# Patient Record
Sex: Male | Born: 1970
Health system: Southern US, Community
[De-identification: ages and names within clinical notes are randomized; demographics above are authoritative.]

## PROBLEM LIST (undated history)

## (undated) DIAGNOSIS — R45851 Suicidal ideations: Secondary | ICD-10-CM

## (undated) DIAGNOSIS — Z992 Dependence on renal dialysis: Secondary | ICD-10-CM

## (undated) DIAGNOSIS — I10 Essential (primary) hypertension: Secondary | ICD-10-CM

## (undated) DIAGNOSIS — R748 Abnormal levels of other serum enzymes: Secondary | ICD-10-CM

## (undated) DIAGNOSIS — E1165 Type 2 diabetes mellitus with hyperglycemia: Secondary | ICD-10-CM

## (undated) DIAGNOSIS — I447 Left bundle-branch block, unspecified: Secondary | ICD-10-CM

## (undated) DIAGNOSIS — J189 Pneumonia, unspecified organism: Secondary | ICD-10-CM

## (undated) DIAGNOSIS — E785 Hyperlipidemia, unspecified: Secondary | ICD-10-CM

## (undated) DIAGNOSIS — E559 Vitamin D deficiency, unspecified: Secondary | ICD-10-CM

## (undated) DIAGNOSIS — F419 Anxiety disorder, unspecified: Secondary | ICD-10-CM

## (undated) DIAGNOSIS — N289 Disorder of kidney and ureter, unspecified: Secondary | ICD-10-CM

## (undated) DIAGNOSIS — G4700 Insomnia, unspecified: Secondary | ICD-10-CM

## (undated) DIAGNOSIS — N186 End stage renal disease: Secondary | ICD-10-CM

## (undated) HISTORY — DX: Essential (primary) hypertension: I10

## (undated) HISTORY — PX: OTHER SURGICAL HISTORY: SHX169

## (undated) HISTORY — DX: Hyperlipidemia, unspecified: E78.5

## (undated) HISTORY — DX: Type 2 diabetes mellitus with hyperglycemia: E11.65

---
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
## 1998-11-16 ENCOUNTER — Emergency Department (HOSPITAL_COMMUNITY): Admission: EM | Admit: 1998-11-16 | Discharge: 1998-11-16 | Payer: Self-pay | Admitting: Emergency Medicine

## 1998-11-21 ENCOUNTER — Emergency Department (HOSPITAL_COMMUNITY): Admission: EM | Admit: 1998-11-21 | Discharge: 1998-11-21 | Payer: Self-pay | Admitting: Emergency Medicine

## 2002-08-05 ENCOUNTER — Emergency Department (HOSPITAL_COMMUNITY): Admission: EM | Admit: 2002-08-05 | Discharge: 2002-08-05 | Payer: Self-pay | Admitting: Emergency Medicine

## 2002-08-05 ENCOUNTER — Encounter: Payer: Self-pay | Admitting: Emergency Medicine

## 2003-09-19 ENCOUNTER — Emergency Department (HOSPITAL_COMMUNITY): Admission: EM | Admit: 2003-09-19 | Discharge: 2003-09-20 | Payer: Self-pay | Admitting: *Deleted

## 2003-11-06 ENCOUNTER — Emergency Department (HOSPITAL_COMMUNITY): Admission: EM | Admit: 2003-11-06 | Discharge: 2003-11-07 | Payer: Self-pay | Admitting: Emergency Medicine

## 2003-11-16 ENCOUNTER — Encounter: Admission: RE | Admit: 2003-11-16 | Discharge: 2003-11-16 | Payer: Self-pay | Admitting: Internal Medicine

## 2003-11-30 ENCOUNTER — Encounter: Admission: RE | Admit: 2003-11-30 | Discharge: 2003-11-30 | Payer: Self-pay | Admitting: Internal Medicine

## 2003-12-14 ENCOUNTER — Encounter: Admission: RE | Admit: 2003-12-14 | Discharge: 2003-12-14 | Payer: Self-pay | Admitting: Internal Medicine

## 2004-01-24 ENCOUNTER — Ambulatory Visit: Payer: Self-pay | Admitting: Internal Medicine

## 2004-02-14 ENCOUNTER — Ambulatory Visit: Payer: Self-pay | Admitting: Internal Medicine

## 2004-03-13 ENCOUNTER — Ambulatory Visit: Payer: Self-pay | Admitting: Internal Medicine

## 2004-03-29 ENCOUNTER — Ambulatory Visit: Payer: Self-pay | Admitting: Internal Medicine

## 2004-04-01 ENCOUNTER — Emergency Department (HOSPITAL_COMMUNITY): Admission: EM | Admit: 2004-04-01 | Discharge: 2004-04-01 | Payer: Self-pay | Admitting: Emergency Medicine

## 2004-04-03 ENCOUNTER — Emergency Department (HOSPITAL_COMMUNITY): Admission: EM | Admit: 2004-04-03 | Discharge: 2004-04-03 | Payer: Self-pay | Admitting: Family Medicine

## 2004-04-06 ENCOUNTER — Ambulatory Visit: Payer: Self-pay | Admitting: Internal Medicine

## 2004-04-20 ENCOUNTER — Ambulatory Visit: Payer: Self-pay | Admitting: Internal Medicine

## 2004-05-28 ENCOUNTER — Ambulatory Visit: Payer: Self-pay | Admitting: Internal Medicine

## 2004-06-04 ENCOUNTER — Ambulatory Visit: Payer: Self-pay | Admitting: Internal Medicine

## 2004-06-11 ENCOUNTER — Ambulatory Visit: Payer: Self-pay | Admitting: Internal Medicine

## 2004-07-24 ENCOUNTER — Ambulatory Visit: Payer: Self-pay | Admitting: Internal Medicine

## 2004-08-06 ENCOUNTER — Ambulatory Visit: Payer: Self-pay | Admitting: Internal Medicine

## 2004-08-23 ENCOUNTER — Ambulatory Visit: Payer: Self-pay | Admitting: Internal Medicine

## 2004-09-24 ENCOUNTER — Emergency Department (HOSPITAL_COMMUNITY): Admission: EM | Admit: 2004-09-24 | Discharge: 2004-09-24 | Payer: Self-pay | Admitting: Family Medicine

## 2004-10-24 ENCOUNTER — Ambulatory Visit: Payer: Self-pay | Admitting: Internal Medicine

## 2005-01-19 ENCOUNTER — Emergency Department (HOSPITAL_COMMUNITY): Admission: EM | Admit: 2005-01-19 | Discharge: 2005-01-19 | Payer: Self-pay | Admitting: Emergency Medicine

## 2005-01-31 ENCOUNTER — Ambulatory Visit: Payer: Self-pay | Admitting: Internal Medicine

## 2005-02-01 ENCOUNTER — Ambulatory Visit: Payer: Self-pay | Admitting: Internal Medicine

## 2005-06-04 ENCOUNTER — Emergency Department (HOSPITAL_COMMUNITY): Admission: EM | Admit: 2005-06-04 | Discharge: 2005-06-04 | Payer: Self-pay | Admitting: Emergency Medicine

## 2005-07-26 ENCOUNTER — Emergency Department (HOSPITAL_COMMUNITY): Admission: EM | Admit: 2005-07-26 | Discharge: 2005-07-26 | Payer: Self-pay | Admitting: Emergency Medicine

## 2006-07-17 ENCOUNTER — Encounter (INDEPENDENT_AMBULATORY_CARE_PROVIDER_SITE_OTHER): Payer: Self-pay | Admitting: Unknown Physician Specialty

## 2006-07-17 ENCOUNTER — Ambulatory Visit: Payer: Self-pay | Admitting: Hospitalist

## 2006-07-17 DIAGNOSIS — E1165 Type 2 diabetes mellitus with hyperglycemia: Secondary | ICD-10-CM

## 2006-07-17 DIAGNOSIS — N186 End stage renal disease: Secondary | ICD-10-CM | POA: Diagnosis present

## 2006-07-17 DIAGNOSIS — IMO0002 Reserved for concepts with insufficient information to code with codable children: Secondary | ICD-10-CM

## 2006-07-17 DIAGNOSIS — I10 Essential (primary) hypertension: Secondary | ICD-10-CM | POA: Diagnosis present

## 2006-07-17 DIAGNOSIS — Z992 Dependence on renal dialysis: Secondary | ICD-10-CM | POA: Diagnosis present

## 2006-07-17 DIAGNOSIS — E1142 Type 2 diabetes mellitus with diabetic polyneuropathy: Secondary | ICD-10-CM | POA: Insufficient documentation

## 2006-07-17 DIAGNOSIS — I152 Hypertension secondary to endocrine disorders: Secondary | ICD-10-CM | POA: Diagnosis present

## 2006-07-17 DIAGNOSIS — E119 Type 2 diabetes mellitus without complications: Secondary | ICD-10-CM

## 2006-07-17 HISTORY — DX: Type 2 diabetes mellitus with hyperglycemia: E11.65

## 2006-07-17 HISTORY — DX: Reserved for concepts with insufficient information to code with codable children: IMO0002

## 2006-07-17 HISTORY — DX: Essential (primary) hypertension: I10

## 2006-07-17 LAB — CONVERTED CEMR LAB
ALT: 18 units/L (ref 0–53)
AST: 18 units/L (ref 0–37)
Albumin: 4.3 g/dL (ref 3.5–5.2)
Alkaline Phosphatase: 127 units/L — ABNORMAL HIGH (ref 39–117)
BUN: 7 mg/dL (ref 6–23)
Blood Glucose, Fingerstick: 379
CO2: 26 meq/L (ref 19–32)
Calcium: 9.1 mg/dL (ref 8.4–10.5)
Chloride: 101 meq/L (ref 96–112)
Creatinine, Ser: 0.86 mg/dL (ref 0.40–1.50)
Glucose, Bld: 285 mg/dL — ABNORMAL HIGH (ref 70–99)
Hgb A1c MFr Bld: >14 %
Potassium: 4.1 meq/L (ref 3.5–5.3)
Sodium: 137 meq/L (ref 135–145)
Total Bilirubin: 0.3 mg/dL (ref 0.3–1.2)
Total Protein: 7.5 g/dL (ref 6.0–8.3)

## 2007-02-04 ENCOUNTER — Emergency Department (HOSPITAL_COMMUNITY): Admission: EM | Admit: 2007-02-04 | Discharge: 2007-02-04 | Payer: Self-pay | Admitting: Emergency Medicine

## 2007-05-25 ENCOUNTER — Emergency Department (HOSPITAL_COMMUNITY): Admission: EM | Admit: 2007-05-25 | Discharge: 2007-05-25 | Payer: Self-pay | Admitting: Emergency Medicine

## 2007-06-04 ENCOUNTER — Encounter (INDEPENDENT_AMBULATORY_CARE_PROVIDER_SITE_OTHER): Payer: Self-pay | Admitting: *Deleted

## 2007-06-04 ENCOUNTER — Ambulatory Visit: Payer: Self-pay | Admitting: Internal Medicine

## 2007-06-04 LAB — CONVERTED CEMR LAB
ALT: 18 units/L (ref 0–53)
AST: 19 units/L (ref 0–37)
Albumin: 4 g/dL (ref 3.5–5.2)
Alkaline Phosphatase: 107 units/L (ref 39–117)
BUN: 11 mg/dL (ref 6–23)
Blood Glucose, Fingerstick: 378
CO2: 23 meq/L (ref 19–32)
Calcium: 8.9 mg/dL (ref 8.4–10.5)
Chloride: 105 meq/L (ref 96–112)
Creatinine, Ser: 0.88 mg/dL (ref 0.40–1.50)
Creatinine, Urine: 55.9 mg/dL
Glucose, Bld: 285 mg/dL — ABNORMAL HIGH (ref 70–99)
Hgb A1c MFr Bld: 13.2 %
Microalb Creat Ratio: 3.6 mg/g (ref 0.0–30.0)
Microalb, Ur: 0.2 mg/dL (ref 0.00–1.89)
Potassium: 4.1 meq/L (ref 3.5–5.3)
Sodium: 139 meq/L (ref 135–145)
Total Bilirubin: 0.4 mg/dL (ref 0.3–1.2)
Total Protein: 7.2 g/dL (ref 6.0–8.3)

## 2007-07-22 ENCOUNTER — Emergency Department (HOSPITAL_COMMUNITY): Admission: EM | Admit: 2007-07-22 | Discharge: 2007-07-22 | Payer: Self-pay | Admitting: Emergency Medicine

## 2007-09-05 ENCOUNTER — Emergency Department (HOSPITAL_COMMUNITY): Admission: EM | Admit: 2007-09-05 | Discharge: 2007-09-05 | Payer: Self-pay | Admitting: Emergency Medicine

## 2008-04-21 ENCOUNTER — Ambulatory Visit: Payer: Self-pay | Admitting: Internal Medicine

## 2008-04-21 ENCOUNTER — Encounter (INDEPENDENT_AMBULATORY_CARE_PROVIDER_SITE_OTHER): Payer: Self-pay | Admitting: *Deleted

## 2008-04-21 LAB — CONVERTED CEMR LAB
ALT: 13 units/L (ref 0–53)
AST: 12 units/L (ref 0–37)
Albumin: 4.3 g/dL (ref 3.5–5.2)
Alkaline Phosphatase: 120 units/L — ABNORMAL HIGH (ref 39–117)
BUN: 11 mg/dL (ref 6–23)
Blood Glucose, Fingerstick: 338
CO2: 23 meq/L (ref 19–32)
Calcium: 9.5 mg/dL (ref 8.4–10.5)
Chloride: 101 meq/L (ref 96–112)
Cholesterol: 207 mg/dL — ABNORMAL HIGH (ref 0–200)
Creatinine, Ser: 0.9 mg/dL (ref 0.40–1.50)
Creatinine, Urine: 79.3 mg/dL
Glucose, Bld: 352 mg/dL — ABNORMAL HIGH (ref 70–99)
HDL: 33 mg/dL — ABNORMAL LOW (ref >39)
Hgb A1c MFr Bld: >14 %
LDL Cholesterol: 117 mg/dL — ABNORMAL HIGH (ref 0–99)
Microalb Creat Ratio: 6.6 mg/g (ref 0.0–30.0)
Microalb, Ur: 0.52 mg/dL (ref 0.00–1.89)
Potassium: 4.3 meq/L (ref 3.5–5.3)
Sodium: 139 meq/L (ref 135–145)
Total Bilirubin: 0.5 mg/dL (ref 0.3–1.2)
Total CHOL/HDL Ratio: 6.3
Total Protein: 8 g/dL (ref 6.0–8.3)
Triglycerides: 284 mg/dL — ABNORMAL HIGH (ref <150)
VLDL: 57 mg/dL — ABNORMAL HIGH (ref 0–40)

## 2008-06-14 ENCOUNTER — Emergency Department (HOSPITAL_COMMUNITY): Admission: EM | Admit: 2008-06-14 | Discharge: 2008-06-14 | Payer: Self-pay | Admitting: Emergency Medicine

## 2008-06-15 ENCOUNTER — Other Ambulatory Visit: Payer: Self-pay

## 2008-06-15 ENCOUNTER — Ambulatory Visit: Payer: Self-pay | Admitting: *Deleted

## 2008-06-15 ENCOUNTER — Other Ambulatory Visit: Payer: Self-pay | Admitting: Emergency Medicine

## 2008-06-15 ENCOUNTER — Observation Stay (HOSPITAL_COMMUNITY): Admission: AD | Admit: 2008-06-15 | Discharge: 2008-06-16 | Payer: Self-pay | Admitting: *Deleted

## 2008-09-26 ENCOUNTER — Encounter: Payer: Self-pay | Admitting: Internal Medicine

## 2008-11-24 ENCOUNTER — Encounter: Payer: Self-pay | Admitting: Internal Medicine

## 2008-11-24 ENCOUNTER — Ambulatory Visit: Payer: Self-pay | Admitting: Infectious Diseases

## 2008-11-24 DIAGNOSIS — E785 Hyperlipidemia, unspecified: Secondary | ICD-10-CM

## 2008-11-24 HISTORY — DX: Hyperlipidemia, unspecified: E78.5

## 2008-11-24 LAB — CONVERTED CEMR LAB
BUN: 7 mg/dL (ref 6–23)
CO2: 24 meq/L (ref 19–32)
Calcium: 9.3 mg/dL (ref 8.4–10.5)
Chloride: 90 meq/L — ABNORMAL LOW (ref 96–112)
Creatinine, Ser: 1.09 mg/dL (ref 0.40–1.50)
Glucose, Bld: 705 mg/dL (ref 70–99)
Hgb A1c MFr Bld: >14 %
Potassium: 3.9 meq/L (ref 3.5–5.3)
Sodium: 129 meq/L — ABNORMAL LOW (ref 135–145)

## 2008-12-16 ENCOUNTER — Emergency Department (HOSPITAL_COMMUNITY): Admission: EM | Admit: 2008-12-16 | Discharge: 2008-12-16 | Payer: Self-pay | Admitting: Emergency Medicine

## 2009-08-30 ENCOUNTER — Ambulatory Visit: Payer: Self-pay | Admitting: Internal Medicine

## 2010-01-11 ENCOUNTER — Emergency Department (HOSPITAL_COMMUNITY): Admission: EM | Admit: 2010-01-11 | Discharge: 2010-01-11 | Payer: Self-pay | Admitting: Family Medicine

## 2010-03-06 ENCOUNTER — Inpatient Hospital Stay (HOSPITAL_COMMUNITY)
Admission: EM | Admit: 2010-03-06 | Discharge: 2010-03-09 | Payer: Self-pay | Source: Home / Self Care | Admitting: Emergency Medicine

## 2010-06-19 NOTE — Assessment & Plan Note (Signed)
Summary: DIABETES/ SB.   Vital Signs:  Patient Profile:   40 Years Old Male Height:     67 inches (170.18 cm) Weight:      181.23 pounds (82.38 kg) BMI:     27.76 Temp:     98.1 degrees F (36.Douglas degrees C) oral Pulse rate:   80 / minute BP sitting:   119 / 74  (right arm)  Pt. in pain?   no  Vitals Entered By: Mollie Germany (June 04, 2007 10:15 AM)              Is Patient Diabetic? Yes  Nutritional Status BMI of 25 - 29 = overweight CBG Result 378  Does patient need assistance? Functional Status Self care Ambulation Normal     PCP:  Mohammed Kindle MD  Chief Complaint:  DM follow up and discuss meds.  History of Present Illness: [name] with DM presents for follow-up.  Last seen a year ago in our clinic.  He is unsure of when he was dx (says it was either 2003 or 2006).  Ran out of his oral meds more than 9-10 months ago.  Since that time, has been self-medicating with his wife's insulin using a sliding scale that he adapted from her physician's instructions.  He checks CBGs 3-4x/day using her meter and test strips.  Reports that his sugars are usually 300 to 400, but sometimes run in the high-100s or 200s.  Denies hypoglycemic episodes, but says that recently he was "really hungry," came home from school, and checked his sugar and found it to be 94.  No sweats/palpitations.  Denies CP, N/V/D, dyspnea, or abd pain. Feels well today.  Says he wants to get reconnected with medical care given the recent new year.  Denies HA, change in vision/blurriness, or numbness/tingling in his arms, legs, hands, or feet.  The pt does not believe he has ever had an eye exam.     Family History:    Mother: living, 81 - HTN, bladder cancer    Father: deceased, alcohol    Siblings: 3 brothers (1 with HTN), no sisters    Children: healthy  Social History:    Lives with wife in [place].  Is a cook.  Wife recently had foot amputation; is a type 1 DM.  Never smoked.  No alcohol.  No  drugs.   Risk Factors: Tobacco use:  never   Review of Systems  The patient denies anorexia, fever, weight loss, chest pain, syncope, dyspnea on exhertion, peripheral edema, prolonged cough, hemoptysis, abdominal pain, melena, hematochezia, severe indigestion/heartburn, hematuria, difficulty walking, and unusual weight change.     Physical Exam  General:     Alert, well-developed, well-nourished African-American man in no acute distress. Aqsa Sensabaugh:     Normocephalic and atraumatic. Eyes:     PERRLA.  EOMI.  Sclerae anicteric.  No conjunctival injection.  Fundoscopy limited 2/2 pupillary constriction. Mouth:     Good dentition.  Oropharynx clear, pink, and moist. Lungs:     Clear to auscultation bilaterally without wheezes, rales, or rhonchi. Heart:     Regular rate and rhythm without murmurs, rubs, or gallops. Abdomen:     Soft, non-tender, non-distended with normoactive bowel sounds.   Extremities:     No cyanosis or edema. Neurologic:     A&Ox3.  CN grossly intact.  Strength 5/5 in all extremities.  Sensation intact throughout upper and lower extremities to light touch.  No reported paresthesias/numbness/tingling. Skin:  Normal color and turgor. Psych:     Poor eye contact.  Somewhat withdrawn, but cooperative.  Limited insight into nature of his disease.    Impression & Recommendations:  Problem # 1:  DM (ICD-250.00) Very poor control with HgbA1c of 13.2%.  I had a lengthy discussion with the pt about his disease, importance of aggressively managing blood sugars, and obtaining his own insulin and testing supplies.  This raises some difficult medical/ethical issues, as he has access to very inexpensive supplies (his wife's, who has Medicaid) and I am unsure if he will be able to afford (or choose to pay for) the insulin, supplies, glucometer, and test strips that I wrote for.  I encouraged him to do so, however, since he needs his own supplies to personalize this disease and  help him take responsbility for his own care.  He has been using insulin for about 1 year now, and prefers it to oral meds, so we will try 70/30 (only because it is a cheaper alternative to Lantus).  I will see him back in 1-2 weeks to monitor his sugars and adjust his insulin as needed.  The "red flag" signs/sx of both hypoglycemia and DKA were discussed.  The following medications were removed from the medication list:    Metformin Hcl 500 Mg Tabs (Metformin hcl) .Marland Kitchen... Take 1 tablet by mouth two times a day    Glucotrol 10 Mg Tabs (Glipizide) .Marland Kitchen... Take 1 tablet by mouth two times a day    Lisinopril 20 Mg Tabs (Lisinopril) .Marland Kitchen... Take 1 tablet by mouth once a day  His updated medication list for this problem includes:    Novolin 70/30 70-30 % Susp (Insulin isophane & regular) ..... Inject 20 units in the morning before breakfast and 20 units in the evening before dinner.  Orders: T-Hgb A1C (in-house) [id number]) T- Capillary Blood Glucose [id number]) T-Urine Microalbumin w/creat. ratio [id number] / [id number]) T-Comprehensive Metabolic Panel (A999333) Ophthalmology Referral (Ophthalmology)  Labs Reviewed: HgBA1c: 13.2 (06/04/2007)   Creat: 0.86 (07/17/2006)      Problem # 2:  ESSENTIAL HYPERTENSION (ICD-401.9) The pt has documented proteinuria previously, but his BP today is at goal (<130/80).  Will hold on restarting lisinopril today, as pt "can only handle so many medicines" [at one time].  Will recheck BP at next visit and consider starting low-dose lisinopril if BP can tolerate.  The following medications were removed from the medication list:    Lisinopril 20 Mg Tabs (Lisinopril) .Marland Kitchen... Take 1 tablet by mouth once a day  Orders: T-Urine Microalbumin w/creat. ratio [id number] / [id number]) T-Comprehensive Metabolic Panel (A999333)   Complete Medication List: 1)  Novolin 70/30 70-30 % Susp (Insulin isophane & regular) .... Inject 20 units in the morning before breakfast and 20 units  in the evening before dinner.   Patient Instructions: 1)  Please make a follow-up appointment in 2-3 weeks. 2)  Please continue to keep track of your blood sugars as you have now been started on insulin. 3)  If you have problems with low blood sugar (sweating, excessive hunger, shaking), please call us. 4)  If you develop nausea, vomiting, or pain in your stomach, please call us (if during the day) or come to the Emergency Room (if after hours).    Prescriptions: NOVOLIN 70/30 70-30 %  SUSP (INSULIN ISOPHANE & REGULAR) Inject 20 units in the morning before breakfast and 20 units in the evening before dinner.  #2 vials x 11   Entered and Authorized by:  Dorina Hoyer MD   Signed by:   Dorina Hoyer MD on 06/04/2007   Method used:   Print then Give to Patient   RxID:   [id number]  ] Laboratory Results   Blood Tests   Date/Time Recieved: June 04, 2007 10:30 AM Date/Time Reported: ..................................................................Marland KitchenMaryan Rued  June 04, 2007 10:30 AM  HGBA1C: 13.2%   (Normal Range: Non-Diabetic - 3-6%   Control Diabetic - 6-8%) CBG Random: 378

## 2010-06-19 NOTE — Letter (Signed)
Summary: Diabetic Foot Exam  Diabetic Foot Exam   Imported By: Ollen Bowl 07/23/2006 10:27:06  _____________________________________________________________________  External Attachment:    Type:   Image     Comment:   External Document

## 2010-06-19 NOTE — Miscellaneous (Signed)
Summary: HIPAA Restrictions  HIPAA Restrictions   Imported By: Bonner Puna 11/24/2008 16:28:55  _____________________________________________________________________  External Attachment:    Type:   Image     Comment:   External Document

## 2010-06-19 NOTE — Assessment & Plan Note (Signed)
Summary: EYES / SUGAR IS HIGH/ SB.   Vital Signs:  Patient Profile:   40 Years Old Male Weight:      175.4 pounds (79.73 kg) Temp:     98.2 degrees F (36.78 degrees C) oral Pulse rate:   86 / minute BP sitting:   136 / 82  (right arm)  Pt. in pain?   no  Vitals Entered By: Hilda Blades Ditzler RN (July 17, 2006 3:05 PM)              Is Patient Diabetic? Yes  Nutritional Status Normal Nutritional Status Detail good CBG Result 379  Have you ever been in a relationship where you felt threatened, hurt or afraid?denies   Does patient need assistance? Functional Status Self care Ambulation Normal   PCP:  [id number]  Chief Complaint:  c/o of "eye h/a"off and on for past 3-4 months.  History of Present Illness: Pt is a 40yr old gentleman with h/o DM, HTN and non-compliance. Pt is here in the clinic after 39mths. He takes his meds sporadically. He is on Glucophage and Glucotrol for DM and Natasha in the clinic has gotten his meds, but he did not come to pick it up. He says he has ran out of Lisinopril 1 mth ago. He says 2 days ago he had problem with his eyes, like hazy, and felt tired. He does not check his sugar at home. He has h/o polyuria, polydypsia and nocturia. No CP, SOB, HA, focal weakness, dysuria.  Prior Medications: METFORMIN HCL 500 MG TABS (METFORMIN HCL) Take 1 tablet by mouth two times a day GLUCOTROL 10 MG TABS (GLIPIZIDE) Take 1 tablet by mouth two times a day LISINOPRIL 20 MG TABS (LISINOPRIL) Take 1 tablet by mouth once a day     Risk Factors:  Tobacco use:  never    Physical Exam  General:     alert, well-developed, well-nourished, and well-hydrated.   Head:     normocephalic.   Eyes:     No corneal or conjunctival inflammation noted. EOMI. Perrla. Funduscopic exam benign, without hemorrhages, exudates or papilledema. Vision grossly normal. Mouth:     fair dentition and halitosis.   Neck:     supple and no thyromegaly.   Lungs:     normal  respiratory effort, normal breath sounds, no crackles, and no wheezes.   Heart:     normal rate, regular rhythm, no murmur, and no gallop.   Abdomen:     soft, non-tender, and normal bowel sounds.   Extremities:     No clubbing, cyanosis, edema, or deformity noted with normal full range of motion of all joints.   +2 pulses, sensation intact. Neurologic:     Non-focal    Impression & Recommendations:  Problem # 1:  DM (ICD-250.00) CBG 379 and HbA1c >14, it was 10.7 in 2006. Pt has not been seen since then. He says he has abdominal pain when he takes his meds so would like new meds. I explained to him that if he takes them regularly, he might be able to wear of the side effects which are seen by Glucophage in early period. He understands and says will try to take them reg. I explained the consequences of uncontrolled DM. Orders: T- Capillary Blood Glucose [id number]) T-Hgb A1C (in-house) [id number]) T-Comprehensive Metabolic Panel (A999333)  His updated medication list for this problem includes:    Metformin Hcl 500 Mg Tabs (Metformin hcl) .Marland Kitchen... Take 1 tablet by mouth two times a  day    Glucotrol 10 Mg Tabs (Glipizide) .Marland Kitchen... Take 1 tablet by mouth two times a day    Lisinopril 20 Mg Tabs (Lisinopril) .Marland Kitchen... Take 1 tablet by mouth once a day  CMET normal.   Medications Added to Medication List This Visit: 1)  Metformin Hcl 500 Mg Tabs (Metformin hcl) .... Take 1 tablet by mouth two times a day 2)  Glucotrol 10 Mg Tabs (Glipizide) .... Take 1 tablet by mouth two times a day 3)  Lisinopril 20 Mg Tabs (Lisinopril) .... Take 1 tablet by mouth once a day   Patient Instructions: 1)  Limit intake of Sodium (Salt). 2)  Please schedule a follow-up appointment in 2 weeks. 3)  Diabetes can make you handicap, destroy your kidnesys, heart and brain. It can also affect your vision by giving blood in your eyes. So it is important you take your med. 4)  Keep your apt with Barnabas Harries.  Laboratory  Results   Blood Tests   Date/Time Recieved: July 17, 2006 3:27 PM  Date/Time Reported: July 17, 2006 3:27 PM ..................................................................Marland KitchenMelvia Heaps  July 17, 2006 3:27 PM   HGBA1C: >14.0%   (Normal Range: Non-Diabetic - 3-6%   Control Diabetic - 6-8%) CBG Random: 379

## 2010-06-19 NOTE — Assessment & Plan Note (Signed)
Summary: diabetes check/up/cfb   Primary Care Provider:  Geanie Kenning MD   History of Present Illness: pt came late by 2 hrs to clinic appointment and then disappeared from waiting area when asked to wait until I am done with other pts.   Allergies: No Known Drug Allergies   Complete Medication List: 1)  Novolin 70/30 70-30 % Susp (Insulin isophane & regular) .... Inject 20 units in the morning before breakfast and 20 units in the evening before dinner. 2)  Pravastatin Sodium 10 Mg Tabs (Pravastatin sodium) .... Take 1 tablet by mouth once a day

## 2010-06-19 NOTE — Assessment & Plan Note (Signed)
Summary: ACUTE-DIABETES CK NEEDING MEDS/CFB   Vital Signs:  Patient Profile:   40 Years Old Male Height:     67 inches (170.18 cm) Weight:      166.6 pounds (75.73 kg) BMI:     26.19 Temp:     97.9 degrees F (36.61 degrees C) oral Pulse rate:   73 / minute BP sitting:   110 / 78  (left arm) Cuff size:   large  Pt. in pain?   no  Vitals Entered By: Nadine Counts Deborra Medina) (April 21, 2008 9:51 AM)              Is Patient Diabetic? Yes Did you bring your meter with you today? No Nutritional Status BMI of 25 - 29 = overweight CBG Result 338  Have you ever been in a relationship where you felt threatened, hurt or afraid?No   Does patient need assistance? Functional Status Self care Ambulation Normal     PCP:  Mohammed Kindle MD  Chief Complaint:  f/u diabetes.  History of Present Illness: Pt is 40 yo with terribly uncontrolled diabetes, diagnosed  ~ 3 y ago, and severe medical non-compliance. Pt reports having run out of insulin about 2-3 months ago. When he did have his insulin, he attempted some sort of sliding scale dosing in which he took up to 50 units of his 70/30 at a time. However, it sounds like he usually only dosed himself once a day.  Pt reports taking BS 2x day fairly regularly using wife's testing equipment as she is also a diabetic.  He did not bring any of his BS recordings with him. He reports that while on the insulin, his pre-prandial BS would range from 200s to 400s. Not currently having any symptoms related to hyperglycemia and could not remember a hypoglycemic spell.     Current Allergies (reviewed today): No known allergies     Risk Factors: Tobacco use:  never    Physical Exam  General:     alert and well-developed.   Head:     normocephalic and atraumatic.   Lungs:     normal respiratory effort and no accessory muscle use.   Extremities:     no foot ulcers or skin breakdown on feet.  Neurologic:     alert & oriented X3.     Diabetes Management Exam:    Foot Exam (with socks and/or shoes not present):       Sensory-Monofilament:          Left foot: normal          Right foot: normal    Impression & Recommendations:  Problem # 1:  DM (ICD-250.00) Severely uncontrolled. D/w pt at length about potential complications including renal, CV, infections, neuropathic that would likely befall him if he does not make strides toward adequately controlling his diabetes. Also informed him that this disease would likely shorten his life span significantly. I believe that he understood what I was saying. D/w pt also need to just take 20 units in am and pm and record BS at least twice a day each and every day so that we would have an adequate baseline to make further adjustments to his regimen.   We scheduled him to f/u in two weeks after he has restarted insulin and has recorded his CBGs. We also set him up for an Opth appt for a diabetic eye exam and to meet with Barnabas Harries. We Also had Natasha visit pt to help  him obtain insulin. She reports that she has attempted to work with him on multiple occasions in the past, however, he has been non-compliant.   We also risk stratified him by obtaining an FLP and checked a CMET to evaluate for any renal or liver dysfunction. We will also check his Cr/ microalbumin ratio.   His updated medication list for this problem includes:    Novolin 70/30 70-30 % Susp (Insulin isophane & regular) ..... Inject 20 units in the morning before breakfast and 20 units in the evening before dinner.  Orders: T-Hgb A1C (in-house) [id number]) T- Capillary Blood Glucose [id number]) T-Lipid Profile [id number]) T-Comprehensive Metabolic Panel (A999333) T-Urine Microalbumin w/creat. ratio [id number] / [id number])  Labs Reviewed: HgBA1c: >14.0 (04/21/2008)   Creat: 0.88 (06/04/2007)   Microalbumin: 0.20 (06/04/2007)   Problem # 2:  ESSENTIAL HYPERTENSION (ICD-401.9) BP at goal on no medications. Will  continue to follow.   BP today: 110/78 Prior BP: 119/74 (06/04/2007)  Labs Reviewed: Creat: 0.88 (06/04/2007)   Complete Medication List: 1)  Novolin 70/30 70-30 % Susp (Insulin isophane & regular) .... Inject 20 units in the morning before breakfast and 20 units in the evening before dinner.  Other Orders: Diabetic Clinic Referral (Diabetic) Ophthalmology Referral (Ophthalmology)   Patient Instructions: 1)  Please schedule a follow-up appointment in 2 weeks.  2)  Please record your blood sugars twice a day for each day over the next two weeks. Please just take 20 units of your insulin in the morning and evening as directed. We need you to strictly follow this so that we can know how much we need to adjust your insulin. 3)  We will let you know of your blood work results by phone.   Prescriptions: NOVOLIN 70/30 70-30 %  SUSP (INSULIN ISOPHANE & REGULAR) Inject 20 units in the morning before breakfast and 20 units in the evening before dinner.  #2 vials x 11   Entered and Authorized by:   Darlyne Russian MD   Signed by:   Darlyne Russian MD on 04/21/2008   Method used:   Print then Give to Patient   [id number]  ]         Diabetic Foot Exam Foot Inspection Is there a history of a foot ulcer?              No Is there a foot ulcer now?              No Can the patient see the bottom of their feet?          Yes Is there swelling or an abnormal foot shape?          No Are the toenails long?                No Are the toenails thick?                No Are the toenails ingrown?              No Is there heavy callous build-up?              No Is there pain in the calf muscle (Intermittent claudication) when walking?    No Diabetic Foot Care Education Patient educated on appropriate care of diabetic feet.     10-g (5.07) Semmes-Weinstein Monofilament Test Performed by: Maxine Glenn          Right Foot          Left  Foot Site 1         normal         normal Site 4          normal         normal Site 5         normal         normal Site 6         normal         normal  Impression      normal         normal   Laboratory Results   Blood Tests   Date/Time Received: April 21, 2008 10:07 AM. Date/Time Reported: Maryan Rued  April 21, 2008 10:07 AM  HGBA1C: >14.0%   (Normal Range: Non-Diabetic - 3-6%   Control Diabetic - 6-8%) CBG Random:: 338mg /dL

## 2010-06-19 NOTE — Assessment & Plan Note (Signed)
Summary: ACUTE-DIABETES CHECK UP/CFB   Vital Signs:  Patient profile:   40 year old male Height:      67.5 inches (171.45 cm) Weight:      169.0 pounds (76.82 kg) BMI:     26.17 Temp:     97.1 degrees F (36.17 degrees C) Pulse rate:   91 / minute BP sitting:   135 / 84  (right arm)  Vitals Entered By: Nadine Counts Deborra Medina) (November 24, 2008 1:27 PM) CC: med refill, f/u DM Is Patient Diabetic? Yes  Pain Assessment Patient in pain? no      Nutritional Status BMI of 25 - 29 = overweight  Have you ever been in a relationship where you felt threatened, hurt or afraid?No   Does patient need assistance? Functional Status Self care Ambulation Normal   Primary Care Provider:  Geanie Kenning MD  CC:  med refill and f/u DM.  History of Present Illness: Pt is 40 yo with poorly controlled diabetes diagnosed  ~ 5 years ago, HTN, and medical non-compliance because he can not afford insulin. Pt reports that he has run out of insulin (70/30) about 1 month ago because you lost his job and could not afford insulin. When he had his insulin, he checked his CBG usually about 300. During the past one month, he has not checked CBG. Now he has polydipsia, polyuria, but no diarrhea, dysuria or blurry vision, HA, dizziness, fever, chest pain, sob. Denies use of any drugs.   Preventive Screening-Counseling & Management  Alcohol-Tobacco     Smoking Status: never  Problems Prior to Update: 1)  Essential Hypertension  (ICD-401.9) 2)  Dm  (ICD-250.00)  Current Problems (verified): 1)  Hyperlipidemia  (ICD-272.4) 2)  Essential Hypertension  (ICD-401.9) 3)  Dm  (ICD-250.00)  Medications Prior to Update: 1)  Novolin 70/30 70-30 %  Susp (Insulin Isophane & Regular) .... Inject 20 Units in The Morning Before Breakfast and 20 Units in The Evening Before Dinner.  Current Medications (verified): 1)  Novolin 70/30 70-30 %  Susp (Insulin Isophane & Regular) .... Inject 20 Units in The Morning Before  Breakfast and 20 Units in The Evening Before Dinner. 2)  Pravastatin Sodium 10 Mg Tabs (Pravastatin Sodium) .... Take 1 Tablet By Mouth Once A Day  Allergies (verified): No Known Drug Allergies  Past History:  Family History: Last updated: 06/04/2007 Mother: living, 46 - HTN, bladder cancer Father: deceased, alcohol Siblings: 3 brothers (1 with HTN), no sisters Children: healthy  Social History: Last updated: 11/24/2008 Lives with wife in [place].  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  Wife recently had foot amputation; is a type 1 DM.  Never smoked.  No alcohol.  No drugs.  Risk Factors: Smoking Status: never (11/24/2008)  Family History: Reviewed history from 06/04/2007 and no changes required. Mother: living, 47 - HTN, bladder cancer Father: deceased, alcohol Siblings: 3 brothers (1 with HTN), no sisters Children: healthy  Social History: Reviewed history from 06/04/2007 and no changes required. Lives with wife in [place].  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  Wife recently had foot amputation; is a type 1 DM.  Never smoked.  No alcohol.  No drugs.  Review of Systems       See HPI  Physical Exam  General:  alert, well-developed, and well-nourished.   Head:  normocephalic.   Eyes:  vision grossly intact, pupils equal, pupils round, and pupils reactive to light.   Ears:  no external deformities.  Nose:  no external erythema and no nasal discharge.   Mouth:  no gingival abnormalities and no dental plaque.   Neck:  supple.   Chest Wall:  no deformities.   Lungs:  normal respiratory effort, normal breath sounds, no crackles, and no wheezes.   Heart:  normal rate, regular rhythm, no murmur, and no JVD.   Abdomen:  soft, non-tender, and normal bowel sounds.   Msk:  normal ROM, no joint tenderness, no joint swelling, no joint warmth, and no redness over joints.   Extremities:  No clubbing, cyanosis, edema, or deformity noted with normal full range of motion of all joints.     Neurologic:  alert & oriented X3, cranial nerves II-XII intact, strength normal in all extremities, sensation intact to light touch, and gait normal.     Impression & Recommendations:  Problem # 1:  DM (ICD-250.00) Assessment Deteriorated He has poorly controlled DM due to run out of insulin for a month. Now current CBG >600 and A1C >14. Because he has no other precipitating factors other than not use of insulin, his BMET is unremarkable except glucose 705, no symptoms change during the past month, at this time we think this has been chronic over one month, we can treat him outpatient by giving him insulin and closely monitor CBG and adjust insulin dosing. So we have gave him novolog 10 units Subcutaneously injection before he left  and gave a vial of 1000 units Novolin 70/30 as well as syringe to keep him at least two weeks of supply. We have told him that he may increase insulin dose based on his CBG values and may come back to Clinic or ED if CBG continues high with use of insulin. He clearly understans these and would like to treat outpatient for now. We want him to return to [facility] early next week to check his CBG and BMET. Will also need for eye exam and diabetic referral for education.   Marland Kitchen His updated medication list for this problem includes:    Novolin 70/30 70-30 % Susp (Insulin isophane & regular) ..... Inject 20 units in the morning before breakfast and 20 units in the evening before dinner.  Orders: T- Capillary Blood Glucose [id number]) T-Hgb A1C (in-house) [phone]) Ophthalmology Referral (Ophthalmology) T-Basic Metabolic Panel (99991111) T-Lipid Profile [id number]) Insulin 5 units [id number]) Diabetic Clinic Referral (Diabetic)Future Orders: T-Basic Metabolic Panel (99991111) ... 11/29/2008  Problem # 2:  ESSENTIAL HYPERTENSION (ICD-401.9) Assessment: Deteriorated Current BP 135/84, slightly above the goal. His past BP is at the goal, so considering his high CBG nlow, will not  add any BP medications now, will check at next visit. May add ACEIs if still high.  Problem # 3:  HYPERLIPIDEMIA ([id number]) Assessment: Comment Only His LDL is above the goal , will add pravastatin. Check FLP in about 3 months.  His updated medication list for this problem includes:    Pravastatin Sodium 10 Mg Tabs (Pravastatin sodium) .Marland Kitchen... Take 1 tablet by mouth once a day  Last Lipid ProfileCholesterol: 207 (04/21/2008 8:33:00 PM)HDL:  33 (04/21/2008 8:33:00 PM)LDL:  117 (04/21/2008 8:33:00 PM)Triglycerides:  Last Liver profileSGOT:  12 (04/21/2008 8:33:00 PM)SPGT:  13 (04/21/2008 8:33:00 PM)T. Bili:  0.5 (04/21/2008 8:33:00 PM)Alk Phos:  120 (04/21/2008 8:33:00 PM)  Medications Added to Medication List This Visit: 1)  Pravastatin Sodium 10 Mg Tabs (Pravastatin sodium) .... Take 1 tablet by mouth once a day  Complete Medication List: 1)  Novolin 70/30 70-30 % Susp (Insulin isophane & regular) .Marland KitchenMarland KitchenMarland Kitchen  Inject 20 units in the morning before breakfast and 20 units in the evening before dinner. 2)  Pravastatin Sodium 10 Mg Tabs (Pravastatin sodium) .... Take 1 tablet by mouth once a day  Patient Instructions: 1)  Please schedule a follow-up appointment early next week. 2)  Please use insulin as instructed.  3)  Please call us if you run out of insulin and follow the appointments. 4)  Please come to the Clinic or ED if you still have very high blood sugar or have new symptoms such as chest pain, mental status change or fever. 5)  Please check BMET before next visit. Prescriptions: PRAVASTATIN SODIUM 10 MG TABS (PRAVASTATIN SODIUM) Take 1 tablet by mouth once a day  #30 x 6   Entered and Authorized by:   Geanie Kenning MD   Signed by:   Geanie Kenning MD on 11/25/2008   Method used:   Print then Give to Patient   RxID:   [id number]   Prevention & Chronic Care Immunizations   Influenza vaccine: Not documented   Influenza vaccine due: 01/18/2009    Tetanus booster: Not  documented   Td booster deferral: Deferred  (11/24/2008)   Tetanus booster due: 06/15/2018    Pneumococcal vaccine: Not documented  Other Screening    Smoking status: never  (11/24/2008)  Diabetes Mellitus   HgbA1C: >14.0  (11/24/2008)    Eye exam: Not documented   Diabetic eye exam action/deferral: Ophthalmology referral  (11/24/2008)    Foot exam: yes  (04/21/2008)   High risk foot: Not documented   Foot care education: Done  (04/21/2008)   Foot exam due: 04/21/2009    Urine microalbumin/creatinine ratio: 6.6  (04/21/2008)    Diabetes flowsheet reviewed?: Yes   Progress toward A1C goal: Unchanged  Hypertension   Last Blood Pressure: 135 / 84  (11/24/2008)   Serum creatinine: 0.90  (04/21/2008)   Serum potassium 4.3  (04/21/2008)    Hypertension flowsheet reviewed?: Yes   Progress toward BP goal: Deteriorated  Lipids   Total Cholesterol: 207  (04/21/2008)   Lipid panel action/deferral: Lipid Panel ordered   LDL: 117  (04/21/2008)   LDL Direct: Not documented   HDL: 33  (04/21/2008)   Triglycerides: 284  (04/21/2008)    SGOT (AST): 12  (04/21/2008)   SGPT (ALT): 13  (04/21/2008)   Alkaline phosphatase: 120  (04/21/2008)   Total bilirubin: 0.5  (04/21/2008)    Lipid flowsheet reviewed?: Yes   Progress toward LDL goal: Unchanged  Self-Management Support :   Personal Goals (by the next clinic visit) :     Personal A1C goal: 7  (11/24/2008)     Personal blood pressure goal: 130/80  (11/24/2008)     Personal LDL goal: 100  (11/24/2008)    Patient will work on the following items until the next clinic visit to reach self-care goals:     Medications and monitoring: take my medicines every day, check my blood sugar  (11/24/2008)     Eating: drink diet soda or water instead of juice or soda, eat foods that are low in salt  (11/24/2008)    Diabetes self-management support: Written self-care plan, Education handout  (11/24/2008)   Diabetes care plan printed    Diabetes education handout printed   Referred for diabetes self-mgmt training.    Hypertension self-management support: Written self-care plan  (11/24/2008)   Hypertension self-care plan printed.    Lipid self-management support: Written self-care plan  (11/24/2008)   Lipid self-care plan  printed.   Nursing Instructions: Refer for screening diabetic eye exam (see order)   Laboratory Results   Blood Tests   Date/Time Received: November 24, 2008 1:46 PM  Date/Time Reported: Melvia Heaps  November 24, 2008 1:46 PM   HGBA1C: >14.0%   (Normal Range: Non-Diabetic - 3-6%   Control Diabetic - 6-8%)  Comments: CBG Critical High Result Results given to Aquilla Hacker. CMA and to Dr. Ronny Flurry Stat BMP drawn Melvia Heaps  November 24, 2008 1:47 PM       Medication Administration  Injection # 1:    Medication: Insulin 5 units    Diagnosis: DM (ICD-250.00)    Route: SQ    Site: ABD    Exp Date: 06/2010    Lot #: [id number]    Mfr: NOVO NORDISK    Comments: PT GIVEN NOVOLIN REGULAR 10 UNITS RIGHT LOWER ABD    Patient tolerated injection without complications    Given by: Nadine Counts Deborra Medina) (November 24, 2008 2:30 PM)  Orders Added: 1)  T- Capillary Blood Glucose [[id number]] 2)  T-Hgb A1C (in-house) [id number] 3)  Ophthalmology Referral [Ophthalmology] 4)  T-Basic Metabolic Panel 0000000 5)  T-Lipid Profile [[id number]] 6)  Est. Patient Level IV [id number] 7)  Insulin 5 units [J1815] 8)  Diabetic Clinic Referral [Diabetic] 9)  T-Basic Metabolic Panel 0000000

## 2010-06-19 NOTE — Miscellaneous (Signed)
Summary: [facility]  [facility]   Imported By: Bonner Puna 09/26/2008 15:06:45  _____________________________________________________________________  External Attachment:    Type:   Image     Comment:   External Document

## 2010-06-19 NOTE — Letter (Signed)
Summary: Handout Printed  Printed Handout:  - *Patient Instructions 

## 2010-08-01 LAB — GLUCOSE, CAPILLARY
Glucose-Capillary: 129 mg/dL — ABNORMAL HIGH (ref 70–99)
Glucose-Capillary: 133 mg/dL — ABNORMAL HIGH (ref 70–99)
Glucose-Capillary: 141 mg/dL — ABNORMAL HIGH (ref 70–99)
Glucose-Capillary: 200 mg/dL — ABNORMAL HIGH (ref 70–99)
Glucose-Capillary: 227 mg/dL — ABNORMAL HIGH (ref 70–99)
Glucose-Capillary: 234 mg/dL — ABNORMAL HIGH (ref 70–99)
Glucose-Capillary: 255 mg/dL — ABNORMAL HIGH (ref 70–99)
Glucose-Capillary: 288 mg/dL — ABNORMAL HIGH (ref 70–99)
Glucose-Capillary: 290 mg/dL — ABNORMAL HIGH (ref 70–99)
Glucose-Capillary: 307 mg/dL — ABNORMAL HIGH (ref 70–99)
Glucose-Capillary: 308 mg/dL — ABNORMAL HIGH (ref 70–99)
Glucose-Capillary: 349 mg/dL — ABNORMAL HIGH (ref 70–99)
Glucose-Capillary: 360 mg/dL — ABNORMAL HIGH (ref 70–99)
Glucose-Capillary: 368 mg/dL — ABNORMAL HIGH (ref 70–99)
Glucose-Capillary: 378 mg/dL — ABNORMAL HIGH (ref 70–99)
Glucose-Capillary: 64 mg/dL — ABNORMAL LOW (ref 70–99)

## 2010-08-01 LAB — POCT I-STAT, CHEM 8
BUN: 7 mg/dL (ref 6–23)
Calcium, Ion: 1.15 mmol/L (ref 1.12–1.32)
Chloride: 103 mEq/L (ref 96–112)
Creatinine, Ser: 0.8 mg/dL (ref 0.4–1.5)
Glucose, Bld: 350 mg/dL — ABNORMAL HIGH (ref 70–99)
HCT: 47 % (ref 39.0–52.0)
Hemoglobin: 16 g/dL (ref 13.0–17.0)
Potassium: 4 mEq/L (ref 3.5–5.1)
Sodium: 139 mEq/L (ref 135–145)
TCO2: 25 mmol/L (ref 0–100)

## 2010-08-26 LAB — GLUCOSE, CAPILLARY: Glucose-Capillary: >600 mg/dL (ref 70–99)

## 2010-08-26 LAB — WOUND CULTURE

## 2010-09-03 LAB — COMPREHENSIVE METABOLIC PANEL
ALT: 14 U/L (ref 0–53)
ALT: 16 U/L (ref 0–53)
AST: 17 U/L (ref 0–37)
AST: 21 U/L (ref 0–37)
Albumin: 2.7 g/dL — ABNORMAL LOW (ref 3.5–5.2)
Albumin: 3.2 g/dL — ABNORMAL LOW (ref 3.5–5.2)
Alkaline Phosphatase: 85 U/L (ref 39–117)
Alkaline Phosphatase: 116 U/L (ref 39–117)
BUN: 3 mg/dL — ABNORMAL LOW (ref 6–23)
BUN: 6 mg/dL (ref 6–23)
CO2: 29 mEq/L (ref 19–32)
CO2: 28 mEq/L (ref 19–32)
Calcium: 8.2 mg/dL — ABNORMAL LOW (ref 8.4–10.5)
Calcium: 8.8 mg/dL (ref 8.4–10.5)
Chloride: 103 mEq/L (ref 96–112)
Chloride: 91 mEq/L — ABNORMAL LOW (ref 96–112)
Creatinine, Ser: 0.73 mg/dL (ref 0.4–1.5)
Creatinine, Ser: 0.93 mg/dL (ref 0.4–1.5)
GFR calc Af Amer: >60 mL/min (ref >60)
GFR calc Af Amer: >60 mL/min (ref >60)
GFR calc non Af Amer: >60 mL/min (ref >60)
GFR calc non Af Amer: >60 mL/min (ref >60)
Glucose, Bld: 224 mg/dL — ABNORMAL HIGH (ref 70–99)
Glucose, Bld: 540 mg/dL (ref 70–99)
Potassium: 3.4 mEq/L — ABNORMAL LOW (ref 3.5–5.1)
Potassium: 3.6 mEq/L (ref 3.5–5.1)
Sodium: 136 mEq/L (ref 135–145)
Sodium: 128 mEq/L — ABNORMAL LOW (ref 135–145)
Total Bilirubin: 0.8 mg/dL (ref 0.3–1.2)
Total Bilirubin: 0.7 mg/dL (ref 0.3–1.2)
Total Protein: 6.5 g/dL (ref 6.0–8.3)
Total Protein: 6.8 g/dL (ref 6.0–8.3)

## 2010-09-03 LAB — CARDIAC PANEL(CRET KIN+CKTOT+MB+TROPI)
CK, MB: 1.1 ng/mL (ref 0.3–4.0)
CK, MB: 1.2 ng/mL (ref 0.3–4.0)
Relative Index: 0.9 (ref 0.0–2.5)
Relative Index: 1 (ref 0.0–2.5)
Total CK: 108 U/L (ref 7–232)
Total CK: 139 U/L (ref 7–232)
Troponin I: <0.01 ng/mL (ref 0.00–0.06)
Troponin I: <0.01 ng/mL (ref 0.00–0.06)

## 2010-09-03 LAB — DIFFERENTIAL
Basophils Absolute: 0 10*3/uL (ref 0.0–0.1)
Basophils Absolute: 0 10*3/uL (ref 0.0–0.1)
Basophils Relative: 0 % (ref 0–1)
Basophils Relative: 0 % (ref 0–1)
Eosinophils Absolute: 0.1 10*3/uL (ref 0.0–0.7)
Eosinophils Absolute: 0 10*3/uL (ref 0.0–0.7)
Eosinophils Relative: 1 % (ref 0–5)
Eosinophils Relative: 0 % (ref 0–5)
Lymphocytes Relative: 27 % (ref 12–46)
Lymphocytes Relative: 11 % — ABNORMAL LOW (ref 12–46)
Lymphs Abs: 2.2 10*3/uL (ref 0.7–4.0)
Lymphs Abs: 1 10*3/uL (ref 0.7–4.0)
Monocytes Absolute: 1.2 10*3/uL — ABNORMAL HIGH (ref 0.1–1.0)
Monocytes Absolute: 0.5 10*3/uL (ref 0.1–1.0)
Monocytes Relative: 14 % — ABNORMAL HIGH (ref 3–12)
Monocytes Relative: 6 % (ref 3–12)
Neutro Abs: 4.8 10*3/uL (ref 1.7–7.7)
Neutro Abs: 7.1 10*3/uL (ref 1.7–7.7)
Neutrophils Relative %: 58 % (ref 43–77)
Neutrophils Relative %: 83 % — ABNORMAL HIGH (ref 43–77)

## 2010-09-03 LAB — URINE CULTURE
Colony Count: NO GROWTH
Culture: NO GROWTH

## 2010-09-03 LAB — CULTURE, BLOOD (ROUTINE X 2)
Culture: NO GROWTH
Culture: NO GROWTH

## 2010-09-03 LAB — CBC
HCT: 37.1 % — ABNORMAL LOW (ref 39.0–52.0)
HCT: 44.3 % (ref 39.0–52.0)
Hemoglobin: 12.2 g/dL — ABNORMAL LOW (ref 13.0–17.0)
Hemoglobin: 14.6 g/dL (ref 13.0–17.0)
MCHC: 32.9 g/dL (ref 30.0–36.0)
MCHC: 33 g/dL (ref 30.0–36.0)
MCV: 83.7 fL (ref 78.0–100.0)
MCV: 85.4 fL (ref 78.0–100.0)
Platelets: 266 10*3/uL (ref 150–400)
Platelets: 252 10*3/uL (ref 150–400)
RBC: 4.43 MIL/uL (ref 4.22–5.81)
RBC: 5.19 MIL/uL (ref 4.22–5.81)
RDW: 12.8 % (ref 11.5–15.5)
RDW: 12.8 % (ref 11.5–15.5)
WBC: 8.3 10*3/uL (ref 4.0–10.5)
WBC: 8.6 10*3/uL (ref 4.0–10.5)

## 2010-09-03 LAB — BASIC METABOLIC PANEL
BUN: 6 mg/dL (ref 6–23)
CO2: 24 mEq/L (ref 19–32)
Calcium: 8.1 mg/dL — ABNORMAL LOW (ref 8.4–10.5)
Chloride: 105 mEq/L (ref 96–112)
Creatinine, Ser: 0.85 mg/dL (ref 0.4–1.5)
GFR calc Af Amer: >60 mL/min (ref >60)
GFR calc non Af Amer: >60 mL/min (ref >60)
Glucose, Bld: 280 mg/dL — ABNORMAL HIGH (ref 70–99)
Potassium: 4 mEq/L (ref 3.5–5.1)
Sodium: 138 mEq/L (ref 135–145)

## 2010-09-03 LAB — URINALYSIS, ROUTINE W REFLEX MICROSCOPIC
Bilirubin Urine: NEGATIVE
Glucose, UA: >1000 mg/dL — AB
Hgb urine dipstick: NEGATIVE
Ketones, ur: NEGATIVE mg/dL
Leukocytes, UA: NEGATIVE
Nitrite: NEGATIVE
Protein, ur: NEGATIVE mg/dL
Specific Gravity, Urine: 1.031 — ABNORMAL HIGH (ref 1.005–1.030)
Urobilinogen, UA: 1 mg/dL (ref 0.0–1.0)
pH: 7 (ref 5.0–8.0)

## 2010-09-03 LAB — RAPID URINE DRUG SCREEN, HOSP PERFORMED
Amphetamines: NOT DETECTED
Barbiturates: NOT DETECTED
Benzodiazepines: NOT DETECTED
Cocaine: NOT DETECTED
Opiates: NOT DETECTED
Tetrahydrocannabinol: NOT DETECTED

## 2010-09-03 LAB — GLUCOSE, CAPILLARY
Glucose-Capillary: 214 mg/dL — ABNORMAL HIGH (ref 70–99)
Glucose-Capillary: 245 mg/dL — ABNORMAL HIGH (ref 70–99)
Glucose-Capillary: 264 mg/dL — ABNORMAL HIGH (ref 70–99)
Glucose-Capillary: 285 mg/dL — ABNORMAL HIGH (ref 70–99)
Glucose-Capillary: 385 mg/dL — ABNORMAL HIGH (ref 70–99)
Glucose-Capillary: 487 mg/dL — ABNORMAL HIGH (ref 70–99)
Glucose-Capillary: >600 mg/dL (ref 70–99)

## 2010-09-03 LAB — URINE MICROSCOPIC-ADD ON

## 2010-09-03 LAB — OSMOLALITY: Osmolality: 286 mOsm/kg (ref 275–300)

## 2010-10-02 NOTE — Discharge Summary (Signed)
[name], [name] NO.:  [id number]   MEDICAL RECORD NO.:  [id number]          PATIENT TYPE:  INP   LOCATION:  5506                         FACILITY:  [facility]   PHYSICIAN:  Lucy Chris, MD     DATE OF BIRTH:  29-Sep-1970   DATE OF ADMISSION:  06/15/2008  DATE OF DISCHARGE:  06/16/2008                               DISCHARGE SUMMARY   DISCHARGE DIAGNOSES:  1. Hyperglycemia secondary to suspected medication noncompliance,      resolved.  2. Folliculitis of right nostril, resolving.  3. Diabetes mellitus, insulin dependent with hemoglobin A1c of 14.0.  4. History of hypertension.   DISCHARGE MEDICATIONS:  1. Novolin 70/30 inject 20 units subcutaneously in the morning and 20      units at night.  2. Bactrim DS, 1 tablet p.o. b.i.d. as previously prescribed for a      total of 10 days.   DISPOSITION AND FOLLOWUP:  The patient was discharged home in stable  condition with his hyperglycemia resolved.  He is to follow up with Dr.  Eyvonne Mechanic at the [facility] on June 24, 2008, at 1:30  p.m.  At this time, the patient needs a referral to the outpatient  diabetic education coordinator,  Barnabas Harries.  In addition, his  adherence to his insulin regimen should be assessed and further  medication adjustments made based on his weekly blood glucose levels.  Furthermore, an examination of his right nostril should be performed to  assure resolution of his folliculitis/acne.   PROCEDURE PERFORMED:  A chest x-ray performed on June 15, 2008,  showed no active cardiopulmonary disease.   CONSULTATIONS:  None.   BRIEF ADMITTING HISTORY AND PHYSICAL:  [name] is a 40 year old  gentleman with a past medical history significant for hypertension,  diabetes, medical noncompliance who presents complaining of uncontrolled  blood sugars since 5-6 days prior to admission.  The patient states that  he is taking his insulin as prescribed; however, he is unsure of  whether  his insulin is clear or cloudy or exactly when it expires.  The patient  was also complaining of a small lesion inside of his right nostril that  started approximately 6 days prior to admission.  He visited the [facility] Emergency Department 2 days prior to admission for this, and at  that time, he was placed on Bactrim for an anticipated 10-day course.  No incision and drainage was performed at that time as it was felt that  the lesion was too small.  The patient states that his visit to the  emergency room on day of admission was prompted primarily because of the  lesion in his nose and his higher than usual blood sugars.  He denies  any fevers, chills, cough, dysuria, abdominal pain, nausea, vomiting, or  diarrhea.   On admission, the patient had a temperature of 98.5, blood pressure of  130/79, pulse of 92, respiratory rate of 20, oxygen saturation of 99% on  room air.  In general, he is in no acute distress.  Eye exam revealed  extraocular motions  to be intact and pupils equal, round, and reactive  to light.  ENT showed moist mucous membranes and no erythema or exudate  in the oral cavity or oropharynx.  The patient was noticed to have a 3-  mm raised pustule on the inferior nares of the right side with minimal  erythema or tenderness to palpation.  Neck exam was supple with no  thyromegaly.  Respiratory exam revealed normal breath sounds bilaterally  and clear to auscultation bilaterally.  Cardiovascular exam revealed  regular rate and rhythm with no murmurs, rubs, or gallops.  GI exam  revealed positive bowel sounds, soft, nontender, nondistended abdomen.  Extremity exam showed no clubbing, cyanosis, or edema.  Lymph exam  showed no lymphadenopathy.  Neuro exam showed, the patient to be alert  and oriented x3.  Cranial nerves II-XII were intact and otherwise  nonfocal exam.  Psych exam was appropriate.   Labs on admission included a white blood cell count of 8.6,  hemoglobin  of 14.6, platelets of 252, ANC of 7.1, MCV of 85.  Sodium 128, potassium  3.6, chloride 191, bicarb 28, BUN 6, creatinine 0.93, glucose 540.  Total bilirubin 0.7, alk phos 116, AST 21, ALT 16, total protein 6.8,  albumin 3.2.  Calcium is 8.8.  Urinalysis showed greater than 1000  glucose and was otherwise unremarkable.  Serum osmolality was 286.  Urine drug screen was negative.   HOSPITAL COURSE BY PROBLEM:  1. Diabetes mellitus/hyperglycemia:  On admission, the patient was      noticed to have a blood glucose level of greater than 550.  Because      he was an insulin-dependent diabetic, it was important to rule out      a hyperosmolar hyperglycemic state, as well as diabetic      ketoacidosis.  After this was done, the patient was admitted to a      regular floor bed for further control of his blood glucose levels.      The patient had no signs of systemic infection including a fever or      leukocytosis, and his chest x-ray and urinalysis were unremarkable.      However, a urine culture and blood cultures were obtained and these      remained negative by day of discharge.  The patient also had a      normal EKG and unremarkable cardiac enzymes during his      hospitalization, and he was without symptoms of chest pain or      shortness of breath.  His hypoglycemia was initially controlled      with 10 units of NPH along with a sensitive sliding scale.  This      did well to decrease his blood sugars from 540 on admission to 214      by discharge.  Overall, it was thought that his hyperglycemia was      secondary to insulin deficiency from nonadherence and the patient      was counseled on the importance of utilizing his medications as      prescribed.  The patient was seen by a diabetic education      coordinator while he was hospitalized, and he would benefit from      further counseling as an outpatient for this.  By the time of his      discharge, the patient was placed  back on his home insulin regimen      of 70/30 and instructed to follow  up at the [facility] for further monitoring of his diabetes mellitus.  2. Nasal folliculitis:  Overall, the patient had a 2-day course of      Bactrim prior to this admission as he had visited the emergency      department for the lesion in his right nostril.  He denies any      history of MRSA infections or recurrent abscesses.  It was thought      because of the relatively small size of his lesion that this could      have precipitated his hyperglycemic event noted above.  Review of      the ED records show that no attempt at an incision and drainage was      made due to the small size of the lesion.  The patient was      continued on the Bactrim while he was hospitalized and states that      overall the size of his lesion has slowly but progressively      decreased.  He denied any significant tenderness, purulent      discharge, or bleeding from the site.  Upon discharge, the patient      was instructed to complete his previously prescribed course of      Bactrim.   DISCHARGE LABORATORY DATA AND VITAL SIGNS:  On day of discharge, the  patient had a temperature of 98.6, pulse of 85, respiratory rate of 20,  blood pressure of 127/75, oxygen saturation of 96% on room air.  White  blood cell count was 8.3, hemoglobin 12.2, MCV 83.7, platelets 266.  Sodium 138, potassium 4.0, chloride 105, bicarb 24, glucose 214, BUN 6,  creatinine 0.85, calcium 8.1.      Stephanie Coup, MD  Electronically Signed      Lucy Chris, MD  Electronically Signed    RK/MEDQ  D:  06/16/2008  T:  06/17/2008  Job:  [id number]   cc:   Yolonda Kida, MD

## 2010-10-03 ENCOUNTER — Encounter: Payer: Self-pay | Admitting: Internal Medicine

## 2010-10-05 ENCOUNTER — Encounter: Payer: Self-pay | Admitting: Internal Medicine

## 2010-10-17 ENCOUNTER — Encounter: Payer: Self-pay | Admitting: Internal Medicine

## 2010-10-17 ENCOUNTER — Ambulatory Visit (INDEPENDENT_AMBULATORY_CARE_PROVIDER_SITE_OTHER): Payer: Self-pay | Admitting: Internal Medicine

## 2010-10-17 ENCOUNTER — Ambulatory Visit: Payer: Self-pay | Admitting: Dietician

## 2010-10-17 VITALS — BP 126/84 | HR 85 | Temp 97.4°F | Ht 67.0 in | Wt 150.9 lb

## 2010-10-17 DIAGNOSIS — E119 Type 2 diabetes mellitus without complications: Secondary | ICD-10-CM

## 2010-10-17 DIAGNOSIS — E785 Hyperlipidemia, unspecified: Secondary | ICD-10-CM

## 2010-10-17 DIAGNOSIS — I10 Essential (primary) hypertension: Secondary | ICD-10-CM

## 2010-10-17 LAB — BASIC METABOLIC PANEL
BUN: 7 mg/dL (ref 6–23)
CO2: 29 mEq/L (ref 19–32)
Calcium: 9.3 mg/dL (ref 8.4–10.5)
Chloride: 96 mEq/L (ref 96–112)
Creat: 0.99 mg/dL (ref 0.40–1.50)
Glucose, Bld: 422 mg/dL — ABNORMAL HIGH (ref 70–99)
Potassium: 4.2 mEq/L (ref 3.5–5.3)
Sodium: 133 mEq/L — ABNORMAL LOW (ref 135–145)

## 2010-10-17 LAB — POCT GLYCOSYLATED HEMOGLOBIN (HGB A1C): Hemoglobin A1C: >14

## 2010-10-17 LAB — GLUCOSE, CAPILLARY: Glucose-Capillary: 539 mg/dL — ABNORMAL HIGH (ref 70–99)

## 2010-10-17 MED ORDER — PRAVASTATIN SODIUM 40 MG PO TABS: 40.0000 mg | ORAL_TABLET | Freq: Every day | ORAL | Status: DC

## 2010-10-17 MED ORDER — INSULIN NPH ISOPHANE & REGULAR (70-30) 100 UNIT/ML ~~LOC~~ SUSP: SUBCUTANEOUS | Status: DC

## 2010-10-17 NOTE — Assessment & Plan Note (Signed)
Given the patient's lipid profile I will increase his pravastatin 40 mg daily and recheck fasting lipids in one month

## 2010-10-17 NOTE — Assessment & Plan Note (Signed)
Patient's blood pressure is better controlled by a reduced salt intake, his blood pressure today is within goal, we'll check a microalbumin creatinine ratio is elevated patient may benefit from an ACE inhibitor despite well-controlled blood pressure

## 2010-10-17 NOTE — Assessment & Plan Note (Addendum)
Patient reports he's been out of his medications for the past several weeks his A1c is more than 14 with average sugars of more than 400, will refill his 70/30 at 30 units twice a day, provide the patient with a meter and insulin supplies. I've instructed the patient to record his CBGs 3 times a day and come back in one week to make further adjustments to his insulin.

## 2010-10-17 NOTE — Progress Notes (Signed)
  Subjective:    Patient ID: [name], male    DOB: 1970/09/08, 40 y.o.   MRN: [id number]  HPI  Patient is a 40 year old male with a past medical history of insulin dependent diabetes mellitus, hyperlipidemia and hypertension presents to the outpatient clinic for a routine followup and request for medication refills. Patient reports noncompliance with his medications. Does not record his sugars, patient's A1c today is more than 14. I have discussed this in length with the patient and I believe that now he understands the seriousness of his noncompliance. Patient reports that he is asymptomatic, denies any complaints.  Review of Systems  [all other systems reviewed and are negative       Objective:   Physical Exam  [nursing notereviewed. Constitutional: He is oriented to person, place, and time. He appears well-developed and well-nourished.  HENT:  Head: Normocephalic and atraumatic.  Eyes: Pupils are equal, round, and reactive to light.  Neck: Normal range of motion. No JVD present. No thyromegaly present.  Cardiovascular: Normal rate, regular rhythm and normal heart sounds.   Pulmonary/Chest: Effort normal and breath sounds normal. He has no wheezes. He has no rales.  Abdominal: Soft. Bowel sounds are normal. There is no tenderness. There is no rebound.  Musculoskeletal: Normal range of motion. He exhibits no edema.  Neurological: He is alert and oriented to person, place, and time.  Skin: Skin is warm and dry.          Assessment & Plan:

## 2010-10-18 LAB — MICROALBUMIN / CREATININE URINE RATIO
Creatinine, Urine: 36.5 mg/dL
Microalb Creat Ratio: 13.7 mg/g (ref 0.0–30.0)
Microalb, Ur: 0.5 mg/dL (ref 0.00–1.89)

## 2010-10-24 ENCOUNTER — Encounter: Payer: Self-pay | Admitting: Internal Medicine

## 2010-11-01 ENCOUNTER — Ambulatory Visit: Payer: Self-pay | Admitting: Internal Medicine

## 2010-12-17 ENCOUNTER — Ambulatory Visit (INDEPENDENT_AMBULATORY_CARE_PROVIDER_SITE_OTHER): Payer: Self-pay | Admitting: Internal Medicine

## 2010-12-17 ENCOUNTER — Encounter: Payer: Self-pay | Admitting: Internal Medicine

## 2010-12-17 VITALS — BP 120/81 | HR 76 | Temp 97.2°F | Ht 67.0 in | Wt 152.8 lb

## 2010-12-17 DIAGNOSIS — I10 Essential (primary) hypertension: Secondary | ICD-10-CM

## 2010-12-17 DIAGNOSIS — E785 Hyperlipidemia, unspecified: Secondary | ICD-10-CM

## 2010-12-17 DIAGNOSIS — E119 Type 2 diabetes mellitus without complications: Secondary | ICD-10-CM

## 2010-12-17 LAB — LIPID PANEL
Cholesterol: 162 mg/dL (ref 0–200)
HDL: 37 mg/dL — ABNORMAL LOW (ref >39)
LDL Cholesterol: 98 mg/dL (ref 0–99)
Total CHOL/HDL Ratio: 4.4 Ratio
Triglycerides: 135 mg/dL (ref <150)
VLDL: 27 mg/dL (ref 0–40)

## 2010-12-17 LAB — GLUCOSE, CAPILLARY: Glucose-Capillary: 353 mg/dL — ABNORMAL HIGH (ref 70–99)

## 2010-12-17 MED ORDER — PRAVASTATIN SODIUM 40 MG PO TABS: 40.0000 mg | ORAL_TABLET | Freq: Every day | ORAL | Status: DC

## 2010-12-17 MED ORDER — INSULIN NPH ISOPHANE & REGULAR (70-30) 100 UNIT/ML ~~LOC~~ SUSP: SUBCUTANEOUS | Status: DC

## 2010-12-17 NOTE — Assessment & Plan Note (Addendum)
Patient has not been adherent to the treatment for long time. Today his blood glucose is 353. His last A1c was >14.0. Patient's main issue is non-adherence to his insulin due to financial difficult. I referred him to our social worker for the possibility of getting the orange card. I spent some time with him to emphasize the importance of controlling his blood sugar. I refilled his Novolin today and instruct him to use it regularly. Hopefully he can get his orange card soon. He needs to have PneumoVas and ophthalmology referral as well as lipid test. I hold all these test and treatment for now due to his financial situation. I will order these test and treatment as soon as he gets his orange card.

## 2010-12-17 NOTE — Patient Instructions (Addendum)
1. It is very important to use insulin regularly. I understand your financial insulation. We will try to help you get the [facility] card, so you can buy and use insulin.  2. Please take Pravachol as prescribbed.

## 2010-12-18 ENCOUNTER — Encounter: Payer: Self-pay | Admitting: Internal Medicine

## 2010-12-18 NOTE — Assessment & Plan Note (Signed)
Patient is not taking any medication. His blood pressure is 120/81 mmHg. Will follow up.

## 2010-12-18 NOTE — Assessment & Plan Note (Signed)
Patient reports that he did purchase his Pravastatin and is taking it regularly. I will check his lipid as soon as he gets his orange card.

## 2010-12-18 NOTE — Progress Notes (Signed)
  Subjective:    Patient ID: [name], male    DOB: [birth year], 39 y.o.   MRN: [id number]  HPI Patient is a 40 year old male with a PMH of diabetes mellitus, hyperlipidemia and hypertension presents for a follow-up visit. Patient was last seen on Oct 17, 2010 when his A1c was 14. Patient was prescribed with Novolin (70-30), but he has never purchased this medication due to financial difficulty. He reports that he occasionally uses his other family member's insulin to keep his blood sugar from going too high. He denies any symptoms such as thirsty, polyuria and polydipsia. He does not have fever, chills, vision change, chest pain, palpitation, abdominal pain and skin infection.    Review of Systems ROS: General: no fevers, chills, changes in weight, changes in appetite Skin: no rash HEENT: no blurry vision, hearing changes, sore throat Pulm: no dyspnea, coughing, wheezing CV: no chest pain, palpitations, shortness of breath Abd: no abdominal pain, nausea/vomiting, diarrhea/constipation GU: no dysuria, hematuria, polyuria Ext: no arthralgias, myalgias Neuro: no weakness, numbness, or tingling      Objective:   Physical Exam  General: alert, well-developed, and cooperative to examination.  Head: normocephalic and atraumatic.  Eyes: vision grossly intact, pupils equal, pupils round, pupils reactive to light, no injection and anicteric.  Mouth: pharynx pink and moist, no erythema, and no exudates.  Neck: supple, full ROM, no thyromegaly, no JVD, and no carotid bruits.  Lungs: normal respiratory effort, no accessory muscle use, normal breath sounds, no crackles, and no wheezes. Heart: normal rate, regular rhythm, no murmur, no gallop, and no rub.  Abdomen: soft, non-tender, normal bowel sounds, no distention, no guarding, no rebound tenderness, no hepatomegaly, and no splenomegaly.  Msk: no joint swelling, no joint warmth, and no redness over joints.  Pulses: 2+ DP/PT pulses  bilaterally Extremities: No cyanosis, clubbing, edema Neurologic: strength normal in all extremities, sensation intact to light touch, and gait normal.  Skin: turgor normal and no rashes.        Assessment & Plan:

## 2010-12-19 NOTE — Progress Notes (Signed)
I agree with Assessment and plan as per Dr. Donna Bernard.

## 2011-02-22 LAB — GLUCOSE, CAPILLARY: Glucose-Capillary: 338 mg/dL — ABNORMAL HIGH (ref 70–99)

## 2011-05-02 ENCOUNTER — Encounter: Payer: Self-pay | Admitting: Internal Medicine

## 2011-05-02 ENCOUNTER — Ambulatory Visit (INDEPENDENT_AMBULATORY_CARE_PROVIDER_SITE_OTHER): Payer: Self-pay | Admitting: Internal Medicine

## 2011-05-02 VITALS — BP 118/73 | HR 82 | Temp 98.7°F | Ht 67.0 in | Wt 146.4 lb

## 2011-05-02 DIAGNOSIS — E119 Type 2 diabetes mellitus without complications: Secondary | ICD-10-CM

## 2011-05-02 DIAGNOSIS — I10 Essential (primary) hypertension: Secondary | ICD-10-CM

## 2011-05-02 DIAGNOSIS — E785 Hyperlipidemia, unspecified: Secondary | ICD-10-CM

## 2011-05-02 LAB — BASIC METABOLIC PANEL
BUN: 5 mg/dL — ABNORMAL LOW (ref 6–23)
CO2: 26 mEq/L (ref 19–32)
Calcium: 9.5 mg/dL (ref 8.4–10.5)
Chloride: 98 mEq/L (ref 96–112)
Creat: 0.77 mg/dL (ref 0.50–1.35)
Glucose, Bld: 397 mg/dL — ABNORMAL HIGH (ref 70–99)
Potassium: 4 mEq/L (ref 3.5–5.3)
Sodium: 136 mEq/L (ref 135–145)

## 2011-05-02 LAB — GLUCOSE, CAPILLARY: Glucose-Capillary: 479 mg/dL — ABNORMAL HIGH (ref 70–99)

## 2011-05-02 LAB — POCT GLYCOSYLATED HEMOGLOBIN (HGB A1C): Hemoglobin A1C: >14

## 2011-05-02 MED ORDER — INSULIN NPH ISOPHANE & REGULAR (70-30) 100 UNIT/ML ~~LOC~~ SUSP: SUBCUTANEOUS | Status: DC

## 2011-05-02 MED ORDER — INSULIN NPH ISOPHANE & REGULAR (70-30) 100 UNIT/ML ~~LOC~~ SUSP: 15.0000 [IU] | Freq: Two times a day (BID) | SUBCUTANEOUS | Status: DC

## 2011-05-02 NOTE — Patient Instructions (Signed)
1. Take the Insulin 15 units twice daily with meals  - the Over the counter insulin you can get it from [facility] without a prescription.  It should be around 24-25 dollars per vial.  2.  You NEED to get your orange card taken care of.  See the information below so you can be prepared when you meet with the financial counselor.  Items required to complete an Eligibility Application   1. Picture ID (Can't be expired) 2. Current Bill to establish proof of residency 3. W-2 & Tax return (if self-employed include "Schedule C"), if not filing Form 4506 4. 4 current Pay stubs for this year 5. Printout of other income (Social security, unemployment, child support, workmen's comp) 6. Food stamp award letter, if receiving  7. Life Insurance (Need copy of the front page, showing name Ins Co. Name, and face amount). 8. Statement for pension, 401-K, IRS (needs to have current balance) 9. Tax Value for cars, houses, mobile homes, and land (Get from [facility] Tax Department) 10. Disability Paperwork (showing status of case) 11. College students: Print out of [place] received, tuition cost, books, etc. 12. If no Income: Games developer of support for free shelter, money, food, Social research officer, government.  Bring all that you can to your follow up appointment to start the process.   3.  Follow up with [place] in about 3-4 weeks.

## 2011-05-02 NOTE — Progress Notes (Signed)
Subjective:   Patient ID: [name] male   DOB: 08/04/1970 40 y.o.   MRN: [id number]  HPI: [name] is a 40 y.o. man who presents to clinic today complaining of increased blood sugars.  He states that he has been out of insulin for over a month.  He has a history of insulin dependent diabetes for many years.  He states that he has been unable to fill his insulin because he is unable to afford it consistently.  His family will help by buying a vial here and there but he has no insurance and has not completed his application for this orange card.  He states he does urinate often and is thirsty a lot but denies abdominal pain, nausea, vomiting, or blurry vision.   He was supposed to start pravastatin after his last visit but has not filled it.  He is currently taking no medication.    Past Medical History  Diagnosis Date  . Diabetes mellitus type II, uncontrolled 07/17/2006  . ESSENTIAL HYPERTENSION 07/17/2006  . HYPERLIPIDEMIA 11/24/2008   Current Outpatient Prescriptions  Medication Sig Dispense Refill  . insulin NPH-insulin regular (NOVOLIN 70/30) (70-30) 100 UNIT/ML injection Inject 30 Units into the skin 2 (two) times daily with a meal.        . pravastatin (PRAVACHOL) 40 MG tablet Take 40 mg by mouth daily.         Family History  Problem Relation Age of Onset  . Hypertension Mother 2  . Cancer Mother     bladder cancer  . Alcohol abuse Father   . Hypertension Brother    History   Social History  . Marital Status: Married    Spouse Name: N/A    Number of Children: N/A  . 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 Smoking status: Never Smoker   . Smokeless tobacco: None  . Alcohol Use: No  . Drug Use: No  . Sexually Active: None   Other Topics Concern  . 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    Review of Systems: Negative except as noted in the HPI.    Objective:  Physical Exam: Filed Vitals:   05/02/11 1508  BP: 118/73  Pulse: 82  Temp: 98.7 F (37.1 C)  TempSrc: Oral  Height: 5\' 7"  (1.702 m)  Weight: 146 lb 6.4 oz (66.407 kg)   Constitutional: Vital signs reviewed.  Patient is a thin, well-developed, and well-nourished man in no acute distress and cooperative with exam. Alert and oriented x3.  Head: Normocephalic and atraumatic Ear: TM normal bilaterally Mouth: no erythema or exudates, MMM Eyes: PERRL, EOMI, conjunctivae normal, No scleral icterus.  Neck: Supple, Trachea midline normal ROM, No JVD, mass, thyromegaly, or carotid bruit present.  Cardiovascular: RRR, S1 normal, S2 normal, no MRG, pulses symmetric and intact bilaterally Pulmonary/Chest: CTAB, no wheezes, rales, or rhonchi Abdominal: Soft. Non-tender, non-distended, bowel sounds are normal, no masses, organomegaly, or guarding present.  GU: no CVA tenderness Musculoskeletal: No joint deformities, erythema, or stiffness, ROM full and no nontender Hematology: no cervical, inginal, or axillary adenopathy.  Neurological: A&O x3, Strenght is normal and symmetric bilaterally, cranial nerve II-XII are grossly intact, no focal motor deficit, sensory intact to light touch bilaterally.  Skin: on the dorsal  forearms there are scattered macules with excoriation marks with a silvery sheen.  Warm, dry and intact. No cyanosis, or clubbing.  Psychiatric: Normal mood and affect. speech and behavior is normal. Judgment and thought content normal. Cognition and memory are normal.   Assessment & Plan:

## 2011-05-03 LAB — HEMOGLOBIN A1C
Hgb A1c MFr Bld: 13.3 % — ABNORMAL HIGH (ref <5.7)
Mean Plasma Glucose: 335 mg/dL — ABNORMAL HIGH (ref <117)

## 2011-05-15 ENCOUNTER — Emergency Department (HOSPITAL_COMMUNITY)
Admission: EM | Admit: 2011-05-15 | Discharge: 2011-05-16 | Disposition: A | Discharge status: Home / Self Care | Payer: Self-pay | Attending: Emergency Medicine | Admitting: Emergency Medicine

## 2011-05-15 ENCOUNTER — Encounter (HOSPITAL_COMMUNITY): Payer: Self-pay | Admitting: Emergency Medicine

## 2011-05-15 DIAGNOSIS — I10 Essential (primary) hypertension: Secondary | ICD-10-CM | POA: Insufficient documentation

## 2011-05-15 DIAGNOSIS — R739 Hyperglycemia, unspecified: Secondary | ICD-10-CM

## 2011-05-15 DIAGNOSIS — E1169 Type 2 diabetes mellitus with other specified complication: Secondary | ICD-10-CM | POA: Insufficient documentation

## 2011-05-15 DIAGNOSIS — Z9119 Patient's noncompliance with other medical treatment and regimen: Secondary | ICD-10-CM | POA: Insufficient documentation

## 2011-05-15 DIAGNOSIS — Z91199 Patient's noncompliance with other medical treatment and regimen due to unspecified reason: Secondary | ICD-10-CM | POA: Insufficient documentation

## 2011-05-15 DIAGNOSIS — Z9114 Patient's other noncompliance with medication regimen: Secondary | ICD-10-CM

## 2011-05-15 DIAGNOSIS — E7800 Pure hypercholesterolemia, unspecified: Secondary | ICD-10-CM | POA: Insufficient documentation

## 2011-05-15 DIAGNOSIS — Z794 Long term (current) use of insulin: Secondary | ICD-10-CM | POA: Insufficient documentation

## 2011-05-15 LAB — CBC
HCT: 42.9 % (ref 39.0–52.0)
Hemoglobin: 14.7 g/dL (ref 13.0–17.0)
MCH: 28.1 pg (ref 26.0–34.0)
MCHC: 34.3 g/dL (ref 30.0–36.0)
MCV: 81.9 fL (ref 78.0–100.0)
Platelets: 277 10*3/uL (ref 150–400)
RBC: 5.24 MIL/uL (ref 4.22–5.81)
RDW: 12.1 % (ref 11.5–15.5)
WBC: 7.3 10*3/uL (ref 4.0–10.5)

## 2011-05-15 LAB — BASIC METABOLIC PANEL
BUN: 10 mg/dL (ref 6–23)
CO2: 26 mEq/L (ref 19–32)
Calcium: 9.4 mg/dL (ref 8.4–10.5)
Chloride: 97 mEq/L (ref 96–112)
Creatinine, Ser: 0.67 mg/dL (ref 0.50–1.35)
GFR calc Af Amer: >90 mL/min (ref >90)
GFR calc non Af Amer: >90 mL/min (ref >90)
Glucose, Bld: 563 mg/dL (ref 70–99)
Potassium: 4 mEq/L (ref 3.5–5.1)
Sodium: 135 mEq/L (ref 135–145)

## 2011-05-15 LAB — URINALYSIS, ROUTINE W REFLEX MICROSCOPIC
Bilirubin Urine: NEGATIVE
Glucose, UA: >1000 mg/dL — AB
Hgb urine dipstick: NEGATIVE
Ketones, ur: NEGATIVE mg/dL
Leukocytes, UA: NEGATIVE
Nitrite: NEGATIVE
Protein, ur: NEGATIVE mg/dL
Specific Gravity, Urine: 1.039 — ABNORMAL HIGH (ref 1.005–1.030)
Urobilinogen, UA: 1 mg/dL (ref 0.0–1.0)
pH: 5 (ref 5.0–8.0)

## 2011-05-15 LAB — DIFFERENTIAL
Basophils Absolute: 0 10*3/uL (ref 0.0–0.1)
Basophils Relative: 0 % (ref 0–1)
Eosinophils Absolute: 0.1 10*3/uL (ref 0.0–0.7)
Eosinophils Relative: 1 % (ref 0–5)
Lymphocytes Relative: 29 % (ref 12–46)
Lymphs Abs: 2.1 10*3/uL (ref 0.7–4.0)
Monocytes Absolute: 0.5 10*3/uL (ref 0.1–1.0)
Monocytes Relative: 6 % (ref 3–12)
Neutro Abs: 4.6 10*3/uL (ref 1.7–7.7)
Neutrophils Relative %: 64 % (ref 43–77)

## 2011-05-15 LAB — URINE MICROSCOPIC-ADD ON

## 2011-05-15 LAB — GLUCOSE, CAPILLARY
Glucose-Capillary: 406 mg/dL — ABNORMAL HIGH (ref 70–99)
Glucose-Capillary: 557 mg/dL (ref 70–99)

## 2011-05-15 MED ORDER — INSULIN REGULAR HUMAN 100 UNIT/ML IJ SOLN
18.0000 [IU] | Freq: Once | INTRAMUSCULAR | Status: DC
Start: 2011-05-15 — End: 2011-05-15

## 2011-05-15 MED ORDER — SODIUM CHLORIDE 0.9 % IV BOLUS (SEPSIS)
1000.0000 mL | Freq: Once | INTRAVENOUS | Status: AC
  Administered 2011-05-15: 1000 mL via INTRAVENOUS

## 2011-05-15 MED ORDER — INSULIN ASPART 100 UNIT/ML ~~LOC~~ SOLN: 18.0000 [IU] | Freq: Once | SUBCUTANEOUS | Status: DC

## 2011-05-15 MED ORDER — INSULIN ASPART 100 UNIT/ML ~~LOC~~ SOLN
14.0000 [IU] | Freq: Once | SUBCUTANEOUS | Status: AC
  Administered 2011-05-15: 14 [IU] via SUBCUTANEOUS
  Filled 2011-05-15: qty 1

## 2011-05-15 NOTE — ED Notes (Signed)
Pt here for high blood sugar. Pt states been out of insulin syringe needles for approx 1 month. Pt states doesn't have the money to buy them. Pt ambulatory no distress noted.

## 2011-05-15 NOTE — ED Notes (Signed)
PT. REPORTS ELATED BLOOD SUGAR YESTERDAY 500+ , REPORTS FEELING THIRSTY .

## 2011-05-15 NOTE — ED Provider Notes (Signed)
History     CSN: [id number]  Arrival date & time 05/15/11  2046   First MD Initiated Contact with Patient 05/15/11 2332      No chief complaint on file.   (Consider location/radiation/quality/duration/timing/severity/associated sxs/prior treatment) Patient is a 40 y.o. male presenting with diabetes problem. The history is provided by the patient.  Diabetes He has type 2 diabetes mellitus. Pertinent negatives for diabetes include no fatigue.  States he has ran out of his insulin a month ago. Has not followed up with his doctor. Unable to fill insulin prescription due to finances. Denies any complaints otherwise. States is thirsty. Pt has not taken anything at home prior to arrival. States "i think my blood sugar is high because i am stressed."  Past Medical History  Diagnosis Date  . Diabetes mellitus   . Hypercholesteremia   . Hypertension     Past Surgical History  Procedure Date  . Arm surgery     Family History  Problem Relation Age of Onset  . Hypertension Mother 39  . Cancer Mother     bladder cancer  . Alcohol abuse Father   . Hypertension Brother     History  Substance Use Topics  . Smoking status: Never Smoker   . Smokeless tobacco: Not on file  . Alcohol Use: No      Review of Systems  Constitutional: Negative for fever, appetite change and fatigue.  HENT: Negative.   Eyes: Negative.   Respiratory: Negative.   Cardiovascular: Negative.   Gastrointestinal: Negative for nausea, vomiting, abdominal pain and diarrhea.  Genitourinary: Negative.   Musculoskeletal: Negative.   Skin: Negative.   Neurological: Negative.   Psychiatric/Behavioral: Negative.     Allergies  Review of patient's allergies indicates no known allergies.  Home Medications   Current Outpatient Rx  Name Route Sig Dispense Refill  . INSULIN ISOPHANE & REGULAR (70-30) 100 UNIT/ML [place] SUSP Subcutaneous Inject 30 Units into the skin 2 (two) times daily with a meal.      .  PRAVASTATIN SODIUM 40 MG PO TABS Oral Take 40 mg by mouth daily.        BP 119/68  Pulse 100  Temp(Src) 98.5 F (36.9 C) (Oral)  Resp 16  SpO2 98%  Physical Exam  Nursing note and vitals reviewed. Constitutional: He is oriented to person, place, and time. He appears well-developed and well-nourished.  HENT:  Head: Normocephalic.  Neck: Neck supple.  Cardiovascular: Normal rate, regular rhythm and normal heart sounds.   Pulmonary/Chest: Effort normal and breath sounds normal. No respiratory distress.  Abdominal: Soft. Bowel sounds are normal. There is no tenderness.  Musculoskeletal: Normal range of motion. He exhibits no edema.  Neurological: He is alert and oriented to person, place, and time.  Skin: Skin is warm and dry. No rash noted.  Psychiatric: He has a normal mood and affect.    ED Course  Procedures (including critical care time)  Results for orders placed during the hospital encounter of 05/15/11  CBC      Component Value Range   WBC 7.3  4.0 - 10.5 (K/uL)   RBC 5.24  4.22 - 5.81 (MIL/uL)   Hemoglobin 14.7  13.0 - 17.0 (g/dL)   HCT 42.9  39.0 - 52.0 (%)   MCV 81.9  78.0 - 100.0 (fL)   MCH 28.1  26.0 - 34.0 (pg)   MCHC 34.3  30.0 - 36.0 (g/dL)   RDW 12.1  11.5 - 15.5 (%)   Platelets  277  150 - 400 (K/uL)  DIFFERENTIAL      Component Value Range   Neutrophils Relative 64  43 - 77 (%)   Neutro Abs 4.6  1.7 - 7.7 (K/uL)   Lymphocytes Relative 29  12 - 46 (%)   Lymphs Abs 2.1  0.7 - 4.0 (K/uL)   Monocytes Relative 6  3 - 12 (%)   Monocytes Absolute 0.5  0.1 - 1.0 (K/uL)   Eosinophils Relative 1  0 - 5 (%)   Eosinophils Absolute 0.1  0.0 - 0.7 (K/uL)   Basophils Relative 0  0 - 1 (%)   Basophils Absolute 0.0  0.0 - 0.1 (K/uL)  BASIC METABOLIC PANEL      Component Value Range   Sodium 135  135 - 145 (mEq/L)   Potassium 4.0  3.5 - 5.1 (mEq/L)   Chloride 97  96 - 112 (mEq/L)   CO2 26  19 - 32 (mEq/L)   Glucose, Bld 563 (*) 70 - 99 (mg/dL)   BUN 10  6 - 23  (mg/dL)   Creatinine, Ser 0.67  0.50 - 1.35 (mg/dL)   Calcium 9.4  8.4 - 10.5 (mg/dL)   GFR calc non Af Amer >90  >90 (mL/min)   GFR calc Af Amer >90  >90 (mL/min)  URINALYSIS, ROUTINE W REFLEX MICROSCOPIC      Component Value Range   Color, Urine YELLOW  YELLOW    APPearance CLEAR  CLEAR    Specific Gravity, Urine 1.039 (*) 1.005 - 1.030    pH 5.0  5.0 - 8.0    Glucose, UA >1000 (*) NEGATIVE (mg/dL)   Hgb urine dipstick NEGATIVE  NEGATIVE    Bilirubin Urine NEGATIVE  NEGATIVE    Ketones, ur NEGATIVE  NEGATIVE (mg/dL)   Protein, ur NEGATIVE  NEGATIVE (mg/dL)   Urobilinogen, UA 1.0  0.0 - 1.0 (mg/dL)   Nitrite NEGATIVE  NEGATIVE    Leukocytes, UA NEGATIVE  NEGATIVE   GLUCOSE, CAPILLARY      Component Value Range   Glucose-Capillary 557 (*) 70 - 99 (mg/dL)   Comment 1 Notify RN     Comment 2 Documented in Chart    URINE MICROSCOPIC-ADD ON      Component Value Range   Squamous Epithelial / LPF RARE  RARE    Bacteria, UA RARE  RARE   GLUCOSE, CAPILLARY      Component Value Range   Glucose-Capillary 406 (*) 70 - 99 (mg/dL)  GLUCOSE, CAPILLARY      Component Value Range   Glucose-Capillary 316 (*) 70 - 99 (mg/dL)   No results found.  Pt does not appear to be in DKA. No anion gap. VS normal. Will rehydrate. Insulin administered SQ. Will recheck, CBG.  Spoke with Outpatient clinics. Will get pt appointment for tomorrow. Will contact him   2:00 AM CBG 211. VS normal.  Will d/c home.  [place], PA 05/16/11 0201

## 2011-05-16 LAB — GLUCOSE, CAPILLARY
Glucose-Capillary: 211 mg/dL — ABNORMAL HIGH (ref 70–99)
Glucose-Capillary: 316 mg/dL — ABNORMAL HIGH (ref 70–99)

## 2011-05-16 NOTE — ED Notes (Signed)
No distress noted.

## 2011-05-16 NOTE — ED Notes (Signed)
No distress noted. Pt resting quietly in bed.

## 2011-05-16 NOTE — ED Provider Notes (Signed)
Medical screening examination/treatment/procedure(s) were performed by non-physician practitioner and as supervising physician I was immediately available for consultation/collaboration.   Wynetta Fines, MD 05/16/11 [phone]

## 2011-05-17 ENCOUNTER — Telehealth: Payer: Self-pay | Admitting: *Deleted

## 2011-05-17 NOTE — Telephone Encounter (Signed)
[name]. Will see patient today. If needed we can make changes to meds today after her evaluation.

## 2011-05-17 NOTE — Telephone Encounter (Signed)
Message copied by Acquanetta Chain on Fri May 17, 2011 11:07 AM ------      Message from: Maxie Barb      Created: Fri May 17, 2011 10:39 AM      Regarding: FW: ER follow up                   ----- Message -----         From: Meriam Sprague         Sent: 05/17/2011  10:11 AM           To: Renaldo Harrison, RN      Subject: FW: ER follow up                                                     ----- Message -----         From: Nicoletta Dress, CMA         Sent: 05/16/2011  11:40 AM           To: [facility] Front Desk Pool      Subject: FW: ER follow up                                                     ----- Message -----         From: [name]         Sent: 05/16/2011  12:53 AM           To: [facility] Clinical Pool      Subject: ER follow up                                             Patient needs to be seen in the clinic today as ER follow up. He presented to the ER with blood sugars>500 in the setting of medication non compliance.

## 2011-05-17 NOTE — Telephone Encounter (Signed)
Message to schedule pt a follow up appointment for elevated glucose level in the ER.  Spoke with D. [place] Diabetes Educator.  Pt to come in today to meet with her.  Call to pt message left on voice mail to call the Clinics concerning an appointment today to discuss her glucose elevations.  Sander Nephew, RN 05/17/2011 11:06 AM

## 2011-05-25 ENCOUNTER — Encounter: Payer: Self-pay | Admitting: Internal Medicine

## 2011-05-25 NOTE — Assessment & Plan Note (Signed)
Lab Results  Component Value Date   NA 135 05/15/2011   K 4.0 05/15/2011   CL 97 05/15/2011   CO2 26 05/15/2011   BUN 10 05/15/2011   CREATININE 0.67 05/15/2011   CREATININE 0.77 05/02/2011    BP Readings from Last 3 Encounters:  05/16/11 100/70  05/02/11 118/73  12/17/10 120/81    Assessment: Hypertension control:  controlled  Progress toward goals:  at goal Barriers to meeting goals:  lack of insurance, financial need, nonadherence to medications and lack of understanding of disease management  Plan: Hypertension treatment:  We will continue to monitor since he is currently not on any medication.  His goal as a diabetic would be to keep his BP under <130/80.

## 2011-05-25 NOTE — Assessment & Plan Note (Signed)
Lab Results  Component Value Date   CHOL 162 12/17/2010   CHOL 207* 04/21/2008   Lab Results  Component Value Date   HDL 37* 12/17/2010   HDL 33* 04/21/2008   Lab Results  Component Value Date   LDLCALC 98 12/17/2010   LDLCALC 117* 04/21/2008   Lab Results  Component Value Date   TRIG 135 12/17/2010   TRIG 284* 04/21/2008   Lab Results  Component Value Date   CHOLHDL 4.4 12/17/2010   CHOLHDL 6.3 Ratio 04/21/2008   No results found for this basename: LDLDIRECT   His LDL was 98 at his last visit with his goal of an LDL <100.  We will continue to monitor and continue to encourage him to take his pravastatin.  He likely has not been eating well given his social situation.

## 2011-05-25 NOTE — Assessment & Plan Note (Signed)
Lab Results  Component Value Date   HGBA1C 13.3* 05/02/2011   HGBA1C >14.0 05/02/2011   HGBA1C >14.0 10/17/2010   Lab Results  Component Value Date   MICROALBUR 0.50 10/17/2010   LDLCALC 98 12/17/2010   CREATININE 0.67 05/15/2011   His A1C has been markedly poorly controlled for sometime secondary to non-compliance and inability to afford his medication secondary to his unemployed status and the fact he is essentially homeless, living on various family member's couches and floors.  Greater then 30 minutes were spent in counseling the patient on the importance of getting his orange card to help him pay for medication to care for his diabetes.  He was given 1 vial of 70/30 sample insulin and instructed to take 15 units BID to help him from going into DKA, knowing that this was likely very low on his insulin needs.  He was instructed to continue this and gather the paperwork to get his orange card.  We will also check a Bmet to ensure he is not currently in DKA given his CBG today was over 500.  Basic Metabolic Panel: Office Visit on 05/02/2011  Component Date Value Range Status  . Glucose-Capillary (mg/dL) 05/02/2011 479* 70-99 Final  . Comment 1  05/02/2011 Documented in Chart   Final  . Sodium (mEq/L) 05/02/2011 136  135-145 Final  . Potassium (mEq/L) 05/02/2011 4.0  3.5-5.3 Final  . Chloride (mEq/L) 05/02/2011 98  96-112 Final  . CO2 (mEq/L) 05/02/2011 26  19-32 Final  . Glucose, Bld (mg/dL) 05/02/2011 397* 70-99 Final  . BUN (mg/dL) 05/02/2011 5* 6-23 Final  . Creat (mg/dL) 05/02/2011 0.77  0.50-1.35 Final  . Calcium (mg/dL) 05/02/2011 9.5  8.4-10.5 Final   He is AG is 12 so he is currently not in DKA.  We will have him follow up in two weeks to ensure he has gotten his orange card and work to increase his insulin to help him better control his diabetes.

## 2011-06-11 ENCOUNTER — Encounter: Payer: Self-pay | Admitting: Internal Medicine

## 2011-07-10 ENCOUNTER — Encounter: Payer: Self-pay | Admitting: Internal Medicine

## 2011-10-21 ENCOUNTER — Inpatient Hospital Stay (HOSPITAL_COMMUNITY)
Admission: EM | Admit: 2011-10-21 | Discharge: 2011-10-25 | DRG: 193 | Disposition: A | Discharge status: Home / Self Care | Payer: Self-pay | Attending: Infectious Disease | Admitting: Infectious Disease

## 2011-10-21 ENCOUNTER — Encounter (HOSPITAL_COMMUNITY): Payer: Self-pay | Admitting: Emergency Medicine

## 2011-10-21 ENCOUNTER — Emergency Department (HOSPITAL_COMMUNITY): Payer: Self-pay

## 2011-10-21 DIAGNOSIS — E871 Hypo-osmolality and hyponatremia: Secondary | ICD-10-CM | POA: Diagnosis present

## 2011-10-21 DIAGNOSIS — N12 Tubulo-interstitial nephritis, not specified as acute or chronic: Secondary | ICD-10-CM | POA: Diagnosis present

## 2011-10-21 DIAGNOSIS — J189 Pneumonia, unspecified organism: Principal | ICD-10-CM | POA: Diagnosis present

## 2011-10-21 DIAGNOSIS — I10 Essential (primary) hypertension: Secondary | ICD-10-CM | POA: Diagnosis present

## 2011-10-21 DIAGNOSIS — E1142 Type 2 diabetes mellitus with diabetic polyneuropathy: Secondary | ICD-10-CM | POA: Diagnosis present

## 2011-10-21 DIAGNOSIS — E1010 Type 1 diabetes mellitus with ketoacidosis without coma: Secondary | ICD-10-CM | POA: Diagnosis present

## 2011-10-21 DIAGNOSIS — E785 Hyperlipidemia, unspecified: Secondary | ICD-10-CM | POA: Diagnosis present

## 2011-10-21 DIAGNOSIS — Z794 Long term (current) use of insulin: Secondary | ICD-10-CM

## 2011-10-21 DIAGNOSIS — Z9889 Other specified postprocedural states: Secondary | ICD-10-CM

## 2011-10-21 DIAGNOSIS — Z8249 Family history of ischemic heart disease and other diseases of the circulatory system: Secondary | ICD-10-CM

## 2011-10-21 HISTORY — DX: Pneumonia, unspecified organism: J18.9

## 2011-10-21 LAB — COMPREHENSIVE METABOLIC PANEL
ALT: 12 U/L (ref 0–53)
AST: 18 U/L (ref 0–37)
Albumin: 3.3 g/dL — ABNORMAL LOW (ref 3.5–5.2)
Alkaline Phosphatase: 107 U/L (ref 39–117)
BUN: 13 mg/dL (ref 6–23)
CO2: 21 mEq/L (ref 19–32)
Calcium: 9.1 mg/dL (ref 8.4–10.5)
Chloride: 87 mEq/L — ABNORMAL LOW (ref 96–112)
Creatinine, Ser: 0.75 mg/dL (ref 0.50–1.35)
GFR calc Af Amer: >90 mL/min (ref >90)
GFR calc non Af Amer: >90 mL/min (ref >90)
Glucose, Bld: 310 mg/dL — ABNORMAL HIGH (ref 70–99)
Potassium: 4.7 mEq/L (ref 3.5–5.1)
Sodium: 125 mEq/L — ABNORMAL LOW (ref 135–145)
Total Bilirubin: 0.3 mg/dL (ref 0.3–1.2)
Total Protein: 8.3 g/dL (ref 6.0–8.3)

## 2011-10-21 LAB — DIFFERENTIAL
Basophils Absolute: 0 10*3/uL (ref 0.0–0.1)
Basophils Relative: 0 % (ref 0–1)
Eosinophils Absolute: 0 10*3/uL (ref 0.0–0.7)
Eosinophils Relative: 0 % (ref 0–5)
Lymphocytes Relative: 6 % — ABNORMAL LOW (ref 12–46)
Lymphs Abs: 0.6 10*3/uL — ABNORMAL LOW (ref 0.7–4.0)
Monocytes Absolute: 1.5 10*3/uL — ABNORMAL HIGH (ref 0.1–1.0)
Monocytes Relative: 13 % — ABNORMAL HIGH (ref 3–12)
Neutro Abs: 9.3 10*3/uL — ABNORMAL HIGH (ref 1.7–7.7)
Neutrophils Relative %: 81 % — ABNORMAL HIGH (ref 43–77)

## 2011-10-21 LAB — BLOOD GAS, VENOUS
Acid-base deficit: 0.9 mmol/L (ref 0.0–2.0)
Bicarbonate: 23.9 mEq/L (ref 20.0–24.0)
FIO2: 0.21 %
O2 Saturation: 47.1 %
Patient temperature: 98.6
TCO2: 21.2 mmol/L (ref 0–100)
pCO2, Ven: 42.3 mmHg — ABNORMAL LOW (ref 45.0–50.0)
pH, Ven: 7.371 — ABNORMAL HIGH (ref 7.250–7.300)
pO2, Ven: 26.5 mmHg — CL (ref 30.0–45.0)

## 2011-10-21 LAB — URINALYSIS, ROUTINE W REFLEX MICROSCOPIC
Bilirubin Urine: NEGATIVE
Glucose, UA: >1000 mg/dL — AB
Hgb urine dipstick: NEGATIVE
Ketones, ur: >80 mg/dL — AB
Leukocytes, UA: NEGATIVE
Nitrite: NEGATIVE
Protein, ur: 100 mg/dL — AB
Specific Gravity, Urine: 1.043 — ABNORMAL HIGH (ref 1.005–1.030)
Urobilinogen, UA: 1 mg/dL (ref 0.0–1.0)
pH: 5.5 (ref 5.0–8.0)

## 2011-10-21 LAB — URINE MICROSCOPIC-ADD ON

## 2011-10-21 LAB — CBC
HCT: 44.4 % (ref 39.0–52.0)
Hemoglobin: 15.2 g/dL (ref 13.0–17.0)
MCH: 27.5 pg (ref 26.0–34.0)
MCHC: 34.2 g/dL (ref 30.0–36.0)
MCV: 80.4 fL (ref 78.0–100.0)
Platelets: 225 10*3/uL (ref 150–400)
RBC: 5.52 MIL/uL (ref 4.22–5.81)
RDW: 11.9 % (ref 11.5–15.5)
WBC: 11.5 10*3/uL — ABNORMAL HIGH (ref 4.0–10.5)

## 2011-10-21 LAB — GLUCOSE, CAPILLARY: Glucose-Capillary: 301 mg/dL — ABNORMAL HIGH (ref 70–99)

## 2011-10-21 LAB — LIPASE, BLOOD: Lipase: 11 U/L (ref 11–59)

## 2011-10-21 MED ORDER — SODIUM CHLORIDE 0.9 % IV SOLN
1000.0000 mL | INTRAVENOUS | Status: DC
  Administered 2011-10-21: 1000 mL via INTRAVENOUS
  Administered 2011-10-22: 500 mL via INTRAVENOUS
  Administered 2011-10-22 (×2): 1000 mL via INTRAVENOUS

## 2011-10-21 MED ORDER — DEXTROSE 5 % IV SOLN
1.0000 g | INTRAVENOUS | Status: DC
  Administered 2011-10-21: 1 g via INTRAVENOUS
  Filled 2011-10-21 (×2): qty 10

## 2011-10-21 MED ORDER — IOHEXOL 300 MG/ML  SOLN
80.0000 mL | Freq: Once | INTRAMUSCULAR | Status: AC | PRN
  Administered 2011-10-21: 80 mL via INTRAVENOUS

## 2011-10-21 MED ORDER — HYDROMORPHONE HCL PF 1 MG/ML IJ SOLN
1.0000 mg | Freq: Once | INTRAMUSCULAR | Status: AC
  Administered 2011-10-21: 1 mg via INTRAVENOUS
  Filled 2011-10-21: qty 1

## 2011-10-21 MED ORDER — SODIUM CHLORIDE 0.9 % IV SOLN
1000.0000 mL | Freq: Once | INTRAVENOUS | Status: DC
  Administered 2011-10-21: 1000 mL via INTRAVENOUS

## 2011-10-21 MED ORDER — ONDANSETRON HCL 4 MG/2ML IJ SOLN
4.0000 mg | Freq: Once | INTRAMUSCULAR | Status: AC
  Administered 2011-10-21: 4 mg via INTRAVENOUS
  Filled 2011-10-21: qty 2

## 2011-10-21 MED ORDER — ACETAMINOPHEN 500 MG PO TABS
1000.0000 mg | ORAL_TABLET | Freq: Once | ORAL | Status: AC
  Administered 2011-10-21: 1000 mg via ORAL
  Filled 2011-10-21: qty 2

## 2011-10-21 NOTE — ED Notes (Addendum)
Pt presenting to ed with c/o abdominal pain x 2 days pt states no nausea, vomiting or diarrhea. Pt states urinary frequency and he has not been taking his insulin. Pt also states frequent thirst.

## 2011-10-21 NOTE — ED Provider Notes (Signed)
History     CSN: [id number]  Arrival date & time 10/21/11  1721   First MD Initiated Contact with Patient 10/21/11 1844      Chief Complaint  Patient presents with  . Abdominal Pain  . Fever     Patient is a 41 y.o. male presenting with abdominal pain and fever. The history is provided by the patient.  Abdominal Pain The primary symptoms of the illness include abdominal pain and fever. The primary symptoms of the illness do not include nausea, vomiting or dysuria. The current episode started more than 2 days ago. The onset of the illness was gradual. The problem has been gradually worsening.  The abdominal pain radiates to the RLQ. The severity of the abdominal pain is 10/10. The abdominal pain is relieved by nothing.  Additional symptoms associated with the illness include anorexia. Symptoms associated with the illness do not include constipation. Significant associated medical issues include diabetes.  Fever Primary symptoms of the febrile illness include fever and abdominal pain. Primary symptoms do not include nausea, vomiting or dysuria.    Past Medical History  Diagnosis Date  . Diabetes mellitus type II, uncontrolled 07/17/2006  . ESSENTIAL HYPERTENSION 07/17/2006  . HYPERLIPIDEMIA 11/24/2008    Past Surgical History  Procedure Date  . Arm surgery   no GB or appy  Family History  Problem Relation Age of Onset  . Hypertension Mother 1  . Cancer Mother     bladder cancer  . Alcohol abuse Father   . Hypertension Brother     History  Substance Use Topics  . Smoking status: Never Smoker   . Smokeless tobacco: Not on file  . Alcohol Use: No      Review of Systems  Constitutional: Positive for fever.  Gastrointestinal: Positive for abdominal pain and anorexia. Negative for nausea, vomiting and constipation.  Genitourinary: Negative for dysuria.  All other systems reviewed and are negative.    Allergies  Review of patient's allergies indicates no known  allergies.  Home Medications   Current Outpatient Rx  Name Route Sig Dispense Refill  . INSULIN ISOPHANE & REGULAR (70-30) 100 UNIT/ML Gray SUSP Subcutaneous Inject 30 Units into the skin 2 (two) times daily with a meal.        BP 129/78  Pulse 111  Temp(Src) 103.2 F (39.6 C) (Oral)  Resp 20  SpO2 97%  Physical Exam  Nursing note and vitals reviewed. Constitutional: He appears well-developed and well-nourished.  HENT:  Head: Normocephalic and atraumatic.  Right Ear: External ear normal.  Left Ear: External ear normal.  Mouth/Throat: No oropharyngeal exudate (mm dry).  Eyes: Conjunctivae are normal. Right eye exhibits no discharge. Left eye exhibits no discharge. No scleral icterus.  Neck: Neck supple. No tracheal deviation present.  Cardiovascular: Normal rate, regular rhythm and intact distal pulses.   Pulmonary/Chest: Effort normal and breath sounds normal. No stridor. No respiratory distress. He has no wheezes. He has no rales.  Abdominal: Soft. Bowel sounds are normal. He exhibits no distension, no ascites and no mass. There is tenderness in the right lower quadrant and suprapubic area. There is no rebound and no guarding. No hernia.  Musculoskeletal: He exhibits no edema and no tenderness.  Neurological: He is alert. He has normal strength. No sensory deficit. Cranial nerve deficit:  no gross defecits noted. He exhibits normal muscle tone. He displays no seizure activity. Coordination normal.  Skin: Skin is warm. No rash noted. He is diaphoretic.  Psychiatric: He has  a normal mood and affect.    ED Course  Procedures (including critical care time)  Medications  0.9 %  sodium chloride infusion (0 mL Intravenous Stopped 10/21/11 2254)    Followed by  0.9 %  sodium chloride infusion (0 mL Intravenous Stopped 10/21/11 2254)    Followed by  0.9 %  sodium chloride infusion (1000 mL Intravenous New Bag/Given 10/21/11 2253)  cefTRIAXone (ROCEPHIN) 1 g in dextrose 5 % 50 mL IVPB (1 g  Intravenous Given 10/21/11 2325)  HYDROmorphone (DILAUDID) injection 1 mg (1 mg Intravenous Given 10/21/11 1951)  ondansetron (ZOFRAN) injection 4 mg (4 mg Intravenous Given 10/21/11 1951)  acetaminophen (TYLENOL) tablet 1,000 mg (1000 mg Oral Given 10/21/11 2104)  iohexol (OMNIPAQUE) 300 MG/ML solution 80 mL (80 mL Intravenous Contrast Given 10/21/11 2233)    Labs Reviewed  URINALYSIS, ROUTINE W REFLEX MICROSCOPIC - Abnormal; Notable for the following:    Specific Gravity, Urine 1.043 (*)    Glucose, UA >1000 (*)    Ketones, ur >80 (*)    Protein, ur 100 (*)    All other components within normal limits  GLUCOSE, CAPILLARY - Abnormal; Notable for the following:    Glucose-Capillary 301 (*)    All other components within normal limits  CBC - Abnormal; Notable for the following:    WBC 11.5 (*)    All other components within normal limits  DIFFERENTIAL - Abnormal; Notable for the following:    Neutrophils Relative 81 (*)    Neutro Abs 9.3 (*)    Lymphocytes Relative 6 (*)    Lymphs Abs 0.6 (*)    Monocytes Relative 13 (*)    Monocytes Absolute 1.5 (*)    All other components within normal limits  COMPREHENSIVE METABOLIC PANEL - Abnormal; Notable for the following:    Sodium 125 (*)    Chloride 87 (*)    Glucose, Bld 310 (*)    Albumin 3.3 (*)    All other components within normal limits  BLOOD GAS, VENOUS - Abnormal; Notable for the following:    pH, Ven 7.371 (*)    pCO2, Ven 42.3 (*)    pO2, Ven 26.5 (*)    All other components within normal limits  URINE MICROSCOPIC-ADD ON - Abnormal; Notable for the following:    Bacteria, UA MANY (*)    Casts GRANULAR CAST (*)    All other components within normal limits  LIPASE, BLOOD  URINE CULTURE   Ct Abdomen Pelvis W Contrast  10/21/2011  *RADIOLOGY REPORT*  Clinical Data: Abdominal pain and fever.  CT ABDOMEN AND PELVIS WITH CONTRAST  Technique:  Multidetector CT imaging of the abdomen and pelvis was performed following the standard  protocol during bolus administration of intravenous contrast.  Contrast: 44mL OMNIPAQUE IOHEXOL 300 MG/ML  SOLN  Comparison: No priors.  Findings:  Lung Bases: There is airspace consolidation with air bronchograms in the left lower lobe, concerning for pneumonia. There is a small hiatal hernia.  Abdomen/Pelvis:  Enhanced appearance of the liver, gallbladder, pancreas, spleen and bilateral adrenal glands is unremarkable. There is subtle areas of decreased enhancement in the posterior aspects of the kidneys bilaterally (image 31 of series 2 on the right and image 25 of series 2 on the left).  There is a trace volume of free fluid in the pelvis which is unusual in a male patient.  No larger volume of ascites.  No pneumoperitoneum.  No pathologic distension of bowel.  No definite pathologic lymphadenopathy identified  within the abdomen or pelvis.  Prostate urinary bladder are unremarkable in appearance.  Musculoskeletal: There are no aggressive appearing lytic or blastic lesions noted in the visualized portions of the skeleton.  IMPRESSION: 1.  The unusual areas of apparent the decreased perfusion in the posterior aspect of the kidneys bilaterally are nonspecific, but favored to represent sequelae of pyelonephritis. Another differential consideration would include small renal infarctions. Correlation with urinalysis is recommended. 2.  Small volume of free fluid in the anatomic pelvis may be reactive to #1 above.  Otherwise, no acute findings in the abdomen or pelvis are noted to account for this free fluid in the pelvis which is highly unusual in a male patient. 3.  Airspace consolidation with air bronchograms in the left lower lobe concerning for left lower lobe pneumonia. 4.  Small hiatal hernia.  Original Report Authenticated By: Etheleen Mayhew, M.D.     1. Pyelonephritis       MDM  Pt with bacturia and CT evidence suggesting pyelonephritis.  CT suggest possibility of pna.  Will add on zithromax to  cover for CAP.   Pt's fever and blood pressure and tachycardia has improved with IV fluids. Will admit to [facility] hospital.        Kathalene Frames, MD 10/21/11 [phone]

## 2011-10-21 NOTE — ED Notes (Signed)
Phone given to pt to talk to mom

## 2011-10-22 ENCOUNTER — Encounter (HOSPITAL_COMMUNITY): Payer: Self-pay | Admitting: Internal Medicine

## 2011-10-22 ENCOUNTER — Inpatient Hospital Stay (HOSPITAL_COMMUNITY): Payer: Self-pay

## 2011-10-22 DIAGNOSIS — J189 Pneumonia, unspecified organism: Secondary | ICD-10-CM

## 2011-10-22 DIAGNOSIS — N12 Tubulo-interstitial nephritis, not specified as acute or chronic: Secondary | ICD-10-CM | POA: Diagnosis present

## 2011-10-22 DIAGNOSIS — E871 Hypo-osmolality and hyponatremia: Secondary | ICD-10-CM | POA: Diagnosis present

## 2011-10-22 HISTORY — DX: Pneumonia, unspecified organism: J18.9

## 2011-10-22 LAB — BASIC METABOLIC PANEL
BUN: 11 mg/dL (ref 6–23)
BUN: 11 mg/dL (ref 6–23)
BUN: 11 mg/dL (ref 6–23)
BUN: 8 mg/dL (ref 6–23)
CO2: 22 mEq/L (ref 19–32)
CO2: 21 mEq/L (ref 19–32)
CO2: 23 mEq/L (ref 19–32)
CO2: 24 mEq/L (ref 19–32)
Calcium: 8.4 mg/dL (ref 8.4–10.5)
Calcium: 8.2 mg/dL — ABNORMAL LOW (ref 8.4–10.5)
Calcium: 7.9 mg/dL — ABNORMAL LOW (ref 8.4–10.5)
Calcium: 7.8 mg/dL — ABNORMAL LOW (ref 8.4–10.5)
Chloride: 91 mEq/L — ABNORMAL LOW (ref 96–112)
Chloride: 91 mEq/L — ABNORMAL LOW (ref 96–112)
Chloride: 90 mEq/L — ABNORMAL LOW (ref 96–112)
Chloride: 96 mEq/L (ref 96–112)
Creatinine, Ser: 0.73 mg/dL (ref 0.50–1.35)
Creatinine, Ser: 0.7 mg/dL (ref 0.50–1.35)
Creatinine, Ser: 0.65 mg/dL (ref 0.50–1.35)
Creatinine, Ser: 0.74 mg/dL (ref 0.50–1.35)
GFR calc Af Amer: >90 mL/min (ref >90)
GFR calc Af Amer: >90 mL/min (ref >90)
GFR calc Af Amer: >90 mL/min (ref >90)
GFR calc Af Amer: >90 mL/min (ref >90)
GFR calc non Af Amer: >90 mL/min (ref >90)
GFR calc non Af Amer: >90 mL/min (ref >90)
GFR calc non Af Amer: >90 mL/min (ref >90)
GFR calc non Af Amer: >90 mL/min (ref >90)
Glucose, Bld: 243 mg/dL — ABNORMAL HIGH (ref 70–99)
Glucose, Bld: 261 mg/dL — ABNORMAL HIGH (ref 70–99)
Glucose, Bld: 157 mg/dL — ABNORMAL HIGH (ref 70–99)
Glucose, Bld: 222 mg/dL — ABNORMAL HIGH (ref 70–99)
Potassium: 4.2 mEq/L (ref 3.5–5.1)
Potassium: 4.2 mEq/L (ref 3.5–5.1)
Potassium: 3.5 mEq/L (ref 3.5–5.1)
Potassium: 3.2 mEq/L — ABNORMAL LOW (ref 3.5–5.1)
Sodium: 127 mEq/L — ABNORMAL LOW (ref 135–145)
Sodium: 125 mEq/L — ABNORMAL LOW (ref 135–145)
Sodium: 126 mEq/L — ABNORMAL LOW (ref 135–145)
Sodium: 127 mEq/L — ABNORMAL LOW (ref 135–145)

## 2011-10-22 LAB — CBC
HCT: 40.4 % (ref 39.0–52.0)
Hemoglobin: 14.5 g/dL (ref 13.0–17.0)
MCH: 28.5 pg (ref 26.0–34.0)
MCHC: 35.9 g/dL (ref 30.0–36.0)
MCV: 79.4 fL (ref 78.0–100.0)
Platelets: 208 10*3/uL (ref 150–400)
RBC: 5.09 MIL/uL (ref 4.22–5.81)
RDW: 11.9 % (ref 11.5–15.5)
WBC: 12 10*3/uL — ABNORMAL HIGH (ref 4.0–10.5)

## 2011-10-22 LAB — GLUCOSE, CAPILLARY
Glucose-Capillary: 262 mg/dL — ABNORMAL HIGH (ref 70–99)
Glucose-Capillary: 257 mg/dL — ABNORMAL HIGH (ref 70–99)
Glucose-Capillary: 272 mg/dL — ABNORMAL HIGH (ref 70–99)
Glucose-Capillary: 215 mg/dL — ABNORMAL HIGH (ref 70–99)
Glucose-Capillary: 221 mg/dL — ABNORMAL HIGH (ref 70–99)
Glucose-Capillary: 157 mg/dL — ABNORMAL HIGH (ref 70–99)
Glucose-Capillary: 174 mg/dL — ABNORMAL HIGH (ref 70–99)
Glucose-Capillary: 65 mg/dL — ABNORMAL LOW (ref 70–99)
Glucose-Capillary: 216 mg/dL — ABNORMAL HIGH (ref 70–99)

## 2011-10-22 LAB — HEMOGLOBIN A1C
Hgb A1c MFr Bld: 13.6 % — ABNORMAL HIGH (ref <5.7)
Mean Plasma Glucose: 344 mg/dL — ABNORMAL HIGH (ref <117)

## 2011-10-22 LAB — HIV ANTIBODY (ROUTINE TESTING W REFLEX): HIV: NONREACTIVE

## 2011-10-22 LAB — SODIUM, URINE, RANDOM: Sodium, Ur: 28 mEq/L

## 2011-10-22 LAB — TSH: TSH: 0.788 u[IU]/mL (ref 0.350–4.500)

## 2011-10-22 LAB — MAGNESIUM: Magnesium: 1.8 mg/dL (ref 1.5–2.5)

## 2011-10-22 MED ORDER — IBUPROFEN 400 MG PO TABS
400.0000 mg | ORAL_TABLET | ORAL | Status: DC | PRN
  Administered 2011-10-22 – 2011-10-23 (×2): 400 mg via ORAL
  Filled 2011-10-22 (×2): qty 1

## 2011-10-22 MED ORDER — POTASSIUM CHLORIDE CRYS ER 20 MEQ PO TBCR
40.0000 meq | EXTENDED_RELEASE_TABLET | Freq: Once | ORAL | Status: AC
  Administered 2011-10-23: 40 meq via ORAL
  Filled 2011-10-22: qty 2

## 2011-10-22 MED ORDER — SODIUM CHLORIDE 0.9 % IJ SOLN
3.0000 mL | Freq: Two times a day (BID) | INTRAMUSCULAR | Status: DC
  Administered 2011-10-23 – 2011-10-25 (×3): 3 mL via INTRAVENOUS

## 2011-10-22 MED ORDER — DEXTROSE 5 % IV SOLN
500.0000 mg | INTRAVENOUS | Status: DC
  Administered 2011-10-23 – 2011-10-24 (×2): 500 mg via INTRAVENOUS
  Filled 2011-10-22 (×3): qty 500

## 2011-10-22 MED ORDER — ACETAMINOPHEN 325 MG PO TABS
650.0000 mg | ORAL_TABLET | Freq: Once | ORAL | Status: AC
  Administered 2011-10-22: 650 mg via ORAL
  Filled 2011-10-22: qty 2

## 2011-10-22 MED ORDER — DEXTROSE 5 % IV SOLN
1.0000 g | Freq: Three times a day (TID) | INTRAVENOUS | Status: DC
  Administered 2011-10-23: 1 g via INTRAVENOUS
  Filled 2011-10-22 (×3): qty 1

## 2011-10-22 MED ORDER — ONDANSETRON HCL 4 MG PO TABS: 4.0000 mg | ORAL_TABLET | Freq: Four times a day (QID) | ORAL | Status: DC | PRN

## 2011-10-22 MED ORDER — ONDANSETRON HCL 4 MG/2ML IJ SOLN: 4.0000 mg | Freq: Four times a day (QID) | INTRAMUSCULAR | Status: DC | PRN

## 2011-10-22 MED ORDER — DEXTROSE 5 % IV SOLN
1.0000 g | INTRAVENOUS | Status: AC
  Administered 2011-10-22: 1 g via INTRAVENOUS
  Filled 2011-10-22: qty 1

## 2011-10-22 MED ORDER — INSULIN ASPART 100 UNIT/ML ~~LOC~~ SOLN
0.0000 [IU] | Freq: Three times a day (TID) | SUBCUTANEOUS | Status: DC
  Administered 2011-10-22: 13:00:00 via SUBCUTANEOUS
  Administered 2011-10-22: 2 [IU] via SUBCUTANEOUS
  Administered 2011-10-22 – 2011-10-23 (×2): 3 [IU] via SUBCUTANEOUS
  Administered 2011-10-23: 5 [IU] via SUBCUTANEOUS
  Administered 2011-10-23: 3 [IU] via SUBCUTANEOUS

## 2011-10-22 MED ORDER — AZITHROMYCIN 500 MG PO TABS
500.0000 mg | ORAL_TABLET | Freq: Every day | ORAL | Status: DC
  Administered 2011-10-22: 500 mg via ORAL
  Filled 2011-10-22: qty 1

## 2011-10-22 MED ORDER — SODIUM CHLORIDE 0.9 % IV SOLN
1000.0000 mL | INTRAVENOUS | Status: DC
  Administered 2011-10-22 – 2011-10-24 (×3): 1000 mL via INTRAVENOUS

## 2011-10-22 MED ORDER — INSULIN ASPART 100 UNIT/ML ~~LOC~~ SOLN
4.0000 [IU] | Freq: Once | SUBCUTANEOUS | Status: AC
  Administered 2011-10-22: 4 [IU] via SUBCUTANEOUS

## 2011-10-22 MED ORDER — INSULIN ASPART PROT & ASPART (70-30 MIX) 100 UNIT/ML ~~LOC~~ SUSP
30.0000 [IU] | Freq: Two times a day (BID) | SUBCUTANEOUS | Status: DC
  Administered 2011-10-22 – 2011-10-25 (×7): 30 [IU] via SUBCUTANEOUS
  Filled 2011-10-22: qty 3
  Filled 2011-10-22: qty 10
  Filled 2011-10-22: qty 3

## 2011-10-22 MED ORDER — ACETAMINOPHEN 325 MG PO TABS
650.0000 mg | ORAL_TABLET | Freq: Four times a day (QID) | ORAL | Status: DC | PRN
  Administered 2011-10-22 – 2011-10-25 (×5): 650 mg via ORAL
  Filled 2011-10-22 (×5): qty 2

## 2011-10-22 MED ORDER — ACETAMINOPHEN 650 MG RE SUPP: 650.0000 mg | Freq: Four times a day (QID) | RECTAL | Status: DC | PRN

## 2011-10-22 MED ORDER — ENOXAPARIN SODIUM 40 MG/0.4ML ~~LOC~~ SOLN
40.0000 mg | SUBCUTANEOUS | Status: DC
  Administered 2011-10-22 – 2011-10-25 (×4): 40 mg via SUBCUTANEOUS
  Filled 2011-10-22 (×4): qty 0.4

## 2011-10-22 MED ORDER — VANCOMYCIN HCL IN DEXTROSE 1-5 GM/200ML-% IV SOLN
1000.0000 mg | INTRAVENOUS | Status: AC
  Administered 2011-10-22: 1000 mg via INTRAVENOUS
  Filled 2011-10-22: qty 200

## 2011-10-22 MED ORDER — VANCOMYCIN HCL IN DEXTROSE 1-5 GM/200ML-% IV SOLN
1000.0000 mg | Freq: Three times a day (TID) | INTRAVENOUS | Status: DC
  Administered 2011-10-23 – 2011-10-24 (×5): 1000 mg via INTRAVENOUS
  Filled 2011-10-22 (×10): qty 200

## 2011-10-22 MED ORDER — INSULIN ASPART 100 UNIT/ML ~~LOC~~ SOLN
5.0000 [IU] | Freq: Once | SUBCUTANEOUS | Status: AC
  Administered 2011-10-22: 5 [IU] via SUBCUTANEOUS

## 2011-10-22 MED ORDER — MORPHINE SULFATE 2 MG/ML IJ SOLN: 2.0000 mg | INTRAMUSCULAR | Status: DC | PRN

## 2011-10-22 NOTE — Progress Notes (Signed)
Subjective: The patient is lying in bed this morning and states he did not sleep well last night. He is denying any pain anywhere. He is not feeling nauseous and has not vomited overnight. No diarrhea. No chest pain, SOB. Having some cough, non-productive. Denies abdominal pain. Did eat some breakfast this morning. States he feels somewhat better than yesterday. States he was taking his insulin at home as prescribed.   Objective: Vital signs in last 24 hours: Filed Vitals:   10/22/11 0620 10/22/11 0622 10/22/11 0624 10/22/11 0626  BP: 105/59 120/77 123/82 120/79  Pulse: 92 109 116 113  Temp:      TempSrc:      Resp:      Height:      Weight:      SpO2:       Weight change:   Intake/Output Summary (Last 24 hours) at 10/22/11 1141 Last data filed at 10/22/11 0132  Gross per 24 hour  Intake   2500 ml  Output   1400 ml  Net   1100 ml   General: resting in bed, lying under the covers HEENT: PERRL, EOMI, no scleral icterus Cardiac: RRR, no rubs, murmurs or gallops Pulm: clear to auscultation bilaterally, moving normal volumes of air Abd: soft, nontender, nondistended, BS present Ext: warm and well perfused, no pedal edema Neuro: alert and oriented X3, cranial nerves II-XII grossly intact  Lab Results: Basic Metabolic Panel:  Lab [id number] 0912 10/22/11 0520  NA 125* 127*  K 4.2 4.2  CL 91* 91*  CO2 21 22  GLUCOSE 261* 243*  BUN 11 11  CREATININE 0.70 0.73  CALCIUM 8.2* 8.4  MG -- 1.8  PHOS -- --   Liver Function Tests:  Lab 10/21/11 1940  AST 18  ALT 12  ALKPHOS 107  BILITOT 0.3  PROT 8.3  ALBUMIN 3.3*    Lab 10/21/11 1940  LIPASE 11  AMYLASE --   CBC:  Lab 10/22/11 0520 10/21/11 1940  WBC 12.0* 11.5*  NEUTROABS -- 9.3*  HGB 14.5 15.2  HCT 40.4 44.4  MCV 79.4 80.4  PLT 208 225   CBG:  Lab 10/22/11 1132 10/22/11 0721 10/22/11 0616 10/22/11 0422 10/22/11 0232 10/22/11 0111  GLUCAP 157* 221* 215* 272* 257* 262*   Urinalysis:  Lab 10/21/11 1837    COLORURINE YELLOW  LABSPEC 1.043*  PHURINE 5.5  GLUCOSEU >1000*  HGBUR NEGATIVE  BILIRUBINUR NEGATIVE  KETONESUR >80*  PROTEINUR 100*  UROBILINOGEN 1.0  NITRITE NEGATIVE  LEUKOCYTESUR NEGATIVE   Studies/Results: X-ray Chest Pa And Lateral   10/22/2011  *RADIOLOGY REPORT*  Clinical Data: Fever and cough.  CHEST - 2 VIEW  Comparison: 06/15/2008.  Findings: The cardiac silhouette, mediastinal and hilar contours are within normal limits and stable.  There is ill-defined airspace opacity at the left lung base consistent with pneumonia.  The right lung is clear.  IMPRESSION: Left lower lobe infiltrate.  Original Report Authenticated By: P. Kalman Jewels, M.D.   Ct Abdomen Pelvis W Contrast  10/21/2011  *RADIOLOGY REPORT*  Clinical Data: Abdominal pain and fever.  CT ABDOMEN AND PELVIS WITH CONTRAST  Technique:  Multidetector CT imaging of the abdomen and pelvis was performed following the standard protocol during bolus administration of intravenous contrast.  Contrast: 21mL OMNIPAQUE IOHEXOL 300 MG/ML  SOLN  Comparison: No priors.  Findings:  Lung Bases: There is airspace consolidation with air bronchograms in the left lower lobe, concerning for pneumonia. There is a small hiatal hernia.  Abdomen/Pelvis:  Enhanced  appearance of the liver, gallbladder, pancreas, spleen and bilateral adrenal glands is unremarkable. There is subtle areas of decreased enhancement in the posterior aspects of the kidneys bilaterally (image 31 of series 2 on the right and image 25 of series 2 on the left).  There is a trace volume of free fluid in the pelvis which is unusual in a male patient.  No larger volume of ascites.  No pneumoperitoneum.  No pathologic distension of bowel.  No definite pathologic lymphadenopathy identified within the abdomen or pelvis.  Prostate urinary bladder are unremarkable in appearance.  Musculoskeletal: There are no aggressive appearing lytic or blastic lesions noted in the visualized portions of  the skeleton.  IMPRESSION: 1.  The unusual areas of apparent the decreased perfusion in the posterior aspect of the kidneys bilaterally are nonspecific, but favored to represent sequelae of pyelonephritis. Another differential consideration would include small renal infarctions. Correlation with urinalysis is recommended. 2.  Small volume of free fluid in the anatomic pelvis may be reactive to #1 above.  Otherwise, no acute findings in the abdomen or pelvis are noted to account for this free fluid in the pelvis which is highly unusual in a male patient. 3.  Airspace consolidation with air bronchograms in the left lower lobe concerning for left lower lobe pneumonia. 4.  Small hiatal hernia.  Original Report Authenticated By: Etheleen Mayhew, M.D.   Medications: I have reviewed the patient's current medications. Scheduled Meds:   . sodium chloride  1,000 mL Intravenous Once   Followed by  . sodium chloride  1,000 mL Intravenous Once  . acetaminophen  1,000 mg Oral Once  . cefTRIAXone (ROCEPHIN)  IV  1 g Intravenous Q24H  . enoxaparin  40 mg Subcutaneous Q24H  . HYDROmorphone  1 mg Intravenous Once  . insulin aspart  0-9 Units Subcutaneous TID WC  . insulin aspart  4 Units Subcutaneous Once  . insulin aspart  5 Units Subcutaneous Once  . insulin aspart protamine-insulin aspart  30 Units Subcutaneous BID WC  . ondansetron  4 mg Intravenous Once  . sodium chloride  3 mL Intravenous Q12H   Continuous Infusions:   . sodium chloride 1,000 mL (10/22/11 0640)   PRN Meds:.acetaminophen, acetaminophen, iohexol, morphine injection, ondansetron (ZOFRAN) IV, ondansetron Assessment/Plan:  Questionable Pyelonephritis - Pt admitted without abdominal pain and without urinary symptoms and urinalysis was fairly benign. Is receiving ceftriaxone to cover him. No fevers since admission but will continue to follow closely.   CAP (community acquired pneumonia) - On CXR showed some area and abdominal CT showed  pneumonia so are treating as community acquired with ceftriaxone and azithromycin. Afebrile <24 hours. Has had some cough at home as well.   Type I diabetes mellitus with ketoacidosis, uncontrolled - Is getting insulin 70/30 twice daily 30 units. Blood sugars are not quite controlled in the setting of acute infection which likely precipitated the episode of DKA. AG is 13 as of this morning and was not on insulin drip during hospital stay. Continuing to give fluids as he starts eating as he is likely still fluid depleted.   ESSENTIAL HYPERTENSION - Blood pressure controlled by diet at home and has not been high during this stay. Will treat if needed.     Hyponatremia - The patient has hyponatremia due to decreased fluid state and continues to get fluids. Will continue to follow and several urine studies ordered.   Disposition - The patient is still needing in-pt therapy for his infection and  diabetes.     LOS: 1 day   Vertell Novak 10/22/2011, 11:41 AM

## 2011-10-22 NOTE — Progress Notes (Addendum)
Internal Medicine Teaching Service Night Float Progress Note.  Called by patient's RN regarding patient's status.  Temp remains elevated at 102, even after tylenol.  Blood pressure 109/71 after fluid bolus, HR 90-100s, RR 20, 93% RA (from 97% earlier on during the day).  Subjective: Patient feels ill, but about the same as from admission last night. Abdominal pain improved, but feels persistently weak. He reports dizziness with standing as well as generalized HA without vision changes.  No SOB, but reports nonproductive cough.  Objective: Filed Vitals:   10/22/11 1947  BP: 93/56  Pulse: 90  Temp: 102.2 F (39 C)  Resp: 18  O2 Sat 93% RA  General: resting in bed Cardiac: RRR, no rubs, murmurs or gallops Pulm: clear to auscultation bilaterally, decreased air mvmt b/l Abd: soft, nontender, nondistended, BS normoactive Ext: warm and well perfused, no pedal edema Neuro: alert and oriented X3, cranial nerves II-XII grossly intact  Assessment/Plan: -Given persistent fever (since around 1pm) & hypotension, as well as relative desaturation on room air, patient will be transferred to SDU for close observation through the night.   -Increase IVF to 200cc/h, follow up on urine osm, and check BMET at 11pm given hyponatremia -Add ibuprofen to alternate with tylenol to manage fever (this will also help with HA) -Change antibiotics to Vanc for MRSA, Ceftaz for pseudomonal/gram neg coverage and continue Azithro for atypical coverage

## 2011-10-22 NOTE — H&P (Addendum)
Hospital Admission Note Date: 10/22/2011  Patient name: [name] Medical record number: [id number] Date of birth: 1970/09/29 Age: 41 y.o. Gender: male PCP: Ivor Costa, MD, MD  Medical Service: Internal Medicine Teaching Service  Attending physician: Dr. Drucilla Schmidt    1st Contact: Dr. Nicoletta Dress    Pager: [phone] 2nd Contact: Dr. Leonia Reeves   Pager: [phone] After 5 pm or weekends: 1st Contact:      Pager: [phone] 2nd Contact:      Pager: [phone]  Chief Complaint: Abdominal Pain  History of Present Illness: Patient is a 40 yo man with PMH significant for uncontrolled diabetes who presents with acute onset, non-radiating, [id number], periumbilical/suprapubic pain, described as dull/achy. Pain began 3d PTA, and he is unable to identify an inciting incident.  Accompanying symptoms include feeling sleepy all day and decreased appetite, though he has been able to drink fluids.  He denies N/V/diarrhea/constipation.  He reports chills and subjective fever. He denies dysuria/hematuria, but does report increased urinary frequency and urgency.  He has never felt similar symptoms in the past.  He reports pain improved with IV pain medications given in the ED, but has not tried any medications at home.  Pain is worse with standing, but not clearly related to food. Finally, he reports a nonproductive cough which began on day of admission.  He is sexually active with females, but denies recent sexual activity. Denies penile discharge.  He was urged to come to the hospital by his mother.   Regarding his DM, he does report compliance with Insulin, 30u BID.  Meds: Medications Prior to Admission  Medication Sig Dispense Refill  . insulin NPH-insulin regular (NOVOLIN 70/30) (70-30) 100 UNIT/ML injection Inject 30 Units into the skin 2 (two) times daily with a meal.          Allergies: Allergies as of 10/21/2011  . (No Known Allergies)   Past Medical History  Diagnosis Date  . Diabetes mellitus type II, uncontrolled  07/17/2006  . ESSENTIAL HYPERTENSION 07/17/2006  . HYPERLIPIDEMIA 11/24/2008   Past Surgical History  Procedure Date  . Arm surgery    Family History  Problem Relation Age of Onset  . Hypertension Mother 53  . Cancer Mother     bladder cancer  . Alcohol abuse Father   . Hypertension Brother    History   Social History  . Marital Status: Married    Spouse Name: N/A    Number of Children: N/A  . Years of Education: N/A   Occupational History  . 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 Smoking status: Never Smoker   . Smokeless tobacco: Not on file  . Alcohol Use: No  . Drug Use: No  . Sexually Active: Yes -- Male partner(s)     Not sexually active since April 2013   Other Topics Concern  . 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    Review of Systems: General: no changes in weight Skin: no rash HEENT: no blurry vision, hearing changes, sore throat Pulm: no dyspnea, wheezing CV: no chest pain, palpitations, shortness of breath Abd: as per HPI GU: no dysuria, hematuria, + polyuria Ext: no arthralgias, myalgias Neuro: no weakness, numbness, or tingling   Physical Exam: Blood pressure 115/75, pulse 91, temperature  99.9 F (37.7 C), temperature source Oral, resp. rate 18, height 5\' 7"  (1.702 m), weight 144 lb 10 oz (65.6 kg), SpO2 96.00%.  Tmax = 103.2 General: resting in bed, no acute distress, cooperative to exam HEENT: PERRL, EOMI, no scleral icterus Cardiac: RRR, no rubs, murmurs or gallops Pulm: clear to auscultation bilaterally, moving normal volumes of air Abd: soft, minimally to almost nontender of suprapubic/umbilical region, nondistended, BS normoactive, no CVA tenderness, no paraspinal tenderness Ext: warm and well perfused, no pedal edema Neuro: alert and oriented X3, cranial nerves II-XII  grossly intact   Lab results: Basic Metabolic Panel:  Basename 10/21/11 1940  NA 125*  K 4.7  CL 87*  CO2 21  GLUCOSE 310*  BUN 13  CREATININE 0.75  CALCIUM 9.1  MG --  PHOS --   Liver Function Tests:  Basename 10/21/11 1940  AST 18  ALT 12  ALKPHOS 107  BILITOT 0.3  PROT 8.3  ALBUMIN 3.3*    Basename 10/21/11 1940  LIPASE 11  AMYLASE --   CBC:  Basename 10/21/11 1940  WBC 11.5*  NEUTROABS 9.3*  HGB 15.2  HCT 44.4  MCV 80.4  PLT 225   CBG:  Basename 10/22/11 0111 10/21/11 1827  GLUCAP 262* 301*   Urinalysis:  Basename 10/21/11 1837  COLORURINE YELLOW  LABSPEC 1.043*  PHURINE 5.5  GLUCOSEU >1000*  HGBUR NEGATIVE  BILIRUBINUR NEGATIVE  KETONESUR >80*  PROTEINUR 100*  UROBILINOGEN 1.0  NITRITE NEGATIVE  LEUKOCYTESUR NEGATIVE   Urine Micro: Results for DARRELL, EMRICH (MRN [id number]) as of 10/22/2011 02:18  Ref. Range 10/21/2011 18:37  Bacteria, UA Latest Range: RARE  MANY (A)  Casts Latest Range: NEGATIVE  GRANULAR CAST (A)    Imaging results:  Ct Abdomen Pelvis W Contrast  10/21/2011  *RADIOLOGY REPORT*  Clinical Data: Abdominal pain and fever.  CT ABDOMEN AND PELVIS WITH CONTRAST  Technique:  Multidetector CT imaging of the abdomen and pelvis was performed following the standard protocol during bolus administration of intravenous contrast.  Contrast: 32mL OMNIPAQUE IOHEXOL 300 MG/ML  SOLN  Comparison: No priors.  Findings:  Lung Bases: There is airspace consolidation with air bronchograms in the left lower lobe, concerning for pneumonia. There is a small hiatal hernia.  Abdomen/Pelvis:  Enhanced appearance of the liver, gallbladder, pancreas, spleen and bilateral adrenal glands is unremarkable. There is subtle areas of decreased enhancement in the posterior aspects of the kidneys bilaterally (image 31 of series 2 on the right and image 25 of series 2 on the left).  There is a trace volume of free fluid in the pelvis which is unusual in a male  patient.  No larger volume of ascites.  No pneumoperitoneum.  No pathologic distension of bowel.  No definite pathologic lymphadenopathy identified within the abdomen or pelvis.  Prostate urinary bladder are unremarkable in appearance.  Musculoskeletal: There are no aggressive appearing lytic or blastic lesions noted in the visualized portions of the skeleton.  IMPRESSION: 1.  The unusual areas of apparent the decreased perfusion in the posterior aspect of the kidneys bilaterally are nonspecific, but favored to represent sequelae of pyelonephritis. Another differential consideration would include small renal infarctions. Correlation with urinalysis is recommended. 2.  Small volume of free fluid in the anatomic pelvis may be reactive to #1 above.  Otherwise, no acute findings in the abdomen or pelvis are noted to account for this free fluid in the pelvis which is highly unusual in a male patient. 3.  Airspace consolidation  with air bronchograms in the left lower lobe concerning for left lower lobe pneumonia. 4.  Small hiatal hernia.  Original Report Authenticated By: Etheleen Mayhew, M.D.    Assessment & Plan by Problem:  #DKA: At arrival to [facility], CBG=301, with AG= 17 and ketones in his urine. He has history of uncontrolled Type 1 DM.  Last HbA1c >14 on 05/02/11.  He reports that he has recently been employed, and has been compliant with insulin 70/30 30units BID.  Possible infectious inciting incidents listed below.  At admission to [facility], CBG already down to 257. -Admit to tele -CBG q4h, BMET with AML to monitor gap -Continue home dose 70/30 30u BID with sensi SSI -Recheck HbA1c -Carb mod diet   #Abdominal Pain : May be related to DKA. Symptoms of fever, polyuria & urgency, along with many bacteria on urine micro, leukocytosis and CT scan results suggest pyelonephritis.  Lipase was wnl, and he denies N/V and epigastric pain, thus pancreatitis is less likely.  Hgb urine dipstick was negative and thus  nephrolithiasis less likely.   -Blood culture -Follow urine culture -check HIV status -Continue Ceftriaxone -Pain mgmt with Tylenol -Nausea mgmt with zofran  #Hyponatremia: Sodium corrects to 128. Likely related to decreased PO intake/dehydration, given high specific gravity of urine.  Labs do not suggest liver or renal failure, and he has no history heart failure. He is not on any medications that would cause hyponatremia.  Less likely, lung pathology may be contributing to SIADH. -Urine osm & Na -Orthostatic Vital signs -NS @ 125cc/h -AM BMET -check TSH  #Community Acquired Pneumonia: Patient complains of nonproductive dry cough x1 day, and CT scan suggests LLL consolidation with air bronchograms concerning for PNA.  -Treat with Azithromax -PA and lateral CXR  #Hypertension: BP currently well controlled.  He is not on an antihypertensive regimen, and chart review suggests that blood pressure has been diet controlled (low salt). -Continue to monitor  #VTE ppx: Lovenox   SignedPryor Ochoa 10/22/2011, 3:06 AM   Internal Medicine Teaching Service Attending Note Date: 10/22/2011  Patient name: DAISUKE ELM  Medical record number: [id number]  Date of birth: 1971/03/14    This patient has been seen and discussed with the house staff. Please see their note for complete details. I concur with their findings with the following additions/corrections: 41 year old African American male with diabetes mellitus that is poorly controlled admitted with diabetic keto acidosis fevers and found to have left lower lobe consolidation consistent with bronchopneumonia also found to have hypoperfusion in his bilateral kidneys on CT scan imaging. Overnight after having been given Rocephin and azithromycin he has had fevers on the floor to 103. I would favor a bronchopneumonia being the cause of his fevers and possibly worsening his diabetic control. The findings on CT scan of the kidneys are of  uncertain etiology. We'll obtain blood cultures, check urine Legionella antigen HIV Eliza antibody and follow clinically. We may ultimately want to consider an MRI of the kidneys at some point in the future.  Rhina Brackett Dam 10/22/2011, 2:16 PM

## 2011-10-22 NOTE — Progress Notes (Signed)
Utilization review completed.  

## 2011-10-22 NOTE — Progress Notes (Signed)
ANTIBIOTIC CONSULT NOTE - INITIAL  Pharmacy Consult for Vanco/Fortaz Indication: pneumonia  No Known Allergies  Patient Measurements: Height: 5\' 7"  (170.2 cm) Weight: 144 lb 10 oz (65.6 kg) IBW/kg (Calculated) : 66.1  Adjusted Body Weight:    Vital Signs: Temp: 102.2 F (39 C) (06/04 1947) Temp src: Oral (06/04 1947) BP: 93/56 mmHg (06/04 1947) Pulse Rate: 90  (06/04 1947) Intake/Output from previous day: 06/03 0701 - 06/04 0700 In: 2500 [I.V.:2500] Out: 1400 [Urine:1400] Intake/Output from this shift:    Labs:  Basename 10/22/11 1334 10/22/11 0912 10/22/11 0520 10/21/11 1940  WBC -- -- 12.0* 11.5*  HGB -- -- 14.5 15.2  PLT -- -- 208 225  LABCREA -- -- -- --  CREATININE 0.65 0.70 0.73 --   Estimated Creatinine Clearance: 116.2 ml/min (by C-G formula based on Cr of 0.65). No results found for this basename: VANCOTROUGH:2,VANCOPEAK:2,VANCORANDOM:2,GENTTROUGH:2,GENTPEAK:2,GENTRANDOM:2,TOBRATROUGH:2,TOBRAPEAK:2,TOBRARND:2,AMIKACINPEAK:2,AMIKACINTROU:2,AMIKACIN:2, in the last 72 hours   Microbiology: No results found for this or any previous visit (from the past 720 hour(s)).  Medical History: Past Medical History  Diagnosis Date  . Diabetes mellitus type II, uncontrolled 07/17/2006  . ESSENTIAL HYPERTENSION 07/17/2006  . HYPERLIPIDEMIA 11/24/2008  . Pneumonia 10/22/2011    Medications:  Prescriptions prior to admission  Medication Sig Dispense Refill  . insulin NPH-insulin regular (NOVOLIN 70/30) (70-30) 100 UNIT/ML injection Inject 30 Units into the skin 2 (two) times daily with a meal.         Assessment: Fever, hypotension, tachycardia, desaturations on RA. Start abx empirically for PNA. Previously being treated with Rocephin for Pyelo.   Goal of Therapy:  Vancomycin trough level 15-20 mcg/ml  Plan:  Fortaz 1g IV q8hr. Vanco 1g IV q8hr. Trough after 3-5 doses at steady state.  Eilene Ghazi Stillinger 10/22/2011,8:42 PM

## 2011-10-22 NOTE — Progress Notes (Signed)
Patient transferred from 3701 at approximately 2300. VVS, no complaints of pain and assessment WNL. Will continue to monitor patient.  Meribeth Mattes RN 10/22/2011 2330

## 2011-10-22 NOTE — Progress Notes (Signed)
Patient's temperature 103 this afternoon.  Tylenol 650 mg given.  Dr. Doug Sou paged and notified.  Temperature down to 101.8.  Orders for blood cultures obtained.  Will continue to monitor.  [name]

## 2011-10-22 NOTE — Progress Notes (Signed)
Patient's temperature up to 104.4.  Dr. Reola Calkins paged and notified, orders received for 500 cc fluid bolus and repeat tylenol 650 mg x1.  Will continue to monitor.  [name]

## 2011-10-22 NOTE — Progress Notes (Signed)
Patient is a 41 yo man with PMH significant for uncontrolled diabetes who presents with acute onset, non-radiating, [id number], periumbilical/suprapubic pain, described as dull/achy.  Anion gap 17 on admit.  Anion gap now down to 13.  Went in to speak with patient about his poorly controlled sugars.  Patient told me he takes his insulin regularly but does not always check his CBGs b/c the strips are too expensive.  Patient did not appear interested in anything I had to say and kept his eyes closed during the majority of our conversation.  Results for [name], [name] (MRN [id number]) as of 10/22/2011 13:27  Ref. Range 07/17/2006 14:25 06/04/2007 10:04 04/21/2008 09:15 11/24/2008 13:20 10/17/2010 00:00 05/02/2011 15:12 05/02/2011 16:16  Hemoglobin A1C Latest Range: <5.7 % >14.0 13.2 >14.0 >14.0 >14.0 >14.0 13.3 (H)   Based on his A1c levels, patient has been chronically uncontrolled since 2008.  Needs close follow-up and encouragement to improve glucose control in order to prevent long-term complications.  Gave patient information on purchasing an inexpensive CBG meter and strips at [employer].  Meter is $16 and a box of 50 strips is $9.  Would benefit from follow up with Barry Brunner, CDE in the Internal Medicine Clinic.  Will follow. Wyn Quaker RN, MSN, CDE Diabetes Coordinator Inpatient Diabetes Program [phone]

## 2011-10-22 NOTE — ED Notes (Signed)
Unable to take report.  To return call.

## 2011-10-22 NOTE — Progress Notes (Signed)
Patient's vitals signs after fluid bolus and tylenol BP 109/71, heart rate 92, temp 102, respirations 20, 93 room air.  Vitals called to Dr. Victorio Palm.  When I spoke with patients mother earlier today, she did state patient has history of seizures with fever.  Will continue to monitor.  Sanda Linger

## 2011-10-23 ENCOUNTER — Inpatient Hospital Stay (HOSPITAL_COMMUNITY): Payer: Self-pay

## 2011-10-23 DIAGNOSIS — E1110 Type 2 diabetes mellitus with ketoacidosis without coma: Secondary | ICD-10-CM

## 2011-10-23 LAB — CBC
HCT: 37.3 % — ABNORMAL LOW (ref 39.0–52.0)
Hemoglobin: 12.8 g/dL — ABNORMAL LOW (ref 13.0–17.0)
MCH: 27.1 pg (ref 26.0–34.0)
MCHC: 34.3 g/dL (ref 30.0–36.0)
MCV: 79 fL (ref 78.0–100.0)
Platelets: 206 10*3/uL (ref 150–400)
RBC: 4.72 MIL/uL (ref 4.22–5.81)
RDW: 11.9 % (ref 11.5–15.5)
WBC: 7.9 10*3/uL (ref 4.0–10.5)

## 2011-10-23 LAB — GLUCOSE, CAPILLARY
Glucose-Capillary: 299 mg/dL — ABNORMAL HIGH (ref 70–99)
Glucose-Capillary: 231 mg/dL — ABNORMAL HIGH (ref 70–99)
Glucose-Capillary: 202 mg/dL — ABNORMAL HIGH (ref 70–99)
Glucose-Capillary: 184 mg/dL — ABNORMAL HIGH (ref 70–99)

## 2011-10-23 LAB — BASIC METABOLIC PANEL
BUN: 9 mg/dL (ref 6–23)
CO2: 21 mEq/L (ref 19–32)
Calcium: 7.9 mg/dL — ABNORMAL LOW (ref 8.4–10.5)
Chloride: 97 mEq/L (ref 96–112)
Creatinine, Ser: 0.66 mg/dL (ref 0.50–1.35)
GFR calc Af Amer: >90 mL/min (ref >90)
GFR calc non Af Amer: >90 mL/min (ref >90)
Glucose, Bld: 262 mg/dL — ABNORMAL HIGH (ref 70–99)
Potassium: 3.6 mEq/L (ref 3.5–5.1)
Sodium: 129 mEq/L — ABNORMAL LOW (ref 135–145)

## 2011-10-23 LAB — URINE CULTURE
Colony Count: NO GROWTH
Culture  Setup Time: 201306040420
Culture: NO GROWTH

## 2011-10-23 LAB — MRSA PCR SCREENING: MRSA by PCR: NEGATIVE

## 2011-10-23 LAB — OSMOLALITY, URINE: Osmolality, Ur: 392 mOsm/kg (ref 390–1090)

## 2011-10-23 LAB — STREP PNEUMONIAE URINARY ANTIGEN: Strep Pneumo Urinary Antigen: NEGATIVE

## 2011-10-23 MED ORDER — DEXTROSE 5 % IV SOLN
1.0000 g | Freq: Three times a day (TID) | INTRAVENOUS | Status: DC
  Administered 2011-10-23 – 2011-10-24 (×4): 1 g via INTRAVENOUS
  Filled 2011-10-23 (×10): qty 1

## 2011-10-23 MED ORDER — WHITE PETROLATUM GEL
Status: AC
  Administered 2011-10-23: 12:00:00
  Filled 2011-10-23: qty 5

## 2011-10-23 NOTE — Progress Notes (Signed)
Inpatient Diabetes Program Recommendations  AACE/ADA: New Consensus Statement on Inpatient Glycemic Control (2009)  Target Ranges:  Prepandial:   less than 140 mg/dL      Peak postprandial:   less than 180 mg/dL (1-2 hours)      Critically ill patients:  140 - 180 mg/dL   Reason for Visit: Hyperglycemia  Inpatient Diabetes Program Recommendations Insulin - Basal: Total of 60 units 70/30 insulin should be effective.  However, with the Pyelonephritis, the insulin needs are increased. Correction (SSI): Please increase to moderate correction and add HS scale  Note: Thank you, Rosita Kea, RN, CNS, Diabetes Coordinator [phone])

## 2011-10-23 NOTE — Progress Notes (Signed)
ANTIBIOTIC CONSULT NOTE - FOLLOW UP  Pharmacy Consult for cefepime Indication: CAP  No Known Allergies  Patient Measurements: Height: 5\' 7"  (170.2 cm) Weight: 149 lb 0.5 oz (67.6 kg) IBW/kg (Calculated) : 66.1   Vital Signs: Temp: 98.6 F (37 C) (06/05 1201) Temp src: Oral (06/05 0800) BP: 105/57 mmHg (06/05 0800) Pulse Rate: 93  (06/05 0800)  Labs:  Basename 10/23/11 0430 10/22/11 2244 10/22/11 1334 10/22/11 0520 10/21/11 1940  WBC 7.9 -- -- 12.0* 11.5*  HGB 12.8* -- -- 14.5 15.2  PLT 206 -- -- 208 225  LABCREA -- -- -- -- --  CREATININE 0.66 0.74 0.65 -- --   Estimated Creatinine Clearance: 117.1 ml/min (by C-G formula based on Cr of 0.66).   Assessment: 41 yo male with pneumonia to change from ceftaz to cefepime due to clinical worsening (fever with SIRS).  No renal impairment noted.  Cultures 6/4 blood: NGTD 6/3: urine: neg   Plan:  -Begin cefepime 1gm IV q8hr -Will follow renal function and cultures  Hildred Laser, Pharm D 10/23/2011 1:01 PM

## 2011-10-23 NOTE — Progress Notes (Signed)
Internal Medicine Teaching Service Attending Note Date: 10/23/2011  Patient name: [name]  Medical record number: [id number]  Date of birth: 1971/01/29    This patient has been seen and discussed with the house staff. Please see their note for complete details. I concur with their findings with the following additions/corrections:  41 year old man with diabetes mellitus, admitted with fevers and diabetic ketoacidosis found to have left lower lobe pneumonia. Since yesterday morning he had worsening fever with SIRS and was broadened to vancomycin and ceftaz edema and azithromycin. He was found to have some areas of hypoattenuation of his kidneys bilaterally and an abnormal fluid collection in his pelvis without an ice exclamation. We reviewed all of his films this morning with radiology and they find no evidence for an abscess where the fluid collection is located. Of note his urine culture was negative and his urinalysis was unremarkable making exclamation of pyelonephritis and unlikely one for his areas of hypoattenuation within the kidneys  #1 pneumonia with sirs: I am bothered by his apparent clinical worsening yesterday I agree with broadened antibiotics we will change the ceftaz edema to cefepime for more bactericidal killing of susceptible gram-positive organisms such as pneumococcus and methicillin sensitive staph aureus while continuing the vancomycin and azithromycin we will check a urine Legionella antigen urine for streptococcal antigen and repeat a chest x-ray. We have blood cultures that were done yesterday will repeat them again today. I wonder if he might have another process going on such as bacterial endocarditis given the persistent fevers and given the findings in his kidneys that could represent embolic phenomenon. I think we should consider getting a 2-D echocardiogram under these circumstances at minimum.  #2 diabetes mellitus with diabetic ketoacidosis: His gap is closed and  is being managed with insulin here.  #3 social: Patient has had trouble affording his medications are wonder if he could not be considered for Medicaid.  Rhina Brackett Dam 10/23/2011, 11:48 AM

## 2011-10-23 NOTE — Progress Notes (Signed)
  Echocardiogram 2D Echocardiogram has been performed.  [name] Deneen 10/23/2011, 3:45 PM

## 2011-10-23 NOTE — Progress Notes (Signed)
Subjective: The patient is sitting up eating breakfast this morning and states he did not sleep well last night. He is denying any pain anywhere. He is not feeling nauseous and has not vomited overnight. No diarrhea. No chest pain, SOB. Having some cough, non-productive. States this is fairly infrequent. Denies abdominal pain. States he feels about the same as yesterday. States he was taking his insulin at home as prescribed.   Objective: Vital signs in last 24 hours: Filed Vitals:   10/23/11 0352 10/23/11 0400 10/23/11 0500 10/23/11 0746  BP:  117/74 109/72   Pulse:  86 84   Temp: 100.4 F (38 C)   101.8 F (38.8 C)  TempSrc: Oral     Resp:  19 17   Height:      Weight:      SpO2:  98% 98%    Weight change: 4 lb 6.6 oz (2 kg)  Intake/Output Summary (Last 24 hours) at 10/23/11 0842 Last data filed at 10/23/11 0746  Gross per 24 hour  Intake   1400 ml  Output    425 ml  Net    975 ml   General: resting in bed, eating breakfast HEENT: PERRL, EOMI, no scleral icterus Cardiac: RRR, no rubs, murmurs or gallops Pulm: clear to auscultation bilaterally, moving normal volumes of air Abd: soft, nontender, nondistended, BS present Ext: warm and well perfused, no pedal edema Neuro: alert and oriented X3, cranial nerves II-XII grossly intact  Lab Results: Basic Metabolic Panel:  Lab [id number] 0430 10/22/11 2244 10/22/11 0520  NA 129* 127* --  K 3.6 3.2* --  CL 97 96 --  CO2 21 24 --  GLUCOSE 262* 222* --  BUN 9 8 --  CREATININE 0.66 0.74 --  CALCIUM 7.9* 7.8* --  MG -- -- 1.8  PHOS -- -- --   Liver Function Tests:  Lab 10/21/11 1940  AST 18  ALT 12  ALKPHOS 107  BILITOT 0.3  PROT 8.3  ALBUMIN 3.3*    Lab 10/21/11 1940  LIPASE 11  AMYLASE --   CBC:  Lab 10/23/11 0430 10/22/11 0520 10/21/11 1940  WBC 7.9 12.0* --  NEUTROABS -- -- 9.3*  HGB 12.8* 14.5 --  HCT 37.3* 40.4 --  MCV 79.0 79.4 --  PLT 206 208 --   CBG:  Lab 10/23/11 0757 10/22/11 2205 10/22/11  2025 10/22/11 1632 10/22/11 1132 10/22/11 0721  GLUCAP 299* 216* 65* 174* 157* 221*   Urinalysis:  Lab 10/21/11 1837  COLORURINE YELLOW  LABSPEC 1.043*  PHURINE 5.5  GLUCOSEU >1000*  HGBUR NEGATIVE  BILIRUBINUR NEGATIVE  KETONESUR >80*  PROTEINUR 100*  UROBILINOGEN 1.0  NITRITE NEGATIVE  LEUKOCYTESUR NEGATIVE   Studies/Results: X-ray Chest Pa And Lateral   10/22/2011  *RADIOLOGY REPORT*  Clinical Data: Fever and cough.  CHEST - 2 VIEW  Comparison: 06/15/2008.  Findings: The cardiac silhouette, mediastinal and hilar contours are within normal limits and stable.  There is ill-defined airspace opacity at the left lung base consistent with pneumonia.  The right lung is clear.  IMPRESSION: Left lower lobe infiltrate.  Original Report Authenticated By: P. Kalman Jewels, M.D.   Ct Abdomen Pelvis W Contrast  10/21/2011  *RADIOLOGY REPORT*  Clinical Data: Abdominal pain and fever.  CT ABDOMEN AND PELVIS WITH CONTRAST  Technique:  Multidetector CT imaging of the abdomen and pelvis was performed following the standard protocol during bolus administration of intravenous contrast.  Contrast: 37mL OMNIPAQUE IOHEXOL 300 MG/ML  SOLN  Comparison: No  priors.  Findings:  Lung Bases: There is airspace consolidation with air bronchograms in the left lower lobe, concerning for pneumonia. There is a small hiatal hernia.  Abdomen/Pelvis:  Enhanced appearance of the liver, gallbladder, pancreas, spleen and bilateral adrenal glands is unremarkable. There is subtle areas of decreased enhancement in the posterior aspects of the kidneys bilaterally (image 31 of series 2 on the right and image 25 of series 2 on the left).  There is a trace volume of free fluid in the pelvis which is unusual in a male patient.  No larger volume of ascites.  No pneumoperitoneum.  No pathologic distension of bowel.  No definite pathologic lymphadenopathy identified within the abdomen or pelvis.  Prostate urinary bladder are unremarkable in  appearance.  Musculoskeletal: There are no aggressive appearing lytic or blastic lesions noted in the visualized portions of the skeleton.  IMPRESSION: 1.  The unusual areas of apparent the decreased perfusion in the posterior aspect of the kidneys bilaterally are nonspecific, but favored to represent sequelae of pyelonephritis. Another differential consideration would include small renal infarctions. Correlation with urinalysis is recommended. 2.  Small volume of free fluid in the anatomic pelvis may be reactive to #1 above.  Otherwise, no acute findings in the abdomen or pelvis are noted to account for this free fluid in the pelvis which is highly unusual in a male patient. 3.  Airspace consolidation with air bronchograms in the left lower lobe concerning for left lower lobe pneumonia. 4.  Small hiatal hernia.  Original Report Authenticated By: Etheleen Mayhew, M.D.   Medications: I have reviewed the patient's current medications. Scheduled Meds:    . acetaminophen  650 mg Oral Once  . azithromycin  500 mg Intravenous Q24H  . cefTAZidime (FORTAZ)  IV  1 g Intravenous STAT  . cefTAZidime (FORTAZ)  IV  1 g Intravenous Q8H  . enoxaparin  40 mg Subcutaneous Q24H  . insulin aspart  0-9 Units Subcutaneous TID WC  . insulin aspart protamine-insulin aspart  30 Units Subcutaneous BID WC  . potassium chloride SA  40 mEq Oral Once  . sodium chloride  3 mL Intravenous Q12H  . vancomycin  1,000 mg Intravenous STAT  . vancomycin  1,000 mg Intravenous Q8H  . DISCONTD: azithromycin  500 mg Oral Daily  . DISCONTD: cefTRIAXone (ROCEPHIN)  IV  1 g Intravenous Q24H   Continuous Infusions:    . sodium chloride Stopped (10/22/11 [id number])  . DISCONTD: sodium chloride Stopped (10/22/11 2005)   PRN Meds:.acetaminophen, acetaminophen, ibuprofen, morphine injection, ondansetron (ZOFRAN) IV, ondansetron Assessment/Plan:  Questionable Pyelonephritis - Pt admitted without abdominal pain and without urinary symptoms  and urinalysis was fairly benign. No fevers since admission but will continue to follow closely. Strongly suspect not acute pyelonephritis. Urine culture came back with no growth.   CAP (community acquired pneumonia) - On CXR showed some area and abdominal CT showed pneumonia so are treating as community acquired with ceftazidime, vancomycin, and azithromycin. Fevers continue to spike yesterday and today. Blood cultures drawn yesterday. Moved to step down overnight given concern over fevers and vitals however is stable today and can be moved back to regular bed. Will change ceftaz to cefepime.  Type I diabetes mellitus with ketoacidosis, uncontrolled - Is getting insulin 70/30 twice daily 30 units. Blood sugars are not quite controlled in the setting of acute infection which likely precipitated the episode of DKA. AG is 13 as of this morning and was not on insulin drip during hospital  stay. Continuing to give fluids as he starts eating as he is likely still fluid depleted.   ESSENTIAL HYPERTENSION - Blood pressure controlled by diet at home and has not been high during this stay. Will treat if needed.     Hyponatremia - The patient has hyponatremia due to decreased fluid state and continues to get fluids. Is improving to almost normal.   Disposition - The patient is still having fevers and will be moved to regular floor today from step down. Will change antibiotic to cefepime.     LOS: 2 days   Vertell Novak 10/23/2011, 8:42 AM

## 2011-10-23 NOTE — Progress Notes (Signed)
Patient transferred to 3011. Report called to RN on 3000 and all questions answered. Patient transferred via wheelchair with NT. Family (mom, Linus Orn) notified of new room assignment.

## 2011-10-24 LAB — LEGIONELLA ANTIGEN, URINE: Legionella Antigen, Urine: NEGATIVE

## 2011-10-24 LAB — GLUCOSE, CAPILLARY
Glucose-Capillary: 271 mg/dL — ABNORMAL HIGH (ref 70–99)
Glucose-Capillary: 147 mg/dL — ABNORMAL HIGH (ref 70–99)
Glucose-Capillary: 184 mg/dL — ABNORMAL HIGH (ref 70–99)
Glucose-Capillary: 141 mg/dL — ABNORMAL HIGH (ref 70–99)

## 2011-10-24 MED ORDER — INSULIN ASPART 100 UNIT/ML ~~LOC~~ SOLN: 0.0000 [IU] | Freq: Every day | SUBCUTANEOUS | Status: DC

## 2011-10-24 MED ORDER — INSULIN ASPART 100 UNIT/ML ~~LOC~~ SOLN
0.0000 [IU] | Freq: Three times a day (TID) | SUBCUTANEOUS | Status: DC
  Administered 2011-10-24: 3 [IU] via SUBCUTANEOUS
  Administered 2011-10-24 – 2011-10-25 (×2): 2 [IU] via SUBCUTANEOUS

## 2011-10-24 NOTE — Progress Notes (Addendum)
Subjective: The patient is sitting up eating breakfast this morning and states he is feeling okay this morning. He is asking when he can go home. He is denying any pain anywhere. He is not feeling nauseous and has not vomited overnight. No diarrhea. No chest pain, SOB. Having some cough, non-productive. States this is fairly infrequent. Denies abdominal pain.   Objective: Vital signs in last 24 hours: Filed Vitals:   10/23/11 1201 10/23/11 1725 10/23/11 2200 10/24/11 0600  BP: 120/81 126/76 94/57 107/70  Pulse: 85 76 90 88  Temp: 98.6 F (37 C) 98.1 F (36.7 C) 99.3 F (37.4 C) 100.5 F (38.1 C)  TempSrc: Oral Oral Oral Oral  Resp: 23 20 18 18   Height:      Weight:      SpO2: 98% 98% 95% 94%   Weight change:   Intake/Output Summary (Last 24 hours) at 10/24/11 0953 Last data filed at 10/24/11 [id number]  Gross per 24 hour  Intake 5303.33 ml  Output   2300 ml  Net 3003.33 ml   General: resting in bed, eating breakfast HEENT: PERRL, EOMI, no scleral icterus Cardiac: RRR, no rubs, murmurs or gallops Pulm: clear to auscultation bilaterally, moving normal volumes of air Abd: soft, nontender, nondistended, BS present Ext: warm and well perfused, no pedal edema Neuro: alert and oriented X3, cranial nerves II-XII grossly intact  Lab Results: Basic Metabolic Panel:  Lab [id number] 0430 10/22/11 2244 10/22/11 0520  NA 129* 127* --  K 3.6 3.2* --  CL 97 96 --  CO2 21 24 --  GLUCOSE 262* 222* --  BUN 9 8 --  CREATININE 0.66 0.74 --  CALCIUM 7.9* 7.8* --  MG -- -- 1.8  PHOS -- -- --   Liver Function Tests:  Lab 10/21/11 1940  AST 18  ALT 12  ALKPHOS 107  BILITOT 0.3  PROT 8.3  ALBUMIN 3.3*    Lab 10/21/11 1940  LIPASE 11  AMYLASE --   CBC:  Lab 10/23/11 0430 10/22/11 0520 10/21/11 1940  WBC 7.9 12.0* --  NEUTROABS -- -- 9.3*  HGB 12.8* 14.5 --  HCT 37.3* 40.4 --  MCV 79.0 79.4 --  PLT 206 208 --   CBG:  Lab 10/24/11 0649 10/23/11 2134 10/23/11 1733 10/23/11  1158 10/23/11 0757 10/22/11 2205  GLUCAP 271* 184* 202* 231* 299* 216*   Studies/Results: Dg Chest 2 View  10/23/2011  *RADIOLOGY REPORT*  Clinical Data: Follow up left lower lobe pneumonia.  Persistent fever.  CHEST - 2 VIEW  Comparison: Two-view chest x-ray yesterday, 06/15/2008, 07/26/2005.  Findings: No significant change in the patchy airspace opacities in the left lower lobe since the examination yesterday.  No new pulmonary parenchymal abnormalities.  No pleural effusions. Cardiomediastinal silhouette unremarkable, unchanged.  Visualized bony thorax intact.  IMPRESSION: Stable left lower lobe pneumonia.  No new abnormalities.  Original Report Authenticated By: Deniece Portela, M.D.   Medications: I have reviewed the patient's current medications. Scheduled Meds:    . azithromycin  500 mg Intravenous Q24H  . ceFEPime (MAXIPIME) IV  1 g Intravenous Q8H  . enoxaparin  40 mg Subcutaneous Q24H  . insulin aspart  0-15 Units Subcutaneous TID WC  . insulin aspart  0-5 Units Subcutaneous QHS  . insulin aspart protamine-insulin aspart  30 Units Subcutaneous BID WC  . sodium chloride  3 mL Intravenous Q12H  . vancomycin  1,000 mg Intravenous Q8H  . white petrolatum      . DISCONTD: cefTAZidime (  FORTAZ)  IV  1 g Intravenous Q8H  . DISCONTD: insulin aspart  0-9 Units Subcutaneous TID WC   Continuous Infusions:    . DISCONTD: sodium chloride 1,000 mL (10/24/11 0520)   PRN Meds:.acetaminophen, acetaminophen, ibuprofen, morphine injection, ondansetron (ZOFRAN) IV, ondansetron Assessment/Plan:  CAP (community acquired pneumonia) - On CXR showed some area and abdominal CT showed pneumonia so are treating as community acquired with cefepime, vancomycin, and azithromycin. PT was afebrile most of the day yesterday and is having a small fever this morning. CBC trending down and will follow. Blood cultures drawn 6/4. Doing well today, will watch another day until afebrile. May try to transition to  PO antibiotics tomorrow. Repeat CXR yesterday was the same.   Type I diabetes mellitus with ketoacidosis, uncontrolled - Is getting insulin 70/30 twice daily 30 units. Blood sugars are not quite controlled in the setting of acute infection which likely precipitated the episode of DKA. AG closed, saline lock fluids this morning. Per hospital diabetic coordinator will increase sliding scale to better control sugars.   ESSENTIAL HYPERTENSION - Blood pressure controlled by diet at home and has not been high during this stay. Will treat if needed.     Hyponatremia - Resolved with fluid supplementation. Currently not hyponatremic.   Disposition - The patient had mild fever this morning but mostly afebrile. Continue IV antibiotics today and may transition to PO tomorrow.     LOS: 3 days   Vertell Novak 10/24/2011, 9:53 AM  Internal Medicine Teaching Service Attending Note Date: 10/24/2011  Patient name: ALESANDER YADAV  Medical record number: [id number]  Date of birth: [birth year]    This patient has been seen and discussed with the house staff. Please see their note for complete details. I concur with their findings with the following additions/corrections  :41 year old man with diabetes mellitus, admitted with fevers and diabetic ketoacidosis found to have left lower lobe pneumonia., initially treated with rocephin and azithromycin but then with high fevers to 104 and SIRS, broadened to vancomycin and ceftaz (then cefepime) and azithromycin. He has improved overnight with lower temperature today and downtrending white blood cell count. We also obtained an echocardiogram to the chest wall which failed to show any evidence of endocarditis. Her menopausal by his areas of hypoperfusion on CT scan in his kidney and would like to obtain an MRI of the abdomen to better delineate these possible renal infarcts. IF they are indeed renal infarcts this would certainly make me worry about a a cardiogenic  source and in particular infectious endocarditis (not seen on 2 d echo). We may in that case consider TEE. I would continue him on his current broad-spectrum antibiotics for the time being .   Rhina Brackett Dam 10/24/2011, 12:15 PM

## 2011-10-24 NOTE — Progress Notes (Signed)
Pt refused for labs to be drawn on him this morning. Stated "he had been stuck enough and did not want to be anymore" encouraged patient to have it done but he refused.

## 2011-10-25 ENCOUNTER — Telehealth: Payer: Self-pay | Admitting: Dietician

## 2011-10-25 LAB — GLUCOSE, CAPILLARY
Glucose-Capillary: 147 mg/dL — ABNORMAL HIGH (ref 70–99)
Glucose-Capillary: 115 mg/dL — ABNORMAL HIGH (ref 70–99)

## 2011-10-25 MED ORDER — INSULIN NPH ISOPHANE & REGULAR (70-30) 100 UNIT/ML ~~LOC~~ SUSP: 30.0000 [IU] | Freq: Two times a day (BID) | SUBCUTANEOUS | Status: DC

## 2011-10-25 MED ORDER — LEVOFLOXACIN 750 MG PO TABS: 750.0000 mg | ORAL_TABLET | Freq: Every day | ORAL | Status: AC

## 2011-10-25 MED ORDER — LEVOFLOXACIN 750 MG PO TABS
750.0000 mg | ORAL_TABLET | Freq: Every day | ORAL | Status: DC
  Administered 2011-10-25: 750 mg via ORAL
  Filled 2011-10-25: qty 1

## 2011-10-25 MED ORDER — LEVOFLOXACIN 750 MG PO TABS
750.0000 mg | ORAL_TABLET | Freq: Every day | ORAL | Status: DC
  Filled 2011-10-25: qty 1

## 2011-10-25 NOTE — Progress Notes (Signed)
Pt refused for labs to be drawn this morning, tried to convince him. But he still refuses.

## 2011-10-25 NOTE — Telephone Encounter (Signed)
Contacted patient while he was in room 3011 in hospital for diabetes and to assist with bridge to hospital follow up appointment. He wanted to defer seeing CDE until Monday, 10/28/11 at 1:30 Pm because his ride had to be somewhere.

## 2011-10-25 NOTE — Discharge Summary (Signed)
[facility] Discharge Note  Name: [name] MRN: [id number] DOB: 09/02/1970 41 y.o.  Date of Admission: 10/21/2011  6:03 PM Date of Discharge: 10/25/2011 Attending Physician: Truman Hayward, MD  Discharge Diagnosis: Active Problems:  Type I diabetes mellitus with ketoacidosis, uncontrolled  ESSENTIAL HYPERTENSION  CAP (community acquired pneumonia)  Hyponatremia   Discharge Medications: Medication List  As of 10/25/2011 12:18 PM   TAKE these medications         insulin NPH-insulin regular (70-30) 100 UNIT/ML injection   Commonly known as: NOVOLIN 70/30   Inject 30 Units into the skin 2 (two) times daily with a meal.      levofloxacin 750 MG tablet   Commonly known as: LEVAQUIN   Take 1 tablet (750 mg total) by mouth daily.            Disposition and follow-up:   [name] was discharged from [facility] in Good condition.  At the hospital follow up visit please address any fevers, and also consider ordering MRI to follow up on possible renal infarctions. Please see below for full details. Please check a BMP to ensure that his hyponatremia is resolved.   Follow-up Appointments: Follow-up Information    Follow up with Ivor Costa, MD on 10/29/2011. (At 3:00 PM at the clinic)    Contact information:   [address] [place] [phone]         Discharge Orders    Future Appointments: Provider: Department: Dept Phone: Center:   10/29/2011 3:15 PM Ivor Costa, MD [facility] [phone] [facility]     Future Orders Please Complete By Expires   Diet - low sodium heart healthy      Increase activity slowly      Call MD for:  temperature >100.4         Consultations:  none  Procedures Performed:  Dg Chest 2 View  10/23/2011  *RADIOLOGY REPORT*  Clinical Data: Follow up left lower lobe pneumonia.  Persistent fever.  CHEST - 2 VIEW  Comparison: Two-view chest x-ray  yesterday, 06/15/2008, 07/26/2005.  Findings: No significant change in the patchy airspace opacities in the left lower lobe since the examination yesterday.  No new pulmonary parenchymal abnormalities.  No pleural effusions. Cardiomediastinal silhouette unremarkable, unchanged.  Visualized bony thorax intact.  IMPRESSION: Stable left lower lobe pneumonia.  No new abnormalities.  Original Report Authenticated By: Deniece Portela, M.D.   X-ray Chest Pa And Lateral   10/22/2011  *RADIOLOGY REPORT*  Clinical Data: Fever and cough.  CHEST - 2 VIEW  Comparison: 06/15/2008.  Findings: The cardiac silhouette, mediastinal and hilar contours are within normal limits and stable.  There is ill-defined airspace opacity at the left lung base consistent with pneumonia.  The right lung is clear.  IMPRESSION: Left lower lobe infiltrate.  Original Report Authenticated By: P. Kalman Jewels, M.D.   Ct Abdomen Pelvis W Contrast  10/21/2011  *RADIOLOGY REPORT*  Clinical Data: Abdominal pain and fever.  CT ABDOMEN AND PELVIS WITH CONTRAST  Technique:  Multidetector CT imaging of the abdomen and pelvis was performed following the standard protocol during bolus administration of intravenous contrast.  Contrast: 64mL OMNIPAQUE IOHEXOL 300 MG/ML  SOLN  Comparison: No priors.  Findings:  Lung Bases: There is airspace consolidation with air bronchograms in the left lower lobe, concerning for pneumonia. There is a small hiatal hernia.  Abdomen/Pelvis:  Enhanced appearance of  the liver, gallbladder, pancreas, spleen and bilateral adrenal glands is unremarkable. There is subtle areas of decreased enhancement in the posterior aspects of the kidneys bilaterally (image 31 of series 2 on the right and image 25 of series 2 on the left).  There is a trace volume of free fluid in the pelvis which is unusual in a male patient.  No larger volume of ascites.  No pneumoperitoneum.  No pathologic distension of bowel.  No definite pathologic  lymphadenopathy identified within the abdomen or pelvis.  Prostate urinary bladder are unremarkable in appearance.  Musculoskeletal: There are no aggressive appearing lytic or blastic lesions noted in the visualized portions of the skeleton.  IMPRESSION: 1.  The unusual areas of apparent the decreased perfusion in the posterior aspect of the kidneys bilaterally are nonspecific, but favored to represent sequelae of pyelonephritis. Another differential consideration would include small renal infarctions. Correlation with urinalysis is recommended. 2.  Small volume of free fluid in the anatomic pelvis may be reactive to #1 above.  Otherwise, no acute findings in the abdomen or pelvis are noted to account for this free fluid in the pelvis which is highly unusual in a male patient. 3.  Airspace consolidation with air bronchograms in the left lower lobe concerning for left lower lobe pneumonia. 4.  Small hiatal hernia.  Original Report Authenticated By: Etheleen Mayhew, M.D.    2D Echo:  Left ventricle: The cavity size was normal. Wall thickness was normal. Systolic function was normal. The estimated ejection fraction was in the range of 60% to 65%. Transthoracic echocardiography. M-mode, complete 2D, spectral Doppler, and color Doppler. Height: Height: 170.2cm. Height: 67in. Weight: Weight: 67.6kg. Weight: 148.7lb. Body mass index: BMI: 23.3kg/m^2. Body surface area: BSA: 1.43m^2. Blood pressure: 120/81.   Left ventricle: The cavity size was normal. Wall thickness was normal. Systolic function was normal. The estimated ejection fraction was in the range of 60% to 65%.  Aortic valve: Structurally normal valve. Cusp separation was normal. Doppler: Transvalvular velocity was within the normal range. There was no stenosis. No regurgitation.  Aorta: Aortic root: The aortic root was normal in size. Ascending aorta: The ascending aorta was normal in size.  Mitral valve: Structurally normal valve. Leaflet  separation was normal. Doppler: Transvalvular velocity was within the normal range. There was no evidence for stenosis. No regurgitation.  Left atrium: The atrium was normal in size.  Right ventricle: The cavity size was normal. Wall thickness was normal. Systolic function was normal.  Pulmonic valve: Doppler: No significant regurgitation.  Tricuspid valve: Doppler: Trivial regurgitation.  Pulmonary artery: Systolic pressure was at the upper limits of normal.  Right atrium: The atrium was normal in size.  Pericardium: There was no pericardial effusion.  Systemic veins: Inferior vena cava: The vessel was normal in size; the respirophasic diameter changes were in the normal range (= 50%); findings are consistent with normal central venous pressure.  Admission HPI:  Patient is a 41 yo man with PMH significant for uncontrolled diabetes who presents with acute onset, non-radiating, [id number], periumbilical/suprapubic pain, described as dull/achy. Pain began 3d PTA, and he is unable to identify an inciting incident. Accompanying symptoms include feeling sleepy all day and decreased appetite, though he has been able to drink fluids. He denies N/V/diarrhea/constipation. He reports chills and subjective fever. He denies dysuria/hematuria, but does report increased urinary frequency and urgency. He has never felt similar symptoms in the past. He reports pain improved with IV pain medications given in the ED, but  has not tried any medications at home. Pain is worse with standing, but not clearly related to food. Finally, he reports a nonproductive cough which began on day of admission. He is sexually active with females, but denies recent sexual activity. Denies penile discharge. He was urged to come to the hospital by his mother.   Regarding his DM, he does report compliance with Insulin, 30u BID.  Hospital Course by problem list:  DKA - The patient was admitted with a BS in the 300s with AG and was  treated for DKA which resolved. He was given 70/30 insulin and was not put on insulin drip. He was given fluids and treated appropriately. Likely exacerbation from acute infection. Did use sliding scale insulin while in the hospital to add on to the 70/30 insulin. However, his sugars were under good control during this stay and the DKA did resolve with resolution of the AG. Diabetic educator was contacted at our clinic to help with follow up as out-patient. He was given some insulin on discharge as he often has difficulty affording his medication as an out-patient.    CAP (community acquired pneumonia) - Pt was admitted with fever to 103 and concern for pyelonephritis versus CAP later urine culture did not grow anything and U/A was unremarkable and good evidence on CXR and CT for pneumonia. Treatment for CAP was initiated and patient was given ceftriaxone and azithromycin which was broadened when he was still having fevers. These fevers resolved with broadened coverage. He was switched to PO medication with levofloxacin when his fevers were resolved. The patient did not have overt symptoms of pneumonia but did have occasional non-productive cough. There was still concern for endocarditis so TTE was done in the hospital which was not revealing. Blood cultures were not obtained until the patient had been on antibiotics for several days and so would recommend if patient has persistent fevers after this course repeat blood cultures or TEE may be indicated. He was feeling good and afebrile for 24 hours at the time of discharge. He did receive 5 days of antibiotics during this stay and was discharged home with 2 more days of levofloxacin which was provided for him by the hospital.    Type I diabetes mellitus with ketoacidosis, uncontrolled - Please see DKA above. HgA1C has been very high in the past and patient does state that he has been taking his medicine more regularly lately. Please follow up to see if he is  taking his meds.    ESSENTIAL HYPERTENSION - Not on any medications out-patient and blood pressures were not high during this stay.    Hyponatremia - Patient did have sodium of 125 on admission which was felt to be related to fluid loss from his DKA and was treated with IV fluids and did recover to 129. The patient then was refusing labs the last 2 days of admission so was not re-checked. If possible check at out-patient follow up visit. If recurrent may need further work up for his hyponatremia. Otherwise is resolved at time of discharge.     Discharge Vitals:  BP 105/63  Pulse 80  Temp(Src) 98.3 F (36.8 C) (Oral)  Resp 18  Ht 5\' 7"  (1.702 m)  Wt 149 lb 0.5 oz (67.6 kg)  BMI 23.34 kg/m2  SpO2 92%  Discharge Labs:  Results for orders placed during the hospital encounter of 10/21/11 (from the past 24 hour(s))  GLUCOSE, CAPILLARY     Status: Abnormal   Collection Time  10/24/11  4:33 PM      Component Value Range   Glucose-Capillary 184 (*) 70 - 99 (mg/dL)  GLUCOSE, CAPILLARY     Status: Abnormal   Collection Time   10/24/11  9:13 PM      Component Value Range   Glucose-Capillary 141 (*) 70 - 99 (mg/dL)   Comment 1 Documented in Chart     Comment 2 Notify RN    GLUCOSE, CAPILLARY     Status: Abnormal   Collection Time   10/25/11  6:51 AM      Component Value Range   Glucose-Capillary 147 (*) 70 - 99 (mg/dL)   Comment 1 Documented in Chart     Comment 2 Notify RN    GLUCOSE, CAPILLARY     Status: Abnormal   Collection Time   10/25/11 11:37 AM      Component Value Range   Glucose-Capillary 115 (*) 70 - 99 (mg/dL)    Signed: Vertell Novak 10/25/2011, 12:18 PM   Time Spent on Discharge: 20 minutes

## 2011-10-25 NOTE — Progress Notes (Signed)
Clinical Social Work Department BRIEF PSYCHOSOCIAL ASSESSMENT 10/25/2011  Patient:  [name], [name]     Account Number:  [id number]     Admit date:  10/21/2011  Clinical Social Worker:  Glenice Laine  Date/Time:  10/25/2011 11:00 AM  Referred by:  Physician  Date Referred:  10/25/2011 Referred for  Other - See comment   Other Referral:   DM management/resources   Interview type:  Patient Other interview type:    PSYCHOSOCIAL DATA Living Status:  FAMILY Admitted from facility:   Level of care:   Primary support name:   Primary support relationship to patient:  PARENT Degree of support available:   Adequate per pt report    CURRENT CONCERNS Current Concerns  Financial Resources   Other Concerns:    SOCIAL WORK ASSESSMENT / PLAN CSW met with pt at bedside to discuss DM management and financial resources.  Pt relayed that he lives with his mother who is able to assist with the cost of DM medications-  CSW and pt discussed speaking with DM educator in [place] as well as [facility] to assist with more long term needs.  CSW discussed medication assistance with CM who will provide what is available through [employer] funds.  No further needs at this time-  Signing off   Assessment/plan status:  No Further Intervention Required Other assessment/ plan:   Information/referral to community resources:   Archivist and DM educator    PATIENT'S/FAMILY'S RESPONSE TO PLAN OF CARE: Pt very reserved, quiet, agreeable to referrals and grateful for multi-disciplinary team- CSW signing off- Gerre Scull, [phone]

## 2011-10-25 NOTE — Discharge Instructions (Signed)
Please take your antibiotic tomorrow and Sunday. Continue to take your insulin 30 units twice a day as usual. If you have any more fevers or chills please call our office. Our number is [phone].

## 2011-10-25 NOTE — Progress Notes (Signed)
Upon d/c gave all diabetic education and gave 2 vials of Insulin 70/30 and antibiotics. Case management has set up follow up for financial aid for medical treatment. Missed to gave his prescription for insulin, so going to mail it. Contacted him at home and let them know. No other concerns.

## 2011-10-25 NOTE — Care Management Note (Signed)
    Page 1 of 1   10/25/2011     2:11:59 PM   CARE MANAGEMENT NOTE 10/25/2011  Patient:  [name], [name]   Account Number:  [id number]  Date Initiated:  10/22/2011  Documentation initiated by:  Marvetta Gibbons  Subjective/Objective Assessment:   Pt admitted with PNA, DKA,     Action/Plan:   PTA pt lived at home with family, was independent with ADLs   Anticipated DC Date:  10/25/2011   Anticipated DC Plan:  [place]  CM consult  Medication Assistance      Choice offered to / List presented to:             Status of service:  Completed, signed off Medicare Important Message given?   (If response is "NO", the following Medicare IM given date fields will be blank) Date Medicare IM given:   Date Additional Medicare IM given:    Discharge Disposition:  HOME/SELF CARE  Per UR Regulation:  Reviewed for med. necessity/level of care/duration of stay  If discussed at [place] of Stay Meetings, dates discussed:    Comments:  PCP- Niu  10/25/11 Obtained levaquin and Novolog insulin for patient thru indigent fund. Fuller Plan RN, BSN, CCM  10/22/11- 35- Marvetta Gibbons RN, BSN - [phone] UR completed, NCM to follow for potential d/c needs- per pharmacy pt is eligible for medication assistance through the indigent fund if needed at discharge.

## 2011-10-25 NOTE — Progress Notes (Addendum)
Subjective: The patient is sleeping when I enter. He states that he is feeling okay this morning. He is tired. No pain, and states his cough is fine. He refused labs yesterday and did not take his IV antibiotics last night as well. No fevers since around lunchtime yesterday and he would like to go home today.   Objective: Vital signs in last 24 hours: Filed Vitals:   10/24/11 1400 10/24/11 1545 10/24/11 2200 10/25/11 0600  BP: 105/62  114/74 105/63  Pulse: 98  78 80  Temp: 101.3 F (38.5 C) 99.1 F (37.3 C) 98.7 F (37.1 C) 98.3 F (36.8 C)  TempSrc: Oral  Oral Oral  Resp: 18  18 18   Height:      Weight:      SpO2: 94%  94% 92%   Weight change:   Intake/Output Summary (Last 24 hours) at 10/25/11 0737 Last data filed at 10/24/11 1314  Gross per 24 hour  Intake    480 ml  Output   1400 ml  Net   -920 ml   General: resting in bed, eating breakfast HEENT: PERRL, EOMI, no scleral icterus Cardiac: RRR, no rubs, murmurs or gallops Pulm: clear to auscultation bilaterally, moving normal volumes of air Abd: soft, nontender, nondistended, BS present Ext: warm and well perfused, no pedal edema Neuro: alert and oriented X3, cranial nerves II-XII grossly intact  Lab Results: Basic Metabolic Panel:  Lab [id number] 0430 10/22/11 2244 10/22/11 0520  NA 129* 127* --  K 3.6 3.2* --  CL 97 96 --  CO2 21 24 --  GLUCOSE 262* 222* --  BUN 9 8 --  CREATININE 0.66 0.74 --  CALCIUM 7.9* 7.8* --  MG -- -- 1.8  PHOS -- -- --   Liver Function Tests:  Lab 10/21/11 1940  AST 18  ALT 12  ALKPHOS 107  BILITOT 0.3  PROT 8.3  ALBUMIN 3.3*    Lab 10/21/11 1940  LIPASE 11  AMYLASE --   CBC:  Lab 10/23/11 0430 10/22/11 0520 10/21/11 1940  WBC 7.9 12.0* --  NEUTROABS -- -- 9.3*  HGB 12.8* 14.5 --  HCT 37.3* 40.4 --  MCV 79.0 79.4 --  PLT 206 208 --   CBG:  Lab 10/25/11 0651 10/24/11 2113 10/24/11 1633 10/24/11 1130 10/24/11 0649 10/23/11 2134  GLUCAP 147* 141* 184* 147* 271*  184*   Studies/Results: Dg Chest 2 View  10/23/2011  *RADIOLOGY REPORT*  Clinical Data: Follow up left lower lobe pneumonia.  Persistent fever.  CHEST - 2 VIEW  Comparison: Two-view chest x-ray yesterday, 06/15/2008, 07/26/2005.  Findings: No significant change in the patchy airspace opacities in the left lower lobe since the examination yesterday.  No new pulmonary parenchymal abnormalities.  No pleural effusions. Cardiomediastinal silhouette unremarkable, unchanged.  Visualized bony thorax intact.  IMPRESSION: Stable left lower lobe pneumonia.  No new abnormalities.  Original Report Authenticated By: Deniece Portela, M.D.   Medications: I have reviewed the patient's current medications. Scheduled Meds:    . azithromycin  500 mg Intravenous Q24H  . ceFEPime (MAXIPIME) IV  1 g Intravenous Q8H  . enoxaparin  40 mg Subcutaneous Q24H  . insulin aspart  0-15 Units Subcutaneous TID WC  . insulin aspart  0-5 Units Subcutaneous QHS  . insulin aspart protamine-insulin aspart  30 Units Subcutaneous BID WC  . sodium chloride  3 mL Intravenous Q12H  . vancomycin  1,000 mg Intravenous Q8H  . DISCONTD: insulin aspart  0-9 Units Subcutaneous TID  WC   Continuous Infusions:    . DISCONTD: sodium chloride 1,000 mL (10/24/11 0520)   PRN Meds:.acetaminophen, acetaminophen, ibuprofen, morphine injection, ondansetron (ZOFRAN) IV, ondansetron Assessment/Plan:  CAP (community acquired pneumonia) - Pt did refuse his antibiotics yesterday however received 3 days IV antibiotics and symptoms were alleviating yesterday. Will move to oral antibiotics today with levaquin. Suspect that this is CAP with some viral component as well. If he can tolerate antibiotics and remain afebrile until this afternoon he may be stable for discharge home with close follow up.   Type I diabetes mellitus with ketoacidosis, uncontrolled - Is getting insulin 70/30 twice daily 30 units. Blood sugars  Controlled now. AG closed, saline  lock fluids. Per hospital diabetic coordinator have adjusted sliding scale to better control sugars.   ESSENTIAL HYPERTENSION - Blood pressure controlled by diet at home and has not been high during this stay. Will treat if needed.     Hyponatremia - Resolved with fluid supplementation. Currently not hyponatremic.   Disposition - The patient had mild fever yesterday but mostly afebrile. PO antibiotics today and discharge later in the day if he does well.     LOS: 4 days   Vertell Novak 10/25/2011, 7:37 AM  Internal Medicine Teaching Service Attending Note Date: 10/25/2011  Patient name: [name]  Medical record number: [id number]  Date of birth: 04-21-1971    This patient has been seen and discussed with the house staff. Please see their note for complete details. I concur with their findings with the following additions/corrections:with diabetes mellitus, admitted with fevers and diabetic ketoacidosis found to have left lower lobe pneumonia., initially treated with rocephin and azithromycin but then with high fevers to 104 and SIRS, broadened to vancomycin and ceftaz (then cefepime) and azithromycin. He is now refusing all IV antibiotics but actually looks improved clinically. I am comfortable finishing off a 7 day course of antibiotics with 3 final days being with levaquin 750mg  daily. I would like him to followup with [facility] clinic in next few weeks to ensure he is doing well and without recurrence of fevers. If he DOES have recurrence of fevers would get blood cultures and pursue TEE. I still wonder about his possible renal infarcts being emboli from IE vs other cardiac source. We can have more sensitive MRI done as an outpatient.   Alcide Evener 10/25/2011, 12:13 PM

## 2011-10-25 NOTE — Progress Notes (Signed)
Patient refuses IV Antibiotics, states he thinks its the cause of his fevers. [name] may try to take his medication at a later time.No therapeutic measures worked to change his mind. Non-compliant. Patient resting.

## 2011-10-28 ENCOUNTER — Ambulatory Visit: Payer: 59 | Admitting: Dietician

## 2011-10-28 LAB — CULTURE, BLOOD (ROUTINE X 2)
Culture  Setup Time: 201306042307
Culture  Setup Time: 201306042307
Culture: NO GROWTH
Culture: NO GROWTH

## 2011-10-28 LAB — GLUCOSE, CAPILLARY: Glucose-Capillary: 151 mg/dL — ABNORMAL HIGH (ref 70–99)

## 2011-10-29 ENCOUNTER — Encounter: Payer: Self-pay | Admitting: Internal Medicine

## 2011-12-03 ENCOUNTER — Encounter: Payer: 59 | Admitting: Internal Medicine

## 2011-12-31 ENCOUNTER — Encounter: Payer: Self-pay | Admitting: Internal Medicine

## 2012-03-23 ENCOUNTER — Encounter: Payer: Self-pay | Admitting: Internal Medicine

## 2012-03-23 NOTE — Progress Notes (Signed)
Patient ID: [name], male   DOB: [birth year], 41 y.o.   MRN: [id number]  This patient is a CHRONIC NO-SHOW PATIENT that has a history of HYPERTENSION.  Please make sure to address hypertension during his next clinic appointment, and intervene as appropriate.    Within the AVS, please incorporate the following smartphrase: .htntips   Pertinent Data: BP Readings from Last 3 Encounters:  10/25/11 108/70  05/16/11 100/70  05/02/11 118/73    BMI: Estimated Body mass index is 23.34 kg/(m^2) as calculated from the following:   Height as of 10/21/11: 5\' 7" (1.702 m).   Weight as of 10/21/11: 149 lb 0.5 oz(67.6 kg).  Smoking History: History  Smoking status  . Never Smoker   Smokeless tobacco  . Never Used    Last Basic Metabolic Panel:    Component Value Date/Time   NA 129* 10/23/2011 0430   K 3.6 10/23/2011 0430   CL 97 10/23/2011 0430   CO2 21 10/23/2011 0430   BUN 9 10/23/2011 0430   CREATININE 0.66 10/23/2011 0430   CREATININE 0.77 05/02/2011 1616   GLUCOSE 262* 10/23/2011 0430   CALCIUM 7.9* 10/23/2011 0430    Allergies: No Known Allergies

## 2012-05-28 ENCOUNTER — Encounter: Payer: Self-pay | Admitting: Internal Medicine

## 2012-06-09 ENCOUNTER — Telehealth: Payer: Self-pay | Admitting: Internal Medicine

## 2012-06-09 NOTE — Telephone Encounter (Signed)
Patient was mailed a letter on 05-28-12 to schedule an appt for HM.  Letter was returned due incorrect mailing address.

## 2012-08-31 ENCOUNTER — Emergency Department (HOSPITAL_COMMUNITY): Payer: Self-pay

## 2012-08-31 ENCOUNTER — Encounter (HOSPITAL_COMMUNITY): Payer: Self-pay | Admitting: Emergency Medicine

## 2012-08-31 ENCOUNTER — Emergency Department (HOSPITAL_COMMUNITY)
Admission: EM | Admit: 2012-08-31 | Discharge: 2012-08-31 | Disposition: A | Discharge status: Home / Self Care | Payer: Self-pay | Attending: Emergency Medicine | Admitting: Emergency Medicine

## 2012-08-31 DIAGNOSIS — E119 Type 2 diabetes mellitus without complications: Secondary | ICD-10-CM | POA: Insufficient documentation

## 2012-08-31 DIAGNOSIS — Z862 Personal history of diseases of the blood and blood-forming organs and certain disorders involving the immune mechanism: Secondary | ICD-10-CM | POA: Insufficient documentation

## 2012-08-31 DIAGNOSIS — L02519 Cutaneous abscess of unspecified hand: Secondary | ICD-10-CM | POA: Insufficient documentation

## 2012-08-31 DIAGNOSIS — Z8701 Personal history of pneumonia (recurrent): Secondary | ICD-10-CM | POA: Insufficient documentation

## 2012-08-31 DIAGNOSIS — L0291 Cutaneous abscess, unspecified: Secondary | ICD-10-CM

## 2012-08-31 DIAGNOSIS — Z8639 Personal history of other endocrine, nutritional and metabolic disease: Secondary | ICD-10-CM | POA: Insufficient documentation

## 2012-08-31 DIAGNOSIS — Z794 Long term (current) use of insulin: Secondary | ICD-10-CM | POA: Insufficient documentation

## 2012-08-31 DIAGNOSIS — L03019 Cellulitis of unspecified finger: Secondary | ICD-10-CM | POA: Insufficient documentation

## 2012-08-31 DIAGNOSIS — I10 Essential (primary) hypertension: Secondary | ICD-10-CM | POA: Insufficient documentation

## 2012-08-31 MED ORDER — SULFAMETHOXAZOLE-TRIMETHOPRIM 800-160 MG PO TABS: 1.0000 | ORAL_TABLET | Freq: Two times a day (BID) | ORAL | Status: DC

## 2012-08-31 MED ORDER — CEPHALEXIN 500 MG PO CAPS: 500.0000 mg | ORAL_CAPSULE | Freq: Four times a day (QID) | ORAL | Status: DC

## 2012-08-31 NOTE — ED Notes (Signed)
Patient with right index finger swelling.  Patient states that the swelling started one week ago.  Patient has circular area on finger that showed up yesterday.

## 2012-08-31 NOTE — ED Provider Notes (Signed)
History    This chart was scribed for non-physician practitioner Cleatrice Burke, PA-C working with Blanchie Dessert, MD by Hampton Abbot, ED Scribe. This patient was seen in room TR09C/TR09C and the patient's care was started at 9:42 PM.   CSN: [id number]  Arrival date & time 08/31/12  1943   First MD Initiated Contact with Patient 08/31/12 2124      Chief Complaint  Patient presents with  . Hand Pain    The history is provided by the patient. No language interpreter was used.  [name] is a 42 y.o. male who presents to the Emergency Department complaining of throbbing right index finger pain and swelling constant over the past week and gradually worsening.  No associated traumas or injuries.  Pt reports associated tingling in the finger.  Pain worsened with ROM of finger.  Pt has used Aleve with no improvements.  Pt is left handed. Pt denies fever, nausea, emesis, abdominal pain.     Past Medical History  Diagnosis Date  . Diabetes mellitus type II, uncontrolled 07/17/2006  . ESSENTIAL HYPERTENSION 07/17/2006  . HYPERLIPIDEMIA 11/24/2008  . Pneumonia 10/22/2011    Past Surgical History  Procedure Laterality Date  . Arm surgery      Family History  Problem Relation Age of Onset  . Hypertension Mother 75  . Cancer Mother     bladder cancer  . Alcohol abuse Father   . Hypertension Brother     History  Substance Use Topics  . Smoking status: Never Smoker   . Smokeless tobacco: Never Used  . Alcohol Use: No      Review of Systems  Constitutional: Negative for fever.  Gastrointestinal: Negative for nausea, vomiting and abdominal pain.  Musculoskeletal:       Right index finger pain    Allergies  Review of patient's allergies indicates no known allergies.  Home Medications   Current Outpatient Rx  Name  Route  Sig  Dispense  Refill  . insulin NPH-insulin regular (NOVOLIN 70/30) (70-30) 100 UNIT/ML injection   Subcutaneous   Inject 30 Units into the skin 2  (two) times daily with a meal.   10 mL   0     BP 141/94  Pulse 99  Temp(Src) 97.7 F (36.5 C) (Oral)  Resp 20  SpO2 100%  Physical Exam  Nursing note and vitals reviewed. Constitutional: He is oriented to person, place, and time. He appears well-developed and well-nourished. No distress.  HENT:  Head: Normocephalic and atraumatic.  Right Ear: External ear normal.  Left Ear: External ear normal.  Nose: Nose normal.  Eyes: Conjunctivae are normal.  Neck: Normal range of motion. No tracheal deviation present.  Cardiovascular: Normal rate, regular rhythm and normal heart sounds.   Pulmonary/Chest: Effort normal and breath sounds normal. No stridor.  Abdominal: Soft. He exhibits no distension. There is no tenderness.  Musculoskeletal: Normal range of motion.  Swelling and decreased ROM at right index PIP with 1cm blister  Neurological: He is alert and oriented to person, place, and time.  Skin: Skin is warm and dry. He is not diaphoretic.  Psychiatric: He has a normal mood and affect. His behavior is normal.    ED Course  Procedures (including critical care time) DIAGNOSTIC STUDIES: Oxygen Saturation is 100% on room air, normal by my interpretation.    COORDINATION OF CARE: 9:45 PM- Discussed treatment plan with pt.  He understands and agrees.    Dg Hand Complete Right  08/31/2012  *  RADIOLOGY REPORT*  Clinical Data: Right hand pain, at the proximal interphalangeal joint of the index finger, with swelling  RIGHT HAND - COMPLETE 3+ VIEW  Comparison: None.  Findings: There is no evidence of fracture or dislocation.  There is no radiographic evidence for osteoarthritis.  The joint spaces are preserved; the soft tissues are unremarkable in appearance. The carpal rows are intact, and demonstrate normal alignment.  IMPRESSION: No evidence of fracture or dislocation.  The second proximal interphalangeal joint is grossly unremarkable in appearance.   Original Report Authenticated By:  Santa Lighter, M.D.    INCISION AND DRAINAGE Performed by: Cleatrice Burke Performed by Baxter Flattery, PA-S, supervised by Cleatrice Burke, PA-C Consent: Verbal consent obtained. Risks and benefits: risks, benefits and alternatives were discussed Type: abscess  Body area: right index finger  Anesthesia: local infiltration  Incision was made with a scalpel.  Local anesthetic: lidocaine 2%   Anesthetic total: 2 ml  Complexity: complex Blunt dissection to break up loculations  Drainage: purulent  Drainage amount: 1 mL   Patient tolerance: Patient tolerated the procedure well with no immediate complications.     1. Abscess   2. Diabetes       MDM  Diabetic patient presents with worsening finger pain. XR was negative for fracture or osteomyelitis. Finger was I&D'd without complication. Patient able to flex finger after procedure. Placed on Bactrim to cover for MRSA. Care of finger discussed. Strict return precautions given. Vital signs stable for discharge. Patient / Family / Caregiver informed of clinical course, understand medical decision-making process, and agree with plan.       I personally performed the services described in this documentation, which was scribed in my presence. The recorded information has been reviewed and is accurate.   Elwyn Lade, PA-C 09/01/12 1710

## 2012-09-01 NOTE — ED Provider Notes (Signed)
Medical screening examination/treatment/procedure(s) were conducted as a shared visit with non-physician practitioner(s) and myself.  I personally evaluated the patient during the encounter Pt with finger abscess without signs of tenosynovitis  Blanchie Dessert, MD 09/01/12 2213

## 2012-09-02 ENCOUNTER — Encounter (HOSPITAL_COMMUNITY): Payer: Self-pay | Admitting: *Deleted

## 2012-09-02 ENCOUNTER — Emergency Department (HOSPITAL_COMMUNITY)
Admission: EM | Admit: 2012-09-02 | Discharge: 2012-09-02 | Disposition: A | Discharge status: Home / Self Care | Payer: Self-pay | Attending: Emergency Medicine | Admitting: Emergency Medicine

## 2012-09-02 DIAGNOSIS — Z862 Personal history of diseases of the blood and blood-forming organs and certain disorders involving the immune mechanism: Secondary | ICD-10-CM | POA: Insufficient documentation

## 2012-09-02 DIAGNOSIS — Z8639 Personal history of other endocrine, nutritional and metabolic disease: Secondary | ICD-10-CM | POA: Insufficient documentation

## 2012-09-02 DIAGNOSIS — L03019 Cellulitis of unspecified finger: Secondary | ICD-10-CM | POA: Insufficient documentation

## 2012-09-02 DIAGNOSIS — E119 Type 2 diabetes mellitus without complications: Secondary | ICD-10-CM | POA: Insufficient documentation

## 2012-09-02 DIAGNOSIS — I10 Essential (primary) hypertension: Secondary | ICD-10-CM | POA: Insufficient documentation

## 2012-09-02 DIAGNOSIS — L02519 Cutaneous abscess of unspecified hand: Secondary | ICD-10-CM | POA: Insufficient documentation

## 2012-09-02 DIAGNOSIS — Z794 Long term (current) use of insulin: Secondary | ICD-10-CM | POA: Insufficient documentation

## 2012-09-02 DIAGNOSIS — Z8701 Personal history of pneumonia (recurrent): Secondary | ICD-10-CM | POA: Insufficient documentation

## 2012-09-02 DIAGNOSIS — L0291 Cutaneous abscess, unspecified: Secondary | ICD-10-CM

## 2012-09-02 MED ORDER — OXYCODONE-ACETAMINOPHEN 5-325 MG PO TABS
2.0000 | ORAL_TABLET | Freq: Once | ORAL | Status: AC
  Administered 2012-09-02: 2 via ORAL
  Filled 2012-09-02: qty 2

## 2012-09-02 MED ORDER — CEPHALEXIN 500 MG PO CAPS: 500.0000 mg | ORAL_CAPSULE | Freq: Four times a day (QID) | ORAL | Status: DC

## 2012-09-02 NOTE — ED Notes (Signed)
Pt. Wound soaked, dried, and wrapped. Pt. Verbalized understanding of wound care and follow-up care.

## 2012-09-02 NOTE — ED Provider Notes (Signed)
History    This chart was scribed for non-physician practitioner working with Alfonzo Feller, DO by Ludger Nutting, ED Scribe. This patient was seen in room TR04C/TR04C and the patient's care was started at 1659.   CSN: [id number]  Arrival date & time 09/02/12  1659   None     Chief Complaint  Patient presents with  . Wound Check     The history is provided by the patient. No language interpreter was used.    [name] is a 42 y.o. male who presents to the Emergency Department requesting wound check for an abscess to the R pointer finger today. Pt was seen 2 days ago in the ED for an XRAY and I&D. Pt is here today for a recheck and a work note. Pt denies soaking the area in warm water and only changed the bandage earlier today. He denies fever, chills, nausea, vomiting. Per pt, the wound is better than it was 2 days ago.  Nothing makes it better and nothing makes it worse. Patient states he is taking his antibiotic as directed along with his insulin. He states he's had no problems with his blood sugar in the past several days.  Pt has not followed up with hand surgery.  Past Medical History  Diagnosis Date  . Diabetes mellitus type II, uncontrolled 07/17/2006  . ESSENTIAL HYPERTENSION 07/17/2006  . HYPERLIPIDEMIA 11/24/2008  . Pneumonia 10/22/2011    Past Surgical History  Procedure Laterality Date  . Arm surgery      Family History  Problem Relation Age of Onset  . Hypertension Mother 27  . Cancer Mother     bladder cancer  . Alcohol abuse Father   . Hypertension Brother     History  Substance Use Topics  . Smoking status: Never Smoker   . Smokeless tobacco: Never Used  . Alcohol Use: No      Review of Systems  Constitutional: Negative for fever, diaphoresis, appetite change, fatigue and unexpected weight change.  HENT: Negative for mouth sores and neck stiffness.   Eyes: Negative for visual disturbance.  Respiratory: Negative for cough, chest tightness,  shortness of breath and wheezing.   Cardiovascular: Negative for chest pain.  Gastrointestinal: Negative for nausea, vomiting, abdominal pain, diarrhea and constipation.  Endocrine: Negative for polydipsia, polyphagia and polyuria.  Genitourinary: Negative for dysuria, urgency, frequency and hematuria.  Musculoskeletal: Negative for back pain.  Skin: Positive for wound. Negative for rash.  Allergic/Immunologic: Negative for immunocompromised state.  Neurological: Negative for syncope, light-headedness and headaches.  Hematological: Does not bruise/bleed easily.  Psychiatric/Behavioral: Negative for sleep disturbance. The patient is not nervous/anxious.     Allergies  Review of patient's allergies indicates no known allergies.  Home Medications   Current Outpatient Rx  Name  Route  Sig  Dispense  Refill  . insulin NPH-regular (NOVOLIN 70/30) (70-30) 100 UNIT/ML injection   Subcutaneous   Inject 30 Units into the skin 2 (two) times daily with a meal.         . cephALEXin (KEFLEX) 500 MG capsule   Oral   Take 1 capsule (500 mg total) by mouth 4 (four) times daily.   40 capsule   0     BP 127/79  Pulse 99  Temp(Src) 98 F (36.7 C) (Oral)  Resp 18  SpO2 98%  Physical Exam  Nursing note and vitals reviewed. Constitutional: He is oriented to person, place, and time. He appears well-developed and well-nourished. No distress.  HENT:  Head: Normocephalic and atraumatic.  Nose: Right sinus exhibits no maxillary sinus tenderness and no frontal sinus tenderness. Left sinus exhibits no maxillary sinus tenderness and no frontal sinus tenderness.  Mouth/Throat: Uvula is midline and mucous membranes are normal. No oral lesions. Normal dentition. No edematous, lacerations or dental caries. No posterior oropharyngeal edema, posterior oropharyngeal erythema or tonsillar abscesses.  Eyes: Conjunctivae are normal. Pupils are equal, round, and reactive to light. Right eye exhibits no  discharge. Left eye exhibits no discharge. No scleral icterus.  Neck: Normal range of motion. Neck supple.  Cardiovascular: Normal rate, regular rhythm, normal heart sounds and intact distal pulses.   Capillary refill < 3 sec  Pulmonary/Chest: Effort normal and breath sounds normal. No respiratory distress. He has no wheezes.  Musculoskeletal: He exhibits tenderness.  Lymphadenopathy:    He has no cervical adenopathy.  Neurological: He is alert and oriented to person, place, and time.  Skin: Skin is warm and dry. He is not diaphoretic. There is erythema.  Right first finger has open wound from I&D with a large pocket of pus. Minimal cellulitis. Swollen and erythematous but has ROM.   Psychiatric: He has a normal mood and affect.    ED Course  Procedures (including critical care time)  DIAGNOSTIC STUDIES: Oxygen Saturation is 98% on room air, normal by my interpretation.    COORDINATION OF CARE: 7:25 PM-Discussed treatment plan with pt at bedside and pt agreed to plan.    Labs Reviewed - No data to display Dg Hand Complete Right  08/31/2012  *RADIOLOGY REPORT*  Clinical Data: Right hand pain, at the proximal interphalangeal joint of the index finger, with swelling  RIGHT HAND - COMPLETE 3+ VIEW  Comparison: None.  Findings: There is no evidence of fracture or dislocation.  There is no radiographic evidence for osteoarthritis.  The joint spaces are preserved; the soft tissues are unremarkable in appearance. The carpal rows are intact, and demonstrate normal alignment.  IMPRESSION: No evidence of fracture or dislocation.  The second proximal interphalangeal joint is grossly unremarkable in appearance.   Original Report Authenticated By: Santa Lighter, M.D.      1. Abscess and cellulitis       MDM  Pt presents for wound check and work note.  Pt with abscess to the finger and large amount of puss expressed tonight. Pt afebrile, NAD, nontoxic, nonseptic appearing.  Pt states wound is  better, but has not been soaking the hand or washing it.    Finger soaked in warm soapy water here in the department; bandage changed.  Keflex added to pt regimen and pt is to be referred to a hand specialist for further evaluation.  Neurovascularly intact with intact sensation, capillary refill < 3 sec and ability to flex and extend the finger. Patient counseled on reasons to return immediately to the emergency department.  I recommended to the patient that if hand surgery is unable to see him back Friday he is to return to the emergency department on Friday for further wound check. Patient states understanding.   I personally performed the services described in this documentation, which was scribed in my presence. The recorded information has been reviewed and is accurate.   Jarrett Soho Sakara Lehtinen, PA-C 09/03/12 0015

## 2012-09-02 NOTE — ED Notes (Signed)
Pt tx here for abscess to R pointer finger.  Here today for re-check and work note.  Continued drainage and swelling noted.

## 2012-09-03 ENCOUNTER — Telehealth (HOSPITAL_COMMUNITY): Payer: Self-pay | Admitting: Emergency Medicine

## 2012-09-03 LAB — WOUND CULTURE

## 2012-09-03 NOTE — ED Notes (Signed)
Pt returned call.  Pt informed of Dx, need for addl tx and provided MRSA education.Marland Kitchen Rx called to [facility].

## 2012-09-03 NOTE — ED Provider Notes (Signed)
Medical screening examination/treatment/procedure(s) were performed by non-physician practitioner and as supervising physician I was immediately available for consultation/collaboration.   Alfonzo Feller, DO 09/03/12 1526

## 2012-09-03 NOTE — ED Notes (Signed)
Chart reviewed by Charlann Lange PA "Change to Septra DS, 1 po BID x 10 d # 20."  LVM requesting callback from pt.

## 2012-09-03 NOTE — ED Notes (Signed)
Results called from [facility].  Finger -> (+) moderate MRSA.  Rx given in ED for Keflex -> no sensitivity for this drug provived.  Chart to MD for review.

## 2012-09-07 ENCOUNTER — Emergency Department (HOSPITAL_COMMUNITY): Payer: Self-pay

## 2012-09-07 ENCOUNTER — Emergency Department (HOSPITAL_COMMUNITY)
Admission: EM | Admit: 2012-09-07 | Discharge: 2012-09-07 | Disposition: A | Discharge status: Home / Self Care | Payer: Self-pay | Attending: Emergency Medicine | Admitting: Emergency Medicine

## 2012-09-07 DIAGNOSIS — L02519 Cutaneous abscess of unspecified hand: Secondary | ICD-10-CM | POA: Insufficient documentation

## 2012-09-07 DIAGNOSIS — R509 Fever, unspecified: Secondary | ICD-10-CM | POA: Insufficient documentation

## 2012-09-07 DIAGNOSIS — L03019 Cellulitis of unspecified finger: Secondary | ICD-10-CM | POA: Insufficient documentation

## 2012-09-07 DIAGNOSIS — E1159 Type 2 diabetes mellitus with other circulatory complications: Secondary | ICD-10-CM | POA: Insufficient documentation

## 2012-09-07 DIAGNOSIS — Z9119 Patient's noncompliance with other medical treatment and regimen: Secondary | ICD-10-CM | POA: Insufficient documentation

## 2012-09-07 DIAGNOSIS — Z91199 Patient's noncompliance with other medical treatment and regimen due to unspecified reason: Secondary | ICD-10-CM | POA: Insufficient documentation

## 2012-09-07 DIAGNOSIS — M2550 Pain in unspecified joint: Secondary | ICD-10-CM | POA: Insufficient documentation

## 2012-09-07 DIAGNOSIS — J189 Pneumonia, unspecified organism: Secondary | ICD-10-CM | POA: Insufficient documentation

## 2012-09-07 DIAGNOSIS — E785 Hyperlipidemia, unspecified: Secondary | ICD-10-CM | POA: Insufficient documentation

## 2012-09-07 DIAGNOSIS — Z794 Long term (current) use of insulin: Secondary | ICD-10-CM | POA: Insufficient documentation

## 2012-09-07 DIAGNOSIS — L0291 Cutaneous abscess, unspecified: Secondary | ICD-10-CM

## 2012-09-07 DIAGNOSIS — I10 Essential (primary) hypertension: Secondary | ICD-10-CM | POA: Insufficient documentation

## 2012-09-07 LAB — GLUCOSE, CAPILLARY
Glucose-Capillary: 386 mg/dL — ABNORMAL HIGH (ref 70–99)
Glucose-Capillary: 253 mg/dL — ABNORMAL HIGH (ref 70–99)

## 2012-09-07 MED ORDER — IBUPROFEN 800 MG PO TABS
800.0000 mg | ORAL_TABLET | Freq: Once | ORAL | Status: AC
  Administered 2012-09-07: 800 mg via ORAL
  Filled 2012-09-07: qty 1

## 2012-09-07 MED ORDER — ACETAMINOPHEN 325 MG PO TABS
650.0000 mg | ORAL_TABLET | Freq: Once | ORAL | Status: AC
  Administered 2012-09-07: 650 mg via ORAL
  Filled 2012-09-07: qty 2

## 2012-09-07 MED ORDER — INSULIN ASPART PROT & ASPART (70-30 MIX) 100 UNIT/ML ~~LOC~~ SUSP: 30.0000 [IU] | Freq: Two times a day (BID) | SUBCUTANEOUS | Status: DC

## 2012-09-07 MED ORDER — INSULIN ASPART PROT & ASPART (70-30 MIX) 100 UNIT/ML ~~LOC~~ SUSP
30.0000 [IU] | Freq: Once | SUBCUTANEOUS | Status: AC
  Administered 2012-09-07: 30 [IU] via SUBCUTANEOUS
  Filled 2012-09-07: qty 10

## 2012-09-07 NOTE — ED Notes (Signed)
Per report from [facility] pt is complaining of R index finger pain in which he is currently taking treatment for MRSA.  He is reportedly noncompliant with his diabetic treatment.

## 2012-09-07 NOTE — ED Provider Notes (Signed)
History     CSN: [id number]  Arrival date & time 09/07/12  1120   First MD Initiated Contact with Patient 09/07/12 1127      Chief Complaint  Patient presents with  . Hand Pain    (Consider location/radiation/quality/duration/timing/severity/associated sxs/prior treatment) HPI Comments: Patient presented to the ED for right index finger pain.  Patient had an abscess drained approximately one week ago. Was started on Bactrim for coverage of MRSA.  Wound check on 4/16 with wound noted to be healing well without minimal localized infection.  Patient states he has been soaking his hand as directed and changing bandages once daily but he has not followed up with hand surgeon.  Notes increased pain and is now unable to fully flex his finger.  Denies any numbness or paresthesias to right fingers or hand. No recent fevers, sweats, or chills.  Patient is a known diabetic but is not compliant with his insulin.  States he has been trying to take the abx as directed but thinks he may have missed a few doses.  The history is provided by the patient.    Past Medical History  Diagnosis Date  . Diabetes mellitus type II, uncontrolled 07/17/2006  . ESSENTIAL HYPERTENSION 07/17/2006  . HYPERLIPIDEMIA 11/24/2008  . Pneumonia 10/22/2011    Past Surgical History  Procedure Laterality Date  . Arm surgery      Family History  Problem Relation Age of Onset  . Hypertension Mother 52  . Cancer Mother     bladder cancer  . Alcohol abuse Father   . Hypertension Brother     History  Substance Use Topics  . Smoking status: Never Smoker   . Smokeless tobacco: Never Used  . Alcohol Use: No      Review of Systems  Musculoskeletal: Positive for arthralgias.  Skin: Positive for wound.  All other systems reviewed and are negative.    Allergies  Review of patient's allergies indicates no known allergies.  Home Medications   Current Outpatient Rx  Name  Route  Sig  Dispense  Refill  .  cephALEXin (KEFLEX) 500 MG capsule   Oral   Take 1 capsule (500 mg total) by mouth 4 (four) times daily.   40 capsule   0   . insulin NPH-regular (NOVOLIN 70/30) (70-30) 100 UNIT/ML injection   Subcutaneous   Inject 30 Units into the skin 2 (two) times daily with a meal.           BP 113/71  Pulse 91  Temp(Src) 99.5 F (37.5 C) (Oral)  Resp 14  SpO2 98%  Physical Exam  Nursing note and vitals reviewed. Constitutional: He is oriented to person, place, and time. He appears well-developed and well-nourished.  HENT:  Head: Normocephalic and atraumatic.  Mouth/Throat: Oropharynx is clear and moist.  Eyes: Conjunctivae and EOM are normal. Pupils are equal, round, and reactive to light.  Neck: Normal range of motion.  Cardiovascular: Normal rate, regular rhythm and normal heart sounds.   Pulmonary/Chest: Effort normal and breath sounds normal.  Abdominal: Soft. Bowel sounds are normal.  Musculoskeletal:       Hands: Neurological: He is alert and oriented to person, place, and time.  Skin: Skin is warm and dry.  Psychiatric: He has a normal mood and affect.    ED Course  Procedures (including critical care time)  Labs Reviewed  GLUCOSE, CAPILLARY - Abnormal; Notable for the following:    Glucose-Capillary 386 (*)    All other components  within normal limits   Dg Finger Index Right  09/07/2012  *RADIOLOGY REPORT*  Clinical Data: Laceration and infection  RIGHT INDEX FINGER 2+V  Comparison: 08/31/2012  Findings: No evidence of fracture, dislocation or radiopaque foreign object.  No radiographic evidence of osteomyelitis or infected joint.  IMPRESSION: Negative radiographs   Original Report Authenticated By: Nelson Chimes, M.D.      1. Abscess       MDM   X-ray again without evidence of osteomyelitis. CBG 386- Pt noncompliant with his insulin and has not taken it today.  Wound care performed in the ED and dose of insulin given.  Pt febrile at 103- controlled with motrin  and tylenol.  Pt has no insulin and cannot afford it- attempted to contact social services several times but was unsuccessful.  FU with hand surgeon, Dr. Burney Gauze, as soon as possible.  Continue with home wound care and dressing changes.  Rx given for insulin and instructed he must be compliant in order to promote proper wound healing.  Pt acknowledged understanding and agrees with plan.  Return precautions advised.       Larene Pickett, PA-C 09/08/12 1649

## 2012-09-07 NOTE — ED Notes (Signed)
Request made to secretary to re-page social work.  No response to 1st call.

## 2012-09-07 NOTE — ED Notes (Signed)
Pt resting on bed with eyes closed.  Introduced self to friend/family at bedside.  Plan of care discussed.

## 2012-09-07 NOTE — ED Notes (Signed)
Dressing applied to R index finger.

## 2012-09-07 NOTE — ED Notes (Signed)
Attempted to discharge pt.  Pt stated that he could not be discharged until his brother was [place] so he could be told what was going on as well.  ED PA made aware

## 2012-09-08 ENCOUNTER — Ambulatory Visit (INDEPENDENT_AMBULATORY_CARE_PROVIDER_SITE_OTHER): Payer: Self-pay | Admitting: Internal Medicine

## 2012-09-08 ENCOUNTER — Telehealth: Payer: Self-pay | Admitting: *Deleted

## 2012-09-08 ENCOUNTER — Encounter: Payer: Self-pay | Admitting: Internal Medicine

## 2012-09-08 ENCOUNTER — Other Ambulatory Visit: Payer: Self-pay | Admitting: Internal Medicine

## 2012-09-08 ENCOUNTER — Inpatient Hospital Stay (HOSPITAL_COMMUNITY)
Admission: AD | Admit: 2012-09-08 | Discharge: 2012-09-11 | DRG: 607 | Disposition: A | Discharge status: Home / Self Care | Payer: MEDICAID | Source: Ambulatory Visit | Attending: Internal Medicine | Admitting: Internal Medicine

## 2012-09-08 VITALS — BP 93/56 | HR 94 | Temp 102.6°F | Ht 67.5 in | Wt 145.6 lb

## 2012-09-08 DIAGNOSIS — E1142 Type 2 diabetes mellitus with diabetic polyneuropathy: Secondary | ICD-10-CM | POA: Diagnosis present

## 2012-09-08 DIAGNOSIS — L02519 Cutaneous abscess of unspecified hand: Secondary | ICD-10-CM | POA: Diagnosis present

## 2012-09-08 DIAGNOSIS — D709 Neutropenia, unspecified: Secondary | ICD-10-CM | POA: Diagnosis present

## 2012-09-08 DIAGNOSIS — T370X5A Adverse effect of sulfonamides, initial encounter: Secondary | ICD-10-CM | POA: Diagnosis present

## 2012-09-08 DIAGNOSIS — L03019 Cellulitis of unspecified finger: Secondary | ICD-10-CM | POA: Diagnosis present

## 2012-09-08 DIAGNOSIS — R7401 Elevation of levels of liver transaminase levels: Secondary | ICD-10-CM

## 2012-09-08 DIAGNOSIS — R748 Abnormal levels of other serum enzymes: Secondary | ICD-10-CM | POA: Diagnosis present

## 2012-09-08 DIAGNOSIS — IMO0001 Reserved for inherently not codable concepts without codable children: Secondary | ICD-10-CM

## 2012-09-08 DIAGNOSIS — T361X5A Adverse effect of cephalosporins and other beta-lactam antibiotics, initial encounter: Secondary | ICD-10-CM | POA: Diagnosis present

## 2012-09-08 DIAGNOSIS — L270 Generalized skin eruption due to drugs and medicaments taken internally: Principal | ICD-10-CM | POA: Diagnosis present

## 2012-09-08 DIAGNOSIS — E1010 Type 1 diabetes mellitus with ketoacidosis without coma: Secondary | ICD-10-CM

## 2012-09-08 DIAGNOSIS — L519 Erythema multiforme, unspecified: Secondary | ICD-10-CM | POA: Diagnosis present

## 2012-09-08 DIAGNOSIS — R7989 Other specified abnormal findings of blood chemistry: Secondary | ICD-10-CM | POA: Diagnosis present

## 2012-09-08 DIAGNOSIS — R0902 Hypoxemia: Secondary | ICD-10-CM | POA: Diagnosis present

## 2012-09-08 DIAGNOSIS — L089 Local infection of the skin and subcutaneous tissue, unspecified: Secondary | ICD-10-CM

## 2012-09-08 DIAGNOSIS — A4902 Methicillin resistant Staphylococcus aureus infection, unspecified site: Secondary | ICD-10-CM | POA: Diagnosis present

## 2012-09-08 DIAGNOSIS — E871 Hypo-osmolality and hyponatremia: Secondary | ICD-10-CM

## 2012-09-08 DIAGNOSIS — T50905D Adverse effect of unspecified drugs, medicaments and biological substances, subsequent encounter: Secondary | ICD-10-CM

## 2012-09-08 DIAGNOSIS — T7840XA Allergy, unspecified, initial encounter: Secondary | ICD-10-CM | POA: Diagnosis present

## 2012-09-08 DIAGNOSIS — E785 Hyperlipidemia, unspecified: Secondary | ICD-10-CM | POA: Diagnosis present

## 2012-09-08 DIAGNOSIS — S27309A Unspecified injury of lung, unspecified, initial encounter: Secondary | ICD-10-CM

## 2012-09-08 DIAGNOSIS — I10 Essential (primary) hypertension: Secondary | ICD-10-CM | POA: Diagnosis present

## 2012-09-08 DIAGNOSIS — L492 Exfoliation due to erythematous condition involving 20-29 percent of body surface: Secondary | ICD-10-CM | POA: Diagnosis present

## 2012-09-08 DIAGNOSIS — Z9119 Patient's noncompliance with other medical treatment and regimen: Secondary | ICD-10-CM

## 2012-09-08 DIAGNOSIS — E1165 Type 2 diabetes mellitus with hyperglycemia: Secondary | ICD-10-CM | POA: Diagnosis present

## 2012-09-08 DIAGNOSIS — Z91199 Patient's noncompliance with other medical treatment and regimen due to unspecified reason: Secondary | ICD-10-CM

## 2012-09-08 LAB — CBC
HCT: 40.4 % (ref 39.0–52.0)
Hemoglobin: 14.5 g/dL (ref 13.0–17.0)
MCH: 27.6 pg (ref 26.0–34.0)
MCHC: 35.9 g/dL (ref 30.0–36.0)
MCV: 77 fL — ABNORMAL LOW (ref 78.0–100.0)
Platelets: 188 10*3/uL (ref 150–400)
RBC: 5.25 MIL/uL (ref 4.22–5.81)
RDW: 12 % (ref 11.5–15.5)
WBC: 3.3 10*3/uL — ABNORMAL LOW (ref 4.0–10.5)

## 2012-09-08 LAB — COMPREHENSIVE METABOLIC PANEL
ALT: 62 U/L — ABNORMAL HIGH (ref 0–53)
AST: 119 U/L — ABNORMAL HIGH (ref 0–37)
Albumin: 3 g/dL — ABNORMAL LOW (ref 3.5–5.2)
Alkaline Phosphatase: 91 U/L (ref 39–117)
BUN: 19 mg/dL (ref 6–23)
CO2: 24 mEq/L (ref 19–32)
Calcium: 8.1 mg/dL — ABNORMAL LOW (ref 8.4–10.5)
Chloride: 86 mEq/L — ABNORMAL LOW (ref 96–112)
Creat: 1.14 mg/dL (ref 0.50–1.35)
Glucose, Bld: 425 mg/dL — ABNORMAL HIGH (ref 70–99)
Potassium: 4.2 mEq/L (ref 3.5–5.3)
Sodium: 122 mEq/L — ABNORMAL LOW (ref 135–145)
Total Bilirubin: 0.4 mg/dL (ref 0.3–1.2)
Total Protein: 6.6 g/dL (ref 6.0–8.3)

## 2012-09-08 LAB — LACTIC ACID, PLASMA: Lactic Acid, Venous: 1.9 mmol/L (ref 0.5–2.2)

## 2012-09-08 LAB — SEDIMENTATION RATE: Sed Rate: 8 mm/hr (ref 0–16)

## 2012-09-08 LAB — GLUCOSE, CAPILLARY
Glucose-Capillary: 436 mg/dL — ABNORMAL HIGH (ref 70–99)
Glucose-Capillary: 404 mg/dL — ABNORMAL HIGH (ref 70–99)

## 2012-09-08 LAB — CBC WITH DIFFERENTIAL/PLATELET
Basophils Absolute: 0.1 10*3/uL (ref 0.0–0.1)
Basophils Relative: 2 % — ABNORMAL HIGH (ref 0–1)
Eosinophils Absolute: 0 10*3/uL (ref 0.0–0.7)
Eosinophils Relative: 1 % (ref 0–5)
HCT: 41.6 % (ref 39.0–52.0)
Hemoglobin: 15.4 g/dL (ref 13.0–17.0)
Lymphocytes Relative: 49 % — ABNORMAL HIGH (ref 12–46)
Lymphs Abs: 1.4 10*3/uL (ref 0.7–4.0)
MCH: 28 pg (ref 26.0–34.0)
MCHC: 37 g/dL — ABNORMAL HIGH (ref 30.0–36.0)
MCV: 75.6 fL — ABNORMAL LOW (ref 78.0–100.0)
Monocytes Absolute: 0.1 10*3/uL (ref 0.1–1.0)
Monocytes Relative: 4 % (ref 3–12)
Neutro Abs: 1.2 10*3/uL — ABNORMAL LOW (ref 1.7–7.7)
Neutrophils Relative %: 44 % (ref 43–77)
Platelets: 196 10*3/uL (ref 150–400)
RBC: 5.5 MIL/uL (ref 4.22–5.81)
RDW: 12.2 % (ref 11.5–15.5)
WBC: 2.8 10*3/uL — ABNORMAL LOW (ref 4.0–10.5)

## 2012-09-08 LAB — PROTIME-INR
INR: 1.05 (ref 0.00–1.49)
Prothrombin Time: 13.6 seconds (ref 11.6–15.2)

## 2012-09-08 LAB — POCT GLYCOSYLATED HEMOGLOBIN (HGB A1C): Hemoglobin A1C: >14

## 2012-09-08 LAB — APTT: aPTT: 36 seconds (ref 24–37)

## 2012-09-08 MED ORDER — CLINDAMYCIN PHOSPHATE 600 MG/50ML IV SOLN
600.0000 mg | Freq: Three times a day (TID) | INTRAVENOUS | Status: DC
  Administered 2012-09-08 – 2012-09-09 (×3): 600 mg via INTRAVENOUS
  Filled 2012-09-08 (×6): qty 50

## 2012-09-08 MED ORDER — ONDANSETRON HCL 4 MG PO TABS: 4.0000 mg | ORAL_TABLET | Freq: Four times a day (QID) | ORAL | Status: DC | PRN

## 2012-09-08 MED ORDER — INSULIN NPH (HUMAN) (ISOPHANE) 100 UNIT/ML ~~LOC~~ SUSP: 20.0000 [IU] | Freq: Two times a day (BID) | SUBCUTANEOUS | Status: DC

## 2012-09-08 MED ORDER — ONDANSETRON HCL 4 MG/2ML IJ SOLN: 4.0000 mg | Freq: Four times a day (QID) | INTRAMUSCULAR | Status: DC | PRN

## 2012-09-08 MED ORDER — ENOXAPARIN SODIUM 40 MG/0.4ML ~~LOC~~ SOLN
40.0000 mg | SUBCUTANEOUS | Status: DC
  Administered 2012-09-09 – 2012-09-10 (×2): 40 mg via SUBCUTANEOUS
  Filled 2012-09-08 (×4): qty 0.4

## 2012-09-08 MED ORDER — INSULIN NPH (HUMAN) (ISOPHANE) 100 UNIT/ML ~~LOC~~ SUSP
20.0000 [IU] | Freq: Every day | SUBCUTANEOUS | Status: DC
  Administered 2012-09-08 – 2012-09-09 (×2): 20 [IU] via SUBCUTANEOUS
  Filled 2012-09-08: qty 10

## 2012-09-08 MED ORDER — SODIUM CHLORIDE 0.9 % IV SOLN
INTRAVENOUS | Status: DC
  Administered 2012-09-08 – 2012-09-10 (×6): via INTRAVENOUS

## 2012-09-08 MED ORDER — IBUPROFEN 400 MG PO TABS
400.0000 mg | ORAL_TABLET | ORAL | Status: DC | PRN
  Administered 2012-09-09 – 2012-09-10 (×2): 400 mg via ORAL
  Filled 2012-09-08 (×2): qty 1

## 2012-09-08 MED ORDER — DIPHENHYDRAMINE HCL 25 MG PO CAPS
25.0000 mg | ORAL_CAPSULE | Freq: Two times a day (BID) | ORAL | Status: DC
  Administered 2012-09-09 (×2): 25 mg via ORAL
  Filled 2012-09-08 (×4): qty 1

## 2012-09-08 MED ORDER — INSULIN NPH (HUMAN) (ISOPHANE) 100 UNIT/ML ~~LOC~~ SUSP
20.0000 [IU] | Freq: Every day | SUBCUTANEOUS | Status: DC
  Administered 2012-09-09 – 2012-09-11 (×3): 20 [IU] via SUBCUTANEOUS
  Filled 2012-09-08 (×2): qty 10

## 2012-09-08 MED ORDER — INSULIN ASPART 100 UNIT/ML ~~LOC~~ SOLN
0.0000 [IU] | Freq: Three times a day (TID) | SUBCUTANEOUS | Status: DC
  Administered 2012-09-09: 3 [IU] via SUBCUTANEOUS

## 2012-09-08 MED ORDER — OXYCODONE HCL 5 MG PO TABS: 5.0000 mg | ORAL_TABLET | ORAL | Status: DC | PRN

## 2012-09-08 MED ORDER — DIPHENHYDRAMINE HCL 50 MG PO CAPS
50.0000 mg | ORAL_CAPSULE | Freq: Once | ORAL | Status: AC
  Administered 2012-09-08: 50 mg via ORAL
  Filled 2012-09-08: qty 1

## 2012-09-08 MED ORDER — SODIUM CHLORIDE 0.9 % IV BOLUS (SEPSIS)
1000.0000 mL | Freq: Once | INTRAVENOUS | Status: AC
  Administered 2012-09-08: 1000 mL via INTRAVENOUS

## 2012-09-08 MED ORDER — VANCOMYCIN HCL IN DEXTROSE 1-5 GM/200ML-% IV SOLN
1000.0000 mg | Freq: Two times a day (BID) | INTRAVENOUS | Status: DC
  Administered 2012-09-08 – 2012-09-09 (×3): 1000 mg via INTRAVENOUS
  Filled 2012-09-08 (×4): qty 200

## 2012-09-08 NOTE — Progress Notes (Signed)
ANTIBIOTIC CONSULT NOTE - INITIAL  Pharmacy Consult for Vancomycin Indication: Cellulitis of finger  No Known Allergies  Patient Measurements: Height: 5' 7.5" (171.5 cm) Weight: 145 lb 9.6 oz (66.044 kg) IBW/kg (Calculated) : 67.25   Vital Signs: Temp: 102.9 F (39.4 C) (04/22 1800) Temp src: Oral (04/22 1453) BP: 109/58 mmHg (04/22 1800) Pulse Rate: 85 (04/22 1800)    Recent Labs  09/08/12 1447  WBC 3.3*  HGB 14.5  PLT 188  CREATININE 1.14   Estimated Creatinine Clearance: 81.2 ml/min (by C-G formula based on Cr of 1.14).   Microbiology: Recent Results (from the past 720 hour(s))  WOUND CULTURE     Status: None   Collection Time    08/31/12 10:35 PM      Result Value Range Status   Specimen Description WOUND RIGHT FINGER   Final   Special Requests NONE   Final   Gram Stain     Final   Value: RARE WBC PRESENT,BOTH PMN AND MONONUCLEAR     RARE SQUAMOUS EPITHELIAL CELLS PRESENT     NO ORGANISMS SEEN   Culture     Final   Value: MODERATE METHICILLIN RESISTANT STAPHYLOCOCCUS AUREUS     Note: CRITICAL RESULT CALLED TO, READ BACK BY AND VERIFIED WITH: PATTY BY INGRAM A 11/17 10AM   Report Status 09/03/2012 FINAL   Final   Organism ID, Bacteria METHICILLIN RESISTANT STAPHYLOCOCCUS AUREUS   Final    Medical History: Past Medical History  Diagnosis Date  . Diabetes mellitus type II, uncontrolled 07/17/2006  . ESSENTIAL HYPERTENSION 07/17/2006  . HYPERLIPIDEMIA 11/24/2008  . Pneumonia 10/22/2011   Assessment: 42yo male being started on Vancomycin for cellulitis of finger wound. Recently started outpatient on cephalexin with addition of bactrim for MRSA coverage yesterday. Presented again to ED with worsening pain and redness.  WBC 3.3, Scr 1.14 (CrCl ~81 ml/min), Tmax 102.9'F. Wound culture from 08/31/12 (+)MRSA. Blood cultures sent. Biopsy specimen sent.  Antibiotics Vancomycin 4/22 >>  Cultures Blood (x2) 4/22 >> Sent Wound 4/14 >> MRSA   Goal of Therapy:   Vancomycin trough level 10-15 mcg/ml  Plan:  1) Vancomycin 1000mg  IV Q12 hours 2) F/u renal function, clinical status, culture data, LOT, trough at steady state as appropriate.   Woodroe Chen, PharmD, BCPS 09/08/2012   7:23 PM

## 2012-09-08 NOTE — Procedures (Signed)
Resident Cosign of Medical Student Note  I agree with the excellent note by Orpha Bur, MS4, as above.  I was present and supervised the entire procedure, which was a punch biopsy of the right back.  In addition the punch biopsy performed by Dr. Nila Nephew, I also performed a punch biopsy of the left back as below.  After informed written consent was obtained, using Betadine for cleansing and 1% Lidocaine without epinephrine for anesthetic, with sterile technique a 4 mm punch biopsy was used to obtain a biopsy specimen of the lesion. Hemostasis was obtained by pressure and wound was sutured with 4-0 ethylon suture (1 stitch). Clean dressing is applied, and wound care instructions provided. Be alert for any signs of cutaneous infection. The specimen is labeled and sent to pathology for evaluation. The procedure was well tolerated without complications.  Signed, Elnora Morrison, PGY2 09/08/2012,8:41 PM

## 2012-09-08 NOTE — Progress Notes (Signed)
Report called to Adrienne,RN on 5N. Room is not ready;needs a bed per RN.

## 2012-09-08 NOTE — Progress Notes (Signed)
  Subjective:    Patient ID: [name], male    DOB: 1970-10-03, 42 y.o.   MRN: [id number]  HPI Mr. Tippery presents to clinic for acute visit after evaluation in the ED x2 over the past week for finger wound.  Has hx significant for uncontrolled Diabetes Type 2, hypertension and chronic hyponatremia. Pt was recently started on Kelfex with the addition of Bactrim for MRSA coverage yesterday. Pt reports increased pain and redness now up to his right shoulder with decreased range of motion secondary to pain.  He was febrile in ED yesterday at 103.2 and continues to be febrile today at 102.6.  States that he took a dose of "antibiotic" last night but none today including no insulin today as well secondary to poor appetite.  Of note, he reports not checking his cbgs at home bc he does not have test strips. Denies illicit drug use.   Review of Systems  Constitutional: Positive for fever and fatigue. Negative for appetite change.  Gastrointestinal: Negative for nausea, vomiting and diarrhea.  Musculoskeletal:       Pain and sore of right index finger  Skin: Positive for color change and wound.  Neurological: Positive for weakness.       Objective:   Physical Exam  Constitutional: He is oriented to person, place, and time. He appears well-developed and well-nourished. He appears distressed.  Pt lying on exam table with covers over his head.  HENT:  Head: Normocephalic and atraumatic.  Mouth/Throat: Oropharynx is clear and moist.  Eyes: Conjunctivae and EOM are normal. Pupils are equal, round, and reactive to light.  Cardiovascular: Normal heart sounds.  Tachycardia present.   Abdominal: Soft. Normal appearance.  Musculoskeletal:       Right hand: He exhibits decreased range of motion and tenderness.       Hands: Tenderness up to right shoulder  Neurological: He is oriented to person, place, and time.  Initially sleeping but easily arousible  Skin: There is erythema.     Redness to  face and left arm          Assessment & Plan:  1. Hand infection: will likely need IV antibiotics for MRSA coverage, imaging +/- Hand Specialist consultation if no improvement -no leukocytosis  2. DM Type 2: uncontrolled, will need to resume Novolin 70/30 if pt tolerating oral intake, no anion gap -no signs of DKA -Diabetic educator  3. Hypertension: hypotensive on exam likely secondary to acute illness and decreased oral intake  4. Elevated liver enzymes: pt will require further assessment, no known liver disease, including hepatitis or cirrhosis  5. Hyponatremia: chronic, etiology unclear, corrects to 130 in setting of hyperglycemia  -will require further work-up during admission  5. Disposition: will admit for IV fluids and volume resuscitation

## 2012-09-08 NOTE — H&P (Signed)
Hospital Admission Note Date: 09/08/2012  Patient name: [name] Medical record number: [id number] Date of birth: 10-11-1970 Age: 42 y.o. Gender: male PCP: No PCP Per Patient  Medical Service: [facility]  Attending physician:  Dr. Murlean Caller    1st Contact: Dr. Sandy Salaam   Pager: [phone] 2nd Contact: Dr. Owens Shark   [id number] After 5 pm or weekends: 1st Contact: Intern on call   Pager: [phone] 2nd Contact:  Resident on call  Pager: [phone]  Chief Complaint: Fever, malaise R index finger pain  History of Present Illness: [name] is a 42 yo male w pmh of HTN, HLD, poorly controlled T2DM who presents as a direct admit from the clinic with complaints of worsening right finger pain and drainage from abscess. [name] first presented to the emergency room on April 14 with complaints of throbbing right index pain. He had no antecedent trauma or skin breakdown.  At that time an incision and drainage of the finger was performed without complication. X-ray was negative for any evidence of osteomyelitis. He was discharged with Bactrim. He followed up 2 days later for wound check. At that time, the finger was noted to have minimal cellulitis and cephalexin was added to his antibiotic regimen. He returned to the emergency room again on the April 21 (yesterday) reporting persistent finger pain and some missed antibiotic doses.  X-ray was repeated again and showed no evidence of osteomyelitis. He was noted to be febrile to 103. Reported that he could not afford insulin and noted to also be hyperglycemic with blood sugar 386. He was instructed to continue soaks at home as well as Bactrim and cephalexin. He was given a referral to a Copy. Wound cultures grew out MRSA sensitive to bactrim.  Today, the patient's mother called the internal medicine clinic because she was concerned about his health. She reports that he's been feeling sick at home and noncompliant with insulin or other medications. This  morning his temperature at home was 102.  He reports continued pain of his right index finger and has difficulty bending his PIP joint due to pain. He has full range of motion of his wrist and other finger joints without pain. He says he's also felt more tired at home. He had some nausea and one episode of nonbloody, nonbilious emesis last night.  He has not personally noted any rashes, pruritis or skin changes. He denies any abdominal pain, diarrhea, dizziness, headaches, visual changes, chest pain, shortness of breath, cough, dysuria, hematuria, blood in his stool, eye pain, genital pain.  Meds:      cephALEXin 500 MG capsule  Commonly known as:  KEFLEX  Take 1 capsule (500 mg total) by mouth 4 (four) times daily.     insulin aspart protamine- aspart (70-30) 100 UNIT/ML injection  Commonly known as:  NOVOLOG MIX 70/30  Inject 30 Units into the skin 2 (two) times daily with a meal.     sulfamethoxazole-trimethoprim 800-160 MG per tablet  Commonly known as:  BACTRIM DS  Take 1 tablet by mouth 2 (two) times daily. Take for 10 days. 09/03/12        Allergies: Allergies as of 09/08/2012  . (No Known Allergies)   Past Medical History  Diagnosis Date  . Diabetes mellitus type II, uncontrolled 07/17/2006  . ESSENTIAL HYPERTENSION 07/17/2006  . HYPERLIPIDEMIA 11/24/2008  . Pneumonia 10/22/2011   Past Surgical History  Procedure Laterality Date  . Arm surgery     Family History  Problem Relation Age  of Onset  . Hypertension Mother 102  . Cancer Mother     bladder cancer  . Alcohol abuse Father   . Hypertension Brother    History   Social History  . Marital Status: Married    Spouse Name: N/A    Number of Children: N/A  . Years of Education: N/A   Occupational History  . 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 Smoking status: Never Smoker   . Smokeless tobacco: Never Used  . Alcohol Use: No  . Drug Use: No  . Sexually  Active: Yes -- Male partner(s)     Comment: Not sexually active since April 2013   Other Topics Concern  . Not on file   Social History Narrative   Lives with wife in [place].   Wife recently had foot amputation; is a type 1 DM.   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    Review of Systems: 10 pt ROS performed, pertinent positives and negatives noted in HPI  Physical Exam: Blood pressure 109/58, pulse 85, temperature 102.9 F (39.4 C), resp. rate 18, height 5' 7.5" (1.715 m), weight 145 lb 9.6 oz (66.044 kg), SpO2 95.00%. General : Well-developed, well-nourished male lying on exam table with blanket over his head and body, reluctant participate in history or examination. HEENT: Dry, cracked lips. Conjunctiva normal, not injected. CV: Regular rate and rhythm, no murmurs rubs or gallops Pulmonary: No increased work of breathing, good air movement bilateral lung fields, no wheezes Abdomen: Soft, non-tender, nondistended with normoactive bowel sounds Extremities:  Incision site from prior I&D overlying palmar surface of R index finger in between PIP and DIP joints without frank pus or significant surrounding erythema. Can flex PIP joint 90 degrees with assistance, exam limited secondary to pain. No noted joint effusions or erythema of PIP and DIP joints. Full ROM of remainder of hand and fingers without streaking or cellulitic changes.  Feet are warm and well perfused without rash or edema 2+ radial, DP pulses Skin:Erythroderma of face, neck, shoulders, upper anterior trunk and 2/3 back with mild exfoliative scaling over back (20-30% total BSA). Scaling of lips. No involvement of mucous membranes of eyes or mouth. No involvement of arms or legs, palms or soles.  Neuro: full sensation of R index finger.   Lab results: Basic Metabolic Panel:  Recent Labs  09/08/12 1447  NA 122*  K 4.2  CL 86*  CO2 24  GLUCOSE 425*  BUN 19  CREATININE 1.14  CALCIUM 8.1*   Liver Function Tests:  Recent  Labs  09/08/12 1447  AST 119*  ALT 62*  ALKPHOS 91  BILITOT 0.4  PROT 6.6  ALBUMIN 3.0*   CBC:  Recent Labs  09/08/12 1447  WBC 3.3*  HGB 14.5  HCT 40.4  MCV 77.0*  PLT 188   CBG:  Recent Labs  09/07/12 1148 09/07/12 1432 09/08/12 1445  GLUCAP 386* 253* 436*   Hemoglobin A1C:  Recent Labs  09/08/12 1454  HGBA1C >14.0   Urine Drug Screen: Drugs of Abuse     Component Value Date/Time   LABOPIA NONE DETECTED 06/15/2008 2332   COCAINSCRNUR NONE DETECTED 06/15/2008 2332   LABBENZ NONE DETECTED 06/15/2008 2332   AMPHETMU NONE DETECTED 06/15/2008 2332   THCU NONE DETECTED 06/15/2008 2332   LABBARB  Value: NONE DETECTED        DRUG SCREEN  FOR MEDICAL PURPOSES ONLY.  IF CONFIRMATION IS NEEDED FOR ANY PURPOSE, NOTIFY LAB WITHIN 5 DAYS.        LOWEST DETECTABLE LIMITS FOR URINE DRUG SCREEN Drug Class       Cutoff (ng/mL) Amphetamine      1000 Barbiturate      200 Benzodiazepine   [id number] Tricyclics       [id number] Opiates          300 Cocaine          300 THC              50 06/15/2008 2332    Imaging results:  Dg Finger Index Right  09/07/2012  *RADIOLOGY REPORT*  Clinical Data: Laceration and infection  RIGHT INDEX FINGER 2+V  Comparison: 08/31/2012  Findings: No evidence of fracture, dislocation or radiopaque foreign object.  No radiographic evidence of osteomyelitis or infected joint.  IMPRESSION: Negative radiographs   Original Report Authenticated By: Nelson Chimes, M.D.     Assessment & Plan by Problem: 1) Fever, hypotension, skin rash, leukopenia, elevated LFTs Patient comes in with fever to 103, hypotension, erythroderma with some mild exfoliative skin changes, and elevation of his liver function tests. He has had malaise and myalgias. This is in the setting of recent treatment of right index finger abscess. He received both cephalexin and Bactrim about a week ago. This is all suspicious to me for a drug reaction. Although his fever and hypotension could also be explained for  bacteremia from his right finger abscess, the abscess does appear to be healing well and he does not have a leukocytosis. The clinical features of his presentation are consistent with drug rash and he has systemic symptoms. This could represent DRESS syndrome, and he has recent drug exposure with a common offender (bactrim), although ALT elevation is not as robust as expected. Differential of CBC is pending to look for eosinophilia. Early/prodromal SJS also a possibility, and distribution of rash (face sparing scalp and trunk), elevated LFTs and mild leukopenia may all be consistent with early manifestations of syndrome. In setting of MRSA wound infection and home gauze use, staph toxin-mediated injury might also be possible, although would expect him to be much more ill-appearing and hemodynamically unstable than he currently is. - Skin biopsy 30mm punch x2 performed, sent for H&E and direct immunofluorescence (see separate procedure note) - Vanc for wound infection, clinda for coverage of possible toxin-mediated injury - CBC w differential, blood cultures, UA, PT/INR, lactic acid - Supportive care w IVF bolus and maintenance - Benadryl - No steroids at this time as course appears mild and concern for right finger abscess is possible source of fever and hypotension  2) Poorly healing MRSA abscess of R index finger  Patient with pain and slow healing abscess of R finger, s/p incision and drainage 0000000 with uncertain compliance with outpatient abx therapy, noted to be febrile and with increasing malaise. Wound culture from ED showed MRSA. The wound is exquisitely tender to touch but intact neurovascularly and with diminishing erythema and no clear fluctuance. He did have full ROM with assistance.  Have a lower suspicion that worsening abscess or bacteremia related to abscess is responsible for his current clinical picture. He has no leukocytosis or tracking erythema from the wound. -Will cover with  vancomycin tonight as it is unclear whether or not he has had compliance with outpatient antibiotics. Clinda as above to cover toxin-mediated injury - Will not repeat imaging at this time as x-ray  done just yesterday shows no evidence of osteomyelitis. -Consider consult to orthopedic surgery in the morning - ESR  3) Hyponatremia This is a chronic problem for the patient. His sodium is 122 on admission, which corrects to 130 for his hyperglycemia. - Follow am Bmet - IV fluids with normal saline 100 cc per hour  4)   Type II diabetes mellitus, uncontrolled He has not been taking his NPH insulin as an outpatient. His last hbA1c was greater than 14, and his blood sugar here is 436. Anion gap is 12. -NPH 20 units twice a day and sliding scale insulin    Dispo: Disposition is deferred at this time, awaiting improvement of current medical problems. Anticipated discharge in approximately 3 day(s).   The patient does have a current PCP ([facility]), therefore will be requiring [facility] follow-up after discharge.   The patient does not have transportation limitations that hinder transportation to clinic appointments.  Signed: Tonia Brooms 09/08/2012, 7:29 PM

## 2012-09-08 NOTE — Telephone Encounter (Signed)
Mother called 4/21 stating pt had been working for [employer], became sick- MRSA and came home, left all his meds, diab. Supplies, meds. She stated he was very sick and noncompliant with care. She was advised to take him to ED and she did, she states today that his blood sugar was 487 then, he was sent home but continues to be noncompliant, temp this am 102 orally. He needs glucose monitor, supplies, all meds and eval, she feels he may be worse. appt is given for 1445 dr schooler

## 2012-09-08 NOTE — ED Provider Notes (Signed)
Medical screening examination/treatment/procedure(s) were performed by non-physician practitioner and as supervising physician I was immediately available for consultation/collaboration.    Mervin Kung, MD 09/08/12 [phone]

## 2012-09-08 NOTE — Procedures (Signed)
After informed written consent was obtained, using Betadine for cleansing and 1% Lidocaine without epinephrine for anesthetic, with sterile technique a 4 mm punch biopsy was used to obtain a biopsy specimen of the lesion on the right back wall in the mid-clavicular line 5cm inferior to the scapula. Hemostasis was obtained by pressure and wound was sutured using 4-0 ethylon with a single interrupted stitch. Clean dressing was applied afterwards. Be alert for any signs of cutaneous infection. The specimen is labeled and sent to pathology for evaluation. The procedure was well tolerated without complications.

## 2012-09-08 NOTE — Telephone Encounter (Signed)
Agree.  Thank you, Kyng Matlock

## 2012-09-09 DIAGNOSIS — L089 Local infection of the skin and subcutaneous tissue, unspecified: Secondary | ICD-10-CM

## 2012-09-09 DIAGNOSIS — L539 Erythematous condition, unspecified: Secondary | ICD-10-CM

## 2012-09-09 DIAGNOSIS — L02519 Cutaneous abscess of unspecified hand: Secondary | ICD-10-CM

## 2012-09-09 DIAGNOSIS — D72819 Decreased white blood cell count, unspecified: Secondary | ICD-10-CM

## 2012-09-09 DIAGNOSIS — L03019 Cellulitis of unspecified finger: Secondary | ICD-10-CM

## 2012-09-09 DIAGNOSIS — R509 Fever, unspecified: Secondary | ICD-10-CM

## 2012-09-09 LAB — BLOOD GAS, ARTERIAL
Acid-base deficit: 3.4 mmol/L — ABNORMAL HIGH (ref 0.0–2.0)
Bicarbonate: 20.8 mEq/L (ref 20.0–24.0)
Drawn by: 277331
FIO2: 0.21 %
O2 Saturation: 94.7 %
Patient temperature: 98.6
TCO2: 22 mmol/L (ref 0–100)
pCO2 arterial: 36 mmHg (ref 35.0–45.0)
pH, Arterial: 7.381 (ref 7.350–7.450)
pO2, Arterial: 75.3 mmHg — ABNORMAL LOW (ref 80.0–100.0)

## 2012-09-09 LAB — CBC WITH DIFFERENTIAL/PLATELET
Basophils Absolute: 0 10*3/uL (ref 0.0–0.1)
Basophils Relative: 1 % (ref 0–1)
Eosinophils Absolute: 0.1 10*3/uL (ref 0.0–0.7)
Eosinophils Relative: 3 % (ref 0–5)
HCT: 34.8 % — ABNORMAL LOW (ref 39.0–52.0)
Hemoglobin: 12.5 g/dL — ABNORMAL LOW (ref 13.0–17.0)
Lymphocytes Relative: 55 % — ABNORMAL HIGH (ref 12–46)
Lymphs Abs: 1.5 10*3/uL (ref 0.7–4.0)
MCH: 27.4 pg (ref 26.0–34.0)
MCHC: 35.9 g/dL (ref 30.0–36.0)
MCV: 76.3 fL — ABNORMAL LOW (ref 78.0–100.0)
Monocytes Absolute: 0.2 10*3/uL (ref 0.1–1.0)
Monocytes Relative: 7 % (ref 3–12)
Neutro Abs: 1 10*3/uL — ABNORMAL LOW (ref 1.7–7.7)
Neutrophils Relative %: 34 % — ABNORMAL LOW (ref 43–77)
Platelets: 178 10*3/uL (ref 150–400)
RBC: 4.56 MIL/uL (ref 4.22–5.81)
RDW: 12.3 % (ref 11.5–15.5)
WBC: 2.8 10*3/uL — ABNORMAL LOW (ref 4.0–10.5)

## 2012-09-09 LAB — URINALYSIS, ROUTINE W REFLEX MICROSCOPIC
Bilirubin Urine: NEGATIVE
Glucose, UA: >1000 mg/dL — AB
Hgb urine dipstick: NEGATIVE
Ketones, ur: 15 mg/dL — AB
Leukocytes, UA: NEGATIVE
Nitrite: NEGATIVE
Protein, ur: NEGATIVE mg/dL
Specific Gravity, Urine: 1.034 — ABNORMAL HIGH (ref 1.005–1.030)
Urobilinogen, UA: 1 mg/dL (ref 0.0–1.0)
pH: 6 (ref 5.0–8.0)

## 2012-09-09 LAB — CK: Total CK: 126 U/L (ref 7–232)

## 2012-09-09 LAB — COMPREHENSIVE METABOLIC PANEL
ALT: 72 U/L — ABNORMAL HIGH (ref 0–53)
AST: 126 U/L — ABNORMAL HIGH (ref 0–37)
Albumin: 2.4 g/dL — ABNORMAL LOW (ref 3.5–5.2)
Alkaline Phosphatase: 72 U/L (ref 39–117)
BUN: 13 mg/dL (ref 6–23)
CO2: 23 mEq/L (ref 19–32)
Calcium: 7.1 mg/dL — ABNORMAL LOW (ref 8.4–10.5)
Chloride: 99 mEq/L (ref 96–112)
Creatinine, Ser: 0.77 mg/dL (ref 0.50–1.35)
GFR calc Af Amer: >90 mL/min (ref >90)
GFR calc non Af Amer: >90 mL/min (ref >90)
Glucose, Bld: 197 mg/dL — ABNORMAL HIGH (ref 70–99)
Potassium: 4.1 mEq/L (ref 3.5–5.1)
Sodium: 131 mEq/L — ABNORMAL LOW (ref 135–145)
Total Bilirubin: 0.2 mg/dL — ABNORMAL LOW (ref 0.3–1.2)
Total Protein: 6 g/dL (ref 6.0–8.3)

## 2012-09-09 LAB — LACTIC ACID, PLASMA: Lactic Acid, Venous: 2.2 mmol/L (ref 0.5–2.2)

## 2012-09-09 LAB — GLUCOSE, CAPILLARY
Glucose-Capillary: 163 mg/dL — ABNORMAL HIGH (ref 70–99)
Glucose-Capillary: 189 mg/dL — ABNORMAL HIGH (ref 70–99)
Glucose-Capillary: 73 mg/dL (ref 70–99)
Glucose-Capillary: 154 mg/dL — ABNORMAL HIGH (ref 70–99)

## 2012-09-09 LAB — URINE MICROSCOPIC-ADD ON

## 2012-09-09 LAB — MRSA PCR SCREENING: MRSA by PCR: NEGATIVE

## 2012-09-09 LAB — GAMMA GT: GGT: 22 U/L (ref 7–51)

## 2012-09-09 MED ORDER — SODIUM CHLORIDE 0.9 % IV BOLUS (SEPSIS): 1000.0000 mL | Freq: Once | INTRAVENOUS | Status: AC

## 2012-09-09 MED ORDER — SODIUM CHLORIDE 0.9 % IV BOLUS (SEPSIS)
1000.0000 mL | Freq: Once | INTRAVENOUS | Status: AC
  Administered 2012-09-09: 1000 mL via INTRAVENOUS

## 2012-09-09 MED ORDER — WHITE PETROLATUM GEL
Status: AC
  Administered 2012-09-09: 17:00:00
  Filled 2012-09-09: qty 5

## 2012-09-09 MED ORDER — INSULIN ASPART 100 UNIT/ML ~~LOC~~ SOLN
8.0000 [IU] | Freq: Once | SUBCUTANEOUS | Status: AC
  Administered 2012-09-09: 8 [IU] via SUBCUTANEOUS

## 2012-09-09 MED ORDER — LINEZOLID 600 MG PO TABS
600.0000 mg | ORAL_TABLET | Freq: Two times a day (BID) | ORAL | Status: DC
  Administered 2012-09-09 – 2012-09-11 (×4): 600 mg via ORAL
  Filled 2012-09-09 (×8): qty 1

## 2012-09-09 NOTE — ED Provider Notes (Signed)
Medical screening examination/treatment/procedure(s) were performed by non-physician practitioner and as supervising physician I was immediately available for consultation/collaboration.   Mervin Kung, MD 09/09/12 [phone]

## 2012-09-09 NOTE — Progress Notes (Signed)
Subjective: He is alert and oriented but not very interactive with [place] and does not want to participate in PE or answer questions. After leaving room, called by RN saying pt refused all lab draws.  Soft BPs overnight 80-90/40-50s. Receiving 5th L of NS right now.  Objective: Vital signs in last 24 hours: Filed Vitals:   09/08/12 2112 09/09/12 0201 09/09/12 0522 09/09/12 1015  BP: 95/61 84/46 86/46  89/58  Pulse: 102 95 72 86  Temp: 102.4 F (39.1 C) 100.2 F (37.9 C) 98.2 F (36.8 C) 99.1 F (37.3 C)  TempSrc:    Oral  Resp: 16 16 16 18   Height:      Weight:      SpO2: 96% 94% 97% 97%   Weight change:   Intake/Output Summary (Last 24 hours) at 09/09/12 1149 Last data filed at 09/08/12 2200  Gross per 24 hour  Intake      0 ml  Output    400 ml  Net   -400 ml  Vitals reviewed General : Well-developed, well-nourished male lying in bed, reluctant participate in history or examination.  HEENT: Dry, cracked lips. Conjunctiva normal, not injected. No erythema/ulceration of oral mucosa.  CV: Regular rate and rhythm, no murmurs rubs or gallops  Pulmonary: No increased work of breathing, good air movement bilateral lung fields, no wheezes  Abdomen: Soft, non-tender, nondistended with normoactive bowel sounds  Extremities: Incision site from prior I&D overlying palmar surface of R index finger in between PIP and DIP joints without frank pus or significant surrounding erythema. Can flex PIP joint 90 degrees with assistance, exam limited secondary to pain. No noted joint effusions or erythema of PIP and DIP joints. Full ROM of remainder of hand and fingers without streaking or cellulitic changes.  Feet are warm and well perfused without rash or edema  2+ radial, DP pulses  Skin: Some interval improvement in erythroderma of face, neck, shoulders, upper anterior trunk and 2/3 back with mild exfoliative scaling over back (20-30% total BSA). Persistent scaling of lips. No involvement of mucous  membranes of eyes or mouth. No involvement of arms or legs, palms or soles. Skin biopsy sites on back show sutures in place with no evidence of infection or bleeding. Neuro: Alert and oriented to person, place, time, situation. Full sensation of R index finger.    Lab Results: Basic Metabolic Panel:  Recent Labs Lab 09/08/12 1447  NA 122*  K 4.2  CL 86*  CO2 24  GLUCOSE 425*  BUN 19  CREATININE 1.14  CALCIUM 8.1*   Liver Function Tests:  Recent Labs Lab 09/08/12 1447  AST 119*  ALT 62*  ALKPHOS 91  BILITOT 0.4  PROT 6.6  ALBUMIN 3.0*   CBC:  Recent Labs Lab 09/08/12 1447 09/08/12 2010  WBC 3.3* 2.8*  NEUTROABS  --  1.2*  HGB 14.5 15.4  HCT 40.4 41.6  MCV 77.0* 75.6*  PLT 188 196   CBG:  Recent Labs Lab 09/07/12 1148 09/07/12 1432 09/08/12 1445 09/08/12 2252 09/09/12 0644  GLUCAP 386* 253* 436* 404* 163*   Hemoglobin A1C:  Recent Labs Lab 09/08/12 1454  HGBA1C >14.0   Coagulation:  Recent Labs Lab 09/08/12 2010  LABPROT 13.6  INR 1.05   Urine Drug Screen: Drugs of Abuse     Component Value Date/Time   LABOPIA NONE DETECTED 06/15/2008 2332   COCAINSCRNUR NONE DETECTED 06/15/2008 2332   LABBENZ NONE DETECTED 06/15/2008 2332   AMPHETMU NONE DETECTED 06/15/2008 2332  THCU NONE DETECTED 06/15/2008 2332   LABBARB  Value: NONE DETECTED        DRUG SCREEN FOR MEDICAL PURPOSES ONLY.  IF CONFIRMATION IS NEEDED FOR ANY PURPOSE, NOTIFY LAB WITHIN 5 DAYS.        LOWEST DETECTABLE LIMITS FOR URINE DRUG SCREEN Drug Class       Cutoff (ng/mL) Amphetamine      1000 Barbiturate      200 Benzodiazepine   [id number] Tricyclics       [id number] Opiates          300 Cocaine          300 THC              50 06/15/2008 2332    Urinalysis:  Recent Labs Lab 09/09/12 0037  COLORURINE YELLOW  LABSPEC 1.034*  PHURINE 6.0  GLUCOSEU >1000*  HGBUR NEGATIVE  BILIRUBINUR NEGATIVE  KETONESUR 15*  PROTEINUR NEGATIVE  UROBILINOGEN 1.0  NITRITE NEGATIVE  LEUKOCYTESUR NEGATIVE    Micro Results: Recent Results (from the past 240 hour(s))  WOUND CULTURE     Status: None   Collection Time    08/31/12 10:35 PM      Result Value Range Status   Specimen Description WOUND RIGHT FINGER   Final   Special Requests NONE   Final   Gram Stain     Final   Value: RARE WBC PRESENT,BOTH PMN AND MONONUCLEAR     RARE SQUAMOUS EPITHELIAL CELLS PRESENT     NO ORGANISMS SEEN   Culture     Final   Value: MODERATE METHICILLIN RESISTANT STAPHYLOCOCCUS AUREUS     Note: CRITICAL RESULT CALLED TO, READ BACK BY AND VERIFIED WITH: PATTY BY INGRAM A 11/17 10AM   Report Status 09/03/2012 FINAL   Final   Organism ID, Bacteria METHICILLIN RESISTANT STAPHYLOCOCCUS AUREUS   Final   Studies/Results: Dg Finger Index Right  09/07/2012  *RADIOLOGY REPORT*  Clinical Data: Laceration and infection  RIGHT INDEX FINGER 2+V  Comparison: 08/31/2012  Findings: No evidence of fracture, dislocation or radiopaque foreign object.  No radiographic evidence of osteomyelitis or infected joint.  IMPRESSION: Negative radiographs   Original Report Authenticated By: Nelson Chimes, M.D.    Medications: I have reviewed the patient's current medications. Scheduled Meds: . clindamycin (CLEOCIN) IV  600 mg Intravenous Q8H  . diphenhydrAMINE  25 mg Oral BID  . enoxaparin (LOVENOX) injection  40 mg Subcutaneous Q24H  . insulin aspart  0-15 Units Subcutaneous TID WC  . insulin NPH  20 Units Subcutaneous QAC breakfast  . insulin NPH  20 Units Subcutaneous QHS  . vancomycin  1,000 mg Intravenous Q12H   Continuous Infusions: . sodium chloride 150 mL/hr at 09/09/12 0810   PRN Meds:.ibuprofen, ondansetron (ZOFRAN) IV, ondansetron, oxyCODONE Assessment/Plan:  1) Fever, hypotension, skin rash, leukopenia, elevated LFTs  Patient comes in with fever to 103, hypotension, erythroderma with some mild exfoliative skin changes, and elevation of his liver function tests. He has had malaise and myalgias. This is in the setting of  recent treatment of right index finger abscess. He received both cephalexin and Bactrim about a week ago.  This is suspicious for drug reaction vs toxin-mediated injury. Could be early SJS - consistent findings include erythrodermic rash of face/trunk sparing scalp, lymphopenia, flu-like prodrome, elevated livery enzymes. Could also be DRESS - but would expect ALT>>AST as well as eosinophilia which is not present. Staph toxin-mediated shock could also cause a similar rash .  Unfortunately, patient  refused all labs this am. Martin Majestic back to the room and spoke with him at length about the severity of his condition, the indication for labs and the concern that his BP and UOP are not responding as expected to fluid boluses. He agreed to allow [place] to collect lab work this afternoon.  He is not transferred to the [facility] unit and has peripheral access x2 (20G and 18G) May need critical care consult if BPs do not respond to additional fluid boluses  - Skin biopsy 57mm punch x2 performed yesterday, sent for H&E and direct immunofluorescence, read pending tomorrow (Dr. Andria Frames, [place] [facility] [phone] Case # [id number]). - Vanc for wound infection, clinda for possible toxin-mediated injury (day 2) - No lab results from this am due to pt refusal, will follow-up afternoon labs as patient permits (lactic acid, CK, ABG, Cmet, CBC) - Supportive care w IVF bolus and maintenance  - No steroids at this time as concern for right finger abscess is possible source of fever and hypotension   2) Poorly healing MRSA abscess of R index finger  Patient with pain and slow healing abscess of R finger, s/p incision and drainage 0000000 with uncertain compliance with outpatient abx therapy, noted to be febrile and with increasing malaise. Wound culture from ED showed MRSA. The wound is exquisitely tender to touch but intact neurovascularly and with diminishing erythema and no clear fluctuance. He did have full ROM with assistance.   Low suspicion for osteo, xray 4/21 with no osteo changes and ESR 8.  Patient was supposed to follow up with hand surgery referral as outpatient. Will speak to hand surgeon on call about hospital evaluation.  3) Hyponatremia  This is a chronic problem for the patient. His sodium was 122 on admission, which corrected to 130 for his hyperglycemia. Refusing subsequent lab draws.  - IV fluid boluses, maintenace with normal saline 150 cc per hour   4) Type II diabetes mellitus, uncontrolled  He has not been taking his NPH insulin as an outpatient. His last hbA1c was greater than 14, and his blood sugar here on admission was 436. Anion gap was 12. Glucose this am 163.  -NPH 20 units twice a day and sliding scale insulin   Dispo: Disposition is deferred at this time, awaiting improvement of current medical problems.  Anticipated discharge in approximately 3 day(s).   The patient does have a current PCP ([facility]), therefore will be requiring [facility] follow-up after discharge.   The patient does not have transportation limitations that hinder transportation to clinic appointments.  .Services Needed at time of discharge: Y = Yes, Blank = No PT:   OT:   RN:   Equipment:   Other:     LOS: 1 day   Tonia Brooms 09/09/2012, 11:49 AM

## 2012-09-09 NOTE — Progress Notes (Signed)
Respiratory therapy note- ABG ordered, MD called to verify and to update patient's response to test. Pt. Is non interactive with sheet covers over head. sp02-on room air 97%, other vital signs taken at this time and documented. Transfer to S/D.

## 2012-09-09 NOTE — Progress Notes (Signed)
Pt refusing to go to MRI stating "too tired, I am not going this late." Prompted by this nurse as well as called Dr. Aundra Dubin who also talked with pt. Pt still refuses.

## 2012-09-09 NOTE — Consult Note (Signed)
Reason for Consult:RIGHT INDEX FINGER INFECTION Referring Physician: DR. Altamese Dilling is an 42 y.o. male.  HPI: [name] is an 42 y.o. male with DM, HTN and prior repair of fracture in arm had Come to the  emergency room on April 14 with complaints of throbbing right index pain. At that time an incision and drainage of the finger was performed without complication. X-ray was negative for any evidence of osteomyelitis. He was discharged with Bactrim. He followed up 2 days later for wound check. At that time, the finger was noted to have minimal cellulitis and cephalexin was added to his antibiotic regimen. He returned to the emergency room again on the April 21 reporting persistent finger pain and some missed antibiotic doses. X-ray was repeated again and showed no evidence of osteomyelitis. He was noted to be febrile to 103. On exam he had erythroderma of face, neck shoulders, with some exfoliation on back. His admission labs were pertinent for leukopenia and tranaminitis. Punch biopsies were performed on rash and he was admitted to Medicine team with concern for mx possible causes of his symptoms including ADR to sulfa or beta lactam, Staphylococcal toxic shock, erythroderma from severe staphylococcal infection   Past Medical History  Diagnosis Date  . Diabetes mellitus type II, uncontrolled 07/17/2006  . ESSENTIAL HYPERTENSION 07/17/2006  . HYPERLIPIDEMIA 11/24/2008  . Pneumonia 10/22/2011    Past Surgical History  Procedure Laterality Date  . Arm surgery      Family History  Problem Relation Age of Onset  . Hypertension Mother 19  . Cancer Mother     bladder cancer  . Alcohol abuse Father   . Hypertension Brother     Social History:  reports that he has never smoked. He has never used smokeless tobacco. He reports that he does not drink alcohol or use illicit drugs.  Allergies:  Allergies  Allergen Reactions  . Bactrim (Sulfamethoxazole W-Trimethoprim)     Rash     Medications: I have reviewed the patient's current medications.  Results for orders placed during the hospital encounter of 09/08/12 (from the past 48 hour(s))  CBC WITH DIFFERENTIAL     Status: Abnormal   Collection Time    09/08/12  8:10 PM      Result Value Range   WBC 2.8 (*) 4.0 - 10.5 K/uL   RBC 5.50  4.22 - 5.81 MIL/uL   Hemoglobin 15.4  13.0 - 17.0 g/dL   HCT 41.6  39.0 - 52.0 %   MCV 75.6 (*) 78.0 - 100.0 fL   MCH 28.0  26.0 - 34.0 pg   MCHC 37.0 (*) 30.0 - 36.0 g/dL   RDW 12.2  11.5 - 15.5 %   Platelets 196  150 - 400 K/uL   Neutrophils Relative 44  43 - 77 %   Lymphocytes Relative 49 (*) 12 - 46 %   Monocytes Relative 4  3 - 12 %   Eosinophils Relative 1  0 - 5 %   Basophils Relative 2 (*) 0 - 1 %   Neutro Abs 1.2 (*) 1.7 - 7.7 K/uL   Lymphs Abs 1.4  0.7 - 4.0 K/uL   Monocytes Absolute 0.1  0.1 - 1.0 K/uL   Eosinophils Absolute 0.0  0.0 - 0.7 K/uL   Basophils Absolute 0.1  0.0 - 0.1 K/uL   WBC Morphology RARE ATYPICAL LYMPHOCYTES     PROTIME-INR     Status: None   Collection Time  09/08/12  8:10 PM      Result Value Range   Prothrombin Time 13.6  11.6 - 15.2 seconds   INR 1.05  0.00 - 1.49  APTT     Status: None   Collection Time    09/08/12  8:10 PM      Result Value Range   aPTT 36  24 - 37 seconds  SEDIMENTATION RATE     Status: None   Collection Time    09/08/12  8:10 PM      Result Value Range   Sed Rate 8  0 - 16 mm/hr  LACTIC ACID, PLASMA     Status: None   Collection Time    09/08/12 10:01 PM      Result Value Range   Lactic Acid, Venous 1.9  0.5 - 2.2 mmol/L  GLUCOSE, CAPILLARY     Status: Abnormal   Collection Time    09/08/12 10:52 PM      Result Value Range   Glucose-Capillary 404 (*) 70 - 99 mg/dL  URINALYSIS, ROUTINE W REFLEX MICROSCOPIC     Status: Abnormal   Collection Time    09/09/12 12:37 AM      Result Value Range   Color, Urine YELLOW  YELLOW   APPearance CLEAR  CLEAR   Specific Gravity, Urine 1.034 (*) 1.005 - 1.030    pH 6.0  5.0 - 8.0   Glucose, UA >1000 (*) NEGATIVE mg/dL   Hgb urine dipstick NEGATIVE  NEGATIVE   Bilirubin Urine NEGATIVE  NEGATIVE   Ketones, ur 15 (*) NEGATIVE mg/dL   Protein, ur NEGATIVE  NEGATIVE mg/dL   Urobilinogen, UA 1.0  0.0 - 1.0 mg/dL   Nitrite NEGATIVE  NEGATIVE   Leukocytes, UA NEGATIVE  NEGATIVE  URINE MICROSCOPIC-ADD ON     Status: None   Collection Time    09/09/12 12:37 AM      Result Value Range   Squamous Epithelial / LPF RARE  RARE   Bacteria, UA RARE  RARE  GLUCOSE, CAPILLARY     Status: Abnormal   Collection Time    09/09/12  6:44 AM      Result Value Range   Glucose-Capillary 163 (*) 70 - 99 mg/dL  MRSA PCR SCREENING     Status: None   Collection Time    09/09/12 11:59 AM      Result Value Range   MRSA by PCR NEGATIVE  NEGATIVE   Comment:            The GeneXpert MRSA Assay (FDA     approved for NASAL specimens     only), is one component of a     comprehensive MRSA colonization     surveillance program. It is not     intended to diagnose MRSA     infection nor to guide or     monitor treatment for     MRSA infections.  LACTIC ACID, PLASMA     Status: None   Collection Time    09/09/12 12:05 PM      Result Value Range   Lactic Acid, Venous 2.2  0.5 - 2.2 mmol/L  GLUCOSE, CAPILLARY     Status: Abnormal   Collection Time    09/09/12 12:17 PM      Result Value Range   Glucose-Capillary 189 (*) 70 - 99 mg/dL   Comment 1 Notify RN    BLOOD GAS, ARTERIAL     Status: Abnormal   Collection Time  09/09/12  1:37 PM      Result Value Range   FIO2 0.21     pH, Arterial 7.381  7.350 - 7.450   pCO2 arterial 36.0  35.0 - 45.0 mmHg   pO2, Arterial 75.3 (*) 80.0 - 100.0 mmHg   Bicarbonate 20.8  20.0 - 24.0 mEq/L   TCO2 22.0  0 - 100 mmol/L   Acid-base deficit 3.4 (*) 0.0 - 2.0 mmol/L   O2 Saturation 94.7     Patient temperature 98.6     Collection site RIGHT RADIAL     Drawn by [id number]     Sample type ARTERIAL     Allens test (pass/fail) PASS   PASS  COMPREHENSIVE METABOLIC PANEL     Status: Abnormal   Collection Time    09/09/12  1:47 PM      Result Value Range   Sodium 131 (*) 135 - 145 mEq/L   Potassium 4.1  3.5 - 5.1 mEq/L   Chloride 99  96 - 112 mEq/L   CO2 23  19 - 32 mEq/L   Glucose, Bld 197 (*) 70 - 99 mg/dL   BUN 13  6 - 23 mg/dL   Creatinine, Ser 0.77  0.50 - 1.35 mg/dL   Calcium 7.1 (*) 8.4 - 10.5 mg/dL   Total Protein 6.0  6.0 - 8.3 g/dL   Albumin 2.4 (*) 3.5 - 5.2 g/dL   AST 126 (*) 0 - 37 U/L   ALT 72 (*) 0 - 53 U/L   Alkaline Phosphatase 72  39 - 117 U/L   Total Bilirubin 0.2 (*) 0.3 - 1.2 mg/dL   GFR calc non Af Amer >90  >90 mL/min   GFR calc Af Amer >90  >90 mL/min   Comment:            The eGFR has been calculated     using the CKD EPI equation.     This calculation has not been     validated in all clinical     situations.     eGFR's persistently     <90 mL/min signify     possible Chronic Kidney Disease.  CBC WITH DIFFERENTIAL     Status: Abnormal   Collection Time    09/09/12  1:47 PM      Result Value Range   WBC 2.8 (*) 4.0 - 10.5 K/uL   RBC 4.56  4.22 - 5.81 MIL/uL   Hemoglobin 12.5 (*) 13.0 - 17.0 g/dL   Comment: REPEATED TO VERIFY     DELTA CHECK NOTED   HCT 34.8 (*) 39.0 - 52.0 %   MCV 76.3 (*) 78.0 - 100.0 fL   MCH 27.4  26.0 - 34.0 pg   MCHC 35.9  30.0 - 36.0 g/dL   RDW 12.3  11.5 - 15.5 %   Platelets 178  150 - 400 K/uL   Neutrophils Relative 34 (*) 43 - 77 %   Lymphocytes Relative 55 (*) 12 - 46 %   Monocytes Relative 7  3 - 12 %   Eosinophils Relative 3  0 - 5 %   Basophils Relative 1  0 - 1 %   Neutro Abs 1.0 (*) 1.7 - 7.7 K/uL   Lymphs Abs 1.5  0.7 - 4.0 K/uL   Monocytes Absolute 0.2  0.1 - 1.0 K/uL   Eosinophils Absolute 0.1  0.0 - 0.7 K/uL   Basophils Absolute 0.0  0.0 - 0.1 K/uL   WBC Morphology FEW  ATYPICAL LYMPHS NOTED    CK     Status: None   Collection Time    09/09/12  1:47 PM      Result Value Range   Total CK 126  7 - 232 U/L  GAMMA GT     Status:  None   Collection Time    09/09/12  1:47 PM      Result Value Range   GGT 22  7 - 51 U/L  GLUCOSE, CAPILLARY     Status: None   Collection Time    09/09/12  4:56 PM      Result Value Range   Glucose-Capillary 73  70 - 99 mg/dL    No results found.  SEE CHART Blood pressure 111/72, pulse 89, temperature 99.5 F (37.5 C), temperature source Oral, resp. rate 13, height 5' 7.5" (1.715 m), weight 66.044 kg (145 lb 9.6 oz), SpO2 92.00%. GENERAL AWAKE ALERT NONTOXIC RIGHT HAND SMALL VOLAR WOUND OVER MIDDLE PHALANX, NO EVIDENCE OF DEEP SPACE INFECTION OR TENOSYNOVITIS. GOOD DIGITAL MOTION AND MINIMAL PAIN OVER FLEXOR SHEATH. GOOD DIGITAL MOTION TO INDEX. NO WOUNDS TO LONG/RING/SMALL FINGER TIP PERFUSED WITH GOOD SKIN TURGOR NO ASCENDING ERYTHEMA LYMPHANGITIS  Assessment/Plan: RIGHT INDEX FINGER ABSCESS S/P INCISION AND DRAINAGE WITHOUT EVIDENCE OF DEEP SPACE INFECTION OR TENOSYNOVITIS  PT SEEN/EXAMINED WITH ID AT BEDSIDE I DO NOT RECOMMEND IMAGING OF HAND. HE DOES NOT APPEAR TO HAVE ABSCESS OR FLEXOR SHEATH INFECTION CLINICALLY AND DO NOT BELIEVE MRI WOULD CHANGE CURRENT MANAGEMENT OK WITH ORAL ANTIBIOTICS AND LOCAL WOUND CARE CAN F/U WITH ME IN OFFICE. CONTACT ME VIA PHONE IF QUESTIONS [id number],    Linna Hoff 09/09/2012, 6:56 PM

## 2012-09-09 NOTE — H&P (Addendum)
INTERNAL MEDICINE TEACHING SERVICE Attending Admission Note  Date: 09/09/2012  Patient name: [name]  Medical record number: [id number]  Date of birth: 24-Sep-1970    I have seen and evaluated [name] and discussed their care with the Residency Team.  6 yr. Old AAM w/ pmhx significant for uncontrolled type 2 DM, HTN, HL, presented due to right finger pain, fever.  He was recently seen on 4/14 with right index pain and was noted to need an I&D of the site.  The procedure was performed and he was discharged with Bactrim. He followed up 2 days later and the site was noted to have slightly worsened with some cellulitic changes, he was prescribed Keflex.  He returned to the ED on 4/21 for worsening finger pain and was noted to have a fever of 103.  He was noted to be hyperglycemic to ~400.  He was discharged and with directions to continue previous abx treatment, referred to ortho hand and continue soaks at home. He was found on culture to have MRSA.  His mother was concerned about him and called our IM OPC due to recurrent fever.   He was admitted as a result. He was noted to be hypotensive on admission, febrile, and have skin changes throughout his back, upper chest, and arms.  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   Other considerations included drug rash and TEN/SJS and DRESS syndrome. This morning he was noted to have marginal BP's near MAP 60 and was continuing to receive IVF boluses. He is on appropriate treatment with Vancomycin and clindamycin.  Physical Exam: Blood pressure 84/46, pulse 87, temperature 99 F (37.2 C), temperature source Oral, resp. rate 20, height 5' 7.5" (1.715 m), weight 145 lb 9.6 oz (66.044 kg), SpO2 97.00%.  General: Vital signs reviewed and noted. Well-developed, well-nourished, in no acute distress; alert, appropriate and cooperative throughout  examination.  Head: Normocephalic, atraumatic.  Eyes: PERRL, EOMI, No signs of anemia or jaundince.  Nose: Mucous membranes moist, not inflammed, nonerythematous.  Throat: Oropharynx nonerythematous, no exudate appreciated.   Neck: No deformities, masses, or tenderness noted.Supple, No carotid Bruits, no JVD.  Lungs:  Normal respiratory effort. Clear to auscultation BL without crackles or wheezes.  Heart: RRR. S1 and S2 normal without gallop, murmur, or rubs.  Abdomen:  BS normoactive. Soft, Nondistended, non-tender.  No masses or organomegaly.  Extremities: Right index finger with ~2 cm incision site without drainage, but limited active ROM PIP/DIP joints. No lymphangitic streaking. Pulses intact UE and LE.    Neurologic: CN II-XII intact.  Skin: Erythroderma of neck, face, anterior upper chest, upper back, exfoliative scaling over back. Mucous membranes without involvement. Punch biopsy sites in back without bleeding or drainage or tenderness.    Lab results: Results for orders placed during the hospital encounter of 09/08/12 (from the past 24 hour(s))  CBC WITH DIFFERENTIAL     Status: Abnormal   Collection Time    09/08/12  8:10 PM      Result Value Range   WBC 2.8 (*) 4.0 - 10.5 K/uL   RBC 5.50  4.22 - 5.81 MIL/uL   Hemoglobin 15.4  13.0 - 17.0 g/dL   HCT 41.6  39.0 - 52.0 %   MCV 75.6 (*) 78.0 - 100.0 fL   MCH 28.0  26.0 - 34.0 pg   MCHC 37.0 (*) 30.0 - 36.0  g/dL   RDW 12.2  11.5 - 15.5 %   Platelets 196  150 - 400 K/uL   Neutrophils Relative 44  43 - 77 %   Lymphocytes Relative 49 (*) 12 - 46 %   Monocytes Relative 4  3 - 12 %   Eosinophils Relative 1  0 - 5 %   Basophils Relative 2 (*) 0 - 1 %   Neutro Abs 1.2 (*) 1.7 - 7.7 K/uL   Lymphs Abs 1.4  0.7 - 4.0 K/uL   Monocytes Absolute 0.1  0.1 - 1.0 K/uL   Eosinophils Absolute 0.0  0.0 - 0.7 K/uL   Basophils Absolute 0.1  0.0 - 0.1 K/uL   WBC Morphology RARE ATYPICAL LYMPHOCYTES     PROTIME-INR     Status: None    Collection Time    09/08/12  8:10 PM      Result Value Range   Prothrombin Time 13.6  11.6 - 15.2 seconds   INR 1.05  0.00 - 1.49  APTT     Status: None   Collection Time    09/08/12  8:10 PM      Result Value Range   aPTT 36  24 - 37 seconds  SEDIMENTATION RATE     Status: None   Collection Time    09/08/12  8:10 PM      Result Value Range   Sed Rate 8  0 - 16 mm/hr  LACTIC ACID, PLASMA     Status: None   Collection Time    09/08/12 10:01 PM      Result Value Range   Lactic Acid, Venous 1.9  0.5 - 2.2 mmol/L  GLUCOSE, CAPILLARY     Status: Abnormal   Collection Time    09/08/12 10:52 PM      Result Value Range   Glucose-Capillary 404 (*) 70 - 99 mg/dL  URINALYSIS, ROUTINE W REFLEX MICROSCOPIC     Status: Abnormal   Collection Time    09/09/12 12:37 AM      Result Value Range   Color, Urine YELLOW  YELLOW   APPearance CLEAR  CLEAR   Specific Gravity, Urine 1.034 (*) 1.005 - 1.030   pH 6.0  5.0 - 8.0   Glucose, UA >1000 (*) NEGATIVE mg/dL   Hgb urine dipstick NEGATIVE  NEGATIVE   Bilirubin Urine NEGATIVE  NEGATIVE   Ketones, ur 15 (*) NEGATIVE mg/dL   Protein, ur NEGATIVE  NEGATIVE mg/dL   Urobilinogen, UA 1.0  0.0 - 1.0 mg/dL   Nitrite NEGATIVE  NEGATIVE   Leukocytes, UA NEGATIVE  NEGATIVE  URINE MICROSCOPIC-ADD ON     Status: None   Collection Time    09/09/12 12:37 AM      Result Value Range   Squamous Epithelial / LPF RARE  RARE   Bacteria, UA RARE  RARE  GLUCOSE, CAPILLARY     Status: Abnormal   Collection Time    09/09/12  6:44 AM      Result Value Range   Glucose-Capillary 163 (*) 70 - 99 mg/dL  GLUCOSE, CAPILLARY     Status: Abnormal   Collection Time    09/09/12 12:17 PM      Result Value Range   Glucose-Capillary 189 (*) 70 - 99 mg/dL   Comment 1 Notify RN      Imaging results:  No results found.   Assessment and Plan: I agree with the formulated Assessment and Plan with the following changes:41 yr. Old  AAM w/ pmhx significant for uncontrolled  type 2 DM, HTN, HL, presented due to right finger pain, fever, with findings concerning for MRSA TSS/DRESS/TEN/SJS. - Continue to fluid resuscitate him, watch his UOP. Obtain ABG and lactic acid. Continue Vancomycin and Clindamycin. I suspected toxin mediated injury, but this could be drug reaction as well. I would not add systemic steroids at this time. I would consult ortho/hand to evaluate his finger. Check CPK due to elevated AST. -Follow CMP, CBC w/ diff daily. Concern for additional organ involvement.   -Uncontrolled DM: insulin coverage. Watch for hypoglycemia in setting of his illness. -He has a very high SG of his urine and his urine is as concentrated as is physiologically possible. He is clearly volume depleted, I would be aggressive with fluid resuscitation. Watch his UOP carefully and his renal function over the next days. -Obtain GGT as his CPK is normal to see if the AST is coming from liver.   The treatment plan was discussed in detail with the patient.  Alternatives to treatment, side effects, risks and benefits, and complications were discussed with the patient. Informed consent was obtained. The patient agrees to proceed with the current treatment plan.  Dominic Pea, [place] 4/23/20141:48 PM

## 2012-09-09 NOTE — Consult Note (Signed)
[facility] for Infectious Disease    Date of Admission:  09/08/2012  Date of Consult:  09/09/2012  Reason for Consult:MRSA finger infection, fever, erythroderma Referring Physician: Dr. Murlean Caller   HPI: [name] is an 42 y.o. male with DM, HTN and prior repair of fracture in arm had  Come to the  emergency room on April 14 with complaints of throbbing right index pain.  At that time an incision and drainage of the finger was performed without complication. X-ray was negative for any evidence of osteomyelitis. He was discharged with Bactrim. He followed up 2 days later for wound check. At that time, the finger was noted to have minimal cellulitis and cephalexin was added to his antibiotic regimen. He returned to the emergency room again on the April 21 reporting persistent finger pain and some missed antibiotic doses. X-ray was repeated again and showed no evidence of osteomyelitis. He was noted to be febrile to 103. On exam he had erythroderma of face, neck shoulders, with some exfoliation on back. His admission labs were pertinent for leukopenia and tranaminitis. Punch biopsies were performed on rash and he was admitted to Medicine team with concern for mx possible causes of his symptoms including ADR to sulfa or beta lactam, Staphylococcal toxic shock, erythroderma from severe staphylococcal infection  He has also had borderline BP despite mx IVF boluses (pt is also normally HTNsive at baseline) he has edema of his eyelids and ulceration on left lateral aspect of tongue but no other mucosal abnormalities.  I spent greater than 60 minutes with the patient including greater than 50% of time in face to face counsel of the patient and in coordination of their care with primary team and from Ortho hand surgery.      Past Medical History  Diagnosis Date  . Diabetes mellitus type II, uncontrolled 07/17/2006  . ESSENTIAL HYPERTENSION 07/17/2006  . HYPERLIPIDEMIA 11/24/2008  . Pneumonia  10/22/2011    Past Surgical History  Procedure Laterality Date  . Arm surgery    ergies:   Allergies  Allergen Reactions  . Bactrim (Sulfamethoxazole W-Trimethoprim)     Rash     Medications: I have reviewed patients current medications as documented in Epic Anti-infectives   Start     Dose/Rate Route Frequency Ordered Stop   09/09/12 2200  linezolid (ZYVOX) tablet 600 mg     600 mg Oral Every 12 hours 09/09/12 1657     09/08/12 2200  clindamycin (CLEOCIN) IVPB 600 mg  Status:  Discontinued     600 mg 100 mL/hr over 30 Minutes Intravenous 3 times per day 09/08/12 2128 09/09/12 1657   09/08/12 2000  vancomycin (VANCOCIN) IVPB 1000 mg/200 mL premix     1,000 mg 200 mL/hr over 60 Minutes Intravenous Every 12 hours 09/08/12 1937        Social History:  reports that he has never smoked. He has never used smokeless tobacco. He reports that he does not drink alcohol or use illicit drugs.  Family History  Problem Relation Age of Onset  . Hypertension Mother 74  . Cancer Mother     bladder cancer  . Alcohol abuse Father   . Hypertension Brother     As in HPI and primary teams notes otherwise 12 point review of systems is negative  Blood pressure 102/61, pulse 87, temperature 99 F (37.2 C), temperature source Oral, resp. rate 18, height 5' 7.5" (1.715 m), weight 145 lb 9.6 oz (66.044 kg), SpO2  92.00%. General: sleepy buried in covers but arousable and awake, oriented x3, not in any acute distress. HEENT: anicteric sclera, eyelid edema EOMI, oropharynx clear and without exudate CVS regular rate, normal r,  no murmur rubs or gallops Chest: clear to auscultation bilaterally, no wheezing, rales or rhonchi Abdomen: soft nontender, nondistended, normal bowel sounds, Extremities: no  clubbing or edema noted bilaterally Skin: diffuse erythroderma prominent on face, chest, back with some exfoliation on back Finger with open wound without overt pus but tenderness about the site. Hand  itself and the arm are nontender Neuro: nonfocal, strength and sensation intact   Results for orders placed during the hospital encounter of 09/08/12 (from the past 48 hour(s))  CBC WITH DIFFERENTIAL     Status: Abnormal   Collection Time    09/08/12  8:10 PM      Result Value Range   WBC 2.8 (*) 4.0 - 10.5 K/uL   RBC 5.50  4.22 - 5.81 MIL/uL   Hemoglobin 15.4  13.0 - 17.0 g/dL   HCT 41.6  39.0 - 52.0 %   MCV 75.6 (*) 78.0 - 100.0 fL   MCH 28.0  26.0 - 34.0 pg   MCHC 37.0 (*) 30.0 - 36.0 g/dL   RDW 12.2  11.5 - 15.5 %   Platelets 196  150 - 400 K/uL   Neutrophils Relative 44  43 - 77 %   Lymphocytes Relative 49 (*) 12 - 46 %   Monocytes Relative 4  3 - 12 %   Eosinophils Relative 1  0 - 5 %   Basophils Relative 2 (*) 0 - 1 %   Neutro Abs 1.2 (*) 1.7 - 7.7 K/uL   Lymphs Abs 1.4  0.7 - 4.0 K/uL   Monocytes Absolute 0.1  0.1 - 1.0 K/uL   Eosinophils Absolute 0.0  0.0 - 0.7 K/uL   Basophils Absolute 0.1  0.0 - 0.1 K/uL   WBC Morphology RARE ATYPICAL LYMPHOCYTES     PROTIME-INR     Status: None   Collection Time    09/08/12  8:10 PM      Result Value Range   Prothrombin Time 13.6  11.6 - 15.2 seconds   INR 1.05  0.00 - 1.49  APTT     Status: None   Collection Time    09/08/12  8:10 PM      Result Value Range   aPTT 36  24 - 37 seconds  SEDIMENTATION RATE     Status: None   Collection Time    09/08/12  8:10 PM      Result Value Range   Sed Rate 8  0 - 16 mm/hr  LACTIC ACID, PLASMA     Status: None   Collection Time    09/08/12 10:01 PM      Result Value Range   Lactic Acid, Venous 1.9  0.5 - 2.2 mmol/L  GLUCOSE, CAPILLARY     Status: Abnormal   Collection Time    09/08/12 10:52 PM      Result Value Range   Glucose-Capillary 404 (*) 70 - 99 mg/dL  URINALYSIS, ROUTINE W REFLEX MICROSCOPIC     Status: Abnormal   Collection Time    09/09/12 12:37 AM      Result Value Range   Color, Urine YELLOW  YELLOW   APPearance CLEAR  CLEAR   Specific Gravity, Urine 1.034 (*)  1.005 - 1.030   pH 6.0  5.0 - 8.0   Glucose, UA >  1000 (*) NEGATIVE mg/dL   Hgb urine dipstick NEGATIVE  NEGATIVE   Bilirubin Urine NEGATIVE  NEGATIVE   Ketones, ur 15 (*) NEGATIVE mg/dL   Protein, ur NEGATIVE  NEGATIVE mg/dL   Urobilinogen, UA 1.0  0.0 - 1.0 mg/dL   Nitrite NEGATIVE  NEGATIVE   Leukocytes, UA NEGATIVE  NEGATIVE  URINE MICROSCOPIC-ADD ON     Status: None   Collection Time    09/09/12 12:37 AM      Result Value Range   Squamous Epithelial / LPF RARE  RARE   Bacteria, UA RARE  RARE  GLUCOSE, CAPILLARY     Status: Abnormal   Collection Time    09/09/12  6:44 AM      Result Value Range   Glucose-Capillary 163 (*) 70 - 99 mg/dL  MRSA PCR SCREENING     Status: None   Collection Time    09/09/12 11:59 AM      Result Value Range   MRSA by PCR NEGATIVE  NEGATIVE   Comment:            The GeneXpert MRSA Assay (FDA     approved for NASAL specimens     only), is one component of a     comprehensive MRSA colonization     surveillance program. It is not     intended to diagnose MRSA     infection nor to guide or     monitor treatment for     MRSA infections.  LACTIC ACID, PLASMA     Status: None   Collection Time    09/09/12 12:05 PM      Result Value Range   Lactic Acid, Venous 2.2  0.5 - 2.2 mmol/L  GLUCOSE, CAPILLARY     Status: Abnormal   Collection Time    09/09/12 12:17 PM      Result Value Range   Glucose-Capillary 189 (*) 70 - 99 mg/dL   Comment 1 Notify RN    BLOOD GAS, ARTERIAL     Status: Abnormal   Collection Time    09/09/12  1:37 PM      Result Value Range   FIO2 0.21     pH, Arterial 7.381  7.350 - 7.450   pCO2 arterial 36.0  35.0 - 45.0 mmHg   pO2, Arterial 75.3 (*) 80.0 - 100.0 mmHg   Bicarbonate 20.8  20.0 - 24.0 mEq/L   TCO2 22.0  0 - 100 mmol/L   Acid-base deficit 3.4 (*) 0.0 - 2.0 mmol/L   O2 Saturation 94.7     Patient temperature 98.6     Collection site RIGHT RADIAL     Drawn by [id number]     Sample type ARTERIAL     Allens test  (pass/fail) PASS  PASS  COMPREHENSIVE METABOLIC PANEL     Status: Abnormal   Collection Time    09/09/12  1:47 PM      Result Value Range   Sodium 131 (*) 135 - 145 mEq/L   Potassium 4.1  3.5 - 5.1 mEq/L   Chloride 99  96 - 112 mEq/L   CO2 23  19 - 32 mEq/L   Glucose, Bld 197 (*) 70 - 99 mg/dL   BUN 13  6 - 23 mg/dL   Creatinine, Ser 0.77  0.50 - 1.35 mg/dL   Calcium 7.1 (*) 8.4 - 10.5 mg/dL   Total Protein 6.0  6.0 - 8.3 g/dL   Albumin 2.4 (*) 3.5 - 5.2 g/dL  AST 126 (*) 0 - 37 U/L   ALT 72 (*) 0 - 53 U/L   Alkaline Phosphatase 72  39 - 117 U/L   Total Bilirubin 0.2 (*) 0.3 - 1.2 mg/dL   GFR calc non Af Amer >90  >90 mL/min   GFR calc Af Amer >90  >90 mL/min   Comment:            The eGFR has been calculated     using the CKD EPI equation.     This calculation has not been     validated in all clinical     situations.     eGFR's persistently     <90 mL/min signify     possible Chronic Kidney Disease.  CBC WITH DIFFERENTIAL     Status: Abnormal   Collection Time    09/09/12  1:47 PM      Result Value Range   WBC 2.8 (*) 4.0 - 10.5 K/uL   RBC 4.56  4.22 - 5.81 MIL/uL   Hemoglobin 12.5 (*) 13.0 - 17.0 g/dL   Comment: REPEATED TO VERIFY     DELTA CHECK NOTED   HCT 34.8 (*) 39.0 - 52.0 %   MCV 76.3 (*) 78.0 - 100.0 fL   MCH 27.4  26.0 - 34.0 pg   MCHC 35.9  30.0 - 36.0 g/dL   RDW 12.3  11.5 - 15.5 %   Platelets 178  150 - 400 K/uL   Neutrophils Relative 34 (*) 43 - 77 %   Lymphocytes Relative 55 (*) 12 - 46 %   Monocytes Relative 7  3 - 12 %   Eosinophils Relative 3  0 - 5 %   Basophils Relative 1  0 - 1 %   Neutro Abs 1.0 (*) 1.7 - 7.7 K/uL   Lymphs Abs 1.5  0.7 - 4.0 K/uL   Monocytes Absolute 0.2  0.1 - 1.0 K/uL   Eosinophils Absolute 0.1  0.0 - 0.7 K/uL   Basophils Absolute 0.0  0.0 - 0.1 K/uL   WBC Morphology FEW ATYPICAL LYMPHS NOTED    CK     Status: None   Collection Time    09/09/12  1:47 PM      Result Value Range   Total CK 126  7 - 232 U/L  GAMMA  GT     Status: None   Collection Time    09/09/12  1:47 PM      Result Value Range   GGT 22  7 - 51 U/L      Component Value Date/Time   SDES WOUND RIGHT FINGER 08/31/2012 2235   SPECREQUEST NONE 08/31/2012 2235   CULT  Value: MODERATE METHICILLIN RESISTANT STAPHYLOCOCCUS AUREUS Note: CRITICAL RESULT CALLED TO, READ BACK BY AND VERIFIED WITH: PATTY BY INGRAM A 11/17 10AM 08/31/2012 2235   REPTSTATUS 09/03/2012 FINAL 08/31/2012 2235   No results found.   Recent Results (from the past 720 hour(s))  WOUND CULTURE     Status: None   Collection Time    08/31/12 10:35 PM      Result Value Range Status   Specimen Description WOUND RIGHT FINGER   Final   Special Requests NONE   Final   Gram Stain     Final   Value: RARE WBC PRESENT,BOTH PMN AND MONONUCLEAR     RARE SQUAMOUS EPITHELIAL CELLS PRESENT     NO ORGANISMS SEEN   Culture     Final   Value: MODERATE  METHICILLIN RESISTANT STAPHYLOCOCCUS AUREUS     Note: CRITICAL RESULT CALLED TO, READ BACK BY AND VERIFIED WITH: PATTY BY INGRAM A 11/17 10AM   Report Status 09/03/2012 FINAL   Final   Organism ID, Bacteria METHICILLIN RESISTANT STAPHYLOCOCCUS AUREUS   Final  MRSA PCR SCREENING     Status: None   Collection Time    09/09/12 11:59 AM      Result Value Range Status   MRSA by PCR NEGATIVE  NEGATIVE Final   Comment:            The GeneXpert MRSA Assay (FDA     approved for NASAL specimens     only), is one component of a     comprehensive MRSA colonization     surveillance program. It is not     intended to diagnose MRSA     infection nor to guide or     monitor treatment for     MRSA infections.     Impression/Recommendation  42 yo with DM, HTN and MRSA hand infection given bactrim , then keflex (why keflex with mRSA?) now with persistent fevers, erythroderma, leukopenia, hypotension  #1 Fevers: concern must be obviously for deeper infection, in hand, or possibly even blood stream infection. Certainly he could also have a S.  Aureus  toxin mediated pathology going on. Finally his fevers could also be part of presentation of ADR  --I would change antibiotics to zyvox (will have same inhibition of ribosomal protein synthesis as clinda with less risk for CDI --IVF supportive therapy --I would consider imaging the hand with MRI, though Dr Caralyn Guile is not suspicious for deep infection in the hand based on the exam --fu blood cultures closely  #2 Erthroderma" agree with differential of ADR to sulfa, beta lactam vs toxin mediated pathology with bandaged finger, vs erythroderma from severe Staph aueus infection  #3 leukopenia: likely secondary to his systemic illness whether this be due to aDr or sever infection  #4 Screening: would check HIV    Thank you so much for this interesting consult  [facility] [place] [phone] (pager) [phone] (office) 09/09/2012, 5:51 PM  [place] 09/09/2012, 5:51 PM

## 2012-09-09 NOTE — Progress Notes (Signed)
UR COMPLETED  

## 2012-09-09 NOTE — Progress Notes (Signed)
Called Radiology about patient refusing MRI and put order in for tomorrow morning per patient request. Patient says he will go in the morning.

## 2012-09-09 NOTE — Progress Notes (Signed)
Report called to [facility] on 2600. Pt being transferred to 2608 at this time. Rapid response at bedside to help with transport.

## 2012-09-10 ENCOUNTER — Encounter (HOSPITAL_COMMUNITY): Payer: Self-pay | Admitting: *Deleted

## 2012-09-10 ENCOUNTER — Inpatient Hospital Stay (HOSPITAL_COMMUNITY): Payer: MEDICAID

## 2012-09-10 DIAGNOSIS — S27309A Unspecified injury of lung, unspecified, initial encounter: Secondary | ICD-10-CM

## 2012-09-10 DIAGNOSIS — Z5189 Encounter for other specified aftercare: Secondary | ICD-10-CM

## 2012-09-10 DIAGNOSIS — L492 Exfoliation due to erythematous condition involving 20-29 percent of body surface: Secondary | ICD-10-CM

## 2012-09-10 DIAGNOSIS — R7401 Elevation of levels of liver transaminase levels: Secondary | ICD-10-CM

## 2012-09-10 DIAGNOSIS — I517 Cardiomegaly: Secondary | ICD-10-CM

## 2012-09-10 DIAGNOSIS — E871 Hypo-osmolality and hyponatremia: Secondary | ICD-10-CM

## 2012-09-10 DIAGNOSIS — A4902 Methicillin resistant Staphylococcus aureus infection, unspecified site: Secondary | ICD-10-CM

## 2012-09-10 DIAGNOSIS — I959 Hypotension, unspecified: Secondary | ICD-10-CM

## 2012-09-10 LAB — COMPREHENSIVE METABOLIC PANEL
ALT: 54 U/L — ABNORMAL HIGH (ref 0–53)
AST: 72 U/L — ABNORMAL HIGH (ref 0–37)
Albumin: 2.2 g/dL — ABNORMAL LOW (ref 3.5–5.2)
Alkaline Phosphatase: 72 U/L (ref 39–117)
BUN: 5 mg/dL — ABNORMAL LOW (ref 6–23)
CO2: 21 mEq/L (ref 19–32)
Calcium: 6.7 mg/dL — ABNORMAL LOW (ref 8.4–10.5)
Chloride: 106 mEq/L (ref 96–112)
Creatinine, Ser: 0.71 mg/dL (ref 0.50–1.35)
GFR calc Af Amer: >90 mL/min (ref >90)
GFR calc non Af Amer: >90 mL/min (ref >90)
Glucose, Bld: 124 mg/dL — ABNORMAL HIGH (ref 70–99)
Potassium: 3.4 mEq/L — ABNORMAL LOW (ref 3.5–5.1)
Sodium: 136 mEq/L (ref 135–145)
Total Bilirubin: 0.2 mg/dL — ABNORMAL LOW (ref 0.3–1.2)
Total Protein: 5.3 g/dL — ABNORMAL LOW (ref 6.0–8.3)

## 2012-09-10 LAB — DIFFERENTIAL
Basophils Absolute: 0 10*3/uL (ref 0.0–0.1)
Basophils Relative: 1 % (ref 0–1)
Eosinophils Absolute: 0.1 10*3/uL (ref 0.0–0.7)
Eosinophils Relative: 2 % (ref 0–5)
Lymphocytes Relative: 46 % (ref 12–46)
Lymphs Abs: 1.9 10*3/uL (ref 0.7–4.0)
Monocytes Absolute: 0.4 10*3/uL (ref 0.1–1.0)
Monocytes Relative: 10 % (ref 3–12)
Neutro Abs: 1.7 10*3/uL (ref 1.7–7.7)
Neutrophils Relative %: 41 % — ABNORMAL LOW (ref 43–77)

## 2012-09-10 LAB — HIV ANTIBODY (ROUTINE TESTING W REFLEX): HIV: NONREACTIVE

## 2012-09-10 LAB — GLUCOSE, CAPILLARY
Glucose-Capillary: 78 mg/dL (ref 70–99)
Glucose-Capillary: 157 mg/dL — ABNORMAL HIGH (ref 70–99)
Glucose-Capillary: 147 mg/dL — ABNORMAL HIGH (ref 70–99)
Glucose-Capillary: 209 mg/dL — ABNORMAL HIGH (ref 70–99)

## 2012-09-10 LAB — CBC
HCT: 32.6 % — ABNORMAL LOW (ref 39.0–52.0)
Hemoglobin: 11.3 g/dL — ABNORMAL LOW (ref 13.0–17.0)
MCH: 26.8 pg (ref 26.0–34.0)
MCHC: 34.7 g/dL (ref 30.0–36.0)
MCV: 77.4 fL — ABNORMAL LOW (ref 78.0–100.0)
Platelets: 176 10*3/uL (ref 150–400)
RBC: 4.21 MIL/uL — ABNORMAL LOW (ref 4.22–5.81)
RDW: 12.2 % (ref 11.5–15.5)
WBC: 4.4 10*3/uL (ref 4.0–10.5)

## 2012-09-10 LAB — PATHOLOGIST SMEAR REVIEW

## 2012-09-10 LAB — PROCALCITONIN: Procalcitonin: <0.1 ng/mL

## 2012-09-10 MED ORDER — GADOBENATE DIMEGLUMINE 529 MG/ML IV SOLN
15.0000 mL | Freq: Once | INTRAVENOUS | Status: AC
  Administered 2012-09-10: 15 mL via INTRAVENOUS

## 2012-09-10 MED ORDER — SODIUM CHLORIDE 0.9 % IV BOLUS (SEPSIS)
1000.0000 mL | Freq: Once | INTRAVENOUS | Status: AC
  Administered 2012-09-10: 1000 mL via INTRAVENOUS

## 2012-09-10 MED ORDER — POTASSIUM CHLORIDE CRYS ER 20 MEQ PO TBCR
40.0000 meq | EXTENDED_RELEASE_TABLET | Freq: Once | ORAL | Status: AC
  Administered 2012-09-10: 40 meq via ORAL
  Filled 2012-09-10: qty 2

## 2012-09-10 MED ORDER — INSULIN ASPART 100 UNIT/ML ~~LOC~~ SOLN
0.0000 [IU] | Freq: Three times a day (TID) | SUBCUTANEOUS | Status: DC
  Administered 2012-09-10: 1 [IU] via SUBCUTANEOUS
  Administered 2012-09-10: 2 [IU] via SUBCUTANEOUS
  Administered 2012-09-11: 5 [IU] via SUBCUTANEOUS
  Administered 2012-09-11: 3 [IU] via SUBCUTANEOUS

## 2012-09-10 MED ORDER — DIPHENHYDRAMINE HCL 25 MG PO CAPS
25.0000 mg | ORAL_CAPSULE | Freq: Four times a day (QID) | ORAL | Status: DC
  Administered 2012-09-11: 25 mg via ORAL
  Filled 2012-09-10 (×4): qty 1

## 2012-09-10 MED ORDER — FAMOTIDINE 40 MG PO TABS
40.0000 mg | ORAL_TABLET | Freq: Two times a day (BID) | ORAL | Status: DC
  Administered 2012-09-10 – 2012-09-11 (×3): 40 mg via ORAL
  Filled 2012-09-10 (×5): qty 1

## 2012-09-10 MED ORDER — METHYLPREDNISOLONE SODIUM SUCC 40 MG IJ SOLR
40.0000 mg | Freq: Four times a day (QID) | INTRAMUSCULAR | Status: DC
  Administered 2012-09-10 (×2): 40 mg via INTRAVENOUS
  Filled 2012-09-10 (×4): qty 1

## 2012-09-10 MED ORDER — DIPHENHYDRAMINE HCL 50 MG/ML IJ SOLN
50.0000 mg | Freq: Four times a day (QID) | INTRAMUSCULAR | Status: DC
  Administered 2012-09-10 (×2): 50 mg via INTRAVENOUS
  Filled 2012-09-10 (×2): qty 1

## 2012-09-10 MED ORDER — PHENYLEPHRINE HCL 10 MG/ML IJ SOLN
30.0000 ug/min | INTRAVENOUS | Status: DC
  Filled 2012-09-10: qty 1

## 2012-09-10 MED ORDER — METHYLPREDNISOLONE SODIUM SUCC 40 MG IJ SOLR
40.0000 mg | Freq: Two times a day (BID) | INTRAMUSCULAR | Status: DC
  Filled 2012-09-10 (×4): qty 1

## 2012-09-10 NOTE — Progress Notes (Signed)
Called report to [facility], transferred pt with no complications.

## 2012-09-10 NOTE — Progress Notes (Signed)
PCCM  Pt looks good this PM.  Tfr to Floor and prob d/c AM 4/25  Mariel Sleet Beeper  [phone]  Cell  [phone]  If no response or cell goes to voicemail, call beeper [phone]

## 2012-09-10 NOTE — Clinical Social Work Psychosocial (Signed)
     Clinical Social Work Department BRIEF PSYCHOSOCIAL ASSESSMENT 09/10/2012  Patient:  [name], [name]     Account Number:  [id number]     Admit date:  09/08/2012  Clinical Social Worker:  Katrinka Blazing  Date/Time:  09/10/2012 03:46 PM  Referred by:  Physician  Date Referred:  09/10/2012 Referred for  Advanced Directives   Other Referral:   Interview type:  Patient Other interview type:    PSYCHOSOCIAL DATA Living Status:  FAMILY Admitted from facility:   Level of care:   Primary support name:  [name]: [phone] Primary support relationship to patient:  PARENT Degree of support available:   Unknown.    CURRENT CONCERNS Current Concerns  Other - See comment   Other Concerns:   Advance Directives.    SOCIAL WORK ASSESSMENT / PLAN Clinical Social Worker received referral from RN indicating concerns related to pt's support system.  Per RN, pt is still legally married but this relationship is questionable as they are separated and pt's mother reported to RN that wife has a restraining order against pt.  CSW reviewed chart and met with pt at bedside.  CSW introduced self, explained role, and provided support.  CSW reviewed HCPOA information and the importance of this document.  Pt stated understanding.  CSW offered to leave HCPOA paperwork with pt for him to review with his mother, who has been at bedside.  Pt stated understanding and thanked CSW for intervention.    CSW staffed case with RN.  CSW suggested a Psych Consult to assess for potential mental health issues and resources.   Assessment/plan status:  Information/Referral to [employer] Other assessment/ plan:   Information/referral to community resources:   Research officer, political party.    PATIENTS/FAMILYS RESPONSE TO PLAN OF CARE: Pt was engaged in conversation but demonstrated a flat affect.  Pt thanked CSW for intervention.

## 2012-09-10 NOTE — Progress Notes (Addendum)
PULMONARY  / CRITICAL CARE MEDICINE  Name: [name] MRN: [id number] DOB: July 16, 1970    ADMISSION DATE:  09/08/2012 CONSULTATION DATE:  09/10/12  REFERRING MD :  Dr. Murlean Caller PRIMARY SERVICE: Internal Medicine Teaching Service  CHIEF COMPLAINT:  Hypotension  BRIEF PATIENT DESCRIPTION: 42 yo man admitted for skin infection of his right index finger s/p I&D on 08/31/12. He was prescribed Bactrim and Keflex for possible cellulitis and Xray did not show osteomyelitis.  He became hypotensive with skin desquamation.     SIGNIFICANT EVENTS / STUDIES:  08/31/12>>> I&D of right index 09/08/12>>> punch skin bx of back   LINES / TUBES: PIV  CULTURES: Blood cultures 09/08/12>>> Wound culture 08/31/12>>> Punch skin bx of back 09/08/12>>>  ANTIBIOTICS: 09/08/12 Vanco>>>09/08/12 09/09/12 Zyvox>>>  SUBJECTIVE: patient does not want to be "bothered because it is 3AM".  VITAL SIGNS: Temp:  [99 F (37.2 C)-99.9 F (37.7 C)] 99.4 F (37.4 C) (04/24 0308) Pulse Rate:  [80-113] 80 (04/24 0800) Resp:  [4-24] 18 (04/24 0800) BP: (77-117)/(42-85) 88/54 mmHg (04/24 0800) SpO2:  [87 %-100 %] 88 % (04/24 0800) Weight:  [164 lb 3.9 oz (74.5 kg)] 164 lb 3.9 oz (74.5 kg) (04/24 0530) HEMODYNAMICS: CV stable AM 4/24   On RA sats 94%    INTAKE / OUTPUT: Intake/Output     04/23 0701 - 04/24 0700 04/24 0701 - 04/25 0700   I.V. (mL/kg) 4515 (60.6)    IV Piggyback 2150    Total Intake(mL/kg) 6665 (89.5)    Urine (mL/kg/hr) 1400 (0.8)    Total Output 1400     Net +5265          Stool Occurrence 2 x      PHYSICAL EXAMINATION: General:  Alert and co-operative with Dr. Joya Gaskins. Neuro:  Oriented to time and place HEENT:  No oral ulcers, blisters, rash or other lesions concerning for SJS Cardiovascular:  S1S2, no m/g/r Lungs:  +crackles bilaterally, no wheezes. Abdomen:  Soft non-tender, +BS Musculoskeletal:  intact Skin:  Diffused erythematous maculopapular/erythroderma with (non full thickness)  desquamation mostly on upper trunk and bilateral upper arms.   Right index finger with open wound wrap in bandage.  Skin bx on back without any purulent drainage with stitch in place. No significant ulceration or blisters concerning for SJS   LABS:  Recent Labs Lab 09/08/12 1447 09/08/12 2010 09/08/12 2201 09/09/12 1205 09/09/12 1337 09/09/12 1347  HGB 14.5 15.4  --   --   --  12.5*  WBC 3.3* 2.8*  --   --   --  2.8*  PLT 188 196  --   --   --  178  NA 122*  --   --   --   --  131*  K 4.2  --   --   --   --  4.1  CL 86*  --   --   --   --  99  CO2 24  --   --   --   --  23  GLUCOSE 425*  --   --   --   --  197*  BUN 19  --   --   --   --  13  CREATININE 1.14  --   --   --   --  0.77  CALCIUM 8.1*  --   --   --   --  7.1*  AST 119*  --   --   --   --  126*  ALT 62*  --   --   --   --  72*  ALKPHOS 91  --   --   --   --  72  BILITOT 0.4  --   --   --   --  0.2*  PROT 6.6  --   --   --   --  6.0  ALBUMIN 3.0*  --   --   --   --  2.4*  APTT  --  36  --   --   --   --   INR  --  1.05  --   --   --   --   LATICACIDVEN  --   --  1.9 2.2  --   --   PHART  --   --   --   --  7.381  --   PCO2ART  --   --   --   --  36.0  --   PO2ART  --   --   --   --  75.3*  --     Recent Labs Lab 09/08/12 2252 09/09/12 0644 09/09/12 1217 09/09/12 1656 09/09/12 2158  GLUCAP 404* 163* 189* 73 154*    CXR: 09/10/12>>>B/L diffuse infiltrates/edema.  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 Interstitial process- edema vs.infectious. Likely ALI. P:   -Continue O2 supplementation with [place].  -May need  intubation if he develops resp distress -May need diuretics if BP tolerates  CARDIOVASCULAR A: Hypotension probably due to possible bacteremia. MAP >60. P:  - No more fluid boluses. - Not on pressors. - Will need central access if clinical worsening. - 2D echo- cardiac enlargement per CXR and shock.  RENAL A:  Hyponatremia likely hypovolemia--improving 131<122. P:   -trend BMP- pending blood draw.  GASTROINTESTINAL A:  transaminitis ?due to shock, DRESS? P:   -trend LFTs - pending blood draw. -supportive treatments  HEMATOLOGIC A:  Neutropenia 2/2 infection vs linezolid vs SJS/TEN  P:  -trend CBC - HIV pending blood draw.  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  Wound ctx on 4/14 showed MRSA.  Punch Skin bx 09/08/12 is pending  P:   -Continue Zyvox (although need to monitor his CBC) -Avoid offending meds such as antibacterial sulfonamides, NSAIDS -supportive treatments -Solumedrol 50 q6 hr. - Cont anti-histamines- benadryl 50 mg q6 hr. -May need to transfer to [place] center if rapidly progress -HIV, PCT pending blood draw.  ENDOCRINE A:  Uncontrolled DM with HbA1c >14.   P:   -SSI  NEUROLOGIC A:  Flat affect.  Oriented. Mother reports hx of seizure disorder as a child which may have damage his brain.   P:   -will monitor  Pt s family updated at bedside   TODAY'S SUMMARY: Patient with skin rash with allergic reaction d/t sulfa. No evidence for [employer] syndrome.  The Pt  agrees to stay with minimal lab draws.   PATEL,RAVI, MD 9:10 AM  .    I have personally obtained a history,  examined the patient, evaluated laboratory and imaging results, formulated the assessment and plan and placed orders. CRITICAL CARE: The patient is critically ill with multiple organ systems failure and requires high complexity decision making for  assessment and support, frequent evaluation and titration of therapies, application of advanced monitoring technologies and extensive interpretation of multiple databases. Critical Care Time devoted to patient care services described in this note is 40 minutes.   Mariel Sleet Beeper  [phone]  Cell  [phone]  If no response or cell goes to voicemail, call beeper [phone]  Pulmonary and [place]  09/10/2012, 8:54 AM

## 2012-09-10 NOTE — Progress Notes (Deleted)
Pt transferred to [id number] with no complications.

## 2012-09-10 NOTE — Progress Notes (Signed)
BRONSYN HARROWER [id number] Code Status: full   Admission Data: 09/10/2012 8:32 PM Attending Provider:  Joya Gaskins PCP:No PCP Per Patient Consults/ Treatment Team: Treatment Team:  M[facility] Pccm, MD  [name] is a 42 y.o. male patient admitted from ED awake, alert - oriented  X 3 - no acute distress noted.  VSS - Blood pressure 109/73, pulse 84, temperature 98.7 F (37.1 C), temperature source Oral, resp. rate 20, height 5' 7.5" (1.715 m), weight 74.5 kg (164 lb 3.9 oz), SpO2 94.00%.  no c/o shortness of breath, no c/o chest pain. Cardiac tele # [phone], in place, cardiac monitor yields:normal sinus rhythm.  IV Fluids:  IV in place, occlusive dsg intact without redness, IV cath forearm right, condition patent and no redness none.  Allergies:   Allergies  Allergen Reactions  . Sulfa Antibiotics Rash    Severe rash. Do not give sulfa medications  . Bactrim (Sulfamethoxazole W-Trimethoprim)     Rash     Past Medical History  Diagnosis Date  . Diabetes mellitus type II, uncontrolled 07/17/2006  . ESSENTIAL HYPERTENSION 07/17/2006  . HYPERLIPIDEMIA 11/24/2008  . Pneumonia 10/22/2011   Medications Prior to Admission  Medication Sig Dispense Refill  . cephALEXin (KEFLEX) 500 MG capsule Take 1 capsule (500 mg total) by mouth 4 (four) times daily.  40 capsule  0  . insulin aspart protamine-insulin aspart (NOVOLOG MIX 70/30) (70-30) 100 UNIT/ML injection Inject 30 Units into the skin 2 (two) times daily with a meal.  10 mL  0  . sulfamethoxazole-trimethoprim (BACTRIM DS) 800-160 MG per tablet Take 1 tablet by mouth 2 (two) times daily. Take for 10 days. 09/03/12       History:  obtained from the patient. Tobacco/alcohol: denied none  Orientation to room, and floor completed with information packet given to patient/family.  Patient declined safety video at this time.  Admission INP armband ID verified with patient/family, and in place.   SR up x 2, fall assessment complete, with patient and family  able to verbalize understanding of risk associated with falls, and verbalized understanding to call nsg before up out of bed.  Call light within reach, patient able to voice, and demonstrate understanding.  Skin, clean-dry- intact without evidence of bruising, or skin tears.   No evidence of skin break down noted on exam.     Will cont to eval and treat per MD orders.  Gracy Bruins, RN 09/10/2012 8:32 PM

## 2012-09-10 NOTE — Progress Notes (Signed)
Called Dr. Aundra Dubin about pt's BP 77/42, orders being carried out.

## 2012-09-10 NOTE — Progress Notes (Signed)
Case discussed with Dr. Michail Sermon at the time of the visit. We reviewed the resident's history and exam and pertinent patient test results. I agree with the assessment, diagnosis and plan of care documented in the resident's note.  [name] is acutely ill and requires admission for intravenous antibiotics, fluid resuscitation, and further evaluation.

## 2012-09-10 NOTE — Progress Notes (Signed)
Clinical Social Worker received referral from RN indicating pt may require assistance with family dynamics-pt is still legally married but wife reportedly has a restraining order against pt.  Pt currently has capacity.  CSW attempted to meet with pt at bedside.  Pt appears to be sleeping and awoke briefly and politely asked this CSW to return after he has had a chance to rest.  CSW updated RN.  CSW to continue to follow and assist as needed.    Dala Dock, MSW, [place]

## 2012-09-10 NOTE — Progress Notes (Signed)
[facility] PCCM  [name] has been completely uncooperative with our exam this morning.  He will converse with [place] periodically in a normal, angry voice, and has followed some commands.  However he is generally uncooperative and not showing signs of delirium (no inattention, waxing and waning conciousness, etc).  I discussed situation with the patient's mother by phone and explained to her that [name] is critically ill and needs a central line.  Apparently the family feels that he has an undiagnosed mental illness and states that his behavior this evening is typical for him.  His mother is going to come into the hospital to try to convince him to consent to a central line.  Jillyn Hidden PCCM Pager: [phone] Cell: [phone] If no response, call [phone]

## 2012-09-10 NOTE — Progress Notes (Signed)
Responded to patient call light and was informed by patient that he wanted to sign the papers to leave the hospital.  I attempted to explore his feelings about why he wanted to leave and offer support regarding encouraging him to stay while supporting his ability to make his own decisions.  Pt was unable to verbalize why he wanted to go and continued to state he understood what was going on with his current health condition.  His mother and brother were at the bedside and reported they would not sign the papers for him to go home without treatment. I informed the Pt that I would contact the Doctor for him.  Made contact with Dr. Algis Liming and reported the Pt wishes to leave AMA and requested she speak with the Pt.   Richardean Canal, RN

## 2012-09-10 NOTE — H&P (Signed)
PULMONARY  / CRITICAL CARE MEDICINE  Name: [name] MRN: [id number] DOB: 1971-03-21    ADMISSION DATE:  09/08/2012 CONSULTATION DATE:  09/10/12  REFERRING MD :  Dr. Murlean Caller PRIMARY SERVICE: Internal Medicine Teaching Service  CHIEF COMPLAINT:  Hypotension  BRIEF PATIENT DESCRIPTION: 42 yo man admitted for skin infection of his right index finger s/p I&D on 08/31/12. He was prescribed Bactrim and Keflex for possible cellulitis and Xray did not show osteomyelitis.  He became hypotensive with skin desquamation.     SIGNIFICANT EVENTS / STUDIES:  08/31/12>>> I&D of right index 09/08/12>>> punch skin bx of back   LINES / TUBES:   CULTURES: Blood cultures 09/08/12>>> Wound culture 08/31/12>>> Punch skin bx of back 09/08/12>>>  ANTIBIOTICS: 09/08/12 Vanco>>>09/08/12 09/09/12 Zyvox>>>  HISTORY OF PRESENT ILLNESS:  [name] is a 42 yo man with uncontrolled DM, HTN, admitted for skin infection of his right index finger s/p I&D on 08/31/12. Wound culture showed MRSA.  He was prescribed Bactrim and Keflex for possible cellulitis and Xray did not show osteomyelitis.  He became hypotensive with skin desquamation despite 9L of NS fluid resuscitation. PCCM was consulted for further management.      PAST MEDICAL HISTORY :  Past Medical History  Diagnosis Date  . Diabetes mellitus type II, uncontrolled 07/17/2006  . ESSENTIAL HYPERTENSION 07/17/2006  . HYPERLIPIDEMIA 11/24/2008  . Pneumonia 10/22/2011   Past Surgical History  Procedure Laterality Date  . Arm surgery     Prior to Admission medications   Medication Sig Start Date End Date Taking? Authorizing Provider  cephALEXin (KEFLEX) 500 MG capsule Take 1 capsule (500 mg total) by mouth 4 (four) times daily. 09/02/12  Yes Hannah Muthersbaugh, PA-C  insulin aspart protamine-insulin aspart (NOVOLOG MIX 70/30) (70-30) 100 UNIT/ML injection Inject 30 Units into the skin 2 (two) times daily with a meal. 09/07/12  Yes Larene Pickett, PA-C   sulfamethoxazole-trimethoprim (BACTRIM DS) 800-160 MG per tablet Take 1 tablet by mouth 2 (two) times daily. Take for 10 days. 09/03/12   Yes Historical Provider, MD   Allergies  Allergen Reactions  . Bactrim (Sulfamethoxazole W-Trimethoprim)     Rash    FAMILY HISTORY:  Family History  Problem Relation Age of Onset  . Hypertension Mother 79  . Cancer Mother     bladder cancer  . Alcohol abuse Father   . Hypertension Brother    SOCIAL HISTORY:  reports that he has never smoked. He has never used smokeless tobacco. He reports that he does not drink alcohol or use illicit drugs.  REVIEW OF SYSTEMS:  Refused to talk  SUBJECTIVE: patient does not want to be "bothered because it is 3AM".  VITAL SIGNS: Temp:  [98.2 F (36.8 C)-99.9 F (37.7 C)] 99.4 F (37.4 C) (04/24 0308) Pulse Rate:  [72-113] 91 (04/24 0350) Resp:  [11-22] 21 (04/24 0350) BP: (77-115)/(42-81) 99/57 mmHg (04/24 0350) SpO2:  [87 %-100 %] 96 % (04/24 0350) HEMODYNAMICS:   VENTILATOR SETTINGS:   INTAKE / OUTPUT: Intake/Output     04/23 0701 - 04/24 0700   I.V. (mL/kg) 4255 (64.4)   IV Piggyback 2150   Total Intake(mL/kg) 6405 (97)   Urine (mL/kg/hr) 1400 (0.9)   Total Output 1400   Net +5005       Stool Occurrence 2 x     PHYSICAL EXAMINATION: General:  Somnolent but able to follow commands. Patient refused to talk Neuro:  Oriented to time, does not cooperate with exam HEENT:  Would  not open eyes or mouth for examination.  Cardiovascular:  S1S2, no m/g/r Lungs:  +crackles bilaterally, no wheezes. Poor effort Abdomen:  Soft non-tender, +BS Musculoskeletal:  intact Skin:  Diffused erythematous maculopapular/erythroderma with desquamation mostly on upper trunk and bilateral upper arms.   Right index finger with open wound wrap in bandage.  Skin bx on back without any purulent drainage with stitch in place.    LABS:  Recent Labs Lab 09/08/12 1447 09/08/12 2010 09/08/12 2201 09/09/12 1205  09/09/12 1337 09/09/12 1347  HGB 14.5 15.4  --   --   --  12.5*  WBC 3.3* 2.8*  --   --   --  2.8*  PLT 188 196  --   --   --  178  NA 122*  --   --   --   --  131*  K 4.2  --   --   --   --  4.1  CL 86*  --   --   --   --  99  CO2 24  --   --   --   --  23  GLUCOSE 425*  --   --   --   --  197*  BUN 19  --   --   --   --  13  CREATININE 1.14  --   --   --   --  0.77  CALCIUM 8.1*  --   --   --   --  7.1*  AST 119*  --   --   --   --  126*  ALT 62*  --   --   --   --  72*  ALKPHOS 91  --   --   --   --  72  BILITOT 0.4  --   --   --   --  0.2*  PROT 6.6  --   --   --   --  6.0  ALBUMIN 3.0*  --   --   --   --  2.4*  APTT  --  36  --   --   --   --   INR  --  1.05  --   --   --   --   LATICACIDVEN  --   --  1.9 2.2  --   --   PHART  --   --   --   --  7.381  --   PCO2ART  --   --   --   --  36.0  --   PO2ART  --   --   --   --  75.3*  --     Recent Labs Lab 09/08/12 2252 09/09/12 0644 09/09/12 1217 09/09/12 1656 09/09/12 2158  GLUCAP 404* 163* 189* 73 154*    CXR: 09/10/12>>>pending  ASSESSMENT / PLAN:  PULMONARY A: Mild Hypoxemia likely due to volume overload from fluid resuscitation.  Currently satting 95% on 3L 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  Wound ctx on 4/14 showed MRSA.  Punch Skin bx 09/08/12 is pending P:   -Continue Zyvox (although need to monitor his CBC) -Avoid offending meds such as antibacterial sulfonamides, NSAIDS -supportive treatments -Systemic steroids/IVIG treatments can be considered if no improvement although data are conflicting -May need to transfer to [place] center if rapidly progress -HIV pending  ENDOCRINE A:  Uncontrolled DM with HbA1c >14.   P:   -SSI -NPH   NEUROLOGIC A:  Flat affect.  Oriented but refused to cooperate with exam. Mother reports hx of seizure disorder as a child which may have damage his brain.   P:   -will monitor  TODAY'S SUMMARY: Patient with skin rash, desquamation, and now hypotension.  Plan for central line once family consents.  Will need vasopressors.  Check chest Xray.  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  Blood cultures and skin bx pending. Discussed with patient's mother and she is on her way to give consent.   Attending:  I have seen and examined the patient with nurse practitioner/resident and agree with the note above.    I have personally obtained a history, examined the patient, evaluated laboratory and imaging results, formulated the assessment and plan and placed orders. CRITICAL CARE: The patient is critically ill with multiple organ systems failure and requires high complexity decision making for assessment and support, frequent evaluation and titration of therapies, application of advanced monitoring technologies and extensive interpretation of multiple databases. Critical Care Time devoted to patient care services described in this note is 60 minutes.   Lemmie Evens Pulmonary and [place] Pager: [phone]  09/10/2012, 4:28 AM

## 2012-09-10 NOTE — Progress Notes (Signed)
Pt has Med-surg bed transfer order.  Removed from ECG monitor and placed on standby.   Richardean Canal, RN

## 2012-09-10 NOTE — Progress Notes (Addendum)
  Echocardiogram 2D Echocardiogram has been performed.  [name] A 09/10/2012, 3:36 PM

## 2012-09-10 NOTE — Progress Notes (Addendum)
Called PCCM for consult per primary team. Pt had total 8L NS boluses plus 200 cc/hr. BP 77/42 (49) and 77/45 (51). Ordered another 1L bolus. Likely sepsis with wound culture +MRSA 4/14, pending blood cultures. Concern for septic shock since BP intermittently low and does not seem to be responding to [facility]  [place] MD [phone]

## 2012-09-10 NOTE — Progress Notes (Signed)
Encouraged Pt to wear oxygen as oxygen saturation in mid to low 80's with a great wave form.  Pt refused and stated he was going home. Pt did allow me to place him back on his oxygen.  He did however refuse to allow me to check his blood glucose and temperature.    Richardean Canal

## 2012-09-10 NOTE — Progress Notes (Signed)
Spoke at length with the patient, his mother and his brother about the need for lab work and the need for a CVC. Patient is continuing to refuse treatment at this time. MD's aware.

## 2012-09-10 NOTE — Progress Notes (Addendum)
[facility]  Day # 2 zyvox  Subjective: Co not being able to sleep   Antibiotics:  Anti-infectives   Start     Dose/Rate Route Frequency Ordered Stop   09/09/12 2200  linezolid (ZYVOX) tablet 600 mg     600 mg Oral Every 12 hours 09/09/12 1657     09/08/12 2200  clindamycin (CLEOCIN) IVPB 600 mg  Status:  Discontinued     600 mg 100 mL/hr over 30 Minutes Intravenous 3 times per day 09/08/12 2128 09/09/12 1657   09/08/12 2000  vancomycin (VANCOCIN) IVPB 1000 mg/200 mL premix  Status:  Discontinued     1,000 mg 200 mL/hr over 60 Minutes Intravenous Every 12 hours 09/08/12 1937 09/10/12 0446      Medications: Scheduled Meds: . diphenhydrAMINE  50 mg Intravenous Q6H  . enoxaparin (LOVENOX) injection  40 mg Subcutaneous Q24H  . famotidine  40 mg Oral BID  . insulin aspart  0-9 Units Subcutaneous TID WC  . insulin NPH  20 Units Subcutaneous QAC breakfast  . linezolid  600 mg Oral Q12H  . methylPREDNISolone (SOLU-MEDROL) injection  40 mg Intravenous Q6H   Continuous Infusions:  PRN Meds:.ondansetron (ZOFRAN) IV, ondansetron, oxyCODONE   Objective: Weight change: 18 lb 10.3 oz (8.456 kg)  Intake/Output Summary (Last 24 hours) at 09/10/12 0956 Last data filed at 09/10/12 0900  Gross per 24 hour  Intake   6705 ml  Output   1400 ml  Net   5305 ml   Blood pressure 88/54, pulse 80, temperature 99.4 F (37.4 C), temperature source Oral, resp. rate 18, height 5' 7.5" (1.715 m), weight 164 lb 3.9 oz (74.5 kg), SpO2 88.00%. Temp:  [99 F (37.2 C)-99.9 F (37.7 C)] 99.4 F (37.4 C) (04/24 0308) Pulse Rate:  [80-113] 80 (04/24 0800) Resp:  [4-24] 18 (04/24 0800) BP: (77-117)/(42-85) 88/54 mmHg (04/24 0800) SpO2:  [87 %-100 %] 88 % (04/24 0800) Weight:  [164 lb 3.9 oz (74.5 kg)] 164 lb 3.9 oz (74.5 kg) (04/24 0530)  Physical Exam: General: sleepy buried in covers but arousable and awake, oriented x3, not in any acute distress.  HEENT: anicteric  sclera, eyelid edema EOMI, oropharynx clear and without exudate  CVS regular rate, normal r, no murmur rubs or gallops  Chest: dim breat sounds at bases Abdomen: soft nontender, nondistended, normal bowel sounds,  Extremities: no clubbing or edema noted bilaterally  Skin: diffuse erythroderma prominent on face, chest, back with some exfoliation on back  And front of chest Finger with open wound without overt pus but tenderness about the site. Hand itself and the arm are nontender  Neuro: nonfocal, strength and sensation intact   Lab Results:  Recent Labs  09/08/12 2010 09/09/12 1347  WBC 2.8* 2.8*  HGB 15.4 12.5*  HCT 41.6 34.8*  PLT 196 178    BMET  Recent Labs  09/08/12 1447 09/09/12 1347  NA 122* 131*  K 4.2 4.1  CL 86* 99  CO2 24 23  GLUCOSE 425* 197*  BUN 19 13  CREATININE 1.14 0.77  CALCIUM 8.1* 7.1*    Micro Results: Recent Results (from the past 240 hour(s))  WOUND CULTURE     Status: None   Collection Time    08/31/12 10:35 PM      Result Value Range Status   Specimen Description WOUND RIGHT FINGER   Final   Special Requests NONE   Final   Gram Stain  Final   Value: RARE WBC PRESENT,BOTH PMN AND MONONUCLEAR     RARE SQUAMOUS EPITHELIAL CELLS PRESENT     NO ORGANISMS SEEN   Culture     Final   Value: MODERATE METHICILLIN RESISTANT STAPHYLOCOCCUS AUREUS     Note: CRITICAL RESULT CALLED TO, READ BACK BY AND VERIFIED WITH: PATTY BY INGRAM A 11/17 10AM   Report Status 09/03/2012 FINAL   Final   Organism ID, Bacteria METHICILLIN RESISTANT STAPHYLOCOCCUS AUREUS   Final  CULTURE, BLOOD (ROUTINE X 2)     Status: None   Collection Time    09/08/12  7:50 PM      Result Value Range Status   Specimen Description BLOOD ARM RIGHT   Final   Special Requests BOTTLES DRAWN AEROBIC AND ANAEROBIC 10CC   Final   Culture  Setup Time 09/09/2012 03:10   Final   Culture     Final   Value:        BLOOD CULTURE RECEIVED NO GROWTH TO DATE CULTURE WILL BE HELD FOR 5 DAYS  BEFORE ISSUING A FINAL NEGATIVE REPORT   Report Status PENDING   Incomplete  CULTURE, BLOOD (ROUTINE X 2)     Status: None   Collection Time    09/08/12  8:10 PM      Result Value Range Status   Specimen Description BLOOD ARM LEFT   Final   Special Requests BOTTLES DRAWN AEROBIC AND ANAEROBIC 10CC   Final   Culture  Setup Time 09/09/2012 03:09   Final   Culture     Final   Value:        BLOOD CULTURE RECEIVED NO GROWTH TO DATE CULTURE WILL BE HELD FOR 5 DAYS BEFORE ISSUING A FINAL NEGATIVE REPORT   Report Status PENDING   Incomplete  MRSA PCR SCREENING     Status: None   Collection Time    09/09/12 11:59 AM      Result Value Range Status   MRSA by PCR NEGATIVE  NEGATIVE Final   Comment:            The GeneXpert MRSA Assay (FDA     approved for NASAL specimens     only), is one component of a     comprehensive MRSA colonization     surveillance program. It is not     intended to diagnose MRSA     infection nor to guide or     monitor treatment for     MRSA infections.    Studies/Results: Dg Chest Port 1 View  09/10/2012  *RADIOLOGY REPORT*  Clinical Data: Shortness of breath, hypotension  PORTABLE CHEST - 1 VIEW  Comparison: 10/23/2011  Findings: Cardiomegaly.  Central vascular congestion.  Perihilar interstitial and ground-glass airspace opacities, right greater than left.  Small left effusion not excluded.  No pneumothorax.  No acute osseous finding.  IMPRESSION: Perihilar interstitial and ground-glass airspace opacities, may reflect pulmonary edema or an atypical infection.  Also consider opportunistic infection such as PCP pneumonia if the patient is immunocompromised.   Original Report Authenticated By: Carlos Levering, M.D.       Assessment/Plan: [name] is a 42 y.o. male with  DM, HTN and MRSA hand infection given bactrim , then keflex (why keflex with mRSA?) now with persistent fevers, erythroderma, leukopenia, hypotension ,now diffuse infiltrates  #1 Fevers:  concern must be obviously for deeper infection, in hand, or possibly even blood stream infection. Certainly he could also have a S.  Aureus toxin mediated pathology going on. Finally his fevers could also be part of presentation of ADR (latter would be most unifying diagnosis    --I would continue zyvox (will have same inhibition of ribosomal protein synthesis as clinda with less risk for CDI  UNLESS he develops TTpenia or he has worsening leukopenia (note leukopenia preceded zyvox) if this were to occur could change back to vanco and clnda  -- supportive therapy  --I would consider imaging the hand with MRI, though Dr Caralyn Guile is not suspicious for deep infection in the hand based on the exam  --fu blood cultures closely   #2 Erthroderma" agree with differential of ADR to sulfa, beta lactam vs toxin mediated pathology with bandaged finger, vs erythroderma from severe Staph aueus infection   #3 leukopenia: likely secondary to his systemic illness whether this be due to aDr or sever infection   #4 Screening: would check HIV   #5 Infiltrates on CXR: I agree that ALI from ADR seems reasonable unifying dx      LOS: 2 days   Alcide Evener 09/10/2012, 9:56 AM

## 2012-09-11 ENCOUNTER — Inpatient Hospital Stay (HOSPITAL_COMMUNITY): Payer: MEDICAID

## 2012-09-11 DIAGNOSIS — T7840XA Allergy, unspecified, initial encounter: Secondary | ICD-10-CM | POA: Diagnosis present

## 2012-09-11 DIAGNOSIS — L270 Generalized skin eruption due to drugs and medicaments taken internally: Principal | ICD-10-CM

## 2012-09-11 LAB — GLUCOSE, CAPILLARY
Glucose-Capillary: 208 mg/dL — ABNORMAL HIGH (ref 70–99)
Glucose-Capillary: 286 mg/dL — ABNORMAL HIGH (ref 70–99)

## 2012-09-11 MED ORDER — DOXYCYCLINE HYCLATE 100 MG PO CAPS: 100.0000 mg | ORAL_CAPSULE | Freq: Two times a day (BID) | ORAL | Status: DC

## 2012-09-11 MED ORDER — INSULIN ASPART PROT & ASPART (70-30 MIX) 100 UNIT/ML ~~LOC~~ SUSP: 30.0000 [IU] | Freq: Two times a day (BID) | SUBCUTANEOUS | Status: DC

## 2012-09-11 NOTE — Progress Notes (Signed)
[facility]  Day # 3 zyvox  Subjective: No new complaints  Antibiotics:  Anti-infectives   Start     Dose/Rate Route Frequency Ordered Stop   09/09/12 2200  linezolid (ZYVOX) tablet 600 mg     600 mg Oral Every 12 hours 09/09/12 1657     09/08/12 2200  clindamycin (CLEOCIN) IVPB 600 mg  Status:  Discontinued     600 mg 100 mL/hr over 30 Minutes Intravenous 3 times per day 09/08/12 2128 09/09/12 1657   09/08/12 2000  vancomycin (VANCOCIN) IVPB 1000 mg/200 mL premix  Status:  Discontinued     1,000 mg 200 mL/hr over 60 Minutes Intravenous Every 12 hours 09/08/12 1937 09/10/12 0446      Medications: Scheduled Meds: . diphenhydrAMINE  25 mg Oral Q6H  . enoxaparin (LOVENOX) injection  40 mg Subcutaneous Q24H  . famotidine  40 mg Oral BID  . insulin aspart  0-9 Units Subcutaneous TID WC  . insulin NPH  20 Units Subcutaneous QAC breakfast  . linezolid  600 mg Oral Q12H  . methylPREDNISolone (SOLU-MEDROL) injection  40 mg Intravenous Q12H   Continuous Infusions:  PRN Meds:.ondansetron (ZOFRAN) IV, ondansetron, oxyCODONE   Objective: Weight change: -1 lb 15.7 oz (-0.9 kg)  Intake/Output Summary (Last 24 hours) at 09/11/12 1123 Last data filed at 09/10/12 1500  Gross per 24 hour  Intake    520 ml  Output    600 ml  Net    -80 ml   Blood pressure 137/86, pulse 89, temperature 99.2 F (37.3 C), temperature source Oral, resp. rate 20, height 5\' 7"  (1.702 m), weight 162 lb 4.1 oz (73.6 kg), SpO2 90.00%. Temp:  [98.7 F (37.1 C)-99.2 F (37.3 C)] 99.2 F (37.3 C) (04/24 2049) Pulse Rate:  [79-106] 89 (04/24 2049) Resp:  [10-27] 20 (04/24 2049) BP: (91-137)/(53-86) 137/86 mmHg (04/24 2049) SpO2:  [89 %-95 %] 90 % (04/24 2049) Weight:  [162 lb 4.1 oz (73.6 kg)] 162 lb 4.1 oz (73.6 kg) (04/24 2049)  Physical Exam: General: sleepy  But arousable Rash improving, stitches still in place  Lab Results:  Recent Labs  09/09/12 1347 09/10/12 0936   WBC 2.8* 4.4  HGB 12.5* 11.3*  HCT 34.8* 32.6*  PLT 178 176    BMET  Recent Labs  09/09/12 1347 09/10/12 0936  NA 131* 136  K 4.1 3.4*  CL 99 106  CO2 23 21  GLUCOSE 197* 124*  BUN 13 5*  CREATININE 0.77 0.71  CALCIUM 7.1* 6.7*    Micro Results: Recent Results (from the past 240 hour(s))  CULTURE, BLOOD (ROUTINE X 2)     Status: None   Collection Time    09/08/12  7:50 PM      Result Value Range Status   Specimen Description BLOOD ARM RIGHT   Final   Special Requests BOTTLES DRAWN AEROBIC AND ANAEROBIC 10CC   Final   Culture  Setup Time 09/09/2012 03:10   Final   Culture     Final   Value:        BLOOD CULTURE RECEIVED NO GROWTH TO DATE CULTURE WILL BE HELD FOR 5 DAYS BEFORE ISSUING A FINAL NEGATIVE REPORT   Report Status PENDING   Incomplete  CULTURE, BLOOD (ROUTINE X 2)     Status: None   Collection Time    09/08/12  8:10 PM      Result Value Range Status   Specimen Description BLOOD ARM LEFT  Final   Special Requests BOTTLES DRAWN AEROBIC AND ANAEROBIC 10CC   Final   Culture  Setup Time 09/09/2012 03:09   Final   Culture     Final   Value:        BLOOD CULTURE RECEIVED NO GROWTH TO DATE CULTURE WILL BE HELD FOR 5 DAYS BEFORE ISSUING A FINAL NEGATIVE REPORT   Report Status PENDING   Incomplete  MRSA PCR SCREENING     Status: None   Collection Time    09/09/12 11:59 AM      Result Value Range Status   MRSA by PCR NEGATIVE  NEGATIVE Final   Comment:            The GeneXpert MRSA Assay (FDA     approved for NASAL specimens     only), is one component of a     comprehensive MRSA colonization     surveillance program. It is not     intended to diagnose MRSA     infection nor to guide or     monitor treatment for     MRSA infections.    Studies/Results: Mr Hand Right W Wo Contrast  09/11/2012  *RADIOLOGY REPORT*  Clinical Data: Right finger abscess status post I&D.  Fevers and hypertension.  Evaluate for abscess.  MRI OF THE RIGHT HAND WITHOUT AND WITH  CONTRAST  Technique:  Multiplanar, multisequence MR imaging was performed both before and after administration of intravenous contrast.  Contrast: 68mL MULTIHANCE GADOBENATE DIMEGLUMINE 529 MG/ML IV SOLN  Comparison: Radiographs 09/07/2012.  Findings: There is inflammation/edema involving the subcutaneous tissues surrounding the index finger.  There is a small focal wound on the palmar aspect of the finger at the level of the mid middle phalanx.  No discrete abscess is identified.  There is also mild cellulitis involving both the dorsal and ventral aspects of the hand with some fluid and edema surrounding the flexor tendons but no worrisome enhancement to suggest septic tenosynovitis.  No findings for septic arthritis or osteomyelitis.  IMPRESSION:  1.  There is cellulitis but no focal soft tissue abscess. 2.  No findings for myofasciitis, septic arthritis or osteomyelitis.   Original Report Authenticated By: Marijo Sanes, M.D.    Dg Chest Port 1 View  09/11/2012  *RADIOLOGY REPORT*  Clinical Data: Bilateral infiltrates.  PORTABLE CHEST - 1 VIEW  Comparison: September 10, 2012.  Findings: Stable mild cardiomegaly.  Central pulmonary vascular congestion is unchanged with probable bilateral perihilar and basilar edema.  Small right pleural effusion is noted which is new since prior exam.  Bony thorax is intact.  IMPRESSION: Central pulmonary vascular congestion is noted with bilateral perihilar and basilar edema with a new mild right pleural effusion.   Original Report Authenticated By: Marijo Conception.,  M.D.    Dg Chest Port 1 View  09/10/2012  *RADIOLOGY REPORT*  Clinical Data: Shortness of breath, hypotension  PORTABLE CHEST - 1 VIEW  Comparison: 10/23/2011  Findings: Cardiomegaly.  Central vascular congestion.  Perihilar interstitial and ground-glass airspace opacities, right greater than left.  Small left effusion not excluded.  No pneumothorax.  No acute osseous finding.  IMPRESSION: Perihilar interstitial and  ground-glass airspace opacities, may reflect pulmonary edema or an atypical infection.  Also consider opportunistic infection such as PCP pneumonia if the patient is immunocompromised.   Original Report Authenticated By: Carlos Levering, M.D.       Assessment/Plan: [name] is a 42 y.o. male with  DM, HTN  and MRSA hand infection given bactrim , then keflex (why keflex with mRSA?) now with persistent fevers, erythroderma, leukopenia, hypotension ,now diffuse infiltrates. His bx came back positive for an interface dermatitis c/w allergic rxn. Fungal hyphae were also seen of? sig  #1 Allergic rxn, likely to sulfa but would also code keflex since both were given --supportive care  #2 MRSA skin infection: dc on doxycycline  #4 Screening: HIV neg       LOS: 3 days   Alcide Evener 09/11/2012, 11:23 AM

## 2012-09-11 NOTE — Care Management Note (Signed)
    Page 1 of 1   09/11/2012     3:07:49 PM   CARE MANAGEMENT NOTE 09/11/2012  Patient:  [name], [name]   Account Number:  [id number]  Date Initiated:  09/10/2012  Documentation initiated by:  Luz Lex  Subjective/Objective Assessment:   Sepsis - tx from SDU.     Action/Plan:   Anticipated DC Date:  09/11/2012   Anticipated DC Plan:  [place]  CM consult  [facility]      Choice offered to / List presented to:             Status of service:  Completed, signed off Medicare Important Message given?   (If response is "NO", the following Medicare IM given date fields will be blank) Date Medicare IM given:   Date Additional Medicare IM given:    Discharge Disposition:  HOME/SELF CARE  Per UR Regulation:  Reviewed for med. necessity/level of care/duration of stay  If discussed at [place] of Stay Meetings, dates discussed:    Comments:  Contact:  Ribeiro,Tracey Mother [phone]  09/11/12 14:41 Tomi Bamberger RN, BSN [phone] pt for dc today, patient has transportation but needs ast with meds, ast with [facility]. Pt will follow up with Internal med clinic, does not need idigent clinic follow up.

## 2012-09-11 NOTE — Progress Notes (Signed)
PCCM  Pt stable for d/c to home from PCCM perspective I have asked [facility] to reassume care today as they are pts PCP and plan d/c today and out pt f/u.  Saralyn Pilar WrightMD

## 2012-09-11 NOTE — Progress Notes (Signed)
ICU TRANSFER SUMMARY Patient was transferred from stepdown to the ICU in the early hours of 09/10/12 as he had unresponsive hypotension despite 9L of NS resuscitation and worsening exfoliation of skin rash. Was also noted to have fluid overload on CXR. Refused central line or pressure support.  He was managed conservatively with continuation of linezolid. Fluids held due to volume overload. He was given solumedrol and antihistamines.  Skin biopsy results from admission returned showing interface dermatitis consistent with allergic drug reaction. No evidence of TSS.  MRI R hand showed changes consistent with cellulitis, no evidence of soft tissue abscess, tenosynovitis, or osteomyelitis.  Echo showed normal LV size and systolic function, EF 0000000 and normal diastolic function. He stabilized without any further extension of rash or hypotension. Leukopenia resolved and HIV nonreactive. By time of transfer to floor he refused all vitals monitoring and several nursing interventions. Says he is leaving hospital today.   Subjective: Gives one word grunts for answers. Denies pain. Denies SOB, pain in mouth, eyes, genitals. Denies dizziness. Says he is leaving today.  Objective: Vital signs in last 24 hours: Filed Vitals:   09/10/12 1600 09/10/12 1615 09/10/12 1630 09/10/12 2049  BP: 115/72 114/76 109/73 137/86  Pulse: 81 87 84 89  Temp:    99.2 F (37.3 C)  TempSrc:    Oral  Resp: 26 27 20 20   Height:    5\' 7"  (1.702 m)  Weight:    162 lb 4.1 oz (73.6 kg)  SpO2: 93% 94% 94% 90%   Weight change: -1 lb 15.7 oz (-0.9 kg)  Intake/Output Summary (Last 24 hours) at 09/11/12 1158 Last data filed at 09/10/12 1500  Gross per 24 hour  Intake    520 ml  Output    600 ml  Net    -80 ml  Vitals reviewed General : Well-developed, well-nourished male lying in bed, reluctant participate in history or examination.  HEENT: Dry, cracked lips. Conjunctiva normal, not injected. No erythema/ulceration of oral  mucosa.  CV: Regular rate and rhythm, no murmurs rubs or gallops  Pulmonary: No increased work of breathing, good air movement bilateral lung fields, no wheezes  Abdomen: Soft, non-tender, nondistended with normoactive bowel sounds  Extremities: Incision site from prior I&D overlying palmar surface of R index finger in between PIP and DIP joints without frank pus or significant surrounding erythema.  Feet are warm and well perfused without rash or edema  2+ radial, DP pulses  Skin: Persistent erythroderma and superficial exfoliative scaling of face, neck, shoulders, upper anterior trunk and 2/3 back (20-30% total BSA). Persistent scaling of lips. No involvement of mucous membranes of eyes or mouth. Skin biopsy sites on back show sutures in place with no evidence of infection or bleeding. Neuro: Minimally interactive with [place]. Alert and oriented to person, place, time, situation.    Lab Results: Basic Metabolic Panel:  Recent Labs Lab 09/09/12 1347 09/10/12 0936  NA 131* 136  K 4.1 3.4*  CL 99 106  CO2 23 21  GLUCOSE 197* 124*  BUN 13 5*  CREATININE 0.77 0.71  CALCIUM 7.1* 6.7*   Liver Function Tests:  Recent Labs Lab 09/09/12 1347 09/10/12 0936  AST 126* 72*  ALT 72* 54*  ALKPHOS 72 72  BILITOT 0.2* 0.2*  PROT 6.0 5.3*  ALBUMIN 2.4* 2.2*   CBC:  Recent Labs Lab 09/09/12 1347 09/10/12 0936  WBC 2.8* 4.4  NEUTROABS 1.0* 1.7  HGB 12.5* 11.3*  HCT 34.8* 32.6*  MCV 76.3*  77.4*  PLT 178 176   CBG:  Recent Labs Lab 09/09/12 2158 09/10/12 0845 09/10/12 1214 09/10/12 1614 09/10/12 2141 09/11/12 0751  GLUCAP 154* 78 157* 147* 209* 208*   Hemoglobin A1C:  Recent Labs Lab 09/08/12 1454  HGBA1C >14.0   Coagulation:  Recent Labs Lab 09/08/12 2010  LABPROT 13.6  INR 1.05   Urine Drug Screen: Drugs of Abuse     Component Value Date/Time   LABOPIA NONE DETECTED 06/15/2008 2332   COCAINSCRNUR NONE DETECTED 06/15/2008 2332   LABBENZ NONE DETECTED  06/15/2008 2332   AMPHETMU NONE DETECTED 06/15/2008 2332   THCU NONE DETECTED 06/15/2008 2332   LABBARB  Value: NONE DETECTED        DRUG SCREEN FOR MEDICAL PURPOSES ONLY.  IF CONFIRMATION IS NEEDED FOR ANY PURPOSE, NOTIFY LAB WITHIN 5 DAYS.        LOWEST DETECTABLE LIMITS FOR URINE DRUG SCREEN Drug Class       Cutoff (ng/mL) Amphetamine      1000 Barbiturate      200 Benzodiazepine   [id number] Tricyclics       [id number] Opiates          300 Cocaine          300 THC              50 06/15/2008 2332    Urinalysis:  Recent Labs Lab 09/09/12 0037  COLORURINE YELLOW  LABSPEC 1.034*  PHURINE 6.0  GLUCOSEU >1000*  HGBUR NEGATIVE  BILIRUBINUR NEGATIVE  KETONESUR 15*  PROTEINUR NEGATIVE  UROBILINOGEN 1.0  NITRITE NEGATIVE  LEUKOCYTESUR NEGATIVE   Micro Results: Recent Results (from the past 240 hour(s))  CULTURE, BLOOD (ROUTINE X 2)     Status: None   Collection Time    09/08/12  7:50 PM      Result Value Range Status   Specimen Description BLOOD ARM RIGHT   Final   Special Requests BOTTLES DRAWN AEROBIC AND ANAEROBIC 10CC   Final   Culture  Setup Time 09/09/2012 03:10   Final   Culture     Final   Value:        BLOOD CULTURE RECEIVED NO GROWTH TO DATE CULTURE WILL BE HELD FOR 5 DAYS BEFORE ISSUING A FINAL NEGATIVE REPORT   Report Status PENDING   Incomplete  CULTURE, BLOOD (ROUTINE X 2)     Status: None   Collection Time    09/08/12  8:10 PM      Result Value Range Status   Specimen Description BLOOD ARM LEFT   Final   Special Requests BOTTLES DRAWN AEROBIC AND ANAEROBIC 10CC   Final   Culture  Setup Time 09/09/2012 03:09   Final   Culture     Final   Value:        BLOOD CULTURE RECEIVED NO GROWTH TO DATE CULTURE WILL BE HELD FOR 5 DAYS BEFORE ISSUING A FINAL NEGATIVE REPORT   Report Status PENDING   Incomplete  MRSA PCR SCREENING     Status: None   Collection Time    09/09/12 11:59 AM      Result Value Range Status   MRSA by PCR NEGATIVE  NEGATIVE Final   Comment:            The GeneXpert  MRSA Assay (FDA     approved for NASAL specimens     only), is one component of a     comprehensive MRSA colonization     surveillance program.  It is not     intended to diagnose MRSA     infection nor to guide or     monitor treatment for     MRSA infections.   Studies/Results: Mr Hand Right W Wo Contrast  09/11/2012  *RADIOLOGY REPORT*  Clinical Data: Right finger abscess status post I&D.  Fevers and hypertension.  Evaluate for abscess.  MRI OF THE RIGHT HAND WITHOUT AND WITH CONTRAST  Technique:  Multiplanar, multisequence MR imaging was performed both before and after administration of intravenous contrast.  Contrast: 12mL MULTIHANCE GADOBENATE DIMEGLUMINE 529 MG/ML IV SOLN  Comparison: Radiographs 09/07/2012.  Findings: There is inflammation/edema involving the subcutaneous tissues surrounding the index finger.  There is a small focal wound on the palmar aspect of the finger at the level of the mid middle phalanx.  No discrete abscess is identified.  There is also mild cellulitis involving both the dorsal and ventral aspects of the hand with some fluid and edema surrounding the flexor tendons but no worrisome enhancement to suggest septic tenosynovitis.  No findings for septic arthritis or osteomyelitis.  IMPRESSION:  1.  There is cellulitis but no focal soft tissue abscess. 2.  No findings for myofasciitis, septic arthritis or osteomyelitis.   Original Report Authenticated By: Marijo Sanes, M.D.    Dg Chest Port 1 View  09/11/2012  *RADIOLOGY REPORT*  Clinical Data: Bilateral infiltrates.  PORTABLE CHEST - 1 VIEW  Comparison: September 10, 2012.  Findings: Stable mild cardiomegaly.  Central pulmonary vascular congestion is unchanged with probable bilateral perihilar and basilar edema.  Small right pleural effusion is noted which is new since prior exam.  Bony thorax is intact.  IMPRESSION: Central pulmonary vascular congestion is noted with bilateral perihilar and basilar edema with a new mild right  pleural effusion.   Original Report Authenticated By: Marijo Conception.,  M.D.    Dg Chest Port 1 View  09/10/2012  *RADIOLOGY REPORT*  Clinical Data: Shortness of breath, hypotension  PORTABLE CHEST - 1 VIEW  Comparison: 10/23/2011  Findings: Cardiomegaly.  Central vascular congestion.  Perihilar interstitial and ground-glass airspace opacities, right greater than left.  Small left effusion not excluded.  No pneumothorax.  No acute osseous finding.  IMPRESSION: Perihilar interstitial and ground-glass airspace opacities, may reflect pulmonary edema or an atypical infection.  Also consider opportunistic infection such as PCP pneumonia if the patient is immunocompromised.   Original Report Authenticated By: Carlos Levering, M.D.    Medications: I have reviewed the patient's current medications. Scheduled Meds: . diphenhydrAMINE  25 mg Oral Q6H  . enoxaparin (LOVENOX) injection  40 mg Subcutaneous Q24H  . famotidine  40 mg Oral BID  . insulin aspart  0-9 Units Subcutaneous TID WC  . insulin NPH  20 Units Subcutaneous QAC breakfast  . linezolid  600 mg Oral Q12H  . 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 Punch biopsy results show interface dermatitis c/w allergic drug reaction. Patient's hypotension resolved, now with some exfoliation over site of erythroderma but no extension of rash.  Fevers, leukopenia resolved. Transaminases downtrending.  Likely due to bactrim but cannot count out keflex as started at the same time.   2) Poorly healing MRSA abscess of R index finger  MRI R hand shows cellulitis but no tenosynovitis, abscess or osteomyelitis. Prior culture showed MRSA sensitive to doxycycline. Discussed w Dr.  [employer]. Will d/c on BID doxycycline.    3) Type II diabetes mellitus, uncontrolled  He  has not been taking his NPH insulin as an outpatient. His last hbA1c was greater than 14, and his blood sugar here on admission was 436.  Glucose this am  208.  -NPH 20 units twice a day and sliding scale insulin   Dispo: today  The patient does have a current PCP ([facility]), therefore will be requiring [facility] follow-up after discharge.   The patient does not have transportation limitations that hinder transportation to clinic appointments.  .Services Needed at time of discharge: Y = Yes, Blank = No PT:   OT:   RN:   Equipment:   Other:     LOS: 3 days   Tonia Brooms 09/11/2012, 11:58 AM

## 2012-09-11 NOTE — Progress Notes (Signed)
INTERNAL MEDICINE TEACHING SERVICE Attending Note  Date: 09/11/2012  Patient name: [name]  Medical record number: [id number]  Date of birth: Aug 12, 1970    This patient has been seen and discussed with the house staff. Please see their note for complete details. I concur with their findings with the following additions/corrections: Refusing vital signs at times. He was refusing medications in my presence this morning.  He was reluctant to speak with me stating he was too tired and covering himself with his sheet. He was awake, alert, oriented to person, place, time, situation. He is noted to have exfoliation on his upper chest.  Dermatopathology has returned, likely drug reaction, would code bactrim and keflex in his allergies as both were given near the same time. Right index finger with cellulitis, slowly healing, with MRSA infection. MRI without evidence of deeper infection.  Agree with continuing course of doxycycline per ID. I don't think there is a need to continue steroids as an outpatient.   He needs to adhere to insulin therapy, he understands. Hemodynamically stable, reviewed prior vital signs. I do not hear any crackles on lung exam, but he did have some venous congestion/pulmonary edema due to needed aggressive fluid resuscitation. With his normal EF and normal renal function, this should finish resolving on its own without need for diuretics. He is medically stable for discharge.  The treatment plan was discussed in detail with the patient.  Alternatives to treatment, side effects, risks and benefits, and complications were discussed with the patient. Informed consent was obtained. The patient agrees to proceed with the current treatment plan.  Dominic Pea, DO  09/11/2012, 12:43 PM

## 2012-09-11 NOTE — Discharge Summary (Signed)
Internal [facility] Discharge Note  Name: [name] MRN: [id number] DOB: 1971-02-11 42 y.o.  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 One po bid x 7 days     insulin aspart protamine- aspart (70-30) 100 UNIT/ML injection  Commonly known as:  NOVOLOG MIX 70/30  Inject 30 Units into the skin 2 (two) times daily with a meal.        Disposition and follow-up:   [name] was discharged from [facility] in [place] condition.  At the hospital follow up visit please address  1) Follow up of severe allergic drug reaction (2/2 bactrim, possibly keflex) Patient with exfoliative erythroderma of nearly 30% of body. Biopsy showed interface dermatitis consistent with allergic drug reaction. Observed and no significant full thickness desquamation or mucosal involvement to suggest SJS.  Please follow up on patient's symptoms, follow up CMet to assure continued downtrend LFTs, CBC to look for continued resolution of leukopenia.  2) R index finger cellulitis Had abscess that is s/p I&D 08/31/12. Repeat MRI on admission showed cellulitis. Discharged on doxycycline to  complete 7 more days abx. Please assess compliance. 3) Suture removal Please remove sutures from skin biopsy sites on R and L upper back. 4) Diabetes Poorly controlled w A1c >14, noncompliance. Please assess hyperglycemia. 5) Some concern by family that patient with "mental issues" No suicidal ideation reported during admission. Consider referral to [facility] for [facility].    Follow-up Appointments: Follow-up Information   Follow up with Santa Lighter, MD On 09/18/2012. (1:15pm)    Contact information:   [address] [place] [address] [phone]      Discharge Orders   Future Appointments Provider Department Dept Phone   09/18/2012 1:15 PM Neta Ehlers, MD [place] [phone]   Future Orders Complete By Expires     Call MD for:  difficulty breathing, headache or visual disturbances  As directed     Call MD for:  persistant nausea and vomiting  As directed     Call MD for:  severe uncontrolled pain  As directed     Call MD for:  temperature >100.4  As directed        Consultations:   PCCM, Dr. Joya Gaskins ID, Dr. Tommy Medal Ortho, Dr. Caralyn Guile  Procedures Performed:  Mr Hand Right W Wo Contrast  09/11/2012  *RADIOLOGY REPORT*  Clinical Data: Right finger abscess status post I&D.  Fevers and hypertension.  Evaluate for abscess.  MRI OF THE RIGHT HAND WITHOUT AND WITH CONTRAST  Technique:  Multiplanar, multisequence MR imaging was performed both before  and after administration of intravenous contrast.  Contrast: 33mL MULTIHANCE GADOBENATE DIMEGLUMINE 529 MG/ML IV SOLN  Comparison: Radiographs 09/07/2012.  Findings: There is inflammation/edema involving the subcutaneous tissues surrounding the index finger.  There is a small focal wound on the palmar aspect of the finger at the level of the mid middle phalanx.  No discrete abscess is identified.  There is also mild cellulitis involving both the dorsal and ventral aspects of the hand with some  fluid and edema surrounding the flexor tendons but no worrisome enhancement to suggest septic tenosynovitis.  No findings for septic arthritis or osteomyelitis.  IMPRESSION:  1.  There is cellulitis but no focal soft tissue abscess. 2.  No findings for myofasciitis, septic arthritis or osteomyelitis.   Original Report Authenticated By: Marijo Sanes, M.D.    Dg Chest Port 1 View  09/11/2012  *RADIOLOGY REPORT*  Clinical Data: Bilateral infiltrates.  PORTABLE CHEST - 1 VIEW  Comparison: September 10, 2012.  Findings: Stable mild cardiomegaly.  Central pulmonary vascular congestion is unchanged with probable bilateral perihilar and basilar edema.  Small right pleural effusion is noted which is new since prior exam.  Bony thorax is intact.  IMPRESSION: Central pulmonary vascular congestion is noted with bilateral perihilar and basilar edema with a new mild right pleural effusion.   Original Report Authenticated By: Marijo Conception.,  M.D.    Dg Chest Port 1 View  09/10/2012  *RADIOLOGY REPORT*  Clinical Data: Shortness of breath, hypotension  PORTABLE CHEST - 1 VIEW  Comparison: 10/23/2011  Findings: Cardiomegaly.  Central vascular congestion.  Perihilar interstitial and ground-glass airspace opacities, right greater than left.  Small left effusion not excluded.  No pneumothorax.  No acute osseous finding.  IMPRESSION: Perihilar interstitial and ground-glass airspace opacities, may reflect pulmonary edema or an atypical infection.  Also consider opportunistic infection such as PCP pneumonia if the patient is immunocompromised.   Original Report Authenticated By: Carlos Levering, M.D.    2D Echo:  Study Conclusions - Left ventricle: The cavity size was normal. Wall thickness was normal. Systolic function was normal. The estimated ejection fraction was in the range of 55% to 60%. Wall motion was normal; there were no regional wall motion abnormalities. Left ventricular diastolic function parameters were normal. -  Aortic valve: There was no stenosis. - Mitral valve: Trivial regurgitation. - Left atrium: The atrium was mildly dilated. - Right ventricle: The cavity size was normal. Systolic function was normal. - Tricuspid valve: Peak RV-RA gradient:44mm Hg (S). - Pulmonary arteries: PA peak pressure: 59mm Hg (S). Impressions: - Normal LV size and systolic function, EF 0000000. Normal diastolic function. Normal RV size and systolic function. Mild left atrial enlargement. No significant valvular abnormalities.  Skin biopsy, 43mm punch x2 1. Skin , right upper back - INTERFACE DERMATITIS, SEE DESCRIPTION - SUPERFICIAL FUNGAL INFECTION 2. Direct Immunofluorescence, left upper back NEGATIVE FOR IMMUNOREACTANTS  Microscopic Description 1. There is a sparse superficial perivascular lymphocytic infiltrate associated with interface vacuolar alteration and scattered necrotic keratinocytes at the dermoepidermal junction and within the spinous zone of the epidermis. There are also fungal hyphae within the cornified layer highlighted by a PAS stain. Overall, these are the findings of an interface dermatitis. The differential diagnosis in this setting includes a drug reaction and an early stage of erythema multiforme (or Kathreen Cosier syndrome). Based on the clinical presentation, a drug reaction is favored. Clinical correlation is recommended. The biopsy also shows a superficial fungal infection (tinea) which maybe incidental. --  2. Direct immunofluorescence studies have been performed using antisera against IgG, IgM, IgA, fibrinogen and C3. These studies are essentially negative with no or only nonspecific staining. The controls stained appropriately.  Admission HPI:  [name] is a 42 yo male w pmh of HTN, HLD, poorly controlled T2DM who presents as a direct admit from the clinic with complaints of worsening right finger pain and drainage from abscess.  [name] first presented to the emergency room on April 14  with complaints of throbbing right index pain. He had no antecedent trauma or skin breakdown. At that time an incision and drainage of the finger was performed without complication. X-ray was negative for any evidence of osteomyelitis. He was discharged with Bactrim. He followed up 2 days later for wound check. At that time, the finger was noted to have minimal cellulitis and cephalexin was added to his antibiotic regimen. He returned to the emergency room again on the April 21 (yesterday) reporting persistent finger pain and some missed antibiotic doses. X-ray was repeated again and showed no evidence of osteomyelitis. He was noted to be febrile to 103. Reported that he could not afford insulin and noted to also be hyperglycemic with blood sugar 386. He was instructed to continue soaks at home as well as Bactrim and cephalexin. He was given a referral to a Copy. Wound cultures grew out MRSA sensitive to bactrim.  Today, the patient's mother called the internal medicine clinic because she was concerned about his health. She reports that he's been feeling sick at home and noncompliant with insulin or other medications. This morning his temperature at home was 102.  He reports continued pain of his right index finger and has difficulty bending his PIP joint due to pain. He has full range of motion of his wrist and other finger joints without pain. He says he's also felt more tired at home. He had some nausea and one episode of nonbloody, nonbilious emesis last night.  He has not personally noted any rashes, pruritis or skin changes.  He denies any abdominal pain, diarrhea, dizziness, headaches, visual changes, chest pain, shortness of breath, cough, dysuria, hematuria, blood in his stool, eye pain, genital pain.  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 Laboratory testing revealed leukopenia and mild transaminitis. This was in the setting of recent treatment of right index finger abscess with both cephalexin and Bactrim about a week ago.  Initially, differential diagnosis included allergic drug reaction, early SJS, DRESS, or staph toxic shock syndrome. Lactic acid was normal on admission. Skin biopsies x2 taken from R and L back. He was started on vancomycin for concern for worsening R finger infection as well as clindamycin for possibly staph toxin-mediated process. He was aggressively hydrated with 9L NS within first 24 hours of hospitalization but became persistently hypotensive and was transferred from stepdown to the ICU in the early hours of 09/10/12 due to unresponsive hypotension and  worsening exfoliation of skin rash. Was also noted to have fluid overload on CXR. Upon arrival in the ICU, he refused central line or pressure support. He was managed conservatively. Abx changed to linezolid. Fluids held due to volume overload. He was given solumedrol and antihistamines.  Skin biopsy results from admission returned showing interface dermatitis consistent with allergic drug reaction. No evidence of TSS. MRI R hand showed changes consistent  with cellulitis, no evidence of soft tissue abscess, tenosynovitis, or osteomyelitis. Echo showed normal LV size and systolic function, EF 0000000 and normal diastolic function.  He stabilized without any further extension of rash or hypotension. Leukopenia resolved and HIV nonreactive. Transaminitis downtrended. By time of transfer to floor, he refused all vitals monitoring and several nursing interventions.  Bactrim/sulfa drugs and cephalexin will be added to drug allergies. Suspect bactrim>>cephalexin as culprit, however cannot say with certainty that cephalexin did not contribute to reaction.   2) Poorly healing MRSA abscess of R index finger  Patient with pain and slow  healing abscess of R finger, s/p incision and drainage 0000000 with uncertain compliance with outpatient abx therapy of bactrim and cephalexin. Wound culture from ED showed MRSA. The wound was exquisitely tender to touch but intact neurovascularly and with diminishing erythema and no clear fluctuance. He did have full ROM with assistance.  Wound was evaluated by orthopedist and ID. No acute need for surgical intervention. He did not have elevated ESR or a leukocytosis. MRI R hand showed changes consistent with cellulitis, no evidence of soft tissue abscess, tenosynovitis, or osteomyelitis. Vancomycin was started on admission but discontinued when patient transferred to ICU. He will be discharged to complete 7 days of BID doxycycline therapy.  3) Hyponatremia  This is a chronic problem for the patient. His sodium was 122 on admission, which corrected to 130 for his hyperglycemia. Likely also a component of hypovolemic hyponatremia. Sodium recovered to within normal limits after correction of hyperglycemia and fluid resuscitation.   4) Type II diabetes mellitus, uncontrolled  He has not been taking his NPH insulin as an outpatient. His last hbA1c was greater than 14, and his blood sugar here was 436 on admission with normal AG. He was continued on NPH 20 units twice a day and sliding scale insulin while inpatient. He will be discharged on home insulin dose.  Discharge Vitals:  BP 137/86  Pulse 89  Temp(Src) 99.2 F (37.3 C) (Oral)  Resp 20  Ht 5\' 7"  (1.702 m)  Wt 162 lb 4.1 oz (73.6 kg)  BMI 25.41 kg/m2  SpO2 90%  Discharge Labs:  Results for orders placed during the hospital encounter of 09/08/12 (from the past 24 hour(s))  GLUCOSE, CAPILLARY     Status: Abnormal   Collection Time    09/10/12  4:14 PM      Result Value Range   Glucose-Capillary 147 (*) 70 - 99 mg/dL  GLUCOSE, CAPILLARY     Status: Abnormal   Collection Time    09/10/12  9:41 PM      Result Value Range   Glucose-Capillary  209 (*) 70 - 99 mg/dL  GLUCOSE, CAPILLARY     Status: Abnormal   Collection Time    09/11/12  7:51 AM      Result Value Range   Glucose-Capillary 208 (*) 70 - 99 mg/dL  GLUCOSE, CAPILLARY     Status: Abnormal   Collection Time    09/11/12 12:19 PM      Result Value Range   Glucose-Capillary 286 (*) 70 - 99 mg/dL    Signed: Tonia Brooms 09/11/2012, 2:08 PM   Time Spent on Discharge: 35 min Services Ordered on Discharge: none Equipment Ordered on Discharge: none

## 2012-09-11 NOTE — Progress Notes (Signed)
[name] discharged Home per MD order.  Discharge instructions reviewed and discussed with the patient, all questions and concerns answered. Copy of instructions and scripts given to patient.    Medication List    STOP taking these medications       cephALEXin 500 MG capsule  Commonly known as:  KEFLEX     sulfamethoxazole-trimethoprim 800-160 MG per tablet  Commonly known as:  BACTRIM DS      TAKE these medications       doxycycline 100 MG capsule  Commonly known as:  VIBRAMYCIN  Take 1 capsule (100 mg total) by mouth 2 (two) times daily. One po bid x 7 days     insulin aspart protamine- aspart (70-30) 100 UNIT/ML injection  Commonly known as:  NOVOLOG MIX 70/30  Inject 0.3 mLs (30 Units total) into the skin 2 (two) times daily with a meal.        Patients skin is very dry scaly with 2 small skin biopsy site to upper back with sutures intact, no evidence of skin break down,  IV site discontinued and catheter remains intact. Site without signs and symptoms of complications. Dressing and pressure applied.  Patient escorted to car by NT in a wheelchair,  no distress noted upon discharge.  Wynetta Emery, [place] Em[place] C 09/11/2012 2:57 PM

## 2012-09-11 NOTE — Progress Notes (Signed)
Pt refused vitals and labs this morning. I encouraged over and over again, but he still refused.

## 2012-09-11 NOTE — Discharge Summary (Signed)
INTERNAL MEDICINE TEACHING SERVICE Attending Note  Date: 09/11/2012  Patient name: [name]  Medical record number: [id number]  Date of birth: 25-Apr-1971    This patient has been seen and discussed with the house staff. Please see their note for complete details. I concur with their findings and plan.  The treatment plan was discussed in detail with the patient.  Alternatives to treatment, side effects, risks and benefits, and complications were discussed with the patient. Informed consent was obtained. The patient agrees to proceed with the current treatment plan.  Dominic Pea, DO  09/11/2012, 2:44 PM

## 2012-09-15 LAB — CULTURE, BLOOD (ROUTINE X 2)
Culture: NO GROWTH
Culture: NO GROWTH

## 2012-09-18 ENCOUNTER — Ambulatory Visit (INDEPENDENT_AMBULATORY_CARE_PROVIDER_SITE_OTHER): Payer: Self-pay | Admitting: Internal Medicine

## 2012-09-18 ENCOUNTER — Encounter: Payer: Self-pay | Admitting: Internal Medicine

## 2012-09-18 VITALS — BP 139/89 | HR 98 | Temp 96.7°F | Ht 67.0 in | Wt 147.0 lb

## 2012-09-18 DIAGNOSIS — L492 Exfoliation due to erythematous condition involving 20-29 percent of body surface: Secondary | ICD-10-CM

## 2012-09-18 DIAGNOSIS — IMO0001 Reserved for inherently not codable concepts without codable children: Secondary | ICD-10-CM

## 2012-09-18 DIAGNOSIS — L02519 Cutaneous abscess of unspecified hand: Secondary | ICD-10-CM

## 2012-09-18 LAB — COMPLETE METABOLIC PANEL WITH GFR
ALT: 51 U/L (ref 0–53)
AST: 19 U/L (ref 0–37)
Albumin: 3.8 g/dL (ref 3.5–5.2)
Alkaline Phosphatase: 124 U/L — ABNORMAL HIGH (ref 39–117)
BUN: 9 mg/dL (ref 6–23)
CO2: 26 mEq/L (ref 19–32)
Calcium: 9.7 mg/dL (ref 8.4–10.5)
Chloride: 98 mEq/L (ref 96–112)
Creat: 0.87 mg/dL (ref 0.50–1.35)
GFR, Est African American: >89 mL/min
GFR, Est Non African American: >89 mL/min
Glucose, Bld: 477 mg/dL — ABNORMAL HIGH (ref 70–99)
Potassium: 4.6 mEq/L (ref 3.5–5.3)
Sodium: 136 mEq/L (ref 135–145)
Total Bilirubin: 0.5 mg/dL (ref 0.3–1.2)
Total Protein: 7.3 g/dL (ref 6.0–8.3)

## 2012-09-18 LAB — CBC
HCT: 40.7 % (ref 39.0–52.0)
Hemoglobin: 13.4 g/dL (ref 13.0–17.0)
MCH: 27.6 pg (ref 26.0–34.0)
MCHC: 32.9 g/dL (ref 30.0–36.0)
MCV: 83.7 fL (ref 78.0–100.0)
Platelets: 563 10*3/uL — ABNORMAL HIGH (ref 150–400)
RBC: 4.86 MIL/uL (ref 4.22–5.81)
RDW: 13.5 % (ref 11.5–15.5)
WBC: 4.5 10*3/uL (ref 4.0–10.5)

## 2012-09-18 LAB — GLUCOSE, CAPILLARY: Glucose-Capillary: 393 mg/dL — ABNORMAL HIGH (ref 70–99)

## 2012-09-18 NOTE — Assessment & Plan Note (Addendum)
Patient had an abscess on his right index finger and is now s/p I and D. Recent MRI showed residual cellulitis without any deeper infection into bones or joint space.. Patient did miss his appointment with the hand surgeon, however, I do not feel it is necessary to go back to him as he has full range of motion of the finger and there is no evidence of abscess or bone invasion on MRI.  -Continue remaining 2 days of Keflex therapy -uncover wound and let it dry, keep clean and dry -patient knows to call us if he has worsening pain, joint stiffness, redness or drainage.  Plan was discussed and reviewed with Dr. Murlean Caller

## 2012-09-18 NOTE — Progress Notes (Signed)
Internal Medicine Clinic Visit    HPI:  [name] is a 42 y.o. year old male with a history of hypertension, hyperlipidemia, type 2 diabetes who presents for hospital followup of exfoliative dermatitis from severe allergic drug reaction and right hand abscess  Patient was discharged on doxycycline and has been taking it every day. He brings the bottle with him today and there are 5 pills left.  DM: taking 70/30 30 u every morning and night with meals, missed doses approximately 2x per week. Did not take his insulin this morning and just had lunch before his visit today, which may explain why his blood sugar is in the 300s. Patient states that he checks sugars 3x per day, however, his brother who comes in the room later states that he does not have a glucometer at home.  Missed appt with hand surgeon bc he didn't have a ride.   Denies fever, chills, joint pain, denies symptoms of hypoglycemia.     Past Medical History  Diagnosis Date  . Diabetes mellitus type II, uncontrolled 07/17/2006  . ESSENTIAL HYPERTENSION 07/17/2006  . HYPERLIPIDEMIA 11/24/2008  . Pneumonia 10/22/2011    Past Surgical History  Procedure Laterality Date  . Arm surgery       ROS:  A complete review of systems was otherwise negative, except as noted in the HPI.  Allergies: Sulfa antibiotics; Bactrim; and Cephalexin  Medications: Current Outpatient Prescriptions  Medication Sig Dispense Refill  . doxycycline (VIBRAMYCIN) 100 MG capsule Take 1 capsule (100 mg total) by mouth 2 (two) times daily. One po bid x 7 days  14 capsule  0  . insulin aspart protamine- aspart (NOVOLOG MIX 70/30) (70-30) 100 UNIT/ML injection Inject 0.3 mLs (30 Units total) into the skin 2 (two) times daily with a meal.  10 mL  11   No current facility-administered medications for this visit.    History   Social History  . Marital Status: Married    Spouse Name: N/A    Number of Children: N/A  . Years of Education: N/A    Occupational History  . 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 Smoking status: Never Smoker   . Smokeless tobacco: Never Used  . Alcohol Use: No  . Drug Use: No  . Sexually Active: Yes -- Male partner(s)     Comment: Not sexually active since April 2013   Other Topics Concern  . Not on file   Social History Narrative   Lives with wife in [place].   Wife recently had foot amputation; is a type 1 DM.   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    family history includes Alcohol abuse in his father; Cancer in his mother; Hypertension in his brother; and Hypertension (age of onset: 40) in his mother.  Physical Exam Blood pressure 139/89, pulse 98, temperature 96.7 F (35.9 C), temperature source Oral, height 5\' 7"  (1.702 m), weight 147 lb (66.679 kg), SpO2 100.00%. General:  No acute distress, alert and oriented x 3, well-appearing AAM HEENT:  PERRL, EOMI, no lymphadenopathy, moist mucous membranes Cardiovascular:  Regular rate and rhythm, no murmurs, rubs or gallops Respiratory:  Clear to auscultation bilaterally, no wheezes, rales, or rhonchi Abdomen:  Soft, nondistended, nontender, normoactive bowel sounds Extremities:  Warm and well-perfused, no clubbing, cyanosis, or edema. Right index finger wrapped in bandage, removal reveals soft light colored slightly  macerated skin without any purulent treatment, erythema, tenderness. Finger has improved mobility from hospitalization, no pain with ROM. Skin: Dry, scaling skin covering bilateral upper extremities, chest, back. No active lesions or drainage. No erythema. Neuro: Not anxious appearing, no depressed mood, normal affect  Labs: Lab Results  Component Value Date   CREATININE 0.71 09/10/2012   BUN 5* 09/10/2012   NA 136 09/10/2012   K 3.4* 09/10/2012   CL 106 09/10/2012   CO2 21 09/10/2012   Lab Results  Component Value Date   WBC 4.4 09/10/2012   HGB 11.3* 09/10/2012   HCT 32.6*  09/10/2012   MCV 77.4* 09/10/2012   PLT 176 09/10/2012      Assessment and Plan:  Plan was formulated and discussed with Dr. Murlean Caller, attending physician, who also examined and interviewed the patient.   FOLLOWUP: [name] will follow back up in our clinic in approximately one month. [name] knows to call our clinic in the meantime with any questions or new issues.

## 2012-09-18 NOTE — Assessment & Plan Note (Addendum)
Lab Results  Component Value Date   HGBA1C >14.0 09/08/2012   HGBA1C 13.6* 10/22/2011   HGBA1C 13.3* 05/02/2011     Assessment: Diabetes control: poor control (HgbA1C >9%) Progress toward A1C goal:  unable to assess Comments: none  Plan: Medications:  continue current medications Home glucose monitoring: Frequency: 2 times a day Timing: before breakfast;before meals Instruction/counseling given: reminded to bring blood glucose meter & log to each visit, reminded to bring medications to each visit, discussed diet and other instruction/counseling: Patient given instructions on use of needles and syringes by diabetes educator Educational resources provided: brochure;other (see comments) Self management tools provided:   Other plans:    -Patient given information on an inexpensive [employer] glucometer and given a box of needles and syringes in the office -Continue checking blood sugar at least twice per day and when you have symptoms of hyper or hypo-glycemia -Follow up in clinic in one month

## 2012-09-18 NOTE — Patient Instructions (Signed)
General Instructions:  Thank you for coming in today!!!! Please take your insulin as instructed without missing any doses. Please go to [employer] ASAP to get a glucometer.  Return to clinic in one month, please make an appointment at the front desk before leaving today.   Treatment Goals:  Goals (1 Years of Data) as of 09/18/12         As of Today 09/11/12 09/10/12 09/10/12 09/10/12     Blood Pressure    . Blood Pressure < 140/90  139/89 120/78 137/86 109/73 114/76     Result Component    . HEMOGLOBIN A1C < 7.0          . LDL CALC < 100            Progress Toward Treatment Goals:  Treatment Goal 09/18/2012  Hemoglobin A1C unable to assess    Self Care Goals & Plans:  Self Care Goal 09/18/2012  Manage my medications take my medicines as prescribed; bring my medications to every visit; refill my medications on time  Monitor my health keep track of my blood glucose; bring my glucose meter and log to each visit  Eat healthy foods drink diet soda or water instead of juice or soda; eat more vegetables; eat foods that are low in salt  Be physically active find an activity I enjoy    Home Blood Glucose Monitoring 09/18/2012  Check my blood sugar 2 times a day  When to check my blood sugar before breakfast; before meals

## 2012-09-18 NOTE — Assessment & Plan Note (Addendum)
Patient presents as a hospital followup from exfoliative dermatitis thought to be a drug reaction rash, likely from Bactrim. Patient has some residual scaling of the skin on his chest, arms, back, but overall he is doing much better. He is afebrile, blood pressure is 139/89, and he feels well.  -Sutures were removed today without difficulty. No complications of bleeding or retained foreign body -Continue to avoid sulfa antibiotics -Will check CMP and CBC today

## 2012-09-24 NOTE — Progress Notes (Signed)
Case discussed with Dr. Sissy Hoff immediately after the resident saw the patient. We reviewed the resident's history and exam and pertinent patient test results. I agree with the assessment, diagnosis and plan of care documented in the resident's note.

## 2012-10-19 ENCOUNTER — Ambulatory Visit: Payer: Self-pay

## 2012-10-19 ENCOUNTER — Ambulatory Visit: Payer: Self-pay | Admitting: Internal Medicine

## 2012-10-26 ENCOUNTER — Encounter: Payer: Self-pay | Admitting: Internal Medicine

## 2012-10-26 ENCOUNTER — Ambulatory Visit (INDEPENDENT_AMBULATORY_CARE_PROVIDER_SITE_OTHER): Payer: Self-pay | Admitting: Internal Medicine

## 2012-10-26 VITALS — BP 129/86 | HR 108 | Temp 98.1°F | Ht 67.4 in | Wt 146.0 lb

## 2012-10-26 DIAGNOSIS — IMO0001 Reserved for inherently not codable concepts without codable children: Secondary | ICD-10-CM

## 2012-10-26 DIAGNOSIS — N529 Male erectile dysfunction, unspecified: Secondary | ICD-10-CM | POA: Insufficient documentation

## 2012-10-26 LAB — GLUCOSE, CAPILLARY: Glucose-Capillary: 425 mg/dL — ABNORMAL HIGH (ref 70–99)

## 2012-10-26 MED ORDER — METFORMIN HCL 500 MG PO TABS: 500.0000 mg | ORAL_TABLET | Freq: Two times a day (BID) | ORAL | Status: DC

## 2012-10-26 MED ORDER — SILDENAFIL CITRATE 50 MG PO TABS: 50.0000 mg | ORAL_TABLET | ORAL | Status: DC | PRN

## 2012-10-26 NOTE — Assessment & Plan Note (Addendum)
  Lab Results  Component Value Date   HGBA1C >14.0 09/08/2012   HGBA1C 13.6* 10/22/2011   HGBA1C 13.3* 05/02/2011   Lab Results  Component Value Date   MICROALBUR 0.50 10/17/2010   LDLCALC 98 12/17/2010   CREATININE 0.87 09/18/2012   Office Visit on 10/26/2012  Component Date Value Range Status  . Glucose-Capillary 10/26/2012 425* 70 - 99 mg/dL Final   The patient has not been checking his blood sugar since he did not have the lancet device. He believes that he will be able to begin checking his blood sugar within the next few days. Will check these numbers at his next visit so that his insulin regimen can be adjusted as necessary. He did not bring in his financial information today to be eligible for an orange card. He again promises to have this information for his next visit. We do not believe that the patient is in DKA, as patient is asymptomatic and routinely is found to have CBG of >300 during office visits. - Start metformin 500mg  BID - Continue novolog 70:30 15 units BID - Encouraged patient to record blood sugars at least twice a day. - In the future and pending finances and orange card, will need to follow up with CMP, urine labs, and eye exam.

## 2012-10-26 NOTE — Patient Instructions (Signed)
[name], your diabetes if very poorly controlled. Please follow the instructions below so that we can work together to control it. Please begin checking your blood sugars at least twice a day, once before breakfast and once 2 hours after dinner. We are starting you on a new medication for your diabetes called metformin. Take this medication twice a day. We discussed about taking your insulin regularly.  You will have been given a prescription for Viagra. If you have an erection lasting longer than 4 hours, seek medical attention immediately. If you have chest pain, dizziness, or feel light headed after taking the medication, please go to the nearest emergency department.   Type 2 Diabetes Mellitus, Adult Type 2 diabetes mellitus, often simply referred to as type 2 diabetes, is a long-lasting (chronic) disease. In type 2 diabetes, the pancreas does not make enough insulin (a hormone), the cells are less responsive to the insulin that is made (insulin resistance), or both. Normally, insulin moves sugars from food into the tissue cells. The tissue cells use the sugars for energy. The lack of insulin or the lack of normal response to insulin causes excess sugars to build up in the blood instead of going into the tissue cells. As a result, high blood sugar (hyperglycemia) develops. The effect of high sugar (glucose) levels can cause many complications. Type 2 diabetes was also previously called adult-onset diabetes but it can occur at any age.  TREATMENT   You may need to take insulin or diabetes medicine daily to keep blood glucose levels in the desired range.  You will need to match insulin dosing with exercise and healthy food choices. The treatment goal is to maintain the before meal blood sugar (preprandial glucose) level at 70 130 mg/dL. HOME CARE INSTRUCTIONS   Have your hemoglobin A1c level checked twice a year.  Perform daily blood glucose monitoring as directed by your caregiver.  Take  your diabetes medicine or insulin as directed by your caregiver to maintain your blood glucose levels in the desired range.  Never run out of diabetes medicine or insulin. It is needed every day.  Eat healthy foods. Alternate 3 meals with 3 snacks.  Carry a medical alert card or wear your medical alert jewelry.  Carry a 15 gram carbohydrate snack with you at all times to treat low blood glucose (hypoglycemia). Some examples of 15 gram carbohydrate snacks include:  Glucose tablets, 3 or 4   Glucose gel, 15 gram tube  Raisins, 2 tablespoons (24 grams)  Jelly beans, 6  Animal crackers, 8  Regular pop, 4 ounces (120 mL)  Gummy treats, 9  Recognize hypoglycemia. Hypoglycemia occurs with blood glucose levels of 70 mg/dL and below. The risk for hypoglycemia increases when fasting or skipping meals, during or after intense exercise, and during sleep. Hypoglycemia symptoms can include:  Tremors or shakes.  Decreased ability to concentrate.  Sweating.  Increased heart rate.  Headache.  Dry mouth.  Hunger.  Irritability.  Anxiety.  Restless sleep.  Altered speech or coordination.  Confusion.  Treat hypoglycemia promptly. If you are alert and able to safely swallow, follow the 15:15 rule:  Take 15 20 grams of rapid-acting glucose or carbohydrate. Rapid-acting options include glucose gel, glucose tablets, or 4 ounces (120 mL) of fruit juice, regular soda, or low fat milk.  Check your blood glucose level 15 minutes after taking the glucose.  Take 15 20 grams more of glucose if the repeat blood glucose level is still 70 mg/dL or  below.  Eat a meal or snack within 1 hour once blood glucose levels return to normal.  Engage in at least 150 minutes of moderate-intensity physical activity a week, spread over at least 3 days of the week or as directed by your caregiver. In addition, you should engage in resistance exercise at least 2 times a week or as directed by your  caregiver.  Follow up with your caregiver regularly.  Schedule an eye exam soon after the diagnosis of type 2 diabetes and then annually.  Perform daily skin and foot care. Examine your skin and feet daily for cuts, bruises, redness, nail problems, bleeding, blisters, or sores. A foot exam by a caregiver should be done annually.  Brush your teeth and gums at least twice a day and floss at least once a day. Follow up with your dentist regularly.  Share your diabetes management plan with your workplace or school.  Stay up-to-date with immunizations. SEEK MEDICAL CARE IF:   You are unable to eat food or drink fluids for more than 6 hours.  You have nausea and vomiting for more than 6 hours.  There is a change in mental status.  You develop an additional serious illness.  You have diarrhea for more than 6 hours.  You have been sick or have had a fever for a couple of days and are not getting better. SEEK IMMEDIATE MEDICAL CARE IF:  You have difficulty breathing. MAKE SURE YOU:  Understand these instructions.  Will watch your condition.  Will get help right away if you are not doing well or get worse. Document Released: 05/06/2005 Document Revised: 01/29/2012 Document Reviewed: 12/03/2011 [facility] Patient Information 2014 [name].

## 2012-10-26 NOTE — Assessment & Plan Note (Signed)
Patient complains of some degree of erectile dysfunction since being diagnosed with diabetes in 2008. He relates that he still has the desire for intimacy, but can only maintain an erection about 50% of what it used to be. - Begin sildenafil as needed. Patient was appropriately counseled on the risks and benefits.
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
## 2012-10-26 NOTE — Progress Notes (Signed)
This is a Careers information officer Note.  The care of the patient was discussed with Dr. Ellwood Dense and the assessment and plan was formulated with their assistance.  Please see their note for official documentation of the patient encounter.   Subjective:   Patient ID: [name] male   DOB: 1970-12-01 42 y.o.   MRN: [id number]  HPI: [name] is a 42 y.o. male with a history of poorly controled diabetes and right hand abscess presenting for follow up of his medical conditions. With respect to his right index finger abscess, the patient reports complete resolution of symptoms. He denies pain, erythema, or edema. He reports full use of this digit. He denies chills, fevers, or chest pain.  With respect to his diabetes, the patient has not begun checking his blood sugars. He reports that he has obtain a glucometer, test strips, and lances, but he did not have the machine to use the lances. He now has this and believes that he can begin checking his blood sugars in the next few days. He denies symptoms of hyperglycemia or hypoglycemia. Patient endorses a diet including cereal or pancakes in the morning, fast food at lunch, and chicken or hamburgers at dinner. His goal before his next visit will be to limit his consumption of fried foods at lunch.   With respect to his history of hyperlipidemia, the patient reports being told that he could discontinue his pravastatin since his cholesterol levels "looked good" in 2012.   He forgot to bring his financial information today, but knows that he needs to do this to obtain an orange card.    Past Medical History  Diagnosis Date  . Diabetes mellitus type II, uncontrolled 07/17/2006  . ESSENTIAL HYPERTENSION 07/17/2006  . HYPERLIPIDEMIA 11/24/2008  . Pneumonia 10/22/2011   Current Outpatient Prescriptions  Medication Sig Dispense Refill  . insulin aspart protamine- aspart (NOVOLOG MIX 70/30) (70-30) 100 UNIT/ML injection Inject 0.3 mLs (30 Units total)  into the skin 2 (two) times daily with a meal.  10 mL  11  . doxycycline (VIBRAMYCIN) 100 MG capsule Take 1 capsule (100 mg total) by mouth 2 (two) times daily. One po bid x 7 days  14 capsule  0  . metFORMIN (GLUCOPHAGE) 500 MG tablet Take 1 tablet (500 mg total) by mouth 2 (two) times daily with a meal.  60 tablet  2  . sildenafil (VIAGRA) 50 MG tablet Take 1 tablet (50 mg total) by mouth as needed for erectile dysfunction.  5 tablet  2   No current facility-administered medications for this visit.   Family History  Problem Relation Age of Onset  . Hypertension Mother 38  . Cancer Mother     bladder cancer  . Alcohol abuse Father   . Hypertension Brother    History   Social History  . Marital Status: Married    Spouse Name: N/A    Number of Children: N/A  . Years of Education: N/A   Occupational History  . 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 [employer]     Began Jan 2014   Social History Main Topics  . Smoking status: Never Smoker   . Smokeless tobacco: Never Used  . Alcohol Use: Yes     Comment: 1 beer a month  . Drug Use: No  . Sexually Active: Yes -- Male partner(s)     Comment: Not sexually  active since April 2013   Other Topics Concern  . None   Social History Narrative   Lives with his mother and brother.   Wife has had a foot amputation; is a type 1 DM.   Review of Systems: A comprehensive 12 point review of systems was performed and is negative except as stated above. Objective:  Physical Exam: Filed Vitals:   10/26/12 0857  BP: 129/86  Pulse: 108  Temp: 98.1 F (36.7 C)  TempSrc: Oral  Height: 5' 7.4" (1.712 m)  Weight: 146 lb (66.225 kg)  SpO2: 99%   General appearance: alert, cooperative and no distress Head: Normocephalic, without obvious abnormality, atraumatic Eyes: PERRL, EOMI. no scleral/conjunctival injection. Neck: no adenopathy, supple, symmetrical, trachea midline and thyroid not enlarged,  symmetric, no tenderness/mass/nodules Lungs: clear to auscultation bilaterally and normal work of breathing Heart: regular rate and rhythm, S1, S2 normal, no murmur, click, rub or gallop Abdomen: soft, non-tender; bowel sounds normal; no masses,  no organomegaly Extremities: extremities normal, atraumatic, no cyanosis or edema and sensation present in feet bilaterally. Pulses: 2+ and symmetric Skin: Skin color, texture, turgor normal. No rashes or lesions Assessment & Plan:  [name] is a 42 y.o. male with a history of poorly controlled diabetes presenting for follow up.  Type II or unspecified type diabetes mellitus without mention of complication, uncontrolled  Lab Results  Component Value Date   HGBA1C >14.0 09/08/2012   HGBA1C 13.6* 10/22/2011   HGBA1C 13.3* 05/02/2011   Lab Results  Component Value Date   MICROALBUR 0.50 10/17/2010   [place] 98 12/17/2010   CREATININE 0.87 09/18/2012   Office Visit on 10/26/2012  Component Date Value Range Status  . Glucose-Capillary 10/26/2012 425* 70 - 99 mg/dL Final   The patient has not been checking his blood sugar since he did not have the lancet device. He believes that he will be able to begin checking his blood sugar within the next few days. Will check these numbers at his next visit so that his insulin regimen can be adjusted as necessary. He did not bring in his financial information today to be eligible for an orange card. He again promises to have this information for his next visit. We do not believe that the patient is in DKA, as patient is asymptomatic and routinely is found to have CBG of >300 during office visits. - Start metformin 500mg  BID - Continue novolog 70:30 15 units BID - Encouraged patient to record blood sugars at least twice a day. - In the future and pending finances and orange card, will need to follow up with CMP, urine labs, and eye exam.  Erectile dysfunction of organic origin Patient complains of some  degree of erectile dysfunction since being diagnosed with diabetes in 2008. He relates that he still has the desire for intimacy, but can only maintain an erection about 50% of what it used to be. - Begin sildenafil as needed. Patient was appropriately counseled on the risks and benefits.  Hyperlipidemia - Pending finances and orange card, would check a lipid panel at next visit.  FOLLOW UP: Return visit in 2 weeks.

## 2012-10-29 ENCOUNTER — Ambulatory Visit: Payer: Self-pay | Admitting: Internal Medicine

## 2012-11-06 ENCOUNTER — Emergency Department (HOSPITAL_COMMUNITY)
Admission: EM | Admit: 2012-11-06 | Discharge: 2012-11-06 | Disposition: A | Discharge status: Home / Self Care | Payer: Self-pay | Attending: Emergency Medicine | Admitting: Emergency Medicine

## 2012-11-06 ENCOUNTER — Encounter (HOSPITAL_COMMUNITY): Payer: Self-pay | Admitting: *Deleted

## 2012-11-06 DIAGNOSIS — E1169 Type 2 diabetes mellitus with other specified complication: Secondary | ICD-10-CM | POA: Insufficient documentation

## 2012-11-06 DIAGNOSIS — Z8701 Personal history of pneumonia (recurrent): Secondary | ICD-10-CM | POA: Insufficient documentation

## 2012-11-06 DIAGNOSIS — R739 Hyperglycemia, unspecified: Secondary | ICD-10-CM

## 2012-11-06 DIAGNOSIS — E785 Hyperlipidemia, unspecified: Secondary | ICD-10-CM | POA: Insufficient documentation

## 2012-11-06 DIAGNOSIS — I10 Essential (primary) hypertension: Secondary | ICD-10-CM | POA: Insufficient documentation

## 2012-11-06 DIAGNOSIS — M549 Dorsalgia, unspecified: Secondary | ICD-10-CM

## 2012-11-06 LAB — URINALYSIS, ROUTINE W REFLEX MICROSCOPIC
Bilirubin Urine: NEGATIVE
Glucose, UA: >1000 mg/dL — AB
Hgb urine dipstick: NEGATIVE
Ketones, ur: NEGATIVE mg/dL
Leukocytes, UA: NEGATIVE
Nitrite: NEGATIVE
Protein, ur: NEGATIVE mg/dL
Specific Gravity, Urine: 1.043 — ABNORMAL HIGH (ref 1.005–1.030)
Urobilinogen, UA: 0.2 mg/dL (ref 0.0–1.0)
pH: 6 (ref 5.0–8.0)

## 2012-11-06 LAB — URINE MICROSCOPIC-ADD ON

## 2012-11-06 LAB — GLUCOSE, CAPILLARY: Glucose-Capillary: 418 mg/dL — ABNORMAL HIGH (ref 70–99)
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
## 2012-11-06 NOTE — ED Provider Notes (Signed)
Medical screening examination/treatment/procedure(s) were performed by non-physician practitioner and as supervising physician I was immediately available for consultation/collaboration.  Varney Biles, MD 11/06/12 [phone]

## 2012-11-26 ENCOUNTER — Other Ambulatory Visit: Payer: Self-pay

## 2012-12-01 ENCOUNTER — Encounter: Payer: Self-pay | Admitting: Internal Medicine

## 2012-12-23 ENCOUNTER — Other Ambulatory Visit: Payer: Self-pay

## 2013-01-11 NOTE — Progress Notes (Signed)
Patient ID: [name], male   DOB: Feb 13, 1971, 42 y.o.   MRN: [id number] Opened in error

## 2013-03-10 ENCOUNTER — Other Ambulatory Visit: Payer: Self-pay | Admitting: Internal Medicine

## 2013-03-23 ENCOUNTER — Encounter: Payer: Self-pay | Admitting: Internal Medicine

## 2013-03-25 ENCOUNTER — Other Ambulatory Visit: Payer: Self-pay

## 2013-03-30 ENCOUNTER — Encounter: Payer: Self-pay | Admitting: Internal Medicine

## 2013-05-06 NOTE — Progress Notes (Signed)
This encounter was created in error - please disregard.

## 2013-05-22 ENCOUNTER — Encounter (HOSPITAL_COMMUNITY): Payer: Self-pay | Admitting: Emergency Medicine

## 2013-05-22 DIAGNOSIS — R5383 Other fatigue: Secondary | ICD-10-CM

## 2013-05-22 DIAGNOSIS — E119 Type 2 diabetes mellitus without complications: Secondary | ICD-10-CM | POA: Insufficient documentation

## 2013-05-22 DIAGNOSIS — I10 Essential (primary) hypertension: Secondary | ICD-10-CM | POA: Insufficient documentation

## 2013-05-22 DIAGNOSIS — Z8701 Personal history of pneumonia (recurrent): Secondary | ICD-10-CM | POA: Insufficient documentation

## 2013-05-22 DIAGNOSIS — Z794 Long term (current) use of insulin: Secondary | ICD-10-CM | POA: Insufficient documentation

## 2013-05-22 DIAGNOSIS — R5381 Other malaise: Secondary | ICD-10-CM | POA: Insufficient documentation

## 2013-05-22 LAB — GLUCOSE, CAPILLARY: Glucose-Capillary: 413 mg/dL — ABNORMAL HIGH (ref 70–99)

## 2013-05-22 NOTE — ED Notes (Signed)
CBG is 413. Notified Nurse Ed.

## 2013-05-22 NOTE — ED Notes (Signed)
He believes his Blood sugar is high. Feels like it is in 400's.  Ran out of strips and Humalog.

## 2013-05-23 ENCOUNTER — Emergency Department (HOSPITAL_COMMUNITY)
Admission: EM | Admit: 2013-05-23 | Discharge: 2013-05-23 | Disposition: A | Discharge status: Home / Self Care | Payer: Self-pay | Attending: Emergency Medicine | Admitting: Emergency Medicine

## 2013-05-23 ENCOUNTER — Telehealth: Payer: Self-pay | Admitting: Internal Medicine

## 2013-05-23 DIAGNOSIS — R739 Hyperglycemia, unspecified: Secondary | ICD-10-CM

## 2013-05-23 LAB — URINALYSIS, ROUTINE W REFLEX MICROSCOPIC
Bilirubin Urine: NEGATIVE
Glucose, UA: >1000 mg/dL — AB
Hgb urine dipstick: NEGATIVE
Ketones, ur: NEGATIVE mg/dL
Leukocytes, UA: NEGATIVE
Nitrite: NEGATIVE
Protein, ur: NEGATIVE mg/dL
Specific Gravity, Urine: >1.046 — ABNORMAL HIGH (ref 1.005–1.030)
Urobilinogen, UA: 0.2 mg/dL (ref 0.0–1.0)
pH: 5 (ref 5.0–8.0)

## 2013-05-23 LAB — COMPREHENSIVE METABOLIC PANEL
ALT: 12 U/L (ref 0–53)
AST: 17 U/L (ref 0–37)
Albumin: 3.6 g/dL (ref 3.5–5.2)
Alkaline Phosphatase: 144 U/L — ABNORMAL HIGH (ref 39–117)
BUN: 8 mg/dL (ref 6–23)
CO2: 27 mEq/L (ref 19–32)
Calcium: 8.9 mg/dL (ref 8.4–10.5)
Chloride: 100 mEq/L (ref 96–112)
Creatinine, Ser: 0.67 mg/dL (ref 0.50–1.35)
GFR calc Af Amer: >90 mL/min (ref >90)
GFR calc non Af Amer: >90 mL/min (ref >90)
Glucose, Bld: 375 mg/dL — ABNORMAL HIGH (ref 70–99)
Potassium: 4.5 mEq/L (ref 3.7–5.3)
Sodium: 137 mEq/L (ref 137–147)
Total Bilirubin: 0.2 mg/dL — ABNORMAL LOW (ref 0.3–1.2)
Total Protein: 7.4 g/dL (ref 6.0–8.3)

## 2013-05-23 LAB — CBC WITH DIFFERENTIAL/PLATELET
Basophils Absolute: 0 10*3/uL (ref 0.0–0.1)
Basophils Relative: 1 % (ref 0–1)
Eosinophils Absolute: 0.1 10*3/uL (ref 0.0–0.7)
Eosinophils Relative: 3 % (ref 0–5)
HCT: 40.7 % (ref 39.0–52.0)
Hemoglobin: 14.2 g/dL (ref 13.0–17.0)
Lymphocytes Relative: 55 % — ABNORMAL HIGH (ref 12–46)
Lymphs Abs: 2.3 10*3/uL (ref 0.7–4.0)
MCH: 29 pg (ref 26.0–34.0)
MCHC: 34.9 g/dL (ref 30.0–36.0)
MCV: 83.2 fL (ref 78.0–100.0)
Monocytes Absolute: 0.4 10*3/uL (ref 0.1–1.0)
Monocytes Relative: 10 % (ref 3–12)
Neutro Abs: 1.4 10*3/uL — ABNORMAL LOW (ref 1.7–7.7)
Neutrophils Relative %: 32 % — ABNORMAL LOW (ref 43–77)
Platelets: 223 10*3/uL (ref 150–400)
RBC: 4.89 MIL/uL (ref 4.22–5.81)
RDW: 12.7 % (ref 11.5–15.5)
WBC: 4.3 10*3/uL (ref 4.0–10.5)

## 2013-05-23 LAB — GLUCOSE, CAPILLARY: Glucose-Capillary: 189 mg/dL — ABNORMAL HIGH (ref 70–99)

## 2013-05-23 LAB — URINE MICROSCOPIC-ADD ON

## 2013-05-23 MED ORDER — INSULIN ASPART 100 UNIT/ML ~~LOC~~ SOLN
10.0000 [IU] | Freq: Once | SUBCUTANEOUS | Status: AC
  Administered 2013-05-23: 10 [IU] via SUBCUTANEOUS
  Filled 2013-05-23: qty 1

## 2013-05-23 MED ORDER — METFORMIN HCL 500 MG PO TABS: 500.0000 mg | ORAL_TABLET | Freq: Two times a day (BID) | ORAL | Status: DC

## 2013-05-23 MED ORDER — SODIUM CHLORIDE 0.9 % IV BOLUS (SEPSIS)
2000.0000 mL | Freq: Once | INTRAVENOUS | Status: AC
  Administered 2013-05-23: 2000 mL via INTRAVENOUS

## 2013-05-23 MED ORDER — INSULIN ASPART PROT & ASPART (70-30 MIX) 100 UNIT/ML ~~LOC~~ SUSP: 15.0000 [IU] | Freq: Two times a day (BID) | SUBCUTANEOUS | Status: DC

## 2013-05-23 NOTE — ED Provider Notes (Addendum)
CSN: [id number]     Arrival date & time 05/22/13  2218 History   First MD Initiated Contact with Patient 05/23/13 0309     Chief Complaint  Patient presents with  . Hyperglycemia   (Consider location/radiation/quality/duration/timing/severity/associated sxs/prior Treatment) The history is provided by the patient.  [name] is a 43 y.o. male hx of DM, HL, HTN here with hyperglycemia. He ran out of his Humalog and testing strips for the last week. He states that his sugar has been running high in the 400s. Feels weak but denies any nausea vomiting or fevers. Denies any chest pain or shortness of breath. He has type 2 diabetes. Has been urinating more often.    Past Medical History  Diagnosis Date  . Diabetes mellitus type II, uncontrolled 07/17/2006  . ESSENTIAL HYPERTENSION 07/17/2006  . HYPERLIPIDEMIA 11/24/2008  . Pneumonia 10/22/2011   Past Surgical History  Procedure Laterality Date  . Arm surgery     Family History  Problem Relation Age of Onset  . Hypertension Mother 67  . Cancer Mother     bladder cancer  . Alcohol abuse Father   . Hypertension Brother    History  Substance Use Topics  . Smoking status: Never Smoker   . Smokeless tobacco: Never Used  . Alcohol Use: Yes     Comment: 1 beer a month    Review of Systems  Neurological: Positive for weakness.  All other systems reviewed and are negative.    Allergies  Sulfa antibiotics; Bactrim; and Cephalexin  Home Medications   Current Outpatient Rx  Name  Route  Sig  Dispense  Refill  . insulin aspart (NOVOLOG) 100 UNIT/ML injection   Subcutaneous   Inject 30 Units into the skin 3 (three) times daily before meals.          BP 109/72  Pulse 76  Temp(Src) 98.1 F (36.7 C) (Oral)  Resp 18  Ht 5\' 7"  (1.702 m)  Wt 149 lb 11.2 oz (67.903 kg)  BMI 23.44 kg/m2  SpO2 99% Physical Exam  Nursing note and vitals reviewed. Constitutional: He is oriented to person, place, and time. He appears  well-developed and well-nourished.  HENT:  Head: Normocephalic.  MM slightly dry   Eyes: Conjunctivae are normal. Pupils are equal, round, and reactive to light.  Neck: Normal range of motion. Neck supple.  Cardiovascular: Normal rate, regular rhythm and normal heart sounds.   Pulmonary/Chest: Breath sounds normal. No respiratory distress. He has no wheezes. He has no rales.  Abdominal: Soft. Bowel sounds are normal. He exhibits no distension. There is no tenderness.  Musculoskeletal: Normal range of motion.  Neurological: He is alert and oriented to person, place, and time. No cranial nerve deficit. Coordination normal.  Skin: Skin is warm and dry.  Psychiatric: He has a normal mood and affect. His behavior is normal. Judgment and thought content normal.    ED Course  Procedures (including critical care time) Labs Review Labs Reviewed  GLUCOSE, CAPILLARY - Abnormal; Notable for the following:    Glucose-Capillary 413 (*)    All other components within normal limits  CBC WITH DIFFERENTIAL - Abnormal; Notable for the following:    Neutrophils Relative % 32 (*)    Neutro Abs 1.4 (*)    Lymphocytes Relative 55 (*)    All other components within normal limits  COMPREHENSIVE METABOLIC PANEL - Abnormal; Notable for the following:    Glucose, Bld 375 (*)    Alkaline Phosphatase 144 (*)  Total Bilirubin 0.2 (*)    All other components within normal limits  URINALYSIS, ROUTINE W REFLEX MICROSCOPIC - Abnormal; Notable for the following:    Specific Gravity, Urine >1.046 (*)    Glucose, UA >1000 (*)    All other components within normal limits  GLUCOSE, CAPILLARY - Abnormal; Notable for the following:    Glucose-Capillary 189 (*)    All other components within normal limits  URINE MICROSCOPIC-ADD ON   Imaging Review No results found.  EKG Interpretation   None       MDM  No diagnosis found. [name] is a 43 y.o. male here with hyperglycemia from medication  uncompliance. No signs of DKA. After 2L NS and 10 U SQ insulin, CBG was 189. Stable for d/c. He has a doctor's appointment in 2 days and can get his refill. Stable for discharge.    7:12 AM I called internal medicine teaching, who will contact him and get him an appointment this week.    Wandra Arthurs, MD 05/23/13 [id number]  Wandra Arthurs, MD 05/23/13 [id number]  Wandra Arthurs, MD 05/23/13 [phone]

## 2013-05-23 NOTE — ED Notes (Signed)
CBG-189. Notified RN

## 2013-05-23 NOTE — ED Notes (Signed)
Pt discharged home with al belongings, pt alert and ambulatory upon discharge, 2 new RX prescribed, pt verbalizes understanding of discharge instructions, drove self home, NAD

## 2013-05-23 NOTE — Telephone Encounter (Signed)
I received a call from Dr. Darl Householder from the ED in regard to [name] being discharged from there this morning for hyperglycemia after running out of his insulin. It appears he has not been seen in [facility] for some time, last visit noted to be in June 2014 and he recommends sooner follow up which I agree with it. I have sent a message to the front desk and also PCP along with diabetes educator to touch base with patient in regards to scheduling an appointment (next one is far, February with PCP), medications, and blood sugars.

## 2013-05-23 NOTE — Discharge Instructions (Signed)
You need to see your doctor and get your diabetes medicine.   Stay hydrated.   Return to ER if you have vomiting, dehydration, abdominal pain.

## 2013-05-23 NOTE — ED Notes (Signed)
2nd Liter of fluid started, will check CBG at completion of this Liter.

## 2013-05-23 NOTE — ED Notes (Signed)
Pt reports urine frequency, denies any other symptoms.

## 2013-05-24 ENCOUNTER — Telehealth: Payer: Self-pay | Admitting: Dietician

## 2013-05-24 NOTE — Telephone Encounter (Signed)
Please address this Dr. Blaine Hamper

## 2013-05-24 NOTE — Telephone Encounter (Signed)
Asked to call to follow up after ED visit. Left message encouraging patient to bring in paperwork for orange card (insulin would be 6$ instead of 25$ and meter free with strips for 6$) offerring Financial counselor appointment, reminded him of appointment next week.

## 2013-05-31 ENCOUNTER — Encounter: Payer: Self-pay | Admitting: Internal Medicine

## 2013-05-31 ENCOUNTER — Ambulatory Visit (INDEPENDENT_AMBULATORY_CARE_PROVIDER_SITE_OTHER): Payer: Self-pay | Admitting: Internal Medicine

## 2013-05-31 VITALS — BP 119/79 | HR 93 | Temp 99.0°F | Ht 64.0 in | Wt 147.5 lb

## 2013-05-31 DIAGNOSIS — E1165 Type 2 diabetes mellitus with hyperglycemia: Principal | ICD-10-CM

## 2013-05-31 DIAGNOSIS — IMO0001 Reserved for inherently not codable concepts without codable children: Secondary | ICD-10-CM

## 2013-05-31 DIAGNOSIS — Z23 Encounter for immunization: Secondary | ICD-10-CM

## 2013-05-31 LAB — LIPID PANEL
Cholesterol: 155 mg/dL (ref 0–200)
HDL: 31 mg/dL — ABNORMAL LOW (ref >39)
LDL Cholesterol: 77 mg/dL (ref 0–99)
Total CHOL/HDL Ratio: 5 Ratio
Triglycerides: 235 mg/dL — ABNORMAL HIGH (ref <150)
VLDL: 47 mg/dL — ABNORMAL HIGH (ref 0–40)

## 2013-05-31 LAB — POCT GLYCOSYLATED HEMOGLOBIN (HGB A1C): Hemoglobin A1C: >14

## 2013-05-31 LAB — GLUCOSE, CAPILLARY: Glucose-Capillary: 524 mg/dL — ABNORMAL HIGH (ref 70–99)

## 2013-05-31 MED ORDER — INSULIN NPH ISOPHANE & REGULAR (70-30) 100 UNIT/ML ~~LOC~~ SUSP: 30.0000 [IU] | Freq: Two times a day (BID) | SUBCUTANEOUS | Status: DC

## 2013-05-31 MED ORDER — METFORMIN HCL 500 MG PO TABS: 500.0000 mg | ORAL_TABLET | Freq: Two times a day (BID) | ORAL | Status: DC

## 2013-05-31 MED ORDER — INSULIN NPH ISOPHANE & REGULAR (70-30) 100 UNIT/ML ~~LOC~~ SUSP: 15.0000 [IU] | Freq: Two times a day (BID) | SUBCUTANEOUS | Status: DC

## 2013-05-31 NOTE — Patient Instructions (Addendum)
-  I have sent insulin to [facility] (I sent Reli-On, the [facility] brand, this should be cheaper).  Please start taking 30 units twice daily.  I also provided you with a sample pen today.  Metformin may cause diarrhea, so start by taking 500mg  once daily for a week, then increase to twice daily.    -Please make appt with Butch Penny  -You may check your blood sugar 2-3 times per day. -For your diabetes, we are checking your cholesterol today. Also, we are checking your urine to monitor your kidney function.  Please be sure to bring all of your medications with you to every visit.  Should you have any new or worsening symptoms, please be sure to call the clinic at [phone].

## 2013-05-31 NOTE — Progress Notes (Signed)
Subjective:   Patient ID: [name] male   DOB: 1971-03-05 43 y.o.   MRN: [id number]  CC: HFU  HPI: Mr.Allenmichael R Wittig is a 43 y.o. man with h/o DM, HTN, HLD.  He presents for HFU after ED visit for hyperglycemia due to running out of insulin (CBGs had been running in the 400s).  He went to the ED because he knew his sugars were elevated due to dry mouth and polyuria.  He has no other complaints. He reports usually he takes 70/30 30u BID, but has been out of the medication secondary to finances (travels with the circus, scheduled to start again soon).  He never experiences hypoglycemia when taking this insulin, and CBGs usually run in 100s, though have been running in the 400s recently.     Review of Systems: Constitutional: Denies fever, chills, diaphoresis, appetite change HEENT: Denies photophobia, eye pain, redness, hearing loss, ear pain, congestion, sore throat, rhinorrhea, sneezing, mouth sores, trouble swallowing, neck pain, neck stiffness and tinnitus.  Respiratory: Denies SOB, DOE, cough, chest tightness, and wheezing.  Cardiovascular: Denies chest pain, palpitations and leg swelling.  Gastrointestinal: Denies nausea, vomiting, abdominal pain, diarrhea, constipation,blood in stool and abdominal distention.  Genitourinary: Denies dysuria, urgency, frequency, hematuria, flank pain and difficulty urinating.  Musculoskeletal: Denies myalgias, back pain, joint swelling, arthralgias and gait problem.  Skin: Denies pallor, rash and wound.  Neurological: Denies dizziness, seizures, syncope, weakness, lightheadedness, numbness and headaches.  Hematological: no obvious s/s of bleeding    Past Medical History  Diagnosis Date  . Diabetes mellitus type II, uncontrolled 07/17/2006  . ESSENTIAL HYPERTENSION 07/17/2006  . HYPERLIPIDEMIA 11/24/2008  . Pneumonia 10/22/2011   Current Outpatient Prescriptions  Medication Sig Dispense Refill  . insulin aspart (NOVOLOG) 100 UNIT/ML injection  Inject 30 Units into the skin 3 (three) times daily before meals.      . insulin aspart protamine- aspart (NOVOLOG MIX 70/30) (70-30) 100 UNIT/ML injection Inject 0.15 mLs (15 Units total) into the skin 2 (two) times daily with a meal.  10 mL  0  . metFORMIN (GLUCOPHAGE) 500 MG tablet Take 1 tablet (500 mg total) by mouth 2 (two) times daily with a meal.  30 tablet  0   No current facility-administered medications for this visit.   Family History  Problem Relation Age of Onset  . Hypertension Mother 84  . Cancer Mother     bladder cancer  . Alcohol abuse Father   . Hypertension Brother    History   Social History  . Marital Status: Married    Spouse Name: N/A    Number of Children: N/A  . Years of Education: N/A   Occupational History  . 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 [employer]     Began Jan 2014   Social History Main Topics  . Smoking status: Never Smoker   . Smokeless tobacco: Never Used  . Alcohol Use: Yes     Comment: 1 beer a month  . Drug Use: No  . Sexual Activity: Yes    Partners: Female     Comment: Not sexually active since April 2013   Other Topics Concern  . Not on file   Social History Narrative   Lives with his mother and brother.   Wife has had a foot amputation; is a type 1 DM.    Objective:  Physical Exam: Filed Vitals:  05/31/13 1046  BP: 119/79  Pulse: 93  Temp: 99 F (37.2 C)  TempSrc: Oral  Height: 5\' 4"  (1.626 m)  Weight: 147 lb 8 oz (66.906 kg)  SpO2: 99%   General: pleasant, appears as stated age HEENT: PERRL, EOMI, no scleral icterus Cardiac: RRR, no rubs, murmurs or gallops Pulm: clear to auscultation bilaterally, moving normal volumes of air Abd: soft, nontender, nondistended, BS normoactive  Ext: warm and well perfused, no pedal edema Neuro: alert and oriented X3, cranial nerves II-XII grossly intact  Assessment & Plan:  Case and care discussed with Dr. Marinda Elk.  Please see problem  oriented charting for further details. Patient to return in ~1 month for routine follow up with PCP.

## 2013-05-31 NOTE — Assessment & Plan Note (Addendum)
Lab Results  Component Value Date   HGBA1C >14.0 09/08/2012   HGBA1C 13.6* 10/22/2011   HGBA1C 13.3* 05/02/2011     Assessment: Diabetes control: poor control (HgbA1C >9%) Progress toward A1C goal:  unable to assess Comments: A1c today pending  Plan: Medications:  sample of 70/30 pen given to patient, cont 15u BID (sent script of Relion to [place], hopefully more affordable - called patient and told him 15u dosing, as when he left clinic, I suggested 30u - he understands), start metformin 500mg  bid and plan to titrate up to 1000mg  bid Home glucose monitoring: Frequency: 2 times a day Timing: before breakfast;at bedtime Instruction/counseling given: reminded to bring blood glucose meter & log to each visit and reminded to bring medications to each visit Other plans: today also checking lipid profile & urine microalb/cr; also flu shot today; plan for eye exam (camera) at next visit and foot exam

## 2013-06-01 LAB — MICROALBUMIN / CREATININE URINE RATIO
Creatinine, Urine: 24.1 mg/dL
Microalb Creat Ratio: 20.7 mg/g (ref 0.0–30.0)
Microalb, Ur: 0.5 mg/dL (ref 0.00–1.89)

## 2013-06-01 NOTE — Progress Notes (Signed)
Case discussed with Dr. Sharda soon after the resident saw the patient.  We reviewed the resident's history and exam and pertinent patient test results.  I agree with the assessment, diagnosis, and plan of care documented in the resident's note. 

## 2013-06-07 IMAGING — CR DG HAND COMPLETE 3+V*R*
3 series · 3 of 3 positions shown · non-contrast
Comparison: None.

CLINICAL DATA: Right hand pain, at the proximal interphalangeal
joint of the index finger, with swelling

RIGHT HAND - COMPLETE 3+ VIEW

[x hand pa right]
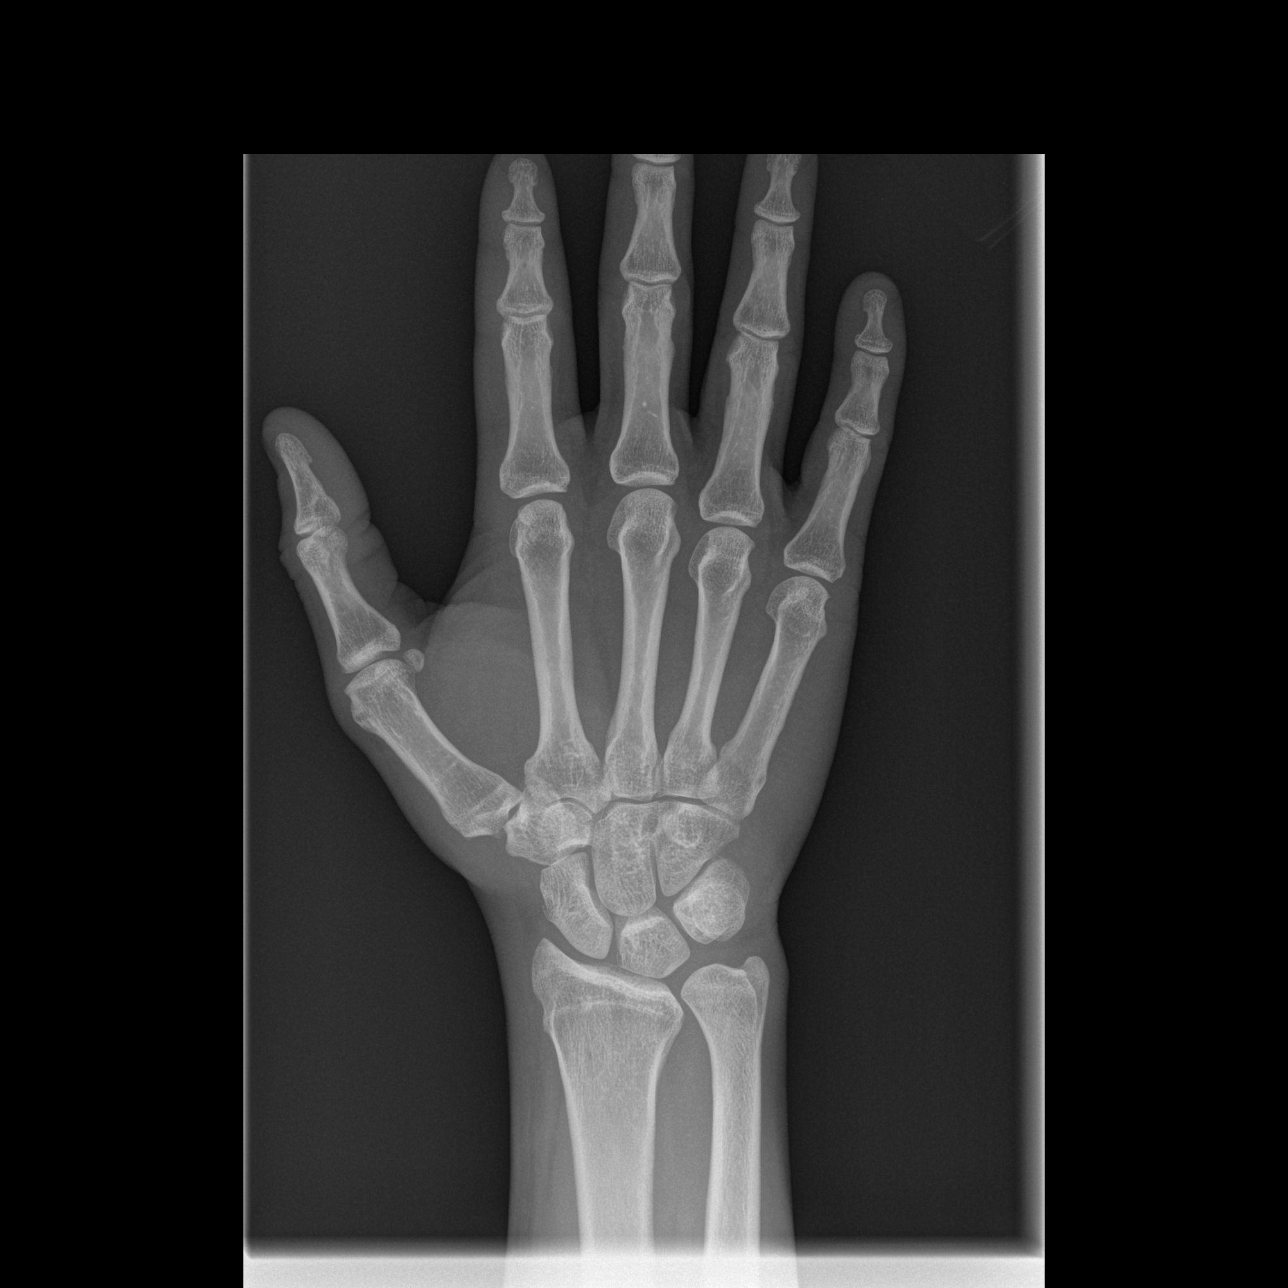

[x hand oblique right]
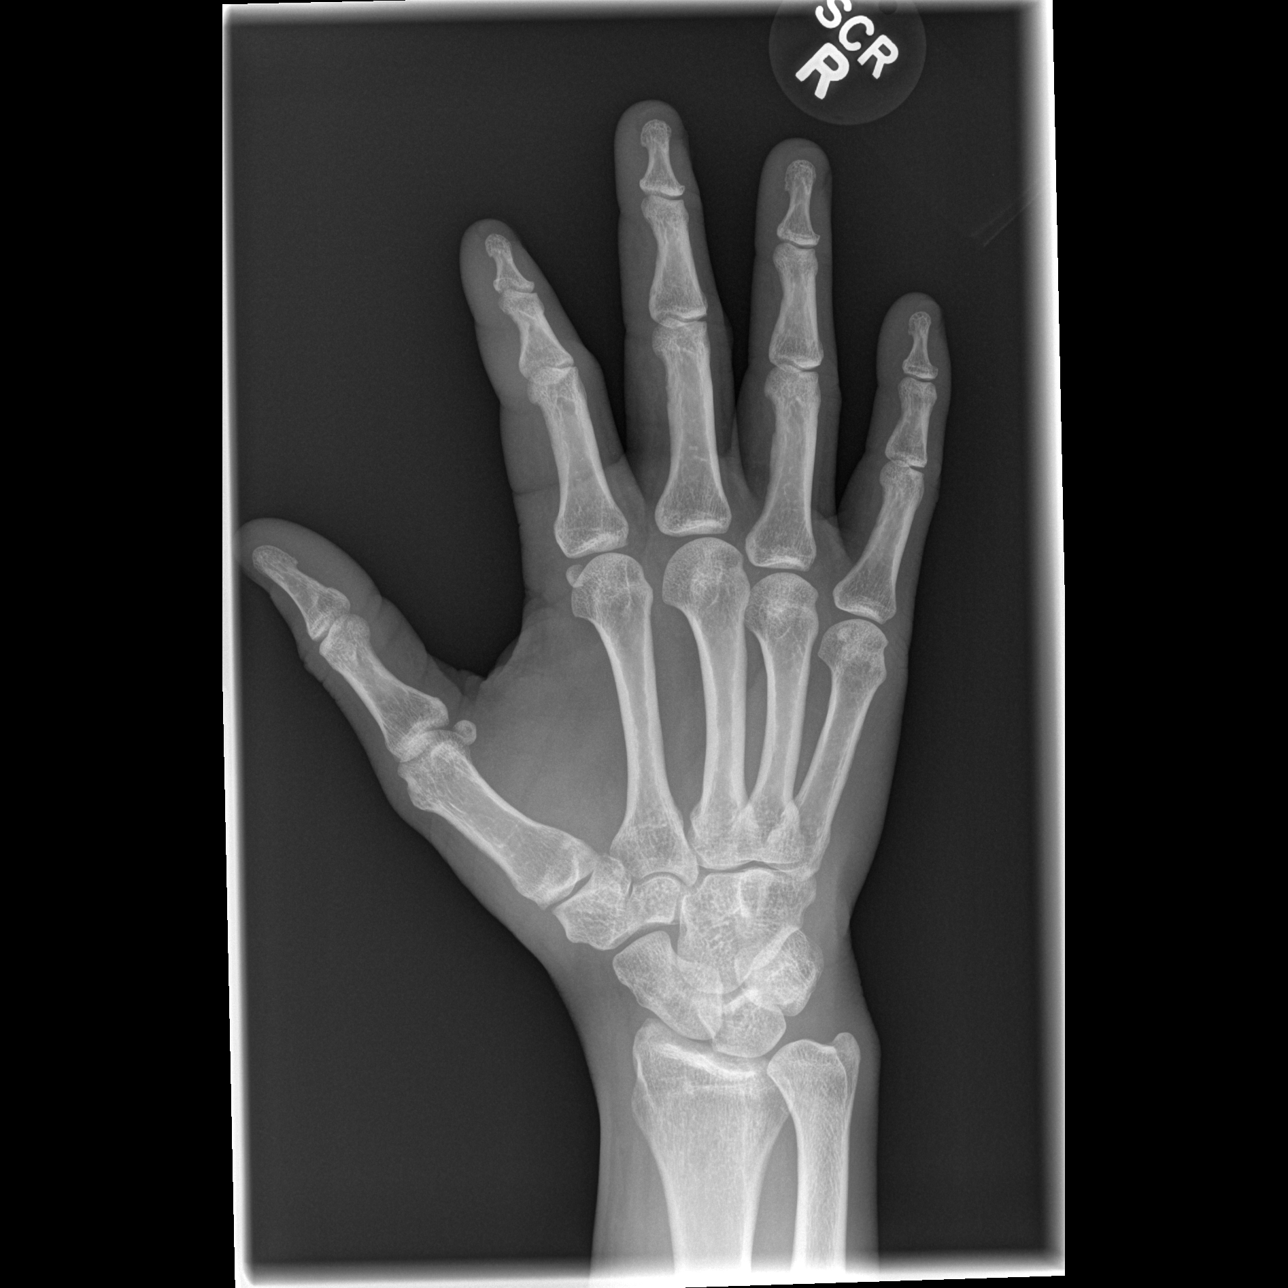

[x hand lat right]
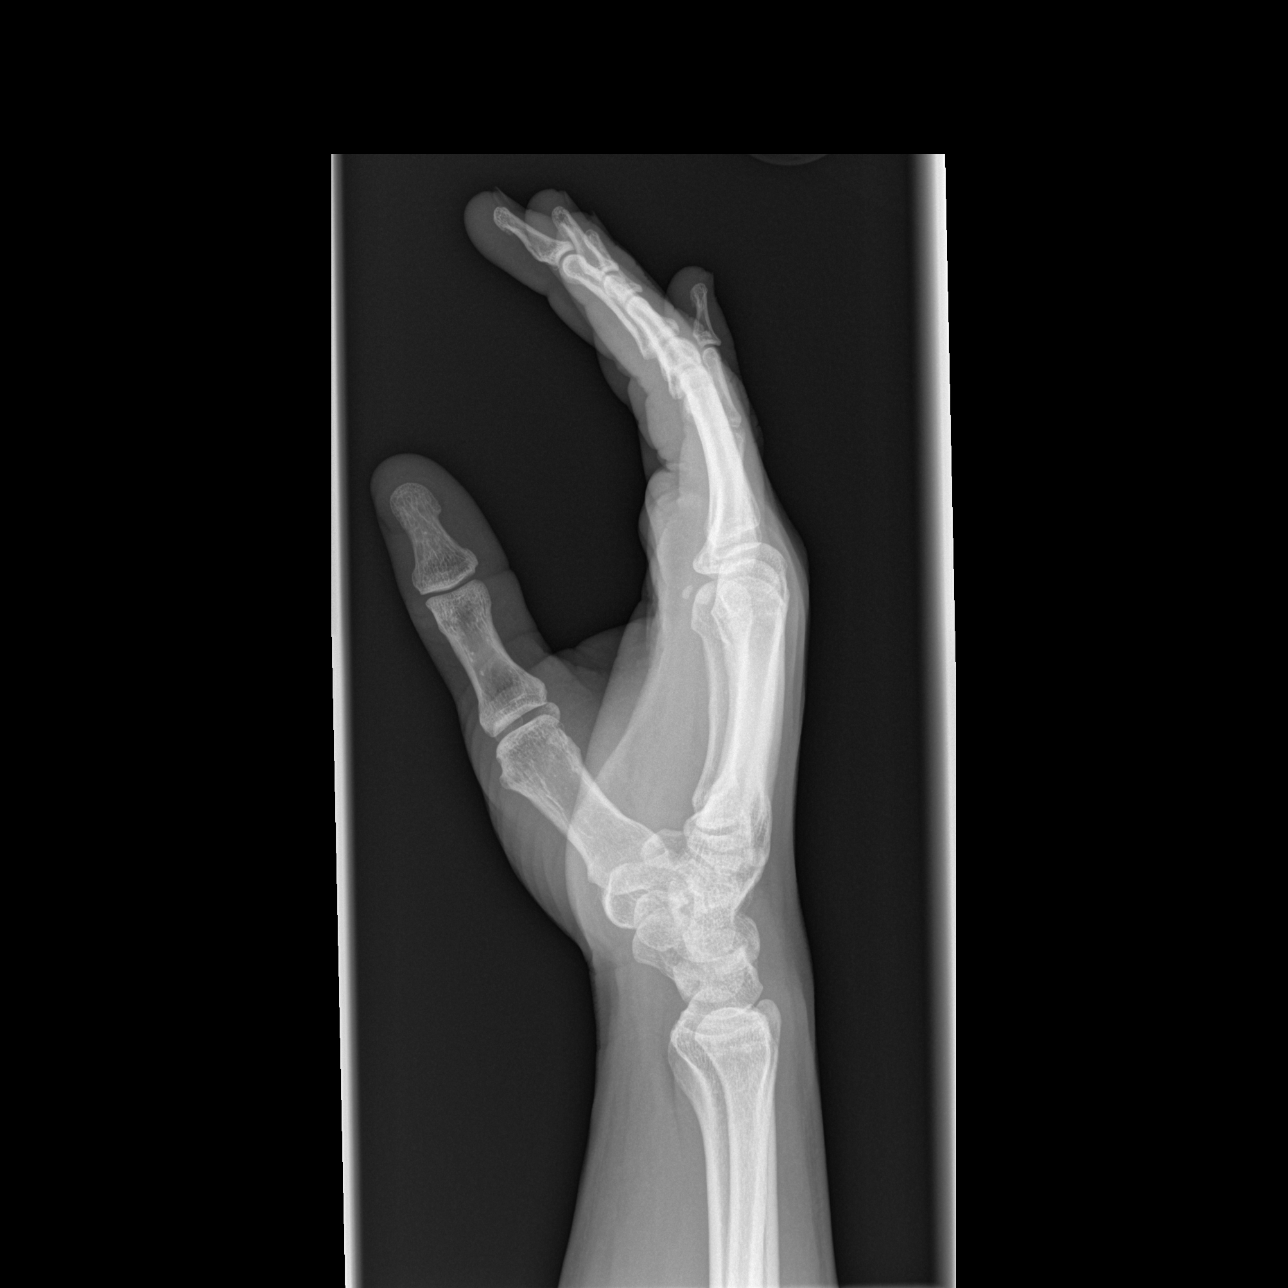

[3 of 3 positions shown; findings below may reference images not displayed]

FINDINGS: There is no evidence of fracture or dislocation.  There
is no radiographic evidence for osteoarthritis.  The joint spaces
are preserved; the soft tissues are unremarkable in appearance.
The carpal rows are intact, and demonstrate normal alignment.
IMPRESSION: No evidence of fracture or dislocation.  The second proximal
interphalangeal joint is grossly unremarkable in appearance.

## 2013-06-22 ENCOUNTER — Encounter: Payer: Self-pay | Admitting: Internal Medicine

## 2013-06-22 ENCOUNTER — Ambulatory Visit: Payer: Self-pay

## 2013-06-22 ENCOUNTER — Ambulatory Visit: Payer: Self-pay | Admitting: Dietician

## 2013-06-22 NOTE — Addendum Note (Signed)
Addended by: Hulan Fray on: 06/22/2013 06:31 PM   Modules accepted: Orders

## 2013-12-23 ENCOUNTER — Ambulatory Visit: Payer: Self-pay | Admitting: Internal Medicine

## 2014-01-06 ENCOUNTER — Emergency Department (HOSPITAL_COMMUNITY)
Admission: EM | Admit: 2014-01-06 | Discharge: 2014-01-07 | Disposition: A | Discharge status: Home / Self Care | Payer: Self-pay | Attending: Emergency Medicine | Admitting: Emergency Medicine

## 2014-01-06 ENCOUNTER — Encounter (HOSPITAL_COMMUNITY): Payer: Self-pay | Admitting: Emergency Medicine

## 2014-01-06 DIAGNOSIS — R739 Hyperglycemia, unspecified: Secondary | ICD-10-CM

## 2014-01-06 DIAGNOSIS — I10 Essential (primary) hypertension: Secondary | ICD-10-CM | POA: Insufficient documentation

## 2014-01-06 DIAGNOSIS — E119 Type 2 diabetes mellitus without complications: Secondary | ICD-10-CM | POA: Insufficient documentation

## 2014-01-06 DIAGNOSIS — Z8701 Personal history of pneumonia (recurrent): Secondary | ICD-10-CM | POA: Insufficient documentation

## 2014-01-06 DIAGNOSIS — R350 Frequency of micturition: Secondary | ICD-10-CM | POA: Insufficient documentation

## 2014-01-06 DIAGNOSIS — Z794 Long term (current) use of insulin: Secondary | ICD-10-CM | POA: Insufficient documentation

## 2014-01-06 LAB — CBG MONITORING, ED
Glucose-Capillary: 468 mg/dL — ABNORMAL HIGH (ref 70–99)
Glucose-Capillary: 340 mg/dL — ABNORMAL HIGH (ref 70–99)
Glucose-Capillary: 380 mg/dL — ABNORMAL HIGH (ref 70–99)

## 2014-01-06 LAB — COMPREHENSIVE METABOLIC PANEL
ALT: 15 U/L (ref 0–53)
AST: 20 U/L (ref 0–37)
Albumin: 3.7 g/dL (ref 3.5–5.2)
Alkaline Phosphatase: 138 U/L — ABNORMAL HIGH (ref 39–117)
Anion gap: 14 (ref 5–15)
BUN: 9 mg/dL (ref 6–23)
CO2: 26 mEq/L (ref 19–32)
Calcium: 9.4 mg/dL (ref 8.4–10.5)
Chloride: 96 mEq/L (ref 96–112)
Creatinine, Ser: 0.73 mg/dL (ref 0.50–1.35)
GFR calc Af Amer: >90 mL/min (ref >90)
GFR calc non Af Amer: >90 mL/min (ref >90)
Glucose, Bld: 493 mg/dL — ABNORMAL HIGH (ref 70–99)
Potassium: 4.7 mEq/L (ref 3.7–5.3)
Sodium: 136 mEq/L — ABNORMAL LOW (ref 137–147)
Total Bilirubin: 0.3 mg/dL (ref 0.3–1.2)
Total Protein: 7.3 g/dL (ref 6.0–8.3)

## 2014-01-06 LAB — CBC
HCT: 39.5 % (ref 39.0–52.0)
Hemoglobin: 13.1 g/dL (ref 13.0–17.0)
MCH: 26.7 pg (ref 26.0–34.0)
MCHC: 33.2 g/dL (ref 30.0–36.0)
MCV: 80.4 fL (ref 78.0–100.0)
Platelets: 248 10*3/uL (ref 150–400)
RBC: 4.91 MIL/uL (ref 4.22–5.81)
RDW: 12.3 % (ref 11.5–15.5)
WBC: 4.7 10*3/uL (ref 4.0–10.5)

## 2014-01-06 LAB — URINALYSIS, ROUTINE W REFLEX MICROSCOPIC
Bilirubin Urine: NEGATIVE
Glucose, UA: >1000 mg/dL — AB
Hgb urine dipstick: NEGATIVE
Ketones, ur: NEGATIVE mg/dL
Leukocytes, UA: NEGATIVE
Nitrite: NEGATIVE
Protein, ur: NEGATIVE mg/dL
Specific Gravity, Urine: 1.04 — ABNORMAL HIGH (ref 1.005–1.030)
Urobilinogen, UA: 1 mg/dL (ref 0.0–1.0)
pH: 5.5 (ref 5.0–8.0)

## 2014-01-06 LAB — URINE MICROSCOPIC-ADD ON

## 2014-01-06 MED ORDER — SODIUM CHLORIDE 0.9 % IV BOLUS (SEPSIS)
500.0000 mL | Freq: Once | INTRAVENOUS | Status: AC
  Administered 2014-01-06: 500 mL via INTRAVENOUS

## 2014-01-06 MED ORDER — SODIUM CHLORIDE 0.9 % IV BOLUS (SEPSIS)
1000.0000 mL | Freq: Once | INTRAVENOUS | Status: AC
  Administered 2014-01-06: 1000 mL via INTRAVENOUS

## 2014-01-06 MED ORDER — INSULIN ASPART PROT & ASPART (70-30 MIX) 100 UNIT/ML ~~LOC~~ SUSP
30.0000 [IU] | Freq: Once | SUBCUTANEOUS | Status: AC
  Administered 2014-01-06: 30 [IU] via SUBCUTANEOUS
  Filled 2014-01-06: qty 10

## 2014-01-06 MED ORDER — INSULIN ASPART PROT & ASPART (70-30 MIX) 100 UNIT/ML ~~LOC~~ SUSP: 30.0000 [IU] | Freq: Two times a day (BID) | SUBCUTANEOUS | Status: DC

## 2014-01-06 NOTE — Discharge Instructions (Signed)
How to Avoid Diabetes Problems You can do a lot to prevent or slow down diabetes problems. Following your diabetes plan and taking care of yourself can reduce your risk of serious or life-threatening complications. Below, you will find certain things you can do to prevent diabetes problems. MANAGE YOUR DIABETES Follow your health care provider's, nurse educator's, and dietitian's instructions for managing your diabetes. They will teach you the basics of diabetes care. They can help answer questions you may have. Learn about diabetes and make healthy choices regarding eating and physical activity. Monitor your blood glucose level regularly. Your health care provider will help you decide how often to check your blood glucose level depending on your treatment goals and how well you are meeting them.  DO NOT USE NICOTINE Nicotine and diabetes are a dangerous combination. Nicotine raises your risk for diabetes problems. If you quit using nicotine, you will lower your risk for heart attack, stroke, nerve disease, and kidney disease. Your cholesterol and your blood pressure levels may improve. Your blood circulation will also improve. Do not use any tobacco products, including cigarettes, chewing tobacco, or electronic cigarettes. If you need help quitting, ask your health care provider. KEEP YOUR BLOOD PRESSURE UNDER CONTROL Keeping your blood pressure under control will help prevent damage to your eyes, kidneys, heart, and blood vessels. Blood pressure consists of two numbers. The top number should be below 120, and the bottom number should be below 80 (120/80). Keep your blood pressure as close to these numbers as you can. If you already have kidney disease, you may want even lower blood pressure to protect your kidneys. Talk to your health care provider to make sure that your blood pressure goal is right for your needs. Meal planning, medicines, and exercise can help you reach your blood pressure target. Have  your blood pressure checked at every visit with your health care provider. KEEP YOUR CHOLESTEROL UNDER CONTROL Normal cholesterol levels will help prevent heart disease and stroke. These are the biggest health problems for people with diabetes. Keeping cholesterol levels under control can also help with blood flow. Have your cholesterol level checked at least once a year. Your health care provider may prescribe a medicine known as a statin. Statins lower your cholesterol. If you are not taking a statin, ask your health care provider if you should be. Meal planning, exercise, and medicines can help you reach your cholesterol targets.  SCHEDULE AND KEEP YOUR ANNUAL PHYSICAL EXAMS AND EYE EXAMS Your health care provider will tell you how often he or she wants to see you depending on your plan of treatment. It is important that you keep these appointments so that possible problems can be identified early and complications can be avoided or treated.  Every visit with your health care provider should include your weight, blood pressure, and an evaluation of your blood glucose control.  Your hemoglobin A1c should be checked:  At least twice a year if you are at your goal.  Every 3 months if there are changes in treatment.  If you are not meeting your goals.  Your blood lipids should be checked yearly. You should also be checked yearly to see if you have protein in your urine (microalbumin).  Schedule a dilated eye exam within 5 years of your diagnosis if you have type 1 diabetes, and then yearly. Schedule a dilated eye exam at diagnosis if you have type 2 diabetes, and then yearly. All exams thereafter can be extended to every 2  to 3 years if one or more exams have been normal. KEEP YOUR VACCINES CURRENT The flu vaccine is recommended yearly. The formula for the vaccine changes every year and needs to be updated for the best protection against current viruses. It is recommended that people with diabetes  who are over 41 years old get the pneumonia vaccine. In some cases, two separate shots may be given. Ask your health care provider if your pneumonia vaccination is up-to-date. However, there are some instances where another vaccine is recommended. Check with your health care provider. TAKE CARE OF YOUR FEET  Diabetes may cause you to have a poor blood supply (circulation) to your legs and feet. Because of this, the skin may be thinner, break easier, and heal more slowly. You also may have nerve damage in your legs and feet, causing decreased feeling. You may not notice minor injuries to your feet that could lead to serious problems or infections. Taking care of your feet is very important. Visual foot exams are performed at every routine medical visit. The exams check for cuts, injuries, or other problems with the feet. A comprehensive foot exam should be done yearly. This includes visual inspection as well as assessing foot pulses and testing for loss of sensation. You should also do the following:  Inspect your feet daily for cuts, calluses, blisters, ingrown toenails, and signs of infection, such as redness, swelling, or pus.  Wash and dry your feet thoroughly, especially between the toes.  Avoid soaking your feet regularly in hot water baths.  Moisturize dry skin with lotion, avoiding areas between your toes.  Cut toenails straight across and file the edges.  Avoid shoes that do not fit well or have areas that irritate your skin.  Avoid going barefooted or wearing only socks. Your feet need protection. TAKE CARE OF YOUR TEETH People with poorly controlled diabetes are more likely to have gum (periodontal) disease. These infections make diabetes harder to control. Periodontal diseases, if left untreated, can lead to tooth loss. Brush your teeth twice a day, floss, and see your dentist for checkups and cleaning every 6 months, or 2 times a year. ASK YOUR HEALTH CARE PROVIDER ABOUT TAKING  ASPIRIN Taking aspirin daily is recommended to help prevent cardiovascular disease in people with and without diabetes. Ask your health care provider if this would benefit you and what dose he or she would recommend. DRINK RESPONSIBLY Moderate amounts of alcohol (less than 1 drink per day for adult women and less than 2 drinks per day for adult men) have a minimal effect on blood glucose if ingested with food. It is important to eat food with alcohol to avoid hypoglycemia. People should avoid alcohol if they have a history of alcohol abuse or dependence, if they are pregnant, and if they have liver disease, pancreatitis, advanced neuropathy, or severe hypertriglyceridemia. LESSEN STRESS Living with diabetes can be stressful. When you are under stress, your blood glucose may be affected in two ways:  Stress hormones may cause your blood glucose to rise.  You may be distracted from taking good care of yourself. It is a good idea to be aware of your stress level and make changes that are necessary to help you better manage challenging situations. Support groups, planned relaxation, a hobby you enjoy, meditation, healthy relationships, and exercise all work to lower your stress level. If your efforts do not seem to be helping, get help from your health care provider or a trained mental health professional. Document  Released: 01/22/2011 Document Revised: 09/20/2013 Document Reviewed: 06/30/2013 [facility] Patient Information 2015 [place], [place]. This information is not intended to replace advice given to you by your health care provider. Make sure you discuss any questions you have with your health care provider.

## 2014-01-06 NOTE — ED Notes (Signed)
Patient assisted to restroom without any difficulty or incident.

## 2014-01-06 NOTE — ED Notes (Signed)
Pt continues to be monitored by blood pressure and pulse ox.

## 2014-01-06 NOTE — ED Notes (Signed)
PA made aware of patients CBG increase. Patient denies any food or drink intake.

## 2014-01-06 NOTE — ED Notes (Signed)
Upon entering patient room I introduced myself and checked patient armband. Patient assigned to room does not match the patient in the bed.

## 2014-01-06 NOTE — ED Notes (Signed)
Per PA holding administration of Novolog at this time. Waiting to see response to IV fluids.

## 2014-01-06 NOTE — ED Provider Notes (Signed)
Patient states he ran out of insulin a few days ago. He presently feels much improved since treatment in the emergency department presently asymptomatic patient alert no distress Glasgow Coma Score 15.  Orlie Dakin, MD 01/06/14 2351

## 2014-01-06 NOTE — ED Notes (Signed)
Family at bedside. 

## 2014-01-06 NOTE — ED Notes (Signed)
Pts CBG 468 reported to nurse.

## 2014-01-06 NOTE — ED Provider Notes (Signed)
CSN: [id number]     Arrival date & time 01/06/14  1855 History   First MD Initiated Contact with Patient 01/06/14 2005     Chief Complaint  Patient presents with  . Hyperglycemia     (Consider location/radiation/quality/duration/timing/severity/associated sxs/prior Treatment) HPI   Patient to the ER with hyperglycemia. He is a known diabetic who takes Novolog 30 units TID but ran out of his insulin 2 days ago and has not had any medications. He has been urinating more frequently but otherwise  Does not report any other symptoms. He reports that he did not get back to get a refill before his current medications ran out and has been unable to get in to see his provider. Denies any other complaints.   Past Medical History  Diagnosis Date  . Diabetes mellitus type II, uncontrolled 07/17/2006  . ESSENTIAL HYPERTENSION 07/17/2006  . HYPERLIPIDEMIA 11/24/2008  . Pneumonia 10/22/2011   Past Surgical History  Procedure Laterality Date  . Arm surgery     Family History  Problem Relation Age of Onset  . Hypertension Mother 86  . Cancer Mother     bladder cancer  . Alcohol abuse Father   . Hypertension Brother    History  Substance Use Topics  . Smoking status: Never Smoker   . Smokeless tobacco: Never Used  . Alcohol Use: Yes     Comment: 1 beer a month    Review of Systems   Review of Systems  Gen: no weight loss, fevers, chills, night sweats  Eyes: no occular draining, occular pain,  No visual changes  Nose: no epistaxis or rhinorrhea  Mouth: no dental pain, no sore throat  Neck: no neck pain  Lungs: No hemoptysis. No wheezing or coughing CV:  No palpitations, dependent edema or orthopnea. No chest pain Abd: no diarrhea. No nausea or vomiting, No abdominal pain  GU: no dysuria or gross hematuria + urinary frequency MSK:  No muscle weakness, No muscular pain Neuro: no headache, no focal neurologic deficits  Skin: no rash , no wounds Psyche: no complaints of depression or  anxiety     Allergies  Sulfa antibiotics; Bactrim; and Cephalexin  Home Medications   Prior to Admission medications   Medication Sig Start Date End Date Taking? Authorizing Provider  insulin aspart protamine- aspart (NOVOLOG MIX 70/30) (70-30) 100 UNIT/ML injection Inject 30 Units into the skin 2 (two) times daily with a meal.   Yes Historical Provider, MD   BP 123/82  Pulse 89  Temp(Src) 98.1 F (36.7 C)  Resp 18  SpO2 100% Physical Exam  Nursing note and vitals reviewed. Constitutional: He appears well-developed and well-nourished. No distress.  HENT:  Head: Normocephalic and atraumatic.  Eyes: Pupils are equal, round, and reactive to light.  Neck: Normal range of motion. Neck supple.  Cardiovascular: Normal rate and regular rhythm.   Pulmonary/Chest: Effort normal. No respiratory distress. He has no wheezes.  Abdominal: Soft. Bowel sounds are normal. He exhibits no distension. There is no tenderness.  Neurological: He is alert.  Skin: Skin is warm and dry.    ED Course  Procedures (including critical care time) Labs Review Labs Reviewed  COMPREHENSIVE METABOLIC PANEL - Abnormal; Notable for the following:    Sodium 136 (*)    Glucose, Bld 493 (*)    Alkaline Phosphatase 138 (*)    All other components within normal limits  URINALYSIS, ROUTINE W REFLEX MICROSCOPIC - Abnormal; Notable for the following:    Specific Gravity,  Urine 1.040 (*)    Glucose, UA >1000 (*)    All other components within normal limits  CBG MONITORING, ED - Abnormal; Notable for the following:    Glucose-Capillary 468 (*)    All other components within normal limits  CBG MONITORING, ED - Abnormal; Notable for the following:    Glucose-Capillary 340 (*)    All other components within normal limits  CBC  URINE MICROSCOPIC-ADD ON  CBG MONITORING, ED    Imaging Review No results found.   EKG Interpretation None      MDM   Final diagnoses:  Type 2 diabetes mellitus without  complication  Hyperglycemia    Patient looks well. Does not have a metabolic acidosis and no anion gap. His glucose has responded well to 2 Liters of fluids and his glucose elevated from 340 to 380. Friend in the room has food and Mr. Spanbauer denies eating any of it. Will give him his normal home dose of Novolog and recheck in glucose in 1 hour. At 12:44 am, his glucose is 169. Case Management to follow-up tomorrow to help pt get medications if he had difficulty obtaining them.  Will refill his insulin medications and I have insisted that he follow-up with his PCP tomorrow or early next week.  43 y.o.Camarion R Merriman's evaluation in the Emergency Department is complete. It has been determined that no acute conditions requiring further emergency intervention are present at this time. The patient/guardian have been advised of the diagnosis and plan. We have discussed signs and symptoms that warrant return to the ED, such as changes or worsening in symptoms.  Vital signs are stable at discharge. Filed Vitals:   01/06/14 2100  BP: 123/82  Pulse: 89  Temp:   Resp: 18    Patient/guardian has voiced understanding and agreed to follow-up with the PCP or specialist.     Linus Mako, PA-C 01/07/14 0045

## 2014-01-06 NOTE — ED Notes (Signed)
Pt reports being out of insulin x 2 days. Reports BG today was 400 and reports urinary frequency. Pt denies any pain. AO x4.

## 2014-01-07 LAB — CBG MONITORING, ED
Glucose-Capillary: 169 mg/dL — ABNORMAL HIGH (ref 70–99)
Glucose-Capillary: 318 mg/dL — ABNORMAL HIGH (ref 70–99)

## 2014-01-07 NOTE — Progress Notes (Signed)
This CM received a message for follow up with patient regarding his DM medications by ED PA Delos Haring. This CM attempted 2 times to contact patient by phone via contact number listed for patient, [phone]. Will continue to attempt to contact the patient. Edwyna Shell, RN, BSN, Case Manager 01/07/2014 12:21 PM

## 2014-01-07 NOTE — ED Provider Notes (Signed)
Medical screening examination/treatment/procedure(s) were conducted as a shared visit with non-physician practitioner(s) and myself.  I personally evaluated the patient during the encounter.   EKG Interpretation None       Orlie Dakin, MD 01/07/14 [phone]

## 2014-01-28 ENCOUNTER — Encounter (HOSPITAL_COMMUNITY): Payer: Self-pay | Admitting: Emergency Medicine

## 2014-01-28 ENCOUNTER — Emergency Department (HOSPITAL_COMMUNITY)
Admission: EM | Admit: 2014-01-28 | Discharge: 2014-01-29 | Disposition: A | Discharge status: Home / Self Care | Payer: Self-pay | Attending: Emergency Medicine | Admitting: Emergency Medicine

## 2014-01-28 DIAGNOSIS — I10 Essential (primary) hypertension: Secondary | ICD-10-CM | POA: Insufficient documentation

## 2014-01-28 DIAGNOSIS — H109 Unspecified conjunctivitis: Secondary | ICD-10-CM | POA: Insufficient documentation

## 2014-01-28 DIAGNOSIS — Z8701 Personal history of pneumonia (recurrent): Secondary | ICD-10-CM | POA: Insufficient documentation

## 2014-01-28 DIAGNOSIS — E119 Type 2 diabetes mellitus without complications: Secondary | ICD-10-CM | POA: Insufficient documentation

## 2014-01-28 DIAGNOSIS — Z794 Long term (current) use of insulin: Secondary | ICD-10-CM | POA: Insufficient documentation

## 2014-01-28 DIAGNOSIS — R739 Hyperglycemia, unspecified: Secondary | ICD-10-CM

## 2014-01-28 LAB — CBC
HCT: 43.3 % (ref 39.0–52.0)
Hemoglobin: 15.2 g/dL (ref 13.0–17.0)
MCH: 28.8 pg (ref 26.0–34.0)
MCHC: 35.1 g/dL (ref 30.0–36.0)
MCV: 82 fL (ref 78.0–100.0)
Platelets: 295 10*3/uL (ref 150–400)
RBC: 5.28 MIL/uL (ref 4.22–5.81)
RDW: 12.6 % (ref 11.5–15.5)
WBC: 6.2 10*3/uL (ref 4.0–10.5)

## 2014-01-28 LAB — URINALYSIS, ROUTINE W REFLEX MICROSCOPIC
Bilirubin Urine: NEGATIVE
Glucose, UA: >1000 mg/dL — AB
Hgb urine dipstick: NEGATIVE
Ketones, ur: NEGATIVE mg/dL
Leukocytes, UA: NEGATIVE
Nitrite: NEGATIVE
Protein, ur: NEGATIVE mg/dL
Specific Gravity, Urine: >1.046 — ABNORMAL HIGH (ref 1.005–1.030)
Urobilinogen, UA: 1 mg/dL (ref 0.0–1.0)
pH: 6 (ref 5.0–8.0)

## 2014-01-28 LAB — COMPREHENSIVE METABOLIC PANEL
ALT: 13 U/L (ref 0–53)
AST: 22 U/L (ref 0–37)
Albumin: 4.2 g/dL (ref 3.5–5.2)
Alkaline Phosphatase: 180 U/L — ABNORMAL HIGH (ref 39–117)
Anion gap: 14 (ref 5–15)
BUN: 9 mg/dL (ref 6–23)
CO2: 26 mEq/L (ref 19–32)
Calcium: 9.2 mg/dL (ref 8.4–10.5)
Chloride: 95 mEq/L — ABNORMAL LOW (ref 96–112)
Creatinine, Ser: 0.73 mg/dL (ref 0.50–1.35)
GFR calc Af Amer: >90 mL/min (ref >90)
GFR calc non Af Amer: >90 mL/min (ref >90)
Glucose, Bld: 323 mg/dL — ABNORMAL HIGH (ref 70–99)
Potassium: 4.5 mEq/L (ref 3.7–5.3)
Sodium: 135 mEq/L — ABNORMAL LOW (ref 137–147)
Total Bilirubin: 0.3 mg/dL (ref 0.3–1.2)
Total Protein: 8.5 g/dL — ABNORMAL HIGH (ref 6.0–8.3)

## 2014-01-28 LAB — URINE MICROSCOPIC-ADD ON: Urine-Other: NONE SEEN

## 2014-01-28 LAB — CBG MONITORING, ED
Glucose-Capillary: 217 mg/dL — ABNORMAL HIGH (ref 70–99)
Glucose-Capillary: 340 mg/dL — ABNORMAL HIGH (ref 70–99)

## 2014-01-28 MED ORDER — SODIUM CHLORIDE 0.9 % IV BOLUS (SEPSIS)
1000.0000 mL | Freq: Once | INTRAVENOUS | Status: AC
  Administered 2014-01-28: 1000 mL via INTRAVENOUS

## 2014-01-28 MED ORDER — ERYTHROMYCIN 5 MG/GM OP OINT
TOPICAL_OINTMENT | Freq: Once | OPHTHALMIC | Status: AC
  Administered 2014-01-28: 1 via OPHTHALMIC
  Filled 2014-01-28: qty 3.5

## 2014-01-28 MED ORDER — ERYTHROMYCIN 5 MG/GM OP OINT: TOPICAL_OINTMENT | Freq: Once | OPHTHALMIC | Status: DC

## 2014-01-28 MED ORDER — INSULIN NPH ISOPHANE & REGULAR (70-30) 100 UNIT/ML ~~LOC~~ SUSP: 30.0000 [IU] | Freq: Two times a day (BID) | SUBCUTANEOUS | Status: DC

## 2014-01-28 NOTE — ED Provider Notes (Signed)
CSN: [id number]     Arrival date & time 01/28/14  1858 History   First MD Initiated Contact with Patient 01/28/14 2140     Chief Complaint  Patient presents with  . Hyperglycemia     (Consider location/radiation/quality/duration/timing/severity/associated sxs/prior Treatment) Patient is a 43 y.o. male presenting with hyperglycemia. The history is provided by the patient.  Hyperglycemia Blood sugar level PTA:  >300 Severity:  Moderate Onset quality:  Gradual Duration:  2 weeks Timing:  Constant Progression:  Unchanged Chronicity:  New Diabetes status:  Controlled with insulin Current diabetic therapy:  70/30, humulin Time since last antidiabetic medication:  2 weeks Context: noncompliance   Relieved by:  Nothing Ineffective treatments:  None tried Associated symptoms: fatigue   Associated symptoms: no abdominal pain, no dehydration, no fever, no shortness of breath, no vomiting and no weakness     Past Medical History  Diagnosis Date  . Diabetes mellitus type II, uncontrolled 07/17/2006  . ESSENTIAL HYPERTENSION 07/17/2006  . HYPERLIPIDEMIA 11/24/2008  . Pneumonia 10/22/2011   Past Surgical History  Procedure Laterality Date  . Arm surgery     Family History  Problem Relation Age of Onset  . Hypertension Mother 21  . Cancer Mother     bladder cancer  . Alcohol abuse Father   . Hypertension Brother    History  Substance Use Topics  . Smoking status: Never Smoker   . Smokeless tobacco: Never Used  . Alcohol Use: Yes     Comment: 1 beer a month    Review of Systems  Constitutional: Positive for fatigue. Negative for fever and chills.  Respiratory: Negative for shortness of breath.   Gastrointestinal: Negative for vomiting and abdominal pain.  All other systems reviewed and are negative.     Allergies  Sulfa antibiotics; Bactrim; and Cephalexin  Home Medications   Prior to Admission medications   Medication Sig Start Date End Date Taking? Authorizing  Provider  cetirizine (ZYRTEC) 10 MG tablet Take 10 mg by mouth daily as needed for allergies.   Yes Historical Provider, MD  insulin NPH-regular Human (NOVOLIN 70/30) (70-30) 100 UNIT/ML injection Inject 30 Units into the skin 2 (two) times daily with a meal.   Yes Historical Provider, MD   BP 146/95  Pulse 111  Temp(Src) 99.6 F (37.6 C) (Oral)  Resp 18  Ht 5\' 7"  (1.702 m)  Wt 140 lb (63.504 kg)  BMI 21.92 kg/m2  SpO2 98% Physical Exam  Nursing note and vitals reviewed. Constitutional: He is oriented to person, place, and time. He appears well-developed and well-nourished. No distress.  HENT:  Head: Normocephalic and atraumatic.  Mouth/Throat: Oropharynx is clear and moist. No oropharyngeal exudate.  Eyes: EOM are normal. Pupils are equal, round, and reactive to light. Right eye exhibits no discharge.  R eye with redness, diffuse scleral redness  Neck: Normal range of motion. Neck supple.  Cardiovascular: Normal rate and regular rhythm.  Exam reveals no friction rub.   No murmur heard. Pulmonary/Chest: Effort normal and breath sounds normal. No respiratory distress. He has no wheezes. He has no rales.  Abdominal: Soft. He exhibits no distension. There is no tenderness. There is no rebound.  Musculoskeletal: Normal range of motion. He exhibits no edema.  Neurological: He is alert and oriented to person, place, and time.  Skin: No rash noted. He is not diaphoretic.    ED Course  Procedures (including critical care time) Labs Review Labs Reviewed  COMPREHENSIVE METABOLIC PANEL - Abnormal; Notable for  the following:    Sodium 135 (*)    Chloride 95 (*)    Glucose, Bld 323 (*)    Total Protein 8.5 (*)    Alkaline Phosphatase 180 (*)    All other components within normal limits  URINALYSIS, ROUTINE W REFLEX MICROSCOPIC - Abnormal; Notable for the following:    Specific Gravity, Urine >1.046 (*)    Glucose, UA >1000 (*)    All other components within normal limits  CBG  MONITORING, ED - Abnormal; Notable for the following:    Glucose-Capillary 340 (*)    All other components within normal limits  CBC  URINE MICROSCOPIC-ADD ON  CBG MONITORING, ED    Imaging Review No results found.   EKG Interpretation None      MDM   Final diagnoses:  Hyperglycemia  Conjunctivitis of right eye    54M here with hyperglycemia, general malaise. Out of his 70/30 insulin for a few weeks. Also concerned about his R eye - became red earlier today. All of his malaise started earlier today. No fevers, no N/V/D. No abdominal pain. R eye with conjunctivitis here, abdomen benign. Will check labs, give fluids, apply erythromycin on his eye. Patient's labs with no signs of DKA, normal bicarb, anion gap of 14. Urine concentrated, will hydrate and give Rx for insulin. Repeat sugar 214. Stable for discharge, wanting to eat.   Evelina Bucy, MD 01/29/14 0001

## 2014-01-28 NOTE — ED Notes (Signed)
Pt presents with R eye drainage and hyperglycemia, pt states he has been out of insulin x 2 weeks, he has a prescription but is unable to get it filled. A & O, NAD.

## 2014-01-28 NOTE — Discharge Instructions (Signed)
Conjunctivitis Conjunctivitis is commonly called "pink eye." Conjunctivitis can be caused by bacterial or viral infection, allergies, or injuries. There is usually redness of the lining of the eye, itching, discomfort, and sometimes discharge. There may be deposits of matter along the eyelids. A viral infection usually causes a watery discharge, while a bacterial infection causes a yellowish, thick discharge. Pink eye is very contagious and spreads by direct contact. You may be given antibiotic eyedrops as part of your treatment. Before using your eye medicine, remove all drainage from the eye by washing gently with warm water and cotton balls. Continue to use the medication until you have awakened 2 mornings in a row without discharge from the eye. Do not rub your eye. This increases the irritation and helps spread infection. Use separate towels from other household members. Wash your hands with soap and water before and after touching your eyes. Use cold compresses to reduce pain and sunglasses to relieve irritation from light. Do not wear contact lenses or wear eye makeup until the infection is gone. SEEK MEDICAL CARE IF:   Your symptoms are not better after 3 days of treatment.  You have increased pain or trouble seeing.  The outer eyelids become very red or swollen. Document Released: 06/13/2004 Document Revised: 07/29/2011 Document Reviewed: 05/06/2005 [facility] Patient Information 2015 [place], [place]. This information is not intended to replace advice given to you by your health care provider. Make sure you discuss any questions you have with your health care provider.  Hyperglycemia Hyperglycemia occurs when the glucose (sugar) in your blood is too high. Hyperglycemia can happen for many reasons, but it most often happens to people who do not know they have diabetes or are not managing their diabetes properly.  CAUSES  Whether you have diabetes or not, there are other causes of hyperglycemia.  Hyperglycemia can occur when you have diabetes, but it can also occur in other situations that you might not be as aware of, such as: Diabetes  If you have diabetes and are having problems controlling your blood glucose, hyperglycemia could occur because of some of the following reasons:  Not following your meal plan.  Not taking your diabetes medications or not taking it properly.  Exercising less or doing less activity than you normally do.  Being sick. Pre-diabetes  This cannot be ignored. Before people develop Type 2 diabetes, they almost always have "pre-diabetes." This is when your blood glucose levels are higher than normal, but not yet high enough to be diagnosed as diabetes. Research has shown that some long-term damage to the body, especially the heart and circulatory system, may already be occurring during pre-diabetes. If you take action to manage your blood glucose when you have pre-diabetes, you may delay or prevent Type 2 diabetes from developing. Stress  If you have diabetes, you may be "diet" controlled or on oral medications or insulin to control your diabetes. However, you may find that your blood glucose is higher than usual in the hospital whether you have diabetes or not. This is often referred to as "stress hyperglycemia." Stress can elevate your blood glucose. This happens because of hormones put out by the body during times of stress. If stress has been the cause of your high blood glucose, it can be followed regularly by your caregiver. That way he/she can make sure your hyperglycemia does not continue to get worse or progress to diabetes. Steroids  Steroids are medications that act on the infection fighting system (immune system) to block  inflammation or infection. One side effect can be a rise in blood glucose. Most people can produce enough extra insulin to allow for this rise, but for those who cannot, steroids make blood glucose levels go even higher. It is not  unusual for steroid treatments to "uncover" diabetes that is developing. It is not always possible to determine if the hyperglycemia will go away after the steroids are stopped. A special blood test called an A1c is sometimes done to determine if your blood glucose was elevated before the steroids were started. SYMPTOMS  Thirsty.  Frequent urination.  Dry mouth.  Blurred vision.  Tired or fatigue.  Weakness.  Sleepy.  Tingling in feet or leg. DIAGNOSIS  Diagnosis is made by monitoring blood glucose in one or all of the following ways:  A1c test. This is a chemical found in your blood.  Fingerstick blood glucose monitoring.  Laboratory results. TREATMENT  First, knowing the cause of the hyperglycemia is important before the hyperglycemia can be treated. Treatment may include, but is not be limited to:  Education.  Change or adjustment in medications.  Change or adjustment in meal plan.  Treatment for an illness, infection, etc.  More frequent blood glucose monitoring.  Change in exercise plan.  Decreasing or stopping steroids.  Lifestyle changes. HOME CARE INSTRUCTIONS   Test your blood glucose as directed.  Exercise regularly. Your caregiver will give you instructions about exercise. Pre-diabetes or diabetes which comes on with stress is helped by exercising.  Eat wholesome, balanced meals. Eat often and at regular, fixed times. Your caregiver or nutritionist will give you a meal plan to guide your sugar intake.  Being at an ideal weight is important. If needed, losing as little as 10 to 15 pounds may help improve blood glucose levels. SEEK MEDICAL CARE IF:   You have questions about medicine, activity, or diet.  You continue to have symptoms (problems such as increased thirst, urination, or weight gain). SEEK IMMEDIATE MEDICAL CARE IF:   You are vomiting or have diarrhea.  Your breath smells fruity.  You are breathing faster or slower.  You are very  sleepy or incoherent.  You have numbness, tingling, or pain in your feet or hands.  You have chest pain.  Your symptoms get worse even though you have been following your caregiver's orders.  If you have any other questions or concerns. Document Released: 10/30/2000 Document Revised: 07/29/2011 Document Reviewed: 09/02/2011 [facility] Patient Information 2015 [place], [place]. This information is not intended to replace advice given to you by your health care provider. Make sure you discuss any questions you have with your health care provider.

## 2014-01-28 NOTE — ED Notes (Signed)
Pt c/o hyperglycemia and R eye drainage. When asked what his sugars have been at home when he checks them pt sts "I have been out of my meds for two weeks, so. . . ". Pt cannot tell RN when he checked his sugar last. Pt denies pain. Pt sts his eyes have been blood whot x 1 week and that his R eye started draining today. A&Ox4.

## 2014-02-15 ENCOUNTER — Encounter (HOSPITAL_COMMUNITY): Payer: Self-pay | Admitting: Emergency Medicine

## 2014-02-15 ENCOUNTER — Emergency Department (HOSPITAL_COMMUNITY)
Admission: EM | Admit: 2014-02-15 | Discharge: 2014-02-16 | Disposition: A | Discharge status: Home / Self Care | Payer: Self-pay | Attending: Emergency Medicine | Admitting: Emergency Medicine

## 2014-02-15 DIAGNOSIS — I10 Essential (primary) hypertension: Secondary | ICD-10-CM | POA: Insufficient documentation

## 2014-02-15 DIAGNOSIS — R739 Hyperglycemia, unspecified: Secondary | ICD-10-CM

## 2014-02-15 DIAGNOSIS — Z794 Long term (current) use of insulin: Secondary | ICD-10-CM | POA: Insufficient documentation

## 2014-02-15 DIAGNOSIS — Z8701 Personal history of pneumonia (recurrent): Secondary | ICD-10-CM | POA: Insufficient documentation

## 2014-02-15 DIAGNOSIS — E1165 Type 2 diabetes mellitus with hyperglycemia: Principal | ICD-10-CM

## 2014-02-15 DIAGNOSIS — Z760 Encounter for issue of repeat prescription: Secondary | ICD-10-CM | POA: Insufficient documentation

## 2014-02-15 DIAGNOSIS — IMO0001 Reserved for inherently not codable concepts without codable children: Secondary | ICD-10-CM | POA: Insufficient documentation

## 2014-02-15 LAB — COMPREHENSIVE METABOLIC PANEL
ALT: 14 U/L (ref 0–53)
AST: 19 U/L (ref 0–37)
Albumin: 3.6 g/dL (ref 3.5–5.2)
Alkaline Phosphatase: 131 U/L — ABNORMAL HIGH (ref 39–117)
Anion gap: 14 (ref 5–15)
BUN: 13 mg/dL (ref 6–23)
CO2: 24 mEq/L (ref 19–32)
Calcium: 9.2 mg/dL (ref 8.4–10.5)
Chloride: 97 mEq/L (ref 96–112)
Creatinine, Ser: 0.86 mg/dL (ref 0.50–1.35)
GFR calc Af Amer: >90 mL/min (ref >90)
GFR calc non Af Amer: >90 mL/min (ref >90)
Glucose, Bld: 523 mg/dL — ABNORMAL HIGH (ref 70–99)
Potassium: 4.4 mEq/L (ref 3.7–5.3)
Sodium: 135 mEq/L — ABNORMAL LOW (ref 137–147)
Total Bilirubin: 0.3 mg/dL (ref 0.3–1.2)
Total Protein: 7.6 g/dL (ref 6.0–8.3)

## 2014-02-15 LAB — CBC WITH DIFFERENTIAL/PLATELET
Basophils Absolute: 0 10*3/uL (ref 0.0–0.1)
Basophils Relative: 1 % (ref 0–1)
Eosinophils Absolute: 0.1 10*3/uL (ref 0.0–0.7)
Eosinophils Relative: 1 % (ref 0–5)
HCT: 39.4 % (ref 39.0–52.0)
Hemoglobin: 13.4 g/dL (ref 13.0–17.0)
Lymphocytes Relative: 50 % — ABNORMAL HIGH (ref 12–46)
Lymphs Abs: 2.2 10*3/uL (ref 0.7–4.0)
MCH: 27.4 pg (ref 26.0–34.0)
MCHC: 34 g/dL (ref 30.0–36.0)
MCV: 80.6 fL (ref 78.0–100.0)
Monocytes Absolute: 0.5 10*3/uL (ref 0.1–1.0)
Monocytes Relative: 11 % (ref 3–12)
Neutro Abs: 1.6 10*3/uL — ABNORMAL LOW (ref 1.7–7.7)
Neutrophils Relative %: 37 % — ABNORMAL LOW (ref 43–77)
Platelets: 260 10*3/uL (ref 150–400)
RBC: 4.89 MIL/uL (ref 4.22–5.81)
RDW: 12.6 % (ref 11.5–15.5)
WBC: 4.3 10*3/uL (ref 4.0–10.5)

## 2014-02-15 MED ORDER — SODIUM CHLORIDE 0.9 % IV BOLUS (SEPSIS)
2000.0000 mL | Freq: Once | INTRAVENOUS | Status: AC
  Administered 2014-02-15: 2000 mL via INTRAVENOUS

## 2014-02-15 NOTE — ED Provider Notes (Signed)
CSN: [id number]     Arrival date & time 02/15/14  2251 History   First MD Initiated Contact with Patient 02/15/14 2301     Chief Complaint  Patient presents with  . Hyperglycemia  . Urinary Frequency     (Consider location/radiation/quality/duration/timing/severity/associated sxs/prior Treatment) HPI Patient presents with hyperglycemia. States he took his sugar and it was in the 300s. Running at 300's and 400s for the past 2 days. Patient states he's been out of his insulin and syringes for the past 2 days. He denies any pain. He's had no fever or chills. He admits to general malaise. He's had increased urinary frequency without dysuria. He denies any hematuria. Past Medical History  Diagnosis Date  . Diabetes mellitus type II, uncontrolled 07/17/2006  . ESSENTIAL HYPERTENSION 07/17/2006  . HYPERLIPIDEMIA 11/24/2008  . Pneumonia 10/22/2011   Past Surgical History  Procedure Laterality Date  . Arm surgery     Family History  Problem Relation Age of Onset  . Hypertension Mother 96  . Cancer Mother     bladder cancer  . Alcohol abuse Father   . Hypertension Brother    History  Substance Use Topics  . Smoking status: Never Smoker   . Smokeless tobacco: Never Used  . Alcohol Use: Yes     Comment: 1 beer a month    Review of Systems  Constitutional: Positive for fatigue. Negative for fever and chills.  Respiratory: Negative for shortness of breath.   Cardiovascular: Negative for chest pain.  Gastrointestinal: Negative for nausea, vomiting, abdominal pain and diarrhea.  Genitourinary: Positive for frequency. Negative for dysuria, hematuria and flank pain.  Musculoskeletal: Negative for back pain, myalgias, neck pain and neck stiffness.  Skin: Negative for rash and wound.  Neurological: Negative for dizziness, weakness, light-headedness, numbness and headaches.  All other systems reviewed and are negative.     Allergies  Sulfa antibiotics; Bactrim; and Cephalexin  Home  Medications   Prior to Admission medications   Medication Sig Start Date End Date Taking? Authorizing Provider  insulin NPH-regular Human (NOVOLIN 70/30) (70-30) 100 UNIT/ML injection Inject 30 Units into the skin 2 (two) times daily with a meal. 01/28/14  Yes Evelina Bucy, MD   BP 131/80  Pulse 111  Temp(Src) 98.7 F (37.1 C) (Oral)  Resp 22  SpO2 99% Physical Exam  Nursing note and vitals reviewed. Constitutional: He is oriented to person, place, and time. He appears well-developed and well-nourished. No distress.  HENT:  Head: Normocephalic and atraumatic.  Mouth/Throat: Oropharynx is clear and moist. No oropharyngeal exudate.  Eyes: EOM are normal. Pupils are equal, round, and reactive to light.  Neck: Normal range of motion. Neck supple.  Cardiovascular: Normal rate and regular rhythm.   Pulmonary/Chest: Effort normal and breath sounds normal. No respiratory distress. He has no wheezes. He has no rales. He exhibits no tenderness.  Abdominal: Soft. Bowel sounds are normal. He exhibits no distension and no mass. There is no tenderness. There is no rebound and no guarding.  Musculoskeletal: Normal range of motion. He exhibits no edema and no tenderness.  Neurological: He is alert and oriented to person, place, and time.  Moves all extremities without deficit. Sensation is grossly intact.  Skin: Skin is warm and dry. No rash noted. No erythema.  Psychiatric: He has a normal mood and affect. His behavior is normal.    ED Course  Procedures (including critical care time) Labs Review Labs Reviewed  CBC WITH DIFFERENTIAL  COMPREHENSIVE METABOLIC PANEL  URINALYSIS, ROUTINE W REFLEX MICROSCOPIC  CBG MONITORING, ED    Imaging Review No results found.   EKG Interpretation None      MDM   Final diagnoses:  None    Hypoglycemia improved. Vital signs stabilized. Prescriptions for refill of insulin, glucose strips and syringes. Patient's advised to followup with his primary  Dr.    Julianne Rice, MD 02/16/14 [id number]

## 2014-02-15 NOTE — ED Notes (Signed)
Pt unable to give sample at this time, states " I dont need to go at this time" pt aware of urine sample needed.

## 2014-02-15 NOTE — ED Notes (Signed)
Pt. ran out of his insulin 2 days ago , reports urinary frequency .

## 2014-02-16 LAB — URINALYSIS, ROUTINE W REFLEX MICROSCOPIC
Bilirubin Urine: NEGATIVE
Glucose, UA: >1000 mg/dL — AB
Hgb urine dipstick: NEGATIVE
Ketones, ur: NEGATIVE mg/dL
Leukocytes, UA: NEGATIVE
Nitrite: NEGATIVE
Protein, ur: NEGATIVE mg/dL
Specific Gravity, Urine: 1.04 — ABNORMAL HIGH (ref 1.005–1.030)
Urobilinogen, UA: 0.2 mg/dL (ref 0.0–1.0)
pH: 6 (ref 5.0–8.0)

## 2014-02-16 LAB — URINE MICROSCOPIC-ADD ON

## 2014-02-16 LAB — CBG MONITORING, ED
Glucose-Capillary: 478 mg/dL — ABNORMAL HIGH (ref 70–99)
Glucose-Capillary: 98 mg/dL (ref 70–99)
Glucose-Capillary: 222 mg/dL — ABNORMAL HIGH (ref 70–99)

## 2014-02-16 MED ORDER — INSULIN STARTER KIT- SYRINGES (ENGLISH): 1.0000 | Freq: Once | Status: DC

## 2014-02-16 MED ORDER — INSULIN ASPART 100 UNIT/ML ~~LOC~~ SOLN
10.0000 [IU] | Freq: Once | SUBCUTANEOUS | Status: AC
  Administered 2014-02-16: 10 [IU] via INTRAVENOUS
  Filled 2014-02-16: qty 1

## 2014-02-16 MED ORDER — INSULIN NPH ISOPHANE & REGULAR (70-30) 100 UNIT/ML ~~LOC~~ SUSP: 30.0000 [IU] | Freq: Two times a day (BID) | SUBCUTANEOUS | Status: DC

## 2014-02-16 MED ORDER — GLUCOSE BLOOD VI STRP: ORAL_STRIP | Status: DC

## 2014-02-16 NOTE — Discharge Instructions (Signed)

## 2014-03-26 ENCOUNTER — Emergency Department (HOSPITAL_COMMUNITY)
Admission: EM | Admit: 2014-03-26 | Discharge: 2014-03-26 | Disposition: A | Discharge status: Home / Self Care | Payer: Self-pay | Attending: Emergency Medicine | Admitting: Emergency Medicine

## 2014-03-26 ENCOUNTER — Emergency Department (HOSPITAL_COMMUNITY): Payer: Self-pay

## 2014-03-26 ENCOUNTER — Encounter (HOSPITAL_COMMUNITY): Payer: Self-pay | Admitting: Emergency Medicine

## 2014-03-26 DIAGNOSIS — Z8701 Personal history of pneumonia (recurrent): Secondary | ICD-10-CM | POA: Insufficient documentation

## 2014-03-26 DIAGNOSIS — Y9301 Activity, walking, marching and hiking: Secondary | ICD-10-CM | POA: Insufficient documentation

## 2014-03-26 DIAGNOSIS — S59902A Unspecified injury of left elbow, initial encounter: Secondary | ICD-10-CM | POA: Insufficient documentation

## 2014-03-26 DIAGNOSIS — Z8639 Personal history of other endocrine, nutritional and metabolic disease: Secondary | ICD-10-CM | POA: Insufficient documentation

## 2014-03-26 DIAGNOSIS — Y92410 Unspecified street and highway as the place of occurrence of the external cause: Secondary | ICD-10-CM | POA: Insufficient documentation

## 2014-03-26 DIAGNOSIS — Z794 Long term (current) use of insulin: Secondary | ICD-10-CM | POA: Insufficient documentation

## 2014-03-26 DIAGNOSIS — I10 Essential (primary) hypertension: Secondary | ICD-10-CM | POA: Insufficient documentation

## 2014-03-26 DIAGNOSIS — R52 Pain, unspecified: Secondary | ICD-10-CM

## 2014-03-26 DIAGNOSIS — E1165 Type 2 diabetes mellitus with hyperglycemia: Secondary | ICD-10-CM | POA: Insufficient documentation

## 2014-03-26 DIAGNOSIS — M79602 Pain in left arm: Secondary | ICD-10-CM

## 2014-03-26 MED ORDER — IBUPROFEN 800 MG PO TABS
800.0000 mg | ORAL_TABLET | Freq: Once | ORAL | Status: AC
  Administered 2014-03-26: 800 mg via ORAL
  Filled 2014-03-26: qty 1

## 2014-03-26 MED ORDER — NAPROXEN 500 MG PO TABS: 500.0000 mg | ORAL_TABLET | Freq: Two times a day (BID) | ORAL | Status: DC

## 2014-03-26 NOTE — Discharge Instructions (Signed)
Please follow the directions provided.  Be sure to follow up with the ortho referral provided if your symptoms persist.  You may take naproxen twice a day for pain.  Wear your sling for comfort. Don't hesitate to return for new, worsening or concerning symptoms.    SEEK IMMEDIATE MEDICAL CARE IF:  You have an increase in bruising, swelling or pain in the area of your injury or surgery.  You notice a blue color of or coldness in your fingers.  Pain relief is not obtained with medications or any of your problems are getting worse.
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
## 2014-03-26 NOTE — ED Notes (Signed)
Patient transported to X-ray 

## 2014-04-19 ENCOUNTER — Ambulatory Visit (INDEPENDENT_AMBULATORY_CARE_PROVIDER_SITE_OTHER): Payer: Self-pay | Admitting: Internal Medicine

## 2014-04-19 ENCOUNTER — Encounter: Payer: Self-pay | Admitting: Internal Medicine

## 2014-04-19 VITALS — BP 123/85 | HR 88 | Temp 97.9°F | Ht 67.0 in | Wt 152.7 lb

## 2014-04-19 DIAGNOSIS — IMO0002 Reserved for concepts with insufficient information to code with codable children: Secondary | ICD-10-CM

## 2014-04-19 DIAGNOSIS — N529 Male erectile dysfunction, unspecified: Secondary | ICD-10-CM

## 2014-04-19 DIAGNOSIS — E1165 Type 2 diabetes mellitus with hyperglycemia: Secondary | ICD-10-CM

## 2014-04-19 DIAGNOSIS — Z794 Long term (current) use of insulin: Secondary | ICD-10-CM

## 2014-04-19 DIAGNOSIS — I10 Essential (primary) hypertension: Secondary | ICD-10-CM

## 2014-04-19 LAB — POCT GLYCOSYLATED HEMOGLOBIN (HGB A1C): Hemoglobin A1C: 12.8

## 2014-04-19 LAB — GLUCOSE, CAPILLARY: Glucose-Capillary: 435 mg/dL — ABNORMAL HIGH (ref 70–99)

## 2014-04-19 MED ORDER — GLUCOSE BLOOD VI STRP: ORAL_STRIP | Status: DC

## 2014-04-19 MED ORDER — INSULIN NPH ISOPHANE & REGULAR (70-30) 100 UNIT/ML ~~LOC~~ SUSP: 30.0000 [IU] | Freq: Two times a day (BID) | SUBCUTANEOUS | Status: DC

## 2014-04-19 MED ORDER — INSULIN STARTER KIT- SYRINGES (ENGLISH): 1.0000 | Freq: Once | Status: DC

## 2014-04-19 MED ORDER — TADALAFIL 5 MG PO TABS: 5.0000 mg | ORAL_TABLET | Freq: Every day | ORAL | Status: DC | PRN

## 2014-04-19 NOTE — Progress Notes (Signed)
Subjective:     Patient ID: [name], male   DOB: 1970/06/12, 43 y.o.   MRN: [id number]  HPI  Mr. Gantner is a 43 yo male with PMHx of Type II DM, HTN, and HLD who presents to the clinic for follow up. Please see problem oriented charting for more information.   Review of Systems  General: Denies fever, chills, fatigue, change in appetite and diaphoresis.  Respiratory: Denies SOB, cough, DOE, chest tightness, and wheezing.   Cardiovascular: Denies chest pain and palpitations.  Gastrointestinal: Denies nausea, vomiting, abdominal pain, diarrhea, constipation, blood in stool and abdominal distention.  Genitourinary: Admits to erectile dysfunction. Denies dysuria, urgency, frequency, hematuria, suprapubic pain and flank pain. Endocrine: Denies hot or cold intolerance, polyuria, and polydipsia. Musculoskeletal: Denies myalgias, back pain, joint swelling, arthralgias and gait problem.  Skin: Denies pallor, rash and wounds.  Neurological: Denies dizziness, headaches, weakness, lightheadedness, numbness,seizures, and syncope, Psychiatric/Behavioral: Admits to increased stress. Denies mood changes, confusion, nervousness, sleep disturbance and agitation.     Objective:   Physical Exam  Filed Vitals:   04/19/14 0953 04/19/14 1101  BP: 151/100 123/85  Pulse: 89 88  Temp: 97.9 F (36.6 C)   TempSrc: Oral   Height: 5\' 7"  (1.702 m)   Weight: 152 lb 11.2 oz (69.264 kg)   SpO2: 100%    General: Vital signs reviewed.  Patient is well-developed and well-nourished, in no acute distress and cooperative with exam.  Cardiovascular: RRR, S1 normal, S2 normal, no murmurs, gallops, or rubs. Pulmonary/Chest: Clear to auscultation bilaterally, no wheezes, rales, or rhonchi. Abdominal: Soft, non-tender, non-distended, BS +, no masses, organomegaly, or guarding present.  Musculoskeletal: No joint deformities, erythema, or stiffness, ROM full and nontender. Extremities: No lower extremity edema  bilaterally,  pulses symmetric and intact bilaterally. No cyanosis or clubbing. Skin: Warm, dry and intact. No rashes or erythema. Psychiatric: Normal mood and affect. speech and behavior is normal. Cognition and memory are normal.     Assessment:         Plan:     Please see problem based assessment and plan.

## 2014-04-19 NOTE — Assessment & Plan Note (Signed)
Assessment: Patient asks my option on using human growth hormone for his erectile dysfunction. This was previously diagnosed in 2014 and he had admitted to problems since his diagnosis of diabetes in 2008.   Plan: I counseled the patient on the risks of using human growth hormone. In fact, the best thing to do would be to better control his diabetes. Patient has used sildenafil in the past which has helped.  -Tadalafil 5 mg prn

## 2014-04-19 NOTE — Assessment & Plan Note (Signed)
BP Readings from Last 3 Encounters:  04/19/14 123/85  03/26/14 118/62  02/16/14 115/67    Lab Results  Component Value Date   NA 135* 02/15/2014   K 4.4 02/15/2014   CREATININE 0.86 02/15/2014    Assessment: Blood pressure control:  At goal Progress toward BP goal:   At goal Comments: 151/100 initially then 123/85 on recheck  Plan: Medications:  continue current medications, not currently on medications Educational resources provided:   Self management tools provided:   Other plans: None

## 2014-04-19 NOTE — Patient Instructions (Signed)
General Instructions:   Please try to bring all your medicines next time. This will help [place] keep you safe from mistakes.  Blood Glucose Monitoring Monitoring your blood glucose (also know as blood sugar) helps you to manage your diabetes. It also helps you and your health care provider monitor your diabetes and determine how well your treatment plan is working. WHY SHOULD YOU MONITOR YOUR BLOOD GLUCOSE?  It can help you understand how food, exercise, and medicine affect your blood glucose.  It allows you to know what your blood glucose is at any given moment. You can quickly tell if you are having low blood glucose (hypoglycemia) or high blood glucose (hyperglycemia).  It can help you and your health care provider know how to adjust your medicines.  It can help you understand how to manage an illness or adjust medicine for exercise. WHEN SHOULD YOU TEST? Your health care provider will help you decide how often you should check your blood glucose. This may depend on the type of diabetes you have, your diabetes control, or the types of medicines you are taking. Be sure to write down all of your blood glucose readings so that this information can be reviewed with your health care provider. See below for examples of testing times that your health care provider may suggest. Type 1 Diabetes  Test 4 times a day if you are in good control, using an insulin pump, or perform multiple daily injections.  If your diabetes is not well controlled or if you are sick, you may need to monitor more often.  It is a good idea to also monitor:  Before and after exercise.  Between meals and 2 hours after a meal.  Occasionally between 2:00 a.m. and 3:00 a.m. Type 2 Diabetes  It can vary with each person, but generally, if you are on insulin, test 4 times a day.  If you take medicines by mouth (orally), test 2 times a day.  If you are on a controlled diet, test once a day.  If your diabetes is not well  controlled or if you are sick, you may need to monitor more often. HOW TO MONITOR YOUR BLOOD GLUCOSE Supplies Needed  Blood glucose meter.  Test strips for your meter. Each meter has its own strips. You must use the strips that go with your own meter.  A pricking needle (lancet).  A device that holds the lancet (lancing device).  A journal or log book to write down your results. Procedure  Wash your hands with soap and water. Alcohol is not preferred.  Prick the side of your finger (not the tip) with the lancet.  Gently milk the finger until a small drop of blood appears.  Follow the instructions that come with your meter for inserting the test strip, applying blood to the strip, and using your blood glucose meter. Other Areas to Get Blood for Testing Some meters allow you to use other areas of your body (other than your finger) to test your blood. These areas are called alternative sites. The most common alternative sites are:  The forearm.  The thigh.  The back area of the lower leg.  The palm of the hand. The blood flow in these areas is slower. Therefore, the blood glucose values you get may be delayed, and the numbers are different from what you would get from your fingers. Do not use alternative sites if you think you are having hypoglycemia. Your reading will not be accurate. Always  use a finger if you are having hypoglycemia. Also, if you cannot feel your lows (hypoglycemia unawareness), always use your fingers for your blood glucose checks. ADDITIONAL TIPS FOR GLUCOSE MONITORING  Do not reuse lancets.  Always carry your supplies with you.  All blood glucose meters have a 24-hour "hotline" number to call if you have questions or need help.  Adjust (calibrate) your blood glucose meter with a control solution after finishing a few boxes of strips. BLOOD GLUCOSE RECORD KEEPING It is a good idea to keep a daily record or log of your blood glucose readings. Most glucose  meters, if not all, keep your glucose records stored in the meter. Some meters come with the ability to download your records to your home computer. Keeping a record of your blood glucose readings is especially helpful if you are wanting to look for patterns. Make notes to go along with the blood glucose readings because you might forget what happened at that exact time. Keeping good records helps you and your health care provider to work together to achieve good diabetes management.  Document Released: 05/09/2003 Document Revised: 09/20/2013 Document Reviewed: 09/28/2012 [facility] Patient Information 2015 [place], [place]. This information is not intended to replace advice given to you by your health care provider. Make sure you discuss any questions you have with your health care provider.

## 2014-04-19 NOTE — Assessment & Plan Note (Addendum)
Lab Results  Component Value Date   HGBA1C 12.8 04/19/2014   HGBA1C >14.0 05/31/2013   HGBA1C >14.0 09/08/2012     Assessment: Diabetes control: Uncontrolled Progress toward A1C goal:   Improved Comments: Patient has not taken his Novolog 70/30 in 1.5 weeks due to recent move and not having a refrigerator. CBG today 435.  Plan: Medications:  continue current medications Home glucose monitoring: Frequency:  twice a day Timing:   before breakfast, before dinner Instruction/counseling given: reminded to bring blood glucose meter & log to each visit, reminded to bring medications to each visit and discussed diet Educational resources provided: brochure, handout Self management tools provided:   Other plans: Patient requests Novolog 70/30 pen and possibility of using an insulin pump in the future. For now, we will prescribe 70/30 30 units BID. I am not aware of Novolog 70/30 pens, only vials. I gave him refills on test strips as well.

## 2014-04-21 NOTE — Progress Notes (Signed)
Internal Medicine Clinic Attending  Date of Visit: 04/19/2014  I saw and evaluated the patient.  I personally confirmed the key portions of the history and exam documented by Dr. Marvel Plan and I reviewed pertinent patient test results.  The assessment, diagnosis, and plan were formulated together and I agree with the documentation in the resident's note.

## 2014-05-23 ENCOUNTER — Ambulatory Visit: Payer: Self-pay | Admitting: Internal Medicine

## 2014-05-25 ENCOUNTER — Encounter: Payer: Self-pay | Admitting: Internal Medicine

## 2014-05-25 ENCOUNTER — Ambulatory Visit (INDEPENDENT_AMBULATORY_CARE_PROVIDER_SITE_OTHER): Payer: Self-pay | Admitting: Internal Medicine

## 2014-05-25 VITALS — BP 144/83 | HR 75 | Temp 97.8°F | Ht 67.0 in | Wt 153.1 lb

## 2014-05-25 DIAGNOSIS — E1165 Type 2 diabetes mellitus with hyperglycemia: Secondary | ICD-10-CM

## 2014-05-25 DIAGNOSIS — IMO0002 Reserved for concepts with insufficient information to code with codable children: Secondary | ICD-10-CM

## 2014-05-25 DIAGNOSIS — I10 Essential (primary) hypertension: Secondary | ICD-10-CM

## 2014-05-25 DIAGNOSIS — Z794 Long term (current) use of insulin: Secondary | ICD-10-CM

## 2014-05-25 DIAGNOSIS — Z9114 Patient's other noncompliance with medication regimen: Secondary | ICD-10-CM

## 2014-05-25 LAB — GLUCOSE, CAPILLARY: Glucose-Capillary: 477 mg/dL — ABNORMAL HIGH (ref 70–99)

## 2014-05-25 MED ORDER — INSULIN NPH ISOPHANE & REGULAR (70-30) 100 UNIT/ML ~~LOC~~ SUSP: 15.0000 [IU] | Freq: Two times a day (BID) | SUBCUTANEOUS | Status: DC

## 2014-05-25 MED ORDER — METFORMIN HCL ER 500 MG PO TB24: 1000.0000 mg | ORAL_TABLET | Freq: Every day | ORAL | Status: DC

## 2014-05-25 NOTE — Assessment & Plan Note (Signed)
BP Readings from Last 3 Encounters:  05/25/14 144/83  04/19/14 123/85  03/26/14 118/62    Lab Results  Component Value Date   NA 135* 02/15/2014   K 4.4 02/15/2014   CREATININE 0.86 02/15/2014    Assessment: Blood pressure control:  mildly elevated Progress toward BP goal:   unchanged Comments:  Plan: Medications: no meds Educational resources provided:   Self management tools provided:   Other plans: will need to continue to monitor closely and likely initate BP medication soon.  [name] has a large component of non compliance and I want to focus today on getting his wildly uncontrolled DM better controlled.
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
## 2014-05-25 NOTE — Patient Instructions (Signed)
General Instructions: Please go to [facility] and pick up your insulin and metformin.  I want you to start taking metformin 500mg  once a day for 1 week, you can then go up to 2 times a day or take 2 pills in the morning.  If sugars remain very high and you have no lows you can increase your insulin to 20units twice a day, but if you have lows call us and drop it down again.    Please bring your medicines with you each time you come to clinic.  Medicines may include prescription medications, over-the-counter medications, herbal remedies, eye drops, vitamins, or other pills.   Progress Toward Treatment Goals:  Treatment Goal 05/31/2013  Hemoglobin A1C unable to assess  Blood pressure at goal    Self Care Goals & Plans:  Self Care Goal 05/25/2014  Manage my medications take my medicines as prescribed; bring my medications to every visit; refill my medications on time  Monitor my health -  Eat healthy foods drink diet soda or water instead of juice or soda; eat more vegetables; eat foods that are low in salt; eat baked foods instead of fried foods; eat fruit for snacks and desserts  Be physically active -    Home Blood Glucose Monitoring 05/31/2013  Check my blood sugar 2 times a day  When to check my blood sugar before breakfast; at bedtime     Care Management & Community Referrals:  Referral 05/31/2013  Referrals made for care management support diabetes educator

## 2014-05-25 NOTE — Progress Notes (Signed)
[facility] Subjective:   Patient ID: [name] male   DOB: 09-24-1970 44 y.o.   MRN: [id number]  HPI: [name] is a 44 y.o. male with a PMH below who presents for follow up please see problem based charting in A&P for further details of HPI. He does report that he got a wound on his right index finger 2 weeks ago.  He did not seek medical care but kept it clean and bandaged.  He reports that it healed well and he has no current complaints.    Past Medical History  Diagnosis Date  . Diabetes mellitus type II, uncontrolled 07/17/2006  . ESSENTIAL HYPERTENSION 07/17/2006  . HYPERLIPIDEMIA 11/24/2008  . Pneumonia 10/22/2011   Current Outpatient Prescriptions  Medication Sig Dispense Refill  . glucose blood test strip Use as instructed 100 each 12  . insulin NPH-regular Human (NOVOLIN 70/30) (70-30) 100 UNIT/ML injection Inject 30 Units into the skin 2 (two) times daily with a meal. 10 mL 0  . insulin starter kit- syringes MISC 1 kit by Other route once. 1 kit 0  . naproxen (NAPROSYN) 500 MG tablet Take 1 tablet (500 mg total) by mouth 2 (two) times daily with a meal. 30 tablet 0  . tadalafil (CIALIS) 5 MG tablet Take 1 tablet (5 mg total) by mouth daily as needed for erectile dysfunction. 10 tablet 0   No current facility-administered medications for this visit.   Family History  Problem Relation Age of Onset  . Hypertension Mother 19  . Cancer Mother     bladder cancer  . Alcohol abuse Father   . Hypertension Brother    History   Social History  . Marital Status: Married    Spouse Name: N/A    Number of Children: N/A  . Years of Education: N/A   Occupational History  . 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 [employer]     Began Jan 2014   Social History Main Topics  . Smoking status: Never Smoker   . Smokeless tobacco: Never Used  . Alcohol Use: No  . Drug Use: No  . Sexual Activity:    Partners:  Female     Comment: Not sexually active since April 2013   Other Topics Concern  . None   Social History Narrative   Lives with his mother and brother.   Wife has had a foot amputation; is a type 1 DM.   Review of Systems: Review of Systems  Constitutional: Negative for fever, chills and malaise/fatigue.  Eyes: Negative for blurred vision.  Respiratory: Negative for cough and shortness of breath.   Cardiovascular: Negative for chest pain.  Gastrointestinal: Negative for heartburn and abdominal pain.  Genitourinary: Positive for frequency.  Neurological: Negative for headaches.  Endo/Heme/Allergies: Positive for polydipsia.  Psychiatric/Behavioral: Negative for depression.     Objective:  Physical Exam: Filed Vitals:   05/25/14 0913  BP: 144/83  Pulse: 75  Temp: 97.8 F (36.6 C)  TempSrc: Oral  Height: 5' 7"  (1.702 m)  Weight: 153 lb 1.6 oz (69.446 kg)  SpO2: 100%  Physical Exam  Constitutional: He appears well-developed and well-nourished.  Cardiovascular: Normal rate, regular rhythm and normal heart sounds.   Pulmonary/Chest: Effort normal and breath sounds normal.  Abdominal: Soft.  Skin:  Well healed laceration of right index finger. No warmth erythema or other signs of infection.  Nursing  note and vitals reviewed.   Assessment & Plan:  Case discussed with Dr. Lynnae January  Essential hypertension BP Readings from Last 3 Encounters:  05/25/14 144/83  04/19/14 123/85  03/26/14 118/62    Lab Results  Component Value Date   NA 135* 02/15/2014   K 4.4 02/15/2014   CREATININE 0.86 02/15/2014    Assessment: Blood pressure control:  mildly elevated Progress toward BP goal:   unchanged Comments:  Plan: Medications: no meds Educational resources provided:   Self management tools provided:   Other plans: will need to continue to monitor closely and likely initate BP medication soon.  He has a large component of non compliance and I want to focus today on  getting his wildly uncontrolled DM better controlled.   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  Interestingly he tells me today that in the past when he takes 30 units of insulin he occasionally has low blood sugars.  He does not remember being prescribed metformin in the past. He does report polyuria and polydipsia.  He denies visual changes.  Assessment: Diabetes control:  poor Progress toward A1C goal:   unchanged Comments: has not picked up or taken insulin in last month.  Plan: Medications:  Will start Metformin 569m ER, instructed to start with 1 pill daily and then go to 2 pills., Instructed to take 70/30 insulin 15u BID Home glucose monitoring: Frequency:   Timing:   Instruction/counseling given: reminded to get eye exam, reminded to bring blood glucose meter & log to each visit, discussed foot care and discussed diet Educational resources provided: brochure Self management tools provided:   Other plans: decreased insulin given his previous low blood sugars although I did give him the option of self increaseing up to 20u BID if his sugars remain high without lows.  Advised to call if he has lows.  He notes he will be on the road a bit for the next few weeks.  I advised him to go today to pick up his insulin which he reports he will. I sent him to our financial counselor to apply for the orange card so we can get a retinal scan.       Medications Ordered Meds ordered this encounter  Medications  . metFORMIN (GLUCOPHAGE XR) 500 MG 24 hr tablet    Sig: Take 2 tablets (1,000 mg total) by mouth daily with breakfast.    Dispense:  60 tablet    Refill:  11  . insulin NPH-regular Human (NOVOLIN 70/30) (70-30) 100 UNIT/ML injection    Sig: Inject 15 Units into the skin 2  (two) times daily with a meal.    Dispense:  10 mL    Refill:  0   Other Orders Orders Placed This Encounter  Procedures  . Glucose, capillary

## 2014-05-27 NOTE — Progress Notes (Signed)
Internal Medicine Clinic Attending  Case discussed with Dr. Hoffman soon after the resident saw the patient.  We reviewed the resident's history and exam and pertinent patient test results.  I agree with the assessment, diagnosis, and plan of care documented in the resident's note. 

## 2014-05-30 ENCOUNTER — Ambulatory Visit: Payer: Self-pay

## 2014-06-03 ENCOUNTER — Ambulatory Visit: Payer: Self-pay

## 2014-06-03 ENCOUNTER — Other Ambulatory Visit: Payer: Self-pay | Admitting: *Deleted

## 2014-06-03 DIAGNOSIS — E1165 Type 2 diabetes mellitus with hyperglycemia: Secondary | ICD-10-CM

## 2014-06-03 DIAGNOSIS — IMO0002 Reserved for concepts with insufficient information to code with codable children: Secondary | ICD-10-CM

## 2014-06-03 MED ORDER — GLUCOSE BLOOD VI STRP
ORAL_STRIP | Status: DC
Start: 2014-06-03 — End: 2015-07-10

## 2014-06-03 MED ORDER — INSULIN STARTER KIT- SYRINGES (ENGLISH): 1.0000 | Freq: Once | Status: DC

## 2014-06-03 NOTE — Telephone Encounter (Signed)
Cindee Salt Refilled insulin 05/25/13

## 2014-06-03 NOTE — Telephone Encounter (Signed)
Will not let me E Rx the insulin starter kit. Pls phone in. Thanks

## 2014-06-03 NOTE — Addendum Note (Signed)
Addended by: Larey Dresser A on: 06/03/2014 04:49 PM   Modules accepted: Orders

## 2014-06-23 ENCOUNTER — Other Ambulatory Visit: Payer: Self-pay | Admitting: *Deleted

## 2014-06-23 DIAGNOSIS — IMO0002 Reserved for concepts with insufficient information to code with codable children: Secondary | ICD-10-CM

## 2014-06-23 DIAGNOSIS — E1165 Type 2 diabetes mellitus with hyperglycemia: Secondary | ICD-10-CM

## 2014-06-23 MED ORDER — INSULIN NPH ISOPHANE & REGULAR (70-30) 100 UNIT/ML ~~LOC~~ SUSP: 15.0000 [IU] | Freq: Two times a day (BID) | SUBCUTANEOUS | Status: DC

## 2014-08-11 ENCOUNTER — Telehealth: Payer: Self-pay | Admitting: Internal Medicine

## 2014-08-11 NOTE — Telephone Encounter (Signed)
Call to patient to confirm appointment for 08/16/14 at 2:15 lmtcb

## 2014-08-16 ENCOUNTER — Encounter: Payer: Self-pay | Admitting: Internal Medicine

## 2014-09-06 ENCOUNTER — Ambulatory Visit (INDEPENDENT_AMBULATORY_CARE_PROVIDER_SITE_OTHER): Payer: Self-pay | Admitting: Internal Medicine

## 2014-09-06 ENCOUNTER — Encounter: Payer: Self-pay | Admitting: Internal Medicine

## 2014-09-06 VITALS — BP 117/77 | HR 99 | Wt 144.6 lb

## 2014-09-06 DIAGNOSIS — E785 Hyperlipidemia, unspecified: Secondary | ICD-10-CM

## 2014-09-06 DIAGNOSIS — I10 Essential (primary) hypertension: Secondary | ICD-10-CM

## 2014-09-06 DIAGNOSIS — E1165 Type 2 diabetes mellitus with hyperglycemia: Secondary | ICD-10-CM

## 2014-09-06 DIAGNOSIS — IMO0002 Reserved for concepts with insufficient information to code with codable children: Secondary | ICD-10-CM

## 2014-09-06 LAB — BASIC METABOLIC PANEL WITH GFR
BUN: 16 mg/dL (ref 6–23)
CO2: 25 mEq/L (ref 19–32)
Calcium: 9.6 mg/dL (ref 8.4–10.5)
Chloride: 98 mEq/L (ref 96–112)
Creat: 0.93 mg/dL (ref 0.50–1.35)
GFR, Est African American: >89 mL/min
GFR, Est Non African American: >89 mL/min
Glucose, Bld: 393 mg/dL — ABNORMAL HIGH (ref 70–99)
Potassium: 4.5 mEq/L (ref 3.5–5.3)
Sodium: 135 mEq/L (ref 135–145)

## 2014-09-06 LAB — LIPID PANEL
Cholesterol: 159 mg/dL (ref 0–200)
HDL: 37 mg/dL — ABNORMAL LOW (ref >=40)
LDL Cholesterol: 84 mg/dL (ref 0–99)
Total CHOL/HDL Ratio: 4.3 Ratio
Triglycerides: 191 mg/dL — ABNORMAL HIGH (ref <150)
VLDL: 38 mg/dL (ref 0–40)

## 2014-09-06 LAB — GLUCOSE, CAPILLARY: Glucose-Capillary: 373 mg/dL — ABNORMAL HIGH (ref 70–99)

## 2014-09-06 LAB — POCT GLYCOSYLATED HEMOGLOBIN (HGB A1C): Hemoglobin A1C: >14

## 2014-09-06 MED ORDER — INSULIN NPH ISOPHANE & REGULAR (70-30) 100 UNIT/ML ~~LOC~~ SUSP: 30.0000 [IU] | Freq: Two times a day (BID) | SUBCUTANEOUS | Status: DC

## 2014-09-06 MED ORDER — INSULIN NPH ISOPHANE & REGULAR (70-30) 100 UNIT/ML ~~LOC~~ SUSP: SUBCUTANEOUS | Status: DC

## 2014-09-06 MED ORDER — INSULIN PEN NEEDLE 31G X 5 MM MISC: 1.0000 | Freq: Two times a day (BID) | Status: DC

## 2014-09-06 NOTE — Patient Instructions (Signed)
It was a pleasure seeing you today, Mr. Angelini.  - Start taking Metformin 500 mg daily for 1 week. Then increase to 2 pills daily. - Continue Novolog 70/30 30 units twice a day with meals - Will set up appointment with Butch Penny to go over nutrition - Follow up appointment in next 2-3 weeks   General Instructions:   Please bring your medicines with you each time you come to clinic.  Medicines may include prescription medications, over-the-counter medications, herbal remedies, eye drops, vitamins, or other pills.   Progress Toward Treatment Goals:  Treatment Goal 05/31/2013  Hemoglobin A1C unable to assess  Blood pressure at goal    Self Care Goals & Plans:  Self Care Goal 09/06/2014  Manage my medications take my medicines as prescribed; bring my medications to every visit; refill my medications on time  Monitor my health keep track of my blood glucose; bring my glucose meter and log to each visit; check my feet daily  Eat healthy foods eat baked foods instead of fried foods; drink diet soda or water instead of juice or soda; eat foods that are low in salt  Be physically active find an activity I enjoy; take a walk every day    Home Blood Glucose Monitoring 05/31/2013  Check my blood sugar 2 times a day  When to check my blood sugar before breakfast; at bedtime     Care Management & Community Referrals:  Referral 05/31/2013  Referrals made for care management support diabetes educator

## 2014-09-06 NOTE — Progress Notes (Signed)
   Subjective:    Patient ID: [name], male    DOB: [birth year], 44 y.o.   MRN: [id number]  HPI [name] is a 44yo man with PMHx of HTN, uncontrolled Type 2 DM, and HLD who presents today for the following:  Type 2 DM: Last HbA1c 12.8 on 04/19/14. Patient was seen 3 months ago for his uncontrolled blood sugars. He was started on Metformin 500 mg daily and recommended to increase to Metformin 1000 mg daily. His 70/30 was also decreased to 15 units BID due to reported hypoglycemia when taking 30 units. However, patient reports he only took Metformin for a few days and stopped. He works for the [employer] and states it is difficult for him to take both insulin and Metformin. He notes he is still taking Novolin 70/30 30 units BID. He states he checks his blood sugars twice daily, but ran out of test strips 2 weeks ago. He reports his blood sugars have been running in the 200's in the AM and 130-140s in the PM. He reports he knows when he gets low blood sugar and has associated sweatiness, weakness, and hunger. He reports only 1 episode of hypoglycemia in the last month. He reports polyuria and polydipsia, but denies blurred vision, abdominal pain, nausea/vomiting.   HTN: BP 117/77 today. He is currently not on any antihypertensives.   HLD: Last lipid profile on 05/31/13 shows Chol 155, Trigly 235, HDL 31, and LDL 77.  He is currently not on any statin therapy.    Review of Systems General: Denies fever, chills, night sweats, changes in weight, changes in appetite HEENT: Denies headaches, ear pain, changes in vision, rhinorrhea, sore throat CV: Denies CP, palpitations, SOB, orthopnea Pulm: Denies SOB, cough, wheezing GI: Denies abdominal pain, nausea, vomiting, diarrhea, constipation, melena, hematochezia GU: Denies dysuria, hematuria, frequency Msk: Denies muscle cramps, joint pains Neuro: Denies weakness, numbness, tingling Skin: Denies rashes, bruising    Objective:   Physical Exam General:  sitting up in chair, NAD HEENT: [place]/AT, EOMI, mucus membranes moist CV: RRR, no m/g/r Pulm: CTA bilaterally, breaths non-labored Abd: BS+, soft, non-tender, non-distended Ext: warm, no edema, moves all Neuro: alert and oriented x 3, no focal deficits      Assessment & Plan:  Please refer to A&P documentation.

## 2014-09-07 LAB — CBC WITH DIFFERENTIAL/PLATELET
Basophils Absolute: 0 10*3/uL (ref 0.0–0.1)
Basophils Relative: 0 % (ref 0–1)
Eosinophils Absolute: 0.1 10*3/uL (ref 0.0–0.7)
Eosinophils Relative: 2 % (ref 0–5)
HCT: 44.5 % (ref 39.0–52.0)
Hemoglobin: 14.4 g/dL (ref 13.0–17.0)
Lymphocytes Relative: 43 % (ref 12–46)
Lymphs Abs: 2.3 10*3/uL (ref 0.7–4.0)
MCH: 27.4 pg (ref 26.0–34.0)
MCHC: 32.4 g/dL (ref 30.0–36.0)
MCV: 84.6 fL (ref 78.0–100.0)
MPV: 9.1 fL (ref 8.6–12.4)
Monocytes Absolute: 0.4 10*3/uL (ref 0.1–1.0)
Monocytes Relative: 8 % (ref 3–12)
Neutro Abs: 2.5 10*3/uL (ref 1.7–7.7)
Neutrophils Relative %: 47 % (ref 43–77)
Platelets: 280 10*3/uL (ref 150–400)
RBC: 5.26 MIL/uL (ref 4.22–5.81)
RDW: 13.7 % (ref 11.5–15.5)
WBC: 5.3 10*3/uL (ref 4.0–10.5)

## 2014-09-07 LAB — MICROALBUMIN / CREATININE URINE RATIO
Creatinine, Urine: 58.9 mg/dL
Microalb Creat Ratio: 11.9 mg/g (ref 0.0–30.0)
Microalb, Ur: 0.7 mg/dL (ref <2.0)

## 2014-09-07 NOTE — Assessment & Plan Note (Signed)
Lab Results  Component Value Date   HGBA1C >14.0 09/06/2014   HGBA1C 12.8 04/19/2014   HGBA1C >14.0 05/31/2013     Assessment: Diabetes control: poor control (HgbA1C >9%) Progress toward A1C goal:  deteriorated Comments: Patient's blood sugar control has continued to worsen. He has difficulty affording medications due to lack of health insurance. Because of this he will run out of his medications and/or not test his blood sugar for several weeks. He has met with Edwena Blow (SW) to get the orange card. However, today he states he lost the application form and needs a new one.   Plan: Medications:  See below Home glucose monitoring: Frequency: 2 times a day Timing: before breakfast, before dinner Instruction/counseling given: reminded to bring blood glucose meter & log to each visit, reminded to bring medications to each visit and discussed diet Educational resources provided: brochure Other plans:  - Restart Metformin 500 mg daily. Patient instructed to start at 500 mg for 1 week and then move up to 1000 mg daily. - Continue 70/30 30 units BID. Patient given 70/30 pen sample (see below).  - Instructed to test blood sugars 3 times daily for next 1 week - f/u appointment in 1 week to assess blood sugars and adjust insulin regimen as needed - Check urine microalbumin today - Referral to Butch Penny for diabetes education and nutrition - Referral for retinal eye scan with Butch Penny   Medication Samples have been provided to the patient.  Drug name: Novolin 70/30 flexpen    Qty: 1 LOT: [id number] Exp.Date: 12/2014  The patient has been instructed regarding the correct time, dose, and frequency of taking this medication, including desired effects and most common side effects.   Albin Felling 8:46 PM 09/07/2014

## 2014-09-07 NOTE — Assessment & Plan Note (Signed)
Patient's 10 yr ASCVD risk is 5.6%. He should be on at least a moderate intensity statin. - Check lipid profile today - Discuss starting statin at next visit

## 2014-09-07 NOTE — Assessment & Plan Note (Signed)
BP Readings from Last 3 Encounters:  09/06/14 117/77  05/25/14 144/83  04/19/14 123/85    Lab Results  Component Value Date   NA 135 09/06/2014   K 4.5 09/06/2014   CREATININE 0.93 09/06/2014    Assessment: Blood pressure control: controlled Progress toward BP goal:  at goal Comments: BP controlled.  Plan: Medications:  No current medications Educational resources provided: brochure Self management tools provided: home blood pressure logbook Other plans:  - BP well controlled today. Will consider starting ACE if urine microalbumin elevated - Continue to monitor

## 2014-09-08 NOTE — Progress Notes (Signed)
INTERNAL MEDICINE TEACHING ATTENDING ADDENDUM - Toriann Spadoni, MD: I reviewed and discussed at the time of visit with the resident Dr. Rivet, the patient's medical history, physical examination, diagnosis and results of pertinent tests and treatment and I agree with the patient's care as documented.  

## 2014-10-03 ENCOUNTER — Ambulatory Visit: Payer: Self-pay | Admitting: Dietician

## 2014-10-13 NOTE — Addendum Note (Signed)
Addended by: Hulan Fray on: 10/13/2014 09:39 PM   Modules accepted: Orders

## 2015-01-24 ENCOUNTER — Encounter: Payer: Self-pay | Admitting: Internal Medicine

## 2015-01-24 NOTE — Addendum Note (Signed)
Addended by: Marcelino Duster on: 01/24/2015 01:41 PM   Modules accepted: Orders

## 2015-03-15 ENCOUNTER — Telehealth: Payer: Self-pay | Admitting: Dietician

## 2015-03-15 NOTE — Telephone Encounter (Signed)
Call to patient about scheduling an appointment with his doctor.

## 2015-03-22 ENCOUNTER — Encounter: Payer: Self-pay | Admitting: Internal Medicine

## 2015-07-08 ENCOUNTER — Inpatient Hospital Stay (HOSPITAL_COMMUNITY)
Admission: EM | Admit: 2015-07-08 | Discharge: 2015-07-10 | DRG: 637 | Disposition: A | Discharge status: Home / Self Care | Payer: No Typology Code available for payment source | Attending: Student in an Organized Health Care Education/Training Program | Admitting: Student in an Organized Health Care Education/Training Program

## 2015-07-08 ENCOUNTER — Emergency Department (HOSPITAL_COMMUNITY): Payer: No Typology Code available for payment source

## 2015-07-08 ENCOUNTER — Encounter (HOSPITAL_COMMUNITY): Payer: Self-pay | Admitting: Emergency Medicine

## 2015-07-08 DIAGNOSIS — E785 Hyperlipidemia, unspecified: Secondary | ICD-10-CM | POA: Diagnosis present

## 2015-07-08 DIAGNOSIS — I10 Essential (primary) hypertension: Secondary | ICD-10-CM | POA: Diagnosis present

## 2015-07-08 DIAGNOSIS — R739 Hyperglycemia, unspecified: Secondary | ICD-10-CM

## 2015-07-08 DIAGNOSIS — A419 Sepsis, unspecified organism: Secondary | ICD-10-CM | POA: Diagnosis present

## 2015-07-08 DIAGNOSIS — E1165 Type 2 diabetes mellitus with hyperglycemia: Principal | ICD-10-CM | POA: Diagnosis present

## 2015-07-08 DIAGNOSIS — Z794 Long term (current) use of insulin: Secondary | ICD-10-CM

## 2015-07-08 DIAGNOSIS — Z811 Family history of alcohol abuse and dependence: Secondary | ICD-10-CM

## 2015-07-08 DIAGNOSIS — Z8249 Family history of ischemic heart disease and other diseases of the circulatory system: Secondary | ICD-10-CM

## 2015-07-08 DIAGNOSIS — R509 Fever, unspecified: Secondary | ICD-10-CM | POA: Diagnosis present

## 2015-07-08 DIAGNOSIS — J101 Influenza due to other identified influenza virus with other respiratory manifestations: Secondary | ICD-10-CM

## 2015-07-08 DIAGNOSIS — E860 Dehydration: Secondary | ICD-10-CM

## 2015-07-08 DIAGNOSIS — Z9114 Patient's other noncompliance with medication regimen: Secondary | ICD-10-CM

## 2015-07-08 DIAGNOSIS — Z8052 Family history of malignant neoplasm of bladder: Secondary | ICD-10-CM

## 2015-07-08 DIAGNOSIS — IMO0002 Reserved for concepts with insufficient information to code with codable children: Secondary | ICD-10-CM

## 2015-07-08 DIAGNOSIS — E1142 Type 2 diabetes mellitus with diabetic polyneuropathy: Secondary | ICD-10-CM | POA: Diagnosis present

## 2015-07-08 DIAGNOSIS — E11649 Type 2 diabetes mellitus with hypoglycemia without coma: Secondary | ICD-10-CM | POA: Diagnosis present

## 2015-07-08 LAB — CBC WITH DIFFERENTIAL/PLATELET
Basophils Absolute: 0 10*3/uL (ref 0.0–0.1)
Basophils Relative: 0 %
Eosinophils Absolute: 0 10*3/uL (ref 0.0–0.7)
Eosinophils Relative: 0 %
HCT: 40.6 % (ref 39.0–52.0)
Hemoglobin: 14 g/dL (ref 13.0–17.0)
Lymphocytes Relative: 12 %
Lymphs Abs: 0.9 10*3/uL (ref 0.7–4.0)
MCH: 28.1 pg (ref 26.0–34.0)
MCHC: 34.5 g/dL (ref 30.0–36.0)
MCV: 81.4 fL (ref 78.0–100.0)
Monocytes Absolute: 1 10*3/uL (ref 0.1–1.0)
Monocytes Relative: 13 %
Neutro Abs: 5.5 10*3/uL (ref 1.7–7.7)
Neutrophils Relative %: 75 %
Platelets: 269 10*3/uL (ref 150–400)
RBC: 4.99 MIL/uL (ref 4.22–5.81)
RDW: 12.2 % (ref 11.5–15.5)
WBC: 7.4 10*3/uL (ref 4.0–10.5)

## 2015-07-08 LAB — COMPREHENSIVE METABOLIC PANEL
ALT: 18 U/L (ref 17–63)
AST: 24 U/L (ref 15–41)
Albumin: 3.7 g/dL (ref 3.5–5.0)
Alkaline Phosphatase: 85 U/L (ref 38–126)
Anion gap: 13 (ref 5–15)
BUN: 11 mg/dL (ref 6–20)
CO2: 23 mmol/L (ref 22–32)
Calcium: 8.5 mg/dL — ABNORMAL LOW (ref 8.9–10.3)
Chloride: 93 mmol/L — ABNORMAL LOW (ref 101–111)
Creatinine, Ser: 0.9 mg/dL (ref 0.61–1.24)
GFR calc Af Amer: >60 mL/min (ref >60)
GFR calc non Af Amer: >60 mL/min (ref >60)
Glucose, Bld: 434 mg/dL — ABNORMAL HIGH (ref 65–99)
Potassium: 3.6 mmol/L (ref 3.5–5.1)
Sodium: 129 mmol/L — ABNORMAL LOW (ref 135–145)
Total Bilirubin: 0.8 mg/dL (ref 0.3–1.2)
Total Protein: 7.7 g/dL (ref 6.5–8.1)

## 2015-07-08 LAB — CBG MONITORING, ED: Glucose-Capillary: 241 mg/dL — ABNORMAL HIGH (ref 65–99)

## 2015-07-08 LAB — URINE MICROSCOPIC-ADD ON: Squamous Epithelial / LPF: NONE SEEN

## 2015-07-08 LAB — INFLUENZA PANEL BY PCR (TYPE A & B)
H1N1 flu by pcr: DETECTED — AB
Influenza A By PCR: POSITIVE — AB
Influenza B By PCR: NEGATIVE

## 2015-07-08 LAB — URINALYSIS, ROUTINE W REFLEX MICROSCOPIC
Bilirubin Urine: NEGATIVE
Glucose, UA: >1000 mg/dL — AB
Ketones, ur: NEGATIVE mg/dL
Leukocytes, UA: NEGATIVE
Nitrite: NEGATIVE
Protein, ur: NEGATIVE mg/dL
Specific Gravity, Urine: 1.042 — ABNORMAL HIGH (ref 1.005–1.030)
pH: 5.5 (ref 5.0–8.0)

## 2015-07-08 LAB — I-STAT CG4 LACTIC ACID, ED
Lactic Acid, Venous: 3.61 mmol/L (ref 0.5–2.0)
Lactic Acid, Venous: 1.22 mmol/L (ref 0.5–2.0)

## 2015-07-08 MED ORDER — PIPERACILLIN-TAZOBACTAM 3.375 G IVPB 30 MIN
3.3750 g | Freq: Once | INTRAVENOUS | Status: AC
  Administered 2015-07-08: 3.375 g via INTRAVENOUS
  Filled 2015-07-08: qty 50

## 2015-07-08 MED ORDER — OSELTAMIVIR PHOSPHATE 75 MG PO CAPS
75.0000 mg | ORAL_CAPSULE | Freq: Two times a day (BID) | ORAL | Status: DC
  Administered 2015-07-08 – 2015-07-10 (×3): 75 mg via ORAL
  Filled 2015-07-08 (×7): qty 1

## 2015-07-08 MED ORDER — VANCOMYCIN HCL IN DEXTROSE 750-5 MG/150ML-% IV SOLN
750.0000 mg | Freq: Three times a day (TID) | INTRAVENOUS | Status: DC
  Administered 2015-07-08 – 2015-07-09 (×2): 750 mg via INTRAVENOUS
  Filled 2015-07-08 (×4): qty 150

## 2015-07-08 MED ORDER — VANCOMYCIN HCL IN DEXTROSE 1-5 GM/200ML-% IV SOLN
1000.0000 mg | Freq: Once | INTRAVENOUS | Status: AC
  Administered 2015-07-08: 1000 mg via INTRAVENOUS
  Filled 2015-07-08: qty 200

## 2015-07-08 MED ORDER — SODIUM CHLORIDE 0.9 % IV BOLUS (SEPSIS)
1000.0000 mL | INTRAVENOUS | Status: AC
  Administered 2015-07-08 (×2): 1000 mL via INTRAVENOUS

## 2015-07-08 MED ORDER — ACETAMINOPHEN 325 MG PO TABS: 650.0000 mg | ORAL_TABLET | Freq: Once | ORAL | Status: DC

## 2015-07-08 MED ORDER — OSELTAMIVIR PHOSPHATE 75 MG PO CAPS
75.0000 mg | ORAL_CAPSULE | Freq: Once | ORAL | Status: AC
  Administered 2015-07-08: 75 mg via ORAL
  Filled 2015-07-08: qty 1

## 2015-07-08 MED ORDER — ACETAMINOPHEN 500 MG PO TABS
1000.0000 mg | ORAL_TABLET | Freq: Once | ORAL | Status: AC
  Administered 2015-07-08: 1000 mg via ORAL
  Filled 2015-07-08: qty 2

## 2015-07-08 MED ORDER — ACETAMINOPHEN 325 MG PO TABS
650.0000 mg | ORAL_TABLET | Freq: Once | ORAL | Status: AC
  Administered 2015-07-08: 650 mg via ORAL
  Filled 2015-07-08: qty 2

## 2015-07-08 MED ORDER — SODIUM CHLORIDE 0.9 % IV BOLUS (SEPSIS)
500.0000 mL | INTRAVENOUS | Status: AC
  Administered 2015-07-08: 500 mL via INTRAVENOUS

## 2015-07-08 MED ORDER — PIPERACILLIN-TAZOBACTAM 3.375 G IVPB
3.3750 g | Freq: Three times a day (TID) | INTRAVENOUS | Status: DC
  Administered 2015-07-08 – 2015-07-09 (×2): 3.375 g via INTRAVENOUS
  Filled 2015-07-08 (×4): qty 50

## 2015-07-08 NOTE — ED Provider Notes (Signed)
CSN: [id number]     Arrival date & time 07/08/15  1010 History   First MD Initiated Contact with Patient 07/08/15 1039     Chief Complaint  Patient presents with  . Fever  . Hyperglycemia     (Consider location/radiation/quality/duration/timing/severity/associated sxs/prior Treatment) HPI   [name] is a 45 y.o. male who presents for evaluation of fever, cough, vomiting, for 2 days. Emesis contains food particles. It is yellow in color. Cough is nonproductive. He did not have a flu immunization this year. He also complains of earache, sore throat, rhinorrhea and generalized muscle achiness. He has been prescribed insulin, but has not taken it, for at least 2 months. He works for the [employer], in [employer]. There are no other known modifying factors.   Past Medical History  Diagnosis Date  . Diabetes mellitus type II, uncontrolled ([place]) 07/17/2006  . ESSENTIAL HYPERTENSION 07/17/2006  . HYPERLIPIDEMIA 11/24/2008  . Pneumonia 10/22/2011   Past Surgical History  Procedure Laterality Date  . Arm surgery     Family History  Problem Relation Age of Onset  . Hypertension Mother 75  . Cancer Mother     bladder cancer  . Alcohol abuse Father   . Hypertension Brother    Social History  Substance Use Topics  . Smoking status: Never Smoker   . Smokeless tobacco: Never Used  . Alcohol Use: No    Review of Systems  All other systems reviewed and are negative.     Allergies  Sulfa antibiotics; Bactrim; and Cephalexin  Home Medications   Prior to Admission medications   Medication Sig Start Date End Date Taking? Authorizing Provider  acetaminophen (TYLENOL) 325 MG tablet Take 650 mg by mouth every 6 (six) hours as needed for fever or headache.   Yes Historical Provider, MD  glucose blood test strip Use to check blood sugar 3 to 4 times daily. 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 Insulin dependent Patient not taking: Reported on 09/06/2014 06/03/14   Bartholomew Crews, MD  insulin  NPH-regular Human (NOVOLIN 70/30) (70-30) 100 UNIT/ML injection Inject 30 Units into the skin 2 (two) times daily with a meal. Patient not taking: Reported on 07/08/2015 09/06/14   Sindy Guadeloupe Rivet, MD  Insulin Pen Needle (EASY COMFORT PEN NEEDLES) 31G X 5 MM MISC 1 Dose by Does not apply route 2 (two) times daily before a meal. Use 1 pen needle for each injection daily. Patient not taking: Reported on 07/08/2015 09/06/14   Juliet Rude, MD  insulin starter kit- syringes MISC 1 kit by Other route once. Use to inject insulin 2 times daily. 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 Insulin dependent Patient not taking: Reported on 07/08/2015 06/03/14   Bartholomew Crews, MD  naproxen (NAPROSYN) 500 MG tablet Take 1 tablet (500 mg total) by mouth 2 (two) times daily with a meal. Patient not taking: Reported on 07/08/2015 03/26/14   Britt Bottom, NP  tadalafil (CIALIS) 5 MG tablet Take 1 tablet (5 mg total) by mouth daily as needed for erectile dysfunction. Patient not taking: Reported on 07/08/2015 04/19/14   Alexa Sherral Hammers, MD   BP 122/75 mmHg  Pulse 90  Temp(Src) 101.6 F (38.7 C) (Oral)  Resp 18  Ht 5' 6"  (1.676 m)  Wt 144 lb (65.318 kg)  BMI 23.25 kg/m2  SpO2 98% Physical Exam  Constitutional: He is oriented to person, place, and time. He appears well-developed and well-nourished.  He is febrile  HENT:  Head: Normocephalic and atraumatic.  Right  Ear: External ear normal.  Left Ear: External ear normal.  Eyes: Conjunctivae and EOM are normal. Pupils are equal, round, and reactive to light.  Neck: Normal range of motion and phonation normal. Neck supple.  Cardiovascular: Normal rate, regular rhythm and normal heart sounds.   Pulmonary/Chest: Effort normal and breath sounds normal. He exhibits no bony tenderness.  Capillary refill less than 2 seconds  Abdominal: Soft. There is no tenderness.  Hyperactive bowel sounds. No abdominal tenderness.  Musculoskeletal: Normal range of motion.  Neurological: He is  alert and oriented to person, place, and time. No cranial nerve deficit or sensory deficit. He exhibits normal muscle tone. Coordination normal.  Skin: Skin is warm, dry and intact. No rash noted.  Psychiatric: He has a normal mood and affect. His behavior is normal. Judgment and thought content normal.  Nursing note and vitals reviewed.   ED Course  Procedures (including critical care time) Medications  piperacillin-tazobactam (ZOSYN) IVPB 3.375 g (not administered)  vancomycin (VANCOCIN) IVPB 750 mg/150 ml premix (not administered)  oseltamivir (TAMIFLU) capsule 75 mg (75 mg Oral Given 07/08/15 1145)  acetaminophen (TYLENOL) tablet 1,000 mg (1,000 mg Oral Given 07/08/15 1145)  sodium chloride 0.9 % bolus 1,000 mL (0 mLs Intravenous Stopped 07/08/15 1535)    Followed by  sodium chloride 0.9 % bolus 500 mL (0 mLs Intravenous Stopped 07/08/15 1535)  piperacillin-tazobactam (ZOSYN) IVPB 3.375 g (0 g Intravenous Stopped 07/08/15 1324)  vancomycin (VANCOCIN) IVPB 1000 mg/200 mL premix (0 mg Intravenous Stopped 07/08/15 1333)    Patient Vitals for the past 24 hrs:  BP Temp Temp src Pulse Resp SpO2 Height Weight  07/08/15 1443 - 101.6 F (38.7 C) Oral - - - - -  07/08/15 1430 122/75 mmHg - - 90 - 98 % - -  07/08/15 1415 - - - 92 - 95 % - -  07/08/15 1400 114/72 mmHg - - 88 - 96 % - -  07/08/15 1358 126/80 mmHg - - 100 18 97 % - -  07/08/15 1345 - - - 91 - 97 % - -  07/08/15 1330 126/80 mmHg - - 95 - 96 % - -  07/08/15 1315 - - - 91 - 94 % - -  07/08/15 1300 115/75 mmHg - - 95 - 93 % - -  07/08/15 1235 - - - - - - 5' 6"  (1.676 m) 144 lb (65.318 kg)  07/08/15 1027 157/91 mmHg 102.9 F (39.4 C) Oral 104 22 99 % - -    12:24 PM Reevaluation with update and discussion. After initial assessment and treatment, an updated evaluation reveals no change in clinical status. Patient updated on findings. We'll initiate empiric treatment for undifferentiated sepsis, with IV fluid, 30 cc/kg, and IV  antibiotics. [name] L   3:56 PM-Consult complete with on-call resident. Patient case explained and discussed. She agrees to admit patient for further evaluation and treatment. Call ended at 16:20  CRITICAL CARE Performed by: Richarda Blade Total critical care time: 40 minutes Critical care time was exclusive of separately billable procedures and treating other patients. Critical care was necessary to treat or prevent imminent or life-threatening deterioration. Critical care was time spent personally by me on the following activities: development of treatment plan with patient and/or surrogate as well as nursing, discussions with consultants, evaluation of patient's response to treatment, examination of patient, obtaining history from patient or surrogate, ordering and performing treatments and interventions, ordering and review of laboratory studies, ordering and review of radiographic studies,  pulse oximetry and re-evaluation of patient's condition.   Labs Review Labs Reviewed  COMPREHENSIVE METABOLIC PANEL - Abnormal; Notable for the following:    Sodium 129 (*)    Chloride 93 (*)    Glucose, Bld 434 (*)    Calcium 8.5 (*)    All other components within normal limits  URINALYSIS, ROUTINE W REFLEX MICROSCOPIC (NOT AT [facility]) - Abnormal; Notable for the following:    Specific Gravity, Urine 1.042 (*)    Glucose, UA >1000 (*)    Hgb urine dipstick SMALL (*)    All other components within normal limits  URINE MICROSCOPIC-ADD ON - Abnormal; Notable for the following:    Bacteria, UA RARE (*)    All other components within normal limits  I-STAT CG4 LACTIC ACID, ED - Abnormal; Notable for the following:    Lactic Acid, Venous 3.61 (*)    All other components within normal limits  CBG MONITORING, ED - Abnormal; Notable for the following:    Glucose-Capillary 241 (*)    All other components within normal limits  CULTURE, BLOOD (ROUTINE X 2)  CULTURE, BLOOD (ROUTINE X 2)  URINE  CULTURE  CBC WITH DIFFERENTIAL/PLATELET  INFLUENZA PANEL BY PCR (TYPE A & B, H1N1)  I-STAT CG4 LACTIC ACID, ED    Imaging Review Dg Chest 2 View  07/08/2015  CLINICAL DATA:  Cough and high fever, diabetes EXAM: CHEST  2 VIEW COMPARISON:  09/11/2012 FINDINGS: The heart size and mediastinal contours are within normal limits. Both lungs are clear. The visualized skeletal structures are unremarkable. IMPRESSION: No active cardiopulmonary disease. Electronically Signed   By: Jerilynn Mages.  Shick M.D.   On: 07/08/2015 11:12   I have personally reviewed and evaluated these images and lab results as part of my medical decision-making.   EKG Interpretation None      MDM   Final diagnoses:  Febrile illness, acute  Dehydration  Hyperglycemia  Noncompliance with medications    Febrile illness, likely viral./Seasonal influenza. Chest x-ray does not indicate acute pneumonia. Urinalysis, with elevated specific gravity, but no markers for infection. Lactic acid elevated, but cleared with IV fluid treatment. Glucose elevated, with anion gap, 13. CBG improved after IV fluid treatment. Patient with moderate dehydration evidenced by specific gravity 0.04. 2. Noncompliance with medication treatment, no insulin for at least 2 months. He states that he has no means to get his insulin.  Nursing Notes Reviewed/ Care Coordinated, and agree without changes. Applicable Imaging Reviewed.  Interpretation of Laboratory Data incorporated into ED treatment  Plan:- Admit, transferred to [facility] hospital  Daleen Bo, MD 07/08/15 1625

## 2015-07-08 NOTE — Progress Notes (Signed)
Pharmacy Antibiotic Note  [name] is a 45 y.o. male admitted on 07/08/2015 with sepsis.  Pharmacy has been consulted for Vancomycin & Zosyn dosing.  Tamiflu also given. Renal function is at patient's baseline (est CrCl>80).  Plan: Vancomycin 750 IV every 8 hours.  Goal trough 15-20 mcg/mL. Zosyn 3.375g IV q8h (4 hour infusion).  Tamiflu per MD Check Vancomycin trough at steady state Monitor renal function and cx data    Height: 5\' 6"  (167.6 cm) Weight: 144 lb (65.318 kg) IBW/kg (Calculated) : 63.8  Temp (24hrs), Avg:102.9 F (39.4 C), Min:102.9 F (39.4 C), Max:102.9 F (39.4 C)   Recent Labs Lab 07/08/15 1055 07/08/15 1154  WBC 7.4  --   CREATININE 0.90  --   LATICACIDVEN  --  3.61*    Estimated Creatinine Clearance: 96.5 mL/min (by C-G formula based on Cr of 0.9).    Allergies  Allergen Reactions  . Sulfa Antibiotics Rash    Severe rash. Do not give sulfa medications  . Bactrim [Sulfamethoxazole-Trimethoprim]     Rash  . Cephalexin     Patient with severe drug reaction including exfoliating skin rash and hypotension after being prescribed both cephalexin and sulfamethoxazole-trimethoprim simultaneously. Favor SMX as much more likely culprit as patient had tolerated other cephalosporins in the past, but cannot say with complete certainty that this was not related to cephalexin.     Antimicrobials this admission: Zosyn 2/18 >>  Vancomycin 2/18 >>   Dose adjustments this admission:  Microbiology results: 2/18 BCx: sent 2/18 UCx:    Thank you for allowing pharmacy to be a part of this patient's care.  Biagio Borg 07/08/2015 12:52 PM

## 2015-07-08 NOTE — ED Notes (Signed)
Per Dr Eulis Foster, pt may eat a carb mod meal. Secretary ordered for pt

## 2015-07-08 NOTE — ED Notes (Signed)
Lactic Acid = 3.61, MD Eulis Foster notified

## 2015-07-08 NOTE — ED Notes (Addendum)
Pt from home via EMS, c/o fever and cough x2 days. Pt also adds that his CBG has been high. Pt is A&O, ambulating around room at this time. Pt adds that he is Type II with insulin, but sts that he is non-compliant with meds x2 months. Pt tool Tylenol at 7am

## 2015-07-08 NOTE — ED Notes (Signed)
Bed: [id number] Expected date: 07/08/15 Expected time: 10:04 AM Means of arrival: Ambulance Comments: Fever, CBG 407

## 2015-07-09 DIAGNOSIS — E785 Hyperlipidemia, unspecified: Secondary | ICD-10-CM

## 2015-07-09 DIAGNOSIS — I10 Essential (primary) hypertension: Secondary | ICD-10-CM

## 2015-07-09 DIAGNOSIS — Z9114 Patient's other noncompliance with medication regimen: Secondary | ICD-10-CM

## 2015-07-09 LAB — CBC
HCT: 37.2 % — ABNORMAL LOW (ref 39.0–52.0)
Hemoglobin: 12.4 g/dL — ABNORMAL LOW (ref 13.0–17.0)
MCH: 27.1 pg (ref 26.0–34.0)
MCHC: 33.3 g/dL (ref 30.0–36.0)
MCV: 81.2 fL (ref 78.0–100.0)
Platelets: 222 10*3/uL (ref 150–400)
RBC: 4.58 MIL/uL (ref 4.22–5.81)
RDW: 12.2 % (ref 11.5–15.5)
WBC: 4.9 10*3/uL (ref 4.0–10.5)

## 2015-07-09 LAB — BASIC METABOLIC PANEL
Anion gap: 12 (ref 5–15)
BUN: 10 mg/dL (ref 6–20)
CO2: 24 mmol/L (ref 22–32)
Calcium: 8.2 mg/dL — ABNORMAL LOW (ref 8.9–10.3)
Chloride: 96 mmol/L — ABNORMAL LOW (ref 101–111)
Creatinine, Ser: 0.84 mg/dL (ref 0.61–1.24)
GFR calc Af Amer: >60 mL/min (ref >60)
GFR calc non Af Amer: >60 mL/min (ref >60)
Glucose, Bld: 315 mg/dL — ABNORMAL HIGH (ref 65–99)
Potassium: 3.7 mmol/L (ref 3.5–5.1)
Sodium: 132 mmol/L — ABNORMAL LOW (ref 135–145)

## 2015-07-09 LAB — URINE CULTURE: Culture: NO GROWTH

## 2015-07-09 LAB — GLUCOSE, CAPILLARY
Glucose-Capillary: 268 mg/dL — ABNORMAL HIGH (ref 65–99)
Glucose-Capillary: 260 mg/dL — ABNORMAL HIGH (ref 65–99)

## 2015-07-09 LAB — CBG MONITORING, ED: Glucose-Capillary: 252 mg/dL — ABNORMAL HIGH (ref 65–99)

## 2015-07-09 MED ORDER — SODIUM CHLORIDE 0.9 % IV SOLN
INTRAVENOUS | Status: AC
  Administered 2015-07-09: 06:00:00 via INTRAVENOUS

## 2015-07-09 MED ORDER — INSULIN ASPART 100 UNIT/ML ~~LOC~~ SOLN
2.0000 [IU] | Freq: Once | SUBCUTANEOUS | Status: AC
  Administered 2015-07-09: 2 [IU] via SUBCUTANEOUS
  Filled 2015-07-09: qty 1

## 2015-07-09 MED ORDER — INSULIN ASPART 100 UNIT/ML ~~LOC~~ SOLN
0.0000 [IU] | Freq: Three times a day (TID) | SUBCUTANEOUS | Status: DC
  Administered 2015-07-09 – 2015-07-10 (×3): 5 [IU] via SUBCUTANEOUS

## 2015-07-09 MED ORDER — INSULIN ASPART 100 UNIT/ML ~~LOC~~ SOLN
0.0000 [IU] | Freq: Three times a day (TID) | SUBCUTANEOUS | Status: DC
  Administered 2015-07-09: 8 [IU] via SUBCUTANEOUS

## 2015-07-09 MED ORDER — ENOXAPARIN SODIUM 40 MG/0.4ML ~~LOC~~ SOLN
40.0000 mg | SUBCUTANEOUS | Status: DC
  Administered 2015-07-09: 40 mg via SUBCUTANEOUS
  Filled 2015-07-09 (×2): qty 0.4

## 2015-07-09 MED ORDER — INSULIN ASPART PROT & ASPART (70-30 MIX) 100 UNIT/ML ~~LOC~~ SUSP
15.0000 [IU] | Freq: Two times a day (BID) | SUBCUTANEOUS | Status: DC
  Administered 2015-07-09: 15 [IU] via SUBCUTANEOUS
  Filled 2015-07-09: qty 10

## 2015-07-09 NOTE — ED Notes (Signed)
Spoke to constance at [facility] they are not able to currently take report or pt due to a influx of admits. They will need some time to call us back. Needs approx 30 mins between pts.

## 2015-07-09 NOTE — ED Notes (Signed)
Called 5n to give report nurse not able to take report , they will call back.

## 2015-07-09 NOTE — ED Notes (Signed)
Spoke to internal med informed of pts CBG level and not order for insulin.

## 2015-07-09 NOTE — H&P (Signed)
Date: 07/09/2015               Patient Name:  [name] MRN: [id number]  DOB: 1970-10-02 Age / Sex: 45 y.o., male   PCP: Juliet Rude, MD         Medical Service: Internal Medicine Teaching Service         Attending Physician: Dr. Oval Linsey, MD    First Contact: Dr. Maryellen Pile Pager: [phone]  Second Contact: Dr. Charlott Rakes Pager: [phone]       After Hours (After 5p/  First Contact Pager: [phone]  weekends / holidays): Second Contact Pager: [phone]   Chief Complaint: Fever, Nausea/Vomiting  History of Present Illness: [name] is a 45 year old male with a past medical history of poorly controlled DM II, HTN, HLD presenting with fever and vomiting for 2 days. He reports that he did not feel well for 2 days prior to coming to the ED with nausea, vomiting, fever and cough. He reports checking his temperature at home and it was 102 F. Vomited twice and reports it was red in color but no gross blood. Non-productive cough during this time. Denies any chills, diarrhea, sore throat, or body aches. Did note to ED physician he was having body aches.  He does report a headache. Did not receive the flu vaccine this year. Reports sick contacts at home with similar symptoms. Has had poor PO intake for the past 2 days due to the N/V.   Meds: Current Facility-Administered Medications  Medication Dose Route Frequency Provider Last Rate Last Dose  . 0.9 %  sodium chloride infusion   Intravenous STAT Daleen Bo, MD 125 mL/hr at 07/09/15 0541    . insulin aspart (novoLOG) injection 0-15 Units  0-15 Units Subcutaneous TID WC Rushil Sherrye Payor, MD      . oseltamivir (TAMIFLU) capsule 75 mg  75 mg Oral BID Oval Linsey, MD   75 mg at 07/08/15 1948    Allergies: Allergies as of 07/08/2015 - Review Complete 07/08/2015  Allergen Reaction Noted  . Sulfa antibiotics Rash 09/10/2012  . Bactrim [sulfamethoxazole-trimethoprim]  09/08/2012  . Cephalexin  09/11/2012   Past Medical History    Diagnosis Date  . Diabetes mellitus type II, uncontrolled ([place]) 07/17/2006  . ESSENTIAL HYPERTENSION 07/17/2006  . HYPERLIPIDEMIA 11/24/2008  . Pneumonia 10/22/2011   Past Surgical History  Procedure Laterality Date  . Arm surgery     Family History  Problem Relation Age of Onset  . Hypertension Mother 51  . Cancer Mother     bladder cancer  . Alcohol abuse Father   . Hypertension Brother    Social History   Social History  . Marital Status: Married    Spouse Name: N/A  . Number of Children: N/A  . Years of Education: N/A   Occupational History  . 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 [employer]     Began Jan 2014   Social History Main Topics  . Smoking status: Never Smoker   . Smokeless tobacco: Never Used  . Alcohol Use: No  . Drug Use: No  . Sexual Activity:    Partners: Female     Comment: Not sexually active since April 2013   Other Topics Concern  . Not on file   Social History Narrative   Lives with his mother and brother.   Wife has had a foot  amputation; is a type 1 DM.    Review of Systems: Negative except noted in HPI  Physical Exam: Blood pressure 111/64, pulse 87, temperature 99.1 F (37.3 C), temperature source Oral, resp. rate 18, height 5\' 6"  (1.676 m), weight 144 lb (65.318 kg), SpO2 97 %. General: alert, well-developed, and cooperative to examination.  Head: normocephalic and atraumatic.  Eyes: vision grossly intact, pupils equal, pupils round, pupils reactive to light, no injection and anicteric.  Mouth: pharynx pink and moist, no erythema, and no exudates Neck: supple, full ROM, no thyromegaly, no JVD, and no carotid bruits.  Lungs: normal respiratory effort, no accessory muscle use, normal breath sounds, no crackles, and no wheezes. Heart: normal rate, regular rhythm, no murmur, no gallop, and no rub.  Abdomen: soft, non-tender, normal bowel sounds, no distention, no guarding Msk: no joint swelling, no  joint warmth, and no redness over joints.  Pulses: 2+ DP/PT pulses bilaterally Extremities: No cyanosis, clubbing, edema Neurologic: alert & oriented X3, no focal defecits Skin: turgor normal and no rashes.  Psych: normal mood and affect  Lab results: Basic Metabolic Panel:  Recent Labs  07/08/15 1055  NA 129*  K 3.6  CL 93*  CO2 23  GLUCOSE 434*  BUN 11  CREATININE 0.90  CALCIUM 8.5*   Liver Function Tests:  Recent Labs  07/08/15 1055  AST 24  ALT 18  ALKPHOS 85  BILITOT 0.8  PROT 7.7  ALBUMIN 3.7   CBC:  Recent Labs  07/08/15 1055  WBC 7.4  NEUTROABS 5.5  HGB 14.0  HCT 40.6  MCV 81.4  PLT 269   CBG:  Recent Labs  07/08/15 1514 07/09/15 0731  GLUCAP 241* 252*   Urine Drug Screen: Drugs of Abuse     Component Value Date/Time   LABOPIA NONE DETECTED 06/15/2008 2332   COCAINSCRNUR NONE DETECTED 06/15/2008 2332   LABBENZ NONE DETECTED 06/15/2008 2332   AMPHETMU NONE DETECTED 06/15/2008 2332   THCU NONE DETECTED 06/15/2008 2332   LABBARB  06/15/2008 2332    NONE DETECTED        DRUG SCREEN FOR MEDICAL PURPOSES ONLY.  IF CONFIRMATION IS NEEDED FOR ANY PURPOSE, NOTIFY LAB WITHIN 5 DAYS.        LOWEST DETECTABLE LIMITS FOR URINE DRUG SCREEN Drug Class       Cutoff (ng/mL) Amphetamine      1000 Barbiturate      200 Benzodiazepine   [id number] Tricyclics       [id number] Opiates          300 Cocaine          300 THC              50    Alcohol Level: No results for input(s): ETH in the last 72 hours. Urinalysis:  Recent Labs  07/08/15 1230  COLORURINE YELLOW  LABSPEC 1.042*  PHURINE 5.5  GLUCOSEU >1000*  HGBUR SMALL*  BILIRUBINUR NEGATIVE  KETONESUR NEGATIVE  PROTEINUR NEGATIVE  NITRITE NEGATIVE  LEUKOCYTESUR NEGATIVE   Imaging results:  Dg Chest 2 View  07/08/2015  CLINICAL DATA:  Cough and high fever, diabetes EXAM: CHEST  2 VIEW COMPARISON:  09/11/2012 FINDINGS: The heart size and mediastinal contours are within normal limits. Both lungs  are clear. The visualized skeletal structures are unremarkable. IMPRESSION: No active cardiopulmonary disease. Electronically Signed   By: Jerilynn Mages.  Shick M.D.   On: 07/08/2015 11:12  Assessment & Plan by Problem: Principal Problem:   Influenza A  Influenza A: Patient with 2-3 day history of fevers with nausea and vomiting. Febrile in the ED to 102.9 with tachycardia to 104. CXR was negative for any acute abnormality. UA negative for infection but elevated specific gravity suggestive of dehydration. Lactate was initially elevated but improved with IVF. Initially treated as sepsis with Vanc/Zosysn started and patient receiving 2 doses at [facility]. Influenza panel positive for Influenza A. Oseltamivir initiated. Patient denies any further N/V, last fever was at 0028 this morning at 100.6. Patient with good appetite and PO intake.  -D/C Vanc/Zosyn -Continue Oseltamivir for 5 day course -Tylenol prn fever -Repeat BMET  Insulin Dependent DM II: Patient with hx of DM on novolog 70/30 30 units bid and metformin 1000 mg bid. Patient reports being out of medications for the past 2 months. A1c on 08/2014 was >14. 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 Does report some episodes of hypoglycemia (CBG 60s) overnight when he did take his insulin. Will need good follow up in clinic after discharge.  -Check A1c -SSI-moderate -Novolog 70/30 15 units bid  HTN: BP stable in the ED. Not on any antihypertensives at home. Urine microalbumin wnl at office visit 08/2014. Will continue to monitor.  HLD: Patient not on any medications. Would benefit from moderate intensity statin based on his previous labs. Will discuss with patient prior to discharge.   DVT PPx: Lovenox  Dispo: Disposition is deferred at this time, awaiting improvement of current medical problems. Anticipated discharge in approximately 0-1 day(s).   The patient does have a current PCP (Carly Montey Hora, MD) and does need  an [facility] hospital follow-up appointment after discharge.  The patient does not have transportation limitations that hinder transportation to clinic appointments.  Signed: Maryellen Pile, MD 07/09/2015, 10:16 AM

## 2015-07-10 ENCOUNTER — Telehealth: Payer: Self-pay | Admitting: Internal Medicine

## 2015-07-10 DIAGNOSIS — Z609 Problem related to social environment, unspecified: Secondary | ICD-10-CM

## 2015-07-10 DIAGNOSIS — E1165 Type 2 diabetes mellitus with hyperglycemia: Principal | ICD-10-CM

## 2015-07-10 DIAGNOSIS — Z794 Long term (current) use of insulin: Secondary | ICD-10-CM

## 2015-07-10 DIAGNOSIS — J101 Influenza due to other identified influenza virus with other respiratory manifestations: Secondary | ICD-10-CM

## 2015-07-10 LAB — HEMOGLOBIN A1C
Hgb A1c MFr Bld: 15 % — ABNORMAL HIGH (ref 4.8–5.6)
Mean Plasma Glucose: 384 mg/dL

## 2015-07-10 LAB — GLUCOSE, CAPILLARY
Glucose-Capillary: 270 mg/dL — ABNORMAL HIGH (ref 65–99)
Glucose-Capillary: 279 mg/dL — ABNORMAL HIGH (ref 65–99)

## 2015-07-10 MED ORDER — INSULIN NPH ISOPHANE & REGULAR (70-30) 100 UNIT/ML ~~LOC~~ SUSP: 20.0000 [IU] | Freq: Two times a day (BID) | SUBCUTANEOUS | Status: DC

## 2015-07-10 MED ORDER — GLUCOSE BLOOD VI STRP: ORAL_STRIP | Status: DC

## 2015-07-10 MED ORDER — PRAVASTATIN SODIUM 40 MG PO TABS: 40.0000 mg | ORAL_TABLET | Freq: Every day | ORAL | Status: DC

## 2015-07-10 MED ORDER — INSULIN ASPART PROT & ASPART (70-30 MIX) 100 UNIT/ML ~~LOC~~ SUSP
20.0000 [IU] | Freq: Two times a day (BID) | SUBCUTANEOUS | Status: DC
  Administered 2015-07-10: 20 [IU] via SUBCUTANEOUS

## 2015-07-10 MED ORDER — INSULIN PEN NEEDLE 31G X 5 MM MISC: 1.0000 | Freq: Two times a day (BID) | Status: DC

## 2015-07-10 NOTE — Telephone Encounter (Signed)
Pt needs TOC pt scheduled for 07/17/15 at 1:45 with dr Naaman Plummer.

## 2015-07-10 NOTE — Progress Notes (Signed)
   Subjective: [name] reports feeling improved today. Still reports non-productive cough and runny nose. Denies any fevers, chills, body aches, nausea or vomiting. Reports good appetite and good PO intake. Nursing staff notes that he has been refusing vitals, cbgs and meds since last night.   Objective: Vital signs in last 24 hours: Filed Vitals:   07/09/15 0630 07/09/15 0700 07/09/15 0717 07/09/15 1015  BP: 113/68 109/67 109/67 111/64  Pulse: 84 83 90 87  Temp:   99.8 F (37.7 C) 99.1 F (37.3 C)  TempSrc:   Oral Oral  Resp:   16 18  Height:      Weight:      SpO2: 96% 97% 99% 97%   Weight change:  No intake or output data in the 24 hours ending 07/10/15 0953   GENERAL- alert, co-operative, appears as stated age, not in any distress. HEENT- Atraumatic, normocephalic, PERRL, EOMI, oral mucosa appears moist CARDIAC- RRR, no murmurs, rubs or gallops. RESP- Moving equal volumes of air, and clear to auscultation bilaterally, no wheezes or crackles. ABDOMEN- Soft, nontender, bowel sounds present. EXTREMITIES- pulse 2+, symmetric, no pedal edema. SKIN- Warm, dry, No rash or lesion.  Medications: I have reviewed the patient's current medications. Scheduled Meds: . enoxaparin (LOVENOX) injection  40 mg Subcutaneous Q24H  . insulin aspart  0-9 Units Subcutaneous TID WC  . insulin aspart protamine- aspart  15 Units Subcutaneous BID WC  . oseltamivir  75 mg Oral BID   Continuous Infusions:  PRN Meds:. Assessment/Plan:  Influenza A: Patient tolerating PO intake with good appetite this morning. No fevers >24 hours but patient has been refusing vitals since last night.  -Finish Oseltamivir today -Tylenol prn fever  Insulin Dependent DM II: CBG 260 this morning. Has been 240-260s during hospitalization. -A1c pending -SSI-moderate -Increase to Novolog 70/30 20 units bid  HTN: BP stable. Not on any antihypertensives at home. Urine microalbumin wnl at office visit 08/2014. Will  continue to monitor.  HLD: Patient not on any medications. Would benefit from moderate intensity statin based on his previous labs. ASCVD risk 5.1%. Will discuss with patient prior to discharge. Will start pravastain 40 mg daily.  Dispo: Discharge today  The patient does have a current PCP (Douglas Montey Hora, MD) and does need an [name] hospital follow-up appointment after discharge.  The patient does not have transportation limitations that hinder transportation to clinic appointments.  .Services Needed at time of discharge: Y = Yes, Blank = No PT:   OT:   RN:   Equipment:   Other:     LOS: 2 days   Maryellen Pile, MD IMTS PGY-1 [phone] 07/10/2015, 9:53 AM

## 2015-07-10 NOTE — Discharge Summary (Signed)
Name: [name] MRN: [id number] DOB: [birth year] 45 y.o. PCP: Juliet Rude, MD  Date of Admission: 07/08/2015 10:10 AM Date of Discharge: 07/10/2015 Attending Physician: Axel Filler, MD  Discharge Diagnosis: Principal Problem:   Influenza A Active Problems:   Diabetes mellitus type 2, uncontrolled ([place])   Essential hypertension  Discharge Medications:   Medication List    STOP taking these medications        acetaminophen 325 MG tablet  Commonly known as:  TYLENOL     insulin starter kit- syringes Misc     naproxen 500 MG tablet  Commonly known as:  NAPROSYN     tadalafil 5 MG tablet  Commonly known as:  CIALIS      TAKE these medications        glucose blood test strip  Use to check blood sugar 3 to 4 times daily. 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 Insulin dependent     insulin NPH-regular Human (70-30) 100 UNIT/ML injection  Commonly known as:  NOVOLIN 70/30  Inject 20 Units into the skin 2 (two) times daily with a meal.     Insulin Pen Needle 31G X 5 MM Misc  Commonly known as:  EASY COMFORT PEN NEEDLES  1 Dose by Does not apply route 2 (two) times daily before a meal. Use 1 pen needle for each injection daily.     pravastatin 40 MG tablet  Commonly known as:  PRAVACHOL  Take 1 tablet (40 mg total) by mouth daily.        Disposition and follow-up:   DouglasRamel R Anderson was discharged from [facility] in Good condition.  At the hospital follow up visit please address:  1. Influenza A: Symptomatic improvement, able to tolerate PO 2. DM II: A1c 15. Has not been taking medications at home. Please see if he has filled his Rx for Novolog 70/30 20 units bid and assess for any hypoglycemia (reported hypoglycemia when on 30 units bid in the past). Last urine microalbuminuria in 08/2014 wnl would consider rechecking. May need to get [facility] Card to help with med costs.  3. HLD: ASCVD 5.1%. Discharged with Rx for pravastatin 40 mg daily. CMP on 2/18  with normal LFTs.  2.  Labs / imaging needed at time of follow-up: None 3.  Pending labs/ test needing follow-up: None  Follow-up Appointments: Follow-up Information    Follow up with Juluis Mire, MD On 07/17/2015.   Specialty:  Internal Medicine   Why:  @ 1:45 PM   Contact information:   [address] [place] [phone]       Discharge Instructions: Discharge Instructions    Diet - low sodium heart healthy    Complete by:  As directed      Increase activity slowly    Complete by:  As directed            Consultations:    Procedures Performed:  Dg Chest 2 View  07/08/2015  CLINICAL DATA:  Cough and high fever, diabetes EXAM: CHEST  2 VIEW COMPARISON:  09/11/2012 FINDINGS: The heart size and mediastinal contours are within normal limits. Both lungs are clear. The visualized skeletal structures are unremarkable. IMPRESSION: No active cardiopulmonary disease. Electronically Signed   By: Jerilynn Mages.  Shick M.D.   On: 07/08/2015 11:12   Admission HPI: [name] is a 45 year old male with a past medical history of poorly controlled DM II, HTN, HLD presenting with fever and vomiting  for 2 days. He reports that he did not feel well for 2 days prior to coming to the ED with nausea, vomiting, fever and cough. He reports checking his temperature at home and it was 102 F. Vomited twice and reports it was red in color but no gross blood. Non-productive cough during this time. Denies any chills, diarrhea, sore throat, or body aches. Did note to ED physician he was having body aches. He does report a headache. Did not receive the flu vaccine this year. Reports sick contacts at home with similar symptoms. Has had poor PO intake for the past 2 days due to the N/V.   Hospital Course by problem list: Principal Problem:   Influenza A Active Problems:   Diabetes mellitus type 2, uncontrolled ([place])   Essential hypertension   Influenza A: [name] was initially treated as possible sepsis  in the ED as he was febrile to 102.9 and tachycardic with a lactic acidosis and was given Vanc and Zosyn. CXR, UA and CBC were unremarkable. BMET only notable for hyperglycemia and mild hyponatremia that was normal when corrected for hyperglycemia. PCR was positive for Influenza A and he was started on Oseltamivir. His symptoms improved and lactic acidosis resolved after receiving IVF. He was able to tolerate PO intake without difficulty and remained afebrile for >24 hours. Discharged home with follow up in clinic.   Insulin Dependent DM II: Patient with hx of DM prescribed novolog 70/30 30 units bid and metformin 1000 mg bid. Patient reports being out of medications for the past 2 months. A1c on 08/2014 was >14. 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 Does report some episodes of hypoglycemia (CBG 60s) overnight when he did take his insulin. A1c was 15 here. CBGs were ~250 during the hospitalization while receiving Novolog 15 units bid. Discharged with new Rx for Novolog 70/30 20 units bid with lancets and testing strips. Discussed the importance of controlling his DM and following up in clinic. He has had difficulty getting his medications in the past and was given a [place] letter. Discussed bringing paperwork to clinic visit to see about getting an [employer].   HTN: BP stable during hospitalization. Not on any antihypertensives at home. Urine microalbumin wnl at office visit 08/2014. Follow up in clinic.   HLD: Patient not on any home medications. Would benefit from moderate intensity statin based on his previous labs with ASCVD risk 5.1%. Discharged with Rx for pravastatin 40 mg daily.   Discharge Vitals:   BP 111/64 mmHg  Pulse 87  Temp(Src) 99.1 F (37.3 C) (Oral)  Resp 18  Ht 5' 6"  (1.676 m)  Wt 144 lb (65.318 kg)  BMI 23.25 kg/m2  SpO2 97%  Discharge Labs:  Results for orders placed or performed during the hospital encounter of 07/08/15 (from the  past 24 hour(s))  Glucose, capillary     Status: Abnormal   Collection Time: 07/10/15 12:14 PM  Result Value Ref Range   Glucose-Capillary 270 (H) 65 - 99 mg/dL   Comment 1 Notify RN   Glucose, capillary     Status: Abnormal   Collection Time: 07/10/15  5:15 PM  Result Value Ref Range   Glucose-Capillary 279 (H) 65 - 99 mg/dL    Signed: Maryellen Pile, MD 07/11/2015, 11:16 AM

## 2015-07-10 NOTE — Progress Notes (Signed)
Pt refused to have MRSA PCR collected at this time. 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

## 2015-07-10 NOTE — Telephone Encounter (Signed)
Still in house, will defer until tomorrow

## 2015-07-10 NOTE — Progress Notes (Signed)
Pt ready for d/c home per MD. Discharge teaching and prescriptions reviewed with pt, all questions answered. Peripheral IV removed, belongings gathered and will be sent with pt. Waiting for transportation via family member. Will continue to monitor until that time.   Raquel James  07/10/2015

## 2015-07-10 NOTE — Discharge Instructions (Signed)
Your symptoms are due to the Flu. It is important that you take in plenty of fluids over the next several days until you start feeling better. It will take several more days, up to a week, before you start to feel completely better.   It is important that we get your Diabetes under control. It is likely that the Flu hit you so hard because your diabetes is not well controlled. We will be sending in new prescriptions for your medications to your pharmacy. Please pick these up after discharge.  We have scheduled you a follow up appointment in our clinic downstairs for next Monday the 27th at 1:45 PM with Dr. Naaman Plummer. If you cannot make this appointment please call the clinic and reschedule. You can also bring your paper work to get the process started on obtaining an [employer] to help with the costs of your medications. If you are unsure what paper work to bring please call the clinic and they can give you a list of everything you need to bring.  Influenza, Adult Influenza ("the flu") is a viral infection of the respiratory tract. It occurs more often in winter months because people spend more time in close contact with one another. Influenza can make you feel very sick. Influenza easily spreads from person to person (contagious). CAUSES  Influenza is caused by a virus that infects the respiratory tract. You can catch the virus by breathing in droplets from an infected person's cough or sneeze. You can also catch the virus by touching something that was recently contaminated with the virus and then touching your mouth, nose, or eyes. RISKS AND COMPLICATIONS You may be at risk for a more severe case of influenza if you smoke cigarettes, have diabetes, have chronic heart disease (such as heart failure) or lung disease (such as asthma), or if you have a weakened immune system. Elderly people and pregnant women are also at risk for more serious infections. The most common problem of influenza is a lung  infection (pneumonia). Sometimes, this problem can require emergency medical care and may be life threatening. SIGNS AND SYMPTOMS  Symptoms typically last 4 to 10 days and may include:  Fever.  Chills.  Headache, body aches, and muscle aches.  Sore throat.  Chest discomfort and cough.  Poor appetite.  Weakness or feeling tired.  Dizziness.  Nausea or vomiting. DIAGNOSIS  Diagnosis of influenza is often made based on your history and a physical exam. A nose or throat swab test can be done to confirm the diagnosis. TREATMENT  In mild cases, influenza goes away on its own. Treatment is directed at relieving symptoms. For more severe cases, your health care provider may prescribe antiviral medicines to shorten the sickness. Antibiotic medicines are not effective because the infection is caused by a virus, not by bacteria. HOME CARE INSTRUCTIONS  Take medicines only as directed by your health care provider.  Use a cool mist humidifier to make breathing easier.  Get plenty of rest until your temperature returns to normal. This usually takes 3 to 4 days.  Drink enough fluid to keep your urine clear or pale yellow.  Cover yourmouth and nosewhen coughing or sneezing,and wash your handswellto prevent thevirusfrom spreading.  Stay homefromwork orschool untilthe fever is gonefor at least 49full day. PREVENTION  An annual influenza vaccination (flu shot) is the best way to avoid getting influenza. An annual flu shot is now routinely recommended for all adults in the [place] IF:  You experiencechest pain, yourcough worsens,or you producemore mucus.  Youhave nausea,vomiting, ordiarrhea.  Your fever returns or gets worse. SEEK IMMEDIATE MEDICAL CARE IF:  You havetrouble breathing, you become short of breath,or your skin ornails becomebluish.  You have severe painor stiffnessin the neck.  You develop a sudden headache, or pain in the face or  ear.  You have nausea or vomiting that you cannot control. MAKE SURE YOU:   Understand these instructions.  Will watch your condition.  Will get help right away if you are not doing well or get worse.   This information is not intended to replace advice given to you by your health care provider. Make sure you discuss any questions you have with your health care provider.   Document Released: 05/03/2000 Document Revised: 05/27/2014 Document Reviewed: 08/05/2011 Elsevier Interactive Patient Education [employer].

## 2015-07-10 NOTE — Care Management Note (Signed)
Case Management Note  Patient Details  Name: [name] MRN: [id number] Date of Birth: Oct 02, 1970  Subjective/Objective:           Admitted with influenza         Action/Plan: Spoke with patient about [place] program. Gave patient [place] letter and pharmacy list. Patient stated that he will have assistance at home after discharge.    Expected Discharge Date:                  Expected Discharge Plan:  Home/Self Care  In-House Referral:  NA  Discharge planning Services  [place] Program, CM Consult  Post Acute Care Choice:  NA Choice offered to:     DME Arranged:    DME Agency:     HH Arranged:    HH Agency:     Status of Service:  Completed, signed off  Medicare Important Message Given:    Date Medicare IM Given:    Medicare IM give by:    Date Additional Medicare IM Given:    Additional Medicare Important Message give by:     If discussed at [place] of Stay Meetings, dates discussed:    Additional Comments:  Nila Nephew, RN 07/10/2015, 2:51 PM

## 2015-07-11 NOTE — Telephone Encounter (Signed)
Lm for rtc 

## 2015-07-12 NOTE — Telephone Encounter (Signed)
LVM for return call.  Reminded of appointment 2/27 1:45

## 2015-07-13 LAB — CULTURE, BLOOD (ROUTINE X 2)
Culture: NO GROWTH
Culture: NO GROWTH

## 2015-07-13 NOTE — Telephone Encounter (Signed)
Lm for rtc 

## 2015-07-14 ENCOUNTER — Telehealth: Payer: Self-pay | Admitting: Internal Medicine

## 2015-07-14 NOTE — Telephone Encounter (Signed)
APPT. REMINDER CALL, LMTCB IF HE NEEDS TO CANCEL °

## 2015-07-17 ENCOUNTER — Encounter: Payer: Self-pay | Admitting: Internal Medicine

## 2015-07-17 ENCOUNTER — Ambulatory Visit: Payer: Self-pay | Admitting: Internal Medicine

## 2015-08-23 ENCOUNTER — Encounter: Payer: Self-pay | Admitting: Pharmacist

## 2015-08-23 NOTE — Progress Notes (Signed)
Patient ID: [name], male   DOB: Oct 20, 1970, 45 y.o.   MRN: [id number]  Medication Samples have been provided to the patient.  Drug: Novolog 70/30 Strength: 100 units/mL Qty: 1 LOT: [id number] Exp.Date: 9/17  The patient has been instructed regarding the correct time, dose, and frequency of taking this medication, including desired effects and most common side effects.   Nili Honda J 3:04 PM 08/23/2015

## 2015-08-28 ENCOUNTER — Telehealth: Payer: Self-pay | Admitting: Internal Medicine

## 2015-08-28 NOTE — Telephone Encounter (Signed)
APPT. REMINDER CALL, LMTCB °

## 2015-08-29 ENCOUNTER — Encounter: Payer: Self-pay | Admitting: Internal Medicine

## 2015-08-29 ENCOUNTER — Ambulatory Visit: Payer: Self-pay | Admitting: Internal Medicine

## 2015-10-24 ENCOUNTER — Encounter (HOSPITAL_COMMUNITY): Payer: Self-pay

## 2015-10-24 ENCOUNTER — Emergency Department (HOSPITAL_COMMUNITY): Payer: No Typology Code available for payment source

## 2015-10-24 ENCOUNTER — Emergency Department (HOSPITAL_COMMUNITY)
Admission: EM | Admit: 2015-10-24 | Discharge: 2015-10-24 | Disposition: A | Discharge status: Home / Self Care | Payer: No Typology Code available for payment source | Attending: Emergency Medicine | Admitting: Emergency Medicine

## 2015-10-24 DIAGNOSIS — Z9889 Other specified postprocedural states: Secondary | ICD-10-CM | POA: Insufficient documentation

## 2015-10-24 DIAGNOSIS — Z794 Long term (current) use of insulin: Secondary | ICD-10-CM | POA: Insufficient documentation

## 2015-10-24 DIAGNOSIS — E119 Type 2 diabetes mellitus without complications: Secondary | ICD-10-CM | POA: Insufficient documentation

## 2015-10-24 DIAGNOSIS — I10 Essential (primary) hypertension: Secondary | ICD-10-CM | POA: Insufficient documentation

## 2015-10-24 DIAGNOSIS — E785 Hyperlipidemia, unspecified: Secondary | ICD-10-CM | POA: Insufficient documentation

## 2015-10-24 DIAGNOSIS — Z8701 Personal history of pneumonia (recurrent): Secondary | ICD-10-CM | POA: Insufficient documentation

## 2015-10-24 DIAGNOSIS — Z79899 Other long term (current) drug therapy: Secondary | ICD-10-CM | POA: Insufficient documentation

## 2015-10-24 DIAGNOSIS — M778 Other enthesopathies, not elsewhere classified: Secondary | ICD-10-CM | POA: Insufficient documentation

## 2015-10-24 MED ORDER — NAPROXEN 500 MG PO TABS: 500.0000 mg | ORAL_TABLET | Freq: Two times a day (BID) | ORAL | Status: DC

## 2015-10-24 NOTE — ED Notes (Signed)
Declined W/C at D/C and was escorted to lobby by RN. 

## 2015-10-24 NOTE — ED Provider Notes (Signed)
CSN: [id number]     Arrival date & time 10/24/15  1419 History   By signing my name below, I, Evelene Croon, attest that this documentation has been prepared under the direction and in the presence of non-physician practitioner, Jeannett Senior, PA-C. Electronically Signed: Evelene Croon, Scribe. 10/24/2015. 4:00 PM.  Chief Complaint  Patient presents with  . Wrist Pain   The history is provided by the patient. No language interpreter was used.     HPI Comments:  [name] is a 45 y.o. male who presents to the Emergency Department complaining of intermittent, moderate, pain to the right wrist region x a few weeks. He denies recent injury but notes since returning to work where he uses his hands in a repetitive motion. Pt reports a h/o  surgery to the right elbow requiring hardware 6 years ago, no pain to the elbow. Pt has no other complaints or symptoms at this time. No alleviating factors noted. No medications taken prior to coming in.    Past Medical History  Diagnosis Date  . Diabetes mellitus type II, uncontrolled ([place]) 07/17/2006  . ESSENTIAL HYPERTENSION 07/17/2006  . HYPERLIPIDEMIA 11/24/2008  . Pneumonia 10/22/2011   Past Surgical History  Procedure Laterality Date  . Arm surgery     Family History  Problem Relation Age of Onset  . Hypertension Mother 32  . Cancer Mother     bladder cancer  . Alcohol abuse Father   . Hypertension Brother    Social History  Substance Use Topics  . Smoking status: Never Smoker   . Smokeless tobacco: Never Used  . Alcohol Use: No    Review of Systems  Constitutional: Negative for fever and chills.  Musculoskeletal: Positive for myalgias and arthralgias.  Neurological: Negative for weakness and numbness.   Allergies  Sulfa antibiotics; Bactrim; and Cephalexin  Home Medications   Prior to Admission medications   Medication Sig Start Date End Date Taking? Authorizing Provider  glucose blood test strip Use to check blood sugar 3  to 4 times daily. 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 Insulin dependent 07/10/15   Maryellen Pile, MD  insulin NPH-regular Human (NOVOLIN 70/30) (70-30) 100 UNIT/ML injection Inject 20 Units into the skin 2 (two) times daily with a meal. 07/10/15   Maryellen Pile, MD  Insulin Pen Needle (EASY COMFORT PEN NEEDLES) 31G X 5 MM MISC 1 Dose by Does not apply route 2 (two) times daily before a meal. Use 1 pen needle for each injection daily. 07/10/15   Maryellen Pile, MD  pravastatin (PRAVACHOL) 40 MG tablet Take 1 tablet (40 mg total) by mouth daily. 07/10/15   Maryellen Pile, MD   BP 107/84 mmHg  Pulse 100  Temp(Src) 98.5 F (36.9 C) (Oral)  Resp 16  Ht 5\' 7"  (1.702 m)  Wt 143 lb (64.864 kg)  BMI 22.39 kg/m2  SpO2 99% Physical Exam  Constitutional: He is oriented to person, place, and time. He appears well-developed and well-nourished. No distress.  HENT:  Head: Normocephalic and atraumatic.  Eyes: Conjunctivae are normal.  Cardiovascular: Normal rate.   Pulmonary/Chest: Effort normal.  Abdominal: He exhibits no distension.  Musculoskeletal:  Mild swelling noted to the radial aspect of the right wrist. Tender to palpation over medial wrist. Pain with range of motion of the wrist in both directions and with range of motion of the thumb. Positive Finkelstein's test. Distal radial pulse intact. Grip strength is 5 out of 5. Normal right elbow, unable to extend completely, from prior surgery.  Neurological: He is alert and oriented to person, place, and time.  Skin: Skin is warm and dry.  Psychiatric: He has a normal mood and affect.  Nursing note and vitals reviewed.   ED Course  Procedures  DIAGNOSTIC STUDIES:  Oxygen Saturation is 99% on RA, normal by my interpretation.    COORDINATION OF CARE:  3:50 PM Will order XR of the right wrist.  Discussed treatment plan with pt at bedside and pt agreed to plan.  Labs Review Labs Reviewed - No data to display  Imaging Review Dg Wrist Complete  Right  10/24/2015  CLINICAL DATA:  Right lateral wrist pain. EXAM: RIGHT WRIST - COMPLETE 3+ VIEW COMPARISON:  None. FINDINGS: There is no evidence of fracture or dislocation. There is no evidence of arthropathy or other focal bone abnormality. Soft tissues are unremarkable. IMPRESSION: Negative. Electronically Signed   By: Aletta Edouard M.D.   On: 10/24/2015 16:42   I have personally reviewed and evaluated these images and lab results as part of my medical decision-making.   EKG Interpretation None      MDM   Final diagnoses:  Wrist tendonitis   Patient emergency department with pain to the right wrist after starting a new job and doing repetitive motion. Pain is mainly over radial aspect of the wrist. X-rays negative. Exam was consistent with tendinitis. We'll placed in a Velcro splint. Ice and elevation at home. NSAIDs. Follow-up with hand specialist if not improving. Also instructed to not lift or do anything strenuous with the right hand until improved.  Filed Vitals:   10/24/15 1443 10/24/15 1707  BP: 107/84 110/80  Pulse: 100 68  Temp: 98.5 F (36.9 C)   TempSrc: Oral   Resp: 16 18  Height: 5\' 7"  (1.702 m)   Weight: 64.864 kg   SpO2: 99% 100%    I personally performed the services described in this documentation, which was scribed in my presence. The recorded information has been reviewed and is accurate.   Jeannett Senior, PA-C 10/24/15 [place], MD 10/25/15 [phone]

## 2015-10-24 NOTE — ED Notes (Signed)
PT. HAD SURGERY ON HIS RT. FOREARM AND SINCE HE STARTED WORKING HE HAS BEEN HAVING PAIN TO HIS RT. WRIST.  +ROM AND +SENSATION, +RADIAL PULSE, NO SWELLING NOTED

## 2015-10-24 NOTE — Discharge Instructions (Signed)
Naprosyn for pain and inflammation. Keep splint on at all times. Follow up with orthopedics specialist as referred. Ice and elevate at home.   [name] Disease [name] disease is inflammation of the tendon on the thumb side of the wrist. Tendons are cords of tissue that connect bones to muscles. The tendons in your hand pass through a tunnel, or sheath. A slippery layer of tissue (synovium) lets the tendons move smoothly in the sheath. With de Quervain disease, the sheath swells or thickens, causing friction and pain. The condition is also called de Quervain tendinosis and de Quervain syndrome. It occurs most often in women who are 43 years old. CAUSES  The exact cause of de Quervain disease is not known. It may result from:   Overusing your hands, especially with repetitive motions that involve twisting your hand or using a forceful grip.  Pregnancy.  Rheumatoid disease. RISK FACTORS You may have a greater risk for de Quervain disease if you:  Are a middle-aged woman.  Are pregnant.  Have rheumatoid arthritis.  Have diabetes.  Use your hands far more than normal, especially with a tight grip or excessive twisting. SIGNS AND SYMPTOMS Pain on the thumb side of your wrist is the main symptom of de Quervain disease. Other signs and symptoms include:  Pain that gets worse when you grasp something or turn your wrist.  Pain that extends up the forearm.  Cysts in the area of the pain.  Swelling of your wrist and hand.  A sensation of snapping in the wrist.  Trouble moving the thumb and wrist. DIAGNOSIS  Your health care provider may diagnose de Quervain disease based on your signs and symptoms. A physical exam will also be done. A simple test Wynn Maudlin test) that involves pulling your thumb and wrist to see if this causes pain can help determine whether you have the condition. Sometimes you may need to have an X-ray.  TREATMENT  Avoiding any activity that causes pain  and swelling is the best treatment. Other options include:  Wearing a splint.  Taking medicine. Anti-inflammatory medicines and corticosteroid injections may reduce inflammation and relieve pain.  Having surgery if other treatments do not work. HOME CARE INSTRUCTIONS   Using ice can be helpful after doing activities that involve the sore wrist. To apply ice to the injured area:  Put ice in a plastic bag.  Place a towel between your skin and the bag.  Leave the ice on for 20 minutes, 2-3 times a day.  Take medicines only as directed by your health care provider.  Wear your splint as directed. This will allow your hand to rest and heal. SEEK MEDICAL CARE IF:   Your pain medicine does not help.   Your pain gets worse.  You develop new symptoms. MAKE SURE YOU:   Understand these instructions.  Will watch your condition.  Will get help right away if you are not doing well or get worse.   This information is not intended to replace advice given to you by your health care provider. Make sure you discuss any questions you have with your health care provider.   Document Released: 01/29/2001 Document Revised: 05/27/2014 Document Reviewed: 09/08/2013 Elsevier Interactive Patient Education [employer].

## 2016-01-02 ENCOUNTER — Encounter (HOSPITAL_COMMUNITY): Payer: Self-pay | Admitting: Emergency Medicine

## 2016-01-02 ENCOUNTER — Emergency Department (HOSPITAL_COMMUNITY): Payer: Self-pay

## 2016-01-02 ENCOUNTER — Emergency Department (HOSPITAL_BASED_OUTPATIENT_CLINIC_OR_DEPARTMENT_OTHER): Payer: Self-pay

## 2016-01-02 ENCOUNTER — Emergency Department (HOSPITAL_COMMUNITY)
Admission: EM | Admit: 2016-01-02 | Discharge: 2016-01-02 | Disposition: A | Discharge status: Home / Self Care | Payer: Self-pay | Attending: Emergency Medicine | Admitting: Emergency Medicine

## 2016-01-02 DIAGNOSIS — I447 Left bundle-branch block, unspecified: Secondary | ICD-10-CM | POA: Insufficient documentation

## 2016-01-02 DIAGNOSIS — I10 Essential (primary) hypertension: Secondary | ICD-10-CM | POA: Insufficient documentation

## 2016-01-02 DIAGNOSIS — M25512 Pain in left shoulder: Secondary | ICD-10-CM

## 2016-01-02 DIAGNOSIS — Z794 Long term (current) use of insulin: Secondary | ICD-10-CM | POA: Insufficient documentation

## 2016-01-02 DIAGNOSIS — E119 Type 2 diabetes mellitus without complications: Secondary | ICD-10-CM | POA: Insufficient documentation

## 2016-01-02 DIAGNOSIS — R079 Chest pain, unspecified: Secondary | ICD-10-CM

## 2016-01-02 LAB — I-STAT TROPONIN, ED
Troponin i, poc: 0.01 ng/mL (ref 0.00–0.08)
Troponin i, poc: 0 ng/mL (ref 0.00–0.08)

## 2016-01-02 LAB — ECHOCARDIOGRAM COMPLETE
Height: 67 in
Weight: 2304 oz

## 2016-01-02 LAB — CBC
HCT: 38.7 % — ABNORMAL LOW (ref 39.0–52.0)
Hemoglobin: 12.9 g/dL — ABNORMAL LOW (ref 13.0–17.0)
MCH: 27.7 pg (ref 26.0–34.0)
MCHC: 33.3 g/dL (ref 30.0–36.0)
MCV: 83.2 fL (ref 78.0–100.0)
Platelets: 278 10*3/uL (ref 150–400)
RBC: 4.65 MIL/uL (ref 4.22–5.81)
RDW: 12.6 % (ref 11.5–15.5)
WBC: 5.3 10*3/uL (ref 4.0–10.5)

## 2016-01-02 LAB — BASIC METABOLIC PANEL
Anion gap: 6 (ref 5–15)
BUN: 15 mg/dL (ref 6–20)
CO2: 26 mmol/L (ref 22–32)
Calcium: 9.1 mg/dL (ref 8.9–10.3)
Chloride: 106 mmol/L (ref 101–111)
Creatinine, Ser: 0.77 mg/dL (ref 0.61–1.24)
GFR calc Af Amer: >60 mL/min (ref >60)
GFR calc non Af Amer: >60 mL/min (ref >60)
Glucose, Bld: 267 mg/dL — ABNORMAL HIGH (ref 65–99)
Potassium: 4 mmol/L (ref 3.5–5.1)
Sodium: 138 mmol/L (ref 135–145)

## 2016-01-02 MED ORDER — ACETAMINOPHEN 500 MG PO TABS: 500.0000 mg | ORAL_TABLET | Freq: Four times a day (QID) | ORAL | 0 refills | Status: DC | PRN

## 2016-01-02 MED ORDER — ASPIRIN 81 MG PO CHEW
324.0000 mg | CHEWABLE_TABLET | Freq: Once | ORAL | Status: AC
  Administered 2016-01-02: 324 mg via ORAL
  Filled 2016-01-02: qty 4

## 2016-01-02 MED ORDER — LISINOPRIL 10 MG PO TABS: 10.0000 mg | ORAL_TABLET | Freq: Every day | ORAL | 0 refills | Status: DC

## 2016-01-02 NOTE — Consult Note (Signed)
Date: 01/02/2016               Patient Name:  [name] MRN: [id number]  DOB: 1970-08-02 Age / Sex: 45 y.o., male   PCP: Juliet Rude, MD         Requesting Physician: Dr. Elnora Morrison, MD    Consulting Reason:  Abnormal ECG     Chief Complaint: Left shoulder pain  History of Present Illness: [name] is a 45 y.o man with PMH of DM, HTN and HLP presented to ED with c/o worsening left shoulder pain, started on Thursday while watching tv, constant with variable intensities, never completely resolved, radiating to left neck, 9/10 in intensity, aggravated with shoulder movements and sleeping on left side.It became worse today and he came to ED for evaluation. He denies any chest pain,palpitation, diaphoresis, SOB , exertional dyspnea, orthopnea or PND.No h/o N/V/D. No recent change in his bowel or urinary habits. He works in a [employer] but denies any  activity causing his current symptoms. He was found to have PMH of HTN, and HPL but not taking any meds. For that. He states taking some BP pills long time ago, don't remember the name and then taken off by his PCP.Records indicate on Pravastatin but he is not aware of that. He was admitted in IMTS in 02/17. Due to H Influenza  Infection and was told about his uncontrolled DM and told to [facility] at [facility], but no FU.  Meds: No current facility-administered medications for this encounter.    Current Outpatient Prescriptions  Medication Sig Dispense Refill  . glucose blood test strip Use to check blood sugar 3 to 4 times daily. 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 Insulin dependent 100 each 12  . insulin NPH-regular Human (NOVOLIN 70/30) (70-30) 100 UNIT/ML injection Inject 20 Units into the skin 2 (two) times daily with a meal. 30 mL 0  . Insulin Pen Needle (EASY COMFORT PEN NEEDLES) 31G X 5 MM MISC 1 Dose by Does not apply route 2 (two) times daily before a meal. Use 1 pen needle for each injection daily. 100 each 3  . naproxen (NAPROSYN) 500 MG tablet Take  1 tablet (500 mg total) by mouth 2 (two) times daily. 30 tablet 0  . pravastatin (PRAVACHOL) 40 MG tablet Take 1 tablet (40 mg total) by mouth daily. 30 tablet 2    Allergies: Allergies as of 01/02/2016 - Review Complete 01/02/2016  Allergen Reaction Noted  . Sulfa antibiotics Rash 09/10/2012  . Bactrim [sulfamethoxazole-trimethoprim]  09/08/2012  . Cephalexin  09/11/2012   Past Medical History:  Diagnosis Date  . Diabetes mellitus type II, uncontrolled ([place]) 07/17/2006  . ESSENTIAL HYPERTENSION 07/17/2006  . HYPERLIPIDEMIA 11/24/2008  . Pneumonia 10/22/2011   Past Surgical History:  Procedure Laterality Date  . ARM SURGERY     Family History  Problem Relation Age of Onset  . Hypertension Mother 60  . Cancer Mother     bladder cancer  . Alcohol abuse Father   . Hypertension Brother    Social History   Social History  . Marital status: Married    Spouse name: N/A  . Number of children: N/A  . Years of education: N/A   Occupational History  . 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 [employer]     Began Jan 2014   Social History Main Topics  . Smoking status: Never Smoker  .  Smokeless tobacco: Never Used  . Alcohol use No  . Drug use: No  . Sexual activity: Yes    Partners: Female     Comment: Not sexually active since April 2013   Other Topics Concern  . Not on file   Social History Narrative   Lives with his mother and brother.   Wife has had a foot amputation; is a type 1 DM.    Review of Systems: Pertinent items are noted in HPI.  Physical Exam: Blood pressure 135/92, pulse 79, temperature 98.1 F (36.7 C), temperature source Oral, resp. rate 16, height 5\' 7"  (1.702 m), weight 144 lb (65.3 kg), SpO2 99 %. BP 135/92 (BP Location: Right Arm)   Pulse 79   Temp 98.1 F (36.7 C) (Oral)   Resp 16   Ht 5\' 7"  (1.702 m)   Wt 144 lb (65.3 kg)   SpO2 99%   BMI 22.55 kg/m   General Appearance:    Alert, cooperative, no  distress, appears stated age  Head:    Normocephalic, without obvious abnormality, atraumatic  Eyes:    PERRL, conjunctiva/corneas clear, EOM's intact, fundi    benign, both eyes          Nose:   Nares normal, septum midline, mucosa normal, no drainage    or sinus tenderness  Throat:   Lips, mucosa, and tongue normal; teeth and gums normal  Neck:   Supple, symmetrical, trachea midline, no adenopathy;       thyroid:  No enlargement/tenderness/nodules; no carotid   bruit or JVD  Musculoskeletal::    ROM normal at left shoulder, no  Tenderness, erythema or edema.  Lungs:     Clear to auscultation bilaterally, respirations unlabored  Chest wall:    No tenderness or deformity  Heart:    Regular rate and rhythm, S1 and S2 normal, no murmur, rub   or gallop  Abdomen:     Soft, non-tender, bowel sounds active all four quadrants,    no masses, no organomegaly        Extremities:   Extremities normal, atraumatic, no cyanosis or edema  Pulses:   2+ and symmetric all extremities  Skin:   Skin color, texture, turgor normal, no rashes or lesions     Neurologic:   CNII-XII intact. Normal strength, sensation and reflexes      throughout     Lab results: BMP Latest Ref Rng & Units 01/02/2016 07/09/2015 07/08/2015  Glucose 65 - 99 mg/dL 267(H) 315(H) 434(H)  BUN 6 - 20 mg/dL 15 10 11   Creatinine 0.61 - 1.24 mg/dL 0.77 0.84 0.90  Sodium 135 - 145 mmol/L 138 132(L) 129(L)  Potassium 3.5 - 5.1 mmol/L 4.0 3.7 3.6  Chloride 101 - 111 mmol/L 106 96(L) 93(L)  CO2 22 - 32 mmol/L 26 24 23   Calcium 8.9 - 10.3 mg/dL 9.1 8.2(L) 8.5(L)   CBC Latest Ref Rng & Units 01/02/2016 07/09/2015 07/08/2015  WBC 4.0 - 10.5 K/uL 5.3 4.9 7.4  Hemoglobin 13.0 - 17.0 g/dL 12.9(L) 12.4(L) 14.0  Hematocrit 39.0 - 52.0 % 38.7(L) 37.2(L) 40.6  Platelets 150 - 400 K/uL 278 222 269   Trop. 0.01  Imaging results: Dg Chest 2 View  Result Date: 01/02/2016 CLINICAL DATA:  Left shoulder pain EXAM: CHEST  2 VIEW COMPARISON:   07/08/2015 FINDINGS: The heart size and mediastinal contours are within normal limits. Both lungs are clear. The visualized skeletal structures are unremarkable. IMPRESSION: No active cardiopulmonary disease. Electronically Signed  By: Franchot Gallo M.D.   On: 01/02/2016 13:40    Other results: EKG: Shows left bundle branch block with S T abnormalities.  Assessment, Plan, & Recommendations by Problem:  [name] is a 45 y.o man with PMH of DM, HTN and HLP presented to ED with c/o worsening left shoulder pain, started on Thursday while watching tv, constant with variable intensities, never completely resolved, radiating to left neck with out any associated symptoms.Cardiology was consulted for abnormal ECG.  Abnormal ECG:He does not have any symptoms concerning for ACS. ECG shows left bundle  branch block with S T abnormalities. Different from his previous ECG done in 2013.Look like he is having some cardiomyopathy ?He has cardiac risk factors of DM, HTN and HLD. -Repeat Trop. In 3 hrs. -ECHO  If negative can be discharged.  HTN: Mildly elevated BP during his current visit to ED.  He should be on lisinopril 10 mg daily considering him being diabetic.  DM: Seems uncontrolled. His last HgbA1c done in 02/17 was 15. Current blood glucose is 267. He is currently on Novolog 70-30, 20 units BID. Looks like he does not have in site of his medical problems., he need education and close follow up with his PCP.  HPL: His lipid panel done in 04/16. Shows  Lipid Panel     Component Value Date/Time   CHOL 159 09/06/2014 1657   TRIG 191 (H) 09/06/2014 1657   HDL 37 (L) 09/06/2014 1657   CHOLHDL 4.3 09/06/2014 1657   VLDL 38 09/06/2014 1657   LDLCALC 84 09/06/2014 1657  He was put on Pravastatin but never took it. -Should repeat lipid panel as out patient and treat accordingly.  Dispo: Disposition is deferred at this time, awaiting improvement of current medical problems.  The patient does have a  current PCP (Carly Montey Hora, MD) and does need an [facility] hospital follow-up appointment after discharge.  The patient does not have transportation limitations that hinder transportation to clinic appointments.  Signed: Lorella Nimrod, MD 01/02/2016, 2:57 PM   Patient seen, examined. Available data reviewed. Agree with findings, assessment, and plan as outlined by Dr Reesa Chew. Exam reveals an alert, oriented male in no distress. Lungs are clear to auscultation bilaterally. JVP is normal. Carotid upstrokes are normal with no bruits. Heart is regular rate and rhythm with no murmurs or gallops. Abdomen is soft and nontender. Extremities show no edema. Peripheral pulses are 2+ and equal bilaterally. There is point tenderness over the left posterior shoulder.  The patient presents with shoulder pain. He is found to have a new left bundle branch block on EKG with evidence of ventricular hypertrophy. An echocardiogram is performed to evaluate for cardiomyopathy. The patient's LV function is normal. There is no significant valvular disease. Diastolic parameters are normal. Serial troponins are normal. His symptoms are highly atypical for acute coronary syndrome. No further cardiac evaluation is indicated at this time.  Sherren Mocha, M.D. 01/02/2016 4:29 PM

## 2016-01-02 NOTE — Progress Notes (Signed)
  Echocardiogram 2D Echocardiogram has been performed.  Donata Clay 01/02/2016, 3:57 PM

## 2016-01-02 NOTE — ED Notes (Signed)
Xray notified to take patient to B15 upon return to the department.

## 2016-01-02 NOTE — ED Triage Notes (Signed)
Left shoulder pain, no n/v/sob deneies cp or injury

## 2016-01-02 NOTE — ED Provider Notes (Signed)
[facility] DEPT Provider Note   CSN: [id number] Arrival date & time: 01/02/16  1247  History   Chief Complaint Chief Complaint  Patient presents with  . Shoulder Pain  . Chest Pain    HPI [name] is a 45 y.o. male with history of diabetes, hypertension who presents via private vehicle for evaluation treatment of left shoulder pain. Spinal for roughly 1 week, described as achy in nature. No nausea, vomiting diaphoresis. No chest pain or shortness of breath. No aggravating or alleviating factors. Patient took no medication at home for the pain.  The history is provided by the patient. No language interpreter was used.    Past Medical History:  Diagnosis Date  . Diabetes mellitus type II, uncontrolled ([place]) 07/17/2006  . ESSENTIAL HYPERTENSION 07/17/2006  . HYPERLIPIDEMIA 11/24/2008  . Pneumonia 10/22/2011    Patient Active Problem List   Diagnosis Date Noted  . New left bundle branch block (LBBB)   . Influenza A 07/08/2015  . 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 Hyperlipidemia LDL goal <70 11/24/2008  . Diabetes mellitus type 2, uncontrolled ([place]) 07/17/2006  . Essential hypertension 07/17/2006    Past Surgical History:  Procedure Laterality Date  . ARM SURGERY         Home Medications    Prior to Admission medications   Medication Sig Start Date End Date Taking? Authorizing Provider  glucose blood test strip Use to check blood sugar 3 to 4 times daily. 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 Insulin dependent 07/10/15  Yes Maryellen Pile, MD  insulin NPH-regular Human (NOVOLIN 70/30) (70-30) 100 UNIT/ML injection Inject 20 Units into the skin 2 (two) times daily with a meal. 07/10/15  Yes Maryellen Pile, MD  Insulin Pen Needle (EASY COMFORT PEN NEEDLES) 31G X 5 MM MISC 1 Dose by Does not apply route 2 (two) times daily before a meal. Use 1 pen needle for each injection daily. 07/10/15  Yes Maryellen Pile, MD  naproxen (NAPROSYN) 500 MG tablet Take 1 tablet (500 mg  total) by mouth 2 (two) times daily. 10/24/15  Yes Tatyana Kirichenko, PA-C  pravastatin (PRAVACHOL) 40 MG tablet Take 1 tablet (40 mg total) by mouth daily. 07/10/15  Yes Maryellen Pile, MD  acetaminophen (TYLENOL) 500 MG tablet Take 1 tablet (500 mg total) by mouth every 6 (six) hours as needed. 01/02/16   Vira Blanco, MD  lisinopril (PRINIVIL,ZESTRIL) 10 MG tablet Take 1 tablet (10 mg total) by mouth daily. 01/02/16 01/16/16  Vira Blanco, MD    Family History Family History  Problem Relation Age of Onset  . Hypertension Mother 38  . Cancer Mother     bladder cancer  . Alcohol abuse Father   . Hypertension Brother     Social History Social History  Substance Use Topics  . Smoking status: Never Smoker  . Smokeless tobacco: Never Used  . Alcohol use No     Allergies   Sulfa antibiotics; Bactrim [sulfamethoxazole-trimethoprim]; and Cephalexin   Review of Systems Review of Systems  Constitutional: Negative for chills and fever.  HENT: Negative for ear pain and sore throat.   Eyes: Negative for pain and visual disturbance.  Respiratory: Negative for cough and shortness of breath.   Cardiovascular: Negative for chest pain and palpitations.  Gastrointestinal: Negative for abdominal pain and vomiting.  Genitourinary: Negative for dysuria and hematuria.  Musculoskeletal: Negative for arthralgias and back pain.  Skin: Negative for color change and rash.  Neurological: Negative for seizures and syncope.  All  other systems reviewed and are negative.    Physical Exam Updated Vital Signs BP 124/81   Pulse 72   Temp 98.1 F (36.7 C) (Oral)   Resp 11   Ht 5\' 7"  (1.702 m)   Wt 65.3 kg   SpO2 98%   BMI 22.55 kg/m   Physical Exam  Constitutional: He appears well-developed and well-nourished.  HENT:  Head: Normocephalic and atraumatic.  Eyes: Conjunctivae are normal.  Neck: Neck supple.  Cardiovascular: Normal rate and regular rhythm.   No murmur heard. Pulmonary/Chest:  Effort normal and breath sounds normal. No respiratory distress.  Abdominal: Soft. There is no tenderness.  Musculoskeletal: He exhibits no edema.  Neurological: He is alert.  Skin: Skin is warm and dry.  Psychiatric: He has a normal mood and affect.  Nursing note and vitals reviewed.    ED Treatments / Results  Labs (all labs ordered are listed, but only abnormal results are displayed) Labs Reviewed  BASIC METABOLIC PANEL - Abnormal; Notable for the following:       Result Value   Glucose, Bld 267 (*)    All other components within normal limits  CBC - Abnormal; Notable for the following:    Hemoglobin 12.9 (*)    HCT 38.7 (*)    All other components within normal limits  I-STAT TROPOININ, ED  I-STAT TROPOININ, ED    EKG  EKG Interpretation  Date/Time:  Tuesday January 02 2016 12:59:00 EDT Ventricular Rate:  94 PR Interval:  140 QRS Duration: 124 QT Interval:  402 QTC Calculation: 502 R Axis:   114 Text Interpretation:  Normal sinus rhythm Right axis deviation Non-specific intra-ventricular conduction delay ST-t wave abnormality Abnormal ekg Confirmed by Carmin Muskrat  MD [phone]) on 01/02/2016 1:06:50 PM Also confirmed by Carmin Muskrat  MD ([id number]), editor [place], Joelene Millin [id number])  on 01/02/2016 1:39:13 PM       Radiology Dg Chest 2 View  Result Date: 01/02/2016 CLINICAL DATA:  Left shoulder pain EXAM: CHEST  2 VIEW COMPARISON:  07/08/2015 FINDINGS: The heart size and mediastinal contours are within normal limits. Both lungs are clear. The visualized skeletal structures are unremarkable. IMPRESSION: No active cardiopulmonary disease. Electronically Signed   By: Franchot Gallo M.D.   On: 01/02/2016 13:40   Dg Shoulder Left  Result Date: 01/02/2016 CLINICAL DATA:  Pain.  No history of trauma EXAM: LEFT SHOULDER - 2+ VIEW COMPARISON:  None. FINDINGS: Frontal, Y scapular, and axillary images were obtained. No fracture or dislocation. Joint spaces appear normal. No erosive  change. IMPRESSION: No fracture or dislocation.  No apparent arthropathy. Electronically Signed   By: Lowella Grip III M.D.   On: 01/02/2016 14:57    Procedures Procedures (including critical care time)  Medications Ordered in ED Medications  aspirin chewable tablet 324 mg (324 mg Oral Given 01/02/16 1350)     Initial Impression / Assessment and Plan / ED Course  I have reviewed the triage vital signs and the nursing notes.  Pertinent labs & imaging results that were available during my care of the patient were reviewed by me and considered in my medical decision making (see chart for details).  Clinical Course    Patient has a history of hypertension, diabetes presents for evaluation of left shoulder pain 1 week.  EKG performed in triage area shows new left bundle branch block as compared to EKG roughly 7 years ago. Physician triage consult to cardiology for evaluation recommendations.  Upon arrival into the room patient  is alert, oriented. Vital signs unremarkable. Patient has full range motion of the shoulder without any deformities or pain on movement or palpation. Patient has normal heart-lung exam.  Concern for cardiomyopathy due to elevated voltage on EKG, new left bundle branch block.  Chest x-ray performed with no acute cardio pulmonary abnormalities, troponin normal. EMP with elevated blood sugar 267, CBC unremarkable.   Cardiologist saw patient, requested echocardiogram, delta troponin. Plan for discharge home following those laboratory and imaging studies.  Patient given aspirin upon arrival due to potential ACS.  If echo, delta troponin okay, can treat as muscle skeletal pain with heating pads, ibuprofen/tylenol.   4:30 PM: Echo study resulted, no significant change from prior. Troponin 2 negative  We'll start lisinopril 10 mg daily per cardiology recommendations. To follow-up with his primary care physician for long-term management of diabetes,  hyperlipidemia.  Patient ambulatory in no acute distress at time of discharge. Encouraged use ibuprofen or Tylenol at home for shoulder pain and avoid any heavy lifting until seen by his primary care physician.  Discussed w/ Dr. Reather Converse.    Final Clinical Impressions(s) / ED Diagnoses   Final diagnoses:  Left shoulder pain  Shoulder pain, acute, left  LBBB (left bundle branch block)    New Prescriptions New Prescriptions   ACETAMINOPHEN (TYLENOL) 500 MG TABLET    Take 1 tablet (500 mg total) by mouth every 6 (six) hours as needed.   LISINOPRIL (PRINIVIL,ZESTRIL) 10 MG TABLET    Take 1 tablet (10 mg total) by mouth daily.     Vira Blanco, MD 01/02/16 1641    Elnora Morrison, MD 01/03/16 [phone]

## 2016-01-02 NOTE — Discharge Instructions (Signed)
Joint Pain  Joint pain can be caused by many things. The joint can be bruised, infected, weak from aging, or sore from exercise. The pain will probably go away if you follow your doctor's instructions for home care. If your joint pain continues, more tests may be needed to help find the cause of your condition.  HOME CARE  Watch your condition for any changes. Follow these instructions as told to lessen the pain that you are feeling:  · Take medicines only as told by your doctor.  · Rest the sore joint for as long as told by your doctor. If your doctor tells you to, raise (elevate) the painful joint above the level of your heart while you are sitting or lying down.  · Do not do things that cause pain or make the pain worse.  · If told, put ice on the painful area:    Put ice in a plastic bag.    Place a towel between your skin and the bag.    Leave the ice on for 20 minutes, 2-3 times per day.  · Wear an elastic bandage, splint, or sling as told by your doctor. Loosen the bandage or splint if your fingers or toes lose feeling (become numb) and tingle, or if they turn cold and blue.  · Begin exercising or stretching the joint as told by your doctor. Ask your doctor what types of exercise are safe for you.  · Keep all follow-up visits as told by your doctor. This is important.  GET HELP IF:  · Your pain gets worse and medicine does not help it.  · Your joint pain does not get better in 3 days.  · You have more bruising or swelling.  · You have a fever.  · You lose 10 pounds (4.5 kg) or more without trying.  GET HELP RIGHT AWAY IF:  · You are not able to move the joint.  · Your fingers or toes become numb or they turn cold and blue.     This information is not intended to replace advice given to you by your health care provider. Make sure you discuss any questions you have with your health care provider.     Document Released: 04/24/2009 Document Revised: 05/27/2014 Document Reviewed: 02/15/2014  Elsevier Interactive  Patient Education ©2016 [employer]

## 2016-01-03 ENCOUNTER — Emergency Department (HOSPITAL_COMMUNITY)
Admission: EM | Admit: 2016-01-03 | Discharge: 2016-01-03 | Disposition: A | Discharge status: Home / Self Care | Payer: Self-pay | Attending: Dermatology | Admitting: Dermatology

## 2016-01-03 ENCOUNTER — Encounter (HOSPITAL_COMMUNITY): Payer: Self-pay

## 2016-01-03 ENCOUNTER — Emergency Department (HOSPITAL_COMMUNITY)
Admission: EM | Admit: 2016-01-03 | Discharge: 2016-01-03 | Disposition: A | Discharge status: Home / Self Care | Payer: Self-pay | Attending: Emergency Medicine | Admitting: Emergency Medicine

## 2016-01-03 DIAGNOSIS — M25512 Pain in left shoulder: Secondary | ICD-10-CM | POA: Insufficient documentation

## 2016-01-03 DIAGNOSIS — M5412 Radiculopathy, cervical region: Secondary | ICD-10-CM | POA: Insufficient documentation

## 2016-01-03 DIAGNOSIS — Z79899 Other long term (current) drug therapy: Secondary | ICD-10-CM | POA: Insufficient documentation

## 2016-01-03 DIAGNOSIS — I10 Essential (primary) hypertension: Secondary | ICD-10-CM | POA: Insufficient documentation

## 2016-01-03 DIAGNOSIS — E119 Type 2 diabetes mellitus without complications: Secondary | ICD-10-CM | POA: Insufficient documentation

## 2016-01-03 DIAGNOSIS — Z5321 Procedure and treatment not carried out due to patient leaving prior to being seen by health care provider: Secondary | ICD-10-CM | POA: Insufficient documentation

## 2016-01-03 DIAGNOSIS — Z794 Long term (current) use of insulin: Secondary | ICD-10-CM | POA: Insufficient documentation

## 2016-01-03 DIAGNOSIS — Z791 Long term (current) use of non-steroidal anti-inflammatories (NSAID): Secondary | ICD-10-CM | POA: Insufficient documentation

## 2016-01-03 MED ORDER — TRAMADOL HCL 50 MG PO TABS: 100.0000 mg | ORAL_TABLET | Freq: Four times a day (QID) | ORAL | 0 refills | Status: DC | PRN

## 2016-01-03 MED ORDER — CYCLOBENZAPRINE HCL 10 MG PO TABS: 10.0000 mg | ORAL_TABLET | Freq: Two times a day (BID) | ORAL | 0 refills | Status: DC | PRN

## 2016-01-03 NOTE — ED Triage Notes (Signed)
0710 Pt called from front lobby . Unable to locate PT.

## 2016-01-03 NOTE — ED Notes (Signed)
Unable to locate PT.

## 2016-01-03 NOTE — ED Triage Notes (Addendum)
Pt c/o intermittent L shoulder pain x over 1 week.  Pain score 5/10.  Pt reports "I was just laying in bed" when pain began.  Pt was seen at [facility] yesterday and full cardiac workup was negative.  Pt LWBS from [facility] earlier today.  Sts prescribed medications are not helping.  Sts "it's getting worser."  Sts difficulty "raising myself up in the bed."

## 2016-01-03 NOTE — ED Notes (Signed)
Patient states he has been having left shoulder pain for the past week. Advised that the only relief he had was when he took a hot shower. Patient advised he was seen at [facility] yesterday and was given a prescription for Tylenol but he brought over the counter instead. Patient advised the pain comes and goes but is worse with movement.

## 2016-01-03 NOTE — ED Provider Notes (Signed)
[place] DEPT Provider Note   CSN: [id number] Arrival date & time: 01/03/16  1314     History   Chief Complaint Chief Complaint  Patient presents with  . Shoulder Pain    HPI [name] is a 45 y.o. male.  HPI Patient has had left shoulder pain for approximately a week. He reports that it comes up across the top of his shoulder and he indicates a line across his trapezius. Sometimes it radiates down into the arm with a sharp pain. He also notes that he has intermittently had a feeling of numbness and tingling. Pain is worse if he is lying on that arm on his side. He notes pain is almost somewhat better if he holds his arm up over his head. He denies any recent new activities. He denies associated chest pain, shortness of breath, cough, fever or chills. He denies lower extremity calf pain or swelling. Patient is diabetic. He reports he is compliant with his medications. No history of PE or lower extremity DVT.  Patient has extensive emergency department evaluation yesterday for cardiac etiology. This includes echocardiogram and cardiology consultation. Patient was started on lisinopril and advised to use ibuprofen or Tylenol for shoulder pain. Past Medical History:  Diagnosis Date  . Diabetes mellitus type II, uncontrolled ([place]) 07/17/2006  . ESSENTIAL HYPERTENSION 07/17/2006  . HYPERLIPIDEMIA 11/24/2008  . Pneumonia 10/22/2011    Patient Active Problem List   Diagnosis Date Noted  . New left bundle branch block (LBBB)   . Influenza A 07/08/2015  . 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 Hyperlipidemia LDL goal <70 11/24/2008  . Diabetes mellitus type 2, uncontrolled ([place]) 07/17/2006  . Essential hypertension 07/17/2006    Past Surgical History:  Procedure Laterality Date  . ARM SURGERY         Home Medications    Prior to Admission medications   Medication Sig Start Date End Date Taking? Authorizing Provider  acetaminophen (TYLENOL) 500 MG tablet  Take 1 tablet (500 mg total) by mouth every 6 (six) hours as needed. 01/02/16   Vira Blanco, MD  cyclobenzaprine (FLEXERIL) 10 MG tablet Take 1 tablet (10 mg total) by mouth 2 (two) times daily as needed for muscle spasms. 01/03/16   Charlesetta Shanks, MD  glucose blood test strip Use to check blood sugar 3 to 4 times daily. 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 Insulin dependent 07/10/15   Maryellen Pile, MD  insulin NPH-regular Human (NOVOLIN 70/30) (70-30) 100 UNIT/ML injection Inject 20 Units into the skin 2 (two) times daily with a meal. 07/10/15   Maryellen Pile, MD  Insulin Pen Needle (EASY COMFORT PEN NEEDLES) 31G X 5 MM MISC 1 Dose by Does not apply route 2 (two) times daily before a meal. Use 1 pen needle for each injection daily. 07/10/15   Maryellen Pile, MD  lisinopril (PRINIVIL,ZESTRIL) 10 MG tablet Take 1 tablet (10 mg total) by mouth daily. 01/02/16 01/16/16  Vira Blanco, MD  naproxen (NAPROSYN) 500 MG tablet Take 1 tablet (500 mg total) by mouth 2 (two) times daily. 10/24/15   Tatyana Kirichenko, PA-C  pravastatin (PRAVACHOL) 40 MG tablet Take 1 tablet (40 mg total) by mouth daily. 07/10/15   Maryellen Pile, MD  traMADol (ULTRAM) 50 MG tablet Take 2 tablets (100 mg total) by mouth every 6 (six) hours as needed. 01/03/16   Charlesetta Shanks, MD    Family History Family History  Problem Relation Age of Onset  . Hypertension Mother 110  . Cancer Mother  bladder cancer  . Alcohol abuse Father   . Hypertension Brother     Social History Social History  Substance Use Topics  . Smoking status: Never Smoker  . Smokeless tobacco: Never Used  . Alcohol use No     Allergies   Sulfa antibiotics; Bactrim [sulfamethoxazole-trimethoprim]; and Cephalexin   Review of Systems Review of Systems 10 Systems reviewed and are negative for acute change except as noted in the HPI.   Physical Exam Updated Vital Signs BP 152/100 (BP Location: Right Arm)   Pulse 94   Temp 98.1 F (36.7 C) (Oral)   Resp 16   Ht  5\' 7"  (1.702 m)   Wt 143 lb (64.9 kg)   SpO2 100%   BMI 22.40 kg/m   Physical Exam  Constitutional: He is oriented to person, place, and time. He appears well-developed and well-nourished. No distress.  HENT:  Head: Normocephalic and atraumatic.  Eyes: Conjunctivae and EOM are normal.  Neck: Neck supple.  Cardiovascular: Normal rate, regular rhythm, normal heart sounds and intact distal pulses.   Pulmonary/Chest: Effort normal and breath sounds normal.  Abdominal: Soft. He exhibits no distension. There is no tenderness.  Musculoskeletal: He exhibits tenderness. He exhibits no edema or deformity.  Patient has reproducible tenderness along the trapezius on the left as well as point tenderness at the top of the scapular margin. He does not have pain over the bony prominences of the shoulder joint. No palpable abnormality. Patient has discomfort with using the arm to support his weight in lying supine or pushing himself up from the stretcher. He can perform full range of motion without significant pain within the joint. He does perceive improvement in discomfort with the arm fully raised above his head. Sensation intact to light touch. Grip strength 5\5. No calf tenderness or peripheral lower extremity edema.  Neurological: He is alert and oriented to person, place, and time. He exhibits normal muscle tone.  Skin: Skin is warm and dry.  Psychiatric: He has a normal mood and affect.     ED Treatments / Results  Labs (all labs ordered are listed, but only abnormal results are displayed) Labs Reviewed - No data to display  EKG  EKG Interpretation None       Radiology Dg Chest 2 View  Result Date: 01/02/2016 CLINICAL DATA:  Left shoulder pain EXAM: CHEST  2 VIEW COMPARISON:  07/08/2015 FINDINGS: The heart size and mediastinal contours are within normal limits. Both lungs are clear. The visualized skeletal structures are unremarkable. IMPRESSION: No active cardiopulmonary disease.  Electronically Signed   By: Franchot Gallo M.D.   On: 01/02/2016 13:40   Dg Shoulder Left  Result Date: 01/02/2016 CLINICAL DATA:  Pain.  No history of trauma EXAM: LEFT SHOULDER - 2+ VIEW COMPARISON:  None. FINDINGS: Frontal, Y scapular, and axillary images were obtained. No fracture or dislocation. Joint spaces appear normal. No erosive change. IMPRESSION: No fracture or dislocation.  No apparent arthropathy. Electronically Signed   By: Lowella Grip III M.D.   On: 01/02/2016 14:57    Procedures Procedures (including critical care time)  Medications Ordered in ED Medications - No data to display   Initial Impression / Assessment and Plan / ED Course  I have reviewed the triage vital signs and the nursing notes.  Pertinent labs & imaging results that were available during my care of the patient were reviewed by me and considered in my medical decision making (see chart for details).  Clinical Course  Final Clinical Impressions(s) / ED Diagnoses   Final diagnoses:  Cervical radiculopathy   Patient has extensive diagnostic evaluation yesterday regarding new LBBB, evaluation was negative for cardiac etiology. He is also seen in consultation by cardiologist yesterday. He presents today again with the same pain in his left shoulder. History and examination are suggestive of musculoskeletal pain with cervical radiculopathy. He describes intermittent episodes of tingling and numbness with pain up in the trapezius and subscapularis. Patient will be advised to use tramadol and Flexeril for symptoms. He had already been instructed and necessity of follow-up with primary care physician for other chronic medical conditions. This is been reinforced. New Prescriptions New Prescriptions   CYCLOBENZAPRINE (FLEXERIL) 10 MG TABLET    Take 1 tablet (10 mg total) by mouth 2 (two) times daily as needed for muscle spasms.   TRAMADOL (ULTRAM) 50 MG TABLET    Take 2 tablets (100 mg total) by mouth  every 6 (six) hours as needed.     Charlesetta Shanks, MD 01/03/16 1438

## 2016-01-03 NOTE — ED Triage Notes (Signed)
Unable to locate PT in front lobby or Sub waiting.

## 2016-01-03 NOTE — ED Triage Notes (Signed)
Pt in triage not wanting to speak or say what CC was; Pt eventually starts to speak to RN and states he is having left  Shoulder pain 10/10; Pt states seen yesterday for same but no relieve with Tylenol giving; Pt a&o 4 on arrival.

## 2016-01-03 NOTE — ED Triage Notes (Signed)
PT called from front lobby and Sub waiting with no answer.

## 2016-01-11 ENCOUNTER — Encounter (HOSPITAL_COMMUNITY): Payer: Self-pay | Admitting: *Deleted

## 2016-01-11 ENCOUNTER — Emergency Department (HOSPITAL_COMMUNITY)
Admission: EM | Admit: 2016-01-11 | Discharge: 2016-01-12 | Disposition: A | Discharge status: Home / Self Care | Payer: Self-pay | Attending: Emergency Medicine | Admitting: Emergency Medicine

## 2016-01-11 ENCOUNTER — Encounter: Payer: Self-pay | Admitting: Internal Medicine

## 2016-01-11 ENCOUNTER — Ambulatory Visit: Payer: Self-pay

## 2016-01-11 DIAGNOSIS — E119 Type 2 diabetes mellitus without complications: Secondary | ICD-10-CM | POA: Insufficient documentation

## 2016-01-11 DIAGNOSIS — Z794 Long term (current) use of insulin: Secondary | ICD-10-CM | POA: Insufficient documentation

## 2016-01-11 DIAGNOSIS — Z79899 Other long term (current) drug therapy: Secondary | ICD-10-CM | POA: Insufficient documentation

## 2016-01-11 DIAGNOSIS — I10 Essential (primary) hypertension: Secondary | ICD-10-CM | POA: Insufficient documentation

## 2016-01-11 DIAGNOSIS — M25512 Pain in left shoulder: Secondary | ICD-10-CM | POA: Insufficient documentation

## 2016-01-11 LAB — CBG MONITORING, ED
Glucose-Capillary: 298 mg/dL — ABNORMAL HIGH (ref 65–99)
Glucose-Capillary: 347 mg/dL — ABNORMAL HIGH (ref 65–99)

## 2016-01-11 MED ORDER — KETOROLAC TROMETHAMINE 60 MG/2ML IM SOLN
60.0000 mg | Freq: Once | INTRAMUSCULAR | Status: AC
  Administered 2016-01-12: 60 mg via INTRAMUSCULAR
  Filled 2016-01-11: qty 2

## 2016-01-11 MED ORDER — IBUPROFEN 600 MG PO TABS: 600.0000 mg | ORAL_TABLET | Freq: Three times a day (TID) | ORAL | 0 refills | Status: DC | PRN

## 2016-01-11 MED ORDER — METHOCARBAMOL 500 MG PO TABS
500.0000 mg | ORAL_TABLET | Freq: Once | ORAL | Status: AC
  Administered 2016-01-12: 500 mg via ORAL
  Filled 2016-01-11: qty 1

## 2016-01-11 MED ORDER — METHOCARBAMOL 500 MG PO TABS: 500.0000 mg | ORAL_TABLET | Freq: Three times a day (TID) | ORAL | 0 refills | Status: DC | PRN

## 2016-01-11 MED ORDER — HYDROCODONE-ACETAMINOPHEN 5-325 MG PO TABS
1.0000 | ORAL_TABLET | Freq: Once | ORAL | Status: AC
  Administered 2016-01-12: 1 via ORAL
  Filled 2016-01-11: qty 1

## 2016-01-11 NOTE — Discharge Instructions (Signed)
Please follow up with your doctor and use the sling for comfort

## 2016-01-11 NOTE — ED Triage Notes (Signed)
Per EMS: pt coming from home with c/o left shoulder pain for a week and a half, denies any trauma or injury, pain started when he woke up one morning. Pt seen for the same in the past. Pt A&Ox4, respirations equal and unlabored, skin warm and dry

## 2016-01-11 NOTE — ED Provider Notes (Signed)
[facility] DEPT Provider Note   CSN: [id number] Arrival date & time: 01/11/16  2218  By signing my name below, I, Dolores Hoose, attest that this documentation has been prepared under the direction and in the presence of Jola Schmidt, MD . Electronically Signed: Dolores Hoose, Scribe. 01/11/2016. 11:10 PM.   History   Chief Complaint Chief Complaint  Patient presents with  . Shoulder Pain  . Hyperglycemia   The history is provided by the patient. No language interpreter was used.     HPI Comments:  [name] is a 45 y.o. male with PMHx of DM who presents to the Emergency Department complaining of constant unchanged left shoulder pain onset 10 days ago. Pt descries the pain as radiating up into the left side of his neck. Pt states his pain is worsened by laying on his back.  He reports he was seen last week in the ER, where he was given painkillers and muscle relaxants with minimal relief. He was supposed to follow up with his PCP but did not make it, due to pain. He denies any recent heavy lifting, history of blood clots, and reports his diabetes is under control.   Past Medical History:  Diagnosis Date  . Diabetes mellitus type II, uncontrolled ([place]) 07/17/2006  . ESSENTIAL HYPERTENSION 07/17/2006  . HYPERLIPIDEMIA 11/24/2008  . Pneumonia 10/22/2011    Patient Active Problem List   Diagnosis Date Noted  . New left bundle branch block (LBBB)   . Influenza A 07/08/2015  . 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 Hyperlipidemia LDL goal <70 11/24/2008  . Diabetes mellitus type 2, uncontrolled ([place]) 07/17/2006  . Essential hypertension 07/17/2006    Past Surgical History:  Procedure Laterality Date  . ARM SURGERY         Home Medications    Prior to Admission medications   Medication Sig Start Date End Date Taking? Authorizing Provider  acetaminophen (TYLENOL) 500 MG tablet Take 1 tablet (500 mg total) by mouth every 6 (six) hours as needed. 01/02/16   Yes Vira Blanco, MD  cyclobenzaprine (FLEXERIL) 10 MG tablet Take 1 tablet (10 mg total) by mouth 2 (two) times daily as needed for muscle spasms. 01/03/16  Yes Charlesetta Shanks, MD  insulin NPH-regular Human (NOVOLIN 70/30) (70-30) 100 UNIT/ML injection Inject 20 Units into the skin 2 (two) times daily with a meal. 07/10/15  Yes Maryellen Pile, MD  lisinopril (PRINIVIL,ZESTRIL) 10 MG tablet Take 1 tablet (10 mg total) by mouth daily. 01/02/16 01/16/16 Yes Vira Blanco, MD  naproxen (NAPROSYN) 500 MG tablet Take 1 tablet (500 mg total) by mouth 2 (two) times daily. 10/24/15  Yes Tatyana Kirichenko, PA-C  pravastatin (PRAVACHOL) 40 MG tablet Take 1 tablet (40 mg total) by mouth daily. 07/10/15  Yes Maryellen Pile, MD  traMADol (ULTRAM) 50 MG tablet Take 2 tablets (100 mg total) by mouth every 6 (six) hours as needed. 01/03/16  Yes Charlesetta Shanks, MD  glucose blood test strip Use to check blood sugar 3 to 4 times daily. 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 Insulin dependent 07/10/15   Maryellen Pile, MD  ibuprofen (ADVIL,MOTRIN) 600 MG tablet Take 1 tablet (600 mg total) by mouth every 8 (eight) hours as needed. 01/11/16   Jola Schmidt, MD  Insulin Pen Needle (EASY COMFORT PEN NEEDLES) 31G X 5 MM MISC 1 Dose by Does not apply route 2 (two) times daily before a meal. Use 1 pen needle for each injection daily. 07/10/15   Maryellen Pile, MD  methocarbamol (ROBAXIN) 500 MG tablet Take 1 tablet (500 mg total) by mouth every 8 (eight) hours as needed for muscle spasms. 01/11/16   Jola Schmidt, MD    Family History Family History  Problem Relation Age of Onset  . Hypertension Mother 62  . Cancer Mother     bladder cancer  . Alcohol abuse Father   . Hypertension Brother     Social History Social History  Substance Use Topics  . Smoking status: Never Smoker  . Smokeless tobacco: Never Used  . Alcohol use No     Allergies   Sulfa antibiotics; Bactrim [sulfamethoxazole-trimethoprim]; and Cephalexin   Review of  Systems Review of Systems  10 Systems reviewed and are negative for acute change except as noted in the HPI.  Physical Exam Updated Vital Signs BP 122/87   Pulse 104   Temp 97.7 F (36.5 C) (Oral)   Resp 18   Ht 5\' 7"  (1.702 m)   Wt 143 lb (64.9 kg)   SpO2 99%   BMI 22.40 kg/m   Physical Exam  Constitutional: He is oriented to person, place, and time. He appears well-developed and well-nourished.  HENT:  Head: Normocephalic.  Eyes: EOM are normal.  Neck: Normal range of motion.  Pulmonary/Chest: Effort normal.  Abdominal: He exhibits no distension.  Musculoskeletal: Normal range of motion.  Able to fully range the left shoulder with passive range of motion.  Active range of motion of left shoulder causes discomfort.  Normal left radial pulse.  Normal grip strength left hand.  No erythema warmth or significant swelling of the left shoulder.  No obvious deformity.  Left clavicle normal.  Neurological: He is alert and oriented to person, place, and time.  Psychiatric: He has a normal mood and affect.  Nursing note and vitals reviewed.  ED Treatments / Results  DIAGNOSTIC STUDIES:  Oxygen Saturation is 99% on RA, normal by my interpretation.    COORDINATION OF CARE:  11:35 PM Discussed treatment plan with pt at bedside which included antiinflammatories and muscle relaxants and pt agreed to plan.  Labs (all labs ordered are listed, but only abnormal results are displayed) Labs Reviewed  CBG MONITORING, ED - Abnormal; Notable for the following:       Result Value   Glucose-Capillary 298 (*)    All other components within normal limits  CBG MONITORING, ED - Abnormal; Notable for the following:    Glucose-Capillary 347 (*)    All other components within normal limits    EKG  EKG Interpretation None       Radiology No results found.  Procedures Procedures (including critical care time)  Medications Ordered in ED Medications  ketorolac (TORADOL) injection 60  mg (60 mg Intramuscular Given 01/12/16 0005)  HYDROcodone-acetaminophen (NORCO/VICODIN) 5-325 MG per tablet 1 tablet (1 tablet Oral Given 01/12/16 0005)  methocarbamol (ROBAXIN) tablet 500 mg (500 mg Oral Given 01/12/16 0005)     Initial Impression / Assessment and Plan / ED Course  I have reviewed the triage vital signs and the nursing notes.  Pertinent labs & imaging results that were available during my care of the patient were reviewed by me and considered in my medical decision making (see chart for details).  Clinical Course    Patient is overall well-appearing.  This seems to be musculoskeletal left shoulder pain.  X-rays previously performed demonstrate no abnormality.  Sling for comfort.  Primary care follow-up.  Home with ibuprofen and muscle relaxants.  Normal radial pulse.  Normal grip strength in his left hand.  No swelling of his left upper extremity as compared to his right.  Doubt DVT.  Patient with known diabetes.  I've asked that he better manage his blood sugars.  Final Clinical Impressions(s) / ED Diagnoses   Final diagnoses:  Left shoulder pain    New Prescriptions New Prescriptions   IBUPROFEN (ADVIL,MOTRIN) 600 MG TABLET    Take 1 tablet (600 mg total) by mouth every 8 (eight) hours as needed.   METHOCARBAMOL (ROBAXIN) 500 MG TABLET    Take 1 tablet (500 mg total) by mouth every 8 (eight) hours as needed for muscle spasms.   I personally performed the services described in this documentation, which was scribed in my presence. The recorded information has been reviewed and is accurate.        Jola Schmidt, MD 01/12/16 [phone]

## 2016-01-16 ENCOUNTER — Ambulatory Visit (INDEPENDENT_AMBULATORY_CARE_PROVIDER_SITE_OTHER): Payer: Self-pay | Admitting: Internal Medicine

## 2016-01-16 DIAGNOSIS — M25512 Pain in left shoulder: Secondary | ICD-10-CM

## 2016-01-16 DIAGNOSIS — N529 Male erectile dysfunction, unspecified: Secondary | ICD-10-CM

## 2016-01-16 DIAGNOSIS — N528 Other male erectile dysfunction: Secondary | ICD-10-CM

## 2016-01-16 MED ORDER — NAPROXEN 500 MG PO TABS
500.0000 mg | ORAL_TABLET | Freq: Two times a day (BID) | ORAL | 0 refills | Status: DC
Start: 2016-01-16 — End: 2016-01-26

## 2016-01-16 MED ORDER — SILDENAFIL CITRATE 20 MG PO TABS: 40.0000 mg | ORAL_TABLET | ORAL | 0 refills | Status: DC | PRN

## 2016-01-16 NOTE — Assessment & Plan Note (Signed)
Patient is here for 2 week duration of left shoulder pain. He had gone to the ER and xrays were done which were negative for any etiology. He describes it as throbbing pain, from shoulder to elbow. Laying down makes it worse. Nothing seems to make it better. He has tried flexeril and robaxin without relief. He has not tried scheduled NSAIDs.  On exam, there is tenderness to palpation over anterior shoulder. Full ROM. No weakness. Normal muscle tone and 5/5 strength in upper extremities. Normal active and passive ROM  A: most likely subacromial bursitis  Plan -given naproxen 500 mg BID with meals for 2-3 weeks -Explained that he can return to clinic if pain does not improve and we can try steroid injection. I explained that the pain may take few weeks to go down. -heating pads and ice

## 2016-01-16 NOTE — Progress Notes (Signed)
   CC: left shoulder pain and ED HPI: [name] is a 45 y.o. man with PMH noted below here for left shoulder pain and ED  Please see Problem List/A&P for the status of the patient's chronic medical problems   Past Medical History:  Diagnosis Date  . Diabetes mellitus type II, uncontrolled ([place]) 07/17/2006  . ESSENTIAL HYPERTENSION 07/17/2006  . HYPERLIPIDEMIA 11/24/2008  . Pneumonia 10/22/2011    Review of Systems: Denies fevers, fatigue Denies SOB with exertion, CP, cough Denies n/v/abd pain Denies dysuria, hematuria, urinary hesitancy or frequency Has continuing left shoulder pain. Denies weakness or numbness   Physical Exam: Vitals:   01/16/16 1433  BP: (!) 128/92  Pulse: (!) 108  Temp: 97.8 F (36.6 C)  TempSrc: Oral  SpO2: 100%  Weight: 138 lb 12.8 oz (63 kg)  Height: 5\' 7"  (1.702 m)    General: A&O, in NAD CV: RRR, normal s1, s2, no m/r/g Resp: equal and symmetric breath sounds, no wheezing or crackles  Abdomen: soft, nontender, nondistended, +BS Shoulder: tenderness to palpation over anterior shoulder. Full ROM. No weakness. 5/5 strength in upper extremities. Normal muscle tone.  GU: prostate normal, no enlargement.   Assessment & Plan:   See encounters tab for problem based medical decision making. Patient discussed with Dr. Lynnae January

## 2016-01-16 NOTE — Assessment & Plan Note (Signed)
Patient says that he is not able to have an erection for few months. He does endorse nocturnal erection.  He denies symptoms of BPH. He has had longstanding diabetes for many years. On exam, Normal GU exam and normal prostate  Assessment: erectile dysfunction, most likely vascular   Plan -prescribed revatio PRN -explained to stop taking if he feels dizzy or lightheaded.

## 2016-01-16 NOTE — Patient Instructions (Signed)
Thank you for your visit today  Please take the naproxen twice a day for 2-3 weeks. Come back if your pain persists after that or if symptoms worsen.  Please take the sildenafil (revatio) as needed

## 2016-01-17 NOTE — Progress Notes (Signed)
Internal Medicine Clinic Attending  Case discussed with Dr. Saraiya at the time of the visit.  We reviewed the resident's history and exam and pertinent patient test results.  I agree with the assessment, diagnosis, and plan of care documented in the resident's note.  

## 2016-01-26 ENCOUNTER — Emergency Department (HOSPITAL_COMMUNITY)
Admission: EM | Admit: 2016-01-26 | Discharge: 2016-01-26 | Disposition: A | Discharge status: Home / Self Care | Payer: Self-pay | Attending: Emergency Medicine | Admitting: Emergency Medicine

## 2016-01-26 ENCOUNTER — Encounter (HOSPITAL_COMMUNITY): Payer: Self-pay

## 2016-01-26 DIAGNOSIS — Z794 Long term (current) use of insulin: Secondary | ICD-10-CM | POA: Insufficient documentation

## 2016-01-26 DIAGNOSIS — M25512 Pain in left shoulder: Secondary | ICD-10-CM | POA: Insufficient documentation

## 2016-01-26 DIAGNOSIS — G8929 Other chronic pain: Secondary | ICD-10-CM | POA: Insufficient documentation

## 2016-01-26 DIAGNOSIS — Z79899 Other long term (current) drug therapy: Secondary | ICD-10-CM | POA: Insufficient documentation

## 2016-01-26 DIAGNOSIS — E119 Type 2 diabetes mellitus without complications: Secondary | ICD-10-CM | POA: Insufficient documentation

## 2016-01-26 DIAGNOSIS — I10 Essential (primary) hypertension: Secondary | ICD-10-CM | POA: Insufficient documentation

## 2016-01-26 MED ORDER — IBUPROFEN 800 MG PO TABS: 800.0000 mg | ORAL_TABLET | Freq: Three times a day (TID) | ORAL | 0 refills | Status: DC | PRN

## 2016-01-26 MED ORDER — IBUPROFEN 800 MG PO TABS
800.0000 mg | ORAL_TABLET | Freq: Once | ORAL | Status: AC
  Administered 2016-01-26: 800 mg via ORAL
  Filled 2016-01-26: qty 1

## 2016-01-26 NOTE — ED Provider Notes (Signed)
TIME SEEN: 2:30 AM  CHIEF COMPLAINT: Left shoulder pain  HPI: Patient is a 45 year old male with history of hypertension, diabetes and hyperlipidemia who presents emergency department left shoulder pain that he has had for several weeks. This is his fifth visit to the emergency department and our system for the same. Denies any known injury. Has been given tramadol, anti-inflammatories, muscle relaxers. Has not followed up with anyone as an outpatient. No numbness or focal weakness. No chest pain or shortness of breath. Pain worse with movement and palpation.  ROS: See HPI Constitutional: no fever  Eyes: no drainage  ENT: no runny nose   Cardiovascular:  no chest pain  Resp: no SOB  GI: no vomiting GU: no dysuria Integumentary: no rash  Allergy: no hives  Musculoskeletal: no leg swelling  Neurological: no slurred speech ROS otherwise negative  PAST MEDICAL HISTORY/PAST SURGICAL HISTORY:  Past Medical History:  Diagnosis Date  . Diabetes mellitus type II, uncontrolled ([place]) 07/17/2006  . ESSENTIAL HYPERTENSION 07/17/2006  . HYPERLIPIDEMIA 11/24/2008  . Pneumonia 10/22/2011    MEDICATIONS:  Prior to Admission medications   Medication Sig Start Date End Date Taking? Authorizing Provider  acetaminophen (TYLENOL) 500 MG tablet Take 1 tablet (500 mg total) by mouth every 6 (six) hours as needed. 01/02/16   Vira Blanco, MD  glucose blood test strip Use to check blood sugar 3 to 4 times daily. 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 Insulin dependent 07/10/15   Maryellen Pile, MD  ibuprofen (ADVIL,MOTRIN) 800 MG tablet Take 1 tablet (800 mg total) by mouth every 8 (eight) hours as needed for mild pain. 01/26/16   Denecia Brunette N Dhwani Venkatesh, DO  insulin NPH-regular Human (NOVOLIN 70/30) (70-30) 100 UNIT/ML injection Inject 20 Units into the skin 2 (two) times daily with a meal. 07/10/15   Maryellen Pile, MD  Insulin Pen Needle (EASY COMFORT PEN NEEDLES) 31G X 5 MM MISC 1 Dose by Does not apply route 2 (two) times daily before a  meal. Use 1 pen needle for each injection daily. 07/10/15   Maryellen Pile, MD  lisinopril (PRINIVIL,ZESTRIL) 10 MG tablet Take 1 tablet (10 mg total) by mouth daily. 01/02/16 01/16/16  Vira Blanco, MD  pravastatin (PRAVACHOL) 40 MG tablet Take 1 tablet (40 mg total) by mouth daily. 07/10/15   Maryellen Pile, MD  sildenafil (REVATIO) 20 MG tablet Take 2 tablets (40 mg total) by mouth as needed. Before sexual activity 01/16/16   Burgess Estelle, MD    ALLERGIES:  Allergies  Allergen Reactions  . Sulfa Antibiotics Rash    Severe rash. Do not give sulfa medications  . Bactrim [Sulfamethoxazole-Trimethoprim]     Rash  . Cephalexin     Patient with severe drug reaction including exfoliating skin rash and hypotension after being prescribed both cephalexin and sulfamethoxazole-trimethoprim simultaneously. Favor SMX as much more likely culprit as patient had tolerated other cephalosporins in the past, but cannot say with complete certainty that this was not related to cephalexin.     SOCIAL HISTORY:  Social History  Substance Use Topics  . Smoking status: Never Smoker  . Smokeless tobacco: Never Used  . Alcohol use No    FAMILY HISTORY: Family History  Problem Relation Age of Onset  . Hypertension Mother 76  . Cancer Mother     bladder cancer  . Alcohol abuse Father   . Hypertension Brother     EXAM: BP 151/97 (BP Location: Right Arm)   Pulse 103   Temp 97.8 F (36.6 C) (Oral)  Resp 18   Ht 5\' 7"  (1.702 m)   Wt 140 lb (63.5 kg)   SpO2 97%   BMI 21.93 kg/m  CONSTITUTIONAL: Alert and oriented and responds appropriately to questions. Well-appearing; well-nourished HEAD: Normocephalic EYES: Conjunctivae clear, PERRL ENT: normal nose; no rhinorrhea; moist mucous membranes NECK: Supple, no meningismus, no LAD, No midline spinal tenderness or step-off or deformity  CARD: RRR; S1 and S2 appreciated; no murmurs, no clicks, no rubs, no gallops RESP: Normal chest excursion without  splinting or tachypnea; breath sounds clear and equal bilaterally; no wheezes, no rhonchi, no rales, no hypoxia or respiratory distress, speaking full sentences ABD/GI: Normal bowel sounds; non-distended; soft, non-tender, no rebound, no guarding, no peritoneal signs BACK:  The back appears normal and is non-tender to palpation, there is no CVA tenderness EXT: Tender to palpation over the left lateral shoulder at the insertion of the deltoid. There is no ecchymosis, swelling, joint effusion. No loss of fullness of this joint. He has full range of motion but has difficulty lifting his arm above his head which causes him the most pain. Compartments are soft. 2+ radial pulses bilaterally. No erythema or warmth of the joint. Normal ROM in all joints; the right external is are non-tender to palpation; no edema; normal capillary refill; no cyanosis, no calf tenderness or swelling    SKIN: Normal color for age and race; warm; no rash NEURO: Moves all extremities equally, sensation to light touch intact diffusely, cranial nerves II through XII intact PSYCH: The patient's mood and manner are appropriate. Grooming and personal hygiene are appropriate.  MEDICAL DECISION MAKING: Patient here with chronic left shoulder pain. He has now been in the emergency department 5 times within our system for the same. Discussed with patient that I can prescribe anti-inflammatories for pain and have recommended rest, elevation and ice. At this point I feel he needs to see a specialist. Have provided him with multiple different orthopedic doctors to follow-up with. He states he does have a PCP. I do not think he needs narcotics are further muscle relaxers at this time. No sign of septic arthritis, gout, shoulder dislocation or fracture on exam. Recently had an x-ray in August 15 which was unremarkable. I do not think this needs to be repeated given no injury. He is neurovascularly intact distally. I feel he is safe to be discharged  home. He is comfortable with this plan.   At this time, I do not feel there is any life-threatening condition present. I have reviewed and discussed all results (EKG, imaging, lab, urine as appropriate), exam findings with patient/family. I have reviewed nursing notes and appropriate previous records.  I feel the patient is safe to be discharged home without further emergent workup and can continue workup as an outpatient as needed. Discussed usual and customary return precautions. Patient/family verbalize understanding and are comfortable with this plan.  Outpatient follow-up has been provided. All questions have been answered.        [place], DO 01/26/16 [phone]

## 2016-01-26 NOTE — Discharge Instructions (Signed)
You need to schedule an appointment to see an orthopedic physician for your shoulder pain. It is not appropriate to continue to come to the emergency department for pain control for this given this has been going on for 5 weeks. Please schedule appointment to follow-up as an outpatient.

## 2016-01-26 NOTE — ED Triage Notes (Signed)
Left shoulder pain x 2 weeks, denies injury or trauma, pain worse with movement.

## 2016-03-26 ENCOUNTER — Encounter: Payer: Self-pay | Admitting: Internal Medicine

## 2016-06-18 ENCOUNTER — Telehealth: Payer: Self-pay | Admitting: *Deleted

## 2016-06-18 ENCOUNTER — Other Ambulatory Visit (INDEPENDENT_AMBULATORY_CARE_PROVIDER_SITE_OTHER): Payer: Self-pay

## 2016-06-18 DIAGNOSIS — E1065 Type 1 diabetes mellitus with hyperglycemia: Secondary | ICD-10-CM

## 2016-06-18 DIAGNOSIS — IMO0002 Reserved for concepts with insufficient information to code with codable children: Secondary | ICD-10-CM

## 2016-06-18 DIAGNOSIS — E1365 Other specified diabetes mellitus with hyperglycemia: Secondary | ICD-10-CM

## 2016-06-18 LAB — GLUCOSE, CAPILLARY: Glucose-Capillary: 451 mg/dL — ABNORMAL HIGH (ref 65–99)

## 2016-06-18 LAB — POCT GLYCOSYLATED HEMOGLOBIN (HGB A1C): Hemoglobin A1C: >14

## 2016-06-18 NOTE — Telephone Encounter (Signed)
Review of the chart raises the concern he may have adult onset autoimmune diabetes given his body habitus, previously reported insulin sensitivity, and relatively young age.  That being said, he still must have some intrinsic islet cell function as he is clinically not in DKA after 6 months of no insulin therapy.  Finances seem to be a real issue so I will limit evaluation at this time given the potential financial hardships it could induce.  I will not give him insulin at this time as he needs to show up for his appointment as scheduled on 2/13 and I am concerned his incentive to do so will diminish if he has some insulin in his possession.  The last time he had his diabetes addressed in the clinic was April 2016 so long term management of this poorly controlled chronic medical condition is required.  When he was seen in the [facility] in August 2017 the diabetes was not addressed apparently because it was not his presenting complaint.  We confirmed his contact information and will obtain a Hgb A1C so that it is ready for his February 13th appointment.  I will also obtain a urine for microalbumin since it is overdue and this information will also be available at the follow-up appointment.  He had a BMP in the fall that was relatively unremarkable and I will not saddle him with the cost of a repeat as it will not impact upon his management acutely.  I also do not believe he will benefit from a repeat lipid panel, but requires high intensity statin therapy empirically given his long standing poorly controlled diabetes.  The statin can be discussed at the follow-up visit.

## 2016-06-18 NOTE — Progress Notes (Addendum)
Glucose 451 Hgb A1C > 14.0  Results not unexpected given no insulin therapy for > 6 months per patient report.  Urine microalbumin pending  Management of his diabetes at his primary care provider visit on February 13.

## 2016-06-18 NOTE — Telephone Encounter (Signed)
WALK IN Pt walks in today and wants insulin filled today, he states he will come to his appt 2/13 BUT he has not taken any insulin in more than 6 months, he has missed appts and states he didn't have the money to buy insulin and thought I might give him some. There are no appts left in [facility] today but could we at least get some labs? Please advise

## 2016-06-19 LAB — MICROALBUMIN / CREATININE URINE RATIO
Creatinine, Urine: 25.7 mg/dL
Microalb/Creat Ratio: 54.9 mg/g creat — ABNORMAL HIGH (ref 0.0–30.0)
Microalbumin, Urine: 14.1 ug/mL

## 2016-06-19 NOTE — Progress Notes (Signed)
Patient ID: [name], male   DOB: 1970/07/23, 46 y.o.   MRN: [id number]  Urine creatinine 25.7 Urine albumin 14.1 Microalbumin/creatinine ratio 54.9  The ratio is elevated and slightly more than twice the last value 1 year ago.  It appears the plan was to start an ACEI if his ratio was elevated, thus this can also be started during his appointment on February 13.

## 2016-07-01 NOTE — Progress Notes (Signed)
   CC: Hypertension  HPI:  [name] is a 46 y.o. man with PMHx as noted below who presents today for follow up of his hypertension.  HTN: BP 134/93 today. He has not been taking Lisinopril 10 mg daily due to running out of the medication.   Type 2 DM: Last A1c 15 in Feb 2017 and then recently rechecked on 06/18/16 and was >14. He has not been seen in clinic for his diabetes since April 2016. He reports he ran out of his Novolin 70/30 about 4 weeks ago. Blood sugars have been in the 300s per his report. He did not bring his glucometer today. Reports he was taking 30 units BID with meals. He reports polydipsia and polyuria. Denies blurry vision, nausea, vomiting, and abdominal pain. He denies any hypoglycemia.   Feet/Hand Tingling: He notes his hands and feet have been tingling for the last 2 months, which is new for him. He denies any associated weakness. He reports the tingling comes and goes. He denies any interference with his work. He states it is more of a nuisance.   Past Medical History:  Diagnosis Date  . Diabetes mellitus type II, uncontrolled ([place]) 07/17/2006  . ESSENTIAL HYPERTENSION 07/17/2006  . HYPERLIPIDEMIA 11/24/2008  . Pneumonia 10/22/2011    Review of Systems:   General: Denies fever, chills, night sweats, changes in weight, changes in appetite HEENT: Denies headaches, ear pain, rhinorrhea, sore throat CV: Denies CP, palpitations, SOB, orthopnea Pulm: Denies SOB, cough, wheezing GI: Denies diarrhea, constipation, melena, hematochezia GU: Denies dysuria, hematuria Msk: Denies muscle cramps, joint pains Neuro: Denies weakness Skin: Denies rashes, bruising Psych: Denies depression, anxiety, hallucinations  Physical Exam:  Vitals:   07/02/16 1336  BP: (!) 134/93  Pulse: 99  Temp: 98.4 F (36.9 C)  TempSrc: Oral  SpO2: 98%  Weight: 142 lb 9.6 oz (64.7 kg)   General: middle aged man sitting up, NAD HEENT: [place]/AT, EOMI, sclera anicteric, mucus membranes  moist CV: RRR, no m/g/r Pulm: CTA bilaterally, breaths non-labored  Ext: warm, no peripheral edema, distal pulses 2+, normal tone and bulk Neuro: alert and oriented x 3 Skin: no rashes. No sores present on feet.  Assessment & Plan:   See Encounters Tab for problem based charting.  Patient discussed with Dr. Angelia Mould

## 2016-07-02 ENCOUNTER — Encounter: Payer: Self-pay | Admitting: Internal Medicine

## 2016-07-02 ENCOUNTER — Ambulatory Visit (INDEPENDENT_AMBULATORY_CARE_PROVIDER_SITE_OTHER): Payer: Self-pay | Admitting: Internal Medicine

## 2016-07-02 DIAGNOSIS — E1165 Type 2 diabetes mellitus with hyperglycemia: Secondary | ICD-10-CM

## 2016-07-02 DIAGNOSIS — I10 Essential (primary) hypertension: Secondary | ICD-10-CM

## 2016-07-02 DIAGNOSIS — Z8679 Personal history of other diseases of the circulatory system: Secondary | ICD-10-CM

## 2016-07-02 DIAGNOSIS — Z23 Encounter for immunization: Secondary | ICD-10-CM

## 2016-07-02 DIAGNOSIS — R202 Paresthesia of skin: Secondary | ICD-10-CM

## 2016-07-02 DIAGNOSIS — Z9114 Patient's other noncompliance with medication regimen: Secondary | ICD-10-CM

## 2016-07-02 DIAGNOSIS — Z0000 Encounter for general adult medical examination without abnormal findings: Secondary | ICD-10-CM | POA: Insufficient documentation

## 2016-07-02 DIAGNOSIS — E1129 Type 2 diabetes mellitus with other diabetic kidney complication: Secondary | ICD-10-CM

## 2016-07-02 DIAGNOSIS — E1142 Type 2 diabetes mellitus with diabetic polyneuropathy: Secondary | ICD-10-CM | POA: Insufficient documentation

## 2016-07-02 DIAGNOSIS — R808 Other proteinuria: Secondary | ICD-10-CM

## 2016-07-02 DIAGNOSIS — Z794 Long term (current) use of insulin: Secondary | ICD-10-CM

## 2016-07-02 LAB — GLUCOSE, CAPILLARY: Glucose-Capillary: 418 mg/dL — ABNORMAL HIGH (ref 65–99)

## 2016-07-02 MED ORDER — INSULIN NPH ISOPHANE & REGULAR (70-30) 100 UNIT/ML ~~LOC~~ SUSP: 30.0000 [IU] | Freq: Two times a day (BID) | SUBCUTANEOUS | 5 refills | Status: DC

## 2016-07-02 MED ORDER — LISINOPRIL 5 MG PO TABS
5.0000 mg | ORAL_TABLET | Freq: Every day | ORAL | 5 refills | Status: DC
Start: 2016-07-02 — End: 2017-02-08

## 2016-07-02 MED ORDER — LISINOPRIL 5 MG PO TABS: 5.0000 mg | ORAL_TABLET | Freq: Every day | ORAL | 5 refills | Status: DC

## 2016-07-02 MED ORDER — PRAVASTATIN SODIUM 40 MG PO TABS: 40.0000 mg | ORAL_TABLET | Freq: Every day | ORAL | 5 refills | Status: DC

## 2016-07-02 MED ORDER — GABAPENTIN 100 MG PO CAPS: 100.0000 mg | ORAL_CAPSULE | Freq: Three times a day (TID) | ORAL | 1 refills | Status: DC

## 2016-07-02 NOTE — Assessment & Plan Note (Signed)
Requested flu shot today and this was administered.

## 2016-07-02 NOTE — Assessment & Plan Note (Signed)
His diabetes has been difficult to control as he has not had good follow up due to his job and not taking medications as directed. His job requires him to travel for several months at a time (works for [employer]) and he has difficulty keeping doctor's appointments. We discussed that despite these difficulties that he needs to follow up when he is able to. He has agreed to follow up at the end of March when he is back in town. Will continue Novolin 70/30 30 units BID with meals for now and reassess at next visit based on blood sugars. He reports he has testing supplies at home. I advised to test 3-4 times daily as he can afford. He is trying to get the orange card and I advised him to bring that necessary paperwork to his next visit so we can refer him to Ophthalmology. He declined referral today as he is going out of town tomorrow until end of March. He believes he has never had an eye exam. Advised him to start Lisinopril 5 mg daily for his proteinuria. Will obtain a bmet today. His Pravastatin was also refilled.

## 2016-07-02 NOTE — Assessment & Plan Note (Signed)
Describes a 2 month hx of tingling in his hands and feet that is most consistent with peripheral neuropathy from his extremely uncontrolled diabetes. Will have him start Gabapentin 100 mg TID and titrate up as needed. Will check bmet today as may need to be renally dosed in the future if his blood sugar control does not improve.

## 2016-07-02 NOTE — Patient Instructions (Addendum)
General Instructions: - Pick up prescriptions for Novolog 70/30, pravastatin, and lisinopril - Start Novolog 70/30 30 units twice daily with meals - Check blood sugars 3-4 times daily - Please return to clinic as soon as possible so we can see how your blood sugars are doing- make an appointment for end of March - Collect paperwork and bring to next visit so you can get orange card  Please bring your medicines with you each time you come to clinic.  Medicines may include prescription medications, over-the-counter medications, herbal remedies, eye drops, vitamins, or other pills.   Progress Toward Treatment Goals:  Treatment Goal 09/06/2014  Hemoglobin A1C deteriorated  Blood pressure at goal    Self Care Goals & Plans:  Self Care Goal 09/06/2014  Manage my medications take my medicines as prescribed; bring my medications to every visit; refill my medications on time  Monitor my health keep track of my blood glucose; bring my glucose meter and log to each visit; check my feet daily  Eat healthy foods eat baked foods instead of fried foods; drink diet soda or water instead of juice or soda; eat foods that are low in salt  Be physically active find an activity I enjoy; take a walk every day    Home Blood Glucose Monitoring 09/06/2014  Check my blood sugar 2 times a day  When to check my blood sugar before breakfast; before dinner     Care Management & Community Referrals:  Referral 09/06/2014  Referrals made for care management support diabetes educator; nutritionist

## 2016-07-02 NOTE — Assessment & Plan Note (Addendum)
BP controlled while not on medication for likely over a year. He does not have hypertension. Advised him to start Lisinopril 5 mg daily for his proteinuria related to his diabetes. Obtain bmet today.

## 2016-07-03 LAB — BMP8+ANION GAP
Anion Gap: 20 mmol/L — ABNORMAL HIGH (ref 10.0–18.0)
BUN/Creatinine Ratio: 13 (ref 9–20)
BUN: 11 mg/dL (ref 6–24)
CO2: 24 mmol/L (ref 18–29)
Calcium: 9.6 mg/dL (ref 8.7–10.2)
Chloride: 96 mmol/L (ref 96–106)
Creatinine, Ser: 0.85 mg/dL (ref 0.76–1.27)
GFR calc Af Amer: 123 mL/min/{1.73_m2} (ref >59)
GFR calc non Af Amer: 107 mL/min/{1.73_m2} (ref >59)
Glucose: 353 mg/dL — ABNORMAL HIGH (ref 65–99)
Potassium: 4.5 mmol/L (ref 3.5–5.2)
Sodium: 140 mmol/L (ref 134–144)

## 2016-07-04 NOTE — Progress Notes (Signed)
Internal Medicine Clinic Attending  Case discussed with Dr. Rivet soon after the resident saw the patient.  We reviewed the resident's history and exam and pertinent patient test results.  I agree with the assessment, diagnosis, and plan of care documented in the resident's note.  

## 2016-07-09 ENCOUNTER — Ambulatory Visit: Payer: Self-pay

## 2016-07-10 ENCOUNTER — Emergency Department (HOSPITAL_COMMUNITY)
Admission: EM | Admit: 2016-07-10 | Discharge: 2016-07-11 | Disposition: A | Discharge status: Home / Self Care | Payer: Self-pay | Attending: Emergency Medicine | Admitting: Emergency Medicine

## 2016-07-10 ENCOUNTER — Encounter (HOSPITAL_COMMUNITY): Payer: Self-pay

## 2016-07-10 DIAGNOSIS — E1165 Type 2 diabetes mellitus with hyperglycemia: Secondary | ICD-10-CM | POA: Insufficient documentation

## 2016-07-10 DIAGNOSIS — R739 Hyperglycemia, unspecified: Secondary | ICD-10-CM

## 2016-07-10 DIAGNOSIS — I10 Essential (primary) hypertension: Secondary | ICD-10-CM | POA: Insufficient documentation

## 2016-07-10 DIAGNOSIS — Z794 Long term (current) use of insulin: Secondary | ICD-10-CM | POA: Insufficient documentation

## 2016-07-10 LAB — CBG MONITORING, ED: Glucose-Capillary: 516 mg/dL (ref 65–99)

## 2016-07-10 NOTE — ED Triage Notes (Addendum)
Pt endorse "I think my blood sugar is up because I've been without my insulin for a month due to financial reasons." Pt states that he last checked his Blood sugar last week and it was over 400. Pt endorses frequent thirst and urination. VSS. Pt denies n/v. CBG 516

## 2016-07-11 ENCOUNTER — Telehealth: Payer: Self-pay | Admitting: Internal Medicine

## 2016-07-11 ENCOUNTER — Telehealth: Payer: Self-pay | Admitting: Dietician

## 2016-07-11 LAB — CBG MONITORING, ED
Glucose-Capillary: 381 mg/dL — ABNORMAL HIGH (ref 65–99)
Glucose-Capillary: 122 mg/dL — ABNORMAL HIGH (ref 65–99)

## 2016-07-11 LAB — I-STAT VENOUS BLOOD GAS, ED
Acid-Base Excess: 3 mmol/L — ABNORMAL HIGH (ref 0.0–2.0)
Bicarbonate: 28.6 mmol/L — ABNORMAL HIGH (ref 20.0–28.0)
O2 Saturation: 66 %
TCO2: 30 mmol/L (ref 0–100)
pCO2, Ven: 48 mmHg (ref 44.0–60.0)
pH, Ven: 7.383 (ref 7.250–7.430)
pO2, Ven: 35 mmHg (ref 32.0–45.0)

## 2016-07-11 LAB — URINALYSIS, ROUTINE W REFLEX MICROSCOPIC
Bacteria, UA: NONE SEEN
Bilirubin Urine: NEGATIVE
Glucose, UA: >=500 mg/dL — AB
Ketones, ur: NEGATIVE mg/dL
Leukocytes, UA: NEGATIVE
Nitrite: NEGATIVE
Protein, ur: NEGATIVE mg/dL
Specific Gravity, Urine: 1.029 (ref 1.005–1.030)
Squamous Epithelial / LPF: NONE SEEN
pH: 6 (ref 5.0–8.0)

## 2016-07-11 LAB — BASIC METABOLIC PANEL
Anion gap: 10 (ref 5–15)
BUN: 13 mg/dL (ref 6–20)
CO2: 27 mmol/L (ref 22–32)
Calcium: 9.3 mg/dL (ref 8.9–10.3)
Chloride: 98 mmol/L — ABNORMAL LOW (ref 101–111)
Creatinine, Ser: 0.97 mg/dL (ref 0.61–1.24)
GFR calc Af Amer: >60 mL/min (ref >60)
GFR calc non Af Amer: >60 mL/min (ref >60)
Glucose, Bld: 577 mg/dL (ref 65–99)
Potassium: 4.1 mmol/L (ref 3.5–5.1)
Sodium: 135 mmol/L (ref 135–145)

## 2016-07-11 LAB — CBC
HCT: 38.2 % — ABNORMAL LOW (ref 39.0–52.0)
Hemoglobin: 12.5 g/dL — ABNORMAL LOW (ref 13.0–17.0)
MCH: 26.8 pg (ref 26.0–34.0)
MCHC: 32.7 g/dL (ref 30.0–36.0)
MCV: 81.8 fL (ref 78.0–100.0)
Platelets: 361 10*3/uL (ref 150–400)
RBC: 4.67 MIL/uL (ref 4.22–5.81)
RDW: 12.6 % (ref 11.5–15.5)
WBC: 5.4 10*3/uL (ref 4.0–10.5)

## 2016-07-11 MED ORDER — INSULIN ASPART 100 UNIT/ML ~~LOC~~ SOLN
8.0000 [IU] | Freq: Once | SUBCUTANEOUS | Status: AC
  Administered 2016-07-11: 8 [IU] via INTRAVENOUS

## 2016-07-11 MED ORDER — SODIUM CHLORIDE 0.9 % IV BOLUS (SEPSIS)
1000.0000 mL | Freq: Once | INTRAVENOUS | Status: AC
  Administered 2016-07-11: 1000 mL via INTRAVENOUS

## 2016-07-11 MED ORDER — SODIUM CHLORIDE 0.9 % IV BOLUS (SEPSIS)
1000.0000 mL | Freq: Once | INTRAVENOUS | Status: AC
Start: 2016-07-11 — End: 2016-07-11
  Administered 2016-07-11: 1000 mL via INTRAVENOUS

## 2016-07-11 NOTE — ED Notes (Signed)
CRITICAL VALUE ALERT  Critical value received:  Glucose 577 MD notified (1st page):  Bernarda Caffey, [place]

## 2016-07-11 NOTE — Telephone Encounter (Signed)
Tried calling patient today, had to leave a message

## 2016-07-11 NOTE — Discharge Instructions (Signed)
Please take your home medications.  Your sugar will not get any better if you dont take them. Follow-up with your doctor if you have trouble buying them to see if they can be changed to cheaper options. Return here for new concerns.

## 2016-07-11 NOTE — Telephone Encounter (Signed)
Sending to dr Maudie Mercury also

## 2016-07-11 NOTE — Telephone Encounter (Signed)
Called [facility] to verify cost of [name] medications Noted that he told the Emergency room doctor they are $700.00.  Was concerned that a name brand insulin was prescribed. They said they are dispensing ReliOn generic brand insulin. Here are the current costs of his medications.   Pravastatin- $15.17 for 30 day supply Gabapentin- $ 32.46 for 30 day lisionopril-4$ 3 vials of ReliOn insulin- 50 day supply, $74.64  He may be abel to utilize [facility] pharmacy to lower his medication costs. Discussed his care with pharmacy

## 2016-07-11 NOTE — Telephone Encounter (Signed)
I tried calling his contact person, but her mailbox was full so I was unable to leave a message.

## 2016-07-11 NOTE — Telephone Encounter (Signed)
Left message for patient to call clinic about insulin. Apparently was seen in ED yesterday for hyperglycemia as he could not afford his insulin. Need to figure out what the issue is at the pharmacy as he has not had difficulty getting his Novolin 70/30 before. We can give him some insulin samples in the meantime.

## 2016-07-11 NOTE — ED Provider Notes (Signed)
[facility] DEPT Provider Note   CSN: [id number] Arrival date & time: 07/10/16  2343     History   Chief Complaint Chief Complaint  Patient presents with  . Hyperglycemia    HPI [name] is a 46 y.o. male.  The history is provided by the patient and medical records.  Hyperglycemia  Associated symptoms: increased thirst and polyuria     46 year old male with history of diabetes, hypertension, hyperlipidemia, presenting to the ED with hyperglycemia. Patient reports he has not had his insulin in about 3-1/2 weeks. He states his medications are ready for him at the pharmacy, however he is unable to afford them.  He states his insulin alone was over $500.  He states he has been having frequent thirst and urination at home.  No fever, chills, or recent illness.  No nausea/vomting.  States he has required hospitalization for his diabetes in the past.  Past Medical History:  Diagnosis Date  . Diabetes mellitus type II, uncontrolled ([place]) 07/17/2006  . ESSENTIAL HYPERTENSION 07/17/2006  . HYPERLIPIDEMIA 11/24/2008  . Pneumonia 10/22/2011    Patient Active Problem List   Diagnosis Date Noted  . Diabetic peripheral neuropathy ([place]) 07/02/2016  . Healthcare maintenance 07/02/2016  . New left bundle branch block (LBBB)   . 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 Hyperlipidemia LDL goal <70 11/24/2008  . Diabetes mellitus type 2, uncontrolled ([place]) 07/17/2006  . Essential hypertension 07/17/2006    Past Surgical History:  Procedure Laterality Date  . ARM SURGERY         Home Medications    Prior to Admission medications   Medication Sig Start Date End Date Taking? Authorizing Provider  insulin NPH-regular Human (NOVOLIN 70/30) (70-30) 100 UNIT/ML injection Inject 30 Units into the skin 2 (two) times daily with a meal. 07/02/16  Yes Carly J Rivet, MD  gabapentin (NEURONTIN) 100 MG capsule Take 1 capsule (100 mg total) by mouth 3 (three) times daily. 07/02/16    Carly J Rivet, MD  glucose blood test strip Use to check blood sugar 3 to 4 times daily. 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 Insulin dependent 07/10/15   Maryellen Pile, MD  Insulin Pen Needle (EASY COMFORT PEN NEEDLES) 31G X 5 MM MISC 1 Dose by Does not apply route 2 (two) times daily before a meal. Use 1 pen needle for each injection daily. 07/10/15   Maryellen Pile, MD  lisinopril (PRINIVIL,ZESTRIL) 5 MG tablet Take 1 tablet (5 mg total) by mouth daily. 07/02/16   Juliet Rude, MD  pravastatin (PRAVACHOL) 40 MG tablet Take 1 tablet (40 mg total) by mouth daily. 07/02/16   Juliet Rude, MD    Family History Family History  Problem Relation Age of Onset  . Hypertension Mother 48  . Cancer Mother     bladder cancer  . Alcohol abuse Father   . Hypertension Brother     Social History Social History  Substance Use Topics  . Smoking status: Never Smoker  . Smokeless tobacco: Never Used  . Alcohol use No     Allergies   Sulfa antibiotics; Bactrim [sulfamethoxazole-trimethoprim]; and Cephalexin   Review of Systems Review of Systems  Endocrine: Positive for polydipsia and polyuria.  All other systems reviewed and are negative.    Physical Exam Updated Vital Signs BP 136/89   Pulse 94   Temp 98 F (36.7 C) (Oral)   Resp 18   Ht 5\' 7"  (1.702 m)   Wt 64.4 kg  SpO2 100%   BMI 22.24 kg/m   Physical Exam  Constitutional: He is oriented to person, place, and time. He appears well-developed and well-nourished.  HENT:  Head: Normocephalic and atraumatic.  Mouth/Throat: Oropharynx is clear and moist.  Mildly dry mucous membranes  Eyes: Conjunctivae and EOM are normal. Pupils are equal, round, and reactive to light.  Neck: Normal range of motion.  Cardiovascular: Normal rate, regular rhythm and normal heart sounds.   Pulmonary/Chest: Effort normal and breath sounds normal. No respiratory distress. He has no wheezes.  Abdominal: Soft. Bowel sounds are normal. There is no tenderness. There is no  rebound.  Musculoskeletal: Normal range of motion.  Neurological: He is alert and oriented to person, place, and time.  Skin: Skin is warm and dry.  Psychiatric: He has a normal mood and affect.  Nursing note and vitals reviewed.    ED Treatments / Results  Labs (all labs ordered are listed, but only abnormal results are displayed) Labs Reviewed  BASIC METABOLIC PANEL - Abnormal; Notable for the following:       Result Value   Chloride 98 (*)    Glucose, Bld 577 (*)    All other components within normal limits  CBC - Abnormal; Notable for the following:    Hemoglobin 12.5 (*)    HCT 38.2 (*)    All other components within normal limits  URINALYSIS, ROUTINE W REFLEX MICROSCOPIC - Abnormal; Notable for the following:    Color, Urine COLORLESS (*)    Glucose, UA >=500 (*)    Hgb urine dipstick SMALL (*)    All other components within normal limits  CBG MONITORING, ED - Abnormal; Notable for the following:    Glucose-Capillary 516 (*)    All other components within normal limits  I-STAT VENOUS BLOOD GAS, ED - Abnormal; Notable for the following:    Bicarbonate 28.6 (*)    Acid-Base Excess 3.0 (*)    All other components within normal limits  CBG MONITORING, ED    EKG  EKG Interpretation None       Radiology No results found.  Procedures Procedures (including critical care time)  Medications Ordered in ED Medications  sodium chloride 0.9 % bolus 1,000 mL (1,000 mLs Intravenous New Bag/Given 07/11/16 0032)     Initial Impression / Assessment and Plan / ED Course  I have reviewed the triage vital signs and the nursing notes.  Pertinent labs & imaging results that were available during my care of the patient were reviewed by me and considered in my medical decision making (see chart for details).  46 year old male here with hyperglycemia. CBG on arrival 516.  States he is not had his insulin in over 3 weeks due to financial reasons. Doesn't endorse frequent  urination and excessive thirst. He is afebrile and nontoxic. His vitals are stable. Screening lab work with evidence of hyperglycemia without DKA.  Normal anion gap of 10, normal bicarb, urine without ketones.  Patient was fluid resuscitated here and given dose of insulin with improvement of his glucose to 122. States he is feeling better. Unfortunately, at this hour case manager is not available to help with medication needs. I've encouraged him to follow-up with his primary care doctor as his medications may need to be changed more affordable option for him. States he will call in the morning when clinic opens.  Discussed plan with patient, he acknowledged understanding and agreed with plan of care.  Return precautions given for new or worsening  symptoms.  Final Clinical Impressions(s) / ED Diagnoses   Final diagnoses:  Hyperglycemia    New Prescriptions Discharge Medication List as of 07/11/2016  4:19 AM       Larene Pickett, PA-C 07/11/16 [place], DO 07/11/16 9611

## 2016-07-12 NOTE — Telephone Encounter (Signed)
Tried calling patient again today, unable to reach

## 2016-07-24 ENCOUNTER — Encounter (HOSPITAL_COMMUNITY): Payer: Self-pay | Admitting: *Deleted

## 2016-07-24 ENCOUNTER — Emergency Department (HOSPITAL_COMMUNITY)
Admission: EM | Admit: 2016-07-24 | Discharge: 2016-07-24 | Disposition: A | Discharge status: Home / Self Care | Payer: Self-pay | Attending: Emergency Medicine | Admitting: Emergency Medicine

## 2016-07-24 DIAGNOSIS — H6122 Impacted cerumen, left ear: Secondary | ICD-10-CM | POA: Insufficient documentation

## 2016-07-24 DIAGNOSIS — E1140 Type 2 diabetes mellitus with diabetic neuropathy, unspecified: Secondary | ICD-10-CM | POA: Insufficient documentation

## 2016-07-24 DIAGNOSIS — I10 Essential (primary) hypertension: Secondary | ICD-10-CM | POA: Insufficient documentation

## 2016-07-24 DIAGNOSIS — Z79899 Other long term (current) drug therapy: Secondary | ICD-10-CM | POA: Insufficient documentation

## 2016-07-24 DIAGNOSIS — Z794 Long term (current) use of insulin: Secondary | ICD-10-CM | POA: Insufficient documentation

## 2016-07-24 NOTE — ED Provider Notes (Signed)
[facility] DEPT Provider Note   CSN: [id number] Arrival date & time: 07/24/16  1414  By signing my name below, I, Higinio Plan, attest that this documentation has been prepared under the direction and in the presence of Dre Gamino, [place] . Electronically Signed: Higinio Plan, Scribe. 07/24/2016. 3:18 PM.  History   Chief Complaint Chief Complaint  Patient presents with  . Ear Fullness   The history is provided by the patient. No language interpreter was used.   HPI Comments: [name] is a 46 y.o. male with PMHx of DM2 and HTN, who presents to the Emergency Department complaining of gradually worsening, constant, sensation of left ear fullness that began 5 days ago. Pt reports he has not taken any medication to relieve his symptoms. He denies any left ear pain, hearing loss, ringing in his ears, congestion, cough, fever, chills, nausea, vomiting, and diarrhea.   Past Medical History:  Diagnosis Date  . Diabetes mellitus type II, uncontrolled ([place]) 07/17/2006  . ESSENTIAL HYPERTENSION 07/17/2006  . HYPERLIPIDEMIA 11/24/2008  . Pneumonia 10/22/2011   Patient Active Problem List   Diagnosis Date Noted  . Diabetic peripheral neuropathy ([place]) 07/02/2016  . Healthcare maintenance 07/02/2016  . New left bundle branch block (LBBB)   . 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 Hyperlipidemia LDL goal <70 11/24/2008  . Diabetes mellitus type 2, uncontrolled ([place]) 07/17/2006  . Essential hypertension 07/17/2006   Past Surgical History:  Procedure Laterality Date  . ARM SURGERY      Home Medications    Prior to Admission medications   Medication Sig Start Date End Date Taking? Authorizing Provider  gabapentin (NEURONTIN) 100 MG capsule Take 1 capsule (100 mg total) by mouth 3 (three) times daily. 07/02/16   Carly J Rivet, MD  glucose blood test strip Use to check blood sugar 3 to 4 times daily. 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 Insulin dependent 07/10/15   Maryellen Pile, MD  insulin  NPH-regular Human (NOVOLIN 70/30) (70-30) 100 UNIT/ML injection Inject 30 Units into the skin 2 (two) times daily with a meal. 07/02/16   Carly J Rivet, MD  Insulin Pen Needle (EASY COMFORT PEN NEEDLES) 31G X 5 MM MISC 1 Dose by Does not apply route 2 (two) times daily before a meal. Use 1 pen needle for each injection daily. 07/10/15   Maryellen Pile, MD  lisinopril (PRINIVIL,ZESTRIL) 5 MG tablet Take 1 tablet (5 mg total) by mouth daily. 07/02/16   Juliet Rude, MD  pravastatin (PRAVACHOL) 40 MG tablet Take 1 tablet (40 mg total) by mouth daily. 07/02/16   Juliet Rude, MD    Family History Family History  Problem Relation Age of Onset  . Hypertension Mother 15  . Cancer Mother     bladder cancer  . Alcohol abuse Father   . Hypertension Brother    Social History Social History  Substance Use Topics  . Smoking status: Never Smoker  . Smokeless tobacco: Never Used  . Alcohol use No   Allergies   Sulfa antibiotics; Bactrim [sulfamethoxazole-trimethoprim]; and Cephalexin  Review of Systems Review of Systems  Constitutional: Negative for chills and fever.  HENT: Negative for congestion, ear pain and hearing loss.        +sensation of left ear fullness  Respiratory: Negative for cough.     Physical Exam Updated Vital Signs BP 113/77 (BP Location: Left Arm)   Pulse 100   Temp 98.1 F (36.7 C) (Oral)   Resp 17   Ht  5\' 7"  (1.702 m)   Wt 140 lb (63.5 kg)   SpO2 100%   BMI 21.93 kg/m   Physical Exam  Constitutional: He is oriented to person, place, and time. He appears well-developed and well-nourished.  Well appearing  HENT:  Head: Normocephalic and atraumatic.  Nose: Nose normal.  Mouth/Throat: Oropharynx is clear and moist.  No tenderness with manipulation of the ears. No tenderness of the mastoid process, no redness or swelling. Right ear, EAC, and TM intact without any abnormality. Left ear with obvious complete compaction and obstruction.  There was significant ear  wax of a length of 2 cm extracted.  Left ear, EAC, and TM visualized after cerumen extraction and appeared normal with no erythema, swelling, or TM bulging or perforation.  No infectious processes visualized. Mastoid process without erythema, swelling, tenderness bilaterally. No tenderness to manipulation of ears.  Eyes: EOM are normal.  Neck: Normal range of motion.  Pulmonary/Chest: Effort normal. No respiratory distress.  Abdominal: He exhibits no distension.  Musculoskeletal: Normal range of motion.  Neurological: He is alert and oriented to person, place, and time.  Psychiatric: He has a normal mood and affect.  Nursing note and vitals reviewed.  ED Treatments / Results  DIAGNOSTIC STUDIES:  Oxygen Saturation is 100% on RA, normal by my interpretation.    COORDINATION OF CARE:  3:18 PM Discussed treatment plan with pt at bedside and pt agreed to plan.  Labs (all labs ordered are listed, but only abnormal results are displayed) Labs Reviewed - No data to display  EKG  EKG Interpretation None       Radiology No results found.  Procedures Procedures (including critical care time)  Medications Ordered in ED Medications - No data to display  Initial Impression / Assessment and Plan / ED Course  I have reviewed the triage vital signs and the nursing notes.  Pertinent labs & imaging results that were available during my care of the patient were reviewed by me and considered in my medical decision making (see chart for details).    Patient here with cerumen impaction. Left ear with cerumen of 2 cm length extracted with curette. Left ear and pain membranes visualized after and otherwise normal.  Right ear is otherwise normal patient felt instant relief after extraction. Patient recommended to follow up with primary care provider as needed. Reasons to immediately return to ED discussed.  I personally performed the services described in this documentation, which was scribed  in my presence. The recorded information has been reviewed and is accurate.  Final Clinical Impressions(s) / ED Diagnoses   Final diagnoses:  Impacted cerumen of left ear    New Prescriptions New Prescriptions   No medications on file     [place], [place] 07/24/16 1538    Leo Grosser, MD 07/24/16 [phone]

## 2016-07-24 NOTE — Discharge Instructions (Signed)
Please follow up with her primary care provider in one week as needed. Remember to not use cotton swabs in the ear.  SEEK MEDICAL CARE IF: You have ear pain. Your condition does not improve with treatment. You have hearing loss. You have blood, pus, or other fluid coming from your ear.

## 2016-07-24 NOTE — ED Triage Notes (Signed)
Pt reports left ear being clogged x 3 days.

## 2016-08-19 ENCOUNTER — Telehealth: Payer: Self-pay | Admitting: Internal Medicine

## 2016-08-19 NOTE — Telephone Encounter (Signed)
APT. REMINDER CALL, LMTCB °

## 2016-08-20 ENCOUNTER — Encounter (INDEPENDENT_AMBULATORY_CARE_PROVIDER_SITE_OTHER): Payer: Self-pay

## 2016-08-20 ENCOUNTER — Encounter: Payer: Self-pay | Admitting: Internal Medicine

## 2016-08-20 ENCOUNTER — Ambulatory Visit (INDEPENDENT_AMBULATORY_CARE_PROVIDER_SITE_OTHER): Payer: Self-pay | Admitting: Internal Medicine

## 2016-08-20 VITALS — BP 128/77 | HR 94 | Temp 98.4°F | Ht 67.0 in | Wt 145.2 lb

## 2016-08-20 DIAGNOSIS — Z7984 Long term (current) use of oral hypoglycemic drugs: Secondary | ICD-10-CM

## 2016-08-20 DIAGNOSIS — IMO0002 Reserved for concepts with insufficient information to code with codable children: Secondary | ICD-10-CM

## 2016-08-20 DIAGNOSIS — E1142 Type 2 diabetes mellitus with diabetic polyneuropathy: Secondary | ICD-10-CM

## 2016-08-20 DIAGNOSIS — E1165 Type 2 diabetes mellitus with hyperglycemia: Secondary | ICD-10-CM

## 2016-08-20 DIAGNOSIS — E785 Hyperlipidemia, unspecified: Secondary | ICD-10-CM

## 2016-08-20 DIAGNOSIS — Z598 Other problems related to housing and economic circumstances: Secondary | ICD-10-CM

## 2016-08-20 LAB — GLUCOSE, CAPILLARY: Glucose-Capillary: 198 mg/dL — ABNORMAL HIGH (ref 65–99)

## 2016-08-20 MED ORDER — "INSULIN SYRINGE-NEEDLE U-100 31G X 15/64"" 0.5 ML MISC": 12 refills | Status: DC

## 2016-08-20 NOTE — Progress Notes (Signed)
Case discussed with Dr. Arcelia Jew soon after the resident saw the patient. We reviewed the resident's history and exam and pertinent patient test results. I agree with the assessment, diagnosis, and plan of care documented in the resident's note.

## 2016-08-20 NOTE — Progress Notes (Signed)
   CC: Diabetes follow up  HPI:  [name] is a 46 y.o. man with PMHx as noted below who presents today for follow up of his diabetes.  Uncontrolled Type 2 DM: He has a longstanding hx of uncontrolled diabetes in large part due to his lack of health insurance and job that requires him to travel frequently. His last A1c was >14. He is taking Novolin 70/30 30 units twice daily with meals. He did not bring his glucometer today. He reports not being to test his blood sugar as his lancet device is broken. Denies any hypoglycemic symptoms. He has been taking Lisinopril for his proteinuria. He denies any sores on his feet, blurry vision, or polydipsia/polyuria.   Peripheral Neuropathy: Secondary to diabetes. Reports his tingling in his hands and feet has improved with Gabapentin 100 mg TID.   Hyperlipidemia: Reports taking Pravastatin 40 mg daily. Denies any muscle cramps.   Past Medical History:  Diagnosis Date  . Diabetes mellitus type II, uncontrolled ([place]) 07/17/2006  . ESSENTIAL HYPERTENSION 07/17/2006  . HYPERLIPIDEMIA 11/24/2008  . Pneumonia 10/22/2011    Review of Systems:   Review of Systems  Constitutional: Negative for chills, fever, malaise/fatigue and weight loss.  HENT: Negative for congestion, ear pain, sinus pain and sore throat.   Eyes: Negative for blurred vision and double vision.  Respiratory: Negative for cough, sputum production and shortness of breath.   Cardiovascular: Negative for chest pain, palpitations, orthopnea and leg swelling.  Gastrointestinal: Negative for abdominal pain, blood in stool, constipation, diarrhea, melena, nausea and vomiting.  Genitourinary: Negative for dysuria, frequency, hematuria and urgency.  Musculoskeletal: Negative for joint pain and myalgias.  Neurological: Negative for dizziness, speech change, focal weakness, weakness and headaches.  Psychiatric/Behavioral: Negative for depression. The patient is not nervous/anxious.      Physical Exam:  Vitals:   08/20/16 1403  BP: 128/77  Pulse: 94  Temp: 98.4 F (36.9 C)  TempSrc: Oral  SpO2: 97%  Weight: 145 lb 3.2 oz (65.9 kg)  Height: 5\' 7"  (1.702 m)   General: Well-nourished man sitting up, NAD HEENT: [place]/AT, EOMI, sclera anicteric, mucus membranes moist  CV: RRR, no m/g/r Pulm: CTA bilaterally, breaths non-labored Ext: warm, no peripheral edema, distal pulses 2+ Neuro: alert and oriented x 3 Skin: No sores present on feet.  Assessment & Plan:   See Encounters Tab for problem based charting.  Patient discussed with Dr. Eppie Gibson

## 2016-08-20 NOTE — Assessment & Plan Note (Addendum)
Unclear how his diabetes control is doing as he did not bring his glucometer. He has a longstanding hx of uncontrolled diabetes with last A1c >14. Gave him a new lancing device today so that he is able to check his blood sugars 3-4 times daily. We discussed the importance of checking his blood sugar. Will continue him on Novolin 70/30 30 units BID and follow up his blood sugar control at his next visit. Advised to continue Lisinopril for his proteinuria. Prescribed him syringes today as the vials of 70/30 were cheaper than the pens. Recommended that he bring in his orange card paperwork so that he can be referred to Ophthalmology. His peripheral neuropathy seems to be well controlled with Gabapentin 100 mg TID so will continue this. Will have him follow up in 2 months to reassess his diabetes control. Foot exam performed today.

## 2016-08-20 NOTE — Assessment & Plan Note (Signed)
Continue Pravastatin 40 mg daily. He should be on a moderate intensity statin with his diabetes but he is unable to afford statins that are not on the $4 list.

## 2016-08-20 NOTE — Patient Instructions (Signed)
General Instructions: - Refill sent for insulin syringes - Please check blood glucose 3-4 times daily - bring glucometer to next visit - Follow up in June  - Work on either orange card paperwork or health insurance through your new job so you can get an eye exam  Please bring your medicines with you each time you come to clinic.  Medicines may include prescription medications, over-the-counter medications, herbal remedies, eye drops, vitamins, or other pills.   Progress Toward Treatment Goals:  Treatment Goal 09/06/2014  Hemoglobin A1C deteriorated  Blood pressure at goal    Self Care Goals & Plans:  Self Care Goal 08/20/2016  Manage my medications take my medicines as prescribed; bring my medications to every visit; refill my medications on time  Monitor my health keep track of my blood pressure; bring my glucose meter and log to each visit; keep track of my blood glucose; check my feet daily  Eat healthy foods eat more vegetables; eat foods that are low in salt; eat baked foods instead of fried foods  Be physically active find an activity I enjoy    Home Blood Glucose Monitoring 09/06/2014  Check my blood sugar 2 times a day  When to check my blood sugar before breakfast; before dinner     Care Management & Community Referrals:  Referral 09/06/2014  Referrals made for care management support diabetes educator; nutritionist

## 2016-10-29 ENCOUNTER — Encounter: Payer: Self-pay | Admitting: *Deleted

## 2016-10-29 ENCOUNTER — Encounter: Payer: Self-pay | Admitting: Internal Medicine

## 2016-11-11 ENCOUNTER — Encounter: Payer: Self-pay | Admitting: Internal Medicine

## 2016-11-29 ENCOUNTER — Encounter: Payer: Self-pay | Admitting: Internal Medicine

## 2016-11-29 NOTE — Assessment & Plan Note (Deleted)
His DM2 has been difficult to control with last A1c >14. Most recently, his insulin regimen was unable to adjusted due to a malfunctioning lancet device and no glucometer information. Since that time,

## 2016-11-29 NOTE — Progress Notes (Deleted)
   CC: DM management, routine follow up  HPI:  Mr.Levester R Bertram is a 46 y.o. M with past medical history as detailed below who presents to the clinic today for routine follow up of his chronic medical conditions.   Diabetes: Glycemic control for Mr. Schweppe has been difficult, namely due to financial issues. His current regimen is Novolin 70/30 30 U BID. At his last visit, he was given a new lancet device in order to better check his blood sugar and to allow for any necessary insulin dose adjustments. Today, *** .   He is currently on Lisinopril, ***.    Past Medical History:  Diagnosis Date  . Diabetes mellitus type II, uncontrolled ([place]) 07/17/2006  . ESSENTIAL HYPERTENSION 07/17/2006  . HYPERLIPIDEMIA 11/24/2008  . Pneumonia 10/22/2011   Review of Systems:  ROS   Physical Exam:  There were no vitals filed for this visit. Physical Exam  Assessment & Plan:   See Encounters Tab for problem based charting.  Patient {GC/GE:[id number]::"discussed with","seen with"} Dr. {NAMES:[id number]::"Butcher","Granfortuna","E. Hoffman","Klima","Mullen","Narendra","Raines","Vincent"}

## 2016-12-30 ENCOUNTER — Encounter: Payer: Self-pay | Admitting: Internal Medicine

## 2017-02-08 ENCOUNTER — Encounter (HOSPITAL_COMMUNITY): Payer: Self-pay | Admitting: Emergency Medicine

## 2017-02-08 ENCOUNTER — Ambulatory Visit (HOSPITAL_COMMUNITY)
Admission: EM | Admit: 2017-02-08 | Discharge: 2017-02-08 | Disposition: A | Discharge status: Home / Self Care | Payer: Self-pay | Attending: Urgent Care | Admitting: Urgent Care

## 2017-02-08 DIAGNOSIS — S90121A Contusion of right lesser toe(s) without damage to nail, initial encounter: Secondary | ICD-10-CM

## 2017-02-08 DIAGNOSIS — E1165 Type 2 diabetes mellitus with hyperglycemia: Secondary | ICD-10-CM

## 2017-02-08 DIAGNOSIS — S99921A Unspecified injury of right foot, initial encounter: Secondary | ICD-10-CM

## 2017-02-08 DIAGNOSIS — IMO0001 Reserved for inherently not codable concepts without codable children: Secondary | ICD-10-CM

## 2017-02-08 DIAGNOSIS — Z794 Long term (current) use of insulin: Secondary | ICD-10-CM

## 2017-02-08 NOTE — ED Provider Notes (Signed)
    MRN: [id number] DOB: 02-24-1971  Subjective:   [name] is a 46 y.o. male presenting for chief complaint of Wound Check  Reports 5 week history of injury to his right 2nd toe. He felt pain that day but has had steady improvement of his toe since then. Today, he would like to make sure he does not have gangrene. Denies fever, drainage of pus or bleeding, redness, swelling, red streaks. Has uncontrolled diabetes, fasting blood sugar checks are greater than 200. He checks his feet daily.  No current facility-administered medications for this encounter.   Current Outpatient Prescriptions:  .  glucose blood test strip, Use to check blood sugar 3 to 4 times daily. 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 Insulin dependent, Disp: 100 each, Rfl: 12 .  insulin NPH-regular Human (NOVOLIN 70/30) (70-30) 100 UNIT/ML injection, Inject 30 Units into the skin 2 (two) times daily with a meal., Disp: 30 mL, Rfl: 5 .  Insulin Syringe-Needle U-100 31G X 15/64" 0.5 ML MISC, The patient is insulin requiring, ICD 10 code E11.9. The patient tests 4 times per day., Disp: 100 each, Rfl: 12   [name] is allergic to sulfa antibiotics; bactrim [sulfamethoxazole-trimethoprim]; and cephalexin.  [name]  has a past medical history of Diabetes mellitus type II, uncontrolled ([place]) (07/17/2006); ESSENTIAL HYPERTENSION (07/17/2006); HYPERLIPIDEMIA (11/24/2008); and Pneumonia (10/22/2011). Also  has a past surgical history that includes ARM SURGERY.  Objective:   Vitals: BP (!) 140/93 (BP Location: Right Arm)   Pulse 97   Temp 98 F (36.7 C) (Oral)   Resp 18   SpO2 99%   Physical Exam  Constitutional: He is oriented to person, place, and time. He appears well-developed and well-nourished.  Cardiovascular: Normal rate.   Pulmonary/Chest: Effort normal.  Musculoskeletal:       Right foot: There is normal capillary refill.       Feet:  Dorsalis Pedis is 2+ for right foot.  Neurological: He is alert and oriented to person, place,  and time.   Assessment and Plan :   Hematoma of toe of right foot, initial encounter  Toe injury, right, initial encounter  Uncontrolled type 2 diabetes mellitus without complication, with long-term current use of insulin ([place])  Patient has a resolving hematoma. Anticipatory guidance provided. Return-to-clinic precautions discussed, patient verbalized understanding.   Jaynee Eagles, PA-C [facility]  02/08/2017  3:50 PM    Jaynee Eagles, PA-C 02/08/17 1610

## 2017-02-08 NOTE — ED Triage Notes (Signed)
The patient presented to the [facility] with a complaint of a wound to the second toe on his right foot x 5 weeks.

## 2017-02-08 NOTE — Discharge Instructions (Signed)
Please check your feet daily. If you develop pain, redness, swelling, drainage of pus, red streaks along your foot, come back for a recheck as these are signs of infection.   We have several providers at [facility] that can start to help you with your chronic medical conditions. My name is Jaynee Eagles, [place], if you would like to work with me but I encourage you to pick one person to follow up with your primary care. The phone number is [phone].

## 2017-02-16 ENCOUNTER — Encounter (HOSPITAL_COMMUNITY): Payer: Self-pay | Admitting: Emergency Medicine

## 2017-02-16 DIAGNOSIS — Z794 Long term (current) use of insulin: Secondary | ICD-10-CM | POA: Insufficient documentation

## 2017-02-16 DIAGNOSIS — R350 Frequency of micturition: Secondary | ICD-10-CM | POA: Insufficient documentation

## 2017-02-16 DIAGNOSIS — M62838 Other muscle spasm: Secondary | ICD-10-CM | POA: Insufficient documentation

## 2017-02-16 DIAGNOSIS — I10 Essential (primary) hypertension: Secondary | ICD-10-CM | POA: Insufficient documentation

## 2017-02-16 DIAGNOSIS — E119 Type 2 diabetes mellitus without complications: Secondary | ICD-10-CM | POA: Insufficient documentation

## 2017-02-16 DIAGNOSIS — M79605 Pain in left leg: Secondary | ICD-10-CM | POA: Insufficient documentation

## 2017-02-16 NOTE — ED Triage Notes (Signed)
Pt BIB EMS from home. EMS states pt called due to bilateral inner thigh cramping that started around 10pm.  Pt states hes only drank 1 glass of water since yesterday. EMS VS: 140/90, 96 HR, 16 RR, 99% room air, 100 cbg.

## 2017-02-17 ENCOUNTER — Emergency Department (HOSPITAL_COMMUNITY)
Admission: EM | Admit: 2017-02-17 | Discharge: 2017-02-17 | Disposition: A | Discharge status: Home / Self Care | Payer: Self-pay | Attending: Emergency Medicine | Admitting: Emergency Medicine

## 2017-02-17 DIAGNOSIS — M62838 Other muscle spasm: Secondary | ICD-10-CM

## 2017-02-17 LAB — BASIC METABOLIC PANEL
Anion gap: 8 (ref 5–15)
BUN: 27 mg/dL — ABNORMAL HIGH (ref 6–20)
CO2: 25 mmol/L (ref 22–32)
Calcium: 8.7 mg/dL — ABNORMAL LOW (ref 8.9–10.3)
Chloride: 104 mmol/L (ref 101–111)
Creatinine, Ser: 1.03 mg/dL (ref 0.61–1.24)
GFR calc Af Amer: >60 mL/min (ref >60)
GFR calc non Af Amer: >60 mL/min (ref >60)
Glucose, Bld: 173 mg/dL — ABNORMAL HIGH (ref 65–99)
Potassium: 4 mmol/L (ref 3.5–5.1)
Sodium: 137 mmol/L (ref 135–145)

## 2017-02-17 LAB — URINALYSIS, ROUTINE W REFLEX MICROSCOPIC
Bilirubin Urine: NEGATIVE
Glucose, UA: >=500 mg/dL — AB
Hgb urine dipstick: NEGATIVE
Ketones, ur: NEGATIVE mg/dL
Nitrite: NEGATIVE
Protein, ur: 30 mg/dL — AB
Specific Gravity, Urine: 1.027 (ref 1.005–1.030)
Squamous Epithelial / LPF: NONE SEEN
pH: 6 (ref 5.0–8.0)

## 2017-02-17 LAB — CBC WITH DIFFERENTIAL/PLATELET
Basophils Absolute: 0 10*3/uL (ref 0.0–0.1)
Basophils Relative: 0 %
Eosinophils Absolute: 0.2 10*3/uL (ref 0.0–0.7)
Eosinophils Relative: 2 %
HCT: 36 % — ABNORMAL LOW (ref 39.0–52.0)
Hemoglobin: 12.1 g/dL — ABNORMAL LOW (ref 13.0–17.0)
Lymphocytes Relative: 27 %
Lymphs Abs: 2.1 10*3/uL (ref 0.7–4.0)
MCH: 27.8 pg (ref 26.0–34.0)
MCHC: 33.6 g/dL (ref 30.0–36.0)
MCV: 82.8 fL (ref 78.0–100.0)
Monocytes Absolute: 0.5 10*3/uL (ref 0.1–1.0)
Monocytes Relative: 6 %
Neutro Abs: 5 10*3/uL (ref 1.7–7.7)
Neutrophils Relative %: 65 %
Platelets: 251 10*3/uL (ref 150–400)
RBC: 4.35 MIL/uL (ref 4.22–5.81)
RDW: 12.8 % (ref 11.5–15.5)
WBC: 7.8 10*3/uL (ref 4.0–10.5)

## 2017-02-17 LAB — MAGNESIUM: Magnesium: 1.7 mg/dL (ref 1.7–2.4)

## 2017-02-17 MED ORDER — CYCLOBENZAPRINE HCL 10 MG PO TABS: 10.0000 mg | ORAL_TABLET | Freq: Two times a day (BID) | ORAL | 0 refills | Status: DC | PRN

## 2017-02-17 NOTE — ED Notes (Signed)
Called x 2. No answer 

## 2017-02-17 NOTE — ED Notes (Signed)
Called x 1 from lobby. No answer.

## 2017-02-17 NOTE — ED Provider Notes (Signed)
[place] DEPT Provider Note   CSN: [id number] Arrival date & time: 02/16/17  2241     History   Chief Complaint Chief Complaint  Patient presents with  . Leg Pain    HPI [name] is a 46 y.o. male.  HPI Patient presents with bilateral medial thigh muscle spasms waking him from sleep at 10:30pm. Symptoms have since resolved. He states he has a history of diabetes and has been urinating frequently. States he's only had one glass of water over the past 2 days. His blood sugar has been running in the 200s. States he's been compliant with his medication. Past Medical History:  Diagnosis Date  . Diabetes mellitus type II, uncontrolled ([place]) 07/17/2006  . ESSENTIAL HYPERTENSION 07/17/2006  . HYPERLIPIDEMIA 11/24/2008  . Pneumonia 10/22/2011    Patient Active Problem List   Diagnosis Date Noted  . Healthcare maintenance 07/02/2016  . New left bundle branch block (LBBB)   . 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 Hyperlipidemia LDL goal <70 11/24/2008  . Uncontrolled type 2 diabetes mellitus with peripheral neuropathy ([place]) 07/17/2006    Past Surgical History:  Procedure Laterality Date  . ARM SURGERY         Home Medications    Prior to Admission medications   Medication Sig Start Date End Date Taking? Authorizing Provider  insulin NPH-regular Human (NOVOLIN 70/30) (70-30) 100 UNIT/ML injection Inject 30 Units into the skin 2 (two) times daily with a meal. 07/02/16  Yes Rivet, Sindy Guadeloupe, MD  cyclobenzaprine (FLEXERIL) 10 MG tablet Take 1 tablet (10 mg total) by mouth 2 (two) times daily as needed for muscle spasms. 02/17/17   Julianne Rice, MD  glucose blood test strip Use to check blood sugar 3 to 4 times daily. 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 Insulin dependent 07/10/15   Maryellen Pile, MD  Insulin Syringe-Needle U-100 31G X 15/64" 0.5 ML MISC The patient is insulin requiring, ICD 10 code E11.9. The patient tests 4 times per day. 08/20/16   Rivet, Sindy Guadeloupe, MD     Family History Family History  Problem Relation Age of Onset  . Hypertension Mother 46  . Cancer Mother        bladder cancer  . Alcohol abuse Father   . Hypertension Brother     Social History Social History  Substance Use Topics  . Smoking status: Never Smoker  . Smokeless tobacco: Never Used  . Alcohol use No     Allergies   Sulfa antibiotics; Bactrim [sulfamethoxazole-trimethoprim]; and Cephalexin   Review of Systems Review of Systems  Constitutional: Negative for chills and fever.  Respiratory: Negative for shortness of breath.   Cardiovascular: Negative for chest pain.  Gastrointestinal: Negative for abdominal pain, diarrhea, nausea and vomiting.  Genitourinary: Positive for frequency. Negative for dysuria, flank pain and hematuria.  Musculoskeletal: Positive for myalgias. Negative for arthralgias and back pain.  Neurological: Negative for syncope, weakness, numbness and headaches.  All other systems reviewed and are negative.    Physical Exam Updated Vital Signs BP 111/70   Pulse 87   Temp 98.8 F (37.1 C) (Oral)   Resp 18   SpO2 99%   Physical Exam  Constitutional: He is oriented to person, place, and time. He appears well-developed and well-nourished.  HENT:  Head: Normocephalic and atraumatic.  Mouth/Throat: Oropharynx is clear and moist.  Eyes: Pupils are equal, round, and reactive to light. EOM are normal.  Neck: Normal range of motion. Neck supple.  Cardiovascular: Normal rate  and regular rhythm.   Pulmonary/Chest: Effort normal and breath sounds normal.  Abdominal: Soft. Bowel sounds are normal. There is no tenderness. There is no rebound and no guarding.  Musculoskeletal: Normal range of motion. He exhibits no edema or tenderness.  No lower extremity tenderness to palpation, asymmetry or swelling. Distal pulses are 2+. No midline thoracic or lumbar tenderness.  Neurological: He is alert and oriented to person, place, and time.  Skin: Skin  is warm and dry. No rash noted. No erythema.  Psychiatric: He has a normal mood and affect. His behavior is normal.  Nursing note and vitals reviewed.    ED Treatments / Results  Labs (all labs ordered are listed, but only abnormal results are displayed) Labs Reviewed  CBC WITH DIFFERENTIAL/PLATELET - Abnormal; Notable for the following:       Result Value   Hemoglobin 12.1 (*)    HCT 36.0 (*)    All other components within normal limits  BASIC METABOLIC PANEL - Abnormal; Notable for the following:    Glucose, Bld 173 (*)    BUN 27 (*)    Calcium 8.7 (*)    All other components within normal limits  URINALYSIS, ROUTINE W REFLEX MICROSCOPIC - Abnormal; Notable for the following:    Glucose, UA >=500 (*)    Protein, ur 30 (*)    Leukocytes, UA TRACE (*)    Bacteria, UA RARE (*)    All other components within normal limits  MAGNESIUM  GC/CHLAMYDIA PROBE AMP ([place]) NOT AT [facility]    EKG  EKG Interpretation None       Radiology No results found.  Procedures Procedures (including critical care time)  Medications Ordered in ED Medications - No data to display   Initial Impression / Assessment and Plan / ED Course  I have reviewed the triage vital signs and the nursing notes.  Pertinent labs & imaging results that were available during my care of the patient were reviewed by me and considered in my medical decision making (see chart for details).     Patient currently is asymptomatic. Advised to drink plenty of water. Return precautions given.  Final Clinical Impressions(s) / ED Diagnoses   Final diagnoses:  Muscle spasms of both lower extremities    New Prescriptions New Prescriptions   CYCLOBENZAPRINE (FLEXERIL) 10 MG TABLET    Take 1 tablet (10 mg total) by mouth 2 (two) times daily as needed for muscle spasms.     Julianne Rice, MD 02/17/17 [phone]

## 2017-02-17 NOTE — ED Notes (Signed)
Pt ambulated to restroom for urine sample.

## 2017-02-17 NOTE — Discharge Instructions (Signed)
Make sure you're drinking plenty of water. Follow-up with health and wellness clinic to manage your diabetes.

## 2017-02-17 NOTE — ED Notes (Signed)
Bed: WTR8 Expected date:  Expected time:  Means of arrival:  Comments: 

## 2017-02-17 NOTE — ED Notes (Signed)
Bed: WTR7 Expected date:  Expected time:  Means of arrival:  Comments: 

## 2017-02-18 LAB — GC/CHLAMYDIA PROBE AMP (~~LOC~~) NOT AT ARMC
Chlamydia: NEGATIVE
Neisseria Gonorrhea: NEGATIVE

## 2017-03-10 ENCOUNTER — Ambulatory Visit: Payer: Self-pay | Admitting: Family Medicine

## 2017-05-05 ENCOUNTER — Ambulatory Visit: Payer: Self-pay | Admitting: Family Medicine

## 2017-05-25 ENCOUNTER — Other Ambulatory Visit: Payer: Self-pay

## 2017-05-25 ENCOUNTER — Encounter (HOSPITAL_COMMUNITY): Payer: Self-pay | Admitting: *Deleted

## 2017-05-25 ENCOUNTER — Emergency Department (HOSPITAL_COMMUNITY)
Admission: EM | Admit: 2017-05-25 | Discharge: 2017-05-25 | Disposition: A | Discharge status: Home / Self Care | Payer: Self-pay | Attending: Emergency Medicine | Admitting: Emergency Medicine

## 2017-05-25 DIAGNOSIS — I10 Essential (primary) hypertension: Secondary | ICD-10-CM | POA: Insufficient documentation

## 2017-05-25 DIAGNOSIS — Z882 Allergy status to sulfonamides status: Secondary | ICD-10-CM | POA: Insufficient documentation

## 2017-05-25 DIAGNOSIS — E1140 Type 2 diabetes mellitus with diabetic neuropathy, unspecified: Secondary | ICD-10-CM | POA: Insufficient documentation

## 2017-05-25 DIAGNOSIS — R739 Hyperglycemia, unspecified: Secondary | ICD-10-CM

## 2017-05-25 DIAGNOSIS — Z794 Long term (current) use of insulin: Secondary | ICD-10-CM | POA: Insufficient documentation

## 2017-05-25 DIAGNOSIS — E1165 Type 2 diabetes mellitus with hyperglycemia: Secondary | ICD-10-CM | POA: Insufficient documentation

## 2017-05-25 DIAGNOSIS — R350 Frequency of micturition: Secondary | ICD-10-CM | POA: Insufficient documentation

## 2017-05-25 DIAGNOSIS — Z79899 Other long term (current) drug therapy: Secondary | ICD-10-CM | POA: Insufficient documentation

## 2017-05-25 LAB — URINALYSIS, ROUTINE W REFLEX MICROSCOPIC
Bilirubin Urine: NEGATIVE
Glucose, UA: >=500 mg/dL — AB
Ketones, ur: NEGATIVE mg/dL
Leukocytes, UA: NEGATIVE
Nitrite: NEGATIVE
Protein, ur: 100 mg/dL — AB
Specific Gravity, Urine: 1.028 (ref 1.005–1.030)
Squamous Epithelial / LPF: NONE SEEN
pH: 5 (ref 5.0–8.0)

## 2017-05-25 LAB — CBC
HCT: 42.5 % (ref 39.0–52.0)
Hemoglobin: 14 g/dL (ref 13.0–17.0)
MCH: 27.7 pg (ref 26.0–34.0)
MCHC: 32.9 g/dL (ref 30.0–36.0)
MCV: 84 fL (ref 78.0–100.0)
Platelets: 317 10*3/uL (ref 150–400)
RBC: 5.06 MIL/uL (ref 4.22–5.81)
RDW: 12.4 % (ref 11.5–15.5)
WBC: 4.7 10*3/uL (ref 4.0–10.5)

## 2017-05-25 LAB — BASIC METABOLIC PANEL
Anion gap: 10 (ref 5–15)
BUN: 12 mg/dL (ref 6–20)
CO2: 27 mmol/L (ref 22–32)
Calcium: 9.1 mg/dL (ref 8.9–10.3)
Chloride: 100 mmol/L — ABNORMAL LOW (ref 101–111)
Creatinine, Ser: 0.95 mg/dL (ref 0.61–1.24)
GFR calc Af Amer: >60 mL/min (ref >60)
GFR calc non Af Amer: >60 mL/min (ref >60)
Glucose, Bld: 234 mg/dL — ABNORMAL HIGH (ref 65–99)
Potassium: 3.8 mmol/L (ref 3.5–5.1)
Sodium: 137 mmol/L (ref 135–145)

## 2017-05-25 LAB — CBG MONITORING, ED
Glucose-Capillary: 273 mg/dL — ABNORMAL HIGH (ref 65–99)
Glucose-Capillary: 219 mg/dL — ABNORMAL HIGH (ref 65–99)

## 2017-05-25 MED ORDER — INSULIN NPH ISOPHANE & REGULAR (70-30) 100 UNIT/ML ~~LOC~~ SUSP: 30.0000 [IU] | Freq: Two times a day (BID) | SUBCUTANEOUS | 1 refills | Status: DC

## 2017-05-25 NOTE — Discharge Instructions (Signed)
Please take your insulin, as prescribed.  Follow-up with the primary care office, as planned.  Return to the ED as needed.

## 2017-05-25 NOTE — ED Triage Notes (Signed)
Pt states he is an insulin dependant diabetic who ran out of insulin 1 month ago, but is switching MD's so did not get a refill on his insulin.  He thought his sugars might be high because he was urinating frequently.

## 2017-05-25 NOTE — ED Provider Notes (Signed)
[place] EMERGENCY DEPARTMENT Provider Note   CSN: [id number] Arrival date & time: 05/25/17  1552     History   Chief Complaint Chief Complaint  Patient presents with  . Hyperglycemia  . Urinary Frequency    HPI [name] is a 47 y.o. male.  HPI   [name] is a 47 y.o. male, with a history of DM and HTN, presenting to the ED with hyperglycemia and polyuria for the last month.  He has been staying well-hydrated by consistently drinking water. States his vial of insulin broke and he has not been to his PCP because he switched PCPs recently. States he has an appointment with [facility] on January 22. Denies vomiting, recent illness, fever/chills, abdominal pain, dizziness, or any other complaints.    Past Medical History:  Diagnosis Date  . Diabetes mellitus type II, uncontrolled ([place]) 07/17/2006  . ESSENTIAL HYPERTENSION 07/17/2006  . HYPERLIPIDEMIA 11/24/2008  . Pneumonia 10/22/2011    Patient Active Problem List   Diagnosis Date Noted  . Healthcare maintenance 07/02/2016  . New left bundle branch block (LBBB)   . 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 Hyperlipidemia LDL goal <70 11/24/2008  . Uncontrolled type 2 diabetes mellitus with peripheral neuropathy ([place]) 07/17/2006    Past Surgical History:  Procedure Laterality Date  . ARM SURGERY         Home Medications    Prior to Admission medications   Medication Sig Start Date End Date Taking? Authorizing Provider  cyclobenzaprine (FLEXERIL) 10 MG tablet Take 1 tablet (10 mg total) by mouth 2 (two) times daily as needed for muscle spasms. 02/17/17   Julianne Rice, MD  glucose blood test strip Use to check blood sugar 3 to 4 times daily. 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 Insulin dependent 07/10/15   Maryellen Pile, MD  insulin NPH-regular Human (NOVOLIN 70/30) (70-30) 100 UNIT/ML injection Inject 30 Units into the skin 2 (two) times daily with a meal.  07/02/16   Rivet, Sindy Guadeloupe, MD  insulin NPH-regular Human (NOVOLIN 70/30) (70-30) 100 UNIT/ML injection Inject 30 Units into the skin 2 (two) times daily with a meal. 05/25/17   Torria Fromer C, PA-C  Insulin Syringe-Needle U-100 31G X 15/64" 0.5 ML MISC The patient is insulin requiring, ICD 10 code E11.9. The patient tests 4 times per day. 08/20/16   Rivet, Sindy Guadeloupe, MD    Family History Family History  Problem Relation Age of Onset  . Hypertension Mother 38  . Cancer Mother        bladder cancer  . Alcohol abuse Father   . Hypertension Brother     Social History Social History   Tobacco Use  . Smoking status: Never Smoker  . Smokeless tobacco: Never Used  Substance Use Topics  . Alcohol use: No    Alcohol/week: 0.0 oz  . Drug use: No     Allergies   Sulfa antibiotics; Bactrim [sulfamethoxazole-trimethoprim]; and Cephalexin   Review of Systems Review of Systems  Constitutional: Negative for chills, diaphoresis and fever.  Respiratory: Negative for shortness of breath.   Cardiovascular: Negative for chest pain.  Gastrointestinal: Negative for abdominal pain, diarrhea, nausea and vomiting.  Endocrine: Positive for polyuria.       Hyperglycemia  Neurological: Negative for dizziness, seizures, syncope, weakness, light-headedness and headaches.  All other systems reviewed and are negative.    Physical Exam Updated Vital Signs BP (!) 156/96   Pulse 100  Temp 98.5 F (36.9 C) (Oral)   Resp 16   Ht 5\' 7"  (1.702 m)   Wt 65.8 kg (145 lb)   SpO2 99%   BMI 22.71 kg/m   Physical Exam  Constitutional: He appears well-developed and well-nourished. No distress.  HENT:  Head: Normocephalic and atraumatic.  Eyes: Conjunctivae are normal.  Neck: Neck supple.  Cardiovascular: Normal rate, regular rhythm, normal heart sounds and intact distal pulses.  Pulmonary/Chest: Effort normal and breath sounds normal. No respiratory distress.  Abdominal: Soft. There is no tenderness. There  is no guarding.  Musculoskeletal: He exhibits no edema.  Lymphadenopathy:    He has no cervical adenopathy.  Neurological: He is alert.  Skin: Skin is warm and dry. He is not diaphoretic.  Psychiatric: He has a normal mood and affect. His behavior is normal.  Nursing note and vitals reviewed.    ED Treatments / Results  Labs (all labs ordered are listed, but only abnormal results are displayed) Labs Reviewed  BASIC METABOLIC PANEL - Abnormal; Notable for the following components:      Result Value   Chloride 100 (*)    Glucose, Bld 234 (*)    All other components within normal limits  URINALYSIS, ROUTINE W REFLEX MICROSCOPIC - Abnormal; Notable for the following components:   Glucose, UA >=500 (*)    Hgb urine dipstick MODERATE (*)    Protein, ur 100 (*)    Bacteria, UA RARE (*)    All other components within normal limits  CBG MONITORING, ED - Abnormal; Notable for the following components:   Glucose-Capillary 273 (*)    All other components within normal limits  CBG MONITORING, ED - Abnormal; Notable for the following components:   Glucose-Capillary 219 (*)    All other components within normal limits  CBC    EKG  EKG Interpretation None       Radiology No results found.  Procedures Procedures (including critical care time)  Medications Ordered in ED Medications - No data to display   Initial Impression / Assessment and Plan / ED Course  I have reviewed the triage vital signs and the nursing notes.  Pertinent labs & imaging results that were available during my care of the patient were reviewed by me and considered in my medical decision making (see chart for details).     Patient presents with hyperglycemia.  No increased anion gap, no evidence of DKA.  Nontoxic-appearing.  Tolerating PO. I will give patient refill of his insulin. He has PCP follow up already scheduled.     Final Clinical Impressions(s) / ED Diagnoses   Final diagnoses:    Hyperglycemia    ED Discharge Orders        Ordered    insulin NPH-regular Human (NOVOLIN 70/30) (70-30) 100 UNIT/ML injection  2 times daily with meals     05/25/17 2147       Lorayne Bender, PA-C 05/26/17 0155    Tanna Furry, MD 06/03/17 [phone]

## 2017-06-10 ENCOUNTER — Encounter: Payer: Self-pay | Admitting: Nurse Practitioner

## 2017-06-10 ENCOUNTER — Ambulatory Visit: Payer: Self-pay | Attending: Nurse Practitioner | Admitting: Nurse Practitioner

## 2017-06-10 VITALS — BP 138/87 | HR 99 | Temp 98.6°F | Ht 67.0 in | Wt 150.0 lb

## 2017-06-10 DIAGNOSIS — E1165 Type 2 diabetes mellitus with hyperglycemia: Secondary | ICD-10-CM

## 2017-06-10 DIAGNOSIS — E785 Hyperlipidemia, unspecified: Secondary | ICD-10-CM | POA: Insufficient documentation

## 2017-06-10 DIAGNOSIS — Z79899 Other long term (current) drug therapy: Secondary | ICD-10-CM | POA: Insufficient documentation

## 2017-06-10 DIAGNOSIS — I10 Essential (primary) hypertension: Secondary | ICD-10-CM | POA: Insufficient documentation

## 2017-06-10 DIAGNOSIS — E1140 Type 2 diabetes mellitus with diabetic neuropathy, unspecified: Secondary | ICD-10-CM | POA: Insufficient documentation

## 2017-06-10 DIAGNOSIS — Z881 Allergy status to other antibiotic agents status: Secondary | ICD-10-CM | POA: Insufficient documentation

## 2017-06-10 DIAGNOSIS — IMO0002 Reserved for concepts with insufficient information to code with codable children: Secondary | ICD-10-CM

## 2017-06-10 DIAGNOSIS — E1142 Type 2 diabetes mellitus with diabetic polyneuropathy: Secondary | ICD-10-CM

## 2017-06-10 DIAGNOSIS — Z794 Long term (current) use of insulin: Secondary | ICD-10-CM | POA: Insufficient documentation

## 2017-06-10 DIAGNOSIS — Z882 Allergy status to sulfonamides status: Secondary | ICD-10-CM | POA: Insufficient documentation

## 2017-06-10 LAB — POCT URINALYSIS DIPSTICK
Bilirubin, UA: NEGATIVE
Glucose, UA: 500
Ketones, UA: NEGATIVE
Leukocytes, UA: NEGATIVE
Nitrite, UA: NEGATIVE
Protein, UA: 30
Spec Grav, UA: <=1.005 — AB (ref 1.010–1.025)
Urobilinogen, UA: 0.2 E.U./dL
pH, UA: 6 (ref 5.0–8.0)

## 2017-06-10 LAB — GLUCOSE, POCT (MANUAL RESULT ENTRY)
POC Glucose: 473 mg/dl — AB (ref 70–99)
POC Glucose: 351 mg/dl — AB (ref 70–99)

## 2017-06-10 LAB — POCT GLYCOSYLATED HEMOGLOBIN (HGB A1C): Hemoglobin A1C: 13.4

## 2017-06-10 MED ORDER — INSULIN PEN NEEDLE 31G X 5 MM MISC: 1 refills | Status: DC

## 2017-06-10 MED ORDER — LISINOPRIL 10 MG PO TABS: 10.0000 mg | ORAL_TABLET | Freq: Every day | ORAL | 3 refills | Status: DC

## 2017-06-10 MED ORDER — INSULIN LISPRO 100 UNIT/ML ~~LOC~~ SOLN: 10.0000 [IU] | Freq: Three times a day (TID) | SUBCUTANEOUS | 11 refills | Status: DC

## 2017-06-10 MED ORDER — INSULIN GLARGINE 100 UNIT/ML SOLOSTAR PEN: 20.0000 [IU] | PEN_INJECTOR | Freq: Every day | SUBCUTANEOUS | 5 refills | Status: DC

## 2017-06-10 MED ORDER — "INSULIN SYRINGE-NEEDLE U-100 31G X 15/64"" 0.5 ML MISC": 12 refills | Status: DC

## 2017-06-10 MED ORDER — INSULIN ASPART 100 UNIT/ML ~~LOC~~ SOLN
20.0000 [IU] | Freq: Once | SUBCUTANEOUS | Status: AC
  Administered 2017-06-10: 20 [IU] via SUBCUTANEOUS

## 2017-06-10 MED ORDER — TRUEPLUS LANCETS 28G MISC: 3 refills | Status: DC

## 2017-06-10 MED ORDER — GLUCOSE BLOOD VI STRP: ORAL_STRIP | 12 refills | Status: DC

## 2017-06-10 MED ORDER — ATORVASTATIN CALCIUM 20 MG PO TABS: 20.0000 mg | ORAL_TABLET | Freq: Every day | ORAL | 3 refills | Status: DC

## 2017-06-10 MED ORDER — GABAPENTIN 300 MG PO CAPS: 300.0000 mg | ORAL_CAPSULE | Freq: Three times a day (TID) | ORAL | 3 refills | Status: DC

## 2017-06-10 MED ORDER — TRUE METRIX METER W/DEVICE KIT: PACK | 0 refills | Status: DC

## 2017-06-10 MED FILL — !TRUE METRIX BLOOD GLUCOSE: 1 days supply | Qty: 1 | Fill #0

## 2017-06-10 MED FILL — GABAPENTIN 300 MG CAPSULE: 300 | 30 days supply | Qty: 90 | Fill #0

## 2017-06-10 MED FILL — ?HUMALOG 100 UNITS/ML VIAL: 100 | 28 days supply | Qty: 10 | Fill #0

## 2017-06-10 MED FILL — !LANTUS SOLOSTAR 100UNITS/M: 100 | 30 days supply | Qty: 6 | Fill #0

## 2017-06-10 MED FILL — TRUEplus LANCETS 28G MISC: 30 days supply | Qty: 100 | Fill #0

## 2017-06-10 MED FILL — TRUE METRIX TEST STRIP: 100 days supply | Qty: 100 | Fill #0

## 2017-06-10 MED FILL — LISINOPRIL 10 MG TABS: 10 | 30 days supply | Qty: 30 | Fill #0

## 2017-06-10 MED FILL — ?ATORVASTATIN 20MG TABLET: 20 | 30 days supply | Qty: 30 | Fill #0

## 2017-06-10 NOTE — Patient Instructions (Addendum)
Coping With Diabetes Diabetes (type 1 diabetes mellitus or type 2 diabetes mellitus) is a condition in which the body does not have enough of a hormone called insulin, or the body does not respond properly to insulin. Normally, insulin allows sugars (glucose) to enter cells in the body. The cells use glucose for energy. With diabetes, extra glucose builds up in the blood instead of going into cells, which results in high blood glucose (hyperglycemia). How to manage lifestyle changes Managing diabetes includes medical treatments as well as lifestyle changes. If diabetes is not managed well, serious physical and emotional complications can occur. Taking good care of yourself means that you are responsible for:  Monitoring glucose regularly.  Eating a healthy diet.  Exercising regularly.  Meeting with health care providers.  Taking medicines as directed.  Some people may feel a lot of stress about managing their diabetes. This is known as emotional distress, and it is very common. Living with diabetes can place you at risk for emotional distress, depression, or anxiety. These disorders can be confusing and can make diabetes management more difficult. How to recognize stress Emotional distress Symptoms of emotional distress include:  Anger about having a diagnosis of diabetes.  Fear or frustration about your diagnosis and the changes you need to make to manage the condition.  Being overly worried about the care that you need or the cost of the care you need.  Feeling like you caused your condition by doing something wrong.  Fear of unpredictable situations, like low or high blood glucose.  Feeling judged by your health care providers.  Feeling very alone with the disease.  Getting too tired or "burned out" with the demands of daily care.  Depression Having diabetes means that you are at a higher risk for depression. Having depression also means that you are at a higher risk for  diabetes. Your health care provider may test (screen) you for symptoms of depression. It is important to recognize depression symptoms and to start treatment for it soon after it is diagnosed. The following are some symptoms of depression:  Loss of interest in things that you used to enjoy.  Trouble sleeping, or often waking up early and not being able to get back to sleep.  A change in appetite.  Feeling tired most of the day.  Feeling nervous and anxious.  Feeling guilty and worrying that you are a burden to others.  Feeling depressed more often than you do not feel that way.  Thoughts of hurting yourself or feeling that you want to die.  If you have any of these symptoms for 2 weeks or longer, reach out to a health care provider. Where to find support  Ask your health care provider to recommend a therapist who understands both depression and diabetes.  Search for information and support from the American Diabetes Association: www.diabetes.org  Find a certified diabetes educator and make an appointment through [facility] Diabetes Educators: www.diabeteseducator.org Follow these instructions at home: Managing emotional distress The following are some ways to manage emotional distress:  Talk with your health care provider or certified diabetes educator. Consider working with a counselor or therapist.  Learn as much as you can about diabetes and its treatment. Meet with a certified diabetes educator or take a class to learn how to manage your condition.  Keep a journal of your thoughts and concerns.  Accept that some things are out of your control.  Talk with other people who have diabetes. It  can help to talk with others about the emotional distress that you feel.  Find ways to manage stress that work for you. These may include art or music therapy, exercise, meditation, and hobbies.  Seek support from spiritual leaders, family, and friends.  General  instructions  Follow your diabetes management plan.  Keep all follow-up visits as told by your health care provider. This is important. Get help right away if:  You have thoughts about hurting yourself or others. If you ever feel like you may hurt yourself or others, or have thoughts about taking your own life, get help right away. You can go to your nearest emergency department or call:  Your local emergency services (911 in the [place]  A suicide crisis helpline, such as the [facility] at 2503124528. This is open 24 hours a day.  Summary  Diabetes (type 1 diabetes mellitus or type 2 diabetes mellitus) is a condition in which the body does not have enough of a hormone called insulin, or the body does not respond properly to insulin.  Living with diabetes puts you at risk for medical issues, and it also puts you at risk for emotional issues such as emotional distress, depression, and anxiety.  Recognizing the symptoms of emotional distress and depression may help you avoid problems with your diabetes control. It is important to start treatment for emotional distress and depression soon after they are diagnosed.  Having diabetes means that you are at a higher risk for depression. Ask your health care provider to recommend a therapist who understands both depression and diabetes.  If you experience symptoms of emotional distress or depression, it is important to discuss this with your health care provider, certified diabetes educator, or therapist. This information is not intended to replace advice given to you by your health care provider. Make sure you discuss any questions you have with your health care provider. Document Released: 09/19/2016 Document Revised: 09/19/2016 Document Reviewed: 09/19/2016 Elsevier Interactive Patient Education  2018 [employer].  Diabetes Mellitus and Nutrition When you have diabetes (diabetes mellitus), it is very important  to have healthy eating habits because your blood sugar (glucose) levels are greatly affected by what you eat and drink. Eating healthy foods in the appropriate amounts, at about the same times every day, can help you:  Control your blood glucose.  Lower your risk of heart disease.  Improve your blood pressure.  Reach or maintain a healthy weight.  Every person with diabetes is different, and each person has different needs for a meal plan. Your health care provider may recommend that you work with a diet and nutrition specialist (dietitian) to make a meal plan that is best for you. Your meal plan may vary depending on factors such as:  The calories you need.  The medicines you take.  Your weight.  Your blood glucose, blood pressure, and cholesterol levels.  Your activity level.  Other health conditions you have, such as heart or kidney disease.  How do carbohydrates affect me? Carbohydrates affect your blood glucose level more than any other type of food. Eating carbohydrates naturally increases the amount of glucose in your blood. Carbohydrate counting is a method for keeping track of how many carbohydrates you eat. Counting carbohydrates is important to keep your blood glucose at a healthy level, especially if you use insulin or take certain oral diabetes medicines. It is important to know how many carbohydrates you can safely have in each meal. This is different for  every person. Your dietitian can help you calculate how many carbohydrates you should have at each meal and for snack. Foods that contain carbohydrates include:  Bread, cereal, rice, pasta, and crackers.  Potatoes and corn.  Peas, beans, and lentils.  Milk and yogurt.  Fruit and juice.  Desserts, such as cakes, cookies, ice cream, and candy.  How does alcohol affect me? Alcohol can cause a sudden decrease in blood glucose (hypoglycemia), especially if you use insulin or take certain oral diabetes medicines.  Hypoglycemia can be a life-threatening condition. Symptoms of hypoglycemia (sleepiness, dizziness, and confusion) are similar to symptoms of having too much alcohol. If your health care provider says that alcohol is safe for you, follow these guidelines:  Limit alcohol intake to no more than 1 drink per day for nonpregnant women and 2 drinks per day for men. One drink equals 12 oz of beer, 5 oz of wine, or 1 oz of hard liquor.  Do not drink on an empty stomach.  Keep yourself hydrated with water, diet soda, or unsweetened iced tea.  Keep in mind that regular soda, juice, and other mixers may contain a lot of sugar and must be counted as carbohydrates.  What are tips for following this plan? Reading food labels  Start by checking the serving size on the label. The amount of calories, carbohydrates, fats, and other nutrients listed on the label are based on one serving of the food. Many foods contain more than one serving per package.  Check the total grams (g) of carbohydrates in one serving. You can calculate the number of servings of carbohydrates in one serving by dividing the total carbohydrates by 15. For example, if a food has 30 g of total carbohydrates, it would be equal to 2 servings of carbohydrates.  Check the number of grams (g) of saturated and trans fats in one serving. Choose foods that have low or no amount of these fats.  Check the number of milligrams (mg) of sodium in one serving. Most people should limit total sodium intake to less than 2,300 mg per day.  Always check the nutrition information of foods labeled as "low-fat" or "nonfat". These foods may be higher in added sugar or refined carbohydrates and should be avoided.  Talk to your dietitian to identify your daily goals for nutrients listed on the label. Shopping  Avoid buying canned, premade, or processed foods. These foods tend to be high in fat, sodium, and added sugar.  Shop around the outside edge of the  grocery store. This includes fresh fruits and vegetables, bulk grains, fresh meats, and fresh dairy. Cooking  Use low-heat cooking methods, such as baking, instead of high-heat cooking methods like deep frying.  Cook using healthy oils, such as olive, canola, or sunflower oil.  Avoid cooking with butter, cream, or high-fat meats. Meal planning  Eat meals and snacks regularly, preferably at the same times every day. Avoid going long periods of time without eating.  Eat foods high in fiber, such as fresh fruits, vegetables, beans, and whole grains. Talk to your dietitian about how many servings of carbohydrates you can eat at each meal.  Eat 4-6 ounces of lean protein each day, such as lean meat, chicken, fish, eggs, or tofu. 1 ounce is equal to 1 ounce of meat, chicken, or fish, 1 egg, or 1/4 cup of tofu.  Eat some foods each day that contain healthy fats, such as avocado, nuts, seeds, and fish. Lifestyle   Check your blood  glucose regularly.  Exercise at least 30 minutes 5 or more days each week, or as told by your health care provider.  Take medicines as told by your health care provider.  Do not use any products that contain nicotine or tobacco, such as cigarettes and e-cigarettes. If you need help quitting, ask your health care provider.  Work with a Social worker or diabetes educator to identify strategies to manage stress and any emotional and social challenges. What are some questions to ask my health care provider?  Do I need to meet with a diabetes educator?  Do I need to meet with a dietitian?  What number can I call if I have questions?  When are the best times to check my blood glucose? Where to find more information:  American Diabetes Association: diabetes.org/food-and-fitness/food  Academy of Nutrition and Dietetics: PokerClues.dk  [employer] of Diabetes and Digestive and Kidney Diseases (NIH):  ContactWire.be Summary  A healthy meal plan will help you control your blood glucose and maintain a healthy lifestyle.  Working with a diet and nutrition specialist (dietitian) can help you make a meal plan that is best for you.  Keep in mind that carbohydrates and alcohol have immediate effects on your blood glucose levels. It is important to count carbohydrates and to use alcohol carefully. This information is not intended to replace advice given to you by your health care provider. Make sure you discuss any questions you have with your health care provider. Document Released: 01/31/2005 Document Revised: 06/10/2016 Document Reviewed: 06/10/2016 Elsevier Interactive Patient Education  2018 [employer].  Diabetic Neuropathy Diabetic neuropathy is a nerve disease or nerve damage that is caused by diabetes mellitus. About half of all people with diabetes mellitus have some form of nerve damage. Nerve damage is more common in those who have had diabetes mellitus for many years and who generally have not had good control of their blood sugar (glucose) level. Diabetic neuropathy is a common complication of diabetes mellitus. There are three common types of diabetic neuropathy and a fourth type that is less common and less understood:  Peripheral neuropathy-This is the most common type of diabetic neuropathy. It causes damage to the nerves of the feet and legs first and then eventually the hands and arms. The damage affects the ability to sense touch.  Autonomic neuropathy-This type causes damage to the autonomic nervous system, which controls the following functions: ? Heartbeat. ? Body temperature. ? Blood pressure. ? Urination. ? Digestion. ? Sweating. ? Sexual function.  Focal neuropathy-Focal neuropathy can be painful and unpredictable and occurs most often in older adults with diabetes mellitus. It involves a specific  nerve or one area and often comes on suddenly. It usually does not cause long-term problems.  Radiculoplexus neuropathy- Sometimes called lumbosacral radiculoplexus neuropathy, radiculoplexus neuropathy affects the nerves of the thighs, hips, buttocks, or legs. It is more common in people with type 2 diabetes mellitus and in older men. It is characterized by debilitating pain, weakness, and atrophy, usually in the thigh muscles.  What are the causes? The cause of peripheral, autonomic, and focal neuropathies is diabetes mellitus that is uncontrolled and high glucose levels. The cause of radiculoplexus neuropathy is unknown. However, it is thought to be caused by inflammation related to uncontrolled glucose levels. What are the signs or symptoms? Peripheral Neuropathy Peripheral neuropathy develops slowly over time. When the nerves of the feet and legs no longer work there may be:  Burning, stabbing, or aching pain in the legs  or feet.  Inability to feel pressure or pain in your feet. This can lead to: ? Thick calluses over pressure areas. ? Pressure sores. ? Ulcers.  Foot deformities.  Reduced ability to feel temperature changes.  Muscle weakness.  Autonomic Neuropathy The symptoms of autonomic neuropathy vary depending on which nerves are affected. Symptoms may include:  Problems with digestion, such as: ? Feeling sick to your stomach (nausea). ? Vomiting. ? Bloating. ? Constipation. ? Diarrhea. ? Abdominal pain.  Difficulty with urination. This occurs if you lose your ability to sense when your bladder is full. Problems include: ? Urine leakage (incontinence). ? Inability to empty your bladder completely (retention).  Rapid or irregular heartbeat (palpitations).  Blood pressure drops when you stand up (orthostatic hypotension). When you stand up you may feel: ? Dizzy. ? Weak. ? Faint.  In men, inability to attain and maintain an erection.  In women, vaginal dryness  and problems with decreased sexual desire and arousal.  Problems with body temperature regulation.  Increased or decreased sweating.  Focal Neuropathy  Abnormal eye movements or abnormal alignment of both eyes.  Weakness in the wrist.  Foot drop. This results in an inability to lift the foot properly and abnormal walking or foot movement.  Paralysis on one side of your face (Bell palsy).  Chest or abdominal pain. Radiculoplexus Neuropathy  Sudden, severe pain in your hip, thigh, or buttocks.  Weakness and wasting of thigh muscles.  Difficulty rising from a seated position.  Abdominal swelling.  Unexplained weight loss (usually more than 10 lb [4.5 kg]). How is this diagnosed? Peripheral Neuropathy Your senses may be tested. Sensory function testing can be done with:  A light touch using a monofilament.  A vibration with tuning fork.  A sharp sensation with a pin prick.  Other tests that can help diagnose neuropathy are:  Nerve conduction velocity. This test checks the transmission of an electrical current through a nerve.  Electromyography. This shows how muscles respond to electrical signals transmitted by nearby nerves.  Quantitative sensory testing. This is used to assess how your nerves respond to vibrations and changes in temperature.  Autonomic Neuropathy Diagnosis is often based on reported symptoms. Tell your health care provider if you experience:  Dizziness.  Constipation.  Diarrhea.  Inappropriate urination or inability to urinate.  Inability to get or maintain an erection.  Tests that may be done include:  Electrocardiography or Holter monitor. These are tests that can help show problems with the heart rate or heart rhythm.  An X-ray exam may be done.  Focal Neuropathy Diagnosis is made based on your symptoms and what your health care provider finds during your exam. Other tests may be done. They may include:  Nerve conduction  velocities. This checks the transmission of electrical current through a nerve.  Electromyography. This shows how muscles respond to electrical signals transmitted by nearby nerves.  Quantitative sensory testing. This test is used to assess how your nerves respond to vibration and changes in temperature.  Radiculoplexus Neuropathy  Often the first thing is to eliminate any other issue or problems that might be the cause, as there is no standard test for diagnosis.  X-ray exam of your spine and lumbar region.  Spinal tap to rule out cancer.  MRI to rule out other lesions. How is this treated? Once nerve damage occurs, it cannot be reversed. The goal of treatment is to keep the disease or nerve damage from getting worse and affecting more nerve  fibers. Controlling your blood glucose level is the key. Most people with radiculoplexus neuropathy see at least a partial improvement over time. You will need to keep your blood glucose and HbA1c levels in the target range determined by your health care provider. Things that help control blood glucose levels include:  Blood glucose monitoring.  Meal planning.  Physical activity.  Diabetes medicine.  Over time, maintaining lower blood glucose levels helps lessen symptoms. Sometimes, prescription pain medicine is needed. Follow these instructions at home:  Do not smoke.  Keep your blood glucose level in the range that you and your health care provider have determined acceptable for you.  Keep your blood pressure level in the range that you and your health care provider have determined acceptable for you.  Eat a well-balanced diet.  Be physically active every day. Include strength training and balance exercises.  Protect your feet. ? Check your feet every day for sores, cuts, blisters, or signs of infection. ? Wear padded socks and supportive shoes. Use orthotic inserts, if necessary. ? Regularly check the insides of your shoes for worn  spots. Make sure there are no rocks or other items inside your shoes before you put them on. Contact a health care provider if:  You have burning, stabbing, or aching pain in the legs or feet.  You are unable to feel pressure or pain in your feet.  You develop problems with digestion such as: ? Nausea. ? Vomiting. ? Bloating. ? Constipation. ? Diarrhea. ? Abdominal pain.  You have difficulty with urination, such as: ? Incontinence. ? Retention.  You have palpitations.  You develop orthostatic hypotension. When you stand up you may feel: ? Dizzy. ? Weak. ? Faint.  You cannot attain and maintain an erection (in men).  You have vaginal dryness and problems with decreased sexual desire and arousal (in women).  You have severe pain in your thighs, legs, or buttocks.  You have unexplained weight loss. This information is not intended to replace advice given to you by your health care provider. Make sure you discuss any questions you have with your health care provider. Document Released: 07/15/2001 Document Revised: 10/12/2015 Document Reviewed: 10/15/2012 Elsevier Interactive Patient Education  2017 [employer].

## 2017-06-10 NOTE — Progress Notes (Signed)
Assessment & Plan:  [name] was seen today for new patient (initial visit).  Diagnoses and all orders for this visit:  Uncontrolled type 2 diabetes mellitus with peripheral neuropathy (HCC) -     Glucose (CBG) -     HgB A1c -     insulin aspart (novoLOG) injection 20 Units -     Urinalysis Dipstick -     Microalbumin / creatinine urine ratio -     Lipid panel -     CBC -     Basic metabolic panel -     Insulin Glargine (LANTUS SOLOSTAR) 100 UNIT/ML Solostar Pen; Inject 20 Units into the skin daily at 10 pm. -     insulin lispro (HUMALOG) 100 UNIT/ML injection; Inject 0.1 mLs (10 Units total) into the skin 3 (three) times daily before meals. -     Insulin Pen Needle (B-D UF III MINI PEN NEEDLES) 31G X 5 MM MISC; Use as instructed -     Insulin Syringe-Needle U-100 31G X 15/64" 0.5 ML MISC; The patient is insulin requiring, ICD 10 code E11.9. The patient tests 4 times per day. -     Blood Glucose Monitoring Suppl (TRUE METRIX METER) w/Device KIT; Use as instructed -     TRUEPLUS LANCETS 28G MISC; Use as instructed -     glucose blood test strip; Use to check fasting blood sugar once daily. 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 Insulin dependent -     gabapentin (NEURONTIN) 300 MG capsule; Take 1 capsule (300 mg total) by mouth 3 (three) times daily. -     Glucose (CBG)  Diabetes is poorly controlled. Advised patient to keep a fasting blood sugar log fast, 2 hours post lunch and bedtime which will be reviewed at the next office visit.  Essential hypertension -     lisinopril (PRINIVIL,ZESTRIL) 10 MG tablet; Take 1 tablet (10 mg total) by mouth daily.  Continue all antihypertensives as prescribed.  Remember to bring in your blood pressure log with you for your follow up appointment.  DASH/Mediterranean Diets are healthier choices for HTN.   Hyperlipidemia LDL goal <70 -     atorvastatin (LIPITOR) 20 MG tablet; Take 1 tablet (20 mg total) by mouth daily.  Work on a low fat, heart healthy diet and  participate in regular aerobic exercise program to control as well by working out at least 150 minutes per week. No fried foods. No junk foods, sodas, sugary drinks, unhealthy snacking, or smoking.    Patient has been counseled on age-appropriate routine health concerns for screening and prevention. These are reviewed and up-to-date. Referrals have been placed accordingly. Immunizations are up-to-date or declined.    Subjective:   Chief Complaint  Patient presents with  . New Patient (Initial Visit)    Patient is here to establish care for his diabetes. Patient stated both of his hand have nerve pain and most of the time the pain comes at night. Patient said it has been going on for a while.   HPI [name] 47 y.o. male presents to office today to establish care. He has a history of  Hypertension, Hyperlipidemia and Diabetes Mellitus. He ran out of his medications in November.    Diabetes Mellitus He was diagnosed with DM in 2006. He has diabetic neuropathy. He is not checking his blood sugars. Does not know where his meter or supplies are located. He was previously receiving his primary care through [facility] however he reports "I got kicked  out from there". He has multiple no shows there. Reports problems with transportation and obtaining bus passes. He is not diet, exercise or medication adherent.  He has had diabetes nutrition in the past. He knows what to eat and what not to eat "but I just get tempted".His A1c is poorly controlled. He is only taking Novolog 70/30 30 units BID. Will start him on Lantus and switch Novolog to Humalog.  Lab Results  Component Value Date   HGBA1C 13.4 06/10/2017   Essential Hypertension Blood pressure is well controlled today. He endorses medication compliance. Denies chest pain, shortness of breath, palpitations, lightheadedness, dizziness, headaches or BLE edema.  BP Readings from Last 3 Encounters:  06/10/17 138/87  05/25/17 (!) 163/99  02/17/17  118/81   Hyperlipidemia Lipids have been elevated in the past. He has not been prescribed a statin. Will start lipitor today.  Lab Results  Component Value Date   CHOL 159 06/10/2017   CHOL 159 09/06/2014   CHOL 155 05/31/2013   Lab Results  Component Value Date   HDL 36 (L) 06/10/2017   HDL 37 (L) 09/06/2014   HDL 31 (L) 05/31/2013   Lab Results  Component Value Date   LDLCALC 88 06/10/2017   LDLCALC 84 09/06/2014   LDLCALC 77 05/31/2013   Lab Results  Component Value Date   TRIG 177 (H) 06/10/2017   TRIG 191 (H) 09/06/2014   TRIG 235 (H) 05/31/2013   Lab Results  Component Value Date   CHOLHDL 4.4 06/10/2017   CHOLHDL 4.3 09/06/2014   CHOLHDL 5.0 05/31/2013    Review of Systems  Constitutional: Negative for fever, malaise/fatigue and weight loss.  HENT: Negative.  Negative for nosebleeds.   Eyes: Negative.  Negative for blurred vision, double vision and photophobia.  Respiratory: Negative.  Negative for cough and shortness of breath.   Cardiovascular: Negative.  Negative for chest pain, palpitations and leg swelling.  Gastrointestinal: Negative.  Negative for abdominal pain, constipation, diarrhea, heartburn, nausea and vomiting.  Musculoskeletal: Negative.  Negative for myalgias.  Neurological: Positive for tingling and sensory change. Negative for dizziness, focal weakness, seizures and headaches.  Endo/Heme/Allergies: Negative for environmental allergies.  Psychiatric/Behavioral: Negative.  Negative for suicidal ideas.    Past Medical History:  Diagnosis Date  . Diabetes mellitus type II, uncontrolled ([place]) 07/17/2006  . ESSENTIAL HYPERTENSION 07/17/2006  . HYPERLIPIDEMIA 11/24/2008  . Pneumonia 10/22/2011    Past Surgical History:  Procedure Laterality Date  . ARM SURGERY      Family History  Problem Relation Age of Onset  . Hypertension Mother 54  . Cancer Mother        bladder cancer  . Alcohol abuse Father   . Hypertension Brother     Social  History Reviewed with no changes to be made today.   Outpatient Medications Prior to Visit  Medication Sig Dispense Refill  . cyclobenzaprine (FLEXERIL) 10 MG tablet Take 1 tablet (10 mg total) by mouth 2 (two) times daily as needed for muscle spasms. (Patient not taking: Reported on 06/10/2017) 20 tablet 0  . glucose blood test strip Use to check blood sugar 3 to 4 times daily. 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 Insulin dependent (Patient not taking: Reported on 06/10/2017) 100 each 12  . insulin NPH-regular Human (NOVOLIN 70/30) (70-30) 100 UNIT/ML injection Inject 30 Units into the skin 2 (two) times daily with a meal. (Patient not taking: Reported on 06/10/2017) 30 mL 5  . insulin NPH-regular Human (NOVOLIN 70/30) (70-30) 100 UNIT/ML injection  Inject 30 Units into the skin 2 (two) times daily with a meal. (Patient not taking: Reported on 06/10/2017) 10 mL 1  . Insulin Syringe-Needle U-100 31G X 15/64" 0.5 ML MISC The patient is insulin requiring, ICD 10 code E11.9. The patient tests 4 times per day. (Patient not taking: Reported on 06/10/2017) 100 each 12   No facility-administered medications prior to visit.     Allergies  Allergen Reactions  . Sulfa Antibiotics Rash    Severe rash. Do not give sulfa medications  . Bactrim [Sulfamethoxazole-Trimethoprim]     Rash  . Cephalexin     Patient with severe drug reaction including exfoliating skin rash and hypotension after being prescribed both cephalexin and sulfamethoxazole-trimethoprim simultaneously. Favor SMX as much more likely culprit as patient had tolerated other cephalosporins in the past, but cannot say with complete certainty that this was not related to cephalexin.        Objective:    BP 138/87 (BP Location: Left Arm, Patient Position: Sitting, Cuff Size: Normal)   Pulse 99   Temp 98.6 F (37 C) (Oral)   Ht 5' 7"  (1.702 m)   Wt 150 lb (68 kg)   SpO2 98%   BMI 23.49 kg/m  Wt Readings from Last 3 Encounters:  06/10/17 150 lb (68 kg)    05/25/17 145 lb (65.8 kg)  08/20/16 145 lb 3.2 oz (65.9 kg)    Physical Exam  Constitutional: He is oriented to person, place, and time. He appears well-developed and well-nourished. He is cooperative.  HENT:  Head: Normocephalic and atraumatic.  Eyes: EOM are normal.  Neck: Normal range of motion.  Cardiovascular: Normal rate, regular rhythm, normal heart sounds and intact distal pulses. Exam reveals no gallop and no friction rub.  No murmur heard. Pulmonary/Chest: Effort normal and breath sounds normal. No tachypnea. No respiratory distress. He has no decreased breath sounds. He has no wheezes. He has no rhonchi. He has no rales. He exhibits no tenderness.  Abdominal: Soft. Bowel sounds are normal.  Musculoskeletal: Normal range of motion. He exhibits no edema.  Neurological: He is alert and oriented to person, place, and time. Coordination normal.  Skin: Skin is warm and dry.  Psychiatric: He has a normal mood and affect. His speech is normal and behavior is normal. Judgment and thought content normal.  Nursing note and vitals reviewed.     Patient has been counseled extensively about nutrition and exercise as well as the importance of adherence with medications and regular follow-up. The patient was given clear instructions to go to ER or return to medical center if symptoms don't improve, worsen or new problems develop. The patient verbalized understanding.   Follow-up: Return in about 3 months (around 09/08/2017) for HTN/HPL/DM.   Gildardo Pounds, FNP-BC [facility] and [place], [place]   06/11/2017, 10:17 PM

## 2017-06-11 ENCOUNTER — Telehealth: Payer: Self-pay

## 2017-06-11 LAB — LIPID PANEL
Chol/HDL Ratio: 4.4 ratio (ref 0.0–5.0)
Cholesterol, Total: 159 mg/dL (ref 100–199)
HDL: 36 mg/dL — ABNORMAL LOW (ref >39)
LDL Calculated: 88 mg/dL (ref 0–99)
Triglycerides: 177 mg/dL — ABNORMAL HIGH (ref 0–149)
VLDL Cholesterol Cal: 35 mg/dL (ref 5–40)

## 2017-06-11 LAB — BASIC METABOLIC PANEL
BUN/Creatinine Ratio: 9 (ref 9–20)
BUN: 8 mg/dL (ref 6–24)
CO2: 27 mmol/L (ref 20–29)
Calcium: 9.3 mg/dL (ref 8.7–10.2)
Chloride: 98 mmol/L (ref 96–106)
Creatinine, Ser: 0.93 mg/dL (ref 0.76–1.27)
GFR calc Af Amer: 115 mL/min/{1.73_m2} (ref >59)
GFR calc non Af Amer: 99 mL/min/{1.73_m2} (ref >59)
Glucose: 298 mg/dL — ABNORMAL HIGH (ref 65–99)
Potassium: 4.3 mmol/L (ref 3.5–5.2)
Sodium: 139 mmol/L (ref 134–144)

## 2017-06-11 LAB — MICROALBUMIN / CREATININE URINE RATIO
Creatinine, Urine: 28.1 mg/dL
Microalb/Creat Ratio: 634.2 mg/g creat — ABNORMAL HIGH (ref 0.0–30.0)
Microalbumin, Urine: 178.2 ug/mL

## 2017-06-11 LAB — CBC
Hematocrit: 41.3 % (ref 37.5–51.0)
Hemoglobin: 14 g/dL (ref 13.0–17.7)
MCH: 28.5 pg (ref 26.6–33.0)
MCHC: 33.9 g/dL (ref 31.5–35.7)
MCV: 84 fL (ref 79–97)
Platelets: 292 10*3/uL (ref 150–379)
RBC: 4.92 x10E6/uL (ref 4.14–5.80)
RDW: 13.6 % (ref 12.3–15.4)
WBC: 7.3 10*3/uL (ref 3.4–10.8)

## 2017-06-11 NOTE — Telephone Encounter (Signed)
-----   Message from Gildardo Pounds, NP sent at 06/10/2017 11:24 PM EST ----- Urinalysis is abnormal. Will repeat at your next office visit.

## 2017-06-13 NOTE — Telephone Encounter (Signed)
-----   Message from Gildardo Pounds, NP sent at 06/11/2017 10:22 PM EST ----- Your labs show that diabetes is greatly affecting your kidneys. Please make sure you are taking your lisinopril every day. Cholesterol levels are elevated. Please make sure you are taking your lipitor. No fried foods, fatty foods, take out foods or snacks like potato chips, cookies, candys, sweets.

## 2017-06-13 NOTE — Telephone Encounter (Signed)
-----   Message from Gildardo Pounds, NP sent at 06/10/2017 11:24 PM EST ----- Urinalysis is abnormal. Will repeat at your next office visit.

## 2017-06-13 NOTE — Telephone Encounter (Signed)
CMA called patient regarding lab result and PCP advising.   Patient understood.

## 2017-06-18 ENCOUNTER — Ambulatory Visit: Payer: Self-pay

## 2017-07-28 ENCOUNTER — Telehealth: Payer: Self-pay | Admitting: Nurse Practitioner

## 2017-07-28 NOTE — Telephone Encounter (Signed)
I called extension and lvm. I have only seen [name] once so I am not sure what type of medical necessity papers he will need filed or if I will be able to sign them. If office calls back I will need to know what medical necessity papers is patient attempting to file

## 2017-07-28 NOTE — Telephone Encounter (Signed)
[place] from [employer] called to request an update on the certificate of medical necessity she faxed over. Please follow up [phone]

## 2017-07-29 NOTE — Telephone Encounter (Signed)
There is a faxed in PCP box for diabetic supplies for PCP to fill out.  PCP will be back on Wednesday, please inform patient that.

## 2017-07-30 ENCOUNTER — Ambulatory Visit: Payer: Self-pay

## 2017-08-01 NOTE — Telephone Encounter (Signed)
[place] called to see if pcp had any further questions regarding paperwork, and or which direction she needs to direct the patient.

## 2017-08-03 NOTE — Telephone Encounter (Signed)
Papers have been signed and will be faxed

## 2017-08-04 ENCOUNTER — Telehealth: Payer: Self-pay | Admitting: Nurse Practitioner

## 2017-08-04 NOTE — Telephone Encounter (Signed)
CMA called patient to inform Fax has been sent.  Patient understood.

## 2017-08-04 NOTE — Telephone Encounter (Signed)
Pt called to speak with the nurse or the PCP about his diabetes equipment, please follow up

## 2017-09-08 ENCOUNTER — Ambulatory Visit: Payer: Self-pay | Attending: Nurse Practitioner | Admitting: Nurse Practitioner

## 2017-09-08 ENCOUNTER — Encounter: Payer: Self-pay | Admitting: Nurse Practitioner

## 2017-09-08 VITALS — BP 120/84 | HR 96 | Temp 98.4°F | Ht 67.0 in | Wt 152.8 lb

## 2017-09-08 DIAGNOSIS — Z79899 Other long term (current) drug therapy: Secondary | ICD-10-CM | POA: Insufficient documentation

## 2017-09-08 DIAGNOSIS — I10 Essential (primary) hypertension: Secondary | ICD-10-CM | POA: Insufficient documentation

## 2017-09-08 DIAGNOSIS — Z881 Allergy status to other antibiotic agents status: Secondary | ICD-10-CM | POA: Insufficient documentation

## 2017-09-08 DIAGNOSIS — E1142 Type 2 diabetes mellitus with diabetic polyneuropathy: Secondary | ICD-10-CM | POA: Insufficient documentation

## 2017-09-08 DIAGNOSIS — Z794 Long term (current) use of insulin: Secondary | ICD-10-CM | POA: Insufficient documentation

## 2017-09-08 DIAGNOSIS — IMO0002 Reserved for concepts with insufficient information to code with codable children: Secondary | ICD-10-CM

## 2017-09-08 DIAGNOSIS — Z760 Encounter for issue of repeat prescription: Secondary | ICD-10-CM | POA: Insufficient documentation

## 2017-09-08 DIAGNOSIS — Z882 Allergy status to sulfonamides status: Secondary | ICD-10-CM | POA: Insufficient documentation

## 2017-09-08 DIAGNOSIS — E785 Hyperlipidemia, unspecified: Secondary | ICD-10-CM | POA: Insufficient documentation

## 2017-09-08 DIAGNOSIS — E1165 Type 2 diabetes mellitus with hyperglycemia: Secondary | ICD-10-CM

## 2017-09-08 LAB — POCT URINALYSIS DIPSTICK
Bilirubin, UA: NEGATIVE
Ketones, UA: NEGATIVE
Leukocytes, UA: NEGATIVE
Nitrite, UA: NEGATIVE
Protein, UA: >=300
Spec Grav, UA: 1.025 (ref 1.010–1.025)
Urobilinogen, UA: 0.2 E.U./dL
pH, UA: 5 (ref 5.0–8.0)

## 2017-09-08 LAB — GLUCOSE, POCT (MANUAL RESULT ENTRY)
POC Glucose: 378 mg/dl — AB (ref 70–99)
POC Glucose: 214 mg/dl — AB (ref 70–99)

## 2017-09-08 LAB — POCT GLYCOSYLATED HEMOGLOBIN (HGB A1C): Hemoglobin A1C: 13.5

## 2017-09-08 MED ORDER — INSULIN PEN NEEDLE 31G X 5 MM MISC: 1 refills | Status: DC

## 2017-09-08 MED ORDER — TRUEPLUS LANCETS 28G MISC: 3 refills | Status: DC

## 2017-09-08 MED ORDER — INSULIN DETEMIR 100 UNIT/ML FLEXPEN: 60.0000 [IU] | PEN_INJECTOR | Freq: Every day | SUBCUTANEOUS | 11 refills | Status: DC

## 2017-09-08 MED ORDER — INSULIN ASPART 100 UNIT/ML ~~LOC~~ SOLN
20.0000 [IU] | Freq: Once | SUBCUTANEOUS | Status: AC
  Administered 2017-09-08: 20 [IU] via SUBCUTANEOUS

## 2017-09-08 MED ORDER — INSULIN LISPRO 100 UNIT/ML (KWIKPEN)
10.0000 [IU] | PEN_INJECTOR | Freq: Three times a day (TID) | SUBCUTANEOUS | 11 refills | Status: DC
Start: 2017-09-08 — End: 2018-01-23

## 2017-09-08 MED FILL — !HUMALOG 100 UNITS/ML KWIKP: 100 | 30 days supply | Qty: 9 | Fill #0

## 2017-09-08 NOTE — Patient Instructions (Signed)
Diabetes blood sugar goals  Fasting in AM before breakfast which means at least 8 hrs of no eating or drinking) except water or unsweetened coffee or tea): 90-110 2 hrs after meals: < 160,   Hypoglycemia or low blood sugar: < 70 (You should not have hypoglycemia.)  Aim for 30 minutes of exercise most days. Rethink what you drink. Water is great! Aim for 2-3 Carb Choices per meal (30-45 grams) +/- 1 either way  Aim for 0-15 Carbs per snack if hungry  Include protein in moderation with your meals and snacks  Consider reading food labels for Total Carbohydrate and Fat Grams of foods  Consider checking BG at alternate times per day  Continue taking medication as directed Be mindful about how much sugar you are adding to beverages and other foods. Fruit Punch - find one with no sugar  Measure and decrease portions of carbohydrate foods  Make your plate and don't go back for seconds

## 2017-09-08 NOTE — Progress Notes (Signed)
Assessment & Plan:  [name] was seen today for follow-up and medication refill.  Diagnoses and all orders for this visit:  Uncontrolled type 2 diabetes mellitus with peripheral neuropathy (HCC) -     Glucose (CBG) -     HgB A1c -     Urinalysis Dipstick -     insulin aspart (novoLOG) injection 20 Units -     insulin lispro (HUMALOG KWIKPEN) 100 UNIT/ML KiwkPen; Inject 0.1 mLs (10 Units total) into the skin 3 (three) times daily. -     Insulin Detemir (LEVEMIR) 100 UNIT/ML Pen; Inject 60 Units into the skin daily at 10 pm. -     Insulin Pen Needle (B-D UF III MINI PEN NEEDLES) 31G X 5 MM MISC; Use as instructed -     TRUEPLUS LANCETS 28G MISC; Use as instructed -     Glucose (CBG) Continue blood sugar control as discussed in office today, low carbohydrate diet, and regular physical exercise as tolerated, 150 minutes per week (30 min each day, 5 days per week, or 50 min 3 days per week). Keep blood sugar logs with fasting goal of 80-130 mg/dl, post prandial less than 180.  For Hypoglycemia: BS <60 and Hyperglycemia BS >400; contact the clinic ASAP. Annual eye exams and foot exams are recommended.  Hyperlipidemia LDL goal <70 INSTRUCTIONS: Work on a low fat, heart healthy diet and participate in regular aerobic exercise program by working out at least 150 minutes per week. No fried foods. No junk foods, sodas, sugary drinks, unhealthy snacking, alcohol or smoking.     Essential hypertension Continue all antihypertensives as prescribed.  Remember to bring in your blood pressure log with you for your follow up appointment.  DASH/Mediterranean Diets are healthier choices for HTN.      Patient has been counseled on age-appropriate routine health concerns for screening and prevention. These are reviewed and up-to-date. Referrals have been placed accordingly. Immunizations are up-to-date or declined.    Subjective:   Chief Complaint  Patient presents with  . Follow-up    Pt. is here  for follow-up for hypertension, cholesterol, and diabetes.   . Medication Refill    Pt. is not taking his Lantus for almost 3 months due to making him use the bathroom. Need refills for Humalog and syringes.    HPI [name] 47 y.o. male presents to office today for follow up of chronic medical conditions.  Diabetes Mellitus Type 2 Chronic. Poorly controlled. A1c is increasing. He is noncompliant with taking most of his diabetic medications. Glucose today is >350. He endorses eating  ginger snaps this morning.  He stopped taking Lantus a few months ago due to diarrhea however he did not call the office to let me know.  He has not been taking his humalog as prescribed. Only taking it twice a day instead of TID. He is checking his blood sugars 3-4 times per day and using 2 different meters: a [employer] meter as a trial and his true metrix meter which was prescribed through this office. He states he looked on facebook and saw the [employer] and was approved for trial use. He states if he decides to keep the [employer]tronics meter it will cost him over $600. He states his insurance from his job will pay for it however according to our records he is uninsured. I instructed him that unless he changes his dietary habits there is no amount of insulin that will prevent diabetic complications. He denies any  hypo or hyperglycemic symptoms.  Lab Results  Component Value Date   HGBA1C 13.5 09/08/2017     Essential Hypertension Chronic. Stable. Well controlled. Endorses medication compliance. Taking lisinopril daily as prescribed. Denies chest pain, shortness of breath, palpitations, lightheadedness, dizziness, headaches or BLE edema.  BP Readings from Last 3 Encounters:  09/08/17 120/84  06/10/17 138/87  05/25/17 (!) 163/99   Hyperlipidemia Chronic. LDL not at goal. Patient presents for follow up to hyperlipidemia.  He is medication compliant. Atorvastatin 44m daily.  He is not diet  compliant and denies exertional chest pressure/discomfort, lower extremity edema and skin xanthelasma or statin intolerance including myalgias.  Lab Results  Component Value Date   CHOL 159 06/10/2017   Lab Results  Component Value Date   HDL 36 (L) 06/10/2017   Lab Results  Component Value Date   LDLCALC 88 06/10/2017   Lab Results  Component Value Date   TRIG 177 (H) 06/10/2017   Lab Results  Component Value Date   CHOLHDL 4.4 06/10/2017    Review of Systems  Constitutional: Negative for fever, malaise/fatigue and weight loss.  HENT: Negative.  Negative for nosebleeds.   Eyes: Negative.  Negative for blurred vision, double vision and photophobia.  Respiratory: Negative.  Negative for cough and shortness of breath.   Cardiovascular: Negative.  Negative for chest pain, palpitations and leg swelling.  Gastrointestinal: Negative.  Negative for heartburn, nausea and vomiting.  Musculoskeletal: Negative.  Negative for myalgias.  Neurological: Negative.  Negative for dizziness, focal weakness, seizures and headaches.  Psychiatric/Behavioral: Negative.  Negative for suicidal ideas.    Past Medical History:  Diagnosis Date  . Diabetes mellitus type II, uncontrolled ([place] 07/17/2006  . ESSENTIAL HYPERTENSION 07/17/2006  . HYPERLIPIDEMIA 11/24/2008  . Pneumonia 10/22/2011    Past Surgical History:  Procedure Laterality Date  . ARM SURGERY      Family History  Problem Relation Age of Onset  . Hypertension Mother 563 . Cancer Mother        bladder cancer  . Alcohol abuse Father   . Hypertension Brother     Social History Reviewed with no changes to be made today.   Outpatient Medications Prior to Visit  Medication Sig Dispense Refill  . atorvastatin (LIPITOR) 20 MG tablet Take 1 tablet (20 mg total) by mouth daily. 90 tablet 3  . Blood Glucose Monitoring Suppl (TRUE METRIX METER) w/Device KIT Use as instructed 1 kit 0  . cyclobenzaprine (FLEXERIL) 10 MG tablet Take 1  tablet (10 mg total) by mouth 2 (two) times daily as needed for muscle spasms. 20 tablet 0  . gabapentin (NEURONTIN) 300 MG capsule Take 1 capsule (300 mg total) by mouth 3 (three) times daily. 90 capsule 3  . glucose blood test strip Use to check fasting blood sugar once daily. 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 Insulin dependent 100 each 12  . lisinopril (PRINIVIL,ZESTRIL) 10 MG tablet Take 1 tablet (10 mg total) by mouth daily. 90 tablet 3  . insulin lispro (HUMALOG) 100 UNIT/ML injection Inject 0.1 mLs (10 Units total) into the skin 3 (three) times daily before meals. 10 mL 11  . Insulin Pen Needle (B-D UF III MINI PEN NEEDLES) 31G X 5 MM MISC Use as instructed 90 each 1  . Insulin Syringe-Needle U-100 31G X 15/64" 0.5 ML MISC The patient is insulin requiring, ICD 10 code E11.9. The patient tests 4 times per day. 100 each 12  . TRUEPLUS LANCETS 28G MISC Use as instructed 100 each  3  . Insulin Glargine (LANTUS SOLOSTAR) 100 UNIT/ML Solostar Pen Inject 20 Units into the skin daily at 10 pm. (Patient not taking: Reported on 09/08/2017) 5 pen 5   No facility-administered medications prior to visit.     Allergies  Allergen Reactions  . Sulfa Antibiotics Rash    Severe rash. Do not give sulfa medications  . Bactrim [Sulfamethoxazole-Trimethoprim]     Rash  . Cephalexin     Patient with severe drug reaction including exfoliating skin rash and hypotension after being prescribed both cephalexin and sulfamethoxazole-trimethoprim simultaneously. Favor SMX as much more likely culprit as patient had tolerated other cephalosporins in the past, but cannot say with complete certainty that this was not related to cephalexin.        Objective:    BP 120/84 (BP Location: Left Arm, Patient Position: Sitting, Cuff Size: Normal)   Pulse 96   Temp 98.4 F (36.9 C) (Oral)   Ht _0  (1.702 m)   Wt 152 lb 12.8 oz (69.3 kg)   SpO2 93%   BMI 23.93 kg/m  Wt Readings from Last 3 Encounters:  09/08/17 152 lb 12.8 oz (69.3  kg)  06/10/17 150 lb (68 kg)  05/25/17 145 lb (65.8 kg)    Physical Exam  Constitutional: He is oriented to person, place, and time. He appears well-developed and well-nourished. He is cooperative.  HENT:  Head: Normocephalic and atraumatic.  Eyes: EOM are normal.  Neck: Normal range of motion.  Cardiovascular: Normal rate, regular rhythm and normal heart sounds. Exam reveals no gallop and no friction rub.  No murmur heard. Pulmonary/Chest: Effort normal and breath sounds normal. No tachypnea. No respiratory distress. He has no decreased breath sounds. He has no wheezes. He has no rhonchi. He has no rales. He exhibits no tenderness.  Abdominal: Soft. Bowel sounds are normal.  Musculoskeletal: Normal range of motion. He exhibits no edema.  Neurological: He is alert and oriented to person, place, and time. Coordination normal.  Skin: Skin is warm and dry.  Psychiatric: He has a normal mood and affect. His behavior is normal. Judgment and thought content normal.  Nursing note and vitals reviewed.      Patient has been counseled extensively about nutrition and exercise as well as the importance of adherence with medications and regular follow-up. The patient was given clear instructions to go to ER or return to medical center if symptoms don't improve, worsen or new problems develop. The patient verbalized understanding.   Follow-up: Return in about 1 month (around 10/06/2017) for DM.   Gildardo Pounds, FNP-BC [facility] and [place], [place]   09/08/2017, 6:42 PM

## 2017-10-22 ENCOUNTER — Ambulatory Visit: Payer: Self-pay | Admitting: Nurse Practitioner

## 2017-11-29 ENCOUNTER — Emergency Department (HOSPITAL_COMMUNITY)
Admission: EM | Admit: 2017-11-29 | Discharge: 2017-11-30 | Disposition: A | Discharge status: Home / Self Care | Payer: Self-pay | Attending: Emergency Medicine | Admitting: Emergency Medicine

## 2017-11-29 ENCOUNTER — Other Ambulatory Visit: Payer: Self-pay

## 2017-11-29 ENCOUNTER — Encounter (HOSPITAL_COMMUNITY): Payer: Self-pay | Admitting: Emergency Medicine

## 2017-11-29 DIAGNOSIS — I10 Essential (primary) hypertension: Secondary | ICD-10-CM | POA: Insufficient documentation

## 2017-11-29 DIAGNOSIS — Z79899 Other long term (current) drug therapy: Secondary | ICD-10-CM | POA: Insufficient documentation

## 2017-11-29 DIAGNOSIS — R739 Hyperglycemia, unspecified: Secondary | ICD-10-CM

## 2017-11-29 DIAGNOSIS — E1165 Type 2 diabetes mellitus with hyperglycemia: Secondary | ICD-10-CM | POA: Insufficient documentation

## 2017-11-29 LAB — CBG MONITORING, ED: Glucose-Capillary: 457 mg/dL — ABNORMAL HIGH (ref 70–99)

## 2017-11-29 NOTE — ED Triage Notes (Signed)
Pt states having freq urination and freq thirst onset x2 days  Worse today  CBG 457 in triage Hx diabetes

## 2017-11-30 LAB — CBC
HCT: 36.7 % — ABNORMAL LOW (ref 39.0–52.0)
Hemoglobin: 12.4 g/dL — ABNORMAL LOW (ref 13.0–17.0)
MCH: 28.1 pg (ref 26.0–34.0)
MCHC: 33.8 g/dL (ref 30.0–36.0)
MCV: 83.2 fL (ref 78.0–100.0)
Platelets: 257 10*3/uL (ref 150–400)
RBC: 4.41 MIL/uL (ref 4.22–5.81)
RDW: 12.3 % (ref 11.5–15.5)
WBC: 5.7 10*3/uL (ref 4.0–10.5)

## 2017-11-30 LAB — I-STAT CHEM 8, ED
BUN: 18 mg/dL (ref 6–20)
Calcium, Ion: 1.12 mmol/L — ABNORMAL LOW (ref 1.15–1.40)
Chloride: 96 mmol/L — ABNORMAL LOW (ref 98–111)
Creatinine, Ser: 1 mg/dL (ref 0.61–1.24)
Glucose, Bld: 534 mg/dL (ref 70–99)
HCT: 37 % — ABNORMAL LOW (ref 39.0–52.0)
Hemoglobin: 12.6 g/dL — ABNORMAL LOW (ref 13.0–17.0)
Potassium: 4 mmol/L (ref 3.5–5.1)
Sodium: 136 mmol/L (ref 135–145)
TCO2: 27 mmol/L (ref 22–32)

## 2017-11-30 LAB — CBG MONITORING, ED: Glucose-Capillary: 375 mg/dL — ABNORMAL HIGH (ref 70–99)

## 2017-11-30 MED ORDER — SODIUM CHLORIDE 0.9 % IV BOLUS
1000.0000 mL | Freq: Once | INTRAVENOUS | Status: AC
  Administered 2017-11-30: 1000 mL via INTRAVENOUS

## 2017-11-30 MED ORDER — INSULIN ASPART 100 UNIT/ML ~~LOC~~ SOLN
10.0000 [IU] | Freq: Once | SUBCUTANEOUS | Status: AC
  Administered 2017-11-30: 10 [IU] via INTRAVENOUS
  Filled 2017-11-30: qty 1

## 2017-11-30 NOTE — Discharge Instructions (Signed)
Take your insulin as directed.  Do not make any changes to your medications unless directed by your doctor.

## 2017-11-30 NOTE — ED Provider Notes (Signed)
[facility] DEPT Provider Note   CSN: [id number] Arrival date & time: 11/29/17  2214     History   Chief Complaint Chief Complaint  Patient presents with  . Hyperglycemia    HPI [name] is a 47 y.o. male.  Patient presents to the emergency department with a chief complaint of hyperglycemia.  He states that he stopped taking his insulin because of a commercial that he saw on television.  He denies any recent illnesses.  He reports associated polydipsia and polyuria.  Denies any other associated symptoms.  The history is provided by the patient. No language interpreter was used.    Past Medical History:  Diagnosis Date  . Diabetes mellitus type II, uncontrolled ([place]) 07/17/2006  . ESSENTIAL HYPERTENSION 07/17/2006  . HYPERLIPIDEMIA 11/24/2008  . Pneumonia 10/22/2011    Patient Active Problem List   Diagnosis Date Noted  . Healthcare maintenance 07/02/2016  . New left bundle branch block (LBBB)   . 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 Hyperlipidemia LDL goal <70 11/24/2008  . Uncontrolled type 2 diabetes mellitus with peripheral neuropathy ([place]) 07/17/2006    Past Surgical History:  Procedure Laterality Date  . ARM SURGERY          Home Medications    Prior to Admission medications   Medication Sig Start Date End Date Taking? Authorizing Provider  atorvastatin (LIPITOR) 20 MG tablet Take 1 tablet (20 mg total) by mouth daily. 06/10/17   Gildardo Pounds, NP  Blood Glucose Monitoring Suppl (TRUE METRIX METER) w/Device KIT Use as instructed 06/10/17   Gildardo Pounds, NP  cyclobenzaprine (FLEXERIL) 10 MG tablet Take 1 tablet (10 mg total) by mouth 2 (two) times daily as needed for muscle spasms. 02/17/17   Julianne Rice, MD  gabapentin (NEURONTIN) 300 MG capsule Take 1 capsule (300 mg total) by mouth 3 (three) times daily. 06/10/17   Gildardo Pounds, NP  glucose blood test strip Use to check fasting blood sugar once  daily. 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 Insulin dependent 06/10/17   Gildardo Pounds, NP  Insulin Detemir (LEVEMIR) 100 UNIT/ML Pen Inject 60 Units into the skin daily at 10 pm. 09/08/17 10/08/17  Gildardo Pounds, NP  insulin lispro (HUMALOG KWIKPEN) 100 UNIT/ML KiwkPen Inject 0.1 mLs (10 Units total) into the skin 3 (three) times daily. 09/08/17 10/08/17  Gildardo Pounds, NP  Insulin Pen Needle (B-D UF III MINI PEN NEEDLES) 31G X 5 MM MISC Use as instructed 09/08/17   Gildardo Pounds, NP  lisinopril (PRINIVIL,ZESTRIL) 10 MG tablet Take 1 tablet (10 mg total) by mouth daily. 06/10/17   Gildardo Pounds, NP  TRUEPLUS LANCETS 28G MISC Use as instructed 09/08/17   Gildardo Pounds, NP    Family History Family History  Problem Relation Age of Onset  . Hypertension Mother 64  . Cancer Mother        bladder cancer  . Alcohol abuse Father   . Hypertension Brother     Social History Social History   Tobacco Use  . Smoking status: Never Smoker  . Smokeless tobacco: Never Used  Substance Use Topics  . Alcohol use: No    Alcohol/week: 0.0 oz  . Drug use: No     Allergies   Sulfa antibiotics; Bactrim [sulfamethoxazole-trimethoprim]; and Cephalexin   Review of Systems Review of Systems  All other systems reviewed and are negative.    Physical Exam Updated Vital Signs BP (!) 132/92 (BP  Location: Left Arm)   Pulse (!) 103   Temp 98.4 F (36.9 C) (Oral)   Resp 18   SpO2 100%   Physical Exam  Constitutional: He is oriented to person, place, and time. He appears well-developed and well-nourished.  HENT:  Head: Normocephalic and atraumatic.  Eyes: Pupils are equal, round, and reactive to light. Conjunctivae and EOM are normal. Right eye exhibits no discharge. Left eye exhibits no discharge. No scleral icterus.  Neck: Normal range of motion. Neck supple. No JVD present.  Cardiovascular: Normal rate, regular rhythm and normal heart sounds. Exam reveals no gallop and no friction rub.  No murmur  heard. Pulmonary/Chest: Effort normal and breath sounds normal. No respiratory distress. He has no wheezes. He has no rales. He exhibits no tenderness.  Abdominal: Soft. He exhibits no distension and no mass. There is no tenderness. There is no rebound and no guarding.  Musculoskeletal: Normal range of motion. He exhibits no edema or tenderness.  Neurological: He is alert and oriented to person, place, and time.  Skin: Skin is warm and dry.  Psychiatric: He has a normal mood and affect. His behavior is normal. Judgment and thought content normal.  Nursing note and vitals reviewed.    ED Treatments / Results  Labs (all labs ordered are listed, but only abnormal results are displayed) Labs Reviewed  CBG MONITORING, ED - Abnormal; Notable for the following components:      Result Value   Glucose-Capillary 457 (*)    All other components within normal limits  CBC  I-STAT CHEM 8, ED    EKG None  Radiology No results found.  Procedures Procedures (including critical care time)  Medications Ordered in ED Medications  sodium chloride 0.9 % bolus 1,000 mL (has no administration in time range)     Initial Impression / Assessment and Plan / ED Course  I have reviewed the triage vital signs and the nursing notes.  Pertinent labs & imaging results that were available during my care of the patient were reviewed by me and considered in my medical decision making (see chart for details).    Patient with hyperglycemia.  He has been noncompliant in taking his insulin.  Will check labs, give fluids, and reassess.  Glucose is trending down with insulin and fluids.  No evidence of DKA.  Patient is in no acute distress.  We will discharged home.  Urged compliance with his medications.  Final Clinical Impressions(s) / ED Diagnoses   Final diagnoses:  Hyperglycemia    ED Discharge Orders    None       Montine Circle, PA-C 11/30/17 0209    Molpus, Jenny Reichmann, MD 11/30/17 [phone]

## 2018-01-23 ENCOUNTER — Ambulatory Visit (INDEPENDENT_AMBULATORY_CARE_PROVIDER_SITE_OTHER): Payer: Self-pay | Admitting: Family Medicine

## 2018-01-23 ENCOUNTER — Encounter: Payer: Self-pay | Admitting: Family Medicine

## 2018-01-23 VITALS — BP 142/88 | HR 84 | Temp 97.9°F | Ht 67.0 in | Wt 146.0 lb

## 2018-01-23 DIAGNOSIS — E1165 Type 2 diabetes mellitus with hyperglycemia: Secondary | ICD-10-CM

## 2018-01-23 DIAGNOSIS — E1142 Type 2 diabetes mellitus with diabetic polyneuropathy: Secondary | ICD-10-CM

## 2018-01-23 DIAGNOSIS — Z09 Encounter for follow-up examination after completed treatment for conditions other than malignant neoplasm: Secondary | ICD-10-CM

## 2018-01-23 DIAGNOSIS — N529 Male erectile dysfunction, unspecified: Secondary | ICD-10-CM

## 2018-01-23 DIAGNOSIS — R8290 Unspecified abnormal findings in urine: Secondary | ICD-10-CM

## 2018-01-23 DIAGNOSIS — Z23 Encounter for immunization: Secondary | ICD-10-CM

## 2018-01-23 DIAGNOSIS — R7309 Other abnormal glucose: Secondary | ICD-10-CM

## 2018-01-23 DIAGNOSIS — I10 Essential (primary) hypertension: Secondary | ICD-10-CM

## 2018-01-23 DIAGNOSIS — IMO0002 Reserved for concepts with insufficient information to code with codable children: Secondary | ICD-10-CM

## 2018-01-23 LAB — POCT URINALYSIS DIP (MANUAL ENTRY)
Bilirubin, UA: NEGATIVE
Glucose, UA: 500 mg/dL — AB
Ketones, POC UA: NEGATIVE mg/dL
Leukocytes, UA: NEGATIVE
Nitrite, UA: NEGATIVE
Protein Ur, POC: 100 mg/dL — AB
Spec Grav, UA: <=1.005 — AB (ref 1.010–1.025)
Urobilinogen, UA: 0.2 E.U./dL
pH, UA: 5 (ref 5.0–8.0)

## 2018-01-23 LAB — GLUCOSE, POCT (MANUAL RESULT ENTRY): POC Glucose: 348 mg/dl — AB (ref 70–99)

## 2018-01-23 LAB — POCT GLYCOSYLATED HEMOGLOBIN (HGB A1C): Hemoglobin A1C: 14.7 % — AB (ref 4.0–5.6)

## 2018-01-23 MED ORDER — INSULIN LISPRO 100 UNIT/ML ~~LOC~~ SOLN
10.0000 [IU] | Freq: Once | SUBCUTANEOUS | Status: AC
  Administered 2018-01-23: 10 [IU] via SUBCUTANEOUS

## 2018-01-23 MED ORDER — INSULIN LISPRO 100 UNIT/ML (KWIKPEN): 10.0000 [IU] | PEN_INJECTOR | Freq: Three times a day (TID) | SUBCUTANEOUS | 11 refills | Status: DC

## 2018-01-23 MED ORDER — INSULIN DETEMIR 100 UNIT/ML FLEXPEN: 60.0000 [IU] | PEN_INJECTOR | Freq: Every day | SUBCUTANEOUS | 11 refills | Status: DC

## 2018-01-23 MED ORDER — SILDENAFIL CITRATE 25 MG PO TABS: 25.0000 mg | ORAL_TABLET | Freq: Every day | ORAL | 2 refills | Status: DC | PRN

## 2018-01-23 MED FILL — !LEVEMIR FLEXTOUCH 100 UNIT: 100 | 25 days supply | Qty: 15 | Fill #0

## 2018-01-23 MED FILL — !HUMALOG 100 UNITS/ML KWIKP: 100 | 50 days supply | Qty: 15 | Fill #0

## 2018-01-23 MED FILL — !VIAGRA 25 MG TABLET: 25 | 30 days supply | Qty: 10 | Fill #0

## 2018-01-23 NOTE — Patient Instructions (Addendum)
Insulin Lispro injection What is this medicine? INSULIN LISPRO (IN su lin LYE sproe) is a human-made form of insulin. This drug lowers the amount of sugar in your blood. This medicine is a rapid-acting insulin that starts working faster than regular insulin. It will not work as long as regular insulin. This medicine may be used for other purposes; ask your health care provider or pharmacist if you have questions. COMMON BRAND NAME(S): Admelog, Admelog SoloStar, Humalog, Humalog Junior KwikPen, Humalog KwikPen What should I tell my health care provider before I take this medicine? They need to know if you have any of these conditions: -episodes of low blood sugar -kidney disease -liver disease -an unusual or allergic reaction to insulin, metacresol, other medicines, foods, dyes, or preservatives -pregnant or trying to get pregnant -breast-feeding How should I use this medicine? This medicine is for injection under the skin or infusion into a vein. This medicine may be given by health care professional in a hospital or clinic setting. It is important to follow the directions given to you by your health care professional or doctor. You should inject this medicine within 15 minutes before or after your meal. Have food ready before injection. Do not delay eating. You will be taught how to use this medicine and how to adjust doses for activities and illness. Do not use more insulin than prescribed. Do not use more or less often than prescribed. Always check the appearance of your insulin before using it. This medicine should be clear and colorless like water. Do not use it if it is cloudy, thickened, colored, or has solid particles in it. It is important that you put your used needles and syringes in a special sharps container. Do not put them in a trash can. If you do not have a sharps container, call your pharmacist or healthcare provider to get one. Talk to your pediatrician regarding the use of this  medicine in children. Special care may be needed. Overdosage: If you think you have taken too much of this medicine contact a poison control center or emergency room at once. NOTE: This medicine is only for you. Do not share this medicine with others. What if I miss a dose? It is important not to miss a dose. Your health care professional or doctor should discuss a plan for missed doses with you. If you do miss a dose, follow their plan. Do not take double doses. What may interact with this medicine? -other medicines for diabetes Many medications may cause changes in blood sugar, these include: -alcohol containing beverages -antiviral medicines for HIV or AIDS -aspirin and aspirin-like drugs -certain medicines for blood pressure, heart disease, irregular heart beat -chromium -diuretics -male hormones, such as estrogens or progestins, birth control pills -fenofibrate -gemfibrozil -isoniazid -lanreotide -male hormones or anabolic steroids -MAOIs like Carbex, Eldepryl, Marplan, Nardil, and Parnate -medicines for weight loss -medicines for allergies, asthma, cold, or cough -medicines for depression, anxiety, or psychotic disturbances -niacin -nicotine -NSAIDs, medicines for pain and inflammation, like ibuprofen or naproxen -octreotide -pasireotide -pentamidine -phenytoin -probenecid -quinolone antibiotics such as ciprofloxacin, levofloxacin, ofloxacin -some herbal dietary supplements -steroid medicines such as prednisone or cortisone -sulfamethoxazole; trimethoprim -thyroid hormones Some medications can hide the warning symptoms of low blood sugar (hypoglycemia). You may need to monitor your blood sugar more closely if you are taking one of these medications. These include: -beta-blockers, often used for high blood pressure or heart problems (examples include atenolol, metoprolol, propranolol) -clonidine -guanethidine -reserpine This list may not  describe all possible  interactions. Give your health care provider a list of all the medicines, herbs, non-prescription drugs, or dietary supplements you use. Also tell them if you smoke, drink alcohol, or use illegal drugs. Some items may interact with your medicine. What should I watch for while using this medicine? Visit your health care professional or doctor for regular checks on your progress. A test called the HbA1C (A1C) will be monitored. This is a simple blood test. It measures your blood sugar control over the last 2 to 3 months. You will receive this test every 3 to 6 months. Learn how to check your blood sugar. Learn the symptoms of low and high blood sugar and how to manage them. Always carry a quick-source of sugar with you in case you have symptoms of low blood sugar. Examples include hard sugar candy or glucose tablets. Make sure others know that you can choke if you eat or drink when you develop serious symptoms of low blood sugar, such as seizures or unconsciousness. They must get medical help at once. Tell your doctor or health care professional if you have high blood sugar. You might need to change the dose of your medicine. If you are sick or exercising more than usual, you might need to change the dose of your medicine. Do not skip meals. Ask your doctor or health care professional if you should avoid alcohol. Many nonprescription cough and cold products contain sugar or alcohol. These can affect blood sugar. Make sure that you have the right kind of syringe for the type of insulin you use. Try not to change the brand and type of insulin or syringe unless your health care professional or doctor tells you to. Switching insulin brand or type can cause dangerously high or low blood sugar. Always keep an extra supply of insulin, syringes, and needles on hand. Use a syringe one time only. Throw away syringe and needle in a closed container to prevent accidental needle sticks. Insulin pens and cartridges should  never be shared. Even if the needle is changed, sharing may result in passing of viruses like hepatitis or HIV. Wear a medical ID bracelet or chain, and carry a card that describes your disease and details of your medicine and dosage times. What side effects may I notice from receiving this medicine? Side effects that you should report to your doctor or health care professional as soon as possible: -allergic reactions like skin rash, itching or hives, swelling of the face, lips, or tongue -breathing problems -signs and symptoms of high blood sugar such as dizziness, dry mouth, dry skin, fruity breath, nausea, stomach pain, increased hunger or thirst, increased urination -signs and symptoms of low blood sugar such as feeling anxious, confusion, dizziness, increased hunger, unusually weak or tired, sweating, shakiness, cold, irritable, headache, blurred vision, fast heartbeat, loss of consciousness Side effects that usually do not require medical attention (report to your doctor or health care professional if they continue or are bothersome): -increase or decrease in fatty tissue under the skin due to overuse of a particular injection site -itching, burning, swelling, or rash at site where injected This list may not describe all possible side effects. Call your doctor for medical advice about side effects. You may report side effects to FDA at 1-800-FDA-1088. Where should I keep my medicine? Keep out of the reach of children. Store unopened insulin vials in a refrigerator between 2 and 8 degrees C (36 and 46 degrees F). Do not freeze or  use if the insulin has been frozen. Opened vials (vials currently in use) may be stored in the refrigerator or at room temperature, at approximately 30 degrees C (86 degrees F) or cooler. Keeping your insulin at room temperature decreases the amount of pain during injection. Once opened, your insulin can be used for 28 days. After 28 days, the vial of insulin should be  thrown away. Store unopened cartridges or disposable pens in a refrigerator between 2 and 8 degrees C (36 and 46 degrees F.) Do not freeze or use if the insulin has been frozen. Once opened, the disposable pens and cartridges that are inserted into pens should be kept at room temperature, approximately 30 degrees C (80 degrees F) or cooler. Do not store in the refrigerator. Once opened, the insulin can be used for 28 days. After 28 days, the cartridge or disposable pen should be thrown away. Protect from light and excessive heat. Throw away any unused medicine after the expiration date or after the specified time for room temperature storage has passed. NOTE: This sheet is a summary. It may not cover all possible information. If you have questions about this medicine, talk to your doctor, pharmacist, or health care provider.  2018 [employer] (2015-11-28 14:24:09)       Insulin Detemir injection What is this medicine? INSULIN DETEMIR (IN su lin DE te mir) is a human-made form of insulin. This drug lowers the amount of sugar in your blood. It is a long-acting insulin that is usually given once or twice a day. This medicine may be used for other purposes; ask your health care provider or pharmacist if you have questions. COMMON BRAND NAME(S): Levemir, Levemir FlexTouch What should I tell my health care provider before I take this medicine? They need to know if you have any of these conditions: -episodes of low blood sugar -kidney disease -liver disease -an unusual or allergic reaction to insulin, metacresol, other medicines, foods, dyes, or preservatives -pregnant or trying to get pregnant -breast-feeding How should I use this medicine? This medicine is for injection under the skin. Take this medicine at the same time(s) each day. Use exactly as directed. This insulin should never be mixed in the same syringe with other insulins before injection. Do not vigorously shake insulin  before use. You will be taught how to adjust doses for activities and illness. Do not use more insulin than prescribed. Do not use more or less often than prescribed. Always check the appearance of your insulin before using it. This medicine should be clear and colorless like water. Do not use if it is cloudy, thickened, colored, or has solid particles in it. It is important that you put your used needles and syringes in a special sharps container. Do not put them in a trash can. If you do not have a sharps container, call your pharmacist or healthcare provider to get one. Talk to your pediatrician regarding the use of this medicine in children. While this drug may be prescribed for children as young as 2 years for selected conditions, precautions do apply. Overdosage: If you think you have taken too much of this medicine contact a poison control center or emergency room at once. NOTE: This medicine is only for you. Do not share this medicine with others. What if I miss a dose? It is important not to miss a dose. Your health care professional or doctor should discuss a plan for missed doses with you. If you do miss a dose,  follow their plan. Do not take double doses. What may interact with this medicine? -other medicines for diabetes Many medications may cause changes in blood sugar, these include: -alcohol containing beverages -antiviral medicines for HIV or AIDS -aspirin and aspirin-like drugs -certain medicines for blood pressure, heart disease, irregular heart beat -chromium -diuretics -male hormones, such as estrogens or progestins, birth control pills -fenofibrate -gemfibrozil -isoniazid -lanreotide -male hormones or anabolic steroids -MAOIs like Carbex, Eldepryl, Marplan, Nardil, and Parnate -medicines for weight loss -medicines for allergies, asthma, cold, or cough -medicines for depression, anxiety, or psychotic disturbances -niacin -nicotine -NSAIDs, medicines for pain and  inflammation, like ibuprofen or naproxen -octreotide -pasireotide -pentamidine -phenytoin -probenecid -quinolone antibiotics such as ciprofloxacin, levofloxacin, ofloxacin -some herbal dietary supplements -steroid medicines such as prednisone or cortisone -sulfamethoxazole; trimethoprim -thyroid hormones Some medications can hide the warning symptoms of low blood sugar (hypoglycemia). You may need to monitor your blood sugar more closely if you are taking one of these medications. These include: -beta-blockers, often used for high blood pressure or heart problems (examples include atenolol, metoprolol, propranolol) -clonidine -guanethidine -reserpine This list may not describe all possible interactions. Give your health care provider a list of all the medicines, herbs, non-prescription drugs, or dietary supplements you use. Also tell them if you smoke, drink alcohol, or use illegal drugs. Some items may interact with your medicine. What should I watch for while using this medicine? Visit your health care professional or doctor for regular checks on your progress. A test called the HbA1C (A1C) will be monitored. This is a simple blood test. It measures your blood sugar control over the last 2 to 3 months. You will receive this test every 3 to 6 months. Learn how to check your blood sugar. Learn the symptoms of low and high blood sugar and how to manage them. Always carry a quick-source of sugar with you in case you have symptoms of low blood sugar. Examples include hard sugar candy or glucose tablets. Make sure others know that you can choke if you eat or drink when you develop serious symptoms of low blood sugar, such as seizures or unconsciousness. They must get medical help at once. Tell your doctor or health care professional if you have high blood sugar. You might need to change the dose of your medicine. If you are sick or exercising more than usual, you might need to change the dose of  your medicine. Do not skip meals. Ask your doctor or health care professional if you should avoid alcohol. Many nonprescription cough and cold products contain sugar or alcohol. These can affect blood sugar. Make sure that you have the right kind of syringe for the type of insulin you use. Try not to change the brand and type of insulin or syringe unless your health care professional or doctor tells you to. Switching insulin brand or type can cause dangerously high or low blood sugar. Always keep an extra supply of insulin, syringes, and needles on hand. Use a syringe one time only. Throw away syringe and needle in a closed container to prevent accidental needle sticks. Insulin pens and cartridges should never be shared. Even if the needle is changed, sharing may result in passing of viruses like hepatitis or HIV. Wear a medical ID bracelet or chain, and carry a card that describes your disease and details of your medicine and dosage times. What side effects may I notice from receiving this medicine? Side effects that you should report to your doctor or  health care professional as soon as possible: -allergic reactions like skin rash, itching or hives, swelling of the face, lips, or tongue -breathing problems -signs and symptoms of high blood sugar such as dizziness, dry mouth, dry skin, fruity breath, nausea, stomach pain, increased hunger or thirst, increased urination -signs and symptoms of low blood sugar such as feeling anxious, confusion, dizziness, increased hunger, unusually weak or tired, sweating, shakiness, cold, irritable, headache, blurred vision, fast heartbeat, loss of consciousness Side effects that usually do not require medical attention (report to your doctor or health care professional if they continue or are bothersome): -increase or decrease in fatty tissue under the skin due to overuse of a particular injection site -itching, burning, swelling, or rash at site where injected This  list may not describe all possible side effects. Call your doctor for medical advice about side effects. You may report side effects to FDA at 1-800-FDA-1088. Where should I keep my medicine? Keep out of the reach of children. Store [employer] in a refrigerator between 2 and 8 degrees C (36 and 46 degrees F.) Do not freeze or use if the insulin has been frozen. Once opened, the pens should be kept at room temperature, below 30 degrees C (86 degrees F). Do not store in the refrigerator once opened. Once opened, the insulin can be used for 42 days. After 42 days, the Flextouch Pen should be thrown away. Store unopened insulin vials in a refrigerator between 2 and 8 degrees C (36 and 46 degrees F). Do not freeze or use if the insulin has been frozen. Opened vials (vials currently in use) should be stored in a refrigerator, never a freezer. If refrigeration is not possible, the opened vial can be stored unrefrigerated at room temperature, below 30 degrees C (86 degrees F) for up to 42 days. After 42 days, the vial of insulin should be thrown away. Keeping your insulin at room temperature decreases the amount of pain during injection. Protect from light and excessive heat. Throw away any unused medicine after the expiration date or after the specified time for room temperature storage has passed. NOTE: This sheet is a summary. It may not cover all possible information. If you have questions about this medicine, talk to your doctor, pharmacist, or health care provider.  2018 [employer] Standard (2015-11-28 14:20:00)       Sildenafil tablets (Viagra) What is this medicine? SILDENAFIL (sil DEN a fil) is used to treat erection problems in men. This medicine may be used for other purposes; ask your health care provider or pharmacist if you have questions. COMMON BRAND NAME(S): Viagra What should I tell my health care provider before I take this medicine? They need to know if you have any of these  conditions: -bleeding disorders -eye or vision problems, including a rare inherited eye disease called retinitis pigmentosa -anatomical deformation of the penis, Peyronie's disease, or history of priapism (painful and prolonged erection) -heart disease, angina, a history of heart attack, irregular heart beats, or other heart problems -high or low blood pressure -history of blood diseases, like sickle cell anemia or leukemia -history of stomach bleeding -kidney disease -liver disease -stroke -an unusual or allergic reaction to sildenafil, other medicines, foods, dyes, or preservatives -pregnant or trying to get pregnant -breast-feeding How should I use this medicine? Take this medicine by mouth with a glass of water. Follow the directions on the prescription label. The dose is usually taken 1 hour before sexual activity. You should not take the  dose more than once per day. Do not take your medicine more often than directed. Talk to your pediatrician regarding the use of this medicine in children. This medicine is not used in children for this condition. Overdosage: If you think you have taken too much of this medicine contact a poison control center or emergency room at once. NOTE: This medicine is only for you. Do not share this medicine with others. What if I miss a dose? This does not apply. Do not take double or extra doses. What may interact with this medicine? Do not take this medicine with any of the following medications: -cisapride -nitrates like amyl nitrite, isosorbide dinitrate, isosorbide mononitrate, nitroglycerin -riociguat This medicine may also interact with the following medications: -antiviral medicines for HIV or AIDS -bosentan -certain medicines for benign prostatic hyperplasia (BPH) -certain medicines for blood pressure -certain medicines for fungal infections like ketoconazole and itraconazole -cimetidine -erythromycin -rifampin This list may not describe all  possible interactions. Give your health care provider a list of all the medicines, herbs, non-prescription drugs, or dietary supplements you use. Also tell them if you smoke, drink alcohol, or use illegal drugs. Some items may interact with your medicine. What should I watch for while using this medicine? If you notice any changes in your vision while taking this drug, call your doctor or health care professional as soon as possible. Stop using this medicine and call your health care provider right away if you have a loss of sight in one or both eyes. Contact your doctor or health care professional right away if you have an erection that lasts longer than 4 hours or if it becomes painful. This may be a sign of a serious problem and must be treated right away to prevent permanent damage. If you experience symptoms of nausea, dizziness, chest pain or arm pain upon initiation of sexual activity after taking this medicine, you should refrain from further activity and call your doctor or health care professional as soon as possible. Do not drink alcohol to excess (examples, 5 glasses of wine or 5 shots of whiskey) when taking this medicine. When taken in excess, alcohol can increase your chances of getting a headache or getting dizzy, increasing your heart rate or lowering your blood pressure. Using this medicine does not protect you or your partner against HIV infection (the virus that causes AIDS) or other sexually transmitted diseases. What side effects may I notice from receiving this medicine? Side effects that you should report to your doctor or health care professional as soon as possible: -allergic reactions like skin rash, itching or hives, swelling of the face, lips, or tongue -breathing problems -changes in hearing -changes in vision -chest pain -fast, irregular heartbeat -prolonged or painful erection -seizures Side effects that usually do not require medical attention (report to your doctor  or health care professional if they continue or are bothersome): -back pain -dizziness -flushing -headache -indigestion -muscle aches -nausea -stuffy or runny nose This list may not describe all possible side effects. Call your doctor for medical advice about side effects. You may report side effects to FDA at 1-800-FDA-1088. Where should I keep my medicine? Keep out of reach of children. Store at room temperature between 15 and 30 degrees C (59 and 86 degrees F). Throw away any unused medicine after the expiration date. NOTE: This sheet is a summary. It may not cover all possible information. If you have questions about this medicine, talk to your doctor, pharmacist, or health care  provider.  2018 [employer] Standard (2015-04-19 12:00:25)

## 2018-01-23 NOTE — Progress Notes (Signed)
New Patient---Establish Care  Subjective:    Patient ID: [name], male    DOB: 1970-05-30, 47 y.o.   MRN: [id number]   Chief Complaint  Patient presents with  . Establish Care   HPI  [name] is a 47 year old male with a past medical history of Pneumonia, Hyperlipidemia, Hypertension, and Diabetes. He is here today to Establish Care.   Current Status: He is doing well, but has not been taking medications as prescribed for many months. He has erectile dysfunction because of medications. He has c/o frequent urination. He denies increased thirst, hunger, fatigue, blurred vision, excessive hunger, excessive thirst, weight gain, weight loss, and poor wound healing. He denies visual changes, shortness of breath, chest pain, heart palpitations, and falls. He has occasionally headaches and dizziness with position changes. Denies severe headaches, confusion, seizures, double vision, and blurred vision, nausea and vomiting.  He denies fevers, chills, fatigue, recent infections, weight loss, and night sweats. No reports of GI problems such as diarrhea, and constipation. He has no reports of blood in stools, dysuria and hematuria. No depression or anxiety, and denies suicidal ideations, homicidal ideations, or auditory hallucinations. He denies pain today.   Past Medical History:  Diagnosis Date  . Diabetes mellitus type II, uncontrolled ([place]) 07/17/2006  . ESSENTIAL HYPERTENSION 07/17/2006  . HYPERLIPIDEMIA 11/24/2008  . Pneumonia 10/22/2011    Family History  Problem Relation Age of Onset  . Hypertension Mother 67  . Cancer Mother        bladder cancer  . Alcohol abuse Father   . Hypertension Brother     Social History   Socioeconomic History  . Marital status: Married    Spouse name: Not on file  . Number of children: Not on file  . Years of education: Not on file  . Highest education level: Not on file  Occupational History  . 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  Occupation: Biochemist, clinical    Comment: Updated June 2013  . Occupation: Engineer, civil (consulting)    Comment: Began Jan 2014  Social Needs  . Financial resource strain: Not on file  . Food insecurity:    Worry: Not on file    Inability: Not on file  . Transportation needs:    Medical: Not on file    Non-medical: Not on file  Tobacco Use  . Smoking status: Never Smoker  . Smokeless tobacco: Never Used  Substance and Sexual Activity  . Alcohol use: No    Alcohol/week: 0.0 standard drinks  . Drug use: No  . Sexual activity: Yes    Partners: Female  Lifestyle  . Physical activity:    Days per week: Not on file    Minutes per session: Not on file  . Stress: Not on file  Relationships  . Social connections:    Talks on phone: Not on file    Gets together: Not on file    Attends religious service: Not on file    Active member of club or organization: Not on file    Attends meetings of clubs or organizations: Not on file    Relationship status: Not on file  . Intimate partner violence:    Fear of current or ex partner: Not on file    Emotionally abused: Not on file    Physically abused: Not on file    Forced sexual activity: Not on file  Other Topics Concern  . Not on file  Social History  Narrative   Lives with his mother and brother.   Wife has had a foot amputation; is a type 1 DM.    Past Surgical History:  Procedure Laterality Date  . ARM SURGERY     Immunization History  Administered Date(s) Administered  . Influenza,inj,Quad PF,6+ Mos 05/31/2013, 07/02/2016, 01/23/2018  . Pneumococcal Polysaccharide-23 06/16/2008    Current Meds  Medication Sig  . atorvastatin (LIPITOR) 20 MG tablet Take 1 tablet (20 mg total) by mouth daily.  . Blood Glucose Monitoring Suppl (TRUE METRIX METER) w/Device KIT Use as instructed  . cyclobenzaprine (FLEXERIL) 10 MG tablet Take 1 tablet (10 mg total) by mouth 2 (two) times daily as needed for muscle spasms.  Marland Kitchen gabapentin (NEURONTIN) 300 MG  capsule Take 1 capsule (300 mg total) by mouth 3 (three) times daily.  Marland Kitchen glucose blood test strip Use to check fasting blood sugar once daily. 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 Insulin dependent  . Insulin Pen Needle (B-D UF III MINI PEN NEEDLES) 31G X 5 MM MISC Use as instructed  . lisinopril (PRINIVIL,ZESTRIL) 10 MG tablet Take 1 tablet (10 mg total) by mouth daily.  . TRUEPLUS LANCETS 28G MISC Use as instructed    Allergies  Allergen Reactions  . Sulfa Antibiotics Rash    Severe rash. Do not give sulfa medications  . Bactrim [Sulfamethoxazole-Trimethoprim]     Rash  . Cephalexin     Patient with severe drug reaction including exfoliating skin rash and hypotension after being prescribed both cephalexin and sulfamethoxazole-trimethoprim simultaneously. Favor SMX as much more likely culprit as patient had tolerated other cephalosporins in the past, but cannot say with complete certainty that this was not related to cephalexin.     BP (!) 142/88 (BP Location: Left Arm, Patient Position: Sitting, Cuff Size: Small)   Pulse 84   Temp 97.9 F (36.6 C) (Oral)   Ht 5' 7"  (1.702 m)   Wt 146 lb (66.2 kg)   SpO2 96%   BMI 22.87 kg/m     Review of Systems  Constitutional: Negative.   HENT: Negative.   Eyes: Negative.   Respiratory: Negative.   Cardiovascular: Negative.   Gastrointestinal: Negative.   Endocrine: Negative.   Genitourinary: Negative.   Musculoskeletal: Negative.   Skin: Negative.   Allergic/Immunologic: Negative.   Neurological: Negative.   Hematological: Negative.   Psychiatric/Behavioral: Negative.     Objective:   Physical Exam  Constitutional: He is oriented to person, place, and time. He appears well-developed and well-nourished.  HENT:  Head: Normocephalic and atraumatic.  Right Ear: External ear normal.  Left Ear: External ear normal.  Nose: Nose normal.  Mouth/Throat: Oropharynx is clear and moist.  Eyes: Pupils are equal, round, and reactive to light. Conjunctivae  and EOM are normal.  Neck: Normal range of motion. Neck supple.  Cardiovascular: Normal rate, regular rhythm, normal heart sounds and intact distal pulses.  Pulmonary/Chest: Effort normal and breath sounds normal.  Abdominal: Soft. Bowel sounds are normal.  Musculoskeletal: Normal range of motion.  Neurological: He is alert and oriented to person, place, and time.  Skin: Capillary refill takes less than 2 seconds.  Psychiatric: He has a normal mood and affect. His behavior is normal. Judgment and thought content normal.  Nursing note and vitals reviewed.  Assessment & Plan:   1. Uncontrolled type 2 diabetes mellitus with peripheral neuropathy ([place])  He will restart taking Gabapentin today.  - POCT glycosylated hemoglobin (Hb A1C) - POCT urinalysis dipstick - POCT glucose (manual entry) -  Urine Culture - Insulin Detemir (LEVEMIR) 100 UNIT/ML Pen; Inject 60 Units into the skin daily at 10 pm.  Dispense: 15 mL; Refill: 11 - insulin lispro (HUMALOG KWIKPEN) 100 UNIT/ML KiwkPen; Inject 0.1 mLs (10 Units total) into the skin 3 (three) times daily.  Dispense: 15 mL; Refill: 11  2. Need for immunization against influenza - Flu Vaccine QUAD 36+ mos IM  3. Elevated glucose Blood glucose is elevated at 348 today. Hgb A1c is elevated at 14.7 today. He will began taking Lantus 60 units QHS and Novolog 10 units TID, as prescribed. He will continue to decrease foods/beverages high in sugars and carbs and follow Heart Healthy or DASH diet. Increase physical activity to at least 30 minutes cardio exercise daily.   - insulin lispro (HUMALOG) injection 10 Units - Urine Culture  4. Abnormal urinalysis - Urine Culture  5. Erectile dysfunction, unspecified erectile dysfunction type We will initiate Sidenafil today. Precautions of adverse side affects reviewed with patient. Verbalized understanding.  - sildenafil (VIAGRA) 25 MG tablet; Take 1 tablet (25 mg total) by mouth daily as needed for erectile  dysfunction.  Dispense: 10 tablet; Refill: 2  6. Essential hypertension Blood pressure id 142/88 today. Continue Lisinopril as prescribed. He will continue to decrease high sodium intake, excessive alcohol intake, increase potassium intake, smoking cessation, and increase physical activity of at least 30 minutes of cardio activity daily. He will continue to follow Heart Healthy or DASH diet.  7. Follow up He will follow up in 2 weeks.   Meds ordered this encounter  Medications  . insulin lispro (HUMALOG) injection 10 Units  . Insulin Detemir (LEVEMIR) 100 UNIT/ML Pen    Sig: Inject 60 Units into the skin daily at 10 pm.    Dispense:  15 mL    Refill:  11  . insulin lispro (HUMALOG KWIKPEN) 100 UNIT/ML KiwkPen    Sig: Inject 0.1 mLs (10 Units total) into the skin 3 (three) times daily.    Dispense:  15 mL    Refill:  11  . DISCONTD: sildenafil (VIAGRA) 25 MG tablet    Sig: Take 1 tablet (25 mg total) by mouth daily as needed for erectile dysfunction.    Dispense:  10 tablet    Refill:  2  . sildenafil (VIAGRA) 25 MG tablet    Sig: Take 1 tablet (25 mg total) by mouth daily as needed for erectile dysfunction.    Dispense:  10 tablet    Refill:  [place],  MSN, FNP-C [facility] [address] [place], [place] [address] [phone]

## 2018-01-24 MED ORDER — SILDENAFIL CITRATE 25 MG PO TABS: 25.0000 mg | ORAL_TABLET | Freq: Every day | ORAL | 2 refills | Status: DC | PRN

## 2018-01-24 MED ORDER — GABAPENTIN 300 MG PO CAPS: 300.0000 mg | ORAL_CAPSULE | Freq: Three times a day (TID) | ORAL | 3 refills | Status: DC

## 2018-01-24 NOTE — Addendum Note (Signed)
Addended by: [name] on: 01/24/2018 08:17 PM   Modules accepted: Level of Service

## 2018-01-25 LAB — URINE CULTURE

## 2018-02-06 ENCOUNTER — Ambulatory Visit: Payer: Self-pay | Admitting: Family Medicine

## 2018-02-27 ENCOUNTER — Ambulatory Visit: Payer: Self-pay | Admitting: Family Medicine

## 2018-03-12 ENCOUNTER — Encounter (HOSPITAL_COMMUNITY): Payer: Self-pay

## 2018-03-12 ENCOUNTER — Other Ambulatory Visit: Payer: Self-pay

## 2018-03-12 ENCOUNTER — Emergency Department (HOSPITAL_COMMUNITY)
Admission: EM | Admit: 2018-03-12 | Discharge: 2018-03-13 | Disposition: A | Discharge status: Home / Self Care | Payer: Self-pay | Attending: Emergency Medicine | Admitting: Emergency Medicine

## 2018-03-12 DIAGNOSIS — I10 Essential (primary) hypertension: Secondary | ICD-10-CM | POA: Insufficient documentation

## 2018-03-12 DIAGNOSIS — Z79899 Other long term (current) drug therapy: Secondary | ICD-10-CM | POA: Insufficient documentation

## 2018-03-12 DIAGNOSIS — Z794 Long term (current) use of insulin: Secondary | ICD-10-CM | POA: Insufficient documentation

## 2018-03-12 DIAGNOSIS — R739 Hyperglycemia, unspecified: Secondary | ICD-10-CM

## 2018-03-12 DIAGNOSIS — E1165 Type 2 diabetes mellitus with hyperglycemia: Secondary | ICD-10-CM | POA: Insufficient documentation

## 2018-03-12 LAB — CBC
HCT: 37.1 % — ABNORMAL LOW (ref 39.0–52.0)
Hemoglobin: 11.5 g/dL — ABNORMAL LOW (ref 13.0–17.0)
MCH: 26.7 pg (ref 26.0–34.0)
MCHC: 31 g/dL (ref 30.0–36.0)
MCV: 86.1 fL (ref 80.0–100.0)
Platelets: 289 10*3/uL (ref 150–400)
RBC: 4.31 MIL/uL (ref 4.22–5.81)
RDW: 12.3 % (ref 11.5–15.5)
WBC: 6.2 10*3/uL (ref 4.0–10.5)
nRBC: 0 % (ref 0.0–0.2)

## 2018-03-12 LAB — URINALYSIS, ROUTINE W REFLEX MICROSCOPIC
Bacteria, UA: NONE SEEN
Bilirubin Urine: NEGATIVE
Glucose, UA: >=500 mg/dL — AB
Hgb urine dipstick: NEGATIVE
Ketones, ur: NEGATIVE mg/dL
Leukocytes, UA: NEGATIVE
Nitrite: NEGATIVE
Protein, ur: 100 mg/dL — AB
Specific Gravity, Urine: 1.031 — ABNORMAL HIGH (ref 1.005–1.030)
pH: 5 (ref 5.0–8.0)

## 2018-03-12 LAB — CBG MONITORING, ED: Glucose-Capillary: 305 mg/dL — ABNORMAL HIGH (ref 70–99)

## 2018-03-12 LAB — BASIC METABOLIC PANEL
Anion gap: 10 (ref 5–15)
BUN: 17 mg/dL (ref 6–20)
CO2: 26 mmol/L (ref 22–32)
Calcium: 8.6 mg/dL — ABNORMAL LOW (ref 8.9–10.3)
Chloride: 100 mmol/L (ref 98–111)
Creatinine, Ser: 1.11 mg/dL (ref 0.61–1.24)
GFR calc Af Amer: >60 mL/min (ref >60)
GFR calc non Af Amer: >60 mL/min (ref >60)
Glucose, Bld: 324 mg/dL — ABNORMAL HIGH (ref 70–99)
Potassium: 3.8 mmol/L (ref 3.5–5.1)
Sodium: 136 mmol/L (ref 135–145)

## 2018-03-12 NOTE — ED Triage Notes (Signed)
Pt comes in for urinary frequency, no pain with urination.  Hx of diabetes takes insulin for such and has not taken any today. A&Ox4 ambulatory to triage.

## 2018-03-13 LAB — CBG MONITORING, ED
Glucose-Capillary: 246 mg/dL — ABNORMAL HIGH (ref 70–99)
Glucose-Capillary: 290 mg/dL — ABNORMAL HIGH (ref 70–99)

## 2018-03-13 NOTE — ED Provider Notes (Signed)
[facility] EMERGENCY DEPARTMENT Provider Note   CSN: [id number] Arrival date & time: 03/12/18  2152     History   Chief Complaint Chief Complaint  Patient presents with  . Hyperglycemia    HPI Josip R Felty is a 47 y.o. male.  Patient with history of diabetes and hypertension presenting with elevated blood sugar.  States he has been urinating frequently at home and he checked his sugar and was over 302 became concerned.  He states he has not had his insulin for 3 days as it was locked up with a family member but he is going to get it tomorrow.  He endorses increased frequency but no pain with urination, no hematuria no dysuria.  No abdominal pain, fever, chills, nausea or vomiting.  The history is provided by the patient.  Hyperglycemia  Associated symptoms: fatigue   Associated symptoms: no abdominal pain, no chest pain, no dizziness, no fever, no nausea, no shortness of breath, no vomiting and no weakness     Past Medical History:  Diagnosis Date  . Diabetes mellitus type II, uncontrolled ([facility]) 07/17/2006  . ESSENTIAL HYPERTENSION 07/17/2006  . HYPERLIPIDEMIA 11/24/2008  . Pneumonia 10/22/2011    Patient Active Problem List   Diagnosis Date Noted  . Healthcare maintenance 07/02/2016  . New left bundle branch block (LBBB)   . 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 Hyperlipidemia LDL goal <70 11/24/2008  . Uncontrolled type 2 diabetes mellitus with peripheral neuropathy ([facility]) 07/17/2006    Past Surgical History:  Procedure Laterality Date  . ARM SURGERY          Home Medications    Prior to Admission medications   Medication Sig Start Date End Date Taking? Authorizing Provider  atorvastatin (LIPITOR) 20 MG tablet Take 1 tablet (20 mg total) by mouth daily. 06/10/17   Fleming, Zelda W, NP  Blood Glucose Monitoring Suppl (TRUE METRIX METER) w/Device KIT Use as instructed 06/10/17   Fleming, Zelda W, NP  cyclobenzaprine (FLEXERIL) 10  MG tablet Take 1 tablet (10 mg total) by mouth 2 (two) times daily as needed for muscle spasms. 02/17/17   Yelverton, David, MD  gabapentin (NEURONTIN) 300 MG capsule Take 1 capsule (300 mg total) by mouth 3 (three) times daily. 01/24/18   Stroud, Natalie M, FNP  glucose blood test strip Use to check fasting blood sugar once daily. 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 Insulin dependent 06/10/17   Fleming, Zelda W, NP  Insulin Detemir (LEVEMIR) 100 UNIT/ML Pen Inject 60 Units into the skin daily at 10 pm. 01/23/18 02/22/18  Stroud, Natalie M, FNP  insulin lispro (HUMALOG KWIKPEN) 100 UNIT/ML KiwkPen Inject 0.1 mLs (10 Units total) into the skin 3 (three) times daily. 01/23/18 02/22/18  Stroud, Natalie M, FNP  Insulin Pen Needle (B-D UF III MINI PEN NEEDLES) 31G X 5 MM MISC Use as instructed 09/08/17   Fleming, Zelda W, NP  lisinopril (PRINIVIL,ZESTRIL) 10 MG tablet Take 1 tablet (10 mg total) by mouth daily. 06/10/17   Fleming, Zelda W, NP  sildenafil (VIAGRA) 25 MG tablet Take 1 tablet (25 mg total) by mouth daily as needed for erectile dysfunction. 01/24/18   Stroud, Natalie M, FNP  TRUEPLUS LANCETS 28G MISC Use as instructed 09/08/17   Fleming, Zelda W, NP    Family History Family History  Problem Relation Age of Onset  . Hypertension Mother 55  . Cancer Mother        bladder cancer  . Alcohol abuse Father   .   Hypertension Brother     Social History Social History   Tobacco Use  . Smoking status: Never Smoker  . Smokeless tobacco: Never Used  Substance Use Topics  . Alcohol use: No    Alcohol/week: 0.0 standard drinks  . Drug use: No     Allergies   Sulfa antibiotics; Bactrim [sulfamethoxazole-trimethoprim]; and Cephalexin   Review of Systems Review of Systems  Constitutional: Positive for fatigue. Negative for activity change, appetite change and fever.  HENT: Negative for congestion, rhinorrhea and sinus pain.   Eyes: Negative for visual disturbance.  Respiratory: Negative for cough, chest tightness and  shortness of breath.   Cardiovascular: Negative for chest pain.  Gastrointestinal: Negative for abdominal pain, nausea and vomiting.  Genitourinary: Positive for frequency and urgency. Negative for flank pain.  Musculoskeletal: Negative for arthralgias and myalgias.  Neurological: Negative for dizziness, weakness and headaches.   all other systems are negative except as noted in the HPI and PMH.     Physical Exam Updated Vital Signs BP 140/86   Pulse 83   Temp 98.2 F (36.8 C) (Oral)   Resp 17   SpO2 99%   Physical Exam  Constitutional: He is oriented to person, place, and time. He appears well-developed and well-nourished. No distress.  HENT:  Head: Normocephalic and atraumatic.  Mouth/Throat: Oropharynx is clear and moist. No oropharyngeal exudate.  Eyes: Pupils are equal, round, and reactive to light. Conjunctivae and EOM are normal.  Neck: Normal range of motion. Neck supple.  No meningismus.  Cardiovascular: Normal rate, regular rhythm, normal heart sounds and intact distal pulses.  No murmur heard. Pulmonary/Chest: Effort normal and breath sounds normal. No respiratory distress. He exhibits no tenderness.  Abdominal: Soft. There is no tenderness. There is no rebound and no guarding.  Musculoskeletal: Normal range of motion. He exhibits no edema or tenderness.  Neurological: He is alert and oriented to person, place, and time. No cranial nerve deficit. He exhibits normal muscle tone. Coordination normal.  No ataxia on finger to nose bilaterally. No pronator drift. 5/5 strength throughout. CN 2-12 intact.Equal grip strength. Sensation intact.   Skin: Skin is warm. Capillary refill takes less than 2 seconds.  Psychiatric: He has a normal mood and affect. His behavior is normal.  Nursing note and vitals reviewed.    ED Treatments / Results  Labs (all labs ordered are listed, but only abnormal results are displayed) Labs Reviewed  BASIC METABOLIC PANEL - Abnormal; Notable  for the following components:      Result Value   Glucose, Bld 324 (*)    Calcium 8.6 (*)    All other components within normal limits  CBC - Abnormal; Notable for the following components:   Hemoglobin 11.5 (*)    HCT 37.1 (*)    All other components within normal limits  URINALYSIS, ROUTINE W REFLEX MICROSCOPIC - Abnormal; Notable for the following components:   Specific Gravity, Urine 1.031 (*)    Glucose, UA >=500 (*)    Protein, ur 100 (*)    All other components within normal limits  CBG MONITORING, ED - Abnormal; Notable for the following components:   Glucose-Capillary 305 (*)    All other components within normal limits  CBG MONITORING, ED - Abnormal; Notable for the following components:   Glucose-Capillary 246 (*)    All other components within normal limits  CBG MONITORING, ED    EKG None  Radiology No results found.  Procedures Procedures (including critical care time)  Medications Ordered in ED Medications - No data to display   Initial Impression / Assessment and Plan / ED Course  I have reviewed the triage vital signs and the nursing notes.  Pertinent labs & imaging results that were available during my care of the patient were reviewed by me and considered in my medical decision making (see chart for details).    Hyperglycemia  Likely due to noncompliance. Vital stable.  Patient tolerating p.o.  His labs show no evidence of DKA.  Anion gap negative.  No evidence of DKA. Oral hydration given.  Patient states he will get his insulin tomorrow and does not need prescriptions. Follow up with PCP. Reinforced compliance.  Return precautions discussed.    Final Clinical Impressions(s) / ED Diagnoses   Final diagnoses:  Hyperglycemia    ED Discharge Orders    None       Promyse Ardito, Annie Main, MD 03/13/18 [phone]

## 2018-03-13 NOTE — Discharge Instructions (Addendum)
Take your insulin as prescribed and follow-up with your doctor.  Return to the ED if you develop new or worsening symptoms.

## 2018-03-13 NOTE — ED Notes (Signed)
Pt CBG was 290, notified Ben(RN)

## 2018-03-13 NOTE — ED Notes (Signed)
Patient verbalizes understanding of discharge instructions. Opportunity for questioning and answers were provided. Armband removed by staff, pt discharged from ED ambulatory.   

## 2018-04-15 ENCOUNTER — Ambulatory Visit: Payer: Self-pay | Admitting: Family Medicine

## 2018-04-28 ENCOUNTER — Ambulatory Visit: Payer: Self-pay | Admitting: Family Medicine

## 2018-05-25 ENCOUNTER — Emergency Department (HOSPITAL_COMMUNITY)
Admission: EM | Admit: 2018-05-25 | Discharge: 2018-05-26 | Disposition: A | Discharge status: Home / Self Care | Payer: Self-pay | Attending: Emergency Medicine | Admitting: Emergency Medicine

## 2018-05-25 ENCOUNTER — Encounter (HOSPITAL_COMMUNITY): Payer: Self-pay

## 2018-05-25 ENCOUNTER — Other Ambulatory Visit: Payer: Self-pay

## 2018-05-25 DIAGNOSIS — R739 Hyperglycemia, unspecified: Secondary | ICD-10-CM

## 2018-05-25 DIAGNOSIS — Z79899 Other long term (current) drug therapy: Secondary | ICD-10-CM | POA: Insufficient documentation

## 2018-05-25 DIAGNOSIS — E11649 Type 2 diabetes mellitus with hypoglycemia without coma: Secondary | ICD-10-CM | POA: Insufficient documentation

## 2018-05-25 DIAGNOSIS — Z794 Long term (current) use of insulin: Secondary | ICD-10-CM | POA: Insufficient documentation

## 2018-05-25 NOTE — ED Notes (Signed)
CBG 497

## 2018-05-25 NOTE — ED Triage Notes (Signed)
Pt arrives POV for eval of polyuria, polydipsia and dry mouth x 3 days. States he "wanted to get checked out" because he hasnt been feeling well and knows his sugars have been running high. CBG 497 in triage. Denies any other acute complaints, denies N/V/D, abd pain.

## 2018-05-26 LAB — CBC
HCT: 40.3 % (ref 39.0–52.0)
Hemoglobin: 13.1 g/dL (ref 13.0–17.0)
MCH: 27.6 pg (ref 26.0–34.0)
MCHC: 32.5 g/dL (ref 30.0–36.0)
MCV: 84.8 fL (ref 80.0–100.0)
Platelets: 300 10*3/uL (ref 150–400)
RBC: 4.75 MIL/uL (ref 4.22–5.81)
RDW: 12.3 % (ref 11.5–15.5)
WBC: 6.4 10*3/uL (ref 4.0–10.5)
nRBC: 0 % (ref 0.0–0.2)

## 2018-05-26 LAB — BASIC METABOLIC PANEL
Anion gap: 12 (ref 5–15)
BUN: 14 mg/dL (ref 6–20)
CO2: 27 mmol/L (ref 22–32)
Calcium: 8.6 mg/dL — ABNORMAL LOW (ref 8.9–10.3)
Chloride: 95 mmol/L — ABNORMAL LOW (ref 98–111)
Creatinine, Ser: 1.25 mg/dL — ABNORMAL HIGH (ref 0.61–1.24)
GFR calc Af Amer: >60 mL/min (ref >60)
GFR calc non Af Amer: >60 mL/min (ref >60)
Glucose, Bld: 574 mg/dL (ref 70–99)
Potassium: 4 mmol/L (ref 3.5–5.1)
Sodium: 134 mmol/L — ABNORMAL LOW (ref 135–145)

## 2018-05-26 LAB — URINALYSIS, ROUTINE W REFLEX MICROSCOPIC
Bacteria, UA: NONE SEEN
Bilirubin Urine: NEGATIVE
Glucose, UA: >=500 mg/dL — AB
Ketones, ur: NEGATIVE mg/dL
Leukocytes, UA: NEGATIVE
Nitrite: NEGATIVE
Protein, ur: 100 mg/dL — AB
Specific Gravity, Urine: 1.029 (ref 1.005–1.030)
pH: 6 (ref 5.0–8.0)

## 2018-05-26 MED ORDER — INSULIN ASPART 100 UNIT/ML ~~LOC~~ SOLN
10.0000 [IU] | Freq: Once | SUBCUTANEOUS | Status: AC
  Administered 2018-05-26: 10 [IU] via SUBCUTANEOUS

## 2018-05-26 NOTE — ED Provider Notes (Signed)
[facility] EMERGENCY DEPARTMENT Provider Note   CSN: [id number] Arrival date & time: 05/25/18  2310     History   Chief Complaint Chief Complaint  Patient presents with  . Hyperglycemia    HPI [name] is a 48 y.o. male.  Patient presents to the emergency department with a chief complaint of hyperglycemia.  History obtained with visitor present after verifying patient approval.  Patient states that he has had polydipsia and blood urea along with dry mouth for the past 3 days.  He states that his blood sugars been running high.  He did not take his insulin yesterday for an unknown reason.  He denies any other associated symptoms.  It is that he needs to her and be treated so that he can go to work.  The history is provided by the patient. No language interpreter was used.    Past Medical History:  Diagnosis Date  . Diabetes mellitus type II, uncontrolled ([place]) 07/17/2006  . ESSENTIAL HYPERTENSION 07/17/2006  . HYPERLIPIDEMIA 11/24/2008  . Pneumonia 10/22/2011    Patient Active Problem List   Diagnosis Date Noted  . Healthcare maintenance 07/02/2016  . New left bundle branch block (LBBB)   . 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 Hyperlipidemia LDL goal <70 11/24/2008  . Uncontrolled type 2 diabetes mellitus with peripheral neuropathy ([place]) 07/17/2006    Past Surgical History:  Procedure Laterality Date  . ARM SURGERY          Home Medications    Prior to Admission medications   Medication Sig Start Date End Date Taking? Authorizing Provider  atorvastatin (LIPITOR) 20 MG tablet Take 1 tablet (20 mg total) by mouth daily. 06/10/17   Gildardo Pounds, NP  Blood Glucose Monitoring Suppl (TRUE METRIX METER) w/Device KIT Use as instructed 06/10/17   Gildardo Pounds, NP  cyclobenzaprine (FLEXERIL) 10 MG tablet Take 1 tablet (10 mg total) by mouth 2 (two) times daily as needed for muscle spasms. 02/17/17   Julianne Rice, MD  gabapentin  (NEURONTIN) 300 MG capsule Take 1 capsule (300 mg total) by mouth 3 (three) times daily. 01/24/18   [name], FNP  glucose blood test strip Use to check fasting blood sugar once daily. 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 Insulin dependent 06/10/17   Gildardo Pounds, NP  Insulin Detemir (LEVEMIR) 100 UNIT/ML Pen Inject 60 Units into the skin daily at 10 pm. 01/23/18 02/22/18  [name], FNP  insulin lispro (HUMALOG KWIKPEN) 100 UNIT/ML KiwkPen Inject 0.1 mLs (10 Units total) into the skin 3 (three) times daily. 01/23/18 02/22/18  [name], FNP  Insulin Pen Needle (B-D UF III MINI PEN NEEDLES) 31G X 5 MM MISC Use as instructed 09/08/17   Gildardo Pounds, NP  lisinopril (PRINIVIL,ZESTRIL) 10 MG tablet Take 1 tablet (10 mg total) by mouth daily. 06/10/17   Gildardo Pounds, NP  sildenafil (VIAGRA) 25 MG tablet Take 1 tablet (25 mg total) by mouth daily as needed for erectile dysfunction. 01/24/18   [name], FNP  TRUEPLUS LANCETS 28G MISC Use as instructed 09/08/17   Gildardo Pounds, NP    Family History Family History  Problem Relation Age of Onset  . Hypertension Mother 24  . Cancer Mother        bladder cancer  . Alcohol abuse Father   . Hypertension Brother     Social History Social History   Tobacco Use  . Smoking status: Never  Smoker  . Smokeless tobacco: Never Used  Substance Use Topics  . Alcohol use: No    Alcohol/week: 0.0 standard drinks  . Drug use: No     Allergies   Sulfa antibiotics; Bactrim [sulfamethoxazole-trimethoprim]; and Cephalexin   Review of Systems Review of Systems  All other systems reviewed and are negative.    Physical Exam Updated Vital Signs BP (!) 170/102 (BP Location: Right Arm)   Pulse 98   Temp 98.5 F (36.9 C) (Oral)   Resp 14   Ht 5' 7"  (1.702 m)   Wt 66.7 kg   SpO2 98%   BMI 23.02 kg/m   Physical Exam Vitals signs and nursing note reviewed.  Constitutional:      Appearance: He is well-developed.  HENT:     Head:  Normocephalic and atraumatic.  Eyes:     General: No scleral icterus.       Right eye: No discharge.        Left eye: No discharge.     Conjunctiva/sclera: Conjunctivae normal.     Pupils: Pupils are equal, round, and reactive to light.  Neck:     Musculoskeletal: Normal range of motion and neck supple.     Vascular: No JVD.  Cardiovascular:     Rate and Rhythm: Normal rate and regular rhythm.     Heart sounds: Normal heart sounds. No murmur. No friction rub. No gallop.   Pulmonary:     Effort: Pulmonary effort is normal. No respiratory distress.     Breath sounds: Normal breath sounds. No wheezing or rales.  Chest:     Chest wall: No tenderness.  Abdominal:     General: There is no distension.     Palpations: Abdomen is soft. There is no mass.     Tenderness: There is no abdominal tenderness. There is no guarding or rebound.  Musculoskeletal: Normal range of motion.        General: No tenderness.  Skin:    General: Skin is warm and dry.  Neurological:     Mental Status: He is alert and oriented to person, place, and time.  Psychiatric:        Behavior: Behavior normal.        Thought Content: Thought content normal.        Judgment: Judgment normal.      ED Treatments / Results  Labs (all labs ordered are listed, but only abnormal results are displayed) Labs Reviewed  BASIC METABOLIC PANEL - Abnormal; Notable for the following components:      Result Value   Sodium 134 (*)    Chloride 95 (*)    Glucose, Bld 574 (*)    Creatinine, Ser 1.25 (*)    Calcium 8.6 (*)    All other components within normal limits  URINALYSIS, ROUTINE W REFLEX MICROSCOPIC - Abnormal; Notable for the following components:   Color, Urine STRAW (*)    Glucose, UA >=500 (*)    Hgb urine dipstick MODERATE (*)    Protein, ur 100 (*)    All other components within normal limits  CBC  CBG MONITORING, ED    EKG None  Radiology No results found.  Procedures Procedures (including  critical care time)  Medications Ordered in ED Medications  insulin aspart (novoLOG) injection 10 Units (has no administration in time range)     Initial Impression / Assessment and Plan / ED Course  I have reviewed the triage vital signs and the nursing notes.  Pertinent labs & imaging results that were available during my care of the patient were reviewed by me and considered in my medical decision making (see chart for details).     Patient with history of diabetes.  He is insulin controlled.  He missed a dose of his insulin yesterday.  He states that he does still have insulin at home, but wanted to come to the emergency department for evaluation.  His electrolytes are reassuring, he does not have an elevated anion gap.  No evidence of DKA.  He is in no acute distress.  Patient states that he needs to go to work.  I offered him IV versus subcutaneous insulin and monitoring versus discharge.  Patient states that he would like to just get the subcutaneous insulin and be discharged.  Have advised him to continue his regular home regimen and sliding scale insulin.  He states that he will do so.  Final Clinical Impressions(s) / ED Diagnoses   Final diagnoses:  Hyperglycemia    ED Discharge Orders    None       Montine Circle, PA-C 05/26/18 [place], Delice Bison, DO 05/26/18 [phone]

## 2018-05-27 LAB — CBG MONITORING, ED: Glucose-Capillary: 497 mg/dL — ABNORMAL HIGH (ref 70–99)

## 2018-05-29 ENCOUNTER — Ambulatory Visit: Payer: Self-pay | Admitting: Family Medicine

## 2018-06-12 ENCOUNTER — Telehealth: Payer: Self-pay

## 2018-06-12 NOTE — Telephone Encounter (Signed)
Left a vm for patient to callback 

## 2018-06-12 NOTE — Telephone Encounter (Signed)
-----   Message from [name], [place] sent at 06/11/2018 10:49 PM EST ----- Regarding: "Follow Up" Please schedule patient for Hospital follow up appointment asap. Thank you.

## 2018-06-15 NOTE — Telephone Encounter (Signed)
Tried to contact patient no answer 

## 2018-06-15 NOTE — Telephone Encounter (Signed)
-----   Message from [name], [place] sent at 06/11/2018 10:49 PM EST ----- Regarding: "Follow Up" Please schedule patient for Hospital follow up appointment asap. Thank you.

## 2018-06-16 NOTE — Telephone Encounter (Signed)
-----   Message from [name], [place] sent at 06/11/2018 10:49 PM EST ----- Regarding: "Follow Up" Please schedule patient for Hospital follow up appointment asap. Thank you.

## 2018-06-16 NOTE — Telephone Encounter (Signed)
Left a vm for patient to callback 

## 2018-06-16 NOTE — Telephone Encounter (Signed)
-----   Message from [name], [place] sent at 06/11/2018 10:49 PM EST ----- Regarding: "Follow Up" Please schedule patient for Hospital follow up appointment asap. Thank you.

## 2018-06-18 NOTE — Telephone Encounter (Signed)
Tried to contact patient no answer and left a vm for patient to callback

## 2018-06-18 NOTE — Telephone Encounter (Signed)
-----   Message from [name], [place] sent at 06/11/2018 10:49 PM EST ----- Regarding: "Follow Up" Please schedule patient for Hospital follow up appointment asap. Thank you.

## 2018-06-23 NOTE — Telephone Encounter (Signed)
Per patient brother he is currently locked up at that's why he can't make appointment right now.

## 2018-06-23 NOTE — Telephone Encounter (Signed)
-----   Message from [name], [place] sent at 06/11/2018 10:49 PM EST ----- Regarding: "Follow Up" Please schedule patient for Hospital follow up appointment asap. Thank you.

## 2018-06-24 ENCOUNTER — Ambulatory Visit (HOSPITAL_COMMUNITY)
Admission: EM | Admit: 2018-06-24 | Discharge: 2018-06-24 | Disposition: A | Discharge status: Home / Self Care | Payer: Self-pay | Attending: Family Medicine | Admitting: Family Medicine

## 2018-06-24 ENCOUNTER — Encounter (HOSPITAL_COMMUNITY): Payer: Self-pay | Admitting: Emergency Medicine

## 2018-06-24 DIAGNOSIS — I10 Essential (primary) hypertension: Secondary | ICD-10-CM

## 2018-06-24 DIAGNOSIS — Z794 Long term (current) use of insulin: Secondary | ICD-10-CM

## 2018-06-24 DIAGNOSIS — S90424A Blister (nonthermal), right lesser toe(s), initial encounter: Secondary | ICD-10-CM

## 2018-06-24 DIAGNOSIS — E11620 Type 2 diabetes mellitus with diabetic dermatitis: Secondary | ICD-10-CM | POA: Diagnosis present

## 2018-06-24 NOTE — ED Triage Notes (Signed)
Pt presents to [facility] for assessment of right shin pain, second toe pain of the left foot.  Pt is a diabetic.  Also would like to know why the metal in his right arm from a repair hurts when it gets cold.

## 2018-06-24 NOTE — ED Provider Notes (Signed)
[name]    CSN: [id number] Arrival date & time: 06/24/18  1646     History   Chief Complaint Chief Complaint  Patient presents with  . Wound Check    HPI [name] is a 48 y.o. male.   Patient has several concerns first he has a blister on second toe of left foot.  He had this previously and it healed up.  He put boots back on that do not seem to fit and blister has recurred.  He is diabetic on insulin but not well controlled. 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 Diabetes mellitus type II, uncontrolled ([place]) 07/17/2006  . ESSENTIAL HYPERTENSION 07/17/2006  . HYPERLIPIDEMIA 11/24/2008  . Pneumonia 10/22/2011    Patient Active Problem List   Diagnosis Date Noted  . Healthcare maintenance 07/02/2016  . New left bundle branch block (LBBB)   . 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 Hyperlipidemia LDL goal <70 11/24/2008  . Uncontrolled type 2 diabetes mellitus with peripheral neuropathy ([place]) 07/17/2006    Past Surgical History:  Procedure Laterality Date  . ARM SURGERY         Home Medications    Prior to Admission medications   Medication Sig Start Date End Date Taking? Authorizing Provider  atorvastatin (LIPITOR) 20 MG tablet Take 1 tablet (20 mg total) by mouth daily. 06/10/17   Gildardo Pounds, NP  Blood Glucose Monitoring Suppl (TRUE METRIX METER) w/Device KIT Use as instructed 06/10/17   Gildardo Pounds, NP  cyclobenzaprine (FLEXERIL) 10 MG tablet Take 1 tablet (10 mg total) by mouth 2 (two) times daily as needed for muscle spasms. 02/17/17   Julianne Rice, MD  gabapentin (NEURONTIN) 300 MG capsule Take 1 capsule (300 mg total) by mouth 3 (three) times daily. 01/24/18   [name], FNP  glucose blood test strip Use to check fasting blood sugar once daily. 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 Insulin dependent 06/10/17   Gildardo Pounds, NP  Insulin Detemir (LEVEMIR) 100 UNIT/ML Pen Inject 60 Units into the skin daily at 10 pm. 01/23/18 02/22/18  [name], FNP  insulin lispro (HUMALOG KWIKPEN) 100 UNIT/ML KiwkPen Inject 0.1 mLs (10 Units total) into the skin 3 (three) times daily. 01/23/18 02/22/18  [name], FNP  Insulin Pen Needle (B-D UF III MINI PEN NEEDLES) 31G X 5 MM MISC Use as instructed 09/08/17   Gildardo Pounds, NP  lisinopril (PRINIVIL,ZESTRIL) 10 MG tablet Take 1 tablet (10 mg total) by mouth daily. 06/10/17   Gildardo Pounds, NP  sildenafil (VIAGRA) 25 MG tablet Take 1 tablet (25 mg total) by mouth daily as needed for erectile dysfunction. 01/24/18   [name], FNP  TRUEPLUS LANCETS 28G MISC Use as instructed 09/08/17   Gildardo Pounds, NP    Family History Family History  Problem Relation Age of Onset  . Hypertension Mother 64  . Cancer Mother        bladder cancer  . Alcohol abuse Father   . Hypertension Brother     Social History Social History   Tobacco Use  . Smoking status: Never Smoker  . Smokeless tobacco: Never Used  Substance Use Topics  . Alcohol use: No    Alcohol/week: 0.0 standard drinks  . Drug use: No     Allergies   Sulfa antibiotics; Bactrim [  sulfamethoxazole-trimethoprim]; and Cephalexin   Review of Systems Review of Systems  Musculoskeletal: Positive for arthralgias.  Skin: Positive for rash.  All other systems reviewed and are negative.    Physical Exam Triage Vital Signs ED Triage Vitals [06/24/18 1809]  Enc Vitals Group     BP (!) 190/113     Pulse Rate (!) 107     Resp 16     Temp 97.9 F (36.6 C)     Temp Source Temporal     SpO2 100 %     Weight      Height      Head Circumference      Peak Flow      Pain Score 7     Pain Loc      Pain Edu?      Excl. in [place]?    No data found.  Updated Vital Signs BP (!) 190/113 (BP Location: Right Arm)   Pulse (!) 107   Temp 97.9 F (36.6 C) (Temporal)   Resp 16   SpO2 100%    Visual Acuity Right Eye Distance:   Left Eye Distance:   Bilateral Distance:    Right Eye Near:   Left Eye Near:    Bilateral Near:     Physical Exam Constitutional:      Appearance: Normal appearance. He is normal weight.  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  Have recommended that he not wear those boots which have caused this twice now.  Keep blister clean when the skin sloughs he can apply some Bactroban but bears careful watching given his diabetes  Neurological:     Mental Status: He is alert.      UC Treatments / Results  Labs (all labs ordered are listed, but only abnormal results are displayed) Labs Reviewed - No data to display  EKG None  Radiology No results found.  Procedures Procedures (including critical care time)  Medications Ordered in UC Medications - No data to display  Initial Impression / Assessment and Plan / UC Course  I have reviewed the triage vital signs and the nursing notes.  Pertinent labs & imaging results that were available during my care of the patient were reviewed by me and considered in my medical decision making (see chart for details).     Blister left second toe Necrobiosis diabeticorum Final Clinical Impressions(s) / UC Diagnoses   Final diagnoses:  None   Discharge Instructions   None    ED Prescriptions    None     Controlled Substance Prescriptions [place] Controlled Substance Registry consulted? No   Wardell Honour, MD 06/24/18 [place]

## 2018-06-24 NOTE — Discharge Instructions (Addendum)
Try to avoid on his carbohydrates in your diet Pick up mupirocin/Bactroban for blister on toe

## 2018-06-30 ENCOUNTER — Ambulatory Visit: Payer: Self-pay | Admitting: Family Medicine

## 2018-07-01 ENCOUNTER — Ambulatory Visit (INDEPENDENT_AMBULATORY_CARE_PROVIDER_SITE_OTHER): Payer: Self-pay | Admitting: Family Medicine

## 2018-07-01 ENCOUNTER — Encounter: Payer: Self-pay | Admitting: Family Medicine

## 2018-07-01 VITALS — BP 150/92 | HR 100 | Temp 98.4°F | Ht 67.0 in | Wt 156.0 lb

## 2018-07-01 DIAGNOSIS — N529 Male erectile dysfunction, unspecified: Secondary | ICD-10-CM

## 2018-07-01 DIAGNOSIS — I10 Essential (primary) hypertension: Secondary | ICD-10-CM

## 2018-07-01 DIAGNOSIS — E1142 Type 2 diabetes mellitus with diabetic polyneuropathy: Secondary | ICD-10-CM

## 2018-07-01 DIAGNOSIS — E785 Hyperlipidemia, unspecified: Secondary | ICD-10-CM

## 2018-07-01 DIAGNOSIS — IMO0002 Reserved for concepts with insufficient information to code with codable children: Secondary | ICD-10-CM

## 2018-07-01 DIAGNOSIS — E1165 Type 2 diabetes mellitus with hyperglycemia: Secondary | ICD-10-CM

## 2018-07-01 DIAGNOSIS — R8290 Unspecified abnormal findings in urine: Secondary | ICD-10-CM

## 2018-07-01 DIAGNOSIS — Z09 Encounter for follow-up examination after completed treatment for conditions other than malignant neoplasm: Secondary | ICD-10-CM

## 2018-07-01 LAB — POCT URINALYSIS DIP (MANUAL ENTRY)
Bilirubin, UA: NEGATIVE
Glucose, UA: 500 mg/dL — AB
Leukocytes, UA: NEGATIVE
Nitrite, UA: NEGATIVE
Protein Ur, POC: >=300 mg/dL — AB
Spec Grav, UA: >=1.03 — AB (ref 1.010–1.025)
Urobilinogen, UA: 0.2 E.U./dL
pH, UA: 5.5 (ref 5.0–8.0)

## 2018-07-01 LAB — POCT GLYCOSYLATED HEMOGLOBIN (HGB A1C): Hemoglobin A1C: 12.6 % — AB (ref 4.0–5.6)

## 2018-07-01 LAB — GLUCOSE, POCT (MANUAL RESULT ENTRY): POC Glucose: 215 mg/dl — AB (ref 70–99)

## 2018-07-01 MED ORDER — LISINOPRIL 10 MG PO TABS: 10.0000 mg | ORAL_TABLET | Freq: Every day | ORAL | 3 refills | Status: DC

## 2018-07-01 MED ORDER — SILDENAFIL CITRATE 25 MG PO TABS: 25.0000 mg | ORAL_TABLET | Freq: Every day | ORAL | 3 refills | Status: DC | PRN

## 2018-07-01 MED ORDER — ATORVASTATIN CALCIUM 20 MG PO TABS: 20.0000 mg | ORAL_TABLET | Freq: Every day | ORAL | 3 refills | Status: DC

## 2018-07-01 MED ORDER — INSULIN DETEMIR 100 UNIT/ML ~~LOC~~ SOLN: 30.0000 [IU] | Freq: Every day | SUBCUTANEOUS | 11 refills | Status: DC

## 2018-07-01 MED ORDER — GABAPENTIN 300 MG PO CAPS: 300.0000 mg | ORAL_CAPSULE | Freq: Three times a day (TID) | ORAL | 3 refills | Status: DC

## 2018-07-01 NOTE — Progress Notes (Signed)
Patient [facility]  Established Patient Office Visit  Subjective:  Patient ID: [name], male    DOB: 1970-12-22  Age: 48 y.o. MRN: [id number]  CC:  Chief Complaint  Patient presents with  . Hospitalization Follow-up    HPI [name] is a 49 year old male who presents for follow up.  Past Medical History:  Diagnosis Date  . Diabetes mellitus type II, uncontrolled ([place]) 07/17/2006  . ESSENTIAL HYPERTENSION 07/17/2006  . HYPERLIPIDEMIA 11/24/2008  . Pneumonia 10/22/2011     Current Status: Since his last office visit, he is doing well with no complaints.    He denies visual changes, chest pain, cough, shortness of breath, heart palpitations, and falls. He has occasional headaches and dizziness with position changes. Denies severe headaches, confusion, seizures, double vision, and blurred vision, nausea and vomiting. He denies fatigue, frequent urination, blurred vision, excessive hunger, excessive thirst, weight gain, weight loss, and poor wound healing. His anxiety is mild today. He denies suicidal ideations, homicidal ideations, or auditory hallucinations. He is accompanied today by his brother.   He denies fevers, chills, recent infections, weight loss, and night sweats. No reports of GI problems such as diarrhea, and constipation. He has no reports of blood in stools, dysuria and hematuria. He denies pain today.   Past Surgical History:  Procedure Laterality Date  . ARM SURGERY      Family History  Problem Relation Age of Onset  . Hypertension Mother 86  . Cancer Mother        bladder cancer  . Alcohol abuse Father   . Hypertension Brother     Social History   Socioeconomic History  . Marital status: Married    Spouse name: Not on file  . Number of children: Not on file  . Years of education: Not on file  . Highest education level: Not on file  Occupational History  . Occupation: Training and development officer    Comment: lost job    . Occupation: Biochemist, clinical    Comment: Updated June 2013  . Occupation: Engineer, civil (consulting)    Comment: Began Jan 2014  Social Needs  . Financial resource strain: Not on file  . Food insecurity:    Worry: Not on file    Inability: Not on file  . Transportation needs:    Medical: Not on file    Non-medical: Not on file  Tobacco Use  . Smoking status: Never Smoker  . Smokeless tobacco: Never Used  Substance and Sexual Activity  . Alcohol use: No    Alcohol/week: 0.0 standard drinks  . Drug use: No  . Sexual activity: Yes    Partners: Female  Lifestyle  . Physical activity:    Days per week: Not on file    Minutes per session: Not on file  . Stress: Not on file  Relationships  . Social connections:    Talks on phone: Not on file    Gets together: Not on file    Attends religious service: Not on file    Active member of club or organization: Not on file    Attends meetings of clubs or organizations: Not on file    Relationship status: Not on file  . Intimate partner violence:    Fear of current or ex partner: Not on file    Emotionally abused: Not on file    Physically abused: Not on file    Forced sexual activity: Not on file  Other Topics Concern  . Not on file  Social History Narrative   Lives with his mother and brother.   Wife has had a foot amputation; is a type 1 DM.    Outpatient Medications Prior to Visit  Medication Sig Dispense Refill  . insulin lispro (HUMALOG KWIKPEN) 100 UNIT/ML KiwkPen Inject 0.1 mLs (10 Units total) into the skin 3 (three) times daily. 15 mL 11  . Insulin Detemir (LEVEMIR) 100 UNIT/ML Pen Inject 60 Units into the skin daily at 10 pm. 15 mL 11  . Blood Glucose Monitoring Suppl (TRUE METRIX METER) w/Device KIT Use as instructed (Patient not taking: Reported on 07/01/2018) 1 kit 0  . cyclobenzaprine (FLEXERIL) 10 MG tablet Take 1 tablet (10 mg total) by mouth 2 (two) times daily as needed for muscle spasms. (Patient not taking: Reported on  07/01/2018) 20 tablet 0  . glucose blood test strip Use to check fasting blood sugar once daily. 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 Insulin dependent (Patient not taking: Reported on 07/01/2018) 100 each 12  . Insulin Pen Needle (B-D UF III MINI PEN NEEDLES) 31G X 5 MM MISC Use as instructed (Patient not taking: Reported on 07/01/2018) 90 each 1  . TRUEPLUS LANCETS 28G MISC Use as instructed (Patient not taking: Reported on 07/01/2018) 100 each 3  . atorvastatin (LIPITOR) 20 MG tablet Take 1 tablet (20 mg total) by mouth daily. (Patient not taking: Reported on 07/01/2018) 90 tablet 3  . gabapentin (NEURONTIN) 300 MG capsule Take 1 capsule (300 mg total) by mouth 3 (three) times daily. (Patient not taking: Reported on 07/01/2018) 90 capsule 3  . lisinopril (PRINIVIL,ZESTRIL) 10 MG tablet Take 1 tablet (10 mg total) by mouth daily. (Patient not taking: Reported on 07/01/2018) 90 tablet 3  . sildenafil (VIAGRA) 25 MG tablet Take 1 tablet (25 mg total) by mouth daily as needed for erectile dysfunction. (Patient not taking: Reported on 07/01/2018) 10 tablet 2   No facility-administered medications prior to visit.     Allergies  Allergen Reactions  . Sulfa Antibiotics Rash    Severe rash. Do not give sulfa medications  . Bactrim [Sulfamethoxazole-Trimethoprim]     Rash  . Cephalexin     Patient with severe drug reaction including exfoliating skin rash and hypotension after being prescribed both cephalexin and sulfamethoxazole-trimethoprim simultaneously. Favor SMX as much more likely culprit as patient had tolerated other cephalosporins in the past, but cannot say with complete certainty that this was not related to cephalexin.     ROS Review of Systems  Constitutional: Negative.   HENT: Negative.   Eyes: Negative.   Respiratory: Negative.   Cardiovascular: Negative.   Gastrointestinal: Negative.   Endocrine: Negative.   Genitourinary: Negative.   Musculoskeletal: Negative.   Skin: Negative.     Allergic/Immunologic: Negative.   Neurological: Positive for dizziness and headaches.  Hematological: Negative.   Psychiatric/Behavioral: Negative.     Objective:    Physical Exam  Constitutional: He is oriented to person, place, and time. He appears well-developed and well-nourished.  HENT:  Head: Normocephalic and atraumatic.  Eyes: Conjunctivae are normal.  Neck: Normal range of motion. Neck supple.  Cardiovascular: Normal rate, regular rhythm and normal heart sounds.  Pulmonary/Chest: Effort normal and breath sounds normal.  Abdominal: Soft. Bowel sounds are normal.  Musculoskeletal: Normal range of motion.  Neurological: He is alert and oriented to person, place, and time.  Skin: Skin is warm and dry.  Psychiatric: He has a normal mood and affect. His behavior  is normal. Judgment and thought content normal.  Nursing note and vitals reviewed.   BP (!) 150/92   Pulse 100   Temp 98.4 F (36.9 C) (Oral)   Ht 5' 7"  (1.702 m)   Wt 156 lb (70.8 kg)   SpO2 92%   BMI 24.43 kg/m  Wt Readings from Last 3 Encounters:  07/01/18 156 lb (70.8 kg)  05/25/18 147 lb (66.7 kg)  01/23/18 146 lb (66.2 kg)     Health Maintenance Due  Topic Date Due  . OPHTHALMOLOGY EXAM  03/19/1983  . TETANUS/TDAP  03/18/1992  . FOOT EXAM  08/20/2017  . LIPID PANEL  06/10/2018    There are no preventive care reminders to display for this patient.  Lab Results  Component Value Date   TSH 0.788 10/22/2011   Lab Results  Component Value Date   WBC 6.4 05/25/2018   HGB 13.1 05/25/2018   HCT 40.3 05/25/2018   MCV 84.8 05/25/2018   PLT 300 05/25/2018   Lab Results  Component Value Date   NA 134 (L) 05/25/2018   K 4.0 05/25/2018   CO2 27 05/25/2018   GLUCOSE 574 (HH) 05/25/2018   BUN 14 05/25/2018   CREATININE 1.25 (H) 05/25/2018   BILITOT 0.8 07/08/2015   ALKPHOS 85 07/08/2015   AST 24 07/08/2015   ALT 18 07/08/2015   PROT 7.7 07/08/2015   ALBUMIN 3.7 07/08/2015   CALCIUM 8.6  (L) 05/25/2018   ANIONGAP 12 05/25/2018   Lab Results  Component Value Date   CHOL 159 06/10/2017   Lab Results  Component Value Date   HDL 36 (L) 06/10/2017   Lab Results  Component Value Date   LDLCALC 88 06/10/2017   Lab Results  Component Value Date   TRIG 177 (H) 06/10/2017   Lab Results  Component Value Date   CHOLHDL 4.4 06/10/2017   Lab Results  Component Value Date   HGBA1C 12.6 (A) 07/01/2018   Assessment & Plan:   1. Uncontrolled type 2 diabetes mellitus with peripheral neuropathy (HCC) Improved. Hgb A1c is decreased at 12.6 today, from 14.7 on 01/23/2018. Continue Antidiabetic medications as prescribed.  He will continue to decrease foods/beverages high in sugars and carbs and follow Heart Healthy or DASH diet. Increase physical activity to at least 30 minutes cardio exercise daily.  - POCT urinalysis dipstick - POCT glucose (manual entry) - POCT glycosylated hemoglobin (Hb A1C) - insulin detemir (LEVEMIR) 100 UNIT/ML injection; Inject 0.3 mLs (30 Units total) into the skin daily.  Dispense: 10 mL; Refill: 11 - gabapentin (NEURONTIN) 300 MG capsule; Take 1 capsule (300 mg total) by mouth 3 (three) times daily.  Dispense: 90 capsule; Refill: 3   2. Hyperlipidemia LDL goal <70 Continue diet low in fat as directed.  - atorvastatin (LIPITOR) 20 MG tablet; Take 1 tablet (20 mg total) by mouth daily.  Dispense: 90 tablet; Refill: 3  3. Essential hypertension Blood pressure is stable at 150/92 today. He will continue antihypertensive medications as prescribed. He will continue to decrease high sodium intake, excessive alcohol intake, increase potassium intake, smoking cessation, and increase physical activity of at least 30 minutes of cardio activity daily. He will continue to follow Heart Healthy or DASH diet. - lisinopril (PRINIVIL,ZESTRIL) 10 MG tablet; Take 1 tablet (10 mg total) by mouth daily.  Dispense: 90 tablet; Refill: 3  4. Erectile dysfunction, unspecified  erectile dysfunction type - sildenafil (VIAGRA) 25 MG tablet; Take 1 tablet (25 mg total) by mouth daily  as needed for erectile dysfunction.  Dispense: 10 tablet; Refill: 3  5. Abnormal urinalysis Results are pending..  - Urine Culture  6. Follow up He will follow up in 1 mont.   Meds ordered this encounter  Medications  . insulin detemir (LEVEMIR) 100 UNIT/ML injection    Sig: Inject 0.3 mLs (30 Units total) into the skin daily.    Dispense:  10 mL    Refill:  11  . atorvastatin (LIPITOR) 20 MG tablet    Sig: Take 1 tablet (20 mg total) by mouth daily.    Dispense:  90 tablet    Refill:  3  . gabapentin (NEURONTIN) 300 MG capsule    Sig: Take 1 capsule (300 mg total) by mouth 3 (three) times daily.    Dispense:  90 capsule    Refill:  3  . lisinopril (PRINIVIL,ZESTRIL) 10 MG tablet    Sig: Take 1 tablet (10 mg total) by mouth daily.    Dispense:  90 tablet    Refill:  3  . sildenafil (VIAGRA) 25 MG tablet    Sig: Take 1 tablet (25 mg total) by mouth daily as needed for erectile dysfunction.    Dispense:  10 tablet    Refill:  3   Orders Placed This Encounter  Procedures  . Urine Culture  . POCT urinalysis dipstick  . POCT glucose (manual entry)  . POCT glycosylated hemoglobin (Hb A1C)   Referral Orders  No referral(s) requested today    Kathe Becton,  MSN, FNP-C [facility] [place] [facility],  [phone]   Problem List Items Addressed This Visit      Endocrine   Uncontrolled type 2 diabetes mellitus with peripheral neuropathy ([place]) - Primary   Relevant Medications   insulin detemir (LEVEMIR) 100 UNIT/ML injection   atorvastatin (LIPITOR) 20 MG tablet   gabapentin (NEURONTIN) 300 MG capsule   lisinopril (PRINIVIL,ZESTRIL) 10 MG tablet   Other Relevant Orders   POCT urinalysis dipstick (Completed)   POCT glucose (manual entry) (Completed)   POCT glycosylated hemoglobin (Hb A1C) (Completed)       Other   Hyperlipidemia LDL goal <70   Relevant Medications   atorvastatin (LIPITOR) 20 MG tablet   lisinopril (PRINIVIL,ZESTRIL) 10 MG tablet   sildenafil (VIAGRA) 25 MG tablet    Other Visit Diagnoses    Essential hypertension       Relevant Medications   atorvastatin (LIPITOR) 20 MG tablet   lisinopril (PRINIVIL,ZESTRIL) 10 MG tablet   sildenafil (VIAGRA) 25 MG tablet   Erectile dysfunction, unspecified erectile dysfunction type       Relevant Medications   sildenafil (VIAGRA) 25 MG tablet   Abnormal urinalysis       Relevant Orders   Urine Culture (Completed)   Follow up          Meds ordered this encounter  Medications  . insulin detemir (LEVEMIR) 100 UNIT/ML injection    Sig: Inject 0.3 mLs (30 Units total) into the skin daily.    Dispense:  10 mL    Refill:  11  . atorvastatin (LIPITOR) 20 MG tablet    Sig: Take 1 tablet (20 mg total) by mouth daily.    Dispense:  90 tablet    Refill:  3  . gabapentin (NEURONTIN) 300 MG capsule    Sig: Take 1 capsule (300 mg total) by mouth 3 (three) times daily.    Dispense:  90 capsule  Refill:  3  . lisinopril (PRINIVIL,ZESTRIL) 10 MG tablet    Sig: Take 1 tablet (10 mg total) by mouth daily.    Dispense:  90 tablet    Refill:  3  . sildenafil (VIAGRA) 25 MG tablet    Sig: Take 1 tablet (25 mg total) by mouth daily as needed for erectile dysfunction.    Dispense:  10 tablet    Refill:  3    Follow-up: Return in about 1 month (around 07/30/2018).    [name], FNP

## 2018-07-03 LAB — URINE CULTURE

## 2018-07-14 ENCOUNTER — Telehealth (INDEPENDENT_AMBULATORY_CARE_PROVIDER_SITE_OTHER): Payer: Self-pay | Admitting: Orthopaedic Surgery

## 2018-07-14 MED FILL — !LEVEMIR 100 UNITS/ML VIAL: 100/ML | 33 days supply | Qty: 10 | Fill #0

## 2018-07-14 MED FILL — !VIAGRA 25 MG TABLET: 25 | 30 days supply | Qty: 10 | Fill #0

## 2018-07-14 NOTE — Telephone Encounter (Signed)
Returned call to patient left message to call back to schedule an appointment

## 2018-07-29 ENCOUNTER — Encounter: Payer: Self-pay | Admitting: Family Medicine

## 2018-07-29 ENCOUNTER — Other Ambulatory Visit: Payer: Self-pay

## 2018-07-29 ENCOUNTER — Ambulatory Visit (INDEPENDENT_AMBULATORY_CARE_PROVIDER_SITE_OTHER): Payer: Self-pay | Admitting: Family Medicine

## 2018-07-29 VITALS — BP 140/92 | HR 84 | Temp 98.2°F | Ht 67.0 in | Wt 155.0 lb

## 2018-07-29 DIAGNOSIS — R7309 Other abnormal glucose: Secondary | ICD-10-CM | POA: Insufficient documentation

## 2018-07-29 DIAGNOSIS — I10 Essential (primary) hypertension: Secondary | ICD-10-CM

## 2018-07-29 DIAGNOSIS — R21 Rash and other nonspecific skin eruption: Secondary | ICD-10-CM | POA: Insufficient documentation

## 2018-07-29 DIAGNOSIS — R8290 Unspecified abnormal findings in urine: Secondary | ICD-10-CM

## 2018-07-29 DIAGNOSIS — M62838 Other muscle spasm: Secondary | ICD-10-CM | POA: Insufficient documentation

## 2018-07-29 DIAGNOSIS — E1165 Type 2 diabetes mellitus with hyperglycemia: Secondary | ICD-10-CM

## 2018-07-29 DIAGNOSIS — Z09 Encounter for follow-up examination after completed treatment for conditions other than malignant neoplasm: Secondary | ICD-10-CM

## 2018-07-29 DIAGNOSIS — E1142 Type 2 diabetes mellitus with diabetic polyneuropathy: Secondary | ICD-10-CM

## 2018-07-29 DIAGNOSIS — IMO0002 Reserved for concepts with insufficient information to code with codable children: Secondary | ICD-10-CM

## 2018-07-29 DIAGNOSIS — N529 Male erectile dysfunction, unspecified: Secondary | ICD-10-CM

## 2018-07-29 DIAGNOSIS — E785 Hyperlipidemia, unspecified: Secondary | ICD-10-CM

## 2018-07-29 LAB — POCT URINALYSIS DIP (MANUAL ENTRY)
Bilirubin, UA: NEGATIVE
Glucose, UA: >=1000 mg/dL — AB
Ketones, POC UA: NEGATIVE mg/dL
Leukocytes, UA: NEGATIVE
Nitrite, UA: NEGATIVE
Protein Ur, POC: >=300 mg/dL — AB
Spec Grav, UA: 1.02 (ref 1.010–1.025)
Urobilinogen, UA: 1 E.U./dL
pH, UA: 6 (ref 5.0–8.0)

## 2018-07-29 LAB — POCT GLYCOSYLATED HEMOGLOBIN (HGB A1C): Hemoglobin A1C: 12.7 % — AB (ref 4.0–5.6)

## 2018-07-29 LAB — GLUCOSE, POCT (MANUAL RESULT ENTRY)
POC Glucose: 341 mg/dl — AB (ref 70–99)
POC Glucose: 437 mg/dl — AB (ref 70–99)

## 2018-07-29 MED ORDER — GABAPENTIN 300 MG PO CAPS: 300.0000 mg | ORAL_CAPSULE | Freq: Three times a day (TID) | ORAL | 3 refills | Status: DC

## 2018-07-29 MED ORDER — ATORVASTATIN CALCIUM 20 MG PO TABS: 20.0000 mg | ORAL_TABLET | Freq: Every day | ORAL | 3 refills | Status: DC

## 2018-07-29 MED ORDER — LISINOPRIL 10 MG PO TABS: 10.0000 mg | ORAL_TABLET | Freq: Every day | ORAL | 3 refills | Status: DC

## 2018-07-29 MED ORDER — INSULIN SYRINGES (DISPOSABLE) U-100 0.3 ML MISC: 100.0000 | Freq: Three times a day (TID) | 6 refills | Status: DC | PRN

## 2018-07-29 MED ORDER — SILDENAFIL CITRATE 25 MG PO TABS: 25.0000 mg | ORAL_TABLET | Freq: Every day | ORAL | 3 refills | Status: DC | PRN

## 2018-07-29 MED ORDER — TRUE METRIX METER W/DEVICE KIT: PACK | 0 refills | Status: DC

## 2018-07-29 MED ORDER — TRIAMCINOLONE ACETONIDE 0.1 % EX CREA: 1.0000 "application " | TOPICAL_CREAM | Freq: Two times a day (BID) | CUTANEOUS | 3 refills | Status: DC

## 2018-07-29 MED ORDER — INSULIN LISPRO 100 UNIT/ML ~~LOC~~ SOLN: SUBCUTANEOUS | 3 refills | Status: DC

## 2018-07-29 MED ORDER — INSULIN LISPRO 100 UNIT/ML ~~LOC~~ SOLN
20.0000 [IU] | Freq: Once | SUBCUTANEOUS | Status: AC
  Administered 2018-07-29: 20 [IU] via SUBCUTANEOUS

## 2018-07-29 MED ORDER — INSULIN DETEMIR 100 UNIT/ML ~~LOC~~ SOLN: 30.0000 [IU] | Freq: Every day | SUBCUTANEOUS | 11 refills | Status: DC

## 2018-07-29 MED ORDER — CYCLOBENZAPRINE HCL 10 MG PO TABS: 10.0000 mg | ORAL_TABLET | Freq: Two times a day (BID) | ORAL | 3 refills | Status: DC | PRN

## 2018-07-29 MED FILL — TRUEPLUS PEN NDL 31GX3/16": 31G X 5 MM | 25 days supply | Qty: 100 | Fill #0

## 2018-07-29 MED FILL — TRUEPLUS PEN NDL 31GX3/16: 31G X 5 MM | 25 days supply | Qty: 100 | Fill #0

## 2018-07-29 NOTE — Patient Instructions (Signed)
Triamcinolone skin cream, ointment, lotion, or aerosol What is this medicine? TRIAMCINOLONE (trye am SIN oh lone) is a corticosteroid. It is used on the skin to reduce swelling, redness, itching, and allergic reactions. This medicine may be used for other purposes; ask your health care provider or pharmacist if you have questions. COMMON BRAND NAME(S): Aristocort, Aristocort A, Aristocort HP, Cinalog, Cinolar, DERMASORB TA Complete, Flutex, Kenalog, Pediaderm TA, SP Rx 228, Triacet, Trianex, Triderm What should I tell my health care provider before I take this medicine? They need to know if you have any of these conditions: -diabetes -infection, like tuberculosis, herpes, or fungal infection -large areas of burned or damaged skin -skin wasting or thinning -an unusual or allergic reaction to triamcinolone, corticosteroids, other medicines, foods, dyes, or preservatives -pregnant or trying to get pregnant -breast-feeding How should I use this medicine? This medicine is for external use only. Do not take by mouth. Follow the directions on the prescription label. Wash your hands before and after use. Apply a thin film of medicine to the affected area. Do not cover with a bandage or dressing unless your doctor or health care professional tells you to. Do not use on healthy skin or over large areas of skin. Do not get this medicine in your eyes. If you do, rinse out with plenty of cool tap water. It is important not to use more medicine than prescribed. Do not use your medicine more often than directed. Talk to your pediatrician regarding the use of this medicine in children. Special care may be needed. Elderly patients are more likely to have damaged skin through aging, and this may increase side effects. This medicine should only be used for brief periods and infrequently in older patients. Overdosage: If you think you have taken too much of this medicine contact a poison control center or emergency  room at once. NOTE: This medicine is only for you. Do not share this medicine with others. What if I miss a dose? If you miss a dose, use it as soon as you can. If it is almost time for your next dose, use only that dose. Do not use double or extra doses. What may interact with this medicine? Interactions are not expected. This list may not describe all possible interactions. Give your health care provider a list of all the medicines, herbs, non-prescription drugs, or dietary supplements you use. Also tell them if you smoke, drink alcohol, or use illegal drugs. Some items may interact with your medicine. What should I watch for while using this medicine? Tell your doctor or health care professional if your symptoms do not start to get better within one week. Do not use for more than 14 days. Do not use on healthy skin or over large areas of skin. Tell your doctor or health care professional if you are exposed to anyone with measles or chickenpox, or if you develop sores or blisters that do not heal properly. Do not use an airtight bandage to cover the affected area unless your doctor or health care professional tells you to. If you are to cover the area, follow the instructions carefully. Covering the area where the medicine is applied can increase the amount that passes through the skin and increases the risk of side effects. If treating the diaper area of a child, avoid covering the treated area with tight-fitting diapers or plastic pants. This may increase the amount of medicine that passes through the skin and increase the risk of serious side   effects. What side effects may I notice from receiving this medicine? Side effects that you should report to your doctor or health care professional as soon as possible: -burning or itching of the skin -dark red spots on the skin -infection -painful, red, pus filled blisters in hair follicles -thinning of the skin, sunburn more likely especially on the  face Side effects that usually do not require medical attention (report to your doctor or health care professional if they continue or are bothersome): -dry skin, irritation -unusual increased growth of hair on the face or body This list may not describe all possible side effects. Call your doctor for medical advice about side effects. You may report side effects to FDA at 1-800-FDA-1088. Where should I keep my medicine? Keep out of the reach of children. Store at room temperature between 15 and 30 degrees C (59 and 86 degrees F). Do not freeze. Throw away any unused medicine after the expiration date. NOTE: This sheet is a summary. It may not cover all possible information. If you have questions about this medicine, talk to your doctor, pharmacist, or health care provider.  2019 Elsevier/Gold Standard (2013-08-26 15:59:51) Psoriasis  Psoriasis is a long-term (chronic) skin condition. It causes raised, red patches (plaques) on your skin that look silvery. The red patches may show up anywhere on your body. They can be any size or shape. Psoriasis can come and go. It can range from mild to very bad. It cannot be passed from one person to another (not contagious). There is no cure for this condition, but it can be helped with treatment. Follow these instructions at home: Skin Care  Apply moisturizers to your skin as needed. Only use those that your doctor has said are okay.  Apply cool compresses to the affected areas.  Do not scratch your skin. Lifestyle   Do not use tobacco products. This includes cigarettes, chewing tobacco, and e-cigarettes. If you need help quitting, ask your doctor.  Drink little or no alcohol.  Try to lower your stress. Meditation or yoga may help.  Get sun as told by your doctor. Do not get sunburned.  Think about joining a psoriasis support group. Medicines  Take or use over-the-counter and prescription medicines only as told by your doctor.  If you were  prescribed an antibiotic, take or use it as told by your doctor. Do not stop taking the antibiotic even if your condition starts to get better. General instructions  Keep a journal. Use this to help track what triggers an outbreak. Try to avoid any triggers.  See a counselor or social worker if you feel very sad, upset, or hopeless about your condition and these feelings affect your work or relationships.  Keep all follow-up visits as told by your doctor. This is important. Contact a doctor if:  Your pain gets worse.  You have more redness or warmth in the affected areas.  You have new pain or stiffness in your joints.  Your pain or stiffness in your joints gets worse.  Your nails start to break easily.  Your nails pull away from the nail bed easily.  You have a fever.  You feel very sad (depressed). This information is not intended to replace advice given to you by your health care provider. Make sure you discuss any questions you have with your health care provider. Document Released: 06/13/2004 Document Revised: 10/12/2015 Document Reviewed: 09/21/2014 Elsevier Interactive Patient Education  2019 [employer].

## 2018-07-29 NOTE — Progress Notes (Signed)
[facility]  Established Patient Office Visit  Subjective:  Patient ID: [name], male    DOB: February 16, 1971  Age: 48 y.o. MRN: [id number]  CC:  Chief Complaint  Patient presents with  . Follow-up    chronic condition    HPI [name] is a 48 year male presents for Hospital Follow Up today.   Past Medical History:  Diagnosis Date  . Diabetes mellitus type II, uncontrolled ([place]) 07/17/2006  . ESSENTIAL HYPERTENSION 07/17/2006  . HYPERLIPIDEMIA 11/24/2008  . Pneumonia 10/22/2011   Current Status: Since his last office visit, he has not been using Insulin as prescribed. He states that he has not had money to purchase medications. States that he is doing well with no complaints.   He denies visual changes, chest pain, cough, shortness of breath, heart palpitations, and falls. He has occasional headaches and dizziness with position changes. Denies severe headaches, confusion, seizures, double vision, and blurred vision, nausea and vomiting. He does not normally check his range of preprandial blood glucose levels. He denies fatigue, frequent urination, blurred vision, excessive hunger, excessive thirst, weight gain, weight loss, and poor wound healing.   He denies fevers, chills, fatigue, recent infections, weight loss, and night sweats. No reports of GI problems such as nausea, vomiting, diarrhea, and constipation. He has no reports of blood in stools, dysuria and hematuria. No depression or anxiety reported. He denies pain today.   Past Surgical History:  Procedure Laterality Date  . ARM SURGERY      Family History  Problem Relation Age of Onset  . Hypertension Mother 65  . Cancer Mother        bladder cancer  . Alcohol abuse Father   . Hypertension Brother     Social History   Socioeconomic History  . Marital status: Married    Spouse name: Not on file  . Number of children: Not on file  . Years of education: Not  on file  . Highest education level: Not on file  Occupational History  . 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 Occupation: Biochemist, clinical    Comment: Updated June 2013  . Occupation: Engineer, civil (consulting)    Comment: Began Jan 2014  Social Needs  . Financial resource strain: Not on file  . Food insecurity:    Worry: Not on file    Inability: Not on file  . Transportation needs:    Medical: Not on file    Non-medical: Not on file  Tobacco Use  . Smoking status: Never Smoker  . Smokeless tobacco: Never Used  Substance and Sexual Activity  . Alcohol use: No    Alcohol/week: 0.0 standard drinks  . Drug use: No  . Sexual activity: Yes    Partners: Female  Lifestyle  . Physical activity:    Days per week: Not on file    Minutes per session: Not on file  . Stress: Not on file  Relationships  . Social connections:    Talks on phone: Not on file    Gets together: Not on file    Attends religious service: Not on file    Active member of club or organization: Not on file    Attends meetings of clubs or organizations: Not on file    Relationship status: Not on file  . Intimate partner violence:    Fear of current or ex partner: Not on file    Emotionally  abused: Not on file    Physically abused: Not on file    Forced sexual activity: Not on file  Other Topics Concern  . Not on file  Social History Narrative   Lives with his mother and brother.   Wife has had a foot amputation; is a type 1 DM.    Outpatient Medications Prior to Visit  Medication Sig Dispense Refill  . glucose blood test strip Use to check fasting blood sugar once daily. 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 Insulin dependent (Patient not taking: Reported on 07/01/2018) 100 each 12  . Insulin Pen Needle (B-D UF III MINI PEN NEEDLES) 31G X 5 MM MISC Use as instructed (Patient not taking: Reported on 07/01/2018) 90 each 1  . TRUEPLUS LANCETS 28G MISC Use as instructed (Patient not taking: Reported on 07/01/2018) 100 each 3  .  atorvastatin (LIPITOR) 20 MG tablet Take 1 tablet (20 mg total) by mouth daily. (Patient not taking: Reported on 07/29/2018) 90 tablet 3  . Blood Glucose Monitoring Suppl (TRUE METRIX METER) w/Device KIT Use as instructed (Patient not taking: Reported on 07/01/2018) 1 kit 0  . cyclobenzaprine (FLEXERIL) 10 MG tablet Take 1 tablet (10 mg total) by mouth 2 (two) times daily as needed for muscle spasms. (Patient not taking: Reported on 07/01/2018) 20 tablet 0  . gabapentin (NEURONTIN) 300 MG capsule Take 1 capsule (300 mg total) by mouth 3 (three) times daily. (Patient not taking: Reported on 07/29/2018) 90 capsule 3  . insulin detemir (LEVEMIR) 100 UNIT/ML injection Inject 0.3 mLs (30 Units total) into the skin daily. (Patient not taking: Reported on 07/29/2018) 10 mL 11  . insulin lispro (HUMALOG KWIKPEN) 100 UNIT/ML KiwkPen Inject 0.1 mLs (10 Units total) into the skin 3 (three) times daily. 15 mL 11  . lisinopril (PRINIVIL,ZESTRIL) 10 MG tablet Take 1 tablet (10 mg total) by mouth daily. (Patient not taking: Reported on 07/29/2018) 90 tablet 3  . sildenafil (VIAGRA) 25 MG tablet Take 1 tablet (25 mg total) by mouth daily as needed for erectile dysfunction. (Patient not taking: Reported on 07/29/2018) 10 tablet 3   No facility-administered medications prior to visit.     Allergies  Allergen Reactions  . Sulfa Antibiotics Rash    Severe rash. Do not give sulfa medications  . Bactrim [Sulfamethoxazole-Trimethoprim]     Rash  . Cephalexin     Patient with severe drug reaction including exfoliating skin rash and hypotension after being prescribed both cephalexin and sulfamethoxazole-trimethoprim simultaneously. Favor SMX as much more likely culprit as patient had tolerated other cephalosporins in the past, but cannot say with complete certainty that this was not related to cephalexin.     ROS Review of Systems  Constitutional: Negative.   HENT: Negative.   Eyes: Negative.   Respiratory: Negative.    Cardiovascular: Negative.   Gastrointestinal: Negative.   Endocrine: Negative.   Genitourinary: Negative.   Musculoskeletal: Negative.   Skin: Negative.   Allergic/Immunologic: Negative.   Neurological: Positive for dizziness and headaches.  Hematological: Negative.   Psychiatric/Behavioral: Negative.    Objective:    Physical Exam  Constitutional: He is oriented to person, place, and time. He appears well-developed and well-nourished.  HENT:  Head: Normocephalic and atraumatic.  Eyes: Conjunctivae are normal.  Neck: Normal range of motion. Neck supple.  Cardiovascular: Normal rate, regular rhythm, normal heart sounds and intact distal pulses.  Pulmonary/Chest: Effort normal and breath sounds normal.  Abdominal: Soft. Bowel sounds are normal.  Musculoskeletal: Normal range of motion.  Neurological: He is alert and oriented to person, place, and time. He has normal reflexes.  Skin: Skin is warm and dry. Rash (Psoriasis- Neck and Extremities) noted.  Psychiatric: He has a normal mood and affect. His behavior is normal. Judgment and thought content normal.  Nursing note and vitals reviewed.   BP (!) 140/92 (BP Location: Left Arm, Patient Position: Sitting, Cuff Size: Small)   Pulse 84   Temp 98.2 F (36.8 C) (Oral)   Ht 5' 7"  (1.702 m)   Wt 155 lb (70.3 kg)   SpO2 100%   BMI 24.28 kg/m  Wt Readings from Last 3 Encounters:  07/29/18 155 lb (70.3 kg)  07/01/18 156 lb (70.8 kg)  05/25/18 147 lb (66.7 kg)     Health Maintenance Due  Topic Date Due  . OPHTHALMOLOGY EXAM  03/19/1983  . TETANUS/TDAP  03/18/1992  . FOOT EXAM  08/20/2017  . LIPID PANEL  06/10/2018    There are no preventive care reminders to display for this patient.  Lab Results  Component Value Date   TSH 0.788 10/22/2011   Lab Results  Component Value Date   WBC 6.4 05/25/2018   HGB 13.1 05/25/2018   HCT 40.3 05/25/2018   MCV 84.8 05/25/2018   PLT 300 05/25/2018   Lab Results  Component  Value Date   NA 134 (L) 05/25/2018   K 4.0 05/25/2018   CO2 27 05/25/2018   GLUCOSE 574 (HH) 05/25/2018   BUN 14 05/25/2018   CREATININE 1.25 (H) 05/25/2018   BILITOT 0.8 07/08/2015   ALKPHOS 85 07/08/2015   AST 24 07/08/2015   ALT 18 07/08/2015   PROT 7.7 07/08/2015   ALBUMIN 3.7 07/08/2015   CALCIUM 8.6 (L) 05/25/2018   ANIONGAP 12 05/25/2018   Lab Results  Component Value Date   CHOL 159 06/10/2017   Lab Results  Component Value Date   HDL 36 (L) 06/10/2017   Lab Results  Component Value Date   LDLCALC 88 06/10/2017   Lab Results  Component Value Date   TRIG 177 (H) 06/10/2017   Lab Results  Component Value Date   CHOLHDL 4.4 06/10/2017   Lab Results  Component Value Date   HGBA1C 12.7 (A) 07/29/2018   Assessment & Plan:   1. Uncontrolled type 2 diabetes mellitus with peripheral neuropathy ([facility]) - POCT glucose (manual entry) - POCT urinalysis dipstick - POCT glycosylated hemoglobin (Hb A1C) - gabapentin (NEURONTIN) 300 MG capsule; Take 1 capsule (300 mg total) by mouth 3 (three) times daily.  Dispense: 90 capsule; Refill: 3 - Blood Glucose Monitoring Suppl (TRUE METRIX METER) w/Device KIT; Use as instructed  Dispense: 1 kit; Refill: 0 - insulin detemir (LEVEMIR) 100 UNIT/ML injection; Inject 0.3 mLs (30 Units total) into the skin daily.  Dispense: 10 mL; Refill: 11 - insulin lispro (HUMALOG) 100 UNIT/ML injection; Inject 10 units under skin 3 times a day with meal, for blood glucose levels > 125.  Dispense: 10 mL; Refill: 3 - Insulin Syringes, Disposable, U-100 0.3 ML MISC; 100 Syringes by Does not apply route 3 (three) times daily with meals as needed.  Dispense: 100 each; Refill: 6  2. Elevated glucose Patient given 20 units of Humalog in office today. Refills for Insulin to pharmacy today. He is in the process of applying for [facility] Card for assistance with medications. He will continue to decrease foods/beverages high in sugars and carbs and follow Heart  Healthy or DASH diet. Increase physical activity to at least 30  minutes cardio exercise daily.  - insulin lispro (HUMALOG) injection 20 Units - POCT glucose (manual entry)  3. Essential hypertension She will continue to decrease high sodium intake, excessive alcohol intake, increase potassium intake, smoking cessation, and increase physical activity of at least 30 minutes of cardio activity daily. She will continue to follow Heart Healthy or DASH diet. - lisinopril (PRINIVIL,ZESTRIL) 10 MG tablet; Take 1 tablet (10 mg total) by mouth daily.  Dispense: 90 tablet; Refill: 3  4. Hyperlipidemia LDL goal <70 - atorvastatin (LIPITOR) 20 MG tablet; Take 1 tablet (20 mg total) by mouth daily.  Dispense: 90 tablet; Refill: 3  5. Erectile dysfunction, unspecified erectile dysfunction type - sildenafil (VIAGRA) 25 MG tablet; Take 1 tablet (25 mg total) by mouth daily as needed for erectile dysfunction.  Dispense: 10 tablet; Refill: 3  6. Muscle spasms of both lower extremities - cyclobenzaprine (FLEXERIL) 10 MG tablet; Take 1 tablet (10 mg total) by mouth 2 (two) times daily as needed for muscle spasms.  Dispense: 30 tablet; Refill: 3  7. Skin rash We will initiate Triamcinolone cream today.  - triamcinolone cream (KENALOG) 0.1 %; Apply 1 application topically 2 (two) times daily.  Dispense: 30 g; Refill: 3  8. Abnormal urinalysis Results are pending.  - Urine Culture  9. Follow up He will follow up in 1 month.   Meds ordered this encounter  Medications  . insulin lispro (HUMALOG) injection 20 Units  . atorvastatin (LIPITOR) 20 MG tablet    Sig: Take 1 tablet (20 mg total) by mouth daily.    Dispense:  90 tablet    Refill:  3  . cyclobenzaprine (FLEXERIL) 10 MG tablet    Sig: Take 1 tablet (10 mg total) by mouth 2 (two) times daily as needed for muscle spasms.    Dispense:  30 tablet    Refill:  3  . gabapentin (NEURONTIN) 300 MG capsule    Sig: Take 1 capsule (300 mg total) by mouth 3  (three) times daily.    Dispense:  90 capsule    Refill:  3  . Blood Glucose Monitoring Suppl (TRUE METRIX METER) w/Device KIT    Sig: Use as instructed    Dispense:  1 kit    Refill:  0  . insulin detemir (LEVEMIR) 100 UNIT/ML injection    Sig: Inject 0.3 mLs (30 Units total) into the skin daily.    Dispense:  10 mL    Refill:  11  . lisinopril (PRINIVIL,ZESTRIL) 10 MG tablet    Sig: Take 1 tablet (10 mg total) by mouth daily.    Dispense:  90 tablet    Refill:  3  . sildenafil (VIAGRA) 25 MG tablet    Sig: Take 1 tablet (25 mg total) by mouth daily as needed for erectile dysfunction.    Dispense:  10 tablet    Refill:  3  . insulin lispro (HUMALOG) 100 UNIT/ML injection    Sig: Inject 10 units under skin 3 times a day with meal, for blood glucose levels > 125.    Dispense:  10 mL    Refill:  3  . Insulin Syringes, Disposable, U-100 0.3 ML MISC    Sig: 100 Syringes by Does not apply route 3 (three) times daily with meals as needed.    Dispense:  100 each    Refill:  6  . triamcinolone cream (KENALOG) 0.1 %    Sig: Apply 1 application topically 2 (two) times daily.  Dispense:  30 g    Refill:  3    Orders Placed This Encounter  Procedures  . Urine Culture  . POCT glucose (manual entry)  . POCT urinalysis dipstick  . POCT glycosylated hemoglobin (Hb A1C)  . POCT glucose (manual entry)    Referral Orders  No referral(s) requested today    Kathe Becton,  MSN, FNP-C Patient [address] [place], [place] [address] [phone]   Problem List Items Addressed This Visit      Endocrine   Uncontrolled type 2 diabetes mellitus with peripheral neuropathy ([place]) - Primary   Relevant Medications   insulin lispro (HUMALOG) injection 20 Units (Completed)   atorvastatin (LIPITOR) 20 MG tablet   cyclobenzaprine (FLEXERIL) 10 MG tablet   gabapentin (NEURONTIN) 300 MG capsule   Blood Glucose Monitoring Suppl (TRUE METRIX METER)  w/Device KIT   insulin detemir (LEVEMIR) 100 UNIT/ML injection   lisinopril (PRINIVIL,ZESTRIL) 10 MG tablet   insulin lispro (HUMALOG) 100 UNIT/ML injection   Insulin Syringes, Disposable, U-100 0.3 ML MISC   Other Relevant Orders   POCT glucose (manual entry) (Completed)   POCT urinalysis dipstick (Completed)   POCT glycosylated hemoglobin (Hb A1C) (Completed)     Other   Hyperlipidemia LDL goal <70   Relevant Medications   atorvastatin (LIPITOR) 20 MG tablet   lisinopril (PRINIVIL,ZESTRIL) 10 MG tablet   sildenafil (VIAGRA) 25 MG tablet    Other Visit Diagnoses    Elevated glucose       Relevant Medications   insulin lispro (HUMALOG) injection 20 Units (Completed)   Other Relevant Orders   POCT glucose (manual entry) (Completed)   Essential hypertension       Relevant Medications   atorvastatin (LIPITOR) 20 MG tablet   lisinopril (PRINIVIL,ZESTRIL) 10 MG tablet   sildenafil (VIAGRA) 25 MG tablet   Erectile dysfunction, unspecified erectile dysfunction type       Relevant Medications   sildenafil (VIAGRA) 25 MG tablet   Muscle spasms of both lower extremities       Relevant Medications   cyclobenzaprine (FLEXERIL) 10 MG tablet   Skin rash       Relevant Medications   triamcinolone cream (KENALOG) 0.1 %   Abnormal urinalysis       Relevant Orders   Urine Culture   Follow up          Meds ordered this encounter  Medications  . insulin lispro (HUMALOG) injection 20 Units  . atorvastatin (LIPITOR) 20 MG tablet    Sig: Take 1 tablet (20 mg total) by mouth daily.    Dispense:  90 tablet    Refill:  3  . cyclobenzaprine (FLEXERIL) 10 MG tablet    Sig: Take 1 tablet (10 mg total) by mouth 2 (two) times daily as needed for muscle spasms.    Dispense:  30 tablet    Refill:  3  . gabapentin (NEURONTIN) 300 MG capsule    Sig: Take 1 capsule (300 mg total) by mouth 3 (three) times daily.    Dispense:  90 capsule    Refill:  3  . Blood Glucose Monitoring Suppl (TRUE  METRIX METER) w/Device KIT    Sig: Use as instructed    Dispense:  1 kit    Refill:  0  . insulin detemir (LEVEMIR) 100 UNIT/ML injection    Sig: Inject 0.3 mLs (30 Units total) into the skin daily.    Dispense:  10 mL  Refill:  11  . lisinopril (PRINIVIL,ZESTRIL) 10 MG tablet    Sig: Take 1 tablet (10 mg total) by mouth daily.    Dispense:  90 tablet    Refill:  3  . sildenafil (VIAGRA) 25 MG tablet    Sig: Take 1 tablet (25 mg total) by mouth daily as needed for erectile dysfunction.    Dispense:  10 tablet    Refill:  3  . insulin lispro (HUMALOG) 100 UNIT/ML injection    Sig: Inject 10 units under skin 3 times a day with meal, for blood glucose levels > 125.    Dispense:  10 mL    Refill:  3  . Insulin Syringes, Disposable, U-100 0.3 ML MISC    Sig: 100 Syringes by Does not apply route 3 (three) times daily with meals as needed.    Dispense:  100 each    Refill:  6  . triamcinolone cream (KENALOG) 0.1 %    Sig: Apply 1 application topically 2 (two) times daily.    Dispense:  30 g    Refill:  3    Follow-up: Return in about 1 month (around 08/29/2018).    [name], FNP

## 2018-07-30 MED FILL — ATORVASTATIN 20 MG TABLET: 20 | 30 days supply | Qty: 30 | Fill #0

## 2018-07-30 MED FILL — TRUEPLUS SYR 0.3ML 31GX5/16: 31G X 5/16" | 30 days supply | Qty: 100 | Fill #0

## 2018-07-30 MED FILL — ?HUMALOG 100 UNITS/ML VIAL: 100 | 33 days supply | Qty: 10 | Fill #0

## 2018-07-30 MED FILL — TRIAMCINOLONE ACETONIDE 0.1: 0.1 | 15 days supply | Qty: 30 | Fill #0

## 2018-07-30 MED FILL — LISINOPRIL 10 MG TABS: 10 | 30 days supply | Qty: 30 | Fill #0

## 2018-07-30 MED FILL — CYCLOBENZAPRINE 10 MG TAB: 10 | 15 days supply | Qty: 30 | Fill #0

## 2018-07-30 MED FILL — GABAPENTIN 300 MG CAPSULE: 300 | 30 days supply | Qty: 90 | Fill #0

## 2018-07-31 LAB — URINE CULTURE

## 2018-07-31 MED FILL — !TRUE METRIX BLOOD GLUCOSE: 365 days supply | Qty: 1 | Fill #0

## 2018-08-10 ENCOUNTER — Telehealth (INDEPENDENT_AMBULATORY_CARE_PROVIDER_SITE_OTHER): Payer: Self-pay

## 2018-08-10 NOTE — Telephone Encounter (Signed)
Called patient and asked the screening questions.  Do you have now or have you had in the past 7 days a fever and/or chills? NO  Do you have now or have you had in the past 7 days a cough? NO  Do you have now or have you had in the last 7 days nausea, vomiting or abdominal pain? NO  Have you been exposed to anyone who has tested positive for COVID-19? NO  Have you or anyone who lives with you traveled within the last month? NO

## 2018-08-11 ENCOUNTER — Ambulatory Visit (INDEPENDENT_AMBULATORY_CARE_PROVIDER_SITE_OTHER): Payer: Self-pay | Admitting: Orthopaedic Surgery

## 2018-08-14 MED FILL — !VIAGRA 25 MG TABLET: 25 | 30 days supply | Qty: 10 | Fill #1

## 2018-08-24 ENCOUNTER — Telehealth: Payer: Self-pay

## 2018-08-24 ENCOUNTER — Ambulatory Visit: Payer: Self-pay | Admitting: Family Medicine

## 2018-08-25 MED ORDER — ACCU-CHEK SOFT TOUCH LANCETS MISC: 12 refills | Status: DC

## 2018-08-25 NOTE — Telephone Encounter (Signed)
Lancets sent to pharmacy  °

## 2018-10-02 ENCOUNTER — Ambulatory Visit: Payer: Self-pay | Admitting: Family Medicine

## 2018-10-13 ENCOUNTER — Telehealth: Payer: Self-pay

## 2018-10-13 NOTE — Telephone Encounter (Signed)
Left a vm for patient to callback to do the appointment screening

## 2018-10-14 ENCOUNTER — Encounter: Payer: Self-pay | Admitting: Family Medicine

## 2018-10-14 ENCOUNTER — Other Ambulatory Visit: Payer: Self-pay

## 2018-10-14 ENCOUNTER — Ambulatory Visit (INDEPENDENT_AMBULATORY_CARE_PROVIDER_SITE_OTHER): Payer: Self-pay | Admitting: Family Medicine

## 2018-10-14 VITALS — BP 142/86 | HR 80 | Temp 98.2°F | Ht 67.0 in | Wt 154.0 lb

## 2018-10-14 DIAGNOSIS — Z09 Encounter for follow-up examination after completed treatment for conditions other than malignant neoplasm: Secondary | ICD-10-CM

## 2018-10-14 DIAGNOSIS — E1142 Type 2 diabetes mellitus with diabetic polyneuropathy: Secondary | ICD-10-CM

## 2018-10-14 DIAGNOSIS — R7309 Other abnormal glucose: Secondary | ICD-10-CM

## 2018-10-14 DIAGNOSIS — R8290 Unspecified abnormal findings in urine: Secondary | ICD-10-CM

## 2018-10-14 DIAGNOSIS — I10 Essential (primary) hypertension: Secondary | ICD-10-CM

## 2018-10-14 DIAGNOSIS — IMO0002 Reserved for concepts with insufficient information to code with codable children: Secondary | ICD-10-CM

## 2018-10-14 DIAGNOSIS — E1165 Type 2 diabetes mellitus with hyperglycemia: Secondary | ICD-10-CM

## 2018-10-14 LAB — POCT URINALYSIS DIP (MANUAL ENTRY)
Bilirubin, UA: NEGATIVE
Glucose, UA: >=1000 mg/dL — AB
Ketones, POC UA: NEGATIVE mg/dL
Leukocytes, UA: NEGATIVE
Nitrite, UA: NEGATIVE
Protein Ur, POC: >=300 mg/dL — AB
Spec Grav, UA: 1.025 (ref 1.010–1.025)
Urobilinogen, UA: 0.2 E.U./dL
pH, UA: 5.5 (ref 5.0–8.0)

## 2018-10-14 LAB — POCT GLYCOSYLATED HEMOGLOBIN (HGB A1C): Hemoglobin A1C: 13.5 % — AB (ref 4.0–5.6)

## 2018-10-14 LAB — GLUCOSE, POCT (MANUAL RESULT ENTRY): POC Glucose: 404 mg/dl — AB (ref 70–99)

## 2018-10-14 MED ORDER — INSULIN LISPRO 100 UNIT/ML ~~LOC~~ SOLN
20.0000 [IU] | Freq: Once | SUBCUTANEOUS | Status: AC
  Administered 2018-10-14: 20 [IU] via SUBCUTANEOUS

## 2018-10-14 MED ORDER — ACCU-CHEK SOFT TOUCH LANCETS MISC: 12 refills | Status: DC

## 2018-10-14 MED ORDER — TRUEPLUS LANCETS 28G MISC: 3 refills | Status: DC

## 2018-10-14 MED ORDER — INSULIN LISPRO 100 UNIT/ML ~~LOC~~ SOLN: SUBCUTANEOUS | 3 refills | Status: DC

## 2018-10-14 MED FILL — !HUMALOG 100 UNITS/ML VIAL: 100 | 25 days supply | Qty: 10 | Fill #0

## 2018-10-14 MED FILL — TRUEplus LANCETS 28G MISC: 25 days supply | Qty: 100 | Fill #0

## 2018-10-14 NOTE — Progress Notes (Signed)
Patient [name]   Established Patient Office Visit  Subjective:  Patient ID: [name], male    DOB: 06-Dec-1970  Age: 48 y.o. MRN: [id number]  CC:  Chief Complaint  Patient presents with  . Follow-up    diabetes    HPI [name] is a 39 male who presents for follow up.   Past Medical History:  Diagnosis Date  . Diabetes mellitus type II, uncontrolled ([place]) 07/17/2006  . ESSENTIAL HYPERTENSION 07/17/2006  . HYPERLIPIDEMIA 11/24/2008  . Pneumonia 10/22/2011   Current Status: Since his last office visit, he is doing well with no complaints. He does admit to blurred vision today. He denies fatigue, frequent urination, blurred vision, excessive hunger, excessive thirst, weight gain, weight loss, and poor wound healing. He continues to check her feet regularly. He admits that he has not been taking his blood glucose levels lately. He denies chest pain, cough, shortness of breath, heart palpitations, and falls. He has occasional headaches and dizziness with position changes. Denies severe headaches, confusion, seizures, double vision, nausea and vomiting.  He denies fevers, chills, fatigue, recent infections, weight loss, and night sweats. No reports of GI problems such as nausea, vomiting, diarrhea, and constipation. He has no reports of blood in stools, dysuria and hematuria. No depression or anxiety reported. He denies pain today.   Past Surgical History:  Procedure Laterality Date  . ARM SURGERY      Family History  Problem Relation Age of Onset  . Hypertension Mother 22  . Cancer Mother        bladder cancer  . Alcohol abuse Father   . Hypertension Brother     Social History   Socioeconomic History  . Marital status: Married    Spouse name: Not on file  . Number of children: Not on file  . Years of education: Not on file  . Highest education level: Not on file  Occupational History  . Occupation: Training and development officer   Comment: lost job  . Occupation: Biochemist, clinical    Comment: Updated June 2013  . Occupation: Engineer, civil (consulting)    Comment: Began Jan 2014  Social Needs  . Financial resource strain: Not on file  . Food insecurity:    Worry: Not on file    Inability: Not on file  . Transportation needs:    Medical: Not on file    Non-medical: Not on file  Tobacco Use  . Smoking status: Never Smoker  . Smokeless tobacco: Never Used  Substance and Sexual Activity  . Alcohol use: No    Alcohol/week: 0.0 standard drinks  . Drug use: No  . Sexual activity: Yes    Partners: Female  Lifestyle  . Physical activity:    Days per week: Not on file    Minutes per session: Not on file  . Stress: Not on file  Relationships  . Social connections:    Talks on phone: Not on file    Gets together: Not on file    Attends religious service: Not on file    Active member of club or organization: Not on file    Attends meetings of clubs or organizations: Not on file    Relationship status: Not on file  . Intimate partner violence:    Fear of current or ex partner: Not on file    Emotionally abused: Not on file    Physically abused: Not on file    Forced sexual activity:  Not on file  Other Topics Concern  . Not on file  Social History Narrative   Lives with his mother and brother.   Wife has had a foot amputation; is a type 1 DM.    Outpatient Medications Prior to Visit  Medication Sig Dispense Refill  . atorvastatin (LIPITOR) 20 MG tablet Take 1 tablet (20 mg total) by mouth daily. (Patient not taking: Reported on 10/14/2018) 90 tablet 3  . Blood Glucose Monitoring Suppl (TRUE METRIX METER) w/Device KIT Use as instructed (Patient not taking: Reported on 10/14/2018) 1 kit 0  . cyclobenzaprine (FLEXERIL) 10 MG tablet Take 1 tablet (10 mg total) by mouth 2 (two) times daily as needed for muscle spasms. (Patient not taking: Reported on 10/14/2018) 30 tablet 3  . gabapentin (NEURONTIN) 300 MG capsule Take 1 capsule  (300 mg total) by mouth 3 (three) times daily. (Patient not taking: Reported on 10/14/2018) 90 capsule 3  . glucose blood test strip Use to check fasting blood sugar once daily. 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 Insulin dependent (Patient not taking: Reported on 07/01/2018) 100 each 12  . insulin detemir (LEVEMIR) 100 UNIT/ML injection Inject 0.3 mLs (30 Units total) into the skin daily. (Patient not taking: Reported on 10/14/2018) 10 mL 11  . Insulin Pen Needle (B-D UF III MINI PEN NEEDLES) 31G X 5 MM MISC Use as instructed (Patient not taking: Reported on 07/01/2018) 90 each 1  . Insulin Syringes, Disposable, U-100 0.3 ML MISC 100 Syringes by Does not apply route 3 (three) times daily with meals as needed. (Patient not taking: Reported on 10/14/2018) 100 each 6  . lisinopril (PRINIVIL,ZESTRIL) 10 MG tablet Take 1 tablet (10 mg total) by mouth daily. (Patient not taking: Reported on 10/14/2018) 90 tablet 3  . sildenafil (VIAGRA) 25 MG tablet Take 1 tablet (25 mg total) by mouth daily as needed for erectile dysfunction. (Patient not taking: Reported on 10/14/2018) 10 tablet 3  . triamcinolone cream (KENALOG) 0.1 % Apply 1 application topically 2 (two) times daily. (Patient not taking: Reported on 10/14/2018) 30 g 3  . insulin lispro (HUMALOG) 100 UNIT/ML injection Inject 10 units under skin 3 times a day with meal, for blood glucose levels > 125. (Patient not taking: Reported on 10/14/2018) 10 mL 3  . Lancets (ACCU-CHEK SOFT TOUCH) lancets Use as instructed (Patient not taking: Reported on 10/14/2018) 100 each 12  . TRUEPLUS LANCETS 28G MISC Use as instructed (Patient not taking: Reported on 07/01/2018) 100 each 3   No facility-administered medications prior to visit.     Allergies  Allergen Reactions  . Sulfa Antibiotics Rash    Severe rash. Do not give sulfa medications  . Bactrim [Sulfamethoxazole-Trimethoprim]     Rash  . Cephalexin     Patient with severe drug reaction including exfoliating skin rash and hypotension  after being prescribed both cephalexin and sulfamethoxazole-trimethoprim simultaneously. Favor SMX as much more likely culprit as patient had tolerated other cephalosporins in the past, but cannot say with complete certainty that this was not related to cephalexin.     ROS Review of Systems  Constitutional: Negative.   HENT: Negative.   Eyes: Positive for visual disturbance (blurry vision).  Respiratory: Negative.   Cardiovascular: Negative.   Gastrointestinal: Negative.   Endocrine: Negative.   Genitourinary: Negative.   Musculoskeletal: Negative.   Skin: Negative.   Allergic/Immunologic: Negative.   Neurological: Positive for dizziness and headaches.  Hematological: Negative.   Psychiatric/Behavioral: Negative.       Objective:  Physical Exam  Constitutional: He is oriented to person, place, and time. He appears well-developed and well-nourished.  HENT:  Head: Normocephalic and atraumatic.  Eyes: Conjunctivae are normal.  Neck: Normal range of motion. Neck supple.  Cardiovascular: Normal rate, regular rhythm, normal heart sounds and intact distal pulses.  Pulmonary/Chest: Effort normal and breath sounds normal.  Abdominal: Soft. Bowel sounds are normal.  Musculoskeletal: Normal range of motion.  Neurological: He is alert and oriented to person, place, and time. He has normal reflexes.  Skin: Skin is warm and dry.  Psychiatric: He has a normal mood and affect. His behavior is normal. Judgment and thought content normal.  Nursing note and vitals reviewed.   BP (!) 142/86   Pulse 80   Temp 98.2 F (36.8 C) (Oral)   Ht 5' 7"  (1.702 m)   Wt 154 lb (69.9 kg)   SpO2 99%   BMI 24.12 kg/m  Wt Readings from Last 3 Encounters:  10/14/18 154 lb (69.9 kg)  07/29/18 155 lb (70.3 kg)  07/01/18 156 lb (70.8 kg)     Health Maintenance Due  Topic Date Due  . OPHTHALMOLOGY EXAM  03/19/1983  . FOOT EXAM  08/20/2017  . LIPID PANEL  06/10/2018    There are no preventive  care reminders to display for this patient.  Lab Results  Component Value Date   TSH 0.788 10/22/2011   Lab Results  Component Value Date   WBC 6.4 05/25/2018   HGB 13.1 05/25/2018   HCT 40.3 05/25/2018   MCV 84.8 05/25/2018   PLT 300 05/25/2018   Lab Results  Component Value Date   NA 134 (L) 05/25/2018   K 4.0 05/25/2018   CO2 27 05/25/2018   GLUCOSE 574 (HH) 05/25/2018   BUN 14 05/25/2018   CREATININE 1.25 (H) 05/25/2018   BILITOT 0.8 07/08/2015   ALKPHOS 85 07/08/2015   AST 24 07/08/2015   ALT 18 07/08/2015   PROT 7.7 07/08/2015   ALBUMIN 3.7 07/08/2015   CALCIUM 8.6 (L) 05/25/2018   ANIONGAP 12 05/25/2018   Lab Results  Component Value Date   CHOL 159 06/10/2017   Lab Results  Component Value Date   HDL 36 (L) 06/10/2017   Lab Results  Component Value Date   LDLCALC 88 06/10/2017   Lab Results  Component Value Date   TRIG 177 (H) 06/10/2017   Lab Results  Component Value Date   CHOLHDL 4.4 06/10/2017   Lab Results  Component Value Date   HGBA1C 13.5 (A) 10/14/2018   Assessment & Plan:   1. Uncontrolled type 2 diabetes mellitus with peripheral neuropathy (H[facility]) Worsened. Hgb A1c is increased to 13.5 today, from 12.6 on 07/29/2018. He will began to take all anti-diabetic medication and insulin as prescribed. We will change Humalog dose to BID. He will continue to decrease foods/beverages high in sugars and carbs and follow Heart Healthy or DASH diet. Increase physical activity to at least 30 minutes cardio exercise daily.  - POCT glycosylated hemoglobin (Hb A1C) - POCT urinalysis dipstick - POCT glucose (manual entry) - insulin lispro (HUMALOG) 100 UNIT/ML injection; Inject 20 units under skin, 2 times a day.  Dispense: 10 mL; Refill: 3 - Lancets (ACCU-CHEK SOFT TOUCH) lancets; Use as instructed  Dispense: 100 each; Refill: 12 - TRUEplus Lancets 28G MISC; Use as instructed  Dispense: 100 each; Refill: 3  2. Elevated glucose Blood glucose level is  increased today. 20 units of insulin given to patient in office. He  will continue to decrease foods/beverages high in sugars and carbs and follow Heart Healthy or DASH diet. Increase physical activity to at least 30 minutes cardio exercise daily.  - insulin lispro (HUMALOG) injection 20 Units - insulin lispro (HUMALOG) 100 UNIT/ML injection; Inject 20 units under skin, 2 times a day.  Dispense: 10 mL; Refill: 3 - Lancets (ACCU-CHEK SOFT TOUCH) lancets; Use as instructed  Dispense: 100 each; Refill: 12 - TRUEplus Lancets 28G MISC; Use as instructed  Dispense: 100 each; Refill: 3  3. Abnormal urinalysis Results are pending.  - Urine Culture  4. Essential hypertension The current medical regimen is effective; blood pressure is stable at 142/86 today; continue present plan and medications as prescribed. He will continue to decrease high sodium intake, excessive alcohol intake, increase potassium intake, smoking cessation, and increase physical activity of at least 30 minutes of cardio activity daily. He will continue to follow Heart Healthy or DASH diet.  5. Follow up He will follow up in 1 month.   Meds ordered this encounter  Medications  . insulin lispro (HUMALOG) injection 20 Units  . insulin lispro (HUMALOG) 100 UNIT/ML injection    Sig: Inject 20 units under skin, 2 times a day.    Dispense:  10 mL    Refill:  3  . Lancets (ACCU-CHEK SOFT TOUCH) lancets    Sig: Use as instructed    Dispense:  100 each    Refill:  12  . TRUEplus Lancets 28G MISC    Sig: Use as instructed    Dispense:  100 each    Refill:  3     Orders Placed This Encounter  Procedures  . Urine Culture  . POCT glycosylated hemoglobin (Hb A1C)  . POCT urinalysis dipstick  . POCT glucose (manual entry)    Referral Orders  No referral(s) requested today    Kathe Becton,  MSN, FNP-BC Patient [place], [place] [phone]  Problem List  Items Addressed This Visit      Endocrine   Uncontrolled type 2 diabetes mellitus with peripheral neuropathy ([place]) - Primary   Relevant Medications   insulin lispro (HUMALOG) injection 20 Units (Completed)   insulin lispro (HUMALOG) 100 UNIT/ML injection   Lancets (ACCU-CHEK SOFT TOUCH) lancets   TRUEplus Lancets 28G MISC   Other Relevant Orders   POCT glycosylated hemoglobin (Hb A1C) (Completed)   POCT urinalysis dipstick (Completed)   POCT glucose (manual entry) (Completed)     Other   Elevated glucose   Relevant Medications   insulin lispro (HUMALOG) injection 20 Units (Completed)   insulin lispro (HUMALOG) 100 UNIT/ML injection   Lancets (ACCU-CHEK SOFT TOUCH) lancets   TRUEplus Lancets 28G MISC    Other Visit Diagnoses    Abnormal urinalysis       Relevant Orders   Urine Culture   Essential hypertension       Follow up          Meds ordered this encounter  Medications  . insulin lispro (HUMALOG) injection 20 Units  . insulin lispro (HUMALOG) 100 UNIT/ML injection    Sig: Inject 20 units under skin, 2 times a day.    Dispense:  10 mL    Refill:  3  . Lancets (ACCU-CHEK SOFT TOUCH) lancets    Sig: Use as instructed    Dispense:  100 each    Refill:  12  . TRUEplus Lancets 28G MISC    Sig:  Use as instructed    Dispense:  100 each    Refill:  3    Follow-up: Return in about 1 month (around 11/14/2018).    [name], FNP

## 2018-10-16 LAB — URINE CULTURE: Organism ID, Bacteria: NO GROWTH

## 2018-10-19 MED FILL — GABAPENTIN 300 MG CAPSULE: 300 | 30 days supply | Qty: 90 | Fill #1

## 2018-10-19 MED FILL — ?TRIAMCINOLONE 0.1% CRM: 0.1 | 15 days supply | Qty: 30 | Fill #1

## 2018-10-19 MED FILL — ?ATORVASTATIN 20 MG TABLET: 20 | 30 days supply | Qty: 30 | Fill #1

## 2018-10-19 MED FILL — !VIAGRA 25 MG TABLET: 25 | 30 days supply | Qty: 10 | Fill #2

## 2018-10-19 MED FILL — CYCLOBENZAPRINE 10 MG TAB: 10 | 15 days supply | Qty: 30 | Fill #1

## 2018-10-19 MED FILL — LISINOPRIL 10 MG TABS: 10 | 30 days supply | Qty: 30 | Fill #1

## 2018-10-19 MED FILL — TRUEPLUS SYR 0.3ML 31GX5/16: 31G X 5/16" | 30 days supply | Qty: 100 | Fill #1

## 2018-11-16 ENCOUNTER — Ambulatory Visit: Payer: Self-pay | Admitting: Family Medicine

## 2019-01-19 DIAGNOSIS — E559 Vitamin D deficiency, unspecified: Secondary | ICD-10-CM

## 2019-01-19 DIAGNOSIS — R748 Abnormal levels of other serum enzymes: Secondary | ICD-10-CM

## 2019-01-19 HISTORY — DX: Abnormal levels of other serum enzymes: R74.8

## 2019-01-19 HISTORY — DX: Vitamin D deficiency, unspecified: E55.9

## 2019-01-22 ENCOUNTER — Ambulatory Visit (INDEPENDENT_AMBULATORY_CARE_PROVIDER_SITE_OTHER): Payer: Self-pay | Admitting: Family Medicine

## 2019-01-22 ENCOUNTER — Other Ambulatory Visit: Payer: Self-pay

## 2019-01-22 VITALS — BP 148/86 | HR 101 | Temp 98.9°F | Ht 67.0 in | Wt 147.0 lb

## 2019-01-22 DIAGNOSIS — Z1211 Encounter for screening for malignant neoplasm of colon: Secondary | ICD-10-CM

## 2019-01-22 DIAGNOSIS — IMO0002 Reserved for concepts with insufficient information to code with codable children: Secondary | ICD-10-CM

## 2019-01-22 DIAGNOSIS — E1142 Type 2 diabetes mellitus with diabetic polyneuropathy: Secondary | ICD-10-CM

## 2019-01-22 DIAGNOSIS — I10 Essential (primary) hypertension: Secondary | ICD-10-CM

## 2019-01-22 DIAGNOSIS — Z23 Encounter for immunization: Secondary | ICD-10-CM

## 2019-01-22 DIAGNOSIS — N529 Male erectile dysfunction, unspecified: Secondary | ICD-10-CM

## 2019-01-22 DIAGNOSIS — E1165 Type 2 diabetes mellitus with hyperglycemia: Secondary | ICD-10-CM

## 2019-01-22 DIAGNOSIS — Z09 Encounter for follow-up examination after completed treatment for conditions other than malignant neoplasm: Secondary | ICD-10-CM

## 2019-01-22 LAB — POCT GLUCOSE (DEVICE FOR HOME USE)
Glucose Fasting, POC: 221 mg/dL — AB (ref 70–99)
POC Glucose: 221 mg/dl — AB (ref 70–99)

## 2019-01-22 LAB — POCT GLYCOSYLATED HEMOGLOBIN (HGB A1C)

## 2019-01-22 MED ORDER — METFORMIN HCL 1000 MG PO TABS: 1000.0000 mg | ORAL_TABLET | Freq: Two times a day (BID) | ORAL | 3 refills | Status: DC

## 2019-01-22 MED ORDER — SILDENAFIL CITRATE 25 MG PO TABS: 25.0000 mg | ORAL_TABLET | ORAL | 3 refills | Status: DC | PRN

## 2019-01-22 MED FILL — metFORMIN HCL 1000 MG TABS: 1000 | 30 days supply | Qty: 60 | Fill #0

## 2019-01-22 MED FILL — SILDENAFIL 25 MG TABLET: 25 | 30 days supply | Qty: 10 | Fill #0

## 2019-01-22 NOTE — Progress Notes (Signed)
Patient [facility]  Established Patient Office Visit  Subjective:  Patient ID: [name], male    DOB: Oct 03, 1970  Age: 48 y.o. MRN: [id number]  CC:  Chief Complaint  Patient presents with  . Follow-up    rt foot red spot on toe,notice it about week ago no pain ,no injury     HPI [name] is a 48 year old male who presents for Follow Up today.   Past Medical History:  Diagnosis Date  . Diabetes mellitus type II, uncontrolled ([place]) 07/17/2006  . ESSENTIAL HYPERTENSION 07/17/2006  . HYPERLIPIDEMIA 11/24/2008  . Pneumonia 10/22/2011    Current Status: Since his last office visit, he is doing well with no complaints. He denies visual changes, chest pain, cough, shortness of breath, heart palpitations, and falls. He has occasional headaches and dizziness with position changes. Denies severe headaches, confusion, seizures, double vision, and blurred vision, nausea and vomiting. His most recent normal range of preprandial blood glucose levels have been between 250-350. He has seen low range of 112 and high of 325 since his last office visit. He denies fatigue, frequent urination, blurred vision, excessive hunger, excessive thirst, weight gain, weight loss, and poor wound healing. He continues to check his feet regularly. He reports a small sore on the side of his right great toe, which he just discovered a few days ago. He states that he does not have any pain or discomfort in his toe, only mild numbness and tingling in all toes. His anxiety is mild today. He denies suicidal ideations, homicidal ideations, or auditory hallucinations.   He denies fevers, chills, fatigue, recent infections, weight loss, and night sweats.  No reports of GI problems such as diarrhea, and constipation. He has no reports of blood in stools, dysuria and hematuria. He denies pain today.   Past Surgical History:  Procedure Laterality Date  . ARM SURGERY       Family History  Problem Relation Age of Onset  . Hypertension Mother 98  . Cancer Mother        bladder cancer  . Alcohol abuse Father   . Hypertension Brother     Social History   Socioeconomic History  . Marital status: Married    Spouse name: Not on file  . Number of children: Not on file  . Years of education: Not on file  . Highest education level: Not on file  Occupational History  . 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 Occupation: Biochemist, clinical    Comment: Updated June 2013  . Occupation: Engineer, civil (consulting)    Comment: Began Jan 2014  Social Needs  . Financial resource strain: Not on file  . Food insecurity    Worry: Not on file    Inability: Not on file  . Transportation needs    Medical: Not on file    Non-medical: Not on file  Tobacco Use  . Smoking status: Never Smoker  . Smokeless tobacco: Never Used  Substance and Sexual Activity  . Alcohol use: No    Alcohol/week: 0.0 standard drinks  . Drug use: No  . Sexual activity: Yes    Partners: Female  Lifestyle  . Physical activity    Days per week: Not on file    Minutes per session: Not on file  . Stress: Not on file  Relationships  . Social connections    Talks on phone: Not on file  Gets together: Not on file    Attends religious service: Not on file    Active member of club or organization: Not on file    Attends meetings of clubs or organizations: Not on file    Relationship status: Not on file  . Intimate partner violence    Fear of current or ex partner: Not on file    Emotionally abused: Not on file    Physically abused: Not on file    Forced sexual activity: Not on file  Other Topics Concern  . Not on file  Social History Narrative   Lives with his mother and brother.   Wife has had a foot amputation; is a type 1 DM.    Outpatient Medications Prior to Visit  Medication Sig Dispense Refill  . atorvastatin (LIPITOR) 20 MG tablet Take 1 tablet (20 mg total) by mouth daily. 90  tablet 3  . Blood Glucose Monitoring Suppl (TRUE METRIX METER) w/Device KIT Use as instructed 1 kit 0  . cyclobenzaprine (FLEXERIL) 10 MG tablet Take 1 tablet (10 mg total) by mouth 2 (two) times daily as needed for muscle spasms. 30 tablet 3  . gabapentin (NEURONTIN) 300 MG capsule Take 1 capsule (300 mg total) by mouth 3 (three) times daily. 90 capsule 3  . glucose blood test strip Use to check fasting blood sugar once daily. 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 Insulin dependent 100 each 12  . insulin detemir (LEVEMIR) 100 UNIT/ML injection Inject 0.3 mLs (30 Units total) into the skin daily. 10 mL 11  . insulin lispro (HUMALOG) 100 UNIT/ML injection Inject 20 units under skin, 2 times a day. 10 mL 3  . Insulin Pen Needle (B-D UF III MINI PEN NEEDLES) 31G X 5 MM MISC Use as instructed 90 each 1  . Insulin Syringes, Disposable, U-100 0.3 ML MISC 100 Syringes by Does not apply route 3 (three) times daily with meals as needed. 100 each 6  . Lancets (ACCU-CHEK SOFT TOUCH) lancets Use as instructed 100 each 12  . lisinopril (PRINIVIL,ZESTRIL) 10 MG tablet Take 1 tablet (10 mg total) by mouth daily. 90 tablet 3  . triamcinolone cream (KENALOG) 0.1 % Apply 1 application topically 2 (two) times daily. 30 g 3  . TRUEplus Lancets 28G MISC Use as instructed 100 each 3  . sildenafil (VIAGRA) 25 MG tablet Take 1 tablet (25 mg total) by mouth daily as needed for erectile dysfunction. 10 tablet 3   No facility-administered medications prior to visit.     Allergies  Allergen Reactions  . Sulfa Antibiotics Rash    Severe rash. Do not give sulfa medications  . Bactrim [Sulfamethoxazole-Trimethoprim]     Rash  . Cephalexin     Patient with severe drug reaction including exfoliating skin rash and hypotension after being prescribed both cephalexin and sulfamethoxazole-trimethoprim simultaneously. Favor SMX as much more likely culprit as patient had tolerated other cephalosporins in the past, but cannot say with complete  certainty that this was not related to cephalexin.     ROS Review of Systems  Constitutional: Positive for fatigue.  HENT: Negative.   Eyes: Negative.   Respiratory: Negative.   Cardiovascular: Negative.   Gastrointestinal: Negative.   Endocrine: Negative.   Genitourinary: Negative.   Musculoskeletal: Negative.   Skin: Negative.   Neurological: Positive for dizziness (occasional) and headaches (occasional).  Hematological: Negative.   Psychiatric/Behavioral: Negative.     Objective:    Physical Exam  Constitutional: He is oriented to person, place, and time.  He appears well-developed and well-nourished.  HENT:  Head: Normocephalic and atraumatic.  Eyes: Conjunctivae are normal.  Neck: Normal range of motion. Neck supple.  Cardiovascular: Normal rate, regular rhythm, normal heart sounds and intact distal pulses.  Pulmonary/Chest: Effort normal and breath sounds normal.  Abdominal: Soft. Bowel sounds are normal.  Musculoskeletal: Normal range of motion.  Neurological: He is alert and oriented to person, place, and time. He has normal reflexes.  Skin: Skin is warm and dry.     Psychiatric: He has a normal mood and affect. His behavior is normal. Judgment and thought content normal.  Nursing note and vitals reviewed.   BP (!) 148/86 (BP Location: Right Arm) Comment: manually  Pulse (!) 101   Temp 98.9 F (37.2 C) (Oral)   Ht _0  (1.702 m)   Wt 147 lb (66.7 kg)   BMI 23.02 kg/m  Wt Readings from Last 3 Encounters:  01/22/19 147 lb (66.7 kg)  10/14/18 154 lb (69.9 kg)  07/29/18 155 lb (70.3 kg)     Health Maintenance Due  Topic Date Due  . OPHTHALMOLOGY EXAM  03/19/1983  . FOOT EXAM  08/20/2017  . LIPID PANEL  06/10/2018  . INFLUENZA VACCINE  12/19/2018  . HEMOGLOBIN A1C  01/14/2019    There are no preventive care reminders to display for this patient.  Lab Results  Component Value Date   TSH 0.788 10/22/2011   Lab Results  Component Value Date    WBC 6.4 05/25/2018   HGB 13.1 05/25/2018   HCT 40.3 05/25/2018   MCV 84.8 05/25/2018   PLT 300 05/25/2018   Lab Results  Component Value Date   NA 134 (L) 05/25/2018   K 4.0 05/25/2018   CO2 27 05/25/2018   GLUCOSE 574 (HH) 05/25/2018   BUN 14 05/25/2018   CREATININE 1.25 (H) 05/25/2018   BILITOT 0.8 07/08/2015   ALKPHOS 85 07/08/2015   AST 24 07/08/2015   ALT 18 07/08/2015   PROT 7.7 07/08/2015   ALBUMIN 3.7 07/08/2015   CALCIUM 8.6 (L) 05/25/2018   ANIONGAP 12 05/25/2018   Lab Results  Component Value Date   CHOL 159 06/10/2017   Lab Results  Component Value Date   HDL 36 (L) 06/10/2017   Lab Results  Component Value Date   LDLCALC 88 06/10/2017   Lab Results  Component Value Date   TRIG 177 (H) 06/10/2017   Lab Results  Component Value Date   CHOLHDL 4.4 06/10/2017   Lab Results  Component Value Date   HGBA1C 13.5 (A) 10/14/2018      Assessment & Plan:   1. Uncontrolled type 2 diabetes mellitus with peripheral neuropathy (HCC) Blood glucose level is elevated at 214 today. Unable to read Hgb A1c from machine today. We will re-evaluate Hgb A1c and CBG in one week. He will continue to decrease foods/beverages high in sugars and carbs and follow Heart Healthy or DASH diet. Increase physical activity to at least 30 minutes cardio exercise daily.  - POCT Urinalysis Dipstick - HgB A1c - POCT Glucose (Device for Home Use) - metFORMIN (GLUCOPHAGE) 1000 MG tablet; Take 1 tablet (1,000 mg total) by mouth 2 (two) times daily with a meal.  Dispense: 180 tablet; Refill: 3  2. Essential hypertension The current medical regimen is effective; blood pressure is stable at 148/86 today; continue present plan and medications as prescribed. He will continue to decrease high sodium intake, excessive alcohol intake, increase potassium intake, smoking cessation, and increase physical  activity of at least 30 minutes of cardio activity daily. He will continue to follow Heart  Healthy or DASH diet. - CBC with Differential - Comprehensive metabolic panel - TSH - PSA - Vitamin B12 - Vitamin D, 25-hydroxy  3. Colon cancer screening - Ambulatory referral to Gastroenterology  4. Erectile dysfunction, unspecified erectile dysfunction type - sildenafil (VIAGRA) 25 MG tablet; Take 1 tablet (25 mg total) by mouth as needed for erectile dysfunction. for erectile dysfunction.  Dispense: 30 tablet; Refill: 3  5. Need for immunization against influenza - Flu Vaccine QUAD 6+ mos PF IM (Fluarix Quad PF)  6. Follow up He will follow up in 1 week for labs only, for Hgb A1c and CBG.  He will follow up in 1 month for office visit.   Meds ordered this encounter  Medications  . metFORMIN (GLUCOPHAGE) 1000 MG tablet    Sig: Take 1 tablet (1,000 mg total) by mouth 2 (two) times daily with a meal.    Dispense:  180 tablet    Refill:  3  . sildenafil (VIAGRA) 25 MG tablet    Sig: Take 1 tablet (25 mg total) by mouth as needed for erectile dysfunction. for erectile dysfunction.    Dispense:  30 tablet    Refill:  3    Orders Placed This Encounter  Procedures  . Flu Vaccine QUAD 6+ mos PF IM (Fluarix Quad PF)  . CBC with Differential  . Comprehensive metabolic panel  . TSH  . PSA  . Vitamin B12  . Vitamin D, 25-hydroxy  . Ambulatory referral to Gastroenterology  . POCT Urinalysis Dipstick  . HgB A1c  . POCT Glucose (Device for Home Use)     Referral Orders     Ambulatory referral to Gastroenterology   Kathe Becton,  MSN, FNP-BC [facility] [address] [place], [place] [address] [phone] fax     Problem List Items Addressed This Visit      Endocrine   Uncontrolled type 2 diabetes mellitus with peripheral neuropathy ([place]) - Primary   Relevant Medications   metFORMIN (GLUCOPHAGE) 1000 MG tablet   Other Relevant Orders   POCT Urinalysis Dipstick   HgB A1c  (Completed)   POCT Glucose (Device for Home Use) (Completed)    Other Visit Diagnoses    Essential hypertension       Relevant Medications   sildenafil (VIAGRA) 25 MG tablet   Other Relevant Orders   CBC with Differential   Comprehensive metabolic panel   TSH   PSA   Vitamin B12   Vitamin D, 25-hydroxy   Colon cancer screening       Relevant Orders   Ambulatory referral to Gastroenterology   Erectile dysfunction, unspecified erectile dysfunction type       Relevant Medications   sildenafil (VIAGRA) 25 MG tablet   Need for immunization against influenza       Relevant Orders   Flu Vaccine QUAD 6+ mos PF IM (Fluarix Quad PF) (Completed)   Follow up          Meds ordered this encounter  Medications  . metFORMIN (GLUCOPHAGE) 1000 MG tablet    Sig: Take 1 tablet (1,000 mg total) by mouth 2 (two) times daily with a meal.    Dispense:  180 tablet    Refill:  3  . sildenafil (VIAGRA) 25 MG tablet    Sig: Take 1 tablet (25 mg total) by  mouth as needed for erectile dysfunction. for erectile dysfunction.    Dispense:  30 tablet    Refill:  3    Follow-up: No follow-ups on file.    [name], FNP

## 2019-01-23 LAB — COMPREHENSIVE METABOLIC PANEL
ALT: 10 IU/L (ref 0–44)
AST: 15 IU/L (ref 0–40)
Albumin/Globulin Ratio: 1.1 — ABNORMAL LOW (ref 1.2–2.2)
Albumin: 3.7 g/dL — ABNORMAL LOW (ref 4.0–5.0)
Alkaline Phosphatase: 144 IU/L — ABNORMAL HIGH (ref 39–117)
BUN/Creatinine Ratio: 12 (ref 9–20)
BUN: 16 mg/dL (ref 6–24)
Bilirubin Total: <0.2 mg/dL (ref 0.0–1.2)
CO2: 25 mmol/L (ref 20–29)
Calcium: 9.8 mg/dL (ref 8.7–10.2)
Chloride: 100 mmol/L (ref 96–106)
Creatinine, Ser: 1.31 mg/dL — ABNORMAL HIGH (ref 0.76–1.27)
GFR calc Af Amer: 75 mL/min/{1.73_m2} (ref >59)
GFR calc non Af Amer: 65 mL/min/{1.73_m2} (ref >59)
Globulin, Total: 3.3 g/dL (ref 1.5–4.5)
Glucose: 85 mg/dL (ref 65–99)
Potassium: 3.7 mmol/L (ref 3.5–5.2)
Sodium: 140 mmol/L (ref 134–144)
Total Protein: 7 g/dL (ref 6.0–8.5)

## 2019-01-23 LAB — CBC WITH DIFFERENTIAL/PLATELET
Basophils Absolute: 0 10*3/uL (ref 0.0–0.2)
Basos: 1 %
EOS (ABSOLUTE): 0.2 10*3/uL (ref 0.0–0.4)
Eos: 3 %
Hematocrit: 39.1 % (ref 37.5–51.0)
Hemoglobin: 12.9 g/dL — ABNORMAL LOW (ref 13.0–17.7)
Immature Grans (Abs): 0 10*3/uL (ref 0.0–0.1)
Immature Granulocytes: 0 %
Lymphocytes Absolute: 2 10*3/uL (ref 0.7–3.1)
Lymphs: 40 %
MCH: 26.9 pg (ref 26.6–33.0)
MCHC: 33 g/dL (ref 31.5–35.7)
MCV: 82 fL (ref 79–97)
Monocytes Absolute: 0.5 10*3/uL (ref 0.1–0.9)
Monocytes: 10 %
Neutrophils Absolute: 2.2 10*3/uL (ref 1.4–7.0)
Neutrophils: 46 %
Platelets: 325 10*3/uL (ref 150–450)
RBC: 4.8 x10E6/uL (ref 4.14–5.80)
RDW: 13.3 % (ref 11.6–15.4)
WBC: 4.9 10*3/uL (ref 3.4–10.8)

## 2019-01-23 LAB — PSA: Prostate Specific Ag, Serum: 1.4 ng/mL (ref 0.0–4.0)

## 2019-01-23 LAB — TSH: TSH: 2.47 u[IU]/mL (ref 0.450–4.500)

## 2019-01-23 LAB — VITAMIN D 25 HYDROXY (VIT D DEFICIENCY, FRACTURES): Vit D, 25-Hydroxy: 13.2 ng/mL — ABNORMAL LOW (ref 30.0–100.0)

## 2019-01-23 LAB — VITAMIN B12: Vitamin B-12: 783 pg/mL (ref 232–1245)

## 2019-01-24 ENCOUNTER — Encounter: Payer: Self-pay | Admitting: Family Medicine

## 2019-01-24 ENCOUNTER — Other Ambulatory Visit: Payer: Self-pay | Admitting: Family Medicine

## 2019-01-24 DIAGNOSIS — E1142 Type 2 diabetes mellitus with diabetic polyneuropathy: Secondary | ICD-10-CM

## 2019-01-24 DIAGNOSIS — IMO0002 Reserved for concepts with insufficient information to code with codable children: Secondary | ICD-10-CM

## 2019-01-24 DIAGNOSIS — E559 Vitamin D deficiency, unspecified: Secondary | ICD-10-CM

## 2019-01-24 MED ORDER — VITAMIN D (ERGOCALCIFEROL) 1.25 MG (50000 UNIT) PO CAPS: 50000.0000 [IU] | ORAL_CAPSULE | ORAL | 6 refills | Status: DC

## 2019-01-26 ENCOUNTER — Telehealth: Payer: Self-pay

## 2019-01-26 MED ORDER — TRUE METRIX METER W/DEVICE KIT: PACK | 0 refills | Status: DC

## 2019-01-26 NOTE — Telephone Encounter (Signed)
Spoke with pt and inform of his lab results, and he said that his not having pain in his stomach anymore.Pt is following up with an appt on Friday.

## 2019-01-28 ENCOUNTER — Other Ambulatory Visit: Payer: Self-pay

## 2019-01-28 DIAGNOSIS — Z20822 Contact with and (suspected) exposure to covid-19: Secondary | ICD-10-CM

## 2019-01-29 ENCOUNTER — Other Ambulatory Visit: Payer: Self-pay | Admitting: Family Medicine

## 2019-01-29 ENCOUNTER — Other Ambulatory Visit: Payer: Self-pay

## 2019-01-29 ENCOUNTER — Encounter: Payer: Self-pay | Admitting: Family Medicine

## 2019-01-29 ENCOUNTER — Other Ambulatory Visit (INDEPENDENT_AMBULATORY_CARE_PROVIDER_SITE_OTHER): Payer: Self-pay | Admitting: Family Medicine

## 2019-01-29 VITALS — BP 140/90 | HR 70

## 2019-01-29 DIAGNOSIS — Z09 Encounter for follow-up examination after completed treatment for conditions other than malignant neoplasm: Secondary | ICD-10-CM

## 2019-01-29 DIAGNOSIS — IMO0002 Reserved for concepts with insufficient information to code with codable children: Secondary | ICD-10-CM

## 2019-01-29 DIAGNOSIS — Z91148 Patient's other noncompliance with medication regimen for other reason: Secondary | ICD-10-CM

## 2019-01-29 DIAGNOSIS — E1142 Type 2 diabetes mellitus with diabetic polyneuropathy: Secondary | ICD-10-CM

## 2019-01-29 DIAGNOSIS — E1165 Type 2 diabetes mellitus with hyperglycemia: Secondary | ICD-10-CM

## 2019-01-29 DIAGNOSIS — Z9114 Patient's other noncompliance with medication regimen: Secondary | ICD-10-CM

## 2019-01-29 DIAGNOSIS — I10 Essential (primary) hypertension: Secondary | ICD-10-CM

## 2019-01-29 DIAGNOSIS — R7309 Other abnormal glucose: Secondary | ICD-10-CM

## 2019-01-29 LAB — POCT URINALYSIS DIPSTICK
Bilirubin, UA: NEGATIVE
Glucose, UA: POSITIVE — AB
Ketones, UA: NEGATIVE
Leukocytes, UA: NEGATIVE
Nitrite, UA: NEGATIVE
Protein, UA: POSITIVE — AB
Spec Grav, UA: 1.02 (ref 1.010–1.025)
Urobilinogen, UA: 0.2 E.U./dL
pH, UA: 6.5 (ref 5.0–8.0)

## 2019-01-29 LAB — POCT GLYCOSYLATED HEMOGLOBIN (HGB A1C)

## 2019-01-29 LAB — GLUCOSE, POCT (MANUAL RESULT ENTRY): POC Glucose: 412 mg/dl — AB (ref 70–99)

## 2019-01-29 LAB — NOVEL CORONAVIRUS, NAA: SARS-CoV-2, NAA: NOT DETECTED

## 2019-01-29 LAB — POCT CBG (FASTING - GLUCOSE)-MANUAL ENTRY: Glucose Fasting, POC: 419 mg/dL — AB (ref 70–99)

## 2019-01-29 MED ORDER — INSULIN LISPRO 100 UNIT/ML ~~LOC~~ SOLN
10.0000 [IU] | Freq: Once | SUBCUTANEOUS | Status: AC
  Administered 2019-01-29: 10 [IU] via SUBCUTANEOUS

## 2019-01-29 MED ORDER — TRUE METRIX METER W/DEVICE KIT: PACK | 0 refills | Status: DC

## 2019-01-29 NOTE — Addendum Note (Signed)
Addended by: [name] on: 01/29/2019 12:37 PM   Modules accepted: Orders

## 2019-01-29 NOTE — Addendum Note (Signed)
Addended by: Tyrone Apple on: 01/29/2019 10:16 AM   Modules accepted: Orders

## 2019-01-29 NOTE — Addendum Note (Signed)
Addended by: Adelina Mings on: 01/29/2019 09:47 AM   Modules accepted: Orders

## 2019-01-29 NOTE — Progress Notes (Addendum)
Patient [name]   Blood Glucose Recheck  Subjective:  Patient ID: [name], male    DOB: [birth year]  Age: 48 y.o. MRN: [id number]  CC: No chief complaint on file.   HPI [name] is a 48 year old male who presents for follow up for Blood Glucose Recheck today.   Past Medical History:  Diagnosis Date  . Diabetes mellitus type II, uncontrolled ([place]) 07/17/2006  . Elevated alkaline phosphatase level 01/2019  . ESSENTIAL HYPERTENSION 07/17/2006  . HYPERLIPIDEMIA 11/24/2008  . Pneumonia 10/22/2011  . Vitamin D deficiency 01/2019   Current Status: Since his last office visit, he states that she has not taking diabetic medications as of yet this morning. He states that he has not been able to afford Metformin. He has not been monitoring his blood glucose levels within the last week. He denies fatigue, frequent urination, blurred vision, excessive hunger, excessive thirst, weight gain, weight loss, and poor wound healing. He continues to check his feet regularly. He denies visual changes, chest pain, cough, shortness of breath, heart palpitations, and falls. He has occasional headaches and dizziness with position changes. Denies severe headaches, confusion, seizures, double vision, and blurred vision, nausea and vomiting.  He denies fevers, chills, fatigue, recent infections, weight loss, and night sweats. No reports of GI problems such as diarrhea, and constipation. He has no reports of blood in stools, dysuria and hematuria. No depression or anxiety reported. He denies pain today.   Past Surgical History:  Procedure Laterality Date  . ARM SURGERY      Family History  Problem Relation Age of Onset  . Hypertension Mother 81  . Cancer Mother        bladder cancer  . Alcohol abuse Father   . Hypertension Brother     Social History   Socioeconomic History  . Marital status: Married    Spouse name: Not on file  . Number of  children: Not on file  . Years of education: Not on file  . Highest education level: Not on file  Occupational History  . 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 Occupation: Biochemist, clinical    Comment: Updated June 2013  . Occupation: Engineer, civil (consulting)    Comment: Began Jan 2014  Social Needs  . Financial resource strain: Not on file  . Food insecurity    Worry: Not on file    Inability: Not on file  . Transportation needs    Medical: Not on file    Non-medical: Not on file  Tobacco Use  . Smoking status: Never Smoker  . Smokeless tobacco: Never Used  Substance and Sexual Activity  . Alcohol use: No    Alcohol/week: 0.0 standard drinks  . Drug use: No  . Sexual activity: Yes    Partners: Female  Lifestyle  . Physical activity    Days per week: Not on file    Minutes per session: Not on file  . Stress: Not on file  Relationships  . Social Herbalist on phone: Not on file    Gets together: Not on file    Attends religious service: Not on file    Active member of club or organization: Not on file    Attends meetings of clubs or organizations: Not on file    Relationship status: Not on file  . Intimate partner violence    Fear of current or ex partner: Not  on file    Emotionally abused: Not on file    Physically abused: Not on file    Forced sexual activity: Not on file  Other Topics Concern  . Not on file  Social History Narrative   Lives with his mother and brother.   Wife has had a foot amputation; is a type 1 DM.    Outpatient Medications Prior to Visit  Medication Sig Dispense Refill  . atorvastatin (LIPITOR) 20 MG tablet Take 1 tablet (20 mg total) by mouth daily. 90 tablet 3  . cyclobenzaprine (FLEXERIL) 10 MG tablet Take 1 tablet (10 mg total) by mouth 2 (two) times daily as needed for muscle spasms. 30 tablet 3  . gabapentin (NEURONTIN) 300 MG capsule Take 1 capsule (300 mg total) by mouth 3 (three) times daily. 90 capsule 3  . glucose blood test  strip Use to check fasting blood sugar once daily. 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 Insulin dependent 100 each 12  . insulin detemir (LEVEMIR) 100 UNIT/ML injection Inject 0.3 mLs (30 Units total) into the skin daily. 10 mL 11  . insulin lispro (HUMALOG) 100 UNIT/ML injection Inject 20 units under skin, 2 times a day. 10 mL 3  . Insulin Pen Needle (B-D UF III MINI PEN NEEDLES) 31G X 5 MM MISC Use as instructed 90 each 1  . Insulin Syringes, Disposable, U-100 0.3 ML MISC 100 Syringes by Does not apply route 3 (three) times daily with meals as needed. 100 each 6  . Lancets (ACCU-CHEK SOFT TOUCH) lancets Use as instructed 100 each 12  . lisinopril (PRINIVIL,ZESTRIL) 10 MG tablet Take 1 tablet (10 mg total) by mouth daily. 90 tablet 3  . metFORMIN (GLUCOPHAGE) 1000 MG tablet Take 1 tablet (1,000 mg total) by mouth 2 (two) times daily with a meal. 180 tablet 3  . sildenafil (VIAGRA) 25 MG tablet Take 1 tablet (25 mg total) by mouth as needed for erectile dysfunction. for erectile dysfunction. 30 tablet 3  . triamcinolone cream (KENALOG) 0.1 % Apply 1 application topically 2 (two) times daily. 30 g 3  . TRUEplus Lancets 28G MISC Use as instructed 100 each 3  . Vitamin D, Ergocalciferol, (DRISDOL) 1.25 MG (50000 UT) CAPS capsule Take 1 capsule (50,000 Units total) by mouth every 7 (seven) days. 5 capsule 6  . Blood Glucose Monitoring Suppl (TRUE METRIX METER) w/Device KIT Use as instructed 1 kit 0   No facility-administered medications prior to visit.     Allergies  Allergen Reactions  . Sulfa Antibiotics Rash    Severe rash. Do not give sulfa medications  . Bactrim [Sulfamethoxazole-Trimethoprim]     Rash  . Cephalexin     Patient with severe drug reaction including exfoliating skin rash and hypotension after being prescribed both cephalexin and sulfamethoxazole-trimethoprim simultaneously. Favor SMX as much more likely culprit as patient had tolerated other cephalosporins in the past, but cannot say with  complete certainty that this was not related to cephalexin.     ROS Review of Systems  HENT: Negative.   Respiratory: Negative.   Cardiovascular: Negative.   Musculoskeletal: Negative.   Neurological: Positive for dizziness (occasional) and headaches (occasional).      Objective:    Physical Exam  Constitutional: He is oriented to person, place, and time. He appears well-developed and well-nourished.  Eyes: Conjunctivae are normal.  Neck: Normal range of motion. Neck supple.  Cardiovascular: Normal rate, regular rhythm, normal heart sounds and intact distal pulses.  Pulmonary/Chest: Effort normal and breath sounds  normal.  Abdominal: Soft. Bowel sounds are normal.  Musculoskeletal: Normal range of motion.  Neurological: He is alert and oriented to person, place, and time. He has normal reflexes.  Skin: Skin is warm and dry.  Psychiatric: He has a normal mood and affect. His behavior is normal. Judgment and thought content normal.    BP 140/90 (BP Location: Left Arm, Patient Position: Sitting, Cuff Size: Normal)   Pulse 70  Wt Readings from Last 3 Encounters:  01/22/19 147 lb (66.7 kg)  10/14/18 154 lb (69.9 kg)  07/29/18 155 lb (70.3 kg)     Health Maintenance Due  Topic Date Due  . OPHTHALMOLOGY EXAM  03/19/1983  . FOOT EXAM  08/20/2017  . LIPID PANEL  06/10/2018  . HEMOGLOBIN A1C  01/14/2019    There are no preventive Anderson reminders to display for this patient.  Lab Results  Component Value Date   TSH 2.470 01/22/2019   Lab Results  Component Value Date   WBC 4.9 01/22/2019   HGB 12.9 (L) 01/22/2019   HCT 39.1 01/22/2019   MCV 82 01/22/2019   PLT 325 01/22/2019   Lab Results  Component Value Date   NA 140 01/22/2019   K 3.7 01/22/2019   CO2 25 01/22/2019   GLUCOSE 85 01/22/2019   BUN 16 01/22/2019   CREATININE 1.31 (H) 01/22/2019   BILITOT <0.2 01/22/2019   ALKPHOS 144 (H) 01/22/2019   AST 15 01/22/2019   ALT 10 01/22/2019   PROT 7.0 01/22/2019    ALBUMIN 3.7 (L) 01/22/2019   CALCIUM 9.8 01/22/2019   ANIONGAP 12 05/25/2018   Lab Results  Component Value Date   CHOL 159 06/10/2017   Lab Results  Component Value Date   HDL 36 (L) 06/10/2017   Lab Results  Component Value Date   LDLCALC 88 06/10/2017   Lab Results  Component Value Date   TRIG 177 (H) 06/10/2017   Lab Results  Component Value Date   CHOLHDL 4.4 06/10/2017   Lab Results  Component Value Date   HGBA1C 13.5 (A) 10/14/2018   Assessment & Plan:   1. Uncontrolled type 2 diabetes mellitus with peripheral neuropathy (HCC) - Glucose (CBG) - HgB A1c - insulin lispro (HUMALOG) injection 10 Units - Urinalysis Dipstick - Hemoglobin A1c - Glucose (CBG), Fasting  2. Elevated glucose Hgb A1c at 14.6 today. He will continue to decrease foods/beverages high in sugars and carbs and follow Heart Healthy or DASH diet. Increase physical activity to at least 30 minutes cardio exercise daily.  - Hemoglobin A1c  3. Hemoglobin A1C greater than 9%, indicating poor diabetic control - Hemoglobin A1c  4. Essential hypertension The current medical regimen is effective; blood pressure is stable at 140/90 today; continue present plan and medications as prescribed. She will continue to decrease high sodium intake, excessive alcohol intake, increase potassium intake, smoking cessation, and increase physical activity of at least 30 minutes of cardio activity daily. She will continue to follow Heart Healthy or DASH diet.  5. Non compliance w medication regimen Lengthy discussion with patient concerning importance of daily compliance of all medications. Patient verbalized understanding.   6. Follow up He will follow up in 1 month.   Meds ordered this encounter  Medications  . insulin lispro (HUMALOG) injection 10 Units    Orders Placed This Encounter  Procedures  . Hemoglobin A1c  . Glucose (CBG)  . HgB A1c  . Urinalysis Dipstick  . Glucose (CBG), Fasting  Referral Orders  No referral(s) requested today   Kathe Becton,  MSN, FNP-BC [facility] [place], [place] [address] [phone] fax  Problem List Items Addressed This Visit      Endocrine   Uncontrolled type 2 diabetes mellitus with peripheral neuropathy ([place]) - Primary   Relevant Orders   Glucose (CBG) (Completed)   HgB A1c (Completed)   Urinalysis Dipstick (Completed)   Hemoglobin A1c   Glucose (CBG), Fasting (Completed)     Other   Elevated glucose   Relevant Orders   Hemoglobin A1c    Other Visit Diagnoses    Hemoglobin A1C greater than 9%, indicating poor diabetic control       Relevant Medications   insulin lispro (HUMALOG) injection 10 Units (Completed)   Other Relevant Orders   Hemoglobin A1c   Essential hypertension       Non compliance w medication regimen       Follow up          Meds ordered this encounter  Medications  . insulin lispro (HUMALOG) injection 10 Units    Follow-up: No follow-ups on file.    [name], FNP

## 2019-01-29 NOTE — Addendum Note (Signed)
Addended by: Adelina Mings on: 01/29/2019 09:50 AM   Modules accepted: Orders

## 2019-02-01 LAB — HEMOGLOBIN A1C
Est. average glucose Bld gHb Est-mCnc: 372 mg/dL
Hgb A1c MFr Bld: 14.6 % — ABNORMAL HIGH (ref 4.8–5.6)

## 2019-02-08 MED FILL — TRUEplus LANCETS 28G MISC: 25 days supply | Qty: 100 | Fill #1

## 2019-02-08 MED FILL — metFORMIN HCL 1000 MG TABS: 1000 | 30 days supply | Qty: 60 | Fill #0

## 2019-02-08 MED FILL — SILDENAFIL 25 MG TABLET: 25 | 30 days supply | Qty: 10 | Fill #0

## 2019-02-08 MED FILL — ?HUMALOG 100 UNITS/ML VIAL: 100 | 33 days supply | Qty: 10 | Fill #1

## 2019-02-08 MED FILL — !LEVEMIR 100 UNITS/ML VIAL: 100/ML | 33 days supply | Qty: 10 | Fill #0

## 2019-02-15 ENCOUNTER — Other Ambulatory Visit: Payer: Self-pay

## 2019-02-15 DIAGNOSIS — E559 Vitamin D deficiency, unspecified: Secondary | ICD-10-CM

## 2019-02-15 MED ORDER — VITAMIN D (ERGOCALCIFEROL) 1.25 MG (50000 UNIT) PO CAPS: 50000.0000 [IU] | ORAL_CAPSULE | ORAL | 6 refills | Status: DC

## 2019-02-15 MED FILL — VIT D2 1.25 MG (50,000 UNIT: 1.25 MG | 84 days supply | Qty: 12 | Fill #0

## 2019-02-15 MED FILL — !TRUE METRIX BLOOD GLUCOSE: 30 days supply | Qty: 1 | Fill #0

## 2019-02-18 DIAGNOSIS — R45851 Suicidal ideations: Secondary | ICD-10-CM

## 2019-02-18 DIAGNOSIS — G4700 Insomnia, unspecified: Secondary | ICD-10-CM

## 2019-02-18 DIAGNOSIS — F419 Anxiety disorder, unspecified: Secondary | ICD-10-CM

## 2019-02-18 HISTORY — DX: Suicidal ideations: R45.851

## 2019-02-18 HISTORY — DX: Insomnia, unspecified: G47.00

## 2019-02-18 HISTORY — DX: Anxiety disorder, unspecified: F41.9

## 2019-02-19 ENCOUNTER — Emergency Department (HOSPITAL_COMMUNITY)
Admission: EM | Admit: 2019-02-19 | Discharge: 2019-02-19 | Disposition: A | Discharge status: Home / Self Care | Payer: Self-pay | Attending: Emergency Medicine | Admitting: Emergency Medicine

## 2019-02-19 ENCOUNTER — Other Ambulatory Visit: Payer: Self-pay

## 2019-02-19 ENCOUNTER — Encounter (HOSPITAL_COMMUNITY): Payer: Self-pay

## 2019-02-19 DIAGNOSIS — E119 Type 2 diabetes mellitus without complications: Secondary | ICD-10-CM | POA: Insufficient documentation

## 2019-02-19 DIAGNOSIS — R45851 Suicidal ideations: Secondary | ICD-10-CM | POA: Insufficient documentation

## 2019-02-19 DIAGNOSIS — Z794 Long term (current) use of insulin: Secondary | ICD-10-CM | POA: Insufficient documentation

## 2019-02-19 DIAGNOSIS — F329 Major depressive disorder, single episode, unspecified: Secondary | ICD-10-CM | POA: Insufficient documentation

## 2019-02-19 DIAGNOSIS — I10 Essential (primary) hypertension: Secondary | ICD-10-CM | POA: Insufficient documentation

## 2019-02-19 DIAGNOSIS — Z79899 Other long term (current) drug therapy: Secondary | ICD-10-CM | POA: Insufficient documentation

## 2019-02-19 LAB — COMPREHENSIVE METABOLIC PANEL
ALT: 13 U/L (ref 0–44)
AST: 21 U/L (ref 15–41)
Albumin: 3.4 g/dL — ABNORMAL LOW (ref 3.5–5.0)
Alkaline Phosphatase: 105 U/L (ref 38–126)
Anion gap: 10 (ref 5–15)
BUN: 17 mg/dL (ref 6–20)
CO2: 25 mmol/L (ref 22–32)
Calcium: 9 mg/dL (ref 8.9–10.3)
Chloride: 103 mmol/L (ref 98–111)
Creatinine, Ser: 1.38 mg/dL — ABNORMAL HIGH (ref 0.61–1.24)
GFR calc Af Amer: >60 mL/min (ref >60)
GFR calc non Af Amer: >60 mL/min (ref >60)
Glucose, Bld: 296 mg/dL — ABNORMAL HIGH (ref 70–99)
Potassium: 3.4 mmol/L — ABNORMAL LOW (ref 3.5–5.1)
Sodium: 138 mmol/L (ref 135–145)
Total Bilirubin: 0.4 mg/dL (ref 0.3–1.2)
Total Protein: 7.4 g/dL (ref 6.5–8.1)

## 2019-02-19 LAB — CBC
HCT: 39 % (ref 39.0–52.0)
Hemoglobin: 12.4 g/dL — ABNORMAL LOW (ref 13.0–17.0)
MCH: 27 pg (ref 26.0–34.0)
MCHC: 31.8 g/dL (ref 30.0–36.0)
MCV: 85 fL (ref 80.0–100.0)
Platelets: 373 10*3/uL (ref 150–400)
RBC: 4.59 MIL/uL (ref 4.22–5.81)
RDW: 12.9 % (ref 11.5–15.5)
WBC: 6 10*3/uL (ref 4.0–10.5)
nRBC: 0 % (ref 0.0–0.2)

## 2019-02-19 LAB — SALICYLATE LEVEL: Salicylate Lvl: <7 mg/dL (ref 2.8–30.0)

## 2019-02-19 LAB — RAPID URINE DRUG SCREEN, HOSP PERFORMED
Amphetamines: NOT DETECTED
Barbiturates: NOT DETECTED
Benzodiazepines: NOT DETECTED
Cocaine: NOT DETECTED
Opiates: NOT DETECTED
Tetrahydrocannabinol: NOT DETECTED

## 2019-02-19 LAB — ETHANOL: Alcohol, Ethyl (B): <10 mg/dL (ref <10)

## 2019-02-19 LAB — ACETAMINOPHEN LEVEL: Acetaminophen (Tylenol), Serum: <10 ug/mL — ABNORMAL LOW (ref 10–30)

## 2019-02-19 NOTE — ED Provider Notes (Signed)
[facility] DEPT Provider Note   CSN: [id number] Arrival date & time: 02/19/19  1713     History   Chief Complaint Chief Complaint  Patient presents with  . Suicidal  . Psychiatric Evaluation    HPI [name] is a 48 y.o. male.     48 year old male with prior medical history as detailed below presents for evaluation of suicidality.  Patient reports that he has been having issues with his girlfriend and his girlfriend's family.  He feels depressed.  He is started to contemplate suicide.  He does not have a specific plan.  He is not taking any specific actions to actually harm himself.  He denies other acute complaint.  The history is provided by the patient and medical records.  Mental Health Problem Presenting symptoms: depression and suicidal thoughts   Degree of incapacity (severity):  Moderate Onset quality:  Gradual Timing:  Constant Progression:  Worsening Treatment compliance:  Untreated Relieved by:  Nothing Worsened by:  Nothing   Past Medical History:  Diagnosis Date  . Diabetes mellitus type II, uncontrolled ([place]) 07/17/2006  . Elevated alkaline phosphatase level 01/2019  . ESSENTIAL HYPERTENSION 07/17/2006  . HYPERLIPIDEMIA 11/24/2008  . Pneumonia 10/22/2011  . Vitamin D deficiency 01/2019    Patient Active Problem List   Diagnosis Date Noted  . Elevated glucose 07/29/2018  . Muscle spasms of both lower extremities 07/29/2018  . Skin rash 07/29/2018  . 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 Healthcare maintenance 07/02/2016  . New left bundle branch block (LBBB)   . 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 Hyperlipidemia LDL goal <70 11/24/2008  . Uncontrolled type 2 diabetes mellitus with peripheral neuropathy ([place]) 07/17/2006    Past Surgical History:  Procedure Laterality Date  . ARM SURGERY          Home Medications    Prior to Admission medications   Medication Sig Start Date End  Date Taking? Authorizing Provider  atorvastatin (LIPITOR) 20 MG tablet Take 1 tablet (20 mg total) by mouth daily. 07/29/18   [name], FNP  Blood Glucose Monitoring Suppl (TRUE METRIX METER) w/Device KIT Use as instructed 01/29/19   [name], FNP  cyclobenzaprine (FLEXERIL) 10 MG tablet Take 1 tablet (10 mg total) by mouth 2 (two) times daily as needed for muscle spasms. 07/29/18   [name], FNP  gabapentin (NEURONTIN) 300 MG capsule Take 1 capsule (300 mg total) by mouth 3 (three) times daily. 07/29/18   [name], FNP  glucose blood test strip Use to check fasting blood sugar once daily. 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 Insulin dependent 06/10/17   Gildardo Pounds, NP  insulin detemir (LEVEMIR) 100 UNIT/ML injection Inject 0.3 mLs (30 Units total) into the skin daily. 07/29/18   [name], FNP  insulin lispro (HUMALOG) 100 UNIT/ML injection Inject 20 units under skin, 2 times a day. 10/14/18 10/14/19  [name], FNP  Insulin Pen Needle (B-D UF III MINI PEN NEEDLES) 31G X 5 MM MISC Use as instructed 09/08/17   Gildardo Pounds, NP  Insulin Syringes, Disposable, U-100 0.3 ML MISC 100 Syringes by Does not apply route 3 (three) times daily with meals as needed. 07/29/18   [name], FNP  Lancets (ACCU-CHEK SOFT TOUCH) lancets Use as instructed 10/14/18   [name], FNP  lisinopril (PRINIVIL,ZESTRIL) 10 MG tablet Take 1 tablet (10 mg total) by mouth daily. 07/29/18   [name],  FNP  metFORMIN (GLUCOPHAGE) 1000 MG tablet Take 1 tablet (1,000 mg total) by mouth 2 (two) times daily with a meal. 01/22/19   [name], FNP  sildenafil (VIAGRA) 25 MG tablet Take 1 tablet (25 mg total) by mouth as needed for erectile dysfunction. for erectile dysfunction. 01/22/19   [name], FNP  triamcinolone cream (KENALOG) 0.1 % Apply 1 application topically 2 (two) times daily. 07/29/18   [name], FNP  TRUEplus Lancets 28G MISC Use as instructed 10/14/18    [name], FNP  Vitamin D, Ergocalciferol, (DRISDOL) 1.25 MG (50000 UT) CAPS capsule Take 1 capsule (50,000 Units total) by mouth every 7 (seven) days. 02/15/19   [name], FNP    Family History Family History  Problem Relation Age of Onset  . Hypertension Mother 33  . Cancer Mother        bladder cancer  . Alcohol abuse Father   . Hypertension Brother     Social History Social History   Tobacco Use  . Smoking status: Never Smoker  . Smokeless tobacco: Never Used  Substance Use Topics  . Alcohol use: No    Alcohol/week: 0.0 standard drinks  . Drug use: No     Allergies   Sulfa antibiotics, Bactrim [sulfamethoxazole-trimethoprim], Cephalexin, and Other   Review of Systems Review of Systems  Psychiatric/Behavioral: Positive for suicidal ideas.  All other systems reviewed and are negative.    Physical Exam Updated Vital Signs Ht 5' 7"  (1.702 m)   Wt 65.3 kg   BMI 22.55 kg/m   Physical Exam Vitals signs and nursing note reviewed.  Constitutional:      General: He is not in acute distress.    Appearance: Normal appearance. He is well-developed.  HENT:     Head: Normocephalic and atraumatic.  Eyes:     Conjunctiva/sclera: Conjunctivae normal.     Pupils: Pupils are equal, round, and reactive to light.  Neck:     Musculoskeletal: Normal range of motion and neck supple.  Cardiovascular:     Rate and Rhythm: Normal rate and regular rhythm.     Heart sounds: Normal heart sounds.  Pulmonary:     Effort: Pulmonary effort is normal. No respiratory distress.     Breath sounds: Normal breath sounds.  Abdominal:     General: There is no distension.     Palpations: Abdomen is soft.     Tenderness: There is no abdominal tenderness.  Musculoskeletal: Normal range of motion.        General: No deformity.  Skin:    General: Skin is warm and dry.  Neurological:     General: No focal deficit present.     Mental Status: He is alert and oriented to  person, place, and time.  Psychiatric:     Comments: Flat affect   depressed mood  Endorses SI without specific plan        ED Treatments / Results  Labs (all labs ordered are listed, but only abnormal results are displayed) Labs Reviewed  CBC - Abnormal; Notable for the following components:      Result Value   Hemoglobin 12.4 (*)    All other components within normal limits  SARS CORONAVIRUS 2 (HOSPITAL ORDER, [facility])  COMPREHENSIVE METABOLIC PANEL  ETHANOL  SALICYLATE LEVEL  ACETAMINOPHEN LEVEL  RAPID URINE DRUG SCREEN, HOSP PERFORMED    EKG None  Radiology No results found.  Procedures Procedures (including critical  care time)  Medications Ordered in ED Medications - No data to display   Initial Impression / Assessment and Plan / ED Course  I have reviewed the triage vital signs and the nursing notes.  Pertinent labs & imaging results that were available during my care of the patient were reviewed by me and considered in my medical decision making (see chart for details).        MDM  Screen complete  KENY DONALD was evaluated in Emergency Department on 02/19/2019 for the symptoms described in the history of present illness. He was evaluated in the context of the global COVID-19 pandemic, which necessitated consideration that the patient might be at risk for infection with the SARS-CoV-2 virus that causes COVID-19. Institutional protocols and algorithms that pertain to the evaluation of patients at risk for COVID-19 are in a state of rapid change based on information released by regulatory bodies including the CDC and federal and state organizations. These policies and algorithms were followed during the patient's care in the ED.  Patient presented for evaluation of depression and reported suicidal ideation.  He is without evidence of acute medical pathology on exam or work-up.  Patient has been seen by TTS/behavioral  health.  He is contracting for safety.  He is appropriate for discharge.  Patient does understand the need for close follow-up.  Strict return precautions given and understood.  Final Clinical Impressions(s) / ED Diagnoses   Final diagnoses:  Suicidal ideation    ED Discharge Orders    None       Valarie Merino, MD 02/19/19 2024

## 2019-02-19 NOTE — BH Assessment (Addendum)
Tele Assessment Note   Patient Name: [name] MRN: [id number] Referring Physician: Valarie Merino, MD Location of Patient: [facility] Location of Provider: [place] is an 48 y.o. male who presents to the ED voluntarily due to [facility] with no plan. Pt states he and his girlfriend have been arguing due to conflict with his girlfriend and his girlfriend's mother. Pt states he has been living with his girlfriend and their pastor. Pt states he is angry because he feels his girlfriend listens to her mother and others instead of respecting their relationship. Pt is hyper focused on his girlfriend throughout the assessment and states he wants the pastor to "go to hell" because he feels the pastor lies. Pt states his girlfriend "listens to her stupid mother instead of thinking for herself." Pt states he came to the ED because he wants help with his girlfriend and her mother. Pt denies a plan. Pt states he is able to keep himself safe if he is d/c. Pt denies any prior psych hx. Pt reports he attends relationship counseling with his girlfriend every week. Pt reports he recently lost his job because it was raining and he did not want to catch the bus in the rain. Pt denies SA and denies HI and AVH.   Per Lindon Romp, NP pt does not meet criteria for inpt tx. TTS to fax OPT resources to [phone] as requested. EDP Messick, Wallis Bamberg, MD and triage nurse Maylon Cos, RN have been advised.  Diagnosis: Unspecified depressive d/o  Past Medical History:  Past Medical History:  Diagnosis Date  . Diabetes mellitus type II, uncontrolled ([place]) 07/17/2006  . Elevated alkaline phosphatase level 01/2019  . ESSENTIAL HYPERTENSION 07/17/2006  . HYPERLIPIDEMIA 11/24/2008  . Pneumonia 10/22/2011  . Vitamin D deficiency 01/2019    Past Surgical History:  Procedure Laterality Date  . ARM SURGERY      Family History:  Family History  Problem Relation Age of Onset  . Hypertension Mother  29  . Cancer Mother        bladder cancer  . Alcohol abuse Father   . Hypertension Brother     Social History:  reports that he has never smoked. He has never used smokeless tobacco. He reports that he does not drink alcohol or use drugs.  Additional Social History:  Alcohol / Drug Use Pain Medications: See MAR Prescriptions: See MAR Over the Counter: See MAR History of alcohol / drug use?: No history of alcohol / drug abuse  CIWA: CIWA-Ar BP: (!) 152/93 Pulse Rate: 80 COWS:    Allergies:  Allergies  Allergen Reactions  . Sulfa Antibiotics Rash    Severe rash. Do not give sulfa medications  . Bactrim [Sulfamethoxazole-Trimethoprim]     Rash  . Cephalexin     Patient with severe drug reaction including exfoliating skin rash and hypotension after being prescribed both cephalexin and sulfamethoxazole-trimethoprim simultaneously. Favor SMX as much more likely culprit as patient had tolerated other cephalosporins in the past, but cannot say with complete certainty that this was not related to cephalexin.   . Other     Patient states he is allergic to the TB test    Home Medications: (Not in a hospital admission)   OB/GYN Status:  No LMP for male patient.  General Assessment Data Location of Assessment: [facility] ED TTS Assessment: In system Is this a Tele or Face-to-Face Assessment?: Tele Assessment Is this an Initial Assessment or a Re-assessment for  this encounter?: Initial Assessment Patient Accompanied by:: N/A Language Other than English: No Living Arrangements: Homeless/Shelter What gender do you identify as?: Male Marital status: Long term relationship Pregnancy Status: No Living Arrangements: Alone Can pt return to current living arrangement?: Yes Admission Status: Voluntary Is patient capable of signing voluntary admission?: Yes Referral Source: Self/Family/Friend Insurance type: none     Crisis Care Plan Living Arrangements: Alone Name of Psychiatrist:  none Name of Therapist: none  Education Status Is patient currently in school?: No Is the patient employed, unemployed or receiving disability?: Unemployed  Risk to self with the past 6 months Suicidal Ideation: Yes-Currently Present Has patient been a risk to self within the past 6 months prior to admission? : No Suicidal Intent: No Has patient had any suicidal intent within the past 6 months prior to admission? : No Is patient at risk for suicide?: Yes Suicidal Plan?: No Has patient had any suicidal plan within the past 6 months prior to admission? : No Access to Means: No What has been your use of drugs/alcohol within the last 12 months?: denies use Previous Attempts/Gestures: No Triggers for Past Attempts: None known Intentional Self Injurious Behavior: None Family Suicide History: No Recent stressful life event(s): Job Loss, Financial Problems, Loss (Comment), Conflict (Comment), Other (Comment)(broke up with girlfriend) Persecutory voices/beliefs?: No Depression: Yes Depression Symptoms: Despondent, Feeling worthless/self pity, Feeling angry/irritable Substance abuse history and/or treatment for substance abuse?: No Suicide prevention information given to non-admitted patients: Not applicable  Risk to Others within the past 6 months Homicidal Ideation: No Does patient have any lifetime risk of violence toward others beyond the six months prior to admission? : No Thoughts of Harm to Others: No Current Homicidal Intent: No Current Homicidal Plan: No Access to Homicidal Means: No History of harm to others?: No Assessment of Violence: None Noted Does patient have access to weapons?: No Criminal Charges Pending?: No Does patient have a court date: No Is patient on probation?: No  Psychosis Hallucinations: None noted Delusions: None noted  Mental Status Report Appearance/Hygiene: In scrubs, Unremarkable Eye Contact: Good Motor Activity: Freedom of movement Speech:  Logical/coherent Level of Consciousness: Alert Mood: Depressed Affect: Flat Anxiety Level: None Thought Processes: Relevant, Coherent Judgement: Partial Orientation: Person, Place, Time, Situation, Appropriate for developmental age Obsessive Compulsive Thoughts/Behaviors: None  Cognitive Functioning Concentration: Normal Memory: Remote Intact, Recent Intact Is patient IDD: No Insight: Fair Impulse Control: Good Appetite: Fair Have you had any weight changes? : No Change Sleep: Decreased Total Hours of Sleep: 5 Vegetative Symptoms: None  ADLScreening [facility] Assessment Services) Patient's cognitive ability adequate to safely complete daily activities?: Yes Patient able to express need for assistance with ADLs?: Yes Independently performs ADLs?: Yes (appropriate for developmental age)  Prior Inpatient Therapy Prior Inpatient Therapy: No  Prior Outpatient Therapy Prior Outpatient Therapy: No Does patient have an ACCT team?: No Does patient have Intensive In-House Services?  : No Does patient have [facility] services? : No Does patient have P4CC services?: No  ADL Screening (condition at time of admission) Patient's cognitive ability adequate to safely complete daily activities?: Yes Is the patient deaf or have difficulty hearing?: No Does the patient have difficulty seeing, even when wearing glasses/contacts?: No Does the patient have difficulty concentrating, remembering, or making decisions?: No Patient able to express need for assistance with ADLs?: Yes Does the patient have difficulty dressing or bathing?: No Independently performs ADLs?: Yes (appropriate for developmental age) Does the patient have difficulty walking or climbing stairs?: No  Weakness of Legs: None Weakness of Arms/Hands: None  Home Assistive Devices/Equipment Home Assistive Devices/Equipment: None    Abuse/Neglect Assessment (Assessment to be complete while patient is alone) Abuse/Neglect Assessment  Can Be Completed: Yes Physical Abuse: Denies Verbal Abuse: Denies Sexual Abuse: Denies Exploitation of patient/patient's resources: Denies Self-Neglect: Denies     Regulatory affairs officer (For Healthcare) Does Patient Have a Medical Advance Directive?: No Would patient like information on creating a medical advance directive?: No - Patient declined          Disposition: Per Lindon Romp, NP pt does not meet criteria for inpt tx. TTS to fax OPT resources. EDP Messick, Wallis Bamberg, MD and triage nurse Maylon Cos, RN have been advised.  Disposition Initial Assessment Completed for this Encounter: Yes Disposition of Patient: Discharge Patient refused recommended treatment: No Mode of transportation if patient is discharged/movement?: Walking  This service was provided via telemedicine using a 2-way, interactive audio and video technology.  Names of all persons participating in this telemedicine service and their role in this encounter. Name: [name] Role: Patient  Name: Lind Covert Role: TTS          Lyanne Co 02/19/2019 8:27 PM

## 2019-02-19 NOTE — ED Triage Notes (Signed)
Patient called 911 and stated that he was suicidal. Patient states that his girlfriend broke up with him . Patient was living with girlfriend and her family and they did not want the patient living there any more.  Patient denies having a plan. Patient denies visual or auditory hallucinations. Patient denies any drug or alcohol use.

## 2019-02-19 NOTE — Discharge Instructions (Addendum)
Please return for any problem.  Follow-up with your regular care provider as instructed. °

## 2019-02-19 NOTE — Progress Notes (Signed)
Per Lindon Romp, NP pt does not meet criteria for inpt tx. TTS to fax OPT resources to [phone] as requested. EDP Messick, Wallis Bamberg, MD and triage nurse Maylon Cos, RN have been advised.  Lind Covert, MSW, LCSW Therapeutic Triage Specialist  [phone]

## 2019-02-22 ENCOUNTER — Encounter: Payer: Self-pay | Admitting: Family Medicine

## 2019-02-22 ENCOUNTER — Other Ambulatory Visit: Payer: Self-pay

## 2019-02-22 ENCOUNTER — Ambulatory Visit (INDEPENDENT_AMBULATORY_CARE_PROVIDER_SITE_OTHER): Payer: Self-pay | Admitting: Family Medicine

## 2019-02-22 VITALS — BP 127/66 | HR 98 | Temp 99.9°F | Ht 67.0 in | Wt 147.4 lb

## 2019-02-22 DIAGNOSIS — E1165 Type 2 diabetes mellitus with hyperglycemia: Secondary | ICD-10-CM

## 2019-02-22 DIAGNOSIS — E1142 Type 2 diabetes mellitus with diabetic polyneuropathy: Secondary | ICD-10-CM

## 2019-02-22 DIAGNOSIS — R7309 Other abnormal glucose: Secondary | ICD-10-CM

## 2019-02-22 DIAGNOSIS — I10 Essential (primary) hypertension: Secondary | ICD-10-CM

## 2019-02-22 DIAGNOSIS — R0602 Shortness of breath: Secondary | ICD-10-CM

## 2019-02-22 DIAGNOSIS — IMO0002 Reserved for concepts with insufficient information to code with codable children: Secondary | ICD-10-CM

## 2019-02-22 DIAGNOSIS — I447 Left bundle-branch block, unspecified: Secondary | ICD-10-CM

## 2019-02-22 DIAGNOSIS — Z09 Encounter for follow-up examination after completed treatment for conditions other than malignant neoplasm: Secondary | ICD-10-CM

## 2019-02-22 DIAGNOSIS — F419 Anxiety disorder, unspecified: Secondary | ICD-10-CM

## 2019-02-22 DIAGNOSIS — R002 Palpitations: Secondary | ICD-10-CM

## 2019-02-22 DIAGNOSIS — R45851 Suicidal ideations: Secondary | ICD-10-CM

## 2019-02-22 HISTORY — DX: Left bundle-branch block, unspecified: I44.7

## 2019-02-22 LAB — POCT URINALYSIS DIPSTICK
Glucose, UA: POSITIVE — AB
Ketones, UA: NEGATIVE
Leukocytes, UA: NEGATIVE
Nitrite, UA: NEGATIVE
Protein, UA: POSITIVE — AB
Spec Grav, UA: >=1.03 — AB (ref 1.010–1.025)
Urobilinogen, UA: 1 E.U./dL
pH, UA: 5 (ref 5.0–8.0)

## 2019-02-22 LAB — GLUCOSE, POCT (MANUAL RESULT ENTRY): POC Glucose: 390 mg/dl — AB (ref 70–99)

## 2019-02-22 MED ORDER — ALPRAZOLAM 0.5 MG PO TABS: 0.5000 mg | ORAL_TABLET | Freq: Every evening | ORAL | 0 refills | Status: DC | PRN

## 2019-02-22 NOTE — Patient Instructions (Signed)
Left Bundle Branch Block  Left bundle branch block (LBBB) is a problem with the way that electrical impulses pass through the heart (electrical conduction abnormality). The heart depends on an electrical pulse to beat normally. The electrical signal for a heartbeat starts in the upper chambers of the heart (atria) and then travels to the two lower chambers (left and rightventricles). An LBBB is a partial or complete block of the pathway that carries the signal to the left ventricle. If you have LBBB, the left side of your heart does not beat normally. LBBB may be a warning sign of heart disease. What are the causes? This condition may be caused by:  Heart disease.  Disease of the arteries in the heart (arteriosclerosis).  Stiffening or weakening of heart muscle (cardiomyopathy).  Infection of heart muscle (myocarditis).  High blood pressure (hypertension). In some cases, the cause may not be known. What increases the risk? The following factors may make you more likely to develop this condition:  Being male.  Being 46 years of age or older.  Having heart disease.  Having had a heart attack or heart surgery. What are the signs or symptoms? This condition may not cause any symptoms. If you do have symptoms, they may include:  Feeling dizzy or light-headed.  Fainting. How is this diagnosed? This condition may be diagnosed based on an electrocardiogram (ECG). It is often diagnosed when an ECG is done as part of a routine physical or to help find the cause of fainting spells. You may also have imaging tests to find out more about your condition. These may include:  Chest X-rays.  Echo test (echocardiogram). How is this treated? If you do not have symptoms or any other type of heart disease, you may not need treatment for this condition. However, you may need to see your health care provider more often because LBBB can be a warning sign of future heart problems. You may get  treatment for other heart problems or high blood pressure. If LBBB causes symptoms or other heart problems, you may need to have an electrical device (pacemaker) implanted under the skin of your chest. A pacemaker sends electrical signals to your heart to keep it beating normally. Follow these instructions at home:  Follow instructions from your health care provider about eating or drinking restrictions.  Take over-the-counter and prescription medicines only as told by your health care provider.  Return to your normal activities as told by your health care provider. Ask your health care provider what activities are safe for you.  Follow a heart-healthy diet and maintain a healthy weight. Work with a diet and nutrition specialist (dietitian) to create an eating plan that is best for you.  Get regular exercise as told by your health care provider.  Do not use any products that contain nicotine or tobacco, such as cigarettes and e-cigarettes. If you need help quitting, ask your health care provider.  Keep all follow-up visits as told by your health care provider. This is important. Contact a health care provider if:  You are light-headed or you faint. Get help right away if:  You have chest pain.  You have difficulty breathing. These symptoms may represent a serious problem that is an emergency. Do not wait to see if the symptoms will go away. Get medical help right away. Call your local emergency services (911 in the [place] Do not drive yourself to the hospital. Summary  For the heart to beat normally, an electrical signal must  travel to the lower left chamber of the heart (left ventricle). Left bundle branch block (LBBB) is a partial or complete block of the pathway that carries that signal.  This condition may not cause any symptoms. In some cases, a person may feel dizzy or light-headed or may faint.  Treatment may not be needed for LBBB if you do not have symptoms or any other type  of heart disease.  You may need to see your health care provider more often because LBBB can be a warning sign of future heart problems. This information is not intended to replace advice given to you by your health care provider. Make sure you discuss any questions you have with your health care provider. Document Released: 06/25/2016 Document Revised: 04/18/2017 Document Reviewed: 06/25/2016 Elsevier Patient Education  2020 [employer].

## 2019-02-22 NOTE — Progress Notes (Signed)
Patient [facility] Follow Up  Subjective:  Patient ID: [name], male    DOB: [birth year]  Age: 48 y.o. MRN: [id number]  CC:  Chief Complaint  Patient presents with  . Follow-up    HTN, DM  . ER Follow UP    Depression- pt reported still depressed    HPI [name] is a 48 year old male who presents for Hospital Follow Up today.   Past Medical History:  Diagnosis Date  . Anxiety 02/2019  . Diabetes mellitus type II, uncontrolled ([place]) 07/17/2006  . Elevated alkaline phosphatase level 01/2019  . ESSENTIAL HYPERTENSION 07/17/2006  . HYPERLIPIDEMIA 11/24/2008  . Insomnia 02/2019  . Left bundle branch block 02/22/2019  . Pneumonia 10/22/2011  . Suicidal ideations 02/2019  . Vitamin D deficiency 01/2019   Current Status: Since his last office visit, he has had an ED visit on 02/19/2019 for Suicidal Ideation. He is doing well with no complaints. His anxiety is moderate today. He continues to have family issues. He denies suicidal ideations, homicidal ideations, or auditory hallucinations. He is not eating, sleeping, or socializing as usual. He denies fatigue, frequent urination, blurred vision, excessive hunger, excessive thirst, weight gain, weight loss, and poor wound healing. He continues to check his feet regularly. He denies visual changes, chest pain, cough, shortness of breath, heart palpitations, and falls. He has occasional headaches and dizziness with position changes. Denies severe headaches, confusion, seizures, double vision, and blurred vision, nausea and vomiting. He denies fevers, chills, recent infections, weight loss, and night sweats. No reports of GI problems such as nausea, vomiting, diarrhea, and constipation. He has no reports of blood in stools, dysuria and hematuria.He denies pain today.   Past Surgical History:  Procedure Laterality Date  . ARM SURGERY      Family History  Problem Relation Age of  Onset  . Hypertension Mother 34  . Cancer Mother        bladder cancer  . Alcohol abuse Father   . Hypertension Brother     Social History   Socioeconomic History  . Marital status: Married    Spouse name: Not on file  . Number of children: Not on file  . Years of education: Not on file  . Highest education level: Not on file  Occupational History  . 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 Occupation: Biochemist, clinical    Comment: Updated June 2013  . Occupation: Engineer, civil (consulting)    Comment: Began Jan 2014  Social Needs  . Financial resource strain: Not on file  . Food insecurity    Worry: Not on file    Inability: Not on file  . Transportation needs    Medical: Not on file    Non-medical: Not on file  Tobacco Use  . Smoking status: Never Smoker  . Smokeless tobacco: Never Used  Substance and Sexual Activity  . Alcohol use: No    Alcohol/week: 0.0 standard drinks  . Drug use: No  . Sexual activity: Yes    Partners: Female  Lifestyle  . Physical activity    Days per week: Not on file    Minutes per session: Not on file  . Stress: Not on file  Relationships  . Social Herbalist on phone: Not on file    Gets together: Not on file    Attends religious service: Not on file    Active  member of club or organization: Not on file    Attends meetings of clubs or organizations: Not on file    Relationship status: Not on file  . Intimate partner violence    Fear of current or ex partner: Not on file    Emotionally abused: Not on file    Physically abused: Not on file    Forced sexual activity: Not on file  Other Topics Concern  . Not on file  Social History Narrative   Lives with his mother and brother.   Wife has had a foot amputation; is a type 1 DM.    Outpatient Medications Prior to Visit  Medication Sig Dispense Refill  . atorvastatin (LIPITOR) 20 MG tablet Take 1 tablet (20 mg total) by mouth daily. 90 tablet 3  . Blood Glucose Monitoring Suppl  (TRUE METRIX METER) w/Device KIT Use as instructed 1 kit 0  . cyclobenzaprine (FLEXERIL) 10 MG tablet Take 1 tablet (10 mg total) by mouth 2 (two) times daily as needed for muscle spasms. 30 tablet 3  . gabapentin (NEURONTIN) 300 MG capsule Take 1 capsule (300 mg total) by mouth 3 (three) times daily. 90 capsule 3  . glucose blood test strip Use to check fasting blood sugar once daily. 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 Insulin dependent 100 each 12  . insulin detemir (LEVEMIR) 100 UNIT/ML injection Inject 0.3 mLs (30 Units total) into the skin daily. 10 mL 11  . insulin lispro (HUMALOG) 100 UNIT/ML injection Inject 20 units under skin, 2 times a day. 10 mL 3  . Insulin Pen Needle (B-D UF III MINI PEN NEEDLES) 31G X 5 MM MISC Use as instructed 90 each 1  . Insulin Syringes, Disposable, U-100 0.3 ML MISC 100 Syringes by Does not apply route 3 (three) times daily with meals as needed. 100 each 6  . Lancets (ACCU-CHEK SOFT TOUCH) lancets Use as instructed 100 each 12  . lisinopril (PRINIVIL,ZESTRIL) 10 MG tablet Take 1 tablet (10 mg total) by mouth daily. 90 tablet 3  . metFORMIN (GLUCOPHAGE) 1000 MG tablet Take 1 tablet (1,000 mg total) by mouth 2 (two) times daily with a meal. 180 tablet 3  . sildenafil (VIAGRA) 25 MG tablet Take 1 tablet (25 mg total) by mouth as needed for erectile dysfunction. for erectile dysfunction. 30 tablet 3  . triamcinolone cream (KENALOG) 0.1 % Apply 1 application topically 2 (two) times daily. 30 g 3  . TRUEplus Lancets 28G MISC Use as instructed 100 each 3  . Vitamin D, Ergocalciferol, (DRISDOL) 1.25 MG (50000 UT) CAPS capsule Take 1 capsule (50,000 Units total) by mouth every 7 (seven) days. 5 capsule 6   No facility-administered medications prior to visit.     Allergies  Allergen Reactions  . Sulfa Antibiotics Rash    Severe rash. Do not give sulfa medications  . Bactrim [Sulfamethoxazole-Trimethoprim]     Rash  . Cephalexin     Patient with severe drug reaction including  exfoliating skin rash and hypotension after being prescribed both cephalexin and sulfamethoxazole-trimethoprim simultaneously. Favor SMX as much more likely culprit as patient had tolerated other cephalosporins in the past, but cannot say with complete certainty that this was not related to cephalexin.   . Other     Patient states he is allergic to the TB test   ROS Review of Systems  Constitutional: Positive for appetite change (decreased).  HENT: Negative.   Eyes: Negative.   Respiratory: Negative.   Cardiovascular: Negative.   Gastrointestinal: Negative.  Endocrine: Negative.   Genitourinary: Negative.   Skin: Negative.   Allergic/Immunologic: Negative.   Neurological: Positive for dizziness (occasional) and headaches (occasional ).  Hematological: Negative.   Psychiatric/Behavioral: Negative.    Objective:    Physical Exam  Constitutional: He is oriented to person, place, and time. He appears well-developed and well-nourished.  HENT:  Head: Normocephalic and atraumatic.  Eyes: Conjunctivae are normal.  Neck: Normal range of motion. Neck supple.  Cardiovascular: Normal rate, regular rhythm, normal heart sounds and intact distal pulses.  Pulmonary/Chest: Effort normal and breath sounds normal.  Abdominal: Soft. Bowel sounds are normal.  Musculoskeletal: Normal range of motion.  Neurological: He is alert and oriented to person, place, and time. He has normal reflexes.  Skin: Skin is warm and dry.  Psychiatric: He has a normal mood and affect. His behavior is normal. Judgment and thought content normal.  Nursing note and vitals reviewed.   BP 127/66 (BP Location: Left Arm, Patient Position: Sitting, Cuff Size: Normal)   Pulse 98   Temp 99.9 F (37.7 C) (Oral)   Ht 5' 7"  (1.702 m)   Wt 147 lb 6.4 oz (66.9 kg)   SpO2 99%   BMI 23.09 kg/m  Wt Readings from Last 3 Encounters:  02/22/19 147 lb 6.4 oz (66.9 kg)  02/19/19 144 lb (65.3 kg)  01/22/19 147 lb (66.7 kg)    Health Maintenance Due  Topic Date Due  . OPHTHALMOLOGY EXAM  03/19/1983  . FOOT EXAM  08/20/2017  . LIPID PANEL  06/10/2018    There are no preventive care reminders to display for this patient.  Lab Results  Component Value Date   TSH 2.470 01/22/2019   Lab Results  Component Value Date   WBC 6.0 02/19/2019   HGB 12.4 (L) 02/19/2019   HCT 39.0 02/19/2019   MCV 85.0 02/19/2019   PLT 373 02/19/2019   Lab Results  Component Value Date   NA 138 02/19/2019   K 3.4 (L) 02/19/2019   CO2 25 02/19/2019   GLUCOSE 296 (H) 02/19/2019   BUN 17 02/19/2019   CREATININE 1.38 (H) 02/19/2019   BILITOT 0.4 02/19/2019   ALKPHOS 105 02/19/2019   AST 21 02/19/2019   ALT 13 02/19/2019   PROT 7.4 02/19/2019   ALBUMIN 3.4 (L) 02/19/2019   CALCIUM 9.0 02/19/2019   ANIONGAP 10 02/19/2019   Lab Results  Component Value Date   CHOL 159 06/10/2017   Lab Results  Component Value Date   HDL 36 (L) 06/10/2017   Lab Results  Component Value Date   LDLCALC 88 06/10/2017   Lab Results  Component Value Date   TRIG 177 (H) 06/10/2017   Lab Results  Component Value Date   CHOLHDL 4.4 06/10/2017   Lab Results  Component Value Date   HGBA1C 14.6 (H) 01/29/2019   Assessment & Plan:   Patient currently not insured. He is to contact office if he is unable to schedule with Cardiology because of financial restraints.   1. Hospital discharge follow-up  2. Suicidal ideation - Ambulatory referral to Psychiatry  3. Uncontrolled type 2 diabetes mellitus with peripheral neuropathy (HCC) - POCT glucose (manual entry) - POCT urinalysis dipstick  4. Elevated glucose  5. Essential hypertension The current medical regimen is effective; blood pressure is stable at 127/66 today; continue present plan and medications as prescribed. He will continue to take medications as prescribed, to decrease high sodium intake, excessive alcohol intake, increase potassium intake, smoking cessation, and  increase physical activity of at least 30 minutes of cardio activity daily. He will continue to follow Heart Healthy or DASH diet.  6. Heart palpitations EKG revealed Left Bundle Branch Block.  - EKG 12-Lead - Ambulatory referral to Cardiology  7. Anxiety We will initiate Alprazolam today as a one time fill, because of increased anxiety. - ALPRAZolam (XANAX) 0.5 MG tablet; Take 1 tablet (0.5 mg total) by mouth at bedtime as needed for anxiety.  Dispense: 30 tablet; Refill: 0 - Ambulatory referral to Psychiatry  8. Shortness of breath Stable. No signs or symptoms of respiratory distress noted or reported.  - Ambulatory referral to Cardiology  9. Follow up He will follow up in 3 months.   Meds ordered this encounter  Medications  . ALPRAZolam (XANAX) 0.5 MG tablet    Sig: Take 1 tablet (0.5 mg total) by mouth at bedtime as needed for anxiety.    Dispense:  30 tablet    Refill:  0    Orders Placed This Encounter  Procedures  . Ambulatory referral to Psychiatry  . Ambulatory referral to Cardiology  . POCT glucose (manual entry)  . POCT urinalysis dipstick  . EKG 12-Lead     Referral Orders     Ambulatory referral to Psychiatry     Ambulatory referral to Cardiology   Kathe Becton,  MSN, FNP-BC [place] [facility] [address] [place],  [address] [phone]- fax   Problem List Items Addressed This Visit      Endocrine   Uncontrolled type 2 diabetes mellitus with peripheral neuropathy ([place])   Relevant Medications   ALPRAZolam (XANAX) 0.5 MG tablet   Other Relevant Orders   POCT glucose (manual entry) (Completed)     Other   Elevated glucose    Other Visit Diagnoses    Hospital discharge follow-up    -  Primary   Suicidal ideations       Relevant Medications   ALPRAZolam (XANAX) 0.5 MG tablet   Other Relevant Orders   Ambulatory referral to Psychiatry   Essential  hypertension       Heart palpitations       Relevant Orders   EKG 12-Lead   Ambulatory referral to Cardiology   Anxiety       Relevant Medications   ALPRAZolam (XANAX) 0.5 MG tablet   Other Relevant Orders   Ambulatory referral to Psychiatry   Shortness of breath       Relevant Orders   Ambulatory referral to Cardiology   Follow up          Meds ordered this encounter  Medications  . ALPRAZolam (XANAX) 0.5 MG tablet    Sig: Take 1 tablet (0.5 mg total) by mouth at bedtime as needed for anxiety.    Dispense:  30 tablet    Refill:  0    Follow-up: Return in about 3 months (around 05/25/2019).    [name], FNP

## 2019-02-23 ENCOUNTER — Telehealth: Payer: Self-pay | Admitting: *Deleted

## 2019-02-23 NOTE — Telephone Encounter (Signed)
Mr.Underwood's is not excepting calls at this time.

## 2019-03-04 ENCOUNTER — Encounter: Payer: Self-pay | Admitting: Cardiovascular Disease

## 2019-03-04 ENCOUNTER — Ambulatory Visit (INDEPENDENT_AMBULATORY_CARE_PROVIDER_SITE_OTHER): Payer: Self-pay | Admitting: Cardiovascular Disease

## 2019-03-04 ENCOUNTER — Other Ambulatory Visit: Payer: Self-pay

## 2019-03-04 DIAGNOSIS — R0609 Other forms of dyspnea: Secondary | ICD-10-CM | POA: Insufficient documentation

## 2019-03-04 DIAGNOSIS — I10 Essential (primary) hypertension: Secondary | ICD-10-CM

## 2019-03-04 DIAGNOSIS — E785 Hyperlipidemia, unspecified: Secondary | ICD-10-CM

## 2019-03-04 DIAGNOSIS — I447 Left bundle-branch block, unspecified: Secondary | ICD-10-CM

## 2019-03-04 DIAGNOSIS — R0600 Dyspnea, unspecified: Secondary | ICD-10-CM

## 2019-03-04 NOTE — Progress Notes (Signed)
03/04/2019 [name]   04-15-1971  [id number]  Primary Physician [name], FNP Primary Cardiologist: Lorretta Harp MD [name], Douglas  Anderson:  [name] is a 48 y.o. thin appearing married African-American male father of 2 living children (1 deceased) with no grandchildren who is currently out of work but previously worked in the [employer].  He was referred by Douglas Becton, FNP for evaluation of dyspnea on exertion.  He has a history of treated hypertension, hyperlipidemia and diabetes.  There is no family history for heart disease.  Is never had a heart tach or stroke.  Does have chronic left bundle branch block.  He gets dyspneic especially while having sexual intercourse.   Current Meds  Medication Sig  . ALPRAZolam (XANAX) 0.5 MG tablet Take 1 tablet (0.5 mg total) by mouth at bedtime as needed for anxiety.  Marland Kitchen atorvastatin (LIPITOR) 20 MG tablet Take 1 tablet (20 mg total) by mouth daily.  . Blood Glucose Monitoring Suppl (TRUE METRIX METER) w/Device KIT Use as instructed  . cyclobenzaprine (FLEXERIL) 10 MG tablet Take 1 tablet (10 mg total) by mouth 2 (two) times daily as needed for muscle spasms.  Marland Kitchen gabapentin (NEURONTIN) 300 MG capsule Take 1 capsule (300 mg total) by mouth 3 (three) times daily.  Marland Kitchen glucose blood test strip Use to check fasting blood sugar once daily. 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 Insulin dependent  . insulin detemir (LEVEMIR) 100 UNIT/ML injection Inject 0.3 mLs (30 Units total) into the skin daily.  . insulin lispro (HUMALOG) 100 UNIT/ML injection Inject 20 units under skin, 2 times a day.  . Insulin Pen Needle (B-D UF III MINI PEN NEEDLES) 31G X 5 MM MISC Use as instructed  . Insulin Syringes, Disposable, U-100 0.3 ML MISC 100 Syringes by Does not apply route 3 (three) times daily with meals as needed.  . Lancets (ACCU-CHEK SOFT TOUCH) lancets Use as instructed  . lisinopril (PRINIVIL,ZESTRIL) 10 MG tablet Take 1 tablet (10 mg  total) by mouth daily.  . metFORMIN (GLUCOPHAGE) 1000 MG tablet Take 1 tablet (1,000 mg total) by mouth 2 (two) times daily with a meal.  . sildenafil (VIAGRA) 25 MG tablet Take 1 tablet (25 mg total) by mouth as needed for erectile dysfunction. for erectile dysfunction.  . triamcinolone cream (KENALOG) 0.1 % Apply 1 application topically 2 (two) times daily.  . TRUEplus Lancets 28G MISC Use as instructed  . Vitamin D, Ergocalciferol, (DRISDOL) 1.25 MG (50000 UT) CAPS capsule Take 1 capsule (50,000 Units total) by mouth every 7 (seven) days.     Allergies  Allergen Reactions  . Sulfa Antibiotics Rash    Severe rash. Do not give sulfa medications  . Bactrim [Sulfamethoxazole-Trimethoprim]     Rash  . Cephalexin     Patient with severe drug reaction including exfoliating skin rash and hypotension after being prescribed both cephalexin and sulfamethoxazole-trimethoprim simultaneously. Favor SMX as much more likely culprit as patient had tolerated other cephalosporins in the past, but cannot say with complete certainty that this was not related to cephalexin.   . Other     Patient states he is allergic to the TB test    Social History   Socioeconomic History  . Marital status: Married    Spouse name: Not on file  . Number of children: Not on file  . Years of education: Not on file  . Highest education level: Not on file  Occupational History  .  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 Occupation: Biochemist, clinical    Comment: Updated June 2013  . Occupation: Engineer, civil (consulting)    Comment: Began Jan 2014  Social Needs  . Financial resource strain: Not on file  . Food insecurity    Worry: Not on file    Inability: Not on file  . Transportation needs    Medical: Not on file    Non-medical: Not on file  Tobacco Use  . Smoking status: Never Smoker  . Smokeless tobacco: Never Used  Substance and Sexual Activity  . Alcohol use: No    Alcohol/week: 0.0 standard drinks  . Drug use: No   . Sexual activity: Yes    Partners: Female  Lifestyle  . Physical activity    Days per week: Not on file    Minutes per session: Not on file  . Stress: Not on file  Relationships  . Social Herbalist on phone: Not on file    Gets together: Not on file    Attends religious service: Not on file    Active member of club or organization: Not on file    Attends meetings of clubs or organizations: Not on file    Relationship status: Not on file  . Intimate partner violence    Fear of current or ex partner: Not on file    Emotionally abused: Not on file    Physically abused: Not on file    Forced sexual activity: Not on file  Other Topics Concern  . Not on file  Social History Narrative   Lives with his mother and brother.   Wife has had a foot amputation; is a type 1 DM.     Review of Systems: General: negative for chills, fever, night sweats or weight changes.  Cardiovascular: negative for chest pain, dyspnea on exertion, edema, orthopnea, palpitations, paroxysmal nocturnal dyspnea or shortness of breath Dermatological: negative for rash Respiratory: negative for cough or wheezing Urologic: negative for hematuria Abdominal: negative for nausea, vomiting, diarrhea, bright red blood per rectum, melena, or hematemesis Neurologic: negative for visual changes, syncope, or dizziness All other systems reviewed and are otherwise negative except as noted above.    Blood pressure 126/84, pulse 85, temperature (!) 97.2 F (36.2 C), temperature source Temporal, height _0  (1.702 m), weight 144 lb (65.3 kg), SpO2 98 %.  General appearance: alert and no distress Neck: no adenopathy, no carotid bruit, no JVD, supple, symmetrical, trachea midline and thyroid not enlarged, symmetric, no tenderness/mass/nodules Lungs: clear to auscultation bilaterally Heart: regular rate and rhythm, S1, S2 normal, no murmur, click, rub or gallop Extremities: extremities normal, atraumatic, no  cyanosis or edema Pulses: 2+ and symmetric Skin: Skin color, texture, turgor normal. No rashes or lesions Neurologic: Alert and oriented X 3, normal strength and tone. Normal symmetric reflexes. Normal coordination and gait  EKG sinus rhythm 85 with left bundle branch block.  I personally reviewed this EKG.  ASSESSMENT AND PLAN:   Hyperlipidemia LDL goal <70 History of hyperlipidemia on statin therapy with lipid profile performed 06/10/2017 revealing total cholesterol 59, LDL of 88 and HDL 36.  Essential hypertension History of essential hypertension blood pressure measured today 126/84.  He is on lisinopril.  New onset left bundle branch block (LBBB) Chronic  Dyspnea on exertion History of dyspnea on exertion without a history of tobacco abuse.  We will check a 2D echocardiogram to further evaluate.      Roderic Palau  Adora Fridge MD FACP,FACC,FAHA, FSCAI 03/04/2019 3:00 PM

## 2019-03-04 NOTE — Patient Instructions (Signed)
Medication Instructions:  Your physician recommends that you continue on your current medications as directed. Please refer to the Current Medication list given to you today.  If you need a refill on your cardiac medications before your next appointment, please call your pharmacy.   Lab work: none If you have labs (blood work) drawn today and your tests are completely normal, you will receive your results only by: Marland Kitchen MyChart Message (if you have MyChart) OR . A paper copy in the mail If you have any lab test that is abnormal or we need to change your treatment, we will call you to review the results.  Testing/Procedures: Your physician has requested that you have an echocardiogram. Echocardiography is a painless test that uses sound waves to create images of your heart. It provides your doctor with information about the size and shape of your heart and how well your heart's chambers and valves are working. This procedure takes approximately one hour. There are no restrictions for this procedure. LOCATION: [facility]: [place], [place], [place] [address]   Follow-Up: At [facility], you and your health needs are our priority.  As part of our continuing mission to provide you with exceptional heart care, we have created designated Provider Care Teams.  These Care Teams include your primary Cardiologist (physician) and Advanced Practice Providers (APPs -  Physician Assistants and Nurse Practitioners) who all work together to provide you with the care you need, when you need it. . You will need a follow up appointment as needed. You may see Dr. Gwenlyn Found or one of the following Advanced Practice Providers on your designated Care Team:   . Kerin Ransom, PA-C . Daleen Snook Kroeger, PA-C . Sande Rives, PA-C ____________________ . Almyra Deforest, PA-C . Fabian Sharp, PA-C . Jory Sims, DNP . Rosaria Ferries, PA-C

## 2019-03-04 NOTE — Assessment & Plan Note (Signed)
History of hyperlipidemia on statin therapy with lipid profile performed 06/10/2017 revealing total cholesterol 59, LDL of 88 and HDL 36.

## 2019-03-04 NOTE — Assessment & Plan Note (Signed)
History of essential hypertension blood pressure measured today 126/84.  He is on lisinopril.

## 2019-03-04 NOTE — Assessment & Plan Note (Signed)
History of dyspnea on exertion without a history of tobacco abuse.  We will check a 2D echocardiogram to further evaluate.

## 2019-03-04 NOTE — Assessment & Plan Note (Signed)
Chronic. 

## 2019-03-05 ENCOUNTER — Telehealth: Payer: Self-pay | Admitting: Cardiovascular Disease

## 2019-03-05 NOTE — Telephone Encounter (Signed)
Left message for patient to call and schedule Echo ordered by Dr. Gwenlyn Found

## 2019-03-08 ENCOUNTER — Encounter (HOSPITAL_COMMUNITY): Payer: Self-pay | Admitting: Cardiovascular Disease

## 2019-03-19 ENCOUNTER — Other Ambulatory Visit: Payer: Self-pay

## 2019-03-19 ENCOUNTER — Ambulatory Visit (HOSPITAL_COMMUNITY): Payer: Self-pay | Attending: Cardiology

## 2019-03-19 DIAGNOSIS — R0600 Dyspnea, unspecified: Secondary | ICD-10-CM | POA: Insufficient documentation

## 2019-03-19 DIAGNOSIS — R0609 Other forms of dyspnea: Secondary | ICD-10-CM

## 2019-03-30 ENCOUNTER — Other Ambulatory Visit: Payer: Self-pay

## 2019-03-30 ENCOUNTER — Ambulatory Visit (INDEPENDENT_AMBULATORY_CARE_PROVIDER_SITE_OTHER): Payer: Self-pay | Admitting: Cardiovascular Disease

## 2019-03-30 ENCOUNTER — Encounter: Payer: Self-pay | Admitting: Cardiovascular Disease

## 2019-03-30 VITALS — BP 144/92 | HR 95 | Temp 97.9°F | Ht 67.0 in | Wt 150.0 lb

## 2019-03-30 DIAGNOSIS — I519 Heart disease, unspecified: Secondary | ICD-10-CM

## 2019-03-30 DIAGNOSIS — R0602 Shortness of breath: Secondary | ICD-10-CM

## 2019-03-30 MED ORDER — METOPROLOL TARTRATE 100 MG PO TABS: ORAL_TABLET | ORAL | 0 refills | Status: DC

## 2019-03-30 MED FILL — METOPROLOL TARTRATE 100 MG: 100 | 1 days supply | Qty: 1 | Fill #0

## 2019-03-30 NOTE — Progress Notes (Signed)
[name] returns today for follow-up of his 2D echocardiogram.  His EF is in the 40 to 45% with septal dyssynergy probably related to left bundle branch block.  The etiology of his LV dysfunction is still not clear.  He does have risk factors for ischemic heart disease.  I am going to get a coronary CTA to further evaluate and we will see him back after that.  If his coronary CTA is normal will refer to the heart failure clinic for pharmacologic optimization.Lorretta Harp, M.D., [place], [facility], [place] [address] [place], [place]  [address]  [phone] 03/30/2019 2:02 PM

## 2019-03-30 NOTE — Patient Instructions (Addendum)
Medication Instructions:  Your physician recommends that you continue on your current medications as directed. Please refer to the Current Medication list given to you today.  If you need a refill on your cardiac medications before your next appointment, please call your pharmacy.   Lab work: BMET within one week of CT If you have labs (blood work) drawn today and your tests are completely normal, you will receive your results only by: [place] (if you have MyChart) OR A paper copy in the mail If you have any lab test that is abnormal or we need to change your treatment, we will call you to review the results.  Testing/Procedures: Non-Cardiac CT scanning, (CAT scanning), is a noninvasive, special x-ray that produces cross-sectional images of the body using x-rays and a computer. CT scans help physicians diagnose and treat medical conditions. For some CT exams, a contrast material is used to enhance visibility in the area of the body being studied. CT scans provide greater clarity and reveal more details than regular x-ray exams.   Follow-Up: At [facility], you and your health needs are our priority.  As part of our continuing mission to provide you with exceptional heart care, we have created designated Provider Care Teams.  These Care Teams include your primary Cardiologist (physician) and Advanced Practice Providers (APPs -  Physician Assistants and Nurse Practitioners) who all work together to provide you with the care you need, when you need it. You may see Dr Gwenlyn Found or one of the following Advanced Practice Providers on your designated Care Team:    Kerin Ransom, PA-C  [place], [place]  Coletta Memos, [place]  Your physician wants you to follow-up after Coronary CT  Any Other Special Instructions Will Be Listed Below (If Applicable).  Your cardiac CT will be scheduled at one of the below locations:   [facility] [address] [place], [place]  [id number] [phone]  If scheduled at [facility], please arrive at the [facility] main entrance of [facility] 30-45 minutes prior to test start time. Proceed to the [facility] Radiology Department (first floor) to check-in and test prep.  Please follow these instructions carefully (unless otherwise directed):  Hold all erectile dysfunction medications at least 3 days (72 hrs) prior to test.  On the Night Before the Test: . Be sure to Drink plenty of water. . Do not consume any caffeinated/decaffeinated beverages or chocolate 12 hours prior to your test. . Do not take any antihistamines 12 hours prior to your test  On the Day of the Test: . Drink plenty of water. Do not drink any water within one hour of the test. . Do not eat any food 4 hours prior to the test. . You may take your regular medications prior to the test.  . Take 100mg  metoprolol (Lopressor) two hours prior to test. . HOLD Furosemide/Hydrochlorothiazide morning of the test.  After the Test: . Drink plenty of water. . After receiving IV contrast, you may experience a mild flushed feeling. This is normal. . On occasion, you may experience a mild rash up to 24 hours after the test. This is not dangerous. If this occurs, you can take Benadryl 25 mg and increase your fluid intake. . If you experience trouble breathing, this can be serious. If it is severe call 911 IMMEDIATELY. If it is mild, please call our office. . If you take any of these medications: Glipizide/Metformin, Avandament, Glucavance, please do not take 48  hours after completing test unless otherwise instructed.   Once we have confirmed authorization from your insurance company, we will call you to set up a date and time for your test.   For non-scheduling related questions, please contact the cardiac imaging nurse navigator should you have any questions/concerns: Marchia Bond, RN Navigator Cardiac Imaging [facility] [phone] Office

## 2019-04-07 ENCOUNTER — Other Ambulatory Visit: Payer: Self-pay

## 2019-04-07 ENCOUNTER — Emergency Department (HOSPITAL_COMMUNITY): Payer: Self-pay

## 2019-04-07 ENCOUNTER — Observation Stay (HOSPITAL_COMMUNITY)
Admission: EM | Admit: 2019-04-07 | Discharge: 2019-04-08 | Disposition: A | Discharge status: Home / Self Care | Payer: Self-pay | Attending: Internal Medicine | Admitting: Internal Medicine

## 2019-04-07 ENCOUNTER — Encounter (HOSPITAL_COMMUNITY): Payer: Self-pay | Admitting: *Deleted

## 2019-04-07 DIAGNOSIS — IMO0002 Reserved for concepts with insufficient information to code with codable children: Secondary | ICD-10-CM | POA: Diagnosis present

## 2019-04-07 DIAGNOSIS — I160 Hypertensive urgency: Principal | ICD-10-CM | POA: Diagnosis present

## 2019-04-07 DIAGNOSIS — I447 Left bundle-branch block, unspecified: Secondary | ICD-10-CM | POA: Insufficient documentation

## 2019-04-07 DIAGNOSIS — R079 Chest pain, unspecified: Secondary | ICD-10-CM

## 2019-04-07 DIAGNOSIS — I200 Unstable angina: Secondary | ICD-10-CM

## 2019-04-07 DIAGNOSIS — Z881 Allergy status to other antibiotic agents status: Secondary | ICD-10-CM | POA: Insufficient documentation

## 2019-04-07 DIAGNOSIS — E785 Hyperlipidemia, unspecified: Secondary | ICD-10-CM | POA: Insufficient documentation

## 2019-04-07 DIAGNOSIS — Z794 Long term (current) use of insulin: Secondary | ICD-10-CM | POA: Insufficient documentation

## 2019-04-07 DIAGNOSIS — F419 Anxiety disorder, unspecified: Secondary | ICD-10-CM | POA: Insufficient documentation

## 2019-04-07 DIAGNOSIS — E1142 Type 2 diabetes mellitus with diabetic polyneuropathy: Secondary | ICD-10-CM | POA: Insufficient documentation

## 2019-04-07 DIAGNOSIS — E1165 Type 2 diabetes mellitus with hyperglycemia: Secondary | ICD-10-CM | POA: Insufficient documentation

## 2019-04-07 DIAGNOSIS — Z79899 Other long term (current) drug therapy: Secondary | ICD-10-CM | POA: Insufficient documentation

## 2019-04-07 DIAGNOSIS — R778 Other specified abnormalities of plasma proteins: Secondary | ICD-10-CM | POA: Insufficient documentation

## 2019-04-07 DIAGNOSIS — Z882 Allergy status to sulfonamides status: Secondary | ICD-10-CM | POA: Insufficient documentation

## 2019-04-07 DIAGNOSIS — Z20828 Contact with and (suspected) exposure to other viral communicable diseases: Secondary | ICD-10-CM | POA: Insufficient documentation

## 2019-04-07 DIAGNOSIS — I10 Essential (primary) hypertension: Secondary | ICD-10-CM | POA: Diagnosis present

## 2019-04-07 LAB — BASIC METABOLIC PANEL
Anion gap: 11 (ref 5–15)
BUN: 26 mg/dL — ABNORMAL HIGH (ref 6–20)
CO2: 28 mmol/L (ref 22–32)
Calcium: 9.3 mg/dL (ref 8.9–10.3)
Chloride: 99 mmol/L (ref 98–111)
Creatinine, Ser: 1.45 mg/dL — ABNORMAL HIGH (ref 0.61–1.24)
GFR calc Af Amer: >60 mL/min (ref >60)
GFR calc non Af Amer: 57 mL/min — ABNORMAL LOW (ref >60)
Glucose, Bld: 363 mg/dL — ABNORMAL HIGH (ref 70–99)
Potassium: 3.8 mmol/L (ref 3.5–5.1)
Sodium: 138 mmol/L (ref 135–145)

## 2019-04-07 LAB — TROPONIN I (HIGH SENSITIVITY)
Troponin I (High Sensitivity): 36 ng/L — ABNORMAL HIGH (ref <18)
Troponin I (High Sensitivity): 58 ng/L — ABNORMAL HIGH (ref <18)

## 2019-04-07 LAB — CBC
HCT: 37.7 % — ABNORMAL LOW (ref 39.0–52.0)
Hemoglobin: 12.4 g/dL — ABNORMAL LOW (ref 13.0–17.0)
MCH: 27.7 pg (ref 26.0–34.0)
MCHC: 32.9 g/dL (ref 30.0–36.0)
MCV: 84.3 fL (ref 80.0–100.0)
Platelets: 321 10*3/uL (ref 150–400)
RBC: 4.47 MIL/uL (ref 4.22–5.81)
RDW: 12.5 % (ref 11.5–15.5)
WBC: 7.7 10*3/uL (ref 4.0–10.5)
nRBC: 0 % (ref 0.0–0.2)

## 2019-04-07 MED ORDER — CLONIDINE HCL 0.1 MG PO TABS
0.1000 mg | ORAL_TABLET | Freq: Once | ORAL | Status: AC
  Administered 2019-04-08: 0.1 mg via ORAL
  Filled 2019-04-07: qty 1

## 2019-04-07 MED ORDER — HYDRALAZINE HCL 20 MG/ML IJ SOLN
10.0000 mg | INTRAMUSCULAR | Status: DC | PRN
  Administered 2019-04-08: 10 mg via INTRAVENOUS
  Filled 2019-04-07: qty 1

## 2019-04-07 MED ORDER — OXYCODONE-ACETAMINOPHEN 5-325 MG PO TABS
2.0000 | ORAL_TABLET | Freq: Once | ORAL | Status: AC
  Administered 2019-04-07: 2 via ORAL
  Filled 2019-04-07: qty 2

## 2019-04-07 MED ORDER — ASPIRIN 81 MG PO CHEW
324.0000 mg | CHEWABLE_TABLET | Freq: Once | ORAL | Status: AC
  Administered 2019-04-07: 324 mg via ORAL
  Filled 2019-04-07: qty 4

## 2019-04-07 MED ORDER — SODIUM CHLORIDE 0.9% FLUSH: 3.0000 mL | Freq: Once | INTRAVENOUS | Status: DC

## 2019-04-07 NOTE — ED Triage Notes (Signed)
The pt arrived by gems from home  He was in a  Fight with his girlfriend and became upset and started having chest pain  He reports that he has a bad heart but unknown type

## 2019-04-07 NOTE — ED Provider Notes (Addendum)
[facility] EMERGENCY DEPARTMENT Provider Note   CSN: [id number] Arrival date & time: 04/07/19  1957     History   Chief Complaint Chief Complaint  Patient presents with  . Chest Pain    HPI [name] is a 48 y.o. male.  He has a history of diabetes hypertension hyperlipidemia.  He said he was in an argument with his girlfriend around 7 PM and it caused him to be very upset and his blood pressure went up and he started having chest pain.  He is complaining of 8 out of 10 chest pain.  It was not associated with shortness of breath sweatiness nausea vomiting.  He says he has a bad heart and he supposed to be getting CAT scan of his heart.  Denies prior heart attack.     The history is provided by the patient.  Chest Pain Pain location:  Substernal area Pain quality: aching   Pain radiates to:  Does not radiate Pain severity:  Severe Onset quality:  Sudden Duration:  2 hours Timing:  Constant Progression:  Improving Chronicity:  Recurrent Context: stress   Relieved by:  None tried Ineffective treatments:  None tried Associated symptoms: no abdominal pain, no back pain, no cough, no diaphoresis, no fever, no headache, no nausea, no shortness of breath and no vomiting   Risk factors: high cholesterol, hypertension and male sex     Past Medical History:  Diagnosis Date  . Anxiety 02/2019  . Diabetes mellitus type II, uncontrolled ([place]) 07/17/2006  . Elevated alkaline phosphatase level 01/2019  . ESSENTIAL HYPERTENSION 07/17/2006  . HYPERLIPIDEMIA 11/24/2008  . Insomnia 02/2019  . Left bundle branch block 02/22/2019  . Pneumonia 10/22/2011  . Suicidal ideations 02/2019  . Vitamin D deficiency 01/2019    Patient Active Problem List   Diagnosis Date Noted  . Dyspnea on exertion 03/04/2019  . Elevated glucose 07/29/2018  . Muscle spasms of both lower extremities 07/29/2018  . Skin rash 07/29/2018  . 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  Healthcare maintenance 07/02/2016  . New onset left bundle branch block (LBBB)   . 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 Hyperlipidemia LDL goal <70 11/24/2008  . Uncontrolled type 2 diabetes mellitus with peripheral neuropathy ([place]) 07/17/2006  . Essential hypertension 07/17/2006    Past Surgical History:  Procedure Laterality Date  . ARM SURGERY          Home Medications    Prior to Admission medications   Medication Sig Start Date End Date Taking? Authorizing Provider  ALPRAZolam Duanne Moron) 0.5 MG tablet Take 1 tablet (0.5 mg total) by mouth at bedtime as needed for anxiety. 02/22/19   [name], FNP  atorvastatin (LIPITOR) 20 MG tablet Take 1 tablet (20 mg total) by mouth daily. 07/29/18   [name], FNP  Blood Glucose Monitoring Suppl (TRUE METRIX METER) w/Device KIT Use as instructed 01/29/19   [name], FNP  cyclobenzaprine (FLEXERIL) 10 MG tablet Take 1 tablet (10 mg total) by mouth 2 (two) times daily as needed for muscle spasms. 07/29/18   [name], FNP  gabapentin (NEURONTIN) 300 MG capsule Take 1 capsule (300 mg total) by mouth 3 (three) times daily. 07/29/18   [name], FNP  glucose blood test strip Use to check fasting blood sugar once daily. 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 Insulin dependent 06/10/17   Gildardo Pounds, NP  insulin detemir (LEVEMIR) 100 UNIT/ML injection Inject 0.3 mLs (30  Units total) into the skin daily. 07/29/18   [name], FNP  insulin lispro (HUMALOG) 100 UNIT/ML injection Inject 20 units under skin, 2 times a day. 10/14/18 10/14/19  [name], FNP  Insulin Pen Needle (B-D UF III MINI PEN NEEDLES) 31G X 5 MM MISC Use as instructed 09/08/17   Gildardo Pounds, NP  Insulin Syringes, Disposable, U-100 0.3 ML MISC 100 Syringes by Does not apply route 3 (three) times daily with meals as needed. 07/29/18   [name], FNP  Lancets (ACCU-CHEK SOFT TOUCH) lancets Use as instructed 10/14/18   [name], FNP  lisinopril (PRINIVIL,ZESTRIL) 10 MG tablet Take 1 tablet (10 mg total) by mouth daily. 07/29/18   [name], FNP  metFORMIN (GLUCOPHAGE) 1000 MG tablet Take 1 tablet (1,000 mg total) by mouth 2 (two) times daily with a meal. 01/22/19   [name], FNP  metoprolol tartrate (LOPRESSOR) 100 MG tablet Take 2 hours prior to procedure 03/30/19   Lorretta Harp, MD  sildenafil (VIAGRA) 25 MG tablet Take 1 tablet (25 mg total) by mouth as needed for erectile dysfunction. for erectile dysfunction. 01/22/19   [name], FNP  triamcinolone cream (KENALOG) 0.1 % Apply 1 application topically 2 (two) times daily. 07/29/18   [name], FNP  TRUEplus Lancets 28G MISC Use as instructed 10/14/18   [name], FNP  Vitamin D, Ergocalciferol, (DRISDOL) 1.25 MG (50000 UT) CAPS capsule Take 1 capsule (50,000 Units total) by mouth every 7 (seven) days. 02/15/19   [name], FNP    Family History Family History  Problem Relation Age of Onset  . Hypertension Mother 84  . Cancer Mother        bladder cancer  . Alcohol abuse Father   . Hypertension Brother     Social History Social History   Tobacco Use  . Smoking status: Never Smoker  . Smokeless tobacco: Never Used  Substance Use Topics  . Alcohol use: No    Alcohol/week: 0.0 standard drinks  . Drug use: No     Allergies   Sulfa antibiotics, Bactrim [sulfamethoxazole-trimethoprim], Cephalexin, and Other   Review of Systems Review of Systems  Constitutional: Negative for diaphoresis and fever.  HENT: Negative for sore throat.   Eyes: Negative for visual disturbance.  Respiratory: Negative for cough and shortness of breath.   Cardiovascular: Positive for chest pain.  Gastrointestinal: Negative for abdominal pain, nausea and vomiting.  Genitourinary: Negative for dysuria.  Musculoskeletal: Negative for back pain.  Skin: Negative for rash.  Neurological: Negative for headaches.     Physical  Exam Updated Vital Signs BP (!) 184/106   Pulse (!) 107   Temp 99.3 F (37.4 C)   Ht 5' 7"  (1.702 m)   Wt 63 kg   SpO2 99%   BMI 21.75 kg/m   Physical Exam Vitals signs and nursing note reviewed.  Constitutional:      Appearance: He is well-developed.  HENT:     Head: Normocephalic and atraumatic.  Eyes:     Conjunctiva/sclera: Conjunctivae normal.  Neck:     Musculoskeletal: Neck supple.  Cardiovascular:     Rate and Rhythm: Normal rate and regular rhythm.     Heart sounds: Normal heart sounds. No murmur.  Pulmonary:     Effort: Pulmonary effort is normal. No respiratory distress.     Breath sounds: Normal breath sounds.  Abdominal:     Palpations: Abdomen is soft.  Tenderness: There is no abdominal tenderness.  Musculoskeletal: Normal range of motion.     Right lower leg: He exhibits no tenderness. No edema.     Left lower leg: He exhibits no tenderness. No edema.  Skin:    General: Skin is warm and dry.     Capillary Refill: Capillary refill takes less than 2 seconds.  Neurological:     General: No focal deficit present.     Mental Status: He is alert.      ED Treatments / Results  Labs (all labs ordered are listed, but only abnormal results are displayed) Labs Reviewed  BASIC METABOLIC PANEL - Abnormal; Notable for the following components:      Result Value   Glucose, Bld 363 (*)    BUN 26 (*)    Creatinine, Ser 1.45 (*)    GFR calc non Af Amer 57 (*)    All other components within normal limits  CBC - Abnormal; Notable for the following components:   Hemoglobin 12.4 (*)    HCT 37.7 (*)    All other components within normal limits  HEMOGLOBIN A1C - Abnormal; Notable for the following components:   Hgb A1c MFr Bld 13.3 (*)    All other components within normal limits  CBG MONITORING, ED - Abnormal; Notable for the following components:   Glucose-Capillary 274 (*)    All other components within normal limits  CBG MONITORING, ED - Abnormal;  Notable for the following components:   Glucose-Capillary 252 (*)    All other components within normal limits  CBG MONITORING, ED - Abnormal; Notable for the following components:   Glucose-Capillary 275 (*)    All other components within normal limits  TROPONIN I (HIGH SENSITIVITY) - Abnormal; Notable for the following components:   Troponin I (High Sensitivity) 36 (*)    All other components within normal limits  TROPONIN I (HIGH SENSITIVITY) - Abnormal; Notable for the following components:   Troponin I (High Sensitivity) 58 (*)    All other components within normal limits  TROPONIN I (HIGH SENSITIVITY) - Abnormal; Notable for the following components:   Troponin I (High Sensitivity) 62 (*)    All other components within normal limits  SARS CORONAVIRUS 2 (TAT 6-24 HRS)  RAPID URINE DRUG SCREEN, HOSP PERFORMED  HIV ANTIBODY (ROUTINE TESTING W REFLEX)  TROPONIN I (HIGH SENSITIVITY)    EKG EKG Interpretation  Date/Time:  Wednesday April 07 2019 20:06:08 EST Ventricular Rate:  103 PR Interval:  134 QRS Duration: 134 QT Interval:  396 QTC Calculation: 518 R Axis:   64 Text Interpretation: Sinus tachycardia Right atrial enlargement Left bundle branch block Abnormal ECG similar prior 10/20 Confirmed by Aletta Edouard [phone]) on 04/07/2019 9:24:11 PM   Radiology Dg Chest 2 View  Result Date: 04/07/2019 CLINICAL DATA:  Chest pain EXAM: CHEST - 2 VIEW COMPARISON:  01/02/2016 FINDINGS: Heart is borderline in size. Lungs clear. No effusions or edema. No acute bony abnormality or pneumothorax. IMPRESSION: No active cardiopulmonary disease. Electronically Signed   By: Rolm Baptise M.D.   On: 04/07/2019 20:55    Procedures .Critical Care Performed by: Hayden Rasmussen, MD Authorized by: Hayden Rasmussen, MD   Critical care provider statement:    Critical care time (minutes):  45   Critical care time was exclusive of:  Separately billable procedures and treating other  patients   Critical care was necessary to treat or prevent imminent or life-threatening deterioration of the following conditions:  Cardiac failure   Critical care was time spent personally by me on the following activities:  Discussions with consultants, evaluation of patient's response to treatment, examination of patient, ordering and performing treatments and interventions, ordering and review of laboratory studies, ordering and review of radiographic studies, pulse oximetry, re-evaluation of patient's condition, obtaining history from patient or surrogate, review of old charts and development of treatment plan with patient or surrogate   I assumed direction of critical care for this patient from another provider in my specialty: no     (including critical care time)  Medications Ordered in ED Medications  oxyCODONE-acetaminophen (PERCOCET/ROXICET) 5-325 MG per tablet 2 tablet (2 tablets Oral Given 04/07/19 2151)  aspirin chewable tablet 324 mg (324 mg Oral Given 04/07/19 2150)  cloNIDine (CATAPRES) tablet 0.1 mg (0.1 mg Oral Given 04/08/19 0024)  sodium chloride 0.9 % bolus 500 mL (0 mLs Intravenous Stopped 04/08/19 0642)  nitroGLYCERIN (NITROSTAT) SL tablet 0.8 mg (0.8 mg Sublingual Given 04/08/19 0920)  iohexol (OMNIPAQUE) 350 MG/ML injection 80 mL (80 mLs Intravenous Contrast Given 04/08/19 0938)     Initial Impression / Assessment and Plan / ED Course  I have reviewed the triage vital signs and the nursing notes.  Pertinent labs & imaging results that were available during my care of the patient were reviewed by me and considered in my medical decision making (see chart for details).  Clinical Course as of Apr 07 941  Wed Apr 07, 2019  2222 Differential diagnosis includes ACS, arrhythmia, pneumonia, pneumothorax, stress reaction, hypertensive urgency, hypertensive emergency.   [MB]  2251 Reevaluated patient.  He said he is not sure if he is having any pain anymore, he seems to be  more interested in trying to figure out why his phone is not working and also using the TV remote.   [MB]  2319 Reviewed the case with cardiology on-call Dr. Aundra Dubin.  He said the patient should likely be admitted to the hospitalist service and get his coronary CT tomorrow.  He said if his troponin rises greatly to call him back.   [MB]  2321 Discussed with [facility] hospitalist who accepts the patient for admission.  Patient agreeable per plan to admit for further work-up.  Coronavirus test ordered.   [MB]    Clinical Course User Index [MB] Hayden Rasmussen, MD        Final Clinical Impressions(s) / ED Diagnoses   Final diagnoses:  Unstable angina [facility])  Essential hypertension  Chest pain    ED Discharge Orders    None       Hayden Rasmussen, MD 04/08/19 7619    Hayden Rasmussen, MD 04/16/19 1924

## 2019-04-08 ENCOUNTER — Other Ambulatory Visit: Payer: Self-pay

## 2019-04-08 ENCOUNTER — Observation Stay (HOSPITAL_COMMUNITY): Payer: Self-pay

## 2019-04-08 ENCOUNTER — Encounter (HOSPITAL_COMMUNITY): Payer: Self-pay | Admitting: Internal Medicine

## 2019-04-08 DIAGNOSIS — R079 Chest pain, unspecified: Secondary | ICD-10-CM

## 2019-04-08 DIAGNOSIS — E1165 Type 2 diabetes mellitus with hyperglycemia: Secondary | ICD-10-CM

## 2019-04-08 DIAGNOSIS — I10 Essential (primary) hypertension: Secondary | ICD-10-CM

## 2019-04-08 DIAGNOSIS — E1142 Type 2 diabetes mellitus with diabetic polyneuropathy: Secondary | ICD-10-CM

## 2019-04-08 DIAGNOSIS — E785 Hyperlipidemia, unspecified: Secondary | ICD-10-CM

## 2019-04-08 DIAGNOSIS — I160 Hypertensive urgency: Principal | ICD-10-CM

## 2019-04-08 LAB — CBG MONITORING, ED
Glucose-Capillary: 274 mg/dL — ABNORMAL HIGH (ref 70–99)
Glucose-Capillary: 252 mg/dL — ABNORMAL HIGH (ref 70–99)
Glucose-Capillary: 275 mg/dL — ABNORMAL HIGH (ref 70–99)

## 2019-04-08 LAB — RAPID URINE DRUG SCREEN, HOSP PERFORMED
Amphetamines: NOT DETECTED
Barbiturates: NOT DETECTED
Benzodiazepines: NOT DETECTED
Cocaine: NOT DETECTED
Opiates: NOT DETECTED
Tetrahydrocannabinol: NOT DETECTED

## 2019-04-08 LAB — HEMOGLOBIN A1C
Hgb A1c MFr Bld: 13.3 % — ABNORMAL HIGH (ref 4.8–5.6)
Mean Plasma Glucose: 335.01 mg/dL

## 2019-04-08 LAB — GLUCOSE, CAPILLARY
Glucose-Capillary: 76 mg/dL (ref 70–99)
Glucose-Capillary: 77 mg/dL (ref 70–99)

## 2019-04-08 LAB — TROPONIN I (HIGH SENSITIVITY)
Troponin I (High Sensitivity): 62 ng/L — ABNORMAL HIGH (ref <18)
Troponin I (High Sensitivity): 55 ng/L — ABNORMAL HIGH (ref <18)

## 2019-04-08 LAB — HIV ANTIBODY (ROUTINE TESTING W REFLEX): HIV Screen 4th Generation wRfx: NONREACTIVE

## 2019-04-08 LAB — SARS CORONAVIRUS 2 (TAT 6-24 HRS): SARS Coronavirus 2: NEGATIVE

## 2019-04-08 MED ORDER — NITROGLYCERIN 0.4 MG SL SUBL
0.8000 mg | SUBLINGUAL_TABLET | Freq: Once | SUBLINGUAL | Status: AC
  Administered 2019-04-08: 09:00:00 0.8 mg via SUBLINGUAL

## 2019-04-08 MED ORDER — INSULIN ASPART 100 UNIT/ML ~~LOC~~ SOLN: 0.0000 [IU] | Freq: Three times a day (TID) | SUBCUTANEOUS | Status: DC

## 2019-04-08 MED ORDER — ATORVASTATIN CALCIUM 10 MG PO TABS: 20.0000 mg | ORAL_TABLET | ORAL | Status: DC | PRN

## 2019-04-08 MED ORDER — ACETAMINOPHEN 325 MG PO TABS: 650.0000 mg | ORAL_TABLET | ORAL | Status: DC | PRN

## 2019-04-08 MED ORDER — INSULIN ASPART 100 UNIT/ML ~~LOC~~ SOLN
0.0000 [IU] | SUBCUTANEOUS | Status: DC
  Administered 2019-04-08: 8 [IU] via SUBCUTANEOUS

## 2019-04-08 MED ORDER — SODIUM CHLORIDE 0.9 % IV BOLUS
500.0000 mL | Freq: Once | INTRAVENOUS | Status: AC
  Administered 2019-04-08: 500 mL via INTRAVENOUS

## 2019-04-08 MED ORDER — ENOXAPARIN SODIUM 40 MG/0.4ML ~~LOC~~ SOLN: 40.0000 mg | SUBCUTANEOUS | Status: DC

## 2019-04-08 MED ORDER — ONDANSETRON HCL 4 MG/2ML IJ SOLN: 4.0000 mg | Freq: Four times a day (QID) | INTRAMUSCULAR | Status: DC | PRN

## 2019-04-08 MED ORDER — CYCLOBENZAPRINE HCL 10 MG PO TABS: 10.0000 mg | ORAL_TABLET | Freq: Two times a day (BID) | ORAL | Status: DC | PRN

## 2019-04-08 MED ORDER — GABAPENTIN 300 MG PO CAPS: 300.0000 mg | ORAL_CAPSULE | Freq: Three times a day (TID) | ORAL | Status: DC

## 2019-04-08 MED ORDER — INSULIN DETEMIR 100 UNIT/ML ~~LOC~~ SOLN
30.0000 [IU] | Freq: Every day | SUBCUTANEOUS | Status: DC
  Administered 2019-04-08: 30 [IU] via SUBCUTANEOUS
  Filled 2019-04-08: qty 0.3

## 2019-04-08 MED ORDER — METOPROLOL TARTRATE 5 MG/5ML IV SOLN
INTRAVENOUS | Status: AC
  Administered 2019-04-08: 5 mg via INTRAVENOUS
  Filled 2019-04-08: qty 10

## 2019-04-08 MED ORDER — LISINOPRIL 10 MG PO TABS
10.0000 mg | ORAL_TABLET | Freq: Every day | ORAL | Status: DC
  Administered 2019-04-08: 10 mg via ORAL
  Filled 2019-04-08: qty 1

## 2019-04-08 MED ORDER — INSULIN ASPART 100 UNIT/ML ~~LOC~~ SOLN
10.0000 [IU] | Freq: Three times a day (TID) | SUBCUTANEOUS | Status: DC
  Administered 2019-04-08 (×2): 10 [IU] via SUBCUTANEOUS

## 2019-04-08 MED ORDER — IOHEXOL 350 MG/ML SOLN
80.0000 mL | Freq: Once | INTRAVENOUS | Status: AC | PRN
  Administered 2019-04-08: 80 mL via INTRAVENOUS

## 2019-04-08 MED ORDER — NITROGLYCERIN 0.4 MG SL SUBL
SUBLINGUAL_TABLET | SUBLINGUAL | Status: AC
  Administered 2019-04-08: 0.8 mg via SUBLINGUAL
  Filled 2019-04-08: qty 2

## 2019-04-08 MED ORDER — INSULIN ASPART 100 UNIT/ML ~~LOC~~ SOLN: 5.0000 [IU] | Freq: Three times a day (TID) | SUBCUTANEOUS | Status: DC

## 2019-04-08 MED ORDER — METOPROLOL TARTRATE 5 MG/5ML IV SOLN
5.0000 mg | INTRAVENOUS | Status: DC | PRN
  Administered 2019-04-08: 09:00:00 5 mg via INTRAVENOUS
  Administered 2019-04-08: 10 mg via INTRAVENOUS
  Administered 2019-04-08: 09:00:00 5 mg via INTRAVENOUS

## 2019-04-08 MED ORDER — ALPRAZOLAM 0.5 MG PO TABS: 0.5000 mg | ORAL_TABLET | Freq: Every evening | ORAL | Status: DC | PRN

## 2019-04-08 MED ORDER — INSULIN DETEMIR 100 UNIT/ML ~~LOC~~ SOLN: 20.0000 [IU] | Freq: Every day | SUBCUTANEOUS | Status: DC
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
## 2019-04-08 NOTE — ED Notes (Signed)
Pt CBG 275

## 2019-04-08 NOTE — ED Notes (Signed)
On the phone 

## 2019-04-08 NOTE — Discharge Instructions (Signed)
Chest Wall Pain Chest wall pain is pain in or around the bones and muscles of your chest. Chest wall pain may be caused by:  An injury.  Coughing a lot.  Using your chest and arm muscles too much. Sometimes, the cause may not be known. This pain may take a few weeks or longer to get better. Follow these instructions at home: Managing pain, stiffness, and swelling If told, put ice on the painful area:  Put ice in a plastic bag.  Place a towel between your skin and the bag.  Leave the ice on for 20 minutes, 2-3 times a day.  Activity  Rest as told by your doctor.  Avoid doing things that cause pain. This includes lifting heavy items.  Ask your doctor what activities are safe for you. General instructions   Take over-the-counter and prescription medicines only as told by your doctor.  Do not use any products that contain nicotine or tobacco, such as cigarettes, e-cigarettes, and chewing tobacco. If you need help quitting, ask your doctor.  Keep all follow-up visits as told by your doctor. This is important. Contact a doctor if:  You have a fever.  Your chest pain gets worse.  You have new symptoms. Get help right away if:  You feel sick to your stomach (nauseous) or you throw up (vomit).  You feel sweaty or light-headed.  You have a cough with mucus from your lungs (sputum) or you cough up blood.  You are short of breath. These symptoms may be an emergency. Do not wait to see if the symptoms will go away. Get medical help right away. Call your local emergency services (911 in the [place] Do not drive yourself to the hospital. Summary  Chest wall pain is pain in or around the bones and muscles of your chest.  It may be treated with ice, rest, and medicines. Your condition may also get better if you avoid doing things that cause pain.  Contact a doctor if you have a fever, chest pain that gets worse, or new symptoms.  Get help right away if you feel light-headed  or you get short of breath. These symptoms may be an emergency. This information is not intended to replace advice given to you by your health care provider. Make sure you discuss any questions you have with your health care provider. Document Released: 10/23/2007 Document Revised: 11/06/2017 Document Reviewed: 11/06/2017 Elsevier Patient Education  2020 [employer].

## 2019-04-08 NOTE — ED Notes (Signed)
No pain

## 2019-04-08 NOTE — ED Notes (Signed)
THE PT IS NON-COMPLIANT WITH HIS MEDS HE HAS NOT HAD ANY MEDS FOR AT LEAST 2 MONTHS

## 2019-04-08 NOTE — Progress Notes (Addendum)
Inpatient Diabetes Program Recommendations  AACE/ADA: New Consensus Statement on Inpatient Glycemic Control (2015)  Target Ranges:  Prepandial:   less than 140 mg/dL      Peak postprandial:   less than 180 mg/dL (1-2 hours)      Critically ill patients:  140 - 180 mg/dL   Lab Results  Component Value Date   GLUCAP 76 04/08/2019   HGBA1C 13.3 (H) 04/08/2019    Review of Glycemic Control Results for [name], [name] (MRN [id number]) as of 04/08/2019 11:25  Ref. Range 04/08/2019 06:40 04/08/2019 07:40 04/08/2019 10:34  Glucose-Capillary Latest Ref Range: 70 - 99 mg/dL 252 (H) 275 (H) 76   Diabetes history: Type 2 DM Outpatient Diabetes medications: Humalog 20 units BID, Levemir 30 units QD, Metformin 1000 mg BID Current orders for Inpatient glycemic control: Levemir 30 units QD, Novolog 10 units TID, Novolog 0-15 units Q4H  Inpatient Diabetes Program Recommendations:    Anticipate with current orders patient may experience a hypoglycemic event. Patient already received Novolog 18 units and Levemir 30 units.  Current CBG 76 mg/dL.   Consider decreasing Levemir 20 units QD, Novolog 6 units TID, Novolog 0-15 units TID.  @1420 -Attempted to see patient. Patient not responding to voice; sleeping. Will attempt again.   Thanks, Bronson Curb, MSN, RNC-OB Diabetes Coordinator [phone] (8a-5p)

## 2019-04-08 NOTE — ED Notes (Signed)
Admitting paged to RN per her request 

## 2019-04-08 NOTE — ED Notes (Signed)
Pt asleep  I woke him up  Iv nss  Started 250 bolus

## 2019-04-08 NOTE — ED Notes (Signed)
pts bp up  He is staying on his phoned arguing with people.

## 2019-04-08 NOTE — ED Notes (Signed)
Admitting doctor advised of hypotension order for 583ml of nss bolus

## 2019-04-08 NOTE — ED Notes (Signed)
Pt sleeping unless disturbed

## 2019-04-08 NOTE — ED Notes (Signed)
Admitting doctor at  The bedside 

## 2019-04-08 NOTE — ED Notes (Signed)
Tele   Breakfast ordered  

## 2019-04-08 NOTE — ED Notes (Signed)
THE PT HAS FINALLY STOPPED ARGUING ON THE PHONE  HE HAS BEEN ASLEEP FOR OVER ONE HOUR  HIS BP IS LOW NOW

## 2019-04-09 ENCOUNTER — Telehealth: Payer: Self-pay | Admitting: Cardiovascular Disease

## 2019-04-09 NOTE — Telephone Encounter (Signed)
Left message for patientto call and schedule follow up appointment with Dr. Gwenlyn Found to discuss Cardiac CT results

## 2019-04-13 ENCOUNTER — Ambulatory Visit: Payer: Self-pay | Admitting: Cardiovascular Disease

## 2019-04-14 ENCOUNTER — Ambulatory Visit: Payer: Self-pay | Admitting: Cardiovascular Disease

## 2019-04-20 ENCOUNTER — Ambulatory Visit: Payer: Self-pay | Admitting: Cardiovascular Disease

## 2019-04-27 ENCOUNTER — Other Ambulatory Visit: Payer: Self-pay

## 2019-04-27 ENCOUNTER — Ambulatory Visit (INDEPENDENT_AMBULATORY_CARE_PROVIDER_SITE_OTHER): Payer: Self-pay | Admitting: Cardiovascular Disease

## 2019-04-27 ENCOUNTER — Encounter: Payer: Self-pay | Admitting: Cardiovascular Disease

## 2019-04-27 DIAGNOSIS — I519 Heart disease, unspecified: Secondary | ICD-10-CM

## 2019-04-27 DIAGNOSIS — I10 Essential (primary) hypertension: Secondary | ICD-10-CM

## 2019-04-27 MED ORDER — LISINOPRIL 20 MG PO TABS: 20.0000 mg | ORAL_TABLET | Freq: Every day | ORAL | 3 refills | Status: DC

## 2019-04-27 MED ORDER — CARVEDILOL 6.25 MG PO TABS: 6.2500 mg | ORAL_TABLET | Freq: Two times a day (BID) | ORAL | 3 refills | Status: DC

## 2019-04-27 MED FILL — LISINOPRIL 20 MG TABLET: 20 | 30 days supply | Qty: 30 | Fill #0

## 2019-04-27 MED FILL — ?CARVEDILOL 6.25 MG TABLET: 6.25 | 30 days supply | Qty: 60 | Fill #0

## 2019-04-27 NOTE — Assessment & Plan Note (Signed)
Recent 2D echo performed 03/19/2019 revealed an EF of 40 to 45%.  There is no evidence of left ventricular hypertrophy.  Echo performed 01/02/2016 was essentially normal.  Recent coronary calcium score performed 04/07/2019 was 0 with no evidence of CAD suggesting that this this was a nonischemic cardiomyopathy potentially hypertensive heart disease related to medication noncompliance.  I am going to begin him on low-dose carvedilol, adjust his ACE inhibitor and we will recheck a 2D echo in 3 to 6 months after his blood pressures under better control.

## 2019-04-27 NOTE — Assessment & Plan Note (Addendum)
History of essential hypertension with blood pressure measured today at 182/110.  This came down to 168/100 after several minutes.  He is on low-dose lisinopril but has not taken his blood pressure medicines.  He also admits to dietary indiscretion with regards to salt.  He was recently hospitalized 04/07/2019 for 1 day for hypertensive urgency and chest pain.  I am going to increase his lisinopril from 10 to 20 mg a day and add low-dose large carvedilol because of his moderate LV dysfunction most likely hypertensive heart disease.

## 2019-04-27 NOTE — Progress Notes (Signed)
04/27/2019 WO[facility]   Nov 16, 1970  [id number]  Primary Physician [name], FNP Primary Cardiologist: Lorretta Harp MD Lupe Carney, [place]  HPI:  [name] is a 48 y.o.  thin appearing married African-American male father of 2 living children (1 deceased) with no grandchildren who is currently currently works as a Scientist, water quality at [employer]. He was referred by Kathe Becton, FNP for evaluation of dyspnea on exertion.  He has a history of treated hypertension, hyperlipidemia and diabetes.  There is no family history for heart disease.  Is never had a heart attack or stroke.    He does have chronic left bundle branch block.  He gets dyspneic especially while having sexual intercourse.  He was recently admitted to the hospital for hypertensive urgency 04/07/2019 after a disagreement with his fiancs Jocelyn.  His enzymes were fairly low.  He did have a coronary calcium score which was 0 suggesting his chest pain was noncardiac.  He admits to diet dietary discretion with regards to salt and medication noncompliance.  A 2D echo performed 03/19/2019 revealed an EF of 40 to 45%, significantly reduced compared to the previous echo performed 01/02/2016 which showed a normal EF.   Current Meds  Medication Sig  . ALPRAZolam (XANAX) 0.5 MG tablet Take 1 tablet (0.5 mg total) by mouth at bedtime as needed for anxiety.  Marland Kitchen atorvastatin (LIPITOR) 20 MG tablet Take 1 tablet (20 mg total) by mouth daily. (Patient taking differently: Take 20 mg by mouth as needed (cholesterol). )  . Blood Glucose Monitoring Suppl (TRUE METRIX METER) w/Device KIT Use as instructed  . cyclobenzaprine (FLEXERIL) 10 MG tablet Take 1 tablet (10 mg total) by mouth 2 (two) times daily as needed for muscle spasms.  Marland Kitchen gabapentin (NEURONTIN) 300 MG capsule Take 1 capsule (300 mg total) by mouth 3 (three) times daily.  Marland Kitchen glucose blood test strip Use to check fasting blood sugar once daily. 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 Insulin  dependent  . insulin detemir (LEVEMIR) 100 UNIT/ML injection Inject 0.3 mLs (30 Units total) into the skin daily.  . insulin lispro (HUMALOG) 100 UNIT/ML injection Inject 20 units under skin, 2 times a day.  . Insulin Pen Needle (B-D UF III MINI PEN NEEDLES) 31G X 5 MM MISC Use as instructed  . Insulin Syringes, Disposable, U-100 0.3 ML MISC 100 Syringes by Does not apply route 3 (three) times daily with meals as needed.  . Lancets (ACCU-CHEK SOFT TOUCH) lancets Use as instructed  . lisinopril (ZESTRIL) 20 MG tablet Take 1 tablet (20 mg total) by mouth daily.  . metFORMIN (GLUCOPHAGE) 1000 MG tablet Take 1 tablet (1,000 mg total) by mouth 2 (two) times daily with a meal.  . metoprolol tartrate (LOPRESSOR) 100 MG tablet Take 2 hours prior to procedure  . sildenafil (VIAGRA) 25 MG tablet Take 1 tablet (25 mg total) by mouth as needed for erectile dysfunction. for erectile dysfunction.  . triamcinolone cream (KENALOG) 0.1 % Apply 1 application topically 2 (two) times daily.  . TRUEplus Lancets 28G MISC Use as instructed  . Vitamin D, Ergocalciferol, (DRISDOL) 1.25 MG (50000 UT) CAPS capsule Take 1 capsule (50,000 Units total) by mouth every 7 (seven) days. (Patient taking differently: Take 50,000 Units by mouth every 7 (seven) days. Wednesdays)  . [DISCONTINUED] lisinopril (PRINIVIL,ZESTRIL) 10 MG tablet Take 1 tablet (10 mg total) by mouth daily.     Allergies  Allergen Reactions  . Sulfa Antibiotics Rash  Severe rash. Do not give sulfa medications  . Bactrim [Sulfamethoxazole-Trimethoprim]     Rash  . Cephalexin     Patient with severe drug reaction including exfoliating skin rash and hypotension after being prescribed both cephalexin and sulfamethoxazole-trimethoprim simultaneously. Favor SMX as much more likely culprit as patient had tolerated other cephalosporins in the past, but cannot say with complete certainty that this was not related to cephalexin.   . Other     Patient states he  is allergic to the TB test    Social History   Socioeconomic History  . Marital status: Legally Separated    Spouse name: Not on file  . Number of children: Not on file  . Years of education: Not on file  . Highest education level: Not on file  Occupational History  . 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 Occupation: Biochemist, clinical    Comment: Updated June 2013  . Occupation: Engineer, civil (consulting)    Comment: Began Jan 2014  Social Needs  . Financial resource strain: Not on file  . Food insecurity    Worry: Not on file    Inability: Not on file  . Transportation needs    Medical: Not on file    Non-medical: Not on file  Tobacco Use  . Smoking status: Never Smoker  . Smokeless tobacco: Never Used  Substance and Sexual Activity  . Alcohol use: No    Alcohol/week: 0.0 standard drinks  . Drug use: No  . Sexual activity: Yes    Partners: Female  Lifestyle  . Physical activity    Days per week: Not on file    Minutes per session: Not on file  . Stress: Not on file  Relationships  . Social Herbalist on phone: Not on file    Gets together: Not on file    Attends religious service: Not on file    Active member of club or organization: Not on file    Attends meetings of clubs or organizations: Not on file    Relationship status: Not on file  . Intimate partner violence    Fear of current or ex partner: Not on file    Emotionally abused: Not on file    Physically abused: Not on file    Forced sexual activity: Not on file  Other Topics Concern  . Not on file  Social History Narrative   Lives with his mother and brother.   Wife has had a foot amputation; is a type 1 DM.     Review of Systems: General: negative for chills, fever, night sweats or weight changes.  Cardiovascular: negative for chest pain, dyspnea on exertion, edema, orthopnea, palpitations, paroxysmal nocturnal dyspnea or shortness of breath Dermatological: negative for rash Respiratory:  negative for cough or wheezing Urologic: negative for hematuria Abdominal: negative for nausea, vomiting, diarrhea, bright red blood per rectum, melena, or hematemesis Neurologic: negative for visual changes, syncope, or dizziness All other systems reviewed and are otherwise negative except as noted above.    Blood pressure (!) 168/100, pulse 94, temperature (!) 97.2 F (36.2 C), height 5' 7"  (1.702 m), weight 145 lb (65.8 kg).  General appearance: alert and no distress Neck: no adenopathy, no carotid bruit, no JVD, supple, symmetrical, trachea midline and thyroid not enlarged, symmetric, no tenderness/mass/nodules Lungs: clear to auscultation bilaterally Heart: regular rate and rhythm, S1, S2 normal, no murmur, click, rub or gallop Extremities: extremities normal, atraumatic, no cyanosis or edema  Pulses: 2+ and symmetric Skin: Skin color, texture, turgor normal. No rashes or lesions Neurologic: Alert and oriented X 3, normal strength and tone. Normal symmetric reflexes. Normal coordination and gait  EKG not performed today  ASSESSMENT AND PLAN:   Hyperlipidemia LDL goal <70 History of hyperlipidemia on statin therapy with lipid profile performed 06/10/2017 revealing total cholesterol of 59, LDL 88 and HDL 36.  Essential hypertension History of essential hypertension with blood pressure measured today at 182/110.  This came down to 168/100 after several minutes.  He is on low-dose lisinopril but has not taken his blood pressure medicines.  He also admits to dietary indiscretion with regards to salt.  He was recently hospitalized 04/07/2019 for 1 day for hypertensive urgency and chest pain.  I am going to increase his lisinopril from 10 to 20 mg a day and add low-dose large carvedilol because of his moderate LV dysfunction most likely hypertensive heart disease.  Left ventricular dysfunction Recent 2D echo performed 03/19/2019 revealed an EF of 40 to 45%.  There is no evidence of left  ventricular hypertrophy.  Echo performed 01/02/2016 was essentially normal.  Recent coronary calcium score performed 04/07/2019 was 0 with no evidence of CAD suggesting that this this was a nonischemic cardiomyopathy potentially hypertensive heart disease related to medication noncompliance.  I am going to begin him on low-dose carvedilol, adjust his ACE inhibitor and we will recheck a 2D echo in 3 to 6 months after his blood pressures under better control.      Lorretta Harp MD FACP,FACC,FAHA, [facility] 04/27/2019 1:47 PM

## 2019-04-27 NOTE — Assessment & Plan Note (Signed)
History of hyperlipidemia on statin therapy with lipid profile performed 06/10/2017 revealing total cholesterol of 59, LDL 88 and HDL 36.

## 2019-04-27 NOTE — Patient Instructions (Signed)
Medication Instructions:  Increased Lisinopril to 20mg  Daily. Start taking 6.25mg  Carvedilol Daily.  If you need a refill on your cardiac medications before your next appointment, please call your pharmacy.   Lab work: NONE  Testing/Procedures: NONE  Follow-Up: At [facility], you and your health needs are our priority.  As part of our continuing mission to provide you with exceptional heart care, we have created designated Provider Care Teams.  These Care Teams include your primary Cardiologist (physician) and Advanced Practice Providers (APPs -  Physician Assistants and Nurse Practitioners) who all work together to provide you with the care you need, when you need it. You may see Dr Gwenlyn Found or one of the following Advanced Practice Providers on your designated Care Team:    Kerin Ransom, PA-C  [place], [place]  Coletta Memos, [place]  Your physician wants you to follow-up in: 3 months with a Physicians Assistant  Your physician wants you to follow-up in: 6 months with Dr. Gwenlyn Found   Any Other Special Instructions Will Be Listed Below (If Applicable). Keep a Blood Pressure Log for 30 days and make an appointment with Erasmo Downer for 1 month to discuss.

## 2019-04-28 ENCOUNTER — Ambulatory Visit: Payer: Self-pay | Admitting: Cardiovascular Disease

## 2019-05-20 MED FILL — !VIAGRA 25 MG TABLET: 25 | 30 days supply | Qty: 10 | Fill #3

## 2019-05-20 MED FILL — ?ERGOCALCIFEROL 50000 UNITC: 1.25 MG | 84 days supply | Qty: 12 | Fill #1

## 2019-05-25 ENCOUNTER — Ambulatory Visit: Payer: Self-pay | Admitting: Family Medicine

## 2019-05-28 ENCOUNTER — Ambulatory Visit (HOSPITAL_COMMUNITY)
Admission: EM | Admit: 2019-05-28 | Discharge: 2019-05-28 | Disposition: A | Discharge status: Home / Self Care | Payer: HRSA Program | Attending: Family Medicine | Admitting: Family Medicine

## 2019-05-28 ENCOUNTER — Encounter (HOSPITAL_COMMUNITY): Payer: Self-pay

## 2019-05-28 DIAGNOSIS — Z20822 Contact with and (suspected) exposure to covid-19: Secondary | ICD-10-CM | POA: Diagnosis present

## 2019-05-28 DIAGNOSIS — U071 COVID-19: Secondary | ICD-10-CM | POA: Diagnosis not present

## 2019-05-28 DIAGNOSIS — H6121 Impacted cerumen, right ear: Secondary | ICD-10-CM | POA: Insufficient documentation

## 2019-05-28 NOTE — ED Triage Notes (Signed)
Pt reports having right ear pain x 4 days; fever last night. Pt wants a COVID test.

## 2019-05-28 NOTE — ED Provider Notes (Signed)
[facility]    CSN: [id number] Arrival date & time: 05/28/19  1018      History   Chief Complaint Chief Complaint  Patient presents with  . Otalgia    HPI [name] is a 48 y.o. male.   [name] presents with complaints of ear pain. Right ear is painful, worse with eating/chewing. Clogged sensation. No decrease in hearing. No pain if not eating/drinking. Started a few days ago. Comes/goes. No known fevers. No cough, no runny nose, no sore throat. Hasn't taken any medications for symptoms. No known ill contacts. Has had similar in the past and required cerumen removal. Requesting covid-19 screening. Denies any known exposure.    ROS per HPI, negative if not otherwise mentioned.      Past Medical History:  Diagnosis Date  . Anxiety 02/2019  . Diabetes mellitus type II, uncontrolled ([place]) 07/17/2006  . Elevated alkaline phosphatase level 01/2019  . ESSENTIAL HYPERTENSION 07/17/2006  . HYPERLIPIDEMIA 11/24/2008  . Insomnia 02/2019  . Left bundle branch block 02/22/2019  . Pneumonia 10/22/2011  . Suicidal ideations 02/2019  . Vitamin D deficiency 01/2019    Patient Active Problem List   Diagnosis Date Noted  . Left ventricular dysfunction 04/27/2019  . Hypertensive urgency 04/07/2019  . Chest pain 04/07/2019  . Dyspnea on exertion 03/04/2019  . Elevated glucose 07/29/2018  . Muscle spasms of both lower extremities 07/29/2018  . Skin rash 07/29/2018  . 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 Healthcare maintenance 07/02/2016  . New onset left bundle branch block (LBBB)   . 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 Hyperlipidemia LDL goal <70 11/24/2008  . Uncontrolled type 2 diabetes mellitus with peripheral neuropathy ([place]) 07/17/2006  . Essential hypertension 07/17/2006    Past Surgical History:  Procedure Laterality Date  . ARM SURGERY         Home Medications    Prior to Admission medications   Medication Sig Start  Date End Date Taking? Authorizing Provider  ALPRAZolam [name]) 0.5 MG tablet Take 1 tablet (0.5 mg total) by mouth at bedtime as needed for anxiety. 02/22/19   [name], FNP  atorvastatin (LIPITOR) 20 MG tablet Take 1 tablet (20 mg total) by mouth daily. Patient taking differently: Take 20 mg by mouth as needed (cholesterol).  07/29/18   [name], FNP  Blood Glucose Monitoring Suppl (TRUE METRIX METER) w/Device KIT Use as instructed 01/29/19   [name], FNP  carvedilol (COREG) 6.25 MG tablet Take 1 tablet (6.25 mg total) by mouth 2 (two) times daily. 04/27/19   Lorretta Harp, MD  cyclobenzaprine (FLEXERIL) 10 MG tablet Take 1 tablet (10 mg total) by mouth 2 (two) times daily as needed for muscle spasms. 07/29/18   [name], FNP  gabapentin (NEURONTIN) 300 MG capsule Take 1 capsule (300 mg total) by mouth 3 (three) times daily. 07/29/18   [name], FNP  glucose blood test strip Use to check fasting blood sugar once daily. 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 Insulin dependent 06/10/17   Gildardo Pounds, NP  insulin detemir (LEVEMIR) 100 UNIT/ML injection Inject 0.3 mLs (30 Units total) into the skin daily. 07/29/18   [name], FNP  insulin lispro (HUMALOG) 100 UNIT/ML injection Inject 20 units under skin, 2 times a day. 10/14/18 10/14/19  [name], FNP  Insulin Pen Needle (B-D UF III MINI PEN NEEDLES) 31G X 5 MM MISC Use as instructed 09/08/17   Raul Del,  Vernia Buff, NP  Insulin Syringes, Disposable, U-100 0.3 ML MISC 100 Syringes by Does not apply route 3 (three) times daily with meals as needed. 07/29/18   [name], FNP  Lancets (ACCU-CHEK SOFT TOUCH) lancets Use as instructed 10/14/18   [name], FNP  lisinopril (ZESTRIL) 20 MG tablet Take 1 tablet (20 mg total) by mouth daily. 04/27/19   Lorretta Harp, MD  metFORMIN (GLUCOPHAGE) 1000 MG tablet Take 1 tablet (1,000 mg total) by mouth 2 (two) times daily with a meal. 01/22/19   [name], FNP   metoprolol tartrate (LOPRESSOR) 100 MG tablet Take 2 hours prior to procedure 03/30/19   Lorretta Harp, MD  sildenafil (VIAGRA) 25 MG tablet Take 1 tablet (25 mg total) by mouth as needed for erectile dysfunction. for erectile dysfunction. 01/22/19   [name], FNP  triamcinolone cream (KENALOG) 0.1 % Apply 1 application topically 2 (two) times daily. 07/29/18   [name], FNP  TRUEplus Lancets 28G MISC Use as instructed 10/14/18   [name], FNP  Vitamin D, Ergocalciferol, (DRISDOL) 1.25 MG (50000 UT) CAPS capsule Take 1 capsule (50,000 Units total) by mouth every 7 (seven) days. Patient taking differently: Take 50,000 Units by mouth every 7 (seven) days. Wednesdays 02/15/19   [name], FNP    Family History Family History  Problem Relation Age of Onset  . Hypertension Mother 78  . Cancer Mother        bladder cancer  . Alcohol abuse Father   . Hypertension Brother     Social History Social History   Tobacco Use  . Smoking status: Never Smoker  . Smokeless tobacco: Never Used  Substance Use Topics  . Alcohol use: No    Alcohol/week: 0.0 standard drinks  . Drug use: No     Allergies   Sulfa antibiotics, Bactrim [sulfamethoxazole-trimethoprim], Cephalexin, and Other   Review of Systems Review of Systems   Physical Exam Triage Vital Signs ED Triage Vitals  Enc Vitals Group     BP 05/28/19 1046 (!) 165/102     Pulse Rate 05/28/19 1046 98     Resp 05/28/19 1046 16     Temp 05/28/19 1046 100 F (37.8 C)     Temp Source 05/28/19 1046 Oral     SpO2 05/28/19 1046 100 %     Weight --      Height --      Head Circumference --      Peak Flow --      Pain Score 05/28/19 1044 2     Pain Loc --      Pain Edu? --      Excl. in [place]? --    No data found.  Updated Vital Signs BP (!) 165/102 (BP Location: Left Arm)   Pulse 98   Temp 100 F (37.8 C) (Oral)   Resp 16   SpO2 100%    Physical Exam Constitutional:      Appearance: He is  well-developed.  HENT:     Right Ear: There is impacted cerumen.     Left Ear: Tympanic membrane and ear canal normal.     Ears:     Comments: Right ear canal irrigated by nursing staff with removal of cerumen and then normal exam- canal and TM WNL; patient feels improved Cardiovascular:     Rate and Rhythm: Normal rate.  Pulmonary:     Effort: Pulmonary effort is normal.  Skin:  General: Skin is warm and dry.  Neurological:     Mental Status: He is alert and oriented to person, place, and time.      UC Treatments / Results  Labs (all labs ordered are listed, but only abnormal results are displayed) Labs Reviewed  NOVEL CORONAVIRUS, NAA (HOSP ORDER, SEND-OUT TO REF LAB; TAT 18-24 HRS)    EKG   Radiology No results found.  Procedures Procedures (including critical care time)  Medications Ordered in UC Medications - No data to display  Initial Impression / Assessment and Plan / UC Course  I have reviewed the triage vital signs and the nursing notes.  Pertinent labs & imaging results that were available during my care of the patient were reviewed by me and considered in my medical decision making (see chart for details).     Cerumen impaction with successful irrigation and removal per nursing staff. covid screening collected and pending, patient denies any symptoms. Return precautions provided. Patient verbalized understanding and agreeable to plan.  Final Clinical Impressions(s) / UC Diagnoses   Final diagnoses:  Impacted cerumen of right ear  Encounter for screening laboratory testing for COVID-19 virus     Discharge Instructions     Self isolate until covid results are back and negative.  Will notify you by phone of any positive findings. Your negative results will be sent through your MyChart.        ED Prescriptions    None     PDMP not reviewed this encounter.   Zigmund Gottron, NP 05/28/19 [phone]

## 2019-05-28 NOTE — ED Notes (Signed)
I called the pt and he is not in the waiting room.

## 2019-05-28 NOTE — Discharge Instructions (Addendum)
Self isolate until covid results are back and negative.  °Will notify you by phone of any positive findings. Your negative results will be sent through your MyChart.     ° °

## 2019-05-29 ENCOUNTER — Ambulatory Visit: Payer: Self-pay

## 2019-05-29 LAB — NOVEL CORONAVIRUS, NAA (HOSP ORDER, SEND-OUT TO REF LAB; TAT 18-24 HRS): SARS-CoV-2, NAA: DETECTED — AB

## 2019-05-29 NOTE — Telephone Encounter (Signed)
Pt given Covid-19 positive results. Discussed mild, moderate and severe symptoms. Advised pt to call 911 for any respiratory issues and/dehydration. Discussed non test criteria for ending self isolation. Pt advised of way to manage symptoms at home and review isolation precautions especially the importance of washing hands frequently and wearing a mask when around others. Pt verbalized understanding. Will report to HD.  Reason for Disposition . Health Information question, no triage required and triager able to answer question  Answer Assessment - Initial Assessment Questions 1. REASON FOR CALL or QUESTION: "What is your reason for calling today?" or "How can I best help you?" or "What question do you have that I can help answer?"     Results  Protocols used: INFORMATION ONLY CALL - NO TRIAGE-A-AH

## 2019-05-31 ENCOUNTER — Telehealth: Payer: Self-pay

## 2019-05-31 ENCOUNTER — Encounter (HOSPITAL_COMMUNITY): Payer: Self-pay

## 2019-05-31 NOTE — Telephone Encounter (Signed)
Pt's mother Olivia Mackie called [phone] was saying patient was just diagnosed with covid and is asking if there are any medications he should be taking. She was upset that he didn't receive anything from Urgent care when diagnosed. Can you please advise.

## 2019-06-01 NOTE — Telephone Encounter (Signed)
Patient's mother stated yesterday that his fever was approximately 99.8 and that he had a cough. She was just upset that he wasn't given any meds, and she was requesting a call back from you to advise if he should be given anything. I explained to her yesterday that COVID is viral and most medications are just to help alleviate symptoms. She was still requesting a call back from you. Please advise.

## 2019-06-02 ENCOUNTER — Telehealth: Payer: Self-pay | Admitting: Family Medicine

## 2019-06-02 ENCOUNTER — Other Ambulatory Visit: Payer: Self-pay | Admitting: Family Medicine

## 2019-06-02 DIAGNOSIS — R05 Cough: Secondary | ICD-10-CM

## 2019-06-02 DIAGNOSIS — R0989 Other specified symptoms and signs involving the circulatory and respiratory systems: Secondary | ICD-10-CM

## 2019-06-02 DIAGNOSIS — R059 Cough, unspecified: Secondary | ICD-10-CM

## 2019-06-02 MED ORDER — AMOXICILLIN-POT CLAVULANATE 875-125 MG PO TABS: 1.0000 | ORAL_TABLET | Freq: Two times a day (BID) | ORAL | 0 refills | Status: AC

## 2019-06-02 MED ORDER — BENZONATATE 100 MG PO CAPS: 100.0000 mg | ORAL_CAPSULE | Freq: Two times a day (BID) | ORAL | 0 refills | Status: DC | PRN

## 2019-06-02 MED FILL — AMOX-CLAV 875-125 MG TABLET: 875-125 | 10 days supply | Qty: 20 | Fill #0

## 2019-06-02 MED FILL — BENZONATATE 100 MG CAPS: 100 | 10 days supply | Qty: 20 | Fill #0

## 2019-06-02 NOTE — Telephone Encounter (Signed)
NP talked to mother

## 2019-06-03 ENCOUNTER — Ambulatory Visit: Payer: Self-pay

## 2019-06-05 ENCOUNTER — Emergency Department (HOSPITAL_COMMUNITY)
Admission: EM | Admit: 2019-06-05 | Discharge: 2019-06-05 | Discharge status: Against Medical Advice | Payer: Self-pay | Attending: Emergency Medicine | Admitting: Emergency Medicine

## 2019-06-05 ENCOUNTER — Emergency Department (HOSPITAL_COMMUNITY): Payer: Self-pay

## 2019-06-05 ENCOUNTER — Encounter (HOSPITAL_COMMUNITY): Payer: Self-pay | Admitting: *Deleted

## 2019-06-05 ENCOUNTER — Other Ambulatory Visit: Payer: Self-pay

## 2019-06-05 DIAGNOSIS — I160 Hypertensive urgency: Secondary | ICD-10-CM | POA: Insufficient documentation

## 2019-06-05 DIAGNOSIS — N179 Acute kidney failure, unspecified: Secondary | ICD-10-CM | POA: Insufficient documentation

## 2019-06-05 DIAGNOSIS — Z5320 Procedure and treatment not carried out because of patient's decision for unspecified reasons: Secondary | ICD-10-CM | POA: Insufficient documentation

## 2019-06-05 DIAGNOSIS — Z79899 Other long term (current) drug therapy: Secondary | ICD-10-CM | POA: Insufficient documentation

## 2019-06-05 DIAGNOSIS — E1140 Type 2 diabetes mellitus with diabetic neuropathy, unspecified: Secondary | ICD-10-CM | POA: Insufficient documentation

## 2019-06-05 DIAGNOSIS — Z7984 Long term (current) use of oral hypoglycemic drugs: Secondary | ICD-10-CM | POA: Insufficient documentation

## 2019-06-05 DIAGNOSIS — U071 COVID-19: Secondary | ICD-10-CM | POA: Insufficient documentation

## 2019-06-05 LAB — CBC WITH DIFFERENTIAL/PLATELET
Abs Immature Granulocytes: 0.04 10*3/uL (ref 0.00–0.07)
Basophils Absolute: 0 10*3/uL (ref 0.0–0.1)
Basophils Relative: 0 %
Eosinophils Absolute: 0.1 10*3/uL (ref 0.0–0.5)
Eosinophils Relative: 1 %
HCT: 32.1 % — ABNORMAL LOW (ref 39.0–52.0)
Hemoglobin: 10.6 g/dL — ABNORMAL LOW (ref 13.0–17.0)
Immature Granulocytes: 1 %
Lymphocytes Relative: 24 %
Lymphs Abs: 1.6 10*3/uL (ref 0.7–4.0)
MCH: 27.4 pg (ref 26.0–34.0)
MCHC: 33 g/dL (ref 30.0–36.0)
MCV: 82.9 fL (ref 80.0–100.0)
Monocytes Absolute: 0.7 10*3/uL (ref 0.1–1.0)
Monocytes Relative: 11 %
Neutro Abs: 4.3 10*3/uL (ref 1.7–7.7)
Neutrophils Relative %: 63 %
Platelets: 412 10*3/uL — ABNORMAL HIGH (ref 150–400)
RBC: 3.87 MIL/uL — ABNORMAL LOW (ref 4.22–5.81)
RDW: 12 % (ref 11.5–15.5)
WBC: 6.8 10*3/uL (ref 4.0–10.5)
nRBC: 0 % (ref 0.0–0.2)

## 2019-06-05 LAB — COMPREHENSIVE METABOLIC PANEL
ALT: 25 U/L (ref 0–44)
AST: 50 U/L — ABNORMAL HIGH (ref 15–41)
Albumin: 2.1 g/dL — ABNORMAL LOW (ref 3.5–5.0)
Alkaline Phosphatase: 105 U/L (ref 38–126)
Anion gap: 11 (ref 5–15)
BUN: 29 mg/dL — ABNORMAL HIGH (ref 6–20)
CO2: 28 mmol/L (ref 22–32)
Calcium: 8.1 mg/dL — ABNORMAL LOW (ref 8.9–10.3)
Chloride: 91 mmol/L — ABNORMAL LOW (ref 98–111)
Creatinine, Ser: 2.05 mg/dL — ABNORMAL HIGH (ref 0.61–1.24)
GFR calc Af Amer: 44 mL/min — ABNORMAL LOW (ref >60)
GFR calc non Af Amer: 38 mL/min — ABNORMAL LOW (ref >60)
Glucose, Bld: 484 mg/dL — ABNORMAL HIGH (ref 70–99)
Potassium: 5.6 mmol/L — ABNORMAL HIGH (ref 3.5–5.1)
Sodium: 130 mmol/L — ABNORMAL LOW (ref 135–145)
Total Bilirubin: 1 mg/dL (ref 0.3–1.2)

## 2019-06-05 LAB — CBG MONITORING, ED: Glucose-Capillary: 477 mg/dL — ABNORMAL HIGH (ref 70–99)

## 2019-06-05 LAB — TROPONIN I (HIGH SENSITIVITY): Troponin I (High Sensitivity): 46 ng/L — ABNORMAL HIGH (ref <18)

## 2019-06-05 MED ORDER — ACETAMINOPHEN 325 MG PO TABS
650.0000 mg | ORAL_TABLET | Freq: Once | ORAL | Status: AC
  Administered 2019-06-05: 650 mg via ORAL
  Filled 2019-06-05: qty 2

## 2019-06-05 MED ORDER — CARVEDILOL 12.5 MG PO TABS
6.2500 mg | ORAL_TABLET | Freq: Once | ORAL | Status: AC
  Administered 2019-06-05: 6.25 mg via ORAL
  Filled 2019-06-05: qty 1

## 2019-06-05 MED ORDER — BENZONATATE 100 MG PO CAPS: 100.0000 mg | ORAL_CAPSULE | Freq: Three times a day (TID) | ORAL | 0 refills | Status: DC

## 2019-06-05 NOTE — Discharge Instructions (Addendum)
As discussed, your blood pressure and glucose were very elevated today. Stop taking Lisinopril for the next few days until your follow-up with your PCP given your renal function was slightly decreased today. Continue drinking a lot of fluids. Take your insulin right when you get home. You may continue to take your Coreg as prescribed. I am also sending you home with cough medication as needed for your cough. Follow-up with your PCP within the next week to recheck blood pressure and your glucose levels. Return to the ER for new or worsening symptoms.

## 2019-06-05 NOTE — ED Triage Notes (Signed)
The pt was here one week ago and tested positive for the covid  hes here today because he is a diabetic and he continues to cough and he wants to be tested for covid again  His gorlfriend is with him wants to ge t test

## 2019-06-05 NOTE — ED Notes (Signed)
Pt maintained O2 sats between 96-97% while ambulating around the room .

## 2019-06-05 NOTE — ED Provider Notes (Signed)
[facility] EMERGENCY DEPARTMENT Provider Note   CSN: [id number] Arrival date & time: 06/05/19  1533     History Chief Complaint  Patient presents with  . Cough  . pos for covid asnts to be rechecked    [name] is a 50 y.o. male with a past medical history significant for anxiety, uncontrolled diabetes, hypertension, hyperlipidemia, and vitamin D deficiency who presents to the ED due to persistent cough x1 week.  Chart reviewed. Patient was seen on 05/28/2019 at urgent care and tested positive for COVID-19.  Patient admits to a persistent cough that has not gone away.  He first became symptomatic last Monday.  Patient denies chest pain and shortness of breath.  He admits to a subjective fever last week which he notes was 100 and 66 F, but he states his temperature "never got over 100F". Patient has tried Tylenol for his symptoms with moderate relif, but nothing else. Patient notes he has not taken his insulin or hypertension medication for the past few days. He states that his blood pressure has been "normal" recently, so he did not think he needed to take his blood pressure medication when his pressures were normal. Patient denies headache, changes to vision, abdominal pain, nausea, vomiting, and diarrhea. Patient denies history of blood clots, hemoptysis, recent surgeries, and recent immobilizations.     Past Medical History:  Diagnosis Date  . Anxiety 02/2019  . Diabetes mellitus type II, uncontrolled ([place]) 07/17/2006  . Elevated alkaline phosphatase level 01/2019  . ESSENTIAL HYPERTENSION 07/17/2006  . HYPERLIPIDEMIA 11/24/2008  . Insomnia 02/2019  . Left bundle branch block 02/22/2019  . Pneumonia 10/22/2011  . Suicidal ideations 02/2019  . Vitamin D deficiency 01/2019    Patient Active Problem List   Diagnosis Date Noted  . Left ventricular dysfunction 04/27/2019  . Hypertensive urgency 04/07/2019  . Chest pain 04/07/2019  . Dyspnea on exertion  03/04/2019  . Elevated glucose 07/29/2018  . Muscle spasms of both lower extremities 07/29/2018  . Skin rash 07/29/2018  . 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 Healthcare maintenance 07/02/2016  . New onset left bundle branch block (LBBB)   . 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 Hyperlipidemia LDL goal <70 11/24/2008  . Uncontrolled type 2 diabetes mellitus with peripheral neuropathy ([place]) 07/17/2006  . Essential hypertension 07/17/2006    Past Surgical History:  Procedure Laterality Date  . ARM SURGERY         Family History  Problem Relation Age of Onset  . Hypertension Mother 65  . Cancer Mother        bladder cancer  . Alcohol abuse Father   . Hypertension Brother     Social History   Tobacco Use  . Smoking status: Never Smoker  . Smokeless tobacco: Never Used  Substance Use Topics  . Alcohol use: No    Alcohol/week: 0.0 standard drinks  . Drug use: No    Home Medications Prior to Admission medications   Medication Sig Start Date End Date Taking? Authorizing Provider  ALPRAZolam Duanne Moron) 0.5 MG tablet Take 1 tablet (0.5 mg total) by mouth at bedtime as needed for anxiety. 02/22/19  Yes [name], FNP  amoxicillin-clavulanate (AUGMENTIN) 875-125 MG tablet Take 1 tablet by mouth 2 (two) times daily for 10 days. 06/02/19 06/12/19 Yes [name], FNP  atorvastatin (LIPITOR) 20 MG tablet Take 1 tablet (20 mg total) by mouth daily. Patient taking differently: Take 20 mg by mouth as  needed (cholesterol).  07/29/18  Yes [name], FNP  benzonatate (TESSALON) 100 MG capsule Take 1 capsule (100 mg total) by mouth 2 (two) times daily as needed for cough. 06/02/19  Yes [name], FNP  Blood Glucose Monitoring Suppl (TRUE METRIX METER) w/Device KIT Use as instructed 01/29/19  Yes [name], FNP  carvedilol (COREG) 6.25 MG tablet Take 1 tablet (6.25 mg total) by mouth 2 (two) times daily. 04/27/19  Yes Lorretta Harp, MD   gabapentin (NEURONTIN) 300 MG capsule Take 1 capsule (300 mg total) by mouth 3 (three) times daily. 07/29/18  Yes [name], FNP  glucose blood test strip Use to check fasting blood sugar once daily. 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 Insulin dependent 06/10/17  Yes Gildardo Pounds, NP  insulin detemir (LEVEMIR) 100 UNIT/ML injection Inject 0.3 mLs (30 Units total) into the skin daily. 07/29/18  Yes [name], FNP  insulin lispro (HUMALOG) 100 UNIT/ML injection Inject 20 units under skin, 2 times a day. Patient taking differently: Inject 20 Units into the skin 2 (two) times daily.  10/14/18 10/14/19 Yes [name], FNP  Insulin Pen Needle (B-D UF III MINI PEN NEEDLES) 31G X 5 MM MISC Use as instructed 09/08/17  Yes Gildardo Pounds, NP  Insulin Syringes, Disposable, U-100 0.3 ML MISC 100 Syringes by Does not apply route 3 (three) times daily with meals as needed. 07/29/18  Yes [name], FNP  Lancets (ACCU-CHEK SOFT TOUCH) lancets Use as instructed 10/14/18  Yes [name], FNP  lisinopril (ZESTRIL) 20 MG tablet Take 1 tablet (20 mg total) by mouth daily. 04/27/19  Yes Lorretta Harp, MD  metFORMIN (GLUCOPHAGE) 1000 MG tablet Take 1 tablet (1,000 mg total) by mouth 2 (two) times daily with a meal. 01/22/19  Yes [name], FNP  sildenafil (VIAGRA) 25 MG tablet Take 1 tablet (25 mg total) by mouth as needed for erectile dysfunction. for erectile dysfunction. 01/22/19  Yes [name], FNP  triamcinolone cream (KENALOG) 0.1 % Apply 1 application topically 2 (two) times daily. Patient taking differently: Apply 1 application topically 2 (two) times daily as needed (itching).  07/29/18  Yes [name], FNP  TRUEplus Lancets 28G MISC Use as instructed 10/14/18  Yes [name], FNP  Vitamin D, Ergocalciferol, (DRISDOL) 1.25 MG (50000 UT) CAPS capsule Take 1 capsule (50,000 Units total) by mouth every 7 (seven) days. Patient taking differently: Take 50,000 Units by mouth  every 7 (seven) days. Wednesdays 02/15/19  Yes [name], FNP  benzonatate (TESSALON) 100 MG capsule Take 1 capsule (100 mg total) by mouth every 8 (eight) hours. 06/05/19   Suzy Bouchard, PA-C  cyclobenzaprine (FLEXERIL) 10 MG tablet Take 1 tablet (10 mg total) by mouth 2 (two) times daily as needed for muscle spasms. Patient not taking: Reported on 06/05/2019 07/29/18   [name], FNP  metoprolol tartrate (LOPRESSOR) 100 MG tablet Take 2 hours prior to procedure Patient not taking: Reported on 06/05/2019 03/30/19   Lorretta Harp, MD    Allergies    Sulfa antibiotics, Bactrim [sulfamethoxazole-trimethoprim], Cephalexin, and Other  Review of Systems   Review of Systems  Constitutional: Positive for fever (subjective). Negative for chills.  HENT: Negative for congestion and sore throat.   Respiratory: Positive for cough. Negative for shortness of breath.   Cardiovascular: Negative for chest pain and leg swelling.  Gastrointestinal: Negative for abdominal pain, diarrhea, nausea and vomiting.  Genitourinary: Negative for dysuria.  All other systems reviewed and are negative.   Physical Exam Updated Vital Signs BP (!) 196/106   Pulse 100   Temp 99.7 F (37.6 C) (Oral)   Resp 20   Ht 5' 7"  (1.702 m)   Wt 65.8 kg   SpO2 96%   BMI 22.72 kg/m   Physical Exam Vitals and nursing note reviewed.  Constitutional:      General: He is not in acute distress.    Appearance: He is not ill-appearing.  HENT:     Head: Normocephalic.  Eyes:     Pupils: Pupils are equal, round, and reactive to light.  Neck:     Comments: No meningismus Cardiovascular:     Rate and Rhythm: Regular rhythm. Tachycardia present.     Pulses: Normal pulses.     Heart sounds: Normal heart sounds. No murmur. No friction rub. No gallop.   Pulmonary:     Effort: Pulmonary effort is normal.     Breath sounds: Normal breath sounds.     Comments: Respirations equal and unlabored, patient able  to speak in full sentences, lungs clear to auscultation bilaterally Abdominal:     General: Abdomen is flat. Bowel sounds are normal. There is no distension.     Palpations: Abdomen is soft.     Tenderness: There is no abdominal tenderness. There is no guarding or rebound.     Comments: Abdomen soft, nondistended, nontender to palpation in all quadrants without guarding or peritoneal signs. No rebound.   Musculoskeletal:     Cervical back: Neck supple.     Right lower leg: No edema.     Left lower leg: No edema.     Comments: Able to move all 4 extremities without difficulty. No lower extremity edema. Negative homans sign bilaterally  Skin:    General: Skin is warm.  Neurological:     General: No focal deficit present.     Mental Status: He is alert.     Comments: Speech is clear, able to follow commands CN III-XII intact Normal strength in upper and lower extremities bilaterally including dorsiflexion and plantar flexion, strong and equal grip strength Sensation grossly intact throughout Moves extremities without ataxia, coordination intact No pronator drift Ambulates without difficulty   Psychiatric:        Mood and Affect: Mood normal.     ED Results / Procedures / Treatments   Labs (all labs ordered are listed, but only abnormal results are displayed) Labs Reviewed  CBC WITH DIFFERENTIAL/PLATELET - Abnormal; Notable for the following components:      Result Value   RBC 3.87 (*)    Hemoglobin 10.6 (*)    HCT 32.1 (*)    Platelets 412 (*)    All other components within normal limits  COMPREHENSIVE METABOLIC PANEL - Abnormal; Notable for the following components:   Sodium 130 (*)    Potassium 5.6 (*)    Chloride 91 (*)    Glucose, Bld 484 (*)    BUN 29 (*)    Creatinine, Ser 2.05 (*)    Calcium 8.1 (*)    Albumin 2.1 (*)    AST 50 (*)    GFR calc non Af Amer 38 (*)    GFR calc Af Amer 44 (*)    All other components within normal limits  CBG MONITORING, ED -  Abnormal; Notable for the following components:   Glucose-Capillary 477 (*)    All other components within normal  limits  TROPONIN I (HIGH SENSITIVITY) - Abnormal; Notable for the following components:   Troponin I (High Sensitivity) 46 (*)    All other components within normal limits  URINALYSIS, ROUTINE W REFLEX MICROSCOPIC    EKG EKG Interpretation  Date/Time:  Saturday June 05 2019 17:48:23 EST Ventricular Rate:  98 PR Interval:    QRS Duration: 149 QT Interval:  415 QTC Calculation: 530 R Axis:   120 Text Interpretation: Sinus rhythm Left bundle branch block similar compared to prior Confirmed by Quintella Reichert [phone]) on 06/05/2019 5:55:16 PM   Radiology DG Chest Portable 1 View  Result Date: 06/05/2019 CLINICAL DATA:  49 year old male with cough.  Positive COVID-19. EXAM: PORTABLE CHEST 1 VIEW COMPARISON:  Chest radiograph dated 04/07/2019. FINDINGS: There is no focal consolidation, pleural effusion, pneumothorax. Stable mild cardiomegaly. No acute osseous pathology. IMPRESSION: 1. No acute cardiopulmonary process. 2. Mild cardiomegaly. Electronically Signed   By: Anner Crete M.D.   On: 06/05/2019 17:42    Procedures Procedures (including critical care time)  Medications Ordered in ED Medications  acetaminophen (TYLENOL) tablet 650 mg (650 mg Oral Given 06/05/19 1826)  carvedilol (COREG) tablet 6.25 mg (6.25 mg Oral Given 06/05/19 1929)    ED Course  I have reviewed the triage vital signs and the nursing notes.  Pertinent labs & imaging results that were available during my care of the patient were reviewed by me and considered in my medical decision making (see chart for details).    MDM Rules/Calculators/A&P                     49 year old male presents to the ED due to worsening cough x1 week.  Chart reviewed.  Patient was seen on 05/28/2019 and tested positive for COVID-19.  Patient denies chest pain and shortness of breath. Patient has not been complaint  with his diabetic and hypertension medication. Patient borderline febrile at 99.7, tachycardic at 104, and not hypoxic. Patient's blood pressure very elevated at 197/110 likely due noncompliance. Patient is currently asymptomatic. Physical exam reassuring. Lungs clear to auscultation bilaterally. Abdomen soft, non-distended, and non-tender. No lower extremity edema. Negative homans sign bilaterally. Normal neurological exam. Will obtain routine labs to rule out end organ damage. POC CBG 477, will check labs to rule out DKA. Patient ambulated in ED and maintained O2 sats between 96-97%. Suspect cough related to COVID infection.   CBC significant for hemoglobin at 10.6 which is slightly lower than baseline.  Patient denies hematemesis, hematochezia, and melena.  CBC also significant for mildly elevated platelets at 412, but otherwise unremarkable with no leukocytosis.  CMP significant for worsening renal function with creatinine at 2.05 and BUN at 29.  Hyperglycemia at 484 no anion gap, doubt DKA.  Hyponatremia at 130.  Hyperkalemia at 5.6.  Initial troponin 46, but troponin is chronically elevated and this appears lower than normal. Chest x-ray personally reviewed which is negative for signs of pneumonia, but significant for mild cardiomegaly likely due to chronic uncontrolled hypertension.  EKG reviewed which demonstrates sinus rhythm with a left bundle branch block, but no significant changes from prior EKG.  Discussed case with Dr. Ralene Bathe who evaluated patient at bedside and agrees with assessment and plan.  Will give patient's home meds of Coreg 6.25 mg for elevated blood pressure, but will hold lisinopril due to worsening renal function.  Informed by Dr. Ralene Bathe and RN that patient would like to leave AMA. I have discussed my concerns as his provider and  the possibility that this may worsen. I have specifically discussed that without further evaluation I cannot guarantee there is not a life threatening event  occuring.  Pt is A&Ox4, his own POA and states understanding of my concerns and the possible consequences.  I have made pt aware that this is an [facility] discharge, but he may return at any time for further evaluation and treatment. Patient has been advised to hold Lisinopril given his AKI, but to continue taking his other BP medications. Patient advised to take his insulin right when he gets home and to follow-up with PCP within the next week for further evaluation. Strict ED precautions discussed with patient. Patient states understanding and agrees to plan. Patient discharged home in no acute distress and stable vitals  Final Clinical Impression(s) / ED Diagnoses Final diagnoses:  Hypertensive urgency  Acute renal injury ([place])  COVID-19 virus infection    Rx / DC Orders ED Discharge Orders         Ordered    benzonatate (TESSALON) 100 MG capsule  Every 8 hours     06/05/19 1900           Karie Kirks 06/05/19 2218    Quintella Reichert, MD 06/07/19 [phone]

## 2019-06-07 MED FILL — BENZONATATE 100 MG CAPS: 100 | 7 days supply | Qty: 21 | Fill #0

## 2019-06-15 ENCOUNTER — Ambulatory Visit: Payer: Self-pay | Admitting: Family Medicine

## 2019-06-15 ENCOUNTER — Telehealth: Payer: Self-pay | Admitting: Family Medicine

## 2019-06-15 ENCOUNTER — Other Ambulatory Visit: Payer: Self-pay

## 2019-06-15 NOTE — Telephone Encounter (Signed)
Attempted to contact patient today for Telephone Virtual Visit. patient recently diagnosed with Coronavirus on 06/05/2019. Antibiotic and cough medications called in at that time. Mother states that patient currently resides in hotel and is doing well. Message left with patient's mother to contact office for follow up.     Kathe Becton,  MSN, FNP-BC [facility] [address] [place], [place] [address] [phone] fax

## 2019-06-18 ENCOUNTER — Ambulatory Visit (HOSPITAL_COMMUNITY)
Admission: EM | Admit: 2019-06-18 | Discharge: 2019-06-18 | Disposition: A | Discharge status: Home / Self Care | Payer: Self-pay | Attending: Urgent Care | Admitting: Urgent Care

## 2019-06-18 ENCOUNTER — Encounter (HOSPITAL_COMMUNITY): Payer: Self-pay | Admitting: Emergency Medicine

## 2019-06-18 ENCOUNTER — Other Ambulatory Visit: Payer: Self-pay

## 2019-06-18 DIAGNOSIS — M542 Cervicalgia: Secondary | ICD-10-CM

## 2019-06-18 DIAGNOSIS — R000 Tachycardia, unspecified: Secondary | ICD-10-CM

## 2019-06-18 DIAGNOSIS — E1165 Type 2 diabetes mellitus with hyperglycemia: Secondary | ICD-10-CM

## 2019-06-18 DIAGNOSIS — M62838 Other muscle spasm: Secondary | ICD-10-CM

## 2019-06-18 MED ORDER — CYCLOBENZAPRINE HCL 5 MG PO TABS: 5.0000 mg | ORAL_TABLET | Freq: Two times a day (BID) | ORAL | 0 refills | Status: DC | PRN

## 2019-06-18 MED ORDER — NAPROXEN 500 MG PO TABS: 500.0000 mg | ORAL_TABLET | Freq: Two times a day (BID) | ORAL | 0 refills | Status: DC

## 2019-06-18 NOTE — ED Provider Notes (Signed)
[name]   MRN: [id number] DOB: [birth year]  Subjective:   [name] is a 49 y.o. male presenting for 2 day hx of acute onset persistent intermittent sharp right sided neck pain, tightness. No inciting factors noted. Patient denies falls, mva, weakness, tingling. Has used APAP and Advil once. Has uncontrolled diabetes, non-compliant diet, decreasing GFR. Has appt with his PCP on 06/29/2019.   No current facility-administered medications for this encounter.  Current Outpatient Medications:  .  carvedilol (COREG) 6.25 MG tablet, Take 1 tablet (6.25 mg total) by mouth 2 (two) times daily., Disp: 180 tablet, Rfl: 3 .  insulin detemir (LEVEMIR) 100 UNIT/ML injection, Inject 0.3 mLs (30 Units total) into the skin daily., Disp: 10 mL, Rfl: 11 .  Insulin Pen Needle (B-D UF III MINI PEN NEEDLES) 31G X 5 MM MISC, Use as instructed, Disp: 90 each, Rfl: 1 .  Insulin Syringes, Disposable, U-100 0.3 ML MISC, 100 Syringes by Does not apply route 3 (three) times daily with meals as needed., Disp: 100 each, Rfl: 6 .  ALPRAZolam (XANAX) 0.5 MG tablet, Take 1 tablet (0.5 mg total) by mouth at bedtime as needed for anxiety., Disp: 30 tablet, Rfl: 0 .  atorvastatin (LIPITOR) 20 MG tablet, Take 1 tablet (20 mg total) by mouth daily. (Patient taking differently: Take 20 mg by mouth as needed (cholesterol). ), Disp: 90 tablet, Rfl: 3 .  benzonatate (TESSALON) 100 MG capsule, Take 1 capsule (100 mg total) by mouth 2 (two) times daily as needed for cough., Disp: 20 capsule, Rfl: 0 .  benzonatate (TESSALON) 100 MG capsule, Take 1 capsule (100 mg total) by mouth every 8 (eight) hours., Disp: 21 capsule, Rfl: 0 .  Blood Glucose Monitoring Suppl (TRUE METRIX METER) w/Device KIT, Use as instructed, Disp: 1 kit, Rfl: 0 .  cyclobenzaprine (FLEXERIL) 10 MG tablet, Take 1 tablet (10 mg total) by mouth 2 (two) times daily as needed for muscle spasms. (Patient not taking: Reported on 06/05/2019), Disp: 30 tablet,  Rfl: 3 .  gabapentin (NEURONTIN) 300 MG capsule, Take 1 capsule (300 mg total) by mouth 3 (three) times daily., Disp: 90 capsule, Rfl: 3 .  glucose blood test strip, Use to check fasting blood sugar once daily. 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 Insulin dependent, Disp: 100 each, Rfl: 12 .  insulin lispro (HUMALOG) 100 UNIT/ML injection, Inject 20 units under skin, 2 times a day. (Patient taking differently: Inject 20 Units into the skin 2 (two) times daily. ), Disp: 10 mL, Rfl: 3 .  Lancets (ACCU-CHEK SOFT TOUCH) lancets, Use as instructed, Disp: 100 each, Rfl: 12 .  lisinopril (ZESTRIL) 20 MG tablet, Take 1 tablet (20 mg total) by mouth daily., Disp: 90 tablet, Rfl: 3 .  metFORMIN (GLUCOPHAGE) 1000 MG tablet, Take 1 tablet (1,000 mg total) by mouth 2 (two) times daily with a meal., Disp: 180 tablet, Rfl: 3 .  metoprolol tartrate (LOPRESSOR) 100 MG tablet, Take 2 hours prior to procedure (Patient not taking: Reported on 06/05/2019), Disp: 1 tablet, Rfl: 0 .  sildenafil (VIAGRA) 25 MG tablet, Take 1 tablet (25 mg total) by mouth as needed for erectile dysfunction. for erectile dysfunction., Disp: 30 tablet, Rfl: 3 .  triamcinolone cream (KENALOG) 0.1 %, Apply 1 application topically 2 (two) times daily. (Patient taking differently: Apply 1 application topically 2 (two) times daily as needed (itching). ), Disp: 30 g, Rfl: 3 .  TRUEplus Lancets 28G MISC, Use as instructed, Disp: 100 each, Rfl: 3 .  Vitamin D,  Ergocalciferol, (DRISDOL) 1.25 MG (50000 UT) CAPS capsule, Take 1 capsule (50,000 Units total) by mouth every 7 (seven) days. (Patient taking differently: Take 50,000 Units by mouth every 7 (seven) days. Wednesdays), Disp: 5 capsule, Rfl: 6   Allergies  Allergen Reactions  . Sulfa Antibiotics Rash    Severe rash. Do not give sulfa medications  . Bactrim [Sulfamethoxazole-Trimethoprim]     Rash  . Cephalexin     Patient with severe drug reaction including exfoliating skin rash and hypotension after being  prescribed both cephalexin and sulfamethoxazole-trimethoprim simultaneously. Favor SMX as much more likely culprit as patient had tolerated other cephalosporins in the past, but cannot say with complete certainty that this was not related to cephalexin.   . Other     Patient states he is allergic to the TB test    Past Medical History:  Diagnosis Date  . Anxiety 02/2019  . Diabetes mellitus type II, uncontrolled ([place]) 07/17/2006  . Elevated alkaline phosphatase level 01/2019  . ESSENTIAL HYPERTENSION 07/17/2006  . HYPERLIPIDEMIA 11/24/2008  . Insomnia 02/2019  . Left bundle branch block 02/22/2019  . Pneumonia 10/22/2011  . Suicidal ideations 02/2019  . Vitamin D deficiency 01/2019     Past Surgical History:  Procedure Laterality Date  . ARM SURGERY      Family History  Problem Relation Age of Onset  . Hypertension Mother 65  . Cancer Mother        bladder cancer  . Alcohol abuse Father   . Hypertension Brother     Social History   Tobacco Use  . Smoking status: Never Smoker  . Smokeless tobacco: Never Used  Substance Use Topics  . Alcohol use: No    Alcohol/week: 0.0 standard drinks  . Drug use: No    ROS   Objective:   Vitals: BP 126/85 (BP Location: Left Arm)   Pulse (!) 108   Temp 98.1 F (36.7 C) (Oral)   Resp 18   SpO2 98%   Physical Exam Constitutional:      General: He is not in acute distress.    Appearance: Normal appearance. He is well-developed. He is not ill-appearing, toxic-appearing or diaphoretic.  HENT:     Head: Normocephalic and atraumatic.     Right Ear: External ear normal.     Left Ear: External ear normal.     Nose: Nose normal.     Mouth/Throat:     Mouth: Mucous membranes are moist.     Pharynx: Oropharynx is clear.  Eyes:     General: No scleral icterus.    Extraocular Movements: Extraocular movements intact.     Pupils: Pupils are equal, round, and reactive to light.  Cardiovascular:     Rate and Rhythm: Regular rhythm.  Tachycardia present.     Heart sounds: Normal heart sounds. No murmur. No friction rub. No gallop.      Comments: Pulse was 103 bpm.  Pulmonary:     Effort: Pulmonary effort is normal. No respiratory distress.     Breath sounds: Normal breath sounds. No stridor. No wheezing, rhonchi or rales.  Musculoskeletal:     Cervical back: Spasms (right paraspinal muscles) and tenderness (over right mid neck with associated spasms) present. No swelling, edema, deformity, erythema, signs of trauma, lacerations, rigidity, torticollis, bony tenderness or crepitus. Pain with movement present. Normal range of motion.  Skin:    General: Skin is warm and dry.  Neurological:     Mental Status: He is alert and  oriented to person, place, and time.     Deep Tendon Reflexes: Reflexes normal.  Psychiatric:        Mood and Affect: Mood normal.        Behavior: Behavior normal.        Thought Content: Thought content normal.      Assessment and Plan :   1. Neck pain   2. Muscle spasms of neck   3. Uncontrolled type 2 diabetes mellitus with hyperglycemia ([place])   4. Tachycardia     Called pharmacy to cancel naproxen. Patient admits that he does not hydrate with water. He drinks juice almost exclusively. He is non-compliant with his chronic conditions. Emphasized need for lifestyle changes. Advised that he is not to use NSAIDs. Will have him schedule APAP, Flexeril, hydrate much better. Strict ER precautions. Will hold off on x-ray given lack of trauma and bony tenderness. Otherwise, f/u with PCP. Counseled patient on potential for adverse effects with medications prescribed today, patient verbalized understanding.    Jaynee Eagles, [place] 06/18/19 [phone]

## 2019-06-18 NOTE — Telephone Encounter (Signed)
-----   Message -----  From: Graceann Congress, RN  Sent: 06/18/2019  9:29 AM EST  To: [facility] Admin Pool  Subject: FW: Appointment Request                 ----- Message -----  From: [name]  Sent: 06/18/2019  8:53 AM EST  To: Mychart Admin  Subject: Appointment Request                 Appointment Request From: [name]    With Provider: [name], [place]]    Preferred Date Range: Any date 06/18/2019 or later    Preferred Times: Friday Afternoon    Reason for visit: Office Visit    Comments:  Having pain in my neck    I called [name] back:  Patient C/o a constant level 10 pain on the right-side of the neck. With movement  the pain moves to his forehead. (No headache). The pain started 2 days ago.  No relief with tylenol or hot compresses. The pain keeping him from getting a goodnight's sleep.

## 2019-06-18 NOTE — Discharge Instructions (Addendum)
You may take 500mg -650mg  Tylenol every 6 hours for neck pain.    For your diabetes, please make sure you are avoiding starchy, carbohydrate foods like pasta, breads, pastry, rice, potatoes, desserts. These foods can elevated your blood sugar. Also, limit your alcohol drinking to 1 per day, avoid sodas, sweet teas. For elevated blood pressure, make sure you are monitoring salt in your diet.  Do not eat restaurant foods and limit processed foods at home.  Processed foods include things like frozen meals preseason meats and dinners.  Make sure your pain attention to sodium labels on foods you by at the grocery store.  For seasoning you can use a brand called Mrs. Dash which includes a lot of salt free seasonings.  Salads - kale, spinach, cabbage, spring mix; use seeds like pumpkin seeds or sunflower seeds, almonds; you can also use 1-2 hard boiled eggs in your salads Fruits - avocadoes, berries (blueberries, raspberries, blackberries), apples, oranges Vegetables - aspargus, cauliflower, broccoli, green beans, brussel spouts, bell peppers; stay away from starchy vegetables like potatoes, carrots, peas  Regarding meat it is better to eat lean meats and limit your red meat consumption including pork.  Wild caught fish, chicken breast are good options.  Do not eat any foods on this list that you are allergic to.

## 2019-06-18 NOTE — ED Triage Notes (Addendum)
Pt reports right posterior upper neck pain x3 days.  No injury reported.  Pt tested positive for Covid on May 28, 2019.  Pt had another test done June 10, 2019 and it came back negative.

## 2019-06-29 ENCOUNTER — Ambulatory Visit: Payer: Self-pay | Admitting: Family Medicine

## 2019-07-06 MED FILL — ?HUMALOG 100 UNITS/ML VIAL: 100 | 33 days supply | Qty: 10 | Fill #2

## 2019-07-06 MED FILL — !LEVEMIR 100 UNITS/ML VIAL: 100/ML | 33 days supply | Qty: 10 | Fill #1

## 2019-07-09 ENCOUNTER — Encounter: Payer: Self-pay | Admitting: Family Medicine

## 2019-07-09 ENCOUNTER — Ambulatory Visit (INDEPENDENT_AMBULATORY_CARE_PROVIDER_SITE_OTHER): Payer: Self-pay | Admitting: Family Medicine

## 2019-07-09 ENCOUNTER — Other Ambulatory Visit: Payer: Self-pay

## 2019-07-09 VITALS — BP 140/90 | Temp 98.6°F | Ht 67.0 in | Wt 144.4 lb

## 2019-07-09 DIAGNOSIS — F419 Anxiety disorder, unspecified: Secondary | ICD-10-CM

## 2019-07-09 DIAGNOSIS — Z9119 Patient's noncompliance with other medical treatment and regimen: Secondary | ICD-10-CM

## 2019-07-09 DIAGNOSIS — Z09 Encounter for follow-up examination after completed treatment for conditions other than malignant neoplasm: Secondary | ICD-10-CM

## 2019-07-09 DIAGNOSIS — I10 Essential (primary) hypertension: Secondary | ICD-10-CM

## 2019-07-09 DIAGNOSIS — R8290 Unspecified abnormal findings in urine: Secondary | ICD-10-CM

## 2019-07-09 DIAGNOSIS — R7309 Other abnormal glucose: Secondary | ICD-10-CM

## 2019-07-09 DIAGNOSIS — IMO0002 Reserved for concepts with insufficient information to code with codable children: Secondary | ICD-10-CM

## 2019-07-09 DIAGNOSIS — E1142 Type 2 diabetes mellitus with diabetic polyneuropathy: Secondary | ICD-10-CM

## 2019-07-09 DIAGNOSIS — Z0000 Encounter for general adult medical examination without abnormal findings: Secondary | ICD-10-CM

## 2019-07-09 DIAGNOSIS — R7989 Other specified abnormal findings of blood chemistry: Secondary | ICD-10-CM

## 2019-07-09 DIAGNOSIS — Z91199 Patient's noncompliance with other medical treatment and regimen due to unspecified reason: Secondary | ICD-10-CM

## 2019-07-09 DIAGNOSIS — Z8616 Personal history of COVID-19: Secondary | ICD-10-CM

## 2019-07-09 DIAGNOSIS — E1165 Type 2 diabetes mellitus with hyperglycemia: Secondary | ICD-10-CM

## 2019-07-09 LAB — POCT URINALYSIS DIPSTICK
Bilirubin, UA: NEGATIVE
Glucose, UA: POSITIVE — AB
Ketones, UA: NEGATIVE
Leukocytes, UA: NEGATIVE
Nitrite, UA: NEGATIVE
Protein, UA: POSITIVE — AB
Spec Grav, UA: 1.015 (ref 1.010–1.025)
Urobilinogen, UA: 0.2 E.U./dL
pH, UA: 6 (ref 5.0–8.0)

## 2019-07-09 LAB — GLUCOSE, POCT (MANUAL RESULT ENTRY): POC Glucose: 521 mg/dl — AB (ref 70–99)

## 2019-07-09 MED ORDER — CLONIDINE HCL 0.1 MG PO TABS
0.1000 mg | ORAL_TABLET | Freq: Once | ORAL | Status: AC
  Administered 2019-07-09: 15:00:00 0.1 mg via ORAL

## 2019-07-09 NOTE — Progress Notes (Signed)
Patient [facility]    Established Patient Office Visit  Subjective:  Patient ID: [name], male    DOB: May 26, 1970  Age: 49 y.o. MRN: [id number]  CC:  Chief Complaint  Patient presents with  . Follow-up    DM    HPI [name] is a 49 year old male who presents for Follow Up today.   Past Medical History:  Diagnosis Date  . Anxiety 02/2019  . Diabetes mellitus type II, uncontrolled ([place]) 07/17/2006  . Elevated alkaline phosphatase level 01/2019  . ESSENTIAL HYPERTENSION 07/17/2006  . HYPERLIPIDEMIA 11/24/2008  . Insomnia 02/2019  . Left bundle branch block 02/22/2019  . Pneumonia 10/22/2011  . Suicidal ideations 02/2019  . Vitamin D deficiency 01/2019   Current Status: Since he last office visit, he is doing well with no complaints. He states that he has not been taking any of his medications X a few months now, r/t multiple factors. He was recently diagnosed positive for Coronavirus Infection. He denies fevers, chills, fatigue, recent infections, weight loss, and night sweats. He has not had any headaches, visual changes, dizziness, and falls. No chest pain, heart palpitations, cough and shortness of breath reported. No reports of GI problems such as nausea, vomiting, diarrhea, and constipation. He has no reports of blood in stools, dysuria and hematuria. His anxiety is moderated today, r/t health status, personal and family stressors. He denies suicidal ideations, homicidal ideations, or auditory hallucinations. He denies pain today.   Past Surgical History:  Procedure Laterality Date  . ARM SURGERY      Family History  Problem Relation Age of Onset  . Hypertension Mother 28  . Cancer Mother        bladder cancer  . Alcohol abuse Father   . Hypertension Brother     Social History   Socioeconomic History  . Marital status: Legally Separated    Spouse name: Not on file  . Number of children: Not on file  . Years  of education: Not on file  . Highest education level: Not on file  Occupational History  . 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 Occupation: Biochemist, clinical    Comment: Updated June 2013  . Occupation: Engineer, civil (consulting)    Comment: Began Jan 2014  Tobacco Use  . Smoking status: Never Smoker  . Smokeless tobacco: Never Used  Substance and Sexual Activity  . Alcohol use: No    Alcohol/week: 0.0 standard drinks  . Drug use: No  . Sexual activity: Yes    Partners: Female  Other Topics Concern  . Not on file  Social History Narrative   Lives with his mother and brother.   Wife has had a foot amputation; is a type 1 DM.   Social Determinants of Health   Financial Resource Strain:   . Difficulty of Paying Living Expenses: Not on file  Food Insecurity:   . Worried About Charity fundraiser in the Last Year: Not on file  . Ran Out of Food in the Last Year: Not on file  Transportation Needs:   . Lack of Transportation (Medical): Not on file  . Lack of Transportation (Non-Medical): Not on file  Physical Activity:   . Days of Exercise per Week: Not on file  . Minutes of Exercise per Session: Not on file  Stress:   . Feeling of Stress : Not on file  Social Connections:   . Frequency  of Communication with Friends and Family: Not on file  . Frequency of Social Gatherings with Friends and Family: Not on file  . Attends Religious Services: Not on file  . Active Member of Clubs or Organizations: Not on file  . Attends Archivist Meetings: Not on file  . Marital Status: Not on file  Intimate Partner Violence:   . Fear of Current or Ex-Partner: Not on file  . Emotionally Abused: Not on file  . Physically Abused: Not on file  . Sexually Abused: Not on file    Outpatient Medications Prior to Visit  Medication Sig Dispense Refill  . atorvastatin (LIPITOR) 20 MG tablet Take 1 tablet (20 mg total) by mouth daily. (Patient not taking: Reported on 07/09/2019) 90 tablet 3  .  Blood Glucose Monitoring Suppl (TRUE METRIX METER) w/Device KIT Use as instructed (Patient not taking: Reported on 07/09/2019) 1 kit 0  . carvedilol (COREG) 6.25 MG tablet Take 1 tablet (6.25 mg total) by mouth 2 (two) times daily. (Patient not taking: Reported on 07/09/2019) 180 tablet 3  . cyclobenzaprine (FLEXERIL) 5 MG tablet Take 1 tablet (5 mg total) by mouth 2 (two) times daily as needed for muscle spasms. (Patient not taking: Reported on 07/09/2019) 60 tablet 0  . gabapentin (NEURONTIN) 300 MG capsule Take 1 capsule (300 mg total) by mouth 3 (three) times daily. (Patient not taking: Reported on 07/09/2019) 90 capsule 3  . glucose blood test strip Use to check fasting blood sugar once daily. 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 Insulin dependent (Patient not taking: Reported on 07/09/2019) 100 each 12  . insulin detemir (LEVEMIR) 100 UNIT/ML injection Inject 0.3 mLs (30 Units total) into the skin daily. (Patient not taking: Reported on 07/09/2019) 10 mL 11  . insulin lispro (HUMALOG) 100 UNIT/ML injection Inject 20 units under skin, 2 times a day. (Patient not taking: Reported on 07/09/2019) 10 mL 3  . Insulin Pen Needle (B-D UF III MINI PEN NEEDLES) 31G X 5 MM MISC Use as instructed (Patient not taking: Reported on 07/09/2019) 90 each 1  . Insulin Syringes, Disposable, U-100 0.3 ML MISC 100 Syringes by Does not apply route 3 (three) times daily with meals as needed. (Patient not taking: Reported on 07/09/2019) 100 each 6  . Lancets (ACCU-CHEK SOFT TOUCH) lancets Use as instructed (Patient not taking: Reported on 07/09/2019) 100 each 12  . lisinopril (ZESTRIL) 20 MG tablet Take 1 tablet (20 mg total) by mouth daily. (Patient not taking: Reported on 07/09/2019) 90 tablet 3  . metFORMIN (GLUCOPHAGE) 1000 MG tablet Take 1 tablet (1,000 mg total) by mouth 2 (two) times daily with a meal. (Patient not taking: Reported on 07/09/2019) 180 tablet 3  . metoprolol tartrate (LOPRESSOR) 100 MG tablet Take 2 hours prior to procedure  (Patient not taking: Reported on 06/05/2019) 1 tablet 0  . sildenafil (VIAGRA) 25 MG tablet Take 1 tablet (25 mg total) by mouth as needed for erectile dysfunction. for erectile dysfunction. (Patient not taking: Reported on 07/09/2019) 30 tablet 3  . triamcinolone cream (KENALOG) 0.1 % Apply 1 application topically 2 (two) times daily. (Patient not taking: Reported on 07/09/2019) 30 g 3  . TRUEplus Lancets 28G MISC Use as instructed (Patient not taking: Reported on 07/09/2019) 100 each 3  . Vitamin D, Ergocalciferol, (DRISDOL) 1.25 MG (50000 UT) CAPS capsule Take 1 capsule (50,000 Units total) by mouth every 7 (seven) days. (Patient not taking: Reported on 07/09/2019) 5 capsule 6  . ALPRAZolam (XANAX) 0.5 MG tablet  Take 1 tablet (0.5 mg total) by mouth at bedtime as needed for anxiety. 30 tablet 0  . benzonatate (TESSALON) 100 MG capsule Take 1 capsule (100 mg total) by mouth 2 (two) times daily as needed for cough. 20 capsule 0  . benzonatate (TESSALON) 100 MG capsule Take 1 capsule (100 mg total) by mouth every 8 (eight) hours. 21 capsule 0   No facility-administered medications prior to visit.    Allergies  Allergen Reactions  . Sulfa Antibiotics Rash    Severe rash. Do not give sulfa medications  . Bactrim [Sulfamethoxazole-Trimethoprim]     Rash  . Cephalexin     Patient with severe drug reaction including exfoliating skin rash and hypotension after being prescribed both cephalexin and sulfamethoxazole-trimethoprim simultaneously. Favor SMX as much more likely culprit as patient had tolerated other cephalosporins in the past, but cannot say with complete certainty that this was not related to cephalexin.   . Other     Patient states he is allergic to the TB test    ROS Review of Systems  Constitutional: Negative.   HENT: Negative.   Eyes: Negative.   Respiratory: Negative.   Cardiovascular: Negative.   Gastrointestinal: Negative.   Endocrine: Negative.   Genitourinary: Negative.    Musculoskeletal: Negative.   Skin: Negative.   Allergic/Immunologic: Negative.   Neurological: Positive for dizziness (occasional) and headaches (occasional ).  Hematological: Negative.   Psychiatric/Behavioral: Negative.     Objective:    Physical Exam  Constitutional: He is oriented to person, place, and time. He appears well-developed and well-nourished.  HENT:  Head: Normocephalic and atraumatic.  Eyes: Conjunctivae are normal.  Cardiovascular: Normal rate, regular rhythm, normal heart sounds and intact distal pulses.  Pulmonary/Chest: Effort normal and breath sounds normal.  Abdominal: Soft. Bowel sounds are normal.  Musculoskeletal:        General: Normal range of motion.     Cervical back: Normal range of motion and neck supple.  Neurological: He is alert and oriented to person, place, and time. He has normal reflexes.  Skin: Skin is warm and dry.  Psychiatric: He has a normal mood and affect. His behavior is normal. Judgment and thought content normal.  Nursing note and vitals reviewed.   BP 140/90   Temp 98.6 F (37 C) (Oral)   Ht 5' 7"  (1.702 m)   Wt 144 lb 6.4 oz (65.5 kg)   SpO2 100%   BMI 22.62 kg/m  Wt Readings from Last 3 Encounters:  07/09/19 144 lb 6.4 oz (65.5 kg)  06/05/19 145 lb 1 oz (65.8 kg)  04/27/19 145 lb (65.8 kg)     Health Maintenance Due  Topic Date Due  . OPHTHALMOLOGY EXAM  03/19/1983  . FOOT EXAM  08/20/2017  . LIPID PANEL  06/10/2018  . HEMOGLOBIN A1C  07/09/2019    There are no preventive care reminders to display for this patient.  Lab Results  Component Value Date   TSH 2.470 01/22/2019   Lab Results  Component Value Date   WBC 6.8 06/05/2019   HGB 10.6 (L) 06/05/2019   HCT 32.1 (L) 06/05/2019   MCV 82.9 06/05/2019   PLT 412 (H) 06/05/2019   Lab Results  Component Value Date   NA 130 (L) 06/05/2019   K 5.6 (H) 06/05/2019   CO2 28 06/05/2019   GLUCOSE 484 (H) 06/05/2019   BUN 29 (H) 06/05/2019   CREATININE 2.05  (H) 06/05/2019   BILITOT 1.0 06/05/2019   ALKPHOS 105  06/05/2019   AST 50 (H) 06/05/2019   ALT 25 06/05/2019   PROT RESULTS UNAVAILABLE DUE TO INTERFERING SUBSTANCE 06/05/2019   ALBUMIN 2.1 (L) 06/05/2019   CALCIUM 8.1 (L) 06/05/2019   ANIONGAP 11 06/05/2019   Lab Results  Component Value Date   CHOL 159 06/10/2017   Lab Results  Component Value Date   HDL 36 (L) 06/10/2017   Lab Results  Component Value Date   LDLCALC 88 06/10/2017   Lab Results  Component Value Date   TRIG 177 (H) 06/10/2017   Lab Results  Component Value Date   CHOLHDL 4.4 06/10/2017   Lab Results  Component Value Date   HGBA1C 13.3 (H) 04/08/2019   Assessment & Plan:   1. Hospital discharge follow-up  2. History of 2019 novel coronavirus disease (COVID-19) Diagnosed on 05/28/2019. Stable today. No signs or symptoms of recurrence noted or reported  3. Uncontrolled type 2 diabetes mellitus with peripheral neuropathy ([place])  4. Hemoglobin A1C greater than 9%, indicating poor diabetic control Unable to obtain A1c reading today.   5. Elevated glucose  6. History of noncompliance with medical treatment He will re-start taking all medications as prescribed. Medication refills are at pharmacy.   7. Essential hypertension Stabilized today. He will report to ED if he eperiences severe headaches, confusion, seizures, double vision, and blurred vision, nausea and vomiting.  8. Elevated serum creatinine - Ambulatory referral to Nephrology  9. Anxiety  10. Abnormal urinalysis Results are pending.  - Urine Culture  11. Healthcare maintenance - POCT urinalysis dipstick - POCT glucose (manual entry) - cloNIDine (CATAPRES) tablet 0.1 mg  12. Follow up He will follow up in 1 month. Patient will return to office asap to re-evaluate blood glucose level.   Meds ordered this encounter  Medications  . cloNIDine (CATAPRES) tablet 0.1 mg    Orders Placed This Encounter  Procedures  . Urine Culture   . Ambulatory referral to Nephrology  . POCT urinalysis dipstick  . POCT glucose (manual entry)     Referral Orders     Ambulatory referral to Nephrology  Kathe Becton,  MSN, FNP-BC [facility] [address] [place], [place] [address] [phone] fax  Problem List Items Addressed This Visit      Cardiovascular and Mediastinum   Essential hypertension     Endocrine   Uncontrolled type 2 diabetes mellitus with peripheral neuropathy ([place])     Other   Elevated glucose   Healthcare maintenance   Relevant Orders   POCT urinalysis dipstick (Completed)   POCT glucose (manual entry) (Completed)    Other Visit Diagnoses    Hospital discharge follow-up    -  Primary   History of 2019 novel coronavirus disease (COVID-19)       Hemoglobin A1C greater than 9%, indicating poor diabetic control       History of noncompliance with medical treatment       Elevated serum creatinine       Relevant Orders   Ambulatory referral to Nephrology   Anxiety       Abnormal urinalysis       Relevant Orders   Urine Culture   Follow up          Meds ordered this encounter  Medications  . cloNIDine (CATAPRES) tablet 0.1 mg    Follow-up: Return in about 1 month (around 08/06/2019).    [name], FNP

## 2019-07-13 ENCOUNTER — Telehealth: Payer: Self-pay | Admitting: Family Medicine

## 2019-07-13 NOTE — Telephone Encounter (Signed)
Multiple attempts to contact patient concerning need to follow up for Office Visit to assess blood glucose levels. Messages left, and also left messages with patient's mother.

## 2019-07-14 ENCOUNTER — Telehealth: Payer: Self-pay | Admitting: Family Medicine

## 2019-07-14 ENCOUNTER — Telehealth: Payer: Self-pay

## 2019-07-14 NOTE — Telephone Encounter (Signed)
Staff has made multiple attempts to contact patient for re-assessment of blood glucose level.   RE: "Lab Only Appointment" Received: Today Message Contents  Ophelia Shoulder, NT  [name], FNP  I have called several times too contact this pt and he is not answering the phone yesterday and today       Previous Messages   ----- Message -----  From: [name], FNP  Sent: 07/14/2019 10:52 AM EST  To: Ophelia Shoulder, NT, Jorja Loa, RMA  Subject: FW: "Lab Only Appointment"            Patient needs to come to office 'asap' for a Lab Only appointment to reassess blood glucose level as mentioned below. He will need a CBG and A1c at that time. Hopefully, patient has re-started taking Diabetic medications. Dee, please schedule patient for Lab Only for Friday, 07/16/2019, anytime before 11am, because he may have to receive IVFs. I will also see him in the office at his scheduled appointment on 08/06/2019. I have attempted to contact him several time within the past few days, left messages, and spoke with his mother. He is not answering his phone, so please try his mother,Tracie. Please and thank you both for your help with this!   ----- Message -----  From: Jorja Loa, RMA  Sent: 07/14/2019  8:59 AM EST  To: [name], FNP  Subject: RE: "Lab Only Appointment"            I spoke with Mrs. Wymer she was advised of Jacques's need for follow up. She even spoke with him in the background regarding follow up. ASAP. Office hours given.  ----- Message -----  From: [name], FNP  Sent: 07/13/2019  8:11 AM EST  To: Ophelia Shoulder, NT, Jorja Loa, RMA, *  Subject: "Lab Only Appointment"              Several messages left for patient to come into office for Lab Only appointment to re-assess blood glucose level. If patient calls office, please schedule him for Lab Only for today (or asap). Robbin please get POCT Hgb A1c and CBG at  that time. Please and thank you!

## 2019-07-14 NOTE — Telephone Encounter (Signed)
done

## 2019-07-15 ENCOUNTER — Telehealth: Payer: Self-pay | Admitting: Family Medicine

## 2019-07-15 NOTE — Telephone Encounter (Signed)
Pt was called and reminded of there appointment A MESSAGE WAS LEFT

## 2019-07-16 ENCOUNTER — Telehealth: Payer: Self-pay | Admitting: Family Medicine

## 2019-07-19 NOTE — Telephone Encounter (Signed)
error 

## 2019-07-27 ENCOUNTER — Ambulatory Visit (INDEPENDENT_AMBULATORY_CARE_PROVIDER_SITE_OTHER): Payer: Self-pay | Admitting: Cardiovascular Disease

## 2019-07-27 ENCOUNTER — Encounter: Payer: Self-pay | Admitting: Cardiovascular Disease

## 2019-07-27 ENCOUNTER — Other Ambulatory Visit: Payer: Self-pay

## 2019-07-27 DIAGNOSIS — I519 Heart disease, unspecified: Secondary | ICD-10-CM

## 2019-07-27 DIAGNOSIS — E785 Hyperlipidemia, unspecified: Secondary | ICD-10-CM

## 2019-07-27 LAB — LIPID PANEL
Chol/HDL Ratio: 4 ratio (ref 0.0–5.0)
Cholesterol, Total: 176 mg/dL (ref 100–199)
HDL: 44 mg/dL (ref >39)
LDL Chol Calc (NIH): 114 mg/dL — ABNORMAL HIGH (ref 0–99)
Triglycerides: 100 mg/dL (ref 0–149)
VLDL Cholesterol Cal: 18 mg/dL (ref 5–40)

## 2019-07-27 LAB — HEPATIC FUNCTION PANEL
ALT: 7 IU/L (ref 0–44)
AST: 19 IU/L (ref 0–40)
Albumin: 3.2 g/dL — ABNORMAL LOW (ref 4.0–5.0)
Alkaline Phosphatase: 115 IU/L (ref 39–117)
Bilirubin Total: <0.2 mg/dL (ref 0.0–1.2)
Bilirubin, Direct: 0.09 mg/dL (ref 0.00–0.40)
Total Protein: 6.1 g/dL (ref 6.0–8.5)

## 2019-07-27 MED ORDER — AMLODIPINE BESYLATE 5 MG PO TABS: 5.0000 mg | ORAL_TABLET | Freq: Every day | ORAL | 3 refills | Status: DC

## 2019-07-27 MED FILL — AMLODIPINE BESYLATE 5 MG TA: 5 | 30 days supply | Qty: 30 | Fill #0

## 2019-07-27 NOTE — Progress Notes (Signed)
07/27/2019 [name]   [birth year]  [id number]  Primary Physician [name], FNP Primary Cardiologist: Lorretta Harp MD Lupe Carney, [place]  HPI:  [name] is a 49 y.o.   thin appearing married African-American male father of 2 living children (1 deceased) with no grandchildren who is currently currently works as a Scientist, water quality at [employer].He was referred by Kathe Becton, FNP for evaluation of dyspnea on exertion. I last saw him in the office 04/27/2019.  He has a history of treated hypertension, hyperlipidemia and diabetes. There is no family history for heart disease. Is never had a heart attack or stroke.   He does have chronic left bundle branch block. He gets dyspneic especially while having sexual intercourse.  He was admitted to the hospital for hypertensive urgency 04/07/2019 after a disagreement with his fiancs Jocelyn.  His enzymes were fairly low.  He did have a coronary calcium score which was 0 suggesting his chest pain was noncardiac.  He admits to diet dietary discretion with regards to salt and medication noncompliance.  A 2D echo performed 03/19/2019 revealed an EF of 40 to 45%, significantly reduced compared to the previous echo performed 01/02/2016 which showed a normal EF.  Since I saw him in the office 3 months ago he did develop COVID-19 in mid January and was quarantined for 2 weeks.  He feels clinically improved from this.  He denies chest pain or shortness of breath.  He has been taking his antihypertensive medications and has avoided salt.   Current Meds  Medication Sig  . glucose blood test strip Use to check fasting blood sugar once daily. 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 Insulin dependent  . insulin detemir (LEVEMIR) 100 UNIT/ML injection Inject 0.3 mLs (30 Units total) into the skin daily.  . insulin lispro (HUMALOG) 100 UNIT/ML injection Inject 20 units under skin, 2 times a day.  . Insulin Pen Needle (B-D UF III MINI PEN NEEDLES) 31G X 5 MM  MISC Use as instructed  . Insulin Syringes, Disposable, U-100 0.3 ML MISC 100 Syringes by Does not apply route 3 (three) times daily with meals as needed.  . Lancets (ACCU-CHEK SOFT TOUCH) lancets Use as instructed  . lisinopril (ZESTRIL) 20 MG tablet Take 1 tablet (20 mg total) by mouth daily.  . metFORMIN (GLUCOPHAGE) 1000 MG tablet Take 1 tablet (1,000 mg total) by mouth 2 (two) times daily with a meal.  . metoprolol tartrate (LOPRESSOR) 100 MG tablet Take 2 hours prior to procedure  . sildenafil (VIAGRA) 25 MG tablet Take 1 tablet (25 mg total) by mouth as needed for erectile dysfunction. for erectile dysfunction.  . triamcinolone cream (KENALOG) 0.1 % Apply 1 application topically 2 (two) times daily.  . TRUEplus Lancets 28G MISC Use as instructed  . Vitamin D, Ergocalciferol, (DRISDOL) 1.25 MG (50000 UT) CAPS capsule Take 1 capsule (50,000 Units total) by mouth every 7 (seven) days.     Allergies  Allergen Reactions  . Sulfa Antibiotics Rash    Severe rash. Do not give sulfa medications  . Bactrim [Sulfamethoxazole-Trimethoprim]     Rash  . Cephalexin     Patient with severe drug reaction including exfoliating skin rash and hypotension after being prescribed both cephalexin and sulfamethoxazole-trimethoprim simultaneously. Favor SMX as much more likely culprit as patient had tolerated other cephalosporins in the past, but cannot say with complete certainty that this was not related to cephalexin.   . Other     Patient  states he is allergic to the TB test    Social History   Socioeconomic History  . Marital status: Legally Separated    Spouse name: Not on file  . Number of children: Not on file  . Years of education: Not on file  . Highest education level: Not on file  Occupational History  . 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 Occupation: Biochemist, clinical    Comment: Updated June 2013  . Occupation: Engineer, civil (consulting)    Comment: Began Jan 2014  Tobacco Use  . Smoking  status: Never Smoker  . Smokeless tobacco: Never Used  Substance and Sexual Activity  . Alcohol use: No    Alcohol/week: 0.0 standard drinks  . Drug use: No  . Sexual activity: Yes    Partners: Female  Other Topics Concern  . Not on file  Social History Narrative   Lives with his mother and brother.   Wife has had a foot amputation; is a type 1 DM.   Social Determinants of Health   Financial Resource Strain:   . Difficulty of Paying Living Expenses: Not on file  Food Insecurity:   . Worried About Charity fundraiser in the Last Year: Not on file  . Ran Out of Food in the Last Year: Not on file  Transportation Needs:   . Lack of Transportation (Medical): Not on file  . Lack of Transportation (Non-Medical): Not on file  Physical Activity:   . Days of Exercise per Week: Not on file  . Minutes of Exercise per Session: Not on file  Stress:   . Feeling of Stress : Not on file  Social Connections:   . Frequency of Communication with Friends and Family: Not on file  . Frequency of Social Gatherings with Friends and Family: Not on file  . Attends Religious Services: Not on file  . Active Member of Clubs or Organizations: Not on file  . Attends Archivist Meetings: Not on file  . Marital Status: Not on file  Intimate Partner Violence:   . Fear of Current or Ex-Partner: Not on file  . Emotionally Abused: Not on file  . Physically Abused: Not on file  . Sexually Abused: Not on file     Review of Systems: General: negative for chills, fever, night sweats or weight changes.  Cardiovascular: negative for chest pain, dyspnea on exertion, edema, orthopnea, palpitations, paroxysmal nocturnal dyspnea or shortness of breath Dermatological: negative for rash Respiratory: negative for cough or wheezing Urologic: negative for hematuria Abdominal: negative for nausea, vomiting, diarrhea, bright red blood per rectum, melena, or hematemesis Neurologic: negative for visual changes,  syncope, or dizziness All other systems reviewed and are otherwise negative except as noted above.    Blood pressure (!) 148/90, pulse 95, temperature 97.7 F (36.5 C), resp. rate (!) 21, height 5\' 7"  (1.702 m), weight 147 lb 3.2 oz (66.8 kg), SpO2 97 %.  General appearance: alert and no distress Neck: no adenopathy, no carotid bruit, no JVD, supple, symmetrical, trachea midline and thyroid not enlarged, symmetric, no tenderness/mass/nodules Lungs: clear to auscultation bilaterally Heart: regular rate and rhythm, S1, S2 normal, no murmur, click, rub or gallop Extremities: extremities normal, atraumatic, no cyanosis or edema Pulses: 2+ and symmetric Skin: Skin color, texture, turgor normal. No rashes or lesions Neurologic: Alert and oriented X 3, normal strength and tone. Normal symmetric reflexes. Normal coordination and gait  EKG Not performed today  ASSESSMENT AND PLAN:  Hyperlipidemia LDL goal <70 History of hyperlipidemia on statin therapy with lipid profile performed 06/10/2017 revealing a total cholesterol 159, LDL of 88 HDL of 36.  Left ventricular dysfunction History of left ventricular dysfunction with an EF by echo 03/19/2019 of 40 to 45%.  I suspect that this is related to hypertensive heart disease.  He is aware of salt restriction.  He denies symptoms of heart failure.      Lorretta Harp MD FACP,FACC,FAHA, [facility] 07/27/2019 10:04 AM

## 2019-07-27 NOTE — Assessment & Plan Note (Signed)
History of hyperlipidemia on statin therapy with lipid profile performed 06/10/2017 revealing a total cholesterol 159, LDL of 88 HDL of 36.

## 2019-07-27 NOTE — Assessment & Plan Note (Signed)
History of left ventricular dysfunction with an EF by echo 03/19/2019 of 40 to 45%.  I suspect that this is related to hypertensive heart disease.  He is aware of salt restriction.  He denies symptoms of heart failure.

## 2019-07-27 NOTE — Patient Instructions (Signed)
Medication Instructions:  Start taking 5mg  Amlodipine Daily.  If you need a refill on your cardiac medications before your next appointment, please call your pharmacy.   Lab work: Lipids and Hepatic Function  Testing/Procedures: NONE  Follow-Up: At [facility], you and your health needs are our priority.  As part of our continuing mission to provide you with exceptional heart care, we have created designated Provider Care Teams.  These Care Teams include your primary Cardiologist (physician) and Advanced Practice Providers (APPs -  Physician Assistants and Nurse Practitioners) who all work together to provide you with the care you need, when you need it. You may see Dr. Gwenlyn Found or one of the following Advanced Practice Providers on your designated Care Team:    Kerin Ransom, PA-C  [place], [place]  Coletta Memos, [place]  Your physician wants you to follow-up in: 4-6 weeks with Pharmacy to discuss blood pressures.  Your physician wants you to follow-up in: 6 months with Dr. Gwenlyn Found. You will receive a reminder letter in the mail two months in advance. If you don't receive a letter, please call our office to schedule the follow-up appointment.

## 2019-08-06 ENCOUNTER — Encounter: Payer: Self-pay | Admitting: Family Medicine

## 2019-08-06 ENCOUNTER — Ambulatory Visit: Payer: Self-pay | Admitting: Family Medicine

## 2019-08-06 ENCOUNTER — Ambulatory Visit (INDEPENDENT_AMBULATORY_CARE_PROVIDER_SITE_OTHER): Payer: Self-pay | Admitting: Family Medicine

## 2019-08-06 ENCOUNTER — Other Ambulatory Visit: Payer: Self-pay

## 2019-08-06 VITALS — BP 154/90 | HR 97 | Temp 98.6°F | Ht 67.0 in | Wt 146.8 lb

## 2019-08-06 DIAGNOSIS — Z9119 Patient's noncompliance with other medical treatment and regimen: Secondary | ICD-10-CM

## 2019-08-06 DIAGNOSIS — R7309 Other abnormal glucose: Secondary | ICD-10-CM

## 2019-08-06 DIAGNOSIS — E1165 Type 2 diabetes mellitus with hyperglycemia: Secondary | ICD-10-CM

## 2019-08-06 DIAGNOSIS — IMO0002 Reserved for concepts with insufficient information to code with codable children: Secondary | ICD-10-CM

## 2019-08-06 DIAGNOSIS — E785 Hyperlipidemia, unspecified: Secondary | ICD-10-CM

## 2019-08-06 DIAGNOSIS — Z91199 Patient's noncompliance with other medical treatment and regimen due to unspecified reason: Secondary | ICD-10-CM | POA: Insufficient documentation

## 2019-08-06 DIAGNOSIS — I10 Essential (primary) hypertension: Secondary | ICD-10-CM

## 2019-08-06 DIAGNOSIS — Z09 Encounter for follow-up examination after completed treatment for conditions other than malignant neoplasm: Secondary | ICD-10-CM

## 2019-08-06 DIAGNOSIS — E1142 Type 2 diabetes mellitus with diabetic polyneuropathy: Secondary | ICD-10-CM

## 2019-08-06 DIAGNOSIS — F419 Anxiety disorder, unspecified: Secondary | ICD-10-CM

## 2019-08-06 LAB — POCT URINALYSIS DIPSTICK
Bilirubin, UA: NEGATIVE
Glucose, UA: POSITIVE — AB
Leukocytes, UA: NEGATIVE
Nitrite, UA: NEGATIVE
Protein, UA: POSITIVE — AB
Spec Grav, UA: 1.025 (ref 1.010–1.025)
Urobilinogen, UA: 1 E.U./dL
pH, UA: 7 (ref 5.0–8.0)

## 2019-08-06 LAB — GLUCOSE, POCT (MANUAL RESULT ENTRY): POC Glucose: 107 mg/dl — AB (ref 70–99)

## 2019-08-06 LAB — POCT GLYCOSYLATED HEMOGLOBIN (HGB A1C): Hemoglobin A1C: 14.2 % — AB (ref 4.0–5.6)

## 2019-08-06 MED ORDER — METFORMIN HCL 1000 MG PO TABS: 1000.0000 mg | ORAL_TABLET | Freq: Two times a day (BID) | ORAL | 3 refills | Status: DC

## 2019-08-06 MED ORDER — INSULIN LISPRO 100 UNIT/ML ~~LOC~~ SOLN: SUBCUTANEOUS | 3 refills | Status: DC

## 2019-08-06 MED ORDER — INSULIN DETEMIR 100 UNIT/ML ~~LOC~~ SOLN: 30.0000 [IU] | Freq: Every day | SUBCUTANEOUS | 11 refills | Status: DC

## 2019-08-06 MED ORDER — ATORVASTATIN CALCIUM 20 MG PO TABS: 20.0000 mg | ORAL_TABLET | Freq: Every day | ORAL | 3 refills | Status: DC

## 2019-08-06 MED FILL — !LEVEMIR 100 UNITS/ML VIAL: 100/ML | 28 days supply | Qty: 10 | Fill #0

## 2019-08-06 MED FILL — ?HUMALOG 100 UNITS/ML VIAL: 100 | 25 days supply | Qty: 10 | Fill #0

## 2019-08-06 MED FILL — ?CARVEDILOL 6.25 MG TABLET: 6.25 | 30 days supply | Qty: 60 | Fill #1

## 2019-08-06 MED FILL — ?ATORVASTATIN 20 MG TABLET: 20 | 30 days supply | Qty: 30 | Fill #0

## 2019-08-06 MED FILL — metFORMIN HCL 1000 MG TABS: 1000 | 30 days supply | Qty: 60 | Fill #1

## 2019-08-06 NOTE — Progress Notes (Signed)
Patient [facility]    Established Patient Office Visit  Subjective:  Patient ID: [name], male    DOB: 11/09/1970  Age: 49 y.o. MRN: [id number]  CC:  Chief Complaint  Patient presents with  . Follow-up    DM    HPI [name] is a 49 year old male who presents for Follow up today.   Past Medical History:  Diagnosis Date  . Anxiety 02/2019  . Diabetes mellitus type II, uncontrolled ([place]) 07/17/2006  . Elevated alkaline phosphatase level 01/2019  . ESSENTIAL HYPERTENSION 07/17/2006  . HYPERLIPIDEMIA 11/24/2008  . Insomnia 02/2019  . Left bundle branch block 02/22/2019  . Pneumonia 10/22/2011  . Suicidal ideations 02/2019  . Vitamin D deficiency 01/2019   Current Status: Since his last office visit, he is doing well with no complaints.  He is accompanied by his girlfriend today. He denies visual changes, chest pain, cough, shortness of breath, heart palpitations, and falls. He has occasional headaches and dizziness with position changes. Denies severe headaches, confusion, seizures, double vision, and blurred vision, nausea and vomiting. He reports visual problems, excessive thirst, and frequent urination. He denies fatigue, weight gain, weight loss, and poor wound healing. He continues to check his feet regularly. He denies fevers, chills, fatigue, recent infections, weight loss, and night sweats. No reports of GI problems such as diarrhea, and constipation. He has no reports of blood in stools, dysuria and hematuria. No depression or anxiety reported today. He denies suicidal ideations, homicidal ideations, or auditory hallucinations. He denies pain today.   Past Surgical History:  Procedure Laterality Date  . ARM SURGERY      Family History  Problem Relation Age of Onset  . Hypertension Mother 73  . Cancer Mother        bladder cancer  . Alcohol abuse Father   . Hypertension Brother     Social History    Socioeconomic History  . Marital status: Legally Separated    Spouse name: Not on file  . Number of children: Not on file  . Years of education: Not on file  . Highest education level: Not on file  Occupational History  . 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 Occupation: Biochemist, clinical    Comment: Updated June 2013  . Occupation: Engineer, civil (consulting)    Comment: Began Jan 2014  Tobacco Use  . Smoking status: Never Smoker  . Smokeless tobacco: Never Used  Substance and Sexual Activity  . Alcohol use: No    Alcohol/week: 0.0 standard drinks  . Drug use: No  . Sexual activity: Yes    Partners: Female  Other Topics Concern  . Not on file  Social History Narrative   Lives with his mother and brother.   Wife has had a foot amputation; is a type 1 DM.   Social Determinants of Health   Financial Resource Strain:   . Difficulty of Paying Living Expenses:   Food Insecurity:   . Worried About Charity fundraiser in the Last Year:   . Arboriculturist in the Last Year:   Transportation Needs:   . Film/video editor (Medical):   Marland Kitchen Lack of Transportation (Non-Medical):   Physical Activity:   . Days of Exercise per Week:   . Minutes of Exercise per Session:   Stress:   . Feeling of Stress :   Social Connections:   . Frequency of Communication with  Friends and Family:   . Frequency of Social Gatherings with Friends and Family:   . Attends Religious Services:   . Active Member of Clubs or Organizations:   . Attends Archivist Meetings:   Marland Kitchen Marital Status:   Intimate Partner Violence:   . Fear of Current or Ex-Partner:   . Emotionally Abused:   Marland Kitchen Physically Abused:   . Sexually Abused:     Outpatient Medications Prior to Visit  Medication Sig Dispense Refill  . amLODipine (NORVASC) 5 MG tablet Take 1 tablet (5 mg total) by mouth daily. (Patient not taking: Reported on 08/06/2019) 180 tablet 3  . atorvastatin (LIPITOR) 20 MG tablet Take 1 tablet (20 mg total) by  mouth daily. (Patient not taking: Reported on 07/27/2019) 90 tablet 3  . Blood Glucose Monitoring Suppl (TRUE METRIX METER) w/Device KIT Use as instructed (Patient not taking: Reported on 07/27/2019) 1 kit 0  . carvedilol (COREG) 6.25 MG tablet Take 1 tablet (6.25 mg total) by mouth 2 (two) times daily. (Patient not taking: Reported on 07/27/2019) 180 tablet 3  . cyclobenzaprine (FLEXERIL) 5 MG tablet Take 1 tablet (5 mg total) by mouth 2 (two) times daily as needed for muscle spasms. (Patient not taking: Reported on 07/27/2019) 60 tablet 0  . gabapentin (NEURONTIN) 300 MG capsule Take 1 capsule (300 mg total) by mouth 3 (three) times daily. (Patient not taking: Reported on 07/27/2019) 90 capsule 3  . glucose blood test strip Use to check fasting blood sugar once daily. 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 Insulin dependent (Patient not taking: Reported on 08/06/2019) 100 each 12  . insulin detemir (LEVEMIR) 100 UNIT/ML injection Inject 0.3 mLs (30 Units total) into the skin daily. (Patient not taking: Reported on 08/06/2019) 10 mL 11  . insulin lispro (HUMALOG) 100 UNIT/ML injection Inject 20 units under skin, 2 times a day. (Patient not taking: Reported on 08/06/2019) 10 mL 3  . Insulin Pen Needle (B-D UF III MINI PEN NEEDLES) 31G X 5 MM MISC Use as instructed (Patient not taking: Reported on 08/06/2019) 90 each 1  . Insulin Syringes, Disposable, U-100 0.3 ML MISC 100 Syringes by Does not apply route 3 (three) times daily with meals as needed. (Patient not taking: Reported on 08/06/2019) 100 each 6  . Lancets (ACCU-CHEK SOFT TOUCH) lancets Use as instructed (Patient not taking: Reported on 08/06/2019) 100 each 12  . metFORMIN (GLUCOPHAGE) 1000 MG tablet Take 1 tablet (1,000 mg total) by mouth 2 (two) times daily with a meal. (Patient not taking: Reported on 08/06/2019) 180 tablet 3  . metoprolol tartrate (LOPRESSOR) 100 MG tablet Take 2 hours prior to procedure (Patient not taking: Reported on 08/06/2019) 1 tablet 0  . sildenafil  (VIAGRA) 25 MG tablet Take 1 tablet (25 mg total) by mouth as needed for erectile dysfunction. for erectile dysfunction. (Patient not taking: Reported on 08/06/2019) 30 tablet 3  . triamcinolone cream (KENALOG) 0.1 % Apply 1 application topically 2 (two) times daily. (Patient not taking: Reported on 08/06/2019) 30 g 3  . TRUEplus Lancets 28G MISC Use as instructed (Patient not taking: Reported on 08/06/2019) 100 each 3  . Vitamin D, Ergocalciferol, (DRISDOL) 1.25 MG (50000 UT) CAPS capsule Take 1 capsule (50,000 Units total) by mouth every 7 (seven) days. (Patient not taking: Reported on 08/06/2019) 5 capsule 6  . lisinopril (ZESTRIL) 20 MG tablet Take 1 tablet (20 mg total) by mouth daily. (Patient not taking: Reported on 08/06/2019) 90 tablet 3   No facility-administered medications  prior to visit.    Allergies  Allergen Reactions  . Sulfa Antibiotics Rash    Severe rash. Do not give sulfa medications  . Bactrim [Sulfamethoxazole-Trimethoprim]     Rash  . Cephalexin     Patient with severe drug reaction including exfoliating skin rash and hypotension after being prescribed both cephalexin and sulfamethoxazole-trimethoprim simultaneously. Favor SMX as much more likely culprit as patient had tolerated other cephalosporins in the past, but cannot say with complete certainty that this was not related to cephalexin.   . Other     Patient states he is allergic to the TB test    ROS Review of Systems  Constitutional: Negative.   HENT: Negative.   Eyes: Positive for visual disturbance.  Respiratory: Negative.   Cardiovascular: Negative.   Gastrointestinal: Negative.   Endocrine: Positive for polydipsia, polyphagia and polyuria.  Genitourinary: Negative.   Musculoskeletal: Negative.   Skin: Negative.   Allergic/Immunologic: Negative.   Neurological: Positive for dizziness (occasional ) and headaches (occasional ).  Psychiatric/Behavioral: Negative.       Objective:    Physical Exam    Constitutional: He is oriented to person, place, and time. He appears well-developed and well-nourished.  HENT:  Head: Normocephalic and atraumatic.  Eyes: Conjunctivae are normal.  Cardiovascular: Normal rate, regular rhythm, normal heart sounds and intact distal pulses.  Pulmonary/Chest: Effort normal and breath sounds normal.  Abdominal: Soft. Bowel sounds are normal.  Musculoskeletal:        General: Normal range of motion.     Cervical back: Normal range of motion and neck supple.  Neurological: He is alert and oriented to person, place, and time. He has normal reflexes.  Skin: Skin is warm and dry.  Psychiatric: He has a normal mood and affect. His behavior is normal. Judgment and thought content normal.  Nursing note and vitals reviewed.   BP (!) 154/90   Pulse 97   Temp 98.6 F (37 C)   Ht 5' 7"  (1.702 m)   Wt 146 lb 12.8 oz (66.6 kg)   SpO2 100%   BMI 22.99 kg/m  Wt Readings from Last 3 Encounters:  08/06/19 146 lb 12.8 oz (66.6 kg)  07/27/19 147 lb 3.2 oz (66.8 kg)  07/09/19 144 lb 6.4 oz (65.5 kg)     Health Maintenance Due  Topic Date Due  . OPHTHALMOLOGY EXAM  Never done  . FOOT EXAM  08/20/2017    There are no preventive care reminders to display for this patient.  Lab Results  Component Value Date   TSH 2.470 01/22/2019   Lab Results  Component Value Date   WBC 6.8 06/05/2019   HGB 10.6 (L) 06/05/2019   HCT 32.1 (L) 06/05/2019   MCV 82.9 06/05/2019   PLT 412 (H) 06/05/2019   Lab Results  Component Value Date   NA 130 (L) 06/05/2019   K 5.6 (H) 06/05/2019   CO2 28 06/05/2019   GLUCOSE 484 (H) 06/05/2019   BUN 29 (H) 06/05/2019   CREATININE 2.05 (H) 06/05/2019   BILITOT <0.2 07/27/2019   ALKPHOS 115 07/27/2019   AST 19 07/27/2019   ALT 7 07/27/2019   PROT 6.1 07/27/2019   ALBUMIN 3.2 (L) 07/27/2019   CALCIUM 8.1 (L) 06/05/2019   ANIONGAP 11 06/05/2019   Lab Results  Component Value Date   CHOL 176 07/27/2019   Lab Results   Component Value Date   HDL 44 07/27/2019   Lab Results  Component Value Date  LDLCALC 114 (H) 07/27/2019   Lab Results  Component Value Date   TRIG 100 07/27/2019   Lab Results  Component Value Date   CHOLHDL 4.0 07/27/2019   Lab Results  Component Value Date   HGBA1C 14.2 (A) 08/06/2019      Assessment & Plan:   1. Uncontrolled type 2 diabetes mellitus with peripheral neuropathy ([place]) He will begin taking all medication as prescribed, to decrease foods/beverages high in sugars and carbs and follow Heart Healthy or DASH diet. Increase physical activity to at least 30 minutes cardio exercise daily.  - POCT glycosylated hemoglobin (Hb A1C) - POCT urinalysis dipstick - POCT glucose (manual entry)  2. Hemoglobin A1C greater than 9%, indicating poor diabetic control Worsened. Hgb A1c is increased at 14.2 today, from 13.3 on 04/08/2019. He will restart taking his medications as prescribed today.   3. Elevated glucose  4. Essential hypertension Blood pressure is stable today. He will continue to take medications as prescribed, to decrease high sodium intake, excessive alcohol intake, increase potassium intake, smoking cessation, and increase physical activity of at least 30 minutes of cardio activity daily. He will continue to follow Heart Healthy or DASH diet.  5. Anxiety Stable.   6. Non compliance on medication We will once again send refills to [facility] for pick up today. Patient states that he is on his way there now to pick up his medications today.   7. Follow up He will follow up in 3 months.   No orders of the defined types were placed in this encounter.  Orders Placed This Encounter  Procedures  . POCT glycosylated hemoglobin (Hb A1C)  . POCT urinalysis dipstick  . POCT glucose (manual entry)    Referral Orders  No referral(s) requested today    Kathe Becton,  MSN, FNP-BC [address] [place], [place] [address] [phone]- fax   Problem List Items Addressed This Visit      Cardiovascular and Mediastinum   Essential hypertension     Endocrine   Uncontrolled type 2 diabetes mellitus with peripheral neuropathy ([place]) - Primary   Relevant Orders   POCT glycosylated hemoglobin (Hb A1C) (Completed)   POCT urinalysis dipstick (Completed)   POCT glucose (manual entry) (Completed)     Other   Elevated glucose    Other Visit Diagnoses    Hemoglobin A1C greater than 9%, indicating poor diabetic control       Anxiety       Follow up          No orders of the defined types were placed in this encounter.   Follow-up: Return in about 3 months (around 11/06/2019).    [name], FNP

## 2019-08-09 ENCOUNTER — Telehealth: Payer: Self-pay | Admitting: Family Medicine

## 2019-08-10 NOTE — Telephone Encounter (Signed)
Done

## 2019-08-11 ENCOUNTER — Other Ambulatory Visit: Payer: Self-pay | Admitting: Gastroenterology

## 2019-08-16 NOTE — Telephone Encounter (Signed)
No call to patient

## 2019-08-16 NOTE — Telephone Encounter (Signed)
No call to patient.

## 2019-08-30 ENCOUNTER — Other Ambulatory Visit: Payer: Self-pay

## 2019-08-30 ENCOUNTER — Ambulatory Visit (HOSPITAL_COMMUNITY)
Admission: EM | Admit: 2019-08-30 | Discharge: 2019-08-30 | Disposition: A | Discharge status: Home / Self Care | Payer: Self-pay | Attending: Family Medicine | Admitting: Family Medicine

## 2019-08-30 ENCOUNTER — Encounter (HOSPITAL_COMMUNITY): Payer: Self-pay

## 2019-08-30 DIAGNOSIS — I10 Essential (primary) hypertension: Secondary | ICD-10-CM

## 2019-08-30 DIAGNOSIS — M62830 Muscle spasm of back: Secondary | ICD-10-CM

## 2019-08-30 MED ORDER — CYCLOBENZAPRINE HCL 10 MG PO TABS: ORAL_TABLET | ORAL | 0 refills | Status: DC

## 2019-08-30 MED ORDER — DICLOFENAC SODIUM 75 MG PO TBEC: 75.0000 mg | DELAYED_RELEASE_TABLET | Freq: Two times a day (BID) | ORAL | 0 refills | Status: DC

## 2019-08-30 MED FILL — !LEVEMIR 100 UNITS/ML VIAL: 100/ML | 28 days supply | Qty: 10 | Fill #0

## 2019-08-30 MED FILL — CYCLOBENZAPRINE 10 MG TAB: 10 | 7 days supply | Qty: 21 | Fill #0

## 2019-08-30 MED FILL — ?ATORVASTATIN 20 MG TABLET: 20 | 30 days supply | Qty: 30 | Fill #0

## 2019-08-30 MED FILL — ?HUMALOG 100 UNITS/ML VIAL: 100 | 25 days supply | Qty: 10 | Fill #0

## 2019-08-30 MED FILL — DICLOFENAC SOD EC 75 MG TAB: 75 | 7 days supply | Qty: 14 | Fill #0

## 2019-08-30 NOTE — ED Triage Notes (Signed)
Pt reports having middle back pain x 4-5 days, worse when he walks.

## 2019-08-30 NOTE — ED Provider Notes (Signed)
[place]   [id number] 08/30/19 Arrival Time: 5462  ASSESSMENT & PLAN:  1. Muscle spasm of back   2. Elevated blood pressure reading in office with diagnosis of hypertension     Able to ambulate here and hemodynamically stable. No indication for imaging of back at this time given no trauma and normal neurological exam. Discussed.  Begin: Meds ordered this encounter  Medications  . diclofenac (VOLTAREN) 75 MG EC tablet    Sig: Take 1 tablet (75 mg total) by mouth 2 (two) times daily.    Dispense:  14 tablet    Refill:  0  . cyclobenzaprine (FLEXERIL) 10 MG tablet    Sig: Take 1 tablet by mouth 3 times daily as needed for muscle spasm. Warning: May cause drowsiness.    Dispense:  21 tablet    Refill:  0    Medication sedation precautions given. Encourage ROM/movement as tolerated. Work note provided.  Recommend: Follow-up Information    [facility].   Why: If worsening or failing to improve as anticipated. Contact information: [address] [facility] [phone]           Discharge Instructions     Your blood pressure was noted to be elevated during your visit today. If you are currently taking medication for high blood pressure, please ensure you are taking this as directed. If you do not have a history of high blood pressure and your blood pressure remains persistently elevated, you may need to begin taking a medication at some point. You may return here within the next few days to recheck if unable to see your primary care provider or if do not have a one.  BP (!) 174/93 (BP Location: Right Arm)   Pulse 98   Temp 98.3 F (36.8 C) (Oral)   Resp 17   SpO2 99%        Reviewed expectations re: course of current medical issues. Questions answered. Outlined signs and symptoms indicating need for more acute intervention. Patient verbalized understanding. After Visit Summary  given.   SUBJECTIVE: History from: patient.  [name] is a 49 y.o. male who presents with complaint of fairly persistent right sided mid/lower back discomfort. Onset gradual. First noted 4-5 d ago. Injury/trama: no. History of back problems requiring medical care: none reported. Pain described as aching and with random sharp pain; without radiation. Aggravating factors: certain movements and prolonged walking/standing. Alleviating factors: have not been identified. Progressive LE weakness or saddle anesthesia: none. Extremity sensation changes or weakness: none. Ambulatory without difficulty. Normal bowel/bladder habits: yes; without urinary retention. Normal PO intake without n/v. No associated abdominal pain/n/v. Self treatment: has Tylenol without much relief.  Reports no chronic steroid use, fevers, IV drug use, or recent back surgeries or procedures.  Increased blood pressure noted today. Reports that he is treated for HTN.  He reports not taking medications regularly as instructed, no chest pain on exertion, no dyspnea on exertion, no swelling of ankles, no orthostatic dizziness or lightheadedness, no orthopnea or paroxysmal nocturnal dyspnea and no palpitations.   OBJECTIVE:  Vitals:   08/30/19 1253  BP: (!) 174/93  Pulse: 98  Resp: 17  Temp: 98.3 F (36.8 C)  TempSrc: Oral  SpO2: 99%    General appearance: alert; no distress HEENT: [place]; AT Neck: supple with FROM; without midline tenderness CV: RRR Lungs: unlabored respirations; symmetrical air entry Abdomen: soft, non-tender; non-distended Back: moderate and poorly localized  tenderness to palpation over R mid/lower paraspinal musculature; FROM at waist; bruising: none; without midline tenderness Extremities: without edema; symmetrical without gross deformities; normal ROM of bilateral LE Skin: warm and dry Neurologic: normal gait; normal Psychological: alert and cooperative; normal mood and affect    Allergies   Allergen Reactions  . Sulfa Antibiotics Rash    Severe rash. Do not give sulfa medications  . Bactrim [Sulfamethoxazole-Trimethoprim]     Rash  . Cephalexin     Patient with severe drug reaction including exfoliating skin rash and hypotension after being prescribed both cephalexin and sulfamethoxazole-trimethoprim simultaneously. Favor SMX as much more likely culprit as patient had tolerated other cephalosporins in the past, but cannot say with complete certainty that this was not related to cephalexin.   . Other     Patient states he is allergic to the TB test    Past Medical History:  Diagnosis Date  . Anxiety 02/2019  . Diabetes mellitus type II, uncontrolled ([place]) 07/17/2006  . Elevated alkaline phosphatase level 01/2019  . ESSENTIAL HYPERTENSION 07/17/2006  . HYPERLIPIDEMIA 11/24/2008  . Insomnia 02/2019  . Left bundle branch block 02/22/2019  . Pneumonia 10/22/2011  . Suicidal ideations 02/2019  . Vitamin D deficiency 01/2019   Social History   Socioeconomic History  . Marital status: Legally Separated    Spouse name: Not on file  . Number of children: Not on file  . Years of education: Not on file  . Highest education level: Not on file  Occupational History  . 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 Occupation: Biochemist, clinical    Comment: Updated June 2013  . Occupation: Engineer, civil (consulting)    Comment: Began Jan 2014  Tobacco Use  . Smoking status: Never Smoker  . Smokeless tobacco: Never Used  Substance and Sexual Activity  . Alcohol use: No    Alcohol/week: 0.0 standard drinks  . Drug use: No  . Sexual activity: Yes    Partners: Female  Other Topics Concern  . Not on file  Social History Narrative   Lives with his mother and brother.   Wife has had a foot amputation; is a type 1 DM.   Social Determinants of Health   Financial Resource Strain:   . Difficulty of Paying Living Expenses:   Food Insecurity:   . Worried About Charity fundraiser in the Last  Year:   . Arboriculturist in the Last Year:   Transportation Needs:   . Film/video editor (Medical):   Marland Kitchen Lack of Transportation (Non-Medical):   Physical Activity:   . Days of Exercise per Week:   . Minutes of Exercise per Session:   Stress:   . Feeling of Stress :   Social Connections:   . Frequency of Communication with Friends and Family:   . Frequency of Social Gatherings with Friends and Family:   . Attends Religious Services:   . Active Member of Clubs or Organizations:   . Attends Archivist Meetings:   Marland Kitchen Marital Status:   Intimate Partner Violence:   . Fear of Current or Ex-Partner:   . Emotionally Abused:   Marland Kitchen Physically Abused:   . Sexually Abused:    Family History  Problem Relation Age of Onset  . Hypertension Mother 35  . Cancer Mother        bladder cancer  . Alcohol abuse Father   . Hypertension Brother    Past Surgical History:  Procedure  Laterality Date  . ARM SURGERY       Vanessa Kick, MD 08/30/19 1341

## 2019-08-30 NOTE — Discharge Instructions (Signed)
Your blood pressure was noted to be elevated during your visit today. If you are currently taking medication for high blood pressure, please ensure you are taking this as directed. If you do not have a history of high blood pressure and your blood pressure remains persistently elevated, you may need to begin taking a medication at some point. You may return here within the next few days to recheck if unable to see your primary care provider or if do not have a one.  BP (!) 174/93 (BP Location: Right Arm)   Pulse 98   Temp 98.3 F (36.8 C) (Oral)   Resp 17   SpO2 99%

## 2019-09-07 ENCOUNTER — Telehealth: Payer: Self-pay

## 2019-09-07 NOTE — Telephone Encounter (Signed)
Mrs. Turay wanted know if her son's should take the COVID vaccine.

## 2019-09-15 ENCOUNTER — Other Ambulatory Visit: Payer: Self-pay | Admitting: Nephrology

## 2019-09-15 DIAGNOSIS — N182 Chronic kidney disease, stage 2 (mild): Secondary | ICD-10-CM

## 2019-09-17 ENCOUNTER — Other Ambulatory Visit: Payer: Self-pay

## 2019-09-23 ENCOUNTER — Other Ambulatory Visit: Payer: Self-pay | Admitting: Gastroenterology

## 2019-09-24 ENCOUNTER — Other Ambulatory Visit (HOSPITAL_COMMUNITY)
Admission: RE | Admit: 2019-09-24 | Discharge: 2019-09-24 | Disposition: A | Discharge status: Home / Self Care | Payer: HRSA Program | Source: Ambulatory Visit | Attending: Gastroenterology | Admitting: Gastroenterology

## 2019-09-24 ENCOUNTER — Ambulatory Visit
Admission: RE | Admit: 2019-09-24 | Discharge: 2019-09-24 | Disposition: A | Discharge status: Home / Self Care | Payer: Self-pay | Source: Ambulatory Visit | Attending: Nephrology | Admitting: Nephrology

## 2019-09-24 DIAGNOSIS — Z01812 Encounter for preprocedural laboratory examination: Secondary | ICD-10-CM | POA: Diagnosis present

## 2019-09-24 DIAGNOSIS — Z20822 Contact with and (suspected) exposure to covid-19: Secondary | ICD-10-CM | POA: Insufficient documentation

## 2019-09-24 DIAGNOSIS — N182 Chronic kidney disease, stage 2 (mild): Secondary | ICD-10-CM

## 2019-09-25 LAB — SARS CORONAVIRUS 2 (TAT 6-24 HRS): SARS Coronavirus 2: NEGATIVE

## 2019-09-27 ENCOUNTER — Ambulatory Visit (HOSPITAL_COMMUNITY): Payer: Self-pay | Admitting: Anesthesiology

## 2019-09-27 NOTE — Anesthesia Preprocedure Evaluation (Deleted)
Anesthesia Evaluation  Patient identified by MRN, date of birth, ID band Patient awake    Reviewed: Allergy & Precautions, NPO status , Patient's Chart, lab work & pertinent test results  Airway Mallampati: II  TM Distance: >3 FB Neck ROM: Full    Dental no notable dental hx. (+) Teeth Intact, Dental Advisory Given   Pulmonary neg pulmonary ROS,    Pulmonary exam normal breath sounds clear to auscultation       Cardiovascular hypertension, Pt. on medications Normal cardiovascular exam Rhythm:Regular Rate:Normal     Neuro/Psych Anxiety negative neurological ROS     GI/Hepatic Neg liver ROS, hiatal hernia,   Endo/Other  diabetes, Type 2, Insulin Dependent, Oral Hypoglycemic Agents  Renal/GU negative Renal ROS     Musculoskeletal negative musculoskeletal ROS (+)   Abdominal   Peds  Hematology negative hematology ROS (+)   Anesthesia Other Findings   Reproductive/Obstetrics                           Anesthesia Physical Anesthesia Plan  ASA: II  Anesthesia Plan: MAC   Post-op Pain Management:    Induction:   PONV Risk Score and Plan: Treatment may vary due to age or medical condition  Airway Management Planned: Nasal Cannula and Natural Airway  Additional Equipment: None  Intra-op Plan:   Post-operative Plan:   Informed Consent: I have reviewed the patients History and Physical, chart, labs and discussed the procedure including the risks, benefits and alternatives for the proposed anesthesia with the patient or authorized representative who has indicated his/her understanding and acceptance.     Dental advisory given  Plan Discussed with: CRNA  Anesthesia Plan Comments: (Screening colonoscopy under MAC  Case cancelled in Pre op BY Dr Michail Sermon Pt w BS 360 and inadequate prep)      Anesthesia Quick Evaluation

## 2019-09-28 ENCOUNTER — Encounter (HOSPITAL_COMMUNITY)
Admission: RE | Disposition: A | Discharge status: Home / Self Care | Payer: Self-pay | Source: Home / Self Care | Attending: Gastroenterology

## 2019-09-28 ENCOUNTER — Other Ambulatory Visit: Payer: Self-pay

## 2019-09-28 ENCOUNTER — Encounter (HOSPITAL_COMMUNITY): Payer: Self-pay | Admitting: Gastroenterology

## 2019-09-28 ENCOUNTER — Ambulatory Visit (HOSPITAL_COMMUNITY)
Admission: RE | Admit: 2019-09-28 | Discharge: 2019-09-28 | Disposition: A | Discharge status: Home / Self Care | Payer: Self-pay | Attending: Gastroenterology | Admitting: Gastroenterology

## 2019-09-28 DIAGNOSIS — Z539 Procedure and treatment not carried out, unspecified reason: Secondary | ICD-10-CM | POA: Insufficient documentation

## 2019-09-28 LAB — GLUCOSE, CAPILLARY: Glucose-Capillary: 360 mg/dL — ABNORMAL HIGH (ref 70–99)

## 2019-09-28 SURGERY — CANCELLED PROCEDURE
Anesthesia: Monitor Anesthesia Care

## 2019-09-28 MED ORDER — SODIUM CHLORIDE 0.9 % IV SOLN: INTRAVENOUS | Status: DC

## 2019-09-28 SURGICAL SUPPLY — 22 items

## 2019-09-28 NOTE — Progress Notes (Signed)
Pt told admitting nurse that he only drank 1/4 of prep. Pt bp 200/113, then 168/109  Pt blood sugar 360. Dr. Valma Cava made aware of blood pressure and blood sugar and Dr. Michail Sermon made aware of pt only driniking 1/4 of prep. Dr. Michail Sermon and Dr. Valma Cava cancelling procedure due to poor prep and blood pressure and blood sugar. Advised patient to see pt about blood pressure and blood sugar.

## 2019-10-01 ENCOUNTER — Ambulatory Visit: Payer: Self-pay | Admitting: Family Medicine

## 2019-10-01 ENCOUNTER — Other Ambulatory Visit: Payer: Self-pay

## 2019-10-06 ENCOUNTER — Ambulatory Visit: Admission: RE | Admit: 2019-10-06 | Payer: Self-pay | Source: Ambulatory Visit

## 2019-10-06 ENCOUNTER — Encounter: Payer: Self-pay | Admitting: Nurse Practitioner

## 2019-10-06 ENCOUNTER — Ambulatory Visit (INDEPENDENT_AMBULATORY_CARE_PROVIDER_SITE_OTHER): Payer: Self-pay | Admitting: Nurse Practitioner

## 2019-10-06 ENCOUNTER — Other Ambulatory Visit: Payer: Self-pay

## 2019-10-06 VITALS — BP 173/90 | HR 98 | Ht 67.0 in | Wt 154.0 lb

## 2019-10-06 DIAGNOSIS — E1165 Type 2 diabetes mellitus with hyperglycemia: Secondary | ICD-10-CM

## 2019-10-06 DIAGNOSIS — R600 Localized edema: Secondary | ICD-10-CM

## 2019-10-06 DIAGNOSIS — IMO0002 Reserved for concepts with insufficient information to code with codable children: Secondary | ICD-10-CM

## 2019-10-06 DIAGNOSIS — H539 Unspecified visual disturbance: Secondary | ICD-10-CM

## 2019-10-06 DIAGNOSIS — E1142 Type 2 diabetes mellitus with diabetic polyneuropathy: Secondary | ICD-10-CM

## 2019-10-06 LAB — POCT URINALYSIS DIPSTICK
Bilirubin, UA: NEGATIVE
Glucose, UA: POSITIVE — AB
Ketones, UA: NEGATIVE
Leukocytes, UA: NEGATIVE
Nitrite, UA: NEGATIVE
Protein, UA: POSITIVE — AB
Spec Grav, UA: 1.025 (ref 1.010–1.025)
Urobilinogen, UA: 0.2 E.U./dL
pH, UA: 6.5 (ref 5.0–8.0)

## 2019-10-06 LAB — POCT GLUCOSE (DEVICE FOR HOME USE)
Glucose Fasting, POC: 360 mg/dL — AB (ref 70–99)
POC Glucose: 360 mg/dl — AB (ref 70–99)

## 2019-10-06 MED ORDER — FUROSEMIDE 20 MG PO TABS: 20.0000 mg | ORAL_TABLET | Freq: Every day | ORAL | 0 refills | Status: DC

## 2019-10-06 MED FILL — ?FUROSEMIDE 20MG TABLET: 20 | 5 days supply | Qty: 5 | Fill #0
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
## 2019-10-06 NOTE — Patient Instructions (Signed)
Edema  Edema is when you have too much fluid in your body or under your skin. Edema may make your legs, feet, and ankles swell up. Swelling is also common in looser tissues, like around your eyes. This is a common condition. It gets more common as you get older. There are many possible causes of edema. Eating too much salt (sodium) and being on your feet or sitting for a long time can cause edema in your legs, feet, and ankles. Hot weather may make edema worse. Edema is usually painless. Your skin may look swollen or shiny. Follow these instructions at home:  Keep the swollen body part raised (elevated) above the level of your heart when you are sitting or lying down.  Do not sit still or stand for a long time.  Do not wear tight clothes. Do not wear garters on your upper legs.  Exercise your legs. This can help the swelling go down.  Wear elastic bandages or support stockings as told by your doctor.  Eat a low-salt (low-sodium) diet to reduce fluid as told by your doctor.  Depending on the cause of your swelling, you may need to limit how much fluid you drink (fluid restriction).  Take over-the-counter and prescription medicines only as told by your doctor. Contact a doctor if:  Treatment is not working.  You have heart, liver, or kidney disease and have symptoms of edema.  You have sudden and unexplained weight gain. Get help right away if:  You have shortness of breath or chest pain.  You cannot breathe when you lie down.  You have pain, redness, or warmth in the swollen areas.  You have heart, liver, or kidney disease and get edema all of a sudden.  You have a fever and your symptoms get worse all of a sudden. Summary  Edema is when you have too much fluid in your body or under your skin.  Edema may make your legs, feet, and ankles swell up. Swelling is also common in looser tissues, like around your eyes.  Raise (elevate) the swollen body part above the level of your  heart when you are sitting or lying down.  Follow your doctor's instructions about diet and how much fluid you can drink (fluid restriction). This information is not intended to replace advice given to you by your health care provider. Make sure you discuss any questions you have with your health care provider. Document Revised: 05/09/2017 Document Reviewed: 05/24/2016 Elsevier Patient Education  2020 [employer]vier Inc.  

## 2019-10-06 NOTE — Addendum Note (Signed)
Addended by: Tyrone Apple on: 10/06/2019 04:15 PM   Modules accepted: Orders

## 2019-10-07 ENCOUNTER — Other Ambulatory Visit: Payer: Self-pay | Admitting: Nurse Practitioner

## 2019-10-07 ENCOUNTER — Ambulatory Visit (HOSPITAL_COMMUNITY)
Admission: RE | Admit: 2019-10-07 | Discharge: 2019-10-07 | Disposition: A | Discharge status: Home / Self Care | Payer: Self-pay | Source: Ambulatory Visit | Attending: Nurse Practitioner | Admitting: Nurse Practitioner

## 2019-10-07 ENCOUNTER — Telehealth: Payer: Self-pay | Admitting: Clinical

## 2019-10-07 ENCOUNTER — Encounter: Payer: Self-pay | Admitting: Nurse Practitioner

## 2019-10-07 DIAGNOSIS — E1142 Type 2 diabetes mellitus with diabetic polyneuropathy: Secondary | ICD-10-CM

## 2019-10-07 DIAGNOSIS — R7989 Other specified abnormal findings of blood chemistry: Secondary | ICD-10-CM

## 2019-10-07 DIAGNOSIS — R600 Localized edema: Secondary | ICD-10-CM | POA: Insufficient documentation

## 2019-10-07 DIAGNOSIS — IMO0002 Reserved for concepts with insufficient information to code with codable children: Secondary | ICD-10-CM

## 2019-10-07 LAB — COMP. METABOLIC PANEL (12)
AST: 21 IU/L (ref 0–40)
Albumin/Globulin Ratio: 1.3 (ref 1.2–2.2)
Albumin: 3.2 g/dL — ABNORMAL LOW (ref 4.0–5.0)
Alkaline Phosphatase: 151 IU/L — ABNORMAL HIGH (ref 48–121)
BUN/Creatinine Ratio: 13 (ref 9–20)
BUN: 21 mg/dL (ref 6–24)
Bilirubin Total: <0.2 mg/dL (ref 0.0–1.2)
Calcium: 8.5 mg/dL — ABNORMAL LOW (ref 8.7–10.2)
Chloride: 104 mmol/L (ref 96–106)
Creatinine, Ser: 1.67 mg/dL — ABNORMAL HIGH (ref 0.76–1.27)
GFR calc Af Amer: 56 mL/min/{1.73_m2} — ABNORMAL LOW (ref >59)
GFR calc non Af Amer: 48 mL/min/{1.73_m2} — ABNORMAL LOW (ref >59)
Globulin, Total: 2.5 g/dL (ref 1.5–4.5)
Glucose: 199 mg/dL — ABNORMAL HIGH (ref 65–99)
Potassium: 3.7 mmol/L (ref 3.5–5.2)
Sodium: 141 mmol/L (ref 134–144)
Total Protein: 5.7 g/dL — ABNORMAL LOW (ref 6.0–8.5)

## 2019-10-07 LAB — BRAIN NATRIURETIC PEPTIDE: BNP: 388.2 pg/mL — ABNORMAL HIGH (ref 0.0–100.0)

## 2019-10-07 NOTE — Progress Notes (Signed)
Lower venous duplex       has been completed. Preliminary results can be found under CV proc through chart review. Javayah Magaw, BS, RDMS, RVT   

## 2019-10-07 NOTE — Telephone Encounter (Signed)
[facility]ated [facility] Case Management Referral Note  10/07/2019 Name: [name] MRN: [id number] DOB: [birth year] [name] is a 49 y.o. year old male who sees [name], FNP for primary care. LCSW was consulted to assess patient's access to community resources and assist the patient with Financial Difficulties related to low income and lack of insurance.  Assessment: Patient experiencing Financial constraints related to low income and lack of insurance. Patient also in need of an eye exam. CSW consulted by PCP after patient's recent visit. Called patient today to follow up. Patient not currently insured though plans to apply for Medicaid and is picking up an application today; has been denied for Medicaid in the past. Advised that if patient is denied for Medicaid again he may apply for the [facility]. Also has pending application for social security disability. Patient has some income from employment. Assessed for eligibility for [facility] for eye exam and patient does meet the eligibility requirements. Patient to provide proof of income documents to accompany application. CSW to submit application on behalf of patient after all documents collected and will support with follow up.   Review of patient status, including review of consultants reports, relevant laboratory and other test results, and collaboration with appropriate care team members and the patient's provider was performed as part of comprehensive patient evaluation and provision of services.    SDOH (Social Determinants of Health) assessments performed: No  Outpatient Encounter Medications as of 10/07/2019  Medication Sig  . amLODipine (NORVASC) 5 MG tablet Take 1 tablet (5 mg total) by mouth daily.  Marland Kitchen atorvastatin (LIPITOR) 20 MG tablet Take 1 tablet (20 mg total) by mouth daily.  . furosemide (LASIX) 20 MG tablet Take 1 tablet (20 mg total) by mouth daily for 5 days.  Marland Kitchen glucose blood test strip Use to  check fasting blood sugar once daily. 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 Insulin dependent  . insulin detemir (LEVEMIR) 100 UNIT/ML injection Inject 0.3 mLs (30 Units total) into the skin daily.  . insulin lispro (HUMALOG) 100 UNIT/ML injection Inject 20 units under skin, 2 times a day. (Patient taking differently: Inject 20 Units into the skin 2 (two) times daily before a meal. Inject 20 units under skin, 2 times a day.)  . Insulin Syringes, Disposable, U-100 0.3 ML MISC 100 Syringes by Does not apply route 3 (three) times daily with meals as needed.  . metFORMIN (GLUCOPHAGE) 1000 MG tablet Take 1 tablet (1,000 mg total) by mouth 2 (two) times daily with a meal. (Patient not taking: Reported on 09/20/2019)  . sildenafil (VIAGRA) 25 MG tablet Take 1 tablet (25 mg total) by mouth as needed for erectile dysfunction. for erectile dysfunction.  . [DISCONTINUED] carvedilol (COREG) 6.25 MG tablet Take 1 tablet (6.25 mg total) by mouth 2 (two) times daily. (Patient not taking: Reported on 07/27/2019)  . [DISCONTINUED] gabapentin (NEURONTIN) 300 MG capsule Take 1 capsule (300 mg total) by mouth 3 (three) times daily. (Patient not taking: Reported on 07/27/2019)   No facility-administered encounter medications on file as of 10/07/2019.    Goals Addressed   None      Follow up Plan: 1. Patient to provide supporting documents for [facility] application 2. CSW to submit application and support with follow up  Estanislado Emms, [facility] [phone]

## 2019-10-07 NOTE — Progress Notes (Unsigned)
re

## 2019-10-08 ENCOUNTER — Telehealth: Payer: Self-pay

## 2019-10-08 NOTE — Telephone Encounter (Signed)
-----   Message from Vevelyn Francois, NP sent at 10/07/2019  1:18 PM EDT ----- Can you make him aware that the study was negative for a DVT. See if he has started the medication and if the swelling has improved. Thanks

## 2019-10-08 NOTE — Telephone Encounter (Signed)
Shirlean Mylar called an spoke with patient's mother .

## 2019-10-08 NOTE — Telephone Encounter (Signed)
Received a message via secure chat to have patient be seen after seeing Internal Med appointment needed for next week to evaluate swelling, and BP issues. I attempted to contact patient for 7 day hold spot next Friday- was unable to leave message as voicemail was not set up-  Will route to scheduling to continue to get patient an appointment with Dr.Berry for next week.  Thanks!

## 2019-10-08 NOTE — Telephone Encounter (Signed)
Per Mrs. Ramstad (Mother) patient started the medication last night. She will have him give [place] a call back with an update on the swelling.   (Advised "NOT" to put a heating pad on the area).

## 2019-10-13 ENCOUNTER — Ambulatory Visit (INDEPENDENT_AMBULATORY_CARE_PROVIDER_SITE_OTHER): Payer: Self-pay | Admitting: Family Medicine

## 2019-10-13 ENCOUNTER — Encounter: Payer: Self-pay | Admitting: Family Medicine

## 2019-10-13 ENCOUNTER — Other Ambulatory Visit: Payer: Self-pay

## 2019-10-13 VITALS — BP 129/83 | HR 103 | Temp 98.8°F | Ht 67.0 in | Wt 143.6 lb

## 2019-10-13 DIAGNOSIS — Z09 Encounter for follow-up examination after completed treatment for conditions other than malignant neoplasm: Secondary | ICD-10-CM

## 2019-10-13 DIAGNOSIS — R739 Hyperglycemia, unspecified: Secondary | ICD-10-CM

## 2019-10-13 DIAGNOSIS — E119 Type 2 diabetes mellitus without complications: Secondary | ICD-10-CM

## 2019-10-13 DIAGNOSIS — E1165 Type 2 diabetes mellitus with hyperglycemia: Secondary | ICD-10-CM

## 2019-10-13 DIAGNOSIS — N529 Male erectile dysfunction, unspecified: Secondary | ICD-10-CM

## 2019-10-13 DIAGNOSIS — R7309 Other abnormal glucose: Secondary | ICD-10-CM

## 2019-10-13 DIAGNOSIS — IMO0002 Reserved for concepts with insufficient information to code with codable children: Secondary | ICD-10-CM

## 2019-10-13 DIAGNOSIS — E1142 Type 2 diabetes mellitus with diabetic polyneuropathy: Secondary | ICD-10-CM

## 2019-10-13 DIAGNOSIS — I10 Essential (primary) hypertension: Secondary | ICD-10-CM

## 2019-10-13 LAB — GLUCOSE, POCT (MANUAL RESULT ENTRY): POC Glucose: 477 mg/dl — AB (ref 70–99)

## 2019-10-13 MED ORDER — METFORMIN HCL 1000 MG PO TABS: 1000.0000 mg | ORAL_TABLET | Freq: Two times a day (BID) | ORAL | 3 refills | Status: DC

## 2019-10-13 MED ORDER — SILDENAFIL CITRATE 25 MG PO TABS: 25.0000 mg | ORAL_TABLET | ORAL | 3 refills | Status: DC | PRN

## 2019-10-13 MED ORDER — INSULIN LISPRO 100 UNIT/ML ~~LOC~~ SOLN
20.0000 [IU] | Freq: Once | SUBCUTANEOUS | Status: AC
Start: 2019-10-13 — End: 2019-10-13
  Administered 2019-10-13: 20 [IU] via SUBCUTANEOUS

## 2019-10-13 MED FILL — metFORMIN HCL 1000 MG TABS: 1000 | 30 days supply | Qty: 60 | Fill #0

## 2019-10-13 MED FILL — !VIAGRA 25 MG TABLET: 25 | 30 days supply | Qty: 10 | Fill #0

## 2019-10-13 NOTE — Progress Notes (Signed)
Patient [facility]   Established Patient Office Visit  Subjective:  Patient ID: [name], male    DOB: 11/19/1970  Age: 49 y.o. MRN: [id number]  CC:  Chief Complaint  Patient presents with  . Follow-up    DM, feet swelling    HPI [name] is a year old male who presents for Follow Up today.   Past Medical History:  Diagnosis Date  . Anxiety 02/2019  . Diabetes mellitus type II, uncontrolled ([place]) 07/17/2006  . Elevated alkaline phosphatase level 01/2019  . ESSENTIAL HYPERTENSION 07/17/2006  . HYPERLIPIDEMIA 11/24/2008  . Insomnia 02/2019  . Left bundle branch block 02/22/2019  . Pneumonia 10/22/2011  . Suicidal ideations 02/2019  . Vitamin D deficiency 01/2019   Patient Active Problem List   Diagnosis Date Noted  . Elevated brain natriuretic peptide (BNP) level 10/07/2019  . Non compliance with medical treatment 08/06/2019  . Left ventricular dysfunction 04/27/2019  . Hypertensive urgency 04/07/2019  . Chest pain 04/07/2019  . Dyspnea on exertion 03/04/2019  . Elevated glucose 07/29/2018  . Muscle spasms of both lower extremities 07/29/2018  . Skin rash 07/29/2018  . 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 Healthcare maintenance 07/02/2016  . New onset left bundle branch block (LBBB)   . 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 Hyperlipidemia LDL goal <70 11/24/2008  . Uncontrolled type 2 diabetes mellitus with peripheral neuropathy ([place]) 07/17/2006  . Essential hypertension 07/17/2006   Current Status: Since his last office visit, he is doing well with no complaints. He is currently not taking his medications as prescribed. He denies visual changes, chest pain, cough, shortness of breath, heart palpitations, and falls. He has occasional headaches and dizziness with position changes. Denies severe headaches, confusion, seizures, and double vision. Denies nausea and vomiting. He has not been checking  his blood glucose levels regularly. He has c/o blurry vision today. He denies fatigue, frequent urination, excessive hunger, excessive thirst, weight gain, weight loss, and poor wound healing. He continues to check his feet regularly. He denies fevers, chills, fatigue, recent infections, weight loss, and night sweats. Denies GI problems such as nausea, vomiting, diarrhea, and constipation. He has no reports of blood in stools, dysuria and hematuria. No depression or anxiety reported today. He denies suicidal ideations, homicidal ideations, or auditory hallucinations. He denies pain today.   Past Surgical History:  Procedure Laterality Date  . ARM SURGERY      Family History  Problem Relation Age of Onset  . Hypertension Mother 48  . Cancer Mother        bladder cancer  . Alcohol abuse Father   . Hypertension Brother     Social History   Socioeconomic History  . Marital status: Legally Separated    Spouse name: Not on file  . Number of children: Not on file  . Years of education: Not on file  . Highest education level: Not on file  Occupational History  . 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 Occupation: Biochemist, clinical    Comment: Updated June 2013  . Occupation: Engineer, civil (consulting)    Comment: Began Jan 2014  Tobacco Use  . Smoking status: Never Smoker  . Smokeless tobacco: Never Used  Substance and Sexual Activity  . Alcohol use: No    Alcohol/week: 0.0 standard drinks  . Drug use: No  . Sexual activity: Yes    Partners: Female  Other Topics Concern  .  Not on file  Social History Narrative   Lives with his mother and brother.   Wife has had a foot amputation; is a type 1 DM.   Social Determinants of Health   Financial Resource Strain:   . Difficulty of Paying Living Expenses:   Food Insecurity:   . Worried About Charity fundraiser in the Last Year:   . Arboriculturist in the Last Year:   Transportation Needs:   . Film/video editor (Medical):   Marland Kitchen Lack of  Transportation (Non-Medical):   Physical Activity:   . Days of Exercise per Week:   . Minutes of Exercise per Session:   Stress:   . Feeling of Stress :   Social Connections:   . Frequency of Communication with Friends and Family:   . Frequency of Social Gatherings with Friends and Family:   . Attends Religious Services:   . Active Member of Clubs or Organizations:   . Attends Archivist Meetings:   Marland Kitchen Marital Status:   Intimate Partner Violence:   . Fear of Current or Ex-Partner:   . Emotionally Abused:   Marland Kitchen Physically Abused:   . Sexually Abused:     Outpatient Medications Prior to Visit  Medication Sig Dispense Refill  . insulin detemir (LEVEMIR) 100 UNIT/ML injection Inject 0.3 mLs (30 Units total) into the skin daily. 10 mL 11  . Insulin Syringes, Disposable, U-100 0.3 ML MISC 100 Syringes by Does not apply route 3 (three) times daily with meals as needed. 100 each 6  . amLODipine (NORVASC) 5 MG tablet Take 1 tablet (5 mg total) by mouth daily. (Patient not taking: Reported on 10/13/2019) 180 tablet 3  . atorvastatin (LIPITOR) 20 MG tablet Take 1 tablet (20 mg total) by mouth daily. (Patient not taking: Reported on 10/13/2019) 90 tablet 3  . furosemide (LASIX) 20 MG tablet Take 1 tablet (20 mg total) by mouth daily for 5 days. 5 tablet 0  . glucose blood test strip Use to check fasting blood sugar once daily. 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 Insulin dependent (Patient not taking: Reported on 10/13/2019) 100 each 12  . insulin lispro (HUMALOG) 100 UNIT/ML injection Inject 20 units under skin, 2 times a day. (Patient not taking: Reported on 10/13/2019) 10 mL 3  . metFORMIN (GLUCOPHAGE) 1000 MG tablet Take 1 tablet (1,000 mg total) by mouth 2 (two) times daily with a meal. (Patient not taking: Reported on 09/20/2019) 180 tablet 3  . sildenafil (VIAGRA) 25 MG tablet Take 1 tablet (25 mg total) by mouth as needed for erectile dysfunction. for erectile dysfunction. (Patient not taking: Reported on  10/13/2019) 30 tablet 3   No facility-administered medications prior to visit.    Allergies  Allergen Reactions  . Sulfa Antibiotics Rash    Severe rash. Do not give sulfa medications  . Bactrim [Sulfamethoxazole-Trimethoprim]     Rash  . Cephalexin     Patient with severe drug reaction including exfoliating skin rash and hypotension after being prescribed both cephalexin and sulfamethoxazole-trimethoprim simultaneously. Favor SMX as much more likely culprit as patient had tolerated other cephalosporins in the past, but cannot say with complete certainty that this was not related to cephalexin.   . Other     Patient states he is allergic to the TB test    ROS Review of Systems  Constitutional: Negative.   HENT: Negative.   Eyes: Positive for visual disturbance (blurry).  Respiratory: Negative.   Cardiovascular: Negative.   Gastrointestinal:  Negative.   Endocrine: Negative.   Genitourinary: Negative.   Musculoskeletal: Negative.   Skin: Negative.   Allergic/Immunologic: Negative.   Neurological: Positive for dizziness (occasional ) and headaches (occasional ).  Hematological: Negative.   Psychiatric/Behavioral: Negative.    Objective:    Physical Exam  Constitutional: He is oriented to person, place, and time. He appears well-developed and well-nourished.  HENT:  Head: Normocephalic and atraumatic.  Eyes: Conjunctivae are normal.  Cardiovascular: Normal rate, regular rhythm, normal heart sounds and intact distal pulses.  Pulmonary/Chest: Effort normal and breath sounds normal.  Abdominal: Soft. Bowel sounds are normal.  Musculoskeletal:        General: Normal range of motion.     Cervical back: Normal range of motion and neck supple.  Neurological: He is alert and oriented to person, place, and time. He has normal reflexes.  Skin: Skin is warm and dry.  Psychiatric: He has a normal mood and affect. His behavior is normal. Judgment and thought content normal.  Nursing  note and vitals reviewed.   BP 129/83   Pulse (!) 103   Temp 98.8 F (37.1 C)   Ht 5\' 7"  (1.702 m)   Wt 143 lb 9.6 oz (65.1 kg)   SpO2 100%   BMI 22.49 kg/m  Wt Readings from Last 3 Encounters:  10/13/19 143 lb 9.6 oz (65.1 kg)  10/06/19 154 lb (69.9 kg)  09/28/19 144 lb (65.3 kg)     Health Maintenance Due  Topic Date Due  . OPHTHALMOLOGY EXAM  Never done  . COVID-19 Vaccine (1) Never done  . TETANUS/TDAP  Never done  . FOOT EXAM  08/20/2017  . URINE MICROALBUMIN  06/10/2018    There are no preventive care reminders to display for this patient.  Lab Results  Component Value Date   TSH 2.470 01/22/2019   Lab Results  Component Value Date   WBC 6.8 06/05/2019   HGB 10.6 (L) 06/05/2019   HCT 32.1 (L) 06/05/2019   MCV 82.9 06/05/2019   PLT 412 (H) 06/05/2019   Lab Results  Component Value Date   NA 141 10/06/2019   K 3.7 10/06/2019   CO2 28 06/05/2019   GLUCOSE 199 (H) 10/06/2019   BUN 21 10/06/2019   CREATININE 1.67 (H) 10/06/2019   BILITOT <0.2 10/06/2019   ALKPHOS 151 (H) 10/06/2019   AST 21 10/06/2019   ALT 7 07/27/2019   PROT 5.7 (L) 10/06/2019   ALBUMIN 3.2 (L) 10/06/2019   CALCIUM 8.5 (L) 10/06/2019   ANIONGAP 11 06/05/2019   Lab Results  Component Value Date   CHOL 176 07/27/2019   Lab Results  Component Value Date   HDL 44 07/27/2019   Lab Results  Component Value Date   LDLCALC 114 (H) 07/27/2019   Lab Results  Component Value Date   TRIG 100 07/27/2019   Lab Results  Component Value Date   CHOLHDL 4.0 07/27/2019   Lab Results  Component Value Date   HGBA1C 14.2 (A) 08/06/2019      Assessment & Plan:   1. Uncontrolled type 2 diabetes mellitus with peripheral neuropathy [facility]) Patient will begin to take his medications as prescribed. He will continue to decrease foods/beverages high in sugars and carbs and follow Heart Healthy or DASH diet. Increase physical activity to at least 30 minutes cardio exercise daily.  - POCT  glucose (manual entry) - POCT urinalysis dipstick - metFORMIN (GLUCOPHAGE) 1000 MG tablet; Take 1 tablet (1,000 mg total) by mouth 2 (  two) times daily with a meal.  Dispense: 180 tablet; Refill: 3  2. Hemoglobin A1C greater than 9%, indicating poor diabetic control Worsened. Most recent Hgb A1c increased at 14.2. Monitor.   3. Hyperglycemia - insulin lispro (HUMALOG) injection 20 Units  4. Elevated glucose  5. Essential hypertension The current medical regimen is effective; blood glucose is stable at 129/83 today; continue present plan and medications as prescribed. He will continue to take medications as prescribed, to decrease high sodium intake, excessive alcohol intake, increase potassium intake, smoking cessation, and increase physical activity of at least 30 minutes of cardio activity daily. He will continue to follow Heart Healthy or DASH diet.  6. Erectile dysfunction, unspecified erectile dysfunction type - sildenafil (VIAGRA) 25 MG tablet; Take 1 tablet (25 mg total) by mouth as needed for erectile dysfunction. for erectile dysfunction.  Dispense: 30 tablet; Refill: 3  7. Encounter for diabetic foot exam ([place]) Negative. Foot exam tolerated well. No areas of decreased sensitivity noted upon foot exam. Patient counseled on proper foot hygiene. He is encouraged to exam feet often (daily), using mirror if necessary; keep feet clean and dry (especially between toes), keep feet moistened, wear cotton socks, and avoid wearing open-toed shoes, high-heel shoes, and sandals. Patient verbalized understanding.   8. Follow up He will follow up in 3 months.   Meds ordered this encounter  Medications  . sildenafil (VIAGRA) 25 MG tablet    Sig: Take 1 tablet (25 mg total) by mouth as needed for erectile dysfunction. for erectile dysfunction.    Dispense:  30 tablet    Refill:  3  . metFORMIN (GLUCOPHAGE) 1000 MG tablet    Sig: Take 1 tablet (1,000 mg total) by mouth 2 (two) times daily with  a meal.    Dispense:  180 tablet    Refill:  3  . insulin lispro (HUMALOG) injection 20 Units    Orders Placed This Encounter  Procedures  . POCT glucose (manual entry)  . POCT urinalysis dipstick    Referral Orders  No referral(s) requested today    Kathe Becton,  MSN, FNP-BC [facility] [place] [address] [place], [place] [address] [phone] fax  Problem List Items Addressed This Visit      Cardiovascular and Mediastinum   Essential hypertension   Relevant Medications   sildenafil (VIAGRA) 25 MG tablet     Endocrine   Uncontrolled type 2 diabetes mellitus with peripheral neuropathy ([place]) - Primary   Relevant Medications   metFORMIN (GLUCOPHAGE) 1000 MG tablet   Other Relevant Orders   POCT glucose (manual entry) (Completed)   POCT urinalysis dipstick     Other   Elevated glucose    Other Visit Diagnoses    Hemoglobin A1C greater than 9%, indicating poor diabetic control       Relevant Medications   metFORMIN (GLUCOPHAGE) 1000 MG tablet   insulin lispro (HUMALOG) injection 20 Units (Completed)   Hyperglycemia       Relevant Medications   insulin lispro (HUMALOG) injection 20 Units (Completed)   Erectile dysfunction, unspecified erectile dysfunction type       Relevant Medications   sildenafil (VIAGRA) 25 MG tablet   Encounter for diabetic foot exam ([place])       Relevant Medications   metFORMIN (GLUCOPHAGE) 1000 MG tablet   insulin lispro (HUMALOG) injection 20 Units (Completed)   Follow up  Meds ordered this encounter  Medications  . sildenafil (VIAGRA) 25 MG tablet    Sig: Take 1 tablet (25 mg total) by mouth as needed for erectile dysfunction. for erectile dysfunction.    Dispense:  30 tablet    Refill:  3  . metFORMIN (GLUCOPHAGE) 1000 MG tablet    Sig: Take 1 tablet (1,000 mg total) by mouth 2 (two) times daily with a meal.    Dispense:  180 tablet     Refill:  3  . insulin lispro (HUMALOG) injection 20 Units    Follow-up: Return in about 1 week (around 10/20/2019).    [name], FNP

## 2019-10-19 DIAGNOSIS — K130 Diseases of lips: Secondary | ICD-10-CM

## 2019-10-19 HISTORY — DX: Diseases of lips: K13.0

## 2019-10-20 ENCOUNTER — Other Ambulatory Visit: Payer: Self-pay

## 2019-10-20 ENCOUNTER — Ambulatory Visit: Payer: Self-pay

## 2019-10-20 DIAGNOSIS — I10 Essential (primary) hypertension: Secondary | ICD-10-CM

## 2019-10-20 DIAGNOSIS — R7309 Other abnormal glucose: Secondary | ICD-10-CM

## 2019-10-20 NOTE — Progress Notes (Signed)
BP 156/93,  157/90. CBG 166. Okay per Lanelle Bal NP. Patient to follow up in 2 weeks.

## 2019-10-22 ENCOUNTER — Ambulatory Visit: Payer: Self-pay | Admitting: Family Medicine

## 2019-11-05 ENCOUNTER — Encounter (HOSPITAL_COMMUNITY): Payer: Self-pay | Admitting: Emergency Medicine

## 2019-11-05 ENCOUNTER — Ambulatory Visit (HOSPITAL_COMMUNITY)
Admission: EM | Admit: 2019-11-05 | Discharge: 2019-11-05 | Disposition: A | Discharge status: Home / Self Care | Payer: Self-pay | Attending: Internal Medicine | Admitting: Internal Medicine

## 2019-11-05 ENCOUNTER — Other Ambulatory Visit: Payer: Self-pay

## 2019-11-05 ENCOUNTER — Encounter: Payer: Self-pay | Admitting: Family Medicine

## 2019-11-05 ENCOUNTER — Ambulatory Visit (INDEPENDENT_AMBULATORY_CARE_PROVIDER_SITE_OTHER): Payer: Self-pay | Admitting: Family Medicine

## 2019-11-05 VITALS — BP 163/93 | HR 107 | Temp 98.0°F | Wt 142.0 lb

## 2019-11-05 DIAGNOSIS — Z09 Encounter for follow-up examination after completed treatment for conditions other than malignant neoplasm: Secondary | ICD-10-CM

## 2019-11-05 DIAGNOSIS — I10 Essential (primary) hypertension: Secondary | ICD-10-CM

## 2019-11-05 DIAGNOSIS — R739 Hyperglycemia, unspecified: Secondary | ICD-10-CM

## 2019-11-05 DIAGNOSIS — Z91199 Patient's noncompliance with other medical treatment and regimen due to unspecified reason: Secondary | ICD-10-CM

## 2019-11-05 DIAGNOSIS — K130 Diseases of lips: Secondary | ICD-10-CM

## 2019-11-05 DIAGNOSIS — S86899A Other injury of other muscle(s) and tendon(s) at lower leg level, unspecified leg, initial encounter: Secondary | ICD-10-CM

## 2019-11-05 DIAGNOSIS — F419 Anxiety disorder, unspecified: Secondary | ICD-10-CM

## 2019-11-05 DIAGNOSIS — R600 Localized edema: Secondary | ICD-10-CM

## 2019-11-05 DIAGNOSIS — I160 Hypertensive urgency: Secondary | ICD-10-CM

## 2019-11-05 DIAGNOSIS — E1142 Type 2 diabetes mellitus with diabetic polyneuropathy: Secondary | ICD-10-CM

## 2019-11-05 DIAGNOSIS — IMO0002 Reserved for concepts with insufficient information to code with codable children: Secondary | ICD-10-CM

## 2019-11-05 DIAGNOSIS — Z9119 Patient's noncompliance with other medical treatment and regimen: Secondary | ICD-10-CM

## 2019-11-05 DIAGNOSIS — R7309 Other abnormal glucose: Secondary | ICD-10-CM

## 2019-11-05 DIAGNOSIS — E1165 Type 2 diabetes mellitus with hyperglycemia: Secondary | ICD-10-CM

## 2019-11-05 LAB — POCT GLYCOSYLATED HEMOGLOBIN (HGB A1C)
HbA1c POC (<> result, manual entry): 13.4 % (ref 4.0–5.6)
HbA1c, POC (controlled diabetic range): 13.4 % — AB (ref 0.0–7.0)
HbA1c, POC (prediabetic range): 13.4 % — AB (ref 5.7–6.4)
Hemoglobin A1C: 13.4 % — AB (ref 4.0–5.6)

## 2019-11-05 LAB — GLUCOSE, POCT (MANUAL RESULT ENTRY): POC Glucose: 509 mg/dl — AB (ref 70–99)

## 2019-11-05 MED ORDER — BUSPIRONE HCL 10 MG PO TABS: 10.0000 mg | ORAL_TABLET | Freq: Two times a day (BID) | ORAL | 3 refills | Status: DC

## 2019-11-05 MED ORDER — SITAGLIPTIN PHOSPHATE 25 MG PO TABS: 25.0000 mg | ORAL_TABLET | Freq: Every day | ORAL | 6 refills | Status: DC

## 2019-11-05 MED ORDER — FUROSEMIDE 20 MG PO TABS: 20.0000 mg | ORAL_TABLET | Freq: Every day | ORAL | 0 refills | Status: DC

## 2019-11-05 MED ORDER — KETOROLAC TROMETHAMINE 60 MG/2ML IM SOLN
INTRAMUSCULAR | Status: AC
  Filled 2019-11-05: qty 2

## 2019-11-05 MED ORDER — KETOROLAC TROMETHAMINE 60 MG/2ML IM SOLN
60.0000 mg | Freq: Once | INTRAMUSCULAR | Status: AC
  Administered 2019-11-05: 60 mg via INTRAMUSCULAR

## 2019-11-05 MED ORDER — AMLODIPINE BESYLATE 5 MG PO TABS: 5.0000 mg | ORAL_TABLET | Freq: Every day | ORAL | 3 refills | Status: DC

## 2019-11-05 MED FILL — AMLODIPINE BESYLATE 5 MG TA: 5 | 30 days supply | Qty: 30 | Fill #0

## 2019-11-05 MED FILL — busPIRone HCL 10 MG TABS: 10 | 30 days supply | Qty: 60 | Fill #0

## 2019-11-05 MED FILL — FUROSEMIDE 20 MG TABS: 20 | 5 days supply | Qty: 5 | Fill #0

## 2019-11-05 MED FILL — JANUVIA 25 MG TABLET: 25 | 30 days supply | Qty: 30 | Fill #0

## 2019-11-05 NOTE — ED Triage Notes (Signed)
Pt here for bilateral leg pain after walking home today from work; pt denies obvious injury

## 2019-11-05 NOTE — ED Provider Notes (Signed)
[place]    CSN: [id number] Arrival date & time: 11/05/19  1956    History   Chief Complaint Chief Complaint  Patient presents with  . Leg Pain    HPI [name] is a 49 y.o. male with past medical history of DM2, hypertension, anxiety presents to urgent care with complaints of bilateral leg pain.  Patient states he has had to walk home 45 minutes from work on Sunday and again today and began experiencing pain anterior to both shins.  Patient denies any trauma to the area, no difficulty with ambulation, no numbness or tingling.  Patient has not tried anything for discomfort   Past Medical History:  Diagnosis Date  . Anxiety 02/2019  . Anxiety 10/2019  . Diabetes mellitus type II, uncontrolled ([place]) 07/17/2006  . Elevated alkaline phosphatase level 01/2019  . ESSENTIAL HYPERTENSION 07/17/2006  . HYPERLIPIDEMIA 11/24/2008  . Insomnia 02/2019  . Left bundle branch block 02/22/2019  . Lesion of lip 10/2019  . Pneumonia 10/22/2011  . Suicidal ideations 02/2019  . Vitamin D deficiency 01/2019    Patient Active Problem List   Diagnosis Date Noted  . Elevated brain natriuretic peptide (BNP) level 10/07/2019  . Non compliance with medical treatment 08/06/2019  . Left ventricular dysfunction 04/27/2019  . Hypertensive urgency 04/07/2019  . Chest pain 04/07/2019  . Dyspnea on exertion 03/04/2019  . Elevated glucose 07/29/2018  . Muscle spasms of both lower extremities 07/29/2018  . Skin rash 07/29/2018  . 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 Healthcare maintenance 07/02/2016  . New onset left bundle branch block (LBBB)   . 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 Hyperlipidemia LDL goal <70 11/24/2008  . Uncontrolled type 2 diabetes mellitus with peripheral neuropathy ([place]) 07/17/2006  . Essential hypertension 07/17/2006    Past Surgical History:  Procedure Laterality Date  . ARM SURGERY         Home Medications    Prior to  Admission medications   Medication Sig Start Date End Date Taking? Authorizing Provider  amLODipine (NORVASC) 5 MG tablet Take 1 tablet (5 mg total) by mouth daily. 11/05/19 10/25/21  [name], FNP  atorvastatin (LIPITOR) 20 MG tablet Take 1 tablet (20 mg total) by mouth daily. 08/06/19   [name], FNP  busPIRone (BUSPAR) 10 MG tablet Take 1 tablet (10 mg total) by mouth 2 (two) times daily. 11/05/19   [name], FNP  furosemide (LASIX) 20 MG tablet Take 1 tablet (20 mg total) by mouth daily for 5 days. 11/05/19 11/10/19  [name], FNP  glucose blood test strip Use to check fasting blood sugar once daily. 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 Insulin dependent 06/10/17   Gildardo Pounds, NP  insulin detemir (LEVEMIR) 100 UNIT/ML injection Inject 0.3 mLs (30 Units total) into the skin daily. 08/06/19   [name], FNP  Insulin Syringes, Disposable, U-100 0.3 ML MISC 100 Syringes by Does not apply route 3 (three) times daily with meals as needed. 07/29/18   [name], FNP  metFORMIN (GLUCOPHAGE) 1000 MG tablet Take 1 tablet (1,000 mg total) by mouth 2 (two) times daily with a meal. Patient not taking: Reported on 11/05/2019 10/13/19   [name], FNP  sildenafil (VIAGRA) 25 MG tablet Take 1 tablet (25 mg total) by mouth as needed for erectile dysfunction. for erectile dysfunction. 10/13/19   [name], FNP  sitaGLIPtin (JANUVIA) 25 MG tablet Take 1 tablet (25 mg total)  by mouth daily. 11/05/19   [name], FNP  carvedilol (COREG) 6.25 MG tablet Take 1 tablet (6.25 mg total) by mouth 2 (two) times daily. Patient not taking: Reported on 07/27/2019 04/27/19 08/30/19  Lorretta Harp, MD  gabapentin (NEURONTIN) 300 MG capsule Take 1 capsule (300 mg total) by mouth 3 (three) times daily. Patient not taking: Reported on 07/27/2019 07/29/18 08/30/19  [name], FNP    Family History Family History  Problem Relation Age of Onset  . Hypertension Mother 5  .  Cancer Mother        bladder cancer  . Alcohol abuse Father   . Hypertension Brother     Social History Social History   Tobacco Use  . Smoking status: Never Smoker  . Smokeless tobacco: Never Used  Vaping Use  . Vaping Use: Never used  Substance Use Topics  . Alcohol use: No    Alcohol/week: 0.0 standard drinks  . Drug use: No     Allergies   Sulfa antibiotics, Bactrim [sulfamethoxazole-trimethoprim], Cephalexin, and Other   Review of Systems As stated in hpi, otherwise negative   Physical Exam Triage Vital Signs ED Triage Vitals  Enc Vitals Group     BP 11/05/19 2014 135/86     Pulse Rate 11/05/19 2014 (!) 106     Resp 11/05/19 2014 18     Temp 11/05/19 2014 100 F (37.8 C)     Temp Source 11/05/19 2014 Oral     SpO2 11/05/19 2014 96 %     Weight --      Height --      Head Circumference --      Peak Flow --      Pain Score 11/05/19 2015 6     Pain Loc --      Pain Edu? --      Excl. in [place]? --    No data found.  Updated Vital Signs BP 135/86 (BP Location: Right Arm)   Pulse (!) 106   Temp 100 F (37.8 C) (Oral)   Resp 18   SpO2 96%     Physical Exam Constitutional:      Appearance: Normal appearance.  Musculoskeletal:        General: Tenderness present. No swelling, deformity or signs of injury. Normal range of motion.     Right lower leg: No edema.     Left lower leg: No edema.     Comments: Mild TTP upon palpation of anterior tibial area. No swelling, bruising or erythema. 2+DP pulses  Skin:    General: Skin is warm and dry.  Neurological:     Mental Status: He is alert.      UC Treatments / Results  Labs (all labs ordered are listed, but only abnormal results are displayed) Labs Reviewed - No data to display  EKG   Radiology No results found.  Procedures Procedures (including critical care time)  Medications Ordered in UC Medications  ketorolac (TORADOL) injection 60 mg (60 mg Intramuscular Given 11/05/19 2031)     Initial Impression / Assessment and Plan / UC Course  I have reviewed the triage vital signs and the nursing notes.  Pertinent labs & imaging results that were available during my care of the patient were reviewed by me and considered in my medical decision making (see chart for details).  Shin splints -d/t recent prolonged walking -IM toradol in office, but avoid NSAIDS long term given pmh -tylenol prn, ice  Final Clinical Impressions(s) / UC Diagnoses   Final diagnoses:  Anterior shin splints     Discharge Instructions     Take Tylenol 650 mg every 6 hours as needed for pain.  Ice to shins as needed for pain.  Wear supportive shoes when walking.    ED Prescriptions    None     PDMP not reviewed this encounter.   Rudolpho Sevin, NP 11/06/19 2113

## 2019-11-05 NOTE — Discharge Instructions (Addendum)
Take Tylenol 650 mg every 6 hours as needed for pain.  Ice to shins as needed for pain.  Wear supportive shoes when walking.

## 2019-11-05 NOTE — Progress Notes (Signed)
[name]   Established Patient Office Visit  Subjective:  Patient ID: [name], male    DOB: 1970/05/26  Age: 49 y.o. MRN: [id number]  CC:  Chief Complaint  Patient presents with  . Follow-up    bumps on bottom lip and chin; stopped taking Metformin due to recall   . Depression    Since last week, no thoughts of self harm    HPI [name] is a 49 year old male who presents for Follow Up today.    Patient Active Problem List   Diagnosis Date Noted  . Elevated brain natriuretic peptide (BNP) level 10/07/2019  . Non compliance with medical treatment 08/06/2019  . Left ventricular dysfunction 04/27/2019  . Hypertensive urgency 04/07/2019  . Chest pain 04/07/2019  . Dyspnea on exertion 03/04/2019  . Elevated glucose 07/29/2018  . Muscle spasms of both lower extremities 07/29/2018  . Skin rash 07/29/2018  . 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 Healthcare maintenance 07/02/2016  . New onset left bundle branch block (LBBB)   . 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 Hyperlipidemia LDL goal <70 11/24/2008  . Uncontrolled type 2 diabetes mellitus with peripheral neuropathy ([place]) 07/17/2006  . Essential hypertension 07/17/2006    Past Medical History:  Diagnosis Date  . Anxiety 02/2019  . Anxiety 10/2019  . Diabetes mellitus type II, uncontrolled ([place]) 07/17/2006  . Elevated alkaline phosphatase level 01/2019  . ESSENTIAL HYPERTENSION 07/17/2006  . HYPERLIPIDEMIA 11/24/2008  . Insomnia 02/2019  . Left bundle branch block 02/22/2019  . Lesion of lip 10/2019  . Pneumonia 10/22/2011  . Suicidal ideations 02/2019  . Vitamin D deficiency 01/2019   Current Status: Since his last office visit, he is doing well with no complaints. His blood glucose levels have been elevated lately, r/t his not taking anti-diabetes or hypertensive medications as prescribed;he is not eating regularly. He has seen low  range of 79 and high of 420 since his last office visit. He denies fatigue, frequent urination, blurred vision, excessive hunger, excessive thirst, weight gain, weight loss, and poor wound healing. He continues to check his feet regularly. His anxiety is moderate today, r/t his girlfriend. He denies suicidal ideations, homicidal ideations, or auditory hallucinations.He reports lip lesions, with pain and discomfort. He denies fevers, chills, recent infections, weight loss, and night sweats. He has not had any headaches, visual changes, dizziness, and falls. No chest pain, heart palpitations, cough and shortness of breath reported. Denies GI problems such as nausea, vomiting, diarrhea, and constipation. He has no reports of blood in stools, dysuria and hematuria. He is taking all medications as prescribed. He denies pain today.   Past Surgical History:  Procedure Laterality Date  . ARM SURGERY      Family History  Problem Relation Age of Onset  . Hypertension Mother 79  . Cancer Mother        bladder cancer  . Alcohol abuse Father   . Hypertension Brother     Social History   Socioeconomic History  . Marital status: Legally Separated    Spouse name: Not on file  . Number of children: Not on file  . Years of education: Not on file  . Highest education level: Not on file  Occupational History  . 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 Occupation: Biochemist, clinical    Comment: Updated June 2013  . Occupation: Engineer, civil (consulting)    Comment: Began  Jan 2014  Tobacco Use  . Smoking status: Never Smoker  . Smokeless tobacco: Never Used  Vaping Use  . Vaping Use: Never used  Substance and Sexual Activity  . Alcohol use: No    Alcohol/week: 0.0 standard drinks  . Drug use: No  . Sexual activity: Yes    Partners: Female  Other Topics Concern  . Not on file  Social History Narrative   Lives with his mother and brother.   Wife has had a foot amputation; is a type 1 DM.   Social  Determinants of Health   Financial Resource Strain:   . Difficulty of Paying Living Expenses:   Food Insecurity:   . Worried About Charity fundraiser in the Last Year:   . Arboriculturist in the Last Year:   Transportation Needs:   . Film/video editor (Medical):   Marland Kitchen Lack of Transportation (Non-Medical):   Physical Activity:   . Days of Exercise per Week:   . Minutes of Exercise per Session:   Stress:   . Feeling of Stress :   Social Connections:   . Frequency of Communication with Friends and Family:   . Frequency of Social Gatherings with Friends and Family:   . Attends Religious Services:   . Active Member of Clubs or Organizations:   . Attends Archivist Meetings:   Marland Kitchen Marital Status:   Intimate Partner Violence:   . Fear of Current or Ex-Partner:   . Emotionally Abused:   Marland Kitchen Physically Abused:   . Sexually Abused:     Outpatient Medications Prior to Visit  Medication Sig Dispense Refill  . atorvastatin (LIPITOR) 20 MG tablet Take 1 tablet (20 mg total) by mouth daily. 90 tablet 3  . glucose blood test strip Use to check fasting blood sugar once daily. 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 Insulin dependent 100 each 12  . sildenafil (VIAGRA) 25 MG tablet Take 1 tablet (25 mg total) by mouth as needed for erectile dysfunction. for erectile dysfunction. 30 tablet 3  . insulin detemir (LEVEMIR) 100 UNIT/ML injection Inject 0.3 mLs (30 Units total) into the skin daily. 10 mL 11  . Insulin Syringes, Disposable, U-100 0.3 ML MISC 100 Syringes by Does not apply route 3 (three) times daily with meals as needed. 100 each 6  . metFORMIN (GLUCOPHAGE) 1000 MG tablet Take 1 tablet (1,000 mg total) by mouth 2 (two) times daily with a meal. (Patient not taking: Reported on 11/05/2019) 180 tablet 3  . amLODipine (NORVASC) 5 MG tablet Take 1 tablet (5 mg total) by mouth daily. (Patient not taking: Reported on 10/13/2019) 180 tablet 3  . furosemide (LASIX) 20 MG tablet Take 1 tablet (20 mg total) by  mouth daily for 5 days. 5 tablet 0   No facility-administered medications prior to visit.    Allergies  Allergen Reactions  . Sulfa Antibiotics Rash    Severe rash. Do not give sulfa medications  . Bactrim [Sulfamethoxazole-Trimethoprim]     Rash  . Cephalexin     Patient with severe drug reaction including exfoliating skin rash and hypotension after being prescribed both cephalexin and sulfamethoxazole-trimethoprim simultaneously. Favor SMX as much more likely culprit as patient had tolerated other cephalosporins in the past, but cannot say with complete certainty that this was not related to cephalexin.   . Other     Patient states he is allergic to the TB test    ROS Review of Systems  Constitutional: Negative.  HENT: Negative.   Eyes: Positive for visual disturbance (blurry vision).  Respiratory: Negative.   Cardiovascular: Negative.   Gastrointestinal: Negative.   Endocrine: Negative.   Genitourinary: Negative.   Musculoskeletal: Negative.   Skin: Negative.   Allergic/Immunologic: Negative.   Neurological: Positive for dizziness (occasional ) and headaches (occasional ).  Hematological: Negative.   Psychiatric/Behavioral: Positive for agitation. The patient is nervous/anxious.    Objective:    Physical Exam Vitals and nursing note reviewed.  Constitutional:      Appearance: Normal appearance.  HENT:     Head: Normocephalic and atraumatic.      Nose: Nose normal.     Mouth/Throat:     Mouth: Mucous membranes are moist.  Cardiovascular:     Rate and Rhythm: Normal rate and regular rhythm.     Pulses: Normal pulses.     Heart sounds: Normal heart sounds.  Pulmonary:     Effort: Pulmonary effort is normal.     Breath sounds: Normal breath sounds.  Abdominal:     General: Abdomen is flat. Bowel sounds are normal.  Musculoskeletal:        General: Normal range of motion.     Cervical back: Normal range of motion and neck supple.  Skin:    General: Skin is  warm and dry.  Neurological:     General: No focal deficit present.     Mental Status: He is alert and oriented to person, place, and time.  Psychiatric:        Mood and Affect: Mood normal.        Thought Content: Thought content normal.        Judgment: Judgment normal.    BP (!) 163/93 (BP Location: Right Arm, Patient Position: Sitting, Cuff Size: Small)   Pulse (!) 107   Temp 98 F (36.7 C)   Wt 142 lb 0.2 oz (64.4 kg)   SpO2 99%   BMI 22.24 kg/m  Wt Readings from Last 3 Encounters:  11/05/19 142 lb 0.2 oz (64.4 kg)  10/13/19 143 lb 9.6 oz (65.1 kg)  10/06/19 154 lb (69.9 kg)     Health Maintenance Due  Topic Date Due  . OPHTHALMOLOGY EXAM  Never done  . COVID-19 Vaccine (1) Never done  . TETANUS/TDAP  Never done  . FOOT EXAM  08/20/2017  . URINE MICROALBUMIN  06/10/2018    There are no preventive care reminders to display for this patient.  Lab Results  Component Value Date   TSH 2.470 01/22/2019   Lab Results  Component Value Date   WBC 6.8 06/05/2019   HGB 10.6 (L) 06/05/2019   HCT 32.1 (L) 06/05/2019   MCV 82.9 06/05/2019   PLT 412 (H) 06/05/2019   Lab Results  Component Value Date   NA 141 10/06/2019   K 3.7 10/06/2019   CO2 28 06/05/2019   GLUCOSE 199 (H) 10/06/2019   BUN 21 10/06/2019   CREATININE 1.67 (H) 10/06/2019   BILITOT <0.2 10/06/2019   ALKPHOS 151 (H) 10/06/2019   AST 21 10/06/2019   ALT 7 07/27/2019   PROT 5.7 (L) 10/06/2019   ALBUMIN 3.2 (L) 10/06/2019   CALCIUM 8.5 (L) 10/06/2019   ANIONGAP 11 06/05/2019   Lab Results  Component Value Date   CHOL 176 07/27/2019   Lab Results  Component Value Date   HDL 44 07/27/2019   Lab Results  Component Value Date   LDLCALC 114 (H) 07/27/2019   Lab Results  Component Value  Date   TRIG 100 07/27/2019   Lab Results  Component Value Date   CHOLHDL 4.0 07/27/2019   Lab Results  Component Value Date   HGBA1C 13.4 (A) 11/05/2019   HGBA1C 13.4 11/05/2019   HGBA1C 13.4 (A)  11/05/2019   HGBA1C 13.4 (A) 11/05/2019    Assessment & Plan:   1. Uncontrolled type 2 diabetes mellitus with peripheral neuropathy ([place]) He will continue medication as prescribed, to decrease foods/beverages high in sugars and carbs and follow Heart Healthy or DASH diet. Increase physical activity to at least 30 minutes cardio exercise daily.  - sitaGLIPtin (JANUVIA) 25 MG tablet; Take 1 tablet (25 mg total) by mouth daily.  Dispense: 30 tablet; Refill: 6 - Glucose (CBG) - HgB A1c  2. Hemoglobin A1C greater than 9%, indicating poor diabetic control Hgb A1c mildly decreased at 13.4 today, from 14.2 on 08/06/2019. - sitaGLIPtin (JANUVIA) 25 MG tablet; Take 1 tablet (25 mg total) by mouth daily.  Dispense: 30 tablet; Refill: 6 - Glucose (CBG) - HgB A1c  3. Hyperglycemia - sitaGLIPtin (JANUVIA) 25 MG tablet; Take 1 tablet (25 mg total) by mouth daily.  Dispense: 30 tablet; Refill: 6  4. Hypertensive urgency Blood pressures are elevated today. He will re-begin taking hypertensive mediations as prescribed. He denies severe headaches, confusion, seizures, double vision, and blurred vision, nausea and vomiting. He will report to ED if he experiences these symptoms. Patient verbalized understanding.    5. Essential hypertension  6. Anxiety - busPIRone (BUSPAR) 10 MG tablet; Take 1 tablet (10 mg total) by mouth 2 (two) times daily.  Dispense: 60 tablet; Refill: 3  7. Edema of both lower extremities - furosemide (LASIX) 20 MG tablet; Take 1 tablet (20 mg total) by mouth daily for 5 days.  Dispense: 5 tablet; Refill: 0  8. Lesion of lip - STD Screen (8)  9. Non compliance with medical treatment  10. Follow up He will follow up in 3 months.   Meds ordered this encounter  Medications  . sitaGLIPtin (JANUVIA) 25 MG tablet    Sig: Take 1 tablet (25 mg total) by mouth daily.    Dispense:  30 tablet    Refill:  6  . busPIRone (BUSPAR) 10 MG tablet    Sig: Take 1 tablet (10 mg total) by  mouth 2 (two) times daily.    Dispense:  60 tablet    Refill:  3  . furosemide (LASIX) 20 MG tablet    Sig: Take 1 tablet (20 mg total) by mouth daily for 5 days.    Dispense:  5 tablet    Refill:  0  . amLODipine (NORVASC) 5 MG tablet    Sig: Take 1 tablet (5 mg total) by mouth daily.    Dispense:  180 tablet    Refill:  3   Orders Placed This Encounter  Procedures  . STD Screen (8)  . Glucose (CBG)  . HgB A1c    Referral Orders  No referral(s) requested today    Kathe Becton,  MSN, FNP-BC [facility] [address] [place], [place] [address] [phone] fax   Problem List Items Addressed This Visit      Cardiovascular and Mediastinum   Essential hypertension   Relevant Medications   furosemide (LASIX) 20 MG tablet   amLODipine (NORVASC) 5 MG tablet     Endocrine   Uncontrolled type 2 diabetes mellitus with peripheral neuropathy ([place]) -  Primary   Relevant Medications   sitaGLIPtin (JANUVIA) 25 MG tablet   busPIRone (BUSPAR) 10 MG tablet   Other Relevant Orders   Glucose (CBG) (Completed)   HgB A1c (Completed)     Other   Non compliance with medical treatment    Other Visit Diagnoses    Hemoglobin A1C greater than 9%, indicating poor diabetic control       Relevant Medications   sitaGLIPtin (JANUVIA) 25 MG tablet   Other Relevant Orders   Glucose (CBG) (Completed)   HgB A1c (Completed)   Hyperglycemia       Relevant Medications   sitaGLIPtin (JANUVIA) 25 MG tablet   Anxiety       Relevant Medications   busPIRone (BUSPAR) 10 MG tablet   Edema of both lower extremities       R >L    Relevant Medications   furosemide (LASIX) 20 MG tablet   Lesion of lip       Relevant Orders   STD Screen (8) (Completed)   Follow up          Meds ordered this encounter  Medications  . sitaGLIPtin (JANUVIA) 25 MG tablet    Sig: Take 1 tablet (25 mg total) by  mouth daily.    Dispense:  30 tablet    Refill:  6  . busPIRone (BUSPAR) 10 MG tablet    Sig: Take 1 tablet (10 mg total) by mouth 2 (two) times daily.    Dispense:  60 tablet    Refill:  3  . furosemide (LASIX) 20 MG tablet    Sig: Take 1 tablet (20 mg total) by mouth daily for 5 days.    Dispense:  5 tablet    Refill:  0  . amLODipine (NORVASC) 5 MG tablet    Sig: Take 1 tablet (5 mg total) by mouth daily.    Dispense:  180 tablet    Refill:  3    Follow-up: No follow-ups on file.    [name], FNP

## 2019-11-05 NOTE — Patient Instructions (Signed)
Buspirone tablets What is this medicine? BUSPIRONE (byoo SPYE rone) is used to treat anxiety disorders. This medicine may be used for other purposes; ask your health care provider or pharmacist if you have questions. COMMON BRAND NAME(S): BuSpar What should I tell my health care provider before I take this medicine? They need to know if you have any of these conditions:  kidney or liver disease  an unusual or allergic reaction to buspirone, other medicines, foods, dyes, or preservatives  pregnant or trying to get pregnant  breast-feeding How should I use this medicine? Take this medicine by mouth with a glass of water. Follow the directions on the prescription label. You may take this medicine with or without food. To ensure that this medicine always works the same way for you, you should take it either always with or always without food. Take your doses at regular intervals. Do not take your medicine more often than directed. Do not stop taking except on the advice of your doctor or health care professional. Talk to your pediatrician regarding the use of this medicine in children. Special care may be needed. Overdosage: If you think you have taken too much of this medicine contact a poison control center or emergency room at once. NOTE: This medicine is only for you. Do not share this medicine with others. What if I miss a dose? If you miss a dose, take it as soon as you can. If it is almost time for your next dose, take only that dose. Do not take double or extra doses. What may interact with this medicine? Do not take this medicine with any of the following medications:  linezolid  MAOIs like Carbex, Eldepryl, Marplan, Nardil, and Parnate  methylene blue  procarbazine This medicine may also interact with the following medications:  diazepam  digoxin  diltiazem  erythromycin  grapefruit juice  haloperidol  medicines for mental depression or mood problems  medicines  for seizures like carbamazepine, phenobarbital and phenytoin  nefazodone  other medications for anxiety  rifampin  ritonavir  some antifungal medicines like itraconazole, ketoconazole, and voriconazole  verapamil  warfarin This list may not describe all possible interactions. Give your health care provider a list of all the medicines, herbs, non-prescription drugs, or dietary supplements you use. Also tell them if you smoke, drink alcohol, or use illegal drugs. Some items may interact with your medicine. What should I watch for while using this medicine? Visit your doctor or health care professional for regular checks on your progress. It may take 1 to 2 weeks before your anxiety gets better. You may get drowsy or dizzy. Do not drive, use machinery, or do anything that needs mental alertness until you know how this drug affects you. Do not stand or sit up quickly, especially if you are an older patient. This reduces the risk of dizzy or fainting spells. Alcohol can make you more drowsy and dizzy. Avoid alcoholic drinks. What side effects may I notice from receiving this medicine? Side effects that you should report to your doctor or health care professional as soon as possible:  blurred vision or other vision changes  chest pain  confusion  difficulty breathing  feelings of hostility or anger  muscle aches and pains  numbness or tingling in hands or feet  ringing in the ears  skin rash and itching  vomiting  weakness Side effects that usually do not require medical attention (report to your doctor or health care professional if they continue or   are bothersome):  disturbed dreams, nightmares  headache  nausea  restlessness or nervousness  sore throat and nasal congestion  stomach upset This list may not describe all possible side effects. Call your doctor for medical advice about side effects. You may report side effects to FDA at 1-800-FDA-1088. Where should I  keep my medicine? Keep out of the reach of children. Store at room temperature below 30 degrees C (86 degrees F). Protect from light. Keep container tightly closed. Throw away any unused medicine after the expiration date. NOTE: This sheet is a summary. It may not cover all possible information. If you have questions about this medicine, talk to your doctor, pharmacist, or health care provider.  2020 Elsevier/Gold Standard (2009-12-14 18:06:11) Sitagliptin oral tablet What is this medicine? SITAGLIPTIN (sit a GLIP tin) helps to treat type 2 diabetes. It helps to control blood sugar. Treatment is combined with diet and exercise. This medicine may be used for other purposes; ask your health care provider or pharmacist if you have questions. COMMON BRAND NAME(S): Januvia What should I tell my health care provider before I take this medicine? They need to know if you have any of these conditions:  diabetic ketoacidosis  kidney disease  pancreatitis  previous swelling of the tongue, face, or lips with difficulty breathing, difficulty swallowing, hoarseness, or tightening of the throat  type 1 diabetes  an unusual or allergic reaction to sitagliptin, other medicines, foods, dyes, or preservatives  pregnant or trying to get pregnant  breast-feeding How should I use this medicine? Take this medicine by mouth with a glass of water. Follow the directions on the prescription label. You can take it with or without food. Do not cut, crush or chew this medicine. Take your dose at the same time each day. Do not take more often than directed. Do not stop taking except on your doctor's advice. A special MedGuide will be given to you by the pharmacist with each prescription and refill. Be sure to read this information carefully each time. Talk to your pediatrician regarding the use of this medicine in children. Special care may be needed. Overdosage: If you think you have taken too much of this  medicine contact a poison control center or emergency room at once. NOTE: This medicine is only for you. Do not share this medicine with others. What if I miss a dose? If you miss a dose, take it as soon as you can. If it is almost time for your next dose, take only that dose. Do not take double or extra doses. What may interact with this medicine? Do not take this medicine with any of the following medications:  gatifloxacin This medicine may also interact with the following medications:  alcohol  digoxin  insulin  sulfonylureas like glimepiride, glipizide, glyburide This list may not describe all possible interactions. Give your health care provider a list of all the medicines, herbs, non-prescription drugs, or dietary supplements you use. Also tell them if you smoke, drink alcohol, or use illegal drugs. Some items may interact with your medicine. What should I watch for while using this medicine? Visit your doctor or health care professional for regular checks on your progress. A test called the HbA1C (A1C) will be monitored. This is a simple blood test. It measures your blood sugar control over the last 2 to 3 months. You will receive this test every 3 to 6 months. Learn how to check your blood sugar. Learn the symptoms of low and high  blood sugar and how to manage them. Always carry a quick-source of sugar with you in case you have symptoms of low blood sugar. Examples include hard sugar candy or glucose tablets. Make sure others know that you can choke if you eat or drink when you develop serious symptoms of low blood sugar, such as seizures or unconsciousness. They must get medical help at once. Tell your doctor or health care professional if you have high blood sugar. You might need to change the dose of your medicine. If you are sick or exercising more than usual, you might need to change the dose of your medicine. Do not skip meals. Ask your doctor or health care professional if you  should avoid alcohol. Many nonprescription cough and cold products contain sugar or alcohol. These can affect blood sugar. Wear a medical ID bracelet or chain, and carry a card that describes your disease and details of your medicine and dosage times. What side effects may I notice from receiving this medicine? Side effects that you should report to your doctor or health care professional as soon as possible:  allergic reactions like skin rash, itching or hives, swelling of the face, lips, or tongue  breathing problems  general ill feeling or flu-like symptoms  joint pain  loss of appetite  redness, blistering, peeling or loosening of the skin, including inside the mouth  signs and symptoms of heart failure like breathing problems, fast, irregular heartbeat, sudden weight gain; swelling of the ankles, feet, hands; unusually weak or tired  signs and symptoms of low blood sugar such as feeling anxious, confusion, dizziness, increased hunger, unusually weak or tired, sweating, shakiness, cold, irritable, headache, blurred vision, fast heartbeat, loss of consciousness  signs and symptoms of muscle injury like dark urine; trouble passing urine or change in the amount of urine; unusually weak or tired; muscle pain; back pain  unusual stomach upset or pain  vomiting Side effects that usually do not require medical attention (report to your doctor or health care professional if they continue or are bothersome):  diarrhea  headache  sore throat  stomach upset  stuffy or runny nose This list may not describe all possible side effects. Call your doctor for medical advice about side effects. You may report side effects to FDA at 1-800-FDA-1088. Where should I keep my medicine? Keep out of the reach of children. Store at room temperature between 15 and 30 degrees C (59 and 86 degrees F). Throw away any unused medicine after the expiration date. NOTE: This sheet is a summary. It may not  cover all possible information. If you have questions about this medicine, talk to your doctor, pharmacist, or health care provider.  2020 Elsevier/Gold Standard (2017-12-09 16:38:43)

## 2019-11-06 LAB — STD SCREEN (8)
HIV Screen 4th Generation wRfx: NONREACTIVE
HSV 1 Glycoprotein G Ab, IgG: 42.8 index — ABNORMAL HIGH (ref 0.00–0.90)
HSV 2 IgG, Type Spec: <0.91 index (ref 0.00–0.90)
Hep A IgM: NEGATIVE
Hep B C IgM: NEGATIVE
Hep C Virus Ab: <0.1 s/co ratio (ref 0.0–0.9)
Hepatitis B Surface Ag: NEGATIVE
RPR Ser Ql: NONREACTIVE

## 2019-11-09 ENCOUNTER — Ambulatory Visit: Payer: Self-pay | Admitting: Family Medicine

## 2019-12-31 ENCOUNTER — Other Ambulatory Visit: Payer: Self-pay | Admitting: Family Medicine

## 2019-12-31 DIAGNOSIS — R600 Localized edema: Secondary | ICD-10-CM

## 2019-12-31 MED FILL — !LEVEMIR 100 UNITS/ML VIAL: 100/ML | 28 days supply | Qty: 10 | Fill #1

## 2020-01-03 MED FILL — LEVEMIR 100 UNITS/ML VIAL: 100 | 33 days supply | Qty: 10 | Fill #1

## 2020-01-05 MED FILL — HumaLOG 100 UNIT/ML SOLN: 100 | 25 days supply | Qty: 10 | Fill #1

## 2020-01-10 ENCOUNTER — Ambulatory Visit (INDEPENDENT_AMBULATORY_CARE_PROVIDER_SITE_OTHER): Payer: Self-pay | Admitting: Family Medicine

## 2020-01-10 ENCOUNTER — Encounter: Payer: Self-pay | Admitting: Family Medicine

## 2020-01-10 VITALS — BP 178/108 | HR 94 | Temp 98.7°F | Resp 18 | Ht 67.0 in | Wt 146.6 lb

## 2020-01-10 DIAGNOSIS — R7309 Other abnormal glucose: Secondary | ICD-10-CM

## 2020-01-10 DIAGNOSIS — F419 Anxiety disorder, unspecified: Secondary | ICD-10-CM

## 2020-01-10 DIAGNOSIS — Z91199 Patient's noncompliance with other medical treatment and regimen due to unspecified reason: Secondary | ICD-10-CM

## 2020-01-10 DIAGNOSIS — E1165 Type 2 diabetes mellitus with hyperglycemia: Secondary | ICD-10-CM

## 2020-01-10 DIAGNOSIS — Z9119 Patient's noncompliance with other medical treatment and regimen: Secondary | ICD-10-CM

## 2020-01-10 DIAGNOSIS — E1142 Type 2 diabetes mellitus with diabetic polyneuropathy: Secondary | ICD-10-CM

## 2020-01-10 DIAGNOSIS — R739 Hyperglycemia, unspecified: Secondary | ICD-10-CM

## 2020-01-10 DIAGNOSIS — Z09 Encounter for follow-up examination after completed treatment for conditions other than malignant neoplasm: Secondary | ICD-10-CM

## 2020-01-10 DIAGNOSIS — I160 Hypertensive urgency: Secondary | ICD-10-CM

## 2020-01-10 DIAGNOSIS — I10 Essential (primary) hypertension: Secondary | ICD-10-CM

## 2020-01-10 DIAGNOSIS — R600 Localized edema: Secondary | ICD-10-CM

## 2020-01-10 DIAGNOSIS — IMO0002 Reserved for concepts with insufficient information to code with codable children: Secondary | ICD-10-CM

## 2020-01-10 MED ORDER — FUROSEMIDE 20 MG PO TABS: 20.0000 mg | ORAL_TABLET | Freq: Every day | ORAL | 3 refills | Status: DC

## 2020-01-10 MED ORDER — CLONIDINE HCL 0.1 MG PO TABS
0.1000 mg | ORAL_TABLET | Freq: Once | ORAL | Status: AC
  Administered 2020-01-10: 0.1 mg via ORAL

## 2020-01-10 NOTE — Progress Notes (Signed)
Patient [facility]   Established Patient Office Visit  Subjective:  Patient ID: [name], male    DOB: 06/18/1970  Age: 49 y.o. MRN: [id number]  CC:  Chief Complaint  Patient presents with  . Foot Swelling    X2-3 wks    HPI [name] is a 49 year old male who presents for Follow Up today.    Patient Active Problem List   Diagnosis Date Noted  . Elevated brain natriuretic peptide (BNP) level 10/07/2019  . Non compliance with medical treatment 08/06/2019  . Left ventricular dysfunction 04/27/2019  . Hypertensive urgency 04/07/2019  . Chest pain 04/07/2019  . Dyspnea on exertion 03/04/2019  . Elevated glucose 07/29/2018  . Muscle spasms of both lower extremities 07/29/2018  . Skin rash 07/29/2018  . 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 Healthcare maintenance 07/02/2016  . New onset left bundle branch block (LBBB)   . 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 Hyperlipidemia LDL goal <70 11/24/2008  . Uncontrolled type 2 diabetes mellitus with peripheral neuropathy ([place]) 07/17/2006  . Essential hypertension 07/17/2006    Past Medical History:  Diagnosis Date  . Anxiety 02/2019  . Anxiety 10/2019  . Diabetes mellitus type II, uncontrolled ([place]) 07/17/2006  . Elevated alkaline phosphatase level 01/2019  . ESSENTIAL HYPERTENSION 07/17/2006  . HYPERLIPIDEMIA 11/24/2008  . Insomnia 02/2019  . Left bundle branch block 02/22/2019  . Lesion of lip 10/2019  . Pneumonia 10/22/2011  . Suicidal ideations 02/2019  . Vitamin D deficiency 01/2019    Past Surgical History:  Procedure Laterality Date  . ARM SURGERY     Current Status: Since his last office visit, he has complaints of bilateral leg edema. Patient remains non-compliant with his medications. He has not been monitoring his blood glucose levels regularly. He denies fatigue, frequent urination, blurred vision, excessive hunger, excessive thirst,  weight gain, weight loss, and poor wound healing. He continues to check his feet regularly. He denies visual changes, chest pain, cough, shortness of breath, heart palpitations, and falls. He has occasional headaches and dizziness with position changes. Denies severe headaches, confusion, seizures, double vision, and blurred vision, nausea and vomiting. He denies fevers, chills, recent infections, weight loss, and night sweats. Denies GI problems such as diarrhea, and constipation. He has no reports of blood in stools, dysuria and hematuria. No depression or anxiety reported. He is taking all medications as prescribed. He denies pain today.   Family History  Problem Relation Age of Onset  . Hypertension Mother 44  . Cancer Mother        bladder cancer  . Alcohol abuse Father   . Hypertension Brother     Social History   Socioeconomic History  . Marital status: Legally Separated    Spouse name: Not on file  . Number of children: Not on file  . Years of education: Not on file  . Highest education level: Not on file  Occupational History  . 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 Occupation: Biochemist, clinical    Comment: Updated June 2013  . Occupation: Engineer, civil (consulting)    Comment: Began Jan 2014  Tobacco Use  . Smoking status: Never Smoker  . Smokeless tobacco: Never Used  Vaping Use  . Vaping Use: Never used  Substance and Sexual Activity  . Alcohol use: No    Alcohol/week: 0.0 standard drinks  . Drug use: No  . Sexual activity:  Yes    Partners: Female  Other Topics Concern  . Not on file  Social History Narrative   Lives with his mother and brother.   Wife has had a foot amputation; is a type 1 DM.   Social Determinants of Health   Financial Resource Strain:   . Difficulty of Paying Living Expenses: Not on file  Food Insecurity:   . Worried About Charity fundraiser in the Last Year: Not on file  . Ran Out of Food in the Last Year: Not on file  Transportation Needs:     . Lack of Transportation (Medical): Not on file  . Lack of Transportation (Non-Medical): Not on file  Physical Activity:   . Days of Exercise per Week: Not on file  . Minutes of Exercise per Session: Not on file  Stress:   . Feeling of Stress : Not on file  Social Connections:   . Frequency of Communication with Friends and Family: Not on file  . Frequency of Social Gatherings with Friends and Family: Not on file  . Attends Religious Services: Not on file  . Active Member of Clubs or Organizations: Not on file  . Attends Archivist Meetings: Not on file  . Marital Status: Not on file  Intimate Partner Violence:   . Fear of Current or Ex-Partner: Not on file  . Emotionally Abused: Not on file  . Physically Abused: Not on file  . Sexually Abused: Not on file    Outpatient Medications Prior to Visit  Medication Sig Dispense Refill  . amLODipine (NORVASC) 5 MG tablet Take 1 tablet (5 mg total) by mouth daily. 180 tablet 3  . atorvastatin (LIPITOR) 20 MG tablet Take 1 tablet (20 mg total) by mouth daily. 90 tablet 3  . busPIRone (BUSPAR) 10 MG tablet Take 1 tablet (10 mg total) by mouth 2 (two) times daily. 60 tablet 3  . glucose blood test strip Use to check fasting blood sugar once daily. 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 Insulin dependent 100 each 12  . insulin detemir (LEVEMIR) 100 UNIT/ML injection Inject 0.3 mLs (30 Units total) into the skin daily. 10 mL 11  . Insulin Syringes, Disposable, U-100 0.3 ML MISC 100 Syringes by Does not apply route 3 (three) times daily with meals as needed. 100 each 6  . metFORMIN (GLUCOPHAGE) 1000 MG tablet Take 1 tablet (1,000 mg total) by mouth 2 (two) times daily with a meal. (Patient not taking: Reported on 11/05/2019) 180 tablet 3  . sildenafil (VIAGRA) 25 MG tablet Take 1 tablet (25 mg total) by mouth as needed for erectile dysfunction. for erectile dysfunction. 30 tablet 3  . sitaGLIPtin (JANUVIA) 25 MG tablet Take 1 tablet (25 mg total) by mouth  daily. 30 tablet 6  . furosemide (LASIX) 20 MG tablet Take 1 tablet (20 mg total) by mouth daily for 5 days. 5 tablet 0   No facility-administered medications prior to visit.    Allergies  Allergen Reactions  . Sulfa Antibiotics Rash    Severe rash. Do not give sulfa medications  . Bactrim [Sulfamethoxazole-Trimethoprim]     Rash  . Cephalexin     Patient with severe drug reaction including exfoliating skin rash and hypotension after being prescribed both cephalexin and sulfamethoxazole-trimethoprim simultaneously. Favor SMX as much more likely culprit as patient had tolerated other cephalosporins in the past, but cannot say with complete certainty that this was not related to cephalexin.   . Other     Patient  states he is allergic to the TB test    ROS Review of Systems  Constitutional: Negative.   HENT: Negative.   Eyes: Negative.   Respiratory: Negative.   Cardiovascular: Positive for leg swelling (bilateral ).  Gastrointestinal: Negative.   Endocrine: Negative.   Genitourinary: Negative.   Musculoskeletal: Negative.   Skin: Negative.   Allergic/Immunologic: Negative.   Neurological: Positive for dizziness (occasional ) and headaches (occasional ).  Hematological: Negative.   Psychiatric/Behavioral: The patient is hyperactive.       Objective:    Physical Exam Vitals and nursing note reviewed.  Constitutional:      Appearance: Normal appearance.  HENT:     Head: Normocephalic and atraumatic.     Nose: Nose normal.     Mouth/Throat:     Mouth: Mucous membranes are moist.     Pharynx: Oropharynx is clear.  Cardiovascular:     Rate and Rhythm: Normal rate and regular rhythm.     Pulses: Normal pulses.     Heart sounds: Normal heart sounds.  Pulmonary:     Effort: Pulmonary effort is normal.     Breath sounds: Normal breath sounds.  Abdominal:     General: Bowel sounds are normal.     Palpations: Abdomen is soft.  Musculoskeletal:        General: Swelling  (bilateral lower extremity edema) present. Normal range of motion.     Cervical back: Normal range of motion and neck supple.  Skin:    General: Skin is warm and dry.  Neurological:     General: No focal deficit present.     Mental Status: He is alert and oriented to person, place, and time.  Psychiatric:        Mood and Affect: Mood normal.        Behavior: Behavior normal.        Thought Content: Thought content normal.    BP (!) 178/108   Pulse 94   Temp 98.7 F (37.1 C)   Resp 18   Ht 5\' 7"  (1.702 m)   Wt 146 lb 9.6 oz (66.5 kg)   SpO2 100%   BMI 22.96 kg/m  Wt Readings from Last 3 Encounters:  01/10/20 146 lb 9.6 oz (66.5 kg)  11/05/19 142 lb 0.2 oz (64.4 kg)  10/13/19 143 lb 9.6 oz (65.1 kg)     Health Maintenance Due  Topic Date Due  . OPHTHALMOLOGY EXAM  Never done  . COVID-19 Vaccine (1) Never done  . TETANUS/TDAP  Never done  . FOOT EXAM  08/20/2017  . INFLUENZA VACCINE  12/19/2019    There are no preventive care reminders to display for this patient.  Lab Results  Component Value Date   TSH 2.470 01/22/2019   Lab Results  Component Value Date   WBC 6.8 06/05/2019   HGB 10.6 (L) 06/05/2019   HCT 32.1 (L) 06/05/2019   MCV 82.9 06/05/2019   PLT 412 (H) 06/05/2019   Lab Results  Component Value Date   NA 141 10/06/2019   K 3.7 10/06/2019   CO2 28 06/05/2019   GLUCOSE 199 (H) 10/06/2019   BUN 21 10/06/2019   CREATININE 1.67 (H) 10/06/2019   BILITOT <0.2 10/06/2019   ALKPHOS 151 (H) 10/06/2019   AST 21 10/06/2019   ALT 7 07/27/2019   PROT 5.7 (L) 10/06/2019   ALBUMIN 3.2 (L) 10/06/2019   CALCIUM 8.5 (L) 10/06/2019   ANIONGAP 11 06/05/2019   Lab Results  Component Value  Date   CHOL 176 07/27/2019   Lab Results  Component Value Date   HDL 44 07/27/2019   Lab Results  Component Value Date   LDLCALC 114 (H) 07/27/2019   Lab Results  Component Value Date   TRIG 100 07/27/2019   Lab Results  Component Value Date   CHOLHDL 4.0  07/27/2019   Lab Results  Component Value Date   HGBA1C 13.4 (A) 11/05/2019   HGBA1C 13.4 11/05/2019   HGBA1C 13.4 (A) 11/05/2019   HGBA1C 13.4 (A) 11/05/2019      Assessment & Plan:   1. Non compliance with medical treatment Refills on all medications sent to pharmacy today. He states that he will begin taking medication as prescribed.   2. Hypertensive urgency Blood pressures are elevated today. Clonidine 0.2 mg given to patient in office and blood pressures remain elevated. We referred him to ED via ambulance at this time. Patient refused and signed AMA form at discharge. He denies severe headaches, confusion, seizures, double vision, and blurred vision, nausea and vomiting. He will report to ED if he experiences these symptoms. Patient verbalized understanding.   - cloNIDine (CATAPRES) tablet 0.1 mg - cloNIDine (CATAPRES) tablet 0.1 mg  3. Essential hypertension He will continue to take medications as prescribed, to decrease high sodium intake, excessive alcohol intake, increase potassium intake, smoking cessation, and increase physical activity of at least 30 minutes of cardio activity daily. He will continue to follow Heart Healthy or DASH diet.  4. Uncontrolled type 2 diabetes mellitus with peripheral neuropathy ([place]) He will continue medication as prescribed, to decrease foods/beverages high in sugars and carbs and follow Heart Healthy or DASH diet. Increase physical activity to at least 30 minutes cardio exercise daily.   5. Hemoglobin A1C greater than 9%, indicating poor diabetic control Most recent Hgb A1c at 13.4. Monitor.   6. Hyperglycemia  7. Edema of both lower extremities - furosemide (LASIX) 20 MG tablet; Take 1 tablet (20 mg total) by mouth daily.  Dispense: 60 tablet; Refill: 3  8. Anxiety  9. Follow up He will follow up in 1 week.   Meds ordered this encounter  Medications  . furosemide (LASIX) 20 MG tablet    Sig: Take 1 tablet (20 mg total) by mouth  daily.    Dispense:  60 tablet    Refill:  3  . cloNIDine (CATAPRES) tablet 0.1 mg  . cloNIDine (CATAPRES) tablet 0.1 mg    No orders of the defined types were placed in this encounter.   Referral Orders  No referral(s) requested today    Kathe Becton,  MSN, FNP-BC [facility] [address] [place], [place] [address] [phone]- fax   Problem List Items Addressed This Visit      Cardiovascular and Mediastinum   Essential hypertension   Relevant Medications   furosemide (LASIX) 20 MG tablet   Hypertensive urgency   Relevant Medications   furosemide (LASIX) 20 MG tablet     Endocrine   Uncontrolled type 2 diabetes mellitus with peripheral neuropathy ([facility])     Other   Non compliance with medical treatment - Primary    Other Visit Diagnoses    Hemoglobin A1C greater than 9%, indicating poor diabetic control       Hyperglycemia       Edema of both lower extremities       R >L    Relevant Medications  furosemide (LASIX) 20 MG tablet   Anxiety       Follow up          Meds ordered this encounter  Medications  . furosemide (LASIX) 20 MG tablet    Sig: Take 1 tablet (20 mg total) by mouth daily.    Dispense:  60 tablet    Refill:  3  . cloNIDine (CATAPRES) tablet 0.1 mg  . cloNIDine (CATAPRES) tablet 0.1 mg    Follow-up: Return in about 1 week (around 01/17/2020).    [name], FNP

## 2020-01-11 MED FILL — FUROSEMIDE 20 MG TABS: 20 | 30 days supply | Qty: 30 | Fill #0

## 2020-01-11 MED FILL — HumaLOG 100 UNIT/ML SOLN: 100 | 25 days supply | Qty: 10 | Fill #1

## 2020-01-17 ENCOUNTER — Ambulatory Visit (INDEPENDENT_AMBULATORY_CARE_PROVIDER_SITE_OTHER): Payer: HRSA Program | Admitting: Family Medicine

## 2020-01-17 ENCOUNTER — Other Ambulatory Visit: Payer: Self-pay

## 2020-01-17 ENCOUNTER — Encounter: Payer: Self-pay | Admitting: Family Medicine

## 2020-01-17 VITALS — BP 165/94 | HR 90 | Temp 97.7°F | Ht 67.0 in | Wt 140.0 lb

## 2020-01-17 DIAGNOSIS — Z91199 Patient's noncompliance with other medical treatment and regimen due to unspecified reason: Secondary | ICD-10-CM

## 2020-01-17 DIAGNOSIS — E1165 Type 2 diabetes mellitus with hyperglycemia: Secondary | ICD-10-CM

## 2020-01-17 DIAGNOSIS — R7309 Other abnormal glucose: Secondary | ICD-10-CM

## 2020-01-17 DIAGNOSIS — E1142 Type 2 diabetes mellitus with diabetic polyneuropathy: Secondary | ICD-10-CM

## 2020-01-17 DIAGNOSIS — R739 Hyperglycemia, unspecified: Secondary | ICD-10-CM

## 2020-01-17 DIAGNOSIS — I10 Essential (primary) hypertension: Secondary | ICD-10-CM

## 2020-01-17 DIAGNOSIS — Z9119 Patient's noncompliance with other medical treatment and regimen: Secondary | ICD-10-CM

## 2020-01-17 DIAGNOSIS — Z09 Encounter for follow-up examination after completed treatment for conditions other than malignant neoplasm: Secondary | ICD-10-CM

## 2020-01-17 DIAGNOSIS — IMO0002 Reserved for concepts with insufficient information to code with codable children: Secondary | ICD-10-CM

## 2020-01-17 DIAGNOSIS — F419 Anxiety disorder, unspecified: Secondary | ICD-10-CM

## 2020-01-17 LAB — POCT GLYCOSYLATED HEMOGLOBIN (HGB A1C)
HbA1c POC (<> result, manual entry): 12.5 % (ref 4.0–5.6)
HbA1c, POC (controlled diabetic range): 12.4 % — AB (ref 0.0–7.0)
HbA1c, POC (prediabetic range): 12.5 % — AB (ref 5.7–6.4)
Hemoglobin A1C: 12.5 % — AB (ref 4.0–5.6)

## 2020-01-17 MED FILL — ?CARVEDILOL 6.25 MG TABLET: 6.25 | 30 days supply | Qty: 60 | Fill #0

## 2020-01-17 MED FILL — DILTIAZEM 24HR ER 120 MG CA: 120 | 30 days supply | Qty: 30 | Fill #0

## 2020-01-17 NOTE — Progress Notes (Signed)
Patient [facility]   Established Patient Office Visit  Subjective:  Patient ID: [name], male    DOB: 23-Mar-1971  Age: 49 y.o. MRN: [id number]  CC:  Chief Complaint  Patient presents with  . Follow-up    HPI [name] is a 49 year old male who presents for Follow Up today.   Past Medical History:  Diagnosis Date  . Anxiety 02/2019  . Anxiety 10/2019  . Diabetes mellitus type II, uncontrolled ([place]) 07/17/2006  . Elevated alkaline phosphatase level 01/2019  . ESSENTIAL HYPERTENSION 07/17/2006  . HYPERLIPIDEMIA 11/24/2008  . Insomnia 02/2019  . Left bundle branch block 02/22/2019  . Lesion of lip 10/2019  . Pneumonia 10/22/2011  . Suicidal ideations 02/2019  . Vitamin D deficiency 01/2019    Past Surgical History:  Procedure Laterality Date  . ARM SURGERY     Current Status: Since his last office visit, he is doing well with no complaints. He denies fatigue, frequent urination, blurred vision, excessive hunger, excessive thirst, weight gain, weight loss, and poor wound healing. He continues to check his feet regularly. He denies visual changes, chest pain, cough, shortness of breath, heart palpitations, and falls. He has occasional headaches and dizziness with position changes. Denies severe headaches, confusion, seizures, double vision, and blurred vision, nausea and vomiting. He denies fevers, chills, recent infections, weight loss, and night sweats. He denies GI problems such as diarrhea, and constipation. He has no reports of blood in stools, dysuria and hematuria. His anxiety is moderate today r/t personal and family stressors. H denies suicidal ideations, homicidal ideations, or auditory hallucinations. He is beginning to take medications as prescribed.  He denies pain today.   Family History  Problem Relation Age of Onset  . Hypertension Mother 47  . Cancer Mother        bladder cancer  . Alcohol abuse Father   .  Hypertension Brother     Social History   Socioeconomic History  . Marital status: Legally Separated    Spouse name: Not on file  . Number of children: Not on file  . Years of education: Not on file  . Highest education level: Not on file  Occupational History  . 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 Occupation: Biochemist, clinical    Comment: Updated June 2013  . Occupation: Engineer, civil (consulting)    Comment: Began Jan 2014  Tobacco Use  . Smoking status: Never Smoker  . Smokeless tobacco: Never Used  Vaping Use  . Vaping Use: Never used  Substance and Sexual Activity  . Alcohol use: No    Alcohol/week: 0.0 standard drinks  . Drug use: No  . Sexual activity: Yes    Partners: Female  Other Topics Concern  . Not on file  Social History Narrative   Lives with his mother and brother.   Wife has had a foot amputation; is a type 1 DM.   Social Determinants of Health   Financial Resource Strain:   . Difficulty of Paying Living Expenses: Not on file  Food Insecurity:   . Worried About Charity fundraiser in the Last Year: Not on file  . Ran Out of Food in the Last Year: Not on file  Transportation Needs:   . Lack of Transportation (Medical): Not on file  . Lack of Transportation (Non-Medical): Not on file  Physical Activity:   . Days of Exercise per Week: Not on  file  . Minutes of Exercise per Session: Not on file  Stress:   . Feeling of Stress : Not on file  Social Connections:   . Frequency of Communication with Friends and Family: Not on file  . Frequency of Social Gatherings with Friends and Family: Not on file  . Attends Religious Services: Not on file  . Active Member of Clubs or Organizations: Not on file  . Attends Archivist Meetings: Not on file  . Marital Status: Not on file  Intimate Partner Violence:   . Fear of Current or Ex-Partner: Not on file  . Emotionally Abused: Not on file  . Physically Abused: Not on file  . Sexually Abused: Not on file     Outpatient Medications Prior to Visit  Medication Sig Dispense Refill  . amLODipine (NORVASC) 5 MG tablet Take 1 tablet (5 mg total) by mouth daily. 180 tablet 3  . atorvastatin (LIPITOR) 20 MG tablet Take 1 tablet (20 mg total) by mouth daily. 90 tablet 3  . busPIRone (BUSPAR) 10 MG tablet Take 1 tablet (10 mg total) by mouth 2 (two) times daily. 60 tablet 3  . furosemide (LASIX) 20 MG tablet Take 1 tablet (20 mg total) by mouth daily. 60 tablet 3  . glucose blood test strip Use to check fasting blood sugar once daily. 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 Insulin dependent 100 each 12  . insulin detemir (LEVEMIR) 100 UNIT/ML injection Inject 0.3 mLs (30 Units total) into the skin daily. 10 mL 11  . Insulin Syringes, Disposable, U-100 0.3 ML MISC 100 Syringes by Does not apply route 3 (three) times daily with meals as needed. 100 each 6  . metFORMIN (GLUCOPHAGE) 1000 MG tablet Take 1 tablet (1,000 mg total) by mouth 2 (two) times daily with a meal. 180 tablet 3  . sildenafil (VIAGRA) 25 MG tablet Take 1 tablet (25 mg total) by mouth as needed for erectile dysfunction. for erectile dysfunction. 30 tablet 3  . sitaGLIPtin (JANUVIA) 25 MG tablet Take 1 tablet (25 mg total) by mouth daily. 30 tablet 6   No facility-administered medications prior to visit.    Allergies  Allergen Reactions  . Sulfa Antibiotics Rash    Severe rash. Do not give sulfa medications  . Bactrim [Sulfamethoxazole-Trimethoprim]     Rash  . Cephalexin     Patient with severe drug reaction including exfoliating skin rash and hypotension after being prescribed both cephalexin and sulfamethoxazole-trimethoprim simultaneously. Favor SMX as much more likely culprit as patient had tolerated other cephalosporins in the past, but cannot say with complete certainty that this was not related to cephalexin.   . Other     Patient states he is allergic to the TB test    ROS Review of Systems  Constitutional: Negative.   HENT: Negative.    Eyes: Positive for visual disturbance (blurred vision).  Respiratory: Negative.   Cardiovascular: Negative.   Gastrointestinal: Negative.   Endocrine: Negative.   Genitourinary: Negative.   Musculoskeletal: Positive for arthralgias (generalized joint pain).  Skin: Negative.   Allergic/Immunologic: Negative.   Neurological: Positive for dizziness (occasional ) and headaches (occasional ).  Hematological: Negative.   Psychiatric/Behavioral: Positive for agitation. The patient is nervous/anxious.       Objective:    Physical Exam Vitals and nursing note reviewed.  Constitutional:      Appearance: Normal appearance.  HENT:     Head: Normocephalic and atraumatic.     Nose: Nose normal.  Mouth/Throat:     Mouth: Mucous membranes are moist.     Pharynx: Oropharynx is clear.  Cardiovascular:     Rate and Rhythm: Normal rate and regular rhythm.     Pulses: Normal pulses.     Heart sounds: Normal heart sounds.  Pulmonary:     Effort: Pulmonary effort is normal.     Breath sounds: Normal breath sounds.  Abdominal:     General: Bowel sounds are normal.     Palpations: Abdomen is soft.  Musculoskeletal:        General: Normal range of motion.     Cervical back: Normal range of motion and neck supple.  Skin:    General: Skin is warm and dry.  Neurological:     General: No focal deficit present.     Mental Status: He is alert and oriented to person, place, and time.  Psychiatric:        Mood and Affect: Mood normal.        Behavior: Behavior normal.        Thought Content: Thought content normal.        Judgment: Judgment normal.     BP (!) 165/94   Pulse 90   Temp 97.7 F (36.5 C) (Temporal)   Ht 5\' 7"  (1.702 m)   Wt 140 lb (63.5 kg)   SpO2 100%   BMI 21.93 kg/m  Wt Readings from Last 3 Encounters:  01/17/20 140 lb (63.5 kg)  01/10/20 146 lb 9.6 oz (66.5 kg)  11/05/19 142 lb 0.2 oz (64.4 kg)     Health Maintenance Due  Topic Date Due  . OPHTHALMOLOGY EXAM   Never done  . COVID-19 Vaccine (1) Never done  . TETANUS/TDAP  Never done  . FOOT EXAM  08/20/2017  . URINE MICROALBUMIN  06/10/2018  . INFLUENZA VACCINE  12/19/2019    There are no preventive care reminders to display for this patient.  Lab Results  Component Value Date   TSH 2.470 01/22/2019   Lab Results  Component Value Date   WBC 6.8 06/05/2019   HGB 10.6 (L) 06/05/2019   HCT 32.1 (L) 06/05/2019   MCV 82.9 06/05/2019   PLT 412 (H) 06/05/2019   Lab Results  Component Value Date   NA 141 10/06/2019   K 3.7 10/06/2019   CO2 28 06/05/2019   GLUCOSE 199 (H) 10/06/2019   BUN 21 10/06/2019   CREATININE 1.67 (H) 10/06/2019   BILITOT <0.2 10/06/2019   ALKPHOS 151 (H) 10/06/2019   AST 21 10/06/2019   ALT 7 07/27/2019   PROT 5.7 (L) 10/06/2019   ALBUMIN 3.2 (L) 10/06/2019   CALCIUM 8.5 (L) 10/06/2019   ANIONGAP 11 06/05/2019   Lab Results  Component Value Date   CHOL 176 07/27/2019   Lab Results  Component Value Date   HDL 44 07/27/2019   Lab Results  Component Value Date   LDLCALC 114 (H) 07/27/2019   Lab Results  Component Value Date   TRIG 100 07/27/2019   Lab Results  Component Value Date   CHOLHDL 4.0 07/27/2019   Lab Results  Component Value Date   HGBA1C 12.5 (A) 01/17/2020   HGBA1C 12.5 01/17/2020   HGBA1C 12.5 (A) 01/17/2020   HGBA1C 12.4 (A) 01/17/2020      Assessment & Plan:   1. Uncontrolled type 2 diabetes mellitus with peripheral neuropathy ([place]) He will continue medication as prescribed, to decrease foods/beverages high in sugars and carbs and follow Heart Healthy  or DASH diet. Increase physical activity to at least 30 minutes cardio exercise daily.   2. Hemoglobin A1C greater than 9%, indicating poor diabetic control Most recent Hgb A1c at 13.4 at 13.4. He states that he will re-start taking his medications as prescribed. Monitor.  - POCT HgB A1C  3. Hyperglycemia  4. Essential hypertension He will continue to take medications  as prescribed, to decrease high sodium intake, excessive alcohol intake, increase potassium intake, smoking cessation, and increase physical activity of at least 30 minutes of cardio activity daily. He will continue to follow Heart Healthy or DASH diet.  5. Non compliance with medical treatment  6. Anxiety Stable.   7. Follow up He will keep follow up appointment.   No orders of the defined types were placed in this encounter.   Orders Placed This Encounter  Procedures  . POCT HgB A1C    Referral Orders  No referral(s) requested today    Kathe Becton,  MSN, FNP-BC [place], [place] [address] [phone] fax  Problem List Items Addressed This Visit      Cardiovascular and Mediastinum   Essential hypertension     Endocrine   Uncontrolled type 2 diabetes mellitus with peripheral neuropathy ([place]) - Primary     Other   Non compliance with medical treatment    Other Visit Diagnoses    Hemoglobin A1C greater than 9%, indicating poor diabetic control       Relevant Orders   POCT HgB A1C (Completed)   Hyperglycemia       Anxiety       Follow up          No orders of the defined types were placed in this encounter.   Follow-up: No follow-ups on file.    [name], FNP

## 2020-01-18 MED FILL — VIT D2 1.25 MG (50,000 UNIT: 1.25 MG | 56 days supply | Qty: 8 | Fill #0

## 2020-01-20 ENCOUNTER — Encounter: Payer: Self-pay | Admitting: Family Medicine

## 2020-01-30 ENCOUNTER — Other Ambulatory Visit: Payer: Self-pay

## 2020-01-30 ENCOUNTER — Encounter (HOSPITAL_COMMUNITY): Payer: Self-pay

## 2020-01-30 ENCOUNTER — Ambulatory Visit (HOSPITAL_COMMUNITY)
Admission: EM | Admit: 2020-01-30 | Discharge: 2020-01-30 | Disposition: A | Discharge status: Home / Self Care | Payer: Self-pay

## 2020-01-30 DIAGNOSIS — R609 Edema, unspecified: Secondary | ICD-10-CM

## 2020-01-30 NOTE — ED Triage Notes (Signed)
Pt presents with complaints of bilateral feet swelling x 1 week. Denies pain or injury. Reports he is a diabetic.

## 2020-01-30 NOTE — Discharge Instructions (Signed)
Begin wearing compression stockings daily until evening time, and prop your legs up at rest. Make sure to stay well hydrated and eat a low sodium diet. Follow up on Friday with your Primary Care Provider for re-check.

## 2020-01-30 NOTE — ED Provider Notes (Signed)
Nell[facility]    CSN: [id number] Arrival date & time: 01/30/20  1605      History   Chief Complaint Chief Complaint  Patient presents with  . Foot Swelling    HPI [name] is a 49 y.o. male.   Presenting today with about a week of recurring b/l foot and ankle edema. Had this happen several months ago as well, relieved by a diuretic but Nephrology took him off due to his CKD. Had negative DVT US b/l at that time. Denies any pain, discoloration, numbness, tingling, or other abnormal sxs at this time. Not trying anything OTC for these. Of note, long hx of poorly controlled DM and HTN. Has f/u with his PCP set for Friday.      Past Medical History:  Diagnosis Date  . Anxiety 02/2019  . Anxiety 10/2019  . Diabetes mellitus type II, uncontrolled ([place]) 07/17/2006  . Elevated alkaline phosphatase level 01/2019  . ESSENTIAL HYPERTENSION 07/17/2006  . HYPERLIPIDEMIA 11/24/2008  . Insomnia 02/2019  . Left bundle branch block 02/22/2019  . Lesion of lip 10/2019  . Pneumonia 10/22/2011  . Suicidal ideations 02/2019  . Vitamin D deficiency 01/2019    Patient Active Problem List   Diagnosis Date Noted  . Elevated brain natriuretic peptide (BNP) level 10/07/2019  . Non compliance with medical treatment 08/06/2019  . Left ventricular dysfunction 04/27/2019  . Hypertensive urgency 04/07/2019  . Chest pain 04/07/2019  . Dyspnea on exertion 03/04/2019  . Elevated glucose 07/29/2018  . Muscle spasms of both lower extremities 07/29/2018  . Skin rash 07/29/2018  . 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 Healthcare maintenance 07/02/2016  . New onset left bundle branch block (LBBB)   . 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 Hyperlipidemia LDL goal <70 11/24/2008  . Uncontrolled type 2 diabetes mellitus with peripheral neuropathy ([place]) 07/17/2006  . Essential hypertension 07/17/2006    Past Surgical History:  Procedure Laterality Date  . ARM SURGERY          Home Medications    Prior to Admission medications   Medication Sig Start Date End Date Taking? Authorizing Provider  amLODipine (NORVASC) 5 MG tablet Take 1 tablet (5 mg total) by mouth daily. 11/05/19 10/25/21  [name], FNP  atorvastatin (LIPITOR) 20 MG tablet Take 1 tablet (20 mg total) by mouth daily. 08/06/19   [name], FNP  busPIRone (BUSPAR) 10 MG tablet Take 1 tablet (10 mg total) by mouth 2 (two) times daily. 11/05/19   [name], FNP  furosemide (LASIX) 20 MG tablet Take 1 tablet (20 mg total) by mouth daily. 01/10/20 09/06/20  [name], FNP  glucose blood test strip Use to check fasting blood sugar once daily. 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 Insulin dependent 06/10/17   Gildardo Pounds, NP  insulin detemir (LEVEMIR) 100 UNIT/ML injection Inject 0.3 mLs (30 Units total) into the skin daily. 08/06/19   [name], FNP  Insulin Syringes, Disposable, U-100 0.3 ML MISC 100 Syringes by Does not apply route 3 (three) times daily with meals as needed. 07/29/18   [name], FNP  metFORMIN (GLUCOPHAGE) 1000 MG tablet Take 1 tablet (1,000 mg total) by mouth 2 (two) times daily with a meal. 10/13/19   [name], FNP  sildenafil (VIAGRA) 25 MG tablet Take 1 tablet (25 mg total) by mouth as needed for erectile dysfunction. for erectile dysfunction. 10/13/19   [name], FNP  sitaGLIPtin [place] Gentile)  25 MG tablet Take 1 tablet (25 mg total) by mouth daily. 11/05/19   [name], FNP  carvedilol (COREG) 6.25 MG tablet Take 1 tablet (6.25 mg total) by mouth 2 (two) times daily. Patient not taking: Reported on 07/27/2019 04/27/19 08/30/19  Lorretta Harp, MD  gabapentin (NEURONTIN) 300 MG capsule Take 1 capsule (300 mg total) by mouth 3 (three) times daily. Patient not taking: Reported on 07/27/2019 07/29/18 08/30/19  [name], FNP    Family History Family History  Problem Relation Age of Onset  . Hypertension Mother 2  . Cancer Mother         bladder cancer  . Alcohol abuse Father   . Hypertension Brother     Social History Social History   Tobacco Use  . Smoking status: Never Smoker  . Smokeless tobacco: Never Used  Vaping Use  . Vaping Use: Never used  Substance Use Topics  . Alcohol use: No    Alcohol/week: 0.0 standard drinks  . Drug use: No     Allergies   Sulfa antibiotics, Bactrim [sulfamethoxazole-trimethoprim], Cephalexin, and Other   Review of Systems Review of Systems PER HPI   Physical Exam Triage Vital Signs ED Triage Vitals  Enc Vitals Group     BP 01/30/20 1650 (!) 164/86     Pulse Rate 01/30/20 1650 87     Resp 01/30/20 1650 19     Temp 01/30/20 1650 98.9 F (37.2 C)     Temp src --      SpO2 01/30/20 1650 100 %     Weight --      Height --      Head Circumference --      Peak Flow --      Pain Score 01/30/20 1649 0     Pain Loc --      Pain Edu? --      Excl. in [place]? --    No data found.  Updated Vital Signs BP (!) 164/86   Pulse 87   Temp 98.9 F (37.2 C)   Resp 19   SpO2 100%   Visual Acuity Right Eye Distance:   Left Eye Distance:   Bilateral Distance:    Right Eye Near:   Left Eye Near:    Bilateral Near:     Physical Exam Vitals and nursing note reviewed.  Constitutional:      Appearance: Normal appearance.  HENT:     Head: Atraumatic.  Eyes:     Extraocular Movements: Extraocular movements intact.     Conjunctiva/sclera: Conjunctivae normal.  Cardiovascular:     Rate and Rhythm: Normal rate and regular rhythm.     Pulses: Normal pulses.  Pulmonary:     Effort: Pulmonary effort is normal.     Breath sounds: Normal breath sounds.  Abdominal:     General: Bowel sounds are normal. There is no distension.     Palpations: Abdomen is soft.     Tenderness: There is no abdominal tenderness. There is no guarding.  Musculoskeletal:        General: Swelling (1+ pitting edema b/l feet and ankles symmetrically) present. No tenderness. Normal range of  motion.     Cervical back: Normal range of motion and neck supple.     Comments: Neg homans sign and squeeze test b/l LEs  Skin:    General: Skin is warm and dry.  Neurological:     General: No focal deficit present.  Mental Status: He is oriented to person, place, and time.  Psychiatric:        Mood and Affect: Mood normal.        Thought Content: Thought content normal.        Judgment: Judgment normal.      UC Treatments / Results  Labs (all labs ordered are listed, but only abnormal results are displayed) Labs Reviewed - No data to display  EKG   Radiology No results found.  Procedures Procedures (including critical care time)  Medications Ordered in UC Medications - No data to display  Initial Impression / Assessment and Plan / UC Course  I have reviewed the triage vital signs and the nursing notes.  Pertinent labs & imaging results that were available during my care of the patient were reviewed by me and considered in my medical decision making (see chart for details).     Leg edema likely multifactorial, from long standing poorly controlled chronic conditions, poor hydration due to hyperglycemia. Discussed compression stockings daily, lots of exercise, leg elevation at rest, and importance of chronic condition control. No evidence of DVT today and he does not want to go to hospital for DVT u/s as previous one was WNL. F/u as scheduled with PCP next week for recheck. Discussed may want to switch off amlodipine if swelling remains.   Final Clinical Impressions(s) / UC Diagnoses   Final diagnoses:  Peripheral edema     Discharge Instructions     Begin wearing compression stockings daily until evening time, and prop your legs up at rest. Make sure to stay well hydrated and eat a low sodium diet. Follow up on Friday with your Primary Care Provider for re-check.      ED Prescriptions    None     PDMP not reviewed this encounter.   Volney American, [place] 01/30/20 1806

## 2020-02-04 ENCOUNTER — Ambulatory Visit (INDEPENDENT_AMBULATORY_CARE_PROVIDER_SITE_OTHER): Payer: Self-pay | Admitting: Family Medicine

## 2020-02-04 ENCOUNTER — Encounter: Payer: Self-pay | Admitting: Family Medicine

## 2020-02-04 ENCOUNTER — Other Ambulatory Visit: Payer: Self-pay

## 2020-02-04 VITALS — BP 158/92 | HR 90 | Temp 97.3°F | Resp 17 | Ht 67.0 in | Wt 146.6 lb

## 2020-02-04 DIAGNOSIS — F419 Anxiety disorder, unspecified: Secondary | ICD-10-CM

## 2020-02-04 DIAGNOSIS — Z9119 Patient's noncompliance with other medical treatment and regimen: Secondary | ICD-10-CM

## 2020-02-04 DIAGNOSIS — R8290 Unspecified abnormal findings in urine: Secondary | ICD-10-CM

## 2020-02-04 DIAGNOSIS — E1165 Type 2 diabetes mellitus with hyperglycemia: Secondary | ICD-10-CM

## 2020-02-04 DIAGNOSIS — Z91199 Patient's noncompliance with other medical treatment and regimen due to unspecified reason: Secondary | ICD-10-CM

## 2020-02-04 DIAGNOSIS — I10 Essential (primary) hypertension: Secondary | ICD-10-CM

## 2020-02-04 DIAGNOSIS — IMO0002 Reserved for concepts with insufficient information to code with codable children: Secondary | ICD-10-CM

## 2020-02-04 DIAGNOSIS — Z09 Encounter for follow-up examination after completed treatment for conditions other than malignant neoplasm: Secondary | ICD-10-CM

## 2020-02-04 DIAGNOSIS — R600 Localized edema: Secondary | ICD-10-CM

## 2020-02-04 DIAGNOSIS — R7309 Other abnormal glucose: Secondary | ICD-10-CM

## 2020-02-04 DIAGNOSIS — E1142 Type 2 diabetes mellitus with diabetic polyneuropathy: Secondary | ICD-10-CM

## 2020-02-04 DIAGNOSIS — R739 Hyperglycemia, unspecified: Secondary | ICD-10-CM

## 2020-02-04 LAB — POCT URINALYSIS DIPSTICK
Bilirubin, UA: NEGATIVE
Glucose, UA: POSITIVE — AB
Ketones, UA: NEGATIVE
Leukocytes, UA: NEGATIVE
Nitrite, UA: NEGATIVE
Protein, UA: POSITIVE — AB
Spec Grav, UA: 1.025 (ref 1.010–1.025)
Urobilinogen, UA: 0.2 E.U./dL
pH, UA: 7 (ref 5.0–8.0)

## 2020-02-04 LAB — GLUCOSE, POCT (MANUAL RESULT ENTRY): POC Glucose: 386 mg/dl — AB (ref 70–99)

## 2020-02-04 NOTE — Progress Notes (Signed)
Patient [name]   Established Patient Office Visit  Subjective:  Patient ID: SCOTT VANDERVEER, male    DOB: 10/14/1970  Age: 49 y.o. MRN: [id number]  CC:  Chief Complaint  Patient presents with  . Follow-up    Pt states he would like to discuss his feet. both his feet are swollen.    HPI [name] is a 49 year old who presents for Follow Up today.   Patient Active Problem List   Diagnosis Date Noted  . Elevated brain natriuretic peptide (BNP) level 10/07/2019  . Non compliance with medical treatment 08/06/2019  . Left ventricular dysfunction 04/27/2019  . Hypertensive urgency 04/07/2019  . Chest pain 04/07/2019  . Dyspnea on exertion 03/04/2019  . Elevated glucose 07/29/2018  . Muscle spasms of both lower extremities 07/29/2018  . Skin rash 07/29/2018  . 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 Healthcare maintenance 07/02/2016  . New onset left bundle branch block (LBBB)   . 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 Hyperlipidemia LDL goal <70 11/24/2008  . Uncontrolled type 2 diabetes mellitus with peripheral neuropathy ([place]) 07/17/2006  . Essential hypertension 07/17/2006   Current Status: Since his last office visit, he is doing well with no complaints. His blodd glucose levels are elevated today. He has not been monitoring his blood glucose levels lately which he states that he needs a new blood glucose monitor. He denies fatigue, frequent urination, blurred vision, excessive hunger, excessive thirst, weight gain, weight loss, and poor wound healing. He continues to check his feet regularly. He denies visual changes, chest pain, cough, shortness of breath, heart palpitations, and falls. He has occasional headaches and dizziness with position changes. Denies severe headaches, confusion, seizures, double vision, and blurred vision, nausea and vomiting. He denies fevers, chills, recent infections, weight loss,  and night sweats. Denies GI problems such as diarrhea, and constipation. He has no reports of blood in stools, dysuria and hematuria. No depression or anxiety reported today. He states that he is taking all medications as prescribed. He denies pain today.   Past Medical History:  Diagnosis Date  . Anxiety 02/2019  . Anxiety 10/2019  . Diabetes mellitus type II, uncontrolled ([place]) 07/17/2006  . Elevated alkaline phosphatase level 01/2019  . ESSENTIAL HYPERTENSION 07/17/2006  . HYPERLIPIDEMIA 11/24/2008  . Insomnia 02/2019  . Left bundle branch block 02/22/2019  . Lesion of lip 10/2019  . Pneumonia 10/22/2011  . Suicidal ideations 02/2019  . Vitamin D deficiency 01/2019    Past Surgical History:  Procedure Laterality Date  . ARM SURGERY      Family History  Problem Relation Age of Onset  . Hypertension Mother 79  . Cancer Mother        bladder cancer  . Alcohol abuse Father   . Hypertension Brother     Social History   Socioeconomic History  . Marital status: Legally Separated    Spouse name: Not on file  . Number of children: Not on file  . Years of education: Not on file  . Highest education level: Not on file  Occupational History  . 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 Occupation: Biochemist, clinical    Comment: Updated June 2013  . Occupation: Engineer, civil (consulting)    Comment: Began Jan 2014  Tobacco Use  . Smoking status: Never Smoker  . Smokeless tobacco: Never Used  Vaping Use  . Vaping Use: Never used  Substance  and Sexual Activity  . Alcohol use: No    Alcohol/week: 0.0 standard drinks  . Drug use: No  . Sexual activity: Yes    Partners: Female  Other Topics Concern  . Not on file  Social History Narrative   Lives with his mother and brother.   Wife has had a foot amputation; is a type 1 DM.   Social Determinants of Health   Financial Resource Strain:   . Difficulty of Paying Living Expenses: Not on file  Food Insecurity:   . Worried About Ship broker in the Last Year: Not on file  . Ran Out of Food in the Last Year: Not on file  Transportation Needs:   . Lack of Transportation (Medical): Not on file  . Lack of Transportation (Non-Medical): Not on file  Physical Activity:   . Days of Exercise per Week: Not on file  . Minutes of Exercise per Session: Not on file  Stress:   . Feeling of Stress : Not on file  Social Connections:   . Frequency of Communication with Friends and Family: Not on file  . Frequency of Social Gatherings with Friends and Family: Not on file  . Attends Religious Services: Not on file  . Active Member of Clubs or Organizations: Not on file  . Attends Archivist Meetings: Not on file  . Marital Status: Not on file  Intimate Partner Violence:   . Fear of Current or Ex-Partner: Not on file  . Emotionally Abused: Not on file  . Physically Abused: Not on file  . Sexually Abused: Not on file    Outpatient Medications Prior to Visit  Medication Sig Dispense Refill  . amLODipine (NORVASC) 5 MG tablet Take 1 tablet (5 mg total) by mouth daily. 180 tablet 3  . atorvastatin (LIPITOR) 20 MG tablet Take 1 tablet (20 mg total) by mouth daily. 90 tablet 3  . busPIRone (BUSPAR) 10 MG tablet Take 1 tablet (10 mg total) by mouth 2 (two) times daily. 60 tablet 3  . furosemide (LASIX) 20 MG tablet Take 1 tablet (20 mg total) by mouth daily. 60 tablet 3  . glucose blood test strip Use to check fasting blood sugar once daily. 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 Insulin dependent 100 each 12  . insulin detemir (LEVEMIR) 100 UNIT/ML injection Inject 0.3 mLs (30 Units total) into the skin daily. 10 mL 11  . Insulin Syringes, Disposable, U-100 0.3 ML MISC 100 Syringes by Does not apply route 3 (three) times daily with meals as needed. 100 each 6  . metFORMIN (GLUCOPHAGE) 1000 MG tablet Take 1 tablet (1,000 mg total) by mouth 2 (two) times daily with a meal. 180 tablet 3  . sildenafil (VIAGRA) 25 MG tablet Take 1 tablet (25 mg total) by  mouth as needed for erectile dysfunction. for erectile dysfunction. 30 tablet 3  . sitaGLIPtin (JANUVIA) 25 MG tablet Take 1 tablet (25 mg total) by mouth daily. 30 tablet 6   No facility-administered medications prior to visit.    Allergies  Allergen Reactions  . Sulfa Antibiotics Rash    Severe rash. Do not give sulfa medications  . Bactrim [Sulfamethoxazole-Trimethoprim]     Rash  . Cephalexin     Patient with severe drug reaction including exfoliating skin rash and hypotension after being prescribed both cephalexin and sulfamethoxazole-trimethoprim simultaneously. Favor SMX as much more likely culprit as patient had tolerated other cephalosporins in the past, but cannot say with complete certainty that this  was not related to cephalexin.   . Other     Patient states he is allergic to the TB test    ROS Review of Systems  Constitutional: Negative.   HENT: Negative.   Eyes: Negative.   Respiratory: Negative.   Cardiovascular: Negative.   Gastrointestinal: Positive for abdominal distention.  Endocrine: Negative.   Genitourinary: Negative.   Musculoskeletal: Positive for arthralgias (generalized joint pain).  Allergic/Immunologic: Negative.   Neurological: Positive for dizziness (occasional ) and headaches (occasional ).  Hematological: Negative.   Psychiatric/Behavioral: Negative.       Objective:    Physical Exam Vitals and nursing note reviewed.  Constitutional:      Appearance: Normal appearance.  HENT:     Head: Normocephalic and atraumatic.     Nose: Nose normal.     Mouth/Throat:     Mouth: Mucous membranes are moist.     Pharynx: Oropharynx is clear.  Cardiovascular:     Rate and Rhythm: Normal rate and regular rhythm.     Pulses: Normal pulses.     Heart sounds: Normal heart sounds.  Pulmonary:     Effort: Pulmonary effort is normal.     Breath sounds: Normal breath sounds.  Abdominal:     General: Bowel sounds are normal.     Palpations: Abdomen is  soft.  Musculoskeletal:        General: Normal range of motion.     Cervical back: Normal range of motion and neck supple.  Skin:    General: Skin is warm and dry.  Neurological:     General: No focal deficit present.     Mental Status: He is alert.  Psychiatric:        Mood and Affect: Mood normal.        Judgment: Judgment normal.     Comments: Anxious today.      BP (!) 158/92 (BP Location: Left Arm, Patient Position: Sitting, Cuff Size: Normal)   Pulse 90   Temp (!) 97.3 F (36.3 C)   Resp 17   Ht 5\' 7"  (1.702 m)   Wt 146 lb 9.6 oz (66.5 kg)   SpO2 100%   BMI 22.96 kg/m  Wt Readings from Last 3 Encounters:  02/04/20 146 lb 9.6 oz (66.5 kg)  01/17/20 140 lb (63.5 kg)  01/10/20 146 lb 9.6 oz (66.5 kg)     Health Maintenance Due  Topic Date Due  . OPHTHALMOLOGY EXAM  Never done  . COVID-19 Vaccine (1) Never done  . TETANUS/TDAP  Never done  . FOOT EXAM  08/20/2017  . URINE MICROALBUMIN  06/10/2018  . INFLUENZA VACCINE  12/19/2019    There are no preventive care reminders to display for this patient.  Lab Results  Component Value Date   TSH 2.470 01/22/2019   Lab Results  Component Value Date   WBC 6.8 06/05/2019   HGB 10.6 (L) 06/05/2019   HCT 32.1 (L) 06/05/2019   MCV 82.9 06/05/2019   PLT 412 (H) 06/05/2019   Lab Results  Component Value Date   NA 141 10/06/2019   K 3.7 10/06/2019   CO2 28 06/05/2019   GLUCOSE 199 (H) 10/06/2019   BUN 21 10/06/2019   CREATININE 1.67 (H) 10/06/2019   BILITOT <0.2 10/06/2019   ALKPHOS 151 (H) 10/06/2019   AST 21 10/06/2019   ALT 7 07/27/2019   PROT 5.7 (L) 10/06/2019   ALBUMIN 3.2 (L) 10/06/2019   CALCIUM 8.5 (L) 10/06/2019   ANIONGAP 11  06/05/2019   Lab Results  Component Value Date   CHOL 176 07/27/2019   Lab Results  Component Value Date   HDL 44 07/27/2019   Lab Results  Component Value Date   LDLCALC 114 (H) 07/27/2019   Lab Results  Component Value Date   TRIG 100 07/27/2019   Lab  Results  Component Value Date   CHOLHDL 4.0 07/27/2019   Lab Results  Component Value Date   HGBA1C 12.5 (A) 01/17/2020   HGBA1C 12.5 01/17/2020   HGBA1C 12.5 (A) 01/17/2020   HGBA1C 12.4 (A) 01/17/2020      Assessment & Plan:   1. Non compliance with medical treatment  2. Uncontrolled type 2 diabetes mellitus with peripheral neuropathy ([facility]) - POC Glucose (CBG) - Urinalysis Dipstick  3. Hemoglobin A1C greater than 9%, indicating poor diabetic control Most recent Hgb A1c at 12.5. He will take all medications as prescribed. Monitor.   4. Hyperglycemia  5. Essential hypertension He will continue to take medications as prescribed, to decrease high sodium intake, excessive alcohol intake, increase potassium intake, smoking cessation, and increase physical activity of at least 30 minutes of cardio activity daily. He will continue to follow Heart Healthy or DASH diet.  6. Edema of both lower extremities 1+ edema. Monitor.   7. Anxiety Moderate today.  8. Abnormal urine finding - Urine Culture  9. Follow up He will follow up as needed.   No orders of the defined types were placed in this encounter.   Orders Placed This Encounter  Procedures  . Urine Culture  . POC Glucose (CBG)  . Urinalysis Dipstick    Referral Orders  No referral(s) requested today    Kathe Becton,  MSN, FNP-BC [place] [address] [place], [place] [address] [phone] fax  Problem List Items Addressed This Visit      Cardiovascular and Mediastinum   Essential hypertension     Endocrine   Uncontrolled type 2 diabetes mellitus with peripheral neuropathy ([place]) - Primary   Relevant Orders   POC Glucose (CBG)     Other   Non compliance with medical treatment    Other Visit Diagnoses    Hemoglobin A1C greater than 9%, indicating poor diabetic control       Hyperglycemia         Edema of both lower extremities       Anxiety       Follow up          No orders of the defined types were placed in this encounter.   Follow-up: No follow-ups on file.    [name], FNP

## 2020-02-06 LAB — URINE CULTURE: Organism ID, Bacteria: NO GROWTH

## 2020-02-09 ENCOUNTER — Encounter: Payer: Self-pay | Admitting: Family Medicine

## 2020-02-15 ENCOUNTER — Ambulatory Visit: Payer: Self-pay | Admitting: Cardiovascular Disease

## 2020-02-21 ENCOUNTER — Ambulatory Visit: Payer: Self-pay

## 2020-02-21 ENCOUNTER — Other Ambulatory Visit: Payer: Self-pay

## 2020-02-22 ENCOUNTER — Ambulatory Visit: Payer: Self-pay | Admitting: Critical Care Medicine

## 2020-02-22 ENCOUNTER — Other Ambulatory Visit: Payer: Self-pay | Admitting: Critical Care Medicine

## 2020-02-22 VITALS — BP 184/106 | HR 93 | Temp 97.0°F | Resp 18 | Ht 67.0 in | Wt 153.0 lb

## 2020-02-22 DIAGNOSIS — I10 Essential (primary) hypertension: Secondary | ICD-10-CM

## 2020-02-22 DIAGNOSIS — R739 Hyperglycemia, unspecified: Secondary | ICD-10-CM

## 2020-02-22 DIAGNOSIS — Z23 Encounter for immunization: Secondary | ICD-10-CM

## 2020-02-22 DIAGNOSIS — N1831 Chronic kidney disease, stage 3a: Secondary | ICD-10-CM

## 2020-02-22 DIAGNOSIS — E1142 Type 2 diabetes mellitus with diabetic polyneuropathy: Secondary | ICD-10-CM

## 2020-02-22 DIAGNOSIS — IMO0002 Reserved for concepts with insufficient information to code with codable children: Secondary | ICD-10-CM

## 2020-02-22 DIAGNOSIS — E1165 Type 2 diabetes mellitus with hyperglycemia: Secondary | ICD-10-CM

## 2020-02-22 DIAGNOSIS — N183 Chronic kidney disease, stage 3 unspecified: Secondary | ICD-10-CM | POA: Insufficient documentation

## 2020-02-22 DIAGNOSIS — R600 Localized edema: Secondary | ICD-10-CM

## 2020-02-22 DIAGNOSIS — I519 Heart disease, unspecified: Secondary | ICD-10-CM

## 2020-02-22 DIAGNOSIS — N184 Chronic kidney disease, stage 4 (severe): Secondary | ICD-10-CM | POA: Insufficient documentation

## 2020-02-22 DIAGNOSIS — E785 Hyperlipidemia, unspecified: Secondary | ICD-10-CM

## 2020-02-22 DIAGNOSIS — R7309 Other abnormal glucose: Secondary | ICD-10-CM

## 2020-02-22 MED ORDER — LEVEMIR FLEXTOUCH 100 UNIT/ML ~~LOC~~ SOPN: 35.0000 [IU] | PEN_INJECTOR | Freq: Every day | SUBCUTANEOUS | 11 refills | Status: DC

## 2020-02-22 MED ORDER — FUROSEMIDE 20 MG PO TABS: 20.0000 mg | ORAL_TABLET | Freq: Every day | ORAL | 3 refills | Status: DC

## 2020-02-22 MED ORDER — AMLODIPINE BESYLATE 10 MG PO TABS: 10.0000 mg | ORAL_TABLET | Freq: Every day | ORAL | 3 refills | Status: DC

## 2020-02-22 MED ORDER — CARVEDILOL 12.5 MG PO TABS: 12.5000 mg | ORAL_TABLET | Freq: Two times a day (BID) | ORAL | 3 refills | Status: DC

## 2020-02-22 MED ORDER — TRUE METRIX METER W/DEVICE KIT: PACK | 0 refills | Status: DC

## 2020-02-22 MED ORDER — DILTIAZEM HCL ER COATED BEADS 120 MG PO CP24: 120.0000 mg | ORAL_CAPSULE | Freq: Every day | ORAL | 1 refills | Status: DC

## 2020-02-22 MED ORDER — SITAGLIPTIN PHOSPHATE 25 MG PO TABS: 25.0000 mg | ORAL_TABLET | Freq: Every day | ORAL | 6 refills | Status: DC

## 2020-02-22 MED ORDER — ATORVASTATIN CALCIUM 40 MG PO TABS: 20.0000 mg | ORAL_TABLET | Freq: Every day | ORAL | 1 refills | Status: DC

## 2020-02-22 MED ORDER — TRUEPLUS LANCETS 28G MISC: 1 refills | Status: DC

## 2020-02-22 MED ORDER — "PEN NEEDLES 1/2"" 29G X 12MM MISC": 1 refills | Status: DC

## 2020-02-22 MED ORDER — GLUCOSE BLOOD VI STRP: ORAL_STRIP | 12 refills | Status: DC

## 2020-02-22 MED FILL — TRUE METRIX TEST STRIP: 100 days supply | Qty: 100 | Fill #0

## 2020-02-22 MED FILL — FUROSEMIDE 20 MG TABS: 20 | 30 days supply | Qty: 30 | Fill #0

## 2020-02-22 MED FILL — TRUEplus LANCETS 28G MISC: 50 days supply | Qty: 100 | Fill #0

## 2020-02-22 NOTE — Assessment & Plan Note (Signed)
Uncontrolled hypertension and uncontrolled diabetes with normal foot exam  Patient needs a urine microalbumin and we will plan to increase Levemir to 35 units daily and obtain for the patient a FlexPen we will also discontinue Humalog and begin NovoLog 10 units twice daily with the largest meals of the day and discontinue further Metformin  I have also refilled the [place] to establish a visit with our clinical pharmacist and also refer the patient to medical dietary nutrition for diabetes

## 2020-02-22 NOTE — Progress Notes (Signed)
Patient has eaten today and taken medication today. CBG: 374 Patient has eaten hash brown, toast, grits and eggs, Patient took BP medications around 8:15am

## 2020-02-22 NOTE — Assessment & Plan Note (Signed)
History of left ventricular dysfunction and left bundle branch block  We will establish a follow-up appointment with cardiology

## 2020-02-22 NOTE — Assessment & Plan Note (Signed)
Stage IIIa chronic kidney disease followed by nephrology  We will obtain urine microalbumin and repeat metabolic profile this visit

## 2020-02-22 NOTE — Assessment & Plan Note (Signed)
Hyperlipidemia associated with diabetes  Increase atorvastatin to 40 mg daily

## 2020-02-22 NOTE — Patient Instructions (Addendum)
Start insulin levemir flex pen   35units daily  Start novolog flex pens  10 units twice a day with the two largest meals of the day  New meter will be provided.  Check you glucose twice a day  Referral to nutritionist, pharmacy , and financial counselor will be made  Refills and dose change on all medications made  Return to your primary care MD in one month   Diabetes Mellitus and Nutrition, Adult When you have diabetes (diabetes mellitus), it is very important to have healthy eating habits because your blood sugar (glucose) levels are greatly affected by what you eat and drink. Eating healthy foods in the appropriate amounts, at about the same times every day, can help you:  Control your blood glucose.  Lower your risk of heart disease.  Improve your blood pressure.  Reach or maintain a healthy weight. Every person with diabetes is different, and each person has different needs for a meal plan. Your health care provider may recommend that you work with a diet and nutrition specialist (dietitian) to make a meal plan that is best for you. Your meal plan may vary depending on factors such as:  The calories you need.  The medicines you take.  Your weight.  Your blood glucose, blood pressure, and cholesterol levels.  Your activity level.  Other health conditions you have, such as heart or kidney disease. How do carbohydrates affect me? Carbohydrates, also called carbs, affect your blood glucose level more than any other type of food. Eating carbs naturally raises the amount of glucose in your blood. Carb counting is a method for keeping track of how many carbs you eat. Counting carbs is important to keep your blood glucose at a healthy level, especially if you use insulin or take certain oral diabetes medicines. It is important to know how many carbs you can safely have in each meal. This is different for every person. Your dietitian can help you calculate how many carbs you should  have at each meal and for each snack. Foods that contain carbs include:  Bread, cereal, rice, pasta, and crackers.  Potatoes and corn.  Peas, beans, and lentils.  Milk and yogurt.  Fruit and juice.  Desserts, such as cakes, cookies, ice cream, and candy. How does alcohol affect me? Alcohol can cause a sudden decrease in blood glucose (hypoglycemia), especially if you use insulin or take certain oral diabetes medicines. Hypoglycemia can be a life-threatening condition. Symptoms of hypoglycemia (sleepiness, dizziness, and confusion) are similar to symptoms of having too much alcohol. If your health care provider says that alcohol is safe for you, follow these guidelines:  Limit alcohol intake to no more than 1 drink per day for nonpregnant women and 2 drinks per day for men. One drink equals 12 oz of beer, 5 oz of wine, or 1 oz of hard liquor.  Do not drink on an empty stomach.  Keep yourself hydrated with water, diet soda, or unsweetened iced tea.  Keep in mind that regular soda, juice, and other mixers may contain a lot of sugar and must be counted as carbs. What are tips for following this plan?  Reading food labels  Start by checking the serving size on the "Nutrition Facts" label of packaged foods and drinks. The amount of calories, carbs, fats, and other nutrients listed on the label is based on one serving of the item. Many items contain more than one serving per package.  Check the total grams (g) of  carbs in one serving. You can calculate the number of servings of carbs in one serving by dividing the total carbs by 15. For example, if a food has 30 g of total carbs, it would be equal to 2 servings of carbs.  Check the number of grams (g) of saturated and trans fats in one serving. Choose foods that have low or no amount of these fats.  Check the number of milligrams (mg) of salt (sodium) in one serving. Most people should limit total sodium intake to less than 2,300 mg per  day.  Always check the nutrition information of foods labeled as "low-fat" or "nonfat". These foods may be higher in added sugar or refined carbs and should be avoided.  Talk to your dietitian to identify your daily goals for nutrients listed on the label. Shopping  Avoid buying canned, premade, or processed foods. These foods tend to be high in fat, sodium, and added sugar.  Shop around the outside edge of the grocery store. This includes fresh fruits and vegetables, bulk grains, fresh meats, and fresh dairy. Cooking  Use low-heat cooking methods, such as baking, instead of high-heat cooking methods like deep frying.  Cook using healthy oils, such as olive, canola, or sunflower oil.  Avoid cooking with butter, cream, or high-fat meats. Meal planning  Eat meals and snacks regularly, preferably at the same times every day. Avoid going long periods of time without eating.  Eat foods high in fiber, such as fresh fruits, vegetables, beans, and whole grains. Talk to your dietitian about how many servings of carbs you can eat at each meal.  Eat 4-6 ounces (oz) of lean protein each day, such as lean meat, chicken, fish, eggs, or tofu. One oz of lean protein is equal to: ? 1 oz of meat, chicken, or fish. ? 1 egg. ?  cup of tofu.  Eat some foods each day that contain healthy fats, such as avocado, nuts, seeds, and fish. Lifestyle  Check your blood glucose regularly.  Exercise regularly as told by your health care provider. This may include: ? 150 minutes of moderate-intensity or vigorous-intensity exercise each week. This could be brisk walking, biking, or water aerobics. ? Stretching and doing strength exercises, such as yoga or weightlifting, at least 2 times a week.  Take medicines as told by your health care provider.  Do not use any products that contain nicotine or tobacco, such as cigarettes and e-cigarettes. If you need help quitting, ask your health care provider.  Work with  a Social worker or diabetes educator to identify strategies to manage stress and any emotional and social challenges. Questions to ask a health care provider  Do I need to meet with a diabetes educator?  Do I need to meet with a dietitian?  What number can I call if I have questions?  When are the best times to check my blood glucose? Where to find more information:  American Diabetes Association: diabetes.org  Academy of Nutrition and Dietetics: www.eatright.[employer] of Diabetes and Digestive and Kidney Diseases (NIH): [employer] Summary  A healthy meal plan will help you control your blood glucose and maintain a healthy lifestyle.  Working with a diet and nutrition specialist (dietitian) can help you make a meal plan that is best for you.  Keep in mind that carbohydrates (carbs) and alcohol have immediate effects on your blood glucose levels. It is important to count carbs and to use alcohol carefully. This information is not intended to replace  advice given to you by your health care provider. Make sure you discuss any questions you have with your health care provider. Document Revised: 04/18/2017 Document Reviewed: 06/10/2016 Elsevier Patient Education  2020 [employer].

## 2020-02-22 NOTE — Assessment & Plan Note (Signed)
Primary hypertension poorly controlled with subsequent left ventricular dysfunction  Plan will be to increase Coreg to 12-1/2 mg twice daily increase amlodipine to 10 mg daily continue furosemide daily continue diltiazem 120 mg daily

## 2020-02-22 NOTE — Progress Notes (Signed)
Subjective:    Patient ID: [name], male    DOB: 12-Feb-1971, 49 y.o.   MRN: [id number]  48 y.o.M here at mobile medicine unit to assess HTN and type 2 diabetes  The patient follows with the patient care center with Ms. Clovis Riley last seen in August.  He has had difficulty with medication adherence and dietary adherence.  He also states he has been inconsistent with the use of his insulin products.  Patient has history of left ventricular dysfunction and missed his most recent cardiology appointment.  Patient also needs a colonoscopy but this had to be canceled in May because of his blood pressure levels.  Note on arrival blood pressure was 186/102 and blood sugars 375.  Note last A1c was 12.9 as measured in May of this year and prior to that there was a 13 level measured.  The patient's diet has significant carbohydrate components and increase salt level components.  He apparently also sees nephrology for chronic kidney disease.  Patient does have polyphagia polydipsia polyuria.  He denies any numbness or tingling in the feet denies cardiac symptoms of chest pain.  See review of systems below     Past Medical History:  Diagnosis Date  . Anxiety 02/2019  . Anxiety 10/2019  . Diabetes mellitus type II, uncontrolled ([place]) 07/17/2006  . Elevated alkaline phosphatase level 01/2019  . ESSENTIAL HYPERTENSION 07/17/2006  . HYPERLIPIDEMIA 11/24/2008  . Insomnia 02/2019  . Left bundle branch block 02/22/2019  . Lesion of lip 10/2019  . Pneumonia 10/22/2011  . Suicidal ideations 02/2019  . Vitamin D deficiency 01/2019     Family History  Problem Relation Age of Onset  . Hypertension Mother 45  . Cancer Mother        bladder cancer  . Alcohol abuse Father   . Hypertension Brother      Social History   Socioeconomic History  . Marital status: Legally Separated    Spouse name: Not on file  . Number of children: Not on file  . Years of education: Not on file  . Highest education level:  Not on file  Occupational History  . 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 Occupation: Biochemist, clinical    Comment: Updated June 2013  . Occupation: Engineer, civil (consulting)    Comment: Began Jan 2014  Tobacco Use  . Smoking status: Never Smoker  . Smokeless tobacco: Never Used  Vaping Use  . Vaping Use: Never used  Substance and Sexual Activity  . Alcohol use: No    Alcohol/week: 0.0 standard drinks  . Drug use: No  . Sexual activity: Yes    Partners: Female  Other Topics Concern  . Not on file  Social History Narrative   Lives with his mother and brother.   Wife has had a foot amputation; is a type 1 DM.   Social Determinants of Health   Financial Resource Strain:   . Difficulty of Paying Living Expenses: Not on file  Food Insecurity:   . Worried About Charity fundraiser in the Last Year: Not on file  . Ran Out of Food in the Last Year: Not on file  Transportation Needs:   . Lack of Transportation (Medical): Not on file  . Lack of Transportation (Non-Medical): Not on file  Physical Activity:   . Days of Exercise per Week: Not on file  . Minutes of Exercise per Session: Not on file  Stress:   . Feeling of  Stress : Not on file  Social Connections:   . Frequency of Communication with Friends and Family: Not on file  . Frequency of Social Gatherings with Friends and Family: Not on file  . Attends Religious Services: Not on file  . Active Member of Clubs or Organizations: Not on file  . Attends Archivist Meetings: Not on file  . Marital Status: Not on file  Intimate Partner Violence:   . Fear of Current or Ex-Partner: Not on file  . Emotionally Abused: Not on file  . Physically Abused: Not on file  . Sexually Abused: Not on file     Allergies  Allergen Reactions  . Sulfa Antibiotics Rash    Severe rash. Do not give sulfa medications  . Bactrim [Sulfamethoxazole-Trimethoprim]     Rash  . Cephalexin     Patient with severe drug reaction including  exfoliating skin rash and hypotension after being prescribed both cephalexin and sulfamethoxazole-trimethoprim simultaneously. Favor SMX as much more likely culprit as patient had tolerated other cephalosporins in the past, but cannot say with complete certainty that this was not related to cephalexin.   . Other     Patient states he is allergic to the TB test     Outpatient Medications Prior to Visit  Medication Sig Dispense Refill  . Vitamin D, Ergocalciferol, (DRISDOL) 1.25 MG (50000 UNIT) CAPS capsule Take 50,000 Units by mouth once a week.    Marland Kitchen atorvastatin (LIPITOR) 20 MG tablet Take 1 tablet (20 mg total) by mouth daily. 90 tablet 3  . diltiazem (CARDIZEM CD) 120 MG 24 hr capsule Take 120 mg by mouth daily.    . furosemide (LASIX) 20 MG tablet Take 1 tablet (20 mg total) by mouth daily. 60 tablet 3  . glucose blood test strip Use to check fasting blood sugar once daily. 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 Insulin dependent 100 each 12  . HUMALOG 100 UNIT/ML injection SMARTSIG:20 Unit(s) SUB-Q Twice Daily    . Insulin Syringes, Disposable, U-100 0.3 ML MISC 100 Syringes by Does not apply route 3 (three) times daily with meals as needed. 100 each 6  . sitaGLIPtin (JANUVIA) 25 MG tablet Take 1 tablet (25 mg total) by mouth daily. 30 tablet 6  . sildenafil (VIAGRA) 25 MG tablet Take 1 tablet (25 mg total) by mouth as needed for erectile dysfunction. for erectile dysfunction. (Patient not taking: Reported on 02/04/2020) 30 tablet 3  . amLODipine (NORVASC) 5 MG tablet Take 1 tablet (5 mg total) by mouth daily. (Patient not taking: Reported on 02/22/2020) 180 tablet 3  . busPIRone (BUSPAR) 10 MG tablet Take 1 tablet (10 mg total) by mouth 2 (two) times daily. (Patient not taking: Reported on 02/22/2020) 60 tablet 3  . carvedilol (COREG) 6.25 MG tablet Take 6.25 mg by mouth 2 (two) times daily. (Patient not taking: Reported on 02/22/2020)    . insulin detemir (LEVEMIR) 100 UNIT/ML injection Inject 0.3 mLs (30 Units  total) into the skin daily. (Patient not taking: Reported on 02/22/2020) 10 mL 11  . metFORMIN (GLUCOPHAGE) 1000 MG tablet Take 1 tablet (1,000 mg total) by mouth 2 (two) times daily with a meal. (Patient not taking: Reported on 02/22/2020) 180 tablet 3   No facility-administered medications prior to visit.     Review of Systems  Constitutional: Positive for diaphoresis and fatigue.  Respiratory: Positive for chest tightness and shortness of breath. Negative for cough and wheezing.   Cardiovascular: Positive for chest pain and leg swelling. Negative  for palpitations.  Gastrointestinal: Positive for diarrhea. Negative for abdominal pain, nausea and vomiting.  Endocrine: Positive for polydipsia, polyphagia and polyuria.  Genitourinary: Negative for difficulty urinating and dysuria.  Musculoskeletal: Negative.   Skin: Negative for rash.  Neurological: Positive for dizziness, light-headedness and headaches. Negative for seizures and syncope.  Psychiatric/Behavioral: The patient is nervous/anxious.        Objective:   Physical Exam Vitals:   02/22/20 0940  BP: (!) 184/106  Pulse: 93  Resp: 18  Temp: (!) 97 F (36.1 C)  TempSrc: Oral  SpO2: 100%  Weight: 153 lb (69.4 kg)  Height: 5' 7"  (1.702 m)    Gen: Pleasant, well-nourished, in no distress,  normal affect  ENT: No lesions,  mouth clear,  oropharynx clear, no postnasal drip  Neck: No JVD, no TMG, no carotid bruits  Lungs: No use of accessory muscles, no dullness to percussion, clear without rales or rhonchi  Cardiovascular: RRR, heart sounds normal, no murmur or gallops, 2+ peripheral edema  Abdomen: soft and NT, no HSM,  BS normal  Musculoskeletal: No deformities, no cyanosis or clubbing  Neuro: alert, non focal  Skin: Warm, no lesions or rashes  Foot exam was normal Lab Results  Component Value Date   HGBA1C 12.5 (A) 01/17/2020   HGBA1C 12.5 01/17/2020   HGBA1C 12.5 (A) 01/17/2020   HGBA1C 12.4 (A) 01/17/2020   A1C 13.5 in June 2021  CBC Latest Ref Rng & Units 06/05/2019 04/07/2019 02/19/2019  WBC 4.0 - 10.5 K/uL 6.8 7.7 6.0  Hemoglobin 13.0 - 17.0 g/dL 10.6(L) 12.4(L) 12.4(L)  Hematocrit 39 - 52 % 32.1(L) 37.7(L) 39.0  Platelets 150 - 400 K/uL 412(H) 321 373   BMP Latest Ref Rng & Units 10/06/2019 06/05/2019 04/07/2019  Glucose 65 - 99 mg/dL 199(H) 484(H) 363(H)  BUN 6 - 24 mg/dL 21 29(H) 26(H)  Creatinine 0.76 - 1.27 mg/dL 1.67(H) 2.05(H) 1.45(H)  BUN/Creat Ratio 9 - 20 13 - -  Sodium 134 - 144 mmol/L 141 130(L) 138  Potassium 3.5 - 5.2 mmol/L 3.7 5.6(H) 3.8  Chloride 96 - 106 mmol/L 104 91(L) 99  CO2 22 - 32 mmol/L - 28 28  Calcium 8.7 - 10.2 mg/dL 8.5(L) 8.1(L) 9.3   Lipid Panel     Component Value Date/Time   CHOL 176 07/27/2019 1022   TRIG 100 07/27/2019 1022   HDL 44 07/27/2019 1022   CHOLHDL 4.0 07/27/2019 1022   CHOLHDL 4.3 09/06/2014 1657   VLDL 38 09/06/2014 1657   LDLCALC 114 (H) 07/27/2019 1022   LABVLDL 18 07/27/2019 1022   Hepatic Function Latest Ref Rng & Units 10/06/2019 07/27/2019 06/05/2019  Total Protein 6.0 - 8.5 g/dL 5.7(L) 6.1 RESULTS UNAVAILABLE DUE TO INTERFERING SUBSTANCE  Albumin 4.0 - 5.0 g/dL 3.2(L) 3.2(L) 2.1(L)  AST 0 - 40 IU/L 21 19 50(H)  ALT 0 - 44 IU/L - 7 25  Alk Phosphatase 48 - 121 IU/L 151(H) 115 105  Total Bilirubin 0.0 - 1.2 mg/dL <0.2 <0.2 1.0  Bilirubin, Direct 0.00 - 0.40 mg/dL - 0.09 -          Assessment & Plan:  I personally reviewed all images and lab data in the [facility] system as well as any outside material available during this office visit and agree with the  radiology impressions.   Essential hypertension Primary hypertension poorly controlled with subsequent left ventricular dysfunction  Plan will be to increase Coreg to 12-1/2 mg twice daily increase amlodipine to 10 mg daily  continue furosemide daily continue diltiazem 120 mg daily    Left ventricular dysfunction History of left ventricular dysfunction and left bundle  branch block  We will establish a follow-up appointment with cardiology  Uncontrolled type 2 diabetes mellitus with peripheral neuropathy ([place]) Uncontrolled hypertension and uncontrolled diabetes with normal foot exam  Patient needs a urine microalbumin and we will plan to increase Levemir to 35 units daily and obtain for the patient a FlexPen we will also discontinue Humalog and begin NovoLog 10 units twice daily with the largest meals of the day and discontinue further Metformin  I have also refilled the [place] to establish a visit with our clinical pharmacist and also refer the patient to medical dietary nutrition for diabetes  Hyperlipidemia LDL goal <70 Hyperlipidemia associated with diabetes  Increase atorvastatin to 40 mg daily  CKD (chronic kidney disease) stage 3, GFR 30-59 ml/min (HCC) Stage IIIa chronic kidney disease followed by nephrology  We will obtain urine microalbumin and repeat metabolic profile this visit   Cayton was seen today for blood pressure check.  Diagnoses and all orders for this visit:  Left ventricular dysfunction -     Ambulatory referral to Cardiology  Uncontrolled type 2 diabetes mellitus with peripheral neuropathy ([place]) -     glucose blood test strip; Use to check fasting blood sugar once daily. 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 Insulin dependent -     sitaGLIPtin (JANUVIA) 25 MG tablet; Take 1 tablet (25 mg total) by mouth daily. -     Amb ref to Medical Nutrition Therapy-MNT -     Comprehensive metabolic panel -     CBC with Differential/Platelet; Future -     Microalbumin / creatinine urine ratio -     CBC with Differential/Platelet  Hyperlipidemia LDL goal <70 -     atorvastatin (LIPITOR) 40 MG tablet; Take 0.5 tablets (20 mg total) by mouth daily. -     Comprehensive metabolic panel  Edema of both lower extremities Comments: R >L  Orders: -     furosemide (LASIX) 20 MG tablet; Take 1 tablet (20 mg total) by mouth daily.  Hemoglobin  A1C greater than 9%, indicating poor diabetic control -     sitaGLIPtin (JANUVIA) 25 MG tablet; Take 1 tablet (25 mg total) by mouth daily. -     Amb ref to Medical Nutrition Therapy-MNT  Hyperglycemia -     sitaGLIPtin (JANUVIA) 25 MG tablet; Take 1 tablet (25 mg total) by mouth daily.  Need for immunization against influenza -     Flu Vaccine QUAD 36+ mos IM  Essential hypertension  Stage 3a chronic kidney disease ([place])  Other orders -     diltiazem (CARDIZEM CD) 120 MG 24 hr capsule; Take 1 capsule (120 mg total) by mouth daily. -     amLODipine (NORVASC) 10 MG tablet; Take 1 tablet (10 mg total) by mouth daily. -     carvedilol (COREG) 12.5 MG tablet; Take 1 tablet (12.5 mg total) by mouth 2 (two) times daily. -     insulin detemir (LEVEMIR FLEXTOUCH) 100 UNIT/ML FlexPen; Inject 35 Units into the skin daily. -     Insulin Pen Needle (PEN NEEDLES 29GX1/2") 29G X 12MM MISC; Use with insulin pens -     Blood Glucose Monitoring Suppl (TRUE METRIX METER) w/Device KIT; Use to measure blood sugar twice a day -     TRUEplus Lancets 28G MISC; Use to measure blood sugar twice a day -  Tdap vaccine greater than or equal to 7yo IM  Note a tetanus vaccine and flu vaccine was given at this visit  Patient given application for financial assistance and will have a financial counselor appointment made patient was inquiring about an insulin pump I told him this is not available until he gets financial assistance and we get him to endocrinology  Note the patient previously received the [place] vaccine earlier this year will need a booster at some point this fall

## 2020-02-23 ENCOUNTER — Other Ambulatory Visit: Payer: Self-pay | Admitting: Critical Care Medicine

## 2020-02-23 DIAGNOSIS — E785 Hyperlipidemia, unspecified: Secondary | ICD-10-CM

## 2020-02-23 LAB — CBC WITH DIFFERENTIAL/PLATELET
Basophils Absolute: 0 10*3/uL (ref 0.0–0.2)
Basos: 1 %
EOS (ABSOLUTE): 0.2 10*3/uL (ref 0.0–0.4)
Eos: 3 %
Hematocrit: 36.3 % — ABNORMAL LOW (ref 37.5–51.0)
Hemoglobin: 11.7 g/dL — ABNORMAL LOW (ref 13.0–17.7)
Immature Grans (Abs): 0 10*3/uL (ref 0.0–0.1)
Immature Granulocytes: 0 %
Lymphocytes Absolute: 2 10*3/uL (ref 0.7–3.1)
Lymphs: 27 %
MCH: 27.1 pg (ref 26.6–33.0)
MCHC: 32.2 g/dL (ref 31.5–35.7)
MCV: 84 fL (ref 79–97)
Monocytes Absolute: 0.4 10*3/uL (ref 0.1–0.9)
Monocytes: 6 %
Neutrophils Absolute: 4.7 10*3/uL (ref 1.4–7.0)
Neutrophils: 63 %
Platelets: 421 10*3/uL (ref 150–450)
RBC: 4.32 x10E6/uL (ref 4.14–5.80)
RDW: 12.5 % (ref 11.6–15.4)
WBC: 7.4 10*3/uL (ref 3.4–10.8)

## 2020-02-23 LAB — COMPREHENSIVE METABOLIC PANEL
ALT: 13 IU/L (ref 0–44)
AST: 20 IU/L (ref 0–40)
Albumin/Globulin Ratio: 0.9 — ABNORMAL LOW (ref 1.2–2.2)
Albumin: 2.7 g/dL — ABNORMAL LOW (ref 4.0–5.0)
Alkaline Phosphatase: 183 IU/L — ABNORMAL HIGH (ref 44–121)
BUN/Creatinine Ratio: 15 (ref 9–20)
BUN: 30 mg/dL — ABNORMAL HIGH (ref 6–24)
Bilirubin Total: <0.2 mg/dL (ref 0.0–1.2)
CO2: 24 mmol/L (ref 20–29)
Calcium: 8 mg/dL — ABNORMAL LOW (ref 8.7–10.2)
Chloride: 98 mmol/L (ref 96–106)
Creatinine, Ser: 1.94 mg/dL — ABNORMAL HIGH (ref 0.76–1.27)
GFR calc Af Amer: 47 mL/min/{1.73_m2} — ABNORMAL LOW (ref >59)
GFR calc non Af Amer: 40 mL/min/{1.73_m2} — ABNORMAL LOW (ref >59)
Globulin, Total: 3 g/dL (ref 1.5–4.5)
Glucose: 398 mg/dL — ABNORMAL HIGH (ref 65–99)
Potassium: 4.3 mmol/L (ref 3.5–5.2)
Sodium: 134 mmol/L (ref 134–144)
Total Protein: 5.7 g/dL — ABNORMAL LOW (ref 6.0–8.5)

## 2020-02-23 LAB — MICROALBUMIN / CREATININE URINE RATIO
Creatinine, Urine: 58.2 mg/dL
Microalb/Creat Ratio: 5728 mg/g creat — ABNORMAL HIGH (ref 0–29)
Microalbumin, Urine: 3333.7 ug/mL

## 2020-02-23 LAB — SPECIMEN STATUS REPORT

## 2020-02-23 MED ORDER — ATORVASTATIN CALCIUM 40 MG PO TABS: 40.0000 mg | ORAL_TABLET | Freq: Every day | ORAL | 1 refills | Status: DC

## 2020-02-23 MED ORDER — NOVOLOG FLEXPEN 100 UNIT/ML ~~LOC~~ SOPN: 10.0000 [IU] | PEN_INJECTOR | Freq: Two times a day (BID) | SUBCUTANEOUS | 2 refills | Status: DC

## 2020-02-23 MED FILL — ?DILTIAZEM 24HR CD 120MG CA: 120 | 30 days supply | Qty: 30 | Fill #0

## 2020-02-23 MED FILL — TRUE METRIX GO GLUCOSE METE: W/DEVICE | 30 days supply | Qty: 1 | Fill #0

## 2020-02-23 MED FILL — !NOVOLOG FLEXPEN SYRINGE 1: 100/ML | 30 days supply | Qty: 9 | Fill #0

## 2020-02-23 MED FILL — CARVEDILOL 12.5 MG TABLET: 12.5 | 30 days supply | Qty: 60 | Fill #0

## 2020-02-23 MED FILL — ?AMLODIPINE BESYLATE 5MG TA: 5 | 30 days supply | Qty: 60 | Fill #0

## 2020-02-23 MED FILL — ?ATORVASTATIN 40MG TABLET: 40 | 30 days supply | Qty: 30 | Fill #0

## 2020-02-23 MED FILL — !LEVEMIR FLEXTOUCH 100 UNIT: 100 | 25 days supply | Qty: 9 | Fill #0

## 2020-02-23 NOTE — Progress Notes (Signed)
meds changed  

## 2020-02-28 ENCOUNTER — Ambulatory Visit: Payer: Self-pay | Admitting: Pharmacist

## 2020-03-02 ENCOUNTER — Ambulatory Visit: Payer: Self-pay

## 2020-03-04 ENCOUNTER — Emergency Department (HOSPITAL_COMMUNITY)
Admission: EM | Admit: 2020-03-04 | Discharge: 2020-03-04 | Disposition: A | Discharge status: Home / Self Care | Payer: Self-pay | Attending: Emergency Medicine | Admitting: Emergency Medicine

## 2020-03-04 ENCOUNTER — Other Ambulatory Visit: Payer: Self-pay

## 2020-03-04 ENCOUNTER — Encounter (HOSPITAL_COMMUNITY): Payer: Self-pay | Admitting: Emergency Medicine

## 2020-03-04 DIAGNOSIS — Z5321 Procedure and treatment not carried out due to patient leaving prior to being seen by health care provider: Secondary | ICD-10-CM | POA: Insufficient documentation

## 2020-03-04 DIAGNOSIS — M25561 Pain in right knee: Secondary | ICD-10-CM | POA: Insufficient documentation

## 2020-03-04 DIAGNOSIS — M25562 Pain in left knee: Secondary | ICD-10-CM | POA: Insufficient documentation

## 2020-03-04 NOTE — ED Triage Notes (Signed)
Patient reports bilateral knee pain onset last week , denies injury , ambulatory .

## 2020-03-04 NOTE — ED Notes (Signed)
Patient refuses vitals

## 2020-03-07 ENCOUNTER — Ambulatory Visit (INDEPENDENT_AMBULATORY_CARE_PROVIDER_SITE_OTHER): Payer: Self-pay | Admitting: Cardiovascular Disease

## 2020-03-07 ENCOUNTER — Encounter: Payer: Self-pay | Admitting: Cardiovascular Disease

## 2020-03-07 ENCOUNTER — Other Ambulatory Visit: Payer: Self-pay

## 2020-03-07 ENCOUNTER — Other Ambulatory Visit: Payer: Self-pay | Admitting: Cardiovascular Disease

## 2020-03-07 VITALS — BP 176/90 | HR 94 | Ht 67.0 in | Wt 160.0 lb

## 2020-03-07 DIAGNOSIS — Z79899 Other long term (current) drug therapy: Secondary | ICD-10-CM

## 2020-03-07 DIAGNOSIS — I519 Heart disease, unspecified: Secondary | ICD-10-CM

## 2020-03-07 DIAGNOSIS — I10 Essential (primary) hypertension: Secondary | ICD-10-CM

## 2020-03-07 DIAGNOSIS — R600 Localized edema: Secondary | ICD-10-CM

## 2020-03-07 DIAGNOSIS — E785 Hyperlipidemia, unspecified: Secondary | ICD-10-CM

## 2020-03-07 MED ORDER — CARVEDILOL 25 MG PO TABS: 25.0000 mg | ORAL_TABLET | Freq: Two times a day (BID) | ORAL | 3 refills | Status: DC

## 2020-03-07 MED ORDER — HYDRALAZINE HCL 25 MG PO TABS: 25.0000 mg | ORAL_TABLET | Freq: Three times a day (TID) | ORAL | 3 refills | Status: DC

## 2020-03-07 MED ORDER — FUROSEMIDE 40 MG PO TABS: 40.0000 mg | ORAL_TABLET | Freq: Every day | ORAL | 3 refills | Status: DC

## 2020-03-07 NOTE — Patient Instructions (Addendum)
Medication Instructions:   INCREASE CARVEDILOL 25 MG 2 TIMES A DAY   INCREASE LASIX TO 40 MG DAILY  START HYDRALAZINE 25 MG 3 TIMES A DAY  STOP DILTIAZEM  *If you need a refill on your cardiac medications before your next appointment, please call your pharmacy*  Lab Work: Your physician recommends that you return for lab work in: [place]  BMET  If you have labs (blood work) drawn today and your tests are completely normal, you will receive your results only by: Marland Kitchen MyChart Message (if you have MyChart) OR . A paper copy in the mail If you have any lab test that is abnormal or we need to change your treatment, we will call you to review the results.  Testing/Procedures: Your physician has requested that you have an echocardiogram. Echocardiography is a painless test that uses sound waves to create images of your heart. It provides your doctor with information about the size and shape of your heart and how well your heart's chambers and valves are working. This procedure takes approximately one hour. There are no restrictions for this procedure.   Please schedule for 1 month    Follow-Up: At [facility], you and your health needs are our priority.  As part of our continuing mission to provide you with exceptional heart care, we have created designated Provider Care Teams.  These Care Teams include your primary Cardiologist (physician) and Advanced Practice Providers (APPs -  Physician Assistants and Nurse Practitioners) who all work together to provide you with the care you need, when you need it.  Your next appointment:   1 month  3 month(s)  The format for your next appointment:   In Person  In Person  Provider:   Vena Austria  You will see one of the following Advanced Practice Providers on your designated Care Team:    Kerin Ransom, PA-C  [place], [place]  Coletta Memos, FNP  Then, Quay Burow, MD will plan to see you again in 6 month(s).  Other  Instructions  You have been referred to Heart Failure Clinic   MONITOR BLOOD PRESSURE FOR 30 DAYS

## 2020-03-07 NOTE — Assessment & Plan Note (Signed)
History of moderate LV dysfunction by 2D echo performed 03/19/2019 with an EF of 40 to 45% probably related to hypertensive heart disease.

## 2020-03-07 NOTE — Assessment & Plan Note (Signed)
2+ bilateral lower extreme edema on furosemide 20 mg a day. Serum creatinine is close to 2. I am going to increase his furosemide from 20 to 40 mg a day.

## 2020-03-07 NOTE — Progress Notes (Signed)
03/07/2020 NIKKI RUSNAK   05-30-1970  [id number]  Primary Physician [name], FNP Primary Cardiologist: Lorretta Harp MD Lupe Carney, [place]  HPI:  [name] is a 49 y.o.  thin appearing married African-American male father of 2 living children (1 deceased) with no grandchildren who is currentlycurrently works as a Scientist, water quality at [employer].He was referred by Kathe Becton, FNP for evaluation of dyspnea on exertion. I last saw him in the office 07/27/2019.  He has a history of treated hypertension, hyperlipidemia and diabetes. There is no family history for heart disease. Is never had a heartattackor stroke. He doeshave chronic left bundle branch block. He gets dyspneic especially while having sexual intercourse.  He was admitted to the hospital for hypertensive urgency 04/07/2019 after a disagreement with his fiancs Jocelyn. His enzymes were fairly low. He did have a coronary calcium score which was 0 suggesting his chest pain was noncardiac. He admits to diet dietary discretion with regards to salt and medication noncompliance. A 2D echo performed 03/19/2019 revealed an EF of 40 to 45%, significantly reduced compared to the previous echo performed 01/02/2016 which showed a normal EF.  He did develop COVID-19 in mid January and was quarantined for 2 weeks.  He recovered from this.  He denies chest pain or shortness of breath.  He has been taking his antihypertensive medications and has avoided salt.  Since I saw him in the office 6 months ago he has noticed lower extremity edema. His blood pressure has been poorly controlled. He denies chest pain or shortness of breath.    Current Meds  Medication Sig  . amLODipine (NORVASC) 10 MG tablet Take 1 tablet (10 mg total) by mouth daily.  Marland Kitchen atorvastatin (LIPITOR) 40 MG tablet Take 1 tablet (40 mg total) by mouth daily.  . Blood Glucose Monitoring Suppl (TRUE METRIX METER) w/Device KIT Use to measure blood  sugar twice a day  . carvedilol (COREG) 12.5 MG tablet Take 1 tablet (12.5 mg total) by mouth 2 (two) times daily.  . furosemide (LASIX) 20 MG tablet Take 1 tablet (20 mg total) by mouth daily.  Marland Kitchen glucose blood test strip Use to check fasting blood sugar once daily. 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 Insulin dependent  . insulin aspart (NOVOLOG FLEXPEN) 100 UNIT/ML FlexPen Inject 10 Units into the skin 2 (two) times daily with a meal.  . insulin detemir (LEVEMIR FLEXTOUCH) 100 UNIT/ML FlexPen Inject 35 Units into the skin daily.  . Insulin Pen Needle (PEN NEEDLES 29GX1/2") 29G X 12MM MISC Use with insulin pens  . sildenafil (VIAGRA) 25 MG tablet Take 1 tablet (25 mg total) by mouth as needed for erectile dysfunction. for erectile dysfunction.  . sitaGLIPtin (JANUVIA) 25 MG tablet Take 1 tablet (25 mg total) by mouth daily.  . TRUEplus Lancets 28G MISC Use to measure blood sugar twice a day  . Vitamin D, Ergocalciferol, (DRISDOL) 1.25 MG (50000 UNIT) CAPS capsule Take 50,000 Units by mouth once a week.  . [DISCONTINUED] diltiazem (CARDIZEM CD) 120 MG 24 hr capsule Take 1 capsule (120 mg total) by mouth daily.     Allergies  Allergen Reactions  . Sulfa Antibiotics Rash    Severe rash. Do not give sulfa medications  . Bactrim [Sulfamethoxazole-Trimethoprim]     Rash  . Cephalexin     Patient with severe drug reaction including exfoliating skin rash and hypotension after being prescribed both cephalexin and sulfamethoxazole-trimethoprim simultaneously. Favor SMX as much more likely  culprit as patient had tolerated other cephalosporins in the past, but cannot say with complete certainty that this was not related to cephalexin.   . Other     Patient states he is allergic to the TB test    Social History   Socioeconomic History  . Marital status: Legally Separated    Spouse name: Not on file  . Number of children: Not on file  . Years of education: Not on file  . Highest education level: Not on file    Occupational History  . 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 Occupation: Biochemist, clinical    Comment: Updated June 2013  . Occupation: Engineer, civil (consulting)    Comment: Began Jan 2014  Tobacco Use  . Smoking status: Never Smoker  . Smokeless tobacco: Never Used  Vaping Use  . Vaping Use: Never used  Substance and Sexual Activity  . Alcohol use: No    Alcohol/week: 0.0 standard drinks  . Drug use: No  . Sexual activity: Yes    Partners: Female  Other Topics Concern  . Not on file  Social History Narrative   Lives with his mother and brother.   Wife has had a foot amputation; is a type 1 DM.   Social Determinants of Health   Financial Resource Strain:   . Difficulty of Paying Living Expenses: Not on file  Food Insecurity:   . Worried About Charity fundraiser in the Last Year: Not on file  . Ran Out of Food in the Last Year: Not on file  Transportation Needs:   . Lack of Transportation (Medical): Not on file  . Lack of Transportation (Non-Medical): Not on file  Physical Activity:   . Days of Exercise per Week: Not on file  . Minutes of Exercise per Session: Not on file  Stress:   . Feeling of Stress : Not on file  Social Connections:   . Frequency of Communication with Friends and Family: Not on file  . Frequency of Social Gatherings with Friends and Family: Not on file  . Attends Religious Services: Not on file  . Active Member of Clubs or Organizations: Not on file  . Attends Archivist Meetings: Not on file  . Marital Status: Not on file  Intimate Partner Violence:   . Fear of Current or Ex-Partner: Not on file  . Emotionally Abused: Not on file  . Physically Abused: Not on file  . Sexually Abused: Not on file     Review of Systems: General: negative for chills, fever, night sweats or weight changes.  Cardiovascular: negative for chest pain, dyspnea on exertion, edema, orthopnea, palpitations, paroxysmal nocturnal dyspnea or shortness of  breath Dermatological: negative for rash Respiratory: negative for cough or wheezing Urologic: negative for hematuria Abdominal: negative for nausea, vomiting, diarrhea, bright red blood per rectum, melena, or hematemesis Neurologic: negative for visual changes, syncope, or dizziness All other systems reviewed and are otherwise negative except as noted above.    Blood pressure (!) 176/90, pulse 94, height _0  (1.702 m), weight 160 lb (72.6 kg), SpO2 94 %.  General appearance: alert and no distress Neck: no adenopathy, no carotid bruit, no JVD, supple, symmetrical, trachea midline and thyroid not enlarged, symmetric, no tenderness/mass/nodules Lungs: clear to auscultation bilaterally Heart: regular rate and rhythm, S1, S2 normal, no murmur, click, rub or gallop Extremities: 2+ lower extremity edema Pulses: 2+ and symmetric Skin: Skin color, texture, turgor normal. No rashes or lesions  Neurologic: Alert and oriented X 3, normal strength and tone. Normal symmetric reflexes. Normal coordination and gait  EKG sinus rhythm at 94 with left bundle branch block. I personally reviewed this EKG.  ASSESSMENT AND PLAN:   Hyperlipidemia LDL goal <70 History of hyperlipidemia on statin therapy with lipid profile performed 07/27/2019 revealing total cholesterol 176, LDL 114 HDL 44.  Essential hypertension History of essential hypertension a blood pressure measured today at 176/90. He had a similar blood pressure reading yesterday with a diastolic of 032. He is on amlodipine, carvedilol and diltiazem. He is not on an ACE/ARB because of moderate renal insufficiency. Okay to discontinue his diltiazem, uptitrate his carvedilol and add hydralazine. We will have him keep a 30-day blood pressure log and see a Pharm.D. after that for further review and titration.  Left ventricular dysfunction History of moderate LV dysfunction by 2D echo performed 03/19/2019 with an EF of 40 to 45% probably related to  hypertensive heart disease.  Lower extremity edema 2+ bilateral lower extreme edema on furosemide 20 mg a day. Serum creatinine is close to 2. I am going to increase his furosemide from 20 to 40 mg a day.      Lorretta Harp MD FACP,FACC,FAHA, [facility] 03/07/2020 4:19 PM

## 2020-03-07 NOTE — Assessment & Plan Note (Signed)
History of hyperlipidemia on statin therapy with lipid profile performed 07/27/2019 revealing total cholesterol 176, LDL 114 HDL 44.

## 2020-03-07 NOTE — Assessment & Plan Note (Signed)
History of essential hypertension a blood pressure measured today at 176/90. He had a similar blood pressure reading yesterday with a diastolic of 500. He is on amlodipine, carvedilol and diltiazem. He is not on an ACE/ARB because of moderate renal insufficiency. Okay to discontinue his diltiazem, uptitrate his carvedilol and add hydralazine. We will have him keep a 30-day blood pressure log and see a Pharm.D. after that for further review and titration.

## 2020-03-07 NOTE — Addendum Note (Signed)
Addended by: Jacqulynn Cadet on: 03/07/2020 05:14 PM   Modules accepted: Orders

## 2020-03-08 ENCOUNTER — Ambulatory Visit (INDEPENDENT_AMBULATORY_CARE_PROVIDER_SITE_OTHER): Payer: Self-pay | Admitting: Family Medicine

## 2020-03-08 ENCOUNTER — Encounter: Payer: Self-pay | Admitting: Family Medicine

## 2020-03-08 VITALS — BP 170/89 | HR 98 | Temp 98.1°F | Ht 67.0 in | Wt 157.0 lb

## 2020-03-08 DIAGNOSIS — R600 Localized edema: Secondary | ICD-10-CM

## 2020-03-08 DIAGNOSIS — E1165 Type 2 diabetes mellitus with hyperglycemia: Secondary | ICD-10-CM

## 2020-03-08 DIAGNOSIS — IMO0002 Reserved for concepts with insufficient information to code with codable children: Secondary | ICD-10-CM

## 2020-03-08 DIAGNOSIS — I10 Essential (primary) hypertension: Secondary | ICD-10-CM

## 2020-03-08 DIAGNOSIS — Z09 Encounter for follow-up examination after completed treatment for conditions other than malignant neoplasm: Secondary | ICD-10-CM

## 2020-03-08 DIAGNOSIS — Z9119 Patient's noncompliance with other medical treatment and regimen: Secondary | ICD-10-CM

## 2020-03-08 DIAGNOSIS — Z91199 Patient's noncompliance with other medical treatment and regimen due to unspecified reason: Secondary | ICD-10-CM

## 2020-03-08 DIAGNOSIS — E1142 Type 2 diabetes mellitus with diabetic polyneuropathy: Secondary | ICD-10-CM

## 2020-03-08 DIAGNOSIS — R7309 Other abnormal glucose: Secondary | ICD-10-CM

## 2020-03-08 DIAGNOSIS — F419 Anxiety disorder, unspecified: Secondary | ICD-10-CM

## 2020-03-08 MED FILL — ?FUROSEMIDE 4OMG TABLETS: 40 | 30 days supply | Qty: 30 | Fill #0

## 2020-03-08 MED FILL — hydrALAZINE HCL 25 MG TABS: 25 | 30 days supply | Qty: 90 | Fill #0

## 2020-03-08 MED FILL — CARVEDILOL 25 MG TABLET: 25 | 30 days supply | Qty: 60 | Fill #0

## 2020-03-08 NOTE — Progress Notes (Signed)
[facility]   Established Patient Office Visit  Subjective:  Patient ID: [name], male    DOB: October 31, 1970  Age: 49 y.o. MRN: [id number]  CC:  Chief Complaint  Patient presents with  . Follow-up    Pt states his feet and legs are still swollen. X2-3 wks    HPI [name] is a 49 year old male who presents for Follow Up today.   Patient Active Problem List   Diagnosis Date Noted  . Lower extremity edema 03/07/2020  . CKD (chronic kidney disease) stage 3, GFR 30-59 ml/min (HCC) 02/22/2020  . Left ventricular dysfunction 04/27/2019  . Hemoglobin A1C greater than 9%, indicating poor diabetic control 07/29/2018  . New onset left bundle branch block (LBBB)   . 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 Hyperlipidemia LDL goal <70 11/24/2008  . Uncontrolled type 2 diabetes mellitus with peripheral neuropathy ([place]) 07/17/2006  . Essential hypertension 07/17/2006   Current Status: Since is last office visit, he is doing well with no complaints. He recently followed up with Cardiology, which discontinues Diltazem and increased Carvidelol to 25 mg. He denies visual changes, chest pain, cough, shortness of breath, heart palpitations, and falls. He has occasional headaches and dizziness with position changes. Denies severe headaches, confusion, seizures, double vision, and blurred vision, nausea and vomiting. He will continue medication as prescribed, to decrease foods/beverages high in sugars and carbs and follow Heart Healthy or DASH diet. Increase physical activity to at least 30 minutes cardio exercise daily. He denies fevers, chills, fatigue, recent infections, weight loss, and night sweats. Denies GI problems such as diarrhea, and constipation. He has no reports of blood in stools, dysuria and hematuria. No depression or anxiety reported today. He is taking all medications as prescribed. He denies pain today.    Past  Medical History:  Diagnosis Date  . Anxiety 02/2019  . Anxiety 10/2019  . Diabetes mellitus type II, uncontrolled ([place]) 07/17/2006  . Elevated alkaline phosphatase level 01/2019  . ESSENTIAL HYPERTENSION 07/17/2006  . HYPERLIPIDEMIA 11/24/2008  . Insomnia 02/2019  . Left bundle branch block 02/22/2019  . Lesion of lip 10/2019  . Pneumonia 10/22/2011  . Suicidal ideations 02/2019  . Vitamin D deficiency 01/2019    Past Surgical History:  Procedure Laterality Date  . ARM SURGERY      Family History  Problem Relation Age of Onset  . Hypertension Mother 85  . Cancer Mother        bladder cancer  . Alcohol abuse Father   . Hypertension Brother     Social History   Socioeconomic History  . Marital status: Legally Separated    Spouse name: Not on file  . Number of children: Not on file  . Years of education: Not on file  . Highest education level: Not on file  Occupational History  . 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 Occupation: Biochemist, clinical    Comment: Updated June 2013  . Occupation: Engineer, civil (consulting)    Comment: Began Jan 2014  Tobacco Use  . Smoking status: Never Smoker  . Smokeless tobacco: Never Used  Vaping Use  . Vaping Use: Never used  Substance and Sexual Activity  . Alcohol use: No    Alcohol/week: 0.0 standard drinks  . Drug use: No  . Sexual activity: Yes    Partners: Female  Other Topics Concern  . Not on file  Social  History Narrative   Lives with his mother and brother.   Wife has had a foot amputation; is a type 1 DM.   Social Determinants of Health   Financial Resource Strain:   . Difficulty of Paying Living Expenses: Not on file  Food Insecurity:   . Worried About Charity fundraiser in the Last Year: Not on file  . Ran Out of Food in the Last Year: Not on file  Transportation Needs:   . Lack of Transportation (Medical): Not on file  . Lack of Transportation (Non-Medical): Not on file  Physical Activity:   . Days of Exercise per  Week: Not on file  . Minutes of Exercise per Session: Not on file  Stress:   . Feeling of Stress : Not on file  Social Connections:   . Frequency of Communication with Friends and Family: Not on file  . Frequency of Social Gatherings with Friends and Family: Not on file  . Attends Religious Services: Not on file  . Active Member of Clubs or Organizations: Not on file  . Attends Archivist Meetings: Not on file  . Marital Status: Not on file  Intimate Partner Violence:   . Fear of Current or Ex-Partner: Not on file  . Emotionally Abused: Not on file  . Physically Abused: Not on file  . Sexually Abused: Not on file    Outpatient Medications Prior to Visit  Medication Sig Dispense Refill  . amLODipine (NORVASC) 10 MG tablet Take 1 tablet (10 mg total) by mouth daily. 90 tablet 3  . atorvastatin (LIPITOR) 40 MG tablet Take 1 tablet (40 mg total) by mouth daily. 90 tablet 1  . Blood Glucose Monitoring Suppl (TRUE METRIX METER) w/Device KIT Use to measure blood sugar twice a day 1 kit 0  . carvedilol (COREG) 25 MG tablet Take 1 tablet (25 mg total) by mouth 2 (two) times daily. 180 tablet 3  . furosemide (LASIX) 40 MG tablet Take 1 tablet (40 mg total) by mouth daily. 90 tablet 3  . glucose blood test strip Use to check fasting blood sugar once daily. 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 Insulin dependent 100 each 12  . hydrALAZINE (APRESOLINE) 25 MG tablet Take 1 tablet (25 mg total) by mouth 3 (three) times daily. 270 tablet 3  . insulin aspart (NOVOLOG FLEXPEN) 100 UNIT/ML FlexPen Inject 10 Units into the skin 2 (two) times daily with a meal. 15 mL 2  . insulin detemir (LEVEMIR FLEXTOUCH) 100 UNIT/ML FlexPen Inject 35 Units into the skin daily. 15 mL 11  . Insulin Pen Needle (PEN NEEDLES 29GX1/2") 29G X 12MM MISC Use with insulin pens 100 each 1  . sildenafil (VIAGRA) 25 MG tablet Take 1 tablet (25 mg total) by mouth as needed for erectile dysfunction. for erectile dysfunction. 30 tablet 3  .  sitaGLIPtin (JANUVIA) 25 MG tablet Take 1 tablet (25 mg total) by mouth daily. 30 tablet 6  . TRUEplus Lancets 28G MISC Use to measure blood sugar twice a day 100 each 1  . Vitamin D, Ergocalciferol, (DRISDOL) 1.25 MG (50000 UNIT) CAPS capsule Take 50,000 Units by mouth once a week.     No facility-administered medications prior to visit.    Allergies  Allergen Reactions  . Sulfa Antibiotics Rash    Severe rash. Do not give sulfa medications  . Bactrim [Sulfamethoxazole-Trimethoprim]     Rash  . Cephalexin     Patient with severe drug reaction including exfoliating skin rash and hypotension  after being prescribed both cephalexin and sulfamethoxazole-trimethoprim simultaneously. Favor SMX as much more likely culprit as patient had tolerated other cephalosporins in the past, but cannot say with complete certainty that this was not related to cephalexin.   . Other     Patient states he is allergic to the TB test    ROS Review of Systems  Constitutional: Negative.   HENT: Negative.   Eyes: Negative.   Respiratory: Negative.   Cardiovascular: Negative.   Gastrointestinal: Negative.   Endocrine: Negative.   Genitourinary: Negative.   Musculoskeletal: Positive for arthralgias (generalized joint pain).  Skin: Negative.   Allergic/Immunologic: Negative.   Neurological: Positive for dizziness (occasional ) and headaches (occasional ).  Psychiatric/Behavioral: Negative.    Objective:    Physical Exam Vitals and nursing note reviewed.  Constitutional:      Appearance: Normal appearance.  HENT:     Head: Normocephalic and atraumatic.     Nose: Nose normal.     Mouth/Throat:     Mouth: Mucous membranes are moist.     Pharynx: Oropharynx is clear.  Cardiovascular:     Rate and Rhythm: Normal rate and regular rhythm.     Pulses: Normal pulses.     Heart sounds: Normal heart sounds.  Pulmonary:     Effort: Pulmonary effort is normal.     Breath sounds: Normal breath sounds.   Abdominal:     General: Bowel sounds are normal.     Palpations: Abdomen is soft.  Musculoskeletal:     Cervical back: Normal range of motion and neck supple.  Neurological:     Mental Status: He is alert.    BP (!) 170/89 (BP Location: Left Arm, Patient Position: Sitting, Cuff Size: Normal)   Pulse 98   Temp 98.1 F (36.7 C)   Ht 5' 7"  (1.702 m)   Wt 157 lb (71.2 kg)   SpO2 99%   BMI 24.59 kg/m  Wt Readings from Last 3 Encounters:  03/08/20 157 lb (71.2 kg)  03/07/20 160 lb (72.6 kg)  03/04/20 165 lb 5.5 oz (75 kg)     Health Maintenance Due  Topic Date Due  . OPHTHALMOLOGY EXAM  Never done    There are no preventive care reminders to display for this patient.  Lab Results  Component Value Date   TSH 2.470 01/22/2019   Lab Results  Component Value Date   WBC 7.4 02/22/2020   HGB 11.7 (L) 02/22/2020   HCT 36.3 (L) 02/22/2020   MCV 84 02/22/2020   PLT 421 02/22/2020   Lab Results  Component Value Date   NA 134 02/22/2020   K 4.3 02/22/2020   CO2 24 02/22/2020   GLUCOSE 398 (H) 02/22/2020   BUN 30 (H) 02/22/2020   CREATININE 1.94 (H) 02/22/2020   BILITOT <0.2 02/22/2020   ALKPHOS 183 (H) 02/22/2020   AST 20 02/22/2020   ALT 13 02/22/2020   PROT 5.7 (L) 02/22/2020   ALBUMIN 2.7 (L) 02/22/2020   CALCIUM 8.0 (L) 02/22/2020   ANIONGAP 11 06/05/2019   Lab Results  Component Value Date   CHOL 176 07/27/2019   Lab Results  Component Value Date   HDL 44 07/27/2019   Lab Results  Component Value Date   LDLCALC 114 (H) 07/27/2019   Lab Results  Component Value Date   TRIG 100 07/27/2019   Lab Results  Component Value Date   CHOLHDL 4.0 07/27/2019   Lab Results  Component Value Date   HGBA1C  12.5 (A) 01/17/2020   HGBA1C 12.5 01/17/2020   HGBA1C 12.5 (A) 01/17/2020   HGBA1C 12.4 (A) 01/17/2020      Assessment & Plan:   1. Noncompliance with medical treatment  2. Edema of both lower extremities Stable today.   3. Uncontrolled type 2  diabetes mellitus with peripheral neuropathy ([place]) He will continue medication as prescribed, to decrease foods/beverages high in sugars and carbs and follow Heart Healthy or DASH diet. Increase physical activity to at least 30 minutes cardio exercise daily.   4. Hemoglobin A1C greater than 9%, indicating poor diabetic control Most recent Hgb A1c remains increased at 12.5. Monitor.   5. Elevated blood pressure reading with diagnosis of hypertension  6. Essential hypertension  7. Anxiety  8. Follow up He will follow up in 3 months.   No orders of the defined types were placed in this encounter.   No orders of the defined types were placed in this encounter.   Referral Orders  No referral(s) requested today    Kathe Becton,  MSN, FNP-BC [facility] [address] [place], [place] [address] [phone]- fax   Problem List Items Addressed This Visit      Cardiovascular and Mediastinum   Essential hypertension     Endocrine   Hemoglobin A1C greater than 9%, indicating poor diabetic control   Uncontrolled type 2 diabetes mellitus with peripheral neuropathy ([place])    Other Visit Diagnoses    Edema of both lower extremities    -  Primary   R >L    Elevated blood pressure reading with diagnosis of hypertension       Anxiety       Follow up          No orders of the defined types were placed in this encounter.   Follow-up: Return in about 1 month (around 04/08/2020).    [name], FNP

## 2020-03-14 ENCOUNTER — Encounter: Payer: Self-pay | Admitting: Family Medicine

## 2020-03-21 MED FILL — ?FUROSEMIDE 4OMG TABLETS: 40 | 30 days supply | Qty: 30 | Fill #0

## 2020-03-21 MED FILL — ?CARVEDILOL 25 MG TABLET: 25 | 30 days supply | Qty: 60 | Fill #0

## 2020-03-21 MED FILL — hydrALAZINE HCL 25 MG TABS: 25 | 30 days supply | Qty: 90 | Fill #0

## 2020-03-30 ENCOUNTER — Other Ambulatory Visit (HOSPITAL_COMMUNITY): Payer: Self-pay

## 2020-04-07 ENCOUNTER — Ambulatory Visit: Payer: Self-pay | Admitting: Family Medicine

## 2020-04-10 ENCOUNTER — Ambulatory Visit: Payer: Self-pay

## 2020-04-10 NOTE — Progress Notes (Deleted)
Patient ID: [name]                 DOB: 01/16/1971                      MRN: [id number]     HPI: [name] is a 49 y.o. male referred by Dr. Gwenlyn Found to HTN clinic.   Lisinopril stopped??  Current HTN meds:  Amlodipine 66m daily Carvedilol 23mtwice daily Furosemide 4015maily  Hydralazine 93m38mD  Previously tried:   BP goal: <130/80 (140/90)  Family History:   Social History:   Diet:   Exercise:   Home BP readings:   Wt Readings from Last 3 Encounters:  03/08/20 157 lb (71.2 kg)  03/07/20 160 lb (72.6 kg)  03/04/20 165 lb 5.5 oz (75 kg)   BP Readings from Last 3 Encounters:  03/08/20 (!) 170/89  03/07/20 (!) 176/90  03/04/20 (!) 170/89   Pulse Readings from Last 3 Encounters:  03/08/20 98  03/07/20 94  03/04/20 89    Renal function: CrCl cannot be calculated (Patient's most recent lab result is older than the maximum 21 days allowed.).  Past Medical History:  Diagnosis Date  . Anxiety 02/2019  . Anxiety 10/2019  . Diabetes mellitus type II, uncontrolled (HCC)[place]28/2008  . Elevated alkaline phosphatase level 01/2019  . ESSENTIAL HYPERTENSION 07/17/2006  . HYPERLIPIDEMIA 11/24/2008  . Insomnia 02/2019  . Left bundle branch block 02/22/2019  . Lesion of lip 10/2019  . Pneumonia 10/22/2011  . Suicidal ideations 02/2019  . Vitamin D deficiency 01/2019    Current Outpatient Medications on File Prior to Visit  Medication Sig Dispense Refill  . amLODipine (NORVASC) 10 MG tablet Take 1 tablet (10 mg total) by mouth daily. 90 tablet 3  . atorvastatin (LIPITOR) 40 MG tablet Take 1 tablet (40 mg total) by mouth daily. 90 tablet 1  . Blood Glucose Monitoring Suppl (TRUE METRIX METER) w/Device KIT Use to measure blood sugar twice a day 1 kit 0  . carvedilol (COREG) 25 MG tablet Take 1 tablet (25 mg total) by mouth 2 (two) times daily. 180 tablet 3  . furosemide (LASIX) 40 MG tablet Take 1 tablet (40 mg total) by mouth daily. 90 tablet 3  . glucose  blood test strip Use to check fasting blood sugar once daily. 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 Insulin dependent 100 each 12  . hydrALAZINE (APRESOLINE) 25 MG tablet Take 1 tablet (25 mg total) by mouth 3 (three) times daily. 270 tablet 3  . insulin aspart (NOVOLOG FLEXPEN) 100 UNIT/ML FlexPen Inject 10 Units into the skin 2 (two) times daily with a meal. 15 mL 2  . insulin detemir (LEVEMIR FLEXTOUCH) 100 UNIT/ML FlexPen Inject 35 Units into the skin daily. 15 mL 11  . Insulin Pen Needle (PEN NEEDLES 29GX1/2") 29G X 12MM MISC Use with insulin pens 100 each 1  . sildenafil (VIAGRA) 25 MG tablet Take 1 tablet (25 mg total) by mouth as needed for erectile dysfunction. for erectile dysfunction. 30 tablet 3  . sitaGLIPtin (JANUVIA) 25 MG tablet Take 1 tablet (25 mg total) by mouth daily. 30 tablet 6  . TRUEplus Lancets 28G MISC Use to measure blood sugar twice a day 100 each 1  . Vitamin D, Ergocalciferol, (DRISDOL) 1.25 MG (50000 UNIT) CAPS capsule Take 50,000 Units by mouth once a week.    . [DISCONTINUED] gabapentin (NEURONTIN) 300 MG capsule Take 1 capsule (300 mg total) by mouth  3 (three) times daily. (Patient not taking: Reported on 07/27/2019) 90 capsule 3   No current facility-administered medications on file prior to visit.    Allergies  Allergen Reactions  . Sulfa Antibiotics Rash    Severe rash. Do not give sulfa medications  . Bactrim [Sulfamethoxazole-Trimethoprim]     Rash  . Cephalexin     Patient with severe drug reaction including exfoliating skin rash and hypotension after being prescribed both cephalexin and sulfamethoxazole-trimethoprim simultaneously. Favor SMX as much more likely culprit as patient had tolerated other cephalosporins in the past, but cannot say with complete certainty that this was not related to cephalexin.   . Other     Patient states he is allergic to the TB test    There were no vitals taken for this visit.  No problem-specific Assessment & Plan notes found for this  encounter.   Jameah Rouser Rodriguez-Guzman PharmD, BCPS, [place] [id number] 04/10/2020 9:44 AM

## 2020-04-17 ENCOUNTER — Encounter: Payer: Self-pay | Attending: Critical Care Medicine | Admitting: Registered"

## 2020-04-26 ENCOUNTER — Ambulatory Visit: Payer: Self-pay | Admitting: Family Medicine

## 2020-05-01 ENCOUNTER — Encounter (HOSPITAL_COMMUNITY): Payer: Self-pay | Admitting: Cardiovascular Disease

## 2020-05-01 ENCOUNTER — Other Ambulatory Visit (HOSPITAL_COMMUNITY): Payer: Self-pay

## 2020-05-08 ENCOUNTER — Ambulatory Visit: Payer: Self-pay

## 2020-05-09 ENCOUNTER — Ambulatory Visit (INDEPENDENT_AMBULATORY_CARE_PROVIDER_SITE_OTHER): Payer: Self-pay | Admitting: Family Medicine

## 2020-05-09 ENCOUNTER — Other Ambulatory Visit: Payer: Self-pay

## 2020-05-09 ENCOUNTER — Encounter: Payer: Self-pay | Admitting: Family Medicine

## 2020-05-09 VITALS — BP 175/94 | HR 90 | Temp 97.2°F | Ht 67.0 in | Wt 151.0 lb

## 2020-05-09 DIAGNOSIS — Z09 Encounter for follow-up examination after completed treatment for conditions other than malignant neoplasm: Secondary | ICD-10-CM

## 2020-05-09 DIAGNOSIS — R7309 Other abnormal glucose: Secondary | ICD-10-CM

## 2020-05-09 DIAGNOSIS — I160 Hypertensive urgency: Secondary | ICD-10-CM

## 2020-05-09 DIAGNOSIS — IMO0002 Reserved for concepts with insufficient information to code with codable children: Secondary | ICD-10-CM

## 2020-05-09 DIAGNOSIS — E1165 Type 2 diabetes mellitus with hyperglycemia: Secondary | ICD-10-CM

## 2020-05-09 DIAGNOSIS — Z91199 Patient's noncompliance with other medical treatment and regimen due to unspecified reason: Secondary | ICD-10-CM

## 2020-05-09 DIAGNOSIS — Z9119 Patient's noncompliance with other medical treatment and regimen: Secondary | ICD-10-CM

## 2020-05-09 DIAGNOSIS — R739 Hyperglycemia, unspecified: Secondary | ICD-10-CM

## 2020-05-09 DIAGNOSIS — I10 Essential (primary) hypertension: Secondary | ICD-10-CM

## 2020-05-09 DIAGNOSIS — E1142 Type 2 diabetes mellitus with diabetic polyneuropathy: Secondary | ICD-10-CM

## 2020-05-09 NOTE — Progress Notes (Signed)
Patient [facility] ' Established Patient Office Visit  Subjective:  Patient ID: [name], male    DOB: Oct 16, 1970  Age: 49 y.o. MRN: [id number]  CC:  Chief Complaint  Patient presents with  . Follow-up    HPI [name] is a 49 year old male who presents for Follow Up today.    Patient Active Problem List   Diagnosis Date Noted  . Lower extremity edema 03/07/2020  . CKD (chronic kidney disease) stage 3, GFR 30-59 ml/min (HCC) 02/22/2020  . Left ventricular dysfunction 04/27/2019  . Hemoglobin A1C greater than 9%, indicating poor diabetic control 07/29/2018  . New onset left bundle branch block (LBBB)   . 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 Hyperlipidemia LDL goal <70 11/24/2008  . Uncontrolled type 2 diabetes mellitus with peripheral neuropathy ([place]) 07/17/2006  . Essential hypertension 07/17/2006    Current Status: Since his last office visit, he is doing well with no complaints. His blood pressure readings are elevated today. He is currently not taking his prescribed medications as prescribed. He states that his medication was discarded by another friend. He has expressed this same event with non-compliance of medications several times in the past. He denies visual changes, chest pain, cough, shortness of breath, heart palpitations, and falls. He has occasional headaches and dizziness with position changes. Denies severe headaches, confusion, seizures, double vision, and blurred vision, nausea and vomiting. His anxiety is moderate today, r/t personal and family issues. He denies suicidal ideations, homicidal ideations, or auditory hallucinations. He denies fevers, chills, fatigue, recent infections, weight loss, and night sweats. Denies GI problems such as diarrhea, and constipation. He has no reports of blood in stools, dysuria and hematuria. He is taking all medications as prescribed. He denies pain today.    Past Medical History:  Diagnosis Date  . Anxiety 02/2019  . Anxiety 10/2019  . Diabetes mellitus type II, uncontrolled ([place]) 07/17/2006  . Elevated alkaline phosphatase level 01/2019  . ESSENTIAL HYPERTENSION 07/17/2006  . HYPERLIPIDEMIA 11/24/2008  . Insomnia 02/2019  . Left bundle branch block 02/22/2019  . Lesion of lip 10/2019  . Pneumonia 10/22/2011  . Suicidal ideations 02/2019  . Vitamin D deficiency 01/2019    Past Surgical History:  Procedure Laterality Date  . ARM SURGERY      Family History  Problem Relation Age of Onset  . Hypertension Mother 84  . Cancer Mother        bladder cancer  . Alcohol abuse Father   . Hypertension Brother     Social History   Socioeconomic History  . Marital status: Legally Separated    Spouse name: Not on file  . Number of children: Not on file  . Years of education: Not on file  . Highest education level: Not on file  Occupational History  . 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 Occupation: Biochemist, clinical    Comment: Updated June 2013  . Occupation: Engineer, civil (consulting)    Comment: Began Jan 2014  Tobacco Use  . Smoking status: Never Smoker  . Smokeless tobacco: Never Used  Vaping Use  . Vaping Use: Never used  Substance and Sexual Activity  . Alcohol use: No    Alcohol/week: 0.0 standard drinks  . Drug use: No  . Sexual activity: Yes    Partners: Female  Other Topics Concern  . Not on file  Social History Narrative   Lives with  his mother and brother.   Wife has had a foot amputation; is a type 1 DM.   Social Determinants of Health   Financial Resource Strain: Not on file  Food Insecurity: Not on file  Transportation Needs: Not on file  Physical Activity: Not on file  Stress: Not on file  Social Connections: Not on file  Intimate Partner Violence: Not on file    Outpatient Medications Prior to Visit  Medication Sig Dispense Refill  . amLODipine (NORVASC) 10 MG tablet Take 1 tablet (10 mg total) by mouth  daily. 90 tablet 3  . atorvastatin (LIPITOR) 40 MG tablet Take 1 tablet (40 mg total) by mouth daily. 90 tablet 1  . Blood Glucose Monitoring Suppl (TRUE METRIX METER) w/Device KIT Use to measure blood sugar twice a day 1 kit 0  . carvedilol (COREG) 25 MG tablet Take 1 tablet (25 mg total) by mouth 2 (two) times daily. 180 tablet 3  . furosemide (LASIX) 40 MG tablet Take 1 tablet (40 mg total) by mouth daily. 90 tablet 3  . glucose blood test strip Use to check fasting blood sugar once daily. 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 Insulin dependent 100 each 12  . hydrALAZINE (APRESOLINE) 25 MG tablet Take 1 tablet (25 mg total) by mouth 3 (three) times daily. 270 tablet 3  . insulin aspart (NOVOLOG FLEXPEN) 100 UNIT/ML FlexPen Inject 10 Units into the skin 2 (two) times daily with a meal. 15 mL 2  . insulin detemir (LEVEMIR FLEXTOUCH) 100 UNIT/ML FlexPen Inject 35 Units into the skin daily. 15 mL 11  . Insulin Pen Needle (PEN NEEDLES 29GX1/2") 29G X 12MM MISC Use with insulin pens 100 each 1  . sildenafil (VIAGRA) 25 MG tablet Take 1 tablet (25 mg total) by mouth as needed for erectile dysfunction. for erectile dysfunction. 30 tablet 3  . sitaGLIPtin (JANUVIA) 25 MG tablet Take 1 tablet (25 mg total) by mouth daily. 30 tablet 6  . TRUEplus Lancets 28G MISC Use to measure blood sugar twice a day 100 each 1  . Vitamin D, Ergocalciferol, (DRISDOL) 1.25 MG (50000 UNIT) CAPS capsule Take 50,000 Units by mouth once a week.     No facility-administered medications prior to visit.    Allergies  Allergen Reactions  . Sulfa Antibiotics Rash    Severe rash. Do not give sulfa medications  . Bactrim [Sulfamethoxazole-Trimethoprim]     Rash  . Cephalexin     Patient with severe drug reaction including exfoliating skin rash and hypotension after being prescribed both cephalexin and sulfamethoxazole-trimethoprim simultaneously. Favor SMX as much more likely culprit as patient had tolerated other cephalosporins in the past, but  cannot say with complete certainty that this was not related to cephalexin.   . Other     Patient states he is allergic to the TB test    ROS Review of Systems  Constitutional: Negative.   HENT: Negative.   Eyes: Negative.   Respiratory: Negative.   Cardiovascular: Negative.   Gastrointestinal: Negative.   Endocrine: Negative.   Genitourinary: Negative.   Musculoskeletal: Positive for arthralgias (generalized joint pain).  Skin: Negative.   Allergic/Immunologic: Negative.   Neurological: Positive for dizziness (occasional ) and headaches (occasional ).  Hematological: Negative.   Psychiatric/Behavioral: Positive for agitation. The patient is nervous/anxious and is hyperactive.       Objective:    Physical Exam Vitals and nursing note reviewed.  Constitutional:      Appearance: Normal appearance.  HENT:  Head: Normocephalic and atraumatic.     Nose: Nose normal.  Cardiovascular:     Rate and Rhythm: Normal rate and regular rhythm.     Pulses: Normal pulses.     Heart sounds: Normal heart sounds.  Pulmonary:     Effort: Pulmonary effort is normal.     Breath sounds: Normal breath sounds.  Abdominal:     General: Bowel sounds are normal.     Palpations: Abdomen is soft.  Musculoskeletal:        General: Normal range of motion.     Cervical back: Normal range of motion and neck supple.  Skin:    General: Skin is warm and dry.  Neurological:     General: No focal deficit present.     Mental Status: He is alert and oriented to person, place, and time.  Psychiatric:        Mood and Affect: Mood normal.        Behavior: Behavior normal.        Thought Content: Thought content normal.        Judgment: Judgment normal.     BP (!) 175/94   Pulse 90   Temp (!) 97.2 F (36.2 C) (Temporal)   Ht 5' 7"  (1.702 m)   Wt 151 lb (68.5 kg)   SpO2 99%   BMI 23.65 kg/m  Wt Readings from Last 3 Encounters:  05/09/20 151 lb (68.5 kg)  03/08/20 157 lb (71.2 kg)   03/07/20 160 lb (72.6 kg)     Health Maintenance Due  Topic Date Due  . OPHTHALMOLOGY EXAM  Never done  . HEMOGLOBIN A1C  04/18/2020    There are no preventive care reminders to display for this patient.  Lab Results  Component Value Date   TSH 2.470 01/22/2019   Lab Results  Component Value Date   WBC 7.4 02/22/2020   HGB 11.7 (L) 02/22/2020   HCT 36.3 (L) 02/22/2020   MCV 84 02/22/2020   PLT 421 02/22/2020   Lab Results  Component Value Date   NA 134 02/22/2020   K 4.3 02/22/2020   CO2 24 02/22/2020   GLUCOSE 398 (H) 02/22/2020   BUN 30 (H) 02/22/2020   CREATININE 1.94 (H) 02/22/2020   BILITOT <0.2 02/22/2020   ALKPHOS 183 (H) 02/22/2020   AST 20 02/22/2020   ALT 13 02/22/2020   PROT 5.7 (L) 02/22/2020   ALBUMIN 2.7 (L) 02/22/2020   CALCIUM 8.0 (L) 02/22/2020   ANIONGAP 11 06/05/2019   Lab Results  Component Value Date   CHOL 176 07/27/2019   Lab Results  Component Value Date   HDL 44 07/27/2019   Lab Results  Component Value Date   LDLCALC 114 (H) 07/27/2019   Lab Results  Component Value Date   TRIG 100 07/27/2019   Lab Results  Component Value Date   CHOLHDL 4.0 07/27/2019   Lab Results  Component Value Date   HGBA1C 12.5 (A) 01/17/2020   HGBA1C 12.5 01/17/2020   HGBA1C 12.5 (A) 01/17/2020   HGBA1C 12.4 (A) 01/17/2020      Assessment & Plan:   1. Hypertensive urgency Blood pressures are elevated today. Clonidine 0.3 mg given to patient in office and blood pressures remain elevated. We referred him to ED via ambulance at this time. Patient refused and signed AMA form at discharge. He denies severe headaches, confusion, seizures, double vision, and blurred vision, nausea and vomiting. He will report to ED if he experiences these symptoms.  Patient verbalized understanding.   - Urinalysis - CBC with Differential - Comprehensive metabolic panel; Future - Hemoglobin A1c - Comprehensive metabolic panel  2. Essential hypertension He will  continue the current medical regimen; he will continue to take medications as prescribed, to decrease high sodium intake, excessive alcohol intake, increase potassium intake, smoking cessation, and increase physical activity of at least 30 minutes of cardio activity daily. He will continue to follow Heart Healthy or DASH diet.  3. Uncontrolled type 2 diabetes mellitus with peripheral neuropathy ([place])  He will continue medication as prescribed, to decrease foods/beverages high in sugars and carbs and follow Heart Healthy or DASH diet. Increase physical activity to at least 30 minutes cardio exercise daily. - Urinalysis - CBC with Differential - Comprehensive metabolic panel; Future - Hemoglobin A1c - Comprehensive metabolic panel  4. Hemoglobin A1C greater than 9%, indicating poor diabetic control Hgb A1c is elevated today at 13.5 today. Monitor.  5. Hyperglycemia  6. Non compliance with medical treatment  7. Follow up He will in 1 month.   No orders of the defined types were placed in this encounter.   Orders Placed This Encounter  Procedures  . Urinalysis  . CBC with Differential  . Comprehensive metabolic panel  . Hemoglobin A1c    Referral Orders  No referral(s) requested today    Kathe Becton, MSN, ANE, FNP-BC [facility] [address] [place], [place] [address] [phone]- fax  Problem List Items Addressed This Visit      Cardiovascular and Mediastinum   Essential hypertension     Endocrine   Hemoglobin A1C greater than 9%, indicating poor diabetic control   Uncontrolled type 2 diabetes mellitus with peripheral neuropathy (HCC)   Relevant Orders   Urinalysis   CBC with Differential   Comprehensive metabolic panel   Hemoglobin A1c    Other Visit Diagnoses    Hypertensive urgency    -  Primary   Relevant Orders   Urinalysis   CBC with Differential    Comprehensive metabolic panel   Hemoglobin A1c   Hyperglycemia       Non compliance with medical treatment       Follow up          No orders of the defined types were placed in this encounter.   Follow-up: No follow-ups on file.    [name], FNP

## 2020-05-10 LAB — COMPREHENSIVE METABOLIC PANEL
ALT: 9 IU/L (ref 0–44)
AST: 19 IU/L (ref 0–40)
Albumin/Globulin Ratio: 1 — ABNORMAL LOW (ref 1.2–2.2)
Albumin: 2.9 g/dL — ABNORMAL LOW (ref 4.0–5.0)
Alkaline Phosphatase: 129 IU/L — ABNORMAL HIGH (ref 44–121)
BUN/Creatinine Ratio: 11 (ref 9–20)
BUN: 26 mg/dL — ABNORMAL HIGH (ref 6–24)
Bilirubin Total: <0.2 mg/dL (ref 0.0–1.2)
CO2: 26 mmol/L (ref 20–29)
Calcium: 8.2 mg/dL — ABNORMAL LOW (ref 8.7–10.2)
Chloride: 101 mmol/L (ref 96–106)
Creatinine, Ser: 2.33 mg/dL — ABNORMAL HIGH (ref 0.76–1.27)
GFR calc Af Amer: 37 mL/min/{1.73_m2} — ABNORMAL LOW (ref >59)
GFR calc non Af Amer: 32 mL/min/{1.73_m2} — ABNORMAL LOW (ref >59)
Globulin, Total: 2.9 g/dL (ref 1.5–4.5)
Glucose: 270 mg/dL — ABNORMAL HIGH (ref 65–99)
Potassium: 4.2 mmol/L (ref 3.5–5.2)
Sodium: 138 mmol/L (ref 134–144)
Total Protein: 5.8 g/dL — ABNORMAL LOW (ref 6.0–8.5)

## 2020-05-10 LAB — URINALYSIS
Bilirubin, UA: NEGATIVE
Ketones, UA: NEGATIVE
Leukocytes,UA: NEGATIVE
Nitrite, UA: NEGATIVE
Specific Gravity, UA: >=1.03 — AB (ref 1.005–1.030)
Urobilinogen, Ur: 0.2 mg/dL (ref 0.2–1.0)
pH, UA: 6.5 (ref 5.0–7.5)

## 2020-05-10 LAB — CBC WITH DIFFERENTIAL/PLATELET
Basophils Absolute: 0 10*3/uL (ref 0.0–0.2)
Basos: 1 %
EOS (ABSOLUTE): 0.1 10*3/uL (ref 0.0–0.4)
Eos: 2 %
Hematocrit: 41.5 % (ref 37.5–51.0)
Hemoglobin: 13.2 g/dL (ref 13.0–17.7)
Immature Grans (Abs): 0 10*3/uL (ref 0.0–0.1)
Immature Granulocytes: 0 %
Lymphocytes Absolute: 1.6 10*3/uL (ref 0.7–3.1)
Lymphs: 31 %
MCH: 26.7 pg (ref 26.6–33.0)
MCHC: 31.8 g/dL (ref 31.5–35.7)
MCV: 84 fL (ref 79–97)
Monocytes Absolute: 0.4 10*3/uL (ref 0.1–0.9)
Monocytes: 8 %
Neutrophils Absolute: 3 10*3/uL (ref 1.4–7.0)
Neutrophils: 58 %
Platelets: 418 10*3/uL (ref 150–450)
RBC: 4.94 x10E6/uL (ref 4.14–5.80)
RDW: 13.1 % (ref 11.6–15.4)
WBC: 5.3 10*3/uL (ref 3.4–10.8)

## 2020-05-10 LAB — HEMOGLOBIN A1C
Est. average glucose Bld gHb Est-mCnc: 338 mg/dL
Hgb A1c MFr Bld: 13.4 % — ABNORMAL HIGH (ref 4.8–5.6)

## 2020-05-11 ENCOUNTER — Telehealth (HOSPITAL_COMMUNITY): Payer: Self-pay | Admitting: Cardiovascular Disease

## 2020-05-11 NOTE — Telephone Encounter (Signed)
Just an FYI. We have made several attempts to contact this patient including sending a letter to schedule or reschedule their echocardiogram. We will be removing the patient from the echo WQ.Marland Kitchen  05/01/20 NO SHOWED ECHO- MAILED LETTER [facility]    Thank you

## 2020-05-16 ENCOUNTER — Encounter: Payer: Self-pay | Admitting: Family Medicine

## 2020-05-24 ENCOUNTER — Ambulatory Visit: Payer: Self-pay | Admitting: Family Medicine

## 2020-06-02 ENCOUNTER — Other Ambulatory Visit: Payer: Self-pay

## 2020-06-02 ENCOUNTER — Ambulatory Visit (HOSPITAL_COMMUNITY): Payer: Self-pay | Attending: Cardiovascular Disease

## 2020-06-02 DIAGNOSIS — I519 Heart disease, unspecified: Secondary | ICD-10-CM

## 2020-06-02 LAB — ECHOCARDIOGRAM COMPLETE
Area-P 1/2: 3.37 cm2
S' Lateral: 4.3 cm

## 2020-06-07 ENCOUNTER — Ambulatory Visit: Payer: Self-pay | Admitting: Cardiology

## 2020-06-13 ENCOUNTER — Ambulatory Visit: Payer: Self-pay | Admitting: Family Medicine

## 2020-06-23 ENCOUNTER — Encounter (HOSPITAL_COMMUNITY): Payer: Self-pay

## 2020-06-23 ENCOUNTER — Other Ambulatory Visit: Payer: Self-pay

## 2020-06-23 ENCOUNTER — Ambulatory Visit (HOSPITAL_COMMUNITY)
Admission: EM | Admit: 2020-06-23 | Discharge: 2020-06-23 | Disposition: A | Discharge status: Home / Self Care | Payer: HRSA Program | Attending: Medical Oncology | Admitting: Medical Oncology

## 2020-06-23 DIAGNOSIS — T461X6A Underdosing of calcium-channel blockers, initial encounter: Secondary | ICD-10-CM | POA: Insufficient documentation

## 2020-06-23 DIAGNOSIS — Z7984 Long term (current) use of oral hypoglycemic drugs: Secondary | ICD-10-CM | POA: Insufficient documentation

## 2020-06-23 DIAGNOSIS — Z882 Allergy status to sulfonamides status: Secondary | ICD-10-CM | POA: Diagnosis not present

## 2020-06-23 DIAGNOSIS — Z881 Allergy status to other antibiotic agents status: Secondary | ICD-10-CM | POA: Insufficient documentation

## 2020-06-23 DIAGNOSIS — Z794 Long term (current) use of insulin: Secondary | ICD-10-CM | POA: Insufficient documentation

## 2020-06-23 DIAGNOSIS — R059 Cough, unspecified: Secondary | ICD-10-CM | POA: Diagnosis not present

## 2020-06-23 DIAGNOSIS — E1122 Type 2 diabetes mellitus with diabetic chronic kidney disease: Secondary | ICD-10-CM | POA: Diagnosis not present

## 2020-06-23 DIAGNOSIS — Z79899 Other long term (current) drug therapy: Secondary | ICD-10-CM | POA: Insufficient documentation

## 2020-06-23 DIAGNOSIS — Z20822 Contact with and (suspected) exposure to covid-19: Secondary | ICD-10-CM | POA: Insufficient documentation

## 2020-06-23 DIAGNOSIS — T501X6A Underdosing of loop [high-ceiling] diuretics, initial encounter: Secondary | ICD-10-CM | POA: Insufficient documentation

## 2020-06-23 DIAGNOSIS — Z8249 Family history of ischemic heart disease and other diseases of the circulatory system: Secondary | ICD-10-CM | POA: Diagnosis not present

## 2020-06-23 DIAGNOSIS — T447X6A Underdosing of beta-adrenoreceptor antagonists, initial encounter: Secondary | ICD-10-CM | POA: Insufficient documentation

## 2020-06-23 DIAGNOSIS — R609 Edema, unspecified: Secondary | ICD-10-CM | POA: Insufficient documentation

## 2020-06-23 DIAGNOSIS — Z8616 Personal history of COVID-19: Secondary | ICD-10-CM | POA: Diagnosis not present

## 2020-06-23 DIAGNOSIS — R0602 Shortness of breath: Secondary | ICD-10-CM | POA: Diagnosis not present

## 2020-06-23 DIAGNOSIS — Z91128 Patient's intentional underdosing of medication regimen for other reason: Secondary | ICD-10-CM | POA: Diagnosis not present

## 2020-06-23 DIAGNOSIS — N183 Chronic kidney disease, stage 3 unspecified: Secondary | ICD-10-CM | POA: Insufficient documentation

## 2020-06-23 DIAGNOSIS — I129 Hypertensive chronic kidney disease with stage 1 through stage 4 chronic kidney disease, or unspecified chronic kidney disease: Secondary | ICD-10-CM | POA: Insufficient documentation

## 2020-06-23 NOTE — ED Provider Notes (Signed)
[facility]    CSN: [id number] Arrival date & time: 06/23/20  1813      History   Chief Complaint Chief Complaint  Patient presents with  . Leg Problem    HPI [name] is a 50 y.o. male.   HPI   Leg Pain: Patient reports that he has had bilateral leg swelling and feet swelling for about 1 week.  He also has had mild shortness of breath but he has had this off and on for the past year.  He reports that the shortness of breath occurs when having sexual intercourse.  He is supposed to be on blood pressure medication as shown below though he states that he stopped this on Wednesday as his blood pressure had been running in the normal range on medication and he did not think he needed it any longer.  Swelling occurs when he does not take his BP medications. He reports tolerating his BP medications well. He reports that he does see a cardiologist and has a follow-up next week.  He also asked for COVID-19 test.  He denies any exposures but states that he had COVID-19 last year and wants to make sure that he does not have COVID-19 currently.  Initially he stated that he was asymptomatic however when I discussed the reason for avoidance of screening in asymptomatic patients he states that he has had a cough.  Cough is mild and dry.  He is not taking anything for cough.  Past Medical History:  Diagnosis Date  . Anxiety 02/2019  . Anxiety 10/2019  . Diabetes mellitus type II, uncontrolled ([place]) 07/17/2006  . Elevated alkaline phosphatase level 01/2019  . ESSENTIAL HYPERTENSION 07/17/2006  . HYPERLIPIDEMIA 11/24/2008  . Insomnia 02/2019  . Left bundle branch block 02/22/2019  . Lesion of lip 10/2019  . Pneumonia 10/22/2011  . Suicidal ideations 02/2019  . Vitamin D deficiency 01/2019    Patient Active Problem List   Diagnosis Date Noted  . Lower extremity edema 03/07/2020  . CKD (chronic kidney disease) stage 3, GFR 30-59 ml/min (HCC) 02/22/2020  . Left ventricular  dysfunction 04/27/2019  . Hemoglobin A1C greater than 9%, indicating poor diabetic control 07/29/2018  . New onset left bundle branch block (LBBB)   . 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 Hyperlipidemia LDL goal <70 11/24/2008  . Uncontrolled type 2 diabetes mellitus with peripheral neuropathy ([place]) 07/17/2006  . Essential hypertension 07/17/2006    Past Surgical History:  Procedure Laterality Date  . ARM SURGERY         Home Medications    Prior to Admission medications   Medication Sig Start Date End Date Taking? Authorizing Provider  amLODipine (NORVASC) 10 MG tablet Take 1 tablet (10 mg total) by mouth daily. 02/22/20 02/11/22  [name], MD  atorvastatin (LIPITOR) 40 MG tablet Take 1 tablet (40 mg total) by mouth daily. 02/23/20   [name], MD  Blood Glucose Monitoring Suppl (TRUE METRIX METER) w/Device KIT Use to measure blood sugar twice a day 02/22/20   [name], MD  carvedilol (COREG) 25 MG tablet Take 1 tablet (25 mg total) by mouth 2 (two) times daily. 03/07/20   Lorretta Harp, MD  furosemide (LASIX) 40 MG tablet Take 1 tablet (40 mg total) by mouth daily. 03/07/20   Lorretta Harp, MD  glucose blood test strip Use to check fasting blood sugar once daily. 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 Insulin dependent 02/22/20   Joya Gaskins,  Burnett Harry, MD  hydrALAZINE (APRESOLINE) 25 MG tablet Take 1 tablet (25 mg total) by mouth 3 (three) times daily. 03/07/20   Lorretta Harp, MD  insulin aspart (NOVOLOG FLEXPEN) 100 UNIT/ML FlexPen Inject 10 Units into the skin 2 (two) times daily with a meal. 02/23/20   [name], MD  insulin detemir (LEVEMIR FLEXTOUCH) 100 UNIT/ML FlexPen Inject 35 Units into the skin daily. 02/22/20   [name], MD  Insulin Pen Needle (PEN NEEDLES 29GX1/2") 29G X 12MM MISC Use with insulin pens 02/22/20   [name], MD  sildenafil (VIAGRA) 25 MG tablet Take 1 tablet (25 mg total) by mouth as needed for erectile  dysfunction. for erectile dysfunction. 10/13/19   [name], FNP  sitaGLIPtin (JANUVIA) 25 MG tablet Take 1 tablet (25 mg total) by mouth daily. 02/22/20   [name], MD  TRUEplus Lancets 28G MISC Use to measure blood sugar twice a day 02/22/20   [name], MD  Vitamin D, Ergocalciferol, (DRISDOL) 1.25 MG (50000 UNIT) CAPS capsule Take 50,000 Units by mouth once a week. 01/26/20   [provider]  gabapentin (NEURONTIN) 300 MG capsule Take 1 capsule (300 mg total) by mouth 3 (three) times daily. Patient not taking: Reported on 07/27/2019 07/29/18 08/30/19  [name], FNP    Family History Family History  Problem Relation Age of Onset  . Hypertension Mother 68  . Cancer Mother        bladder cancer  . Alcohol abuse Father   . Hypertension Brother     Social History Social History   Tobacco Use  . Smoking status: Never Smoker  . Smokeless tobacco: Never Used  Vaping Use  . Vaping Use: Never used  Substance Use Topics  . Alcohol use: No    Alcohol/week: 0.0 standard drinks  . Drug use: No     Allergies   Sulfa antibiotics, Bactrim [sulfamethoxazole-trimethoprim], Cephalexin, and Other   Review of Systems Review of Systems  As stated above in HPI Physical Exam Triage Vital Signs ED Triage Vitals  Enc Vitals Group     BP 06/23/20 1854 (!) 191/101     Pulse Rate 06/23/20 1854 99     Resp 06/23/20 1854 19     Temp 06/23/20 1854 99.3 F (37.4 C)     Temp Source 06/23/20 1854 Oral     SpO2 06/23/20 1854 97 %     Weight --      Height --      Head Circumference --      Peak Flow --      Pain Score 06/23/20 1852 0     Pain Loc --      Pain Edu? --      Excl. in [place]? --    No data found.  Updated Vital Signs BP (!) 191/101 (BP Location: Right Arm) Comment: States he took his medications for hypertension 2 days ago for last time.  Pulse 99   Temp 99.3 F (37.4 C) (Oral)   Resp 19   SpO2 97%   Physical Exam Vitals and nursing  note reviewed.  Constitutional:      General: He is not in acute distress.    Appearance: Normal appearance. He is not ill-appearing, toxic-appearing or diaphoretic.  HENT:     Head: Normocephalic.  Eyes:     Comments: No evidence of pallor or jaundice  Neck:     Vascular: No carotid bruit.  Cardiovascular:     Rate and Rhythm: Normal rate and regular rhythm.     Pulses: Normal pulses.     Heart sounds: Normal heart sounds. No murmur heard. No friction rub. No gallop.   Pulmonary:     Effort: Pulmonary effort is normal. No respiratory distress.     Breath sounds: Normal breath sounds. No stridor. No wheezing, rhonchi or rales.  Chest:     Chest wall: No tenderness.  Abdominal:     General: There is no distension.     Palpations: Abdomen is soft.  Musculoskeletal:     Cervical back: Normal range of motion.     Right lower leg: 2+ Edema present.     Left lower leg: 2+ Pitting Edema present.  Skin:    Coloration: Skin is not jaundiced or pale.  Neurological:     Mental Status: He is alert.      UC Treatments / Results  Labs (all labs ordered are listed, but only abnormal results are displayed) Labs Reviewed - No data to display  EKG   Radiology No results found.  Procedures Procedures (including critical care time)  Medications Ordered in UC Medications - No data to display  Initial Impression / Assessment and Plan / UC Course  I have reviewed the triage vital signs and the nursing notes.  Pertinent labs & imaging results that were available during my care of the patient were reviewed by me and considered in my medical decision making (see chart for details).     New.  Patient appears stable and does not appear to have acute CHF but rather has not been taking his medications as directed.  I discussed with patient that I would like for him to take his Lasix and the carvedilol tonight and start his amlodipine in the morning to avoid correcting his blood pressure  too quickly.  He will then continue on this pattern until he sees his cardiologist.  His symptoms should improve and if not or should they worsen he will need to go to the emergency room.  See above regarding Covid testing.  As he does have a low-grade fever and cough I will check for COVID-19 today.  Discussed Covid precautions and symptom monitoring.   Final Clinical Impressions(s) / UC Diagnoses   Final diagnoses:  None   Discharge Instructions   None    ED Prescriptions    None     PDMP not reviewed this encounter.   Hughie Closs, [place] 06/23/20 1923

## 2020-06-23 NOTE — ED Notes (Addendum)
BP reported to [place], [place].

## 2020-06-23 NOTE — ED Triage Notes (Signed)
Pt presents with bilateral leg swelling and feet swelling x 1 week; SOB x 1 year. SOB worst when having sex. States he took water pills in the past and improved the swelling.    Reports seeing a Cardiologist for a 'heart blockage", has a follow up next week. Denies chest pain, dizziness  Pt requested COVID test.

## 2020-06-24 LAB — SARS CORONAVIRUS 2 (TAT 6-24 HRS): SARS Coronavirus 2: NEGATIVE

## 2020-06-26 ENCOUNTER — Emergency Department (HOSPITAL_COMMUNITY)
Admission: EM | Admit: 2020-06-26 | Discharge: 2020-06-26 | Disposition: A | Discharge status: Home / Self Care | Payer: Self-pay | Attending: Emergency Medicine | Admitting: Emergency Medicine

## 2020-06-26 ENCOUNTER — Encounter (HOSPITAL_COMMUNITY): Payer: Self-pay | Admitting: Emergency Medicine

## 2020-06-26 DIAGNOSIS — R600 Localized edema: Secondary | ICD-10-CM | POA: Insufficient documentation

## 2020-06-26 DIAGNOSIS — Z5321 Procedure and treatment not carried out due to patient leaving prior to being seen by health care provider: Secondary | ICD-10-CM | POA: Insufficient documentation

## 2020-06-26 LAB — CBC WITH DIFFERENTIAL/PLATELET
Abs Immature Granulocytes: 0.01 10*3/uL (ref 0.00–0.07)
Basophils Absolute: 0 10*3/uL (ref 0.0–0.1)
Basophils Relative: 1 %
Eosinophils Absolute: 0.2 10*3/uL (ref 0.0–0.5)
Eosinophils Relative: 3 %
HCT: 33.8 % — ABNORMAL LOW (ref 39.0–52.0)
Hemoglobin: 10.6 g/dL — ABNORMAL LOW (ref 13.0–17.0)
Immature Granulocytes: 0 %
Lymphocytes Relative: 25 %
Lymphs Abs: 1.4 10*3/uL (ref 0.7–4.0)
MCH: 26.6 pg (ref 26.0–34.0)
MCHC: 31.4 g/dL (ref 30.0–36.0)
MCV: 84.7 fL (ref 80.0–100.0)
Monocytes Absolute: 0.5 10*3/uL (ref 0.1–1.0)
Monocytes Relative: 9 %
Neutro Abs: 3.6 10*3/uL (ref 1.7–7.7)
Neutrophils Relative %: 62 %
Platelets: 330 10*3/uL (ref 150–400)
RBC: 3.99 MIL/uL — ABNORMAL LOW (ref 4.22–5.81)
RDW: 13.1 % (ref 11.5–15.5)
WBC: 5.8 10*3/uL (ref 4.0–10.5)
nRBC: 0 % (ref 0.0–0.2)

## 2020-06-26 LAB — URINALYSIS, ROUTINE W REFLEX MICROSCOPIC
Bilirubin Urine: NEGATIVE
Glucose, UA: >=500 mg/dL — AB
Hgb urine dipstick: NEGATIVE
Ketones, ur: NEGATIVE mg/dL
Leukocytes,Ua: NEGATIVE
Nitrite: NEGATIVE
Protein, ur: >=300 mg/dL — AB
Specific Gravity, Urine: 1.014 (ref 1.005–1.030)
pH: 6 (ref 5.0–8.0)

## 2020-06-26 LAB — COMPREHENSIVE METABOLIC PANEL
ALT: 14 U/L (ref 0–44)
AST: 23 U/L (ref 15–41)
Albumin: 1.8 g/dL — ABNORMAL LOW (ref 3.5–5.0)
Alkaline Phosphatase: 99 U/L (ref 38–126)
Anion gap: 8 (ref 5–15)
BUN: 37 mg/dL — ABNORMAL HIGH (ref 6–20)
CO2: 23 mmol/L (ref 22–32)
Calcium: 7.9 mg/dL — ABNORMAL LOW (ref 8.9–10.3)
Chloride: 105 mmol/L (ref 98–111)
Creatinine, Ser: 3.32 mg/dL — ABNORMAL HIGH (ref 0.61–1.24)
GFR, Estimated: 22 mL/min — ABNORMAL LOW (ref >60)
Glucose, Bld: 320 mg/dL — ABNORMAL HIGH (ref 70–99)
Potassium: 3.8 mmol/L (ref 3.5–5.1)
Sodium: 136 mmol/L (ref 135–145)
Total Bilirubin: 0.5 mg/dL (ref 0.3–1.2)
Total Protein: 5.4 g/dL — ABNORMAL LOW (ref 6.5–8.1)

## 2020-06-26 NOTE — ED Triage Notes (Signed)
Pt arrives via ems with c/o edema to bilateral legs that has progressed to his groin and testicles. Pt is non compliant with meds. A/ox4. EMS VS 150/98, cbg 403.

## 2020-06-26 NOTE — ED Notes (Signed)
Pt called for VS re-check no answer X3

## 2020-06-27 NOTE — Progress Notes (Addendum)
Cardiology Clinic Note   Patient Name: [name] Date of Encounter: 06/29/2020  Primary Care Provider:  [name], FNP Primary Cardiologist:  Quay Burow, MD  Patient Profile    [name]. [age] year old male presents the clinic today for follow-up evaluation of his essential hypertension  Past Medical History    Past Medical History:  Diagnosis Date  . Anxiety 02/2019  . Anxiety 10/2019  . Diabetes mellitus type II, uncontrolled ([place]) 07/17/2006  . Elevated alkaline phosphatase level 01/2019  . ESSENTIAL HYPERTENSION 07/17/2006  . HYPERLIPIDEMIA 11/24/2008  . Insomnia 02/2019  . Left bundle branch block 02/22/2019  . Lesion of lip 10/2019  . Pneumonia 10/22/2011  . Suicidal ideations 02/2019  . Vitamin D deficiency 01/2019   Past Surgical History:  Procedure Laterality Date  . ARM SURGERY      Allergies  Allergies  Allergen Reactions  . Sulfa Antibiotics Rash    Severe rash. Do not give sulfa medications  . Bactrim [Sulfamethoxazole-Trimethoprim]     Rash  . Cephalexin     Patient with severe drug reaction including exfoliating skin rash and hypotension after being prescribed both cephalexin and sulfamethoxazole-trimethoprim simultaneously. Favor SMX as much more likely culprit as patient had tolerated other cephalosporins in the past, but cannot say with complete certainty that this was not related to cephalexin.   . Other     Patient states he is allergic to the TB test    History of Present Illness    [name] has a PMH of essential hypertension, left bundle branch block, left ventricular dysfunction type 2 diabetes CKD stage III, hyperlipidemia, and lower extremity edema.  He was initially referred by his PCP for an evaluation of his dyspnea on exertion.  He reported no family history of heart disease.  He denied having previous heart attack or CVA.  He was noted to have a chronic left bundle branch block.  He reported dyspnea especially  while having sexual intercourse.  He was admitted to the hospital for hypertensive urgency 04/07/2019 after having a disagreement with his fiance.  His cardiac enzymes were low.  He had a coronary calcium score completed which showed 0 suggesting that his pain was not related to cardiac issues.  He admitted to dietary indiscretion with regards to salt and also reported medication noncompliance.  His echocardiogram 10/20 showed an EF of 40-45% which was reduced from normal EF on his previous echocardiogram 01/02/2016.  He contracted COVID-19 mid-January quarantined at home for 2 weeks.  He recovered well from his infection.  He denied chest pain or shortness of breath.  He reported taking his antihypertension medication as prescribed and avoiding salt.  He was last seen by Dr. Gwenlyn Found on 03/07/2020.  During that time he noted increased lower extremity swelling.  His blood pressure was poorly controlled (179/90).  He denied chest pain or shortness of breath.  His EKG showed sinus rhythm 94 bpm with left bundle branch block  He presented to the emergency department 06/26/2020 with complaints of progressive lower extremity edema extending to his groin and testicles.  He reported noncompliance with his medications.  His blood pressure was 150/98 with a blood glucose of 403.  He presents the clinic today for follow-up evaluation states he has not taken his furosemide since the summer.  He reports that after his swelling went away he stopped taking the medication.  He is also not taking his blood pressure medication in 2 days.  [facility]  He reports that he waited in the emergency department for quite some time and after not being seen he left without treatment.  He reports that his breathing is only labored with increased physical activity.  He reports that his swelling extends up to his hip.  We reviewed his lab work showing that his creatinine is elevated to  3.32.  We reviewed that his uncontrolled blood pressure and diabetes have impacted his kidney health.  He expressed understanding. He reports he has a nephrology follow up visit on Monday.  We reviewed the importance of low-sodium diet.  I will give him salty 6 sheet, order furosemide, refill his blood pressure medication, order potassium, have him weigh himself daily, give him a work note, and have him return to the clinic in 1 week.  We will order a BMP in 1 week.  Today he denies chest pain, increased shortness of breath, lower extremity edema, fatigue, palpitations, melena, hematuria, hemoptysis, diaphoresis, weakness, presyncope, syncope, orthopnea, and PND.   Home Medications    Prior to Admission medications   Medication Sig Start Date End Date Taking? Authorizing Provider  amLODipine (NORVASC) 10 MG tablet Take 1 tablet (10 mg total) by mouth daily. 02/22/20 02/11/22  [name], MD  atorvastatin (LIPITOR) 40 MG tablet Take 1 tablet (40 mg total) by mouth daily. 02/23/20   [name], MD  Blood Glucose Monitoring Suppl (TRUE METRIX METER) w/Device KIT Use to measure blood sugar twice a day 02/22/20   [name], MD  carvedilol (COREG) 25 MG tablet Take 1 tablet (25 mg total) by mouth 2 (two) times daily. 03/07/20   Lorretta Harp, MD  furosemide (LASIX) 40 MG tablet Take 1 tablet (40 mg total) by mouth daily. 03/07/20   Lorretta Harp, MD  glucose blood test strip Use to check fasting blood sugar once daily. 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 Insulin dependent 02/22/20   [name], MD  hydrALAZINE (APRESOLINE) 25 MG tablet Take 1 tablet (25 mg total) by mouth 3 (three) times daily. 03/07/20   Lorretta Harp, MD  insulin aspart (NOVOLOG FLEXPEN) 100 UNIT/ML FlexPen Inject 10 Units into the skin 2 (two) times daily with a meal. 02/23/20   [name], MD  insulin detemir (LEVEMIR FLEXTOUCH) 100 UNIT/ML FlexPen Inject 35 Units into the skin daily. 02/22/20   [name], MD  Insulin Pen Needle (PEN NEEDLES 29GX1/2") 29G X 12MM MISC Use with insulin pens 02/22/20   [name], MD  sildenafil (VIAGRA) 25 MG tablet Take 1 tablet (25 mg total) by mouth as needed for erectile dysfunction. for erectile dysfunction. 10/13/19   [name], FNP  sitaGLIPtin (JANUVIA) 25 MG tablet Take 1 tablet (25 mg total) by mouth daily. 02/22/20   [name], MD  TRUEplus Lancets 28G MISC Use to measure blood sugar twice a day 02/22/20   [name], MD  Vitamin D, Ergocalciferol, (DRISDOL) 1.25 MG (50000 UNIT) CAPS capsule Take 50,000 Units by mouth once a week. 01/26/20   [provider]  gabapentin (NEURONTIN) 300 MG capsule Take 1 capsule (300 mg total) by mouth 3 (three) times daily. Patient not taking: Reported on 07/27/2019 07/29/18 08/30/19  [name], FNP    Family History    Family History  Problem Relation Age of Onset  . Hypertension Mother 22  . Cancer Mother        bladder  cancer  . Alcohol abuse Father   . Hypertension Brother    He indicated that his mother is alive. He indicated that his father is deceased. He indicated that the status of his brother is unknown. He indicated that his daughter is alive. He indicated that his son is alive.  Social History    Social History   Socioeconomic History  . Marital status: Legally Separated    Spouse name: Not on file  . Number of children: Not on file  . Years of education: Not on file  . Highest education level: Not on file  Occupational History  . 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 Occupation: Biochemist, clinical    Comment: Updated June 2013  . Occupation: Engineer, civil (consulting)    Comment: Began Jan 2014  Tobacco Use  . Smoking status: Never Smoker  . Smokeless tobacco: Never Used  Vaping Use  . Vaping Use: Never used  Substance and Sexual Activity  . Alcohol use: No    Alcohol/week: 0.0 standard drinks  . Drug use: No  . Sexual activity: Yes    Partners: Female   Other Topics Concern  . Not on file  Social History Narrative   Lives with his mother and brother.   Wife has had a foot amputation; is a type 1 DM.   Social Determinants of Health   Financial Resource Strain: Not on file  Food Insecurity: Not on file  Transportation Needs: Not on file  Physical Activity: Not on file  Stress: Not on file  Social Connections: Not on file  Intimate Partner Violence: Not on file     Review of Systems    General:  No chills, fever, night sweats or weight changes.  Cardiovascular:  No chest pain, dyspnea on exertion, edema, orthopnea, palpitations, paroxysmal nocturnal dyspnea. Dermatological: No rash, lesions/masses Respiratory: No cough, dyspnea Urologic: No hematuria, dysuria Abdominal:   No nausea, vomiting, diarrhea, bright red blood per rectum, melena, or hematemesis Neurologic:  No visual changes, wkns, changes in mental status. All other systems reviewed and are otherwise negative except as noted above.  Physical Exam    VS:  BP (!) 142/92   Pulse 87   Ht 5' 7"  (1.702 m)   Wt 168 lb 3.2 oz (76.3 kg)   SpO2 93%   BMI 26.34 kg/m  , BMI Body mass index is 26.34 kg/m. GEN: Well nourished, well developed, in no acute distress. HEENT: normal. Neck: Supple, no JVD, carotid bruits, or masses. Cardiac: RRR, no murmurs, rubs, or gallops. No clubbing, cyanosis, edema.  Radials/DP/PT 2+ and equal bilaterally.  Respiratory:  Respirations regular and unlabored, clear to auscultation bilaterally. GI: Soft, nontender, nondistended, BS + x 4. MS: no deformity or atrophy. Skin: warm and dry, no rash. Neuro:  Strength and sensation are intact. Psych: Normal affect.  Accessory Clinical Findings    Recent Labs: 10/06/2019: BNP 388.2 06/26/2020: ALT 14; BUN 37; Creatinine, Ser 3.32; Hemoglobin 10.6; Platelets 330; Potassium 3.8; Sodium 136   Recent Lipid Panel    Component Value Date/Time   CHOL 176 07/27/2019 1022   TRIG 100 07/27/2019 1022    HDL 44 07/27/2019 1022   CHOLHDL 4.0 07/27/2019 1022   CHOLHDL 4.3 09/06/2014 1657   VLDL 38 09/06/2014 1657   LDLCALC 114 (H) 07/27/2019 1022    ECG personally reviewed by me today-none today.- No acute changes  Echocardiogram 06/02/2020 IMPRESSIONS    1. Left ventricular ejection fraction, by estimation, is  40 to 45%. The  left ventricle has mildly decreased function. The left ventricle  demonstrates global hypokinesis. Left ventricular diastolic parameters are  consistent with Grade I diastolic  dysfunction (impaired relaxation). Elevated left ventricular end-diastolic  pressure.  2. Right ventricular systolic function is normal. The right ventricular  size is normal. There is normal pulmonary artery systolic pressure.  3. Left atrial size was moderately dilated.  4. The mitral valve is normal in structure. Mild mitral valve  regurgitation. No evidence of mitral stenosis.  5. The aortic valve is tricuspid. Aortic valve regurgitation is not  visualized. No aortic stenosis is present.  6. The inferior vena cava is normal in size with greater than 50%  respiratory variability, suggesting right atrial pressure of 3 mmHg.  Assessment & Plan   1.  Acute on chronic systolic and diastolic CHF-+2 pitting bilateral lower extremity edema and no increased dyspnea on exertion.  Echocardiogram 06/02/2020 showed EF 40-45%, G1 DD, mild mitral valve regurgitation. Order furosemide to 40 mg daily x4 days Start potassium 20 mEq x 4 days then stop. Heart healthy low-sodium diet-salty 6 given Increase physical activity as tolerated Daily weights, contact clinic with a weight increase of 3 pounds overnight or 5 pounds in Lower extremity support stockings-[employer] support stocking sheet given Elevate lower extremities when not active Fluid restriction no more than 60-80 ounces Order BMP in 1 week  Essential hypertension-BP today 142/92.  Poorly controlled at home.  160s-170s over  90s Continue furosemide, amlodipine, carvedilol, diltiazem, hydralazine Heart healthy low-sodium diet-salty 6 given Increase physical activity as tolerated Maintain blood pressure log Avoid secondary causes of hypertension-reviewed in details given  Hyperlipidemia-07/27/2019: Cholesterol, Total 176; HDL 44; LDL Chol Calc (NIH) 114; Triglycerides 100 Continue atorvastatin Heart healthy low-sodium high-fiber diet Increase physical activity as tolerated  AKI/CKD -creatinine 3.32 on 06/26/2020.  Baseline between 1.94 and 1.67.  Patient reports he has a nephrology appointment Monday. Keep nephrology appointment.  Disposition: Follow-up with me or Dr. Gwenlyn Found in 1 week.  Jossie Ng. Marquerite Forsman NP-C    06/29/2020, 4:32 PM [facility] [place] [address] Office [phone] Fax [phone]  Notice: This dictation was prepared with Dragon dictation along with smaller phrase technology. Any transcriptional errors that result from this process are unintentional and may not be corrected upon review.  I spent 20 minutes examining this patient, reviewing medications, and using patient centered shared decision making involving her cardiac care.  Prior to her visit I spent greater than 20 minutes reviewing her past medical history,  medications, and prior cardiac tests.

## 2020-06-29 ENCOUNTER — Ambulatory Visit (INDEPENDENT_AMBULATORY_CARE_PROVIDER_SITE_OTHER): Payer: Self-pay | Admitting: General Practice

## 2020-06-29 ENCOUNTER — Encounter: Payer: Self-pay | Admitting: General Practice

## 2020-06-29 ENCOUNTER — Other Ambulatory Visit: Payer: Self-pay | Admitting: General Practice

## 2020-06-29 ENCOUNTER — Other Ambulatory Visit: Payer: Self-pay

## 2020-06-29 VITALS — BP 142/92 | HR 87 | Ht 67.0 in | Wt 168.2 lb

## 2020-06-29 DIAGNOSIS — N179 Acute kidney failure, unspecified: Secondary | ICD-10-CM

## 2020-06-29 DIAGNOSIS — I10 Essential (primary) hypertension: Secondary | ICD-10-CM

## 2020-06-29 DIAGNOSIS — I5043 Acute on chronic combined systolic (congestive) and diastolic (congestive) heart failure: Secondary | ICD-10-CM

## 2020-06-29 DIAGNOSIS — E785 Hyperlipidemia, unspecified: Secondary | ICD-10-CM

## 2020-06-29 DIAGNOSIS — R600 Localized edema: Secondary | ICD-10-CM

## 2020-06-29 DIAGNOSIS — N184 Chronic kidney disease, stage 4 (severe): Secondary | ICD-10-CM

## 2020-06-29 MED ORDER — FUROSEMIDE 40 MG PO TABS: 40.0000 mg | ORAL_TABLET | Freq: Every day | ORAL | 0 refills | Status: DC

## 2020-06-29 MED ORDER — POTASSIUM CHLORIDE CRYS ER 20 MEQ PO TBCR: 20.0000 meq | EXTENDED_RELEASE_TABLET | Freq: Every day | ORAL | 0 refills | Status: DC

## 2020-06-29 MED FILL — FUROSEMIDE 40 MG TAB: 40 | 4 days supply | Qty: 4 | Fill #0

## 2020-06-29 MED FILL — POTASSIUM CL ER 20 MEQ TAB: 20 | 4 days supply | Qty: 4 | Fill #0

## 2020-06-29 NOTE — Patient Instructions (Signed)
Medication Instructions:  Take furosemide 40 for 4 days then stop  Take potassium for 4 days then stop *If you need a refill on your cardiac medications before your next appointment, please call your pharmacy*  Lab Work: BMET IN 1 WEEK (2-24/2022) If you have labs (blood work) drawn today and your tests are completely normal, you will receive your results only by:  [place] (if you have MyChart) OR A paper copy in the mail.  If you have any lab test that is abnormal or we need to change your treatment, we will call you to review the results. You may go to any [facility] that is convenient for you however, we do have a lab in our office that is able to assist you. You DO NOT need an appointment for our lab. The lab is open 8:00am and closes at 4:00pm. Lunch 12:45 - 1:45pm.  Special Instructions FLUID RESTRICTION <64 OUNCES DAILY OF ALL FLUIDS COFFEE, TEA WATER, ETC...  PLEASE PURCHASE AND WEAR COMPRESSION STOCKINGS DAILY AND TAKE OFF AT BEDTIME. Compression stockings are elastic socks that squeeze the legs. They help to increase blood flow to the legs and to decrease swelling in the legs from fluid retention, and reduce the chance of developing blood clots in the lower legs. Please put on in the AM when dressing and off at night when dressing for bed.  LET THEM KNOW THAT YOU NEED KNEE HIGH'S WITH COMPRESSION OF 15-20 mmhg. [employer]  [place] ([place] [phone]); [place], [place] [address]; [phone]  EMAIL   [email or web address].  PLEASE MAKE SURE TO ELEVATE YOUR FEET & LEGS WHILE SITTING, THIS WILL HELP WITH THE SWELLING ALSO.   MAKE SURE TO TAKE YOUR BLOOD PRESSURE MEDICATIONS AND DIURETICS  TAKE AND LOG YOUR WEIGHT DAILY AND CALL IF >3 lbs/DAY OR 5lbs/WEEK  MAKE SURE TO CALL NEPHROLOGY(KIDNEY DOCTOR) AND MAKE AN APPOINTMENT  PLEASE READ AND FOLLOW SALTY 6-ATTACHED-1,800 mg daily  Follow-Up: Your next appointment:  1 week(s) In Person with Quay Burow, MD  OR IF UNAVAILABLE [place], FNP-C   At [facility], you and your health needs are our priority.  As part of our continuing mission to provide you with exceptional heart care, we have created designated Provider Care Teams.  These Care Teams include your primary Cardiologist (physician) and Advanced Practice Providers (APPs -  Physician Assistants and Nurse Practitioners) who all work together to provide you with the care you need, when you need it.

## 2020-06-30 MED FILL — $novoLOG FLEXPEN SYRINGE: 100 | 30 days supply | Qty: 9 | Fill #1

## 2020-06-30 MED FILL — $LEVEMIR FLEXTOUCH 100 UNIT: 100 | 25 days supply | Qty: 9 | Fill #1

## 2020-07-03 ENCOUNTER — Other Ambulatory Visit: Payer: Self-pay | Admitting: Nephrology

## 2020-07-03 MED FILL — ?FUROSEMIDE 40 MG TABLET: 40 | 30 days supply | Qty: 40 | Fill #0

## 2020-07-06 NOTE — Progress Notes (Deleted)
Cardiology Clinic Note   Patient Name: [name] Date of Encounter: 07/06/2020  Primary Care Provider:  [name], FNP Primary Cardiologist:  Quay Burow, MD  Patient Profile    [name]. [age] year old male presents the clinic today for follow-up evaluation of his lower extremity edema.  Past Medical History    Past Medical History:  Diagnosis Date  . Anxiety 02/2019  . Anxiety 10/2019  . Diabetes mellitus type II, uncontrolled ([place]) 07/17/2006  . Elevated alkaline phosphatase level 01/2019  . ESSENTIAL HYPERTENSION 07/17/2006  . HYPERLIPIDEMIA 11/24/2008  . Insomnia 02/2019  . Left bundle branch block 02/22/2019  . Lesion of lip 10/2019  . Pneumonia 10/22/2011  . Suicidal ideations 02/2019  . Vitamin D deficiency 01/2019   Past Surgical History:  Procedure Laterality Date  . ARM SURGERY      Allergies  Allergies  Allergen Reactions  . Sulfa Antibiotics Rash    Severe rash. Do not give sulfa medications  . Bactrim [Sulfamethoxazole-Trimethoprim]     Rash  . Cephalexin     Patient with severe drug reaction including exfoliating skin rash and hypotension after being prescribed both cephalexin and sulfamethoxazole-trimethoprim simultaneously. Favor SMX as much more likely culprit as patient had tolerated other cephalosporins in the past, but cannot say with complete certainty that this was not related to cephalexin.   . Other     Patient states he is allergic to the TB test    History of Present Illness    [name] has a PMH of essential hypertension, left bundle branch block, left ventricular dysfunction type 2 diabetes CKD stage III, hyperlipidemia, and lower extremity edema.  He was initially referred by his PCP for an evaluation of his dyspnea on exertion.  He reported no family history of heart disease.  He denied having previous heart attack or CVA.  He was noted to have a chronic left bundle branch block.  He reported dyspnea especially  while having sexual intercourse.  He was admitted to the hospital for hypertensive urgency 04/07/2019 after having a disagreement with his fiance.  His cardiac enzymes were low.  He had a coronary calcium score completed which showed 0 suggesting that his pain was not related to cardiac issues.  He admitted to dietary indiscretion with regards to salt and also reported medication noncompliance.  His echocardiogram 10/20 showed an EF of 40-45% which was reduced from normal EF on his previous echocardiogram 01/02/2016.  He contracted COVID-19 mid-January quarantined at home for 2 weeks.  He recovered well from his infection.  He denied chest pain or shortness of breath.  He reported taking his antihypertension medication as prescribed and avoiding salt.  He was last seen by Dr. Gwenlyn Found on 03/07/2020.  During that time he noted increased lower extremity swelling.  His blood pressure was poorly controlled (179/90).  He denied chest pain or shortness of breath.  His EKG showed sinus rhythm 94 bpm with left bundle branch block  He presented to the emergency department 06/26/2020 with complaints of progressive lower extremity edema extending to his groin and testicles.  He reported noncompliance with his medications.  His blood pressure was 150/98 with a blood glucose of 403.  He presented to the clinic today for follow-up evaluation stated he had not taken his furosemide since the summer.  He reported that after his swelling went away he stopped taking the medication.  He was also not taking his blood pressure medication.  He had his  medication filled at [facility].  He reported that he waited in the emergency department for quite some time and after not being seen he left without treatment.  He reported that his breathing was only labored with increased physical activity.  He reported that his swelling extended up to his hip.  We reviewed his lab work showing that his creatinine was elevated  to 3.32.  We reviewed that his uncontrolled blood pressure and diabetes have impacted his kidney health.  He expressed understanding. He reported he had a nephrology follow up visit on Monday.  We reviewed the importance of low-sodium diet.  I  gave him salty 6 sheet, ordered furosemide, refilled his blood pressure medication, ordered potassium, had him weigh himself daily, gave him a work note, and planned  return to the clinic in 1 week.  We will order a BMP in 1 week.  He presents the clinic today for follow-up evaluation states***  Today he denies chest pain, increased shortness of breath, increased lower extremity edema, fatigue, palpitations, melena, hematuria, hemoptysis, diaphoresis, weakness, presyncope, syncope, orthopnea, and PND.  Home Medications    Prior to Admission medications   Medication Sig Start Date End Date Taking? Authorizing Provider  amLODipine (NORVASC) 10 MG tablet Take 1 tablet (10 mg total) by mouth daily. 02/22/20 02/11/22  [name], MD  atorvastatin (LIPITOR) 40 MG tablet Take 1 tablet (40 mg total) by mouth daily. 02/23/20   [name], MD  Blood Glucose Monitoring Suppl (TRUE METRIX METER) w/Device KIT Use to measure blood sugar twice a day 02/22/20   [name], MD  carvedilol (COREG) 25 MG tablet Take 1 tablet (25 mg total) by mouth 2 (two) times daily. 03/07/20   Lorretta Harp, MD  furosemide (LASIX) 40 MG tablet Take 1 tablet (40 mg total) by mouth daily. Take for 4 days then stop 06/29/20   Deberah Pelton, NP  glucose blood test strip Use to check fasting blood sugar once daily. 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 Insulin dependent 02/22/20   [name], MD  hydrALAZINE (APRESOLINE) 25 MG tablet Take 1 tablet (25 mg total) by mouth 3 (three) times daily. 03/07/20   Lorretta Harp, MD  insulin aspart (NOVOLOG FLEXPEN) 100 UNIT/ML FlexPen Inject 10 Units into the skin 2 (two) times daily with a meal. 02/23/20   [name], MD  insulin  detemir (LEVEMIR FLEXTOUCH) 100 UNIT/ML FlexPen Inject 35 Units into the skin daily. 02/22/20   [name], MD  Insulin Pen Needle (PEN NEEDLES 29GX1/2") 29G X 12MM MISC Use with insulin pens 02/22/20   [name], MD  potassium chloride SA (KLOR-CON M20) 20 MEQ tablet Take 1 tablet (20 mEq total) by mouth daily. Take x 4 days then stop 06/29/20 09/27/20  Deberah Pelton, NP  sildenafil (VIAGRA) 25 MG tablet Take 1 tablet (25 mg total) by mouth as needed for erectile dysfunction. for erectile dysfunction. 10/13/19   [name], FNP  sitaGLIPtin (JANUVIA) 25 MG tablet Take 1 tablet (25 mg total) by mouth daily. 02/22/20   [name], MD  TRUEplus Lancets 28G MISC Use to measure blood sugar twice a day 02/22/20   [name], MD  Vitamin D, Ergocalciferol, (DRISDOL) 1.25 MG (50000 UNIT) CAPS capsule Take 50,000 Units by mouth once a week. 01/26/20   [provider]  gabapentin (NEURONTIN) 300 MG capsule Take 1 capsule (300 mg total) by mouth 3 (three) times daily.  Patient not taking: Reported on 07/27/2019 07/29/18 08/30/19  [name], FNP    Family History    Family History  Problem Relation Age of Onset  . Hypertension Mother 46  . Cancer Mother        bladder cancer  . Alcohol abuse Father   . Hypertension Brother    He indicated that his mother is alive. He indicated that his father is deceased. He indicated that the status of his brother is unknown. He indicated that his daughter is alive. He indicated that his son is alive.  Social History    Social History   Socioeconomic History  . Marital status: Legally Separated    Spouse name: Not on file  . Number of children: Not on file  . Years of education: Not on file  . Highest education level: Not on file  Occupational History  . 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 Occupation: Biochemist, clinical    Comment: Updated June 2013  . Occupation: Engineer, civil (consulting)    Comment: Began Jan 2014   Tobacco Use  . Smoking status: Never Smoker  . Smokeless tobacco: Never Used  Vaping Use  . Vaping Use: Never used  Substance and Sexual Activity  . Alcohol use: No    Alcohol/week: 0.0 standard drinks  . Drug use: No  . Sexual activity: Yes    Partners: Female  Other Topics Concern  . Not on file  Social History Narrative   Lives with his mother and brother.   Wife has had a foot amputation; is a type 1 DM.   Social Determinants of Health   Financial Resource Strain: Not on file  Food Insecurity: Not on file  Transportation Needs: Not on file  Physical Activity: Not on file  Stress: Not on file  Social Connections: Not on file  Intimate Partner Violence: Not on file     Review of Systems    General:  No chills, fever, night sweats or weight changes.  Cardiovascular:  No chest pain, dyspnea on exertion, edema, orthopnea, palpitations, paroxysmal nocturnal dyspnea. Dermatological: No rash, lesions/masses Respiratory: No cough, dyspnea Urologic: No hematuria, dysuria Abdominal:   No nausea, vomiting, diarrhea, bright red blood per rectum, melena, or hematemesis Neurologic:  No visual changes, wkns, changes in mental status. All other systems reviewed and are otherwise negative except as noted above.  Physical Exam    VS:  There were no vitals taken for this visit. , BMI There is no height or weight on file to calculate BMI. GEN: Well nourished, well developed, in no acute distress. HEENT: normal. Neck: Supple, no JVD, carotid bruits, or masses. Cardiac: RRR, no murmurs, rubs, or gallops. No clubbing, cyanosis, edema.  Radials/DP/PT 2+ and equal bilaterally.  Respiratory:  Respirations regular and unlabored, clear to auscultation bilaterally. GI: Soft, nontender, nondistended, BS + x 4. MS: no deformity or atrophy. Skin: warm and dry, no rash. Neuro:  Strength and sensation are intact. Psych: Normal affect.  Accessory Clinical Findings    Recent Labs: 10/06/2019:  BNP 388.2 06/26/2020: ALT 14; BUN 37; Creatinine, Ser 3.32; Hemoglobin 10.6; Platelets 330; Potassium 3.8; Sodium 136   Recent Lipid Panel    Component Value Date/Time   CHOL 176 07/27/2019 1022   TRIG 100 07/27/2019 1022   HDL 44 07/27/2019 1022   CHOLHDL 4.0 07/27/2019 1022   CHOLHDL 4.3 09/06/2014 1657   VLDL 38 09/06/2014 1657   LDLCALC 114 (H) 07/27/2019 1022  ECG personally reviewed by me today- *** - No acute changes  Echocardiogram 06/02/2020 IMPRESSIONS    1. Left ventricular ejection fraction, by estimation, is 40 to 45%. The  left ventricle has mildly decreased function. The left ventricle  demonstrates global hypokinesis. Left ventricular diastolic parameters are  consistent with Grade I diastolic  dysfunction (impaired relaxation). Elevated left ventricular end-diastolic  pressure.  2. Right ventricular systolic function is normal. The right ventricular  size is normal. There is normal pulmonary artery systolic pressure.  3. Left atrial size was moderately dilated.  4. The mitral valve is normal in structure. Mild mitral valve  regurgitation. No evidence of mitral stenosis.  5. The aortic valve is tricuspid. Aortic valve regurgitation is not  visualized. No aortic stenosis is present.  6. The inferior vena cava is normal in size with greater than 50%  respiratory variability, suggesting right atrial pressure of 3 mmHg.  Assessment & Plan   1.  Acute on chronic systolic and diastolic CHF-***+2 pitting bilateral lower extremity edema and no increased dyspnea on exertion.  Echocardiogram 06/02/2020 showed EF 40-45%, G1 DD, mild mitral valve regurgitation. Continue furosemide to 20 mg daily-we will defer continued furosemide dosing to nephrology ***Start potassium 20 mEq x 4 days then stop. Heart healthy low-sodium diet-salty 6 given Increase physical activity as tolerated Daily weights, contact clinic with a weight increase of 3 pounds overnight or 5 pounds  in Lower extremity support stockings-[place] support stocking sheet given Elevate lower extremities when not active Fluid restriction no more than 60-80 ounces Order BMP in 1 week  Essential hypertension-BP today ***142/92.  Poorly controlled at home.  160s-170s over 90s Continue furosemide, amlodipine, carvedilol, diltiazem, hydralazine Heart healthy low-sodium diet-salty 6 given Increase physical activity as tolerated Maintain blood pressure log Avoid secondary causes of hypertension-reviewed in details given  Hyperlipidemia-07/27/2019: Cholesterol, Total 176; HDL 44; LDL Chol Calc (NIH) 114; Triglycerides 100 Continue atorvastatin Heart healthy low-sodium high-fiber diet Increase physical activity as tolerated  AKI/CKD -creatinine 3.32 on 06/26/2020.  Baseline between 1.94 and 1.67.  Patient reports he had  nephrology appointment *** Recommend nephrology manage diuresis dosing.  Disposition: Follow-up with me or Dr. Gwenlyn Found or me in 3 months   Jossie Ng. Salim Forero NP-C    07/06/2020, 7:05 AM [address] Office [phone] Fax [phone]  Notice: This dictation was prepared with Dragon dictation along with smaller phrase technology. Any transcriptional errors that result from this process are unintentional and may not be corrected upon review.  I spent***minutes examining this patient, reviewing medications, and using patient centered shared decision making involving her cardiac care.  Prior to her visit I spent greater than 20 minutes reviewing her past medical history,  medications, and prior cardiac tests.

## 2020-07-07 ENCOUNTER — Ambulatory Visit: Payer: Self-pay | Admitting: General Practice

## 2020-07-07 ENCOUNTER — Other Ambulatory Visit: Payer: Self-pay | Admitting: Nephrology

## 2020-07-25 ENCOUNTER — Encounter (HOSPITAL_COMMUNITY): Payer: Self-pay | Admitting: Cardiology

## 2020-08-01 MED FILL — ?FUROSEMIDE 40 MG TABLET: 40 | 30 days supply | Qty: 40 | Fill #0

## 2020-08-02 ENCOUNTER — Other Ambulatory Visit: Payer: Self-pay

## 2020-08-02 ENCOUNTER — Emergency Department (HOSPITAL_COMMUNITY)
Admission: EM | Admit: 2020-08-02 | Discharge: 2020-08-02 | Disposition: A | Discharge status: Home / Self Care | Payer: Self-pay | Attending: Emergency Medicine | Admitting: Emergency Medicine

## 2020-08-02 DIAGNOSIS — I10 Essential (primary) hypertension: Secondary | ICD-10-CM | POA: Insufficient documentation

## 2020-08-02 DIAGNOSIS — Z5321 Procedure and treatment not carried out due to patient leaving prior to being seen by health care provider: Secondary | ICD-10-CM | POA: Insufficient documentation

## 2020-08-02 NOTE — ED Triage Notes (Signed)
Pt has been out of blood pressure medicine x 1 month, until picked up his rx again yesterday. Concerned bc blood pressure high yesterday and again today.

## 2020-08-02 NOTE — ED Notes (Signed)
Called x3 with no response

## 2020-08-02 NOTE — ED Notes (Signed)
Called for room x2 with no response and not visible in lobby

## 2020-08-04 ENCOUNTER — Encounter (HOSPITAL_COMMUNITY): Payer: Self-pay | Admitting: Emergency Medicine

## 2020-08-04 ENCOUNTER — Emergency Department (HOSPITAL_COMMUNITY)
Admission: EM | Admit: 2020-08-04 | Discharge: 2020-08-05 | Disposition: A | Discharge status: Home / Self Care | Payer: Self-pay | Attending: Emergency Medicine | Admitting: Emergency Medicine

## 2020-08-04 ENCOUNTER — Other Ambulatory Visit: Payer: Self-pay

## 2020-08-04 DIAGNOSIS — Z79899 Other long term (current) drug therapy: Secondary | ICD-10-CM | POA: Insufficient documentation

## 2020-08-04 DIAGNOSIS — E0865 Diabetes mellitus due to underlying condition with hyperglycemia: Secondary | ICD-10-CM

## 2020-08-04 DIAGNOSIS — R519 Headache, unspecified: Secondary | ICD-10-CM

## 2020-08-04 DIAGNOSIS — N184 Chronic kidney disease, stage 4 (severe): Secondary | ICD-10-CM

## 2020-08-04 DIAGNOSIS — M7989 Other specified soft tissue disorders: Secondary | ICD-10-CM

## 2020-08-04 DIAGNOSIS — E1122 Type 2 diabetes mellitus with diabetic chronic kidney disease: Secondary | ICD-10-CM | POA: Insufficient documentation

## 2020-08-04 DIAGNOSIS — I10 Essential (primary) hypertension: Secondary | ICD-10-CM

## 2020-08-04 DIAGNOSIS — Z794 Long term (current) use of insulin: Secondary | ICD-10-CM | POA: Insufficient documentation

## 2020-08-04 DIAGNOSIS — N183 Chronic kidney disease, stage 3 unspecified: Secondary | ICD-10-CM | POA: Insufficient documentation

## 2020-08-04 DIAGNOSIS — I129 Hypertensive chronic kidney disease with stage 1 through stage 4 chronic kidney disease, or unspecified chronic kidney disease: Secondary | ICD-10-CM | POA: Insufficient documentation

## 2020-08-04 LAB — BASIC METABOLIC PANEL
Anion gap: 6 (ref 5–15)
BUN: 42 mg/dL — ABNORMAL HIGH (ref 6–20)
CO2: 24 mmol/L (ref 22–32)
Calcium: 7.7 mg/dL — ABNORMAL LOW (ref 8.9–10.3)
Chloride: 103 mmol/L (ref 98–111)
Creatinine, Ser: 3.49 mg/dL — ABNORMAL HIGH (ref 0.61–1.24)
GFR, Estimated: 21 mL/min — ABNORMAL LOW (ref >60)
Glucose, Bld: 341 mg/dL — ABNORMAL HIGH (ref 70–99)
Potassium: 4 mmol/L (ref 3.5–5.1)
Sodium: 133 mmol/L — ABNORMAL LOW (ref 135–145)

## 2020-08-04 LAB — CBC
HCT: 35.3 % — ABNORMAL LOW (ref 39.0–52.0)
Hemoglobin: 11.4 g/dL — ABNORMAL LOW (ref 13.0–17.0)
MCH: 27.5 pg (ref 26.0–34.0)
MCHC: 32.3 g/dL (ref 30.0–36.0)
MCV: 85.3 fL (ref 80.0–100.0)
Platelets: 370 10*3/uL (ref 150–400)
RBC: 4.14 MIL/uL — ABNORMAL LOW (ref 4.22–5.81)
RDW: 13.2 % (ref 11.5–15.5)
WBC: 7 10*3/uL (ref 4.0–10.5)
nRBC: 0 % (ref 0.0–0.2)

## 2020-08-04 LAB — CBG MONITORING, ED: Glucose-Capillary: 344 mg/dL — ABNORMAL HIGH (ref 70–99)

## 2020-08-04 NOTE — ED Triage Notes (Signed)
Pt. Stated, My BP is high and probably my sugar too.  I take BP medicine , my last dose was last night Novolog 10u.

## 2020-08-05 LAB — CBG MONITORING, ED: Glucose-Capillary: 262 mg/dL — ABNORMAL HIGH (ref 70–99)

## 2020-08-05 MED ORDER — AMLODIPINE BESYLATE 5 MG PO TABS
10.0000 mg | ORAL_TABLET | Freq: Once | ORAL | Status: AC
  Administered 2020-08-05: 10 mg via ORAL
  Filled 2020-08-05: qty 2

## 2020-08-05 MED ORDER — CALCIUM CARBONATE ANTACID 500 MG PO CHEW
400.0000 mg | CHEWABLE_TABLET | Freq: Once | ORAL | Status: AC
  Administered 2020-08-05: 400 mg via ORAL
  Filled 2020-08-05: qty 2

## 2020-08-05 MED ORDER — ACETAMINOPHEN 500 MG PO TABS
1000.0000 mg | ORAL_TABLET | Freq: Once | ORAL | Status: AC
  Administered 2020-08-05: 1000 mg via ORAL
  Filled 2020-08-05: qty 2

## 2020-08-05 MED ORDER — INSULIN ASPART 100 UNIT/ML ~~LOC~~ SOLN
6.0000 [IU] | Freq: Once | SUBCUTANEOUS | Status: AC
  Administered 2020-08-05: 6 [IU] via SUBCUTANEOUS

## 2020-08-05 NOTE — ED Provider Notes (Addendum)
[facility] EMERGENCY DEPARTMENT Provider Note   CSN: [id number] Arrival date & time: 08/04/20  1911     History Chief Complaint  Patient presents with  . Headache  . Hypertension  . Hyperglycemia    [name] is a 50 y.o. male.  Patient with hx htn, dm, presents indicating his blood pressure and blood sugar have been high. Symptoms gradual onset in past few months, moderate-severe, denies acute/abrupt worsening. Pt states he does have adequate medication at home and indicates is compliant w meds.  Indicates normal appetite, not following any specific meal plan. No recent weight change. No polyuria or polydipsia. States has been in ED for many hours, so no bp meds yet today. Denies fever/chills. No vomiting. No gu c/o. Indicates mild, intermittent frontal headache. No acute, abrupt, severe or constant head pain. No neck pain or stiffness. No eye pain or change in vision. No numbness/weakness, or change in normal functional ability. No sinus drainage or pain. No uri symptoms.   The history is provided by the patient.  Headache Associated symptoms: no abdominal pain, no back pain, no cough, no diarrhea, no eye pain, no fever, no neck pain, no numbness, no sore throat, no vomiting and no weakness   Hypertension Associated symptoms include headaches. Pertinent negatives include no chest pain, no abdominal pain and no shortness of breath.  Hyperglycemia Associated symptoms: no abdominal pain, no chest pain, no confusion, no dysuria, no fever, no increased thirst, no polyuria, no shortness of breath, no vomiting and no weakness        Past Medical History:  Diagnosis Date  . Anxiety 02/2019  . Anxiety 10/2019  . Diabetes mellitus type II, uncontrolled ([place]) 07/17/2006  . Elevated alkaline phosphatase level 01/2019  . ESSENTIAL HYPERTENSION 07/17/2006  . HYPERLIPIDEMIA 11/24/2008  . Insomnia 02/2019  . Left bundle branch block 02/22/2019  . Lesion of lip 10/2019   . Pneumonia 10/22/2011  . Suicidal ideations 02/2019  . Vitamin D deficiency 01/2019    Patient Active Problem List   Diagnosis Date Noted  . Lower extremity edema 03/07/2020  . CKD (chronic kidney disease) stage 3, GFR 30-59 ml/min ([facility]) 02/22/2020  . Left ventricular dysfunction 04/27/2019  . Hemoglobin A1C greater than 9%, indicating poor diabetic control 07/29/2018  . New onset left bundle branch block (LBBB)   . 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 Hyperlipidemia LDL goal <70 11/24/2008  . Uncontrolled type 2 diabetes mellitus with peripheral neuropathy ([place]) 07/17/2006  . Essential hypertension 07/17/2006    Past Surgical History:  Procedure Laterality Date  . ARM SURGERY         Family History  Problem Relation Age of Onset  . Hypertension Mother 47  . Cancer Mother        bladder cancer  . Alcohol abuse Father   . Hypertension Brother     Social History   Tobacco Use  . Smoking status: Never Smoker  . Smokeless tobacco: Never Used  Vaping Use  . Vaping Use: Never used  Substance Use Topics  . Alcohol use: No    Alcohol/week: 0.0 standard drinks  . Drug use: No    Home Medications Prior to Admission medications   Medication Sig Start Date End Date Taking? Authorizing Provider  amLODipine (NORVASC) 10 MG tablet Take 1 tablet (10 mg total) by mouth daily. 02/22/20 02/11/22  [name], MD  atorvastatin (LIPITOR) 40 MG tablet Take 1 tablet (40 mg total) by  mouth daily. 02/23/20   [name], MD  Blood Glucose Monitoring Suppl (TRUE METRIX METER) w/Device KIT Use to measure blood sugar twice a day 02/22/20   [name], MD  carvedilol (COREG) 25 MG tablet Take 1 tablet (25 mg total) by mouth 2 (two) times daily. 03/07/20   Lorretta Harp, MD  furosemide (LASIX) 40 MG tablet Take 1 tablet (40 mg total) by mouth daily. Take for 4 days then stop 06/29/20   Deberah Pelton, NP  glucose blood test strip Use to check fasting  blood sugar once daily. 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 Insulin dependent 02/22/20   [name], MD  hydrALAZINE (APRESOLINE) 25 MG tablet Take 1 tablet (25 mg total) by mouth 3 (three) times daily. 03/07/20   Lorretta Harp, MD  insulin aspart (NOVOLOG FLEXPEN) 100 UNIT/ML FlexPen Inject 10 Units into the skin 2 (two) times daily with a meal. 02/23/20   [name], MD  insulin detemir (LEVEMIR FLEXTOUCH) 100 UNIT/ML FlexPen Inject 35 Units into the skin daily. 02/22/20   [name], MD  Insulin Pen Needle (PEN NEEDLES 29GX1/2") 29G X 12MM MISC Use with insulin pens 02/22/20   [name], MD  potassium chloride SA (KLOR-CON M20) 20 MEQ tablet Take 1 tablet (20 mEq total) by mouth daily. Take x 4 days then stop 06/29/20 09/27/20  Deberah Pelton, NP  sildenafil (VIAGRA) 25 MG tablet Take 1 tablet (25 mg total) by mouth as needed for erectile dysfunction. for erectile dysfunction. 10/13/19   [name], FNP  sitaGLIPtin (JANUVIA) 25 MG tablet Take 1 tablet (25 mg total) by mouth daily. 02/22/20   [name], MD  TRUEplus Lancets 28G MISC Use to measure blood sugar twice a day 02/22/20   [name], MD  Vitamin D, Ergocalciferol, (DRISDOL) 1.25 MG (50000 UNIT) CAPS capsule Take 50,000 Units by mouth once a week. 01/26/20   [provider]  gabapentin (NEURONTIN) 300 MG capsule Take 1 capsule (300 mg total) by mouth 3 (three) times daily. Patient not taking: Reported on 07/27/2019 07/29/18 08/30/19  [name], FNP    Allergies    Sulfa antibiotics, Bactrim [sulfamethoxazole-trimethoprim], Cephalexin, and Other  Review of Systems   Review of Systems  Constitutional: Negative for fever.  HENT: Negative for sinus pain and sore throat.   Eyes: Negative for pain, redness and visual disturbance.  Respiratory: Negative for cough and shortness of breath.   Cardiovascular: Negative for chest pain.       Denies new/worsening leg pain/swelling. + notes chronic  foot/ankle swelling bil.   Gastrointestinal: Negative for abdominal pain, diarrhea and vomiting.  Endocrine: Negative for polydipsia and polyuria.  Genitourinary: Negative for dysuria and flank pain.       Urinary normal amount. Denies gu c/o.   Musculoskeletal: Negative for back pain and neck pain.  Skin: Negative for rash.  Neurological: Positive for headaches. Negative for syncope, weakness and numbness.  Hematological: Does not bruise/bleed easily.  Psychiatric/Behavioral: Negative for confusion.    Physical Exam Updated Vital Signs BP (!) 154/91 (BP Location: Left Arm)   Pulse 87   Temp 98.7 F (37.1 C) (Oral)   Resp 16   SpO2 99%   Physical Exam Vitals and nursing note reviewed.  Constitutional:      Appearance: Normal appearance. He is well-developed.  HENT:     Head: Atraumatic.     Comments: No sinus or temporal tenderness.  Nose: Nose normal.     Mouth/Throat:     Mouth: Mucous membranes are moist.     Pharynx: Oropharynx is clear.  Eyes:     General: No scleral icterus.    Conjunctiva/sclera: Conjunctivae normal.     Pupils: Pupils are equal, round, and reactive to light.  Neck:     Trachea: No tracheal deviation.     Comments: No stiffness or rigidity. Cardiovascular:     Rate and Rhythm: Normal rate and regular rhythm.     Pulses: Normal pulses.     Heart sounds: Normal heart sounds. No murmur heard. No friction rub. No gallop.   Pulmonary:     Effort: Pulmonary effort is normal. No accessory muscle usage or respiratory distress.     Breath sounds: Normal breath sounds.  Abdominal:     General: Bowel sounds are normal. There is no distension.     Palpations: Abdomen is soft.     Tenderness: There is no abdominal tenderness.  Genitourinary:    Comments: No cva tenderness. Musculoskeletal:        General: No tenderness.     Cervical back: Normal range of motion and neck supple. No rigidity.     Comments: Mild symmetric foot/ankle edema.   Skin:     General: Skin is warm and dry.     Findings: No rash.  Neurological:     Mental Status: He is alert.     Comments: Alert, speech clear, no dysarthria or aphasia. Motor/sensation grossly intact bil. Steady gait.   Psychiatric:        Mood and Affect: Mood normal.     ED Results / Procedures / Treatments   Labs (all labs ordered are listed, but only abnormal results are displayed) Results for orders placed or performed during the hospital encounter of 65/99/35  Basic metabolic panel  Result Value Ref Range   Sodium 133 (L) 135 - 145 mmol/L   Potassium 4.0 3.5 - 5.1 mmol/L   Chloride 103 98 - 111 mmol/L   CO2 24 22 - 32 mmol/L   Glucose, Bld 341 (H) 70 - 99 mg/dL   BUN 42 (H) 6 - 20 mg/dL   Creatinine, Ser 3.49 (H) 0.61 - 1.24 mg/dL   Calcium 7.7 (L) 8.9 - 10.3 mg/dL   GFR, Estimated 21 (L) >60 mL/min   Anion gap 6 5 - 15  CBC  Result Value Ref Range   WBC 7.0 4.0 - 10.5 K/uL   RBC 4.14 (L) 4.22 - 5.81 MIL/uL   Hemoglobin 11.4 (L) 13.0 - 17.0 g/dL   HCT 35.3 (L) 39.0 - 52.0 %   MCV 85.3 80.0 - 100.0 fL   MCH 27.5 26.0 - 34.0 pg   MCHC 32.3 30.0 - 36.0 g/dL   RDW 13.2 11.5 - 15.5 %   Platelets 370 150 - 400 K/uL   nRBC 0.0 0.0 - 0.2 %  CBG monitoring, ED  Result Value Ref Range   Glucose-Capillary 344 (H) 70 - 99 mg/dL  CBG monitoring, ED  Result Value Ref Range   Glucose-Capillary 262 (H) 70 - 99 mg/dL   EKG None  Radiology No results found.  Procedures Procedures   Medications Ordered in ED Medications  amLODipine (NORVASC) tablet 10 mg (has no administration in time range)  acetaminophen (TYLENOL) tablet 1,000 mg (has no administration in time range)    ED Course  I have reviewed the triage vital signs and the nursing notes.  Pertinent labs &  imaging results that were available during my care of the patient were reviewed by me and considered in my medical decision making (see chart for details).    MDM Rules/Calculators/A&P                           Labs sent.   Reviewed nursing notes and prior charts for additional history. Appears hx chronically poorly controlled htn and dm. States has seen his doctors and renal for same.  Labs reviewed/interpreted by me - glucose high 341. Hco3 is normal. WBC normal. CKD w current result similar to most recent prior. Ca low, correct ca (using prior alb) ok.  Novolog sq.  Blood sugar and blood pressure improved from initial. bp 154/91. glucsoe 262.  Acetaminophen po. Po fluids/food.   Pt has not had any bp meds today - will give dose of his amlodipine po.   Recheck pt, resting comfortably, no apparent distress or discomfort.   Pt currently appears stable for d/c.   Rec close pcp f/u.   Return precautions provided.        Final Clinical Impression(s) / ED Diagnoses Final diagnoses:  None    Rx / DC Orders ED Discharge Orders    None           Lajean Saver, MD 08/05/20 [phone]

## 2020-08-05 NOTE — Discharge Instructions (Addendum)
It was our pleasure to provide your ER care today - we hope that you feel better.  It is very important that you work closely with your doctor to achieve better control of your blood pressure and blood sugars - uncontrolled diabetes and hypertension leads to kidney failure/need for dialysis, heart disease and strokes.  Continue your meds, keep log of your blood sugars and blood pressure, follow heart healthy diabetes eating plan, and follow up with primary care doctor this coming week - call office Monday AM for appointment.   Your kidney tests are elevated - make sure to avoid any anti-inflammatory type pain meds such as ibuprofen/motrin or naprosyn/aleve. Follow up with kidney doctor in the next few weeks.   Return to ER if worse, new symptoms, new or severe pain, severe headache, chest pain, trouble breathing, or other concern.

## 2020-08-07 ENCOUNTER — Encounter (HOSPITAL_COMMUNITY): Payer: Self-pay

## 2020-08-07 ENCOUNTER — Emergency Department (HOSPITAL_COMMUNITY)
Admission: EM | Admit: 2020-08-07 | Discharge: 2020-08-08 | Disposition: A | Discharge status: Home / Self Care | Payer: Self-pay | Attending: Emergency Medicine | Admitting: Emergency Medicine

## 2020-08-07 ENCOUNTER — Other Ambulatory Visit: Payer: Self-pay

## 2020-08-07 DIAGNOSIS — I10 Essential (primary) hypertension: Secondary | ICD-10-CM | POA: Insufficient documentation

## 2020-08-07 DIAGNOSIS — R739 Hyperglycemia, unspecified: Secondary | ICD-10-CM | POA: Insufficient documentation

## 2020-08-07 DIAGNOSIS — Z5321 Procedure and treatment not carried out due to patient leaving prior to being seen by health care provider: Secondary | ICD-10-CM | POA: Insufficient documentation

## 2020-08-07 LAB — URINALYSIS, ROUTINE W REFLEX MICROSCOPIC
Bacteria, UA: NONE SEEN
Bilirubin Urine: NEGATIVE
Glucose, UA: >=500 mg/dL — AB
Ketones, ur: NEGATIVE mg/dL
Leukocytes,Ua: NEGATIVE
Nitrite: NEGATIVE
Protein, ur: >=300 mg/dL — AB
Specific Gravity, Urine: 1.015 (ref 1.005–1.030)
pH: 5 (ref 5.0–8.0)

## 2020-08-07 LAB — CBC
HCT: 32.5 % — ABNORMAL LOW (ref 39.0–52.0)
Hemoglobin: 10.8 g/dL — ABNORMAL LOW (ref 13.0–17.0)
MCH: 27.7 pg (ref 26.0–34.0)
MCHC: 33.2 g/dL (ref 30.0–36.0)
MCV: 83.3 fL (ref 80.0–100.0)
Platelets: 344 10*3/uL (ref 150–400)
RBC: 3.9 MIL/uL — ABNORMAL LOW (ref 4.22–5.81)
RDW: 13.2 % (ref 11.5–15.5)
WBC: 6.4 10*3/uL (ref 4.0–10.5)
nRBC: 0 % (ref 0.0–0.2)

## 2020-08-07 LAB — BASIC METABOLIC PANEL
Anion gap: 6 (ref 5–15)
BUN: 43 mg/dL — ABNORMAL HIGH (ref 6–20)
CO2: 24 mmol/L (ref 22–32)
Calcium: 7.5 mg/dL — ABNORMAL LOW (ref 8.9–10.3)
Chloride: 104 mmol/L (ref 98–111)
Creatinine, Ser: 3.31 mg/dL — ABNORMAL HIGH (ref 0.61–1.24)
GFR, Estimated: 22 mL/min — ABNORMAL LOW (ref >60)
Glucose, Bld: 243 mg/dL — ABNORMAL HIGH (ref 70–99)
Potassium: 3.7 mmol/L (ref 3.5–5.1)
Sodium: 134 mmol/L — ABNORMAL LOW (ref 135–145)

## 2020-08-07 LAB — CBG MONITORING, ED: Glucose-Capillary: 230 mg/dL — ABNORMAL HIGH (ref 70–99)

## 2020-08-07 NOTE — ED Triage Notes (Signed)
Pt reports his BP and Blood sugar has been high and states he take his BP meds and insulin and states for some reason it keeps staying "high". Pt reports he took his blood pressure medication today but not his insulin

## 2020-08-08 ENCOUNTER — Encounter (HOSPITAL_COMMUNITY): Payer: Self-pay

## 2020-08-08 ENCOUNTER — Emergency Department (HOSPITAL_COMMUNITY)
Admission: EM | Admit: 2020-08-08 | Discharge: 2020-08-09 | Disposition: A | Discharge status: Home / Self Care | Payer: Self-pay | Attending: Emergency Medicine | Admitting: Emergency Medicine

## 2020-08-08 DIAGNOSIS — Z79899 Other long term (current) drug therapy: Secondary | ICD-10-CM | POA: Insufficient documentation

## 2020-08-08 DIAGNOSIS — Z794 Long term (current) use of insulin: Secondary | ICD-10-CM | POA: Insufficient documentation

## 2020-08-08 DIAGNOSIS — M7989 Other specified soft tissue disorders: Secondary | ICD-10-CM

## 2020-08-08 DIAGNOSIS — I10 Essential (primary) hypertension: Secondary | ICD-10-CM

## 2020-08-08 DIAGNOSIS — I509 Heart failure, unspecified: Secondary | ICD-10-CM | POA: Insufficient documentation

## 2020-08-08 DIAGNOSIS — I130 Hypertensive heart and chronic kidney disease with heart failure and stage 1 through stage 4 chronic kidney disease, or unspecified chronic kidney disease: Secondary | ICD-10-CM | POA: Insufficient documentation

## 2020-08-08 DIAGNOSIS — E1122 Type 2 diabetes mellitus with diabetic chronic kidney disease: Secondary | ICD-10-CM | POA: Insufficient documentation

## 2020-08-08 DIAGNOSIS — R600 Localized edema: Secondary | ICD-10-CM | POA: Insufficient documentation

## 2020-08-08 DIAGNOSIS — N183 Chronic kidney disease, stage 3 unspecified: Secondary | ICD-10-CM | POA: Insufficient documentation

## 2020-08-08 NOTE — ED Notes (Signed)
Pt called 3x no answer  

## 2020-08-08 NOTE — ED Triage Notes (Signed)
Pt reports high blood pressure swelling and states he did not take his meds due to it being at his mothers house

## 2020-08-09 ENCOUNTER — Other Ambulatory Visit: Payer: Self-pay

## 2020-08-09 ENCOUNTER — Encounter (HOSPITAL_COMMUNITY): Payer: Self-pay | Admitting: *Deleted

## 2020-08-09 ENCOUNTER — Emergency Department (HOSPITAL_COMMUNITY): Payer: Self-pay

## 2020-08-09 ENCOUNTER — Emergency Department (HOSPITAL_COMMUNITY)
Admission: EM | Admit: 2020-08-09 | Discharge: 2020-08-10 | Disposition: A | Discharge status: Home / Self Care | Payer: Self-pay | Attending: Emergency Medicine | Admitting: Emergency Medicine

## 2020-08-09 DIAGNOSIS — I129 Hypertensive chronic kidney disease with stage 1 through stage 4 chronic kidney disease, or unspecified chronic kidney disease: Secondary | ICD-10-CM | POA: Insufficient documentation

## 2020-08-09 DIAGNOSIS — E1140 Type 2 diabetes mellitus with diabetic neuropathy, unspecified: Secondary | ICD-10-CM | POA: Insufficient documentation

## 2020-08-09 DIAGNOSIS — Z7984 Long term (current) use of oral hypoglycemic drugs: Secondary | ICD-10-CM | POA: Insufficient documentation

## 2020-08-09 DIAGNOSIS — Z794 Long term (current) use of insulin: Secondary | ICD-10-CM | POA: Insufficient documentation

## 2020-08-09 DIAGNOSIS — N183 Chronic kidney disease, stage 3 unspecified: Secondary | ICD-10-CM | POA: Insufficient documentation

## 2020-08-09 DIAGNOSIS — M7989 Other specified soft tissue disorders: Secondary | ICD-10-CM | POA: Insufficient documentation

## 2020-08-09 DIAGNOSIS — Z79899 Other long term (current) drug therapy: Secondary | ICD-10-CM | POA: Insufficient documentation

## 2020-08-09 DIAGNOSIS — E1122 Type 2 diabetes mellitus with diabetic chronic kidney disease: Secondary | ICD-10-CM | POA: Insufficient documentation

## 2020-08-09 DIAGNOSIS — R0602 Shortness of breath: Secondary | ICD-10-CM | POA: Insufficient documentation

## 2020-08-09 DIAGNOSIS — R600 Localized edema: Secondary | ICD-10-CM

## 2020-08-09 LAB — CBC WITH DIFFERENTIAL/PLATELET
Abs Immature Granulocytes: 0.02 10*3/uL (ref 0.00–0.07)
Basophils Absolute: 0 10*3/uL (ref 0.0–0.1)
Basophils Relative: 0 %
Eosinophils Absolute: 0.2 10*3/uL (ref 0.0–0.5)
Eosinophils Relative: 4 %
HCT: 31.3 % — ABNORMAL LOW (ref 39.0–52.0)
Hemoglobin: 10.3 g/dL — ABNORMAL LOW (ref 13.0–17.0)
Immature Granulocytes: 0 %
Lymphocytes Relative: 28 %
Lymphs Abs: 1.6 10*3/uL (ref 0.7–4.0)
MCH: 27.3 pg (ref 26.0–34.0)
MCHC: 32.9 g/dL (ref 30.0–36.0)
MCV: 83 fL (ref 80.0–100.0)
Monocytes Absolute: 0.6 10*3/uL (ref 0.1–1.0)
Monocytes Relative: 10 %
Neutro Abs: 3.3 10*3/uL (ref 1.7–7.7)
Neutrophils Relative %: 58 %
Platelets: 336 10*3/uL (ref 150–400)
RBC: 3.77 MIL/uL — ABNORMAL LOW (ref 4.22–5.81)
RDW: 13.1 % (ref 11.5–15.5)
WBC: 5.8 10*3/uL (ref 4.0–10.5)
nRBC: 0 % (ref 0.0–0.2)

## 2020-08-09 LAB — BASIC METABOLIC PANEL
Anion gap: 6 (ref 5–15)
BUN: 44 mg/dL — ABNORMAL HIGH (ref 6–20)
CO2: 23 mmol/L (ref 22–32)
Calcium: 7.3 mg/dL — ABNORMAL LOW (ref 8.9–10.3)
Chloride: 107 mmol/L (ref 98–111)
Creatinine, Ser: 3.32 mg/dL — ABNORMAL HIGH (ref 0.61–1.24)
GFR, Estimated: 22 mL/min — ABNORMAL LOW (ref >60)
Glucose, Bld: 271 mg/dL — ABNORMAL HIGH (ref 70–99)
Potassium: 3.3 mmol/L — ABNORMAL LOW (ref 3.5–5.1)
Sodium: 136 mmol/L (ref 135–145)

## 2020-08-09 LAB — BRAIN NATRIURETIC PEPTIDE: B Natriuretic Peptide: 323 pg/mL — ABNORMAL HIGH (ref 0.0–100.0)

## 2020-08-09 MED ORDER — FUROSEMIDE 20 MG PO TABS
60.0000 mg | ORAL_TABLET | Freq: Once | ORAL | Status: AC
  Administered 2020-08-09: 60 mg via ORAL
  Filled 2020-08-09: qty 3

## 2020-08-09 MED ORDER — AMLODIPINE BESYLATE 5 MG PO TABS
10.0000 mg | ORAL_TABLET | Freq: Once | ORAL | Status: AC
  Administered 2020-08-09: 10 mg via ORAL
  Filled 2020-08-09: qty 2

## 2020-08-09 MED ORDER — CARVEDILOL 3.125 MG PO TABS
25.0000 mg | ORAL_TABLET | Freq: Once | ORAL | Status: AC
  Administered 2020-08-09: 25 mg via ORAL
  Filled 2020-08-09: qty 8

## 2020-08-09 MED ORDER — HYDRALAZINE HCL 25 MG PO TABS
25.0000 mg | ORAL_TABLET | Freq: Once | ORAL | Status: AC
  Administered 2020-08-09: 25 mg via ORAL
  Filled 2020-08-09: qty 1

## 2020-08-09 MED ORDER — POTASSIUM CHLORIDE CRYS ER 20 MEQ PO TBCR
40.0000 meq | EXTENDED_RELEASE_TABLET | Freq: Once | ORAL | Status: AC
  Administered 2020-08-09: 40 meq via ORAL
  Filled 2020-08-09: qty 2

## 2020-08-09 NOTE — ED Provider Notes (Signed)
[facility] EMERGENCY DEPARTMENT Provider Note   CSN: [id number] Arrival date & time: 08/08/20  2247     History Chief Complaint  Patient presents with  . Leg Swelling  . Hypertension    [name] is a 50 y.o. male with PMHx HTN, HLD, Diabetes, CHF with EF 40-45% who presents to the ED Today with continued complaint of high blood pressure and bilateral feet swelling. Pt was seen in the ED on 03/18 for same complaints. Labwork including CBC and BMP unremarkable at that time. Pt was provided with dose of his antihypertensive medication with good improvement and he was discharged home with PCP follow up. Pt returned to the ED on 03/21 for same however LWBS. Pt states that since then his blood pressure continues to be elevated despite his reported compliance however pt cannot tell me what blood pressure medication he is on. Per chart review pt is on Amlodipine 10 mg, Carvedilol 25 mg BID, and Hydralazine 25 mg TID. He is also prescribed Lasix which he reports compliance on. Blood pressure on arrival to the ED today 184/107. Pt reports that his blood pressure has been persistently as high as that at home. He denies any headache currently, chest pain, shortness of breath, or any other associated symptoms. Pt states he has an appointment with his PCP on Friday (2 days).   The history is provided by the patient and medical records.       Past Medical History:  Diagnosis Date  . Anxiety 02/2019  . Anxiety 10/2019  . Diabetes mellitus type II, uncontrolled ([place]) 07/17/2006  . Elevated alkaline phosphatase level 01/2019  . ESSENTIAL HYPERTENSION 07/17/2006  . HYPERLIPIDEMIA 11/24/2008  . Insomnia 02/2019  . Left bundle branch block 02/22/2019  . Lesion of lip 10/2019  . Pneumonia 10/22/2011  . Suicidal ideations 02/2019  . Vitamin D deficiency 01/2019    Patient Active Problem List   Diagnosis Date Noted  . Lower extremity edema 03/07/2020  . CKD (chronic kidney  disease) stage 3, GFR 30-59 ml/min (HCC) 02/22/2020  . Left ventricular dysfunction 04/27/2019  . Hemoglobin A1C greater than 9%, indicating poor diabetic control 07/29/2018  . New onset left bundle branch block (LBBB)   . 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 Hyperlipidemia LDL goal <70 11/24/2008  . Uncontrolled type 2 diabetes mellitus with peripheral neuropathy ([place]) 07/17/2006  . Essential hypertension 07/17/2006    Past Surgical History:  Procedure Laterality Date  . ARM SURGERY         Family History  Problem Relation Age of Onset  . Hypertension Mother 53  . Cancer Mother        bladder cancer  . Alcohol abuse Father   . Hypertension Brother     Social History   Tobacco Use  . Smoking status: Never Smoker  . Smokeless tobacco: Never Used  Vaping Use  . Vaping Use: Never used  Substance Use Topics  . Alcohol use: No    Alcohol/week: 0.0 standard drinks  . Drug use: No    Home Medications Prior to Admission medications   Medication Sig Start Date End Date Taking? Authorizing Provider  amLODipine (NORVASC) 10 MG tablet Take 1 tablet (10 mg total) by mouth daily. 02/22/20 02/11/22  [name], MD  atorvastatin (LIPITOR) 40 MG tablet Take 1 tablet (40 mg total) by mouth daily. 02/23/20   [name], MD  Blood Glucose Monitoring Suppl (TRUE METRIX METER) w/Device KIT Use  to measure blood sugar twice a day 02/22/20   [name], MD  carvedilol (COREG) 25 MG tablet Take 1 tablet (25 mg total) by mouth 2 (two) times daily. 03/07/20   Lorretta Harp, MD  furosemide (LASIX) 40 MG tablet Take 1 tablet (40 mg total) by mouth daily. Take for 4 days then stop 06/29/20   Deberah Pelton, NP  glucose blood test strip Use to check fasting blood sugar once daily. 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 Insulin dependent 02/22/20   [name], MD  hydrALAZINE (APRESOLINE) 25 MG tablet Take 1 tablet (25 mg total) by mouth 3 (three) times daily. 03/07/20   Lorretta Harp, MD  insulin aspart (NOVOLOG FLEXPEN) 100 UNIT/ML FlexPen Inject 10 Units into the skin 2 (two) times daily with a meal. 02/23/20   [name], MD  insulin detemir (LEVEMIR FLEXTOUCH) 100 UNIT/ML FlexPen Inject 35 Units into the skin daily. 02/22/20   [name], MD  Insulin Pen Needle (PEN NEEDLES 29GX1/2") 29G X 12MM MISC Use with insulin pens 02/22/20   [name], MD  potassium chloride SA (KLOR-CON M20) 20 MEQ tablet Take 1 tablet (20 mEq total) by mouth daily. Take x 4 days then stop 06/29/20 09/27/20  Deberah Pelton, NP  sildenafil (VIAGRA) 25 MG tablet Take 1 tablet (25 mg total) by mouth as needed for erectile dysfunction. for erectile dysfunction. 10/13/19   [name], FNP  sitaGLIPtin (JANUVIA) 25 MG tablet Take 1 tablet (25 mg total) by mouth daily. 02/22/20   [name], MD  TRUEplus Lancets 28G MISC Use to measure blood sugar twice a day 02/22/20   [name], MD  Vitamin D, Ergocalciferol, (DRISDOL) 1.25 MG (50000 UNIT) CAPS capsule Take 50,000 Units by mouth once a week. 01/26/20   [provider]  gabapentin (NEURONTIN) 300 MG capsule Take 1 capsule (300 mg total) by mouth 3 (three) times daily. Patient not taking: Reported on 07/27/2019 07/29/18 08/30/19  [name], FNP    Allergies    Sulfa antibiotics, Bactrim [sulfamethoxazole-trimethoprim], Cephalexin, and Other  Review of Systems   Review of Systems  Respiratory: Negative for shortness of breath.   Cardiovascular: Positive for leg swelling. Negative for chest pain.  Neurological: Negative for headaches.  All other systems reviewed and are negative.   Physical Exam Updated Vital Signs BP (!) 154/85   Pulse 68   Temp 98.2 F (36.8 C)   Resp 17   SpO2 98%   Physical Exam Vitals and nursing note reviewed.  Constitutional:      Appearance: He is not ill-appearing.     Comments: Sleeping comfortably with sheet covering entire body including face. Easily  arousable.   HENT:     Head: Normocephalic and atraumatic.  Eyes:     Conjunctiva/sclera: Conjunctivae normal.  Cardiovascular:     Rate and Rhythm: Normal rate and regular rhythm.     Pulses: Normal pulses.  Pulmonary:     Effort: Pulmonary effort is normal.     Breath sounds: Normal breath sounds. No wheezing, rhonchi or rales.  Abdominal:     Palpations: Abdomen is soft.     Tenderness: There is no abdominal tenderness.  Musculoskeletal:     Cervical back: Neck supple.     Right lower leg: Edema present.     Left lower leg: Edema present.     Comments: Bilateral nonpitting edema of the feet and ankles. 2+ DP Pulses.  Skin:    General: Skin is warm and dry.  Neurological:     Mental Status: He is alert.     ED Results / Procedures / Treatments   Labs (all labs ordered are listed, but only abnormal results are displayed) Labs Reviewed  BASIC METABOLIC PANEL - Abnormal; Notable for the following components:      Result Value   Potassium 3.3 (*)    Glucose, Bld 271 (*)    BUN 44 (*)    Creatinine, Ser 3.32 (*)    Calcium 7.3 (*)    GFR, Estimated 22 (*)    All other components within normal limits  CBC WITH DIFFERENTIAL/PLATELET - Abnormal; Notable for the following components:   RBC 3.77 (*)    Hemoglobin 10.3 (*)    HCT 31.3 (*)    All other components within normal limits  BRAIN NATRIURETIC PEPTIDE - Abnormal; Notable for the following components:   B Natriuretic Peptide 323.0 (*)    All other components within normal limits    EKG EKG Interpretation  Date/Time:  Wednesday August 09 2020 07:23:12 EDT Ventricular Rate:  87 PR Interval:  158 QRS Duration: 144 QT Interval:  432 QTC Calculation: 519 R Axis:   128 Text Interpretation: Normal sinus rhythm Right axis deviation Left bundle branch block No significant change since last tracing Confirmed by Blanchie Dessert [phone]) on 08/09/2020 8:30:16 AM   Radiology DG Chest 2 View  Result Date:  08/09/2020 CLINICAL DATA:  History of CHF.  Worsening leg swelling. EXAM: CHEST - 2 VIEW COMPARISON:  06/05/2019. FINDINGS: Mediastinum and hilar structures normal. Slight progression of cardiomegaly. Mild pulmonary venous congestion. Mild basilar Kerley B-lines noted consistent with interstitial edema. No pleural effusion or pneumothorax. No acute bony abnormality. IMPRESSION: Slight progression of cardiomegaly. Mild pulmonary venous congestion. Mild basilar Kerley B-lines consistent with mild basilar interstitial edema. Findings consistent with mild CHF. Electronically Signed   By: Marcello Moores  Register   On: 08/09/2020 07:42    Procedures Procedures   Medications Ordered in ED Medications  furosemide (LASIX) tablet 60 mg (60 mg Oral Given 08/09/20 0919)  potassium chloride SA (KLOR-CON) CR tablet 40 mEq (40 mEq Oral Given 08/09/20 0920)  amLODipine (NORVASC) tablet 10 mg (10 mg Oral Given 08/09/20 0940)  carvedilol (COREG) tablet 25 mg (25 mg Oral Given 08/09/20 0940)  hydrALAZINE (APRESOLINE) tablet 25 mg (25 mg Oral Given 08/09/20 0940)    ED Course  I have reviewed the triage vital signs and the nursing notes.  Pertinent labs & imaging results that were available during my care of the patient were reviewed by me and considered in my medical decision making (see chart for details).  Clinical Course as of 08/09/20 1020  Wed Aug 09, 2020  0928 BP(!): 173/94 [MV]    Clinical Course User Index [MV] Eustaquio Maize, PA-C   MDM Rules/Calculators/A&P                          50 year old male presents to the ED today with complaint of recurrent high blood pressure and continued leg swelling.  Seen in the ED on 3/18 for same with a reassuring work-up.  He was provided with a dose of his blood pressure medication with improvement and discharged home.  He returned to the ED 2 days ago for same however left prior to being seen and is here today for same.  It does appear he has a history of recurrent  noncompliance however patient states he is taking all of his medications as prescribed.  He has a follow-up appointment with his PCP in 2 days time.  His only current complaint is bilateral foot swelling.  He denies any chest pain or shortness of breath.  It does appear he has a history of CHF and is prescribed Lasix which he states he has been taking.  He denies any increase in weight or abdominal distention.  On arrival to the ED blood pressure is elevated at 184/107.  Recheck while he is in the waiting room at 136/86 and then again 154/85.  Remainder of vitals are stable.  My exam patient is resting comfortably with blanket covering his entire body and face however easily arousable.  He states he is here today for his elevated blood pressure.  It does appear he had lab work done 2 days ago which was unremarkable.  Stable kidney function. Will plan to repeat labs today however in the setting of his bilateral lower extremity edema will also obtain a BNP and CXR to rule out CHF exacerbation although I suspect leg swelling is related to noncompliance unfortunately.   CBC without leukocytosis. Hgb stable at 10.3 BMP with creatinine 3.32 which is pt's baseline. Glucose elevated 271; bicarb normal at 23; no gap.  Potassium low at 3.3; will replete.   CXR with findings of mild CHF BNP elevated at 323 however do not have baseline to compare to. Given CXR findings will provide lasix at this time. Given pt has no complaints of SOB do not feel he needs IV diuresis today; will provide 60 mg lasix orally (regular dose of 40 mg at home however suspect pt has not been taking lasix due to his leg swelling today).   Repeat blood pressure elevated at 173/94. Provided regular home doses of BP meds with decrease to 156/89.   On repeat evaluation pt is sitting up in bed eating a sandwich. He reports he has urinated twice since being given lasix. HE reports he has his regular 40 mg Lasix at home. Will discharge at this time  with instructions to continue taking his medications as prescribed and to see his PCP as scheduled on Friday. He is in agreement with plan and stable for discharge home.   This note was prepared using Dragon voice recognition software and may include unintentional dictation errors due to the inherent limitations of voice recognition software.  Final Clinical Impression(s) / ED Diagnoses Final diagnoses:  Primary hypertension  Leg swelling  Congestive heart failure, unspecified HF chronicity, unspecified heart failure type ([place])    Rx / DC Orders ED Discharge Orders    None       Discharge Instructions     Please continue taking your blood pressure medications and fluid pill (lasix) as prescribed to help stabilize your blood pressure and to help with your leg swelling.   Follow up with your PCP as scheduled for Friday as they may need to adjust some medications  Return to the ED for any worsening symptoms       Eustaquio Maize, PA-C 08/09/20 1021    Blanchie Dessert, MD 08/09/20 [phone]

## 2020-08-09 NOTE — Discharge Instructions (Addendum)
Please continue taking your blood pressure medications and fluid pill (lasix) as prescribed to help stabilize your blood pressure and to help with your leg swelling.   Follow up with your PCP as scheduled for Friday as they may need to adjust some medications  Return to the ED for any worsening symptoms

## 2020-08-09 NOTE — ED Triage Notes (Signed)
Pt c/o bilateral leg swelling, has been seen several times for same

## 2020-08-10 ENCOUNTER — Emergency Department (HOSPITAL_COMMUNITY): Payer: Self-pay

## 2020-08-10 LAB — CBC WITH DIFFERENTIAL/PLATELET
Abs Immature Granulocytes: 0.01 10*3/uL (ref 0.00–0.07)
Basophils Absolute: 0 10*3/uL (ref 0.0–0.1)
Basophils Relative: 0 %
Eosinophils Absolute: 0.2 10*3/uL (ref 0.0–0.5)
Eosinophils Relative: 4 %
HCT: 32.4 % — ABNORMAL LOW (ref 39.0–52.0)
Hemoglobin: 10.5 g/dL — ABNORMAL LOW (ref 13.0–17.0)
Immature Granulocytes: 0 %
Lymphocytes Relative: 28 %
Lymphs Abs: 1.6 10*3/uL (ref 0.7–4.0)
MCH: 27.1 pg (ref 26.0–34.0)
MCHC: 32.4 g/dL (ref 30.0–36.0)
MCV: 83.7 fL (ref 80.0–100.0)
Monocytes Absolute: 0.5 10*3/uL (ref 0.1–1.0)
Monocytes Relative: 9 %
Neutro Abs: 3.5 10*3/uL (ref 1.7–7.7)
Neutrophils Relative %: 59 %
Platelets: 326 10*3/uL (ref 150–400)
RBC: 3.87 MIL/uL — ABNORMAL LOW (ref 4.22–5.81)
RDW: 13.2 % (ref 11.5–15.5)
WBC: 5.9 10*3/uL (ref 4.0–10.5)
nRBC: 0 % (ref 0.0–0.2)

## 2020-08-10 LAB — BASIC METABOLIC PANEL
Anion gap: 6 (ref 5–15)
BUN: 43 mg/dL — ABNORMAL HIGH (ref 6–20)
CO2: 23 mmol/L (ref 22–32)
Calcium: 7.5 mg/dL — ABNORMAL LOW (ref 8.9–10.3)
Chloride: 106 mmol/L (ref 98–111)
Creatinine, Ser: 3.27 mg/dL — ABNORMAL HIGH (ref 0.61–1.24)
GFR, Estimated: 23 mL/min — ABNORMAL LOW (ref >60)
Glucose, Bld: 419 mg/dL — ABNORMAL HIGH (ref 70–99)
Potassium: 3.5 mmol/L (ref 3.5–5.1)
Sodium: 135 mmol/L (ref 135–145)

## 2020-08-10 LAB — BRAIN NATRIURETIC PEPTIDE: B Natriuretic Peptide: 248.7 pg/mL — ABNORMAL HIGH (ref 0.0–100.0)

## 2020-08-10 LAB — TROPONIN I (HIGH SENSITIVITY): Troponin I (High Sensitivity): 49 ng/L — ABNORMAL HIGH (ref <18)

## 2020-08-10 MED ORDER — FUROSEMIDE 40 MG PO TABS: 40.0000 mg | ORAL_TABLET | Freq: Every day | ORAL | 0 refills | Status: DC

## 2020-08-10 MED ORDER — FUROSEMIDE 20 MG PO TABS
40.0000 mg | ORAL_TABLET | Freq: Once | ORAL | Status: AC
  Administered 2020-08-10: 40 mg via ORAL
  Filled 2020-08-10: qty 2

## 2020-08-10 MED ORDER — FUROSEMIDE 10 MG/ML IJ SOLN: 40.0000 mg | INTRAMUSCULAR | Status: DC

## 2020-08-10 NOTE — ED Provider Notes (Signed)
[facility] DEPARTMENT Provider Note   CSN: [id number] Arrival date & time: 08/09/20  2344     History Chief Complaint  Patient presents with  . Leg Swelling    [name] is a 50 y.o. male.  Patient presents to the emergency department with a chief complaint of shortness of breath and bilateral lower extremity swelling.  He states that this is not a new problem, but it is worse than normal.  Denies fevers or chills.  Denies chest pain.  States that he has been taking Lasix, but it has not been helping.  He has been seen several times for the same.  The history is provided by the patient. No language interpreter was used.       Past Medical History:  Diagnosis Date  . Anxiety 02/2019  . Anxiety 10/2019  . Diabetes mellitus type II, uncontrolled ([place]) 07/17/2006  . Elevated alkaline phosphatase level 01/2019  . ESSENTIAL HYPERTENSION 07/17/2006  . HYPERLIPIDEMIA 11/24/2008  . Insomnia 02/2019  . Left bundle branch block 02/22/2019  . Lesion of lip 10/2019  . Pneumonia 10/22/2011  . Suicidal ideations 02/2019  . Vitamin D deficiency 01/2019    Patient Active Problem List   Diagnosis Date Noted  . Lower extremity edema 03/07/2020  . CKD (chronic kidney disease) stage 3, GFR 30-59 ml/min ([facility]CC) 02/22/2020  . Left ventricular dysfunction 04/27/2019  . Hemoglobin A1C greater than 9%, indicating poor diabetic control 07/29/2018  . New onset left bundle branch block (LBBB)   . 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 Hyperlipidemia LDL goal <70 11/24/2008  . Uncontrolled type 2 diabetes mellitus with peripheral neuropathy ([place]) 07/17/2006  . Essential hypertension 07/17/2006    Past Surgical History:  Procedure Laterality Date  . ARM SURGERY         Family History  Problem Relation Age of Onset  . Hypertension Mother 26  . Cancer Mother        bladder cancer  . Alcohol abuse Father   . Hypertension Brother     Social  History   Tobacco Use  . Smoking status: Never Smoker  . Smokeless tobacco: Never Used  Vaping Use  . Vaping Use: Never used  Substance Use Topics  . Alcohol use: No    Alcohol/week: 0.0 standard drinks  . Drug use: No    Home Medications Prior to Admission medications   Medication Sig Start Date End Date Taking? Authorizing Provider  amLODipine (NORVASC) 10 MG tablet Take 1 tablet (10 mg total) by mouth daily. 02/22/20 02/11/22  [name], MD  atorvastatin (LIPITOR) 40 MG tablet Take 1 tablet (40 mg total) by mouth daily. 02/23/20   [name], MD  Blood Glucose Monitoring Suppl (TRUE METRIX METER) w/Device KIT Use to measure blood sugar twice a day 02/22/20   [name], MD  carvedilol (COREG) 25 MG tablet Take 1 tablet (25 mg total) by mouth 2 (two) times daily. 03/07/20   Lorretta Harp, MD  furosemide (LASIX) 40 MG tablet Take 1 tablet (40 mg total) by mouth daily. Take for 4 days then stop 06/29/20   Deberah Pelton, NP  glucose blood test strip Use to check fasting blood sugar once daily. 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 Insulin dependent 02/22/20   [name], MD  hydrALAZINE (APRESOLINE) 25 MG tablet Take 1 tablet (25 mg total) by mouth 3 (three) times daily. 03/07/20   Lorretta Harp, MD  insulin  aspart (NOVOLOG FLEXPEN) 100 UNIT/ML FlexPen Inject 10 Units into the skin 2 (two) times daily with a meal. 02/23/20   [name], MD  insulin detemir (LEVEMIR FLEXTOUCH) 100 UNIT/ML FlexPen Inject 35 Units into the skin daily. 02/22/20   [name], MD  Insulin Pen Needle (PEN NEEDLES 29GX1/2") 29G X 12MM MISC Use with insulin pens 02/22/20   [name], MD  potassium chloride SA (KLOR-CON M20) 20 MEQ tablet Take 1 tablet (20 mEq total) by mouth daily. Take x 4 days then stop 06/29/20 09/27/20  Deberah Pelton, NP  sildenafil (VIAGRA) 25 MG tablet Take 1 tablet (25 mg total) by mouth as needed for erectile dysfunction. for erectile dysfunction. 10/13/19    [name], FNP  sitaGLIPtin (JANUVIA) 25 MG tablet Take 1 tablet (25 mg total) by mouth daily. 02/22/20   [name], MD  TRUEplus Lancets 28G MISC Use to measure blood sugar twice a day 02/22/20   [name], MD  Vitamin D, Ergocalciferol, (DRISDOL) 1.25 MG (50000 UNIT) CAPS capsule Take 50,000 Units by mouth once a week. 01/26/20   [provider]  gabapentin (NEURONTIN) 300 MG capsule Take 1 capsule (300 mg total) by mouth 3 (three) times daily. Patient not taking: Reported on 07/27/2019 07/29/18 08/30/19  [name], FNP    Allergies    Sulfa antibiotics, Bactrim [sulfamethoxazole-trimethoprim], Cephalexin, and Other  Review of Systems   Review of Systems  All other systems reviewed and are negative.   Physical Exam Updated Vital Signs BP (!) 181/98 (BP Location: Left Arm)   Pulse 91   Temp 98.4 F (36.9 C) (Oral)   Resp 18   SpO2 100%   Physical Exam Vitals and nursing note reviewed.  Constitutional:      Appearance: He is well-developed.  HENT:     Head: Normocephalic and atraumatic.  Eyes:     Conjunctiva/sclera: Conjunctivae normal.  Cardiovascular:     Rate and Rhythm: Normal rate and regular rhythm.     Heart sounds: No murmur heard.   Pulmonary:     Effort: Pulmonary effort is normal. No respiratory distress.     Breath sounds: Normal breath sounds.  Abdominal:     Palpations: Abdomen is soft.     Tenderness: There is no abdominal tenderness.  Musculoskeletal:     Cervical back: Neck supple.     Right lower leg: Edema present.     Left lower leg: Edema present.     Comments: 2+ pitting edema bilaterally  Skin:    General: Skin is warm and dry.  Neurological:     Mental Status: He is alert and oriented to person, place, and time.  Psychiatric:        Thought Content: Thought content normal.     ED Results / Procedures / Treatments   Labs (all labs ordered are listed, but only abnormal results are displayed) Labs  Reviewed  CBC WITH DIFFERENTIAL/PLATELET - Abnormal; Notable for the following components:      Result Value   RBC 3.87 (*)    Hemoglobin 10.5 (*)    HCT 32.4 (*)    All other components within normal limits  BASIC METABOLIC PANEL - Abnormal; Notable for the following components:   Glucose, Bld 419 (*)    BUN 43 (*)    Creatinine, Ser 3.27 (*)    Calcium 7.5 (*)    GFR, Estimated 23 (*)    All other components  within normal limits  BRAIN NATRIURETIC PEPTIDE - Abnormal; Notable for the following components:   B Natriuretic Peptide 248.7 (*)    All other components within normal limits  TROPONIN I (HIGH SENSITIVITY) - Abnormal; Notable for the following components:   Troponin I (High Sensitivity) 49 (*)    All other components within normal limits    EKG None  Radiology DG Chest 2 View  Result Date: 08/09/2020 CLINICAL DATA:  History of CHF.  Worsening leg swelling. EXAM: CHEST - 2 VIEW COMPARISON:  06/05/2019. FINDINGS: Mediastinum and hilar structures normal. Slight progression of cardiomegaly. Mild pulmonary venous congestion. Mild basilar Kerley B-lines noted consistent with interstitial edema. No pleural effusion or pneumothorax. No acute bony abnormality. IMPRESSION: Slight progression of cardiomegaly. Mild pulmonary venous congestion. Mild basilar Kerley B-lines consistent with mild basilar interstitial edema. Findings consistent with mild CHF. Electronically Signed   By: Marcello Moores  Register   On: 08/09/2020 07:42    Procedures Procedures   Medications Ordered in ED Medications - No data to display  ED Course  I have reviewed the triage vital signs and the nursing notes.  Pertinent labs & imaging results that were available during my care of the patient were reviewed by me and considered in my medical decision making (see chart for details).    MDM Rules/Calculators/A&P                          Patient presents to the ED with chief complaint of leg swelling.  He has  been seen recently for the same.  He does have a history of CHF, and reports that he has some mild shortness of breath.  He is not hypoxic.  His BNP is better than it has been in the past.  Troponin is about baseline for him.  I doubt ACS.  Chest x-ray shows mild pulmonary vascular congestion.  Patient offered IV Lasix, but declines, requests oral.  Will give 40 mg oral Lasix and will discharge with prescription for 5 days with Lasix.  Recommend that he follow-up with his PCP and with cardiology. Final Clinical Impression(s) / ED Diagnoses Final diagnoses:  Leg swelling    Rx / DC Orders ED Discharge Orders         Ordered    furosemide (LASIX) 40 MG tablet  Daily        08/10/20 0559           Montine Circle, PA-C 08/10/20 4166    Veryl Speak, MD 08/10/20 [phone]

## 2020-08-11 ENCOUNTER — Other Ambulatory Visit: Payer: Self-pay | Admitting: Family Medicine

## 2020-08-11 ENCOUNTER — Other Ambulatory Visit: Payer: Self-pay

## 2020-08-11 ENCOUNTER — Encounter: Payer: Self-pay | Admitting: Family Medicine

## 2020-08-11 ENCOUNTER — Ambulatory Visit (INDEPENDENT_AMBULATORY_CARE_PROVIDER_SITE_OTHER): Payer: HRSA Program | Admitting: Family Medicine

## 2020-08-11 VITALS — BP 168/95 | HR 100 | Temp 97.9°F | Ht 67.0 in | Wt 159.0 lb

## 2020-08-11 DIAGNOSIS — E1142 Type 2 diabetes mellitus with diabetic polyneuropathy: Secondary | ICD-10-CM

## 2020-08-11 DIAGNOSIS — Z91199 Patient's noncompliance with other medical treatment and regimen due to unspecified reason: Secondary | ICD-10-CM

## 2020-08-11 DIAGNOSIS — Z09 Encounter for follow-up examination after completed treatment for conditions other than malignant neoplasm: Secondary | ICD-10-CM

## 2020-08-11 DIAGNOSIS — R739 Hyperglycemia, unspecified: Secondary | ICD-10-CM

## 2020-08-11 DIAGNOSIS — F419 Anxiety disorder, unspecified: Secondary | ICD-10-CM

## 2020-08-11 DIAGNOSIS — Z9119 Patient's noncompliance with other medical treatment and regimen: Secondary | ICD-10-CM

## 2020-08-11 DIAGNOSIS — IMO0002 Reserved for concepts with insufficient information to code with codable children: Secondary | ICD-10-CM

## 2020-08-11 DIAGNOSIS — E1165 Type 2 diabetes mellitus with hyperglycemia: Secondary | ICD-10-CM

## 2020-08-11 DIAGNOSIS — I10 Essential (primary) hypertension: Secondary | ICD-10-CM

## 2020-08-11 DIAGNOSIS — I160 Hypertensive urgency: Secondary | ICD-10-CM

## 2020-08-11 DIAGNOSIS — R7309 Other abnormal glucose: Secondary | ICD-10-CM

## 2020-08-11 LAB — POCT GLYCOSYLATED HEMOGLOBIN (HGB A1C)
HbA1c POC (<> result, manual entry): 10.9 % (ref 4.0–5.6)
HbA1c, POC (controlled diabetic range): 10.9 % — AB (ref 0.0–7.0)
HbA1c, POC (prediabetic range): 10.9 % — AB (ref 5.7–6.4)
Hemoglobin A1C: 10.9 % — AB (ref 4.0–5.6)

## 2020-08-11 MED ORDER — FREESTYLE LIBRE 14 DAY READER DEVI: 1.0000 | Freq: Three times a day (TID) | 11 refills | Status: DC

## 2020-08-11 MED ORDER — CLONIDINE HCL 0.2 MG PO TABS: 0.2000 mg | ORAL_TABLET | Freq: Once | ORAL | Status: DC

## 2020-08-11 MED ORDER — FREESTYLE LIBRE 14 DAY SENSOR MISC: 1.0000 | Freq: Three times a day (TID) | 11 refills | Status: DC

## 2020-08-11 NOTE — Progress Notes (Signed)
Patient [name]    Established Patient Office Visit  Subjective:  Patient ID: [name], male    DOB: 02/23/1971  Age: 50 y.o. MRN: [id number]  CC:  Chief Complaint  Patient presents with  . Follow-up    Follow up diabetes     HPI [name] is a 50 year old male who presents for Follow Up today.   Patient Active Problem List   Diagnosis Date Noted  . Lower extremity edema 03/07/2020  . CKD (chronic kidney disease) stage 3, GFR 30-59 ml/min (HCC) 02/22/2020  . Left ventricular dysfunction 04/27/2019  . Hemoglobin A1C greater than 9%, indicating poor diabetic control 07/29/2018  . New onset left bundle branch block (LBBB)   . 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 Hyperlipidemia LDL goal <70 11/24/2008  . Uncontrolled type 2 diabetes mellitus with peripheral neuropathy ([place]) 07/17/2006  . Essential hypertension 07/17/2006   Current Status: Since his last office visit, he is doing well with Anderson complaints. He states that he has not been compliant with taking his medications as prescribed. He denies visual changes, chest pain, cough, shortness of breath, heart palpitations, and falls. He has occasional headaches and dizziness with position changes. Denies severe headaches, confusion, seizures, double vision, and blurred vision, nausea and vomiting. He has not been monitoring his blood glucose levels lately. He denies fatigue, frequent urination, blurred vision, excessive hunger, excessive thirst, weight gain, weight loss, and poor wound healing. He continues to check his feet regularly. He denies fevers, chills, recent infections, weight loss, and night sweats. Denies GI problems such as diarrhea, and constipation. He has Anderson reports of blood in stools, dysuria and hematuria. Anderson depression or anxiety reported today. He is taking all medications as prescribed. He denies pain today.   Past Medical History:  Diagnosis  Date  . Anxiety 02/2019  . Anxiety 10/2019  . Diabetes mellitus type II, uncontrolled ([place]) 07/17/2006  . Elevated alkaline phosphatase level 01/2019  . ESSENTIAL HYPERTENSION 07/17/2006  . HYPERLIPIDEMIA 11/24/2008  . Insomnia 02/2019  . Left bundle branch block 02/22/2019  . Lesion of lip 10/2019  . Pneumonia 10/22/2011  . Suicidal ideations 02/2019  . Vitamin D deficiency 01/2019    Past Surgical History:  Procedure Laterality Date  . ARM SURGERY      Family History  Problem Relation Age of Onset  . Hypertension Mother 71  . Cancer Mother        bladder cancer  . Alcohol abuse Father   . Hypertension Brother     Social History   Socioeconomic History  . Marital status: Legally Separated    Spouse name: Not on file  . Number of children: Not on file  . Years of education: Not on file  . Highest education level: Not on file  Occupational History  . 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 Occupation: Biochemist, clinical    Comment: Updated June 2013  . Occupation: Engineer, civil (consulting)    Comment: Began Jan 2014  Tobacco Use  . Smoking status: Never Smoker  . Smokeless tobacco: Never Used  Vaping Use  . Vaping Use: Never used  Substance and Sexual Activity  . Alcohol use: Anderson    Alcohol/week: 0.0 standard drinks  . Drug use: Anderson  . Sexual activity: Yes    Partners: Female  Other Topics Concern  . Not on file  Social History Narrative   Lives with  his mother and brother.   Wife has had a foot amputation; is a type 1 DM.   Social Determinants of Health   Financial Resource Strain: Not on file  Food Insecurity: Not on file  Transportation Needs: Not on file  Physical Activity: Not on file  Stress: Not on file  Social Connections: Not on file  Intimate Partner Violence: Not on file    Outpatient Medications Prior to Visit  Medication Sig Dispense Refill  . amLODipine (NORVASC) 10 MG tablet Take 1 tablet (10 mg total) by mouth daily. 90 tablet 3  . carvedilol  (COREG) 25 MG tablet Take 1 tablet (25 mg total) by mouth 2 (two) times daily. 180 tablet 3  . furosemide (LASIX) 40 MG tablet Take 1 tablet (40 mg total) by mouth daily. Take for 4 days then stop 5 tablet 0  . atorvastatin (LIPITOR) 40 MG tablet Take 1 tablet (40 mg total) by mouth daily. (Patient not taking: Reported on 08/11/2020) 90 tablet 1  . Blood Glucose Monitoring Suppl (TRUE METRIX METER) w/Device KIT Use to measure blood sugar twice a day (Patient not taking: Reported on 08/11/2020) 1 kit 0  . glucose blood test strip Use to check fasting blood sugar once daily. 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 Insulin dependent (Patient not taking: Reported on 08/11/2020) 100 each 12  . hydrALAZINE (APRESOLINE) 25 MG tablet Take 1 tablet (25 mg total) by mouth 3 (three) times daily. (Patient not taking: Reported on 08/11/2020) 270 tablet 3  . insulin aspart (NOVOLOG FLEXPEN) 100 UNIT/ML FlexPen Inject 10 Units into the skin 2 (two) times daily with a meal. (Patient not taking: Reported on 08/11/2020) 15 mL 2  . insulin detemir (LEVEMIR FLEXTOUCH) 100 UNIT/ML FlexPen Inject 35 Units into the skin daily. (Patient not taking: Reported on 08/11/2020) 15 mL 11  . Insulin Pen Needle (PEN NEEDLES 29GX1/2") 29G X 12MM MISC Use with insulin pens (Patient not taking: Reported on 08/11/2020) 100 each 1  . potassium chloride SA (KLOR-CON M20) 20 MEQ tablet Take 1 tablet (20 mEq total) by mouth daily. Take x 4 days then stop (Patient not taking: Reported on 08/11/2020) 4 tablet 0  . sildenafil (VIAGRA) 25 MG tablet Take 1 tablet (25 mg total) by mouth as needed for erectile dysfunction. for erectile dysfunction. (Patient not taking: Reported on 08/11/2020) 30 tablet 3  . sitaGLIPtin (JANUVIA) 25 MG tablet Take 1 tablet (25 mg total) by mouth daily. (Patient not taking: Reported on 08/11/2020) 30 tablet 6  . TRUEplus Lancets 28G MISC Use to measure blood sugar twice a day (Patient not taking: Reported on 08/11/2020) 100 each 1  . Vitamin D,  Ergocalciferol, (DRISDOL) 1.25 MG (50000 UNIT) CAPS capsule Take 50,000 Units by mouth once a week. (Patient not taking: Reported on 08/11/2020)     Anderson facility-administered medications prior to visit.    Allergies  Allergen Reactions  . Sulfa Antibiotics Rash    Severe rash. Do not give sulfa medications  . Bactrim [Sulfamethoxazole-Trimethoprim]     Rash  . Cephalexin     Patient with severe drug reaction including exfoliating skin rash and hypotension after being prescribed both cephalexin and sulfamethoxazole-trimethoprim simultaneously. Favor SMX as much more likely culprit as patient had tolerated other cephalosporins in the past, but cannot say with complete certainty that this was not related to cephalexin.   . Other     Patient states he is allergic to the TB test    ROS Review of Systems  Constitutional:  Negative.   HENT: Negative.   Eyes: Negative.   Respiratory: Negative.   Cardiovascular: Negative.   Gastrointestinal: Negative.   Endocrine: Negative.   Genitourinary: Negative.   Musculoskeletal: Positive for arthralgias (generalized joint pain).  Skin: Negative.   Allergic/Immunologic: Negative.   Neurological: Negative.   Hematological: Negative.   Psychiatric/Behavioral: Negative.       Objective:    Physical Exam Vitals and nursing note reviewed.  Constitutional:      Appearance: Normal appearance.  HENT:     Head: Normocephalic.     Nose: Nose normal.     Mouth/Throat:     Mouth: Mucous membranes are moist.     Pharynx: Oropharynx is clear.  Cardiovascular:     Rate and Rhythm: Normal rate and regular rhythm.     Pulses: Normal pulses.     Heart sounds: Normal heart sounds.  Pulmonary:     Effort: Pulmonary effort is normal.     Breath sounds: Normal breath sounds.  Abdominal:     General: Bowel sounds are normal.     Palpations: Abdomen is soft.  Musculoskeletal:        General: Normal range of motion.     Cervical back: Normal range of  motion and neck supple.  Skin:    General: Skin is warm and dry.  Neurological:     General: Anderson focal deficit present.     Mental Status: He is alert and oriented to person, place, and time.  Psychiatric:        Mood and Affect: Mood normal.        Behavior: Behavior normal.        Thought Content: Thought content normal.        Judgment: Judgment normal.     BP (!) 168/95   Pulse 100   Temp 97.9 F (36.6 C) (Temporal)   Ht 5' 7"  (1.702 m)   Wt 159 lb (72.1 kg)   SpO2 98%   BMI 24.90 kg/m  Wt Readings from Last 3 Encounters:  08/11/20 159 lb (72.1 kg)  06/29/20 168 lb 3.2 oz (76.3 kg)  05/09/20 151 lb (68.5 kg)     Health Maintenance Due  Topic Date Due  . OPHTHALMOLOGY EXAM  Never done  . COLONOSCOPY (Pts 45-53yr Insurance coverage will need to be confirmed)  Never done  . COVID-19 Vaccine (3 - Booster for Pfizer series) 03/16/2020  . LIPID PANEL  07/26/2020    There are Anderson preventive Anderson reminders to display for this patient.  Lab Results  Component Value Date   TSH 2.470 01/22/2019   Lab Results  Component Value Date   WBC 5.9 08/10/2020   HGB 10.5 (L) 08/10/2020   HCT 32.4 (L) 08/10/2020   MCV 83.7 08/10/2020   PLT 326 08/10/2020   Lab Results  Component Value Date   NA 135 08/10/2020   K 3.5 08/10/2020   CO2 23 08/10/2020   GLUCOSE 419 (H) 08/10/2020   BUN 43 (H) 08/10/2020   CREATININE 3.27 (H) 08/10/2020   BILITOT 0.5 06/26/2020   ALKPHOS 99 06/26/2020   AST 23 06/26/2020   ALT 14 06/26/2020   PROT 5.4 (L) 06/26/2020   ALBUMIN 1.8 (L) 06/26/2020   CALCIUM 7.5 (L) 08/10/2020   ANIONGAP 6 08/10/2020   Lab Results  Component Value Date   CHOL 176 07/27/2019   Lab Results  Component Value Date   HDL 44 07/27/2019   Lab Results  Component Value Date  LDLCALC 114 (H) 07/27/2019   Lab Results  Component Value Date   TRIG 100 07/27/2019   Lab Results  Component Value Date   CHOLHDL 4.0 07/27/2019   Lab Results  Component  Value Date   HGBA1C 10.9 (A) 08/11/2020   HGBA1C 10.9 08/11/2020   HGBA1C 10.9 (A) 08/11/2020   HGBA1C 10.9 (A) 08/11/2020      Assessment & Plan:   1. Non compliance with medical treatment Patient states that he will begin taking all medications as prescribed.   2. Hypertensive urgency Blood pressures are elevated today. Clonidine 0.2 mg given to patient in office and blood pressures remain elevated. We referred him to ED via ambulance at this time. Patient refused and signed AMA form at discharge. He denies severe headaches, confusion, seizures, double vision, and blurred vision, nausea and vomiting. He will report to ED if he experiences these symptoms. Patient verbalized understanding.   - cloNIDine (CATAPRES) tablet 0.2 mg  3. Essential hypertension He will continue to take medications as prescribed, to decrease high sodium intake, excessive alcohol intake, increase potassium intake, smoking cessation, and increase physical activity of at least 30 minutes of cardio activity daily. He will continue to follow Heart Healthy or DASH diet. - cloNIDine (CATAPRES) tablet 0.2 mg  4. Uncontrolled type 2 diabetes mellitus with peripheral neuropathy (HCC) - POCT glycosylated hemoglobin (Hb A1C)  5. Hemoglobin A1C greater than 9%, indicating poor diabetic control Hgb A1c at 10.9 today, from 13.4 on 05/09/2020. Monitor.   6. Hyperglycemia  7. Anxiety  8. Follow up He will follow up in 3 months.   Meds ordered this encounter  Medications  . DISCONTD: Continuous Blood Gluc Receiver (FREESTYLE LIBRE 14 DAY READER) DEVI    Sig: 1 each by Does not apply route 4 (four) times daily -  before meals and at bedtime.    Dispense:  1 each    Refill:  11  . DISCONTD: Continuous Blood Gluc Sensor (FREESTYLE LIBRE 14 DAY SENSOR) MISC    Sig: 1 each by Does not apply route 4 (four) times daily -  before meals and at bedtime.    Dispense:  1 each    Refill:  11  . Continuous Blood Gluc Receiver  (FREESTYLE LIBRE 14 DAY READER) DEVI    Sig: 1 each by Does not apply route 4 (four) times daily -  before meals and at bedtime.    Dispense:  1 each    Refill:  11  . Continuous Blood Gluc Sensor (FREESTYLE LIBRE 14 DAY SENSOR) MISC    Sig: 1 each by Does not apply route 4 (four) times daily -  before meals and at bedtime.    Dispense:  1 each    Refill:  11  . cloNIDine (CATAPRES) tablet 0.2 mg    Orders Placed This Encounter  Procedures  . POCT glycosylated hemoglobin (Hb A1C)    Referral Orders  Anderson referral(s) requested today    Kathe Becton, MSN, ANE, FNP-BC [place], [place] [address] [phone]- fax   Problem List Items Addressed This Visit      Cardiovascular and Mediastinum   Essential hypertension   Relevant Medications   cloNIDine (CATAPRES) tablet 0.2 mg (Start on 08/11/2020  2:45 PM)     Endocrine   Hemoglobin A1C greater than 9%, indicating poor diabetic control   Uncontrolled type 2 diabetes mellitus with peripheral  neuropathy ([place])   Relevant Orders   POCT glycosylated hemoglobin (Hb A1C) (Completed)    Other Visit Diagnoses    Non compliance with medical treatment    -  Primary   Hypertensive urgency       Relevant Medications   cloNIDine (CATAPRES) tablet 0.2 mg (Start on 08/11/2020  2:45 PM)   Hyperglycemia       Anxiety       Follow up          Meds ordered this encounter  Medications  . DISCONTD: Continuous Blood Gluc Receiver (FREESTYLE LIBRE 14 DAY READER) DEVI    Sig: 1 each by Does not apply route 4 (four) times daily -  before meals and at bedtime.    Dispense:  1 each    Refill:  11  . DISCONTD: Continuous Blood Gluc Sensor (FREESTYLE LIBRE 14 DAY SENSOR) MISC    Sig: 1 each by Does not apply route 4 (four) times daily -  before meals and at bedtime.    Dispense:  1 each    Refill:  11  . Continuous  Blood Gluc Receiver (FREESTYLE LIBRE 14 DAY READER) DEVI    Sig: 1 each by Does not apply route 4 (four) times daily -  before meals and at bedtime.    Dispense:  1 each    Refill:  11  . Continuous Blood Gluc Sensor (FREESTYLE LIBRE 14 DAY SENSOR) MISC    Sig: 1 each by Does not apply route 4 (four) times daily -  before meals and at bedtime.    Dispense:  1 each    Refill:  11  . cloNIDine (CATAPRES) tablet 0.2 mg    Follow-up: Anderson follow-ups on file.    [name], FNP

## 2020-08-12 ENCOUNTER — Emergency Department (HOSPITAL_COMMUNITY)
Admission: EM | Admit: 2020-08-12 | Discharge: 2020-08-12 | Disposition: A | Discharge status: Home / Self Care | Payer: Self-pay | Attending: Emergency Medicine | Admitting: Emergency Medicine

## 2020-08-12 DIAGNOSIS — I10 Essential (primary) hypertension: Secondary | ICD-10-CM | POA: Insufficient documentation

## 2020-08-12 DIAGNOSIS — Z5321 Procedure and treatment not carried out due to patient leaving prior to being seen by health care provider: Secondary | ICD-10-CM | POA: Insufficient documentation

## 2020-08-12 NOTE — ED Notes (Signed)
This Probation officer saw the patient walking out. When stopped to ask if he was leaving patient states "yeah they know im gone." patient had not yet been triaged.

## 2020-08-12 NOTE — ED Notes (Signed)
Pt seen leaving ED without being triaged.

## 2020-08-15 ENCOUNTER — Encounter: Payer: Self-pay | Admitting: Family Medicine

## 2020-08-20 ENCOUNTER — Emergency Department (HOSPITAL_COMMUNITY)
Admission: EM | Admit: 2020-08-20 | Discharge: 2020-08-21 | Disposition: A | Discharge status: Home / Self Care | Payer: Self-pay | Attending: Emergency Medicine | Admitting: Emergency Medicine

## 2020-08-20 DIAGNOSIS — E1122 Type 2 diabetes mellitus with diabetic chronic kidney disease: Secondary | ICD-10-CM | POA: Insufficient documentation

## 2020-08-20 DIAGNOSIS — I130 Hypertensive heart and chronic kidney disease with heart failure and stage 1 through stage 4 chronic kidney disease, or unspecified chronic kidney disease: Secondary | ICD-10-CM | POA: Insufficient documentation

## 2020-08-20 DIAGNOSIS — N183 Chronic kidney disease, stage 3 unspecified: Secondary | ICD-10-CM | POA: Insufficient documentation

## 2020-08-20 DIAGNOSIS — I509 Heart failure, unspecified: Secondary | ICD-10-CM | POA: Insufficient documentation

## 2020-08-20 DIAGNOSIS — Z79899 Other long term (current) drug therapy: Secondary | ICD-10-CM | POA: Insufficient documentation

## 2020-08-20 DIAGNOSIS — Z794 Long term (current) use of insulin: Secondary | ICD-10-CM | POA: Insufficient documentation

## 2020-08-21 ENCOUNTER — Emergency Department (HOSPITAL_COMMUNITY): Payer: Self-pay

## 2020-08-21 ENCOUNTER — Other Ambulatory Visit: Payer: Self-pay

## 2020-08-21 LAB — BASIC METABOLIC PANEL
Anion gap: 8 (ref 5–15)
BUN: 39 mg/dL — ABNORMAL HIGH (ref 6–20)
CO2: 24 mmol/L (ref 22–32)
Calcium: 7.6 mg/dL — ABNORMAL LOW (ref 8.9–10.3)
Chloride: 103 mmol/L (ref 98–111)
Creatinine, Ser: 3.6 mg/dL — ABNORMAL HIGH (ref 0.61–1.24)
GFR, Estimated: 20 mL/min — ABNORMAL LOW (ref >60)
Glucose, Bld: 404 mg/dL — ABNORMAL HIGH (ref 70–99)
Potassium: 3.6 mmol/L (ref 3.5–5.1)
Sodium: 135 mmol/L (ref 135–145)

## 2020-08-21 LAB — CBC WITH DIFFERENTIAL/PLATELET
Abs Immature Granulocytes: 0.02 10*3/uL (ref 0.00–0.07)
Basophils Absolute: 0 10*3/uL (ref 0.0–0.1)
Basophils Relative: 0 %
Eosinophils Absolute: 0.2 10*3/uL (ref 0.0–0.5)
Eosinophils Relative: 3 %
HCT: 33.5 % — ABNORMAL LOW (ref 39.0–52.0)
Hemoglobin: 10.8 g/dL — ABNORMAL LOW (ref 13.0–17.0)
Immature Granulocytes: 0 %
Lymphocytes Relative: 24 %
Lymphs Abs: 1.6 10*3/uL (ref 0.7–4.0)
MCH: 27.3 pg (ref 26.0–34.0)
MCHC: 32.2 g/dL (ref 30.0–36.0)
MCV: 84.6 fL (ref 80.0–100.0)
Monocytes Absolute: 0.5 10*3/uL (ref 0.1–1.0)
Monocytes Relative: 8 %
Neutro Abs: 4.2 10*3/uL (ref 1.7–7.7)
Neutrophils Relative %: 65 %
Platelets: 332 10*3/uL (ref 150–400)
RBC: 3.96 MIL/uL — ABNORMAL LOW (ref 4.22–5.81)
RDW: 13 % (ref 11.5–15.5)
WBC: 6.5 10*3/uL (ref 4.0–10.5)
nRBC: 0 % (ref 0.0–0.2)

## 2020-08-21 LAB — TROPONIN I (HIGH SENSITIVITY)
Troponin I (High Sensitivity): 86 ng/L — ABNORMAL HIGH (ref <18)
Troponin I (High Sensitivity): 78 ng/L — ABNORMAL HIGH (ref <18)

## 2020-08-21 LAB — BRAIN NATRIURETIC PEPTIDE: B Natriuretic Peptide: 453.5 pg/mL — ABNORMAL HIGH (ref 0.0–100.0)

## 2020-08-21 MED ORDER — FUROSEMIDE 10 MG/ML IJ SOLN
80.0000 mg | Freq: Once | INTRAMUSCULAR | Status: AC
  Administered 2020-08-21: 80 mg via INTRAVENOUS
  Filled 2020-08-21: qty 8

## 2020-08-21 NOTE — ED Notes (Addendum)
Unit secretary ordered compression stockings

## 2020-08-21 NOTE — Discharge Instructions (Addendum)
Continue to take your medications as prescribed.  Make sure you are taking your blood pressure medicines as well as your fluid pill.  Follow-up with your cardiologist as soon as possible.

## 2020-08-21 NOTE — ED Notes (Signed)
Patient provided with compression stockings.

## 2020-08-21 NOTE — ED Notes (Signed)
This nurse was told from tech that Pt refused hIs EKG and was advised again to get all labs and care done, but pt refused he said I don't want it.

## 2020-08-21 NOTE — ED Triage Notes (Signed)
Pt said bilateral leg swelling has been going on for some time now and is on a fluid pill and not working per patient.

## 2020-08-21 NOTE — ED Notes (Signed)
Patient called to triage for EKG. Patient stated "I don't want it". Staff reiterated that an EKG was ordered and that the patient is encouraged to have it completed. Patient continued to decline EKG. Triage RN made aware.

## 2020-08-21 NOTE — ED Notes (Signed)
Patient removed all monitoring equipment. 

## 2020-08-21 NOTE — ED Provider Notes (Signed)
[place] EMERGENCY DEPARTMENT Provider Note   CSN: [id number] Arrival date & time: 08/20/20  2259     History Chief Complaint  Patient presents with  . Leg Swelling    [name] is a 50 y.o. male.  Patient presents to the emergency department for evaluation of lower extremity edema.  Patient has been seen several times in this ER with similar complaints.  He reports that he is taking his Lasix as prescribed.  He is not experiencing chest pain.  He denies shortness of breath.        Past Medical History:  Diagnosis Date  . Anxiety 02/2019  . Anxiety 10/2019  . Diabetes mellitus type II, uncontrolled ([place]) 07/17/2006  . Elevated alkaline phosphatase level 01/2019  . ESSENTIAL HYPERTENSION 07/17/2006  . HYPERLIPIDEMIA 11/24/2008  . Insomnia 02/2019  . Left bundle branch block 02/22/2019  . Lesion of lip 10/2019  . Pneumonia 10/22/2011  . Suicidal ideations 02/2019  . Vitamin D deficiency 01/2019    Patient Active Problem List   Diagnosis Date Noted  . Lower extremity edema 03/07/2020  . CKD (chronic kidney disease) stage 3, GFR 30-59 ml/min (HCC) 02/22/2020  . Left ventricular dysfunction 04/27/2019  . Hemoglobin A1C greater than 9%, indicating poor diabetic control 07/29/2018  . New onset left bundle branch block (LBBB)   . 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 Hyperlipidemia LDL goal <70 11/24/2008  . Uncontrolled type 2 diabetes mellitus with peripheral neuropathy ([place]) 07/17/2006  . Essential hypertension 07/17/2006    Past Surgical History:  Procedure Laterality Date  . ARM SURGERY         Family History  Problem Relation Age of Onset  . Hypertension Mother 43  . Cancer Mother        bladder cancer  . Alcohol abuse Father   . Hypertension Brother     Social History   Tobacco Use  . Smoking status: Never Smoker  . Smokeless tobacco: Never Used  Vaping Use  . Vaping Use: Never used  Substance Use Topics  .  Alcohol use: No    Alcohol/week: 0.0 standard drinks  . Drug use: No    Home Medications Prior to Admission medications   Medication Sig Start Date End Date Taking? Authorizing Provider  amLODipine (NORVASC) 10 MG tablet Take 1 tablet (10 mg total) by mouth daily. 02/22/20 02/11/22  [name], MD  atorvastatin (LIPITOR) 40 MG tablet TAKE 1 TABLET (40 MG TOTAL) BY MOUTH DAILY. Patient not taking: Reported on 08/11/2020 02/23/20 02/22/21  [name], MD  Blood Glucose Monitoring Suppl (TRUE METRIX GO GLUCOSE METER) w/Device KIT USE TO MEASURE BLOOD SUGAR TWICE A DAY 02/22/20 02/21/21  [name], MD  Blood Glucose Monitoring Suppl (TRUE METRIX METER) w/Device KIT Use to measure blood sugar twice a day Patient not taking: Reported on 08/11/2020 02/22/20   [name], MD  carvedilol (COREG) 25 MG tablet TAKE 1 TABLET (25 MG TOTAL) BY MOUTH 2 (TWO) TIMES DAILY. 03/07/20 03/07/21  Lorretta Harp, MD  Continuous Blood Gluc Receiver (FREESTYLE LIBRE 14 DAY READER) DEVI 1 EACH BY DOES NOT APPLY ROUTE 4 (FOUR) TIMES DAILY - BEFORE MEALS AND AT BEDTIME. 08/11/20 08/11/21  [name], FNP  Continuous Blood Gluc Sensor (FREESTYLE LIBRE 14 DAY SENSOR) MISC 1 each by Does not apply route 4 (four) times daily -  before meals and at bedtime. 08/11/20   [name], FNP  furosemide (LASIX) 40 MG tablet Take 1 tablet (40 mg total) by mouth daily. Take for 4 days then stop 08/10/20   Montine Circle, PA-C  furosemide (LASIX) 40 MG tablet TAKE 1 TABLET BY MOUTH DAILY AND AS DIRECTED 07/03/20 07/03/21  Claudia Desanctis, MD  glucose blood test strip Use to check fasting blood sugar once daily. 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 Insulin dependent Patient not taking: Reported on 08/11/2020 02/22/20   [name], MD  hydrALAZINE (APRESOLINE) 25 MG tablet TAKE 1 TABLET (25 MG TOTAL) BY MOUTH 3 (THREE) TIMES DAILY. Patient not taking: Reported on 08/11/2020 03/07/20 03/07/21  Lorretta Harp, MD   insulin aspart (NOVOLOG FLEXPEN) 100 UNIT/ML FlexPen Inject 10 Units into the skin 2 (two) times daily with a meal. Patient not taking: Reported on 08/11/2020 02/23/20   [name], MD  insulin detemir (LEVEMIR FLEXTOUCH) 100 UNIT/ML FlexPen Inject 35 Units into the skin daily. Patient not taking: Reported on 08/11/2020 02/22/20   [name], MD  Insulin Pen Needle (PEN NEEDLES 29GX1/2") 29G X 12MM MISC Use with insulin pens Patient not taking: Reported on 08/11/2020 02/22/20   [name], MD  potassium chloride SA (KLOR-CON) 20 MEQ tablet TAKE 1 TABLET (20 MEQ TOTAL) BY MOUTH DAILY. TAKE FOR 4 DAYS THEN STOP Patient not taking: Reported on 08/11/2020 06/29/20 06/29/21  Deberah Pelton, NP  sildenafil (VIAGRA) 25 MG tablet Take 1 tablet (25 mg total) by mouth as needed for erectile dysfunction. for erectile dysfunction. Patient not taking: Reported on 08/11/2020 10/13/19   [name], FNP  sitaGLIPtin (JANUVIA) 25 MG tablet Take 1 tablet (25 mg total) by mouth daily. Patient not taking: Reported on 08/11/2020 02/22/20   [name], MD  TRUEplus Lancets 28G MISC USE TO MEASURE BLOOD SUGAR TWICE A DAY Patient not taking: Reported on 08/11/2020 02/22/20 02/21/21  [name], MD  Vitamin D, Ergocalciferol, (DRISDOL) 1.25 MG (50000 UNIT) CAPS capsule Take 50,000 Units by mouth once a week. Patient not taking: Reported on 08/11/2020 01/26/20   [provider]  Vitamin D, Ergocalciferol, (DRISDOL) 1.25 MG (50000 UNIT) CAPS capsule TAKE 1 CAPSULE BY MOUTH ONCE A WEEK 07/07/20 07/07/21  Claudia Desanctis, MD  gabapentin (NEURONTIN) 300 MG capsule Take 1 capsule (300 mg total) by mouth 3 (three) times daily. Patient not taking: Reported on 07/27/2019 07/29/18 08/30/19  [name], FNP    Allergies    Sulfa antibiotics, Bactrim [sulfamethoxazole-trimethoprim], Cephalexin, and Other  Review of Systems   Review of Systems  Cardiovascular: Positive for leg swelling.  All  other systems reviewed and are negative.   Physical Exam Updated Vital Signs BP (!) 164/150   Pulse 89   Temp 98.6 F (37 C) (Oral)   Resp 17   SpO2 95%   Physical Exam Vitals and nursing note reviewed.  Constitutional:      General: He is not in acute distress.    Appearance: Normal appearance. He is well-developed.  HENT:     Head: Normocephalic and atraumatic.     Right Ear: Hearing normal.     Left Ear: Hearing normal.     Nose: Nose normal.  Eyes:     Conjunctiva/sclera: Conjunctivae normal.     Pupils: Pupils are equal, round, and reactive to light.  Cardiovascular:     Rate and Rhythm: Regular rhythm.     Heart sounds: S1 normal and S2 normal. No murmur heard. No friction rub. No gallop.  Pulmonary:     Effort: Pulmonary effort is normal. No respiratory distress.     Breath sounds: Normal breath sounds.  Chest:     Chest wall: No tenderness.  Abdominal:     General: Bowel sounds are normal.     Palpations: Abdomen is soft.     Tenderness: There is no abdominal tenderness. There is no guarding or rebound. Negative signs include Murphy's sign and McBurney's sign.     Hernia: No hernia is present.  Musculoskeletal:        General: Normal range of motion.     Cervical back: Normal range of motion and neck supple.     Right lower leg: Edema present.     Left lower leg: Edema present.  Skin:    General: Skin is warm and dry.     Findings: No rash.  Neurological:     Mental Status: He is alert and oriented to person, place, and time.     GCS: GCS eye subscore is 4. GCS verbal subscore is 5. GCS motor subscore is 6.     Cranial Nerves: No cranial nerve deficit.     Sensory: No sensory deficit.     Coordination: Coordination normal.  Psychiatric:        Speech: Speech normal.        Behavior: Behavior normal.        Thought Content: Thought content normal.     ED Results / Procedures / Treatments   Labs (all labs ordered are listed, but only abnormal  results are displayed) Labs Reviewed  CBC WITH DIFFERENTIAL/PLATELET - Abnormal; Notable for the following components:      Result Value   RBC 3.96 (*)    Hemoglobin 10.8 (*)    HCT 33.5 (*)    All other components within normal limits  BASIC METABOLIC PANEL - Abnormal; Notable for the following components:   Glucose, Bld 404 (*)    BUN 39 (*)    Creatinine, Ser 3.60 (*)    Calcium 7.6 (*)    GFR, Estimated 20 (*)    All other components within normal limits  BRAIN NATRIURETIC PEPTIDE - Abnormal; Notable for the following components:   B Natriuretic Peptide 453.5 (*)    All other components within normal limits  TROPONIN I (HIGH SENSITIVITY) - Abnormal; Notable for the following components:   Troponin I (High Sensitivity) 86 (*)    All other components within normal limits  TROPONIN I (HIGH SENSITIVITY) - Abnormal; Notable for the following components:   Troponin I (High Sensitivity) 78 (*)    All other components within normal limits    EKG None  Radiology DG Chest 2 View  Result Date: 08/21/2020 CLINICAL DATA:  Leg swelling and fluid overload. History of diabetes, hypertension. EXAM: CHEST - 2 VIEW COMPARISON:  08/10/2020 FINDINGS: Cardiac enlargement. No vascular congestion, edema, or consolidation. No pleural effusions. No pneumothorax. Mediastinal contours appear intact. IMPRESSION: Cardiac enlargement. Vascular congestive changes have improved since prior study. Electronically Signed   By: Lucienne Capers M.D.   On: 08/21/2020 00:50    Procedures Procedures   Medications Ordered in ED Medications  furosemide (LASIX) injection 80 mg (80 mg Intravenous Given 08/21/20 0403)    ED Course  I have reviewed the triage vital signs and the nursing notes.  Pertinent labs & imaging results that were available during my care of the patient were reviewed by me and considered in my medical decision making (see chart for  details).    MDM Rules/Calculators/A&P                           Patient presents with persistent leg swelling.  He is not short of breath or experiencing chest pain.  Troponin is elevated.  He has chronically elevated troponins.  Troponin today slightly more elevated than in the past but his second troponin is actually somewhat lower.  This is felt to be secondary to his chronic hypertension and congestive heart failure.  Chest x-ray does not show any obvious pulmonary edema.  Patient does not wish to be admitted to the hospital.  Initially he did not want IV Lasix but as he is taking oral already, I was able to convince him to get a dose of IV Lasix to see if that would help pull some of the fluid out of him.  He had compression stockings placed.  Final Clinical Impression(s) / ED Diagnoses Final diagnoses:  Acute on chronic congestive heart failure, unspecified heart failure type [facility])    Rx / DC Orders ED Discharge Orders    None       Orpah Greek, MD 08/21/20 [id number]

## 2020-08-23 ENCOUNTER — Emergency Department (HOSPITAL_COMMUNITY): Payer: Self-pay

## 2020-08-23 ENCOUNTER — Other Ambulatory Visit: Payer: Self-pay

## 2020-08-23 ENCOUNTER — Emergency Department (HOSPITAL_COMMUNITY)
Admission: EM | Admit: 2020-08-23 | Discharge: 2020-08-23 | Disposition: A | Discharge status: Home / Self Care | Payer: Self-pay | Attending: Emergency Medicine | Admitting: Emergency Medicine

## 2020-08-23 ENCOUNTER — Encounter (HOSPITAL_COMMUNITY): Payer: Self-pay

## 2020-08-23 DIAGNOSIS — E1140 Type 2 diabetes mellitus with diabetic neuropathy, unspecified: Secondary | ICD-10-CM | POA: Insufficient documentation

## 2020-08-23 DIAGNOSIS — N179 Acute kidney failure, unspecified: Secondary | ICD-10-CM | POA: Insufficient documentation

## 2020-08-23 DIAGNOSIS — Z79899 Other long term (current) drug therapy: Secondary | ICD-10-CM | POA: Insufficient documentation

## 2020-08-23 DIAGNOSIS — Z794 Long term (current) use of insulin: Secondary | ICD-10-CM | POA: Insufficient documentation

## 2020-08-23 DIAGNOSIS — R609 Edema, unspecified: Secondary | ICD-10-CM | POA: Insufficient documentation

## 2020-08-23 DIAGNOSIS — R600 Localized edema: Secondary | ICD-10-CM

## 2020-08-23 DIAGNOSIS — N183 Chronic kidney disease, stage 3 unspecified: Secondary | ICD-10-CM | POA: Insufficient documentation

## 2020-08-23 DIAGNOSIS — N189 Chronic kidney disease, unspecified: Secondary | ICD-10-CM

## 2020-08-23 DIAGNOSIS — I129 Hypertensive chronic kidney disease with stage 1 through stage 4 chronic kidney disease, or unspecified chronic kidney disease: Secondary | ICD-10-CM | POA: Insufficient documentation

## 2020-08-23 DIAGNOSIS — E1122 Type 2 diabetes mellitus with diabetic chronic kidney disease: Secondary | ICD-10-CM | POA: Insufficient documentation

## 2020-08-23 LAB — CBC WITH DIFFERENTIAL/PLATELET
Abs Immature Granulocytes: 0.01 10*3/uL (ref 0.00–0.07)
Basophils Absolute: 0 10*3/uL (ref 0.0–0.1)
Basophils Relative: 1 %
Eosinophils Absolute: 0.2 10*3/uL (ref 0.0–0.5)
Eosinophils Relative: 3 %
HCT: 33.5 % — ABNORMAL LOW (ref 39.0–52.0)
Hemoglobin: 10.8 g/dL — ABNORMAL LOW (ref 13.0–17.0)
Immature Granulocytes: 0 %
Lymphocytes Relative: 28 %
Lymphs Abs: 1.8 10*3/uL (ref 0.7–4.0)
MCH: 27.1 pg (ref 26.0–34.0)
MCHC: 32.2 g/dL (ref 30.0–36.0)
MCV: 84 fL (ref 80.0–100.0)
Monocytes Absolute: 0.6 10*3/uL (ref 0.1–1.0)
Monocytes Relative: 9 %
Neutro Abs: 3.9 10*3/uL (ref 1.7–7.7)
Neutrophils Relative %: 59 %
Platelets: 334 10*3/uL (ref 150–400)
RBC: 3.99 MIL/uL — ABNORMAL LOW (ref 4.22–5.81)
RDW: 13 % (ref 11.5–15.5)
WBC: 6.6 10*3/uL (ref 4.0–10.5)
nRBC: 0 % (ref 0.0–0.2)

## 2020-08-23 LAB — BASIC METABOLIC PANEL
Anion gap: 6 (ref 5–15)
BUN: 52 mg/dL — ABNORMAL HIGH (ref 6–20)
CO2: 24 mmol/L (ref 22–32)
Calcium: 7.6 mg/dL — ABNORMAL LOW (ref 8.9–10.3)
Chloride: 107 mmol/L (ref 98–111)
Creatinine, Ser: 3.91 mg/dL — ABNORMAL HIGH (ref 0.61–1.24)
GFR, Estimated: 18 mL/min — ABNORMAL LOW (ref >60)
Glucose, Bld: 316 mg/dL — ABNORMAL HIGH (ref 70–99)
Potassium: 3.3 mmol/L — ABNORMAL LOW (ref 3.5–5.1)
Sodium: 137 mmol/L (ref 135–145)

## 2020-08-23 LAB — BRAIN NATRIURETIC PEPTIDE: B Natriuretic Peptide: 192.4 pg/mL — ABNORMAL HIGH (ref 0.0–100.0)

## 2020-08-23 LAB — TROPONIN I (HIGH SENSITIVITY)
Troponin I (High Sensitivity): 59 ng/L — ABNORMAL HIGH (ref <18)
Troponin I (High Sensitivity): 63 ng/L — ABNORMAL HIGH (ref <18)

## 2020-08-23 MED ORDER — FUROSEMIDE 10 MG/ML IJ SOLN
40.0000 mg | Freq: Once | INTRAMUSCULAR | Status: AC
  Administered 2020-08-23: 40 mg via INTRAVENOUS
  Filled 2020-08-23: qty 4

## 2020-08-23 NOTE — ED Notes (Signed)
Pt eating peanut butter crackers and drinking a mountain dew

## 2020-08-23 NOTE — Discharge Instructions (Addendum)
You were seen today for ongoing leg swelling.  It is important that you take your blood pressure medication and your diuretic.  Follow-up closely with your nephrologist and your cardiologist for recheck of your kidney function.  If you develop worsening shortness of breath, chest pain, any new or worsening symptoms you should be reevaluated.

## 2020-08-23 NOTE — ED Triage Notes (Addendum)
Pt reports HTN for over a month. Pt unsure of what blood pressure is. Pt sts bilateral feet swelling. Took fluid pill but is not helping. Sts he is tired of feeling dizzy.

## 2020-08-23 NOTE — ED Provider Notes (Signed)
[place] [facility] DEPT Provider Note   CSN: [id number] Arrival date & time: 08/23/20  0019     History Chief Complaint  Patient presents with  . Foot Swelling    [name] is a 50 y.o. male.  HPI     This 50 year old male with history of diabetes, chronic hypertension, hyperlipidemia who presents with lower extremity swelling.  Has been seen and evaluated on 4/4 for the same.  At that time recommended that he increase his Lasix.  He states he has been taking 80 mg of Lasix daily.  He is unsure whether he has had increased urine output.  He reports persistent bilateral foot swelling.  He also reports occasional shortness of breath although he is not having any shortness of breath right now.  Denies any chest pain.  He states he was told to be reevaluated if his symptoms did not improve.  He has not had any fever cough.  No abdominal pain.  Past Medical History:  Diagnosis Date  . Anxiety 02/2019  . Anxiety 10/2019  . Diabetes mellitus type II, uncontrolled ([facility]) 07/17/2006  . Elevated alkaline phosphatase level 01/2019  . ESSENTIAL HYPERTENSION 07/17/2006  . HYPERLIPIDEMIA 11/24/2008  . Insomnia 02/2019  . Left bundle branch block 02/22/2019  . Lesion of lip 10/2019  . Pneumonia 10/22/2011  . Suicidal ideations 02/2019  . Vitamin D deficiency 01/2019    Patient Active Problem List   Diagnosis Date Noted  . Lower extremity edema 03/07/2020  . CKD (chronic kidney disease) stage 3, GFR 30-59 ml/min ([facility]) 02/22/2020  . Left ventricular dysfunction 04/27/2019  . Hemoglobin A1C greater than 9%, indicating poor diabetic control 07/29/2018  . New onset left bundle branch block (LBBB)   . 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 Hyperlipidemia LDL goal <70 11/24/2008  . Uncontrolled type 2 diabetes mellitus with peripheral neuropathy ([place]) 07/17/2006  . Essential hypertension 07/17/2006    Past Surgical History:  Procedure Laterality Date   . ARM SURGERY         Family History  Problem Relation Age of Onset  . Hypertension Mother 65  . Cancer Mother        bladder cancer  . Alcohol abuse Father   . Hypertension Brother     Social History   Tobacco Use  . Smoking status: Never Smoker  . Smokeless tobacco: Never Used  Vaping Use  . Vaping Use: Never used  Substance Use Topics  . Alcohol use: No    Alcohol/week: 0.0 standard drinks  . Drug use: No    Home Medications Prior to Admission medications   Medication Sig Start Date End Date Taking? Authorizing Provider  amLODipine (NORVASC) 10 MG tablet Take 1 tablet (10 mg total) by mouth daily. 02/22/20 02/11/22  [name], MD  atorvastatin (LIPITOR) 40 MG tablet TAKE 1 TABLET (40 MG TOTAL) BY MOUTH DAILY. Patient not taking: Reported on 08/11/2020 02/23/20 02/22/21  [name], MD  Blood Glucose Monitoring Suppl (TRUE METRIX GO GLUCOSE METER) w/Device KIT USE TO MEASURE BLOOD SUGAR TWICE A DAY 02/22/20 02/21/21  [name], MD  Blood Glucose Monitoring Suppl (TRUE METRIX METER) w/Device KIT Use to measure blood sugar twice a day Patient not taking: Reported on 08/11/2020 02/22/20   [name], MD  carvedilol (COREG) 25 MG tablet TAKE 1 TABLET (25 MG TOTAL) BY MOUTH 2 (TWO) TIMES DAILY. 03/07/20 03/07/21  Lorretta Harp, MD  Continuous Blood Gluc Receiver (  FREESTYLE LIBRE 14 DAY READER) DEVI 1 EACH BY DOES NOT APPLY ROUTE 4 (FOUR) TIMES DAILY - BEFORE MEALS AND AT BEDTIME. 08/11/20 08/11/21  [name], FNP  Continuous Blood Gluc Sensor (FREESTYLE LIBRE 14 DAY SENSOR) MISC 1 each by Does not apply route 4 (four) times daily -  before meals and at bedtime. 08/11/20   [name], FNP  furosemide (LASIX) 40 MG tablet Take 1 tablet (40 mg total) by mouth daily. Take for 4 days then stop 08/10/20   Montine Circle, PA-C  furosemide (LASIX) 40 MG tablet TAKE 1 TABLET BY MOUTH DAILY AND AS DIRECTED 07/03/20 07/03/21  Claudia Desanctis, MD   glucose blood test strip Use to check fasting blood sugar once daily. 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 Insulin dependent Patient not taking: Reported on 08/11/2020 02/22/20   [name], MD  hydrALAZINE (APRESOLINE) 25 MG tablet TAKE 1 TABLET (25 MG TOTAL) BY MOUTH 3 (THREE) TIMES DAILY. Patient not taking: Reported on 08/11/2020 03/07/20 03/07/21  Lorretta Harp, MD  insulin aspart (NOVOLOG FLEXPEN) 100 UNIT/ML FlexPen Inject 10 Units into the skin 2 (two) times daily with a meal. Patient not taking: Reported on 08/11/2020 02/23/20   [name], MD  insulin detemir (LEVEMIR FLEXTOUCH) 100 UNIT/ML FlexPen Inject 35 Units into the skin daily. Patient not taking: Reported on 08/11/2020 02/22/20   [name], MD  Insulin Pen Needle (PEN NEEDLES 29GX1/2") 29G X 12MM MISC Use with insulin pens Patient not taking: Reported on 08/11/2020 02/22/20   [name], MD  potassium chloride SA (KLOR-CON) 20 MEQ tablet TAKE 1 TABLET (20 MEQ TOTAL) BY MOUTH DAILY. TAKE FOR 4 DAYS THEN STOP Patient not taking: Reported on 08/11/2020 06/29/20 06/29/21  Deberah Pelton, NP  sildenafil (VIAGRA) 25 MG tablet Take 1 tablet (25 mg total) by mouth as needed for erectile dysfunction. for erectile dysfunction. Patient not taking: Reported on 08/11/2020 10/13/19   [name], FNP  sitaGLIPtin (JANUVIA) 25 MG tablet Take 1 tablet (25 mg total) by mouth daily. Patient not taking: Reported on 08/11/2020 02/22/20   [name], MD  TRUEplus Lancets 28G MISC USE TO MEASURE BLOOD SUGAR TWICE A DAY Patient not taking: Reported on 08/11/2020 02/22/20 02/21/21  [name], MD  Vitamin D, Ergocalciferol, (DRISDOL) 1.25 MG (50000 UNIT) CAPS capsule Take 50,000 Units by mouth once a week. Patient not taking: Reported on 08/11/2020 01/26/20   [provider]  Vitamin D, Ergocalciferol, (DRISDOL) 1.25 MG (50000 UNIT) CAPS capsule TAKE 1 CAPSULE BY MOUTH ONCE A WEEK 07/07/20 07/07/21  Claudia Desanctis, MD   gabapentin (NEURONTIN) 300 MG capsule Take 1 capsule (300 mg total) by mouth 3 (three) times daily. Patient not taking: Reported on 07/27/2019 07/29/18 08/30/19  [name], FNP    Allergies    Sulfa antibiotics, Bactrim [sulfamethoxazole-trimethoprim], Cephalexin, and Other  Review of Systems   Review of Systems  Constitutional: Negative for fever.  Respiratory: Positive for shortness of breath.   Cardiovascular: Positive for leg swelling. Negative for chest pain.  Gastrointestinal: Negative for abdominal pain, nausea and vomiting.  Genitourinary: Negative for dysuria.  All other systems reviewed and are negative.   Physical Exam Updated Vital Signs BP (!) 169/96   Pulse 92   Temp 98.3 F (36.8 C)   Resp 16   Ht 1.702 m (5' 7" )   Wt 66.7 kg   SpO2 98%   BMI 23.02 kg/m   Physical Exam  Vitals and nursing note reviewed.  Constitutional:      Appearance: He is well-developed. He is not ill-appearing.  HENT:     Head: Normocephalic and atraumatic.     Nose: Nose normal.     Mouth/Throat:     Mouth: Mucous membranes are moist.  Eyes:     Pupils: Pupils are equal, round, and reactive to light.  Cardiovascular:     Rate and Rhythm: Normal rate and regular rhythm.     Heart sounds: Normal heart sounds. No murmur heard.   Pulmonary:     Effort: Pulmonary effort is normal. No respiratory distress.     Breath sounds: Normal breath sounds. No wheezing.  Abdominal:     General: Bowel sounds are normal.     Palpations: Abdomen is soft.     Tenderness: There is no abdominal tenderness. There is no rebound.  Musculoskeletal:     Cervical back: Neck supple.     Comments: 2+ pitting bilateral lower extremity edema to the ankles  Lymphadenopathy:     Cervical: No cervical adenopathy.  Skin:    General: Skin is warm and dry.  Neurological:     Mental Status: He is alert and oriented to person, place, and time.  Psychiatric:        Mood and Affect: Mood normal.      ED Results / Procedures / Treatments   Labs (all labs ordered are listed, but only abnormal results are displayed) Labs Reviewed  CBC WITH DIFFERENTIAL/PLATELET - Abnormal; Notable for the following components:      Result Value   RBC 3.99 (*)    Hemoglobin 10.8 (*)    HCT 33.5 (*)    All other components within normal limits  BASIC METABOLIC PANEL - Abnormal; Notable for the following components:   Potassium 3.3 (*)    Glucose, Bld 316 (*)    BUN 52 (*)    Creatinine, Ser 3.91 (*)    Calcium 7.6 (*)    GFR, Estimated 18 (*)    All other components within normal limits  BRAIN NATRIURETIC PEPTIDE - Abnormal; Notable for the following components:   B Natriuretic Peptide 192.4 (*)    All other components within normal limits  TROPONIN I (HIGH SENSITIVITY) - Abnormal; Notable for the following components:   Troponin I (High Sensitivity) 59 (*)    All other components within normal limits  TROPONIN I (HIGH SENSITIVITY)    EKG None  Radiology DG Chest 2 View  Result Date: 08/23/2020 CLINICAL DATA:  Shortness of breath and dizziness with extremities swelling. EXAM: CHEST - 2 VIEW COMPARISON:  08/21/2020 FINDINGS: Mild cardiac enlargement. No vascular congestion, edema, or consolidation. No pleural effusions. No pneumothorax. Mediastinal contours appear intact. IMPRESSION: Cardiac enlargement.  No evidence of active pulmonary disease. Electronically Signed   By: Lucienne Capers M.D.   On: 08/23/2020 02:13    Procedures .Critical Care Performed by: Merryl Hacker, MD Authorized by: Merryl Hacker, MD   Critical care provider statement:    Critical care time (minutes):  31   Critical care was necessary to treat or prevent imminent or life-threatening deterioration of the following conditions:  Cardiac failure   Critical care was time spent personally by me on the following activities:  Discussions with consultants, evaluation of patient's response to treatment,  examination of patient, ordering and performing treatments and interventions, ordering and review of laboratory studies, ordering and review of radiographic studies, pulse oximetry, re-evaluation of patient's  condition, obtaining history from patient or surrogate and review of old charts     Medications Ordered in ED Medications  furosemide (LASIX) injection 40 mg (40 mg Intravenous Given 08/23/20 0322)    ED Course  I have reviewed the triage vital signs and the nursing notes.  Pertinent labs & imaging results that were available during my care of the patient were reviewed by me and considered in my medical decision making (see chart for details).  Clinical Course as of 08/23/20 0405  Wed Aug 23, 2020  0309 I spoke to the patient regarding his lab work-up and imaging.  Essentially, the majority of his work-up is stable.  He has no pleural effusions.  He is not hypoxic.  BNP has actually improved to 192.  He does have a steadily increasing creatinine.  Baseline 3.2-3.4.  It is 3.9 today.  This may be related to increased diuretic use.  He also remains fairly uncontrolled with his high blood pressure.  We discussed possibilities for ongoing treatment.  He does see cardiology, nephrology, and primary physician.  He would like to go home if at all possible.  Do not feel this is unreasonable as he is not unstable.  I do feel he needs close monitoring given his slightly increased creatinine and increased dose of Lasix.  He was given 1 additional dose of IV Lasix in the emergency room.  Recommended close follow-up with cardiology and nephrology for ongoing recommendations for diuresis and monitoring of kidney function.  He was encouraged to continue to take his blood pressure medications as prescribed. [CH]    Clinical Course User Index [CH] Anaijah Augsburger, Barbette Hair, MD   MDM Rules/Calculators/A&P                          Patient presents with ongoing lower extremity edema.  He is overall nontoxic and  vital signs are reassuring.  He satting 98% on room air.  No shortness of breath or chest pain at this time.  He has a history of heart failure.  He also has a longstanding history of high blood pressure.  He reports compliance with medication including increased diuretic over the last several days.  He does have pitting edema on exam but is otherwise nontoxic.  Labs obtained.  Chest x-ray is stable with stable cardiomegaly.  Additionally, BNP is actually improved.  Patient does have a slightly uptrending creatinine.  This may be related to increased diuretic use but also may be contributing to decreased urine output.  While patient is nontoxic and does not appear to be acutely decompensated, he may be a difficult patient to continue to diurese given his kidney function.  We discussed admission versus discharge with close outpatient follow-up.  Patient would like to go home.  I discussed with him that it is imperative that he be followed closely for his kidney function.  He was given 1 additional dose of IV Lasix here and I recommend he maintain his dosage at home.  Recommend very close follow-up with nephrology and cardiology.  Patient stated understanding.  After history, exam, and medical workup I feel the patient has been appropriately medically screened and is safe for discharge home. Pertinent diagnoses were discussed with the patient. Patient was given return precautions.  Final Clinical Impression(s) / ED Diagnoses Final diagnoses:  Leg edema  Acute renal failure superimposed on chronic kidney disease, unspecified CKD stage, unspecified acute renal failure type ([place])    Rx /  DC Orders ED Discharge Orders    None       Arrin Ishler, Barbette Hair, MD 08/23/20 [phone]

## 2020-09-05 ENCOUNTER — Encounter (HOSPITAL_COMMUNITY): Payer: Self-pay | Admitting: Cardiology

## 2020-09-05 ENCOUNTER — Encounter (HOSPITAL_COMMUNITY): Payer: Self-pay

## 2020-09-13 ENCOUNTER — Encounter (HOSPITAL_COMMUNITY): Payer: Self-pay | Admitting: Emergency Medicine

## 2020-09-13 ENCOUNTER — Other Ambulatory Visit: Payer: Self-pay

## 2020-09-13 ENCOUNTER — Emergency Department (HOSPITAL_COMMUNITY)
Admission: EM | Admit: 2020-09-13 | Discharge: 2020-09-14 | Disposition: A | Discharge status: Home / Self Care | Payer: Self-pay | Attending: Emergency Medicine | Admitting: Emergency Medicine

## 2020-09-13 DIAGNOSIS — E1142 Type 2 diabetes mellitus with diabetic polyneuropathy: Secondary | ICD-10-CM | POA: Insufficient documentation

## 2020-09-13 DIAGNOSIS — E1165 Type 2 diabetes mellitus with hyperglycemia: Secondary | ICD-10-CM | POA: Insufficient documentation

## 2020-09-13 DIAGNOSIS — N183 Chronic kidney disease, stage 3 unspecified: Secondary | ICD-10-CM | POA: Insufficient documentation

## 2020-09-13 DIAGNOSIS — I129 Hypertensive chronic kidney disease with stage 1 through stage 4 chronic kidney disease, or unspecified chronic kidney disease: Secondary | ICD-10-CM | POA: Insufficient documentation

## 2020-09-13 DIAGNOSIS — R739 Hyperglycemia, unspecified: Secondary | ICD-10-CM

## 2020-09-13 DIAGNOSIS — E1122 Type 2 diabetes mellitus with diabetic chronic kidney disease: Secondary | ICD-10-CM | POA: Insufficient documentation

## 2020-09-13 DIAGNOSIS — N179 Acute kidney failure, unspecified: Secondary | ICD-10-CM | POA: Insufficient documentation

## 2020-09-13 DIAGNOSIS — Z79899 Other long term (current) drug therapy: Secondary | ICD-10-CM | POA: Insufficient documentation

## 2020-09-13 LAB — CBC WITH DIFFERENTIAL/PLATELET
Abs Immature Granulocytes: 0.02 10*3/uL (ref 0.00–0.07)
Basophils Absolute: 0 10*3/uL (ref 0.0–0.1)
Basophils Relative: 1 %
Eosinophils Absolute: 0.2 10*3/uL (ref 0.0–0.5)
Eosinophils Relative: 2 %
HCT: 35.1 % — ABNORMAL LOW (ref 39.0–52.0)
Hemoglobin: 10.9 g/dL — ABNORMAL LOW (ref 13.0–17.0)
Immature Granulocytes: 0 %
Lymphocytes Relative: 25 %
Lymphs Abs: 1.7 10*3/uL (ref 0.7–4.0)
MCH: 26.8 pg (ref 26.0–34.0)
MCHC: 31.1 g/dL (ref 30.0–36.0)
MCV: 86.5 fL (ref 80.0–100.0)
Monocytes Absolute: 0.7 10*3/uL (ref 0.1–1.0)
Monocytes Relative: 11 %
Neutro Abs: 4 10*3/uL (ref 1.7–7.7)
Neutrophils Relative %: 61 %
Platelets: 350 10*3/uL (ref 150–400)
RBC: 4.06 MIL/uL — ABNORMAL LOW (ref 4.22–5.81)
RDW: 13.2 % (ref 11.5–15.5)
WBC: 6.5 10*3/uL (ref 4.0–10.5)
nRBC: 0 % (ref 0.0–0.2)

## 2020-09-13 LAB — CBG MONITORING, ED: Glucose-Capillary: 333 mg/dL — ABNORMAL HIGH (ref 70–99)

## 2020-09-13 NOTE — ED Provider Notes (Addendum)
Emergency Medicine Provider Triage Evaluation Note  [name] 50 y.o. M was evaluated in triage.  Pt complains of hyperglycemia, increased urinary frequency.    Review of Systems  Positive: Urinary frequency, nausea  Negative: abd pain, vomiting  Physical Exam  BP 134/82   Pulse 70   Temp 98.2 F (36.8 C) (Oral)   Resp 18   Ht '5\' 4"'$  (1.626 m)   Wt 65.8 kg   SpO2 100%   BMI 24.89 kg/m  Gen:   Awake, no distress   HEENT:  Atraumatic  Resp:  Normal effort  Cardiac:  Normal rate  Abd:   Nondistended, nontender  MSK:   Moves extremities without difficulty  Neuro:  Speech clear   Medical Decision Making  Medically screening exam initiated at 3:55 AM.  Appropriate orders placed.  AXEL LUIKART  was informed that the remainder of the evaluation will be completed by another provider, this initial triage assessment does not replace that evaluation, and the importance of remaining in the ED until their evaluation is complete.   Clinical Impression  hyperglycemia   Portions of this note were generated with Dragon dictation software. Dictation errors may occur despite best attempts at proofreading.     Volanda Napoleon, PA-C 09/13/20 2304    Volanda Napoleon, PA-C 09/14/20 0022    Orpah Greek, MD 09/14/20 [phone]

## 2020-09-13 NOTE — ED Triage Notes (Signed)
Pt reports he has not checked his "sugar in a fed days."  He believes his sugar is high because "I'm peeing a lot."  No other complaints.

## 2020-09-14 ENCOUNTER — Emergency Department (HOSPITAL_COMMUNITY)
Admission: EM | Admit: 2020-09-14 | Discharge: 2020-09-15 | Disposition: A | Discharge status: Home / Self Care | Payer: Self-pay | Attending: Emergency Medicine | Admitting: Emergency Medicine

## 2020-09-14 DIAGNOSIS — E1122 Type 2 diabetes mellitus with diabetic chronic kidney disease: Secondary | ICD-10-CM | POA: Insufficient documentation

## 2020-09-14 DIAGNOSIS — I509 Heart failure, unspecified: Secondary | ICD-10-CM | POA: Insufficient documentation

## 2020-09-14 DIAGNOSIS — R609 Edema, unspecified: Secondary | ICD-10-CM | POA: Insufficient documentation

## 2020-09-14 DIAGNOSIS — N189 Chronic kidney disease, unspecified: Secondary | ICD-10-CM | POA: Insufficient documentation

## 2020-09-14 DIAGNOSIS — E1165 Type 2 diabetes mellitus with hyperglycemia: Secondary | ICD-10-CM | POA: Insufficient documentation

## 2020-09-14 DIAGNOSIS — Z794 Long term (current) use of insulin: Secondary | ICD-10-CM | POA: Insufficient documentation

## 2020-09-14 DIAGNOSIS — I129 Hypertensive chronic kidney disease with stage 1 through stage 4 chronic kidney disease, or unspecified chronic kidney disease: Secondary | ICD-10-CM | POA: Insufficient documentation

## 2020-09-14 DIAGNOSIS — Z79899 Other long term (current) drug therapy: Secondary | ICD-10-CM | POA: Insufficient documentation

## 2020-09-14 LAB — I-STAT VENOUS BLOOD GAS, ED
Acid-Base Excess: 0 mmol/L (ref 0.0–2.0)
Bicarbonate: 27 mmol/L (ref 20.0–28.0)
Calcium, Ion: 1 mmol/L — ABNORMAL LOW (ref 1.15–1.40)
HCT: 33 % — ABNORMAL LOW (ref 39.0–52.0)
Hemoglobin: 11.2 g/dL — ABNORMAL LOW (ref 13.0–17.0)
O2 Saturation: 75 %
Potassium: 3.7 mmol/L (ref 3.5–5.1)
Sodium: 137 mmol/L (ref 135–145)
TCO2: 29 mmol/L (ref 22–32)
pCO2, Ven: 54.1 mmHg (ref 44.0–60.0)
pH, Ven: 7.306 (ref 7.250–7.430)
pO2, Ven: 45 mmHg (ref 32.0–45.0)

## 2020-09-14 LAB — URINALYSIS, ROUTINE W REFLEX MICROSCOPIC
Bilirubin Urine: NEGATIVE
Glucose, UA: >=500 mg/dL — AB
Ketones, ur: NEGATIVE mg/dL
Leukocytes,Ua: NEGATIVE
Nitrite: NEGATIVE
Protein, ur: >=300 mg/dL — AB
Specific Gravity, Urine: 1.014 (ref 1.005–1.030)
pH: 6 (ref 5.0–8.0)

## 2020-09-14 LAB — COMPREHENSIVE METABOLIC PANEL
ALT: 15 U/L (ref 0–44)
AST: 24 U/L (ref 15–41)
Albumin: 1.9 g/dL — ABNORMAL LOW (ref 3.5–5.0)
Alkaline Phosphatase: 106 U/L (ref 38–126)
Anion gap: 6 (ref 5–15)
BUN: 35 mg/dL — ABNORMAL HIGH (ref 6–20)
CO2: 27 mmol/L (ref 22–32)
Calcium: 7.4 mg/dL — ABNORMAL LOW (ref 8.9–10.3)
Chloride: 102 mmol/L (ref 98–111)
Creatinine, Ser: 4.35 mg/dL — ABNORMAL HIGH (ref 0.61–1.24)
GFR, Estimated: 16 mL/min — ABNORMAL LOW (ref >60)
Glucose, Bld: 334 mg/dL — ABNORMAL HIGH (ref 70–99)
Potassium: 3.6 mmol/L (ref 3.5–5.1)
Sodium: 135 mmol/L (ref 135–145)
Total Bilirubin: 0.4 mg/dL (ref 0.3–1.2)
Total Protein: 5.5 g/dL — ABNORMAL LOW (ref 6.5–8.1)

## 2020-09-14 MED ORDER — INSULIN ASPART 100 UNIT/ML ~~LOC~~ SOLN
4.0000 [IU] | Freq: Once | SUBCUTANEOUS | Status: AC
  Administered 2020-09-14: 4 [IU] via INTRAVENOUS

## 2020-09-14 MED ORDER — SODIUM CHLORIDE 0.9 % IV BOLUS: 500.0000 mL | Freq: Once | INTRAVENOUS | Status: DC

## 2020-09-14 NOTE — ED Provider Notes (Signed)
[facility] EMERGENCY DEPARTMENT Provider Note   CSN: [id number] Arrival date & time: 09/13/20  2250     History Chief Complaint  Patient presents with  . Hyperglycemia    [name] is a 50 y.o. male past medical history of diabetes, hypertension, hyperlipidemia who presents for evaluation of hyperglycemia.  Patient with history of type 2 diabetes.  He reports he is on insulin.  He states that he was at work and noted that he was peeing frequently and so he thought his sugar might be high.  He states that normally when his sugar is high, he urinates more frequently. This is what prompted him to come to the emergency department.  He did not check his sugar.  He reports that he has not taken his insulin in the last 2 to 3 days.  He states he has it but he does not has taken it.  He has had some nausea but denies any vomiting, abdominal pain.  He denies any dysuria, fevers, chest pain, difficulty breathing.  He states when he checks his sugars at home, they are routinely between 200-250.  The history is provided by the patient.       Past Medical History:  Diagnosis Date  . Anxiety 02/2019  . Anxiety 10/2019  . Diabetes mellitus type II, uncontrolled ([place]) 07/17/2006  . Elevated alkaline phosphatase level 01/2019  . ESSENTIAL HYPERTENSION 07/17/2006  . HYPERLIPIDEMIA 11/24/2008  . Insomnia 02/2019  . Left bundle branch block 02/22/2019  . Lesion of lip 10/2019  . Pneumonia 10/22/2011  . Suicidal ideations 02/2019  . Vitamin D deficiency 01/2019    Patient Active Problem List   Diagnosis Date Noted  . Lower extremity edema 03/07/2020  . CKD (chronic kidney disease) stage 3, GFR 30-59 ml/min (HCC) 02/22/2020  . Left ventricular dysfunction 04/27/2019  . Hemoglobin A1C greater than 9%, indicating poor diabetic control 07/29/2018  . New onset left bundle branch block (LBBB)   . 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 Hyperlipidemia LDL goal <70  11/24/2008  . Uncontrolled type 2 diabetes mellitus with peripheral neuropathy ([place]) 07/17/2006  . Essential hypertension 07/17/2006    Past Surgical History:  Procedure Laterality Date  . ARM SURGERY         Family History  Problem Relation Age of Onset  . Hypertension Mother 59  . Cancer Mother        bladder cancer  . Alcohol abuse Father   . Hypertension Brother     Social History   Tobacco Use  . Smoking status: Never Smoker  . Smokeless tobacco: Never Used  Vaping Use  . Vaping Use: Never used  Substance Use Topics  . Alcohol use: No    Alcohol/week: 0.0 standard drinks  . Drug use: No    Home Medications Prior to Admission medications   Medication Sig Start Date End Date Taking? Authorizing Provider  amLODipine (NORVASC) 10 MG tablet Take 1 tablet (10 mg total) by mouth daily. 02/22/20 02/11/22  [name], MD  atorvastatin (LIPITOR) 40 MG tablet TAKE 1 TABLET (40 MG TOTAL) BY MOUTH DAILY. Patient not taking: Reported on 08/11/2020 02/23/20 02/22/21  [name], MD  Blood Glucose Monitoring Suppl (TRUE METRIX GO GLUCOSE METER) w/Device KIT USE TO MEASURE BLOOD SUGAR TWICE A DAY 02/22/20 02/21/21  [name], MD  Blood Glucose Monitoring Suppl (TRUE METRIX METER) w/Device KIT Use to measure blood sugar twice a day Patient not taking:  Reported on 08/11/2020 02/22/20   [name], MD  carvedilol (COREG) 25 MG tablet TAKE 1 TABLET (25 MG TOTAL) BY MOUTH 2 (TWO) TIMES DAILY. 03/07/20 03/07/21  Lorretta Harp, MD  Continuous Blood Gluc Receiver (FREESTYLE LIBRE 14 DAY READER) DEVI 1 EACH BY DOES NOT APPLY ROUTE 4 (FOUR) TIMES DAILY - BEFORE MEALS AND AT BEDTIME. 08/11/20 08/11/21  [name], FNP  Continuous Blood Gluc Sensor (FREESTYLE LIBRE 14 DAY SENSOR) MISC 1 each by Does not apply route 4 (four) times daily -  before meals and at bedtime. 08/11/20   [name], FNP  furosemide (LASIX) 40 MG tablet Take 1 tablet (40 mg total) by  mouth daily. Take for 4 days then stop 08/10/20   Montine Circle, PA-C  furosemide (LASIX) 40 MG tablet TAKE 1 TABLET BY MOUTH DAILY AND AS DIRECTED 07/03/20 07/03/21  Claudia Desanctis, MD  glucose blood test strip Use to check fasting blood sugar once daily. 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 Insulin dependent Patient not taking: Reported on 08/11/2020 02/22/20   [name], MD  hydrALAZINE (APRESOLINE) 25 MG tablet TAKE 1 TABLET (25 MG TOTAL) BY MOUTH 3 (THREE) TIMES DAILY. Patient not taking: Reported on 08/11/2020 03/07/20 03/07/21  Lorretta Harp, MD  insulin aspart (NOVOLOG FLEXPEN) 100 UNIT/ML FlexPen Inject 10 Units into the skin 2 (two) times daily with a meal. Patient not taking: Reported on 08/11/2020 02/23/20   [name], MD  insulin detemir (LEVEMIR FLEXTOUCH) 100 UNIT/ML FlexPen Inject 35 Units into the skin daily. Patient not taking: Reported on 08/11/2020 02/22/20   [name], MD  Insulin Pen Needle (PEN NEEDLES 29GX1/2") 29G X 12MM MISC Use with insulin pens Patient not taking: Reported on 08/11/2020 02/22/20   [name], MD  potassium chloride SA (KLOR-CON) 20 MEQ tablet TAKE 1 TABLET (20 MEQ TOTAL) BY MOUTH DAILY. TAKE FOR 4 DAYS THEN STOP Patient not taking: Reported on 08/11/2020 06/29/20 06/29/21  Deberah Pelton, NP  sildenafil (VIAGRA) 25 MG tablet Take 1 tablet (25 mg total) by mouth as needed for erectile dysfunction. for erectile dysfunction. Patient not taking: Reported on 08/11/2020 10/13/19   [name], FNP  sitaGLIPtin (JANUVIA) 25 MG tablet Take 1 tablet (25 mg total) by mouth daily. Patient not taking: Reported on 08/11/2020 02/22/20   [name], MD  TRUEplus Lancets 28G MISC USE TO MEASURE BLOOD SUGAR TWICE A DAY Patient not taking: Reported on 08/11/2020 02/22/20 02/21/21  [name], MD  Vitamin D, Ergocalciferol, (DRISDOL) 1.25 MG (50000 UNIT) CAPS capsule Take 50,000 Units by mouth once a week. Patient not taking: Reported on 08/11/2020  01/26/20   [provider]  Vitamin D, Ergocalciferol, (DRISDOL) 1.25 MG (50000 UNIT) CAPS capsule TAKE 1 CAPSULE BY MOUTH ONCE A WEEK 07/07/20 07/07/21  Claudia Desanctis, MD  gabapentin (NEURONTIN) 300 MG capsule Take 1 capsule (300 mg total) by mouth 3 (three) times daily. Patient not taking: Reported on 07/27/2019 07/29/18 08/30/19  [name], FNP    Allergies    Sulfa antibiotics, Bactrim [sulfamethoxazole-trimethoprim], Cephalexin, and Other  Review of Systems   Review of Systems  Constitutional: Negative for fever.  Respiratory: Negative for shortness of breath.   Cardiovascular: Negative for chest pain.  Gastrointestinal: Negative for abdominal pain, nausea and vomiting.  Genitourinary: Positive for frequency. Negative for dysuria and hematuria.  Neurological: Negative for headaches.  All other systems reviewed and are negative.   Physical Exam  Updated Vital Signs BP (!) 142/87   Pulse 80   Temp 98.4 F (36.9 C) (Oral)   Resp 18   Ht _0  (1.702 m)   Wt 68.9 kg   SpO2 99%   BMI 23.81 kg/m   Physical Exam Vitals and nursing note reviewed.  Constitutional:      Appearance: Normal appearance. He is well-developed.     Comments: Well appearing  HENT:     Head: Normocephalic and atraumatic.  Eyes:     General: Lids are normal.     Conjunctiva/sclera: Conjunctivae normal.     Pupils: Pupils are equal, round, and reactive to light.  Cardiovascular:     Rate and Rhythm: Normal rate and regular rhythm.     Pulses: Normal pulses.     Heart sounds: Normal heart sounds. No murmur heard. No friction rub. No gallop.   Pulmonary:     Effort: Pulmonary effort is normal.     Breath sounds: Normal breath sounds.     Comments: Lungs clear to auscultation bilaterally.  Symmetric chest rise.  No wheezing, rales, rhonchi. Abdominal:     Palpations: Abdomen is soft. Abdomen is not rigid.     Tenderness: There is no abdominal tenderness. There is no guarding.      Comments: Abdomen is soft, non-distended, non-tender. No rigidity, No guarding. No peritoneal signs.  Musculoskeletal:        General: Normal range of motion.     Cervical back: Full passive range of motion without pain.  Skin:    General: Skin is warm and dry.     Capillary Refill: Capillary refill takes less than 2 seconds.  Neurological:     Mental Status: He is alert and oriented to person, place, and time.  Psychiatric:        Speech: Speech normal.     ED Results / Procedures / Treatments   Labs (all labs ordered are listed, but only abnormal results are displayed) Labs Reviewed  URINALYSIS, ROUTINE W REFLEX MICROSCOPIC - Abnormal; Notable for the following components:      Result Value   APPearance HAZY (*)    Glucose, UA >=500 (*)    Hgb urine dipstick SMALL (*)    Protein, ur >=300 (*)    Bacteria, UA RARE (*)    All other components within normal limits  COMPREHENSIVE METABOLIC PANEL - Abnormal; Notable for the following components:   Glucose, Bld 334 (*)    BUN 35 (*)    Creatinine, Ser 4.35 (*)    Calcium 7.4 (*)    Total Protein 5.5 (*)    Albumin 1.9 (*)    GFR, Estimated 16 (*)    All other components within normal limits  CBC WITH DIFFERENTIAL/PLATELET - Abnormal; Notable for the following components:   RBC 4.06 (*)    Hemoglobin 10.9 (*)    HCT 35.1 (*)    All other components within normal limits  CBG MONITORING, ED - Abnormal; Notable for the following components:   Glucose-Capillary 333 (*)    All other components within normal limits  I-STAT VENOUS BLOOD GAS, ED - Abnormal; Notable for the following components:   Calcium, Ion 1.00 (*)    HCT 33.0 (*)    Hemoglobin 11.2 (*)    All other components within normal limits    EKG None  Radiology No results found.  Procedures Procedures   Medications Ordered in ED Medications  sodium chloride 0.9 % bolus 500 mL (  has no administration in time range)  insulin aspart (novoLOG) injection 4 Units  (4 Units Intravenous Given 09/14/20 0322)    ED Course  I have reviewed the triage vital signs and the nursing notes.  Pertinent labs & imaging results that were available during my care of the patient were reviewed by me and considered in my medical decision making (see chart for details).    MDM Rules/Calculators/A&P                          50 year old male past medical history of diabetes who presents for evaluation of concerns of hyperglycemia.  Reports he was at work and states he had some urinary frequency and so he was concerned that his blood sugar might be high.  He does report that he is on insulin but states he has not taken it over the last several days.  Has had some nausea but no vomiting, abdominal pain, dysuria, fevers.  On initial arrival, he is afebrile, nontoxic-appearing.  Vital signs are stable.  Abdomen exam is benign.  Concern for hyperglycemia versus DKA.  Low suspicion for UTI but is also consideration.  Plan for labs.  UA shows no ketones.  No evidence of infectious etiology.  CMP shows hyperglycemia of 334.  His bicarb is 27.  He does not have an anion gap.  His BUN is 35, creatinine of 4.35.  He has chronic kidney disease.  At baseline, his creatinine is routinely in the 3 range.  He does not have any abdominal pain that would make me concern for obstructive cause.  He is tolerating p.o. and has not had any vomiting here.  VBG shows pH of 7.306.  At this time, his work-up is not concerning for DKA.  Patient given insulin here in the ED.  Reevaluation.  Patient is sleeping on bed with no signs of distress.  I discussed with him that his kidney function is slightly worse than his baseline today.  We discussed IV fluid bolus here in the ED versus having him do p.o. fluids at home.  Patient states he would rather go home.  I instructed him that he will need to have this repeated by his primary care doctor in a week or so to make sure this is resolved.  Encourage p.o. fluids.   Also encouraged him to take his insulin as directed. At this time, patient exhibits no emergent life-threatening condition that require further evaluation in ED. Patient had ample opportunity for questions and discussion. All patient's questions were answered with full understanding. Strict return precautions discussed. Patient expresses understanding and agreement to plan.   Portions of this note were generated with Lobbyist. Dictation errors may occur despite best attempts at proofreading.  Final Clinical Impression(s) / ED Diagnoses Final diagnoses:  Hyperglycemia  AKI (acute kidney injury) [facility])    Rx / DC Orders ED Discharge Orders    None       Desma Mcgregor 09/14/20 0604    Orpah Greek, MD 09/14/20 [phone]

## 2020-09-14 NOTE — ED Triage Notes (Signed)
Pt reports bilat leg swelling starting today, Pt states he has been taking his meds

## 2020-09-14 NOTE — Discharge Instructions (Addendum)
As we discussed, your sugar was high.  Is very important that you take your insulin as directed.  Additionally, your kidney function today was slightly worse than it is normally.  As we discussed, it is important to stay hydrated at home.  You need to have your primary care doctor recheck this in a week to ensure that it is not getting any worse.  Return the emergency department for any abdominal pain, vomiting, pain with urination, fevers or any other worsening concerning symptoms.

## 2020-09-14 NOTE — ED Notes (Signed)
Placed urinal at pts bedside. Pt states understanding that we need urine.

## 2020-09-15 LAB — BASIC METABOLIC PANEL
Anion gap: 5 (ref 5–15)
BUN: 34 mg/dL — ABNORMAL HIGH (ref 6–20)
CO2: 26 mmol/L (ref 22–32)
Calcium: 7 mg/dL — ABNORMAL LOW (ref 8.9–10.3)
Chloride: 104 mmol/L (ref 98–111)
Creatinine, Ser: 4.16 mg/dL — ABNORMAL HIGH (ref 0.61–1.24)
GFR, Estimated: 17 mL/min — ABNORMAL LOW (ref >60)
Glucose, Bld: 372 mg/dL — ABNORMAL HIGH (ref 70–99)
Potassium: 3.7 mmol/L (ref 3.5–5.1)
Sodium: 135 mmol/L (ref 135–145)

## 2020-09-15 NOTE — ED Provider Notes (Signed)
[facility] EMERGENCY DEPARTMENT Provider Note  CSN: [id number] Arrival date & time: 09/14/20 2308    History Chief Complaint  Patient presents with  . Leg Swelling    HPI  [name] is a 50 y.o. male with history of HTN, CKD and CHF presents for evaluation of LE edema. He has frequent ED visits for similar including two previous ED visits earlier this month and also a visit last night for hyperglycemia. By patient report his leg swelling 'comes and goes' and was not there yesterday but appeared during the day today. He had labs done last night which showed worsening kidney function but the patient declined any IVF or further management and was ultimately discharged. He reports compliance with all of his medications including his Lasix. Denies any CP, SOB, or fever.    Past Medical History:  Diagnosis Date  . Anxiety 02/2019  . Anxiety 10/2019  . Diabetes mellitus type II, uncontrolled ([place]) 07/17/2006  . Elevated alkaline phosphatase level 01/2019  . ESSENTIAL HYPERTENSION 07/17/2006  . HYPERLIPIDEMIA 11/24/2008  . Insomnia 02/2019  . Left bundle branch block 02/22/2019  . Lesion of lip 10/2019  . Pneumonia 10/22/2011  . Suicidal ideations 02/2019  . Vitamin D deficiency 01/2019    Past Surgical History:  Procedure Laterality Date  . ARM SURGERY      Family History  Problem Relation Age of Onset  . Hypertension Mother 91  . Cancer Mother        bladder cancer  . Alcohol abuse Father   . Hypertension Brother     Social History   Tobacco Use  . Smoking status: Never Smoker  . Smokeless tobacco: Never Used  Vaping Use  . Vaping Use: Never used  Substance Use Topics  . Alcohol use: No    Alcohol/week: 0.0 standard drinks  . Drug use: No     Home Medications Prior to Admission medications   Medication Sig Start Date End Date Taking? Authorizing Provider  amLODipine (NORVASC) 10 MG tablet Take 1 tablet (10 mg total) by mouth daily. 02/22/20 02/11/22   [name], MD  atorvastatin (LIPITOR) 40 MG tablet TAKE 1 TABLET (40 MG TOTAL) BY MOUTH DAILY. Patient not taking: Reported on 08/11/2020 02/23/20 02/22/21  [name], MD  Blood Glucose Monitoring Suppl (TRUE METRIX GO GLUCOSE METER) w/Device KIT USE TO MEASURE BLOOD SUGAR TWICE A DAY 02/22/20 02/21/21  [name], MD  Blood Glucose Monitoring Suppl (TRUE METRIX METER) w/Device KIT Use to measure blood sugar twice a day Patient not taking: Reported on 08/11/2020 02/22/20   [name], MD  carvedilol (COREG) 25 MG tablet TAKE 1 TABLET (25 MG TOTAL) BY MOUTH 2 (TWO) TIMES DAILY. 03/07/20 03/07/21  Lorretta Harp, MD  Continuous Blood Gluc Receiver (FREESTYLE LIBRE 14 DAY READER) DEVI 1 EACH BY DOES NOT APPLY ROUTE 4 (FOUR) TIMES DAILY - BEFORE MEALS AND AT BEDTIME. 08/11/20 08/11/21  [name], FNP  Continuous Blood Gluc Sensor (FREESTYLE LIBRE 14 DAY SENSOR) MISC 1 each by Does not apply route 4 (four) times daily -  before meals and at bedtime. 08/11/20   [name], FNP  furosemide (LASIX) 40 MG tablet Take 1 tablet (40 mg total) by mouth daily. Take for 4 days then stop 08/10/20   Montine Circle, PA-C  furosemide (LASIX) 40 MG tablet TAKE 1 TABLET BY MOUTH DAILY AND AS DIRECTED 07/03/20 07/03/21  Claudia Desanctis, MD  glucose blood test  strip Use to check fasting blood sugar once daily. 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 Insulin dependent Patient not taking: Reported on 08/11/2020 02/22/20   [name], MD  hydrALAZINE (APRESOLINE) 25 MG tablet TAKE 1 TABLET (25 MG TOTAL) BY MOUTH 3 (THREE) TIMES DAILY. Patient not taking: Reported on 08/11/2020 03/07/20 03/07/21  Lorretta Harp, MD  insulin aspart (NOVOLOG FLEXPEN) 100 UNIT/ML FlexPen Inject 10 Units into the skin 2 (two) times daily with a meal. Patient not taking: Reported on 08/11/2020 02/23/20   [name], MD  insulin detemir (LEVEMIR FLEXTOUCH) 100 UNIT/ML FlexPen Inject 35 Units into the skin daily. Patient  not taking: Reported on 08/11/2020 02/22/20   [name], MD  Insulin Pen Needle (PEN NEEDLES 29GX1/2") 29G X 12MM MISC Use with insulin pens Patient not taking: Reported on 08/11/2020 02/22/20   [name], MD  potassium chloride SA (KLOR-CON) 20 MEQ tablet TAKE 1 TABLET (20 MEQ TOTAL) BY MOUTH DAILY. TAKE FOR 4 DAYS THEN STOP Patient not taking: Reported on 08/11/2020 06/29/20 06/29/21  Deberah Pelton, NP  sildenafil (VIAGRA) 25 MG tablet Take 1 tablet (25 mg total) by mouth as needed for erectile dysfunction. for erectile dysfunction. Patient not taking: Reported on 08/11/2020 10/13/19   [name], FNP  sitaGLIPtin (JANUVIA) 25 MG tablet Take 1 tablet (25 mg total) by mouth daily. Patient not taking: Reported on 08/11/2020 02/22/20   [name], MD  TRUEplus Lancets 28G MISC USE TO MEASURE BLOOD SUGAR TWICE A DAY Patient not taking: Reported on 08/11/2020 02/22/20 02/21/21  [name], MD  Vitamin D, Ergocalciferol, (DRISDOL) 1.25 MG (50000 UNIT) CAPS capsule Take 50,000 Units by mouth once a week. Patient not taking: Reported on 08/11/2020 01/26/20   [provider]  Vitamin D, Ergocalciferol, (DRISDOL) 1.25 MG (50000 UNIT) CAPS capsule TAKE 1 CAPSULE BY MOUTH ONCE A WEEK 07/07/20 07/07/21  Claudia Desanctis, MD  gabapentin (NEURONTIN) 300 MG capsule Take 1 capsule (300 mg total) by mouth 3 (three) times daily. Patient not taking: Reported on 07/27/2019 07/29/18 08/30/19  [name], FNP     Allergies    Sulfa antibiotics, Bactrim [sulfamethoxazole-trimethoprim], Cephalexin, and Other   Review of Systems   Review of Systems A comprehensive review of systems was completed and negative except as noted in HPI.    Physical Exam BP (!) 156/86 (BP Location: Right Arm)   Pulse 86   Temp 98.7 F (37.1 C) (Oral)   Resp 16   SpO2 100%   Physical Exam Vitals and nursing note reviewed.  Constitutional:      Appearance: Normal appearance.  HENT:     Head:  Normocephalic and atraumatic.     Nose: Nose normal.     Mouth/Throat:     Mouth: Mucous membranes are moist.  Eyes:     Extraocular Movements: Extraocular movements intact.     Conjunctiva/sclera: Conjunctivae normal.  Cardiovascular:     Rate and Rhythm: Normal rate.  Pulmonary:     Effort: Pulmonary effort is normal.     Breath sounds: Normal breath sounds.  Abdominal:     General: Abdomen is flat.     Palpations: Abdomen is soft.     Tenderness: There is no abdominal tenderness.  Musculoskeletal:        General: No swelling. Normal range of motion.     Cervical back: Neck supple.     Right lower leg: Edema (2+) present.     Left  lower leg: Edema (2+) present.  Skin:    General: Skin is warm and dry.  Neurological:     General: No focal deficit present.     Mental Status: He is alert.  Psychiatric:        Mood and Affect: Mood normal.      ED Results / Procedures / Treatments   Labs (all labs ordered are listed, but only abnormal results are displayed) Labs Reviewed  BASIC METABOLIC PANEL - Abnormal; Notable for the following components:      Result Value   Glucose, Bld 372 (*)    BUN 34 (*)    Creatinine, Ser 4.16 (*)    Calcium 7.0 (*)    GFR, Estimated 17 (*)    All other components within normal limits    EKG None  Radiology No results found.  Procedures Procedures  Medications Ordered in the ED Medications - No data to display   MDM Rules/Calculators/A&P MDM Patient with LE edema, chronic appearing, doubt this is really 12 hours of onset. Will recheck BMP to compare to results from last night given his worsening kidney function. He denies any other acute complaints now.   ED Course  I have reviewed the triage vital signs and the nursing notes.  Pertinent labs & imaging results that were available during my care of the patient were reviewed by me and considered in my medical decision making (see chart for details).  Clinical Course as of  09/15/20 0155  Fri Sep 15, 2020  0150 BMP shows Cr improved from yesterday. Recommend he follow up with his PCP and/or Cardiology for medication adjustments if needed for his chronic LE edema.  [CS]    Clinical Course User Index [CS] Truddie Hidden, MD    Final Clinical Impression(s) / ED Diagnoses Final diagnoses:  Peripheral edema    Rx / DC Orders ED Discharge Orders    None       Truddie Hidden, MD 09/15/20 [phone]

## 2020-09-17 ENCOUNTER — Other Ambulatory Visit: Payer: Self-pay

## 2020-09-17 ENCOUNTER — Encounter (HOSPITAL_COMMUNITY): Payer: Self-pay | Admitting: Emergency Medicine

## 2020-09-17 ENCOUNTER — Emergency Department (HOSPITAL_COMMUNITY)
Admission: EM | Admit: 2020-09-17 | Discharge: 2020-09-17 | Disposition: A | Discharge status: Home / Self Care | Payer: Self-pay | Attending: Emergency Medicine | Admitting: Emergency Medicine

## 2020-09-17 DIAGNOSIS — Z79899 Other long term (current) drug therapy: Secondary | ICD-10-CM | POA: Insufficient documentation

## 2020-09-17 DIAGNOSIS — R600 Localized edema: Secondary | ICD-10-CM

## 2020-09-17 DIAGNOSIS — R739 Hyperglycemia, unspecified: Secondary | ICD-10-CM

## 2020-09-17 DIAGNOSIS — I5030 Unspecified diastolic (congestive) heart failure: Secondary | ICD-10-CM | POA: Insufficient documentation

## 2020-09-17 DIAGNOSIS — R2243 Localized swelling, mass and lump, lower limb, bilateral: Secondary | ICD-10-CM | POA: Insufficient documentation

## 2020-09-17 DIAGNOSIS — N183 Chronic kidney disease, stage 3 unspecified: Secondary | ICD-10-CM | POA: Insufficient documentation

## 2020-09-17 DIAGNOSIS — I130 Hypertensive heart and chronic kidney disease with heart failure and stage 1 through stage 4 chronic kidney disease, or unspecified chronic kidney disease: Secondary | ICD-10-CM | POA: Insufficient documentation

## 2020-09-17 DIAGNOSIS — Z794 Long term (current) use of insulin: Secondary | ICD-10-CM | POA: Insufficient documentation

## 2020-09-17 DIAGNOSIS — E1165 Type 2 diabetes mellitus with hyperglycemia: Secondary | ICD-10-CM | POA: Insufficient documentation

## 2020-09-17 LAB — CBC WITH DIFFERENTIAL/PLATELET
Abs Immature Granulocytes: 0.02 10*3/uL (ref 0.00–0.07)
Basophils Absolute: 0 10*3/uL (ref 0.0–0.1)
Basophils Relative: 0 %
Eosinophils Absolute: 0.3 10*3/uL (ref 0.0–0.5)
Eosinophils Relative: 4 %
HCT: 32.4 % — ABNORMAL LOW (ref 39.0–52.0)
Hemoglobin: 10.3 g/dL — ABNORMAL LOW (ref 13.0–17.0)
Immature Granulocytes: 0 %
Lymphocytes Relative: 18 %
Lymphs Abs: 1.3 10*3/uL (ref 0.7–4.0)
MCH: 27.2 pg (ref 26.0–34.0)
MCHC: 31.8 g/dL (ref 30.0–36.0)
MCV: 85.5 fL (ref 80.0–100.0)
Monocytes Absolute: 0.6 10*3/uL (ref 0.1–1.0)
Monocytes Relative: 8 %
Neutro Abs: 5 10*3/uL (ref 1.7–7.7)
Neutrophils Relative %: 70 %
Platelets: 341 10*3/uL (ref 150–400)
RBC: 3.79 MIL/uL — ABNORMAL LOW (ref 4.22–5.81)
RDW: 13.2 % (ref 11.5–15.5)
WBC: 7.2 10*3/uL (ref 4.0–10.5)
nRBC: 0 % (ref 0.0–0.2)

## 2020-09-17 LAB — BASIC METABOLIC PANEL
Anion gap: 6 (ref 5–15)
BUN: 47 mg/dL — ABNORMAL HIGH (ref 6–20)
CO2: 23 mmol/L (ref 22–32)
Calcium: 7 mg/dL — ABNORMAL LOW (ref 8.9–10.3)
Chloride: 108 mmol/L (ref 98–111)
Creatinine, Ser: 3.98 mg/dL — ABNORMAL HIGH (ref 0.61–1.24)
GFR, Estimated: 18 mL/min — ABNORMAL LOW (ref >60)
Glucose, Bld: 412 mg/dL — ABNORMAL HIGH (ref 70–99)
Potassium: 4.6 mmol/L (ref 3.5–5.1)
Sodium: 137 mmol/L (ref 135–145)

## 2020-09-17 MED ORDER — INSULIN ASPART 100 UNIT/ML IJ SOLN
8.0000 [IU] | Freq: Once | INTRAMUSCULAR | Status: AC
  Administered 2020-09-17: 8 [IU] via SUBCUTANEOUS
  Filled 2020-09-17: qty 0.08

## 2020-09-17 NOTE — ED Notes (Signed)
Patient assisted by security to lobby. Patient stating he does not want his discharge papers

## 2020-09-17 NOTE — ED Notes (Signed)
Patient still lying in bed, eyes closed and refused to speak with nurse although making purposeful movements. Security called to assist patient with discharge.

## 2020-09-17 NOTE — ED Notes (Signed)
Upon attempting to discharge patient, patient refusing to look at or speak to this nurse. Went over paperwork with patient and he verbalized understanding, but was upset that we "havent done anything" about his legs. Discussed with patient importance of taking his medications in order to decrease his leg swelling. Patient states, "I took them for a month and they didn't do anything so I stopped." Patient educated on importance of taking maintenance medications, to which patient refused to respond to this nurse, laying in bed with his eyes closed. Told patient that this writer would give him a few minutes and then would walk him out.

## 2020-09-17 NOTE — ED Triage Notes (Signed)
Patient arrives via EMS with complaints of leg edema. Patient seen and evaluated for same 4/27 and 4/28. Patient states has not had his medications in six days

## 2020-09-17 NOTE — ED Provider Notes (Signed)
[facility] DEPT Provider Note  CSN: [id number] Arrival date & time: 09/17/20 0041  Chief Complaint(s) Leg Swelling  HPI [name] is a 50 y.o. male with a past medical history listed below including hypertension, CKD, CHF with a last EF both 40 to 45%'s currently on diuretics presents to the emergency department for evaluation of bilateral lower extremity edema.  Patient has been seen frequently in the ED for similar presentations.  Last seen several days ago for the same.  Noted improved renal function.  Patient reports that the edema got worse yesterday or today.  Denies any other symptoms including shortness of breath and chest pain.  He denies any falls or trauma.  Denies any other physical complaints.  Patient reports that he is compliant with his medications.  HPI  Past Medical History Past Medical History:  Diagnosis Date  . Anxiety 02/2019  . Anxiety 10/2019  . Diabetes mellitus type II, uncontrolled ([place]) 07/17/2006  . Elevated alkaline phosphatase level 01/2019  . ESSENTIAL HYPERTENSION 07/17/2006  . HYPERLIPIDEMIA 11/24/2008  . Insomnia 02/2019  . Left bundle branch block 02/22/2019  . Lesion of lip 10/2019  . Pneumonia 10/22/2011  . Suicidal ideations 02/2019  . Vitamin D deficiency 01/2019   Patient Active Problem List   Diagnosis Date Noted  . Lower extremity edema 03/07/2020  . CKD (chronic kidney disease) stage 3, GFR 30-59 ml/min ([facility]) 02/22/2020  . Left ventricular dysfunction 04/27/2019  . Hemoglobin A1C greater than 9%, indicating poor diabetic control 07/29/2018  . New onset left bundle branch block (LBBB)   . 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 Hyperlipidemia LDL goal <70 11/24/2008  . Uncontrolled type 2 diabetes mellitus with peripheral neuropathy ([place]) 07/17/2006  . Essential hypertension 07/17/2006   Home Medication(s) Prior to Admission medications   Medication Sig Start Date End Date Taking?  Authorizing Provider  amLODipine (NORVASC) 10 MG tablet Take 1 tablet (10 mg total) by mouth daily. 02/22/20 02/11/22  [name], MD  atorvastatin (LIPITOR) 40 MG tablet TAKE 1 TABLET (40 MG TOTAL) BY MOUTH DAILY. Patient not taking: Reported on 08/11/2020 02/23/20 02/22/21  [name], MD  Blood Glucose Monitoring Suppl (TRUE METRIX GO GLUCOSE METER) w/Device KIT USE TO MEASURE BLOOD SUGAR TWICE A DAY 02/22/20 02/21/21  [name], MD  Blood Glucose Monitoring Suppl (TRUE METRIX METER) w/Device KIT Use to measure blood sugar twice a day Patient not taking: Reported on 08/11/2020 02/22/20   [name], MD  carvedilol (COREG) 25 MG tablet TAKE 1 TABLET (25 MG TOTAL) BY MOUTH 2 (TWO) TIMES DAILY. 03/07/20 03/07/21  Lorretta Harp, MD  Continuous Blood Gluc Receiver (FREESTYLE LIBRE 14 DAY READER) DEVI 1 EACH BY DOES NOT APPLY ROUTE 4 (FOUR) TIMES DAILY - BEFORE MEALS AND AT BEDTIME. 08/11/20 08/11/21  [name], FNP  Continuous Blood Gluc Sensor (FREESTYLE LIBRE 14 DAY SENSOR) MISC 1 each by Does not apply route 4 (four) times daily -  before meals and at bedtime. 08/11/20   [name], FNP  furosemide (LASIX) 40 MG tablet Take 1 tablet (40 mg total) by mouth daily. Take for 4 days then stop 08/10/20   Montine Circle, PA-C  furosemide (LASIX) 40 MG tablet TAKE 1 TABLET BY MOUTH DAILY AND AS DIRECTED 07/03/20 07/03/21  Claudia Desanctis, MD  glucose blood test strip Use to check fasting blood sugar once daily. 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 Insulin dependent Patient not taking: Reported on  08/11/2020 02/22/20   [name], MD  hydrALAZINE (APRESOLINE) 25 MG tablet TAKE 1 TABLET (25 MG TOTAL) BY MOUTH 3 (THREE) TIMES DAILY. Patient not taking: Reported on 08/11/2020 03/07/20 03/07/21  Lorretta Harp, MD  insulin aspart (NOVOLOG FLEXPEN) 100 UNIT/ML FlexPen Inject 10 Units into the skin 2 (two) times daily with a meal. Patient not taking: Reported on 08/11/2020 02/23/20   [name], MD  insulin detemir (LEVEMIR FLEXTOUCH) 100 UNIT/ML FlexPen Inject 35 Units into the skin daily. Patient not taking: Reported on 08/11/2020 02/22/20   [name], MD  Insulin Pen Needle (PEN NEEDLES 29GX1/2") 29G X 12MM MISC Use with insulin pens Patient not taking: Reported on 08/11/2020 02/22/20   [name], MD  potassium chloride SA (KLOR-CON) 20 MEQ tablet TAKE 1 TABLET (20 MEQ TOTAL) BY MOUTH DAILY. TAKE FOR 4 DAYS THEN STOP Patient not taking: Reported on 08/11/2020 06/29/20 06/29/21  Deberah Pelton, NP  sildenafil (VIAGRA) 25 MG tablet Take 1 tablet (25 mg total) by mouth as needed for erectile dysfunction. for erectile dysfunction. Patient not taking: Reported on 08/11/2020 10/13/19   [name], FNP  sitaGLIPtin (JANUVIA) 25 MG tablet Take 1 tablet (25 mg total) by mouth daily. Patient not taking: Reported on 08/11/2020 02/22/20   [name], MD  TRUEplus Lancets 28G MISC USE TO MEASURE BLOOD SUGAR TWICE A DAY Patient not taking: Reported on 08/11/2020 02/22/20 02/21/21  [name], MD  Vitamin D, Ergocalciferol, (DRISDOL) 1.25 MG (50000 UNIT) CAPS capsule Take 50,000 Units by mouth once a week. Patient not taking: Reported on 08/11/2020 01/26/20   [provider]  Vitamin D, Ergocalciferol, (DRISDOL) 1.25 MG (50000 UNIT) CAPS capsule TAKE 1 CAPSULE BY MOUTH ONCE A WEEK 07/07/20 07/07/21  Claudia Desanctis, MD  gabapentin (NEURONTIN) 300 MG capsule Take 1 capsule (300 mg total) by mouth 3 (three) times daily. Patient not taking: Reported on 07/27/2019 07/29/18 08/30/19  [name], FNP                                                                                                                                    Past Surgical History Past Surgical History:  Procedure Laterality Date  . ARM SURGERY     Family History Family History  Problem Relation Age of Onset  . Hypertension Mother 66  . Cancer Mother        bladder cancer  . Alcohol  abuse Father   . Hypertension Brother     Social History Social History   Tobacco Use  . Smoking status: Never Smoker  . Smokeless tobacco: Never Used  Vaping Use  . Vaping Use: Never used  Substance Use Topics  . Alcohol use: No    Alcohol/week: 0.0 standard drinks  . Drug use: No   Allergies Sulfa antibiotics, Bactrim [sulfamethoxazole-trimethoprim], Cephalexin, and Other  Review of Systems Review  of Systems All other systems are reviewed and are negative for acute change except as noted in the HPI  Physical Exam Vital Signs  I have reviewed the triage vital signs BP 119/71   Pulse 92   Temp 99.1 F (37.3 C)   Resp 15   SpO2 97%   Physical Exam Vitals reviewed.  Constitutional:      General: He is not in acute distress.    Appearance: He is well-developed. He is not diaphoretic.  HENT:     Head: Normocephalic and atraumatic.     Jaw: No trismus.     Right Ear: External ear normal.     Left Ear: External ear normal.     Nose: Nose normal.  Eyes:     General: No scleral icterus.    Conjunctiva/sclera: Conjunctivae normal.  Neck:     Trachea: Phonation normal.  Cardiovascular:     Rate and Rhythm: Normal rate and regular rhythm.  Pulmonary:     Effort: Pulmonary effort is normal. No respiratory distress.     Breath sounds: No stridor.  Abdominal:     General: There is no distension.  Musculoskeletal:        General: Normal range of motion.     Cervical back: Normal range of motion.     Right lower leg: 2+ Pitting Edema present.     Left lower leg: 2+ Pitting Edema present.  Neurological:     Mental Status: He is alert and oriented to person, place, and time.  Psychiatric:        Behavior: Behavior normal.     ED Results and Treatments Labs (all labs ordered are listed, but only abnormal results are displayed) Labs Reviewed  CBC WITH DIFFERENTIAL/PLATELET - Abnormal; Notable for the following components:      Result Value   RBC 3.79 (*)     Hemoglobin 10.3 (*)    HCT 32.4 (*)    All other components within normal limits  BASIC METABOLIC PANEL - Abnormal; Notable for the following components:   Glucose, Bld 412 (*)    BUN 47 (*)    Creatinine, Ser 3.98 (*)    Calcium 7.0 (*)    GFR, Estimated 18 (*)    All other components within normal limits                                                                                                                         EKG  EKG Interpretation  Date/Time:    Ventricular Rate:    PR Interval:    QRS Duration:   QT Interval:    QTC Calculation:   R Axis:     Text Interpretation:        Radiology No results found.  Pertinent labs & imaging results that were available during my care of the patient were reviewed by me and considered in my medical decision making (see chart for details).  Medications Ordered in ED Medications  insulin aspart (novoLOG) injection 8 Units (8 Units Subcutaneous Given 09/17/20 0246)                                                                                                                                    Procedures Procedures  (including critical care time)  Medical Decision Making / ED Course I have reviewed the nursing notes for this encounter and the patient's prior records (if available in EHR or on provided paperwork).   [name] was evaluated in Emergency Department on 09/17/2020 for the symptoms described in the history of present illness. He was evaluated in the context of the global COVID-19 pandemic, which necessitated consideration that the patient might be at risk for infection with the SARS-CoV-2 virus that causes COVID-19. Institutional protocols and algorithms that pertain to the evaluation of patients at risk for COVID-19 are in a state of rapid change based on information released by regulatory bodies including the CDC and federal and state organizations. These policies and algorithms were followed during the patient's  care in the ED.  Bilateral lower extremity edema.  2+.  When compared to previous exams, appears to be the same. We will obtain screening labs to check renal function.  Renal function improving. Labs notable for hyperglycemia without evidence of DKA.  Given a single dose of subcu insulin. Recommend he follow-up with his primary care provider or cardiologist for continued management of his peripheral edema.      Final Clinical Impression(s) / ED Diagnoses Final diagnoses:  Bilateral lower extremity edema  Hyperglycemia    The patient appears reasonably screened and/or stabilized for discharge and I doubt any other medical condition or other [facility] requiring further screening, evaluation, or treatment in the ED at this time prior to discharge. Safe for discharge with strict return precautions.  Disposition: Discharge  Condition: Good  I have discussed the results, Dx and Tx plan with the patient/family who expressed understanding and agree(s) with the plan. Discharge instructions discussed at length. The patient/family was given strict return precautions who verbalized understanding of the instructions. No further questions at time of discharge.    ED Discharge Orders    None       Follow Up: [name], [place] [address] [phone]  Call  to schedule an appointment for close follow up  Lorretta Harp, [address] [place]  [phone]  Call  if you have not been called about your appointment     This chart was dictated using voice recognition software.  Despite best efforts to proofread,  errors can occur which can change the documentation meaning.   Fatima Blank, MD 09/17/20 [phone]

## 2020-09-18 ENCOUNTER — Other Ambulatory Visit: Payer: Self-pay

## 2020-09-29 ENCOUNTER — Other Ambulatory Visit: Payer: Self-pay

## 2020-09-29 ENCOUNTER — Emergency Department (HOSPITAL_COMMUNITY)
Admission: EM | Admit: 2020-09-29 | Discharge: 2020-09-29 | Disposition: A | Discharge status: Home / Self Care | Payer: Self-pay | Attending: Student | Admitting: Student

## 2020-09-29 DIAGNOSIS — I10 Essential (primary) hypertension: Secondary | ICD-10-CM | POA: Insufficient documentation

## 2020-09-29 DIAGNOSIS — R3589 Other polyuria: Secondary | ICD-10-CM | POA: Insufficient documentation

## 2020-09-29 DIAGNOSIS — Z5321 Procedure and treatment not carried out due to patient leaving prior to being seen by health care provider: Secondary | ICD-10-CM | POA: Insufficient documentation

## 2020-09-29 LAB — CBG MONITORING, ED: Glucose-Capillary: 230 mg/dL — ABNORMAL HIGH (ref 70–99)

## 2020-09-29 NOTE — ED Notes (Signed)
Called patient to be bought back to a hall way bed patient didn't answer

## 2020-09-29 NOTE — ED Provider Notes (Signed)
  Emergency Medicine Provider in Triage Note   MSE was initiated and I personally evaluated the patient  1:16 AM on Sep 29, 2020 as provider in triage.   Chief Complaint: diabetes & blood pressure check  HPI  Patient is a 50 y.o. who presents to the ED with concern for his diabetes & blood pressure. States that he is worried about each of these things. Has had some increased thirst/uriantion at times, otherwise asymptomatic.    Review of Systems  Positive: Polyuria, Polydipsia Negative: Chest pain, dyspnea, headache, numbness, or weakness  Physical Exam  BP (!) 170/107 (BP Location: Left Arm)   Pulse 83   Temp 98.9 F (37.2 C) (Oral)   Resp 16   SpO2 99%    Gen:   Awake, no distress   HEENT:  Atraumatic  Resp:  Normal effort  Cardiac:  Normal rate  Abd:   Nondistended, nontender  MSK:   Moves extremities without difficulty  Neuro:  Speech clear, 5/5 strength w/ grip strength & plantar/dorsiflexion bilaterally. Sensation grossly intact x 4.   Medical Decision Making   Initiation of care has begun. The patient has been counseled on the process, plan, and necessity for staying for the completion/evaluation, informed that the remainder of the evaluation will be completed by another provider, this initial triage assessment does not replace that evaluation, and the importance of remaining in the ED until their evaluation is complete.  Clinical Impression  Hypertension         Amaryllis Dyke, PA-C 09/29/20 0117    Fatima Blank, MD 10/02/20 2109

## 2020-09-29 NOTE — ED Triage Notes (Signed)
Pt states he feels like his blood pressure is up and when he checked his blood sugar at home it was in 300s. Pt denies any pain, shortness of breath or nausea.

## 2020-09-29 NOTE — ED Notes (Signed)
Pt did not respond for updated vitals

## 2020-09-30 ENCOUNTER — Encounter (HOSPITAL_COMMUNITY): Payer: Self-pay | Admitting: Emergency Medicine

## 2020-09-30 ENCOUNTER — Emergency Department (HOSPITAL_COMMUNITY)
Admission: EM | Admit: 2020-09-30 | Discharge: 2020-10-01 | Disposition: A | Discharge status: Home / Self Care | Payer: Self-pay | Attending: Emergency Medicine | Admitting: Emergency Medicine

## 2020-09-30 ENCOUNTER — Other Ambulatory Visit: Payer: Self-pay

## 2020-09-30 DIAGNOSIS — R0609 Other forms of dyspnea: Secondary | ICD-10-CM | POA: Insufficient documentation

## 2020-09-30 DIAGNOSIS — I129 Hypertensive chronic kidney disease with stage 1 through stage 4 chronic kidney disease, or unspecified chronic kidney disease: Secondary | ICD-10-CM | POA: Insufficient documentation

## 2020-09-30 DIAGNOSIS — E1122 Type 2 diabetes mellitus with diabetic chronic kidney disease: Secondary | ICD-10-CM | POA: Insufficient documentation

## 2020-09-30 DIAGNOSIS — R609 Edema, unspecified: Secondary | ICD-10-CM | POA: Insufficient documentation

## 2020-09-30 DIAGNOSIS — R0600 Dyspnea, unspecified: Secondary | ICD-10-CM

## 2020-09-30 DIAGNOSIS — Z794 Long term (current) use of insulin: Secondary | ICD-10-CM | POA: Insufficient documentation

## 2020-09-30 DIAGNOSIS — Z79899 Other long term (current) drug therapy: Secondary | ICD-10-CM | POA: Insufficient documentation

## 2020-09-30 DIAGNOSIS — N183 Chronic kidney disease, stage 3 unspecified: Secondary | ICD-10-CM | POA: Insufficient documentation

## 2020-09-30 NOTE — ED Triage Notes (Signed)
Pt reports bilateral feet and lower leg swelling worsening over the last few days. Reports that they feel tight. States that he stands and walks a lot. No injury. States that he has been on a water pill in the past.

## 2020-10-01 ENCOUNTER — Emergency Department (HOSPITAL_COMMUNITY): Payer: Self-pay

## 2020-10-01 LAB — BASIC METABOLIC PANEL
Anion gap: 5 (ref 5–15)
BUN: 43 mg/dL — ABNORMAL HIGH (ref 6–20)
CO2: 27 mmol/L (ref 22–32)
Calcium: 7.4 mg/dL — ABNORMAL LOW (ref 8.9–10.3)
Chloride: 103 mmol/L (ref 98–111)
Creatinine, Ser: 3.81 mg/dL — ABNORMAL HIGH (ref 0.61–1.24)
GFR, Estimated: 19 mL/min — ABNORMAL LOW (ref >60)
Glucose, Bld: 195 mg/dL — ABNORMAL HIGH (ref 70–99)
Potassium: 3.9 mmol/L (ref 3.5–5.1)
Sodium: 135 mmol/L (ref 135–145)

## 2020-10-01 LAB — CBC WITH DIFFERENTIAL/PLATELET
Abs Immature Granulocytes: 0.02 10*3/uL (ref 0.00–0.07)
Basophils Absolute: 0 10*3/uL (ref 0.0–0.1)
Basophils Relative: 1 %
Eosinophils Absolute: 0.3 10*3/uL (ref 0.0–0.5)
Eosinophils Relative: 4 %
HCT: 31.9 % — ABNORMAL LOW (ref 39.0–52.0)
Hemoglobin: 10.1 g/dL — ABNORMAL LOW (ref 13.0–17.0)
Immature Granulocytes: 0 %
Lymphocytes Relative: 27 %
Lymphs Abs: 2 10*3/uL (ref 0.7–4.0)
MCH: 27.4 pg (ref 26.0–34.0)
MCHC: 31.7 g/dL (ref 30.0–36.0)
MCV: 86.4 fL (ref 80.0–100.0)
Monocytes Absolute: 0.8 10*3/uL (ref 0.1–1.0)
Monocytes Relative: 10 %
Neutro Abs: 4.4 10*3/uL (ref 1.7–7.7)
Neutrophils Relative %: 58 %
Platelets: 349 10*3/uL (ref 150–400)
RBC: 3.69 MIL/uL — ABNORMAL LOW (ref 4.22–5.81)
RDW: 12.9 % (ref 11.5–15.5)
WBC: 7.5 10*3/uL (ref 4.0–10.5)
nRBC: 0 % (ref 0.0–0.2)

## 2020-10-01 LAB — I-STAT CHEM 8, ED
BUN: 39 mg/dL — ABNORMAL HIGH (ref 6–20)
Calcium, Ion: 0.99 mmol/L — ABNORMAL LOW (ref 1.15–1.40)
Chloride: 100 mmol/L (ref 98–111)
Creatinine, Ser: 3.8 mg/dL — ABNORMAL HIGH (ref 0.61–1.24)
Glucose, Bld: 182 mg/dL — ABNORMAL HIGH (ref 70–99)
HCT: 29 % — ABNORMAL LOW (ref 39.0–52.0)
Hemoglobin: 9.9 g/dL — ABNORMAL LOW (ref 13.0–17.0)
Potassium: 4 mmol/L (ref 3.5–5.1)
Sodium: 136 mmol/L (ref 135–145)
TCO2: 26 mmol/L (ref 22–32)

## 2020-10-01 MED ORDER — FUROSEMIDE 10 MG/ML IJ SOLN
40.0000 mg | Freq: Once | INTRAMUSCULAR | Status: AC
  Administered 2020-10-01: 40 mg via INTRAVENOUS
  Filled 2020-10-01: qty 4

## 2020-10-01 NOTE — ED Notes (Signed)
Pt ambulated in room. 02 maintained 98/99% on room air

## 2020-10-01 NOTE — ED Provider Notes (Signed)
[place] DEPT Provider Note  CSN: [id number] Arrival date & time: 09/30/20 2256  Chief Complaint(s) Foot Swelling  HPI [name] is a 50 y.o. male with a past medical history listed below including hypertension, CKD, CHF with a last EF of 40 to 45% currently on diuretics who presents to the ED for bilateral lower extremity edema.  Patient seen previously for the same.  I last saw him 14 days ago for the same.  He reports that the swelling has been getting worse over the past 2 days.  Reports he is compliant with his medication.  Also endorsing dyspnea on exertion.  No chest pain.  No coughing or congestion.  Denies any other physical complaints.   HPI  Past Medical History Past Medical History:  Diagnosis Date  . Anxiety 02/2019  . Anxiety 10/2019  . Diabetes mellitus type II, uncontrolled ([place]) 07/17/2006  . Elevated alkaline phosphatase level 01/2019  . ESSENTIAL HYPERTENSION 07/17/2006  . HYPERLIPIDEMIA 11/24/2008  . Insomnia 02/2019  . Left bundle branch block 02/22/2019  . Lesion of lip 10/2019  . Pneumonia 10/22/2011  . Suicidal ideations 02/2019  . Vitamin D deficiency 01/2019   Patient Active Problem List   Diagnosis Date Noted  . Lower extremity edema 03/07/2020  . CKD (chronic kidney disease) stage 3, GFR 30-59 ml/min ([facility]) 02/22/2020  . Left ventricular dysfunction 04/27/2019  . Hemoglobin A1C greater than 9%, indicating poor diabetic control 07/29/2018  . New onset left bundle branch block (LBBB)   . 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 Hyperlipidemia LDL goal <70 11/24/2008  . Uncontrolled type 2 diabetes mellitus with peripheral neuropathy ([place]) 07/17/2006  . Essential hypertension 07/17/2006   Home Medication(s) Prior to Admission medications   Medication Sig Start Date End Date Taking? Authorizing Provider  amLODipine (NORVASC) 10 MG tablet Take 1 tablet (10 mg total) by mouth daily. 02/22/20 02/11/22   [name], MD  atorvastatin (LIPITOR) 40 MG tablet TAKE 1 TABLET (40 MG TOTAL) BY MOUTH DAILY. Patient not taking: Reported on 08/11/2020 02/23/20 02/22/21  [name], MD  Blood Glucose Monitoring Suppl (TRUE METRIX GO GLUCOSE METER) w/Device KIT USE TO MEASURE BLOOD SUGAR TWICE A DAY 02/22/20 02/21/21  [name], MD  Blood Glucose Monitoring Suppl (TRUE METRIX METER) w/Device KIT Use to measure blood sugar twice a day Patient not taking: Reported on 08/11/2020 02/22/20   [name], MD  carvedilol (COREG) 25 MG tablet TAKE 1 TABLET (25 MG TOTAL) BY MOUTH 2 (TWO) TIMES DAILY. 03/07/20 03/07/21  Lorretta Harp, MD  Continuous Blood Gluc Receiver (FREESTYLE LIBRE 14 DAY READER) DEVI 1 EACH BY DOES NOT APPLY ROUTE 4 (FOUR) TIMES DAILY - BEFORE MEALS AND AT BEDTIME. 08/11/20 08/11/21  [name], FNP  Continuous Blood Gluc Sensor (FREESTYLE LIBRE 14 DAY SENSOR) MISC 1 each by Does not apply route 4 (four) times daily -  before meals and at bedtime. 08/11/20   [name], FNP  furosemide (LASIX) 40 MG tablet Take 1 tablet (40 mg total) by mouth daily. Take for 4 days then stop 08/10/20   Montine Circle, PA-C  furosemide (LASIX) 40 MG tablet TAKE 1 TABLET BY MOUTH DAILY AND AS DIRECTED 07/03/20 07/03/21  Claudia Desanctis, MD  glucose blood test strip Use to check fasting blood sugar once daily. 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 Insulin dependent Patient not taking: Reported on 08/11/2020 02/22/20   [name], MD  hydrALAZINE (APRESOLINE)  25 MG tablet TAKE 1 TABLET (25 MG TOTAL) BY MOUTH 3 (THREE) TIMES DAILY. Patient not taking: Reported on 08/11/2020 03/07/20 03/07/21  Lorretta Harp, MD  insulin aspart (NOVOLOG FLEXPEN) 100 UNIT/ML FlexPen Inject 10 Units into the skin 2 (two) times daily with a meal. Patient not taking: Reported on 08/11/2020 02/23/20   [name], MD  insulin detemir (LEVEMIR FLEXTOUCH) 100 UNIT/ML FlexPen Inject 35 Units into the skin daily. Patient  not taking: Reported on 08/11/2020 02/22/20   [name], MD  Insulin Pen Needle (PEN NEEDLES 29GX1/2") 29G X 12MM MISC Use with insulin pens Patient not taking: Reported on 08/11/2020 02/22/20   [name], MD  potassium chloride SA (KLOR-CON) 20 MEQ tablet TAKE 1 TABLET (20 MEQ TOTAL) BY MOUTH DAILY. TAKE FOR 4 DAYS THEN STOP Patient not taking: Reported on 08/11/2020 06/29/20 06/29/21  Deberah Pelton, NP  sildenafil (VIAGRA) 25 MG tablet Take 1 tablet (25 mg total) by mouth as needed for erectile dysfunction. for erectile dysfunction. Patient not taking: Reported on 08/11/2020 10/13/19   [name], FNP  sitaGLIPtin (JANUVIA) 25 MG tablet Take 1 tablet (25 mg total) by mouth daily. Patient not taking: Reported on 08/11/2020 02/22/20   [name], MD  TRUEplus Lancets 28G MISC USE TO MEASURE BLOOD SUGAR TWICE A DAY Patient not taking: Reported on 08/11/2020 02/22/20 02/21/21  [name], MD  Vitamin D, Ergocalciferol, (DRISDOL) 1.25 MG (50000 UNIT) CAPS capsule Take 50,000 Units by mouth once a week. Patient not taking: Reported on 08/11/2020 01/26/20   [provider]  Vitamin D, Ergocalciferol, (DRISDOL) 1.25 MG (50000 UNIT) CAPS capsule TAKE 1 CAPSULE BY MOUTH ONCE A WEEK 07/07/20 07/07/21  Claudia Desanctis, MD  gabapentin (NEURONTIN) 300 MG capsule Take 1 capsule (300 mg total) by mouth 3 (three) times daily. Patient not taking: Reported on 07/27/2019 07/29/18 08/30/19  [name], FNP                                                                                                                                    Past Surgical History Past Surgical History:  Procedure Laterality Date  . ARM SURGERY     Family History Family History  Problem Relation Age of Onset  . Hypertension Mother 1  . Cancer Mother        bladder cancer  . Alcohol abuse Father   . Hypertension Brother     Social History Social History   Tobacco Use  . Smoking status: Never  Smoker  . Smokeless tobacco: Never Used  Vaping Use  . Vaping Use: Never used  Substance Use Topics  . Alcohol use: No    Alcohol/week: 0.0 standard drinks  . Drug use: No   Allergies Sulfa antibiotics, Bactrim [sulfamethoxazole-trimethoprim], Cephalexin, and Other  Review of Systems Review of Systems All other systems are reviewed and are negative for  acute change except as noted in the HPI  Physical Exam Vital Signs  I have reviewed the triage vital signs BP (!) 165/94   Pulse 98   Temp 98.4 F (36.9 C) (Oral)   Resp 18   Ht 5' 7"  (1.702 m)   Wt 68 kg   SpO2 100%   BMI 23.49 kg/m   Physical Exam Vitals reviewed.  Constitutional:      General: He is not in acute distress.    Appearance: He is well-developed. He is not diaphoretic.  HENT:     Head: Normocephalic and atraumatic.     Nose: Nose normal.  Eyes:     General: No scleral icterus.       Right eye: No discharge.        Left eye: No discharge.     Conjunctiva/sclera: Conjunctivae normal.     Pupils: Pupils are equal, round, and reactive to light.  Cardiovascular:     Rate and Rhythm: Normal rate and regular rhythm.     Heart sounds: No murmur heard. No friction rub. No gallop.   Pulmonary:     Effort: Pulmonary effort is normal. No respiratory distress.     Breath sounds: Normal breath sounds. No stridor. No rales.  Abdominal:     General: There is no distension.     Palpations: Abdomen is soft.     Tenderness: There is no abdominal tenderness.  Musculoskeletal:        General: No tenderness.     Cervical back: Normal range of motion and neck supple.     Right lower leg: 2+ Pitting Edema present.     Left lower leg: 2+ Pitting Edema present.     Comments: Similar to my last encounter with him  Skin:    General: Skin is warm and dry.     Findings: No erythema or rash.  Neurological:     Mental Status: He is alert and oriented to person, place, and time.     ED Results and  Treatments Labs (all labs ordered are listed, but only abnormal results are displayed) Labs Reviewed  CBC WITH DIFFERENTIAL/PLATELET - Abnormal; Notable for the following components:      Result Value   RBC 3.69 (*)    Hemoglobin 10.1 (*)    HCT 31.9 (*)    All other components within normal limits  BASIC METABOLIC PANEL - Abnormal; Notable for the following components:   Glucose, Bld 195 (*)    BUN 43 (*)    Creatinine, Ser 3.81 (*)    Calcium 7.4 (*)    GFR, Estimated 19 (*)    All other components within normal limits  I-STAT CHEM 8, ED - Abnormal; Notable for the following components:   BUN 39 (*)    Creatinine, Ser 3.80 (*)    Glucose, Bld 182 (*)    Calcium, Ion 0.99 (*)    Hemoglobin 9.9 (*)    HCT 29.0 (*)    All other components within normal limits  EKG  EKG Interpretation  Date/Time:  Sunday Oct 01 2020 00:21:33 EDT Ventricular Rate:  94 PR Interval:  145 QRS Duration: 144 QT Interval:  433 QTC Calculation: 542 R Axis:   134 Text Interpretation: Sinus rhythm Left bundle branch block No acute changes Confirmed by Addison Lank [id number]) on 10/01/2020 12:33:35 AM      Radiology DG Chest Port 1 View  Result Date: 10/01/2020 CLINICAL DATA:  Dyspnea on exertion. EXAM: PORTABLE CHEST 1 VIEW COMPARISON:  08/23/2020 FINDINGS: Similar cardiomegaly. Vascular congestion without pulmonary edema. No focal airspace disease. No pleural fluid. No pneumothorax. No acute osseous abnormalities are seen. IMPRESSION: Similar cardiomegaly with vascular congestion. Electronically Signed   By: Keith Rake M.D.   On: 10/01/2020 00:26    Pertinent labs & imaging results that were available during my care of the patient were reviewed by me and considered in my medical decision making (see chart for details).  Medications Ordered in ED Medications  furosemide  (LASIX) injection 40 mg (40 mg Intravenous Given 10/01/20 0147)                                                                                                                                    Procedures .1-3 Lead EKG Interpretation Performed by: Fatima Blank, MD Authorized by: Fatima Blank, MD     Interpretation: normal     ECG rate:  72   ECG rate assessment: normal     Rhythm: sinus rhythm     Ectopy: none     Conduction: normal      (including critical care time)  Medical Decision Making / ED Course I have reviewed the nursing notes for this encounter and the patient's prior records (if available in EHR or on provided paperwork).   [name] was evaluated in Emergency Department on 10/01/2020 for the symptoms described in the history of present illness. He was evaluated in the context of the global COVID-19 pandemic, which necessitated consideration that the patient might be at risk for infection with the SARS-CoV-2 virus that causes COVID-19. Institutional protocols and algorithms that pertain to the evaluation of patients at risk for COVID-19 are in a state of rapid change based on information released by regulatory bodies including the CDC and federal and state organizations. These policies and algorithms were followed during the patient's care in the ED.  Bilateral lower extremity edema appear to be similar to my last encounter. Patient is complaining of dyspnea on exertion. EKG without acute ischemic changes or evidence of pericarditis.. Chest x-ray without pulmonary edema or evidence suggestive of pneumonia, pneumothorax, pneumomediastinum.  No abnormal contour of the mediastinum to suggest dissection. No evidence of acute injuries. Renal function appears to be improving from my last encounter with him as well. Patient provided with IV dose of Lasix. Ambulated well on room air without increased work of breathing or desaturations. Recommend close  follow-up with  PCP/cardiology.        Final Clinical Impression(s) / ED Diagnoses Final diagnoses:  DOE (dyspnea on exertion)  Peripheral edema   The patient appears reasonably screened and/or stabilized for discharge and I doubt any other medical condition or other [facility] requiring further screening, evaluation, or treatment in the ED at this time prior to discharge. Safe for discharge with strict return precautions.  Disposition: Discharge  Condition: Good  I have discussed the results, Dx and Tx plan with the patient/family who expressed understanding and agree(s) with the plan. Discharge instructions discussed at length. The patient/family was given strict return precautions who verbalized understanding of the instructions. No further questions at time of discharge.    ED Discharge Orders    None        Follow Up: [name], [place]  [phone]  Call  to schedule an appointment for close follow up      This chart was dictated using voice recognition software.  Despite best efforts to proofread,  errors can occur which can change the documentation meaning.   Fatima Blank, MD 10/01/20 [phone]

## 2020-10-02 ENCOUNTER — Other Ambulatory Visit: Payer: Self-pay

## 2020-10-02 MED FILL — Furosemide Tab 40 MG: ORAL | 30 days supply | Qty: 40 | Fill #0 | Status: AC

## 2020-10-09 ENCOUNTER — Other Ambulatory Visit: Payer: Self-pay

## 2020-11-15 ENCOUNTER — Ambulatory Visit: Payer: Self-pay | Admitting: Nurse Practitioner

## 2020-11-16 ENCOUNTER — Telehealth: Payer: Self-pay

## 2020-11-17 ENCOUNTER — Ambulatory Visit: Payer: Self-pay

## 2020-11-17 NOTE — Telephone Encounter (Signed)
Reason for Disposition  Sounds like a life-threatening emergency to the triager  Answer Assessment - Initial Assessment Questions 1. ONSET: "When did the swelling start?" (e.g., minutes, hours, days)     3 months ago 2. LOCATION: "What part of the leg is swollen?"  "Are both legs swollen or just one leg?"     Both legs 3. SEVERITY: "How bad is the swelling?" (e.g., localized; mild, moderate, severe)  - Localized - small area of swelling localized to one leg  - MILD pedal edema - swelling limited to foot and ankle, pitting edema < 1/4 inch (6 mm) deep, rest and elevation eliminate most or all swelling  - MODERATE edema - swelling of lower leg to knee, pitting edema > 1/4 inch (6 mm) deep, rest and elevation only partially reduce swelling  - SEVERE edema - swelling extends above knee, facial or hand swelling present      Knees 4. REDNESS: "Does the swelling look red or infected?"     No 5. PAIN: "Is the swelling painful to touch?" If Yes, ask: "How painful is it?"   (Scale 1-10; mild, moderate or severe)     With walking 6. FEVER: "Do you have a fever?" If Yes, ask: "What is it, how was it measured, and when did it start?"      No 7. CAUSE: "What do you think is causing the leg swelling?"     My heart 8. MEDICAL HISTORY: "Do you have a history of heart failure, kidney disease, liver failure, or cancer?"     HTN 9. RECURRENT SYMPTOM: "Have you had leg swelling before?" If Yes, ask: "When was the last time?" "What happened that time?"     Yes 10. OTHER SYMPTOMS: "Do you have any other symptoms?" (e.g., chest pain, difficulty breathing)       Shortness of breath 11. PREGNANCY: "Is there any chance you are pregnant?" "When was your last menstrual period?"       No  Protocols used: Leg Swelling and Edema-A-AH

## 2020-11-17 NOTE — Telephone Encounter (Signed)
Pt. And mother report pt. Has swelling to both legs up to his knees, shortness of breath, BP 207/132, blurred vision. Instructed to call 911. Verbalizes understanding. Requesting to establish care with Dr. Joya Gaskins. Mom, Julez Zong, states "Dr. Joya Gaskins sees my whole family and he will take him as a pt." Please advise.

## 2020-11-27 ENCOUNTER — Inpatient Hospital Stay (HOSPITAL_COMMUNITY): Payer: Self-pay

## 2020-11-27 ENCOUNTER — Other Ambulatory Visit: Payer: Self-pay

## 2020-11-27 ENCOUNTER — Inpatient Hospital Stay (HOSPITAL_COMMUNITY)
Admission: EM | Admit: 2020-11-27 | Discharge: 2020-11-29 | DRG: 291 | Disposition: A | Discharge status: Home / Self Care | Payer: Self-pay | Attending: Internal Medicine | Admitting: Internal Medicine

## 2020-11-27 ENCOUNTER — Emergency Department (HOSPITAL_COMMUNITY): Payer: Self-pay

## 2020-11-27 DIAGNOSIS — B353 Tinea pedis: Secondary | ICD-10-CM | POA: Diagnosis present

## 2020-11-27 DIAGNOSIS — E1122 Type 2 diabetes mellitus with diabetic chronic kidney disease: Secondary | ICD-10-CM | POA: Diagnosis present

## 2020-11-27 DIAGNOSIS — T465X6A Underdosing of other antihypertensive drugs, initial encounter: Secondary | ICD-10-CM | POA: Diagnosis present

## 2020-11-27 DIAGNOSIS — IMO0002 Reserved for concepts with insufficient information to code with codable children: Secondary | ICD-10-CM

## 2020-11-27 DIAGNOSIS — Z882 Allergy status to sulfonamides status: Secondary | ICD-10-CM

## 2020-11-27 DIAGNOSIS — Z20822 Contact with and (suspected) exposure to covid-19: Secondary | ICD-10-CM | POA: Diagnosis present

## 2020-11-27 DIAGNOSIS — N179 Acute kidney failure, unspecified: Secondary | ICD-10-CM

## 2020-11-27 DIAGNOSIS — Z560 Unemployment, unspecified: Secondary | ICD-10-CM

## 2020-11-27 DIAGNOSIS — I169 Hypertensive crisis, unspecified: Secondary | ICD-10-CM | POA: Diagnosis present

## 2020-11-27 DIAGNOSIS — E785 Hyperlipidemia, unspecified: Secondary | ICD-10-CM

## 2020-11-27 DIAGNOSIS — R7989 Other specified abnormal findings of blood chemistry: Secondary | ICD-10-CM

## 2020-11-27 DIAGNOSIS — E1165 Type 2 diabetes mellitus with hyperglycemia: Secondary | ICD-10-CM | POA: Diagnosis present

## 2020-11-27 DIAGNOSIS — R778 Other specified abnormalities of plasma proteins: Secondary | ICD-10-CM

## 2020-11-27 DIAGNOSIS — I130 Hypertensive heart and chronic kidney disease with heart failure and stage 1 through stage 4 chronic kidney disease, or unspecified chronic kidney disease: Principal | ICD-10-CM | POA: Diagnosis present

## 2020-11-27 DIAGNOSIS — Z811 Family history of alcohol abuse and dependence: Secondary | ICD-10-CM

## 2020-11-27 DIAGNOSIS — I5023 Acute on chronic systolic (congestive) heart failure: Secondary | ICD-10-CM

## 2020-11-27 DIAGNOSIS — Z9114 Patient's other noncompliance with medication regimen: Secondary | ICD-10-CM

## 2020-11-27 DIAGNOSIS — Z8052 Family history of malignant neoplasm of bladder: Secondary | ICD-10-CM

## 2020-11-27 DIAGNOSIS — N184 Chronic kidney disease, stage 4 (severe): Secondary | ICD-10-CM | POA: Diagnosis present

## 2020-11-27 DIAGNOSIS — I10 Essential (primary) hypertension: Secondary | ICD-10-CM

## 2020-11-27 DIAGNOSIS — E1142 Type 2 diabetes mellitus with diabetic polyneuropathy: Secondary | ICD-10-CM | POA: Diagnosis present

## 2020-11-27 DIAGNOSIS — J449 Chronic obstructive pulmonary disease, unspecified: Secondary | ICD-10-CM | POA: Diagnosis present

## 2020-11-27 DIAGNOSIS — I160 Hypertensive urgency: Secondary | ICD-10-CM | POA: Diagnosis present

## 2020-11-27 DIAGNOSIS — I5043 Acute on chronic combined systolic (congestive) and diastolic (congestive) heart failure: Secondary | ICD-10-CM

## 2020-11-27 DIAGNOSIS — I5041 Acute combined systolic (congestive) and diastolic (congestive) heart failure: Secondary | ICD-10-CM | POA: Diagnosis present

## 2020-11-27 DIAGNOSIS — Y92009 Unspecified place in unspecified non-institutional (private) residence as the place of occurrence of the external cause: Secondary | ICD-10-CM

## 2020-11-27 DIAGNOSIS — I313 Pericardial effusion (noninflammatory): Secondary | ICD-10-CM | POA: Diagnosis present

## 2020-11-27 DIAGNOSIS — Z79899 Other long term (current) drug therapy: Secondary | ICD-10-CM

## 2020-11-27 DIAGNOSIS — Z8249 Family history of ischemic heart disease and other diseases of the circulatory system: Secondary | ICD-10-CM

## 2020-11-27 DIAGNOSIS — Z881 Allergy status to other antibiotic agents status: Secondary | ICD-10-CM

## 2020-11-27 LAB — RAPID URINE DRUG SCREEN, HOSP PERFORMED
Amphetamines: NOT DETECTED
Barbiturates: NOT DETECTED
Benzodiazepines: NOT DETECTED
Cocaine: NOT DETECTED
Opiates: NOT DETECTED
Tetrahydrocannabinol: NOT DETECTED

## 2020-11-27 LAB — CBG MONITORING, ED
Glucose-Capillary: 153 mg/dL — ABNORMAL HIGH (ref 70–99)
Glucose-Capillary: 181 mg/dL — ABNORMAL HIGH (ref 70–99)
Glucose-Capillary: 129 mg/dL — ABNORMAL HIGH (ref 70–99)

## 2020-11-27 LAB — CBC
HCT: 34.9 % — ABNORMAL LOW (ref 39.0–52.0)
Hemoglobin: 10.8 g/dL — ABNORMAL LOW (ref 13.0–17.0)
MCH: 26.8 pg (ref 26.0–34.0)
MCHC: 30.9 g/dL (ref 30.0–36.0)
MCV: 86.6 fL (ref 80.0–100.0)
Platelets: 360 10*3/uL (ref 150–400)
RBC: 4.03 MIL/uL — ABNORMAL LOW (ref 4.22–5.81)
RDW: 13.2 % (ref 11.5–15.5)
WBC: 6.2 10*3/uL (ref 4.0–10.5)
nRBC: 0 % (ref 0.0–0.2)

## 2020-11-27 LAB — BASIC METABOLIC PANEL
Anion gap: 6 (ref 5–15)
BUN: 45 mg/dL — ABNORMAL HIGH (ref 6–20)
CO2: 24 mmol/L (ref 22–32)
Calcium: 7.3 mg/dL — ABNORMAL LOW (ref 8.9–10.3)
Chloride: 105 mmol/L (ref 98–111)
Creatinine, Ser: 4.29 mg/dL — ABNORMAL HIGH (ref 0.61–1.24)
GFR, Estimated: 16 mL/min — ABNORMAL LOW (ref >60)
Glucose, Bld: 212 mg/dL — ABNORMAL HIGH (ref 70–99)
Potassium: 4 mmol/L (ref 3.5–5.1)
Sodium: 135 mmol/L (ref 135–145)

## 2020-11-27 LAB — HIV ANTIBODY (ROUTINE TESTING W REFLEX): HIV Screen 4th Generation wRfx: NONREACTIVE

## 2020-11-27 LAB — BRAIN NATRIURETIC PEPTIDE: B Natriuretic Peptide: 1009.9 pg/mL — ABNORMAL HIGH (ref 0.0–100.0)

## 2020-11-27 LAB — TROPONIN I (HIGH SENSITIVITY)
Troponin I (High Sensitivity): 77 ng/L — ABNORMAL HIGH (ref <18)
Troponin I (High Sensitivity): 80 ng/L — ABNORMAL HIGH (ref <18)

## 2020-11-27 LAB — URINALYSIS, ROUTINE W REFLEX MICROSCOPIC
Bacteria, UA: NONE SEEN
Bilirubin Urine: NEGATIVE
Glucose, UA: >=500 mg/dL — AB
Hgb urine dipstick: NEGATIVE
Ketones, ur: NEGATIVE mg/dL
Leukocytes,Ua: NEGATIVE
Nitrite: NEGATIVE
Protein, ur: 100 mg/dL — AB
Specific Gravity, Urine: 1.011 (ref 1.005–1.030)
pH: 6 (ref 5.0–8.0)

## 2020-11-27 LAB — ECHOCARDIOGRAM COMPLETE
Area-P 1/2: 3.21 cm2
Height: 67 in
S' Lateral: 4.2 cm
Weight: 2320 oz

## 2020-11-27 LAB — HEMOGLOBIN A1C
Hgb A1c MFr Bld: 9.7 % — ABNORMAL HIGH (ref 4.8–5.6)
Mean Plasma Glucose: 231.69 mg/dL

## 2020-11-27 LAB — SARS CORONAVIRUS 2 (TAT 6-24 HRS): SARS Coronavirus 2: NEGATIVE

## 2020-11-27 MED ORDER — ATORVASTATIN CALCIUM 40 MG PO TABS
40.0000 mg | ORAL_TABLET | Freq: Every day | ORAL | Status: DC
  Administered 2020-11-27 – 2020-11-29 (×3): 40 mg via ORAL
  Filled 2020-11-27 (×3): qty 1

## 2020-11-27 MED ORDER — INSULIN DETEMIR 100 UNIT/ML ~~LOC~~ SOLN
35.0000 [IU] | Freq: Every day | SUBCUTANEOUS | Status: DC
  Administered 2020-11-28 – 2020-11-29 (×2): 35 [IU] via SUBCUTANEOUS
  Filled 2020-11-27 (×2): qty 0.35

## 2020-11-27 MED ORDER — ACETAMINOPHEN 500 MG PO TABS
1000.0000 mg | ORAL_TABLET | Freq: Once | ORAL | Status: AC
  Administered 2020-11-27: 1000 mg via ORAL
  Filled 2020-11-27: qty 2

## 2020-11-27 MED ORDER — ACETAMINOPHEN 650 MG RE SUPP: 650.0000 mg | Freq: Four times a day (QID) | RECTAL | Status: DC | PRN

## 2020-11-27 MED ORDER — ALUM & MAG HYDROXIDE-SIMETH 200-200-20 MG/5ML PO SUSP
30.0000 mL | Freq: Once | ORAL | Status: AC
  Administered 2020-11-27: 30 mL via ORAL
  Filled 2020-11-27: qty 30

## 2020-11-27 MED ORDER — NYSTATIN 100000 UNIT/GM EX POWD
Freq: Two times a day (BID) | CUTANEOUS | Status: DC
  Filled 2020-11-27 (×2): qty 15

## 2020-11-27 MED ORDER — HYDRALAZINE HCL 25 MG PO TABS
25.0000 mg | ORAL_TABLET | Freq: Three times a day (TID) | ORAL | Status: DC
  Administered 2020-11-27 – 2020-11-29 (×6): 25 mg via ORAL
  Filled 2020-11-27 (×6): qty 1

## 2020-11-27 MED ORDER — NITROGLYCERIN 2 % TD OINT
1.0000 [in_us] | TOPICAL_OINTMENT | Freq: Once | TRANSDERMAL | Status: AC
  Administered 2020-11-27: 1 [in_us] via TOPICAL
  Filled 2020-11-27: qty 1

## 2020-11-27 MED ORDER — CARVEDILOL 12.5 MG PO TABS
25.0000 mg | ORAL_TABLET | Freq: Once | ORAL | Status: AC
  Administered 2020-11-27: 25 mg via ORAL
  Filled 2020-11-27: qty 2

## 2020-11-27 MED ORDER — FAMOTIDINE 20 MG PO TABS
20.0000 mg | ORAL_TABLET | Freq: Once | ORAL | Status: AC
  Administered 2020-11-27: 20 mg via ORAL
  Filled 2020-11-27: qty 1

## 2020-11-27 MED ORDER — HYDRALAZINE HCL 25 MG PO TABS
25.0000 mg | ORAL_TABLET | Freq: Once | ORAL | Status: AC
  Administered 2020-11-27: 25 mg via ORAL
  Filled 2020-11-27: qty 1

## 2020-11-27 MED ORDER — ACETAMINOPHEN 325 MG PO TABS: 650.0000 mg | ORAL_TABLET | Freq: Four times a day (QID) | ORAL | Status: DC | PRN

## 2020-11-27 MED ORDER — POLYETHYLENE GLYCOL 3350 17 G PO PACK: 17.0000 g | PACK | Freq: Every day | ORAL | Status: DC | PRN

## 2020-11-27 MED ORDER — DOCUSATE SODIUM 283 MG RE ENEM: 1.0000 | ENEMA | RECTAL | Status: DC | PRN

## 2020-11-27 MED ORDER — NEPRO/CARBSTEADY PO LIQD: 237.0000 mL | Freq: Three times a day (TID) | ORAL | Status: DC | PRN

## 2020-11-27 MED ORDER — ZOLPIDEM TARTRATE 5 MG PO TABS: 5.0000 mg | ORAL_TABLET | Freq: Every evening | ORAL | Status: DC | PRN

## 2020-11-27 MED ORDER — ONDANSETRON HCL 4 MG PO TABS: 4.0000 mg | ORAL_TABLET | Freq: Four times a day (QID) | ORAL | Status: DC | PRN

## 2020-11-27 MED ORDER — BISACODYL 5 MG PO TBEC: 5.0000 mg | DELAYED_RELEASE_TABLET | Freq: Every day | ORAL | Status: DC | PRN

## 2020-11-27 MED ORDER — FUROSEMIDE 10 MG/ML IJ SOLN
40.0000 mg | Freq: Two times a day (BID) | INTRAMUSCULAR | Status: DC
  Administered 2020-11-27 – 2020-11-29 (×5): 40 mg via INTRAVENOUS
  Filled 2020-11-27 (×5): qty 4

## 2020-11-27 MED ORDER — HEPARIN SODIUM (PORCINE) 5000 UNIT/ML IJ SOLN
5000.0000 [IU] | Freq: Three times a day (TID) | INTRAMUSCULAR | Status: DC
  Administered 2020-11-27 – 2020-11-29 (×7): 5000 [IU] via SUBCUTANEOUS
  Filled 2020-11-27 (×7): qty 1

## 2020-11-27 MED ORDER — AMLODIPINE BESYLATE 5 MG PO TABS
10.0000 mg | ORAL_TABLET | Freq: Once | ORAL | Status: AC
  Administered 2020-11-27: 10 mg via ORAL
  Filled 2020-11-27: qty 2

## 2020-11-27 MED ORDER — SODIUM CHLORIDE 0.9% FLUSH
3.0000 mL | Freq: Two times a day (BID) | INTRAVENOUS | Status: DC
  Administered 2020-11-28 – 2020-11-29 (×3): 3 mL via INTRAVENOUS

## 2020-11-27 MED ORDER — HYDROXYZINE HCL 25 MG PO TABS: 25.0000 mg | ORAL_TABLET | Freq: Three times a day (TID) | ORAL | Status: DC | PRN

## 2020-11-27 MED ORDER — CARVEDILOL 25 MG PO TABS
25.0000 mg | ORAL_TABLET | Freq: Two times a day (BID) | ORAL | Status: DC
  Administered 2020-11-27 – 2020-11-29 (×4): 25 mg via ORAL
  Filled 2020-11-27: qty 1
  Filled 2020-11-27: qty 2
  Filled 2020-11-27: qty 1
  Filled 2020-11-27: qty 2

## 2020-11-27 MED ORDER — ONDANSETRON HCL 4 MG/2ML IJ SOLN: 4.0000 mg | Freq: Four times a day (QID) | INTRAMUSCULAR | Status: DC | PRN

## 2020-11-27 MED ORDER — DOCUSATE SODIUM 100 MG PO CAPS
100.0000 mg | ORAL_CAPSULE | Freq: Two times a day (BID) | ORAL | Status: DC
  Administered 2020-11-27 – 2020-11-29 (×5): 100 mg via ORAL
  Filled 2020-11-27 (×5): qty 1

## 2020-11-27 MED ORDER — INSULIN ASPART 100 UNIT/ML IJ SOLN: 0.0000 [IU] | Freq: Every day | INTRAMUSCULAR | Status: DC

## 2020-11-27 MED ORDER — TOLNAFTATE 1 % EX POWD
Freq: Two times a day (BID) | CUTANEOUS | Status: DC
  Filled 2020-11-27: qty 45

## 2020-11-27 MED ORDER — HYDRALAZINE HCL 20 MG/ML IJ SOLN: 5.0000 mg | INTRAMUSCULAR | Status: DC | PRN

## 2020-11-27 MED ORDER — CALCIUM CARBONATE ANTACID 1250 MG/5ML PO SUSP: 500.0000 mg | Freq: Four times a day (QID) | ORAL | Status: DC | PRN

## 2020-11-27 MED ORDER — AMLODIPINE BESYLATE 10 MG PO TABS
10.0000 mg | ORAL_TABLET | Freq: Every day | ORAL | Status: DC
  Administered 2020-11-28 – 2020-11-29 (×2): 10 mg via ORAL
  Filled 2020-11-27: qty 1
  Filled 2020-11-27: qty 2

## 2020-11-27 MED ORDER — INSULIN ASPART 100 UNIT/ML IJ SOLN
0.0000 [IU] | Freq: Three times a day (TID) | INTRAMUSCULAR | Status: DC
  Administered 2020-11-27: 2 [IU] via SUBCUTANEOUS
  Administered 2020-11-27 – 2020-11-28 (×2): 3 [IU] via SUBCUTANEOUS
  Administered 2020-11-28 – 2020-11-29 (×3): 2 [IU] via SUBCUTANEOUS

## 2020-11-27 MED ORDER — FUROSEMIDE 10 MG/ML IJ SOLN
40.0000 mg | Freq: Once | INTRAMUSCULAR | Status: AC
  Administered 2020-11-27: 40 mg via INTRAVENOUS
  Filled 2020-11-27: qty 4

## 2020-11-27 MED ORDER — SORBITOL 70 % SOLN
30.0000 mL | Status: DC | PRN
  Filled 2020-11-27: qty 30

## 2020-11-27 MED ORDER — CAMPHOR-MENTHOL 0.5-0.5 % EX LOTN: 1.0000 "application " | TOPICAL_LOTION | Freq: Three times a day (TID) | CUTANEOUS | Status: DC | PRN

## 2020-11-27 NOTE — Progress Notes (Signed)
  Echocardiogram 2D Echocardiogram has been performed.  [name] 11/27/2020, 2:35 PM

## 2020-11-27 NOTE — ED Provider Notes (Signed)
Emergency Medicine Provider Triage Evaluation Note  [name] , a 50 y.o. male  was evaluated in triage.  Pt complains of chest pain and SOB.  States it has been going on for "a while," but worsened today.  He denies cough or fever.  EMS reports that he has CHF and COPD, but is non-complaint with meds.  States he used an inhaler PTA.Marland Kitchen  Review of Systems  Positive: SOB, CP Negative: Cough, fever  Physical Exam  BP (!) 192/121 (BP Location: Right Arm)   Pulse 91   Temp 98.6 F (37 C) (Oral)   Resp 18   SpO2 100%  Gen:   Awake, no distress   Resp:  Normal effort  MSK:   Moves extremities without difficulty  Other:    Medical Decision Making  Medically screening exam initiated at 5:59 AM.  Appropriate orders placed.  DONTERIUS RIDOLFI was informed that the remainder of the evaluation will be completed by another provider, this initial triage assessment does not replace that evaluation, and the importance of remaining in the ED until their evaluation is complete.     Montine Circle, PA-C 11/27/20 0600    Mesner, Corene Cornea, MD 11/27/20 [phone]

## 2020-11-27 NOTE — ED Provider Notes (Signed)
[name] EMERGENCY DEPARTMENT Provider Note   CSN: [id number] Arrival date & time: 11/27/20  0555     History Chief Complaint  Patient presents with   Shortness of Breath    [name] is a 50 y.o. male.  Patient with hx htn, c/o mid chest pain in past couple days. Occurs at rest, constant, dull, non radiating. No change whether upright or supine. No change with activity or exertion. Pain is not pleuritic.  Mild sob, sob w exertion/activity. Denies hx cad or fam hx premature cad. Denies chest wall injury or strain. No heartburn. No cough or uri symptoms. No fever or chills. Denies leg pain or swelling. Has not yet taken his bp meds today. No headache. No change in speech or vision. No numbness or weakness.   The history is provided by the patient.  Shortness of Breath Associated symptoms: chest pain   Associated symptoms: no abdominal pain, no cough, no fever, no headaches, no neck pain, no rash, no sore throat and no vomiting       Past Medical History:  Diagnosis Date   Anxiety 02/2019   Anxiety 10/2019   Diabetes mellitus type II, uncontrolled ([place]) 07/17/2006   Elevated alkaline phosphatase level 01/2019   ESSENTIAL HYPERTENSION 07/17/2006   HYPERLIPIDEMIA 11/24/2008   Insomnia 02/2019   Left bundle branch block 02/22/2019   Lesion of lip 10/2019   Pneumonia 10/22/2011   Suicidal ideations 02/2019   Vitamin D deficiency 01/2019    Patient Active Problem List   Diagnosis Date Noted   Lower extremity edema 03/07/2020   CKD (chronic kidney disease) stage 3, GFR 30-59 ml/min (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   Hyperlipidemia LDL goal <70 11/24/2008   Uncontrolled type 2 diabetes mellitus with peripheral neuropathy ([place]) 07/17/2006   Essential  hypertension 07/17/2006    Past Surgical History:  Procedure Laterality Date   ARM SURGERY         Family History  Problem Relation Age of Onset   Hypertension Mother 97   Cancer Mother        bladder cancer   Alcohol abuse Father    Hypertension Brother     Social History   Tobacco Use   Smoking status: Never   Smokeless tobacco: Never  Vaping Use   Vaping Use: Never used  Substance Use Topics   Alcohol use: No    Alcohol/week: 0.0 standard drinks   Drug use: No    Home Medications Prior to Admission medications   Medication Sig Start Date End Date Taking? Authorizing Provider  amLODipine (NORVASC) 10 MG tablet Take 1 tablet (10 mg total) by mouth daily. 02/22/20 02/11/22  [name], MD  atorvastatin (LIPITOR) 40 MG tablet TAKE 1 TABLET (40 MG TOTAL) BY MOUTH DAILY. Patient not taking: Reported on 08/11/2020 02/23/20 02/22/21  [name], MD  Blood Glucose Monitoring Suppl (TRUE METRIX GO GLUCOSE METER) w/Device KIT USE TO MEASURE BLOOD SUGAR TWICE A DAY 02/22/20 02/21/21  [name], MD  Blood Glucose Monitoring Suppl (TRUE METRIX METER) w/Device KIT Use to measure blood sugar twice a day Patient not taking: Reported on 08/11/2020 02/22/20   [name], MD  carvedilol (COREG) 25 MG tablet TAKE 1 TABLET (25 MG TOTAL) BY MOUTH 2 (TWO)  TIMES DAILY. 03/07/20 03/07/21  Lorretta Harp, MD  Continuous Blood Gluc Receiver (FREESTYLE LIBRE 14 DAY READER) DEVI 1 EACH BY DOES NOT APPLY ROUTE 4 (FOUR) TIMES DAILY - BEFORE MEALS AND AT BEDTIME. 08/11/20 08/11/21  [name], FNP  Continuous Blood Gluc Sensor (FREESTYLE LIBRE 14 DAY SENSOR) MISC 1 each by Does not apply route 4 (four) times daily -  before meals and at bedtime. 08/11/20   [name], FNP  furosemide (LASIX) 40 MG tablet Take 1 tablet (40 mg total) by mouth daily. Take for 4 days then stop 08/10/20   Montine Circle, PA-C  furosemide (LASIX) 40 MG tablet TAKE 1 TABLET BY MOUTH DAILY  AND AS DIRECTED 07/03/20 07/03/21  Claudia Desanctis, MD  glucose blood test strip Use to check fasting blood sugar once daily. 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 Insulin dependent Patient not taking: Reported on 08/11/2020 02/22/20   [name], MD  hydrALAZINE (APRESOLINE) 25 MG tablet TAKE 1 TABLET (25 MG TOTAL) BY MOUTH 3 (THREE) TIMES DAILY. Patient not taking: Reported on 08/11/2020 03/07/20 03/07/21  Lorretta Harp, MD  insulin aspart (NOVOLOG FLEXPEN) 100 UNIT/ML FlexPen Inject 10 Units into the skin 2 (two) times daily with a meal. Patient not taking: Reported on 08/11/2020 02/23/20   [name], MD  insulin detemir (LEVEMIR FLEXTOUCH) 100 UNIT/ML FlexPen Inject 35 Units into the skin daily. Patient not taking: Reported on 08/11/2020 02/22/20   [name], MD  Insulin Pen Needle (PEN NEEDLES 29GX1/2") 29G X 12MM MISC Use with insulin pens Patient not taking: Reported on 08/11/2020 02/22/20   [name], MD  potassium chloride SA (KLOR-CON) 20 MEQ tablet TAKE 1 TABLET (20 MEQ TOTAL) BY MOUTH DAILY. TAKE FOR 4 DAYS THEN STOP Patient not taking: Reported on 08/11/2020 06/29/20 06/29/21  Deberah Pelton, NP  sildenafil (VIAGRA) 25 MG tablet Take 1 tablet (25 mg total) by mouth as needed for erectile dysfunction. for erectile dysfunction. Patient not taking: Reported on 08/11/2020 10/13/19   [name], FNP  sitaGLIPtin (JANUVIA) 25 MG tablet Take 1 tablet (25 mg total) by mouth daily. Patient not taking: Reported on 08/11/2020 02/22/20   [name], MD  TRUEplus Lancets 28G MISC USE TO MEASURE BLOOD SUGAR TWICE A DAY Patient not taking: Reported on 08/11/2020 02/22/20 02/21/21  [name], MD  Vitamin D, Ergocalciferol, (DRISDOL) 1.25 MG (50000 UNIT) CAPS capsule Take 50,000 Units by mouth once a week. Patient not taking: Reported on 08/11/2020 01/26/20   [provider]  Vitamin D, Ergocalciferol, (DRISDOL) 1.25 MG (50000 UNIT) CAPS capsule TAKE 1 CAPSULE BY MOUTH ONCE A  WEEK 07/07/20 07/07/21  Claudia Desanctis, MD  gabapentin (NEURONTIN) 300 MG capsule Take 1 capsule (300 mg total) by mouth 3 (three) times daily. Patient not taking: Reported on 07/27/2019 07/29/18 08/30/19  [name], FNP    Allergies    Sulfa antibiotics, Bactrim [sulfamethoxazole-trimethoprim], Cephalexin, and Other  Review of Systems   Review of Systems  Constitutional:  Negative for fever.  HENT:  Negative for sore throat.   Eyes:  Negative for redness.  Respiratory:  Positive for shortness of breath. Negative for cough.   Cardiovascular:  Positive for chest pain. Negative for palpitations and leg swelling.  Gastrointestinal:  Negative for abdominal pain, nausea and vomiting.  Genitourinary:  Negative for flank pain.  Musculoskeletal:  Negative for back pain and neck pain.  Skin:  Negative for rash.  Neurological:  Negative for headaches.  Hematological:  Does not bruise/bleed easily.  Psychiatric/Behavioral:  Negative for confusion.    Physical Exam Updated Vital Signs BP (!) 191/118   Pulse 89   Temp 98.4 F (36.9 C) (Oral)   Resp 17   Ht 1.702 m (_0 )   Wt 65.8 kg   SpO2 97%   BMI 22.71 kg/m   Physical Exam Vitals and nursing note reviewed.  Constitutional:      Appearance: Normal appearance. He is well-developed.  HENT:     Head: Atraumatic.     Nose: Nose normal.     Mouth/Throat:     Mouth: Mucous membranes are moist.     Pharynx: Oropharynx is clear.  Eyes:     General: No scleral icterus.    Conjunctiva/sclera: Conjunctivae normal.     Pupils: Pupils are equal, round, and reactive to light.  Neck:     Trachea: No tracheal deviation.  Cardiovascular:     Rate and Rhythm: Normal rate and regular rhythm.     Pulses: Normal pulses.     Heart sounds: Normal heart sounds. No murmur heard.   No friction rub. No gallop.  Pulmonary:     Effort: Pulmonary effort is normal. No accessory muscle usage or respiratory distress.     Breath sounds: Normal  breath sounds.  Chest:     Chest wall: No tenderness.  Abdominal:     General: Bowel sounds are normal. There is no distension.     Palpations: Abdomen is soft.     Tenderness: There is no abdominal tenderness. There is no guarding.     Comments: No bruits.   Genitourinary:    Comments: No cva tenderness. Musculoskeletal:        General: No swelling or tenderness.     Cervical back: Normal range of motion and neck supple. No rigidity.     Right lower leg: No edema.     Left lower leg: No edema.  Skin:    General: Skin is warm and dry.     Findings: No rash.  Neurological:     Mental Status: He is alert.     Comments: Alert, speech clear. Motor/sens grossly intact bil.   Psychiatric:        Mood and Affect: Mood normal.    ED Results / Procedures / Treatments   Labs (all labs ordered are listed, but only abnormal results are displayed) Results for orders placed or performed during the hospital encounter of 95/18/84  Basic metabolic panel  Result Value Ref Range   Sodium 135 135 - 145 mmol/L   Potassium 4.0 3.5 - 5.1 mmol/L   Chloride 105 98 - 111 mmol/L   CO2 24 22 - 32 mmol/L   Glucose, Bld 212 (H) 70 - 99 mg/dL   BUN 45 (H) 6 - 20 mg/dL   Creatinine, Ser 4.29 (H) 0.61 - 1.24 mg/dL   Calcium 7.3 (L) 8.9 - 10.3 mg/dL   GFR, Estimated 16 (L) >60 mL/min   Anion gap 6 5 - 15  CBC  Result Value Ref Range   WBC 6.2 4.0 - 10.5 K/uL   RBC 4.03 (L) 4.22 - 5.81 MIL/uL   Hemoglobin 10.8 (L) 13.0 - 17.0 g/dL   HCT 34.9 (L) 39.0 - 52.0 %   MCV 86.6 80.0 - 100.0 fL   MCH 26.8 26.0 - 34.0 pg   MCHC 30.9 30.0 - 36.0 g/dL   RDW 13.2 11.5 - 15.5 %  Platelets 360 150 - 400 K/uL   nRBC 0.0 0.0 - 0.2 %  Brain natriuretic peptide  Result Value Ref Range   B Natriuretic Peptide 1,009.9 (H) 0.0 - 100.0 pg/mL  Troponin I (High Sensitivity)  Result Value Ref Range   Troponin I (High Sensitivity) 77 (H) <18 ng/L   DG Chest 2 View  Result Date: 11/27/2020 CLINICAL DATA:  Left lower  chest pain for a few days EXAM: CHEST - 2 VIEW COMPARISON:  10/01/2020 FINDINGS: Chronic cardiomegaly. Stable mediastinal contours. Mild interstitial coarsening with a few Kerley lines. No effusion or pneumothorax. No acute osseous finding. IMPRESSION: Chronic cardiomegaly with borderline interstitial edema. Electronically Signed   By: Monte Fantasia M.D.   On: 11/27/2020 06:20    EKG EKG Interpretation  Date/Time:  Monday November 27 2020 05:46:23 EDT Ventricular Rate:  90 PR Interval:  140 QRS Duration: 140 QT Interval:  430 QTC Calculation: 526 R Axis:   116 Text Interpretation: Normal sinus rhythm Right axis deviation Left bundle branch block Confirmed by Lajean Saver [id number]) on 11/27/2020 9:42:55 AM  Radiology DG Chest 2 View  Result Date: 11/27/2020 CLINICAL DATA:  Left lower chest pain for a few days EXAM: CHEST - 2 VIEW COMPARISON:  10/01/2020 FINDINGS: Chronic cardiomegaly. Stable mediastinal contours. Mild interstitial coarsening with a few Kerley lines. No effusion or pneumothorax. No acute osseous finding. IMPRESSION: Chronic cardiomegaly with borderline interstitial edema. Electronically Signed   By: Monte Fantasia M.D.   On: 11/27/2020 06:20    Procedures Procedures   Medications Ordered in ED Medications  carvedilol (COREG) tablet 25 mg (has no administration in time range)  amLODipine (NORVASC) tablet 10 mg (has no administration in time range)  hydrALAZINE (APRESOLINE) tablet 25 mg (has no administration in time range)  alum & mag hydroxide-simeth (MAALOX/MYLANTA) 200-200-20 MG/5ML suspension 30 mL (has no administration in time range)  famotidine (PEPCID) tablet 20 mg (has no administration in time range)  acetaminophen (TYLENOL) tablet 1,000 mg (has no administration in time range)  furosemide (LASIX) injection 40 mg (has no administration in time range)    ED Course  I have reviewed the triage vital signs and the nursing notes.  Pertinent labs & imaging results  that were available during my care of the patient were reviewed by me and considered in my medical decision making (see chart for details).    MDM Rules/Calculators/A&P                         Iv ns. Continuous pulse ox and cardiac monitoring. Stat labs. CXR. ECG.  Reviewed nursing notes and prior charts for additional history. Prior cardiac ct with ca score 0.   Labs reviewed/interpreted by me - initial trop mildly elevated. Hgb is c/w prior. Creatinine mildly higher than prior.  CXR reviewed/interpreted by me - ?mild vascular congestion. Lasix iv.   Pt has not taken his normal bp meds today -  meds given.   Given severe, uncontrolled htn, likely contributing to CHF, AKI, will admit to medicine.   Medicine consulted for admission.    Final Clinical Impression(s) / ED Diagnoses Final diagnoses:  None    Rx / DC Orders ED Discharge Orders     None        Lajean Saver, MD 11/27/20 [phone]

## 2020-11-27 NOTE — ED Triage Notes (Signed)
Pt BIB [facility] from home c/o chest pain for past 3-4 days, worsening today. Initially rating 9/10. HX HTN, CHF, and DM non-complaint with medications. Given 324 ASA and x3 NTG. Rates pain 1/10 in triage. No acute respiratory distress.

## 2020-11-27 NOTE — ED Notes (Signed)
He has been sleeping most of the day and want to be left alone as much as possible otherwise pleasant.
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
## 2020-11-27 NOTE — ED Notes (Signed)
ED Provider at bedside. 

## 2020-11-27 NOTE — ED Notes (Signed)
Received verbal report from [place] at this time

## 2020-11-28 ENCOUNTER — Encounter (HOSPITAL_COMMUNITY): Payer: Self-pay | Admitting: Internal Medicine

## 2020-11-28 LAB — CBC
HCT: 31.7 % — ABNORMAL LOW (ref 39.0–52.0)
Hemoglobin: 10.1 g/dL — ABNORMAL LOW (ref 13.0–17.0)
MCH: 26.9 pg (ref 26.0–34.0)
MCHC: 31.9 g/dL (ref 30.0–36.0)
MCV: 84.3 fL (ref 80.0–100.0)
Platelets: 295 10*3/uL (ref 150–400)
RBC: 3.76 MIL/uL — ABNORMAL LOW (ref 4.22–5.81)
RDW: 13.2 % (ref 11.5–15.5)
WBC: 4.9 10*3/uL (ref 4.0–10.5)
nRBC: 0 % (ref 0.0–0.2)

## 2020-11-28 LAB — BASIC METABOLIC PANEL
Anion gap: 5 (ref 5–15)
BUN: 46 mg/dL — ABNORMAL HIGH (ref 6–20)
CO2: 23 mmol/L (ref 22–32)
Calcium: 7.1 mg/dL — ABNORMAL LOW (ref 8.9–10.3)
Chloride: 108 mmol/L (ref 98–111)
Creatinine, Ser: 4.51 mg/dL — ABNORMAL HIGH (ref 0.61–1.24)
GFR, Estimated: 15 mL/min — ABNORMAL LOW (ref >60)
Glucose, Bld: 170 mg/dL — ABNORMAL HIGH (ref 70–99)
Potassium: 3.6 mmol/L (ref 3.5–5.1)
Sodium: 136 mmol/L (ref 135–145)

## 2020-11-28 LAB — GLUCOSE, CAPILLARY
Glucose-Capillary: 137 mg/dL — ABNORMAL HIGH (ref 70–99)
Glucose-Capillary: 142 mg/dL — ABNORMAL HIGH (ref 70–99)

## 2020-11-28 LAB — CBG MONITORING, ED
Glucose-Capillary: 143 mg/dL — ABNORMAL HIGH (ref 70–99)
Glucose-Capillary: 160 mg/dL — ABNORMAL HIGH (ref 70–99)

## 2020-11-28 MED ORDER — CYCLOBENZAPRINE HCL 5 MG PO TABS
2.5000 mg | ORAL_TABLET | Freq: Once | ORAL | Status: AC
  Administered 2020-11-28: 2.5 mg via ORAL
  Filled 2020-11-28: qty 0.5

## 2020-11-28 NOTE — Progress Notes (Signed)
Heart Failure Navigator Progress Note  Assessed for Heart & Vascular TOC clinic readiness.  Unfortunately at this time the patient does not meet criteria due to advanced CKD.   Navigator available for reassessment of patient.   Samrat Hayward Boothby, PharmD, BCPS Heart Failure Stewardship Pharmacist Phone [phone]   

## 2020-11-28 NOTE — Progress Notes (Signed)
OT Cancellation Note  Patient Details Name: JRUE ORMES MRN: [id number] DOB: 1971-02-26   Cancelled Treatment:    Reason Eval/Treat Not Completed: OT screened, no needs identified, will sign off (Per discussion with PT, pt was independent with mobility and performing own ADL at this time. No skilled OT services required. [place] ambulation of pt OOB and in hallway while in hospital.)  Jefferey Pica, OTR/L [place] Pager: [phone] Office: [phone]'   Augustin Schooling 11/28/2020, 4:32 PM
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
## 2020-11-28 NOTE — Evaluation (Signed)
Physical Therapy Evaluation and Discharge Patient Details Name: [name] MRN: [id number] DOB: 1970/09/14 Today's Date: 11/28/2020   History of Present Illness  50 y.o. male presented 11/27/20 with chest pain and SOB. + hypertensive crisis, progressive renal failure; uncontrolled DM  PMH significant of HTN; DM; HLD; chronic systolic CHF; and medical noncompliance .  Clinical Impression   Patient evaluated by Physical Therapy with no further acute PT needs identified.Patient is independent with mobility. PT is signing off. Thank you for this referral.     Follow Up Recommendations No PT follow up    Equipment Recommendations  None recommended by PT    Recommendations for Other Services       Precautions / Restrictions Precautions Precautions: None      Mobility  Bed Mobility Overal bed mobility: Independent                  Transfers Overall transfer level: Independent                  Ambulation/Gait Ambulation/Gait assistance: Independent Gait Distance (Feet): 100 Feet Assistive device: None Gait Pattern/deviations: WFL(Within Functional Limits)   Gait velocity interpretation: >2.62 ft/sec, indicative of community ambulatory General Gait Details: mild drift to rt (?because PT on his left vs feeling like his blood sugar is low)  Stairs            Wheelchair Mobility    Modified Rankin (Stroke Patients Only)       Balance Overall balance assessment: Independent                                           Pertinent Vitals/Pain Pain Assessment: No/denies pain    Home Living Family/patient expects to be discharged to:: Private residence Living Arrangements: Parent;Other relatives (mom and brothers)   Type of Home: Mobile home Home Access: Stairs to enter Entrance Stairs-Rails: Right Entrance Stairs-Number of Steps: 4 Home Layout: One level Home Equipment: None      Prior Function Level of Independence:  Independent               Hand Dominance   Dominant Hand: Left    Extremity/Trunk Assessment   Upper Extremity Assessment Upper Extremity Assessment: Overall WFL for tasks assessed    Lower Extremity Assessment Lower Extremity Assessment: Overall WFL for tasks assessed    Cervical / Trunk Assessment Cervical / Trunk Assessment: Normal  Communication   Communication: No difficulties  Cognition Arousal/Alertness: Awake/alert Behavior During Therapy: WFL for tasks assessed/performed Overall Cognitive Status: Within Functional Limits for tasks assessed                                        General Comments General comments (skin integrity, edema, etc.): notified RN that pt felt his blood sugar was low; RN requested PT give pt graham crackers and OJ; pt up in recliner eating/drinking on departure    Exercises     Assessment/Plan    PT Assessment Patent does not need any further PT services  PT Problem List         PT Treatment Interventions      PT Goals (Current goals can be found in the Care Plan section)  Acute Rehab PT Goals Patient Stated Goal: NA PT Goal Formulation: All  assessment and education complete, DC therapy    Frequency     Barriers to discharge        Co-evaluation               AM-PAC PT "6 Clicks" Mobility  Outcome Measure Help needed turning from your back to your side while in a flat bed without using bedrails?: None Help needed moving from lying on your back to sitting on the side of a flat bed without using bedrails?: None Help needed moving to and from a bed to a chair (including a wheelchair)?: None Help needed standing up from a chair using your arms (e.g., wheelchair or bedside chair)?: None Help needed to walk in hospital room?: None Help needed climbing 3-5 steps with a railing? : None 6 Click Score: 24    End of Session   Activity Tolerance: Patient tolerated treatment well Patient left: in  chair;with call bell/phone within reach Nurse Communication: Mobility status PT Visit Diagnosis: Difficulty in walking, not elsewhere classified (R26.2)    Time: 1435-1455 PT Time Calculation (min) (ACUTE ONLY): 20 min   Charges:   PT Evaluation $PT Eval Low Complexity: 1 Low           Arby Barrette, PT Pager [phone]   Rexanne Mano 11/28/2020, 3:07 PM
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
## 2020-11-29 ENCOUNTER — Encounter (HOSPITAL_COMMUNITY): Payer: Self-pay | Admitting: Cardiology

## 2020-11-29 ENCOUNTER — Other Ambulatory Visit (HOSPITAL_COMMUNITY): Payer: Self-pay

## 2020-11-29 DIAGNOSIS — I169 Hypertensive crisis, unspecified: Secondary | ICD-10-CM

## 2020-11-29 DIAGNOSIS — I10 Essential (primary) hypertension: Secondary | ICD-10-CM

## 2020-11-29 DIAGNOSIS — N179 Acute kidney failure, unspecified: Secondary | ICD-10-CM

## 2020-11-29 DIAGNOSIS — I5041 Acute combined systolic (congestive) and diastolic (congestive) heart failure: Secondary | ICD-10-CM | POA: Diagnosis present

## 2020-11-29 DIAGNOSIS — I5043 Acute on chronic combined systolic (congestive) and diastolic (congestive) heart failure: Secondary | ICD-10-CM

## 2020-11-29 LAB — BASIC METABOLIC PANEL
Anion gap: 5 (ref 5–15)
BUN: 51 mg/dL — ABNORMAL HIGH (ref 6–20)
CO2: 25 mmol/L (ref 22–32)
Calcium: 7.1 mg/dL — ABNORMAL LOW (ref 8.9–10.3)
Chloride: 107 mmol/L (ref 98–111)
Creatinine, Ser: 4.92 mg/dL — ABNORMAL HIGH (ref 0.61–1.24)
GFR, Estimated: 14 mL/min — ABNORMAL LOW (ref >60)
Glucose, Bld: 132 mg/dL — ABNORMAL HIGH (ref 70–99)
Potassium: 3.6 mmol/L (ref 3.5–5.1)
Sodium: 137 mmol/L (ref 135–145)

## 2020-11-29 LAB — MAGNESIUM: Magnesium: 1.8 mg/dL (ref 1.7–2.4)

## 2020-11-29 LAB — GLUCOSE, CAPILLARY
Glucose-Capillary: 59 mg/dL — ABNORMAL LOW (ref 70–99)
Glucose-Capillary: 88 mg/dL (ref 70–99)
Glucose-Capillary: 131 mg/dL — ABNORMAL HIGH (ref 70–99)
Glucose-Capillary: 84 mg/dL (ref 70–99)

## 2020-11-29 MED ORDER — INSULIN PEN NEEDLE 32G X 4 MM MISC
1 refills | Status: DC
  Filled 2020-11-29: qty 100, 30d supply, fill #0

## 2020-11-29 MED ORDER — NOVOLOG FLEXPEN 100 UNIT/ML ~~LOC~~ SOPN
10.0000 [IU] | PEN_INJECTOR | Freq: Two times a day (BID) | SUBCUTANEOUS | 2 refills | Status: DC
  Filled 2020-11-29: qty 6, 30d supply, fill #0

## 2020-11-29 MED ORDER — LEVEMIR FLEXTOUCH 100 UNIT/ML ~~LOC~~ SOPN
35.0000 [IU] | PEN_INJECTOR | Freq: Every day | SUBCUTANEOUS | 11 refills | Status: DC
  Filled 2020-11-29: qty 12, 30d supply, fill #0

## 2020-11-29 MED ORDER — GLUCOSE BLOOD VI STRP
ORAL_STRIP | 12 refills | Status: DC
  Filled 2020-11-29: qty 100, 30d supply, fill #0

## 2020-11-29 MED ORDER — ACCU-CHEK GUIDE W/DEVICE KIT
PACK | 0 refills | Status: DC
  Filled 2020-11-29: qty 1, 1d supply, fill #0

## 2020-11-29 MED ORDER — ATORVASTATIN CALCIUM 40 MG PO TABS
40.0000 mg | ORAL_TABLET | Freq: Every day | ORAL | 3 refills | Status: DC
  Filled 2020-11-29: qty 30, 30d supply, fill #0

## 2020-11-29 MED ORDER — TRUEPLUS LANCETS 28G MISC
1 refills | Status: AC
  Filled 2020-11-29: qty 100, 30d supply, fill #0

## 2020-11-29 MED ORDER — FUROSEMIDE 40 MG PO TABS
40.0000 mg | ORAL_TABLET | Freq: Every day | ORAL | 11 refills | Status: DC
  Filled 2020-11-29: qty 30, 30d supply, fill #0

## 2020-11-29 MED ORDER — CARVEDILOL 25 MG PO TABS
25.0000 mg | ORAL_TABLET | Freq: Two times a day (BID) | ORAL | 3 refills | Status: DC
  Filled 2020-11-29: qty 60, 30d supply, fill #0

## 2020-11-29 MED ORDER — HYDRALAZINE HCL 25 MG PO TABS
25.0000 mg | ORAL_TABLET | Freq: Three times a day (TID) | ORAL | 3 refills | Status: DC
  Filled 2020-11-29: qty 90, 30d supply, fill #0

## 2020-11-29 NOTE — Progress Notes (Signed)
Pt ordered to be d/c by provider, pt has been educated and d/c instructions has been discussed. Pt acknowledged understanding

## 2020-11-29 NOTE — TOC Initial Note (Signed)
Transition of Care [facility]) - Initial/Assessment Note    Patient Details  Name: [name] MRN: [id number] Date of Birth: [birth year]  Transition of [facility]) CM/SW Contact:    Joanne Chars, LCSW Phone Number: 11/29/2020, 10:00 AM  Clinical Narrative:   CSW met with pt regarding consult for insulin supplies and also readmission risk.  Pt PCP listed as [facility], however pt reports he has been seen more recently at [facility] and [facility].  Pt reports he usually takes the bus to medical appts.  Pt does need help with insulin supplies and is asking if a blood sugar scanner and an insulin pump would be an option.  Pt does confirm he is uninsured.  Permission given to speak with his mother, Olivia Mackie.  Pt has been vaccinated for covid, but not boosted.                  Expected Discharge Plan: Home/Self Care Barriers to Discharge: Continued Medical Work up, Inadequate or no insurance   Patient Goals and CMS Choice Patient states their goals for this hospitalization and ongoing recovery are:: resolve vision issues CMS Medicare.gov Compare Post Acute Care list provided to::  (NA)    Expected Discharge Plan and Services Expected Discharge Plan: Home/Self Care In-house Referral: Clinical Social Work   Post Acute Care Choice: Durable Medical Equipment Living arrangements for the past 2 months: Single Family Home                           HH Arranged: NA          Prior Living Arrangements/Services Living arrangements for the past 2 months: Single Family Home Lives with:: Parents, Siblings Patient language and need for interpreter reviewed:: Yes Do you feel safe going back to the place where you live?: Yes      Need for Family Participation in Patient Care: No (Comment) Care giver support system in place?: Yes (comment) Current home services: Other (comment) (na) Criminal Activity/Legal Involvement Pertinent to Current Situation/Hospitalization: No - Comment as  needed  Activities of Daily Living Home Assistive Devices/Equipment: None ADL Screening (condition at time of admission) Patient's cognitive ability adequate to safely complete daily activities?: Yes Is the patient deaf or have difficulty hearing?: No Does the patient have difficulty seeing, even when wearing glasses/contacts?: No Does the patient have difficulty concentrating, remembering, or making decisions?: No Patient able to express need for assistance with ADLs?: Yes Does the patient have difficulty dressing or bathing?: No Independently performs ADLs?: Yes (appropriate for developmental age) Does the patient have difficulty walking or climbing stairs?: No Weakness of Legs: None Weakness of Arms/Hands: None  Permission Sought/Granted Permission sought to share information with : Family Supports Permission granted to share information with : Yes, Verbal Permission Granted  Share Information with NAME: mother, Olivia Mackie           Emotional Assessment Appearance:: Appears stated age Attitude/Demeanor/Rapport: Engaged Affect (typically observed): Appropriate, Pleasant Orientation: : Oriented to Self, Oriented to Place, Oriented to Situation (not charted) Alcohol / Substance Use: Not Applicable Psych Involvement: No (comment)  Admission diagnosis:  Hypertensive crisis [I16.9] Elevated troponin [R77.8] Uncontrolled hypertension [I10] AKI (acute kidney injury) ([place]) [N17.9] Acute on chronic combined systolic and diastolic CHF (congestive heart failure) ([place]) [I50.43] Patient Active Problem List   Diagnosis Date Noted   Hypertensive crisis 11/27/2020   Lower extremity edema 03/07/2020   Stage 4 chronic kidney disease ([place]) 02/22/2020  Left ventricular dysfunction 04/27/2019   Hemoglobin A1C greater than 9%, indicating poor diabetic control 07/29/2018   New onset left bundle branch block (LBBB)    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   Hyperlipidemia LDL goal <70  11/24/2008   Uncontrolled type 2 diabetes mellitus with peripheral neuropathy ([place]) 07/17/2006   Essential hypertension 07/17/2006   PCP:  [name], FNP (Inactive) Pharmacy:   [facility] [address] [place] [address] Phone: [phone] Fax: [phone]  [place]), [place] - [address] [place] [address] Phone: [phone] Fax: [phone]     Social Determinants of Health (SDOH) Interventions    Readmission Risk Interventions No flowsheet data found.

## 2020-11-29 NOTE — Discharge Summary (Signed)
Discharge Summary  [name] DTO:[id number] DOB: 1970/06/21  PCP: [name], FNP (Inactive)  Admit date: 11/27/2020 Discharge date: 11/29/2020  Time spent: 25 minutes  Recommendations for Outpatient Follow-up:  New medications: Patient has been off of many of his medications.  He has been given new prescriptions which will go to transitions of care pharmacy so that he will have his physical medicines upon discharge.  These include his Coreg, Lasix, Lipitor and hydralazine. Patient will follow up with nephrology in the next few weeks to establish with them.  Discharge Diagnoses:  Active Hospital Problems   Diagnosis Date Noted   Hypertensive crisis 11/27/2020   Stage 4 chronic kidney disease ([place]) 02/22/2020   Hyperlipidemia LDL goal <70 11/24/2008   Uncontrolled type 2 diabetes mellitus with peripheral neuropathy ([employer]) 07/17/2006    Resolved Hospital Problems  No resolved problems to display.    Discharge Condition: Improved, being discharged home  Diet recommendation: Low-sodium, carb modified  Vitals:   11/29/20 0403 11/29/20 0811  BP: (!) 156/85 121/63  Pulse: 80 77  Resp: 20 14  Temp: 98.2 F (36.8 C) 97.6 F (36.4 C)  SpO2: 100% 100%    History of present illness:  50 year old male with past medical history of hypertension, hyperlipidemia, diabetes mellitus with neuropathy and stage IV chronic kidney disease noncompliant with his medication who presented to the emergency room on 7/11 with complaints of shortness of breath.  He stated that he had not been taking his blood pressure medications because they made him go to the bathroom frequently.  Also reports not taking his diabetic medications due to them being stolen.  The emergency room, found to have a blood pressure of 191/121 with creatinine of 4.29 and BNP of 1010.  Chest x-ray with cardiomegaly and interstitial edema.  Patient started on IV Lasix and blood pressure medications.  Hospital Course:   Principal Problem: Acute systolic/diastolic heart failure: Patient given IV Lasix.  By 7/13 had diuresed over a liter of fluid.  Breathing comfortably on room air at 100%.  Was not hypoxic.  Advised restarting his Coreg, Lasix, should do well with this.  Evaluated by heart failure TOC clinic, but with his stage IV chronic kidney disease not felt to be candidate.  He will follow-up with nephrology Hypertensive crisis Active Problems:   Uncontrolled type 2 diabetes mellitus with peripheral neuropathy [facility]): Reordered home medications.  A1c at 9.7.   Hyperlipidemia LDL goal <70   Stage 4 chronic kidney disease ([place]): By day of discharge, creatinine of 4.9 with GFR 14.  Patient will follow up with nephrology as outpatient.   Procedures: Echocardiogram notes grade 1 diastolic dysfunction and ejection fraction of 30%.  Consultations: None  Discharge Exam: BP 121/63 (BP Location: Left Arm)   Pulse 77   Temp 97.6 F (36.4 C) (Oral)   Resp 14   Ht _0  (1.702 m)   Wt 69.1 kg   SpO2 100%   BMI 23.86 kg/m   General: Alert and oriented x3, no acute distress Cardiovascular: Regular rate and rhythm, S1-S2, 2 out of 6 systolic ejection murmur Respiratory: Clear to auscultation bilaterally  Discharge Instructions You were cared for by a hospitalist during your hospital stay. If you have any questions about your discharge medications or the care you received while you were in the hospital after you are discharged, you can call the unit and asked to speak with the hospitalist on call if the hospitalist that took care of you is  not available. Once you are discharged, your primary care physician will handle any further medical issues. Please note that NO REFILLS for any discharge medications will be authorized once you are discharged, as it is imperative that you return to your primary care physician (or establish a relationship with a primary care physician if you do not have one) for your aftercare  needs so that they can reassess your need for medications and monitor your lab values.  Discharge Instructions     Diet - low sodium heart healthy   Complete by: As directed    Increase activity slowly   Complete by: As directed       Allergies as of 11/29/2020       Reactions   Sulfa Antibiotics Rash   Severe rash. Do not give sulfa medications   Bactrim [sulfamethoxazole-trimethoprim]    Rash   Cephalexin    Patient with severe drug reaction including exfoliating skin rash and hypotension after being prescribed both cephalexin and sulfamethoxazole-trimethoprim simultaneously. Favor SMX as much more likely culprit as patient had tolerated other cephalosporins in the past, but cannot say with complete certainty that this was not related to cephalexin.    Other    Patient states he is allergic to the TB test        Medication List     STOP taking these medications    FreeStyle Libre 14 Day Reader [facility] 14 Day Sensor Misc       TAKE these medications    amLODipine 10 MG tablet Commonly known as: NORVASC Take 1 tablet (10 mg total) by mouth daily.   atorvastatin 40 MG tablet Commonly known as: LIPITOR Take 1 tablet (40 mg total) by mouth daily. What changed: how much to take   carvedilol 25 MG tablet Commonly known as: COREG Take 1 tablet (25 mg total) by mouth 2 (two) times daily. What changed: how much to take   furosemide 40 MG tablet Commonly known as: LASIX Take 1 tablet (40 mg total) by mouth daily. What changed:  how much to take how to take this when to take this   glucose blood test strip Use to check fasting blood sugar once daily. 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 Insulin dependent   hydrALAZINE 25 MG tablet Commonly known as: APRESOLINE Take 1 tablet (25 mg total) by mouth 3 (three) times daily. What changed: how much to take   Levemir FlexTouch 100 UNIT/ML FlexPen Generic drug: insulin detemir Inject 35 Units into the skin daily.    NovoLOG FlexPen 100 UNIT/ML FlexPen Generic drug: insulin aspart Inject 10 Units into the skin 2 (two) times daily with a meal.   PEN NEEDLES 29GX1/2" 29G X 12MM Misc Use with insulin pens   True Metrix Meter w/Device Kit Use to measure blood sugar twice a day What changed: Another medication with the same name was removed. Continue taking this medication, and follow the directions you see here.   TRUEplus Lancets 28G Misc USE TO MEASURE BLOOD SUGAR TWICE A DAY       Allergies  Allergen Reactions   Sulfa Antibiotics Rash    Severe rash. Do not give sulfa medications   Bactrim [Sulfamethoxazole-Trimethoprim]     Rash   Cephalexin     Patient with severe drug reaction including exfoliating skin rash and hypotension after being prescribed both cephalexin and sulfamethoxazole-trimethoprim simultaneously. Favor SMX as much more likely culprit as patient had tolerated other cephalosporins in the past, but cannot say  with complete certainty that this was not related to cephalexin.    Other     Patient states he is allergic to the TB test    Follow-up Information     Rosita Fire, MD. Schedule an appointment as soon as possible for a visit in 1 week(s).   Specialties: Nephrology, Internal Medicine Why: Establish with kidney doctor Contact information: [place] [address] [phone]                  The results of significant diagnostics from this hospitalization (including imaging, microbiology, ancillary and laboratory) are listed below for reference.    Significant Diagnostic Studies: DG Chest 2 View  Result Date: 11/27/2020 CLINICAL DATA:  Left lower chest pain for a few days EXAM: CHEST - 2 VIEW COMPARISON:  10/01/2020 FINDINGS: Chronic cardiomegaly. Stable mediastinal contours. Mild interstitial coarsening with a few Kerley lines. No effusion or pneumothorax. No acute osseous finding. IMPRESSION: Chronic cardiomegaly with borderline  interstitial edema. Electronically Signed   By: Monte Fantasia M.D.   On: 11/27/2020 06:20   ECHOCARDIOGRAM COMPLETE  Result Date: 11/27/2020    ECHOCARDIOGRAM REPORT   Patient Name:   [name] Date of Exam: 11/27/2020 Medical Rec #:  [id number]       Height:       67.0 in Accession #:    [id number]      Weight:       145.0 lb Date of Birth:  07/18/1970      BSA:          1.764 m Patient Age:    26 years        BP:           144/87 mmHg Patient Gender: M               HR:           73 bpm. Exam Location:  Inpatient Procedure: 2D Echo, Cardiac Doppler and Color Doppler Indications:    I50.23 Acute on chronic systolic (congestive) heart failure  History:        Patient has prior history of Echocardiogram examinations, most                 recent 06/02/2020. Arrythmias:LBBB; Risk Factors:Hypertension,                 Diabetes and Dyslipidemia.  Sonographer:    Jonelle Sidle Dance Referring Phys: [place]  1. Left ventricular ejection fraction, by estimation, is 30 to 35%. The left ventricle has moderately decreased function. The left ventricle demonstrates global hypokinesis. There is severe concentric left ventricular hypertrophy. Left ventricular diastolic parameters are consistent with Grade I diastolic dysfunction (impaired relaxation).  2. Right ventricular systolic function is normal. The right ventricular size is normal. Mildly increased right ventricular wall thickness. There is normal pulmonary artery systolic pressure.  3. Left atrial size was moderately dilated.  4. A small pericardial effusion is present. The pericardial effusion is circumferential.  5. The mitral valve is grossly normal. Mild mitral valve regurgitation.  6. The aortic valve is tricuspid. There is mild calcification of the aortic valve. There is mild thickening of the aortic valve. Aortic valve regurgitation is not visualized. Mild aortic valve sclerosis is present, with no evidence of aortic valve stenosis.  7.  The inferior vena cava is normal in size with greater than 50% respiratory variability, suggesting right atrial pressure of 3 mmHg.  8. Severe, concerntric  LVH most likely secondary to severe HTN, however, amyloidosis is also on the differential. Could consider CMR as out-patient to assess further pending clinical suspicion. Comparison(s): Compared to prior TTE on 06/02/20, the LVEF is now moderately reduced 30-35%. FINDINGS  Left Ventricle: Left ventricular ejection fraction, by estimation, is 30 to 35%. The left ventricle has moderately decreased function. The left ventricle demonstrates global hypokinesis. The left ventricular internal cavity size was normal in size. There is severe concentric left ventricular hypertrophy. Left ventricular diastolic parameters are consistent with Grade I diastolic dysfunction (impaired relaxation). Right Ventricle: The right ventricular size is normal. Mildly increased right ventricular wall thickness. Right ventricular systolic function is normal. There is normal pulmonary artery systolic pressure. The tricuspid regurgitant velocity is 2.70 m/s, and with an assumed right atrial pressure of 3 mmHg, the estimated right ventricular systolic pressure is 22.4 mmHg. Left Atrium: Left atrial size was moderately dilated. Right Atrium: Right atrial size was normal in size. Pericardium: A small pericardial effusion is present. The pericardial effusion is circumferential. Mitral Valve: The mitral valve is grossly normal. There is mild thickening of the mitral valve leaflet(s). There is mild calcification of the mitral valve leaflet(s). Mild mitral annular calcification. Mild mitral valve regurgitation. Tricuspid Valve: The tricuspid valve is normal in structure. Tricuspid valve regurgitation is trivial. Aortic Valve: The aortic valve is tricuspid. There is mild calcification of the aortic valve. There is mild thickening of the aortic valve. Aortic valve regurgitation is not visualized.  Mild aortic valve sclerosis is present, with no evidence of aortic valve stenosis. Pulmonic Valve: The pulmonic valve was normal in structure. Pulmonic valve regurgitation is trivial. Aorta: The aortic root and ascending aorta are structurally normal, with no evidence of dilitation. Venous: The inferior vena cava is normal in size with greater than 50% respiratory variability, suggesting right atrial pressure of 3 mmHg. IAS/Shunts: No atrial level shunt detected by color flow Doppler.  LEFT VENTRICLE PLAX 2D LVIDd:         5.30 cm  Diastology LVIDs:         4.20 cm  LV e' medial:    4.46 cm/s LV PW:         1.60 cm  LV E/e' medial:  13.7 LV IVS:        1.10 cm  LV e' lateral:   4.03 cm/s LVOT diam:     1.80 cm  LV E/e' lateral: 15.2 LV SV:         38 LV SV Index:   21 LVOT Area:     2.54 cm  RIGHT VENTRICLE             IVC RV Basal diam:  2.90 cm     IVC diam: 1.30 cm RV S prime:     11.50 cm/s TAPSE (M-mode): 2.2 cm LEFT ATRIUM             Index       RIGHT ATRIUM           Index LA diam:        3.90 cm 2.21 cm/m  RA Area:     14.30 cm LA Vol (A2C):   63.1 ml 35.77 ml/m RA Volume:   32.90 ml  18.65 ml/m LA Vol (A4C):   79.2 ml 44.90 ml/m LA Biplane Vol: 74.4 ml 42.18 ml/m  AORTIC VALVE LVOT Vmax:   73.10 cm/s LVOT Vmean:  49.950 cm/s LVOT VTI:    0.149 m  AORTA Ao  Root diam: 3.20 cm Ao Asc diam:  3.20 cm MITRAL VALVE               TRICUSPID VALVE MV Area (PHT): 3.21 cm    TR Peak grad:   29.2 mmHg MV Decel Time: 236 msec    TR Vmax:        270.00 cm/s MV E velocity: 61.30 cm/s MV A velocity: 75.80 cm/s  SHUNTS MV E/A ratio:  0.81        Systemic VTI:  0.15 m                            Systemic Diam: 1.80 cm Gwyndolyn Kaufman MD Electronically signed by Gwyndolyn Kaufman MD Signature Date/Time: 11/27/2020/4:44:55 PM    Final     Microbiology: Recent Results (from the past 240 hour(s))  SARS CORONAVIRUS 2 (TAT 6-24 HRS) Nasopharyngeal Nasopharyngeal Swab     Status: None   Collection Time: 11/27/20 10:28  AM   Specimen: Nasopharyngeal Swab  Result Value Ref Range Status   SARS Coronavirus 2 NEGATIVE NEGATIVE Final    Comment: (NOTE) SARS-CoV-2 target nucleic acids are NOT DETECTED.  The SARS-CoV-2 RNA is generally detectable in upper and lower respiratory specimens during the acute phase of infection. Negative results do not preclude SARS-CoV-2 infection, do not rule out co-infections with other pathogens, and should not be used as the sole basis for treatment or other patient management decisions. Negative results must be combined with clinical observations, patient history, and epidemiological information. The expected result is Negative.  Fact Sheet for Patients: SugarRoll.be  Fact Sheet for Healthcare Providers: https://www.woods-mathews.com/  This test is not yet approved or cleared by the [place] FDA and  has been authorized for detection and/or diagnosis of SARS-CoV-2 by FDA under an Emergency Use Authorization (EUA). This EUA will remain  in effect (meaning this test can be used) for the duration of the COVID-19 declaration under Se ction 564(b)(1) of the Act, 21 U.S.C. section 360bbb-3(b)(1), unless the authorization is terminated or revoked sooner.  Performed at [facility], [place] [address], [place], [place] [address]      Labs: Basic Metabolic Panel: Recent Labs  Lab 11/27/20 0609 11/28/20 0615 11/29/20 0146  NA 135 136 137  K 4.0 3.6 3.6  CL 105 108 107  CO2 _0 GLUCOSE 212* 170* 132*  BUN 45* 46* 51*  CREATININE 4.29* 4.51* 4.92*  CALCIUM 7.3* 7.1* 7.1*  MG  --   --  1.8   Liver Function Tests: No results for input(s): AST, ALT, ALKPHOS, BILITOT, PROT, ALBUMIN in the last 168 hours. No results for input(s): LIPASE, AMYLASE in the last 168 hours. No results for input(s): AMMONIA in the last 168 hours. CBC: Recent Labs  Lab 11/27/20 0609 11/28/20 0615  WBC 6.2 4.9  HGB 10.8* 10.1*  HCT  34.9* 31.7*  MCV 86.6 84.3  PLT 360 295   Cardiac Enzymes: No results for input(s): CKTOTAL, CKMB, CKMBINDEX, TROPONINI in the last 168 hours. BNP: BNP (last 3 results) Recent Labs    08/21/20 0102 08/23/20 0152 11/27/20 0609  BNP 453.5* 192.4* 1,009.9*    ProBNP (last 3 results) No results for input(s): PROBNP in the last 8760 hours.  CBG: Recent Labs  Lab 11/28/20 1614 11/28/20 2005 11/29/20 0421 11/29/20 0810 11/29/20 1154  GLUCAP 137* 142* 59* 88 131*       Signed:  Diamon Reddinger K  [place] Pink, MD [facility] 11/29/2020, 12:00 PM

## 2020-11-29 NOTE — Progress Notes (Signed)
Pt received medication from pharmacy, at this time pt is waiting for his mother to pick him up

## 2020-12-08 NOTE — Telephone Encounter (Signed)
Na

## 2020-12-21 ENCOUNTER — Inpatient Hospital Stay (INDEPENDENT_AMBULATORY_CARE_PROVIDER_SITE_OTHER): Payer: Self-pay | Admitting: Primary Care

## 2021-01-15 ENCOUNTER — Ambulatory Visit: Payer: Self-pay | Admitting: Nurse Practitioner

## 2021-01-18 ENCOUNTER — Ambulatory Visit: Payer: Self-pay | Admitting: Nurse Practitioner

## 2021-02-14 ENCOUNTER — Other Ambulatory Visit: Payer: Self-pay

## 2021-02-17 ENCOUNTER — Other Ambulatory Visit: Payer: Self-pay

## 2021-02-17 ENCOUNTER — Emergency Department (HOSPITAL_COMMUNITY): Payer: Self-pay

## 2021-02-17 ENCOUNTER — Inpatient Hospital Stay (HOSPITAL_COMMUNITY)
Admission: EM | Admit: 2021-02-17 | Discharge: 2021-02-26 | DRG: 264 | Disposition: A | Discharge status: Home / Self Care | Payer: Self-pay | Attending: Internal Medicine | Admitting: Internal Medicine

## 2021-02-17 DIAGNOSIS — I10 Essential (primary) hypertension: Secondary | ICD-10-CM

## 2021-02-17 DIAGNOSIS — D631 Anemia in chronic kidney disease: Secondary | ICD-10-CM | POA: Diagnosis present

## 2021-02-17 DIAGNOSIS — R7989 Other specified abnormal findings of blood chemistry: Secondary | ICD-10-CM

## 2021-02-17 DIAGNOSIS — E1122 Type 2 diabetes mellitus with diabetic chronic kidney disease: Secondary | ICD-10-CM | POA: Diagnosis present

## 2021-02-17 DIAGNOSIS — Z882 Allergy status to sulfonamides status: Secondary | ICD-10-CM

## 2021-02-17 DIAGNOSIS — E119 Type 2 diabetes mellitus without complications: Secondary | ICD-10-CM

## 2021-02-17 DIAGNOSIS — Z79899 Other long term (current) drug therapy: Secondary | ICD-10-CM

## 2021-02-17 DIAGNOSIS — E1151 Type 2 diabetes mellitus with diabetic peripheral angiopathy without gangrene: Secondary | ICD-10-CM | POA: Diagnosis present

## 2021-02-17 DIAGNOSIS — Z992 Dependence on renal dialysis: Secondary | ICD-10-CM

## 2021-02-17 DIAGNOSIS — H547 Unspecified visual loss: Secondary | ICD-10-CM | POA: Diagnosis present

## 2021-02-17 DIAGNOSIS — N184 Chronic kidney disease, stage 4 (severe): Secondary | ICD-10-CM | POA: Diagnosis present

## 2021-02-17 DIAGNOSIS — R34 Anuria and oliguria: Secondary | ICD-10-CM | POA: Diagnosis not present

## 2021-02-17 DIAGNOSIS — I5041 Acute combined systolic (congestive) and diastolic (congestive) heart failure: Secondary | ICD-10-CM | POA: Diagnosis present

## 2021-02-17 DIAGNOSIS — Z20822 Contact with and (suspected) exposure to covid-19: Secondary | ICD-10-CM | POA: Diagnosis present

## 2021-02-17 DIAGNOSIS — J9601 Acute respiratory failure with hypoxia: Secondary | ICD-10-CM | POA: Diagnosis present

## 2021-02-17 DIAGNOSIS — I5043 Acute on chronic combined systolic (congestive) and diastolic (congestive) heart failure: Principal | ICD-10-CM

## 2021-02-17 DIAGNOSIS — E785 Hyperlipidemia, unspecified: Secondary | ICD-10-CM

## 2021-02-17 DIAGNOSIS — E872 Acidosis, unspecified: Secondary | ICD-10-CM | POA: Diagnosis present

## 2021-02-17 DIAGNOSIS — Z8249 Family history of ischemic heart disease and other diseases of the circulatory system: Secondary | ICD-10-CM

## 2021-02-17 DIAGNOSIS — Z794 Long term (current) use of insulin: Secondary | ICD-10-CM

## 2021-02-17 DIAGNOSIS — Z95828 Presence of other vascular implants and grafts: Secondary | ICD-10-CM

## 2021-02-17 DIAGNOSIS — Z91199 Patient's noncompliance with other medical treatment and regimen due to unspecified reason: Secondary | ICD-10-CM

## 2021-02-17 DIAGNOSIS — N186 End stage renal disease: Secondary | ICD-10-CM | POA: Diagnosis present

## 2021-02-17 DIAGNOSIS — Z888 Allergy status to other drugs, medicaments and biological substances status: Secondary | ICD-10-CM

## 2021-02-17 DIAGNOSIS — I248 Other forms of acute ischemic heart disease: Secondary | ICD-10-CM | POA: Diagnosis present

## 2021-02-17 DIAGNOSIS — Z419 Encounter for procedure for purposes other than remedying health state, unspecified: Secondary | ICD-10-CM

## 2021-02-17 DIAGNOSIS — Z91148 Patient's other noncompliance with medication regimen for other reason: Secondary | ICD-10-CM

## 2021-02-17 DIAGNOSIS — I169 Hypertensive crisis, unspecified: Secondary | ICD-10-CM | POA: Diagnosis present

## 2021-02-17 DIAGNOSIS — Z811 Family history of alcohol abuse and dependence: Secondary | ICD-10-CM

## 2021-02-17 DIAGNOSIS — Z9114 Patient's other noncompliance with medication regimen: Secondary | ICD-10-CM

## 2021-02-17 DIAGNOSIS — Z8052 Family history of malignant neoplasm of bladder: Secondary | ICD-10-CM

## 2021-02-17 DIAGNOSIS — N2581 Secondary hyperparathyroidism of renal origin: Secondary | ICD-10-CM | POA: Diagnosis present

## 2021-02-17 DIAGNOSIS — Z881 Allergy status to other antibiotic agents status: Secondary | ICD-10-CM

## 2021-02-17 DIAGNOSIS — E1165 Type 2 diabetes mellitus with hyperglycemia: Secondary | ICD-10-CM | POA: Diagnosis present

## 2021-02-17 DIAGNOSIS — N185 Chronic kidney disease, stage 5: Secondary | ICD-10-CM

## 2021-02-17 DIAGNOSIS — D72829 Elevated white blood cell count, unspecified: Secondary | ICD-10-CM | POA: Diagnosis present

## 2021-02-17 DIAGNOSIS — R072 Precordial pain: Secondary | ICD-10-CM

## 2021-02-17 DIAGNOSIS — I160 Hypertensive urgency: Secondary | ICD-10-CM | POA: Diagnosis present

## 2021-02-17 DIAGNOSIS — R778 Other specified abnormalities of plasma proteins: Secondary | ICD-10-CM

## 2021-02-17 DIAGNOSIS — I447 Left bundle-branch block, unspecified: Secondary | ICD-10-CM | POA: Diagnosis present

## 2021-02-17 DIAGNOSIS — I132 Hypertensive heart and chronic kidney disease with heart failure and with stage 5 chronic kidney disease, or end stage renal disease: Principal | ICD-10-CM | POA: Diagnosis present

## 2021-02-17 LAB — COMPREHENSIVE METABOLIC PANEL
ALT: 13 U/L (ref 0–44)
AST: 20 U/L (ref 15–41)
Albumin: 1.9 g/dL — ABNORMAL LOW (ref 3.5–5.0)
Alkaline Phosphatase: 105 U/L (ref 38–126)
Anion gap: 9 (ref 5–15)
BUN: 49 mg/dL — ABNORMAL HIGH (ref 6–20)
CO2: 20 mmol/L — ABNORMAL LOW (ref 22–32)
Calcium: 7 mg/dL — ABNORMAL LOW (ref 8.9–10.3)
Chloride: 108 mmol/L (ref 98–111)
Creatinine, Ser: 5.22 mg/dL — ABNORMAL HIGH (ref 0.61–1.24)
GFR, Estimated: 13 mL/min — ABNORMAL LOW (ref >60)
Glucose, Bld: 187 mg/dL — ABNORMAL HIGH (ref 70–99)
Potassium: 4.4 mmol/L (ref 3.5–5.1)
Sodium: 137 mmol/L (ref 135–145)
Total Bilirubin: 0.4 mg/dL (ref 0.3–1.2)
Total Protein: 5.5 g/dL — ABNORMAL LOW (ref 6.5–8.1)

## 2021-02-17 LAB — RESP PANEL BY RT-PCR (FLU A&B, COVID) ARPGX2
Influenza A by PCR: NEGATIVE
Influenza B by PCR: NEGATIVE
SARS Coronavirus 2 by RT PCR: NEGATIVE

## 2021-02-17 LAB — TROPONIN I (HIGH SENSITIVITY)
Troponin I (High Sensitivity): 99 ng/L — ABNORMAL HIGH (ref <18)
Troponin I (High Sensitivity): 105 ng/L (ref <18)
Troponin I (High Sensitivity): 122 ng/L (ref <18)

## 2021-02-17 LAB — IRON AND TIBC
Iron: 23 ug/dL — ABNORMAL LOW (ref 45–182)
Saturation Ratios: 8 % — ABNORMAL LOW (ref 17.9–39.5)
TIBC: 277 ug/dL (ref 250–450)
UIBC: 254 ug/dL

## 2021-02-17 LAB — CBC
HCT: 31.3 % — ABNORMAL LOW (ref 39.0–52.0)
Hemoglobin: 9.8 g/dL — ABNORMAL LOW (ref 13.0–17.0)
MCH: 27.2 pg (ref 26.0–34.0)
MCHC: 31.3 g/dL (ref 30.0–36.0)
MCV: 86.9 fL (ref 80.0–100.0)
Platelets: 350 10*3/uL (ref 150–400)
RBC: 3.6 MIL/uL — ABNORMAL LOW (ref 4.22–5.81)
RDW: 13.8 % (ref 11.5–15.5)
WBC: 7.7 10*3/uL (ref 4.0–10.5)
nRBC: 0 % (ref 0.0–0.2)

## 2021-02-17 LAB — FERRITIN: Ferritin: 68 ng/mL (ref 24–336)

## 2021-02-17 LAB — PHOSPHORUS: Phosphorus: 4.9 mg/dL — ABNORMAL HIGH (ref 2.5–4.6)

## 2021-02-17 LAB — MAGNESIUM: Magnesium: 1.5 mg/dL — ABNORMAL LOW (ref 1.7–2.4)

## 2021-02-17 LAB — BRAIN NATRIURETIC PEPTIDE: B Natriuretic Peptide: 1329.8 pg/mL — ABNORMAL HIGH (ref 0.0–100.0)

## 2021-02-17 LAB — MRSA NEXT GEN BY PCR, NASAL: MRSA by PCR Next Gen: NOT DETECTED

## 2021-02-17 MED ORDER — AMLODIPINE BESYLATE 10 MG PO TABS
10.0000 mg | ORAL_TABLET | Freq: Every day | ORAL | Status: DC
  Administered 2021-02-17 – 2021-02-26 (×9): 10 mg via ORAL
  Filled 2021-02-17 (×3): qty 1
  Filled 2021-02-17: qty 2
  Filled 2021-02-17 (×6): qty 1

## 2021-02-17 MED ORDER — HYDRALAZINE HCL 20 MG/ML IJ SOLN
10.0000 mg | Freq: Once | INTRAMUSCULAR | Status: AC
  Administered 2021-02-17: 10 mg via INTRAVENOUS

## 2021-02-17 MED ORDER — ACETAMINOPHEN 325 MG PO TABS
650.0000 mg | ORAL_TABLET | Freq: Four times a day (QID) | ORAL | Status: DC | PRN
  Administered 2021-02-20 – 2021-02-23 (×5): 650 mg via ORAL
  Filled 2021-02-17 (×5): qty 2

## 2021-02-17 MED ORDER — NITROGLYCERIN 0.4 MG SL SUBL
SUBLINGUAL_TABLET | SUBLINGUAL | Status: AC
  Administered 2021-02-17: 0.4 mg
  Filled 2021-02-17: qty 1

## 2021-02-17 MED ORDER — HEPARIN SODIUM (PORCINE) 5000 UNIT/ML IJ SOLN
5000.0000 [IU] | Freq: Three times a day (TID) | INTRAMUSCULAR | Status: DC
  Administered 2021-02-17 – 2021-02-26 (×21): 5000 [IU] via SUBCUTANEOUS
  Filled 2021-02-17 (×20): qty 1

## 2021-02-17 MED ORDER — SODIUM CHLORIDE 0.9 % IV SOLN: INTRAVENOUS | Status: DC

## 2021-02-17 MED ORDER — FUROSEMIDE 10 MG/ML IJ SOLN
60.0000 mg | Freq: Once | INTRAMUSCULAR | Status: AC
  Administered 2021-02-17: 60 mg via INTRAVENOUS
  Filled 2021-02-17: qty 6

## 2021-02-17 MED ORDER — NITROGLYCERIN 0.4 MG SL SUBL
SUBLINGUAL_TABLET | SUBLINGUAL | Status: AC
  Filled 2021-02-17: qty 1

## 2021-02-17 MED ORDER — SODIUM CHLORIDE 0.9% FLUSH
3.0000 mL | Freq: Two times a day (BID) | INTRAVENOUS | Status: DC
  Administered 2021-02-17 – 2021-02-26 (×14): 3 mL via INTRAVENOUS

## 2021-02-17 MED ORDER — NITROGLYCERIN 0.4 MG/HR TD PT24
0.4000 mg | MEDICATED_PATCH | Freq: Once | TRANSDERMAL | Status: AC
  Administered 2021-02-17: 0.4 mg via TRANSDERMAL
  Filled 2021-02-17: qty 1

## 2021-02-17 MED ORDER — INSULIN DETEMIR 100 UNIT/ML ~~LOC~~ SOLN
10.0000 [IU] | Freq: Every day | SUBCUTANEOUS | Status: DC
  Administered 2021-02-18: 10 [IU] via SUBCUTANEOUS
  Filled 2021-02-17 (×3): qty 0.1

## 2021-02-17 MED ORDER — LABETALOL HCL 5 MG/ML IV SOLN
20.0000 mg | Freq: Once | INTRAVENOUS | Status: AC
  Administered 2021-02-17: 20 mg via INTRAVENOUS
  Filled 2021-02-17: qty 4

## 2021-02-17 MED ORDER — SODIUM CHLORIDE 0.9 % IV SOLN: 250.0000 mL | INTRAVENOUS | Status: DC | PRN

## 2021-02-17 MED ORDER — ACETAMINOPHEN 650 MG RE SUPP: 650.0000 mg | Freq: Four times a day (QID) | RECTAL | Status: DC | PRN

## 2021-02-17 MED ORDER — NITROGLYCERIN 0.4 MG/HR TD PT24
0.4000 mg | MEDICATED_PATCH | Freq: Every day | TRANSDERMAL | Status: DC
  Administered 2021-02-18 – 2021-02-26 (×8): 0.4 mg via TRANSDERMAL
  Filled 2021-02-17 (×10): qty 1

## 2021-02-17 MED ORDER — ONDANSETRON HCL 4 MG/2ML IJ SOLN
4.0000 mg | Freq: Once | INTRAMUSCULAR | Status: AC
  Administered 2021-02-17: 4 mg via INTRAVENOUS
  Filled 2021-02-17: qty 2

## 2021-02-17 MED ORDER — PROCHLORPERAZINE EDISYLATE 10 MG/2ML IJ SOLN
5.0000 mg | Freq: Once | INTRAMUSCULAR | Status: AC
  Administered 2021-02-17: 5 mg via INTRAVENOUS
  Filled 2021-02-17: qty 1

## 2021-02-17 MED ORDER — SODIUM CHLORIDE 0.9% FLUSH
3.0000 mL | INTRAVENOUS | Status: DC | PRN
  Administered 2021-02-20: 3 mL via INTRAVENOUS

## 2021-02-17 MED ORDER — HYDRALAZINE HCL 20 MG/ML IJ SOLN
5.0000 mg | Freq: Four times a day (QID) | INTRAMUSCULAR | Status: DC | PRN
  Administered 2021-02-17: 5 mg via INTRAVENOUS
  Filled 2021-02-17 (×2): qty 1

## 2021-02-17 MED ORDER — INSULIN ASPART 100 UNIT/ML IJ SOLN
0.0000 [IU] | Freq: Three times a day (TID) | INTRAMUSCULAR | Status: DC
  Administered 2021-02-18: 2 [IU] via SUBCUTANEOUS
  Administered 2021-02-18 – 2021-02-21 (×3): 1 [IU] via SUBCUTANEOUS
  Administered 2021-02-22: 2 [IU] via SUBCUTANEOUS
  Administered 2021-02-23 – 2021-02-24 (×4): 1 [IU] via SUBCUTANEOUS
  Administered 2021-02-25: 3 [IU] via SUBCUTANEOUS

## 2021-02-17 MED ORDER — HYDRALAZINE HCL 25 MG PO TABS
100.0000 mg | ORAL_TABLET | Freq: Once | ORAL | Status: AC
  Administered 2021-02-17: 100 mg via ORAL
  Filled 2021-02-17: qty 4

## 2021-02-17 NOTE — ED Provider Notes (Signed)
[facility] Provider Note   CSN: [id number] Arrival date & time: 02/17/21  1445     History Chief Complaint  Patient presents with   Chest Pain    [name] is a 50 y.o. male.  Patient w hx htn, DM, and CKD, presents indicating out of his meds for some time, and states left chest pain for past  month. Symptoms occur at rest, constant, dull, moderate, non radiating, not pleuritic. +increased sob, orthopnea. No pnd. No nv or diaphoresis. States bilateral legs chronically swollen/edematous - denies acute change. No exertional cp or discomfort. No abd pain or nvd. No dysuria or gu c/o. No hx cad or fam hx premature cad. No recent surgical, trauma, travel or immobility. No hx dvt or pe.   The history is provided by the patient and medical records.  Chest Pain Associated symptoms: shortness of breath   Associated symptoms: no abdominal pain, no back pain, no cough, no fever, no headache and no vomiting       Past Medical History:  Diagnosis Date   Anxiety 02/2019   Anxiety 10/2019   Diabetes mellitus type II, uncontrolled ([place]) 07/17/2006   Elevated alkaline phosphatase level 01/2019   ESSENTIAL HYPERTENSION 07/17/2006   HYPERLIPIDEMIA 11/24/2008   Insomnia 02/2019   Left bundle branch block 02/22/2019   Lesion of lip 10/2019   Pneumonia 10/22/2011   Suicidal ideations 02/2019   Vitamin D deficiency 01/2019    Patient Active Problem List   Diagnosis Date Noted   Acute combined systolic and diastolic congestive heart failure ([place]) 11/29/2020   Hypertensive crisis 11/27/2020   Lower extremity edema 03/07/2020   Stage 4 chronic kidney disease ([place]) 02/22/2020   Left ventricular dysfunction 04/27/2019   Hemoglobin A1C greater than 9%, indicating poor diabetic control 07/29/2018   New onset left bundle branch block (LBBB)    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   Hyperlipidemia LDL goal <70 11/24/2008   Uncontrolled type 2  diabetes mellitus with peripheral neuropathy 07/17/2006   Essential hypertension 07/17/2006    Past Surgical History:  Procedure Laterality Date   ARM SURGERY         Family History  Problem Relation Age of Onset   Hypertension Mother 86   Cancer Mother        bladder cancer   Alcohol abuse Father    Hypertension Brother     Social History   Tobacco Use   Smoking status: Never   Smokeless tobacco: Never  Vaping Use   Vaping Use: Never used  Substance Use Topics   Alcohol use: No    Alcohol/week: 0.0 standard drinks   Drug use: No    Home Medications Prior to Admission medications   Medication Sig Start Date End Date Taking? Authorizing Provider  amLODipine (NORVASC) 10 MG tablet Take 1 tablet (10 mg total) by mouth daily. 02/22/20 02/11/22  [name], MD  atorvastatin (LIPITOR) 40 MG tablet Take 1 tablet (40 mg total) by mouth daily. 11/29/20   [name], MD  Blood Glucose Monitoring Suppl (ACCU-CHEK GUIDE) w/Device KIT Use as directed 11/29/20   [name], MD  Blood Glucose Monitoring Suppl (TRUE METRIX METER) w/Device KIT Use to measure blood sugar twice a day 02/22/20   [name], MD  carvedilol (COREG) 25 MG tablet Take 1 tablet (25 mg total) by mouth 2 (two) times daily. 11/29/20   [name], MD  furosemide (LASIX) 40  MG tablet Take 1 tablet (40 mg total) by mouth daily. 11/29/20   [name], MD  glucose blood test strip Use to check fasting blood sugar once daily. 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 Insulin dependent 11/29/20   [name], MD  hydrALAZINE (APRESOLINE) 25 MG tablet Take 1 tablet (25 mg total) by mouth 3 (three) times daily. 11/29/20   [name], MD  insulin aspart (NOVOLOG FLEXPEN) 100 UNIT/ML FlexPen Inject 10 Units into the skin 2 (two) times daily with a meal. 11/29/20   [name], MD  insulin detemir (LEVEMIR FLEXTOUCH) 100 UNIT/ML FlexPen Inject 35 Units into the skin daily. 11/29/20   [name], MD  Insulin Pen Needle 32G X 4 MM MISC Use with insulin pens 11/29/20   [name], MD  TRUEplus Lancets 28G MISC USE TO MEASURE BLOOD SUGAR TWICE A DAY 11/29/20 11/29/21  [name], MD  gabapentin (NEURONTIN) 300 MG capsule Take 1 capsule (300 mg total) by mouth 3 (three) times daily. Patient not taking: Reported on 07/27/2019 07/29/18 08/30/19  [name], FNP  sildenafil (VIAGRA) 25 MG tablet Take 1 tablet (25 mg total) by mouth as needed for erectile dysfunction. for erectile dysfunction. Patient not taking: Reported on 08/11/2020 10/13/19 11/27/20  [name], FNP  sitaGLIPtin (JANUVIA) 25 MG tablet Take 1 tablet (25 mg total) by mouth daily. Patient not taking: Reported on 08/11/2020 02/22/20 11/27/20  [name], MD    Allergies    Sulfa antibiotics, Bactrim [sulfamethoxazole-trimethoprim], Cephalexin, and Other  Review of Systems   Review of Systems  Constitutional:  Negative for chills and fever.  HENT:  Negative for sore throat.   Eyes:  Negative for redness and visual disturbance.  Respiratory:  Positive for shortness of breath. Negative for cough.   Cardiovascular:  Positive for chest pain and leg swelling.  Gastrointestinal:  Negative for abdominal pain, diarrhea and vomiting.  Genitourinary:  Negative for decreased urine volume, dysuria and flank pain.  Musculoskeletal:  Negative for back pain and neck pain.  Skin:  Negative for rash.  Neurological:  Negative for headaches.  Hematological:  Does not bruise/bleed easily.  Psychiatric/Behavioral:  Negative for confusion.    Physical Exam Updated Vital Signs BP (!) 202/115   Pulse 100   Resp (!) 23   Ht 1.702 m (5' 7" )   Wt 63.5 kg   SpO2 95%   BMI 21.93 kg/m   Physical Exam Vitals and nursing note reviewed.  Constitutional:      Appearance: Normal appearance. He is well-developed.  HENT:     Head: Atraumatic.     Nose: Nose normal.     Mouth/Throat:     Mouth: Mucous  membranes are moist.     Pharynx: Oropharynx is clear.  Eyes:     General: No scleral icterus.    Conjunctiva/sclera: Conjunctivae normal.     Pupils: Pupils are equal, round, and reactive to light.  Neck:     Trachea: No tracheal deviation.  Cardiovascular:     Rate and Rhythm: Normal rate and regular rhythm.     Pulses: Normal pulses.     Heart sounds: Normal heart sounds. No murmur heard.   No friction rub. No gallop.  Pulmonary:     Effort: Pulmonary effort is normal. No accessory muscle usage or respiratory distress.     Breath sounds: Rales present.     Comments: Rales bases.  Abdominal:     General:  Bowel sounds are normal. There is no distension.     Palpations: Abdomen is soft.     Tenderness: There is no abdominal tenderness. There is no guarding.  Genitourinary:    Comments: No cva tenderness. Musculoskeletal:        General: Swelling present.     Cervical back: Normal range of motion and neck supple. No rigidity.     Right lower leg: Edema present.     Left lower leg: Edema present.     Comments: Symmetric bil leg edema to thighs.   Skin:    General: Skin is warm and dry.     Findings: No rash.  Neurological:     Mental Status: He is alert.     Comments: Alert, speech clear. Motor/sens grossly intact. Steady gait.   Psychiatric:        Mood and Affect: Mood normal.    ED Results / Procedures / Treatments   Labs (all labs ordered are listed, but only abnormal results are displayed) Results for orders placed or performed during the hospital encounter of 02/17/21  CBC  Result Value Ref Range   WBC 7.7 4.0 - 10.5 K/uL   RBC 3.60 (L) 4.22 - 5.81 MIL/uL   Hemoglobin 9.8 (L) 13.0 - 17.0 g/dL   HCT 31.3 (L) 39.0 - 52.0 %   MCV 86.9 80.0 - 100.0 fL   MCH 27.2 26.0 - 34.0 pg   MCHC 31.3 30.0 - 36.0 g/dL   RDW 13.8 11.5 - 15.5 %   Platelets 350 150 - 400 K/uL   nRBC 0.0 0.0 - 0.2 %  Comprehensive metabolic panel  Result Value Ref Range   Sodium 137 135 - 145  mmol/L   Potassium 4.4 3.5 - 5.1 mmol/L   Chloride 108 98 - 111 mmol/L   CO2 20 (L) 22 - 32 mmol/L   Glucose, Bld 187 (H) 70 - 99 mg/dL   BUN 49 (H) 6 - 20 mg/dL   Creatinine, Ser 5.22 (H) 0.61 - 1.24 mg/dL   Calcium 7.0 (L) 8.9 - 10.3 mg/dL   Total Protein 5.5 (L) 6.5 - 8.1 g/dL   Albumin 1.9 (L) 3.5 - 5.0 g/dL   AST 20 15 - 41 U/L   ALT 13 0 - 44 U/L   Alkaline Phosphatase 105 38 - 126 U/L   Total Bilirubin 0.4 0.3 - 1.2 mg/dL   GFR, Estimated 13 (L) >60 mL/min   Anion gap 9 5 - 15  Brain natriuretic peptide  Result Value Ref Range   B Natriuretic Peptide 1,329.8 (H) 0.0 - 100.0 pg/mL  Troponin I (High Sensitivity)  Result Value Ref Range   Troponin I (High Sensitivity) 99 (H) <18 ng/L     EKG EKG Interpretation  Date/Time:  Saturday February 17 2021 14:53:45 EDT Ventricular Rate:  95 PR Interval:  142 QRS Duration: 142 QT Interval:  428 QTC Calculation: 539 R Axis:   -23 Text Interpretation: Sinus rhythm Left bundle branch block No significant change since last tracing Confirmed by Gareth Morgan [phone]) on 02/17/2021 2:56:44 PM  Radiology DG Chest Port 1 View  Result Date: 02/17/2021 CLINICAL DATA:  Chest pain and shortness of breath. EXAM: PORTABLE CHEST 1 VIEW COMPARISON:  11/27/2020 FINDINGS: Moderate cardiomegaly remains stable. Diffuse pulmonary vascular congestion is again demonstrated. No evidence of frank pulmonary edema or focal consolidation. No evidence of pleural effusion. IMPRESSION: Stable cardiomegaly and chronic diffuse pulmonary vascular congestion. Electronically Signed   By: Marlaine Hind  M.D.   On: 02/17/2021 16:09    Procedures Procedures   Medications Ordered in ED Medications  0.9 %  sodium chloride infusion (has no administration in time range)    ED Course  I have reviewed the triage vital signs and the nursing notes.  Pertinent labs & imaging results that were available during my care of the patient were reviewed by me and considered  in my medical decision making (see chart for details).    MDM Rules/Calculators/A&P                          Iv ns. Continuous pulse ox and cardiac monitoring. Stat labs. Pcxr. Ecg.   Reviewed nursing notes and prior charts for additional history.   Recheck bp - high. Hydralazine iv. Labetalol iv.   Labs reviewed/interpreted by me - worsening CKD. Glucose mildly high. Trop elevated. Bnp very high.   Lasix iv.   CXR reviewed/interpreted by me - vascular congestion, chf.   Given uncontrolled/severe htn, worsening CKD, CHF, chest pain, elevated trop - will plan for admission.   Medicine service consulted for admission.   CRITICAL CARE RE: chest pain w elev trop, uncontrolled htn, chf, elevated bnp, worsening ckd.  Performed by: Mirna Mires Total critical care time: 45 minutes Critical care time was exclusive of separately billable procedures and treating other patients. Critical care was necessary to treat or prevent imminent or life-threatening deterioration. Critical care was time spent personally by me on the following activities: development of treatment plan with patient and/or surrogate as well as nursing, discussions with consultants, evaluation of patient's response to treatment, examination of patient, obtaining history from patient or surrogate, ordering and performing treatments and interventions, ordering and review of laboratory studies, ordering and review of radiographic studies, pulse oximetry and re-evaluation of patient's condition.    Final Clinical Impression(s) / ED Diagnoses Final diagnoses:  None    Rx / DC Orders ED Discharge Orders     None        Lajean Saver, MD 02/17/21 [phone]

## 2021-02-17 NOTE — ED Triage Notes (Signed)
Pt BIB [facility] from home c/o intermittent CP on L side of his  chest that does not radiate anywhere, per pt ,this has been going on for over 1 month. HX CHF, HTN and DM.  Per pt, the last time he took his BP meds was 3 weeks ago. Pt checks his sugar, but does not take insulin daily d/t concerns abt sugar dropping too low.   [facility] EMS:  -BP 228/120 -Cbg 241  -O2 97%  -RR 16  -HR 90s

## 2021-02-17 NOTE — H&P (Addendum)
History and Physical    [name] DOB: August 08, 1970 DOA: 02/17/2021  PCP: Vevelyn Francois, NP Consultants:  cardiology: Dr. Gwenlyn Found nephology: Narda Amber kidney: Dr. Royce Macadamia  Patient coming from:  Home - lives with brother and his kids.   Chief Complaint: chest pain   HPI: [name] is a 50 y.o. male with medical history significant of HTN, DM, HLD, chronic systolic CHF, medical noncompliance presenting to ED with chest pain that started a couple of weeks ago and has been intermittent in nature. He will be laying down when he has the pain. Pain located over left lateral chest wall, under left breast. Pain described as sharp in nature and rated 9/10. No radiation. No associated diaphoresis. He is unsure how long episodes of pain last for as he has it at night and falls asleep.  He doesn't have the pain with exertion, but will have shortness of breath. His shortness of breath has also been progressively getting worse over the last 1-2 moths. He has shortness of breath with resting and with exertion. He also has complaints of orthopnea and uses 2 pillows to sleep at night. He also has worsening bilateral lower edema. He also has a cough that started 3-4 months ago. He had an episode of vomiting x 1 today in ED. He denies any acute vision changes or headaches. He is urinating normally.   No fever/chills, dizziness, lightheadedness, abdominal pain, diarrhea, dysuria, rashes. Has blurry vision/decreased vision in his left eye that has been going on "for months."   Patient is poor historian   ED Course: vitals: afebrile, bp: 202/115, HR: 101, RR: 18, oxygen: 100%RA,  Pertinent labs: Hemoglobin 9.8, CO2 20, glucose 187, BUN 49, creatinine 5.22, albumin 1.9, BNP 1329, troponin 99-->105 Chest x-ray shows stable cardiomegaly and chronic diffuse pulmonary vascular congestion.  No evidence of frank pulmonary edema. In ED given 60 mg IV Lasix, 100 mg hydralazine tablet, and 20 mg labetalol  injection.  TRH was called and asked to admit.  Review of Systems: As per HPI; otherwise review of systems reviewed and negative.   Ambulatory Status:  Ambulates without assistance    Past Medical History:  Diagnosis Date   Anxiety 02/2019   Anxiety 10/2019   Diabetes mellitus type II, uncontrolled ([place]) 07/17/2006   Elevated alkaline phosphatase level 01/2019   ESSENTIAL HYPERTENSION 07/17/2006   HYPERLIPIDEMIA 11/24/2008   Insomnia 02/2019   Left bundle branch block 02/22/2019   Lesion of lip 10/2019   Pneumonia 10/22/2011   Suicidal ideations 02/2019   Vitamin D deficiency 01/2019    Past Surgical History:  Procedure Laterality Date   ARM SURGERY      Social History   Socioeconomic History   Marital status: Legally Separated    Spouse name: Not on file   Number of children: Not on file   Years of education: Not on file   Highest education level: Not on file  Occupational History   Occupation: cook    Comment: lost job   Occupation: Biochemist, clinical    Comment: Updated June 2013   Occupation: [employer]: Began Jan 2014  Tobacco Use   Smoking status: Never   Smokeless tobacco: Never  Vaping Use   Vaping Use: Never used  Substance and Sexual Activity   Alcohol use: No    Alcohol/week: 0.0 standard drinks   Drug use: No   Sexual activity: Yes    Partners: Female  Other Topics Concern  Not on file  Social History Narrative   Lives with his mother and brother.   Wife has had a foot amputation; is a type 1 DM.   Social Determinants of Health   Financial Resource Strain: Not on file  Food Insecurity: Not on file  Transportation Needs: Not on file  Physical Activity: Not on file  Stress: Not on file  Social Connections: Not on file  Intimate Partner Violence: Not on file    Allergies  Allergen Reactions   Sulfa Antibiotics Rash    Severe rash. Do not give sulfa medications   Bactrim [Sulfamethoxazole-Trimethoprim]     Rash   Cephalexin      Patient with severe drug reaction including exfoliating skin rash and hypotension after being prescribed both cephalexin and sulfamethoxazole-trimethoprim simultaneously. Favor SMX as much more likely culprit as patient had tolerated other cephalosporins in the past, but cannot say with complete certainty that this was not related to cephalexin.    Other     Patient states he is allergic to the TB test    Family History  Problem Relation Age of Onset   Hypertension Mother 62   Cancer Mother        bladder cancer   Alcohol abuse Father    Hypertension Brother     Prior to Admission medications   Medication Sig Start Date End Date Taking? Authorizing Provider  amLODipine (NORVASC) 10 MG tablet Take 1 tablet (10 mg total) by mouth daily. 02/22/20 02/11/22  [name], MD  atorvastatin (LIPITOR) 40 MG tablet Take 1 tablet (40 mg total) by mouth daily. 11/29/20   [name], MD  Blood Glucose Monitoring Suppl (ACCU-CHEK GUIDE) w/Device KIT Use as directed 11/29/20   [name], MD  Blood Glucose Monitoring Suppl (TRUE METRIX METER) w/Device KIT Use to measure blood sugar twice a day 02/22/20   [name], MD  carvedilol (COREG) 25 MG tablet Take 1 tablet (25 mg total) by mouth 2 (two) times daily. 11/29/20   [name], MD  furosemide (LASIX) 40 MG tablet Take 1 tablet (40 mg total) by mouth daily. 11/29/20   [name], MD  glucose blood test strip Use to check fasting blood sugar once daily. 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 Insulin dependent 11/29/20   [name], MD  hydrALAZINE (APRESOLINE) 25 MG tablet Take 1 tablet (25 mg total) by mouth 3 (three) times daily. 11/29/20   [name], MD  insulin aspart (NOVOLOG FLEXPEN) 100 UNIT/ML FlexPen Inject 10 Units into the skin 2 (two) times daily with a meal. 11/29/20   [name], MD  insulin detemir (LEVEMIR FLEXTOUCH) 100 UNIT/ML FlexPen Inject 35 Units into the skin daily. 11/29/20   [name], MD  Insulin Pen Needle 32G X 4 MM MISC Use with insulin pens 11/29/20   [name], MD  TRUEplus Lancets 28G MISC USE TO MEASURE BLOOD SUGAR TWICE A DAY 11/29/20 11/29/21  [name], MD  gabapentin (NEURONTIN) 300 MG capsule Take 1 capsule (300 mg total) by mouth 3 (three) times daily. Patient not taking: Reported on 07/27/2019 07/29/18 08/30/19  [name], FNP  sildenafil (VIAGRA) 25 MG tablet Take 1 tablet (25 mg total) by mouth as needed for erectile dysfunction. for erectile dysfunction. Patient not taking: Reported on 08/11/2020 10/13/19 11/27/20  [name], FNP  sitaGLIPtin (JANUVIA) 25 MG tablet Take 1 tablet (25 mg total) by mouth daily. Patient not taking:  Reported on 08/11/2020 02/22/20 11/27/20  [name], MD    Physical Exam: Vitals:   02/17/21 1455 02/17/21 1456 02/17/21 1540 02/17/21 1645  BP:    (!) 180/105  Pulse:  95 100 90  Resp:  18 (!) 23 (!) 25  SpO2:  100% 95% 96%  Weight: 63.5 kg     Height: _0  (1.702 m)        General:  Appears calm and comfortable and is in NAD Eyes:  PERRL, EOMI, normal lids, iris ENT:  grossly normal hearing, lips & tongue, mmm; missing dentition  Neck:  no LAD, masses or thyromegaly; no carotid bruits. +JVD Cardiovascular:  RRR, no m/r/g. +bilateral LE pitting edema to below knee. 2+  Respiratory:   bibasilar crackles. No wheezing. Normal respiratory effort. Abdomen:  soft, NT, ND, NABS Back:   normal alignment, no CVAT Skin:  no rash or induration seen on limited exam Musculoskeletal:  grossly normal tone BUE/BLE, good ROM, no bony abnormality Lower extremity:  Limited foot exam with no ulcerations.  2+ distal pulses. Psychiatric:  grossly normal mood and affect, speech fluent and appropriate, AOx3 Neurologic:  CN 2-12 grossly intact, moves all extremities in coordinated fashion, sensation intact    Radiological Exams on Admission: Independently reviewed - see discussion in A/P where  applicable  DG Chest Port 1 View  Result Date: 02/17/2021 CLINICAL DATA:  Chest pain and shortness of breath. EXAM: PORTABLE CHEST 1 VIEW COMPARISON:  11/27/2020 FINDINGS: Moderate cardiomegaly remains stable. Diffuse pulmonary vascular congestion is again demonstrated. No evidence of frank pulmonary edema or focal consolidation. No evidence of pleural effusion. IMPRESSION: Stable cardiomegaly and chronic diffuse pulmonary vascular congestion. Electronically Signed   By: Marlaine Hind M.D.   On: 02/17/2021 16:09    EKG: Independently reviewed.  NSR with rate 94 LBBB more depressed t wave in V5 since last ekg.   Labs on Admission: I have personally reviewed the available labs and imaging studies at the time of the admission.  Pertinent labs:  Hemoglobin 9.8,  CO2 20,  glucose 187,  BUN 49,  creatinine 5.22, (over past 5 months: 3.98-->4.29-->4.51-->4.92-->5.22) albumin 1.9,  BNP 1329,  troponin 99-->105   Assessment/Plan   Hypertensive crisis with CHF -Patient presenting with chest pain and SOB concerning for hypertensive crisis -He did have evidence of CHF with vascular congestion on CXR and elevated BNP, worsening bilateral leg edema.  X-ray does not show worsening vascular congestion or edema though. -Patient has long-standing uncontrolled HTN due to medication non-compliance; this is unlikely to improve unless the patient becomes compliant with his home medications. -The patient received lasix 2m IV, 1068mhydralazine and 2067mabetalol in ED with subsequent decline in BP without apparent difficulty. Added on home norvasc and coreg tonight.  -Will admit to [facility] for ongoing close monitoring -Will cycle troponin but low suspicion for ACS currently. If troponin trends upward significantly, would consult cards.  -last echo: 11/2020: EF of 30 to 35% with moderately decreased left ventricular function.  Left ventricular demonstrates global hypokinesis.  Grade 1 diastolic  dysfunction.  Small pericardial effusion present.  will hold off on repeat echo at this point unless clinically indicated or elevated troponin level. -Continue/resume home meds - Norvasc, Coreg, Hydralazine.  Start home Coreg off at 12.5 as he complains of dizziness at times and titrate up to 25 twice daily if blood pressure is consistently elevated. -prn iv parameters for elevated HTN  - will see his output response to  IV Lasix 60 mg x 1 and adjust daily Lasix as needed while watching renal function with diuresis.  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 Is followed by [facility] but appears he has not been in quite some time.  -strict intake/output -renal labs  -follow closely with diuresis  -outpatient vs. Inpatient nephrology consult per day team and clinical status.  -We frankly discussed the high likelihood of need for HD, particularly if he does not become more compliant   Uncontrolled DM -A1c in 11/2020: 9.7 -tells me he takes his insulin every day but told pharmacy tech that he has not used since August. -start him on basal at 10units (.107m/kg of his TDD) and SSI for meals with worsening renal disease. Adjust as needed.    HLD -resume statin -Lipids last checked (in Epic) in 07/2019 and were 176/44/114/100 -repeat fasting lipid panel in am   Anemia Likely ACD secondary to CKD At his baseline, stable (9.9-10.8) Iron studies pending   Left vision changes Has been losing vision/blurry vision in left eye over months->6. Concern for diabetic  retinopathy or HTN damage. Unsure of his family hx of glaucoma Needs to see opthalmology at discharge.   Body mass index is 21.93 kg/m.    Level of care: Progressive DVT prophylaxis:  heparin  Code Status:  Full - confirmed with patient Family Communication: None present; I attempted to call both his mother, TBenedicto Capozziand significant other, Josylin Richardson at time of admission, but neither answered.  Disposition Plan:  The patient is from: home  Anticipated d/c is to: home  Requires inpatient hospitalization and is at significant risk of worsening, requires constant monitoring, assessment and MDM with specialists.  Patient is currently: acutely ill Consults called: none  Admission status:  observation   Dragon dictation used in completing this note.    AOrma FlamingMD [facility]   How to contact the TSt [facility] LLCAttending or Consulting provider [place] covering provider during after hours [place] for this patient?  Check the care team in [facility]and look for a) attending/consulting [facility] provider listed and b) the TWhite [facility] LLCteam listed Log into [email or web address] and use [place]'s universal password to access. If you do not have the password, please contact the hospital operator. Locate the TNix Health Care Systemprovider you are looking for under [facility] and page to a number that you can be directly reached. If you still have difficulty reaching the provider, please page the [facility](Director on Call) for the Hospitalists listed on amion for assistance.   02/17/2021, 6:51 PM

## 2021-02-18 DIAGNOSIS — I5041 Acute combined systolic (congestive) and diastolic (congestive) heart failure: Secondary | ICD-10-CM

## 2021-02-18 DIAGNOSIS — I160 Hypertensive urgency: Secondary | ICD-10-CM | POA: Diagnosis present

## 2021-02-18 LAB — BLOOD CULTURE ID PANEL (REFLEXED) - BCID2

## 2021-02-18 LAB — TROPONIN I (HIGH SENSITIVITY): Troponin I (High Sensitivity): 109 ng/L (ref <18)

## 2021-02-18 LAB — BASIC METABOLIC PANEL
Anion gap: 12 (ref 5–15)
BUN: 51 mg/dL — ABNORMAL HIGH (ref 6–20)
CO2: 18 mmol/L — ABNORMAL LOW (ref 22–32)
Calcium: 6.9 mg/dL — ABNORMAL LOW (ref 8.9–10.3)
Chloride: 109 mmol/L (ref 98–111)
Creatinine, Ser: 5.27 mg/dL — ABNORMAL HIGH (ref 0.61–1.24)
GFR, Estimated: 13 mL/min — ABNORMAL LOW (ref >60)
Glucose, Bld: 192 mg/dL — ABNORMAL HIGH (ref 70–99)
Potassium: 3.6 mmol/L (ref 3.5–5.1)
Sodium: 139 mmol/L (ref 135–145)

## 2021-02-18 LAB — HEMOGLOBIN A1C
Hgb A1c MFr Bld: 8.8 % — ABNORMAL HIGH (ref 4.8–5.6)
Mean Plasma Glucose: 205.86 mg/dL

## 2021-02-18 LAB — CBC
HCT: 32.6 % — ABNORMAL LOW (ref 39.0–52.0)
Hemoglobin: 10.4 g/dL — ABNORMAL LOW (ref 13.0–17.0)
MCH: 27.2 pg (ref 26.0–34.0)
MCHC: 31.9 g/dL (ref 30.0–36.0)
MCV: 85.1 fL (ref 80.0–100.0)
Platelets: 347 10*3/uL (ref 150–400)
RBC: 3.83 MIL/uL — ABNORMAL LOW (ref 4.22–5.81)
RDW: 13.8 % (ref 11.5–15.5)
WBC: 17.1 10*3/uL — ABNORMAL HIGH (ref 4.0–10.5)
nRBC: 0 % (ref 0.0–0.2)

## 2021-02-18 LAB — GLUCOSE, CAPILLARY
Glucose-Capillary: 108 mg/dL — ABNORMAL HIGH (ref 70–99)
Glucose-Capillary: 132 mg/dL — ABNORMAL HIGH (ref 70–99)
Glucose-Capillary: 136 mg/dL — ABNORMAL HIGH (ref 70–99)
Glucose-Capillary: 162 mg/dL — ABNORMAL HIGH (ref 70–99)
Glucose-Capillary: 183 mg/dL — ABNORMAL HIGH (ref 70–99)

## 2021-02-18 LAB — LIPID PANEL
Cholesterol: 160 mg/dL (ref 0–200)
HDL: 44 mg/dL
LDL Cholesterol: 98 mg/dL (ref 0–99)
Total CHOL/HDL Ratio: 3.6 ratio
Triglycerides: 88 mg/dL
VLDL: 18 mg/dL (ref 0–40)

## 2021-02-18 MED ORDER — POTASSIUM CHLORIDE CRYS ER 20 MEQ PO TBCR
20.0000 meq | EXTENDED_RELEASE_TABLET | Freq: Once | ORAL | Status: AC
  Administered 2021-02-18: 20 meq via ORAL
  Filled 2021-02-18: qty 1

## 2021-02-18 MED ORDER — SODIUM CHLORIDE 0.9 % IV SOLN
250.0000 mg | Freq: Every day | INTRAVENOUS | Status: AC
  Administered 2021-02-19 – 2021-02-21 (×2): 250 mg via INTRAVENOUS
  Filled 2021-02-18 (×4): qty 20

## 2021-02-18 MED ORDER — INSULIN DETEMIR 100 UNIT/ML ~~LOC~~ SOLN
20.0000 [IU] | Freq: Every day | SUBCUTANEOUS | Status: DC
  Administered 2021-02-18 – 2021-02-26 (×8): 20 [IU] via SUBCUTANEOUS
  Filled 2021-02-18 (×9): qty 0.2

## 2021-02-18 MED ORDER — FUROSEMIDE 10 MG/ML IJ SOLN
120.0000 mg | Freq: Two times a day (BID) | INTRAVENOUS | Status: AC
  Administered 2021-02-18 – 2021-02-19 (×2): 120 mg via INTRAVENOUS
  Filled 2021-02-18 (×2): qty 10

## 2021-02-18 MED ORDER — FUROSEMIDE 10 MG/ML IJ SOLN
120.0000 mg | Freq: Two times a day (BID) | INTRAVENOUS | Status: DC
  Filled 2021-02-18 (×2): qty 12

## 2021-02-18 MED ORDER — ASPIRIN EC 81 MG PO TBEC
81.0000 mg | DELAYED_RELEASE_TABLET | Freq: Every day | ORAL | Status: DC
  Administered 2021-02-18 – 2021-02-26 (×9): 81 mg via ORAL
  Filled 2021-02-18 (×10): qty 1

## 2021-02-18 NOTE — Progress Notes (Addendum)
PROGRESS NOTE    [name]  [id number] DOB: [birth year] DOA: 02/17/2021 PCP: Vevelyn Francois, NP   Brief Narrative:  HPI: [name] is a 50 y.o. male with medical history significant of HTN, DM, HLD, chronic systolic CHF, medical noncompliance presenting to ED with chest pain that started a couple of weeks ago and has been intermittent in nature. He will be laying down when he has the pain. Pain located over left lateral chest wall, under left breast. Pain described as sharp in nature and rated 9/10. No radiation. No associated diaphoresis. He is unsure how long episodes of pain last for as he has it at night and falls asleep.  He doesn't have the pain with exertion, but will have shortness of breath. His shortness of breath has also been progressively getting worse over the last 1-2 moths. He has shortness of breath with resting and with exertion. He also has complaints of orthopnea and uses 2 pillows to sleep at night. He also has worsening bilateral lower edema. He also has a cough that started 3-4 months ago. He had an episode of vomiting x 1 today in ED. He denies any acute vision changes or headaches. He is urinating normally.    No fever/chills, dizziness, lightheadedness, abdominal pain, diarrhea, dysuria, rashes. Has blurry vision/decreased vision in his left eye that has been going on "for months."    Patient is poor historian    ED Course: vitals: afebrile, bp: 202/115, HR: 101, RR: 18, oxygen: 100%RA,  Pertinent labs: Hemoglobin 9.8, CO2 20, glucose 187, BUN 49, creatinine 5.22, albumin 1.9, BNP 1329, troponin 99-->105 Chest x-ray shows stable cardiomegaly and chronic diffuse pulmonary vascular congestion.  No evidence of frank pulmonary edema. In ED given 60 mg IV Lasix, 100 mg hydralazine tablet, and 20 mg labetalol injection.  TRH was called and asked to admit.  Assessment & Plan:   Active Problems:   Hyperlipidemia LDL goal <70   Stage 4 chronic kidney disease  ([place])   Hypertensive crisis   Acute combined systolic and diastolic congestive heart failure (HCC)   Type 2 diabetes mellitus (HCC)  Chest pain: Currently chest pain-free.  No acute ST-T wave changes on EKG.  Troponin elevated but flat and in fact not too far away from his baseline creatinine since the 10 to be elevated since last 2 to 3 years likely due to demand ischemia in combination of advanced CKD.  Dyspnea/chronic vascular congestion/acute on chronic combined diastolic and systolic congestive heart failure/progressive CKD stage V/non-anion gap metabolic acidosis: Also came in with shortness of breath and orthopnea.  Echo done in July this year shows 30% ejection fraction with grade 1 diastolic dysfunction and left ventricular global hypokinesis.  No need to repeat echo now.  Currently sleeping comfortably using 1 pillow and denies any shortness of breath.  Was given 1 dose of IV Lasix yesterday with poor output.  Chest x-ray shows chronic cardiomegaly with vascular congestion.  Very faint crackles at the bases on exam.  Not requiring any oxygen.  I have consulted nephrology to help with this.  Fever/leukocytosis: He developed fever of 100.4 and also developed leukocytosis of 17,000.  Did not have any of those yesterday.  Blood culture drawn early this morning.  We will follow those and watch off of antibiotics.  No evidence of infection at this point in time.  Hypertensive urgency: Presented with blood pressure of 202/115.  Currently 121/54.  It appears that patient is supposed to be  taking amlodipine, Coreg, hydralazine as well as Lasix at home but it is not known which of them, if any he was taking.  Currently he is only on amlodipine and blood pressure very well controlled so we will continue this and monitor closely.  Poorly controlled type 2 diabetes mellitus with hyperglycemia: Hemoglobin A1c 9.73 months ago.  Will recheck again.  Supposed to be taking Lantus 35 units at home.  He was  started on 10 units here.  Still hyperglycemic.  Increase to 20 units and continue SSI.  Hyperlipidemia: Continue statin.  Anemia of chronic disease: Hemoglobin is stable.  Chronic visual deficit: Its been going on since 6 months according to H&P.  Patient did not mention anything to me about his vision.  Likely diabetic retinopathy in the face of being noncompliant.  Will need ophthalmology as outpatient.  Long history of noncompliance: Per nephrologist, patient has missed his several appointments with nephrology.  Patient does not seem to be interested in listening to any counseling at this point in time.  During my whole encounter, patient preferred sleeping and kept his eyes closed and in fact did not answer any of my questions until asked twice.  DVT prophylaxis: heparin injection 5,000 Units Start: 02/17/21 2200   Code Status: Full Code  Family Communication:  None present at bedside.  Plan of care discussed with patient in length and he verbalized understanding and agreed with it.  Status is: Observation  The patient will require care spanning > 2 midnights and should be moved to inpatient because: Inpatient level of care appropriate due to severity of illness  Dispo: The patient is from: Home              Anticipated d/c is to: Home              Patient currently is not medically stable to d/c.   Difficult to place patient No        Estimated body mass index is 21.93 kg/m as calculated from the following:   Height as of this encounter: '5\' 7"'$  (1.702 m).   Weight as of this encounter: 63.5 kg.     Nutritional Assessment: Body mass index is 21.93 kg/m.Marland Kitchen Seen by dietician.  I agree with the assessment and plan as outlined below: Nutrition Status:        .  Skin Assessment: I have examined the patient's skin and I agree with the wound assessment as performed by the wound care RN as outlined below:    Consultants:  Nephrology  Procedures:   None  Antimicrobials:  Anti-infectives (From admission, onward)    None          Subjective: Seen and examined.  Sleepy.  Denies any complaint.  No shortness of breath or chest pain.  Objective: Vitals:   02/18/21 0200 02/18/21 0400 02/18/21 0700 02/18/21 0936  BP: (!) 142/91 (!) 117/57 126/73 (!) 121/54  Pulse: (!) 107 (!) 101 97   Resp: 20  18   Temp:  (!) 100.4 F (38 C)    TempSrc:  Oral    SpO2: 95% (!) 88% 93%   Weight:      Height:        Intake/Output Summary (Last 24 hours) at 02/18/2021 1122 Last data filed at 02/18/2021 0300 Gross per 24 hour  Intake 0 ml  Output 225 ml  Net -225 ml   Filed Weights   02/17/21 1455  Weight: 63.5 kg    Examination:  General exam: Appears calm and comfortable  Respiratory system: Faint crackles at bases bilaterally. Respiratory effort normal. Cardiovascular system: S1 & S2 heard, RRR. No JVD, murmurs, rubs, gallops or clicks. No pedal edema. Gastrointestinal system: Abdomen is nondistended, soft and nontender. No organomegaly or masses felt. Normal bowel sounds heard. Central nervous system: Alert and oriented. No focal neurological deficits. Extremities: Symmetric 5 x 5 power. Skin: No rashes, lesions or ulcers Psychiatry: Judgement and insight appear poor   Data Reviewed: I have personally reviewed following labs and imaging studies  CBC: Recent Labs  Lab 02/17/21 1527 02/18/21 0024  WBC 7.7 17.1*  HGB 9.8* 10.4*  HCT 31.3* 32.6*  MCV 86.9 85.1  PLT 350 [id number]   Basic Metabolic Panel: Recent Labs  Lab 02/17/21 1527 02/17/21 2221 02/18/21 0024  NA 137  --  139  K 4.4  --  3.6  CL 108  --  109  CO2 20*  --  18*  GLUCOSE 187*  --  192*  BUN 49*  --  51*  CREATININE 5.22*  --  5.27*  CALCIUM 7.0*  --  6.9*  MG  --  1.5*  --   PHOS  --  4.9*  --    GFR: Estimated Creatinine Clearance: 15.6 mL/min (A) (by C-G formula based on SCr of 5.27 mg/dL (H)). Liver Function Tests: Recent Labs  Lab  02/17/21 1527  AST 20  ALT 13  ALKPHOS 105  BILITOT 0.4  PROT 5.5*  ALBUMIN 1.9*   No results for input(s): LIPASE, AMYLASE in the last 168 hours. No results for input(s): AMMONIA in the last 168 hours. Coagulation Profile: No results for input(s): INR, PROTIME in the last 168 hours. Cardiac Enzymes: No results for input(s): CKTOTAL, CKMB, CKMBINDEX, TROPONINI in the last 168 hours. BNP (last 3 results) No results for input(s): PROBNP in the last 8760 hours. HbA1C: No results for input(s): HGBA1C in the last 72 hours. CBG: Recent Labs  Lab 02/18/21 0032 02/18/21 0625  GLUCAP 183* 162*   Lipid Profile: Recent Labs    02/18/21 0024  CHOL 160  HDL 44  LDLCALC 98  TRIG 88  CHOLHDL 3.6   Thyroid Function Tests: No results for input(s): TSH, T4TOTAL, FREET4, T3FREE, THYROIDAB in the last 72 hours. Anemia Panel: Recent Labs    02/17/21 2221  FERRITIN 68  TIBC 277  IRON 23*   Sepsis Labs: No results for input(s): PROCALCITON, LATICACIDVEN in the last 168 hours.  Recent Results (from the past 240 hour(s))  Resp Panel by RT-PCR (Flu A&B, Covid) Nasopharyngeal Swab     Status: None   Collection Time: 02/17/21  4:45 PM   Specimen: Nasopharyngeal Swab; Nasopharyngeal(NP) swabs in vial transport medium  Result Value Ref Range Status   SARS Coronavirus 2 by RT PCR NEGATIVE NEGATIVE Final    Comment: (NOTE) SARS-CoV-2 target nucleic acids are NOT DETECTED.  The SARS-CoV-2 RNA is generally detectable in upper respiratory specimens during the acute phase of infection. The lowest concentration of SARS-CoV-2 viral copies this assay can detect is 138 copies/mL. A negative result does not preclude SARS-Cov-2 infection and should not be used as the sole basis for treatment or other patient management decisions. A negative result may occur with  improper specimen collection/handling, submission of specimen other than nasopharyngeal swab, presence of viral mutation(s) within  the areas targeted by this assay, and inadequate number of viral copies(<138 copies/mL). A negative result must be combined with clinical observations, patient history, and  epidemiological information. The expected result is Negative.  Fact Sheet for Patients:  EntrepreneurPulse.com.au  Fact Sheet for Healthcare Providers:  IncredibleEmployment.be  This test is no t yet approved or cleared by the [place] FDA and  has been authorized for detection and/or diagnosis of SARS-CoV-2 by FDA under an Emergency Use Authorization (EUA). This EUA will remain  in effect (meaning this test can be used) for the duration of the COVID-19 declaration under Section 564(b)(1) of the Act, 21 U.S.C.section 360bbb-3(b)(1), unless the authorization is terminated  or revoked sooner.       Influenza A by PCR NEGATIVE NEGATIVE Final   Influenza B by PCR NEGATIVE NEGATIVE Final    Comment: (NOTE) The Xpert Xpress SARS-CoV-2/FLU/RSV plus assay is intended as an aid in the diagnosis of influenza from Nasopharyngeal swab specimens and should not be used as a sole basis for treatment. Nasal washings and aspirates are unacceptable for Xpert Xpress SARS-CoV-2/FLU/RSV testing.  Fact Sheet for Patients: EntrepreneurPulse.com.au  Fact Sheet for Healthcare Providers: IncredibleEmployment.be  This test is not yet approved or cleared by the [place] FDA and has been authorized for detection and/or diagnosis of SARS-CoV-2 by FDA under an Emergency Use Authorization (EUA). This EUA will remain in effect (meaning this test can be used) for the duration of the COVID-19 declaration under Section 564(b)(1) of the Act, 21 U.S.C. section 360bbb-3(b)(1), unless the authorization is terminated or revoked.  Performed at [facility], [place] [address] [place], [place] [address]   MRSA Next Gen by PCR, Nasal     Status: None    Collection Time: 02/17/21 10:09 PM   Specimen: Nasal Mucosa; Nasal Swab  Result Value Ref Range Status   MRSA by PCR Next Gen NOT DETECTED NOT DETECTED Final    Comment: (NOTE) The GeneXpert MRSA Assay (FDA approved for NASAL specimens only), is one component of a comprehensive MRSA colonization surveillance program. It is not intended to diagnose MRSA infection nor to guide or monitor treatment for MRSA infections. Test performance is not FDA approved in patients less than 68 years old. Performed at [facility], [place] [address] [place], [place] [address]       Radiology Studies: DG Chest Port 1 View  Result Date: 02/17/2021 CLINICAL DATA:  Chest pain and shortness of breath. EXAM: PORTABLE CHEST 1 VIEW COMPARISON:  11/27/2020 FINDINGS: Moderate cardiomegaly remains stable. Diffuse pulmonary vascular congestion is again demonstrated. No evidence of frank pulmonary edema or focal consolidation. No evidence of pleural effusion. IMPRESSION: Stable cardiomegaly and chronic diffuse pulmonary vascular congestion. Electronically Signed   By: Marlaine Hind M.D.   On: 02/17/2021 16:09    Scheduled Meds:  amLODipine  10 mg Oral Daily   aspirin EC  81 mg Oral Daily   heparin  5,000 Units Subcutaneous Q8H   insulin aspart  0-9 Units Subcutaneous TID WC   insulin detemir  20 Units Subcutaneous Daily   nitroGLYCERIN  0.4 mg Transdermal Daily   nitroGLYCERIN  0.4 mg Transdermal Once   sodium chloride flush  3 mL Intravenous Q12H   Continuous Infusions:  sodium chloride 20 mL/hr at 02/17/21 1737   sodium chloride     furosemide       LOS: 0 days   Time spent: 37 minutes   [name], MD [facility]  02/18/2021, 11:22 AM  Please page via [place] and do not message via secure chat for anything urgent. Secure chat can be used for anything non  urgent.  How to contact the [facility] Attending or Consulting provider [place] or covering provider during after hours [place], for this  patient?  Check the care team in [facility] and look for a) attending/consulting TRH provider listed and b) the [facility] team listed. Page or secure chat [phone]. Log into [email or web address] and use [place]'s universal password to access. If you do not have the password, please contact the hospital operator. Locate the [facility] provider you are looking for under [facility] and page to a number that you can be directly reached. If you still have difficulty reaching the provider, please page the [facility] (Director on Call) for the Hospitalists listed on amion for assistance.

## 2021-02-18 NOTE — Consult Note (Signed)
Nephrology Consult   Requesting provider: [name] Service requesting consult: Hospitalist Reason for consult: CKD 5   Assessment/Recommendations: [name] is a/an 50 y.o. 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  The patient has no acute indication for dialysis.  Does not appear uremic.  Volume status overall appears reassuring to me but he does have some symptoms of shortness of breath and likely mild volume overload.  We started discussions regarding dialysis.  He states that he knew he may require dialysis at some point.  He will think about it tonight but may be reasonable to start dialysis here in the hospital given his likely trajectory but once again does not have an acute indication. -Continue to monitor daily Cr, Dose meds for GFR -Monitor Daily I/Os, Daily weight  -Maintain MAP>65 for optimal renal perfusion.  -Avoid nephrotoxic medications including NSAIDs and Vanc/Zosyn combo -Not concerning for obstruction at this time -Currently no indication for HD; continue discussion tomorrow regarding whether he would start here in the hospital  HFrEF: History of heart failure with EF of 30 to 35% in July 2022.  May benefit from cardiology evaluation to help optimize medications although options likely limited due to his severe CKD. -Hypertension management as below -Likely only mild volume overload; Lasix 120 mg x 2 today and reevaluate tomorrow.  Hypertension: Currently on amlodipine and BP improved. Could consider BB which would be more efficacious in HFrEF  Fever: had a fever to 100.4 this morning.  Leukocytosis to 17 yesterday.  Work up  and treatment per primary. Will get UA.  Anemia due to CKD: Hemoglobin 10.4.  Iron sat 8 and ferritin 68. IV iron '250mg'$  daily x4 doses. Continue to monitor  Uncontrolled Diabetes Mellitus Type 2 with Hyperglycemia: Management per primary  Metabolic acidosis: Bicarb 18.  Mild and likely related to CKD.  Management per primary   Recommendations conveyed to primary service.    [facility] 02/18/2021 12:24 PM   _____________________________________________________________________________________ CC: CKD 5  History of Present Illness: [name] is a/an 50 y.o. male with a past medical history of HTN, DM2, HLD, HFrEF (EF 30 to 35% in July 2022), CKD who presents with shortness of breath.   The patient presented yesterday with worsening chest pain and high blood pressures at home.  He also noticed persistent but slightly worsening dyspnea on exertion.  He described the chest pain is constant and dull and nonradiating.  He has not been taking his medications.  He had an episode of emesis last night but denies any other significant nausea or vomiting.  No issues urinating.  Denies any hematuria.  No NSAID use.  Chest x-ray showed some vascular congestion.  He received IV Lasix 60 mg yesterday with minimal response.  States he feels slightly better today.  The patient is seen Dr. Royce Macadamia in the past.  He saw her in February 2022.  Because of his noncompliance it was not felt he would benefit from kidney biopsy.  He has not followed up regularly.  Labs at [facility] have demonstrated progressive decline in his kidney function.  Creatinine is now 5.3.  Medications:  Current Facility-Administered Medications  Medication Dose Route Frequency Provider Last Rate Last Admin   0.9 %  sodium chloride infusion   Intravenous Continuous Lajean Saver, MD 20 mL/hr at 02/17/21 1737 New Bag at 02/17/21 1737   0.9 %  sodium chloride infusion  250 mL Intravenous PRN Orma Flaming, MD       acetaminophen (TYLENOL) tablet 650 mg  650 mg Oral Q6H PRN Orma Flaming, MD       Or   acetaminophen (TYLENOL) suppository 650 mg  650 mg Rectal Q6H PRN Orma Flaming, MD       amLODipine (NORVASC) tablet 10 mg  10 mg Oral Daily Orma Flaming, MD   10 mg at 02/18/21 [id number]   aspirin EC tablet 81 mg  81 mg Oral Daily Rise Patience, MD   81 mg at 02/18/21 [id number]   furosemide (LASIX) 120 mg in dextrose 5 % 50 mL IVPB  120 mg Intravenous BID Reesa Chew, MD       heparin injection 5,000 Units  5,000 Units Subcutaneous Q8H Orma Flaming, MD   5,000 Units at 02/18/21 [id number]   hydrALAZINE (APRESOLINE) injection 5 mg  5 mg Intravenous Q6H PRN Orma Flaming, MD   5 mg at 02/17/21 2226   insulin aspart (novoLOG) injection 0-9 Units  0-9 Units Subcutaneous TID WC Orma Flaming, MD   2 Units at 02/18/21 [id number]   insulin detemir (LEVEMIR) injection 20 Units  20 Units Subcutaneous Daily [name], MD       nitroGLYCERIN (NITRODUR - Dosed in mg/24 hr) patch 0.4 mg  0.4 mg Transdermal Daily Rise Patience, MD       nitroGLYCERIN (NITRODUR - Dosed in mg/24 hr) patch 0.4 mg  0.4 mg Transdermal Once Orma Flaming, MD   0.4 mg at 02/17/21 2349   potassium chloride SA (KLOR-CON) CR tablet 20 mEq  20 mEq Oral Once Reesa Chew, MD       sodium chloride flush (NS) 0.9 % injection 3 mL  3 mL Intravenous Q12H Orma Flaming, MD   3 mL at 02/17/21 2301   sodium chloride flush (NS) 0.9 % injection 3 mL  3 mL Intravenous PRN Orma Flaming, MD         ALLERGIES Sulfa antibiotics, Bactrim [sulfamethoxazole-trimethoprim], Cephalexin, and Other  MEDICAL HISTORY Past Medical History:  Diagnosis Date   Anxiety 02/2019   Anxiety 10/2019   Diabetes mellitus type II, uncontrolled ([place]) 07/17/2006   Elevated alkaline phosphatase level 01/2019   ESSENTIAL HYPERTENSION 07/17/2006   HYPERLIPIDEMIA 11/24/2008   Insomnia 02/2019   Left bundle branch block 02/22/2019   Lesion of lip  10/2019   Pneumonia 10/22/2011   Suicidal ideations 02/2019   Vitamin D deficiency 01/2019     SOCIAL HISTORY Social History   Socioeconomic History   Marital status: Legally Separated    Spouse name: Not on file   Number of children: Not on file   Years of education: Not on file   Highest education level: Not on file  Occupational History   Occupation: cook    Comment: lost job   Occupation: Biochemist, clinical    Comment: Updated June 2013   Occupation: [place]: Began Jan 2014  Tobacco Use   Smoking status: Never   Smokeless tobacco: Never  Vaping Use   Vaping Use: Never used  Substance and Sexual Activity   Alcohol use: No    Alcohol/week: 0.0 standard drinks   Drug use: No  Sexual activity: Yes    Partners: Female  Other Topics Concern   Not on file  Social History Narrative   Lives with his mother and brother.   Wife has had a foot amputation; is a type 1 DM.   Social Determinants of Health   Financial Resource Strain: Not on file  Food Insecurity: Not on file  Transportation Needs: Not on file  Physical Activity: Not on file  Stress: Not on file  Social Connections: Not on file  Intimate Partner Violence: Not on file     FAMILY HISTORY Family History  Problem Relation Age of Onset   Hypertension Mother 64   Cancer Mother        bladder cancer   Alcohol abuse Father    Hypertension Brother       Review of Systems: 12 systems reviewed Otherwise as per HPI, all other systems reviewed and negative  Physical Exam: Vitals:   02/18/21 0936 02/18/21 1200  BP: (!) 121/54 (!) 142/75  Pulse:    Resp:    Temp:    SpO2:     No intake/output data recorded.  Intake/Output Summary (Last 24 hours) at 02/18/2021 1224 Last data filed at 02/18/2021 0300 Gross per 24 hour  Intake 0 ml  Output 225 ml  Net -225 ml   General: well-appearing, no acute distress HEENT: anicteric sclera, oropharynx clear without lesions CV: regular rate,  normal rhythm, no murmurs, no gallops, no rubs, no peripheral edema Lungs: clear to auscultation bilaterally, normal work of breathing Abd: soft, non-tender, non-distended Skin: no visible lesions or rashes Psych: alert, engaged, appropriate mood and affect Musculoskeletal: no obvious deformities Neuro: normal speech, no gross focal deficits   Test Results Reviewed Lab Results  Component Value Date   NA 139 02/18/2021   K 3.6 02/18/2021   CL 109 02/18/2021   CO2 18 (L) 02/18/2021   BUN 51 (H) 02/18/2021   CREATININE 5.27 (H) 02/18/2021   CALCIUM 6.9 (L) 02/18/2021   ALBUMIN 1.9 (L) 02/17/2021   PHOS 4.9 (H) 02/17/2021     I have reviewed all relevant outside healthcare records related to the patient's current hospitalization

## 2021-02-18 NOTE — Progress Notes (Addendum)
PHARMACY - PHYSICIAN COMMUNICATION CRITICAL VALUE ALERT - BLOOD CULTURE IDENTIFICATION (BCID)  [name] is an 50 y.o. male who presented to [facility] on [facility]/17/2021 with a chief complaint of chest pain and dyspnea.  Assessment:  1 of 4 blood cultures, aerobic bottle, growing staph species, not staph aureus and no resistance detected. Patient is not on antibiotics but had a fever of 100.4 on 10/1, now afebrile, and WBC 17. MD agrees with holding antibiotics for now.   Name of physician (or Provider) Contacted: [name]  Current antibiotics: none  Changes to prescribed antibiotics recommended: none given possible contaminant. Continue to monitor cultures off antibiotics.  Patient is on recommended antibiotics - No changes needed    Benetta Spar, PharmD, BCPS, [facility] Clinical Pharmacist  Please check AMION for all [place] phone numbers After 10:00 PM, call [place] [phone]

## 2021-02-19 ENCOUNTER — Encounter (HOSPITAL_COMMUNITY): Payer: Self-pay

## 2021-02-19 DIAGNOSIS — I509 Heart failure, unspecified: Secondary | ICD-10-CM

## 2021-02-19 DIAGNOSIS — Z79899 Other long term (current) drug therapy: Secondary | ICD-10-CM

## 2021-02-19 DIAGNOSIS — E1122 Type 2 diabetes mellitus with diabetic chronic kidney disease: Secondary | ICD-10-CM

## 2021-02-19 DIAGNOSIS — N185 Chronic kidney disease, stage 5: Secondary | ICD-10-CM

## 2021-02-19 DIAGNOSIS — I132 Hypertensive heart and chronic kidney disease with heart failure and with stage 5 chronic kidney disease, or end stage renal disease: Secondary | ICD-10-CM

## 2021-02-19 DIAGNOSIS — Z794 Long term (current) use of insulin: Secondary | ICD-10-CM

## 2021-02-19 DIAGNOSIS — E785 Hyperlipidemia, unspecified: Secondary | ICD-10-CM

## 2021-02-19 LAB — CULTURE, BLOOD (ROUTINE X 2): Special Requests: ADEQUATE

## 2021-02-19 LAB — URINALYSIS, ROUTINE W REFLEX MICROSCOPIC
Bilirubin Urine: NEGATIVE
Glucose, UA: >=500 mg/dL — AB
Ketones, ur: NEGATIVE mg/dL
Leukocytes,Ua: NEGATIVE
Nitrite: NEGATIVE
Protein, ur: >=300 mg/dL — AB
Specific Gravity, Urine: 1.013 (ref 1.005–1.030)
pH: 5 (ref 5.0–8.0)

## 2021-02-19 LAB — CBC WITH DIFFERENTIAL/PLATELET
Abs Immature Granulocytes: 0.03 10*3/uL (ref 0.00–0.07)
Basophils Absolute: 0 10*3/uL (ref 0.0–0.1)
Basophils Relative: 0 %
Eosinophils Absolute: 0.3 10*3/uL (ref 0.0–0.5)
Eosinophils Relative: 3 %
HCT: 27 % — ABNORMAL LOW (ref 39.0–52.0)
Hemoglobin: 8.2 g/dL — ABNORMAL LOW (ref 13.0–17.0)
Immature Granulocytes: 0 %
Lymphocytes Relative: 14 %
Lymphs Abs: 1.5 10*3/uL (ref 0.7–4.0)
MCH: 26.7 pg (ref 26.0–34.0)
MCHC: 30.4 g/dL (ref 30.0–36.0)
MCV: 87.9 fL (ref 80.0–100.0)
Monocytes Absolute: 0.9 10*3/uL (ref 0.1–1.0)
Monocytes Relative: 8 %
Neutro Abs: 8.3 10*3/uL — ABNORMAL HIGH (ref 1.7–7.7)
Neutrophils Relative %: 75 %
Platelets: 276 10*3/uL (ref 150–400)
RBC: 3.07 MIL/uL — ABNORMAL LOW (ref 4.22–5.81)
RDW: 14 % (ref 11.5–15.5)
WBC: 11 10*3/uL — ABNORMAL HIGH (ref 4.0–10.5)
nRBC: 0 % (ref 0.0–0.2)

## 2021-02-19 LAB — BASIC METABOLIC PANEL
Anion gap: 9 (ref 5–15)
BUN: 59 mg/dL — ABNORMAL HIGH (ref 6–20)
CO2: 17 mmol/L — ABNORMAL LOW (ref 22–32)
Calcium: 6.2 mg/dL — CL (ref 8.9–10.3)
Chloride: 107 mmol/L (ref 98–111)
Creatinine, Ser: 6.38 mg/dL — ABNORMAL HIGH (ref 0.61–1.24)
GFR, Estimated: 10 mL/min — ABNORMAL LOW (ref >60)
Glucose, Bld: 102 mg/dL — ABNORMAL HIGH (ref 70–99)
Potassium: 4.1 mmol/L (ref 3.5–5.1)
Sodium: 133 mmol/L — ABNORMAL LOW (ref 135–145)

## 2021-02-19 LAB — GLUCOSE, CAPILLARY
Glucose-Capillary: 104 mg/dL — ABNORMAL HIGH (ref 70–99)
Glucose-Capillary: 112 mg/dL — ABNORMAL HIGH (ref 70–99)
Glucose-Capillary: 98 mg/dL (ref 70–99)
Glucose-Capillary: 136 mg/dL — ABNORMAL HIGH (ref 70–99)

## 2021-02-19 MED ORDER — CARVEDILOL 12.5 MG PO TABS
12.5000 mg | ORAL_TABLET | Freq: Two times a day (BID) | ORAL | Status: DC
  Administered 2021-02-19 – 2021-02-26 (×13): 12.5 mg via ORAL
  Filled 2021-02-19 (×13): qty 1

## 2021-02-19 MED ORDER — ATORVASTATIN CALCIUM 40 MG PO TABS
40.0000 mg | ORAL_TABLET | Freq: Every day | ORAL | Status: DC
  Administered 2021-02-19 – 2021-02-26 (×8): 40 mg via ORAL
  Filled 2021-02-19 (×8): qty 1

## 2021-02-19 MED ORDER — DARBEPOETIN ALFA 60 MCG/0.3ML IJ SOSY
60.0000 ug | PREFILLED_SYRINGE | INTRAMUSCULAR | Status: DC
  Filled 2021-02-19: qty 0.3

## 2021-02-19 MED ORDER — CHLORHEXIDINE GLUCONATE CLOTH 2 % EX PADS
6.0000 | MEDICATED_PAD | Freq: Every day | CUTANEOUS | Status: DC
  Administered 2021-02-19 – 2021-02-26 (×8): 6 via TOPICAL

## 2021-02-19 MED ORDER — PENTAFLUOROPROP-TETRAFLUOROETH EX AERO
1.0000 "application " | INHALATION_SPRAY | CUTANEOUS | Status: DC | PRN
  Filled 2021-02-19: qty 116

## 2021-02-19 MED ORDER — SODIUM CHLORIDE 0.9 % IV SOLN: 100.0000 mL | INTRAVENOUS | Status: DC | PRN

## 2021-02-19 MED ORDER — LIDOCAINE-PRILOCAINE 2.5-2.5 % EX CREA
1.0000 "application " | TOPICAL_CREAM | CUTANEOUS | Status: DC | PRN
  Filled 2021-02-19: qty 5

## 2021-02-19 MED ORDER — HYDRALAZINE HCL 25 MG PO TABS
25.0000 mg | ORAL_TABLET | Freq: Three times a day (TID) | ORAL | Status: DC
  Administered 2021-02-19 – 2021-02-26 (×20): 25 mg via ORAL
  Filled 2021-02-19 (×20): qty 1

## 2021-02-19 MED ORDER — CALCIUM CARBONATE ANTACID 500 MG PO CHEW
1.0000 | CHEWABLE_TABLET | Freq: Three times a day (TID) | ORAL | Status: AC
  Administered 2021-02-19 (×3): 200 mg via ORAL
  Filled 2021-02-19 (×3): qty 1

## 2021-02-19 MED ORDER — LIDOCAINE HCL (PF) 1 % IJ SOLN: 5.0000 mL | INTRAMUSCULAR | Status: DC | PRN

## 2021-02-19 MED ORDER — ALTEPLASE 2 MG IJ SOLR
2.0000 mg | Freq: Once | INTRAMUSCULAR | Status: DC | PRN
  Filled 2021-02-19: qty 2

## 2021-02-19 MED ORDER — KIDNEY FAILURE BOOK: Freq: Once | Status: AC

## 2021-02-19 MED ORDER — HEPARIN SODIUM (PORCINE) 1000 UNIT/ML DIALYSIS
1000.0000 [IU] | INTRAMUSCULAR | Status: DC | PRN
  Filled 2021-02-19 (×2): qty 1

## 2021-02-19 NOTE — Progress Notes (Signed)
Round on patient today in correlation of plan to start HD. Patient provided with handouts and booklet. Patient meal tray came into room and patient with plans to eat. Advised patient to begin reading. Plan to round on patient in the am.   Dorthey Sawyer, RN  Dialysis Nurse Coordinator [phone]

## 2021-02-19 NOTE — Progress Notes (Signed)
Requested to see pt for out-pt HD arrangements. Met with pt at bedside. Pt reports that he lives with mother and brother. Pt is unemployed and uninsured. Pt reports that he has a Chief Executive Officer assisting with SS disability and has been denied for medicaid in the past. Discussed my role. Pt prefers to remain with [facility] providers and would like a kidney center close to home. Due to pt's preference, will proceed with referral to [facility] admissions. Pt states that his brother may be able to assist with transportation to/from HD but will possibly need transportation resources at d/c. Will alert TOC staff. Will follow and assist with HD needs.  Melven Sartorius Renal Navigator [phone]

## 2021-02-19 NOTE — Progress Notes (Signed)
Heart Failure Navigator Progress Note  Assessed for Heart & Vascular TOC clinic readiness.  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   Navigator available for reassessment of patient.   Pricilla Holm, MSN, RN Heart Failure Nurse Navigator [phone]

## 2021-02-19 NOTE — H&P (View-Only) (Signed)
Hospital Consult    Reason for Consult:  TDC and permanent access Requesting Physician:  Dr. Kruska MRN #:  [id number]  History of Present Illness: This is a 50 y.o. male with past medical history significant for hypertension, hyperlipidemia, insulin-dependent diabetes mellitus, CHF.  He presented to the emergency department with chest pain and shortness of breath.  He was admitted for cardiac rule out and renal failure.  He is being seen in consultation for evaluation for tunneled dialysis catheter placement as well as permanent dialysis access.  He is left arm dominant and would prefer access placement in his right arm.  Prior surgery on right arm includes metal plate for fractured elbow per patient.  He is not on blood thinners.  He does not have a pacemaker.  Vein mapping is pending.  Past Medical History:  Diagnosis Date   Anxiety 02/2019   Anxiety 10/2019   Diabetes mellitus type II, uncontrolled (HCC) 07/17/2006   Elevated alkaline phosphatase level 01/2019   ESSENTIAL HYPERTENSION 07/17/2006   HYPERLIPIDEMIA 11/24/2008   Insomnia 02/2019   Left bundle branch block 02/22/2019   Lesion of lip 10/2019   Pneumonia 10/22/2011   Suicidal ideations 02/2019   Vitamin D deficiency 01/2019    Past Surgical History:  Procedure Laterality Date   ARM SURGERY      Allergies  Allergen Reactions   Sulfa Antibiotics Rash    Severe rash. Do not give sulfa medications   Bactrim [Sulfamethoxazole-Trimethoprim]     Rash   Cephalexin     Patient with severe drug reaction including exfoliating skin rash and hypotension after being prescribed both cephalexin and sulfamethoxazole-trimethoprim simultaneously. Favor SMX as much more likely culprit as patient had tolerated other cephalosporins in the past, but cannot say with complete certainty that this was not related to cephalexin.    Other     Patient states he is allergic to the TB test    Prior to Admission medications   Medication Sig  Start Date End Date Taking? Authorizing Provider  amLODipine (NORVASC) 10 MG tablet Take 1 tablet (10 mg total) by mouth daily. 02/22/20 02/11/22 Yes Wright, Patrick E, MD  atorvastatin (LIPITOR) 40 MG tablet Take 1 tablet (40 mg total) by mouth daily. 11/29/20  Yes Krishnan, Sendil K, MD  carvedilol (COREG) 25 MG tablet Take 1 tablet (25 mg total) by mouth 2 (two) times daily. 11/29/20  Yes Krishnan, Sendil K, MD  furosemide (LASIX) 40 MG tablet Take 1 tablet (40 mg total) by mouth daily. 11/29/20  Yes Krishnan, Sendil K, MD  hydrALAZINE (APRESOLINE) 25 MG tablet Take 1 tablet (25 mg total) by mouth 3 (three) times daily. 11/29/20  Yes Krishnan, Sendil K, MD  Blood Glucose Monitoring Suppl (ACCU-CHEK GUIDE) w/Device KIT Use as directed 11/29/20   Krishnan, Sendil K, MD  Blood Glucose Monitoring Suppl (TRUE METRIX METER) w/Device KIT Use to measure blood sugar twice a day 02/22/20   Wright, Patrick E, MD  glucose blood test strip Use to check fasting blood sugar once daily. 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 Insulin dependent 11/29/20   Krishnan, Sendil K, MD  insulin aspart (NOVOLOG FLEXPEN) 100 UNIT/ML FlexPen Inject 10 Units into the skin 2 (two) times daily with a meal. Patient not taking: Reported on 02/17/2021 11/29/20   Krishnan, Sendil K, MD  insulin detemir (LEVEMIR FLEXTOUCH) 100 UNIT/ML FlexPen Inject 35 Units into the skin daily. Patient not taking: Reported on 02/17/2021 11/29/20   Krishnan, Sendil K, MD  Insulin Pen Needle 32G   X 4 MM MISC Use with insulin pens 11/29/20   Krishnan, Sendil K, MD  TRUEplus Lancets 28G MISC USE TO MEASURE BLOOD SUGAR TWICE A DAY 11/29/20 11/29/21  Krishnan, Sendil K, MD  gabapentin (NEURONTIN) 300 MG capsule Take 1 capsule (300 mg total) by mouth 3 (three) times daily. Patient not taking: Reported on 07/27/2019 07/29/18 08/30/19  Stroud, Natalie M, FNP  sildenafil (VIAGRA) 25 MG tablet Take 1 tablet (25 mg total) by mouth as needed for erectile dysfunction. for erectile dysfunction. Patient  not taking: Reported on 08/11/2020 10/13/19 11/27/20  Stroud, Natalie M, FNP  sitaGLIPtin (JANUVIA) 25 MG tablet Take 1 tablet (25 mg total) by mouth daily. Patient not taking: Reported on 08/11/2020 02/22/20 11/27/20  Wright, Patrick E, MD    Social History   Socioeconomic History   Marital status: Legally Separated    Spouse name: Not on file   Number of children: Not on file   Years of education: Not on file   Highest education level: Not on file  Occupational History   Occupation: cook    Comment: lost job   Occupation: Warehouse worker    Comment: Updated June 2013   Occupation: [employer]    Comment: Began Jan 2014  Tobacco Use   Smoking status: Never   Smokeless tobacco: Never  Vaping Use   Vaping Use: Never used  Substance and Sexual Activity   Alcohol use: No    Alcohol/week: 0.0 standard drinks   Drug use: No   Sexual activity: Yes    Partners: Female  Other Topics Concern   Not on file  Social History Narrative   Lives with his mother and brother.   Wife has had a foot amputation; is a type 1 DM.   Social Determinants of Health   Financial Resource Strain: Not on file  Food Insecurity: Not on file  Transportation Needs: Not on file  Physical Activity: Not on file  Stress: Not on file  Social Connections: Not on file  Intimate Partner Violence: Not on file     Family History  Problem Relation Age of Onset   Hypertension Mother 55   Cancer Mother        bladder cancer   Alcohol abuse Father    Hypertension Brother     ROS: Otherwise negative unless mentioned in HPI  Physical Examination  Vitals:   02/19/21 0332 02/19/21 0741  BP: (!) 141/81 (!) 158/88  Pulse: 83   Resp: 18 16  Temp: 98.3 F (36.8 C) 98.1 F (36.7 C)  SpO2: 98%    Body mass index is 26.86 kg/m.  General:  WDWN in NAD Gait: Not observed HENT: WNL, normocephalic Pulmonary: normal non-labored breathing, without Rales, rhonchi,  wheezing Cardiac: regular Abdomen:  soft,  NT/ND, no masses Skin: without rashes Vascular Exam/Pulses: symmetrical radial pulses Extremities: without ischemic changes, without Gangrene , without cellulitis; without open wounds; BLE pitting edema Musculoskeletal: no muscle wasting or atrophy  Neurologic: A&O X 3;  No focal weakness or paresthesias are detected; speech is fluent/normal Psychiatric:  The pt has Normal affect. Lymph:  Unremarkable  CBC    Component Value Date/Time   WBC 11.0 (H) 02/19/2021 0018   RBC 3.07 (L) 02/19/2021 0018   HGB 8.2 (L) 02/19/2021 0018   HGB 13.2 05/09/2020 1455   HCT 27.0 (L) 02/19/2021 0018   HCT 41.5 05/09/2020 1455   PLT 276 02/19/2021 0018   PLT 418 05/09/2020 1455   MCV 87.9 02/19/2021   0018   MCV 84 05/09/2020 1455   MCH 26.7 02/19/2021 0018   MCHC 30.4 02/19/2021 0018   RDW 14.0 02/19/2021 0018   RDW 13.1 05/09/2020 1455   LYMPHSABS 1.5 02/19/2021 0018   LYMPHSABS 1.6 05/09/2020 1455   MONOABS 0.9 02/19/2021 0018   EOSABS 0.3 02/19/2021 0018   EOSABS 0.1 05/09/2020 1455   BASOSABS 0.0 02/19/2021 0018   BASOSABS 0.0 05/09/2020 1455    BMET    Component Value Date/Time   NA 133 (L) 02/19/2021 0018   NA 138 05/09/2020 1455   K 4.1 02/19/2021 0018   CL 107 02/19/2021 0018   CO2 17 (L) 02/19/2021 0018   GLUCOSE 102 (H) 02/19/2021 0018   BUN 59 (H) 02/19/2021 0018   BUN 26 (H) 05/09/2020 1455   CREATININE 6.38 (H) 02/19/2021 0018   CREATININE 0.93 09/06/2014 1657   CALCIUM 6.2 (LL) 02/19/2021 0018   GFRNONAA 10 (L) 02/19/2021 0018   GFRNONAA >89 09/06/2014 1657   GFRAA 37 (L) 05/09/2020 1455   GFRAA >89 09/06/2014 1657    COAGS: Lab Results  Component Value Date   INR 1.05 09/08/2012     Non-Invasive Vascular Imaging:   Vein mapping pending   ASSESSMENT/PLAN: This is a 50 y.o. male with CKD 5 progressing to end-stage renal disease  -Patient is left arm dominant and would prefer access placement in his right arm; restrict right upper extremity -Vein mapping  is pending -Patient will be scheduled for TDC placement and right arm AV fistula creation versus graft placement during his inpatient stay -On-call vascular surgeon Dr. Anne Boltz will evaluate the patient later today and provide further treatment details including timing of surgery   Matthew Eveland PA-C [facility] [phone]  I have independently interviewed and examined patient and agree with PA assessment and plan above.  Plan will be for right arm AV fistula versus graft and tunneled dialysis catheter placement tomorrow in the OR.  He will be n.p.o. past midnight.  Vein mapping pending we will determine possibility of fistula.  Lindsie Simar C. Mervin Ramires, MD [facility] Office: [phone] Pager: [phone]  

## 2021-02-19 NOTE — Plan of Care (Signed)
  Problem: Activity: Goal: Capacity to carry out activities will improve Outcome: Not Progressing   Problem: Clinical Measurements: Goal: Ability to maintain clinical measurements within normal limits will improve Outcome: Not Progressing Goal: Diagnostic test results will improve Outcome: Not Progressing

## 2021-02-19 NOTE — Progress Notes (Signed)
Mobility Specialist Progress Note:   02/19/21 1510  Mobility  Activity Refused mobility   Pt refused mobility d/t being "too tired". Encouraged to ambulate today after procedure.   [name] Mobility Specialist  Phone [phone]

## 2021-02-19 NOTE — Progress Notes (Signed)
PROGRESS NOTE    [name]  [id number] DOB: 03-22-1971 DOA: 02/17/2021 PCP: Vevelyn Francois, NP   Brief Narrative:  HPI: [name] is a 50 y.o. male with medical history significant of HTN, DM, HLD, chronic systolic CHF, medical noncompliance presenting to ED with chest pain that started a couple of weeks ago and has been intermittent in nature. He will be laying down when he has the pain. Pain located over left lateral chest wall, under left breast. Pain described as sharp in nature and rated 9/10. No radiation. No associated diaphoresis. He is unsure how long episodes of pain last for as he has it at night and falls asleep.  He doesn't have the pain with exertion, but will have shortness of breath. His shortness of breath has also been progressively getting worse over the last 1-2 moths. He has shortness of breath with resting and with exertion. He also has complaints of orthopnea and uses 2 pillows to sleep at night. He also has worsening bilateral lower edema. He also has a cough that started 3-4 months ago. He had an episode of vomiting x 1 today in ED. He denies any acute vision changes or headaches. He is urinating normally.    No fever/chills, dizziness, lightheadedness, abdominal pain, diarrhea, dysuria, rashes. Has blurry vision/decreased vision in his left eye that has been going on "for months."    Patient is poor historian    ED Course: vitals: afebrile, bp: 202/115, HR: 101, RR: 18, oxygen: 100%RA,  Pertinent labs: Hemoglobin 9.8, CO2 20, glucose 187, BUN 49, creatinine 5.22, albumin 1.9, BNP 1329, troponin 99-->105 Chest x-ray shows stable cardiomegaly and chronic diffuse pulmonary vascular congestion.  No evidence of frank pulmonary edema. In ED given 60 mg IV Lasix, 100 mg hydralazine tablet, and 20 mg labetalol injection.  TRH was called and asked to admit.  Assessment & Plan:   Active Problems:   Hyperlipidemia LDL goal <70   Stage 4 chronic kidney disease  ([place])   Hypertensive crisis   Acute combined systolic and diastolic congestive heart failure (HCC)   Type 2 diabetes mellitus ([place])   Hypertensive urgency  Chest pain: Currently chest pain-free.  No acute ST-T wave changes on EKG.  Troponin elevated but flat and in fact not too far away from his baseline creatinine since it has been elevated since last 2 to 3 years likely due to demand ischemia in combination of advanced CKD.  Dyspnea/chronic vascular congestion/acute on chronic combined diastolic and systolic congestive heart failure/progressive CKD stage V/non-anion gap metabolic acidosis: Echo done in July this year shows 30% ejection fraction with grade 1 diastolic dysfunction and left ventricular global hypokinesis.  No need to repeat echo.  Received 120 mg Lasix IV twice daily yesterday with only 400 cc urine output today.  Still has some crackles at the bases.  Having mild uremic symptoms.  Nephrology on board.  They have started him on Lasix and milligrams IV daily and they are planning to initiate dialysis here.  Patient agreeable for that.  Fever/leukocytosis: He developed fever of 100.4 and also developed leukocytosis of 17,000 on 02/18/2021.  Only one of the 4 blood culture sets is growing staph aureus, no MRSA.  Patient has remained afebrile since then.  Leukocytosis also improving.  UA negative and chest x-ray negative for infection.  No other indication of infection anywhere else.  Continue to monitor off antibiotics for now.  Hypertensive urgency: Presented with blood pressure of 202/115.  Currently  he is only on amlodipine and blood pressure is much better.  We will continue this and hopefully his dialysis will help as well.  Poorly controlled type 2 diabetes mellitus with hyperglycemia: Hemoglobin A1c 8.8.  He is supposed to be taking Lantus 35 units at home.  Currently on 20 units of Lantus and SSI and blood sugar controlled.  Continue this.   Hyperlipidemia: Continue  statin.  Anemia of chronic disease: Hemoglobin is stable.  Chronic visual deficit: Its been going on since 6 months according to H&P.  Patient did not mention anything to me about his vision.  Likely diabetic retinopathy in the face of being noncompliant.  Will need ophthalmology as outpatient.  Long history of noncompliance: Per nephrologist, patient has missed his several appointments with nephrology.  Counseling provided.  DVT prophylaxis: heparin injection 5,000 Units Start: 02/17/21 2200   Code Status: Full Code  Family Communication:  None present at bedside.  Plan of care discussed with patient in length and he verbalized understanding and agreed with it.  Status is: Observation  The patient will require care spanning > 2 midnights and should be moved to inpatient because: Inpatient level of care appropriate due to severity of illness  Dispo: The patient is from: Home              Anticipated d/c is to: Home              Patient currently is not medically stable to d/c.   Difficult to place patient No        Estimated body mass index is 26.86 kg/m as calculated from the following:   Height as of this encounter: '5\' 7"'$  (1.702 m).   Weight as of this encounter: 77.8 kg.     Nutritional Assessment: Body mass index is 26.86 kg/m.Marland Kitchen Seen by dietician.  I agree with the assessment and plan as outlined below: Nutrition Status:        .  Skin Assessment: I have examined the patient's skin and I agree with the wound assessment as performed by the wound care RN as outlined below:    Consultants:  Nephrology  Procedures:  None  Antimicrobials:  Anti-infectives (From admission, onward)    None          Subjective: Patient seen and examined.  He denies any chest pain or shortness of breath.  After long discussion, he is now agreeable for dialysis.  Objective: Vitals:   02/18/21 1917 02/19/21 0009 02/19/21 0332 02/19/21 0741  BP: (!) 143/83 117/63 (!)  141/81 (!) 158/88  Pulse: 90 85 83   Resp: '17 15 18 16  '$ Temp: 98.6 F (37 C) 98.7 F (37.1 C) 98.3 F (36.8 C) 98.1 F (36.7 C)  TempSrc: Oral Oral Oral   SpO2: 97% 96% 98%   Weight:   77.8 kg   Height:        Intake/Output Summary (Last 24 hours) at 02/19/2021 1043 Last data filed at 02/19/2021 0300 Gross per 24 hour  Intake 547.23 ml  Output 400 ml  Net 147.23 ml    Filed Weights   02/17/21 1455 02/19/21 0332  Weight: 63.5 kg 77.8 kg    Examination:  General exam: Appears calm and comfortable  Respiratory system: Faint bibasilar crackles, respiratory effort normal. Cardiovascular system: S1 & S2 heard, RRR. No JVD, murmurs, rubs, gallops or clicks.  +2 pitting edema bilateral lower extremity Gastrointestinal system: Abdomen is nondistended, soft and nontender. No organomegaly or masses felt.  Normal bowel sounds heard. Central nervous system: Alert and oriented. No focal neurological deficits. Extremities: Symmetric 5 x 5 power. Skin: No rashes, lesions or ulcers.  Psychiatry: Judgement and insight appear poor, mood and affect flat.   Data Reviewed: I have personally reviewed following labs and imaging studies  CBC: Recent Labs  Lab 02/17/21 1527 02/18/21 0024 02/19/21 0018  WBC 7.7 17.1* 11.0*  NEUTROABS  --   --  8.3*  HGB 9.8* 10.4* 8.2*  HCT 31.3* 32.6* 27.0*  MCV 86.9 85.1 87.9  PLT 350 347 [id number]    Basic Metabolic Panel: Recent Labs  Lab 02/17/21 1527 02/17/21 2221 02/18/21 0024 02/19/21 0018  NA 137  --  139 133*  K 4.4  --  3.6 4.1  CL 108  --  109 107  CO2 20*  --  18* 17*  GLUCOSE 187*  --  192* 102*  BUN 49*  --  51* 59*  CREATININE 5.22*  --  5.27* 6.38*  CALCIUM 7.0*  --  6.9* 6.2*  MG  --  1.5*  --   --   PHOS  --  4.9*  --   --     GFR: Estimated Creatinine Clearance: 13.4 mL/min (A) (by C-G formula based on SCr of 6.38 mg/dL (H)). Liver Function Tests: Recent Labs  Lab 02/17/21 1527  AST 20  ALT 13  ALKPHOS 105  BILITOT  0.4  PROT 5.5*  ALBUMIN 1.9*    No results for input(s): LIPASE, AMYLASE in the last 168 hours. No results for input(s): AMMONIA in the last 168 hours. Coagulation Profile: No results for input(s): INR, PROTIME in the last 168 hours. Cardiac Enzymes: No results for input(s): CKTOTAL, CKMB, CKMBINDEX, TROPONINI in the last 168 hours. BNP (last 3 results) No results for input(s): PROBNP in the last 8760 hours. HbA1C: Recent Labs    02/18/21 1043  HGBA1C 8.8*   CBG: Recent Labs  Lab 02/18/21 0625 02/18/21 1143 02/18/21 1629 02/18/21 2106 02/19/21 0608  GLUCAP 162* 108* 132* 136* 104*    Lipid Profile: Recent Labs    02/18/21 0024  CHOL 160  HDL 44  LDLCALC 98  TRIG 88  CHOLHDL 3.6    Thyroid Function Tests: No results for input(s): TSH, T4TOTAL, FREET4, T3FREE, THYROIDAB in the last 72 hours. Anemia Panel: Recent Labs    02/17/21 2221  FERRITIN 68  TIBC 277  IRON 23*    Sepsis Labs: No results for input(s): PROCALCITON, LATICACIDVEN in the last 168 hours.  Recent Results (from the past 240 hour(s))  Resp Panel by RT-PCR (Flu A&B, Covid) Nasopharyngeal Swab     Status: None   Collection Time: 02/17/21  4:45 PM   Specimen: Nasopharyngeal Swab; Nasopharyngeal(NP) swabs in vial transport medium  Result Value Ref Range Status   SARS Coronavirus 2 by RT PCR NEGATIVE NEGATIVE Final    Comment: (NOTE) SARS-CoV-2 target nucleic acids are NOT DETECTED.  The SARS-CoV-2 RNA is generally detectable in upper respiratory specimens during the acute phase of infection. The lowest concentration of SARS-CoV-2 viral copies this assay can detect is 138 copies/mL. A negative result does not preclude SARS-Cov-2 infection and should not be used as the sole basis for treatment or other patient management decisions. A negative result may occur with  improper specimen collection/handling, submission of specimen other than nasopharyngeal swab, presence of viral mutation(s)  within the areas targeted by this assay, and inadequate number of viral copies(<138 copies/mL). A negative result must be  combined with clinical observations, patient history, and epidemiological information. The expected result is Negative.  Fact Sheet for Patients:  EntrepreneurPulse.com.au  Fact Sheet for Healthcare Providers:  IncredibleEmployment.be  This test is no t yet approved or cleared by the [place] FDA and  has been authorized for detection and/or diagnosis of SARS-CoV-2 by FDA under an Emergency Use Authorization (EUA). This EUA will remain  in effect (meaning this test can be used) for the duration of the COVID-19 declaration under Section 564(b)(1) of the Act, 21 U.S.C.section 360bbb-3(b)(1), unless the authorization is terminated  or revoked sooner.       Influenza A by PCR NEGATIVE NEGATIVE Final   Influenza B by PCR NEGATIVE NEGATIVE Final    Comment: (NOTE) The Xpert Xpress SARS-CoV-2/FLU/RSV plus assay is intended as an aid in the diagnosis of influenza from Nasopharyngeal swab specimens and should not be used as a sole basis for treatment. Nasal washings and aspirates are unacceptable for Xpert Xpress SARS-CoV-2/FLU/RSV testing.  Fact Sheet for Patients: EntrepreneurPulse.com.au  Fact Sheet for Healthcare Providers: IncredibleEmployment.be  This test is not yet approved or cleared by the [place] FDA and has been authorized for detection and/or diagnosis of SARS-CoV-2 by FDA under an Emergency Use Authorization (EUA). This EUA will remain in effect (meaning this test can be used) for the duration of the COVID-19 declaration under Section 564(b)(1) of the Act, 21 U.S.C. section 360bbb-3(b)(1), unless the authorization is terminated or revoked.  Performed at [facility], [place] [address], [place], [place] [address]   MRSA Next Gen by PCR, Nasal     Status: None    Collection Time: 02/17/21 10:09 PM   Specimen: Nasal Mucosa; Nasal Swab  Result Value Ref Range Status   MRSA by PCR Next Gen NOT DETECTED NOT DETECTED Final    Comment: (NOTE) The GeneXpert MRSA Assay (FDA approved for NASAL specimens only), is one component of a comprehensive MRSA colonization surveillance program. It is not intended to diagnose MRSA infection nor to guide or monitor treatment for MRSA infections. Test performance is not FDA approved in patients less than 88 years old. Performed at [facility], [place] [address] [place], [place] [address]   Culture, blood (routine x 2)     Status: None (Preliminary result)   Collection Time: 02/18/21 12:24 AM   Specimen: BLOOD  Result Value Ref Range Status   Specimen Description BLOOD RIGHT ANTECUBITAL  Final   Special Requests   Final    BOTTLES DRAWN AEROBIC AND ANAEROBIC Blood Culture adequate volume   Culture   Final    NO GROWTH 1 DAY Performed at [facility], [place] [address] [place], [place] [address]    Report Status PENDING  Incomplete  Culture, blood (routine x 2)     Status: None (Preliminary result)   Collection Time: 02/18/21 12:24 AM   Specimen: BLOOD RIGHT HAND  Result Value Ref Range Status   Specimen Description BLOOD RIGHT HAND  Final   Special Requests   Final    BOTTLES DRAWN AEROBIC AND ANAEROBIC Blood Culture adequate volume   Culture  Setup Time   Final    GRAM POSITIVE COCCI AEROBIC BOTTLE ONLY CRITICAL RESULT CALLED TO, READ BACK BY AND VERIFIED WITH: L,CHEN PHARMD '@1840'$  02/18/21 EB Performed at [facility], [place] [address] [place],  [address]    Culture [facility] POSITIVE COCCI  Final   Report Status PENDING  Incomplete  Blood Culture  ID Panel (Reflexed)     Status: Abnormal   Collection Time: 02/18/21 12:24 AM  Result Value Ref Range Status   Enterococcus faecalis NOT DETECTED NOT DETECTED Final   Enterococcus Faecium NOT DETECTED NOT DETECTED Final   Listeria  monocytogenes NOT DETECTED NOT DETECTED Final   Staphylococcus species DETECTED (A) NOT DETECTED Final    Comment: CRITICAL RESULT CALLED TO, READ BACK BY AND VERIFIED WITH: L,CHEN PHARMD '@1840'$  02/18/21 EB    Staphylococcus aureus (BCID) NOT DETECTED NOT DETECTED Final   Staphylococcus epidermidis NOT DETECTED NOT DETECTED Final   Staphylococcus lugdunensis NOT DETECTED NOT DETECTED Final   Streptococcus species NOT DETECTED NOT DETECTED Final   Streptococcus agalactiae NOT DETECTED NOT DETECTED Final   Streptococcus pneumoniae NOT DETECTED NOT DETECTED Final   Streptococcus pyogenes NOT DETECTED NOT DETECTED Final   A.calcoaceticus-baumannii NOT DETECTED NOT DETECTED Final   Bacteroides fragilis NOT DETECTED NOT DETECTED Final   Enterobacterales NOT DETECTED NOT DETECTED Final   Enterobacter cloacae complex NOT DETECTED NOT DETECTED Final   Escherichia coli NOT DETECTED NOT DETECTED Final   Klebsiella aerogenes NOT DETECTED NOT DETECTED Final   Klebsiella oxytoca NOT DETECTED NOT DETECTED Final   Klebsiella pneumoniae NOT DETECTED NOT DETECTED Final   Proteus species NOT DETECTED NOT DETECTED Final   Salmonella species NOT DETECTED NOT DETECTED Final   Serratia marcescens NOT DETECTED NOT DETECTED Final   Haemophilus influenzae NOT DETECTED NOT DETECTED Final   Neisseria meningitidis NOT DETECTED NOT DETECTED Final   Pseudomonas aeruginosa NOT DETECTED NOT DETECTED Final   Stenotrophomonas maltophilia NOT DETECTED NOT DETECTED Final   Candida albicans NOT DETECTED NOT DETECTED Final   Candida auris NOT DETECTED NOT DETECTED Final   Candida glabrata NOT DETECTED NOT DETECTED Final   Candida krusei NOT DETECTED NOT DETECTED Final   Candida parapsilosis NOT DETECTED NOT DETECTED Final   Candida tropicalis NOT DETECTED NOT DETECTED Final   Cryptococcus neoformans/gattii NOT DETECTED NOT DETECTED Final    Comment: Performed at [facility], [address] [place], [place]  [address]       Radiology Studies: DG Chest Port 1 View  Result Date: 02/17/2021 CLINICAL DATA:  Chest pain and shortness of breath. EXAM: PORTABLE CHEST 1 VIEW COMPARISON:  11/27/2020 FINDINGS: Moderate cardiomegaly remains stable. Diffuse pulmonary vascular congestion is again demonstrated. No evidence of frank pulmonary edema or focal consolidation. No evidence of pleural effusion. IMPRESSION: Stable cardiomegaly and chronic diffuse pulmonary vascular congestion. Electronically Signed   By: Marlaine Hind M.D.   On: 02/17/2021 16:09    Scheduled Meds:  amLODipine  10 mg Oral Daily   aspirin EC  81 mg Oral Daily   atorvastatin  40 mg Oral Daily   calcium carbonate  1 tablet Oral TID WC   carvedilol  12.5 mg Oral BID WC   Chlorhexidine Gluconate Cloth  6 each Topical Q0600   [START ON 02/20/2021] darbepoetin (ARANESP) injection - DIALYSIS  60 mcg Intravenous Q Tue-HD   heparin  5,000 Units Subcutaneous Q8H   hydrALAZINE  25 mg Oral TID   insulin aspart  0-9 Units Subcutaneous TID WC   insulin detemir  20 Units Subcutaneous Daily   nitroGLYCERIN  0.4 mg Transdermal Daily   sodium chloride flush  3 mL Intravenous Q12H   Continuous Infusions:  sodium chloride 20 mL/hr at 02/17/21 1737   sodium chloride     ferric gluconate (FERRLECIT) IVPB 135 mL/hr at 02/18/21 2100  LOS: 1 day   Time spent: 30 minutes   [name], MD [facility]  02/19/2021, 10:43 AM  Please page via [place] and do not message via secure chat for anything urgent. Secure chat can be used for anything non urgent.  How to contact the [facility] Attending or Consulting provider [place] or covering provider during after hours [place], for this patient?  Check the care team in [facility] and look for a) attending/consulting TR[facility] provider listed and b) the [facility] team listed. Page or secure chat [phone]. Log into [email or web address] and use [place]'s universal password to access. If you do not have the password, please contact the  hospital operator. Locate the [facility] provider you are looking for under [facility] and page to a number that you can be directly reached. If you still have difficulty reaching the provider, please page the [facility] (Director on Call) for the Hospitalists listed on amion for assistance.

## 2021-02-19 NOTE — Consult Note (Addendum)
Hospital Consult    Reason for Consult:  [facility] and permanent access Requesting Physician:  Dr. Johnney Ou MRN #:  [id number]  History of Present Illness: This is a 50 y.o. male with past medical history significant for hypertension, hyperlipidemia, insulin-dependent diabetes mellitus, CHF.  He presented to the emergency department with chest pain and shortness of breath.  He was admitted for cardiac rule out and renal failure.  He is being seen in consultation for evaluation for tunneled dialysis catheter placement as well as permanent dialysis access.  He is left arm dominant and would prefer access placement in his right arm.  Prior surgery on right arm includes metal plate for fractured elbow per patient.  He is not on blood thinners.  He does not have a pacemaker.  Vein mapping is pending.  Past Medical History:  Diagnosis Date   Anxiety 02/2019   Anxiety 10/2019   Diabetes mellitus type II, uncontrolled ([place]) 07/17/2006   Elevated alkaline phosphatase level 01/2019   ESSENTIAL HYPERTENSION 07/17/2006   HYPERLIPIDEMIA 11/24/2008   Insomnia 02/2019   Left bundle branch block 02/22/2019   Lesion of lip 10/2019   Pneumonia 10/22/2011   Suicidal ideations 02/2019   Vitamin D deficiency 01/2019    Past Surgical History:  Procedure Laterality Date   ARM SURGERY      Allergies  Allergen Reactions   Sulfa Antibiotics Rash    Severe rash. Do not give sulfa medications   Bactrim [Sulfamethoxazole-Trimethoprim]     Rash   Cephalexin     Patient with severe drug reaction including exfoliating skin rash and hypotension after being prescribed both cephalexin and sulfamethoxazole-trimethoprim simultaneously. Favor SMX as much more likely culprit as patient had tolerated other cephalosporins in the past, but cannot say with complete certainty that this was not related to cephalexin.    Other     Patient states he is allergic to the TB test    Prior to Admission medications   Medication Sig  Start Date End Date Taking? Authorizing Provider  amLODipine (NORVASC) 10 MG tablet Take 1 tablet (10 mg total) by mouth daily. 02/22/20 02/11/22 Yes [name], MD  atorvastatin (LIPITOR) 40 MG tablet Take 1 tablet (40 mg total) by mouth daily. 11/29/20  Yes [name], MD  carvedilol (COREG) 25 MG tablet Take 1 tablet (25 mg total) by mouth 2 (two) times daily. 11/29/20  Yes [name], MD  furosemide (LASIX) 40 MG tablet Take 1 tablet (40 mg total) by mouth daily. 11/29/20  Yes [name], MD  hydrALAZINE (APRESOLINE) 25 MG tablet Take 1 tablet (25 mg total) by mouth 3 (three) times daily. 11/29/20  Yes [name], MD  Blood Glucose Monitoring Suppl (ACCU-CHEK GUIDE) w/Device KIT Use as directed 11/29/20   [name], MD  Blood Glucose Monitoring Suppl (TRUE METRIX METER) w/Device KIT Use to measure blood sugar twice a day 02/22/20   [name], MD  glucose blood test strip Use to check fasting blood sugar once daily. 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 Insulin dependent 11/29/20   [name], MD  insulin aspart (NOVOLOG FLEXPEN) 100 UNIT/ML FlexPen Inject 10 Units into the skin 2 (two) times daily with a meal. Patient not taking: Reported on 02/17/2021 11/29/20   [name], MD  insulin detemir (LEVEMIR FLEXTOUCH) 100 UNIT/ML FlexPen Inject 35 Units into the skin daily. Patient not taking: Reported on 02/17/2021 11/29/20   [name], MD  Insulin Pen Needle 32G  X 4 MM MISC Use with insulin pens 11/29/20   [name], MD  TRUEplus Lancets 28G MISC USE TO MEASURE BLOOD SUGAR TWICE A DAY 11/29/20 11/29/21  [name], MD  gabapentin (NEURONTIN) 300 MG capsule Take 1 capsule (300 mg total) by mouth 3 (three) times daily. Patient not taking: Reported on 07/27/2019 07/29/18 08/30/19  [name], FNP  sildenafil (VIAGRA) 25 MG tablet Take 1 tablet (25 mg total) by mouth as needed for erectile dysfunction. for erectile dysfunction. Patient  not taking: Reported on 08/11/2020 10/13/19 11/27/20  [name], FNP  sitaGLIPtin (JANUVIA) 25 MG tablet Take 1 tablet (25 mg total) by mouth daily. Patient not taking: Reported on 08/11/2020 02/22/20 11/27/20  [name], MD    Social History   Socioeconomic History   Marital status: Legally Separated    Spouse name: Not on file   Number of children: Not on file   Years of education: Not on file   Highest education level: Not on file  Occupational History   Occupation: cook    Comment: lost job   Occupation: Biochemist, clinical    Comment: Updated June 2013   Occupation: [place]: Began Jan 2014  Tobacco Use   Smoking status: Never   Smokeless tobacco: Never  Vaping Use   Vaping Use: Never used  Substance and Sexual Activity   Alcohol use: No    Alcohol/week: 0.0 standard drinks   Drug use: No   Sexual activity: Yes    Partners: Female  Other Topics Concern   Not on file  Social History Narrative   Lives with his mother and brother.   Wife has had a foot amputation; is a type 1 DM.   Social Determinants of Health   Financial Resource Strain: Not on file  Food Insecurity: Not on file  Transportation Needs: Not on file  Physical Activity: Not on file  Stress: Not on file  Social Connections: Not on file  Intimate Partner Violence: Not on file     Family History  Problem Relation Age of Onset   Hypertension Mother 20   Cancer Mother        bladder cancer   Alcohol abuse Father    Hypertension Brother     ROS: Otherwise negative unless mentioned in HPI  Physical Examination  Vitals:   02/19/21 0332 02/19/21 0741  BP: (!) 141/81 (!) 158/88  Pulse: 83   Resp: 18 16  Temp: 98.3 F (36.8 C) 98.1 F (36.7 C)  SpO2: 98%    Body mass index is 26.86 kg/m.  General:  WDWN in NAD Gait: Not observed HENT: WNL, normocephalic Pulmonary: normal non-labored breathing, without Rales, rhonchi,  wheezing Cardiac: regular Abdomen:  soft,  NT/ND, no masses Skin: without rashes Vascular Exam/Pulses: symmetrical radial pulses Extremities: without ischemic changes, without Gangrene , without cellulitis; without open wounds; BLE pitting edema Musculoskeletal: no muscle wasting or atrophy  Neurologic: A&O X 3;  No focal weakness or paresthesias are detected; speech is fluent/normal Psychiatric:  The pt has Normal affect. Lymph:  Unremarkable  CBC    Component Value Date/Time   WBC 11.0 (H) 02/19/2021 0018   RBC 3.07 (L) 02/19/2021 0018   HGB 8.2 (L) 02/19/2021 0018   HGB 13.2 05/09/2020 1455   HCT 27.0 (L) 02/19/2021 0018   HCT 41.5 05/09/2020 1455   PLT 276 02/19/2021 0018   PLT 418 05/09/2020 1455   MCV 87.9 02/19/2021  0018   MCV 84 05/09/2020 1455   MCH 26.7 02/19/2021 0018   MCHC 30.4 02/19/2021 0018   RDW 14.0 02/19/2021 0018   RDW 13.1 05/09/2020 1455   LYMPHSABS 1.5 02/19/2021 0018   LYMPHSABS 1.6 05/09/2020 1455   MONOABS 0.9 02/19/2021 0018   EOSABS 0.3 02/19/2021 0018   EOSABS 0.1 05/09/2020 1455   BASOSABS 0.0 02/19/2021 0018   BASOSABS 0.0 05/09/2020 1455    BMET    Component Value Date/Time   NA 133 (L) 02/19/2021 0018   NA 138 05/09/2020 1455   K 4.1 02/19/2021 0018   CL 107 02/19/2021 0018   CO2 17 (L) 02/19/2021 0018   GLUCOSE 102 (H) 02/19/2021 0018   BUN 59 (H) 02/19/2021 0018   BUN 26 (H) 05/09/2020 1455   CREATININE 6.38 (H) 02/19/2021 0018   CREATININE 0.93 09/06/2014 1657   CALCIUM 6.2 (LL) 02/19/2021 0018   GFRNONAA 10 (L) 02/19/2021 0018   GFRNONAA >89 09/06/2014 1657   GFRAA 37 (L) 05/09/2020 1455   GFRAA >89 09/06/2014 1657    COAGS: Lab Results  Component Value Date   INR 1.05 09/08/2012     Non-Invasive Vascular Imaging:   Vein mapping pending   ASSESSMENT/PLAN: This is a 50 y.o. 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 Donzetta Matters will evaluate the patient later today and provide further treatment details including timing of surgery   Dagoberto Ligas PA-C [facility] [phone]  I have independently interviewed and examined patient and agree with PA assessment and plan above.  Plan will be for right arm AV fistula versus graft and tunneled dialysis catheter placement tomorrow in the OR.  He will be n.p.o. past midnight.  Vein mapping pending we will determine possibility of fistula.  Traeh Milroy C. Donzetta Matters, MD [facility] [place] Office: [phone] Pager: [phone]

## 2021-02-19 NOTE — TOC Initial Note (Addendum)
Transition of Care [facility]) - Initial/Assessment Note    Patient Details  Name: NEILSON MELDE MRN: [id number] Date of Birth: 31-May-1970  Transition of Care [facility]) CM/SW Contact:    Zenon Mayo, RN Phone Number: 02/19/2021, 3:55 PM  Clinical Narrative:                 NCM spoke with patient,  he lives with his brother, he has PCP, Dionisio David at the [facility] [place] [facility] , he states he has a follow up apt coming up on 10/17.   He will be a new HD patient, being clipped. He states he will have transportation at dc. He may need ast with medications at dc,but if being clipped may be getting emergency Medicare. TOC will continue to follow for dc needs. If brother unable to transport to HD he may need resources.  CSW will follow up with patient about transportation resources.  Expected Discharge Plan: Home/Self Care Barriers to Discharge: Continued Medical Work up   Patient Goals and CMS Choice Patient states their goals for this hospitalization and ongoing recovery are:: return home   Choice offered to / list presented to : NA  Expected Discharge Plan and Services Expected Discharge Plan: Home/Self Care   Discharge Planning Services: CM Consult Post Acute Care Choice: NA Living arrangements for the past 2 months: Single Family Home                   DME Agency: NA       HH Arranged: NA          Prior Living Arrangements/Services Living arrangements for the past 2 months: Single Family Home Lives with:: Adult Children Patient language and need for interpreter reviewed:: Yes Do you feel safe going back to the place where you live?: Yes      Need for Family Participation in Patient Care: Yes (Comment) Care giver support system in place?: Yes (comment)   Criminal Activity/Legal Involvement Pertinent to Current Situation/Hospitalization: No - Comment as needed  Activities of Daily Living      Permission Sought/Granted                   Emotional Assessment   Attitude/Demeanor/Rapport: Engaged Affect (typically observed): Appropriate Orientation: : Oriented to Place, Oriented to  Time, Oriented to Situation, Oriented to Self Alcohol / Substance Use: Not Applicable Psych Involvement: No (comment)  Admission diagnosis:  Hypertensive crisis [I16.9] Precordial chest pain [R07.2] CKD (chronic kidney disease) stage 5, GFR less than 15 ml/min (HCC) [N18.5] Elevated troponin [R77.8] Uncontrolled hypertension [I10] Non compliance w medication regimen [Z91.14] Acute on chronic combined systolic and diastolic CHF (congestive heart failure) ([place]) [I50.43] Hypertensive urgency [I16.0] Patient Active Problem List   Diagnosis Date Noted   Hypertensive urgency 02/18/2021   Type 2 diabetes mellitus ([place]) 02/17/2021   Acute combined systolic and diastolic congestive heart failure ([place]) 11/29/2020   Hypertensive crisis 11/27/2020   Lower extremity edema 03/07/2020   Stage 4 chronic kidney disease ([place]) 02/22/2020   Left ventricular dysfunction 04/27/2019   Hemoglobin A1C greater than 9%, indicating poor diabetic control 07/29/2018   New onset left bundle branch block (LBBB)    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 [place] [address] Phone: [phone] Fax: [phone]  [place]), [place] - [address] [place] [address] Phone: [phone] Fax: [place] [address] [place] [address] Phone: [phone] Fax: [phone]     Social Determinants of Health (SDOH) Interventions    Readmission Risk  Interventions Readmission Risk Prevention Plan 02/19/2021  Transportation Screening Complete  Medication Review ([place]) Complete  PCP or Specialist appointment within 3-5 days of discharge Complete  HRI or [place] Complete  SW Recovery Care/Counseling Consult Complete  [place] Not Applicable  Some recent data might be hidden

## 2021-02-19 NOTE — Progress Notes (Addendum)
[facility] Progress Note   Subjective:   Overall feeing ok.  UOP 433m yesterday.  Reporting hiccups and some mental fogginess.   Agreeable to HD.    Objective Vitals:   02/18/21 1917 02/19/21 0009 02/19/21 0332 02/19/21 0741  BP: (!) 143/83 117/63 (!) 141/81 (!) 158/88  Pulse: 90 85 83   Resp: '17 15 18 16  '$ Temp: 98.6 F (37 C) 98.7 F (37.1 C) 98.3 F (36.8 C) 98.1 F (36.7 C)  TempSrc: Oral Oral Oral   SpO2: 97% 96% 98%   Weight:   77.8 kg   Height:       Physical Exam General: nontoxic lying in bed Heart: RRR Lungs: clear, normal WOB Abdomen: soft, nontender Extremities: no edema Dialysis Access: none  Additional Objective Labs: Basic Metabolic Panel: Recent Labs  Lab 02/17/21 1527 02/17/21 2221 02/18/21 0024 02/19/21 0018  NA 137  --  139 133*  K 4.4  --  3.6 4.1  CL 108  --  109 107  CO2 20*  --  18* 17*  GLUCOSE 187*  --  192* 102*  BUN 49*  --  51* 59*  CREATININE 5.22*  --  5.27* 6.38*  CALCIUM 7.0*  --  6.9* 6.2*  PHOS  --  4.9*  --   --    Liver Function Tests: Recent Labs  Lab 02/17/21 1527  AST 20  ALT 13  ALKPHOS 105  BILITOT 0.4  PROT 5.5*  ALBUMIN 1.9*   No results for input(s): LIPASE, AMYLASE in the last 168 hours. CBC: Recent Labs  Lab 02/17/21 1527 02/18/21 0024 02/19/21 0018  WBC 7.7 17.1* 11.0*  NEUTROABS  --   --  8.3*  HGB 9.8* 10.4* 8.2*  HCT 31.3* 32.6* 27.0*  MCV 86.9 85.1 87.9  PLT 350 347 276   Blood Culture    Component Value Date/Time   SDES BLOOD RIGHT ANTECUBITAL 02/18/2021 0024   SDES BLOOD RIGHT HAND 02/18/2021 0024   SPECREQUEST  02/18/2021 0024    BOTTLES DRAWN AEROBIC AND ANAEROBIC Blood Culture adequate volume   SPECREQUEST  02/18/2021 0024    BOTTLES DRAWN AEROBIC AND ANAEROBIC Blood Culture adequate volume   CULT  02/18/2021 0024    NO GROWTH 1 DAY Performed at [id number] [place], [place] [address]   CULT GRAM [id number]/06/2020 0024    REPTSTATUS PENDING 02/18/2021 0024   REPTSTATUS PENDING 02/18/2021 0024    Cardiac Enzymes: No results for input(s): CKTOTAL, CKMB, CKMBINDEX, TROPONINI in the last 168 hours. CBG: Recent Labs  Lab 02/18/21 0625 02/18/21 1143 02/18/21 1629 02/18/21 2106 02/19/21 0608  GLUCAP 162* 108* 132* 136* 104*   Iron Studies:  Recent Labs    02/17/21 2221  IRON 23*  TIBC 277  FERRITIN 68   '@lablastinr3'$ @ Studies/Results: DG Chest Port 1 View  Result Date: 02/17/2021 CLINICAL DATA:  Chest pain and shortness of breath. EXAM: PORTABLE CHEST 1 VIEW COMPARISON:  11/27/2020 FINDINGS: Moderate cardiomegaly remains stable. Diffuse pulmonary vascular congestion is again demonstrated. No evidence of frank pulmonary edema or focal consolidation. No evidence of pleural effusion. IMPRESSION: Stable cardiomegaly and chronic diffuse pulmonary vascular congestion. Electronically Signed   By: JMarlaine HindM.D.   On: 02/17/2021 16:09   Medications:  sodium chloride 20 mL/hr at 02/17/21 1737   sodium chloride     ferric gluconate (FERRLECIT) IVPB 135 mL/hr at 02/18/21 2100   furosemide 120 mg (02/19/21 0938)  amLODipine  10 mg Oral Daily   aspirin EC  81 mg Oral Daily   atorvastatin  40 mg Oral Daily   calcium carbonate  1 tablet Oral TID WC   carvedilol  12.5 mg Oral BID WC   heparin  5,000 Units Subcutaneous Q8H   hydrALAZINE  25 mg Oral TID   insulin aspart  0-9 Units Subcutaneous TID WC   insulin detemir  20 Units Subcutaneous Daily   nitroGLYCERIN  0.4 mg Transdermal Daily   sodium chloride flush  3 mL Intravenous Q12H     Assessment/Recommendations: [name] is a/an 50 y.o. male with a past medical history HTN, DM2, HLD, HFrEF (EF 30 to 35% in July 2022), CKD who present w/ chest pain and progressive CKD   CKD V now with uremic symptoms > progressed to ESRD:   Having low grade uremic symptoms now and on history of poor adherence/follow up he's agreeable to starting dialysis while in  hospital.   -Vein map and consult VVS for perm access and catheter - will start HD thereafter -CLIP to outpt unit -Continue to monitor daily Cr, Dose meds for ESRD -Monitor Daily I/Os, Daily weight  -Maintain MAP>65 for optimal renal perfusion.  -Avoid nephrotoxic medications including NSAIDs and Vanc/Zosyn combo -Not concerning for obstruction at this time    HFrEF: History of heart failure with EF of 30 to 35% in July 2022.  May benefit from cardiology evaluation to help optimize medications although options likely limited due to his severe CKD. -Hypertension management as below -Likely only mild volume overload; Lasix 120 mg x 2 - had a dose this AM at 9:30, will order torsemide 100 daily   Hypertension: Currently on amlodipine and coreg.     Fever: had a fever to 100.4 sat AM but none since; WBC was 17 now 11. No antibiotics.  1 of 2 blood cultures with staph on probe - full culture pending; ? Contaminant as he's defervesced and leukocytosis resolved without intervention.    UA not c/w infection.   Anemia due to CKD: Hemoglobin 10.4 > 8.2.  No gross bleeding noted.  Iron sat 8 and ferritin 68. IV iron '250mg'$  daily x4 doses. Start ESA with HD. Continue to monitor.    Uncontrolled Diabetes Mellitus Type 2 with Hyperglycemia: Management per primary   Metabolic acidosis: Bicarb 18.  Mild and likely related to CKD.  Will correct with HD.   [name] MD 02/19/2021, 9:51 AM  [facility] Pager: [phone]

## 2021-02-20 ENCOUNTER — Encounter (HOSPITAL_COMMUNITY)
Admission: EM | Disposition: A | Discharge status: Home / Self Care | Payer: Self-pay | Source: Home / Self Care | Attending: Family Medicine

## 2021-02-20 ENCOUNTER — Inpatient Hospital Stay (HOSPITAL_COMMUNITY): Payer: Self-pay

## 2021-02-20 ENCOUNTER — Inpatient Hospital Stay (HOSPITAL_COMMUNITY): Payer: Self-pay | Admitting: Anesthesiology

## 2021-02-20 ENCOUNTER — Encounter (HOSPITAL_COMMUNITY): Payer: Self-pay | Admitting: Family Medicine

## 2021-02-20 DIAGNOSIS — N185 Chronic kidney disease, stage 5: Secondary | ICD-10-CM

## 2021-02-20 HISTORY — PX: INSERTION OF DIALYSIS CATHETER: SHX1324

## 2021-02-20 HISTORY — PX: ULTRASOUND GUIDANCE FOR VASCULAR ACCESS: SHX6516

## 2021-02-20 HISTORY — PX: AV FISTULA PLACEMENT: SHX1204

## 2021-02-20 LAB — GLUCOSE, CAPILLARY
Glucose-Capillary: 76 mg/dL (ref 70–99)
Glucose-Capillary: 118 mg/dL — ABNORMAL HIGH (ref 70–99)
Glucose-Capillary: 82 mg/dL (ref 70–99)
Glucose-Capillary: 114 mg/dL — ABNORMAL HIGH (ref 70–99)
Glucose-Capillary: 94 mg/dL (ref 70–99)
Glucose-Capillary: 154 mg/dL — ABNORMAL HIGH (ref 70–99)

## 2021-02-20 LAB — BASIC METABOLIC PANEL
Anion gap: 10 (ref 5–15)
BUN: 60 mg/dL — ABNORMAL HIGH (ref 6–20)
CO2: 21 mmol/L — ABNORMAL LOW (ref 22–32)
Calcium: 7.1 mg/dL — ABNORMAL LOW (ref 8.9–10.3)
Chloride: 105 mmol/L (ref 98–111)
Creatinine, Ser: 6.92 mg/dL — ABNORMAL HIGH (ref 0.61–1.24)
GFR, Estimated: 9 mL/min — ABNORMAL LOW (ref >60)
Glucose, Bld: 91 mg/dL (ref 70–99)
Potassium: 3.5 mmol/L (ref 3.5–5.1)
Sodium: 136 mmol/L (ref 135–145)

## 2021-02-20 SURGERY — ARTERIOVENOUS (AV) FISTULA CREATION
Anesthesia: General | Site: Chest | Laterality: Right

## 2021-02-20 MED ORDER — FENTANYL CITRATE (PF) 250 MCG/5ML IJ SOLN
INTRAMUSCULAR | Status: AC
  Filled 2021-02-20: qty 5

## 2021-02-20 MED ORDER — CHLORHEXIDINE GLUCONATE 0.12 % MT SOLN: 15.0000 mL | Freq: Once | OROMUCOSAL | Status: AC

## 2021-02-20 MED ORDER — 0.9 % SODIUM CHLORIDE (POUR BTL) OPTIME
TOPICAL | Status: DC | PRN
  Administered 2021-02-20: 1000 mL

## 2021-02-20 MED ORDER — ONDANSETRON HCL 4 MG/2ML IJ SOLN: 4.0000 mg | Freq: Once | INTRAMUSCULAR | Status: DC | PRN

## 2021-02-20 MED ORDER — MIDAZOLAM HCL 5 MG/5ML IJ SOLN
INTRAMUSCULAR | Status: DC | PRN
  Administered 2021-02-20: 2 mg via INTRAVENOUS

## 2021-02-20 MED ORDER — LIDOCAINE HCL (PF) 1 % IJ SOLN
INTRAMUSCULAR | Status: AC
  Filled 2021-02-20: qty 30

## 2021-02-20 MED ORDER — ONDANSETRON HCL 4 MG/2ML IJ SOLN
INTRAMUSCULAR | Status: DC | PRN
  Administered 2021-02-20: 4 mg via INTRAVENOUS

## 2021-02-20 MED ORDER — DEXAMETHASONE SODIUM PHOSPHATE 10 MG/ML IJ SOLN
INTRAMUSCULAR | Status: AC
  Filled 2021-02-20: qty 1

## 2021-02-20 MED ORDER — FENTANYL CITRATE (PF) 100 MCG/2ML IJ SOLN
INTRAMUSCULAR | Status: AC
  Filled 2021-02-20: qty 2

## 2021-02-20 MED ORDER — ORAL CARE MOUTH RINSE: 15.0000 mL | Freq: Once | OROMUCOSAL | Status: AC

## 2021-02-20 MED ORDER — PROTAMINE SULFATE 10 MG/ML IV SOLN
INTRAVENOUS | Status: DC | PRN
  Administered 2021-02-20: 40 mg via INTRAVENOUS

## 2021-02-20 MED ORDER — DEXTROSE 50 % IV SOLN
INTRAVENOUS | Status: AC
  Filled 2021-02-20: qty 50

## 2021-02-20 MED ORDER — PAPAVERINE HCL 30 MG/ML IJ SOLN
INTRAMUSCULAR | Status: DC | PRN
  Administered 2021-02-20: 60 mg via INTRAVENOUS

## 2021-02-20 MED ORDER — OXYCODONE HCL 5 MG PO TABS: 5.0000 mg | ORAL_TABLET | Freq: Once | ORAL | Status: DC | PRN

## 2021-02-20 MED ORDER — PROTAMINE SULFATE 10 MG/ML IV SOLN
INTRAVENOUS | Status: AC
  Filled 2021-02-20: qty 5

## 2021-02-20 MED ORDER — VANCOMYCIN HCL 1000 MG IV SOLR
INTRAVENOUS | Status: DC | PRN
  Administered 2021-02-20: 1000 mg via INTRAVENOUS

## 2021-02-20 MED ORDER — PAPAVERINE HCL 30 MG/ML IJ SOLN
INTRAMUSCULAR | Status: AC
  Filled 2021-02-20: qty 2

## 2021-02-20 MED ORDER — FENTANYL CITRATE (PF) 250 MCG/5ML IJ SOLN
INTRAMUSCULAR | Status: DC | PRN
  Administered 2021-02-20: 50 ug via INTRAVENOUS

## 2021-02-20 MED ORDER — HEPARIN 6000 UNIT IRRIGATION SOLUTION
Status: AC
  Filled 2021-02-20: qty 500

## 2021-02-20 MED ORDER — LIDOCAINE 2% (20 MG/ML) 5 ML SYRINGE
INTRAMUSCULAR | Status: DC | PRN
  Administered 2021-02-20: 40 mg via INTRAVENOUS

## 2021-02-20 MED ORDER — DEXTROSE 50 % IV SOLN
25.0000 mL | Freq: Once | INTRAVENOUS | Status: AC
  Administered 2021-02-20: 25 mL via INTRAVENOUS

## 2021-02-20 MED ORDER — OXYCODONE HCL 5 MG/5ML PO SOLN
5.0000 mg | Freq: Once | ORAL | Status: DC | PRN
Start: 2021-02-20 — End: 2021-02-20

## 2021-02-20 MED ORDER — HEPARIN SODIUM (PORCINE) 1000 UNIT/ML IJ SOLN
INTRAMUSCULAR | Status: DC | PRN
  Administered 2021-02-20: 4000 [IU]

## 2021-02-20 MED ORDER — SODIUM CHLORIDE 0.9 % IV SOLN: INTRAVENOUS | Status: DC

## 2021-02-20 MED ORDER — CALCITRIOL 0.25 MCG PO CAPS
0.2500 ug | ORAL_CAPSULE | Freq: Every day | ORAL | Status: DC
  Administered 2021-02-20 – 2021-02-26 (×8): 0.25 ug via ORAL
  Filled 2021-02-20 (×8): qty 1

## 2021-02-20 MED ORDER — HEPARIN SODIUM (PORCINE) 1000 UNIT/ML IJ SOLN
INTRAMUSCULAR | Status: AC
  Filled 2021-02-20: qty 1

## 2021-02-20 MED ORDER — ONDANSETRON HCL 4 MG/2ML IJ SOLN
INTRAMUSCULAR | Status: AC
  Filled 2021-02-20: qty 2

## 2021-02-20 MED ORDER — HEPARIN 6000 UNIT IRRIGATION SOLUTION
Status: DC | PRN
  Administered 2021-02-20: 1

## 2021-02-20 MED ORDER — LIDOCAINE-EPINEPHRINE (PF) 1 %-1:200000 IJ SOLN
INTRAMUSCULAR | Status: AC
  Filled 2021-02-20: qty 30

## 2021-02-20 MED ORDER — VANCOMYCIN HCL IN DEXTROSE 1-5 GM/200ML-% IV SOLN
1000.0000 mg | INTRAVENOUS | Status: DC
  Filled 2021-02-20: qty 200

## 2021-02-20 MED ORDER — MIDAZOLAM HCL 2 MG/2ML IJ SOLN
INTRAMUSCULAR | Status: AC
  Filled 2021-02-20: qty 2

## 2021-02-20 MED ORDER — PHENYLEPHRINE 40 MCG/ML (10ML) SYRINGE FOR IV PUSH (FOR BLOOD PRESSURE SUPPORT)
PREFILLED_SYRINGE | INTRAVENOUS | Status: DC | PRN
  Administered 2021-02-20: 40 ug via INTRAVENOUS
  Administered 2021-02-20: 80 ug via INTRAVENOUS
  Administered 2021-02-20 (×3): 40 ug via INTRAVENOUS

## 2021-02-20 MED ORDER — LIDOCAINE 2% (20 MG/ML) 5 ML SYRINGE
INTRAMUSCULAR | Status: AC
  Filled 2021-02-20: qty 5

## 2021-02-20 MED ORDER — HEPARIN SODIUM (PORCINE) 1000 UNIT/ML IJ SOLN
INTRAMUSCULAR | Status: DC | PRN
  Administered 2021-02-20: 7000 [IU] via INTRAVENOUS

## 2021-02-20 MED ORDER — FENTANYL CITRATE (PF) 100 MCG/2ML IJ SOLN
25.0000 ug | INTRAMUSCULAR | Status: DC | PRN
  Administered 2021-02-20: 25 ug via INTRAVENOUS

## 2021-02-20 MED ORDER — LIDOCAINE-EPINEPHRINE (PF) 1 %-1:200000 IJ SOLN
INTRAMUSCULAR | Status: DC | PRN
  Administered 2021-02-20: 30 mL
  Administered 2021-02-20: 10 mL

## 2021-02-20 MED ORDER — TORSEMIDE 100 MG PO TABS
100.0000 mg | ORAL_TABLET | Freq: Every day | ORAL | Status: DC
  Administered 2021-02-20 – 2021-02-22 (×3): 100 mg via ORAL
  Filled 2021-02-20: qty 5
  Filled 2021-02-20: qty 1
  Filled 2021-02-20 (×2): qty 5

## 2021-02-20 MED ORDER — PHENYLEPHRINE HCL-NACL 20-0.9 MG/250ML-% IV SOLN
INTRAVENOUS | Status: DC | PRN
  Administered 2021-02-20: 100 ug/min via INTRAVENOUS

## 2021-02-20 MED ORDER — PROPOFOL 10 MG/ML IV BOLUS
INTRAVENOUS | Status: AC
  Filled 2021-02-20: qty 20

## 2021-02-20 MED ORDER — PROPOFOL 10 MG/ML IV BOLUS
INTRAVENOUS | Status: DC | PRN
  Administered 2021-02-20: 120 mg via INTRAVENOUS

## 2021-02-20 MED ORDER — CHLORHEXIDINE GLUCONATE 0.12 % MT SOLN
OROMUCOSAL | Status: AC
  Administered 2021-02-20: 15 mL via OROMUCOSAL
  Filled 2021-02-20: qty 15

## 2021-02-20 SURGICAL SUPPLY — 60 items
ADH SKN CLS APL DERMABOND .7 (GAUZE/BANDAGES/DRESSINGS) ×6
ARMBAND PINK RESTRICT EXTREMIT (MISCELLANEOUS) ×4 IMPLANT
BAG COUNTER SPONGE SURGICOUNT (BAG) ×4 IMPLANT
BAG DECANTER FOR FLEXI CONT (MISCELLANEOUS) ×4 IMPLANT
BAG SPNG CNTER NS LX DISP (BAG) ×3
BIOPATCH RED 1 DISK 7.0 (GAUZE/BANDAGES/DRESSINGS) ×4 IMPLANT
CANISTER SUCT 3000ML PPV (MISCELLANEOUS) ×4 IMPLANT
CATH PALINDROME-P 19CM W/VT (CATHETERS) IMPLANT
CATH PALINDROME-P 23CM W/VT (CATHETERS) ×1 IMPLANT
CATH PALINDROME-P 28CM W/VT (CATHETERS) IMPLANT
CLIP LIGATING EXTRA MED SLVR (CLIP) ×3 IMPLANT
CLIP LIGATING EXTRA SM BLUE (MISCELLANEOUS) ×3 IMPLANT
CLIP VESOCCLUDE MED 6/CT (CLIP) ×4 IMPLANT
CLIP VESOCCLUDE SM WIDE 6/CT (CLIP) ×4 IMPLANT
COVER PROBE W GEL 5X96 (DRAPES) ×5 IMPLANT
COVER SURGICAL LIGHT HANDLE (MISCELLANEOUS) ×4 IMPLANT
DERMABOND ADVANCED (GAUZE/BANDAGES/DRESSINGS) ×2
DERMABOND ADVANCED .7 DNX12 (GAUZE/BANDAGES/DRESSINGS) ×3 IMPLANT
DRAPE C-ARM 42X72 X-RAY (DRAPES) ×5 IMPLANT
DRAPE CHEST BREAST 15X10 FENES (DRAPES) ×5 IMPLANT
DRAPE ORTHO SPLIT 77X108 STRL (DRAPES) ×4
DRAPE SURG ORHT 6 SPLT 77X108 (DRAPES) IMPLANT
ELECT REM PT RETURN 9FT ADLT (ELECTROSURGICAL) ×4
ELECTRODE REM PT RTRN 9FT ADLT (ELECTROSURGICAL) ×3 IMPLANT
GAUZE 4X4 16PLY ~~LOC~~+RFID DBL (SPONGE) ×5 IMPLANT
GAUZE SPONGE 4X4 12PLY STRL (GAUZE/BANDAGES/DRESSINGS) ×1 IMPLANT
GLOVE SURG ENC MOIS LTX SZ7.5 (GLOVE) ×4 IMPLANT
GOWN STRL REUS W/ TWL LRG LVL3 (GOWN DISPOSABLE) ×6 IMPLANT
GOWN STRL REUS W/ TWL XL LVL3 (GOWN DISPOSABLE) ×3 IMPLANT
GOWN STRL REUS W/TWL LRG LVL3 (GOWN DISPOSABLE) ×24
GOWN STRL REUS W/TWL XL LVL3 (GOWN DISPOSABLE)
GRAFT GORETEX STRT 4-7X45 (Vascular Products) ×1 IMPLANT
INSERT FOGARTY SM (MISCELLANEOUS) IMPLANT
KIT BASIN OR (CUSTOM PROCEDURE TRAY) ×4 IMPLANT
KIT MICROPUNCTURE NIT STIFF (SHEATH) ×1 IMPLANT
KIT PALINDROME-P 55CM (CATHETERS) IMPLANT
KIT TURNOVER KIT B (KITS) ×4 IMPLANT
NDL 18GX1X1/2 (RX/OR ONLY) (NEEDLE) ×3 IMPLANT
NDL HYPO 25GX1X1/2 BEV (NEEDLE) ×3 IMPLANT
NEEDLE 18GX1X1/2 (RX/OR ONLY) (NEEDLE) ×4 IMPLANT
NEEDLE HYPO 25GX1X1/2 BEV (NEEDLE) ×4 IMPLANT
NS IRRIG 1000ML POUR BTL (IV SOLUTION) ×4 IMPLANT
PACK CV ACCESS (CUSTOM PROCEDURE TRAY) ×4 IMPLANT
PACK SURGICAL SETUP 50X90 (CUSTOM PROCEDURE TRAY) ×4 IMPLANT
PAD ARMBOARD 7.5X6 YLW CONV (MISCELLANEOUS) ×8 IMPLANT
SOAP 2 % CHG 4 OZ (WOUND CARE) ×4 IMPLANT
SPONGE T-LAP 18X18 ~~LOC~~+RFID (SPONGE) ×1 IMPLANT
SUT ETHILON 3 0 PS 1 (SUTURE) ×4 IMPLANT
SUT MNCRL AB 4-0 PS2 18 (SUTURE) ×5 IMPLANT
SUT PROLENE 6 0 BV (SUTURE) ×6 IMPLANT
SUT VIC AB 3-0 SH 27 (SUTURE) ×8
SUT VIC AB 3-0 SH 27X BRD (SUTURE) ×3 IMPLANT
SYR 10ML LL (SYRINGE) ×4 IMPLANT
SYR 20ML LL LF (SYRINGE) ×9 IMPLANT
SYR 5ML LL (SYRINGE) ×4 IMPLANT
SYR CONTROL 10ML LL (SYRINGE) ×4 IMPLANT
TOWEL GREEN STERILE (TOWEL DISPOSABLE) ×4 IMPLANT
TOWEL GREEN STERILE FF (TOWEL DISPOSABLE) ×8 IMPLANT
UNDERPAD 30X36 HEAVY ABSORB (UNDERPADS AND DIAPERS) ×4 IMPLANT
WATER STERILE IRR 1000ML POUR (IV SOLUTION) ×4 IMPLANT

## 2021-02-20 NOTE — Anesthesia Preprocedure Evaluation (Addendum)
Anesthesia Evaluation  Patient identified by MRN, date of birth, ID band Patient awake    Reviewed: Allergy & Precautions, NPO status , Patient's Chart, lab work & pertinent test results, reviewed documented beta blocker date and time   History of Anesthesia Complications Negative for: history of anesthetic complications  Airway Mallampati: II  TM Distance: >3 FB Neck ROM: Full    Dental  (+) Dental Advisory Given, Loose,    Pulmonary   CXR - Stable cardiomegaly and chronic diffuse pulmonary vascular congestion.    Pulmonary exam normal        Cardiovascular hypertension, Pt. on medications and Pt. on home beta blockers +CHF  Normal cardiovascular exam   '22 TTE - EF 30 to 35%. Global hypokinesis. Severe concentric LVH. Grade I diastolic dysfunction (impaired relaxation). Mildly increased right ventricular wall thickness. Left atrial size was moderately dilated. A small pericardial effusion is present. The pericardial effusion is  circumferential. Mild mitral valve regurgitation. Mild aortic valve sclerosis is present, with no evidence of aortic valve stenosis.     Neuro/Psych PSYCHIATRIC DISORDERS Anxiety  Hx SI negative neurological ROS     GI/Hepatic negative GI ROS, Neg liver ROS,   Endo/Other  diabetes, Poorly Controlled, Type 2, Insulin Dependent Ca 7.1   Renal/GU ESRFRenal disease     Musculoskeletal negative musculoskeletal ROS (+)   Abdominal   Peds  Hematology  (+) anemia ,   Anesthesia Other Findings   Reproductive/Obstetrics                            Anesthesia Physical Anesthesia Plan  ASA: 4  Anesthesia Plan: General   Post-op Pain Management:    Induction: Intravenous  PONV Risk Score and Plan: 2 and Treatment may vary due to age or medical condition, Ondansetron, Midazolam and Dexamethasone  Airway Management Planned: LMA  Additional Equipment:  None  Intra-op Plan:   Post-operative Plan: Extubation in OR  Informed Consent: I have reviewed the patients History and Physical, chart, labs and discussed the procedure including the risks, benefits and alternatives for the proposed anesthesia with the patient or authorized representative who has indicated his/her understanding and acceptance.     Dental advisory given  Plan Discussed with: CRNA and Anesthesiologist  Anesthesia Plan Comments:        Anesthesia Quick Evaluation

## 2021-02-20 NOTE — Anesthesia Postprocedure Evaluation (Signed)
Anesthesia Post Note  Patient: [name] Lai  Procedure(s) Performed: RIGHT ARTERIOVENOUS GRAFT CREATION (Right) INSERTION OF DIALYSIS CATHETER USING PALINDROME CATHETER (Left: Chest) ULTRASOUND GUIDANCE FOR VASCULAR ACCESS     Patient location during evaluation: PACU Anesthesia Type: General Level of consciousness: awake and alert Pain management: pain level controlled Vital Signs Assessment: post-procedure vital signs reviewed and stable Respiratory status: spontaneous breathing, nonlabored ventilation, respiratory function stable and patient connected to nasal cannula oxygen Cardiovascular status: blood pressure returned to baseline and stable Postop Assessment: no apparent nausea or vomiting Anesthetic complications: no   No notable events documented.  Last Vitals:  Vitals:   02/20/21 1627 02/20/21 1642  BP: 139/80 139/73  Pulse: 84 83  Resp: (!) 24 (!) 22  Temp:    SpO2: 96% 98%    Last Pain:  Vitals:   02/20/21 1557  TempSrc:   PainSc: [place]

## 2021-02-20 NOTE — Discharge Instructions (Signed)
   [facility]  Discharge Instructions  AV Fistula or Graft Surgery for Dialysis Access  Please refer to the following instructions for your post-procedure care. Your surgeon or physician assistant will discuss any changes with you.  Activity  You may drive the day following your surgery, if you are comfortable and no longer taking prescription pain medication. Resume full activity as the soreness in your incision resolves.  Bathing/Showering  You may shower after you go home. Keep your incision dry for 48 hours. Do not soak in a bathtub, hot tub, or swim until the incision heals completely. You may not shower if you have a hemodialysis catheter.  Incision Care  Clean your incision with mild soap and water after 48 hours. Pat the area dry with a clean towel. You do not need a bandage unless otherwise instructed. Do not apply any ointments or creams to your incision. You may have skin glue on your incision. Do not peel it off. It will come off on its own in about one week. Your arm may swell a bit after surgery. To reduce swelling use pillows to elevate your arm so it is above your heart. Your doctor will tell you if you need to lightly wrap your arm with an ACE bandage.  Diet  Resume your normal diet. There are not special food restrictions following this procedure. In order to heal from your surgery, it is CRITICAL to get adequate nutrition. Your body requires vitamins, minerals, and protein. Vegetables are the best source of vitamins and minerals. Vegetables also provide the perfect balance of protein. Processed food has little nutritional value, so try to avoid this.  Medications  Resume taking all of your medications. If your incision is causing pain, you may take over-the counter pain relievers such as acetaminophen (Tylenol). If you were prescribed a stronger pain medication, please be aware these medications can cause nausea and constipation. Prevent  nausea by taking the medication with a snack or meal. Avoid constipation by drinking plenty of fluids and eating foods with high amount of fiber, such as fruits, vegetables, and grains.  Do not take Tylenol if you are taking prescription pain medications.  Follow up Your surgeon may want to see you in the office following your access surgery. If so, this will be arranged at the time of your surgery.  Please call us immediately for any of the following conditions:  Increased pain, redness, drainage (pus) from your incision site Fever of 101 degrees or higher Severe or worsening pain at your incision site Hand pain or numbness.  Reduce your risk of vascular disease:  Stop smoking. If you would like help, call [facility] at 1-800-QUIT-NOW 801-477-8063) or [place] at [place] your cholesterol Maintain a desired weight Control your diabetes Keep your blood pressure down  Dialysis  It will take several weeks to several months for your new dialysis access to be ready for use. Your surgeon will determine when it is okay to use it. Your nephrologist will continue to direct your dialysis. You can continue to use your Permcath until your new access is ready for use.   02/20/2021 FABIAN ROS [id number] 1971-01-31  Surgeon(s): Angelia Mould, MD  Procedure(s): RIGHT ARTERIOVENOUS GRAFT CREATION INSERTION OF DIALYSIS CATHETER USING PALINDROME CATHETER ULTRASOUND GUIDANCE FOR VASCULAR ACCESS  x Do not stick graft for 4 weeks    If you have any questions, please call the office at [phone].

## 2021-02-20 NOTE — Progress Notes (Signed)
PROGRESS NOTE    [name]  [id number] DOB: [birth year] DOA: 02/17/2021 PCP: Vevelyn Francois, NP   Brief Narrative:  HPI: [name] is a 50 y.o. male with medical history significant of HTN, DM, HLD, chronic systolic CHF, medical noncompliance presenting to ED with chest pain that started a couple of weeks ago and has been intermittent in nature. He will be laying down when he has the pain. Pain located over left lateral chest wall, under left breast. Pain described as sharp in nature and rated 9/10. No radiation. No associated diaphoresis. He is unsure how long episodes of pain last for as he has it at night and falls asleep.  He doesn't have the pain with exertion, but will have shortness of breath. His shortness of breath has also been progressively getting worse over the last 1-2 moths. He has shortness of breath with resting and with exertion. He also has complaints of orthopnea and uses 2 pillows to sleep at night. He also has worsening bilateral lower edema. He also has a cough that started 3-4 months ago. He had an episode of vomiting x 1 today in ED. He denies any acute vision changes or headaches. He is urinating normally.    No fever/chills, dizziness, lightheadedness, abdominal pain, diarrhea, dysuria, rashes. Has blurry vision/decreased vision in his left eye that has been going on "for months."    Patient is poor historian    ED Course: vitals: afebrile, bp: 202/115, HR: 101, RR: 18, oxygen: 100%RA,  Pertinent labs: Hemoglobin 9.8, CO2 20, glucose 187, BUN 49, creatinine 5.22, albumin 1.9, BNP 1329, troponin 99-->105 Chest x-ray shows stable cardiomegaly and chronic diffuse pulmonary vascular congestion.  No evidence of frank pulmonary edema. In ED given 60 mg IV Lasix, 100 mg hydralazine tablet, and 20 mg labetalol injection.  TRH was called and asked to admit.  Assessment & Plan:   Active Problems:   Hyperlipidemia LDL goal <70   Stage 4 chronic kidney disease  (Anderson)   Hypertensive crisis   Acute combined systolic and diastolic congestive heart failure (HCC)   Type 2 diabetes mellitus ([place])   Hypertensive urgency  Chest pain: Currently chest pain-free.  No acute ST-T wave changes on EKG.  Troponin elevated but flat and in fact not too far away from his baseline creatinine since it has been elevated since last 2 to 3 years likely due to demand ischemia in combination of advanced CKD.  Dyspnea/chronic vascular congestion/acute on chronic combined diastolic and systolic congestive heart failure/progressive CKD stage V/non-anion gap metabolic acidosis: Echo done in July this year shows 30% ejection fraction with grade 1 diastolic dysfunction and left ventricular global hypokinesis.  No need to repeat echo.  Remains on high dose of IV Lasix with poor urine output.  Plan for vascular access today followed by dialysis by nephrology.  Fever/leukocytosis: He developed fever of 100.4 and also developed leukocytosis of 17,000 on 02/18/2021.  Only one of the 4 blood culture sets is growing staph aureus/hominis, no MRSA.  Patient has remained afebrile since then.  Leukocytosis also improving.  UA negative and chest x-ray negative for infection.  No other indication of infection anywhere else.  Likely contaminant.  Continue to monitor off antibiotics for now.  Hypertensive urgency: Presented with blood pressure of 202/115.  Currently he is only on amlodipine and blood pressure is much better.  We will continue this and hopefully his dialysis will help as well.  Poorly controlled type 2 diabetes mellitus  with hyperglycemia: Hemoglobin A1c 8.8.  He is supposed to be taking Lantus 35 units at home.  Currently on 20 units of Lantus and SSI and blood sugar controlled.  Continue this.   Hyperlipidemia: Continue statin.  Anemia of chronic disease: Hemoglobin is stable.  Chronic visual deficit: Its been going on since 6 months according to H&P.  Patient did not mention  anything to me about his vision.  Likely diabetic retinopathy in the face of being noncompliant.  Will need ophthalmology as outpatient.  Long history of noncompliance: Per nephrologist, patient has missed his several appointments with nephrology.  Counseling provided.  DVT prophylaxis: heparin injection 5,000 Units Start: 02/17/21 2200   Code Status: Full Code  Family Communication:  None present at bedside.  Plan of care discussed with patient in length and he verbalized understanding and agreed with it.  Status is: Observation  The patient will require care spanning > 2 midnights and should be moved to inpatient because: Inpatient level of care appropriate due to severity of illness  Dispo: The patient is from: Home              Anticipated d/c is to: Home              Patient currently is not medically stable to d/c.   Difficult to place patient No        Estimated body mass index is 26.62 kg/m as calculated from the following:   Height as of this encounter: '5\' 7"'$  (1.702 m).   Weight as of this encounter: 77.1 kg.     Nutritional Assessment: Body mass index is 26.62 kg/m.Marland Kitchen Seen by dietician.  I agree with the assessment and plan as outlined below: Nutrition Status:        .  Skin Assessment: I have examined the patient's skin and I agree with the wound assessment as performed by the wound care RN as outlined below:    Consultants:  Nephrology  Procedures:  None  Antimicrobials:  Anti-infectives (From admission, onward)    None          Subjective: Patient seen and examined.  He has no complaints.    Objective: Vitals:   02/19/21 2224 02/20/21 0006 02/20/21 0328 02/20/21 0734  BP: 140/78 (!) 151/79 133/63 (!) 151/82  Pulse:      Resp:   12 14  Temp:  98.5 F (36.9 C) 98.1 F (36.7 C) 98 F (36.7 C)  TempSrc:  Oral Oral Oral  SpO2:    98%  Weight:   77.1 kg   Height:        Intake/Output Summary (Last 24 hours) at 02/20/2021 1103 Last  data filed at 02/19/2021 2225 Gross per 24 hour  Intake 3 ml  Output 200 ml  Net -197 ml    Filed Weights   02/17/21 1455 02/19/21 0332 02/20/21 0328  Weight: 63.5 kg 77.8 kg 77.1 kg    Examination:  General exam: Appears calm and comfortable  Respiratory system: Clear to auscultation. Respiratory effort normal. Cardiovascular system: S1 & S2 heard, RRR. No JVD, murmurs, rubs, gallops or clicks. No pedal edema. Gastrointestinal system: Abdomen is nondistended, soft and nontender. No organomegaly or masses felt. Normal bowel sounds heard. Central nervous system: Alert and oriented. No focal neurological deficits. Extremities: Symmetric 5 x 5 power. Skin: No rashes, lesions or ulcers.  Psychiatry: Judgement and insight appear poor   Data Reviewed: I have personally reviewed following labs and imaging studies  CBC:  Recent Labs  Lab 02/17/21 1527 02/18/21 0024 02/19/21 0018  WBC 7.7 17.1* 11.0*  NEUTROABS  --   --  8.3*  HGB 9.8* 10.4* 8.2*  HCT 31.3* 32.6* 27.0*  MCV 86.9 85.1 87.9  PLT 350 347 [id number]    Basic Metabolic Panel: Recent Labs  Lab 02/17/21 1527 02/17/21 2221 02/18/21 0024 02/19/21 0018 02/20/21 0946  NA 137  --  139 133* 136  K 4.4  --  3.6 4.1 3.5  CL 108  --  109 107 105  CO2 20*  --  18* 17* 21*  GLUCOSE 187*  --  192* 102* 91  BUN 49*  --  51* 59* 60*  CREATININE 5.22*  --  5.27* 6.38* 6.92*  CALCIUM 7.0*  --  6.9* 6.2* 7.1*  MG  --  1.5*  --   --   --   PHOS  --  4.9*  --   --   --     GFR: Estimated Creatinine Clearance: 12.3 mL/min (A) (by C-G formula based on SCr of 6.92 mg/dL (H)). Liver Function Tests: Recent Labs  Lab 02/17/21 1527  AST 20  ALT 13  ALKPHOS 105  BILITOT 0.4  PROT 5.5*  ALBUMIN 1.9*    No results for input(s): LIPASE, AMYLASE in the last 168 hours. No results for input(s): AMMONIA in the last 168 hours. Coagulation Profile: No results for input(s): INR, PROTIME in the last 168 hours. Cardiac Enzymes: No  results for input(s): CKTOTAL, CKMB, CKMBINDEX, TROPONINI in the last 168 hours. BNP (last 3 results) No results for input(s): PROBNP in the last 8760 hours. HbA1C: Recent Labs    02/18/21 1043  HGBA1C 8.8*    CBG: Recent Labs  Lab 02/19/21 1137 02/19/21 1558 02/19/21 2103 02/20/21 0609 02/20/21 0731  GLUCAP 112* 98 136* 76 118*    Lipid Profile: Recent Labs    02/18/21 0024  CHOL 160  HDL 44  LDLCALC 98  TRIG 88  CHOLHDL 3.6    Thyroid Function Tests: No results for input(s): TSH, T4TOTAL, FREET4, T3FREE, THYROIDAB in the last 72 hours. Anemia Panel: Recent Labs    02/17/21 2221  FERRITIN 68  TIBC 277  IRON 23*    Sepsis Labs: No results for input(s): PROCALCITON, LATICACIDVEN in the last 168 hours.  Recent Results (from the past 240 hour(s))  Resp Panel by RT-PCR (Flu A&B, Covid) Nasopharyngeal Swab     Status: None   Collection Time: 02/17/21  4:45 PM   Specimen: Nasopharyngeal Swab; Nasopharyngeal(NP) swabs in vial transport medium  Result Value Ref Range Status   SARS Coronavirus 2 by RT PCR NEGATIVE NEGATIVE Final    Comment: (NOTE) SARS-CoV-2 target nucleic acids are NOT DETECTED.  The SARS-CoV-2 RNA is generally detectable in upper respiratory specimens during the acute phase of infection. The lowest concentration of SARS-CoV-2 viral copies this assay can detect is 138 copies/mL. A negative result does not preclude SARS-Cov-2 infection and should not be used as the sole basis for treatment or other patient management decisions. A negative result may occur with  improper specimen collection/handling, submission of specimen other than nasopharyngeal swab, presence of viral mutation(s) within the areas targeted by this assay, and inadequate number of viral copies(<138 copies/mL). A negative result must be combined with clinical observations, patient history, and epidemiological information. The expected result is Negative.  Fact Sheet for  Patients:  EntrepreneurPulse.com.au  Fact Sheet for Healthcare Providers:  IncredibleEmployment.be  This test is no t  yet approved or cleared by the [place] and  has been authorized for detection and/or diagnosis of SARS-CoV-2 by FDA under an Emergency Use Authorization (EUA). This EUA will remain  in effect (meaning this test can be used) for the duration of the COVID-19 declaration under Section 564(b)(1) of the Act, 21 U.S.C.section 360bbb-3(b)(1), unless the authorization is terminated  or revoked sooner.       Influenza A by PCR NEGATIVE NEGATIVE Final   Influenza B by PCR NEGATIVE NEGATIVE Final    Comment: (NOTE) The Xpert Xpress SARS-CoV-2/FLU/RSV plus assay is intended as an aid in the diagnosis of influenza from Nasopharyngeal swab specimens and should not be used as a sole basis for treatment. Nasal washings and aspirates are unacceptable for Xpert Xpress SARS-CoV-2/FLU/RSV testing.  Fact Sheet for Patients: EntrepreneurPulse.com.au  Fact Sheet for Healthcare Providers: IncredibleEmployment.be  This test is not yet approved or cleared by the [place] FDA and has been authorized for detection and/or diagnosis of SARS-CoV-2 by FDA under an Emergency Use Authorization (EUA). This EUA will remain in effect (meaning this test can be used) for the duration of the COVID-19 declaration under Section 564(b)(1) of the Act, 21 U.S.C. section 360bbb-3(b)(1), unless the authorization is terminated or revoked.  Performed at [facility], [place] [address], [place], [place] [address]   MRSA Next Gen by PCR, Nasal     Status: None   Collection Time: 02/17/21 10:09 PM   Specimen: Nasal Mucosa; Nasal Swab  Result Value Ref Range Status   MRSA by PCR Next Gen NOT DETECTED NOT DETECTED Final    Comment: (NOTE) The GeneXpert MRSA Assay (FDA approved for NASAL specimens only), is one  component of a comprehensive MRSA colonization surveillance program. It is not intended to diagnose MRSA infection nor to guide or monitor treatment for MRSA infections. Test performance is not FDA approved in patients less than 29 years old. Performed at [facility], [place] [address] [place], [place] [address]   Culture, blood (routine x 2)     Status: None (Preliminary result)   Collection Time: 02/18/21 12:24 AM   Specimen: BLOOD  Result Value Ref Range Status   Specimen Description BLOOD RIGHT ANTECUBITAL  Final   Special Requests   Final    BOTTLES DRAWN AEROBIC AND ANAEROBIC Blood Culture adequate volume   Culture   Final    NO GROWTH 2 DAYS Performed at [facility], [place] [address] [place], [place] [address]    Report Status PENDING  Incomplete  Culture, blood (routine x 2)     Status: Abnormal   Collection Time: 02/18/21 12:24 AM   Specimen: BLOOD RIGHT HAND  Result Value Ref Range Status   Specimen Description BLOOD RIGHT HAND  Final   Special Requests   Final    BOTTLES DRAWN AEROBIC AND ANAEROBIC Blood Culture adequate volume   Culture  Setup Time   Final    GRAM POSITIVE COCCI AEROBIC BOTTLE ONLY CRITICAL RESULT CALLED TO, READ BACK BY AND VERIFIED WITH: L,CHEN PHARMD '@1840'$  02/18/21 EB    Culture (A)  Final    STAPHYLOCOCCUS HOMINIS THE SIGNIFICANCE OF ISOLATING THIS ORGANISM FROM A SINGLE SET OF BLOOD CULTURES WHEN MULTIPLE SETS ARE DRAWN IS UNCERTAIN. PLEASE NOTIFY THE MICROBIOLOGY DEPARTMENT WITHIN ONE WEEK IF SPECIATION AND SENSITIVITIES ARE REQUIRED. Performed at [facility], [place] [address] [place], [place] [address]    Report Status 02/19/2021 FINAL  Final  Blood Culture ID Panel (Reflexed)     Status: Abnormal   Collection Time: 02/18/21 12:24 AM  Result Value Ref Range Status   Enterococcus faecalis NOT DETECTED NOT DETECTED Final   Enterococcus Faecium NOT DETECTED NOT DETECTED Final   Listeria monocytogenes NOT DETECTED NOT  DETECTED Final   Staphylococcus species DETECTED (A) NOT DETECTED Final    Comment: CRITICAL RESULT CALLED TO, READ BACK BY AND VERIFIED WITH: L,CHEN PHARMD '@1840'$  02/18/21 EB    Staphylococcus aureus (BCID) NOT DETECTED NOT DETECTED Final   Staphylococcus epidermidis NOT DETECTED NOT DETECTED Final   Staphylococcus lugdunensis NOT DETECTED NOT DETECTED Final   Streptococcus species NOT DETECTED NOT DETECTED Final   Streptococcus agalactiae NOT DETECTED NOT DETECTED Final   Streptococcus pneumoniae NOT DETECTED NOT DETECTED Final   Streptococcus pyogenes NOT DETECTED NOT DETECTED Final   A.calcoaceticus-baumannii NOT DETECTED NOT DETECTED Final   Bacteroides fragilis NOT DETECTED NOT DETECTED Final   Enterobacterales NOT DETECTED NOT DETECTED Final   Enterobacter cloacae complex NOT DETECTED NOT DETECTED Final   Escherichia coli NOT DETECTED NOT DETECTED Final   Klebsiella aerogenes NOT DETECTED NOT DETECTED Final   Klebsiella oxytoca NOT DETECTED NOT DETECTED Final   Klebsiella pneumoniae NOT DETECTED NOT DETECTED Final   Proteus species NOT DETECTED NOT DETECTED Final   Salmonella species NOT DETECTED NOT DETECTED Final   Serratia marcescens NOT DETECTED NOT DETECTED Final   Haemophilus influenzae NOT DETECTED NOT DETECTED Final   Neisseria meningitidis NOT DETECTED NOT DETECTED Final   Pseudomonas aeruginosa NOT DETECTED NOT DETECTED Final   Stenotrophomonas maltophilia NOT DETECTED NOT DETECTED Final   Candida albicans NOT DETECTED NOT DETECTED Final   Candida auris NOT DETECTED NOT DETECTED Final   Candida glabrata NOT DETECTED NOT DETECTED Final   Candida krusei NOT DETECTED NOT DETECTED Final   Candida parapsilosis NOT DETECTED NOT DETECTED Final   Candida tropicalis NOT DETECTED NOT DETECTED Final   Cryptococcus neoformans/gattii NOT DETECTED NOT DETECTED Final    Comment: Performed at [facility], [address] [place], [place] [address]       Radiology  Studies: No results found.  Scheduled Meds:  amLODipine  10 mg Oral Daily   aspirin EC  81 mg Oral Daily   atorvastatin  40 mg Oral Daily   calcitRIOL  0.25 mcg Oral Daily   carvedilol  12.5 mg Oral BID WC   Chlorhexidine Gluconate Cloth  6 each Topical Q0600   darbepoetin (ARANESP) injection - DIALYSIS  60 mcg Intravenous Q Tue-HD   heparin  5,000 Units Subcutaneous Q8H   hydrALAZINE  25 mg Oral TID   insulin aspart  0-9 Units Subcutaneous TID WC   insulin detemir  20 Units Subcutaneous Daily   nitroGLYCERIN  0.4 mg Transdermal Daily   sodium chloride flush  3 mL Intravenous Q12H   torsemide  100 mg Oral Daily   Continuous Infusions:  sodium chloride 20 mL/hr at 02/17/21 1737   sodium chloride     sodium chloride     sodium chloride     ferric gluconate (FERRLECIT) IVPB 250 mg (02/19/21 1144)     LOS: 2 days   Time spent: 29 minutes   [name], MD [facility]  02/20/2021, 11:03 AM  Please page via [facility] and do not message via secure chat for anything urgent. Secure chat can be used for anything non urgent.  How to contact the [facility] Attending or Consulting provider [place]  or covering provider during after hours [place], for this patient?  Check the care team in [facility] and look for a) attending/consulting [facility] provider listed and b) the [facility] team listed. Page or secure chat [phone]. Log into [email or web address] and use [place]'s universal password to access. If you do not have the password, please contact the hospital operator. Locate the [facility] provider you are looking for under [facility] and page to a number that you can be directly reached. If you still have difficulty reaching the provider, please page the [facility] (Director on Call) for the Hospitalists listed on amion for assistance.

## 2021-02-20 NOTE — Anesthesia Procedure Notes (Addendum)
Procedure Name: LMA Insertion Date/Time: 02/20/2021 1:14 PM Performed by: Michele Rockers, CRNA Pre-anesthesia Checklist: Patient identified, Emergency Drugs available, Suction available, Timeout performed and Patient being monitored Patient Re-evaluated:Patient Re-evaluated prior to induction Oxygen Delivery Method: Circle system utilized Preoxygenation: Pre-oxygenation with 100% oxygen Induction Type: IV induction Ventilation: Mask ventilation without difficulty LMA Size: 5.0 Number of attempts: 1 Tube secured with: Tape Dental Injury: Teeth and Oropharynx as per pre-operative assessment

## 2021-02-20 NOTE — Progress Notes (Signed)
VASCULAR SURGERY:  Called to see patient because of some paresthesias in the right hand.  On exam the right hand has reasonable temperature with a fairly brisk radial signal with the Doppler.  The current radial signal has improved compared to the signal in the operating room.  He has reasonably good strength in the right hand.  He clearly has some mild steal symptoms.  I did use a 4-7 mm tapered graft and left most of the 4 mm end of the graft.  He has a very small brachial artery.  I explained that the only option if his symptoms worsen would be to remove the graft so I think for now we should just follow this.  Gae Gallop, MD 5:18 PM

## 2021-02-20 NOTE — Transfer of Care (Signed)
Immediate Anesthesia Transfer of Care Note  Patient: [name]  Procedure(s) Performed: RIGHT ARTERIOVENOUS GRAFT CREATION (Right) INSERTION OF DIALYSIS CATHETER USING PALINDROME CATHETER (Left: Chest) ULTRASOUND GUIDANCE FOR VASCULAR ACCESS  Patient Location: PACU  Anesthesia Type:General  Level of Consciousness: awake, drowsy and patient cooperative  Airway & Oxygen Therapy: Patient Spontanous Breathing and Patient connected to face mask oxygen  Post-op Assessment: Report given to RN, Post -op Vital signs reviewed and stable and Patient moving all extremities X 4  Post vital signs: Reviewed and stable  Last Vitals:  Vitals Value Taken Time  BP 158/89 02/20/21 1557  Temp 98.4   Pulse 87 02/20/21 1559  Resp 14 02/20/21 1559  SpO2 100 % 02/20/21 1559  Vitals shown include unvalidated device data.  Last Pain:  Vitals:   02/20/21 1149  TempSrc:   PainSc: 0-No pain         Complications: No notable events documented.

## 2021-02-20 NOTE — Progress Notes (Signed)
Upper extremity vein mapping completed. Refer to "CV Proc" under chart review to view preliminary results.  02/20/2021 12:37 PM Kelby Aline., MHA, RVT, RDCS, RDMS

## 2021-02-20 NOTE — Op Note (Signed)
NAME: [name]    MRN: [id number] DOB: [birth year]    DATE OF OPERATION: 02/20/2021  PREOP DIAGNOSIS:    Chronic kidney disease  POSTOP DIAGNOSIS:    Same  PROCEDURE:    Ultrasound-guided placement of left IJ 23 cm tunneled dialysis catheter Placement of new right forearm AV graft (4-7 mm PTFE graft  SURGEON: Judeth Cornfield. Scot Dock, MD  ASSIST: Liana Crocker, PA  ANESTHESIA: General  EBL: Minimal  INDICATIONS:    [name] is a 50 y.o. male who presents for new access  FINDINGS:   His veins were not adequate for a fistula.  I placed a forearm loop graft.  The arterial aspect of the graft was tunneled along the ulnar aspect of the forearm.  TECHNIQUE:   The patient was taken to the operating room and received a general anesthetic.  Both IJ's appeared patent by ultrasound.  The neck and upper chest were prepped and draped in usual sterile fashion.  Given that we were placing a fistula or graft in the right arm I elected to place his catheter on the left.  Under ultrasound guidance, after the skin was anesthetized, I cannulated the left IJ with a micropuncture needle and a micropuncture sheath was introduced over a wire.  I then advanced the J-wire into the right atrium.  The exit site for the catheter was selected.  The catheter was brought through the tunnel.  The tract over the wire was dilated and then the dilator and peel-away sheath were advanced over the wire and the wire and dilator removed.  The cath was passed through the peel-away sheath and positioned in the at the cavoatrial junction.  Both ports withdrew easily.  Catheter was flushed with heparinized saline.  Position of the catheter was confirmed under fluoroscopy and there was no kink.  The catheter was secured at its exit site with a 3-0 nylon suture.  The IJ cannulation site was closed with a 4-0 Monocryl suture.  Sterile dressing was applied.  Attention was first turned to the right arm.  A  transverse incision was made just above the antecubital level after the arm had been prepped and draped in usual sterile fashion.  The only potential option for a fistula was the upper arm cephalic vein however upon exploration this was quite small.  I therefore dissected the brachial artery and brachial vein free beneath the fascia.  They were both reasonable in size although the artery was somewhat small about 3 mm.  A 4-7 mm PTFE graft was tunneled in a loop fashion in the right forearm with the arterial aspect of the graft along the ulnar aspect of the forearm.  The patient was heparinized.  I did confirm with the Doppler that this was in fact the brachial artery.  The brachial artery was clamped proximally distally and a longitudinal arteriotomy was made.  Only a short segment of the 4 mm of the gap graft was excised, the graft slightly spatulated and sewn end-to-side to the brachial artery using continuous 6-0 Prolene suture.  The graft was imported the appropriate length for anastomosis to the brachial vein.  The vein was ligated distally and spatulated proximally.  The graft cut the appropriate length spatulated and sewn into into the vein using 2 continuous 6-0 Prolene sutures.  At the completion there was a thrill in the fistula and a radial and ulnar signal with the Doppler.  This augmented with compression of the graft.  The heparin  was partially reversed with protamine.  The counterincision was closed with a deep layer of 3-0 Vicryl and the skin closed with 4-0 Monocryl.  The antecubital incision was closed with a deep layer of 3-0 Vicryl and the skin closed with 4-0 Monocryl.  Dermabond was applied.  The patient tolerated the procedure well was transferred to the recovery room in stable condition.  All needle and sponge counts were correct.  Given the complexity of the case a first assistant was necessary in order to expedient the procedure and safely perform the technical aspects of the  operation.  [name], MD, FACS [facility] DICTATION:   02/20/2021

## 2021-02-20 NOTE — Progress Notes (Signed)
[facility] Progress Note   Subjective:   Overall feeing ok.  UOP 221m yesterday.  [facility]    Objective Vitals:   02/19/21 1947 02/19/21 2224 02/20/21 0006 02/20/21 0328  BP: 140/78 140/78 (!) 151/79 133/63  Pulse: 82     Resp: 16   12  Temp: 98.4 F (36.9 C)  98.5 F (36.9 C) 98.1 F (36.7 C)  TempSrc: Oral  Oral Oral  SpO2: (!) 84%     Weight:    77.1 kg  Height:       Physical Exam General: nontoxic lying in bed Heart: RRR Lungs: clear, normal WOB Abdomen: soft, nontender Extremities: no edema Dialysis Access: none  Additional Objective Labs: Basic Metabolic Panel: Recent Labs  Lab 02/17/21 1527 02/17/21 2221 02/18/21 0024 02/19/21 0018  NA 137  --  139 133*  K 4.4  --  3.6 4.1  CL 108  --  109 107  CO2 20*  --  18* 17*  GLUCOSE 187*  --  192* 102*  BUN 49*  --  51* 59*  CREATININE 5.22*  --  5.27* 6.38*  CALCIUM 7.0*  --  6.9* 6.2*  PHOS  --  4.9*  --   --     Liver Function Tests: Recent Labs  Lab 02/17/21 1527  AST 20  ALT 13  ALKPHOS 105  BILITOT 0.4  PROT 5.5*  ALBUMIN 1.9*    No results for input(s): LIPASE, AMYLASE in the last 168 hours. CBC: Recent Labs  Lab 02/17/21 1527 02/18/21 0024 02/19/21 0018  WBC 7.7 17.1* 11.0*  NEUTROABS  --   --  8.3*  HGB 9.8* 10.4* 8.2*  HCT 31.3* 32.6* 27.0*  MCV 86.9 85.1 87.9  PLT 350 347 276    Blood Culture    Component Value Date/Time   SDES BLOOD RIGHT ANTECUBITAL 02/18/2021 0024   SDES BLOOD RIGHT HAND 02/18/2021 0024   SPECREQUEST  02/18/2021 0024    BOTTLES DRAWN AEROBIC AND ANAEROBIC Blood Culture adequate volume   SPECREQUEST  02/18/2021 0024    BOTTLES DRAWN AEROBIC AND ANAEROBIC Blood Culture adequate volume   CULT  02/18/2021 0024    NO GROWTH 1 DAY Performed at [facility], [place] [id number]   CULT (A) 02/18/2021 0024    STAPHYLOCOCCUS HOMINIS THE SIGNIFICANCE OF ISOLATING  THIS ORGANISM FROM A SINGLE SET OF BLOOD CULTURES WHEN MULTIPLE SETS ARE DRAWN IS UNCERTAIN. PLEASE NOTIFY THE MICROBIOLOGY DEPARTMENT WITHIN ONE WEEK IF SPECIATION AND SENSITIVITIES ARE REQUIRED. Performed at [facility] [id number] [place] [address]   REPTSTATUS PENDING 02/18/2021 0024   REPTSTATUS 02/19/2021 FINAL 02/18/2021 0024    Cardiac Enzymes: No results for input(s): CKTOTAL, CKMB, CKMBINDEX, TROPONINI in the last 168 hours. CBG: Recent Labs  Lab 02/18/21 2106 02/19/21 0608 02/19/21 1137 02/19/21 1558 02/19/21 2103  GLUCAP 136* 104* 112* 98 136*    Iron Studies:  Recent Labs    02/17/21 2221  IRON 23*  TIBC 277  FERRITIN 68    '@lablastinr3'$ @ Studies/Results: No results found. Medications:  sodium chloride 20 mL/hr at 02/17/21 1737   sodium chloride     sodium chloride     sodium chloride     ferric gluconate (FERRLECIT) IVPB 250 mg (02/19/21 1144)    amLODipine  10 mg Oral Daily   aspirin EC  81 mg Oral Daily  atorvastatin  40 mg Oral Daily   carvedilol  12.5 mg Oral BID WC   Chlorhexidine Gluconate Cloth  6 each Topical Q0600   darbepoetin (ARANESP) injection - DIALYSIS  60 mcg Intravenous Q Tue-HD   heparin  5,000 Units Subcutaneous Q8H   hydrALAZINE  25 mg Oral TID   insulin aspart  0-9 Units Subcutaneous TID WC   insulin detemir  20 Units Subcutaneous Daily   nitroGLYCERIN  0.4 mg Transdermal Daily   sodium chloride flush  3 mL Intravenous Q12H     Assessment/Recommendations: [name] is a/an 50 y.o. male with a past medical history HTN, DM2, HLD, HFrEF (EF 30 to 35% in July 2022), CKD who present w/ chest pain and progressive CKD   CKD V now with uremic symptoms > progressed to ESRD:   Having low grade uremic symptoms now and on history of poor adherence/follow up he's agreeable to starting dialysis while in hospital.   -Vein mapped and consulted [facility]VS for perm access and catheter - will start HD thereafter today -CLIP  to outpt unit, navigator aware -Continue to monitor daily Cr, Dose meds for ESRD -Monitor Daily I/Os, Daily weight  -Maintain MAP>65 for optimal renal perfusion.  -Avoid nephrotoxic medications including NSAIDs and Vanc/Zosyn combo  Secondary Hyperparathyroidism:  check PTH, phos ok, start calcitriol for hypoca, use 2.5ca dialysate for now   HFrEF: History of heart failure with EF of 30 to 35% in July 2022.  -Hypertension management as below -torsemide 100 daily and further vol mgmt with dialysis   Hypertension: Currently on amlodipine and coreg with reasonable BPs.     Fever: had a fever to 100.4 sat AM but none since; WBC was 17 now 11. No antibiotics.  1 of 2 blood cultures with staph hominis ? Contaminant as he's defervesced and leukocytosis resolved without intervention.    UA not c/w infection.   Anemia due to CKD: Hemoglobin 10.4 > 8.2.  No gross bleeding noted.  Iron sat 8 and ferritin 68. IV iron '250mg'$  daily x4 doses. ESA with HD. Continue to monitor.    Uncontrolled Diabetes Mellitus Type 2 with Hyperglycemia: Management per primary   Metabolic acidosis: Bicarb 18.  Mild and likely related to CKD.  Will correct with HD.   [name] MD 02/20/2021, 5:58 AM  [facility] Pager: [phone]

## 2021-02-20 NOTE — Plan of Care (Signed)
  Problem: Cardiac: Goal: Ability to achieve and maintain adequate cardiopulmonary perfusion will improve Outcome: Progressing   Problem: Education: Goal: Knowledge of General Education information will improve Description: Including pain rating scale, medication(s)/side effects and non-pharmacologic comfort measures Outcome: Progressing   Problem: Health Behavior/Discharge Planning: Goal: Ability to manage health-related needs will improve Outcome: Progressing   Problem: Clinical Measurements: Goal: Ability to maintain clinical measurements within normal limits will improve Outcome: Progressing

## 2021-02-20 NOTE — Interval H&P Note (Signed)
History and Physical Interval Note:  02/20/2021 12:50 PM  [name]  has presented today for surgery, with the diagnosis of ESRD.  The various methods of treatment have been discussed with the patient and family. After consideration of risks, benefits and other options for treatment, the patient has consented to  Procedure(s): RIGHT ARTERIOVENOUS (AV) FISTULA VERSUS GRAFT CREATION (Right) INSERTION OF DIALYSIS CATHETER (N/A) as a surgical intervention.  The patient's history has been reviewed, patient examined, no change in status, stable for surgery.  I have reviewed the patient's chart and labs.  Questions were answered to the patient's satisfaction.     [name]

## 2021-02-21 ENCOUNTER — Encounter (HOSPITAL_COMMUNITY): Payer: Self-pay | Admitting: Vascular Surgery

## 2021-02-21 DIAGNOSIS — T82898A Other specified complication of vascular prosthetic devices, implants and grafts, initial encounter: Secondary | ICD-10-CM

## 2021-02-21 LAB — CBC WITH DIFFERENTIAL/PLATELET
Abs Immature Granulocytes: 0.02 10*3/uL (ref 0.00–0.07)
Basophils Absolute: 0 10*3/uL (ref 0.0–0.1)
Basophils Relative: 0 %
Eosinophils Absolute: 0.2 10*3/uL (ref 0.0–0.5)
Eosinophils Relative: 2 %
HCT: 26.1 % — ABNORMAL LOW (ref 39.0–52.0)
Hemoglobin: 8.2 g/dL — ABNORMAL LOW (ref 13.0–17.0)
Immature Granulocytes: 0 %
Lymphocytes Relative: 11 %
Lymphs Abs: 0.9 10*3/uL (ref 0.7–4.0)
MCH: 26.6 pg (ref 26.0–34.0)
MCHC: 31.4 g/dL (ref 30.0–36.0)
MCV: 84.7 fL (ref 80.0–100.0)
Monocytes Absolute: 1 10*3/uL (ref 0.1–1.0)
Monocytes Relative: 12 %
Neutro Abs: 6.4 10*3/uL (ref 1.7–7.7)
Neutrophils Relative %: 75 %
Platelets: 328 10*3/uL (ref 150–400)
RBC: 3.08 MIL/uL — ABNORMAL LOW (ref 4.22–5.81)
RDW: 13.7 % (ref 11.5–15.5)
WBC: 8.5 10*3/uL (ref 4.0–10.5)
nRBC: 0 % (ref 0.0–0.2)

## 2021-02-21 LAB — HEPATITIS B SURFACE ANTIBODY,QUALITATIVE: Hep B S Ab: NONREACTIVE

## 2021-02-21 LAB — HEPATITIS B CORE ANTIBODY, TOTAL: Hep B Core Total Ab: NONREACTIVE

## 2021-02-21 LAB — GLUCOSE, CAPILLARY
Glucose-Capillary: 133 mg/dL — ABNORMAL HIGH (ref 70–99)
Glucose-Capillary: 116 mg/dL — ABNORMAL HIGH (ref 70–99)
Glucose-Capillary: 140 mg/dL — ABNORMAL HIGH (ref 70–99)
Glucose-Capillary: 139 mg/dL — ABNORMAL HIGH (ref 70–99)

## 2021-02-21 LAB — HEPATITIS B SURFACE ANTIGEN: Hepatitis B Surface Ag: NONREACTIVE

## 2021-02-21 NOTE — Progress Notes (Signed)
PROGRESS NOTE    [name]  [id number] DOB: 10-22-1970 DOA: 02/17/2021 PCP: Vevelyn Francois, NP   Brief Narrative:  [name] is a 50 y.o. male with medical history significant of HTN, DM, HLD, chronic systolic CHF, medical noncompliance presented to ED with chest pain that started a couple of weeks ago and has been intermittent in nature. Pain located over left lateral chest wall, under left breast, sharp 9 out of 10 with no radiation and no associated diaphoresis or exacerbating or relieving factors.  Had associated shortness of breath intermittently.  Also endorses orthopnea and uses 2 pillows to sleep.  Has lower extremity edema bilateral which started 3 to 4 months ago.  No fever, chills or any other complaint.  Patient is a poor historian.  Upon arrival to ED, he was afebrile with blood pressure of 202/115.  Otherwise saturating 100% on room air.  Troponin slightly elevated but flat. Chest x-ray shows stable cardiomegaly and chronic diffuse pulmonary vascular congestion.  No evidence of frank pulmonary edema. In ED given 60 mg IV Lasix, 100 mg hydralazine tablet, and 20 mg labetalol injection.  TRH was called and asked to admit.  Due to advanced and progressive CKD, nephrology was consulted and they recommended initiating dialysis while inpatient.  Vascular surgery did TDC on 02/20/2021.  Patient started on dialysis on 02/21/2021.  Assessment & Plan:   Active Problems:   Hyperlipidemia LDL goal <70   Stage 4 chronic kidney disease ([place])   Hypertensive crisis   Acute combined systolic and diastolic congestive heart failure (HCC)   Type 2 diabetes mellitus ([place])   Hypertensive urgency  Chest pain: Currently chest pain-free.  No acute ST-T wave changes on EKG.  Troponin elevated but flat and in fact not too far away from his baseline creatinine since it has been elevated since last 2 to 3 years likely due to demand ischemia in combination of advanced CKD.  Dyspnea/chronic  vascular congestion/acute on chronic combined diastolic and systolic congestive heart failure/progressive CKD stage V/non-anion gap metabolic acidosis: Echo done in July this year shows 30% ejection fraction with grade 1 diastolic dysfunction and left ventricular global hypokinesis.  No need to repeat echo.  S/p TDC and AVG on 02/20/2021 by Dr. Doren Custard.#1 HD session today.  Remains on 100 mg of p.o. torsemide and still anuric.    Fever/leukocytosis: He developed fever of 100.4 and also developed leukocytosis of 17,000 on 02/18/2021.  Only one of the 4 blood culture sets is growing staph aureus/hominis, no MRSA.  Patient has remained afebrile since then.  Leukocytosis also improving.  UA negative and chest x-ray negative for infection.  No other indication of infection anywhere else.  Likely contaminant.  Continue to monitor off antibiotics for now.  Hypertensive urgency: Presented with blood pressure of 202/115.  Currently he is only on amlodipine and blood pressure is much better.  Although slightly elevated now but hopefully after dialysis it will be better.  Poorly controlled type 2 diabetes mellitus with hyperglycemia: Hemoglobin A1c 8.8.  He is supposed to be taking Lantus 35 units at home.  Currently on 20 units of Lantus and SSI and blood sugar controlled.  Continue this.   Hyperlipidemia: Continue statin.  Anemia of chronic disease: Hemoglobin is stable.  Chronic visual deficit: Its been going on since 6 months according to H&P.  Patient did not mention anything to me about his vision.  Likely diabetic retinopathy in the face of being noncompliant.  Will need  ophthalmology as outpatient.  Long history of noncompliance: Per nephrologist, patient has missed his several appointments with nephrology.  Counseling provided.  DVT prophylaxis: heparin injection 5,000 Units Start: 02/17/21 2200   Code Status: Full Code  Family Communication:  None present at bedside.  Plan of care discussed with  patient in length and he verbalized understanding and agreed with it.  Status is: Observation  The patient will require care spanning > 2 midnights and should be moved to inpatient because: Inpatient level of care appropriate due to severity of illness  Dispo: The patient is from: Home              Anticipated d/c is to: Home              Patient currently is not medically stable to d/c.   Difficult to place patient No        Estimated body mass index is 27.04 kg/m as calculated from the following:   Height as of this encounter: '5\' 7"'$  (1.702 m).   Weight as of this encounter: 78.3 kg.     Nutritional Assessment: Body mass index is 27.04 kg/m.Mar[place] Kitchen Seen by dietician.  I agree with the assessment and plan as outlined below: Nutrition Status:        .  Skin Assessment: I have examined the patient's skin and I agree with the wound assessment as performed by the wound care RN as outlined below:    Consultants:  Nephrology  Procedures:  None  Antimicrobials:  Anti-infectives (From admission, onward)    Start     Dose/Rate Route Frequency Ordered Stop   02/20/21 1345  vancomycin (VANCOCIN) IVPB 1000 mg/200 mL premix  Status:  Discontinued        1,000 mg 200 mL/hr over 60 Minutes Intravenous To Surgery 02/20/21 1335 02/20/21 1742          Subjective: Seen and examined.  He has no complaints.  Objective: Vitals:   02/21/21 0903 02/21/21 0914 02/21/21 0930 02/21/21 1000  BP: 140/80 139/75 (!) 145/75 (!) 145/80  Pulse: 89 85 83 86  Resp: (!) 21     Temp: 98.2 F (36.8 C)     TempSrc: Oral     SpO2:      Weight: 78.3 kg     Height:        Intake/Output Summary (Last 24 hours) at 02/21/2021 1038 Last data filed at 02/20/2021 2109 Gross per 24 hour  Intake 773 ml  Output 30 ml  Net 743 ml    Filed Weights   02/20/21 0328 02/21/21 0332 02/21/21 0903  Weight: 77.1 kg 80.3 kg 78.3 kg    Examination:  General exam: Appears calm and comfortable   Respiratory system: Clear to auscultation. Respiratory effort normal. Cardiovascular system: S1 & S2 heard, RRR. No JVD, murmurs, rubs, gallops or clicks. No pedal edema. Gastrointestinal system: Abdomen is nondistended, soft and nontender. No organomegaly or masses felt. Normal bowel sounds heard. Central nervous system: Alert and oriented. No focal neurological deficits. Extremities: Symmetric 5 x 5 power. Skin: No rashes, lesions or ulcers.  Psychiatry: Judgement and insight appear poor   Data Reviewed: I have personally reviewed following labs and imaging studies  CBC: Recent Labs  Lab 02/17/21 1527 02/18/21 0024 02/19/21 0018 02/21/21 0114  WBC 7.7 17.1* 11.0* 8.5  NEUTROABS  --   --  8.3* 6.4  HGB 9.8* 10.4* 8.2* 8.2*  HCT 31.3* 32.6* 27.0* 26.1*  MCV 86.9 85.1 87.9 84.7  PLT 350 347 276 [id number]    Basic Metabolic Panel: Recent Labs  Lab 02/17/21 1527 02/17/21 2221 02/18/21 0024 02/19/21 0018 02/20/21 0946  NA 137  --  139 133* 136  K 4.4  --  3.6 4.1 3.5  CL 108  --  109 107 105  CO2 20*  --  18* 17* 21*  GLUCOSE 187*  --  192* 102* 91  BUN 49*  --  51* 59* 60*  CREATININE 5.22*  --  5.27* 6.38* 6.92*  CALCIUM 7.0*  --  6.9* 6.2* 7.1*  MG  --  1.5*  --   --   --   PHOS  --  4.9*  --   --   --     GFR: Estimated Creatinine Clearance: 12.3 mL/min (A) (by C-G formula based on SCr of 6.92 mg/dL (H)). Liver Function Tests: Recent Labs  Lab 02/17/21 1527  AST 20  ALT 13  ALKPHOS 105  BILITOT 0.4  PROT 5.5*  ALBUMIN 1.9*    No results for input(s): LIPASE, AMYLASE in the last 168 hours. No results for input(s): AMMONIA in the last 168 hours. Coagulation Profile: No results for input(s): INR, PROTIME in the last 168 hours. Cardiac Enzymes: No results for input(s): CKTOTAL, CKMB, CKMBINDEX, TROPONINI in the last 168 hours. BNP (last 3 results) No results for input(s): PROBNP in the last 8760 hours. HbA1C: Recent Labs    02/18/21 1043  HGBA1C 8.8*     CBG: Recent Labs  Lab 02/20/21 1137 02/20/21 1559 02/20/21 1828 02/20/21 2109 02/21/21 0617  GLUCAP 82 114* 94 154* 133*    Lipid Profile: No results for input(s): CHOL, HDL, LDLCALC, TRIG, CHOLHDL, LDLDIRECT in the last 72 hours.  Thyroid Function Tests: No results for input(s): TSH, T4TOTAL, FREET4, T3FREE, THYROIDAB in the last 72 hours. Anemia Panel: No results for input(s): VITAMINB12, FOLATE, FERRITIN, TIBC, IRON, RETICCTPCT in the last 72 hours.  Sepsis Labs: No results for input(s): PROCALCITON, LATICACIDVEN in the last 168 hours.  Recent Results (from the past 240 hour(s))  Resp Panel by RT-PCR (Flu A&B, Covid) Nasopharyngeal Swab     Status: None   Collection Time: 02/17/21  4:45 PM   Specimen: Nasopharyngeal Swab; Nasopharyngeal(NP) swabs in vial transport medium  Result Value Ref Range Status   SARS Coronavirus 2 by RT PCR NEGATIVE NEGATIVE Final    Comment: (NOTE) SARS-CoV-2 target nucleic acids are NOT DETECTED.  The SARS-CoV-2 RNA is generally detectable in upper respiratory specimens during the acute phase of infection. The lowest concentration of SARS-CoV-2 viral copies this assay can detect is 138 copies/mL. A negative result does not preclude SARS-Cov-2 infection and should not be used as the sole basis for treatment or other patient management decisions. A negative result may occur with  improper specimen collection/handling, submission of specimen other than nasopharyngeal swab, presence of viral mutation(s) within the areas targeted by this assay, and inadequate number of viral copies(<138 copies/mL). A negative result must be combined with clinical observations, patient history, and epidemiological information. The expected result is Negative.  Fact Sheet for Patients:  EntrepreneurPulse.com.au  Fact Sheet for Healthcare Providers:  IncredibleEmployment.be  This test is no t yet approved or cleared by  the [place] FDA and  has been authorized for detection and/or diagnosis of SARS-CoV-2 by FDA under an Emergency Use Authorization (EUA). This EUA will remain  in effect (meaning this test can be used) for the duration of the COVID-19 declaration under Section 564(b)(1)  of the Act, 21 U.S.C.section 360bbb-3(b)(1), unless the authorization is terminated  or revoked sooner.       Influenza A by PCR NEGATIVE NEGATIVE Final   Influenza B by PCR NEGATIVE NEGATIVE Final    Comment: (NOTE) The Xpert Xpress SARS-CoV-2/FLU/RSV plus assay is intended as an aid in the diagnosis of influenza from Nasopharyngeal swab specimens and should not be used as a sole basis for treatment. Nasal washings and aspirates are unacceptable for Xpert Xpress SARS-CoV-2/FLU/RSV testing.  Fact Sheet for Patients: EntrepreneurPulse.com.au  Fact Sheet for Healthcare Providers: IncredibleEmployment.be  This test is not yet approved or cleared by the [place] FDA and has been authorized for detection and/or diagnosis of SARS-CoV-2 by FDA under an Emergency Use Authorization (EUA). This EUA will remain in effect (meaning this test can be used) for the duration of the COVID-19 declaration under Section 564(b)(1) of the Act, 21 U.S.C. section 360bbb-3(b)(1), unless the authorization is terminated or revoked.  Performed at [facility], [place] [address], [place], [place] [address]   MRSA Next Gen by PCR, Nasal     Status: None   Collection Time: 02/17/21 10:09 PM   Specimen: Nasal Mucosa; Nasal Swab  Result Value Ref Range Status   MRSA by PCR Next Gen NOT DETECTED NOT DETECTED Final    Comment: (NOTE) The GeneXpert MRSA Assay (FDA approved for NASAL specimens only), is one component of a comprehensive MRSA colonization surveillance program. It is not intended to diagnose MRSA infection nor to guide or monitor treatment for MRSA infections. Test performance is  not FDA approved in patients less than 6 years old. Performed at [facility], [place] [address] [place], [place] [address]   Culture, blood (routine x 2)     Status: None (Preliminary result)   Collection Time: 02/18/21 12:24 AM   Specimen: BLOOD  Result Value Ref Range Status   Specimen Description BLOOD RIGHT ANTECUBITAL  Final   Special Requests   Final    BOTTLES DRAWN AEROBIC AND ANAEROBIC Blood Culture adequate volume   Culture   Final    NO GROWTH 3 DAYS Performed at [facility], [place] [address] [place], [place] [address]    Report Status PENDING  Incomplete  Culture, blood (routine x 2)     Status: Abnormal   Collection Time: 02/18/21 12:24 AM   Specimen: BLOOD RIGHT HAND  Result Value Ref Range Status   Specimen Description BLOOD RIGHT HAND  Final   Special Requests   Final    BOTTLES DRAWN AEROBIC AND ANAEROBIC Blood Culture adequate volume   Culture  Setup Time   Final    GRAM POSITIVE COCCI AEROBIC BOTTLE ONLY CRITICAL RESULT CALLED TO, READ BACK BY AND VERIFIED WITH: L,CHEN PHARMD '@1840'$  02/18/21 EB    Culture (A)  Final    STAPHYLOCOCCUS HOMINIS THE SIGNIFICANCE OF ISOLATING THIS ORGANISM FROM A SINGLE SET OF BLOOD CULTURES WHEN MULTIPLE SETS ARE DRAWN IS UNCERTAIN. PLEASE NOTIFY THE MICROBIOLOGY DEPARTMENT WITHIN ONE WEEK IF SPECIATION AND SENSITIVITIES ARE REQUIRED. Performed at [facility], [place] [address], [place], [place] [address]    Report Status 02/19/2021 FINAL  Final  Blood Culture ID Panel (Reflexed)     Status: Abnormal   Collection Time: 02/18/21 12:24 AM  Result Value Ref Range Status   Enterococcus faecalis NOT DETECTED NOT DETECTED Final   Enterococcus Faecium NOT DETECTED NOT DETECTED Final   Listeria monocytogenes NOT DETECTED NOT DETECTED Final  Staphylococcus species DETECTED (A) NOT DETECTED Final    Comment: CRITICAL RESULT CALLED TO, READ BACK BY AND VERIFIED WITH: L,CHEN PHARMD '@1840'$  02/18/21 EB    Staphylococcus  aureus (BCID) NOT DETECTED NOT DETECTED Final   Staphylococcus epidermidis NOT DETECTED NOT DETECTED Final   Staphylococcus lugdunensis NOT DETECTED NOT DETECTED Final   Streptococcus species NOT DETECTED NOT DETECTED Final   Streptococcus agalactiae NOT DETECTED NOT DETECTED Final   Streptococcus pneumoniae NOT DETECTED NOT DETECTED Final   Streptococcus pyogenes NOT DETECTED NOT DETECTED Final   A.calcoaceticus-baumannii NOT DETECTED NOT DETECTED Final   Bacteroides fragilis NOT DETECTED NOT DETECTED Final   Enterobacterales NOT DETECTED NOT DETECTED Final   Enterobacter cloacae complex NOT DETECTED NOT DETECTED Final   Escherichia coli NOT DETECTED NOT DETECTED Final   Klebsiella aerogenes NOT DETECTED NOT DETECTED Final   Klebsiella oxytoca NOT DETECTED NOT DETECTED Final   Klebsiella pneumoniae NOT DETECTED NOT DETECTED Final   Proteus species NOT DETECTED NOT DETECTED Final   Salmonella species NOT DETECTED NOT DETECTED Final   Serratia marcescens NOT DETECTED NOT DETECTED Final   Haemophilus influenzae NOT DETECTED NOT DETECTED Final   Neisseria meningitidis NOT DETECTED NOT DETECTED Final   Pseudomonas aeruginosa NOT DETECTED NOT DETECTED Final   Stenotrophomonas maltophilia NOT DETECTED NOT DETECTED Final   Candida albicans NOT DETECTED NOT DETECTED Final   Candida auris NOT DETECTED NOT DETECTED Final   Candida glabrata NOT DETECTED NOT DETECTED Final   Candida krusei NOT DETECTED NOT DETECTED Final   Candida parapsilosis NOT DETECTED NOT DETECTED Final   Candida tropicalis NOT DETECTED NOT DETECTED Final   Cryptococcus neoformans/gattii NOT DETECTED NOT DETECTED Final    Comment: Performed at [facility], [address] [place], [place] [address]       Radiology Studies: DG Chest Port 1 View  Result Date: 02/20/2021 CLINICAL DATA:  Status post dialysis catheter placement EXAM: PORTABLE CHEST 1 VIEW COMPARISON:  02/17/2021 FINDINGS: Cardiac shadow is enlarged in  size but stable. New left jugular dialysis catheter is noted in satisfactory position. No pneumothorax is noted. Lungs are well aerated bilaterally. Some improvement in the degree of vascular congestion is noted when compared with the prior exam. IMPRESSION: New dialysis catheter placement without evidence of pneumothorax. Improvement in vascular congestion when compare with the prior exam. Electronically Signed   By: Inez Catalina M.D.   On: 02/20/2021 19:33   DG Fluoro Guide CV Line-No Report  Result Date: 02/20/2021 Fluoroscopy was utilized by the requesting physician.  No radiographic interpretation.   VAS [place] UPPER EXT VEIN MAPPING (PRE-OP AVF)  Result Date: 02/20/2021 UPPER EXTREMITY VEIN MAPPING Patient Name:  [name]  Date of Exam:   02/20/2021 Medical Rec #: [id number]        Accession #:    [id number] Date of Birth: 06-03-1970       Patient Gender: M Patient Age:   89 years Exam Location:  [facility] Procedure:      VAS [place] UPPER EXT VEIN MAPPING (PRE-OP AVF) Referring Phys: [name] --------------------------------------------------------------------------------  Indications: Pre-access. Comparison Study: No prior study Performing Technologist: Maudry Mayhew MHA, RDMS, RVT, RDCS  Examination Guidelines: A complete evaluation includes B-mode imaging, spectral Doppler, color Doppler, and power Doppler as needed of all accessible portions of each vessel. Bilateral testing is considered an integral part of a complete examination. Limited examinations for reoccurring indications may be performed as noted. +-----------------+-------------+----------+---------+ Right Cephalic   Diameter (  cm)Depth (cm)Findings  +-----------------+-------------+----------+---------+ Shoulder             0.34                         +-----------------+-------------+----------+---------+ Prox upper arm       0.29                          +-----------------+-------------+----------+---------+ Mid upper arm        0.22                         +-----------------+-------------+----------+---------+ Dist upper arm       0.20                         +-----------------+-------------+----------+---------+ Antecubital fossa    0.23               branching +-----------------+-------------+----------+---------+ Prox forearm         0.23                         +-----------------+-------------+----------+---------+ Mid forearm          0.18                         +-----------------+-------------+----------+---------+ Dist forearm         0.19                         +-----------------+-------------+----------+---------+ Wrist                0.18                         +-----------------+-------------+----------+---------+ +-----------------+-------------+----------+--------------+ Right Basilic    Diameter (cm)Depth (cm)   Findings    +-----------------+-------------+----------+--------------+ Prox upper arm       0.28                              +-----------------+-------------+----------+--------------+ Mid upper arm        0.22                              +-----------------+-------------+----------+--------------+ Dist upper arm       0.25                              +-----------------+-------------+----------+--------------+ Antecubital fossa    0.18                 branching    +-----------------+-------------+----------+--------------+ Prox forearm         0.10                              +-----------------+-------------+----------+--------------+ Mid forearm          0.10                              +-----------------+-------------+----------+--------------+ Distal forearm       0.12                 branching    +-----------------+-------------+----------+--------------+  Wrist                                   not visualized  +-----------------+-------------+----------+--------------+ +-----------------+-------------+----------+---------------------+ Left Cephalic    Diameter (cm)Depth (cm)      Findings        +-----------------+-------------+----------+---------------------+ Shoulder             0.33                                     +-----------------+-------------+----------+---------------------+ Prox upper arm       0.32                                     +-----------------+-------------+----------+---------------------+ Mid upper arm        0.34                                     +-----------------+-------------+----------+---------------------+ Dist upper arm       0.26                                     +-----------------+-------------+----------+---------------------+ Antecubital fossa    0.21                                     +-----------------+-------------+----------+---------------------+ Prox forearm         0.42                                     +-----------------+-------------+----------+---------------------+ Mid forearm          0.37                    thrombosed       +-----------------+-------------+----------+---------------------+ Dist forearm                            IV and not visualized +-----------------+-------------+----------+---------------------+ Left basilic vein not evaluated *See table(s) above for measurements and observations.  Diagnosing physician: [name] MD Electronically signed by [name] MD on 02/20/2021 at 5:34:50 PM.    Final     Scheduled Meds:  amLODipine  10 mg Oral Daily   aspirin EC  81 mg Oral Daily   atorvastatin  40 mg Oral Daily   calcitRIOL  0.25 mcg Oral Daily   carvedilol  12.5 mg Oral BID WC   Chlorhexidine Gluconate Cloth  6 each Topical Q0600   darbepoetin (ARANESP) injection - DIALYSIS  60 mcg Intravenous Q Tue-HD   heparin  5,000 Units Subcutaneous Q8H   hydrALAZINE  25 mg Oral TID    insulin aspart  0-9 Units Subcutaneous TID WC   insulin detemir  20 Units Subcutaneous Daily   nitroGLYCERIN  0.4 mg Transdermal Daily   sodium chloride flush  3 mL Intravenous Q12H   torsemide  100 mg Oral Daily   Continuous Infusions:  sodium chloride 10 mL/hr at 02/20/21 1159   sodium chloride  sodium chloride     sodium chloride     ferric gluconate (FERRLECIT) IVPB 250 mg (02/19/21 1144)     LOS: 3 days   Time spent: 28 minutes   [name], MD [facility]  02/21/2021, 10:38 AM  Please page via [facility] [place] and do not message via secure chat for anything urgent. Secure chat can be used for anything non urgent.  How to contact the [facility] Attending or Consulting provider [place] or covering provider during after hours [place], for this patient?  Check the care team in [facility] and look for a) attending/consulting [facility] provider listed and b) the [facility] team listed. Page or secure chat [phone]. Log into [email or web address] and use [place]'s universal password to access. If you do not have the password, please contact the hospital operator. Locate the [facility] provider you are looking for under [facility] and page to a number that you can be directly reached. If you still have difficulty reaching the provider, please page the [facility] (Director on Call) for the Hospitalists listed on amion for assistance.

## 2021-02-21 NOTE — Progress Notes (Signed)
   VASCULAR SURGERY ASSESSMENT & PLAN:   POD 1 TDC / RIGHT FOREARM AV GRAFT: The patient continues to have some mild steal symptoms in the right hand.  However he has a biphasic radial signal with the Doppler and a strong palmar arch signal with a monophasic ulnar signal.  I used a 4 7 tapered graft so the only option if his symptoms progress would be removal of his graft.  I think we can continue to ride this out.  He also has some pain in the form related to tunneling of the graft.  If he is discharged I can follow this as an outpatient.   SUBJECTIVE:   Still has some mild paresthesias in the right hand with tenderness on the forearm where the graft was tunneled.  PHYSICAL EXAM:   Vitals:   02/20/21 1936 02/20/21 2107 02/20/21 2316 02/21/21 0332  BP: (!) 147/81 (!) 147/81 (!) 120/54 (!) 141/81  Pulse: 85  90 89  Resp: (!) '24  15 20  '$ Temp: 97.8 F (36.6 C)  98.6 F (37 C) 98.9 F (37.2 C)  TempSrc: Oral  Oral Oral  SpO2: 98%  91% 99%  Weight:    80.3 kg  Height:       Biphasic radial signal with the Doppler.  He has a strong palmar arch signal with the Doppler and a monophasic ulnar signal.  He has good strength in the right hand. His incisions look fine. His graft has a good thrill.  LABS:   Lab Results  Component Value Date   WBC 8.5 02/21/2021   HGB 8.2 (L) 02/21/2021   HCT 26.1 (L) 02/21/2021   MCV 84.7 02/21/2021   PLT 328 02/21/2021   Lab Results  Component Value Date   CREATININE 6.92 (H) 02/20/2021   Lab Results  Component Value Date   INR 1.05 09/08/2012   CBG (last 3)  Recent Labs    02/20/21 1828 02/20/21 2109 02/21/21 0617  GLUCAP 94 154* 133*    PROBLEM LIST:    Active Problems:   Hyperlipidemia LDL goal <70   Stage 4 chronic kidney disease ([place])   Hypertensive crisis   Acute combined systolic and diastolic congestive heart failure (HCC)   Type 2 diabetes mellitus (HCC)   Hypertensive urgency   CURRENT MEDS:    amLODipine  10 mg Oral  Daily   aspirin EC  81 mg Oral Daily   atorvastatin  40 mg Oral Daily   calcitRIOL  0.25 mcg Oral Daily   carvedilol  12.5 mg Oral BID WC   Chlorhexidine Gluconate Cloth  6 each Topical Q0600   darbepoetin (ARANESP) injection - DIALYSIS  60 mcg Intravenous Q Tue-HD   heparin  5,000 Units Subcutaneous Q8H   hydrALAZINE  25 mg Oral TID   insulin aspart  0-9 Units Subcutaneous TID WC   insulin detemir  20 Units Subcutaneous Daily   nitroGLYCERIN  0.4 mg Transdermal Daily   sodium chloride flush  3 mL Intravenous Q12H   torsemide  100 mg Oral Daily    [name] Office: [phone] 02/21/2021

## 2021-02-21 NOTE — TOC Progression Note (Addendum)
Transition of Care [facility]) - Progression Note    Patient Details  Name: [name] MRN: [id number] Date of Birth: January 27, 1971  [facility]) CM/SW Contact  Zenon Mayo, RN Phone Number: 02/21/2021, 5:04 PM  Clinical Narrative:    Patient started dialysis today, renal navigator working on clipping process.  He has no insurance, follow up apt made at patient care center on AVS.  TOC team will continue to follow for dc needs.    Expected Discharge Plan: Home/Self Care Barriers to Discharge: Continued Medical Work up  Expected Discharge Plan and Services Expected Discharge Plan: Home/Self Care   Discharge Planning Services: CM Consult Post Acute Care Choice: NA Living arrangements for the past 2 months: Single Family Home                   DME Agency: NA       HH Arranged: NA           Social Determinants of Health (SDOH) Interventions    Readmission Risk Interventions Readmission Risk Prevention Plan 02/19/2021  Transportation Screening Complete  Medication Review Press photographer) Complete  PCP or Specialist appointment within 3-5 days of discharge Complete  HRI or Echo Complete  SW Recovery Care/Counseling Consult Complete  [place] Not Applicable  Some recent data might be hidden

## 2021-02-21 NOTE — Plan of Care (Signed)
  Problem: Education: Goal: Ability to verbalize understanding of medication therapies will improve Outcome: Progressing   Problem: Activity: Goal: Capacity to carry out activities will improve Outcome: Progressing   Problem: Cardiac: Goal: Ability to achieve and maintain adequate cardiopulmonary perfusion will improve Outcome: Progressing   Problem: Education: Goal: Knowledge of General Education information will improve Description: Including pain rating scale, medication(s)/side effects and non-pharmacologic comfort measures Outcome: Progressing   Problem: Clinical Measurements: Goal: Ability to maintain clinical measurements within normal limits will improve Outcome: Progressing Goal: Will remain free from infection Outcome: Progressing Goal: Respiratory complications will improve Outcome: Progressing Goal: Cardiovascular complication will be avoided Outcome: Progressing

## 2021-02-21 NOTE — Progress Notes (Signed)
[facility] Progress Note   Subjective:   Overall feeing ok.  No UOP yesterday.  S/p AVGand TDC yest with Vascular surgery.   ON 1st HD this AM.   Objective Vitals:   02/21/21 0903 02/21/21 0914 02/21/21 0930 02/21/21 1000  BP: 140/80 139/75 (!) 145/75 (!) 145/80  Pulse: 89 85 83 86  Resp: (!) 21     Temp: 98.2 F (36.8 C)     TempSrc: Oral     SpO2:      Weight: 78.3 kg     Height:       Physical Exam General: nontoxic lying in bed Heart: RRR Lungs: clear, normal WOB Abdomen: soft, nontender Extremities: no edema Dialysis Access: none  Additional Objective Labs: Basic Metabolic Panel: Recent Labs  Lab 02/17/21 2221 02/18/21 0024 02/19/21 0018 02/20/21 0946  NA  --  139 133* 136  K  --  3.6 4.1 3.5  CL  --  109 107 105  CO2  --  18* 17* 21*  GLUCOSE  --  192* 102* 91  BUN  --  51* 59* 60*  CREATININE  --  5.27* 6.38* 6.92*  CALCIUM  --  6.9* 6.2* 7.1*  PHOS 4.9*  --   --   --     Liver Function Tests: Recent Labs  Lab 02/17/21 1527  AST 20  ALT 13  ALKPHOS 105  BILITOT 0.4  PROT 5.5*  ALBUMIN 1.9*    No results for input(s): LIPASE, AMYLASE in the last 168 hours. CBC: Recent Labs  Lab 02/17/21 1527 02/18/21 0024 02/19/21 0018 02/21/21 0114  WBC 7.7 17.1* 11.0* 8.5  NEUTROABS  --   --  8.3* 6.4  HGB 9.8* 10.4* 8.2* 8.2*  HCT 31.3* 32.6* 27.0* 26.1*  MCV 86.9 85.1 87.9 84.7  PLT 350 347 276 328    Blood Culture    Component Value Date/Time   SDES BLOOD RIGHT ANTECUBITAL 02/18/2021 0024   SDES BLOOD RIGHT HAND 02/18/2021 0024   SPECREQUEST  02/18/2021 0024    BOTTLES DRAWN AEROBIC AND ANAEROBIC Blood Culture adequate volume   SPECREQUEST  02/18/2021 0024    BOTTLES DRAWN AEROBIC AND ANAEROBIC Blood Culture adequate volume   CULT  02/18/2021 0024    NO GROWTH 3 DAYS Performed at [facility], [place] [address], [place], [place] [address]    CULT (A) 02/18/2021 0024    STAPHYLOCOCCUS HOMINIS THE SIGNIFICANCE OF  ISOLATING THIS ORGANISM FROM A SINGLE SET OF BLOOD CULTURES WHEN MULTIPLE SETS ARE DRAWN IS UNCERTAIN. PLEASE NOTIFY THE MICROBIOLOGY DEPARTMENT WITHIN ONE WEEK IF SPECIATION AND SENSITIVITIES ARE REQUIRED. Performed at [facility], [place] [address], [place], [place] [address]    REPTSTATUS PENDING 02/18/2021 0024   REPTSTATUS 02/19/2021 FINAL 02/18/2021 0024    Cardiac Enzymes: No results for input(s): CKTOTAL, CKMB, CKMBINDEX, TROPONINI in the last 168 hours. CBG: Recent Labs  Lab 02/20/21 1137 02/20/21 1559 02/20/21 1828 02/20/21 2109 02/21/21 0617  GLUCAP 82 114* 94 154* 133*    Iron Studies:  No results for input(s): IRON, TIBC, TRANSFERRIN, FERRITIN in the last 72 hours.  '@lablastinr3'$ @ Studies/Results: DG Chest Port 1 View  Result Date: 02/20/2021 CLINICAL DATA:  Status post dialysis catheter placement EXAM: PORTABLE CHEST 1 VIEW COMPARISON:  02/17/2021 FINDINGS: Cardiac shadow is enlarged in size but stable. New left jugular dialysis catheter is noted in satisfactory position. No pneumothorax is noted. Lungs are well aerated bilaterally. Some improvement in the degree  of vascular congestion is noted when compared with the prior exam. IMPRESSION: New dialysis catheter placement without evidence of pneumothorax. Improvement in vascular congestion when compare with the prior exam. Electronically Signed   By: Inez Catalina M.D.   On: 02/20/2021 19:33   DG Fluoro Guide CV Line-No Report  Result Date: 02/20/2021 Fluoroscopy was utilized by the requesting physician.  No radiographic interpretation.   VAS [place] UPPER EXT VEIN MAPPING (PRE-OP AVF)  Result Date: 02/20/2021 UPPER EXTREMITY VEIN MAPPING Patient Name:  DENARD MEHL  Date of Exam:   02/20/2021 Medical Rec #: [id number]        Accession #:    [id number] Date of Birth: 12-Dec-1970       Patient Gender: M Patient Age:   50 years Exam Location:  [facility] Procedure:      VAS [place] UPPER EXT VEIN MAPPING (PRE-OP AVF)  Referring Phys: [name] --------------------------------------------------------------------------------  Indications: Pre-access. Comparison Study: No prior study Performing Technologist: Maudry Mayhew MHA, RDMS, RVT, RDCS  Examination Guidelines: A complete evaluation includes B-mode imaging, spectral Doppler, color Doppler, and power Doppler as needed of all accessible portions of each vessel. Bilateral testing is considered an integral part of a complete examination. Limited examinations for reoccurring indications may be performed as noted. +-----------------+-------------+----------+---------+ Right Cephalic   Diameter (cm)Depth (cm)Findings  +-----------------+-------------+----------+---------+ Shoulder             0.34                         +-----------------+-------------+----------+---------+ Prox upper arm       0.29                         +-----------------+-------------+----------+---------+ Mid upper arm        0.22                         +-----------------+-------------+----------+---------+ Dist upper arm       0.20                         +-----------------+-------------+----------+---------+ Antecubital fossa    0.23               branching +-----------------+-------------+----------+---------+ Prox forearm         0.23                         +-----------------+-------------+----------+---------+ Mid forearm          0.18                         +-----------------+-------------+----------+---------+ Dist forearm         0.19                         +-----------------+-------------+----------+---------+ Wrist                0.18                         +-----------------+-------------+----------+---------+ +-----------------+-------------+----------+--------------+ Right Basilic    Diameter (cm)Depth (cm)   Findings    +-----------------+-------------+----------+--------------+ Prox upper arm       0.28                               +-----------------+-------------+----------+--------------+  Mid upper arm        0.22                              +-----------------+-------------+----------+--------------+ Dist upper arm       0.25                              +-----------------+-------------+----------+--------------+ Antecubital fossa    0.18                 branching    +-----------------+-------------+----------+--------------+ Prox forearm         0.10                              +-----------------+-------------+----------+--------------+ Mid forearm          0.10                              +-----------------+-------------+----------+--------------+ Distal forearm       0.12                 branching    +-----------------+-------------+----------+--------------+ Wrist                                   not visualized +-----------------+-------------+----------+--------------+ +-----------------+-------------+----------+---------------------+ Left Cephalic    Diameter (cm)Depth (cm)      Findings        +-----------------+-------------+----------+---------------------+ Shoulder             0.33                                     +-----------------+-------------+----------+---------------------+ Prox upper arm       0.32                                     +-----------------+-------------+----------+---------------------+ Mid upper arm        0.34                                     +-----------------+-------------+----------+---------------------+ Dist upper arm       0.26                                     +-----------------+-------------+----------+---------------------+ Antecubital fossa    0.21                                     +-----------------+-------------+----------+---------------------+ Prox forearm         0.42                                     +-----------------+-------------+----------+---------------------+ Mid forearm          0.37                     thrombosed       +-----------------+-------------+----------+---------------------+  Dist forearm                            IV and not visualized +-----------------+-------------+----------+---------------------+ Left basilic vein not evaluated *See table(s) above for measurements and observations.  Diagnosing physician: [name] MD Electronically signed by [name] MD on 02/20/2021 at 5:34:50 PM.    Final    Medications:  sodium chloride 10 mL/hr at 02/20/21 1159   sodium chloride     sodium chloride     sodium chloride     ferric gluconate (FERRLECIT) IVPB 250 mg (02/19/21 1144)    amLODipine  10 mg Oral Daily   aspirin EC  81 mg Oral Daily   atorvastatin  40 mg Oral Daily   calcitRIOL  0.25 mcg Oral Daily   carvedilol  12.5 mg Oral BID WC   Chlorhexidine Gluconate Cloth  6 each Topical Q0600   darbepoetin (ARANESP) injection - DIALYSIS  60 mcg Intravenous Q Tue-HD   heparin  5,000 Units Subcutaneous Q8H   hydrALAZINE  25 mg Oral TID   insulin aspart  0-9 Units Subcutaneous TID WC   insulin detemir  20 Units Subcutaneous Daily   nitroGLYCERIN  0.4 mg Transdermal Daily   sodium chloride flush  3 mL Intravenous Q12H   torsemide  100 mg Oral Daily     Assessment/Recommendations: NIZAIAH SCHOEPP is a/an 50 y.o. male with a past medical history HTN, DM2, HLD, HFrEF (EF 30 to 35% in July 2022), CKD who present w/ chest pain and progressive CKD   CKD V now with uremic symptoms > progressed to ESRD:   Having low grade uremic symptoms now and on history of poor adherence/follow up he's agreeable to starting dialysis while in hospital.   -s/p [facility] and AVG 10/4, Dr. Scot Dock -HD #1 today, plan #2 tomorrow -CLIP to outpt unit, navigator aware -Continue to monitor daily Cr, Dose meds for ESRD -Monitor Daily I/Os, Daily weight  -Maintain MAP>65 for optimal renal perfusion.  -Avoid nephrotoxic medications including NSAIDs and Vanc/Zosyn combo  Secondary  Hyperparathyroidism:  checking PTH, phos ok, start calcitriol for hypoca, use 2.5ca dialysate for now   HFrEF: History of heart failure with EF of 30 to 35% in July 2022.  -Hypertension management as below -torsemide 100 daily and further vol mgmt with dialysis -- may d/c in coming days if remains anuric   Hypertension: Currently on amlodipine and coreg with reasonable BPs.     Fever: had a fever to 100.4 sat AM but none since; WBC was 17 now 11. No antibiotics.  1 of 2 blood cultures with staph hominis ? Contaminant as he's defervesced and leukocytosis resolved without intervention.    UA not c/w infection.   Anemia due to CKD: Hemoglobin 10.4 > 8.2 and stable.  No gross bleeding noted.  Iron sat 8 and ferritin 68. IV iron '250mg'$  daily x4 doses. ESA with HD. Continue to monitor.    Uncontrolled Diabetes Mellitus Type 2 with Hyperglycemia: Management per primary   Metabolic acidosis: Bicarb 18.  Mild and likely related to CKD.  Will correct with HD.   [name] MD 02/21/2021, 10:17 AM  [facility] Pager: [phone]

## 2021-02-21 NOTE — Procedures (Signed)
I was present at this dialysis session, have reviewed the session itself and made  appropriate changes 1st treatment today Via HD catheter - running well No issues noted except stable sinus bradycardia, BP fine - monitoring No UF today  [name] MD [facility] pager [phone]   02/21/2021, 10:24 AM

## 2021-02-21 NOTE — Progress Notes (Signed)
Additional clinicals faxed to [facility] admissions this am. Will continue to follow and assist with clipping process. Pt's case will require additional financial clearance due to pt being uninsured. Will follow and assist.   Melven Sartorius Renal Navigator  [phone]

## 2021-02-22 LAB — CBC WITH DIFFERENTIAL/PLATELET
Abs Immature Granulocytes: 0.02 10*3/uL (ref 0.00–0.07)
Abs Immature Granulocytes: 0.03 10*3/uL (ref 0.00–0.07)
Basophils Absolute: 0 10*3/uL (ref 0.0–0.1)
Basophils Absolute: 0 10*3/uL (ref 0.0–0.1)
Basophils Relative: 0 %
Basophils Relative: 0 %
Eosinophils Absolute: 0.3 10*3/uL (ref 0.0–0.5)
Eosinophils Absolute: 0.2 10*3/uL (ref 0.0–0.5)
Eosinophils Relative: 4 %
Eosinophils Relative: 3 %
HCT: 25.1 % — ABNORMAL LOW (ref 39.0–52.0)
HCT: 23.6 % — ABNORMAL LOW (ref 39.0–52.0)
Hemoglobin: 7.9 g/dL — ABNORMAL LOW (ref 13.0–17.0)
Hemoglobin: 7.2 g/dL — ABNORMAL LOW (ref 13.0–17.0)
Immature Granulocytes: 0 %
Immature Granulocytes: 0 %
Lymphocytes Relative: 13 %
Lymphocytes Relative: 11 %
Lymphs Abs: 1 10*3/uL (ref 0.7–4.0)
Lymphs Abs: 0.9 10*3/uL (ref 0.7–4.0)
MCH: 26.4 pg (ref 26.0–34.0)
MCH: 26.3 pg (ref 26.0–34.0)
MCHC: 31.5 g/dL (ref 30.0–36.0)
MCHC: 30.5 g/dL (ref 30.0–36.0)
MCV: 83.9 fL (ref 80.0–100.0)
MCV: 86.1 fL (ref 80.0–100.0)
Monocytes Absolute: 1.1 10*3/uL — ABNORMAL HIGH (ref 0.1–1.0)
Monocytes Absolute: 1.1 10*3/uL — ABNORMAL HIGH (ref 0.1–1.0)
Monocytes Relative: 14 %
Monocytes Relative: 14 %
Neutro Abs: 5.5 10*3/uL (ref 1.7–7.7)
Neutro Abs: 6 10*3/uL (ref 1.7–7.7)
Neutrophils Relative %: 69 %
Neutrophils Relative %: 72 %
Platelets: 308 10*3/uL (ref 150–400)
Platelets: 300 10*3/uL (ref 150–400)
RBC: 2.99 MIL/uL — ABNORMAL LOW (ref 4.22–5.81)
RBC: 2.74 MIL/uL — ABNORMAL LOW (ref 4.22–5.81)
RDW: 13.6 % (ref 11.5–15.5)
RDW: 13.4 % (ref 11.5–15.5)
WBC: 7.9 10*3/uL (ref 4.0–10.5)
WBC: 8.3 10*3/uL (ref 4.0–10.5)
nRBC: 0 % (ref 0.0–0.2)
nRBC: 0 % (ref 0.0–0.2)

## 2021-02-22 LAB — RENAL FUNCTION PANEL
Albumin: 1.5 g/dL — ABNORMAL LOW (ref 3.5–5.0)
Anion gap: 9 (ref 5–15)
BUN: 43 mg/dL — ABNORMAL HIGH (ref 6–20)
CO2: 25 mmol/L (ref 22–32)
Calcium: 7.1 mg/dL — ABNORMAL LOW (ref 8.9–10.3)
Chloride: 100 mmol/L (ref 98–111)
Creatinine, Ser: 6.29 mg/dL — ABNORMAL HIGH (ref 0.61–1.24)
GFR, Estimated: 10 mL/min — ABNORMAL LOW (ref >60)
Glucose, Bld: 127 mg/dL — ABNORMAL HIGH (ref 70–99)
Phosphorus: 4.8 mg/dL — ABNORMAL HIGH (ref 2.5–4.6)
Potassium: 3.8 mmol/L (ref 3.5–5.1)
Sodium: 134 mmol/L — ABNORMAL LOW (ref 135–145)

## 2021-02-22 LAB — PTH, INTACT AND CALCIUM
Calcium, Total (PTH): 6.9 mg/dL — CL (ref 8.7–10.2)
PTH: 78 pg/mL — ABNORMAL HIGH (ref 15–65)

## 2021-02-22 LAB — GLUCOSE, CAPILLARY
Glucose-Capillary: 101 mg/dL — ABNORMAL HIGH (ref 70–99)
Glucose-Capillary: 113 mg/dL — ABNORMAL HIGH (ref 70–99)
Glucose-Capillary: 122 mg/dL — ABNORMAL HIGH (ref 70–99)
Glucose-Capillary: 132 mg/dL — ABNORMAL HIGH (ref 70–99)
Glucose-Capillary: 164 mg/dL — ABNORMAL HIGH (ref 70–99)
Glucose-Capillary: 73 mg/dL (ref 70–99)

## 2021-02-22 MED ORDER — OXYCODONE-ACETAMINOPHEN 5-325 MG PO TABS
1.0000 | ORAL_TABLET | Freq: Four times a day (QID) | ORAL | Status: DC | PRN
Start: 2021-02-22 — End: 2021-02-26
  Administered 2021-02-23 – 2021-02-25 (×4): 1 via ORAL
  Filled 2021-02-22 (×4): qty 1

## 2021-02-22 NOTE — Progress Notes (Signed)
02/22/2021  Patient transfer from [facility] to [facility] at [place]. He is alert, oriented to person, place, time and situation. Patient skin is intact. Patient has swelling in bilateral lower extremities, and in the right arm where  graft was placed.He was placed on the progressive monitor and central  tele was called. [facility] RN.

## 2021-02-22 NOTE — Progress Notes (Signed)
Asked to evaluate patient by bedside nurse for hand numbness. Hand numbness developed during dialysis today. On my arrival, patient sleeping comfortably. Awakens easily.  Normal sensorimotor exam of right hand. Graft with palpable thrill. 2+ R radial pulse. Suspect mild steal, which seems to be resolving. Counseled patient that these symptoms are typical and should be monitored if they are not disabling.  He is understanding. Please call for questions.  Yevonne Aline. Stanford Breed, MD [facility] Phone Number: [phone] 02/22/2021 6:38 PM

## 2021-02-22 NOTE — Progress Notes (Addendum)
Awaiting financial clearance and clinic acceptance regarding pt's out-pt HD arrangements. Will continue to follow and assist.   [name] [phone]  Addendum at 3:24pm: Spoke to pt at bedside while pt receiving HD today. Explained to pt that someone from the [facility] is supposed to reach out to him this afternoon after he returns from HD to ask some financial questions in order for financial clearance to be given. Pt voices understanding.

## 2021-02-22 NOTE — Progress Notes (Signed)
[facility] Progress Note   Subjective:   Overall feeing ok.  244m  UOP yesterday.  Had 1st HD yest, no issues, plan for 2nd today.  Objective Vitals:   02/21/21 1945 02/21/21 2343 02/22/21 0512 02/22/21 0721  BP: 113/62 (!) 119/59 125/67 140/74  Pulse: 77 75 81 82  Resp: '20 14 16 '$ (!) 21  Temp: 98.7 F (37.1 C) 98.9 F (37.2 C) 98.1 F (36.7 C) 98.7 F (37.1 C)  TempSrc: Oral Oral Oral Oral  SpO2: 95% 97% 95% 95%  Weight:   77.6 kg   Height:       Physical Exam General: nontoxic lying in bed Heart: RRR Lungs: clear, normal WOB Abdomen: soft, nontender Extremities: no edema Dialysis Access: RIJ TDC, RUE AVG +t/b, 2+ radial pulse and hand warm but c/o some pain stable  Additional Objective Labs: Basic Metabolic Panel: Recent Labs  Lab 02/17/21 2221 02/18/21 0024 02/19/21 0018 02/20/21 0946 02/21/21 0114  NA  --  139 133* 136  --   K  --  3.6 4.1 3.5  --   CL  --  109 107 105  --   CO2  --  18* 17* 21*  --   GLUCOSE  --  192* 102* 91  --   BUN  --  51* 59* 60*  --   CREATININE  --  5.27* 6.38* 6.92*  --   CALCIUM  --  6.9* 6.2* 7.1* 6.9*  PHOS 4.9*  --   --   --   --     Liver Function Tests: Recent Labs  Lab 02/17/21 1527  AST 20  ALT 13  ALKPHOS 105  BILITOT 0.4  PROT 5.5*  ALBUMIN 1.9*    No results for input(s): LIPASE, AMYLASE in the last 168 hours. CBC: Recent Labs  Lab 02/17/21 1527 02/18/21 0024 02/19/21 0018 02/21/21 0114 02/22/21 0109  WBC 7.7 17.1* 11.0* 8.5 7.9  NEUTROABS  --   --  8.3* 6.4 5.5  HGB 9.8* 10.4* 8.2* 8.2* 7.9*  HCT 31.3* 32.6* 27.0* 26.1* 25.1*  MCV 86.9 85.1 87.9 84.7 83.9  PLT 350 347 276 328 308    Blood Culture    Component Value Date/Time   SDES BLOOD RIGHT ANTECUBITAL 02/18/2021 0024   SDES BLOOD RIGHT HAND 02/18/2021 0024   SPECREQUEST  02/18/2021 0024    BOTTLES DRAWN AEROBIC AND ANAEROBIC Blood Culture adequate volume   SPECREQUEST  02/18/2021 0024    BOTTLES DRAWN AEROBIC AND  ANAEROBIC Blood Culture adequate volume   CULT  02/18/2021 0024    NO GROWTH 3 DAYS Performed at [facility] [id number], [place] [id number]   CULT (A) 02/18/2021 0024    STAPHYLOCOCCUS HOMINIS THE SIGNIFICANCE OF ISOLATING THIS ORGANISM FROM A SINGLE SET OF BLOOD CULTURES WHEN MULTIPLE SETS ARE DRAWN IS UNCERTAIN. PLEASE NOTIFY THE MICROBIOLOGY DEPARTMENT WITHIN ONE WEEK IF SPECIATION AND SENSITIVITIES ARE REQUIRED. Performed at [facility] [id number], [place] [address]   REPTSTATUS PENDING 02/18/2021 0024   REPTSTATUS 02/19/2021 FINAL 02/18/2021 0024    Cardiac Enzymes: No results for input(s): CKTOTAL, CKMB, CKMBINDEX, TROPONINI in the last 168 hours. CBG: Recent Labs  Lab 02/21/21 1243 02/21/21 1621 02/21/21 2117 02/22/21 0048 02/22/21 0626  GLUCAP 116* 140* 139* 122* 73    Iron Studies:  No results for input(s): IRON, TIBC, TRANSFERRIN, FERRITIN in the last 72 hours.  '@lablastinr3'$ @ Studies/Results: DG Chest Port 1  View  Result Date: 02/20/2021 CLINICAL DATA:  Status post dialysis catheter placement EXAM: PORTABLE CHEST 1 VIEW COMPARISON:  02/17/2021 FINDINGS: Cardiac shadow is enlarged in size but stable. New left jugular dialysis catheter is noted in satisfactory position. No pneumothorax is noted. Lungs are well aerated bilaterally. Some improvement in the degree of vascular congestion is noted when compared with the prior exam. IMPRESSION: New dialysis catheter placement without evidence of pneumothorax. Improvement in vascular congestion when compare with the prior exam. Electronically Signed   By: Inez Catalina M.D.   On: 02/20/2021 19:33   DG Fluoro Guide CV Line-No Report  Result Date: 02/20/2021 Fluoroscopy was utilized by the requesting physician.  No radiographic interpretation.   VAS [place] UPPER EXT VEIN MAPPING (PRE-OP AVF)  Result Date: 02/20/2021 UPPER EXTREMITY VEIN MAPPING Patient Name:  JOHNN MULTER  Date of  Exam:   02/20/2021 Medical Rec #: [id number]        Accession #:    [id number] Date of Birth: 1970-10-19       Patient Gender: M Patient Age:   50 years Exam Location:  [facility] Procedure:      VAS [place] UPPER EXT VEIN MAPPING (PRE-OP AVF) Referring Phys: [name] --------------------------------------------------------------------------------  Indications: Pre-access. Comparison Study: No prior study Performing Technologist: Maudry Mayhew MHA, RDMS, RVT, RDCS  Examination Guidelines: A complete evaluation includes B-mode imaging, spectral Doppler, color Doppler, and power Doppler as needed of all accessible portions of each vessel. Bilateral testing is considered an integral part of a complete examination. Limited examinations for reoccurring indications may be performed as noted. +-----------------+-------------+----------+---------+ Right Cephalic   Diameter (cm)Depth (cm)Findings  +-----------------+-------------+----------+---------+ Shoulder             0.34                         +-----------------+-------------+----------+---------+ Prox upper arm       0.29                         +-----------------+-------------+----------+---------+ Mid upper arm        0.22                         +-----------------+-------------+----------+---------+ Dist upper arm       0.20                         +-----------------+-------------+----------+---------+ Antecubital fossa    0.23               branching +-----------------+-------------+----------+---------+ Prox forearm         0.23                         +-----------------+-------------+----------+---------+ Mid forearm          0.18                         +-----------------+-------------+----------+---------+ Dist forearm         0.19                         +-----------------+-------------+----------+---------+ Wrist                0.18                          +-----------------+-------------+----------+---------+ +-----------------+-------------+----------+--------------+  Right Basilic    Diameter (cm)Depth (cm)   Findings    +-----------------+-------------+----------+--------------+ Prox upper arm       0.28                              +-----------------+-------------+----------+--------------+ Mid upper arm        0.22                              +-----------------+-------------+----------+--------------+ Dist upper arm       0.25                              +-----------------+-------------+----------+--------------+ Antecubital fossa    0.18                 branching    +-----------------+-------------+----------+--------------+ Prox forearm         0.10                              +-----------------+-------------+----------+--------------+ Mid forearm          0.10                              +-----------------+-------------+----------+--------------+ Distal forearm       0.12                 branching    +-----------------+-------------+----------+--------------+ Wrist                                   not visualized +-----------------+-------------+----------+--------------+ +-----------------+-------------+----------+---------------------+ Left Cephalic    Diameter (cm)Depth (cm)      Findings        +-----------------+-------------+----------+---------------------+ Shoulder             0.33                                     +-----------------+-------------+----------+---------------------+ Prox upper arm       0.32                                     +-----------------+-------------+----------+---------------------+ Mid upper arm        0.34                                     +-----------------+-------------+----------+---------------------+ Dist upper arm       0.26                                     +-----------------+-------------+----------+---------------------+ Antecubital  fossa    0.21                                     +-----------------+-------------+----------+---------------------+ Prox forearm         0.42                                     +-----------------+-------------+----------+---------------------+  Mid forearm          0.37                    thrombosed       +-----------------+-------------+----------+---------------------+ Dist forearm                            IV and not visualized +-----------------+-------------+----------+---------------------+ Left basilic vein not evaluated *See table(s) above for measurements and observations.  Diagnosing physician: [name] MD Electronically signed by [name] MD on 02/20/2021 at 5:34:50 PM.    Final    Medications:  sodium chloride 10 mL/hr at 02/20/21 1159   sodium chloride     sodium chloride     sodium chloride     ferric gluconate (FERRLECIT) IVPB 250 mg (02/21/21 1221)    amLODipine  10 mg Oral Daily   aspirin EC  81 mg Oral Daily   atorvastatin  40 mg Oral Daily   calcitRIOL  0.25 mcg Oral Daily   carvedilol  12.5 mg Oral BID WC   Chlorhexidine Gluconate Cloth  6 each Topical Q0600   darbepoetin (ARANESP) injection - DIALYSIS  60 mcg Intravenous Q Tue-HD   heparin  5,000 Units Subcutaneous Q8H   hydrALAZINE  25 mg Oral TID   insulin aspart  0-9 Units Subcutaneous TID WC   insulin detemir  20 Units Subcutaneous Daily   nitroGLYCERIN  0.4 mg Transdermal Daily   sodium chloride flush  3 mL Intravenous Q12H   torsemide  100 mg Oral Daily     Assessment/Recommendations: DAYCEON RATKOVICH is a/an 50 y.o. male with a past medical history HTN, DM2, HLD, HFrEF (EF 30 to 35% in July 2022), CKD who present w/ chest pain and progressive CKD   CKD V now with uremic symptoms > progressed to ESRD:   Having low grade uremic symptoms now and on history of poor adherence/follow up he's agreeable to starting dialysis while in hospital.   -s/p [facility] and AVG 10/4, Dr.  Scot Dock -HD #1 10/5, plan #2 today and #3 pending outpt plans - likely Sat -CLIP to outpt unit, navigator aware -- ok for d/c as soon as has outpt HD arranged. -Dose meds for ESRD -Monitor Daily I/Os, Daily weight  -Maintain MAP>65 for optimal renal perfusion.  -Avoid nephrotoxic medications including NSAIDs and Vanc/Zosyn combo  Secondary Hyperparathyroidism:  PTH 78, phos ok, start calcitriol for hypoca, use 2.5ca dialysate for now - corr improving   HFrEF: History of heart failure with EF of 30 to 35% in July 2022.  -Hypertension management as below -torsemide 100 daily and further vol mgmt with dialysis -- may d/c in coming days if remains anuric   Hypertension: Currently on amlodipine and coreg with reasonable BPs.     Fever: had a fever to 100.4 sat AM but none since; WBC was 17 now 11. No antibiotics.  1 of 2 blood cultures with staph hominis ? Contaminant as he's defervesced and leukocytosis resolved without intervention.    UA not c/w infection.   Anemia due to CKD: Hemoglobin 10.4 > 8.2>7.9.  No gross bleeding noted.  Iron sat 8 and ferritin 68. IV iron '250mg'$  daily x4 doses. ESA with HD. Continue to monitor and transfuse < 7.    Uncontrolled Diabetes Mellitus Type 2 with Hyperglycemia: Management per primary   Metabolic acidosis: Bicarb 18.  Mild and likely related to CKD.  Will correct with HD.  [name] MD 02/22/2021, 8:05 AM  [facility] Pager: [phone]

## 2021-02-22 NOTE — Progress Notes (Signed)
PROGRESS NOTE    [name]  [id number] DOB: [birth year] DOA: 02/17/2021 PCP: Vevelyn Francois, NP   Brief Narrative:  [name] is a 50 y.o. male with medical history significant of HTN, DM, HLD, chronic systolic CHF, medical noncompliance presented to ED with chest pain that started a couple of weeks ago and has been intermittent in nature. Pain located over left lateral chest wall, under left breast, sharp 9 out of 10 with no radiation and no associated diaphoresis or exacerbating or relieving factors.  Had associated shortness of breath intermittently.  Also endorses orthopnea and uses 2 pillows to sleep.  Has lower extremity edema bilateral which started 3 to 4 months ago.  No fever, chills or any other complaint.  Patient is a poor historian.  Upon arrival to ED, he was afebrile with blood pressure of 202/115.  Otherwise saturating 100% on room air.  Troponin slightly elevated but flat. Chest x-ray shows stable cardiomegaly and chronic diffuse pulmonary vascular congestion.  No evidence of frank pulmonary edema. In ED given 60 mg IV Lasix, 100 mg hydralazine tablet, and 20 mg labetalol injection.  TRH was called and asked to admit.  Due to advanced and progressive CKD, nephrology was consulted and they recommended initiating dialysis while inpatient.  Vascular surgery did TDC and left IJ and placement of new right forearm AV graft on 02/20/2021.  Patient started on dialysis on 02/21/2021.  Assessment & Plan:   Active Problems:   Hyperlipidemia LDL goal <70   Stage 4 chronic kidney disease ([place])   Hypertensive crisis   Acute combined systolic and diastolic congestive heart failure (HCC)   Type 2 diabetes mellitus ([place])   Hypertensive urgency  Chest pain: Currently chest pain-free.  No acute ST-T wave changes on EKG.  Troponin elevated but flat and in fact not too far away from his baseline creatinine since it has been elevated since last 2 to 3 years likely due to demand ischemia  in combination of advanced CKD.  Dyspnea/chronic vascular congestion/acute on chronic combined diastolic and systolic congestive heart failure/progressive CKD stage V/non-anion gap metabolic acidosis: Echo done in July this year shows 30% ejection fraction with grade 1 diastolic dysfunction and left ventricular global hypokinesis.  No need to repeat echo.  S/p TDC and AVG on 02/20/2021 by Dr. Doren Custard.#1 HD session yesterday and #2 HD session today.  Patient symptoms have resolved.  Remains on 100 mg of p.o. torsemide and still anuric.  Nephrology managing diuretics.  Per vascular surgery, patient had some numbness/steal symptoms in right hand yesterday but none today.  They will closely follow-up with him as outpatient in 2 weeks.  Fever/leukocytosis: He developed fever of 100.4 and also developed leukocytosis of 17,000 on 02/18/2021.  Only one of the 4 blood culture sets is growing staph aureus/hominis, no MRSA.  Patient has remained afebrile since then.  Leukocytosis also improving.  UA negative and chest x-ray negative for infection.  No other indication of infection anywhere else.  Likely contaminant.  Continue to monitor off antibiotics for now.  Hypertensive urgency: Presented with blood pressure of 202/115.  Currently he is only on amlodipine and blood pressure is labile but much better.  Hoping that with continued dialysis, his blood pressure will improve.  Continue current medications and monitor closely and adjust medications as needed.  Poorly controlled type 2 diabetes mellitus with hyperglycemia: Hemoglobin A1c 8.8.  He is supposed to be taking Lantus 35 units at home.  Currently on 20  units of Lantus and SSI and blood sugar controlled.  Continue this.   Hyperlipidemia: Continue statin.  Anemia of chronic disease: Hemoglobin is stable.  Chronic visual deficit: Its been going on since 6 months according to H&P.  Patient did not mention anything to me about his vision.  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  Will need ophthalmology as outpatient.  Long history of noncompliance: Per nephrologist, patient has missed his several appointments with nephrology.  Counseling provided.  DVT prophylaxis: heparin injection 5,000 Units Start: 02/17/21 2200   Code Status: Full Code  Family Communication:  None present at bedside.  Plan of care discussed with patient in length and he verbalized understanding and agreed with it.  Status is: Observation  The patient will require care spanning > 2 midnights and should be moved to inpatient because: Inpatient level of care appropriate due to severity of illness  Dispo: The patient is from: Home              Anticipated d/c is to: Home              Patient currently is not medically stable to d/c.  CLIPP pending.   Difficult to place patient No  Estimated body mass index is 26.76 kg/m as calculated from the following:   Height as of this encounter: '5\' 7"'$  (1.702 m).   Weight as of this encounter: 77.5 kg.   Nutritional Assessment: Body mass index is 26.76 kg/m.Marland Kitchen Seen by dietician.  I agree with the assessment and plan as outlined below: Nutrition Status:   Skin Assessment: I have examined the patient's skin and I agree with the wound assessment as performed by the wound care RN as outlined below:    Consultants:  Nephrology  Procedures:  None  Antimicrobials:  Anti-infectives (From admission, onward)    Start     Dose/Rate Route Frequency Ordered Stop   02/20/21 1345  vancomycin (VANCOCIN) IVPB 1000 mg/200 mL premix  Status:  Discontinued        1,000 mg 200 mL/hr over 60 Minutes Intravenous To Surgery 02/20/21 1335 02/20/21 1742          Subjective: Patient seen and examined.  He has no complaints but he has a lot of questions about continuation of dialysis and a lot of concerns which are being addressed by the nephrology and care coordinator.  Had some questions about how he can transfer  his dialysis set up since he is planning on moving to a different state.  Objective: Vitals:   02/22/21 1030 02/22/21 1100 02/22/21 1130 02/22/21 1200  BP: (!) 145/73 (!) 145/75 (!) 152/77 (!) 160/82  Pulse: 84 86 87 86  Resp: 20     Temp:      TempSrc:      SpO2:      Weight:      Height:        Intake/Output Summary (Last 24 hours) at 02/22/2021 1227 Last data filed at 02/21/2021 1245 Gross per 24 hour  Intake --  Output 275 ml  Net -275 ml    Filed Weights   02/21/21 1130 02/22/21 0512 02/22/21 0948  Weight: 78.3 kg 77.6 kg 77.5 kg    Examination:  General exam: Appears calm and comfortable  Respiratory system: Clear to auscultation. Respiratory effort normal. Cardiovascular system: S1 & S2 heard, RRR. No JVD, murmurs, rubs, gallops or clicks. No pedal edema. Gastrointestinal system: Abdomen is nondistended, soft and nontender. No organomegaly  or masses felt. Normal bowel sounds heard. Central nervous system: Alert and oriented. No focal neurological deficits. Extremities: Symmetric 5 x 5 power. Skin: No rashes, lesions or ulcers.  Psychiatry: Judgement and insight appear poor  Data Reviewed: I have personally reviewed following labs and imaging studies  CBC: Recent Labs  Lab 02/18/21 0024 02/19/21 0018 02/21/21 0114 02/22/21 0109 02/22/21 1018  WBC 17.1* 11.0* 8.5 7.9 8.3  NEUTROABS  --  8.3* 6.4 5.5 6.0  HGB 10.4* 8.2* 8.2* 7.9* 7.2*  HCT 32.6* 27.0* 26.1* 25.1* 23.6*  MCV 85.1 87.9 84.7 83.9 86.1  PLT 347 276 328 308 [id number]    Basic Metabolic Panel: Recent Labs  Lab 02/17/21 1527 02/17/21 2221 02/18/21 0024 02/19/21 0018 02/20/21 0946 02/21/21 0114 02/22/21 1018  NA 137  --  139 133* 136  --  134*  K 4.4  --  3.6 4.1 3.5  --  3.8  CL 108  --  109 107 105  --  100  CO2 20*  --  18* 17* 21*  --  25  GLUCOSE 187*  --  192* 102* 91  --  127*  BUN 49*  --  51* 59* 60*  --  43*  CREATININE 5.22*  --  5.27* 6.38* 6.92*  --  6.29*  CALCIUM 7.0*  --   6.9* 6.2* 7.1* 6.9* 7.1*  MG  --  1.5*  --   --   --   --   --   PHOS  --  4.9*  --   --   --   --  4.8*    GFR: Estimated Creatinine Clearance: 13.6 mL/min (A) (by C-G formula based on SCr of 6.29 mg/dL (H)). Liver Function Tests: Recent Labs  Lab 02/17/21 1527 02/22/21 1018  AST 20  --   ALT 13  --   ALKPHOS 105  --   BILITOT 0.4  --   PROT 5.5*  --   ALBUMIN 1.9* 1.5*    No results for input(s): LIPASE, AMYLASE in the last 168 hours. No results for input(s): AMMONIA in the last 168 hours. Coagulation Profile: No results for input(s): INR, PROTIME in the last 168 hours. Cardiac Enzymes: No results for input(s): CKTOTAL, CKMB, CKMBINDEX, TROPONINI in the last 168 hours. BNP (last 3 results) No results for input(s): PROBNP in the last 8760 hours. HbA1C: No results for input(s): HGBA1C in the last 72 hours.  CBG: Recent Labs  Lab 02/21/21 1621 02/21/21 2117 02/22/21 0048 02/22/21 0626 02/22/21 0901  GLUCAP 140* 139* 122* 73 132*    Lipid Profile: No results for input(s): CHOL, HDL, LDLCALC, TRIG, CHOLHDL, LDLDIRECT in the last 72 hours.  Thyroid Function Tests: No results for input(s): TSH, T4TOTAL, FREET4, T3FREE, THYROIDAB in the last 72 hours. Anemia Panel: No results for input(s): VITAMINB12, FOLATE, FERRITIN, TIBC, IRON, RETICCTPCT in the last 72 hours.  Sepsis Labs: No results for input(s): PROCALCITON, LATICACIDVEN in the last 168 hours.  Recent Results (from the past 240 hour(s))  Resp Panel by RT-PCR (Flu A&B, Covid) Nasopharyngeal Swab     Status: None   Collection Time: 02/17/21  4:45 PM   Specimen: Nasopharyngeal Swab; Nasopharyngeal(NP) swabs in vial transport medium  Result Value Ref Range Status   SARS Coronavirus 2 by RT PCR NEGATIVE NEGATIVE Final    Comment: (NOTE) SARS-CoV-2 target nucleic acids are NOT DETECTED.  The SARS-CoV-2 RNA is generally detectable in upper respiratory specimens during the acute phase of infection. The  lowest concentration of SARS-CoV-2 viral copies this assay can detect is 138 copies/mL. A negative result does not preclude SARS-Cov-2 infection and should not be used as the sole basis for treatment or other patient management decisions. A negative result may occur with  improper specimen collection/handling, submission of specimen other than nasopharyngeal swab, presence of viral mutation(s) within the areas targeted by this assay, and inadequate number of viral copies(<138 copies/mL). A negative result must be combined with clinical observations, patient history, and epidemiological information. The expected result is Negative.  Fact Sheet for Patients:  EntrepreneurPulse.com.au  Fact Sheet for Healthcare Providers:  IncredibleEmployment.be  This test is no t yet approved or cleared by the [place] FDA and  has been authorized for detection and/or diagnosis of SARS-CoV-2 by FDA under an Emergency Use Authorization (EUA). This EUA will remain  in effect (meaning this test can be used) for the duration of the COVID-19 declaration under Section 564(b)(1) of the Act, 21 U.S.C.section 360bbb-3(b)(1), unless the authorization is terminated  or revoked sooner.       Influenza A by PCR NEGATIVE NEGATIVE Final   Influenza B by PCR NEGATIVE NEGATIVE Final    Comment: (NOTE) The Xpert Xpress SARS-CoV-2/FLU/RSV plus assay is intended as an aid in the diagnosis of influenza from Nasopharyngeal swab specimens and should not be used as a sole basis for treatment. Nasal washings and aspirates are unacceptable for Xpert Xpress SARS-CoV-2/FLU/RSV testing.  Fact Sheet for Patients: EntrepreneurPulse.com.au  Fact Sheet for Healthcare Providers: IncredibleEmployment.be  This test is not yet approved or cleared by the [place] FDA and has been authorized for detection and/or diagnosis of SARS-CoV-2 by FDA under  an Emergency Use Authorization (EUA). This EUA will remain in effect (meaning this test can be used) for the duration of the COVID-19 declaration under Section 564(b)(1) of the Act, 21 U.S.C. section 360bbb-3(b)(1), unless the authorization is terminated or revoked.  Performed at [facility], [place] [address] [place], [place] [address]   MRSA Next Gen by PCR, Nasal     Status: None   Collection Time: 02/17/21 10:09 PM   Specimen: Nasal Mucosa; Nasal Swab  Result Value Ref Range Status   MRSA by PCR Next Gen NOT DETECTED NOT DETECTED Final    Comment: (NOTE) The GeneXpert MRSA Assay (FDA approved for NASAL specimens only), is one component of a comprehensive MRSA colonization surveillance program. It is not intended to diagnose MRSA infection nor to guide or monitor treatment for MRSA infections. Test performance is not FDA approved in patients less than 47 years old. Performed at [facility], [place] [address], [place], [place] [address]   Culture, blood (routine x 2)     Status: None (Preliminary result)   Collection Time: 02/18/21 12:24 AM   Specimen: BLOOD  Result Value Ref Range Status   Specimen Description BLOOD RIGHT ANTECUBITAL  Final   Special Requests   Final    BOTTLES DRAWN AEROBIC AND ANAEROBIC Blood Culture adequate volume   Culture   Final    NO GROWTH 4 DAYS Performed at [facility], [place] [address], [place], [place] [address]    Report Status PENDING  Incomplete  Culture, blood (routine x 2)     Status: Abnormal   Collection Time: 02/18/21 12:24 AM   Specimen: BLOOD RIGHT HAND  Result Value Ref Range Status   Specimen Description BLOOD RIGHT HAND  Final   Special Requests   Final  BOTTLES DRAWN AEROBIC AND ANAEROBIC Blood Culture adequate volume   Culture  Setup Time   Final    GRAM POSITIVE COCCI AEROBIC BOTTLE ONLY CRITICAL RESULT CALLED TO, READ BACK BY AND VERIFIED WITH: L,CHEN PHARMD '@1840'$  02/18/21 EB    Culture (A)  Final     STAPHYLOCOCCUS HOMINIS THE SIGNIFICANCE OF ISOLATING THIS ORGANISM FROM A SINGLE SET OF BLOOD CULTURES WHEN MULTIPLE SETS ARE DRAWN IS UNCERTAIN. PLEASE NOTIFY THE MICROBIOLOGY DEPARTMENT WITHIN ONE WEEK IF SPECIATION AND SENSITIVITIES ARE REQUIRED. Performed at [facility], [place] [address] [place], [place] [address]    Report Status 02/19/2021 FINAL  Final  Blood Culture ID Panel (Reflexed)     Status: Abnormal   Collection Time: 02/18/21 12:24 AM  Result Value Ref Range Status   Enterococcus faecalis NOT DETECTED NOT DETECTED Final   Enterococcus Faecium NOT DETECTED NOT DETECTED Final   Listeria monocytogenes NOT DETECTED NOT DETECTED Final   Staphylococcus species DETECTED (A) NOT DETECTED Final    Comment: CRITICAL RESULT CALLED TO, READ BACK BY AND VERIFIED WITH: L,CHEN PHARMD '@1840'$  02/18/21 EB    Staphylococcus aureus (BCID) NOT DETECTED NOT DETECTED Final   Staphylococcus epidermidis NOT DETECTED NOT DETECTED Final   Staphylococcus lugdunensis NOT DETECTED NOT DETECTED Final   Streptococcus species NOT DETECTED NOT DETECTED Final   Streptococcus agalactiae NOT DETECTED NOT DETECTED Final   Streptococcus pneumoniae NOT DETECTED NOT DETECTED Final   Streptococcus pyogenes NOT DETECTED NOT DETECTED Final   A.calcoaceticus-baumannii NOT DETECTED NOT DETECTED Final   Bacteroides fragilis NOT DETECTED NOT DETECTED Final   Enterobacterales NOT DETECTED NOT DETECTED Final   Enterobacter cloacae complex NOT DETECTED NOT DETECTED Final   Escherichia coli NOT DETECTED NOT DETECTED Final   Klebsiella aerogenes NOT DETECTED NOT DETECTED Final   Klebsiella oxytoca NOT DETECTED NOT DETECTED Final   Klebsiella pneumoniae NOT DETECTED NOT DETECTED Final   Proteus species NOT DETECTED NOT DETECTED Final   Salmonella species NOT DETECTED NOT DETECTED Final   Serratia marcescens NOT DETECTED NOT DETECTED Final   Haemophilus influenzae NOT DETECTED NOT DETECTED Final   Neisseria  meningitidis NOT DETECTED NOT DETECTED Final   Pseudomonas aeruginosa NOT DETECTED NOT DETECTED Final   Stenotrophomonas maltophilia NOT DETECTED NOT DETECTED Final   Candida albicans NOT DETECTED NOT DETECTED Final   Candida auris NOT DETECTED NOT DETECTED Final   Candida glabrata NOT DETECTED NOT DETECTED Final   Candida krusei NOT DETECTED NOT DETECTED Final   Candida parapsilosis NOT DETECTED NOT DETECTED Final   Candida tropicalis NOT DETECTED NOT DETECTED Final   Cryptococcus neoformans/gattii NOT DETECTED NOT DETECTED Final    Comment: Performed at [facility], [place] [address] [place], [place] [address]       Radiology Studies: DG Chest Port 1 View  Result Date: 02/20/2021 CLINICAL DATA:  Status post dialysis catheter placement EXAM: PORTABLE CHEST 1 VIEW COMPARISON:  02/17/2021 FINDINGS: Cardiac shadow is enlarged in size but stable. New left jugular dialysis catheter is noted in satisfactory position. No pneumothorax is noted. Lungs are well aerated bilaterally. Some improvement in the degree of vascular congestion is noted when compared with the prior exam. IMPRESSION: New dialysis catheter placement without evidence of pneumothorax. Improvement in vascular congestion when compare with the prior exam. Electronically Signed   By: Inez Catalina M.D.   On: 02/20/2021 19:33   DG Fluoro Guide CV Line-No Report  Result Date: 02/20/2021 Fluoroscopy was utilized by the requesting physician.  No radiographic interpretation.   VAS [place] UPPER EXT VEIN MAPPING (PRE-OP AVF)  Result Date: 02/20/2021 UPPER EXTREMITY VEIN MAPPING Patient Name:  JAKARION HANISH  Date of Exam:   02/20/2021 Medical Rec #: [id number]        Accession #:    [id number] Date of Birth: 01-Nov-1970       Patient Gender: M Patient Age:   88 years Exam Location:  [facility] Procedure:      VAS [place] UPPER EXT VEIN MAPPING (PRE-OP AVF) Referring Phys: [name]  --------------------------------------------------------------------------------  Indications: Pre-access. Comparison Study: No prior study Performing Technologist: Maudry Mayhew MHA, RDMS, RVT, RDCS  Examination Guidelines: A complete evaluation includes B-mode imaging, spectral Doppler, color Doppler, and power Doppler as needed of all accessible portions of each vessel. Bilateral testing is considered an integral part of a complete examination. Limited examinations for reoccurring indications may be performed as noted. +-----------------+-------------+----------+---------+ Right Cephalic   Diameter (cm)Depth (cm)Findings  +-----------------+-------------+----------+---------+ Shoulder             0.34                         +-----------------+-------------+----------+---------+ Prox upper arm       0.29                         +-----------------+-------------+----------+---------+ Mid upper arm        0.22                         +-----------------+-------------+----------+---------+ Dist upper arm       0.20                         +-----------------+-------------+----------+---------+ Antecubital fossa    0.23               branching +-----------------+-------------+----------+---------+ Prox forearm         0.23                         +-----------------+-------------+----------+---------+ Mid forearm          0.18                         +-----------------+-------------+----------+---------+ Dist forearm         0.19                         +-----------------+-------------+----------+---------+ Wrist                0.18                         +-----------------+-------------+----------+---------+ +-----------------+-------------+----------+--------------+ Right Basilic    Diameter (cm)Depth (cm)   Findings    +-----------------+-------------+----------+--------------+ Prox upper arm       0.28                               +-----------------+-------------+----------+--------------+ Mid upper arm        0.22                              +-----------------+-------------+----------+--------------+ Dist upper arm       0.25                              +-----------------+-------------+----------+--------------+  Antecubital fossa    0.18                 branching    +-----------------+-------------+----------+--------------+ Prox forearm         0.10                              +-----------------+-------------+----------+--------------+ Mid forearm          0.10                              +-----------------+-------------+----------+--------------+ Distal forearm       0.12                 branching    +-----------------+-------------+----------+--------------+ Wrist                                   not visualized +-----------------+-------------+----------+--------------+ +-----------------+-------------+----------+---------------------+ Left Cephalic    Diameter (cm)Depth (cm)      Findings        +-----------------+-------------+----------+---------------------+ Shoulder             0.33                                     +-----------------+-------------+----------+---------------------+ Prox upper arm       0.32                                     +-----------------+-------------+----------+---------------------+ Mid upper arm        0.34                                     +-----------------+-------------+----------+---------------------+ Dist upper arm       0.26                                     +-----------------+-------------+----------+---------------------+ Antecubital fossa    0.21                                     +-----------------+-------------+----------+---------------------+ Prox forearm         0.42                                     +-----------------+-------------+----------+---------------------+ Mid forearm          0.37                     thrombosed       +-----------------+-------------+----------+---------------------+ Dist forearm                            IV and not visualized +-----------------+-------------+----------+---------------------+ Left basilic vein not evaluated *See table(s) above for measurements and observations.  Diagnosing physician: [name] MD Electronically signed by [name] MD on 02/20/2021 at 5:34:50 PM.    Final     Scheduled Meds:  amLODipine  10 mg Oral Daily   aspirin EC  81 mg Oral Daily   atorvastatin  40 mg Oral Daily   calcitRIOL  0.25 mcg Oral Daily   carvedilol  12.5 mg Oral BID WC   Chlorhexidine Gluconate Cloth  6 each Topical Q0600   darbepoetin (ARANESP) injection - DIALYSIS  60 mcg Intravenous Q Tue-HD   heparin  5,000 Units Subcutaneous Q8H   hydrALAZINE  25 mg Oral TID   insulin aspart  0-9 Units Subcutaneous TID WC   insulin detemir  20 Units Subcutaneous Daily   nitroGLYCERIN  0.4 mg Transdermal Daily   sodium chloride flush  3 mL Intravenous Q12H   torsemide  100 mg Oral Daily   Continuous Infusions:  sodium chloride 10 mL/hr at 02/20/21 1159   sodium chloride     sodium chloride     sodium chloride       LOS: 4 days   Time spent: 27 minutes   [name], MD [facility]  02/22/2021, 12:27 PM  Please page via [facility] and do not message via secure chat for anything urgent. Secure chat can be used for anything non urgent.  How to contact the [facility] Attending or Consulting provider [place] or covering provider during after hours [place], for this patient?  Check the care team in [facility] and look for a) attending/consulting [facility] provider listed and b) the [facility] team listed. Page or secure chat [phone]. Log into [email or web address] and use [place]'s universal password to access. If you do not have the password, please contact the hospital operator. Locate the [facility] provider you are looking for under [facility] and page to a number that you can  be directly reached. If you still have difficulty reaching the provider, please page the [facility] (Director on Call) for the Hospitalists listed on amion for assistance.

## 2021-02-22 NOTE — Progress Notes (Addendum)
  Postoperative hemodialysis access  VASCULAR SURGERY ASSESSMENT & PLAN:   POD 2 TDC /RIGHT FOREARM AV GRAFT: His symptoms in the right hand have improved.  He has a palpable radial pulse in the right hand is warm.  I think his steal symptoms are resolving.  I have arranged a follow-up visit in 2 to 3 weeks to be sure that this is not an issue.  Vascular surgery will be available as needed.  Gae Gallop, MD 10:19 AM    Date of Surgery:  02/20/2021 Surgeon: Scot Dock  Subjective:  says he still has some numbness in his fingers and it is about the same.  Denies pain in his right hand.  PHYSICAL EXAMINATION:  Vitals:   02/22/21 0512 02/22/21 0721  BP: 125/67 140/74  Pulse: 81   Resp: 16 (!) 25  Temp: 98.1 F (36.7 C) 98.7 F (37.1 C)  SpO2: 95%     Incision is clean and dry  Sensation in digits is intact;  There is not  Thrill  The graft  is palpable  The radial pulse is palpable   ASSESSMENT/PLAN:  [name] is a 50 y.o. year old male who is s/p right FA loop graft.  -graft is patent -pt does have evidence of mild steal sx with mild numbness in his fingers on the right.  Will continue with the graft but if this worsens or becomes unbearable, then consideration of graft removal would need to be considered -AVG will be able to be used 03/23/2021  Leontine Locket, PA-C [facility] [phone]

## 2021-02-22 NOTE — Progress Notes (Signed)
Round on patient today in HD. Patient reports his treatment is going well, but patient with concerns that he has known people who were on dialysis that died. This RN stressed the importance of adhering to his dialysis schedule, medications, and following closely with his nephrologist. This RN also stressed the importance of him having continued conversations with his nephrologist related to his available modalities of doing dialysis. Patient also with questions regarding his fluid intake and management. Educated the patient on his current fluid restriction, the use of ice and the different increments in which fluids are measured. Also educated patient on available resources he will have on hand in the clinic setting to help him navigate his diet and social work. Patient with no further questions at this time. Will continue to follow as appropriate.  Dorthey Sawyer, RN  Dialysis Nurse Coordinator [phone]

## 2021-02-22 NOTE — Progress Notes (Signed)
Pt c/o of ringing in Left ear.  VSS.  CBG 116.  Will continue to monitor.

## 2021-02-23 DIAGNOSIS — N185 Chronic kidney disease, stage 5: Secondary | ICD-10-CM

## 2021-02-23 LAB — CBC
HCT: 25.6 % — ABNORMAL LOW (ref 39.0–52.0)
Hemoglobin: 7.9 g/dL — ABNORMAL LOW (ref 13.0–17.0)
MCH: 26.8 pg (ref 26.0–34.0)
MCHC: 30.9 g/dL (ref 30.0–36.0)
MCV: 86.8 fL (ref 80.0–100.0)
Platelets: 328 10*3/uL (ref 150–400)
RBC: 2.95 MIL/uL — ABNORMAL LOW (ref 4.22–5.81)
RDW: 13.8 % (ref 11.5–15.5)
WBC: 9.1 10*3/uL (ref 4.0–10.5)
nRBC: 0 % (ref 0.0–0.2)

## 2021-02-23 LAB — TYPE AND SCREEN
ABO/RH(D): O POS
Antibody Screen: NEGATIVE

## 2021-02-23 LAB — CULTURE, BLOOD (ROUTINE X 2)
Culture: NO GROWTH
Special Requests: ADEQUATE

## 2021-02-23 LAB — GLUCOSE, CAPILLARY
Glucose-Capillary: 98 mg/dL (ref 70–99)
Glucose-Capillary: 138 mg/dL — ABNORMAL HIGH (ref 70–99)
Glucose-Capillary: 127 mg/dL — ABNORMAL HIGH (ref 70–99)
Glucose-Capillary: 152 mg/dL — ABNORMAL HIGH (ref 70–99)

## 2021-02-23 LAB — IRON AND TIBC
Iron: 28 ug/dL — ABNORMAL LOW (ref 45–182)
Saturation Ratios: 12 % — ABNORMAL LOW (ref 17.9–39.5)
TIBC: 227 ug/dL — ABNORMAL LOW (ref 250–450)
UIBC: 199 ug/dL

## 2021-02-23 LAB — RETICULOCYTES
Immature Retic Fract: 21.5 % — ABNORMAL HIGH (ref 2.3–15.9)
RBC.: 2.94 MIL/uL — ABNORMAL LOW (ref 4.22–5.81)
Retic Count, Absolute: 65.9 10*3/uL (ref 19.0–186.0)
Retic Ct Pct: 2.2 % (ref 0.4–3.1)

## 2021-02-23 LAB — HEPATITIS B SURFACE ANTIBODY, QUANTITATIVE: Hep B S AB Quant (Post): <3.1 m[IU]/mL — ABNORMAL LOW (ref >9.9)

## 2021-02-23 LAB — FOLATE: Folate: 10.9 ng/mL (ref >5.9)

## 2021-02-23 LAB — VITAMIN B12: Vitamin B-12: 481 pg/mL (ref 180–914)

## 2021-02-23 LAB — HEPATITIS B DNA, ULTRAQUANTITATIVE, PCR
HBV DNA SERPL PCR-ACNC: NOT DETECTED IU/mL
HBV DNA SERPL PCR-LOG IU: UNDETERMINED log10 IU/mL

## 2021-02-23 LAB — FERRITIN: Ferritin: 526 ng/mL — ABNORMAL HIGH (ref 24–336)

## 2021-02-23 LAB — ABO/RH: ABO/RH(D): O POS

## 2021-02-23 MED ORDER — FUROSEMIDE 10 MG/ML IJ SOLN
160.0000 mg | Freq: Four times a day (QID) | INTRAVENOUS | Status: AC
  Administered 2021-02-23 (×2): 160 mg via INTRAVENOUS
  Filled 2021-02-23: qty 10
  Filled 2021-02-23: qty 16

## 2021-02-23 MED ORDER — DARBEPOETIN ALFA 60 MCG/0.3ML IJ SOSY
60.0000 ug | PREFILLED_SYRINGE | INTRAMUSCULAR | Status: DC
  Administered 2021-02-24: 60 ug via INTRAVENOUS
  Filled 2021-02-23: qty 0.3

## 2021-02-23 NOTE — Progress Notes (Addendum)
Met with pt at bedside this am. Pt was able to speak with the financial representative with [facility] via phone this am. Pt answered questions for assessment. Will await clinic and chair time assignment. Information should be available today.   Melven Sartorius Renal Navigator [phone]  Addendum at 3:27 pm: Pt has been cleared financially. Contacted [place] regarding schedule and awaiting a response.    Addendum at 4:23 pm: Pt has been accepted at [facility]. Pt has been assigned a TTS schedule. Pt will need to arrive at 11:30 with a 11:50 chair time. Pt will need to be at the clinic on Monday, October 10 at 3:00 to complete paperwork. Pt can start at clinic on 10/11. Met with pt at bedside to discuss this information in detail and also provided pt with an information sheet with information documented (clinic name, address, phone number, days/time, etc). Sheet placed in pt's belongings bag per pt's request. Pt agreeable to plan and no further questions at this time. Update provided to CSW and nephrologist. Will follow and assist.

## 2021-02-23 NOTE — Progress Notes (Signed)
PROGRESS NOTE    [name]  [id number] DOB: [birth year] DOA: 02/17/2021 PCP: Vevelyn Francois, NP   Brief Narrative:   [name] is a 50 y.o. male with medical history significant of HTN, DM, HLD, chronic systolic CHF, medical noncompliance presented to ED with chest pain that started a couple of weeks ago and has been intermittent in nature. Pain located over left lateral chest wall, under left breast, sharp 9 out of 10 with no radiation and no associated diaphoresis or exacerbating or relieving factors.  Had associated shortness of breath intermittently.  Also endorses orthopnea and uses 2 pillows to sleep.  Has lower extremity edema bilateral which started 3 to 4 months ago.  No fever, chills or any other complaint.  Patient is a poor historian.  Upon arrival to ED, he was afebrile with blood pressure of 202/115.  Otherwise saturating 100% on room air.  Troponin slightly elevated but flat. Chest x-ray shows stable cardiomegaly and chronic diffuse pulmonary vascular congestion.  No evidence of frank pulmonary edema. In ED given 60 mg IV Lasix, 100 mg hydralazine tablet, and 20 mg labetalol injection.  TRH was called and asked to admit.  Due to advanced and progressive CKD, nephrology was consulted and they recommended initiating dialysis while inpatient.  Vascular surgery did TDC and left IJ and placement of new right forearm AV graft on 02/20/2021.  Patient started on dialysis on 02/21/2021.  Assessment & Plan:   Active Problems:   Hyperlipidemia LDL goal <70   Stage 4 chronic kidney disease ([place])   Hypertensive crisis   Acute combined systolic and diastolic congestive heart failure (HCC)   Type 2 diabetes mellitus ([place])   Hypertensive urgency   CKD stage V, with uremic symptoms, -Progressed to ESRD -Management per renal, started on hemodialysis, awaiting clip - S/p TDC and AVG on 02/20/2021 by Dr. Scot Dock  Chest pain:  - Currently chest pain-free.  No acute ST-T wave  changes on EKG.  Troponin elevated but flat and in fact not too far away from his baseline creatinine since it has been elevated since last 2 to 3 years likely due to demand ischemia in combination of advanced CKD.  Dyspnea/acute on chronic combined diastolic and systolic congestive heart failure - Echo done in July this year shows 30% ejection fraction with grade 1 diastolic dysfunction and left ventricular global hypokinesis.  No need to repeat echo.  - Volume management with HD.  Fever/leukocytosis:  -He developed fever of 100.4 and also developed leukocytosis of 17,000 on 02/18/2021.  Only one of the 4 blood culture sets is growing staph aureus/hominis, no MRSA.  Patient has remained afebrile since then.  Leukocytosis also improving.  UA negative and chest x-ray negative for infection.  No other indication of infection anywhere else.  Likely contaminant.  Continue to monitor off antibiotics for now.  Hypertensive urgency: Presented with blood pressure of 202/115.  Currently he is only on amlodipine and blood pressure is labile but much better.  Hoping that with continued dialysis, his blood pressure will improve.  Continue current medications and monitor closely and adjust medications as needed.  Poorly controlled type 2 diabetes mellitus with hyperglycemia: Hemoglobin A1c 8.8.  He is supposed to be taking Lantus 35 units at home.  Currently on 20 units of Lantus and SSI and blood sugar controlled.  Continue this.   Hyperlipidemia: Continue statin.   Anemia of chronic disease: -will Check anemia panel, denies any GI bleed, will send type  and screen as well.  Chronic visual deficit: Its been going on since 6 months according to H&P.  Patient did not mention anything to me about his vision.  Likely diabetic retinopathy in the face of being noncompliant.  Will need ophthalmology as outpatient.  Long history of noncompliance: Per nephrologist, patient has missed his several appointments with  nephrology.  Counseling provided.  DVT prophylaxis: heparin injection 5,000 Units Start: 02/17/21 2200   Code Status: Full Code  Family Communication:  None present at bedside.  Plan of care discussed with patient in length and he verbalized understanding and agreed with it.  Status is: Observation  The patient will require care spanning > 2 midnights and should be moved to inpatient because: Inpatient level of care appropriate due to severity of illness  Dispo: The patient is from: Home              Anticipated d/c is to: Home              Patient currently is not medically stable to d/c.  CLIPP pending.   Difficult to place patient No  Estimated body mass index is 26.76 kg/m as calculated from the following:   Height as of this encounter: '5\' 7"'$  (1.702 m).   Weight as of this encounter: 77.5 kg.   Nutritional Assessment: Body mass index is 26.76 kg/m.Marland Kitchen Seen by dietician.  I agree with the assessment and plan as outlined below: Nutrition Status:   Skin Assessment: I have examined the patient's skin and I agree with the wound assessment as performed by the wound care RN as outlined below:    Consultants:  Nephrology  Procedures:  None  Antimicrobials:  Anti-infectives (From admission, onward)    Start     Dose/Rate Route Frequency Ordered Stop   02/20/21 1345  vancomycin (VANCOCIN) IVPB 1000 mg/200 mL premix  Status:  Discontinued        1,000 mg 200 mL/hr over 60 Minutes Intravenous To Surgery 02/20/21 1335 02/20/21 1742          Subjective:  Patient denies any complaints today, reports normal color/light brown yesterday, he denies any melena or bright red blood per rectum.  Objective: Vitals:   02/23/21 0543 02/23/21 0544 02/23/21 0700 02/23/21 0900  BP:  133/69 (!) 157/74 (!) 144/72  Pulse:  81  86  Resp:  12  17  Temp: 99.1 F (37.3 C)     TempSrc: Oral     SpO2:  92%    Weight:      Height:        Intake/Output Summary (Last 24 hours) at  02/23/2021 1129 Last data filed at 02/22/2021 1913 Gross per 24 hour  Intake --  Output 800 ml  Net -800 ml   Filed Weights   02/21/21 1130 02/22/21 0512 02/22/21 0948  Weight: 78.3 kg 77.6 kg 77.5 kg    Examination:  awake Alert, Oriented X 3, No new F.N deficits, Normal affect Symmetrical Chest wall movement, Good air movement bilaterally, CTAB RRR,No Gallops,Rubs or new Murmurs, No Parasternal Heave +ve B.Sounds, Abd Soft, No tenderness, No rebound - guarding or rigidity. No Cyanosis, Clubbing or edema, No new Rash or bruise    Data Reviewed: I have personally reviewed following labs and imaging studies  CBC: Recent Labs  Lab 02/18/21 0024 02/19/21 0018 02/21/21 0114 02/22/21 0109 02/22/21 1018  WBC 17.1* 11.0* 8.5 7.9 8.3  NEUTROABS  --  8.3* 6.4 5.5 6.0  HGB 10.4* 8.2*  8.2* 7.9* 7.2*  HCT 32.6* 27.0* 26.1* 25.1* 23.6*  MCV 85.1 87.9 84.7 83.9 86.1  PLT 347 276 328 308 [id number]   Basic Metabolic Panel: Recent Labs  Lab 02/17/21 1527 02/17/21 2221 02/18/21 0024 02/19/21 0018 02/20/21 0946 02/21/21 0114 02/22/21 1018  NA 137  --  139 133* 136  --  134*  K 4.4  --  3.6 4.1 3.5  --  3.8  CL 108  --  109 107 105  --  100  CO2 20*  --  18* 17* 21*  --  25  GLUCOSE 187*  --  192* 102* 91  --  127*  BUN 49*  --  51* 59* 60*  --  43*  CREATININE 5.22*  --  5.27* 6.38* 6.92*  --  6.29*  CALCIUM 7.0*  --  6.9* 6.2* 7.1* 6.9* 7.1*  MG  --  1.5*  --   --   --   --   --   PHOS  --  4.9*  --   --   --   --  4.8*   GFR: Estimated Creatinine Clearance: 13.6 mL/min (A) (by C-G formula based on SCr of 6.29 mg/dL (H)). Liver Function Tests: Recent Labs  Lab 02/17/21 1527 02/22/21 1018  AST 20  --   ALT 13  --   ALKPHOS 105  --   BILITOT 0.4  --   PROT 5.5*  --   ALBUMIN 1.9* 1.5*   No results for input(s): LIPASE, AMYLASE in the last 168 hours. No results for input(s): AMMONIA in the last 168 hours. Coagulation Profile: No results for input(s): INR, PROTIME in the  last 168 hours. Cardiac Enzymes: No results for input(s): CKTOTAL, CKMB, CKMBINDEX, TROPONINI in the last 168 hours. BNP (last 3 results) No results for input(s): PROBNP in the last 8760 hours. HbA1C: No results for input(s): HGBA1C in the last 72 hours.  CBG: Recent Labs  Lab 02/22/21 0901 02/22/21 1322 02/22/21 1623 02/22/21 2111 02/23/21 0833  GLUCAP 132* 101* 164* 113* 98   Lipid Profile: No results for input(s): CHOL, HDL, LDLCALC, TRIG, CHOLHDL, LDLDIRECT in the last 72 hours.  Thyroid Function Tests: No results for input(s): TSH, T4TOTAL, FREET4, T3FREE, THYROIDAB in the last 72 hours. Anemia Panel: No results for input(s): VITAMINB12, FOLATE, FERRITIN, TIBC, IRON, RETICCTPCT in the last 72 hours.  Sepsis Labs: No results for input(s): PROCALCITON, LATICACIDVEN in the last 168 hours.  Recent Results (from the past 240 hour(s))  Resp Panel by RT-PCR (Flu A&B, Covid) Nasopharyngeal Swab     Status: None   Collection Time: 02/17/21  4:45 PM   Specimen: Nasopharyngeal Swab; Nasopharyngeal(NP) swabs in vial transport medium  Result Value Ref Range Status   SARS Coronavirus 2 by RT PCR NEGATIVE NEGATIVE Final    Comment: (NOTE) SARS-CoV-2 target nucleic acids are NOT DETECTED.  The SARS-CoV-2 RNA is generally detectable in upper respiratory specimens during the acute phase of infection. The lowest concentration of SARS-CoV-2 viral copies this assay can detect is 138 copies/mL. A negative result does not preclude SARS-Cov-2 infection and should not be used as the sole basis for treatment or other patient management decisions. A negative result may occur with  improper specimen collection/handling, submission of specimen other than nasopharyngeal swab, presence of viral mutation(s) within the areas targeted by this assay, and inadequate number of viral copies(<138 copies/mL). A negative result must be combined with clinical observations, patient history, and  epidemiological information. The expected  result is Negative.  Fact Sheet for Patients:  EntrepreneurPulse.com.au  Fact Sheet for Healthcare Providers:  IncredibleEmployment.be  This test is no t yet approved or cleared by the [place] FDA and  has been authorized for detection and/or diagnosis of SARS-CoV-2 by FDA under an Emergency Use Authorization (EUA). This EUA will remain  in effect (meaning this test can be used) for the duration of the COVID-19 declaration under Section 564(b)(1) of the Act, 21 U.S.C.section 360bbb-3(b)(1), unless the authorization is terminated  or revoked sooner.       Influenza A by PCR NEGATIVE NEGATIVE Final   Influenza B by PCR NEGATIVE NEGATIVE Final    Comment: (NOTE) The Xpert Xpress SARS-CoV-2/FLU/RSV plus assay is intended as an aid in the diagnosis of influenza from Nasopharyngeal swab specimens and should not be used as a sole basis for treatment. Nasal washings and aspirates are unacceptable for Xpert Xpress SARS-CoV-2/FLU/RSV testing.  Fact Sheet for Patients: EntrepreneurPulse.com.au  Fact Sheet for Healthcare Providers: IncredibleEmployment.be  This test is not yet approved or cleared by the [place] FDA and has been authorized for detection and/or diagnosis of SARS-CoV-2 by FDA under an Emergency Use Authorization (EUA). This EUA will remain in effect (meaning this test can be used) for the duration of the COVID-19 declaration under Section 564(b)(1) of the Act, 21 U.S.C. section 360bbb-3(b)(1), unless the authorization is terminated or revoked.  Performed at [facility], [place] [address], [place], [place] [address]   MRSA Next Gen by PCR, Nasal     Status: None   Collection Time: 02/17/21 10:09 PM   Specimen: Nasal Mucosa; Nasal Swab  Result Value Ref Range Status   MRSA by PCR Next Gen NOT DETECTED NOT DETECTED Final    Comment:  (NOTE) The GeneXpert MRSA Assay (FDA approved for NASAL specimens only), is one component of a comprehensive MRSA colonization surveillance program. It is not intended to diagnose MRSA infection nor to guide or monitor treatment for MRSA infections. Test performance is not FDA approved in patients less than 66 years old. Performed at [facility], [place] [address], [place], [place] [address]   Culture, blood (routine x 2)     Status: None (Preliminary result)   Collection Time: 02/18/21 12:24 AM   Specimen: BLOOD  Result Value Ref Range Status   Specimen Description BLOOD RIGHT ANTECUBITAL  Final   Special Requests   Final    BOTTLES DRAWN AEROBIC AND ANAEROBIC Blood Culture adequate volume   Culture   Final    NO GROWTH 4 DAYS Performed at [facility], [place] [address] [place], [place] [address]    Report Status PENDING  Incomplete  Culture, blood (routine x 2)     Status: Abnormal   Collection Time: 02/18/21 12:24 AM   Specimen: BLOOD RIGHT HAND  Result Value Ref Range Status   Specimen Description BLOOD RIGHT HAND  Final   Special Requests   Final    BOTTLES DRAWN AEROBIC AND ANAEROBIC Blood Culture adequate volume   Culture  Setup Time   Final    GRAM POSITIVE COCCI AEROBIC BOTTLE ONLY CRITICAL RESULT CALLED TO, READ BACK BY AND VERIFIED WITH: L,CHEN PHARMD '@1840'$  02/18/21 EB    Culture (A)  Final    STAPHYLOCOCCUS HOMINIS THE SIGNIFICANCE OF ISOLATING THIS ORGANISM FROM A SINGLE SET OF BLOOD CULTURES WHEN MULTIPLE SETS ARE DRAWN IS UNCERTAIN. PLEASE NOTIFY THE MICROBIOLOGY DEPARTMENT WITHIN ONE WEEK IF SPECIATION AND SENSITIVITIES ARE  REQUIRED. Performed at [facility], [place] [address], [place], [place] [address]    Report Status 02/19/2021 FINAL  Final  Blood Culture ID Panel (Reflexed)     Status: Abnormal   Collection Time: 02/18/21 12:24 AM  Result Value Ref Range Status   Enterococcus faecalis NOT DETECTED NOT DETECTED Final   Enterococcus Faecium  NOT DETECTED NOT DETECTED Final   Listeria monocytogenes NOT DETECTED NOT DETECTED Final   Staphylococcus species DETECTED (A) NOT DETECTED Final    Comment: CRITICAL RESULT CALLED TO, READ BACK BY AND VERIFIED WITH: L,CHEN PHARMD '@1840'$  02/18/21 EB    Staphylococcus aureus (BCID) NOT DETECTED NOT DETECTED Final   Staphylococcus epidermidis NOT DETECTED NOT DETECTED Final   Staphylococcus lugdunensis NOT DETECTED NOT DETECTED Final   Streptococcus species NOT DETECTED NOT DETECTED Final   Streptococcus agalactiae NOT DETECTED NOT DETECTED Final   Streptococcus pneumoniae NOT DETECTED NOT DETECTED Final   Streptococcus pyogenes NOT DETECTED NOT DETECTED Final   A.calcoaceticus-baumannii NOT DETECTED NOT DETECTED Final   Bacteroides fragilis NOT DETECTED NOT DETECTED Final   Enterobacterales NOT DETECTED NOT DETECTED Final   Enterobacter cloacae complex NOT DETECTED NOT DETECTED Final   Escherichia coli NOT DETECTED NOT DETECTED Final   Klebsiella aerogenes NOT DETECTED NOT DETECTED Final   Klebsiella oxytoca NOT DETECTED NOT DETECTED Final   Klebsiella pneumoniae NOT DETECTED NOT DETECTED Final   Proteus species NOT DETECTED NOT DETECTED Final   Salmonella species NOT DETECTED NOT DETECTED Final   Serratia marcescens NOT DETECTED NOT DETECTED Final   Haemophilus influenzae NOT DETECTED NOT DETECTED Final   Neisseria meningitidis NOT DETECTED NOT DETECTED Final   Pseudomonas aeruginosa NOT DETECTED NOT DETECTED Final   Stenotrophomonas maltophilia NOT DETECTED NOT DETECTED Final   Candida albicans NOT DETECTED NOT DETECTED Final   Candida auris NOT DETECTED NOT DETECTED Final   Candida glabrata NOT DETECTED NOT DETECTED Final   Candida krusei NOT DETECTED NOT DETECTED Final   Candida parapsilosis NOT DETECTED NOT DETECTED Final   Candida tropicalis NOT DETECTED NOT DETECTED Final   Cryptococcus neoformans/gattii NOT DETECTED NOT DETECTED Final    Comment: Performed at [facility], [place] [address], [place], [place] [address]       Radiology Studies: No results found.  Scheduled Meds:  amLODipine  10 mg Oral Daily   aspirin EC  81 mg Oral Daily   atorvastatin  40 mg Oral Daily   calcitRIOL  0.25 mcg Oral Daily   carvedilol  12.5 mg Oral BID WC   Chlorhexidine Gluconate Cloth  6 each Topical Q0600   darbepoetin (ARANESP) injection - DIALYSIS  60 mcg Intravenous Q Tue-HD   heparin  5,000 Units Subcutaneous Q8H   hydrALAZINE  25 mg Oral TID   insulin aspart  0-9 Units Subcutaneous TID WC   insulin detemir  20 Units Subcutaneous Daily   nitroGLYCERIN  0.4 mg Transdermal Daily   sodium chloride flush  3 mL Intravenous Q12H   Continuous Infusions:  sodium chloride 10 mL/hr at 02/20/21 1159   sodium chloride     sodium chloride     sodium chloride     furosemide 160 mg (02/23/21 1054)     LOS: 5 days      Phillips Climes, MD [facility]  02/23/2021, 11:29 AM  Please page via [facility] and do not message via secure chat for anything urgent. Secure chat can be used for anything non urgent.  How to  contact the [facility] Attending or Consulting provider [place] or covering provider during after hours [place], for this patient?  Check the care team in [facility] and look for a) attending/consulting TR[facility] provider listed and b) the [facility] team listed. Page or secure chat [phone]. Log into [email or web address] and use [place]'s universal password to access. If you do not have the password, please contact the hospital operator. Locate the [facility] provider you are looking for under [facility] and page to a number that you can be directly reached. If you still have difficulty reaching the provider, please page the [facility] (Director on Call) for the Hospitalists listed on amion for assistance.

## 2021-02-23 NOTE — Progress Notes (Addendum)
[facility] Progress Note   Subjective:   Overall feeing ok.  361m  UOP yesterday.  Had 2nd yesterday.  Mild symptoms of steal R hand eval by [facility] yesterday - no intervention needed Awaiting disposition.  Objective Vitals:   02/23/21 0542 02/23/21 0543 02/23/21 0544 02/23/21 0700  BP:   133/69 (!) 157/74  Pulse: 81  81   Resp: 14  12   Temp:  99.1 F (37.3 C)    TempSrc:  Oral    SpO2: 92%  92%   Weight:      Height:       Physical Exam General: nontoxic lying in bed Heart: RRR Lungs: clear, normal WOB Abdomen: soft, nontender Extremities: 1-2+ LE edema now, more Dialysis Access: RIJ TDC, RUE AVG +t/b, 2+ radial pulse and hand warm but c/o some pain stable  Additional Objective Labs: Basic Metabolic Panel: Recent Labs  Lab 02/17/21 2221 02/18/21 0024 02/19/21 0018 02/20/21 0946 02/21/21 0114 02/22/21 1018  NA  --    < > 133* 136  --  134*  K  --    < > 4.1 3.5  --  3.8  CL  --    < > 107 105  --  100  CO2  --    < > 17* 21*  --  25  GLUCOSE  --    < > 102* 91  --  127*  BUN  --    < > 59* 60*  --  43*  CREATININE  --    < > 6.38* 6.92*  --  6.29*  CALCIUM  --    < > 6.2* 7.1* 6.9* 7.1*  PHOS 4.9*  --   --   --   --  4.8*   < > = values in this interval not displayed.    Liver Function Tests: Recent Labs  Lab 02/17/21 1527 02/22/21 1018  AST 20  --   ALT 13  --   ALKPHOS 105  --   BILITOT 0.4  --   PROT 5.5*  --   ALBUMIN 1.9* 1.5*    No results for input(s): LIPASE, AMYLASE in the last 168 hours. CBC: Recent Labs  Lab 02/18/21 0024 02/18/21 0024 02/19/21 0018 02/21/21 0114 02/22/21 0109 02/22/21 1018  WBC 17.1*  --  11.0* 8.5 7.9 8.3  NEUTROABS  --    < > 8.3* 6.4 5.5 6.0  HGB 10.4*  --  8.2* 8.2* 7.9* 7.2*  HCT 32.6*  --  27.0* 26.1* 25.1* 23.6*  MCV 85.1  --  87.9 84.7 83.9 86.1  PLT 347  --  276 328 308 300   < > = values in this interval not displayed.    Blood Culture    Component Value Date/Time   SDES BLOOD  RIGHT ANTECUBITAL 02/18/2021 0024   SDES BLOOD RIGHT HAND 02/18/2021 0024   SPECREQUEST  02/18/2021 0024    BOTTLES DRAWN AEROBIC AND ANAEROBIC Blood Culture adequate volume   SPECREQUEST  02/18/2021 0024    BOTTLES DRAWN AEROBIC AND ANAEROBIC Blood Culture adequate volume   CULT  02/18/2021 0024    NO GROWTH 4 DAYS Performed at [facility] [id number] [place] [id number]   CULT (A) 02/18/2021 0024    STAPHYLOCOCCUS HOMINIS THE SIGNIFICANCE OF ISOLATING THIS ORGANISM FROM A SINGLE SET OF BLOOD CULTURES WHEN MULTIPLE SETS ARE DRAWN IS UNCERTAIN. PLEASE NOTIFY THE MICROBIOLOGY DEPARTMENT WITHIN ONE WEEK IF SPECIATION AND  SENSITIVITIES ARE REQUIRED. Performed at [facility], [place] [address], [place], [place] [address]    REPTSTATUS PENDING 02/18/2021 0024   REPTSTATUS 02/19/2021 FINAL 02/18/2021 0024    Cardiac Enzymes: No results for input(s): CKTOTAL, CKMB, CKMBINDEX, TROPONINI in the last 168 hours. CBG: Recent Labs  Lab 02/22/21 0901 02/22/21 1322 02/22/21 1623 02/22/21 2111 02/23/21 0833  GLUCAP 132* 101* 164* 113* 98    Iron Studies:  No results for input(s): IRON, TIBC, TRANSFERRIN, FERRITIN in the last 72 hours.  '@lablastinr3'$ @ Studies/Results: No results found. Medications:  sodium chloride 10 mL/hr at 02/20/21 1159   sodium chloride     sodium chloride     sodium chloride      amLODipine  10 mg Oral Daily   aspirin EC  81 mg Oral Daily   atorvastatin  40 mg Oral Daily   calcitRIOL  0.25 mcg Oral Daily   carvedilol  12.5 mg Oral BID WC   Chlorhexidine Gluconate Cloth  6 each Topical Q0600   darbepoetin (ARANESP) injection - DIALYSIS  60 mcg Intravenous Q Tue-HD   heparin  5,000 Units Subcutaneous Q8H   hydrALAZINE  25 mg Oral TID   insulin aspart  0-9 Units Subcutaneous TID WC   insulin detemir  20 Units Subcutaneous Daily   nitroGLYCERIN  0.4 mg Transdermal Daily   sodium chloride flush  3 mL Intravenous Q12H   torsemide  100 mg  Oral Daily     Assessment/Recommendations: [name] is a/an 50 y.o. male with a past medical history HTN, DM2, HLD, HFrEF (EF 30 to 35% in July 2022), CKD who present w/ chest pain and progressive CKD   CKD V now with uremic symptoms > progressed to ESRD:   Having low grade uremic symptoms now and on history of poor adherence/follow up he's agreeable to starting dialysis while in hospital.   -s/p [facility] and AVG 10/4, Dr. Scot Dock -HD #1 10/5, plan #2 10/6  and #3 tomorrow if remains inpatient -CLIP to outpt unit, navigator aware -- ok for d/c as soon as has outpt HD arranged. -Dose meds for ESRD -Monitor Daily I/Os, Daily weight  -Maintain MAP>65 for optimal renal perfusion.  -Avoid nephrotoxic medications including NSAIDs and Vanc/Zosyn combo  Secondary Hyperparathyroidism:  PTH 78, phos ok, start calcitriol for hypoca, use 2.5ca dialysate for now - corr improving   HFrEF: History of heart failure with EF of 30 to 35% in July 2022.  -Hypertension management as below -d/c torsemide as not much UOP -- has new edema; will give 2 doses IV lasix today   Hypertension: Currently on amlodipine and coreg with reasonable BPs.    Anemia due to CKD: Hemoglobin 10.4 > 8.2>7.9>7.2.  No gross bleeding noted.  Iron sat 8 and ferritin 68. IV iron '250mg'$  daily x4 doses. ESA with HD. Continue to monitor and transfuse < 7.    Uncontrolled Diabetes Mellitus Type 2 with Hyperglycemia: Management per primary   Metabolic acidosis: now resolved with HD   [name] MD 02/23/2021, 8:40 AM  [facility] Pager: [phone]

## 2021-02-23 NOTE — TOC Progression Note (Signed)
Transition of Care [facility]) - Progression Note    Patient Details  Name: [name] MRN: [id number] Date of Birth: December 12, 1970  [facility] CM/SW Contact  Joanne Chars, LCSW Phone Number: 02/23/2021, 1:10 PM  Clinical Narrative:  CSW spoke with pt regarding transportation for HD.  Pt reports his brother is able to take him and this is his plan.  He was given application for transportation if he needs to change this in the future.       Expected Discharge Plan: Home/Self Care Barriers to Discharge: Continued Medical Work up  Expected Discharge Plan and Services Expected Discharge Plan: Home/Self Care   Discharge Planning Services: CM Consult Post Acute Care Choice: NA Living arrangements for the past 2 months: Single Family Home                   DME Agency: NA       HH Arranged: NA           Social Determinants of Health (SDOH) Interventions    Readmission Risk Interventions Readmission Risk Prevention Plan 02/19/2021  Transportation Screening Complete  Medication Review [employer] photographer) Complete  PCP or Specialist appointment within 3-5 days of discharge Complete  HRI or [place] Complete  SW Recovery Care/Counseling Consult Complete  [place] Not Applicable  Some recent data might be hidden

## 2021-02-23 NOTE — Progress Notes (Signed)
Pt c/o numbness and pain in his right hand, which is not new. He was seen by vascular surgery for this reason.  Extremity warm and dry to touch, radial and ulnar pulses +2, color appropriate, pt able to move extremity, and sensation intact.  Pain med given and pt advised to keep extremity elevated to reduce swelling.  On reassessment pt asleep.

## 2021-02-24 ENCOUNTER — Other Ambulatory Visit (HOSPITAL_COMMUNITY): Payer: Self-pay

## 2021-02-24 DIAGNOSIS — Z794 Long term (current) use of insulin: Secondary | ICD-10-CM

## 2021-02-24 DIAGNOSIS — E1129 Type 2 diabetes mellitus with other diabetic kidney complication: Secondary | ICD-10-CM

## 2021-02-24 DIAGNOSIS — I5043 Acute on chronic combined systolic (congestive) and diastolic (congestive) heart failure: Secondary | ICD-10-CM

## 2021-02-24 DIAGNOSIS — I160 Hypertensive urgency: Secondary | ICD-10-CM

## 2021-02-24 LAB — RENAL FUNCTION PANEL
Albumin: 1.5 g/dL — ABNORMAL LOW (ref 3.5–5.0)
Anion gap: 7 (ref 5–15)
BUN: 36 mg/dL — ABNORMAL HIGH (ref 6–20)
CO2: 27 mmol/L (ref 22–32)
Calcium: 7.7 mg/dL — ABNORMAL LOW (ref 8.9–10.3)
Chloride: 101 mmol/L (ref 98–111)
Creatinine, Ser: 6.54 mg/dL — ABNORMAL HIGH (ref 0.61–1.24)
GFR, Estimated: 10 mL/min — ABNORMAL LOW (ref >60)
Glucose, Bld: 141 mg/dL — ABNORMAL HIGH (ref 70–99)
Phosphorus: 5 mg/dL — ABNORMAL HIGH (ref 2.5–4.6)
Potassium: 3.9 mmol/L (ref 3.5–5.1)
Sodium: 135 mmol/L (ref 135–145)

## 2021-02-24 LAB — GLUCOSE, CAPILLARY
Glucose-Capillary: 134 mg/dL — ABNORMAL HIGH (ref 70–99)
Glucose-Capillary: 103 mg/dL — ABNORMAL HIGH (ref 70–99)
Glucose-Capillary: 142 mg/dL — ABNORMAL HIGH (ref 70–99)
Glucose-Capillary: 152 mg/dL — ABNORMAL HIGH (ref 70–99)
Glucose-Capillary: 230 mg/dL — ABNORMAL HIGH (ref 70–99)

## 2021-02-24 LAB — CBC
HCT: 23.2 % — ABNORMAL LOW (ref 39.0–52.0)
Hemoglobin: 7.2 g/dL — ABNORMAL LOW (ref 13.0–17.0)
MCH: 27 pg (ref 26.0–34.0)
MCHC: 31 g/dL (ref 30.0–36.0)
MCV: 86.9 fL (ref 80.0–100.0)
Platelets: 319 10*3/uL (ref 150–400)
RBC: 2.67 MIL/uL — ABNORMAL LOW (ref 4.22–5.81)
RDW: 13.6 % (ref 11.5–15.5)
WBC: 12 10*3/uL — ABNORMAL HIGH (ref 4.0–10.5)
nRBC: 0 % (ref 0.0–0.2)

## 2021-02-24 MED ORDER — HEPARIN SODIUM (PORCINE) 1000 UNIT/ML IJ SOLN
INTRAMUSCULAR | Status: AC
  Administered 2021-02-24: 3800 [IU]
  Filled 2021-02-24: qty 4

## 2021-02-24 MED ORDER — HEPARIN SODIUM (PORCINE) 1000 UNIT/ML IJ SOLN: 3800.0000 [IU] | Freq: Once | INTRAMUSCULAR | Status: AC

## 2021-02-24 MED ORDER — NOVOLOG FLEXPEN 100 UNIT/ML ~~LOC~~ SOPN
5.0000 [IU] | PEN_INJECTOR | Freq: Two times a day (BID) | SUBCUTANEOUS | 2 refills | Status: DC
Start: 2021-02-24 — End: 2021-02-26
  Filled 2021-02-24: qty 3, 30d supply, fill #0

## 2021-02-24 MED ORDER — HYDRALAZINE HCL 25 MG PO TABS
25.0000 mg | ORAL_TABLET | Freq: Three times a day (TID) | ORAL | 0 refills | Status: DC
  Filled 2021-02-24: qty 90, 30d supply, fill #0

## 2021-02-24 MED ORDER — INSULIN GLARGINE 100 UNIT/ML SOLOSTAR PEN
25.0000 [IU] | PEN_INJECTOR | Freq: Every day | SUBCUTANEOUS | 0 refills | Status: DC
  Filled 2021-02-24 (×2): qty 9, 34d supply, fill #0
  Filled 2021-02-26: qty 6, 24d supply, fill #0

## 2021-02-24 MED ORDER — CARVEDILOL 12.5 MG PO TABS
12.5000 mg | ORAL_TABLET | Freq: Two times a day (BID) | ORAL | 0 refills | Status: DC
  Filled 2021-02-24: qty 60, 30d supply, fill #0

## 2021-02-24 MED ORDER — AMLODIPINE BESYLATE 10 MG PO TABS
10.0000 mg | ORAL_TABLET | Freq: Every day | ORAL | 0 refills | Status: DC
  Filled 2021-02-24: qty 30, 30d supply, fill #0

## 2021-02-24 MED ORDER — CALCITRIOL 0.25 MCG PO CAPS
0.2500 ug | ORAL_CAPSULE | Freq: Every day | ORAL | 0 refills | Status: DC
  Filled 2021-02-24: qty 30, 30d supply, fill #0

## 2021-02-24 MED ORDER — ATORVASTATIN CALCIUM 40 MG PO TABS
40.0000 mg | ORAL_TABLET | Freq: Every day | ORAL | 0 refills | Status: DC
  Filled 2021-02-24: qty 30, 30d supply, fill #0

## 2021-02-24 NOTE — Progress Notes (Signed)
Returned from dialysis

## 2021-02-24 NOTE — Progress Notes (Deleted)
The patient oxygen saturation is 83- 88% on room air- at rest. He requires 2L of O2 to maintain saturation above 90% at rest. Currently, 100% on 2L [place].

## 2021-02-24 NOTE — Discharge Summary (Signed)
Physician Discharge Summary  [name] QMG:[id number] DOB: 1970/06/10 DOA: 02/17/2021  PCP: Vevelyn Francois, NP  Admit date: 02/17/2021 Discharge date: 02/24/2021  Admitted From: Home, Disposition:  Home   Recommendations for Outpatient Follow-up:  Follow up with PCP in 1-2 weeks Please obtain BMP/CBC in one week To continue her hemodialysis on TTS schedule. Patient will need referral to follow-up with ophthalmology as an outpatient given he is diabetic Adjust blood pressure medications as needed Home Health:NO Equipment/Devices:oxygen 2L  Discharge Condition:Stable CODE STATUS:FULL Diet recommendation: Renal / Carb Modified   Brief/Interim Summary:  [name] is a 50 y.o. male with medical history significant of HTN, DM, HLD, chronic systolic CHF, medical noncompliance presented to ED with chest pain that started a couple of weeks ago and has been intermittent in nature. Pain located over left lateral chest wall, under left breast, sharp 9 out of 10 with no radiation and no associated diaphoresis or exacerbating or relieving factors.  Had associated shortness of breath intermittently.  Also endorses orthopnea and uses 2 pillows to sleep.  Has lower extremity edema bilateral which started 3 to 4 months ago.  No fever, chills or any other complaint.  Patient is a poor historian.  Upon arrival to ED, he was afebrile with blood pressure of 202/115.  Otherwise saturating 100% on room air.  Troponin slightly elevated but flat. Chest x-ray shows stable cardiomegaly and chronic diffuse pulmonary vascular congestion.  No evidence of frank pulmonary edema. In ED given 60 mg IV Lasix, 100 mg hydralazine tablet, and 20 mg labetalol injection.  TRH was called and asked to admit.  Due to advanced and progressive CKD, nephrology was consulted and they recommended initiating dialysis while inpatient.  Vascular surgery did TDC and left IJ and placement of new right forearm AV graft on 02/20/2021.   Patient started on dialysis on 02/21/2021.    CKD stage V, with uremic symptoms, now ESRD -Progressed to ESRD -Management per renal, started on hemodialysis, per renal "ok for d/c with next HD at [facility] but needs to go Monday 10/10 3pm for paperwork" - S/p [facility] and AVG on 02/20/2021 by Dr. Scot Dock   Chest pain:  - Currently chest pain-free.  No acute ST-T wave changes on EKG.  Troponin elevated but flat and in fact not too far away from his baseline creatinine since it has been elevated since last 2 to 3 years likely due to demand ischemia in combination of advanced CKD.   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  No need to repeat echo.  - Volume management with HD. -He is hypoxic on room air, requiring 2 L nasal cannula on discharge.   Fever/leukocytosis:  -He developed fever of 100.4 and also developed leukocytosis of 17,000 on 02/18/2021.  Only one of the 4 blood culture sets is growing staph aureus/hominis, no MRSA.  Patient has remained afebrile since then.  Leukocytosis also improving.  UA negative and chest x-ray negative for infection.  No other indication of infection anywhere else.  Likely contaminant.  Continue to monitor off antibiotics for now.   Hypertensive urgency: Presented with blood pressure of 202/115.  History of noncompliance, he was resumed on his home medications, and now he is on dialysis hopefully blood pressure will continue to improve .  Poorly controlled type 2 diabetes mellitus  with hyperglycemia: Hemoglobin A1c 8.8.  He is supposed to be taking Lantus 35 units at home.  Currently on 20 units of Lantus and SSI and blood sugar controlled.  Apparently patient out of insulin has not been taking any for some time now, he will be discharged on lower dose Lantus at  25 units.   Hyperlipidemia: Continue statin.     Anemia of chronic disease: -Received IV iron during hospital stay.   Chronic visual deficit: Its been going on since 6 months according to H&P.  Patient did not mention anything to me about his vision.  Likely diabetic retinopathy in the face of being noncompliant.  Will need ophthalmology as outpatient.   Long history of noncompliance: Per nephrologist, patient has missed his several appointments with nephrology.  Counseling provided.    Discharge Diagnoses:  Active Problems:   Hyperlipidemia LDL goal <70   Stage 4 chronic kidney disease ([place])   Hypertensive crisis   Acute combined systolic and diastolic congestive heart failure (HCC)   Type 2 diabetes mellitus ([place])   Hypertensive urgency    Discharge Instructions  Discharge Instructions     Discharge instructions   Complete by: As directed    Follow with Primary MD Vevelyn Francois, NP in 7 days   Get CBC, CMP, 2 view Chest X ray checked  by Primary MD next visit.    Activity: As tolerated with Full fall precautions use walker/cane & assistance as needed   Disposition Home    Diet: Renal/carb modified , with feeding assistance and aspiration precautions.  For Heart failure patients - Check your Weight same time everyday, if you gain over 2 pounds, or you develop in leg swelling, experience more shortness of breath or chest pain, call your Primary MD immediately. Follow Cardiac Low Salt Diet and 1.5 lit/day fluid restriction.   On your next visit with your primary care physician please Get Medicines reviewed and adjusted.   Please request your Prim.MD to go over all Hospital Tests and Procedure/Radiological results at the follow up, please get all Hospital records sent to your Prim MD by signing hospital release before you go home.   If you experience worsening of your admission symptoms, develop shortness of breath, life threatening emergency, suicidal or homicidal  thoughts you must seek medical attention immediately by calling 911 or calling your MD immediately  if symptoms less severe.  You Must read complete instructions/literature along with all the possible adverse reactions/side effects for all the Medicines you take and that have been prescribed to you. Take any new Medicines after you have completely understood and accpet all the possible adverse reactions/side effects.   Do not drive, operating heavy machinery, perform activities at heights, swimming or participation in water activities or provide baby sitting services if your were admitted for syncope or siezures until you have seen by Primary MD or a Neurologist and advised to do so again.  Do not drive when taking Pain medications.    Do not take more than prescribed Pain, Sleep and Anxiety Medications  Special Instructions: If you have smoked or chewed Tobacco  in the last 2 yrs please stop smoking, stop any regular Alcohol  and or any Recreational drug use.  Wear Seat belts while driving.   Please note  You were cared for by a hospitalist during your hospital stay. If you have any questions about your discharge medications or the care you received while you were in the hospital after you are discharged,  you can call the unit and asked to speak with the hospitalist on call if the hospitalist that took care of you is not available. Once you are discharged, your primary care physician will handle any further medical issues. Please note that NO REFILLS for any discharge medications will be authorized once you are discharged, as it is imperative that you return to your primary care physician (or establish a relationship with a primary care physician if you do not have one) for your aftercare needs so that they can reassess your need for medications and monitor your lab values.   Increase activity slowly   Complete by: As directed    No wound care   Complete by: As directed       Allergies as  of 02/24/2021       Reactions   Sulfa Antibiotics Rash   Severe rash. Do not give sulfa medications   Bactrim [sulfamethoxazole-trimethoprim]    Rash   Cephalexin    Patient with severe drug reaction including exfoliating skin rash and hypotension after being prescribed both cephalexin and sulfamethoxazole-trimethoprim simultaneously. Favor SMX as much more likely culprit as patient had tolerated other cephalosporins in the past, but cannot say with complete certainty that this was not related to cephalexin.    Other    Patient states he is allergic to the TB test        Medication List     STOP taking these medications    furosemide 40 MG tablet Commonly known as: LASIX       TAKE these medications    Accu-Chek Guide test strip Generic drug: glucose blood Use to check fasting blood sugar once daily. 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 Insulin dependent   Accu-Chek Softclix Lancets lancets USE TO MEASURE BLOOD SUGAR TWICE A DAY   amLODipine 10 MG tablet Commonly known as: NORVASC Take 1 tablet (10 mg total) by mouth daily.   atorvastatin 40 MG tablet Commonly known as: LIPITOR Take 1 tablet (40 mg total) by mouth daily.   BD Pen Needle Nano U/F 32G X 4 MM Misc Generic drug: Insulin Pen Needle Use with insulin pens   calcitRIOL 0.25 MCG capsule Commonly known as: ROCALTROL Take 1 capsule (0.25 mcg total) by mouth daily. Start taking on: February 25, 2021   carvedilol 12.5 MG tablet Commonly known as: COREG Take 1 tablet (12.5 mg total) by mouth 2 (two) times daily with a meal. What changed:  medication strength how much to take when to take this   hydrALAZINE 25 MG tablet Commonly known as: APRESOLINE Take 1 tablet (25 mg total) by mouth 3 (three) times daily.   Levemir FlexTouch 100 UNIT/ML FlexPen Generic drug: insulin detemir Inject 25 Units into the skin daily. What changed: how much to take   NovoLOG FlexPen 100 UNIT/ML FlexPen Generic drug: insulin aspart Inject  5 Units into the skin 2 (two) times daily with a meal. What changed: how much to take   True Metrix Meter w/Device Kit Use to measure blood sugar twice a day   Accu-Chek Guide w/Device Kit Use as directed               Durable Medical Equipment  (From admission, onward)           Start     Ordered   02/24/21 1345  For home use only DME oxygen  Once       Question Answer Comment  Length of Need 6 Months   Mode or (  Route) Nasal cannula   Liters per Minute 2   Frequency Continuous (stationary and portable oxygen unit needed)   Oxygen conserving device Yes   Oxygen delivery system Gas      02/24/21 1345            Follow-up Information     [facility] Follow up on 03/05/2021.   Specialty: Internal Medicine Why: has an apt already scheduled Contact information: [place] [id number] [place] [phone]        [facility] up in 2 week(s).   Specialty: Vascular Surgery Why: Office will call you to arrange your appt (sent) Contact information: [place] [address] [phone]               Allergies  Allergen Reactions   Sulfa Antibiotics Rash    Severe rash. Do not give sulfa medications   Bactrim [Sulfamethoxazole-Trimethoprim]     Rash   Cephalexin     Patient with severe drug reaction including exfoliating skin rash and hypotension after being prescribed both cephalexin and sulfamethoxazole-trimethoprim simultaneously. Favor SMX as much more likely culprit as patient had tolerated other cephalosporins in the past, but cannot say with complete certainty that this was not related to cephalexin.    Other     Patient states he is allergic to the TB test    Consultations: Renal Vascular surgery   Procedures/Studies: DG Chest Port 1 View  Result Date: 02/20/2021 CLINICAL DATA:  Status post dialysis catheter  placement EXAM: PORTABLE CHEST 1 VIEW COMPARISON:  02/17/2021 FINDINGS: Cardiac shadow is enlarged in size but stable. New left jugular dialysis catheter is noted in satisfactory position. No pneumothorax is noted. Lungs are well aerated bilaterally. Some improvement in the degree of vascular congestion is noted when compared with the prior exam. IMPRESSION: New dialysis catheter placement without evidence of pneumothorax. Improvement in vascular congestion when compare with the prior exam. Electronically Signed   By: Inez Catalina M.D.   On: 02/20/2021 19:33   DG Chest Port 1 View  Result Date: 02/17/2021 CLINICAL DATA:  Chest pain and shortness of breath. EXAM: PORTABLE CHEST 1 VIEW COMPARISON:  11/27/2020 FINDINGS: Moderate cardiomegaly remains stable. Diffuse pulmonary vascular congestion is again demonstrated. No evidence of frank pulmonary edema or focal consolidation. No evidence of pleural effusion. IMPRESSION: Stable cardiomegaly and chronic diffuse pulmonary vascular congestion. Electronically Signed   By: Marlaine Hind M.D.   On: 02/17/2021 16:09   DG Fluoro Guide CV Line-No Report  Result Date: 02/20/2021 Fluoroscopy was utilized by the requesting physician.  No radiographic interpretation.   VAS [place] UPPER EXT VEIN MAPPING (PRE-OP AVF)  Result Date: 02/20/2021 UPPER EXTREMITY VEIN MAPPING Patient Name:  [name]  Date of Exam:   02/20/2021 Medical Rec #: [id number]        Accession #:    [id number] Date of Birth: Aug 08, 1970       Patient Gender: M Patient Age:   16 years Exam Location:  [facility] Procedure:      VAS [place] UPPER EXT VEIN MAPPING (PRE-OP AVF) Referring Phys: [name] --------------------------------------------------------------------------------  Indications: Pre-access. Comparison Study: No prior study Performing Technologist: Maudry Mayhew MHA, RDMS, RVT, RDCS  Examination Guidelines: A complete evaluation includes B-mode imaging, spectral Doppler, color  Doppler, and power Doppler as needed of all accessible portions of each vessel. Bilateral testing is considered an integral part of a  complete examination. Limited examinations for reoccurring indications may be performed as noted. +-----------------+-------------+----------+---------+ Right Cephalic   Diameter (cm)Depth (cm)Findings  +-----------------+-------------+----------+---------+ Shoulder             0.34                         +-----------------+-------------+----------+---------+ Prox upper arm       0.29                         +-----------------+-------------+----------+---------+ Mid upper arm        0.22                         +-----------------+-------------+----------+---------+ Dist upper arm       0.20                         +-----------------+-------------+----------+---------+ Antecubital fossa    0.23               branching +-----------------+-------------+----------+---------+ Prox forearm         0.23                         +-----------------+-------------+----------+---------+ Mid forearm          0.18                         +-----------------+-------------+----------+---------+ Dist forearm         0.19                         +-----------------+-------------+----------+---------+ Wrist                0.18                         +-----------------+-------------+----------+---------+ +-----------------+-------------+----------+--------------+ Right Basilic    Diameter (cm)Depth (cm)   Findings    +-----------------+-------------+----------+--------------+ Prox upper arm       0.28                              +-----------------+-------------+----------+--------------+ Mid upper arm        0.22                              +-----------------+-------------+----------+--------------+ Dist upper arm       0.25                              +-----------------+-------------+----------+--------------+ Antecubital fossa     0.18                 branching    +-----------------+-------------+----------+--------------+ Prox forearm         0.10                              +-----------------+-------------+----------+--------------+ Mid forearm          0.10                              +-----------------+-------------+----------+--------------+ Distal forearm       0.12  branching    +-----------------+-------------+----------+--------------+ Wrist                                   not visualized +-----------------+-------------+----------+--------------+ +-----------------+-------------+----------+---------------------+ Left Cephalic    Diameter (cm)Depth (cm)      Findings        +-----------------+-------------+----------+---------------------+ Shoulder             0.33                                     +-----------------+-------------+----------+---------------------+ Prox upper arm       0.32                                     +-----------------+-------------+----------+---------------------+ Mid upper arm        0.34                                     +-----------------+-------------+----------+---------------------+ Dist upper arm       0.26                                     +-----------------+-------------+----------+---------------------+ Antecubital fossa    0.21                                     +-----------------+-------------+----------+---------------------+ Prox forearm         0.42                                     +-----------------+-------------+----------+---------------------+ Mid forearm          0.37                    thrombosed       +-----------------+-------------+----------+---------------------+ Dist forearm                            IV and not visualized +-----------------+-------------+----------+---------------------+ Left basilic vein not evaluated *See table(s) above for measurements and observations.  Diagnosing  physician: [name] MD Electronically signed by [name] MD on 02/20/2021 at 5:34:50 PM.    Final    (Echo, Carotid, EGD, Colonoscopy, ERCP)    Subjective:  Denies any complaints today, he did tolerate his hemodialysis with no significant events.  Discharge Exam: Vitals:   02/24/21 1244 02/24/21 1305  BP: (!) 172/70 (!) 163/67  Pulse:  83  Resp:  (!) 25  Temp:  98 F (36.7 C)  SpO2:  100%   Vitals:   02/24/21 1200 02/24/21 1208 02/24/21 1244 02/24/21 1305  BP: (!) 172/70 138/77 (!) 172/70 (!) 163/67  Pulse: 83 82  83  Resp: 14   (!) 25  Temp:  98.4 F (36.9 C)  98 F (36.7 C)  TempSrc:  Oral  Oral  SpO2: 100% 100%  100%  Weight:  78 kg    Height:        General: Pt is alert, awake, not  in acute distress Cardiovascular: RRR, S1/S2 +, no rubs, no gallops Respiratory: CTA bilaterally, no wheezing, no rhonchi Abdominal: Soft, NT, ND, bowel sounds + Extremities: no edema, no cyanosis    The results of significant diagnostics from this hospitalization (including imaging, microbiology, ancillary and laboratory) are listed below for reference.     Microbiology: Recent Results (from the past 240 hour(s))  Resp Panel by RT-PCR (Flu A&B, Covid) Nasopharyngeal Swab     Status: None   Collection Time: 02/17/21  4:45 PM   Specimen: Nasopharyngeal Swab; Nasopharyngeal(NP) swabs in vial transport medium  Result Value Ref Range Status   SARS Coronavirus 2 by RT PCR NEGATIVE NEGATIVE Final    Comment: (NOTE) SARS-CoV-2 target nucleic acids are NOT DETECTED.  The SARS-CoV-2 RNA is generally detectable in upper respiratory specimens during the acute phase of infection. The lowest concentration of SARS-CoV-2 viral copies this assay can detect is 138 copies/mL. A negative result does not preclude SARS-Cov-2 infection and should not be used as the sole basis for treatment or other patient management decisions. A negative result may occur with  improper  specimen collection/handling, submission of specimen other than nasopharyngeal swab, presence of viral mutation(s) within the areas targeted by this assay, and inadequate number of viral copies(<138 copies/mL). A negative result must be combined with clinical observations, patient history, and epidemiological information. The expected result is Negative.  Fact Sheet for Patients:  EntrepreneurPulse.com.au  Fact Sheet for Healthcare Providers:  IncredibleEmployment.be  This test is no t yet approved or cleared by the [place] FDA and  has been authorized for detection and/or diagnosis of SARS-CoV-2 by FDA under an Emergency Use Authorization (EUA). This EUA will remain  in effect (meaning this test can be used) for the duration of the COVID-19 declaration under Section 564(b)(1) of the Act, 21 U.S.C.section 360bbb-3(b)(1), unless the authorization is terminated  or revoked sooner.       Influenza A by PCR NEGATIVE NEGATIVE Final   Influenza B by PCR NEGATIVE NEGATIVE Final    Comment: (NOTE) The Xpert Xpress SARS-CoV-2/FLU/RSV plus assay is intended as an aid in the diagnosis of influenza from Nasopharyngeal swab specimens and should not be used as a sole basis for treatment. Nasal washings and aspirates are unacceptable for Xpert Xpress SARS-CoV-2/FLU/RSV testing.  Fact Sheet for Patients: EntrepreneurPulse.com.au  Fact Sheet for Healthcare Providers: IncredibleEmployment.be  This test is not yet approved or cleared by the [place] FDA and has been authorized for detection and/or diagnosis of SARS-CoV-2 by FDA under an Emergency Use Authorization (EUA). This EUA will remain in effect (meaning this test can be used) for the duration of the COVID-19 declaration under Section 564(b)(1) of the Act, 21 U.S.C. section 360bbb-3(b)(1), unless the authorization is terminated or revoked.  Performed at  [facility], [place] [address], [place], [place] [address]   MRSA Next Gen by PCR, Nasal     Status: None   Collection Time: 02/17/21 10:09 PM   Specimen: Nasal Mucosa; Nasal Swab  Result Value Ref Range Status   MRSA by PCR Next Gen NOT DETECTED NOT DETECTED Final    Comment: (NOTE) The GeneXpert MRSA Assay (FDA approved for NASAL specimens only), is one component of a comprehensive MRSA colonization surveillance program. It is not intended to diagnose MRSA infection nor to guide or monitor treatment for MRSA infections. Test performance is not FDA approved in patients less than 17 years old. Performed at [facility], [place]  [address] [place], [place] [address]   Culture, blood (routine x 2)     Status: None   Collection Time: 02/18/21 12:24 AM   Specimen: BLOOD  Result Value Ref Range Status   Specimen Description BLOOD RIGHT ANTECUBITAL  Final   Special Requests   Final    BOTTLES DRAWN AEROBIC AND ANAEROBIC Blood Culture adequate volume   Culture   Final    NO GROWTH 5 DAYS Performed at [facility], [address], [place], [place] [address]    Report Status 02/23/2021 FINAL  Final  Culture, blood (routine x 2)     Status: Abnormal   Collection Time: 02/18/21 12:24 AM   Specimen: BLOOD RIGHT HAND  Result Value Ref Range Status   Specimen Description BLOOD RIGHT HAND  Final   Special Requests   Final    BOTTLES DRAWN AEROBIC AND ANAEROBIC Blood Culture adequate volume   Culture  Setup Time   Final    GRAM POSITIVE COCCI AEROBIC BOTTLE ONLY CRITICAL RESULT CALLED TO, READ BACK BY AND VERIFIED WITH: L,CHEN PHARMD @1840  02/18/21 EB    Culture (A)  Final    STAPHYLOCOCCUS HOMINIS THE SIGNIFICANCE OF ISOLATING THIS ORGANISM FROM A SINGLE SET OF BLOOD CULTURES WHEN MULTIPLE SETS ARE DRAWN IS UNCERTAIN. PLEASE NOTIFY THE MICROBIOLOGY DEPARTMENT WITHIN ONE WEEK IF SPECIATION AND SENSITIVITIES ARE REQUIRED. Performed at [facility], [place] [address]  [place], [place] [address]    Report Status 02/19/2021 FINAL  Final  Blood Culture ID Panel (Reflexed)     Status: Abnormal   Collection Time: 02/18/21 12:24 AM  Result Value Ref Range Status   Enterococcus faecalis NOT DETECTED NOT DETECTED Final   Enterococcus Faecium NOT DETECTED NOT DETECTED Final   Listeria monocytogenes NOT DETECTED NOT DETECTED Final   Staphylococcus species DETECTED (A) NOT DETECTED Final    Comment: CRITICAL RESULT CALLED TO, READ BACK BY AND VERIFIED WITH: L,CHEN PHARMD @1840  02/18/21 EB    Staphylococcus aureus (BCID) NOT DETECTED NOT DETECTED Final   Staphylococcus epidermidis NOT DETECTED NOT DETECTED Final   Staphylococcus lugdunensis NOT DETECTED NOT DETECTED Final   Streptococcus species NOT DETECTED NOT DETECTED Final   Streptococcus agalactiae NOT DETECTED NOT DETECTED Final   Streptococcus pneumoniae NOT DETECTED NOT DETECTED Final   Streptococcus pyogenes NOT DETECTED NOT DETECTED Final   A.calcoaceticus-baumannii NOT DETECTED NOT DETECTED Final   Bacteroides fragilis NOT DETECTED NOT DETECTED Final   Enterobacterales NOT DETECTED NOT DETECTED Final   Enterobacter cloacae complex NOT DETECTED NOT DETECTED Final   Escherichia coli NOT DETECTED NOT DETECTED Final   Klebsiella aerogenes NOT DETECTED NOT DETECTED Final   Klebsiella oxytoca NOT DETECTED NOT DETECTED Final   Klebsiella pneumoniae NOT DETECTED NOT DETECTED Final   Proteus species NOT DETECTED NOT DETECTED Final   Salmonella species NOT DETECTED NOT DETECTED Final   Serratia marcescens NOT DETECTED NOT DETECTED Final   Haemophilus influenzae NOT DETECTED NOT DETECTED Final   Neisseria meningitidis NOT DETECTED NOT DETECTED Final   Pseudomonas aeruginosa NOT DETECTED NOT DETECTED Final   Stenotrophomonas maltophilia NOT DETECTED NOT DETECTED Final   Candida albicans NOT DETECTED NOT DETECTED Final   Candida auris NOT DETECTED NOT DETECTED Final   Candida glabrata NOT DETECTED NOT DETECTED  Final   Candida krusei NOT DETECTED NOT DETECTED Final   Candida parapsilosis NOT DETECTED NOT DETECTED Final   Candida tropicalis NOT DETECTED NOT DETECTED Final   Cryptococcus neoformans/gattii NOT DETECTED NOT DETECTED  Final    Comment: Performed at [facility], [place] [address] [place], [place] [address]     Labs: BNP (last 3 results) Recent Labs    08/23/20 0152 11/27/20 0609 02/17/21 1527  BNP 192.4* 1,009.9* 1,941.7*   Basic Metabolic Panel: Recent Labs  Lab 02/17/21 2221 02/18/21 0024 02/19/21 0018 02/20/21 0946 02/21/21 0114 02/22/21 1018 02/24/21 0825  NA  --  139 133* 136  --  134* 135  K  --  3.6 4.1 3.5  --  3.8 3.9  CL  --  109 107 105  --  100 101  CO2  --  18* 17* 21*  --  25 27  GLUCOSE  --  192* 102* 91  --  127* 141*  BUN  --  51* 59* 60*  --  43* 36*  CREATININE  --  5.27* 6.38* 6.92*  --  6.29* 6.54*  CALCIUM  --  6.9* 6.2* 7.1* 6.9* 7.1* 7.7*  MG 1.5*  --   --   --   --   --   --   PHOS 4.9*  --   --   --   --  4.8* 5.0*   Liver Function Tests: Recent Labs  Lab 02/17/21 1527 02/22/21 1018 02/24/21 0825  AST 20  --   --   ALT 13  --   --   ALKPHOS 105  --   --   BILITOT 0.4  --   --   PROT 5.5*  --   --   ALBUMIN 1.9* 1.5* 1.5*   No results for input(s): LIPASE, AMYLASE in the last 168 hours. No results for input(s): AMMONIA in the last 168 hours. CBC: Recent Labs  Lab 02/19/21 0018 02/21/21 0114 02/22/21 0109 02/22/21 1018 02/23/21 1135 02/24/21 0825  WBC 11.0* 8.5 7.9 8.3 9.1 12.0*  NEUTROABS 8.3* 6.4 5.5 6.0  --   --   HGB 8.2* 8.2* 7.9* 7.2* 7.9* 7.2*  HCT 27.0* 26.1* 25.1* 23.6* 25.6* 23.2*  MCV 87.9 84.7 83.9 86.1 86.8 86.9  PLT 276 328 308 300 328 319   Cardiac Enzymes: No results for input(s): CKTOTAL, CKMB, CKMBINDEX, TROPONINI in the last 168 hours. BNP: Invalid input(s): POCBNP CBG: Recent Labs  Lab 02/23/21 1224 02/23/21 1657 02/23/21 2125 02/24/21 0757 02/24/21 1304  GLUCAP 138* 127* 152* 134*  103*   D-Dimer No results for input(s): DDIMER in the last 72 hours. Hgb A1c No results for input(s): HGBA1C in the last 72 hours. Lipid Profile No results for input(s): CHOL, HDL, LDLCALC, TRIG, CHOLHDL, LDLDIRECT in the last 72 hours. Thyroid function studies No results for input(s): TSH, T4TOTAL, T3FREE, THYROIDAB in the last 72 hours.  Invalid input(s): FREET3 Anemia work up Recent Labs    02/23/21 1135  VITAMINB12 481  FOLATE 10.9  FERRITIN 526*  TIBC 227*  IRON 28*  RETICCTPCT 2.2   Urinalysis    Component Value Date/Time   COLORURINE YELLOW 02/19/2021 0045   APPEARANCEUR HAZY (A) 02/19/2021 0045   APPEARANCEUR Clear 05/09/2020 1509   LABSPEC 1.013 02/19/2021 0045   PHURINE 5.0 02/19/2021 0045   GLUCOSEU >=500 (A) 02/19/2021 0045   HGBUR MODERATE (A) 02/19/2021 0045   BILIRUBINUR NEGATIVE 02/19/2021 0045   BILIRUBINUR Negative 05/09/2020 1509   KETONESUR NEGATIVE 02/19/2021 0045   PROTEINUR >=300 (A) 02/19/2021 0045   UROBILINOGEN 0.2 02/04/2020 1612   UROBILINOGEN 0.2 02/16/2014 0045   NITRITE NEGATIVE 02/19/2021 0045   LEUKOCYTESUR NEGATIVE 02/19/2021 0045  Sepsis Labs Invalid input(s): PROCALCITONIN,  WBC,  LACTICIDVEN Microbiology Recent Results (from the past 240 hour(s))  Resp Panel by RT-PCR (Flu A&B, Covid) Nasopharyngeal Swab     Status: None   Collection Time: 02/17/21  4:45 PM   Specimen: Nasopharyngeal Swab; Nasopharyngeal(NP) swabs in vial transport medium  Result Value Ref Range Status   SARS Coronavirus 2 by RT PCR NEGATIVE NEGATIVE Final    Comment: (NOTE) SARS-CoV-2 target nucleic acids are NOT DETECTED.  The SARS-CoV-2 RNA is generally detectable in upper respiratory specimens during the acute phase of infection. The lowest concentration of SARS-CoV-2 viral copies this assay can detect is 138 copies/mL. A negative result does not preclude SARS-Cov-2 infection and should not be used as the sole basis for treatment or other patient  management decisions. A negative result may occur with  improper specimen collection/handling, submission of specimen other than nasopharyngeal swab, presence of viral mutation(s) within the areas targeted by this assay, and inadequate number of viral copies(<138 copies/mL). A negative result must be combined with clinical observations, patient history, and epidemiological information. The expected result is Negative.  Fact Sheet for Patients:  EntrepreneurPulse.com.au  Fact Sheet for Healthcare Providers:  IncredibleEmployment.be  This test is no t yet approved or cleared by the [place] FDA and  has been authorized for detection and/or diagnosis of SARS-CoV-2 by FDA under an Emergency Use Authorization (EUA). This EUA will remain  in effect (meaning this test can be used) for the duration of the COVID-19 declaration under Section 564(b)(1) of the Act, 21 U.S.C.section 360bbb-3(b)(1), unless the authorization is terminated  or revoked sooner.       Influenza A by PCR NEGATIVE NEGATIVE Final   Influenza B by PCR NEGATIVE NEGATIVE Final    Comment: (NOTE) The Xpert Xpress SARS-CoV-2/FLU/RSV plus assay is intended as an aid in the diagnosis of influenza from Nasopharyngeal swab specimens and should not be used as a sole basis for treatment. Nasal washings and aspirates are unacceptable for Xpert Xpress SARS-CoV-2/FLU/RSV testing.  Fact Sheet for Patients: EntrepreneurPulse.com.au  Fact Sheet for Healthcare Providers: IncredibleEmployment.be  This test is not yet approved or cleared by the [place] FDA and has been authorized for detection and/or diagnosis of SARS-CoV-2 by FDA under an Emergency Use Authorization (EUA). This EUA will remain in effect (meaning this test can be used) for the duration of the COVID-19 declaration under Section 564(b)(1) of the Act, 21 U.S.C. section 360bbb-3(b)(1),  unless the authorization is terminated or revoked.  Performed at [facility], [place] [address] [place], [place] [address]   MRSA Next Gen by PCR, Nasal     Status: None   Collection Time: 02/17/21 10:09 PM   Specimen: Nasal Mucosa; Nasal Swab  Result Value Ref Range Status   MRSA by PCR Next Gen NOT DETECTED NOT DETECTED Final    Comment: (NOTE) The GeneXpert MRSA Assay (FDA approved for NASAL specimens only), is one component of a comprehensive MRSA colonization surveillance program. It is not intended to diagnose MRSA infection nor to guide or monitor treatment for MRSA infections. Test performance is not FDA approved in patients less than 47 years old. Performed at [facility], [place] [address] [place], [place] [address]   Culture, blood (routine x 2)     Status: None   Collection Time: 02/18/21 12:24 AM   Specimen: BLOOD  Result Value Ref Range Status   Specimen Description BLOOD RIGHT ANTECUBITAL  Final   Special Requests  Final    BOTTLES DRAWN AEROBIC AND ANAEROBIC Blood Culture adequate volume   Culture   Final    NO GROWTH 5 DAYS Performed at [facility], [place] [address], [place], [place] [address]    Report Status 02/23/2021 FINAL  Final  Culture, blood (routine x 2)     Status: Abnormal   Collection Time: 02/18/21 12:24 AM   Specimen: BLOOD RIGHT HAND  Result Value Ref Range Status   Specimen Description BLOOD RIGHT HAND  Final   Special Requests   Final    BOTTLES DRAWN AEROBIC AND ANAEROBIC Blood Culture adequate volume   Culture  Setup Time   Final    GRAM POSITIVE COCCI AEROBIC BOTTLE ONLY CRITICAL RESULT CALLED TO, READ BACK BY AND VERIFIED WITH: L,CHEN PHARMD @1840  02/18/21 EB    Culture (A)  Final    STAPHYLOCOCCUS HOMINIS THE SIGNIFICANCE OF ISOLATING THIS ORGANISM FROM A SINGLE SET OF BLOOD CULTURES WHEN MULTIPLE SETS ARE DRAWN IS UNCERTAIN. PLEASE NOTIFY THE MICROBIOLOGY DEPARTMENT WITHIN ONE WEEK IF SPECIATION AND SENSITIVITIES  ARE REQUIRED. Performed at [facility], [place] [address], [place], [place] [address]    Report Status 02/19/2021 FINAL  Final  Blood Culture ID Panel (Reflexed)     Status: Abnormal   Collection Time: 02/18/21 12:24 AM  Result Value Ref Range Status   Enterococcus faecalis NOT DETECTED NOT DETECTED Final   Enterococcus Faecium NOT DETECTED NOT DETECTED Final   Listeria monocytogenes NOT DETECTED NOT DETECTED Final   Staphylococcus species DETECTED (A) NOT DETECTED Final    Comment: CRITICAL RESULT CALLED TO, READ BACK BY AND VERIFIED WITH: L,CHEN PHARMD @1840  02/18/21 EB    Staphylococcus aureus (BCID) NOT DETECTED NOT DETECTED Final   Staphylococcus epidermidis NOT DETECTED NOT DETECTED Final   Staphylococcus lugdunensis NOT DETECTED NOT DETECTED Final   Streptococcus species NOT DETECTED NOT DETECTED Final   Streptococcus agalactiae NOT DETECTED NOT DETECTED Final   Streptococcus pneumoniae NOT DETECTED NOT DETECTED Final   Streptococcus pyogenes NOT DETECTED NOT DETECTED Final   A.calcoaceticus-baumannii NOT DETECTED NOT DETECTED Final   Bacteroides fragilis NOT DETECTED NOT DETECTED Final   Enterobacterales NOT DETECTED NOT DETECTED Final   Enterobacter cloacae complex NOT DETECTED NOT DETECTED Final   Escherichia coli NOT DETECTED NOT DETECTED Final   Klebsiella aerogenes NOT DETECTED NOT DETECTED Final   Klebsiella oxytoca NOT DETECTED NOT DETECTED Final   Klebsiella pneumoniae NOT DETECTED NOT DETECTED Final   Proteus species NOT DETECTED NOT DETECTED Final   Salmonella species NOT DETECTED NOT DETECTED Final   Serratia marcescens NOT DETECTED NOT DETECTED Final   Haemophilus influenzae NOT DETECTED NOT DETECTED Final   Neisseria meningitidis NOT DETECTED NOT DETECTED Final   Pseudomonas aeruginosa NOT DETECTED NOT DETECTED Final   Stenotrophomonas maltophilia NOT DETECTED NOT DETECTED Final   Candida albicans NOT DETECTED NOT DETECTED Final   Candida auris NOT  DETECTED NOT DETECTED Final   Candida glabrata NOT DETECTED NOT DETECTED Final   Candida krusei NOT DETECTED NOT DETECTED Final   Candida parapsilosis NOT DETECTED NOT DETECTED Final   Candida tropicalis NOT DETECTED NOT DETECTED Final   Cryptococcus neoformans/gattii NOT DETECTED NOT DETECTED Final    Comment: Performed at [facility], [place] [address] [place], [place] [address]     Time coordinating discharge: Over 30 minutes  SIGNED:   Phillips Climes, MD  [facility] 02/24/2021, 2:51 PM Pager   If 7PM-7AM, please contact night-coverage  [email or web address] Password [email or web address]

## 2021-02-24 NOTE — Progress Notes (Addendum)
[facility] Progress Note   Subjective:   Overall feeing ok.  3104m  UOP yesterday.  On 3rd HD today then d/c with next HD Tues outpt.   Objective Vitals:   02/24/21 1000 02/24/21 1030 02/24/21 1100 02/24/21 1130  BP: 122/65 128/69 (!) 148/74 138/73  Pulse:      Resp:      Temp:      TempSrc:      SpO2:      Weight:      Height:       Physical Exam General: nontoxic lying in bed on HD Heart: RRR Lungs: clear, normal WOB Abdomen: soft, nontender Extremities: 1+ LE edema Dialysis Access: RIJ TDC, RUE AVG +t/b, 2+ radial pulse and hand warm but c/o some pain stable - RUE has 1+ pitting edema now that is new  Additional Objective Labs: Basic Metabolic Panel: Recent Labs  Lab 02/17/21 2221 02/18/21 0024 02/20/21 0946 02/21/21 0114 02/22/21 1018 02/24/21 0825  NA  --    < > 136  --  134* 135  K  --    < > 3.5  --  3.8 3.9  CL  --    < > 105  --  100 101  CO2  --    < > 21*  --  25 27  GLUCOSE  --    < > 91  --  127* 141*  BUN  --    < > 60*  --  43* 36*  CREATININE  --    < > 6.92*  --  6.29* 6.54*  CALCIUM  --    < > 7.1* 6.9* 7.1* 7.7*  PHOS 4.9*  --   --   --  4.8* 5.0*   < > = values in this interval not displayed.    Liver Function Tests: Recent Labs  Lab 02/17/21 1527 02/22/21 1018 02/24/21 0825  AST 20  --   --   ALT 13  --   --   ALKPHOS 105  --   --   BILITOT 0.4  --   --   PROT 5.5*  --   --   ALBUMIN 1.9* 1.5* 1.5*    No results for input(s): LIPASE, AMYLASE in the last 168 hours. CBC: Recent Labs  Lab 02/21/21 0114 02/22/21 0109 02/22/21 1018 02/23/21 1135 02/24/21 0825  WBC 8.5 7.9 8.3 9.1 12.0*  NEUTROABS 6.4 5.5 6.0  --   --   HGB 8.2* 7.9* 7.2* 7.9* 7.2*  HCT 26.1* 25.1* 23.6* 25.6* 23.2*  MCV 84.7 83.9 86.1 86.8 86.9  PLT 328 308 300 328 319    Blood Culture    Component Value Date/Time   SDES BLOOD RIGHT ANTECUBITAL 02/18/2021 0024   SDES BLOOD RIGHT HAND 02/18/2021 0024   SPECREQUEST  02/18/2021 0024     BOTTLES DRAWN AEROBIC AND ANAEROBIC Blood Culture adequate volume   SPECREQUEST  02/18/2021 0024    BOTTLES DRAWN AEROBIC AND ANAEROBIC Blood Culture adequate volume   CULT  02/18/2021 0024    NO GROWTH 5 DAYS Performed at [facility] [address], [place] [address]   CULT (A) 02/18/2021 0024    STAPHYLOCOCCUS HOMINIS THE SIGNIFICANCE OF ISOLATING THIS ORGANISM FROM A SINGLE SET OF BLOOD CULTURES WHEN MULTIPLE SETS ARE DRAWN IS UNCERTAIN. PLEASE NOTIFY THE MICROBIOLOGY DEPARTMENT WITHIN ONE WEEK IF SPECIATION AND SENSITIVITIES ARE REQUIRED. Performed at [facility] [address], [place] 02/23/2021 FINAL 02/18/2021 0024   REPTSTATUS 02/19/2021 FINAL 02/18/2021 0024    Cardiac Enzymes: No results for input(s): CKTOTAL, CKMB, CKMBINDEX, TROPONINI in the last 168 hours. CBG: Recent Labs  Lab 02/23/21 0833 02/23/21 1224 02/23/21 1657 02/23/21 2125 02/24/21 0757  GLUCAP 98 138* 127* 152* 134*    Iron Studies:  Recent Labs    02/23/21 1135  IRON 28*  TIBC 227*  FERRITIN 526*    '@lablastinr3'$ @ Studies/Results: No results found. Medications:  sodium chloride 10 mL/hr at 02/20/21 1159   sodium chloride     sodium chloride     sodium chloride      amLODipine  10 mg Oral Daily   aspirin EC  81 mg Oral Daily   atorvastatin  40 mg Oral Daily   calcitRIOL  0.25 mcg Oral Daily   carvedilol  12.5 mg Oral BID WC   Chlorhexidine Gluconate Cloth  6 each Topical Q0600   darbepoetin (ARANESP) injection - DIALYSIS  60 mcg Intravenous Q Sat-HD   heparin  5,000 Units Subcutaneous Q8H   heparin sodium (porcine)       heparin sodium (porcine)  3,800 Units Intracatheter Once   hydrALAZINE  25 mg Oral TID   insulin aspart  0-9 Units Subcutaneous TID WC   insulin detemir  20 Units Subcutaneous Daily   nitroGLYCERIN  0.4 mg Transdermal Daily   sodium chloride flush  3 mL Intravenous Q12H     Assessment/Recommendations:  [name] is a/an 50 y.o. male with a past medical history HTN, DM2, HLD, HFrEF (EF 30 to 35% in July 2022), CKD who present w/ chest pain and progressive CKD   CKD V now with uremic symptoms > progressed to ESRD:   Having low grade uremic symptoms now and on history of poor adherence/follow up he's agreeable to starting dialysis while in hospital.   -s/p [facility] and AVG 10/4, Dr. Scot Dock --> has some mild stable steal symptoms with great radial pulse; now with some mild arm edema which can be monitored outpt -HD #1 10/5, plan #2 10/6  and #3 today -CLIP to outpt unit, navigator aware -- ok for d/c with next HD at [facility] but needs to go Monday 10/10 3pm for paperwork -Dose meds for ESRD -Monitor Daily I/Os, Daily weight  -Maintain MAP>65 for optimal renal perfusion.  -Avoid nephrotoxic medications including NSAIDs and Vanc/Zosyn combo  Secondary Hyperparathyroidism:  PTH 78, phos ok, calcitriol for hypoca, use 2.5ca dialysate for now - corr improving   HFrEF: History of heart failure with EF of 30 to 35% in July 2022.  -Hypertension management as below -Volume mgmt with HD - doesn't make enough urine to justify daily diuretic.   Hypertension: Currently on amlodipine and coreg with reasonable BPs.    Anemia due to CKD: Hemoglobin 10.4 > 8.2>7.9>7.2.  No gross bleeding noted.  Iron sat 8 and ferritin 68. IV iron '250mg'$  daily x4 doses. ESA with HD. Continue to monitor and transfuse < 7.    Uncontrolled Diabetes Mellitus Type 2 with Hyperglycemia: Management per primary   Metabolic acidosis: now resolved with HD  D/c today.   [name] MD 02/24/2021, 11:53 AM  [facility] Pager: [phone]

## 2021-02-24 NOTE — TOC Transition Note (Addendum)
Transition of Care [facility]) - CM/SW Discharge Note   Patient Details  Name: [name] MRN: [id number] Date of Birth: [birth year]  [facility]) CM/SW Contact:  Douglas Collet, RN Phone Number: 02/24/2021, 1:54 PM   Clinical Narrative:    13:00 Patient provided with [place] letter (reinstated, he normally goes to [facility] pharmacy and it is closed on the weekends). Patient qualifies for home oxygen, order placed, and referral made to [facility] for assessment of charity home oxygen. Patient states that he will have transportation home.  [place] letter faxed to [place], spoke w pharmacist there.   16:00  Spoke w patient's mother and brother with permission of patient. [name] at [phone]. 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 Reviewed times of HD beginning at 11:50 and it usually lasting 4 hours, would allow [name] to be home in the early evening to sleep until he had to go work, but then both [name] and his mother offered additional scheduling conflicts for not being able to assist with transportation. Transportation for HD needs established. Consulted w CSW who confirms that this will need to be arranged M-F when offices open. DC cancelled. MD aware.     Final next level of care: Home/Self Care Barriers to Discharge: No Barriers Identified   Patient Goals and CMS Choice Patient states their goals for this hospitalization and ongoing recovery are:: return home   Choice offered to / list presented to : NA  Discharge Placement                       Discharge Plan and Services   Discharge Planning Services: CM Consult Post Acute Care Choice: NA          DME Arranged: Oxygen DME Agency: [facility] Date DME Agency Contacted:  02/24/21 Time DME Agency Contacted: [id number] Representative spoke with at DME Agency: [place]ann[place]: NA          Social Determinants of Health ([place]) Interventions     Readmission Risk Interventions Readmission Risk Prevention Plan 02/19/2021  Transportation Screening Complete  Medication Review [employer]) Complete  PCP or Specialist appointment within 3-5 days of discharge Complete  HRI or [place] Complete  SW Recovery Care/Counseling Consult Complete  [place] Not Applicable  Some recent data might be hidden

## 2021-02-24 NOTE — Progress Notes (Signed)
SATURATION QUALIFICATIONS: (This note is used to comply with regulatory documentation for home oxygen)  Patient Saturations on Room Air at Rest = 83%  Patient Saturations on Room Air while Ambulating = 76%  Patient Saturations on 2 Liters of oxygen while Ambulating = 98%

## 2021-02-25 LAB — GLUCOSE, CAPILLARY
Glucose-Capillary: 201 mg/dL — ABNORMAL HIGH (ref 70–99)
Glucose-Capillary: 106 mg/dL — ABNORMAL HIGH (ref 70–99)
Glucose-Capillary: 95 mg/dL (ref 70–99)
Glucose-Capillary: 118 mg/dL — ABNORMAL HIGH (ref 70–99)

## 2021-02-25 NOTE — Progress Notes (Signed)
PROGRESS NOTE    [name]  [id number] DOB: Jan 09, 1971 DOA: 02/17/2021 PCP: Vevelyn Francois, NP   Brief Narrative:   [name] is a 50 y.o. male with medical history significant of HTN, DM, HLD, chronic systolic CHF, medical noncompliance presented to ED with chest pain that started a couple of weeks ago and has been intermittent in nature. Pain located over left lateral chest wall, under left breast, sharp 9 out of 10 with no radiation and no associated diaphoresis or exacerbating or relieving factors.  Had associated shortness of breath intermittently.  Also endorses orthopnea and uses 2 pillows to sleep.  Has lower extremity edema bilateral which started 3 to 4 months ago.  No fever, chills or any other complaint.  Patient is a poor historian.  Upon arrival to ED, he was afebrile with blood pressure of 202/115.  Otherwise saturating 100% on room air.  Troponin slightly elevated but flat. Chest x-ray shows stable cardiomegaly and chronic diffuse pulmonary vascular congestion.  No evidence of frank pulmonary edema. In ED given 60 mg IV Lasix, 100 mg hydralazine tablet, and 20 mg labetalol injection.  TRH was called and asked to admit.  Due to advanced and progressive CKD, nephrology was consulted and they recommended initiating dialysis while inpatient.  Vascular surgery did TDC and left IJ and placement of new right forearm AV graft on 02/20/2021.  Patient started on dialysis on 02/21/2021.  Assessment & Plan:   Active Problems:   Hyperlipidemia LDL goal <70   Stage 4 chronic kidney disease ([place])   Hypertensive crisis   Acute combined systolic and diastolic congestive heart failure (HCC)   Type 2 diabetes mellitus ([place])   Hypertensive urgency   CKD stage V, with uremic symptoms, -Progressed to ESRD -Management per renal, started on hemodialysis, social worker trying to arrange transportation to his HD center, next session is scheduled for Tuesday. - S/p TDC and AVG on  02/20/2021 by Dr. Scot Dock  Chest pain:  - Currently chest pain-free.  No acute ST-T wave changes on EKG.  Troponin elevated but flat and in fact not too far away from his baseline creatinine since it has been elevated since last 2 to 3 years likely due to demand ischemia in combination of advanced CKD.  Dyspnea/acute on chronic combined diastolic and systolic congestive heart failure - Echo done in July this year shows 30% ejection fraction with grade 1 diastolic dysfunction and left ventricular global hypokinesis.  No need to repeat echo.  - Volume management with HD. - on 2 L [place]  Fever/leukocytosis:  -He developed fever of 100.4 and also developed leukocytosis of 17,000 on 02/18/2021.  Only one of the 4 blood culture sets is growing staph aureus/hominis, no MRSA.  Patient has remained afebrile since then.  Leukocytosis also improving.  UA negative and chest x-ray negative for infection.  No other indication of infection anywhere else.  Likely contaminant.  Continue to monitor off antibiotics for now.  Hypertensive urgency: Presented with blood pressure of 202/115.  History of noncompliance, he was resumed on his home medications, and now he is on dialysis hopefully blood pressure will continue to improve  Poorly controlled type 2 diabetes mellitus with hyperglycemia: Hemoglobin A1c 8.8.  He is supposed to be taking Lantus 35 units at home.  Currently on 20 units of Lantus and SSI and blood sugar controlled.  Continue this.   Hyperlipidemia: Continue statin.   Anemia of chronic disease: -No evidence of GI bleed.  Chronic visual deficit: Its been going on since 6 months according to H&P.  Patient did not mention anything to me about his vision.  Likely diabetic retinopathy in the face of being noncompliant.  Will need ophthalmology as outpatient.  Long history of noncompliance: Per nephrologist, patient has missed his several appointments with nephrology.  Counseling provided.  DVT  prophylaxis: heparin injection 5,000 Units Start: 02/17/21 2200   Code Status: Full Code  Family Communication:  None present at bedside.  Plan of care discussed with patient in length and he verbalized understanding and agreed with it.  Status is: inpatient  The patient will require care spanning > 2 midnights and should be moved to inpatient because: Inpatient level of care appropriate due to severity of illness  Dispo: The patient is from: Home              Anticipated d/c is to: Home              Patient currently is not medically stable to d/c.  CLIPP pending.   Difficult to place patient No  Estimated body mass index is 26.93 kg/m as calculated from the following:   Height as of this encounter: '5\' 7"'$  (1.702 m).   Weight as of this encounter: 78 kg.   Nutritional Assessment: Body mass index is 26.93 kg/m.Marland Kitchen Seen by dietician.  I agree with the assessment and plan as outlined below: Nutrition Status:   Skin Assessment: I have examined the patient's skin and I agree with the wound assessment as performed by the wound care RN as outlined below:    Consultants:  Nephrology  Procedures:  None  Antimicrobials:  Anti-infectives (From admission, onward)    Start     Dose/Rate Route Frequency Ordered Stop   02/20/21 1345  vancomycin (VANCOCIN) IVPB 1000 mg/200 mL premix  Status:  Discontinued        1,000 mg 200 mL/hr over 60 Minutes Intravenous To Surgery 02/20/21 1335 02/20/21 1742          Subjective:  Patient denies any complaints today. Objective: Vitals:   02/25/21 0600 02/25/21 0832 02/25/21 0900 02/25/21 1200  BP: (!) 158/71 (!) 143/66 (!) 111/53 138/63  Pulse: 73 77 71 73  Resp:  13  15  Temp:  98.7 F (37.1 C)  98.6 F (37 C)  TempSrc:  Oral  Oral  SpO2: 100% 95%  99%  Weight:      Height:       No intake or output data in the 24 hours ending 02/25/21 1313  Filed Weights   02/22/21 0948 02/24/21 0833 02/24/21 1208  Weight: 77.5 kg 80 kg 78 kg     Examination:  Awake Alert, Oriented X 3, No new F.N deficits, Normal affect Symmetrical Chest wall movement, Good air movement bilaterally, CTAB RRR,No Gallops,Rubs or new Murmurs, No Parasternal Heave +ve B.Sounds, Abd Soft, No tenderness, No rebound - guarding or rigidity. No Cyanosis, Clubbing or edema, No new Rash or bruise  , right upper extremity swelling   Data Reviewed: I have personally reviewed following labs and imaging studies  CBC: Recent Labs  Lab 02/19/21 0018 02/21/21 0114 02/22/21 0109 02/22/21 1018 02/23/21 1135 02/24/21 0825  WBC 11.0* 8.5 7.9 8.3 9.1 12.0*  NEUTROABS 8.3* 6.4 5.5 6.0  --   --   HGB 8.2* 8.2* 7.9* 7.2* 7.9* 7.2*  HCT 27.0* 26.1* 25.1* 23.6* 25.6* 23.2*  MCV 87.9 84.7 83.9 86.1 86.8 86.9  PLT 276 328 308  300 328 [id number]   Basic Metabolic Panel: Recent Labs  Lab 02/19/21 0018 02/20/21 0946 02/21/21 0114 02/22/21 1018 02/24/21 0825  NA 133* 136  --  134* 135  K 4.1 3.5  --  3.8 3.9  CL 107 105  --  100 101  CO2 17* 21*  --  25 27  GLUCOSE 102* 91  --  127* 141*  BUN 59* 60*  --  43* 36*  CREATININE 6.38* 6.92*  --  6.29* 6.54*  CALCIUM 6.2* 7.1* 6.9* 7.1* 7.7*  PHOS  --   --   --  4.8* 5.0*   GFR: Estimated Creatinine Clearance: 13.1 mL/min (A) (by C-G formula based on SCr of 6.54 mg/dL (H)). Liver Function Tests: Recent Labs  Lab 02/22/21 1018 02/24/21 0825  ALBUMIN 1.5* 1.5*   No results for input(s): LIPASE, AMYLASE in the last 168 hours. No results for input(s): AMMONIA in the last 168 hours. Coagulation Profile: No results for input(s): INR, PROTIME in the last 168 hours. Cardiac Enzymes: No results for input(s): CKTOTAL, CKMB, CKMBINDEX, TROPONINI in the last 168 hours. BNP (last 3 results) No results for input(s): PROBNP in the last 8760 hours. HbA1C: No results for input(s): HGBA1C in the last 72 hours.  CBG: Recent Labs  Lab 02/24/21 1559 02/24/21 1827 02/24/21 2115 02/25/21 0830 02/25/21 1259  GLUCAP  142* 152* 230* 201* 106*   Lipid Profile: No results for input(s): CHOL, HDL, LDLCALC, TRIG, CHOLHDL, LDLDIRECT in the last 72 hours.  Thyroid Function Tests: No results for input(s): TSH, T4TOTAL, FREET4, T3FREE, THYROIDAB in the last 72 hours. Anemia Panel: Recent Labs    02/23/21 1135  VITAMINB12 481  FOLATE 10.9  FERRITIN 526*  TIBC 227*  IRON 28*  RETICCTPCT 2.2    Sepsis Labs: No results for input(s): PROCALCITON, LATICACIDVEN in the last 168 hours.  Recent Results (from the past 240 hour(s))  Resp Panel by RT-PCR (Flu A&B, Covid) Nasopharyngeal Swab     Status: None   Collection Time: 02/17/21  4:45 PM   Specimen: Nasopharyngeal Swab; Nasopharyngeal(NP) swabs in vial transport medium  Result Value Ref Range Status   SARS Coronavirus 2 by RT PCR NEGATIVE NEGATIVE Final    Comment: (NOTE) SARS-CoV-2 target nucleic acids are NOT DETECTED.  The SARS-CoV-2 RNA is generally detectable in upper respiratory specimens during the acute phase of infection. The lowest concentration of SARS-CoV-2 viral copies this assay can detect is 138 copies/mL. A negative result does not preclude SARS-Cov-2 infection and should not be used as the sole basis for treatment or other patient management decisions. A negative result may occur with  improper specimen collection/handling, submission of specimen other than nasopharyngeal swab, presence of viral mutation(s) within the areas targeted by this assay, and inadequate number of viral copies(<138 copies/mL). A negative result must be combined with clinical observations, patient history, and epidemiological information. The expected result is Negative.  Fact Sheet for Patients:  EntrepreneurPulse.com.au  Fact Sheet for Healthcare Providers:  IncredibleEmployment.be  This test is no t yet approved or cleared by the [place] FDA and  has been authorized for detection and/or diagnosis of SARS-CoV-2  by FDA under an Emergency Use Authorization (EUA). This EUA will remain  in effect (meaning this test can be used) for the duration of the COVID-19 declaration under Section 564(b)(1) of the Act, 21 U.S.C.section 360bbb-3(b)(1), unless the authorization is terminated  or revoked sooner.       Influenza A by PCR NEGATIVE NEGATIVE  Final   Influenza B by PCR NEGATIVE NEGATIVE Final    Comment: (NOTE) The Xpert Xpress SARS-CoV-2/FLU/RSV plus assay is intended as an aid in the diagnosis of influenza from Nasopharyngeal swab specimens and should not be used as a sole basis for treatment. Nasal washings and aspirates are unacceptable for Xpert Xpress SARS-CoV-2/FLU/RSV testing.  Fact Sheet for Patients: EntrepreneurPulse.com.au  Fact Sheet for Healthcare Providers: IncredibleEmployment.be  This test is not yet approved or cleared by the [place] FDA and has been authorized for detection and/or diagnosis of SARS-CoV-2 by FDA under an Emergency Use Authorization (EUA). This EUA will remain in effect (meaning this test can be used) for the duration of the COVID-19 declaration under Section 564(b)(1) of the Act, 21 U.S.C. section 360bbb-3(b)(1), unless the authorization is terminated or revoked.  Performed at [facility], [place] [address] [place], [place] [address]   MRSA Next Gen by PCR, Nasal     Status: None   Collection Time: 02/17/21 10:09 PM   Specimen: Nasal Mucosa; Nasal Swab  Result Value Ref Range Status   MRSA by PCR Next Gen NOT DETECTED NOT DETECTED Final    Comment: (NOTE) The GeneXpert MRSA Assay (FDA approved for NASAL specimens only), is one component of a comprehensive MRSA colonization surveillance program. It is not intended to diagnose MRSA infection nor to guide or monitor treatment for MRSA infections. Test performance is not FDA approved in patients less than 56 years old. Performed at [facility],  [place] [address] [place], [place] [address]   Culture, blood (routine x 2)     Status: None   Collection Time: 02/18/21 12:24 AM   Specimen: BLOOD  Result Value Ref Range Status   Specimen Description BLOOD RIGHT ANTECUBITAL  Final   Special Requests   Final    BOTTLES DRAWN AEROBIC AND ANAEROBIC Blood Culture adequate volume   Culture   Final    NO GROWTH 5 DAYS Performed at [facility], [place] [address], [place], [place] [address]    Report Status 02/23/2021 FINAL  Final  Culture, blood (routine x 2)     Status: Abnormal   Collection Time: 02/18/21 12:24 AM   Specimen: BLOOD RIGHT HAND  Result Value Ref Range Status   Specimen Description BLOOD RIGHT HAND  Final   Special Requests   Final    BOTTLES DRAWN AEROBIC AND ANAEROBIC Blood Culture adequate volume   Culture  Setup Time   Final    GRAM POSITIVE COCCI AEROBIC BOTTLE ONLY CRITICAL RESULT CALLED TO, READ BACK BY AND VERIFIED WITH: L,CHEN PHARMD '@1840'$  02/18/21 EB    Culture (A)  Final    STAPHYLOCOCCUS HOMINIS THE SIGNIFICANCE OF ISOLATING THIS ORGANISM FROM A SINGLE SET OF BLOOD CULTURES WHEN MULTIPLE SETS ARE DRAWN IS UNCERTAIN. PLEASE NOTIFY THE MICROBIOLOGY DEPARTMENT WITHIN ONE WEEK IF SPECIATION AND SENSITIVITIES ARE REQUIRED. Performed at [facility], [place] [address] [place], [place] [address]    Report Status 02/19/2021 FINAL  Final  Blood Culture ID Panel (Reflexed)     Status: Abnormal   Collection Time: 02/18/21 12:24 AM  Result Value Ref Range Status   Enterococcus faecalis NOT DETECTED NOT DETECTED Final   Enterococcus Faecium NOT DETECTED NOT DETECTED Final   Listeria monocytogenes NOT DETECTED NOT DETECTED Final   Staphylococcus species DETECTED (A) NOT DETECTED Final    Comment: CRITICAL RESULT CALLED TO, READ BACK BY AND VERIFIED WITH: L,CHEN PHARMD '@1840'$  02/18/21 EB  Staphylococcus aureus (BCID) NOT DETECTED NOT DETECTED Final   Staphylococcus epidermidis NOT DETECTED NOT DETECTED Final    Staphylococcus lugdunensis NOT DETECTED NOT DETECTED Final   Streptococcus species NOT DETECTED NOT DETECTED Final   Streptococcus agalactiae NOT DETECTED NOT DETECTED Final   Streptococcus pneumoniae NOT DETECTED NOT DETECTED Final   Streptococcus pyogenes NOT DETECTED NOT DETECTED Final   A.calcoaceticus-baumannii NOT DETECTED NOT DETECTED Final   Bacteroides fragilis NOT DETECTED NOT DETECTED Final   Enterobacterales NOT DETECTED NOT DETECTED Final   Enterobacter cloacae complex NOT DETECTED NOT DETECTED Final   Escherichia coli NOT DETECTED NOT DETECTED Final   Klebsiella aerogenes NOT DETECTED NOT DETECTED Final   Klebsiella oxytoca NOT DETECTED NOT DETECTED Final   Klebsiella pneumoniae NOT DETECTED NOT DETECTED Final   Proteus species NOT DETECTED NOT DETECTED Final   Salmonella species NOT DETECTED NOT DETECTED Final   Serratia marcescens NOT DETECTED NOT DETECTED Final   Haemophilus influenzae NOT DETECTED NOT DETECTED Final   Neisseria meningitidis NOT DETECTED NOT DETECTED Final   Pseudomonas aeruginosa NOT DETECTED NOT DETECTED Final   Stenotrophomonas maltophilia NOT DETECTED NOT DETECTED Final   Candida albicans NOT DETECTED NOT DETECTED Final   Candida auris NOT DETECTED NOT DETECTED Final   Candida glabrata NOT DETECTED NOT DETECTED Final   Candida krusei NOT DETECTED NOT DETECTED Final   Candida parapsilosis NOT DETECTED NOT DETECTED Final   Candida tropicalis NOT DETECTED NOT DETECTED Final   Cryptococcus neoformans/gattii NOT DETECTED NOT DETECTED Final    Comment: Performed at [facility], [address], [place], [place] [address]       Radiology Studies: No results found.  Scheduled Meds:  amLODipine  10 mg Oral Daily   aspirin EC  81 mg Oral Daily   atorvastatin  40 mg Oral Daily   calcitRIOL  0.25 mcg Oral Daily   carvedilol  12.5 mg Oral BID WC   Chlorhexidine Gluconate Cloth  6 each Topical Q0600   darbepoetin (ARANESP) injection - DIALYSIS   60 mcg Intravenous Q Sat-HD   heparin  5,000 Units Subcutaneous Q8H   hydrALAZINE  25 mg Oral TID   insulin aspart  0-9 Units Subcutaneous TID WC   insulin detemir  20 Units Subcutaneous Daily   nitroGLYCERIN  0.4 mg Transdermal Daily   sodium chloride flush  3 mL Intravenous Q12H   Continuous Infusions:  sodium chloride 10 mL/hr at 02/20/21 1159   sodium chloride     sodium chloride     sodium chloride       LOS: 7 days      Phillips Climes, MD [facility]  02/25/2021, 1:13 PM  Please page via [place] [place] and do not message via secure chat for anything urgent. Secure chat can be used for anything non urgent.  How to contact the [facility] Attending or Consulting provider [place] or covering provider during after hours [place], for this patient?  Check the care team in [facility] and look for a) attending/consulting TR[facility] provider listed and b) the [facility] team listed. Page or secure chat [phone]. Log into [email or web address] and use [place]'s universal password to access. If you do not have the password, please contact the hospital operator. Locate the [facility] provider you are looking for under [facility] and page to a number that you can be directly reached. If you still have difficulty reaching the provider, please page the [facility] (Director on Call) for the Hospitalists listed on amion  for assistance.

## 2021-02-25 NOTE — Progress Notes (Signed)
[facility] Progress Note   Subjective:   Overall feeing ok.  3rd HD yesterday with UF 2.5L tolerated well.  D/c fell through due to transportation issues to/from HD - SW working on it.  Objective Vitals:   02/24/21 2000 02/25/21 0000 02/25/21 0400 02/25/21 0600  BP: (!) 153/60 (!) 137/55 (!) 159/70 (!) 158/71  Pulse:    73  Resp: 19 16 (!) 22   Temp: 98.1 F (36.7 C) 98.4 F (36.9 C) 98.3 F (36.8 C)   TempSrc: Oral Oral Oral   SpO2: 100% 97% 100% 100%  Weight:      Height:       Physical Exam General: nontoxic lying in bed on HD Heart: RRR Lungs: clear, normal WOB Abdomen: soft, nontender Extremities: trace LE edema Dialysis Access: RIJ TDC, RUE AVG +t/b, 2+ radial pulse and hand warm but c/o some pain stable - RUE has 1+ pitting edema now that is stable c/w yesterday  Additional Objective Labs: Basic Metabolic Panel: Recent Labs  Lab 02/20/21 0946 02/21/21 0114 02/22/21 1018 02/24/21 0825  NA 136  --  134* 135  K 3.5  --  3.8 3.9  CL 105  --  100 101  CO2 21*  --  25 27  GLUCOSE 91  --  127* 141*  BUN 60*  --  43* 36*  CREATININE 6.92*  --  6.29* 6.54*  CALCIUM 7.1* 6.9* 7.1* 7.7*  PHOS  --   --  4.8* 5.0*    Liver Function Tests: Recent Labs  Lab 02/22/21 1018 02/24/21 0825  ALBUMIN 1.5* 1.5*    No results for input(s): LIPASE, AMYLASE in the last 168 hours. CBC: Recent Labs  Lab 02/21/21 0114 02/22/21 0109 02/22/21 1018 02/23/21 1135 02/24/21 0825  WBC 8.5 7.9 8.3 9.1 12.0*  NEUTROABS 6.4 5.5 6.0  --   --   HGB 8.2* 7.9* 7.2* 7.9* 7.2*  HCT 26.1* 25.1* 23.6* 25.6* 23.2*  MCV 84.7 83.9 86.1 86.8 86.9  PLT 328 308 300 328 319    Blood Culture    Component Value Date/Time   SDES BLOOD RIGHT ANTECUBITAL 02/18/2021 0024   SDES BLOOD RIGHT HAND 02/18/2021 0024   SPECREQUEST  02/18/2021 0024    BOTTLES DRAWN AEROBIC AND ANAEROBIC Blood Culture adequate volume   SPECREQUEST  02/18/2021 0024    BOTTLES DRAWN AEROBIC AND  ANAEROBIC Blood Culture adequate volume   CULT  02/18/2021 0024    NO GROWTH 5 DAYS Performed at [facility], [place] [address] [place], [place] [address]    CULT (A) 02/18/2021 0024    STAPHYLOCOCCUS HOMINIS THE SIGNIFICANCE OF ISOLATING THIS ORGANISM FROM A SINGLE SET OF BLOOD CULTURES WHEN MULTIPLE SETS ARE DRAWN IS UNCERTAIN. PLEASE NOTIFY THE MICROBIOLOGY DEPARTMENT WITHIN ONE WEEK IF SPECIATION AND SENSITIVITIES ARE REQUIRED. Performed at [facility], [place] [address], [place], [place] [address]    REPTSTATUS 02/23/2021 FINAL 02/18/2021 0024   REPTSTATUS 02/19/2021 FINAL 02/18/2021 0024    Cardiac Enzymes: No results for input(s): CKTOTAL, CKMB, CKMBINDEX, TROPONINI in the last 168 hours. CBG: Recent Labs  Lab 02/24/21 0757 02/24/21 1304 02/24/21 1559 02/24/21 1827 02/24/21 2115  GLUCAP 134* 103* 142* 152* 230*    Iron Studies:  Recent Labs    02/23/21 1135  IRON 28*  TIBC 227*  FERRITIN 526*    '@lablastinr3'$ @ Studies/Results: No results found. Medications:  sodium chloride 10 mL/hr at 02/20/21 1159   sodium chloride  sodium chloride     sodium chloride      amLODipine  10 mg Oral Daily   aspirin EC  81 mg Oral Daily   atorvastatin  40 mg Oral Daily   calcitRIOL  0.25 mcg Oral Daily   carvedilol  12.5 mg Oral BID WC   Chlorhexidine Gluconate Cloth  6 each Topical Q0600   darbepoetin (ARANESP) injection - DIALYSIS  60 mcg Intravenous Q Sat-HD   heparin  5,000 Units Subcutaneous Q8H   hydrALAZINE  25 mg Oral TID   insulin aspart  0-9 Units Subcutaneous TID WC   insulin detemir  20 Units Subcutaneous Daily   nitroGLYCERIN  0.4 mg Transdermal Daily   sodium chloride flush  3 mL Intravenous Q12H     Assessment/Recommendations: [name] is a/an 50 y.o. male with a past medical history HTN, DM2, HLD, HFrEF (EF 30 to 35% in July 2022), CKD who present w/ chest pain and progressive CKD   CKD V now with uremic symptoms > progressed to ESRD:    Having low grade uremic symptoms now and on history of poor adherence/follow up he's agreeable to starting dialysis while in hospital.   -s/p [facility] and AVG 10/4, Dr. Scot Dock --> has some mild stable steal symptoms with great radial pulse; now with some mild arm edema which can be monitored outpt -HD #1 10/5, plan #2 10/6  and #3 10/8; next Tues planned outpt -CLIP to outpt unit, navigator aware -- ok for d/c with next HD at [facility] but needs to go Monday 10/10 3pm for paperwork; SW arranging transportation -Dose meds for ESRD -Monitor Daily I/Os, Daily weight  -Maintain MAP>65 for optimal renal perfusion.  -Avoid nephrotoxic medications including NSAIDs and Vanc/Zosyn combo  Secondary Hyperparathyroidism:  PTH 78, phos ok, calcitriol for hypoca, use 2.5ca dialysate for now - corr improving   HFrEF: History of heart failure with EF of 30 to 35% in July 2022.  -Hypertension management as below -Volume mgmt with HD - doesn't make enough urine to justify daily diuretic.   Hypertension: Currently on amlodipine and coreg with reasonable BPs.    Anemia due to CKD: Hemoglobin 10.4 > 8.2>7.9>7.2.  No gross bleeding noted.  Iron sat 8 and ferritin 68. IV iron '250mg'$  daily x4 doses. ESA with HD. Continue to monitor and transfuse < 7.    Uncontrolled Diabetes Mellitus Type 2 with Hyperglycemia: Management per primary   Metabolic acidosis: now resolved with HD  D/c today if HD transport arranged.   [name] MD 02/25/2021, 6:48 AM  [facility] Pager: [phone]

## 2021-02-26 ENCOUNTER — Other Ambulatory Visit (HOSPITAL_COMMUNITY): Payer: Self-pay

## 2021-02-26 LAB — BASIC METABOLIC PANEL
Anion gap: 9 (ref 5–15)
BUN: 28 mg/dL — ABNORMAL HIGH (ref 6–20)
CO2: 27 mmol/L (ref 22–32)
Calcium: 7.8 mg/dL — ABNORMAL LOW (ref 8.9–10.3)
Chloride: 101 mmol/L (ref 98–111)
Creatinine, Ser: 6.17 mg/dL — ABNORMAL HIGH (ref 0.61–1.24)
GFR, Estimated: 11 mL/min — ABNORMAL LOW (ref >60)
Glucose, Bld: 110 mg/dL — ABNORMAL HIGH (ref 70–99)
Potassium: 3.9 mmol/L (ref 3.5–5.1)
Sodium: 137 mmol/L (ref 135–145)

## 2021-02-26 LAB — CBC
HCT: 23.8 % — ABNORMAL LOW (ref 39.0–52.0)
Hemoglobin: 7.4 g/dL — ABNORMAL LOW (ref 13.0–17.0)
MCH: 26.8 pg (ref 26.0–34.0)
MCHC: 31.1 g/dL (ref 30.0–36.0)
MCV: 86.2 fL (ref 80.0–100.0)
Platelets: 369 10*3/uL (ref 150–400)
RBC: 2.76 MIL/uL — ABNORMAL LOW (ref 4.22–5.81)
RDW: 13.6 % (ref 11.5–15.5)
WBC: 10.5 10*3/uL (ref 4.0–10.5)
nRBC: 0 % (ref 0.0–0.2)

## 2021-02-26 LAB — GLUCOSE, CAPILLARY
Glucose-Capillary: 97 mg/dL (ref 70–99)
Glucose-Capillary: 116 mg/dL — ABNORMAL HIGH (ref 70–99)

## 2021-02-26 MED ORDER — ATORVASTATIN CALCIUM 40 MG PO TABS
40.0000 mg | ORAL_TABLET | Freq: Every day | ORAL | 0 refills | Status: DC
  Filled 2021-02-26: qty 30, 30d supply, fill #0

## 2021-02-26 MED ORDER — CALCITRIOL 0.25 MCG PO CAPS
0.2500 ug | ORAL_CAPSULE | Freq: Every day | ORAL | 0 refills | Status: DC
  Filled 2021-02-26: qty 30, 30d supply, fill #0

## 2021-02-26 MED ORDER — AMLODIPINE BESYLATE 10 MG PO TABS
10.0000 mg | ORAL_TABLET | Freq: Every day | ORAL | 0 refills | Status: DC
  Filled 2021-02-26: qty 30, 30d supply, fill #0

## 2021-02-26 MED ORDER — HYDRALAZINE HCL 25 MG PO TABS
25.0000 mg | ORAL_TABLET | Freq: Three times a day (TID) | ORAL | 0 refills | Status: DC
  Filled 2021-02-26: qty 90, 30d supply, fill #0

## 2021-02-26 MED ORDER — INSULIN GLARGINE 100 UNIT/ML SOLOSTAR PEN
25.0000 [IU] | PEN_INJECTOR | Freq: Every day | SUBCUTANEOUS | 0 refills | Status: DC
  Filled 2021-02-26: qty 9, 30d supply, fill #0

## 2021-02-26 MED ORDER — CARVEDILOL 12.5 MG PO TABS
12.5000 mg | ORAL_TABLET | Freq: Two times a day (BID) | ORAL | 0 refills | Status: DC
  Filled 2021-02-26: qty 60, 30d supply, fill #0

## 2021-02-26 MED ORDER — NOVOLOG FLEXPEN 100 UNIT/ML ~~LOC~~ SOPN
5.0000 [IU] | PEN_INJECTOR | Freq: Two times a day (BID) | SUBCUTANEOUS | 2 refills | Status: DC
  Filled 2021-02-26: qty 3, 30d supply, fill #0
  Filled 2021-08-02: qty 9, 84d supply, fill #1

## 2021-02-26 MED ORDER — INSULIN PEN NEEDLE 32G X 4 MM MISC
1 refills | Status: DC
  Filled 2021-02-26: qty 100, 30d supply, fill #0
  Filled 2021-08-02: qty 100, 30d supply, fill #1

## 2021-02-26 NOTE — Progress Notes (Addendum)
Met with pt at bedside this am. Pt states that brother is unable to assist with transport to/from HD at d/c. Pt advised that he needs to arrive at clinic tomorrow am at 10:45 to complete paperwork prior to treatment. Clinic aware that we are planning for pt to start in the am. Pt states he has a friend that can transport him to/from HD tomorrow and Saturday but does not have transport for Thursday. CSW aware of transportation need and provided update regarding the above. Will follow and assist.   Melven Sartorius Renal Navigator [phone]  Addendum at 4:42 pm: CSW arranged transport with [facility] and pt d/c this afternoon. Spoke to [employer], at clinic to confirm pt's d/c and pt to start tomorrow. Contacted PA for orders.

## 2021-02-26 NOTE — TOC Progression Note (Addendum)
Transition of Care [facility]) - Progression Note    Patient Details  Name: [name] MRN: [id number] Date of Birth: Jul 30, 1970  Transition of Care [facility]) CM/SW Contact  Joanne Chars, LCSW Phone Number: 02/26/2021, 3:04 PM  Clinical Narrative:   CSW contacted [facility] and determined that pt is in their service area.  Application filled out, MD portion filled out.  1100:  emailed to [employer] at [employer], asked her to call back due to not sure on several of the questions.    1300: Received email from [facility] that application not complete, filled out as best as could, resent.  Voicemail from [place] that she will be in a meeting until 230pm.    1500: attempt to call [place] Sous[place], also emailed  1530: Terence Lux, process completed, she has application and set pt up in system, they can transport tomorrow.  1545: CSW and pt went over the arrangements, called reservation line and set up rides for TTS this week.  Wrote down pick up times for pt and provided all the [facility] application paperwork.  Pt verbalized understanding.     Expected Discharge Plan: Home/Self Care Barriers to Discharge: No Barriers Identified  Expected Discharge Plan and Services Expected Discharge Plan: Home/Self Care   Discharge Planning Services: CM Consult Post Acute Care Choice: NA Living arrangements for the past 2 months: Single Family Home Expected Discharge Date: 02/26/21               DME Arranged: Oxygen DME Agency: [employer] Date DME Agency Contacted: 02/24/21 Time DME Agency Contacted: [id number] Representative spoke with at DME Agency: [place]: NA           Social Determinants of Health ([place]) Interventions    Readmission Risk Interventions Readmission Risk Prevention Plan 02/19/2021  Transportation Screening Complete  Medication Review Press photographer) Complete  PCP or Specialist appointment within 3-5 days of discharge Complete  HRI or [place] Complete  SW Recovery Care/Counseling Consult Complete  [place] Not Applicable  Some recent data might be hidden

## 2021-02-26 NOTE — TOC Transition Note (Addendum)
Transition of Care [facility]) - CM/SW Discharge Note   Patient Details  Name: MANUAL BROAS MRN: [id number] Date of Birth: [birth year]  Transition of Care [facility]) CM/SW Contact:  Joanne Chars, LCSW Phone Number: 02/26/2021, 3:55 PM   Clinical Narrative: Pt discharging home to self care.  Home O2 provided by [employer].  CSW confirmed with [employer] that they will deliver [place] set up today.  HD in place as is transportation with [facility].  Pt has ride home today from family member.  No other needs identified.      10/11: Email from Saprese Jones/Financial Counseling.  Pt was referred to med assist on 10/3 for disability review.  No one has yet been assigned.     Final next level of care: Home/Self Care Barriers to Discharge: Barriers Resolved   Patient Goals and CMS Choice Patient states their goals for this hospitalization and ongoing recovery are:: return home   Choice offered to / list presented to : NA  Discharge Placement                       Discharge Plan and Services   Discharge Planning Services: CM Consult Post Acute Care Choice: NA          DME Arranged: Oxygen DME Agency: [facility] Date DME Agency Contacted: 02/24/21 Time DME Agency Contacted: [id number] Representative spoke with at DME Agency: [place]: NA          Social Determinants of Health ([place]) Interventions     Readmission Risk Interventions Readmission Risk Prevention Plan 02/19/2021  Transportation Screening Complete  Medication Review Press photographer) Complete  PCP or Specialist appointment within 3-5 days of discharge Complete  HRI or [place] Complete  SW Recovery Care/Counseling Consult Complete  [place] Not Applicable  Some recent data might be hidden

## 2021-02-26 NOTE — Progress Notes (Signed)
Patient ID: [name], male   DOB: 02/08/1971, 50 y.o.   MRN: [id number] [facility] Progress Note   Assessment/ Plan:   1.  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  He has been placed to [facility] hemodialysis unit on a TTS schedule and social work assisting with transportation set up.  He does not have any acute indications for hemodialysis at this time and will be scheduled for dialysis again tomorrow if still here otherwise is aware to go to the kidney center today or earlier tomorrow to sign paperwork prior to his first treatment there.  He is status post right IJ TDC with right upper arm AV graft placement with some associated arm swelling and mild steal symptoms. 2. Anemia: Without overt blood loss, continue ESA with hemodialysis along with IV iron. 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 4. Nutrition: On renal diet with fluid restriction and renal multivitamin.  Encourage lean protein intake. 5. Hypertension: Blood pressure intermittently elevated, monitor with ultrafiltration at hemodialysis and ongoing antihypertensive therapy.  Subjective:   Without any acute events overnight, denies any chest pain or shortness of breath.   Objective:   BP (!) 165/70 (BP Location: Left Arm)   Pulse 76   Temp 98 F (36.7 C) (Oral)   Resp 16   Ht '5\' 7"'$  (1.702 m)   Wt 78 kg   SpO2 100%   BMI 26.93 kg/m   Physical Exam: Gen: Appears uncomfortable with minimal emotions resting in bed CVS: Pulse regular rhythm, normal rate, S1 and S2 normal Resp: Clear to auscultation bilaterally, no rales/rhonchi, right IJ TDC in place Abd: Soft, flat, nontender, bowel sounds normal Ext: Trace to 1+ lower extremity edema.  Right upper arm swelling noted with ipsilateral right upper arm graft with audible bruit  Labs: BMET Recent Labs  Lab 02/20/21 0946  02/21/21 0114 02/22/21 1018 02/24/21 0825 02/26/21 0149  NA 136  --  134* 135 137  K 3.5  --  3.8 3.9 3.9  CL 105  --  100 101 101  CO2 21*  --  '25 27 27  '$ GLUCOSE 91  --  127* 141* 110*  BUN 60*  --  43* 36* 28*  CREATININE 6.92*  --  6.29* 6.54* 6.17*  CALCIUM 7.1* 6.9* 7.1* 7.7* 7.8*  PHOS  --   --  4.8* 5.0*  --    CBC Recent Labs  Lab 02/21/21 0114 02/22/21 0109 02/22/21 1018 02/23/21 1135 02/24/21 0825 02/26/21 0149  WBC 8.5 7.9 8.3 9.1 12.0* 10.5  NEUTROABS 6.4 5.5 6.0  --   --   --   HGB 8.2* 7.9* 7.2* 7.9* 7.2* 7.4*  HCT 26.1* 25.1* 23.6* 25.6* 23.2* 23.8*  MCV 84.7 83.9 86.1 86.8 86.9 86.2  PLT 328 308 300 328 319 369     Medications:     amLODipine  10 mg Oral Daily   aspirin EC  81 mg Oral Daily   atorvastatin  40 mg Oral Daily   calcitRIOL  0.25 mcg Oral Daily   carvedilol  12.5 mg Oral BID WC   Chlorhexidine Gluconate Cloth  6 each Topical Q0600   darbepoetin (ARANESP) injection - DIALYSIS  60 mcg Intravenous Q Sat-HD   heparin  5,000 Units Subcutaneous Q8H   hydrALAZINE  25 mg Oral TID   insulin aspart  0-9 Units Subcutaneous TID  WC   insulin detemir  20 Units Subcutaneous Daily   nitroGLYCERIN  0.4 mg Transdermal Daily   sodium chloride flush  3 mL Intravenous Q12H   Elmarie Shiley, MD 02/26/2021, 8:02 AM

## 2021-02-26 NOTE — Discharge Summary (Signed)
Physician Discharge Summary  [name] DJM:[id number] DOB: Feb 07, 1971 DOA: 02/17/2021  PCP: Vevelyn Francois, NP  Admit date: 02/17/2021 Discharge date: 02/26/2021  Admitted From: Home, Disposition:  Home   Recommendations for Outpatient Follow-up:  Follow up with PCP in 1-2 weeks Please obtain BMP/CBC in one week To continue her hemodialysis on TTS schedule. Patient will need referral to follow-up with ophthalmology as an outpatient given he is diabetic Adjust blood pressure medications as needed Home Health:NO Equipment/Devices:oxygen 2L  Discharge Condition:Stable CODE STATUS:FULL Diet recommendation: Renal / Carb Modified   Brief/Interim Summary:  [name] is a 50 y.o. male with medical history significant of HTN, DM, HLD, chronic systolic CHF, medical noncompliance presented to ED with chest pain that started a couple of weeks ago and has been intermittent in nature. Pain located over left lateral chest wall, under left breast, sharp 9 out of 10 with no radiation and no associated diaphoresis or exacerbating or relieving factors.  Had associated shortness of breath intermittently.  Also endorses orthopnea and uses 2 pillows to sleep.  Has lower extremity edema bilateral which started 3 to 4 months ago.  No fever, chills or any other complaint.  Patient is a poor historian.  Upon arrival to ED, he was afebrile with blood pressure of 202/115.  Otherwise saturating 100% on room air.  Troponin slightly elevated but flat. Chest x-ray shows stable cardiomegaly and chronic diffuse pulmonary vascular congestion.  No evidence of frank pulmonary edema. In ED given 60 mg IV Lasix, 100 mg hydralazine tablet, and 20 mg labetalol injection.  TRH was called and asked to admit.  Due to advanced and progressive CKD, nephrology was consulted and they recommended initiating dialysis while inpatient.  Vascular surgery did TDC and left IJ and placement of new right forearm AV graft on 02/20/2021.   Patient started on dialysis on 02/21/2021.    CKD stage V, with uremic symptoms, now ESRD -Progressed to ESRD -Management per renal, started on hemodialysis, per renal "ok for d/c with next HD at [facility] but needs to go Monday 10/10 3pm for paperwork" - S/p [facility] and AVG on 02/20/2021 by Dr. Scot Dock   Chest pain:  - Currently chest pain-free.  No acute ST-T wave changes on EKG.  Troponin elevated but flat and in fact not too far away from his baseline creatinine since it has been elevated since last 2 to 3 years likely due to demand ischemia in combination of advanced CKD.   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  No need to repeat echo.  - Volume management with HD. -He is hypoxic on room air, requiring 2 L nasal cannula on discharge.   Fever/leukocytosis:  -He developed fever of 100.4 and also developed leukocytosis of 17,000 on 02/18/2021.  Only one of the 4 blood culture sets is growing staph aureus/hominis, no MRSA.  Patient has remained afebrile since then.  Leukocytosis also improving.  UA negative and chest x-ray negative for infection.  No other indication of infection anywhere else.  Likely contaminant.  Continue to monitor off antibiotics for now.   Hypertensive urgency: Presented with blood pressure of 202/115.  History of noncompliance, he was resumed on his home medications, and now he is on dialysis hopefully blood pressure will continue to improve .  Poorly controlled type 2 diabetes mellitus  with hyperglycemia: Hemoglobin A1c 8.8.  He is supposed to be taking Lantus 35 units at home.  Currently on 20 units of Lantus and SSI and blood sugar controlled.  Apparently patient out of insulin has not been taking any for some time now, he will be discharged on lower dose Lantus at  25 units.   Hyperlipidemia: Continue statin.     Anemia of chronic disease: -Received IV iron during hospital stay.   Chronic visual deficit: Its been going on since 6 months according to H&P.  Patient did not mention anything to me about his vision.  Likely diabetic retinopathy in the face of being noncompliant.  Will need ophthalmology as outpatient.   Long history of noncompliance: Per nephrologist, patient has missed his several appointments with nephrology.  Counseling provided.    Discharge Diagnoses:  Active Problems:   Hyperlipidemia LDL goal <70   Stage 4 chronic kidney disease ([place])   Hypertensive crisis   Acute combined systolic and diastolic congestive heart failure (HCC)   Type 2 diabetes mellitus ([place])   Hypertensive urgency    Discharge Instructions  Discharge Instructions     Discharge instructions   Complete by: As directed    Follow with Primary MD Vevelyn Francois, NP in 7 days   Get CBC, CMP, 2 view Chest X ray checked  by Primary MD next visit.    Activity: As tolerated with Full fall precautions use walker/cane & assistance as needed   Disposition Home    Diet: Renal/carb modified , with feeding assistance and aspiration precautions.  For Heart failure patients - Check your Weight same time everyday, if you gain over 2 pounds, or you develop in leg swelling, experience more shortness of breath or chest pain, call your Primary MD immediately. Follow Cardiac Low Salt Diet and 1.5 lit/day fluid restriction.   On your next visit with your primary care physician please Get Medicines reviewed and adjusted.   Please request your Prim.MD to go over all Hospital Tests and Procedure/Radiological results at the follow up, please get all Hospital records sent to your Prim MD by signing hospital release before you go home.   If you experience worsening of your admission symptoms, develop shortness of breath, life threatening emergency, suicidal or homicidal  thoughts you must seek medical attention immediately by calling 911 or calling your MD immediately  if symptoms less severe.  You Must read complete instructions/literature along with all the possible adverse reactions/side effects for all the Medicines you take and that have been prescribed to you. Take any new Medicines after you have completely understood and accpet all the possible adverse reactions/side effects.   Do not drive, operating heavy machinery, perform activities at heights, swimming or participation in water activities or provide baby sitting services if your were admitted for syncope or siezures until you have seen by Primary MD or a Neurologist and advised to do so again.  Do not drive when taking Pain medications.    Do not take more than prescribed Pain, Sleep and Anxiety Medications  Special Instructions: If you have smoked or chewed Tobacco  in the last 2 yrs please stop smoking, stop any regular Alcohol  and or any Recreational drug use.  Wear Seat belts while driving.   Please note  You were cared for by a hospitalist during your hospital stay. If you have any questions about your discharge medications or the care you received while you were in the hospital after you are discharged,  you can call the unit and asked to speak with the hospitalist on call if the hospitalist that took care of you is not available. Once you are discharged, your primary care physician will handle any further medical issues. Please note that NO REFILLS for any discharge medications will be authorized once you are discharged, as it is imperative that you return to your primary care physician (or establish a relationship with a primary care physician if you do not have one) for your aftercare needs so that they can reassess your need for medications and monitor your lab values.   Increase activity slowly   Complete by: As directed    No wound care   Complete by: As directed       Allergies as  of 02/26/2021       Reactions   Sulfa Antibiotics Rash   Severe rash. Do not give sulfa medications   Bactrim [sulfamethoxazole-trimethoprim]    Rash   Cephalexin    Patient with severe drug reaction including exfoliating skin rash and hypotension after being prescribed both cephalexin and sulfamethoxazole-trimethoprim simultaneously. Favor SMX as much more likely culprit as patient had tolerated other cephalosporins in the past, but cannot say with complete certainty that this was not related to cephalexin.    Other    Patient states he is allergic to the TB test        Medication List     STOP taking these medications    furosemide 40 MG tablet Commonly known as: LASIX   Levemir FlexTouch 100 UNIT/ML FlexPen Generic drug: insulin detemir Replaced by: insulin glargine 100 UNIT/ML Solostar Pen       TAKE these medications    Accu-Chek Guide test strip Generic drug: glucose blood Use to check fasting blood sugar once daily. 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 Insulin dependent   Accu-Chek Softclix Lancets lancets USE TO MEASURE BLOOD SUGAR TWICE A DAY   amLODipine 10 MG tablet Commonly known as: NORVASC Take 1 tablet (10 mg total) by mouth daily.   atorvastatin 40 MG tablet Commonly known as: LIPITOR Take 1 tablet (40 mg total) by mouth daily.   calcitRIOL 0.25 MCG capsule Commonly known as: ROCALTROL Take 1 capsule (0.25 mcg total) by mouth daily.   carvedilol 12.5 MG tablet Commonly known as: COREG Take 1 tablet (12.5 mg total) by mouth 2 (two) times daily with a meal. What changed:  medication strength how much to take when to take this   hydrALAZINE 25 MG tablet Commonly known as: APRESOLINE Take 1 tablet (25 mg total) by mouth 3 (three) times daily.   insulin glargine 100 UNIT/ML Solostar Pen Commonly known as: LANTUS Inject 25 Units into the skin daily. May substitute with any other long-acting formulary. Replaces: Levemir FlexTouch 100 UNIT/ML FlexPen   Insulin  Pen Needle 32G X 4 MM Misc Use with insulin pens   NovoLOG FlexPen 100 UNIT/ML FlexPen Generic drug: insulin aspart Inject 5 Units into the skin 2 (two) times daily with a meal. What changed: how much to take   True Metrix Meter w/Device Kit Use to measure blood sugar twice a day   Accu-Chek Guide w/Device Kit Use as directed               Durable Medical Equipment  (From admission, onward)           Start     Ordered   02/24/21 1345  For home use only DME oxygen  Once  Question Answer Comment  Length of Need 6 Months   Mode or (Route) Nasal cannula   Liters per Minute 2   Frequency Continuous (stationary and portable oxygen unit needed)   Oxygen conserving device Yes   Oxygen delivery system Gas      02/24/21 1345            Follow-up Information     [facility] Follow up on 03/05/2021.   Specialty: Internal Medicine Why: has an apt already scheduled Contact information: [id number] [place] [phone]        [facility] Follow up in 2 week(s).   Specialty: Vascular Surgery Why: Office will call you to arrange your appt (sent) Contact information: [place] [address] [phone]               Allergies  Allergen Reactions   Sulfa Antibiotics Rash    Severe rash. Do not give sulfa medications   Bactrim [Sulfamethoxazole-Trimethoprim]     Rash   Cephalexin     Patient with severe drug reaction including exfoliating skin rash and hypotension after being prescribed both cephalexin and sulfamethoxazole-trimethoprim simultaneously. Favor SMX as much more likely culprit as patient had tolerated other cephalosporins in the past, but cannot say with complete certainty that this was not related to cephalexin.    Other     Patient states he is allergic to the TB test    Consultations: Renal Vascular  surgery   Procedures/Studies: DG Chest Port 1 View  Result Date: 02/20/2021 CLINICAL DATA:  Status post dialysis catheter placement EXAM: PORTABLE CHEST 1 VIEW COMPARISON:  02/17/2021 FINDINGS: Cardiac shadow is enlarged in size but stable. New left jugular dialysis catheter is noted in satisfactory position. No pneumothorax is noted. Lungs are well aerated bilaterally. Some improvement in the degree of vascular congestion is noted when compared with the prior exam. IMPRESSION: New dialysis catheter placement without evidence of pneumothorax. Improvement in vascular congestion when compare with the prior exam. Electronically Signed   By: Inez Catalina M.D.   On: 02/20/2021 19:33   DG Chest Port 1 View  Result Date: 02/17/2021 CLINICAL DATA:  Chest pain and shortness of breath. EXAM: PORTABLE CHEST 1 VIEW COMPARISON:  11/27/2020 FINDINGS: Moderate cardiomegaly remains stable. Diffuse pulmonary vascular congestion is again demonstrated. No evidence of frank pulmonary edema or focal consolidation. No evidence of pleural effusion. IMPRESSION: Stable cardiomegaly and chronic diffuse pulmonary vascular congestion. Electronically Signed   By: Marlaine Hind M.D.   On: 02/17/2021 16:09   DG Fluoro Guide CV Line-No Report  Result Date: 02/20/2021 Fluoroscopy was utilized by the requesting physician.  No radiographic interpretation.   VAS [place] UPPER EXT VEIN MAPPING (PRE-OP AVF)  Result Date: 02/20/2021 UPPER EXTREMITY VEIN MAPPING Patient Name:  [name]  Date of Exam:   02/20/2021 Medical Rec #: [id number]        Accession #:    [id number] Date of Birth: 08-20-1970       Patient Gender: M Patient Age:   68 years Exam Location:  [facility] Procedure:      VAS [place] UPPER EXT VEIN MAPPING (PRE-OP AVF) Referring Phys: [name] --------------------------------------------------------------------------------  Indications: Pre-access. Comparison Study: No prior study Performing Technologist: Maudry Mayhew MHA, RDMS, RVT, RDCS  Examination Guidelines: A complete evaluation includes B-mode imaging, spectral Doppler, color Doppler, and power Doppler as needed of all accessible  portions of each vessel. Bilateral testing is considered an integral part of a complete examination. Limited examinations for reoccurring indications may be performed as noted. +-----------------+-------------+----------+---------+ Right Cephalic   Diameter (cm)Depth (cm)Findings  +-----------------+-------------+----------+---------+ Shoulder             0.34                         +-----------------+-------------+----------+---------+ Prox upper arm       0.29                         +-----------------+-------------+----------+---------+ Mid upper arm        0.22                         +-----------------+-------------+----------+---------+ Dist upper arm       0.20                         +-----------------+-------------+----------+---------+ Antecubital fossa    0.23               branching +-----------------+-------------+----------+---------+ Prox forearm         0.23                         +-----------------+-------------+----------+---------+ Mid forearm          0.18                         +-----------------+-------------+----------+---------+ Dist forearm         0.19                         +-----------------+-------------+----------+---------+ Wrist                0.18                         +-----------------+-------------+----------+---------+ +-----------------+-------------+----------+--------------+ Right Basilic    Diameter (cm)Depth (cm)   Findings    +-----------------+-------------+----------+--------------+ Prox upper arm       0.28                              +-----------------+-------------+----------+--------------+ Mid upper arm        0.22                              +-----------------+-------------+----------+--------------+ Dist upper arm        0.25                              +-----------------+-------------+----------+--------------+ Antecubital fossa    0.18                 branching    +-----------------+-------------+----------+--------------+ Prox forearm         0.10                              +-----------------+-------------+----------+--------------+ Mid forearm          0.10                              +-----------------+-------------+----------+--------------+  Distal forearm       0.12                 branching    +-----------------+-------------+----------+--------------+ Wrist                                   not visualized +-----------------+-------------+----------+--------------+ +-----------------+-------------+----------+---------------------+ Left Cephalic    Diameter (cm)Depth (cm)      Findings        +-----------------+-------------+----------+---------------------+ Shoulder             0.33                                     +-----------------+-------------+----------+---------------------+ Prox upper arm       0.32                                     +-----------------+-------------+----------+---------------------+ Mid upper arm        0.34                                     +-----------------+-------------+----------+---------------------+ Dist upper arm       0.26                                     +-----------------+-------------+----------+---------------------+ Antecubital fossa    0.21                                     +-----------------+-------------+----------+---------------------+ Prox forearm         0.42                                     +-----------------+-------------+----------+---------------------+ Mid forearm          0.37                    thrombosed       +-----------------+-------------+----------+---------------------+ Dist forearm                            IV and not visualized  +-----------------+-------------+----------+---------------------+ Left basilic vein not evaluated *See table(s) above for measurements and observations.  Diagnosing physician: [name] MD Electronically signed by [name] MD on 02/20/2021 at 5:34:50 PM.    Final      Subjective:  Patient denies any complaints, discharge has been held on Saturday given transportation issues to HD as an outpatient.  Discharge Exam: Vitals:   02/26/21 0600 02/26/21 0757  BP: (!) 141/69 (!) 165/70  Pulse: 74 76  Resp: 16   Temp:  98 F (36.7 C)  SpO2: 100% 100%   Vitals:   02/26/21 0000 02/26/21 0400 02/26/21 0600 02/26/21 0757  BP: (!) 121/47 (!) 152/66 (!) 141/69 (!) 165/70  Pulse: 76 76 74 76  Resp: 18 19 16    Temp: 99.2 F (37.3 C) 98.9 F (37.2 C)  98 F (36.7 C)  TempSrc: Oral Oral  Oral  SpO2:  100% 100% 100%  Weight:      Height:        General: Pt is alert, awake, not in acute distress Cardiovascular: RRR, S1/S2 +, no rubs, no gallops Respiratory: CTA bilaterally, no wheezing, no rhonchi Abdominal: Soft, NT, ND, bowel sounds + Extremities: Right upper extremity edema.    The results of significant diagnostics from this hospitalization (including imaging, microbiology, ancillary and laboratory) are listed below for reference.     Microbiology: Recent Results (from the past 240 hour(s))  Resp Panel by RT-PCR (Flu A&B, Covid) Nasopharyngeal Swab     Status: None   Collection Time: 02/17/21  4:45 PM   Specimen: Nasopharyngeal Swab; Nasopharyngeal(NP) swabs in vial transport medium  Result Value Ref Range Status   SARS Coronavirus 2 by RT PCR NEGATIVE NEGATIVE Final    Comment: (NOTE) SARS-CoV-2 target nucleic acids are NOT DETECTED.  The SARS-CoV-2 RNA is generally detectable in upper respiratory specimens during the acute phase of infection. The lowest concentration of SARS-CoV-2 viral copies this assay can detect is 138 copies/mL. A negative result does  not preclude SARS-Cov-2 infection and should not be used as the sole basis for treatment or other patient management decisions. A negative result may occur with  improper specimen collection/handling, submission of specimen other than nasopharyngeal swab, presence of viral mutation(s) within the areas targeted by this assay, and inadequate number of viral copies(<138 copies/mL). A negative result must be combined with clinical observations, patient history, and epidemiological information. The expected result is Negative.  Fact Sheet for Patients:  EntrepreneurPulse.com.au  Fact Sheet for Healthcare Providers:  IncredibleEmployment.be  This test is no t yet approved or cleared by the [place] FDA and  has been authorized for detection and/or diagnosis of SARS-CoV-2 by FDA under an Emergency Use Authorization (EUA). This EUA will remain  in effect (meaning this test can be used) for the duration of the COVID-19 declaration under Section 564(b)(1) of the Act, 21 U.S.C.section 360bbb-3(b)(1), unless the authorization is terminated  or revoked sooner.       Influenza A by PCR NEGATIVE NEGATIVE Final   Influenza B by PCR NEGATIVE NEGATIVE Final    Comment: (NOTE) The Xpert Xpress SARS-CoV-2/FLU/RSV plus assay is intended as an aid in the diagnosis of influenza from Nasopharyngeal swab specimens and should not be used as a sole basis for treatment. Nasal washings and aspirates are unacceptable for Xpert Xpress SARS-CoV-2/FLU/RSV testing.  Fact Sheet for Patients: EntrepreneurPulse.com.au  Fact Sheet for Healthcare Providers: IncredibleEmployment.be  This test is not yet approved or cleared by the [place] FDA and has been authorized for detection and/or diagnosis of SARS-CoV-2 by FDA under an Emergency Use Authorization (EUA). This EUA will remain in effect (meaning this test can be used) for the  duration of the COVID-19 declaration under Section 564(b)(1) of the Act, 21 U.S.C. section 360bbb-3(b)(1), unless the authorization is terminated or revoked.  Performed at [facility], [place] [address] [place], [place] [address]   MRSA Next Gen by PCR, Nasal     Status: None   Collection Time: 02/17/21 10:09 PM   Specimen: Nasal Mucosa; Nasal Swab  Result Value Ref Range Status   MRSA by PCR Next Gen NOT DETECTED NOT DETECTED Final    Comment: (NOTE) The GeneXpert MRSA Assay (FDA approved for NASAL specimens only), is one component of a comprehensive MRSA colonization surveillance program. It is not intended to diagnose MRSA infection nor to guide or monitor treatment for  MRSA infections. Test performance is not FDA approved in patients less than 69 years old. Performed at [facility], [place] [address], [place], [place] [address]   Culture, blood (routine x 2)     Status: None   Collection Time: 02/18/21 12:24 AM   Specimen: BLOOD  Result Value Ref Range Status   Specimen Description BLOOD RIGHT ANTECUBITAL  Final   Special Requests   Final    BOTTLES DRAWN AEROBIC AND ANAEROBIC Blood Culture adequate volume   Culture   Final    NO GROWTH 5 DAYS Performed at [facility], [place] [address], [place], [place] [address]    Report Status 02/23/2021 FINAL  Final  Culture, blood (routine x 2)     Status: Abnormal   Collection Time: 02/18/21 12:24 AM   Specimen: BLOOD RIGHT HAND  Result Value Ref Range Status   Specimen Description BLOOD RIGHT HAND  Final   Special Requests   Final    BOTTLES DRAWN AEROBIC AND ANAEROBIC Blood Culture adequate volume   Culture  Setup Time   Final    GRAM POSITIVE COCCI AEROBIC BOTTLE ONLY CRITICAL RESULT CALLED TO, READ BACK BY AND VERIFIED WITH: L,CHEN PHARMD @1840  02/18/21 EB    Culture (A)  Final    STAPHYLOCOCCUS HOMINIS THE SIGNIFICANCE OF ISOLATING THIS ORGANISM FROM A SINGLE SET OF BLOOD CULTURES WHEN MULTIPLE SETS ARE  DRAWN IS UNCERTAIN. PLEASE NOTIFY THE MICROBIOLOGY DEPARTMENT WITHIN ONE WEEK IF SPECIATION AND SENSITIVITIES ARE REQUIRED. Performed at [facility], [place] [address], [place], [place] [address]    Report Status 02/19/2021 FINAL  Final  Blood Culture ID Panel (Reflexed)     Status: Abnormal   Collection Time: 02/18/21 12:24 AM  Result Value Ref Range Status   Enterococcus faecalis NOT DETECTED NOT DETECTED Final   Enterococcus Faecium NOT DETECTED NOT DETECTED Final   Listeria monocytogenes NOT DETECTED NOT DETECTED Final   Staphylococcus species DETECTED (A) NOT DETECTED Final    Comment: CRITICAL RESULT CALLED TO, READ BACK BY AND VERIFIED WITH: L,CHEN PHARMD @1840  02/18/21 EB    Staphylococcus aureus (BCID) NOT DETECTED NOT DETECTED Final   Staphylococcus epidermidis NOT DETECTED NOT DETECTED Final   Staphylococcus lugdunensis NOT DETECTED NOT DETECTED Final   Streptococcus species NOT DETECTED NOT DETECTED Final   Streptococcus agalactiae NOT DETECTED NOT DETECTED Final   Streptococcus pneumoniae NOT DETECTED NOT DETECTED Final   Streptococcus pyogenes NOT DETECTED NOT DETECTED Final   A.calcoaceticus-baumannii NOT DETECTED NOT DETECTED Final   Bacteroides fragilis NOT DETECTED NOT DETECTED Final   Enterobacterales NOT DETECTED NOT DETECTED Final   Enterobacter cloacae complex NOT DETECTED NOT DETECTED Final   Escherichia coli NOT DETECTED NOT DETECTED Final   Klebsiella aerogenes NOT DETECTED NOT DETECTED Final   Klebsiella oxytoca NOT DETECTED NOT DETECTED Final   Klebsiella pneumoniae NOT DETECTED NOT DETECTED Final   Proteus species NOT DETECTED NOT DETECTED Final   Salmonella species NOT DETECTED NOT DETECTED Final   Serratia marcescens NOT DETECTED NOT DETECTED Final   Haemophilus influenzae NOT DETECTED NOT DETECTED Final   Neisseria meningitidis NOT DETECTED NOT DETECTED Final   Pseudomonas aeruginosa NOT DETECTED NOT DETECTED Final   Stenotrophomonas maltophilia  NOT DETECTED NOT DETECTED Final   Candida albicans NOT DETECTED NOT DETECTED Final   Candida auris NOT DETECTED NOT DETECTED Final   Candida glabrata NOT DETECTED NOT DETECTED Final   Candida krusei NOT DETECTED NOT DETECTED Final  Candida parapsilosis NOT DETECTED NOT DETECTED Final   Candida tropicalis NOT DETECTED NOT DETECTED Final   Cryptococcus neoformans/gattii NOT DETECTED NOT DETECTED Final    Comment: Performed at [facility], [address] [place], [place] [address]     Labs: BNP (last 3 results) Recent Labs    08/23/20 0152 11/27/20 0609 02/17/21 1527  BNP 192.4* 1,009.9* 7,169.6*   Basic Metabolic Panel: Recent Labs  Lab 02/20/21 0946 02/21/21 0114 02/22/21 1018 02/24/21 0825 02/26/21 0149  NA 136  --  134* 135 137  K 3.5  --  3.8 3.9 3.9  CL 105  --  100 101 101  CO2 21*  --  25 27 27   GLUCOSE 91  --  127* 141* 110*  BUN 60*  --  43* 36* 28*  CREATININE 6.92*  --  6.29* 6.54* 6.17*  CALCIUM 7.1* 6.9* 7.1* 7.7* 7.8*  PHOS  --   --  4.8* 5.0*  --    Liver Function Tests: Recent Labs  Lab 02/22/21 1018 02/24/21 0825  ALBUMIN 1.5* 1.5*   No results for input(s): LIPASE, AMYLASE in the last 168 hours. No results for input(s): AMMONIA in the last 168 hours. CBC: Recent Labs  Lab 02/21/21 0114 02/22/21 0109 02/22/21 1018 02/23/21 1135 02/24/21 0825 02/26/21 0149  WBC 8.5 7.9 8.3 9.1 12.0* 10.5  NEUTROABS 6.4 5.5 6.0  --   --   --   HGB 8.2* 7.9* 7.2* 7.9* 7.2* 7.4*  HCT 26.1* 25.1* 23.6* 25.6* 23.2* 23.8*  MCV 84.7 83.9 86.1 86.8 86.9 86.2  PLT 328 308 300 328 319 369   Cardiac Enzymes: No results for input(s): CKTOTAL, CKMB, CKMBINDEX, TROPONINI in the last 168 hours. BNP: Invalid input(s): POCBNP CBG: Recent Labs  Lab 02/25/21 0830 02/25/21 1259 02/25/21 1649 02/25/21 2043 02/26/21 0821  GLUCAP 201* 106* 95 118* 97   D-Dimer No results for input(s): DDIMER in the last 72 hours. Hgb A1c No results for input(s): HGBA1C  in the last 72 hours. Lipid Profile No results for input(s): CHOL, HDL, LDLCALC, TRIG, CHOLHDL, LDLDIRECT in the last 72 hours. Thyroid function studies No results for input(s): TSH, T4TOTAL, T3FREE, THYROIDAB in the last 72 hours.  Invalid input(s): FREET3 Anemia work up Recent Labs    02/23/21 1135  VITAMINB12 481  FOLATE 10.9  FERRITIN 526*  TIBC 227*  IRON 28*  RETICCTPCT 2.2   Urinalysis    Component Value Date/Time   COLORURINE YELLOW 02/19/2021 0045   APPEARANCEUR HAZY (A) 02/19/2021 0045   APPEARANCEUR Clear 05/09/2020 1509   LABSPEC 1.013 02/19/2021 0045   PHURINE 5.0 02/19/2021 0045   GLUCOSEU >=500 (A) 02/19/2021 0045   HGBUR MODERATE (A) 02/19/2021 0045   BILIRUBINUR NEGATIVE 02/19/2021 0045   BILIRUBINUR Negative 05/09/2020 1509   KETONESUR NEGATIVE 02/19/2021 0045   PROTEINUR >=300 (A) 02/19/2021 0045   UROBILINOGEN 0.2 02/04/2020 1612   UROBILINOGEN 0.2 02/16/2014 0045   NITRITE NEGATIVE 02/19/2021 0045   LEUKOCYTESUR NEGATIVE 02/19/2021 0045   Sepsis Labs Invalid input(s): PROCALCITONIN,  WBC,  LACTICIDVEN Microbiology Recent Results (from the past 240 hour(s))  Resp Panel by RT-PCR (Flu A&B, Covid) Nasopharyngeal Swab     Status: None   Collection Time: 02/17/21  4:45 PM   Specimen: Nasopharyngeal Swab; Nasopharyngeal(NP) swabs in vial transport medium  Result Value Ref Range Status   SARS Coronavirus 2 by RT PCR NEGATIVE NEGATIVE Final    Comment: (NOTE) SARS-CoV-2 target nucleic acids are NOT DETECTED.  The SARS-CoV-2 RNA is generally detectable in upper respiratory specimens during the acute phase of infection. The lowest concentration of SARS-CoV-2 viral copies this assay can detect is 138 copies/mL. A negative result does not preclude SARS-Cov-2 infection and should not be used as the sole basis for treatment or other patient management decisions. A negative result may occur with  improper specimen collection/handling, submission of  specimen other than nasopharyngeal swab, presence of viral mutation(s) within the areas targeted by this assay, and inadequate number of viral copies(<138 copies/mL). A negative result must be combined with clinical observations, patient history, and epidemiological information. The expected result is Negative.  Fact Sheet for Patients:  EntrepreneurPulse.com.au  Fact Sheet for Healthcare Providers:  IncredibleEmployment.be  This test is no t yet approved or cleared by the [place] FDA and  has been authorized for detection and/or diagnosis of SARS-CoV-2 by FDA under an Emergency Use Authorization (EUA). This EUA will remain  in effect (meaning this test can be used) for the duration of the COVID-19 declaration under Section 564(b)(1) of the Act, 21 U.S.C.section 360bbb-3(b)(1), unless the authorization is terminated  or revoked sooner.       Influenza A by PCR NEGATIVE NEGATIVE Final   Influenza B by PCR NEGATIVE NEGATIVE Final    Comment: (NOTE) The Xpert Xpress SARS-CoV-2/FLU/RSV plus assay is intended as an aid in the diagnosis of influenza from Nasopharyngeal swab specimens and should not be used as a sole basis for treatment. Nasal washings and aspirates are unacceptable for Xpert Xpress SARS-CoV-2/FLU/RSV testing.  Fact Sheet for Patients: EntrepreneurPulse.com.au  Fact Sheet for Healthcare Providers: IncredibleEmployment.be  This test is not yet approved or cleared by the [place] FDA and has been authorized for detection and/or diagnosis of SARS-CoV-2 by FDA under an Emergency Use Authorization (EUA). This EUA will remain in effect (meaning this test can be used) for the duration of the COVID-19 declaration under Section 564(b)(1) of the Act, 21 U.S.C. section 360bbb-3(b)(1), unless the authorization is terminated or revoked.  Performed at [facility], [place] [address]  [place], [place] [address]   MRSA Next Gen by PCR, Nasal     Status: None   Collection Time: 02/17/21 10:09 PM   Specimen: Nasal Mucosa; Nasal Swab  Result Value Ref Range Status   MRSA by PCR Next Gen NOT DETECTED NOT DETECTED Final    Comment: (NOTE) The GeneXpert MRSA Assay (FDA approved for NASAL specimens only), is one component of a comprehensive MRSA colonization surveillance program. It is not intended to diagnose MRSA infection nor to guide or monitor treatment for MRSA infections. Test performance is not FDA approved in patients less than 68 years old. Performed at [facility], [place] [address], [place], [place] [address]   Culture, blood (routine x 2)     Status: None   Collection Time: 02/18/21 12:24 AM   Specimen: BLOOD  Result Value Ref Range Status   Specimen Description BLOOD RIGHT ANTECUBITAL  Final   Special Requests   Final    BOTTLES DRAWN AEROBIC AND ANAEROBIC Blood Culture adequate volume   Culture   Final    NO GROWTH 5 DAYS Performed at [facility], [place] [address], [place], [place] [address]    Report Status 02/23/2021 FINAL  Final  Culture, blood (routine x 2)     Status: Abnormal   Collection Time: 02/18/21 12:24 AM   Specimen: BLOOD RIGHT HAND  Result Value Ref Range Status  Specimen Description BLOOD RIGHT HAND  Final   Special Requests   Final    BOTTLES DRAWN AEROBIC AND ANAEROBIC Blood Culture adequate volume   Culture  Setup Time   Final    GRAM POSITIVE COCCI AEROBIC BOTTLE ONLY CRITICAL RESULT CALLED TO, READ BACK BY AND VERIFIED WITH: L,CHEN PHARMD @1840  02/18/21 EB    Culture (A)  Final    STAPHYLOCOCCUS HOMINIS THE SIGNIFICANCE OF ISOLATING THIS ORGANISM FROM A SINGLE SET OF BLOOD CULTURES WHEN MULTIPLE SETS ARE DRAWN IS UNCERTAIN. PLEASE NOTIFY THE MICROBIOLOGY DEPARTMENT WITHIN ONE WEEK IF SPECIATION AND SENSITIVITIES ARE REQUIRED. Performed at [facility], [place] [address], [place], [place] [address]    Report Status  02/19/2021 FINAL  Final  Blood Culture ID Panel (Reflexed)     Status: Abnormal   Collection Time: 02/18/21 12:24 AM  Result Value Ref Range Status   Enterococcus faecalis NOT DETECTED NOT DETECTED Final   Enterococcus Faecium NOT DETECTED NOT DETECTED Final   Listeria monocytogenes NOT DETECTED NOT DETECTED Final   Staphylococcus species DETECTED (A) NOT DETECTED Final    Comment: CRITICAL RESULT CALLED TO, READ BACK BY AND VERIFIED WITH: L,CHEN PHARMD @1840  02/18/21 EB    Staphylococcus aureus (BCID) NOT DETECTED NOT DETECTED Final   Staphylococcus epidermidis NOT DETECTED NOT DETECTED Final   Staphylococcus lugdunensis NOT DETECTED NOT DETECTED Final   Streptococcus species NOT DETECTED NOT DETECTED Final   Streptococcus agalactiae NOT DETECTED NOT DETECTED Final   Streptococcus pneumoniae NOT DETECTED NOT DETECTED Final   Streptococcus pyogenes NOT DETECTED NOT DETECTED Final   A.calcoaceticus-baumannii NOT DETECTED NOT DETECTED Final   Bacteroides fragilis NOT DETECTED NOT DETECTED Final   Enterobacterales NOT DETECTED NOT DETECTED Final   Enterobacter cloacae complex NOT DETECTED NOT DETECTED Final   Escherichia coli NOT DETECTED NOT DETECTED Final   Klebsiella aerogenes NOT DETECTED NOT DETECTED Final   Klebsiella oxytoca NOT DETECTED NOT DETECTED Final   Klebsiella pneumoniae NOT DETECTED NOT DETECTED Final   Proteus species NOT DETECTED NOT DETECTED Final   Salmonella species NOT DETECTED NOT DETECTED Final   Serratia marcescens NOT DETECTED NOT DETECTED Final   Haemophilus influenzae NOT DETECTED NOT DETECTED Final   Neisseria meningitidis NOT DETECTED NOT DETECTED Final   Pseudomonas aeruginosa NOT DETECTED NOT DETECTED Final   Stenotrophomonas maltophilia NOT DETECTED NOT DETECTED Final   Candida albicans NOT DETECTED NOT DETECTED Final   Candida auris NOT DETECTED NOT DETECTED Final   Candida glabrata NOT DETECTED NOT DETECTED Final   Candida krusei NOT DETECTED NOT  DETECTED Final   Candida parapsilosis NOT DETECTED NOT DETECTED Final   Candida tropicalis NOT DETECTED NOT DETECTED Final   Cryptococcus neoformans/gattii NOT DETECTED NOT DETECTED Final    Comment: Performed at [facility], [place], [place], [place] [address]     SIGNED:   Phillips Climes, MD  [facility] 02/26/2021, 10:56 AM Pager   If 7PM-7AM, please contact night-coverage [email or web address] Password [email or web address]

## 2021-02-27 ENCOUNTER — Telehealth: Payer: Self-pay | Admitting: Nephrology

## 2021-02-27 DIAGNOSIS — N186 End stage renal disease: Secondary | ICD-10-CM | POA: Insufficient documentation

## 2021-02-27 DIAGNOSIS — N189 Chronic kidney disease, unspecified: Secondary | ICD-10-CM | POA: Insufficient documentation

## 2021-02-27 DIAGNOSIS — D688 Other specified coagulation defects: Secondary | ICD-10-CM | POA: Insufficient documentation

## 2021-02-27 DIAGNOSIS — T7840XA Allergy, unspecified, initial encounter: Secondary | ICD-10-CM | POA: Insufficient documentation

## 2021-02-27 DIAGNOSIS — Z4931 Encounter for adequacy testing for hemodialysis: Secondary | ICD-10-CM | POA: Insufficient documentation

## 2021-02-27 DIAGNOSIS — N2581 Secondary hyperparathyroidism of renal origin: Secondary | ICD-10-CM | POA: Insufficient documentation

## 2021-02-27 DIAGNOSIS — E1129 Type 2 diabetes mellitus with other diabetic kidney complication: Secondary | ICD-10-CM | POA: Insufficient documentation

## 2021-02-27 DIAGNOSIS — R509 Fever, unspecified: Secondary | ICD-10-CM | POA: Insufficient documentation

## 2021-02-27 DIAGNOSIS — Z91158 Patient's noncompliance with renal dialysis for other reason: Secondary | ICD-10-CM | POA: Insufficient documentation

## 2021-02-27 DIAGNOSIS — L299 Pruritus, unspecified: Secondary | ICD-10-CM | POA: Insufficient documentation

## 2021-02-27 DIAGNOSIS — R0602 Shortness of breath: Secondary | ICD-10-CM | POA: Insufficient documentation

## 2021-02-27 DIAGNOSIS — D631 Anemia in chronic kidney disease: Secondary | ICD-10-CM | POA: Insufficient documentation

## 2021-02-27 DIAGNOSIS — R197 Diarrhea, unspecified: Secondary | ICD-10-CM | POA: Insufficient documentation

## 2021-02-27 DIAGNOSIS — T782XXA Anaphylactic shock, unspecified, initial encounter: Secondary | ICD-10-CM | POA: Insufficient documentation

## 2021-02-27 NOTE — Telephone Encounter (Signed)
Transition of care contact from inpatient facility  Date of Discharge: 02/26/21 Date of Contact: 02/27/21 Method of contact: Phone  Attempted to contact patient to discuss transition of care from inpatient admission. Patient did not answer the phone. Unable to leave voicemail.

## 2021-03-05 ENCOUNTER — Ambulatory Visit: Payer: Self-pay | Admitting: Nurse Practitioner

## 2021-03-06 DIAGNOSIS — E441 Mild protein-calorie malnutrition: Secondary | ICD-10-CM | POA: Insufficient documentation

## 2021-03-12 ENCOUNTER — Other Ambulatory Visit: Payer: Self-pay

## 2021-03-13 ENCOUNTER — Other Ambulatory Visit: Payer: Self-pay

## 2021-03-13 ENCOUNTER — Emergency Department (HOSPITAL_COMMUNITY): Payer: Self-pay

## 2021-03-13 ENCOUNTER — Encounter (HOSPITAL_COMMUNITY): Payer: Self-pay

## 2021-03-13 ENCOUNTER — Emergency Department (HOSPITAL_COMMUNITY)
Admission: EM | Admit: 2021-03-13 | Discharge: 2021-03-13 | Disposition: A | Discharge status: Home / Self Care | Payer: Self-pay | Attending: Emergency Medicine | Admitting: Emergency Medicine

## 2021-03-13 DIAGNOSIS — M7989 Other specified soft tissue disorders: Secondary | ICD-10-CM | POA: Insufficient documentation

## 2021-03-13 DIAGNOSIS — I130 Hypertensive heart and chronic kidney disease with heart failure and stage 1 through stage 4 chronic kidney disease, or unspecified chronic kidney disease: Secondary | ICD-10-CM | POA: Insufficient documentation

## 2021-03-13 DIAGNOSIS — R0789 Other chest pain: Secondary | ICD-10-CM | POA: Insufficient documentation

## 2021-03-13 DIAGNOSIS — R0601 Orthopnea: Secondary | ICD-10-CM | POA: Insufficient documentation

## 2021-03-13 DIAGNOSIS — N184 Chronic kidney disease, stage 4 (severe): Secondary | ICD-10-CM | POA: Insufficient documentation

## 2021-03-13 DIAGNOSIS — R0609 Other forms of dyspnea: Secondary | ICD-10-CM | POA: Insufficient documentation

## 2021-03-13 DIAGNOSIS — Z794 Long term (current) use of insulin: Secondary | ICD-10-CM | POA: Insufficient documentation

## 2021-03-13 DIAGNOSIS — R0602 Shortness of breath: Secondary | ICD-10-CM | POA: Insufficient documentation

## 2021-03-13 DIAGNOSIS — Z7984 Long term (current) use of oral hypoglycemic drugs: Secondary | ICD-10-CM | POA: Insufficient documentation

## 2021-03-13 DIAGNOSIS — I5041 Acute combined systolic (congestive) and diastolic (congestive) heart failure: Secondary | ICD-10-CM | POA: Insufficient documentation

## 2021-03-13 DIAGNOSIS — Z79899 Other long term (current) drug therapy: Secondary | ICD-10-CM | POA: Insufficient documentation

## 2021-03-13 DIAGNOSIS — R079 Chest pain, unspecified: Secondary | ICD-10-CM

## 2021-03-13 DIAGNOSIS — E1140 Type 2 diabetes mellitus with diabetic neuropathy, unspecified: Secondary | ICD-10-CM | POA: Insufficient documentation

## 2021-03-13 DIAGNOSIS — Z20822 Contact with and (suspected) exposure to covid-19: Secondary | ICD-10-CM | POA: Insufficient documentation

## 2021-03-13 DIAGNOSIS — Z992 Dependence on renal dialysis: Secondary | ICD-10-CM | POA: Insufficient documentation

## 2021-03-13 DIAGNOSIS — Z9289 Personal history of other medical treatment: Secondary | ICD-10-CM

## 2021-03-13 LAB — COMPREHENSIVE METABOLIC PANEL
ALT: 30 U/L (ref 0–44)
AST: 30 U/L (ref 15–41)
Albumin: 2.2 g/dL — ABNORMAL LOW (ref 3.5–5.0)
Alkaline Phosphatase: 129 U/L — ABNORMAL HIGH (ref 38–126)
Anion gap: 10 (ref 5–15)
BUN: 40 mg/dL — ABNORMAL HIGH (ref 6–20)
CO2: 27 mmol/L (ref 22–32)
Calcium: 8 mg/dL — ABNORMAL LOW (ref 8.9–10.3)
Chloride: 100 mmol/L (ref 98–111)
Creatinine, Ser: 6.39 mg/dL — ABNORMAL HIGH (ref 0.61–1.24)
GFR, Estimated: 10 mL/min — ABNORMAL LOW (ref >60)
Glucose, Bld: 120 mg/dL — ABNORMAL HIGH (ref 70–99)
Potassium: 3.9 mmol/L (ref 3.5–5.1)
Sodium: 137 mmol/L (ref 135–145)
Total Bilirubin: 0.7 mg/dL (ref 0.3–1.2)
Total Protein: 6.2 g/dL — ABNORMAL LOW (ref 6.5–8.1)

## 2021-03-13 LAB — CBC WITH DIFFERENTIAL/PLATELET
Abs Immature Granulocytes: 0.03 10*3/uL (ref 0.00–0.07)
Basophils Absolute: 0 10*3/uL (ref 0.0–0.1)
Basophils Relative: 1 %
Eosinophils Absolute: 0.2 10*3/uL (ref 0.0–0.5)
Eosinophils Relative: 3 %
HCT: 26.8 % — ABNORMAL LOW (ref 39.0–52.0)
Hemoglobin: 8.3 g/dL — ABNORMAL LOW (ref 13.0–17.0)
Immature Granulocytes: 0 %
Lymphocytes Relative: 18 %
Lymphs Abs: 1.3 10*3/uL (ref 0.7–4.0)
MCH: 27.9 pg (ref 26.0–34.0)
MCHC: 31 g/dL (ref 30.0–36.0)
MCV: 89.9 fL (ref 80.0–100.0)
Monocytes Absolute: 0.9 10*3/uL (ref 0.1–1.0)
Monocytes Relative: 13 %
Neutro Abs: 4.6 10*3/uL (ref 1.7–7.7)
Neutrophils Relative %: 65 %
Platelets: 367 10*3/uL (ref 150–400)
RBC: 2.98 MIL/uL — ABNORMAL LOW (ref 4.22–5.81)
RDW: 14.7 % (ref 11.5–15.5)
WBC: 7.1 10*3/uL (ref 4.0–10.5)
nRBC: 0 % (ref 0.0–0.2)

## 2021-03-13 LAB — RESP PANEL BY RT-PCR (FLU A&B, COVID) ARPGX2
Influenza A by PCR: NEGATIVE
Influenza B by PCR: NEGATIVE
SARS Coronavirus 2 by RT PCR: NEGATIVE

## 2021-03-13 LAB — BRAIN NATRIURETIC PEPTIDE: B Natriuretic Peptide: 1513.1 pg/mL — ABNORMAL HIGH (ref 0.0–100.0)

## 2021-03-13 LAB — TROPONIN I (HIGH SENSITIVITY)
Troponin I (High Sensitivity): 79 ng/L — ABNORMAL HIGH (ref <18)
Troponin I (High Sensitivity): 76 ng/L — ABNORMAL HIGH (ref <18)

## 2021-03-13 NOTE — Discharge Instructions (Addendum)
You were seen in the emergency department today for shortness of breath and chest pain.  We did not find an acute cause for your pain today. I anticipate your symptoms are likely related to requiring dialysis treatment. We were able to get you a dialysis appointment scheduled for tomorrow at 11:45am, you will need to arrive at 11:20am.   Continue to monitor how you're doing and return to the ER for new or worsening symptoms such as worsening pain, lightheadedness.   It has been a pleasure seeing and caring for you today and I hope you start feeling better soon!

## 2021-03-13 NOTE — ED Provider Notes (Signed)
[facility] EMERGENCY DEPARTMENT Provider Note   CSN: [id number] Arrival date & time: 03/13/21  1147     History Chief Complaint  Patient presents with   Surgical Site / Chest Pain    [name] is a 50 y.o. male with history of stage 4 CKD, CHF with EF 30-35%, LBBB, and T2DM and who presents with chest pain x3 weeks.  Central chest pain, described as pressure, no aggravating factors.  Patient had a dialysis catheter placed in the left chest 3 weeks ago.  Normally goes to dialysis Tuesday, Thursday, and Saturday.  Patient was due for dialysis today, but had increased shortness of breath and decided to present to the hospital.  Reports orthopnea and dyspnea on exertion. Was recently placed on home oxygen at 2 L.  Patient took 324 mg of aspirin per 911 operator, and states this improved his chest pain.  States he has had bilateral leg swelling since hospitalization, but this has been improving with dialysis. No other missed dialysis appointments until today.   Per EMS upon arrival patient did not have oxygen on, and he was saturating at 70% on room air.  Was also noted to be hypertensive at 200/100, did not have a chance to take his blood pressure medication this morning.  HPI     Past Medical History:  Diagnosis Date   Anxiety 02/2019   Anxiety 10/2019   Diabetes mellitus type II, uncontrolled 07/17/2006   Elevated alkaline phosphatase level 01/2019   ESSENTIAL HYPERTENSION 07/17/2006   HYPERLIPIDEMIA 11/24/2008   Insomnia 02/2019   Left bundle branch block 02/22/2019   Lesion of lip 10/2019   Pneumonia 10/22/2011   Suicidal ideations 02/2019   Vitamin D deficiency 01/2019    Patient Active Problem List   Diagnosis Date Noted   Hypertensive urgency 02/18/2021   Type 2 diabetes mellitus ([place]) 02/17/2021   Acute combined systolic and diastolic congestive heart failure ([place]) 11/29/2020   Hypertensive crisis 11/27/2020   Lower extremity edema 03/07/2020    Stage 4 chronic kidney disease ([place]) 02/22/2020   Left ventricular dysfunction 04/27/2019   Hemoglobin A1C greater than 9%, indicating poor diabetic control 07/29/2018   New onset left bundle branch block (LBBB)    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   Hyperlipidemia LDL goal <70 11/24/2008   Uncontrolled type 2 diabetes mellitus with peripheral neuropathy 07/17/2006   Essential hypertension 07/17/2006    Past Surgical History:  Procedure Laterality Date   ARM SURGERY     AV FISTULA PLACEMENT Right 02/20/2021   Procedure: RIGHT ARTERIOVENOUS GRAFT CREATION;  Surgeon: Angelia Mould, MD;  Location: [facility];  Service: Vascular;  Laterality: Right;   INSERTION OF DIALYSIS CATHETER Left 02/20/2021   Procedure: INSERTION OF DIALYSIS CATHETER USING PALINDROME CATHETER;  Surgeon: Angelia Mould, MD;  Location: [facility] OR;  Service: Vascular;  Laterality: Left;   ULTRASOUND GUIDANCE FOR VASCULAR ACCESS  02/20/2021   Procedure: ULTRASOUND GUIDANCE FOR VASCULAR ACCESS;  Surgeon: Angelia Mould, MD;  Location: [facility] OR;  Service: Vascular;;       Family History  Problem Relation Age of Onset   Hypertension Mother 21   Cancer Mother        bladder cancer   Alcohol abuse Father    Hypertension Brother     Social History   Tobacco Use   Smoking status: Never   Smokeless tobacco: Never  Vaping Use   Vaping Use: Never used  Substance Use Topics  Alcohol use: No    Alcohol/week: 0.0 standard drinks   Drug use: No    Home Medications Prior to Admission medications   Medication Sig Start Date End Date Taking? Authorizing Provider  amLODipine (NORVASC) 10 MG tablet Take 1 tablet (10 mg total) by mouth daily. 02/26/21 03/28/21  [name], [name], MD  atorvastatin (LIPITOR) 40 MG tablet Take 1 tablet (40 mg total) by mouth daily. 02/26/21   [name], [name], MD  Blood Glucose Monitoring Suppl (ACCU-CHEK GUIDE) w/Device KIT Use as directed 11/29/20   [name], MD  Blood Glucose Monitoring Suppl (TRUE METRIX METER) w/Device KIT Use to measure blood sugar twice a day 02/22/20   [name], MD  calcitRIOL (ROCALTROL) 0.25 MCG capsule Take 1 capsule (0.25 mcg total) by mouth daily. 02/26/21   [name], [name], MD  carvedilol (COREG) 12.5 MG tablet Take 1 tablet (12.5 mg total) by mouth 2 (two) times daily with a meal. 02/26/21   [name], [name], MD  glucose blood test strip Use to check fasting blood sugar once daily. 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 Insulin dependent 11/29/20   [name], MD  hydrALAZINE (APRESOLINE) 25 MG tablet Take 1 tablet (25 mg total) by mouth 3 (three) times daily. 02/26/21   [name], [name], MD  insulin aspart (NOVOLOG FLEXPEN) 100 UNIT/ML FlexPen Inject 5 Units into the skin 2 (two) times daily with a meal. 02/26/21   [name], [name], MD  insulin glargine (LANTUS) 100 UNIT/ML Solostar Pen Inject 25 Units into the skin daily. May substitute with any other long-acting formulary. 02/26/21   [name], [name], MD  Insulin Pen Needle 32G X 4 MM MISC Use with insulin pens 02/26/21   [name], [name], MD  TRUEplus Lancets 28G MISC USE TO MEASURE BLOOD SUGAR TWICE A DAY 11/29/20 11/29/21  [name], MD  gabapentin (NEURONTIN) 300 MG capsule Take 1 capsule (300 mg total) by mouth 3 (three) times daily. Patient not taking: Reported on 07/27/2019 07/29/18 08/30/19  [name], FNP  insulin detemir (LEVEMIR FLEXTOUCH) 100 UNIT/ML FlexPen Inject 35 Units into the skin daily. Patient not taking: Reported on 02/17/2021 11/29/20 02/24/21  [name], MD  sildenafil (VIAGRA) 25 MG tablet Take 1 tablet (25 mg total) by mouth as needed for erectile dysfunction. for erectile dysfunction. Patient not taking: Reported on 08/11/2020 10/13/19 11/27/20  [name], FNP  sitaGLIPtin (JANUVIA) 25 MG tablet Take 1 tablet (25 mg total) by mouth daily. Patient not taking: Reported on 08/11/2020 02/22/20 11/27/20   [name], MD    Allergies    Sulfa antibiotics, Bactrim [sulfamethoxazole-trimethoprim], Cephalexin, and Other  Review of Systems   Review of Systems  Constitutional:  Negative for chills and fever.  HENT:  Negative for congestion.   Respiratory:  Positive for cough and shortness of breath.        Nonproductive  Cardiovascular:  Positive for chest pain and leg swelling. Negative for palpitations.  Gastrointestinal:  Negative for abdominal pain, constipation, diarrhea, nausea and vomiting.  Genitourinary:  Negative for decreased urine volume, difficulty urinating and dysuria.  Skin:  Negative for color change.  All other systems reviewed and are negative.  Physical Exam Updated Vital Signs BP (!) 165/83   Pulse 86   Temp 98.7 F (37.1 C) (Oral)   Resp 16   Ht 5' 7"  (1.702 m)   Wt 77.1 kg   SpO2 94%   BMI 26.63 kg/m  Physical Exam Vitals and nursing note reviewed.  Constitutional:      Appearance: Normal appearance.  HENT:     Head: Normocephalic and atraumatic.  Eyes:     Conjunctiva/sclera: Conjunctivae normal.  Cardiovascular:     Rate and Rhythm: Normal rate and regular rhythm.     Arteriovenous access: Right arteriovenous access is present.    Comments: Pulses equal and normal in BLE Pulmonary:     Effort: Pulmonary effort is normal. No respiratory distress.     Breath sounds: Normal breath sounds.  Chest:     Comments: Dialysis cath in left chest, no tenderness, redness or swelling Abdominal:     General: There is no distension.     Palpations: Abdomen is soft.     Tenderness: There is no abdominal tenderness.  Musculoskeletal:     Right lower leg: 1+ Pitting Edema present.     Left lower leg: 1+ Pitting Edema present.     Comments: Diffuse right forearm swelling distal to surgical wound. Partially healed surgical scar in right AC from right AV fistula creation. No overlying erythema, tenderness or drainage. No skin changes to lower extremities.    Skin:    General: Skin is warm and dry.  Neurological:     General: No focal deficit present.     Mental Status: He is alert.    ED Results / Procedures / Treatments   Labs (all labs ordered are listed, but only abnormal results are displayed) Labs Reviewed  CBC WITH DIFFERENTIAL/PLATELET - Abnormal; Notable for the following components:      Result Value   RBC 2.98 (*)    Hemoglobin 8.3 (*)    HCT 26.8 (*)    All other components within normal limits  COMPREHENSIVE METABOLIC PANEL - Abnormal; Notable for the following components:   Glucose, Bld 120 (*)    BUN 40 (*)    Creatinine, Ser 6.39 (*)    Calcium 8.0 (*)    Total Protein 6.2 (*)    Albumin 2.2 (*)    Alkaline Phosphatase 129 (*)    GFR, Estimated 10 (*)    All other components within normal limits  BRAIN NATRIURETIC PEPTIDE - Abnormal; Notable for the following components:   B Natriuretic Peptide 1,513.1 (*)    All other components within normal limits  TROPONIN I (HIGH SENSITIVITY) - Abnormal; Notable for the following components:   Troponin I (High Sensitivity) 79 (*)    All other components within normal limits  TROPONIN I (HIGH SENSITIVITY) - Abnormal; Notable for the following components:   Troponin I (High Sensitivity) 76 (*)    All other components within normal limits  RESP PANEL BY RT-PCR (FLU A&B, COVID) [id number]    EKG EKG Interpretation  Date/Time:  Tuesday March 13 2021 11:54:03 EDT Ventricular Rate:  83 PR Interval:  145 QRS Duration: 155 QT Interval:  422 QTC Calculation: 496 R Axis:   -33 Text Interpretation: Sinus rhythm Left bundle branch block Confirmed by Lennice Sites ([id number]) on 03/13/2021 12:11:38 PM  Radiology DG Chest 2 View  Result Date: 03/13/2021 CLINICAL DATA:  Shortness of breath. Chest pain, history of CHF, on dialysis. EXAM: CHEST - 2 VIEW COMPARISON:  Prior chest radiographs 02/20/2021 and earlier. FINDINGS: Left-sided dialysis catheter with tip projecting at the level  of the upper right atrium. Moderate cardiomegaly, unchanged. Pulmonary vascular congestion without overt pulmonary edema. No appreciable airspace consolidation. No evidence of pleural effusion or pneumothorax. No acute bony abnormality identified.  IMPRESSION: Moderate cardiomegaly, unchanged. Central pulmonary vascular congestion without overt pulmonary edema. No appreciable airspace consolidation. Electronically Signed   By: Kellie Simmering D.O.   On: 03/13/2021 13:23    Procedures Procedures   Medications Ordered in ED Medications - No data to display  ED Course  I have reviewed the triage vital signs and the nursing notes.  Pertinent labs & imaging results that were available during my care of the patient were reviewed by me and considered in my medical decision making (see chart for details).    MDM Rules/Calculators/A&P                           Patient is 50 y/o male with history of stage 4 CKD, CHF with EF 30-35%, LBBB and T2DM and who presents with chest pain and shortness of breath x 3 weeks. Had dialysis cath placed in left chest 3 weeks ago. Complaining of centralized chest pain, orthopnea and dyspnea on exertion. Chronically on 2L oxygen at baseline.   On exam patient is afebrile, not tachycardic, not hypoxic, and in no acute distress. Lung sounds are clear to auscultation in all fields.  He is saturating at 100% on 2 L nasal cannula.  Abdomen soft, nontender and nondistended.  Bilateral 1+ pitting edema to the level of the mid shins, patient states they appear improved. BLE neurovascularly intact as above. Well's criteria negative for DVT.  Initial troponin of 79, delta troponin 76.  Creatinine 6.39, stable compared to prior.  CMP shows normal sodium and potassium.  CXR with central pulmonary vascular congestion without pulmonary edema or effusion. EKG sinus rhythm and LBBB.    Patient is persistently hypertensive, states he did not take his hypertension medication this morning. BNP  appears to be around baseline, doubt symptoms due to acute heart failure exacerbation.  On reevaluation patient states that his chest pain pain has improved.  No acute etiology found for patient's chest pain.  Due to normal electrolytes and no EKG changes, patient is not requiring emergent dialysis today. Consulted with renal coordinator who was able to set up patient with dialysis appointment tomorrow. Patient is hemodynamically stable and not requiring admission or inpatient treatment for his symptoms at this time. Plan to discharge to home with strict return precautions and instructions for dialysis appointment tomorrow. Patient agreeable to plan.   I discussed this case with my attending physician Dr. Ronnald Nian who cosigned this note including patient's presenting symptoms, physical exam, and planned diagnostics and interventions. Attending physician stated agreement with plan or made changes to plan which were implemented.    Final Clinical Impression(s) / ED Diagnoses Final diagnoses:  Chest pain, unspecified type  History of renal dialysis    Rx / DC Orders ED Discharge Orders     None        Saheed Carrington T, PA-C 03/13/21 [place], [place], DO 03/13/21 2345

## 2021-03-13 NOTE — ED Notes (Signed)
Patient transported to X-ray 

## 2021-03-13 NOTE — ED Notes (Signed)
Pt provided with graham crackers, peanut butter, and ice water

## 2021-03-13 NOTE — Progress Notes (Signed)
Contacted by provider, [place], in the ED with request that pt receive HD treatment tomorrow due to missed treatment today. Contacted [facility] and spoke to [place], Agricultural consultant. Pt needs to arrive tomorrow at 11:20 for 11:45 chair time. Update provided to [place] with the above info. Pt will need to contact transportation today for transport for tomorrow's appt.  Melven Sartorius Renal Navigator [phone]

## 2021-03-13 NOTE — ED Triage Notes (Signed)
Pt arrived to ED via EMS from home w/ c/o surgical site/CP x 3 weeks. Pt had dialysis cath placed 3 weeks ago and has been on dialysis for 3 weeks, T, TH, Sat. Pt was due for dialysis today, but didn't go. Pt having increased shob w/ exertion. Pt on 2L O2 at baseline and was recently placed on home O2. EMS reports upon their arrival pt did not have the O2 on d/t he said it made the pain worse. RA sat 70%. EMS placed pt on 4L via [place]. EMS reports pt O2 sat recovered quickly. 911 operator told pt to take 324mg  of ASA which he took. EMS noted pt EKG to have L BBB and peaked T waves. Pt hypertensive at 200/100 and did not have a chance to take his BP meds this morning.

## 2021-03-22 ENCOUNTER — Encounter: Payer: Self-pay | Admitting: Vascular Surgery

## 2021-03-22 ENCOUNTER — Other Ambulatory Visit (HOSPITAL_BASED_OUTPATIENT_CLINIC_OR_DEPARTMENT_OTHER): Payer: Self-pay

## 2021-03-22 MED ORDER — AMLODIPINE BESYLATE 10 MG PO TABS
10.0000 mg | ORAL_TABLET | Freq: Every day | ORAL | 3 refills | Status: DC
  Filled 2021-04-30: qty 30, 30d supply, fill #0

## 2021-03-22 MED ORDER — CARVEDILOL 12.5 MG PO TABS
ORAL_TABLET | ORAL | 0 refills | Status: DC
  Filled 2021-03-22 – 2021-04-30 (×2): qty 60, 30d supply, fill #0

## 2021-03-22 MED ORDER — HYDRALAZINE HCL 25 MG PO TABS
25.0000 mg | ORAL_TABLET | Freq: Three times a day (TID) | ORAL | 3 refills | Status: DC
  Filled 2021-04-30: qty 90, 30d supply, fill #0

## 2021-03-27 ENCOUNTER — Other Ambulatory Visit: Payer: Self-pay

## 2021-03-28 ENCOUNTER — Other Ambulatory Visit: Payer: Self-pay

## 2021-03-28 ENCOUNTER — Encounter: Payer: Self-pay | Admitting: Physician Assistant

## 2021-03-28 ENCOUNTER — Ambulatory Visit (INDEPENDENT_AMBULATORY_CARE_PROVIDER_SITE_OTHER): Payer: Self-pay | Admitting: Physician Assistant

## 2021-03-28 VITALS — BP 181/98 | HR 87 | Temp 98.4°F | Resp 20 | Ht 67.0 in | Wt 160.1 lb

## 2021-03-28 DIAGNOSIS — N186 End stage renal disease: Secondary | ICD-10-CM

## 2021-03-28 DIAGNOSIS — Z992 Dependence on renal dialysis: Secondary | ICD-10-CM

## 2021-03-28 NOTE — Progress Notes (Signed)
POST OPERATIVE OFFICE NOTE    CC:  F/u for surgery  HPI:  This is a 50 y.o. male who is s/p AV graft placement right forearm on 02/20/21 by Dr. Scot Dock.  He developed mild symptoms of steal post op of numbness in the fingers.  He denied pain or loss of motor.   Pt returns today for follow up.  Pt states he still has numbness and tingling in the right hand non specific nerve distribution.  He has an old injury to the right hand, but did think he had previous deficits.  He denise fever and chills  Allergies  Allergen Reactions   Sulfa Antibiotics Rash    Severe rash. Do not give sulfa medications   Bactrim [Sulfamethoxazole-Trimethoprim]     Rash   Cephalexin     Patient with severe drug reaction including exfoliating skin rash and hypotension after being prescribed both cephalexin and sulfamethoxazole-trimethoprim simultaneously. Favor SMX as much more likely culprit as patient had tolerated other cephalosporins in the past, but cannot say with complete certainty that this was not related to cephalexin.    Other     Patient states he is allergic to the TB test    Current Outpatient Medications  Medication Sig Dispense Refill   amLODipine (NORVASC) 10 MG tablet Take 1 tablet by mouth once a day 30 tablet 3   atorvastatin (LIPITOR) 40 MG tablet Take 1 tablet (40 mg total) by mouth daily. 30 tablet 0   Blood Glucose Monitoring Suppl (ACCU-CHEK GUIDE) w/Device KIT Use as directed 1 kit 0   Blood Glucose Monitoring Suppl (TRUE METRIX METER) w/Device KIT Use to measure blood sugar twice a day 1 kit 0   calcitRIOL (ROCALTROL) 0.25 MCG capsule Take 1 capsule (0.25 mcg total) by mouth daily. 30 capsule 0   carvedilol (COREG) 12.5 MG tablet Take 1 tablet (12.3m total) by mouth two times a day with a meal. 60 tablet 0   glucose blood test strip Use to check fasting blood sugar once daily. 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 Insulin dependent 100 each 12   hydrALAZINE (APRESOLINE) 25 MG tablet Take 1 tablet by  mouth three times a day 270 tablet 3   insulin aspart (NOVOLOG FLEXPEN) 100 UNIT/ML FlexPen Inject 5 Units into the skin 2 (two) times daily with a meal. 15 mL 2   insulin glargine (LANTUS) 100 UNIT/ML Solostar Pen Inject 25 Units into the skin daily. May substitute with any other long-acting formulary. 15 mL 0   Insulin Pen Needle 32G X 4 MM MISC Use with insulin pens 100 each 1   TRUEplus Lancets 28G MISC USE TO MEASURE BLOOD SUGAR TWICE A DAY 100 each 1   No current facility-administered medications for this visit.     ROS:  See HPI  Physical Exam:    Incision:  well healed Extremities:  right UE palpable radial pulse, palpable thrill in the graft. Grip 4+/5 on the right.  Cap refill brisk.  No ischemic changes to the hand.  Minimal local edema surrounding the graft.  Compartments soft     Assessment/Plan:  This is a 50y.o. male who is s/p:AV graft placement right forearm on 02/20/21 by Dr. DScot Dock  He developed mild symptoms of steal post op of numbness in the fingers.  He denied pain or loss of motor.    - He is not specific about the areas of numbness in the right UE.  I gave him a stress ball to use to increase  inflow demand to the right hand.  He will start graft use for HD and if he does not develop worsening symptoms he will be scheduled for [facility] removal in the future.  F/U as needed.   Roxy Horseman PA-C [facility] [phone]   Clinic MD:  Donzetta Matters

## 2021-04-08 ENCOUNTER — Other Ambulatory Visit: Payer: Self-pay

## 2021-04-08 ENCOUNTER — Encounter (HOSPITAL_COMMUNITY): Payer: Self-pay

## 2021-04-08 ENCOUNTER — Emergency Department (HOSPITAL_COMMUNITY)
Admission: EM | Admit: 2021-04-08 | Discharge: 2021-04-08 | Disposition: A | Discharge status: Home / Self Care | Payer: Medicaid Other | Attending: Emergency Medicine | Admitting: Emergency Medicine

## 2021-04-08 ENCOUNTER — Emergency Department (HOSPITAL_COMMUNITY): Payer: Medicaid Other

## 2021-04-08 DIAGNOSIS — Z9115 Patient's noncompliance with renal dialysis: Secondary | ICD-10-CM | POA: Insufficient documentation

## 2021-04-08 DIAGNOSIS — R079 Chest pain, unspecified: Secondary | ICD-10-CM | POA: Diagnosis present

## 2021-04-08 DIAGNOSIS — R778 Other specified abnormalities of plasma proteins: Secondary | ICD-10-CM | POA: Diagnosis not present

## 2021-04-08 DIAGNOSIS — I132 Hypertensive heart and chronic kidney disease with heart failure and with stage 5 chronic kidney disease, or end stage renal disease: Secondary | ICD-10-CM | POA: Diagnosis not present

## 2021-04-08 DIAGNOSIS — Z7984 Long term (current) use of oral hypoglycemic drugs: Secondary | ICD-10-CM | POA: Diagnosis not present

## 2021-04-08 DIAGNOSIS — I2510 Atherosclerotic heart disease of native coronary artery without angina pectoris: Secondary | ICD-10-CM | POA: Diagnosis not present

## 2021-04-08 DIAGNOSIS — I5041 Acute combined systolic (congestive) and diastolic (congestive) heart failure: Secondary | ICD-10-CM | POA: Insufficient documentation

## 2021-04-08 DIAGNOSIS — Z794 Long term (current) use of insulin: Secondary | ICD-10-CM | POA: Insufficient documentation

## 2021-04-08 DIAGNOSIS — E1142 Type 2 diabetes mellitus with diabetic polyneuropathy: Secondary | ICD-10-CM | POA: Diagnosis not present

## 2021-04-08 DIAGNOSIS — E1122 Type 2 diabetes mellitus with diabetic chronic kidney disease: Secondary | ICD-10-CM | POA: Diagnosis not present

## 2021-04-08 DIAGNOSIS — N186 End stage renal disease: Secondary | ICD-10-CM | POA: Diagnosis not present

## 2021-04-08 DIAGNOSIS — Z79899 Other long term (current) drug therapy: Secondary | ICD-10-CM | POA: Insufficient documentation

## 2021-04-08 LAB — CBC
HCT: 36 % — ABNORMAL LOW (ref 39.0–52.0)
Hemoglobin: 10.6 g/dL — ABNORMAL LOW (ref 13.0–17.0)
MCH: 26.5 pg (ref 26.0–34.0)
MCHC: 29.4 g/dL — ABNORMAL LOW (ref 30.0–36.0)
MCV: 90 fL (ref 80.0–100.0)
Platelets: 367 10*3/uL (ref 150–400)
RBC: 4 MIL/uL — ABNORMAL LOW (ref 4.22–5.81)
RDW: 14.7 % (ref 11.5–15.5)
WBC: 5.2 10*3/uL (ref 4.0–10.5)
nRBC: 0 % (ref 0.0–0.2)

## 2021-04-08 LAB — COMPREHENSIVE METABOLIC PANEL
ALT: 23 U/L (ref 0–44)
AST: 27 U/L (ref 15–41)
Albumin: 2.2 g/dL — ABNORMAL LOW (ref 3.5–5.0)
Alkaline Phosphatase: 148 U/L — ABNORMAL HIGH (ref 38–126)
Anion gap: 7 (ref 5–15)
BUN: 52 mg/dL — ABNORMAL HIGH (ref 6–20)
CO2: 24 mmol/L (ref 22–32)
Calcium: 6.9 mg/dL — ABNORMAL LOW (ref 8.9–10.3)
Chloride: 109 mmol/L (ref 98–111)
Creatinine, Ser: 7.6 mg/dL — ABNORMAL HIGH (ref 0.61–1.24)
GFR, Estimated: 8 mL/min — ABNORMAL LOW (ref >60)
Glucose, Bld: 140 mg/dL — ABNORMAL HIGH (ref 70–99)
Potassium: 4.8 mmol/L (ref 3.5–5.1)
Sodium: 140 mmol/L (ref 135–145)
Total Bilirubin: 0.4 mg/dL (ref 0.3–1.2)
Total Protein: 6.4 g/dL — ABNORMAL LOW (ref 6.5–8.1)

## 2021-04-08 LAB — TROPONIN I (HIGH SENSITIVITY)
Troponin I (High Sensitivity): 70 ng/L — ABNORMAL HIGH (ref <18)
Troponin I (High Sensitivity): 76 ng/L — ABNORMAL HIGH (ref <18)

## 2021-04-08 MED ORDER — CLONIDINE HCL 0.1 MG PO TABS
0.1000 mg | ORAL_TABLET | Freq: Once | ORAL | Status: AC
  Administered 2021-04-08: 0.1 mg via ORAL
  Filled 2021-04-08: qty 1

## 2021-04-08 NOTE — ED Provider Notes (Addendum)
[facility] EMERGENCY DEPARTMENT Provider Note   CSN: [id number] Arrival date & time: 04/08/21  1438     History Chief Complaint  Patient presents with  . Chest Pain    [name] is a 50 y.o. male.  HPI 50 year old male end-stage renal disease on dialysis, history of type 2 diabetes, coronary artery disease, CHF, presents today complaining of sharp left-sided chest pain.  He has not been to dialysis for the past week.  He states that he is not feeling dyspneic.  He began having chest pain about an hour and a half prior to arrival.  It was sharp in nature.  He took aspirin.  It is since resolved.  It was moderate in nature.  He denies cough, fever, nausea, vomiting, diarrhea.  Expresses that he wishes that he could have peritoneal dialysis instead of dialysis.    Past Medical History:  Diagnosis Date  . Anxiety 02/2019  . Anxiety 10/2019  . Diabetes mellitus type II, uncontrolled 07/17/2006  . Elevated alkaline phosphatase level 01/2019  . ESSENTIAL HYPERTENSION 07/17/2006  . HYPERLIPIDEMIA 11/24/2008  . Insomnia 02/2019  . Left bundle branch block 02/22/2019  . Lesion of lip 10/2019  . Pneumonia 10/22/2011  . Suicidal ideations 02/2019  . Vitamin D deficiency 01/2019    Patient Active Problem List   Diagnosis Date Noted  . Mild protein-calorie malnutrition ([place]) 03/06/2021  . Aftercare including intermittent dialysis ([place]) 02/27/2021  . Allergy, unspecified, initial encounter 02/27/2021  . Anaphylactic shock, unspecified, initial encounter 02/27/2021  . Anemia in chronic kidney disease 02/27/2021  . Diarrhea, unspecified 02/27/2021  . End stage renal disease ([place]) 02/27/2021  . 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 Pruritus, unspecified 02/27/2021  . Secondary hyperparathyroidism of renal origin ([place]) 02/27/2021  . Shortness of breath 02/27/2021  . Fever, unspecified 02/27/2021  . Type 2 diabetes mellitus with other diabetic  kidney complication ([place]) 78/29/5621  . Hypertensive urgency 02/18/2021  . Type 2 diabetes mellitus ([place]) 02/17/2021  . Acute combined systolic and diastolic congestive heart failure ([place]) 11/29/2020  . Hypertensive crisis 11/27/2020  . Lower extremity edema 03/07/2020  . Stage 4 chronic kidney disease ([place]) 02/22/2020  . Left ventricular dysfunction 04/27/2019  . Hemoglobin A1C greater than 9%, indicating poor diabetic control 07/29/2018  . New onset left bundle branch block (LBBB)   . 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 Hyperlipidemia LDL goal <70 11/24/2008  . Uncontrolled type 2 diabetes mellitus with peripheral neuropathy 07/17/2006  . Essential hypertension 07/17/2006    Past Surgical History:  Procedure Laterality Date  . ARM SURGERY    . AV FISTULA PLACEMENT Right 02/20/2021   Procedure: RIGHT ARTERIOVENOUS GRAFT CREATION;  Surgeon: Angelia Mould, MD;  Location: [facility];  Service: Vascular;  Laterality: Right;  . INSERTION OF DIALYSIS CATHETER Left 02/20/2021   Procedure: INSERTION OF DIALYSIS CATHETER USING PALINDROME CATHETER;  Surgeon: Angelia Mould, MD;  Location: [facility];  Service: Vascular;  Laterality: Left;  . ULTRASOUND GUIDANCE FOR VASCULAR ACCESS  02/20/2021   Procedure: ULTRASOUND GUIDANCE FOR VASCULAR ACCESS;  Surgeon: Angelia Mould, MD;  Location: [facility];  Service: Vascular;;       Family History  Problem Relation Age of Onset  . Hypertension Mother 57  . Cancer Mother        bladder cancer  . Alcohol abuse Father   . Hypertension Brother     Social History   Tobacco Use  .  Smoking status: Never  . Smokeless tobacco: Never  Vaping Use  . Vaping Use: Never used  Substance Use Topics  . Alcohol use: No    Alcohol/week: 0.0 standard drinks  . Drug use: No    Home Medications Prior to Admission medications   Medication Sig Start Date End Date Taking? Authorizing Provider  amLODipine (NORVASC) 10 MG tablet Take  1 tablet by mouth once a day 03/22/21  Yes   atorvastatin (LIPITOR) 40 MG tablet Take 1 tablet (40 mg total) by mouth daily. 02/26/21  Yes [name], [name], MD  calcitRIOL (ROCALTROL) 0.25 MCG capsule Take 1 capsule (0.25 mcg total) by mouth daily. 02/26/21  Yes [name], [name], MD  carvedilol (COREG) 12.5 MG tablet Take 1 tablet (12.61m total) by mouth two times a day with a meal. 03/22/21  Yes   Cholecalciferol (VITAMIN D HIGH POTENCY PO) Take by mouth. 03/08/21 03/05/22 Yes [provider]  hydrALAZINE (APRESOLINE) 25 MG tablet Take 1 tablet by mouth three times a day 03/22/21  Yes   insulin aspart (NOVOLOG FLEXPEN) 100 UNIT/ML FlexPen Inject 5 Units into the skin 2 (two) times daily with a meal. 02/26/21  Yes [name], DSilver Huguenin MD  insulin glargine (LANTUS) 100 UNIT/ML Solostar Pen Inject 25 Units into the skin daily. May substitute with any other long-acting formulary. 02/26/21  Yes [name], DSilver Huguenin MD  Blood Glucose Monitoring Suppl (ACCU-CHEK GUIDE) w/Device KIT Use as directed 11/29/20   KAnnita Brod MD  Blood Glucose Monitoring Suppl (TRUE METRIX METER) w/Device KIT Use to measure blood sugar twice a day 02/22/20   WElsie Stain MD  glucose blood test strip Use to check fasting blood sugar once daily. 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 Insulin dependent 11/29/20   KAnnita Brod MD  heparin 1000 unit/mL SOLN injection Heparin Sodium (Porcine) 1,000 Units/mL Catheter Lock Arterial 02/27/21 02/26/22  [provider]  Insulin Pen Needle 32G X 4 MM MISC Use with insulin pens 02/26/21   [name], DSilver Huguenin MD  Methoxy PEG-Epoetin Beta (MIRCERA IJ) Mircera 03/08/21 03/07/22  [provider]  TRUEplus Lancets 28G MISC USE TO MEASURE BLOOD SUGAR TWICE A DAY 11/29/20 11/29/21  KAnnita Brod MD  gabapentin (NEURONTIN) 300 MG capsule Take 1 capsule (300 mg total) by mouth 3 (three) times daily. Patient not taking: Reported on 07/27/2019 07/29/18 08/30/19  SAzzie Glatter FNP  insulin detemir (LEVEMIR FLEXTOUCH) 100 UNIT/ML FlexPen Inject 35 Units into the skin daily. Patient not taking: Reported on 02/17/2021 11/29/20 02/24/21  KAnnita Brod MD  sildenafil (VIAGRA) 25 MG tablet Take 1 tablet (25 mg total) by mouth as needed for erectile dysfunction. for erectile dysfunction. Patient not taking: Reported on 08/11/2020 10/13/19 11/27/20  SAzzie Glatter FNP  sitaGLIPtin (JANUVIA) 25 MG tablet Take 1 tablet (25 mg total) by mouth daily. Patient not taking: Reported on 08/11/2020 02/22/20 11/27/20  WElsie Stain MD    Allergies    Sulfa antibiotics, Bactrim [sulfamethoxazole-trimethoprim], Cephalexin, and Other  Review of Systems   Review of Systems  All other systems reviewed and are negative.  Physical Exam Updated Vital Signs BP (!) 144/89   Pulse 87   Temp 98.4 F (36.9 C) (Oral)   Resp 14   Ht 1.702 m (5' 7" )   Wt 67.1 kg   SpO2 (!) 86%   BMI 23.18 kg/m   Physical Exam Vitals reviewed.  Constitutional:      General: He is not in acute distress.  Appearance: He is well-developed.  HENT:     Head: Normocephalic.  Eyes:     Pupils: Pupils are equal, round, and reactive to light.  Cardiovascular:     Rate and Rhythm: Normal rate and regular rhythm.     Heart sounds: Normal heart sounds.  Pulmonary:     Effort: Pulmonary effort is normal.     Breath sounds: Normal breath sounds.  Chest:     Comments: Left-sided chest dialysis catheter Abdominal:     General: Bowel sounds are normal.     Palpations: Abdomen is soft.  Musculoskeletal:     Cervical back: Normal range of motion.     Right lower leg: No tenderness. Edema present.     Left lower leg: No tenderness. Edema present.  Skin:    General: Skin is warm and dry.     Capillary Refill: Capillary refill takes less than 2 seconds.     Comments: Right forearm with dialysis graft placement with well-healing wounds.  No thrill noted at this time  Neurological:      General: No focal deficit present.     Mental Status: He is alert.  Psychiatric:        Mood and Affect: Mood normal.    ED Results / Procedures / Treatments   Labs (all labs ordered are listed, but only abnormal results are displayed) Labs Reviewed  CBC - Abnormal; Notable for the following components:      Result Value   RBC 4.00 (*)    Hemoglobin 10.6 (*)    HCT 36.0 (*)    MCHC 29.4 (*)    All other components within normal limits  COMPREHENSIVE METABOLIC PANEL - Abnormal; Notable for the following components:   Glucose, Bld 140 (*)    BUN 52 (*)    Creatinine, Ser 7.60 (*)    Calcium 6.9 (*)    Total Protein 6.4 (*)    Albumin 2.2 (*)    Alkaline Phosphatase 148 (*)    GFR, Estimated 8 (*)    All other components within normal limits  TROPONIN I (HIGH SENSITIVITY) - Abnormal; Notable for the following components:   Troponin I (High Sensitivity) 70 (*)    All other components within normal limits  TROPONIN I (HIGH SENSITIVITY) - Abnormal; Notable for the following components:   Troponin I (High Sensitivity) 76 (*)    All other components within normal limits  RESP PANEL BY RT-PCR (FLU A&B, COVID) [id number]    EKG EKG Interpretation  Date/Time:  Sunday April 08 2021 14:47:33 EST Ventricular Rate:  92 PR Interval:  140 QRS Duration: 147 QT Interval:  442 QTC Calculation: 547 R Axis:   -26 Text Interpretation: Sinus rhythm Left bundle branch block No significant change since last tracing Confirmed by Pattricia Boss [phone]) on 04/08/2021 3:06:42 PM  Radiology DG Chest Port 1 View  Result Date: 04/08/2021 CLINICAL DATA:  Chest pain EXAM: PORTABLE CHEST 1 VIEW COMPARISON:  03/13/2021 FINDINGS: Cardiomegaly. Left chest large bore multi lumen vascular catheter. Mild, diffuse bilateral interstitial pulmonary opacity. IMPRESSION: Cardiomegaly with mild, diffuse bilateral interstitial pulmonary opacity, likely edema. No focal airspace opacity. Electronically Signed   By:  Delanna Ahmadi M.D.   On: 04/08/2021 16:11    Procedures Procedures   Medications Ordered in ED Medications  cloNIDine (CATAPRES) tablet 0.1 mg (0.1 mg Oral Given 04/08/21 1732)    ED Course  I have reviewed the triage vital signs and the nursing notes.  Pertinent labs &  imaging results that were available during my care of the patient were reviewed by me and considered in my medical decision making (see chart for details).  Clinical Course as of 04/08/21 1859  Nancy Fetter Apr 08, 2021  1859 With satellite areas present.  I went in and checked on patient sats are up to 98%.  I suspect his waistline. [DR]    Clinical Course User Index [DR] Pattricia Boss, MD   MDM Rules/Calculators/A&P                          Presents today with missed dialysis for a week and episodic chest pain that lasted approximately an hour today.  EKG is unchanged.  Patient has chronically elevated troponins and these are today at 70 and 76 heart assistant with prior elevated troponins.  He has been pain-free here.  Patient advised to follow-up with cardiology and return if he has return of chest pain. Discussed care with Dr. Jonnie Finner  Patient appears stable here.  He is not appear particularly volume overloaded and electrolytes are normal.  Advises that patient should call dialysis center in the morning.  Dr. Jonnie Finner to also contact his PA to help to coordinate this Final Clinical Impression(s) / ED Diagnoses Final diagnoses:  Chest pain, unspecified type  Elevated troponin level  ESRD (end stage renal disease) ([place])  Noncompliance with renal dialysis [facility])    Rx / DC Orders ED Discharge Orders     None        Pattricia Boss, MD 04/08/21 Paulette Blanch    Pattricia Boss, MD 04/08/21 1900

## 2021-04-08 NOTE — Discharge Instructions (Addendum)
Call your dialysis center tomorrow to see if they can get you in for dialysis tomorrow. Return to the emergency department if you are having sudden worsening shortness of breath or return of your chest pain Your EKG does not look like a heart attack today.  You have a left bundle branch which makes it somewhat difficult to interpret.  However, your heart enzymes have been chronically elevated and do not look any more elevated today than in the past.

## 2021-04-08 NOTE — ED Triage Notes (Signed)
Pt arrived via [facility] from home forr c/o of left sided chest pain w/out radiation. PTA pt given 324mg  ASA and IV access established to left hand. Pt presents A&Ox4 and states CP is a 1/10 at this time. Pt does not endorse any SOB. Pt has hx of dialysis (T/Th/Sat) and missed dialysis last week. Pt also has hx of MI back in July of 2022. Vitals per medic: BP: 210/100, HR: 90, SpO2: 90%, and CBG:180

## 2021-04-11 ENCOUNTER — Inpatient Hospital Stay (HOSPITAL_COMMUNITY)
Admission: EM | Admit: 2021-04-11 | Discharge: 2021-04-16 | DRG: 682 | Disposition: A | Discharge status: Home / Self Care | Payer: Medicaid Other | Attending: Family Medicine | Admitting: Family Medicine

## 2021-04-11 ENCOUNTER — Emergency Department (HOSPITAL_COMMUNITY): Payer: Medicaid Other

## 2021-04-11 ENCOUNTER — Encounter (HOSPITAL_COMMUNITY): Payer: Self-pay

## 2021-04-11 DIAGNOSIS — R079 Chest pain, unspecified: Secondary | ICD-10-CM

## 2021-04-11 DIAGNOSIS — E119 Type 2 diabetes mellitus without complications: Secondary | ICD-10-CM

## 2021-04-11 DIAGNOSIS — E8770 Fluid overload, unspecified: Secondary | ICD-10-CM | POA: Diagnosis present

## 2021-04-11 DIAGNOSIS — I447 Left bundle-branch block, unspecified: Secondary | ICD-10-CM | POA: Diagnosis present

## 2021-04-11 DIAGNOSIS — M898X9 Other specified disorders of bone, unspecified site: Secondary | ICD-10-CM | POA: Diagnosis present

## 2021-04-11 DIAGNOSIS — I10 Essential (primary) hypertension: Secondary | ICD-10-CM | POA: Diagnosis present

## 2021-04-11 DIAGNOSIS — E559 Vitamin D deficiency, unspecified: Secondary | ICD-10-CM | POA: Diagnosis present

## 2021-04-11 DIAGNOSIS — I517 Cardiomegaly: Secondary | ICD-10-CM | POA: Diagnosis present

## 2021-04-11 DIAGNOSIS — J9602 Acute respiratory failure with hypercapnia: Secondary | ICD-10-CM

## 2021-04-11 DIAGNOSIS — R0789 Other chest pain: Secondary | ICD-10-CM | POA: Diagnosis present

## 2021-04-11 DIAGNOSIS — E785 Hyperlipidemia, unspecified: Secondary | ICD-10-CM | POA: Diagnosis present

## 2021-04-11 DIAGNOSIS — Z992 Dependence on renal dialysis: Secondary | ICD-10-CM

## 2021-04-11 DIAGNOSIS — Z8249 Family history of ischemic heart disease and other diseases of the circulatory system: Secondary | ICD-10-CM

## 2021-04-11 DIAGNOSIS — Z79899 Other long term (current) drug therapy: Secondary | ICD-10-CM

## 2021-04-11 DIAGNOSIS — R110 Nausea: Secondary | ICD-10-CM | POA: Diagnosis not present

## 2021-04-11 DIAGNOSIS — F32A Depression, unspecified: Secondary | ICD-10-CM | POA: Diagnosis present

## 2021-04-11 DIAGNOSIS — I252 Old myocardial infarction: Secondary | ICD-10-CM

## 2021-04-11 DIAGNOSIS — I120 Hypertensive chronic kidney disease with stage 5 chronic kidney disease or end stage renal disease: Principal | ICD-10-CM | POA: Diagnosis present

## 2021-04-11 DIAGNOSIS — Z794 Long term (current) use of insulin: Secondary | ICD-10-CM

## 2021-04-11 DIAGNOSIS — Z9115 Patient's noncompliance with renal dialysis: Secondary | ICD-10-CM

## 2021-04-11 DIAGNOSIS — F419 Anxiety disorder, unspecified: Secondary | ICD-10-CM | POA: Diagnosis present

## 2021-04-11 DIAGNOSIS — Z888 Allergy status to other drugs, medicaments and biological substances status: Secondary | ICD-10-CM

## 2021-04-11 DIAGNOSIS — Z9114 Patient's other noncompliance with medication regimen: Secondary | ICD-10-CM

## 2021-04-11 DIAGNOSIS — G4700 Insomnia, unspecified: Secondary | ICD-10-CM | POA: Diagnosis present

## 2021-04-11 DIAGNOSIS — Z882 Allergy status to sulfonamides status: Secondary | ICD-10-CM

## 2021-04-11 DIAGNOSIS — N186 End stage renal disease: Secondary | ICD-10-CM

## 2021-04-11 DIAGNOSIS — I160 Hypertensive urgency: Secondary | ICD-10-CM | POA: Diagnosis present

## 2021-04-11 DIAGNOSIS — Z881 Allergy status to other antibiotic agents status: Secondary | ICD-10-CM

## 2021-04-11 DIAGNOSIS — Z20822 Contact with and (suspected) exposure to covid-19: Secondary | ICD-10-CM | POA: Diagnosis present

## 2021-04-11 DIAGNOSIS — D649 Anemia, unspecified: Secondary | ICD-10-CM | POA: Diagnosis present

## 2021-04-11 DIAGNOSIS — N2581 Secondary hyperparathyroidism of renal origin: Secondary | ICD-10-CM | POA: Diagnosis present

## 2021-04-11 DIAGNOSIS — Z9151 Personal history of suicidal behavior: Secondary | ICD-10-CM

## 2021-04-11 DIAGNOSIS — R600 Localized edema: Secondary | ICD-10-CM | POA: Diagnosis present

## 2021-04-11 DIAGNOSIS — E1122 Type 2 diabetes mellitus with diabetic chronic kidney disease: Secondary | ICD-10-CM | POA: Diagnosis present

## 2021-04-11 DIAGNOSIS — R197 Diarrhea, unspecified: Secondary | ICD-10-CM | POA: Diagnosis not present

## 2021-04-11 DIAGNOSIS — Z91199 Patient's noncompliance with other medical treatment and regimen due to unspecified reason: Secondary | ICD-10-CM

## 2021-04-11 LAB — COMPREHENSIVE METABOLIC PANEL
ALT: 29 U/L (ref 0–44)
AST: 37 U/L (ref 15–41)
Albumin: 2.2 g/dL — ABNORMAL LOW (ref 3.5–5.0)
Alkaline Phosphatase: 150 U/L — ABNORMAL HIGH (ref 38–126)
Anion gap: 10 (ref 5–15)
BUN: 59 mg/dL — ABNORMAL HIGH (ref 6–20)
CO2: 21 mmol/L — ABNORMAL LOW (ref 22–32)
Calcium: 6.6 mg/dL — ABNORMAL LOW (ref 8.9–10.3)
Chloride: 106 mmol/L (ref 98–111)
Creatinine, Ser: 6.89 mg/dL — ABNORMAL HIGH (ref 0.61–1.24)
GFR, Estimated: 9 mL/min — ABNORMAL LOW (ref >60)
Glucose, Bld: 176 mg/dL — ABNORMAL HIGH (ref 70–99)
Potassium: 4 mmol/L (ref 3.5–5.1)
Sodium: 137 mmol/L (ref 135–145)
Total Bilirubin: 0.5 mg/dL (ref 0.3–1.2)
Total Protein: 6 g/dL — ABNORMAL LOW (ref 6.5–8.1)

## 2021-04-11 LAB — CBC
HCT: 35.5 % — ABNORMAL LOW (ref 39.0–52.0)
Hemoglobin: 10.5 g/dL — ABNORMAL LOW (ref 13.0–17.0)
MCH: 26.5 pg (ref 26.0–34.0)
MCHC: 29.6 g/dL — ABNORMAL LOW (ref 30.0–36.0)
MCV: 89.6 fL (ref 80.0–100.0)
Platelets: 362 10*3/uL (ref 150–400)
RBC: 3.96 MIL/uL — ABNORMAL LOW (ref 4.22–5.81)
RDW: 14.6 % (ref 11.5–15.5)
WBC: 6.9 10*3/uL (ref 4.0–10.5)
nRBC: 0 % (ref 0.0–0.2)

## 2021-04-11 LAB — TROPONIN I (HIGH SENSITIVITY)
Troponin I (High Sensitivity): 92 ng/L — ABNORMAL HIGH (ref <18)
Troponin I (High Sensitivity): 97 ng/L — ABNORMAL HIGH (ref <18)

## 2021-04-11 LAB — LIPASE, BLOOD: Lipase: 29 U/L (ref 11–51)

## 2021-04-11 MED ORDER — HYDRALAZINE HCL 25 MG PO TABS
25.0000 mg | ORAL_TABLET | Freq: Once | ORAL | Status: AC
  Administered 2021-04-12: 25 mg via ORAL
  Filled 2021-04-11: qty 1

## 2021-04-11 MED ORDER — ALUM & MAG HYDROXIDE-SIMETH 200-200-20 MG/5ML PO SUSP
30.0000 mL | Freq: Once | ORAL | Status: AC
  Administered 2021-04-12: 30 mL via ORAL
  Filled 2021-04-11: qty 30

## 2021-04-11 MED ORDER — CARVEDILOL 12.5 MG PO TABS
12.5000 mg | ORAL_TABLET | Freq: Once | ORAL | Status: AC
  Administered 2021-04-12: 12.5 mg via ORAL
  Filled 2021-04-11: qty 1

## 2021-04-11 MED ORDER — LIDOCAINE VISCOUS HCL 2 % MT SOLN
15.0000 mL | Freq: Once | OROMUCOSAL | Status: AC
  Administered 2021-04-12: 15 mL via ORAL
  Filled 2021-04-11: qty 15

## 2021-04-11 MED ORDER — AMLODIPINE BESYLATE 5 MG PO TABS
5.0000 mg | ORAL_TABLET | Freq: Once | ORAL | Status: AC
  Administered 2021-04-12: 5 mg via ORAL
  Filled 2021-04-11: qty 1

## 2021-04-11 NOTE — ED Provider Notes (Signed)
[facility] EMERGENCY DEPARTMENT Provider Note   CSN: [id number] Arrival date & time: 04/11/21  2014     History Chief Complaint  Patient presents with   Chest Pain    [name] is a 50 y.o. male.  Patient with a history of ESRD on dialysis for the past 2 months, hypertension, hyperlipidemia, noncompliance here with central chest pain.  States pain has been ongoing for the past 2 days in the center of his chest and is constant.  [name] was seen in the ED on November 20 for similar symptoms.  States the pain never really resolved.  Pain is in the center of his chest and does not radiate.  Some associated shortness of breath.  No cough or fever.  No nausea or vomiting or abdominal pain.  States [name] has not been to dialysis for 2 weeks due to transportation issues.  [name] is supposed to go on Tuesdays, Thursdays and Saturdays.  [name] is also not had his medications for 2 weeks due to a problem with his pillbox.  [name] states that [name] returned because the chest pain is getting worse and [name] feels more short of breath.  No vomiting.  No diaphoresis. [name] has an AV fistula to his left forearm that is maturing but [name] currently dialyzes through his left chest catheter States [name] has had a heart attack before but denies having any stents in his heart  The history is provided by the patient and the EMS personnel.  Chest Pain Associated symptoms: shortness of breath   Associated symptoms: no abdominal pain, no back pain, no dizziness, no fatigue, no fever, no headache, no nausea, no vomiting and no weakness       Past Medical History:  Diagnosis Date   Anxiety 02/2019   Anxiety 10/2019   Diabetes mellitus type II, uncontrolled 07/17/2006   Elevated alkaline phosphatase level 01/2019   ESSENTIAL HYPERTENSION 07/17/2006   HYPERLIPIDEMIA 11/24/2008   Insomnia 02/2019   Left bundle branch block 02/22/2019   Lesion of lip 10/2019   Pneumonia 10/22/2011   Suicidal ideations 02/2019   Vitamin  D deficiency 01/2019    Patient Active Problem List   Diagnosis Date Noted   Mild protein-calorie malnutrition ([place]) 03/06/2021   Aftercare including intermittent dialysis ([place]) 02/27/2021   Allergy, unspecified, initial encounter 02/27/2021   Anaphylactic shock, unspecified, initial encounter 02/27/2021   Anemia in chronic kidney disease 02/27/2021   Diarrhea, unspecified 02/27/2021   End stage renal disease ([place]) 02/27/2021   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   Pruritus, unspecified 02/27/2021   Secondary hyperparathyroidism of renal origin ([place]) 02/27/2021   Shortness of breath 02/27/2021   Fever, unspecified 02/27/2021   Type 2 diabetes mellitus with other diabetic kidney complication ([place]) 73/41/9379   Hypertensive urgency 02/18/2021   Type 2 diabetes mellitus ([place]) 02/17/2021   Acute combined systolic and diastolic congestive heart failure ([place]) 11/29/2020   Hypertensive crisis 11/27/2020   Lower extremity edema 03/07/2020   Stage 4 chronic kidney disease ([place]) 02/22/2020   Left ventricular dysfunction 04/27/2019   Hemoglobin A1C greater than 9%, indicating poor diabetic control 07/29/2018   New onset left bundle branch block (LBBB)    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   Hyperlipidemia LDL goal <70 11/24/2008   Uncontrolled type 2 diabetes mellitus with peripheral neuropathy 07/17/2006   Essential hypertension 07/17/2006    Past Surgical History:  Procedure Laterality Date   ARM SURGERY     AV  FISTULA PLACEMENT Right 02/20/2021   Procedure: RIGHT ARTERIOVENOUS GRAFT CREATION;  Surgeon: Angelia Mould, MD;  Location: [facility];  Service: Vascular;  Laterality: Right;   INSERTION OF DIALYSIS CATHETER Left 02/20/2021   Procedure: INSERTION OF DIALYSIS CATHETER USING PALINDROME CATHETER;  Surgeon: Angelia Mould, MD;  Location: [facility] OR;  Service: Vascular;  Laterality: Left;   ULTRASOUND GUIDANCE FOR VASCULAR ACCESS  02/20/2021    Procedure: ULTRASOUND GUIDANCE FOR VASCULAR ACCESS;  Surgeon: Angelia Mould, MD;  Location: [facility] OR;  Service: Vascular;;       Family History  Problem Relation Age of Onset   Hypertension Mother 3   Cancer Mother        bladder cancer   Alcohol abuse Father    Hypertension Brother     Social History   Tobacco Use   Smoking status: Never   Smokeless tobacco: Never  Vaping Use   Vaping Use: Never used  Substance Use Topics   Alcohol use: No    Alcohol/week: 0.0 standard drinks   Drug use: No    Home Medications Prior to Admission medications   Medication Sig Start Date End Date Taking? Authorizing Provider  amLODipine (NORVASC) 10 MG tablet Take 1 tablet by mouth once a day 03/22/21     atorvastatin (LIPITOR) 40 MG tablet Take 1 tablet (40 mg total) by mouth daily. 02/26/21   [name], [name], MD  Blood Glucose Monitoring Suppl (ACCU-CHEK GUIDE) w/Device KIT Use as directed 11/29/20   [name], MD  Blood Glucose Monitoring Suppl (TRUE METRIX METER) w/Device KIT Use to measure blood sugar twice a day 02/22/20   [name], MD  calcitRIOL (ROCALTROL) 0.25 MCG capsule Take 1 capsule (0.25 mcg total) by mouth daily. 02/26/21   [name], [name], MD  carvedilol (COREG) 12.5 MG tablet Take 1 tablet (12.39m total) by mouth two times a day with a meal. 03/22/21     Cholecalciferol (VITAMIN D HIGH POTENCY PO) Take by mouth. 03/08/21 03/05/22  [provider]  glucose blood test strip Use to check fasting blood sugar once daily. 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 Insulin dependent 11/29/20   KAnnita Brod MD  heparin 1000 unit/mL SOLN injection Heparin Sodium (Porcine) 1,000 Units/mL Catheter Lock Arterial 02/27/21 02/26/22  [provider]  hydrALAZINE (APRESOLINE) 25 MG tablet Take 1 tablet by mouth three times a day 03/22/21     insulin aspart (NOVOLOG FLEXPEN) 100 UNIT/ML FlexPen Inject 5 Units into the skin 2 (two) times daily with a meal. 02/26/21    [name], DSilver Huguenin MD  insulin glargine (LANTUS) 100 UNIT/ML Solostar Pen Inject 25 Units into the skin daily. May substitute with any other long-acting formulary. 02/26/21   [name], DSilver Huguenin MD  Insulin Pen Needle 32G X 4 MM MISC Use with insulin pens 02/26/21   [name], DSilver Huguenin MD  Methoxy PEG-Epoetin Beta (MIRCERA IJ) Mircera 03/08/21 03/07/22  [provider]  TRUEplus Lancets 28G MISC USE TO MEASURE BLOOD SUGAR TWICE A DAY 11/29/20 11/29/21  KAnnita Brod MD  gabapentin (NEURONTIN) 300 MG capsule Take 1 capsule (300 mg total) by mouth 3 (three) times daily. Patient not taking: Reported on 07/27/2019 07/29/18 08/30/19  SAzzie Glatter FNP  insulin detemir (LEVEMIR FLEXTOUCH) 100 UNIT/ML FlexPen Inject 35 Units into the skin daily. Patient not taking: Reported on 02/17/2021 11/29/20 02/24/21  KAnnita Brod MD  sildenafil (VIAGRA) 25 MG tablet Take 1 tablet (25 mg total) by mouth as needed  for erectile dysfunction. for erectile dysfunction. Patient not taking: Reported on 08/11/2020 10/13/19 11/27/20  [name], FNP  sitaGLIPtin (JANUVIA) 25 MG tablet Take 1 tablet (25 mg total) by mouth daily. Patient not taking: Reported on 08/11/2020 02/22/20 11/27/20  [name], MD    Allergies    Sulfa antibiotics, Bactrim [sulfamethoxazole-trimethoprim], Cephalexin, and Other  Review of Systems   Review of Systems  Constitutional:  Negative for activity change, appetite change, fatigue and fever.  HENT:  Negative for congestion and sore throat.   Respiratory:  Positive for chest tightness and shortness of breath.   Cardiovascular:  Positive for chest pain.  Gastrointestinal:  Negative for abdominal pain, nausea and vomiting.  Genitourinary:  Negative for dysuria and hematuria.  Musculoskeletal:  Negative for arthralgias, back pain and myalgias.  Neurological:  Negative for dizziness, weakness and headaches.   all other systems are negative except as noted in the  HPI and PMH.   Physical Exam Updated Vital Signs BP (!) 181/101 (BP Location: Left Arm)   Pulse 92   Temp 98 F (36.7 C) (Oral)   Resp 16   SpO2 95%   Physical Exam Vitals and nursing note reviewed.  Constitutional:      General: [name] is not in acute distress.    Appearance: [name] is well-developed.  HENT:     Head: Normocephalic and atraumatic.     Mouth/Throat:     Pharynx: No oropharyngeal exudate.  Eyes:     Conjunctiva/sclera: Conjunctivae normal.     Pupils: Pupils are equal, round, and reactive to light.  Neck:     Comments: No meningismus. Cardiovascular:     Rate and Rhythm: Normal rate and regular rhythm.     Heart sounds: Normal heart sounds. No murmur heard. Pulmonary:     Effort: Pulmonary effort is normal. No respiratory distress.     Breath sounds: Normal breath sounds.     Comments: Dialysis catheter left chest Chest:     Chest wall: No tenderness.  Abdominal:     Palpations: Abdomen is soft.     Tenderness: There is no abdominal tenderness. There is no guarding or rebound.  Musculoskeletal:        General: Tenderness present. Normal range of motion.     Cervical back: Normal range of motion and neck supple.     Comments: Left forearm edema over site of recently constructed AV fistula.  No overlying warmth or erythema.  Thrill intact  Skin:    General: Skin is warm.  Neurological:     Mental Status: [name] is alert and oriented to person, place, and time.     Cranial Nerves: No cranial nerve deficit.     Motor: No abnormal muscle tone.     Coordination: Coordination normal.     Comments:  5/5 strength throughout. CN 2-12 intact.Equal grip strength.   Psychiatric:        Behavior: Behavior normal.    ED Results / Procedures / Treatments   Labs (all labs ordered are listed, but only abnormal results are displayed) Labs Reviewed  CBC - Abnormal; Notable for the following components:      Result Value   RBC 3.96 (*)    Hemoglobin 10.5 (*)    HCT 35.5  (*)    MCHC 29.6 (*)    All other components within normal limits  COMPREHENSIVE METABOLIC PANEL - Abnormal; Notable for the following components:   CO2 21 (*)    Glucose,  Bld 176 (*)    BUN 59 (*)    Creatinine, Ser 6.89 (*)    Calcium 6.6 (*)    Total Protein 6.0 (*)    Albumin 2.2 (*)    Alkaline Phosphatase 150 (*)    GFR, Estimated 9 (*)    All other components within normal limits  TROPONIN I (HIGH SENSITIVITY) - Abnormal; Notable for the following components:   Troponin I (High Sensitivity) 92 (*)    All other components within normal limits  TROPONIN I (HIGH SENSITIVITY) - Abnormal; Notable for the following components:   Troponin I (High Sensitivity) 97 (*)    All other components within normal limits  LIPASE, BLOOD    EKG EKG Interpretation  Date/Time:  Wednesday April 11 2021 20:27:27 EST Ventricular Rate:  97 PR Interval:  136 QRS Duration: 138 QT Interval:  416 QTC Calculation: 528 R Axis:   26 Text Interpretation: Normal sinus rhythm Left ventricular hypertrophy with QRS widening and repolarization abnormality ( Cornell product , Romhilt-Estes ) Abnormal ECG No significant change was found Confirmed by Ezequiel Essex [phone]) on 04/11/2021 11:41:20 PM  Radiology DG Chest 1 View  Result Date: 04/11/2021 CLINICAL DATA:  Chest pain.  Missed dialysis. EXAM: CHEST  1 VIEW COMPARISON:  04/08/2021 FINDINGS: Left IJ central venous catheter is stable in positioning. Stable cardiomegaly. Similar mild diffuse interstitial prominence. No new airspace consolidation. No pleural effusion or pneumothorax. IMPRESSION: Cardiomegaly with persistent mild diffuse interstitial prominence, likely mild edema. Electronically Signed   By: Davina Poke D.O.   On: 04/11/2021 21:18    Procedures Procedures   Medications Ordered in ED Medications  alum & mag hydroxide-simeth (MAALOX/MYLANTA) 200-200-20 MG/5ML suspension 30 mL (has no administration in time range)    And   lidocaine (XYLOCAINE) 2 % viscous mouth solution 15 mL (has no administration in time range)  carvedilol (COREG) tablet 12.5 mg (has no administration in time range)  amLODipine (NORVASC) tablet 5 mg (has no administration in time range)  hydrALAZINE (APRESOLINE) tablet 25 mg (has no administration in time range)    ED Course  I have reviewed the triage vital signs and the nursing notes.  Pertinent labs & imaging results that were available during my care of the patient were reviewed by me and considered in my medical decision making (see chart for details).    MDM Rules/Calculators/A&P                          Chest pain and shortness of breath with history of ESRD and noncompliance.  EKG shows left bundle branch block similar to previous.  [name] is hypertensive and has not had his medications for 2 weeks.  Potassium is normal.  Troponin mildly elevated in the 90s. X-ray shows edema.  No hypoxia or increased work of breathing  Patient given his home blood pressure medications with improvement in his blood pressure.  His O2 saturation has dropped to the mid 80s at rest.  [name] is refusing to attempt to ambulate due to shortness of breath.  Plan to admit for dialysis in the morning.  No indication for emergent dialysis right now  Blood pressure has improved with home medications to 037 systolic.   Chest pain has improved.  Troponins remain flat with low suspicion for ACS, pulmonary embolism aortic dissection.  Blood pressure improving with home medications.  Patient hypoxic at rest and refusing to ambulate.  Given his hypoxia [name] will need admission  for dialysis later this morning.  Potassium is normal.  Admission discussed with Dr. Velia Meyer.  Message sent to nephrologist Dr. Royce Macadamia  Final Clinical Impression(s) / ED Diagnoses Final diagnoses:  Acute respiratory failure with hypercapnia ([place])  ESRD (end stage renal disease) ([place])  Chest pain, unspecified type    Rx / DC Orders ED  Discharge Orders     None        Wrigley Winborne, Annie Main, MD 04/12/21 [phone]

## 2021-04-11 NOTE — ED Triage Notes (Signed)
Pt comes via [facility] for CP while eating [employer] , missed dialysis for the past 2 weeks.

## 2021-04-11 NOTE — ED Provider Notes (Signed)
Emergency Medicine Provider Triage Evaluation Note  [name] , a 50 y.o. male  was evaluated in triage.  Pt complains of chest pain that started while eating [employer] pta. Also c/o abd pain whenever he eats. Has missed dialysis x2 weeks.  Review of Systems  Positive: Cp, abd pain, sob Negative: vomiting  Physical Exam  BP (!) 200/105   Pulse 99   Temp 98 F (36.7 C) (Oral)   Resp 18   SpO2 93%  Gen:   Awake, no distress   Resp:  Normal effort  MSK:   Moves extremities without difficulty  Other:  Coarse lung sounds throughout, heart with rrr, no murmurs, ble edema, diffuse abd ttp, no peritoneal signs  Medical Decision Making  Medically screening exam initiated at 8:20 PM.  Appropriate orders placed.  LEMOYNE Anderson was informed that the remainder of the evaluation will be completed by another provider, this initial triage assessment does not replace that evaluation, and the importance of remaining in the ED until their evaluation is complete.     [name] 04/11/21 2024    Douglas Lemming, MD 04/11/21 2136

## 2021-04-12 ENCOUNTER — Encounter (HOSPITAL_COMMUNITY): Payer: Self-pay | Admitting: Internal Medicine

## 2021-04-12 DIAGNOSIS — E8770 Fluid overload, unspecified: Secondary | ICD-10-CM | POA: Diagnosis present

## 2021-04-12 DIAGNOSIS — E119 Type 2 diabetes mellitus without complications: Secondary | ICD-10-CM

## 2021-04-12 DIAGNOSIS — E785 Hyperlipidemia, unspecified: Secondary | ICD-10-CM

## 2021-04-12 DIAGNOSIS — N186 End stage renal disease: Secondary | ICD-10-CM | POA: Diagnosis present

## 2021-04-12 DIAGNOSIS — Z794 Long term (current) use of insulin: Secondary | ICD-10-CM

## 2021-04-12 DIAGNOSIS — I10 Essential (primary) hypertension: Secondary | ICD-10-CM

## 2021-04-12 DIAGNOSIS — R0789 Other chest pain: Secondary | ICD-10-CM | POA: Diagnosis present

## 2021-04-12 LAB — COMPREHENSIVE METABOLIC PANEL
ALT: 26 U/L (ref 0–44)
AST: 29 U/L (ref 15–41)
Albumin: 1.9 g/dL — ABNORMAL LOW (ref 3.5–5.0)
Alkaline Phosphatase: 133 U/L — ABNORMAL HIGH (ref 38–126)
Anion gap: 7 (ref 5–15)
BUN: 60 mg/dL — ABNORMAL HIGH (ref 6–20)
CO2: 20 mmol/L — ABNORMAL LOW (ref 22–32)
Calcium: 6.4 mg/dL — CL (ref 8.9–10.3)
Chloride: 110 mmol/L (ref 98–111)
Creatinine, Ser: 6.79 mg/dL — ABNORMAL HIGH (ref 0.61–1.24)
GFR, Estimated: 9 mL/min — ABNORMAL LOW (ref >60)
Glucose, Bld: 122 mg/dL — ABNORMAL HIGH (ref 70–99)
Potassium: 4.3 mmol/L (ref 3.5–5.1)
Sodium: 137 mmol/L (ref 135–145)
Total Bilirubin: 0.3 mg/dL (ref 0.3–1.2)
Total Protein: 5.3 g/dL — ABNORMAL LOW (ref 6.5–8.1)

## 2021-04-12 LAB — PHOSPHORUS: Phosphorus: 5.8 mg/dL — ABNORMAL HIGH (ref 2.5–4.6)

## 2021-04-12 LAB — CBC
HCT: 30.3 % — ABNORMAL LOW (ref 39.0–52.0)
Hemoglobin: 9.2 g/dL — ABNORMAL LOW (ref 13.0–17.0)
MCH: 26.7 pg (ref 26.0–34.0)
MCHC: 30.4 g/dL (ref 30.0–36.0)
MCV: 88.1 fL (ref 80.0–100.0)
Platelets: 290 10*3/uL (ref 150–400)
RBC: 3.44 MIL/uL — ABNORMAL LOW (ref 4.22–5.81)
RDW: 14.6 % (ref 11.5–15.5)
WBC: 6.4 10*3/uL (ref 4.0–10.5)
nRBC: 0 % (ref 0.0–0.2)

## 2021-04-12 LAB — TROPONIN I (HIGH SENSITIVITY): Troponin I (High Sensitivity): 85 ng/L — ABNORMAL HIGH (ref <18)

## 2021-04-12 LAB — GLUCOSE, CAPILLARY
Glucose-Capillary: 116 mg/dL — ABNORMAL HIGH (ref 70–99)
Glucose-Capillary: 158 mg/dL — ABNORMAL HIGH (ref 70–99)
Glucose-Capillary: 123 mg/dL — ABNORMAL HIGH (ref 70–99)
Glucose-Capillary: 122 mg/dL — ABNORMAL HIGH (ref 70–99)

## 2021-04-12 LAB — RESP PANEL BY RT-PCR (FLU A&B, COVID) ARPGX2
Influenza A by PCR: NEGATIVE
Influenza B by PCR: NEGATIVE
SARS Coronavirus 2 by RT PCR: NEGATIVE

## 2021-04-12 LAB — MAGNESIUM: Magnesium: 1.7 mg/dL (ref 1.7–2.4)

## 2021-04-12 MED ORDER — GUAIFENESIN ER 600 MG PO TB12
600.0000 mg | ORAL_TABLET | Freq: Two times a day (BID) | ORAL | Status: DC
  Administered 2021-04-12 – 2021-04-15 (×5): 600 mg via ORAL
  Filled 2021-04-12 (×8): qty 1

## 2021-04-12 MED ORDER — HYDRALAZINE HCL 25 MG PO TABS
25.0000 mg | ORAL_TABLET | Freq: Three times a day (TID) | ORAL | Status: DC
  Administered 2021-04-12 – 2021-04-14 (×7): 25 mg via ORAL
  Filled 2021-04-12 (×7): qty 1

## 2021-04-12 MED ORDER — CALCIUM ACETATE (PHOS BINDER) 667 MG PO CAPS
667.0000 mg | ORAL_CAPSULE | Freq: Three times a day (TID) | ORAL | Status: DC
  Administered 2021-04-12 – 2021-04-16 (×8): 667 mg via ORAL
  Filled 2021-04-12 (×10): qty 1

## 2021-04-12 MED ORDER — INSULIN ASPART 100 UNIT/ML IJ SOLN
0.0000 [IU] | Freq: Three times a day (TID) | INTRAMUSCULAR | Status: DC
Start: 2021-04-12 — End: 2021-04-17

## 2021-04-12 MED ORDER — ATORVASTATIN CALCIUM 40 MG PO TABS
40.0000 mg | ORAL_TABLET | Freq: Every day | ORAL | Status: DC
  Administered 2021-04-12 – 2021-04-15 (×4): 40 mg via ORAL
  Filled 2021-04-12 (×5): qty 1

## 2021-04-12 MED ORDER — DARBEPOETIN ALFA 40 MCG/0.4ML IJ SOSY
40.0000 ug | PREFILLED_SYRINGE | INTRAMUSCULAR | Status: DC
  Administered 2021-04-13: 40 ug via INTRAVENOUS
  Filled 2021-04-12 (×2): qty 0.4

## 2021-04-12 MED ORDER — ACETAMINOPHEN 325 MG PO TABS
650.0000 mg | ORAL_TABLET | Freq: Four times a day (QID) | ORAL | Status: DC | PRN
  Administered 2021-04-13: 650 mg via ORAL
  Filled 2021-04-12: qty 2

## 2021-04-12 MED ORDER — CARVEDILOL 3.125 MG PO TABS
3.1250 mg | ORAL_TABLET | Freq: Two times a day (BID) | ORAL | Status: DC
  Administered 2021-04-12 – 2021-04-15 (×6): 3.125 mg via ORAL
  Filled 2021-04-12 (×9): qty 1

## 2021-04-12 MED ORDER — FUROSEMIDE 40 MG PO TABS
40.0000 mg | ORAL_TABLET | Freq: Once | ORAL | Status: AC
  Administered 2021-04-12: 40 mg via ORAL
  Filled 2021-04-12: qty 1

## 2021-04-12 MED ORDER — AMLODIPINE BESYLATE 10 MG PO TABS
10.0000 mg | ORAL_TABLET | Freq: Every day | ORAL | Status: DC
  Administered 2021-04-12 – 2021-04-15 (×4): 10 mg via ORAL
  Filled 2021-04-12 (×5): qty 1

## 2021-04-12 MED ORDER — CALCITRIOL 0.25 MCG PO CAPS
0.2500 ug | ORAL_CAPSULE | Freq: Every day | ORAL | Status: DC
  Administered 2021-04-12 – 2021-04-15 (×4): 0.25 ug via ORAL
  Filled 2021-04-12 (×6): qty 1

## 2021-04-12 MED ORDER — CHLORHEXIDINE GLUCONATE CLOTH 2 % EX PADS
6.0000 | MEDICATED_PAD | Freq: Every day | CUTANEOUS | Status: DC
  Administered 2021-04-12 – 2021-04-14 (×3): 6 via TOPICAL

## 2021-04-12 MED ORDER — INSULIN GLARGINE-YFGN 100 UNIT/ML ~~LOC~~ SOLN
8.0000 [IU] | Freq: Every day | SUBCUTANEOUS | Status: DC
  Administered 2021-04-13 – 2021-04-16 (×3): 8 [IU] via SUBCUTANEOUS
  Filled 2021-04-12 (×7): qty 0.08

## 2021-04-12 MED ORDER — ACETAMINOPHEN 650 MG RE SUPP: 650.0000 mg | Freq: Four times a day (QID) | RECTAL | Status: DC | PRN

## 2021-04-12 NOTE — H&P (Signed)
History and Physical    PLEASE NOTE THAT DRAGON DICTATION SOFTWARE WAS USED IN THE CONSTRUCTION OF THIS NOTE.   [name] BWL:[id number] DOB: [birth year] DOA: 04/11/2021  PCP: Vevelyn Francois, NP  Patient coming from: home   I have personally briefly reviewed patient's old medical records in [place]  Chief Complaint: sob  HPI: [name] is a 50 y.o. male with medical history significant for disease on hemodialysis on Tuesday, Thursday, Saturday schedule, type 2 diabetes mellitus, hypertension, hyperlipidemia, chronically elevated troponin, who is admitted to [facility] on 04/11/2021 with acute volume overload after presenting from home to [facility] ED complaining of shortness of breath.   Patient reports 3 to 4 days of progressive shortness of breath associated with mild orthopnea, worsening edema in the bilateral lower extremities.  He notes that this is in the context of missing each of his hemodialysis sessions over the course of the last 2 weeks, noting that he was unable to procure transportation to the dialysis center over that timeframe.  Reports intermittent nonexertional chest pain over that time, which she describes as sharp substernal nonradiating pain that is nonpositional and nonreproducible with palpation over the anterior aspect of the chest wall.  Has not taken any nitroglycerin.  He conveys that he is typically experiences similar chest discomfort when his blood pressure is running high, and acknowledges some suboptimal compliance with his home antihypertensive regimen.  Denies any associated subjective fever, chills, rigors, or generalized myalgias.  Not associate with any calf tenderness, new lower extremity erythema, hemoptysis.  No recent trauma.  No cough.  Per chart review, patient's troponin since July 2022 has been elevated in the range of 70-1 22.      ED Course:  Vital signs in the ED were notable for the following: Afebrile; heart rate  73-92; initial blood pressure noted to be 200/105, which improved to 139/73 following resumption of home antihypertensive medications, as further detailed below; respiratory rate 17-20, oxygen saturation 9500% on room air.  Labs were notable for the following: CMP notable for the following: Potassium 4.0, bicarbonate 21, anion gap 10, glucose 176.  High-sensitivity troponin initially noted to be 92, with repeat value trending up to 97.  COVID-19/influenza PCR checked in the ED today were found to be negative.  Imaging and additional notable ED work-up: EKG, in comparison to most recent prior from 04/08/2021 shows sinus rhythm, LVH, QRS widening, a rate 97, no evidence of interval T wave or ST changes.  Chest x-ray showed cardiomegaly with mild diffuse interstitial prominence suggestive of edema in the absence of overt infiltrate, and no evidence of pneumothorax.  EDP consulted nephrology for hemodialysis in the morning.  While in the ED, the following were administered: Resumption of the following home antihypertensive medications: Norvasc, Coreg, and hydralazine.     Review of Systems: As per HPI otherwise 10 point review of systems negative.   Past Medical History:  Diagnosis Date   Anxiety 02/2019   Anxiety 10/2019   Diabetes mellitus type II, uncontrolled 07/17/2006   Elevated alkaline phosphatase level 01/2019   ESSENTIAL HYPERTENSION 07/17/2006   HYPERLIPIDEMIA 11/24/2008   Insomnia 02/2019   Left bundle branch block 02/22/2019   Lesion of lip 10/2019   Pneumonia 10/22/2011   Suicidal ideations 02/2019   Vitamin D deficiency 01/2019    Past Surgical History:  Procedure Laterality Date   ARM SURGERY     AV FISTULA PLACEMENT Right 02/20/2021   Procedure: RIGHT ARTERIOVENOUS  GRAFT CREATION;  Surgeon: Angelia Mould, MD;  Location: [place];  Service: Vascular;  Laterality: Right;   INSERTION OF DIALYSIS CATHETER Left 02/20/2021   Procedure: INSERTION OF DIALYSIS CATHETER USING  PALINDROME CATHETER;  Surgeon: Angelia Mould, MD;  Location: [place];  Service: Vascular;  Laterality: Left;   ULTRASOUND GUIDANCE FOR VASCULAR ACCESS  02/20/2021   Procedure: ULTRASOUND GUIDANCE FOR VASCULAR ACCESS;  Surgeon: Angelia Mould, MD;  Location: [place];  Service: Vascular;;    Social History:  reports that he has never smoked. He has never used smokeless tobacco. He reports that he does not drink alcohol and does not use drugs.   Allergies  Allergen Reactions   Sulfa Antibiotics Rash    Severe rash. Do not give sulfa medications   Bactrim [Sulfamethoxazole-Trimethoprim]     Rash   Cephalexin     Patient with severe drug reaction including exfoliating skin rash and hypotension after being prescribed both cephalexin and sulfamethoxazole-trimethoprim simultaneously. Favor SMX as much more likely culprit as patient had tolerated other cephalosporins in the past, but cannot say with complete certainty that this was not related to cephalexin.    Other     Patient states he is allergic to the TB test    Family History  Problem Relation Age of Onset   Hypertension Mother 48   Cancer Mother        bladder cancer   Alcohol abuse Father    Hypertension Brother     Family history reviewed and not pertinent    Prior to Admission medications   Medication Sig Start Date End Date Taking? Authorizing Provider  amLODipine (NORVASC) 10 MG tablet Take 1 tablet by mouth once a day 03/22/21     atorvastatin (LIPITOR) 40 MG tablet Take 1 tablet (40 mg total) by mouth daily. 02/26/21   [name], [name], MD  Blood Glucose Monitoring Suppl (ACCU-CHEK GUIDE) w/Device KIT Use as directed 11/29/20   [name], MD  Blood Glucose Monitoring Suppl (TRUE METRIX METER) w/Device KIT Use to measure blood sugar twice a day 02/22/20   [name], MD  calcitRIOL (ROCALTROL) 0.25 MCG capsule Take 1 capsule (0.25 mcg total) by mouth daily. 02/26/21   [name], [name], MD   carvedilol (COREG) 12.5 MG tablet Take 1 tablet (12.85m total) by mouth two times a day with a meal. 03/22/21     Cholecalciferol (VITAMIN D HIGH POTENCY PO) Take by mouth. 03/08/21 03/05/22  [provider]  glucose blood test strip Use to check fasting blood sugar once daily. 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 Insulin dependent 11/29/20   KAnnita Brod MD  heparin 1000 unit/mL SOLN injection Heparin Sodium (Porcine) 1,000 Units/mL Catheter Lock Arterial 02/27/21 02/26/22  [provider]  hydrALAZINE (APRESOLINE) 25 MG tablet Take 1 tablet by mouth three times a day 03/22/21     insulin aspart (NOVOLOG FLEXPEN) 100 UNIT/ML FlexPen Inject 5 Units into the skin 2 (two) times daily with a meal. 02/26/21   [name], DSilver Huguenin MD  insulin glargine (LANTUS) 100 UNIT/ML Solostar Pen Inject 25 Units into the skin daily. May substitute with any other long-acting formulary. 02/26/21   [name], DSilver Huguenin MD  Insulin Pen Needle 32G X 4 MM MISC Use with insulin pens 02/26/21   [name], DSilver Huguenin MD  Methoxy PEG-Epoetin Beta (MIRCERA IJ) Mircera 03/08/21 03/07/22  [provider]  TRUEplus Lancets 28G MISC USE TO MEASURE BLOOD SUGAR TWICE A DAY 11/29/20 11/29/21  [name], MD  gabapentin (NEURONTIN) 300 MG capsule Take 1 capsule (300 mg total) by mouth 3 (three) times daily. Patient not taking: Reported on 07/27/2019 07/29/18 08/30/19  [name], FNP  insulin detemir (LEVEMIR FLEXTOUCH) 100 UNIT/ML FlexPen Inject 35 Units into the skin daily. Patient not taking: Reported on 02/17/2021 11/29/20 02/24/21  [name], MD  sildenafil (VIAGRA) 25 MG tablet Take 1 tablet (25 mg total) by mouth as needed for erectile dysfunction. for erectile dysfunction. Patient not taking: Reported on 08/11/2020 10/13/19 11/27/20  [name], FNP  sitaGLIPtin (JANUVIA) 25 MG tablet Take 1 tablet (25 mg total) by mouth daily. Patient not taking: Reported on 08/11/2020 02/22/20 11/27/20  [name], MD     Objective    Physical Exam: Vitals:   04/12/21 0105 04/12/21 0130 04/12/21 0145 04/12/21 0200  BP:  (!) 156/86 139/73 115/67  Pulse:  91 81 77  Resp:  17 17 11   Temp:      TempSrc:      SpO2:  90% 92% 97%  Weight: 63.5 kg     Height: 5' 7"  (1.702 m)       General: appears to be stated age; alert, oriented Skin: warm, dry, no rash Head:  AT/[place] Mouth:  Oral mucosa membranes appear moist, normal dentition Neck: supple; trachea midline Heart:  RRR; did not appreciate any M/R/G Lungs: CTAB, did not appreciate any wheezes, rales, or rhonchi Abdomen: + BS; soft, ND, NT Vascular: 2+ pedal pulses b/l; 2+ radial pulses b/l Extremities: Trace edema bilateral lower extremities, no muscle wasting Neuro: strength and sensation intact in upper and lower extremities b/l   Labs on Admission: I have personally reviewed following labs and imaging studies  CBC: Recent Labs  Lab 04/08/21 1526 04/11/21 2038  WBC 5.2 6.9  HGB 10.6* 10.5*  HCT 36.0* 35.5*  MCV 90.0 89.6  PLT 367 947   Basic Metabolic Panel: Recent Labs  Lab 04/08/21 1526 04/11/21 2038  NA 140 137  K 4.8 4.0  CL 109 106  CO2 24 21*  GLUCOSE 140* 176*  BUN 52* 59*  CREATININE 7.60* 6.89*  CALCIUM 6.9* 6.6*   GFR: Estimated Creatinine Clearance: 11.8 mL/min (A) (by C-G formula based on SCr of 6.89 mg/dL (H)). Liver Function Tests: Recent Labs  Lab 04/08/21 1526 04/11/21 2038  AST 27 37  ALT 23 29  ALKPHOS 148* 150*  BILITOT 0.4 0.5  PROT 6.4* 6.0*  ALBUMIN 2.2* 2.2*   Recent Labs  Lab 04/11/21 2038  LIPASE 29   No results for input(s): AMMONIA in the last 168 hours. Coagulation Profile: No results for input(s): INR, PROTIME in the last 168 hours. Cardiac Enzymes: No results for input(s): CKTOTAL, CKMB, CKMBINDEX, TROPONINI in the last 168 hours. BNP (last 3 results) No results for input(s): PROBNP in the last 8760 hours. HbA1C: No results for input(s): HGBA1C in the last  72 hours. CBG: No results for input(s): GLUCAP in the last 168 hours. Lipid Profile: No results for input(s): CHOL, HDL, LDLCALC, TRIG, CHOLHDL, LDLDIRECT in the last 72 hours. Thyroid Function Tests: No results for input(s): TSH, T4TOTAL, FREET4, T3FREE, THYROIDAB in the last 72 hours. Anemia Panel: No results for input(s): VITAMINB12, FOLATE, FERRITIN, TIBC, IRON, RETICCTPCT in the last 72 hours. Urine analysis:    Component Value Date/Time   COLORURINE YELLOW 02/19/2021 0045   APPEARANCEUR HAZY (A) 02/19/2021 0045   APPEARANCEUR Clear 05/09/2020 1509   LABSPEC 1.013 02/19/2021  0045   PHURINE 5.0 02/19/2021 0045   GLUCOSEU >=500 (A) 02/19/2021 0045   HGBUR MODERATE (A) 02/19/2021 0045   BILIRUBINUR NEGATIVE 02/19/2021 0045   BILIRUBINUR Negative 05/09/2020 1509   KETONESUR NEGATIVE 02/19/2021 0045   PROTEINUR >=300 (A) 02/19/2021 0045   UROBILINOGEN 0.2 02/04/2020 1612   UROBILINOGEN 0.2 02/16/2014 0045   NITRITE NEGATIVE 02/19/2021 0045   LEUKOCYTESUR NEGATIVE 02/19/2021 0045    Radiological Exams on Admission: DG Chest 1 View  Result Date: 04/11/2021 CLINICAL DATA:  Chest pain.  Missed dialysis. EXAM: CHEST  1 VIEW COMPARISON:  04/08/2021 FINDINGS: Left IJ central venous catheter is stable in positioning. Stable cardiomegaly. Similar mild diffuse interstitial prominence. No new airspace consolidation. No pleural effusion or pneumothorax. IMPRESSION: Cardiomegaly with persistent mild diffuse interstitial prominence, likely mild edema. Electronically Signed   By: Davina Poke D.O.   On: 04/11/2021 21:18     EKG: Independently reviewed, with result as described above.    Assessment/Plan   Principal Problem:   Volume overload Active Problems:   Hyperlipidemia LDL goal <70   Essential hypertension   End stage renal disease (HCC)   Atypical chest pain   DM2 (diabetes mellitus, type 2) (HCC)     #) Acute volume overload: 34 days of progressive shortness of breath  associated with orthopnea, worsening peripheral edema, and chest x-ray showing evidence of edema, all the context of the patient's knowledge been of suboptimal compliance hemodialysis in the setting of end-stage renal disease for which she is supposed to be on HD Tuesday, Thursday, Saturday schedule, with patient noting that he has not attended any hemodialysis session over the course of the last 2 weeks.  ACS as source for acute blood overload appears less likely relative to the above.  We will in the ED would appear to benefit from hemodialysis with standpoint of acute volume overload, there do not appear to be additional indications for urgent overnight hemodialysis at this time.  EDP is consulted on-call nephrology for hemodialysis in the morning.  No evidence of acute respiratory distress at this time.  Plan: Monitor strict I's and O's and daily weights.  Nephrology consulted for HD in the morning, as above.  Repeat CMP and add on serum magnesium level.  Check serum phosphorus level.  Repeat CBC in the morning.  Counseled the patient on the importance of improved compliance with attending scheduled HD sessions.  Continue home calcitriol.     #) Atypical chest pain: Intermittent nonexertional chest pain, does improve with improving blood pressure, EKG showed no evidence of acute ischemic changes relative to most recent prior, and with presenting troponin within range of chronic elevation over the last 4 months, yet with chest x-ray showing evidence of edema, but otherwise no evidence of acute cardiopulmonary process, including no evidence of pneumothorax.  Overall, presentation appears atypical for ACS.  Will repeat troponin in the morning, and correlate with ensuing hemodialysis, with diminished renal clearance and missing several hemodialysis sessions over the last few weeks likely contributing further to elevated presenting troponin level.  Confirms no current chest pain following improvement in blood  pressure in the ED, as above.  Plan: Monitor on telemetry.  Repeat troponin in the morning.  Hemodialysis in the morning.  Further evaluation management of hypertension, as detailed below.       #) Hypertension: Suspect secondary contributions of incisional disease as well as noncompliance with home regimen that consists of Norvasc, Coreg and hydralazine, as evidenced by significant provement in blood  pressure following resumption of these medications.  Plan: Counseled patient on importance of improved compliance with assuming hypertensive regimen.)  Blood pressure via routine vital signs.      #) Type 2 diabetes mellitus: On Lantus as well as scheduled NovoLog 5 units subcu twice daily with meals.  Present blood sugar 176.  Will resume portion of home basal insulin, score 5 below.  Plan: In terms of basal insulin, will start with Lantus 8 units subcu daily.  Hold scheduled home NovoLog.  Rather, Accu-Cheks before every meal and at bedtime with low-dose sliding scale insulin.       #) Hyperlipidemia: On high intensity atorvastatin as an outpatient.  Plan: Continue home statin.     DVT prophylaxis: SCD's   Code Status: Full code Family Communication: none Disposition Plan: Per Rounding Team Consults called: nephrology consulted by edp , as above. ;  Admission status: observation; pcu    PLEASE NOTE THAT DRAGON DICTATION SOFTWARE WAS USED IN THE CONSTRUCTION OF THIS NOTE.   Hico DO [facility]  From [place]   04/12/2021, 2:44 AM

## 2021-04-12 NOTE — ED Notes (Signed)
Patient refused to ambulate. RN notified

## 2021-04-12 NOTE — Consult Note (Addendum)
[facility] Renal Consultation Note    Indication for Consultation:  Management of ESRD/hemodialysis, anemia, hypertension/volume, and secondary hyperparathyroidism.  HPI: [name] is a 50 y.o. male with PMH including ESRD on dialysis, type 2 diabetes mellitus, hypertension, hyperlipidemia, who presented to the ED with shortness of breath after missing dialysis.  Patient reports he has not been to dialysis in about 2 weeks.  He reports 3 to 4 days of progressive shortness of breath and orthopnea, and reports his feet feel tight.  He reported chest pain was nonexertional and was sharp in nature.  Also reported he had not been compliant with his home antihypertensive medications.  On arrival, BP was elevated to 181/100 but has improved to 147/85.  His oxygen saturation has been 95 to 100% on room air.  Chest x-ray shows mild edema.  Labs notable for K4.3, BUN 60, creatinine 6.79, calcium 6.4, albumin 1.9, Phos 5.8.  Nephrology has been consulted for management of ESRD.  Patient is examined in his room this morning.  He is eating breakfast.  Appears comfortable on room air.  He denies any chest pain at present.  Reports mild shortness of breath and orthopnea.  Denies chest pain, palpitations, dizziness, abdominal pain, nausea, vomiting, diarrhea, involuntary muscle movements, fever, chills.  Of note, the social worker at his dialysis unit did provide him with an unlimited bus pass because he reported he was taking the bus to dialysis, but today he tells me bus loss often gets lost on the way to his house (?).  He is evasive during our conversation-  tells me that he wants to do PD but I told him that some trust needed to be built for [place] to think that he can do the dialysis on his own.  Also encouraged compliance in order to be considered a transplant candidate  Past Medical History:  Diagnosis Date   Anxiety 02/2019   Anxiety 10/2019   Diabetes mellitus type II, uncontrolled 07/17/2006    Elevated alkaline phosphatase level 01/2019   ESSENTIAL HYPERTENSION 07/17/2006   HYPERLIPIDEMIA 11/24/2008   Insomnia 02/2019   Left bundle branch block 02/22/2019   Lesion of lip 10/2019   Pneumonia 10/22/2011   Suicidal ideations 02/2019   Vitamin D deficiency 01/2019   Past Surgical History:  Procedure Laterality Date   ARM SURGERY     AV FISTULA PLACEMENT Right 02/20/2021   Procedure: RIGHT ARTERIOVENOUS GRAFT CREATION;  Surgeon: Angelia Mould, MD;  Location: [facility];  Service: Vascular;  Laterality: Right;   INSERTION OF DIALYSIS CATHETER Left 02/20/2021   Procedure: INSERTION OF DIALYSIS CATHETER USING PALINDROME CATHETER;  Surgeon: Angelia Mould, MD;  Location: [facility];  Service: Vascular;  Laterality: Left;   ULTRASOUND GUIDANCE FOR VASCULAR ACCESS  02/20/2021   Procedure: ULTRASOUND GUIDANCE FOR VASCULAR ACCESS;  Surgeon: Angelia Mould, MD;  Location: [facility] OR;  Service: Vascular;;   Family History  Problem Relation Age of Onset   Hypertension Mother 33   Cancer Mother        bladder cancer   Alcohol abuse Father    Hypertension Brother    Social History:  reports that he has never smoked. He has never used smokeless tobacco. He reports that he does not drink alcohol and does not use drugs.  ROS: As per HPI otherwise negative.  Physical Exam: Vitals:   04/12/21 0600 04/12/21 0637 04/12/21 0637 04/12/21 0733  BP: (!) 149/86  (!) 147/83 (!) 147/85  Pulse: 74  77 73  Resp: _0 Temp: 98.6 F (37 C)  98.7 F (37.1 C) 98.9 F (37.2 C)  TempSrc: Oral  Oral Oral  SpO2: 100%  94% 95%  Weight:  79.3 kg    Height:  _1  (1.702 m)       General: Well developed, well nourished, in no acute distress. Eating breakfast Head: Normocephalic, atraumatic, sclera non-icteric, mucus membranes are moist. Neck: Supple without lymphadenopathy/masses. JVD not elevated. Lungs: + crackles bilateral lower lobes. No wheezing. Respirations unlabored on room air,  able to speak in full sentences/eat without difficulty Heart: RRR with normal S1, S2. No murmurs, rubs, or gallops appreciated. Abdomen: Soft, non-tender, non-distended with normoactive bowel sounds. No rebound/guarding. No obvious abdominal masses. Musculoskeletal:  Strength and tone appear normal for age. Lower extremities: 1+ edema bilateral lower extremities Neuro: Alert and oriented X 3. Moves all extremities spontaneously. Psych:  Responds to questions appropriately with a normal affect. Dialysis Access: L IJ TDC with intact bandage, RUE AVF + bruit  Allergies  Allergen Reactions   Sulfa Antibiotics Rash    Severe rash. Do not give sulfa medications   Bactrim [Sulfamethoxazole-Trimethoprim]     Rash   Cephalexin     Patient with severe drug reaction including exfoliating skin rash and hypotension after being prescribed both cephalexin and sulfamethoxazole-trimethoprim simultaneously. Favor SMX as much more likely culprit as patient had tolerated other cephalosporins in the past, but cannot say with complete certainty that this was not related to cephalexin.    Other     Patient states he is allergic to the TB test   Prior to Admission medications   Medication Sig Start Date End Date Taking? Authorizing Provider  amLODipine (NORVASC) 10 MG tablet Take 1 tablet by mouth once a day 03/22/21  Yes   atorvastatin (LIPITOR) 40 MG tablet Take 1 tablet (40 mg total) by mouth daily. 02/26/21  Yes [name], [name], MD  Blood Glucose Monitoring Suppl (ACCU-CHEK GUIDE) w/Device KIT Use as directed 11/29/20  Yes [name], MD  Blood Glucose Monitoring Suppl (TRUE METRIX METER) w/Device KIT Use to measure blood sugar twice a day 02/22/20  Yes [name], MD  calcitRIOL (ROCALTROL) 0.25 MCG capsule Take 1 capsule (0.25 mcg total) by mouth daily. 02/26/21  Yes [name], [name], MD  carvedilol (COREG) 12.5 MG tablet Take 1 tablet (12.64m total) by mouth two times a day with a meal.  03/22/21  Yes   hydrALAZINE (APRESOLINE) 25 MG tablet Take 1 tablet by mouth three times a day 03/22/21  Yes   insulin aspart (NOVOLOG FLEXPEN) 100 UNIT/ML FlexPen Inject 5 Units into the skin 2 (two) times daily with a meal. 02/26/21  Yes [name], DSilver Huguenin MD  insulin glargine (LANTUS) 100 UNIT/ML Solostar Pen Inject 25 Units into the skin daily. May substitute with any other long-acting formulary. 02/26/21  Yes [name], DSilver Huguenin MD  Insulin Pen Needle 32G X 4 MM MISC Use with insulin pens 02/26/21  Yes [name], DSilver Huguenin MD  TRUEplus Lancets 28G MISC USE TO MEASURE BLOOD SUGAR TWICE A DAY 11/29/20 11/29/21 Yes KAnnita Brod MD  glucose blood test strip Use to check fasting blood sugar once daily. 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 Insulin dependent 11/29/20   KAnnita Brod MD  heparin 1000 unit/mL SOLN injection Heparin Sodium (Porcine) 1,000 Units/mL Catheter Lock Arterial 02/27/21 02/26/22  [provider]  Methoxy PEG-Epoetin Beta (MIRCERA IJ) Mircera 03/08/21 03/07/22  [provider]  gabapentin (NEURONTIN) 300 MG capsule Take 1 capsule (300 mg total) by mouth 3 (three) times daily. Patient not taking: Reported on 07/27/2019 07/29/18 08/30/19  [name], FNP  insulin detemir (LEVEMIR FLEXTOUCH) 100 UNIT/ML FlexPen Inject 35 Units into the skin daily. Patient not taking: Reported on 02/17/2021 11/29/20 02/24/21  [name], MD  sildenafil (VIAGRA) 25 MG tablet Take 1 tablet (25 mg total) by mouth as needed for erectile dysfunction. for erectile dysfunction. Patient not taking: Reported on 08/11/2020 10/13/19 11/27/20  [name], FNP  sitaGLIPtin (JANUVIA) 25 MG tablet Take 1 tablet (25 mg total) by mouth daily. Patient not taking: Reported on 08/11/2020 02/22/20 11/27/20  [name], MD   Current Facility-Administered Medications  Medication Dose Route Frequency Provider Last Rate Last Admin   acetaminophen (TYLENOL) tablet 650 mg  650 mg Oral Q6H PRN  Howerter, Justin B, DO       Or   acetaminophen (TYLENOL) suppository 650 mg  650 mg Rectal Q6H PRN Howerter, Justin B, DO       amLODipine (NORVASC) tablet 10 mg  10 mg Oral Daily Howerter, Justin B, DO       atorvastatin (LIPITOR) tablet 40 mg  40 mg Oral Daily Howerter, Justin B, DO       calcitRIOL (ROCALTROL) capsule 0.25 mcg  0.25 mcg Oral Daily Howerter, Justin B, DO       carvedilol (COREG) tablet 3.125 mg  3.125 mg Oral BID WC Howerter, Justin B, DO       hydrALAZINE (APRESOLINE) tablet 25 mg  25 mg Oral TID Howerter, Justin B, DO       insulin aspart (novoLOG) injection 0-6 Units  0-6 Units Subcutaneous TID WC Howerter, Justin B, DO       insulin glargine-yfgn (SEMGLEE) injection 8 Units  8 Units Subcutaneous Daily Rhetta Mura, DO       Labs: Basic Metabolic Panel: Recent Labs  Lab 04/08/21 1526 04/11/21 2038 04/12/21 0317  NA 140 137 137  K 4.8 4.0 4.3  CL 109 106 110  CO2 24 21* 20*  GLUCOSE 140* 176* 122*  BUN 52* 59* 60*  CREATININE 7.60* 6.89* 6.79*  CALCIUM 6.9* 6.6* 6.4*  PHOS  --   --  5.8*   Liver Function Tests: Recent Labs  Lab 04/08/21 1526 04/11/21 2038 04/12/21 0317  AST 27 37 29  ALT _0 ALKPHOS 148* 150* 133*  BILITOT 0.4 0.5 0.3  PROT 6.4* 6.0* 5.3*  ALBUMIN 2.2* 2.2* 1.9*   Recent Labs  Lab 04/11/21 2038  LIPASE 29   No results for input(s): AMMONIA in the last 168 hours. CBC: Recent Labs  Lab 04/08/21 1526 04/11/21 2038 04/12/21 0317  WBC 5.2 6.9 6.4  HGB 10.6* 10.5* 9.2*  HCT 36.0* 35.5* 30.3*  MCV 90.0 89.6 88.1  PLT 367 362 290   Cardiac Enzymes: No results for input(s): CKTOTAL, CKMB, CKMBINDEX, TROPONINI in the last 168 hours. CBG: Recent Labs  Lab 04/12/21 0653  GLUCAP 116*   Iron Studies: No results for input(s): IRON, TIBC, TRANSFERRIN, FERRITIN in the last 72 hours. Studies/Results: DG Chest 1 View  Result Date: 04/11/2021 CLINICAL DATA:  Chest pain.  Missed dialysis. EXAM: CHEST  1 VIEW  COMPARISON:  04/08/2021 FINDINGS: Left IJ central venous catheter is stable in positioning. Stable cardiomegaly. Similar mild diffuse interstitial prominence. No new airspace consolidation. No pleural effusion or pneumothorax. IMPRESSION: Cardiomegaly with persistent mild diffuse interstitial  prominence, likely mild edema. Electronically Signed   By: Davina Poke D.O.   On: 04/11/2021 21:18    Dialysis Orders: Center: [place]  on TTS . 180NRe, BFR 400, DFR 500, EDW 73kg, 2K, 2.5Ca, TDC, Heparin 2000 unit bolus-  has AVF placed 02/20/21 Mircera 30mg IV q 2 weeks- last dose 03/22/21 Calcitriol 0.25 mcg PO q HD   Assessment/Plan:  ESRD:  Normal schedule TTS, has not been to HD since 11/8. Fortunately, K+ is controlled, pt does not have any uremic symptoms, and he is oxygenating well on room air. HD unit is running emergent cases only today due to the holiday and patient does not appear to have emergent need for RRT at this time. Will plan for HD tomorrow morning. Pt is agreeable to this plan.  [facility] since [facility]-  discussion regarding compliance as noted above  Hypertension/volume: Mildly volume overloaded due to missed HD. Will plan for HD tomorrow AM with UFG 3.5L. Continue amlodipine, carvedilol and hydralazine. He takes lasix 444mon non-HD days and does produce urine, will give dose of lasix here.   Anemia: Hgb 9.2. Overdue for ESA due to missed HD, will give aranesp with dialysis tomorrow.   Metabolic bone disease: Corrected calcium is 8.1. Will resume calcitriol with HD. Phos 5.8, does not appear to be on a binder, will start calcium acetate 1 per meal.   Nutrition:  Albumin is low, will start PO protein supplement.  Non-compliance with HD: Pt reports issues with transportation - says the bus "gets lost" on the way to his house. He does have an unlimited bus pass. Would benefit from SW consult after the holiday.   SaAnice PaganiniPA-C 04/12/2021,  8:32 AM  [facility] Pager: [phone] seen and examined, agree with above note with above modifications. Has only been on HD for about 6 weeks and is not understanding how important it is-  I tried to reason with him-  he would benefit from some additional education-  I wonder if could be discharged to TCU so he can explore his interest of PD-  plan for HD tomorrow-  [facility]-  re initiate HD related meds  KeCorliss ParishMD 04/12/2021

## 2021-04-12 NOTE — Progress Notes (Signed)
PROGRESS NOTE  [name]  DOB: Oct 12, 1970  PCP: Vevelyn Francois, NP UUV:[id number]  DOA: 04/11/2021  LOS: 0 days  Hospital Day: 2  Chief Complaint  Patient presents with   Chest Pain    Brief narrative: [name] is a 50 y.o. male with PMH significant for ESRD HD TTS, DM2, HTN, HLD, chronically elevated troponin, history of suicidal ideation Patient was brought to the ED by EMS on 11/23 for chest pain while eating at [facility].  He apparently missed his dialysis for last 2 weeks. Patient reports 3 to 4 days of progressive shortness of breath worsening bilateral pedal edema.  Reports that he was unable to get into dialysis for last 2 weeks because of transportation issues. Admits to noncompliance with his meds at home.  In the ED, blood pressure was elevated to 200/105 which improved significantly after resumption of his oral meds.  O2 sat maintained on room air. Labs with potassium 4, bicarbonate 21 EKG in sinus rhythm, LVH, wide QRS, no evidence of interval T wave or ST changes Chest x-ray showed cardiomegaly with mild diffuse interstitial prominence suggestive of edema in the absence of infiltrate or pneumothorax Admitted to hospitalist service Nephrology consulted for resumption of hemodialysis  Subjective: Patient was seen and examined this morning.  Middle-aged African-American male.  Not in distress.  Walking from bathroom to the bed.  Not on supplemental oxygen. Hemodynamically stable with blood pressure in 140s overnight Labs this morning with potassium at 4.3, hemoglobin 9.2  Assessment/Plan: Volume overload status ESRD HD TTS Missed dialysis for 2 weeks -Presented with shortness of breath, volume overload status, missed dialysis for 2 weeks because of transportation issues -Nephrology consulted. -Breathing on room air at this time.  Atypical chest pain Chronically elevated troponin -Chest pain probably related to volume overload status -Troponin  chronically elevated.  EKG without significant interval changes Recent Labs    04/11/21 2038 04/11/21 2245 04/12/21 0746  TROPONINIHS 92* 97* 85*   Hypertensive urgency -Blood pressure elevated to over 200 at presentation.  Noncompliance to meds -Home meds have been resumed namely Norvasc, Coreg and hydralazine -Blood pressure improving.  -Counseled to improve compliance.   Type 2 diabetes mellitus -A1c 8.8 on 02/18/2021 -Home meds include Lantus and Premeal insulin.  Unclear compliance. -Currently on Lantus 18 units daily with sliding scale insulin. Recent Labs  Lab 04/12/21 0653  GLUCAP 116*   Hyperlipidemia -Lipitor  History of suicidal ideation -Per chart, he had suicidal ideation in the past.  Unable to confirm from the patient.  Denies any suicidal ideation at this time  Mobility: Independent Living condition: Lives at home Goals of care:   Code Status: Full Code  Nutritional status: Body mass index is 27.39 kg/m.     Diet:  Diet Order             Diet renal with fluid restriction Fluid restriction: 1200 mL Fluid; Room service appropriate? Yes; Fluid consistency: Thin  Diet effective now                  DVT prophylaxis:  SCDs Start: 04/12/21 0244   Antimicrobials: None Fluid: None Consultants: Nephrology Family Communication: None at bedside  Status is: Observation  Remains inpatient appropriate because: Pending resumption of dialysis  Dispo: The patient is from: Home              Anticipated d/c is to: Home after cleared by nephrology  Patient currently is not medically stable to d/c.   Difficult to place patient No     Infusions:    Scheduled Meds:  amLODipine  10 mg Oral Daily   atorvastatin  40 mg Oral Daily   calcitRIOL  0.25 mcg Oral Daily   calcium acetate  667 mg Oral TID WC   carvedilol  3.125 mg Oral BID WC   Chlorhexidine Gluconate Cloth  6 each Topical Q0600   [START ON 04/13/2021] darbepoetin (ARANESP)  injection - DIALYSIS  40 mcg Intravenous Q Fri-HD   hydrALAZINE  25 mg Oral TID   insulin aspart  0-6 Units Subcutaneous TID WC   insulin glargine-yfgn  8 Units Subcutaneous Daily    PRN meds: acetaminophen **OR** acetaminophen   Antimicrobials: Anti-infectives (From admission, onward)    None       Objective: Vitals:   04/12/21 0637 04/12/21 0733  BP: (!) 147/83 (!) 147/85  Pulse: 77 73  Resp: 18 17  Temp: 98.7 F (37.1 C) 98.9 F (37.2 C)  SpO2: 94% 95%   No intake or output data in the 24 hours ending 04/12/21 1031 Filed Weights   04/12/21 0105 04/12/21 0637  Weight: 63.5 kg 79.3 kg   Weight change:  Body mass index is 27.39 kg/m.   Physical Exam: General exam: Pleasant, middle-aged African-American male.  Not in physical distress Skin: No rashes, lesions or ulcers. HEENT: Atraumatic, normocephalic, no obvious bleeding Lungs: Clear to auscultation bilaterally CVS: Regular rate and rhythm, no murmur GI/Abd soft, nontender, nondistended, bowel sound present CNS: Alert, awake, oriented x3 Psychiatry: Mood appropriate Extremities: Trace bilateral pedal edema  Data Review: I have personally reviewed the laboratory data and studies available.  F/u labs ordered Unresulted Labs (From admission, onward)     Start     Ordered   04/13/21 0500  CBC with Differential/Platelet  Daily,   R      04/12/21 1031   04/13/21 3846  Basic metabolic panel  Daily,   R      04/12/21 1031   Signed and Held  Renal function panel  Once,   R        Signed and Held   Visual merchandiser and Held  CBC  Once,   R        Signed and Held   Signed and Held  Hepatitis B surface antigen  (New Admission Hemo Labs (Hepatitis B))  Once,   R        Signed and Held   Signed and Held  Hepatitis B surface antibody  (New Admission Hemo Labs (Hepatitis B))  Once,   R        Signed and Held   Signed and Held  Hepatitis B surface antibody,quantitative  (New Admission Hemo Labs (Hepatitis B))  Once,   R         Signed and Held            Signed, Terrilee Croak, MD [facility] 04/12/2021

## 2021-04-12 NOTE — ED Notes (Addendum)
PT refusing ambulation at this time. Will attempt again shortly.

## 2021-04-12 NOTE — Plan of Care (Signed)

## 2021-04-12 NOTE — ED Notes (Addendum)
PT placed on 2LPM [place]. Currently sating at 100%

## 2021-04-13 DIAGNOSIS — Z79899 Other long term (current) drug therapy: Secondary | ICD-10-CM | POA: Diagnosis not present

## 2021-04-13 DIAGNOSIS — Z20822 Contact with and (suspected) exposure to covid-19: Secondary | ICD-10-CM | POA: Diagnosis present

## 2021-04-13 DIAGNOSIS — F419 Anxiety disorder, unspecified: Secondary | ICD-10-CM | POA: Diagnosis present

## 2021-04-13 DIAGNOSIS — D649 Anemia, unspecified: Secondary | ICD-10-CM | POA: Diagnosis present

## 2021-04-13 DIAGNOSIS — E559 Vitamin D deficiency, unspecified: Secondary | ICD-10-CM | POA: Diagnosis present

## 2021-04-13 DIAGNOSIS — I447 Left bundle-branch block, unspecified: Secondary | ICD-10-CM | POA: Diagnosis present

## 2021-04-13 DIAGNOSIS — Z992 Dependence on renal dialysis: Secondary | ICD-10-CM | POA: Diagnosis not present

## 2021-04-13 DIAGNOSIS — F32A Depression, unspecified: Secondary | ICD-10-CM | POA: Diagnosis present

## 2021-04-13 DIAGNOSIS — I120 Hypertensive chronic kidney disease with stage 5 chronic kidney disease or end stage renal disease: Secondary | ICD-10-CM | POA: Diagnosis present

## 2021-04-13 DIAGNOSIS — Z9115 Patient's noncompliance with renal dialysis: Secondary | ICD-10-CM | POA: Diagnosis not present

## 2021-04-13 DIAGNOSIS — I517 Cardiomegaly: Secondary | ICD-10-CM | POA: Diagnosis present

## 2021-04-13 DIAGNOSIS — G4700 Insomnia, unspecified: Secondary | ICD-10-CM | POA: Diagnosis present

## 2021-04-13 DIAGNOSIS — I160 Hypertensive urgency: Secondary | ICD-10-CM | POA: Diagnosis present

## 2021-04-13 DIAGNOSIS — R600 Localized edema: Secondary | ICD-10-CM | POA: Diagnosis present

## 2021-04-13 DIAGNOSIS — E785 Hyperlipidemia, unspecified: Secondary | ICD-10-CM | POA: Diagnosis present

## 2021-04-13 DIAGNOSIS — N2581 Secondary hyperparathyroidism of renal origin: Secondary | ICD-10-CM | POA: Diagnosis present

## 2021-04-13 DIAGNOSIS — Z91199 Patient's noncompliance with other medical treatment and regimen due to unspecified reason: Secondary | ICD-10-CM | POA: Diagnosis not present

## 2021-04-13 DIAGNOSIS — E8770 Fluid overload, unspecified: Secondary | ICD-10-CM | POA: Diagnosis present

## 2021-04-13 DIAGNOSIS — Z8249 Family history of ischemic heart disease and other diseases of the circulatory system: Secondary | ICD-10-CM | POA: Diagnosis not present

## 2021-04-13 DIAGNOSIS — E1122 Type 2 diabetes mellitus with diabetic chronic kidney disease: Secondary | ICD-10-CM | POA: Diagnosis present

## 2021-04-13 DIAGNOSIS — I252 Old myocardial infarction: Secondary | ICD-10-CM | POA: Diagnosis not present

## 2021-04-13 DIAGNOSIS — J9602 Acute respiratory failure with hypercapnia: Secondary | ICD-10-CM | POA: Diagnosis present

## 2021-04-13 DIAGNOSIS — N186 End stage renal disease: Secondary | ICD-10-CM | POA: Diagnosis present

## 2021-04-13 DIAGNOSIS — M898X9 Other specified disorders of bone, unspecified site: Secondary | ICD-10-CM | POA: Diagnosis present

## 2021-04-13 LAB — CBC WITH DIFFERENTIAL/PLATELET
Abs Immature Granulocytes: 0.02 10*3/uL (ref 0.00–0.07)
Basophils Absolute: 0 10*3/uL (ref 0.0–0.1)
Basophils Relative: 0 %
Eosinophils Absolute: 0.2 10*3/uL (ref 0.0–0.5)
Eosinophils Relative: 3 %
HCT: 30.1 % — ABNORMAL LOW (ref 39.0–52.0)
Hemoglobin: 9.4 g/dL — ABNORMAL LOW (ref 13.0–17.0)
Immature Granulocytes: 0 %
Lymphocytes Relative: 11 %
Lymphs Abs: 0.7 10*3/uL (ref 0.7–4.0)
MCH: 27.3 pg (ref 26.0–34.0)
MCHC: 31.2 g/dL (ref 30.0–36.0)
MCV: 87.5 fL (ref 80.0–100.0)
Monocytes Absolute: 0.6 10*3/uL (ref 0.1–1.0)
Monocytes Relative: 9 %
Neutro Abs: 4.7 10*3/uL (ref 1.7–7.7)
Neutrophils Relative %: 77 %
Platelets: 302 10*3/uL (ref 150–400)
RBC: 3.44 MIL/uL — ABNORMAL LOW (ref 4.22–5.81)
RDW: 14.5 % (ref 11.5–15.5)
WBC: 6.1 10*3/uL (ref 4.0–10.5)
nRBC: 0 % (ref 0.0–0.2)

## 2021-04-13 LAB — BASIC METABOLIC PANEL
Anion gap: 10 (ref 5–15)
BUN: 61 mg/dL — ABNORMAL HIGH (ref 6–20)
CO2: 18 mmol/L — ABNORMAL LOW (ref 22–32)
Calcium: 6.5 mg/dL — ABNORMAL LOW (ref 8.9–10.3)
Chloride: 107 mmol/L (ref 98–111)
Creatinine, Ser: 7.55 mg/dL — ABNORMAL HIGH (ref 0.61–1.24)
GFR, Estimated: 8 mL/min — ABNORMAL LOW (ref >60)
Glucose, Bld: 131 mg/dL — ABNORMAL HIGH (ref 70–99)
Potassium: 4.5 mmol/L (ref 3.5–5.1)
Sodium: 135 mmol/L (ref 135–145)

## 2021-04-13 LAB — HEPATITIS B SURFACE ANTIBODY,QUALITATIVE: Hep B S Ab: NONREACTIVE

## 2021-04-13 LAB — GLUCOSE, CAPILLARY
Glucose-Capillary: 126 mg/dL — ABNORMAL HIGH (ref 70–99)
Glucose-Capillary: 127 mg/dL — ABNORMAL HIGH (ref 70–99)
Glucose-Capillary: 102 mg/dL — ABNORMAL HIGH (ref 70–99)
Glucose-Capillary: 142 mg/dL — ABNORMAL HIGH (ref 70–99)

## 2021-04-13 LAB — HEPATITIS B SURFACE ANTIGEN: Hepatitis B Surface Ag: NONREACTIVE

## 2021-04-13 MED ORDER — ONDANSETRON HCL 4 MG/2ML IJ SOLN: 4.0000 mg | Freq: Four times a day (QID) | INTRAMUSCULAR | Status: DC | PRN

## 2021-04-13 MED ORDER — FUROSEMIDE 40 MG PO TABS
40.0000 mg | ORAL_TABLET | Freq: Every day | ORAL | Status: DC
  Administered 2021-04-13 – 2021-04-15 (×3): 40 mg via ORAL
  Filled 2021-04-13 (×4): qty 1

## 2021-04-13 MED ORDER — HEPARIN SODIUM (PORCINE) 1000 UNIT/ML DIALYSIS
2000.0000 [IU] | Freq: Once | INTRAMUSCULAR | Status: AC
  Administered 2021-04-13: 2000 [IU] via INTRAVENOUS_CENTRAL
  Filled 2021-04-13: qty 2

## 2021-04-13 NOTE — Progress Notes (Signed)
Patient noted to have removed telemetry box. Patient instructed on importance of keeping tele box on. Telemetry reapplied to patient.

## 2021-04-13 NOTE — TOC Initial Note (Addendum)
Transition of Care [facility]) - Initial/Assessment Note    Patient Details  Name: [name] MRN: [id number] Date of Birth: June 05, 1970  Transition of Care [facility]) CM/SW Contact:    Sherrilyn Rist [facility] Supervisor Phone Number: [phone] 04/13/2021, 9:53 AM  Clinical Narrative:                 Talked to patient at the bedside. Patient lives at home with his mother and brother; PCP is Dr Edison Pace; goes to the [facility] and [facility] for medical care. He also stated that he attempted to apply for Medicaid but was told that he must wait for his Disability. Financial Counselor to see the patient / assist with Medicaid. He [facility] his [facility] from [facility] also. Talked to patient about transportation to Dialysis and possibly needing assistance. Patient verified that the Dialysis Center will give him bus passes and he still have the bus pass at home. No DME at this time. TOC will continue to follow for progression of care.  10:07 am- Financial Counselor screened patient 04/11/21- patient has applied for Medicaid, SSI/SSD. Mindi Slicker RN,MHA,BSN  Expected Discharge Plan: Home/Self Care Barriers to Discharge: No Barriers Identified   Patient Goals and CMS Choice Patient states their goals for this hospitalization and ongoing recovery are:: go home   Choice offered to / list presented to : NA  Expected Discharge Plan and Services Expected Discharge Plan: Home/Self Care In-house Referral: Financial Counselor Discharge Planning Services: CM Consult Post Acute Care Choice: NA Living arrangements for the past 2 months: Single Family Home                   DME Agency: NA       HH Arranged: NA          Prior Living Arrangements/Services Living arrangements for the past 2 months: Single Family Home Lives with:: Relatives Patient language and need for interpreter reviewed:: No Do you feel safe going back to the place where you live?: Yes      Need for Family  Participation in Patient Care: No (Comment) Care giver support system in place?: Yes (comment)   Criminal Activity/Legal Involvement Pertinent to Current Situation/Hospitalization: No - Comment as needed  Activities of Daily Living      Permission Sought/Granted Permission sought to share information with : Case Manager                Emotional Assessment Appearance:: Appears stated age Attitude/Demeanor/Rapport: Guarded Affect (typically observed): Calm Orientation: : Oriented to Self, Oriented to Place, Oriented to  Time, Oriented to Situation Alcohol / Substance Use: Not Applicable Psych Involvement: No (comment)  Admission diagnosis:  ESRD (end stage renal disease) ([place]) [N18.6] Volume overload [E87.70] Acute respiratory failure with hypercapnia (HCC) [J96.02] Chest pain [R07.9] Chest pain, unspecified type [R07.9] Patient Active Problem List   Diagnosis Date Noted   Volume overload 04/12/2021   Atypical chest pain 04/12/2021   Mild protein-calorie malnutrition ([place]) 03/06/2021   Aftercare including intermittent dialysis ([place]) 02/27/2021   Allergy, unspecified, initial encounter 02/27/2021   Anaphylactic shock, unspecified, initial encounter 02/27/2021   Anemia in chronic kidney disease 02/27/2021   Diarrhea, unspecified 02/27/2021   End stage renal disease ([place]) 02/27/2021   Other specified coagulation defects ([place]) 02/27/2021   Pruritus, unspecified 02/27/2021   Secondary hyperparathyroidism of renal origin ([place]) 02/27/2021   Shortness of breath 02/27/2021   Fever, unspecified 02/27/2021   Type 2 diabetes mellitus with other diabetic kidney  complication ([place]) 79/15/0569   Hypertensive urgency 02/18/2021   Type 2 diabetes mellitus ([place]) 02/17/2021   Acute combined systolic and diastolic congestive heart failure ([place]) 11/29/2020   Hypertensive crisis 11/27/2020   Lower extremity edema 03/07/2020   Stage 4 chronic kidney disease ([place]) 02/22/2020   Left  ventricular dysfunction 04/27/2019   Hemoglobin A1C greater than 9%, indicating poor diabetic control 07/29/2018   New onset left bundle branch block (LBBB)    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 [place] [address] Phone: [phone] Fax: [phone]  [place]), [place] - [address] [place] [address] Phone: [phone] Fax: [place] [address] [place] [address] Phone: [phone] Fax: [place] [address] Phone: [phone] Fax: [phone]     Social Determinants of Health (SDOH) Interventions    Readmission Risk Interventions Readmission Risk Prevention Plan 02/19/2021  Transportation Screening Complete  Medication Review ([place]) Complete  PCP or Specialist appointment within 3-5 days of discharge Complete  HRI or [place] Complete  SW Recovery Care/Counseling Consult Complete  [place] Not Applicable  Some recent data might be hidden

## 2021-04-13 NOTE — Progress Notes (Signed)
Patient continues to be noncompliant removing telemetry leads despite education and instructions.

## 2021-04-13 NOTE — Progress Notes (Signed)
Agreeable to keep teli box on

## 2021-04-13 NOTE — Procedures (Signed)
Patient was seen on dialysis and the procedure was supervised.  BFR 400  Via TDC BP is  140/71.   Patient appears to be tolerating treatment well  Louis Meckel 04/13/2021

## 2021-04-13 NOTE — Progress Notes (Signed)
Subjective:  sleeping in this AM- no more chest pain -  BP is good Objective Vital signs in last 24 hours: Vitals:   04/12/21 1601 04/12/21 2100 04/13/21 0500 04/13/21 0811  BP: 128/77 134/64 139/88 (!) 148/78  Pulse: 76 80 87 88  Resp: 18  18   Temp:  98.5 F (36.9 C)  99.9 F (37.7 C)  TempSrc: Oral Oral  Oral  SpO2: 94% 95% 96% (!) 85%  Weight:   79 kg   Height:       Weight change: 15.5 kg  Intake/Output Summary (Last 24 hours) at 04/13/2021 1042 Last data filed at 04/13/2021 0930 Gross per 24 hour  Intake 600 ml  Output --  Net 600 ml    Dialysis Orders: Center: [place]  on TTS . 180NRe, BFR 400, DFR 500, EDW 73kg, 2K, 2.5Ca, TDC, Heparin 2000 unit bolus-  has AVF placed 02/20/21 Mircera 51mcg IV q 2 weeks- last dose 03/22/21 Calcitriol 0.25 mcg PO q HD   Assessment/Plan:  ESRD:  Normal schedule TTS, has not been to HD since 11/8. Fortunately, K+ is controlled, pt does not have any uremic symptoms, and he is oxygenating well on room air. HD unit was running emergent cases only today due to the holiday  planning for HD later today.   Not sure that his [facility] will work since hasnt been accessed since 11/8-  discussion regarding compliance has been held-  he says he wants to do PD and im sure would want transplant-  needs to be more compliant in order for those things to come to fruition  Hypertension/volume: Mildly volume overloaded due to missed HD. Will plan for HD today with UFG 3.5L. Continue amlodipine, carvedilol and hydralazine on home low doses. He takes lasix 40mg  on non-HD days and does produce urine, will give lasix as well.  If he went to HD reg we likely could wean BP meds to a more simple reg  Anemia: Hgb 9.2. Overdue for ESA due to missed HD, will give aranesp with dialysis today   Metabolic bone disease: Corrected calcium is 8.1. Will resume calcitriol with HD. Phos 5.8, does not appear to be on a binder, will start calcium acetate 1 per meal.   Nutrition:  Albumin is  low, will start PO protein supplement.  Non-compliance with HD: Pt reports issues with transportation -  Would benefit from SW consult after the holiday. He told them that he does have an unlimited bus pass -  goes to [place]: Basic Metabolic Panel: Recent Labs  Lab 04/11/21 2038 04/12/21 0317 04/13/21 0312  NA 137 137 135  K 4.0 4.3 4.5  CL 106 110 107  CO2 21* 20* 18*  GLUCOSE 176* 122* 131*  BUN 59* 60* 61*  CREATININE 6.89* 6.79* 7.55*  CALCIUM 6.6* 6.4* 6.5*  PHOS  --  5.8*  --    Liver Function Tests: Recent Labs  Lab 04/08/21 1526 04/11/21 2038 04/12/21 0317  AST 27 37 29  ALT 23 29 26   ALKPHOS 148* 150* 133*  BILITOT 0.4 0.5 0.3  PROT 6.4* 6.0* 5.3*  ALBUMIN 2.2* 2.2* 1.9*   Recent Labs  Lab 04/11/21 2038  LIPASE 29   No results for input(s): AMMONIA in the last 168 hours. CBC: Recent Labs  Lab 04/08/21 1526 04/11/21 2038 04/12/21 0317 04/13/21 0312  WBC 5.2 6.9 6.4 6.1  NEUTROABS  --   --   --  4.7  HGB 10.6* 10.5* 9.2* 9.4*  HCT 36.0* 35.5* 30.3* 30.1*  MCV 90.0 89.6 88.1 87.5  PLT 367 362 290 302   Cardiac Enzymes: No results for input(s): CKTOTAL, CKMB, CKMBINDEX, TROPONINI in the last 168 hours. CBG: Recent Labs  Lab 04/12/21 0653 04/12/21 1200 04/12/21 1659 04/12/21 2143 04/13/21 0552  GLUCAP 116* 158* 123* 122* 126*    Iron Studies: No results for input(s): IRON, TIBC, TRANSFERRIN, FERRITIN in the last 72 hours. Studies/Results: DG Chest 1 View  Result Date: 04/11/2021 CLINICAL DATA:  Chest pain.  Missed dialysis. EXAM: CHEST  1 VIEW COMPARISON:  04/08/2021 FINDINGS: Left IJ central venous catheter is stable in positioning. Stable cardiomegaly. Similar mild diffuse interstitial prominence. No new airspace consolidation. No pleural effusion or pneumothorax. IMPRESSION: Cardiomegaly with persistent mild diffuse interstitial prominence, likely mild edema. Electronically Signed   By: Davina Poke  D.O.   On: 04/11/2021 21:18   Medications: Infusions:   Scheduled Medications:  amLODipine  10 mg Oral Daily   atorvastatin  40 mg Oral Daily   calcitRIOL  0.25 mcg Oral Daily   calcium acetate  667 mg Oral TID WC   carvedilol  3.125 mg Oral BID WC   Chlorhexidine Gluconate Cloth  6 each Topical Q0600   darbepoetin (ARANESP) injection - DIALYSIS  40 mcg Intravenous Q Fri-HD   guaiFENesin  600 mg Oral BID   heparin  2,000 Units Dialysis Once in dialysis   hydrALAZINE  25 mg Oral TID   insulin aspart  0-6 Units Subcutaneous TID WC   insulin glargine-yfgn  8 Units Subcutaneous Daily    have reviewed scheduled and prn medications.  Physical Exam: General:  resting-  did not open eyes-  denies complaint Heart: RRR Lungs: mostly clear Abdomen: soft,non tender Extremities: min edema Dialysis Access: had TDC and right upper arm AVF-  maturing     04/13/2021,10:42 AM  LOS: 0 days

## 2021-04-13 NOTE — Progress Notes (Signed)
PROGRESS NOTE  [name]  DOB: 12-12-1970  PCP: Vevelyn Francois, NP JJK:[id number]  DOA: 04/11/2021  LOS: 0 days  Hospital Day: 3  Chief Complaint  Patient presents with   Chest Pain    Brief narrative: [name] is a 50 y.o. male with PMH significant for ESRD HD TTS, DM2, HTN, HLD, chronically elevated troponin, history of suicidal ideation Patient was brought to the ED by EMS on 11/23 for chest pain while eating at [facility].  He apparently missed his dialysis for last 2 weeks. Patient reports 3 to 4 days of progressive shortness of breath worsening bilateral pedal edema.  Reports that he was unable to get into dialysis for last 2 weeks because of transportation issues. Admits to noncompliance with his meds at home.  In the ED, blood pressure was elevated to 200/105 which improved significantly after resumption of his oral meds.  O2 sat maintained on room air. Labs with potassium 4, bicarbonate 21 EKG in sinus rhythm, LVH, wide QRS, no evidence of interval T wave or ST changes Chest x-ray showed cardiomegaly with mild diffuse interstitial prominence suggestive of edema in the absence of infiltrate or pneumothorax Admitted to hospitalist service Nephrology consulted for resumption of hemodialysis  Subjective: Patient was seen and examined this morning.   Lying on bed.  Not in distress.  Complains of some nausea and 2 episodes of loose bowel movement this morning.   Tentatively plan for dialysis today.  Assessment/Plan: Volume overload status ESRD HD TTS Missed dialysis for 2 weeks -Presented with shortness of breath, volume overload status, missed dialysis for 2 weeks because of transportation issues -Nephrology following.  Tentative plan of dialysis today. -He has mild bilateral pedal edema and is breathing on room air at this time.  Atypical chest pain Chronically elevated troponin -Chest pain probably related to volume overload status -Troponin chronically  elevated.  EKG without significant interval changes Recent Labs    04/11/21 2038 04/11/21 2245 04/12/21 0746  TROPONINIHS 92* 97* 85*   Hypertensive urgency -Blood pressure elevated to over 200 at presentation.  Noncompliance to meds -Home meds have been resumed namely Norvasc, Coreg and hydralazine -Blood pressure improving.  -Counseled to improve compliance.   Type 2 diabetes mellitus -A1c 8.8 on 02/18/2021 -Home meds include Lantus and Premeal insulin.  Unclear compliance. -Currently on Lantus 18 units daily with sliding scale insulin. Recent Labs  Lab 04/12/21 1200 04/12/21 1659 04/12/21 2143 04/13/21 0552 04/13/21 1135  GLUCAP 158* 123* 122* 126* 127*   Hyperlipidemia -Lipitor  History of suicidal ideation -Per chart, he had suicidal ideation in the past.  Unable to confirm from the patient.  Denies any suicidal ideation at this time  Nausea/diarrhea -2 episodes of loose most of this morning.  Continue to monitor for now.  Start on Zofran IV as needed for nausea.  Mobility: Independent Living condition: Lives at home Goals of care:   Code Status: Full Code  Nutritional status: Body mass index is 27.28 kg/m.     Diet:  Diet Order             Diet renal with fluid restriction Fluid restriction: 1200 mL Fluid; Room service appropriate? Yes; Fluid consistency: Thin  Diet effective now                  DVT prophylaxis:  SCDs Start: 04/12/21 0244   Antimicrobials: None Fluid: None Consultants: Nephrology Family Communication: None at bedside  Status is: Observation  Remains inpatient  appropriate because: Pending resumption of dialysis  Dispo: The patient is from: Home              Anticipated d/c is to: Home after cleared by nephrology              Patient currently is not medically stable to d/c.   Difficult to place patient No     Infusions:    Scheduled Meds:  amLODipine  10 mg Oral Daily   atorvastatin  40 mg Oral Daily   calcitRIOL   0.25 mcg Oral Daily   calcium acetate  667 mg Oral TID WC   carvedilol  3.125 mg Oral BID WC   Chlorhexidine Gluconate Cloth  6 each Topical Q0600   darbepoetin (ARANESP) injection - DIALYSIS  40 mcg Intravenous Q Fri-HD   furosemide  40 mg Oral Daily   guaiFENesin  600 mg Oral BID   heparin  2,000 Units Dialysis Once in dialysis   hydrALAZINE  25 mg Oral TID   insulin aspart  0-6 Units Subcutaneous TID WC   insulin glargine-yfgn  8 Units Subcutaneous Daily    PRN meds: acetaminophen **OR** acetaminophen, ondansetron (ZOFRAN) IV   Antimicrobials: Anti-infectives (From admission, onward)    None       Objective: Vitals:   04/13/21 0811 04/13/21 1017  BP: (!) 148/78   Pulse: 88   Resp:    Temp: 99.9 F (37.7 C)   SpO2: (!) 85% 94%    Intake/Output Summary (Last 24 hours) at 04/13/2021 1142 Last data filed at 04/13/2021 0930 Gross per 24 hour  Intake 600 ml  Output --  Net 600 ml   Filed Weights   04/12/21 0105 04/12/21 0637 04/13/21 0500  Weight: 63.5 kg 79.3 kg 79 kg   Weight change: 15.5 kg Body mass index is 27.28 kg/m.   Physical Exam: General exam: Pleasant, middle-aged African-American male.  Not in physical distress Skin: No rashes, lesions or ulcers. HEENT: Atraumatic, normocephalic, no obvious bleeding Lungs: Clear to auscultation bilaterally CVS: Regular rate and rhythm, no murmur GI/Abd soft, nontender, nondistended, bowel sound present CNS: Alert, awake, oriented x3 Psychiatry: Mood appropriate Extremities: Trace to 1+ bilateral pedal edema  Data Review: I have personally reviewed the laboratory data and studies available.  F/u labs ordered Unresulted Labs (From admission, onward)     Start     Ordered   04/14/21 0000  Renal function panel  Once,   R        04/13/21 0618   04/14/21 0000  CBC  Once,   R        04/13/21 0618   04/13/21 0622  Hepatitis B surface antibody,quantitative  (New Admission Hemo Labs (Hepatitis B))  ONCE - STAT,    STAT        04/13/21 0621   04/13/21 0500  CBC with Differential/Platelet  Daily,   R      04/12/21 1031   04/13/21 3748  Basic metabolic panel  Daily,   R      04/12/21 1031            Signed, Terrilee Croak, MD [facility] 04/13/2021

## 2021-04-13 NOTE — Progress Notes (Signed)
Requesting something for diarrhea and nausea, page MD and requested med's. Awaiting on call back and new orders.

## 2021-04-14 LAB — CBC WITH DIFFERENTIAL/PLATELET
Abs Immature Granulocytes: 0.02 10*3/uL (ref 0.00–0.07)
Basophils Absolute: 0 10*3/uL (ref 0.0–0.1)
Basophils Relative: 0 %
Eosinophils Absolute: 0.1 10*3/uL (ref 0.0–0.5)
Eosinophils Relative: 1 %
HCT: 31.1 % — ABNORMAL LOW (ref 39.0–52.0)
Hemoglobin: 9.6 g/dL — ABNORMAL LOW (ref 13.0–17.0)
Immature Granulocytes: 0 %
Lymphocytes Relative: 16 %
Lymphs Abs: 0.9 10*3/uL (ref 0.7–4.0)
MCH: 26.7 pg (ref 26.0–34.0)
MCHC: 30.9 g/dL (ref 30.0–36.0)
MCV: 86.6 fL (ref 80.0–100.0)
Monocytes Absolute: 0.9 10*3/uL (ref 0.1–1.0)
Monocytes Relative: 15 %
Neutro Abs: 3.9 10*3/uL (ref 1.7–7.7)
Neutrophils Relative %: 68 %
Platelets: 259 10*3/uL (ref 150–400)
RBC: 3.59 MIL/uL — ABNORMAL LOW (ref 4.22–5.81)
RDW: 14.4 % (ref 11.5–15.5)
WBC: 5.8 10*3/uL (ref 4.0–10.5)
nRBC: 0 % (ref 0.0–0.2)

## 2021-04-14 LAB — GLUCOSE, CAPILLARY
Glucose-Capillary: 103 mg/dL — ABNORMAL HIGH (ref 70–99)
Glucose-Capillary: 107 mg/dL — ABNORMAL HIGH (ref 70–99)
Glucose-Capillary: 158 mg/dL — ABNORMAL HIGH (ref 70–99)
Glucose-Capillary: 90 mg/dL (ref 70–99)

## 2021-04-14 LAB — HEPATITIS B SURFACE ANTIBODY, QUANTITATIVE: Hep B S AB Quant (Post): <3.1 m[IU]/mL — ABNORMAL LOW (ref >9.9)

## 2021-04-14 MED ORDER — CHLORHEXIDINE GLUCONATE CLOTH 2 % EX PADS
6.0000 | MEDICATED_PAD | Freq: Every day | CUTANEOUS | Status: DC
  Administered 2021-04-15: 08:00:00 6 via TOPICAL

## 2021-04-14 MED ORDER — HYDRALAZINE HCL 25 MG PO TABS
25.0000 mg | ORAL_TABLET | Freq: Two times a day (BID) | ORAL | Status: DC
  Administered 2021-04-15: 08:00:00 25 mg via ORAL
  Filled 2021-04-14 (×4): qty 1

## 2021-04-14 MED ORDER — SERTRALINE HCL 50 MG PO TABS
50.0000 mg | ORAL_TABLET | Freq: Every day | ORAL | Status: DC
  Administered 2021-04-14 – 2021-04-15 (×2): 50 mg via ORAL
  Filled 2021-04-14 (×3): qty 1

## 2021-04-14 NOTE — Progress Notes (Signed)
Subjective:  HD yesterday-  I am just happy that the [facility] worked-  removed 3500 tolerated well -  tried to get him to open up more about why he is missing-  he admits that he might be depressed and would be interested in a antidepressant-  I wonder if we could try to re educate him and maybe send him to TCU since tells [place] that he may be interested in PD in the long term  Objective Vital signs in last 24 hours: Vitals:   04/13/21 1710 04/13/21 2042 04/14/21 0006 04/14/21 0414  BP: (!) 189/96 (!) 145/78 133/70 133/75  Pulse: 100 93 83 79  Resp: 20 (!) 22 20 18   Temp:  (!) 102.4 F (39.1 C) 99.3 F (37.4 C) 98.9 F (37.2 C)  TempSrc:  Oral Oral Oral  SpO2: 95% 91% 91% (!) 89%  Weight:      Height:       Weight change: 0.9 kg  Intake/Output Summary (Last 24 hours) at 04/14/2021 1041 Last data filed at 04/13/2021 1630 Gross per 24 hour  Intake 240 ml  Output 3500 ml  Net -3260 ml    Dialysis Orders: Center: [place]  on TTS . Did not go for over 2 weeks  180NRe, BFR 400, DFR 500, EDW 73kg, 2K, 2.5Ca, TDC, Heparin 2000 unit bolus-  has AVF placed 02/20/21 Mircera 57mcg IV q 2 weeks- last dose 03/22/21 Calcitriol 0.25 mcg PO q HD   Assessment/Plan:  ESRD:  Normal schedule TTS, has not been to HD since 11/8. Finally completed HD on 11/25.     discussion regarding compliance has been held-  he says he wants to do PD and im sure would want transplant-  needs to be more compliant in order for those things to come to fruition.  Will plan for HD again on Monday if still here.  Will also notify Kami so that he can get more education-  consider the TCU.  I also started him on zoloft  Hypertension/volume: Mildly volume overloaded due to missed HD.  HD 11/25 with UFG 3.5L. Continue amlodipine, carvedilol and hydralazine on home low doses. He takes lasix 40mg  on non-HD days and does produce urine, will give lasix as well.  If he went to HD reg we likely could wean BP meds to a more simple regimen-  BP good  this am-   will try to wean hydralazine   Anemia: Hgb 9.2. Overdue for ESA due to missed HD, given aranesp with dialysis yesterday  Metabolic bone disease: Corrected calcium is 8.1. Will resume calcitriol with HD. Phos 5.8, does not appear to be on a binder, started calcium acetate 1 per meal.   Nutrition:  Albumin is low, will start PO protein supplement.  Non-compliance with HD: Pt reports issues with transportation -  Would benefit from SW consult after the holiday. He told them that he does have an unlimited bus pass -  goes to [facility] so should be able to get there -  got the call that he would need to go to hospital if wants to return-  maybe could do [place]: Basic Metabolic Panel: Recent Labs  Lab 04/11/21 2038 04/12/21 0317 04/13/21 0312  NA 137 137 135  K 4.0 4.3 4.5  CL 106 110 107  CO2 21* 20* 18*  GLUCOSE 176* 122* 131*  BUN 59* 60* 61*  CREATININE 6.89* 6.79* 7.55*  CALCIUM 6.6* 6.4* 6.5*  PHOS  --  5.8*  --    Liver Function Tests: Recent Labs  Lab 04/08/21 1526 04/11/21 2038 04/12/21 0317  AST 27 37 29  ALT 23 29 26   ALKPHOS 148* 150* 133*  BILITOT 0.4 0.5 0.3  PROT 6.4* 6.0* 5.3*  ALBUMIN 2.2* 2.2* 1.9*   Recent Labs  Lab 04/11/21 2038  LIPASE 29   No results for input(s): AMMONIA in the last 168 hours. CBC: Recent Labs  Lab 04/08/21 1526 04/11/21 2038 04/12/21 0317 04/13/21 0312 04/14/21 0919  WBC 5.2 6.9 6.4 6.1 5.8  NEUTROABS  --   --   --  4.7 3.9  HGB 10.6* 10.5* 9.2* 9.4* 9.6*  HCT 36.0* 35.5* 30.3* 30.1* 31.1*  MCV 90.0 89.6 88.1 87.5 86.6  PLT 367 362 290 302 259   Cardiac Enzymes: No results for input(s): CKTOTAL, CKMB, CKMBINDEX, TROPONINI in the last 168 hours. CBG: Recent Labs  Lab 04/13/21 0552 04/13/21 1135 04/13/21 1709 04/13/21 2102 04/14/21 0612  GLUCAP 126* 127* 102* 142* 103*    Iron Studies: No results for input(s): IRON, TIBC, TRANSFERRIN, FERRITIN in the last 72  hours. Studies/Results: No results found. Medications: Infusions:   Scheduled Medications:  amLODipine  10 mg Oral Daily   atorvastatin  40 mg Oral Daily   calcitRIOL  0.25 mcg Oral Daily   calcium acetate  667 mg Oral TID WC   carvedilol  3.125 mg Oral BID WC   Chlorhexidine Gluconate Cloth  6 each Topical Q0600   darbepoetin (ARANESP) injection - DIALYSIS  40 mcg Intravenous Q Fri-HD   furosemide  40 mg Oral Daily   guaiFENesin  600 mg Oral BID   hydrALAZINE  25 mg Oral TID   insulin aspart  0-6 Units Subcutaneous TID WC   insulin glargine-yfgn  8 Units Subcutaneous Daily    have reviewed scheduled and prn medications.  Physical Exam: General:  more open today-  feels better after HD  Heart: RRR Lungs: mostly clear Abdomen: soft,non tender Extremities: min edema Dialysis Access: had TDC and right upper arm AVF-  maturing     04/14/2021,10:41 AM  LOS: 1 day

## 2021-04-14 NOTE — Progress Notes (Signed)
Patient refusing daily weight.

## 2021-04-14 NOTE — Progress Notes (Signed)
Pt refusing tele monitor to be placed back on.

## 2021-04-14 NOTE — Progress Notes (Signed)
Patient refusing to wear telemetry again. Patient also refusing lab work this AM.

## 2021-04-14 NOTE — Progress Notes (Signed)
PROGRESS NOTE  [name]  DOB: 08-21-1970  PCP: Vevelyn Francois, NP XNT:[id number]  DOA: 04/11/2021  LOS: 1 day  Hospital Day: 4  Chief Complaint  Patient presents with   Chest Pain    Brief narrative: [name] is a 50 y.o. male with PMH significant for ESRD HD TTS, DM2, HTN, HLD, chronically elevated troponin, history of suicidal ideation Patient was brought to the ED by EMS on 11/23 for chest pain while eating at [facility]  He apparently missed his dialysis for last 2 weeks. Patient reports 3 to 4 days of progressive shortness of breath worsening bilateral pedal edema.  Reports that he was unable to get into dialysis for last 2 weeks because of transportation issues. Admits to noncompliance with his meds at home.  In the ED, blood pressure was elevated to 200/105 which improved significantly after resumption of his oral meds.  O2 sat maintained on room air. Labs with potassium 4, bicarbonate 21 EKG in sinus rhythm, LVH, wide QRS, no evidence of interval T wave or ST changes Chest x-ray showed cardiomegaly with mild diffuse interstitial prominence suggestive of edema in the absence of infiltrate or pneumothorax Admitted to hospitalist service Nephrology consulted for resumption of hemodialysis  Subjective:  Patient seen and examined.  Patient known to me from previous hospitalizations.  Sleepy, does not want to wake up, does not want to make any eye contact and barely responding to any questions.  Denies any complaints.  Assessment/Plan: Volume overload status ESRD HD TTS Missed dialysis for 2 weeks -Presented with shortness of breath, volume overload status, missed dialysis for 2 weeks because of transportation issues but he has not issues with the noncompliance chronically. -Nephrology following.  Received dialysis yesterday.  Next dialysis Monday.    Atypical chest pain Chronically elevated troponin -Chest pain probably related to volume overload  status -Troponin chronically elevated.  EKG without significant interval changes Recent Labs    04/11/21 2038 04/11/21 2245 04/12/21 0746  TROPONINIHS 92* 97* 85*    Hypertensive urgency -Blood pressure elevated to over 200 at presentation.  Noncompliance to meds -Home meds have been resumed namely Norvasc, Coreg and hydralazine.  Blood pressure controlled.  Compliance encouraged.  Type 2 diabetes mellitus -A1c 8.8 on 02/18/2021 -Home meds include Lantus and Premeal insulin.  Unclear compliance. -Currently on Lantus 18 units daily with sliding scale insulin.  Blood sugar controlled. Recent Labs  Lab 04/13/21 1135 04/13/21 1709 04/13/21 2102 04/14/21 0612 04/14/21 1132  GLUCAP 127* 102* 142* 103* 107*    Hyperlipidemia -Lipitor  History of suicidal ideation/depression -Per chart, he had suicidal ideation in the past.  Does not have any comments like that.  Per nephrology note, he did mention that he was depressed.  He has been started on Zoloft by nephrology.  Nausea/diarrhea Resolved.  High-grade fever: Per chart, he had fever of 102.4 at around 9 PM on 04/13/2021.  Unsure whether any provider was notified by nursing staff.  No blood cultures drawn.  Patient has no specific complaint to direct to any organ system for infection.  Due to HD catheter, remains at high risk for line infection and bacteremia.  I have ordered blood cultures.  We will monitor closely.  If has recurrent fever, will consider starting on antibiotics empirically.  Mobility: Independent Living condition: Lives at home Goals of care:   Code Status: Full Code  Nutritional status: Body mass index is 25.83 kg/m.     Diet:  Diet Order  Diet renal with fluid restriction Fluid restriction: 1200 mL Fluid; Room service appropriate? Yes; Fluid consistency: Thin  Diet effective now                  DVT prophylaxis:  SCDs Start: 04/12/21 0244   Antimicrobials: None Fluid:  None Consultants: Nephrology Family Communication: None at bedside  Status is: Inpatient  Remains inpatient appropriate because: High-grade fever investigation and management.  Infusions:    Scheduled Meds:  amLODipine  10 mg Oral Daily   atorvastatin  40 mg Oral Daily   calcitRIOL  0.25 mcg Oral Daily   calcium acetate  667 mg Oral TID WC   carvedilol  3.125 mg Oral BID WC   Chlorhexidine Gluconate Cloth  6 each Topical Q0600   darbepoetin (ARANESP) injection - DIALYSIS  40 mcg Intravenous Q Fri-HD   furosemide  40 mg Oral Daily   guaiFENesin  600 mg Oral BID   hydrALAZINE  25 mg Oral BID   insulin aspart  0-6 Units Subcutaneous TID WC   insulin glargine-yfgn  8 Units Subcutaneous Daily   sertraline  50 mg Oral Daily    PRN meds: acetaminophen **OR** acetaminophen, ondansetron (ZOFRAN) IV   Antimicrobials: Anti-infectives (From admission, onward)    None       Objective: Vitals:   04/14/21 1135 04/14/21 1200  BP: 115/63   Pulse: 74   Resp:    Temp: 99.1 F (37.3 C) 98.9 F (37.2 C)  SpO2: (!) 80% 93%    Intake/Output Summary (Last 24 hours) at 04/14/2021 1401 Last data filed at 04/14/2021 1259 Gross per 24 hour  Intake 240 ml  Output 3500 ml  Net -3260 ml    Filed Weights   04/13/21 0500 04/13/21 1311 04/13/21 1630  Weight: 79 kg 79.9 kg 74.8 kg   Weight change: 0.9 kg Body mass index is 25.83 kg/m.   Physical Exam:  General exam: Appears calm and comfortable  Respiratory system: Clear to auscultation. Respiratory effort normal. Cardiovascular system: S1 & S2 heard, RRR. No JVD, murmurs, rubs, gallops or clicks. No pedal edema. Gastrointestinal system: Abdomen is nondistended, soft and nontender. No organomegaly or masses felt. Normal bowel sounds heard. Central nervous system: Alert and oriented. No focal neurological deficits. Extremities: Symmetric 5 x 5 power. Skin: No rashes, lesions or ulcers.  Psychiatry: Judgement and insight appear  poor   Data Review: I have personally reviewed the laboratory data and studies available.  F/u labs ordered Unresulted Labs (From admission, onward)     Start     Ordered   04/14/21 0811  Culture, blood (routine x 2)  BLOOD CULTURE X 2,   R (with TIMED occurrences)      04/14/21 0810   04/13/21 0500  CBC with Differential/Platelet  Daily,   R      04/12/21 1031   04/13/21 0354  Basic metabolic panel  Daily,   R      04/12/21 1031            Signed, [name], MD [facility] 04/14/2021

## 2021-04-15 LAB — GLUCOSE, CAPILLARY
Glucose-Capillary: 86 mg/dL (ref 70–99)
Glucose-Capillary: 97 mg/dL (ref 70–99)
Glucose-Capillary: 119 mg/dL — ABNORMAL HIGH (ref 70–99)
Glucose-Capillary: 161 mg/dL — ABNORMAL HIGH (ref 70–99)

## 2021-04-15 NOTE — Progress Notes (Signed)
Patient refused PM meds.

## 2021-04-15 NOTE — Progress Notes (Signed)
Subjective:  refusing some meds and labs-  sleeping this AM-  did respond to my questions   Objective Vital signs in last 24 hours: Vitals:   04/14/21 1200 04/14/21 1928 04/15/21 0618 04/15/21 0801  BP:  116/60 (!) 154/81 139/75  Pulse:  75 75 78  Resp:  18  17  Temp: 98.9 F (37.2 C) 98 F (36.7 C) 98.7 F (37.1 C) 98.6 F (37 C)  TempSrc:  Oral Oral Oral  SpO2: 93% 92% 96% (!) 79%  Weight:      Height:       Weight change:   Intake/Output Summary (Last 24 hours) at 04/15/2021 1884 Last data filed at 04/14/2021 1259 Gross per 24 hour  Intake 240 ml  Output --  Net 240 ml    Dialysis Orders: Center: [place]  on TTS . Did not go for over 2 weeks  180NRe, BFR 400, DFR 500, EDW 73kg, 2K, 2.5Ca, TDC, Heparin 2000 unit bolus-  has AVF placed 02/20/21 Mircera 25mcg IV q 2 weeks- last dose 03/22/21 Calcitriol 0.25 mcg PO q HD   Assessment/Plan:  ESRD:  Normal schedule TTS, has not been to HD since 11/8. completed HD on 11/25.     discussion regarding compliance has been held-  he says he wants to do PD and im sure would want transplant-  needs to be more compliant in order for those things to come to fruition.  Will plan for HD again on Monday.  Will also notify Kami and Linus Orn so that he can get more education-  consider the TCU.  I also started him on zoloft as he admitted that this has been a lot for him   Hypertension/volume: Mildly volume overloaded due to missed HD.  HD 11/25 with UFG 3.5L. Continue amlodipine, carvedilol and hydralazine on home low doses. He takes lasix 40mg  on non-HD days and does produce urine, will give lasix as well.  If he went to HD reg we likely could wean BP meds to a more simple regimen-  BP good this am-   will try to wean hydralazine -  decreased it to BID  Anemia: Hgb 9.2. Overdue for ESA due to missed HD, given aranesp with dialysis 16/60  Metabolic bone disease: Corrected calcium is 8.1. resumed calcitriol with HD. Phos 5.8, does not appear to be on a  binder, started calcium acetate 1 per meal.   Nutrition:  Albumin is low, will start PO protein supplement.  Non-compliance with HD: Pt reports issues with transportation -  Would benefit from SW consult after the holiday. He told them that he does have an unlimited bus pass -  goes to [facility] so should be able to get there -  got the call from the OP unit that he would need to go to hospital if wants to return-  maybe could do TCU ?  Will get our education team involved tomorrow so that he can be more successful with OP HD    [place]: Basic Metabolic Panel: Recent Labs  Lab 04/11/21 2038 04/12/21 0317 04/13/21 0312  NA 137 137 135  K 4.0 4.3 4.5  CL 106 110 107  CO2 21* 20* 18*  GLUCOSE 176* 122* 131*  BUN 59* 60* 61*  CREATININE 6.89* 6.79* 7.55*  CALCIUM 6.6* 6.4* 6.5*  PHOS  --  5.8*  --    Liver Function Tests: Recent Labs  Lab 04/08/21 1526 04/11/21 2038 04/12/21 0317  AST  27 37 29  ALT 23 29 26   ALKPHOS 148* 150* 133*  BILITOT 0.4 0.5 0.3  PROT 6.4* 6.0* 5.3*  ALBUMIN 2.2* 2.2* 1.9*   Recent Labs  Lab 04/11/21 2038  LIPASE 29   No results for input(s): AMMONIA in the last 168 hours. CBC: Recent Labs  Lab 04/08/21 1526 04/11/21 2038 04/12/21 0317 04/13/21 0312 04/14/21 0919  WBC 5.2 6.9 6.4 6.1 5.8  NEUTROABS  --   --   --  4.7 3.9  HGB 10.6* 10.5* 9.2* 9.4* 9.6*  HCT 36.0* 35.5* 30.3* 30.1* 31.1*  MCV 90.0 89.6 88.1 87.5 86.6  PLT 367 362 290 302 259   Cardiac Enzymes: No results for input(s): CKTOTAL, CKMB, CKMBINDEX, TROPONINI in the last 168 hours. CBG: Recent Labs  Lab 04/14/21 0612 04/14/21 1132 04/14/21 1602 04/14/21 2113 04/15/21 0617  GLUCAP 103* 107* 158* 90 86    Iron Studies: No results for input(s): IRON, TIBC, TRANSFERRIN, FERRITIN in the last 72 hours. Studies/Results: No results found. Medications: Infusions:   Scheduled Medications:  amLODipine  10 mg Oral Daily   atorvastatin  40 mg Oral Daily    calcitRIOL  0.25 mcg Oral Daily   calcium acetate  667 mg Oral TID WC   carvedilol  3.125 mg Oral BID WC   Chlorhexidine Gluconate Cloth  6 each Topical Q0600   darbepoetin (ARANESP) injection - DIALYSIS  40 mcg Intravenous Q Fri-HD   furosemide  40 mg Oral Daily   guaiFENesin  600 mg Oral BID   hydrALAZINE  25 mg Oral BID   insulin aspart  0-6 Units Subcutaneous TID WC   insulin glargine-yfgn  8 Units Subcutaneous Daily   sertraline  50 mg Oral Daily    have reviewed scheduled and prn medications.  Physical Exam: General:  not very interactive today again  Heart: RRR Lungs: mostly clear Abdomen: soft,non tender Extremities: min edema Dialysis Access: had TDC and right upper arm AVF-  maturing     04/15/2021,9:22 AM  LOS: 2 days

## 2021-04-15 NOTE — Progress Notes (Signed)
Patient refused AM labs and HS meds. Will try to redraw labs at 7am.

## 2021-04-15 NOTE — Progress Notes (Addendum)
PROGRESS NOTE  [name]  DOB: August 31, 1970  PCP: Vevelyn Francois, NP WFU:[id number]  DOA: 04/11/2021  LOS: 2 days  Hospital Day: 5  Chief Complaint  Patient presents with   Chest Pain    Brief narrative: [name] is a 50 y.o. male with PMH significant for ESRD HD TTS, DM2, HTN, HLD, chronically elevated troponin, history of suicidal ideation Patient was brought to the ED by EMS on 11/23 for chest pain while eating at [facility].  He apparently missed his dialysis for last 2 weeks. Patient reports 3 to 4 days of progressive shortness of breath worsening bilateral pedal edema.  Reports that he was unable to get into dialysis for last 2 weeks because of transportation issues. Admits to noncompliance with his meds at home.  In the ED, blood pressure was elevated to 200/105 which improved significantly after resumption of his oral meds.  O2 sat maintained on room air. Labs with potassium 4, bicarbonate 21 EKG in sinus rhythm, LVH, wide QRS, no evidence of interval T wave or ST changes Chest x-ray showed cardiomegaly with mild diffuse interstitial prominence suggestive of edema in the absence of infiltrate or pneumothorax Admitted to hospitalist service Nephrology consulted for resumption of hemodialysis  Subjective:  Seen and examined.  Sleeping and not interested in having any conversation as usual, ignoring and not making any eye contact.  Looks comfortable and denied any complaints.  Refused labs this morning.  Which is also not unusual for him. Assessment/Plan: Volume overload status ESRD HD TTS Missed dialysis for 2 weeks -Presented with shortness of breath, volume overload status, missed dialysis for 2 weeks because of transportation issues but he has not issues with the noncompliance chronically. -Nephrology following.  Received dialysis yesterday.  Next dialysis Monday.    Atypical chest pain Chronically elevated troponin -Chest pain probably related to volume  overload status -Troponin chronically elevated.  EKG without significant interval changes No results for input(s): CKTOTAL, CKMB, TROPONINIHS, RELINDX in the last 72 hours.  Hypertensive urgency -Blood pressure elevated to over 200 at presentation.  Noncompliance to meds -Home meds have been resumed namely Norvasc, Coreg and hydralazine.  Blood pressure controlled.  Compliance encouraged.  Type 2 diabetes mellitus -A1c 8.8 on 02/18/2021 -Home meds include Lantus and Premeal insulin.  Unclear compliance. -Currently on Lantus 18 units daily with sliding scale insulin.  Blood sugar controlled. Recent Labs  Lab 04/14/21 1132 04/14/21 1602 04/14/21 2113 04/15/21 0617 04/15/21 1220  GLUCAP 107* 158* 90 86 97    Hyperlipidemia -Lipitor  History of suicidal ideation/depression -Per chart, he had suicidal ideation in the past.  Does not have any comments like that.  Per nephrology note, he did mention that he was depressed.  He has been started on Zoloft by nephrology.  Nausea/diarrhea Resolved.  High-grade fever: Per chart, he had fever of 102.4 at around 9 PM on 04/13/2021.  Blood culture drawn on 04/14/2021 are negative thus far.  He is afebrile since then.  We will keep overnight for observation and monitoring of blood cultures for 48 hours which will be tomorrow, if negative, we plan to discharge him after dialysis and education around dialysis tomorrow .  Mobility: Independent Living condition: Lives at home Goals of care:   Code Status: Full Code  Nutritional status: Body mass index is 25.83 kg/m.     Diet:  Diet Order             Diet renal with fluid restriction Fluid restriction:  1200 mL Fluid; Room service appropriate? Yes; Fluid consistency: Thin  Diet effective now                  DVT prophylaxis:  SCDs Start: 04/12/21 0244   Antimicrobials: None Fluid: None Consultants: Nephrology Family Communication: None at bedside  Status is: Inpatient  Remains  inpatient appropriate because: High-grade fever investigation and management.  Infusions:    Scheduled Meds:  amLODipine  10 mg Oral Daily   atorvastatin  40 mg Oral Daily   calcitRIOL  0.25 mcg Oral Daily   calcium acetate  667 mg Oral TID WC   carvedilol  3.125 mg Oral BID WC   Chlorhexidine Gluconate Cloth  6 each Topical Q0600   darbepoetin (ARANESP) injection - DIALYSIS  40 mcg Intravenous Q Fri-HD   furosemide  40 mg Oral Daily   guaiFENesin  600 mg Oral BID   hydrALAZINE  25 mg Oral BID   insulin aspart  0-6 Units Subcutaneous TID WC   insulin glargine-yfgn  8 Units Subcutaneous Daily   sertraline  50 mg Oral Daily    PRN meds: acetaminophen **OR** acetaminophen, ondansetron (ZOFRAN) IV   Antimicrobials: Anti-infectives (From admission, onward)    None       Objective: Vitals:   04/15/21 0801 04/15/21 0815  BP: 139/75   Pulse: 78   Resp: 17   Temp: 98.6 F (37 C)   SpO2: (!) 79% 95%    Intake/Output Summary (Last 24 hours) at 04/15/2021 1228 Last data filed at 04/14/2021 1259 Gross per 24 hour  Intake 240 ml  Output --  Net 240 ml    Filed Weights   04/13/21 0500 04/13/21 1311 04/13/21 1630  Weight: 79 kg 79.9 kg 74.8 kg   Weight change:  Body mass index is 25.83 kg/m.   Physical Exam:  General exam: Appears calm and comfortable  Respiratory system: Clear to auscultation. Respiratory effort normal. Cardiovascular system: S1 & S2 heard, RRR. No JVD, murmurs, rubs, gallops or clicks. No pedal edema. Gastrointestinal system: Abdomen is nondistended, soft and nontender. No organomegaly or masses felt. Normal bowel sounds heard. Central nervous system: Alert and oriented. No focal neurological deficits. Extremities: Symmetric 5 x 5 power. Skin: No rashes, lesions or ulcers.  Psychiatry: Judgement and insight appear poor   Data Review: I have personally reviewed the laboratory data and studies available.  F/u labs ordered Unresulted Labs  (From admission, onward)     Start     Ordered   04/13/21 0500  CBC with Differential/Platelet  Daily,   R      04/12/21 1031   Signed and Held  Renal function panel  Once,   R        Signed and Held   Signed and Held  CBC  Once,   R        Signed and Held            Signed, [name], MD [facility] 04/15/2021

## 2021-04-16 ENCOUNTER — Other Ambulatory Visit (HOSPITAL_COMMUNITY): Payer: Self-pay

## 2021-04-16 LAB — RENAL FUNCTION PANEL
Albumin: 1.9 g/dL — ABNORMAL LOW (ref 3.5–5.0)
Anion gap: 9 (ref 5–15)
BUN: 51 mg/dL — ABNORMAL HIGH (ref 6–20)
CO2: 21 mmol/L — ABNORMAL LOW (ref 22–32)
Calcium: 7.3 mg/dL — ABNORMAL LOW (ref 8.9–10.3)
Chloride: 102 mmol/L (ref 98–111)
Creatinine, Ser: 7.51 mg/dL — ABNORMAL HIGH (ref 0.61–1.24)
GFR, Estimated: 8 mL/min — ABNORMAL LOW (ref >60)
Glucose, Bld: 135 mg/dL — ABNORMAL HIGH (ref 70–99)
Phosphorus: 6.1 mg/dL — ABNORMAL HIGH (ref 2.5–4.6)
Potassium: 3.7 mmol/L (ref 3.5–5.1)
Sodium: 132 mmol/L — ABNORMAL LOW (ref 135–145)

## 2021-04-16 LAB — CBC
HCT: 31 % — ABNORMAL LOW (ref 39.0–52.0)
Hemoglobin: 9.8 g/dL — ABNORMAL LOW (ref 13.0–17.0)
MCH: 27.1 pg (ref 26.0–34.0)
MCHC: 31.6 g/dL (ref 30.0–36.0)
MCV: 85.6 fL (ref 80.0–100.0)
Platelets: 280 10*3/uL (ref 150–400)
RBC: 3.62 MIL/uL — ABNORMAL LOW (ref 4.22–5.81)
RDW: 14 % (ref 11.5–15.5)
WBC: 5.5 10*3/uL (ref 4.0–10.5)
nRBC: 0 % (ref 0.0–0.2)

## 2021-04-16 LAB — GLUCOSE, CAPILLARY
Glucose-Capillary: 139 mg/dL — ABNORMAL HIGH (ref 70–99)
Glucose-Capillary: 128 mg/dL — ABNORMAL HIGH (ref 70–99)

## 2021-04-16 MED ORDER — FUROSEMIDE 40 MG PO TABS
40.0000 mg | ORAL_TABLET | Freq: Every day | ORAL | 0 refills | Status: DC
Start: 2021-04-17 — End: 2021-06-11
  Filled 2021-04-16: qty 30, 30d supply, fill #0

## 2021-04-16 MED ORDER — HEPARIN SODIUM (PORCINE) 1000 UNIT/ML DIALYSIS
20.0000 [IU]/kg | INTRAMUSCULAR | Status: DC | PRN
  Administered 2021-04-16: 1500 [IU] via INTRAVENOUS_CENTRAL
  Filled 2021-04-16: qty 2

## 2021-04-16 MED ORDER — SERTRALINE HCL 50 MG PO TABS
50.0000 mg | ORAL_TABLET | Freq: Every day | ORAL | 0 refills | Status: DC
  Filled 2021-04-16: qty 30, 30d supply, fill #0

## 2021-04-16 MED ORDER — ALTEPLASE 2 MG IJ SOLR
3.8000 mg | Freq: Once | INTRAMUSCULAR | Status: AC
  Administered 2021-04-16: 3.8 mg
  Filled 2021-04-16: qty 4

## 2021-04-16 NOTE — TOC Progression Note (Addendum)
Transition of Care [facility]) - Progression Note    Patient Details  Name: NEHEMIAH MCFARREN MRN: [id number] Date of Birth: 06-Mar-1971  Transition of [facility]) CM/SW Contact  Zenon Mayo, RN Phone Number: 04/16/2021, 10:29 AM  Clinical Narrative:    NCM spoke with patient at bedside, he states he will have to call and see if he has transportation to get home.  He states he will need asst with medications if he has to get any.  TOC has filled meds for patient they will bill him for 8.00.     Expected Discharge Plan: Home/Self Care Barriers to Discharge: No Barriers Identified  Expected Discharge Plan and Services Expected Discharge Plan: Home/Self Care In-house Referral: Financial Counselor Discharge Planning Services: CM Consult Post Acute Care Choice: NA Living arrangements for the past 2 months: Single Family Home Expected Discharge Date: 04/16/21                 DME Agency: NA       HH Arranged: NA           Social Determinants of Health (SDOH) Interventions    Readmission Risk Interventions Readmission Risk Prevention Plan 02/19/2021  Transportation Screening Complete  Medication Review Press photographer) Complete  PCP or Specialist appointment within 3-5 days of discharge Complete  HRI or [place] Complete  SW Recovery Care/Counseling Consult Complete  [place] Not Applicable  Some recent data might be hidden

## 2021-04-16 NOTE — TOC Transition Note (Signed)
Transition of Care [facility]) - CM/SW Discharge Note   Patient Details  Name: [name] MRN: [id number] Date of Birth: 11-May-1971  Transition of Care [facility]) CM/SW Contact:  Zenon Mayo, RN Phone Number: 04/16/2021, 3:04 PM   Clinical Narrative:    NCM spoke with patient at bedside, he states he will have to call and see if he has transportation to get home.  He states he will need asst with medications if he has to get any.  TOC has filled meds for patient they will bill him for 8.00.       Final next level of care: Home/Self Care Barriers to Discharge: No Barriers Identified   Patient Goals and CMS Choice Patient states their goals for this hospitalization and ongoing recovery are:: go home   Choice offered to / list presented to : NA  Discharge Placement                       Discharge Plan and Services In-house Referral: Financial Counselor Discharge Planning Services: CM Consult Post Acute Care Choice: NA            DME Agency: NA       HH Arranged: NA          Social Determinants of Health (SDOH) Interventions     Readmission Risk Interventions Readmission Risk Prevention Plan 02/19/2021  Transportation Screening Complete  Medication Review Press photographer) Complete  PCP or Specialist appointment within 3-5 days of discharge Complete  HRI or [place] Complete  SW Recovery Care/Counseling Consult Complete  [place] Not Applicable  Some recent data might be hidden

## 2021-04-16 NOTE — Progress Notes (Signed)
   04/16/21 1735  Vitals  Temp 98.2 F (36.8 C)  Temp Source Oral  BP (!) 174/91  BP Location Left Wrist  BP Method Automatic  Patient Position (if appropriate) Lying  Pulse Rate 77  Pulse Rate Source Monitor  Resp 20  Oxygen Therapy  SpO2 100 %  O2 Device Room Air  Pain Assessment  Pain Scale 0-10  Pain Score 0  Dialysis Weight  Weight 73 kg  Type of Weight Post-Dialysis  Post-Hemodialysis Assessment  Rinseback Volume (mL) 250 mL  KECN 244 V  Dialyzer Clearance Lightly streaked  Duration of HD Treatment -hour(s) 3.5 hour(s)  Hemodialysis Intake (mL) 500 mL  UF Total -Machine (mL) 3500 mL  Net UF (mL) 3000 mL  Tolerated HD Treatment Yes  Post-Hemodialysis Comments tx complete-pt stable  Hemodialysis Catheter Left Subclavian Double lumen Permanent (Tunneled)  Placement Date/Time: 02/20/21 1341   Placed prior to admission: No  Time Out: Correct patient  Maximum sterile barrier precautions: Hand hygiene;Cap;Mask;Sterile gown;Sterile gloves  Site Prep: (c)   Local Anesthetic: None  Ultrasound Used?: Yes  Veri...  Site Condition No complications  Blue Lumen Status Flushed;Dead end cap in place;Heparin locked  Red Lumen Status Flushed;Dead end cap in place;Heparin locked  Catheter fill solution  (Cathflo)  Catheter fill volume (Arterial) 1.9 cc  Catheter fill volume (Venous) 1.9  Dressing Type Transparent  Dressing Status Clean;Dry;Intact  Antimicrobial disc in place? Yes  Interventions New dressing  Drainage Description None  Dressing Change Due 04/23/21  Post treatment catheter status Capped and Clamped  HD tx complete-pt stable. Difficulty with aspirating from arterial line. Blood lines reversed, Lindsey-PA notified and cath locked with cathflo. No issues throughout the tx.

## 2021-04-16 NOTE — Progress Notes (Signed)
Pt refusing all meds except long acting insulin, pt also refusing to go to dialysis. Md notified

## 2021-04-16 NOTE — Progress Notes (Signed)
Subjective:  refusing some meds and labs-  sleeping this AM-  did respond to my questions   Objective Vital signs in last 24 hours: Vitals:   04/16/21 1430 04/16/21 1500 04/16/21 1530 04/16/21 1600  BP: (!) 152/82 (!) 160/88 (!) 159/82 (!) 178/93  Pulse: 76 75 74 79  Resp: 19 18 15 17   Temp:      TempSrc:      SpO2:      Weight:      Height:       Weight change:   Intake/Output Summary (Last 24 hours) at 04/16/2021 1611 Last data filed at 04/16/2021 1356 Gross per 24 hour  Intake 180 ml  Output --  Net 180 ml     Dialysis Orders: Center: [place]  on TTS . Did not go for over 2 weeks  180NRe, BFR 400, DFR 500, EDW 73kg, 2K, 2.5Ca, TDC, Heparin 2000 unit bolus-  has AVF placed 02/20/21 Mircera 6mcg IV q 2 weeks- last dose 03/22/21 Calcitriol 0.25 mcg PO q HD   Assessment/Plan:  ESRD:  Normal schedule TTS, has not been to HD since 11/8. completed HD on 11/25 here.     discussion regarding compliance has been held-  he says he wants to do PD and im sure would want transplant-  needs to be more compliant in order for those things to come to fruition.  Plan HD again today off schedule.  Will also notify Kami and Linus Orn so that he can get more education-  consider the TCU.  I also started him on zoloft as he admitted that this has been a lot for him   Hypertension/volume: Mildly volume overloaded due to missed HD.  HD 11/25 with UFG 3.5L. Continue amlodipine, carvedilol and hydralazine on home low doses. He takes lasix 40mg  on non-HD days and does produce urine, will give lasix as well.  If he went to HD reg we likely could wean BP meds to a more simple regimen-  BP good this am-   will try to wean hydralazine -  decreased it to BID  Anemia: Hgb 9.2. Overdue for ESA due to missed HD, given aranesp with dialysis 66/29  Metabolic bone disease: Corrected calcium is 8.1. resumed calcitriol with HD. Phos 5.8, does not appear to be on a binder, started calcium acetate 1 per meal.   Nutrition:   Albumin is low, will start PO protein supplement.  Non-compliance with HD: Pt reports issues with transportation -  Would benefit from SW consult after the holiday. He told them that he does have an unlimited bus pass -  goes to [facility] so should be able to get there -  got the call from the OP unit that he would need to go to hospital if wants to return-  maybe could do TCU ?  Will get our education team involved tomorrow so that he can be more successful with OP HD  Dispo - per pmd is likely for dc after HD today   Kelly Splinter, MD 04/16/2021, 4:15 PM       Labs: Basic Metabolic Panel: Recent Labs  Lab 04/12/21 0317 04/13/21 0312 04/16/21 0644  NA 137 135 132*  K 4.3 4.5 3.7  CL 110 107 102  CO2 20* 18* 21*  GLUCOSE 122* 131* 135*  BUN 60* 61* 51*  CREATININE 6.79* 7.55* 7.51*  CALCIUM 6.4* 6.5* 7.3*  PHOS 5.8*  --  6.1*    Liver Function Tests: Recent Labs  Lab 04/11/21  2038 04/12/21 0317 04/16/21 0644  AST 37 29  --   ALT 29 26  --   ALKPHOS 150* 133*  --   BILITOT 0.5 0.3  --   PROT 6.0* 5.3*  --   ALBUMIN 2.2* 1.9* 1.9*    Recent Labs  Lab 04/11/21 2038  LIPASE 29    No results for input(s): AMMONIA in the last 168 hours. CBC: Recent Labs  Lab 04/11/21 2038 04/12/21 0317 04/13/21 0312 04/14/21 0919 04/16/21 0644  WBC 6.9 6.4 6.1 5.8 5.5  NEUTROABS  --   --  4.7 3.9  --   HGB 10.5* 9.2* 9.4* 9.6* 9.8*  HCT 35.5* 30.3* 30.1* 31.1* 31.0*  MCV 89.6 88.1 87.5 86.6 85.6  PLT 362 290 302 259 280    Cardiac Enzymes: No results for input(s): CKTOTAL, CKMB, CKMBINDEX, TROPONINI in the last 168 hours. CBG: Recent Labs  Lab 04/15/21 1220 04/15/21 1619 04/15/21 2125 04/16/21 0619 04/16/21 1117  GLUCAP 97 119* 161* 139* 128*     Iron Studies: No results for input(s): IRON, TIBC, TRANSFERRIN, FERRITIN in the last 72 hours. Studies/Results: No results found. Medications: Infusions:   Scheduled Medications:  alteplase  3.8 mg Intracatheter  Once   amLODipine  10 mg Oral Daily   atorvastatin  40 mg Oral Daily   calcitRIOL  0.25 mcg Oral Daily   calcium acetate  667 mg Oral TID WC   carvedilol  3.125 mg Oral BID WC   Chlorhexidine Gluconate Cloth  6 each Topical Q0600   darbepoetin (ARANESP) injection - DIALYSIS  40 mcg Intravenous Q Fri-HD   furosemide  40 mg Oral Daily   guaiFENesin  600 mg Oral BID   hydrALAZINE  25 mg Oral BID   insulin aspart  0-6 Units Subcutaneous TID WC   insulin glargine-yfgn  8 Units Subcutaneous Daily   sertraline  50 mg Oral Daily    have reviewed scheduled and prn medications.  Physical Exam: General:  not very interactive today again  Heart: RRR Lungs: mostly clear Abdomen: soft,non tender Extremities: min edema Dialysis Access: had TDC and right upper arm AVF-  maturing     04/16/2021,4:11 PM  LOS: 3 days

## 2021-04-16 NOTE — Discharge Summary (Signed)
Physician Discharge Summary  [name] BJY:[id number] DOB: 21-Apr-1971 DOA: 04/11/2021  PCP: Vevelyn Francois, NP  Admit date: 04/11/2021 Discharge date: 04/16/2021 30 Day Unplanned Readmission Risk Score    Flowsheet Row ED to Hosp-Admission (Current) from 04/11/2021 in [facility]  30 Day Unplanned Readmission Risk Score (%) 32.39 Filed at 04/16/2021 0801       This score is the patient's risk of an unplanned readmission within 30 days of being discharged (0 -100%). The score is based on dignosis, age, lab data, medications, orders, and past utilization.   Low:  0-14.9   Medium: 15-21.9   High: 22-29.9   Extreme: 30 and above          Admitted From: Home Disposition: Home  Recommendations for Outpatient Follow-up:  Follow up with PCP in 1-2 weeks Please obtain BMP/CBC in one week Follow-up with outpatient dialysis. Please follow up with your PCP on the following pending results: Unresulted Labs (From admission, onward)     Start     Ordered   04/13/21 0500  CBC with Differential/Platelet  Daily,   R      04/12/21 [place]: None Equipment/Devices: None  Discharge Condition: Stable CODE STATUS: Full code Diet recommendation: Low-sodium  Subjective: Seen and examined.  He has no complaints.  Brief/Interim Summary: [name] is a 50 y.o. male with PMH significant for ESRD HD TTS, DM2, HTN, HLD, chronically elevated troponin, history of suicidal ideation Patient was brought to the ED by EMS on 11/23 for chest pain while eating at [facility].  He apparently missed his dialysis for last 2 weeks. He also reported 3 to 4 days of progressive shortness of breath worsening bilateral pedal edema.  Reports that he was unable to get into dialysis for last 2 weeks because of transportation issues. Admits to noncompliance with his meds at home.   In the ED, blood pressure was elevated to 200/105 which improved significantly  after resumption of his oral meds.  O2 sat maintained on room air. Chest x-ray showed cardiomegaly with mild diffuse interstitial prominence suggestive of edema in the absence of infiltrate or pneumothorax Admitted to hospitalist service.  Nephrology consulted.  Detailed hospitalization as below.  Volume overload status ESRD HD TTS Missed dialysis for 2 weeks Nephrology consulted.  He received his dialysis.  Despite of extensive counseling provided by different physicians on board, nurses, nephrologist, patient continue to refuse his dialysis intermittently, he would often, with excuses that he is tired or he is not a mood to have dialysis and would ask to defer it until tomorrow.  He has been refusing to take some of the medications as well as allowing the phlebotomist to draw the labs intermittently as well.  This morning 2, he was a scheduled for dialysis but he refused to go there.  Dr. Jonnie Finner of nephrology talked to him and trying to convince him but he remained rigid and refused dialysis altogether and basically asked nephrologist to do it tomorrow because he is not in a mood today.  I then personally spoke to the patient in length, after spending 15 minutes of counseling with him where patient was mostly seemed to be not interested in having any conversation, as usual, he remained in the bed under his blankets with eyes closed and not making any eye contact and as usual in noting the questions asked and not answering  them until the same question was repeated 2-3 times.  Patient was informed that since there is no further medical necessity to keep him in the hospital and the fact that he has remained fever free for 48 hours with negative blood cultures, he will be discharged home today and it would be in his best interest to have dialysis before discharge.  Only after that, he agreed to have dialysis but demanded to start dialysis after lunch.  I notified nephrology and they have scheduled his dialysis  this afternoon.  I then informed patient's primary RN about the plan to do his dialysis in the afternoon and then discharge him home.  Despite of his agreement, based on his noncompliance history, I am still concerned that patient may end up refusing dialysis 1 more time.  He was informed that he remains sole responsible for all the complications and consequences of his noncompliance and refusing dialysis.   Atypical chest pain Chronically elevated troponin -Chest pain probably related to volume overload status -Troponin chronically elevated.  EKG without significant interval changes  Hypertensive urgency -Blood pressure elevated to over 200 at presentation.  Noncompliance to meds.  Blood pressure improved with resumption of home medications.   Type 2 diabetes mellitus Resume home medications. Hyperlipidemia -Lipitor   History of suicidal ideation/depression -Per chart, he had suicidal ideation in the past.  Does not have any comments like that during this hospitalization.  Per nephrology note, he did mention that he was depressed.  He has been started on Zoloft by nephrology and I am discharging him on Zoloft as well.   Nausea/diarrhea Resolved.   High-grade fever: Per chart, he had fever of 102.4 at around 9 PM on 04/13/2021.  Blood culture drawn on 04/14/2021 are negative thus far.  He is afebrile since then.  Unable to verify if that her recording was an error.  Nonetheless, he is stable and he is being discharged today.  Discharge Diagnoses:  Principal Problem:   Volume overload Active Problems:   Hyperlipidemia LDL goal <70   Essential hypertension   End stage renal disease (HCC)   Atypical chest pain   DM2 (diabetes mellitus, type 2) ([place])    Discharge Instructions   Allergies as of 04/16/2021       Reactions   Sulfa Antibiotics Rash   Severe rash. Do not give sulfa medications   Bactrim [sulfamethoxazole-trimethoprim]    Rash   Cephalexin    Patient with severe  drug reaction including exfoliating skin rash and hypotension after being prescribed both cephalexin and sulfamethoxazole-trimethoprim simultaneously. Favor SMX as much more likely culprit as patient had tolerated other cephalosporins in the past, but cannot say with complete certainty that this was not related to cephalexin.    Other    Patient states he is allergic to the TB test        Medication List     TAKE these medications    Accu-Chek Guide test strip Generic drug: glucose blood Use to check fasting blood sugar once daily. 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 Insulin dependent   Accu-Chek Softclix Lancets lancets USE TO MEASURE BLOOD SUGAR TWICE A DAY   amLODipine 10 MG tablet Commonly known as: Norvasc Take 1 tablet by mouth once a day   atorvastatin 40 MG tablet Commonly known as: LIPITOR Take 1 tablet (40 mg total) by mouth daily.   calcitRIOL 0.25 MCG capsule Commonly known as: ROCALTROL Take 1 capsule (0.25 mcg total) by mouth daily.   carvedilol 12.5 MG  tablet Commonly known as: COREG Take 1 tablet (12.54m total) by mouth two times a day with a meal.   furosemide 40 MG tablet Commonly known as: LASIX Take 1 tablet (40 mg total) by mouth daily. Start taking on: April 17, 2021   heparin 1000 unit/mL Soln injection Heparin Sodium (Porcine) 1,000 Units/mL Catheter Lock Arterial   hydrALAZINE 25 MG tablet Commonly known as: APRESOLINE Take 1 tablet by mouth three times a day   Lantus SoloStar 100 UNIT/ML Solostar Pen Generic drug: insulin glargine Inject 25 Units into the skin daily. May substitute with any other long-acting formulary.   MIRCERA IJ Mircera   NovoLOG FlexPen 100 UNIT/ML FlexPen Generic drug: insulin aspart Inject 5 Units into the skin 2 (two) times daily with a meal.   Pentips 32G X 4 MM Misc Generic drug: Insulin Pen Needle Use with insulin pens   sertraline 50 MG tablet Commonly known as: ZOLOFT Take 1 tablet (50 mg total) by mouth  daily. Start taking on: April 17, 2021   True Metrix Meter w/Device Kit Use to measure blood sugar twice a day   Accu-Chek Guide w/Device Kit Use as directed        Follow-up Information     KVevelyn Francois NP. Go on 04/20/2021.   Specialty: Adult Health Nurse Practitioner Why: @10 :2[place] Quantinformation: [address]ARenee HarderGreensboro [place] [phone]         BLorretta Harp MD .   Specialties: Cardiology, Radiology Contact information: [id number] NP Follow up in 1 week(s).   Specialty: Adult Health Nurse Practitioner Contact information: [address]enee HarderGreensboro [place] [phone]         BLorretta Harp MD .   Specialties: Cardiology, Radiology Contact information: [phone]               Allergies  Allergen Reactions   Sulfa Antibiotics Rash    Severe rash. Do not give sulfa medications   Bactrim [Sulfamethoxazole-Trimethoprim]     Rash   Cephalexin     Patient with severe drug reaction including exfoliating skin rash and hypotension after being prescribed both cephalexin and sulfamethoxazole-trimethoprim simultaneously. Favor SMX as much more likely culprit as patient had tolerated other cephalosporins in the past, but cannot say with complete certainty that this was not related to cephalexin.    Other     Patient states he is allergic to the TB test    Consultations: Nephrology   Procedures/Studies: DG Chest 1 View  Result Date: 04/11/2021 CLINICAL DATA:  Chest pain.  Missed dialysis. EXAM: CHEST  1 VIEW COMPARISON:  04/08/2021 FINDINGS: Left IJ central venous catheter is stable in positioning. Stable cardiomegaly. Similar mild diffuse interstitial prominence. No new airspace consolidation. No pleural effusion or pneumothorax. IMPRESSION: Cardiomegaly with persistent mild  diffuse interstitial prominence, likely mild edema. Electronically Signed   By: NDavina PokeD.O.   On: 04/11/2021 21:18   DG Chest Port 1 View  Result Date: 04/08/2021 CLINICAL DATA:  Chest pain EXAM: PORTABLE CHEST 1 VIEW COMPARISON:  03/13/2021 FINDINGS: Cardiomegaly. Left chest large bore multi lumen vascular catheter. Mild, diffuse bilateral interstitial pulmonary opacity. IMPRESSION: Cardiomegaly with mild, diffuse bilateral interstitial pulmonary opacity, likely edema. No focal airspace opacity. Electronically Signed   By: ADelanna AhmadiM.D.   On: 04/08/2021  16:11     Discharge Exam: Vitals:   04/16/21 0727 04/16/21 1119  BP: 125/71 125/79  Pulse: 72 72  Resp: 17 18  Temp: 98.9 F (37.2 C) 98.1 F (36.7 C)  SpO2: (!) 89% (!) 84%   Vitals:   04/16/21 0300 04/16/21 0342 04/16/21 0727 04/16/21 1119  BP:  (!) 119/53 125/71 125/79  Pulse:  77 72 72  Resp:  17 17 18   Temp:  98.1 F (36.7 C) 98.9 F (37.2 C) 98.1 F (36.7 C)  TempSrc:  Oral Oral   SpO2:  90% (!) 89% (!) 84%  Weight: 79.3 kg     Height:        General: Pt is sleepy in the bed under his blankets. Cardiovascular: RRR, S1/S2 +, no rubs, no gallops Respiratory: CTA bilaterally, no wheezing, no rhonchi Abdominal: Soft, NT, ND, bowel sounds + Extremities: +1 pitting edema bilateral lower extremity, no cyanosis.    The results of significant diagnostics from this hospitalization (including imaging, microbiology, ancillary and laboratory) are listed below for reference.     Microbiology: Recent Results (from the past 240 hour(s))  Resp Panel by RT-PCR (Flu A&B, Covid) Nasopharyngeal Swab     Status: None   Collection Time: 04/12/21  2:26 AM   Specimen: Nasopharyngeal Swab; Nasopharyngeal(NP) swabs in vial transport medium  Result Value Ref Range Status   SARS Coronavirus 2 by RT PCR NEGATIVE NEGATIVE Final    Comment: (NOTE) SARS-CoV-2 target nucleic acids are NOT DETECTED.  The SARS-CoV-2 RNA is  generally detectable in upper respiratory specimens during the acute phase of infection. The lowest concentration of SARS-CoV-2 viral copies this assay can detect is 138 copies/mL. A negative result does not preclude SARS-Cov-2 infection and should not be used as the sole basis for treatment or other patient management decisions. A negative result may occur with  improper specimen collection/handling, submission of specimen other than nasopharyngeal swab, presence of viral mutation(s) within the areas targeted by this assay, and inadequate number of viral copies(<138 copies/mL). A negative result must be combined with clinical observations, patient history, and epidemiological information. The expected result is Negative.  Fact Sheet for Patients:  EntrepreneurPulse.com.au  Fact Sheet for Healthcare Providers:  IncredibleEmployment.be  This test is no t yet approved or cleared by the [place] FDA and  has been authorized for detection and/or diagnosis of SARS-CoV-2 by FDA under an Emergency Use Authorization (EUA). This EUA will remain  in effect (meaning this test can be used) for the duration of the COVID-19 declaration under Section 564(b)(1) of the Act, 21 U.S.C.section 360bbb-3(b)(1), unless the authorization is terminated  or revoked sooner.       Influenza A by PCR NEGATIVE NEGATIVE Final   Influenza B by PCR NEGATIVE NEGATIVE Final    Comment: (NOTE) The Xpert Xpress SARS-CoV-2/FLU/RSV plus assay is intended as an aid in the diagnosis of influenza from Nasopharyngeal swab specimens and should not be used as a sole basis for treatment. Nasal washings and aspirates are unacceptable for Xpert Xpress SARS-CoV-2/FLU/RSV testing.  Fact Sheet for Patients: EntrepreneurPulse.com.au  Fact Sheet for Healthcare Providers: IncredibleEmployment.be  This test is not yet approved or cleared by the [place] FDA and has been authorized for detection and/or diagnosis of SARS-CoV-2 by FDA under an Emergency Use Authorization (EUA). This EUA will remain in effect (meaning this test can be used) for the duration of the COVID-19 declaration under Section 564(b)(1) of the Act, 21 U.S.C. section 360bbb-3(b)(1), unless  the authorization is terminated or revoked.  Performed at [facility], [place] [address] [place], [place] [address]   Culture, blood (routine x 2)     Status: None (Preliminary result)   Collection Time: 04/14/21  9:18 AM   Specimen: BLOOD LEFT HAND  Result Value Ref Range Status   Specimen Description BLOOD LEFT HAND  Final   Special Requests   Final    BOTTLES DRAWN AEROBIC AND ANAEROBIC Blood Culture results may not be optimal due to an inadequate volume of blood received in culture bottles   Culture   Final    NO GROWTH 2 DAYS Performed at [facility], [place] [address], [place], [place] [address]    Report Status PENDING  Incomplete  Culture, blood (routine x 2)     Status: None (Preliminary result)   Collection Time: 04/14/21  9:20 AM   Specimen: BLOOD  Result Value Ref Range Status   Specimen Description BLOOD THUMB  Final   Special Requests   Final    BOTTLES DRAWN AEROBIC AND ANAEROBIC Blood Culture results may not be optimal due to an inadequate volume of blood received in culture bottles   Culture   Final    NO GROWTH 2 DAYS Performed at [facility], [place] [address] [place], [place] [address]    Report Status PENDING  Incomplete     Labs: BNP (last 3 results) Recent Labs    11/27/20 0609 02/17/21 1527 03/13/21 1248  BNP 1,009.9* 1,329.8* 0,240.9*   Basic Metabolic Panel: Recent Labs  Lab 04/11/21 2038 04/12/21 0317 04/13/21 0312 04/16/21 0644  NA 137 137 135 132*  K 4.0 4.3 4.5 3.7  CL 106 110 107 102  CO2 21* 20* 18* 21*  GLUCOSE 176* 122* 131* 135*  BUN 59* 60* 61* 51*  CREATININE 6.89* 6.79* 7.55* 7.51*  CALCIUM 6.6*  6.4* 6.5* 7.3*  MG  --  1.7  --   --   PHOS  --  5.8*  --  6.1*   Liver Function Tests: Recent Labs  Lab 04/11/21 2038 04/12/21 0317 04/16/21 0644  AST 37 29  --   ALT 29 26  --   ALKPHOS 150* 133*  --   BILITOT 0.5 0.3  --   PROT 6.0* 5.3*  --   ALBUMIN 2.2* 1.9* 1.9*   Recent Labs  Lab 04/11/21 2038  LIPASE 29   No results for input(s): AMMONIA in the last 168 hours. CBC: Recent Labs  Lab 04/11/21 2038 04/12/21 0317 04/13/21 0312 04/14/21 0919 04/16/21 0644  WBC 6.9 6.4 6.1 5.8 5.5  NEUTROABS  --   --  4.7 3.9  --   HGB 10.5* 9.2* 9.4* 9.6* 9.8*  HCT 35.5* 30.3* 30.1* 31.1* 31.0*  MCV 89.6 88.1 87.5 86.6 85.6  PLT 362 290 302 259 280   Cardiac Enzymes: No results for input(s): CKTOTAL, CKMB, CKMBINDEX, TROPONINI in the last 168 hours. BNP: Invalid input(s): POCBNP CBG: Recent Labs  Lab 04/15/21 1220 04/15/21 1619 04/15/21 2125 04/16/21 0619 04/16/21 1117  GLUCAP 97 119* 161* 139* 128*   D-Dimer No results for input(s): DDIMER in the last 72 hours. Hgb A1c No results for input(s): HGBA1C in the last 72 hours. Lipid Profile No results for input(s): CHOL, HDL, LDLCALC, TRIG, CHOLHDL, LDLDIRECT in the last 72 hours. Thyroid function studies No results for input(s): TSH, T4TOTAL, T3FREE, THYROIDAB in the last 72 hours.  Invalid input(s): FREET3 Anemia work up  No results for input(s): VITAMINB12, FOLATE, FERRITIN, TIBC, IRON, RETICCTPCT in the last 72 hours. Urinalysis    Component Value Date/Time   COLORURINE YELLOW 02/19/2021 0045   APPEARANCEUR HAZY (A) 02/19/2021 0045   APPEARANCEUR Clear 05/09/2020 1509   LABSPEC 1.013 02/19/2021 0045   PHURINE 5.0 02/19/2021 0045   GLUCOSEU >=500 (A) 02/19/2021 0045   HGBUR MODERATE (A) 02/19/2021 0045   BILIRUBINUR NEGATIVE 02/19/2021 0045   BILIRUBINUR Negative 05/09/2020 1509   KETONESUR NEGATIVE 02/19/2021 0045   PROTEINUR >=300 (A) 02/19/2021 0045   UROBILINOGEN 0.2 02/04/2020 1612   UROBILINOGEN  0.2 02/16/2014 0045   NITRITE NEGATIVE 02/19/2021 0045   LEUKOCYTESUR NEGATIVE 02/19/2021 0045   Sepsis Labs Invalid input(s): PROCALCITONIN,  WBC,  LACTICIDVEN Microbiology Recent Results (from the past 240 hour(s))  Resp Panel by RT-PCR (Flu A&B, Covid) Nasopharyngeal Swab     Status: None   Collection Time: 04/12/21  2:26 AM   Specimen: Nasopharyngeal Swab; Nasopharyngeal(NP) swabs in vial transport medium  Result Value Ref Range Status   SARS Coronavirus 2 by RT PCR NEGATIVE NEGATIVE Final    Comment: (NOTE) SARS-CoV-2 target nucleic acids are NOT DETECTED.  The SARS-CoV-2 RNA is generally detectable in upper respiratory specimens during the acute phase of infection. The lowest concentration of SARS-CoV-2 viral copies this assay can detect is 138 copies/mL. A negative result does not preclude SARS-Cov-2 infection and should not be used as the sole basis for treatment or other patient management decisions. A negative result may occur with  improper specimen collection/handling, submission of specimen other than nasopharyngeal swab, presence of viral mutation(s) within the areas targeted by this assay, and inadequate number of viral copies(<138 copies/mL). A negative result must be combined with clinical observations, patient history, and epidemiological information. The expected result is Negative.  Fact Sheet for Patients:  EntrepreneurPulse.com.au  Fact Sheet for Healthcare Providers:  IncredibleEmployment.be  This test is no t yet approved or cleared by the [place] FDA and  has been authorized for detection and/or diagnosis of SARS-CoV-2 by FDA under an Emergency Use Authorization (EUA). This EUA will remain  in effect (meaning this test can be used) for the duration of the COVID-19 declaration under Section 564(b)(1) of the Act, 21 U.S.C.section 360bbb-3(b)(1), unless the authorization is terminated  or revoked sooner.        Influenza A by PCR NEGATIVE NEGATIVE Final   Influenza B by PCR NEGATIVE NEGATIVE Final    Comment: (NOTE) The Xpert Xpress SARS-CoV-2/FLU/RSV plus assay is intended as an aid in the diagnosis of influenza from Nasopharyngeal swab specimens and should not be used as a sole basis for treatment. Nasal washings and aspirates are unacceptable for Xpert Xpress SARS-CoV-2/FLU/RSV testing.  Fact Sheet for Patients: EntrepreneurPulse.com.au  Fact Sheet for Healthcare Providers: IncredibleEmployment.be  This test is not yet approved or cleared by the [place] FDA and has been authorized for detection and/or diagnosis of SARS-CoV-2 by FDA under an Emergency Use Authorization (EUA). This EUA will remain in effect (meaning this test can be used) for the duration of the COVID-19 declaration under Section 564(b)(1) of the Act, 21 U.S.C. section 360bbb-3(b)(1), unless the authorization is terminated or revoked.  Performed at [facility], [place] [address], [place],  [address]   Culture, blood (routine x 2)     Status: None (Preliminary result)   Collection Time: 04/14/21  9:18 AM   Specimen: BLOOD LEFT HAND  Result Value Ref Range Status   Specimen Description  BLOOD LEFT HAND  Final   Special Requests   Final    BOTTLES DRAWN AEROBIC AND ANAEROBIC Blood Culture results may not be optimal due to an inadequate volume of blood received in culture bottles   Culture   Final    NO GROWTH 2 DAYS Performed at [facility], [place] [address] [place], [place] [address]    Report Status PENDING  Incomplete  Culture, blood (routine x 2)     Status: None (Preliminary result)   Collection Time: 04/14/21  9:20 AM   Specimen: BLOOD  Result Value Ref Range Status   Specimen Description BLOOD THUMB  Final   Special Requests   Final    BOTTLES DRAWN AEROBIC AND ANAEROBIC Blood Culture results may not be optimal due to an inadequate volume of blood  received in culture bottles   Culture   Final    NO GROWTH 2 DAYS Performed at [facility], [place] [address] [place] [address], [place],  [address]    Report Status PENDING  Incomplete     Time coordinating discharge: Over 30 minutes  SIGNED:   [name], MD  [facility] 04/16/2021, 11:34 AM  If 7PM-7AM, please contact night-coverage [email or web address]

## 2021-04-16 NOTE — Progress Notes (Signed)
Requested by Dr Moshe Cipro to see pt. Pt is for d/c to home today. Met with pt at bedside regarding pt missing out-pt HD appts. Pt voices concerns regarding transportation with [facility]. Pt states that some drivers pick pt up at his trailer and other drivers go to the trailer park office to attempt to pick pt up/locate pt. Pt says that if he is not available for pickup in 5 mins then transportation leaves and he does not have transport to HD appt. Pt states that he has passes at home for transportation as long as he is picked up. Navigator offered to contact [facility] to discuss the above issue and to make pt's reservations for transportation this week. Pt agreeable for navigator to contact [facility] to assist with the above matter. Spoke to [place] at [facility] [employer] [phone]) regarding pt's issues with transportation. Pt's address was corrected to the address that pt provided to navigator. Also scheduled pt's transportation for this week. Pt will need to be ready by 10:00. Transport to arrive between 10:00-10:30. Transport will return to out-pt HD clinic between 4:15-4:45 to transport pt back home. Roshell made a note on pt's account for driver to not stop at the office but to proceed to pt's trailer to pick pt up. Pt advised of the above info. Pt was also provided the phone number for dispatch [phone]) should pt experience any further issues. Written information provided to pt with above instructions. Also placed on pt's AVS. Pt aware that he can contact [facility] to request a standing order for transportation but pt/family needs to call to request/confirm this with [facility]. Discussed with pt the importance of compliance with HD treatments especially if pt is interested in a transplant or PD treatments in the home. MD inquired if pt would be appropriate for TCU. Explained TCU to pt and that program requires pts to receive treatment 4x's a week. Pt states he will need to think about this option and  reminded pt that he will need to show compliance as well. Contacted [facility] [facility] and spoke to [facility] regarding pt's d/c today and pt to resume care tomorrow. Also advised clinic of pt's possible interest in PD in the future should pt be able to prove compliance. Pt's clinic would need to pursue referral to TCU should that be an appropriate option in the future.   Melven Sartorius Renal Navigator [phone]

## 2021-04-17 ENCOUNTER — Telehealth: Payer: Self-pay | Admitting: Nephrology

## 2021-04-17 NOTE — Telephone Encounter (Signed)
Transition of Care Contact from [place]  Date of Discharge: 04/16/21 Date of Contact: 04/17/21 Method of contact: phone - attempted  Attempted to contact patient to discuss transition of care from inpatient admission.  Patient did not answer the phone.  Will attempt to call again and if unable to reach will follow up at dialysis.  Jen Mow, PA-C [facility] Pager: [phone]

## 2021-04-19 LAB — CULTURE, BLOOD (ROUTINE X 2)
Culture: NO GROWTH
Culture: NO GROWTH

## 2021-04-20 ENCOUNTER — Other Ambulatory Visit: Payer: Self-pay

## 2021-04-20 ENCOUNTER — Ambulatory Visit: Payer: Self-pay | Admitting: Nurse Practitioner

## 2021-04-20 ENCOUNTER — Encounter (HOSPITAL_COMMUNITY): Payer: Self-pay | Admitting: Emergency Medicine

## 2021-04-20 ENCOUNTER — Emergency Department (HOSPITAL_COMMUNITY)
Admission: EM | Admit: 2021-04-20 | Discharge: 2021-04-20 | Disposition: A | Discharge status: Home / Self Care | Payer: Medicaid Other | Attending: Emergency Medicine | Admitting: Emergency Medicine

## 2021-04-20 VITALS — BP 200/100 | HR 90 | Temp 99.3°F | Resp 18

## 2021-04-20 DIAGNOSIS — D631 Anemia in chronic kidney disease: Secondary | ICD-10-CM | POA: Insufficient documentation

## 2021-04-20 DIAGNOSIS — I132 Hypertensive heart and chronic kidney disease with heart failure and with stage 5 chronic kidney disease, or end stage renal disease: Secondary | ICD-10-CM | POA: Diagnosis not present

## 2021-04-20 DIAGNOSIS — Z794 Long term (current) use of insulin: Secondary | ICD-10-CM | POA: Insufficient documentation

## 2021-04-20 DIAGNOSIS — Z992 Dependence on renal dialysis: Secondary | ICD-10-CM | POA: Diagnosis not present

## 2021-04-20 DIAGNOSIS — Z79899 Other long term (current) drug therapy: Secondary | ICD-10-CM | POA: Insufficient documentation

## 2021-04-20 DIAGNOSIS — R600 Localized edema: Secondary | ICD-10-CM | POA: Diagnosis not present

## 2021-04-20 DIAGNOSIS — N186 End stage renal disease: Secondary | ICD-10-CM | POA: Insufficient documentation

## 2021-04-20 DIAGNOSIS — I5041 Acute combined systolic (congestive) and diastolic (congestive) heart failure: Secondary | ICD-10-CM | POA: Diagnosis not present

## 2021-04-20 DIAGNOSIS — R1084 Generalized abdominal pain: Secondary | ICD-10-CM | POA: Diagnosis present

## 2021-04-20 DIAGNOSIS — Z9115 Patient's noncompliance with renal dialysis: Secondary | ICD-10-CM

## 2021-04-20 DIAGNOSIS — E1122 Type 2 diabetes mellitus with diabetic chronic kidney disease: Secondary | ICD-10-CM | POA: Insufficient documentation

## 2021-04-20 DIAGNOSIS — Z515 Encounter for palliative care: Secondary | ICD-10-CM

## 2021-04-20 LAB — COMPREHENSIVE METABOLIC PANEL
ALT: 22 U/L (ref 0–44)
AST: 27 U/L (ref 15–41)
Albumin: 2.3 g/dL — ABNORMAL LOW (ref 3.5–5.0)
Alkaline Phosphatase: 118 U/L (ref 38–126)
Anion gap: 10 (ref 5–15)
BUN: 36 mg/dL — ABNORMAL HIGH (ref 6–20)
CO2: 23 mmol/L (ref 22–32)
Calcium: 7.2 mg/dL — ABNORMAL LOW (ref 8.9–10.3)
Chloride: 105 mmol/L (ref 98–111)
Creatinine, Ser: 6.54 mg/dL — ABNORMAL HIGH (ref 0.61–1.24)
GFR, Estimated: 10 mL/min — ABNORMAL LOW (ref >60)
Glucose, Bld: 98 mg/dL (ref 70–99)
Potassium: 3.7 mmol/L (ref 3.5–5.1)
Sodium: 138 mmol/L (ref 135–145)
Total Bilirubin: 0.6 mg/dL (ref 0.3–1.2)
Total Protein: 6.4 g/dL — ABNORMAL LOW (ref 6.5–8.1)

## 2021-04-20 LAB — I-STAT CHEM 8, ED
BUN: 35 mg/dL — ABNORMAL HIGH (ref 6–20)
Calcium, Ion: 0.88 mmol/L — CL (ref 1.15–1.40)
Chloride: 106 mmol/L (ref 98–111)
Creatinine, Ser: 6.9 mg/dL — ABNORMAL HIGH (ref 0.61–1.24)
Glucose, Bld: 93 mg/dL (ref 70–99)
HCT: 38 % — ABNORMAL LOW (ref 39.0–52.0)
Hemoglobin: 12.9 g/dL — ABNORMAL LOW (ref 13.0–17.0)
Potassium: 3.7 mmol/L (ref 3.5–5.1)
Sodium: 140 mmol/L (ref 135–145)
TCO2: 22 mmol/L (ref 22–32)

## 2021-04-20 LAB — CBC WITH DIFFERENTIAL/PLATELET
Abs Immature Granulocytes: 0.03 10*3/uL (ref 0.00–0.07)
Basophils Absolute: 0 10*3/uL (ref 0.0–0.1)
Basophils Relative: 0 %
Eosinophils Absolute: 0.2 10*3/uL (ref 0.0–0.5)
Eosinophils Relative: 2 %
HCT: 37.9 % — ABNORMAL LOW (ref 39.0–52.0)
Hemoglobin: 11.4 g/dL — ABNORMAL LOW (ref 13.0–17.0)
Immature Granulocytes: 0 %
Lymphocytes Relative: 14 %
Lymphs Abs: 1.3 10*3/uL (ref 0.7–4.0)
MCH: 26.5 pg (ref 26.0–34.0)
MCHC: 30.1 g/dL (ref 30.0–36.0)
MCV: 87.9 fL (ref 80.0–100.0)
Monocytes Absolute: 0.7 10*3/uL (ref 0.1–1.0)
Monocytes Relative: 8 %
Neutro Abs: 7 10*3/uL (ref 1.7–7.7)
Neutrophils Relative %: 76 %
Platelets: 454 10*3/uL — ABNORMAL HIGH (ref 150–400)
RBC: 4.31 MIL/uL (ref 4.22–5.81)
RDW: 14.1 % (ref 11.5–15.5)
WBC: 9.3 10*3/uL (ref 4.0–10.5)
nRBC: 0 % (ref 0.0–0.2)

## 2021-04-20 LAB — LIPASE, BLOOD: Lipase: 25 U/L (ref 11–51)

## 2021-04-20 MED ORDER — HYDRALAZINE HCL 25 MG PO TABS
25.0000 mg | ORAL_TABLET | Freq: Once | ORAL | Status: AC
  Administered 2021-04-20: 25 mg via ORAL
  Filled 2021-04-20 (×2): qty 1

## 2021-04-20 MED ORDER — CARVEDILOL 12.5 MG PO TABS
12.5000 mg | ORAL_TABLET | Freq: Once | ORAL | Status: AC
  Administered 2021-04-20: 12.5 mg via ORAL
  Filled 2021-04-20: qty 1

## 2021-04-20 NOTE — ED Triage Notes (Signed)
Patient BIB [facility] after patient called 911 due to missing 2x dialysis appointments. Patient has been referred to palliative care regarding his frequent missed dialysis appointments but patient is unsure how he wants to proceed whether he wants to continue dialysis or not.  200/110 SpO2 91% on room air
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
## 2021-04-20 NOTE — ED Notes (Signed)
Pt does not want to be placed on monitor.

## 2021-04-20 NOTE — Discharge Instructions (Signed)
Please follow up tomorrow (Saturday) morning for dialysis as scheduled.  Return to the ED with any worsening abdominal pain, fevers, difficulty breathing.

## 2021-04-20 NOTE — Progress Notes (Signed)
[place] [facility] Hospital Liaison note:  This patient is currently enrolled in [facility] outpatient-based Palliative Care. Will continue to follow for disposition.  Please call with any outpatient palliative questions or concerns.  Thank you, Dee Curry, LPN [facility] Hospital Liaison [phone] 

## 2021-04-20 NOTE — ED Provider Notes (Signed)
[facility] EMERGENCY DEPARTMENT Provider Note   CSN: [id number] Arrival date & time: 04/20/21  1410     History Chief Complaint  Patient presents with   Missed Dialysis    [name] is a 50 y.o. male.  The history is provided by the patient and medical records.  Abdominal Pain Pain location:  Generalized Pain quality: bloating   Pain radiates to:  Does not radiate Pain severity:  Mild Onset quality:  Gradual Duration:  1 week Progression:  Unchanged Context comment:  Missed dialysis x 2, last session Monday Relieved by:  Nothing Worsened by:  Nothing Associated symptoms: cough and diarrhea   Associated symptoms: no chest pain, no chills, no dysuria, no fever, no hematuria, no shortness of breath, no sore throat and no vomiting   Risk factors: obesity       Past Medical History:  Diagnosis Date   Anxiety 02/2019   Anxiety 10/2019   Diabetes mellitus type II, uncontrolled 07/17/2006   Elevated alkaline phosphatase level 01/2019   ESSENTIAL HYPERTENSION 07/17/2006   HYPERLIPIDEMIA 11/24/2008   Insomnia 02/2019   Left bundle branch block 02/22/2019   Lesion of lip 10/2019   Pneumonia 10/22/2011   Suicidal ideations 02/2019   Vitamin D deficiency 01/2019    Patient Active Problem List   Diagnosis Date Noted   Volume overload 04/12/2021   Atypical chest pain 04/12/2021   Mild protein-calorie malnutrition ([place]) 03/06/2021   Aftercare including intermittent dialysis ([place]) 02/27/2021   Allergy, unspecified, initial encounter 02/27/2021   Anaphylactic shock, unspecified, initial encounter 02/27/2021   Anemia in chronic kidney disease 02/27/2021   Diarrhea, unspecified 02/27/2021   End stage renal disease ([place]) 02/27/2021   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   Pruritus, unspecified 02/27/2021   Secondary hyperparathyroidism of renal origin ([place]) 02/27/2021   Shortness of breath 02/27/2021   Fever, unspecified 02/27/2021    Type 2 diabetes mellitus with other diabetic kidney complication ([place]) 52/77/8242   Hypertensive urgency 02/18/2021   Type 2 diabetes mellitus ([place]) 02/17/2021   Acute combined systolic and diastolic congestive heart failure ([place]) 11/29/2020   Hypertensive crisis 11/27/2020   Lower extremity edema 03/07/2020   Stage 4 chronic kidney disease ([place]) 02/22/2020   Left ventricular dysfunction 04/27/2019   Hemoglobin A1C greater than 9%, indicating poor diabetic control 07/29/2018   New onset left bundle branch block (LBBB)    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   Hyperlipidemia LDL goal <70 11/24/2008   Uncontrolled type 2 diabetes mellitus with peripheral neuropathy 07/17/2006   Essential hypertension 07/17/2006   DM2 (diabetes mellitus, type 2) ([place]) 07/17/2006    Past Surgical History:  Procedure Laterality Date   ARM SURGERY     AV FISTULA PLACEMENT Right 02/20/2021   Procedure: RIGHT ARTERIOVENOUS GRAFT CREATION;  Surgeon: Angelia Mould, MD;  Location: [facility];  Service: Vascular;  Laterality: Right;   INSERTION OF DIALYSIS CATHETER Left 02/20/2021   Procedure: INSERTION OF DIALYSIS CATHETER USING PALINDROME CATHETER;  Surgeon: Angelia Mould, MD;  Location[facility] OR;  Service: Vascular;  Laterality: Left;   ULTRASOUND GUIDANCE FOR VASCULAR ACCESS  02/20/2021   Procedure: ULTRASOUND GUIDANCE FOR VASCULAR ACCESS;  Surgeon: Angelia Mould, MD;  Location: [facility] OR;  Service: Vascular;;       Family History  Problem Relation Age of Onset   Hypertension Mother 5   Cancer Mother        bladder cancer   Alcohol abuse Father  Hypertension Brother     Social History   Tobacco Use   Smoking status: Never   Smokeless tobacco: Never  Vaping Use   Vaping Use: Never used  Substance Use Topics   Alcohol use: No    Alcohol/week: 0.0 standard drinks   Drug use: No    Home Medications Prior to Admission medications   Medication Sig Start Date End  Date Taking? Authorizing Provider  amLODipine (NORVASC) 10 MG tablet Take 1 tablet by mouth once a day 03/22/21     atorvastatin (LIPITOR) 40 MG tablet Take 1 tablet (40 mg total) by mouth daily. 02/26/21   [name], [name], MD  Blood Glucose Monitoring Suppl (ACCU-CHEK GUIDE) w/Device KIT Use as directed 11/29/20   [name], MD  Blood Glucose Monitoring Suppl (TRUE METRIX METER) w/Device KIT Use to measure blood sugar twice a day 02/22/20   [name], MD  calcitRIOL (ROCALTROL) 0.25 MCG capsule Take 1 capsule (0.25 mcg total) by mouth daily. 02/26/21   [name], [name], MD  carvedilol (COREG) 12.5 MG tablet Take 1 tablet (12.17m total) by mouth two times a day with a meal. 03/22/21     furosemide (LASIX) 40 MG tablet Take 1 tablet (40 mg total) by mouth daily. 04/17/21 05/17/21  PDarliss Cheney MD  glucose blood test strip Use to check fasting blood sugar once daily. 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 Insulin dependent 11/29/20   KAnnita Brod MD  heparin 1000 unit/mL SOLN injection Heparin Sodium (Porcine) 1,000 Units/mL Catheter Lock Arterial 02/27/21 02/26/22  [provider]  hydrALAZINE (APRESOLINE) 25 MG tablet Take 1 tablet by mouth three times a day 03/22/21     insulin aspart (NOVOLOG FLEXPEN) 100 UNIT/ML FlexPen Inject 5 Units into the skin 2 (two) times daily with a meal. 02/26/21   [name], DSilver Huguenin MD  insulin glargine (LANTUS) 100 UNIT/ML Solostar Pen Inject 25 Units into the skin daily. May substitute with any other long-acting formulary. 02/26/21   [name], DSilver Huguenin MD  Insulin Pen Needle 32G X 4 MM MISC Use with insulin pens 02/26/21   [name], DSilver Huguenin MD  Methoxy PEG-Epoetin Beta (MIRCERA IJ) Mircera 03/08/21 03/07/22  [provider]  sertraline (ZOLOFT) 50 MG tablet Take 1 tablet (50 mg total) by mouth daily. 04/17/21 05/17/21  PDarliss Cheney MD  TRUEplus Lancets 28G MISC USE TO MEASURE BLOOD SUGAR TWICE A DAY 11/29/20 11/29/21  KAnnita Brod  MD  gabapentin (NEURONTIN) 300 MG capsule Take 1 capsule (300 mg total) by mouth 3 (three) times daily. Patient not taking: Reported on 07/27/2019 07/29/18 08/30/19  SAzzie Glatter FNP  insulin detemir (LEVEMIR FLEXTOUCH) 100 UNIT/ML FlexPen Inject 35 Units into the skin daily. Patient not taking: Reported on 02/17/2021 11/29/20 02/24/21  KAnnita Brod MD  sildenafil (VIAGRA) 25 MG tablet Take 1 tablet (25 mg total) by mouth as needed for erectile dysfunction. for erectile dysfunction. Patient not taking: Reported on 08/11/2020 10/13/19 11/27/20  SAzzie Glatter FNP  sitaGLIPtin (JANUVIA) 25 MG tablet Take 1 tablet (25 mg total) by mouth daily. Patient not taking: Reported on 08/11/2020 02/22/20 11/27/20  WElsie Stain MD    Allergies    Sulfa antibiotics, Bactrim [sulfamethoxazole-trimethoprim], Cephalexin, and Other  Review of Systems   Review of Systems  Constitutional:  Negative for chills and fever.  HENT:  Negative for ear pain and sore throat.   Eyes:  Negative for pain and visual disturbance.  Respiratory:  Positive for cough. Negative  for shortness of breath.   Cardiovascular:  Negative for chest pain and palpitations.  Gastrointestinal:  Positive for abdominal pain and diarrhea. Negative for vomiting.  Genitourinary:  Negative for dysuria and hematuria.  Musculoskeletal:  Negative for arthralgias and back pain.  Skin:  Negative for color change and rash.  Neurological:  Negative for seizures and syncope.  All other systems reviewed and are negative.  Physical Exam Updated Vital Signs BP (!) 189/108   Pulse 94   Temp 99.3 F (37.4 C) (Oral)   Resp 18   SpO2 100%   Physical Exam Vitals and nursing note reviewed.  Constitutional:      General: He is not in acute distress.    Appearance: Normal appearance. He is well-developed. He is not ill-appearing.  HENT:     Head: Normocephalic and atraumatic.     Right Ear: External ear normal.     Left Ear: External ear  normal.     Nose: Nose normal. No congestion.     Mouth/Throat:     Mouth: Mucous membranes are moist.     Pharynx: Oropharynx is clear. No posterior oropharyngeal erythema.  Eyes:     Extraocular Movements: Extraocular movements intact.     Conjunctiva/sclera: Conjunctivae normal.     Pupils: Pupils are equal, round, and reactive to light.  Cardiovascular:     Rate and Rhythm: Normal rate and regular rhythm.     Pulses: Normal pulses.     Heart sounds: No murmur heard. Pulmonary:     Effort: Pulmonary effort is normal. No respiratory distress.     Breath sounds: Normal breath sounds. No wheezing, rhonchi or rales.     Comments: No tachypnea noted.  Breathing comfortable on room air. Abdominal:     General: Abdomen is flat. Bowel sounds are normal. There is distension.     Palpations: Abdomen is soft.     Tenderness: There is abdominal tenderness (Mild generalized). There is no guarding or rebound.  Musculoskeletal:        General: No swelling, tenderness or deformity. Normal range of motion.     Cervical back: Normal range of motion and neck supple. No rigidity.     Right lower leg: Edema (1+ pitting) present.     Left lower leg: Edema (1+ pitting) present.  Skin:    General: Skin is warm and dry.     Capillary Refill: Capillary refill takes less than 2 seconds.     Findings: No rash.  Neurological:     General: No focal deficit present.     Mental Status: He is alert and oriented to person, place, and time.     Sensory: No sensory deficit.     Motor: No weakness.     Coordination: Coordination normal.     Gait: Gait normal.  Psychiatric:        Mood and Affect: Mood normal.    ED Results / Procedures / Treatments   Labs (all labs ordered are listed, but only abnormal results are displayed) Labs Reviewed  COMPREHENSIVE METABOLIC PANEL - Abnormal; Notable for the following components:      Result Value   BUN 36 (*)    Creatinine, Ser 6.54 (*)    Calcium 7.2 (*)     Total Protein 6.4 (*)    Albumin 2.3 (*)    GFR, Estimated 10 (*)    All other components within normal limits  CBC WITH DIFFERENTIAL/PLATELET - Abnormal; Notable for the following components:  Hemoglobin 11.4 (*)    HCT 37.9 (*)    Platelets 454 (*)    All other components within normal limits  I-STAT CHEM 8, ED - Abnormal; Notable for the following components:   BUN 35 (*)    Creatinine, Ser 6.90 (*)    Calcium, Ion 0.88 (*)    Hemoglobin 12.9 (*)    HCT 38.0 (*)    All other components within normal limits  LIPASE, BLOOD    EKG None  Radiology No results found.  Procedures Procedures   Medications Ordered in ED Medications  hydrALAZINE (APRESOLINE) tablet 25 mg (25 mg Oral Given 04/20/21 1802)  carvedilol (COREG) tablet 12.5 mg (12.5 mg Oral Given 04/20/21 1757)    ED Course  I have reviewed the triage vital signs and the nursing notes.  Pertinent labs & imaging results that were available during my care of the patient were reviewed by me and considered in my medical decision making (see chart for details).    MDM Rules/Calculators/A&P                          This is a 50 year old gentleman with a history of ESRD who is scheduled for hemodialysis every Tuesday, Thursday, Saturday.  He has a history of frequent hemodialysis noncompliance.  He returns to the ED today having missed his last 2 sessions of dialysis. Last session was Monday. He complains of some mild abdominal pain, however this is diffuse and nonfocal.  He has no rebound or guarding.  He does admit to recent cough and diarrhea.  I suspect that the abdominal pain may be related to viral illness or distention from edema in the setting of missed dialysis.  He declines viral testing at this time.  He is in no respiratory distress.  He is not requiring oxygen.  His potassium is normal.  His bicarb is normal.  He is tolerating p.o.  No indications for emergent dialysis at this time.  He is scheduled for a dialysis  session tomorrow morning and is appropriate for discharge home for routine dialysis.  Strict return to ED precautions provided.  Final Clinical Impression(s) / ED Diagnoses Final diagnoses:  Generalized abdominal pain  Noncompliance with renal dialysis [facility])    Rx / DC Orders ED Discharge Orders     None        Idamae Lusher, MD 04/20/21 Wagoner, Ankit, MD 04/20/21 2125

## 2021-04-23 ENCOUNTER — Telehealth: Payer: Self-pay

## 2021-04-23 DIAGNOSIS — Z515 Encounter for palliative care: Secondary | ICD-10-CM

## 2021-04-23 NOTE — Telephone Encounter (Signed)
Phone call placed to SW at dialysis center to f/u on message received over the weekend about patient not having orders for dialysis. Per Jaclyn Shaggy, patient still has a place at dialysis on his original schedule. Jaclyn Shaggy also shared that the dialysis center provided a bus pass for patient to have transportation back and forth to his dialysis. Will f/u with patient/family.

## 2021-04-23 NOTE — Telephone Encounter (Signed)
Attempted to contact patient's mom. VM left

## 2021-04-23 NOTE — Telephone Encounter (Signed)
(  2:50 pm)  SW completed a follow-up call to patient's mother-[name]. SW provided support to her regarding care of patient. [name] explained that she has some health issues, but wants to make sure patient is taken care of. [name] explained that patient is not on her lease, but she plans to move the end of December in which patient maybe able to move with her. She advised that patient has a attorney who is helping him get his social security disability. Patient has also applied for section 8 housing. Patient is scheduled to return to dialysis tomorrow on Tuesday. SW scheduled a follow-up visit with patient to include the nurse practitioner 12/14 @ 9 am. SW to provide any additional support and resources to patient/family.

## 2021-04-24 ENCOUNTER — Other Ambulatory Visit: Payer: Self-pay

## 2021-04-25 NOTE — Progress Notes (Signed)
Anderson NAME: [name] DOB: [birth year] MRN: [id number]  PRIMARY CARE PROVIDER: Vevelyn Francois, NP  RESPONSIBLE PARTY:  Acct ID - Guarantor Home Phone Work Phone Relationship Acct Type  [id number] Anderson, [phone]  Self P/F     [address], [place], [place] [address]     VITALS: Today's Vitals   04/20/21 1400  SpO2: (!) 88%       PLAN OF CARE and INTERVENTION:  ADVANCE CARE PLANNING/GOALS OF CARE: Comfort, safety, discussion including whether Anderson wants to continue with dialysis Anderson/CAREGIVER EDUCATION: Education regarding Palliative care services, safety, consequences of skipping dialysis. DISEASE STATUS:  RN Palliative care visit completed today. Arrived at Anderson's mother's home and was informed that Anderson was "hiding in Douglas bathroom". Anderson stayed in bathroom for 45 minutes of Douglas visit. Spoke with Anderson's mother and brother who shared that Anderson has been just laying on Douglas couch, with blankets over his head. He has not been answering when they have attempted to speak with him. He went into Douglas bathroom when he was told of visit. His brother was able to open Douglas door and Anderson was on Douglas floor. He denied falling and stated he sat down because he was sick. He reports that he had vomited earlier. Family members assisted Anderson off Douglas ground and assisted him to walk to Douglas sofa. EMS was called as he continues to refuse dialysis and VS abnormal. Anderson's mother is not comfortable with him remaining in Douglas home and not being compliant to his health care regime. Spoke with Anderson at length. He shared that he wants to have PD set up so that he can go back on Douglas road with [employer] and Douglas circus. Reinforced with Anderson Douglas need to comply with dialysis schedule and Douglas consequences he suffers when continuing to refuse to go. 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 EMS  transported Anderson to [place] ED for further evaluation.   HISTORY OF PRESENT ILLNESS: This is a 50 -year-old male with PMH that including but not limited to ESRD with dialysis scheduled for T/TH/Sat., HTN, and DM. Palliative care has been asked to follow for additional support, goals of care and complex decision making.  CODE STATUS: Full Code, Full Scope of treatment. PPS: 40%    PHYSICAL EXAM: deferred due to Anderson being transported to ED         Maxwell Caul RN, BSN       Cornelius Moras, RN

## 2021-04-30 ENCOUNTER — Other Ambulatory Visit: Payer: Self-pay

## 2021-05-02 ENCOUNTER — Other Ambulatory Visit: Payer: Self-pay

## 2021-05-02 ENCOUNTER — Other Ambulatory Visit: Payer: Self-pay | Admitting: Hospice

## 2021-05-02 NOTE — Progress Notes (Signed)
Joint visit with [place] did not hold because patient would not come to the door or answer his phone. His mother was also called with no success. SW left voicemails for both patient and his mum with call back number.

## 2021-05-11 ENCOUNTER — Other Ambulatory Visit: Payer: Self-pay

## 2021-05-11 ENCOUNTER — Encounter (HOSPITAL_COMMUNITY): Payer: Self-pay

## 2021-05-11 ENCOUNTER — Inpatient Hospital Stay (HOSPITAL_COMMUNITY)
Admission: EM | Admit: 2021-05-11 | Discharge: 2021-05-13 | DRG: 291 | Disposition: A | Discharge status: Home / Self Care | Payer: Medicaid Other | Attending: Family Medicine | Admitting: Family Medicine

## 2021-05-11 ENCOUNTER — Emergency Department (HOSPITAL_COMMUNITY): Payer: Medicaid Other

## 2021-05-11 DIAGNOSIS — Z992 Dependence on renal dialysis: Secondary | ICD-10-CM | POA: Diagnosis not present

## 2021-05-11 DIAGNOSIS — Z20822 Contact with and (suspected) exposure to covid-19: Secondary | ICD-10-CM | POA: Diagnosis present

## 2021-05-11 DIAGNOSIS — Z882 Allergy status to sulfonamides status: Secondary | ICD-10-CM

## 2021-05-11 DIAGNOSIS — E785 Hyperlipidemia, unspecified: Secondary | ICD-10-CM | POA: Diagnosis present

## 2021-05-11 DIAGNOSIS — N186 End stage renal disease: Secondary | ICD-10-CM | POA: Diagnosis present

## 2021-05-11 DIAGNOSIS — I10 Essential (primary) hypertension: Secondary | ICD-10-CM | POA: Diagnosis not present

## 2021-05-11 DIAGNOSIS — I509 Heart failure, unspecified: Secondary | ICD-10-CM | POA: Insufficient documentation

## 2021-05-11 DIAGNOSIS — J9601 Acute respiratory failure with hypoxia: Secondary | ICD-10-CM | POA: Diagnosis present

## 2021-05-11 DIAGNOSIS — N2581 Secondary hyperparathyroidism of renal origin: Secondary | ICD-10-CM | POA: Diagnosis present

## 2021-05-11 DIAGNOSIS — Z888 Allergy status to other drugs, medicaments and biological substances status: Secondary | ICD-10-CM | POA: Diagnosis not present

## 2021-05-11 DIAGNOSIS — E559 Vitamin D deficiency, unspecified: Secondary | ICD-10-CM | POA: Diagnosis present

## 2021-05-11 DIAGNOSIS — G4700 Insomnia, unspecified: Secondary | ICD-10-CM | POA: Diagnosis present

## 2021-05-11 DIAGNOSIS — E1122 Type 2 diabetes mellitus with diabetic chronic kidney disease: Secondary | ICD-10-CM | POA: Diagnosis present

## 2021-05-11 DIAGNOSIS — Z9115 Patient's noncompliance with renal dialysis: Secondary | ICD-10-CM | POA: Diagnosis not present

## 2021-05-11 DIAGNOSIS — I132 Hypertensive heart and chronic kidney disease with heart failure and with stage 5 chronic kidney disease, or end stage renal disease: Principal | ICD-10-CM | POA: Diagnosis present

## 2021-05-11 DIAGNOSIS — E11649 Type 2 diabetes mellitus with hypoglycemia without coma: Secondary | ICD-10-CM | POA: Diagnosis present

## 2021-05-11 DIAGNOSIS — F32A Depression, unspecified: Secondary | ICD-10-CM | POA: Diagnosis present

## 2021-05-11 DIAGNOSIS — I5023 Acute on chronic systolic (congestive) heart failure: Secondary | ICD-10-CM | POA: Diagnosis present

## 2021-05-11 DIAGNOSIS — Z8249 Family history of ischemic heart disease and other diseases of the circulatory system: Secondary | ICD-10-CM | POA: Diagnosis not present

## 2021-05-11 DIAGNOSIS — Z9114 Patient's other noncompliance with medication regimen: Secondary | ICD-10-CM

## 2021-05-11 DIAGNOSIS — Z794 Long term (current) use of insulin: Secondary | ICD-10-CM

## 2021-05-11 DIAGNOSIS — E8770 Fluid overload, unspecified: Secondary | ICD-10-CM | POA: Diagnosis not present

## 2021-05-11 DIAGNOSIS — I5043 Acute on chronic combined systolic (congestive) and diastolic (congestive) heart failure: Secondary | ICD-10-CM | POA: Diagnosis not present

## 2021-05-11 DIAGNOSIS — Z79899 Other long term (current) drug therapy: Secondary | ICD-10-CM | POA: Diagnosis not present

## 2021-05-11 DIAGNOSIS — R197 Diarrhea, unspecified: Secondary | ICD-10-CM | POA: Diagnosis present

## 2021-05-11 DIAGNOSIS — I160 Hypertensive urgency: Secondary | ICD-10-CM | POA: Diagnosis present

## 2021-05-11 DIAGNOSIS — J9811 Atelectasis: Secondary | ICD-10-CM | POA: Diagnosis present

## 2021-05-11 DIAGNOSIS — E872 Acidosis, unspecified: Secondary | ICD-10-CM | POA: Diagnosis present

## 2021-05-11 DIAGNOSIS — E1165 Type 2 diabetes mellitus with hyperglycemia: Secondary | ICD-10-CM | POA: Diagnosis present

## 2021-05-11 DIAGNOSIS — F419 Anxiety disorder, unspecified: Secondary | ICD-10-CM | POA: Diagnosis present

## 2021-05-11 LAB — COMPREHENSIVE METABOLIC PANEL
ALT: 17 U/L (ref 0–44)
AST: 20 U/L (ref 15–41)
Albumin: 2.4 g/dL — ABNORMAL LOW (ref 3.5–5.0)
Alkaline Phosphatase: 79 U/L (ref 38–126)
Anion gap: 11 (ref 5–15)
BUN: 73 mg/dL — ABNORMAL HIGH (ref 6–20)
CO2: 13 mmol/L — ABNORMAL LOW (ref 22–32)
Calcium: 7.4 mg/dL — ABNORMAL LOW (ref 8.9–10.3)
Chloride: 115 mmol/L — ABNORMAL HIGH (ref 98–111)
Creatinine, Ser: 7.27 mg/dL — ABNORMAL HIGH (ref 0.61–1.24)
GFR, Estimated: 9 mL/min — ABNORMAL LOW (ref >60)
Glucose, Bld: 109 mg/dL — ABNORMAL HIGH (ref 70–99)
Potassium: 4.5 mmol/L (ref 3.5–5.1)
Sodium: 139 mmol/L (ref 135–145)
Total Bilirubin: 0.5 mg/dL (ref 0.3–1.2)
Total Protein: 6.1 g/dL — ABNORMAL LOW (ref 6.5–8.1)

## 2021-05-11 LAB — RESP PANEL BY RT-PCR (FLU A&B, COVID) ARPGX2
Influenza A by PCR: NEGATIVE
Influenza B by PCR: NEGATIVE
SARS Coronavirus 2 by RT PCR: NEGATIVE

## 2021-05-11 LAB — CBC WITH DIFFERENTIAL/PLATELET
Abs Immature Granulocytes: 0.03 10*3/uL (ref 0.00–0.07)
Basophils Absolute: 0 10*3/uL (ref 0.0–0.1)
Basophils Relative: 0 %
Eosinophils Absolute: 0.3 10*3/uL (ref 0.0–0.5)
Eosinophils Relative: 3 %
HCT: 38.2 % — ABNORMAL LOW (ref 39.0–52.0)
Hemoglobin: 11.6 g/dL — ABNORMAL LOW (ref 13.0–17.0)
Immature Granulocytes: 0 %
Lymphocytes Relative: 20 %
Lymphs Abs: 1.7 10*3/uL (ref 0.7–4.0)
MCH: 26.5 pg (ref 26.0–34.0)
MCHC: 30.4 g/dL (ref 30.0–36.0)
MCV: 87.4 fL (ref 80.0–100.0)
Monocytes Absolute: 0.9 10*3/uL (ref 0.1–1.0)
Monocytes Relative: 10 %
Neutro Abs: 5.7 10*3/uL (ref 1.7–7.7)
Neutrophils Relative %: 67 %
Platelets: 350 10*3/uL (ref 150–400)
RBC: 4.37 MIL/uL (ref 4.22–5.81)
RDW: 14.9 % (ref 11.5–15.5)
WBC: 8.6 10*3/uL (ref 4.0–10.5)
nRBC: 0 % (ref 0.0–0.2)

## 2021-05-11 LAB — CBG MONITORING, ED: Glucose-Capillary: 214 mg/dL — ABNORMAL HIGH (ref 70–99)

## 2021-05-11 LAB — TROPONIN I (HIGH SENSITIVITY)
Troponin I (High Sensitivity): 89 ng/L — ABNORMAL HIGH (ref <18)
Troponin I (High Sensitivity): 102 ng/L (ref <18)

## 2021-05-11 LAB — MAGNESIUM: Magnesium: 1.8 mg/dL (ref 1.7–2.4)

## 2021-05-11 LAB — BRAIN NATRIURETIC PEPTIDE: B Natriuretic Peptide: 2248.7 pg/mL — ABNORMAL HIGH (ref 0.0–100.0)

## 2021-05-11 MED ORDER — SODIUM BICARBONATE 8.4 % IV SOLN
50.0000 meq | Freq: Once | INTRAVENOUS | Status: AC
Start: 2021-05-11 — End: 2021-05-11
  Administered 2021-05-11: 23:00:00 50 meq via INTRAVENOUS
  Filled 2021-05-11: qty 50

## 2021-05-11 MED ORDER — SERTRALINE HCL 50 MG PO TABS
50.0000 mg | ORAL_TABLET | Freq: Every day | ORAL | Status: DC
  Administered 2021-05-11 – 2021-05-13 (×3): 50 mg via ORAL
  Filled 2021-05-11 (×3): qty 1

## 2021-05-11 MED ORDER — ACETAMINOPHEN 650 MG RE SUPP: 650.0000 mg | Freq: Four times a day (QID) | RECTAL | Status: DC | PRN

## 2021-05-11 MED ORDER — HYDRALAZINE HCL 20 MG/ML IJ SOLN: 5.0000 mg | INTRAMUSCULAR | Status: DC | PRN

## 2021-05-11 MED ORDER — CALCITRIOL 0.25 MCG PO CAPS
0.2500 ug | ORAL_CAPSULE | Freq: Every day | ORAL | Status: DC
  Administered 2021-05-11 – 2021-05-13 (×3): 0.25 ug via ORAL
  Filled 2021-05-11 (×4): qty 1

## 2021-05-11 MED ORDER — STERILE WATER FOR INJECTION IV SOLN
INTRAVENOUS | Status: DC
  Filled 2021-05-11: qty 1000

## 2021-05-11 MED ORDER — FUROSEMIDE 10 MG/ML IJ SOLN
120.0000 mg | Freq: Once | INTRAVENOUS | Status: AC
  Administered 2021-05-11: 22:00:00 120 mg via INTRAVENOUS
  Filled 2021-05-11: qty 10

## 2021-05-11 MED ORDER — INSULIN ASPART 100 UNIT/ML IJ SOLN
0.0000 [IU] | Freq: Every day | INTRAMUSCULAR | Status: DC
  Administered 2021-05-11: 23:00:00 2 [IU] via SUBCUTANEOUS

## 2021-05-11 MED ORDER — CARVEDILOL 12.5 MG PO TABS
12.5000 mg | ORAL_TABLET | Freq: Two times a day (BID) | ORAL | Status: DC
  Administered 2021-05-12 – 2021-05-13 (×2): 12.5 mg via ORAL
  Filled 2021-05-11 (×2): qty 1

## 2021-05-11 MED ORDER — HEPARIN SODIUM (PORCINE) 5000 UNIT/ML IJ SOLN
5000.0000 [IU] | Freq: Three times a day (TID) | INTRAMUSCULAR | Status: DC
  Administered 2021-05-11 – 2021-05-13 (×4): 5000 [IU] via SUBCUTANEOUS
  Filled 2021-05-11 (×4): qty 1

## 2021-05-11 MED ORDER — ATORVASTATIN CALCIUM 40 MG PO TABS
40.0000 mg | ORAL_TABLET | Freq: Every day | ORAL | Status: DC
  Administered 2021-05-11 – 2021-05-13 (×3): 40 mg via ORAL
  Filled 2021-05-11 (×3): qty 1

## 2021-05-11 MED ORDER — AMLODIPINE BESYLATE 5 MG PO TABS
10.0000 mg | ORAL_TABLET | Freq: Once | ORAL | Status: AC
  Administered 2021-05-11: 19:00:00 10 mg via ORAL
  Filled 2021-05-11: qty 2

## 2021-05-11 MED ORDER — ACETAMINOPHEN 325 MG PO TABS
650.0000 mg | ORAL_TABLET | Freq: Four times a day (QID) | ORAL | Status: DC | PRN
  Administered 2021-05-13: 05:00:00 650 mg via ORAL
  Filled 2021-05-11: qty 2

## 2021-05-11 MED ORDER — AMLODIPINE BESYLATE 10 MG PO TABS
10.0000 mg | ORAL_TABLET | Freq: Every day | ORAL | Status: DC
  Administered 2021-05-13: 09:00:00 10 mg via ORAL
  Filled 2021-05-11: qty 1

## 2021-05-11 MED ORDER — INSULIN ASPART 100 UNIT/ML IJ SOLN: 0.0000 [IU] | Freq: Three times a day (TID) | INTRAMUSCULAR | Status: DC

## 2021-05-11 MED ORDER — LABETALOL HCL 5 MG/ML IV SOLN
20.0000 mg | Freq: Once | INTRAVENOUS | Status: AC
  Administered 2021-05-11: 21:00:00 20 mg via INTRAVENOUS
  Filled 2021-05-11: qty 4

## 2021-05-11 MED ORDER — INSULIN GLARGINE 100 UNIT/ML SOLOSTAR PEN: 25.0000 [IU] | PEN_INJECTOR | Freq: Every day | SUBCUTANEOUS | Status: DC

## 2021-05-11 MED ORDER — INSULIN GLARGINE-YFGN 100 UNIT/ML ~~LOC~~ SOLN
25.0000 [IU] | Freq: Every day | SUBCUTANEOUS | Status: DC
  Administered 2021-05-11: 23:00:00 25 [IU] via SUBCUTANEOUS
  Filled 2021-05-11 (×2): qty 0.25

## 2021-05-11 MED ORDER — HYDRALAZINE HCL 25 MG PO TABS
25.0000 mg | ORAL_TABLET | Freq: Three times a day (TID) | ORAL | Status: DC
  Administered 2021-05-11 – 2021-05-13 (×4): 25 mg via ORAL
  Filled 2021-05-11 (×4): qty 1

## 2021-05-11 NOTE — H&P (Signed)
History and Physical    [name] ZOX:[id number] DOB: 09-Dec-1970 DOA: 05/11/2021  PCP: Vevelyn Francois, NP Patient coming from: Home  Chief Complaint: Missed dialysis, high blood pressure, chest pain  HPI: [name] is a 50 y.o. male with medical history significant of ESRD on HD TTS, chronic combined CHF (EF 30 to 35% and G1DD on echo done in July 2022), hypertension, hyperlipidemia, insulin-dependent type 2 diabetes, depression, anxiety, history of medical noncompliance presented to the ED for evaluation of high blood pressure in the setting of not taking his home medications and not going for dialysis for the past 3 weeks.  Also complained of intermittent chest pain for the last week.  In the ED, blood pressure elevated on arrival with systolic in the 811B and diastolic above 147.  Oxygen saturation in the upper 80s on room air, placed on 2 L supplemental oxygen.  Labs showing no leukocytosis.  Hemoglobin 11.6, stable.  Bicarb 13.  BNP 2248.  COVID and influenza PCR negative.  High-sensitivity troponin 89, no significant change from baseline.  EKG without acute ischemic changes.  Chest x-ray showing moderate cardiomegaly with mild interstitial edema and mild bibasilar atelectasis. Nephrology consulted and felt that he did not need urgent dialysis overnight.  Recommended giving IV Lasix 120 mg (patient told ED PA he makes urine) and plan for routine dialysis tomorrow.  Patient was given IV Lasix 120 mg, amlodipine 10 mg, and IV labetalol 20 mg.  History limited as patient did not seem interested in talking to me.  States he has not gone for dialysis since he left the hospital 3 weeks ago.  States he does not go for dialysis as it makes him have diarrhea afterwards and also because he did not have transportation.  Reports chronic shortness of breath, no change.  Reports having chest pain "for a while."  He describes it as pain all across his chest.  Reports cough.  Denies fevers.  States he  had nausea and vomiting last week but is no longer vomiting.  He is not having any diarrhea at present.  He is not taking his home medications regularly and has missed doses for several days.  Review of Systems:  All systems reviewed and apart from history of presenting illness, are negative.  Past Medical History:  Diagnosis Date   Anxiety 02/2019   Anxiety 10/2019   Diabetes mellitus type II, uncontrolled 07/17/2006   Elevated alkaline phosphatase level 01/2019   ESSENTIAL HYPERTENSION 07/17/2006   HYPERLIPIDEMIA 11/24/2008   Insomnia 02/2019   Left bundle branch block 02/22/2019   Lesion of lip 10/2019   Pneumonia 10/22/2011   Suicidal ideations 02/2019   Vitamin D deficiency 01/2019    Past Surgical History:  Procedure Laterality Date   ARM SURGERY     AV FISTULA PLACEMENT Right 02/20/2021   Procedure: RIGHT ARTERIOVENOUS GRAFT CREATION;  Surgeon: Angelia Mould, MD;  Location: [facility];  Service: Vascular;  Laterality: Right;   INSERTION OF DIALYSIS CATHETER Left 02/20/2021   Procedure: INSERTION OF DIALYSIS CATHETER USING PALINDROME CATHETER;  Surgeon: Angelia Mould, MD;  Location: [facility];  Service: Vascular;  Laterality: Left;   ULTRASOUND GUIDANCE FOR VASCULAR ACCESS  02/20/2021   Procedure: ULTRASOUND GUIDANCE FOR VASCULAR ACCESS;  Surgeon: Angelia Mould, MD;  Location: [facility];  Service: Vascular;;     reports that he has never smoked. He has never used smokeless tobacco. He reports that he does not drink alcohol and does  not use drugs.  Allergies  Allergen Reactions   Sulfa Antibiotics Rash    Severe rash. Do not give sulfa medications   Bactrim [Sulfamethoxazole-Trimethoprim]     Rash   Cephalexin     Patient with severe drug reaction including exfoliating skin rash and hypotension after being prescribed both cephalexin and sulfamethoxazole-trimethoprim simultaneously. Favor SMX as much more likely culprit as patient had tolerated other cephalosporins  in the past, but cannot say with complete certainty that this was not related to cephalexin.    Other     Patient states he is allergic to the TB test    Family History  Problem Relation Age of Onset   Hypertension Mother 75   Cancer Mother        bladder cancer   Alcohol abuse Father    Hypertension Brother     Prior to Admission medications   Medication Sig Start Date End Date Taking? Authorizing Provider  amLODipine (NORVASC) 10 MG tablet Take 1 tablet by mouth once a day 03/22/21  Yes   atorvastatin (LIPITOR) 40 MG tablet Take 1 tablet (40 mg total) by mouth daily. 02/26/21  Yes [name], [name], MD  calcitRIOL (ROCALTROL) 0.25 MCG capsule Take 1 capsule (0.25 mcg total) by mouth daily. 02/26/21  Yes [name], [name], MD  carvedilol (COREG) 12.5 MG tablet Take 1 tablet (12.50m total) by mouth two times a day with a meal. 03/22/21  Yes   furosemide (LASIX) 40 MG tablet Take 1 tablet (40 mg total) by mouth daily. 04/17/21 05/17/21 Yes PDarliss Cheney MD  hydrALAZINE (APRESOLINE) 25 MG tablet Take 1 tablet by mouth three times a day 03/22/21  Yes   insulin aspart (NOVOLOG FLEXPEN) 100 UNIT/ML FlexPen Inject 5 Units into the skin 2 (two) times daily with a meal. 02/26/21  Yes [name], DSilver Huguenin MD  insulin glargine (LANTUS) 100 UNIT/ML Solostar Pen Inject 25 Units into the skin daily. May substitute with any other long-acting formulary. 02/26/21  Yes [name], DSilver Huguenin MD  sertraline (ZOLOFT) 50 MG tablet Take 1 tablet (50 mg total) by mouth daily. 04/17/21 05/17/21 Yes Pahwani, REinar Grad MD  Blood Glucose Monitoring Suppl (ACCU-CHEK GUIDE) w/Device KIT Use as directed 11/29/20   KAnnita Brod MD  Blood Glucose Monitoring Suppl (TRUE METRIX METER) w/Device KIT Use to measure blood sugar twice a day 02/22/20   WElsie Stain MD  glucose blood test strip Use to check fasting blood sugar once daily. 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 Insulin dependent 11/29/20   KAnnita Brod MD  Insulin Pen  Needle 32G X 4 MM MISC Use with insulin pens 02/26/21   [name], DSilver Huguenin MD  Methoxy PEG-Epoetin Beta (MIRCERA IJ) Mircera 03/08/21 03/07/22  [provider]  TRUEplus Lancets 28G MISC USE TO MEASURE BLOOD SUGAR TWICE A DAY 11/29/20 11/29/21  KAnnita Brod MD  gabapentin (NEURONTIN) 300 MG capsule Take 1 capsule (300 mg total) by mouth 3 (three) times daily. Patient not taking: Reported on 07/27/2019 07/29/18 08/30/19  SAzzie Glatter FNP  insulin detemir (LEVEMIR FLEXTOUCH) 100 UNIT/ML FlexPen Inject 35 Units into the skin daily. Patient not taking: Reported on 02/17/2021 11/29/20 02/24/21  KAnnita Brod MD  sildenafil (VIAGRA) 25 MG tablet Take 1 tablet (25 mg total) by mouth as needed for erectile dysfunction. for erectile dysfunction. Patient not taking: Reported on 08/11/2020 10/13/19 11/27/20  SAzzie Glatter FNP  sitaGLIPtin (JANUVIA) 25 MG tablet Take 1 tablet (25 mg total) by mouth daily. Patient  not taking: Reported on 08/11/2020 02/22/20 11/27/20  [name], MD    Physical Exam: Vitals:   05/11/21 1908 05/11/21 1930 05/11/21 2026 05/11/21 2045  BP: (!) 188/100 (!) 190/93 (!) 174/81 (!) 178/88  Pulse:  92 92 92  Resp:  (!) 21 17 (!) 26  Temp:      TempSrc:      SpO2:  94% 90% 95%  Weight:      Height:        Physical Exam Constitutional:      General: He is not in acute distress. HENT:     Head: Normocephalic and atraumatic.  Eyes:     Comments: Unable to examine as patient had his head down and was avoiding eye contact  Cardiovascular:     Rate and Rhythm: Normal rate and regular rhythm.     Pulses: Normal pulses.  Pulmonary:     Effort: Pulmonary effort is normal. No respiratory distress.     Breath sounds: Rales present. No wheezing.  Abdominal:     General: Bowel sounds are normal. There is no distension.     Palpations: Abdomen is soft.     Tenderness: There is no abdominal tenderness.  Musculoskeletal:        General: No swelling or  tenderness.     Cervical back: Normal range of motion and neck supple.  Skin:    General: Skin is warm and dry.  Neurological:     General: No focal deficit present.     Mental Status: He is alert and oriented to person, place, and time.     Labs on Admission: I have personally reviewed following labs and imaging studies  CBC: Recent Labs  Lab 05/11/21 1856  WBC 8.6  NEUTROABS 5.7  HGB 11.6*  HCT 38.2*  MCV 87.4  PLT 001   Basic Metabolic Panel: Recent Labs  Lab 05/11/21 1856  NA 139  K 4.5  CL 115*  CO2 13*  GLUCOSE 109*  BUN 73*  CREATININE 7.27*  CALCIUM 7.4*  MG 1.8   GFR: Estimated Creatinine Clearance: 11.6 mL/min (A) (by C-G formula based on SCr of 7.27 mg/dL (H)). Liver Function Tests: Recent Labs  Lab 05/11/21 1856  AST 20  ALT 17  ALKPHOS 79  BILITOT 0.5  PROT 6.1*  ALBUMIN 2.4*   No results for input(s): LIPASE, AMYLASE in the last 168 hours. No results for input(s): AMMONIA in the last 168 hours. Coagulation Profile: No results for input(s): INR, PROTIME in the last 168 hours. Cardiac Enzymes: No results for input(s): CKTOTAL, CKMB, CKMBINDEX, TROPONINI in the last 168 hours. BNP (last 3 results) No results for input(s): PROBNP in the last 8760 hours. HbA1C: No results for input(s): HGBA1C in the last 72 hours. CBG: No results for input(s): GLUCAP in the last 168 hours. Lipid Profile: No results for input(s): CHOL, HDL, LDLCALC, TRIG, CHOLHDL, LDLDIRECT in the last 72 hours. Thyroid Function Tests: No results for input(s): TSH, T4TOTAL, FREET4, T3FREE, THYROIDAB in the last 72 hours. Anemia Panel: No results for input(s): VITAMINB12, FOLATE, FERRITIN, TIBC, IRON, RETICCTPCT in the last 72 hours. Urine analysis:    Component Value Date/Time   COLORURINE YELLOW 02/19/2021 0045   APPEARANCEUR HAZY (A) 02/19/2021 0045   APPEARANCEUR Clear 05/09/2020 1509   LABSPEC 1.013 02/19/2021 0045   PHURINE 5.0 02/19/2021 0045   GLUCOSEU >=500  (A) 02/19/2021 0045   HGBUR MODERATE (A) 02/19/2021 0045   BILIRUBINUR NEGATIVE 02/19/2021 0045  BILIRUBINUR Negative 05/09/2020 1509   KETONESUR NEGATIVE 02/19/2021 0045   PROTEINUR >=300 (A) 02/19/2021 0045   UROBILINOGEN 0.2 02/04/2020 1612   UROBILINOGEN 0.2 02/16/2014 0045   NITRITE NEGATIVE 02/19/2021 0045   LEUKOCYTESUR NEGATIVE 02/19/2021 0045    Radiological Exams on Admission: DG Chest 2 View  Result Date: 05/11/2021 CLINICAL DATA:  Shortness of breath. Hypertension. Lack of dialysis for 2-3 weeks. EXAM: CHEST - 2 VIEW COMPARISON:  04/11/2021 FINDINGS: Dialysis catheter from a left IJ approach tip at low SVC. Midline trachea. Moderate cardiomegaly. No pleural effusion or pneumothorax. Mild interstitial edema is similar. Mild bibasilar atelectasis. IMPRESSION: Cardiomegaly with mild congestive heart failure and bibasilar atelectasis. Electronically Signed   By: Abigail Miyamoto M.D.   On: 05/11/2021 19:50    EKG: Independently reviewed.  Sinus rhythm, LBBB, QTC 522.  No significant change compared to prior tracings.  Assessment/Plan Principal Problem:   Volume overload Active Problems:   Hyperlipidemia LDL goal <70   Essential hypertension   Hypertensive urgency   CHF exacerbation (HCC)   Acute hypoxic respiratory failure secondary to volume overload in the setting of missed hemodialysis/ medical noncompliance and acute on chronic combined CHF Missed dialysis x3 weeks.  Echo done in July 2022 showing EF 30 to 35% and grade 1 diastolic dysfunction.  BNP significantly elevated at 2248. Chest x-ray showing mild interstitial edema.  Hypoxic to the upper 80s on room air, currently satting well on 2 L supplemental oxygen. -Patient still makes urine and was given IV Lasix 120 mg per nephrology recommendation.  Nephrology planning on routine dialysis in the morning.  Metabolic acidosis secondary to missed hemodialysis -Start bicarb supplementation  ESRD on HD TTS Patient here with  volume overload and acidosis in the setting of missing dialysis for the past 3 weeks. -Nephrology consulted and does not feel he needs urgent dialysis overnight.  Planning on routine dialysis in the morning.  Lasix and bicarb supplementation given.  Chest pain Appears atypical, ongoing "for a while" per patient.  High-sensitivity troponin slightly elevated but at baseline.  EKG without acute ischemic changes. -Cardiac monitoring, check second set of troponin  Hypertensive urgency Due to volume overload in the setting of medication and HD noncompliance.  Patient was given antihypertensives in the ED, blood pressure now slightly improved. -Resume home amlodipine, Coreg, and hydralazine.  IV hydralazine prn SBP >180.   Hyperlipidemia -Continue Lipitor  Poorly controlled insulin-dependent type 2 diabetes A1c 8.8 on 02/18/2021, suspect due to medication noncompliance. -Continue Lantus 25 units daily.  Very sensitive sliding scale insulin ACHS.  Depression, anxiety -Continue Zoloft  QT prolongation Seen on prior tracings as well and no significant change.  Potassium and magnesium levels normal. -Cardiac monitoring, monitor electrolytes  DVT prophylaxis: Subcutaneous heparin Code Status: Patient wishes to be full code. Family Communication: Diagnostic findings and treatment plan discussed with the patient.  No family at bedside. Disposition Plan: Status is: Inpatient  Remains inpatient appropriate because: Needs serial hemodialysis for volume overload in the setting of missing dialysis for the past 3 weeks.  Level of care: Level of care: Telemetry Medical  The medical decision making on this patient was of high complexity and the patient is at high risk for clinical deterioration, therefore this is a level 3 visit.  Shela Leff MD [facility]  If 7PM-7AM, please contact night-coverage [email or web address]  05/11/2021, 9:14 PM

## 2021-05-11 NOTE — ED Notes (Signed)
Pt placed on 2 L [place] due to O2 maintaining at 88%.

## 2021-05-11 NOTE — ED Notes (Signed)
Marlowe Sax, MD paged about increasing troponin.

## 2021-05-11 NOTE — Progress Notes (Signed)
Informed of patient in ER. Discussed with ER provider. I believe he has not gone to dialysis since discharge in Nov, has a known pattern of medical noncompliance. Labs acceptable with the exception of acidosis. CHF on CXR and hypertensive (has not taken meds per charting), can give lasix 120mg  IV provided he is making urine. Plan for routine dialysis tomorrow, no urgent indication at the moment. Full consult tomorrow. Dialyzes at [facility], on TTS schedule.  Gean Quint, MD [facility]

## 2021-05-11 NOTE — ED Triage Notes (Signed)
BIB [facility] from home. Pt has had hypertension past few days. Hasnt been taking medication because "it increases his BP". Pt has not had dialysis for x2-3 weeks due to "not feeling well somedays." Pt had Chest pain yesterday but none today.   208/116 94 heart rate 96% room air

## 2021-05-11 NOTE — ED Provider Notes (Signed)
[facility] EMERGENCY DEPARTMENT Provider Note   CSN: [id number] Arrival date & time: 05/11/21  1811     History Chief Complaint  Patient presents with   Hypertension    [name] is a 50 y.o. male with history of chronic kidney disease on dialysis Tuesday Thursday Saturday, hypertension, diabetes who presents the emergency department with elevated blood pressures and intermittent chest pain that have been ongoing for the last week.  Patient states that he has missed approximately 3 weeks of dialysis as he was feeling "unwell."  Patient also states that he has not taken his antihypertensives over the last couple days.  He endorses associated shortness of breath, abdominal tightness after eating, bilateral lower extremity edema, and chills.  Also states he had some nausea and vomiting last week.  He denies any diarrhea, sore throat, congestion.  Shortness of breath is worse with exertion.   Hypertension      Past Medical History:  Diagnosis Date   Anxiety 02/2019   Anxiety 10/2019   Diabetes mellitus type II, uncontrolled 07/17/2006   Elevated alkaline phosphatase level 01/2019   ESSENTIAL HYPERTENSION 07/17/2006   HYPERLIPIDEMIA 11/24/2008   Insomnia 02/2019   Left bundle branch block 02/22/2019   Lesion of lip 10/2019   Pneumonia 10/22/2011   Suicidal ideations 02/2019   Vitamin D deficiency 01/2019    Patient Active Problem List   Diagnosis Date Noted   Volume overload 04/12/2021   Atypical chest pain 04/12/2021   Mild protein-calorie malnutrition ([place]) 03/06/2021   Aftercare including intermittent dialysis ([place]) 02/27/2021   Allergy, unspecified, initial encounter 02/27/2021   Anaphylactic shock, unspecified, initial encounter 02/27/2021   Anemia in chronic kidney disease 02/27/2021   Diarrhea, unspecified 02/27/2021   End stage renal disease ([place]) 02/27/2021   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   Pruritus, unspecified  02/27/2021   Secondary hyperparathyroidism of renal origin ([place]) 02/27/2021   Shortness of breath 02/27/2021   Fever, unspecified 02/27/2021   Type 2 diabetes mellitus with other diabetic kidney complication ([place]) 00/17/4944   Hypertensive urgency 02/18/2021   Type 2 diabetes mellitus ([place]) 02/17/2021   Acute combined systolic and diastolic congestive heart failure ([place]) 11/29/2020   Hypertensive crisis 11/27/2020   Lower extremity edema 03/07/2020   Stage 4 chronic kidney disease ([place]) 02/22/2020   Left ventricular dysfunction 04/27/2019   Hemoglobin A1C greater than 9%, indicating poor diabetic control 07/29/2018   New onset left bundle branch block (LBBB)    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   Hyperlipidemia LDL goal <70 11/24/2008   Uncontrolled type 2 diabetes mellitus with peripheral neuropathy 07/17/2006   Essential hypertension 07/17/2006   DM2 (diabetes mellitus, type 2) ([place]) 07/17/2006    Past Surgical History:  Procedure Laterality Date   ARM SURGERY     AV FISTULA PLACEMENT Right 02/20/2021   Procedure: RIGHT ARTERIOVENOUS GRAFT CREATION;  Surgeon: Angelia Mould, MD;  Location: [facility];  Service: Vascular;  Laterality: Right;   INSERTION OF DIALYSIS CATHETER Left 02/20/2021   Procedure: INSERTION OF DIALYSIS CATHETER USING PALINDROME CATHETER;  Surgeon: Angelia Mould, MD;  Location: [facility] OR;  Service: Vascular;  Laterality: Left;   ULTRASOUND GUIDANCE FOR VASCULAR ACCESS  02/20/2021   Procedure: ULTRASOUND GUIDANCE FOR VASCULAR ACCESS;  Surgeon: Angelia Mould, MD;  Location: [facility] OR;  Service: Vascular;;       Family History  Problem Relation Age of Onset   Hypertension Mother 51   Cancer Mother  bladder cancer   Alcohol abuse Father    Hypertension Brother     Social History   Tobacco Use   Smoking status: Never   Smokeless tobacco: Never  Vaping Use   Vaping Use: Never used  Substance Use Topics   Alcohol use:  No    Alcohol/week: 0.0 standard drinks   Drug use: No    Home Medications Prior to Admission medications   Medication Sig Start Date End Date Taking? Authorizing Provider  amLODipine (NORVASC) 10 MG tablet Take 1 tablet by mouth once a day 03/22/21  Yes   atorvastatin (LIPITOR) 40 MG tablet Take 1 tablet (40 mg total) by mouth daily. 02/26/21  Yes [name], [name], MD  calcitRIOL (ROCALTROL) 0.25 MCG capsule Take 1 capsule (0.25 mcg total) by mouth daily. 02/26/21  Yes [name], [name], MD  carvedilol (COREG) 12.5 MG tablet Take 1 tablet (12.84m total) by mouth two times a day with a meal. 03/22/21  Yes   furosemide (LASIX) 40 MG tablet Take 1 tablet (40 mg total) by mouth daily. 04/17/21 05/17/21 Yes PDarliss Cheney MD  hydrALAZINE (APRESOLINE) 25 MG tablet Take 1 tablet by mouth three times a day 03/22/21  Yes   insulin aspart (NOVOLOG FLEXPEN) 100 UNIT/ML FlexPen Inject 5 Units into the skin 2 (two) times daily with a meal. 02/26/21  Yes [name], DSilver Huguenin MD  insulin glargine (LANTUS) 100 UNIT/ML Solostar Pen Inject 25 Units into the skin daily. May substitute with any other long-acting formulary. 02/26/21  Yes [name], DSilver Huguenin MD  sertraline (ZOLOFT) 50 MG tablet Take 1 tablet (50 mg total) by mouth daily. 04/17/21 05/17/21 Yes Pahwani, REinar Grad MD  Blood Glucose Monitoring Suppl (ACCU-CHEK GUIDE) w/Device KIT Use as directed 11/29/20   KAnnita Brod MD  Blood Glucose Monitoring Suppl (TRUE METRIX METER) w/Device KIT Use to measure blood sugar twice a day 02/22/20   WElsie Stain MD  glucose blood test strip Use to check fasting blood sugar once daily. 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 Insulin dependent 11/29/20   KAnnita Brod MD  Insulin Pen Needle 32G X 4 MM MISC Use with insulin pens 02/26/21   [name], DSilver Huguenin MD  Methoxy PEG-Epoetin Beta (MIRCERA IJ) Mircera 03/08/21 03/07/22  [provider]  TRUEplus Lancets 28G MISC USE TO MEASURE BLOOD SUGAR TWICE A DAY 11/29/20  11/29/21  KAnnita Brod MD  gabapentin (NEURONTIN) 300 MG capsule Take 1 capsule (300 mg total) by mouth 3 (three) times daily. Patient not taking: Reported on 07/27/2019 07/29/18 08/30/19  SAzzie Glatter FNP  insulin detemir (LEVEMIR FLEXTOUCH) 100 UNIT/ML FlexPen Inject 35 Units into the skin daily. Patient not taking: Reported on 02/17/2021 11/29/20 02/24/21  KAnnita Brod MD  sildenafil (VIAGRA) 25 MG tablet Take 1 tablet (25 mg total) by mouth as needed for erectile dysfunction. for erectile dysfunction. Patient not taking: Reported on 08/11/2020 10/13/19 11/27/20  SAzzie Glatter FNP  sitaGLIPtin (JANUVIA) 25 MG tablet Take 1 tablet (25 mg total) by mouth daily. Patient not taking: Reported on 08/11/2020 02/22/20 11/27/20  WElsie Stain MD    Allergies    Sulfa antibiotics, Bactrim [sulfamethoxazole-trimethoprim], Cephalexin, and Other  Review of Systems   Review of Systems  All other systems reviewed and are negative.  Physical Exam Updated Vital Signs BP (!) 190/93    Pulse 92    Temp 98.5 F (36.9 C) (Oral)    Resp (!) 21    Ht _0  (1.702 m)  Wt 68 kg    SpO2 94%    BMI 23.49 kg/m   Physical Exam Vitals and nursing note reviewed.  Constitutional:      General: He is not in acute distress.    Appearance: Normal appearance.  HENT:     Head: Normocephalic and atraumatic.  Eyes:     General:        Right eye: No discharge.        Left eye: No discharge.  Cardiovascular:     Comments: Regular rate and rhythm.  S1/S2 are distinct without any evidence of murmur, rubs, or gallops.  Radial pulses are 2+ bilaterally.  Dorsalis pedis pulses are 2+ bilaterally.   Pulmonary:     Comments: Normal effort.  No respiratory distress.  There is mild expiratory wheeze heard diffusely. Abdominal:     General: Abdomen is flat. Bowel sounds are normal. There is no distension.     Tenderness: There is no abdominal tenderness. There is no guarding or rebound.  Musculoskeletal:         General: Normal range of motion.     Cervical back: Neck supple.     Right lower leg: 3+ Edema present.     Left lower leg: 3+ Edema present.  Skin:    General: Skin is warm and dry.     Findings: No rash.  Neurological:     General: No focal deficit present.     Mental Status: He is alert.  Psychiatric:        Mood and Affect: Mood normal.        Behavior: Behavior normal.    ED Results / Procedures / Treatments   Labs (all labs ordered are listed, but only abnormal results are displayed) Labs Reviewed  BRAIN NATRIURETIC PEPTIDE - Abnormal; Notable for the following components:      Result Value   B Natriuretic Peptide 2,248.7 (*)    All other components within normal limits  COMPREHENSIVE METABOLIC PANEL - Abnormal; Notable for the following components:   Chloride 115 (*)    CO2 13 (*)    Glucose, Bld 109 (*)    BUN 73 (*)    Creatinine, Ser 7.27 (*)    Calcium 7.4 (*)    Total Protein 6.1 (*)    Albumin 2.4 (*)    GFR, Estimated 9 (*)    All other components within normal limits  CBC WITH DIFFERENTIAL/PLATELET - Abnormal; Notable for the following components:   Hemoglobin 11.6 (*)    HCT 38.2 (*)    All other components within normal limits  TROPONIN I (HIGH SENSITIVITY) - Abnormal; Notable for the following components:   Troponin I (High Sensitivity) 89 (*)    All other components within normal limits  RESP PANEL BY RT-PCR (FLU A&B, COVID) ARPGX2  MAGNESIUM    EKG EKG Interpretation  Date/Time:  Friday May 11 2021 18:12:22 EST Ventricular Rate:  94 PR Interval:  150 QRS Duration: 152 QT Interval:  417 QTC Calculation: 522 R Axis:   -22 Text Interpretation: Sinus rhythm Left bundle branch block Confirmed by Thamas Jaegers ([id number]) on 05/11/2021 7:42:27 PM  Radiology DG Chest 2 View  Result Date: 05/11/2021 CLINICAL DATA:  Shortness of breath. Hypertension. Lack of dialysis for 2-3 weeks. EXAM: CHEST - 2 VIEW COMPARISON:  04/11/2021 FINDINGS:  Dialysis catheter from a left IJ approach tip at low SVC. Midline trachea. Moderate cardiomegaly. No pleural effusion or pneumothorax. Mild interstitial edema is similar. Mild bibasilar  atelectasis. IMPRESSION: Cardiomegaly with mild congestive heart failure and bibasilar atelectasis. Electronically Signed   By: Abigail Miyamoto M.D.   On: 05/11/2021 19:50    Procedures Procedures   Medications Ordered in ED Medications  labetalol (NORMODYNE) injection 20 mg (has no administration in time range)  furosemide (LASIX) 120 mg in dextrose 5 % 50 mL IVPB (has no administration in time range)  amLODipine (NORVASC) tablet 10 mg (10 mg Oral Given 05/11/21 1908)    ED Course  I have reviewed the triage vital signs and the nursing notes.  Pertinent labs & imaging results that were available during my care of the patient were reviewed by me and considered in my medical decision making (see chart for details).  Clinical Course as of 05/11/21 2038  Fri May 11, 2021  1944 I spoke with nephrology who states that he is unable to dialyze him tonight.  He recommends placing the patient and obvious and he will dialyze him in the morning. [CF]  2035 Spoke with Dr. Wandra Feinstein, the hospitalist, who will come evaluate the patient at bedside and agrees admit for observation and dialysis in the morning. [CF]    Clinical Course User Index [CF] Cherrie Gauze   MDM Rules/Calculators/A&P                          CHICO CAWOOD is a 50 y.o. male who presents the emergency department for further evaluation of elevated blood pressures.  Patient is hypertensive but has otherwise normal vital signs.  He is answering all my questions appropriately and in no acute distress at this time.  We will give him 10 mg of amlodipine and see how he responds.  I will order labs and chest x-ray to evaluate for CHF, ACS, infectious etiologies, electrolyte derangements in the setting of chronic kidney failure.  CBC was without any  evidence of leukocytosis.  Initial troponin was 89 which seems to be chronic for the patient CMP had some electrolyte abnormalities and elevated creatinine in the setting of end-stage renal disease.  Potassium was normal.  Respiratory panel was negative.  BNP was 2200.  He is significantly volume overloaded. Per nephrology request I will start 120 of Lasix IV.  Given that he has missed 3 weeks of dialysis per nephrology recommendation I will admit the patient for observation and dialysis in the morning.   Final Clinical Impression(s) / ED Diagnoses Final diagnoses:  End stage renal disease [facility])    Rx / [facility] Orders ED Discharge Orders     None        Cherrie Gauze 05/11/21 2040    Luna Fuse, MD 05/11/21 2050

## 2021-05-12 LAB — RENAL FUNCTION PANEL
Albumin: 2 g/dL — ABNORMAL LOW (ref 3.5–5.0)
Albumin: 2.3 g/dL — ABNORMAL LOW (ref 3.5–5.0)
Anion gap: 8 (ref 5–15)
Anion gap: 10 (ref 5–15)
BUN: 73 mg/dL — ABNORMAL HIGH (ref 6–20)
BUN: 73 mg/dL — ABNORMAL HIGH (ref 6–20)
CO2: 17 mmol/L — ABNORMAL LOW (ref 22–32)
CO2: 16 mmol/L — ABNORMAL LOW (ref 22–32)
Calcium: 7 mg/dL — ABNORMAL LOW (ref 8.9–10.3)
Calcium: 7.3 mg/dL — ABNORMAL LOW (ref 8.9–10.3)
Chloride: 113 mmol/L — ABNORMAL HIGH (ref 98–111)
Chloride: 113 mmol/L — ABNORMAL HIGH (ref 98–111)
Creatinine, Ser: 7.22 mg/dL — ABNORMAL HIGH (ref 0.61–1.24)
Creatinine, Ser: 7.2 mg/dL — ABNORMAL HIGH (ref 0.61–1.24)
GFR, Estimated: 9 mL/min — ABNORMAL LOW (ref >60)
GFR, Estimated: 9 mL/min — ABNORMAL LOW (ref >60)
Glucose, Bld: 92 mg/dL (ref 70–99)
Glucose, Bld: 100 mg/dL — ABNORMAL HIGH (ref 70–99)
Phosphorus: 6.6 mg/dL — ABNORMAL HIGH (ref 2.5–4.6)
Phosphorus: 6.6 mg/dL — ABNORMAL HIGH (ref 2.5–4.6)
Potassium: 4.1 mmol/L (ref 3.5–5.1)
Potassium: 4.2 mmol/L (ref 3.5–5.1)
Sodium: 138 mmol/L (ref 135–145)
Sodium: 139 mmol/L (ref 135–145)

## 2021-05-12 LAB — MRSA NEXT GEN BY PCR, NASAL: MRSA by PCR Next Gen: NOT DETECTED

## 2021-05-12 LAB — GLUCOSE, CAPILLARY
Glucose-Capillary: 87 mg/dL (ref 70–99)
Glucose-Capillary: 104 mg/dL — ABNORMAL HIGH (ref 70–99)
Glucose-Capillary: 128 mg/dL — ABNORMAL HIGH (ref 70–99)

## 2021-05-12 LAB — HEPATITIS B SURFACE ANTIBODY,QUALITATIVE: Hep B S Ab: NONREACTIVE

## 2021-05-12 LAB — CBC
HCT: 33.1 % — ABNORMAL LOW (ref 39.0–52.0)
Hemoglobin: 10.5 g/dL — ABNORMAL LOW (ref 13.0–17.0)
MCH: 26.8 pg (ref 26.0–34.0)
MCHC: 31.7 g/dL (ref 30.0–36.0)
MCV: 84.4 fL (ref 80.0–100.0)
Platelets: 332 10*3/uL (ref 150–400)
RBC: 3.92 MIL/uL — ABNORMAL LOW (ref 4.22–5.81)
RDW: 14.9 % (ref 11.5–15.5)
WBC: 6.6 10*3/uL (ref 4.0–10.5)
nRBC: 0 % (ref 0.0–0.2)

## 2021-05-12 LAB — TROPONIN I (HIGH SENSITIVITY)
Troponin I (High Sensitivity): 103 ng/L (ref <18)
Troponin I (High Sensitivity): 88 ng/L — ABNORMAL HIGH (ref <18)

## 2021-05-12 LAB — HEPATITIS B SURFACE ANTIGEN: Hepatitis B Surface Ag: NONREACTIVE

## 2021-05-12 LAB — CBG MONITORING, ED: Glucose-Capillary: 67 mg/dL — ABNORMAL LOW (ref 70–99)

## 2021-05-12 MED ORDER — ONDANSETRON HCL 4 MG/2ML IJ SOLN: 4.0000 mg | Freq: Four times a day (QID) | INTRAMUSCULAR | Status: DC | PRN

## 2021-05-12 MED ORDER — ALTEPLASE 2 MG IJ SOLR: 2.0000 mg | Freq: Once | INTRAMUSCULAR | Status: DC | PRN

## 2021-05-12 MED ORDER — HEPARIN SODIUM (PORCINE) 1000 UNIT/ML DIALYSIS: 1000.0000 [IU] | INTRAMUSCULAR | Status: DC | PRN

## 2021-05-12 MED ORDER — SODIUM BICARBONATE 650 MG PO TABS
650.0000 mg | ORAL_TABLET | Freq: Three times a day (TID) | ORAL | Status: DC
  Administered 2021-05-12 – 2021-05-13 (×4): 650 mg via ORAL
  Filled 2021-05-12 (×4): qty 1

## 2021-05-12 MED ORDER — LIDOCAINE-PRILOCAINE 2.5-2.5 % EX CREA: 1.0000 "application " | TOPICAL_CREAM | CUTANEOUS | Status: DC | PRN

## 2021-05-12 MED ORDER — INSULIN GLARGINE-YFGN 100 UNIT/ML ~~LOC~~ SOLN
20.0000 [IU] | Freq: Every day | SUBCUTANEOUS | Status: DC
Start: 2021-05-12 — End: 2021-05-13
  Administered 2021-05-12: 22:00:00 20 [IU] via SUBCUTANEOUS
  Filled 2021-05-12 (×2): qty 0.2

## 2021-05-12 MED ORDER — NITROGLYCERIN 0.4 MG SL SUBL: 0.4000 mg | SUBLINGUAL_TABLET | SUBLINGUAL | Status: DC | PRN

## 2021-05-12 MED ORDER — SODIUM CHLORIDE 0.9 % IV SOLN: 100.0000 mL | INTRAVENOUS | Status: DC | PRN

## 2021-05-12 MED ORDER — LIDOCAINE HCL (PF) 1 % IJ SOLN: 5.0000 mL | INTRAMUSCULAR | Status: DC | PRN

## 2021-05-12 MED ORDER — CHLORHEXIDINE GLUCONATE CLOTH 2 % EX PADS
6.0000 | MEDICATED_PAD | Freq: Every day | CUTANEOUS | Status: DC
  Administered 2021-05-12: 10:00:00 6 via TOPICAL

## 2021-05-12 MED ORDER — PENTAFLUOROPROP-TETRAFLUOROETH EX AERO: 1.0000 "application " | INHALATION_SPRAY | CUTANEOUS | Status: DC | PRN

## 2021-05-12 NOTE — ED Notes (Signed)
Patients mother Lopaka Karge would like an update [phone]

## 2021-05-12 NOTE — Progress Notes (Signed)
PROGRESS NOTE    [name]  DDU:[id number] DOB: 1970/12/29 DOA: 05/11/2021 PCP: Vevelyn Francois, NP   Brief Narrative:  HPI: [name] is a 50 y.o. male with medical history significant of ESRD on HD TTS, chronic combined CHF (EF 30 to 35% and G1DD on echo done in July 2022), hypertension, hyperlipidemia, insulin-dependent type 2 diabetes, depression, anxiety, history of medical noncompliance presented to the ED for evaluation of high blood pressure in the setting of not taking his home medications and not going for dialysis for the past 3 weeks.  Also complained of intermittent chest pain for the last week.  In the ED, blood pressure elevated on arrival with systolic in the 237S and diastolic above 283.  Oxygen saturation in the upper 80s on room air, placed on 2 L supplemental oxygen.  Labs showing no leukocytosis.  Hemoglobin 11.6, stable.  Bicarb 13.  BNP 2248.  COVID and influenza PCR negative.  High-sensitivity troponin 89, no significant change from baseline.  EKG without acute ischemic changes.  Chest x-ray showing moderate cardiomegaly with mild interstitial edema and mild bibasilar atelectasis. Nephrology consulted and felt that he did not need urgent dialysis overnight.  Recommended giving IV Lasix 120 mg (patient told ED PA he makes urine) and plan for routine dialysis tomorrow.  Patient was given IV Lasix 120 mg, amlodipine 10 mg, and IV labetalol 20 mg.   History limited as patient did not seem interested in talking to me.  States he has not gone for dialysis since he left the hospital 3 weeks ago.  States he does not go for dialysis as it makes him have diarrhea afterwards and also because he did not have transportation.  Reports chronic shortness of breath, no change.  Reports having chest pain "for a while."  He describes it as pain all across his chest.  Reports cough.  Denies fevers.  States he had nausea and vomiting last week but is no longer vomiting.  He is not having  any diarrhea at present.  He is not taking his home medications regularly and has missed doses for several days.  Assessment & Plan:   Principal Problem:   Volume overload Active Problems:   Hyperlipidemia LDL goal <70   Essential hypertension   Hypertensive urgency   CHF exacerbation (HCC)  Acute hypoxic respiratory failure secondary to volume overload in the setting of missed hemodialysis/ medical noncompliance and acute on chronic combined CHF/ESRD on HD TTS Missed dialysis x3 weeks.  Echo done in July 2022 showing EF 30 to 35% and grade 1 diastolic dysfunction.  BNP significantly elevated at 2248. Chest x-ray showing mild interstitial edema.  Hypoxic to the upper 80s on room air, initially required 2 L oxygen, given Lasix in the ED.  This morning, feels good.  Still has crackles but does not complain of any shortness of breath and he is not hypoxic.  He is going to get his dialysis today and hopefully that will help.   Metabolic acidosis secondary to missed hemodialysis: Nephrology on board, per nephrology.   Chest pain: Likely atypical.  No more chest pain this morning.  No acute changes on the EKG.  Troponin slightly elevated but at baseline and he is ESRD patient as well.   Hypertensive urgency Due to volume overload in the setting of medication and HD noncompliance.  Patient was given antihypertensives in the ED, blood pressure now slightly improved. Continue amlodipine, Coreg, and hydralazine.  IV hydralazine prn SBP >180.  Hyperlipidemia -Continue Lipitor   Poorly controlled insulin-dependent type 2 diabetes A1c 8.8 on 02/18/2021, suspect due to medication noncompliance.  For some reason, his home medications include Lantus 25 units but he claims that he takes Lantus 10 units twice daily.  He was slightly hypoglycemic this morning.  We will reduce Lantus to 20 units and continue SSI.   Depression, anxiety -Continue Zoloft   QT prolongation Seen on prior tracings as well  and no significant change.  Potassium and magnesium levels normal. -Cardiac monitoring, monitor electrolytes  Chronic noncompliance: I am not sure if there is any fix for this gentleman.  He has had multiple admissions, multiple counseling's done but he is not even interested in changing his lifestyle and following recommendations.    DVT prophylaxis: heparin injection 5,000 Units Start: 05/11/21 2200   Code Status: Full Code  Family Communication:  None present at bedside.  Plan of care discussed with patient in length and he verbalized understanding and agreed with it.  Status is: Inpatient  Remains inpatient appropriate because: Needs hemodialysis.  Estimated body mass index is 27.28 kg/m as calculated from the following:   Height as of this encounter: 5\' 7"  (1.702 m).   Weight as of this encounter: 79 kg. Nutritional Assessment: Body mass index is 27.28 kg/m.Marland Kitchen Seen by dietician.  I agree with the assessment and plan as outlined below: Nutrition Status:   Skin Assessment: I have examined the patient's skin and I agree with the wound assessment as performed by the wound care RN as outlined below:    Consultants:  Nephrology  Procedures:  None  Antimicrobials:  Anti-infectives (From admission, onward)    None          Subjective: Seen and examined, sitting at the edge of the bed.  Feeling better.  For the first time, he is interested in talking a little bit.  No complaints.  Denies shortness of breath.  Objective: Vitals:   05/12/21 0529 05/12/21 0530 05/12/21 0834 05/12/21 1056  BP:  (!) 155/83 (!) 150/80 (!) 148/91  Pulse:   77 78  Resp: (!) 24   20  Temp: 98.2 F (36.8 C)     TempSrc: Oral  Oral Oral  SpO2: 92%   97%  Weight: 79 kg     Height: 5\' 7"  (1.702 m)       Intake/Output Summary (Last 24 hours) at 05/12/2021 1159 Last data filed at 05/12/2021 1000 Gross per 24 hour  Intake 600.04 ml  Output --  Net 600.04 ml   Filed Weights   05/11/21  1814 05/12/21 0529  Weight: 68 kg 79 kg    Examination:  General exam: Appears calm and comfortable  Respiratory system: Faint crackles at the bases bilaterally. Respiratory effort normal. Cardiovascular system: S1 & S2 heard, RRR. No JVD, murmurs, rubs, gallops or clicks.  +2 pitting edema bilateral lower extremity Gastrointestinal system: Abdomen is nondistended, soft and nontender. No organomegaly or masses felt. Normal bowel sounds heard. Central nervous system: Alert and oriented. No focal neurological deficits. Extremities: Symmetric 5 x 5 power. Skin: No rashes, lesions or ulcers Psychiatry: Judgement and insight appear normal. Mood & affect appropriate.    Data Reviewed: I have personally reviewed following labs and imaging studies  CBC: Recent Labs  Lab 05/11/21 1856 05/12/21 0923  WBC 8.6 6.6  NEUTROABS 5.7  --   HGB 11.6* 10.5*  HCT 38.2* 33.1*  MCV 87.4 84.4  PLT 350 160   Basic Metabolic Panel:  Recent Labs  Lab 05/11/21 1856 05/12/21 0220 05/12/21 0923  NA 139 138 139  K 4.5 4.1 4.2  CL 115* 113* 113*  CO2 13* 17* 16*  GLUCOSE 109* 92 100*  BUN 73* 73* 73*  CREATININE 7.27* 7.22* 7.20*  CALCIUM 7.4* 7.0* 7.3*  MG 1.8  --   --   PHOS  --  6.6* 6.6*   GFR: Estimated Creatinine Clearance: 11.7 mL/min (A) (by C-G formula based on SCr of 7.2 mg/dL (H)). Liver Function Tests: Recent Labs  Lab 05/11/21 1856 05/12/21 0220 05/12/21 0923  AST 20  --   --   ALT 17  --   --   ALKPHOS 79  --   --   BILITOT 0.5  --   --   PROT 6.1*  --   --   ALBUMIN 2.4* 2.0* 2.3*   No results for input(s): LIPASE, AMYLASE in the last 168 hours. No results for input(s): AMMONIA in the last 168 hours. Coagulation Profile: No results for input(s): INR, PROTIME in the last 168 hours. Cardiac Enzymes: No results for input(s): CKTOTAL, CKMB, CKMBINDEX, TROPONINI in the last 168 hours. BNP (last 3 results) No results for input(s): PROBNP in the last 8760  hours. HbA1C: No results for input(s): HGBA1C in the last 72 hours. CBG: Recent Labs  Lab 05/11/21 2255 05/12/21 0305 05/12/21 0547 05/12/21 1156  GLUCAP 214* 67* 87 104*   Lipid Profile: No results for input(s): CHOL, HDL, LDLCALC, TRIG, CHOLHDL, LDLDIRECT in the last 72 hours. Thyroid Function Tests: No results for input(s): TSH, T4TOTAL, FREET4, T3FREE, THYROIDAB in the last 72 hours. Anemia Panel: No results for input(s): VITAMINB12, FOLATE, FERRITIN, TIBC, IRON, RETICCTPCT in the last 72 hours. Sepsis Labs: No results for input(s): PROCALCITON, LATICACIDVEN in the last 168 hours.  Recent Results (from the past 240 hour(s))  Resp Panel by RT-PCR (Flu A&B, Covid) Nasopharyngeal Swab     Status: None   Collection Time: 05/11/21  6:45 PM   Specimen: Nasopharyngeal Swab; Nasopharyngeal(NP) swabs in vial transport medium  Result Value Ref Range Status   SARS Coronavirus 2 by RT PCR NEGATIVE NEGATIVE Final    Comment: (NOTE) SARS-CoV-2 target nucleic acids are NOT DETECTED.  The SARS-CoV-2 RNA is generally detectable in upper respiratory specimens during the acute phase of infection. The lowest concentration of SARS-CoV-2 viral copies this assay can detect is 138 copies/mL. A negative result does not preclude SARS-Cov-2 infection and should not be used as the sole basis for treatment or other patient management decisions. A negative result may occur with  improper specimen collection/handling, submission of specimen other than nasopharyngeal swab, presence of viral mutation(s) within the areas targeted by this assay, and inadequate number of viral copies(<138 copies/mL). A negative result must be combined with clinical observations, patient history, and epidemiological information. The expected result is Negative.  Fact Sheet for Patients:  EntrepreneurPulse.com.au  Fact Sheet for Healthcare Providers:  IncredibleEmployment.be  This  test is no t yet approved or cleared by the [place] FDA and  has been authorized for detection and/or diagnosis of SARS-CoV-2 by FDA under an Emergency Use Authorization (EUA). This EUA will remain  in effect (meaning this test can be used) for the duration of the COVID-19 declaration under Section 564(b)(1) of the Act, 21 U.S.C.section 360bbb-3(b)(1), unless the authorization is terminated  or revoked sooner.       Influenza A by PCR NEGATIVE NEGATIVE Final   Influenza B by PCR NEGATIVE NEGATIVE  Final    Comment: (NOTE) The Xpert Xpress SARS-CoV-2/FLU/RSV plus assay is intended as an aid in the diagnosis of influenza from Nasopharyngeal swab specimens and should not be used as a sole basis for treatment. Nasal washings and aspirates are unacceptable for Xpert Xpress SARS-CoV-2/FLU/RSV testing.  Fact Sheet for Patients: EntrepreneurPulse.com.au  Fact Sheet for Healthcare Providers: IncredibleEmployment.be  This test is not yet approved or cleared by the [place] FDA and has been authorized for detection and/or diagnosis of SARS-CoV-2 by FDA under an Emergency Use Authorization (EUA). This EUA will remain in effect (meaning this test can be used) for the duration of the COVID-19 declaration under Section 564(b)(1) of the Act, 21 U.S.C. section 360bbb-3(b)(1), unless the authorization is terminated or revoked.  Performed at [facility], [place] [address] [place], [place] [address]       Radiology Studies: DG Chest 2 View  Result Date: 05/11/2021 CLINICAL DATA:  Shortness of breath. Hypertension. Lack of dialysis for 2-3 weeks. EXAM: CHEST - 2 VIEW COMPARISON:  04/11/2021 FINDINGS: Dialysis catheter from a left IJ approach tip at low SVC. Midline trachea. Moderate cardiomegaly. No pleural effusion or pneumothorax. Mild interstitial edema is similar. Mild bibasilar atelectasis. IMPRESSION: Cardiomegaly with mild congestive  heart failure and bibasilar atelectasis. Electronically Signed   By: Abigail Miyamoto M.D.   On: 05/11/2021 19:50    Scheduled Meds:  amLODipine  10 mg Oral Daily   atorvastatin  40 mg Oral Daily   calcitRIOL  0.25 mcg Oral Daily   carvedilol  12.5 mg Oral BID WC   Chlorhexidine Gluconate Cloth  6 each Topical Q0600   heparin  5,000 Units Subcutaneous Q8H   hydrALAZINE  25 mg Oral TID   insulin aspart  0-5 Units Subcutaneous QHS   insulin aspart  0-6 Units Subcutaneous TID WC   insulin glargine-yfgn  20 Units Subcutaneous Daily   sertraline  50 mg Oral Daily   sodium bicarbonate  650 mg Oral TID   Continuous Infusions:  sodium chloride     sodium chloride       LOS: 1 day   Time spent: 32 minutes   [name], MD [facility]  05/12/2021, 11:59 AM  Please page via Shea Evans and do not message via secure chat for anything urgent. Secure chat can be used for anything non urgent.  How to contact the [facility] Attending or Consulting provider [place] or covering provider during after hours [place], for this patient?  Check the care team in [facility] and look for a) attending/consulting [facility] provider listed and b) the [facility] team listed. Page or secure chat [phone]. Log into [email or web address] and use [place]'s universal password to access. If you do not have the password, please contact the hospital operator. Locate the [facility] provider you are looking for under [facility] and page to a number that you can be directly reached. If you still have difficulty reaching the provider, please page the [facility] (Director on Call) for the Hospitalists listed on amion for assistance.

## 2021-05-12 NOTE — ED Notes (Signed)
Per Marlowe Sax, MD to contact Alcario Drought, DO (floor coverage) to ask if he can place orders for elevating troponin's.   Called [phone] to ask them to initiate purple man. They informed me that since there was an critical troponin and if the pt is having cardiac issues they needed to be progressive care. Alcario Drought, DO and ED charge made aware.

## 2021-05-12 NOTE — Consult Note (Signed)
ESRD Consult Note  Assessment/Recommendations:  # ESRD: -noncompliant with HD (ongoing issue), last HD prior to admit was on 03/27/2021 -outpatient orders: [facility], TTS, 4 hours, F1 180, 400/500, EDW 74 kg, 2K/2.5 Cal, TDC.  Heparin 2000 units bolus, Mircera 50 mcg every 2 weeks (last dose 03/27/2021), calcitriol 0.25 MCG 3 times weekly -HD today  # AHRF secondary to pulm edema -UF as tolerated, given lasix as he still makes urine  # Metabolic acidosis -secondary to HD noncompliance, resume HD here, monitor  # Chest pain -per primary  # Diarrhea -per primary, warrants work up since this has been a limiting factor for him to go to dialysis  # Volume/ hypertension: EDW 74kg. Attempt to achieve EDW as tolerated  # Anemia of Chronic Kidney Disease: Hemoglobin 11.6. hold on esa's for now, monitor, will check an iron panel.   # Secondary Hyperparathyroidism/Hyperphosphatemia: resume calcitriol, renal diet, will check a PTH   # Vascular access: has a TDC and a AVG that's ready to be used however he has not been going to dialysis. Will cannulate here.  # Uncontrolled DM2 -per primary  # Additional recommendations: - Dose all meds for creatinine clearance < 10 ml/min  - Unless absolutely necessary, no MRIs with gadolinium.  - Implement save arm precautions.  Prefer needle sticks in the dorsum of the hands or wrists.  No blood pressure measurements in arm. - If blood transfusion is requested during hemodialysis sessions, please alert [place] prior to the session.  - If a hemodialysis catheter line culture is requested, please alert [place] as only hemodialysis nurses are able to collect those specimens.   Gean Quint, MD [facility]   History of Present Illness: [name] is a/an 50 y.o. male with a past medical history of ESRD (noncompliant with HD), combined CHF, IDDM2, HTN, depression/anxiety, h/o medical noncompliance who presents with chest pain, high BP. Was recently  admitted here in Nov w/ similar issues, has not gone to his outpatient dialysis since then (looks like the last time he had dialysis was a month ago). Patient reports that he has not been going to dialysis because he has not been feeling well. He reports that he has been having diarrhea but has not talked to anyone about this. Not very communicative today. No other complaints.   Medications:  Current Facility-Administered Medications  Medication Dose Route Frequency Provider Last Rate Last Admin   acetaminophen (TYLENOL) tablet 650 mg  650 mg Oral Q6H PRN Shela Leff, MD       Or   acetaminophen (TYLENOL) suppository 650 mg  650 mg Rectal Q6H PRN Shela Leff, MD       amLODipine (NORVASC) tablet 10 mg  10 mg Oral Daily Shela Leff, MD       atorvastatin (LIPITOR) tablet 40 mg  40 mg Oral Daily Shela Leff, MD   40 mg at 05/11/21 2243   calcitRIOL (ROCALTROL) capsule 0.25 mcg  0.25 mcg Oral Daily Shela Leff, MD   0.25 mcg at 05/11/21 2243   carvedilol (COREG) tablet 12.5 mg  12.5 mg Oral BID WC Shela Leff, MD       heparin injection 5,000 Units  5,000 Units Subcutaneous Quay Burow, MD   5,000 Units at 05/12/21 0543   hydrALAZINE (APRESOLINE) injection 5 mg  5 mg Intravenous Q4H PRN Shela Leff, MD       hydrALAZINE (APRESOLINE) tablet 25 mg  25 mg Oral TID Shela Leff, MD   25 mg  at 05/11/21 2243   insulin aspart (novoLOG) injection 0-5 Units  0-5 Units Subcutaneous QHS Shela Leff, MD   2 Units at 05/11/21 2301   insulin aspart (novoLOG) injection 0-6 Units  0-6 Units Subcutaneous TID WC Shela Leff, MD       insulin glargine-yfgn (SEMGLEE) injection 25 Units  25 Units Subcutaneous Daily Shela Leff, MD   25 Units at 05/11/21 2244   nitroGLYCERIN (NITROSTAT) SL tablet 0.4 mg  0.4 mg Sublingual Q5 min PRN Etta Quill, DO       ondansetron [facility]) injection 4 mg  4 mg Intravenous Q6H PRN Etta Quill, DO       sertraline (ZOLOFT) tablet 50 mg  50 mg Oral Daily Shela Leff, MD   50 mg at 05/11/21 2248   sodium bicarbonate tablet 650 mg  650 mg Oral TID Shela Leff, MD         ALLERGIES Sulfa antibiotics, Bactrim [sulfamethoxazole-trimethoprim], Cephalexin, and Other  MEDICAL HISTORY Past Medical History:  Diagnosis Date   Anxiety 02/2019   Anxiety 10/2019   Diabetes mellitus type II, uncontrolled 07/17/2006   Elevated alkaline phosphatase level 01/2019   ESSENTIAL HYPERTENSION 07/17/2006   HYPERLIPIDEMIA 11/24/2008   Insomnia 02/2019   Left bundle branch block 02/22/2019   Lesion of lip 10/2019   Pneumonia 10/22/2011   Suicidal ideations 02/2019   Vitamin D deficiency 01/2019     SOCIAL HISTORY Social History   Socioeconomic History   Marital status: Legally Separated    Spouse name: Not on file   Number of children: Not on file   Years of education: Not on file   Highest education level: Not on file  Occupational History   Occupation: cook    Comment: lost job   Occupation: Biochemist, clinical    Comment: Updated June 2013   Occupation: [place]: Began Jan 2014  Tobacco Use   Smoking status: Never   Smokeless tobacco: Never  Vaping Use   Vaping Use: Never used  Substance and Sexual Activity   Alcohol use: No    Alcohol/week: 0.0 standard drinks   Drug use: No   Sexual activity: Yes    Partners: Female  Other Topics Concern   Not on file  Social History Narrative   Lives with his mother and brother.   Wife has had a foot amputation; is a type 1 DM.   Social Determinants of Health   Financial Resource Strain: Not on file  Food Insecurity: Not on file  Transportation Needs: Not on file  Physical Activity: Not on file  Stress: Not on file  Social Connections: Not on file  Intimate Partner Violence: Not on file     FAMILY HISTORY Family History  Problem Relation Age of Onset   Hypertension Mother 48   Cancer Mother         bladder cancer   Alcohol abuse Father    Hypertension Brother      Review of Systems: 12 systems were reviewed and negative except per HPI  Physical Exam: Vitals:   05/12/21 0529 05/12/21 0530  BP:  (!) 155/83  Pulse:    Resp: (!) 24   Temp: 98.2 F (36.8 C)   SpO2: 92%    Total I/O In: 240 [I.V.:237; Other:3] Out: -   Intake/Output Summary (Last 24 hours) at 05/12/2021 7253 Last data filed at 05/12/2021 0600 Gross per 24 hour  Intake 240.04 ml  Output --  Net  240.04 ml   General: no acute distress CV: normal rate, no murmurs, no edema Lungs: bibasilar rales Abd: soft, non-tender, non-distended Skin: no visible lesions or rashes Edema: 2+ pitting edema b/l LEs Neuro: flat affect, normal speech, no gross focal deficits  Dialysis access: LIJ TDC, RUE AVG (+b/t)  Test Results Reviewed Lab Results  Component Value Date   NA 138 05/12/2021   K 4.1 05/12/2021   CL 113 (H) 05/12/2021   CO2 17 (L) 05/12/2021   BUN 73 (H) 05/12/2021   CREATININE 7.22 (H) 05/12/2021   CALCIUM 7.0 (L) 05/12/2021   ALBUMIN 2.0 (L) 05/12/2021   PHOS 6.6 (H) 05/12/2021    I have reviewed relevant outside healthcare records

## 2021-05-12 NOTE — ED Notes (Signed)
Pt ambulatory to and from restroom.

## 2021-05-13 DIAGNOSIS — I10 Essential (primary) hypertension: Secondary | ICD-10-CM

## 2021-05-13 DIAGNOSIS — I5043 Acute on chronic combined systolic (congestive) and diastolic (congestive) heart failure: Secondary | ICD-10-CM

## 2021-05-13 DIAGNOSIS — E785 Hyperlipidemia, unspecified: Secondary | ICD-10-CM

## 2021-05-13 LAB — HEMOGLOBIN A1C
Hgb A1c MFr Bld: 7.1 % — ABNORMAL HIGH (ref 4.8–5.6)
Mean Plasma Glucose: 157.07 mg/dL

## 2021-05-13 LAB — IRON AND TIBC
Iron: 34 ug/dL — ABNORMAL LOW (ref 45–182)
Saturation Ratios: 11 % — ABNORMAL LOW (ref 17.9–39.5)
TIBC: 297 ug/dL (ref 250–450)
UIBC: 263 ug/dL

## 2021-05-13 LAB — GLUCOSE, CAPILLARY
Glucose-Capillary: 101 mg/dL — ABNORMAL HIGH (ref 70–99)
Glucose-Capillary: 52 mg/dL — ABNORMAL LOW (ref 70–99)
Glucose-Capillary: 64 mg/dL — ABNORMAL LOW (ref 70–99)
Glucose-Capillary: 98 mg/dL (ref 70–99)
Glucose-Capillary: 82 mg/dL (ref 70–99)
Glucose-Capillary: 82 mg/dL (ref 70–99)

## 2021-05-13 LAB — FERRITIN: Ferritin: 196 ng/mL (ref 24–336)

## 2021-05-13 MED ORDER — INSULIN GLARGINE 100 UNIT/ML SOLOSTAR PEN
10.0000 [IU] | PEN_INJECTOR | Freq: Every day | SUBCUTANEOUS | 0 refills | Status: DC
  Filled 2021-05-13 – 2021-08-02 (×2): qty 3, 30d supply, fill #0

## 2021-05-13 MED ORDER — CALCIUM ACETATE (PHOS BINDER) 667 MG PO CAPS
667.0000 mg | ORAL_CAPSULE | Freq: Three times a day (TID) | ORAL | Status: DC
  Administered 2021-05-13: 13:00:00 667 mg via ORAL
  Filled 2021-05-13: qty 1

## 2021-05-13 MED ORDER — INSULIN GLARGINE-YFGN 100 UNIT/ML ~~LOC~~ SOLN
10.0000 [IU] | Freq: Every day | SUBCUTANEOUS | Status: DC
  Filled 2021-05-13: qty 0.1

## 2021-05-13 NOTE — Progress Notes (Signed)
Ready to d/c. Still waiting for a ride

## 2021-05-13 NOTE — Progress Notes (Signed)
Per pt ride will be able to come and pick him up after lunch.

## 2021-05-13 NOTE — Discharge Summary (Signed)
Physician Discharge Summary  [name] RJJ:[id number] DOB: 20-Sep-1970 DOA: 05/11/2021  PCP: Vevelyn Francois, NP  Admit date: 05/11/2021 Discharge date: 05/13/2021 30 Day Unplanned Readmission Risk Score    Flowsheet Row ED to Hosp-Admission (Current) from 05/11/2021 in [facility]  30 Day Unplanned Readmission Risk Score (%) 39.11 Filed at 05/13/2021 0801       This score is the patient's risk of an unplanned readmission within 30 days of being discharged (0 -100%). The score is based on dignosis, age, lab data, medications, orders, and past utilization.   Low:  0-14.9   Medium: 15-21.9   High: 22-29.9   Extreme: 30 and above          Admitted From: Home Disposition: Home  Recommendations for Outpatient Follow-up:  Follow up with PCP in 1-2 weeks Please obtain BMP/CBC in one week Please follow up with your PCP on the following pending results: Unresulted Labs (From admission, onward)     Start     Ordered   05/13/21 0819  Hemoglobin A1c  Once,   R       Question:  Specimen collection method  Answer:  Lab=Lab collect   05/13/21 0818   05/13/21 0752  Iron and TIBC  Once,   R       Question:  Specimen collection method  Answer:  Lab=Lab collect   05/13/21 0751   05/13/21 0752  Ferritin  Once,   R       Question:  Specimen collection method  Answer:  Lab=Lab collect   05/13/21 0751   05/12/21 0912  PTH, intact and calcium  Once,   R        05/12/21 0911   05/12/21 0716  Hepatitis B surface antibody,quantitative  (New Admission Hemo Labs (Hepatitis B))  Once,   R        05/12/21 0715              Home Health: None Equipment/Devices: None  Discharge Condition: Stable CODE STATUS: Full code Diet recommendation: Low-sodium  Subjective: Seen and examined.  No complaints.  Saturating well over 90% on room air.  Following HPI is copied from my colleague admitting hospitalist Dr. Elon Jester H&P. HPI: [name] is a 50 y.o. male with  medical history significant of ESRD on HD TTS, chronic combined CHF (EF 30 to 35% and G1DD on echo done in July 2022), hypertension, hyperlipidemia, insulin-dependent type 2 diabetes, depression, anxiety, history of medical noncompliance presented to the ED for evaluation of high blood pressure in the setting of not taking his home medications and not going for dialysis for the past 3 weeks.  Also complained of intermittent chest pain for the last week.  In the ED, blood pressure elevated on arrival with systolic in the 016W and diastolic above 109.  Oxygen saturation in the upper 80s on room air, placed on 2 L supplemental oxygen.  Labs showing no leukocytosis.  Hemoglobin 11.6, stable.  Bicarb 13.  BNP 2248.  COVID and influenza PCR negative.  High-sensitivity troponin 89, no significant change from baseline.  EKG without acute ischemic changes.  Chest x-ray showing moderate cardiomegaly with mild interstitial edema and mild bibasilar atelectasis. Nephrology consulted and felt that he did not need urgent dialysis overnight.  Recommended giving IV Lasix 120 mg (patient told ED PA he makes urine) and plan for routine dialysis tomorrow.  Patient was given IV Lasix 120 mg, amlodipine 10 mg, and IV  labetalol 20 mg.   History limited as patient did not seem interested in talking to me.  States he has not gone for dialysis since he left the hospital 3 weeks ago.  States he does not go for dialysis as it makes him have diarrhea afterwards and also because he did not have transportation.  Reports chronic shortness of breath, no change.  Reports having chest pain "for a while."  He describes it as pain all across his chest.  Reports cough.  Denies fevers.  States he had nausea and vomiting last week but is no longer vomiting.  He is not having any diarrhea at present.  He is not taking his home medications regularly and has missed doses for several days.   Brief/Interim Summary: Patient was admitted yet again for the  similar problem, noncompliance, missing hemodialysis, not taking his medications, admitted with acute on chronic combined systolic congestive heart failure and acute hypoxic respiratory failure as well as hypertensive urgency.  Nephrology was consulted.  He was also given Lasix, he had diuresis.  Soon he was weaned down from 2 L oxygen to room air.  Underwent hemodialysis.  Improved further.  We resumed his home medications for the blood pressure and his blood pressure also improved.  He has chronic but atypical chest pain.  Troponin slightly elevated but that is likely secondary to being ESRD patient.  I have seen this gentleman many times in the hospital and I have spent great length of time every time I see him about the importance of compliance but unfortunately, it goes nowhere, patient continues to remain noncompliant and I fear that he will continue his practices and will soon end up coming back to the hospital.  However at this point in time he is stable so he is being discharged home.  Discharge Diagnoses:  Principal Problem:   Volume overload Active Problems:   Hyperlipidemia LDL goal <70   Essential hypertension   Acute on chronic combined systolic and diastolic CHF (congestive heart failure) (HCC)   Hypertensive urgency    Discharge Instructions   Allergies as of 05/13/2021       Reactions   Sulfa Antibiotics Rash   Severe rash. Do not give sulfa medications   Bactrim [sulfamethoxazole-trimethoprim]    Rash   Cephalexin    Patient with severe drug reaction including exfoliating skin rash and hypotension after being prescribed both cephalexin and sulfamethoxazole-trimethoprim simultaneously. Favor SMX as much more likely culprit as patient had tolerated other cephalosporins in the past, but cannot say with complete certainty that this was not related to cephalexin.    Other    Patient states he is allergic to the TB test        Medication List     TAKE these medications     Accu-Chek Guide test strip Generic drug: glucose blood Use to check fasting blood sugar once daily. 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 Insulin dependent   Accu-Chek Softclix Lancets lancets USE TO MEASURE BLOOD SUGAR TWICE A DAY   amLODipine 10 MG tablet Commonly known as: Norvasc Take 1 tablet by mouth once a day   atorvastatin 40 MG tablet Commonly known as: LIPITOR Take 1 tablet (40 mg total) by mouth daily.   calcitRIOL 0.25 MCG capsule Commonly known as: ROCALTROL Take 1 capsule (0.25 mcg total) by mouth daily.   carvedilol 12.5 MG tablet Commonly known as: COREG Take 1 tablet (12.96m total) by mouth two times a day with a meal.   furosemide 40  MG tablet Commonly known as: LASIX Take 1 tablet (40 mg total) by mouth daily.   hydrALAZINE 25 MG tablet Commonly known as: APRESOLINE Take 1 tablet by mouth three times a day   insulin glargine 100 UNIT/ML Solostar Pen Commonly known as: LANTUS Inject 10 Units into the skin daily. May substitute with any other long-acting formulary. What changed: how much to take   MIRCERA IJ Mircera   NovoLOG FlexPen 100 UNIT/ML FlexPen Generic drug: insulin aspart Inject 5 Units into the skin 2 (two) times daily with a meal.   Pentips 32G X 4 MM Misc Generic drug: Insulin Pen Needle Use with insulin pens   sertraline 50 MG tablet Commonly known as: ZOLOFT Take 1 tablet (50 mg total) by mouth daily.   True Metrix Meter w/Device Kit Use to measure blood sugar twice a day   Accu-Chek Guide w/Device Kit Use as directed        Follow-up Information     Vevelyn Francois, NP Follow up in 1 week(s).   Specialty: Adult Health Nurse Practitioner Contact information: [address] [place] [address] [phone]         Lorretta Harp, MD .   Specialties: Cardiology, Radiology Contact information: [place] [address] [place] [address] [phone]                Allergies  Allergen Reactions    Sulfa Antibiotics Rash    Severe rash. Do not give sulfa medications   Bactrim [Sulfamethoxazole-Trimethoprim]     Rash   Cephalexin     Patient with severe drug reaction including exfoliating skin rash and hypotension after being prescribed both cephalexin and sulfamethoxazole-trimethoprim simultaneously. Favor SMX as much more likely culprit as patient had tolerated other cephalosporins in the past, but cannot say with complete certainty that this was not related to cephalexin.    Other     Patient states he is allergic to the TB test    Consultations: Nephrology   Procedures/Studies: DG Chest 2 View  Result Date: 05/11/2021 CLINICAL DATA:  Shortness of breath. Hypertension. Lack of dialysis for 2-3 weeks. EXAM: CHEST - 2 VIEW COMPARISON:  04/11/2021 FINDINGS: Dialysis catheter from a left IJ approach tip at low SVC. Midline trachea. Moderate cardiomegaly. No pleural effusion or pneumothorax. Mild interstitial edema is similar. Mild bibasilar atelectasis. IMPRESSION: Cardiomegaly with mild congestive heart failure and bibasilar atelectasis. Electronically Signed   By: Abigail Miyamoto M.D.   On: 05/11/2021 19:50     Discharge Exam: Vitals:   05/13/21 0434 05/13/21 0729  BP: (!) 145/83 140/78  Pulse:  72  Resp: 17   Temp: 98.9 F (37.2 C) 98.8 F (37.1 C)  SpO2: 92% 91%   Vitals:   05/13/21 0036 05/13/21 0434 05/13/21 0440 05/13/21 0729  BP: 132/75 (!) 145/83  140/78  Pulse: 76   72  Resp: 18 17    Temp: 98 F (36.7 C) 98.9 F (37.2 C)  98.8 F (37.1 C)  TempSrc: Axillary Oral  Oral  SpO2: 92% 92%  91%  Weight:   74.5 kg   Height:        General: Pt is alert, awake, not in acute distress Cardiovascular: RRR, S1/S2 +, no rubs, no gallops Respiratory: CTA bilaterally, no wheezing, no rhonchi Abdominal: Soft, NT, ND, bowel sounds + Extremities: +2 pitting edema bilateral lower extremity, no cyanosis    The results of significant diagnostics from this hospitalization  (including imaging, microbiology,  ancillary and laboratory) are listed below for reference.     Microbiology: Recent Results (from the past 240 hour(s))  Resp Panel by RT-PCR (Flu A&B, Covid) Nasopharyngeal Swab     Status: None   Collection Time: 05/11/21  6:45 PM   Specimen: Nasopharyngeal Swab; Nasopharyngeal(NP) swabs in vial transport medium  Result Value Ref Range Status   SARS Coronavirus 2 by RT PCR NEGATIVE NEGATIVE Final    Comment: (NOTE) SARS-CoV-2 target nucleic acids are NOT DETECTED.  The SARS-CoV-2 RNA is generally detectable in upper respiratory specimens during the acute phase of infection. The lowest concentration of SARS-CoV-2 viral copies this assay can detect is 138 copies/mL. A negative result does not preclude SARS-Cov-2 infection and should not be used as the sole basis for treatment or other patient management decisions. A negative result may occur with  improper specimen collection/handling, submission of specimen other than nasopharyngeal swab, presence of viral mutation(s) within the areas targeted by this assay, and inadequate number of viral copies(<138 copies/mL). A negative result must be combined with clinical observations, patient history, and epidemiological information. The expected result is Negative.  Fact Sheet for Patients:  EntrepreneurPulse.com.au  Fact Sheet for Healthcare Providers:  IncredibleEmployment.be  This test is no t yet approved or cleared by the [place] FDA and  has been authorized for detection and/or diagnosis of SARS-CoV-2 by FDA under an Emergency Use Authorization (EUA). This EUA will remain  in effect (meaning this test can be used) for the duration of the COVID-19 declaration under Section 564(b)(1) of the Act, 21 U.S.C.section 360bbb-3(b)(1), unless the authorization is terminated  or revoked sooner.       Influenza A by PCR NEGATIVE NEGATIVE Final   Influenza B by PCR  NEGATIVE NEGATIVE Final    Comment: (NOTE) The Xpert Xpress SARS-CoV-2/FLU/RSV plus assay is intended as an aid in the diagnosis of influenza from Nasopharyngeal swab specimens and should not be used as a sole basis for treatment. Nasal washings and aspirates are unacceptable for Xpert Xpress SARS-CoV-2/FLU/RSV testing.  Fact Sheet for Patients: EntrepreneurPulse.com.au  Fact Sheet for Healthcare Providers: IncredibleEmployment.be  This test is not yet approved or cleared by the [place] FDA and has been authorized for detection and/or diagnosis of SARS-CoV-2 by FDA under an Emergency Use Authorization (EUA). This EUA will remain in effect (meaning this test can be used) for the duration of the COVID-19 declaration under Section 564(b)(1) of the Act, 21 U.S.C. section 360bbb-3(b)(1), unless the authorization is terminated or revoked.  Performed at [facility], [place] [address] [place], [place] [address]   MRSA Next Gen by PCR, Nasal     Status: None   Collection Time: 05/12/21 12:12 PM   Specimen: Nasal Mucosa; Nasal Swab  Result Value Ref Range Status   MRSA by PCR Next Gen NOT DETECTED NOT DETECTED Final    Comment: (NOTE) The GeneXpert MRSA Assay (FDA approved for NASAL specimens only), is one component of a comprehensive MRSA colonization surveillance program. It is not intended to diagnose MRSA infection nor to guide or monitor treatment for MRSA infections. Test performance is not FDA approved in patients less than 88 years old. Performed at [facility], [place] [address], [place], [place] [address]      Labs: BNP (last 3 results) Recent Labs    02/17/21 1527 03/13/21 1248 05/11/21 1840  BNP 1,329.8* 1,513.1* 8,295.6*   Basic Metabolic Panel: Recent Labs  Lab 05/11/21 1856 05/12/21 0220 05/12/21 2130  NA 139 138 139  K 4.5 4.1 4.2  CL 115* 113* 113*  CO2 13* 17* 16*  GLUCOSE 109* 92 100*  BUN 73* 73*  73*  CREATININE 7.27* 7.22* 7.20*  CALCIUM 7.4* 7.0* 7.3*  MG 1.8  --   --   PHOS  --  6.6* 6.6*   Liver Function Tests: Recent Labs  Lab 05/11/21 1856 05/12/21 0220 05/12/21 0923  AST 20  --   --   ALT 17  --   --   ALKPHOS 79  --   --   BILITOT 0.5  --   --   PROT 6.1*  --   --   ALBUMIN 2.4* 2.0* 2.3*   No results for input(s): LIPASE, AMYLASE in the last 168 hours. No results for input(s): AMMONIA in the last 168 hours. CBC: Recent Labs  Lab 05/11/21 1856 05/12/21 0923  WBC 8.6 6.6  NEUTROABS 5.7  --   HGB 11.6* 10.5*  HCT 38.2* 33.1*  MCV 87.4 84.4  PLT 350 332   Cardiac Enzymes: No results for input(s): CKTOTAL, CKMB, CKMBINDEX, TROPONINI in the last 168 hours. BNP: Invalid input(s): POCBNP CBG: Recent Labs  Lab 05/12/21 1156 05/12/21 1658 05/13/21 0515 05/13/21 0528 05/13/21 0542  GLUCAP 104* 128* 52* 64* 98   D-Dimer No results for input(s): DDIMER in the last 72 hours. Hgb A1c No results for input(s): HGBA1C in the last 72 hours. Lipid Profile No results for input(s): CHOL, HDL, LDLCALC, TRIG, CHOLHDL, LDLDIRECT in the last 72 hours. Thyroid function studies No results for input(s): TSH, T4TOTAL, T3FREE, THYROIDAB in the last 72 hours.  Invalid input(s): FREET3 Anemia work up No results for input(s): VITAMINB12, FOLATE, FERRITIN, TIBC, IRON, RETICCTPCT in the last 72 hours. Urinalysis    Component Value Date/Time   COLORURINE YELLOW 02/19/2021 0045   APPEARANCEUR HAZY (A) 02/19/2021 0045   APPEARANCEUR Clear 05/09/2020 1509   LABSPEC 1.013 02/19/2021 0045   PHURINE 5.0 02/19/2021 0045   GLUCOSEU >=500 (A) 02/19/2021 0045   HGBUR MODERATE (A) 02/19/2021 0045   BILIRUBINUR NEGATIVE 02/19/2021 0045   BILIRUBINUR Negative 05/09/2020 1509   KETONESUR NEGATIVE 02/19/2021 0045   PROTEINUR >=300 (A) 02/19/2021 0045   UROBILINOGEN 0.2 02/04/2020 1612   UROBILINOGEN 0.2 02/16/2014 0045   NITRITE NEGATIVE 02/19/2021 0045   LEUKOCYTESUR  NEGATIVE 02/19/2021 0045   Sepsis Labs Invalid input(s): PROCALCITONIN,  WBC,  LACTICIDVEN Microbiology Recent Results (from the past 240 hour(s))  Resp Panel by RT-PCR (Flu A&B, Covid) Nasopharyngeal Swab     Status: None   Collection Time: 05/11/21  6:45 PM   Specimen: Nasopharyngeal Swab; Nasopharyngeal(NP) swabs in vial transport medium  Result Value Ref Range Status   SARS Coronavirus 2 by RT PCR NEGATIVE NEGATIVE Final    Comment: (NOTE) SARS-CoV-2 target nucleic acids are NOT DETECTED.  The SARS-CoV-2 RNA is generally detectable in upper respiratory specimens during the acute phase of infection. The lowest concentration of SARS-CoV-2 viral copies this assay can detect is 138 copies/mL. A negative result does not preclude SARS-Cov-2 infection and should not be used as the sole basis for treatment or other patient management decisions. A negative result may occur with  improper specimen collection/handling, submission of specimen other than nasopharyngeal swab, presence of viral mutation(s) within the areas targeted by this assay, and inadequate number of viral copies(<138 copies/mL). A negative result must be combined with clinical observations, patient history, and epidemiological information. The expected result is Negative.  Fact Sheet for  Patients:  EntrepreneurPulse.com.au  Fact Sheet for Healthcare Providers:  IncredibleEmployment.be  This test is no t yet approved or cleared by the [place] FDA and  has been authorized for detection and/or diagnosis of SARS-CoV-2 by FDA under an Emergency Use Authorization (EUA). This EUA will remain  in effect (meaning this test can be used) for the duration of the COVID-19 declaration under Section 564(b)(1) of the Act, 21 U.S.C.section 360bbb-3(b)(1), unless the authorization is terminated  or revoked sooner.       Influenza A by PCR NEGATIVE NEGATIVE Final   Influenza B by PCR  NEGATIVE NEGATIVE Final    Comment: (NOTE) The Xpert Xpress SARS-CoV-2/FLU/RSV plus assay is intended as an aid in the diagnosis of influenza from Nasopharyngeal swab specimens and should not be used as a sole basis for treatment. Nasal washings and aspirates are unacceptable for Xpert Xpress SARS-CoV-2/FLU/RSV testing.  Fact Sheet for Patients: EntrepreneurPulse.com.au  Fact Sheet for Healthcare Providers: IncredibleEmployment.be  This test is not yet approved or cleared by the [place] FDA and has been authorized for detection and/or diagnosis of SARS-CoV-2 by FDA under an Emergency Use Authorization (EUA). This EUA will remain in effect (meaning this test can be used) for the duration of the COVID-19 declaration under Section 564(b)(1) of the Act, 21 U.S.C. section 360bbb-3(b)(1), unless the authorization is terminated or revoked.  Performed at [facility], [place] [address] [place], [place] [address]   MRSA Next Gen by PCR, Nasal     Status: None   Collection Time: 05/12/21 12:12 PM   Specimen: Nasal Mucosa; Nasal Swab  Result Value Ref Range Status   MRSA by PCR Next Gen NOT DETECTED NOT DETECTED Final    Comment: (NOTE) The GeneXpert MRSA Assay (FDA approved for NASAL specimens only), is one component of a comprehensive MRSA colonization surveillance program. It is not intended to diagnose MRSA infection nor to guide or monitor treatment for MRSA infections. Test performance is not FDA approved in patients less than 18 years old. Performed at [facility], [place] [address], [place], [place] [address]      Time coordinating discharge: Over 30 minutes  SIGNED:   [name], MD  [facility] 05/13/2021, 9:19 AM  If 7PM-7AM, please contact night-coverage [email or web address]

## 2021-05-13 NOTE — Progress Notes (Signed)
[facility] Progress Note    Assessment/ Plan:   # ESRD: -noncompliant with HD (ongoing issue), last HD prior to admit was on 03/27/2021 -outpatient orders: [facility], TTS, 4 hours, F1 180, 400/500, EDW 74 kg, 2K/2.5 Cal, TDC.  Heparin 2000 units bolus, Mircera 50 mcg every 2 weeks (last dose 03/27/2021), calcitriol 0.25 MCG 3 times weekly -Next HD planned for Tues 12/27 if still here   # AHRF secondary to pulm edema -UF as tolerated, resp status better   # Metabolic acidosis -secondary to HD noncompliance, resume HD here, monitor   # Chest pain -per primary   # Diarrhea -per primary, warrants work up since this has been a limiting factor for him to go to dialysis   # Volume/ hypertension: EDW 74kg. Attempt to achieve EDW as tolerated   # Anemia of Chronic Kidney Disease: Hemoglobin 11.6. hold on esa's for now, monitor, will check an iron panel.    # Secondary Hyperparathyroidism/Hyperphosphatemia: resume calcitriol, renal diet, PTH 12/24 pending. Will start phoslo   # Vascular access: has a TDC and a AVG that's ready to be used however he has not been going to dialysis. AVG successfully cannulated on 12/24. If he's still here tomorrow, can set him up to have his TDC removed with IR (likely can't happen today)   # Uncontrolled DM2 -per primary   # Additional recommendations: - Dose all meds for creatinine clearance < 10 ml/min  - Unless absolutely necessary, no MRIs with gadolinium.  - Implement save arm precautions.  Prefer needle sticks in the dorsum of the hands or wrists.  No blood pressure measurements in arm. - If blood transfusion is requested during hemodialysis sessions, please alert [place] prior to the session.  - If a hemodialysis catheter line culture is requested, please alert [place] as only hemodialysis nurses are able to collect those specimens.    Gean Quint, MD [facility]  Subjective:   No acute events. Tolerated hd yesterday w/  successful cannulation of avg. Net uf 4L. He reports that he feels slightly better but otherwise not very communicative.   Objective:   BP 140/78    Pulse 72    Temp 98.8 F (37.1 C) (Oral)    Resp 17    Ht 5\' 7"  (1.702 m)    Wt 74.5 kg    SpO2 91%    BMI 25.72 kg/m   Intake/Output Summary (Last 24 hours) at 05/13/2021 0746 Last data filed at 05/12/2021 2300 Gross per 24 hour  Intake 600 ml  Output 4000 ml  Net -3400 ml   Weight change: 10.4 kg  Physical Exam: Gen:nad CVS:s1s2, rrr Resp:normal wob, cta bl YIF:OYDX, nt/nd AJO:INOMV edema b/l LE's Neuro: flat affect, no focal deficits, awake, alert Dialysis access: LIJ TDC, RUE AVG (+b/t)  Imaging: DG Chest 2 View  Result Date: 05/11/2021 CLINICAL DATA:  Shortness of breath. Hypertension. Lack of dialysis for 2-3 weeks. EXAM: CHEST - 2 VIEW COMPARISON:  04/11/2021 FINDINGS: Dialysis catheter from a left IJ approach tip at low SVC. Midline trachea. Moderate cardiomegaly. No pleural effusion or pneumothorax. Mild interstitial edema is similar. Mild bibasilar atelectasis. IMPRESSION: Cardiomegaly with mild congestive heart failure and bibasilar atelectasis. Electronically Signed   By: Abigail Miyamoto M.D.   On: 05/11/2021 19:50    Labs: BMET Recent Labs  Lab 05/11/21 1856 05/12/21 0220 05/12/21 0923  NA 139 138 139  K 4.5 4.1 4.2  CL 115* 113* 113*  CO2 13* 17* 16*  GLUCOSE 109* 92 100*  BUN 73* 73* 73*  CREATININE 7.27* 7.22* 7.20*  CALCIUM 7.4* 7.0* 7.3*  PHOS  --  6.6* 6.6*   CBC Recent Labs  Lab 05/11/21 1856 05/12/21 0923  WBC 8.6 6.6  NEUTROABS 5.7  --   HGB 11.6* 10.5*  HCT 38.2* 33.1*  MCV 87.4 84.4  PLT 350 332    Medications:     amLODipine  10 mg Oral Daily   atorvastatin  40 mg Oral Daily   calcitRIOL  0.25 mcg Oral Daily   carvedilol  12.5 mg Oral BID WC   Chlorhexidine Gluconate Cloth  6 each Topical Q0600   heparin  5,000 Units Subcutaneous Q8H   hydrALAZINE  25 mg Oral TID   insulin aspart   0-5 Units Subcutaneous QHS   insulin aspart  0-6 Units Subcutaneous TID WC   insulin glargine-yfgn  20 Units Subcutaneous Daily   sertraline  50 mg Oral Daily   sodium bicarbonate  650 mg Oral TID      Gean Quint, MD [facility] 05/13/2021, 7:46 AM

## 2021-05-13 NOTE — Progress Notes (Signed)
Patient discharged: Home with family  Via: Wheelchair   Discharge paperwork given: to patient and family  Reviewed with teach back  IV and telemetry disconnected  Belongings given to patient    

## 2021-05-14 LAB — HEPATITIS B SURFACE ANTIBODY, QUANTITATIVE: Hep B S AB Quant (Post): <3.1 m[IU]/mL — ABNORMAL LOW (ref >9.9)

## 2021-05-15 ENCOUNTER — Telehealth: Payer: Self-pay | Admitting: Nurse Practitioner

## 2021-05-15 ENCOUNTER — Other Ambulatory Visit: Payer: Self-pay

## 2021-05-15 NOTE — Telephone Encounter (Signed)
Transition of care contact from inpatient facility  Date of Discharge: 05/13/2021 Date of Contact:05/15/2021 Method of contact: Phone  Attempted to contact patient to discuss transition of care from inpatient admission. Patient did not answer the phone. Message was left on the patient's voicemail with call back number [phone].

## 2021-05-16 LAB — PTH, INTACT AND CALCIUM
Calcium, Total (PTH): 7.6 mg/dL — ABNORMAL LOW (ref 8.7–10.2)
PTH: 40 pg/mL (ref 15–65)

## 2021-05-18 ENCOUNTER — Other Ambulatory Visit: Payer: Self-pay

## 2021-05-25 ENCOUNTER — Ambulatory Visit: Payer: Self-pay | Admitting: Cardiovascular Disease

## 2021-06-08 ENCOUNTER — Telehealth: Payer: Self-pay

## 2021-06-08 NOTE — Telephone Encounter (Signed)
Attempted to contact patient to reschedule follow up appointment. No answer left a message to return call.

## 2021-06-10 ENCOUNTER — Encounter (HOSPITAL_COMMUNITY): Payer: Self-pay

## 2021-06-10 ENCOUNTER — Emergency Department (HOSPITAL_COMMUNITY)
Admission: EM | Admit: 2021-06-10 | Discharge: 2021-06-11 | Disposition: A | Discharge status: Home / Self Care | Payer: Medicaid Other | Attending: Emergency Medicine | Admitting: Emergency Medicine

## 2021-06-10 DIAGNOSIS — E1122 Type 2 diabetes mellitus with diabetic chronic kidney disease: Secondary | ICD-10-CM | POA: Diagnosis not present

## 2021-06-10 DIAGNOSIS — R609 Edema, unspecified: Secondary | ICD-10-CM

## 2021-06-10 DIAGNOSIS — N186 End stage renal disease: Secondary | ICD-10-CM | POA: Diagnosis not present

## 2021-06-10 DIAGNOSIS — I509 Heart failure, unspecified: Secondary | ICD-10-CM | POA: Insufficient documentation

## 2021-06-10 DIAGNOSIS — Z79899 Other long term (current) drug therapy: Secondary | ICD-10-CM | POA: Insufficient documentation

## 2021-06-10 DIAGNOSIS — Z992 Dependence on renal dialysis: Secondary | ICD-10-CM | POA: Diagnosis not present

## 2021-06-10 DIAGNOSIS — D631 Anemia in chronic kidney disease: Secondary | ICD-10-CM | POA: Diagnosis not present

## 2021-06-10 DIAGNOSIS — M7989 Other specified soft tissue disorders: Secondary | ICD-10-CM | POA: Diagnosis present

## 2021-06-10 DIAGNOSIS — I10 Essential (primary) hypertension: Secondary | ICD-10-CM

## 2021-06-10 DIAGNOSIS — Z794 Long term (current) use of insulin: Secondary | ICD-10-CM | POA: Diagnosis not present

## 2021-06-10 DIAGNOSIS — N189 Chronic kidney disease, unspecified: Secondary | ICD-10-CM

## 2021-06-10 DIAGNOSIS — R600 Localized edema: Secondary | ICD-10-CM | POA: Diagnosis not present

## 2021-06-10 DIAGNOSIS — H547 Unspecified visual loss: Secondary | ICD-10-CM

## 2021-06-10 DIAGNOSIS — I132 Hypertensive heart and chronic kidney disease with heart failure and with stage 5 chronic kidney disease, or end stage renal disease: Secondary | ICD-10-CM | POA: Insufficient documentation

## 2021-06-10 DIAGNOSIS — E785 Hyperlipidemia, unspecified: Secondary | ICD-10-CM

## 2021-06-10 LAB — CBC WITH DIFFERENTIAL/PLATELET
Abs Immature Granulocytes: 0.02 10*3/uL (ref 0.00–0.07)
Basophils Absolute: 0 10*3/uL (ref 0.0–0.1)
Basophils Relative: 0 %
Eosinophils Absolute: 0.2 10*3/uL (ref 0.0–0.5)
Eosinophils Relative: 3 %
HCT: 32.5 % — ABNORMAL LOW (ref 39.0–52.0)
Hemoglobin: 9.8 g/dL — ABNORMAL LOW (ref 13.0–17.0)
Immature Granulocytes: 0 %
Lymphocytes Relative: 17 %
Lymphs Abs: 1.2 10*3/uL (ref 0.7–4.0)
MCH: 26.6 pg (ref 26.0–34.0)
MCHC: 30.2 g/dL (ref 30.0–36.0)
MCV: 88.1 fL (ref 80.0–100.0)
Monocytes Absolute: 0.8 10*3/uL (ref 0.1–1.0)
Monocytes Relative: 11 %
Neutro Abs: 4.8 10*3/uL (ref 1.7–7.7)
Neutrophils Relative %: 69 %
Platelets: 337 10*3/uL (ref 150–400)
RBC: 3.69 MIL/uL — ABNORMAL LOW (ref 4.22–5.81)
RDW: 14.9 % (ref 11.5–15.5)
WBC: 6.9 10*3/uL (ref 4.0–10.5)
nRBC: 0 % (ref 0.0–0.2)

## 2021-06-10 NOTE — ED Triage Notes (Signed)
Pt arrives POV for eval of blt leg swelling and testicular swelling. Tues/Thurs/Sat dialysis, went yesterday.

## 2021-06-10 NOTE — ED Provider Triage Note (Signed)
Emergency Medicine Provider Triage Evaluation Note  SHADOE BETHEL , a 51 y.o. male  was evaluated in triage. Pt complains of bilateral leg swelling onset yesterday.  He has associated bilateral testicular swelling.  Has not tried medications for symptoms. He notes he does dialysis on Tuesday, Thursday, Saturday with his last session being yesterday.  He notes he was unable to complete the entire session yesterday because he had errands to run.  Denies fever, chills, nausea, vomiting.    Review of Systems  Positive: As per HPI below. Negative: Fever, chills  Physical Exam  BP (!) 197/103 (BP Location: Left Arm)    Pulse 94    Temp (!) 97.5 F (36.4 C)    Resp 20    Ht 5\' 7"  (1.702 m)    Wt 75 kg    SpO2 94%    BMI 25.90 kg/m  Gen:   Awake, no distress   Resp:  Normal effort  MSK:   Moves extremities without difficulty   Medical Decision Making  Medically screening exam initiated at 11:17 PM.  Appropriate orders placed.  KAMEN HANKEN was informed that the remainder of the evaluation will be completed by another provider, this initial triage assessment does not replace that evaluation, and the importance of remaining in the ED until their evaluation is complete.   Dylon Correa A, PA-C 06/10/21 [phone]

## 2021-06-11 ENCOUNTER — Other Ambulatory Visit: Payer: Self-pay

## 2021-06-11 LAB — BASIC METABOLIC PANEL
Anion gap: 11 (ref 5–15)
BUN: 45 mg/dL — ABNORMAL HIGH (ref 6–20)
CO2: 25 mmol/L (ref 22–32)
Calcium: 5.8 mg/dL — CL (ref 8.9–10.3)
Chloride: 103 mmol/L (ref 98–111)
Creatinine, Ser: 7.12 mg/dL — ABNORMAL HIGH (ref 0.61–1.24)
GFR, Estimated: 9 mL/min — ABNORMAL LOW (ref >60)
Glucose, Bld: 174 mg/dL — ABNORMAL HIGH (ref 70–99)
Potassium: 4 mmol/L (ref 3.5–5.1)
Sodium: 139 mmol/L (ref 135–145)

## 2021-06-11 MED ORDER — HYDRALAZINE HCL 25 MG PO TABS
25.0000 mg | ORAL_TABLET | Freq: Once | ORAL | Status: AC
  Administered 2021-06-11: 25 mg via ORAL
  Filled 2021-06-11: qty 1

## 2021-06-11 MED ORDER — AMLODIPINE BESYLATE 5 MG PO TABS
10.0000 mg | ORAL_TABLET | Freq: Once | ORAL | Status: AC
Start: 2021-06-11 — End: 2021-06-11
  Administered 2021-06-11: 10 mg via ORAL
  Filled 2021-06-11: qty 2

## 2021-06-11 MED ORDER — FUROSEMIDE 20 MG PO TABS
40.0000 mg | ORAL_TABLET | Freq: Once | ORAL | Status: AC
  Administered 2021-06-11: 40 mg via ORAL
  Filled 2021-06-11: qty 2

## 2021-06-11 MED ORDER — HYDRALAZINE HCL 25 MG PO TABS
25.0000 mg | ORAL_TABLET | Freq: Three times a day (TID) | ORAL | 0 refills | Status: DC
  Filled 2021-06-11 – 2021-08-02 (×2): qty 90, 30d supply, fill #0

## 2021-06-11 MED ORDER — CALCITRIOL 0.25 MCG PO CAPS
0.2500 ug | ORAL_CAPSULE | Freq: Once | ORAL | Status: AC
  Administered 2021-06-11: 0.25 ug via ORAL
  Filled 2021-06-11: qty 1

## 2021-06-11 MED ORDER — ATORVASTATIN CALCIUM 40 MG PO TABS
40.0000 mg | ORAL_TABLET | Freq: Once | ORAL | Status: AC
  Administered 2021-06-11: 40 mg via ORAL
  Filled 2021-06-11: qty 1

## 2021-06-11 MED ORDER — CARVEDILOL 12.5 MG PO TABS
12.5000 mg | ORAL_TABLET | Freq: Once | ORAL | Status: AC
  Administered 2021-06-11: 12.5 mg via ORAL

## 2021-06-11 MED ORDER — SERTRALINE HCL 50 MG PO TABS
50.0000 mg | ORAL_TABLET | Freq: Every day | ORAL | 0 refills | Status: DC
  Filled 2021-06-11 – 2021-08-02 (×2): qty 30, 30d supply, fill #0

## 2021-06-11 MED ORDER — FUROSEMIDE 40 MG PO TABS
40.0000 mg | ORAL_TABLET | Freq: Every day | ORAL | 0 refills | Status: DC
  Filled 2021-06-11: qty 30, 30d supply, fill #0

## 2021-06-11 MED ORDER — CALCIUM CARBONATE 1250 (500 CA) MG PO TABS
1.0000 | ORAL_TABLET | ORAL | Status: AC
  Administered 2021-06-11: 500 mg via ORAL
  Filled 2021-06-11: qty 1

## 2021-06-11 MED ORDER — AMLODIPINE BESYLATE 10 MG PO TABS
10.0000 mg | ORAL_TABLET | Freq: Every day | ORAL | 0 refills | Status: DC
  Filled 2021-06-11 – 2021-08-02 (×2): qty 30, 30d supply, fill #0

## 2021-06-11 MED ORDER — CARVEDILOL 12.5 MG PO TABS
ORAL_TABLET | ORAL | 0 refills | Status: DC
  Filled 2021-06-11 – 2021-08-02 (×2): qty 60, 30d supply, fill #0

## 2021-06-11 MED ORDER — CALCITRIOL 0.25 MCG PO CAPS
0.2500 ug | ORAL_CAPSULE | Freq: Every day | ORAL | 0 refills | Status: DC
  Filled 2021-06-11: qty 30, 30d supply, fill #0

## 2021-06-11 MED ORDER — CALCIUM GLUCONATE-NACL 1-0.675 GM/50ML-% IV SOLN
1.0000 g | Freq: Once | INTRAVENOUS | Status: DC
Start: 2021-06-11 — End: 2021-06-11

## 2021-06-11 MED ORDER — ATORVASTATIN CALCIUM 40 MG PO TABS
40.0000 mg | ORAL_TABLET | Freq: Every day | ORAL | 0 refills | Status: DC
  Filled 2021-06-11: qty 30, 30d supply, fill #0

## 2021-06-11 MED ORDER — CALCIUM GLUCONATE 500 MG PO TABS
1.0000 | ORAL_TABLET | Freq: Three times a day (TID) | ORAL | 0 refills | Status: DC
  Filled 2021-06-11: qty 15, 5d supply, fill #0

## 2021-06-11 NOTE — ED Provider Notes (Signed)
[facility] EMERGENCY DEPARTMENT Provider Note   CSN: [id number] Arrival date & time: 06/10/21  2240     History  Chief Complaint  Patient presents with   Leg Swelling    [name] is a 51 y.o. male.  The history is provided by the patient.  He has history of diabetes, hypertension, hyperlipidemia, end-stage renal disease on hemodialysis, secondary hyperparathyroidism, heart failure and comes in because of ongoing problems with leg swelling.  He states that his legs have been swollen ever since he went on dialysis in October.  He is now also noting some scrotal swelling.  Swelling has not changed significantly.  He also has noted gradually progressive visual loss over the last 2 months, he is scheduled to see an eye doctor in the next week.  He has been going to dialysis and states that he has been going for full sessions, but does not know why fluid has not been adequately drained off.  He does admit that he is drinking more fluids than he should.  He has been having problems with his balance and has been falling.  He does note some shortness of breath with exertion, not at rest.  He has been out of his medications for the last week.  He states that he had been at a shelter and his medications were left there.  He had been admitted to a hospital in [facility] and has not been on any medications except for his insulin since he left the hospital.   Home Medications Prior to Admission medications   Medication Sig Start Date End Date Taking? Authorizing Provider  calcium gluconate 500 MG tablet Take 1 tablet (500 mg total) by mouth 3 (three) times daily. 0/34/91  Yes Delora Fuel, MD  amLODipine (NORVASC) 10 MG tablet Take 1 tablet by mouth once a day 7/91/50   Delora Fuel, MD  atorvastatin (LIPITOR) 40 MG tablet Take 1 tablet (40 mg total) by mouth daily. 5/69/79   Delora Fuel, MD  Blood Glucose Monitoring Suppl (ACCU-CHEK GUIDE) w/Device KIT Use as directed 11/29/20    [name], MD  Blood Glucose Monitoring Suppl (TRUE METRIX METER) w/Device KIT Use to measure blood sugar twice a day 02/22/20   [name], MD  calcitRIOL (ROCALTROL) 0.25 MCG capsule Take 1 capsule (0.25 mcg total) by mouth daily. 4/80/16   Delora Fuel, MD  carvedilol (COREG) 12.5 MG tablet Take 1 tablet (12.49m total) by mouth two times a day with a meal. 15/53/74  GDelora Fuel MD  furosemide (LASIX) 40 MG tablet Take 1 tablet (40 mg total) by mouth daily. 18/27/0728/67/54 GDelora Fuel MD  glucose blood test strip Use to check fasting blood sugar once daily. 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 Insulin dependent 11/29/20   KAnnita Brod MD  hydrALAZINE (APRESOLINE) 25 MG tablet Take 1 tablet by mouth three times a day 14/92/01  GDelora Fuel MD  insulin aspart (NOVOLOG FLEXPEN) 100 UNIT/ML FlexPen Inject 5 Units into the skin 2 (two) times daily with a meal. 02/26/21   [name], DSilver Huguenin MD  insulin glargine (LANTUS) 100 UNIT/ML Solostar Pen Inject 10 Units into the skin daily. May substitute with any other long-acting formulary. 05/13/21   PDarliss Cheney MD  Insulin Pen Needle 32G X 4 MM MISC Use with insulin pens 02/26/21   [name], DSilver Huguenin MD  Methoxy PEG-Epoetin Beta (MIRCERA IJ) Mircera 03/08/21 03/07/22  [provider]  sertraline (ZOLOFT) 50 MG tablet  Take 1 tablet (50 mg total) by mouth daily. 6/37/85 8/85/02  Delora Fuel, MD  TRUEplus Lancets 28G MISC USE TO MEASURE BLOOD SUGAR TWICE A DAY 11/29/20 11/29/21  [name], MD  gabapentin (NEURONTIN) 300 MG capsule Take 1 capsule (300 mg total) by mouth 3 (three) times daily. Patient not taking: Reported on 07/27/2019 07/29/18 08/30/19  [name], FNP  insulin detemir (LEVEMIR FLEXTOUCH) 100 UNIT/ML FlexPen Inject 35 Units into the skin daily. Patient not taking: Reported on 02/17/2021 11/29/20 02/24/21  [name], MD  sildenafil (VIAGRA) 25 MG tablet Take 1 tablet (25 mg total) by mouth as needed for  erectile dysfunction. for erectile dysfunction. Patient not taking: Reported on 08/11/2020 10/13/19 11/27/20  [name], FNP  sitaGLIPtin (JANUVIA) 25 MG tablet Take 1 tablet (25 mg total) by mouth daily. Patient not taking: Reported on 08/11/2020 02/22/20 11/27/20  [name], MD      Allergies    Sulfa antibiotics, Bactrim [sulfamethoxazole-trimethoprim], Cephalexin, and Other    Review of Systems   Review of Systems  All other systems reviewed and are negative.  Physical Exam Updated Vital Signs BP (!) 194/104    Pulse 93    Temp 99.2 F (37.3 C) (Oral)    Resp 18    Ht 5' 7"  (1.702 m)    Wt 75 kg    SpO2 97%    BMI 25.90 kg/m  Physical Exam Vitals and nursing note reviewed.  51 year old male, resting comfortably and in no acute distress. Vital signs are significant for elevated blood pressure. Oxygen saturation is 97%, which is normal. Head is normocephalic and atraumatic. PERRLA, EOMI. Oropharynx is clear.  Unable to visualize fundi. Neck is nontender and supple without adenopathy or JVD. Back is nontender and there is no CVA tenderness.  There is 2+ presacral edema. Lungs are clear without rales, wheezes, or rhonchi. Chest is nontender. Heart has regular rate and rhythm without murmur. Abdomen is soft, flat, nontender without masses or hepatosplenomegaly and peristalsis is normoactive. Extremities have 3+ edema, full range of motion is present. Skin is warm and dry without rash. Neurologic: Mental status is normal, cranial nerves are intact, moves all extremities equally.  ED Results / Procedures / Treatments   Labs (all labs ordered are listed, but only abnormal results are displayed) Labs Reviewed  BASIC METABOLIC PANEL - Abnormal; Notable for the following components:      Result Value   Glucose, Bld 174 (*)    BUN 45 (*)    Creatinine, Ser 7.12 (*)    Calcium 5.8 (*)    GFR, Estimated 9 (*)    All other components within normal limits  CBC WITH  DIFFERENTIAL/PLATELET - Abnormal; Notable for the following components:   RBC 3.69 (*)    Hemoglobin 9.8 (*)    HCT 32.5 (*)    All other components within normal limits    EKG EKG Interpretation  Date/Time:  Sunday June 10 2021 23:40:57 EST Ventricular Rate:  90 PR Interval:  134 QRS Duration: 142 QT Interval:  450 QTC Calculation: 550 R Axis:   -52 Text Interpretation: Normal sinus rhythm Possible Left atrial enlargement Left axis deviation Left bundle branch block Abnormal ECG When compared with ECG of 11-May-2021 18:12, No significant change was found Confirmed by Delora Fuel ([id number]) on 06/11/2021 12:18:51 AM  Procedures Procedures    Medications Ordered in ED Medications  calcium carbonate (OS-CAL - dosed in mg of elemental  calcium) tablet 500 mg of elemental calcium (has no administration in time range)  amLODipine (NORVASC) tablet 10 mg (has no administration in time range)  atorvastatin (LIPITOR) tablet 40 mg (has no administration in time range)  calcitRIOL (ROCALTROL) capsule 0.25 mcg (has no administration in time range)  carvedilol (COREG) tablet 12.5 mg (has no administration in time range)  furosemide (LASIX) tablet 40 mg (has no administration in time range)  hydrALAZINE (APRESOLINE) tablet 25 mg (has no administration in time range)    ED Course/ Medical Decision Making/ A&P                           Medical Decision Making Risk OTC drugs. Prescription drug management.   Peripheral edema and patient with end-stage renal disease on dialysis.  He will need to have the fluid overload addressed through dialysis, no acute intervention needed.  He is maintaining adequate oxygen saturation.  Vision loss is concerning, probable diabetic retinopathy.  He already has an appointment with an eye specialist which she will need to keep.  No acute intervention needed.  Blood pressure is significantly elevated.  This is at least partly related to being off of his  medications for the past week.  Screening labs have been obtained and showed anemia which is slightly worse than it had been, but hemoglobin still in an acceptable range.  Calcium is very low at 5.8.  However, he has always run a low calcium, usually in the range of 7.  He had been on Rocaltrol, but has not been taking it because he has not been taking his medications for the past week.  On review of record at recent hospitalization in [facility], he has had calciums in the similar range.  He is not symptomatic with his hypocalcemia, negative [place] sign.  He is given a dose of oral calcium and is given a dose of oral Rocaltrol.  He is given initial doses of all of his blood pressure medications.  He is given new prescriptions for all of his medications which he states he will be able to get filled today.  In addition, because of his low calcium, he is given a dose of oral calcium to take for the next 5 days.        Final Clinical Impression(s) / ED Diagnoses Final diagnoses:  Peripheral edema  Hypocalcemia  End-stage renal disease on hemodialysis (HCC)  Elevated blood pressure reading with diagnosis of hypertension  Anemia associated with chronic renal failure  Vision loss    Rx / DC Orders ED Discharge Orders          Ordered    amLODipine (NORVASC) 10 MG tablet  Daily        06/11/21 0239    atorvastatin (LIPITOR) 40 MG tablet  Daily        06/11/21 0239    calcitRIOL (ROCALTROL) 0.25 MCG capsule  Daily        06/11/21 0239    carvedilol (COREG) 12.5 MG tablet        06/11/21 0239    furosemide (LASIX) 40 MG tablet  Daily        06/11/21 0239    hydrALAZINE (APRESOLINE) 25 MG tablet  3 times daily        06/11/21 0239    sertraline (ZOLOFT) 50 MG tablet  Daily        06/11/21 0239    calcium gluconate 500 MG tablet  3 times daily        06/11/21 8206              Delora Fuel, MD 01/56/15 [phone]

## 2021-06-11 NOTE — ED Provider Notes (Signed)
Given a critical patient has a calcium of 5.8, he is a dialysis patient presenting with leg pain, EKG shows nonspecific changes, will recommend patient brought back for further evaluation.   Marcello Fennel, PA-C 76/22/63 [id number]    Delora Fuel, MD 56/25/63 [phone]

## 2021-06-11 NOTE — Discharge Instructions (Addendum)
You will have to work with your dialysis doctor to make sure that enough fluid is being drawn off at each dialysis session.  Make sure to keep your appointment with the eye doctor.  Do not allow yourself to run out of any of your medications.  If you do run out of medications and cannot get refills through your primary care provider, do not hesitate to return to the emergency department.  Return if you are having any problems.

## 2021-06-12 ENCOUNTER — Other Ambulatory Visit: Payer: Self-pay

## 2021-06-12 ENCOUNTER — Encounter (HOSPITAL_COMMUNITY): Payer: Self-pay | Admitting: Emergency Medicine

## 2021-06-12 ENCOUNTER — Emergency Department (HOSPITAL_COMMUNITY)
Admission: EM | Admit: 2021-06-12 | Discharge: 2021-06-13 | Disposition: A | Discharge status: Home / Self Care | Payer: Medicaid Other | Attending: Emergency Medicine | Admitting: Emergency Medicine

## 2021-06-12 ENCOUNTER — Emergency Department (HOSPITAL_COMMUNITY): Payer: Medicaid Other

## 2021-06-12 DIAGNOSIS — Z20822 Contact with and (suspected) exposure to covid-19: Secondary | ICD-10-CM | POA: Diagnosis not present

## 2021-06-12 DIAGNOSIS — E1122 Type 2 diabetes mellitus with diabetic chronic kidney disease: Secondary | ICD-10-CM | POA: Diagnosis not present

## 2021-06-12 DIAGNOSIS — N189 Chronic kidney disease, unspecified: Secondary | ICD-10-CM

## 2021-06-12 DIAGNOSIS — N186 End stage renal disease: Secondary | ICD-10-CM

## 2021-06-12 DIAGNOSIS — Z992 Dependence on renal dialysis: Secondary | ICD-10-CM | POA: Diagnosis not present

## 2021-06-12 DIAGNOSIS — M7989 Other specified soft tissue disorders: Secondary | ICD-10-CM | POA: Diagnosis not present

## 2021-06-12 DIAGNOSIS — D631 Anemia in chronic kidney disease: Secondary | ICD-10-CM | POA: Insufficient documentation

## 2021-06-12 DIAGNOSIS — Z794 Long term (current) use of insulin: Secondary | ICD-10-CM | POA: Insufficient documentation

## 2021-06-12 DIAGNOSIS — I120 Hypertensive chronic kidney disease with stage 5 chronic kidney disease or end stage renal disease: Secondary | ICD-10-CM | POA: Diagnosis not present

## 2021-06-12 DIAGNOSIS — Z91148 Patient's other noncompliance with medication regimen for other reason: Secondary | ICD-10-CM

## 2021-06-12 DIAGNOSIS — Z79899 Other long term (current) drug therapy: Secondary | ICD-10-CM | POA: Diagnosis not present

## 2021-06-12 DIAGNOSIS — Z9114 Patient's other noncompliance with medication regimen: Secondary | ICD-10-CM | POA: Insufficient documentation

## 2021-06-12 DIAGNOSIS — I998 Other disorder of circulatory system: Secondary | ICD-10-CM

## 2021-06-12 DIAGNOSIS — R600 Localized edema: Secondary | ICD-10-CM

## 2021-06-12 DIAGNOSIS — R0602 Shortness of breath: Secondary | ICD-10-CM | POA: Diagnosis present

## 2021-06-12 DIAGNOSIS — R609 Edema, unspecified: Secondary | ICD-10-CM

## 2021-06-12 NOTE — ED Triage Notes (Signed)
Patient [place] with [place] missed his hemodialysis today reports SOB .

## 2021-06-12 NOTE — ED Provider Triage Note (Signed)
Emergency Medicine Provider Triage Evaluation Note  HOYTE ZIEBELL , a 51 y.o. male  was evaluated in triage.  Pt complains of shortness of breath denies chest pain. Is very unwilling to answer questions.  States that his last dialysis session was a partial dialysis session Saturday.  Unwilling to tell me whether he has missed prior dialysis sessions. Denies any leg swelling.  Denies any fevers.  Review of Systems  Positive: SOB Negative: Chest pain, fever  Physical Exam  BP (!) 182/96 (BP Location: Left Arm)    Pulse 93    Temp 99 F (37.2 C) (Oral)    Resp (!) 22    SpO2 98%  Gen:   Awake, no distress. Resp:  Normal effort. MSK:   Moves extremities without difficulty. Other:  Wrapped in blanket pulls blanket tightly around him when I attempt to listen to his lungs.  I do not hear any crackles to his blanket and shirt.  Medical Decision Making  Medically screening exam initiated at 11:38 PM.  Appropriate orders placed.  ANDRES BANTZ was informed that the remainder of the evaluation will be completed by another provider, this initial triage assessment does not replace that evaluation, and the importance of remaining in the ED until their evaluation is complete.  Labs x-ray EKG   Pati Gallo [place], [place] 06/12/21 2339

## 2021-06-13 ENCOUNTER — Emergency Department (HOSPITAL_COMMUNITY)
Admission: EM | Admit: 2021-06-13 | Discharge: 2021-06-14 | Disposition: A | Discharge status: Home / Self Care | Payer: Medicaid Other | Source: Home / Self Care

## 2021-06-13 ENCOUNTER — Encounter (HOSPITAL_COMMUNITY): Payer: Self-pay

## 2021-06-13 DIAGNOSIS — Z5321 Procedure and treatment not carried out due to patient leaving prior to being seen by health care provider: Secondary | ICD-10-CM | POA: Insufficient documentation

## 2021-06-13 DIAGNOSIS — M7989 Other specified soft tissue disorders: Secondary | ICD-10-CM | POA: Insufficient documentation

## 2021-06-13 LAB — BASIC METABOLIC PANEL
Anion gap: 11 (ref 5–15)
BUN: 52 mg/dL — ABNORMAL HIGH (ref 6–20)
CO2: 23 mmol/L (ref 22–32)
Calcium: 5.7 mg/dL — CL (ref 8.9–10.3)
Chloride: 103 mmol/L (ref 98–111)
Creatinine, Ser: 7.75 mg/dL — ABNORMAL HIGH (ref 0.61–1.24)
GFR, Estimated: 8 mL/min — ABNORMAL LOW (ref >60)
Glucose, Bld: 207 mg/dL — ABNORMAL HIGH (ref 70–99)
Potassium: 4.1 mmol/L (ref 3.5–5.1)
Sodium: 137 mmol/L (ref 135–145)

## 2021-06-13 LAB — CBC WITH DIFFERENTIAL/PLATELET
Abs Immature Granulocytes: 0.04 10*3/uL (ref 0.00–0.07)
Basophils Absolute: 0 10*3/uL (ref 0.0–0.1)
Basophils Relative: 1 %
Eosinophils Absolute: 0.2 10*3/uL (ref 0.0–0.5)
Eosinophils Relative: 3 %
HCT: 30.2 % — ABNORMAL LOW (ref 39.0–52.0)
Hemoglobin: 9.1 g/dL — ABNORMAL LOW (ref 13.0–17.0)
Immature Granulocytes: 1 %
Lymphocytes Relative: 17 %
Lymphs Abs: 1.4 10*3/uL (ref 0.7–4.0)
MCH: 26.5 pg (ref 26.0–34.0)
MCHC: 30.1 g/dL (ref 30.0–36.0)
MCV: 87.8 fL (ref 80.0–100.0)
Monocytes Absolute: 1 10*3/uL (ref 0.1–1.0)
Monocytes Relative: 12 %
Neutro Abs: 5.5 10*3/uL (ref 1.7–7.7)
Neutrophils Relative %: 66 %
Platelets: 355 10*3/uL (ref 150–400)
RBC: 3.44 MIL/uL — ABNORMAL LOW (ref 4.22–5.81)
RDW: 15.1 % (ref 11.5–15.5)
WBC: 8.2 10*3/uL (ref 4.0–10.5)
nRBC: 0 % (ref 0.0–0.2)

## 2021-06-13 LAB — RESP PANEL BY RT-PCR (FLU A&B, COVID) ARPGX2
Influenza A by PCR: NEGATIVE
Influenza B by PCR: NEGATIVE
SARS Coronavirus 2 by RT PCR: NEGATIVE

## 2021-06-13 MED ORDER — AMLODIPINE BESYLATE 5 MG PO TABS
10.0000 mg | ORAL_TABLET | Freq: Once | ORAL | Status: AC
  Administered 2021-06-13: 03:00:00 10 mg via ORAL
  Filled 2021-06-13: qty 2

## 2021-06-13 MED ORDER — HYDRALAZINE HCL 25 MG PO TABS
25.0000 mg | ORAL_TABLET | Freq: Once | ORAL | Status: AC
  Administered 2021-06-13: 03:00:00 25 mg via ORAL
  Filled 2021-06-13: qty 1

## 2021-06-13 MED ORDER — CARVEDILOL 12.5 MG PO TABS: 12.5000 mg | ORAL_TABLET | Freq: Two times a day (BID) | ORAL | Status: DC

## 2021-06-13 MED ORDER — CALCITRIOL 0.25 MCG PO CAPS
0.2500 ug | ORAL_CAPSULE | Freq: Once | ORAL | Status: AC
  Administered 2021-06-13: 03:00:00 0.25 ug via ORAL
  Filled 2021-06-13: qty 1

## 2021-06-13 NOTE — Discharge Instructions (Addendum)
Go to your dialysis center at noon today.  They will do your dialysis today, even though it is not your scheduled today.  He will not feel better unless you go to dialysis to get the fluid pulled off.  Please make sure to pick up the prescriptions which were given to you at your ED visit 2 days ago.  It is very important that you take your medications as prescribed.

## 2021-06-13 NOTE — ED Provider Notes (Signed)
[facility] EMERGENCY DEPARTMENT Provider Note   CSN: [id number] Arrival date & time: 06/12/21  2243     History  Chief Complaint  Patient presents with   Missed HD /SOB     [name] is a 51 y.o. male.  The history is provided by the patient.  He has history of hypertension, diabetes, hyperlipidemia, end-stage renal disease on hemodialysis, secondary hyperparathyroidism, heart failure and comes in because of ongoing problems with leg swelling.  He was supposed to be dialyzed today, but missed his dialysis.  He denies any chest pain, heaviness, tightness, pressure.  There is slight associated shortness of breath.  He denies cough and denies fever or chills.  Of note, he was seen in the emergency department 2 days ago and given new prescriptions for his medications, but he has not gotten them filled and therefore is not taking his medications.   Home Medications Prior to Admission medications   Medication Sig Start Date End Date Taking? Authorizing Provider  amLODipine (NORVASC) 10 MG tablet Take 1 tablet by mouth once a day 5/42/70   Delora Fuel, MD  atorvastatin (LIPITOR) 40 MG tablet Take 1 tablet (40 mg total) by mouth daily. 11/10/74   Delora Fuel, MD  Blood Glucose Monitoring Suppl (ACCU-CHEK GUIDE) w/Device KIT Use as directed 11/29/20   [name], MD  Blood Glucose Monitoring Suppl (TRUE METRIX METER) w/Device KIT Use to measure blood sugar twice a day 02/22/20   [name], MD  calcitRIOL (ROCALTROL) 0.25 MCG capsule Take 1 capsule (0.25 mcg total) by mouth daily. 2/83/15   Delora Fuel, MD  calcium gluconate 500 MG tablet Take 1 tablet (500 mg total) by mouth 3 (three) times daily. 1/76/16   Delora Fuel, MD  carvedilol (COREG) 12.5 MG tablet Take 1 tablet (12.43m total) by mouth two times a day with a meal. 10/73/71  GDelora Fuel MD  furosemide (LASIX) 40 MG tablet Take 1 tablet (40 mg total) by mouth daily. 10/62/6924/85/46 GDelora Fuel MD  glucose blood test strip Use to check fasting blood sugar once daily. 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 Insulin dependent 11/29/20   KAnnita Brod MD  hydrALAZINE (APRESOLINE) 25 MG tablet Take 1 tablet by mouth three times a day 12/70/35  GDelora Fuel MD  insulin aspart (NOVOLOG FLEXPEN) 100 UNIT/ML FlexPen Inject 5 Units into the skin 2 (two) times daily with a meal. 02/26/21   [name], DSilver Huguenin MD  insulin glargine (LANTUS) 100 UNIT/ML Solostar Pen Inject 10 Units into the skin daily. May substitute with any other long-acting formulary. 05/13/21   PDarliss Cheney MD  Insulin Pen Needle 32G X 4 MM MISC Use with insulin pens 02/26/21   [name], DSilver Huguenin MD  Methoxy PEG-Epoetin Beta (MIRCERA IJ) Mircera 03/08/21 03/07/22  [provider]  sertraline (ZOLOFT) 50 MG tablet Take 1 tablet (50 mg total) by mouth daily. 02/26/3820/82/99 GDelora Fuel MD  TRUEplus Lancets 28G MISC USE TO MEASURE BLOOD SUGAR TWICE A DAY 11/29/20 11/29/21  KAnnita Brod MD  gabapentin (NEURONTIN) 300 MG capsule Take 1 capsule (300 mg total) by mouth 3 (three) times daily. Patient not taking: Reported on 07/27/2019 07/29/18 08/30/19  SAzzie Glatter FNP  insulin detemir (LEVEMIR FLEXTOUCH) 100 UNIT/ML FlexPen Inject 35 Units into the skin daily. Patient not taking: Reported on 02/17/2021 11/29/20 02/24/21  KAnnita Brod MD  sildenafil (VIAGRA) 25 MG tablet Take 1 tablet (25 mg total)  by mouth as needed for erectile dysfunction. for erectile dysfunction. Patient not taking: Reported on 08/11/2020 10/13/19 11/27/20  [name], FNP  sitaGLIPtin (JANUVIA) 25 MG tablet Take 1 tablet (25 mg total) by mouth daily. Patient not taking: Reported on 08/11/2020 02/22/20 11/27/20  [name], MD      Allergies    Sulfa antibiotics, Bactrim [sulfamethoxazole-trimethoprim], Cephalexin, and Other    Review of Systems   Review of Systems  All other systems reviewed and are negative.  Physical  Exam Updated Vital Signs BP (!) 182/96 (BP Location: Left Arm)    Pulse 93    Temp 99 F (37.2 C) (Oral)    Resp (!) 22    SpO2 98%  Physical Exam Vitals and nursing note reviewed.  51 year old male, resting comfortably and in no acute distress. Vital signs are significant for elevated blood pressure and slightly elevated respiratory rate. Oxygen saturation is 98%, which is normal. Head is normocephalic and atraumatic. PERRLA, EOMI. Oropharynx is clear. Neck is nontender and supple without adenopathy or JVD. Back is nontender and there is no CVA tenderness. Lungs have few bibasilar rales.  There are no wheezes or rhonchi. Chest is nontender. Heart has regular rate and rhythm without murmur. Abdomen is soft, flat, nontender. Extremities have 3+ edema.  AV fistula is present in the right arm with thrill present. Skin is warm and dry without rash. Neurologic: Mental status is normal, cranial nerves are intact, moves all extremities equally.  ED Results / Procedures / Treatments   Labs (all labs ordered are listed, but only abnormal results are displayed) Labs Reviewed  CBC WITH DIFFERENTIAL/PLATELET - Abnormal; Notable for the following components:      Result Value   RBC 3.44 (*)    Hemoglobin 9.1 (*)    HCT 30.2 (*)    All other components within normal limits  RESP PANEL BY RT-PCR (FLU A&B, COVID) [id number]  BASIC METABOLIC PANEL    EKG None  Radiology DG Chest 2 View  Result Date: 06/13/2021 CLINICAL DATA:  Dyspnea, missed dialysis EXAM: CHEST - 2 VIEW COMPARISON:  05/27/2021 FINDINGS: The lungs are symmetrically well expanded. No pneumothorax or pleural effusion. Cardiomegaly is stable. Left internal jugular hemodialysis catheter tip noted within the right atrium. The central pulmonary vasculature is engorged, mild perihilar interstitial infiltrate is seen and there is pulmonary vascular redistribution of the lung apices in keeping with changes of mild volume overload or early  cardiogenic failure. No acute bone abnormality. IMPRESSION: Stable cardiomegaly.  Mild cardiogenic failure. Electronically Signed   By: Fidela Salisbury M.D.   On: 06/13/2021 00:14    Procedures Procedures    Medications Ordered in ED Medications - No data to display  ED Course/ Medical Decision Making/ A&P                           Medical Decision Making Risk Prescription drug management.   Peripheral edema secondary to end-stage renal disease and patient has been noncompliant with his dialysis.  Elevated blood pressure and patient has been noncompliant with his antihypertensives.  Labs show stable anemia, basic metabolic panel pending at this time.  I have discussed the case with Dr. Posey Pronto of nephrology service to see if patient can be set up for outpatient dialysis later today.  Dr. Posey Pronto states he will call back after 6 AM when dialysis center opens.  Old records are reviewed, showing ED visit 2  days ago with virtually identical presentation at which time new prescriptions were given which have not been filled.  He is given doses of all of his medications.  Dr. Posey Pronto has called back.  His dialysis center can take him at noon.  Patient is advised of this.  In the meantime, blood pressure has come down.  Patient is encouraged to get the prescriptions filled which were prescribed 2 days ago.  Importance of medication compliance was stressed.        Final Clinical Impression(s) / ED Diagnoses Final diagnoses:  Peripheral edema  End-stage renal disease on hemodialysis ([place])  Noncompliance with medication regimen  Hypocalcemia  Anemia associated with chronic renal failure  Poorly controlled blood pressure    Rx / DC Orders ED Discharge Orders     None         Delora Fuel, MD 09/81/19 [phone]

## 2021-06-13 NOTE — ED Provider Triage Note (Addendum)
Emergency Medicine Provider Triage Evaluation Note  [name] , a 51 y.o. male  was evaluated in triage.  Pt complains of missed dialysis.  No treatment since Saturday.  Usual schedule is Tues, [place], Sat.  States he feels a little SOB.  Review of Systems  Positive: Missed HD, SOB Negative: fever  Physical Exam  BP (!) 169/89 (BP Location: Left Arm)    Pulse 93    Temp 99.3 F (37.4 C) (Oral)    Resp 15    SpO2 93%   Gen:   Awake, no distress   Resp:  Normal effort  MSK:   Moves extremities without difficulty  Other:    Medical Decision Making  Medically screening exam initiated at 11:57 PM.  Appropriate orders placed.  [name] was informed that the remainder of the evaluation will be completed by another provider, this initial triage assessment does not replace that evaluation, and the importance of remaining in the ED until their evaluation is complete.  SOB after missing last 2 dialysis sessions.  O2 sta 93% in triage but no obvious distress noted.  Will check EKG, labs, CXR.  2:10 AM Refused CXR.   Larene Pickett, PA-C 06/14/21 0007    Larene Pickett, PA-C 06/14/21 0210

## 2021-06-13 NOTE — ED Notes (Signed)
Patient verbalizes understanding of discharge instructions. Opportunity for questioning and answers were provided. Armband removed by staff, pt discharged from ED via wheelchair to lobby to return with friend.

## 2021-06-13 NOTE — ED Triage Notes (Signed)
[place] [facility] [place] states he has not had dialysis since Sat and has lower leg swelling

## 2021-06-14 LAB — BASIC METABOLIC PANEL
Anion gap: 12 (ref 5–15)
BUN: 60 mg/dL — ABNORMAL HIGH (ref 6–20)
CO2: 22 mmol/L (ref 22–32)
Calcium: 5.6 mg/dL — CL (ref 8.9–10.3)
Chloride: 104 mmol/L (ref 98–111)
Creatinine, Ser: 8.25 mg/dL — ABNORMAL HIGH (ref 0.61–1.24)
GFR, Estimated: 7 mL/min — ABNORMAL LOW (ref >60)
Glucose, Bld: 237 mg/dL — ABNORMAL HIGH (ref 70–99)
Potassium: 4.1 mmol/L (ref 3.5–5.1)
Sodium: 138 mmol/L (ref 135–145)

## 2021-06-14 LAB — CBC WITH DIFFERENTIAL/PLATELET
Abs Immature Granulocytes: 0.03 10*3/uL (ref 0.00–0.07)
Basophils Absolute: 0 10*3/uL (ref 0.0–0.1)
Basophils Relative: 0 %
Eosinophils Absolute: 0.3 10*3/uL (ref 0.0–0.5)
Eosinophils Relative: 3 %
HCT: 29.2 % — ABNORMAL LOW (ref 39.0–52.0)
Hemoglobin: 8.7 g/dL — ABNORMAL LOW (ref 13.0–17.0)
Immature Granulocytes: 0 %
Lymphocytes Relative: 13 %
Lymphs Abs: 1.1 10*3/uL (ref 0.7–4.0)
MCH: 26.4 pg (ref 26.0–34.0)
MCHC: 29.8 g/dL — ABNORMAL LOW (ref 30.0–36.0)
MCV: 88.5 fL (ref 80.0–100.0)
Monocytes Absolute: 1.1 10*3/uL — ABNORMAL HIGH (ref 0.1–1.0)
Monocytes Relative: 13 %
Neutro Abs: 6 10*3/uL (ref 1.7–7.7)
Neutrophils Relative %: 71 %
Platelets: 327 10*3/uL (ref 150–400)
RBC: 3.3 MIL/uL — ABNORMAL LOW (ref 4.22–5.81)
RDW: 15.2 % (ref 11.5–15.5)
WBC: 8.6 10*3/uL (ref 4.0–10.5)
nRBC: 0 % (ref 0.0–0.2)

## 2021-06-14 NOTE — ED Notes (Addendum)
Pt woke up but refused vitals and was seen going outside but has not returned

## 2021-06-15 ENCOUNTER — Other Ambulatory Visit: Payer: Self-pay

## 2021-06-17 ENCOUNTER — Other Ambulatory Visit: Payer: Self-pay

## 2021-06-17 ENCOUNTER — Encounter (HOSPITAL_COMMUNITY): Payer: Self-pay

## 2021-06-17 ENCOUNTER — Emergency Department (HOSPITAL_COMMUNITY)
Admission: EM | Admit: 2021-06-17 | Discharge: 2021-06-18 | Disposition: A | Discharge status: Home / Self Care | Payer: Medicaid Other | Attending: Emergency Medicine | Admitting: Emergency Medicine

## 2021-06-17 DIAGNOSIS — Z992 Dependence on renal dialysis: Secondary | ICD-10-CM | POA: Diagnosis not present

## 2021-06-17 DIAGNOSIS — W06XXXA Fall from bed, initial encounter: Secondary | ICD-10-CM | POA: Insufficient documentation

## 2021-06-17 DIAGNOSIS — E1122 Type 2 diabetes mellitus with diabetic chronic kidney disease: Secondary | ICD-10-CM | POA: Diagnosis not present

## 2021-06-17 DIAGNOSIS — R079 Chest pain, unspecified: Secondary | ICD-10-CM

## 2021-06-17 DIAGNOSIS — Z794 Long term (current) use of insulin: Secondary | ICD-10-CM | POA: Insufficient documentation

## 2021-06-17 DIAGNOSIS — R0789 Other chest pain: Secondary | ICD-10-CM | POA: Insufficient documentation

## 2021-06-17 DIAGNOSIS — R600 Localized edema: Secondary | ICD-10-CM | POA: Insufficient documentation

## 2021-06-17 DIAGNOSIS — I132 Hypertensive heart and chronic kidney disease with heart failure and with stage 5 chronic kidney disease, or end stage renal disease: Secondary | ICD-10-CM | POA: Insufficient documentation

## 2021-06-17 DIAGNOSIS — N186 End stage renal disease: Secondary | ICD-10-CM | POA: Insufficient documentation

## 2021-06-17 DIAGNOSIS — Z7984 Long term (current) use of oral hypoglycemic drugs: Secondary | ICD-10-CM | POA: Insufficient documentation

## 2021-06-17 DIAGNOSIS — I509 Heart failure, unspecified: Secondary | ICD-10-CM | POA: Diagnosis not present

## 2021-06-17 DIAGNOSIS — Z79899 Other long term (current) drug therapy: Secondary | ICD-10-CM | POA: Diagnosis not present

## 2021-06-17 LAB — COMPREHENSIVE METABOLIC PANEL
ALT: 42 U/L (ref 0–44)
AST: 42 U/L — ABNORMAL HIGH (ref 15–41)
Albumin: 2.3 g/dL — ABNORMAL LOW (ref 3.5–5.0)
Alkaline Phosphatase: 176 U/L — ABNORMAL HIGH (ref 38–126)
Anion gap: 12 (ref 5–15)
BUN: 45 mg/dL — ABNORMAL HIGH (ref 6–20)
CO2: 25 mmol/L (ref 22–32)
Calcium: 6.1 mg/dL — CL (ref 8.9–10.3)
Chloride: 97 mmol/L — ABNORMAL LOW (ref 98–111)
Creatinine, Ser: 7.7 mg/dL — ABNORMAL HIGH (ref 0.61–1.24)
GFR, Estimated: 8 mL/min — ABNORMAL LOW (ref >60)
Glucose, Bld: 257 mg/dL — ABNORMAL HIGH (ref 70–99)
Potassium: 3.7 mmol/L (ref 3.5–5.1)
Sodium: 134 mmol/L — ABNORMAL LOW (ref 135–145)
Total Bilirubin: 0.5 mg/dL (ref 0.3–1.2)
Total Protein: 6.5 g/dL (ref 6.5–8.1)

## 2021-06-17 LAB — CBC
HCT: 31.5 % — ABNORMAL LOW (ref 39.0–52.0)
Hemoglobin: 9.7 g/dL — ABNORMAL LOW (ref 13.0–17.0)
MCH: 26.6 pg (ref 26.0–34.0)
MCHC: 30.8 g/dL (ref 30.0–36.0)
MCV: 86.5 fL (ref 80.0–100.0)
Platelets: 343 10*3/uL (ref 150–400)
RBC: 3.64 MIL/uL — ABNORMAL LOW (ref 4.22–5.81)
RDW: 15.3 % (ref 11.5–15.5)
WBC: 8.4 10*3/uL (ref 4.0–10.5)
nRBC: 0 % (ref 0.0–0.2)

## 2021-06-17 LAB — TROPONIN I (HIGH SENSITIVITY): Troponin I (High Sensitivity): 131 ng/L (ref <18)

## 2021-06-17 NOTE — ED Notes (Signed)
Lab called with critical values: Calcium 6.1, Troponin 131. Agricultural consultant and MD notified.

## 2021-06-17 NOTE — ED Notes (Signed)
Pt refusing Xrays.

## 2021-06-17 NOTE — ED Notes (Signed)
Patient transported to MRI 

## 2021-06-17 NOTE — ED Provider Notes (Signed)
[place] EMERGENCY DEPARTMENT Provider Note   CSN: [id number] Arrival date & time: 06/17/21  2059     History  Chief Complaint  Patient presents with   Chest Pain    [name] is a 51 y.o. male with history of end-stage renal disease on dialysis, HTN, diabetes mellitus type 2, chronic anemia, secondary hyperparathyroidism, left bundle branch block and history of CHF presenting today due to chest pain around the area of his dialysis port.  Patient states he fell out of bed to his left shoulder area and chest, 3 hours later began having tenderness around the area.  Denied bleeding or discharge from the site or any signs of infection including fever.  Patient missed his Tuesday and Saturday dialysis appointments over this past week, but was able to make his Thursday appointment.  States this is due to lack of transportation availability for him.  Also unable to financially afford his medications including his calcium gluconate and his insulin.  He has not take any since Tuesday.  Patient was given aspirin by the paramedics and said this helped relieve some of the pain around the site.  Pain described as 8 out of 10.  Denies chills, fever, dizziness, loss of consciousness.  Also endorses lower leg edema and blurry vision, but states this is at his baseline.  The history is provided by the patient and medical records.  Chest Pain Associated symptoms: no abdominal pain, no back pain, no cough, no fever, no palpitations, no shortness of breath and no vomiting      Home Medications Prior to Admission medications   Medication Sig Start Date End Date Taking? Authorizing Provider  amLODipine (NORVASC) 10 MG tablet Take 1 tablet by mouth once a day 0/92/33   Delora Fuel, MD  atorvastatin (LIPITOR) 40 MG tablet Take 1 tablet (40 mg total) by mouth daily. 0/07/62   Delora Fuel, MD  Blood Glucose Monitoring Suppl (ACCU-CHEK GUIDE) w/Device KIT Use as directed 11/29/20    [name], MD  Blood Glucose Monitoring Suppl (TRUE METRIX METER) w/Device KIT Use to measure blood sugar twice a day 02/22/20   [name], MD  calcitRIOL (ROCALTROL) 0.25 MCG capsule Take 1 capsule (0.25 mcg total) by mouth daily. 2/63/33   Delora Fuel, MD  calcium gluconate 500 MG tablet Take 1 tablet (500 mg total) by mouth 3 (three) times daily. 5/45/62   Delora Fuel, MD  carvedilol (COREG) 12.5 MG tablet Take 1 tablet (12.67m total) by mouth two times a day with a meal. 15/63/89  GDelora Fuel MD  furosemide (LASIX) 40 MG tablet Take 1 tablet (40 mg total) by mouth daily. 13/73/4228/76/81 GDelora Fuel MD  glucose blood test strip Use to check fasting blood sugar once daily. 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 Insulin dependent 11/29/20   KAnnita Brod MD  hydrALAZINE (APRESOLINE) 25 MG tablet Take 1 tablet by mouth three times a day 11/57/26  GDelora Fuel MD  insulin aspart (NOVOLOG FLEXPEN) 100 UNIT/ML FlexPen Inject 5 Units into the skin 2 (two) times daily with a meal. 02/26/21   [name], DSilver Huguenin MD  insulin glargine (LANTUS) 100 UNIT/ML Solostar Pen Inject 10 Units into the skin daily. May substitute with any other long-acting formulary. 05/13/21   PDarliss Cheney MD  Insulin Pen Needle 32G X 4 MM MISC Use with insulin pens 02/26/21   [name], DSilver Huguenin MD  Methoxy PEG-Epoetin Beta (MIRCERA IJ) Mircera 03/08/21 03/07/22  [provider]  sertraline (ZOLOFT) 50 MG tablet Take 1 tablet (50 mg total) by mouth daily. 9/60/45 08/27/79  Delora Fuel, MD  TRUEplus Lancets 28G MISC USE TO MEASURE BLOOD SUGAR TWICE A DAY 11/29/20 11/29/21  [name], MD  gabapentin (NEURONTIN) 300 MG capsule Take 1 capsule (300 mg total) by mouth 3 (three) times daily. Patient not taking: Reported on 07/27/2019 07/29/18 08/30/19  [name], FNP  insulin detemir (LEVEMIR FLEXTOUCH) 100 UNIT/ML FlexPen Inject 35 Units into the skin daily. Patient not taking: Reported on 02/17/2021 11/29/20  02/24/21  [name], MD  sildenafil (VIAGRA) 25 MG tablet Take 1 tablet (25 mg total) by mouth as needed for erectile dysfunction. for erectile dysfunction. Patient not taking: Reported on 08/11/2020 10/13/19 11/27/20  [name], FNP  sitaGLIPtin (JANUVIA) 25 MG tablet Take 1 tablet (25 mg total) by mouth daily. Patient not taking: Reported on 08/11/2020 02/22/20 11/27/20  [name], MD      Allergies    Sulfa antibiotics, Bactrim [sulfamethoxazole-trimethoprim], Cephalexin, and Other    Review of Systems   Review of Systems  Constitutional:  Negative for chills and fever.  HENT:  Negative for ear pain and sore throat.   Eyes:  Negative for pain and visual disturbance.  Respiratory:  Negative for cough and shortness of breath.   Cardiovascular:  Positive for chest pain and leg swelling. Negative for palpitations.       At the location of the dialysis port Leg swelling at baseline  Gastrointestinal:  Negative for abdominal pain and vomiting.  Genitourinary:  Negative for dysuria and hematuria.  Musculoskeletal:  Negative for arthralgias, back pain and neck stiffness.  Skin:  Negative for color change and rash.  Neurological:  Negative for seizures and syncope.       Blurry vision - at baseline  All other systems reviewed and are negative.  Physical Exam Updated Vital Signs BP (!) 192/90    Pulse 100    Temp 99.3 F (37.4 C) (Oral)    Resp 18    Ht _0  (1.702 m)    Wt 75 kg    SpO2 98%    BMI 25.90 kg/m  Physical Exam Vitals and nursing note reviewed.  Constitutional:      General: He is not in acute distress.    Appearance: He is well-developed.  HENT:     Head: Normocephalic and atraumatic.  Eyes:     General: Lids are normal. Vision grossly intact.     Extraocular Movements:     Right eye: Normal extraocular motion and no nystagmus.     Left eye: Normal extraocular motion and no nystagmus.     Conjunctiva/sclera: Conjunctivae normal.  Neck:      Meningeal: Brudzinski's sign absent.  Cardiovascular:     Rate and Rhythm: Normal rate and regular rhythm.     Pulses: Normal pulses.          Radial pulses are 2+ on the right side and 2+ on the left side.     Heart sounds: Normal heart sounds. No murmur heard. Pulmonary:     Effort: Pulmonary effort is normal. No respiratory distress.     Breath sounds: Normal breath sounds.  Chest:     Chest wall: Tenderness present.       Comments: Tenderness at location of port site Port site negative for erythema, purulence, or other signs of infection  Abdominal:     General: Bowel sounds are  normal.     Palpations: Abdomen is soft.     Tenderness: There is no abdominal tenderness.  Musculoskeletal:        General: No swelling.     Left shoulder: Normal.     Cervical back: Neck supple. No rigidity. Normal range of motion.     Right lower leg: 3+ Pitting Edema present.     Left lower leg: 3+ Pitting Edema present.     Comments: Left shoulder joint, clavicle, and humerus normal ROM and non-TTP without signs of trauma  Skin:    General: Skin is warm and dry.     Capillary Refill: Capillary refill takes less than 2 seconds.  Neurological:     Mental Status: He is alert and oriented to person, place, and time.     GCS: GCS eye subscore is 4. GCS verbal subscore is 5. GCS motor subscore is 6.  Psychiatric:        Mood and Affect: Mood normal.    ED Results / Procedures / Treatments   Labs (all labs ordered are listed, but only abnormal results are displayed) Labs Reviewed  CBC - Abnormal; Notable for the following components:      Result Value   RBC 3.64 (*)    Hemoglobin 9.7 (*)    HCT 31.5 (*)    All other components within normal limits  COMPREHENSIVE METABOLIC PANEL - Abnormal; Notable for the following components:   Sodium 134 (*)    Chloride 97 (*)    Glucose, Bld 257 (*)    BUN 45 (*)    Creatinine, Ser 7.70 (*)    Calcium 6.1 (*)    Albumin 2.3 (*)    AST 42 (*)     Alkaline Phosphatase 176 (*)    GFR, Estimated 8 (*)    All other components within normal limits  TROPONIN I (HIGH SENSITIVITY) - Abnormal; Notable for the following components:   Troponin I (High Sensitivity) 131 (*)    All other components within normal limits  TROPONIN I (HIGH SENSITIVITY) - Abnormal; Notable for the following components:   Troponin I (High Sensitivity) 127 (*)    All other components within normal limits    EKG EKG Interpretation  Date/Time:  Sunday June 17 2021 21:10:39 EST Ventricular Rate:  100 PR Interval:  130 QRS Duration: 142 QT Interval:  424 QTC Calculation: 546 R Axis:   -72 Text Interpretation: Normal sinus rhythm Possible Left atrial enlargement Left axis deviation Left bundle branch block Abnormal ECG When compared with ECG of 14-Jun-2021 00:07, No significant change since last tracing Confirmed by Quintella Reichert [phone]) on 06/17/2021 10:54:55 PM  Radiology DG CHEST PORT 1 VIEW  Result Date: 06/18/2021 CLINICAL DATA:  Chest pain.  Fell last night. EXAM: PORTABLE CHEST 1 VIEW COMPARISON:  PA Lat 06/12/2021. FINDINGS: Moderate to severe cardiomegaly. Central vascular engorgement is again seen with flow cephalization, mild generalized interstitial edema and minimal pleural effusions. There is increased opacity today in the right mid lung below the level of the horizontal fissure, most likely indicating an airspace infiltrate in the superior segment of the right lower lobe. This could be due to asymmetric edema or pneumonia. No other focal pulmonary infiltrate or interval changes are seen. Left IJ dialysis catheter tip remains in the upper right atrium. Stable mediastinal configuration and thoracic cage. IMPRESSION: There is a stable pattern of vascular engorgement and mild interstitial edema consistent with CHF or fluid overload, but there is also a  new patchy airspace infiltrate in the right mid lung probably in the superior segment lower lobe. This could  be due to superimposed pneumonia or asymmetric edema. No other new abnormality. Electronically Signed   By: Telford Nab M.D.   On: 06/18/2021 01:00    Procedures Procedures    Medications Ordered in ED Medications  calcium carbonate (OS-CAL - dosed in mg of elemental calcium) tablet 1,250 mg (1,250 mg Oral Given 06/18/21 0112)    ED Course/ Medical Decision Making/ A&P                           Medical Decision Making Amount and/or Complexity of Data Reviewed Labs: ordered. Radiology: ordered.  Risk OTC drugs.   51 year old male presents to the ED for concern of chest pain around his dialysis port site.  This involves an extensive number of treatment options, and is a complaint that carries with it a high risk of complications and morbidity.  The differential diagnosis includes port site malalignment, pulmonary contusion, pneumonia, acute coronary event.  Comorbidities that complicate the patient evaluation include end stage renal disease on dialysis, diabetes mellitus type 2, HTN, chronic anemia, secondary hyperparathyroidism, history of congestive heart failure, left bundle branch block  Additional history obtained from internal/external records available via epic  I ordered, and personally interpreted labs.  The pertinent results include: Troponin of 131 and then 127, which is trending downwards and likely due to troponin leak seeing the patient is on dialysis and lacks physical exam or history supporting acute cardiac condition.  Calcium is 6.1, patient is hypocalcemic and has not been taking his calcium p.o. outpatient as he should.  Calcium gluconate provided in the ED today to help correct this.  Hemoglobin of 9.7 and hematocrit of 31.5 consistent with chronic anemia levels from past labs.  I ordered imaging studies including chest x-ray.  I independently visualized and interpreted imaging which showed possible evidence of pulmonary contusion or pneumonia, but considering the  patient's physical exam and history this is unlikely.  Patient's lungs were very clear on physical exam and patient has had no symptoms of respiratory illness or fever or history suggesting otherwise.  Reevaluation of the patient after these medicines showed that the patient had mild improvement.  Patient's dressing was also missing from the port site on his chest.  Port site was cleaned and redressed.  ED Course Patient is continually noncompliant with his medications and attending dialysis that he states is due to financial reasons.  He has been seen several times in the ER for lower extremity edema, and states that the edema seen on physical exam is consistent and at his baseline.  No shortness of breath or recent illness.  Believe getting him in touch with the social worker in order to coordinate transportation to dialysis and financial support may help him with his compliance.  His calcium may be chronically low due to his noncompliance and end-stage renal disease, as his physical exam does not show altered mental status, abdominal pain, or other symptoms expected with acute hypocalcemia.  Social Determinants of Health include Medicaid and poor access to healthcare  Dispostion: I discussed the patient and their case with my attending, Dr. Ralene Bathe, who agreed with the proposed treatment course.  After consideration of the diagnostic results and the patients response to treatment, I feel that the patent would benefit from assistance of social worker on an outpatient basis to help coordinate transportation  from him to and from his dialysis appointments and to help him with medications.  Discussed course of treatment thoroughly with the patient and he demonstrated understanding.  Patient in agreement and has no further questions.        Final Clinical Impression(s) / ED Diagnoses Final diagnoses:  Other chest pain  End stage renal disease on dialysis [facility])    Rx / DC Orders ED Discharge Orders      None         Candace Cruise 99/14/44 [id number]    Quintella Reichert, MD 06/19/21 1549

## 2021-06-17 NOTE — ED Triage Notes (Signed)
Pt presents to the ED from home with [facility] with complaints of CP. Pt states he fell off the bed last night thinks he landed on his dialysis catheter in his left chest. Pt missed Dialysis on Tuesday and Saturday, went on Thursday. Pt states he didn't have a ride to get there.

## 2021-06-18 ENCOUNTER — Emergency Department (HOSPITAL_COMMUNITY): Payer: Medicaid Other

## 2021-06-18 LAB — TROPONIN I (HIGH SENSITIVITY): Troponin I (High Sensitivity): 127 ng/L (ref <18)

## 2021-06-18 MED ORDER — CALCIUM CARBONATE 1250 (500 CA) MG PO TABS
1250.0000 mg | ORAL_TABLET | Freq: Three times a day (TID) | ORAL | Status: DC
  Administered 2021-06-18: 1250 mg via ORAL
  Filled 2021-06-18: qty 1

## 2021-06-18 NOTE — Discharge Instructions (Addendum)
A social worker will be in touch with you in the next few days in order to help coordinate your access/transportation to and from dialysis and with your medications.  Return to the ED for new or worsening symptoms including but not limited to increased chest pain, infection of port site, shortness of breath, loss of consciousness, seizure, syncope.

## 2021-06-20 ENCOUNTER — Other Ambulatory Visit: Payer: Self-pay

## 2021-06-20 ENCOUNTER — Emergency Department (HOSPITAL_COMMUNITY): Payer: Medicaid Other

## 2021-06-20 ENCOUNTER — Emergency Department (HOSPITAL_COMMUNITY)
Admission: EM | Admit: 2021-06-20 | Discharge: 2021-06-20 | Disposition: A | Discharge status: Home / Self Care | Payer: Medicaid Other | Attending: Student | Admitting: Student

## 2021-06-20 DIAGNOSIS — R0602 Shortness of breath: Secondary | ICD-10-CM | POA: Insufficient documentation

## 2021-06-20 DIAGNOSIS — R072 Precordial pain: Secondary | ICD-10-CM | POA: Insufficient documentation

## 2021-06-20 DIAGNOSIS — Z5321 Procedure and treatment not carried out due to patient leaving prior to being seen by health care provider: Secondary | ICD-10-CM | POA: Insufficient documentation

## 2021-06-20 LAB — CBC
HCT: 33.3 % — ABNORMAL LOW (ref 39.0–52.0)
Hemoglobin: 9.9 g/dL — ABNORMAL LOW (ref 13.0–17.0)
MCH: 26.2 pg (ref 26.0–34.0)
MCHC: 29.7 g/dL — ABNORMAL LOW (ref 30.0–36.0)
MCV: 88.1 fL (ref 80.0–100.0)
Platelets: 389 10*3/uL (ref 150–400)
RBC: 3.78 MIL/uL — ABNORMAL LOW (ref 4.22–5.81)
RDW: 15.4 % (ref 11.5–15.5)
WBC: 9.5 10*3/uL (ref 4.0–10.5)
nRBC: 0.2 % (ref 0.0–0.2)

## 2021-06-20 LAB — BASIC METABOLIC PANEL
Anion gap: 18 — ABNORMAL HIGH (ref 5–15)
BUN: 39 mg/dL — ABNORMAL HIGH (ref 6–20)
CO2: 18 mmol/L — ABNORMAL LOW (ref 22–32)
Calcium: 7.5 mg/dL — ABNORMAL LOW (ref 8.9–10.3)
Chloride: 98 mmol/L (ref 98–111)
Creatinine, Ser: 6.14 mg/dL — ABNORMAL HIGH (ref 0.61–1.24)
GFR, Estimated: 11 mL/min — ABNORMAL LOW (ref >60)
Glucose, Bld: 142 mg/dL — ABNORMAL HIGH (ref 70–99)
Potassium: 4.7 mmol/L (ref 3.5–5.1)
Sodium: 134 mmol/L — ABNORMAL LOW (ref 135–145)

## 2021-06-20 LAB — TROPONIN I (HIGH SENSITIVITY): Troponin I (High Sensitivity): 251 ng/L (ref <18)

## 2021-06-20 NOTE — ED Notes (Signed)
Pt called for vitals with no response x1 

## 2021-06-20 NOTE — ED Triage Notes (Signed)
EMS stated, chest pain, pt would not talk about nothing else. Here 2 days ago. He can talk and walk. 324 ASA

## 2021-06-20 NOTE — ED Notes (Signed)
Unable to find pt in waiting room. 

## 2021-06-20 NOTE — ED Triage Notes (Signed)
[place]  251, it was the same 2 days ago

## 2021-06-20 NOTE — ED Provider Triage Note (Signed)
Emergency Medicine Provider Triage Evaluation Note  [name] , a 51 y.o. male  was evaluated in triage.  Pt complains of CP, midsternal with SHOB, onset yesterday when he finished dialysis, similar pain last week. Came today as the pain had not gone away.   Review of Systems  Positive: CP, SHOB Negative: Vomiting, fever  Physical Exam  There were no vitals taken for this visit. Gen:   Awake, no distress   Resp:  Normal effort  MSK:   Moves extremities without difficulty  Other:    Medical Decision Making  Medically screening exam initiated at 7:56 AM.  Appropriate orders placed.  [name] was informed that the remainder of the evaluation will be completed by another provider, this initial triage assessment does not replace that evaluation, and the importance of remaining in the ED until their evaluation is complete.     Douglas Learn, PA-C 06/20/21 [phone]

## 2021-06-20 NOTE — ED Notes (Signed)
Called pt for vitals x3 but was unable to locate pt.

## 2021-07-06 ENCOUNTER — Encounter (HOSPITAL_COMMUNITY): Payer: Self-pay

## 2021-07-06 ENCOUNTER — Emergency Department (HOSPITAL_COMMUNITY)
Admission: EM | Admit: 2021-07-06 | Discharge: 2021-07-06 | Disposition: A | Discharge status: Home / Self Care | Payer: Medicaid Other | Attending: Emergency Medicine | Admitting: Emergency Medicine

## 2021-07-06 ENCOUNTER — Emergency Department (HOSPITAL_COMMUNITY): Payer: Medicaid Other

## 2021-07-06 ENCOUNTER — Other Ambulatory Visit: Payer: Self-pay

## 2021-07-06 DIAGNOSIS — Z79899 Other long term (current) drug therapy: Secondary | ICD-10-CM | POA: Diagnosis not present

## 2021-07-06 DIAGNOSIS — E1122 Type 2 diabetes mellitus with diabetic chronic kidney disease: Secondary | ICD-10-CM | POA: Insufficient documentation

## 2021-07-06 DIAGNOSIS — I120 Hypertensive chronic kidney disease with stage 5 chronic kidney disease or end stage renal disease: Secondary | ICD-10-CM | POA: Insufficient documentation

## 2021-07-06 DIAGNOSIS — R2243 Localized swelling, mass and lump, lower limb, bilateral: Secondary | ICD-10-CM | POA: Diagnosis not present

## 2021-07-06 DIAGNOSIS — J810 Acute pulmonary edema: Secondary | ICD-10-CM | POA: Diagnosis not present

## 2021-07-06 DIAGNOSIS — R1030 Lower abdominal pain, unspecified: Secondary | ICD-10-CM | POA: Insufficient documentation

## 2021-07-06 DIAGNOSIS — R0602 Shortness of breath: Secondary | ICD-10-CM | POA: Diagnosis present

## 2021-07-06 DIAGNOSIS — N186 End stage renal disease: Secondary | ICD-10-CM | POA: Insufficient documentation

## 2021-07-06 DIAGNOSIS — Z794 Long term (current) use of insulin: Secondary | ICD-10-CM | POA: Insufficient documentation

## 2021-07-06 LAB — CBC WITH DIFFERENTIAL/PLATELET
Abs Immature Granulocytes: 0.02 10*3/uL (ref 0.00–0.07)
Basophils Absolute: 0 10*3/uL (ref 0.0–0.1)
Basophils Relative: 1 %
Eosinophils Absolute: 0.1 10*3/uL (ref 0.0–0.5)
Eosinophils Relative: 3 %
HCT: 34.6 % — ABNORMAL LOW (ref 39.0–52.0)
Hemoglobin: 10.2 g/dL — ABNORMAL LOW (ref 13.0–17.0)
Immature Granulocytes: 0 %
Lymphocytes Relative: 18 %
Lymphs Abs: 1 10*3/uL (ref 0.7–4.0)
MCH: 25.6 pg — ABNORMAL LOW (ref 26.0–34.0)
MCHC: 29.5 g/dL — ABNORMAL LOW (ref 30.0–36.0)
MCV: 86.9 fL (ref 80.0–100.0)
Monocytes Absolute: 0.5 10*3/uL (ref 0.1–1.0)
Monocytes Relative: 8 %
Neutro Abs: 3.8 10*3/uL (ref 1.7–7.7)
Neutrophils Relative %: 70 %
Platelets: 380 10*3/uL (ref 150–400)
RBC: 3.98 MIL/uL — ABNORMAL LOW (ref 4.22–5.81)
RDW: 16.3 % — ABNORMAL HIGH (ref 11.5–15.5)
WBC: 5.4 10*3/uL (ref 4.0–10.5)
nRBC: 0 % (ref 0.0–0.2)

## 2021-07-06 LAB — COMPREHENSIVE METABOLIC PANEL
ALT: 59 U/L — ABNORMAL HIGH (ref 0–44)
AST: 33 U/L (ref 15–41)
Albumin: 2.5 g/dL — ABNORMAL LOW (ref 3.5–5.0)
Alkaline Phosphatase: 279 U/L — ABNORMAL HIGH (ref 38–126)
Anion gap: 13 (ref 5–15)
BUN: 51 mg/dL — ABNORMAL HIGH (ref 6–20)
CO2: 25 mmol/L (ref 22–32)
Calcium: 6.8 mg/dL — ABNORMAL LOW (ref 8.9–10.3)
Chloride: 99 mmol/L (ref 98–111)
Creatinine, Ser: 6.84 mg/dL — ABNORMAL HIGH (ref 0.61–1.24)
GFR, Estimated: 9 mL/min — ABNORMAL LOW (ref >60)
Glucose, Bld: 215 mg/dL — ABNORMAL HIGH (ref 70–99)
Potassium: 4.2 mmol/L (ref 3.5–5.1)
Sodium: 137 mmol/L (ref 135–145)
Total Bilirubin: 0.6 mg/dL (ref 0.3–1.2)
Total Protein: 6.8 g/dL (ref 6.5–8.1)

## 2021-07-06 MED ORDER — AMLODIPINE BESYLATE 5 MG PO TABS
10.0000 mg | ORAL_TABLET | Freq: Once | ORAL | Status: AC
  Administered 2021-07-06: 10 mg via ORAL
  Filled 2021-07-06: qty 2

## 2021-07-06 MED ORDER — CARVEDILOL 12.5 MG PO TABS
12.5000 mg | ORAL_TABLET | Freq: Once | ORAL | Status: AC
  Administered 2021-07-06: 12.5 mg via ORAL
  Filled 2021-07-06: qty 1

## 2021-07-06 MED ORDER — HYDRALAZINE HCL 25 MG PO TABS
25.0000 mg | ORAL_TABLET | Freq: Once | ORAL | Status: AC
  Administered 2021-07-06: 25 mg via ORAL
  Filled 2021-07-06: qty 1

## 2021-07-06 NOTE — ED Triage Notes (Signed)
Pt presents to ED with EMS with c/o [facility]. Pt currently does hemodialysis (Tu/Thurs/Sat). Missed treatment on yesterday, started having SHOB last night. Pt initially 90% on RA, EMS placed pt on 4L and O2 was 100%. Pt has not had medication in last month. Hx of HTN.

## 2021-07-06 NOTE — Progress Notes (Signed)
Contacted by ED provider inquiring if pt can receive out-pt HD at clinic today. Contacted [facility] and spoke to Bruin, Agricultural consultant, who states clinic does not have any availability to complete HD for pt today. Pt can resume regular schedule tomorrow. Update provided to ED provider.   Melven Sartorius Renal Navigator [phone]

## 2021-07-06 NOTE — ED Notes (Signed)
Pt is on chronic O2 of 2L [place]. O2 decreases to 88% on RA. Pt is constantly reminded to keep O2 on. Continues to pull O2 off. Pt given further education.

## 2021-07-06 NOTE — ED Notes (Signed)
Pt refuses IV insertion at this time. States he would rather have a butterfly needle for labs.

## 2021-07-06 NOTE — ED Notes (Signed)
Pt ambulatory with steady gait to restroom for bowel movement.

## 2021-07-06 NOTE — ED Provider Notes (Signed)
[facility]ER[facility] DEPARTMENT Provider Note   CSN: [id number] Arrival date & time: 07/06/21  8889     History  Chief Complaint  Patient presents with   Shortness of Breath    TYLEE NEWBY is a 51 y.o. male.  Patient is a 51 year old male with a history of type 2 diabetes, hypertension, hyperlipidemia, end-stage renal disease on dialysis Tuesday Thursday Saturday who is presenting today with complaints of shortness of breath.  Patient reports that he wears 2 L of oxygen chronically but noticed last night he was starting to feel short of breath.  Shortness of breath is exacerbated by exertion or laying down when he is trying to sleep.  He also complains of some mild lower abdominal pain that started yesterday about the same time.  He denies any chest pain, fever, nausea vomiting or diarrhea.  He does still make urine but denies any dysuria.  He reports he has swelling in his legs all the time and it never really seems to go away.  He last had dialysis on Tuesday but missed his dialysis yesterday because he had stuff going on.  He does report having dialysis 3 times last week.  Patient also reports he has not had his medications in about amount.  History also obtained by EMS and they reported when they got to his house today he was not wearing oxygen and on room air O2 sat was 90%.  No other acute issues in route.  The history is provided by the patient.  Shortness of Breath     Home Medications Prior to Admission medications   Medication Sig Start Date End Date Taking? Authorizing Provider  insulin aspart (NOVOLOG FLEXPEN) 100 UNIT/ML FlexPen Inject 5 Units into the skin 2 (two) times daily with a meal. Patient taking differently: Inject 5 Units into the skin 2 (two) times daily as needed for high blood sugar (CBG >200). 02/26/21  Yes [name], [name], MD  insulin glargine (LANTUS) 100 UNIT/ML Solostar Pen Inject 10 Units into the skin daily. May substitute with  any other long-acting formulary. Patient taking differently: Inject 10 Units into the skin daily as needed (CBG >200). May substitute with any other long-acting formulary. 05/13/21  Yes [name], MD  amLODipine (NORVASC) 10 MG tablet Take 1 tablet by mouth once a day Patient not taking: Reported on 1/69/4503 8/88/28   Delora Fuel, MD  atorvastatin (LIPITOR) 40 MG tablet Take 1 tablet (40 mg total) by mouth daily. Patient not taking: Reported on 0/07/4915 02/02/04   Delora Fuel, MD  Blood Glucose Monitoring Suppl (ACCU-CHEK GUIDE) w/Device KIT Use as directed 11/29/20   [name], MD  Blood Glucose Monitoring Suppl (TRUE METRIX METER) w/Device KIT Use to measure blood sugar twice a day 02/22/20   [name], MD  calcitRIOL (ROCALTROL) 0.25 MCG capsule Take 1 capsule (0.25 mcg total) by mouth daily. Patient not taking: Reported on 6/97/9480 1/65/53   Delora Fuel, MD  calcium gluconate 500 MG tablet Take 1 tablet (500 mg total) by mouth 3 (three) times daily. Patient not taking: Reported on 7/48/2707 8/67/54   Delora Fuel, MD  carvedilol (COREG) 12.5 MG tablet Take 1 tablet (12.37m total) by mouth two times a day with a meal. Patient not taking: Reported on 24/92/010017/12/19  GDelora Fuel MD  furosemide (LASIX) 40 MG tablet Take 1 tablet (40 mg total) by mouth daily. Patient not taking: Reported on 07/06/2021 17/58/8322/54/98 GDelora Fuel MD  glucose blood test strip Use to check fasting blood sugar once daily. 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 Insulin dependent 11/29/20   [name], MD  hydrALAZINE (APRESOLINE) 25 MG tablet Take 1 tablet by mouth three times a day Patient not taking: Reported on 6/78/9381 0/17/51   Delora Fuel, MD  Insulin Pen Needle 32G X 4 MM MISC Use with insulin pens 02/26/21   [name], [name], MD  sertraline (ZOLOFT) 50 MG tablet Take 1 tablet (50 mg total) by mouth daily. Patient not taking: Reported on 07/06/2021 0/25/85 2/77/82  Delora Fuel, MD   TRUEplus Lancets 28G MISC USE TO MEASURE BLOOD SUGAR TWICE A DAY 11/29/20 11/29/21  [name], MD  gabapentin (NEURONTIN) 300 MG capsule Take 1 capsule (300 mg total) by mouth 3 (three) times daily. Patient not taking: Reported on 07/27/2019 07/29/18 08/30/19  [name], FNP  insulin detemir (LEVEMIR FLEXTOUCH) 100 UNIT/ML FlexPen Inject 35 Units into the skin daily. Patient not taking: Reported on 02/17/2021 11/29/20 02/24/21  [name], MD  sildenafil (VIAGRA) 25 MG tablet Take 1 tablet (25 mg total) by mouth as needed for erectile dysfunction. for erectile dysfunction. Patient not taking: Reported on 08/11/2020 10/13/19 11/27/20  [name], FNP  sitaGLIPtin (JANUVIA) 25 MG tablet Take 1 tablet (25 mg total) by mouth daily. Patient not taking: Reported on 08/11/2020 02/22/20 11/27/20  [name], MD      Allergies    Sulfa antibiotics, Cephalexin, Other, and Bactrim [sulfamethoxazole-trimethoprim]    Review of Systems   Review of Systems  Respiratory:  Positive for shortness of breath.    Physical Exam Updated Vital Signs BP (!) 175/100    Pulse 88    Temp 98.3 F (36.8 C) (Oral)    Resp 19    Ht _0  (1.702 m)    Wt 74.8 kg    SpO2 100%    BMI 25.84 kg/m  Physical Exam Vitals and nursing note reviewed.  Constitutional:      General: He is not in acute distress.    Appearance: He is well-developed.  HENT:     Head: Normocephalic and atraumatic.     Mouth/Throat:     Mouth: Mucous membranes are moist.  Eyes:     Conjunctiva/sclera: Conjunctivae normal.     Pupils: Pupils are equal, round, and reactive to light.  Cardiovascular:     Rate and Rhythm: Normal rate and regular rhythm.     Pulses: Normal pulses.     Heart sounds: No murmur heard. Pulmonary:     Effort: Pulmonary effort is normal. No respiratory distress.     Breath sounds: Normal breath sounds. No wheezing or rales.  Abdominal:     General: There is no distension.     Palpations:  Abdomen is soft.     Tenderness: There is no abdominal tenderness. There is no guarding or rebound.  Musculoskeletal:        General: No tenderness. Normal range of motion.     Cervical back: Normal range of motion and neck supple.     Right lower leg: Edema present.     Left lower leg: Edema present.     Comments: 2+ pitting edema up to the knee bilaterally  Skin:    General: Skin is warm and dry.     Findings: No erythema or rash.  Neurological:     Mental Status: He is alert and oriented to person, place, and time. Mental status is at  baseline.  Psychiatric:        Mood and Affect: Mood normal.        Behavior: Behavior normal.    ED Results / Procedures / Treatments   Labs (all labs ordered are listed, but only abnormal results are displayed) Labs Reviewed  CBC WITH DIFFERENTIAL/PLATELET - Abnormal; Notable for the following components:      Result Value   RBC 3.98 (*)    Hemoglobin 10.2 (*)    HCT 34.6 (*)    MCH 25.6 (*)    MCHC 29.5 (*)    RDW 16.3 (*)    All other components within normal limits  COMPREHENSIVE METABOLIC PANEL - Abnormal; Notable for the following components:   Glucose, Bld 215 (*)    BUN 51 (*)    Creatinine, Ser 6.84 (*)    Calcium 6.8 (*)    Albumin 2.5 (*)    ALT 59 (*)    Alkaline Phosphatase 279 (*)    GFR, Estimated 9 (*)    All other components within normal limits    EKG EKG Interpretation  Date/Time:  Friday July 06 2021 09:03:03 EST Ventricular Rate:  89 PR Interval:  143 QRS Duration: 153 QT Interval:  453 QTC Calculation: 552 R Axis:   -43 Text Interpretation: Sinus rhythm Probable left atrial enlargement Left bundle branch block No significant change since last tracing Confirmed by Blanchie Dessert ([id number]) on 07/06/2021 9:17:35 AM  Radiology DG Chest Port 1 View  Result Date: 07/06/2021 CLINICAL DATA:  Shortness of breath. EXAM: PORTABLE CHEST 1 VIEW COMPARISON:  06/20/2021. FINDINGS: Left IJ dialysis catheter tip is  at the SVC RA junction. Heart is enlarged, stable. Mild interstitial prominence and indistinctness bilaterally. No pleural fluid. IMPRESSION: Mild pulmonary edema. Electronically Signed   By: Lorin Picket M.D.   On: 07/06/2021 09:23    Procedures Procedures    Medications Ordered in ED Medications  amLODipine (NORVASC) tablet 10 mg (10 mg Oral Given 07/06/21 0931)  hydrALAZINE (APRESOLINE) tablet 25 mg (25 mg Oral Given 07/06/21 0931)    ED Course/ Medical Decision Making/ A&P                           Medical Decision Making Amount and/or Complexity of Data Reviewed Labs: ordered. Radiology: ordered.  Risk Prescription drug management.   Patient is a 51 year old male with history of end-stage renal disease on dialysis multiple other medical problems with chronic medication noncompliance and intermittent noncompliance with his dialysis who is presenting with complaints of shortness of breath since last night.  His description of the shortness of breath seems most likely related to fluid overload.  Patient does have distal edema which seems to be chronic based on his evaluation of external medical records, recurrent emergency room records and the patient.  He does not appear an extremis today he is satting at 100% on his home oxygen setting.  Patient is hypertensive here but reports he has not been taking his blood pressure medication and he missed dialysis yesterday.  Patient given a dose of his amlodipine and hydralazine.  We will check his labs to ensure no new anemia, hyperkalemia or need for emergent dialysis in the hospital.  Will discuss with nephrology to see if we can get patient in to have a course of dialysis today at his center if lab work is reassuring.  11:56 AM I independently interpreted patient's EKG which shows no acute findings and  his lab work which shows a stable hemoglobin of 10, labs consistent with end-stage renal disease but a normal potassium today of 4.2.  I  independently viewed and interpreted patient's chest x-ray and he has persistent cardiomegaly but no significant fluid.  Radiology reported mild pulmonary edema.  Spoke with Melven Sartorius the renal coordinator and unfortunately they cannot get him into his dialysis center today.  He does not meet criteria for inpatient dialysis.  Findings were discussed with the patient.  Discussed that he would have to go to dialysis tomorrow.  Patient is fine with this plan however he does not have his oxygen at home.  It is at his mother's house and he has not been able to get it where he is living now at a hotel.  Patient does have transportation with scat from the hotel and does have a ticket for the bus tomorrow but reports he has no further tickets for the bus until March.  We will consult transitions of care team for further assistance and hopefully getting patient his oxygen so he can be discharged today and ensure he has transfer patient to dialysis.  Patient's blood pressure is starting to come down after receiving his blood pressure medication. Pt did not want to wait for TOC and reports he is leaving.  Off O2 sats are 97% and he will go to dialysis tomorrow.        Final Clinical Impression(s) / ED Diagnoses Final diagnoses:  Acute pulmonary edema [facility])    Rx / DC Orders ED Discharge Orders     None         Blanchie Dessert, MD 07/09/21 1115

## 2021-07-06 NOTE — Discharge Instructions (Signed)
You need to go to dialysis tomorrow to get the fluid off.  Avoid drinking fluid today and keep it to a minimum.

## 2021-07-09 ENCOUNTER — Emergency Department (HOSPITAL_COMMUNITY)
Admission: EM | Admit: 2021-07-09 | Discharge: 2021-07-09 | Disposition: A | Discharge status: Home / Self Care | Payer: Medicaid Other | Attending: Emergency Medicine | Admitting: Emergency Medicine

## 2021-07-09 DIAGNOSIS — Z794 Long term (current) use of insulin: Secondary | ICD-10-CM | POA: Diagnosis not present

## 2021-07-09 DIAGNOSIS — Z992 Dependence on renal dialysis: Secondary | ICD-10-CM | POA: Diagnosis not present

## 2021-07-09 DIAGNOSIS — E1122 Type 2 diabetes mellitus with diabetic chronic kidney disease: Secondary | ICD-10-CM | POA: Diagnosis not present

## 2021-07-09 DIAGNOSIS — R1030 Lower abdominal pain, unspecified: Secondary | ICD-10-CM | POA: Diagnosis present

## 2021-07-09 DIAGNOSIS — I120 Hypertensive chronic kidney disease with stage 5 chronic kidney disease or end stage renal disease: Secondary | ICD-10-CM | POA: Insufficient documentation

## 2021-07-09 DIAGNOSIS — Z79899 Other long term (current) drug therapy: Secondary | ICD-10-CM | POA: Diagnosis not present

## 2021-07-09 DIAGNOSIS — N186 End stage renal disease: Secondary | ICD-10-CM | POA: Insufficient documentation

## 2021-07-09 NOTE — ED Notes (Signed)
Pt left before his access was given a new/clean dressing.

## 2021-07-09 NOTE — ED Triage Notes (Signed)
Pt BIB [facility] from home d/t abd pain that has been consistent since last night with diarrhea. Hx of CHF, HTN, DM & dialysis (T/Th/S). Reports not taking any meds for 2 weeks.

## 2021-07-09 NOTE — ED Provider Notes (Signed)
[facility] EMERGENCY DEPARTMENT Provider Note   CSN: [id number] Arrival date & time: 07/09/21  9798     History  Chief Complaint  Patient presents with   Abdominal Pain    [name] is a 51 y.o. male.  HPI  Patient with medical history including type 2 diabetes, hypertension, end-stage renal disease on dialysis Tuesday Thursday Saturday chronic respiratory failure on 2 L via nasal cannula presents with complaints of lower abdominal pain and diarrhea.  Patient states pain started suddenly last night, while he was watching TV, pain remained in his lower abdomen, did not radiate, he had no associated nausea vomiting, he states that he had some diarrhea  last night but has had none today.  States he has no pain at this time, he came in today because he thought his pain would get worse but it resolved and he would like to go home.  He has no other complaints.  He is not endorsing fevers chills chest pain shortness of breath general body aches.  He received his dialysis treatments on Saturday, he is going to go follow-up with his dialysis treatment on Tuesday.  He notes that he does not take any of his blood pressure medications today.   Home Medications Prior to Admission medications   Medication Sig Start Date End Date Taking? Authorizing Provider  amLODipine (NORVASC) 10 MG tablet Take 1 tablet by mouth once a day Patient not taking: Reported on 02/08/1940 7/40/81   Delora Fuel, MD  atorvastatin (LIPITOR) 40 MG tablet Take 1 tablet (40 mg total) by mouth daily. Patient not taking: Reported on 4/48/1856 08/01/95   Delora Fuel, MD  Blood Glucose Monitoring Suppl (ACCU-CHEK GUIDE) w/Device KIT Use as directed 11/29/20   [name], MD  Blood Glucose Monitoring Suppl (TRUE METRIX METER) w/Device KIT Use to measure blood sugar twice a day 02/22/20   [name], MD  calcitRIOL (ROCALTROL) 0.25 MCG capsule Take 1 capsule (0.25 mcg total) by mouth  daily. Patient not taking: Reported on 0/26/3785 8/85/02   Delora Fuel, MD  calcium gluconate 500 MG tablet Take 1 tablet (500 mg total) by mouth 3 (three) times daily. Patient not taking: Reported on 7/74/1287 8/67/67   Delora Fuel, MD  carvedilol (COREG) 12.5 MG tablet Take 1 tablet (12.22m total) by mouth two times a day with a meal. Patient not taking: Reported on 22/09/470916/28/36  GDelora Fuel MD  furosemide (LASIX) 40 MG tablet Take 1 tablet (40 mg total) by mouth daily. Patient not taking: Reported on 07/06/2021 16/29/4726/54/65 GDelora Fuel MD  glucose blood test strip Use to check fasting blood sugar once daily. 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 Insulin dependent 11/29/20   KAnnita Brod MD  hydrALAZINE (APRESOLINE) 25 MG tablet Take 1 tablet by mouth three times a day Patient not taking: Reported on 20/35/465618/12/75  GDelora Fuel MD  insulin aspart (NOVOLOG FLEXPEN) 100 UNIT/ML FlexPen Inject 5 Units into the skin 2 (two) times daily with a meal. Patient taking differently: Inject 5 Units into the skin 2 (two) times daily as needed for high blood sugar (CBG >200). 02/26/21   [name], DSilver Huguenin MD  insulin glargine (LANTUS) 100 UNIT/ML Solostar Pen Inject 10 Units into the skin daily. May substitute with any other long-acting formulary. Patient taking differently: Inject 10 Units into the skin daily as needed (CBG >200). May substitute with any other long-acting formulary. 05/13/21   PDarliss Cheney MD  Insulin Pen  Needle 32G X 4 MM MISC Use with insulin pens 02/26/21   [name], [name], MD  sertraline (ZOLOFT) 50 MG tablet Take 1 tablet (50 mg total) by mouth daily. Patient not taking: Reported on 07/06/2021 12/28/01 1/59/45  Delora Fuel, MD  TRUEplus Lancets 28G MISC USE TO MEASURE BLOOD SUGAR TWICE A DAY 11/29/20 11/29/21  [name], MD  gabapentin (NEURONTIN) 300 MG capsule Take 1 capsule (300 mg total) by mouth 3 (three) times daily. Patient not taking: Reported on 07/27/2019  07/29/18 08/30/19  [name], FNP  insulin detemir (LEVEMIR FLEXTOUCH) 100 UNIT/ML FlexPen Inject 35 Units into the skin daily. Patient not taking: Reported on 02/17/2021 11/29/20 02/24/21  [name], MD  sildenafil (VIAGRA) 25 MG tablet Take 1 tablet (25 mg total) by mouth as needed for erectile dysfunction. for erectile dysfunction. Patient not taking: Reported on 08/11/2020 10/13/19 11/27/20  [name], FNP  sitaGLIPtin (JANUVIA) 25 MG tablet Take 1 tablet (25 mg total) by mouth daily. Patient not taking: Reported on 08/11/2020 02/22/20 11/27/20  [name], MD      Allergies    Sulfa antibiotics, Cephalexin, Other, and Bactrim [sulfamethoxazole-trimethoprim]    Review of Systems   Review of Systems  Constitutional:  Negative for chills and fever.  Respiratory:  Negative for shortness of breath.   Cardiovascular:  Negative for chest pain.  Gastrointestinal:  Negative for abdominal pain.  Neurological:  Negative for headaches.   Physical Exam Updated Vital Signs BP (!) 189/101 (BP Location: Left Arm)    Pulse 85    Temp 97.9 F (36.6 C) (Oral)    Resp 16    SpO2 100%  Physical Exam Vitals and nursing note reviewed.  Constitutional:      General: He is not in acute distress.    Appearance: He is not ill-appearing.  HENT:     Head: Normocephalic and atraumatic.     Nose: No congestion.  Eyes:     Conjunctiva/sclera: Conjunctivae normal.  Cardiovascular:     Rate and Rhythm: Normal rate and regular rhythm.     Pulses: Normal pulses.     Heart sounds: No murmur heard.   No friction rub. No gallop.  Pulmonary:     Effort: No respiratory distress.     Breath sounds: No wheezing, rhonchi or rales.     Comments: Showed no signs respiratory distress, he is nontachypneic, nonhypoxic, able to speak in full sentences, he does baseline 2 L via nasal cannula.  Patient has a noted dialysis catheter in the left upper chest, no surrounding erythema or drainage or  discharge present. Abdominal:     Palpations: Abdomen is soft.     Tenderness: There is no abdominal tenderness. There is no right CVA tenderness or left CVA tenderness.  Musculoskeletal:     Right lower leg: No edema.     Left lower leg: No edema.  Skin:    General: Skin is warm and dry.     Comments: Has a fistula on the right AC, good palpable thrill no overlying skin changes.  Neurological:     Mental Status: He is alert.     Comments: No facial asymmetry, able to follow two-step commands, no unilateral weakness present.  Psychiatric:        Mood and Affect: Mood normal.    ED Results / Procedures / Treatments   Labs (all labs ordered are listed, but only abnormal results are displayed) Labs Reviewed - No  data to display  EKG None  Radiology No results found.  Procedures Procedures    Medications Ordered in ED Medications - No data to display  ED Course/ Medical Decision Making/ A&P                           Medical Decision Making  This patient presents to the ED for concern of stomach pain since resolved, this involves an extensive number of treatment options, and is a complaint that carries with it a high risk of complications and morbidity.  The differential diagnosis includes bowel obstruction, diverticulitis,    Additional history obtained:  Additional history obtained from electronic medical record    Co morbidities that complicate the patient evaluation  Diabetes, hypertension, end-stage renal disease  Social Determinants of Health:  N/A    Lab Tests:  I Ordered, and personally interpreted labs.  The pertinent results include: N/A   Imaging Studies ordered:  I ordered imaging studies including N/A    Reevaluation:  Initial complaint was abdominal pain but this has since resolved, patient does not want any lab work and would like to go home, I offered to work him up but he declined this,  would like to come back if symptoms represent  themselves. he does asked that we change the dressing on his dialysis catheter of his left chest that he did not like how EMS did it.  It is noted that patient has an elevated blood pressure, has not endorsing any headaches change in vision paresthesia weakness upper or lower extremities, he states he did not take his blood pressure medication today.  States he is going to take them when he gets home.   Rule out I have low suspicion for emergent hemodialysis as patient has no new oxygen requirements, lung sounds are clear bilaterally, no evidence of significant volume overload present my exam.  I have low suspicion for hypertensive emergency as he is not endorsing headaches, paresthesias, weakness in the upper or lower extremities, chest pain shortness of breath.  I have low suspicion for intra-abdominal abnormality as he has a nonsurgical abdomen, he has no stomach pain at this time, no nausea or vomiting, he is afebrile nontachycardic.    Dispostion and problem list  After consideration of the diagnostic results and the patients response to treatment, I feel that the patent would benefit from discharge.  Stomach pain since resolved-unclear etiology, recommend he continue with home medications, follow-up with PCP as needed.  Given strict return precautions.            Final Clinical Impression(s) / ED Diagnoses Final diagnoses:  Lower abdominal pain    Rx / DC Orders ED Discharge Orders     None         Marcello Fennel, PA-C 07/09/21 3570    Regan Lemming, MD 07/09/21 1452

## 2021-07-09 NOTE — Discharge Instructions (Signed)
Your blood pressure is elevated today please remember to take your blood pressure medications, please do not forget to go to your dialysis treatment on Tuesday.  Come back to the emergency department if you develop chest pain, shortness of breath, severe abdominal pain, uncontrolled nausea, vomiting, diarrhea.

## 2021-07-09 NOTE — ED Notes (Signed)
Patient refused sitting on bed and putting on gown. Pt encouraged to do so for assessment, but pt declined. MD and RN are aware.

## 2021-07-17 ENCOUNTER — Other Ambulatory Visit: Payer: Self-pay

## 2021-07-19 ENCOUNTER — Other Ambulatory Visit (HOSPITAL_COMMUNITY): Payer: Self-pay | Admitting: Nephrology

## 2021-07-19 DIAGNOSIS — N186 End stage renal disease: Secondary | ICD-10-CM

## 2021-07-27 ENCOUNTER — Other Ambulatory Visit: Payer: Self-pay

## 2021-07-27 ENCOUNTER — Ambulatory Visit (HOSPITAL_COMMUNITY)
Admission: RE | Admit: 2021-07-27 | Discharge: 2021-07-27 | Disposition: A | Discharge status: Home / Self Care | Payer: Medicaid Other | Source: Ambulatory Visit | Attending: Nephrology | Admitting: Nephrology

## 2021-07-27 DIAGNOSIS — N186 End stage renal disease: Secondary | ICD-10-CM | POA: Insufficient documentation

## 2021-07-27 DIAGNOSIS — Z4901 Encounter for fitting and adjustment of extracorporeal dialysis catheter: Secondary | ICD-10-CM | POA: Insufficient documentation

## 2021-07-27 HISTORY — PX: IR REMOVAL TUN CV CATH W/O FL: IMG2289

## 2021-07-27 MED ORDER — LIDOCAINE HCL 1 % IJ SOLN
INTRAMUSCULAR | Status: AC
  Filled 2021-07-27: qty 20

## 2021-07-27 NOTE — Procedures (Signed)
Interventional Radiology Procedure Note ? ?Risks and benefits of removal of hemodialysis catheter were discussed with the patient including, but not limited to bleeding, hematoma, infection, damage to adjacent structures. ? ?All of the patient's questions were answered, patient is agreeable to proceed. ?Consent signed and in chart. ? ? ?PROCEDURE SUMMARY: ? ?Successful removal of tunneled dialysis catheter. ? ?No complications.  ? ?EBL = trace ? ?Please see full dictation in imaging section of Epic for procedure details. ? Pasty Spillers PA ?07/27/2021 ?1:46 PM ? ?  ?

## 2021-08-02 ENCOUNTER — Other Ambulatory Visit: Payer: Self-pay

## 2021-08-03 ENCOUNTER — Other Ambulatory Visit: Payer: Self-pay

## 2021-09-20 ENCOUNTER — Emergency Department (HOSPITAL_COMMUNITY)
Admission: EM | Admit: 2021-09-20 | Discharge: 2021-09-21 | Disposition: A | Discharge status: Home / Self Care | Payer: Medicaid Other | Attending: Emergency Medicine | Admitting: Emergency Medicine

## 2021-09-20 ENCOUNTER — Emergency Department (HOSPITAL_COMMUNITY): Payer: Medicaid Other

## 2021-09-20 ENCOUNTER — Encounter (HOSPITAL_COMMUNITY): Payer: Self-pay | Admitting: Emergency Medicine

## 2021-09-20 DIAGNOSIS — R7989 Other specified abnormal findings of blood chemistry: Secondary | ICD-10-CM | POA: Insufficient documentation

## 2021-09-20 DIAGNOSIS — E1122 Type 2 diabetes mellitus with diabetic chronic kidney disease: Secondary | ICD-10-CM | POA: Diagnosis not present

## 2021-09-20 DIAGNOSIS — I120 Hypertensive chronic kidney disease with stage 5 chronic kidney disease or end stage renal disease: Secondary | ICD-10-CM | POA: Diagnosis not present

## 2021-09-20 DIAGNOSIS — R2243 Localized swelling, mass and lump, lower limb, bilateral: Secondary | ICD-10-CM | POA: Diagnosis present

## 2021-09-20 DIAGNOSIS — R778 Other specified abnormalities of plasma proteins: Secondary | ICD-10-CM | POA: Diagnosis not present

## 2021-09-20 DIAGNOSIS — D649 Anemia, unspecified: Secondary | ICD-10-CM

## 2021-09-20 DIAGNOSIS — R748 Abnormal levels of other serum enzymes: Secondary | ICD-10-CM | POA: Insufficient documentation

## 2021-09-20 DIAGNOSIS — E8770 Fluid overload, unspecified: Secondary | ICD-10-CM | POA: Diagnosis not present

## 2021-09-20 DIAGNOSIS — Z79899 Other long term (current) drug therapy: Secondary | ICD-10-CM | POA: Insufficient documentation

## 2021-09-20 DIAGNOSIS — Z794 Long term (current) use of insulin: Secondary | ICD-10-CM | POA: Diagnosis not present

## 2021-09-20 DIAGNOSIS — Z992 Dependence on renal dialysis: Secondary | ICD-10-CM | POA: Insufficient documentation

## 2021-09-20 DIAGNOSIS — I10 Essential (primary) hypertension: Secondary | ICD-10-CM

## 2021-09-20 DIAGNOSIS — R1909 Other intra-abdominal and pelvic swelling, mass and lump: Secondary | ICD-10-CM | POA: Diagnosis not present

## 2021-09-20 DIAGNOSIS — E8779 Other fluid overload: Secondary | ICD-10-CM

## 2021-09-20 DIAGNOSIS — E8721 Acute metabolic acidosis: Secondary | ICD-10-CM | POA: Insufficient documentation

## 2021-09-20 DIAGNOSIS — N186 End stage renal disease: Secondary | ICD-10-CM

## 2021-09-20 DIAGNOSIS — E872 Acidosis, unspecified: Secondary | ICD-10-CM

## 2021-09-20 LAB — CBC WITH DIFFERENTIAL/PLATELET
Abs Immature Granulocytes: 0.06 10*3/uL (ref 0.00–0.07)
Basophils Absolute: 0 10*3/uL (ref 0.0–0.1)
Basophils Relative: 1 %
Eosinophils Absolute: 0.4 10*3/uL (ref 0.0–0.5)
Eosinophils Relative: 5 %
HCT: 34.4 % — ABNORMAL LOW (ref 39.0–52.0)
Hemoglobin: 10.9 g/dL — ABNORMAL LOW (ref 13.0–17.0)
Immature Granulocytes: 1 %
Lymphocytes Relative: 14 %
Lymphs Abs: 1 10*3/uL (ref 0.7–4.0)
MCH: 27.6 pg (ref 26.0–34.0)
MCHC: 31.7 g/dL (ref 30.0–36.0)
MCV: 87.1 fL (ref 80.0–100.0)
Monocytes Absolute: 0.8 10*3/uL (ref 0.1–1.0)
Monocytes Relative: 10 %
Neutro Abs: 5.3 10*3/uL (ref 1.7–7.7)
Neutrophils Relative %: 69 %
Platelets: 367 10*3/uL (ref 150–400)
RBC: 3.95 MIL/uL — ABNORMAL LOW (ref 4.22–5.81)
RDW: 16.3 % — ABNORMAL HIGH (ref 11.5–15.5)
WBC: 7.6 10*3/uL (ref 4.0–10.5)
nRBC: 0 % (ref 0.0–0.2)

## 2021-09-20 LAB — COMPREHENSIVE METABOLIC PANEL
ALT: 32 U/L (ref 0–44)
AST: 24 U/L (ref 15–41)
Albumin: 2.2 g/dL — ABNORMAL LOW (ref 3.5–5.0)
Alkaline Phosphatase: 286 U/L — ABNORMAL HIGH (ref 38–126)
Anion gap: 11 (ref 5–15)
BUN: 88 mg/dL — ABNORMAL HIGH (ref 6–20)
CO2: 15 mmol/L — ABNORMAL LOW (ref 22–32)
Calcium: 7.1 mg/dL — ABNORMAL LOW (ref 8.9–10.3)
Chloride: 111 mmol/L (ref 98–111)
Creatinine, Ser: 12.08 mg/dL — ABNORMAL HIGH (ref 0.61–1.24)
GFR, Estimated: 5 mL/min — ABNORMAL LOW (ref >60)
Glucose, Bld: 172 mg/dL — ABNORMAL HIGH (ref 70–99)
Potassium: 4.2 mmol/L (ref 3.5–5.1)
Sodium: 137 mmol/L (ref 135–145)
Total Bilirubin: 0.4 mg/dL (ref 0.3–1.2)
Total Protein: 6.8 g/dL (ref 6.5–8.1)

## 2021-09-20 LAB — TROPONIN I (HIGH SENSITIVITY): Troponin I (High Sensitivity): 159 ng/L (ref <18)

## 2021-09-20 LAB — LIPASE, BLOOD: Lipase: 38 U/L (ref 11–51)

## 2021-09-20 NOTE — ED Provider Triage Note (Signed)
Emergency Medicine Provider Triage Evaluation Note ? ?[name] , a 51 y.o. male  was evaluated in triage.  Pt complains of nausea.  He has not been able to go to dialysis for the past 3 weeks due to transportation issues.  He is notably hypertensive.  No significant shortness of breath.  He has not been taking his insulin for the past 2 weeks. ? ?Review of Systems  ?Positive: Nausea ?Negative: Fever ? ?Physical Exam  ?There were no vitals taken for this visit. ?Gen:   Awake, no distress   ?Resp:  Normal effort  ?MSK:   Moves extremities without difficulty  ?Other:  Facial swelling ? ?Medical Decision Making  ?Medically screening exam initiated at 8:55 PM.  Appropriate orders placed.  CRAIG IONESCU was informed that the remainder of the evaluation will be completed by another provider, this initial triage assessment does not replace that evaluation, and the importance of remaining in the ED until their evaluation is complete. ? ?Work-up initiated ?  ?Margarita Mail, PA-C ?09/20/21 2056 ? ?

## 2021-09-20 NOTE — ED Triage Notes (Signed)
Pt reported to ED with c/o HTN d/t not having dialysis for two weeks. Pt states he was unable to go d/t transportation issues. Denies chest pain or shortness of breath at this time. No distress noted.  Endorses nausea and vomiting. ? ?EMS vitals ?SBP 214 ?HR 90 ?SPO2 92% RA ?CBG 194 ?

## 2021-09-21 LAB — RENAL FUNCTION PANEL
Albumin: 2 g/dL — ABNORMAL LOW (ref 3.5–5.0)
Anion gap: 11 (ref 5–15)
BUN: 90 mg/dL — ABNORMAL HIGH (ref 6–20)
CO2: 16 mmol/L — ABNORMAL LOW (ref 22–32)
Calcium: 6.9 mg/dL — ABNORMAL LOW (ref 8.9–10.3)
Chloride: 110 mmol/L (ref 98–111)
Creatinine, Ser: 12.06 mg/dL — ABNORMAL HIGH (ref 0.61–1.24)
GFR, Estimated: 5 mL/min — ABNORMAL LOW (ref >60)
Glucose, Bld: 133 mg/dL — ABNORMAL HIGH (ref 70–99)
Phosphorus: 7.3 mg/dL — ABNORMAL HIGH (ref 2.5–4.6)
Potassium: 4.3 mmol/L (ref 3.5–5.1)
Sodium: 137 mmol/L (ref 135–145)

## 2021-09-21 LAB — CBC
HCT: 30.5 % — ABNORMAL LOW (ref 39.0–52.0)
Hemoglobin: 9.9 g/dL — ABNORMAL LOW (ref 13.0–17.0)
MCH: 28 pg (ref 26.0–34.0)
MCHC: 32.5 g/dL (ref 30.0–36.0)
MCV: 86.2 fL (ref 80.0–100.0)
Platelets: 344 10*3/uL (ref 150–400)
RBC: 3.54 MIL/uL — ABNORMAL LOW (ref 4.22–5.81)
RDW: 16.2 % — ABNORMAL HIGH (ref 11.5–15.5)
WBC: 6.6 10*3/uL (ref 4.0–10.5)
nRBC: 0 % (ref 0.0–0.2)

## 2021-09-21 LAB — TROPONIN I (HIGH SENSITIVITY): Troponin I (High Sensitivity): 162 ng/L (ref <18)

## 2021-09-21 LAB — HEPATITIS B SURFACE ANTIGEN: Hepatitis B Surface Ag: NONREACTIVE

## 2021-09-21 LAB — HEPATITIS B SURFACE ANTIBODY,QUALITATIVE: Hep B S Ab: NONREACTIVE

## 2021-09-21 MED ORDER — ASPIRIN 81 MG PO CHEW
324.0000 mg | CHEWABLE_TABLET | Freq: Once | ORAL | Status: AC
  Administered 2021-09-21: 324 mg via ORAL
  Filled 2021-09-21: qty 4

## 2021-09-21 MED ORDER — HYDRALAZINE HCL 20 MG/ML IJ SOLN
INTRAMUSCULAR | Status: AC
  Administered 2021-09-21: 10 mg via INTRAVENOUS
  Filled 2021-09-21: qty 1

## 2021-09-21 MED ORDER — HYDRALAZINE HCL 20 MG/ML IJ SOLN: 10.0000 mg | Freq: Once | INTRAMUSCULAR | Status: AC

## 2021-09-21 MED ORDER — HEPARIN SODIUM (PORCINE) 1000 UNIT/ML DIALYSIS: 2000.0000 [IU] | Freq: Once | INTRAMUSCULAR | Status: DC

## 2021-09-21 MED ORDER — CHLORHEXIDINE GLUCONATE CLOTH 2 % EX PADS: 6.0000 | MEDICATED_PAD | Freq: Every day | CUTANEOUS | Status: DC

## 2021-09-21 MED ORDER — CALCIUM CARBONATE ANTACID 500 MG PO CHEW: 400.0000 mg | CHEWABLE_TABLET | Freq: Three times a day (TID) | ORAL | Status: DC

## 2021-09-21 NOTE — Discharge Instructions (Signed)
Go to your regular dialysis session tomorrow.  You should not miss any more dialysis sessions.  You should go to every dialysis session on Tuesday Thursday and Saturday.  If you are having increasing difficulty breathing, chest pain or other new concerning symptom, please return to ER for reassessment.  Please take the blood pressure medicine as previously prescribed.  Please follow-up with your primary care doctor to discuss your blood pressure regimen. ?

## 2021-09-21 NOTE — ED Notes (Signed)
[name] [phone] would like an update immediately ?

## 2021-09-21 NOTE — ED Notes (Signed)
Patient is not interested in answering questions. ?

## 2021-09-21 NOTE — ED Notes (Signed)
Still refuses IV attempt, states he wants more information. ?

## 2021-09-21 NOTE — ED Provider Notes (Signed)
Update note ? ?51 year old presenting to ER due to concern for multiple missed dialysis sessions.  Nephrology was consulted, Dr. Melvia Heaps evaluated patient.  Patient completed session of hemodialysis today.  No complications.  I evaluated patient this evening, he denies any ongoing medical complaints.  Vitals were reviewed, hypertensive but otherwise within normal limits.  I discussed the case with Dr. Hollie Salk.  She reviewed patient's chart, notes from Dr. Melvia Heaps. She recommends discharging patient this evening and having patient go to his regular dialysis tomorrow.  I discussed case with Mariann Laster our case manager will help with transportation.  I discussed the importance of going to the dialysis session tomorrow with the patient.  He demonstrated understanding.  Discharged. ?  ?Lucrezia Starch, MD ?09/21/21 1951 ? ?

## 2021-09-21 NOTE — ED Notes (Signed)
Consent for dialysis sign by two nurses d/t pt giving verbal consent stating he cannot sign the paper because he cannot see ?

## 2021-09-21 NOTE — ED Notes (Signed)
Patient refused vitalsigns and refused to get labs drawn .has been ask x6 ?

## 2021-09-21 NOTE — ED Provider Notes (Signed)
?[name] ?Provider Note ? ? ?CSN: [id number] ?Arrival date & time: 09/20/21  2056 ? ?  ? ?History ? ?Chief Complaint  ?Patient presents with  ? Hypertension  ? ? ?[name] is a 51 y.o. male. ? ?The history is provided by the patient.  ?Hypertension ?He has history of hypertension, diabetes, hyperlipidemia, end-stage renal disease on hemodialysis (dialysis Tuesday-Thursday-Saturday) and comes in because he has missed his dialysis for the last 2 weeks and he was told that he had to come to the emergency department if he missed more than 6 dialysis sessions.  He has noted abdominal swelling and leg swelling but denies chest pain or dyspnea.  He also has only been taking his insulin intermittently, only when he feels that his blood sugar is high.  He states that he missed dialysis because of transportation issues.  He had been taking a [facility] bus but was told that they discovered he is living outside [facility] and he would need to walk to the office and he cannot walk because of exertional dyspnea.  He states that he has had a change in his insurance status and now can get transportation. ?  ?Home Medications ?Prior to Admission medications   ?Medication Sig Start Date End Date Taking? Authorizing Provider  ?amLODipine (NORVASC) 10 MG tablet Take 1 tablet by mouth once a day ?Patient not taking: Reported on 3/41/9622 2/97/98   Delora Fuel, MD  ?atorvastatin (LIPITOR) 40 MG tablet Take 1 tablet (40 mg total) by mouth daily. ?Patient not taking: Reported on 02/08/1940 7/40/81   Delora Fuel, MD  ?Blood Glucose Monitoring Suppl (ACCU-CHEK GUIDE) w/Device KIT Use as directed 11/29/20   [name], MD  ?Blood Glucose Monitoring Suppl (TRUE METRIX METER) w/Device KIT Use to measure blood sugar twice a day 02/22/20   [name], MD  ?calcitRIOL (ROCALTROL) 0.25 MCG capsule Take 1 capsule (0.25 mcg total) by mouth daily. ?Patient not taking: Reported on  4/48/1856 08/01/95   Delora Fuel, MD  ?calcium gluconate 500 MG tablet Take 1 tablet (500 mg total) by mouth 3 (three) times daily. ?Patient not taking: Reported on 0/26/3785 8/85/02   Delora Fuel, MD  ?carvedilol (COREG) 12.5 MG tablet Take 1 tablet (12.25m total) by mouth two times a day with a meal. ?Patient not taking: Reported on 27/74/128718/67/67  GDelora Fuel MD  ?furosemide (LASIX) 40 MG tablet Take 1 tablet (40 mg total) by mouth daily. ?Patient not taking: Reported on 07/06/2021 04/28/4719/96/28 GDelora Fuel MD  ?glucose blood test strip Use to check fasting blood sugar once daily. 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 Insulin dependent 11/29/20   KAnnita Brod MD  ?hydrALAZINE (APRESOLINE) 25 MG tablet Take 1 tablet by mouth three times a day ?Patient not taking: Reported on 23/66/294716/54/65  GDelora Fuel MD  ?insulin aspart (NOVOLOG FLEXPEN) 100 UNIT/ML FlexPen Inject 5 Units into the skin 2 (two) times daily with a meal. ?Patient taking differently: Inject 5 Units into the skin 2 (two) times daily as needed for high blood sugar (CBG >200). 02/26/21   [name], DSilver Huguenin MD  ?insulin glargine (LANTUS) 100 UNIT/ML Solostar Pen Inject 10 Units into the skin daily. May substitute with any other long-acting formulary. ?Patient taking differently: Inject 10 Units into the skin daily as needed (CBG >200). May substitute with any other long-acting formulary. 05/13/21   PDarliss Cheney MD  ?Insulin Pen Needle 32G X 4 MM MISC Use with  insulin pens 02/26/21   [name], [name], MD  ?sertraline (ZOLOFT) 50 MG tablet Take 1 tablet (50 mg total) by mouth daily. ?Patient not taking: Reported on 07/06/2021 11/05/48 9/32/67  Delora Fuel, MD  ?TRUEplus Lancets 28G MISC USE TO MEASURE BLOOD SUGAR TWICE A DAY 11/29/20 11/29/21  [name], MD  ?gabapentin (NEURONTIN) 300 MG capsule Take 1 capsule (300 mg total) by mouth 3 (three) times daily. ?Patient not taking: Reported on 07/27/2019 07/29/18 08/30/19  [name], FNP   ?insulin detemir (LEVEMIR FLEXTOUCH) 100 UNIT/ML FlexPen Inject 35 Units into the skin daily. ?Patient not taking: Reported on 02/17/2021 11/29/20 02/24/21  [name], MD  ?sildenafil (VIAGRA) 25 MG tablet Take 1 tablet (25 mg total) by mouth as needed for erectile dysfunction. for erectile dysfunction. ?Patient not taking: Reported on 08/11/2020 10/13/19 11/27/20  [name], FNP  ?sitaGLIPtin (JANUVIA) 25 MG tablet Take 1 tablet (25 mg total) by mouth daily. ?Patient not taking: Reported on 08/11/2020 02/22/20 11/27/20  [name], MD  ?   ? ?Allergies    ?Sulfa antibiotics, Cephalexin, Other, and Bactrim [sulfamethoxazole-trimethoprim]   ? ?Review of Systems   ?Review of Systems  ?All other systems reviewed and are negative. ? ?Physical Exam ?Updated Vital Signs ?BP (!) 192/111 (BP Location: Left Arm)   Pulse 87   Temp 98.3 ?F (36.8 ?C) (Oral)   Resp 16   SpO2 96%  ?Physical Exam ?Vitals and nursing note reviewed.  ?51 year old male, resting comfortably and in no acute distress. Vital signs are significant for elevated blood pressure. Oxygen saturation is 96%, which is normal. ?Head is normocephalic and atraumatic. PERRLA, EOMI. Oropharynx is clear. ?Neck is nontender and supple without adenopathy.  JVD is ppresent. ?Back is nontender and there is no CVA tenderness.  There is 1-2+ presacral edema. ?Lungs are clear without rales, wheezes, or rhonchi. ?Chest is nontender. ?Heart has regular rate and rhythm without murmur. ?Abdomen is soft, slightly distended, nontender. ?Extremities have 3+ edema, full range of motion is present. ?Skin is warm and dry without rash. ?Neurologic: Mental status is normal, cranial nerves are intact, moves all extremities equally. ? ?ED Results / Procedures / Treatments   ?Labs ?(all labs ordered are listed, but only abnormal results are displayed) ?Labs Reviewed  ?CBC WITH DIFFERENTIAL/PLATELET - Abnormal; Notable for the following components:  ?    Result Value  ?  RBC 3.95 (*)   ? Hemoglobin 10.9 (*)   ? HCT 34.4 (*)   ? RDW 16.3 (*)   ? All other components within normal limits  ?COMPREHENSIVE METABOLIC PANEL - Abnormal; Notable for the following components:  ? CO2 15 (*)   ? Glucose, Bld 172 (*)   ? BUN 88 (*)   ? Creatinine, Ser 12.08 (*)   ? Calcium 7.1 (*)   ? Albumin 2.2 (*)   ? Alkaline Phosphatase 286 (*)   ? GFR, Estimated 5 (*)   ? All other components within normal limits  ?TROPONIN I (HIGH SENSITIVITY) - Abnormal; Notable for the following components:  ? Troponin I (High Sensitivity) 159 (*)   ? All other components within normal limits  ?TROPONIN I (HIGH SENSITIVITY) - Abnormal; Notable for the following components:  ? Troponin I (High Sensitivity) 162 (*)   ? All other components within normal limits  ?LIPASE, BLOOD  ?I-STAT CHEM 8, ED  ? ? ?EKG ?EKG Interpretation ? ?Date/Time:  Thursday Sep 20 2021 21:18:00 EDT ?Ventricular  Rate:  90 ?PR Interval:  138 ?QRS Duration: 146 ?QT Interval:  428 ?QTC Calculation: 523 ?R Axis:   245 ?Text Interpretation: Normal sinus rhythm Left bundle branch block Abnormal ECG When compared with ECG of 06-Jul-2021 09:03, No significant change was found Confirmed by Delora Fuel ([id number]) on 09/21/2021 3:16:17 AM ? ?Radiology ?DG Chest 2 View ? ?Result Date: 09/20/2021 ?CLINICAL DATA:  Shortness of breath EXAM: CHEST - 2 VIEW COMPARISON:  07/06/2021 FINDINGS: Cardiomegaly with vascular congestion and mild interstitial edema. No pleural effusion. Patchy basilar opacities could be due to atelectasis or mild pneumonia. No pneumothorax. IMPRESSION: 1. Cardiomegaly with vascular congestion and interstitial edema 2. Patchy basilar and left mid lung opacities could reflect atelectasis or mild pneumonia. Electronically Signed   By: Donavan Foil M.D.   On: 09/20/2021 21:57   ? ?Procedures ?Procedures  ? ? ?Medications Ordered in ED ?Medications  ?aspirin chewable tablet 324 mg (has no administration in time range)  ? ? ?ED Course/ Medical Decision  Making/ A&P ?  ?                        ?Medical Decision Making ?Risk ?OTC drugs. ? ? ?End-stage renal disease and peripheral edema secondary to missing dialysis sessions.  Elevated blood pressure, also l

## 2021-09-21 NOTE — Progress Notes (Signed)
Pt receives out-pt HD at [facility]) on TTS. Pt is to arrive at 12:15 for 12:35 chair time. Received information from [facility] clinic social worker regarding pt's current situation. Pt is currently staying with his mother. Pt recently approved for medicaid. Clinic social worker reports that pt has been approved for medicaid transportation and transportation should not be an issue at d/c. Transportation went to pick pt up on Tuesday and pt declined transport to HD clinic. Clinic aware pt currently at [facility]. Information from clinic social worker provided to nephrologist and renal NP. Will assist as needed.  ? ? ?Melven Sartorius ?Renal Navigator ?[phone] ?

## 2021-09-21 NOTE — Procedures (Signed)
Pt's BP upon arrival to hemodialysis was 176/107, Fistula easily cannulated with 15g needles. No bleeding noted from cannulation sites.  PT tolerated treatment with stable vital signs throughout.  Upon completion of treatment pt was hypertensive with BP of 206/114, [place] MD notified.  Order received for 10 mg Hydralazine and given.  Pt BP began to trend downward and pt was transferred to the ED.  BP following hydralazine dosage was 189/104.  No other medication was given.  Pt was in stable condition upon leaving the dialysis unit.  No blooding was noted following decannulation of fistula ?

## 2021-09-21 NOTE — Procedures (Signed)
? ?  I was present at this dialysis session, have reviewed the session itself and made  appropriate changes ?Kelly Splinter MD ?[employer] ?pager [phone]   ?09/21/2021, 1:58 PM ? ? ?

## 2021-09-21 NOTE — ED Notes (Signed)
Patient and visitor asking for food on arrival to room.  Would not allow IV attempt. ?

## 2021-09-21 NOTE — Consult Note (Signed)
Renal Service ?Consult Note ?[facility] ? ?Cloyd Ragas Denzler ?09/21/2021 ?[name], MD ?Requesting Physician: Dr. Roxanne Mins, D.  ? ?Reason for Consult: ESRD pt w/ missed HD due to transportation issues ?HPI: The patient is a 51 y.o. year-old w/ hx of DM2, anxiety, HTN, HL, LBBB, esrd on HD who presented to ED last night for missed HD x about 2 wks due to transportation issues. Denies SOB or CP, + N/V though. In ED EKG showed LBBB, CXR showed vasc congestion and early IS edema. K+ okay. Trop was 159 not much change from prior values. We are asked to see for dialysis.  ? ?Pt seen in ED. States he is living w/ his mother in [place], he had transportation but there were some issues w/ the transportation company and he had insurance issues as well. States that he just got approved by Medicaid and that his transportation to HD shouldn't be a problem going forward.   ? ?Pt started HD in Oct 2022. Takes tums as binder. +leg swelling and mild DOE. No cough or CP no fevers.  ? ?ROS - denies CP, no joint pain, no HA, no blurry vision, no rash, no diarrhea, no nausea/ vomiting ? ? ?Past Medical History  ?Past Medical History:  ?Diagnosis Date  ? Anxiety 02/2019  ? Anxiety 10/2019  ? Diabetes mellitus type II, uncontrolled 07/17/2006  ? Elevated alkaline phosphatase level 01/2019  ? ESSENTIAL HYPERTENSION 07/17/2006  ? HYPERLIPIDEMIA 11/24/2008  ? Insomnia 02/2019  ? Left bundle branch block 02/22/2019  ? Lesion of lip 10/2019  ? Pneumonia 10/22/2011  ? Suicidal ideations 02/2019  ? Vitamin D deficiency 01/2019  ? ?Past Surgical History  ?Past Surgical History:  ?Procedure Laterality Date  ? ARM SURGERY    ? AV FISTULA PLACEMENT Right 02/20/2021  ? Procedure: RIGHT ARTERIOVENOUS GRAFT CREATION;  Surgeon: Angelia Mould, MD;  Location: [place];  Service: Vascular;  Laterality: Right;  ? INSERTION OF DIALYSIS CATHETER Left 02/20/2021  ? Procedure: INSERTION OF DIALYSIS CATHETER USING PALINDROME CATHETER;  Surgeon:  Angelia Mould, MD;  Location: [facility] OR;  Service: Vascular;  Laterality: Left;  ? IR REMOVAL TUN CV CATH W/O FL  07/27/2021  ? ULTRASOUND GUIDANCE FOR VASCULAR ACCESS  02/20/2021  ? Procedure: ULTRASOUND GUIDANCE FOR VASCULAR ACCESS;  Surgeon: Angelia Mould, MD;  Location: [place];  Service: Vascular;;  ? ?Family History  ?Family History  ?Problem Relation Age of Onset  ? Hypertension Mother 74  ? Cancer Mother   ?     bladder cancer  ? Alcohol abuse Father   ? Hypertension Brother   ? ?Social History  reports that he has never smoked. He has never used smokeless tobacco. He reports that he does not drink alcohol and does not use drugs. ?Allergies  ?Allergies  ?Allergen Reactions  ? Sulfa Antibiotics Rash  ?  Severe rash. Do not give sulfa medications  ? Cephalexin Other (See Comments)  ?  Patient with severe drug reaction including exfoliating skin rash and hypotension after being prescribed both cephalexin and sulfamethoxazole-trimethoprim simultaneously. Favor SMX as much more likely culprit as patient had tolerated other cephalosporins in the past, but cannot say with complete certainty that this was not related to cephalexin.   ? Other Other (See Comments)  ?  Patient states he is allergic to the TB test - unknown reaction  ? Bactrim [Sulfamethoxazole-Trimethoprim] Rash  ? ?Home medications ?Prior to Admission medications   ?Medication Sig Start Date  End Date Taking? Authorizing Provider  ?amLODipine (NORVASC) 10 MG tablet Take 1 tablet by mouth once a day ?Patient not taking: Reported on 11/17/4101 0/13/14   Delora Fuel, MD  ?atorvastatin (LIPITOR) 40 MG tablet Take 1 tablet (40 mg total) by mouth daily. ?Patient not taking: Reported on 3/88/8757 9/72/82   Delora Fuel, MD  ?Blood Glucose Monitoring Suppl (ACCU-CHEK GUIDE) w/Device KIT Use as directed 11/29/20   [name], MD  ?Blood Glucose Monitoring Suppl (TRUE METRIX METER) w/Device KIT Use to measure blood sugar twice a day 02/22/20    [name], MD  ?calcitRIOL (ROCALTROL) 0.25 MCG capsule Take 1 capsule (0.25 mcg total) by mouth daily. ?Patient not taking: Reported on 0/60/1561 5/37/94   Delora Fuel, MD  ?calcium gluconate 500 MG tablet Take 1 tablet (500 mg total) by mouth 3 (three) times daily. ?Patient not taking: Reported on 08/13/6145 0/92/95   Delora Fuel, MD  ?carvedilol (COREG) 12.5 MG tablet Take 1 tablet (12.62m total) by mouth two times a day with a meal. ?Patient not taking: Reported on 27/47/340317/09/64  GDelora Fuel MD  ?furosemide (LASIX) 40 MG tablet Take 1 tablet (40 mg total) by mouth daily. ?Patient not taking: Reported on 07/06/2021 13/83/8128/40/37 GDelora Fuel MD  ?glucose blood test strip Use to check fasting blood sugar once daily. 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 Insulin dependent 11/29/20   KAnnita Brod MD  ?hydrALAZINE (APRESOLINE) 25 MG tablet Take 1 tablet by mouth three times a day ?Patient not taking: Reported on 25/43/606717/03/40  GDelora Fuel MD  ?insulin aspart (NOVOLOG FLEXPEN) 100 UNIT/ML FlexPen Inject 5 Units into the skin 2 (two) times daily with a meal. ?Patient taking differently: Inject 5 Units into the skin 2 (two) times daily as needed for high blood sugar (CBG >200). 02/26/21   [name], DSilver Huguenin MD  ?insulin glargine (LANTUS) 100 UNIT/ML Solostar Pen Inject 10 Units into the skin daily. May substitute with any other long-acting formulary. ?Patient taking differently: Inject 10 Units into the skin daily as needed (CBG >200). May substitute with any other long-acting formulary. 05/13/21   PDarliss Cheney MD  ?Insulin Pen Needle 32G X 4 MM MISC Use with insulin pens 02/26/21   [name], DSilver Huguenin MD  ?sertraline (ZOLOFT) 50 MG tablet Take 1 tablet (50 mg total) by mouth daily. ?Patient not taking: Reported on 07/06/2021 13/52/4841/85/90 GDelora Fuel MD  ?TRUEplus Lancets 28G MISC USE TO MEASURE BLOOD SUGAR TWICE A DAY 11/29/20 11/29/21  KAnnita Brod MD  ?gabapentin (NEURONTIN) 300 MG capsule  Take 1 capsule (300 mg total) by mouth 3 (three) times daily. ?Patient not taking: Reported on 07/27/2019 07/29/18 08/30/19  SAzzie Glatter FNP  ?insulin detemir (LEVEMIR FLEXTOUCH) 100 UNIT/ML FlexPen Inject 35 Units into the skin daily. ?Patient not taking: Reported on 02/17/2021 11/29/20 02/24/21  KAnnita Brod MD  ?sildenafil (VIAGRA) 25 MG tablet Take 1 tablet (25 mg total) by mouth as needed for erectile dysfunction. for erectile dysfunction. ?Patient not taking: Reported on 08/11/2020 10/13/19 11/27/20  SAzzie Glatter FNP  ?sitaGLIPtin (JANUVIA) 25 MG tablet Take 1 tablet (25 mg total) by mouth daily. ?Patient not taking: Reported on 08/11/2020 02/22/20 11/27/20  WElsie Stain MD  ? ? ? ?Vitals:  ? 09/21/21 0500 09/21/21 0515 09/21/21 0518 09/21/21 0600  ?BP: (!) 183/95   (!) 162/97  ?Pulse: 92 90  88  ?Resp: 16  17 14   ?Temp:   97.8 ?F (  36.6 ?C)   ?TempSrc:   Oral   ?SpO2: 96% 97%  98%  ? ?Exam ?Gen alert, no distress ?No rash, cyanosis or gangrene ?Sclera anicteric, throat clear  ?No jvd or bruits ?Chest clear bilat to bases, no rales/ wheezing ?RRR no RG ?Abd soft ntnd no mass or ascites +bs ?GU normal male ?MS no joint effusions or deformity ?Ext 1-2+ diffuse bilat LE edema ?Neuro is alert, Ox 3 , nf ?   RFA AVG+bruit ? ? ? ? ? Home meds include - norvasc, coreg 12.5 bid, lasix 40, hydralazine 25 tid, insulin lantus+ humalog, zoloft, lipitor, prns/ vits/ supps ? ? ? ? OP HD: [facility] TTS ? 4h  71kg  450/ 1.5   2K/3Ca bath   Hep 2000   RFA AVG ?- last HD 4/13 74kg post ?- last Hb 10.4 on 4/13 ?- mircera 75 ug q4, last 3/16, overdue ?- venofer 100 q hd - not sure if ever started ?- rocaltrol 0.5 po tiw ? ? ?Assessment/ Plan: ?Vol overload - from missed HD w/ bilat LE edema and IS edema on xray. On RA w/o acute distress. Plan as below.  ?ESRD - missed 2 wks HD, transportation and insurance issues. Has new [employer] now. Will consult our SW. Plan HD today and tomorrow.  ?HTN - cont BP meds ?Anemia  esrd - Hb 10.9, no esa needs  ?MBD ckd - CCa in range, will add on phos. Tums is binder will order. ?DM2 - on insulin, per pmd ?HL ?  ? ? ? ?Kelly Splinter  MD ?09/21/2021, 8:02 AM ?Recent Labs  ?Lab 09/20/21 ?210

## 2021-09-21 NOTE — Care Management (Signed)
Ed RNCM met with patient to discuss discharge and HD transportation issues tomorrow. Patient states he uses [facility] transportation T/T/Sat for his  chair time of 12:30p Contacted [facility] Medicaid  Transport [phone] and confirmed that patient has reservation for tomorrow at 10:45 pick for transport to [facility]. Patient updated. No further ED CM needs  ?

## 2021-09-21 NOTE — ED Notes (Signed)
Patient refusing vitals. States "they can get it later." Triage rn notified ?

## 2021-09-21 NOTE — ED Notes (Signed)
SORT nursing tech Amelia Jo notified this RN that pt is refusing repeat vital signs and refuses repeat bloodwork for Troponin.  ?

## 2021-09-22 LAB — HEPATITIS B SURFACE ANTIBODY, QUANTITATIVE: Hep B S AB Quant (Post): <3.1 m[IU]/mL — ABNORMAL LOW (ref >9.9)

## 2021-09-28 ENCOUNTER — Other Ambulatory Visit: Payer: Self-pay

## 2021-09-28 ENCOUNTER — Inpatient Hospital Stay (HOSPITAL_COMMUNITY)
Admission: EM | Admit: 2021-09-28 | Discharge: 2021-10-01 | DRG: 291 | Disposition: A | Discharge status: Home / Self Care | Payer: Medicaid Other | Attending: Internal Medicine | Admitting: Internal Medicine

## 2021-09-28 ENCOUNTER — Encounter (HOSPITAL_COMMUNITY): Payer: Self-pay

## 2021-09-28 ENCOUNTER — Emergency Department (HOSPITAL_COMMUNITY): Payer: Medicaid Other

## 2021-09-28 DIAGNOSIS — Z6828 Body mass index (BMI) 28.0-28.9, adult: Secondary | ICD-10-CM | POA: Diagnosis not present

## 2021-09-28 DIAGNOSIS — N2581 Secondary hyperparathyroidism of renal origin: Secondary | ICD-10-CM | POA: Diagnosis present

## 2021-09-28 DIAGNOSIS — E1022 Type 1 diabetes mellitus with diabetic chronic kidney disease: Secondary | ICD-10-CM | POA: Diagnosis present

## 2021-09-28 DIAGNOSIS — E785 Hyperlipidemia, unspecified: Secondary | ICD-10-CM | POA: Diagnosis present

## 2021-09-28 DIAGNOSIS — J9621 Acute and chronic respiratory failure with hypoxia: Secondary | ICD-10-CM | POA: Diagnosis present

## 2021-09-28 DIAGNOSIS — F419 Anxiety disorder, unspecified: Secondary | ICD-10-CM | POA: Diagnosis present

## 2021-09-28 DIAGNOSIS — Z882 Allergy status to sulfonamides status: Secondary | ICD-10-CM | POA: Diagnosis not present

## 2021-09-28 DIAGNOSIS — Z9981 Dependence on supplemental oxygen: Secondary | ICD-10-CM | POA: Diagnosis not present

## 2021-09-28 DIAGNOSIS — I161 Hypertensive emergency: Secondary | ICD-10-CM | POA: Diagnosis present

## 2021-09-28 DIAGNOSIS — Z91158 Patient's noncompliance with renal dialysis for other reason: Secondary | ICD-10-CM | POA: Diagnosis not present

## 2021-09-28 DIAGNOSIS — I132 Hypertensive heart and chronic kidney disease with heart failure and with stage 5 chronic kidney disease, or end stage renal disease: Secondary | ICD-10-CM | POA: Diagnosis present

## 2021-09-28 DIAGNOSIS — J690 Pneumonitis due to inhalation of food and vomit: Secondary | ICD-10-CM | POA: Diagnosis present

## 2021-09-28 DIAGNOSIS — Z881 Allergy status to other antibiotic agents status: Secondary | ICD-10-CM | POA: Diagnosis not present

## 2021-09-28 DIAGNOSIS — D631 Anemia in chronic kidney disease: Secondary | ICD-10-CM | POA: Diagnosis present

## 2021-09-28 DIAGNOSIS — J189 Pneumonia, unspecified organism: Secondary | ICD-10-CM

## 2021-09-28 DIAGNOSIS — Z992 Dependence on renal dialysis: Secondary | ICD-10-CM

## 2021-09-28 DIAGNOSIS — J9601 Acute respiratory failure with hypoxia: Secondary | ICD-10-CM | POA: Diagnosis present

## 2021-09-28 DIAGNOSIS — N186 End stage renal disease: Secondary | ICD-10-CM | POA: Diagnosis present

## 2021-09-28 DIAGNOSIS — Z79899 Other long term (current) drug therapy: Secondary | ICD-10-CM

## 2021-09-28 DIAGNOSIS — Z8249 Family history of ischemic heart disease and other diseases of the circulatory system: Secondary | ICD-10-CM

## 2021-09-28 DIAGNOSIS — I5043 Acute on chronic combined systolic (congestive) and diastolic (congestive) heart failure: Secondary | ICD-10-CM | POA: Diagnosis present

## 2021-09-28 DIAGNOSIS — J810 Acute pulmonary edema: Secondary | ICD-10-CM

## 2021-09-28 DIAGNOSIS — E8770 Fluid overload, unspecified: Secondary | ICD-10-CM | POA: Diagnosis present

## 2021-09-28 DIAGNOSIS — I10 Essential (primary) hypertension: Secondary | ICD-10-CM | POA: Diagnosis present

## 2021-09-28 DIAGNOSIS — E441 Mild protein-calorie malnutrition: Secondary | ICD-10-CM | POA: Diagnosis present

## 2021-09-28 DIAGNOSIS — Z20822 Contact with and (suspected) exposure to covid-19: Secondary | ICD-10-CM | POA: Diagnosis present

## 2021-09-28 DIAGNOSIS — E1122 Type 2 diabetes mellitus with diabetic chronic kidney disease: Secondary | ICD-10-CM

## 2021-09-28 DIAGNOSIS — N189 Chronic kidney disease, unspecified: Secondary | ICD-10-CM

## 2021-09-28 DIAGNOSIS — Z794 Long term (current) use of insulin: Secondary | ICD-10-CM

## 2021-09-28 DIAGNOSIS — I120 Hypertensive chronic kidney disease with stage 5 chronic kidney disease or end stage renal disease: Secondary | ICD-10-CM

## 2021-09-28 LAB — I-STAT VENOUS BLOOD GAS, ED
Acid-base deficit: 2 mmol/L (ref 0.0–2.0)
Bicarbonate: 22.5 mmol/L (ref 20.0–28.0)
Calcium, Ion: 0.81 mmol/L — CL (ref 1.15–1.40)
HCT: 35 % — ABNORMAL LOW (ref 39.0–52.0)
Hemoglobin: 11.9 g/dL — ABNORMAL LOW (ref 13.0–17.0)
O2 Saturation: 93 %
Potassium: 4.8 mmol/L (ref 3.5–5.1)
Sodium: 135 mmol/L (ref 135–145)
TCO2: 24 mmol/L (ref 22–32)
pCO2, Ven: 38.5 mmHg — ABNORMAL LOW (ref 44–60)
pH, Ven: 7.375 (ref 7.25–7.43)
pO2, Ven: 67 mmHg — ABNORMAL HIGH (ref 32–45)

## 2021-09-28 LAB — CBC WITH DIFFERENTIAL/PLATELET
Abs Immature Granulocytes: 0.07 10*3/uL (ref 0.00–0.07)
Basophils Absolute: 0 10*3/uL (ref 0.0–0.1)
Basophils Relative: 0 %
Eosinophils Absolute: 0.2 10*3/uL (ref 0.0–0.5)
Eosinophils Relative: 2 %
HCT: 33.7 % — ABNORMAL LOW (ref 39.0–52.0)
Hemoglobin: 11 g/dL — ABNORMAL LOW (ref 13.0–17.0)
Immature Granulocytes: 1 %
Lymphocytes Relative: 8 %
Lymphs Abs: 0.9 10*3/uL (ref 0.7–4.0)
MCH: 28.4 pg (ref 26.0–34.0)
MCHC: 32.6 g/dL (ref 30.0–36.0)
MCV: 86.9 fL (ref 80.0–100.0)
Monocytes Absolute: 0.7 10*3/uL (ref 0.1–1.0)
Monocytes Relative: 6 %
Neutro Abs: 9.2 10*3/uL — ABNORMAL HIGH (ref 1.7–7.7)
Neutrophils Relative %: 83 %
Platelets: 398 10*3/uL (ref 150–400)
RBC: 3.88 MIL/uL — ABNORMAL LOW (ref 4.22–5.81)
RDW: 15.9 % — ABNORMAL HIGH (ref 11.5–15.5)
WBC: 11.2 10*3/uL — ABNORMAL HIGH (ref 4.0–10.5)
nRBC: 0 % (ref 0.0–0.2)

## 2021-09-28 LAB — BRAIN NATRIURETIC PEPTIDE: B Natriuretic Peptide: 3254.3 pg/mL — ABNORMAL HIGH (ref 0.0–100.0)

## 2021-09-28 LAB — I-STAT CHEM 8, ED
BUN: 47 mg/dL — ABNORMAL HIGH (ref 6–20)
Calcium, Ion: 0.87 mmol/L — CL (ref 1.15–1.40)
Chloride: 103 mmol/L (ref 98–111)
Creatinine, Ser: 10.7 mg/dL — ABNORMAL HIGH (ref 0.61–1.24)
Glucose, Bld: 128 mg/dL — ABNORMAL HIGH (ref 70–99)
HCT: 35 % — ABNORMAL LOW (ref 39.0–52.0)
Hemoglobin: 11.9 g/dL — ABNORMAL LOW (ref 13.0–17.0)
Potassium: 3.9 mmol/L (ref 3.5–5.1)
Sodium: 137 mmol/L (ref 135–145)
TCO2: 23 mmol/L (ref 22–32)

## 2021-09-28 LAB — GLUCOSE, CAPILLARY
Glucose-Capillary: 88 mg/dL (ref 70–99)
Glucose-Capillary: 94 mg/dL (ref 70–99)

## 2021-09-28 LAB — BASIC METABOLIC PANEL
Anion gap: 12 (ref 5–15)
BUN: 55 mg/dL — ABNORMAL HIGH (ref 6–20)
CO2: 22 mmol/L (ref 22–32)
Calcium: 6.9 mg/dL — ABNORMAL LOW (ref 8.9–10.3)
Chloride: 103 mmol/L (ref 98–111)
Creatinine, Ser: 9.52 mg/dL — ABNORMAL HIGH (ref 0.61–1.24)
GFR, Estimated: 6 mL/min — ABNORMAL LOW (ref >60)
Glucose, Bld: 134 mg/dL — ABNORMAL HIGH (ref 70–99)
Potassium: 3.9 mmol/L (ref 3.5–5.1)
Sodium: 137 mmol/L (ref 135–145)

## 2021-09-28 LAB — LACTIC ACID, PLASMA: Lactic Acid, Venous: 1.5 mmol/L (ref 0.5–1.9)

## 2021-09-28 LAB — TROPONIN I (HIGH SENSITIVITY): Troponin I (High Sensitivity): 136 ng/L (ref <18)

## 2021-09-28 MED ORDER — ACETAMINOPHEN 650 MG RE SUPP: 650.0000 mg | Freq: Four times a day (QID) | RECTAL | Status: DC | PRN

## 2021-09-28 MED ORDER — CALCITRIOL 0.25 MCG PO CAPS
0.2500 ug | ORAL_CAPSULE | Freq: Every day | ORAL | Status: DC
  Administered 2021-09-29 – 2021-10-01 (×3): 0.25 ug via ORAL
  Filled 2021-09-28 (×3): qty 1

## 2021-09-28 MED ORDER — LORAZEPAM 2 MG/ML IJ SOLN
INTRAMUSCULAR | Status: AC
  Filled 2021-09-28: qty 1

## 2021-09-28 MED ORDER — PIPERACILLIN-TAZOBACTAM IN DEX 2-0.25 GM/50ML IV SOLN
2.2500 g | Freq: Three times a day (TID) | INTRAVENOUS | Status: DC
  Administered 2021-09-28 – 2021-09-30 (×7): 2.25 g via INTRAVENOUS
  Filled 2021-09-28 (×11): qty 50

## 2021-09-28 MED ORDER — OXYCODONE HCL 5 MG PO TABS: 5.0000 mg | ORAL_TABLET | ORAL | Status: DC | PRN

## 2021-09-28 MED ORDER — ONDANSETRON HCL 4 MG/2ML IJ SOLN
4.0000 mg | Freq: Once | INTRAMUSCULAR | Status: AC
  Administered 2021-09-28: 4 mg via INTRAVENOUS

## 2021-09-28 MED ORDER — FUROSEMIDE 10 MG/ML IJ SOLN
INTRAMUSCULAR | Status: AC
  Filled 2021-09-28: qty 2

## 2021-09-28 MED ORDER — CALCIUM GLUCONATE 500 MG PO TABS: 1.0000 | ORAL_TABLET | Freq: Three times a day (TID) | ORAL | Status: DC

## 2021-09-28 MED ORDER — LIDOCAINE HCL (PF) 1 % IJ SOLN: 5.0000 mL | INTRAMUSCULAR | Status: DC | PRN

## 2021-09-28 MED ORDER — FUROSEMIDE 10 MG/ML IJ SOLN: 60.0000 mg | Freq: Once | INTRAMUSCULAR | Status: AC

## 2021-09-28 MED ORDER — VANCOMYCIN HCL 1750 MG/350ML IV SOLN
1750.0000 mg | Freq: Once | INTRAVENOUS | Status: AC
  Administered 2021-09-28: 1750 mg via INTRAVENOUS
  Filled 2021-09-28: qty 350

## 2021-09-28 MED ORDER — ONDANSETRON HCL 4 MG/2ML IJ SOLN: 4.0000 mg | Freq: Four times a day (QID) | INTRAMUSCULAR | Status: DC | PRN

## 2021-09-28 MED ORDER — SODIUM CHLORIDE 0.9% FLUSH
3.0000 mL | Freq: Two times a day (BID) | INTRAVENOUS | Status: DC
  Administered 2021-09-28 – 2021-09-30 (×5): 3 mL via INTRAVENOUS

## 2021-09-28 MED ORDER — PENTAFLUOROPROP-TETRAFLUOROETH EX AERO: 1.0000 "application " | INHALATION_SPRAY | CUTANEOUS | Status: DC | PRN

## 2021-09-28 MED ORDER — FUROSEMIDE 10 MG/ML IJ SOLN
INTRAMUSCULAR | Status: AC
  Administered 2021-09-28: 60 mg via INTRAVENOUS
  Filled 2021-09-28: qty 4

## 2021-09-28 MED ORDER — AMLODIPINE BESYLATE 5 MG PO TABS
10.0000 mg | ORAL_TABLET | Freq: Every day | ORAL | Status: DC
Start: 2021-09-28 — End: 2021-09-29
  Administered 2021-09-28: 10 mg via ORAL
  Filled 2021-09-28: qty 2

## 2021-09-28 MED ORDER — SODIUM CHLORIDE 0.9 % IV SOLN: 100.0000 mL | INTRAVENOUS | Status: DC | PRN

## 2021-09-28 MED ORDER — ALTEPLASE 2 MG IJ SOLR: 2.0000 mg | Freq: Once | INTRAMUSCULAR | Status: DC | PRN

## 2021-09-28 MED ORDER — VANCOMYCIN HCL 750 MG/150ML IV SOLN
750.0000 mg | Freq: Once | INTRAVENOUS | Status: AC
  Administered 2021-09-28: 750 mg via INTRAVENOUS
  Filled 2021-09-28: qty 150

## 2021-09-28 MED ORDER — NITROGLYCERIN IN D5W 200-5 MCG/ML-% IV SOLN
INTRAVENOUS | Status: AC
  Administered 2021-09-28: 5 ug/min via INTRAVENOUS
  Filled 2021-09-28: qty 250

## 2021-09-28 MED ORDER — LORAZEPAM 2 MG/ML IJ SOLN
1.0000 mg | Freq: Once | INTRAMUSCULAR | Status: AC
Start: 2021-09-28 — End: 2021-09-28
  Administered 2021-09-28: 1 mg via INTRAVENOUS

## 2021-09-28 MED ORDER — CALCIUM CITRATE 950 (200 CA) MG PO TABS
200.0000 mg | ORAL_TABLET | Freq: Two times a day (BID) | ORAL | Status: DC
  Administered 2021-09-29 – 2021-09-30 (×5): 200 mg via ORAL
  Filled 2021-09-28 (×7): qty 1

## 2021-09-28 MED ORDER — SODIUM CHLORIDE 0.9% FLUSH: 3.0000 mL | INTRAVENOUS | Status: DC | PRN

## 2021-09-28 MED ORDER — ACETAMINOPHEN 325 MG PO TABS: 650.0000 mg | ORAL_TABLET | Freq: Four times a day (QID) | ORAL | Status: DC | PRN

## 2021-09-28 MED ORDER — HEPARIN SODIUM (PORCINE) 1000 UNIT/ML DIALYSIS
1000.0000 [IU] | INTRAMUSCULAR | Status: DC | PRN
  Filled 2021-09-28: qty 1

## 2021-09-28 MED ORDER — HYDRALAZINE HCL 25 MG PO TABS
25.0000 mg | ORAL_TABLET | Freq: Three times a day (TID) | ORAL | Status: DC
  Administered 2021-09-28: 25 mg via ORAL
  Filled 2021-09-28: qty 1

## 2021-09-28 MED ORDER — ONDANSETRON HCL 4 MG/2ML IJ SOLN
4.0000 mg | Freq: Once | INTRAMUSCULAR | Status: DC
  Filled 2021-09-28: qty 2

## 2021-09-28 MED ORDER — POLYETHYLENE GLYCOL 3350 17 G PO PACK: 17.0000 g | PACK | Freq: Every day | ORAL | Status: DC | PRN

## 2021-09-28 MED ORDER — VANCOMYCIN HCL 750 MG/150ML IV SOLN
750.0000 mg | INTRAVENOUS | Status: DC
  Administered 2021-09-29: 750 mg via INTRAVENOUS
  Filled 2021-09-28 (×2): qty 150

## 2021-09-28 MED ORDER — SODIUM CHLORIDE 0.9 % IV SOLN: 1.0000 g | Freq: Once | INTRAVENOUS | Status: DC

## 2021-09-28 MED ORDER — HYDRALAZINE HCL 20 MG/ML IJ SOLN
20.0000 mg | INTRAMUSCULAR | Status: DC | PRN
  Filled 2021-09-28: qty 1

## 2021-09-28 MED ORDER — ONDANSETRON HCL 4 MG PO TABS: 4.0000 mg | ORAL_TABLET | Freq: Four times a day (QID) | ORAL | Status: DC | PRN

## 2021-09-28 MED ORDER — LIDOCAINE-PRILOCAINE 2.5-2.5 % EX CREA: 1.0000 "application " | TOPICAL_CREAM | CUTANEOUS | Status: DC | PRN

## 2021-09-28 MED ORDER — NITROGLYCERIN IN D5W 200-5 MCG/ML-% IV SOLN: 0.0000 ug/min | INTRAVENOUS | Status: DC

## 2021-09-28 MED ORDER — CARVEDILOL 12.5 MG PO TABS
12.5000 mg | ORAL_TABLET | Freq: Two times a day (BID) | ORAL | Status: DC
  Administered 2021-09-28 – 2021-10-01 (×6): 12.5 mg via ORAL
  Filled 2021-09-28 (×6): qty 1

## 2021-09-28 MED ORDER — INSULIN ASPART 100 UNIT/ML IJ SOLN
0.0000 [IU] | Freq: Three times a day (TID) | INTRAMUSCULAR | Status: DC
  Administered 2021-09-30 (×2): 2 [IU] via SUBCUTANEOUS

## 2021-09-28 MED ORDER — SODIUM CHLORIDE 0.9 % IV SOLN: 250.0000 mL | INTRAVENOUS | Status: DC | PRN

## 2021-09-28 NOTE — ED Notes (Signed)
Pt tolerating being on 6L O2 via [place] well ?

## 2021-09-28 NOTE — ED Notes (Signed)
Pt uncooperative and not willing to have bipap placed on him at this time. RT and MD at bedside. Pt currently on NRB d/t desat to the 60's. ?

## 2021-09-28 NOTE — ED Notes (Signed)
Phlebotomy at bedside to attempt lab draw ?

## 2021-09-28 NOTE — ED Notes (Signed)
Pt resting comfortably on stretcher at this time. Pt tolerating Bipap well at this time.  ?

## 2021-09-28 NOTE — ED Triage Notes (Signed)
Pt arrived to ED via EMS from dialysis w/ c/o respiratory distress. Pt is a T, Th, Sa dialysis pt who has missed a weeks worth of dialysis d/t transportation issues per pt. Pt was getting dialysis tx today and was about 30 mins into it when he started becoming shob and per EMS coughing up pulmonary edema like sputum. EMS reports 78% RA sat. EMS placed pt on cpap which lasted about 5 mins d/t pt intolerance. EMS then placed pt on NRB at 15L and sats increased to 97%. Pt reports being shob x several days. R arm fistula clamped upon arrival to ED. EMS VS: initial BP 204/110, last BP 210/108 after 2 SL nitro, HR 58-72. Pt A&Ox4 ?

## 2021-09-28 NOTE — ED Notes (Signed)
IV team at bedside. Pt cooperative at this time for IV team RN to place IV ?

## 2021-09-28 NOTE — ED Notes (Signed)
RT at bedside w/ Bipap ?

## 2021-09-28 NOTE — ED Notes (Signed)
Admitting MD at bedside.

## 2021-09-28 NOTE — Progress Notes (Signed)
Pt alert and oriented x4. Pt resting in bed without distress. Pt oxygen saturation 97% on 3.5 L via nasal cannula. Pt oxygen saturation 85% on room air. Call bell within reach. Pt denies having any further questions or concerns at this time.  ?

## 2021-09-28 NOTE — H&P (Signed)
?History and Physical  ? ? ?[name]:[id number] DOB: 06-10-1970 DOA: 09/28/2021 ? ?PCP: Vevelyn Francois, NP  ?Patient coming from: Home ? ?I have personally briefly reviewed patient's old medical records in [place] ? ?Chief Complaint: Shortness of breath and respiratory distress ? ?HPI: [name] is a 51 y.o. male with medical history significant of hypertension, diabetes, and end-stage renal disease on dialysis on Tuesdays Thursdays and Saturdays who was sent here from the dialysis center with shortness of breath and respiratory distress.  He had missed his dialysis all last week and has not been dialyzed since May 5.  He was worked in today but was only on the machine for about 30 minutes when he developed respiratory distress.  He had difficulty with transportation issues last week.  He was trying to give me some history where he stated that the transportation did come but he was unable to finish his sentence due to shortness of breath.  He was on BiPAP when I saw him.  He has had consistently ongoing left-sided chest pain for a week as well as cough and increased shortness of breath.  He does make some urine and was given Lasix in the emergency department with little effect.  Patient placed on CPAP then nonrebreather however was unable to tolerate it and required BiPAP in the emergency department.  Most of the history is obtained from the chart and the ED physician. ? ?ED Course: Held nonrebreather on arrival switched to BiPAP.  Chest x-ray consistent with asymmetrical pulmonary edema with right sided opacities.  Massive cardiomegaly and patchy interstitial opacities reflecting interstitial edema.  Nephrology was consulted for emergent/urgent dialysis.  Potassium was noted to be acceptable at 3.9.  ? ?Review of patient's medication reveals that he has not been taking many of his supportive medications including his Calcitrol calcium gluconate, insulin several other of his medications.   He states he is not taking them due to his choice although some of his medications were too expensive for him to afford. ? ?Review of Systems: As per HPI otherwise all other systems reviewed and  negative.  ? ?Past Medical History:  ?Diagnosis Date  ? Anxiety 02/2019  ? Anxiety 10/2019  ? Diabetes mellitus type II, uncontrolled 07/17/2006  ? Elevated alkaline phosphatase level 01/2019  ? ESSENTIAL HYPERTENSION 07/17/2006  ? HYPERLIPIDEMIA 11/24/2008  ? Insomnia 02/2019  ? Left bundle branch block 02/22/2019  ? Lesion of lip 10/2019  ? Pneumonia 10/22/2011  ? Suicidal ideations 02/2019  ? Vitamin D deficiency 01/2019  ? ? ?Past Surgical History:  ?Procedure Laterality Date  ? ARM SURGERY    ? AV FISTULA PLACEMENT Right 02/20/2021  ? Procedure: RIGHT ARTERIOVENOUS GRAFT CREATION;  Surgeon: Angelia Mould, MD;  Location: [place];  Service: Vascular;  Laterality: Right;  ? INSERTION OF DIALYSIS CATHETER Left 02/20/2021  ? Procedure: INSERTION OF DIALYSIS CATHETER USING PALINDROME CATHETER;  Surgeon: Angelia Mould, MD;  Location: [facility] OR;  Service: Vascular;  Laterality: Left;  ? IR REMOVAL TUN CV CATH W/O FL  07/27/2021  ? ULTRASOUND GUIDANCE FOR VASCULAR ACCESS  02/20/2021  ? Procedure: ULTRASOUND GUIDANCE FOR VASCULAR ACCESS;  Surgeon: Angelia Mould, MD;  Location: [place];  Service: Vascular;;  ? ? ?Social History  ? ?Social History Narrative  ? Lives with his mother and brother.  ? Wife has had a foot amputation; is a type 1 DM.  ? ? ? reports that he has never smoked.  He has never used smokeless tobacco. He reports that he does not drink alcohol and does not use drugs. ? ?Allergies  ?Allergen Reactions  ? Sulfa Antibiotics Rash  ?  Severe rash.   ? Cephalexin Other (See Comments)  ?  Patient with severe drug reaction including exfoliating skin rash and hypotension after being prescribed both cephalexin and sulfamethoxazole-trimethoprim simultaneously. Favor SMX as much more likely culprit as patient had  tolerated other cephalosporins in the past, but cannot say with complete certainty that this was not related to cephalexin.   ? Other Other (See Comments)  ?  Patient states he is allergic to the TB test - unknown reaction  ? Bactrim [Sulfamethoxazole-Trimethoprim] Rash  ? ? ?Family History  ?Problem Relation Age of Onset  ? Hypertension Mother 59  ? Cancer Mother   ?     bladder cancer  ? Alcohol abuse Father   ? Hypertension Brother   ? ? ?Prior to Admission medications   ?Medication Sig Start Date End Date Taking? Authorizing Provider  ?amLODipine (NORVASC) 10 MG tablet Take 1 tablet by mouth once a day 09/22/67   Delora Fuel, MD  ?atorvastatin (LIPITOR) 40 MG tablet Take 1 tablet (40 mg total) by mouth daily. ?Patient not taking: Reported on 7/94/8016 5/53/74   Delora Fuel, MD  ?Blood Glucose Monitoring Suppl (ACCU-CHEK GUIDE) w/Device KIT Use as directed 11/29/20   [name], MD  ?Blood Glucose Monitoring Suppl (TRUE METRIX METER) w/Device KIT Use to measure blood sugar twice a day 02/22/20   [name], MD  ?calcitRIOL (ROCALTROL) 0.25 MCG capsule Take 1 capsule (0.25 mcg total) by mouth daily. ?Patient not taking: Reported on 01/13/785 7/54/49   Delora Fuel, MD  ?calcium gluconate 500 MG tablet Take 1 tablet (500 mg total) by mouth 3 (three) times daily. ?Patient not taking: Reported on 06/20/69 07/08/73   Delora Fuel, MD  ?carvedilol (COREG) 12.5 MG tablet Take 1 tablet (12.81m total) by mouth two times a day with a meal. 18/83/25  GDelora Fuel MD  ?glucose blood test strip Use to check fasting blood sugar once daily. 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 Insulin dependent 11/29/20   KAnnita Brod MD  ?hydrALAZINE (APRESOLINE) 25 MG tablet Take 1 tablet by mouth three times a day 14/98/26  GDelora Fuel MD  ?insulin aspart (NOVOLOG FLEXPEN) 100 UNIT/ML FlexPen Inject 5 Units into the skin 2 (two) times daily with a meal. ?Patient taking differently: Inject 5 Units into the skin 2 (two) times daily as  needed for high blood sugar (CBG >200). 02/26/21   [name], DSilver Huguenin MD  ?insulin glargine (LANTUS) 100 UNIT/ML Solostar Pen Inject 10 Units into the skin daily. May substitute with any other long-acting formulary. ?Patient taking differently: Inject 10 Units into the skin daily as needed (CBG >200). May substitute with any other long-acting formulary. 05/13/21   PDarliss Cheney MD  ?Insulin Pen Needle 32G X 4 MM MISC Use with insulin pens 02/26/21   [name], DSilver Huguenin MD  ?sertraline (ZOLOFT) 50 MG tablet Take 1 tablet (50 mg total) by mouth daily. ?Patient not taking: Reported on 07/06/2021 14/15/8340/94/07 GDelora Fuel MD  ?TRUEplus Lancets 28G MISC USE TO MEASURE BLOOD SUGAR TWICE A DAY 11/29/20 11/29/21  KAnnita Brod MD  ?gabapentin (NEURONTIN) 300 MG capsule Take 1 capsule (300 mg total) by mouth 3 (three) times daily. ?Patient not taking: Reported on 07/27/2019 07/29/18 08/30/19  SAzzie Glatter FNP  ?insulin detemir (LEVEMIR FLEXTOUCH)  100 UNIT/ML FlexPen Inject 35 Units into the skin daily. ?Patient not taking: Reported on 02/17/2021 11/29/20 02/24/21  [name], MD  ?sildenafil (VIAGRA) 25 MG tablet Take 1 tablet (25 mg total) by mouth as needed for erectile dysfunction. for erectile dysfunction. ?Patient not taking: Reported on 08/11/2020 10/13/19 11/27/20  [name], FNP  ?sitaGLIPtin (JANUVIA) 25 MG tablet Take 1 tablet (25 mg total) by mouth daily. ?Patient not taking: Reported on 08/11/2020 02/22/20 11/27/20  [name], MD  ? ? ?Physical Exam: ? ?Constitutional: Severe respiratory distress on BiPAP unable to really give any history ?Vitals:  ? 09/28/21 1139 09/28/21 1142 09/28/21 1145 09/28/21 1148  ?BP: (!) 189/101 (!) 195/101 (!) 189/103 (!) 176/112  ?Pulse: (!) 102 (!) 101 99 98  ?Resp: (!) 25 (!) 25 (!) 21 18  ?Temp:      ?TempSrc:      ?SpO2: 100% 100% 100% 99%  ?Weight:      ?Height:      ? ?Eyes: PERRL, lids and conjunctivae normal ?ENMT: Mucous membranes are moist.  Posterior pharynx clear of any exudate or lesions.Normal dentition.  ?Neck: normal, supple, no masses, no thyromegaly ?Respiratory: Coarse breath sounds bilaterally no wheezing, bilateral crackles.  Increased

## 2021-09-28 NOTE — ED Notes (Signed)
20g L FA US guided IV access placed by IV team RN at this time ?

## 2021-09-28 NOTE — Progress Notes (Signed)
Per MD don't get ABG due to pt not cooperating. ? ?

## 2021-09-28 NOTE — ED Notes (Signed)
Pt uncooperative at this time. Pt needs to have a BM but is refusing a bedpan. Pt adamant that he can get up and go to the bathroom. Pt not verbalizing understanding and not responding to safe limits at this time. Provider aware and came to bedside. Pt says if he can't use the bathroom he wants to leave. Pt remains uncooperative and not allowing RT to obtain ABG at this time ?

## 2021-09-28 NOTE — ED Notes (Signed)
IV team at bedside 

## 2021-09-28 NOTE — ED Notes (Signed)
Pt being trialed off bipap and on 6L [place] at this time ?

## 2021-09-28 NOTE — Consult Note (Addendum)
[facility] ?Renal Consultation Note  ?  ?Indication for Consultation:  Management of ESRD/hemodialysis, anemia, hypertension/volume, and secondary hyperparathyroidism. ?PCP: ? ?HPI: [name] is a 51 y.o. male with ESRD, HTN, T2DM who is being admitted with acute hypoxic respiratory failure. ? ?Brought to ED via EMS from his HD unit. Presented there with dyspnea for 2-3 days. Last HD was on 5/5 at [facility] during overnight admit. Prior to that, it had been 3 weeks without dialysis. He attributes transportation issues to missed HD. About 30 minutes into HD today, he developed sharp, 8/10 chest pain and was coughing up pink, frothy sputum. Decision was made to send him to the ED. Hypoxic with EMS, placed on NRB. ? ?In ED - was afebrile, BP 209/110. O2 sat was 98% on O2 6L/min. Labs with Na 135, K 4.8, CO2 22, BUN 47, Cr 10.7, Ca 6.9, Alb 2, BNP 3250, Trop 136 (150-160 range last week), WBC 11.2, Hgb 11.9, Plts 398. CXR with pulm edema, possible RML/RLL pneumonia. ? ?At time of my visit, he is on BiPAP and NTG drip. Looks like sats fell while on high O2. Says feeling comfortable at this time with improvement in CP. Denies N/V/D. ? ?Dialyzes on TTS schedule at [facility], last HD 5/5, then 08/30/21 prior to that. Has R AVF as his access which is useable.  ? ?Past Medical History:  ?Diagnosis Date  ? Anxiety 02/2019  ? Anxiety 10/2019  ? Diabetes mellitus type II, uncontrolled 07/17/2006  ? Elevated alkaline phosphatase level 01/2019  ? ESSENTIAL HYPERTENSION 07/17/2006  ? HYPERLIPIDEMIA 11/24/2008  ? Insomnia 02/2019  ? Left bundle branch block 02/22/2019  ? Lesion of lip 10/2019  ? Pneumonia 10/22/2011  ? Suicidal ideations 02/2019  ? Vitamin D deficiency 01/2019  ? ?Past Surgical History:  ?Procedure Laterality Date  ? ARM SURGERY    ? AV FISTULA PLACEMENT Right 02/20/2021  ? Procedure: RIGHT ARTERIOVENOUS GRAFT CREATION;  Surgeon: Angelia Mould, MD;  Location: [place];  Service: Vascular;   Laterality: Right;  ? INSERTION OF DIALYSIS CATHETER Left 02/20/2021  ? Procedure: INSERTION OF DIALYSIS CATHETER USING PALINDROME CATHETER;  Surgeon: Angelia Mould, MD;  Location: [facility] OR;  Service: Vascular;  Laterality: Left;  ? IR REMOVAL TUN CV CATH W/O FL  07/27/2021  ? ULTRASOUND GUIDANCE FOR VASCULAR ACCESS  02/20/2021  ? Procedure: ULTRASOUND GUIDANCE FOR VASCULAR ACCESS;  Surgeon: Angelia Mould, MD;  Location: [place];  Service: Vascular;;  ? ?Family History  ?Problem Relation Age of Onset  ? Hypertension Mother 60  ? Cancer Mother   ?     bladder cancer  ? Alcohol abuse Father   ? Hypertension Brother   ? ?Social History: ? reports that he has never smoked. He has never used smokeless tobacco. He reports that he does not drink alcohol and does not use drugs. ? ?ROS: As per HPI otherwise negative. ? ?Physical Exam: ?Vitals:  ? 09/28/21 1103 09/28/21 1104 09/28/21 1105 09/28/21 1105  ?BP: (!) 177/93     ?Pulse: (!) 102 (!) 101 (!) 101   ?Resp: (!) 24 (!) 24 (!) 28   ?Temp:      ?TempSrc:      ?SpO2: (!) 62% (!) 77% (!) 86% 96%  ?Weight:      ?Height:      ?   ?General: Well developed, BiPAP in place. ?Head: Normocephalic, atraumatic, sclera non-icteric, mucus membranes are moist. ?Neck: Supple without lymphadenopathy/masses.  ?Lungs:  Clear in upper lobes, reduced air movement in B bases. ?Heart: RRR with normal S1, S2. No murmurs, rubs, or gallops appreciated. ?Abdomen: Soft, non-tender, non-distended with normoactive bowel sounds. Musculoskeletal:  Strength and tone appear normal for age. ?Lower extremities: Dense 2+ pitting edema in BLE, 1+ dependent edema in B hips ?Neuro: Alert and oriented X 3. Moves all extremities spontaneously. ?Psych:  Responds to questions appropriately, limited with BiPAP ?Dialysis Access: R forearm AVF + bruit ? ?Allergies  ?Allergen Reactions  ? Sulfa Antibiotics Rash  ?  Severe rash.   ? Cephalexin Other (See Comments)  ?  Patient with severe drug reaction including  exfoliating skin rash and hypotension after being prescribed both cephalexin and sulfamethoxazole-trimethoprim simultaneously. Favor SMX as much more likely culprit as patient had tolerated other cephalosporins in the past, but cannot say with complete certainty that this was not related to cephalexin.   ? Other Other (See Comments)  ?  Patient states he is allergic to the TB test - unknown reaction  ? Bactrim [Sulfamethoxazole-Trimethoprim] Rash  ? ?Prior to Admission medications   ?Medication Sig Start Date End Date Taking? Authorizing Provider  ?amLODipine (NORVASC) 10 MG tablet Take 1 tablet by mouth once a day 3/84/66   Delora Fuel, MD  ?atorvastatin (LIPITOR) 40 MG tablet Take 1 tablet (40 mg total) by mouth daily. ?Patient not taking: Reported on 5/99/3570 1/77/93   Delora Fuel, MD  ?Blood Glucose Monitoring Suppl (ACCU-CHEK GUIDE) w/Device KIT Use as directed 11/29/20   [name], MD  ?Blood Glucose Monitoring Suppl (TRUE METRIX METER) w/Device KIT Use to measure blood sugar twice a day 02/22/20   [name], MD  ?calcitRIOL (ROCALTROL) 0.25 MCG capsule Take 1 capsule (0.25 mcg total) by mouth daily. ?Patient not taking: Reported on 01/21/91 08/16/05   Delora Fuel, MD  ?calcium gluconate 500 MG tablet Take 1 tablet (500 mg total) by mouth 3 (three) times daily. ?Patient not taking: Reported on 11/09/6331 5/45/62   Delora Fuel, MD  ?carvedilol (COREG) 12.5 MG tablet Take 1 tablet (12.46m total) by mouth two times a day with a meal. 15/63/89  GDelora Fuel MD  ?glucose blood test strip Use to check fasting blood sugar once daily. 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 Insulin dependent 11/29/20   KAnnita Brod MD  ?hydrALAZINE (APRESOLINE) 25 MG tablet Take 1 tablet by mouth three times a day 13/73/42  GDelora Fuel MD  ?insulin aspart (NOVOLOG FLEXPEN) 100 UNIT/ML FlexPen Inject 5 Units into the skin 2 (two) times daily with a meal. ?Patient taking differently: Inject 5 Units into the skin 2 (two) times  daily as needed for high blood sugar (CBG >200). 02/26/21   [name], DSilver Huguenin MD  ?insulin glargine (LANTUS) 100 UNIT/ML Solostar Pen Inject 10 Units into the skin daily. May substitute with any other long-acting formulary. ?Patient taking differently: Inject 10 Units into the skin daily as needed (CBG >200). May substitute with any other long-acting formulary. 05/13/21   PDarliss Cheney MD  ?Insulin Pen Needle 32G X 4 MM MISC Use with insulin pens 02/26/21   [name], DSilver Huguenin MD  ?sertraline (ZOLOFT) 50 MG tablet Take 1 tablet (50 mg total) by mouth daily. ?Patient not taking: Reported on 07/06/2021 18/76/8141/57/26 GDelora Fuel MD  ?TRUEplus Lancets 28G MISC USE TO MEASURE BLOOD SUGAR TWICE A DAY 11/29/20 11/29/21  KAnnita Brod MD  ?gabapentin (NEURONTIN) 300 MG capsule Take 1 capsule (300 mg total) by mouth 3 (three) times  daily. ?Patient not taking: Reported on 07/27/2019 07/29/18 08/30/19  [name], FNP  ?insulin detemir (LEVEMIR FLEXTOUCH) 100 UNIT/ML FlexPen Inject 35 Units into the skin daily. ?Patient not taking: Reported on 02/17/2021 11/29/20 02/24/21  [name], MD  ?sildenafil (VIAGRA) 25 MG tablet Take 1 tablet (25 mg total) by mouth as needed for erectile dysfunction. for erectile dysfunction. ?Patient not taking: Reported on 08/11/2020 10/13/19 11/27/20  [name], FNP  ?sitaGLIPtin (JANUVIA) 25 MG tablet Take 1 tablet (25 mg total) by mouth daily. ?Patient not taking: Reported on 08/11/2020 02/22/20 11/27/20  [name], MD  ? ?Current Facility-Administered Medications  ?Medication Dose Route Frequency Provider Last Rate Last Admin  ? LORazepam (ATIVAN) 2 MG/ML injection           ? nitroGLYCERIN 50 mg in dextrose 5 % 250 mL (0.2 mg/mL) infusion  0-200 mcg/min Intravenous Titrated Rancour, Stephen, MD 7.5 mL/hr at 09/28/21 1034 25 mcg/min at 09/28/21 1034  ? piperacillin-tazobactam (ZOSYN) IVPB 2.25 g  2.25 g Intravenous Q8H Jardin, Marlowe Kays, RPH      ? vancomycin  (VANCOREADY) IVPB 1750 mg/350 mL  1,750 mg Intravenous Once Kaleen Mask, RPH      ? ?Current Outpatient Medications  ?Medication Sig Dispense Refill  ? amLODipine (NORVASC) 10 MG tablet Take 1 tablet by mou

## 2021-09-28 NOTE — ED Notes (Signed)
Rancour MD removed clamps on fistula ?

## 2021-09-28 NOTE — ED Notes (Signed)
Phlebotomy unable to obtain labs. Notified Sheehan MD ?

## 2021-09-28 NOTE — ED Notes (Signed)
IV team unable to obtain blood. Notified EDP and got approval from EDP to start abx prior to cultures. ?

## 2021-09-28 NOTE — ED Notes (Signed)
Nephrologist at bedside

## 2021-09-28 NOTE — ED Notes (Signed)
Unable to obtain IV access. IV team consult placed. Phlebotomy at bedside for labs ?

## 2021-09-28 NOTE — ED Notes (Signed)
Pt now vomiting and still refusing to let staff help him. Notified provider ?

## 2021-09-28 NOTE — ED Notes (Signed)
IV team order placed for 2nd IV access for abx administration d/t current IV infusing nitro at this time. Also for blood draw w/ IV start d/t difficult stick. ?

## 2021-09-28 NOTE — ED Notes (Signed)
Pt placed on bedpan

## 2021-09-28 NOTE — ED Provider Notes (Signed)
?[facility] ?Provider Note ? ? ?CSN: [id number] ?Arrival date & time: 09/28/21  0857 ? ?  ? ?History ? ?Chief Complaint  ?Patient presents with  ? Respiratory Distress  ? ? ?[name] is a 51 y.o. male. ? ?Patient with HTN, DM, ESRD on dialysis Tues, Thurs, Sat, here from dialysis with SOB and respiratory distress. Only had 30 minutes of dialysis today. Was not dialyzed since 5/5. Missed yesterday due to transportation issues. Has had ongoing L sided chest pain x 1 week as well as cough. Increased SOB and L sided chest pain today. Does make some urine.  ?Placed on cpap for EMS, on NRB on arrival.  ? ?The history is provided by the patient.  ? ?  ? ?Home Medications ?Prior to Admission medications   ?Medication Sig Start Date End Date Taking? Authorizing Provider  ?amLODipine (NORVASC) 10 MG tablet Take 1 tablet by mouth once a day 08/27/79   Delora Fuel, MD  ?atorvastatin (LIPITOR) 40 MG tablet Take 1 tablet (40 mg total) by mouth daily. ?Patient not taking: Reported on 1/91/4782 9/56/21   Delora Fuel, MD  ?Blood Glucose Monitoring Suppl (ACCU-CHEK GUIDE) w/Device KIT Use as directed 11/29/20   [name], MD  ?Blood Glucose Monitoring Suppl (TRUE METRIX METER) w/Device KIT Use to measure blood sugar twice a day 02/22/20   [name], MD  ?calcitRIOL (ROCALTROL) 0.25 MCG capsule Take 1 capsule (0.25 mcg total) by mouth daily. ?Patient not taking: Reported on 07/25/6576 4/69/62   Delora Fuel, MD  ?calcium gluconate 500 MG tablet Take 1 tablet (500 mg total) by mouth 3 (three) times daily. ?Patient not taking: Reported on 9/52/8413 2/44/01   Delora Fuel, MD  ?carvedilol (COREG) 12.5 MG tablet Take 1 tablet (12.27m total) by mouth two times a day with a meal. 03/15/24  GDelora Fuel MD  ?glucose blood test strip Use to check fasting blood sugar once daily. 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 Insulin dependent 11/29/20   KAnnita Brod MD  ?hydrALAZINE (APRESOLINE) 25 MG  tablet Take 1 tablet by mouth three times a day 13/66/44  GDelora Fuel MD  ?insulin aspart (NOVOLOG FLEXPEN) 100 UNIT/ML FlexPen Inject 5 Units into the skin 2 (two) times daily with a meal. ?Patient taking differently: Inject 5 Units into the skin 2 (two) times daily as needed for high blood sugar (CBG >200). 02/26/21   [name], DSilver Huguenin MD  ?insulin glargine (LANTUS) 100 UNIT/ML Solostar Pen Inject 10 Units into the skin daily. May substitute with any other long-acting formulary. ?Patient taking differently: Inject 10 Units into the skin daily as needed (CBG >200). May substitute with any other long-acting formulary. 05/13/21   PDarliss Cheney MD  ?Insulin Pen Needle 32G X 4 MM MISC Use with insulin pens 02/26/21   [name], DSilver Huguenin MD  ?sertraline (ZOLOFT) 50 MG tablet Take 1 tablet (50 mg total) by mouth daily. ?Patient not taking: Reported on 07/06/2021 10/34/7442/59/56 GDelora Fuel MD  ?TRUEplus Lancets 28G MISC USE TO MEASURE BLOOD SUGAR TWICE A DAY 11/29/20 11/29/21  KAnnita Brod MD  ?gabapentin (NEURONTIN) 300 MG capsule Take 1 capsule (300 mg total) by mouth 3 (three) times daily. ?Patient not taking: Reported on 07/27/2019 07/29/18 08/30/19  SAzzie Glatter FNP  ?insulin detemir (LEVEMIR FLEXTOUCH) 100 UNIT/ML FlexPen Inject 35 Units into the skin daily. ?Patient not taking: Reported on 02/17/2021 11/29/20 02/24/21  KAnnita Brod MD  ?sildenafil (VIAGRA) 25 MG  tablet Take 1 tablet (25 mg total) by mouth as needed for erectile dysfunction. for erectile dysfunction. ?Patient not taking: Reported on 08/11/2020 10/13/19 11/27/20  [name], FNP  ?sitaGLIPtin (JANUVIA) 25 MG tablet Take 1 tablet (25 mg total) by mouth daily. ?Patient not taking: Reported on 08/11/2020 02/22/20 11/27/20  [name], MD  ?   ? ?Allergies    ?Sulfa antibiotics, Cephalexin, Other, and Bactrim [sulfamethoxazole-trimethoprim]   ? ?Review of Systems   ?Review of Systems  ?Constitutional:  Negative for activity  change and appetite change.  ?HENT:  Negative for congestion.   ?Respiratory:  Positive for cough, chest tightness and shortness of breath.   ?Cardiovascular:  Positive for chest pain.  ?Gastrointestinal:  Negative for abdominal pain, nausea and vomiting.  ?Genitourinary:  Negative for dysuria and hematuria.  ?Skin:  Negative for wound.  ?Neurological:  Negative for dizziness, weakness and headaches.  ? all other systems are negative except as noted in the HPI and PMH.  ? ?Physical Exam ?Updated Vital Signs ?BP (!) 209/110   Resp (!) 26   SpO2 97%  ?Physical Exam ?Vitals and nursing note reviewed.  ?Constitutional:   ?   General: He is in acute distress.  ?   Appearance: He is well-developed. He is ill-appearing.  ?   Comments: Resp distress. ?Speaking in short phrases  ?HENT:  ?   Head: Normocephalic and atraumatic.  ?   Mouth/Throat:  ?   Pharynx: No oropharyngeal exudate.  ?Eyes:  ?   Conjunctiva/sclera: Conjunctivae normal.  ?   Pupils: Pupils are equal, round, and reactive to light.  ?Neck:  ?   Comments: No meningismus. ?Cardiovascular:  ?   Rate and Rhythm: Normal rate and regular rhythm.  ?   Heart sounds: Normal heart sounds. No murmur heard. ?Pulmonary:  ?   Effort: Respiratory distress present.  ?   Breath sounds: Rales present.  ?Abdominal:  ?   Palpations: Abdomen is soft.  ?   Tenderness: There is no abdominal tenderness. There is no guarding or rebound.  ?Musculoskeletal:     ?   General: No tenderness. Normal range of motion.  ?   Cervical back: Normal range of motion and neck supple.  ?   Right lower leg: Edema present.  ?   Left lower leg: Edema present.  ?   Comments: RUE fistula with clamps ? ?+2 DP pulses bilaterally  ?Skin: ?   General: Skin is warm.  ?Neurological:  ?   Mental Status: He is alert and oriented to person, place, and time.  ?   Cranial Nerves: No cranial nerve deficit.  ?   Motor: No abnormal muscle tone.  ?   Coordination: Coordination normal.  ?   Comments:  5/5 strength  throughout. CN 2-12 intact.Equal grip strength.   ?Psychiatric:     ?   Behavior: Behavior normal.  ? ? ?ED Results / Procedures / Treatments   ?Labs ?(all labs ordered are listed, but only abnormal results are displayed) ?Labs Reviewed  ?CBC WITH DIFFERENTIAL/PLATELET - Abnormal; Notable for the following components:  ?    Result Value  ? WBC 11.2 (*)   ? RBC 3.88 (*)   ? Hemoglobin 11.0 (*)   ? HCT 33.7 (*)   ? RDW 15.9 (*)   ? Neutro Abs 9.2 (*)   ? All other components within normal limits  ?I-STAT CHEM 8, ED - Abnormal; Notable for the following components:  ? BUN  47 (*)   ? Creatinine, Ser 10.70 (*)   ? Glucose, Bld 128 (*)   ? Calcium, Ion 0.87 (*)   ? Hemoglobin 11.9 (*)   ? HCT 35.0 (*)   ? All other components within normal limits  ?I-STAT VENOUS BLOOD GAS, ED - Abnormal; Notable for the following components:  ? pCO2, Ven 38.5 (*)   ? pO2, Ven 67 (*)   ? Calcium, Ion 0.81 (*)   ? HCT 35.0 (*)   ? Hemoglobin 11.9 (*)   ? All other components within normal limits  ?RESP PANEL BY RT-PCR (FLU A&B, COVID) ARPGX2  ?BASIC METABOLIC PANEL  ?BRAIN NATRIURETIC PEPTIDE  ?I-STAT ARTERIAL BLOOD GAS, ED  ?TROPONIN I (HIGH SENSITIVITY)  ? ? ?EKG ?EKG Interpretation ? ?Date/Time:  Friday Sep 28 2021 09:03:43 EDT ?Ventricular Rate:  95 ?PR Interval:  144 ?QRS Duration: 161 ?QT Interval:  442 ?QTC Calculation: 556 ?R Axis:   -69 ?Text Interpretation: Sinus rhythm Probable left atrial enlargement Left bundle branch block No significant change was found Confirmed by Ezequiel Essex [phone]) on 09/28/2021 9:06:19 AM ? ?Radiology ?No results found. ? ?Procedures ?Marland KitchenCritical Care ?Performed by: Ezequiel Essex, MD ?Authorized by: Ezequiel Essex, MD  ? ?Critical care provider statement:  ?  Critical care time (minutes):  45 ?  Critical care time was exclusive of:  Separately billable procedures and treating other patients ?  Critical care was necessary to treat or prevent imminent or life-threatening deterioration of the  following conditions:  Respiratory failure and metabolic crisis ?  Critical care was time spent personally by me on the following activities:  Development of treatment plan with patient or surrogate, discussions with c

## 2021-09-28 NOTE — Progress Notes (Signed)
Pharmacy Antibiotic Note ? ?[name] is a 51 y.o. male  w/ ESRD on HD admitted on 09/28/2021 with pneumonia.  Pharmacy has been consulted for vancomycin and piperacillin-tazobactam dosing. ? ?WBC 11.2, afebrile, RR 26, HR 102 ?CXR: consolidative opacity in RML and RLL - findings may represent pneumonia vs. asymmetric edema ? ?Plan: ?Vancomycin 1750mg  IV x 1, followed by 750mg  IV qHD  ?Zosyn 2.25g IV q8h  ?Monitor CBC, temp, and clinical s/sx of infection.  ?F/u cultures, MRSA PCR, and de-escalate antibiotics as able.  ?Continue to follow HD plans and adjust schedule as needed.  ? ?Height: 5\' 7"  (170.2 cm) ?Weight: 80 kg (176 lb 5.9 oz) ?IBW/kg (Calculated) : 66.1 ? ?Temp (24hrs), Avg:98.8 ?F (37.1 ?C), Min:98.8 ?F (37.1 ?C), Max:98.8 ?F (37.1 ?C) ? ?Recent Labs  ?Lab 09/21/21 ?1252 09/21/21 ?1253 09/28/21 ?8416 09/28/21 ?0955  ?WBC 6.6  --  11.2*  --   ?CREATININE  --  12.06* 9.52* 10.70*  ?  ?Estimated Creatinine Clearance: 8.6 mL/min (A) (by C-G formula based on SCr of 10.7 mg/dL (H)).   ? ?Allergies  ?Allergen Reactions  ? Sulfa Antibiotics Rash  ?  Severe rash.   ? Cephalexin Other (See Comments)  ?  Patient with severe drug reaction including exfoliating skin rash and hypotension after being prescribed both cephalexin and sulfamethoxazole-trimethoprim simultaneously. Favor SMX as much more likely culprit as patient had tolerated other cephalosporins in the past, but cannot say with complete certainty that this was not related to cephalexin.   ? Other Other (See Comments)  ?  Patient states he is allergic to the TB test - unknown reaction  ? Bactrim [Sulfamethoxazole-Trimethoprim] Rash  ? ? ?Antimicrobials this admission: ?Zosyn 5/12 >>  ?Vanc 5/12 >>  ? ?Microbiology results: ?5/12 BCx: to be collected ?5/12 MRSA PCR: to be collected ? ?Thank you for allowing pharmacy to be a part of this patient?s care. ? ?Kaleen Mask ?09/28/2021 11:13 AM ? ?

## 2021-09-29 DIAGNOSIS — J9601 Acute respiratory failure with hypoxia: Secondary | ICD-10-CM | POA: Diagnosis not present

## 2021-09-29 DIAGNOSIS — J810 Acute pulmonary edema: Secondary | ICD-10-CM

## 2021-09-29 LAB — BASIC METABOLIC PANEL
Anion gap: 11 (ref 5–15)
BUN: 36 mg/dL — ABNORMAL HIGH (ref 6–20)
CO2: 23 mmol/L (ref 22–32)
Calcium: 7 mg/dL — ABNORMAL LOW (ref 8.9–10.3)
Chloride: 102 mmol/L (ref 98–111)
Creatinine, Ser: 7.05 mg/dL — ABNORMAL HIGH (ref 0.61–1.24)
GFR, Estimated: 9 mL/min — ABNORMAL LOW (ref >60)
Glucose, Bld: 185 mg/dL — ABNORMAL HIGH (ref 70–99)
Potassium: 3.6 mmol/L (ref 3.5–5.1)
Sodium: 136 mmol/L (ref 135–145)

## 2021-09-29 LAB — CBC
HCT: 32.2 % — ABNORMAL LOW (ref 39.0–52.0)
Hemoglobin: 10.3 g/dL — ABNORMAL LOW (ref 13.0–17.0)
MCH: 27.8 pg (ref 26.0–34.0)
MCHC: 32 g/dL (ref 30.0–36.0)
MCV: 87 fL (ref 80.0–100.0)
Platelets: 296 10*3/uL (ref 150–400)
RBC: 3.7 MIL/uL — ABNORMAL LOW (ref 4.22–5.81)
RDW: 15.7 % — ABNORMAL HIGH (ref 11.5–15.5)
WBC: 12.3 10*3/uL — ABNORMAL HIGH (ref 4.0–10.5)
nRBC: 0 % (ref 0.0–0.2)

## 2021-09-29 LAB — MRSA NEXT GEN BY PCR, NASAL: MRSA by PCR Next Gen: NOT DETECTED

## 2021-09-29 LAB — GLUCOSE, CAPILLARY
Glucose-Capillary: 180 mg/dL — ABNORMAL HIGH (ref 70–99)
Glucose-Capillary: 168 mg/dL — ABNORMAL HIGH (ref 70–99)
Glucose-Capillary: 141 mg/dL — ABNORMAL HIGH (ref 70–99)
Glucose-Capillary: 132 mg/dL — ABNORMAL HIGH (ref 70–99)
Glucose-Capillary: 204 mg/dL — ABNORMAL HIGH (ref 70–99)

## 2021-09-29 LAB — RESP PANEL BY RT-PCR (FLU A&B, COVID) ARPGX2
Influenza A by PCR: NEGATIVE
Influenza B by PCR: NEGATIVE
SARS Coronavirus 2 by RT PCR: NEGATIVE

## 2021-09-29 LAB — TROPONIN I (HIGH SENSITIVITY): Troponin I (High Sensitivity): 227 ng/L (ref <18)

## 2021-09-29 MED ORDER — HYDRALAZINE HCL 25 MG PO TABS
25.0000 mg | ORAL_TABLET | Freq: Two times a day (BID) | ORAL | Status: DC
  Administered 2021-09-29 – 2021-10-01 (×5): 25 mg via ORAL
  Filled 2021-09-29 (×5): qty 1

## 2021-09-29 MED ORDER — DARBEPOETIN ALFA 60 MCG/0.3ML IJ SOSY
60.0000 ug | PREFILLED_SYRINGE | INTRAMUSCULAR | Status: DC
  Administered 2021-09-29: 60 ug via INTRAVENOUS
  Filled 2021-09-29: qty 0.3

## 2021-09-29 MED ORDER — INSULIN GLARGINE-YFGN 100 UNIT/ML ~~LOC~~ SOLN
6.0000 [IU] | Freq: Every day | SUBCUTANEOUS | Status: DC
Start: 2021-09-29 — End: 2021-10-01
  Administered 2021-09-29 – 2021-09-30 (×2): 6 [IU] via SUBCUTANEOUS
  Filled 2021-09-29 (×3): qty 0.06

## 2021-09-29 NOTE — Progress Notes (Signed)
Per pt, pt does produce urine; has not urinated yet during shift.  ?

## 2021-09-29 NOTE — Progress Notes (Signed)
?PROGRESS NOTE ? ?Douglas DHALIWAL PJK:[id number] DOB: 05-Dec-1970 DOA: 09/28/2021 ?PCP: Vevelyn Francois, NP ? ? LOS: 1 day  ? ?Brief Narrative / Interim history: ?51 y.o. male with medical history significant of hypertension, diabetes, and end-stage renal disease on dialysis on Tuesdays Thursdays and Saturdays who was sent here from the dialysis center with shortness of breath and respiratory distress.  He had missed his dialysis all last week and has not been dialyzed since May 5.  He was worked in as an outpatient, but 30 minutes into his treatment developed respiratory distress and was brought to the ED.  Nephrology consulted and underwent emergent dialysis on admission ? ?Subjective / 24h Interval events: ?Doing well this morning, still feeling short of breath and dizzy with walking.  Still on oxygen ? ?Assesement and Plan: ?Principal Problem: ?  Acute respiratory failure with hypoxemia (HCC) ?Active Problems: ?  ESRD on dialysis [facility]) ?  Volume overload ?  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  Essential hypertension ?  Mild protein-calorie malnutrition ([place]) ?  CAP (community acquired pneumonia) ?  Acute on chronic combined systolic and diastolic CHF (congestive heart failure) ([place]) ?  Anemia due to chronic kidney disease ?  Acute pulmonary edema (HCC) ? ? ?Principal problem ?Acute hypoxic respiratory failure due to acute pulmonary edema in the setting of end-stage renal disease, acute on chronic combined CHF-admitted yesterday, nephrology consulted.  Initially required BiPAP.  Underwent HD, remains hypoxic today and symptomatic, will undergo repeat dialysis today.  Continue to monitor, wean off to room air as tolerated. ? ?Active problems ?Concern for community-acquired pneumonia-right middle and lower lobes showed some opacities, chest x-ray personally reviewed.  Query whether this is asymmetric edema, however given elevated WBC he was started on antibiotics. ? ?ESRD on HD-nephrology  consulted as above ? ?Acute on chronic combined CHF-most recent 2D echo in 2023 shows an EF of 30-35%.  There is global hypokinesis.  Grade 1 diastolic dysfunction.  RV was normal. ? ?DM1 -resume his long-acting insulin at a lower dose, continue sliding scale.  Monitor CBGs. ? ?CBG (last 3)  ?Recent Labs  ?  09/28/21 ?2151 09/29/21 ?0426 09/29/21 ?0601  ?GLUCAP 94 180* 168*  ? ?Anemia of ESRD -hemoglobin stable, no bleeding ? ?Mild protein calorie malnutrition-noted ? ?Scheduled Meds: ? calcitRIOL  0.25 mcg Oral Daily  ? calcium citrate  200 mg of elemental calcium Oral BID  ? carvedilol  12.5 mg Oral BID WC  ? darbepoetin (ARANESP) injection - DIALYSIS  60 mcg Intravenous Q Sat-HD  ? hydrALAZINE  25 mg Oral BID  ? insulin aspart  0-6 Units Subcutaneous TID WC  ? sodium chloride flush  3 mL Intravenous Q12H  ? sodium chloride flush  3 mL Intravenous Q12H  ? ?Continuous Infusions: ? sodium chloride    ? nitroGLYCERIN Stopped (09/28/21 1559)  ? piperacillin-tazobactam (ZOSYN)  IV 2.25 g (09/29/21 0354)  ? vancomycin    ? ?PRN Meds:.sodium chloride, acetaminophen **OR** acetaminophen, hydrALAZINE, ondansetron **OR** ondansetron (ZOFRAN) IV, oxyCODONE, polyethylene glycol, sodium chloride flush ? ?Diet Orders (From admission, onward)  ? ?  Start     Ordered  ? 09/28/21 1636  Diet renal/carb modified with fluid restriction Diet-HS Snack? Nothing; Fluid restriction: 1500 mL Fluid; Room service appropriate? Yes; Fluid consistency: Thin  Diet effective now       ?Question Answer Comment  ?Diet-HS Snack? Nothing   ?Fluid restriction: 1500 mL Fluid   ?Room service appropriate?  Yes   ?Fluid consistency: Thin   ?  ? 09/28/21 1639  ? ?  ?  ? ?  ? ? ?DVT prophylaxis: SCDs Start: 09/28/21 1633 ? ? ?Lab Results  ?Component Value Date  ? PLT 296 09/29/2021  ? ? ?  Code Status: Full Code ? ?Family Communication: no family at bedside  ? ?Status is: Inpatient ? ?Remains inpatient appropriate because: severity of illness ? ?Level of  care: Progressive ? ?Consultants:  ?Nephrology  ? ?Procedures:  ?none ? ?Microbiology  ?none ? ?Antimicrobials: ?Vanco/Zosyn 5/12 >> ? ? ?Objective: ?Vitals:  ? 09/28/21 2147 09/28/21 2352 09/29/21 0416 09/29/21 0831  ?BP:  121/75 (!) 104/57 100/62  ?Pulse:  84 79 70  ?Resp:  18 18 18   ?Temp:  98.8 ?F (37.1 ?C) 98.5 ?F (36.9 ?C)   ?TempSrc:  Oral Oral Oral  ?SpO2: 97% 98% 94% 95%  ?Weight:   76.1 kg   ?Height:      ? ? ?Intake/Output Summary (Last 24 hours) at 09/29/2021 1157 ?Last data filed at 09/29/2021 0831 ?Gross per 24 hour  ?Intake 1090.48 ml  ?Output 4600 ml  ?Net -3509.52 ml  ? ?Wt Readings from Last 3 Encounters:  ?09/29/21 76.1 kg  ?07/06/21 74.8 kg  ?06/17/21 75 kg  ? ? ?Examination: ? ?Constitutional: NAD ?Eyes: no scleral icterus ?ENMT: Mucous membranes are moist.  ?Neck: normal, supple ?Respiratory: clear to auscultation bilaterally, no wheezing, no crackles. Normal respiratory effort. No accessory muscle use.  ?Cardiovascular: Regular rate and rhythm, no murmurs / rubs / gallops. No LE edema.  ?Abdomen: non distended, no tenderness. Bowel sounds positive.  ?Musculoskeletal: no clubbing / cyanosis.  ?Skin: no rashes ?Neurologic: non focal  ? ? ?Data Reviewed: I have independently reviewed following labs and imaging studies  ? ?CBC ?Recent Labs  ?Lab 09/28/21 ?9470 09/28/21 ?9628 09/28/21 ?3662 09/29/21 ?0430  ?WBC 11.2*  --   --  12.3*  ?HGB 11.0* 11.9* 11.9* 10.3*  ?HCT 33.7* 35.0* 35.0* 32.2*  ?PLT 398  --   --  296  ?MCV 86.9  --   --  87.0  ?MCH 28.4  --   --  27.8  ?MCHC 32.6  --   --  32.0  ?RDW 15.9*  --   --  15.7*  ?LYMPHSABS 0.9  --   --   --   ?MONOABS 0.7  --   --   --   ?EOSABS 0.2  --   --   --   ?BASOSABS 0.0  --   --   --   ? ? ?Recent Labs  ?Lab 09/28/21 ?9476 09/28/21 ?5465 09/28/21 ?0354 09/28/21 ?2255 09/29/21 ?0430  ?NA 137 137 135  --  136  ?K 3.9 3.9 4.8  --  3.6  ?CL 103 103  --   --  102  ?CO2 22  --   --   --  23  ?GLUCOSE 134* 128*  --   --  185*  ?BUN 55* 47*  --   --  36*   ?CREATININE 9.52* 10.70*  --   --  7.05*  ?CALCIUM 6.9*  --   --   --  7.0*  ?LATICACIDVEN  --   --   --  1.5  --   ?BNP 3,254.3*  --   --   --   --   ? ? ?------------------------------------------------------------------------------------------------------------------ ?No results for input(s): CHOL, HDL, LDLCALC, TRIG, CHOLHDL, LDLDIRECT in the last 72 hours. ? ?Lab Results  ?  Component Value Date  ? HGBA1C 7.1 (H) 05/12/2021  ? ?------------------------------------------------------------------------------------------------------------------ ?No results for input(s): TSH, T4TOTAL, T3FREE, THYROIDAB in the last 72 hours. ? ?Invalid input(s): FREET3 ? ?Cardiac Enzymes ?No results for input(s): CKMB, TROPONINI, MYOGLOBIN in the last 168 hours. ? ?Invalid input(s): CK ?------------------------------------------------------------------------------------------------------------------ ?   ?Component Value Date/Time  ? BNP 3,254.3 (H) 09/28/2021 0943  ? ? ?CBG: ?Recent Labs  ?Lab 09/28/21 ?2052 09/28/21 ?2151 09/29/21 ?1610 09/29/21 ?0601  ?GLUCAP 88 94 180* 168*  ? ? ?Recent Results (from the past 240 hour(s))  ?Blood culture (routine x 2)     Status: None (Preliminary result)  ? Collection Time: 09/28/21 10:55 PM  ? Specimen: BLOOD  ?Result Value Ref Range Status  ? Specimen Description BLOOD LEFT ANTECUBITAL  Final  ? Special Requests   Final  ?  BOTTLES DRAWN AEROBIC AND ANAEROBIC Blood Culture adequate volume  ? Culture   Final  ?  NO GROWTH < 12 HOURS ?Performed at [facility], [place] [address] [place], [place] [address] ?  ? Report Status PENDING  Incomplete  ?Blood culture (routine x 2)     Status: None (Preliminary result)  ? Collection Time: 09/28/21 10:55 PM  ? Specimen: BLOOD LEFT HAND  ?Result Value Ref Range Status  ? Specimen Description BLOOD LEFT HAND  Final  ? Special Requests   Final  ?  BOTTLES DRAWN AEROBIC ONLY Blood Culture results may not be optimal due to an inadequate volume of blood  received in culture bottles  ? Culture   Final  ?  NO GROWTH < 12 HOURS ?Performed at [facility], [place] [address] [place], [place] [address] ?  ? Report Status PENDING  Incomplete  ?Resp Panel by RT-PCR (Flu A

## 2021-09-29 NOTE — Progress Notes (Signed)
Pt removed nasal cannula while sleeping. Pulse ox applied. Pt given education concerning oxygen saturation. Oxygen saturation ranging from 74-84% on room air. Pt covered face and verbally refused wearing oxygen. Call bell within reach. Pt denies any further questions or concerns.  ?

## 2021-09-29 NOTE — Progress Notes (Signed)
MRSA & COVID swabs sent to lab.  ?

## 2021-09-29 NOTE — Plan of Care (Signed)
  Problem: Education: Goal: Ability to verbalize understanding of medication therapies will improve Outcome: Progressing   Problem: Activity: Goal: Capacity to carry out activities will improve Outcome: Progressing   

## 2021-09-29 NOTE — Plan of Care (Signed)
°  Problem: Education: °Goal: Ability to demonstrate management of disease process will improve °Outcome: Progressing °  °Problem: Education: °Goal: Ability to verbalize understanding of medication therapies will improve °Outcome: Progressing °  °Problem: Education: °Goal: Individualized Educational Video(s) °Outcome: Progressing °  °Problem: Activity: °Goal: Capacity to carry out activities will improve °Outcome: Progressing °  °

## 2021-09-29 NOTE — Plan of Care (Signed)
?  Problem: Education: ?Goal: Ability to verbalize understanding of medication therapies will improve ?Outcome: Progressing ?  ?Problem: Activity: ?Goal: Capacity to carry out activities will improve ?Outcome: Progressing ?  ?Problem: Cardiac: ?Goal: Ability to achieve and maintain adequate cardiopulmonary perfusion will improve ?Outcome: Progressing ?  ?

## 2021-09-29 NOTE — Progress Notes (Signed)
?[facility] ?Progress Note  ? ?Subjective:  Seen in room - resting quietly, on nasal O2. Dyspnea and HTN improved. HD yesterday with net UF 4.6L. For HD later today to get back on usual schedule. Still has significant LE edema. ? ?Objective ?Vitals:  ? 09/28/21 2147 09/28/21 2352 09/29/21 0416 09/29/21 0831  ?BP:  121/75 (!) 104/57 100/62  ?Pulse:  84 79 70  ?Resp:  18 18 18   ?Temp:  98.8 ?F (37.1 ?C) 98.5 ?F (36.9 ?C)   ?TempSrc:  Oral Oral Oral  ?SpO2: 97% 98% 94% 95%  ?Weight:   76.1 kg   ?Height:      ? ?Physical Exam ?General: Well appearing, NAD. Nasal O2 in place ?Heart: RRR; no murmur ?Lungs: CTAB; no wheezing or rales ?Abdomen: soft ?Extremities: 2-3+ BLE edema to knees, hip edema has resolved ?Dialysis Access: R forearm AVF + bruit ? ?Additional Objective ?Labs: ?Basic Metabolic Panel: ?Recent Labs  ?Lab 09/28/21 ?0973 09/28/21 ?5329 09/28/21 ?9242 09/29/21 ?0430  ?NA 137 137 135 136  ?K 3.9 3.9 4.8 3.6  ?CL 103 103  --  102  ?CO2 22  --   --  23  ?GLUCOSE 134* 128*  --  185*  ?BUN 55* 47*  --  36*  ?CREATININE 9.52* 10.70*  --  7.05*  ?CALCIUM 6.9*  --   --  7.0*  ? ?CBC: ?Recent Labs  ?Lab 09/28/21 ?6834 09/28/21 ?1962 09/28/21 ?2297 09/29/21 ?0430  ?WBC 11.2*  --   --  12.3*  ?NEUTROABS 9.2*  --   --   --   ?HGB 11.0* 11.9* 11.9* 10.3*  ?HCT 33.7* 35.0* 35.0* 32.2*  ?MCV 86.9  --   --  87.0  ?PLT 398  --   --  296  ? ?Blood Culture ?   ?Component Value Date/Time  ? SDES BLOOD LEFT ANTECUBITAL 09/28/2021 2255  ? SDES BLOOD LEFT HAND 09/28/2021 2255  ? SPECREQUEST  09/28/2021 2255  ?  BOTTLES DRAWN AEROBIC AND ANAEROBIC Blood Culture adequate volume  ? SPECREQUEST  09/28/2021 2255  ?  BOTTLES DRAWN AEROBIC ONLY Blood Culture results may not be optimal due to an inadequate volume of blood received in culture bottles  ? CULT  09/28/2021 2255  ?  NO GROWTH < 12 HOURS ?Performed at [facility], [place] [address] [place], [place] [address] ?  ? CULT  09/28/2021 2255  ?  NO GROWTH < 12  HOURS ?Performed at [facility], [place] [address] [name], [place] [address] ?  ? REPTSTATUS PENDING 09/28/2021 2255  ? REPTSTATUS PENDING 09/28/2021 2255  ? ?Studies/Results: ?DG Chest Portable 1 View ? ?Result Date: 09/28/2021 ?CLINICAL DATA:  Shortness of breath right from dialysis. Respiratory distress EXAM: PORTABLE CHEST 1 VIEW COMPARISON:  Chest radiograph Sep 20, 2021 FINDINGS: Cardiomegaly. Consolidative opacity in the right middle and lower lobe. Patchy bilateral interstitial opacities. No visible pleural effusion or pneumothorax. Thoracic spondylosis with degenerative changes shoulders. IMPRESSION: 1. Consolidative opacity in the right middle and lower lobe. Findings may represent pneumonia versus asymmetric edema. 2. Cardiomegaly with patchy interstitial opacities likely reflects interstitial edema. Electronically Signed   By: Dahlia Bailiff M.D.   On: 09/28/2021 10:59  ? ?DG Abd Portable 2 Views ? ?Result Date: 09/28/2021 ?CLINICAL DATA:  Abdominal pain EXAM: PORTABLE ABDOMEN - 2 VIEW COMPARISON:  None Available. FINDINGS: The bowel gas pattern is normal. There is no evidence of free air. Centralized bowel loops may reflect ascites. IMPRESSION:  1. Nonobstructive bowel gas pattern. 2. Centralized bowel loops may reflect ascites. Electronically Signed   By: Dahlia Bailiff M.D.   On: 09/28/2021 10:59   ? ?Medications: ? sodium chloride    ? nitroGLYCERIN Stopped (09/28/21 1559)  ? piperacillin-tazobactam (ZOSYN)  IV 2.25 g (09/29/21 0354)  ? vancomycin    ? ? amLODipine  10 mg Oral Daily  ? calcitRIOL  0.25 mcg Oral Daily  ? calcium citrate  200 mg of elemental calcium Oral BID  ? carvedilol  12.5 mg Oral BID WC  ? hydrALAZINE  25 mg Oral TID  ? insulin aspart  0-6 Units Subcutaneous TID WC  ? sodium chloride flush  3 mL Intravenous Q12H  ? sodium chloride flush  3 mL Intravenous Q12H  ? ? ?Dialysis Orders: ?TTS at [facility] -> last HD at [facility] on 5/5, prior to that was 08/30/21 at his unit. ?4hr, 450/A1.5, EDW  71kg, 2K/3Ca, AVG, heparin 2000 unit bolus ?- Mircera 62mcg IV q 4 weeks - has been > 1 mo since given. ?- Calcitriol 0.21mcg PO q HD, likely > 61mo since given. ?  ?Assessment/Plan: ? Hypoxia/Resp distress/pulm edema: S/p HD yesterday with 4.6L off. Off BiPAP now, on nasal O2. Still with edema -> HD again today for add'l volume removal. ??Possible pneumonia: Started on Vanc/Zosyn. Per primary. ? ESRD:  Usual TTS schedule -> HD yesterday, again today to get back on schedule and for further volume removal. ? Hypertension/volume: BP sky high with pulm edema yesterday - BP low today. Will d/c amlodipine and change hydralazine to BID. Continue Coreg/hydralazine for now (given late last night). ? Anemia: Hgb 10.3, can give low dose ESA with HD today. ? Metabolic bone disease: CorrCa not too bad, will use high Ca bath and resume VDRA. ?T2DM ? ?Veneta Penton, PA-C ?09/29/2021, 9:37 AM  ?[facility] ? ? ? ?

## 2021-09-29 NOTE — Progress Notes (Signed)
Pt resting without distress. Placed pt on 5L via nasal cannula. Oxygen saturation 96%. Pulse ox placed on pt. Call bell within reach.  ?

## 2021-09-30 ENCOUNTER — Inpatient Hospital Stay (HOSPITAL_COMMUNITY): Payer: Medicaid Other

## 2021-09-30 DIAGNOSIS — J9601 Acute respiratory failure with hypoxia: Secondary | ICD-10-CM | POA: Diagnosis not present

## 2021-09-30 LAB — GLUCOSE, CAPILLARY
Glucose-Capillary: 206 mg/dL — ABNORMAL HIGH (ref 70–99)
Glucose-Capillary: 114 mg/dL — ABNORMAL HIGH (ref 70–99)
Glucose-Capillary: 214 mg/dL — ABNORMAL HIGH (ref 70–99)
Glucose-Capillary: 200 mg/dL — ABNORMAL HIGH (ref 70–99)

## 2021-09-30 MED ORDER — VANCOMYCIN HCL 750 MG/150ML IV SOLN
750.0000 mg | INTRAVENOUS | Status: DC
  Filled 2021-09-30: qty 150

## 2021-09-30 MED ORDER — HEPARIN SODIUM (PORCINE) 1000 UNIT/ML DIALYSIS: 2000.0000 [IU] | Freq: Once | INTRAMUSCULAR | Status: DC

## 2021-09-30 NOTE — Progress Notes (Signed)
Patient refused labs to be drawn.

## 2021-09-30 NOTE — Progress Notes (Signed)
?PROGRESS NOTE ? ?[name] FFM:[id number] DOB: [birth year] DOA: 09/28/2021 ?PCP: Vevelyn Francois, NP ? ? LOS: 2 days  ? ?Brief Narrative / Interim history: ?51 y.o. male with medical history significant of hypertension, diabetes, and end-stage renal disease on dialysis on Tuesdays Thursdays and Saturdays who was sent here from the dialysis center with shortness of breath and respiratory distress.  He had missed his dialysis all last week and has not been dialyzed since May 5.  He was worked in as an outpatient, but 30 minutes into his treatment developed respiratory distress and was brought to the ED.  Nephrology consulted and underwent emergent dialysis on admission ? ?Subjective / 24h Interval events: ?Remains hypoxic after his second HD last night.  Denies any shortness of breath this morning. ? ?Assesement and Plan: ?Principal Problem: ?  Acute respiratory failure with hypoxemia (HCC) ?Active Problems: ?  ESRD on dialysis [facility]) ?  Volume overload ?  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  Essential hypertension ?  Mild protein-calorie malnutrition ([place]) ?  CAP (community acquired pneumonia) ?  Acute on chronic combined systolic and diastolic CHF (congestive heart failure) ([place]) ?  Anemia due to chronic kidney disease ?  Acute pulmonary edema (HCC) ? ? ?Principal problem ?Acute hypoxic respiratory failure due to acute pulmonary edema in the setting of end-stage renal disease, acute on chronic combined CHF-nephrology consulted on admission.  Initially required BiPAP.  Underwent HD x2, however remains hypoxic.  I wonder how much he is pneumonia is playing a role.  Repeat dialysis tomorrow ? ?Active problems ?Concern for community-acquired pneumonia-right middle and lower lobes showed some opacities, chest x-ray personally reviewed.  Persistent on x-ray after dialysis, continue antibiotics ? ?ESRD on HD-nephrology consulted as above.  Repeat dialysis tomorrow ? ?Acute on chronic combined  CHF-most recent 2D echo in 2023 shows an EF of 30-35%.  There is global hypokinesis.  Grade 1 diastolic dysfunction.  RV was normal. ? ?DM1 -resume his long-acting insulin at a lower dose, continue sliding scale.  CBGs overall acceptable ? ?CBG (last 3)  ?Recent Labs  ?  09/29/21 ?2122 09/30/21 ?0609 09/30/21 ?1111  ?GLUCAP 204* 206* 114*  ? ? ?Anemia of ESRD -hemoglobin stable, no bleeding ? ?Mild protein calorie malnutrition-noted ? ?Scheduled Meds: ? calcitRIOL  0.25 mcg Oral Daily  ? calcium citrate  200 mg of elemental calcium Oral BID  ? carvedilol  12.5 mg Oral BID WC  ? darbepoetin (ARANESP) injection - DIALYSIS  60 mcg Intravenous Q Sat-HD  ? hydrALAZINE  25 mg Oral BID  ? insulin aspart  0-6 Units Subcutaneous TID WC  ? insulin glargine-yfgn  6 Units Subcutaneous Daily  ? sodium chloride flush  3 mL Intravenous Q12H  ? sodium chloride flush  3 mL Intravenous Q12H  ? ?Continuous Infusions: ? sodium chloride    ? nitroGLYCERIN Stopped (09/28/21 1559)  ? piperacillin-tazobactam (ZOSYN)  IV 2.25 g (09/30/21 0456)  ? vancomycin 750 mg (09/29/21 1714)  ? [START ON 10/01/2021] vancomycin    ? ?PRN Meds:.sodium chloride, acetaminophen **OR** acetaminophen, hydrALAZINE, ondansetron **OR** ondansetron (ZOFRAN) IV, oxyCODONE, polyethylene glycol, sodium chloride flush ? ?Diet Orders (From admission, onward)  ? ?  Start     Ordered  ? 09/28/21 1636  Diet renal/carb modified with fluid restriction Diet-HS Snack? Nothing; Fluid restriction: 1500 mL Fluid; Room service appropriate? Yes; Fluid consistency: Thin  Diet effective now       ?Question Answer Comment  ?  Diet-HS Snack? Nothing   ?Fluid restriction: 1500 mL Fluid   ?Room service appropriate? Yes   ?Fluid consistency: Thin   ?  ? 09/28/21 1639  ? ?  ?  ? ?  ? ? ?DVT prophylaxis: SCDs Start: 09/28/21 1633 ? ? ?Lab Results  ?Component Value Date  ? PLT 296 09/29/2021  ? ? ?  Code Status: Full Code ? ?Family Communication: no family at bedside  ? ?Status is:  Inpatient ? ?Remains inpatient appropriate because: severity of illness ? ?Level of care: Progressive ? ?Consultants:  ?Nephrology  ? ?Procedures:  ?none ? ?Microbiology  ?none ? ?Antimicrobials: ?Vanco/Zosyn 5/12 >> ? ? ?Objective: ?Vitals:  ? 09/29/21 2200 09/29/21 2206 09/30/21 0321 09/30/21 1113  ?BP:   135/83 (!) 149/81  ?Pulse:   72 76  ?Resp:   16 18  ?Temp:   98.2 ?F (36.8 ?C) 98.4 ?F (36.9 ?C)  ?TempSrc:   Oral Oral  ?SpO2: 96% 100% 100% 100%  ?Weight:   72.1 kg   ?Height:      ? ? ?Intake/Output Summary (Last 24 hours) at 09/30/2021 1145 ?Last data filed at 09/30/2021 0900 ?Gross per 24 hour  ?Intake 1167.43 ml  ?Output 3960 ml  ?Net -2792.57 ml  ? ? ?Wt Readings from Last 3 Encounters:  ?09/30/21 72.1 kg  ?07/06/21 74.8 kg  ?06/17/21 75 kg  ? ? ?Examination: ? ?Constitutional: NAD ?Eyes: lids and conjunctivae normal, no scleral icterus ?ENMT: mmm ?Neck: normal, supple ?Respiratory: clear to auscultation bilaterally, no wheezing, no crackles.  ?Cardiovascular: Regular rate and rhythm, no murmurs / rubs / gallops. No LE edema. ?Abdomen: soft, no distention, no tenderness. Bowel sounds positive.  ?Skin: no rashes ?Neurologic: no focal deficits, equal strength ? ? ?Data Reviewed: I have independently reviewed following labs and imaging studies  ? ?CBC ?Recent Labs  ?Lab 09/28/21 ?5329 09/28/21 ?9242 09/28/21 ?6834 09/29/21 ?0430  ?WBC 11.2*  --   --  12.3*  ?HGB 11.0* 11.9* 11.9* 10.3*  ?HCT 33.7* 35.0* 35.0* 32.2*  ?PLT 398  --   --  296  ?MCV 86.9  --   --  87.0  ?MCH 28.4  --   --  27.8  ?MCHC 32.6  --   --  32.0  ?RDW 15.9*  --   --  15.7*  ?LYMPHSABS 0.9  --   --   --   ?MONOABS 0.7  --   --   --   ?EOSABS 0.2  --   --   --   ?BASOSABS 0.0  --   --   --   ? ? ? ?Recent Labs  ?Lab 09/28/21 ?1962 09/28/21 ?2297 09/28/21 ?9892 09/28/21 ?2255 09/29/21 ?0430  ?NA 137 137 135  --  136  ?K 3.9 3.9 4.8  --  3.6  ?CL 103 103  --   --  102  ?CO2 22  --   --   --  23  ?GLUCOSE 134* 128*  --   --  185*  ?BUN 55* 47*   --   --  36*  ?CREATININE 9.52* 10.70*  --   --  7.05*  ?CALCIUM 6.9*  --   --   --  7.0*  ?LATICACIDVEN  --   --   --  1.5  --   ?BNP 3,254.3*  --   --   --   --   ? ? ? ?------------------------------------------------------------------------------------------------------------------ ?No results for input(s): CHOL, HDL, LDLCALC, TRIG, CHOLHDL, LDLDIRECT in the  last 72 hours. ? ?Lab Results  ?Component Value Date  ? HGBA1C 7.1 (H) 05/12/2021  ? ?------------------------------------------------------------------------------------------------------------------ ?No results for input(s): TSH, T4TOTAL, T3FREE, THYROIDAB in the last 72 hours. ? ?Invalid input(s): FREET3 ? ?Cardiac Enzymes ?No results for input(s): CKMB, TROPONINI, MYOGLOBIN in the last 168 hours. ? ?Invalid input(s): CK ?------------------------------------------------------------------------------------------------------------------ ?   ?Component Value Date/Time  ? BNP 3,254.3 (H) 09/28/2021 0943  ? ? ?CBG: ?Recent Labs  ?Lab 09/29/21 ?1212 09/29/21 ?1844 09/29/21 ?2122 09/30/21 ?1829 09/30/21 ?1111  ?GLUCAP 141* 132* 204* 206* 114*  ? ? ? ?Recent Results (from the past 240 hour(s))  ?Blood culture (routine x 2)     Status: None (Preliminary result)  ? Collection Time: 09/28/21 10:55 PM  ? Specimen: BLOOD  ?Result Value Ref Range Status  ? Specimen Description BLOOD LEFT ANTECUBITAL  Final  ? Special Requests   Final  ?  BOTTLES DRAWN AEROBIC AND ANAEROBIC Blood Culture adequate volume  ? Culture   Final  ?  NO GROWTH 2 DAYS ?Performed at [facility], [place] [address], [place], [place] [address] ?  ? Report Status PENDING  Incomplete  ?Blood culture (routine x 2)     Status: None (Preliminary result)  ? Collection Time: 09/28/21 10:55 PM  ? Specimen: BLOOD LEFT HAND  ?Result Value Ref Range Status  ? Specimen Description BLOOD LEFT HAND  Final  ? Special Requests   Final  ?  BOTTLES DRAWN AEROBIC ONLY Blood Culture results may not be optimal due  to an inadequate volume of blood received in culture bottles  ? Culture   Final  ?  NO GROWTH 2 DAYS ?Performed at [facility], [place] [address], [place], [place] [address] ?  ? Report Status PENDING  Incomplete

## 2021-09-30 NOTE — Progress Notes (Signed)
?[name] ?Progress Note  ? ?Subjective:  Seen in room - still requiring O2. S/p serial HD with 8.5L off over the past 2 days. Repeat CXR this AM with improving pulm edema and RML pneumonia. No CP or other new symptoms. ? ?Objective ?Vitals:  ? 09/29/21 2151 09/29/21 2200 09/29/21 2206 09/30/21 0321  ?BP:    135/83  ?Pulse:    72  ?Resp:    16  ?Temp:    98.2 ?F (36.8 ?C)  ?TempSrc:    Oral  ?SpO2: (!) 81% 96% 100% 100%  ?Weight:    72.1 kg  ?Height:      ? ?Physical Exam ?General: Well appearing, NAD. Nasal O2 in place ?Heart: RRR; no murmur ?Lungs: CTA throughout except ?rhonchi mid R lung ?Abdomen: soft ?Extremities: 2+ BLE edema, improving since admit ?Dialysis Access: R forearm AVF + bruit ?  ?Additional Objective ?Labs: ?Basic Metabolic Panel: ?Recent Labs  ?Lab 09/28/21 ?5329 09/28/21 ?9242 09/28/21 ?6834 09/29/21 ?0430  ?NA 137 137 135 136  ?K 3.9 3.9 4.8 3.6  ?CL 103 103  --  102  ?CO2 22  --   --  23  ?GLUCOSE 134* 128*  --  185*  ?BUN 55* 47*  --  36*  ?CREATININE 9.52* 10.70*  --  7.05*  ?CALCIUM 6.9*  --   --  7.0*  ? ?CBC: ?Recent Labs  ?Lab 09/28/21 ?1962 09/28/21 ?2297 09/28/21 ?9892 09/29/21 ?0430  ?WBC 11.2*  --   --  12.3*  ?NEUTROABS 9.2*  --   --   --   ?HGB 11.0* 11.9* 11.9* 10.3*  ?HCT 33.7* 35.0* 35.0* 32.2*  ?MCV 86.9  --   --  87.0  ?PLT 398  --   --  296  ? ?CBG: ?Recent Labs  ?Lab 09/29/21 ?0601 09/29/21 ?1212 09/29/21 ?1844 09/29/21 ?2122 09/30/21 ?1194  ?GLUCAP 168* 141* 132* 204* 206*  ? ?Studies/Results: ?DG CHEST PORT 1 VIEW ? ?Result Date: 09/30/2021 ?CLINICAL DATA:  Respiratory failure for hypoxemia. EXAM: PORTABLE CHEST 1 VIEW COMPARISON:  09/28/2021 FINDINGS: Stable cardiac enlargement. No signs of pleural effusion. Airspace consolidation within the right mid lung is decreased from previous exam. Left lung appears clear. Interstitial edema pattern is also improved in the interval. IMPRESSION: Interval improvement in right midlung airspace opacity and mild  interstitial edema. Electronically Signed   By: Kerby Moors M.D.   On: 09/30/2021 08:36  ? ?DG Chest Portable 1 View ? ?Result Date: 09/28/2021 ?CLINICAL DATA:  Shortness of breath right from dialysis. Respiratory distress EXAM: PORTABLE CHEST 1 VIEW COMPARISON:  Chest radiograph Sep 20, 2021 FINDINGS: Cardiomegaly. Consolidative opacity in the right middle and lower lobe. Patchy bilateral interstitial opacities. No visible pleural effusion or pneumothorax. Thoracic spondylosis with degenerative changes shoulders. IMPRESSION: 1. Consolidative opacity in the right middle and lower lobe. Findings may represent pneumonia versus asymmetric edema. 2. Cardiomegaly with patchy interstitial opacities likely reflects interstitial edema. Electronically Signed   By: Dahlia Bailiff M.D.   On: 09/28/2021 10:59  ? ?DG Abd Portable 2 Views ? ?Result Date: 09/28/2021 ?CLINICAL DATA:  Abdominal pain EXAM: PORTABLE ABDOMEN - 2 VIEW COMPARISON:  None Available. FINDINGS: The bowel gas pattern is normal. There is no evidence of free air. Centralized bowel loops may reflect ascites. IMPRESSION: 1. Nonobstructive bowel gas pattern. 2. Centralized bowel loops may reflect ascites. Electronically Signed   By: Dahlia Bailiff M.D.   On: 09/28/2021 10:59   ? ?Medications: ? sodium chloride    ?  nitroGLYCERIN Stopped (09/28/21 1559)  ? piperacillin-tazobactam (ZOSYN)  IV 2.25 g (09/30/21 0456)  ? vancomycin 750 mg (09/29/21 1714)  ? ? calcitRIOL  0.25 mcg Oral Daily  ? calcium citrate  200 mg of elemental calcium Oral BID  ? carvedilol  12.5 mg Oral BID WC  ? darbepoetin (ARANESP) injection - DIALYSIS  60 mcg Intravenous Q Sat-HD  ? hydrALAZINE  25 mg Oral BID  ? insulin aspart  0-6 Units Subcutaneous TID WC  ? insulin glargine-yfgn  6 Units Subcutaneous Daily  ? sodium chloride flush  3 mL Intravenous Q12H  ? sodium chloride flush  3 mL Intravenous Q12H  ? ? ?Dialysis Orders: ?TTS at [facility] -> last HD at [facility] on 5/5, prior to that was 08/30/21 at  his unit. ?4hr, 450/A1.5, EDW 71kg, 2K/3Ca, AVG, heparin 2000 unit bolus ?- Mircera 86mcg IV q 4 weeks - has been > 1 mo since given. ?- Calcitriol 0.25mcg PO q HD, likely > 19mo since given. ?  ?Assessment/Plan: ? Hypoxia/Resp distress/pulm edema: S/p HD 5/12, 5/13 with combined 8.5L off. Still requiring O2. Repeat CXR this AM looks much improved from admit. ? RML pneumonia: On Vanc/Zosyn. Per primary. ? ESRD: Usual TTS schedule  -> planning extra HD Monday, then back to usual TTS schedule, hopefully as outpatient. ? Hypertension/volume: BP controlled today.  Amlodipine was d/c'd and hydralazine reduced to BID on 5/13. Continue Coreg/hydralazine for now. ? Anemia: Hgb 10.3, given Aranesp 46mcg on 5/13. ? Metabolic bone disease: CorrCa not too bad, will use high Ca bath and continue VDRA. Will check Phos in AM. ?T2DM ? ?Veneta Penton, PA-C ?09/30/2021, 8:42 AM  ?[facility] ? ? ? ?

## 2021-09-30 NOTE — Plan of Care (Signed)
?  Problem: Education: ?Goal: Ability to demonstrate management of disease process will improve ?Outcome: Progressing ?  ?Problem: Activity: ?Goal: Capacity to carry out activities will improve ?Outcome: Progressing ?  ?Problem: Cardiac: ?Goal: Ability to achieve and maintain adequate cardiopulmonary perfusion will improve ?Outcome: Progressing ?  ?

## 2021-10-01 ENCOUNTER — Other Ambulatory Visit: Payer: Self-pay

## 2021-10-01 DIAGNOSIS — N186 End stage renal disease: Secondary | ICD-10-CM

## 2021-10-01 DIAGNOSIS — Z992 Dependence on renal dialysis: Secondary | ICD-10-CM

## 2021-10-01 DIAGNOSIS — I10 Essential (primary) hypertension: Secondary | ICD-10-CM

## 2021-10-01 DIAGNOSIS — J9601 Acute respiratory failure with hypoxia: Secondary | ICD-10-CM | POA: Diagnosis not present

## 2021-10-01 DIAGNOSIS — R7309 Other abnormal glucose: Secondary | ICD-10-CM

## 2021-10-01 LAB — GLUCOSE, CAPILLARY: Glucose-Capillary: 173 mg/dL — ABNORMAL HIGH (ref 70–99)

## 2021-10-01 MED ORDER — DOXYCYCLINE HYCLATE 100 MG PO TABS
100.0000 mg | ORAL_TABLET | Freq: Two times a day (BID) | ORAL | Status: DC
  Administered 2021-10-01: 100 mg via ORAL
  Filled 2021-10-01: qty 1

## 2021-10-01 MED ORDER — DOXYCYCLINE HYCLATE 100 MG PO TABS
100.0000 mg | ORAL_TABLET | Freq: Two times a day (BID) | ORAL | 0 refills | Status: AC
  Filled 2021-10-01: qty 8, 4d supply, fill #0

## 2021-10-01 NOTE — Progress Notes (Signed)
Patient is non compliant in wearing the nasal cannula. Education given. Needs further teachings.  ?

## 2021-10-01 NOTE — Patient Outreach (Signed)
[facility]) Care Management ? ?10/01/2021 ? ?NORVILLE DANI ?03-23-1971 ?[id number] ? ?Managed medicaid: [employer] ? ?Primary Care Provider:  Vevelyn Francois, NP ? ? ?High ED utilizer ? ?Referral requested:  Assign for post hospital follow up ? ?Natividad Brood, RN BSN CCM ?[facility] Liaison ? [phone] business mobile phone ?Toll free office [phone]  ?Fax number: [phone] ?[email or web address] ? ? ? ? ? ? ?

## 2021-10-01 NOTE — Progress Notes (Signed)
Pt for d/c this am. Spoke to pt's RN who reports that pt was d/c with plan for pt to resume at out-pt clinic tomorrow. Contacted [facility] to make clinic aware of pt's d/c today and that pt should resume care tomorrow.  ? ?Melven Sartorius ?Renal Navigator ?[phone] ?

## 2021-10-01 NOTE — TOC Transition Note (Signed)
Transition of Care (TOC) - CM/SW Discharge Note ? ? ?Patient Details  ?Name: [name] ?MRN: [id number] ?Date of Birth: [birth year] ? ?Transition of Care (TOC) CM/SW Contact:  ?Zenon Mayo, RN ?Phone Number: ?10/01/2021, 9:48 AM ? ? ?Clinical Narrative:    ?Patient for dc today, he has no needs  ? ? ?  ?  ? ? ?Patient Goals and CMS Choice ?  ?  ?  ? ?Discharge Placement ?  ?           ?  ?  ?  ?  ? ?Discharge Plan and Services ?  ?  ?           ?  ?  ?  ?  ?  ?  ?  ?  ?  ?  ? ?Social Determinants of Health (SDOH) Interventions ?  ? ? ?Readmission Risk Interventions ? ?  02/19/2021  ?  3:52 PM  ?Readmission Risk Prevention Plan  ?Transportation Screening Complete  ?Medication Review Press photographer) Complete  ?PCP or Specialist appointment within 3-5 days of discharge Complete  ?[place] or Home Care Consult Complete  ?SW Recovery Care/Counseling Consult Complete  ?Palliative Care Screening Not Applicable  ?[place] Not Applicable  ? ? ? ? ? ?

## 2021-10-01 NOTE — Discharge Instructions (Signed)
Follow with Douglas Francois, NP in 5-7 days ? ?Please get a complete blood count and chemistry panel checked by your Primary MD at your next visit, and again as instructed by your Primary MD. Please get your medications reviewed and adjusted by your Primary MD. ? ?Please request your Primary MD to go over all Hospital Tests and Procedure/Radiological results at the follow up, please get all Hospital records sent to your Prim MD by signing hospital release before you go home. ? ?In some cases, there will be blood work, cultures and biopsy results pending at the time of your discharge. Please request that your primary care M.D. goes through all the records of your hospital data and follows up on these results. ? ?If you had Pneumonia of Lung problems at the Hospital: ?Please get a 2 view Chest X ray done in 6-8 weeks after hospital discharge or sooner if instructed by your Primary MD. ? ?If you have Congestive Heart Failure: ?Please call your Cardiologist or Primary MD anytime you have any of the following symptoms:  ?1) 3 pound weight gain in 24 hours or 5 pounds in 1 week  ?2) shortness of breath, with or without a dry hacking cough  ?3) swelling in the hands, feet or stomach  ?4) if you have to sleep on extra pillows at night in order to breathe ? ?Follow cardiac low salt diet and 1.5 lit/day fluid restriction. ? ?If you have diabetes ?Accuchecks 4 times/day, Once in AM empty stomach and then before each meal. ?Log in all results and show them to your primary doctor at your next visit. ?If any glucose reading is under 80 or above 300 call your primary MD immediately. ? ?If you have Seizure/Convulsions/Epilepsy: ?Please do not drive, operate heavy machinery, participate in activities at heights or participate in high speed sports until you have seen by Primary MD or a Neurologist and advised to do so again. ?Per [facility] statutes, patients with seizures are not allowed to drive until they have been  seizure-free for six months.  ?Use caution when using heavy equipment or power tools. Avoid working on ladders or at heights. Take showers instead of baths. Ensure the water temperature is not too high on the home water heater. Do not go swimming alone. Do not lock yourself in a room alone (i.e. bathroom). When caring for infants or small children, sit down when holding, feeding, or changing them to minimize risk of injury to the child in the event you have a seizure. Maintain good sleep hygiene. Avoid alcohol.  ? ?If you had Gastrointestinal Bleeding: ?Please ask your Primary MD to check a complete blood count within one week of discharge or at your next visit. Your endoscopic/colonoscopic biopsies that are pending at the time of discharge, will also need to followed by your Primary MD. ? ?Get Medicines reviewed and adjusted. ?Please take all your medications with you for your next visit with your Primary MD ? ?Please request your Primary MD to go over all hospital tests and procedure/radiological results at the follow up, please ask your Primary MD to get all Hospital records sent to his/her office. ? ?If you experience worsening of your admission symptoms, develop shortness of breath, life threatening emergency, suicidal or homicidal thoughts you must seek medical attention immediately by calling 911 or calling your MD immediately  if symptoms less severe. ? ?You must read complete instructions/literature along with all the possible adverse reactions/side effects for all the Medicines you  take and that have been prescribed to you. Take any new Medicines after you have completely understood and accpet all the possible adverse reactions/side effects.  ? ?Do not drive or operate heavy machinery when taking Pain medications.  ? ?Do not take more than prescribed Pain, Sleep and Anxiety Medications ? ?Special Instructions: If you have smoked or chewed Tobacco  in the last 2 yrs please stop smoking, stop any regular  Alcohol  and or any Recreational drug use. ? ?Wear Seat belts while driving. ? ?Please note ?You were cared for by a hospitalist during your hospital stay. If you have any questions about your discharge medications or the care you received while you were in the hospital after you are discharged, you can call the unit and asked to speak with the hospitalist on call if the hospitalist that took care of you is not available. Once you are discharged, your primary care physician will handle any further medical issues. Please note that NO REFILLS for any discharge medications will be authorized once you are discharged, as it is imperative that you return to your primary care physician (or establish a relationship with a primary care physician if you do not have one) for your aftercare needs so that they can reassess your need for medications and monitor your lab values. ? ?You can reach the hospitalist office at phone [phone] or fax [phone] ?  ?If you do not have a primary care physician, you can call [phone] for a physician referral. ? ?Activity: As tolerated with Full fall precautions use walker/cane & assistance as needed ? ?  ?Diet: renal ? ?Disposition Home  ? ?

## 2021-10-01 NOTE — Progress Notes (Signed)
SATURATION QUALIFICATIONS: (This note is used to comply with regulatory documentation for home oxygen) ? ?Patient Saturations on Room Air at Rest = 92% ? ?Patient Saturations on Room Air while Ambulating = 88% ? ?Patient Saturations on 2 Liters of oxygen while Ambulating = 97% ? ?Please briefly explain why patient needs home oxygen: pt has 02 at home  ?

## 2021-10-01 NOTE — Progress Notes (Signed)
Patient refused labs to be drawn. Will follow up.  ?

## 2021-10-01 NOTE — Discharge Summary (Signed)
? ?Physician Discharge Summary  ?[name] XFG:[id number] DOB: August 09, 1970 DOA: 09/28/2021 ? ?PCP: Vevelyn Francois, NP ? ?Admit date: 09/28/2021 ?Discharge date: 10/01/2021 ? ?Admitted From: home ?Disposition:  home ? ?Recommendations for Outpatient Follow-up:  ?Follow up with PCP in 1-2 weeks ?HD tomorrow as per schedule ? ?Home Health: none ?Equipment/Devices: none ? ?Discharge Condition: stable ?CODE STATUS: Full code ?Diet Orders (From admission, onward)  ? ?  Start     Ordered  ? 09/28/21 1636  Diet renal/carb modified with fluid restriction Diet-HS Snack? Nothing; Fluid restriction: 1500 mL Fluid; Room service appropriate? Yes; Fluid consistency: Thin  Diet effective now       ?Question Answer Comment  ?Diet-HS Snack? Nothing   ?Fluid restriction: 1500 mL Fluid   ?Room service appropriate? Yes   ?Fluid consistency: Thin   ?  ? 09/28/21 1639  ? ?  ?  ? ?  ? ? ?HPI: Per admitting MD, ?[name] is a 51 y.o. male with medical history significant of hypertension, diabetes, and end-stage renal disease on dialysis on Tuesdays Thursdays and Saturdays who was sent here from the dialysis center with shortness of breath and respiratory distress.  He had missed his dialysis all last week and has not been dialyzed since May 5.  He was worked in today but was only on the machine for about 30 minutes when he developed respiratory distress.  He had difficulty with transportation issues last week.  He was trying to give me some history where he stated that the transportation did come but he was unable to finish his sentence due to shortness of breath.  He was on BiPAP when I saw him.  He has had consistently ongoing left-sided chest pain for a week as well as cough and increased shortness of breath.  He does make some urine and was given Lasix in the emergency department with little effect.  Patient placed on CPAP then nonrebreather however was unable to tolerate it and required BiPAP in the emergency department.   Most of the history is obtained from the chart and the ED physician. ? ?Hospital Course / Discharge diagnoses: ?Principal Problem: ?  Acute respiratory failure with hypoxemia (HCC) ?Active Problems: ?  ESRD on dialysis [facility]) ?  Volume overload ?  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  Essential hypertension ?  Mild protein-calorie malnutrition ([place]) ?  CAP (community acquired pneumonia) ?  Acute on chronic combined systolic and diastolic CHF (congestive heart failure) ([place]) ?  Anemia due to chronic kidney disease ?  Acute pulmonary edema (HCC) ? ? ?Principal problem ?Acute on chronic hypoxic respiratory failure due to acute pulmonary edema in the setting of end-stage renal disease, acute on chronic combined CHF-nephrology consulted on admission.  He has oxygen at home, chronically he is on 2 L.  Initially required BiPAP.  Nephrology consulted, underwent HD x2, breathing much improved, he is feeling back to baseline and will be discharged home in stable condition.  He has HD tomorrow as per his schedule. ? ?Active problems ?Concern for community-acquired pneumonia-right middle and lower lobes showed some opacities, chest x-ray personally reviewed.  There was persistent on chest x-ray after his dialysis, continue antibiotics.  He will be transitioned to doxycycline on discharge and will complete the treatment as an outpatient.  ?ESRD on HD-nephrology consulted as above.  Repeat dialysis tomorrow as an outpatient  ?Acute on chronic combined CHF-most recent 2D echo in 2023 shows an EF  of 30-35%.  There is global hypokinesis.  Grade 1 diastolic dysfunction.  RV was normal. ?DM1 -resume home regimen on discharge ?Anemia of ESRD -hemoglobin stable, no bleeding ?Mild protein calorie malnutrition-noted ? ?Sepsis ruled out ? ? ?Discharge Instructions ? ? ?Allergies as of 10/01/2021   ? ?   Reactions  ? Sulfa Antibiotics Rash  ? Severe rash.   ? Keflex [cephalexin] Other (See Comments)  ? Patient with severe  drug reaction including exfoliating skin rash and hypotension after being prescribed both cephalexin and sulfamethoxazole-trimethoprim simultaneously. Favor SMX as much more likely culprit as patient had tolerated other cephalosporins in the past, but cannot say with complete certainty that this was not related to cephalexin.   ? Other Other (See Comments)  ? Patient states he is allergic to the TB test - unknown reaction  ? Bactrim [sulfamethoxazole-trimethoprim] Rash  ? ?  ? ?  ?Medication List  ?  ? ?TAKE these medications   ? ?Accu-Chek Guide test strip ?Generic drug: glucose blood ?Use to check fasting blood sugar once daily. 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 Insulin dependent ?  ?Accu-Chek Softclix Lancets lancets ?USE TO MEASURE BLOOD SUGAR TWICE A DAY ?  ?amLODipine 10 MG tablet ?Commonly known as: Norvasc ?Take 1 tablet by mouth once a day ?  ?atorvastatin 40 MG tablet ?Commonly known as: LIPITOR ?Take 1 tablet (40 mg total) by mouth daily. ?  ?Basaglar KwikPen 100 UNIT/ML ?Inject 10 Units into the skin daily. May substitute with any other long-acting formulary. ?  ?calcitRIOL 0.25 MCG capsule ?Commonly known as: ROCALTROL ?Take 1 capsule (0.25 mcg total) by mouth daily. ?  ?calcium gluconate 500 MG tablet ?Take 1 tablet (500 mg total) by mouth 3 (three) times daily. ?What changed: when to take this ?  ?carvedilol 12.5 MG tablet ?Commonly known as: COREG ?Take 1 tablet (12.58m total) by mouth two times a day with a meal. ?  ?doxycycline 100 MG tablet ?Commonly known as: VIBRA-TABS ?Take 1 tablet (100 mg total) by mouth every 12 (twelve) hours for 4 days. ?  ?hydrALAZINE 25 MG tablet ?Commonly known as: APRESOLINE ?Take 1 tablet by mouth three times a day ?  ?NovoLOG FlexPen 100 UNIT/ML FlexPen ?Generic drug: insulin aspart ?Inject 5 Units into the skin 2 (two) times daily with a meal. ?  ?sertraline 50 MG tablet ?Commonly known as: ZOLOFT ?Take 1 tablet (50 mg total) by mouth daily. ?  ?True Metrix Meter w/Device Kit ?Use  to measure blood sugar twice a day ?  ?Accu-Chek Guide w/Device Kit ?Use as directed ?  ?TRUEplus 5-Bevel Pen Needles 32G X 4 MM Misc ?Generic drug: Insulin Pen Needle ?Use with insulin pens ?  ? ?  ? ? Follow-up Information   ? ? KVevelyn Francois NP. Go on 10/08/2021.   ?Specialty: Adult Health Nurse Practitioner ?Why: _0 :40am ?Contact information: ?[address] ?[place]?[phone]? ? ?  ?  ? ? BLorretta Harp MD .   ?Specialties: Cardiology, Radiology ?Contact information: ?[address] ?[place]?[phone]? ? ?  ?  ? ?  ?  ? ?  ? ? ?Consultations: ?Nephrology  ? ?Procedures/Studies: ? ?DG Chest 2 View ? ?Result Date: 09/20/2021 ?CLINICAL DATA:  Shortness of breath EXAM: CHEST - 2 VIEW COMPARISON:  07/06/2021 FINDINGS: Cardiomegaly with vascular congestion and mild interstitial edema. No pleural effusion. Patchy basilar opacities could be due to atelectasis or mild pneumonia. No pneumothorax. IMPRESSION: 1. Cardiomegaly  with vascular congestion and interstitial edema 2. Patchy basilar and left mid lung opacities could reflect atelectasis or mild pneumonia. Electronically Signed   By: Donavan Foil M.D.   On: 09/20/2021 21:57  ? ?DG CHEST PORT 1 VIEW ? ?Result Date: 09/30/2021 ?CLINICAL DATA:  Respiratory failure for hypoxemia. EXAM: PORTABLE CHEST 1 VIEW COMPARISON:  09/28/2021 FINDINGS: Stable cardiac enlargement. No signs of pleural effusion. Airspace consolidation within the right mid lung is decreased from previous exam. Left lung appears clear. Interstitial edema pattern is also improved in the interval. IMPRESSION: Interval improvement in right midlung airspace opacity and mild interstitial edema. Electronically Signed   By: Kerby Moors M.D.   On: 09/30/2021 08:36  ? ?DG Chest Portable 1 View ? ?Result Date: 09/28/2021 ?CLINICAL DATA:  Shortness of breath right from dialysis. Respiratory distress EXAM: PORTABLE CHEST 1 VIEW COMPARISON:  Chest radiograph  Sep 20, 2021 FINDINGS: Cardiomegaly. Consolidative opacity in the right middle and lower lobe. Patchy bilateral interstitial opacities. No visible pleural effusion or pneumothorax. Thoracic spondylosis w

## 2021-10-01 NOTE — Progress Notes (Signed)
Pt discharged with brother on the way to make to a early morning appt. In the ER with paper work waiting on paper. ?

## 2021-10-01 NOTE — Progress Notes (Addendum)
Patient refused IV antibiotics. He said he had multiple times of loose stool during the night. He is independent in going to the bathroom. Advised patient to call the RN once he had another episode of loose stool. Education given regarding the compliance with the antibiotics. Informed V. Rathore via secured chat. No order made. Patient also refused vital signs check. Will follow up.  ? ? ?Able to get the vital signs. Refused to obtain the weight.  ?

## 2021-10-02 ENCOUNTER — Telehealth: Payer: Self-pay | Admitting: Nephrology

## 2021-10-02 ENCOUNTER — Other Ambulatory Visit: Payer: Self-pay

## 2021-10-02 ENCOUNTER — Other Ambulatory Visit (HOSPITAL_BASED_OUTPATIENT_CLINIC_OR_DEPARTMENT_OTHER): Payer: Self-pay

## 2021-10-02 MED ORDER — HYDRALAZINE HCL 25 MG PO TABS
25.0000 mg | ORAL_TABLET | Freq: Three times a day (TID) | ORAL | 3 refills | Status: DC
  Filled 2021-10-02: qty 90, 30d supply, fill #0

## 2021-10-02 MED ORDER — AMLODIPINE BESYLATE 10 MG PO TABS
10.0000 mg | ORAL_TABLET | Freq: Every day | ORAL | 3 refills | Status: DC
  Filled 2021-10-02: qty 30, 30d supply, fill #0

## 2021-10-02 MED ORDER — CARVEDILOL 12.5 MG PO TABS
ORAL_TABLET | ORAL | 0 refills | Status: DC
  Filled 2021-10-02: qty 60, 30d supply, fill #0

## 2021-10-02 NOTE — Telephone Encounter (Signed)
Transition of Care Contact from Inpatient Facility ?  ?Date of Discharge: 10/01/21 ?Date of Contact: 10/02/21 ?Method of contact: phone ?Talked to patient ?  ?Patient contacted to discuss transition of care form recent hospitaliztion. Patient was admitted to [facility] from 5/12 to 10/01/21 with the discharge diagnosis of CAP, acute respiratory failure and pulmonary edema.    ? ?Medication changes were reviewed.  ?Patient will follow up with is outpatient dialysis center 10/02/21.  ?Other follow up needs include needs BP medication refilled - sent to pharm. ?  ?Jen Mow, PA-C ?[facility] ?Pager: [phone] ?  ? ?

## 2021-10-03 LAB — CULTURE, BLOOD (ROUTINE X 2)
Culture: NO GROWTH
Culture: NO GROWTH
Special Requests: ADEQUATE

## 2021-10-04 ENCOUNTER — Encounter (HOSPITAL_COMMUNITY): Payer: Self-pay | Admitting: Emergency Medicine

## 2021-10-04 ENCOUNTER — Other Ambulatory Visit: Payer: Self-pay

## 2021-10-04 ENCOUNTER — Emergency Department (HOSPITAL_COMMUNITY): Payer: Medicaid Other

## 2021-10-04 ENCOUNTER — Emergency Department (HOSPITAL_COMMUNITY)
Admission: EM | Admit: 2021-10-04 | Discharge: 2021-10-04 | Disposition: A | Discharge status: Home / Self Care | Payer: Medicaid Other | Attending: Emergency Medicine | Admitting: Emergency Medicine

## 2021-10-04 DIAGNOSIS — R7989 Other specified abnormal findings of blood chemistry: Secondary | ICD-10-CM | POA: Insufficient documentation

## 2021-10-04 DIAGNOSIS — I10 Essential (primary) hypertension: Secondary | ICD-10-CM | POA: Diagnosis not present

## 2021-10-04 DIAGNOSIS — N644 Mastodynia: Secondary | ICD-10-CM | POA: Diagnosis present

## 2021-10-04 DIAGNOSIS — E119 Type 2 diabetes mellitus without complications: Secondary | ICD-10-CM | POA: Diagnosis not present

## 2021-10-04 DIAGNOSIS — R944 Abnormal results of kidney function studies: Secondary | ICD-10-CM | POA: Diagnosis not present

## 2021-10-04 DIAGNOSIS — N62 Hypertrophy of breast: Secondary | ICD-10-CM | POA: Diagnosis not present

## 2021-10-04 LAB — CBC WITH DIFFERENTIAL/PLATELET
Abs Immature Granulocytes: 0.06 10*3/uL (ref 0.00–0.07)
Basophils Absolute: 0 10*3/uL (ref 0.0–0.1)
Basophils Relative: 0 %
Eosinophils Absolute: 0.3 10*3/uL (ref 0.0–0.5)
Eosinophils Relative: 3 %
HCT: 37.6 % — ABNORMAL LOW (ref 39.0–52.0)
Hemoglobin: 11.6 g/dL — ABNORMAL LOW (ref 13.0–17.0)
Immature Granulocytes: 1 %
Lymphocytes Relative: 18 %
Lymphs Abs: 1.6 10*3/uL (ref 0.7–4.0)
MCH: 28 pg (ref 26.0–34.0)
MCHC: 30.9 g/dL (ref 30.0–36.0)
MCV: 90.8 fL (ref 80.0–100.0)
Monocytes Absolute: 1.1 10*3/uL — ABNORMAL HIGH (ref 0.1–1.0)
Monocytes Relative: 13 %
Neutro Abs: 5.9 10*3/uL (ref 1.7–7.7)
Neutrophils Relative %: 65 %
Platelets: 440 10*3/uL — ABNORMAL HIGH (ref 150–400)
RBC: 4.14 MIL/uL — ABNORMAL LOW (ref 4.22–5.81)
RDW: 16.1 % — ABNORMAL HIGH (ref 11.5–15.5)
WBC: 9 10*3/uL (ref 4.0–10.5)
nRBC: 0.2 % (ref 0.0–0.2)

## 2021-10-04 LAB — COMPREHENSIVE METABOLIC PANEL
ALT: 35 U/L (ref 0–44)
AST: 38 U/L (ref 15–41)
Albumin: 2.3 g/dL — ABNORMAL LOW (ref 3.5–5.0)
Alkaline Phosphatase: 296 U/L — ABNORMAL HIGH (ref 38–126)
Anion gap: 12 (ref 5–15)
BUN: 45 mg/dL — ABNORMAL HIGH (ref 6–20)
CO2: 21 mmol/L — ABNORMAL LOW (ref 22–32)
Calcium: 7.1 mg/dL — ABNORMAL LOW (ref 8.9–10.3)
Chloride: 105 mmol/L (ref 98–111)
Creatinine, Ser: 9.1 mg/dL — ABNORMAL HIGH (ref 0.61–1.24)
GFR, Estimated: 7 mL/min — ABNORMAL LOW (ref >60)
Glucose, Bld: 138 mg/dL — ABNORMAL HIGH (ref 70–99)
Potassium: 4.5 mmol/L (ref 3.5–5.1)
Sodium: 138 mmol/L (ref 135–145)
Total Bilirubin: 0.6 mg/dL (ref 0.3–1.2)
Total Protein: 7 g/dL (ref 6.5–8.1)

## 2021-10-04 LAB — TROPONIN I (HIGH SENSITIVITY)
Troponin I (High Sensitivity): 309 ng/L (ref <18)
Troponin I (High Sensitivity): 315 ng/L (ref <18)

## 2021-10-04 LAB — HCG, SERUM, QUALITATIVE: Preg, Serum: NEGATIVE

## 2021-10-04 NOTE — ED Provider Triage Note (Signed)
Emergency Medicine Provider Triage Evaluation Note  [name] , a 51 y.o. male  was evaluated in triage.  Pt complains of gradual onset, constant, bilateral chest/breast pain x 1 week. Per chart review pt recently admitted from 05/12-05/15 for acute on chronic hypoxic respiratory failure s/2 acute pulmonary edema due to missed dialysis. He requires BiPAP however was weaned off during hospitalization. CXR was concerning for CAP. Pt discharged with doxycycline however states he has not filled the medication yet. He states he was having this same pain when he was admitted. He does states his breasts feel "lumpy."   Review of Systems  Positive: + chest/breast pain Negative: - SOB  Physical Exam  BP (!) 190/106 (BP Location: Left Arm)   Pulse 100   Temp 98.5 F (36.9 C) (Oral)   Resp 18   Ht 5\' 7"  (1.702 m)   Wt 82 kg   SpO2 100%   BMI 28.31 kg/m  Gen:   Awake, no distress   Resp:  Normal effort  MSK:   Moves extremities without difficulty  Other:    Medical Decision Making  Medically screening exam initiated at 12:39 AM.  Appropriate orders placed.  [name] was informed that the remainder of the evaluation will be completed by another provider, this initial triage assessment does not replace that evaluation, and the importance of remaining in the ED until their evaluation is complete.     Eustaquio Maize, PA-C 10/04/21 0041

## 2021-10-04 NOTE — Discharge Instructions (Signed)
You were evaluated in the Emergency Department and after careful evaluation, we did not find any emergent condition requiring admission or further testing in the hospital.  Your exam/testing today is overall reassuring.  Recommend follow-up with your regular doctor to discuss your symptoms.  We did some hormone testing here in the emergency department and the need to be followed up with by your doctor.  Please return to the Emergency Department if you experience any worsening of your condition.   Thank you for allowing [place] to be a part of your care.

## 2021-10-04 NOTE — ED Notes (Signed)
This RN attempted to dress pt in gown, hook pt to monitor, and draw labs. Pt refusing all care and stating he wants to leave. Provider notified.

## 2021-10-04 NOTE — ED Triage Notes (Signed)
Patient reports pain at bilateral breasts with knots onset last week , respirations unlabored , denies fever or chills.

## 2021-10-04 NOTE — ED Provider Notes (Signed)
[facility] Emergency Department Provider Note MRN:  [id number]  Arrival date & time: 10/04/21     Chief Complaint   Breast Pain "knots"   History of Present Illness   [name] is a 51 y.o. year-old male with a history of ESRD, diabetes presenting to the ED with chief complaint of nipple pain.  Pain to bilateral nipples with underlying firm nodules bilaterally.  Otherwise no significant symptoms, no chest pain or shortness of breath, no abdominal pain, last dialysis was Tuesday.  Review of Systems  A thorough review of systems was obtained and all systems are negative except as noted in the HPI and PMH.   Patient's Health History    Past Medical History:  Diagnosis Date   Anxiety 02/2019   Anxiety 10/2019   Diabetes mellitus type II, uncontrolled 07/17/2006   Elevated alkaline phosphatase level 01/2019   ESSENTIAL HYPERTENSION 07/17/2006   HYPERLIPIDEMIA 11/24/2008   Insomnia 02/2019   Left bundle branch block 02/22/2019   Lesion of lip 10/2019   Pneumonia 10/22/2011   Suicidal ideations 02/2019   Vitamin D deficiency 01/2019    Past Surgical History:  Procedure Laterality Date   ARM SURGERY     AV FISTULA PLACEMENT Right 02/20/2021   Procedure: RIGHT ARTERIOVENOUS GRAFT CREATION;  Surgeon: Angelia Mould, MD;  Location: [facility] OR;  Service: Vascular;  Laterality: Right;   INSERTION OF DIALYSIS CATHETER Left 02/20/2021   Procedure: INSERTION OF DIALYSIS CATHETER USING PALINDROME CATHETER;  Surgeon: Angelia Mould, MD;  Location: [facility] OR;  Service: Vascular;  Laterality: Left;   IR REMOVAL TUN CV CATH W/O FL  07/27/2021   ULTRASOUND GUIDANCE FOR VASCULAR ACCESS  02/20/2021   Procedure: ULTRASOUND GUIDANCE FOR VASCULAR ACCESS;  Surgeon: Angelia Mould, MD;  Location: [facility] OR;  Service: Vascular;;    Family History  Problem Relation Age of Onset   Hypertension Mother 44   Cancer Mother        bladder cancer   Alcohol abuse Father     Hypertension Brother     Social History   Socioeconomic History   Marital status: Legally Separated    Spouse name: Not on file   Number of children: Not on file   Years of education: Not on file   Highest education level: Not on file  Occupational History   Occupation: cook    Comment: lost job   Occupation: Biochemist, clinical    Comment: Updated June 2013   Occupation: [employer]: Began Jan 2014  Tobacco Use   Smoking status: Never   Smokeless tobacco: Never  Vaping Use   Vaping Use: Never used  Substance and Sexual Activity   Alcohol use: No    Alcohol/week: 0.0 standard drinks   Drug use: No   Sexual activity: Yes    Partners: Female  Other Topics Concern   Not on file  Social History Narrative   Lives with his mother and brother.   Wife has had a foot amputation; is a type 1 DM.   Social Determinants of Health   Financial Resource Strain: Not on file  Food Insecurity: Not on file  Transportation Needs: Not on file  Physical Activity: Not on file  Stress: Not on file  Social Connections: Not on file  Intimate Partner Violence: Not on file     Physical Exam   Vitals:   10/04/21 0300 10/04/21 0315  BP: (!) 172/100 (!) 177/104  Pulse:  95 96  Resp: 14 19  Temp:    SpO2: 94% 90%    CONSTITUTIONAL: Well-appearing, NAD NEURO/PSYCH:  Alert and oriented x 3, no focal deficits EYES:  eyes equal and reactive ENT/NECK:  no LAD, no JVD CARDIO: Regular rate, well-perfused, normal S1 and S2 PULM:  CTAB no wheezing or rhonchi GI/GU:  non-distended, non-tender MSK/SPINE:  No gross deformities, no edema SKIN: 2 or 3 cm firm circular nodules beneath bilateral nipples   *Additional and/or pertinent findings included in MDM below  Diagnostic and Interventional Summary    EKG Interpretation  Date/Time:  Thursday Oct 04 2021 00:36:54 EDT Ventricular Rate:  96 PR Interval:  140 QRS Duration: 142 QT Interval:  420 QTC Calculation: 530 R  Axis:   265 Text Interpretation: Normal sinus rhythm Possible Left atrial enlargement Right superior axis deviation Left bundle branch block Abnormal ECG When compared with ECG of 28-Sep-2021 09:03, PREVIOUS ECG IS PRESENT Confirmed by Gerlene Fee [phone]) on 10/04/2021 2:15:14 AM       Labs Reviewed  CBC WITH DIFFERENTIAL/PLATELET - Abnormal; Notable for the following components:      Result Value   RBC 4.14 (*)    Hemoglobin 11.6 (*)    HCT 37.6 (*)    RDW 16.1 (*)    Platelets 440 (*)    Monocytes Absolute 1.1 (*)    All other components within normal limits  COMPREHENSIVE METABOLIC PANEL - Abnormal; Notable for the following components:   CO2 21 (*)    Glucose, Bld 138 (*)    BUN 45 (*)    Creatinine, Ser 9.10 (*)    Calcium 7.1 (*)    Albumin 2.3 (*)    Alkaline Phosphatase 296 (*)    GFR, Estimated 7 (*)    All other components within normal limits  TROPONIN I (HIGH SENSITIVITY) - Abnormal; Notable for the following components:   Troponin I (High Sensitivity) 309 (*)    All other components within normal limits  TROPONIN I (HIGH SENSITIVITY) - Abnormal; Notable for the following components:   Troponin I (High Sensitivity) 315 (*)    All other components within normal limits  HCG, SERUM, QUALITATIVE  LUTEINIZING HORMONE  TESTOSTERONE,FREE AND TOTAL  ESTRADIOL    DG Chest 2 View  Final Result      Medications - No data to display   Procedures  /  Critical Care Procedures  ED Course and Medical Decision Making  Initial Impression and Ddx Differential diagnosis includes breast cancer, gynecomastia.  Labs in triage reveal a troponin of 300, patient is ESRD and has history of elevated troponins in the 200s.  Will repeat, obtaining CT chest.  Past medical/surgical history that increases complexity of ED encounter: ESRD  Interpretation of Diagnostics I personally reviewed the EKG and my interpretation is as follows: Sinus rhythm, no concerning ischemic changes     Labs otherwise reveal no significant blood count or electrolyte disturbance, elevated BUN and creatinine as expected in the setting of ESRD.  Hormonal testing with regard to gynecomastia initiated, still pending.  Patient Reassessment and Ultimate Disposition/Management Patient continues to feel well, again denies having any chest pain or shortness of breath, only concerned about the masses or nodules beneath his nipples.  He refused CT imaging of the chest.  Overall the concern for breast cancer is low and given the bilateral nature and location of the breast masses gynecomastia is favored.  Second troponin is flat upon repeat.  Admission offered but patient  wants to go home, appropriate for discharge.  Patient management required discussion with the following services or consulting groups:  None  Complexity of Problems Addressed Acute illness or injury that poses threat of life of bodily function  Additional Data Reviewed and Analyzed Further history obtained from: None  Additional Factors Impacting ED Encounter Risk Consideration of hospitalization  Barth Kirks. Sedonia Small, [place] [email or web address]  Final Clinical Impressions(s) / ED Diagnoses     ICD-10-CM   1. Gynecomastia  N62       ED Discharge Orders     None        Discharge Instructions Discussed with and Provided to Patient:     Discharge Instructions      You were evaluated in the Emergency Department and after careful evaluation, we did not find any emergent condition requiring admission or further testing in the hospital.  Your exam/testing today is overall reassuring.  Recommend follow-up with your regular doctor to discuss your symptoms.  We did some hormone testing here in the emergency department and the need to be followed up with by your doctor.  Please return to the Emergency Department if you experience any worsening of your condition.   Thank you  for allowing [place] to be a part of your care.       Maudie Flakes, MD 10/04/21 [phone]

## 2021-10-05 ENCOUNTER — Ambulatory Visit (HOSPITAL_COMMUNITY)
Admission: EM | Admit: 2021-10-05 | Discharge: 2021-10-05 | Disposition: A | Discharge status: Home / Self Care | Payer: Medicaid Other

## 2021-10-05 DIAGNOSIS — R0789 Other chest pain: Secondary | ICD-10-CM

## 2021-10-05 LAB — ESTRADIOL: Estradiol: 77.5 pg/mL — ABNORMAL HIGH (ref 7.6–42.6)

## 2021-10-05 LAB — TESTOSTERONE,FREE AND TOTAL
Testosterone, Free: 17.9 pg/mL (ref 6.8–21.5)
Testosterone: 276 ng/dL (ref 264–916)

## 2021-10-05 LAB — LUTEINIZING HORMONE: LH: 11.8 m[IU]/mL — ABNORMAL HIGH (ref 1.7–8.6)

## 2021-10-05 NOTE — ED Notes (Signed)
Provider recommended pt go to ED for further evaluation. Pt was seen in the ED and had concerning labs yesterday.

## 2021-10-05 NOTE — ED Provider Notes (Signed)
Please head back to the Emergency Department. You need to complete the workup offered 1 day ago in the ED, including CT imaging and labwork that we cannot perform here.   Pt is in agreement, states he will head there, declines transport via EMS   Hazel Sams, PA-C 10/05/21 1441

## 2021-10-05 NOTE — Discharge Instructions (Addendum)
Please head back to the Emergency Department. They were concerned about your labwork and chest pain, considering you were admitted last week for the same thing. They wanted to admit you yesterday. Please head straight there, stop and call 911 if symptoms worsen on the way.

## 2021-10-08 ENCOUNTER — Ambulatory Visit: Payer: Medicaid Other | Admitting: Nurse Practitioner

## 2021-10-08 ENCOUNTER — Other Ambulatory Visit: Payer: Self-pay

## 2021-10-09 ENCOUNTER — Emergency Department (HOSPITAL_COMMUNITY): Payer: Medicaid Other

## 2021-10-09 ENCOUNTER — Other Ambulatory Visit: Payer: Self-pay

## 2021-10-09 ENCOUNTER — Encounter (HOSPITAL_COMMUNITY): Payer: Self-pay | Admitting: Emergency Medicine

## 2021-10-09 ENCOUNTER — Emergency Department (HOSPITAL_COMMUNITY)
Admission: EM | Admit: 2021-10-09 | Discharge: 2021-10-10 | Disposition: A | Discharge status: Home / Self Care | Payer: Medicaid Other | Source: Home / Self Care | Attending: Student | Admitting: Student

## 2021-10-09 ENCOUNTER — Emergency Department (HOSPITAL_COMMUNITY)
Admission: EM | Admit: 2021-10-09 | Discharge: 2021-10-09 | Discharge status: Against Medical Advice | Payer: Medicaid Other | Attending: Student | Admitting: Student

## 2021-10-09 DIAGNOSIS — R2243 Localized swelling, mass and lump, lower limb, bilateral: Secondary | ICD-10-CM | POA: Insufficient documentation

## 2021-10-09 DIAGNOSIS — E1122 Type 2 diabetes mellitus with diabetic chronic kidney disease: Secondary | ICD-10-CM | POA: Insufficient documentation

## 2021-10-09 DIAGNOSIS — Z91158 Patient's noncompliance with renal dialysis for other reason: Secondary | ICD-10-CM | POA: Insufficient documentation

## 2021-10-09 DIAGNOSIS — J181 Lobar pneumonia, unspecified organism: Secondary | ICD-10-CM | POA: Insufficient documentation

## 2021-10-09 DIAGNOSIS — N644 Mastodynia: Secondary | ICD-10-CM | POA: Insufficient documentation

## 2021-10-09 DIAGNOSIS — M7989 Other specified soft tissue disorders: Secondary | ICD-10-CM | POA: Insufficient documentation

## 2021-10-09 DIAGNOSIS — Z794 Long term (current) use of insulin: Secondary | ICD-10-CM | POA: Insufficient documentation

## 2021-10-09 DIAGNOSIS — I120 Hypertensive chronic kidney disease with stage 5 chronic kidney disease or end stage renal disease: Secondary | ICD-10-CM | POA: Insufficient documentation

## 2021-10-09 DIAGNOSIS — R0600 Dyspnea, unspecified: Secondary | ICD-10-CM | POA: Diagnosis not present

## 2021-10-09 DIAGNOSIS — E1165 Type 2 diabetes mellitus with hyperglycemia: Secondary | ICD-10-CM | POA: Insufficient documentation

## 2021-10-09 DIAGNOSIS — R7989 Other specified abnormal findings of blood chemistry: Secondary | ICD-10-CM | POA: Insufficient documentation

## 2021-10-09 DIAGNOSIS — R778 Other specified abnormalities of plasma proteins: Secondary | ICD-10-CM | POA: Insufficient documentation

## 2021-10-09 DIAGNOSIS — N186 End stage renal disease: Secondary | ICD-10-CM | POA: Insufficient documentation

## 2021-10-09 DIAGNOSIS — R079 Chest pain, unspecified: Secondary | ICD-10-CM | POA: Insufficient documentation

## 2021-10-09 DIAGNOSIS — D649 Anemia, unspecified: Secondary | ICD-10-CM | POA: Insufficient documentation

## 2021-10-09 DIAGNOSIS — Z5321 Procedure and treatment not carried out due to patient leaving prior to being seen by health care provider: Secondary | ICD-10-CM | POA: Diagnosis not present

## 2021-10-09 DIAGNOSIS — Z79899 Other long term (current) drug therapy: Secondary | ICD-10-CM | POA: Insufficient documentation

## 2021-10-09 DIAGNOSIS — J189 Pneumonia, unspecified organism: Secondary | ICD-10-CM

## 2021-10-09 LAB — COMPREHENSIVE METABOLIC PANEL
ALT: 76 U/L — ABNORMAL HIGH (ref 0–44)
AST: 83 U/L — ABNORMAL HIGH (ref 15–41)
Albumin: 2.4 g/dL — ABNORMAL LOW (ref 3.5–5.0)
Alkaline Phosphatase: 355 U/L — ABNORMAL HIGH (ref 38–126)
Anion gap: 12 (ref 5–15)
BUN: 59 mg/dL — ABNORMAL HIGH (ref 6–20)
CO2: 22 mmol/L (ref 22–32)
Calcium: 6.4 mg/dL — CL (ref 8.9–10.3)
Chloride: 104 mmol/L (ref 98–111)
Creatinine, Ser: 9.18 mg/dL — ABNORMAL HIGH (ref 0.61–1.24)
GFR, Estimated: 6 mL/min — ABNORMAL LOW (ref >60)
Glucose, Bld: 160 mg/dL — ABNORMAL HIGH (ref 70–99)
Potassium: 4.5 mmol/L (ref 3.5–5.1)
Sodium: 138 mmol/L (ref 135–145)
Total Bilirubin: 0.8 mg/dL (ref 0.3–1.2)
Total Protein: 7 g/dL (ref 6.5–8.1)

## 2021-10-09 LAB — CBC WITH DIFFERENTIAL/PLATELET
Abs Immature Granulocytes: 0.04 10*3/uL (ref 0.00–0.07)
Abs Immature Granulocytes: 0.03 10*3/uL (ref 0.00–0.07)
Basophils Absolute: 0 10*3/uL (ref 0.0–0.1)
Basophils Absolute: 0 10*3/uL (ref 0.0–0.1)
Basophils Relative: 0 %
Basophils Relative: 1 %
Eosinophils Absolute: 0.3 10*3/uL (ref 0.0–0.5)
Eosinophils Absolute: 0.2 10*3/uL (ref 0.0–0.5)
Eosinophils Relative: 4 %
Eosinophils Relative: 4 %
HCT: 31.2 % — ABNORMAL LOW (ref 39.0–52.0)
HCT: 30.9 % — ABNORMAL LOW (ref 39.0–52.0)
Hemoglobin: 9.5 g/dL — ABNORMAL LOW (ref 13.0–17.0)
Hemoglobin: 9.6 g/dL — ABNORMAL LOW (ref 13.0–17.0)
Immature Granulocytes: 1 %
Immature Granulocytes: 1 %
Lymphocytes Relative: 15 %
Lymphocytes Relative: 17 %
Lymphs Abs: 1.1 10*3/uL (ref 0.7–4.0)
Lymphs Abs: 1.1 10*3/uL (ref 0.7–4.0)
MCH: 27.5 pg (ref 26.0–34.0)
MCH: 28.3 pg (ref 26.0–34.0)
MCHC: 30.4 g/dL (ref 30.0–36.0)
MCHC: 31.1 g/dL (ref 30.0–36.0)
MCV: 90.4 fL (ref 80.0–100.0)
MCV: 91.2 fL (ref 80.0–100.0)
Monocytes Absolute: 1 10*3/uL (ref 0.1–1.0)
Monocytes Absolute: 0.8 10*3/uL (ref 0.1–1.0)
Monocytes Relative: 13 %
Monocytes Relative: 13 %
Neutro Abs: 5.1 10*3/uL (ref 1.7–7.7)
Neutro Abs: 4.3 10*3/uL (ref 1.7–7.7)
Neutrophils Relative %: 67 %
Neutrophils Relative %: 64 %
Platelets: 360 10*3/uL (ref 150–400)
Platelets: 348 10*3/uL (ref 150–400)
RBC: 3.45 MIL/uL — ABNORMAL LOW (ref 4.22–5.81)
RBC: 3.39 MIL/uL — ABNORMAL LOW (ref 4.22–5.81)
RDW: 16.6 % — ABNORMAL HIGH (ref 11.5–15.5)
RDW: 16.9 % — ABNORMAL HIGH (ref 11.5–15.5)
WBC: 7.5 10*3/uL (ref 4.0–10.5)
WBC: 6.5 10*3/uL (ref 4.0–10.5)
nRBC: 0 % (ref 0.0–0.2)
nRBC: 0 % (ref 0.0–0.2)

## 2021-10-09 LAB — BASIC METABOLIC PANEL
Anion gap: 14 (ref 5–15)
BUN: 64 mg/dL — ABNORMAL HIGH (ref 6–20)
CO2: 20 mmol/L — ABNORMAL LOW (ref 22–32)
Calcium: 6.5 mg/dL — ABNORMAL LOW (ref 8.9–10.3)
Chloride: 103 mmol/L (ref 98–111)
Creatinine, Ser: 9.46 mg/dL — ABNORMAL HIGH (ref 0.61–1.24)
GFR, Estimated: 6 mL/min — ABNORMAL LOW (ref >60)
Glucose, Bld: 162 mg/dL — ABNORMAL HIGH (ref 70–99)
Potassium: 4.3 mmol/L (ref 3.5–5.1)
Sodium: 137 mmol/L (ref 135–145)

## 2021-10-09 LAB — MAGNESIUM: Magnesium: 2.3 mg/dL (ref 1.7–2.4)

## 2021-10-09 LAB — TROPONIN I (HIGH SENSITIVITY)
Troponin I (High Sensitivity): 193 ng/L (ref <18)
Troponin I (High Sensitivity): 162 ng/L (ref <18)

## 2021-10-09 MED ORDER — IOHEXOL 300 MG/ML  SOLN
75.0000 mL | Freq: Once | INTRAMUSCULAR | Status: AC | PRN
  Administered 2021-10-09: 75 mL via INTRAVENOUS

## 2021-10-09 NOTE — ED Triage Notes (Signed)
[place] reports bilateral lower legs swelling and knots at both breast .

## 2021-10-09 NOTE — ED Notes (Signed)
Patient refusing to go to room. States "I would rather stay in the lobby...give the room to someone else, I'm not going back there". Multiple members of staff attempted to persuade pt to get treatment in the back but pt continued to refuse. Security escorted the pt outside per charge orders of either staying for treatment or exiting the premises.
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
## 2021-10-09 NOTE — ED Notes (Signed)
Patient transported to CT 

## 2021-10-09 NOTE — ED Notes (Signed)
Patient reluctant to go to treatment room. Explained that if he didn't want to be seen now then he would have to leave.

## 2021-10-09 NOTE — ED Provider Triage Note (Signed)
Emergency Medicine Provider Triage Evaluation Note  ZAIDYN CLAIRE , a 51 y.o. male  was evaluated in triage.  Pt complains of bilateral lower extremity swelling, dyspnea, and chest pain/swelling.  Lower extremity swelling bilaterally for past 2-3 days, progressively worsening, associated dyspnea with activity.   Bilateral chest/breast pain/swelling to the areola region x 1-1.5 weeks.   Last dialysis Saturday, typically T/Th/Sat  Review of Systems  Positive: Lower extremity swelling, dyspnea, breast/chest pain/swelling Negative: Vomiting, cough, fever  Physical Exam  BP (!) 165/104 (BP Location: Left Arm)   Pulse 91   Temp 98.9 F (37.2 C)   Resp 18   SpO2 96%  Gen:   Awake, no distress   Resp:  Normal effort  MSK:   Moves extremities without difficulty, pitting edema to bilateral lower legs.   Medical Decision Making  Medically screening exam initiated at 1:46 AM.  Appropriate orders placed.  KHALED HERDA was informed that the remainder of the evaluation will be completed by another provider, this initial triage assessment does not replace that evaluation, and the importance of remaining in the ED until their evaluation is complete.  Leg swelling.    Amaryllis Dyke, [place] 10/09/21 0148

## 2021-10-09 NOTE — ED Provider Triage Note (Signed)
Emergency Medicine Provider Triage Evaluation Note  [name] , a 51 y.o. male  was evaluated in triage.  Pt complains of shortness of breath and pain in bilateral chest.  Symptoms have been gradually worsening over the past few days.  Did not go to dialysis today.  Did go to dialysis last Saturday.  Worsening lower extremity edema.  Review of Systems  Positive: Shortness of breath, chest pain, bilateral lower extremity edema Negative: Fever  Physical Exam  BP (!) 176/102 (BP Location: Left Arm)   Pulse 88   Temp 98.2 F (36.8 C)   Resp 16   SpO2 93%  Gen:   Awake, no distress   Resp:  Normal effort  MSK:   Moves extremities without difficulty  Other:  edema to bilateral lower extremities  Medical Decision Making  Medically screening exam initiated at 5:49 PM.  Appropriate orders placed.  FAISAL STRADLING was informed that the remainder of the evaluation will be completed by another provider, this initial triage assessment does not replace that evaluation, and the importance of remaining in the ED until their evaluation is complete.  Labs and EKG ordered   Delia Heady, PA-C 10/09/21 1750

## 2021-10-10 ENCOUNTER — Other Ambulatory Visit: Payer: Self-pay

## 2021-10-10 LAB — TROPONIN I (HIGH SENSITIVITY): Troponin I (High Sensitivity): 166 ng/L (ref <18)

## 2021-10-10 LAB — BRAIN NATRIURETIC PEPTIDE: B Natriuretic Peptide: 3018.4 pg/mL — ABNORMAL HIGH (ref 0.0–100.0)

## 2021-10-10 MED ORDER — DOXYCYCLINE HYCLATE 100 MG PO TABS
100.0000 mg | ORAL_TABLET | Freq: Two times a day (BID) | ORAL | 0 refills | Status: DC
  Filled 2021-10-10: qty 20, 10d supply, fill #0

## 2021-10-10 NOTE — ED Notes (Signed)
Pt able to ambulate to bathroom independently.

## 2021-10-10 NOTE — Discharge Instructions (Addendum)
Lab work and imaging were reassuring please follow-up with dialysis center tomorrow for a make-up appointment. Breast pain-please follow-up with the breast imaging center for further evaluation Pneumonia-start you on antibiotics please take as prescribed  Come back to the emergency department if you develop chest pain, shortness of breath, severe abdominal pain, uncontrolled nausea, vomiting, diarrhea.
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
## 2021-10-12 ENCOUNTER — Emergency Department (HOSPITAL_COMMUNITY): Payer: Medicaid Other

## 2021-10-12 ENCOUNTER — Emergency Department (HOSPITAL_COMMUNITY)
Admission: EM | Admit: 2021-10-12 | Discharge: 2021-10-12 | Disposition: A | Discharge status: Home / Self Care | Payer: Medicaid Other | Attending: Emergency Medicine | Admitting: Emergency Medicine

## 2021-10-12 ENCOUNTER — Encounter (HOSPITAL_COMMUNITY): Payer: Self-pay

## 2021-10-12 ENCOUNTER — Other Ambulatory Visit: Payer: Self-pay

## 2021-10-12 DIAGNOSIS — E1122 Type 2 diabetes mellitus with diabetic chronic kidney disease: Secondary | ICD-10-CM | POA: Insufficient documentation

## 2021-10-12 DIAGNOSIS — Z79899 Other long term (current) drug therapy: Secondary | ICD-10-CM | POA: Insufficient documentation

## 2021-10-12 DIAGNOSIS — Z794 Long term (current) use of insulin: Secondary | ICD-10-CM | POA: Diagnosis not present

## 2021-10-12 DIAGNOSIS — M79604 Pain in right leg: Secondary | ICD-10-CM

## 2021-10-12 DIAGNOSIS — N186 End stage renal disease: Secondary | ICD-10-CM | POA: Insufficient documentation

## 2021-10-12 DIAGNOSIS — R600 Localized edema: Secondary | ICD-10-CM | POA: Diagnosis not present

## 2021-10-12 DIAGNOSIS — I120 Hypertensive chronic kidney disease with stage 5 chronic kidney disease or end stage renal disease: Secondary | ICD-10-CM | POA: Diagnosis not present

## 2021-10-12 DIAGNOSIS — Z992 Dependence on renal dialysis: Secondary | ICD-10-CM | POA: Diagnosis not present

## 2021-10-12 DIAGNOSIS — R7401 Elevation of levels of liver transaminase levels: Secondary | ICD-10-CM | POA: Insufficient documentation

## 2021-10-12 LAB — COMPREHENSIVE METABOLIC PANEL
ALT: 67 U/L — ABNORMAL HIGH (ref 0–44)
AST: 43 U/L — ABNORMAL HIGH (ref 15–41)
Albumin: 2.4 g/dL — ABNORMAL LOW (ref 3.5–5.0)
Alkaline Phosphatase: 336 U/L — ABNORMAL HIGH (ref 38–126)
Anion gap: 12 (ref 5–15)
BUN: 33 mg/dL — ABNORMAL HIGH (ref 6–20)
CO2: 24 mmol/L (ref 22–32)
Calcium: 7.9 mg/dL — ABNORMAL LOW (ref 8.9–10.3)
Chloride: 100 mmol/L (ref 98–111)
Creatinine, Ser: 6.78 mg/dL — ABNORMAL HIGH (ref 0.61–1.24)
GFR, Estimated: 9 mL/min — ABNORMAL LOW (ref >60)
Glucose, Bld: 122 mg/dL — ABNORMAL HIGH (ref 70–99)
Potassium: 3.5 mmol/L (ref 3.5–5.1)
Sodium: 136 mmol/L (ref 135–145)
Total Bilirubin: 0.4 mg/dL (ref 0.3–1.2)
Total Protein: 6.8 g/dL (ref 6.5–8.1)

## 2021-10-12 LAB — CBC WITH DIFFERENTIAL/PLATELET
Abs Immature Granulocytes: 0.02 10*3/uL (ref 0.00–0.07)
Basophils Absolute: 0 10*3/uL (ref 0.0–0.1)
Basophils Relative: 1 %
Eosinophils Absolute: 0.2 10*3/uL (ref 0.0–0.5)
Eosinophils Relative: 3 %
HCT: 32.4 % — ABNORMAL LOW (ref 39.0–52.0)
Hemoglobin: 9.8 g/dL — ABNORMAL LOW (ref 13.0–17.0)
Immature Granulocytes: 0 %
Lymphocytes Relative: 14 %
Lymphs Abs: 0.9 10*3/uL (ref 0.7–4.0)
MCH: 27.6 pg (ref 26.0–34.0)
MCHC: 30.2 g/dL (ref 30.0–36.0)
MCV: 91.3 fL (ref 80.0–100.0)
Monocytes Absolute: 0.9 10*3/uL (ref 0.1–1.0)
Monocytes Relative: 15 %
Neutro Abs: 4.3 10*3/uL (ref 1.7–7.7)
Neutrophils Relative %: 67 %
Platelets: 386 10*3/uL (ref 150–400)
RBC: 3.55 MIL/uL — ABNORMAL LOW (ref 4.22–5.81)
RDW: 16.9 % — ABNORMAL HIGH (ref 11.5–15.5)
WBC: 6.4 10*3/uL (ref 4.0–10.5)
nRBC: 0 % (ref 0.0–0.2)

## 2021-10-12 LAB — MAGNESIUM: Magnesium: 2 mg/dL (ref 1.7–2.4)

## 2021-10-12 LAB — BRAIN NATRIURETIC PEPTIDE: B Natriuretic Peptide: >4500 pg/mL — ABNORMAL HIGH (ref 0.0–100.0)

## 2021-10-12 NOTE — ED Notes (Signed)
ED provider informed that pt has refused IV and portable x-ray

## 2021-10-12 NOTE — ED Provider Notes (Signed)
[facility] EMERGENCY DEPARTMENT Provider Note   CSN: [id number] Arrival date & time: 10/12/21  0229     History  Chief Complaint  Patient presents with   Leg Pain    [name] is a 51 y.o. male with end-stage renal disease on hemodialysis Tuesday/Thursday/Saturday who presents with concern for bilateral leg swelling.  Presents via EMS after being found sleeping in an elevator downtown. States he is at his baseline with breathing, leg swelling. Mostly wants a place to sleep.   He denies any new symptoms, last had dialysis on Thursday 10/11/21. I have personally reviewed his medical records, history of hyperlipidemia, HTN, CAP on doxycycline, type 2 DM.   HPI     Home Medications Prior to Admission medications   Medication Sig Start Date End Date Taking? Authorizing Provider  amLODipine (NORVASC) 10 MG tablet Take 1 tablet by mouth once a day 10/02/21     atorvastatin (LIPITOR) 40 MG tablet Take 1 tablet (40 mg total) by mouth daily. Patient not taking: Reported on 3/76/2831 10/04/59   Delora Fuel, MD  Blood Glucose Monitoring Suppl (ACCU-CHEK GUIDE) w/Device KIT Use as directed 11/29/20   [name], MD  Blood Glucose Monitoring Suppl (TRUE METRIX METER) w/Device KIT Use to measure blood sugar twice a day 02/22/20   [name], MD  calcitRIOL (ROCALTROL) 0.25 MCG capsule Take 1 capsule (0.25 mcg total) by mouth daily. Patient not taking: Reported on 10/24/3708 11/12/92   Delora Fuel, MD  calcium gluconate 500 MG tablet Take 1 tablet (500 mg total) by mouth 3 (three) times daily. Patient taking differently: Take 1 tablet by mouth daily. 8/54/62   Delora Fuel, MD  carvedilol (COREG) 12.5 MG tablet Take 1 tablet by mouth twice a day with a meal 10/02/21     doxycycline (VIBRA-TABS) 100 MG tablet Take 1 tablet (100 mg total) by mouth 2 (two) times daily. 10/10/21   Marcello Fennel, PA-C  glucose blood test strip Use to check fasting blood sugar  once daily. 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 Insulin dependent 11/29/20   [name], MD  hydrALAZINE (APRESOLINE) 25 MG tablet Take 1 tablet by mouth three times a day 10/02/21     insulin aspart (NOVOLOG FLEXPEN) 100 UNIT/ML FlexPen Inject 5 Units into the skin 2 (two) times daily with a meal. Patient not taking: Reported on 09/28/2021 02/26/21   [name], [name], MD  insulin glargine (LANTUS) 100 UNIT/ML Solostar Pen Inject 10 Units into the skin daily. May substitute with any other long-acting formulary. Patient not taking: Reported on 09/28/2021 05/13/21   [name], MD  Insulin Pen Needle 32G X 4 MM MISC Use with insulin pens 02/26/21   [name], [name], MD  sertraline (ZOLOFT) 50 MG tablet Take 1 tablet (50 mg total) by mouth daily. Patient not taking: Reported on 07/06/2021 11/19/48 0/93/81  Delora Fuel, MD  TRUEplus Lancets 28G MISC USE TO MEASURE BLOOD SUGAR TWICE A DAY 11/29/20 11/29/21  [name], MD  gabapentin (NEURONTIN) 300 MG capsule Take 1 capsule (300 mg total) by mouth 3 (three) times daily. Patient not taking: Reported on 07/27/2019 07/29/18 08/30/19  [name], FNP  insulin detemir (LEVEMIR FLEXTOUCH) 100 UNIT/ML FlexPen Inject 35 Units into the skin daily. Patient not taking: Reported on 02/17/2021 11/29/20 02/24/21  [name], MD  sildenafil (VIAGRA) 25 MG tablet Take 1 tablet (25 mg total) by mouth as needed for erectile dysfunction. for erectile dysfunction. Patient  not taking: Reported on 08/11/2020 10/13/19 11/27/20  [name], FNP  sitaGLIPtin (JANUVIA) 25 MG tablet Take 1 tablet (25 mg total) by mouth daily. Patient not taking: Reported on 08/11/2020 02/22/20 11/27/20  [name], MD      Allergies    Sulfa antibiotics, Keflex [cephalexin], Other, and Bactrim [sulfamethoxazole-trimethoprim]    Review of Systems   Review of Systems  Constitutional: Negative.   HENT: Negative.    Respiratory:  Positive for shortness of breath.    Cardiovascular:  Positive for leg swelling.  Gastrointestinal: Negative.   Genitourinary: Negative.   Musculoskeletal: Negative.   Neurological: Negative.   Hematological: Negative.   Psychiatric/Behavioral: Negative.     Physical Exam Updated Vital Signs BP (!) 189/112   Pulse 87   Temp 98.1 F (36.7 C) (Oral)   Resp 13   SpO2 100%  Physical Exam Vitals and nursing note reviewed.  Constitutional:      Appearance: He is not ill-appearing or toxic-appearing.  HENT:     Head: Normocephalic and atraumatic.     Nose: Nose normal.     Mouth/Throat:     Mouth: Mucous membranes are moist.     Pharynx: No oropharyngeal exudate or posterior oropharyngeal erythema.  Eyes:     General:        Right eye: No discharge.        Left eye: No discharge.     Conjunctiva/sclera: Conjunctivae normal.  Cardiovascular:     Rate and Rhythm: Normal rate and regular rhythm.     Pulses: Normal pulses.     Heart sounds: Normal heart sounds.     Comments: Edema to the knees bilaterally  Pulmonary:     Effort: Pulmonary effort is normal. No respiratory distress.     Breath sounds: Normal breath sounds. No wheezing or rales.  Abdominal:     General: Bowel sounds are normal. There is no distension.     Palpations: Abdomen is soft.     Tenderness: There is no abdominal tenderness. There is no right CVA tenderness, left CVA tenderness, guarding or rebound.  Musculoskeletal:        General: No deformity.     Cervical back: Neck supple.     Right lower leg: 3+ Pitting Edema present.     Left lower leg: 3+ Pitting Edema present.  Skin:    General: Skin is warm and dry.     Capillary Refill: Capillary refill takes less than 2 seconds.  Neurological:     General: No focal deficit present.     Mental Status: He is alert and oriented to person, place, and time. Mental status is at baseline.  Psychiatric:        Mood and Affect: Mood normal.    ED Results / Procedures / Treatments   Labs (all  labs ordered are listed, but only abnormal results are displayed) Labs Reviewed  CBC WITH DIFFERENTIAL/PLATELET - Abnormal; Notable for the following components:      Result Value   RBC 3.55 (*)    Hemoglobin 9.8 (*)    HCT 32.4 (*)    RDW 16.9 (*)    All other components within normal limits  COMPREHENSIVE METABOLIC PANEL - Abnormal; Notable for the following components:   Glucose, Bld 122 (*)    BUN 33 (*)    Creatinine, Ser 6.78 (*)    Calcium 7.9 (*)    Albumin 2.4 (*)    AST 43 (*)  ALT 67 (*)    Alkaline Phosphatase 336 (*)    GFR, Estimated 9 (*)    All other components within normal limits  BRAIN NATRIURETIC PEPTIDE - Abnormal; Notable for the following components:   B Natriuretic Peptide >4,500.0 (*)    All other components within normal limits  MAGNESIUM    EKG EKG Interpretation  Date/Time:  Friday Oct 12 2021 03:03:10 EDT Ventricular Rate:  86 PR Interval:  138 QRS Duration: 155 QT Interval:  442 QTC Calculation: 529 R Axis:   175 Text Interpretation: Sinus rhythm Probable left atrial enlargement Left bundle branch block Confirmed by Palumbo, April ([id number]) on 10/12/2021 3:13:24 AM  Radiology No results found.  Procedures Procedures    Medications Ordered in ED Medications - No data to display  ED Course/ Medical Decision Making/ A&P Clinical Course as of 10/12/21 0544  Fri Oct 12, 2021  0515 Patient adamantly refusing chest x-ray of any kind.  States that he does not want and does not need it.  Despite extensive discussion regarding recent findings with pulmonary vascular congestion and pneumonia a few days ago and now with increasing BNP, patient still adamantly refusing imaging.  O2 sat normal on baseline supplemental O2 with 2 L. [RS]  3832 Patient refusing any further testing.  Cardiopulmonary exam reassuring, vital signs are reassuring.  Patient is hemodynamically stable.  Will not allow any further intervention or evaluation at this time  therefore given his stable hemodynamics will discharge. [RS]    Clinical Course User Index [RS] Tal Neer, Gypsy Balsam, PA-C                           Medical Decision Making 51 year old male with ESRD on dialysis who presents with request for place to sleep.  Endorses shortness of breath and leg swelling but states that these are his baseline.  Hypertensive and tachycardic on intake.,  Tachycardic as well, however was not tachycardic throughout the remainder of her stay in the emergency department.  Cardiopulmonary and abdominal exams are benign.  Patient with 3+ pitting edema up to the knees bilaterally but states this is at his baseline.  Amount and/or Complexity of Data Reviewed Labs: ordered.    Details: CBC without leukocytosis and with baseline anemia with hemoglobin of 9.  CMP with baseline creatinine of 6 and no acute electrolyte derangement.  Transaminitis with AST/ALT 43/67, near patient's baseline.  Magnesium is normal, BNP elevated to greater than 4500.    Patient adamantly refusing chest x-ray throughout his stay in the emergency department despite multiple requests to obtain this image.  Saturating normally on his baseline 2 L supplemental oxygen by nasal cannula.  EKG is nonischemic does have left bundle branch block.  Overall patient is hemodynamically stable refusing any further intervention or work-up in the emergency department tonight.  Given reassuring hemodynamics and patient who is endorsing symptoms at his baseline, no further work-up is warranted in the ER at this time will discharge patient from the emergency department and recommend he continues to adhere to previously scheduled dialysis sessions and return to the ER with any new severe symptoms.    Ulysee voiced understanding of his medical evaluation and treatment plan. Each of his questions was answered to his expressed satisfaction.  Return precautions were given.  Patient is well-appearing, stable, and was  discharged in good condition.  This chart was dictated using voice recognition software, Dragon. Despite the best efforts of this provider to  proofread and correct errors, errors may still occur which can change documentation meaning.  Final Clinical Impression(s) / ED Diagnoses Final diagnoses:  Bilateral leg pain    Rx / DC Orders ED Discharge Orders     None         Aura Dials 10/12/21 0544    Palumbo, April, MD 10/12/21 [phone]

## 2021-10-12 NOTE — ED Notes (Addendum)
This Probation officer was asked by the pt to give him food and drink. I then requested to the pt to let me take his CBG in order to check it as there was no lab work at the time. Pt refused after I informed him it was needed for safety precautions in respect to his diabetes. The ED provider was notified and stated it was okay for the patient to eat and drink. I provided a sandwich, apple sauce, and apple juice.

## 2021-10-12 NOTE — ED Notes (Signed)
This RN enters to ask if she can collect lab work and educates the patient on the reasons it would benefit him to have an IV placed now. Despite best efforts to educate the patient the patient refuses and demands to speak with someone who can help him find a place to stay.

## 2021-10-12 NOTE — ED Notes (Signed)
E-signature pad unavailable at time of pt discharge. This RN discussed discharge materials with pt and answered all pt questions. Pt stated understanding of discharge material. ? ?

## 2021-10-12 NOTE — Discharge Instructions (Signed)
Your labs were reassuring today. Please continue to follow up with your dialysis as scheduled. Return to the ER with any new severe symptoms.

## 2021-10-12 NOTE — ED Notes (Signed)
Pt refusing x-ray to portable x-ray tech
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
## 2021-10-12 NOTE — ED Notes (Signed)
ED provider at pt bedside at this time

## 2021-10-13 ENCOUNTER — Encounter (HOSPITAL_COMMUNITY): Payer: Self-pay | Admitting: Emergency Medicine

## 2021-10-13 ENCOUNTER — Other Ambulatory Visit: Payer: Self-pay

## 2021-10-13 ENCOUNTER — Emergency Department (HOSPITAL_COMMUNITY): Payer: Medicaid Other

## 2021-10-13 ENCOUNTER — Observation Stay (HOSPITAL_COMMUNITY)
Admission: EM | Admit: 2021-10-13 | Discharge: 2021-10-13 | Disposition: A | Discharge status: Home / Self Care | Payer: Medicaid Other | Attending: Emergency Medicine | Admitting: Emergency Medicine

## 2021-10-13 DIAGNOSIS — I132 Hypertensive heart and chronic kidney disease with heart failure and with stage 5 chronic kidney disease, or end stage renal disease: Secondary | ICD-10-CM | POA: Diagnosis not present

## 2021-10-13 DIAGNOSIS — E119 Type 2 diabetes mellitus without complications: Secondary | ICD-10-CM | POA: Insufficient documentation

## 2021-10-13 DIAGNOSIS — N186 End stage renal disease: Secondary | ICD-10-CM | POA: Diagnosis not present

## 2021-10-13 DIAGNOSIS — Z992 Dependence on renal dialysis: Secondary | ICD-10-CM | POA: Insufficient documentation

## 2021-10-13 DIAGNOSIS — I509 Heart failure, unspecified: Secondary | ICD-10-CM

## 2021-10-13 DIAGNOSIS — Z5900 Homelessness unspecified: Secondary | ICD-10-CM

## 2021-10-13 DIAGNOSIS — I5043 Acute on chronic combined systolic (congestive) and diastolic (congestive) heart failure: Secondary | ICD-10-CM | POA: Diagnosis not present

## 2021-10-13 DIAGNOSIS — Z794 Long term (current) use of insulin: Secondary | ICD-10-CM | POA: Insufficient documentation

## 2021-10-13 DIAGNOSIS — J810 Acute pulmonary edema: Secondary | ICD-10-CM

## 2021-10-13 DIAGNOSIS — Z79899 Other long term (current) drug therapy: Secondary | ICD-10-CM | POA: Insufficient documentation

## 2021-10-13 DIAGNOSIS — Z9981 Dependence on supplemental oxygen: Secondary | ICD-10-CM

## 2021-10-13 DIAGNOSIS — E1122 Type 2 diabetes mellitus with diabetic chronic kidney disease: Secondary | ICD-10-CM

## 2021-10-13 DIAGNOSIS — Z7901 Long term (current) use of anticoagulants: Secondary | ICD-10-CM | POA: Diagnosis not present

## 2021-10-13 DIAGNOSIS — N62 Hypertrophy of breast: Secondary | ICD-10-CM | POA: Diagnosis not present

## 2021-10-13 DIAGNOSIS — R0789 Other chest pain: Secondary | ICD-10-CM | POA: Diagnosis present

## 2021-10-13 DIAGNOSIS — R2243 Localized swelling, mass and lump, lower limb, bilateral: Secondary | ICD-10-CM | POA: Insufficient documentation

## 2021-10-13 DIAGNOSIS — I120 Hypertensive chronic kidney disease with stage 5 chronic kidney disease or end stage renal disease: Secondary | ICD-10-CM

## 2021-10-13 DIAGNOSIS — J9611 Chronic respiratory failure with hypoxia: Secondary | ICD-10-CM | POA: Diagnosis not present

## 2021-10-13 DIAGNOSIS — I10 Essential (primary) hypertension: Secondary | ICD-10-CM

## 2021-10-13 DIAGNOSIS — R600 Localized edema: Secondary | ICD-10-CM | POA: Diagnosis present

## 2021-10-13 DIAGNOSIS — J9601 Acute respiratory failure with hypoxia: Secondary | ICD-10-CM

## 2021-10-13 HISTORY — DX: Homelessness unspecified: Z59.00

## 2021-10-13 LAB — CBC
HCT: 32.6 % — ABNORMAL LOW (ref 39.0–52.0)
Hemoglobin: 10 g/dL — ABNORMAL LOW (ref 13.0–17.0)
MCH: 27.8 pg (ref 26.0–34.0)
MCHC: 30.7 g/dL (ref 30.0–36.0)
MCV: 90.6 fL (ref 80.0–100.0)
Platelets: 413 10*3/uL — ABNORMAL HIGH (ref 150–400)
RBC: 3.6 MIL/uL — ABNORMAL LOW (ref 4.22–5.81)
RDW: 17.1 % — ABNORMAL HIGH (ref 11.5–15.5)
WBC: 6.2 10*3/uL (ref 4.0–10.5)
nRBC: 0.3 % — ABNORMAL HIGH (ref 0.0–0.2)

## 2021-10-13 LAB — TROPONIN I (HIGH SENSITIVITY)
Troponin I (High Sensitivity): 145 ng/L (ref <18)
Troponin I (High Sensitivity): 138 ng/L (ref <18)

## 2021-10-13 LAB — BASIC METABOLIC PANEL
Anion gap: 14 (ref 5–15)
BUN: 40 mg/dL — ABNORMAL HIGH (ref 6–20)
CO2: 25 mmol/L (ref 22–32)
Calcium: 7.8 mg/dL — ABNORMAL LOW (ref 8.9–10.3)
Chloride: 101 mmol/L (ref 98–111)
Creatinine, Ser: 7.6 mg/dL — ABNORMAL HIGH (ref 0.61–1.24)
GFR, Estimated: 8 mL/min — ABNORMAL LOW (ref >60)
Glucose, Bld: 179 mg/dL — ABNORMAL HIGH (ref 70–99)
Potassium: 4 mmol/L (ref 3.5–5.1)
Sodium: 140 mmol/L (ref 135–145)

## 2021-10-13 LAB — BRAIN NATRIURETIC PEPTIDE: B Natriuretic Peptide: >4500 pg/mL — ABNORMAL HIGH (ref 0.0–100.0)

## 2021-10-13 MED ORDER — INSULIN ASPART 100 UNIT/ML IJ SOLN: 0.0000 [IU] | Freq: Four times a day (QID) | INTRAMUSCULAR | Status: DC

## 2021-10-13 MED ORDER — CARVEDILOL 12.5 MG PO TABS
12.5000 mg | ORAL_TABLET | Freq: Two times a day (BID) | ORAL | Status: DC
  Administered 2021-10-13: 12.5 mg via ORAL
  Filled 2021-10-13 (×2): qty 1

## 2021-10-13 MED ORDER — MIDODRINE HCL 5 MG PO TABS: 5.0000 mg | ORAL_TABLET | Freq: Once | ORAL | Status: DC

## 2021-10-13 MED ORDER — FUROSEMIDE 10 MG/ML IJ SOLN
40.0000 mg | Freq: Once | INTRAMUSCULAR | Status: AC
  Administered 2021-10-13: 40 mg via INTRAVENOUS
  Filled 2021-10-13: qty 4

## 2021-10-13 MED ORDER — SODIUM CHLORIDE 0.9 % IV SOLN: 100.0000 mL | INTRAVENOUS | Status: DC | PRN

## 2021-10-13 MED ORDER — LIDOCAINE HCL (PF) 1 % IJ SOLN: 5.0000 mL | INTRAMUSCULAR | Status: DC | PRN

## 2021-10-13 MED ORDER — HYDRALAZINE HCL 20 MG/ML IJ SOLN
10.0000 mg | Freq: Once | INTRAMUSCULAR | Status: DC
Start: 2021-10-13 — End: 2021-10-14

## 2021-10-13 MED ORDER — HEPARIN SODIUM (PORCINE) 1000 UNIT/ML DIALYSIS: 2000.0000 [IU] | Freq: Once | INTRAMUSCULAR | Status: DC

## 2021-10-13 MED ORDER — CARVEDILOL 12.5 MG PO TABS
12.5000 mg | ORAL_TABLET | Freq: Two times a day (BID) | ORAL | 0 refills | Status: DC
  Filled 2021-10-13: qty 60, 30d supply, fill #0

## 2021-10-13 MED ORDER — CALCITRIOL 0.5 MCG PO CAPS: 0.7500 ug | ORAL_CAPSULE | ORAL | Status: DC

## 2021-10-13 MED ORDER — LIDOCAINE-PRILOCAINE 2.5-2.5 % EX CREA
1.0000 "application " | TOPICAL_CREAM | CUTANEOUS | Status: DC | PRN
  Filled 2021-10-13: qty 5

## 2021-10-13 MED ORDER — AMLODIPINE BESYLATE 10 MG PO TABS
10.0000 mg | ORAL_TABLET | Freq: Every day | ORAL | 0 refills | Status: DC
  Filled 2021-10-13: qty 30, 30d supply, fill #0

## 2021-10-13 MED ORDER — ACETAMINOPHEN 650 MG RE SUPP: 650.0000 mg | RECTAL | Status: DC | PRN

## 2021-10-13 MED ORDER — NITROGLYCERIN 2 % TD OINT
0.5000 [in_us] | TOPICAL_OINTMENT | Freq: Four times a day (QID) | TRANSDERMAL | Status: AC
  Filled 2021-10-13: qty 30

## 2021-10-13 MED ORDER — HYDRALAZINE HCL 25 MG PO TABS
25.0000 mg | ORAL_TABLET | Freq: Three times a day (TID) | ORAL | 0 refills | Status: DC
  Filled 2021-10-13: qty 90, 30d supply, fill #0

## 2021-10-13 MED ORDER — HEPARIN SODIUM (PORCINE) 5000 UNIT/ML IJ SOLN: 5000.0000 [IU] | Freq: Three times a day (TID) | INTRAMUSCULAR | Status: DC

## 2021-10-13 MED ORDER — PENTAFLUOROPROP-TETRAFLUOROETH EX AERO: 1.0000 "application " | INHALATION_SPRAY | CUTANEOUS | Status: DC | PRN

## 2021-10-13 MED ORDER — AMLODIPINE BESYLATE 5 MG PO TABS
10.0000 mg | ORAL_TABLET | Freq: Once | ORAL | Status: AC
  Administered 2021-10-13: 10 mg via ORAL
  Filled 2021-10-13: qty 2

## 2021-10-13 MED ORDER — MIDODRINE HCL 5 MG PO TABS
ORAL_TABLET | ORAL | Status: AC
  Filled 2021-10-13: qty 1

## 2021-10-13 NOTE — Progress Notes (Incomplete)
Pt being discharged at this time. Compliant with limited physical assessment. Vital signs stable. No complaints of pain. Given dinner and shower. Belongings, including cell phone with pt. Discharge paperwork reviewed and given to pt. No distress noted.

## 2021-10-13 NOTE — Discharge Summary (Signed)
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 *  Hospital Course: [name] is a 51 y.o. male with a history of ESRD on HD, diabetes, chronic respiratory failure, homelessness. Patient presented secondary to shortness of breath and chest pain in setting of hypertensive urgency. Blood pressure managed with home antihypertensives.  Assessment and Plan: * Acute on chronic combined systolic and diastolic CHF (congestive heart failure) (HCC) Associated acute pulmonary edema. Nephrology consulted for hemodialysis. Asymptomatic on day of discharge.  ESRD on dialysis [facility]) Nephrology consulted for hemodialysis. Patient underwent hemodialysis prior to discharge.  Acute pulmonary edema (HCC) Due to volume overload from CHF and end-stage renal disease. Managed with HD.  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  Chronic respiratory failure with hypoxia, on home O2 therapy (HCC) - 2 L/min Not requiring oxygen this admission.  Homelessness Chronic.  Social worker consulted for shelter information.  Lower extremity edema Chronic. Stable.  Essential hypertension Not adherent to regimen. Continue Coreg, hydralazine and amlodipine.    Consultants:  Nephrology Procedures performed: Hemodialysis  Disposition:  Previous living environment Diet recommendation: Renal diet  DISCHARGE MEDICATION: Allergies as of 10/13/2021       Reactions   Sulfa Antibiotics Rash   Severe rash.    Keflex [cephalexin] Other (See Comments)   Patient with severe drug reaction including exfoliating skin rash and hypotension after being prescribed both cephalexin and sulfamethoxazole-trimethoprim simultaneously. Favor SMX as much more likely culprit as patient had tolerated other cephalosporins in the past, but cannot say with complete certainty that this was not related to cephalexin.    Other Other (See Comments)   Patient states he is allergic to the TB test - unknown reaction   Bactrim [sulfamethoxazole-trimethoprim] Rash        Medication List     STOP taking these medications    doxycycline 100 MG tablet Commonly known as: VIBRA-TABS       TAKE these medications    Accu-Chek Guide test strip Generic drug: glucose blood Use to check fasting blood sugar once daily. 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 Insulin dependent   Accu-Chek Softclix Lancets lancets USE TO MEASURE BLOOD SUGAR TWICE A DAY   amLODipine 10 MG tablet Commonly known as: NORVASC Take 1 tablet (10 mg total) by mouth daily.   atorvastatin 40 MG tablet Commonly known as: LIPITOR Take 1 tablet (40 mg total) by mouth daily.   Basaglar KwikPen 100 UNIT/ML Inject 10 Units into the skin daily. May substitute with any other long-acting formulary.   calcitRIOL 0.25 MCG capsule Commonly known as: ROCALTROL Take 1 capsule (0.25 mcg total) by mouth daily.   calcium gluconate 500 MG tablet Take 1 tablet (500 mg total) by mouth 3 (three) times daily.   carvedilol 12.5  MG tablet Commonly known as: COREG Take 1 tablet (12.5 mg total) by mouth 2 (two) times daily with a meal. What changed:  how much to take how to take this when to take this   hydrALAZINE 25 MG tablet Commonly known as:  APRESOLINE Take 1 tablet (25 mg total) by mouth 3 (three) times daily.   NovoLOG FlexPen 100 UNIT/ML FlexPen Generic drug: insulin aspart Inject 5 Units into the skin 2 (two) times daily with a meal.   sertraline 50 MG tablet Commonly known as: ZOLOFT Take 1 tablet (50 mg total) by mouth daily.   TRUEplus 5-Bevel Pen Needles 32G X 4 MM Misc Generic drug: Insulin Pen Needle Use with insulin pens        Discharge Exam: BP (!) 165/79 (BP Location: Left Arm)   Pulse 80   Temp 97.9 F (36.6 C) (Oral)   Resp 16   Ht 5\' 7"  (1.702 m)   Wt 77.6 kg   SpO2 (!) 87% Comment: pt refused O2; notified MD.  BMI 26.79 kg/m   General exam: Appears calm and comfortable Respiratory system: Clear to auscultation. Respiratory effort normal. Cardiovascular system: S1 & S2 heard, RRR. Gastrointestinal system: Abdomen is nondistended, soft and nontender. Normal bowel sounds heard. Central nervous system: Alert and oriented. No focal neurological deficits. Musculoskeletal: No calf tenderness Skin: No cyanosis. No rashes Psychiatry: Judgement and insight appear normal. Mood & affect appropriate.   Condition at discharge: stable  The results of significant diagnostics from this hospitalization (including imaging, microbiology, ancillary and laboratory) are listed below for reference.   Imaging Studies: DG Chest 2 View  Result Date: 10/09/2021 CLINICAL DATA:  Shortness of breath, chest pain EXAM: CHEST - 2 VIEW COMPARISON:  Multiple priors, most recently 10/09/2021 at 0155 hours FINDINGS: Patchy right lower lobe/infrahilar opacities, unchanged from recent priors, improved from May 12. Mild linear opacity in the lingula, likely atelectasis. No pleural effusion or pneumothorax. Cardiomegaly.  No frank interstitial edema. IMPRESSION: Patchy right lower lobe/infrahilar opacities, unchanged from recent priors, improved from May 12. Electronically Signed   By: Julian Hy M.D.   On: 10/09/2021  18:30   DG Chest 2 View  Result Date: 10/09/2021 CLINICAL DATA:  Dyspnea. EXAM: CHEST - 2 VIEW COMPARISON:  10/04/2021. FINDINGS: The heart is markedly enlarged the mediastinal contour stable. The pulmonary vasculature is stable. Stable airspace opacities are noted in the infrahilar region on the right. No consolidation, effusion, or pneumothorax. No acute osseous abnormality. IMPRESSION: Airspace opacities in the infrahilar region on the right, unchanged from the prior exam. Electronically Signed   By: Brett Fairy M.D.   On: 10/09/2021 02:20   DG Chest 2 View  Result Date: 10/04/2021 CLINICAL DATA:  Chest pain, initial encounter EXAM: CHEST - 2 VIEW COMPARISON:  10/01/2018 FINDINGS: Cardiac shadow is enlarged but stable. Persistent but improving airspace opacity is noted in the right lung base. No new focal abnormality is noted. IMPRESSION: Improved aeration in the right base. Electronically Signed   By: Inez Catalina M.D.   On: 10/04/2021 01:04   DG Chest 2 View  Result Date: 09/20/2021 CLINICAL DATA:  Shortness of breath EXAM: CHEST - 2 VIEW COMPARISON:  07/06/2021 FINDINGS: Cardiomegaly with vascular congestion and mild interstitial edema. No pleural effusion. Patchy basilar opacities could be due to atelectasis or mild pneumonia. No pneumothorax. IMPRESSION: 1. Cardiomegaly with vascular congestion and interstitial edema 2. Patchy basilar and left mid lung opacities could reflect atelectasis or mild pneumonia. Electronically Signed  By: Donavan Foil M.D.   On: 09/20/2021 21:57   CT Chest W Contrast  Result Date: 10/10/2021 CLINICAL DATA:  Shortness of breath nodules beneath the nipple chest pain EXAM: CT CHEST WITH CONTRAST TECHNIQUE: Multidetector CT imaging of the chest was performed during intravenous contrast administration. RADIATION DOSE REDUCTION: This exam was performed according to the departmental dose-optimization program which includes automated exposure control, adjustment of the  mA and/or kV according to patient size and/or use of iterative reconstruction technique. CONTRAST:  8mL OMNIPAQUE IOHEXOL 300 MG/ML  SOLN COMPARISON:  Chest x-ray 10/09/2021, 09/20/2021, 10/04/2021 FINDINGS: Cardiovascular: Nonaneurysmal aorta. Cardiomegaly. Small pericardial effusion Mediastinum/Nodes: Midline trachea. No suspicious thyroid mass. Borderline prevascular node measuring 10 mm. Small right hilar nodes measuring 10 mm. Esophagus within normal limits Lungs/Pleura: No pneumothorax. Trace right-sided pleural effusion. Mild right middle lobe bronchiectasis with bronchial wall thickening and streaky consolidations. Similar finding at the lingula to a lesser extent. Upper Abdomen: No acute abnormality. Musculoskeletal: Generalized subcutaneous edema. No acute osseous abnormality. Mild bilateral gynecomastia IMPRESSION: 1. Cardiomegaly with small pericardial effusion and trace right pleural effusion 2. Mild right middle lobe bronchiectasis with mild bronchial wall thickening and streaky airspace disease at the right middle lobe, suspect for pneumonia residual. Short interval CT follow-up to resolution recommended 3. Borderline mediastinal and right hilar nodes likely reactive 4. Mild bilateral gynecomastia which may account for the nodules beneath the nipples described in the history Electronically Signed   By: Donavan Foil M.D.   On: 10/10/2021 00:05   DG Chest Portable 1 View  Result Date: 10/13/2021 CLINICAL DATA:  Shortness of breath, chest pain EXAM: PORTABLE CHEST 1 VIEW COMPARISON:  10/10/2018 FINDINGS: Cardiomegaly. Vascular congestion and diffuse bilateral airspace disease, likely edema. No effusions or acute bony abnormality. IMPRESSION: Cardiomegaly, moderate CHF. Electronically Signed   By: Rolm Baptise M.D.   On: 10/13/2021 03:26   DG CHEST PORT 1 VIEW  Result Date: 09/30/2021 CLINICAL DATA:  Respiratory failure for hypoxemia. EXAM: PORTABLE CHEST 1 VIEW COMPARISON:  09/28/2021 FINDINGS:  Stable cardiac enlargement. No signs of pleural effusion. Airspace consolidation within the right mid lung is decreased from previous exam. Left lung appears clear. Interstitial edema pattern is also improved in the interval. IMPRESSION: Interval improvement in right midlung airspace opacity and mild interstitial edema. Electronically Signed   By: Kerby Moors M.D.   On: 09/30/2021 08:36   DG Chest Portable 1 View  Result Date: 09/28/2021 CLINICAL DATA:  Shortness of breath right from dialysis. Respiratory distress EXAM: PORTABLE CHEST 1 VIEW COMPARISON:  Chest radiograph Sep 20, 2021 FINDINGS: Cardiomegaly. Consolidative opacity in the right middle and lower lobe. Patchy bilateral interstitial opacities. No visible pleural effusion or pneumothorax. Thoracic spondylosis with degenerative changes shoulders. IMPRESSION: 1. Consolidative opacity in the right middle and lower lobe. Findings may represent pneumonia versus asymmetric edema. 2. Cardiomegaly with patchy interstitial opacities likely reflects interstitial edema. Electronically Signed   By: Dahlia Bailiff M.D.   On: 09/28/2021 10:59   DG Abd Portable 2 Views  Result Date: 09/28/2021 CLINICAL DATA:  Abdominal pain EXAM: PORTABLE ABDOMEN - 2 VIEW COMPARISON:  None Available. FINDINGS: The bowel gas pattern is normal. There is no evidence of free air. Centralized bowel loops may reflect ascites. IMPRESSION: 1. Nonobstructive bowel gas pattern. 2. Centralized bowel loops may reflect ascites. Electronically Signed   By: Dahlia Bailiff M.D.   On: 09/28/2021 10:59    Microbiology: Results for orders placed or performed during the hospital encounter of  09/28/21  Blood culture (routine x 2)     Status: None   Collection Time: 09/28/21 10:55 PM   Specimen: BLOOD  Result Value Ref Range Status   Specimen Description BLOOD LEFT ANTECUBITAL  Final   Special Requests   Final    BOTTLES DRAWN AEROBIC AND ANAEROBIC Blood Culture adequate volume   Culture    Final    NO GROWTH 5 DAYS Performed at [facility], [address] [place], [place] [address]    Report Status 10/03/2021 FINAL  Final  Blood culture (routine x 2)     Status: None   Collection Time: 09/28/21 10:55 PM   Specimen: BLOOD LEFT HAND  Result Value Ref Range Status   Specimen Description BLOOD LEFT HAND  Final   Special Requests   Final    BOTTLES DRAWN AEROBIC ONLY Blood Culture results may not be optimal due to an inadequate volume of blood received in culture bottles   Culture   Final    NO GROWTH 5 DAYS Performed at [facility], [place] [address], [place], [place] [address]    Report Status 10/03/2021 FINAL  Final  Resp Panel by RT-PCR (Flu A&B, Covid) Nasopharyngeal Swab     Status: None   Collection Time: 09/28/21 11:20 PM   Specimen: Nasopharyngeal Swab; Nasopharyngeal(NP) swabs in vial transport medium  Result Value Ref Range Status   SARS Coronavirus 2 by RT PCR NEGATIVE NEGATIVE Final    Comment: (NOTE) SARS-CoV-2 target nucleic acids are NOT DETECTED.  The SARS-CoV-2 RNA is generally detectable in upper respiratory specimens during the acute phase of infection. The lowest concentration of SARS-CoV-2 viral copies this assay can detect is 138 copies/mL. A negative result does not preclude SARS-Cov-2 infection and should not be used as the sole basis for treatment or other patient management decisions. A negative result may occur with  improper specimen collection/handling, submission of specimen other than nasopharyngeal swab, presence of viral mutation(s) within the areas targeted by this assay, and inadequate number of viral copies(<138 copies/mL). A negative result must be combined with clinical observations, patient history, and epidemiological information. The expected result is Negative.  Fact Sheet for Patients:  EntrepreneurPulse.com.au  Fact Sheet for Healthcare Providers:   IncredibleEmployment.be  This test is no t yet approved or cleared by the [place] FDA and  has been authorized for detection and/or diagnosis of SARS-CoV-2 by FDA under an Emergency Use Authorization (EUA). This EUA will remain  in effect (meaning this test can be used) for the duration of the COVID-19 declaration under Section 564(b)(1) of the Act, 21 U.S.C.section 360bbb-3(b)(1), unless the authorization is terminated  or revoked sooner.       Influenza A by PCR NEGATIVE NEGATIVE Final   Influenza B by PCR NEGATIVE NEGATIVE Final    Comment: (NOTE) The Xpert Xpress SARS-CoV-2/FLU/RSV plus assay is intended as an aid in the diagnosis of influenza from Nasopharyngeal swab specimens and should not be used as a sole basis for treatment. Nasal washings and aspirates are unacceptable for Xpert Xpress SARS-CoV-2/FLU/RSV testing.  Fact Sheet for Patients: EntrepreneurPulse.com.au  Fact Sheet for Healthcare Providers: IncredibleEmployment.be  This test is not yet approved or cleared by the [place] FDA and has been authorized for detection and/or diagnosis of SARS-CoV-2 by FDA under an Emergency Use Authorization (EUA). This EUA will remain in effect (meaning this test can be used) for the duration of the COVID-19 declaration under Section 564(b)(1) of the Act,  21 U.S.C. section 360bbb-3(b)(1), unless the authorization is terminated or revoked.  Performed at [facility], [place] [address] [place], [place] [address]   MRSA Next Gen by PCR, Nasal     Status: None   Collection Time: 09/28/21 11:26 PM   Specimen: Nasal Mucosa; Nasal Swab  Result Value Ref Range Status   MRSA by PCR Next Gen NOT DETECTED NOT DETECTED Final    Comment: (NOTE) The GeneXpert MRSA Assay (FDA approved for NASAL specimens only), is one component of a comprehensive MRSA colonization surveillance program. It is not intended to diagnose  MRSA infection nor to guide or monitor treatment for MRSA infections. Test performance is not FDA approved in patients less than 41 years old. Performed at [facility], [place] [address] [place], [place] [address]     Labs: CBC: Recent Labs  Lab 10/09/21 0154 10/09/21 1800 10/12/21 0328 10/13/21 0027  WBC 7.5 6.5 6.4 6.2  NEUTROABS 5.1 4.3 4.3  --   HGB 9.5* 9.6* 9.8* 10.0*  HCT 31.2* 30.9* 32.4* 32.6*  MCV 90.4 91.2 91.3 90.6  PLT 360 348 386 620*   Basic Metabolic Panel: Recent Labs  Lab 10/09/21 0154 10/09/21 1800 10/09/21 1950 10/12/21 0328 10/13/21 0027  NA 138 137  --  136 140  K 4.5 4.3  --  3.5 4.0  CL 104 103  --  100 101  CO2 22 20*  --  24 25  GLUCOSE 160* 162*  --  122* 179*  BUN 59* 64*  --  33* 40*  CREATININE 9.18* 9.46*  --  6.78* 7.60*  CALCIUM 6.4* 6.5*  --  7.9* 7.8*  MG  --   --  2.3 2.0  --    Liver Function Tests: Recent Labs  Lab 10/09/21 0154 10/12/21 0328  AST 83* 43*  ALT 76* 67*  ALKPHOS 355* 336*  BILITOT 0.8 0.4  PROT 7.0 6.8  ALBUMIN 2.4* 2.4*    Signed: Cordelia Poche, MD [facility] 10/13/2021

## 2021-10-13 NOTE — H&P (Addendum)
History and Physical    [name]:[id number] DOB: [birth year] DOA: 10/13/2021  DOS: the patient was seen and examined on 10/13/2021  PCP: Vevelyn Francois, NP   Patient coming from: Home  I have personally briefly reviewed patient's old medical records in [place]  CC: chest pain, SOB HPI: 51 year old African-American male history of end-stage renal disease on dialysis Tuesday Thursday Saturday, type 2 diabetes, chronic hypoxic respiratory failure on 2 L of oxygen, hypertension, chronic homelessness presents to the ER with chest pain and shortness of breath.  He was evaluated on 10/12/2021.  He refused chest x-ray and other evaluation.  He was discharged from the ER.  He returned this morning for complaints of chest pain.  He is noted to be hypoxic yesterday.  Was on 2 L of oxygen.  ER note from yesterday reports that this is chronic for him that he requires home O2.  Patient refusing to speak to this Probation officer.  Labs showed a BNP greater than 4500  BUN of 40, creatinine 7.6  Chest x-ray shows diffuse pulmonary edema.  [facility] hospitalist contacted for admission.     ED Course: Hypoxic in the ER.  Placed on 2 L.  Also hypertensive.  Chest x-ray shows pulmonary edema.  Review of Systems:  Review of Systems  Unable to perform ROS: Other  pt refused to speak to this Probation officer  Past Medical History:  Diagnosis Date   Anxiety 02/2019   Anxiety 10/2019   Diabetes mellitus type II, uncontrolled 07/17/2006   Elevated alkaline phosphatase level 01/2019   ESSENTIAL HYPERTENSION 07/17/2006   HYPERLIPIDEMIA 11/24/2008   Insomnia 02/2019   Left bundle branch block 02/22/2019   Lesion of lip 10/2019   Pneumonia 10/22/2011   Suicidal ideations 02/2019   Vitamin D deficiency 01/2019    Past Surgical History:  Procedure Laterality Date   ARM SURGERY     AV FISTULA PLACEMENT Right 02/20/2021   Procedure: RIGHT ARTERIOVENOUS GRAFT CREATION;  Surgeon: Angelia Mould, MD;   Location: [facility];  Service: Vascular;  Laterality: Right;   INSERTION OF DIALYSIS CATHETER Left 02/20/2021   Procedure: INSERTION OF DIALYSIS CATHETER USING PALINDROME CATHETER;  Surgeon: Angelia Mould, MD;  Location: [facility];  Service: Vascular;  Laterality: Left;   IR REMOVAL TUN CV CATH W/O FL  07/27/2021   ULTRASOUND GUIDANCE FOR VASCULAR ACCESS  02/20/2021   Procedure: ULTRASOUND GUIDANCE FOR VASCULAR ACCESS;  Surgeon: Angelia Mould, MD;  Location: [facility];  Service: Vascular;;     reports that he has never smoked. He has never used smokeless tobacco. He reports that he does not drink alcohol and does not use drugs.  Allergies  Allergen Reactions   Sulfa Antibiotics Rash    Severe rash.    Keflex [Cephalexin] Other (See Comments)    Patient with severe drug reaction including exfoliating skin rash and hypotension after being prescribed both cephalexin and sulfamethoxazole-trimethoprim simultaneously. Favor SMX as much more likely culprit as patient had tolerated other cephalosporins in the past, but cannot say with complete certainty that this was not related to cephalexin.    Other Other (See Comments)    Patient states he is allergic to the TB test - unknown reaction   Bactrim [Sulfamethoxazole-Trimethoprim] Rash    Family History  Problem Relation Age of Onset   Hypertension Mother 70   Cancer Mother        bladder cancer   Alcohol abuse Father    Hypertension  Brother     Prior to Admission medications   Medication Sig Start Date End Date Taking? Authorizing Provider  amLODipine (NORVASC) 10 MG tablet Take 1 tablet by mouth once a day 10/02/21     atorvastatin (LIPITOR) 40 MG tablet Take 1 tablet (40 mg total) by mouth daily. Patient not taking: Reported on 1/61/0960 4/54/09   Delora Fuel, MD  Blood Glucose Monitoring Suppl (ACCU-CHEK GUIDE) w/Device KIT Use as directed 11/29/20   [name], MD  Blood Glucose Monitoring Suppl (TRUE METRIX METER) w/Device  KIT Use to measure blood sugar twice a day 02/22/20   [name], MD  calcitRIOL (ROCALTROL) 0.25 MCG capsule Take 1 capsule (0.25 mcg total) by mouth daily. Patient not taking: Reported on 12/28/9145 01/16/55   Delora Fuel, MD  calcium gluconate 500 MG tablet Take 1 tablet (500 mg total) by mouth 3 (three) times daily. Patient taking differently: Take 1 tablet by mouth daily. 07/02/06   Delora Fuel, MD  carvedilol (COREG) 12.5 MG tablet Take 1 tablet by mouth twice a day with a meal 10/02/21     doxycycline (VIBRA-TABS) 100 MG tablet Take 1 tablet (100 mg total) by mouth 2 (two) times daily. 10/10/21   Marcello Fennel, PA-C  glucose blood test strip Use to check fasting blood sugar once daily. 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 Insulin dependent 11/29/20   [name], MD  hydrALAZINE (APRESOLINE) 25 MG tablet Take 1 tablet by mouth three times a day 10/02/21     insulin aspart (NOVOLOG FLEXPEN) 100 UNIT/ML FlexPen Inject 5 Units into the skin 2 (two) times daily with a meal. Patient not taking: Reported on 09/28/2021 02/26/21   [name], [name], MD  insulin glargine (LANTUS) 100 UNIT/ML Solostar Pen Inject 10 Units into the skin daily. May substitute with any other long-acting formulary. Patient not taking: Reported on 09/28/2021 05/13/21   [name], MD  Insulin Pen Needle 32G X 4 MM MISC Use with insulin pens 02/26/21   [name], [name], MD  sertraline (ZOLOFT) 50 MG tablet Take 1 tablet (50 mg total) by mouth daily. Patient not taking: Reported on 07/06/2021 6/57/84 6/96/29  Delora Fuel, MD  TRUEplus Lancets 28G MISC USE TO MEASURE BLOOD SUGAR TWICE A DAY 11/29/20 11/29/21  [name], MD  gabapentin (NEURONTIN) 300 MG capsule Take 1 capsule (300 mg total) by mouth 3 (three) times daily. Patient not taking: Reported on 07/27/2019 07/29/18 08/30/19  [name], FNP  insulin detemir (LEVEMIR FLEXTOUCH) 100 UNIT/ML FlexPen Inject 35 Units into the skin daily. Patient not taking:  Reported on 02/17/2021 11/29/20 02/24/21  [name], MD  sildenafil (VIAGRA) 25 MG tablet Take 1 tablet (25 mg total) by mouth as needed for erectile dysfunction. for erectile dysfunction. Patient not taking: Reported on 08/11/2020 10/13/19 11/27/20  [name], FNP  sitaGLIPtin (JANUVIA) 25 MG tablet Take 1 tablet (25 mg total) by mouth daily. Patient not taking: Reported on 08/11/2020 02/22/20 11/27/20  [name], MD    Physical Exam: Vitals:   10/13/21 0030 10/13/21 0230 10/13/21 0345 10/13/21 0445  BP: (!) 202/108 (!) 199/118 (!) 184/110 (!) 173/93  Pulse: 90 89 83 88  Resp: 18 16 14 10   Temp: 98.5 F (36.9 C)     TempSrc: Oral     SpO2: 90% 100% 100% 100%    Physical Exam Vitals and nursing note reviewed.  Constitutional:      General: He is not in acute  distress.    Appearance: He is not toxic-appearing.     Comments: Appears disheveled  HENT:     Head: Normocephalic and atraumatic.     Nose: Nose normal.  Cardiovascular:     Rate and Rhythm: Normal rate and regular rhythm.     Comments: JVD to the angle of the jaw however patient laying 30 degrees on the gurney. Pulmonary:     Effort: No respiratory distress.     Breath sounds: Rales present.  Abdominal:     General: Bowel sounds are normal. There is no distension.     Tenderness: There is no abdominal tenderness. There is no guarding.  Musculoskeletal:     Right lower leg: Edema present.     Left lower leg: Edema present.     Comments: +2-3 pitting edema of the thighs, pretibial area, feet bilaterally.  Neurological:     Comments: Patient refusing to answer any questions.  Refusing to follow any commands.     Labs on Admission: I have personally reviewed following labs and imaging studies  CBC: Recent Labs  Lab 10/09/21 0154 10/09/21 1800 10/12/21 0328 10/13/21 0027  WBC 7.5 6.5 6.4 6.2  NEUTROABS 5.1 4.3 4.3  --   HGB 9.5* 9.6* 9.8* 10.0*  HCT 31.2* 30.9* 32.4* 32.6*  MCV 90.4 91.2  91.3 90.6  PLT 360 348 386 315*   Basic Metabolic Panel: Recent Labs  Lab 10/09/21 0154 10/09/21 1800 10/09/21 1950 10/12/21 0328 10/13/21 0027  NA 138 137  --  136 140  K 4.5 4.3  --  3.5 4.0  CL 104 103  --  100 101  CO2 22 20*  --  24 25  GLUCOSE 160* 162*  --  122* 179*  BUN 59* 64*  --  33* 40*  CREATININE 9.18* 9.46*  --  6.78* 7.60*  CALCIUM 6.4* 6.5*  --  7.9* 7.8*  MG  --   --  2.3 2.0  --    GFR: Estimated Creatinine Clearance: 12.2 mL/min (A) (by C-G formula based on SCr of 7.6 mg/dL (H)). Liver Function Tests: Recent Labs  Lab 10/09/21 0154 10/12/21 0328  AST 83* 43*  ALT 76* 67*  ALKPHOS 355* 336*  BILITOT 0.8 0.4  PROT 7.0 6.8  ALBUMIN 2.4* 2.4*   No results for input(s): LIPASE, AMYLASE in the last 168 hours. No results for input(s): AMMONIA in the last 168 hours. Coagulation Profile: No results for input(s): INR, PROTIME in the last 168 hours. Cardiac Enzymes: Recent Labs  Lab 10/09/21 0154 10/09/21 1800 10/09/21 1950 10/13/21 0027 10/13/21 0253  TROPONINIHS 193* 162* 166* 145* 138*   BNP (last 3 results) No results for input(s): PROBNP in the last 8760 hours. HbA1C: No results for input(s): HGBA1C in the last 72 hours. CBG: No results for input(s): GLUCAP in the last 168 hours. Lipid Profile: No results for input(s): CHOL, HDL, LDLCALC, TRIG, CHOLHDL, LDLDIRECT in the last 72 hours. Thyroid Function Tests: No results for input(s): TSH, T4TOTAL, FREET4, T3FREE, THYROIDAB in the last 72 hours. Anemia Panel: No results for input(s): VITAMINB12, FOLATE, FERRITIN, TIBC, IRON, RETICCTPCT in the last 72 hours. Urine analysis:    Component Value Date/Time   COLORURINE YELLOW 02/19/2021 0045   APPEARANCEUR HAZY (A) 02/19/2021 0045   APPEARANCEUR Clear 05/09/2020 1509   LABSPEC 1.013 02/19/2021 0045   PHURINE 5.0 02/19/2021 0045   GLUCOSEU >=500 (A) 02/19/2021 0045   HGBUR MODERATE (A) 02/19/2021 0045   BILIRUBINUR NEGATIVE 02/19/2021  0045    BILIRUBINUR Negative 05/09/2020 1509   KETONESUR NEGATIVE 02/19/2021 0045   PROTEINUR >=300 (A) 02/19/2021 0045   UROBILINOGEN 0.2 02/04/2020 1612   UROBILINOGEN 0.2 02/16/2014 0045   NITRITE NEGATIVE 02/19/2021 0045   LEUKOCYTESUR NEGATIVE 02/19/2021 0045    Radiological Exams on Admission: I have personally reviewed images DG Chest Portable 1 View  Result Date: 10/13/2021 CLINICAL DATA:  Shortness of breath, chest pain EXAM: PORTABLE CHEST 1 VIEW COMPARISON:  10/10/2018 FINDINGS: Cardiomegaly. Vascular congestion and diffuse bilateral airspace disease, likely edema. No effusions or acute bony abnormality. IMPRESSION: Cardiomegaly, moderate CHF. Electronically Signed   By: Rolm Baptise M.D.   On: 10/13/2021 03:26    EKG: My personal interpretation of EKG shows: NSR, LBBB    Assessment/Plan Principal Problem:   Acute on chronic combined systolic and diastolic CHF (congestive heart failure) (HCC) Active Problems:   ESRD on dialysis ([place])   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 ([place])   Acute pulmonary edema (HCC)   Essential hypertension   Lower extremity edema   Homelessness   Chronic respiratory failure with hypoxia, on home O2 therapy (HCC) - 2 L/min    Assessment and Plan: * Acute on chronic combined systolic and diastolic CHF (congestive heart failure) ([place]) Admit to observation medical bed.  Pulmonary edema likely due to need for dialysis.  Patient refused to give any history.  I can only assume that he is not following fluid restrictions.  Acute on chronic combined systolic and diastolic CHF (congestive heart failure) (HCC) is a Acute on Chronic illness/condition that poses a threat to life or bodily function.   ESRD on dialysis ([place]) Chronic.  On dialysis Tuesday Thursday Saturday.  EDP to notify nephrology for need for dialysis.  Acute pulmonary edema (HCC) Due to volume overload from CHF and end-stage renal disease.  Type 2 DM with hypertension and  ESRD on dialysis [facility]) Unclear if the patient is taking any insulin at all.  Add sliding scale insulin only.  Chronic respiratory failure with hypoxia, on home O2 therapy (HCC) - 2 L/min Last discharge summary notes the patient has chronic respiratory failure with need for oxygen.  Not sure how he has been getting oxygen if he is homeless.  Homelessness Chronic.  Social work consult for homeless shelter.  Lower extremity edema Patient has 3+ pitting edema.  From review of the ER notes, this is chronic for him.  Essential hypertension Patient hypertensive in the ER.  Unclear if he is taking any meds at all or if he has any medications.  Likely noncompliant.    DVT prophylaxis: SQ Heparin Code Status: Full Code by default Family Communication: no family at bedside Disposition Plan: homeless shelter  Consults called: EDP to consult nephrology  Admission status: Observation, Med-Surg   Kristopher Oppenheim, DO [facility] 10/13/2021, 5:45 AM

## 2021-10-13 NOTE — Assessment & Plan Note (Addendum)
Not adherent to regimen. Continue Coreg, hydralazine and amlodipine.

## 2021-10-13 NOTE — Progress Notes (Signed)
Patient off floor to dialysis

## 2021-10-13 NOTE — Progress Notes (Signed)
Spoke with patient regarding discharge. He states he has no where to go. I informed him that even though he has no where to go, he still has to be discharged per MD and social work. Patient denied offer for bus/cab voucher. I informed the patient that when he is finished with dialysis he can take a shower in his room and eat dinner but then he will be discharged.

## 2021-10-13 NOTE — ED Triage Notes (Addendum)
Pt reported to ED with c/o of left sided chest pain and some shortness of breath. Endorses that he received complete session of last scheduled dialysis treatment on Thursday.  Pt also states that he was seen in this ED yesterday and told the doctor that he wanted to see a social worker about homelessness but was told no Education officer, museum was available. Pt states he called this facility today and was told that there is always a Education officer, museum on duty and he needs to speak to one pertaining his homelessness.

## 2021-10-13 NOTE — Plan of Care (Signed)
Patient arrived on the floor at [phone]. Transferred safely on the bed. Alert orientedX4, no signs of acute distress. refused for vital signs to be checked and refused to answer admission interview questions at this time.

## 2021-10-13 NOTE — ED Notes (Addendum)
Pt refusing IV at this time, EDP notified; pt has removed leads, BP cuff and pulse ox, refuses to put them back on

## 2021-10-13 NOTE — ED Notes (Signed)
Patient oxygen saturation 83-87% RA. Patient placed on 2L. Improved to 90%. Patient placed on 3L. Saturation 100%.

## 2021-10-13 NOTE — ED Notes (Signed)
Charge RN notified of pt troponin 145.

## 2021-10-13 NOTE — Assessment & Plan Note (Addendum)
-  Continue insulin ?

## 2021-10-13 NOTE — Assessment & Plan Note (Addendum)
Due to volume overload from CHF and end-stage renal disease. Managed with HD.

## 2021-10-13 NOTE — ED Provider Notes (Signed)
[facility] EMERGENCY DEPARTMENT Provider Note   CSN: [id number] Arrival date & time: 10/13/21  0009     History  Chief Complaint  Patient presents with   Chest Pain    [name] is a 51 y.o. male.  HPI 51 year old male with extensive medical history and frequent ER visits (17 visits in the last 6 months) with a history of hyperlipidemia, hypertension, ESRD on HD TTHS, DM type II, prior hypertensive crises, homelessness, anxiety presents to the ER with complaints of chest pain and shortness of breath.  When asked to further clarify about the chest pain, the patient states he has breast pain rather than chest pain.  He states that he recently had a CT of the chest, which per chart review appears to have been done 4 days ago which did show bilateral gynecomastia.  He has not followed up with anyone for this.  He reports compliance with HD and was dialyzed on Thursday.  He states that he will sometimes use oxygen at night but has not had access to this in several weeks as he is homeless.  He is requesting social work consult.  He was seen here yesterday in the ER and had been told that there was no social work at night.  He states he called someone at the hospital today and was told that there was social work here at the hospital 24/7.  He also states that he has not taken his blood pressure medications or any of his other medicines because he was told that he needs to get them from a pharmacy and he does not have the money to buy them.  He denies any headache, nausea, vomiting, leg swelling, back pain, near syncope, fevers, chills.    Home Medications Prior to Admission medications   Medication Sig Start Date End Date Taking? Authorizing Provider  amLODipine (NORVASC) 10 MG tablet Take 1 tablet by mouth once a day 10/02/21     atorvastatin (LIPITOR) 40 MG tablet Take 1 tablet (40 mg total) by mouth daily. Patient not taking: Reported on 11/25/6281 6/62/94   Delora Fuel,  MD  Blood Glucose Monitoring Suppl (ACCU-CHEK GUIDE) w/Device KIT Use as directed 11/29/20   [name], MD  Blood Glucose Monitoring Suppl (TRUE METRIX METER) w/Device KIT Use to measure blood sugar twice a day 02/22/20   [name], MD  calcitRIOL (ROCALTROL) 0.25 MCG capsule Take 1 capsule (0.25 mcg total) by mouth daily. Patient not taking: Reported on 7/65/4650 3/54/65   Delora Fuel, MD  calcium gluconate 500 MG tablet Take 1 tablet (500 mg total) by mouth 3 (three) times daily. Patient taking differently: Take 1 tablet by mouth daily. 6/81/27   Delora Fuel, MD  carvedilol (COREG) 12.5 MG tablet Take 1 tablet by mouth twice a day with a meal 10/02/21     doxycycline (VIBRA-TABS) 100 MG tablet Take 1 tablet (100 mg total) by mouth 2 (two) times daily. 10/10/21   Marcello Fennel, PA-C  glucose blood test strip Use to check fasting blood sugar once daily. 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 Insulin dependent 11/29/20   [name], MD  hydrALAZINE (APRESOLINE) 25 MG tablet Take 1 tablet by mouth three times a day 10/02/21     insulin aspart (NOVOLOG FLEXPEN) 100 UNIT/ML FlexPen Inject 5 Units into the skin 2 (two) times daily with a meal. Patient not taking: Reported on 09/28/2021 02/26/21   [name], [name], MD  insulin glargine (LANTUS) 100 UNIT/ML  Solostar Pen Inject 10 Units into the skin daily. May substitute with any other long-acting formulary. Patient not taking: Reported on 09/28/2021 05/13/21   [name], MD  Insulin Pen Needle 32G X 4 MM MISC Use with insulin pens 02/26/21   [name], [name], MD  sertraline (ZOLOFT) 50 MG tablet Take 1 tablet (50 mg total) by mouth daily. Patient not taking: Reported on 07/06/2021 1/61/09 10/21/52  Delora Fuel, MD  TRUEplus Lancets 28G MISC USE TO MEASURE BLOOD SUGAR TWICE A DAY 11/29/20 11/29/21  [name], MD  gabapentin (NEURONTIN) 300 MG capsule Take 1 capsule (300 mg total) by mouth 3 (three) times daily. Patient not taking:  Reported on 07/27/2019 07/29/18 08/30/19  [name], FNP  insulin detemir (LEVEMIR FLEXTOUCH) 100 UNIT/ML FlexPen Inject 35 Units into the skin daily. Patient not taking: Reported on 02/17/2021 11/29/20 02/24/21  [name], MD  sildenafil (VIAGRA) 25 MG tablet Take 1 tablet (25 mg total) by mouth as needed for erectile dysfunction. for erectile dysfunction. Patient not taking: Reported on 08/11/2020 10/13/19 11/27/20  [name], FNP  sitaGLIPtin (JANUVIA) 25 MG tablet Take 1 tablet (25 mg total) by mouth daily. Patient not taking: Reported on 08/11/2020 02/22/20 11/27/20  [name], MD      Allergies    Sulfa antibiotics, Keflex [cephalexin], Other, and Bactrim [sulfamethoxazole-trimethoprim]    Review of Systems   Review of Systems Ten systems reviewed and are negative for acute change, except as noted in the HPI.   Physical Exam Updated Vital Signs BP (!) 173/93   Pulse 88   Temp 98.5 F (36.9 C) (Oral)   Resp 10   SpO2 100%  Physical Exam Vitals and nursing note reviewed.  Constitutional:      General: He is not in acute distress.    Appearance: He is well-developed.  HENT:     Head: Normocephalic and atraumatic.  Eyes:     Conjunctiva/sclera: Conjunctivae normal.  Cardiovascular:     Rate and Rhythm: Normal rate and regular rhythm.     Heart sounds: No murmur heard. Pulmonary:     Effort: Pulmonary effort is normal. No respiratory distress.     Breath sounds: Normal breath sounds.     Comments: Lung sounds diminished throughout, no rales, wheezes, crackles. Chest:     Comments: Evidence of bilateral gynecomastia.  Tenderness to palpation to the left nipple.  No overlying erythema, warmth, discharge, fluctuance Abdominal:     Palpations: Abdomen is soft.     Tenderness: There is no abdominal tenderness.  Musculoskeletal:        General: No swelling.     Cervical back: Neck supple.     Comments: No lower extremity edema  Skin:    General: Skin  is warm and dry.     Capillary Refill: Capillary refill takes less than 2 seconds.  Neurological:     General: No focal deficit present.     Mental Status: He is alert and oriented to person, place, and time.  Psychiatric:        Mood and Affect: Mood normal.    ED Results / Procedures / Treatments   Labs (all labs ordered are listed, but only abnormal results are displayed) Labs Reviewed  BASIC METABOLIC PANEL - Abnormal; Notable for the following components:      Result Value   Glucose, Bld 179 (*)    BUN 40 (*)    Creatinine, Ser 7.60 (*)  Calcium 7.8 (*)    GFR, Estimated 8 (*)    All other components within normal limits  CBC - Abnormal; Notable for the following components:   RBC 3.60 (*)    Hemoglobin 10.0 (*)    HCT 32.6 (*)    RDW 17.1 (*)    Platelets 413 (*)    nRBC 0.3 (*)    All other components within normal limits  BRAIN NATRIURETIC PEPTIDE - Abnormal; Notable for the following components:   B Natriuretic Peptide >4,500.0 (*)    All other components within normal limits  TROPONIN I (HIGH SENSITIVITY) - Abnormal; Notable for the following components:   Troponin I (High Sensitivity) 145 (*)    All other components within normal limits  TROPONIN I (HIGH SENSITIVITY) - Abnormal; Notable for the following components:   Troponin I (High Sensitivity) 138 (*)    All other components within normal limits    EKG None  Radiology DG Chest Portable 1 View  Result Date: 10/13/2021 CLINICAL DATA:  Shortness of breath, chest pain EXAM: PORTABLE CHEST 1 VIEW COMPARISON:  10/10/2018 FINDINGS: Cardiomegaly. Vascular congestion and diffuse bilateral airspace disease, likely edema. No effusions or acute bony abnormality. IMPRESSION: Cardiomegaly, moderate CHF. Electronically Signed   By: Rolm Baptise M.D.   On: 10/13/2021 03:26    Procedures Procedures    Medications Ordered in ED Medications  hydrALAZINE (APRESOLINE) injection 10 mg (10 mg Intravenous Not Given  10/13/21 0321)  carvedilol (COREG) tablet 12.5 mg (has no administration in time range)  furosemide (LASIX) injection 40 mg (has no administration in time range)  amLODipine (NORVASC) tablet 10 mg (10 mg Oral Given 10/13/21 0327)    ED Course/ Medical Decision Making/ A&P                           Medical Decision Making Amount and/or Complexity of Data Reviewed Labs: ordered. Radiology: ordered.  Risk Prescription drug management.   51 year old male who presents to the ER with complaints of chest/breast pain and requesting social work consult.  He was noted to be hypoxic in triage and he was placed on 2 L nasal cannula and sats improved to 93%.  He was hypertensive with a blood pressure of 202/108, however no headache, cardiac chest pain, blurry vision, or other signs of hypertensive emergency.  Patient reports that he does use oxygen as needed at home but does not have access to it as it is at his mother's home and he is homeless at this time. Labs ordered, reviewed and interpreted by me, BMP with no significant electrode abnormalities, creatinine and BUN consistent with ESRD.  CBC without leukocytosis, hemoglobin of 10 which appears to be at baseline.  I ordered, reviewed and interpreted chest x-ray, agree with radiology read.  Chest x-ray shows cardiomegaly with moderate CHF.  His BNP is greater than 4500 consistent with CHF.  Troponins elevated but flat and appear to be consistent to prior readings.  Patient refused EKG.  Patient had initially presented with complaints of chest pain however on further clarification this appears to be breast pain rather than cardiac pain.  He does have evidence of bilateral gynecomastia, and I reviewed his CT scan from 5/23 which confirms this.  My concern is that the patient is hypoxic on room air with a sat of 88% and does not have access to his oxygen.  Work-up also consistent with CHF exacerbation.  He was recently discharged from the  hospital 5/15 with a  similar presentation though did present with more respiratory distress than today.  I believe the patient would benefit from admission for dialysis, diuresis, BP management, and have a social work consult to help assist him with his home medications and housing situation.  I did contact Dr. Moshe Cipro and made her aware of patient's need for dialysis today.  Spoke with Dr. Bridgett Larsson who will admit the patient further evaluation and treatment.  Case discussed with Dr. Roxanne Mins who is agreeable to the above plan and disposition. Final Clinical Impression(s) / ED Diagnoses Final diagnoses:  Acute on chronic congestive heart failure, unspecified heart failure type [facility])  Gynecomastia    Rx / DC Orders ED Discharge Orders     None         Lyndel Safe 18/28[birth year]83 3744    Delora Fuel, MD 51/46/04 [phone]

## 2021-10-13 NOTE — Assessment & Plan Note (Addendum)
Chronic.  Social worker consulted for shelter information.

## 2021-10-13 NOTE — Progress Notes (Signed)
SW spoke with pt  re homelessness. Pt with history of homelessness but was most recently living with his mother. Pt reports his mom was recently evicted and living in a motel and there is not enough room for him. Pt agreed to SW contacting pt's mother. Spoke to pt's mother, Olivia Mackie [phone], who reports pt is not allowed to live with her because "he hangs with bums and runs around with guys from the soup kitchen."   Updated pt and he indicates he has no other place to live and was on the street prior to admission. Pt reports he is on the wait list at [facility]. SW spoke to [place] at [facility] who reports he is unable to confirm pt's wait list status and that they are unable to accept new intakes over the weekend. Pt reports he is aware of community resources for homelessness.   MD and RN updated.   Wandra Feinstein, MSW, LCSW [phone] (coverage)

## 2021-10-13 NOTE — Assessment & Plan Note (Addendum)
Not requiring oxygen this admission.

## 2021-10-13 NOTE — Progress Notes (Incomplete)
   Patient seen and examined at bedside, patient admitted after midnight, please see earlier detailed admission note by ***. Briefly, patient presented ***  Subjective: ***  BP 106/73   Pulse 80   Temp 97.9 F (36.6 C) (Oral)   Resp 16   Ht 5\' 7"  (1.702 m)   Wt 77.6 kg   SpO2 (!) 87% Comment: pt refused O2; notified MD.  BMI 26.79 kg/m   General exam: Appears calm and comfortable *** Respiratory system: Clear to auscultation. Respiratory effort normal. Cardiovascular system: S1 & S2 heard, RRR. No murmurs, rubs, gallops or clicks. Gastrointestinal system: Abdomen is nondistended, soft and nontender. No organomegaly or masses felt. Normal bowel sounds heard. Central nervous system: Alert and oriented. No focal neurological deficits. Musculoskeletal: No edema. No calf tenderness Skin: No cyanosis. No rashes Psychiatry: Judgement and insight appear normal. Mood & affect appropriate.   Brief assessment/Plan:  *** ***  Family communication: *** DVT prophylaxis: *** Disposition: ***  Cordelia Poche, MD [facility] 10/13/2021, 5:03 PM

## 2021-10-13 NOTE — Assessment & Plan Note (Addendum)
Nephrology consulted for hemodialysis. Patient underwent hemodialysis prior to discharge.

## 2021-10-13 NOTE — ED Notes (Signed)
Call placed to lab, adding BNP to previous collection

## 2021-10-13 NOTE — Assessment & Plan Note (Addendum)
Chronic Stable 

## 2021-10-13 NOTE — Subjective & Objective (Signed)
CC: chest pain, SOB HPI: 51 year old African-American male history of end-stage renal disease on dialysis Tuesday Thursday Saturday, type 2 diabetes, chronic hypoxic respiratory failure on 2 L of oxygen, hypertension, chronic homelessness presents to the ER with chest pain and shortness of breath.  He was evaluated on 10/12/2021.  He refused chest x-ray and other evaluation.  He was discharged from the ER.  He returned this morning for complaints of chest pain.  He is noted to be hypoxic yesterday.  Was on 2 L of oxygen.  ER note from yesterday reports that this is chronic for him that he requires home O2.  Patient refusing to speak to this Probation officer.  Labs showed a BNP greater than 4500  BUN of 40, creatinine 7.6  Chest x-ray shows diffuse pulmonary edema.  [facility] contacted for admission.

## 2021-10-13 NOTE — Assessment & Plan Note (Addendum)
Associated acute pulmonary edema. Nephrology consulted for hemodialysis. Asymptomatic on day of discharge.

## 2021-10-13 NOTE — Progress Notes (Signed)
[name] is a 51 Y/O male in hemodialysis T,Th,S at [facility]. PMH: HTN, DM2, HLD, HFrEF (EF 30-35% in July 2022) anemia of ESRD, SHPT, noncompliance with HD. Last HD 10/11/2021. He arrived 10 kgs above OP EDW, left 6.4 kg above OP EDW.   He presented to ED with C/O chest pain. Troponins low and flat, chemistry not impressive. CXR with vascular congestion and diffuse bilateral airspace disease, likely edema. [name]. We will manage HD today and consult formally if patient status upgraded to in-patient. Currently refusing treatments and to talk to staff. Admission wt 77.6 kg.  HD orders: [facility] T,Th,S 4 hours 180NRe 450/Autoflow 1.5 71 kg 2.0 K/3.0 Ca AVG -Heparin 2000 units IV TIW -Venofer 100 mg IV X 10 doses (4/10 doses given) -Mircera 75 mcg IV q 4 weeks (no recent doses. Last HGB 9.7 10/11/2021) -Calcitriol 0.75 mcg PO TIW  Juanell Fairly [facility] [phone]

## 2021-10-15 ENCOUNTER — Telehealth: Payer: Self-pay | Admitting: Nephrology

## 2021-10-15 NOTE — Telephone Encounter (Signed)
Transition of care contact from inpatient facility  Date of Discharge: 10/13/21 Date of Contact attempted  10/15/21 : Method of contact: Phone  Attempted to contact patient to discuss transition of care from inpatient admission. Patient did not answer the phone. Message was left on the patient's voicemail with call back number [phone].

## 2021-10-16 ENCOUNTER — Other Ambulatory Visit: Payer: Self-pay

## 2021-10-16 NOTE — Patient Instructions (Signed)
[name] ,   The [facility] Managed Care Team is available to provide assistance to you with your healthcare needs at no cost and as a benefit of your [facility] Health plan.   I'm sorry I was unable to reach you today for our scheduled appointment. Our care guide will call you to reschedule our telephone appointment. Please call me at the number below. I am available to be of assistance to you regarding your healthcare needs. .   Thank you,   Salvatore Marvel RN, BSN [facility] Coordinator [facility] Mobile: [phone]

## 2021-10-16 NOTE — Patient Outreach (Signed)
Care Coordination  10/16/2021  [name] 06/02/1970 [id number]  10/16/2021 Name: [name] MRN: [id number] DOB: October 25, 1970  Referred by: Vevelyn Francois, NP Reason for referral : High Risk Managed Medicaid (Unsuccessful Telephone Outreach)   An unsuccessful telephone outreach was attempted today. The patient was referred to the case management team for assistance with care management and care coordination.    Follow Up Plan: A HIPAA compliant phone message was left for the patient providing contact information and requesting a return call.  The Managed Medicaid care management team will reach out to the patient again over the next 7 - 14 days.    Salvatore Marvel RN, BSN Community Care Coordinator [facility] Mobile: [phone]

## 2021-10-17 ENCOUNTER — Other Ambulatory Visit: Payer: Self-pay

## 2021-10-18 ENCOUNTER — Telehealth: Payer: Self-pay | Admitting: Nurse Practitioner

## 2021-10-18 NOTE — Telephone Encounter (Signed)
..   Medicaid Managed Care   Unsuccessful Outreach Note  10/18/2021 Name: [name] MRN: [id number] DOB: [birth year]  Referred by: Vevelyn Francois, NP Reason for referral : High Risk Managed Medicaid (I called the patient today to get his initial visit with the MM RNCM rescheduled. I left a message on his VM.)   A second unsuccessful telephone outreach was attempted today. The patient was referred to the case management team for assistance with care management and care coordination.   Follow Up Plan: The care management team will reach out to the patient again over the next 7 days.    [place]

## 2021-10-23 ENCOUNTER — Other Ambulatory Visit: Payer: Self-pay

## 2021-11-01 ENCOUNTER — Encounter (HOSPITAL_COMMUNITY): Payer: Self-pay

## 2021-11-01 ENCOUNTER — Emergency Department (HOSPITAL_COMMUNITY)
Admission: EM | Admit: 2021-11-01 | Discharge: 2021-11-02 | Disposition: A | Discharge status: Home / Self Care | Payer: Commercial Managed Care - HMO | Attending: Emergency Medicine | Admitting: Emergency Medicine

## 2021-11-01 ENCOUNTER — Other Ambulatory Visit: Payer: Self-pay

## 2021-11-01 DIAGNOSIS — E1122 Type 2 diabetes mellitus with diabetic chronic kidney disease: Secondary | ICD-10-CM | POA: Insufficient documentation

## 2021-11-01 DIAGNOSIS — I129 Hypertensive chronic kidney disease with stage 1 through stage 4 chronic kidney disease, or unspecified chronic kidney disease: Secondary | ICD-10-CM | POA: Diagnosis not present

## 2021-11-01 DIAGNOSIS — Z7984 Long term (current) use of oral hypoglycemic drugs: Secondary | ICD-10-CM | POA: Insufficient documentation

## 2021-11-01 DIAGNOSIS — R21 Rash and other nonspecific skin eruption: Secondary | ICD-10-CM | POA: Diagnosis present

## 2021-11-01 DIAGNOSIS — L259 Unspecified contact dermatitis, unspecified cause: Secondary | ICD-10-CM | POA: Insufficient documentation

## 2021-11-01 DIAGNOSIS — Z992 Dependence on renal dialysis: Secondary | ICD-10-CM | POA: Insufficient documentation

## 2021-11-01 DIAGNOSIS — Z79899 Other long term (current) drug therapy: Secondary | ICD-10-CM | POA: Insufficient documentation

## 2021-11-01 DIAGNOSIS — Z794 Long term (current) use of insulin: Secondary | ICD-10-CM | POA: Insufficient documentation

## 2021-11-01 DIAGNOSIS — Z5900 Homelessness unspecified: Secondary | ICD-10-CM | POA: Diagnosis not present

## 2021-11-01 DIAGNOSIS — N189 Chronic kidney disease, unspecified: Secondary | ICD-10-CM | POA: Diagnosis not present

## 2021-11-01 MED ORDER — FAMOTIDINE 20 MG PO TABS
10.0000 mg | ORAL_TABLET | Freq: Once | ORAL | Status: AC
Start: 2021-11-01 — End: 2021-11-01
  Administered 2021-11-01: 10 mg via ORAL
  Filled 2021-11-01: qty 1

## 2021-11-01 MED ORDER — DIPHENHYDRAMINE HCL 25 MG PO CAPS
25.0000 mg | ORAL_CAPSULE | Freq: Once | ORAL | Status: AC
  Administered 2021-11-01: 25 mg via ORAL
  Filled 2021-11-01: qty 1

## 2021-11-01 NOTE — ED Triage Notes (Signed)
Ambulatory to ED with c/o rash on body for a "few weeks". Denies any symptoms other than itching.

## 2021-11-01 NOTE — ED Provider Notes (Signed)
[facility] DEPT Provider Note   CSN: [id number] Arrival date & time: 11/01/21  2249     History {Add pertinent medical, surgical, social history, OB history to HPI:1} Chief Complaint  Patient presents with   Rash    [name] is a 51 y.o. male.  Patient with history of hypertension, diabetes, ESRD on hemodialysis presents today with complaints of rash. He states that same has been present for several months now and is very itchy. He states that it is mostly on his shoulders and upper back. He denies any pain or drainage. No fevers or chills. Endorses compliance with hemodialysis.  Denies any recent exposures or worsening of his rash.  The history is provided by the patient. No language interpreter was used.  Rash      Home Medications Prior to Admission medications   Medication Sig Start Date End Date Taking? Authorizing Provider  amLODipine (NORVASC) 10 MG tablet Take 1 tablet (10 mg total) by mouth daily. 10/13/21   Mariel Aloe, MD  atorvastatin (LIPITOR) 40 MG tablet Take 1 tablet (40 mg total) by mouth daily. Patient not taking: Reported on 2/35/3614 4/31/54   Delora Fuel, MD  calcitRIOL (ROCALTROL) 0.25 MCG capsule Take 1 capsule (0.25 mcg total) by mouth daily. Patient not taking: Reported on 0/12/6759 9/50/93   Delora Fuel, MD  calcium gluconate 500 MG tablet Take 1 tablet (500 mg total) by mouth 3 (three) times daily. Patient not taking: Reported on 2/67/1245 12/26/96   Delora Fuel, MD  carvedilol (COREG) 12.5 MG tablet Take 1 tablet (12.5 mg total) by mouth 2 (two) times daily with a meal. 10/13/21   Mariel Aloe, MD  glucose blood test strip Use to check fasting blood sugar once daily. 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 Insulin dependent 11/29/20   [name], MD  hydrALAZINE (APRESOLINE) 25 MG tablet Take 1 tablet (25 mg total) by mouth 3 (three) times daily. 10/13/21 11/12/21  Mariel Aloe, MD  insulin aspart (NOVOLOG FLEXPEN) 100  UNIT/ML FlexPen Inject 5 Units into the skin 2 (two) times daily with a meal. Patient not taking: Reported on 09/28/2021 02/26/21   [name], [name], MD  insulin glargine (LANTUS) 100 UNIT/ML Solostar Pen Inject 10 Units into the skin daily. May substitute with any other long-acting formulary. Patient not taking: Reported on 09/28/2021 05/13/21   [name], MD  Insulin Pen Needle 32G X 4 MM MISC Use with insulin pens 02/26/21   [name], [name], MD  sertraline (ZOLOFT) 50 MG tablet Take 1 tablet (50 mg total) by mouth daily. Patient not taking: Reported on 07/06/2021 3/38/25 0/53/97  Delora Fuel, MD  TRUEplus Lancets 28G MISC USE TO MEASURE BLOOD SUGAR TWICE A DAY 11/29/20 11/29/21  [name], MD  gabapentin (NEURONTIN) 300 MG capsule Take 1 capsule (300 mg total) by mouth 3 (three) times daily. Patient not taking: Reported on 07/27/2019 07/29/18 08/30/19  [name], FNP  insulin detemir (LEVEMIR FLEXTOUCH) 100 UNIT/ML FlexPen Inject 35 Units into the skin daily. Patient not taking: Reported on 02/17/2021 11/29/20 02/24/21  [name], MD  sildenafil (VIAGRA) 25 MG tablet Take 1 tablet (25 mg total) by mouth as needed for erectile dysfunction. for erectile dysfunction. Patient not taking: Reported on 08/11/2020 10/13/19 11/27/20  [name], FNP  sitaGLIPtin (JANUVIA) 25 MG tablet Take 1 tablet (25 mg total) by mouth daily. Patient not taking: Reported on 08/11/2020 02/22/20 11/27/20  [name], MD  Allergies    Sulfa antibiotics, Keflex [cephalexin], Other, and Bactrim [sulfamethoxazole-trimethoprim]    Review of Systems   Review of Systems  Skin:  Positive for rash.  All other systems reviewed and are negative.   Physical Exam Updated Vital Signs BP (!) 183/106   Pulse 67   Temp 98.3 F (36.8 C)   Resp 20   SpO2 98%  Physical Exam Vitals and nursing note reviewed.  Constitutional:      General: He is not in acute distress.    Appearance:  Normal appearance. He is normal weight. He is not ill-appearing, toxic-appearing or diaphoretic.  HENT:     Head: Normocephalic and atraumatic.  Cardiovascular:     Rate and Rhythm: Normal rate.  Pulmonary:     Effort: Pulmonary effort is normal. No respiratory distress.  Musculoskeletal:        General: Normal range of motion.     Cervical back: Normal range of motion.  Skin:    General: Skin is warm and dry.     Comments: Patient with hyperpigmented papules present on the bilateral upper shoulders wrapping around to the anterior shoulders. Lichenification noted. No erythema or purulence. No drainage.  Neurological:     General: No focal deficit present.     Mental Status: He is alert.     Gait: Gait is intact.  Psychiatric:        Mood and Affect: Mood normal.        Behavior: Behavior normal.     ED Results / Procedures / Treatments   Labs (all labs ordered are listed, but only abnormal results are displayed) Labs Reviewed - No data to display  EKG None  Radiology No results found.  Procedures Procedures  {Document cardiac monitor, telemetry assessment procedure when appropriate:1}  Medications Ordered in ED Medications  famotidine (PEPCID) tablet 10 mg (10 mg Oral Given 11/01/21 2347)  diphenhydrAMINE (BENADRYL) capsule 25 mg (25 mg Oral Given 11/01/21 2347)    ED Course/ Medical Decision Making/ A&P                           Medical Decision Making  Rash consistent with ***. Patient denies any difficulty breathing or swallowing.  Pt has a patent airway without stridor and is handling secretions without difficulty; no angioedema. No blisters, no pustules, no warmth, no draining sinus tracts, no superficial abscesses, no bullous impetigo, no vesicles, no desquamation, no target lesions with dusky purpura or a central bulla. Not tender to touch. No concern for superimposed infection. No concern for SJS, TEN, TSS, tick borne illness, syphilis or other life-threatening  condition. Will discharge home with short course of steroids, pepcid and recommend Benadryl as needed for pruritis.   {Document critical care time when appropriate:1} {Document review of labs and clinical decision tools ie heart score, Chads2Vasc2 etc:1}  {Document your independent review of radiology images, and any outside records:1} {Document your discussion with family members, caretakers, and with consultants:1} {Document social determinants of health affecting pt's care:1} {Document your decision making why or why not admission, treatments were needed:1} Final Clinical Impression(s) / ED Diagnoses Final diagnoses:  None    Rx / DC Orders ED Discharge Orders     None

## 2021-11-02 DIAGNOSIS — L259 Unspecified contact dermatitis, unspecified cause: Secondary | ICD-10-CM | POA: Diagnosis not present

## 2021-11-02 MED ORDER — TRIAMCINOLONE ACETONIDE 0.025 % EX CREA
TOPICAL_CREAM | Freq: Once | CUTANEOUS | Status: AC
  Filled 2021-11-02: qty 15

## 2021-11-02 NOTE — ED Notes (Signed)
Went in to room to reinforce that pt has been discharged. Pt has blanket over his head and is resting on table. Advised pt that we need him to start moving out of the room so we can use it for other patients. Pt agreeable.

## 2021-11-02 NOTE — Discharge Instructions (Addendum)
As we discussed, your work-up in the ER today was reassuring for acute abnormalities.  I have given you several medications to help with your rash.  I highly recommend that you follow-up with your PCP for continued evaluation and management of your symptoms.  Return if development of any new or worsening symptoms.

## 2021-11-04 ENCOUNTER — Emergency Department (HOSPITAL_COMMUNITY)
Admission: EM | Admit: 2021-11-04 | Discharge: 2021-11-05 | Discharge status: Against Medical Advice | Payer: Commercial Managed Care - HMO | Source: Home / Self Care

## 2021-11-04 ENCOUNTER — Encounter (HOSPITAL_COMMUNITY): Payer: Self-pay

## 2021-11-04 DIAGNOSIS — Z5321 Procedure and treatment not carried out due to patient leaving prior to being seen by health care provider: Secondary | ICD-10-CM | POA: Insufficient documentation

## 2021-11-04 DIAGNOSIS — E8770 Fluid overload, unspecified: Secondary | ICD-10-CM | POA: Diagnosis not present

## 2021-11-04 DIAGNOSIS — R21 Rash and other nonspecific skin eruption: Secondary | ICD-10-CM | POA: Insufficient documentation

## 2021-11-04 DIAGNOSIS — R0602 Shortness of breath: Secondary | ICD-10-CM | POA: Insufficient documentation

## 2021-11-04 DIAGNOSIS — I509 Heart failure, unspecified: Secondary | ICD-10-CM | POA: Diagnosis not present

## 2021-11-04 DIAGNOSIS — Z992 Dependence on renal dialysis: Secondary | ICD-10-CM | POA: Insufficient documentation

## 2021-11-04 NOTE — ED Notes (Signed)
Unable to obtain bloodwork

## 2021-11-04 NOTE — ED Notes (Signed)
Pt called 3x no answer  

## 2021-11-04 NOTE — ED Triage Notes (Signed)
Pt reports that he has a rash all over his body that has been there for a while, pt reports that he has missed multiple dialysis appointments over the past two weeks, some SOB

## 2021-11-05 ENCOUNTER — Inpatient Hospital Stay (HOSPITAL_COMMUNITY)
Admission: EM | Admit: 2021-11-05 | Discharge: 2021-11-08 | DRG: 628 | Disposition: A | Discharge status: Home / Self Care | Payer: Commercial Managed Care - HMO | Attending: Family Medicine | Admitting: Family Medicine

## 2021-11-05 ENCOUNTER — Emergency Department (HOSPITAL_COMMUNITY): Payer: Commercial Managed Care - HMO

## 2021-11-05 ENCOUNTER — Other Ambulatory Visit: Payer: Self-pay

## 2021-11-05 DIAGNOSIS — Z79899 Other long term (current) drug therapy: Secondary | ICD-10-CM | POA: Diagnosis not present

## 2021-11-05 DIAGNOSIS — T82858A Stenosis of vascular prosthetic devices, implants and grafts, initial encounter: Secondary | ICD-10-CM | POA: Diagnosis present

## 2021-11-05 DIAGNOSIS — Z882 Allergy status to sulfonamides status: Secondary | ICD-10-CM

## 2021-11-05 DIAGNOSIS — Z881 Allergy status to other antibiotic agents status: Secondary | ICD-10-CM | POA: Diagnosis not present

## 2021-11-05 DIAGNOSIS — Z992 Dependence on renal dialysis: Secondary | ICD-10-CM | POA: Diagnosis not present

## 2021-11-05 DIAGNOSIS — M898X9 Other specified disorders of bone, unspecified site: Secondary | ICD-10-CM | POA: Diagnosis present

## 2021-11-05 DIAGNOSIS — R197 Diarrhea, unspecified: Secondary | ICD-10-CM | POA: Diagnosis not present

## 2021-11-05 DIAGNOSIS — Z5900 Homelessness unspecified: Secondary | ICD-10-CM | POA: Diagnosis not present

## 2021-11-05 DIAGNOSIS — E1140 Type 2 diabetes mellitus with diabetic neuropathy, unspecified: Secondary | ICD-10-CM | POA: Diagnosis present

## 2021-11-05 DIAGNOSIS — I161 Hypertensive emergency: Secondary | ICD-10-CM | POA: Diagnosis present

## 2021-11-05 DIAGNOSIS — E1122 Type 2 diabetes mellitus with diabetic chronic kidney disease: Secondary | ICD-10-CM | POA: Diagnosis present

## 2021-11-05 DIAGNOSIS — Z8249 Family history of ischemic heart disease and other diseases of the circulatory system: Secondary | ICD-10-CM

## 2021-11-05 DIAGNOSIS — I132 Hypertensive heart and chronic kidney disease with heart failure and with stage 5 chronic kidney disease, or end stage renal disease: Secondary | ICD-10-CM | POA: Diagnosis present

## 2021-11-05 DIAGNOSIS — J9601 Acute respiratory failure with hypoxia: Secondary | ICD-10-CM

## 2021-11-05 DIAGNOSIS — I5043 Acute on chronic combined systolic (congestive) and diastolic (congestive) heart failure: Secondary | ICD-10-CM

## 2021-11-05 DIAGNOSIS — I959 Hypotension, unspecified: Secondary | ICD-10-CM | POA: Diagnosis not present

## 2021-11-05 DIAGNOSIS — I10 Essential (primary) hypertension: Secondary | ICD-10-CM | POA: Diagnosis not present

## 2021-11-05 DIAGNOSIS — E8770 Fluid overload, unspecified: Principal | ICD-10-CM | POA: Diagnosis present

## 2021-11-05 DIAGNOSIS — N2581 Secondary hyperparathyroidism of renal origin: Secondary | ICD-10-CM | POA: Diagnosis present

## 2021-11-05 DIAGNOSIS — J690 Pneumonitis due to inhalation of food and vomit: Secondary | ICD-10-CM | POA: Diagnosis present

## 2021-11-05 DIAGNOSIS — Z888 Allergy status to other drugs, medicaments and biological substances status: Secondary | ICD-10-CM

## 2021-11-05 DIAGNOSIS — Z7984 Long term (current) use of oral hypoglycemic drugs: Secondary | ICD-10-CM | POA: Diagnosis not present

## 2021-11-05 DIAGNOSIS — I169 Hypertensive crisis, unspecified: Secondary | ICD-10-CM | POA: Diagnosis present

## 2021-11-05 DIAGNOSIS — R21 Rash and other nonspecific skin eruption: Secondary | ICD-10-CM | POA: Diagnosis present

## 2021-11-05 DIAGNOSIS — N186 End stage renal disease: Secondary | ICD-10-CM | POA: Diagnosis present

## 2021-11-05 DIAGNOSIS — Z5901 Sheltered homelessness: Secondary | ICD-10-CM | POA: Diagnosis not present

## 2021-11-05 DIAGNOSIS — J189 Pneumonia, unspecified organism: Secondary | ICD-10-CM | POA: Diagnosis present

## 2021-11-05 DIAGNOSIS — R112 Nausea with vomiting, unspecified: Secondary | ICD-10-CM

## 2021-11-05 DIAGNOSIS — E785 Hyperlipidemia, unspecified: Secondary | ICD-10-CM | POA: Diagnosis present

## 2021-11-05 DIAGNOSIS — D631 Anemia in chronic kidney disease: Secondary | ICD-10-CM | POA: Diagnosis present

## 2021-11-05 DIAGNOSIS — Z794 Long term (current) use of insulin: Secondary | ICD-10-CM

## 2021-11-05 DIAGNOSIS — I152 Hypertension secondary to endocrine disorders: Secondary | ICD-10-CM | POA: Diagnosis present

## 2021-11-05 DIAGNOSIS — E119 Type 2 diabetes mellitus without complications: Secondary | ICD-10-CM

## 2021-11-05 DIAGNOSIS — Y832 Surgical operation with anastomosis, bypass or graft as the cause of abnormal reaction of the patient, or of later complication, without mention of misadventure at the time of the procedure: Secondary | ICD-10-CM | POA: Diagnosis present

## 2021-11-05 DIAGNOSIS — E1129 Type 2 diabetes mellitus with other diabetic kidney complication: Principal | ICD-10-CM

## 2021-11-05 DIAGNOSIS — E1159 Type 2 diabetes mellitus with other circulatory complications: Secondary | ICD-10-CM | POA: Diagnosis present

## 2021-11-05 DIAGNOSIS — I447 Left bundle-branch block, unspecified: Secondary | ICD-10-CM | POA: Diagnosis present

## 2021-11-05 DIAGNOSIS — J810 Acute pulmonary edema: Secondary | ICD-10-CM | POA: Diagnosis present

## 2021-11-05 DIAGNOSIS — N189 Chronic kidney disease, unspecified: Secondary | ICD-10-CM | POA: Diagnosis present

## 2021-11-05 DIAGNOSIS — Z91158 Patient's noncompliance with renal dialysis for other reason: Secondary | ICD-10-CM

## 2021-11-05 DIAGNOSIS — I509 Heart failure, unspecified: Secondary | ICD-10-CM | POA: Diagnosis present

## 2021-11-05 DIAGNOSIS — Z91148 Patient's other noncompliance with medication regimen for other reason: Secondary | ICD-10-CM

## 2021-11-05 DIAGNOSIS — E1165 Type 2 diabetes mellitus with hyperglycemia: Secondary | ICD-10-CM | POA: Diagnosis present

## 2021-11-05 LAB — CBC WITH DIFFERENTIAL/PLATELET
Abs Immature Granulocytes: 0.07 10*3/uL (ref 0.00–0.07)
Basophils Absolute: 0 10*3/uL (ref 0.0–0.1)
Basophils Relative: 0 %
Eosinophils Absolute: 0 10*3/uL (ref 0.0–0.5)
Eosinophils Relative: 0 %
HCT: 37.1 % — ABNORMAL LOW (ref 39.0–52.0)
Hemoglobin: 11.9 g/dL — ABNORMAL LOW (ref 13.0–17.0)
Immature Granulocytes: 1 %
Lymphocytes Relative: 5 %
Lymphs Abs: 0.7 10*3/uL (ref 0.7–4.0)
MCH: 29 pg (ref 26.0–34.0)
MCHC: 32.1 g/dL (ref 30.0–36.0)
MCV: 90.3 fL (ref 80.0–100.0)
Monocytes Absolute: 0.9 10*3/uL (ref 0.1–1.0)
Monocytes Relative: 7 %
Neutro Abs: 11.9 10*3/uL — ABNORMAL HIGH (ref 1.7–7.7)
Neutrophils Relative %: 87 %
Platelets: 404 10*3/uL — ABNORMAL HIGH (ref 150–400)
RBC: 4.11 MIL/uL — ABNORMAL LOW (ref 4.22–5.81)
RDW: 17.1 % — ABNORMAL HIGH (ref 11.5–15.5)
WBC: 13.7 10*3/uL — ABNORMAL HIGH (ref 4.0–10.5)
nRBC: 0.1 % (ref 0.0–0.2)

## 2021-11-05 LAB — COMPREHENSIVE METABOLIC PANEL
ALT: 60 U/L — ABNORMAL HIGH (ref 0–44)
AST: 44 U/L — ABNORMAL HIGH (ref 15–41)
Albumin: 2.5 g/dL — ABNORMAL LOW (ref 3.5–5.0)
Alkaline Phosphatase: 439 U/L — ABNORMAL HIGH (ref 38–126)
Anion gap: 19 — ABNORMAL HIGH (ref 5–15)
BUN: 75 mg/dL — ABNORMAL HIGH (ref 6–20)
CO2: 21 mmol/L — ABNORMAL LOW (ref 22–32)
Calcium: 7.7 mg/dL — ABNORMAL LOW (ref 8.9–10.3)
Chloride: 102 mmol/L (ref 98–111)
Creatinine, Ser: 11.39 mg/dL — ABNORMAL HIGH (ref 0.61–1.24)
GFR, Estimated: 5 mL/min — ABNORMAL LOW (ref >60)
Glucose, Bld: 225 mg/dL — ABNORMAL HIGH (ref 70–99)
Potassium: 4.5 mmol/L (ref 3.5–5.1)
Sodium: 142 mmol/L (ref 135–145)
Total Bilirubin: 1 mg/dL (ref 0.3–1.2)
Total Protein: 7.5 g/dL (ref 6.5–8.1)

## 2021-11-05 LAB — I-STAT ARTERIAL BLOOD GAS, ED
Acid-base deficit: 2 mmol/L (ref 0.0–2.0)
Bicarbonate: 23.4 mmol/L (ref 20.0–28.0)
Calcium, Ion: 0.96 mmol/L — ABNORMAL LOW (ref 1.15–1.40)
HCT: 33 % — ABNORMAL LOW (ref 39.0–52.0)
Hemoglobin: 11.2 g/dL — ABNORMAL LOW (ref 13.0–17.0)
O2 Saturation: 91 %
Patient temperature: 98.9
Potassium: 4.1 mmol/L (ref 3.5–5.1)
Sodium: 138 mmol/L (ref 135–145)
TCO2: 25 mmol/L (ref 22–32)
pCO2 arterial: 43 mmHg (ref 32–48)
pH, Arterial: 7.345 — ABNORMAL LOW (ref 7.35–7.45)
pO2, Arterial: 66 mmHg — ABNORMAL LOW (ref 83–108)

## 2021-11-05 LAB — HEPATITIS B SURFACE ANTIBODY,QUALITATIVE: Hep B S Ab: NONREACTIVE

## 2021-11-05 LAB — LIPASE, BLOOD: Lipase: 34 U/L (ref 11–51)

## 2021-11-05 LAB — HEPATITIS C ANTIBODY: HCV Ab: NONREACTIVE

## 2021-11-05 LAB — GLUCOSE, CAPILLARY: Glucose-Capillary: 131 mg/dL — ABNORMAL HIGH (ref 70–99)

## 2021-11-05 LAB — HEPATITIS B SURFACE ANTIGEN: Hepatitis B Surface Ag: NONREACTIVE

## 2021-11-05 LAB — LACTIC ACID, PLASMA: Lactic Acid, Venous: 2.3 mmol/L (ref 0.5–1.9)

## 2021-11-05 LAB — HEPATITIS B CORE ANTIBODY, TOTAL: Hep B Core Total Ab: NONREACTIVE

## 2021-11-05 MED ORDER — CHLORHEXIDINE GLUCONATE CLOTH 2 % EX PADS
6.0000 | MEDICATED_PAD | Freq: Every day | CUTANEOUS | Status: DC
  Administered 2021-11-06: 6 via TOPICAL

## 2021-11-05 MED ORDER — NITROGLYCERIN IN D5W 200-5 MCG/ML-% IV SOLN
0.0000 ug/min | INTRAVENOUS | Status: DC
  Administered 2021-11-05: 10 ug/min via INTRAVENOUS
  Filled 2021-11-05: qty 250

## 2021-11-05 MED ORDER — FUROSEMIDE 10 MG/ML IJ SOLN: 80.0000 mg | Freq: Once | INTRAMUSCULAR | Status: DC

## 2021-11-05 MED ORDER — LIDOCAINE HCL (PF) 1 % IJ SOLN: 5.0000 mL | INTRAMUSCULAR | Status: DC | PRN

## 2021-11-05 MED ORDER — SODIUM CHLORIDE 0.9 % IV SOLN
3.0000 g | Freq: Two times a day (BID) | INTRAVENOUS | Status: DC
  Administered 2021-11-06 (×2): 3 g via INTRAVENOUS
  Filled 2021-11-05 (×6): qty 8

## 2021-11-05 MED ORDER — HEPARIN SODIUM (PORCINE) 5000 UNIT/ML IJ SOLN
5000.0000 [IU] | Freq: Two times a day (BID) | INTRAMUSCULAR | Status: DC
  Administered 2021-11-06: 5000 [IU] via SUBCUTANEOUS
  Filled 2021-11-05 (×4): qty 1

## 2021-11-05 MED ORDER — HEPARIN SODIUM (PORCINE) 1000 UNIT/ML DIALYSIS
2000.0000 [IU] | INTRAMUSCULAR | Status: DC | PRN
  Administered 2021-11-07: 2000 [IU] via INTRAVENOUS_CENTRAL
  Filled 2021-11-05 (×3): qty 2

## 2021-11-05 MED ORDER — SODIUM CHLORIDE 0.9% FLUSH
3.0000 mL | INTRAVENOUS | Status: DC | PRN
Start: 2021-11-05 — End: 2021-11-08

## 2021-11-05 MED ORDER — SEVELAMER CARBONATE 800 MG PO TABS
1600.0000 mg | ORAL_TABLET | Freq: Three times a day (TID) | ORAL | Status: DC
  Administered 2021-11-06: 1600 mg via ORAL
  Filled 2021-11-05 (×3): qty 2

## 2021-11-05 MED ORDER — INSULIN ASPART 100 UNIT/ML IJ SOLN
5.0000 [IU] | Freq: Two times a day (BID) | INTRAMUSCULAR | Status: DC
Start: 2021-11-05 — End: 2021-11-08
  Administered 2021-11-06 – 2021-11-07 (×3): 5 [IU] via SUBCUTANEOUS

## 2021-11-05 MED ORDER — HYDRALAZINE HCL 25 MG PO TABS
25.0000 mg | ORAL_TABLET | Freq: Three times a day (TID) | ORAL | Status: DC
  Administered 2021-11-06: 25 mg via ORAL
  Filled 2021-11-05 (×2): qty 1

## 2021-11-05 MED ORDER — ACETAMINOPHEN 325 MG PO TABS: 650.0000 mg | ORAL_TABLET | ORAL | Status: DC | PRN

## 2021-11-05 MED ORDER — ONDANSETRON HCL 4 MG/2ML IJ SOLN
4.0000 mg | Freq: Once | INTRAMUSCULAR | Status: AC
  Administered 2021-11-05: 4 mg via INTRAVENOUS
  Filled 2021-11-05: qty 2

## 2021-11-05 MED ORDER — SODIUM CHLORIDE 0.9% FLUSH
3.0000 mL | Freq: Two times a day (BID) | INTRAVENOUS | Status: DC
  Administered 2021-11-05 – 2021-11-07 (×4): 3 mL via INTRAVENOUS

## 2021-11-05 MED ORDER — ONDANSETRON HCL 4 MG/2ML IJ SOLN: 4.0000 mg | Freq: Four times a day (QID) | INTRAMUSCULAR | Status: DC | PRN

## 2021-11-05 MED ORDER — GUAIFENESIN ER 600 MG PO TB12
1200.0000 mg | ORAL_TABLET | Freq: Two times a day (BID) | ORAL | Status: DC
  Administered 2021-11-05 – 2021-11-07 (×2): 1200 mg via ORAL
  Filled 2021-11-05 (×5): qty 2

## 2021-11-05 MED ORDER — SEVELAMER CARBONATE 800 MG PO TABS
800.0000 mg | ORAL_TABLET | ORAL | Status: DC
  Filled 2021-11-05: qty 1

## 2021-11-05 MED ORDER — CARVEDILOL 12.5 MG PO TABS
12.5000 mg | ORAL_TABLET | Freq: Two times a day (BID) | ORAL | Status: DC
  Administered 2021-11-06 (×2): 12.5 mg via ORAL
  Filled 2021-11-05 (×3): qty 1

## 2021-11-05 MED ORDER — HEPARIN SODIUM (PORCINE) 1000 UNIT/ML IJ SOLN
2000.0000 [IU] | Freq: Once | INTRAMUSCULAR | Status: DC
Start: 2021-11-05 — End: 2021-11-05
  Filled 2021-11-05: qty 2

## 2021-11-05 MED ORDER — CALCIUM CARBONATE ANTACID 500 MG PO CHEW
1.0000 | CHEWABLE_TABLET | Freq: Three times a day (TID) | ORAL | Status: DC
  Administered 2021-11-05 – 2021-11-08 (×6): 200 mg via ORAL
  Filled 2021-11-05 (×7): qty 1

## 2021-11-05 MED ORDER — VANCOMYCIN HCL 1500 MG/300ML IV SOLN
1500.0000 mg | Freq: Once | INTRAVENOUS | Status: DC
  Filled 2021-11-05: qty 300

## 2021-11-05 MED ORDER — INSULIN ASPART 100 UNIT/ML IJ SOLN
0.0000 [IU] | Freq: Three times a day (TID) | INTRAMUSCULAR | Status: DC
  Administered 2021-11-06 – 2021-11-07 (×4): 1 [IU] via SUBCUTANEOUS

## 2021-11-05 MED ORDER — SODIUM CHLORIDE 0.9 % IV SOLN: 1.0000 g | INTRAVENOUS | Status: DC

## 2021-11-05 MED ORDER — PENTAFLUOROPROP-TETRAFLUOROETH EX AERO: 1.0000 | INHALATION_SPRAY | CUTANEOUS | Status: DC | PRN

## 2021-11-05 MED ORDER — LIDOCAINE-PRILOCAINE 2.5-2.5 % EX CREA: 1.0000 | TOPICAL_CREAM | CUTANEOUS | Status: DC | PRN

## 2021-11-05 MED ORDER — VANCOMYCIN HCL 750 MG/150ML IV SOLN: 750.0000 mg | INTRAVENOUS | Status: DC

## 2021-11-05 MED ORDER — CALCITRIOL 0.5 MCG PO CAPS
0.7500 ug | ORAL_CAPSULE | ORAL | Status: DC
  Administered 2021-11-06: 0.75 ug via ORAL
  Filled 2021-11-05: qty 1

## 2021-11-05 MED ORDER — AMLODIPINE BESYLATE 10 MG PO TABS: 10.0000 mg | ORAL_TABLET | Freq: Every day | ORAL | Status: DC

## 2021-11-05 MED ORDER — SODIUM CHLORIDE 0.9 % IV SOLN: 2.0000 g | Freq: Once | INTRAVENOUS | Status: DC

## 2021-11-05 MED ORDER — HEPARIN SODIUM (PORCINE) 1000 UNIT/ML IJ SOLN
INTRAMUSCULAR | Status: AC
  Administered 2021-11-05: 2000 [IU]
  Filled 2021-11-05: qty 2

## 2021-11-05 MED ORDER — HYDRALAZINE HCL 20 MG/ML IJ SOLN
5.0000 mg | Freq: Four times a day (QID) | INTRAMUSCULAR | Status: DC | PRN
  Administered 2021-11-05: 5 mg via INTRAVENOUS
  Filled 2021-11-05 (×2): qty 1

## 2021-11-05 MED ORDER — SEVELAMER CARBONATE 800 MG PO TABS: 800.0000 mg | ORAL_TABLET | ORAL | Status: DC

## 2021-11-05 MED ORDER — HEPARIN SODIUM (PORCINE) 1000 UNIT/ML DIALYSIS: 2000.0000 [IU] | Freq: Once | INTRAMUSCULAR | Status: DC

## 2021-11-05 MED ORDER — SODIUM CHLORIDE 0.9 % IV SOLN
250.0000 mL | INTRAVENOUS | Status: DC | PRN
Start: 2021-11-05 — End: 2021-11-08

## 2021-11-05 NOTE — Progress Notes (Addendum)
Pharmacy Antibiotic Note  [name] is a 51 y.o. male admitted on 11/05/2021 presenting with SOB.  Pharmacy has been consulted for vancomycin and cefepime dosing.  ESRD-HD usually TTS, missed Saturday  Plan: Vancomycin 1500 mg IV x 1, then 750 mg IV qHD Cefepime 2g IV x 1, then 1g IV q 24h Add MRSA PCR Monitor iHD schedule, Cx/PCR to narrow Vancomycin random level as needed  Addendum: Now to narrow to Unasyn     Temp (24hrs), Avg:98.7 F (37.1 C), Min:98.4 F (36.9 C), Max:98.9 F (37.2 C)  Recent Labs  Lab 11/05/21 1208  WBC 13.7*  CREATININE 11.39*  LATICACIDVEN 2.3*    CrCl cannot be calculated (Unknown ideal weight.).    Allergies  Allergen Reactions   Sulfa Antibiotics Rash    Severe rash.    Keflex [Cephalexin] Other (See Comments)    Patient with severe drug reaction including exfoliating skin rash and hypotension after being prescribed both cephalexin and sulfamethoxazole-trimethoprim simultaneously. Favor SMX as much more likely culprit as patient had tolerated other cephalosporins in the past, but cannot say with complete certainty that this was not related to cephalexin.    Other Other (See Comments)    Patient states he is allergic to the TB test - unknown reaction   Bactrim [Sulfamethoxazole-Trimethoprim] Rash    Bertis Ruddy, PharmD Clinical Pharmacist ED Pharmacist Phone # [phone] 11/05/2021 2:12 PM

## 2021-11-05 NOTE — H&P (Signed)
History and Physical    [name] IWP:[id number] DOB: 12/09/1970 DOA: 11/05/2021  PCP: Vevelyn Francois, NP (Confirm with patient/family/NH records and if not entered, this has to be entered at [facility] point of entry) Patient coming from: Home  I have personally briefly reviewed patient's old medical records in [place]  Chief Complaint: Shortness of breath, nauseous vomiting and diarrhea  HPI: [name] is a 51 y.o. male with medical history significant of chronic combined systolic and diastolic CHF, ESRD on HD, HTN, HLD, IDDM, diabetic neuropathy, anxiety/depression, presented with increasing shortness of breath and cough, and new onset of nauseous vomiting diarrhea.  Patient has not been compliant with his BP and diabetic medications, he has run out of BP medications and insulin " few weeks ago".  And increasingly he developed exertional dyspnea and a dry cough, denies any fever chills no chest pains.  Yesterday evening, he started to have nauseous and frequent vomiting 4-5 times overnight and loose bowel movement.  Denies any abdominal pain no fever or chills.  He missed dialysis on Saturday due to shortness of breath.  ED Course: Patient was found to be hypoxic ulceration in the 80s, blood pressure significant elevated.  Chest x-ray and CT abdomen showed bilateral lung infiltrates pulmonary edema versus multifocal pneumonia, otherwise no acute abdominal findings.  BBC 13, creatinine 11, K4.5, bicarb 21.  Patient was given vancomycin and cefepime in the ED.  Review of Systems: As per HPI otherwise 14 point review of systems negative.    Past Medical History:  Diagnosis Date   Anxiety 02/2019   Anxiety 10/2019   Diabetes mellitus type II, uncontrolled 07/17/2006   Elevated alkaline phosphatase level 01/2019   ESSENTIAL HYPERTENSION 07/17/2006   HYPERLIPIDEMIA 11/24/2008   Insomnia 02/2019   Left bundle branch block 02/22/2019   Lesion of lip 10/2019   Pneumonia  10/22/2011   Suicidal ideations 02/2019   Vitamin D deficiency 01/2019    Past Surgical History:  Procedure Laterality Date   ARM SURGERY     AV FISTULA PLACEMENT Right 02/20/2021   Procedure: RIGHT ARTERIOVENOUS GRAFT CREATION;  Surgeon: Angelia Mould, MD;  Location: [facility];  Service: Vascular;  Laterality: Right;   INSERTION OF DIALYSIS CATHETER Left 02/20/2021   Procedure: INSERTION OF DIALYSIS CATHETER USING PALINDROME CATHETER;  Surgeon: Angelia Mould, MD;  Location: [facility];  Service: Vascular;  Laterality: Left;   IR REMOVAL TUN CV CATH W/O FL  07/27/2021   ULTRASOUND GUIDANCE FOR VASCULAR ACCESS  02/20/2021   Procedure: ULTRASOUND GUIDANCE FOR VASCULAR ACCESS;  Surgeon: Angelia Mould, MD;  Location: [facility];  Service: Vascular;;     reports that he has never smoked. He has never used smokeless tobacco. He reports that he does not drink alcohol and does not use drugs.  Allergies  Allergen Reactions   Sulfa Antibiotics Rash    Severe rash.    Keflex [Cephalexin] Other (See Comments)    Patient with severe drug reaction including exfoliating skin rash and hypotension after being prescribed both cephalexin and sulfamethoxazole-trimethoprim simultaneously. Favor SMX as much more likely culprit as patient had tolerated other cephalosporins in the past, but cannot say with complete certainty that this was not related to cephalexin.    Other Other (See Comments)    Patient states he is allergic to the TB test - unknown reaction   Bactrim [Sulfamethoxazole-Trimethoprim] Rash    Family History  Problem Relation Age of Onset   Hypertension  Mother 41   Cancer Mother        bladder cancer   Alcohol abuse Father    Hypertension Brother      Prior to Admission medications   Medication Sig Start Date End Date Taking? Authorizing Provider  amLODipine (NORVASC) 10 MG tablet Take 1 tablet (10 mg total) by mouth daily. 10/13/21   Mariel Aloe, MD  atorvastatin  (LIPITOR) 40 MG tablet Take 1 tablet (40 mg total) by mouth daily. Patient not taking: Reported on 2/70/3500 9/38/18   Delora Fuel, MD  calcitRIOL (ROCALTROL) 0.25 MCG capsule Take 1 capsule (0.25 mcg total) by mouth daily. Patient not taking: Reported on 2/99/3716 9/67/89   Delora Fuel, MD  calcium gluconate 500 MG tablet Take 1 tablet (500 mg total) by mouth 3 (three) times daily. Patient not taking: Reported on 3/81/0175 05/21/56   Delora Fuel, MD  carvedilol (COREG) 12.5 MG tablet Take 1 tablet (12.5 mg total) by mouth 2 (two) times daily with a meal. 10/13/21   Mariel Aloe, MD  glucose blood test strip Use to check fasting blood sugar once daily. 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 Insulin dependent 11/29/20   [name], MD  hydrALAZINE (APRESOLINE) 25 MG tablet Take 1 tablet (25 mg total) by mouth 3 (three) times daily. 10/13/21 11/12/21  Mariel Aloe, MD  insulin aspart (NOVOLOG FLEXPEN) 100 UNIT/ML FlexPen Inject 5 Units into the skin 2 (two) times daily with a meal. Patient not taking: Reported on 09/28/2021 02/26/21   [name], [name], MD  insulin glargine (LANTUS) 100 UNIT/ML Solostar Pen Inject 10 Units into the skin daily. May substitute with any other long-acting formulary. Patient not taking: Reported on 09/28/2021 05/13/21   [name], MD  Insulin Pen Needle 32G X 4 MM MISC Use with insulin pens 02/26/21   [name], [name], MD  RENVELA 800 MG tablet Take 800-1,600 mg by mouth See admin instructions. Take 2 tablets (1600 mg) three times a day with meals and 1 tablet (800 mg) twice a day as needed with snacks 10/25/21   [provider]  sertraline (ZOLOFT) 50 MG tablet Take 1 tablet (50 mg total) by mouth daily. Patient not taking: Reported on 07/06/2021 10/13/76 2/42/35  Delora Fuel, MD  TRUEplus Lancets 28G MISC USE TO MEASURE BLOOD SUGAR TWICE A DAY 11/29/20 11/29/21  [name], MD  gabapentin (NEURONTIN) 300 MG capsule Take 1 capsule (300 mg total) by mouth 3  (three) times daily. Patient not taking: Reported on 07/27/2019 07/29/18 08/30/19  [name], FNP  insulin detemir (LEVEMIR FLEXTOUCH) 100 UNIT/ML FlexPen Inject 35 Units into the skin daily. Patient not taking: Reported on 02/17/2021 11/29/20 02/24/21  [name], MD  sildenafil (VIAGRA) 25 MG tablet Take 1 tablet (25 mg total) by mouth as needed for erectile dysfunction. for erectile dysfunction. Patient not taking: Reported on 08/11/2020 10/13/19 11/27/20  [name], FNP  sitaGLIPtin (JANUVIA) 25 MG tablet Take 1 tablet (25 mg total) by mouth daily. Patient not taking: Reported on 08/11/2020 02/22/20 11/27/20  [name], MD    Physical Exam: Vitals:   11/05/21 1251 11/05/21 1345 11/05/21 1415 11/05/21 1515  BP:  (!) 167/87 (!) 166/99 (!) 164/102  Pulse: 96 95 92 88  Resp: (!) 26 (!) 26 13 12   Temp:      SpO2: 96% 96% 98% 93%    Constitutional: NAD, calm, comfortable Vitals:   11/05/21 1251 11/05/21 1345 11/05/21 1415 11/05/21 1515  BP:  (!) 167/87 (!) 166/99 (!) 164/102  Pulse: 96 95 92 88  Resp: (!) 26 (!) 26 13 12   Temp:      SpO2: 96% 96% 98% 93%   Eyes: PERRL, lids and conjunctivae normal ENMT: Mucous membranes are moist. Posterior pharynx clear of any exudate or lesions.Normal dentition.  Neck: normal, supple, no masses, no thyromegaly Respiratory: clear to auscultation bilaterally, no wheezing, bilateral coarse crackles concentrated on lower fields bilaterally, increasing breathing effort, talking in broken sentences. No accessory muscle use.  Cardiovascular: Regular rate and rhythm, no murmurs / rubs / gallops.  2+ extremity edema. 2+ pedal pulses. No carotid bruits.  Abdomen: no tenderness, no masses palpated. No hepatosplenomegaly. Bowel sounds positive.  Musculoskeletal: no clubbing / cyanosis. No joint deformity upper and lower extremities. Good ROM, no contractures. Normal muscle tone.  Skin: no rashes, lesions, ulcers. No induration Neurologic:  CN 2-12 grossly intact. Sensation intact, DTR normal. Strength 5/5 in all 4.  Psychiatric: Normal judgment and insight. Alert and oriented x 3. Normal mood.     Labs on Admission: I have personally reviewed following labs and imaging studies  CBC: Recent Labs  Lab 11/05/21 1208 11/05/21 1227  WBC 13.7*  --   NEUTROABS 11.9*  --   HGB 11.9* 11.2*  HCT 37.1* 33.0*  MCV 90.3  --   PLT 404*  --    Basic Metabolic Panel: Recent Labs  Lab 11/05/21 1208 11/05/21 1227  NA 142 138  K 4.5 4.1  CL 102  --   CO2 21*  --   GLUCOSE 225*  --   BUN 75*  --   CREATININE 11.39*  --   CALCIUM 7.7*  --    GFR: CrCl cannot be calculated (Unknown ideal weight.). Liver Function Tests: Recent Labs  Lab 11/05/21 1208  AST 44*  ALT 60*  ALKPHOS 439*  BILITOT 1.0  PROT 7.5  ALBUMIN 2.5*   Recent Labs  Lab 11/05/21 1208  LIPASE 34   No results for input(s): "AMMONIA" in the last 168 hours. Coagulation Profile: No results for input(s): "INR", "PROTIME" in the last 168 hours. Cardiac Enzymes: No results for input(s): "CKTOTAL", "CKMB", "CKMBINDEX", "TROPONINI" in the last 168 hours. BNP (last 3 results) No results for input(s): "PROBNP" in the last 8760 hours. HbA1C: No results for input(s): "HGBA1C" in the last 72 hours. CBG: No results for input(s): "GLUCAP" in the last 168 hours. Lipid Profile: No results for input(s): "CHOL", "HDL", "LDLCALC", "TRIG", "CHOLHDL", "LDLDIRECT" in the last 72 hours. Thyroid Function Tests: No results for input(s): "TSH", "T4TOTAL", "FREET4", "T3FREE", "THYROIDAB" in the last 72 hours. Anemia Panel: No results for input(s): "VITAMINB12", "FOLATE", "FERRITIN", "TIBC", "IRON", "RETICCTPCT" in the last 72 hours. Urine analysis:    Component Value Date/Time   COLORURINE YELLOW 02/19/2021 0045   APPEARANCEUR HAZY (A) 02/19/2021 0045   APPEARANCEUR Clear 05/09/2020 1509   LABSPEC 1.013 02/19/2021 0045   PHURINE 5.0 02/19/2021 0045   GLUCOSEU  >=500 (A) 02/19/2021 0045   HGBUR MODERATE (A) 02/19/2021 0045   BILIRUBINUR NEGATIVE 02/19/2021 0045   BILIRUBINUR Negative 05/09/2020 1509   KETONESUR NEGATIVE 02/19/2021 0045   PROTEINUR >=300 (A) 02/19/2021 0045   UROBILINOGEN 0.2 02/04/2020 1612   UROBILINOGEN 0.2 02/16/2014 0045   NITRITE NEGATIVE 02/19/2021 0045   LEUKOCYTESUR NEGATIVE 02/19/2021 0045    Radiological Exams on Admission: CT ABDOMEN PELVIS WO CONTRAST  Result Date: 11/05/2021 CLINICAL DATA:  Nausea, vomiting, history hypertension, type II diabetes mellitus  EXAM: CT ABDOMEN AND PELVIS WITHOUT CONTRAST TECHNIQUE: Multidetector CT imaging of the abdomen and pelvis was performed following the standard protocol without IV contrast. RADIATION DOSE REDUCTION: This exam was performed according to the departmental dose-optimization program which includes automated exposure control, adjustment of the mA and/or kV according to patient size and/or use of iterative reconstruction technique. COMPARISON:  10/21/2011 FINDINGS: Lower chest: Bibasilar pulmonary infiltrates consistent with multifocal pneumonia. Small BILATERAL pleural effusions larger on RIGHT. Hepatobiliary: Gallbladder and liver normal appearance Pancreas: Normal appearance Spleen: Normal appearance Adrenals/Urinary Tract: Adrenal glands, kidneys, ureters, and bladder normal appearance Stomach/Bowel: Normal appendix. Scattered colonic diverticulosis. Stomach unremarkable. Within limits of exam lacking IV and oral contrast, no gross abnormalities of the bowel loops are identified. Vascular/Lymphatic: Aorta normal caliber.  No adenopathy. Reproductive: Mild prostatic enlargement Other: Scattered free fluid and soft tissue edema. No free air. No hernia. Musculoskeletal: No acute osseous findings. IMPRESSION: Bibasilar pulmonary infiltrates consistent with multifocal pneumonia. Small BILATERAL pleural effusions larger on RIGHT. Scattered colonic diverticulosis without evidence of  diverticulitis. Mild prostatic enlargement. Scattered soft tissue edema and small volume ascites. Electronically Signed   By: Lavonia Dana M.D.   On: 11/05/2021 13:54   DG Chest Portable 1 View  Result Date: 11/05/2021 CLINICAL DATA:  vomiting, missed dialysis EXAM: PORTABLE CHEST 1 VIEW COMPARISON:  10/13/2021 chest radiograph. FINDINGS: Stable cardiomediastinal silhouette with mild cardiomegaly. No pneumothorax. Small left pleural effusion. No right pleural effusion. Extensive patchy and fluffy opacities throughout the lungs bilaterally, predominantly in the lower parahilar lung zones. IMPRESSION: 1. Mild cardiomegaly. Extensive patchy and fluffy opacities throughout the lungs bilaterally, predominantly in the lower parahilar lung zones, favor acute moderate to severe pulmonary edema. Chest radiograph follow-up advised. 2. Small left pleural effusion. Electronically Signed   By: Ilona Sorrel M.D.   On: 11/05/2021 11:26    EKG: Independently reviewed.  Sinus, chronic LBBB  Assessment/Plan Principal Problem:   CHF (congestive heart failure) (HCC) Active Problems:   Acute on chronic combined systolic and diastolic CHF (congestive heart failure) (HCC)   Acute respiratory failure with hypoxemia (HCC)   Acute pulmonary edema (HCC)  (please populate well all problems here in Problem List. (For example, if patient is on BP meds at home and you resume or decide to hold them, it is a problem that needs to be her. Same for CAD, COPD, HLD and so on)  Acute hypoxic respiratory failure -Multifactorial, main etiology appears to be decompensated acute on chronic combined systolic and diastolic CHF secondary to uncontrolled hypertension and noncompliance with dialysis, another possible contributor is aspiration pneumonia  Acute on chronic combined systolic and diastolic CHF decompensation -Lasix 80 mg x 1 -Emergency dialysis as per nephro -Resume home BP meds, add as needed hydralazine -Was done recently  will not repeat  Aspiration pneumonia -From frequent nauseous vomiting since last night, de-escalate antibiotics to Unasyn -Zofran for GI symptoms  HTN emergency -As above  IDDM with hyperglycemia -Secondary to to noncompliance with insulin -Resume NovoLog 5 unit twice daily, add sliding scale -Continue Januvia  Nauseous vomiting and diarrhea -Clinically suspect gastroenteritis, appears to be self-limiting, monitor off antibiotics. -CT abdomen pelvis reassuring  Diabetic neuropathy -Continue gabapentin  DVT prophylaxis: Heparin subcu Code Status: Full code Family Communication: None at bedside Disposition Plan: Expect 1 to 2 days hospital stay, expect dialysis 2 days in a row Consults called: Nephrology Admission status: Telemetry admission   Lequita Halt MD [facility] Pager [phone]  11/05/2021, 3:36 PM

## 2021-11-05 NOTE — ED Notes (Signed)
Lactic 2.3. MD made aware.

## 2021-11-05 NOTE — Progress Notes (Signed)
Heart Failure Navigator Progress Note  Assessed for Heart & Vascular TOC clinic readiness.  Patient does not meet criteria due to hemodialysis.     Jozie Wulf, BSN, RN Heart Failure Nurse Navigator Secure Chat Only   

## 2021-11-05 NOTE — ED Provider Notes (Signed)
[facility]GENC[facility] DEPARTMENT Provider Note   CSN: [id number] Arrival date & time: 11/05/21  1052     History  Chief Complaint  Patient presents with   missed dialysis     [name] is a 51 y.o. male.  HPI 51 year old male presents with vomiting and missed dialysis.  Patient provides the history as well as EMS providers.  Patient was unable to go to dialysis on Saturday (today is Monday) due to not having a ride.  He did go on Thursday.  He states that this morning he ate a biscuit and hashbrowns and orange juice and about an hour later started having vomiting.  He had 2 episodes of diarrhea as well.  EMS reports he was quite hypertensive at over 200/100.  He was tachycardic to 110.  Overall patient is a poor historian.  When asked if he is short of breath, he says "I do not know".  He denies chest pain.  He is having some upper abdominal pain.  Home Medications Prior to Admission medications   Medication Sig Start Date End Date Taking? Authorizing Provider  amLODipine (NORVASC) 10 MG tablet Take 1 tablet (10 mg total) by mouth daily. 10/13/21   Mariel Aloe, MD  atorvastatin (LIPITOR) 40 MG tablet Take 1 tablet (40 mg total) by mouth daily. Patient not taking: Reported on 2/53/6644 0/34/74   Delora Fuel, MD  calcitRIOL (ROCALTROL) 0.25 MCG capsule Take 1 capsule (0.25 mcg total) by mouth daily. Patient not taking: Reported on 2/59/5638 7/56/43   Delora Fuel, MD  calcium gluconate 500 MG tablet Take 1 tablet (500 mg total) by mouth 3 (three) times daily. Patient not taking: Reported on 08/16/5186 09/03/58   Delora Fuel, MD  carvedilol (COREG) 12.5 MG tablet Take 1 tablet (12.5 mg total) by mouth 2 (two) times daily with a meal. 10/13/21   Mariel Aloe, MD  glucose blood test strip Use to check fasting blood sugar once daily. 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 10/13/21 11/12/21  Mariel Aloe, MD  insulin aspart (NOVOLOG FLEXPEN) 100 UNIT/ML FlexPen Inject 5 Units into the skin 2 (two) times daily with a meal. Patient not taking: Reported on 09/28/2021 02/26/21   [name], [name], MD  insulin glargine (LANTUS) 100 UNIT/ML Solostar Pen Inject 10 Units into the skin daily. May substitute with any other long-acting formulary. Patient not taking: Reported on 09/28/2021 05/13/21   [name], MD  Insulin Pen Needle 32G X 4 MM MISC Use with insulin pens 02/26/21   [name], [name], MD  RENVELA 800 MG tablet Take 800-1,600 mg by mouth See admin instructions. Take 2 tablets (1600 mg) three times a day with meals and 1 tablet (800 mg) twice a day as needed with snacks 10/25/21   [provider]  sertraline (ZOLOFT) 50 MG tablet Take 1 tablet (50 mg total) by mouth daily. Patient not taking: Reported on 07/06/2021 11/17/14 0/10/93  Delora Fuel, MD  TRUEplus Lancets 28G MISC USE TO MEASURE BLOOD SUGAR TWICE A DAY 11/29/20 11/29/21  [name], MD  gabapentin (NEURONTIN) 300 MG capsule Take 1 capsule (300 mg total) by mouth 3 (three) times daily. Patient not taking: Reported on 07/27/2019 07/29/18 08/30/19  [name], FNP  insulin detemir (LEVEMIR FLEXTOUCH) 100 UNIT/ML FlexPen Inject 35 Units into the skin  daily. Patient not taking: Reported on 02/17/2021 11/29/20 02/24/21  [name], MD  sildenafil (VIAGRA) 25 MG tablet Take 1 tablet (25 mg total) by mouth as needed for erectile dysfunction. for erectile dysfunction. Patient not taking: Reported on 08/11/2020 10/13/19 11/27/20  [name], FNP  sitaGLIPtin (JANUVIA) 25 MG tablet Take 1 tablet (25 mg total) by mouth daily. Patient not taking: Reported on 08/11/2020 02/22/20 11/27/20  [name], MD      Allergies    Sulfa antibiotics, Keflex [cephalexin], Other, and Bactrim [sulfamethoxazole-trimethoprim]    Review of Systems    Review of Systems  Constitutional:  Negative for fever.  Respiratory:  Negative for shortness of breath.   Cardiovascular:  Negative for chest pain.  Gastrointestinal:  Positive for abdominal pain, diarrhea and vomiting.    Physical Exam Updated Vital Signs BP (!) 166/99   Pulse 92   Temp 98.9 F (37.2 C)   Resp 13   SpO2 98%  Physical Exam Vitals and nursing note reviewed.  Constitutional:      Appearance: He is well-developed. He is not diaphoretic.  HENT:     Head: Normocephalic and atraumatic.  Cardiovascular:     Rate and Rhythm: Regular rhythm. Tachycardia present.     Heart sounds: Normal heart sounds.  Pulmonary:     Effort: Pulmonary effort is normal. Tachypnea present. No accessory muscle usage or respiratory distress.     Breath sounds: Rhonchi present.  Abdominal:     Palpations: Abdomen is soft.     Tenderness: There is abdominal tenderness in the epigastric area.  Skin:    General: Skin is warm and dry.  Neurological:     Mental Status: He is alert.     ED Results / Procedures / Treatments   Labs (all labs ordered are listed, but only abnormal results are displayed) Labs Reviewed  COMPREHENSIVE METABOLIC PANEL - Abnormal; Notable for the following components:      Result Value   CO2 21 (*)    Glucose, Bld 225 (*)    BUN 75 (*)    Creatinine, Ser 11.39 (*)    Calcium 7.7 (*)    Albumin 2.5 (*)    AST 44 (*)    ALT 60 (*)    Alkaline Phosphatase 439 (*)    GFR, Estimated 5 (*)    Anion gap 19 (*)    All other components within normal limits  CBC WITH DIFFERENTIAL/PLATELET - Abnormal; Notable for the following components:   WBC 13.7 (*)    RBC 4.11 (*)    Hemoglobin 11.9 (*)    HCT 37.1 (*)    RDW 17.1 (*)    Platelets 404 (*)    Neutro Abs 11.9 (*)    All other components within normal limits  LACTIC ACID, PLASMA - Abnormal; Notable for the following components:   Lactic Acid, Venous 2.3 (*)    All other components within normal limits   I-STAT ARTERIAL BLOOD GAS, ED - Abnormal; Notable for the following components:   pH, Arterial 7.345 (*)    pO2, Arterial 66 (*)    Calcium, Ion 0.96 (*)    HCT 33.0 (*)    Hemoglobin 11.2 (*)    All other components within normal limits  CULTURE, BLOOD (ROUTINE X 2)  CULTURE, BLOOD (ROUTINE X 2)  MRSA NEXT GEN BY PCR, NASAL  LIPASE, BLOOD  LACTIC ACID, PLASMA    EKG EKG Interpretation  Date/Time:  Monday November 05 2021 11:12:20 EDT Ventricular Rate:  114 PR Interval:    QRS Duration: 155 QT Interval:  393 QTC Calculation: 542 R Axis:   -85 Text Interpretation: Sinus tachycardia Left bundle branch block similar to May 2023 Confirmed by Sherwood Gambler [phone]) on 11/05/2021 12:21:21 PM  Radiology CT ABDOMEN PELVIS WO CONTRAST  Result Date: 11/05/2021 CLINICAL DATA:  Nausea, vomiting, history hypertension, type II diabetes mellitus EXAM: CT ABDOMEN AND PELVIS WITHOUT CONTRAST TECHNIQUE: Multidetector CT imaging of the abdomen and pelvis was performed following the standard protocol without IV contrast. RADIATION DOSE REDUCTION: This exam was performed according to the departmental dose-optimization program which includes automated exposure control, adjustment of the mA and/or kV according to patient size and/or use of iterative reconstruction technique. COMPARISON:  10/21/2011 FINDINGS: Lower chest: Bibasilar pulmonary infiltrates consistent with multifocal pneumonia. Small BILATERAL pleural effusions larger on RIGHT. Hepatobiliary: Gallbladder and liver normal appearance Pancreas: Normal appearance Spleen: Normal appearance Adrenals/Urinary Tract: Adrenal glands, kidneys, ureters, and bladder normal appearance Stomach/Bowel: Normal appendix. Scattered colonic diverticulosis. Stomach unremarkable. Within limits of exam lacking IV and oral contrast, no gross abnormalities of the bowel loops are identified. Vascular/Lymphatic: Aorta normal caliber.  No adenopathy. Reproductive: Mild prostatic  enlargement Other: Scattered free fluid and soft tissue edema. No free air. No hernia. Musculoskeletal: No acute osseous findings. IMPRESSION: Bibasilar pulmonary infiltrates consistent with multifocal pneumonia. Small BILATERAL pleural effusions larger on RIGHT. Scattered colonic diverticulosis without evidence of diverticulitis. Mild prostatic enlargement. Scattered soft tissue edema and small volume ascites. Electronically Signed   By: Lavonia Dana M.D.   On: 11/05/2021 13:54   DG Chest Portable 1 View  Result Date: 11/05/2021 CLINICAL DATA:  vomiting, missed dialysis EXAM: PORTABLE CHEST 1 VIEW COMPARISON:  10/13/2021 chest radiograph. FINDINGS: Stable cardiomediastinal silhouette with mild cardiomegaly. No pneumothorax. Small left pleural effusion. No right pleural effusion. Extensive patchy and fluffy opacities throughout the lungs bilaterally, predominantly in the lower parahilar lung zones. IMPRESSION: 1. Mild cardiomegaly. Extensive patchy and fluffy opacities throughout the lungs bilaterally, predominantly in the lower parahilar lung zones, favor acute moderate to severe pulmonary edema. Chest radiograph follow-up advised. 2. Small left pleural effusion. Electronically Signed   By: Ilona Sorrel M.D.   On: 11/05/2021 11:26    Procedures Ultrasound ED Peripheral IV (Provider)  Date/Time: 11/05/2021 12:11 PM  Performed by: Sherwood Gambler, MD Authorized by: Sherwood Gambler, MD   Procedure details:    Indications: poor IV access     Skin Prep: chlorhexidine gluconate     Location:  Left AC   Angiocath:  20 G   Bedside Ultrasound Guided: Yes     Patient tolerated procedure without complications: Yes     Dressing applied: Yes   .Critical Care  Performed by: Sherwood Gambler, MD Authorized by: Sherwood Gambler, MD   Critical care provider statement:    Critical care time (minutes):  40   Critical care time was exclusive of:  Separately billable procedures and treating other patients    Critical care was necessary to treat or prevent imminent or life-threatening deterioration of the following conditions:  Respiratory failure   Critical care was time spent personally by me on the following activities:  Development of treatment plan with patient or surrogate, discussions with consultants, evaluation of patient's response to treatment, examination of patient, ordering and review of laboratory studies, ordering and review of radiographic studies, ordering and performing treatments and interventions, pulse oximetry, re-evaluation of patient's condition and review of old charts  Medications Ordered in ED Medications  vancomycin (VANCOREADY) IVPB 1500 mg/300 mL (has no administration in time range)  ceFEPIme (MAXIPIME) 2 g in sodium chloride 0.9 % 100 mL IVPB (has no administration in time range)  ceFEPIme (MAXIPIME) 1 g in sodium chloride 0.9 % 100 mL IVPB (has no administration in time range)  vancomycin (VANCOREADY) IVPB 750 mg/150 mL (has no administration in time range)  hydrALAZINE (APRESOLINE) injection 5 mg (has no administration in time range)  ondansetron (ZOFRAN) injection 4 mg (4 mg Intravenous Given 11/05/21 1209)    ED Course/ Medical Decision Making/ A&P Clinical Course as of 11/05/21 1452  Mon Nov 05, 2021  1120 Originally saw patient with no vital signs as he came fully clothed and was slow to let us change him and get him on the monitor.  I was called back in the room because his O2 sats are 58% on room air.  He was put on a nonrebreather.  O2 sats came up.  Coarse lung sounds bilaterally [SG]  1130 Chest x-ray shows likely bilateral edema.  Given his history of missed dialysis this is the most likely cause.  He is afebrile.  We will still get COVID and send blood cultures and see what his labs look like.  He is vomiting and so we will give Zofran but I do not think he could tolerate BiPAP at this point.  His work of breathing is not that bad, mostly oxygen issue.   We will start him on some nitroglycerin for blood pressure.  He will need dialysis. [SG]  1408 Patient tells me that he went to dialysis on Thursday but not Tuesday.  Thus I suspect this is probably fluid overload but the CT of his abdomen and pelvis is concerning for infiltrates with pneumonia.  Will cover with antibiotics as this is a mixed picture and would explain why he is vomiting so much. [SG]    Clinical Course User Index [SG] Sherwood Gambler, MD                           Medical Decision Making Amount and/or Complexity of Data Reviewed Independent Historian: EMS External Data Reviewed: notes. Labs: ordered. Radiology: ordered and independent interpretation performed. ECG/medicine tests: ordered and independent interpretation performed.  Risk Prescription drug management. Decision regarding hospitalization.   Difficult to tell if he is having pulmonary edema from missing dialysis and hypertensive emergency versus pneumonia versus a mixture.  Lactate of 2.3 could be because he was so hypoxic, down to 58% on room air.  Seems to be doing better with antiemetics and oxygen.  O2 sats are okay on the nasal cannula.  He will be treated for both, was given a brief course of nitroglycerin with some partial improvement but with the pneumonia I think we should also cover for that.  I have also discussed with Dr. Royce Macadamia who will arrange dialysis.  Dr. [name] will admit.  Labs do show an elevated WBC which goes along with either vomiting or pneumonia.  Lactate of 2.3 discussed above.  Does have hypoxemia on the ABG.  ECG interpreted by myself and is similar to priors.  I have personally viewed the chest x-ray images and agree with radiology.  Edema/pneumonia.        Final Clinical Impression(s) / ED Diagnoses Final diagnoses:  Acute respiratory failure with hypoxia (Luis Lopez)    Rx / DC Orders ED Discharge Orders     None  Sherwood Gambler, MD 11/05/21 1454

## 2021-11-05 NOTE — ED Notes (Signed)
Walked patient to the bathroom patient did well 

## 2021-11-05 NOTE — Consult Note (Addendum)
[facility] Renal Consultation Note    Indication for Consultation:  Management of ESRD/hemodialysis; anemia, hypertension/volume and secondary hyperparathyroidism PCP: Vevelyn Francois, NP   HPI: [name] is a 51 y.o. male with ESRD on hemodialysis MWF at [facility]. PMH: hypertension, DMT2, nonadherence with HD, SHPT, Anemia of ESRD.  He is homeless. Last HD 11/01/2021 signed off one hour early. Left 3.7 above OP EDW. He says he has been missing HD D/T not being able to find a ride from homeless shelter in [place] to [place]. He has been seen in ED for various reasons 11/01/2021, 11/04/2021 and again today.   He presented to ED 11/05/2021 with C/O vomiting and missed HD. O2 sats were noted to be low on admission. T-98.9 BP 203/106 HR 105 R-17. CXR showed Extensive patchy and fluffy opacities throughout the lungs bilaterally, predominantly in the lower parahilar lung zones, favor acute moderate to severe pulmonary edema. K+ 4.5 CO2 21 SCr 11.39 BUN 75 Ca 7.7 WBC 13.7 HGB 11.9 PLT 404. He has been uncooperative with staff in ED but better behavior now.   Seen in ED. Lying in low fowlers position. Says he is SOB. O2 via [place] O2 sats 98%. SR on monitor. He is hypertensive with 2-3+ BLE LE edema. He C/O rash on back, upper arms, and abdomen that itch. 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 He denies nausea. He has limited vision and has difficulty signing permits but agrees to come to HD. He is homeless, says his Mother recently died. He is asking for something to drink and eat.  Past Medical History:  Diagnosis Date   Anxiety 02/2019   Anxiety 10/2019   Diabetes mellitus type II, uncontrolled 07/17/2006   Elevated alkaline phosphatase level 01/2019   ESSENTIAL HYPERTENSION 07/17/2006   HYPERLIPIDEMIA 11/24/2008   Insomnia 02/2019   Left bundle branch block 02/22/2019   Lesion of lip 10/2019   Pneumonia 10/22/2011   Suicidal ideations 02/2019   Vitamin D deficiency  01/2019   Past Surgical History:  Procedure Laterality Date   ARM SURGERY     AV FISTULA PLACEMENT Right 02/20/2021   Procedure: RIGHT ARTERIOVENOUS GRAFT CREATION;  Surgeon: Angelia Mould, MD;  Location: [place];  Service: Vascular;  Laterality: Right;   INSERTION OF DIALYSIS CATHETER Left 02/20/2021   Procedure: INSERTION OF DIALYSIS CATHETER USING PALINDROME CATHETER;  Surgeon: Angelia Mould, MD;  Location: [facility] OR;  Service: Vascular;  Laterality: Left;   IR REMOVAL TUN CV CATH W/O FL  07/27/2021   ULTRASOUND GUIDANCE FOR VASCULAR ACCESS  02/20/2021   Procedure: ULTRASOUND GUIDANCE FOR VASCULAR ACCESS;  Surgeon: Angelia Mould, MD;  Location: [facility] OR;  Service: Vascular;;   Family History  Problem Relation Age of Onset   Hypertension Mother 36   Cancer Mother        bladder cancer   Alcohol abuse Father    Hypertension Brother   Brother with ESRD on HD as well   Social History:  reports that he has never smoked. He has never used smokeless tobacco. He reports that he does not drink alcohol and does not use drugs. Allergies  Allergen Reactions   Sulfa Antibiotics Rash    Severe rash.    Keflex [Cephalexin] Other (See Comments)    Patient with severe drug reaction including exfoliating skin rash and hypotension after being prescribed both cephalexin and sulfamethoxazole-trimethoprim simultaneously. Favor SMX as much more likely culprit as patient had tolerated other cephalosporins in the past,  but cannot say with complete certainty that this was not related to cephalexin.    Other Other (See Comments)    Patient states he is allergic to the TB test - unknown reaction   Bactrim [Sulfamethoxazole-Trimethoprim] Rash   Prior to Admission medications   Medication Sig Start Date End Date Taking? Authorizing Provider  amLODipine (NORVASC) 10 MG tablet Take 1 tablet (10 mg total) by mouth daily. 10/13/21   Mariel Aloe, MD  atorvastatin (LIPITOR) 40 MG tablet Take 1  tablet (40 mg total) by mouth daily. Patient not taking: Reported on 6/38/7564 3/32/95   Delora Fuel, MD  calcitRIOL (ROCALTROL) 0.25 MCG capsule Take 1 capsule (0.25 mcg total) by mouth daily. Patient not taking: Reported on 1/88/4166 0/63/01   Delora Fuel, MD  calcium gluconate 500 MG tablet Take 1 tablet (500 mg total) by mouth 3 (three) times daily. Patient not taking: Reported on 10/19/930 3/55/73   Delora Fuel, MD  carvedilol (COREG) 12.5 MG tablet Take 1 tablet (12.5 mg total) by mouth 2 (two) times daily with a meal. 10/13/21   Mariel Aloe, MD  glucose blood test strip Use to check fasting blood sugar once daily. 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 Insulin dependent 11/29/20   [name], MD  hydrALAZINE (APRESOLINE) 25 MG tablet Take 1 tablet (25 mg total) by mouth 3 (three) times daily. 10/13/21 11/12/21  Mariel Aloe, MD  insulin aspart (NOVOLOG FLEXPEN) 100 UNIT/ML FlexPen Inject 5 Units into the skin 2 (two) times daily with a meal. Patient not taking: Reported on 09/28/2021 02/26/21   [name], [name], MD  insulin glargine (LANTUS) 100 UNIT/ML Solostar Pen Inject 10 Units into the skin daily. May substitute with any other long-acting formulary. Patient not taking: Reported on 09/28/2021 05/13/21   [name], MD  Insulin Pen Needle 32G X 4 MM MISC Use with insulin pens 02/26/21   [name], [name], MD  RENVELA 800 MG tablet Take 800-1,600 mg by mouth See admin instructions. Take 2 tablets (1600 mg) three times a day with meals and 1 tablet (800 mg) twice a day as needed with snacks 10/25/21   [provider]  sertraline (ZOLOFT) 50 MG tablet Take 1 tablet (50 mg total) by mouth daily. Patient not taking: Reported on 07/06/2021 07/10/23 09/13/04  Delora Fuel, MD  TRUEplus Lancets 28G MISC USE TO MEASURE BLOOD SUGAR TWICE A DAY 11/29/20 11/29/21  [name], MD  gabapentin (NEURONTIN) 300 MG capsule Take 1 capsule (300 mg total) by mouth 3 (three) times daily. Patient not  taking: Reported on 07/27/2019 07/29/18 08/30/19  [name], FNP  insulin detemir (LEVEMIR FLEXTOUCH) 100 UNIT/ML FlexPen Inject 35 Units into the skin daily. Patient not taking: Reported on 02/17/2021 11/29/20 02/24/21  [name], MD  sildenafil (VIAGRA) 25 MG tablet Take 1 tablet (25 mg total) by mouth as needed for erectile dysfunction. for erectile dysfunction. Patient not taking: Reported on 08/11/2020 10/13/19 11/27/20  [name], FNP  sitaGLIPtin (JANUVIA) 25 MG tablet Take 1 tablet (25 mg total) by mouth daily. Patient not taking: Reported on 08/11/2020 02/22/20 11/27/20  [name], MD   Current Facility-Administered Medications  Medication Dose Route Frequency Provider Last Rate Last Admin   ceFEPIme (MAXIPIME) 2 g in sodium chloride 0.9 % 100 mL IVPB  2 g Intravenous Once Bertis Ruddy, [facility]       [START ON 11/06/2021] Chlorhexidine Gluconate Cloth 2 % PADS 6 each  6 each Topical  [id number] Valentina Gu, NP       furosemide (LASIX) injection 80 mg  80 mg Intravenous Once Wynetta Fines T, MD       guaiFENesin [facility]) 12 hr tablet 1,200 mg  1,200 mg Oral BID Wynetta Fines T, MD       hydrALAZINE (APRESOLINE) injection 5 mg  5 mg Intravenous Q6H PRN Lequita Halt, MD       vancomycin (VANCOREADY) IVPB 1500 mg/300 mL  1,500 mg Intravenous Once Bertis Ruddy, [facility]       Current Outpatient Medications  Medication Sig Dispense Refill   amLODipine (NORVASC) 10 MG tablet Take 1 tablet (10 mg total) by mouth daily. 30 tablet 0   atorvastatin (LIPITOR) 40 MG tablet Take 1 tablet (40 mg total) by mouth daily. (Patient not taking: Reported on 07/06/2021) 30 tablet 0   calcitRIOL (ROCALTROL) 0.25 MCG capsule Take 1 capsule (0.25 mcg total) by mouth daily. (Patient not taking: Reported on 07/06/2021) 30 capsule 0   calcium gluconate 500 MG tablet Take 1 tablet (500 mg total) by mouth 3 (three) times daily. (Patient not taking: Reported on 10/13/2021) 15 tablet 0   carvedilol  (COREG) 12.5 MG tablet Take 1 tablet (12.5 mg total) by mouth 2 (two) times daily with a meal. 60 tablet 0   glucose blood test strip Use to check fasting blood sugar once daily. 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 Insulin dependent 100 each 12   hydrALAZINE (APRESOLINE) 25 MG tablet Take 1 tablet (25 mg total) by mouth 3 (three) times daily. 90 tablet 0   insulin aspart (NOVOLOG FLEXPEN) 100 UNIT/ML FlexPen Inject 5 Units into the skin 2 (two) times daily with a meal. (Patient not taking: Reported on 09/28/2021) 15 mL 2   insulin glargine (LANTUS) 100 UNIT/ML Solostar Pen Inject 10 Units into the skin daily. May substitute with any other long-acting formulary. (Patient not taking: Reported on 09/28/2021) 15 mL 0   Insulin Pen Needle 32G X 4 MM MISC Use with insulin pens 100 each 1   RENVELA 800 MG tablet Take 800-1,600 mg by mouth See admin instructions. Take 2 tablets (1600 mg) three times a day with meals and 1 tablet (800 mg) twice a day as needed with snacks     sertraline (ZOLOFT) 50 MG tablet Take 1 tablet (50 mg total) by mouth daily. (Patient not taking: Reported on 07/06/2021) 30 tablet 0   TRUEplus Lancets 28G MISC USE TO MEASURE BLOOD SUGAR TWICE A DAY 100 each 1   Labs: Basic Metabolic Panel: Recent Labs  Lab 11/05/21 1208 11/05/21 1227  NA 142 138  K 4.5 4.1  CL 102  --   CO2 21*  --   GLUCOSE 225*  --   BUN 75*  --   CREATININE 11.39*  --   CALCIUM 7.7*  --    Liver Function Tests: Recent Labs  Lab 11/05/21 1208  AST 44*  ALT 60*  ALKPHOS 439*  BILITOT 1.0  PROT 7.5  ALBUMIN 2.5*   Recent Labs  Lab 11/05/21 1208  LIPASE 34   No results for input(s): "AMMONIA" in the last 168 hours. CBC: Recent Labs  Lab 11/05/21 1208 11/05/21 1227  WBC 13.7*  --   NEUTROABS 11.9*  --   HGB 11.9* 11.2*  HCT 37.1* 33.0*  MCV 90.3  --   PLT 404*  --    Cardiac Enzymes: No results for input(s): "CKTOTAL", "CKMB", "CKMBINDEX", "TROPONINI" in the last 168 hours. CBG: No  results for  input(s): "GLUCAP" in the last 168 hours. Iron Studies: No results for input(s): "IRON", "TIBC", "TRANSFERRIN", "FERRITIN" in the last 72 hours. Studies/Results: CT ABDOMEN PELVIS WO CONTRAST  Result Date: 11/05/2021 CLINICAL DATA:  Nausea, vomiting, history hypertension, type II diabetes mellitus EXAM: CT ABDOMEN AND PELVIS WITHOUT CONTRAST TECHNIQUE: Multidetector CT imaging of the abdomen and pelvis was performed following the standard protocol without IV contrast. RADIATION DOSE REDUCTION: This exam was performed according to the departmental dose-optimization program which includes automated exposure control, adjustment of the mA and/or kV according to patient size and/or use of iterative reconstruction technique. COMPARISON:  10/21/2011 FINDINGS: Lower chest: Bibasilar pulmonary infiltrates consistent with multifocal pneumonia. Small BILATERAL pleural effusions larger on RIGHT. Hepatobiliary: Gallbladder and liver normal appearance Pancreas: Normal appearance Spleen: Normal appearance Adrenals/Urinary Tract: Adrenal glands, kidneys, ureters, and bladder normal appearance Stomach/Bowel: Normal appendix. Scattered colonic diverticulosis. Stomach unremarkable. Within limits of exam lacking IV and oral contrast, no gross abnormalities of the bowel loops are identified. Vascular/Lymphatic: Aorta normal caliber.  No adenopathy. Reproductive: Mild prostatic enlargement Other: Scattered free fluid and soft tissue edema. No free air. No hernia. Musculoskeletal: No acute osseous findings. IMPRESSION: Bibasilar pulmonary infiltrates consistent with multifocal pneumonia. Small BILATERAL pleural effusions larger on RIGHT. Scattered colonic diverticulosis without evidence of diverticulitis. Mild prostatic enlargement. Scattered soft tissue edema and small volume ascites. Electronically Signed   By: Lavonia Dana M.D.   On: 11/05/2021 13:54   DG Chest Portable 1 View  Result Date: 11/05/2021 CLINICAL DATA:  vomiting,  missed dialysis EXAM: PORTABLE CHEST 1 VIEW COMPARISON:  10/13/2021 chest radiograph. FINDINGS: Stable cardiomediastinal silhouette with mild cardiomegaly. No pneumothorax. Small left pleural effusion. No right pleural effusion. Extensive patchy and fluffy opacities throughout the lungs bilaterally, predominantly in the lower parahilar lung zones. IMPRESSION: 1. Mild cardiomegaly. Extensive patchy and fluffy opacities throughout the lungs bilaterally, predominantly in the lower parahilar lung zones, favor acute moderate to severe pulmonary edema. Chest radiograph follow-up advised. 2. Small left pleural effusion. Electronically Signed   By: Ilona Sorrel M.D.   On: 11/05/2021 11:26    ROS: As per HPI otherwise negative.  Physical Exam: Vitals:   11/05/21 1251 11/05/21 1345 11/05/21 1415 11/05/21 1515  BP:  (!) 167/87 (!) 166/99 (!) 164/102  Pulse: 96 95 92 88  Resp: (!) 26 (!) 26 13 12   Temp:      SpO2: 96% 96% 98% 93%     General: Chronically ill appearing male in no acute distress. Head: Normocephalic, atraumatic, sclera non-icteric, mucus membranes are moist Neck: Supple. JVD elevated 1/2 way to mandible.  Lungs: Bilateral breath sounds with bibasilar crackles, few scattered upper airway rhonchi. No WOB.  Heart: RRR with S1 S2. No M/R/G. SR/ST on monitor.  Abdomen: Soft, non-tender, non-distended with normoactive bowel sounds. No rebound/guarding. No obvious abdominal masses. Lower extremities: 2-3+ BLE pitting edema. Sacral edema present.  Neuro: Alert and oriented X 3. Moves all extremities spontaneously. Psych:  Responds to questions appropriately with a normal affect. Dialysis Access: R AVG + T/B  Dialysis Orders: Center: [place] T,Th,S 4 hrs 180NRe 450/Autoflow 1.5 EDW 71 kg 2.0K/3.0 Ca AVG -Heparin 2000 units IV TIW -Mircera 50 mcg IV q 2 weeks (last dose 11/01/2021) -Venofer 100 mg IV X 10 doses (1/10 doses given 11/01/2021) -Calcitriol 0.75 mcg PO TIW  Assessment/Plan:  Acute  hypoxic respiratory failure in setting of missed HD. HD today off schedule for volume removal.   Possible aspiration PNA-afebrile. ABX per  primary  N & V-no further nausea since he rec'd antiemetic. Per primary.   HTN emergency-If he is homeless, doubtful he has been able to afford any medications. Resume home medications. Lower volume as tolerated.   ESRD -   Hypertension/volume  - as noted above.   Anemia  - HGB 11.9. Recent OP ESA dose. Hold Fe load until infection ruled out.   Metabolic bone disease - Continue VDRA, binders.   Nutrition - Renal Carb mod diet. Nepro.   DMT2-per primary  Jimmye Norman. Owens Shark, NP-C 11/05/2021, 3:39 PM  [facility] Beeper [phone]   Seen and examined independently.  Agree with note and exam as documented above by physician extender and as noted here.  [name] is a pleasant gentleman with a history of ESRD on HD MWF at [facility], HTN, type 2 DM, nonadherence with HD, and anemia who presents for nausea and vomiting.  Last HD was 11/01/21.  He has been in a homeless shelter and has not been able to get a ride.  Of note his mother has just passed away in the past week.  He had CXR suggestive of pulmonary edema and multifocal PNA.  His sats dropped to 70's or less initially per ER report; he was on a non-rebreather initially and then was transferred to nasal cannula.    General adult male in bed in no acute distress HEENT normocephalic atraumatic extraocular movements intact sclera anicteric Neck supple trachea midline Lungs clear to auscultation bilaterally normal work of breathing at rest on oxygen via nasal cannula  Heart regular rate and rhythm no rubs or gallops appreciated Abdomen soft nontender nondistended Extremities 2+ edema  Psych normal mood and affect RUE AVF bruit and thrill   Acute hypoxic respiratory failure  - optimize volume with HD   Aspiration PNA  - abx per primary team - pharmacy is dosing unasyn  - optimize volume status to  improve lung compliance   N/v  - supportive care per primary team; HTN and missed HD - hopefully will improve with improvement in BP  HTN emergency - for HD and would resume home regimen.  Agree that compliance is doubtful if he has been homeless recently and now at a shelter  ESRD  - HD per TTS schedule normally but has missed last tx and here with distress.  HD today and then back on HD per TTS schedule   Anemia of CKD - no acute indication for ESA. Hb 82.9  Metabolic bone disease - please do not include calcitriol on his discharge med list to avoid duplication.  We have ordered calcitriol 0.75 mcg three times a week with HD TTS (his correct outpatient dose)  Disposition per primary team.  Appreciate primary team and SW.  He is currently living at a homeless shelter   Claudia Desanctis, MD 11/05/2021 4:44 PM

## 2021-11-05 NOTE — ED Notes (Signed)
Pt on 6L nasal cannula  as O2 dropped down to 72%.

## 2021-11-05 NOTE — ED Triage Notes (Signed)
Pt BIB [facility] d/t missed dialysis. Per Pt, he missed his Saturday dialysis. Pt c/o SOB and hypertension. A&O X4.

## 2021-11-05 NOTE — Progress Notes (Signed)
Hd terminated early per pt request. 3.20hrs completed. Dr Candiss Norse, [facility] notified. No new orders. Pt tolerated well. No s/s of complications. Pt transported back to room, aox4, no pt acute distress noted. Report given to [employer], rn.  Total uf removed: 2712ml Post hd vs: 167/91 98.1  91 16 100% Post hd weight: 74.3kg

## 2021-11-05 NOTE — Progress Notes (Signed)
Patient having high venous pressure with initial cannulation.  Venous site accessed and noted to aspirate and flush without difficulty.  Site continued to have venous pressures.  This writer at the bedside to re-cannulate venous site, site aspirated and flushed without difficulty.  Treatment resumed, venous pressures noted to escalate again.  Venous cannulate x1 again.  Upon resuming treatment venous pressure was still elevated some but not as high as previous.  BFR lowered to 250 in order for patient to dialyze.  Patient had not had a dialysis treatment since last Thursday.  Juanell Fairly, NP made aware of incident.  Orders received to continue treatment at reduced BFR.  Velva Harman confirmed that patient will have a shuntogram done on tomorrow prior to having dialysis again.  Patient will continue to be monitored throughout treatment.

## 2021-11-05 NOTE — ED Notes (Signed)
Patient is now back in bed on the monitor with call bell in reach got patent another blanket

## 2021-11-06 ENCOUNTER — Other Ambulatory Visit (HOSPITAL_COMMUNITY): Payer: Self-pay

## 2021-11-06 ENCOUNTER — Inpatient Hospital Stay (HOSPITAL_COMMUNITY): Payer: Commercial Managed Care - HMO

## 2021-11-06 DIAGNOSIS — N186 End stage renal disease: Secondary | ICD-10-CM

## 2021-11-06 DIAGNOSIS — I5043 Acute on chronic combined systolic (congestive) and diastolic (congestive) heart failure: Secondary | ICD-10-CM | POA: Diagnosis not present

## 2021-11-06 DIAGNOSIS — I10 Essential (primary) hypertension: Secondary | ICD-10-CM | POA: Diagnosis not present

## 2021-11-06 HISTORY — PX: IR AV DIALY SHUNT INTRO NEEDLE/INTRACATH INITIAL W/PTA/IMG RIGHT: IMG6116

## 2021-11-06 HISTORY — PX: IR US GUIDE VASC ACCESS RIGHT: IMG2390

## 2021-11-06 LAB — CBC
HCT: 28.1 % — ABNORMAL LOW (ref 39.0–52.0)
Hemoglobin: 9.1 g/dL — ABNORMAL LOW (ref 13.0–17.0)
MCH: 28.6 pg (ref 26.0–34.0)
MCHC: 32.4 g/dL (ref 30.0–36.0)
MCV: 88.4 fL (ref 80.0–100.0)
Platelets: 305 10*3/uL (ref 150–400)
RBC: 3.18 MIL/uL — ABNORMAL LOW (ref 4.22–5.81)
RDW: 16.5 % — ABNORMAL HIGH (ref 11.5–15.5)
WBC: 11.8 10*3/uL — ABNORMAL HIGH (ref 4.0–10.5)
nRBC: 0 % (ref 0.0–0.2)

## 2021-11-06 LAB — BASIC METABOLIC PANEL
Anion gap: 14 (ref 5–15)
BUN: 51 mg/dL — ABNORMAL HIGH (ref 6–20)
CO2: 24 mmol/L (ref 22–32)
Calcium: 7.3 mg/dL — ABNORMAL LOW (ref 8.9–10.3)
Chloride: 99 mmol/L (ref 98–111)
Creatinine, Ser: 8.31 mg/dL — ABNORMAL HIGH (ref 0.61–1.24)
GFR, Estimated: 7 mL/min — ABNORMAL LOW (ref >60)
Glucose, Bld: 210 mg/dL — ABNORMAL HIGH (ref 70–99)
Potassium: 3.9 mmol/L (ref 3.5–5.1)
Sodium: 137 mmol/L (ref 135–145)

## 2021-11-06 LAB — GLUCOSE, CAPILLARY
Glucose-Capillary: 177 mg/dL — ABNORMAL HIGH (ref 70–99)
Glucose-Capillary: 165 mg/dL — ABNORMAL HIGH (ref 70–99)
Glucose-Capillary: 112 mg/dL — ABNORMAL HIGH (ref 70–99)
Glucose-Capillary: 257 mg/dL — ABNORMAL HIGH (ref 70–99)

## 2021-11-06 LAB — PHOSPHORUS: Phosphorus: 6.4 mg/dL — ABNORMAL HIGH (ref 2.5–4.6)

## 2021-11-06 LAB — ALBUMIN: Albumin: 1.8 g/dL — ABNORMAL LOW (ref 3.5–5.0)

## 2021-11-06 MED ORDER — LIDOCAINE HCL 1 % IJ SOLN
INTRAMUSCULAR | Status: AC
  Administered 2021-11-06: 10 mL
  Filled 2021-11-06: qty 20

## 2021-11-06 MED ORDER — FENTANYL CITRATE (PF) 100 MCG/2ML IJ SOLN
INTRAMUSCULAR | Status: AC
  Filled 2021-11-06: qty 2

## 2021-11-06 MED ORDER — DIPHENOXYLATE-ATROPINE 2.5-0.025 MG/5ML PO LIQD
5.0000 mL | Freq: Four times a day (QID) | ORAL | Status: DC | PRN
  Administered 2021-11-06: 5 mL via ORAL
  Filled 2021-11-06 (×2): qty 5

## 2021-11-06 MED ORDER — MIDAZOLAM HCL 2 MG/2ML IJ SOLN
INTRAMUSCULAR | Status: AC
  Filled 2021-11-06: qty 2

## 2021-11-06 MED ORDER — FENTANYL CITRATE (PF) 100 MCG/2ML IJ SOLN
INTRAMUSCULAR | Status: AC | PRN
  Administered 2021-11-06: 25 ug via INTRAVENOUS

## 2021-11-06 MED ORDER — IOHEXOL 300 MG/ML  SOLN: 100.0000 mL | Freq: Once | INTRAMUSCULAR | Status: DC | PRN

## 2021-11-06 MED ORDER — ORAL CARE MOUTH RINSE: 15.0000 mL | OROMUCOSAL | Status: DC | PRN

## 2021-11-06 MED ORDER — MIDAZOLAM HCL 2 MG/2ML IJ SOLN
INTRAMUSCULAR | Status: AC | PRN
  Administered 2021-11-06: .5 mg via INTRAVENOUS

## 2021-11-06 NOTE — Progress Notes (Signed)
TRH night cross cover note:   Following this evening's hemodialysis session, I was notified by RN that telemetry is indicating ST elevation in multiple leads shortly after MN on 11/06/21.  Patient currently without any chest pain.  Vital signs stable.  To further assess, updated EKG was obtained, and showed sinus rhythm with heart rate 78, left bundle branch block that was also seen on admission EKG as well as most recent prior EKG from 10/16/2021, while also demonstrating nonspecific T wave inversion in lead I, and unchanged T wave inversion in aVL and V6 relative to admitting EKG as well as EKG from May 2023, and no evidence of ST changes, including new ST elevation, compared to these most recent EKGs.   Overall, this updated EKG appears reassuring, demonstrating sinus rhythm with known left bundle branch block, nonspecific T wave inversion limited to lead I, and otherwise no evidence of interval T wave or ST changes, including no interval ST elevation.     Babs Bertin, DO Hospitalist

## 2021-11-06 NOTE — Progress Notes (Signed)
Contacted by nephrologist this am regarding pt. Per report, pt was staying in a homeless shelter [place] prior to [place] and was having difficulty getting to out-pt HD at [facility]). Pt needing shelter placement locally if possible so pt get to/from HD appts. Contacted TOC staff via secure chat regarding pt's situation and need for resources. Also left message for clinic SW to inquire if she has any knowledge of pt's needs/situation. Pt receives out-pt HD at [facility] on TTS. Pt arrives at 12:05 for 12:25 chair time. Will assist as needed.   Melven Sartorius Renal Navigator [phone]

## 2021-11-06 NOTE — Assessment & Plan Note (Signed)
Continue blood pressure control with carvedilol.  

## 2021-11-06 NOTE — Progress Notes (Addendum)
[facility] Progress Note   Subjective: Seen in room. Blanket over his head but talking. He denies chest pain, SOB. Hypotensive, amlodipine has been dc'd. Seen by IR for shuntogram at 0915 AM.  Will have HD later today after shuntogram.    Objective Vitals:   11/06/21 0200 11/06/21 0522 11/06/21 0525 11/06/21 0736  BP:  (!) 94/59 101/65 104/62  Pulse: 62 (!) 59 60   Resp:  16 16 18   Temp:   98.8 F (37.1 C)   TempSrc:   Oral   SpO2: 100% 98% 97%   Weight:       General: Chronically ill appearing male in no acute distress. Head: Normocephalic, atraumatic, sclera non-icteric, mucus membranes are moist Neck: Supple. No JVD Lungs: CTAB.  No WOB.  Heart: RRR with S1 S2. No M/R/G. SR on monitor.  Abdomen: NABS, NT, ND Lower extremities: trace 1+ BLE pitting edema.  Neuro: Alert and oriented X 3. Moves all extremities spontaneously. Psych:  Responds to questions appropriately with a normal affect. Dialysis Access: R AVG + T/B   Additional Objective Labs: Basic Metabolic Panel: Recent Labs  Lab 11/05/21 1208 11/05/21 1227 11/06/21 0328  NA 142 138 137  K 4.5 4.1 3.9  CL 102  --  99  CO2 21*  --  24  GLUCOSE 225*  --  210*  BUN 75*  --  51*  CREATININE 11.39*  --  8.31*  CALCIUM 7.7*  --  7.3*   Liver Function Tests: Recent Labs  Lab 11/05/21 1208  AST 44*  ALT 60*  ALKPHOS 439*  BILITOT 1.0  PROT 7.5  ALBUMIN 2.5*   Recent Labs  Lab 11/05/21 1208  LIPASE 34   CBC: Recent Labs  Lab 11/05/21 1208 11/05/21 1227 11/06/21 0328  WBC 13.7*  --  11.8*  NEUTROABS 11.9*  --   --   HGB 11.9* 11.2* 9.1*  HCT 37.1* 33.0* 28.1*  MCV 90.3  --  88.4  PLT 404*  --  305   Blood Culture    Component Value Date/Time   SDES BLOOD LEFT ANTECUBITAL 11/05/2021 1208   SPECREQUEST  11/05/2021 1208    BOTTLES DRAWN AEROBIC AND ANAEROBIC Blood Culture results may not be optimal due to an inadequate volume of blood received in culture bottles   CULT   11/05/2021 1208    NO GROWTH < 24 HOURS Performed at [facility], [place] [address], [place],  [address]    REPTSTATUS PENDING 11/05/2021 1208    Cardiac Enzymes: No results for input(s): "CKTOTAL", "CKMB", "CKMBINDEX", "TROPONINI" in the last 168 hours. CBG: Recent Labs  Lab 11/05/21 2136 11/06/21 0854  GLUCAP 131* 177*   Iron Studies: No results for input(s): "IRON", "TIBC", "TRANSFERRIN", "FERRITIN" in the last 72 hours. @lablastinr3 @ Studies/Results: CT ABDOMEN PELVIS WO CONTRAST  Result Date: 11/05/2021 CLINICAL DATA:  Nausea, vomiting, history hypertension, type II diabetes mellitus EXAM: CT ABDOMEN AND PELVIS WITHOUT CONTRAST TECHNIQUE: Multidetector CT imaging of the abdomen and pelvis was performed following the standard protocol without IV contrast. RADIATION DOSE REDUCTION: This exam was performed according to the departmental dose-optimization program which includes automated exposure control, adjustment of the mA and/or kV according to patient size and/or use of iterative reconstruction technique. COMPARISON:  10/21/2011 FINDINGS: Lower chest: Bibasilar pulmonary infiltrates consistent with multifocal pneumonia. Small BILATERAL pleural effusions larger on RIGHT. Hepatobiliary: Gallbladder and liver normal appearance Pancreas: Normal appearance Spleen: Normal appearance Adrenals/Urinary Tract: Adrenal glands, kidneys, ureters, and  bladder normal appearance Stomach/Bowel: Normal appendix. Scattered colonic diverticulosis. Stomach unremarkable. Within limits of exam lacking IV and oral contrast, no gross abnormalities of the bowel loops are identified. Vascular/Lymphatic: Aorta normal caliber.  No adenopathy. Reproductive: Mild prostatic enlargement Other: Scattered free fluid and soft tissue edema. No free air. No hernia. Musculoskeletal: No acute osseous findings. IMPRESSION: Bibasilar pulmonary infiltrates consistent with multifocal pneumonia. Small BILATERAL pleural  effusions larger on RIGHT. Scattered colonic diverticulosis without evidence of diverticulitis. Mild prostatic enlargement. Scattered soft tissue edema and small volume ascites. Electronically Signed   By: Lavonia Dana M.D.   On: 11/05/2021 13:54   DG Chest Portable 1 View  Result Date: 11/05/2021 CLINICAL DATA:  vomiting, missed dialysis EXAM: PORTABLE CHEST 1 VIEW COMPARISON:  10/13/2021 chest radiograph. FINDINGS: Stable cardiomediastinal silhouette with mild cardiomegaly. No pneumothorax. Small left pleural effusion. No right pleural effusion. Extensive patchy and fluffy opacities throughout the lungs bilaterally, predominantly in the lower parahilar lung zones. IMPRESSION: 1. Mild cardiomegaly. Extensive patchy and fluffy opacities throughout the lungs bilaterally, predominantly in the lower parahilar lung zones, favor acute moderate to severe pulmonary edema. Chest radiograph follow-up advised. 2. Small left pleural effusion. Electronically Signed   By: Ilona Sorrel M.D.   On: 11/05/2021 11:26   Medications:  sodium chloride     ampicillin-sulbactam (UNASYN) IV 3 g (11/06/21 1153)    calcitRIOL  0.75 mcg Oral Q T,Th,Sat-1800   calcium carbonate  1 tablet Oral TID   carvedilol  12.5 mg Oral BID WC   Chlorhexidine Gluconate Cloth  6 each Topical Q0600   guaiFENesin  1,200 mg Oral BID   heparin  2,000 Units Dialysis Once in dialysis   heparin  5,000 Units Subcutaneous Q12H   hydrALAZINE  25 mg Oral TID   insulin aspart  0-6 Units Subcutaneous TID WC   insulin aspart  5 Units Subcutaneous BID WC   sevelamer carbonate  1,600 mg Oral TID WC   And   sevelamer carbonate  800 mg Oral With snacks   sodium chloride flush  3 mL Intravenous Q12H     Dialysis Orders: Center: [facility] T,Th,S 4 hrs 180NRe 450/Autoflow 1.5 EDW 71 kg 2.0K/3.0 Ca AVG -Heparin 2000 units IV TIW -Mircera 50 mcg IV q 2 weeks (last dose 11/01/2021) -Venofer 100 mg IV X 10 doses (1/10 doses given 11/01/2021) -Calcitriol 0.75  mcg PO TIW   Assessment/Plan:  Acute hypoxic respiratory failure in setting of missed HD. HD 11/05/2021 off schedule for volume removal. Net UF 2.8 L. Volume improved but still above OP EDW. Continue to lower volume as tolerated.   Possible aspiration PNA-afebrile. ABX per primary  N & V-no further nausea since he rec'd antiemetic. Per primary.   Hypotension: Present with hypertension, has been resumed on home medications. Now hypotensive. Amlodipine stopped.   ESRD -T,TH,S HD 06/19 off schedule. HD again today after shuntogram to resume T,Th,S schedule.   Anemia  - HGB 11.9. on admission. Down to 9.4 today.  Recent OP ESA dose. Hold Fe load until infection ruled out.   Metabolic bone disease - Continue VDRA, binders. RFP added to today's labs.   Nutrition - Renal Carb mod diet. Nepro.   DMT2-per primary  Jimmye Norman. Brown NP-C 11/06/2021, 11:55 AM  [employer] [phone]   Seen and examined independently.  Agree with note and exam as documented above by physician extender and as noted here.  He had issues with cannulation last HD and a fistulogram was ordered.  General adult male in bed in no acute distress HEENT normocephalic atraumatic extraocular movements intact sclera anicteric Neck supple trachea midline Lungs clear to auscultation bilaterally normal work of breathing at rest  Heart regular rate and rhythm no rubs or gallops appreciated Abdomen soft nontender nondistended Extremities 2+ edema  Psych normal mood and affect RUE AVF bruit and thrill    Acute hypoxic respiratory failure  - optimize volume with HD    Aspiration PNA  - abx per primary team - pharmacy is dosing unasyn  - optimize volume status to improve lung compliance    N/v  - improved per pt   HTN emergency - compliance is doubtful if he has been homeless recently and now at a shelter.  Note hypotension and discontinued norvasc (this was ordered but never given).  I have also just  discontinued hydralazine. Add back if needed.    ESRD  - HD per TTS schedule  - getting a fistulagram with HD after that today   Anemia of CKD - no acute indication for ESA. Hb 39.0   Metabolic bone disease - on calcitriol with HD.  please do not include calcitriol on his discharge med list to avoid duplication.     Disposition per primary team.  Appreciate primary team.  He is currently living at a homeless shelter   Claudia Desanctis, MD 11/06/2021 1:22 PM

## 2021-11-06 NOTE — Assessment & Plan Note (Addendum)
Patient with volume overload due to ESRD  His volume has improved after UF. Oxymetry is 98% on 2 L/min per [place]  Plan to continue renal replacement therapy per nephrology recommendations.  Patient had fistulogram today for fistula malfunction.   Continue with caclitriol and renvela for metabolic bone disease.  Today K is 3,9 with serum bicarbonate at 24, BUN 51. P 6.4 (hyperphosphatemia).

## 2021-11-06 NOTE — Progress Notes (Signed)
Progress Note   Patient: [name] JYN:[id number] DOB: 07-Oct-1970 DOA: 11/05/2021     1 DOS: the patient was seen and examined on 11/06/2021   Brief hospital course: [name] was admitted to the hospital with the working diagnosis of volume overload in the setting of ESRD, non compliance with hemodialysis.   51 yo male with the past medical history of ESRD on HD, hypertension, dyslipidemia, T2DM,  who presented with dyspnea, nausea and vomiting. Patient has not been compliant with his antihypertensive medications or insulin at home. Reported exertional dyspnea and cough. 12 hrs of nausea, vomiting and diarrhea. I missed his last HD session 48 hrs prior to admission due to dyspnea. On his initial physical examination his blood pressure was 167/87, HR 95, RR 26, 02 saturation 96%, lungs with bilateral rales, increased work of breathing, heart with S1 and S2 present and rhythmic with no gallops, abdomen not distended, positive lower extremity edema.   Na 142, K 4,5 CL 102, bicarbonate 102, bicarbonate 21, glucose 225, bun 75 cr 11,3 Ca I 0.96  AST 44 and ALT 60  Lactic acid 2,3  Wbc 13,7 hgb 11,9 plt 404  CT abdomen and pelvis with small bilateral pleural effusion, bilateral bibasilar pulmonary infiltrates.   Chest radiograph with cardiomegaly and bilateral interstitial infiltrates predominantly at bases.   EKG 114, left axis deviation, left bundle branch block, QTc 542, sinus rhythm with poor R wave progression, with no significant ST segment or T wave changes.   Patient was placed on antibiotic therapy for bilateral pneumonia.   Underwent urgent hemodialysis with ultrafiltration.   Fistula malfunction and consulted IR for fistulogram.   Assessment and Plan: ESRD (end stage renal disease) ([place]) Patient with volume overload due to ESRD  His volume has improved after UF. Oxymetry is 98% on 2 L/min per   Plan to continue renal replacement therapy per nephrology recommendations.   Patient had fistulogram today for fistula malfunction.   Continue with caclitriol and renvela for metabolic bone disease.  Today K is 3,9 with serum bicarbonate at 24, BUN 51. P 6.4 (hyperphosphatemia).     Acute on chronic combined systolic and diastolic CHF (congestive heart failure) (HCC) Volume overload due to ESRD non compliant with HD  11/2020 Echocardiogram with reduced EF 30 to 35% with global hypokinesis, severe concentric LVH, RV function preserved. No significant valvular disease.   Continue fluid removal per ultrafiltration Continue with carvedilol.   CAP (community acquired pneumonia) Acute hypoxemic respiratory failure due to pulmonary edema and bilateral lower lobes pneumonia.  Wbc is 11,8  Possible aspiration pneumonia, continue with Unasyn for antibiotic therapy.    T2DM (type 2 diabetes mellitus) ([place]) Hyperglycemia.   Continue glucose cover and monitoring with insulin sliding scale. Fasting glucose is 210   Essential hypertension Continue blood pressure control with carvedilol.         Subjective: Patient is feeling very weak today, dyspnea has improved, no chest pain   Physical Exam: Vitals:   11/06/21 1520 11/06/21 1525 11/06/21 1530 11/06/21 1553  BP: 127/81 (!) 122/92 120/83 104/67  Pulse: (!) 54 (!) 55 (!) 54 (!) 55  Resp: 11 14 13 14   Temp:      TempSrc:      SpO2: 100% 100% 100% 98%  Weight:       Neurology somnolent at the time of my examination post procedure ENT with no pallor Cardiovascular with S1 and S2 present and rhythmic with no gallops, rubs or murmurs Respiratory  with rales at bases Abdomen not distended No lower extremity edema  Data Reviewed:    Family Communication: no family at the bedside   Disposition: Status is: Inpatient Remains inpatient appropriate because: renal failure   Planned Discharge Destination: Home     Author: Tawni Millers, MD 11/06/2021 5:16 PM  For on call review  [email or web address].

## 2021-11-06 NOTE — Assessment & Plan Note (Addendum)
Acute hypoxemic respiratory failure due to pulmonary edema and bilateral lower lobes pneumonia.  Wbc is 11,8  Possible aspiration pneumonia, continue with Unasyn for antibiotic therapy.

## 2021-11-06 NOTE — Hospital Course (Addendum)
51 year old man PMH ESRD presented with shortness of breath and vomiting secondary to missed dialysis and uremia.  Admitted for volume overload and acute hypoxic respiratory failure. --6/21 feels improved status post dialysis, still on oxygen which is new, breathing better.

## 2021-11-06 NOTE — Assessment & Plan Note (Addendum)
Hyperglycemia.   Continue glucose cover and monitoring with insulin sliding scale. Fasting glucose is 210

## 2021-11-06 NOTE — Procedures (Signed)
Interventional Radiology Procedure Note  Procedure: Right upper extremity fistulagram and venoplasty.  Fistula ready for use.  Indication: Increased venous pressures  Findings: Please refer to procedural dictation for full description.  Complications: None  EBL: < 10 mL  Miachel Roux, MD [phone]

## 2021-11-06 NOTE — Consult Note (Signed)
Chief Complaint: Patient was seen in consultation today for malfunctioning AVG fistula at the request of Dr. Sabino Gasser  Supervising Physician: Mir, Sharen Heck  Patient Status: [facility] - In-pt  History of Present Illness: [name] is a 51 y.o. male with ESRD on hemodialysis MWF at [facility]. PMH: hypertension, DMT2, nonadherence with HD, SHPT, Anemia of ESRD.  He has missed some of his dialysis sessions and came into ED yesterday.  He required "emergency" dialysis at admission, but due to high pressures dialysis could only be performed using reduced BFR.  IR asked to perform fistologram.  Patient expresses currently feeling well with no complaints.    Past Medical History:  Diagnosis Date   Anxiety 02/2019   Anxiety 10/2019   Diabetes mellitus type II, uncontrolled 07/17/2006   Elevated alkaline phosphatase level 01/2019   ESSENTIAL HYPERTENSION 07/17/2006   HYPERLIPIDEMIA 11/24/2008   Insomnia 02/2019   Left bundle branch block 02/22/2019   Lesion of lip 10/2019   Pneumonia 10/22/2011   Suicidal ideations 02/2019   Vitamin D deficiency 01/2019    Past Surgical History:  Procedure Laterality Date   ARM SURGERY     AV FISTULA PLACEMENT Right 02/20/2021   Procedure: RIGHT ARTERIOVENOUS GRAFT CREATION;  Surgeon: Angelia Mould, MD;  Location: [facility];  Service: Vascular;  Laterality: Right;   INSERTION OF DIALYSIS CATHETER Left 02/20/2021   Procedure: INSERTION OF DIALYSIS CATHETER USING PALINDROME CATHETER;  Surgeon: Angelia Mould, MD;  Location: [facility];  Service: Vascular;  Laterality: Left;   IR REMOVAL TUN CV CATH W/O FL  07/27/2021   ULTRASOUND GUIDANCE FOR VASCULAR ACCESS  02/20/2021   Procedure: ULTRASOUND GUIDANCE FOR VASCULAR ACCESS;  Surgeon: Angelia Mould, MD;  Location: [facility] OR;  Service: Vascular;;    Allergies: Sulfa antibiotics, Keflex [cephalexin], Other, and Bactrim [sulfamethoxazole-trimethoprim]  Medications: Prior to  Admission medications   Medication Sig Start Date End Date Taking? Authorizing Provider  amLODipine (NORVASC) 10 MG tablet Take 1 tablet (10 mg total) by mouth daily. 10/13/21   Mariel Aloe, MD  atorvastatin (LIPITOR) 40 MG tablet Take 1 tablet (40 mg total) by mouth daily. Patient not taking: Reported on 7/61/9509 08/12/69   Delora Fuel, MD  calcitRIOL (ROCALTROL) 0.25 MCG capsule Take 1 capsule (0.25 mcg total) by mouth daily. Patient not taking: Reported on 2/45/8099 8/33/82   Delora Fuel, MD  calcium gluconate 500 MG tablet Take 1 tablet (500 mg total) by mouth 3 (three) times daily. Patient not taking: Reported on 09/22/3974 7/34/19   Delora Fuel, MD  carvedilol (COREG) 12.5 MG tablet Take 1 tablet (12.5 mg total) by mouth 2 (two) times daily with a meal. 10/13/21   Mariel Aloe, MD  glucose blood test strip Use to check fasting blood sugar once daily. 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 Insulin dependent 11/29/20   [name], MD  hydrALAZINE (APRESOLINE) 25 MG tablet Take 1 tablet (25 mg total) by mouth 3 (three) times daily. 10/13/21 11/12/21  Mariel Aloe, MD  insulin aspart (NOVOLOG FLEXPEN) 100 UNIT/ML FlexPen Inject 5 Units into the skin 2 (two) times daily with a meal. Patient not taking: Reported on 09/28/2021 02/26/21   [name], [name], MD  insulin glargine (LANTUS) 100 UNIT/ML Solostar Pen Inject 10 Units into the skin daily. May substitute with any other long-acting formulary. Patient not taking: Reported on 09/28/2021 05/13/21   [name], MD  Insulin Pen Needle 32G X 4 MM MISC Use with insulin  pens 02/26/21   [name], [name], MD  RENVELA 800 MG tablet Take 800-1,600 mg by mouth See admin instructions. Take 2 tablets (1600 mg) three times a day with meals and 1 tablet (800 mg) twice a day as needed with snacks 10/25/21   [provider]  sertraline (ZOLOFT) 50 MG tablet Take 1 tablet (50 mg total) by mouth daily. Patient not taking: Reported on 07/06/2021 1/74/08  1/44/81  Delora Fuel, MD  TRUEplus Lancets 28G MISC USE TO MEASURE BLOOD SUGAR TWICE A DAY 11/29/20 11/29/21  [name], MD  gabapentin (NEURONTIN) 300 MG capsule Take 1 capsule (300 mg total) by mouth 3 (three) times daily. Patient not taking: Reported on 07/27/2019 07/29/18 08/30/19  [name], FNP  insulin detemir (LEVEMIR FLEXTOUCH) 100 UNIT/ML FlexPen Inject 35 Units into the skin daily. Patient not taking: Reported on 02/17/2021 11/29/20 02/24/21  [name], MD  sildenafil (VIAGRA) 25 MG tablet Take 1 tablet (25 mg total) by mouth as needed for erectile dysfunction. for erectile dysfunction. Patient not taking: Reported on 08/11/2020 10/13/19 11/27/20  [name], FNP  sitaGLIPtin (JANUVIA) 25 MG tablet Take 1 tablet (25 mg total) by mouth daily. Patient not taking: Reported on 08/11/2020 02/22/20 11/27/20  [name], MD     Family History  Problem Relation Age of Onset   Hypertension Mother 82   Cancer Mother        bladder cancer   Alcohol abuse Father    Hypertension Brother     Social History   Socioeconomic History   Marital status: Legally Separated    Spouse name: Not on file   Number of children: Not on file   Years of education: Not on file   Highest education level: Not on file  Occupational History   Occupation: cook    Comment: lost job   Occupation: Biochemist, clinical    Comment: Updated June 2013   Occupation: [place]: Began Jan 2014  Tobacco Use   Smoking status: Never   Smokeless tobacco: Never  Vaping Use   Vaping Use: Never used  Substance and Sexual Activity   Alcohol use: No    Alcohol/week: 0.0 standard drinks of alcohol   Drug use: No   Sexual activity: Yes    Partners: Female  Other Topics Concern   Not on file  Social History Narrative   Lives with his mother and brother.   Wife has had a foot amputation; is a type 1 DM.   Social Determinants of Health   Financial Resource Strain: Not on  file  Food Insecurity: Not on file  Transportation Needs: Not on file  Physical Activity: Not on file  Stress: Not on file  Social Connections: Not on file    Vital Signs: BP 101/65 (BP Location: Left Arm)   Pulse 60   Temp 98.8 F (37.1 C) (Oral)   Resp 16   Wt 163 lb 12.8 oz (74.3 kg)   SpO2 97%   BMI 25.66 kg/m   Physical Exam Constitutional:      General: He is not in acute distress.    Appearance: He is not toxic-appearing.  HENT:     Head: Normocephalic and atraumatic.     Mouth/Throat:     Pharynx: Oropharynx is clear.  Eyes:     Extraocular Movements: Extraocular movements intact.  Cardiovascular:     Rate and Rhythm: Normal rate.     Pulses: Normal  pulses.  Pulmonary:     Effort: Pulmonary effort is normal.  Abdominal:     General: Abdomen is flat.     Palpations: Abdomen is soft.  Musculoskeletal:     Right lower leg: Edema present.     Left lower leg: Edema present.  Skin:    General: Skin is dry.  Neurological:     General: No focal deficit present.     Mental Status: He is alert.  Psychiatric:        Behavior: Behavior normal.        Judgment: Judgment normal.     Imaging: CT ABDOMEN PELVIS WO CONTRAST  Result Date: 11/05/2021 CLINICAL DATA:  Nausea, vomiting, history hypertension, type II diabetes mellitus EXAM: CT ABDOMEN AND PELVIS WITHOUT CONTRAST TECHNIQUE: Multidetector CT imaging of the abdomen and pelvis was performed following the standard protocol without IV contrast. RADIATION DOSE REDUCTION: This exam was performed according to the departmental dose-optimization program which includes automated exposure control, adjustment of the mA and/or kV according to patient size and/or use of iterative reconstruction technique. COMPARISON:  10/21/2011 FINDINGS: Lower chest: Bibasilar pulmonary infiltrates consistent with multifocal pneumonia. Small BILATERAL pleural effusions larger on RIGHT. Hepatobiliary: Gallbladder and liver normal appearance  Pancreas: Normal appearance Spleen: Normal appearance Adrenals/Urinary Tract: Adrenal glands, kidneys, ureters, and bladder normal appearance Stomach/Bowel: Normal appendix. Scattered colonic diverticulosis. Stomach unremarkable. Within limits of exam lacking IV and oral contrast, no gross abnormalities of the bowel loops are identified. Vascular/Lymphatic: Aorta normal caliber.  No adenopathy. Reproductive: Mild prostatic enlargement Other: Scattered free fluid and soft tissue edema. No free air. No hernia. Musculoskeletal: No acute osseous findings. IMPRESSION: Bibasilar pulmonary infiltrates consistent with multifocal pneumonia. Small BILATERAL pleural effusions larger on RIGHT. Scattered colonic diverticulosis without evidence of diverticulitis. Mild prostatic enlargement. Scattered soft tissue edema and small volume ascites. Electronically Signed   By: Lavonia Dana M.D.   On: 11/05/2021 13:54   DG Chest Portable 1 View  Result Date: 11/05/2021 CLINICAL DATA:  vomiting, missed dialysis EXAM: PORTABLE CHEST 1 VIEW COMPARISON:  10/13/2021 chest radiograph. FINDINGS: Stable cardiomediastinal silhouette with mild cardiomegaly. No pneumothorax. Small left pleural effusion. No right pleural effusion. Extensive patchy and fluffy opacities throughout the lungs bilaterally, predominantly in the lower parahilar lung zones. IMPRESSION: 1. Mild cardiomegaly. Extensive patchy and fluffy opacities throughout the lungs bilaterally, predominantly in the lower parahilar lung zones, favor acute moderate to severe pulmonary edema. Chest radiograph follow-up advised. 2. Small left pleural effusion. Electronically Signed   By: Ilona Sorrel M.D.   On: 11/05/2021 11:26   DG Chest Portable 1 View  Result Date: 10/13/2021 CLINICAL DATA:  Shortness of breath, chest pain EXAM: PORTABLE CHEST 1 VIEW COMPARISON:  10/10/2018 FINDINGS: Cardiomegaly. Vascular congestion and diffuse bilateral airspace disease, likely edema. No effusions  or acute bony abnormality. IMPRESSION: Cardiomegaly, moderate CHF. Electronically Signed   By: Rolm Baptise M.D.   On: 10/13/2021 03:26   CT Chest W Contrast  Result Date: 10/10/2021 CLINICAL DATA:  Shortness of breath nodules beneath the nipple chest pain EXAM: CT CHEST WITH CONTRAST TECHNIQUE: Multidetector CT imaging of the chest was performed during intravenous contrast administration. RADIATION DOSE REDUCTION: This exam was performed according to the departmental dose-optimization program which includes automated exposure control, adjustment of the mA and/or kV according to patient size and/or use of iterative reconstruction technique. CONTRAST:  44mL OMNIPAQUE IOHEXOL 300 MG/ML  SOLN COMPARISON:  Chest x-ray 10/09/2021, 09/20/2021, 10/04/2021 FINDINGS: Cardiovascular: Nonaneurysmal aorta. Cardiomegaly. Small pericardial  effusion Mediastinum/Nodes: Midline trachea. No suspicious thyroid mass. Borderline prevascular node measuring 10 mm. Small right hilar nodes measuring 10 mm. Esophagus within normal limits Lungs/Pleura: No pneumothorax. Trace right-sided pleural effusion. Mild right middle lobe bronchiectasis with bronchial wall thickening and streaky consolidations. Similar finding at the lingula to a lesser extent. Upper Abdomen: No acute abnormality. Musculoskeletal: Generalized subcutaneous edema. No acute osseous abnormality. Mild bilateral gynecomastia IMPRESSION: 1. Cardiomegaly with small pericardial effusion and trace right pleural effusion 2. Mild right middle lobe bronchiectasis with mild bronchial wall thickening and streaky airspace disease at the right middle lobe, suspect for pneumonia residual. Short interval CT follow-up to resolution recommended 3. Borderline mediastinal and right hilar nodes likely reactive 4. Mild bilateral gynecomastia which may account for the nodules beneath the nipples described in the history Electronically Signed   By: Donavan Foil M.D.   On: 10/10/2021 00:05    DG Chest 2 View  Result Date: 10/09/2021 CLINICAL DATA:  Shortness of breath, chest pain EXAM: CHEST - 2 VIEW COMPARISON:  Multiple priors, most recently 10/09/2021 at 0155 hours FINDINGS: Patchy right lower lobe/infrahilar opacities, unchanged from recent priors, improved from May 12. Mild linear opacity in the lingula, likely atelectasis. No pleural effusion or pneumothorax. Cardiomegaly.  No frank interstitial edema. IMPRESSION: Patchy right lower lobe/infrahilar opacities, unchanged from recent priors, improved from May 12. Electronically Signed   By: Julian Hy M.D.   On: 10/09/2021 18:30   DG Chest 2 View  Result Date: 10/09/2021 CLINICAL DATA:  Dyspnea. EXAM: CHEST - 2 VIEW COMPARISON:  10/04/2021. FINDINGS: The heart is markedly enlarged the mediastinal contour stable. The pulmonary vasculature is stable. Stable airspace opacities are noted in the infrahilar region on the right. No consolidation, effusion, or pneumothorax. No acute osseous abnormality. IMPRESSION: Airspace opacities in the infrahilar region on the right, unchanged from the prior exam. Electronically Signed   By: Brett Fairy M.D.   On: 10/09/2021 02:20    Labs:  CBC: Recent Labs    10/12/21 0328 10/13/21 0027 11/05/21 1208 11/05/21 1227 11/06/21 0328  WBC 6.4 6.2 13.7*  --  11.8*  HGB 9.8* 10.0* 11.9* 11.2* 9.1*  HCT 32.4* 32.6* 37.1* 33.0* 28.1*  PLT 386 413* 404*  --  305    COAGS: No results for input(s): "INR", "APTT" in the last 8760 hours.  BMP: Recent Labs    10/12/21 0328 10/13/21 0027 11/05/21 1208 11/05/21 1227 11/06/21 0328  NA 136 140 142 138 137  K 3.5 4.0 4.5 4.1 3.9  CL 100 101 102  --  99  CO2 24 25 21*  --  24  GLUCOSE 122* 179* 225*  --  210*  BUN 33* 40* 75*  --  51*  CALCIUM 7.9* 7.8* 7.7*  --  7.3*  CREATININE 6.78* 7.60* 11.39*  --  8.31*  GFRNONAA 9* 8* 5*  --  7*    LIVER FUNCTION TESTS: Recent Labs    10/04/21 0052 10/09/21 0154 10/12/21 0328  11/05/21 1208  BILITOT 0.6 0.8 0.4 1.0  AST 38 83* 43* 44*  ALT 35 76* 67* 60*  ALKPHOS 296* 355* 336* 439*  PROT 7.0 7.0 6.8 7.5  ALBUMIN 2.3* 2.4* 2.4* 2.5*    Assessment and Plan:  ESRD on dialysis --malfunctioning AV fistula with high pressures --no current complaints --for fistulogram today --last meal at 0800, will plan for later this afternoon   Thank you for this interesting consult.  I greatly enjoyed [place]  and look forward to participating in their care.  A copy of this report was sent to the requesting provider on this date.  Electronically Signed: Pasty Spillers, PA 11/06/2021, 9:17 AM   I spent a total of 20 Minutes in face to face in clinical consultation, greater than 50% of which was counseling/coordinating care for fistulogram.

## 2021-11-06 NOTE — Assessment & Plan Note (Signed)
Volume overload due to ESRD non compliant with HD  11/2020 Echocardiogram with reduced EF 30 to 35% with global hypokinesis, severe concentric LVH, RV function preserved. No significant valvular disease.   Continue fluid removal per ultrafiltration Continue with carvedilol.

## 2021-11-06 NOTE — TOC Initial Note (Addendum)
Transition of Care (TOC) - Initial/Assessment Note    Patient Details  Name: [name] MRN: [id number] Date of Birth: 03/22/1971  Transition of Care (TOC) CM/SW Contact:    Cyrus P Kolar, LCSW Phone Number: 11/06/2021, 1:06 PM  Clinical Narrative:                  TOC consult was placed for Homeless issues. CSW met with pt bedside. Pt remains with his face covered by sheets but engages in conversation with CSW. Pt provides some history regarding living situation though timeline is not completely clear. Pt indicates that he was living with his mom until the last 1-2 weeks when she died. Pt was then homeless for a few nights and then [facility] assisted pt in getting into a shelter in [place]. Pt states he was at the shelter for 4 days and then left due to not liking the staff there. He has since been unsheltered since. He does seem to be be familiar with daytime services at the [facility]. CSW explains shelter options though pt states he does not want to go into any shelter. Pt explains he wants permanent housing or to be provided with a hotel room. CSW explains the hospital would not be able to provide either of these. CSW explained there are community agencies that pt could obtain a caseworker to assist with long-term housing though that this would be a longer term process. Pt expresses interest in  [facility] ([facility] hotel program; he explains he had at some point spoken with [facility] staff and states they told him to call on 10/18/21 but that he did not do this. CSW called [facility] on speaker phone and was transferred to voicemail to get on waiting list. CSW provided pt name and phone number as well as CSW's contact info in voicemail. CSW encouraged pt to call regularly to confirm he is on wait list. Pt states, "so I will just be going back to the street?" CSW reiterated that he could assist with finding a shelter but that he would be unable to provide pt with permanent housing or a  hotel. Pt does indicate that he has a lawyer who is helping him apply for disability. He is also familiar with [facility] resources.  CSW inquired about transportation issues. Pt states he does not have transportation issues. He reports using bus or medicaid transport to get to OP HD. CSW inquired about missed HD appointments. Pt is vague about reasons why he missed HD appointments and states it was because of "personal reasons."   CSW left room and returned briefly after to provide housing/homeless resource list. CSW highlighting [facility] and [facility] as places that could help with more long term housing.   There is also a TOC consult for medication assistance. Pt has medicaid and medications are already covered with copay of 4$.   Expected Discharge Plan: Homeless Shelter Barriers to Discharge: Continued Medical Work up   Patient Goals and CMS Choice        Expected Discharge Plan and Services Expected Discharge Plan: Homeless Shelter       Living arrangements for the past 2 months: Homeless                                      Prior Living Arrangements/Services Living arrangements for the past 2 months: Homeless Lives with:: Self                     Activities of Daily Living Home Assistive Devices/Equipment: None ADL Screening (condition at time of admission) Patient's cognitive ability adequate to safely complete daily activities?: Yes Is the patient deaf or have difficulty hearing?: No Does the patient have difficulty seeing, even when wearing glasses/contacts?: No Does the patient have difficulty concentrating, remembering, or making decisions?: No Patient able to express need for assistance with ADLs?: Yes Does the patient have difficulty dressing or bathing?: No Independently performs ADLs?: Yes (appropriate for developmental age) Does the patient have difficulty walking or climbing stairs?: No Weakness of Legs: None Weakness of Arms/Hands:  None  Permission Sought/Granted                  Emotional Assessment Appearance:: Other (Comment Required (Under Blanket) Attitude/Demeanor/Rapport: Avoidant Affect (typically observed): Flat Orientation: : Oriented to Self, Oriented to Place, Oriented to  Time, Oriented to Situation Alcohol / Substance Use: Not Applicable Psych Involvement: No (comment)  Admission diagnosis:  CHF (congestive heart failure) (HCC) [I50.9] Acute respiratory failure with hypoxia (HCC) [J96.01] Patient Active Problem List   Diagnosis Date Noted   CHF (congestive heart failure) (HCC) 11/05/2021   Homelessness 10/13/2021   Chronic respiratory failure with hypoxia, on home O2 therapy (HCC) - 2 L/min 10/13/2021   Acute pulmonary edema (HCC) 09/29/2021   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) 09/28/2021   Acute respiratory failure with hypoxemia (HCC) 09/28/2021   Volume overload 04/12/2021   Mild protein-calorie malnutrition (HCC) 03/06/2021   Aftercare including intermittent dialysis (HCC) 02/27/2021   Allergy, unspecified, initial encounter 02/27/2021   Anemia due to chronic kidney disease 02/27/2021   ESRD on dialysis (HCC) 02/27/2021   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   Secondary hyperparathyroidism of renal origin (HCC) 02/27/2021   Shortness of breath 02/27/2021   Type 2 diabetes mellitus with other diabetic kidney complication (HCC) 02/27/2021   Hypertensive urgency 02/18/2021   Type 2 diabetes mellitus (HCC) 02/17/2021   Acute on chronic combined systolic and diastolic CHF (congestive heart failure) (HCC) 11/29/2020   Hypertensive crisis 11/27/2020   Lower extremity edema 03/07/2020   Stage 4 chronic kidney disease (HCC) 02/22/2020   Left ventricular dysfunction 04/27/2019   Hemoglobin A1C greater than 9%, indicating poor diabetic control 07/29/2018   LBBB (left bundle branch block)    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, [address] [place] [address] Phone: [phone] Fax: [phone]     Social Determinants of Health (SDOH) Interventions    Readmission Risk Interventions    02/19/2021    3:52 PM  Readmission Risk Prevention Plan  Transportation Screening Complete  Medication Review (RN Care Manager) Complete  PCP or Specialist appointment within 3-5 days of discharge Complete  HRI or Home Care Consult Complete  SW Recovery Care/Counseling Consult Complete  Palliative Care Screening Not Applicable  Skilled Nursing Facility Not Applicable     

## 2021-11-07 ENCOUNTER — Encounter (HOSPITAL_COMMUNITY): Payer: Self-pay | Admitting: Internal Medicine

## 2021-11-07 DIAGNOSIS — I5043 Acute on chronic combined systolic (congestive) and diastolic (congestive) heart failure: Secondary | ICD-10-CM | POA: Diagnosis not present

## 2021-11-07 DIAGNOSIS — E1122 Type 2 diabetes mellitus with diabetic chronic kidney disease: Secondary | ICD-10-CM

## 2021-11-07 DIAGNOSIS — N186 End stage renal disease: Secondary | ICD-10-CM | POA: Diagnosis not present

## 2021-11-07 DIAGNOSIS — Z5900 Homelessness unspecified: Secondary | ICD-10-CM

## 2021-11-07 DIAGNOSIS — J9601 Acute respiratory failure with hypoxia: Secondary | ICD-10-CM | POA: Diagnosis not present

## 2021-11-07 DIAGNOSIS — R112 Nausea with vomiting, unspecified: Secondary | ICD-10-CM

## 2021-11-07 DIAGNOSIS — Z992 Dependence on renal dialysis: Secondary | ICD-10-CM

## 2021-11-07 DIAGNOSIS — J690 Pneumonitis due to inhalation of food and vomit: Secondary | ICD-10-CM

## 2021-11-07 DIAGNOSIS — R197 Diarrhea, unspecified: Secondary | ICD-10-CM

## 2021-11-07 LAB — BASIC METABOLIC PANEL
Anion gap: 14 (ref 5–15)
BUN: 63 mg/dL — ABNORMAL HIGH (ref 6–20)
CO2: 21 mmol/L — ABNORMAL LOW (ref 22–32)
Calcium: 7.7 mg/dL — ABNORMAL LOW (ref 8.9–10.3)
Chloride: 100 mmol/L (ref 98–111)
Creatinine, Ser: 10.24 mg/dL — ABNORMAL HIGH (ref 0.61–1.24)
GFR, Estimated: 6 mL/min — ABNORMAL LOW (ref >60)
Glucose, Bld: 163 mg/dL — ABNORMAL HIGH (ref 70–99)
Potassium: 4.8 mmol/L (ref 3.5–5.1)
Sodium: 135 mmol/L (ref 135–145)

## 2021-11-07 LAB — CBC
HCT: 30.9 % — ABNORMAL LOW (ref 39.0–52.0)
Hemoglobin: 10.2 g/dL — ABNORMAL LOW (ref 13.0–17.0)
MCH: 29.3 pg (ref 26.0–34.0)
MCHC: 33 g/dL (ref 30.0–36.0)
MCV: 88.8 fL (ref 80.0–100.0)
Platelets: 300 10*3/uL (ref 150–400)
RBC: 3.48 MIL/uL — ABNORMAL LOW (ref 4.22–5.81)
RDW: 16.8 % — ABNORMAL HIGH (ref 11.5–15.5)
WBC: 8.1 10*3/uL (ref 4.0–10.5)
nRBC: 0.4 % — ABNORMAL HIGH (ref 0.0–0.2)

## 2021-11-07 LAB — GLUCOSE, CAPILLARY
Glucose-Capillary: 168 mg/dL — ABNORMAL HIGH (ref 70–99)
Glucose-Capillary: 174 mg/dL — ABNORMAL HIGH (ref 70–99)
Glucose-Capillary: 168 mg/dL — ABNORMAL HIGH (ref 70–99)
Glucose-Capillary: 162 mg/dL — ABNORMAL HIGH (ref 70–99)

## 2021-11-07 LAB — HEPATITIS B SURFACE ANTIBODY, QUANTITATIVE: Hep B S AB Quant (Post): <3.1 m[IU]/mL — ABNORMAL LOW (ref >9.9)

## 2021-11-07 MED ORDER — CHLORHEXIDINE GLUCONATE CLOTH 2 % EX PADS: 6.0000 | MEDICATED_PAD | Freq: Every day | CUTANEOUS | Status: DC

## 2021-11-07 MED ORDER — AMOXICILLIN-POT CLAVULANATE 500-125 MG PO TABS
1.0000 | ORAL_TABLET | ORAL | Status: DC
  Filled 2021-11-07: qty 1

## 2021-11-07 NOTE — Progress Notes (Signed)
Pharmacy Antibiotic Note  [name] is a 51 y.o. male admitted on 11/05/2021 presenting with asp pna.  Pt has been on Unasyn 3gm IV q12h and now to transition to Augmentin.  Plan: Augmentin 500mg  q24h (take dose in evenings) Pharmacy will sign off - please reconsult if needed  Weight: 74.8 kg (164 lb 14.5 oz)  Temp (24hrs), Avg:98.5 F (36.9 C), Min:98.1 F (36.7 C), Max:98.8 F (37.1 C)  Recent Labs  Lab 11/05/21 1208 11/06/21 0328 11/07/21 0755  WBC 13.7* 11.8* 8.1  CREATININE 11.39* 8.31* 10.24*  LATICACIDVEN 2.3*  --   --      Estimated Creatinine Clearance: 8.2 mL/min (A) (by C-G formula based on SCr of 10.24 mg/dL (H)).    Allergies  Allergen Reactions   Sulfa Antibiotics Rash    Severe rash.    Keflex [Cephalexin] Other (See Comments)    Patient with severe drug reaction including exfoliating skin rash and hypotension after being prescribed both cephalexin and sulfamethoxazole-trimethoprim simultaneously. Favor SMX as much more likely culprit as patient had tolerated other cephalosporins in the past, but cannot say with complete certainty that this was not related to cephalexin.    Other Other (See Comments)    Patient states he is allergic to the TB test - unknown reaction   Bactrim [Sulfamethoxazole-Trimethoprim] Rash    Sherlon Handing, PharmD, BCPS Please see amion for complete clinical pharmacist phone list 11/07/2021 6:27 PM

## 2021-11-07 NOTE — Progress Notes (Signed)
Pt refused to take his augmentin, Dr. Bridgett Larsson notified.

## 2021-11-07 NOTE — Progress Notes (Signed)
Patient refused telemetry monitor, Dr. Bridgett Larsson made aware.

## 2021-11-07 NOTE — TOC Progression Note (Signed)
Transition of Care [facility]) - Progression Note    Patient Details  Name: [name] MRN: [id number] Date of Birth: Jan 11, 1971  Transition of Care [facility]) CM/SW Contact  Zenon Mayo, RN Phone Number: 11/07/2021, 4:23 PM  Clinical Narrative:    NCM spoke with patient at the bedside, he is homeless, states his mom passed last week and he was living with her.  He states he [facility] in [facility] but he did not like the way the people were treating him there so he left.  He would like to get into a shelter here in [place].  NCM left message with [facility] Case Manager to return call.  He states his phone has been turned off also and it will cost 41.00 to get it turned back on.  He states he uses Medicaid Transport to get to HD apt. He states a cab voucher would help him and some bus passes, he needs to go to the bus depot to pick up his  mother's w/chair.  He states he will have to pawn the w/chair to get money to pay for his phone and then go back to get the w/chair.  He states he [facility] for [facility].  NCM informed him that he may possibly be dc tomorrow.  TOC will continue to follow for dc needs.    Expected Discharge Plan: Homeless Shelter Barriers to Discharge: Continued Medical Work up  Expected Discharge Plan and Services Expected Discharge Plan: [place]   Discharge Planning Services: CM Consult   Living arrangements for the past 2 months: Homeless Shelter                   DME Agency: NA       HH Arranged: NA           Social Determinants of Health (SDOH) Interventions    Readmission Risk Interventions    11/07/2021    4:21 PM 02/19/2021    3:52 PM  Readmission Risk Prevention Plan  Transportation Screening Complete Complete  Medication Review Press photographer) Complete Complete  PCP or Specialist appointment within 3-5 days of discharge Complete Complete  HRI or [place] Complete Complete  SW Recovery  Care/Counseling Consult Complete Complete  Palliative Care Screening Not Applicable Not [place] Not Applicable Not Applicable

## 2021-11-07 NOTE — Assessment & Plan Note (Signed)
--  secondary to volume overload, now resolved

## 2021-11-07 NOTE — Progress Notes (Signed)
TRH night cross cover note:   I was notified by RN that the patient is refusing scheduled evening doses of multiple medications that include heparin, Renvela, ampicillin, Mucinex.  Per RN, the patient had also refused several scheduled medications during dayshift and that day hospitalist was made aware at that time.     Babs Bertin, DO Hospitalist

## 2021-11-07 NOTE — Progress Notes (Addendum)
Contacted [facility] and spoke to Agricultural consultant. In the event pt agreeable to HD today, inquired if pt could receive treatment at clinic instead of inpt. Clinic is unable to treat pt today but pt is on the schedule for regular treatment tomorrow should pt be d/c today. Will assist as needed.   Melven Sartorius Renal Navigator [phone]  Addendum at 3:56 pm: Contacted TOC staff to inquire about possible d/c date. Attending to see pt and make determination. Advised clinic to please keep pt on schedule for tomorrow in the event pt d/c today or early tomorrow am.

## 2021-11-07 NOTE — Assessment & Plan Note (Signed)
--  n/v resolved --diarrhea a side effect of antibiotic

## 2021-11-07 NOTE — Assessment & Plan Note (Signed)
--  Multifactorial including pulmonary edema, volume overload, aspiration pneumonia.  SPO2 recorded by EDP is 58% on room air. -- Subjectively improved.  Wean oxygen as tolerated.  Continue HD and treatment for pneumonia.

## 2021-11-07 NOTE — Progress Notes (Signed)
Patient refused weight this am. He does not want to go to dialysis before breakfast. Explained to him that there may not be a slot for him latter today. He said "I can go to the clinic tomorrow".

## 2021-11-07 NOTE — Progress Notes (Signed)
  Progress Note   Patient: [name] LOV:[id number] DOB: [birth year] DOA: 11/05/2021     2 DOS: the patient was seen and examined on 11/07/2021   Brief hospital course: 51 year old man PMH ESRD presented with shortness of breath and vomiting secondary to missed dialysis and uremia.  Admitted for volume overload and acute hypoxic respiratory failure. --6/21 feels improved status post dialysis, still on oxygen which is new, breathing better.  Assessment and Plan: * Acute respiratory failure with hypoxia (HCC) --Multifactorial including pulmonary edema, volume overload, aspiration pneumonia.  SPO2 recorded by EDP is 58% on room air. -- Subjectively improved.  Wean oxygen as tolerated.  Continue HD and treatment for pneumonia.  ESRD (end stage renal disease) ([place]) --With associated volume overload secondary to missed HD.  Feels better status post HD today.  Next session tomorrow.  Aspiration pneumonia ([place]) --With associated acute hypoxic respiratory failure.  Changed to oral antibiotics.  Wean oxygen.  Acute on chronic combined systolic and diastolic CHF (congestive heart failure) ([place]) -- Secondary to volume overload from missed HD.  11/2020 Echocardiogram with reduced EF 30 to 35% -- Continue carvedilol.  Management per nephrology with fluid removal.  Nausea vomiting and diarrhea --n/v resolved --diarrhea a side effect of antibiotic  T2DM (type 2 diabetes mellitus) (HCC) --CBG stable, continue SSI  Homelessness --appreciate TOC  Hypertensive crisis --secondary to volume overload, now resolved  Essential hypertension Continue blood pressure control with carvedilol.   Likely home tomorrow     Subjective:  Feels better Breathing better   Physical Exam: Vitals:   11/07/21 1600 11/07/21 1603 11/07/21 1630 11/07/21 1700  BP: (!) 173/91 (!) 173/93 (!) 159/61 138/71  Pulse:  75 76   Resp: 11 (!) 26 19 16   Temp:   98.8 F (37.1 C)   TempSrc:   Oral   SpO2:  98% 98%    Weight:   74.8 kg    Physical Exam Vitals reviewed.  Constitutional:      General: He is not in acute distress.    Appearance: He is not ill-appearing or toxic-appearing.  Cardiovascular:     Rate and Rhythm: Normal rate and regular rhythm.     Heart sounds: No murmur heard. Pulmonary:     Effort: Pulmonary effort is normal. No respiratory distress.     Breath sounds: No wheezing, rhonchi or rales.  Musculoskeletal:     Right lower leg: No edema.     Left lower leg: No edema.  Neurological:     Mental Status: He is alert.  Psychiatric:        Mood and Affect: Mood normal.        Behavior: Behavior normal.     Data Reviewed:  CBG stable K+ WNL Hgb 10.2  Family Communication: none  Disposition: Status is: Inpatient Remains inpatient appropriate because: acute hypoxia  Planned Discharge Destination: Home    Time spent: 35 minutes  Author: Murray Hodgkins, MD 11/07/2021 6:24 PM  For on call review [email or web address].

## 2021-11-07 NOTE — Progress Notes (Signed)
Patient signed early termination of his hemodialysis treatment.

## 2021-11-07 NOTE — Assessment & Plan Note (Signed)
--  appreciate TOC

## 2021-11-07 NOTE — Progress Notes (Addendum)
[facility] Progress Note   Subjective: Refusing medications, refused to come to HD this AM. Missed HD yesterday D/T scheduling issues. Refused 1st call to HD today because he's hungry and wanted breakfast. Wants to go home for his mother's funeral. ABX giving him diarrhea per pt report.   Objective Vitals:   11/06/21 1553 11/06/21 2257 11/07/21 0027 11/07/21 0519  BP: 104/67 135/76  90/63  Pulse: (!) 55 64 66 (!) 58  Resp: 14 14 14 14   Temp:  98.5 F (36.9 C)    TempSrc:  Oral Oral   SpO2: 98%   94%  Weight:       General: Chronically ill appearing male in no acute distress. Head: Normocephalic, atraumatic, sclera non-icteric, mucus membranes are moist Neck: Supple. No JVD Lungs: CTAB.  No WOB.  Heart: RRR with S1 S2. No M/R/G. SR on monitor.  Abdomen: NABS, NT, ND Lower extremities: trace 1+ BLE pitting edema.  Neuro: Alert and oriented X 3. Moves all extremities spontaneously. Psych:  Responds to questions appropriately with a normal affect. Dialysis Access: R AVG + T/B      Additional Objective Labs: Basic Metabolic Panel: Recent Labs  Lab 11/05/21 1208 11/05/21 1227 11/06/21 0328  NA 142 138 137  K 4.5 4.1 3.9  CL 102  --  99  CO2 21*  --  24  GLUCOSE 225*  --  210*  BUN 75*  --  51*  CREATININE 11.39*  --  8.31*  CALCIUM 7.7*  --  7.3*  PHOS  --   --  6.4*   Liver Function Tests: Recent Labs  Lab 11/05/21 1208 11/06/21 0328  AST 44*  --   ALT 60*  --   ALKPHOS 439*  --   BILITOT 1.0  --   PROT 7.5  --   ALBUMIN 2.5* 1.8*   Recent Labs  Lab 11/05/21 1208  LIPASE 34   CBC: Recent Labs  Lab 11/05/21 1208 11/05/21 1227 11/06/21 0328 11/07/21 0755  WBC 13.7*  --  11.8* 8.1  NEUTROABS 11.9*  --   --   --   HGB 11.9* 11.2* 9.1* 10.2*  HCT 37.1* 33.0* 28.1* 30.9*  MCV 90.3  --  88.4 88.8  PLT 404*  --  305 300   Blood Culture    Component Value Date/Time   SDES BLOOD LEFT ANTECUBITAL 11/05/2021 1208   SPECREQUEST   11/05/2021 1208    BOTTLES DRAWN AEROBIC AND ANAEROBIC Blood Culture results may not be optimal due to an inadequate volume of blood received in culture bottles   CULT  11/05/2021 1208    NO GROWTH 2 DAYS Performed at [facility], [place] [address] [place], [place] [address]    REPTSTATUS PENDING 11/05/2021 1208    Cardiac Enzymes: No results for input(s): "CKTOTAL", "CKMB", "CKMBINDEX", "TROPONINI" in the last 168 hours. CBG: Recent Labs  Lab 11/06/21 1304 11/06/21 1549 11/06/21 2129 11/07/21 0609 11/07/21 0657  GLUCAP 165* 112* 257* 168* 174*   Iron Studies: No results for input(s): "IRON", "TIBC", "TRANSFERRIN", "FERRITIN" in the last 72 hours. @lablastinr3 @ Studies/Results: IR AV DIALY SHUNT INTRO NEEDLE/INTRACATH INITIAL W/PTA/IMG RIGHT  Result Date: 11/06/2021 INDICATION: 51 year old gentleman with right upper extremity loop AV dialysis graft presents to interventional radiology for fistulagram given increased venous pressures. EXAM: Right upper extremity loop AV dialysis graft fistulagram with right brachial venoplasty. MEDICATIONS: None. ANESTHESIA/SEDATION: Moderate (conscious) sedation was employed during this procedure. A total of Versed 0.5 mg  and Fentanyl 25 mcg was administered intravenously by the radiology nurse. Total intra-service moderate Sedation Time: 49 minutes. The patient's level of consciousness and vital signs were monitored continuously by radiology nursing throughout the procedure under my direct supervision. FLUOROSCOPY: Radiation Exposure Index (as provided by the fluoroscopic device): 10 mGy Kerma COMPLICATIONS: None immediate. PROCEDURE: Informed written consent was obtained from the patient after a thorough discussion of the procedural risks, benefits and alternatives. All questions were addressed. Maximal Sterile Barrier Technique was utilized including caps, mask, sterile gowns, sterile gloves, sterile drape, hand hygiene and skin antiseptic. A  timeout was performed prior to the initiation of the procedure. Patient positioned supine on the procedure table. The right arm skin prepped and draped in usual fashion. Ultrasound image documenting patency of the right upper extremity AV graft was obtained and placed in permanent medical record. Sterile ultrasound probe cover and gel utilized throughout the procedure. Utilizing continuous ultrasound guidance, the right upper extremity AV graft with a 21 gauge needle. 21 gauge needle exchanged for a transitional dilator set over 0.018 inch guidewire. Fistulagram performed through the 5 [place] dilator showed long segment hemodynamically significant moderate stenosis of the venous anastomosis and brachial vein extending from the elbow to the mid upper arm. No significant narrowing of the brachial vein in the proximal upper arm. No significant narrowing of the right axillary, subclavian, brachiocephalic veins and superior vena cava. No significant stenosis of the anastomosis identified on ultrasound. Given that the initial access was retrograde, it was removed and hemostasis achieved with manual compression. The right upper extremity AV graft was again accessed, this time in the antegrade direction with a 21 gauge needle utilizing continuous ultrasound guidance. Sterile ultrasound probe cover and gel utilized throughout the procedure. 21 gauge needle exchanged for transitional dilator set over 0.018 inch guidewire. Transitional dilator set exchanged for 5 French sheath over 0.035 inch guidewire. The sites of stenoses in the brachial vein were plastied with 7 mm mustang balloon. The overall caliber of the brachial vein significantly improved. Two foci of significant stenoses still remained at the venous anastomosis and in the brachial vein in the mid upper arm. The sites were plasty to again with high-pressure 7 mm con Quest balloon. The focal stenosis in the mid upper arm resolved, however the stenosis at the  anastomosis only partially resolved. Given the significant overall improved flow, I did not place a stent at this time. Access was removed and hemostasis achieved with pursestring suture. IMPRESSION: 1. Long segment Iva not significant moderate stenosis of the brachial vein in the distal half of the upper arm which was successfully plasty and with 7 mm balloon. 2. The focal stenosis at the venous anastomosis did not completely resolve. If the patient has continued increased venous pressures, repeat fistulagram should be performed and a stent should be placed at this site. ACCESS: This access remains amenable to future percutaneous interventions as clinically indicated. Electronically Signed   By: Miachel Roux M.D.   On: 11/06/2021 17:20   IR US Guide Vasc Access Right  Result Date: 11/06/2021 INDICATION: 51 year old gentleman with right upper extremity loop AV dialysis graft presents to interventional radiology for fistulagram given increased venous pressures. EXAM: Right upper extremity loop AV dialysis graft fistulagram with right brachial venoplasty. MEDICATIONS: None. ANESTHESIA/SEDATION: Moderate (conscious) sedation was employed during this procedure. A total of Versed 0.5 mg and Fentanyl 25 mcg was administered intravenously by the radiology nurse. Total intra-service moderate Sedation Time: 49 minutes. The patient's level of  consciousness and vital signs were monitored continuously by radiology nursing throughout the procedure under my direct supervision. FLUOROSCOPY: Radiation Exposure Index (as provided by the fluoroscopic device): 10 mGy Kerma COMPLICATIONS: None immediate. PROCEDURE: Informed written consent was obtained from the patient after a thorough discussion of the procedural risks, benefits and alternatives. All questions were addressed. Maximal Sterile Barrier Technique was utilized including caps, mask, sterile gowns, sterile gloves, sterile drape, hand hygiene and skin antiseptic. A  timeout was performed prior to the initiation of the procedure. Patient positioned supine on the procedure table. The right arm skin prepped and draped in usual fashion. Ultrasound image documenting patency of the right upper extremity AV graft was obtained and placed in permanent medical record. Sterile ultrasound probe cover and gel utilized throughout the procedure. Utilizing continuous ultrasound guidance, the right upper extremity AV graft with a 21 gauge needle. 21 gauge needle exchanged for a transitional dilator set over 0.018 inch guidewire. Fistulagram performed through the 5 [place] dilator showed long segment hemodynamically significant moderate stenosis of the venous anastomosis and brachial vein extending from the elbow to the mid upper arm. No significant narrowing of the brachial vein in the proximal upper arm. No significant narrowing of the right axillary, subclavian, brachiocephalic veins and superior vena cava. No significant stenosis of the anastomosis identified on ultrasound. Given that the initial access was retrograde, it was removed and hemostasis achieved with manual compression. The right upper extremity AV graft was again accessed, this time in the antegrade direction with a 21 gauge needle utilizing continuous ultrasound guidance. Sterile ultrasound probe cover and gel utilized throughout the procedure. 21 gauge needle exchanged for transitional dilator set over 0.018 inch guidewire. Transitional dilator set exchanged for 5 French sheath over 0.035 inch guidewire. The sites of stenoses in the brachial vein were plastied with 7 mm mustang balloon. The overall caliber of the brachial vein significantly improved. Two foci of significant stenoses still remained at the venous anastomosis and in the brachial vein in the mid upper arm. The sites were plasty to again with high-pressure 7 mm con Quest balloon. The focal stenosis in the mid upper arm resolved, however the stenosis at the  anastomosis only partially resolved. Given the significant overall improved flow, I did not place a stent at this time. Access was removed and hemostasis achieved with pursestring suture. IMPRESSION: 1. Long segment Iva not significant moderate stenosis of the brachial vein in the distal half of the upper arm which was successfully plasty and with 7 mm balloon. 2. The focal stenosis at the venous anastomosis did not completely resolve. If the patient has continued increased venous pressures, repeat fistulagram should be performed and a stent should be placed at this site. ACCESS: This access remains amenable to future percutaneous interventions as clinically indicated. Electronically Signed   By: Miachel Roux M.D.   On: 11/06/2021 17:20   CT ABDOMEN PELVIS WO CONTRAST  Result Date: 11/05/2021 CLINICAL DATA:  Nausea, vomiting, history hypertension, type II diabetes mellitus EXAM: CT ABDOMEN AND PELVIS WITHOUT CONTRAST TECHNIQUE: Multidetector CT imaging of the abdomen and pelvis was performed following the standard protocol without IV contrast. RADIATION DOSE REDUCTION: This exam was performed according to the departmental dose-optimization program which includes automated exposure control, adjustment of the mA and/or kV according to patient size and/or use of iterative reconstruction technique. COMPARISON:  10/21/2011 FINDINGS: Lower chest: Bibasilar pulmonary infiltrates consistent with multifocal pneumonia. Small BILATERAL pleural effusions larger on RIGHT. Hepatobiliary: Gallbladder and liver normal appearance  Pancreas: Normal appearance Spleen: Normal appearance Adrenals/Urinary Tract: Adrenal glands, kidneys, ureters, and bladder normal appearance Stomach/Bowel: Normal appendix. Scattered colonic diverticulosis. Stomach unremarkable. Within limits of exam lacking IV and oral contrast, no gross abnormalities of the bowel loops are identified. Vascular/Lymphatic: Aorta normal caliber.  No adenopathy.  Reproductive: Mild prostatic enlargement Other: Scattered free fluid and soft tissue edema. No free air. No hernia. Musculoskeletal: No acute osseous findings. IMPRESSION: Bibasilar pulmonary infiltrates consistent with multifocal pneumonia. Small BILATERAL pleural effusions larger on RIGHT. Scattered colonic diverticulosis without evidence of diverticulitis. Mild prostatic enlargement. Scattered soft tissue edema and small volume ascites. Electronically Signed   By: Lavonia Dana M.D.   On: 11/05/2021 13:54   DG Chest Portable 1 View  Result Date: 11/05/2021 CLINICAL DATA:  vomiting, missed dialysis EXAM: PORTABLE CHEST 1 VIEW COMPARISON:  10/13/2021 chest radiograph. FINDINGS: Stable cardiomediastinal silhouette with mild cardiomegaly. No pneumothorax. Small left pleural effusion. No right pleural effusion. Extensive patchy and fluffy opacities throughout the lungs bilaterally, predominantly in the lower parahilar lung zones. IMPRESSION: 1. Mild cardiomegaly. Extensive patchy and fluffy opacities throughout the lungs bilaterally, predominantly in the lower parahilar lung zones, favor acute moderate to severe pulmonary edema. Chest radiograph follow-up advised. 2. Small left pleural effusion. Electronically Signed   By: Ilona Sorrel M.D.   On: 11/05/2021 11:26   Medications:  sodium chloride     ampicillin-sulbactam (UNASYN) IV Stopped (11/06/21 1305)    calcitRIOL  0.75 mcg Oral Q T,Th,Sat-1800   calcium carbonate  1 tablet Oral TID   Chlorhexidine Gluconate Cloth  6 each Topical Q0600   guaiFENesin  1,200 mg Oral BID   heparin  5,000 Units Subcutaneous Q12H   insulin aspart  0-6 Units Subcutaneous TID WC   insulin aspart  5 Units Subcutaneous BID WC   sevelamer carbonate  1,600 mg Oral TID WC   And   sevelamer carbonate  800 mg Oral With snacks   sodium chloride flush  3 mL Intravenous Q12H     Dialysis Orders: Center: [facility] T,Th,S 4 hrs 180NRe 450/Autoflow 1.5 EDW 71 kg 2.0K/3.0 Ca  AVG -Heparin 2000 units IV TIW -Mircera 50 mcg IV q 2 weeks (last dose 11/01/2021) -Venofer 100 mg IV X 10 doses (1/10 doses given 11/01/2021) -Calcitriol 0.75 mcg PO TIW   Assessment/Plan:  Acute hypoxic respiratory failure in setting of missed HD. HD 11/05/2021 off schedule for volume removal. Net UF 2.8 L. Volume improved but still above OP EDW. Continue to lower volume as tolerated.   Possible aspiration PNA-afebrile. ABX per primary. WBC 8.1. [facility] 11/05/2021 NGTD.   N & V-no further nausea since he rec'd antiemetic. Per primary.   Hypotension: Present with hypertension, has been resumed on home medications. Now hypotensive. Amlodipine stopped.   ESRD -T,TH,S HD 06/19 off schedule. Missed HD 11/06/2021 D/T scheduling issues. HD today if he will agree to come.  AVG issues: Went for shuntogram in IR 11/06/2021 S/P PTA to brachial vein. Issue not completely resolved. May need further intervention.   Anemia  - HGB 10.2. on admission. Down to 9.4 today.  Recent OP ESA dose. Hold Fe load until infection ruled out.   Metabolic bone disease - Continue VDRA, binders. RFP added to today's labs.   Nutrition - Renal Carb mod diet. Nepro.   DMT2-per primary   Disposition: Only deterrent to discharge from nephrology standpoint is homeless issue. Otherwise can be discharged home particularly as he wishes to go to his mother's funeral.  Rita H. Brown NP-C 11/07/2021, 8:36 AM  [employer] [phone]   Seen and examined independently.  Agree with note and exam as documented above by physician extender and as noted here.  Had a fistulogram yesterday.  His treatment was moved to today due to an urgent patient need requiring reassigning staff.  He would like to be discharged because he and his brother are trying to plan for their mother's funeral.    General adult male in bed in no acute distress HEENT normocephalic atraumatic extraocular movements intact sclera anicteric Neck supple  trachea midline Lungs clear to auscultation bilaterally normal work of breathing at rest room iar Heart S1S2 no rub Abdomen soft nontender nondistended Extremities 2+ edema  Psych normal mood and affect RUE AVF bruit and thrill   Aspiration PNA  - abx per primary team - we are optimizing volume status with HD    N/v  - improved per pt   HTN emergency - now with hypotension compliance is doubtful if he has been homeless recently and now at a shelter.   - I discontinued his coreg for hypotension - May need back at a lower dose.  If discharged could do coreg 3.125 mg BID   ESRD  - HD today off schedule; normally on HD per TTS schedule  - s/p fistulogram   Anemia of CKD - no acute indication for ESA. Hb 88.3   Metabolic bone disease - on calcitriol with HD.  please do not include calcitriol on his discharge med list to avoid duplication.     Disposition per primary team.  Appreciate primary team.  He had previously been living at a homeless shelter out of town but wishes to be in the [place] area.  Appreciate SW with this difficult situation.  Spoke with HD SW and SW here has provided him with information and are helping how they can  Claudia Desanctis, MD 11/07/2021  11:33 AM

## 2021-11-07 NOTE — Progress Notes (Signed)
Patient refused PM meds (Heparin, Ampicillin, Renvela, and Mucinex). He thinks they may be causing diarrhea. Lomotil given to patient for diarrhea. Dr. Cathlean Sauer made aware of situation on day shift. Night time provider paged with situation.

## 2021-11-08 ENCOUNTER — Other Ambulatory Visit: Payer: Self-pay

## 2021-11-08 DIAGNOSIS — R112 Nausea with vomiting, unspecified: Secondary | ICD-10-CM

## 2021-11-08 DIAGNOSIS — R197 Diarrhea, unspecified: Secondary | ICD-10-CM

## 2021-11-08 DIAGNOSIS — E8770 Fluid overload, unspecified: Principal | ICD-10-CM

## 2021-11-08 MED ORDER — AMOXICILLIN-POT CLAVULANATE 500-125 MG PO TABS
1.0000 | ORAL_TABLET | ORAL | 0 refills | Status: DC
  Filled 2021-11-08: qty 4, 4d supply, fill #0

## 2021-11-08 NOTE — Progress Notes (Addendum)
Contacted [place] this morning and spoke to [place], Agricultural consultant. Clinic is expecting pt at noon for regular scheduled treatment. Nephrologist agreeable to plan and attending to d/c this am in order for pt to make 12:00 appt. TOC staff have assisted with pt clothing and cab to clinic. Pt also provided additional bus passes.   Melven Sartorius Renal Navigator [phone]

## 2021-11-08 NOTE — Progress Notes (Signed)
Patient pulled his PIV out, refused to change his bed linen, reports "I'm checking out today there's no need to change", also refused PIV reinsertion and weight.

## 2021-11-08 NOTE — Progress Notes (Signed)
Patient refused dialysis, CBG, and vital signs. MD notified.

## 2021-11-08 NOTE — TOC Progression Note (Addendum)
Transition of Care [facility]) - Progression Note    Patient Details  Name: [name] MRN: [id number] Date of Birth: 12/13/1970  Transition of Care [facility]) CM/SW [place], [place] Phone Number: 11/08/2021, 9:45 AM  Clinical Narrative:     CSW met with pt to discuss DC plans. TOC will arrange taxi for pt to get to his HD by 12pm and will provide additional bus passes. CSW offered to assist pt in arranging medicaid transportation for next few HD's; pt does not want assistance and states he will be able to arrange transport using wifi from his phone. CSW provide pt with a long sleeve shirt and pants from [place] closet. RN notified of plan.   Expected Discharge Plan: Homeless Shelter Barriers to Discharge: Continued Medical Work up  Expected Discharge Plan and Services Expected Discharge Plan: [place]   Discharge Planning Services: CM Consult   Living arrangements for the past 2 months: Homeless Shelter                   DME Agency: NA       HH Arranged: NA           Social Determinants of Health (SDOH) Interventions    Readmission Risk Interventions    11/07/2021    4:21 PM 02/19/2021    3:52 PM  Readmission Risk Prevention Plan  Transportation Screening Complete Complete  Medication Review Press photographer) Complete Complete  PCP or Specialist appointment within 3-5 days of discharge Complete Complete  HRI or [place] Complete Complete  SW Recovery Care/Counseling Consult Complete Complete  Palliative Care Screening Not Applicable Not [place] Not Applicable Not Applicable

## 2021-11-10 ENCOUNTER — Telehealth: Payer: Self-pay | Admitting: Nephrology

## 2021-11-10 LAB — CULTURE, BLOOD (ROUTINE X 2): Culture: NO GROWTH

## 2021-11-13 ENCOUNTER — Other Ambulatory Visit: Payer: Self-pay | Admitting: *Deleted

## 2021-11-14 ENCOUNTER — Emergency Department (HOSPITAL_COMMUNITY): Payer: Commercial Managed Care - HMO

## 2021-11-14 ENCOUNTER — Encounter (HOSPITAL_COMMUNITY): Payer: Self-pay | Admitting: Emergency Medicine

## 2021-11-14 ENCOUNTER — Observation Stay (HOSPITAL_COMMUNITY)
Admission: EM | Admit: 2021-11-14 | Discharge: 2021-11-15 | Disposition: A | Discharge status: Home / Self Care | Payer: Commercial Managed Care - HMO | Attending: Internal Medicine | Admitting: Internal Medicine

## 2021-11-14 ENCOUNTER — Other Ambulatory Visit: Payer: Self-pay

## 2021-11-14 DIAGNOSIS — E1122 Type 2 diabetes mellitus with diabetic chronic kidney disease: Secondary | ICD-10-CM | POA: Diagnosis not present

## 2021-11-14 DIAGNOSIS — Z794 Long term (current) use of insulin: Secondary | ICD-10-CM | POA: Insufficient documentation

## 2021-11-14 DIAGNOSIS — E8770 Fluid overload, unspecified: Secondary | ICD-10-CM | POA: Diagnosis not present

## 2021-11-14 DIAGNOSIS — J690 Pneumonitis due to inhalation of food and vomit: Secondary | ICD-10-CM | POA: Diagnosis not present

## 2021-11-14 DIAGNOSIS — I132 Hypertensive heart and chronic kidney disease with heart failure and with stage 5 chronic kidney disease, or end stage renal disease: Secondary | ICD-10-CM | POA: Diagnosis not present

## 2021-11-14 DIAGNOSIS — I161 Hypertensive emergency: Secondary | ICD-10-CM | POA: Diagnosis not present

## 2021-11-14 DIAGNOSIS — Z992 Dependence on renal dialysis: Secondary | ICD-10-CM | POA: Insufficient documentation

## 2021-11-14 DIAGNOSIS — J9601 Acute respiratory failure with hypoxia: Secondary | ICD-10-CM | POA: Insufficient documentation

## 2021-11-14 DIAGNOSIS — I160 Hypertensive urgency: Secondary | ICD-10-CM | POA: Diagnosis present

## 2021-11-14 DIAGNOSIS — Z79899 Other long term (current) drug therapy: Secondary | ICD-10-CM | POA: Diagnosis not present

## 2021-11-14 DIAGNOSIS — Z20822 Contact with and (suspected) exposure to covid-19: Secondary | ICD-10-CM | POA: Insufficient documentation

## 2021-11-14 DIAGNOSIS — I5043 Acute on chronic combined systolic (congestive) and diastolic (congestive) heart failure: Secondary | ICD-10-CM | POA: Diagnosis present

## 2021-11-14 DIAGNOSIS — N186 End stage renal disease: Secondary | ICD-10-CM | POA: Insufficient documentation

## 2021-11-14 DIAGNOSIS — R0602 Shortness of breath: Secondary | ICD-10-CM | POA: Diagnosis present

## 2021-11-14 DIAGNOSIS — Z7984 Long term (current) use of oral hypoglycemic drugs: Secondary | ICD-10-CM | POA: Diagnosis not present

## 2021-11-14 DIAGNOSIS — I509 Heart failure, unspecified: Secondary | ICD-10-CM

## 2021-11-14 LAB — COMPREHENSIVE METABOLIC PANEL
ALT: 44 U/L (ref 0–44)
AST: 37 U/L (ref 15–41)
Albumin: 2.2 g/dL — ABNORMAL LOW (ref 3.5–5.0)
Alkaline Phosphatase: 395 U/L — ABNORMAL HIGH (ref 38–126)
Anion gap: 14 (ref 5–15)
BUN: 74 mg/dL — ABNORMAL HIGH (ref 6–20)
CO2: 19 mmol/L — ABNORMAL LOW (ref 22–32)
Calcium: 7.4 mg/dL — ABNORMAL LOW (ref 8.9–10.3)
Chloride: 102 mmol/L (ref 98–111)
Creatinine, Ser: 9.95 mg/dL — ABNORMAL HIGH (ref 0.61–1.24)
GFR, Estimated: 6 mL/min — ABNORMAL LOW (ref >60)
Glucose, Bld: 246 mg/dL — ABNORMAL HIGH (ref 70–99)
Potassium: 4.6 mmol/L (ref 3.5–5.1)
Sodium: 135 mmol/L (ref 135–145)
Total Bilirubin: 1 mg/dL (ref 0.3–1.2)
Total Protein: 6.1 g/dL — ABNORMAL LOW (ref 6.5–8.1)

## 2021-11-14 LAB — I-STAT CHEM 8, ED
BUN: 65 mg/dL — ABNORMAL HIGH (ref 6–20)
Calcium, Ion: 0.91 mmol/L — ABNORMAL LOW (ref 1.15–1.40)
Chloride: 106 mmol/L (ref 98–111)
Creatinine, Ser: 10.9 mg/dL — ABNORMAL HIGH (ref 0.61–1.24)
Glucose, Bld: 237 mg/dL — ABNORMAL HIGH (ref 70–99)
HCT: 31 % — ABNORMAL LOW (ref 39.0–52.0)
Hemoglobin: 10.5 g/dL — ABNORMAL LOW (ref 13.0–17.0)
Potassium: 4.5 mmol/L (ref 3.5–5.1)
Sodium: 135 mmol/L (ref 135–145)
TCO2: 19 mmol/L — ABNORMAL LOW (ref 22–32)

## 2021-11-14 LAB — MAGNESIUM: Magnesium: 2.4 mg/dL (ref 1.7–2.4)

## 2021-11-14 LAB — CBC WITH DIFFERENTIAL/PLATELET
Abs Immature Granulocytes: 0.03 10*3/uL (ref 0.00–0.07)
Basophils Absolute: 0 10*3/uL (ref 0.0–0.1)
Basophils Relative: 0 %
Eosinophils Absolute: 0.2 10*3/uL (ref 0.0–0.5)
Eosinophils Relative: 3 %
HCT: 31.1 % — ABNORMAL LOW (ref 39.0–52.0)
Hemoglobin: 9.5 g/dL — ABNORMAL LOW (ref 13.0–17.0)
Immature Granulocytes: 0 %
Lymphocytes Relative: 14 %
Lymphs Abs: 1 10*3/uL (ref 0.7–4.0)
MCH: 28.4 pg (ref 26.0–34.0)
MCHC: 30.5 g/dL (ref 30.0–36.0)
MCV: 93.1 fL (ref 80.0–100.0)
Monocytes Absolute: 0.7 10*3/uL (ref 0.1–1.0)
Monocytes Relative: 10 %
Neutro Abs: 5.1 10*3/uL (ref 1.7–7.7)
Neutrophils Relative %: 73 %
Platelets: 323 10*3/uL (ref 150–400)
RBC: 3.34 MIL/uL — ABNORMAL LOW (ref 4.22–5.81)
RDW: 16.3 % — ABNORMAL HIGH (ref 11.5–15.5)
WBC: 7 10*3/uL (ref 4.0–10.5)
nRBC: 0 % (ref 0.0–0.2)

## 2021-11-14 LAB — BRAIN NATRIURETIC PEPTIDE: B Natriuretic Peptide: 4487.6 pg/mL — ABNORMAL HIGH (ref 0.0–100.0)

## 2021-11-14 LAB — TROPONIN I (HIGH SENSITIVITY): Troponin I (High Sensitivity): 183 ng/L (ref <18)

## 2021-11-14 MED ORDER — DIPHENHYDRAMINE HCL 50 MG/ML IJ SOLN
12.5000 mg | Freq: Once | INTRAMUSCULAR | Status: AC
  Administered 2021-11-15: 12.5 mg via INTRAVENOUS
  Filled 2021-11-14: qty 1

## 2021-11-14 NOTE — ED Notes (Signed)
Pt sleeping soundly  nasal 02 on his forehead  sats 100%

## 2021-11-14 NOTE — ED Triage Notes (Signed)
Pt bib [facility] for shortness of breath and chest pain. Dialysis on T, th, sat - 1 missed session. EMS found pt at 82% RA - spo2 100% on NRB.   BP 188/100, HR 91, CBG 253

## 2021-11-14 NOTE — ED Provider Notes (Signed)
[facility] EMERGENCY DEPARTMENT Provider Note   CSN: [id number] Arrival date & time: 11/14/21  2018     History {Add pertinent medical, surgical, social history, OB history to HPI:1} Chief Complaint  Patient presents with  . Shortness of Breath       . Chest Pain    [name] is a 51 y.o. male.   Shortness of Breath Associated symptoms: chest pain   Chest Pain Associated symptoms: shortness of breath   Patient presents for shortness of breath.  Medical history includes T2DM, HLD, homelessness, LBBB, ESRD on HD, CHF, and HTN.  He had a recent hospital admission from 6/19 to 6/22 for hypoxic respiratory failure.  This was deemed to be multifactorial but primarily attributed to volume overload secondary to missed dialysis.  Hypoxia resolved with in-hospital dialysis.  He was treated for aspiration pneumonia.  Patient reports that he has not taken any of his home medications since his hospital discharge 6 days ago.  He is scheduled for dialysis on Tuesday, Thursday, and Saturday.  His last dialysis session was on Saturday (4 days ago).  Patient endorses worsening bilateral lower extremity edema and worsening shortness of breath.  This evening, 1 hour prior to arrival, he had acute worsening of shortness of breath.  911 was called.  EMS noted SPO2 in the low 80s on room air.  He was placed on nonrebreather and brought to the ED.  Currently, patient endorses continued shortness of breath.  He denies any current pain.  He states that his dry weight is 72 kg.  He states that he does not have any oxygen at home.  He was previously using his mother's home oxygen as needed at this was removed from the home after she passed away.  He does still make a small amount of urine.    Home Medications Prior to Admission medications   Medication Sig Start Date End Date Taking? Authorizing Provider  amoxicillin-clavulanate (AUGMENTIN) 500-125 MG tablet Take 1 tablet (500 mg total)  by mouth daily. 11/08/21   [name], MD  glucose blood test strip Use to check fasting blood sugar once daily. 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 Patient not taking: Reported on 09/28/2021 02/26/21   [name], [name], MD  insulin glargine (LANTUS) 100 UNIT/ML Solostar Pen Inject 10 Units into the skin daily. May substitute with any other long-acting formulary. Patient not taking: Reported on 09/28/2021 05/13/21   [name], MD  Insulin Pen Needle 32G X 4 MM MISC Use with insulin pens 02/26/21   [name], [name], MD  RENVELA 800 MG tablet Take 800-1,600 mg by mouth See admin instructions. Take 2 tablets (1600 mg) three times a day with meals and 1 tablet (800 mg) twice a day as needed with snacks Patient not taking: Reported on 11/06/2021 10/25/21   [provider]  TRUEplus Lancets 28G MISC USE TO MEASURE BLOOD SUGAR TWICE A DAY 11/29/20 11/29/21  [name], MD  gabapentin (NEURONTIN) 300 MG capsule Take 1 capsule (300 mg total) by mouth 3 (three) times daily. Patient not taking: Reported on 07/27/2019 07/29/18 08/30/19  [name], FNP  insulin detemir (LEVEMIR FLEXTOUCH) 100 UNIT/ML FlexPen Inject 35 Units into the skin daily. Patient not taking: Reported on 02/17/2021 11/29/20 02/24/21  [name], MD  sildenafil (  VIAGRA) 25 MG tablet Take 1 tablet (25 mg total) by mouth as needed for erectile dysfunction. for erectile dysfunction. Patient not taking: Reported on 08/11/2020 10/13/19 11/27/20  [name], FNP  sitaGLIPtin (JANUVIA) 25 MG tablet Take 1 tablet (25 mg total) by mouth daily. Patient not taking: Reported on 08/11/2020 02/22/20 11/27/20  [name], MD      Allergies    Sulfa antibiotics, Keflex [cephalexin], Other, and Bactrim [sulfamethoxazole-trimethoprim]    Review of Systems    Review of Systems  Respiratory:  Positive for shortness of breath.   Cardiovascular:  Positive for chest pain and leg swelling.  All other systems reviewed and are negative.   Physical Exam Updated Vital Signs BP (!) 182/104   Pulse 88   Temp 98.6 F (37 C) (Oral)   Resp 18   Ht 5\' 7"  (1.702 m)   Wt 72 kg   SpO2 99%   BMI 24.86 kg/m  Physical Exam Vitals and nursing note reviewed.  Constitutional:      General: He is not in acute distress.    Appearance: He is well-developed. He is ill-appearing (Chronically). He is not toxic-appearing or diaphoretic.  HENT:     Head: Normocephalic and atraumatic.     Mouth/Throat:     Mouth: Mucous membranes are moist.     Pharynx: Oropharynx is clear.  Eyes:     Extraocular Movements: Extraocular movements intact.     Conjunctiva/sclera: Conjunctivae normal.  Neck:     Vascular: JVD present.  Cardiovascular:     Rate and Rhythm: Normal rate and regular rhythm.     Heart sounds: No murmur heard. Pulmonary:     Effort: Pulmonary effort is normal. Tachypnea present. No respiratory distress.     Breath sounds: Examination of the right-lower field reveals decreased breath sounds and rales. Examination of the left-lower field reveals decreased breath sounds and rales. Decreased breath sounds and rales present.  Chest:     Chest wall: No tenderness or crepitus.  Abdominal:     Palpations: Abdomen is soft.     Tenderness: There is no abdominal tenderness.  Musculoskeletal:        General: No swelling. Normal range of motion.     Cervical back: Normal range of motion and neck supple.     Right lower leg: Edema present.     Left lower leg: Edema present.  Skin:    General: Skin is warm and dry.     Coloration: Skin is not cyanotic.     Comments: Papular lesions on thorax and arms.  Per patient, these are pruritic and have been chronic over the past several months.  Neurological:     General: No focal deficit present.     Mental  Status: He is alert and oriented to person, place, and time.  Psychiatric:        Mood and Affect: Mood normal.        Behavior: Behavior normal.    ED Results / Procedures / Treatments   Labs (all labs ordered are listed, but only abnormal results are displayed) Labs Reviewed - No data to display  EKG None  Radiology No results found.  Procedures Procedures  {Document cardiac monitor, telemetry assessment procedure when appropriate:1}  Medications Ordered in ED Medications - No data to display  ED Course/ Medical Decision Making/ A&P  Medical Decision Making Amount and/or Complexity of Data Reviewed Labs: ordered. Radiology: ordered.   This patient presents to the ED for concern of shortness of breath, this involves an extensive number of treatment options, and is a complaint that carries with it a high risk of complications and morbidity.  The differential diagnosis includes volume overload from his dialysis, CHF exacerbation, pneumonia, reactive airway disease   Co morbidities that complicate the patient evaluation  T2DM, HLD, homelessness, LBBB, ESRD on HD, CHF, and HTN   Additional history obtained:  Additional history obtained from N/A External records from outside source obtained and reviewed including EMR   Lab Tests:  I Ordered, and personally interpreted labs.  The pertinent results include: Elevated creatinine and BUN consistent with ESRD; decreased bicarb; baseline anemia; no leukocytosis; normal electrolytes; increased troponin and BNP consistent with volume overload and ESRD   Imaging Studies ordered:  I ordered imaging studies including chest x-ray I independently visualized and interpreted imaging which showed baseline cardiomegaly with mild to moderate pulmonary edema I agree with the radiologist interpretation   Cardiac Monitoring: / EKG:  The patient was maintained on a cardiac monitor.  I personally viewed and  interpreted the cardiac monitored which showed an underlying rhythm of: Sinus rhythm   Consultations Obtained:  I requested consultation with the neurologist, Dr.***,  and discussed lab and imaging findings as well as pertinent plan - they recommend: ***   Problem List / ED Course / Critical interventions / Medication management  Patient presenting for shortness of breath.  EMS noted SPO2 in the low 80s on room air.  He was placed on nonrebreather and transported to the ED.  In the ED, patient was weaned to 3 L by nasal cannula with SPO2 of 100%.  Vital signs otherwise notable for hypertension.  Patient states that he has not taken any of his home medications since his hospital discharge 6 days ago.  Per chart review, he was instructed to stop taking his 3 blood pressure medicines at time of hospital discharge.  He was advised to simply continue insulin and Augmentin, which he was given for aspiration pneumonia.  Today, on exam, patient has mildly increased work of breathing.  He is able to speak in complete sentences.  He denies any current chest discomfort.  He has bibasilar crackles and diminished lung sounds.  He has pitting edema in the lower extremities.  I suspect volume overload.  Diagnostic work-up was initiated.  Patient's lab work is consistent with volume overload in the setting of ESRD and CHF.  Fortunately, his electrolytes are normal at this time.  BUN is close to baseline.  Initial troponin is elevated at 183, slightly above baseline.  BNP is elevated at 4487.  Patient remained on supplemental oxygen by nasal cannula.  Nephrology was consulted.***. I ordered medication including ***  for ***  Reevaluation of the patient after these medicines showed that the patient {resolved/improved/worsened:23923::"improved"} I have reviewed the patients home medicines and have made adjustments as needed   Social Determinants of Health:  ***   Test / Admission -  Considered:  ***   {Document critical care time when appropriate:1} {Document review of labs and clinical decision tools ie heart score, Chads2Vasc2 etc:1}  {Document your independent review of radiology images, and any outside records:1} {Document your discussion with family members, caretakers, and with consultants:1} {Document social determinants of health affecting pt's care:1} {Document your decision making why or why not admission, treatments were needed:1} Final Clinical Impression(s) /  ED Diagnoses Final diagnoses:  None    Rx / DC Orders ED Discharge Orders     None

## 2021-11-15 DIAGNOSIS — I5023 Acute on chronic systolic (congestive) heart failure: Secondary | ICD-10-CM

## 2021-11-15 DIAGNOSIS — I509 Heart failure, unspecified: Secondary | ICD-10-CM

## 2021-11-15 LAB — RESP PANEL BY RT-PCR (FLU A&B, COVID) ARPGX2
Influenza A by PCR: NEGATIVE
Influenza B by PCR: NEGATIVE
SARS Coronavirus 2 by RT PCR: NEGATIVE

## 2021-11-15 LAB — HEPATITIS B SURFACE ANTIBODY,QUALITATIVE: Hep B S Ab: NONREACTIVE

## 2021-11-15 LAB — HEPATITIS C ANTIBODY: HCV Ab: NONREACTIVE

## 2021-11-15 LAB — HEPATITIS B CORE ANTIBODY, TOTAL: Hep B Core Total Ab: NONREACTIVE

## 2021-11-15 LAB — TROPONIN I (HIGH SENSITIVITY): Troponin I (High Sensitivity): 190 ng/L (ref <18)

## 2021-11-15 LAB — HEPATITIS B SURFACE ANTIGEN: Hepatitis B Surface Ag: NONREACTIVE

## 2021-11-15 MED ORDER — CARVEDILOL 3.125 MG PO TABS
3.1250 mg | ORAL_TABLET | Freq: Once | ORAL | Status: DC
Start: 2021-11-15 — End: 2021-11-15

## 2021-11-15 MED ORDER — HYDRALAZINE HCL 25 MG PO TABS: 25.0000 mg | ORAL_TABLET | Freq: Three times a day (TID) | ORAL | Status: DC

## 2021-11-15 MED ORDER — ISOSORBIDE MONONITRATE ER 30 MG PO TB24: 30.0000 mg | ORAL_TABLET | Freq: Every day | ORAL | Status: DC

## 2021-11-15 MED ORDER — TRIAMCINOLONE ACETONIDE 0.1 % EX CREA: TOPICAL_CREAM | Freq: Two times a day (BID) | CUTANEOUS | Status: DC

## 2021-11-15 MED ORDER — HEPARIN SODIUM (PORCINE) 5000 UNIT/ML IJ SOLN: 5000.0000 [IU] | Freq: Three times a day (TID) | INTRAMUSCULAR | Status: DC

## 2021-11-15 MED ORDER — HYDRALAZINE HCL 25 MG PO TABS: 25.0000 mg | ORAL_TABLET | Freq: Four times a day (QID) | ORAL | Status: DC | PRN

## 2021-11-15 MED ORDER — SEVELAMER CARBONATE 800 MG PO TABS: 1600.0000 mg | ORAL_TABLET | Freq: Three times a day (TID) | ORAL | Status: DC

## 2021-11-15 MED ORDER — INSULIN ASPART 100 UNIT/ML IJ SOLN: 0.0000 [IU] | Freq: Three times a day (TID) | INTRAMUSCULAR | Status: DC

## 2021-11-15 MED ORDER — SEVELAMER CARBONATE 800 MG PO TABS: 800.0000 mg | ORAL_TABLET | Freq: Two times a day (BID) | ORAL | Status: DC | PRN

## 2021-11-15 MED ORDER — CHLORHEXIDINE GLUCONATE CLOTH 2 % EX PADS: 6.0000 | MEDICATED_PAD | Freq: Every day | CUTANEOUS | Status: DC

## 2021-11-15 MED ORDER — AMLODIPINE BESYLATE 5 MG PO TABS: 10.0000 mg | ORAL_TABLET | Freq: Once | ORAL | Status: DC

## 2021-11-15 MED ORDER — CARVEDILOL 3.125 MG PO TABS: 3.1250 mg | ORAL_TABLET | Freq: Two times a day (BID) | ORAL | Status: DC

## 2021-11-15 MED ORDER — AMOXICILLIN-POT CLAVULANATE 500-125 MG PO TABS
1.0000 | ORAL_TABLET | ORAL | Status: DC
  Filled 2021-11-15: qty 1

## 2021-11-15 NOTE — ED Provider Notes (Addendum)
  Physical Exam  BP (!) 206/86   Pulse 81   Temp 97.9 F (36.6 C)   Resp (!) 21   Ht 5\' 7"  (1.702 m)   Wt 77.3 kg   SpO2 97%   BMI 26.69 kg/m   Physical Exam  Procedures  Procedures  ED Course / MDM    Medical Decision Making Amount and/or Complexity of Data Reviewed Labs: ordered. Radiology: ordered.  Risk Prescription drug management. Decision regarding hospitalization.   Patient return to the ER after dialysis.  Reportedly they got 4 pounds off him.  Dr. Jonnie Finner from nephrology called and believes patient needs to be admitted to the hospital for further dialysis.  Will need recurrent dialysis and cannot get done well as an outpatient.  Will discuss with hospitalist as a bounce back to their service.   Patient's social determinants of health include homelessness which hurts his ability to receive medical care    Davonna Belling, MD 11/15/21 5750    Davonna Belling, MD 11/15/21 [phone]

## 2021-11-15 NOTE — ED Notes (Signed)
Pt refuse resp panel swab. Nurse notified

## 2021-11-15 NOTE — Procedures (Signed)
   I was present at this dialysis session, have reviewed the session itself and made  appropriate changes Kelly Splinter MD [place] pager [phone]   11/15/2021, 2:34 PM

## 2021-11-15 NOTE — Progress Notes (Signed)
Patient is shown to be off the floor. Last note for patient was at 2:12 AM. CSW attached homeless resources to patients AVS.

## 2021-11-15 NOTE — Consult Note (Signed)
Renal Service Consult Note [facility]  [name] 11/15/2021 [name], MD Requesting Physician: Dr [name]  Reason for Consult: ESRD pt w/ vol overload and SOB HPI: The patient is a 51 y.o. year-old w/ hx of anxiety, DM2, HTN, HL, LBBB, PNA, homelessness who presented to ED last night c/o of CP and SOB. Had missed outpt HD. EMS found SpO2 825 on RA. CXR showed IS edema. We are asked to see for dialysis.   Pt seen in HD unit.  Vague historian. [place]on the streets". Has missed HD. SOB main issues, endorsed LE edema. Has transportation set up but having problems otherwise getting to HD , is not specific on the problem.    ROS - denies CP, no joint pain, no HA, no blurry vision, no rash, no diarrhea, no nausea/ vomiting, no dysuria, no difficulty voiding   Past Medical History  Past Medical History:  Diagnosis Date   Anxiety 02/2019   Anxiety 10/2019   Diabetes mellitus type II, uncontrolled 07/17/2006   Elevated alkaline phosphatase level 01/2019   ESSENTIAL HYPERTENSION 07/17/2006   Homelessness 10/13/2021   HYPERLIPIDEMIA 11/24/2008   Insomnia 02/2019   Left bundle branch block 02/22/2019   Lesion of lip 10/2019   Pneumonia 10/22/2011   Suicidal ideations 02/2019   Vitamin D deficiency 01/2019   Past Surgical History  Past Surgical History:  Procedure Laterality Date   ARM SURGERY     AV FISTULA PLACEMENT Right 02/20/2021   Procedure: RIGHT ARTERIOVENOUS GRAFT CREATION;  Surgeon: Angelia Mould, MD;  Location: [facility];  Service: Vascular;  Laterality: Right;   INSERTION OF DIALYSIS CATHETER Left 02/20/2021   Procedure: INSERTION OF DIALYSIS CATHETER USING PALINDROME CATHETER;  Surgeon: Angelia Mould, MD;  Location: [facility] OR;  Service: Vascular;  Laterality: Left;   IR AV DIALY SHUNT INTRO NEEDLE/INTRACATH INITIAL W/PTA/IMG RIGHT Right 11/06/2021   IR REMOVAL TUN CV CATH W/O FL  07/27/2021   IR US GUIDE VASC ACCESS RIGHT  11/06/2021    ULTRASOUND GUIDANCE FOR VASCULAR ACCESS  02/20/2021   Procedure: ULTRASOUND GUIDANCE FOR VASCULAR ACCESS;  Surgeon: Angelia Mould, MD;  Location: [facility] OR;  Service: Vascular;;   Family History  Family History  Problem Relation Age of Onset   Hypertension Mother 75   Cancer Mother        bladder cancer   Alcohol abuse Father    Hypertension Brother    Social History  reports that he has never smoked. He has never used smokeless tobacco. He reports that he does not drink alcohol and does not use drugs. Allergies  Allergies  Allergen Reactions   Sulfa Antibiotics Rash    Severe rash.    Keflex [Cephalexin] Other (See Comments)    Patient with severe drug reaction including exfoliating skin rash and hypotension after being prescribed both cephalexin and sulfamethoxazole-trimethoprim simultaneously. Favor SMX as much more likely culprit as patient had tolerated other cephalosporins in the past, but cannot say with complete certainty that this was not related to cephalexin.    Other Other (See Comments)    Patient states he is allergic to the TB test - unknown reaction   Bactrim [Sulfamethoxazole-Trimethoprim] Rash   Home medications Prior to Admission medications   Medication Sig Start Date End Date Taking? Authorizing Provider  amoxicillin-clavulanate (AUGMENTIN) 500-125 MG tablet Take 1 tablet (500 mg total) by mouth daily. 11/08/21   [name], MD  glucose blood test strip Use to check  fasting blood sugar once daily. 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 Patient not taking: Reported on 09/28/2021 02/26/21   [name], [name], MD  insulin glargine (LANTUS) 100 UNIT/ML Solostar Pen Inject 10 Units into the skin daily. May substitute with any other long-acting formulary. Patient not taking: Reported on 09/28/2021 05/13/21    [name], MD  Insulin Pen Needle 32G X 4 MM MISC Use with insulin pens 02/26/21   [name], [name], MD  RENVELA 800 MG tablet Take 800-1,600 mg by mouth See admin instructions. Take 2 tablets (1600 mg) three times a day with meals and 1 tablet (800 mg) twice a day as needed with snacks Patient not taking: Reported on 11/06/2021 10/25/21   [provider]  TRUEplus Lancets 28G MISC USE TO MEASURE BLOOD SUGAR TWICE A DAY 11/29/20 11/29/21  [name], MD  gabapentin (NEURONTIN) 300 MG capsule Take 1 capsule (300 mg total) by mouth 3 (three) times daily. Patient not taking: Reported on 07/27/2019 07/29/18 08/30/19  [name], FNP  insulin detemir (LEVEMIR FLEXTOUCH) 100 UNIT/ML FlexPen Inject 35 Units into the skin daily. Patient not taking: Reported on 02/17/2021 11/29/20 02/24/21  [name], MD  sildenafil (VIAGRA) 25 MG tablet Take 1 tablet (25 mg total) by mouth as needed for erectile dysfunction. for erectile dysfunction. Patient not taking: Reported on 08/11/2020 10/13/19 11/27/20  [name], FNP  sitaGLIPtin (JANUVIA) 25 MG tablet Take 1 tablet (25 mg total) by mouth daily. Patient not taking: Reported on 08/11/2020 02/22/20 11/27/20  [name], MD     Vitals:   11/15/21 2505 11/15/21 0856 11/15/21 0940 11/15/21 0941  BP: (!) 219/93 (!) 227/100 (!) 225/98 (!) 206/86  Pulse: 81 81 82 81  Resp: 12  11 (!) 21  Temp:  97.9 F (36.6 C)    TempSrc:      SpO2: 100% 96% 97% 97%  Weight:      Height:       Exam Gen alert, no distress No rash, cyanosis or gangrene Sclera anicteric, throat clear  No jvd or bruits Chest occ rhonchi and basilar rales, no wheezing, no ^wob RRR no MRG Abd soft ntnd no mass or ascites +bs GU normal male MS no joint effusions or deformity Ext diffuse bilat 2+ pretib edema, no wounds or ulcers Neuro is alert, Ox 3 , nf    RUA AVF+bruit   Home meds include - insulin aspart/ glargine, renvela 2 ac tid   OP HD: [facility]  TTS  4h  450 /1.5   last admit 71kg  2K/3Ca bath RUA AVG Hep 2000   Assessment/ Plan: Acute hypoxic respiratory failure - in setting of missed HD. Had HD this am, would keep in hospital for another 1-2 HD sessions given his gross volume overload.  HTN/ vol - vol overloaded , BP's quite high, +sig LE edema. As above.   ESRD - TTS HD. HD today and tomorrow.  AVG malfunction: sp shuntogram in IR here on 06/20, they did PTA to brachial vein. Issue not completely resolved. May need further intervention.   Anemia  - HGB 9-11 here. Follow.   Metabolic bone disease - CCa in range, will add on phos. Continue binders.   Nutrition - Renal Carb mod diet. Nepro.   DMT2-per primary  Kelly Splinter  MD 11/15/2021, 9:54 AM Recent Labs  Lab 11/14/21 2119 11/14/21 2126  HGB 9.5* 10.5*  ALBUMIN 2.2*  --   CALCIUM 7.4*  --   CREATININE 9.95* 10.90*  K 4.6 4.5

## 2021-11-15 NOTE — ED Notes (Signed)
Pt still refusing the covid  swab

## 2021-11-15 NOTE — H&P (Signed)
History and Physical    [name] IWP:[id number] DOB: [birth year] DOA: 11/14/2021  PCP: Vevelyn Francois, NP (Confirm with patient/family/NH records and if not entered, this has to be entered at [facility] point of entry) Patient coming from: Homeless  I have personally briefly reviewed patient's old medical records in [place]  Chief Complaint: SOB  HPI: [name] is a 51 y.o. male with medical history significant of ESRD on HD, chronic HFrEF, HTN, anemia secondary to CKD, noncompliance, IDDM, diabetic neuropathy, presented with increasing shortness of breath.  Patient is ESRD on HD TTS, he missed Saturday's dialysis but refused to tell me the reason.  Appears that he lives on the street, droplets, homeless, but he does have Medicaid.  He reported that he does not have any money to buy medications.  Yesterday evening, patient came to the hospital complaining about increasing shortness of breath and chest pain.  Work-up found patient in pulmonary edema and emergency dialysis performed in the ED.  This morning, patient however continued to experience shortness of breath and his blood pressure significant elevated.  He denies any cough, no fever or chills no leg swelling.  Review of Systems: As per HPI otherwise 14 point review of systems negative.    Past Medical History:  Diagnosis Date   Anxiety 02/2019   Anxiety 10/2019   Diabetes mellitus type II, uncontrolled 07/17/2006   Elevated alkaline phosphatase level 01/2019   ESSENTIAL HYPERTENSION 07/17/2006   Homelessness 10/13/2021   HYPERLIPIDEMIA 11/24/2008   Insomnia 02/2019   Left bundle branch block 02/22/2019   Lesion of lip 10/2019   Pneumonia 10/22/2011   Suicidal ideations 02/2019   Vitamin D deficiency 01/2019    Past Surgical History:  Procedure Laterality Date   ARM SURGERY     AV FISTULA PLACEMENT Right 02/20/2021   Procedure: RIGHT ARTERIOVENOUS GRAFT CREATION;  Surgeon: Angelia Mould, MD;  Location:  [place];  Service: Vascular;  Laterality: Right;   INSERTION OF DIALYSIS CATHETER Left 02/20/2021   Procedure: INSERTION OF DIALYSIS CATHETER USING PALINDROME CATHETER;  Surgeon: Angelia Mould, MD;  Location: [place];  Service: Vascular;  Laterality: Left;   IR AV DIALY SHUNT INTRO NEEDLE/INTRACATH INITIAL W/PTA/IMG RIGHT Right 11/06/2021   IR REMOVAL TUN CV CATH W/O FL  07/27/2021   IR US GUIDE VASC ACCESS RIGHT  11/06/2021   ULTRASOUND GUIDANCE FOR VASCULAR ACCESS  02/20/2021   Procedure: ULTRASOUND GUIDANCE FOR VASCULAR ACCESS;  Surgeon: Angelia Mould, MD;  Location: [place];  Service: Vascular;;     reports that he has never smoked. He has never used smokeless tobacco. He reports that he does not drink alcohol and does not use drugs.  Allergies  Allergen Reactions   Sulfa Antibiotics Rash    Severe rash.    Keflex [Cephalexin] Other (See Comments)    Patient with severe drug reaction including exfoliating skin rash and hypotension after being prescribed both cephalexin and sulfamethoxazole-trimethoprim simultaneously. Favor SMX as much more likely culprit as patient had tolerated other cephalosporins in the past, but cannot say with complete certainty that this was not related to cephalexin.    Other Other (See Comments)    Patient states he is allergic to the TB test - unknown reaction   Bactrim [Sulfamethoxazole-Trimethoprim] Rash    Family History  Problem Relation Age of Onset   Hypertension Mother 5   Cancer Mother        bladder cancer   Alcohol abuse  Father    Hypertension Brother      Prior to Admission medications   Medication Sig Start Date End Date Taking? Authorizing Provider  amoxicillin-clavulanate (AUGMENTIN) 500-125 MG tablet Take 1 tablet (500 mg total) by mouth daily. 11/08/21   [name], MD  glucose blood test strip Use to check fasting blood sugar once daily. 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 Insulin dependent 11/29/20   [name], MD  insulin  aspart (NOVOLOG FLEXPEN) 100 UNIT/ML FlexPen Inject 5 Units into the skin 2 (two) times daily with a meal. Patient not taking: Reported on 09/28/2021 02/26/21   [name], [name], MD  insulin glargine (LANTUS) 100 UNIT/ML Solostar Pen Inject 10 Units into the skin daily. May substitute with any other long-acting formulary. Patient not taking: Reported on 09/28/2021 05/13/21   [name], MD  Insulin Pen Needle 32G X 4 MM MISC Use with insulin pens 02/26/21   [name], [name], MD  RENVELA 800 MG tablet Take 800-1,600 mg by mouth See admin instructions. Take 2 tablets (1600 mg) three times a day with meals and 1 tablet (800 mg) twice a day as needed with snacks Patient not taking: Reported on 11/06/2021 10/25/21   [provider]  TRUEplus Lancets 28G MISC USE TO MEASURE BLOOD SUGAR TWICE A DAY 11/29/20 11/29/21  [name], MD  gabapentin (NEURONTIN) 300 MG capsule Take 1 capsule (300 mg total) by mouth 3 (three) times daily. Patient not taking: Reported on 07/27/2019 07/29/18 08/30/19  [name], FNP  insulin detemir (LEVEMIR FLEXTOUCH) 100 UNIT/ML FlexPen Inject 35 Units into the skin daily. Patient not taking: Reported on 02/17/2021 11/29/20 02/24/21  [name], MD  sildenafil (VIAGRA) 25 MG tablet Take 1 tablet (25 mg total) by mouth as needed for erectile dysfunction. for erectile dysfunction. Patient not taking: Reported on 08/11/2020 10/13/19 11/27/20  [name], FNP  sitaGLIPtin (JANUVIA) 25 MG tablet Take 1 tablet (25 mg total) by mouth daily. Patient not taking: Reported on 08/11/2020 02/22/20 11/27/20  [name], MD    Physical Exam: Vitals:   11/15/21 0940 11/15/21 0941 11/15/21 0945 11/15/21 1000  BP: (!) 225/98 (!) 206/86 (!) 203/85 (!) 205/80  Pulse: 82 81 81 79  Resp: 11 (!) 21 11 12   Temp:      TempSrc:      SpO2: 97% 97% 98% 98%  Weight:      Height:        Constitutional: NAD, calm, comfortable Vitals:   11/15/21 0940 11/15/21  0941 11/15/21 0945 11/15/21 1000  BP: (!) 225/98 (!) 206/86 (!) 203/85 (!) 205/80  Pulse: 82 81 81 79  Resp: 11 (!) 21 11 12   Temp:      TempSrc:      SpO2: 97% 97% 98% 98%  Weight:      Height:       Eyes: PERRL, lids and conjunctivae normal ENMT: Mucous membranes are moist. Posterior pharynx clear of any exudate or lesions.Normal dentition.  Neck: normal, supple, no masses, no thyromegaly Respiratory: clear to auscultation bilaterally, no wheezing, fine crackles on bilateral bases. Normal respiratory effort. No accessory muscle use.  Cardiovascular: Regular rate and rhythm, no murmurs / rubs / gallops. No extremity edema. 2+ pedal pulses. No carotid bruits.  Abdomen: no tenderness, no masses palpated. No hepatosplenomegaly. Bowel sounds positive.  Musculoskeletal: no clubbing / cyanosis. No joint deformity upper and lower extremities. Good ROM, no contractures. Normal muscle tone.  Skin: no rashes, lesions,  ulcers. No induration Neurologic: CN 2-12 grossly intact. Sensation intact, DTR normal. Strength 5/5 in all 4.  Psychiatric: Normal judgment and insight. Alert and oriented x 3. Normal mood.    Labs on Admission: I have personally reviewed following labs and imaging studies  CBC: Recent Labs  Lab 11/14/21 2119 11/14/21 2126  WBC 7.0  --   NEUTROABS 5.1  --   HGB 9.5* 10.5*  HCT 31.1* 31.0*  MCV 93.1  --   PLT 323  --    Basic Metabolic Panel: Recent Labs  Lab 11/14/21 2119 11/14/21 2126  NA 135 135  K 4.6 4.5  CL 102 106  CO2 19*  --   GLUCOSE 246* 237*  BUN 74* 65*  CREATININE 9.95* 10.90*  CALCIUM 7.4*  --   MG 2.4  --    GFR: Estimated Creatinine Clearance: 7.7 mL/min (A) (by C-G formula based on SCr of 10.9 mg/dL (H)). Liver Function Tests: Recent Labs  Lab 11/14/21 2119  AST 37  ALT 44  ALKPHOS 395*  BILITOT 1.0  PROT 6.1*  ALBUMIN 2.2*   No results for input(s): "LIPASE", "AMYLASE" in the last 168 hours. No results for input(s): "AMMONIA" in  the last 168 hours. Coagulation Profile: No results for input(s): "INR", "PROTIME" in the last 168 hours. Cardiac Enzymes: No results for input(s): "CKTOTAL", "CKMB", "CKMBINDEX", "TROPONINI" in the last 168 hours. BNP (last 3 results) No results for input(s): "PROBNP" in the last 8760 hours. HbA1C: No results for input(s): "HGBA1C" in the last 72 hours. CBG: No results for input(s): "GLUCAP" in the last 168 hours. Lipid Profile: No results for input(s): "CHOL", "HDL", "LDLCALC", "TRIG", "CHOLHDL", "LDLDIRECT" in the last 72 hours. Thyroid Function Tests: No results for input(s): "TSH", "T4TOTAL", "FREET4", "T3FREE", "THYROIDAB" in the last 72 hours. Anemia Panel: No results for input(s): "VITAMINB12", "FOLATE", "FERRITIN", "TIBC", "IRON", "RETICCTPCT" in the last 72 hours. Urine analysis:    Component Value Date/Time   COLORURINE YELLOW 02/19/2021 0045   APPEARANCEUR HAZY (A) 02/19/2021 0045   APPEARANCEUR Clear 05/09/2020 1509   LABSPEC 1.013 02/19/2021 0045   PHURINE 5.0 02/19/2021 0045   GLUCOSEU >=500 (A) 02/19/2021 0045   HGBUR MODERATE (A) 02/19/2021 0045   BILIRUBINUR NEGATIVE 02/19/2021 0045   BILIRUBINUR Negative 05/09/2020 1509   KETONESUR NEGATIVE 02/19/2021 0045   PROTEINUR >=300 (A) 02/19/2021 0045   UROBILINOGEN 0.2 02/04/2020 1612   UROBILINOGEN 0.2 02/16/2014 0045   NITRITE NEGATIVE 02/19/2021 0045   LEUKOCYTESUR NEGATIVE 02/19/2021 0045    Radiological Exams on Admission: DG Chest Port 1 View  Result Date: 11/14/2021 CLINICAL DATA:  Dyspnea. EXAM: PORTABLE CHEST 1 VIEW COMPARISON:  Chest x-ray dated November 05, 2021. FINDINGS: Unchanged moderate cardiomegaly. Persistent pulmonary vascular congestion and mild interstitial thickening at the lung bases. Improved parahilar opacities. Chronic scarring in the right middle lobe and lingula. No pneumothorax or large pleural effusion. No acute osseous abnormality. IMPRESSION: 1. Improved now mild pulmonary edema.  Electronically Signed   By: Titus Dubin M.D.   On: 11/14/2021 20:58    EKG: Independently reviewed.  Next, chronic LBBB  Assessment/Plan Principal Problem:   CHF (congestive heart failure) (HCC) Active Problems:   Acute on chronic combined systolic and diastolic CHF (congestive heart failure) (HCC)   Hypertensive urgency  (please populate well all problems here in Problem List. (For example, if patient is on BP meds at home and you resume or decide to hold them, it is a problem that needs to be her.  Same for CAD, COPD, HLD and so on)  Acute hypoxic respiratory failure -Fluid overload secondary to noncompliant with HD and HTN emergency -Nephrology recommend additional HD session today  Acute on chronic HFrEF decompensation -Additional HD, strict and control of BP.  HTN emergency -Secondary to noncompliant with HD and BP meds -Adjust his BP meds, start Coreg discontinue amlodipine given history of systolic CHF.  Start hydralazine plus Imdur regimen.  IDDM -Sliding scale for now  Recent aspiration pneumonia -Continue Augmentin  Medical noncompliance -Consult case management  Chronic anemia secondary to CKD -H&H stable  DVT prophylaxis: Heparin subcu Code Status: Full code Family Communication: None at bedside Disposition Plan: Expect less than 2 midnight hospital stay Consults called: Nephrology Admission status: Telemetry observation   Lequita Halt MD [facility] Pager [phone]  11/15/2021, 1:02 PM

## 2021-11-15 NOTE — ED Notes (Signed)
This tech informed pt in ordered to go upstairs to dialysis he has to be swab, pt stated ok. Swab completed. Nurse notified.

## 2021-11-16 LAB — HEPATITIS B SURFACE ANTIBODY, QUANTITATIVE: Hep B S AB Quant (Post): <3.1 m[IU]/mL — ABNORMAL LOW (ref >9.9)

## 2021-11-19 ENCOUNTER — Emergency Department (HOSPITAL_COMMUNITY)
Admission: EM | Admit: 2021-11-19 | Discharge: 2021-11-19 | Disposition: A | Discharge status: Home / Self Care | Payer: Commercial Managed Care - HMO | Attending: Emergency Medicine | Admitting: Emergency Medicine

## 2021-11-19 ENCOUNTER — Encounter (HOSPITAL_COMMUNITY): Payer: Self-pay

## 2021-11-19 ENCOUNTER — Emergency Department (HOSPITAL_COMMUNITY): Payer: Commercial Managed Care - HMO

## 2021-11-19 DIAGNOSIS — N186 End stage renal disease: Secondary | ICD-10-CM | POA: Diagnosis not present

## 2021-11-19 DIAGNOSIS — I120 Hypertensive chronic kidney disease with stage 5 chronic kidney disease or end stage renal disease: Secondary | ICD-10-CM | POA: Insufficient documentation

## 2021-11-19 DIAGNOSIS — Z5900 Homelessness unspecified: Secondary | ICD-10-CM | POA: Insufficient documentation

## 2021-11-19 DIAGNOSIS — R0602 Shortness of breath: Secondary | ICD-10-CM

## 2021-11-19 DIAGNOSIS — E1122 Type 2 diabetes mellitus with diabetic chronic kidney disease: Secondary | ICD-10-CM | POA: Diagnosis not present

## 2021-11-19 DIAGNOSIS — Z992 Dependence on renal dialysis: Secondary | ICD-10-CM | POA: Insufficient documentation

## 2021-11-19 LAB — CBC
HCT: 30.1 % — ABNORMAL LOW (ref 39.0–52.0)
Hemoglobin: 9.6 g/dL — ABNORMAL LOW (ref 13.0–17.0)
MCH: 29.2 pg (ref 26.0–34.0)
MCHC: 31.9 g/dL (ref 30.0–36.0)
MCV: 91.5 fL (ref 80.0–100.0)
Platelets: 358 10*3/uL (ref 150–400)
RBC: 3.29 MIL/uL — ABNORMAL LOW (ref 4.22–5.81)
RDW: 15.5 % (ref 11.5–15.5)
WBC: 6 10*3/uL (ref 4.0–10.5)
nRBC: 0 % (ref 0.0–0.2)

## 2021-11-19 LAB — BASIC METABOLIC PANEL
Anion gap: 16 — ABNORMAL HIGH (ref 5–15)
BUN: 72 mg/dL — ABNORMAL HIGH (ref 6–20)
CO2: 21 mmol/L — ABNORMAL LOW (ref 22–32)
Calcium: 6.7 mg/dL — ABNORMAL LOW (ref 8.9–10.3)
Chloride: 100 mmol/L (ref 98–111)
Creatinine, Ser: 9.84 mg/dL — ABNORMAL HIGH (ref 0.61–1.24)
GFR, Estimated: 6 mL/min — ABNORMAL LOW (ref >60)
Glucose, Bld: 309 mg/dL — ABNORMAL HIGH (ref 70–99)
Potassium: 5 mmol/L (ref 3.5–5.1)
Sodium: 137 mmol/L (ref 135–145)

## 2021-11-19 NOTE — ED Notes (Signed)
Patient 02 was 88 RA. Patient was placed on 2L of 02. 02 came up to 96% on oxygen.

## 2021-11-19 NOTE — ED Notes (Signed)
Pt refusing chest xr after multiple explanations about why he needs one. MD notified

## 2021-11-19 NOTE — Discharge Instructions (Signed)
You were evaluated in the Emergency Department and after careful evaluation, we did not find any emergent condition requiring admission or further testing in the hospital.  Your exam/testing today was overall reassuring.  Recommend that you follow-up with your outpatient dialysis center for a make-up session.  Please return to the Emergency Department if you experience any worsening of your condition.  Thank you for allowing [place] to be a part of your care.

## 2021-11-19 NOTE — ED Provider Notes (Signed)
[facility] Emergency Department Provider Note MRN:  [id number]  Arrival date & time: 11/19/21     Chief Complaint   Shortness of Breath   History of Present Illness   [name] is a 51 y.o. year-old male with a history of DM, ESRD presenting to the ED with chief complaint of SOB.  Missed HD yesterday for "personal reasons".  SOB today.  General malaise.  No other complaints.  Review of Systems  A thorough review of systems was obtained and all systems are negative except as noted in the HPI and PMH.   Patient's Health History    Past Medical History:  Diagnosis Date   Anxiety 02/2019   Anxiety 10/2019   Diabetes mellitus type II, uncontrolled 07/17/2006   Elevated alkaline phosphatase level 01/2019   ESSENTIAL HYPERTENSION 07/17/2006   Homelessness 10/13/2021   HYPERLIPIDEMIA 11/24/2008   Insomnia 02/2019   Left bundle branch block 02/22/2019   Lesion of lip 10/2019   Pneumonia 10/22/2011   Suicidal ideations 02/2019   Vitamin D deficiency 01/2019    Past Surgical History:  Procedure Laterality Date   ARM SURGERY     AV FISTULA PLACEMENT Right 02/20/2021   Procedure: RIGHT ARTERIOVENOUS GRAFT CREATION;  Surgeon: Angelia Mould, MD;  Location: [facility] OR;  Service: Vascular;  Laterality: Right;   INSERTION OF DIALYSIS CATHETER Left 02/20/2021   Procedure: INSERTION OF DIALYSIS CATHETER USING PALINDROME CATHETER;  Surgeon: Angelia Mould, MD;  Location: [facility] OR;  Service: Vascular;  Laterality: Left;   IR AV DIALY SHUNT INTRO NEEDLE/INTRACATH INITIAL W/PTA/IMG RIGHT Right 11/06/2021   IR REMOVAL TUN CV CATH W/O FL  07/27/2021   IR US GUIDE VASC ACCESS RIGHT  11/06/2021   ULTRASOUND GUIDANCE FOR VASCULAR ACCESS  02/20/2021   Procedure: ULTRASOUND GUIDANCE FOR VASCULAR ACCESS;  Surgeon: Angelia Mould, MD;  Location: [facility] OR;  Service: Vascular;;    Family History  Problem Relation Age of Onset   Hypertension Mother 74   Cancer Mother         bladder cancer   Alcohol abuse Father    Hypertension Brother     Social History   Socioeconomic History   Marital status: Legally Separated    Spouse name: Not on file   Number of children: Not on file   Years of education: Not on file   Highest education level: Not on file  Occupational History   Occupation: cook    Comment: lost job   Occupation: Biochemist, clinical    Comment: Updated June 2013   Occupation: [place]: Began Jan 2014  Tobacco Use   Smoking status: Never   Smokeless tobacco: Never  Vaping Use   Vaping Use: Never used  Substance and Sexual Activity   Alcohol use: No    Alcohol/week: 0.0 standard drinks of alcohol   Drug use: No   Sexual activity: Yes    Partners: Female  Other Topics Concern   Not on file  Social History Narrative   Lives with his mother and brother.   Wife has had a foot amputation; is a type 1 DM.   Social Determinants of Health   Financial Resource Strain: Not on file  Food Insecurity: Not on file  Transportation Needs: Not on file  Physical Activity: Not on file  Stress: Not on file  Social Connections: Not on file  Intimate Partner Violence: Not on file     Physical Exam  Vitals:   11/19/21 0057 11/19/21 0353  BP: (!) 175/96 (!) 169/102  Pulse: 91 85  Resp: 16 20  Temp: 98.2 F (36.8 C)   SpO2: 92% 100%    CONSTITUTIONAL:  chronically ill-appearing, NAD NEURO/PSYCH:  Alert and oriented x 3, no focal deficits EYES:  eyes equal and reactive ENT/NECK:  no LAD, no JVD CARDIO:  regular rate, well-perfused, normal S1 and S2 PULM:  CTAB no wheezing or rhonchi GI/GU:  non-distended, non-tender MSK/SPINE:  No gross deformities, no edema SKIN:  no rash, atraumatic   *Additional and/or pertinent findings included in MDM below  Diagnostic and Interventional Summary    EKG Interpretation  Date/Time:  November 19, 2021 at 00:58:59 Ventricular Rate:  90 PR Interval:  144  QRS Duration:  152 QT  Interval:  452  QTC Calculation:  552 R Axis:     Text Interpretation: Sinus rhythm, left bundle branch block, no change from prior.       Labs Reviewed  CBC - Abnormal; Notable for the following components:      Result Value   RBC 3.29 (*)    Hemoglobin 9.6 (*)    HCT 30.1 (*)    All other components within normal limits  BASIC METABOLIC PANEL - Abnormal; Notable for the following components:   CO2 21 (*)    Glucose, Bld 309 (*)    BUN 72 (*)    Creatinine, Ser 9.84 (*)    Calcium 6.7 (*)    GFR, Estimated 6 (*)    Anion gap 16 (*)    All other components within normal limits    DG Chest 2 View    (Results Pending)    Medications - No data to display   Procedures  /  Critical Care Procedures  ED Course and Medical Decision Making  Initial Impression and Ddx SOB, missed HD.  Resting comfortably in no acute distress.  Oxygen saturation 100%, normal vital signs, no chest pain.  Differential diagnosis includes pulmonary edema, electrolyte disturbance, critical hyper-K.  Awaiting labs, x-ray.  Past medical/surgical history that increases complexity of ED encounter: ESRD  Interpretation of Diagnostics I personally reviewed the EKG and my interpretation is as follows: Sinus rhythm, bundle branch block, no significant change from prior  Labs overall reassuring, normal K, no significant uremia compared to prior values, no significant acidosis  Patient Reassessment and Ultimate Disposition/Management     Patient is refusing chest x-ray.  Continues to have normal vital signs, no increased work of breathing.  Doubt emergent process, appropriate for discharge.  Patient management required discussion with the following services or consulting groups:  None  Complexity of Problems Addressed Acute illness or injury that poses threat of life of bodily function  Additional Data Reviewed and Analyzed Further history obtained from: Prior labs/imaging results  Additional Factors  Impacting ED Encounter Risk None  Barth Kirks. Sedonia Small, MD [place] [email or web address]  Final Clinical Impressions(s) / ED Diagnoses     ICD-10-CM   1. SOB (shortness of breath)  R06.02       ED Discharge Orders     None        Discharge Instructions Discussed with and Provided to Patient:     Discharge Instructions      You were evaluated in the Emergency Department and after careful evaluation, we did not find any emergent condition requiring admission or further testing in the hospital.  Your exam/testing today was overall  reassuring.  Recommend that you follow-up with your outpatient dialysis center for a make-up session.  Please return to the Emergency Department if you experience any worsening of your condition.  Thank you for allowing [place] to be a part of your care.        Maudie Flakes, MD 11/19/21 [phone]

## 2021-11-19 NOTE — ED Triage Notes (Signed)
Pt comes via [facility] for SOB and malaise that has been going on all day, pt missed his dialysis yesterday.

## 2021-11-21 ENCOUNTER — Other Ambulatory Visit (HOSPITAL_BASED_OUTPATIENT_CLINIC_OR_DEPARTMENT_OTHER): Payer: Self-pay

## 2021-11-21 ENCOUNTER — Emergency Department (HOSPITAL_COMMUNITY): Payer: Commercial Managed Care - HMO

## 2021-11-21 ENCOUNTER — Emergency Department (HOSPITAL_COMMUNITY)
Admission: EM | Admit: 2021-11-21 | Discharge: 2021-11-21 | Disposition: A | Discharge status: Home / Self Care | Payer: Commercial Managed Care - HMO | Attending: Emergency Medicine | Admitting: Emergency Medicine

## 2021-11-21 DIAGNOSIS — R21 Rash and other nonspecific skin eruption: Secondary | ICD-10-CM | POA: Insufficient documentation

## 2021-11-21 DIAGNOSIS — R0602 Shortness of breath: Secondary | ICD-10-CM | POA: Insufficient documentation

## 2021-11-21 DIAGNOSIS — I120 Hypertensive chronic kidney disease with stage 5 chronic kidney disease or end stage renal disease: Secondary | ICD-10-CM | POA: Insufficient documentation

## 2021-11-21 DIAGNOSIS — N186 End stage renal disease: Secondary | ICD-10-CM | POA: Diagnosis not present

## 2021-11-21 DIAGNOSIS — Z992 Dependence on renal dialysis: Secondary | ICD-10-CM | POA: Insufficient documentation

## 2021-11-21 LAB — CBC
HCT: 30.8 % — ABNORMAL LOW (ref 39.0–52.0)
Hemoglobin: 9.9 g/dL — ABNORMAL LOW (ref 13.0–17.0)
MCH: 29 pg (ref 26.0–34.0)
MCHC: 32.1 g/dL (ref 30.0–36.0)
MCV: 90.3 fL (ref 80.0–100.0)
Platelets: 322 10*3/uL (ref 150–400)
RBC: 3.41 MIL/uL — ABNORMAL LOW (ref 4.22–5.81)
RDW: 15.8 % — ABNORMAL HIGH (ref 11.5–15.5)
WBC: 5.2 10*3/uL (ref 4.0–10.5)
nRBC: 0 % (ref 0.0–0.2)

## 2021-11-21 LAB — BASIC METABOLIC PANEL
Anion gap: 14 (ref 5–15)
BUN: 56 mg/dL — ABNORMAL HIGH (ref 6–20)
CO2: 25 mmol/L (ref 22–32)
Calcium: 6.6 mg/dL — ABNORMAL LOW (ref 8.9–10.3)
Chloride: 99 mmol/L (ref 98–111)
Creatinine, Ser: 8.05 mg/dL — ABNORMAL HIGH (ref 0.61–1.24)
GFR, Estimated: 8 mL/min — ABNORMAL LOW (ref >60)
Glucose, Bld: 284 mg/dL — ABNORMAL HIGH (ref 70–99)
Potassium: 3.8 mmol/L (ref 3.5–5.1)
Sodium: 138 mmol/L (ref 135–145)

## 2021-11-21 LAB — RPR: RPR Ser Ql: NONREACTIVE

## 2021-11-21 LAB — HIV ANTIBODY (ROUTINE TESTING W REFLEX): HIV Screen 4th Generation wRfx: NONREACTIVE

## 2021-11-21 MED ORDER — CARVEDILOL 12.5 MG PO TABS
12.5000 mg | ORAL_TABLET | Freq: Once | ORAL | Status: AC
  Administered 2021-11-21: 12.5 mg via ORAL
  Filled 2021-11-21: qty 1

## 2021-11-21 MED ORDER — HYDRALAZINE HCL 25 MG PO TABS
25.0000 mg | ORAL_TABLET | Freq: Once | ORAL | Status: AC
  Administered 2021-11-21: 25 mg via ORAL
  Filled 2021-11-21: qty 1

## 2021-11-21 MED ORDER — AMLODIPINE BESYLATE 10 MG PO TABS
10.0000 mg | ORAL_TABLET | Freq: Every day | ORAL | 3 refills | Status: DC
  Filled 2021-11-21 – 2021-11-22 (×2): qty 30, 30d supply, fill #0

## 2021-11-21 MED ORDER — CARVEDILOL 12.5 MG PO TABS: 12.5000 mg | ORAL_TABLET | Freq: Two times a day (BID) | ORAL | Status: DC

## 2021-11-21 MED ORDER — SEVELAMER CARBONATE 800 MG PO TABS
ORAL_TABLET | ORAL | 11 refills | Status: DC
  Filled 2021-11-21: qty 240, 30d supply, fill #0
  Filled 2021-11-22: qty 240, 24d supply, fill #0

## 2021-11-21 MED ORDER — DIPHENHYDRAMINE HCL 25 MG PO CAPS
25.0000 mg | ORAL_CAPSULE | Freq: Once | ORAL | Status: AC
  Administered 2021-11-21: 25 mg via ORAL
  Filled 2021-11-21: qty 1

## 2021-11-21 NOTE — ED Provider Notes (Signed)
[facility] Provider Note   CSN: [id number] Arrival date & time: 11/21/21  0148     History  Chief Complaint  Patient presents with   Shortness of Breath    [name] is a 51 y.o. male.  The history is provided by the patient.  Shortness of Breath Severity:  Moderate Onset quality:  Gradual Progression:  Worsening Chronicity:  Recurrent Exacerbated by: Heat. Associated symptoms: no fever   Patient with history of end-stage renal disease on dialysis presents for his 20th ER visit in 6 months.  Patient reports he is unhoused and has been outside in the heat and makes his shortness of breath worse.  No fever/vomiting/chest pain.  No significant cough.  He report chronic lower extremity edema.  He also reports a rash has had for weeks He reports he was dialyzed earlier in the day but left 20 minutes early    Home Medications Prior to Admission medications   Medication Sig Start Date End Date Taking? Authorizing Provider  amoxicillin-clavulanate (AUGMENTIN) 500-125 MG tablet Take 1 tablet (500 mg total) by mouth daily. 11/08/21   [name], MD  glucose blood test strip Use to check fasting blood sugar once daily. 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 Patient not taking: Reported on 09/28/2021 02/26/21   [name], [name], MD  insulin glargine (LANTUS) 100 UNIT/ML Solostar Pen Inject 10 Units into the skin daily. May substitute with any other long-acting formulary. Patient not taking: Reported on 09/28/2021 05/13/21   [name], MD  Insulin Pen Needle 32G X 4 MM MISC Use with insulin pens 02/26/21   [name], [name], MD  RENVELA 800 MG tablet Take 800-1,600 mg by mouth See admin instructions. Take 2 tablets (1600 mg) three times a day with meals and 1 tablet (800 mg) twice a day as  needed with snacks Patient not taking: Reported on 11/06/2021 10/25/21   [provider]  TRUEplus Lancets 28G MISC USE TO MEASURE BLOOD SUGAR TWICE A DAY 11/29/20 11/29/21  [name], MD  gabapentin (NEURONTIN) 300 MG capsule Take 1 capsule (300 mg total) by mouth 3 (three) times daily. Patient not taking: Reported on 07/27/2019 07/29/18 08/30/19  [name], FNP  insulin detemir (LEVEMIR FLEXTOUCH) 100 UNIT/ML FlexPen Inject 35 Units into the skin daily. Patient not taking: Reported on 02/17/2021 11/29/20 02/24/21  [name], MD  sildenafil (VIAGRA) 25 MG tablet Take 1 tablet (25 mg total) by mouth as needed for erectile dysfunction. for erectile dysfunction. Patient not taking: Reported on 08/11/2020 10/13/19 11/27/20  [name], FNP  sitaGLIPtin (JANUVIA) 25 MG tablet Take 1 tablet (25 mg total) by mouth daily. Patient not taking: Reported on 08/11/2020 02/22/20 11/27/20  [name], MD      Allergies    Sulfa antibiotics, Keflex [cephalexin], Other, and Bactrim [sulfamethoxazole-trimethoprim]    Review of Systems   Review of Systems  Constitutional:  Negative for fever.  Respiratory:  Positive for shortness of breath.     Physical Exam Updated Vital Signs BP (!) 195/108   Pulse 86   Temp 98.2 F (36.8 C)   Resp (!) 25   SpO2 99%  Physical Exam CONSTITUTIONAL: Chronically ill-appearing, no acute distress HEAD: Normocephalic/atraumatic EYES: EOMI ENMT: Mucous membranes moist NECK: supple no meningeal signs  CV: S1/S2 noted LUNGS: Coarse breath sounds bilaterally no apparent distress ABDOMEN: soft, nontender NEURO: Pt is awake/alert/appropriate, moves all extremitiesx4.  No facial droop.   EXTREMITIES: pulses normal/equal, full ROM, dialysis access right arm with thrill noted Pitting edema to bilateral lower extremities SKIN: warm, vesicular type rash throughout his body without erythema or discharge PSYCH: no abnormalities of mood noted,  alert and oriented to situation  ED Results / Procedures / Treatments   Labs (all labs ordered are listed, but only abnormal results are displayed) Labs Reviewed  CBC - Abnormal; Notable for the following components:      Result Value   RBC 3.41 (*)    Hemoglobin 9.9 (*)    HCT 30.8 (*)    RDW 15.8 (*)    All other components within normal limits  BASIC METABOLIC PANEL - Abnormal; Notable for the following components:   Glucose, Bld 284 (*)    BUN 56 (*)    Creatinine, Ser 8.05 (*)    Calcium 6.6 (*)    GFR, Estimated 8 (*)    All other components within normal limits  HIV ANTIBODY (ROUTINE TESTING W REFLEX)  RPR    EKG EKG Interpretation  Date/Time:  Wednesday November 21 2021 02:24:01 EDT Ventricular Rate:  84 PR Interval:  145 QRS Duration: 157 QT Interval:  491 QTC Calculation: 581 R Axis:   81 Text Interpretation: Sinus rhythm Right atrial enlargement Left ventricular hypertrophy Borderline T abnormalities, lateral leads Prolonged QT interval Confirmed by Ripley Fraise [phone]) on 11/21/2021 2:28:08 AM  Radiology DG Chest Port 1 View  Result Date: 11/21/2021 CLINICAL DATA:  Shortness of breath. EXAM: PORTABLE CHEST 1 VIEW COMPARISON:  November 14, 2021 FINDINGS: The cardiac silhouette is markedly enlarged and unchanged in size. Mild bilateral perihilar and mild left basilar atelectasis and/or infiltrate is seen. There is no evidence of a pleural effusion or pneumothorax. The visualized skeletal structures are unremarkable. IMPRESSION: 1. Stable cardiomegaly. 2. Mild bilateral perihilar and mild left basilar atelectasis and/or infiltrate. Electronically Signed   By: Virgina Norfolk M.D.   On: 11/21/2021 02:24    Procedures Procedures    Medications Ordered in ED Medications  diphenhydrAMINE (BENADRYL) capsule 25 mg (has no administration in time range)  hydrALAZINE (APRESOLINE) tablet 25 mg (25 mg Oral Given 11/21/21 0338)  carvedilol (COREG) tablet 12.5 mg (12.5 mg Oral  Given 11/21/21 0337)    ED Course/ Medical Decision Making/ A&P Clinical Course as of 11/21/21 0409  Wed Nov 21, 2021  0301 Hemoglobin(!): 9.9 Chronic anemia noted [DW]  0308 Glucose(!): 284 Hyperglycemia [DW]  0308 Creatinine(!): 8.05 Chronic renal failure [DW]  0404 Patient resting comfortably.  No distress noted.  No hypoxia.  No signs of acute pulmonary edema.  No hyperkalemia. [DW]  7867 Patient remains hypertensive, but no acute distress, no hypoxia.  Patient had recently been taken off his BP meds.  I ordered his previous meds to be given orally here.  Patient is in no distress except that he is itching.  Benadryl is been given. [DW]  0408 No signs of acute cardiopulmonary emergency at this time.  Patient will follow-up with dialysis tomorrow [DW]    Clinical Course User Index [DW] Ripley Fraise, MD                           Medical Decision Making Amount and/or Complexity of Data Reviewed Labs: ordered. Decision-making details documented in ED Course. Radiology: ordered.  Risk Prescription drug management.  This patient presents to the ED for concern of shortness of breath, this involves an extensive number of treatment options, and is a complaint that carries with it a high risk of complications and morbidity.  The differential diagnosis includes but is not limited to Acute coronary syndrome, pneumonia, acute pulmonary edema, pneumothorax, acute anemia, pulmonary embolism    Comorbidities that complicate the patient evaluation: Patient's presentation is complicated by their history of end-stage renal disease  Social Determinants of Health: Patient's  housing insecurity   increases the complexity of managing their presentation  Additional history obtained:  Records reviewed previous admission documents  Lab Tests: I Ordered, and personally interpreted labs.  The pertinent results include: hyperGlycemia, chronic renal failure  Imaging Studies ordered: I ordered  imaging studies including X-ray chest   I independently visualized and interpreted imaging which showed cardiomegaly I agree with the radiologist interpretation  Cardiac Monitoring: The patient was maintained on a cardiac monitor.  I personally viewed and interpreted the cardiac monitor which showed an underlying rhythm of:  sinus rhythm  Medicines ordered and prescription drug management: I ordered medication including hydralazine and Coreg for hypertension   Reevaluation: After the interventions noted above, I reevaluated the patient and found that they have :stayed the same  Complexity of problems addressed: Patient's presentation is most consistent with  acute presentation with potential threat to life or bodily function  Disposition: After consideration of the diagnostic results and the patient's response to treatment,  I feel that the patent would benefit from discharge   .     Patient also requesting referral to ophthalmology HIV and RPR also sent for his chronic rash It is unclear patient actually has a PCP, social work consult has been placed       Final Clinical Impression(s) / ED Diagnoses Final diagnoses:  Shortness of breath  ESRD (end stage renal disease) ([place])  Rash    Rx / DC Orders ED Discharge Orders     None         Ripley Fraise, MD 11/21/21 0410

## 2021-11-21 NOTE — ED Triage Notes (Addendum)
[place] of breath throughout the day, pt stating the heat makes it worse. Pt also complaining of abdomen pain and distention for a few days. Pt was dialysised today but left 20 minutes before his session was finished. Pitting edema noted to both ankles. Possible pneumonia    Cbg 317 176/95 98% ra

## 2021-11-22 ENCOUNTER — Other Ambulatory Visit: Payer: Self-pay

## 2021-11-22 ENCOUNTER — Other Ambulatory Visit (HOSPITAL_BASED_OUTPATIENT_CLINIC_OR_DEPARTMENT_OTHER): Payer: Self-pay

## 2021-11-23 ENCOUNTER — Emergency Department (HOSPITAL_COMMUNITY): Payer: Commercial Managed Care - HMO

## 2021-11-23 ENCOUNTER — Other Ambulatory Visit: Payer: Self-pay

## 2021-11-23 ENCOUNTER — Encounter (HOSPITAL_COMMUNITY): Payer: Self-pay

## 2021-11-23 ENCOUNTER — Emergency Department (HOSPITAL_COMMUNITY)
Admission: EM | Admit: 2021-11-23 | Discharge: 2021-11-23 | Disposition: A | Discharge status: Home / Self Care | Payer: Commercial Managed Care - HMO | Attending: Emergency Medicine | Admitting: Emergency Medicine

## 2021-11-23 DIAGNOSIS — R0600 Dyspnea, unspecified: Secondary | ICD-10-CM | POA: Insufficient documentation

## 2021-11-23 DIAGNOSIS — Z794 Long term (current) use of insulin: Secondary | ICD-10-CM | POA: Insufficient documentation

## 2021-11-23 DIAGNOSIS — Z992 Dependence on renal dialysis: Secondary | ICD-10-CM | POA: Diagnosis not present

## 2021-11-23 DIAGNOSIS — N186 End stage renal disease: Secondary | ICD-10-CM | POA: Insufficient documentation

## 2021-11-23 LAB — CBC WITH DIFFERENTIAL/PLATELET
Abs Immature Granulocytes: 0.02 10*3/uL (ref 0.00–0.07)
Basophils Absolute: 0 10*3/uL (ref 0.0–0.1)
Basophils Relative: 0 %
Eosinophils Absolute: 0.1 10*3/uL (ref 0.0–0.5)
Eosinophils Relative: 3 %
HCT: 32.7 % — ABNORMAL LOW (ref 39.0–52.0)
Hemoglobin: 10.1 g/dL — ABNORMAL LOW (ref 13.0–17.0)
Immature Granulocytes: 0 %
Lymphocytes Relative: 17 %
Lymphs Abs: 0.9 10*3/uL (ref 0.7–4.0)
MCH: 28.5 pg (ref 26.0–34.0)
MCHC: 30.9 g/dL (ref 30.0–36.0)
MCV: 92.4 fL (ref 80.0–100.0)
Monocytes Absolute: 0.6 10*3/uL (ref 0.1–1.0)
Monocytes Relative: 12 %
Neutro Abs: 3.3 10*3/uL (ref 1.7–7.7)
Neutrophils Relative %: 68 %
Platelets: 295 10*3/uL (ref 150–400)
RBC: 3.54 MIL/uL — ABNORMAL LOW (ref 4.22–5.81)
RDW: 15.8 % — ABNORMAL HIGH (ref 11.5–15.5)
WBC: 4.9 10*3/uL (ref 4.0–10.5)
nRBC: 0 % (ref 0.0–0.2)

## 2021-11-23 LAB — BASIC METABOLIC PANEL
Anion gap: 11 (ref 5–15)
BUN: 55 mg/dL — ABNORMAL HIGH (ref 6–20)
CO2: 27 mmol/L (ref 22–32)
Calcium: 7.2 mg/dL — ABNORMAL LOW (ref 8.9–10.3)
Chloride: 101 mmol/L (ref 98–111)
Creatinine, Ser: 8.07 mg/dL — ABNORMAL HIGH (ref 0.61–1.24)
GFR, Estimated: 8 mL/min — ABNORMAL LOW (ref >60)
Glucose, Bld: 282 mg/dL — ABNORMAL HIGH (ref 70–99)
Potassium: 3.8 mmol/L (ref 3.5–5.1)
Sodium: 139 mmol/L (ref 135–145)

## 2021-11-23 LAB — TROPONIN I (HIGH SENSITIVITY): Troponin I (High Sensitivity): 107 ng/L (ref <18)

## 2021-11-23 MED ORDER — FUROSEMIDE 40 MG PO TABS
120.0000 mg | ORAL_TABLET | Freq: Once | ORAL | Status: AC
  Administered 2021-11-23: 120 mg via ORAL
  Filled 2021-11-23: qty 3

## 2021-11-23 MED ORDER — FUROSEMIDE 10 MG/ML IJ SOLN
80.0000 mg | Freq: Once | INTRAMUSCULAR | Status: DC
  Filled 2021-11-23: qty 8

## 2021-11-23 NOTE — Discharge Instructions (Addendum)
Your work-up today was overall reassuring.  No concerning cause of your symptoms were identified small amounts of fluid around the lung.  You received Lasix dose in the emergency room.  Please keep your appointment with dialysis center tomorrow for your scheduled dialysis session.  If you have any worsening symptoms you can return for evaluation otherwise follow-up with your PCP.

## 2021-11-23 NOTE — ED Provider Notes (Signed)
[facility] DEPT Provider Note   CSN: [id number] Arrival date & time: 11/23/21  0244     History  Chief Complaint  Patient presents with   Shortness of Breath    [name] is a 51 y.o. male.  51 year old male presents today for evaluation of shortness of breath.  He does have history of end-stage renal disease and is on dialysis on T/TH/S schedule.  Reports last session yesterday.  Denies chest pain.  Reports shortness of breath came on acutely about 4 hours ago.  Reports he had a full session yesterday.  Denies other complaints.  Reports compliance with home medications.  The history is provided by the patient. No language interpreter was used.       Home Medications Prior to Admission medications   Medication Sig Start Date End Date Taking? Authorizing Provider  amLODipine (NORVASC) 10 MG tablet Take 1 tablet by mouth once a day 11/20/21     amoxicillin-clavulanate (AUGMENTIN) 500-125 MG tablet Take 1 tablet (500 mg total) by mouth daily. 11/08/21   [name], MD  glucose blood test strip Use to check fasting blood sugar once daily. 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 Patient not taking: Reported on 09/28/2021 02/26/21   [name], [name], MD  insulin glargine (LANTUS) 100 UNIT/ML Solostar Pen Inject 10 Units into the skin daily. May substitute with any other long-acting formulary. Patient not taking: Reported on 09/28/2021 05/13/21   [name], MD  Insulin Pen Needle 32G X 4 MM MISC Use with insulin pens 02/26/21   [name], [name], MD  RENVELA 800 MG tablet Take 800-1,600 mg by mouth See admin instructions. Take 2 tablets (1600 mg) three times a day with meals and 1 tablet (800 mg) twice a day as needed with snacks Patient not taking: Reported on 11/06/2021 10/25/21   [provider]  sevelamer carbonate (RENVELA) 800 MG tablet Take 2 tablet by mouth three times a day with meals and 1 tablet twice daily as needed with snacks 11/20/21     TRUEplus Lancets 28G MISC USE TO MEASURE BLOOD SUGAR TWICE A DAY 11/29/20 11/29/21  [name], MD  gabapentin (NEURONTIN) 300 MG capsule Take 1 capsule (300 mg total) by mouth 3 (three) times daily. Patient not taking: Reported on 07/27/2019 07/29/18 08/30/19  [name], FNP  insulin detemir (LEVEMIR FLEXTOUCH) 100 UNIT/ML FlexPen Inject 35 Units into the skin daily. Patient not taking: Reported on 02/17/2021 11/29/20 02/24/21  [name], MD  sildenafil (VIAGRA) 25 MG tablet Take 1 tablet (25 mg total) by mouth as needed for erectile dysfunction. for erectile dysfunction. Patient not taking: Reported on 08/11/2020 10/13/19 11/27/20  [name], FNP  sitaGLIPtin (JANUVIA) 25 MG tablet Take 1 tablet (25 mg total) by mouth daily. Patient not taking: Reported on 08/11/2020 02/22/20 11/27/20  [name], MD      Allergies    Sulfa antibiotics, Keflex [cephalexin], Other, and Bactrim [sulfamethoxazole-trimethoprim]    Review of Systems   Review of Systems  Constitutional:  Negative for chills and fever.  Respiratory:  Positive for shortness of breath. Negative for cough.   Cardiovascular:  Positive for leg swelling. Negative for chest pain.  Gastrointestinal:  Negative for abdominal distention, nausea and vomiting.  Neurological:  Negative for syncope and light-headedness.  All other systems reviewed and are negative.   Physical Exam Updated Vital Signs BP (!) 188/105   Pulse 83   Temp 98.5 F (36.9 C)   Resp (!) 24   Ht 5\' 7"  (1.702 m)   Wt 73.5 kg   SpO2 98%   BMI 25.37 kg/m  Physical Exam Vitals and nursing note reviewed.  Constitutional:      General: He is not in acute distress.    Appearance: Normal appearance. He is not ill-appearing.  HENT:     Head: Normocephalic and atraumatic.      Nose: Nose normal.  Eyes:     General: No scleral icterus.    Extraocular Movements: Extraocular movements intact.     Conjunctiva/sclera: Conjunctivae normal.  Cardiovascular:     Rate and Rhythm: Normal rate and regular rhythm.     Pulses: Normal pulses.  Pulmonary:     Effort: Pulmonary effort is normal. No respiratory distress.     Breath sounds: Normal breath sounds. No wheezing or rales.  Abdominal:     General: There is no distension.     Palpations: Abdomen is soft.     Tenderness: There is no abdominal tenderness. There is no guarding.  Musculoskeletal:        General: Normal range of motion.     Cervical back: Normal range of motion.     Right lower leg: Edema present.     Left lower leg: Edema present.  Skin:    General: Skin is warm and dry.  Neurological:     General: No focal deficit present.     Mental Status: He is alert. Mental status is at baseline.     ED Results / Procedures / Treatments   Labs (all labs ordered are listed, but only abnormal results are displayed) Labs Reviewed  CBC WITH DIFFERENTIAL/PLATELET  BASIC METABOLIC PANEL  TROPONIN I (HIGH SENSITIVITY)    EKG None  Radiology No results found.  Procedures Procedures    Medications Ordered in ED Medications - No data to display  ED Course/ Medical Decision Making/ A&P Clinical Course as of 11/23/21 5102  Fri Nov 23, 2021  0512 DG Chest Portable 1 View [AA]    Clinical Course User Index [AA] Evlyn Courier, PA-C                           Medical Decision Making Amount and/or Complexity of Data Reviewed Labs: ordered. Radiology: ordered. Decision-making details documented in ED Course.  Risk Prescription drug management.   51 year old male with past medical history of end-stage renal disease on dialysis presents today for evaluation of dyspnea.  Last full scheduled session yesterday.  States symptoms started couple hours prior to arrival.  Denies chest pain.  Is resting  comfortably during my exam with his eyes closed.  States he is comfortable now.  Doubt ACS.  Will obtain blood work, EKG, and troponin.  Chest x-ray obtained.  Chest x-ray shows slight haziness which could be pulmonary edema.  Patient does make urine.  Lasix 80 mg provided.  Patient otherwise is appropriate for discharge.  He has a scheduled dialysis session tomorrow.  Return precautions discussed.  Patient voices understanding and is in agreement with plan.  Case discussed with attending as well.  Creatinine is at patient's baseline.  Troponin of 107 it is decreased from patient's baseline.  Hemoglobin is at patient's baseline.  He is without leukocytosis.  No evidence of  pneumonia or other concerning findings for patient's symptoms.  Refused Lasix 80 mg IM.  120 mg Lasix p.o. provided. Patient is appropriate for discharge.  Discharged in stable condition.  Return precautions discussed.  Patient reports understanding and is in agreement with plan.  Final Clinical Impression(s) / ED Diagnoses Final diagnoses:  Dyspnea, unspecified type    Rx / DC Orders ED Discharge Orders     None         Evlyn Courier, PA-C 11/23/21 0631    Palumbo, April, MD 11/23/21 [phone]

## 2021-11-23 NOTE — ED Triage Notes (Signed)
BIB [facility] with c/o shortness of breath x 4 hours. Tu/Th/Sat HD - last tx 7/6.

## 2021-11-23 NOTE — ED Notes (Signed)
Attempted to start and IV in order to obtain blood work. The pt is refusing to allow an IV. I explained to the pt I may have to do more blood work later and without the IV I may have to stick him again to which he said "ok that's fine I'm not getting an iv." I also advised the provider may ask for meds to be administered thru IV and he said he wouldn't take them b/c he did not want an iv and would take them PO if he needed to take anything. Used butterfly to obtain blood and no iv is placed

## 2021-11-24 ENCOUNTER — Other Ambulatory Visit: Payer: Self-pay

## 2021-11-24 ENCOUNTER — Encounter (HOSPITAL_COMMUNITY): Payer: Self-pay | Admitting: Emergency Medicine

## 2021-11-24 ENCOUNTER — Emergency Department (HOSPITAL_COMMUNITY)
Admission: EM | Admit: 2021-11-24 | Discharge: 2021-11-24 | Discharge status: Against Medical Advice | Payer: Commercial Managed Care - HMO | Attending: Emergency Medicine | Admitting: Emergency Medicine

## 2021-11-24 DIAGNOSIS — Z5321 Procedure and treatment not carried out due to patient leaving prior to being seen by health care provider: Secondary | ICD-10-CM | POA: Diagnosis not present

## 2021-11-24 DIAGNOSIS — Z992 Dependence on renal dialysis: Secondary | ICD-10-CM | POA: Diagnosis not present

## 2021-11-24 DIAGNOSIS — R2243 Localized swelling, mass and lump, lower limb, bilateral: Secondary | ICD-10-CM | POA: Diagnosis not present

## 2021-11-24 LAB — BASIC METABOLIC PANEL
Anion gap: 14 (ref 5–15)
BUN: 57 mg/dL — ABNORMAL HIGH (ref 6–20)
CO2: 23 mmol/L (ref 22–32)
Calcium: 7.3 mg/dL — ABNORMAL LOW (ref 8.9–10.3)
Chloride: 101 mmol/L (ref 98–111)
Creatinine, Ser: 8.89 mg/dL — ABNORMAL HIGH (ref 0.61–1.24)
GFR, Estimated: 7 mL/min — ABNORMAL LOW (ref >60)
Glucose, Bld: 175 mg/dL — ABNORMAL HIGH (ref 70–99)
Potassium: 4.1 mmol/L (ref 3.5–5.1)
Sodium: 138 mmol/L (ref 135–145)

## 2021-11-24 LAB — CBC
HCT: 34.9 % — ABNORMAL LOW (ref 39.0–52.0)
Hemoglobin: 11.1 g/dL — ABNORMAL LOW (ref 13.0–17.0)
MCH: 29.1 pg (ref 26.0–34.0)
MCHC: 31.8 g/dL (ref 30.0–36.0)
MCV: 91.4 fL (ref 80.0–100.0)
Platelets: 353 10*3/uL (ref 150–400)
RBC: 3.82 MIL/uL — ABNORMAL LOW (ref 4.22–5.81)
RDW: 15.9 % — ABNORMAL HIGH (ref 11.5–15.5)
WBC: 5 10*3/uL (ref 4.0–10.5)
nRBC: 0 % (ref 0.0–0.2)

## 2021-11-24 NOTE — ED Notes (Signed)
Called x 3 NO answer 

## 2021-11-24 NOTE — ED Triage Notes (Signed)
Pt c/o bilateral leg swelling. Last dialysis Thursday.

## 2021-11-29 ENCOUNTER — Other Ambulatory Visit: Payer: Self-pay

## 2021-11-29 ENCOUNTER — Emergency Department (HOSPITAL_COMMUNITY): Payer: Commercial Managed Care - HMO

## 2021-11-29 ENCOUNTER — Emergency Department (HOSPITAL_COMMUNITY)
Admission: EM | Admit: 2021-11-29 | Discharge: 2021-11-29 | Disposition: A | Discharge status: Home / Self Care | Payer: Commercial Managed Care - HMO | Attending: Emergency Medicine | Admitting: Emergency Medicine

## 2021-11-29 DIAGNOSIS — R0602 Shortness of breath: Secondary | ICD-10-CM | POA: Diagnosis present

## 2021-11-29 DIAGNOSIS — N186 End stage renal disease: Secondary | ICD-10-CM | POA: Insufficient documentation

## 2021-11-29 DIAGNOSIS — Z79899 Other long term (current) drug therapy: Secondary | ICD-10-CM | POA: Insufficient documentation

## 2021-11-29 DIAGNOSIS — E1122 Type 2 diabetes mellitus with diabetic chronic kidney disease: Secondary | ICD-10-CM | POA: Insufficient documentation

## 2021-11-29 DIAGNOSIS — I509 Heart failure, unspecified: Secondary | ICD-10-CM | POA: Insufficient documentation

## 2021-11-29 DIAGNOSIS — I132 Hypertensive heart and chronic kidney disease with heart failure and with stage 5 chronic kidney disease, or end stage renal disease: Secondary | ICD-10-CM | POA: Diagnosis not present

## 2021-11-29 DIAGNOSIS — Z794 Long term (current) use of insulin: Secondary | ICD-10-CM | POA: Diagnosis not present

## 2021-11-29 DIAGNOSIS — Z992 Dependence on renal dialysis: Secondary | ICD-10-CM | POA: Insufficient documentation

## 2021-11-29 DIAGNOSIS — D631 Anemia in chronic kidney disease: Secondary | ICD-10-CM | POA: Diagnosis not present

## 2021-11-29 DIAGNOSIS — J810 Acute pulmonary edema: Secondary | ICD-10-CM | POA: Diagnosis not present

## 2021-11-29 DIAGNOSIS — J9601 Acute respiratory failure with hypoxia: Secondary | ICD-10-CM | POA: Diagnosis not present

## 2021-11-29 LAB — BASIC METABOLIC PANEL
Anion gap: 13 (ref 5–15)
BUN: 43 mg/dL — ABNORMAL HIGH (ref 6–20)
CO2: 26 mmol/L (ref 22–32)
Calcium: 7.8 mg/dL — ABNORMAL LOW (ref 8.9–10.3)
Chloride: 100 mmol/L (ref 98–111)
Creatinine, Ser: 7.33 mg/dL — ABNORMAL HIGH (ref 0.61–1.24)
GFR, Estimated: 9 mL/min — ABNORMAL LOW (ref >60)
Glucose, Bld: 186 mg/dL — ABNORMAL HIGH (ref 70–99)
Potassium: 4 mmol/L (ref 3.5–5.1)
Sodium: 139 mmol/L (ref 135–145)

## 2021-11-29 LAB — CBC WITH DIFFERENTIAL/PLATELET
Abs Immature Granulocytes: 0.03 10*3/uL (ref 0.00–0.07)
Basophils Absolute: 0 10*3/uL (ref 0.0–0.1)
Basophils Relative: 1 %
Eosinophils Absolute: 0.2 10*3/uL (ref 0.0–0.5)
Eosinophils Relative: 4 %
HCT: 31.7 % — ABNORMAL LOW (ref 39.0–52.0)
Hemoglobin: 9.7 g/dL — ABNORMAL LOW (ref 13.0–17.0)
Immature Granulocytes: 1 %
Lymphocytes Relative: 19 %
Lymphs Abs: 1.1 10*3/uL (ref 0.7–4.0)
MCH: 28.9 pg (ref 26.0–34.0)
MCHC: 30.6 g/dL (ref 30.0–36.0)
MCV: 94.3 fL (ref 80.0–100.0)
Monocytes Absolute: 0.8 10*3/uL (ref 0.1–1.0)
Monocytes Relative: 14 %
Neutro Abs: 3.5 10*3/uL (ref 1.7–7.7)
Neutrophils Relative %: 61 %
Platelets: 276 10*3/uL (ref 150–400)
RBC: 3.36 MIL/uL — ABNORMAL LOW (ref 4.22–5.81)
RDW: 15.9 % — ABNORMAL HIGH (ref 11.5–15.5)
WBC: 5.6 10*3/uL (ref 4.0–10.5)
nRBC: 0 % (ref 0.0–0.2)

## 2021-11-29 LAB — HEPATITIS C ANTIBODY: HCV Ab: NONREACTIVE

## 2021-11-29 LAB — HEPATITIS B SURFACE ANTIBODY,QUALITATIVE: Hep B S Ab: NONREACTIVE

## 2021-11-29 LAB — HEPATITIS B CORE ANTIBODY, TOTAL: Hep B Core Total Ab: NONREACTIVE

## 2021-11-29 LAB — HEPATITIS B SURFACE ANTIGEN: Hepatitis B Surface Ag: NONREACTIVE

## 2021-11-29 MED ORDER — ALTEPLASE 2 MG IJ SOLR: 2.0000 mg | Freq: Once | INTRAMUSCULAR | Status: DC | PRN

## 2021-11-29 MED ORDER — ANTICOAGULANT SODIUM CITRATE 4% (200MG/5ML) IV SOLN: 5.0000 mL | Status: DC | PRN

## 2021-11-29 MED ORDER — CHLORHEXIDINE GLUCONATE CLOTH 2 % EX PADS: 6.0000 | MEDICATED_PAD | Freq: Every day | CUTANEOUS | Status: DC

## 2021-11-29 MED ORDER — PENTAFLUOROPROP-TETRAFLUOROETH EX AERO: 1.0000 | INHALATION_SPRAY | CUTANEOUS | Status: DC | PRN

## 2021-11-29 MED ORDER — LIDOCAINE HCL (PF) 1 % IJ SOLN: 5.0000 mL | INTRAMUSCULAR | Status: DC | PRN

## 2021-11-29 MED ORDER — LIDOCAINE-PRILOCAINE 2.5-2.5 % EX CREA: 1.0000 | TOPICAL_CREAM | CUTANEOUS | Status: DC | PRN

## 2021-11-29 MED ORDER — HEPARIN SODIUM (PORCINE) 1000 UNIT/ML DIALYSIS: 1000.0000 [IU] | INTRAMUSCULAR | Status: DC | PRN

## 2021-11-29 NOTE — ED Provider Notes (Signed)
[facility] EMERGENCY DEPARTMENT Provider Note   CSN: [id number] Arrival date & time: 11/29/21  0130     History  Chief Complaint  Patient presents with   Shortness of Breath    [name] is a 51 y.o. male.  The history is provided by the patient.  Shortness of Breath He has history of hypertension, diabetes, hyperlipidemia, end-stage renal disease on hemodialysis, heart failure and comes in because of shortness of breath which started tonight.  He has dialysis every Tuesday-Thursday-Saturday and states that he had his normal dialysis session on 11/27/2021.  He does admit to eating food with too much salt since then.  He denies chest pain, heaviness, tightness, pressure.  Of note, EMS reports oxygen saturation of 88% on arrival, which improved 100% with when he was put on nasal oxygen.   Home Medications Prior to Admission medications   Medication Sig Start Date End Date Taking? Authorizing Provider  amLODipine (NORVASC) 10 MG tablet Take 1 tablet by mouth once a day 11/20/21     amoxicillin-clavulanate (AUGMENTIN) 500-125 MG tablet Take 1 tablet (500 mg total) by mouth daily. 11/08/21   [name], MD  glucose blood test strip Use to check fasting blood sugar once daily. 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 Patient not taking: Reported on 09/28/2021 02/26/21   [name], [name], MD  insulin glargine (LANTUS) 100 UNIT/ML Solostar Pen Inject 10 Units into the skin daily. May substitute with any other long-acting formulary. Patient not taking: Reported on 09/28/2021 05/13/21   [name], MD  Insulin Pen Needle 32G X 4 MM MISC Use with insulin pens 02/26/21   [name], [name], MD  RENVELA 800 MG tablet Take 800-1,600 mg by mouth See admin instructions. Take 2 tablets (1600 mg) three times a day with meals  and 1 tablet (800 mg) twice a day as needed with snacks Patient not taking: Reported on 11/06/2021 10/25/21   [provider]  sevelamer carbonate (RENVELA) 800 MG tablet Take 2 tablet by mouth three times a day with meals and 1 tablet twice daily as needed with snacks 11/20/21     TRUEplus Lancets 28G MISC USE TO MEASURE BLOOD SUGAR TWICE A DAY 11/29/20 11/29/21  [name], MD  gabapentin (NEURONTIN) 300 MG capsule Take 1 capsule (300 mg total) by mouth 3 (three) times daily. Patient not taking: Reported on 07/27/2019 07/29/18 08/30/19  [name], FNP  insulin detemir (LEVEMIR FLEXTOUCH) 100 UNIT/ML FlexPen Inject 35 Units into the skin daily. Patient not taking: Reported on 02/17/2021 11/29/20 02/24/21  [name], MD  sildenafil (VIAGRA) 25 MG tablet Take 1 tablet (25 mg total) by mouth as needed for erectile dysfunction. for erectile dysfunction. Patient not taking: Reported on 08/11/2020 10/13/19 11/27/20  [name], FNP  sitaGLIPtin (JANUVIA) 25 MG tablet Take 1 tablet (25 mg total) by mouth daily. Patient not taking: Reported on 08/11/2020 02/22/20 11/27/20  [name], MD      Allergies    Sulfa antibiotics, Keflex [cephalexin], Other, and Bactrim [sulfamethoxazole-trimethoprim]    Review of Systems   Review of Systems  Respiratory:  Positive for shortness of breath.   All other systems reviewed and are negative.   Physical Exam Updated Vital Signs BP (!) 175/88   Pulse 90   Temp  98.4 F (36.9 C) (Oral)   Resp 16   Ht 5\' 7"  (1.702 m)   Wt 73.5 kg   SpO2 (S) 98% Comment: 2L [place]  BMI 25.37 kg/m  Physical Exam Vitals and nursing note reviewed.   51 year old male, resting comfortably and in no acute distress. Vital signs are significant for elevated blood pressure. Oxygen saturation is 98%, which is normal. Head is normocephalic and atraumatic. PERRLA, EOMI. Oropharynx is clear. Neck is nontender and supple without adenopathy or JVD. Back is  nontender and there is no CVA tenderness. Lungs are clear without rales, wheezes, or rhonchi. Chest is nontender. Heart has regular rate and rhythm without murmur. Abdomen is soft, flat, nontender. Extremities have 2+ pretibial edema, full range of motion is present.  AV fistula is present in the left arm with thrill present. Skin is warm and dry without rash. Neurologic: Mental status is normal, cranial nerves are intact, moves all extremities equally.  ED Results / Procedures / Treatments   Labs (all labs ordered are listed, but only abnormal results are displayed) Labs Reviewed  BASIC METABOLIC PANEL - Abnormal; Notable for the following components:      Result Value   Glucose, Bld 186 (*)    BUN 43 (*)    Creatinine, Ser 7.33 (*)    Calcium 7.8 (*)    GFR, Estimated 9 (*)    All other components within normal limits  CBC WITH DIFFERENTIAL/PLATELET - Abnormal; Notable for the following components:   RBC 3.36 (*)    Hemoglobin 9.7 (*)    HCT 31.7 (*)    RDW 15.9 (*)    All other components within normal limits    EKG EKG Interpretation  Date/Time:  Thursday November 29 2021 02:56:07 EDT Ventricular Rate:  87 PR Interval:  144 QRS Duration: 159 QT Interval:  451 QTC Calculation: 543 R Axis:   21 Text Interpretation: Sinus rhythm Left bundle branch block When compared with ECG of 11/23/2021, No significant change was found Confirmed by Delora Fuel ([id number]) on 11/29/2021 3:24:20 AM  Radiology DG Chest Port 1 View  Result Date: 11/29/2021 CLINICAL DATA:  Shortness of breath EXAM: PORTABLE CHEST 1 VIEW COMPARISON:  11/23/2021 FINDINGS: Cardiomegaly. Bilateral airspace disease, likely pulmonary edema. No effusions or acute bony abnormality. IMPRESSION: Mild CHF. Electronically Signed   By: Rolm Baptise M.D.   On: 11/29/2021 02:56    Procedures Procedures  Cardiac monitor shows normal sinus rhythm, per my interpretation.  Medications Ordered in ED Medications - No data to  display  ED Course/ Medical Decision Making/ A&P                           Medical Decision Making Amount and/or Complexity of Data Reviewed Labs: ordered. Radiology: ordered.   Shortness of breath which is most likely secondary to fluid overload.  Doubt pneumonia, pulmonary embolism, pneumothorax.  Old records are reviewed, and he has numerous ED visits for fluid overload.  On 11/05/2021, he was admitted to the hospital for emergent dialysis secondary to fluid overload, also on 10/13/2021, 09/28/2021 and several other occasions.  I have ordered laboratory tests of CBC, basic metabolic panel.  I have ordered ECG to evaluate for hyperkalemia, and chest x-ray to evaluate for pulmonary edema.  Chest x-ray shows pulmonary edema.  I have independently viewed the image, and agree with the radiologist's interpretation.  I have independently viewed and interpreted the ECG, and my  interpretation is left bundle branch block, no changes of hyperkalemia, unchanged from prior.  I have reviewed and interpreted all of the laboratory tests, my interpretation is chronic renal failure, no hyperkalemia, anemia of renal failure.  Hemoglobin is lower than last hemoglobin on record, but well within the range that his hemoglobin has been at in the past.  He will need dialysis on an urgent, but not emergent basis.  Case is discussed with Dr. Hollie Salk, on-call for nephrology, who agrees to arrange for dialysis.   Final Clinical Impression(s) / ED Diagnoses Final diagnoses:  Acute respiratory failure with hypoxemia ([place])  Acute pulmonary edema ([facility])  End-stage renal disease on hemodialysis ([place])  Anemia associated with chronic renal failure    Rx / DC Orders ED Discharge Orders     None         Delora Fuel, MD 34/75/83 [phone]

## 2021-11-29 NOTE — ED Notes (Signed)
Pt removing himself from cardiac monitor, this RN educated importance of keeping leads on.

## 2021-11-29 NOTE — ED Notes (Signed)
Patient was transported back from dialysis was acting out in dialysis and refused. , upon arrival back to ED states he didn't want to  have dialysis here, he wanted to go to the clinic and wanted to leave, he signed  out [name]

## 2021-11-29 NOTE — Progress Notes (Signed)
Noted that pt left AMA from ED. Today is pt's normal HD day. Contacted [place] and spoke to [facility] to make clinic aware pt left ED AMA and may come to clinic for regular treatment.   Melven Sartorius Renal Navigator [phone]

## 2021-11-29 NOTE — ED Triage Notes (Signed)
Pt BIB [facility] c/o [facility]. Tu/Th/Sat HD, has not missed any recent dialysis tx. Pt 88% RA with EMS, 100% on 2L [place].

## 2021-11-29 NOTE — ED Notes (Signed)
Pt refused v/s before transport expected to bring him to hemodialysis.

## 2021-11-29 NOTE — Progress Notes (Addendum)
Once arrived in the unit ,Patient's pulled his electrodes and said'' I dont to have my hemodialysis treatment here,I am going to my hemodialysis center. Renal NP at the bedside and aware.A.D at the bedside.HD treatment was not started.Patient send back to E.D.

## 2021-11-30 LAB — HEPATITIS B SURFACE ANTIBODY, QUANTITATIVE: Hep B S AB Quant (Post): <3.1 m[IU]/mL — ABNORMAL LOW (ref >9.9)

## 2021-12-03 ENCOUNTER — Encounter (HOSPITAL_COMMUNITY): Payer: Self-pay | Admitting: Emergency Medicine

## 2021-12-03 ENCOUNTER — Emergency Department (HOSPITAL_COMMUNITY): Payer: Commercial Managed Care - HMO

## 2021-12-03 ENCOUNTER — Emergency Department (HOSPITAL_COMMUNITY)
Admission: EM | Admit: 2021-12-03 | Discharge: 2021-12-04 | Disposition: A | Discharge status: Home / Self Care | Payer: Commercial Managed Care - HMO | Attending: Emergency Medicine | Admitting: Emergency Medicine

## 2021-12-03 ENCOUNTER — Encounter: Payer: Self-pay | Admitting: *Deleted

## 2021-12-03 ENCOUNTER — Other Ambulatory Visit: Payer: Self-pay

## 2021-12-03 DIAGNOSIS — R778 Other specified abnormalities of plasma proteins: Secondary | ICD-10-CM | POA: Insufficient documentation

## 2021-12-03 DIAGNOSIS — I5020 Unspecified systolic (congestive) heart failure: Secondary | ICD-10-CM | POA: Insufficient documentation

## 2021-12-03 DIAGNOSIS — Z992 Dependence on renal dialysis: Secondary | ICD-10-CM

## 2021-12-03 DIAGNOSIS — E1122 Type 2 diabetes mellitus with diabetic chronic kidney disease: Secondary | ICD-10-CM | POA: Diagnosis not present

## 2021-12-03 DIAGNOSIS — Z7984 Long term (current) use of oral hypoglycemic drugs: Secondary | ICD-10-CM | POA: Insufficient documentation

## 2021-12-03 DIAGNOSIS — I132 Hypertensive heart and chronic kidney disease with heart failure and with stage 5 chronic kidney disease, or end stage renal disease: Secondary | ICD-10-CM | POA: Diagnosis not present

## 2021-12-03 DIAGNOSIS — N186 End stage renal disease: Secondary | ICD-10-CM | POA: Diagnosis not present

## 2021-12-03 DIAGNOSIS — I770 Arteriovenous fistula, acquired: Secondary | ICD-10-CM | POA: Diagnosis not present

## 2021-12-03 DIAGNOSIS — R0602 Shortness of breath: Secondary | ICD-10-CM | POA: Diagnosis present

## 2021-12-03 DIAGNOSIS — Z794 Long term (current) use of insulin: Secondary | ICD-10-CM | POA: Diagnosis not present

## 2021-12-03 DIAGNOSIS — R7989 Other specified abnormal findings of blood chemistry: Secondary | ICD-10-CM | POA: Insufficient documentation

## 2021-12-03 DIAGNOSIS — Z79899 Other long term (current) drug therapy: Secondary | ICD-10-CM | POA: Insufficient documentation

## 2021-12-03 LAB — CBC WITH DIFFERENTIAL/PLATELET
Abs Immature Granulocytes: 0.03 10*3/uL (ref 0.00–0.07)
Basophils Absolute: 0 10*3/uL (ref 0.0–0.1)
Basophils Relative: 0 %
Eosinophils Absolute: 0.2 10*3/uL (ref 0.0–0.5)
Eosinophils Relative: 4 %
HCT: 35.7 % — ABNORMAL LOW (ref 39.0–52.0)
Hemoglobin: 11 g/dL — ABNORMAL LOW (ref 13.0–17.0)
Immature Granulocytes: 1 %
Lymphocytes Relative: 16 %
Lymphs Abs: 1 10*3/uL (ref 0.7–4.0)
MCH: 29.3 pg (ref 26.0–34.0)
MCHC: 30.8 g/dL (ref 30.0–36.0)
MCV: 94.9 fL (ref 80.0–100.0)
Monocytes Absolute: 0.8 10*3/uL (ref 0.1–1.0)
Monocytes Relative: 14 %
Neutro Abs: 4 10*3/uL (ref 1.7–7.7)
Neutrophils Relative %: 65 %
Platelets: 298 10*3/uL (ref 150–400)
RBC: 3.76 MIL/uL — ABNORMAL LOW (ref 4.22–5.81)
RDW: 16.2 % — ABNORMAL HIGH (ref 11.5–15.5)
WBC: 6 10*3/uL (ref 4.0–10.5)
nRBC: 0 % (ref 0.0–0.2)

## 2021-12-03 LAB — BASIC METABOLIC PANEL
Anion gap: 12 (ref 5–15)
BUN: 46 mg/dL — ABNORMAL HIGH (ref 6–20)
CO2: 24 mmol/L (ref 22–32)
Calcium: 7.6 mg/dL — ABNORMAL LOW (ref 8.9–10.3)
Chloride: 99 mmol/L (ref 98–111)
Creatinine, Ser: 8.57 mg/dL — ABNORMAL HIGH (ref 0.61–1.24)
GFR, Estimated: 7 mL/min — ABNORMAL LOW (ref >60)
Glucose, Bld: 238 mg/dL — ABNORMAL HIGH (ref 70–99)
Potassium: 4.3 mmol/L (ref 3.5–5.1)
Sodium: 135 mmol/L (ref 135–145)

## 2021-12-03 LAB — TROPONIN I (HIGH SENSITIVITY): Troponin I (High Sensitivity): 154 ng/L (ref <18)

## 2021-12-03 NOTE — Discharge Instructions (Signed)
You are seen in the emergency department for evaluation of shortness of breath.  Your lab work and imaging studies show signs of fluid overload however given your dialysis appointment is scheduled for tomorrow we recommend that you attend this dialysis appointment in its entirety.  We also recommend following up with your primary care provider listed above in the coming days for symptom recheck.

## 2021-12-03 NOTE — Congregational Nurse Program (Signed)
  Dept: [phone]   Congregational Nurse Program Note  Date of Encounter: 12/03/2021  Past Medical History: Past Medical History:  Diagnosis Date   Anxiety 02/2019   Anxiety 10/2019   Diabetes mellitus type II, uncontrolled 07/17/2006   Elevated alkaline phosphatase level 01/2019   ESSENTIAL HYPERTENSION 07/17/2006   Homelessness 10/13/2021   HYPERLIPIDEMIA 11/24/2008   Insomnia 02/2019   Left bundle branch block 02/22/2019   Lesion of lip 10/2019   Pneumonia 10/22/2011   Suicidal ideations 02/2019   Vitamin D deficiency 01/2019    Encounter Details:  CNP Questionnaire - 12/03/21 1323       Questionnaire   Do you give verbal consent to treat you today? Yes    Location Patient Served  [facility]    Visit Setting Church or Organization;Phone/Text/Email    Patient Status Homeless    Insurance [employer] Referral Medicaid    Medication N/A    Medical Provider No    Screening Referrals N/A    Medical Referral [facility] P[facility]Clinic    Medical Appointment Made Non-[facility] PCP/clinic    Food N/A    Transportation N/A    Housing/Utilities No permanent housing    Interpersonal Safety N/A    Intervention [place]    ED Visit Averted N/A    Life-Saving Intervention Made N/A            Client seen at [facility] as a follow up from multiple ED visits. Client reports he is homeless sleeping on a park bench last night and has dialysis three times a week. He reports having medicaid, process of applying for disability and no PCP. Contacted [facility] and client is on housing waiting lists. Client reports multiple losses including his wife last Sept he was separated from and his mother a few months ago. He denies si and hi. Client left IRC and reports he will be back in the afternoon. Made an appt with [facility] per client request on [address] for July 24th at 2:25 with Dr Carney Bern to establish PCP. Have blanket, pallet and toiletries ready for pick up when  client returns. Bekki Tavenner W RN CN

## 2021-12-03 NOTE — ED Provider Notes (Signed)
[facility] EMERGENCY DEPARTMENT Provider Note   CSN: [id number] Arrival date & time: 12/03/21  2017     History  Chief Complaint  Patient presents with   Shortness of Breath    BUEL LEMM is a 51 y.o. male.   Shortness of Breath  Patient is a 51 year old male with a history of homelessness, HTN, HLD, DM 2, known LBBB, HFrEF (EF 30 to 35% 11/2020), ESRD on HD TTS who presents emergency department for evaluation of shortness of breath.  According to the patient this evening he had onset of shortness of breath which has been progressively worsening.  He left his most recent dialysis session this past Saturday 30 minutes early due to him having to go to the bathroom.  Per old records patient has been evaluated in this emergency department multiple times in the setting of shortness of breath from fluid overload.  Patient does admit that in the setting of his homelessness he just needs somewhere to go.  He denies any current chest pain.  He denies any headaches or double/blurred vision.  He denies any nausea, vomiting, diarrhea.     Home Medications Prior to Admission medications   Medication Sig Start Date End Date Taking? Authorizing Provider  amLODipine (NORVASC) 10 MG tablet Take 1 tablet by mouth once a day 11/20/21     amoxicillin-clavulanate (AUGMENTIN) 500-125 MG tablet Take 1 tablet (500 mg total) by mouth daily. 11/08/21   Standley Brooking, MD  glucose blood test strip Use to check fasting blood sugar once daily. 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 Insulin dependent 11/29/20   Hollice Espy, MD  insulin aspart (NOVOLOG FLEXPEN) 100 UNIT/ML FlexPen Inject 5 Units into the skin 2 (two) times daily with a meal. Patient not taking: Reported on 09/28/2021 02/26/21   [name], Leana Roe, MD  insulin glargine (LANTUS) 100 UNIT/ML Solostar Pen Inject 10 Units into the skin daily. May substitute with any other long-acting formulary. Patient not taking: Reported on 09/28/2021  05/13/21   Hughie Closs, MD  Insulin Pen Needle 32G X 4 MM MISC Use with insulin pens 02/26/21   [name], Leana Roe, MD  RENVELA 800 MG tablet Take 800-1,600 mg by mouth See admin instructions. Take 2 tablets (1600 mg) three times a day with meals and 1 tablet (800 mg) twice a day as needed with snacks Patient not taking: Reported on 11/06/2021 10/25/21   [provider]  sevelamer carbonate (RENVELA) 800 MG tablet Take 2 tablet by mouth three times a day with meals and 1 tablet twice daily as needed with snacks 11/20/21     gabapentin (NEURONTIN) 300 MG capsule Take 1 capsule (300 mg total) by mouth 3 (three) times daily. Patient not taking: Reported on 07/27/2019 07/29/18 08/30/19  Kallie Locks, FNP  insulin detemir (LEVEMIR FLEXTOUCH) 100 UNIT/ML FlexPen Inject 35 Units into the skin daily. Patient not taking: Reported on 02/17/2021 11/29/20 02/24/21  Hollice Espy, MD  sildenafil (VIAGRA) 25 MG tablet Take 1 tablet (25 mg total) by mouth as needed for erectile dysfunction. for erectile dysfunction. Patient not taking: Reported on 08/11/2020 10/13/19 11/27/20  Kallie Locks, FNP  sitaGLIPtin (JANUVIA) 25 MG tablet Take 1 tablet (25 mg total) by mouth daily. Patient not taking: Reported on 08/11/2020 02/22/20 11/27/20  Storm Frisk, MD      Allergies    Sulfa antibiotics, Keflex [cephalexin], Other, and Bactrim [sulfamethoxazole-trimethoprim]    Review of Systems   Review of Systems  Respiratory:  Positive for shortness of breath.     Physical Exam Updated Vital Signs BP (!) 158/68 (BP Location: Left Arm)   Pulse 94   Resp 20   SpO2 99%  Physical Exam Vitals and nursing note reviewed.  Constitutional:      General: He is not in acute distress.    Appearance: He is well-developed.  HENT:     Head: Atraumatic.     Right Ear: External ear normal.     Left Ear: External ear normal.     Nose: Nose normal.     Mouth/Throat:     Mouth: Mucous membranes are moist.      Pharynx: Oropharynx is clear.  Eyes:     Extraocular Movements: Extraocular movements intact.     Conjunctiva/sclera: Conjunctivae normal.     Pupils: Pupils are equal, round, and reactive to light.  Cardiovascular:     Rate and Rhythm: Normal rate and regular rhythm.     Heart sounds: No murmur heard. Pulmonary:     Effort: Pulmonary effort is normal. No respiratory distress.     Breath sounds: Rales present.     Comments: Minimal rales in lung bases Abdominal:     Palpations: Abdomen is soft.     Tenderness: There is no abdominal tenderness. There is no guarding or rebound.  Musculoskeletal:        General: No swelling.     Cervical back: Neck supple.     Right lower leg: No edema.     Left lower leg: No edema.     Comments: Palpable thrill RUE fistula   Lymphadenopathy:     Cervical: No cervical adenopathy.  Skin:    General: Skin is warm and dry.     Capillary Refill: Capillary refill takes less than 2 seconds.     Findings: No rash.  Neurological:     General: No focal deficit present.     Mental Status: He is alert and oriented to person, place, and time. Mental status is at baseline.  Psychiatric:        Mood and Affect: Mood normal.     ED Results / Procedures / Treatments   Labs (all labs ordered are listed, but only abnormal results are displayed) Labs Reviewed  CBC WITH DIFFERENTIAL/PLATELET - Abnormal; Notable for the following components:      Result Value   RBC 3.76 (*)    Hemoglobin 11.0 (*)    HCT 35.7 (*)    RDW 16.2 (*)    All other components within normal limits  BASIC METABOLIC PANEL - Abnormal; Notable for the following components:   Glucose, Bld 238 (*)    BUN 46 (*)    Creatinine, Ser 8.57 (*)    Calcium 7.6 (*)    GFR, Estimated 7 (*)    All other components within normal limits  TROPONIN I (HIGH SENSITIVITY) - Abnormal; Notable for the following components:   Troponin I (High Sensitivity) 154 (*)    All other components within normal  limits  TROPONIN I (HIGH SENSITIVITY)    EKG EKG Interpretation  Date/Time:  Monday December 03 2021 20:37:00 EDT Ventricular Rate:  88 PR Interval:  140 QRS Duration: 148 QT Interval:  430 QTC Calculation: 520 R Axis:   242 Text Interpretation: Normal sinus rhythm Non-specific intra-ventricular conduction block Minimal voltage criteria for LVH, may be normal variant ( Cornell product ) Possible Lateral infarct , age undetermined Abnormal ECG When compared with ECG of 29-Nov-2021  02:56, PREVIOUS ECG IS PRESENT Confirmed by Richardean Canal ([id number]) on 12/03/2021 10:08:03 PM  Radiology DG Chest 1 View  Result Date: 12/03/2021 CLINICAL DATA:  Shortness of breath EXAM: CHEST  1 VIEW COMPARISON:  11/29/2021, CT 5 08/06/2021 FINDINGS: Cardiomegaly with vascular congestion and diffuse bilateral interstitial and ground-glass opacities suspicious for pulmonary edema. No pleural effusion or pneumothorax IMPRESSION: Cardiomegaly with vascular congestion and mild diffuse interstitial and ground-glass opacity suspect for pulmonary edema Electronically Signed   By: Jasmine Pang M.D.   On: 12/03/2021 21:53    Procedures Procedures    Medications Ordered in ED Medications - No data to display  ED Course/ Medical Decision Making/ A&P                           Medical Decision Making Problems Addressed: Dialysis patient [facility]): chronic illness or injury Shortness of breath: complicated acute illness or injury with systemic symptoms that poses a threat to life or bodily functions  Amount and/or Complexity of Data Reviewed External Data Reviewed: labs, radiology, ECG and notes. Labs: ordered. Radiology: ordered. ECG/medicine tests: ordered and independent interpretation performed.    Details: Evidence of left bundle branch block without meeting Sgarbossa's criteria.  Rate 88, QTc 520   Patient is a 51 year old male who presents emergency department as above.  On initial presentation patient's vital  signs are stable and he is afebrile.  On physical exam he does have some crackles in his lung bases.  He reports feeling subjective shortness of breath and is concerned that he is fluid overloaded.  Patient has been seen in the emergency department multiple times recently in the setting of fluid overload.  He did leave his most recent dialysis session 30 minutes early and see independent medical bathroom.  Baseline laboratory work and imaging studies will be ordered for this patient.  BMP showed elevated creatinine and BUN but was otherwise grossly unremarkable.  Troponin elevated at 154, not significantly changed from his baseline.  White blood cell count normal at 6.  Hemoglobin stable at 11.  Chest x-ray showed cardiomegaly with vascular congestion and diffuse bilateral interstitial and groundglass opacities suspicious for pulmonary edema.    Patient is continued to saturate appropriately on room air and does not appear excessively tachypneic.  He is resting comfortably on the bed.  Given the patient's overall reassuring clinical presentation he is felt appropriate for further management in the outpatient setting.  He was instructed to attend his dialysis session tomorrow.  Plan and findings discussed with patient at bedside and he verbalized understanding was agreeable.  Patient discharged in the ED in stable condition.        Final Clinical Impression(s) / ED Diagnoses Final diagnoses:  Shortness of breath  Dialysis patient [facility])    Rx / DC Orders ED Discharge Orders     None         Tommie Raymond, MD 12/04/21 0203    Charlynne Pander, MD 12/04/21 1524

## 2021-12-08 ENCOUNTER — Encounter: Payer: Self-pay | Admitting: *Deleted

## 2021-12-08 NOTE — Congregational Nurse Program (Signed)
  Dept: [phone]   Congregational Nurse Program Note  Date of Encounter: 12/05/21  Past Medical History: Past Medical History:  Diagnosis Date   Anxiety 02/2019   Anxiety 10/2019   Diabetes mellitus type II, uncontrolled 07/17/2006   Elevated alkaline phosphatase level 01/2019   ESSENTIAL HYPERTENSION 07/17/2006   Homelessness 10/13/2021   HYPERLIPIDEMIA 11/24/2008   Insomnia 02/2019   Left bundle branch block 02/22/2019   Lesion of lip 10/2019   Pneumonia 10/22/2011   Suicidal ideations 02/2019   Vitamin D deficiency 01/2019    Encounter Details:  CNP Questionnaire - 12/05/21 1400       Questionnaire   Do you give verbal consent to treat you today? Yes    Location Patient Served  [facility]    Visit Setting Church or Organization;Phone/Text/Email    Patient Status Homeless    Insurance [employer] Referral N/A    Medication N/A    Medical Provider No    Screening Referrals N/A    Medical Referral N/A    Medical Appointment Made N/A    Food N/A    Transportation Need transportation assistance;Provided transportation assistance    Housing/Utilities No permanent housing    Interpersonal Safety N/A    Intervention [place]    ED Visit Averted N/A    Life-Saving Intervention Made N/A            Writer called client to remind him of upcoming appt with [facility]. Client then came to [facility] to pick up items including a mat for sleeping and blankets. Client was given referral form for [facility] with appt listed and bus passes for transportation. Anureet Bruington W RN CN

## 2021-12-10 ENCOUNTER — Other Ambulatory Visit: Payer: Self-pay

## 2021-12-10 ENCOUNTER — Emergency Department (HOSPITAL_COMMUNITY): Payer: Commercial Managed Care - HMO

## 2021-12-10 ENCOUNTER — Emergency Department (HOSPITAL_COMMUNITY)
Admission: EM | Admit: 2021-12-10 | Discharge: 2021-12-11 | Disposition: A | Discharge status: Home / Self Care | Payer: Commercial Managed Care - HMO | Attending: Student | Admitting: Student

## 2021-12-10 DIAGNOSIS — Z5321 Procedure and treatment not carried out due to patient leaving prior to being seen by health care provider: Secondary | ICD-10-CM | POA: Diagnosis not present

## 2021-12-10 DIAGNOSIS — R0602 Shortness of breath: Secondary | ICD-10-CM | POA: Insufficient documentation

## 2021-12-10 LAB — CBC WITH DIFFERENTIAL/PLATELET
Abs Immature Granulocytes: 0.02 10*3/uL (ref 0.00–0.07)
Basophils Absolute: 0 10*3/uL (ref 0.0–0.1)
Basophils Relative: 1 %
Eosinophils Absolute: 0.2 10*3/uL (ref 0.0–0.5)
Eosinophils Relative: 3 %
HCT: 33.6 % — ABNORMAL LOW (ref 39.0–52.0)
Hemoglobin: 10.6 g/dL — ABNORMAL LOW (ref 13.0–17.0)
Immature Granulocytes: 0 %
Lymphocytes Relative: 17 %
Lymphs Abs: 1 10*3/uL (ref 0.7–4.0)
MCH: 29.3 pg (ref 26.0–34.0)
MCHC: 31.5 g/dL (ref 30.0–36.0)
MCV: 92.8 fL (ref 80.0–100.0)
Monocytes Absolute: 0.7 10*3/uL (ref 0.1–1.0)
Monocytes Relative: 11 %
Neutro Abs: 4.2 10*3/uL (ref 1.7–7.7)
Neutrophils Relative %: 68 %
Platelets: 268 10*3/uL (ref 150–400)
RBC: 3.62 MIL/uL — ABNORMAL LOW (ref 4.22–5.81)
RDW: 16.1 % — ABNORMAL HIGH (ref 11.5–15.5)
WBC: 6.1 10*3/uL (ref 4.0–10.5)
nRBC: 0 % (ref 0.0–0.2)

## 2021-12-10 NOTE — ED Triage Notes (Addendum)
[place] here [facility] [place] [facility]. W/ ems pt's SpO2 was 88% on RA, ems put him on 4L and SpO2 increased to 100%. Pt is supposed to have O2 as needed but is homeless. 182/96, 90HR, 16RR, cbg 208. Pt gets dialysis T/Thurs/Sat, per pt he missed saturday

## 2021-12-10 NOTE — ED Provider Triage Note (Signed)
Emergency Medicine Provider Triage Evaluation Note  [name] , a 51 y.o. male  was evaluated in triage.  Pt complains of shortness of breath, going on today, states he was outside and cannot breathe, no associated chest pain nausea vomiting cough or congestion, last dialysis treatment was Thursday, unclear if he got his full treatment, he is unsure if he got his full treatment last Tuesday either.  He also says he has a rash on his chest, states he has had this for a long time, states it is itchy has no other complaints.  Review of Systems  Positive: Shortness of breath, rash Negative: Chest pain, cough  Physical Exam  BP (!) 185/98 (BP Location: Left Arm)   Pulse 86   Temp 98.1 F (36.7 C) (Oral)   Resp 14   SpO2 (!) 87%  Gen:   Awake, no distress   Resp:  Normal effort on 2 L via nasal cannula, no evidence of rest or distress, nontachypneic, nonhypoxic, speaking full sentences, bibasilar Rales. MSK:   Moves extremities without difficulty  Other:    Medical Decision Making  Medically screening exam initiated at 10:18 PM.  Appropriate orders placed.  [name] was informed that the remainder of the evaluation will be completed by another provider, this initial triage assessment does not replace that evaluation, and the importance of remaining in the ED until their evaluation is complete.  Shortness of breath, lab work imaging been ordered, will need further work-up.   Marcello Fennel, PA-C 12/10/21 2220

## 2021-12-11 LAB — BRAIN NATRIURETIC PEPTIDE: B Natriuretic Peptide: >4500 pg/mL — ABNORMAL HIGH (ref 0.0–100.0)

## 2021-12-11 LAB — COMPREHENSIVE METABOLIC PANEL
ALT: 38 U/L (ref 0–44)
AST: 31 U/L (ref 15–41)
Albumin: 2.7 g/dL — ABNORMAL LOW (ref 3.5–5.0)
Alkaline Phosphatase: 337 U/L — ABNORMAL HIGH (ref 38–126)
Anion gap: 12 (ref 5–15)
BUN: 66 mg/dL — ABNORMAL HIGH (ref 6–20)
CO2: 23 mmol/L (ref 22–32)
Calcium: 7.3 mg/dL — ABNORMAL LOW (ref 8.9–10.3)
Chloride: 100 mmol/L (ref 98–111)
Creatinine, Ser: 9.68 mg/dL — ABNORMAL HIGH (ref 0.61–1.24)
GFR, Estimated: 6 mL/min — ABNORMAL LOW (ref >60)
Glucose, Bld: 217 mg/dL — ABNORMAL HIGH (ref 70–99)
Potassium: 4.6 mmol/L (ref 3.5–5.1)
Sodium: 135 mmol/L (ref 135–145)
Total Bilirubin: 0.7 mg/dL (ref 0.3–1.2)
Total Protein: 6.7 g/dL (ref 6.5–8.1)

## 2021-12-11 LAB — MAGNESIUM: Magnesium: 2.5 mg/dL — ABNORMAL HIGH (ref 1.7–2.4)

## 2021-12-11 NOTE — ED Notes (Signed)
Pt left stated he has to go to dialysis

## 2021-12-12 ENCOUNTER — Other Ambulatory Visit: Payer: Self-pay

## 2021-12-12 ENCOUNTER — Encounter (HOSPITAL_COMMUNITY): Payer: Self-pay | Admitting: *Deleted

## 2021-12-12 ENCOUNTER — Emergency Department (HOSPITAL_COMMUNITY)
Admission: EM | Admit: 2021-12-12 | Discharge: 2021-12-12 | Discharge status: Against Medical Advice | Payer: Commercial Managed Care - HMO | Attending: Emergency Medicine | Admitting: Emergency Medicine

## 2021-12-12 DIAGNOSIS — Z992 Dependence on renal dialysis: Secondary | ICD-10-CM | POA: Insufficient documentation

## 2021-12-12 DIAGNOSIS — R0602 Shortness of breath: Secondary | ICD-10-CM | POA: Insufficient documentation

## 2021-12-12 DIAGNOSIS — Z5321 Procedure and treatment not carried out due to patient leaving prior to being seen by health care provider: Secondary | ICD-10-CM | POA: Diagnosis not present

## 2021-12-12 LAB — CBC
HCT: 33.8 % — ABNORMAL LOW (ref 39.0–52.0)
Hemoglobin: 10.7 g/dL — ABNORMAL LOW (ref 13.0–17.0)
MCH: 29.6 pg (ref 26.0–34.0)
MCHC: 31.7 g/dL (ref 30.0–36.0)
MCV: 93.4 fL (ref 80.0–100.0)
Platelets: 278 10*3/uL (ref 150–400)
RBC: 3.62 MIL/uL — ABNORMAL LOW (ref 4.22–5.81)
RDW: 16.2 % — ABNORMAL HIGH (ref 11.5–15.5)
WBC: 4.7 10*3/uL (ref 4.0–10.5)
nRBC: 0 % (ref 0.0–0.2)

## 2021-12-12 LAB — BASIC METABOLIC PANEL
Anion gap: 11 (ref 5–15)
BUN: 35 mg/dL — ABNORMAL HIGH (ref 6–20)
CO2: 27 mmol/L (ref 22–32)
Calcium: 7.3 mg/dL — ABNORMAL LOW (ref 8.9–10.3)
Chloride: 101 mmol/L (ref 98–111)
Creatinine, Ser: 6.44 mg/dL — ABNORMAL HIGH (ref 0.61–1.24)
GFR, Estimated: 10 mL/min — ABNORMAL LOW (ref >60)
Glucose, Bld: 145 mg/dL — ABNORMAL HIGH (ref 70–99)
Potassium: 3.9 mmol/L (ref 3.5–5.1)
Sodium: 139 mmol/L (ref 135–145)

## 2021-12-12 NOTE — ED Triage Notes (Signed)
Pt states he is "short of breath every day when its hot outside". Dry cough. No acute distress. Also says he has bumps all over his body "for a long time" that itch. Denies any pain.

## 2021-12-12 NOTE — ED Notes (Signed)
Called patient with no response.

## 2021-12-12 NOTE — ED Triage Notes (Signed)
[place] [facility] [place]. Per EMS report, C/o shortness of breath. Went dialysis yesterday. 95% ra, 148/96, hr 84. Denies pain.

## 2021-12-12 NOTE — ED Notes (Signed)
Patient seen leaving ED with all his belongings

## 2021-12-21 ENCOUNTER — Encounter (HOSPITAL_COMMUNITY): Payer: Self-pay | Admitting: Emergency Medicine

## 2021-12-21 ENCOUNTER — Other Ambulatory Visit: Payer: Self-pay

## 2021-12-21 ENCOUNTER — Emergency Department (HOSPITAL_COMMUNITY)
Admission: EM | Admit: 2021-12-21 | Discharge: 2021-12-21 | Discharge status: Against Medical Advice | Payer: Commercial Managed Care - HMO | Attending: Emergency Medicine | Admitting: Emergency Medicine

## 2021-12-21 DIAGNOSIS — L299 Pruritus, unspecified: Secondary | ICD-10-CM | POA: Diagnosis not present

## 2021-12-21 DIAGNOSIS — Z5321 Procedure and treatment not carried out due to patient leaving prior to being seen by health care provider: Secondary | ICD-10-CM | POA: Diagnosis not present

## 2021-12-21 DIAGNOSIS — R21 Rash and other nonspecific skin eruption: Secondary | ICD-10-CM | POA: Insufficient documentation

## 2021-12-21 NOTE — ED Triage Notes (Signed)
Pt with initial complaint of left small finger itching.  Pt has notable rash to chest and right arm.

## 2021-12-21 NOTE — ED Notes (Signed)
Pt left [facility] due to long wait times.  

## 2021-12-21 NOTE — ED Provider Triage Note (Signed)
  Emergency Medicine Provider Triage Evaluation Note  MRN:  [id number]  Arrival date & time: 12/21/21    Medically screening exam initiated at 12:28 AM.   CC:   Rash   HPI:  [name] is a 51 y.o. year-old male presents to the ED with chief complaint of rash on chest and arm.  Also states that his finger itches.  Denies fever or other associated symptoms.  History provided by patient. ROS:  -As included in HPI PE:   Vitals:   12/21/21 0014  BP: (!) 178/102  Pulse: 87  Resp: 18  Temp: 98.6 F (37 C)  SpO2: 93%    Non-toxic appearing No respiratory distress Papular rash on chest MDM:    Patient was informed that the remainder of the evaluation will be completed by another provider, this initial triage assessment does not replace that evaluation, and the importance of remaining in the ED until their evaluation is complete.    Montine Circle, PA-C 12/21/21 0028

## 2021-12-27 ENCOUNTER — Other Ambulatory Visit: Payer: Self-pay

## 2021-12-27 ENCOUNTER — Encounter (HOSPITAL_COMMUNITY): Payer: Self-pay | Admitting: Emergency Medicine

## 2021-12-27 ENCOUNTER — Emergency Department (HOSPITAL_COMMUNITY)
Admission: EM | Admit: 2021-12-27 | Discharge: 2021-12-28 | Discharge status: Against Medical Advice | Payer: Commercial Managed Care - HMO | Attending: Emergency Medicine | Admitting: Emergency Medicine

## 2021-12-27 DIAGNOSIS — Z5321 Procedure and treatment not carried out due to patient leaving prior to being seen by health care provider: Secondary | ICD-10-CM | POA: Diagnosis not present

## 2021-12-27 DIAGNOSIS — R1030 Lower abdominal pain, unspecified: Secondary | ICD-10-CM | POA: Diagnosis present

## 2021-12-27 LAB — CBC
HCT: 36.8 % — ABNORMAL LOW (ref 39.0–52.0)
Hemoglobin: 11.7 g/dL — ABNORMAL LOW (ref 13.0–17.0)
MCH: 29.6 pg (ref 26.0–34.0)
MCHC: 31.8 g/dL (ref 30.0–36.0)
MCV: 93.2 fL (ref 80.0–100.0)
Platelets: 338 10*3/uL (ref 150–400)
RBC: 3.95 MIL/uL — ABNORMAL LOW (ref 4.22–5.81)
RDW: 14.6 % (ref 11.5–15.5)
WBC: 5.7 10*3/uL (ref 4.0–10.5)
nRBC: 0 % (ref 0.0–0.2)

## 2021-12-27 LAB — COMPREHENSIVE METABOLIC PANEL
ALT: 44 U/L (ref 0–44)
AST: 39 U/L (ref 15–41)
Albumin: 2.7 g/dL — ABNORMAL LOW (ref 3.5–5.0)
Alkaline Phosphatase: 341 U/L — ABNORMAL HIGH (ref 38–126)
Anion gap: 13 (ref 5–15)
BUN: 32 mg/dL — ABNORMAL HIGH (ref 6–20)
CO2: 29 mmol/L (ref 22–32)
Calcium: 7.8 mg/dL — ABNORMAL LOW (ref 8.9–10.3)
Chloride: 96 mmol/L — ABNORMAL LOW (ref 98–111)
Creatinine, Ser: 5.84 mg/dL — ABNORMAL HIGH (ref 0.61–1.24)
GFR, Estimated: 11 mL/min — ABNORMAL LOW (ref >60)
Glucose, Bld: 190 mg/dL — ABNORMAL HIGH (ref 70–99)
Potassium: 3.7 mmol/L (ref 3.5–5.1)
Sodium: 138 mmol/L (ref 135–145)
Total Bilirubin: 0.6 mg/dL (ref 0.3–1.2)
Total Protein: 6.8 g/dL (ref 6.5–8.1)

## 2021-12-27 LAB — LIPASE, BLOOD: Lipase: 30 U/L (ref 11–51)

## 2021-12-27 NOTE — ED Triage Notes (Signed)
Patient reports pain across his lower abdomen onset today , no emesis or diarrhea , denies fever or chills , hemodialysis q Tues/Thur/Sat .

## 2021-12-28 NOTE — ED Notes (Signed)
When obtaining vitals on this patient he states "you dont have to do that, I'm leaving." This writer reminded patient that his oxygen was less than 87% w/o oxygen on. He states "well check my levels." Patient maintaining 96% on RA. Patient took off oxygen that this Probation officer previously put on him. Patient states "I'm good I'm going to the bus stop I have somewhere to be at."

## 2021-12-28 NOTE — ED Notes (Signed)
Patient put on 2L O2. Oxygen improved to 94%.

## 2021-12-28 NOTE — ED Notes (Signed)
Oxygen 87%. Triage RN notified.

## 2021-12-29 ENCOUNTER — Emergency Department (HOSPITAL_COMMUNITY)
Admission: EM | Admit: 2021-12-29 | Discharge: 2021-12-30 | Discharge status: Against Medical Advice | Payer: Commercial Managed Care - HMO | Attending: Emergency Medicine | Admitting: Emergency Medicine

## 2021-12-29 ENCOUNTER — Other Ambulatory Visit: Payer: Self-pay

## 2021-12-29 ENCOUNTER — Encounter (HOSPITAL_COMMUNITY): Payer: Self-pay | Admitting: *Deleted

## 2021-12-29 DIAGNOSIS — Z992 Dependence on renal dialysis: Secondary | ICD-10-CM | POA: Insufficient documentation

## 2021-12-29 DIAGNOSIS — R0602 Shortness of breath: Secondary | ICD-10-CM | POA: Diagnosis present

## 2021-12-29 DIAGNOSIS — Z5321 Procedure and treatment not carried out due to patient leaving prior to being seen by health care provider: Secondary | ICD-10-CM | POA: Insufficient documentation

## 2021-12-29 LAB — CBC
HCT: 35.8 % — ABNORMAL LOW (ref 39.0–52.0)
Hemoglobin: 11.2 g/dL — ABNORMAL LOW (ref 13.0–17.0)
MCH: 29.4 pg (ref 26.0–34.0)
MCHC: 31.3 g/dL (ref 30.0–36.0)
MCV: 94 fL (ref 80.0–100.0)
Platelets: 311 10*3/uL (ref 150–400)
RBC: 3.81 MIL/uL — ABNORMAL LOW (ref 4.22–5.81)
RDW: 14.5 % (ref 11.5–15.5)
WBC: 5.9 10*3/uL (ref 4.0–10.5)
nRBC: 0 % (ref 0.0–0.2)

## 2021-12-29 LAB — BASIC METABOLIC PANEL
Anion gap: 12 (ref 5–15)
BUN: 56 mg/dL — ABNORMAL HIGH (ref 6–20)
CO2: 26 mmol/L (ref 22–32)
Calcium: 7.4 mg/dL — ABNORMAL LOW (ref 8.9–10.3)
Chloride: 100 mmol/L (ref 98–111)
Creatinine, Ser: 8.21 mg/dL — ABNORMAL HIGH (ref 0.61–1.24)
GFR, Estimated: 7 mL/min — ABNORMAL LOW (ref >60)
Glucose, Bld: 186 mg/dL — ABNORMAL HIGH (ref 70–99)
Potassium: 4.1 mmol/L (ref 3.5–5.1)
Sodium: 138 mmol/L (ref 135–145)

## 2021-12-29 NOTE — ED Notes (Signed)
Pt refusing xray.

## 2021-12-29 NOTE — ED Provider Triage Note (Signed)
  Emergency Medicine Provider Triage Evaluation Note  MRN:  [id number]  Arrival date & time: 12/29/21    Medically screening exam initiated at 10:07 PM.   CC:   SOB  HPI:  [name] is a 51 y.o. year-old male presents to the ED with chief complaint of SOB.  States he missed dialysis today.  State he was concerned that his oxygen was going to drop because he missed  HD.  Denies new cough or fever.  History provided by patient. ROS:  -As included in HPI PE:   Vitals:   12/29/21 2140  BP: (!) 183/98  Pulse: 92  Resp: 16  Temp: 99 F (37.2 C)  SpO2: 92%    Non-toxic appearing No respiratory distress  MDM:   I've ordered labs and imaging in triage to expedite lab/diagnostic workup.  Patient was informed that the remainder of the evaluation will be completed by another provider, this initial triage assessment does not replace that evaluation, and the importance of remaining in the ED until their evaluation is complete.    Montine Circle, PA-C 12/29/21 2210

## 2021-12-29 NOTE — ED Triage Notes (Signed)
Pt was picked up from the bus stop by [facility], pt says he was worried his oxygen level would drop while at the bus stop. Pt also missed his dialysis today. En route, vitals 180 palpated, hr 92, 98% RA.

## 2021-12-29 NOTE — ED Triage Notes (Signed)
Pt says when its hot outside he has a hard time breathing. Reports he missed his dialysis today (Saturday), last was on Thursday. Pt resting in w/c, no acute distress.

## 2021-12-30 ENCOUNTER — Emergency Department (HOSPITAL_COMMUNITY)
Admission: EM | Admit: 2021-12-30 | Discharge: 2021-12-31 | Discharge status: Against Medical Advice | Payer: Commercial Managed Care - HMO | Source: Home / Self Care

## 2021-12-30 ENCOUNTER — Emergency Department (HOSPITAL_COMMUNITY): Payer: Commercial Managed Care - HMO

## 2021-12-30 ENCOUNTER — Encounter (HOSPITAL_COMMUNITY): Payer: Self-pay

## 2021-12-30 ENCOUNTER — Other Ambulatory Visit: Payer: Self-pay

## 2021-12-30 DIAGNOSIS — Z5321 Procedure and treatment not carried out due to patient leaving prior to being seen by health care provider: Secondary | ICD-10-CM | POA: Insufficient documentation

## 2021-12-30 DIAGNOSIS — R0602 Shortness of breath: Secondary | ICD-10-CM | POA: Insufficient documentation

## 2021-12-30 NOTE — ED Notes (Signed)
Pt stated he did not have enough time to be seen before having to leave when the bus first comes.

## 2021-12-30 NOTE — ED Provider Triage Note (Signed)
Emergency Medicine Provider Triage Evaluation Note  KROSS SWALLOWS , a 51 y.o. male  was evaluated in triage.  Pt complains of SOB.  Missed HD session on Saturday due to transportation issues.  He was seen here late last night/early this morning but left due to wait time.  He reports SOB continues.  Review of Systems  Positive: SOB, missed HD Negative: fever  Physical Exam  BP (!) 176/103   Pulse 87   Temp 98.4 F (36.9 C)   Resp 18   Ht 5\' 7"  (1.702 m)   Wt 73.5 kg   SpO2 95%   BMI 25.38 kg/m   Gen:   Awake, no distress   Resp:  Normal effort  MSK:   Moves extremities without difficulty  Other:  Fistula right forearm, thrill present   CXR earlier this AM: CLINICAL DATA:  Shortness of breath   EXAM: CHEST - 2 VIEW   COMPARISON:  12/10/2021   FINDINGS: Cardiac shadow is enlarged but stable. The lungs are well aerated bilaterally. No focal infiltrate or effusion is seen. No bony abnormality is noted.   IMPRESSION: Stable cardiomegaly.  No new focal abnormality is seen.     Electronically Signed   By: Inez Catalina M.D.   On: 12/30/2021 00:42    Medical Decision Making  Medically screening exam initiated at 11:18 PM.  Appropriate orders placed.  AZAI GAFFIN was informed that the remainder of the evaluation will be completed by another provider, this initial triage assessment does not replace that evaluation, and the importance of remaining in the ED until their evaluation is complete.  SOB, missed HD.  CXR earlier this morning with cardiomegaly but no significant edema.  Sats 95% in triage, no distress noted.  Will recheck labs.   Larene Pickett, PA-C 12/30/21 2330

## 2021-12-30 NOTE — ED Triage Notes (Signed)
Pt is c/o SOB. Did not receive full dialysis Thursday and missed Saturday treatment. Denies cough, chest pain N&V.

## 2021-12-31 ENCOUNTER — Emergency Department (HOSPITAL_COMMUNITY): Payer: Commercial Managed Care - HMO

## 2021-12-31 ENCOUNTER — Emergency Department (HOSPITAL_COMMUNITY)
Admission: EM | Admit: 2021-12-31 | Discharge: 2022-01-01 | Disposition: A | Discharge status: Home / Self Care | Payer: Commercial Managed Care - HMO | Attending: Emergency Medicine | Admitting: Emergency Medicine

## 2021-12-31 DIAGNOSIS — Z992 Dependence on renal dialysis: Secondary | ICD-10-CM | POA: Diagnosis not present

## 2021-12-31 DIAGNOSIS — Z79899 Other long term (current) drug therapy: Secondary | ICD-10-CM | POA: Diagnosis not present

## 2021-12-31 DIAGNOSIS — R0602 Shortness of breath: Secondary | ICD-10-CM | POA: Diagnosis present

## 2021-12-31 DIAGNOSIS — N186 End stage renal disease: Secondary | ICD-10-CM | POA: Insufficient documentation

## 2021-12-31 DIAGNOSIS — Z794 Long term (current) use of insulin: Secondary | ICD-10-CM | POA: Insufficient documentation

## 2021-12-31 LAB — CBC WITH DIFFERENTIAL/PLATELET
Abs Immature Granulocytes: 0.02 10*3/uL (ref 0.00–0.07)
Basophils Absolute: 0 10*3/uL (ref 0.0–0.1)
Basophils Relative: 0 %
Eosinophils Absolute: 0.2 10*3/uL (ref 0.0–0.5)
Eosinophils Relative: 4 %
HCT: 33.4 % — ABNORMAL LOW (ref 39.0–52.0)
Hemoglobin: 10.8 g/dL — ABNORMAL LOW (ref 13.0–17.0)
Immature Granulocytes: 0 %
Lymphocytes Relative: 17 %
Lymphs Abs: 1 10*3/uL (ref 0.7–4.0)
MCH: 29.8 pg (ref 26.0–34.0)
MCHC: 32.3 g/dL (ref 30.0–36.0)
MCV: 92.3 fL (ref 80.0–100.0)
Monocytes Absolute: 0.6 10*3/uL (ref 0.1–1.0)
Monocytes Relative: 11 %
Neutro Abs: 3.8 10*3/uL (ref 1.7–7.7)
Neutrophils Relative %: 68 %
Platelets: 297 10*3/uL (ref 150–400)
RBC: 3.62 MIL/uL — ABNORMAL LOW (ref 4.22–5.81)
RDW: 14.6 % (ref 11.5–15.5)
WBC: 5.7 10*3/uL (ref 4.0–10.5)
nRBC: 0 % (ref 0.0–0.2)

## 2021-12-31 LAB — BASIC METABOLIC PANEL
Anion gap: 15 (ref 5–15)
BUN: 65 mg/dL — ABNORMAL HIGH (ref 6–20)
CO2: 20 mmol/L — ABNORMAL LOW (ref 22–32)
Calcium: 7.3 mg/dL — ABNORMAL LOW (ref 8.9–10.3)
Chloride: 100 mmol/L (ref 98–111)
Creatinine, Ser: 9.12 mg/dL — ABNORMAL HIGH (ref 0.61–1.24)
GFR, Estimated: 7 mL/min — ABNORMAL LOW (ref >60)
Glucose, Bld: 224 mg/dL — ABNORMAL HIGH (ref 70–99)
Potassium: 4.7 mmol/L (ref 3.5–5.1)
Sodium: 135 mmol/L (ref 135–145)

## 2021-12-31 NOTE — ED Notes (Signed)
Pt refused vitals 

## 2021-12-31 NOTE — ED Triage Notes (Signed)
Pt BIB [facility] c/o SOB. Pt missed his Saturday dialysis. Tues, thurs, sat dialysis. Pt in no distress at this time and resting.

## 2021-12-31 NOTE — ED Notes (Signed)
Pt called in lobby, no response 

## 2021-12-31 NOTE — ED Notes (Signed)
X-RAY TECH CAME TO GET PATIENT PATIENT REFUSES TO GO

## 2022-01-01 ENCOUNTER — Emergency Department (HOSPITAL_COMMUNITY): Payer: Commercial Managed Care - HMO

## 2022-01-01 LAB — BASIC METABOLIC PANEL
Anion gap: 14 (ref 5–15)
BUN: 76 mg/dL — ABNORMAL HIGH (ref 6–20)
CO2: 21 mmol/L — ABNORMAL LOW (ref 22–32)
Calcium: 7.3 mg/dL — ABNORMAL LOW (ref 8.9–10.3)
Chloride: 101 mmol/L (ref 98–111)
Creatinine, Ser: 10.25 mg/dL — ABNORMAL HIGH (ref 0.61–1.24)
GFR, Estimated: 6 mL/min — ABNORMAL LOW (ref >60)
Glucose, Bld: 146 mg/dL — ABNORMAL HIGH (ref 70–99)
Potassium: 4.3 mmol/L (ref 3.5–5.1)
Sodium: 136 mmol/L (ref 135–145)

## 2022-01-01 NOTE — ED Notes (Signed)
Attempted to complete an assessment on pt and pt wouldn't wake up to answer any guestions

## 2022-01-01 NOTE — ED Notes (Signed)
Pt is having to be escorted out by security at this time

## 2022-01-01 NOTE — ED Notes (Signed)
Pt is refusing to answer questions for an assessment and is refusing VS. Pt however is A&Ox4, resp equal and non labored. NAD noted at this time

## 2022-01-01 NOTE — ED Provider Notes (Signed)
[place] EMERGENCY DEPARTMENT Provider Note   CSN: [id number] Arrival date & time:        History  Chief Complaint  Patient presents with   Shortness of Breath    [name] is a 51 y.o. male.  Patient presents to the emergency department with concerns over shortness of breath.  Patient has a history of end-stage renal disease, on dialysis.  He missed his last dialysis session.       Home Medications Prior to Admission medications   Medication Sig Start Date End Date Taking? Authorizing Provider  amLODipine (NORVASC) 10 MG tablet Take 1 tablet by mouth once a day 11/20/21     amoxicillin-clavulanate (AUGMENTIN) 500-125 MG tablet Take 1 tablet (500 mg total) by mouth daily. 11/08/21   [name], MD  glucose blood test strip Use to check fasting blood sugar once daily. 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 Patient not taking: Reported on 09/28/2021 02/26/21   [name], [name], MD  insulin glargine (LANTUS) 100 UNIT/ML Solostar Pen Inject 10 Units into the skin daily. May substitute with any other long-acting formulary. Patient not taking: Reported on 09/28/2021 05/13/21   [name], MD  Insulin Pen Needle 32G X 4 MM MISC Use with insulin pens 02/26/21   [name], [name], MD  RENVELA 800 MG tablet Take 800-1,600 mg by mouth See admin instructions. Take 2 tablets (1600 mg) three times a day with meals and 1 tablet (800 mg) twice a day as needed with snacks Patient not taking: Reported on 11/06/2021 10/25/21   [provider]  sevelamer carbonate (RENVELA) 800 MG tablet Take 2 tablet by mouth three times a day with meals and 1 tablet twice daily as needed with snacks 11/20/21     gabapentin (NEURONTIN) 300 MG capsule Take 1 capsule (300 mg total) by mouth 3 (three) times daily. Patient not  taking: Reported on 07/27/2019 07/29/18 08/30/19  [name], FNP  insulin detemir (LEVEMIR FLEXTOUCH) 100 UNIT/ML FlexPen Inject 35 Units into the skin daily. Patient not taking: Reported on 02/17/2021 11/29/20 02/24/21  [name], MD  sildenafil (VIAGRA) 25 MG tablet Take 1 tablet (25 mg total) by mouth as needed for erectile dysfunction. for erectile dysfunction. Patient not taking: Reported on 08/11/2020 10/13/19 11/27/20  [name], FNP  sitaGLIPtin (JANUVIA) 25 MG tablet Take 1 tablet (25 mg total) by mouth daily. Patient not taking: Reported on 08/11/2020 02/22/20 11/27/20  [name], MD      Allergies    Sulfa antibiotics, Keflex [cephalexin], Other, and Bactrim [sulfamethoxazole-trimethoprim]    Review of Systems   Review of Systems  Physical Exam Updated Vital Signs SpO2 93%  Physical Exam Vitals and nursing note reviewed.  Constitutional:      General: He is not in acute distress.    Appearance: He is well-developed.  HENT:     Head: Normocephalic and atraumatic.     Mouth/Throat:     Mouth: Mucous membranes are moist.  Eyes:     General: Vision grossly intact. Gaze aligned appropriately.     Extraocular Movements: Extraocular movements intact.     Conjunctiva/sclera: Conjunctivae normal.  Cardiovascular:     Rate and Rhythm: Normal rate and regular rhythm.     Pulses: Normal pulses.     Heart  sounds: Normal heart sounds, S1 normal and S2 normal. No murmur heard.    No friction rub. No gallop.  Pulmonary:     Effort: Pulmonary effort is normal. No respiratory distress.     Breath sounds: Normal breath sounds.  Abdominal:     Palpations: Abdomen is soft.     Tenderness: There is no abdominal tenderness. There is no guarding or rebound.     Hernia: No hernia is present.  Musculoskeletal:        General: No swelling.     Cervical back: Full passive range of motion without pain, normal range of motion and neck supple. No pain with movement,  spinous process tenderness or muscular tenderness. Normal range of motion.     Right lower leg: No edema.     Left lower leg: No edema.  Skin:    General: Skin is warm and dry.     Capillary Refill: Capillary refill takes less than 2 seconds.     Findings: No ecchymosis, erythema, lesion or wound.  Neurological:     Mental Status: He is alert and oriented to person, place, and time.     GCS: GCS eye subscore is 4. GCS verbal subscore is 5. GCS motor subscore is 6.     Cranial Nerves: Cranial nerves 2-12 are intact.     Sensory: Sensation is intact.     Motor: Motor function is intact. No weakness or abnormal muscle tone.     Coordination: Coordination is intact.  Psychiatric:        Mood and Affect: Mood normal.        Speech: Speech normal.        Behavior: Behavior normal.     ED Results / Procedures / Treatments   Labs (all labs ordered are listed, but only abnormal results are displayed) Labs Reviewed  BASIC METABOLIC PANEL - Abnormal; Notable for the following components:      Result Value   CO2 21 (*)    Glucose, Bld 146 (*)    BUN 76 (*)    Creatinine, Ser 10.25 (*)    Calcium 7.3 (*)    GFR, Estimated 6 (*)    All other components within normal limits    EKG EKG Interpretation  Date/Time:  Monday December 31 2021 23:56:38 EDT Ventricular Rate:  83 PR Interval:  140 QRS Duration: 152 QT Interval:  456 QTC Calculation: 535 R Axis:   -73 Text Interpretation: Normal sinus rhythm Left axis deviation Left bundle branch block Abnormal ECG When compared with ECG of 30-Dec-2021 23:25, No significant change was found Confirmed by Orpah Greek [phone]) on 01/01/2022 12:18:47 AM  Radiology DG Chest 1 View  Result Date: 01/01/2022 CLINICAL DATA:  Dyspnea EXAM: CHEST  1 VIEW COMPARISON:  Radiographs 12/30/2021 FINDINGS: Enlarged cardiac silhouette, unchanged from 12/30/2021 given hypoventilation. Airspace opacity in the right lower lung. No pleural effusion or  pneumothorax. No acute osseous abnormality. IMPRESSION: Airspace opacity in the right lower lung is favored to represent atelectasis or pneumonia in the appropriate clinical setting. Stable cardiomegaly. Electronically Signed   By: Placido Sou M.D.   On: 01/01/2022 00:49    Procedures Procedures    Medications Ordered in ED Medications - No data to display  ED Course/ Medical Decision Making/ A&P                           Medical Decision Making Amount and/or Complexity of  Data Reviewed Labs: ordered. Radiology: ordered.   Plan patient mated back to the exam room, he is in no distress.  Patient was actually asleep when I initially examined him.  He easily awakens.  Lungs are clear, no respiratory distress.  No hypoxia.  Electrolytes are unremarkable.  Chest x-ray does not show pulmonary edema.  Does not require emergent dialysis.        Final Clinical Impression(s) / ED Diagnoses Final diagnoses:  ESRD (end stage renal disease) on dialysis [facility])    Rx / DC Orders ED Discharge Orders     None         Jahid Weida, Gwenyth Allegra, MD 01/01/22 0157

## 2022-01-01 NOTE — Discharge Instructions (Signed)
You do not need emergent dialysis.  We cannot perform your routine dialysis here.  Get your regularly scheduled dialysis later today.

## 2022-01-06 ENCOUNTER — Other Ambulatory Visit: Payer: Self-pay

## 2022-01-06 ENCOUNTER — Encounter (HOSPITAL_COMMUNITY): Payer: Self-pay

## 2022-01-06 ENCOUNTER — Emergency Department (HOSPITAL_COMMUNITY)
Admission: EM | Admit: 2022-01-06 | Discharge: 2022-01-07 | Disposition: A | Discharge status: Home / Self Care | Payer: Commercial Managed Care - HMO | Attending: Emergency Medicine | Admitting: Emergency Medicine

## 2022-01-06 ENCOUNTER — Ambulatory Visit (HOSPITAL_COMMUNITY)
Admission: EM | Admit: 2022-01-06 | Discharge: 2022-01-06 | Disposition: A | Discharge status: Home / Self Care | Payer: Commercial Managed Care - HMO | Attending: Physician Assistant | Admitting: Physician Assistant

## 2022-01-06 ENCOUNTER — Emergency Department (HOSPITAL_COMMUNITY): Payer: Commercial Managed Care - HMO

## 2022-01-06 DIAGNOSIS — R0609 Other forms of dyspnea: Secondary | ICD-10-CM

## 2022-01-06 DIAGNOSIS — I10 Essential (primary) hypertension: Secondary | ICD-10-CM

## 2022-01-06 DIAGNOSIS — R21 Rash and other nonspecific skin eruption: Secondary | ICD-10-CM | POA: Diagnosis not present

## 2022-01-06 DIAGNOSIS — Z992 Dependence on renal dialysis: Secondary | ICD-10-CM | POA: Insufficient documentation

## 2022-01-06 DIAGNOSIS — L299 Pruritus, unspecified: Secondary | ICD-10-CM | POA: Diagnosis not present

## 2022-01-06 DIAGNOSIS — R0602 Shortness of breath: Secondary | ICD-10-CM | POA: Insufficient documentation

## 2022-01-06 DIAGNOSIS — Z5321 Procedure and treatment not carried out due to patient leaving prior to being seen by health care provider: Secondary | ICD-10-CM | POA: Diagnosis not present

## 2022-01-06 DIAGNOSIS — N186 End stage renal disease: Secondary | ICD-10-CM | POA: Insufficient documentation

## 2022-01-06 LAB — CBC WITH DIFFERENTIAL/PLATELET
Abs Immature Granulocytes: 0.03 10*3/uL (ref 0.00–0.07)
Basophils Absolute: 0 10*3/uL (ref 0.0–0.1)
Basophils Relative: 0 %
Eosinophils Absolute: 0.2 10*3/uL (ref 0.0–0.5)
Eosinophils Relative: 3 %
HCT: 32.3 % — ABNORMAL LOW (ref 39.0–52.0)
Hemoglobin: 10.4 g/dL — ABNORMAL LOW (ref 13.0–17.0)
Immature Granulocytes: 1 %
Lymphocytes Relative: 13 %
Lymphs Abs: 0.9 10*3/uL (ref 0.7–4.0)
MCH: 29.7 pg (ref 26.0–34.0)
MCHC: 32.2 g/dL (ref 30.0–36.0)
MCV: 92.3 fL (ref 80.0–100.0)
Monocytes Absolute: 0.8 10*3/uL (ref 0.1–1.0)
Monocytes Relative: 13 %
Neutro Abs: 4.6 10*3/uL (ref 1.7–7.7)
Neutrophils Relative %: 70 %
Platelets: 258 10*3/uL (ref 150–400)
RBC: 3.5 MIL/uL — ABNORMAL LOW (ref 4.22–5.81)
RDW: 14.6 % (ref 11.5–15.5)
WBC: 6.6 10*3/uL (ref 4.0–10.5)
nRBC: 0 % (ref 0.0–0.2)

## 2022-01-06 MED ORDER — DOXYCYCLINE HYCLATE 100 MG PO TABS
100.0000 mg | ORAL_TABLET | Freq: Two times a day (BID) | ORAL | 1 refills | Status: DC
  Filled 2022-01-06: qty 30, 15d supply, fill #0

## 2022-01-06 MED ORDER — TRIAMCINOLONE ACETONIDE 0.1 % EX CREA
1.0000 | TOPICAL_CREAM | Freq: Two times a day (BID) | CUTANEOUS | 0 refills | Status: DC
  Filled 2022-01-06: qty 80, 30d supply, fill #0

## 2022-01-06 NOTE — Discharge Instructions (Addendum)
Advised to take the doxycycline 100 mg twice daily until completed to see if this helps with the rash on the chest wall. Advised to take the pressure medicine on a regular basis when you pick it up to help control your hypertension. Advised to use the triamcinolone cream and apply to the rash area to help reduce some of the itching. An internal referral has been made to dermatology-[facility] should be calling you in the next 24 to 48 hours to set up an appointment for evaluation of the skin rash. Advised to follow-up PCP or return to urgent care if symptoms fail to improve.
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
## 2022-01-06 NOTE — ED Triage Notes (Signed)
The patient c/o bumps to his chest that have been there "for a long time". The patient states he does have SOB. Patient does not actively display signs/ symptoms of respiratory distress, and the pt denies chest pain.   Home interventions: none

## 2022-01-06 NOTE — ED Provider Triage Note (Signed)
Emergency Medicine Provider Triage Evaluation Note  AXLE PARFAIT , a 51 y.o. male  was evaluated in triage.  Pt complains of SOB. States this happens when it is hot outside and when he doesn't get to dialysis. Scheduled for dialysis on T/Th/Sa. 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  Review of Systems  Positive: As above Negative: As above  Physical Exam  BP (!) 168/101 (BP Location: Left Arm)   Pulse 83   Temp 98.8 F (37.1 C) (Oral)   Resp 18   SpO2 96%  Gen:   Awake, no distress   Resp:  Normal effort  MSK:   Moves extremities without difficulty  Other:  Lungs CTAB.  Medical Decision Making  Medically screening exam initiated at 11:15 PM.  Appropriate orders placed.  KASCH BORQUEZ was informed that the remainder of the evaluation will be completed by another provider, this initial triage assessment does not replace that evaluation, and the importance of remaining in the ED until their evaluation is complete.  SOB - does not appear fluid overloaded. Screening labs ordered as well as CXR, EKG given history of ESRD.  Has been seen in the ED 27 times in the past 6 months.   Antonietta Breach, PA-C 01/06/22 2317

## 2022-01-06 NOTE — ED Triage Notes (Signed)
Pt here via [facility] for abd distension, shob, bilateral leg swelling after missing dialysis yesterday, states this started last night but got worse today. Pt gets dialysis T/Th/Sat, 168/104, 88HR, 14RR, 95% RA, cbg 178

## 2022-01-07 ENCOUNTER — Other Ambulatory Visit: Payer: Self-pay

## 2022-01-07 LAB — BASIC METABOLIC PANEL
Anion gap: 14 (ref 5–15)
BUN: 68 mg/dL — ABNORMAL HIGH (ref 6–20)
CO2: 20 mmol/L — ABNORMAL LOW (ref 22–32)
Calcium: 7.3 mg/dL — ABNORMAL LOW (ref 8.9–10.3)
Chloride: 102 mmol/L (ref 98–111)
Creatinine, Ser: 9.52 mg/dL — ABNORMAL HIGH (ref 0.61–1.24)
GFR, Estimated: 6 mL/min — ABNORMAL LOW (ref >60)
Glucose, Bld: 163 mg/dL — ABNORMAL HIGH (ref 70–99)
Potassium: 4.6 mmol/L (ref 3.5–5.1)
Sodium: 136 mmol/L (ref 135–145)

## 2022-01-07 NOTE — ED Notes (Signed)
Pt did not want to wait any longer pt left hospital.

## 2022-01-07 NOTE — ED Notes (Signed)
Pt not responding for vitals  

## 2022-01-08 ENCOUNTER — Other Ambulatory Visit: Payer: Self-pay

## 2022-01-08 ENCOUNTER — Emergency Department (HOSPITAL_COMMUNITY)
Admission: EM | Admit: 2022-01-08 | Discharge: 2022-01-08 | Discharge status: Against Medical Advice | Payer: Commercial Managed Care - HMO | Attending: Emergency Medicine | Admitting: Emergency Medicine

## 2022-01-08 ENCOUNTER — Encounter (HOSPITAL_COMMUNITY): Payer: Self-pay | Admitting: Emergency Medicine

## 2022-01-08 DIAGNOSIS — R0602 Shortness of breath: Secondary | ICD-10-CM | POA: Insufficient documentation

## 2022-01-08 DIAGNOSIS — Z5321 Procedure and treatment not carried out due to patient leaving prior to being seen by health care provider: Secondary | ICD-10-CM | POA: Diagnosis not present

## 2022-01-08 DIAGNOSIS — Z992 Dependence on renal dialysis: Secondary | ICD-10-CM | POA: Insufficient documentation

## 2022-01-08 LAB — CBC WITH DIFFERENTIAL/PLATELET
Abs Immature Granulocytes: 0.02 10*3/uL (ref 0.00–0.07)
Basophils Absolute: 0 10*3/uL (ref 0.0–0.1)
Basophils Relative: 1 %
Eosinophils Absolute: 0.3 10*3/uL (ref 0.0–0.5)
Eosinophils Relative: 5 %
HCT: 35.6 % — ABNORMAL LOW (ref 39.0–52.0)
Hemoglobin: 11.3 g/dL — ABNORMAL LOW (ref 13.0–17.0)
Immature Granulocytes: 0 %
Lymphocytes Relative: 19 %
Lymphs Abs: 1 10*3/uL (ref 0.7–4.0)
MCH: 29.4 pg (ref 26.0–34.0)
MCHC: 31.7 g/dL (ref 30.0–36.0)
MCV: 92.5 fL (ref 80.0–100.0)
Monocytes Absolute: 0.7 10*3/uL (ref 0.1–1.0)
Monocytes Relative: 12 %
Neutro Abs: 3.6 10*3/uL (ref 1.7–7.7)
Neutrophils Relative %: 63 %
Platelets: 287 10*3/uL (ref 150–400)
RBC: 3.85 MIL/uL — ABNORMAL LOW (ref 4.22–5.81)
RDW: 14.6 % (ref 11.5–15.5)
WBC: 5.6 10*3/uL (ref 4.0–10.5)
nRBC: 0 % (ref 0.0–0.2)

## 2022-01-08 LAB — BASIC METABOLIC PANEL
Anion gap: 13 (ref 5–15)
BUN: 81 mg/dL — ABNORMAL HIGH (ref 6–20)
CO2: 18 mmol/L — ABNORMAL LOW (ref 22–32)
Calcium: 7.2 mg/dL — ABNORMAL LOW (ref 8.9–10.3)
Chloride: 102 mmol/L (ref 98–111)
Creatinine, Ser: 10.66 mg/dL — ABNORMAL HIGH (ref 0.61–1.24)
GFR, Estimated: 5 mL/min — ABNORMAL LOW (ref >60)
Glucose, Bld: 175 mg/dL — ABNORMAL HIGH (ref 70–99)
Potassium: 4.6 mmol/L (ref 3.5–5.1)
Sodium: 133 mmol/L — ABNORMAL LOW (ref 135–145)

## 2022-01-08 NOTE — ED Provider Triage Note (Signed)
Emergency Medicine Provider Triage Evaluation Note  JAXSON ANGLIN , a 51 y.o. male  was evaluated in triage.  Pt complains of SOB.  Had HD on Thursday (01/03/22) but treatment was cut about 30 mins short.  Denies chest pain, just feels SOB.  Review of Systems  Positive: SOB Negative: fever  Physical Exam  BP (!) 166/99 (BP Location: Left Arm)   Pulse 80   Temp 98.4 F (36.9 C) (Oral)   Resp 15   SpO2 97%   Gen:   Awake, no distress   Resp:  Normal effort  MSK:   Moves extremities without difficulty  Other:  Fistula right forearm, thrill present  Medical Decision Making  Medically screening exam initiated at 2:20 AM.  Appropriate orders placed.  ERROLL WILBOURNE was informed that the remainder of the evaluation will be completed by another provider, this initial triage assessment does not replace that evaluation, and the importance of remaining in the ED until their evaluation is complete.  SOB.  ESRD on HD.  Last treatment 01/03/22, cut 30 mins short.  VSS in triage.  EKG, labs, CXR.   Larene Pickett, PA-C 01/08/22 [phone]

## 2022-01-08 NOTE — ED Notes (Signed)
Patient refused chest Xray.  

## 2022-01-08 NOTE — ED Triage Notes (Signed)
Patient arrived with [place] [facility] ( homeless) reports SOB today , no chest pain , hemodialysis q Tues/Thurs/Sat .

## 2022-01-08 NOTE — ED Notes (Signed)
Pt called 3x no answer  

## 2022-01-08 NOTE — ED Notes (Addendum)
Refused vitals 

## 2022-01-13 ENCOUNTER — Encounter (HOSPITAL_COMMUNITY): Payer: Self-pay | Admitting: *Deleted

## 2022-01-13 ENCOUNTER — Emergency Department (HOSPITAL_COMMUNITY): Payer: Commercial Managed Care - HMO

## 2022-01-13 ENCOUNTER — Ambulatory Visit (HOSPITAL_COMMUNITY): Admission: RE | Admit: 2022-01-13 | Payer: Commercial Managed Care - HMO | Source: Ambulatory Visit

## 2022-01-13 ENCOUNTER — Other Ambulatory Visit: Payer: Self-pay

## 2022-01-13 ENCOUNTER — Emergency Department (HOSPITAL_COMMUNITY)
Admission: EM | Admit: 2022-01-13 | Discharge: 2022-01-13 | Discharge status: Against Medical Advice | Payer: Commercial Managed Care - HMO | Source: Home / Self Care

## 2022-01-13 ENCOUNTER — Emergency Department (HOSPITAL_COMMUNITY)
Admission: EM | Admit: 2022-01-13 | Discharge: 2022-01-13 | Discharge status: Against Medical Advice | Payer: Commercial Managed Care - HMO | Attending: Emergency Medicine | Admitting: Emergency Medicine

## 2022-01-13 DIAGNOSIS — Z5321 Procedure and treatment not carried out due to patient leaving prior to being seen by health care provider: Secondary | ICD-10-CM | POA: Insufficient documentation

## 2022-01-13 DIAGNOSIS — R109 Unspecified abdominal pain: Secondary | ICD-10-CM | POA: Insufficient documentation

## 2022-01-13 DIAGNOSIS — M7989 Other specified soft tissue disorders: Secondary | ICD-10-CM | POA: Insufficient documentation

## 2022-01-13 DIAGNOSIS — R0602 Shortness of breath: Secondary | ICD-10-CM | POA: Insufficient documentation

## 2022-01-13 DIAGNOSIS — R110 Nausea: Secondary | ICD-10-CM | POA: Insufficient documentation

## 2022-01-13 DIAGNOSIS — Z992 Dependence on renal dialysis: Secondary | ICD-10-CM | POA: Insufficient documentation

## 2022-01-13 DIAGNOSIS — Z20822 Contact with and (suspected) exposure to covid-19: Secondary | ICD-10-CM | POA: Insufficient documentation

## 2022-01-13 LAB — CBC WITH DIFFERENTIAL/PLATELET
Abs Immature Granulocytes: 0.02 10*3/uL (ref 0.00–0.07)
Basophils Absolute: 0 10*3/uL (ref 0.0–0.1)
Basophils Relative: 0 %
Eosinophils Absolute: 0.3 10*3/uL (ref 0.0–0.5)
Eosinophils Relative: 4 %
HCT: 33.9 % — ABNORMAL LOW (ref 39.0–52.0)
Hemoglobin: 10.5 g/dL — ABNORMAL LOW (ref 13.0–17.0)
Immature Granulocytes: 0 %
Lymphocytes Relative: 17 %
Lymphs Abs: 1.1 10*3/uL (ref 0.7–4.0)
MCH: 29.2 pg (ref 26.0–34.0)
MCHC: 31 g/dL (ref 30.0–36.0)
MCV: 94.4 fL (ref 80.0–100.0)
Monocytes Absolute: 0.9 10*3/uL (ref 0.1–1.0)
Monocytes Relative: 14 %
Neutro Abs: 4.1 10*3/uL (ref 1.7–7.7)
Neutrophils Relative %: 65 %
Platelets: 290 10*3/uL (ref 150–400)
RBC: 3.59 MIL/uL — ABNORMAL LOW (ref 4.22–5.81)
RDW: 14.6 % (ref 11.5–15.5)
WBC: 6.3 10*3/uL (ref 4.0–10.5)
nRBC: 0 % (ref 0.0–0.2)

## 2022-01-13 LAB — COMPREHENSIVE METABOLIC PANEL
ALT: 55 U/L — ABNORMAL HIGH (ref 0–44)
ALT: 50 U/L — ABNORMAL HIGH (ref 0–44)
AST: 55 U/L — ABNORMAL HIGH (ref 15–41)
AST: 44 U/L — ABNORMAL HIGH (ref 15–41)
Albumin: 2.6 g/dL — ABNORMAL LOW (ref 3.5–5.0)
Albumin: 2.7 g/dL — ABNORMAL LOW (ref 3.5–5.0)
Alkaline Phosphatase: 362 U/L — ABNORMAL HIGH (ref 38–126)
Alkaline Phosphatase: 333 U/L — ABNORMAL HIGH (ref 38–126)
Anion gap: 15 (ref 5–15)
Anion gap: 15 (ref 5–15)
BUN: 56 mg/dL — ABNORMAL HIGH (ref 6–20)
BUN: 65 mg/dL — ABNORMAL HIGH (ref 6–20)
CO2: 21 mmol/L — ABNORMAL LOW (ref 22–32)
CO2: 19 mmol/L — ABNORMAL LOW (ref 22–32)
Calcium: 7.2 mg/dL — ABNORMAL LOW (ref 8.9–10.3)
Calcium: 7.3 mg/dL — ABNORMAL LOW (ref 8.9–10.3)
Chloride: 102 mmol/L (ref 98–111)
Chloride: 103 mmol/L (ref 98–111)
Creatinine, Ser: 8.71 mg/dL — ABNORMAL HIGH (ref 0.61–1.24)
Creatinine, Ser: 9.12 mg/dL — ABNORMAL HIGH (ref 0.61–1.24)
GFR, Estimated: 7 mL/min — ABNORMAL LOW (ref >60)
GFR, Estimated: 7 mL/min — ABNORMAL LOW (ref >60)
Glucose, Bld: 205 mg/dL — ABNORMAL HIGH (ref 70–99)
Glucose, Bld: 262 mg/dL — ABNORMAL HIGH (ref 70–99)
Potassium: 4.3 mmol/L (ref 3.5–5.1)
Potassium: 5 mmol/L (ref 3.5–5.1)
Sodium: 138 mmol/L (ref 135–145)
Sodium: 137 mmol/L (ref 135–145)
Total Bilirubin: 0.5 mg/dL (ref 0.3–1.2)
Total Bilirubin: 0.8 mg/dL (ref 0.3–1.2)
Total Protein: 6.3 g/dL — ABNORMAL LOW (ref 6.5–8.1)
Total Protein: 6.6 g/dL (ref 6.5–8.1)

## 2022-01-13 LAB — RESP PANEL BY RT-PCR (FLU A&B, COVID) ARPGX2
Influenza A by PCR: NEGATIVE
Influenza B by PCR: NEGATIVE
SARS Coronavirus 2 by RT PCR: NEGATIVE

## 2022-01-13 LAB — LIPASE, BLOOD: Lipase: 32 U/L (ref 11–51)

## 2022-01-13 LAB — MAGNESIUM: Magnesium: 2.3 mg/dL (ref 1.7–2.4)

## 2022-01-13 NOTE — ED Provider Triage Note (Signed)
Emergency Medicine Provider Triage Evaluation Note  [name] , a 51 y.o. male  was evaluated in triage.  Pt complains of SOB started today endorses slight cough productive, no fevers or chills, unfortunately missed dialysis today, received his last dialysis on Thursday, worsening leg swelling denies chest pain abdominal pain nausea or vomiting.  EMS noted that he was hypoxic at 86 placed on 4 L now to 100%, while in triage wean down to 2 L still at 100%..  Review of Systems  Positive: Swelling, shortness of breath Negative: Stomach pain pain nausea vomiting  Physical Exam  BP (!) 176/102 (BP Location: Left Arm)   Pulse 85   Temp 99 F (37.2 C)   Resp 17   SpO2 100%  Gen:   Awake, no distress   Resp:  Normal effort Rales heard bilaterally, on 2 L via nasal cannula MSK:   Moves extremities without difficulty  Other:    Medical Decision Making  Medically screening exam initiated at 1:02 AM.  Appropriate orders placed.  ALEJANDRO GAMEL was informed that the remainder of the evaluation will be completed by another provider, this initial triage assessment does not replace that evaluation, and the importance of remaining in the ED until their evaluation is complete.  Lab work imaging been ordered will need further work-up.   Marcello Fennel, PA-C 01/13/22 [phone]

## 2022-01-13 NOTE — ED Provider Triage Note (Signed)
Emergency Medicine Provider Triage Evaluation Note  NICKALAUS CROOKE , a 51 y.o. male  was evaluated in triage.  Pt complains of missing dialysis.  States that he is a Tuesday Thursday Saturday dialysis patient and missed dialysis yesterday because he was being baptized.  States that he had a full treatment on Thursday.  He is endorsing abdominal fullness and nausea without vomiting.  Also endorsing some shortness of breath that started earlier today.  He did present here this morning at 1 AM but left without being seen..  Review of Systems  Positive: See above Negative:   Physical Exam  BP (!) 177/97 (BP Location: Left Arm)   Pulse 85   Temp 98.4 F (36.9 C) (Oral)   Resp 17   SpO2 96%  Gen:   Awake, no distress   Resp:  Normal effort  MSK:   Moves extremities without difficulty  Other:    Medical Decision Making  Medically screening exam initiated at 5:01 PM.  Appropriate orders placed.  EMANI MORAD was informed that the remainder of the evaluation will be completed by another provider, this initial triage assessment does not replace that evaluation, and the importance of remaining in the ED until their evaluation is complete.     Mickie Hillier, PA-C 01/13/22 1702

## 2022-01-13 NOTE — ED Notes (Signed)
Called pt for vitals with no answer. Pt not seen in lobby or outside waiting area

## 2022-01-13 NOTE — ED Notes (Signed)
Patient states he will just go to dialysis in the morning. Seen leaving and a ride picking patient up

## 2022-01-13 NOTE — ED Triage Notes (Addendum)
Pt here from home after missing dialysis yesterday. Pt goes T/Th/Sat, states he missed yesterday because he was getting baptized today and needed to "handle some business" for that. Pt reports abd pain/distension. Pt seen for the same earlier today and left without being seen.

## 2022-01-13 NOTE — ED Triage Notes (Signed)
Pt arrives via [facility] from [facility]. Per report by [facility] pt has SOB all day. Missed dialysis today. 86% RA, placed on 6 liters, up to 100%. Diminished in the lower lobes. Hr 87, rr 20, 172/108.

## 2022-01-14 ENCOUNTER — Other Ambulatory Visit: Payer: Self-pay

## 2022-01-15 ENCOUNTER — Other Ambulatory Visit: Payer: Self-pay

## 2022-01-15 ENCOUNTER — Emergency Department (HOSPITAL_COMMUNITY): Payer: Commercial Managed Care - HMO

## 2022-01-15 ENCOUNTER — Emergency Department (HOSPITAL_COMMUNITY)
Admission: EM | Admit: 2022-01-15 | Discharge: 2022-01-15 | Disposition: A | Discharge status: Home / Self Care | Payer: Commercial Managed Care - HMO | Attending: Emergency Medicine | Admitting: Emergency Medicine

## 2022-01-15 ENCOUNTER — Encounter (HOSPITAL_COMMUNITY): Payer: Self-pay | Admitting: Emergency Medicine

## 2022-01-15 DIAGNOSIS — M7989 Other specified soft tissue disorders: Secondary | ICD-10-CM | POA: Insufficient documentation

## 2022-01-15 DIAGNOSIS — Z5321 Procedure and treatment not carried out due to patient leaving prior to being seen by health care provider: Secondary | ICD-10-CM | POA: Insufficient documentation

## 2022-01-15 LAB — BASIC METABOLIC PANEL
Anion gap: 16 — ABNORMAL HIGH (ref 5–15)
BUN: 76 mg/dL — ABNORMAL HIGH (ref 6–20)
CO2: 20 mmol/L — ABNORMAL LOW (ref 22–32)
Calcium: 7.6 mg/dL — ABNORMAL LOW (ref 8.9–10.3)
Chloride: 104 mmol/L (ref 98–111)
Creatinine, Ser: 10.19 mg/dL — ABNORMAL HIGH (ref 0.61–1.24)
GFR, Estimated: 6 mL/min — ABNORMAL LOW (ref >60)
Glucose, Bld: 145 mg/dL — ABNORMAL HIGH (ref 70–99)
Potassium: 4.6 mmol/L (ref 3.5–5.1)
Sodium: 140 mmol/L (ref 135–145)

## 2022-01-15 LAB — CBC
HCT: 36 % — ABNORMAL LOW (ref 39.0–52.0)
Hemoglobin: 11.1 g/dL — ABNORMAL LOW (ref 13.0–17.0)
MCH: 28.8 pg (ref 26.0–34.0)
MCHC: 30.8 g/dL (ref 30.0–36.0)
MCV: 93.5 fL (ref 80.0–100.0)
Platelets: 305 10*3/uL (ref 150–400)
RBC: 3.85 MIL/uL — ABNORMAL LOW (ref 4.22–5.81)
RDW: 14.5 % (ref 11.5–15.5)
WBC: 5.9 10*3/uL (ref 4.0–10.5)
nRBC: 0 % (ref 0.0–0.2)

## 2022-01-15 LAB — TROPONIN I (HIGH SENSITIVITY): Troponin I (High Sensitivity): 152 ng/L (ref <18)

## 2022-01-15 NOTE — ED Triage Notes (Signed)
Arrives via EMS for swelling in legs and feet. Last HD thurs, miss sat. SBP 210 with EMS. No other complaints.

## 2022-01-15 NOTE — ED Notes (Signed)
Pt called 3x no answer  

## 2022-01-18 ENCOUNTER — Encounter (HOSPITAL_COMMUNITY): Payer: Self-pay | Admitting: Emergency Medicine

## 2022-01-18 ENCOUNTER — Emergency Department (HOSPITAL_COMMUNITY): Payer: Commercial Managed Care - HMO

## 2022-01-18 ENCOUNTER — Other Ambulatory Visit: Payer: Self-pay

## 2022-01-18 ENCOUNTER — Emergency Department (HOSPITAL_COMMUNITY)
Admission: EM | Admit: 2022-01-18 | Discharge: 2022-01-19 | Disposition: A | Discharge status: Home / Self Care | Payer: Commercial Managed Care - HMO | Attending: Emergency Medicine | Admitting: Emergency Medicine

## 2022-01-18 DIAGNOSIS — Z992 Dependence on renal dialysis: Secondary | ICD-10-CM | POA: Diagnosis not present

## 2022-01-18 DIAGNOSIS — I120 Hypertensive chronic kidney disease with stage 5 chronic kidney disease or end stage renal disease: Secondary | ICD-10-CM | POA: Diagnosis not present

## 2022-01-18 DIAGNOSIS — E1122 Type 2 diabetes mellitus with diabetic chronic kidney disease: Secondary | ICD-10-CM | POA: Insufficient documentation

## 2022-01-18 DIAGNOSIS — Z794 Long term (current) use of insulin: Secondary | ICD-10-CM | POA: Insufficient documentation

## 2022-01-18 DIAGNOSIS — Z79899 Other long term (current) drug therapy: Secondary | ICD-10-CM | POA: Diagnosis not present

## 2022-01-18 DIAGNOSIS — N186 End stage renal disease: Secondary | ICD-10-CM | POA: Diagnosis not present

## 2022-01-18 DIAGNOSIS — R0602 Shortness of breath: Secondary | ICD-10-CM | POA: Diagnosis present

## 2022-01-18 DIAGNOSIS — J181 Lobar pneumonia, unspecified organism: Secondary | ICD-10-CM | POA: Diagnosis not present

## 2022-01-18 DIAGNOSIS — J189 Pneumonia, unspecified organism: Secondary | ICD-10-CM

## 2022-01-18 LAB — COMPREHENSIVE METABOLIC PANEL
ALT: 52 U/L — ABNORMAL HIGH (ref 0–44)
AST: 40 U/L (ref 15–41)
Albumin: 3.1 g/dL — ABNORMAL LOW (ref 3.5–5.0)
Alkaline Phosphatase: 361 U/L — ABNORMAL HIGH (ref 38–126)
Anion gap: 20 — ABNORMAL HIGH (ref 5–15)
BUN: 76 mg/dL — ABNORMAL HIGH (ref 6–20)
CO2: 21 mmol/L — ABNORMAL LOW (ref 22–32)
Calcium: 7.9 mg/dL — ABNORMAL LOW (ref 8.9–10.3)
Chloride: 102 mmol/L (ref 98–111)
Creatinine, Ser: 10.5 mg/dL — ABNORMAL HIGH (ref 0.61–1.24)
GFR, Estimated: 6 mL/min — ABNORMAL LOW (ref >60)
Glucose, Bld: 156 mg/dL — ABNORMAL HIGH (ref 70–99)
Potassium: 4.5 mmol/L (ref 3.5–5.1)
Sodium: 143 mmol/L (ref 135–145)
Total Bilirubin: 0.6 mg/dL (ref 0.3–1.2)
Total Protein: 7.5 g/dL (ref 6.5–8.1)

## 2022-01-18 LAB — CBC WITH DIFFERENTIAL/PLATELET
Abs Immature Granulocytes: 0.03 10*3/uL (ref 0.00–0.07)
Basophils Absolute: 0 10*3/uL (ref 0.0–0.1)
Basophils Relative: 0 %
Eosinophils Absolute: 0.3 10*3/uL (ref 0.0–0.5)
Eosinophils Relative: 3 %
HCT: 39.1 % (ref 39.0–52.0)
Hemoglobin: 12.3 g/dL — ABNORMAL LOW (ref 13.0–17.0)
Immature Granulocytes: 0 %
Lymphocytes Relative: 14 %
Lymphs Abs: 1.2 10*3/uL (ref 0.7–4.0)
MCH: 28.9 pg (ref 26.0–34.0)
MCHC: 31.5 g/dL (ref 30.0–36.0)
MCV: 91.8 fL (ref 80.0–100.0)
Monocytes Absolute: 0.8 10*3/uL (ref 0.1–1.0)
Monocytes Relative: 9 %
Neutro Abs: 6.5 10*3/uL (ref 1.7–7.7)
Neutrophils Relative %: 74 %
Platelets: 310 10*3/uL (ref 150–400)
RBC: 4.26 MIL/uL (ref 4.22–5.81)
RDW: 14.6 % (ref 11.5–15.5)
WBC: 8.8 10*3/uL (ref 4.0–10.5)
nRBC: 0 % (ref 0.0–0.2)

## 2022-01-18 MED ORDER — ONDANSETRON 4 MG PO TBDP
4.0000 mg | ORAL_TABLET | Freq: Once | ORAL | Status: AC
  Administered 2022-01-18: 4 mg via ORAL
  Filled 2022-01-18: qty 1

## 2022-01-18 NOTE — ED Triage Notes (Signed)
Patient missed his  hemodialysis treatment yesterday with emesis and elevated blood pressure BP= 200/116 by EMS .

## 2022-01-18 NOTE — ED Provider Notes (Signed)
[facility] EMERGENCY DEPARTMENT Provider Note   CSN: [id number] Arrival date & time: 01/18/22  2052     History  Chief Complaint  Patient presents with   MIssed Hemodialysis    [name] is a 51 y.o. male.  The history is provided by the patient and medical records.   51 y.o. M with hx of ESRD on HD, DM2, HLP, LBBB, HTN, presenting to the ED with SOB.  He is well known to the ED for same.  He initially stated he had treatment yesterday but got off the machine 45 mins early, then changed story to not receiving any treatment yesterday.  His usual schedule is [name], Sat.   He reports emesis x1 earlier today but has been able to eat/drink since that time.  Home Medications Prior to Admission medications   Medication Sig Start Date End Date Taking? Authorizing Provider  amLODipine (NORVASC) 10 MG tablet Take 1 tablet by mouth once a day 11/20/21     amoxicillin-clavulanate (AUGMENTIN) 500-125 MG tablet Take 1 tablet (500 mg total) by mouth daily. 11/08/21   [name], MD  doxycycline (VIBRA-TABS) 100 MG tablet Take 1 tablet (100 mg total) by mouth 2 (two) times daily. 01/06/22   Nyoka Lint, PA-C  glucose blood test strip Use to check fasting blood sugar once daily. 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 Patient not taking: Reported on 09/28/2021 02/26/21   [name], [name], MD  insulin glargine (LANTUS) 100 UNIT/ML Solostar Pen Inject 10 Units into the skin daily. May substitute with any other long-acting formulary. Patient not taking: Reported on 09/28/2021 05/13/21   [name], MD  Insulin Pen Needle 32G X 4 MM MISC Use with insulin pens 02/26/21   [name], [name], MD  RENVELA 800 MG tablet Take 800-1,600 mg by mouth See admin instructions. Take 2 tablets (1600 mg) three times a day with meals and 1  tablet (800 mg) twice a day as needed with snacks Patient not taking: Reported on 11/06/2021 10/25/21   [provider]  sevelamer carbonate (RENVELA) 800 MG tablet Take 2 tablet by mouth three times a day with meals and 1 tablet twice daily as needed with snacks 11/20/21     triamcinolone cream (KENALOG) 0.1 % Apply 1 Application topically 2 (two) times daily. 01/06/22   Nyoka Lint, PA-C  gabapentin (NEURONTIN) 300 MG capsule Take 1 capsule (300 mg total) by mouth 3 (three) times daily. Patient not taking: Reported on 07/27/2019 07/29/18 08/30/19  [name], FNP  insulin detemir (LEVEMIR FLEXTOUCH) 100 UNIT/ML FlexPen Inject 35 Units into the skin daily. Patient not taking: Reported on 02/17/2021 11/29/20 02/24/21  [name], MD  sildenafil (VIAGRA) 25 MG tablet Take 1 tablet (25 mg total) by mouth as needed for erectile dysfunction. for erectile dysfunction. Patient not taking: Reported on 08/11/2020 10/13/19 11/27/20  [name], FNP  sitaGLIPtin (JANUVIA) 25 MG tablet Take 1 tablet (25 mg total) by mouth daily. Patient not taking: Reported on 08/11/2020 02/22/20 11/27/20  [name], MD      Allergies    Sulfa antibiotics, Keflex [cephalexin], Other, and Bactrim [sulfamethoxazole-trimethoprim]    Review of Systems   Review of Systems  Respiratory:  Positive for shortness of breath.   Gastrointestinal:  Positive for nausea and vomiting.  All other systems reviewed and are negative.   Physical Exam Updated Vital Signs BP (!) 195/102   Pulse (!) 101   Temp 99.9 F (37.7 C) (Oral)   Resp 18   SpO2 95%   Physical Exam Vitals and nursing note reviewed.  Constitutional:      Appearance: He is well-developed.     Comments: Sleeping, awoken for exam  HENT:     Head: Normocephalic and atraumatic.  Eyes:     Conjunctiva/sclera: Conjunctivae normal.     Pupils: Pupils are equal, round, and reactive to light.  Cardiovascular:     Rate and Rhythm: Normal rate  and regular rhythm.     Heart sounds: Normal heart sounds.  Pulmonary:     Effort: Pulmonary effort is normal. No respiratory distress.     Breath sounds: Normal breath sounds. No rhonchi.     Comments: Lungs grossly clear, NAD noted Abdominal:     General: Bowel sounds are normal.     Palpations: Abdomen is soft.  Musculoskeletal:        General: Normal range of motion.     Cervical back: Normal range of motion.  Skin:    General: Skin is warm and dry.  Neurological:     Mental Status: He is alert and oriented to person, place, and time.     ED Results / Procedures / Treatments   Labs (all labs ordered are listed, but only abnormal results are displayed) Labs Reviewed  CBC WITH DIFFERENTIAL/PLATELET - Abnormal; Notable for the following components:      Result Value   Hemoglobin 12.3 (*)    All other components within normal limits  COMPREHENSIVE METABOLIC PANEL - Abnormal; Notable for the following components:   CO2 21 (*)    Glucose, Bld 156 (*)    BUN 76 (*)    Creatinine, Ser 10.50 (*)    Calcium 7.9 (*)    Albumin 3.1 (*)    ALT 52 (*)    Alkaline Phosphatase 361 (*)    GFR, Estimated 6 (*)    Anion gap 20 (*)    All other components within normal limits    EKG None  Radiology DG Chest 2 View  Result Date: 01/18/2022 CLINICAL DATA:  Shortness of breath.  End-stage renal disease. EXAM: CHEST - 2 VIEW COMPARISON:  Chest radiograph 3 days ago 01/15/2022. FINDINGS: Unchanged cardiomegaly. Stable mediastinal contours. There is increasing patchy airspace disease in the right mid lung. Small left pleural effusion. Slight worsening vascular congestion. No pneumothorax. IMPRESSION: 1. Increased patchy airspace disease in the right mid lung, suspicious for pneumonia. 2. Cardiomegaly with slight worsening vascular congestion. Small left pleural effusion. Electronically Signed   By: Keith Rake M.D.   On: 01/18/2022 23:39    Procedures Procedures    Medications  Ordered in ED Medications  ondansetron (ZOFRAN-ODT) disintegrating tablet 4 mg (4 mg Oral Given 01/18/22 2059)    ED Course/ Medical Decision Making/ A&P                           Medical Decision Making Amount and/or Complexity of Data Reviewed Radiology: ordered and independent interpretation performed. ECG/medicine tests: ordered and independent interpretation performed.  Risk Prescription drug management.   51 year old male presenting to the ED with shortness of breath.  Has history of ESRD on hemodialysis.  He is well-known to this ED with frequent visits for same.  Initially reported his treatment  was cut short 45 minutes yesterday, and then tells me he did not have treatment at all.  He is sleeping on initial exam, awoken is not in any acute respiratory distress.  Labs were sent from triage and are overall reassuring, potassium is normal at 4.5.  EKG without any peaked T waves.  He denies any chest pain.  Chest x-ray does have infiltrate right midlung, suspicious for pneumonia.  He does report cough recently but this was not observed here in the ED.  Denies sick contacts or covid exposures.  He is afebrile, no respiratory distress, not requiring supplemental oxygen.  He does not have any indication for emergent dialysis today.  I feel it is reasonable to discharge home with treatment for pneumonia as an outpatient with doxycycline.  He was encouraged to have his usual dialysis treatment tomorrow as scheduled.  Can follow-up with PCP.  Return here for new concerns.  Final Clinical Impression(s) / ED Diagnoses Final diagnoses:  ESRD on dialysis [facility])  Community acquired pneumonia of right upper lobe of lung    Rx / DC Orders ED Discharge Orders          Ordered    doxycycline (VIBRAMYCIN) 100 MG capsule  2 times daily        01/19/22 0032              Larene Pickett, PA-C 37/04/88 [id number]    Lianne Cure, DO 94/50/38 1519

## 2022-01-18 NOTE — ED Notes (Signed)
Out to XR

## 2022-01-18 NOTE — ED Provider Triage Note (Signed)
Emergency Medicine Provider Triage Evaluation Note  [name] , a 51 y.o. male  was evaluated in triage.  Pt complains of nausea and vomiting on dialysis. Last dialyzed yesterday but only received part of treatment and left 45 minutes earlier. States he needs oxygen but is currently homeless  Review of Systems  Positive: Sob, nausea, vomiting Negative: Fever, cough  Physical Exam  BP (!) 195/102   Pulse (!) 101   Temp 99.9 F (37.7 C) (Oral)   Resp 18   SpO2 95%  Gen:   Awake, no distress   Resp:  Normal effort  MSK:   Moves extremities without difficulty  Other:    Medical Decision Making  Medically screening exam initiated at 9:05 PM.  Appropriate orders placed.  [name] was informed that the remainder of the evaluation will be completed by another provider, this initial triage assessment does not replace that evaluation, and the importance of remaining in the ED until their evaluation is complete.     Janeece Fitting, PA-C 01/18/22 2106

## 2022-01-19 MED ORDER — DOXYCYCLINE HYCLATE 100 MG PO TABS
100.0000 mg | ORAL_TABLET | Freq: Two times a day (BID) | ORAL | 0 refills | Status: DC
Start: 2022-01-19 — End: 2022-04-25
  Filled 2022-01-19: qty 20, 10d supply, fill #0

## 2022-01-19 NOTE — ED Notes (Signed)
Patient verbalizes understanding of discharge instructions. Opportunity for questioning and answers were provided. Pt discharged from ED. Pt taken to ED entrance via wheelchair.

## 2022-01-19 NOTE — Discharge Instructions (Signed)
Your labs today were normal, you need to have your regular scheduled dialysis tomorrow. Take the prescribed medication as directed. Follow-up with your primary care doctor. Return to the ED for new or worsening symptoms.

## 2022-01-20 ENCOUNTER — Emergency Department (HOSPITAL_COMMUNITY): Payer: Commercial Managed Care - HMO

## 2022-01-20 ENCOUNTER — Emergency Department (HOSPITAL_COMMUNITY)
Admission: EM | Admit: 2022-01-20 | Discharge: 2022-01-20 | Disposition: A | Discharge status: Home / Self Care | Payer: Commercial Managed Care - HMO | Attending: Emergency Medicine | Admitting: Emergency Medicine

## 2022-01-20 ENCOUNTER — Encounter (HOSPITAL_COMMUNITY): Payer: Self-pay

## 2022-01-20 ENCOUNTER — Other Ambulatory Visit: Payer: Self-pay

## 2022-01-20 DIAGNOSIS — Z91158 Patient's noncompliance with renal dialysis for other reason: Secondary | ICD-10-CM | POA: Diagnosis not present

## 2022-01-20 DIAGNOSIS — N186 End stage renal disease: Secondary | ICD-10-CM | POA: Diagnosis not present

## 2022-01-20 DIAGNOSIS — Z794 Long term (current) use of insulin: Secondary | ICD-10-CM | POA: Diagnosis not present

## 2022-01-20 DIAGNOSIS — I120 Hypertensive chronic kidney disease with stage 5 chronic kidney disease or end stage renal disease: Secondary | ICD-10-CM | POA: Diagnosis present

## 2022-01-20 DIAGNOSIS — E1122 Type 2 diabetes mellitus with diabetic chronic kidney disease: Secondary | ICD-10-CM | POA: Insufficient documentation

## 2022-01-20 LAB — CBC WITH DIFFERENTIAL/PLATELET
Abs Immature Granulocytes: 0.03 10*3/uL (ref 0.00–0.07)
Basophils Absolute: 0 10*3/uL (ref 0.0–0.1)
Basophils Relative: 0 %
Eosinophils Absolute: 0.2 10*3/uL (ref 0.0–0.5)
Eosinophils Relative: 4 %
HCT: 35.9 % — ABNORMAL LOW (ref 39.0–52.0)
Hemoglobin: 11.5 g/dL — ABNORMAL LOW (ref 13.0–17.0)
Immature Granulocytes: 1 %
Lymphocytes Relative: 18 %
Lymphs Abs: 0.9 10*3/uL (ref 0.7–4.0)
MCH: 29.3 pg (ref 26.0–34.0)
MCHC: 32 g/dL (ref 30.0–36.0)
MCV: 91.6 fL (ref 80.0–100.0)
Monocytes Absolute: 0.8 10*3/uL (ref 0.1–1.0)
Monocytes Relative: 15 %
Neutro Abs: 3.3 10*3/uL (ref 1.7–7.7)
Neutrophils Relative %: 62 %
Platelets: 256 10*3/uL (ref 150–400)
RBC: 3.92 MIL/uL — ABNORMAL LOW (ref 4.22–5.81)
RDW: 14.6 % (ref 11.5–15.5)
WBC: 5.2 10*3/uL (ref 4.0–10.5)
nRBC: 0 % (ref 0.0–0.2)

## 2022-01-20 LAB — COMPREHENSIVE METABOLIC PANEL WITH GFR
ALT: 40 U/L (ref 0–44)
AST: 31 U/L (ref 15–41)
Albumin: 2.6 g/dL — ABNORMAL LOW (ref 3.5–5.0)
Alkaline Phosphatase: 316 U/L — ABNORMAL HIGH (ref 38–126)
Anion gap: 15 (ref 5–15)
BUN: 91 mg/dL — ABNORMAL HIGH (ref 6–20)
CO2: 18 mmol/L — ABNORMAL LOW (ref 22–32)
Calcium: 7.2 mg/dL — ABNORMAL LOW (ref 8.9–10.3)
Chloride: 107 mmol/L (ref 98–111)
Creatinine, Ser: 11.96 mg/dL — ABNORMAL HIGH (ref 0.61–1.24)
GFR, Estimated: 5 mL/min — ABNORMAL LOW
Glucose, Bld: 195 mg/dL — ABNORMAL HIGH (ref 70–99)
Potassium: 4.6 mmol/L (ref 3.5–5.1)
Sodium: 140 mmol/L (ref 135–145)
Total Bilirubin: 0.4 mg/dL (ref 0.3–1.2)
Total Protein: 6.2 g/dL — ABNORMAL LOW (ref 6.5–8.1)

## 2022-01-20 NOTE — ED Notes (Signed)
RN asked pt if he has hx of afib or irregular heart beats. Pt states no hx. Pt heart rhythm is irregular and beats 107-155.  EDP is aware

## 2022-01-20 NOTE — ED Triage Notes (Signed)
Pt bib ems from [facility] d/t missed dialysis. HD T/TH/Sat Pt was unable to go to dialysis appt d/t running late for Saturday and forgot the appt on Thurs.  Pt states he is homeless and needs resources for shelter.  BP 224/100 HR 82 RR 16 RA 92% CBG 168 Hx DM

## 2022-01-20 NOTE — Discharge Instructions (Addendum)
You were seen in the ER for your missed dialysis appointment. Your lab work shows your elevated kidney function, but does not require emergent dialysis. You will need to call your dialysis center tomorrow to schedule an appointment. Please make sure you are taking your antibiotics. I have included shelter resources to this discharge paperwork. If you have any concerns, new or worsening symptoms, please return to the ER for evaluation.

## 2022-01-20 NOTE — ED Notes (Signed)
Patient given discharge instructions, all questions answered. Patient in possession of all belongings, directed to the discharge area  

## 2022-01-20 NOTE — ED Notes (Signed)
Pt O2 85% on room air. RN applied 2L nasal canula 98%

## 2022-01-20 NOTE — ED Notes (Signed)
Pt states he has abdominal pain on and off. Pt denies any bleeding from rectum or irregular BM

## 2022-01-20 NOTE — ED Notes (Signed)
EDP removed pt off Nasal Canula. Pt states he has hx of CHF. O2 94% room air.

## 2022-01-20 NOTE — ED Notes (Signed)
Security called to the bedside to assist with getting the patient discharged. NP at bedside to reiterate that patient does not need dialysis and he needs too call and get an appointment for dialysis tomorrow.

## 2022-01-20 NOTE — ED Notes (Signed)
Pt ambulating in the room with a steady gait. Pt maintained O2 98%

## 2022-01-20 NOTE — ED Provider Notes (Signed)
[facility] EMERGENCY DEPARTMENT Provider Note   CSN: [id number] Arrival date & time: 01/20/22  1227     History  Chief Complaint  Patient presents with   Missed Dialysis    [name] is a 51 y.o. male with a past medical history of ESRD on HD, type 2 diabetes, hypertension who presents to the emergency department with concerns for missing his dialysis.  Has been evaluated multiple times in the emergency department for this complaint.  Typically goes on Tuesday, Thursday, Saturday, did not go to his Thursday or Saturday sessions.  Patient denies chest pain, shortness of breath.  The history is provided by the patient. No language interpreter was used.       Home Medications Prior to Admission medications   Medication Sig Start Date End Date Taking? Authorizing Provider  amLODipine (NORVASC) 10 MG tablet Take 1 tablet by mouth once a day 11/20/21     amoxicillin-clavulanate (AUGMENTIN) 500-125 MG tablet Take 1 tablet (500 mg total) by mouth daily. 11/08/21   [name], MD  doxycycline (VIBRAMYCIN) 100 MG capsule Take 1 capsule (100 mg total) by mouth 2 (two) times daily. 01/19/22   Larene Pickett, PA-C  glucose blood test strip Use to check fasting blood sugar once daily. 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 Patient not taking: Reported on 09/28/2021 02/26/21   [name], [name], MD  insulin glargine (LANTUS) 100 UNIT/ML Solostar Pen Inject 10 Units into the skin daily. May substitute with any other long-acting formulary. Patient not taking: Reported on 09/28/2021 05/13/21   [name], MD  Insulin Pen Needle 32G X 4 MM MISC Use with insulin pens 02/26/21   [name], [name], MD  RENVELA 800 MG tablet Take 800-1,600 mg by mouth See admin instructions. Take 2 tablets (1600 mg) three times a day with meals  and 1 tablet (800 mg) twice a day as needed with snacks Patient not taking: Reported on 11/06/2021 10/25/21   [provider]  sevelamer carbonate (RENVELA) 800 MG tablet Take 2 tablet by mouth three times a day with meals and 1 tablet twice daily as needed with snacks 11/20/21     triamcinolone cream (KENALOG) 0.1 % Apply 1 Application topically 2 (two) times daily. 01/06/22   Nyoka Lint, PA-C  gabapentin (NEURONTIN) 300 MG capsule Take 1 capsule (300 mg total) by mouth 3 (three) times daily. Patient not taking: Reported on 07/27/2019 07/29/18 08/30/19  [name], FNP  insulin detemir (LEVEMIR FLEXTOUCH) 100 UNIT/ML FlexPen Inject 35 Units into the skin daily. Patient not taking: Reported on 02/17/2021 11/29/20 02/24/21  [name], MD  sildenafil (VIAGRA) 25 MG tablet Take 1 tablet (25 mg total) by mouth as needed for erectile dysfunction. for erectile dysfunction. Patient not taking: Reported on 08/11/2020 10/13/19 11/27/20  [name], FNP  sitaGLIPtin (JANUVIA) 25 MG tablet Take 1 tablet (25 mg total) by mouth daily. Patient not taking: Reported on 08/11/2020 02/22/20 11/27/20  [name], MD      Allergies    Sulfa antibiotics, Keflex [cephalexin], Other, and Bactrim [sulfamethoxazole-trimethoprim]    Review of Systems   Review of Systems  Physical Exam Updated Vital Signs BP (!) 181/99 (BP Location: Left Arm)   Pulse 88   Temp 97.9 F (36.6 C) (Oral)   Resp  14   Ht 5\' 7"  (1.702 m)   Wt 72.6 kg   SpO2 98%   BMI 25.06 kg/m  Physical Exam  ED Results / Procedures / Treatments   Labs (all labs ordered are listed, but only abnormal results are displayed) Labs Reviewed - No data to display  EKG None  Radiology DG Chest 2 View  Result Date: 01/18/2022 CLINICAL DATA:  Shortness of breath.  End-stage renal disease. EXAM: CHEST - 2 VIEW COMPARISON:  Chest radiograph 3 days ago 01/15/2022. FINDINGS: Unchanged cardiomegaly. Stable mediastinal contours.  There is increasing patchy airspace disease in the right mid lung. Small left pleural effusion. Slight worsening vascular congestion. No pneumothorax. IMPRESSION: 1. Increased patchy airspace disease in the right mid lung, suspicious for pneumonia. 2. Cardiomegaly with slight worsening vascular congestion. Small left pleural effusion. Electronically Signed   By: Keith Rake M.D.   On: 01/18/2022 23:39    Procedures Procedures    Medications Ordered in ED Medications - No data to display  ED Course/ Medical Decision Making/ A&P                           Medical Decision Making Amount and/or Complexity of Data Reviewed Labs: ordered. Radiology: ordered.   Pt presents with concerns for missing his dialysis on Tuesday, Thursday, Saturday.  Notes that he did not go to his dialysis on Thursday or Saturday.  Patient denies chest pain or shortness of breath at this time.  Patient afebrile.  On exam patient without acute cardiovascular, respiratory, abdominal exam findings.     Co morbidities that complicate the patient evaluation: ESRD on HD Type 2 diabetes Hypertension LBBB  Additional history obtained:  External records from outside source obtained and reviewed including: Pt was evaluated in the ED on 01/18/22 for similar concerns. At that time his potassium was 4.5 and stable. It was recommended that he follow up in the out patient setting.   Labs:  I ordered, and personally interpreted labs.  The pertinent results include:   CBC unremarkable CMP ordered results pending at time of signout  Patient case discussed with Sherrell Puller, PA-C at sign-out. Plan at sign-out is pending labs and consult with nephrology if abnormal labs, however, plans may change as per oncoming team. Patient care transferred at sign out.     This chart was dictated using voice recognition software, Dragon. Despite the best efforts of this provider to proofread and correct errors, errors may still occur  which can change documentation meaning.   Final Clinical Impression(s) / ED Diagnoses Final diagnoses:  None    Rx / DC Orders ED Discharge Orders     None         Wrangler Penning A, PA-C 01/20/22 1612    Regan Lemming, MD 01/20/22 1858

## 2022-01-20 NOTE — ED Provider Notes (Addendum)
Physical Exam  BP (!) 178/103   Pulse 80   Temp 97.8 F (36.6 C) (Oral)   Resp 14   Ht 5' 7"  (1.702 m)   Wt 72.6 kg   SpO2 94%   BMI 25.06 kg/m   Physical Exam Vitals and nursing note reviewed.  Constitutional:      General: He is not in acute distress.    Appearance: Normal appearance. He is not ill-appearing or toxic-appearing.     Comments: Sleeping, but awakens to voice.  Eyes:     General: No scleral icterus. Cardiovascular:     Rate and Rhythm: Normal rate.  Pulmonary:     Effort: Pulmonary effort is normal. No respiratory distress.     Breath sounds: Normal breath sounds.  Skin:    General: Skin is dry.     Findings: No rash.  Neurological:     General: No focal deficit present.     Mental Status: He is alert. Mental status is at baseline.  Psychiatric:        Mood and Affect: Mood normal.     Procedures  Procedures  ED Course / MDM    Medical Decision Making Amount and/or Complexity of Data Reviewed Labs: ordered. Radiology: ordered.    Accepted handoff at shift change from [facility]. Please see prior provider note for more detail.   Briefly: Patient is 51 y.o. M here for two missed dialysis appointments.   DDX: concern for electrolyte abnormality requiring emergent dialysis.   Plan: Follow up on labs and consult nephrology.  I spoke with the patient who denies any symptoms, he reports he has missed his last two dialysis appointments and is looking for food and shelter resources. Denies any chest pain, SOB.  I independently reviewed and interpreted the patient's labs.  ECG shows mild anemia 11.5 although around patient's baseline.  No leukocytosis.  CMP shows normal potassium at 4.6.  Mild recent bicarb at 18.  Normal anion gap.  Glucose elevated at 195.  Creatinine significantly elevated at 11.96 although appears to be around patient's baseline.  Hypocalcemia at 7.2.  Decrease in total protein and albumin.  Alk phos elevated at 316.  His  labs are comparable to patient's labs for the past few days.  I called nephrology and spoke with Dr. Johnney Ou about this patient. She does not see that this patient needs nay emergent dialysis and can follow up with his dialysis center.   Unsure of what caused this low oxygen nation at first at 47 although could be not a good pleth.  Patient has been here for several hours afterwards and has remained a steady O2 sat.  Ambulatory pulse ox at 98%.  Lung sounds are normal.  Patient in no acute distress.  Speaking in full sentence with ease.  On reevaluation, patient was put on doxycycline for pneumonia however he does not have a white count, fever, or tachycardia, doubt any sepsis or any inpatient admission requiring admission for pneumonia.  Nephrology does not see any emergent need for dialysis reports he is safe for discharge.  Patient instructed to follow-up with dialysis center to schedule an appointment for his missed treatment.  Return precautions given.  Patient stable and being discharged home in good condition.  I discussed this case with my attending physician who cosigned this note including patient's presenting symptoms, physical exam, and planned diagnostics and interventions. Attending physician stated agreement with plan or made changes to plan which were implemented.  Sherrell Puller, PA-C 01/20/22 2030    Sherrell Puller, PA-C 01/20/22 2032    Regan Lemming, MD 01/20/22 2156    Regan Lemming, MD 01/20/22 2157

## 2022-01-21 ENCOUNTER — Ambulatory Visit (HOSPITAL_COMMUNITY)
Admission: EM | Admit: 2022-01-21 | Discharge: 2022-01-21 | Disposition: A | Discharge status: Home / Self Care | Payer: Commercial Managed Care - HMO | Attending: Urology | Admitting: Urology

## 2022-01-21 DIAGNOSIS — F4321 Adjustment disorder with depressed mood: Secondary | ICD-10-CM

## 2022-01-21 MED ORDER — AMLODIPINE BESYLATE 10 MG PO TABS: 10.0000 mg | ORAL_TABLET | Freq: Every day | ORAL | 0 refills | Status: DC

## 2022-01-21 MED ORDER — ESCITALOPRAM OXALATE 5 MG PO TABS
5.0000 mg | ORAL_TABLET | Freq: Every day | ORAL | 1 refills | Status: DC
  Filled 2022-01-21: qty 30, 30d supply, fill #0

## 2022-01-21 MED ORDER — ESCITALOPRAM OXALATE 5 MG PO TABS: 5.0000 mg | ORAL_TABLET | Freq: Every day | ORAL | 1 refills | Status: DC

## 2022-01-21 MED ORDER — AMLODIPINE BESYLATE 10 MG PO TABS
10.0000 mg | ORAL_TABLET | Freq: Every day | ORAL | 0 refills | Status: DC
  Filled 2022-01-21: qty 12, 12d supply, fill #0

## 2022-01-21 MED ORDER — AMLODIPINE BESYLATE 10 MG PO TABS
10.0000 mg | ORAL_TABLET | Freq: Once | ORAL | Status: AC
  Administered 2022-01-21: 10 mg via ORAL
  Filled 2022-01-21: qty 1

## 2022-01-21 MED ORDER — AMLODIPINE BESYLATE 5 MG PO TABS: 5.0000 mg | ORAL_TABLET | Freq: Once | ORAL | Status: DC

## 2022-01-21 NOTE — ED Notes (Signed)
[employer] transport requested. 
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
## 2022-01-21 NOTE — ED Notes (Signed)
Pt was given a bus ticket, resources, and a prescription. [employer] was called to transport pt, [place] ([employer]) called and stated that he could not come back and get him due to him being picked up and  transported to [facility] and when he got d/c he called to be transported to [facility]. He was picked up at the shelter and the shelter starts serving breakfast at 830, so at this time he has nowhere to take him. When we went to his room to d/c pt refused to sit up and leave the premises. Security was called and after much of talking he left. He refused his AVS, resources for shelters. He took the bus ticket but wanted more and I explained it is one per pt. Pt was very upset. He stated I thought if people come and say they SI they can get a bed

## 2022-01-21 NOTE — Discharge Instructions (Signed)

## 2022-01-21 NOTE — ED Triage Notes (Signed)
Pt presents to [facility] voluntarily, accompanied by [employer]. Pt reports being discharged from [facility] and was there due to missed dialysis appointments. Pt reports being depressed and grieving the loss of his mother. Pt reports being suicidal, but does not have a plan. Pt denies HI, AVH and substance/alcohol use.

## 2022-01-22 ENCOUNTER — Emergency Department (HOSPITAL_COMMUNITY)
Admission: EM | Admit: 2022-01-22 | Discharge: 2022-01-22 | Disposition: A | Discharge status: Home / Self Care | Payer: Commercial Managed Care - HMO | Attending: Emergency Medicine | Admitting: Emergency Medicine

## 2022-01-22 ENCOUNTER — Emergency Department (HOSPITAL_COMMUNITY): Payer: Commercial Managed Care - HMO

## 2022-01-22 ENCOUNTER — Encounter (HOSPITAL_COMMUNITY): Payer: Self-pay | Admitting: Emergency Medicine

## 2022-01-22 ENCOUNTER — Other Ambulatory Visit: Payer: Self-pay

## 2022-01-22 DIAGNOSIS — Z992 Dependence on renal dialysis: Secondary | ICD-10-CM | POA: Insufficient documentation

## 2022-01-22 DIAGNOSIS — Z5321 Procedure and treatment not carried out due to patient leaving prior to being seen by health care provider: Secondary | ICD-10-CM | POA: Insufficient documentation

## 2022-01-22 DIAGNOSIS — R0602 Shortness of breath: Secondary | ICD-10-CM | POA: Insufficient documentation

## 2022-01-22 NOTE — ED Notes (Signed)
[name] LEFT AMA

## 2022-01-22 NOTE — ED Triage Notes (Signed)
Patient arrived with EMS from street reports SOB this evening , he missed 2 hemodialysis treatment .

## 2022-01-26 ENCOUNTER — Emergency Department (HOSPITAL_COMMUNITY)
Admission: EM | Admit: 2022-01-26 | Discharge: 2022-01-27 | Discharge status: Against Medical Advice | Payer: Commercial Managed Care - HMO | Attending: Emergency Medicine | Admitting: Emergency Medicine

## 2022-01-26 ENCOUNTER — Other Ambulatory Visit: Payer: Self-pay

## 2022-01-26 ENCOUNTER — Encounter (HOSPITAL_COMMUNITY): Payer: Self-pay | Admitting: Emergency Medicine

## 2022-01-26 DIAGNOSIS — Z5321 Procedure and treatment not carried out due to patient leaving prior to being seen by health care provider: Secondary | ICD-10-CM | POA: Insufficient documentation

## 2022-01-26 DIAGNOSIS — Z992 Dependence on renal dialysis: Secondary | ICD-10-CM | POA: Diagnosis not present

## 2022-01-26 DIAGNOSIS — R1084 Generalized abdominal pain: Secondary | ICD-10-CM | POA: Diagnosis not present

## 2022-01-26 DIAGNOSIS — R140 Abdominal distension (gaseous): Secondary | ICD-10-CM | POA: Diagnosis not present

## 2022-01-26 DIAGNOSIS — R2243 Localized swelling, mass and lump, lower limb, bilateral: Secondary | ICD-10-CM | POA: Diagnosis present

## 2022-01-26 LAB — COMPREHENSIVE METABOLIC PANEL
ALT: 37 U/L (ref 0–44)
AST: 37 U/L (ref 15–41)
Albumin: 2.7 g/dL — ABNORMAL LOW (ref 3.5–5.0)
Alkaline Phosphatase: 337 U/L — ABNORMAL HIGH (ref 38–126)
Anion gap: 17 — ABNORMAL HIGH (ref 5–15)
BUN: 82 mg/dL — ABNORMAL HIGH (ref 6–20)
CO2: 20 mmol/L — ABNORMAL LOW (ref 22–32)
Calcium: 6.6 mg/dL — ABNORMAL LOW (ref 8.9–10.3)
Chloride: 104 mmol/L (ref 98–111)
Creatinine, Ser: 12.15 mg/dL — ABNORMAL HIGH (ref 0.61–1.24)
GFR, Estimated: 5 mL/min — ABNORMAL LOW (ref >60)
Glucose, Bld: 296 mg/dL — ABNORMAL HIGH (ref 70–99)
Potassium: 4.8 mmol/L (ref 3.5–5.1)
Sodium: 141 mmol/L (ref 135–145)
Total Bilirubin: 0.3 mg/dL (ref 0.3–1.2)
Total Protein: 7.1 g/dL (ref 6.5–8.1)

## 2022-01-26 LAB — CBC WITH DIFFERENTIAL/PLATELET
Abs Immature Granulocytes: 0.03 10*3/uL (ref 0.00–0.07)
Basophils Absolute: 0 10*3/uL (ref 0.0–0.1)
Basophils Relative: 1 %
Eosinophils Absolute: 0.3 10*3/uL (ref 0.0–0.5)
Eosinophils Relative: 5 %
HCT: 38.9 % — ABNORMAL LOW (ref 39.0–52.0)
Hemoglobin: 12.1 g/dL — ABNORMAL LOW (ref 13.0–17.0)
Immature Granulocytes: 1 %
Lymphocytes Relative: 14 %
Lymphs Abs: 0.8 10*3/uL (ref 0.7–4.0)
MCH: 29.2 pg (ref 26.0–34.0)
MCHC: 31.1 g/dL (ref 30.0–36.0)
MCV: 94 fL (ref 80.0–100.0)
Monocytes Absolute: 0.6 10*3/uL (ref 0.1–1.0)
Monocytes Relative: 10 %
Neutro Abs: 4.1 10*3/uL (ref 1.7–7.7)
Neutrophils Relative %: 69 %
Platelets: 318 10*3/uL (ref 150–400)
RBC: 4.14 MIL/uL — ABNORMAL LOW (ref 4.22–5.81)
RDW: 14.6 % (ref 11.5–15.5)
WBC: 5.9 10*3/uL (ref 4.0–10.5)
nRBC: 0 % (ref 0.0–0.2)

## 2022-01-26 NOTE — ED Triage Notes (Signed)
Patient missed 2 hemodialysis treatment , reports bilateral feet swelling. Respirations unlabored.

## 2022-01-26 NOTE — ED Provider Triage Note (Signed)
Emergency Medicine Provider Triage Evaluation Note  JABEN BENEGAS , a 51 y.o. male  was evaluat  Review of Systems  Positive:  Negative:   Physical Exam  BP (!) 185/93   Pulse 85   Temp 98.3 F (36.8 C)   Resp 18   SpO2 94%  Gen:   Awake, no distress   Resp:  Normal effort  MSK:   Moves extremities without difficulty  Other:  Right upper extremity with AV fistula with palpable thrill  Medical Decision Making  Medically screening exam initiated at 10:15 PM.  Appropriate orders placed.  MARIUS BETTS was informed that the remainder of the evaluation will be completed by another provider, this initial triage assessment does not replace that evaluation, and the importance of remaining in the ED until their evaluation is complete.     Sherrill Raring, PA-C 01/26/22 2215

## 2022-01-27 ENCOUNTER — Encounter (HOSPITAL_COMMUNITY): Payer: Self-pay

## 2022-01-27 ENCOUNTER — Other Ambulatory Visit: Payer: Self-pay

## 2022-01-27 ENCOUNTER — Emergency Department (HOSPITAL_COMMUNITY)
Admission: EM | Admit: 2022-01-27 | Discharge: 2022-01-28 | Disposition: A | Discharge status: Home / Self Care | Payer: Commercial Managed Care - HMO | Source: Home / Self Care | Attending: Emergency Medicine | Admitting: Emergency Medicine

## 2022-01-27 DIAGNOSIS — R1084 Generalized abdominal pain: Secondary | ICD-10-CM | POA: Insufficient documentation

## 2022-01-27 DIAGNOSIS — Z992 Dependence on renal dialysis: Secondary | ICD-10-CM | POA: Insufficient documentation

## 2022-01-27 DIAGNOSIS — R140 Abdominal distension (gaseous): Secondary | ICD-10-CM | POA: Insufficient documentation

## 2022-01-27 LAB — BASIC METABOLIC PANEL
Anion gap: 16 — ABNORMAL HIGH (ref 5–15)
BUN: 85 mg/dL — ABNORMAL HIGH (ref 6–20)
CO2: 19 mmol/L — ABNORMAL LOW (ref 22–32)
Calcium: 6.6 mg/dL — ABNORMAL LOW (ref 8.9–10.3)
Chloride: 106 mmol/L (ref 98–111)
Creatinine, Ser: 12.27 mg/dL — ABNORMAL HIGH (ref 0.61–1.24)
GFR, Estimated: 5 mL/min — ABNORMAL LOW (ref >60)
Glucose, Bld: 193 mg/dL — ABNORMAL HIGH (ref 70–99)
Potassium: 4.4 mmol/L (ref 3.5–5.1)
Sodium: 141 mmol/L (ref 135–145)

## 2022-01-27 LAB — CBC WITH DIFFERENTIAL/PLATELET
Abs Immature Granulocytes: 0.02 10*3/uL (ref 0.00–0.07)
Basophils Absolute: 0 10*3/uL (ref 0.0–0.1)
Basophils Relative: 1 %
Eosinophils Absolute: 0.2 10*3/uL (ref 0.0–0.5)
Eosinophils Relative: 4 %
HCT: 40.1 % (ref 39.0–52.0)
Hemoglobin: 12.4 g/dL — ABNORMAL LOW (ref 13.0–17.0)
Immature Granulocytes: 0 %
Lymphocytes Relative: 13 %
Lymphs Abs: 0.7 10*3/uL (ref 0.7–4.0)
MCH: 29.1 pg (ref 26.0–34.0)
MCHC: 30.9 g/dL (ref 30.0–36.0)
MCV: 94.1 fL (ref 80.0–100.0)
Monocytes Absolute: 0.5 10*3/uL (ref 0.1–1.0)
Monocytes Relative: 10 %
Neutro Abs: 3.5 10*3/uL (ref 1.7–7.7)
Neutrophils Relative %: 72 %
Platelets: 273 10*3/uL (ref 150–400)
RBC: 4.26 MIL/uL (ref 4.22–5.81)
RDW: 14.7 % (ref 11.5–15.5)
WBC: 5 10*3/uL (ref 4.0–10.5)
nRBC: 0 % (ref 0.0–0.2)

## 2022-01-27 NOTE — ED Notes (Signed)
EKG completed in triage signed by MD but did not transmit.  Hard copy in triage

## 2022-01-27 NOTE — ED Notes (Signed)
Pt was called x3 no answer 

## 2022-01-27 NOTE — ED Notes (Signed)
Pt was called x2

## 2022-01-27 NOTE — ED Notes (Signed)
Pt outside on bench sleeping. When asked to take vitals pt refused

## 2022-01-27 NOTE — ED Triage Notes (Addendum)
Abdominal pain.  Mild distention tues thurs sat and has missed dialysis tues and Thursday bc of work. Patient is noncomplaint with meds and dialysis.  BP 184/110 CBG 210

## 2022-01-27 NOTE — ED Notes (Signed)
Pt refusing to come to triage for blood draw, not verbally responding to this tech

## 2022-01-28 ENCOUNTER — Emergency Department (HOSPITAL_COMMUNITY): Payer: Commercial Managed Care - HMO

## 2022-01-28 NOTE — Discharge Instructions (Signed)
You need to keep your regular dialysis appointments.  You can try contacting your dialysis center this morning to see if they can work you into the schedule, otherwise you need to go there on Tuesday for your treatment.  If your symptoms change or worsen you can come back for repeat evaluation at the emergency department.

## 2022-01-28 NOTE — ED Notes (Signed)
Appears alert and oreinted, eating candy, refuses to answer questions during assessment.

## 2022-01-28 NOTE — ED Provider Notes (Signed)
[facility] Emergency Department Provider Note MRN:  [id number]  Arrival date & time: 01/28/22     Chief Complaint   Abdominal Pain   History of Present Illness   [name] is a 51 y.o. year-old male presents to the ED with chief complaint of needing dialysis.  Patient states that he was last dialyzed about a week ago.  States that he has been unable to go to dialysis because of work conflicts.  He states that his abdomen is swollen and causing him to have pain.  He reports associated swelling on his legs.  No reported chest pain or shortness of breath.  Hx of noncompliance with dialysis.  History provided by patient.   Review of Systems  Pertinent positive and negative review of systems noted in HPI.    Physical Exam   Vitals:   01/27/22 2110 01/27/22 2308  BP:  (!) 176/87  Pulse:  82  Resp:  17  Temp: 98 F (36.7 C) 98 F (36.7 C)  SpO2:  98%    CONSTITUTIONAL:  non toxic-appearing, NAD NEURO:  Alert and oriented x 3, CN 3-12 grossly intact EYES:  eyes equal and reactive ENT/NECK:  Supple, no stridor  CARDIO:  normal rate, regular rhythm, appears well-perfused  PULM:  No respiratory distress, CTAB GI/GU:  very mild distension MSK/SPINE:  No gross deformities, 2+ edema in bilateral lower extremities SKIN:  no rash, atraumatic   *Additional and/or pertinent findings included in MDM below  Diagnostic and Interventional Summary    EKG Interpretation  Date/Time:    Ventricular Rate:    PR Interval:    QRS Duration:   QT Interval:    QTC Calculation:   R Axis:     Text Interpretation:         Labs Reviewed  CBC WITH DIFFERENTIAL/PLATELET - Abnormal; Notable for the following components:      Result Value   Hemoglobin 12.4 (*)    All other components within normal limits  BASIC METABOLIC PANEL - Abnormal; Notable for the following components:   CO2 19 (*)    Glucose, Bld 193 (*)    BUN 85 (*)    Creatinine, Ser 12.27 (*)     Calcium 6.6 (*)    GFR, Estimated 5 (*)    Anion gap 16 (*)    All other components within normal limits    DG Chest 2 View  Final Result      Medications - No data to display   Procedures  /  Critical Care Procedures  ED Course and Medical Decision Making  I have reviewed the triage vital signs, the nursing notes, and pertinent available records from the EMR.  Social Determinants Affecting Complexity of Care: Patient care affected by compliance with dialysis.   ED Course:    Medical Decision Making Patient here requesting dialysis.  He has missed his last 2 treatments for dialysis.  He is noted to be mildly hypertensive to 176/87.  He is not tachycardic, in no respiratory distress, and has normal O2 saturation at 98% on room air.  He does not appear toxic or uncomfortable.  He does not have a leukocytosis.  He has not been coughing on exam.  His chest x-ray appears similar to x-ray on 9/3.  I am less suspicious of pneumonia.  His potassium is 4.4.  After reviewing labs and discussing patient with Dr. Dayna Barker, do not feel that patient requires emergent dialysis.  I have encouraged  him to follow-up with his dialysis clinic and have urged him to be more compliant with getting his dialysis treatments.  Amount and/or Complexity of Data Reviewed Labs: ordered. Radiology: ordered.     Consultants: No consultations were needed in caring for this patient.   Treatment and Plan: I considered admission due to patient's initial presentation, but after considering the examination and diagnostic results, patient will not require admission and can be discharged with outpatient follow-up.  Patient discussed with attending physician, Dr. Dayna Barker, who agrees with plan for discharge.  Final Clinical Impressions(s) / ED Diagnoses     ICD-10-CM   1. Generalized abdominal pain  R10.84       ED Discharge Orders     None         Discharge Instructions Discussed with and Provided to  Patient:     Discharge Instructions      You need to keep your regular dialysis appointments.  You can try contacting your dialysis center this morning to see if they can work you into the schedule, otherwise you need to go there on Tuesday for your treatment.  If your symptoms change or worsen you can come back for repeat evaluation at the emergency department.       Montine Circle, PA-C 01/28/22 0242    Merrily Pew, MD 01/28/22 [phone]

## 2022-01-29 ENCOUNTER — Other Ambulatory Visit: Payer: Self-pay

## 2022-01-31 ENCOUNTER — Emergency Department (HOSPITAL_COMMUNITY): Payer: Commercial Managed Care - HMO

## 2022-01-31 ENCOUNTER — Emergency Department (HOSPITAL_COMMUNITY)
Admission: EM | Admit: 2022-01-31 | Discharge: 2022-01-31 | Disposition: A | Discharge status: Home / Self Care | Payer: Commercial Managed Care - HMO | Attending: Medical | Admitting: Medical

## 2022-01-31 ENCOUNTER — Other Ambulatory Visit: Payer: Self-pay

## 2022-01-31 ENCOUNTER — Encounter (HOSPITAL_COMMUNITY): Payer: Self-pay | Admitting: Emergency Medicine

## 2022-01-31 DIAGNOSIS — Z5321 Procedure and treatment not carried out due to patient leaving prior to being seen by health care provider: Secondary | ICD-10-CM | POA: Diagnosis not present

## 2022-01-31 DIAGNOSIS — R609 Edema, unspecified: Secondary | ICD-10-CM | POA: Diagnosis present

## 2022-01-31 NOTE — ED Provider Triage Note (Signed)
Emergency Medicine Provider Triage Evaluation Note  ALTAIR APPENZELLER , a 51 y.o. male  was evaluated in triage.  Pt complains of swelling all over. States this is chronic but that he hurts all over and wants further evaluation. Did not miss dialysis--goes T/Th/S. Denies shortness of breath.   Review of Systems  Positive: edema Negative: Shortness of breath  Physical Exam  There were no vitals taken for this visit. Gen:   Awake, no distress   Resp:  Normal effort  MSK:   Moves extremities without difficulty  Other:  2+pitting edema of BLE  Medical Decision Making  Medically screening exam initiated at 12:28 AM.  Appropriate orders placed.  MARKALE BIRDSELL was informed that the remainder of the evaluation will be completed by another provider, this initial triage assessment does not replace that evaluation, and the importance of remaining in the ED until their evaluation is complete.     Osvaldo Shipper, [place] 01/31/22 [phone]

## 2022-01-31 NOTE — ED Notes (Signed)
Patient states he is leaving to go to dialysis

## 2022-01-31 NOTE — ED Triage Notes (Signed)
Patient arrived with EMS from a bis stop , reports bilateral feet pain/swelling from walking and standing all day . He dis not miss his hemodialysis treatment. Respirations unlabored.

## 2022-02-01 ENCOUNTER — Emergency Department (HOSPITAL_COMMUNITY)
Admission: EM | Admit: 2022-02-01 | Discharge: 2022-02-02 | Discharge status: Against Medical Advice | Payer: Commercial Managed Care - HMO | Attending: Emergency Medicine | Admitting: Emergency Medicine

## 2022-02-01 DIAGNOSIS — Z5321 Procedure and treatment not carried out due to patient leaving prior to being seen by health care provider: Secondary | ICD-10-CM | POA: Insufficient documentation

## 2022-02-01 DIAGNOSIS — R0602 Shortness of breath: Secondary | ICD-10-CM | POA: Insufficient documentation

## 2022-02-01 NOTE — ED Notes (Signed)
Attempted again to bring pt to triage, pt ignoring this tech and continued to sleep

## 2022-02-01 NOTE — ED Notes (Signed)
Pt called to triage. Pt refusing to walk or get into wheelchair to be brought to triage. Security attempted to convince pt to come, pt continued to be argumentative and refusing to come back to triage stating he would rather sleep.

## 2022-02-01 NOTE — ED Triage Notes (Signed)
Pt BIB [facility] for SOB chronic, unchanged. He went to dialysis for 2 hours yesterday. Said he pulled off 3L. Denies any chest pain.   BP 196/100 98% RA HR 90 CBG 214

## 2022-02-01 NOTE — ED Notes (Signed)
Pt called multiple times for triage, no answer.

## 2022-02-02 ENCOUNTER — Encounter (HOSPITAL_COMMUNITY): Payer: Self-pay

## 2022-02-02 ENCOUNTER — Other Ambulatory Visit: Payer: Self-pay

## 2022-02-02 ENCOUNTER — Emergency Department (HOSPITAL_COMMUNITY)
Admission: EM | Admit: 2022-02-02 | Discharge: 2022-02-02 | Discharge status: Against Medical Advice | Payer: Commercial Managed Care - HMO | Attending: Emergency Medicine | Admitting: Emergency Medicine

## 2022-02-02 DIAGNOSIS — Z5321 Procedure and treatment not carried out due to patient leaving prior to being seen by health care provider: Secondary | ICD-10-CM | POA: Insufficient documentation

## 2022-02-02 DIAGNOSIS — M79669 Pain in unspecified lower leg: Secondary | ICD-10-CM | POA: Insufficient documentation

## 2022-02-02 NOTE — ED Triage Notes (Signed)
BIB [facility] with c/o chronic leg pain. Left AMA from [facility] earlier today. Has HD in the morning.

## 2022-02-02 NOTE — ED Notes (Signed)
Pt has been called for a room x 3 without answer. Removing from system

## 2022-02-02 NOTE — ED Notes (Signed)
Pt escorted to bus stop by [employer].

## 2022-02-03 ENCOUNTER — Emergency Department (HOSPITAL_COMMUNITY): Payer: Commercial Managed Care - HMO

## 2022-02-03 ENCOUNTER — Encounter (HOSPITAL_COMMUNITY): Payer: Self-pay

## 2022-02-03 ENCOUNTER — Emergency Department (HOSPITAL_COMMUNITY)
Admission: EM | Admit: 2022-02-03 | Discharge: 2022-02-04 | Discharge status: Against Medical Advice | Payer: Commercial Managed Care - HMO | Attending: Emergency Medicine | Admitting: Emergency Medicine

## 2022-02-03 ENCOUNTER — Other Ambulatory Visit: Payer: Self-pay

## 2022-02-03 DIAGNOSIS — R7989 Other specified abnormal findings of blood chemistry: Secondary | ICD-10-CM | POA: Diagnosis not present

## 2022-02-03 DIAGNOSIS — R2243 Localized swelling, mass and lump, lower limb, bilateral: Secondary | ICD-10-CM | POA: Diagnosis not present

## 2022-02-03 DIAGNOSIS — Z992 Dependence on renal dialysis: Secondary | ICD-10-CM | POA: Diagnosis not present

## 2022-02-03 DIAGNOSIS — N186 End stage renal disease: Secondary | ICD-10-CM | POA: Diagnosis not present

## 2022-02-03 DIAGNOSIS — E1122 Type 2 diabetes mellitus with diabetic chronic kidney disease: Secondary | ICD-10-CM | POA: Diagnosis not present

## 2022-02-03 DIAGNOSIS — R609 Edema, unspecified: Secondary | ICD-10-CM

## 2022-02-03 DIAGNOSIS — R079 Chest pain, unspecified: Secondary | ICD-10-CM | POA: Diagnosis present

## 2022-02-03 DIAGNOSIS — Z794 Long term (current) use of insulin: Secondary | ICD-10-CM | POA: Diagnosis not present

## 2022-02-03 DIAGNOSIS — I120 Hypertensive chronic kidney disease with stage 5 chronic kidney disease or end stage renal disease: Secondary | ICD-10-CM | POA: Diagnosis not present

## 2022-02-03 DIAGNOSIS — Z79899 Other long term (current) drug therapy: Secondary | ICD-10-CM | POA: Diagnosis not present

## 2022-02-03 DIAGNOSIS — R778 Other specified abnormalities of plasma proteins: Secondary | ICD-10-CM

## 2022-02-03 DIAGNOSIS — D631 Anemia in chronic kidney disease: Secondary | ICD-10-CM

## 2022-02-03 NOTE — ED Triage Notes (Signed)
Pt began having central chest pain, non radiating that started 45 minutes ago while walking. Pt received 324 asa and 0.4mg  nitro with EMS and no improvement. Denies SOB. Pt also missed dialysis yesterday and is c/o swelling to the legs.

## 2022-02-04 LAB — CBC
HCT: 37.1 % — ABNORMAL LOW (ref 39.0–52.0)
Hemoglobin: 12.1 g/dL — ABNORMAL LOW (ref 13.0–17.0)
MCH: 30.1 pg (ref 26.0–34.0)
MCHC: 32.6 g/dL (ref 30.0–36.0)
MCV: 92.3 fL (ref 80.0–100.0)
Platelets: 300 10*3/uL (ref 150–400)
RBC: 4.02 MIL/uL — ABNORMAL LOW (ref 4.22–5.81)
RDW: 14.6 % (ref 11.5–15.5)
WBC: 7.6 10*3/uL (ref 4.0–10.5)
nRBC: 0.7 % — ABNORMAL HIGH (ref 0.0–0.2)

## 2022-02-04 LAB — BASIC METABOLIC PANEL
Anion gap: 19 — ABNORMAL HIGH (ref 5–15)
BUN: 79 mg/dL — ABNORMAL HIGH (ref 6–20)
CO2: 16 mmol/L — ABNORMAL LOW (ref 22–32)
Calcium: 7.5 mg/dL — ABNORMAL LOW (ref 8.9–10.3)
Chloride: 104 mmol/L (ref 98–111)
Creatinine, Ser: 11.32 mg/dL — ABNORMAL HIGH (ref 0.61–1.24)
GFR, Estimated: 5 mL/min — ABNORMAL LOW (ref >60)
Glucose, Bld: 149 mg/dL — ABNORMAL HIGH (ref 70–99)
Potassium: 4.1 mmol/L (ref 3.5–5.1)
Sodium: 139 mmol/L (ref 135–145)

## 2022-02-04 LAB — TROPONIN I (HIGH SENSITIVITY)
Troponin I (High Sensitivity): 177 ng/L (ref <18)
Troponin I (High Sensitivity): 186 ng/L (ref <18)

## 2022-02-04 MED ORDER — LIDOCAINE HCL (PF) 1 % IJ SOLN: 5.0000 mL | INTRAMUSCULAR | Status: DC | PRN

## 2022-02-04 MED ORDER — CHLORHEXIDINE GLUCONATE CLOTH 2 % EX PADS: 6.0000 | MEDICATED_PAD | Freq: Every day | CUTANEOUS | Status: DC

## 2022-02-04 MED ORDER — LIDOCAINE-PRILOCAINE 2.5-2.5 % EX CREA: 1.0000 | TOPICAL_CREAM | CUTANEOUS | Status: DC | PRN

## 2022-02-04 MED ORDER — PENTAFLUOROPROP-TETRAFLUOROETH EX AERO: 1.0000 | INHALATION_SPRAY | CUTANEOUS | Status: DC | PRN

## 2022-02-04 NOTE — ED Provider Notes (Signed)
[facility]ERGENCY DEPARTMENT Provider Note   CSN: [id number] Arrival date & time: 02/03/22  2123     History  Chief Complaint  Patient presents with   Chest Pain    [name] is a 51 y.o. male.  The history is provided by the patient.  Chest Pain He has history of hypertension, diabetes, hyperlipidemia, end-stage renal disease on hemodialysis (Tuesday-Thursday-Saturday) who comes in because of swelling of his legs and scrotum.  He did also have some brief shortness of breath which has resolved.  Denies any chest pain, heaviness, tightness, pressure.  He was supposed to have dialysis yesterday, but missed the session because he had to be at work at 10 AM and would have had to leave dialysis after only 1-2 hours.  He states that he did show up with his dialysis center the day before, but they were not able to do his dialysis at that time.   Home Medications Prior to Admission medications   Medication Sig Start Date End Date Taking? Authorizing Provider  amLODipine (NORVASC) 10 MG tablet Take 1 tablet by mouth once a day 11/20/21     amLODipine (NORVASC) 10 MG tablet Take 1 tablet (10 mg total) by mouth daily. 01/21/22 01/21/23  Ajibola, Rosezella Florida A, NP  amoxicillin-clavulanate (AUGMENTIN) 500-125 MG tablet Take 1 tablet (500 mg total) by mouth daily. 11/08/21   [name], MD  doxycycline (VIBRA-TABS) 100 MG tablet Take 1 tablet (100 mg total) by mouth 2 (two) times daily. 01/19/22   Larene Pickett, PA-C  escitalopram (LEXAPRO) 5 MG tablet Take 1 tablet (5 mg total) by mouth daily. 01/21/22 01/21/23  Ajibola, Ene A, NP  glucose blood test strip Use to check fasting blood sugar once daily. 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 Patient not taking: Reported on 09/28/2021 02/26/21   [name], [name], MD  insulin glargine  (LANTUS) 100 UNIT/ML Solostar Pen Inject 10 Units into the skin daily. May substitute with any other long-acting formulary. Patient not taking: Reported on 09/28/2021 05/13/21   [name], MD  Insulin Pen Needle 32G X 4 MM MISC Use with insulin pens 02/26/21   [name], [name], MD  RENVELA 800 MG tablet Take 800-1,600 mg by mouth See admin instructions. Take 2 tablets (1600 mg) three times a day with meals and 1 tablet (800 mg) twice a day as needed with snacks Patient not taking: Reported on 11/06/2021 10/25/21   [provider]  sevelamer carbonate (RENVELA) 800 MG tablet Take 2 tablet by mouth three times a day with meals and 1 tablet twice daily as needed with snacks 11/20/21     triamcinolone cream (KENALOG) 0.1 % Apply 1 Application topically 2 (two) times daily. 01/06/22   Nyoka Lint, PA-C  gabapentin (NEURONTIN) 300 MG capsule Take 1 capsule (300 mg total) by mouth 3 (three) times daily. Patient not taking: Reported on 07/27/2019 07/29/18 08/30/19  [name], FNP  insulin detemir (LEVEMIR FLEXTOUCH) 100 UNIT/ML FlexPen Inject 35 Units into the skin daily. Patient not taking: Reported on 02/17/2021 11/29/20 02/24/21  [name], MD  sildenafil (VIAGRA) 25 MG tablet Take 1 tablet (25 mg total) by mouth as needed for erectile dysfunction. for erectile dysfunction. Patient not taking: Reported on 08/11/2020 10/13/19 11/27/20  [name], FNP  sitaGLIPtin [facility])  25 MG tablet Take 1 tablet (25 mg total) by mouth daily. Patient not taking: Reported on 08/11/2020 02/22/20 11/27/20  [name], MD      Allergies    Sulfa antibiotics, Keflex [cephalexin], Other, and Bactrim [sulfamethoxazole-trimethoprim]    Review of Systems   Review of Systems  Cardiovascular:  Positive for chest pain.  All other systems reviewed and are negative.   Physical Exam Updated Vital Signs BP (!) 168/101   Pulse 88   Temp 97.9 F (36.6 C) (Oral)   Resp 18   Ht 5\' 7"  (1.702 m)    Wt 72.6 kg   SpO2 96%   BMI 25.07 kg/m  Physical Exam Vitals and nursing note reviewed.   51 year old male, resting comfortably and in no acute distress. Vital signs are significant for elevated blood pressure. Oxygen saturation is 96%, which is normal. Head is normocephalic and atraumatic. PERRLA, EOMI. Oropharynx is clear. Neck is nontender and supple without adenopathy or JVD. Back is nontender and there is no CVA tenderness.  There is no presacral edema. Lungs are clear without rales, wheezes, or rhonchi. Chest is nontender. Heart has regular rate and rhythm with 2/6 systolic ejection murmur best heard along the left sternal border. Abdomen has moderate amount of ascites but is soft and nontender. Genitalia: Circumcised penis with mild to moderate penile and scrotal edema. Extremities have 2-3+ edema, full range of motion is present.  AV fistula is present in the left arm with thrill present. Skin is warm and dry without rash. Neurologic: Mental status is normal, cranial nerves are intact, moves all extremities equally  ED Results / Procedures / Treatments   Labs (all labs ordered are listed, but only abnormal results are displayed) Labs Reviewed  BASIC METABOLIC PANEL - Abnormal; Notable for the following components:      Result Value   CO2 16 (*)    Glucose, Bld 149 (*)    BUN 79 (*)    Creatinine, Ser 11.32 (*)    Calcium 7.5 (*)    GFR, Estimated 5 (*)    Anion gap 19 (*)    All other components within normal limits  CBC - Abnormal; Notable for the following components:   RBC 4.02 (*)    Hemoglobin 12.1 (*)    HCT 37.1 (*)    nRBC 0.7 (*)    All other components within normal limits  TROPONIN I (HIGH SENSITIVITY) - Abnormal; Notable for the following components:   Troponin I (High Sensitivity) 177 (*)    All other components within normal limits  TROPONIN I (HIGH SENSITIVITY) - Abnormal; Notable for the following components:   Troponin I (High Sensitivity) 186  (*)    All other components within normal limits  HEPATITIS B SURFACE ANTIGEN  HEPATITIS B SURFACE ANTIBODY,QUALITATIVE  HEPATITIS B SURFACE ANTIBODY, QUANTITATIVE  HEPATITIS B CORE ANTIBODY, TOTAL  HEPATITIS C ANTIBODY    EKG EKG Interpretation  Date/Time:  Sunday February 03 2022 21:26:36 EDT Ventricular Rate:  89 PR Interval:  146 QRS Duration: 142 QT Interval:  442 QTC Calculation: 537 R Axis:   140 Text Interpretation: Normal sinus rhythm Right atrial enlargement Non-specific intra-ventricular conduction block Possible Anterolateral infarct , age undetermined Abnormal ECG When compared with ECG of 27-Jan-2022 17:28, No significant change was found Confirmed by Delora Fuel ([id number]) on 02/04/2022 12:24:14 AM  Radiology DG Chest 2 View  Result Date: 02/03/2022 CLINICAL DATA:  Chest pain EXAM: CHEST - 2 VIEW COMPARISON:  01/31/2022 FINDINGS: Frontal and lateral views of the chest demonstrates stable enlargement the cardiac silhouette. There is worsening central vascular congestion, with developing interstitial and ground-glass opacities throughout the lungs greatest at the bases. Small left pleural effusion new since prior study. No pneumothorax. IMPRESSION: 1. Worsening volume status, with developing pulmonary edema and small left pleural effusion. Electronically Signed   By: Randa Ngo M.D.   On: 02/03/2022 21:53    Procedures Procedures  Cardiac monitor shows normal sinus rhythm, per my interpretation.  Medications Ordered in ED Medications  Chlorhexidine Gluconate Cloth 2 % PADS 6 each (6 each Topical Patient Refused/Not Given 02/04/22 0629)  pentafluoroprop-tetrafluoroeth (GEBAUERS) aerosol 1 Application (has no administration in time range)  lidocaine (PF) (XYLOCAINE) 1 % injection 5 mL (has no administration in time range)  lidocaine-prilocaine (EMLA) cream 1 Application (has no administration in time range)    ED Course/ Medical Decision Making/ A&P                            Medical Decision Making Amount and/or Complexity of Data Reviewed Labs: ordered. Radiology: ordered.   Peripheral edema secondary to noncompliance with dialysis.  I have reviewed and interpreted his electrocardiogram, and my interpretation is nonspecific intraventricular conduction delay unchanged from prior, no ECG evidence of hyperkalemia.  Chest x-ray shows mild pulmonary edema.  I have independently viewed the images, and agree with the radiologist's interpretation.  Laboratory work-up is pending (CBC, basic metabolic panel, troponin).  If no hyperkalemia, there would be no indication for emergent dialysis.  I have reviewed and interpreted his laboratory tests, and my interpretation is elevated BUN and creatinine consistent with known end-stage renal disease, metabolic acidosis consistent with having missed dialysis, mild anemia which is stable and most likely secondary to end-stage renal disease, elevated troponin which is felt to be secondary to combination of demand ischemia and end-stage renal disease and not felt to represent STEMI.  However, repeat troponin will be necessary.  Of note, troponin has been elevated to similar levels in the past.  Repeat troponin has risen slightly, but the rise is significant.  I have discussed case with Dr. Augustin Coupe of nephrology service who states he will arrange for dialysis.  Case is discussed with Dr. Bridgett Larsson who requests cardiology evaluate the patient to see if he actually needs admission in light of his elevated troponin.  Cardiology consultation is pending at this time.  7:13 AM Still no response to request for cardiology consultation.  I have ordered a repeat troponin and, if it trends down, I feel he can safely just go for his dialysis and be discharged.  However, if it continues to trend upward, he will need to be admitted.  Case signed out to Dr. Ernesto Rutherford  Final Clinical Impression(s) / ED Diagnoses Final diagnoses:  Peripheral edema   End-stage renal disease on hemodialysis ([place])  Elevated troponin  Anemia associated with chronic renal failure    Rx / DC Orders ED Discharge Orders     None         Delora Fuel, MD 00/86/76 0715

## 2022-02-04 NOTE — ED Notes (Signed)
Patient refused labs states he is leaving AMA has an eye doctor appointment at 35 and he is leaving.

## 2022-02-04 NOTE — ED Notes (Signed)
Pt is refusing staff members to obtain BP

## 2022-02-04 NOTE — ED Notes (Signed)
Pt refused this RN to draw blood.

## 2022-02-04 NOTE — ED Notes (Signed)
Pt ambulated to the restroom.

## 2022-02-04 NOTE — ED Notes (Signed)
Pt denies chest pain, shortness of breath at this time. Pt endorses testicle swelling and discomfort. Missed dialysis yesterday due to having to work. Next scheduled treatment is Tuesday.

## 2022-02-04 NOTE — ED Provider Notes (Signed)
Patient signed out to me by Dr. Roxanne Mins at 7510 pending repeat troponin and recommendations by cardiology as well as plan for HD this morning.  In short, this is a 51 year old male with a past medical history of ESRD on TTS HD, hypertension and diabetes presented with shortness of breath after missed dialysis session yesterday.  Upon my evaluation, patient is awake and alert resting in bed comfortably in no acute distress, on room air.  Patient's labs show mildly elevated anion gap and normal potassium chest x-ray with pulmonary edema and pleural effusion.  Patient's troponin is mildly rising from 177-186. The patient has decided to leave against medical advice.  Patient states that he has an eye doctor appointment at 10 AM this morning that he would not like to miss.  He was informed that his work-up is not complete and that her recommendations is that he have dialysis this morning as well as his repeat troponin and cardiology recommendations for if he needs to be admitted to the hospital, however he would like to leave [facility]. The patient had a normal mental status examination and understands his condition and the risks of leaving including worsening volume overload, respiratory distress and respiratory failure, electrolyte abnormality leading to fatal arrhythmia, cardiac dysfunction and death. The patient has had an opportunity to ask  questions about his/her medical condition. The patient has been informed that he may return for care at any time and has been referred to his/her  physician.     Ottie Glazier, DO 02/04/22 [phone]

## 2022-02-04 NOTE — Discharge Instructions (Addendum)
You were seen in the emergency department for shortness of breath.  He did appear to have extra fluid on your lungs likely related to missed dialysis and your troponin was elevated.  We recommended that you have dialysis this morning as well as recommended a cardiology evaluation in the setting of your elevated troponin however you chose to leave [place].  You could have worsening shortness of breath that would lead to difficulty breathing or worsening heart dysfunction that could lead to heart attack and both could put you at risk of death.  You should follow-up with your primary doctor within the next 24 hours and you can return to the emergency department at any time should you change your mind or have any new or worsening symptoms.

## 2022-02-08 ENCOUNTER — Other Ambulatory Visit: Payer: Self-pay

## 2022-02-08 ENCOUNTER — Emergency Department (HOSPITAL_COMMUNITY)
Admission: EM | Admit: 2022-02-08 | Discharge: 2022-02-09 | Disposition: A | Discharge status: Home / Self Care | Payer: Commercial Managed Care - HMO | Attending: Emergency Medicine | Admitting: Emergency Medicine

## 2022-02-08 ENCOUNTER — Encounter (HOSPITAL_COMMUNITY): Payer: Self-pay | Admitting: Emergency Medicine

## 2022-02-08 DIAGNOSIS — R197 Diarrhea, unspecified: Secondary | ICD-10-CM | POA: Insufficient documentation

## 2022-02-08 DIAGNOSIS — R1084 Generalized abdominal pain: Secondary | ICD-10-CM | POA: Insufficient documentation

## 2022-02-08 DIAGNOSIS — R109 Unspecified abdominal pain: Secondary | ICD-10-CM | POA: Diagnosis present

## 2022-02-08 DIAGNOSIS — Z794 Long term (current) use of insulin: Secondary | ICD-10-CM | POA: Insufficient documentation

## 2022-02-08 LAB — CBC
HCT: 37.6 % — ABNORMAL LOW (ref 39.0–52.0)
Hemoglobin: 11.9 g/dL — ABNORMAL LOW (ref 13.0–17.0)
MCH: 29.4 pg (ref 26.0–34.0)
MCHC: 31.6 g/dL (ref 30.0–36.0)
MCV: 92.8 fL (ref 80.0–100.0)
Platelets: 279 10*3/uL (ref 150–400)
RBC: 4.05 MIL/uL — ABNORMAL LOW (ref 4.22–5.81)
RDW: 15.1 % (ref 11.5–15.5)
WBC: 6.4 10*3/uL (ref 4.0–10.5)
nRBC: 0 % (ref 0.0–0.2)

## 2022-02-08 LAB — COMPREHENSIVE METABOLIC PANEL
ALT: 29 U/L (ref 0–44)
AST: 29 U/L (ref 15–41)
Albumin: 2.8 g/dL — ABNORMAL LOW (ref 3.5–5.0)
Alkaline Phosphatase: 269 U/L — ABNORMAL HIGH (ref 38–126)
Anion gap: 12 (ref 5–15)
BUN: 52 mg/dL — ABNORMAL HIGH (ref 6–20)
CO2: 23 mmol/L (ref 22–32)
Calcium: 8.2 mg/dL — ABNORMAL LOW (ref 8.9–10.3)
Chloride: 105 mmol/L (ref 98–111)
Creatinine, Ser: 9.08 mg/dL — ABNORMAL HIGH (ref 0.61–1.24)
GFR, Estimated: 7 mL/min — ABNORMAL LOW (ref >60)
Glucose, Bld: 214 mg/dL — ABNORMAL HIGH (ref 70–99)
Potassium: 3.9 mmol/L (ref 3.5–5.1)
Sodium: 140 mmol/L (ref 135–145)
Total Bilirubin: 0.5 mg/dL (ref 0.3–1.2)
Total Protein: 6.9 g/dL (ref 6.5–8.1)

## 2022-02-08 LAB — LIPASE, BLOOD: Lipase: 26 U/L (ref 11–51)

## 2022-02-08 NOTE — ED Triage Notes (Signed)
Pt is a poor historian, declines to answer questions beyond stating "EMS already told you." Per EMS, also difficult to obtain info. Abd pain with diarrhea for unknown duration.  Pt is alert and oriented x4. Arrived via EMS, homeless.

## 2022-02-09 NOTE — ED Notes (Signed)
Patient is sleeping sat decreased to 80% 02 increased to 4 L/[place]

## 2022-02-09 NOTE — ED Notes (Signed)
Patient was placed on 02 @2  liters

## 2022-02-09 NOTE — ED Notes (Signed)
Pateint called to come back to the treatment room and he refused until he was finished texting.

## 2022-02-09 NOTE — ED Notes (Cosign Needed)
Patient c/o abd. Pain and bilateral leg pain. States he always has pain.

## 2022-02-09 NOTE — ED Provider Notes (Signed)
[facility] EMERGENCY DEPARTMENT Provider Note   CSN: [id number] Arrival date & time: 02/08/22  2123     History  Chief Complaint  Patient presents with   Abdominal Pain    [name] is a 51 y.o. male.  Patient presents to the emergency department stating that he has recently become concerned that he might have colon cancer.  Patient reports that he frequently has abdominal pain and has had loose stools.       Home Medications Prior to Admission medications   Medication Sig Start Date End Date Taking? Authorizing Provider  amLODipine (NORVASC) 10 MG tablet Take 1 tablet by mouth once a day 11/20/21     amLODipine (NORVASC) 10 MG tablet Take 1 tablet (10 mg total) by mouth daily. 01/21/22 01/21/23  Ajibola, Rosezella Florida A, NP  amoxicillin-clavulanate (AUGMENTIN) 500-125 MG tablet Take 1 tablet (500 mg total) by mouth daily. 11/08/21   [name], MD  doxycycline (VIBRA-TABS) 100 MG tablet Take 1 tablet (100 mg total) by mouth 2 (two) times daily. 01/19/22   Larene Pickett, PA-C  escitalopram (LEXAPRO) 5 MG tablet Take 1 tablet (5 mg total) by mouth daily. 01/21/22 01/21/23  Ajibola, Ene A, NP  glucose blood test strip Use to check fasting blood sugar once daily. 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 Patient not taking: Reported on 09/28/2021 02/26/21   [name], [name], MD  insulin glargine (LANTUS) 100 UNIT/ML Solostar Pen Inject 10 Units into the skin daily. May substitute with any other long-acting formulary. Patient not taking: Reported on 09/28/2021 05/13/21   [name], MD  Insulin Pen Needle 32G X 4 MM MISC Use with insulin pens 02/26/21   [name], [name], MD  RENVELA 800 MG tablet Take 800-1,600 mg by mouth See admin instructions. Take 2 tablets (1600 mg) three times a day with meals and 1 tablet (800 mg)  twice a day as needed with snacks Patient not taking: Reported on 11/06/2021 10/25/21   [provider]  sevelamer carbonate (RENVELA) 800 MG tablet Take 2 tablet by mouth three times a day with meals and 1 tablet twice daily as needed with snacks 11/20/21     triamcinolone cream (KENALOG) 0.1 % Apply 1 Application topically 2 (two) times daily. 01/06/22   Nyoka Lint, PA-C  gabapentin (NEURONTIN) 300 MG capsule Take 1 capsule (300 mg total) by mouth 3 (three) times daily. Patient not taking: Reported on 07/27/2019 07/29/18 08/30/19  [name], FNP  insulin detemir (LEVEMIR FLEXTOUCH) 100 UNIT/ML FlexPen Inject 35 Units into the skin daily. Patient not taking: Reported on 02/17/2021 11/29/20 02/24/21  [name], MD  sildenafil (VIAGRA) 25 MG tablet Take 1 tablet (25 mg total) by mouth as needed for erectile dysfunction. for erectile dysfunction. Patient not taking: Reported on 08/11/2020 10/13/19 11/27/20  [name], FNP  sitaGLIPtin (JANUVIA) 25 MG tablet Take 1 tablet (25 mg total) by mouth daily. Patient not taking: Reported on 08/11/2020 02/22/20 11/27/20  [name], MD      Allergies    Sulfa antibiotics, Keflex [cephalexin], Other, and Bactrim [sulfamethoxazole-trimethoprim]    Review of Systems   Review of Systems  Physical Exam Updated Vital Signs BP (!) 146/97 (BP Location: Left Arm)   Pulse 85   Temp 99.1 F (37.3 C) (Oral)  Resp 18   Wt 72 kg   SpO2 99%   BMI 24.86 kg/m  Physical Exam Vitals and nursing note reviewed.  Constitutional:      General: He is not in acute distress.    Appearance: He is well-developed.  HENT:     Head: Normocephalic and atraumatic.     Mouth/Throat:     Mouth: Mucous membranes are moist.  Eyes:     General: Vision grossly intact. Gaze aligned appropriately.     Extraocular Movements: Extraocular movements intact.     Conjunctiva/sclera: Conjunctivae normal.  Cardiovascular:     Rate and Rhythm: Normal rate  and regular rhythm.     Pulses: Normal pulses.     Heart sounds: Normal heart sounds, S1 normal and S2 normal. No murmur heard.    No friction rub. No gallop.  Pulmonary:     Effort: Pulmonary effort is normal. No respiratory distress.     Breath sounds: Normal breath sounds.  Abdominal:     Palpations: Abdomen is soft.     Tenderness: There is no abdominal tenderness. There is no guarding or rebound.     Hernia: No hernia is present.  Musculoskeletal:        General: No swelling.     Cervical back: Full passive range of motion without pain, normal range of motion and neck supple. No pain with movement, spinous process tenderness or muscular tenderness. Normal range of motion.     Right lower leg: No edema.     Left lower leg: No edema.  Skin:    General: Skin is warm and dry.     Capillary Refill: Capillary refill takes less than 2 seconds.     Findings: No ecchymosis, erythema, lesion or wound.  Neurological:     Mental Status: He is alert and oriented to person, place, and time.     GCS: GCS eye subscore is 4. GCS verbal subscore is 5. GCS motor subscore is 6.     Cranial Nerves: Cranial nerves 2-12 are intact.     Sensory: Sensation is intact.     Motor: Motor function is intact. No weakness or abnormal muscle tone.     Coordination: Coordination is intact.  Psychiatric:        Mood and Affect: Mood normal.        Speech: Speech normal.        Behavior: Behavior normal.     ED Results / Procedures / Treatments   Labs (all labs ordered are listed, but only abnormal results are displayed) Labs Reviewed  COMPREHENSIVE METABOLIC PANEL - Abnormal; Notable for the following components:      Result Value   Glucose, Bld 214 (*)    BUN 52 (*)    Creatinine, Ser 9.08 (*)    Calcium 8.2 (*)    Albumin 2.8 (*)    Alkaline Phosphatase 269 (*)    GFR, Estimated 7 (*)    All other components within normal limits  CBC - Abnormal; Notable for the following components:   RBC 4.05  (*)    Hemoglobin 11.9 (*)    HCT 37.6 (*)    All other components within normal limits  LIPASE, BLOOD    EKG None  Radiology No results found.  Procedures Procedures    Medications Ordered in ED Medications - No data to display  ED Course/ Medical Decision Making/ A&P  Medical Decision Making Amount and/or Complexity of Data Reviewed Labs: ordered.   Patient presents to the emergency department stating that he has frequent abdominal pain.  Reviewing his records reveals he has been in the emergency department nearly every day recently.  Patient has previously complained of this abdominal pain.  Patient did have a CT scan 3 months ago that did not show any intra-abdominal masses or other abnormal abdominal findings.  Abdominal exam is benign today.  He has normal labs, no leukocytosis.  I do not see any indication for repeat CT scan or other imaging currently.  Patient reassured.        Final Clinical Impression(s) / ED Diagnoses Final diagnoses:  Generalized abdominal pain    Rx / DC Orders ED Discharge Orders     None         Shamar Engelmann, Gwenyth Allegra, MD 02/09/22 [phone]

## 2022-02-09 NOTE — ED Notes (Signed)
Having to constantly replace o2 patient is taking it off.

## 2022-02-11 ENCOUNTER — Ambulatory Visit: Payer: Medicaid Other | Admitting: Podiatry

## 2022-02-12 ENCOUNTER — Encounter (HOSPITAL_COMMUNITY): Payer: Self-pay | Admitting: Emergency Medicine

## 2022-02-12 ENCOUNTER — Other Ambulatory Visit: Payer: Self-pay

## 2022-02-12 ENCOUNTER — Emergency Department (HOSPITAL_COMMUNITY): Payer: Commercial Managed Care - HMO

## 2022-02-12 ENCOUNTER — Emergency Department (HOSPITAL_COMMUNITY)
Admission: EM | Admit: 2022-02-12 | Discharge: 2022-02-13 | Discharge status: Against Medical Advice | Payer: Commercial Managed Care - HMO | Attending: Emergency Medicine | Admitting: Emergency Medicine

## 2022-02-12 DIAGNOSIS — R079 Chest pain, unspecified: Secondary | ICD-10-CM | POA: Diagnosis not present

## 2022-02-12 DIAGNOSIS — Z992 Dependence on renal dialysis: Secondary | ICD-10-CM | POA: Diagnosis not present

## 2022-02-12 DIAGNOSIS — Z5321 Procedure and treatment not carried out due to patient leaving prior to being seen by health care provider: Secondary | ICD-10-CM | POA: Diagnosis not present

## 2022-02-12 NOTE — ED Triage Notes (Signed)
Patient from the street, comes in with chest pain.  No nausea or vomiting, no shortness of breath.  Patient had dialysis yesterday and today.

## 2022-02-12 NOTE — ED Provider Triage Note (Signed)
Emergency Medicine Provider Triage Evaluation Note  [name] , a 51 y.o. male  was evaluated in triage.  Pt complains of chest pain.  Denies SOB.  Had dialysis treatment yesterday and today.  Review of Systems  Positive: Chest pain Negative: SOB, fever  Physical Exam  BP (!) 179/96 (BP Location: Left Arm)   Pulse 87   Temp 98.6 F (37 C) (Oral)   Resp 16   SpO2 91%  Gen:   Awake, no distress   Resp:  Normal effort  MSK:   Moves extremities without difficulty  Other:    Medical Decision Making  Medically screening exam initiated at 11:42 PM.  Appropriate orders placed.  ETTORE TREBILCOCK was informed that the remainder of the evaluation will be completed by another provider, this initial triage assessment does not replace that evaluation, and the importance of remaining in the ED until their evaluation is complete.  Chest pain.  Had full HD yesterday and today.  EKG, labs, CXR.   Larene Pickett, PA-C 02/12/22 2344

## 2022-02-13 LAB — BASIC METABOLIC PANEL
Anion gap: 12 (ref 5–15)
BUN: 36 mg/dL — ABNORMAL HIGH (ref 6–20)
CO2: 27 mmol/L (ref 22–32)
Calcium: 7.6 mg/dL — ABNORMAL LOW (ref 8.9–10.3)
Chloride: 100 mmol/L (ref 98–111)
Creatinine, Ser: 6.7 mg/dL — ABNORMAL HIGH (ref 0.61–1.24)
GFR, Estimated: 9 mL/min — ABNORMAL LOW (ref >60)
Glucose, Bld: 207 mg/dL — ABNORMAL HIGH (ref 70–99)
Potassium: 3.7 mmol/L (ref 3.5–5.1)
Sodium: 139 mmol/L (ref 135–145)

## 2022-02-13 LAB — CBC WITH DIFFERENTIAL/PLATELET
Abs Immature Granulocytes: 0.03 10*3/uL (ref 0.00–0.07)
Basophils Absolute: 0 10*3/uL (ref 0.0–0.1)
Basophils Relative: 1 %
Eosinophils Absolute: 0.3 10*3/uL (ref 0.0–0.5)
Eosinophils Relative: 5 %
HCT: 40.1 % (ref 39.0–52.0)
Hemoglobin: 12.4 g/dL — ABNORMAL LOW (ref 13.0–17.0)
Immature Granulocytes: 1 %
Lymphocytes Relative: 16 %
Lymphs Abs: 0.9 10*3/uL (ref 0.7–4.0)
MCH: 29.6 pg (ref 26.0–34.0)
MCHC: 30.9 g/dL (ref 30.0–36.0)
MCV: 95.7 fL (ref 80.0–100.0)
Monocytes Absolute: 0.7 10*3/uL (ref 0.1–1.0)
Monocytes Relative: 13 %
Neutro Abs: 3.6 10*3/uL (ref 1.7–7.7)
Neutrophils Relative %: 64 %
Platelets: 284 10*3/uL (ref 150–400)
RBC: 4.19 MIL/uL — ABNORMAL LOW (ref 4.22–5.81)
RDW: 15.1 % (ref 11.5–15.5)
WBC: 5.5 10*3/uL (ref 4.0–10.5)
nRBC: 0 % (ref 0.0–0.2)

## 2022-02-13 LAB — TROPONIN I (HIGH SENSITIVITY): Troponin I (High Sensitivity): 205 ng/L (ref <18)

## 2022-02-13 NOTE — ED Notes (Signed)
Patient is exposing himself and pleasuring himself in the lobby in front of patients and visitors.  Patient was given the option to stop or he can leave from the department.

## 2022-02-13 NOTE — ED Notes (Addendum)
Pt refused to go to room. Pt does not want to be seen. Pt said he needs to catch the bus

## 2022-02-19 ENCOUNTER — Encounter (HOSPITAL_COMMUNITY): Payer: Self-pay | Admitting: Emergency Medicine

## 2022-02-19 ENCOUNTER — Other Ambulatory Visit: Payer: Self-pay

## 2022-02-19 ENCOUNTER — Emergency Department (HOSPITAL_COMMUNITY)
Admission: EM | Admit: 2022-02-19 | Discharge: 2022-02-20 | Discharge status: Against Medical Advice | Payer: Commercial Managed Care - HMO | Attending: Student | Admitting: Student

## 2022-02-19 DIAGNOSIS — Z5321 Procedure and treatment not carried out due to patient leaving prior to being seen by health care provider: Secondary | ICD-10-CM | POA: Diagnosis not present

## 2022-02-19 DIAGNOSIS — R1010 Upper abdominal pain, unspecified: Secondary | ICD-10-CM | POA: Diagnosis present

## 2022-02-19 LAB — COMPREHENSIVE METABOLIC PANEL
ALT: 29 U/L (ref 0–44)
AST: 31 U/L (ref 15–41)
Albumin: 2.7 g/dL — ABNORMAL LOW (ref 3.5–5.0)
Alkaline Phosphatase: 231 U/L — ABNORMAL HIGH (ref 38–126)
Anion gap: 14 (ref 5–15)
BUN: 75 mg/dL — ABNORMAL HIGH (ref 6–20)
CO2: 20 mmol/L — ABNORMAL LOW (ref 22–32)
Calcium: 7.3 mg/dL — ABNORMAL LOW (ref 8.9–10.3)
Chloride: 103 mmol/L (ref 98–111)
Creatinine, Ser: 10.81 mg/dL — ABNORMAL HIGH (ref 0.61–1.24)
GFR, Estimated: 5 mL/min — ABNORMAL LOW (ref >60)
Glucose, Bld: 171 mg/dL — ABNORMAL HIGH (ref 70–99)
Potassium: 4.3 mmol/L (ref 3.5–5.1)
Sodium: 137 mmol/L (ref 135–145)
Total Bilirubin: 0.4 mg/dL (ref 0.3–1.2)
Total Protein: 6.4 g/dL — ABNORMAL LOW (ref 6.5–8.1)

## 2022-02-19 LAB — CBC WITH DIFFERENTIAL/PLATELET
Abs Immature Granulocytes: 0.01 10*3/uL (ref 0.00–0.07)
Basophils Absolute: 0 10*3/uL (ref 0.0–0.1)
Basophils Relative: 1 %
Eosinophils Absolute: 0.2 10*3/uL (ref 0.0–0.5)
Eosinophils Relative: 4 %
HCT: 37.5 % — ABNORMAL LOW (ref 39.0–52.0)
Hemoglobin: 11.4 g/dL — ABNORMAL LOW (ref 13.0–17.0)
Immature Granulocytes: 0 %
Lymphocytes Relative: 17 %
Lymphs Abs: 1 10*3/uL (ref 0.7–4.0)
MCH: 28.6 pg (ref 26.0–34.0)
MCHC: 30.4 g/dL (ref 30.0–36.0)
MCV: 94 fL (ref 80.0–100.0)
Monocytes Absolute: 0.7 10*3/uL (ref 0.1–1.0)
Monocytes Relative: 13 %
Neutro Abs: 3.7 10*3/uL (ref 1.7–7.7)
Neutrophils Relative %: 65 %
Platelets: 284 10*3/uL (ref 150–400)
RBC: 3.99 MIL/uL — ABNORMAL LOW (ref 4.22–5.81)
RDW: 14.3 % (ref 11.5–15.5)
WBC: 5.7 10*3/uL (ref 4.0–10.5)
nRBC: 0 % (ref 0.0–0.2)

## 2022-02-19 LAB — LIPASE, BLOOD: Lipase: 33 U/L (ref 11–51)

## 2022-02-19 NOTE — ED Provider Triage Note (Signed)
Emergency Medicine Provider Triage Evaluation Note  [name] , a 51 y.o. male  was evaluated in triage.  Pt complains of upper abdominal pain for the last few days.  Feels like gas, denies nausea or vomiting.  Denies chest pain.  Last dialysis Thursday..  Review of Systems  Per HPI  Physical Exam  BP (!) 174/88 (BP Location: Left Arm)   Pulse 85   Temp 99.1 F (37.3 C) (Oral)   Resp 18   SpO2 96%  Gen:   Awake, no distress   Resp:  Normal effort  MSK:   Moves extremities without difficulty  Other:  Mild epigastric tenderness.  Right upper extremity with palpable thrill AV fistula  Medical Decision Making  Medically screening exam initiated at 8:26 PM.  Appropriate orders placed.  [name] was informed that the remainder of the evaluation will be completed by another provider, this initial triage assessment does not replace that evaluation, and the importance of remaining in the ED until their evaluation is complete.     Douglas Raring, PA-C 02/19/22 2027

## 2022-02-19 NOTE — ED Triage Notes (Signed)
Patient reports mid abdominal pain with pressure and "gassy" onset this week . No emesis /occasional loose stools.

## 2022-02-20 ENCOUNTER — Encounter (HOSPITAL_COMMUNITY): Payer: Self-pay | Admitting: Emergency Medicine

## 2022-02-20 ENCOUNTER — Other Ambulatory Visit: Payer: Self-pay

## 2022-02-20 ENCOUNTER — Emergency Department (HOSPITAL_COMMUNITY)
Admission: EM | Admit: 2022-02-20 | Discharge: 2022-02-21 | Disposition: A | Discharge status: Home / Self Care | Payer: Commercial Managed Care - HMO | Attending: Student | Admitting: Student

## 2022-02-20 DIAGNOSIS — Z5321 Procedure and treatment not carried out due to patient leaving prior to being seen by health care provider: Secondary | ICD-10-CM | POA: Insufficient documentation

## 2022-02-20 DIAGNOSIS — R1084 Generalized abdominal pain: Secondary | ICD-10-CM | POA: Insufficient documentation

## 2022-02-20 NOTE — ED Provider Triage Note (Incomplete)
Emergency Medicine Provider Triage Evaluation Note  [name] , a 51 y.o. male  was evaluated in triage.  Pt complains of ***.  Review of Systems  Positive: *** Negative: ***  Physical Exam  There were no vitals taken for this visit. Gen:   Awake, no distress  *** Resp:  Normal effort *** MSK:   Moves extremities without difficulty *** Other:  ***  Medical Decision Making  Medically screening exam initiated at 11:57 PM.  Appropriate orders placed.  [name] was informed that the remainder of the evaluation will be completed by another provider, this initial triage assessment does not replace that evaluation, and the importance of remaining in the ED until their evaluation is complete.  ***

## 2022-02-20 NOTE — ED Notes (Signed)
Patient left ED.  

## 2022-02-20 NOTE — ED Triage Notes (Signed)
Patient missed 2 hemodialysis treatment this week reports generalized abdominal pain today .

## 2022-02-20 NOTE — ED Provider Triage Note (Signed)
Emergency Medicine Provider Triage Evaluation Note  CHARLTON BOULE , a 51 y.o. male  was evaluated in triage.  Pt well known to this department for his frequent ED visits.  States that he has missed 2 dialysis sessions, requesting dialysis in the ER tonight as she has been rescheduled for tomorrow but does not think he will make his dialysis session due to disability court scheduled near the same time.  Only complaint at this time is his baseline generalized abdominal pain, unchanged from prior.  Patient has been evaluated in the ED multiple times for this.  Does endorse some increased swelling in the legs from baseline.  Review of Systems  Positive: As above Negative: Chest pain, syncope, nausea vomiting diarrhea, chills  Physical Exam  There were no vitals taken for this visit. Gen:   Awake, no distress   Resp:  Normal effort  MSK:   Moves extremities without difficulty  Other:  Abdomen soft, nondistended, nontender. RRR, lungs CTAB  Medical Decision Making  Medically screening exam initiated at 11:57 PM.  Appropriate orders placed.  NEFTALI ABAIR was informed that the remainder of the evaluation will be completed by another provider, this initial triage assessment does not replace that evaluation, and the importance of remaining in the ED until their evaluation is complete.  This chart was dictated using voice recognition software, Dragon. Despite the best efforts of this provider to proofread and correct errors, errors may still occur which can change documentation meaning.    Emeline Darling, PA-C 02/21/22 0006

## 2022-02-21 ENCOUNTER — Emergency Department (HOSPITAL_COMMUNITY)
Admission: EM | Admit: 2022-02-21 | Discharge: 2022-02-22 | Discharge status: Against Medical Advice | Payer: Commercial Managed Care - HMO | Source: Home / Self Care

## 2022-02-21 DIAGNOSIS — R109 Unspecified abdominal pain: Secondary | ICD-10-CM | POA: Insufficient documentation

## 2022-02-21 DIAGNOSIS — R1084 Generalized abdominal pain: Secondary | ICD-10-CM | POA: Diagnosis not present

## 2022-02-21 DIAGNOSIS — Z5321 Procedure and treatment not carried out due to patient leaving prior to being seen by health care provider: Secondary | ICD-10-CM | POA: Insufficient documentation

## 2022-02-21 LAB — COMPREHENSIVE METABOLIC PANEL
ALT: 29 U/L (ref 0–44)
AST: 26 U/L (ref 15–41)
Albumin: 2.8 g/dL — ABNORMAL LOW (ref 3.5–5.0)
Alkaline Phosphatase: 218 U/L — ABNORMAL HIGH (ref 38–126)
Anion gap: 15 (ref 5–15)
BUN: 89 mg/dL — ABNORMAL HIGH (ref 6–20)
CO2: 18 mmol/L — ABNORMAL LOW (ref 22–32)
Calcium: 7.4 mg/dL — ABNORMAL LOW (ref 8.9–10.3)
Chloride: 103 mmol/L (ref 98–111)
Creatinine, Ser: 12 mg/dL — ABNORMAL HIGH (ref 0.61–1.24)
GFR, Estimated: 5 mL/min — ABNORMAL LOW (ref >60)
Glucose, Bld: 218 mg/dL — ABNORMAL HIGH (ref 70–99)
Potassium: 4.5 mmol/L (ref 3.5–5.1)
Sodium: 136 mmol/L (ref 135–145)
Total Bilirubin: 0.6 mg/dL (ref 0.3–1.2)
Total Protein: 6.7 g/dL (ref 6.5–8.1)

## 2022-02-21 LAB — CBC WITH DIFFERENTIAL/PLATELET
Abs Immature Granulocytes: 0.03 10*3/uL (ref 0.00–0.07)
Basophils Absolute: 0 10*3/uL (ref 0.0–0.1)
Basophils Relative: 1 %
Eosinophils Absolute: 0.1 10*3/uL (ref 0.0–0.5)
Eosinophils Relative: 2 %
HCT: 37.2 % — ABNORMAL LOW (ref 39.0–52.0)
Hemoglobin: 11.4 g/dL — ABNORMAL LOW (ref 13.0–17.0)
Immature Granulocytes: 1 %
Lymphocytes Relative: 15 %
Lymphs Abs: 0.9 10*3/uL (ref 0.7–4.0)
MCH: 28.6 pg (ref 26.0–34.0)
MCHC: 30.6 g/dL (ref 30.0–36.0)
MCV: 93.5 fL (ref 80.0–100.0)
Monocytes Absolute: 0.7 10*3/uL (ref 0.1–1.0)
Monocytes Relative: 12 %
Neutro Abs: 4.2 10*3/uL (ref 1.7–7.7)
Neutrophils Relative %: 69 %
Platelets: 309 10*3/uL (ref 150–400)
RBC: 3.98 MIL/uL — ABNORMAL LOW (ref 4.22–5.81)
RDW: 14.4 % (ref 11.5–15.5)
WBC: 6 10*3/uL (ref 4.0–10.5)
nRBC: 0 % (ref 0.0–0.2)

## 2022-02-21 NOTE — ED Notes (Signed)
PT refused to have

## 2022-02-21 NOTE — ED Notes (Signed)
PT refused the vitals

## 2022-02-21 NOTE — ED Notes (Signed)
Pt called for triage, pt refusing to come to triage.

## 2022-02-21 NOTE — ED Notes (Signed)
Refused Vital Signs, to be retaken.

## 2022-02-21 NOTE — ED Provider Notes (Signed)
  Attempted PIT evaluation, however patient refusing to come to triage for assessment/vitals.   Larene Pickett, PA-C 02/21/22 2305    Drenda Freeze, MD 02/22/22 [phone]

## 2022-02-21 NOTE — ED Notes (Signed)
RN unable to triage as pt refused to come to triage.

## 2022-02-21 NOTE — ED Triage Notes (Signed)
Per EMS, pt c/o Abdominal pain X4 days, denies n/v/d.  Pt had dialysis today.    190/82 [id number]

## 2022-02-21 NOTE — ED Notes (Signed)
No answer when called for re-eval.

## 2022-02-22 ENCOUNTER — Ambulatory Visit: Payer: Medicaid Other | Admitting: Podiatrist

## 2022-02-22 ENCOUNTER — Encounter (HOSPITAL_COMMUNITY): Payer: Self-pay | Admitting: Emergency Medicine

## 2022-02-22 ENCOUNTER — Other Ambulatory Visit: Payer: Self-pay

## 2022-02-22 NOTE — ED Triage Notes (Signed)
Pt found in the waiting room watching his movie on his phone.  Pt told that if he does not go to triage than he needed to leave.  Pt came back to triage. He did not want to speak to RN.  He finally stated "my stomach hurts."  Michela Pitcher that he ended his dialysis yesterday 20 minutes early.

## 2022-02-22 NOTE — ED Notes (Signed)
Pt stated that he had to go to dialysis. Pt was seen leaving the ED.

## 2022-02-24 ENCOUNTER — Emergency Department (HOSPITAL_COMMUNITY)
Admission: EM | Admit: 2022-02-24 | Discharge: 2022-02-25 | Discharge status: Against Medical Advice | Payer: Commercial Managed Care - HMO | Attending: Student | Admitting: Student

## 2022-02-24 ENCOUNTER — Other Ambulatory Visit: Payer: Self-pay

## 2022-02-24 DIAGNOSIS — Z992 Dependence on renal dialysis: Secondary | ICD-10-CM | POA: Diagnosis not present

## 2022-02-24 DIAGNOSIS — R110 Nausea: Secondary | ICD-10-CM | POA: Insufficient documentation

## 2022-02-24 DIAGNOSIS — R1013 Epigastric pain: Secondary | ICD-10-CM | POA: Insufficient documentation

## 2022-02-24 DIAGNOSIS — Z5321 Procedure and treatment not carried out due to patient leaving prior to being seen by health care provider: Secondary | ICD-10-CM | POA: Diagnosis not present

## 2022-02-24 DIAGNOSIS — R0789 Other chest pain: Secondary | ICD-10-CM | POA: Diagnosis not present

## 2022-02-24 DIAGNOSIS — R300 Dysuria: Secondary | ICD-10-CM | POA: Insufficient documentation

## 2022-02-24 LAB — CBC WITH DIFFERENTIAL/PLATELET
Abs Immature Granulocytes: 0.03 10*3/uL (ref 0.00–0.07)
Basophils Absolute: 0 10*3/uL (ref 0.0–0.1)
Basophils Relative: 1 %
Eosinophils Absolute: 0.2 10*3/uL (ref 0.0–0.5)
Eosinophils Relative: 2 %
HCT: 38.1 % — ABNORMAL LOW (ref 39.0–52.0)
Hemoglobin: 12.1 g/dL — ABNORMAL LOW (ref 13.0–17.0)
Immature Granulocytes: 0 %
Lymphocytes Relative: 15 %
Lymphs Abs: 1 10*3/uL (ref 0.7–4.0)
MCH: 28.8 pg (ref 26.0–34.0)
MCHC: 31.8 g/dL (ref 30.0–36.0)
MCV: 90.7 fL (ref 80.0–100.0)
Monocytes Absolute: 0.7 10*3/uL (ref 0.1–1.0)
Monocytes Relative: 11 %
Neutro Abs: 5 10*3/uL (ref 1.7–7.7)
Neutrophils Relative %: 71 %
Platelets: 343 10*3/uL (ref 150–400)
RBC: 4.2 MIL/uL — ABNORMAL LOW (ref 4.22–5.81)
RDW: 14.2 % (ref 11.5–15.5)
WBC: 7 10*3/uL (ref 4.0–10.5)
nRBC: 0 % (ref 0.0–0.2)

## 2022-02-24 NOTE — ED Triage Notes (Signed)
Pt here for abd pain, states he missed his dialysis on saturday

## 2022-02-24 NOTE — ED Provider Triage Note (Signed)
Emergency Medicine Provider Triage Evaluation Note  [name] , a 51 y.o. male  was evaluated in triage.  Pt complains of epigastric abdominal pain x4 to 5 days.  Associated with nausea but no vomiting.  Last dialysis Thursday..  Review of Systems  Per HPI  Physical Exam  There were no vitals taken for this visit. Gen:   Awake, no distress   Resp:  Normal effort  MSK:   Moves extremities without difficulty  Other:  Epigastric tenderness, AV fistula palpable thrill to right upper extremity.  Medical Decision Making  Medically screening exam initiated at 11:16 PM.  Appropriate orders placed.  [name] was informed that the remainder of the evaluation will be completed by another provider, this initial triage assessment does not replace that evaluation, and the importance of remaining in the ED until their evaluation is complete.     Sherrill Raring, PA-C 02/24/22 2317

## 2022-02-25 DIAGNOSIS — R0789 Other chest pain: Secondary | ICD-10-CM | POA: Insufficient documentation

## 2022-02-25 DIAGNOSIS — R1013 Epigastric pain: Secondary | ICD-10-CM | POA: Diagnosis not present

## 2022-02-25 DIAGNOSIS — Z5321 Procedure and treatment not carried out due to patient leaving prior to being seen by health care provider: Secondary | ICD-10-CM | POA: Insufficient documentation

## 2022-02-25 DIAGNOSIS — R109 Unspecified abdominal pain: Secondary | ICD-10-CM | POA: Insufficient documentation

## 2022-02-25 DIAGNOSIS — R300 Dysuria: Secondary | ICD-10-CM | POA: Insufficient documentation

## 2022-02-25 LAB — COMPREHENSIVE METABOLIC PANEL
ALT: 29 U/L (ref 0–44)
AST: 27 U/L (ref 15–41)
Albumin: 2.9 g/dL — ABNORMAL LOW (ref 3.5–5.0)
Alkaline Phosphatase: 245 U/L — ABNORMAL HIGH (ref 38–126)
Anion gap: 14 (ref 5–15)
BUN: 79 mg/dL — ABNORMAL HIGH (ref 6–20)
CO2: 19 mmol/L — ABNORMAL LOW (ref 22–32)
Calcium: 8 mg/dL — ABNORMAL LOW (ref 8.9–10.3)
Chloride: 102 mmol/L (ref 98–111)
Creatinine, Ser: 11.66 mg/dL — ABNORMAL HIGH (ref 0.61–1.24)
GFR, Estimated: 5 mL/min — ABNORMAL LOW (ref >60)
Glucose, Bld: 173 mg/dL — ABNORMAL HIGH (ref 70–99)
Potassium: 4.7 mmol/L (ref 3.5–5.1)
Sodium: 135 mmol/L (ref 135–145)
Total Bilirubin: 0.6 mg/dL (ref 0.3–1.2)
Total Protein: 7.1 g/dL (ref 6.5–8.1)

## 2022-02-25 LAB — LIPASE, BLOOD: Lipase: 29 U/L (ref 11–51)

## 2022-02-25 NOTE — ED Notes (Signed)
Pt refused vitals 

## 2022-02-25 NOTE — ED Notes (Signed)
PATIENT LEFT AMA 

## 2022-02-25 NOTE — ED Triage Notes (Signed)
Pt arrives via [facility]. Per report. Pt is c/o pain: 8/10 chest wall pain, abdominal pain, missed dialysis in Saturday, shortened on Thursday. 192/102, 92, 97% ra, cbg 189. Rr 16. Lung sound are clear.

## 2022-02-26 ENCOUNTER — Other Ambulatory Visit: Payer: Self-pay

## 2022-02-26 ENCOUNTER — Emergency Department (HOSPITAL_COMMUNITY): Payer: Commercial Managed Care - HMO

## 2022-02-26 ENCOUNTER — Emergency Department (HOSPITAL_COMMUNITY)
Admission: EM | Admit: 2022-02-26 | Discharge: 2022-02-26 | Discharge status: Against Medical Advice | Payer: Commercial Managed Care - HMO | Source: Home / Self Care

## 2022-02-26 ENCOUNTER — Encounter (HOSPITAL_COMMUNITY): Payer: Self-pay | Admitting: *Deleted

## 2022-02-26 ENCOUNTER — Emergency Department (HOSPITAL_COMMUNITY)
Admission: EM | Admit: 2022-02-26 | Discharge: 2022-02-27 | Discharge status: Against Medical Advice | Payer: Commercial Managed Care - HMO | Attending: Emergency Medicine | Admitting: Emergency Medicine

## 2022-02-26 DIAGNOSIS — Z5321 Procedure and treatment not carried out due to patient leaving prior to being seen by health care provider: Secondary | ICD-10-CM | POA: Diagnosis not present

## 2022-02-26 DIAGNOSIS — R300 Dysuria: Secondary | ICD-10-CM | POA: Diagnosis present

## 2022-02-26 DIAGNOSIS — R1013 Epigastric pain: Secondary | ICD-10-CM | POA: Diagnosis not present

## 2022-02-26 LAB — CBC WITH DIFFERENTIAL/PLATELET
Abs Immature Granulocytes: 0.03 10*3/uL (ref 0.00–0.07)
Basophils Absolute: 0.1 10*3/uL (ref 0.0–0.1)
Basophils Relative: 1 %
Eosinophils Absolute: 0.2 10*3/uL (ref 0.0–0.5)
Eosinophils Relative: 3 %
HCT: 37.5 % — ABNORMAL LOW (ref 39.0–52.0)
Hemoglobin: 12 g/dL — ABNORMAL LOW (ref 13.0–17.0)
Immature Granulocytes: 1 %
Lymphocytes Relative: 14 %
Lymphs Abs: 0.9 10*3/uL (ref 0.7–4.0)
MCH: 29.3 pg (ref 26.0–34.0)
MCHC: 32 g/dL (ref 30.0–36.0)
MCV: 91.7 fL (ref 80.0–100.0)
Monocytes Absolute: 0.6 10*3/uL (ref 0.1–1.0)
Monocytes Relative: 9 %
Neutro Abs: 4.8 10*3/uL (ref 1.7–7.7)
Neutrophils Relative %: 72 %
Platelets: 360 10*3/uL (ref 150–400)
RBC: 4.09 MIL/uL — ABNORMAL LOW (ref 4.22–5.81)
RDW: 14.4 % (ref 11.5–15.5)
WBC: 6.5 10*3/uL (ref 4.0–10.5)
nRBC: 0 % (ref 0.0–0.2)

## 2022-02-26 LAB — COMPREHENSIVE METABOLIC PANEL
ALT: 26 U/L (ref 0–44)
AST: 22 U/L (ref 15–41)
Albumin: 2.9 g/dL — ABNORMAL LOW (ref 3.5–5.0)
Alkaline Phosphatase: 255 U/L — ABNORMAL HIGH (ref 38–126)
Anion gap: 17 — ABNORMAL HIGH (ref 5–15)
BUN: 87 mg/dL — ABNORMAL HIGH (ref 6–20)
CO2: 18 mmol/L — ABNORMAL LOW (ref 22–32)
Calcium: 7.9 mg/dL — ABNORMAL LOW (ref 8.9–10.3)
Chloride: 102 mmol/L (ref 98–111)
Creatinine, Ser: 12.47 mg/dL — ABNORMAL HIGH (ref 0.61–1.24)
GFR, Estimated: 4 mL/min — ABNORMAL LOW (ref >60)
Glucose, Bld: 157 mg/dL — ABNORMAL HIGH (ref 70–99)
Potassium: 4.4 mmol/L (ref 3.5–5.1)
Sodium: 137 mmol/L (ref 135–145)
Total Bilirubin: 0.6 mg/dL (ref 0.3–1.2)
Total Protein: 7.1 g/dL (ref 6.5–8.1)

## 2022-02-26 LAB — MAGNESIUM: Magnesium: 2.4 mg/dL (ref 1.7–2.4)

## 2022-02-26 NOTE — ED Notes (Signed)
Called ptx4 for vitals, no answer

## 2022-02-26 NOTE — ED Triage Notes (Signed)
Pt states that even though he does not urinate much, he is having urinary burning. Also c/o chest pain

## 2022-02-26 NOTE — ED Triage Notes (Signed)
Pt reports does not urinate much, but when he does it hurts, he checked in last night but had to leave d/t dialysis appointment, however, he ended up missing. His last Dialysis was Thursday.

## 2022-02-26 NOTE — ED Notes (Signed)
Pt has been called several times in lobby by tech, no answer

## 2022-02-26 NOTE — ED Provider Triage Note (Signed)
Emergency Medicine Provider Triage Evaluation Note  [name] , a 51 y.o. male  was evaluated in triage.  Pt complains of chest pain, dysuria, going on for today, last dialysis was Thursday, missed Saturday, supposed to go dialysis today.  States he has a burning sensation when he urinates, does not urinate very much, shortness of breath denies worsening peripheral edema..  Review of Systems  Positive: Chest pain, dysuria Negative: Some pain nausea vomiting  Physical Exam  BP (!) 162/92 (BP Location: Right Arm)   Pulse 78   Temp 98.2 F (36.8 C) (Oral)   Resp 18   SpO2 98%  Gen:   Awake, no distress   Resp:  Normal effort  MSK:   Moves extremities without difficulty  Other:    Medical Decision Making  Medically screening exam initiated at 2:33 AM.  Appropriate orders placed.  [name] was informed that the remainder of the evaluation will be completed by another provider, this initial triage assessment does not replace that evaluation, and the importance of remaining in the ED until their evaluation is complete.  Lab work imaging been ordered will need further work-up.   Douglas Fennel, PA-C 02/26/22 0234

## 2022-02-26 NOTE — ED Triage Notes (Signed)
Pt arrives via [facility], pt c/o Painful urination since yesterday. 188/108, hr 80's, 99%

## 2022-02-26 NOTE — ED Triage Notes (Incomplete)
Pt reports does not urinate much, but when he does it hurts, he checked in last night but had to leave d/t dialysis appointment, however, he ended up missing. His last Dialysis was

## 2022-02-27 ENCOUNTER — Other Ambulatory Visit: Payer: Self-pay

## 2022-02-27 DIAGNOSIS — R300 Dysuria: Secondary | ICD-10-CM | POA: Diagnosis not present

## 2022-02-27 LAB — COMPREHENSIVE METABOLIC PANEL
ALT: 24 U/L (ref 0–44)
AST: 25 U/L (ref 15–41)
Albumin: 2.7 g/dL — ABNORMAL LOW (ref 3.5–5.0)
Alkaline Phosphatase: 220 U/L — ABNORMAL HIGH (ref 38–126)
Anion gap: 16 — ABNORMAL HIGH (ref 5–15)
BUN: 91 mg/dL — ABNORMAL HIGH (ref 6–20)
CO2: 18 mmol/L — ABNORMAL LOW (ref 22–32)
Calcium: 7.3 mg/dL — ABNORMAL LOW (ref 8.9–10.3)
Chloride: 102 mmol/L (ref 98–111)
Creatinine, Ser: 12.86 mg/dL — ABNORMAL HIGH (ref 0.61–1.24)
GFR, Estimated: 4 mL/min — ABNORMAL LOW (ref >60)
Glucose, Bld: 130 mg/dL — ABNORMAL HIGH (ref 70–99)
Potassium: 4.5 mmol/L (ref 3.5–5.1)
Sodium: 136 mmol/L (ref 135–145)
Total Bilirubin: 0.5 mg/dL (ref 0.3–1.2)
Total Protein: 6.5 g/dL (ref 6.5–8.1)

## 2022-02-27 LAB — CBC WITH DIFFERENTIAL/PLATELET
Abs Immature Granulocytes: 0.03 10*3/uL (ref 0.00–0.07)
Basophils Absolute: 0 10*3/uL (ref 0.0–0.1)
Basophils Relative: 1 %
Eosinophils Absolute: 0.2 10*3/uL (ref 0.0–0.5)
Eosinophils Relative: 4 %
HCT: 36.8 % — ABNORMAL LOW (ref 39.0–52.0)
Hemoglobin: 11.6 g/dL — ABNORMAL LOW (ref 13.0–17.0)
Immature Granulocytes: 1 %
Lymphocytes Relative: 14 %
Lymphs Abs: 0.9 10*3/uL (ref 0.7–4.0)
MCH: 29.1 pg (ref 26.0–34.0)
MCHC: 31.5 g/dL (ref 30.0–36.0)
MCV: 92.2 fL (ref 80.0–100.0)
Monocytes Absolute: 0.8 10*3/uL (ref 0.1–1.0)
Monocytes Relative: 14 %
Neutro Abs: 4.1 10*3/uL (ref 1.7–7.7)
Neutrophils Relative %: 66 %
Platelets: 332 10*3/uL (ref 150–400)
RBC: 3.99 MIL/uL — ABNORMAL LOW (ref 4.22–5.81)
RDW: 14.3 % (ref 11.5–15.5)
WBC: 6.1 10*3/uL (ref 4.0–10.5)
nRBC: 0 % (ref 0.0–0.2)

## 2022-02-27 LAB — LIPASE, BLOOD: Lipase: 36 U/L (ref 11–51)

## 2022-02-27 MED ORDER — AMLODIPINE BESYLATE 10 MG PO TABS
ORAL_TABLET | ORAL | 3 refills | Status: DC
  Filled 2022-02-27: qty 30, 30d supply, fill #0

## 2022-02-27 MED ORDER — CIPROFLOXACIN HCL 500 MG PO TABS
500.0000 mg | ORAL_TABLET | Freq: Every day | ORAL | 0 refills | Status: AC
  Filled 2022-02-27: qty 3, 3d supply, fill #0

## 2022-02-27 MED ORDER — INSULIN GLARGINE 100 UNIT/ML SOLOSTAR PEN
PEN_INJECTOR | SUBCUTANEOUS | 6 refills | Status: DC
  Filled 2022-02-27: qty 3, 30d supply, fill #0

## 2022-02-27 MED ORDER — LEVEMIR FLEXTOUCH 100 UNIT/ML ~~LOC~~ SOPN
PEN_INJECTOR | SUBCUTANEOUS | 6 refills | Status: DC
  Filled 2022-02-27: qty 9, 25d supply, fill #0

## 2022-02-27 MED ORDER — SITAGLIPTIN PHOSPHATE 25 MG PO TABS
ORAL_TABLET | ORAL | 3 refills | Status: DC
  Filled 2022-02-27: qty 30, 30d supply, fill #0

## 2022-02-27 NOTE — ED Notes (Signed)
Patient refusing to wake up for vitals

## 2022-02-27 NOTE — ED Provider Triage Note (Signed)
  Emergency Medicine Provider Triage Evaluation Note  MRN:  [id number]  Arrival date & time: 02/27/22    Medically screening exam initiated at 12:11 AM.   CC:   Dysuria  HPI:  KASHAUN BEBO is a 51 y.o. year-old male presents to the ED with chief complaint of dysuria and urgency for the past day.  States that he has burning with urination.  Denies discharge.  Reports urinary hesitancy.  History provided by patient. ROS:  -As included in HPI PE:   Vitals:   02/27/22 0014  BP: (!) 185/101  Pulse: 83  Resp: 17  Temp: 98.3 F (36.8 C)  SpO2: 97%    Non-toxic appearing No respiratory distress  MDM:  Based on signs and symptoms, UTI is highest on my differential, followed by STD. I've ordered labs in triage to expedite lab/diagnostic workup.  Patient was informed that the remainder of the evaluation will be completed by another provider, this initial triage assessment does not replace that evaluation, and the importance of remaining in the ED until their evaluation is complete.    Montine Circle, PA-C 02/27/22 [phone]

## 2022-02-27 NOTE — ED Notes (Signed)
Patient refuses vitals and fell back asleep.

## 2022-02-27 NOTE — ED Notes (Signed)
Called patient several times patient didn't answer  

## 2022-03-02 ENCOUNTER — Other Ambulatory Visit: Payer: Self-pay

## 2022-03-02 ENCOUNTER — Emergency Department (HOSPITAL_COMMUNITY)
Admission: EM | Admit: 2022-03-02 | Discharge: 2022-03-02 | Disposition: A | Discharge status: Home / Self Care | Payer: Commercial Managed Care - HMO | Attending: Emergency Medicine | Admitting: Emergency Medicine

## 2022-03-02 ENCOUNTER — Encounter (HOSPITAL_COMMUNITY): Payer: Self-pay

## 2022-03-02 DIAGNOSIS — Z794 Long term (current) use of insulin: Secondary | ICD-10-CM | POA: Diagnosis not present

## 2022-03-02 DIAGNOSIS — R300 Dysuria: Secondary | ICD-10-CM | POA: Diagnosis present

## 2022-03-02 DIAGNOSIS — Z992 Dependence on renal dialysis: Secondary | ICD-10-CM | POA: Diagnosis not present

## 2022-03-02 DIAGNOSIS — Z79899 Other long term (current) drug therapy: Secondary | ICD-10-CM | POA: Diagnosis not present

## 2022-03-02 DIAGNOSIS — Z7984 Long term (current) use of oral hypoglycemic drugs: Secondary | ICD-10-CM | POA: Insufficient documentation

## 2022-03-02 MED ORDER — PHENAZOPYRIDINE HCL 100 MG PO TABS
95.0000 mg | ORAL_TABLET | Freq: Once | ORAL | Status: AC
  Administered 2022-03-02: 100 mg via ORAL
  Filled 2022-03-02: qty 1

## 2022-03-02 MED ORDER — PHENAZOPYRIDINE HCL 200 MG PO TABS
200.0000 mg | ORAL_TABLET | Freq: Three times a day (TID) | ORAL | 0 refills | Status: DC
  Filled 2022-03-02 – 2022-03-04 (×2): qty 6, 2d supply, fill #0

## 2022-03-02 NOTE — ED Notes (Signed)
Attempting to triage the patient but he reports he has to urinate at this time. Pt ambulatory to restroom with independent steady gait. Pt was given a urine specimen cup to provide sample if he is able to produce urine.

## 2022-03-02 NOTE — ED Triage Notes (Signed)
PER EMS: pt is from home with c/o dysuria x 6 days, reports he was taking unknown abx for UTI but it is still burning when he urinates, which is not much due to his dialysis. He missed dialysis today.  BP- 164/104, HR-84, 97% RA, CBG 232

## 2022-03-02 NOTE — ED Notes (Signed)
Pt verbalized understanding of d/c instructions, prescription and follow up care.

## 2022-03-02 NOTE — ED Notes (Signed)
He reports he was not able to provide a urine sample

## 2022-03-02 NOTE — ED Provider Notes (Signed)
[name] Provider Note   CSN: [id number] Arrival date & time: 03/02/22  1940     History  Chief Complaint  Patient presents with   Dysuria    [name] is a 51 y.o. male.  51 year old male brought in by EMS with concern for dysuria.  Patient states this has been ongoing for the past 6 days, went to his PCP who gave him 3 days of a once daily antibiotic, completed this today, unsure what it was but states he continues to have dysuria.  He denies fevers, vomiting, abdominal pain.  States that he is not sexually active and denies STD testing.  Patient is a dialysis patient, does not make urine.       Home Medications Prior to Admission medications   Medication Sig Start Date End Date Taking? Authorizing Provider  phenazopyridine (PYRIDIUM) 200 MG tablet Take 1 tablet (200 mg total) by mouth 3 (three) times daily. 03/02/22  Yes Tacy Learn, PA-C  amLODipine (NORVASC) 10 MG tablet Take 1 tablet by mouth once a day 11/20/21     amLODipine (NORVASC) 10 MG tablet Take 1 tablet (10 mg total) by mouth daily. 01/21/22 01/21/23  Ajibola, Rosezella [place] A, NP  amLODipine (NORVASC) 10 MG tablet take 1 tablet by mouth daily 02/27/22     amoxicillin-clavulanate (AUGMENTIN) 500-125 MG tablet Take 1 tablet (500 mg total) by mouth daily. 11/08/21   [name], MD  ciprofloxacin (CIPRO) 500 MG tablet Take 1 tablet (500 mg total) by mouth daily on non HD days for 3 days. 02/27/22 03/02/22    doxycycline (VIBRA-TABS) 100 MG tablet Take 1 tablet (100 mg total) by mouth 2 (two) times daily. 01/19/22   Larene Pickett, PA-C  escitalopram (LEXAPRO) 5 MG tablet Take 1 tablet (5 mg total) by mouth daily. 01/21/22 01/21/23  Ajibola, Ene A, NP  glucose blood test strip Use to check fasting blood sugar once daily. 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 Patient not taking: Reported on 09/28/2021 02/26/21   [name], [name], MD  insulin detemir (LEVEMIR FLEXTOUCH) 100 UNIT/ML FlexPen Inject 35 units into the skin once a day 02/27/22     insulin glargine (LANTUS) 100 UNIT/ML Solostar Pen Inject 10 Units into the skin daily. May substitute with any other long-acting formulary. Patient not taking: Reported on 09/28/2021 05/13/21   [name], MD  insulin glargine (LANTUS) 100 UNIT/ML Solostar Pen Inject 5 units into the skin twice a day with a meal 02/27/22     Insulin Pen Needle 32G X 4 MM MISC Use with insulin pens 02/26/21   [name], [name], MD  RENVELA 800 MG tablet Take 800-1,600 mg by mouth See admin instructions. Take 2 tablets (1600 mg) three times a day with meals and 1 tablet (800 mg) twice a day as needed with snacks Patient not taking: Reported on 11/06/2021 10/25/21   [provider]  sevelamer carbonate (RENVELA) 800 MG tablet Take 2 tablet by mouth three times a day with meals and 1 tablet twice daily as needed with snacks 11/20/21     sitaGLIPtin (JANUVIA) 25 MG tablet take 1 tablet by mouth daily 02/27/22     triamcinolone cream (KENALOG) 0.1 % Apply 1 Application topically 2 (two) times daily. 01/06/22   Nyoka Lint, PA-C  gabapentin (  NEURONTIN) 300 MG capsule Take 1 capsule (300 mg total) by mouth 3 (three) times daily. Patient not taking: Reported on 07/27/2019 07/29/18 08/30/19  [name], FNP  sildenafil (VIAGRA) 25 MG tablet Take 1 tablet (25 mg total) by mouth as needed for erectile dysfunction. for erectile dysfunction. Patient not taking: Reported on 08/11/2020 10/13/19 11/27/20  [name], FNP      Allergies    Sulfa antibiotics, Keflex [cephalexin], Other, and Bactrim [sulfamethoxazole-trimethoprim]    Review of Systems   Review of Systems Negative except as per HPI Physical Exam Updated Vital Signs BP (!) 176/135   Pulse 90   Temp 98.1 F (36.7 C)   Resp 18   SpO2  97%  Physical Exam Vitals and nursing note reviewed.  Constitutional:      General: He is not in acute distress.    Appearance: He is well-developed. He is not diaphoretic.  HENT:     Head: Normocephalic and atraumatic.  Pulmonary:     Effort: Pulmonary effort is normal.  Neurological:     Mental Status: He is alert and oriented to person, place, and time.     Gait: Gait normal.  Psychiatric:        Behavior: Behavior normal.     ED Results / Procedures / Treatments   Labs (all labs ordered are listed, but only abnormal results are displayed) Labs Reviewed - No data to display  EKG None  Radiology No results found.  Procedures Procedures    Medications Ordered in ED Medications  phenazopyridine (PYRIDIUM) tablet 100 mg (100 mg Oral Given 03/02/22 2011)    ED Course/ Medical Decision Making/ A&P                           Medical Decision Making Risk Prescription drug management.   51 year old male presents the emergency room with concern for dysuria as above. He does not make urine, is unable to provide a sample.  He notes that his dysuria is when he is trying to void.  He refuses a gonorrhea chlamydia swab and genital exam today.  Review of med rec, patient was prescribed Cipro to take for 3 days.  Labs obtained on 02/27/2022 reviewed including CBC and CMP, no significant changes from patient's prior and baseline labs.  Patient is provided with a dose of Pyridium in the emergency room, prescription for same.  Advised to recheck with his primary care provider.        Final Clinical Impression(s) / ED Diagnoses Final diagnoses:  Dysuria    Rx / DC Orders ED Discharge Orders          Ordered    phenazopyridine (PYRIDIUM) 200 MG tablet  3 times daily        03/02/22 2004              Roque Lias 03/02/22 2015    Noemi Chapel, MD 03/05/22 [phone]

## 2022-03-02 NOTE — Discharge Instructions (Signed)
Take Pyridium as prescribed for dysuria.  Follow-up with your doctor.

## 2022-03-04 ENCOUNTER — Other Ambulatory Visit: Payer: Self-pay

## 2022-03-05 ENCOUNTER — Other Ambulatory Visit: Payer: Self-pay

## 2022-03-05 ENCOUNTER — Emergency Department (HOSPITAL_COMMUNITY)
Admission: EM | Admit: 2022-03-05 | Discharge: 2022-03-06 | Discharge status: Against Medical Advice | Payer: Commercial Managed Care - HMO | Attending: Physician Assistant | Admitting: Physician Assistant

## 2022-03-05 ENCOUNTER — Emergency Department (HOSPITAL_COMMUNITY): Payer: Commercial Managed Care - HMO

## 2022-03-05 ENCOUNTER — Encounter (HOSPITAL_COMMUNITY): Payer: Self-pay | Admitting: Emergency Medicine

## 2022-03-05 DIAGNOSIS — R0789 Other chest pain: Secondary | ICD-10-CM | POA: Diagnosis present

## 2022-03-05 DIAGNOSIS — R1110 Vomiting, unspecified: Secondary | ICD-10-CM | POA: Diagnosis not present

## 2022-03-05 DIAGNOSIS — Z5321 Procedure and treatment not carried out due to patient leaving prior to being seen by health care provider: Secondary | ICD-10-CM | POA: Diagnosis not present

## 2022-03-05 DIAGNOSIS — R0602 Shortness of breath: Secondary | ICD-10-CM | POA: Diagnosis not present

## 2022-03-05 LAB — COMPREHENSIVE METABOLIC PANEL
ALT: 31 U/L (ref 0–44)
AST: 31 U/L (ref 15–41)
Albumin: 2.8 g/dL — ABNORMAL LOW (ref 3.5–5.0)
Alkaline Phosphatase: 256 U/L — ABNORMAL HIGH (ref 38–126)
Anion gap: 18 — ABNORMAL HIGH (ref 5–15)
BUN: 82 mg/dL — ABNORMAL HIGH (ref 6–20)
CO2: 16 mmol/L — ABNORMAL LOW (ref 22–32)
Calcium: 6.9 mg/dL — ABNORMAL LOW (ref 8.9–10.3)
Chloride: 104 mmol/L (ref 98–111)
Creatinine, Ser: 12.82 mg/dL — ABNORMAL HIGH (ref 0.61–1.24)
GFR, Estimated: 4 mL/min — ABNORMAL LOW (ref >60)
Glucose, Bld: 116 mg/dL — ABNORMAL HIGH (ref 70–99)
Potassium: 4 mmol/L (ref 3.5–5.1)
Sodium: 138 mmol/L (ref 135–145)
Total Bilirubin: 0.5 mg/dL (ref 0.3–1.2)
Total Protein: 7.1 g/dL (ref 6.5–8.1)

## 2022-03-05 LAB — LIPASE, BLOOD: Lipase: 39 U/L (ref 11–51)

## 2022-03-05 LAB — TROPONIN I (HIGH SENSITIVITY): Troponin I (High Sensitivity): 163 ng/L (ref <18)

## 2022-03-05 MED ORDER — ONDANSETRON 4 MG PO TBDP
4.0000 mg | ORAL_TABLET | Freq: Once | ORAL | Status: AC
  Administered 2022-03-05: 4 mg via ORAL
  Filled 2022-03-05: qty 1

## 2022-03-05 MED ORDER — ALUM & MAG HYDROXIDE-SIMETH 200-200-20 MG/5ML PO SUSP
30.0000 mL | Freq: Once | ORAL | Status: AC
  Administered 2022-03-05: 30 mL via ORAL
  Filled 2022-03-05: qty 30

## 2022-03-05 NOTE — ED Notes (Signed)
Dr. Billy Fischer ( EDP) notified on patient's elevated Troponin result.

## 2022-03-05 NOTE — ED Triage Notes (Addendum)
Patient arrived with EMS from street missed 2 hemodialysis treatment , reports central chest pain with SOB and multiple emesis . He received ASA 324 mg and 1 NTG sl by EMS  prior to arrival .

## 2022-03-05 NOTE — ED Provider Triage Note (Signed)
Emergency Medicine Provider Triage Evaluation Note  [name] , a 51 y.o. male  was evaluated in triage.  Pt complains of chest pain which started several hours ago.  Patient states that he was eating when he started to have chest pain.  He had an episode of emesis.  Pain is in his center of his chest and burning.  Pain does not radiate.  He also endorses generalized abdominal pain.  Denies any fevers or chills.  Denies any dysuria or hematuria.  Normal bowel movements.  Denies any shortness of breath, near syncope.  Review of Systems  Positive: As above  Negative: As above   Physical Exam  There were no vitals taken for this visit. Gen:   Awake, no distress   Resp:  Normal effort  MSK:   Moves extremities without difficulty  Other:  Abdomen soft and nontender   Medical Decision Making  Medically screening exam initiated at 10:53 PM.  Appropriate orders placed.  [name] was informed that the remainder of the evaluation will be completed by another provider, this initial triage assessment does not replace that evaluation, and the importance of remaining in the ED until their evaluation is complete.     Garald Balding, PA-C 03/05/22 2254

## 2022-03-06 LAB — CBC WITH DIFFERENTIAL/PLATELET
Abs Immature Granulocytes: 0.05 10*3/uL (ref 0.00–0.07)
Basophils Absolute: 0 10*3/uL (ref 0.0–0.1)
Basophils Relative: 0 %
Eosinophils Absolute: 0.2 10*3/uL (ref 0.0–0.5)
Eosinophils Relative: 3 %
HCT: 37 % — ABNORMAL LOW (ref 39.0–52.0)
Hemoglobin: 11.3 g/dL — ABNORMAL LOW (ref 13.0–17.0)
Immature Granulocytes: 1 %
Lymphocytes Relative: 9 %
Lymphs Abs: 0.7 10*3/uL (ref 0.7–4.0)
MCH: 28 pg (ref 26.0–34.0)
MCHC: 30.5 g/dL (ref 30.0–36.0)
MCV: 91.8 fL (ref 80.0–100.0)
Monocytes Absolute: 0.6 10*3/uL (ref 0.1–1.0)
Monocytes Relative: 7 %
Neutro Abs: 6.4 10*3/uL (ref 1.7–7.7)
Neutrophils Relative %: 80 %
Platelets: 345 10*3/uL (ref 150–400)
RBC: 4.03 MIL/uL — ABNORMAL LOW (ref 4.22–5.81)
RDW: 14.2 % (ref 11.5–15.5)
WBC: 8 10*3/uL (ref 4.0–10.5)
nRBC: 0 % (ref 0.0–0.2)

## 2022-03-06 LAB — TROPONIN I (HIGH SENSITIVITY): Troponin I (High Sensitivity): 179 ng/L (ref <18)

## 2022-03-07 ENCOUNTER — Emergency Department (HOSPITAL_COMMUNITY): Payer: Commercial Managed Care - HMO

## 2022-03-07 ENCOUNTER — Emergency Department (HOSPITAL_COMMUNITY)
Admission: EM | Admit: 2022-03-07 | Discharge: 2022-03-08 | Discharge status: Against Medical Advice | Payer: Commercial Managed Care - HMO | Attending: Emergency Medicine | Admitting: Emergency Medicine

## 2022-03-07 ENCOUNTER — Encounter (HOSPITAL_COMMUNITY): Payer: Self-pay | Admitting: Emergency Medicine

## 2022-03-07 DIAGNOSIS — Z5321 Procedure and treatment not carried out due to patient leaving prior to being seen by health care provider: Secondary | ICD-10-CM | POA: Insufficient documentation

## 2022-03-07 DIAGNOSIS — R079 Chest pain, unspecified: Secondary | ICD-10-CM | POA: Diagnosis present

## 2022-03-07 DIAGNOSIS — Z992 Dependence on renal dialysis: Secondary | ICD-10-CM | POA: Diagnosis not present

## 2022-03-07 LAB — COMPREHENSIVE METABOLIC PANEL
ALT: 28 U/L (ref 0–44)
AST: 29 U/L (ref 15–41)
Albumin: 2.8 g/dL — ABNORMAL LOW (ref 3.5–5.0)
Alkaline Phosphatase: 245 U/L — ABNORMAL HIGH (ref 38–126)
Anion gap: 20 — ABNORMAL HIGH (ref 5–15)
BUN: 94 mg/dL — ABNORMAL HIGH (ref 6–20)
CO2: 16 mmol/L — ABNORMAL LOW (ref 22–32)
Calcium: 6.7 mg/dL — ABNORMAL LOW (ref 8.9–10.3)
Chloride: 103 mmol/L (ref 98–111)
Creatinine, Ser: 13.83 mg/dL — ABNORMAL HIGH (ref 0.61–1.24)
GFR, Estimated: 4 mL/min — ABNORMAL LOW (ref >60)
Glucose, Bld: 195 mg/dL — ABNORMAL HIGH (ref 70–99)
Potassium: 4.6 mmol/L (ref 3.5–5.1)
Sodium: 139 mmol/L (ref 135–145)
Total Bilirubin: 0.7 mg/dL (ref 0.3–1.2)
Total Protein: 6.7 g/dL (ref 6.5–8.1)

## 2022-03-07 LAB — CBC
HCT: 38.6 % — ABNORMAL LOW (ref 39.0–52.0)
Hemoglobin: 11.8 g/dL — ABNORMAL LOW (ref 13.0–17.0)
MCH: 28.5 pg (ref 26.0–34.0)
MCHC: 30.6 g/dL (ref 30.0–36.0)
MCV: 93.2 fL (ref 80.0–100.0)
Platelets: 310 10*3/uL (ref 150–400)
RBC: 4.14 MIL/uL — ABNORMAL LOW (ref 4.22–5.81)
RDW: 14.5 % (ref 11.5–15.5)
WBC: 6.8 10*3/uL (ref 4.0–10.5)
nRBC: 0 % (ref 0.0–0.2)

## 2022-03-07 LAB — TROPONIN I (HIGH SENSITIVITY)
Troponin I (High Sensitivity): 148 ng/L (ref <18)
Troponin I (High Sensitivity): 140 ng/L (ref <18)

## 2022-03-07 NOTE — ED Provider Triage Note (Signed)
Emergency Medicine Provider Triage Evaluation Note  [name] , a 51 y.o. male  was evaluated in triage.  Pt complains of chest pain.  Patient states pain has been ongoing for 1 hour.  Patient is unable to elaborate on severity or quality of pain.  He denies shortness of breath at this time.  He does endorse missing his past 3 dialysis treatments, including Saturday, Tuesday, and today.  Review of Systems  Positive: As above Negative: As above  Physical Exam  BP (!) 189/96 (BP Location: Right Arm)   Pulse 84   Temp 98.2 F (36.8 C) (Oral)   Resp 18   Ht 5\' 7"  (1.702 m)   Wt 72 kg   SpO2 98%   BMI 24.86 kg/m  Gen:   Awake, no distress   Resp:  Normal effort  MSK:   Moves extremities without difficulty  Other:    Medical Decision Making  Medically screening exam initiated at 6:23 PM.  Appropriate orders placed.  [name] was informed that the remainder of the evaluation will be completed by another provider, this initial triage assessment does not replace that evaluation, and the importance of remaining in the ED until their evaluation is complete.     Dorothyann Peng, PA-C 03/07/22 1826

## 2022-03-07 NOTE — ED Triage Notes (Signed)
Pt arrives via EMS from the [facility] with CP. Pt missed last couple HD treatments, last went last Thursday. EMS gave 324 ASA.

## 2022-03-08 NOTE — ED Notes (Signed)
Called pt x3 for vitals, no response. This NT and Vera(NT) checked the whole lobby vital signs.

## 2022-03-09 ENCOUNTER — Encounter (HOSPITAL_COMMUNITY): Payer: Self-pay | Admitting: Emergency Medicine

## 2022-03-09 ENCOUNTER — Emergency Department (HOSPITAL_COMMUNITY)
Admission: EM | Admit: 2022-03-09 | Discharge: 2022-03-09 | Discharge status: Against Medical Advice | Payer: Commercial Managed Care - HMO | Attending: Emergency Medicine | Admitting: Emergency Medicine

## 2022-03-09 ENCOUNTER — Other Ambulatory Visit: Payer: Self-pay

## 2022-03-09 DIAGNOSIS — Z992 Dependence on renal dialysis: Secondary | ICD-10-CM | POA: Insufficient documentation

## 2022-03-09 DIAGNOSIS — Z5321 Procedure and treatment not carried out due to patient leaving prior to being seen by health care provider: Secondary | ICD-10-CM | POA: Diagnosis not present

## 2022-03-09 DIAGNOSIS — R0602 Shortness of breath: Secondary | ICD-10-CM | POA: Diagnosis present

## 2022-03-09 DIAGNOSIS — R2243 Localized swelling, mass and lump, lower limb, bilateral: Secondary | ICD-10-CM | POA: Insufficient documentation

## 2022-03-09 LAB — BASIC METABOLIC PANEL
Anion gap: 16 — ABNORMAL HIGH (ref 5–15)
BUN: 101 mg/dL — ABNORMAL HIGH (ref 6–20)
CO2: 16 mmol/L — ABNORMAL LOW (ref 22–32)
Calcium: 6.3 mg/dL — CL (ref 8.9–10.3)
Chloride: 105 mmol/L (ref 98–111)
Creatinine, Ser: 13.99 mg/dL — ABNORMAL HIGH (ref 0.61–1.24)
GFR, Estimated: 4 mL/min — ABNORMAL LOW (ref >60)
Glucose, Bld: 167 mg/dL — ABNORMAL HIGH (ref 70–99)
Potassium: 5.2 mmol/L — ABNORMAL HIGH (ref 3.5–5.1)
Sodium: 137 mmol/L (ref 135–145)

## 2022-03-09 LAB — CBC
HCT: 37.6 % — ABNORMAL LOW (ref 39.0–52.0)
Hemoglobin: 11.7 g/dL — ABNORMAL LOW (ref 13.0–17.0)
MCH: 28.7 pg (ref 26.0–34.0)
MCHC: 31.1 g/dL (ref 30.0–36.0)
MCV: 92.4 fL (ref 80.0–100.0)
Platelets: 307 10*3/uL (ref 150–400)
RBC: 4.07 MIL/uL — ABNORMAL LOW (ref 4.22–5.81)
RDW: 14.5 % (ref 11.5–15.5)
WBC: 7.4 10*3/uL (ref 4.0–10.5)
nRBC: 0 % (ref 0.0–0.2)

## 2022-03-09 NOTE — ED Notes (Signed)
Called for pt x3. This NT found pt labels.

## 2022-03-09 NOTE — ED Triage Notes (Signed)
Pt c/o shortness of breath and bilateral leg edema. Dialysis Tues/Thurs/Sat. Last dialysis on Thursday. EMS VS: BP 178/112, HR 86, RR 16, SpO2 97% room air.

## 2022-03-09 NOTE — ED Provider Triage Note (Signed)
  Emergency Medicine Provider Triage Evaluation Note  MRN:  [id number]  Arrival date & time: 03/09/22    Medically screening exam initiated at 1:09 AM.   CC:   Shortness of Breath   HPI:  [name] is a 51 y.o. year-old male presents to the ED with chief complaint of SOB.  States he's not had dialysis since last Thursday.  Complains of worsening SOB.  Denies any other complaints.  History provided by patient. ROS:  -As included in HPI PE:   Vitals:   03/09/22 0105  BP: (!) 176/95  Pulse: 84  Resp: 16  Temp: 98.2 F (36.8 C)  SpO2: 93%    Non-toxic appearing No respiratory distress  MDM:  Based on signs and symptoms, fluid overload is highest on my differential. I've ordered labs in triage to expedite lab/diagnostic workup.  Patient was informed that the remainder of the evaluation will be completed by another provider, this initial triage assessment does not replace that evaluation, and the importance of remaining in the ED until their evaluation is complete.    Montine Circle, PA-C 03/09/22 0109

## 2022-03-10 ENCOUNTER — Other Ambulatory Visit: Payer: Self-pay

## 2022-03-10 ENCOUNTER — Emergency Department (HOSPITAL_COMMUNITY)
Admission: EM | Admit: 2022-03-10 | Discharge: 2022-03-11 | Disposition: A | Discharge status: Home / Self Care | Payer: Commercial Managed Care - HMO | Attending: Emergency Medicine | Admitting: Emergency Medicine

## 2022-03-10 ENCOUNTER — Encounter (HOSPITAL_COMMUNITY): Payer: Self-pay | Admitting: *Deleted

## 2022-03-10 DIAGNOSIS — I1311 Hypertensive heart and chronic kidney disease without heart failure, with stage 5 chronic kidney disease, or end stage renal disease: Secondary | ICD-10-CM | POA: Diagnosis not present

## 2022-03-10 DIAGNOSIS — Z794 Long term (current) use of insulin: Secondary | ICD-10-CM | POA: Insufficient documentation

## 2022-03-10 DIAGNOSIS — I5043 Acute on chronic combined systolic (congestive) and diastolic (congestive) heart failure: Secondary | ICD-10-CM | POA: Insufficient documentation

## 2022-03-10 DIAGNOSIS — Z79899 Other long term (current) drug therapy: Secondary | ICD-10-CM | POA: Diagnosis not present

## 2022-03-10 DIAGNOSIS — R1033 Periumbilical pain: Secondary | ICD-10-CM | POA: Diagnosis present

## 2022-03-10 DIAGNOSIS — E1122 Type 2 diabetes mellitus with diabetic chronic kidney disease: Secondary | ICD-10-CM | POA: Diagnosis not present

## 2022-03-10 DIAGNOSIS — N186 End stage renal disease: Secondary | ICD-10-CM | POA: Diagnosis not present

## 2022-03-10 DIAGNOSIS — R109 Unspecified abdominal pain: Secondary | ICD-10-CM | POA: Diagnosis not present

## 2022-03-10 DIAGNOSIS — T730XXA Starvation, initial encounter: Secondary | ICD-10-CM | POA: Diagnosis not present

## 2022-03-10 NOTE — ED Triage Notes (Signed)
Pt states pain at his umbilical area that started 1 hour ago. Has had diarrhea on an off "for days" Denies urinary symptoms.  Last dialysis yesterday.

## 2022-03-11 ENCOUNTER — Other Ambulatory Visit: Payer: Self-pay

## 2022-03-11 LAB — CBC
HCT: 37.9 % — ABNORMAL LOW (ref 39.0–52.0)
Hemoglobin: 12.2 g/dL — ABNORMAL LOW (ref 13.0–17.0)
MCH: 29 pg (ref 26.0–34.0)
MCHC: 32.2 g/dL (ref 30.0–36.0)
MCV: 90 fL (ref 80.0–100.0)
Platelets: 313 10*3/uL (ref 150–400)
RBC: 4.21 MIL/uL — ABNORMAL LOW (ref 4.22–5.81)
RDW: 14.5 % (ref 11.5–15.5)
WBC: 6.5 10*3/uL (ref 4.0–10.5)
nRBC: 0 % (ref 0.0–0.2)

## 2022-03-11 LAB — COMPREHENSIVE METABOLIC PANEL
ALT: 33 U/L (ref 0–44)
AST: 30 U/L (ref 15–41)
Albumin: 2.8 g/dL — ABNORMAL LOW (ref 3.5–5.0)
Alkaline Phosphatase: 256 U/L — ABNORMAL HIGH (ref 38–126)
Anion gap: 13 (ref 5–15)
BUN: 64 mg/dL — ABNORMAL HIGH (ref 6–20)
CO2: 20 mmol/L — ABNORMAL LOW (ref 22–32)
Calcium: 7.6 mg/dL — ABNORMAL LOW (ref 8.9–10.3)
Chloride: 104 mmol/L (ref 98–111)
Creatinine, Ser: 10.08 mg/dL — ABNORMAL HIGH (ref 0.61–1.24)
GFR, Estimated: 6 mL/min — ABNORMAL LOW (ref >60)
Glucose, Bld: 162 mg/dL — ABNORMAL HIGH (ref 70–99)
Potassium: 3.9 mmol/L (ref 3.5–5.1)
Sodium: 137 mmol/L (ref 135–145)
Total Bilirubin: 0.5 mg/dL (ref 0.3–1.2)
Total Protein: 7.1 g/dL (ref 6.5–8.1)

## 2022-03-11 LAB — LIPASE, BLOOD: Lipase: 33 U/L (ref 11–51)

## 2022-03-11 NOTE — ED Notes (Signed)
Attempted to take pt back to be roomed. Pt sleeping after multiple attempts to room them. Pt states "I'm up" and then proceeds to fall asleep and refuses to cooperate.

## 2022-03-11 NOTE — ED Provider Notes (Signed)
[facility] EMERGENCY DEPARTMENT Provider Note  CSN: [id number] Arrival date & time: 03/10/22 2237  Chief Complaint(s) Abdominal Pain  HPI [name] is a 51 y.o. male     Abdominal Pain Pain location:  Periumbilical Pain quality: aching   Pain radiates to:  Does not radiate Pain severity:  Mild Timing:  Intermittent Progression:  Waxing and waning Chronicity:  Recurrent Context comment:  Hunger Relieved by:  Eating Worsened by:  Nothing Associated symptoms: diarrhea (intermittent)   Associated symptoms: no chest pain, no constipation, no cough, no nausea and no vomiting     Past Medical History Past Medical History:  Diagnosis Date   Anxiety 02/2019   Anxiety 10/2019   Diabetes mellitus type II, uncontrolled 07/17/2006   Elevated alkaline phosphatase level 01/2019   ESSENTIAL HYPERTENSION 07/17/2006   Homelessness 10/13/2021   HYPERLIPIDEMIA 11/24/2008   Insomnia 02/2019   Left bundle branch block 02/22/2019   Lesion of lip 10/2019   Pneumonia 10/22/2011   Suicidal ideations 02/2019   Vitamin D deficiency 01/2019   Patient Active Problem List   Diagnosis Date Noted   CHF (congestive heart failure) ([place]) 11/15/2021   Nausea vomiting and diarrhea 11/07/2021   Homelessness 10/13/2021   Acute respiratory failure with hypoxia ([place]) 10/13/2021   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 ([place]) 09/28/2021   Aspiration pneumonia ([place]) 09/28/2021   Volume overload 04/12/2021   Mild protein-calorie malnutrition ([place]) 03/06/2021   Aftercare including intermittent dialysis ([place]) 02/27/2021   Allergy, unspecified, initial encounter 02/27/2021   Anemia due to chronic kidney disease 02/27/2021   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   Secondary hyperparathyroidism of renal origin ([place]) 02/27/2021   Shortness of breath 02/27/2021   Type 2 diabetes mellitus with other diabetic kidney complication ([place]) 32/67/1245   Hypertensive  urgency 02/18/2021   Type 2 diabetes mellitus ([place]) 02/17/2021   Acute on chronic combined systolic and diastolic CHF (congestive heart failure) ([place]) 11/29/2020   Hypertensive crisis 11/27/2020   Stage 4 chronic kidney disease ([place]) 02/22/2020   Left ventricular dysfunction 04/27/2019   Hemoglobin A1C greater than 9%, indicating poor diabetic control 07/29/2018   LBBB (left bundle branch block)    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 Authorizing Provider  amLODipine (NORVASC) 10 MG tablet Take 1 tablet by mouth once a day 11/20/21     amLODipine (NORVASC) 10 MG tablet Take 1 tablet (10 mg total) by mouth daily. 01/21/22 01/21/23  Ajibola, Rosezella [place] A, NP  amLODipine (NORVASC) 10 MG tablet take 1 tablet by mouth daily 02/27/22     amoxicillin-clavulanate (AUGMENTIN) 500-125 MG tablet Take 1 tablet (500 mg total) by mouth daily. 11/08/21   [name], MD  doxycycline (VIBRA-TABS) 100 MG tablet Take 1 tablet (100 mg total) by mouth 2 (two) times daily. 01/19/22   Larene Pickett, PA-C  escitalopram (LEXAPRO) 5 MG tablet Take 1 tablet (5 mg total) by mouth daily. 01/21/22 01/21/23  Ajibola, Ene A, NP  glucose blood test strip Use to check fasting blood sugar once daily. 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 Insulin dependent 11/29/20   [name], MD  insulin aspart (  NOVOLOG FLEXPEN) 100 UNIT/ML FlexPen Inject 5 Units into the skin 2 (two) times daily with a meal. Patient not taking: Reported on 09/28/2021 02/26/21   [name], [name], MD  insulin detemir (LEVEMIR FLEXTOUCH) 100 UNIT/ML FlexPen Inject 35 units into the skin once a day 02/27/22     insulin glargine (LANTUS) 100  UNIT/ML Solostar Pen Inject 10 Units into the skin daily. May substitute with any other long-acting formulary. Patient not taking: Reported on 09/28/2021 05/13/21   [name], MD  insulin glargine (LANTUS) 100 UNIT/ML Solostar Pen Inject 5 units into the skin twice a day with a meal 02/27/22     Insulin Pen Needle 32G X 4 MM MISC Use with insulin pens 02/26/21   [name], [name], MD  phenazopyridine (PYRIDIUM) 200 MG tablet Take 1 tablet (200 mg total) by mouth 3 (three) times daily. 03/02/22   Tacy Learn, PA-C  RENVELA 800 MG tablet Take 800-1,600 mg by mouth See admin instructions. Take 2 tablets (1600 mg) three times a day with meals and 1 tablet (800 mg) twice a day as needed with snacks Patient not taking: Reported on 11/06/2021 10/25/21   [provider]  sevelamer carbonate (RENVELA) 800 MG tablet Take 2 tablet by mouth three times a day with meals and 1 tablet twice daily as needed with snacks 11/20/21     sitaGLIPtin (JANUVIA) 25 MG tablet take 1 tablet by mouth daily 02/27/22     triamcinolone cream (KENALOG) 0.1 % Apply 1 Application topically 2 (two) times daily. 01/06/22   Nyoka Lint, PA-C  gabapentin (NEURONTIN) 300 MG capsule Take 1 capsule (300 mg total) by mouth 3 (three) times daily. Patient not taking: Reported on 07/27/2019 07/29/18 08/30/19  [name], FNP  sildenafil (VIAGRA) 25 MG tablet Take 1 tablet (25 mg total) by mouth as needed for erectile dysfunction. for erectile dysfunction. Patient not taking: Reported on 08/11/2020 10/13/19 11/27/20  [name], FNP                                                                                                                                    Allergies Sulfa antibiotics, Keflex [cephalexin], Other, and Bactrim [sulfamethoxazole-trimethoprim]  Review of Systems Review of Systems  Respiratory:  Negative for cough.   Cardiovascular:  Negative for chest pain.  Gastrointestinal:  Positive for abdominal  pain and diarrhea (intermittent). Negative for constipation, nausea and vomiting.   As noted in HPI  Physical Exam Vital Signs  I have reviewed the triage vital signs BP (!) 178/106 (BP Location: Left Arm)   Pulse 85   Temp 97.9 F (36.6 C) (Oral)   Resp 17   SpO2 91%   Physical Exam Vitals reviewed.  Constitutional:      General: He is not in acute distress.    Appearance: He is well-developed. He is not diaphoretic.  HENT:     Head: Normocephalic  and atraumatic.     Right Ear: External ear normal.     Left Ear: External ear normal.     Nose: Nose normal.     Mouth/Throat:     Mouth: Mucous membranes are moist.  Eyes:     General: No scleral icterus.    Conjunctiva/sclera: Conjunctivae normal.  Neck:     Trachea: Phonation normal.  Cardiovascular:     Rate and Rhythm: Normal rate and regular rhythm.     Arteriovenous access: Right arteriovenous access is present. Pulmonary:     Effort: Pulmonary effort is normal. No respiratory distress.     Breath sounds: No stridor.  Abdominal:     General: There is no distension.     Tenderness: There is no abdominal tenderness. There is no guarding or rebound.  Musculoskeletal:        General: Normal range of motion.     Cervical back: Normal range of motion.  Neurological:     Mental Status: He is alert and oriented to person, place, and time.  Psychiatric:        Behavior: Behavior normal.     ED Results and Treatments Labs (all labs ordered are listed, but only abnormal results are displayed) Labs Reviewed  COMPREHENSIVE METABOLIC PANEL - Abnormal; Notable for the following components:      Result Value   CO2 20 (*)    Glucose, Bld 162 (*)    BUN 64 (*)    Creatinine, Ser 10.08 (*)    Calcium 7.6 (*)    Albumin 2.8 (*)    Alkaline Phosphatase 256 (*)    GFR, Estimated 6 (*)    All other components within normal limits  CBC - Abnormal; Notable for the following components:   RBC 4.21 (*)    Hemoglobin 12.2 (*)     HCT 37.9 (*)    All other components within normal limits  LIPASE, BLOOD  URINALYSIS, ROUTINE W REFLEX MICROSCOPIC                                                                                                                         EKG  EKG Interpretation  Date/Time:    Ventricular Rate:    PR Interval:    QRS Duration:   QT Interval:    QTC Calculation:   R Axis:     Text Interpretation:         Radiology No results found.  Medications Ordered in ED Medications - No data to display  Procedures Procedures  (including critical care time)  Medical Decision Making / ED Course   Medical Decision Making Amount and/or Complexity of Data Reviewed Labs: ordered. Decision-making details documented in ED Course.  Risk OTC drugs.   Complexity of Problem:  Co-morbidities/SDOH that complicate the patient evaluation/care: Homeless ESRD on HD TTS, DM  Patient's presenting problem/concern, DDX, and MDM listed below: Intermittent abd discomfort Related to hunger Abd benign Will get labs, but have low suspicion for serious intraabdominal inflammatory/infectious process requiring imaging  Initial Intervention:  Food and drink    Complexity of Data:   Laboratory Tests ordered listed below with my independent interpretation: CBC w/o leukocytosis or anemia CMP w/o significant electrolyte derangement. Baseline ERSD. No biliary obstruction or pancreatitis. No evidence of DKA     ED Course:    Assessment, Add'l Intervention, and Reassessment: Abd discomfort Functional vs hunger Tolerate PO Doubt emergent process.      Final Clinical Impression(s) / ED Diagnoses Final diagnoses:  Abdominal discomfort  Hungry, initial encounter   The patient appears reasonably screened and/or stabilized for discharge and I doubt any other medical  condition or other [facility] requiring further screening, evaluation, or treatment in the ED at this time. I have discussed the findings, Dx and Tx plan with the patient/family who expressed understanding and agree(s) with the plan. Discharge instructions discussed at length. The patient/family was given strict return precautions who verbalized understanding of the instructions. No further questions at time of discharge.  Disposition: Discharge  Condition: Good  ED Discharge Orders     None       Follow Up: Primary care provider  Call  if you do not have a primary care physician, contact [facility] at [phone] for referral           This chart was dictated using voice recognition software.  Despite best efforts to proofread,  errors can occur which can change the documentation meaning.    Fatima Blank, MD 03/11/22 [phone]

## 2022-03-19 IMAGING — CR DG CHEST 2V
2 series · 2 of 2 positions shown · non-contrast
Comparison: 05/27/2021

CLINICAL DATA: Dyspnea, missed dialysis

EXAM:
CHEST - 2 VIEW

[chest lat]
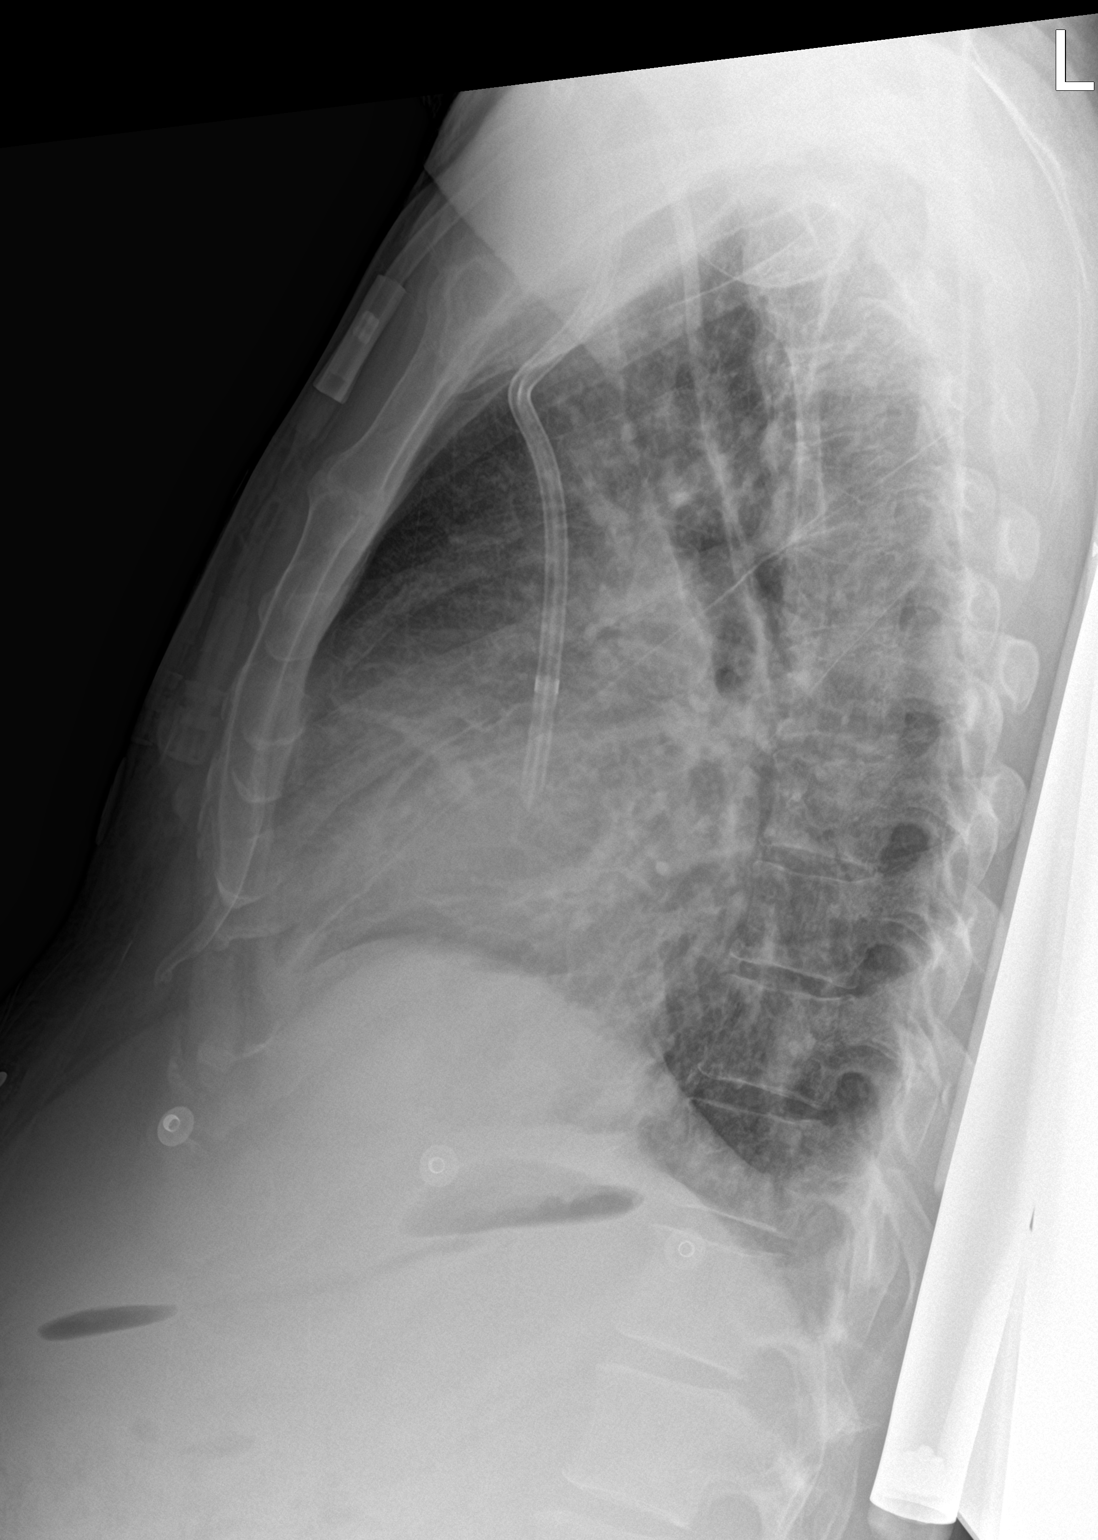

[chest ap]
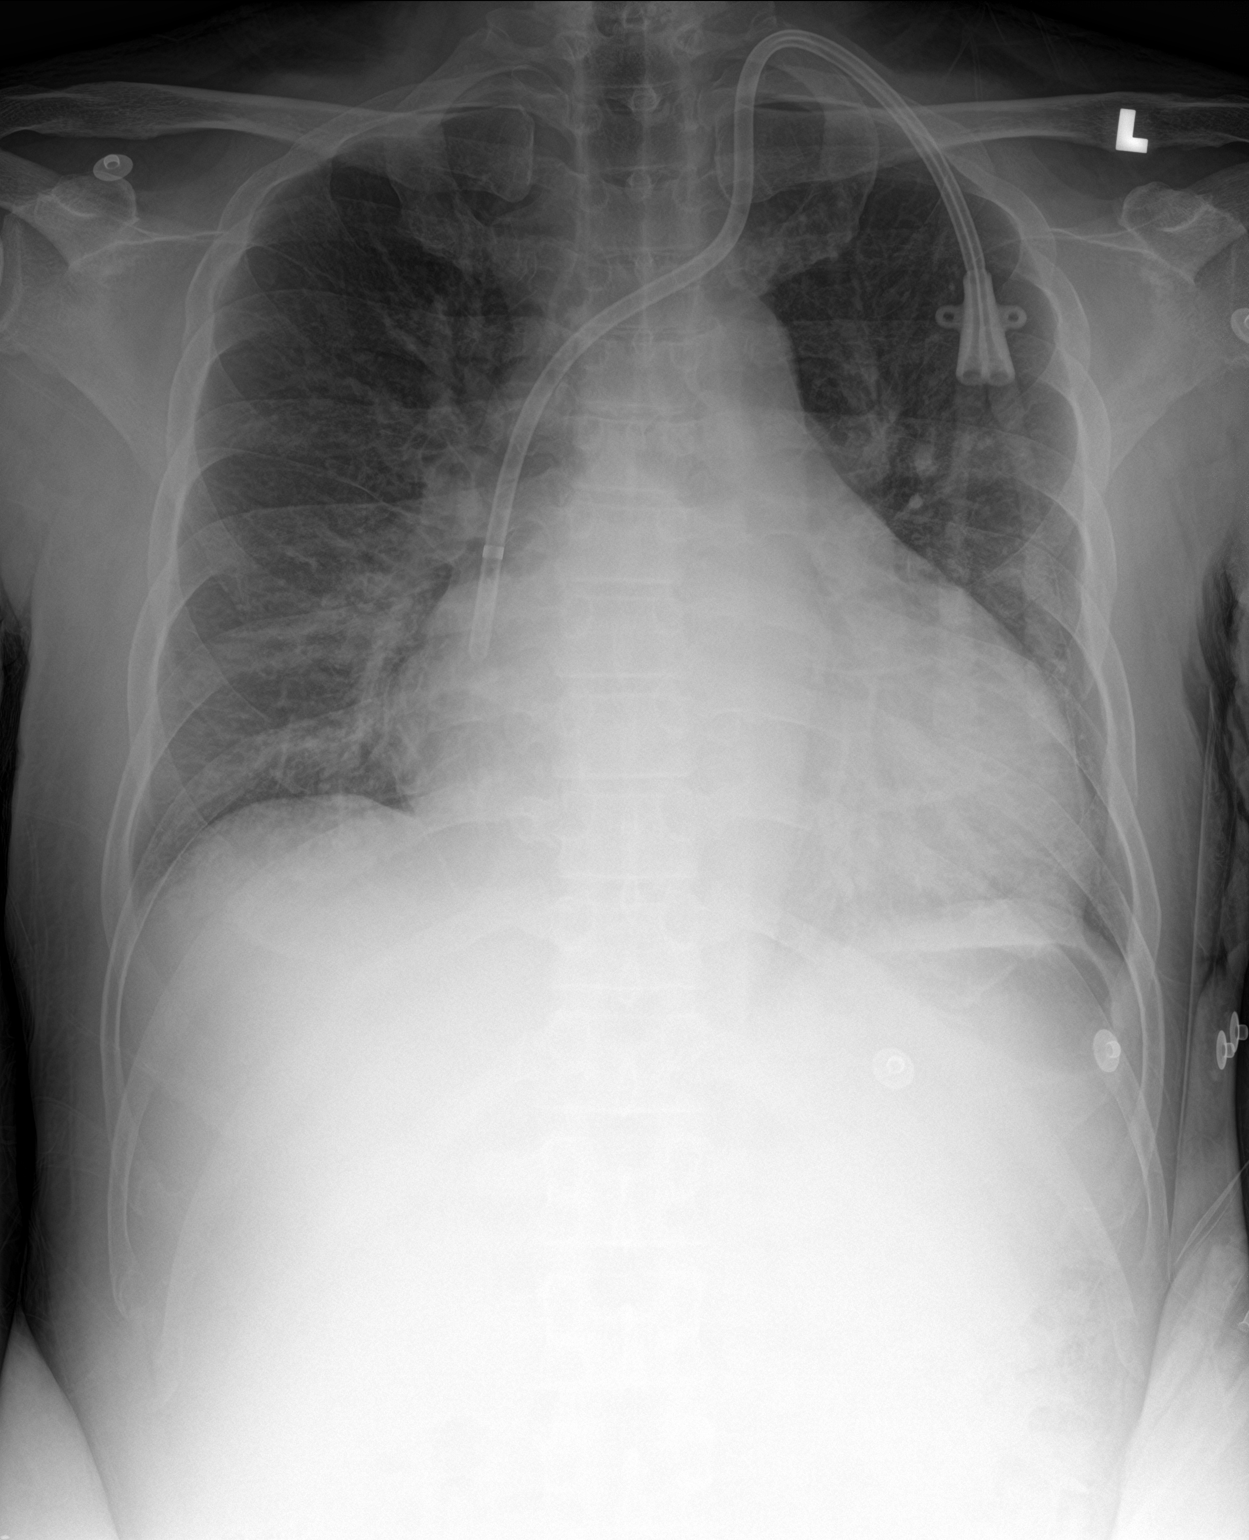

[2 of 2 positions shown; findings below may reference images not displayed]

FINDINGS: The lungs are symmetrically well expanded. No pneumothorax or
pleural effusion. Cardiomegaly is stable. Left internal jugular
hemodialysis catheter tip noted within the right atrium. The central
pulmonary vasculature is engorged, mild perihilar interstitial
infiltrate is seen and there is pulmonary vascular redistribution of
the lung apices in keeping with changes of mild volume overload or
early cardiogenic failure. No acute bone abnormality.
IMPRESSION: Stable cardiomegaly.  Mild cardiogenic failure.

## 2022-03-20 DIAGNOSIS — D509 Iron deficiency anemia, unspecified: Secondary | ICD-10-CM | POA: Insufficient documentation

## 2022-03-26 DIAGNOSIS — F411 Generalized anxiety disorder: Secondary | ICD-10-CM | POA: Insufficient documentation

## 2022-03-26 DIAGNOSIS — F419 Anxiety disorder, unspecified: Secondary | ICD-10-CM | POA: Insufficient documentation

## 2022-03-27 ENCOUNTER — Ambulatory Visit: Payer: Medicaid Other | Admitting: Podiatrist

## 2022-04-18 ENCOUNTER — Other Ambulatory Visit: Payer: Self-pay

## 2022-04-25 ENCOUNTER — Other Ambulatory Visit: Payer: Self-pay

## 2022-04-25 ENCOUNTER — Emergency Department (HOSPITAL_COMMUNITY): Payer: Medicaid Other

## 2022-04-25 ENCOUNTER — Emergency Department (HOSPITAL_COMMUNITY)
Admission: EM | Admit: 2022-04-25 | Discharge: 2022-04-26 | Disposition: A | Discharge status: Home / Self Care | Payer: Medicaid Other | Attending: Emergency Medicine | Admitting: Emergency Medicine

## 2022-04-25 ENCOUNTER — Encounter (HOSPITAL_COMMUNITY): Payer: Self-pay

## 2022-04-25 DIAGNOSIS — Z7984 Long term (current) use of oral hypoglycemic drugs: Secondary | ICD-10-CM | POA: Diagnosis not present

## 2022-04-25 DIAGNOSIS — R600 Localized edema: Secondary | ICD-10-CM | POA: Diagnosis not present

## 2022-04-25 DIAGNOSIS — E1122 Type 2 diabetes mellitus with diabetic chronic kidney disease: Secondary | ICD-10-CM | POA: Insufficient documentation

## 2022-04-25 DIAGNOSIS — D649 Anemia, unspecified: Secondary | ICD-10-CM | POA: Insufficient documentation

## 2022-04-25 DIAGNOSIS — I120 Hypertensive chronic kidney disease with stage 5 chronic kidney disease or end stage renal disease: Secondary | ICD-10-CM | POA: Diagnosis not present

## 2022-04-25 DIAGNOSIS — I10 Essential (primary) hypertension: Secondary | ICD-10-CM

## 2022-04-25 DIAGNOSIS — R109 Unspecified abdominal pain: Secondary | ICD-10-CM | POA: Diagnosis present

## 2022-04-25 DIAGNOSIS — N186 End stage renal disease: Secondary | ICD-10-CM | POA: Insufficient documentation

## 2022-04-25 DIAGNOSIS — R011 Cardiac murmur, unspecified: Secondary | ICD-10-CM | POA: Diagnosis not present

## 2022-04-25 DIAGNOSIS — Z79899 Other long term (current) drug therapy: Secondary | ICD-10-CM | POA: Diagnosis not present

## 2022-04-25 DIAGNOSIS — Z794 Long term (current) use of insulin: Secondary | ICD-10-CM | POA: Diagnosis not present

## 2022-04-25 DIAGNOSIS — Z992 Dependence on renal dialysis: Secondary | ICD-10-CM | POA: Insufficient documentation

## 2022-04-25 DIAGNOSIS — R609 Edema, unspecified: Secondary | ICD-10-CM

## 2022-04-25 LAB — COMPREHENSIVE METABOLIC PANEL
ALT: 28 U/L (ref 0–44)
AST: 34 U/L (ref 15–41)
Albumin: 3.3 g/dL — ABNORMAL LOW (ref 3.5–5.0)
Alkaline Phosphatase: 210 U/L — ABNORMAL HIGH (ref 38–126)
Anion gap: 15 (ref 5–15)
BUN: 89 mg/dL — ABNORMAL HIGH (ref 6–20)
CO2: 18 mmol/L — ABNORMAL LOW (ref 22–32)
Calcium: 7.4 mg/dL — ABNORMAL LOW (ref 8.9–10.3)
Chloride: 105 mmol/L (ref 98–111)
Creatinine, Ser: 13.52 mg/dL — ABNORMAL HIGH (ref 0.61–1.24)
GFR, Estimated: 4 mL/min — ABNORMAL LOW (ref >60)
Glucose, Bld: 163 mg/dL — ABNORMAL HIGH (ref 70–99)
Potassium: 4.6 mmol/L (ref 3.5–5.1)
Sodium: 138 mmol/L (ref 135–145)
Total Bilirubin: 0.4 mg/dL (ref 0.3–1.2)
Total Protein: 7.2 g/dL (ref 6.5–8.1)

## 2022-04-25 LAB — I-STAT BETA HCG BLOOD, ED (MC, WL, AP ONLY): I-stat hCG, quantitative: <5 m[IU]/mL (ref <5)

## 2022-04-25 LAB — CBC WITH DIFFERENTIAL/PLATELET
Abs Immature Granulocytes: 0.02 10*3/uL (ref 0.00–0.07)
Basophils Absolute: 0 10*3/uL (ref 0.0–0.1)
Basophils Relative: 1 %
Eosinophils Absolute: 0.4 10*3/uL (ref 0.0–0.5)
Eosinophils Relative: 7 %
HCT: 32.8 % — ABNORMAL LOW (ref 39.0–52.0)
Hemoglobin: 10.6 g/dL — ABNORMAL LOW (ref 13.0–17.0)
Immature Granulocytes: 0 %
Lymphocytes Relative: 16 %
Lymphs Abs: 1 10*3/uL (ref 0.7–4.0)
MCH: 29.5 pg (ref 26.0–34.0)
MCHC: 32.3 g/dL (ref 30.0–36.0)
MCV: 91.4 fL (ref 80.0–100.0)
Monocytes Absolute: 0.7 10*3/uL (ref 0.1–1.0)
Monocytes Relative: 11 %
Neutro Abs: 4 10*3/uL (ref 1.7–7.7)
Neutrophils Relative %: 65 %
Platelets: 243 10*3/uL (ref 150–400)
RBC: 3.59 MIL/uL — ABNORMAL LOW (ref 4.22–5.81)
RDW: 15.2 % (ref 11.5–15.5)
WBC: 6 10*3/uL (ref 4.0–10.5)
nRBC: 0 % (ref 0.0–0.2)

## 2022-04-25 LAB — I-STAT CHEM 8, ED
BUN: 91 mg/dL — ABNORMAL HIGH (ref 6–20)
Calcium, Ion: 0.93 mmol/L — ABNORMAL LOW (ref 1.15–1.40)
Chloride: 108 mmol/L (ref 98–111)
Creatinine, Ser: 14.4 mg/dL — ABNORMAL HIGH (ref 0.61–1.24)
Glucose, Bld: 152 mg/dL — ABNORMAL HIGH (ref 70–99)
HCT: 36 % — ABNORMAL LOW (ref 39.0–52.0)
Hemoglobin: 12.2 g/dL — ABNORMAL LOW (ref 13.0–17.0)
Potassium: 4.7 mmol/L (ref 3.5–5.1)
Sodium: 138 mmol/L (ref 135–145)
TCO2: 21 mmol/L — ABNORMAL LOW (ref 22–32)

## 2022-04-25 LAB — LIPASE, BLOOD: Lipase: 41 U/L (ref 11–51)

## 2022-04-25 LAB — TROPONIN I (HIGH SENSITIVITY): Troponin I (High Sensitivity): 154 ng/L (ref <18)

## 2022-04-25 LAB — MAGNESIUM: Magnesium: 2.5 mg/dL — ABNORMAL HIGH (ref 1.7–2.4)

## 2022-04-25 MED ORDER — AMLODIPINE BESYLATE 5 MG PO TABS
10.0000 mg | ORAL_TABLET | Freq: Every day | ORAL | Status: DC
  Administered 2022-04-25 – 2022-04-26 (×2): 10 mg via ORAL
  Filled 2022-04-25 (×2): qty 2

## 2022-04-25 MED ORDER — CHLORHEXIDINE GLUCONATE CLOTH 2 % EX PADS: 6.0000 | MEDICATED_PAD | Freq: Every day | CUTANEOUS | Status: DC

## 2022-04-25 MED ORDER — LINAGLIPTIN 5 MG PO TABS
5.0000 mg | ORAL_TABLET | Freq: Every day | ORAL | Status: DC
  Administered 2022-04-26: 5 mg via ORAL
  Filled 2022-04-25: qty 1

## 2022-04-25 MED ORDER — ESCITALOPRAM OXALATE 10 MG PO TABS
5.0000 mg | ORAL_TABLET | Freq: Every day | ORAL | Status: DC
  Administered 2022-04-26: 5 mg via ORAL
  Filled 2022-04-25: qty 1

## 2022-04-25 NOTE — ED Provider Notes (Signed)
[facility] EMERGENCY DEPARTMENT Provider Note   CSN: [id number] Arrival date & time: 04/25/22  2110     History  Chief Complaint  Patient presents with   Abdominal Pain    [name] is a 51 y.o. male.   Abdominal Pain  This patient is a 51 year old male, unfortunately he presents to the hospital today after not being able to get dialysis since last Saturday.  The patient is a known insulin requiring diabetic, he has hypertension, he has not been taking his hypertensive medications.  He also has a history of end-stage renal disease on dialysis.  He was recently visiting [place] and had set up for dialysis while he was gone, when he arrived back for dialysis traveling on Tuesday he was too late to get it and when he arrived to his dialysis center today they told him that he had been taken out of the system and could no longer get dialysis there.  They told him he needed to come to the hospital to get set up with dialysis again.  The patient does not know the name of his nephrologist nor does he know the name of the dialysis center he only knows the address.  The patient has been seen in the emergency department multiple times primarily for shortness of breath abdominal pain and has had a variety of emergency department visits over the year.  He is seen usually 10 or more times per month  He complains of severe leg swelling and some mild shortness of breath but also complains of hypertension, he states he has his medicines but cannot tell me why he has not taken them.    Home Medications Prior to Admission medications   Medication Sig Start Date End Date Taking? Authorizing Provider  amLODipine (NORVASC) 10 MG tablet take 1 tablet by mouth daily 02/27/22     escitalopram (LEXAPRO) 5 MG tablet Take 1 tablet (5 mg total) by mouth daily. 01/21/22 01/21/23  Ajibola, Ene A, NP  glucose blood test strip Use to check fasting blood sugar once daily. 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 Insulin  dependent 11/29/20   [name], MD  insulin aspart (NOVOLOG FLEXPEN) 100 UNIT/ML FlexPen Inject 5 Units into the skin 2 (two) times daily with a meal. Patient not taking: Reported on 09/28/2021 02/26/21   [name], [name], MD  insulin detemir (LEVEMIR FLEXTOUCH) 100 UNIT/ML FlexPen Inject 35 units into the skin once a day 02/27/22     insulin glargine (LANTUS) 100 UNIT/ML Solostar Pen Inject 10 Units into the skin daily. May substitute with any other long-acting formulary. Patient not taking: Reported on 09/28/2021 05/13/21   [name], MD  insulin glargine (LANTUS) 100 UNIT/ML Solostar Pen Inject 5 units into the skin twice a day with a meal 02/27/22     Insulin Pen Needle 32G X 4 MM MISC Use with insulin pens 02/26/21   [name], [name], MD  phenazopyridine (PYRIDIUM) 200 MG tablet Take 1 tablet (200 mg total) by mouth 3 (three) times daily. 03/02/22   Tacy Learn, PA-C  RENVELA 800 MG tablet Take 800-1,600 mg by mouth See admin instructions. Take 2 tablets (1600 mg) three times a day with meals and 1 tablet (800 mg) twice a day as needed with snacks Patient not taking: Reported on 11/06/2021 10/25/21   [provider]  sevelamer carbonate (RENVELA) 800 MG tablet Take 2 tablet by mouth three times a day with meals and 1 tablet twice daily as  needed with snacks 11/20/21     sitaGLIPtin (JANUVIA) 25 MG tablet take 1 tablet by mouth daily 02/27/22     triamcinolone cream (KENALOG) 0.1 % Apply 1 Application topically 2 (two) times daily. 01/06/22   Nyoka Lint, PA-C  gabapentin (NEURONTIN) 300 MG capsule Take 1 capsule (300 mg total) by mouth 3 (three) times daily. Patient not taking: Reported on 07/27/2019 07/29/18 08/30/19  [name], FNP  sildenafil (VIAGRA) 25 MG tablet Take 1 tablet (25 mg total) by mouth as needed for erectile dysfunction. for erectile dysfunction. Patient not taking: Reported on 08/11/2020 10/13/19 11/27/20  [name], FNP      Allergies     Sulfa antibiotics, Keflex [cephalexin], Other, and Bactrim [sulfamethoxazole-trimethoprim]    Review of Systems   Review of Systems  Cardiovascular:  Positive for leg swelling.  Gastrointestinal:  Positive for abdominal pain.  All other systems reviewed and are negative.   Physical Exam Updated Vital Signs BP (!) 193/108 (BP Location: Right Arm)   Pulse 86   Temp 97.9 F (36.6 C)   Resp 18   Ht 1.702 m (5\' 7" )   Wt 65.8 kg   SpO2 98%   BMI 22.71 kg/m  Physical Exam Vitals and nursing note reviewed.  Constitutional:      General: He is not in acute distress.    Appearance: He is well-developed.  HENT:     Head: Normocephalic and atraumatic.     Mouth/Throat:     Pharynx: No oropharyngeal exudate.  Eyes:     General: No scleral icterus.       Right eye: No discharge.        Left eye: No discharge.     Conjunctiva/sclera: Conjunctivae normal.     Pupils: Pupils are equal, round, and reactive to light.  Neck:     Thyroid: No thyromegaly.     Vascular: No JVD.  Cardiovascular:     Rate and Rhythm: Normal rate and regular rhythm.     Heart sounds: Murmur heard.     No friction rub. No gallop.  Pulmonary:     Effort: Pulmonary effort is normal. No respiratory distress.     Breath sounds: Normal breath sounds. No wheezing or rales.  Abdominal:     General: Bowel sounds are normal. There is no distension.     Palpations: Abdomen is soft. There is no mass.     Tenderness: There is no abdominal tenderness.  Musculoskeletal:        General: No tenderness. Normal range of motion.     Cervical back: Normal range of motion and neck supple.     Right lower leg: Edema present.     Left lower leg: Edema present.  Lymphadenopathy:     Cervical: No cervical adenopathy.  Skin:    General: Skin is warm and dry.     Findings: No erythema or rash.  Neurological:     General: No focal deficit present.     Mental Status: He is alert.     Coordination: Coordination normal.   Psychiatric:        Behavior: Behavior normal.     ED Results / Procedures / Treatments   Labs (all labs ordered are listed, but only abnormal results are displayed) Labs Reviewed  CBC WITH DIFFERENTIAL/PLATELET - Abnormal; Notable for the following components:      Result Value   RBC 3.59 (*)    Hemoglobin 10.6 (*)    HCT  32.8 (*)    All other components within normal limits  I-STAT CHEM 8, ED - Abnormal; Notable for the following components:   BUN 91 (*)    Creatinine, Ser 14.40 (*)    Glucose, Bld 152 (*)    Calcium, Ion 0.93 (*)    TCO2 21 (*)    Hemoglobin 12.2 (*)    HCT 36.0 (*)    All other components within normal limits  COMPREHENSIVE METABOLIC PANEL  MAGNESIUM  URINALYSIS, ROUTINE W REFLEX MICROSCOPIC  LIPASE, BLOOD  HEPATITIS B SURFACE ANTIGEN  HEPATITIS B SURFACE ANTIBODY, QUANTITATIVE  I-STAT BETA HCG BLOOD, ED ([facility], [facility], [facility] ONLY)  TROPONIN I (HIGH SENSITIVITY)    EKG EKG Interpretation  Date/Time:  Thursday April 25 2022 21:30:03 EST Ventricular Rate:  84 PR Interval:  148 QRS Duration: 148 QT Interval:  442 QTC Calculation: 522 R Axis:   136 Text Interpretation: Normal sinus rhythm Biatrial enlargement Right axis deviation Non-specific intra-ventricular conduction block Abnormal ECG When compared with ECG of 09-Mar-2022 01:10, PREVIOUS ECG IS PRESENT Confirmed by Noemi Chapel [phone]) on 04/25/2022 9:42:35 PM  Radiology DG Chest 1 View  Result Date: 04/25/2022 CLINICAL DATA:  Infection EXAM: CHEST  1 VIEW COMPARISON:  Chest x-ray 03/07/2022.  CT chest 10/09/2021. FINDINGS: Cardiac silhouette is markedly enlarged similar to prior exam. There is no focal lung infiltrate, pleural effusion or pneumothorax. Healed left-sided rib fractures unchanged. No acute fractures are seen. IMPRESSION: 1. No acute cardiopulmonary process. 2. Stable enlargement of the cardiac silhouette previously corresponding to cardiomegaly and pericardial effusion. Electronically  Signed   By: Ronney Asters M.D.   On: 04/25/2022 22:02    Procedures Procedures    Medications Ordered in ED Medications  amLODipine (NORVASC) tablet 10 mg (has no administration in time range)  escitalopram (LEXAPRO) tablet 5 mg (has no administration in time range)  linagliptin (TRADJENTA) tablet 5 mg (has no administration in time range)  Chlorhexidine Gluconate Cloth 2 % PADS 6 each (has no administration in time range)    ED Course/ Medical Decision Making/ A&P                           Medical Decision Making Risk Prescription drug management. Decision regarding hospitalization.   This patient presents to the ED for concern of swelling in his dialysis, this involves an extensive number of treatment options, and is a complaint that carries with it a high risk of complications and morbidity.  The differential diagnosis includes most likely fluid retention as he has missed dialysis, would consider DVT but this seems extremely unlikely given the patient's missing dialysis.  He is also severely hypertensive at 193/108 and has been noncompliant with his medicines, these will be administered in the ER   Co morbidities that complicate the patient evaluation  End-stage renal disease on dialysis Diabetes Hypertension   Additional history obtained:  Additional history obtained from electronic medical record External records from outside source obtained and reviewed including multiple ER visits The patient's last hemodialysis session in the hospital was June 29 but he had been getting it regularly here prior to that.   Lab Tests:  I Ordered, and personally interpreted labs.  The pertinent results include: Fully the potassium is just less than 5, creatinine is 14.4 BUN of 91 and normal sodium.  He has a normal liver function and a CBC which was unremarkable except for mild anemia.  This appears somewhat chronic.  Imaging Studies ordered:  I ordered imaging studies including  chest x-ray I independently visualized and interpreted imaging which showed marked cardiac enlargement, no significant pulmonary process I agree with the radiologist interpretation   Cardiac Monitoring: / EKG:  The patient was maintained on a cardiac monitor.  I personally viewed and interpreted the cardiac monitored which showed an underlying rhythm of: Normal sinus rhythm   Consultations Obtained:  I requested consultation with the nephrologist Dr. Posey Pronto,  and discussed lab and imaging findings as well as pertinent plan - they recommend: They will bring the patient up to dialysis after which time they can come back to the ER to be discharged.  If there is no reason to medically admit this patient.   Problem List / ED Course / Critical interventions / Medication management  Hypertension, the patient was given his home medication, labs were somewhat reassuring, he will need dialysis prior to being discharged due to his edema and hypertension I ordered medication including amlodipine for blood pressure Reevaluation of the patient after these medicines showed that the patient stayed the same I have reviewed the patients home medicines and have made adjustments as needed   Social Determinants of Health:  End-stage renal disease on dialysis   Test / Admission - Considered:  Consider admission, the patient will likely benefit from dialysis and then discharge, I do not see any reason to medically admit the patient at this time.         Final Clinical Impression(s) / ED Diagnoses Final diagnoses:  Primary hypertension  ESRD (end stage renal disease) ([place])  Peripheral edema    Rx / DC Orders ED Discharge Orders     None         Noemi Chapel, MD 04/25/22 2246

## 2022-04-25 NOTE — ED Provider Triage Note (Signed)
Emergency Medicine Provider Triage Evaluation Note  [name] , a 51 y.o. male  was evaluated in triage.  Patient reports that he has missed his last 2 dialysis sessions.  He last went on 6 Saturday.  He says that he had went out of town for session when he came back into town they told him here that they could not give him dialysis anymore at that center.  He says today started developing some tightness and swelling in his abdomen as well as both of his legs.  Denies any shortness of breath, chest pain, nausea, vomiting, fever, chills.  Review of Systems  Positive:  Negative:   Physical Exam  BP (!) 193/108 (BP Location: Right Arm)   Pulse 86   Temp 97.9 F (36.6 C)   Resp 18   Ht 5\' 7"  (1.702 m)   Wt 65.8 kg   SpO2 98%   BMI 22.71 kg/m  Gen:   Awake, no distress   Resp:  Normal effort  MSK:   Moves extremities without difficulty  Other:  Abd distended, nontender  Medical Decision Making  Medically screening exam initiated at 9:23 PM.  Appropriate orders placed.  [name] was informed that the remainder of the evaluation will be completed by another provider, this initial triage assessment does not replace that evaluation, and the importance of remaining in the ED until their evaluation is complete.      [name], [place] 04/25/22 2125

## 2022-04-25 NOTE — ED Triage Notes (Signed)
Pt states that he went to [facility] to visit family for a month and received dialysis there as scheduled.  When pt returned two days ago and went to the dialysis center here, they told him they had taken him out of their system and he could not get dialysis.  Pt has since missed Tuesday's and today's (Thurs) dialysis treatment.  When pt asked, pt was told he had to come to hospital to get set up with a different dialysis center.  Pt states his abdomen feels tight and legs are swelling.

## 2022-04-26 LAB — HEPATITIS B SURFACE ANTIGEN: Hepatitis B Surface Ag: NONREACTIVE

## 2022-04-26 LAB — TROPONIN I (HIGH SENSITIVITY): Troponin I (High Sensitivity): 147 ng/L (ref <18)

## 2022-04-26 MED ORDER — HEPARIN SODIUM (PORCINE) 1000 UNIT/ML DIALYSIS: 2000.0000 [IU] | INTRAMUSCULAR | Status: DC | PRN

## 2022-04-26 MED ORDER — LIDOCAINE HCL (PF) 1 % IJ SOLN: 5.0000 mL | INTRAMUSCULAR | Status: DC | PRN

## 2022-04-26 MED ORDER — PENTAFLUOROPROP-TETRAFLUOROETH EX AERO: 1.0000 | INHALATION_SPRAY | CUTANEOUS | Status: DC | PRN

## 2022-04-26 MED ORDER — LIDOCAINE-PRILOCAINE 2.5-2.5 % EX CREA: 1.0000 | TOPICAL_CREAM | CUTANEOUS | Status: DC | PRN

## 2022-04-26 NOTE — Discharge Instructions (Addendum)
Thank you for coming to [facility] Emergency Department. You were seen for shortness of breath, leg swelling. We did an exam, labs, and imaging, and these showed that you needed dialysis. You received dialysis overnight and felt improved in the morning. You discussed with social work and the renal navigator about finding a dialysis center. They will contact you about this. Please follow up with your primary care provider within 1 week. Please go to your new dialysis center for dialysis in 2 days.  Do not hesitate to return to the ED or call 911 if you experience: -Worsening symptoms -Chest pain, shortness of breath -Lightheadedness, passing out -Fevers/chills -Anything else that concerns you

## 2022-04-26 NOTE — Discharge Planning (Signed)
Pt no longer active with [facility] [phone] [address]) as confirmed with staff.  Staff states pt was receiving HD in the state of [place] and was discharged from their center.  Will need HD chair arranged.  RNCM consulted Renal Navigator for assistance.

## 2022-04-26 NOTE — Progress Notes (Signed)
Contacted by ED RN CM regarding pt needing a new HD clinic. Spoke to pt via phone [phone]). Pt states he was in [facility] and receiving HD at a [facility] clinic there. Pt has returned to [place] and now needs a clinic in [place]. Pt was active at [facility] in the past but pt was d/c from that clinic due to pt moving to [facility]. Referral submitted to [facility] admissions this afternoon for new clinic placement. Advised pt that navigator will have to f/u with pt next week once a response from [facility] is received. Case discussed with renal PA as well.   Melven Sartorius Renal Navigator [phone]

## 2022-04-26 NOTE — ED Provider Notes (Signed)
10:30 AM Assumed care of patient. For more details, please see note from same day.  In brief, this is a 51 y.o. male w/ ESRD on HD who presented after not getting dialysis since Saturday. He was evaluated by Dr. Sabra Heck and c/o leg swelling, mild SOB, and HTN. Was taken to dialysis overnight and presents back to ED for evaluation and likely DC.  BP (!) 169/84 (BP Location: Left Arm)   Pulse 79   Temp 98.9 F (37.2 C) (Oral)   Resp (!) 9   Ht 5\' 7"  (1.702 m)   Wt 61.6 kg   SpO2 95%   BMI 21.27 kg/m    ED Course:   Patient received dialysis with total UF removed 4800 cc.  BP now 169/84.  He is afebrile satting well on room air. He states he has no pain, feels better, no longer short of breath. He states he needs help finding a dialysis center. Patient is on his phone and very well-appearing. Consulted to social work for dialysis navigator.   Clinical Course as of 05/10/22 0954  Fri Apr 26, 2022  1324 Social work talking with the renal navigator. Patient had been discharged from his dialysis center. Navigator is made aware that patient already received dialysis today. Patient will be contacted by the renal navigator to set up o/p dialysis. Informed patient who understands that he needs to receive dialysis in 2 days and f/u with PCP.  [HN]    Clinical Course User Index [HN] Audley Hose, MD   ------------------------------- Cindee Lame, MD Emergency Medicine  This note was created using dictation software, which may contain spelling or grammatical errors.   Audley Hose, MD 05/10/22 [phone]

## 2022-04-26 NOTE — ED Notes (Signed)
Pt refused to lay on back to get accurate reading for vital signs.

## 2022-04-26 NOTE — Progress Notes (Signed)
   04/26/22 0837  Vitals  Temp 98.9 F (37.2 C)  Temp Source Oral  BP (!) 169/84  MAP (mmHg) 107  BP Location Left Arm  BP Method Automatic  Patient Position (if appropriate) Lying  Pulse Rate 79  Pulse Rate Source Monitor  Resp (!) 9  Oxygen Therapy  SpO2 95 %  O2 Device Nasal Cannula  O2 Flow Rate (L/min) 2 L/min  Patient Activity (if Appropriate) In bed  Pulse Oximetry Type Continuous   Received patient in bed to unit.  Alert and oriented.  Informed consent signed and in chart.   Treatment initiated: 0416  Treatment completed: 0800  Patient tolerated well.  Transported back to the room  Alert, without acute distress.  Hand-off given to patient's nurse.   Access used: AVF Access issues: NA  Total UF removed: 4815ml Medication(s) given: NA Post HD VS: see above Post HD weight: 61.6kg   [name]

## 2022-04-27 ENCOUNTER — Emergency Department (HOSPITAL_COMMUNITY)
Admission: EM | Admit: 2022-04-27 | Discharge: 2022-04-28 | Disposition: A | Discharge status: Home / Self Care | Payer: Medicaid Other | Source: Home / Self Care | Attending: Emergency Medicine | Admitting: Emergency Medicine

## 2022-04-27 ENCOUNTER — Emergency Department (HOSPITAL_COMMUNITY)
Admission: EM | Admit: 2022-04-27 | Discharge: 2022-04-27 | Disposition: A | Discharge status: Home / Self Care | Payer: Medicaid Other | Attending: Emergency Medicine | Admitting: Emergency Medicine

## 2022-04-27 ENCOUNTER — Encounter (HOSPITAL_COMMUNITY): Payer: Self-pay | Admitting: *Deleted

## 2022-04-27 ENCOUNTER — Other Ambulatory Visit: Payer: Self-pay

## 2022-04-27 DIAGNOSIS — Z79899 Other long term (current) drug therapy: Secondary | ICD-10-CM | POA: Insufficient documentation

## 2022-04-27 DIAGNOSIS — Z794 Long term (current) use of insulin: Secondary | ICD-10-CM | POA: Insufficient documentation

## 2022-04-27 DIAGNOSIS — R252 Cramp and spasm: Secondary | ICD-10-CM | POA: Diagnosis present

## 2022-04-27 DIAGNOSIS — I120 Hypertensive chronic kidney disease with stage 5 chronic kidney disease or end stage renal disease: Secondary | ICD-10-CM | POA: Insufficient documentation

## 2022-04-27 DIAGNOSIS — E8770 Fluid overload, unspecified: Secondary | ICD-10-CM | POA: Insufficient documentation

## 2022-04-27 DIAGNOSIS — Z992 Dependence on renal dialysis: Secondary | ICD-10-CM | POA: Insufficient documentation

## 2022-04-27 DIAGNOSIS — E1122 Type 2 diabetes mellitus with diabetic chronic kidney disease: Secondary | ICD-10-CM | POA: Insufficient documentation

## 2022-04-27 DIAGNOSIS — Z7982 Long term (current) use of aspirin: Secondary | ICD-10-CM | POA: Insufficient documentation

## 2022-04-27 DIAGNOSIS — N186 End stage renal disease: Secondary | ICD-10-CM | POA: Insufficient documentation

## 2022-04-27 DIAGNOSIS — Z9289 Personal history of other medical treatment: Secondary | ICD-10-CM

## 2022-04-27 DIAGNOSIS — R609 Edema, unspecified: Secondary | ICD-10-CM

## 2022-04-27 LAB — I-STAT CHEM 8, ED
BUN: 50 mg/dL — ABNORMAL HIGH (ref 6–20)
Calcium, Ion: 0.92 mmol/L — ABNORMAL LOW (ref 1.15–1.40)
Chloride: 101 mmol/L (ref 98–111)
Creatinine, Ser: 10.6 mg/dL — ABNORMAL HIGH (ref 0.61–1.24)
Glucose, Bld: 269 mg/dL — ABNORMAL HIGH (ref 70–99)
HCT: 36 % — ABNORMAL LOW (ref 39.0–52.0)
Hemoglobin: 12.2 g/dL — ABNORMAL LOW (ref 13.0–17.0)
Potassium: 3.9 mmol/L (ref 3.5–5.1)
Sodium: 139 mmol/L (ref 135–145)
TCO2: 28 mmol/L (ref 22–32)

## 2022-04-27 NOTE — ED Provider Triage Note (Signed)
Emergency Medicine Provider Triage Evaluation Note  [name] , a 51 y.o. male  was evaluated in triage.  Pt complains of request for dialysis. Reports normal schedule of Tuesday/Thursday/Saturday.  Last had dialysis yesterday at this hospital.  Is waiting to get into a dialysis center.  No other complaints or concerns..  Review of Systems  Positive:  Negative:   Physical Exam  BP (!) 188/105 (BP Location: Left Arm)   Pulse 94   Temp 98.2 F (36.8 C) (Oral)   Resp 16   SpO2 93%  Gen:   Awake, no distress   Resp:  Normal effort  MSK:   Moves extremities without difficulty  Other:    Medical Decision Making  Medically screening exam initiated at 11:14 PM.  Appropriate orders placed.  [name] was informed that the remainder of the evaluation will be completed by another provider, this initial triage assessment does not replace that evaluation, and the importance of remaining in the ED until their evaluation is complete.     Tacy Learn, PA-C 04/27/22 2324

## 2022-04-27 NOTE — ED Provider Notes (Signed)
[facility] Emergency Department Provider Note MRN:  [id number]  Arrival date & time: 04/27/22     Chief Complaint   Leg Pain   History of Present Illness   [name] is a 51 y.o. year-old male presents to the ED with chief complaint of bilateral leg cramps.  He states that this happened after being dialyzed.  He states that this happens occasionally.  He does not take anything for the symptoms.  He denies shortness of breath.  He denies any other associated symptoms.  History provided by patient.   Review of Systems  Pertinent positive and negative review of systems noted in HPI.    Physical Exam   Vitals:   04/27/22 0020  BP: (!) 184/105  Pulse: 87  Resp: 18  Temp: 98.1 F (36.7 C)  SpO2: 96%    CONSTITUTIONAL:  well-appearing, NAD NEURO:  Alert and oriented x 3, CN 3-12 grossly intact EYES:  eyes equal and reactive ENT/NECK:  Supple, no stridor  CARDIO:  normal rate, appears well-perfused  PULM:  No respiratory distress,  GI/GU:  non-distended,  MSK/SPINE:  No gross deformities, no edema, moves all extremities  SKIN:  no rash, atraumatic   *Additional and/or pertinent findings included in MDM below  Diagnostic and Interventional Summary    EKG Interpretation  Date/Time:    Ventricular Rate:    PR Interval:    QRS Duration:   QT Interval:    QTC Calculation:   R Axis:     Text Interpretation:         Labs Reviewed  I-STAT CHEM 8, ED - Abnormal; Notable for the following components:      Result Value   BUN 50 (*)    Creatinine, Ser 10.60 (*)    Glucose, Bld 269 (*)    Calcium, Ion 0.92 (*)    Hemoglobin 12.2 (*)    HCT 36.0 (*)    All other components within normal limits    No orders to display    Medications - No data to display   Procedures  /  Critical Care Procedures  ED Course and Medical Decision Making  I have reviewed the triage vital signs, the nursing notes, and pertinent available records from  the EMR.  Social Determinants Affecting Complexity of Care: Patient has no clinically significant social determinants affecting this chief complaint..   ED Course:    Medical Decision Making Patient here with bilateral leg cramps after completing dialysis.  His legs are not erythematous, there is no significant pitting edema.  He is not hypoxic.  No evidence of respiratory distress.  Patient encouraged to follow-up with PCP.  Patient encouraged to continue with his regular dialysis.     Consultants: No consultations were needed in caring for this patient.   Treatment and Plan: Emergency department workup does not suggest an emergent condition requiring admission or immediate intervention beyond  what has been performed at this time. The patient is safe for discharge and has  been instructed to return immediately for worsening symptoms, change in  symptoms or any other concerns    Final Clinical Impressions(s) / ED Diagnoses     ICD-10-CM   1. Leg cramps  R25.2       ED Discharge Orders     None         Discharge Instructions Discussed with and Provided to Patient:   Discharge Instructions   None      Montine Circle, PA-C  04/27/22 0246    Quintella Reichert, MD 04/27/22 [phone]

## 2022-04-27 NOTE — ED Triage Notes (Signed)
Pt reports cramping in bilateral legs since he was dialyzed yesterday.

## 2022-04-27 NOTE — ED Triage Notes (Signed)
The pt is here for he reports to be dialyzed  he  had dialysis the 8th  he is not sob fistula rt arm

## 2022-04-28 LAB — BASIC METABOLIC PANEL
Anion gap: 13 (ref 5–15)
BUN: 60 mg/dL — ABNORMAL HIGH (ref 6–20)
CO2: 25 mmol/L (ref 22–32)
Calcium: 7.1 mg/dL — ABNORMAL LOW (ref 8.9–10.3)
Chloride: 100 mmol/L (ref 98–111)
Creatinine, Ser: 10.43 mg/dL — ABNORMAL HIGH (ref 0.61–1.24)
GFR, Estimated: 6 mL/min — ABNORMAL LOW (ref >60)
Glucose, Bld: 184 mg/dL — ABNORMAL HIGH (ref 70–99)
Potassium: 3.8 mmol/L (ref 3.5–5.1)
Sodium: 138 mmol/L (ref 135–145)

## 2022-04-28 LAB — I-STAT CHEM 8, ED
BUN: 56 mg/dL — ABNORMAL HIGH (ref 6–20)
Calcium, Ion: 0.88 mmol/L — CL (ref 1.15–1.40)
Chloride: 101 mmol/L (ref 98–111)
Creatinine, Ser: 11.2 mg/dL — ABNORMAL HIGH (ref 0.61–1.24)
Glucose, Bld: 179 mg/dL — ABNORMAL HIGH (ref 70–99)
HCT: 38 % — ABNORMAL LOW (ref 39.0–52.0)
Hemoglobin: 12.9 g/dL — ABNORMAL LOW (ref 13.0–17.0)
Potassium: 3.9 mmol/L (ref 3.5–5.1)
Sodium: 138 mmol/L (ref 135–145)
TCO2: 25 mmol/L (ref 22–32)

## 2022-04-28 LAB — CBC
HCT: 34.8 % — ABNORMAL LOW (ref 39.0–52.0)
Hemoglobin: 11.2 g/dL — ABNORMAL LOW (ref 13.0–17.0)
MCH: 29.4 pg (ref 26.0–34.0)
MCHC: 32.2 g/dL (ref 30.0–36.0)
MCV: 91.3 fL (ref 80.0–100.0)
Platelets: 268 10*3/uL (ref 150–400)
RBC: 3.81 MIL/uL — ABNORMAL LOW (ref 4.22–5.81)
RDW: 15.2 % (ref 11.5–15.5)
WBC: 6.2 10*3/uL (ref 4.0–10.5)
nRBC: 0 % (ref 0.0–0.2)

## 2022-04-28 NOTE — Discharge Instructions (Addendum)
You were seen in the emergency department for possible dialysis.  As we discussed, your labs actually look fairly good today. You don't need emergent dialysis. You need to call the renal coordinator to get set up and schedule dialysis for you. Here is the number: Melven Sartorius, Renal Navigator, [phone]  Continue to monitor how you're doing and return to the ER for new or worsening symptoms.

## 2022-04-28 NOTE — ED Provider Notes (Signed)
[facility] EMERGENCY DEPARTMENT Provider Note   CSN: [id number] Arrival date & time: 04/27/22  2237     History  Chief Complaint  Patient presents with   wnats dialysis    [name] is a 51 y.o. male who presents to the emergency department requesting dialysis. Pt states he last got dialysis on 12/8 at this hospital, normal schedule T/R/S. Is waiting to get into another dialysis center after being let go by his last one due to missed appointments after he was out of town with family. Complaining of some swelling in his bilateral feet. No CP or SOB.   HPI     Home Medications Prior to Admission medications   Medication Sig Start Date End Date Taking? Authorizing Provider  amLODipine (NORVASC) 10 MG tablet take 1 tablet by mouth daily 02/27/22     ASPIRIN LOW DOSE 81 MG tablet Take 81 mg by mouth daily. Patient not taking: Reported on 04/25/2022 03/26/22   [provider]  calcium acetate (PHOSLO) 667 MG capsule Take 2,001 mg by mouth 3 (three) times daily. 03/26/22   [provider]  escitalopram (LEXAPRO) 5 MG tablet Take 1 tablet (5 mg total) by mouth daily. 01/21/22 01/21/23  Ajibola, Ene A, NP  glucose blood test strip Use to check fasting blood sugar once daily. 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 02/26/21   [name], [name], MD  insulin detemir (LEVEMIR FLEXTOUCH) 100 UNIT/ML FlexPen Inject 35 units into the skin once a day 02/27/22     insulin glargine (LANTUS) 100 UNIT/ML Solostar Pen Inject 10 Units into the skin daily. May substitute with any other long-acting formulary. 05/13/21   [name], MD  insulin glargine (LANTUS) 100 UNIT/ML Solostar Pen Inject 5 units into the skin twice a day with a meal 02/27/22     Insulin Pen Needle 32G X 4 MM MISC Use with insulin pens 02/26/21    [name], [name], MD  phenazopyridine (PYRIDIUM) 200 MG tablet Take 1 tablet (200 mg total) by mouth 3 (three) times daily. 03/02/22   Tacy Learn, PA-C  PROTONIX 40 MG tablet Take 40 mg by mouth daily. 03/19/22   [provider]  sevelamer carbonate (RENVELA) 800 MG tablet Take 2 tablet by mouth three times a day with meals and 1 tablet twice daily as needed with snacks Patient not taking: Reported on 04/26/2022 11/20/21     sitaGLIPtin (JANUVIA) 25 MG tablet take 1 tablet by mouth daily 02/27/22     sodium bicarbonate 650 MG tablet Take 650 mg by mouth daily. 03/19/22   [provider]  triamcinolone cream (KENALOG) 0.1 % Apply 1 Application topically 2 (two) times daily. 01/06/22   Nyoka Lint, PA-C  gabapentin (NEURONTIN) 300 MG capsule Take 1 capsule (300 mg total) by mouth 3 (three) times daily. Patient not taking: Reported on 07/27/2019 07/29/18 08/30/19  [name], FNP  sildenafil (VIAGRA) 25 MG tablet Take 1 tablet (25 mg total) by mouth as needed for erectile dysfunction. for erectile dysfunction. Patient not taking: Reported on 08/11/2020 10/13/19 11/27/20  [name], FNP      Allergies    Sulfa antibiotics, Keflex [cephalexin], Other, and Bactrim [sulfamethoxazole-trimethoprim]    Review of Systems   Review of Systems  Respiratory:  Negative for shortness of breath.  Cardiovascular:  Positive for leg swelling. Negative for chest pain and palpitations.  Gastrointestinal:  Negative for abdominal pain, nausea and vomiting.  All other systems reviewed and are negative.   Physical Exam Updated Vital Signs BP (!) 188/105 (BP Location: Left Arm)   Pulse 94   Temp 98.2 F (36.8 C) (Oral)   Resp 16   Ht 5\' 7"  (1.702 m)   Wt 61.6 kg   SpO2 93%   BMI 21.27 kg/m  Physical Exam Vitals and nursing note reviewed.  Constitutional:      Appearance: Normal appearance.  HENT:     Head: Normocephalic and atraumatic.  Eyes:     Conjunctiva/sclera:  Conjunctivae normal.  Cardiovascular:     Rate and Rhythm: Normal rate and regular rhythm.     Comments: Bilateral nonpitting edema to the ankles Pulmonary:     Effort: Pulmonary effort is normal. No respiratory distress.     Breath sounds: Normal breath sounds.  Abdominal:     General: There is no distension.     Palpations: Abdomen is soft.     Tenderness: There is no abdominal tenderness.  Skin:    General: Skin is warm and dry.  Neurological:     General: No focal deficit present.     Mental Status: He is alert.     ED Results / Procedures / Treatments   Labs (all labs ordered are listed, but only abnormal results are displayed) Labs Reviewed  CBC - Abnormal; Notable for the following components:      Result Value   RBC 3.81 (*)    Hemoglobin 11.2 (*)    HCT 34.8 (*)    All other components within normal limits  BASIC METABOLIC PANEL - Abnormal; Notable for the following components:   Glucose, Bld 184 (*)    BUN 60 (*)    Creatinine, Ser 10.43 (*)    Calcium 7.1 (*)    GFR, Estimated 6 (*)    All other components within normal limits  I-STAT CHEM 8, ED - Abnormal; Notable for the following components:   BUN 56 (*)    Creatinine, Ser 11.20 (*)    Glucose, Bld 179 (*)    Calcium, Ion 0.88 (*)    Hemoglobin 12.9 (*)    HCT 38.0 (*)    All other components within normal limits    EKG EKG Interpretation  Date/Time:  Sunday April 28 2022 01:15:50 EST Ventricular Rate:  89 PR Interval:  160 QRS Duration: 152 QT Interval:  442 QTC Calculation: 537 R Axis:   158 Text Interpretation: Normal sinus rhythm Biatrial enlargement Right axis deviation Left bundle branch block Abnormal ECG Confirmed by Quintella Reichert [phone]) on 04/28/2022 1:28:19 AM  Radiology No results found.  Procedures Procedures    Medications Ordered in ED Medications - No data to display  ED Course/ Medical Decision Making/ A&P Clinical Course as of 04/28/22 0310  Sun Apr 28, 2022   0146 I-stat chem 8, ED (not at [facility], [facility] or [facility])(!!) Normal potassium. Elevated BUN/Cr compared to prior two days ago. Critically low calcium, will await follow up BMP to confirm. [LR]    Clinical Course User Index [LR] Lashae Wollenberg, Cecille Aver, PA-C                           Medical Decision Making Amount and/or Complexity of Data Reviewed Labs: ordered. Decision-making details documented in ED Course.  This patient is  a 51 y.o. male  who presents to the ED for concern of needing dialysis.   Past Medical History / Co-morbidities: T2DM, ESRD on dialysis, HLD, HTN, homelessness  Additional history: Chart reviewed. Pertinent results include: Seen in ER several times in the past several days. Last got dialysis on 12/8. Renal navigator was contacted by case management in hopes to schedule him at new dialysis clinic.   Physical Exam: Physical exam performed. The pertinent findings include: Hypertensive, but otherwise well appearing. Bilateral nonpitting edema to the ankles. No complaints.   Lab Tests/Imaging studies: I personally interpreted labs/imaging and the pertinent results include:  No leukocytosis. Hemoglobin, electrolytes, and kidney function all relatively at patient's baseline.   Cardiac monitoring: EKG obtained and interpreted by my attending physician which shows: Normal sinus rhythm with left bundle branch block   Disposition: After consideration of the diagnostic results and the patients response to treatment, I feel that emergency department workup does not suggest an emergent condition requiring admission or immediate intervention beyond what has been performed at this time. The plan is: discharge to home with instructions to follow up with renal navigator to set up with new dialysis clinic. Lab work overall reassuring. Not requiring emergent dialysis. The patient is safe for discharge and has been instructed to return immediately for worsening symptoms, change in symptoms or any  other concerns.  I discussed this case with my attending physician Dr. Ralene Bathe who cosigned this note including patient's presenting symptoms, physical exam, and planned diagnostics and interventions. Attending physician stated agreement with plan or made changes to plan which were implemented.   Final Clinical Impression(s) / ED Diagnoses Final diagnoses:  Fluid retention  History of renal dialysis    Rx / DC Orders ED Discharge Orders     None      Portions of this report may have been transcribed using voice recognition software. Every effort was made to ensure accuracy; however, inadvertent computerized transcription errors may be present.    Estill Cotta 04/28/22 0310    Quintella Reichert, MD 04/28/22 [phone]

## 2022-04-29 ENCOUNTER — Ambulatory Visit: Payer: Medicaid Other | Admitting: Podiatry

## 2022-04-29 LAB — HEPATITIS B SURFACE ANTIBODY, QUANTITATIVE: Hep B S AB Quant (Post): <3.1 m[IU]/mL — ABNORMAL LOW (ref >9.9)

## 2022-04-30 ENCOUNTER — Emergency Department (HOSPITAL_COMMUNITY)
Admission: EM | Admit: 2022-04-30 | Discharge: 2022-05-01 | Discharge status: Against Medical Advice | Payer: Medicaid Other | Attending: Emergency Medicine | Admitting: Emergency Medicine

## 2022-04-30 ENCOUNTER — Encounter (HOSPITAL_COMMUNITY): Payer: Self-pay

## 2022-04-30 DIAGNOSIS — Z5321 Procedure and treatment not carried out due to patient leaving prior to being seen by health care provider: Secondary | ICD-10-CM | POA: Insufficient documentation

## 2022-04-30 DIAGNOSIS — R7309 Other abnormal glucose: Secondary | ICD-10-CM | POA: Diagnosis not present

## 2022-04-30 DIAGNOSIS — Z992 Dependence on renal dialysis: Secondary | ICD-10-CM | POA: Insufficient documentation

## 2022-04-30 DIAGNOSIS — R112 Nausea with vomiting, unspecified: Secondary | ICD-10-CM | POA: Diagnosis present

## 2022-04-30 LAB — COMPREHENSIVE METABOLIC PANEL
ALT: 35 U/L (ref 0–44)
AST: 36 U/L (ref 15–41)
Albumin: 3.2 g/dL — ABNORMAL LOW (ref 3.5–5.0)
Alkaline Phosphatase: 226 U/L — ABNORMAL HIGH (ref 38–126)
Anion gap: 17 — ABNORMAL HIGH (ref 5–15)
BUN: 83 mg/dL — ABNORMAL HIGH (ref 6–20)
CO2: 16 mmol/L — ABNORMAL LOW (ref 22–32)
Calcium: 7.1 mg/dL — ABNORMAL LOW (ref 8.9–10.3)
Chloride: 99 mmol/L (ref 98–111)
Creatinine, Ser: 12.59 mg/dL — ABNORMAL HIGH (ref 0.61–1.24)
GFR, Estimated: 4 mL/min — ABNORMAL LOW (ref >60)
Glucose, Bld: 136 mg/dL — ABNORMAL HIGH (ref 70–99)
Potassium: 4.8 mmol/L (ref 3.5–5.1)
Sodium: 132 mmol/L — ABNORMAL LOW (ref 135–145)
Total Bilirubin: 0.6 mg/dL (ref 0.3–1.2)
Total Protein: 7.3 g/dL (ref 6.5–8.1)

## 2022-04-30 LAB — CBC
HCT: 33.3 % — ABNORMAL LOW (ref 39.0–52.0)
Hemoglobin: 10.7 g/dL — ABNORMAL LOW (ref 13.0–17.0)
MCH: 28.7 pg (ref 26.0–34.0)
MCHC: 32.1 g/dL (ref 30.0–36.0)
MCV: 89.3 fL (ref 80.0–100.0)
Platelets: 267 10*3/uL (ref 150–400)
RBC: 3.73 MIL/uL — ABNORMAL LOW (ref 4.22–5.81)
RDW: 14.8 % (ref 11.5–15.5)
WBC: 6.3 10*3/uL (ref 4.0–10.5)
nRBC: 0 % (ref 0.0–0.2)

## 2022-04-30 LAB — CBG MONITORING, ED: Glucose-Capillary: 107 mg/dL — ABNORMAL HIGH (ref 70–99)

## 2022-04-30 LAB — LIPASE, BLOOD: Lipase: 33 U/L (ref 11–51)

## 2022-04-30 NOTE — ED Triage Notes (Signed)
Pt comes via [facility], has been out of town for a month and returned and gt dialysis on tue thur sat and has not received dialysis since Fri. Now c/o of n/v for the past 2 days.

## 2022-04-30 NOTE — Progress Notes (Addendum)
Late note Entry:  Pt has been accepted at [facility]) on TTS 6:40 am chair time. Pt can start on Thursday, Dec 14. Pt will need to arrive at 6:20 am. Pt will also need to arrive tomorrow at noon at clinic to complete paperwork tomorrow and have first treatment on Thursday. Called pt's cell number and voicemail left requesting a return call to go over arrangements. Will provide update to renal satff as well.   Melven Sartorius Renal Navigator [phone]  Addendum at 2:34 pm: Received a return call from pt. Advised pt of out-pt HD arrangements and that clinic can start pt on Thursday. Pt feels he may need HD before then. Advised pt he would have to go to a local ED for assessment if that proves to be the case.

## 2022-05-01 ENCOUNTER — Encounter (HOSPITAL_COMMUNITY): Payer: Self-pay | Admitting: Emergency Medicine

## 2022-05-01 ENCOUNTER — Emergency Department (HOSPITAL_COMMUNITY)
Admission: EM | Admit: 2022-05-01 | Discharge: 2022-05-02 | Discharge status: Against Medical Advice | Payer: Medicaid Other | Attending: Emergency Medicine | Admitting: Emergency Medicine

## 2022-05-01 DIAGNOSIS — R0602 Shortness of breath: Secondary | ICD-10-CM | POA: Diagnosis not present

## 2022-05-01 DIAGNOSIS — I509 Heart failure, unspecified: Secondary | ICD-10-CM | POA: Diagnosis not present

## 2022-05-01 DIAGNOSIS — Z5321 Procedure and treatment not carried out due to patient leaving prior to being seen by health care provider: Secondary | ICD-10-CM | POA: Insufficient documentation

## 2022-05-01 DIAGNOSIS — R059 Cough, unspecified: Secondary | ICD-10-CM | POA: Insufficient documentation

## 2022-05-01 DIAGNOSIS — I110 Hypertensive heart disease with heart failure: Secondary | ICD-10-CM | POA: Diagnosis not present

## 2022-05-01 DIAGNOSIS — Z992 Dependence on renal dialysis: Secondary | ICD-10-CM | POA: Insufficient documentation

## 2022-05-01 LAB — CBC WITH DIFFERENTIAL/PLATELET
Abs Immature Granulocytes: 0.05 10*3/uL (ref 0.00–0.07)
Basophils Absolute: 0 10*3/uL (ref 0.0–0.1)
Basophils Relative: 1 %
Eosinophils Absolute: 0.1 10*3/uL (ref 0.0–0.5)
Eosinophils Relative: 1 %
HCT: 34.3 % — ABNORMAL LOW (ref 39.0–52.0)
Hemoglobin: 11.2 g/dL — ABNORMAL LOW (ref 13.0–17.0)
Immature Granulocytes: 1 %
Lymphocytes Relative: 8 %
Lymphs Abs: 0.6 10*3/uL — ABNORMAL LOW (ref 0.7–4.0)
MCH: 29 pg (ref 26.0–34.0)
MCHC: 32.7 g/dL (ref 30.0–36.0)
MCV: 88.9 fL (ref 80.0–100.0)
Monocytes Absolute: 0.8 10*3/uL (ref 0.1–1.0)
Monocytes Relative: 10 %
Neutro Abs: 6.2 10*3/uL (ref 1.7–7.7)
Neutrophils Relative %: 79 %
Platelets: 281 10*3/uL (ref 150–400)
RBC: 3.86 MIL/uL — ABNORMAL LOW (ref 4.22–5.81)
RDW: 14.9 % (ref 11.5–15.5)
WBC: 7.8 10*3/uL (ref 4.0–10.5)
nRBC: 0 % (ref 0.0–0.2)

## 2022-05-01 LAB — BASIC METABOLIC PANEL
Anion gap: 20 — ABNORMAL HIGH (ref 5–15)
BUN: 95 mg/dL — ABNORMAL HIGH (ref 6–20)
CO2: 15 mmol/L — ABNORMAL LOW (ref 22–32)
Calcium: 7.6 mg/dL — ABNORMAL LOW (ref 8.9–10.3)
Chloride: 100 mmol/L (ref 98–111)
Creatinine, Ser: 14.02 mg/dL — ABNORMAL HIGH (ref 0.61–1.24)
GFR, Estimated: 4 mL/min — ABNORMAL LOW (ref >60)
Glucose, Bld: 129 mg/dL — ABNORMAL HIGH (ref 70–99)
Potassium: 4.8 mmol/L (ref 3.5–5.1)
Sodium: 135 mmol/L (ref 135–145)

## 2022-05-01 LAB — I-STAT CHEM 8, ED
BUN: 94 mg/dL — ABNORMAL HIGH (ref 6–20)
Calcium, Ion: 0.84 mmol/L — CL (ref 1.15–1.40)
Chloride: 105 mmol/L (ref 98–111)
Creatinine, Ser: 17.3 mg/dL — ABNORMAL HIGH (ref 0.61–1.24)
Glucose, Bld: 128 mg/dL — ABNORMAL HIGH (ref 70–99)
HCT: 34 % — ABNORMAL LOW (ref 39.0–52.0)
Hemoglobin: 11.6 g/dL — ABNORMAL LOW (ref 13.0–17.0)
Potassium: 4.8 mmol/L (ref 3.5–5.1)
Sodium: 132 mmol/L — ABNORMAL LOW (ref 135–145)
TCO2: 15 mmol/L — ABNORMAL LOW (ref 22–32)

## 2022-05-01 NOTE — ED Provider Triage Note (Signed)
Emergency Medicine Provider Triage Evaluation Note  [name] , a 51 y.o. male  was evaluated in triage.  Pt complains of cough and shortness of breath.  He is a dialysis patient and was last dialyzed Friday at the hospital.  [place]  Patient was at ED yesterday but eloped.  Denies chest pain, shortness of breath.    Review of Systems  Positive: See above Negative: See above  Physical Exam  There were no vitals taken for this visit. Gen:   Awake, no distress   Resp:  Normal effort  MSK:   Moves extremities without difficulty  Other:    Medical Decision Making  Medically screening exam initiated at 9:40 PM.  Appropriate orders placed.  [name] was informed that the remainder of the evaluation will be completed by another provider, this initial triage assessment does not replace that evaluation, and the importance of remaining in the ED until their evaluation is complete.     Theressa Stamps R, [place] 05/01/22 2148

## 2022-05-01 NOTE — ED Notes (Signed)
Arm restriction arm band applied

## 2022-05-01 NOTE — ED Notes (Signed)
Pt didn't respond to be be roomed x2

## 2022-05-01 NOTE — ED Notes (Signed)
Pt not answering to be roomed .  

## 2022-05-01 NOTE — ED Triage Notes (Addendum)
Diaysis patient last got it on Saturday. [place]. Was seen here yesterday. VSS WNL. Complains of cough and SOB. Hx of CHF and hypertension. CBG was 179 per EMS

## 2022-05-02 ENCOUNTER — Emergency Department (HOSPITAL_COMMUNITY)
Admission: EM | Admit: 2022-05-02 | Discharge: 2022-05-03 | Disposition: A | Discharge status: Home / Self Care | Payer: Medicaid Other | Attending: Emergency Medicine | Admitting: Emergency Medicine

## 2022-05-02 DIAGNOSIS — Z992 Dependence on renal dialysis: Secondary | ICD-10-CM | POA: Insufficient documentation

## 2022-05-02 DIAGNOSIS — Z794 Long term (current) use of insulin: Secondary | ICD-10-CM | POA: Diagnosis not present

## 2022-05-02 DIAGNOSIS — N186 End stage renal disease: Secondary | ICD-10-CM | POA: Insufficient documentation

## 2022-05-02 DIAGNOSIS — E1122 Type 2 diabetes mellitus with diabetic chronic kidney disease: Secondary | ICD-10-CM | POA: Diagnosis not present

## 2022-05-02 DIAGNOSIS — Z7984 Long term (current) use of oral hypoglycemic drugs: Secondary | ICD-10-CM | POA: Diagnosis not present

## 2022-05-02 DIAGNOSIS — R0602 Shortness of breath: Secondary | ICD-10-CM | POA: Diagnosis present

## 2022-05-02 DIAGNOSIS — Z5900 Homelessness unspecified: Secondary | ICD-10-CM | POA: Insufficient documentation

## 2022-05-02 DIAGNOSIS — Z7982 Long term (current) use of aspirin: Secondary | ICD-10-CM | POA: Diagnosis not present

## 2022-05-02 NOTE — ED Notes (Signed)
Called patient for a vital recheck patient didn't answer

## 2022-05-02 NOTE — ED Provider Triage Note (Signed)
Emergency Medicine Provider Triage Evaluation Note  [name] , a 51 y.o. male  was evaluated in triage.  Patient was at [facility].  Please was called to remove him from the property.  He then complained of medical emergency to be brought to the emergency room.  On my examination he does not have any specific complaint.  Was seen last night had blood work drawn at that time.  Last dialyzed on Friday.  He is resting comfortably with his eyes closed.  Review of Systems  Positive: As above Negative: As above  Physical Exam  There were no vitals taken for this visit. Gen:   Awake, no distress   Resp:  Normal effort  MSK:   Moves extremities without difficulty  Other:    Medical Decision Making  Medically screening exam initiated at 4:37 PM.  Appropriate orders placed.  [name] was informed that the remainder of the evaluation will be completed by another provider, this initial triage assessment does not replace that evaluation, and the importance of remaining in the ED until their evaluation is complete.     Evlyn Courier, PA-C 05/02/22 [phone]

## 2022-05-02 NOTE — ED Triage Notes (Signed)
Patient BIB [facility] from [facility]. [facility] had called police to remove him from the property at which point he stated he had a "medical emergency" which was that he had not had dialysis since last Friday.    BP 160/84 HR 84 SpO2 85% - 3L [place] -> 98% RR 16

## 2022-05-02 NOTE — ED Notes (Signed)
Patient called for vitals x3 

## 2022-05-03 ENCOUNTER — Other Ambulatory Visit: Payer: Self-pay

## 2022-05-03 ENCOUNTER — Emergency Department (HOSPITAL_COMMUNITY): Payer: Medicaid Other

## 2022-05-03 LAB — BASIC METABOLIC PANEL
Anion gap: 27 — ABNORMAL HIGH (ref 5–15)
BUN: 115 mg/dL — ABNORMAL HIGH (ref 6–20)
CO2: 7 mmol/L — ABNORMAL LOW (ref 22–32)
Calcium: 7.6 mg/dL — ABNORMAL LOW (ref 8.9–10.3)
Chloride: 98 mmol/L (ref 98–111)
Creatinine, Ser: 15.06 mg/dL — ABNORMAL HIGH (ref 0.61–1.24)
GFR, Estimated: 4 mL/min — ABNORMAL LOW (ref >60)
Glucose, Bld: 99 mg/dL (ref 70–99)
Potassium: 5.6 mmol/L — ABNORMAL HIGH (ref 3.5–5.1)
Sodium: 132 mmol/L — ABNORMAL LOW (ref 135–145)

## 2022-05-03 LAB — CBC WITH DIFFERENTIAL/PLATELET
Abs Immature Granulocytes: 0.09 10*3/uL — ABNORMAL HIGH (ref 0.00–0.07)
Basophils Absolute: 0 10*3/uL (ref 0.0–0.1)
Basophils Relative: 0 %
Eosinophils Absolute: 0 10*3/uL (ref 0.0–0.5)
Eosinophils Relative: 0 %
HCT: 34.2 % — ABNORMAL LOW (ref 39.0–52.0)
Hemoglobin: 11.1 g/dL — ABNORMAL LOW (ref 13.0–17.0)
Immature Granulocytes: 1 %
Lymphocytes Relative: 5 %
Lymphs Abs: 0.6 10*3/uL — ABNORMAL LOW (ref 0.7–4.0)
MCH: 29 pg (ref 26.0–34.0)
MCHC: 32.5 g/dL (ref 30.0–36.0)
MCV: 89.3 fL (ref 80.0–100.0)
Monocytes Absolute: 1.3 10*3/uL — ABNORMAL HIGH (ref 0.1–1.0)
Monocytes Relative: 9 %
Neutro Abs: 12 10*3/uL — ABNORMAL HIGH (ref 1.7–7.7)
Neutrophils Relative %: 85 %
Platelets: 291 10*3/uL (ref 150–400)
RBC: 3.83 MIL/uL — ABNORMAL LOW (ref 4.22–5.81)
RDW: 14.6 % (ref 11.5–15.5)
WBC: 14.1 10*3/uL — ABNORMAL HIGH (ref 4.0–10.5)
nRBC: 0 % (ref 0.0–0.2)

## 2022-05-03 LAB — CBG MONITORING, ED: Glucose-Capillary: 108 mg/dL — ABNORMAL HIGH (ref 70–99)

## 2022-05-03 MED ORDER — SODIUM ZIRCONIUM CYCLOSILICATE 5 G PO PACK
5.0000 g | PACK | Freq: Once | ORAL | Status: DC
  Filled 2022-05-03 (×2): qty 1

## 2022-05-03 NOTE — ED Notes (Signed)
Pt refused covid swab. °

## 2022-05-03 NOTE — ED Notes (Signed)
Patient refused Medication I educated on the importance of the medication but he still refused.

## 2022-05-03 NOTE — ED Provider Notes (Signed)
[place] Provider Note   CSN: [id number] Arrival date & time: 05/02/22  1631     History  Chief Complaint  Patient presents with   Missed Dialysis    [name] is a 51 y.o. male.  The history is provided by the patient and medical records.   51 y.o. M with hx of ESRD on HD, DM2, HLP, homelessness, presenting to the ED requesting HD.  He states he has not had treatment since last Friday 04/26/22.  He cannot tell me why he has missed his other appointments.  He reports some SOB and a cough.  Denies fever.  Apparently has been at the [facility] but [employer] was called today to remove him for trespassing.  Hx of noncompliance with HD regimen.  Home Medications Prior to Admission medications   Medication Sig Start Date End Date Taking? Authorizing Provider  amLODipine (NORVASC) 10 MG tablet take 1 tablet by mouth daily 02/27/22     ASPIRIN LOW DOSE 81 MG tablet Take 81 mg by mouth daily. Patient not taking: Reported on 04/25/2022 03/26/22   [provider]  calcium acetate (PHOSLO) 667 MG capsule Take 2,001 mg by mouth 3 (three) times daily. 03/26/22   [provider]  escitalopram (LEXAPRO) 5 MG tablet Take 1 tablet (5 mg total) by mouth daily. 01/21/22 01/21/23  Ajibola, Ene A, NP  glucose blood test strip Use to check fasting blood sugar once daily. 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 02/26/21   [name], [name], MD  insulin detemir (LEVEMIR FLEXTOUCH) 100 UNIT/ML FlexPen Inject 35 units into the skin once a day 02/27/22     insulin glargine (LANTUS) 100 UNIT/ML Solostar Pen Inject 10 Units into the skin daily. May substitute with any other long-acting formulary. 05/13/21   [name], MD  insulin glargine (LANTUS) 100 UNIT/ML Solostar Pen Inject 5 units into the skin twice a day with a  meal 02/27/22     Insulin Pen Needle 32G X 4 MM MISC Use with insulin pens 02/26/21   [name], [name], MD  phenazopyridine (PYRIDIUM) 200 MG tablet Take 1 tablet (200 mg total) by mouth 3 (three) times daily. 03/02/22   Tacy Learn, PA-C  PROTONIX 40 MG tablet Take 40 mg by mouth daily. 03/19/22   [provider]  sevelamer carbonate (RENVELA) 800 MG tablet Take 2 tablet by mouth three times a day with meals and 1 tablet twice daily as needed with snacks Patient not taking: Reported on 04/26/2022 11/20/21     sitaGLIPtin (JANUVIA) 25 MG tablet take 1 tablet by mouth daily 02/27/22     sodium bicarbonate 650 MG tablet Take 650 mg by mouth daily. 03/19/22   [provider]  triamcinolone cream (KENALOG) 0.1 % Apply 1 Application topically 2 (two) times daily. 01/06/22   Nyoka Lint, PA-C  gabapentin (NEURONTIN) 300 MG capsule Take 1 capsule (300 mg total) by mouth 3 (three) times daily. Patient not taking: Reported on 07/27/2019 07/29/18 08/30/19  [name], FNP  sildenafil (VIAGRA) 25 MG tablet Take 1 tablet (25 mg total) by mouth as needed for erectile dysfunction. for erectile dysfunction. Patient not taking: Reported on 08/11/2020 10/13/19 11/27/20  [name], FNP      Allergies    Sulfa antibiotics, Keflex [cephalexin], Other, and  Bactrim [sulfamethoxazole-trimethoprim]    Review of Systems   Review of Systems  Respiratory:  Positive for cough and shortness of breath.   All other systems reviewed and are negative.   Physical Exam Updated Vital Signs BP (!) 129/92 (BP Location: Left Arm)   Pulse 82   Temp 99.8 F (37.7 C)   Resp 16   SpO2 96%   Physical Exam Vitals and nursing note reviewed.  Constitutional:      Appearance: He is well-developed.  HENT:     Head: Normocephalic and atraumatic.  Eyes:     Conjunctiva/sclera: Conjunctivae normal.     Pupils: Pupils are equal, round, and reactive to light.  Cardiovascular:     Rate and Rhythm:  Normal rate and regular rhythm.     Heart sounds: Normal heart sounds.  Pulmonary:     Effort: Pulmonary effort is normal.     Breath sounds: Normal breath sounds.     Comments: Dry cough on exam but lungs grossly clear, able to speak in full sentences without difficulty, O2 96% during exam Abdominal:     General: Bowel sounds are normal.     Palpations: Abdomen is soft.  Musculoskeletal:        General: Normal range of motion.     Cervical back: Normal range of motion.  Skin:    General: Skin is warm and dry.  Neurological:     Mental Status: He is alert and oriented to person, place, and time.     ED Results / Procedures / Treatments   Labs (all labs ordered are listed, but only abnormal results are displayed) Labs Reviewed  CBC WITH DIFFERENTIAL/PLATELET - Abnormal; Notable for the following components:      Result Value   WBC 14.1 (*)    RBC 3.83 (*)    Hemoglobin 11.1 (*)    HCT 34.2 (*)    Neutro Abs 12.0 (*)    Lymphs Abs 0.6 (*)    Monocytes Absolute 1.3 (*)    Abs Immature Granulocytes 0.09 (*)    All other components within normal limits  BASIC METABOLIC PANEL - Abnormal; Notable for the following components:   Sodium 132 (*)    Potassium 5.6 (*)    CO2 7 (*)    BUN 115 (*)    Creatinine, Ser 15.06 (*)    Calcium 7.6 (*)    GFR, Estimated 4 (*)    Anion gap 27 (*)    All other components within normal limits  RESP PANEL BY RT-PCR (RSV, FLU A&B, COVID)  RVPGX2    EKG None  Radiology No results found.  Procedures Procedures    Medications Ordered in ED Medications - No data to display  ED Course/ Medical Decision Making/ A&P                           Medical Decision Making Amount and/or Complexity of Data Reviewed Labs: ordered. Radiology: ordered and independent interpretation performed. ECG/medicine tests: ordered and independent interpretation performed.  Risk Prescription drug management.   51 year old male presenting to the ED  requesting dialysis.  He was at the [facility] earlier and [employer] escorted him off property for trespassing so he came to the ED.  He states he has not had dialysis in 1 week.  He does report cough but denies fever.  Patient sleeping upon assessment.  He does arouse when spoken to.  His vitals are stable on  RA.  Does not appear to be in any acute respiratory distress but does have dry cough.  CXR obtained, no acute findings.  He refused covid/flu swab.  CBC with leukocytosis, stable anemia.  BMP with K 5.6.  patient is due to usual scheduled dialysis today.  He does have OP center he can attend.  I do not feel he needs to be held in the hospital for emergent dialysis at this time.  His vitals remain stable.  He was given dose of lokelma.  Long discussion about needing to attend his dialysis today.  He can return here for new concerns.  Final Clinical Impression(s) / ED Diagnoses Final diagnoses:  ESRD (end stage renal disease) [facility])    Rx / DC Orders ED Discharge Orders     None         Larene Pickett, PA-C 05/03/22 3017    Merryl Hacker, MD 05/05/22 2358

## 2022-05-03 NOTE — Discharge Instructions (Addendum)
Go to your dialysis today as scheduled. Return here for new concerns.

## 2022-05-06 ENCOUNTER — Other Ambulatory Visit (HOSPITAL_COMMUNITY): Payer: Self-pay

## 2022-05-07 ENCOUNTER — Other Ambulatory Visit: Payer: Self-pay

## 2022-05-08 ENCOUNTER — Emergency Department (HOSPITAL_COMMUNITY)
Admission: EM | Admit: 2022-05-08 | Discharge: 2022-05-09 | Discharge status: Against Medical Advice | Payer: Medicaid Other | Attending: Student | Admitting: Student

## 2022-05-08 ENCOUNTER — Encounter (HOSPITAL_COMMUNITY): Payer: Self-pay

## 2022-05-08 ENCOUNTER — Other Ambulatory Visit: Payer: Self-pay

## 2022-05-08 ENCOUNTER — Ambulatory Visit: Payer: Commercial Managed Care - HMO | Attending: General Practice | Admitting: Cardiovascular Disease

## 2022-05-08 ENCOUNTER — Encounter: Payer: Self-pay | Admitting: Cardiovascular Disease

## 2022-05-08 ENCOUNTER — Emergency Department (HOSPITAL_COMMUNITY): Payer: Medicaid Other

## 2022-05-08 VITALS — BP 164/84 | HR 94 | Ht 67.0 in | Wt 138.0 lb

## 2022-05-08 DIAGNOSIS — I519 Heart disease, unspecified: Secondary | ICD-10-CM

## 2022-05-08 DIAGNOSIS — Z5321 Procedure and treatment not carried out due to patient leaving prior to being seen by health care provider: Secondary | ICD-10-CM | POA: Insufficient documentation

## 2022-05-08 DIAGNOSIS — E785 Hyperlipidemia, unspecified: Secondary | ICD-10-CM | POA: Diagnosis not present

## 2022-05-08 DIAGNOSIS — I509 Heart failure, unspecified: Secondary | ICD-10-CM | POA: Diagnosis not present

## 2022-05-08 DIAGNOSIS — Z992 Dependence on renal dialysis: Secondary | ICD-10-CM | POA: Insufficient documentation

## 2022-05-08 DIAGNOSIS — Z5900 Homelessness unspecified: Secondary | ICD-10-CM

## 2022-05-08 DIAGNOSIS — U071 COVID-19: Secondary | ICD-10-CM | POA: Insufficient documentation

## 2022-05-08 NOTE — Progress Notes (Signed)
Heart and Vascular Care Navigation  05/08/2022  [name] Jan 22, 1971 [id number]  Reason for Referral: homeless, challenges w/ compliance.  Patient is participating in a Managed Medicaid Plan:Yes  Engaged with patient face to face for initial visit for Heart and Vascular Care Coordination.                                                                                                   Assessment:  LCSW met with pt at request of care team. Introduced self, role, reason for visit. Little engagement with pt as he kept his eyes shut throughout assessment. Just discharged from [facility] today. Pt utilizes [facility] to get mail- is highly aware of their services. Has been intermittently homeless since 2022, prior to that time he would occasionally stay with his mother. He dialyzes when he goes at [facility] on [address] but with frequent admissions and lack of stable housing he often misses. I will reach out to caseworker at pt dialysis center and let them know pt had come to our clinic. Pt has been approved for disability with [employer] assistance, but hasn't started receiving his checks. He otherwise has no income, stores his stuff outside and stays on the streets when not at the hospital. His brother [name] lives in [place]. His phone works when he has wifi, has email address [name].   We completed VISPDAT and referral to [facility] through coordinated entry/[facility]. Due to current coughing/medical recommendations pt will be transported to the hospital after our clinic but I will try and f/u with inpatient [facility] team to continue to see if we can help as able.                                      HRT/VAS Care Coordination     Patients Home Cardiology Office [place] Team Social Worker   Social Worker Name: Westley Hummer, [place], [place]   Living arrangements for the past 2 months Homeless   Lives with: Self   Patient Current Insurance  Coverage Medicaid; Commercial Insurance   Patient Has Concern With Paying Medical Bills No   Does Patient Have Prescription Coverage? Yes   Home Assistive Devices/Equipment None   DME Agency NA   Current home services Other (comment)  na       Social History:                                                                             SDOH Screenings   Food Insecurity: Food Insecurity Present (05/08/2022)  Housing: Medium Risk (05/08/2022)  Transportation Needs: Unmet Transportation Needs (05/08/2022)  Utilities: Not At Risk (05/08/2022)  Depression (PHQ2-9): Low Risk  (10/13/2019)  Financial Resource Strain: High Risk (05/08/2022)  Tobacco Use: Low Risk  (05/08/2022)    SDOH Interventions: Financial Resources:  Financial Strain Interventions: Other (Comment) (pt approved for disability per his report- hasnt started receiving)  Food Insecurity:  Food Insecurity Interventions: Other (Comment) (referred to [place])  Housing Insecurity:  Housing Interventions: Other (Comment) (provided housing list/coordinated entry number/referral made for [facility] winter housing)  Transportation:   Transportation Interventions: Payor Benefit, Bus Pass Given    Other Care Navigation Interventions:     Provided Pharmacy assistance resources  Encouraged pt to call me when he is discharged so we can assist with medication access   Follow-up plan:   LCSW provided pt with my card, list of housing assistance and multiple bus passes. Pt being transferred to hospital at this time. Referral and VISPDAT sent in to [facility] for further housing assistance if available.

## 2022-05-08 NOTE — ED Triage Notes (Signed)
PER EMS: pt tested positive for covid on 12/15. He was seen at [facility] today and was sent here to the ED to have a chest xray due to him having crackles in his lung sounds as well as a hx of CHF. He received a full dialysis treatment today at [facility].  BP- 162/82, HR- 97% RA, HR-80 CBG-244

## 2022-05-08 NOTE — ED Provider Triage Note (Signed)
Emergency Medicine Provider Triage Evaluation Note  [name] , a 51 y.o. male  was evaluated in triage.  Patient presents by EMS.  Per EMS patient was told he had crackles at heart failure clinic today and needed a chest x-ray.  Patient will not communicate with me.  I reviewed cardiology note, there is no mention about ED visit.  Copied below.  ASSESSMENT AND PLAN:    Hyperlipidemia LDL goal <70 History of hyperlipidemia not on statin therapy with lipid profile performed 02/18/2021 revealed a total cholesterol 160, LDL 98 and HDL 88.   Left ventricular dysfunction History of nonischemic cardiomyopathy with an EF of 40 to 45% on 03/19/2019 down to 30 to 35% 11/27/2020.  His volume is managed by hemodialysis.  I suspect he has medication noncompliance.  He does have crackles at his bases.  I am going to refer him to the advanced heart failure clinic for further evaluation and medication optimization.   Homelessness Apparently awaiting disability.  He is minimally communicative and wearing paper close.  I doubt whether he is taking his medications even if he has some.  Will get social services involved to see if we can fill facilitate shelter and medications.  Review of Systems  Per HPI  Physical Exam  BP (!) 158/80 (BP Location: Left Arm)   Pulse 84   Resp 19   SpO2 93%  Gen:   Awake, no distress   Resp:  Normal effort  MSK:   Moves extremities without difficulty  Other:    Medical Decision Making  Medically screening exam initiated at 4:50 PM.  Appropriate orders placed.  [name] was informed that the remainder of the evaluation will be completed by another provider, this initial triage assessment does not replace that evaluation, and the importance of remaining in the ED until their evaluation is complete.  Chest xray.    Sherrill Raring, PA-C 05/08/22 1652

## 2022-05-08 NOTE — Patient Instructions (Signed)
Medication Instructions:  Your physician recommends that you continue on your current medications as directed. Please refer to the Current Medication list given to you today.  *If you need a refill on your cardiac medications before your next appointment, please call your pharmacy*   Follow-Up: At [facility], you and your health needs are our priority.  As part of our continuing mission to provide you with exceptional heart care, we have created designated Provider Care Teams.  These Care Teams include your primary Cardiologist (physician) and Advanced Practice Providers (APPs -  Physician Assistants and Nurse Practitioners) who all work together to provide you with the care you need, when you need it.  We recommend signing up for the patient portal called "MyChart".  Sign up information is provided on this After Visit Summary.  MyChart is used to connect with patients for Virtual Visits (Telemedicine).  Patients are able to view lab/test results, encounter notes, upcoming appointments, etc.  Non-urgent messages can be sent to your provider as well.   To learn more about what you can do with MyChart, go to [email or web address].    Your next appointment:   12 month(s)  The format for your next appointment:   In Person  Provider:   Jonathan Berry, MD   

## 2022-05-08 NOTE — Assessment & Plan Note (Signed)
Apparently awaiting disability.  He is minimally communicative and wearing paper close.  I doubt whether he is taking his medications even if he has some.  Will get social services involved to see if we can fill facilitate shelter and medications.

## 2022-05-08 NOTE — Assessment & Plan Note (Signed)
History of nonischemic cardiomyopathy with an EF of 40 to 45% on 03/19/2019 down to 30 to 35% 11/27/2020.  His volume is managed by hemodialysis.  I suspect he has medication noncompliance.  He does have crackles at his bases.  I am going to refer him to the advanced heart failure clinic for further evaluation and medication optimization.

## 2022-05-08 NOTE — Assessment & Plan Note (Signed)
History of hyperlipidemia not on statin therapy with lipid profile performed 02/18/2021 revealed a total cholesterol 160, LDL 98 and HDL 88.

## 2022-05-08 NOTE — Progress Notes (Signed)
05/08/2022 [name]   1971/05/18  [id number]  Primary Physician Criss Rosales, Clinic Primary Cardiologist: Lorretta Harp MD Lupe Carney, [place]  HPI:  [name] is a 51 y.o.  thin appearing married African-American male father of 2 living children (1 deceased) with no grandchildren who is currently currently works as a Scientist, water quality at [employer]. He was referred by Kathe Becton, FNP for evaluation of dyspnea on exertion.  I last saw him in the office 03/07/2020.  He has a history of treated hypertension, hyperlipidemia and diabetes.  There is no family history for heart disease.  Is never had a heart attack or stroke.    He does have chronic left bundle branch block.  He gets dyspneic especially while having sexual intercourse.   He was admitted to the hospital for hypertensive urgency 04/07/2019 after a disagreement with his fiancs Jocelyn.  His enzymes were fairly low.  He did have a coronary calcium score which was 0 and a coronary CTA that showed no evidence of CAD suggesting his chest pain was noncardiac.  He admits to diet dietary discretion with regards to salt and medication noncompliance.  A 2D echo performed 03/19/2019 revealed an EF of 40 to 45%, significantly reduced compared to the previous echo performed 01/02/2016 which showed a normal EF.   He did develop COVID-19 in mid January and was quarantined for 2 weeks.  He recovered from this.  He denies chest pain or shortness of breath.  He has been taking his antihypertensive medications and has avoided salt.   Since I saw him in the office 2 years ago he has begun on hemodialysis.  He has been seen in the ER because of medication noncompliance.  Unfortunately, he is homeless and is awaiting disability.   Current Meds  Medication Sig   amLODipine (NORVASC) 10 MG tablet take 1 tablet by mouth daily   ASPIRIN LOW DOSE 81 MG tablet Take 81 mg by mouth daily.   calcium acetate (PHOSLO) 667 MG capsule Take 2,001 mg by  mouth 3 (three) times daily.   escitalopram (LEXAPRO) 5 MG tablet Take 1 tablet (5 mg total) by mouth daily.   glucose blood test strip Use to check fasting blood sugar once daily. 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 Insulin dependent   insulin aspart (NOVOLOG FLEXPEN) 100 UNIT/ML FlexPen Inject 5 Units into the skin 2 (two) times daily with a meal.   insulin detemir (LEVEMIR FLEXTOUCH) 100 UNIT/ML FlexPen Inject 35 units into the skin once a day   insulin glargine (LANTUS) 100 UNIT/ML Solostar Pen Inject 10 Units into the skin daily. May substitute with any other long-acting formulary.   insulin glargine (LANTUS) 100 UNIT/ML Solostar Pen Inject 5 units into the skin twice a day with a meal   Insulin Pen Needle 32G X 4 MM MISC Use with insulin pens   phenazopyridine (PYRIDIUM) 200 MG tablet Take 1 tablet (200 mg total) by mouth 3 (three) times daily.   PROTONIX 40 MG tablet Take 40 mg by mouth daily.   sevelamer carbonate (RENVELA) 800 MG tablet Take 2 tablet by mouth three times a day with meals and 1 tablet twice daily as needed with snacks   sitaGLIPtin (JANUVIA) 25 MG tablet take 1 tablet by mouth daily   sodium bicarbonate 650 MG tablet Take 650 mg by mouth daily.   triamcinolone cream (KENALOG) 0.1 % Apply 1 Application topically 2 (two) times daily.     Allergies  Allergen Reactions  Sulfa Antibiotics Rash    Severe rash.    Keflex [Cephalexin] Other (See Comments)    Patient with severe drug reaction including exfoliating skin rash and hypotension after being prescribed both cephalexin and sulfamethoxazole-trimethoprim simultaneously. Favor SMX as much more likely culprit as patient had tolerated other cephalosporins in the past, but cannot say with complete certainty that this was not related to cephalexin.    Other Other (See Comments)    Patient states he is allergic to the TB test - unknown reaction during childhood    Bactrim [Sulfamethoxazole-Trimethoprim] Rash    Social History    Socioeconomic History   Marital status: Legally Separated    Spouse name: Not on file   Number of children: Not on file   Years of education: Not on file   Highest education level: Not on file  Occupational History   Occupation: cook    Comment: lost job   Occupation: Biochemist, clinical    Comment: Updated June 2013   Occupation: [place]: Began Jan 2014  Tobacco Use   Smoking status: Never   Smokeless tobacco: Never  Vaping Use   Vaping Use: Never used  Substance and Sexual Activity   Alcohol use: No    Alcohol/week: 0.0 standard drinks of alcohol   Drug use: No   Sexual activity: Yes    Partners: Female  Other Topics Concern   Not on file  Social History Narrative   Lives with his mother and brother.   Wife has had a foot amputation; is a type 1 DM.   Social Determinants of Health   Financial Resource Strain: Not on file  Food Insecurity: Not on file  Transportation Needs: Not on file  Physical Activity: Not on file  Stress: Not on file  Social Connections: Not on file  Intimate Partner Violence: Not on file     Review of Systems: General: negative for chills, fever, night sweats or weight changes.  Cardiovascular: negative for chest pain, dyspnea on exertion, edema, orthopnea, palpitations, paroxysmal nocturnal dyspnea or shortness of breath Dermatological: negative for rash Respiratory: negative for cough or wheezing Urologic: negative for hematuria Abdominal: negative for nausea, vomiting, diarrhea, bright red blood per rectum, melena, or hematemesis Neurologic: negative for visual changes, syncope, or dizziness All other systems reviewed and are otherwise negative except as noted above.    Blood pressure (!) 164/84, pulse 94, height 5\' 7"  (1.702 m), weight 138 lb (62.6 kg).  General appearance: alert and no distress Neck: no adenopathy, no carotid bruit, no JVD, supple, symmetrical, trachea midline, and thyroid not enlarged, symmetric, no  tenderness/mass/nodules Lungs: Crackles at the base bilaterally Heart: regular rate and rhythm, S1, S2 normal, no murmur, click, rub or gallop Extremities: extremities normal, atraumatic, no cyanosis or edema Pulses: 2+ and symmetric Skin: Skin color, texture, turgor normal. No rashes or lesions Neurologic: Grossly normal  EKG not performed today  ASSESSMENT AND PLAN:   Hyperlipidemia LDL goal <70 History of hyperlipidemia not on statin therapy with lipid profile performed 02/18/2021 revealed a total cholesterol 160, LDL 98 and HDL 88.  Left ventricular dysfunction History of nonischemic cardiomyopathy with an EF of 40 to 45% on 03/19/2019 down to 30 to 35% 11/27/2020.  His volume is managed by hemodialysis.  I suspect he has medication noncompliance.  He does have crackles at his bases.  I am going to refer him to the advanced heart failure clinic for further evaluation and medication optimization.  Homelessness  Apparently awaiting disability.  He is minimally communicative and wearing paper close.  I doubt whether he is taking his medications even if he has some.  Will get social services involved to see if we can fill facilitate shelter and medications.     Lorretta Harp MD FACP,FACC,FAHA, [facility] 05/08/2022 3:06 PM

## 2022-05-09 ENCOUNTER — Emergency Department (HOSPITAL_COMMUNITY): Payer: Medicaid Other

## 2022-05-09 ENCOUNTER — Other Ambulatory Visit: Payer: Self-pay

## 2022-05-09 ENCOUNTER — Encounter (HOSPITAL_COMMUNITY): Payer: Self-pay

## 2022-05-09 ENCOUNTER — Emergency Department (HOSPITAL_COMMUNITY)
Admission: EM | Admit: 2022-05-09 | Discharge: 2022-05-10 | Discharge status: Against Medical Advice | Payer: Medicaid Other | Attending: Student | Admitting: Student

## 2022-05-09 DIAGNOSIS — R079 Chest pain, unspecified: Secondary | ICD-10-CM | POA: Diagnosis present

## 2022-05-09 DIAGNOSIS — R059 Cough, unspecified: Secondary | ICD-10-CM | POA: Insufficient documentation

## 2022-05-09 DIAGNOSIS — R0789 Other chest pain: Secondary | ICD-10-CM | POA: Diagnosis not present

## 2022-05-09 DIAGNOSIS — Z5321 Procedure and treatment not carried out due to patient leaving prior to being seen by health care provider: Secondary | ICD-10-CM | POA: Insufficient documentation

## 2022-05-09 LAB — BASIC METABOLIC PANEL
Anion gap: 12 (ref 5–15)
BUN: 45 mg/dL — ABNORMAL HIGH (ref 6–20)
CO2: 26 mmol/L (ref 22–32)
Calcium: 7.1 mg/dL — ABNORMAL LOW (ref 8.9–10.3)
Chloride: 97 mmol/L — ABNORMAL LOW (ref 98–111)
Creatinine, Ser: 9.71 mg/dL — ABNORMAL HIGH (ref 0.61–1.24)
GFR, Estimated: 6 mL/min — ABNORMAL LOW (ref >60)
Glucose, Bld: 160 mg/dL — ABNORMAL HIGH (ref 70–99)
Potassium: 4 mmol/L (ref 3.5–5.1)
Sodium: 135 mmol/L (ref 135–145)

## 2022-05-09 LAB — CBC
HCT: 34.8 % — ABNORMAL LOW (ref 39.0–52.0)
Hemoglobin: 10.9 g/dL — ABNORMAL LOW (ref 13.0–17.0)
MCH: 28.2 pg (ref 26.0–34.0)
MCHC: 31.3 g/dL (ref 30.0–36.0)
MCV: 90.2 fL (ref 80.0–100.0)
Platelets: 416 10*3/uL — ABNORMAL HIGH (ref 150–400)
RBC: 3.86 MIL/uL — ABNORMAL LOW (ref 4.22–5.81)
RDW: 14.3 % (ref 11.5–15.5)
WBC: 7.9 10*3/uL (ref 4.0–10.5)
nRBC: 0 % (ref 0.0–0.2)

## 2022-05-09 LAB — TROPONIN I (HIGH SENSITIVITY): Troponin I (High Sensitivity): 226 ng/L (ref <18)

## 2022-05-09 NOTE — Progress Notes (Signed)
H&V Care Navigation CSW Progress Note  Clinical Social Worker  contacted dialysis treatment center  to f/u on pt as it was noted he left ED before treatment this morning to "go to dialysis". Spoke with Waynard Reeds, LCSW who is familiar with pt as is his shift Glass blower/designer [phone]). Pt did not show for appt this morning at center. His seat time is 6am TThSa. Pt has been in and out of dialysis multiple times, his family in [place] has offered for him to come live with them for ongoing care but he has not decided to pursue that choice. Clinic SW try their best to support pt with ongoing compliance, difficulties. I made them aware referral made for pt to [facility]. Remain available for ongoing collaboration as able.   Patient is participating in a Managed Medicaid Plan:  Yes, [place] Medicaid  [place]: Food Insecurity Present (05/08/2022)  Housing: Medium Risk (05/08/2022)  Transportation Needs: Unmet Transportation Needs (05/08/2022)  Utilities: Not At Risk (05/08/2022)  Depression (PHQ2-9): Low Risk  (10/13/2019)  Financial Resource Strain: High Risk (05/08/2022)  Tobacco Use: Low Risk  (05/08/2022)    Westley Hummer, MSW, [place]  [phone]- work cell phone (preferred) [phone]- desk phone

## 2022-05-09 NOTE — ED Notes (Signed)
Pt stated he was leaving, going to his dialysis appt.

## 2022-05-09 NOTE — ED Provider Triage Note (Addendum)
Emergency Medicine Provider Triage Evaluation Note  [name] , a 51 y.o. male  was evaluated in triage.  Pt complains of 10 out of 10 chest pain in the central portion of his chest.  Patient is unable to describe the pain other than it "hurts".  He also states he is unsure if he is short of breath or not.  Patient is actively coughing throughout examination.  Patient was at the same emergency department yesterday for evaluation of possible crackles in lung fields but left prior to completion of evaluation and treatment. Patient was admitted to an outside hospital from 12/15 - 12/19 for cough and abdominal pain, positive RSV and COVID testing Review of Systems  Positive: As above Negative: Radiation of symptoms, nausea, vomiting  Physical Exam  BP (!) 182/98 (BP Location: Right Wrist)   Pulse 88   Temp 98 F (36.7 C)   Resp 18   SpO2 100%  Gen:   Awake, no distress   Resp:  Normal effort, actively coughing MSK:   Moves extremities without difficulty  Other:    Medical Decision Making  Medically screening exam initiated at 8:57 PM.  Appropriate orders placed.  [name] was informed that the remainder of the evaluation will be completed by another provider, this initial triage assessment does not replace that evaluation, and the importance of remaining in the ED until their evaluation is complete.     Dorothyann Peng, PA-C 05/09/22 2058    Dorothyann Peng, PA-C 05/09/22 2110

## 2022-05-09 NOTE — ED Triage Notes (Signed)
Pt c/o chest pain. Refuses to answer other questions.

## 2022-05-09 NOTE — ED Notes (Signed)
Pt refuse vitals, refusing to wear a mask

## 2022-05-09 NOTE — ED Notes (Signed)
Refused Covid swab

## 2022-05-10 ENCOUNTER — Other Ambulatory Visit: Payer: Self-pay

## 2022-05-10 ENCOUNTER — Emergency Department (HOSPITAL_COMMUNITY): Payer: Medicaid Other

## 2022-05-10 ENCOUNTER — Emergency Department (HOSPITAL_COMMUNITY)
Admission: EM | Admit: 2022-05-10 | Discharge: 2022-05-11 | Discharge status: Against Medical Advice | Payer: Medicaid Other | Source: Home / Self Care

## 2022-05-10 ENCOUNTER — Encounter (HOSPITAL_COMMUNITY): Payer: Self-pay

## 2022-05-10 DIAGNOSIS — Z5321 Procedure and treatment not carried out due to patient leaving prior to being seen by health care provider: Secondary | ICD-10-CM | POA: Insufficient documentation

## 2022-05-10 DIAGNOSIS — R0789 Other chest pain: Secondary | ICD-10-CM | POA: Insufficient documentation

## 2022-05-10 DIAGNOSIS — R059 Cough, unspecified: Secondary | ICD-10-CM | POA: Insufficient documentation

## 2022-05-10 LAB — BASIC METABOLIC PANEL
Anion gap: 13 (ref 5–15)
BUN: 51 mg/dL — ABNORMAL HIGH (ref 6–20)
CO2: 23 mmol/L (ref 22–32)
Calcium: 7 mg/dL — ABNORMAL LOW (ref 8.9–10.3)
Chloride: 99 mmol/L (ref 98–111)
Creatinine, Ser: 10.83 mg/dL — ABNORMAL HIGH (ref 0.61–1.24)
GFR, Estimated: 5 mL/min — ABNORMAL LOW (ref >60)
Glucose, Bld: 124 mg/dL — ABNORMAL HIGH (ref 70–99)
Potassium: 3.7 mmol/L (ref 3.5–5.1)
Sodium: 135 mmol/L (ref 135–145)

## 2022-05-10 LAB — CBC WITH DIFFERENTIAL/PLATELET
Abs Immature Granulocytes: 0.08 10*3/uL — ABNORMAL HIGH (ref 0.00–0.07)
Basophils Absolute: 0 10*3/uL (ref 0.0–0.1)
Basophils Relative: 0 %
Eosinophils Absolute: 0.2 10*3/uL (ref 0.0–0.5)
Eosinophils Relative: 3 %
HCT: 37.2 % — ABNORMAL LOW (ref 39.0–52.0)
Hemoglobin: 12 g/dL — ABNORMAL LOW (ref 13.0–17.0)
Immature Granulocytes: 1 %
Lymphocytes Relative: 11 %
Lymphs Abs: 0.9 10*3/uL (ref 0.7–4.0)
MCH: 29.1 pg (ref 26.0–34.0)
MCHC: 32.3 g/dL (ref 30.0–36.0)
MCV: 90.3 fL (ref 80.0–100.0)
Monocytes Absolute: 0.8 10*3/uL (ref 0.1–1.0)
Monocytes Relative: 10 %
Neutro Abs: 5.6 10*3/uL (ref 1.7–7.7)
Neutrophils Relative %: 75 %
Platelets: 427 10*3/uL — ABNORMAL HIGH (ref 150–400)
RBC: 4.12 MIL/uL — ABNORMAL LOW (ref 4.22–5.81)
RDW: 14.3 % (ref 11.5–15.5)
WBC: 7.6 10*3/uL (ref 4.0–10.5)
nRBC: 0 % (ref 0.0–0.2)

## 2022-05-10 LAB — TROPONIN I (HIGH SENSITIVITY): Troponin I (High Sensitivity): 179 ng/L (ref <18)

## 2022-05-10 NOTE — ED Triage Notes (Signed)
Pt BIB [facility] but would not give a complaint just stated he needed to return to the hospital. Pt was here this morning as well.

## 2022-05-10 NOTE — ED Notes (Signed)
Called for vitals no answer °

## 2022-05-10 NOTE — ED Notes (Signed)
Pt was called x3 no answer 

## 2022-05-10 NOTE — ED Provider Triage Note (Addendum)
Emergency Medicine Provider Triage Evaluation Note  [name] , a 51 y.o. male  was evaluated in triage.  Pt complains of right side chest pain that is worse when he coughs.  Denies shortness of breath.  Patient did not answer any more questions and did not provide other history or symptoms.  Brought by EMS, but would not give a complaint, just stated he needed to return to the hospital.  LWBS earlier today.   Review of Systems  Positive: As above Negative: As above  Physical Exam  BP (!) 142/82 (BP Location: Right Arm)   Pulse 84   Temp 98.8 F (37.1 C) (Oral)   Resp 18   SpO2 98%  Gen:   Awake, no distress   Resp:  Normal effort  MSK:   Moves extremities without difficulty  Other:    Medical Decision Making  Medically screening exam initiated at 3:28 PM.  Appropriate orders placed.  [name] was informed that the remainder of the evaluation will be completed by another provider, this initial triage assessment does not replace that evaluation, and the importance of remaining in the ED until their evaluation is complete.     Theressa Stamps R, [place] 05/10/22 1530    Theressa Stamps R, [place] 05/10/22 [phone]

## 2022-05-10 NOTE — ED Notes (Signed)
Called for vitals no answer X2

## 2022-05-10 NOTE — ED Notes (Signed)
Pt refused vitals 

## 2022-05-11 ENCOUNTER — Encounter (HOSPITAL_COMMUNITY): Payer: Self-pay | Admitting: Emergency Medicine

## 2022-05-11 ENCOUNTER — Emergency Department (HOSPITAL_COMMUNITY)
Admission: EM | Admit: 2022-05-11 | Discharge: 2022-05-12 | Discharge status: Against Medical Advice | Payer: Commercial Managed Care - HMO | Attending: Emergency Medicine | Admitting: Emergency Medicine

## 2022-05-11 ENCOUNTER — Ambulatory Visit (HOSPITAL_COMMUNITY)
Admission: EM | Admit: 2022-05-11 | Discharge: 2022-05-11 | Disposition: A | Discharge status: Home / Self Care | Payer: Commercial Managed Care - HMO | Attending: Internal Medicine | Admitting: Internal Medicine

## 2022-05-11 DIAGNOSIS — Z794 Long term (current) use of insulin: Secondary | ICD-10-CM | POA: Diagnosis not present

## 2022-05-11 DIAGNOSIS — Z992 Dependence on renal dialysis: Secondary | ICD-10-CM | POA: Diagnosis not present

## 2022-05-11 DIAGNOSIS — R053 Chronic cough: Secondary | ICD-10-CM | POA: Diagnosis not present

## 2022-05-11 DIAGNOSIS — Z79899 Other long term (current) drug therapy: Secondary | ICD-10-CM | POA: Diagnosis not present

## 2022-05-11 DIAGNOSIS — E1122 Type 2 diabetes mellitus with diabetic chronic kidney disease: Secondary | ICD-10-CM | POA: Diagnosis not present

## 2022-05-11 DIAGNOSIS — Z7982 Long term (current) use of aspirin: Secondary | ICD-10-CM | POA: Insufficient documentation

## 2022-05-11 DIAGNOSIS — R059 Cough, unspecified: Secondary | ICD-10-CM | POA: Insufficient documentation

## 2022-05-11 DIAGNOSIS — I120 Hypertensive chronic kidney disease with stage 5 chronic kidney disease or end stage renal disease: Secondary | ICD-10-CM | POA: Diagnosis not present

## 2022-05-11 DIAGNOSIS — J069 Acute upper respiratory infection, unspecified: Secondary | ICD-10-CM

## 2022-05-11 DIAGNOSIS — N186 End stage renal disease: Secondary | ICD-10-CM | POA: Insufficient documentation

## 2022-05-11 DIAGNOSIS — Z8616 Personal history of COVID-19: Secondary | ICD-10-CM | POA: Diagnosis not present

## 2022-05-11 DIAGNOSIS — R0781 Pleurodynia: Secondary | ICD-10-CM | POA: Insufficient documentation

## 2022-05-11 DIAGNOSIS — U071 COVID-19: Secondary | ICD-10-CM | POA: Diagnosis not present

## 2022-05-11 DIAGNOSIS — Z5321 Procedure and treatment not carried out due to patient leaving prior to being seen by health care provider: Secondary | ICD-10-CM | POA: Insufficient documentation

## 2022-05-11 DIAGNOSIS — R0789 Other chest pain: Secondary | ICD-10-CM | POA: Diagnosis not present

## 2022-05-11 DIAGNOSIS — Z7984 Long term (current) use of oral hypoglycemic drugs: Secondary | ICD-10-CM | POA: Diagnosis not present

## 2022-05-11 MED ORDER — ACETAMINOPHEN 325 MG PO TABS
975.0000 mg | ORAL_TABLET | Freq: Once | ORAL | Status: AC
  Administered 2022-05-11: 975 mg via ORAL

## 2022-05-11 MED ORDER — ACETAMINOPHEN 325 MG PO TABS
ORAL_TABLET | ORAL | Status: AC
  Filled 2022-05-11: qty 3

## 2022-05-11 NOTE — ED Notes (Signed)
Pt called X2 for triage. Pt could not be found.  

## 2022-05-11 NOTE — ED Triage Notes (Signed)
Pt reports a cough and right side pain when coughing. States this has been occurring for a couple days. Denies taking any medication.

## 2022-05-11 NOTE — Discharge Instructions (Signed)
Your cough is likely due to recent COVID-19 and RSV illness.  This will improve over time. Your side pain is also likely due to coughing. Continue going to dialysis as scheduled. I gave you Tylenol in the clinic to try to help with your pain. You may take Tylenol 650 mg every 6 hours as needed over-the-counter.   If you develop any new or worsening symptoms or do not improve in the next 2 to 3 days, please return.  If your symptoms are severe, please go to the emergency room.  Follow-up with your primary care provider for further evaluation and management of your symptoms as well as ongoing wellness visits.  I hope you feel better!

## 2022-05-11 NOTE — ED Notes (Signed)
Pt refused temperature.

## 2022-05-11 NOTE — ED Provider Notes (Signed)
[facility]    CSN: [id number] Arrival date & time: 05/11/22  1344      History   Chief Complaint Chief Complaint  Patient presents with   Cough    HPI [name] is a 51 y.o. male.   Patient with history of CKD stage IV, end-stage renal disease on dialysis, type 2 diabetes uncontrolled, essential hypertension, and homelessness presents urgent care for evaluation of ongoing cough for "a long time" and right side pain that started a couple of days ago.  He states the right side pain is worse with deep breathing and intense coughing fits.  Patient has been to the emergency department every day for the last 3 days where he has been evaluated with a chest x-ray.  Chest x-ray for the last 3 days has been normal showing stable cardiomegaly.  He denies worsening orthopnea, leg swelling, shortness of breath, chest pain, heart palpitations, nausea, vomiting, dizziness, fever/chills, nasal congestion, ear pain, dizziness, and loss of taste and smell.  Per chart review, patient was recently diagnosed with COVID-19 and RSV on December 15 in the emergency department at [facility].  Patient was admitted to the hospital and was found to have hyperkalemia after missing multiple dialysis sessions.  Patient went to dialysis this morning and states that his session was normal in length, he tolerated this well.  He went back to the ER this morning and attempt to be seen but left prior to being seen due to long wait time.  He has not attempted use of any over-the-counter medicines prior to arrival urgent care for his cough.   Cough   Past Medical History:  Diagnosis Date   Anxiety 02/2019   Anxiety 10/2019   Diabetes mellitus type II, uncontrolled 07/17/2006   Elevated alkaline phosphatase level 01/2019   ESSENTIAL HYPERTENSION 07/17/2006   Homelessness 10/13/2021   HYPERLIPIDEMIA 11/24/2008   Insomnia 02/2019   Left bundle branch block 02/22/2019   Lesion of lip 10/2019   Pneumonia  10/22/2011   Suicidal ideations 02/2019   Vitamin D deficiency 01/2019    Patient Active Problem List   Diagnosis Date Noted   CHF (congestive heart failure) ([place]) 11/15/2021   Nausea vomiting and diarrhea 11/07/2021   Homelessness 10/13/2021   Acute respiratory failure with hypoxia ([place]) 10/13/2021   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 ([place]) 09/28/2021   Aspiration pneumonia ([place]) 09/28/2021   Volume overload 04/12/2021   Mild protein-calorie malnutrition ([place]) 03/06/2021   Aftercare including intermittent dialysis ([place]) 02/27/2021   Allergy, unspecified, initial encounter 02/27/2021   Anemia due to chronic kidney disease 02/27/2021   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   Secondary hyperparathyroidism of renal origin ([place]) 02/27/2021   Shortness of breath 02/27/2021   Type 2 diabetes mellitus with other diabetic kidney complication ([place]) 60/63/0160   Hypertensive urgency 02/18/2021   Type 2 diabetes mellitus ([place]) 02/17/2021   Acute on chronic combined systolic and diastolic CHF (congestive heart failure) ([place]) 11/29/2020   Hypertensive crisis 11/27/2020   Stage 4 chronic kidney disease ([place]) 02/22/2020   Left ventricular dysfunction 04/27/2019   Hemoglobin A1C greater than 9%, indicating poor diabetic control 07/29/2018   LBBB (left bundle branch block)    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   Hyperlipidemia LDL goal <70 11/24/2008   DM2 (diabetes mellitus, type 2) ([place]) 07/17/2006   T2DM (type 2 diabetes mellitus) ([place]) 07/17/2006   ESRD (end stage renal disease) ([place]) 07/17/2006  Essential hypertension 07/17/2006    Past Surgical History:  Procedure Laterality Date   ARM SURGERY     AV FISTULA PLACEMENT Right 02/20/2021   Procedure: RIGHT ARTERIOVENOUS GRAFT CREATION;  Surgeon: Angelia Mould, MD;  Location: [facility];  Service: Vascular;  Laterality: Right;   INSERTION OF DIALYSIS CATHETER Left 02/20/2021   Procedure:  INSERTION OF DIALYSIS CATHETER USING PALINDROME CATHETER;  Surgeon: Angelia Mould, MD;  Location: [facility];  Service: Vascular;  Laterality: Left;   IR AV DIALY SHUNT INTRO NEEDLE/INTRACATH INITIAL W/PTA/IMG RIGHT Right 11/06/2021   IR REMOVAL TUN CV CATH W/O FL  07/27/2021   IR US GUIDE VASC ACCESS RIGHT  11/06/2021   ULTRASOUND GUIDANCE FOR VASCULAR ACCESS  02/20/2021   Procedure: ULTRASOUND GUIDANCE FOR VASCULAR ACCESS;  Surgeon: Angelia Mould, MD;  Location: [facility] OR;  Service: Vascular;;       Home Medications    Prior to Admission medications   Medication Sig Start Date End Date Taking? Authorizing Provider  amLODipine (NORVASC) 10 MG tablet take 1 tablet by mouth daily 02/27/22     ASPIRIN LOW DOSE 81 MG tablet Take 81 mg by mouth daily. 03/26/22   [provider]  calcium acetate (PHOSLO) 667 MG capsule Take 2,001 mg by mouth 3 (three) times daily. 03/26/22   [provider]  escitalopram (LEXAPRO) 5 MG tablet Take 1 tablet (5 mg total) by mouth daily. 01/21/22 01/21/23  Ajibola, Ene A, NP  glucose blood test strip Use to check fasting blood sugar once daily. 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 02/26/21   [name], [name], MD  insulin detemir (LEVEMIR FLEXTOUCH) 100 UNIT/ML FlexPen Inject 35 units into the skin once a day 02/27/22     insulin glargine (LANTUS) 100 UNIT/ML Solostar Pen Inject 10 Units into the skin daily. May substitute with any other long-acting formulary. 05/13/21   [name], MD  insulin glargine (LANTUS) 100 UNIT/ML Solostar Pen Inject 5 units into the skin twice a day with a meal 02/27/22     Insulin Pen Needle 32G X 4 MM MISC Use with insulin pens 02/26/21   [name], [name], MD  phenazopyridine (PYRIDIUM) 200 MG tablet Take 1 tablet (200 mg total) by mouth 3 (three) times daily.  03/02/22   Tacy Learn, PA-C  PROTONIX 40 MG tablet Take 40 mg by mouth daily. 03/19/22   [provider]  sevelamer carbonate (RENVELA) 800 MG tablet Take 2 tablet by mouth three times a day with meals and 1 tablet twice daily as needed with snacks 11/20/21     sitaGLIPtin (JANUVIA) 25 MG tablet take 1 tablet by mouth daily 02/27/22     sodium bicarbonate 650 MG tablet Take 650 mg by mouth daily. 03/19/22   [provider]  triamcinolone cream (KENALOG) 0.1 % Apply 1 Application topically 2 (two) times daily. 01/06/22   Nyoka Lint, PA-C  gabapentin (NEURONTIN) 300 MG capsule Take 1 capsule (300 mg total) by mouth 3 (three) times daily. Patient not taking: Reported on 07/27/2019 07/29/18 08/30/19  [name], FNP  sildenafil (VIAGRA) 25 MG tablet Take 1 tablet (25 mg total) by mouth as needed for erectile dysfunction. for erectile dysfunction. Patient not taking: Reported on 08/11/2020 10/13/19 11/27/20  [name], FNP    Family History Family History  Problem Relation Age of Onset   Hypertension Mother 76   Cancer Mother        bladder cancer   Alcohol abuse Father    Hypertension Brother     Social History Social History   Tobacco Use   Smoking status: Never   Smokeless tobacco: Never  Vaping Use   Vaping Use: Never used  Substance Use Topics   Alcohol use: No    Alcohol/week: 0.0 standard drinks of alcohol   Drug use: No     Allergies   Sulfa antibiotics, Keflex [cephalexin], Other, and Bactrim [sulfamethoxazole-trimethoprim]   Review of Systems Review of Systems  Respiratory:  Positive for cough.   Per HPI   Physical Exam Triage Vital Signs ED Triage Vitals  Enc Vitals Group     BP 05/11/22 1617 (!) 179/94     Pulse Rate 05/11/22 1617 86     Resp 05/11/22 1617 17     Temp 05/11/22 1617 98.7 F (37.1 C)     Temp Source 05/11/22 1617 Oral     SpO2 05/11/22 1617 95 %     Weight --      Height --      Head Circumference --       Peak Flow --      Pain Score 05/11/22 1614 10     Pain Loc --      Pain Edu? --      Excl. in Colon? --    No data found.  Updated Vital Signs BP (!) 179/94 (BP Location: Left Arm)   Pulse 86   Temp 98.7 F (37.1 C) (Oral)   Resp 17   SpO2 95%   Visual Acuity Right Eye Distance:   Left Eye Distance:   Bilateral Distance:    Right Eye Near:   Left Eye Near:    Bilateral Near:     Physical Exam Vitals and nursing note reviewed.  Constitutional:      Appearance: He is not ill-appearing or toxic-appearing.  HENT:     Head: Normocephalic and atraumatic.     Right Ear: Hearing, tympanic membrane, ear canal and external ear normal.     Left Ear: Hearing, tympanic membrane, ear canal and external ear normal.     Nose: Nose normal.     Mouth/Throat:     Lips: Pink.     Mouth: Mucous membranes are moist.     Pharynx: No posterior oropharyngeal erythema.  Eyes:     General: Lids are normal. Vision grossly intact. Gaze aligned appropriately.     Extraocular Movements: Extraocular movements intact.     Conjunctiva/sclera: Conjunctivae normal.  Cardiovascular:     Rate and Rhythm: Normal rate and regular rhythm.     Heart sounds: Normal heart sounds, S1 normal and S2 normal.  Pulmonary:     Effort: Pulmonary effort is normal. No respiratory distress.     Breath sounds: Normal breath sounds and air entry.     Comments: No adventitious sounds to auscultation.  Musculoskeletal:     Cervical back: Neck supple.     Right lower leg: Edema present.     Left lower leg: Edema present.  Lymphadenopathy:     Cervical: No cervical adenopathy.  Skin:    General: Skin is warm and dry.     Capillary Refill: Capillary refill takes less than 2 seconds.     Findings: No rash.  Neurological:     General: No focal deficit present.  Mental Status: He is alert and oriented to person, place, and time. Mental status is at baseline.     Cranial Nerves: No dysarthria or facial asymmetry.   Psychiatric:        Mood and Affect: Mood normal.        Speech: Speech normal.        Behavior: Behavior normal.        Thought Content: Thought content normal.        Judgment: Judgment normal.      UC Treatments / Results  Labs (all labs ordered are listed, but only abnormal results are displayed) Labs Reviewed - No data to display  EKG   Radiology DG Chest 2 View  Result Date: 05/10/2022 CLINICAL DATA:  Chest pain. EXAM: CHEST - 2 VIEW COMPARISON:  May 09, 2022. FINDINGS: Stable cardiomegaly. Both lungs are clear. The visualized skeletal structures are unremarkable. IMPRESSION: No active cardiopulmonary disease. Electronically Signed   By: Marijo Conception M.D.   On: 05/10/2022 16:24   DG Chest 2 View  Result Date: 05/09/2022 CLINICAL DATA:  Chest pain EXAM: CHEST - 2 VIEW COMPARISON:  Chest x-ray dated May 16, 2022 FINDINGS: Unchanged cardiomegaly. Mild bilateral interstitial opacities, similar to prior exam. No large pleural effusion or pneumothorax. IMPRESSION: 1. Unchanged cardiomegaly 2. Mild bilateral interstitial opacities, likely due to mild pulmonary edema. Electronically Signed   By: Yetta Glassman M.D.   On: 05/09/2022 21:35    Procedures Procedures (including critical care time)  Medications Ordered in UC Medications  acetaminophen (TYLENOL) tablet 975 mg (975 mg Oral Given 05/11/22 1655)    Initial Impression / Assessment and Plan / UC Course  I have reviewed the triage vital signs and the nursing notes.  Pertinent labs & imaging results that were available during my care of the patient were reviewed by me and considered in my medical decision making (see chart for details).   1. Viral URI with cough, COVID-19  Per chart review, patient was recently admitted at [facility] with hyperkalemia and was found to have positive COVID-19 and RSV test. He has missed multiple dialysis appointments over the last month as he does not have a telephone,  is currently homeless, and this unfortunately contributes to noncompliance. Cough is likely due to recent COVID-19 and RSV illnesses, this will improve in time. Has had multiple visits in the ED since discharge from [facility]. Most recently in the ED yesterday and the day before where he received full workup for chest discomfort. Chest x-rays show stable findings without focal consolidation or pneumonia. He is currently fatigued appearing, however nontoxic. Appears well hydrated, oxygenating at 95% on room air. Labs from ED visits reviewed and also show stable findings. Rib cage pain likely due to persistent cough. Will give tylenol 1,000mg  for ribcage pain. May take tylenol 1,000mg  every 6 hours as needed outpatient if needed for pain. Strict ER return precautions discussed.   Discussed physical exam and available lab work findings in clinic with patient.  Counseled patient regarding appropriate use of medications and potential side effects for all medications recommended or prescribed today. Discussed red flag signs and symptoms of worsening condition,when to call the PCP office, return to urgent care, and when to seek higher level of care in the emergency department. Patient verbalizes understanding and agreement with plan. All questions answered. Patient discharged in stable condition.    Final Clinical Impressions(s) / UC Diagnoses   Final diagnoses:  Viral URI with cough  COVID-19     Discharge Instructions      Your cough is likely due to recent COVID-19 and RSV illness.  This will improve over time. Your side pain is also likely due to coughing. Continue going to dialysis as scheduled. I gave you Tylenol in the clinic to try to help with your pain. You may take Tylenol 650 mg every 6 hours as needed over-the-counter.   If you develop any new or worsening symptoms or do not improve in the next 2 to 3 days, please return.  If your symptoms are severe, please go to the emergency  room.  Follow-up with your primary care provider for further evaluation and management of your symptoms as well as ongoing wellness visits.  I hope you feel better!   ED Prescriptions   None    PDMP not reviewed this encounter.   Talbot Grumbling, [place] 05/14/22 2012

## 2022-05-11 NOTE — ED Notes (Signed)
Pt name called 3x no response

## 2022-05-12 ENCOUNTER — Emergency Department (HOSPITAL_COMMUNITY)
Admission: EM | Admit: 2022-05-12 | Discharge: 2022-05-12 | Disposition: A | Discharge status: Home / Self Care | Payer: Commercial Managed Care - HMO | Source: Home / Self Care | Attending: Emergency Medicine | Admitting: Emergency Medicine

## 2022-05-12 ENCOUNTER — Encounter (HOSPITAL_COMMUNITY): Payer: Self-pay | Admitting: Emergency Medicine

## 2022-05-12 ENCOUNTER — Emergency Department (HOSPITAL_COMMUNITY): Payer: Commercial Managed Care - HMO

## 2022-05-12 ENCOUNTER — Other Ambulatory Visit (HOSPITAL_COMMUNITY): Payer: Commercial Managed Care - HMO

## 2022-05-12 DIAGNOSIS — Z79899 Other long term (current) drug therapy: Secondary | ICD-10-CM | POA: Insufficient documentation

## 2022-05-12 DIAGNOSIS — N186 End stage renal disease: Secondary | ICD-10-CM | POA: Insufficient documentation

## 2022-05-12 DIAGNOSIS — R0781 Pleurodynia: Secondary | ICD-10-CM | POA: Diagnosis not present

## 2022-05-12 DIAGNOSIS — R053 Chronic cough: Secondary | ICD-10-CM | POA: Insufficient documentation

## 2022-05-12 DIAGNOSIS — Z992 Dependence on renal dialysis: Secondary | ICD-10-CM | POA: Insufficient documentation

## 2022-05-12 DIAGNOSIS — Z794 Long term (current) use of insulin: Secondary | ICD-10-CM | POA: Insufficient documentation

## 2022-05-12 DIAGNOSIS — I120 Hypertensive chronic kidney disease with stage 5 chronic kidney disease or end stage renal disease: Secondary | ICD-10-CM | POA: Insufficient documentation

## 2022-05-12 DIAGNOSIS — Z8616 Personal history of COVID-19: Secondary | ICD-10-CM | POA: Insufficient documentation

## 2022-05-12 DIAGNOSIS — Z7984 Long term (current) use of oral hypoglycemic drugs: Secondary | ICD-10-CM | POA: Insufficient documentation

## 2022-05-12 DIAGNOSIS — Z7982 Long term (current) use of aspirin: Secondary | ICD-10-CM | POA: Insufficient documentation

## 2022-05-12 DIAGNOSIS — E1122 Type 2 diabetes mellitus with diabetic chronic kidney disease: Secondary | ICD-10-CM | POA: Insufficient documentation

## 2022-05-12 DIAGNOSIS — R0789 Other chest pain: Secondary | ICD-10-CM | POA: Insufficient documentation

## 2022-05-12 LAB — BASIC METABOLIC PANEL
Anion gap: 12 (ref 5–15)
BUN: 61 mg/dL — ABNORMAL HIGH (ref 6–20)
CO2: 23 mmol/L (ref 22–32)
Calcium: 6.5 mg/dL — ABNORMAL LOW (ref 8.9–10.3)
Chloride: 102 mmol/L (ref 98–111)
Creatinine, Ser: 11.65 mg/dL — ABNORMAL HIGH (ref 0.61–1.24)
GFR, Estimated: 5 mL/min — ABNORMAL LOW (ref >60)
Glucose, Bld: 206 mg/dL — ABNORMAL HIGH (ref 70–99)
Potassium: 3.9 mmol/L (ref 3.5–5.1)
Sodium: 137 mmol/L (ref 135–145)

## 2022-05-12 LAB — CBC WITH DIFFERENTIAL/PLATELET
Abs Immature Granulocytes: 0.05 10*3/uL (ref 0.00–0.07)
Abs Immature Granulocytes: 0.09 10*3/uL — ABNORMAL HIGH (ref 0.00–0.07)
Basophils Absolute: 0 10*3/uL (ref 0.0–0.1)
Basophils Absolute: 0 10*3/uL (ref 0.0–0.1)
Basophils Relative: 0 %
Basophils Relative: 0 %
Eosinophils Absolute: 0.2 10*3/uL (ref 0.0–0.5)
Eosinophils Absolute: 0.2 10*3/uL (ref 0.0–0.5)
Eosinophils Relative: 2 %
Eosinophils Relative: 3 %
HCT: 35.7 % — ABNORMAL LOW (ref 39.0–52.0)
HCT: 36.1 % — ABNORMAL LOW (ref 39.0–52.0)
Hemoglobin: 10.8 g/dL — ABNORMAL LOW (ref 13.0–17.0)
Hemoglobin: 11.4 g/dL — ABNORMAL LOW (ref 13.0–17.0)
Immature Granulocytes: 1 %
Immature Granulocytes: 1 %
Lymphocytes Relative: 12 %
Lymphocytes Relative: 14 %
Lymphs Abs: 1.2 10*3/uL (ref 0.7–4.0)
Lymphs Abs: 1.3 10*3/uL (ref 0.7–4.0)
MCH: 28.6 pg (ref 26.0–34.0)
MCH: 28.9 pg (ref 26.0–34.0)
MCHC: 30.3 g/dL (ref 30.0–36.0)
MCHC: 31.6 g/dL (ref 30.0–36.0)
MCV: 91.4 fL (ref 80.0–100.0)
MCV: 94.7 fL (ref 80.0–100.0)
Monocytes Absolute: 1.1 10*3/uL — ABNORMAL HIGH (ref 0.1–1.0)
Monocytes Absolute: 1.4 10*3/uL — ABNORMAL HIGH (ref 0.1–1.0)
Monocytes Relative: 12 %
Monocytes Relative: 14 %
Neutro Abs: 6.4 10*3/uL (ref 1.7–7.7)
Neutro Abs: 7.2 10*3/uL (ref 1.7–7.7)
Neutrophils Relative %: 70 %
Neutrophils Relative %: 71 %
Platelets: 452 10*3/uL — ABNORMAL HIGH (ref 150–400)
Platelets: 461 10*3/uL — ABNORMAL HIGH (ref 150–400)
RBC: 3.77 MIL/uL — ABNORMAL LOW (ref 4.22–5.81)
RBC: 3.95 MIL/uL — ABNORMAL LOW (ref 4.22–5.81)
RDW: 14.2 % (ref 11.5–15.5)
RDW: 14.4 % (ref 11.5–15.5)
WBC: 10.1 10*3/uL (ref 4.0–10.5)
WBC: 9.2 10*3/uL (ref 4.0–10.5)
nRBC: 0 % (ref 0.0–0.2)
nRBC: 0 % (ref 0.0–0.2)

## 2022-05-12 LAB — TROPONIN I (HIGH SENSITIVITY): Troponin I (High Sensitivity): 231 ng/L (ref <18)

## 2022-05-12 LAB — COMPREHENSIVE METABOLIC PANEL
ALT: 17 U/L (ref 0–44)
AST: 21 U/L (ref 15–41)
Albumin: 2.9 g/dL — ABNORMAL LOW (ref 3.5–5.0)
Alkaline Phosphatase: 168 U/L — ABNORMAL HIGH (ref 38–126)
Anion gap: 15 (ref 5–15)
BUN: 68 mg/dL — ABNORMAL HIGH (ref 6–20)
CO2: 21 mmol/L — ABNORMAL LOW (ref 22–32)
Calcium: 6.8 mg/dL — ABNORMAL LOW (ref 8.9–10.3)
Chloride: 103 mmol/L (ref 98–111)
Creatinine, Ser: 12.31 mg/dL — ABNORMAL HIGH (ref 0.61–1.24)
GFR, Estimated: 5 mL/min — ABNORMAL LOW (ref >60)
Glucose, Bld: 107 mg/dL — ABNORMAL HIGH (ref 70–99)
Potassium: 4.1 mmol/L (ref 3.5–5.1)
Sodium: 139 mmol/L (ref 135–145)
Total Bilirubin: 0.4 mg/dL (ref 0.3–1.2)
Total Protein: 7.2 g/dL (ref 6.5–8.1)

## 2022-05-12 MED ORDER — LIDOCAINE 5 % EX PTCH
1.0000 | MEDICATED_PATCH | CUTANEOUS | Status: DC
  Administered 2022-05-12: 1 via TRANSDERMAL
  Filled 2022-05-12 (×2): qty 1

## 2022-05-12 NOTE — ED Notes (Signed)
Pt does not have a place of residence. RN has attempted to call mother, girlfriend, and brother on file 2x and did not get an answer.

## 2022-05-12 NOTE — ED Notes (Signed)
Pt went to the cafeteria and has not yet return.

## 2022-05-12 NOTE — ED Provider Notes (Signed)
[facility] EMERGENCY DEPARTMENT Provider Note   CSN: [id number] Arrival date & time: 05/12/22  1902     History  Chief Complaint  Patient presents with   Cough   Chest Pain    [name] is a 51 y.o. male.  Pt is a 51y/o male with hx of ESRD on hemodialysis hypertension diabetes noncompliance who was recently admitted for COVID and RSV requiring O2 at [facility] but weaned after steroids and dialysis (last dialysis on Monday of past week) who is presenting today with chest pain that he reports has been present all week and persistent cough.  Patient reports that he has missed dialysis since the last treatment when he was in the hospital because he cannot get transportation there because he currently does not have a phone.  He does not have any shortness of breath today.  He does not use inhalers regularly.   Cough Associated symptoms: chest pain   Chest Pain Associated symptoms: cough        Home Medications Prior to Admission medications   Medication Sig Start Date End Date Taking? Authorizing Provider  amLODipine (NORVASC) 10 MG tablet take 1 tablet by mouth daily 02/27/22     ASPIRIN LOW DOSE 81 MG tablet Take 81 mg by mouth daily. 03/26/22   [provider]  calcium acetate (PHOSLO) 667 MG capsule Take 2,001 mg by mouth 3 (three) times daily. 03/26/22   [provider]  escitalopram (LEXAPRO) 5 MG tablet Take 1 tablet (5 mg total) by mouth daily. 01/21/22 01/21/23  Ajibola, Ene A, NP  glucose blood test strip Use to check fasting blood sugar once daily. 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 02/26/21   [name], [name], MD  insulin detemir (LEVEMIR FLEXTOUCH) 100 UNIT/ML FlexPen Inject 35 units into the skin once a day 02/27/22     insulin glargine (LANTUS) 100 UNIT/ML Solostar Pen Inject  10 Units into the skin daily. May substitute with any other long-acting formulary. 05/13/21   [name], MD  insulin glargine (LANTUS) 100 UNIT/ML Solostar Pen Inject 5 units into the skin twice a day with a meal 02/27/22     Insulin Pen Needle 32G X 4 MM MISC Use with insulin pens 02/26/21   [name], [name], MD  phenazopyridine (PYRIDIUM) 200 MG tablet Take 1 tablet (200 mg total) by mouth 3 (three) times daily. 03/02/22   Tacy Learn, PA-C  PROTONIX 40 MG tablet Take 40 mg by mouth daily. 03/19/22   [provider]  sevelamer carbonate (RENVELA) 800 MG tablet Take 2 tablet by mouth three times a day with meals and 1 tablet twice daily as needed with snacks 11/20/21     sitaGLIPtin (JANUVIA) 25 MG tablet take 1 tablet by mouth daily 02/27/22     sodium bicarbonate 650 MG tablet Take 650 mg by mouth daily. 03/19/22   [provider]  triamcinolone cream (KENALOG) 0.1 % Apply 1 Application topically 2 (two) times daily. 01/06/22   Nyoka Lint, PA-C  gabapentin (NEURONTIN) 300 MG capsule Take 1 capsule (300 mg total) by mouth 3 (three) times daily. Patient not taking: Reported on 07/27/2019 07/29/18 08/30/19  [name], FNP  sildenafil (VIAGRA) 25 MG tablet Take 1 tablet (25 mg total) by mouth as needed for erectile dysfunction. for erectile  dysfunction. Patient not taking: Reported on 08/11/2020 10/13/19 11/27/20  [name], FNP      Allergies    Sulfa antibiotics, Keflex [cephalexin], Other, and Bactrim [sulfamethoxazole-trimethoprim]    Review of Systems   Review of Systems  Respiratory:  Positive for cough.   Cardiovascular:  Positive for chest pain.    Physical Exam Updated Vital Signs BP (!) 169/88 (BP Location: Left Arm)   Pulse 95   Temp 98 F (36.7 C) (Oral)   Resp 16   Ht 5\' 7"  (1.702 m)   Wt 62.6 kg   SpO2 97%   BMI 21.61 kg/m  Physical Exam Vitals and nursing note reviewed.  Constitutional:      General: He is not in acute  distress.    Appearance: He is well-developed.  HENT:     Head: Normocephalic and atraumatic.  Eyes:     Conjunctiva/sclera: Conjunctivae normal.     Pupils: Pupils are equal, round, and reactive to light.  Cardiovascular:     Rate and Rhythm: Normal rate and regular rhythm.     Heart sounds: No murmur heard. Pulmonary:     Effort: Pulmonary effort is normal. No respiratory distress.     Breath sounds: Normal breath sounds. No wheezing or rales.     Comments: Right rib tenderness Chest:     Chest wall: Tenderness present.  Abdominal:     General: There is no distension.     Palpations: Abdomen is soft.     Tenderness: There is no abdominal tenderness. There is no guarding or rebound.  Musculoskeletal:        General: No tenderness. Normal range of motion.     Cervical back: Normal range of motion and neck supple.     Right lower leg: Edema present.     Left lower leg: Edema present.  Skin:    General: Skin is warm and dry.     Findings: No erythema or rash.  Neurological:     Mental Status: He is alert and oriented to person, place, and time. Mental status is at baseline.  Psychiatric:        Behavior: Behavior normal.     ED Results / Procedures / Treatments   Labs (all labs ordered are listed, but only abnormal results are displayed) Labs Reviewed  CBC WITH DIFFERENTIAL/PLATELET - Abnormal; Notable for the following components:      Result Value   RBC 3.77 (*)    Hemoglobin 10.8 (*)    HCT 35.7 (*)    Platelets 452 (*)    Monocytes Absolute 1.4 (*)    All other components within normal limits  COMPREHENSIVE METABOLIC PANEL - Abnormal; Notable for the following components:   CO2 21 (*)    Glucose, Bld 107 (*)    BUN 68 (*)    Creatinine, Ser 12.31 (*)    Calcium 6.8 (*)    Albumin 2.9 (*)    Alkaline Phosphatase 168 (*)    GFR, Estimated 5 (*)    All other components within normal limits  TROPONIN I (HIGH SENSITIVITY) - Abnormal; Notable for the following  components:   Troponin I (High Sensitivity) 231 (*)    All other components within normal limits  TROPONIN I (HIGH SENSITIVITY)    EKG EKG Interpretation  Date/Time:  Sunday May 12 2022 19:16:12 EST Ventricular Rate:  89 PR Interval:  140 QRS Duration: 148 QT Interval:  440 QTC Calculation: 535 R Axis:   -46  Text Interpretation: Normal sinus rhythm Biatrial enlargement Left axis deviation Left bundle branch block No significant change since last tracing When compared with ECG of 12-May-2022 00:29, PREVIOUS ECG IS PRESENT Confirmed by Blanchie Dessert [phone]) on 05/12/2022 8:23:37 PM  Radiology DG Chest 2 View  Result Date: 05/12/2022 CLINICAL DATA:  Right-sided chest pain worse with coughing EXAM: CHEST - 2 VIEW COMPARISON:  05/12/2022 at 12:53 a.m. FINDINGS: Stable marked cardiomegaly. Pulmonary vascular congestion. Bibasilar atelectasis. No pleural effusion. No pneumothorax. No displaced rib fractures. IMPRESSION: Stable cardiomegaly and pulmonary vascular congestion. Electronically Signed   By: Placido Sou M.D.   On: 05/12/2022 20:21   DG Chest 2 View  Result Date: 05/12/2022 CLINICAL DATA:  Chest pain, cough EXAM: CHEST - 2 VIEW COMPARISON:  05/10/2022 FINDINGS: Lungs are clear. No pneumothorax or pleural effusion. Cardiac size is moderately enlarged, unchanged. Pulmonary vascular redistribution of the lung apices again noted in keeping with early cardiogenic failure or mild volume overload. No superimposed overt pulmonary edema. No acute bone abnormality. IMPRESSION: 1. Stable cardiomegaly. 2. Pulmonary vascular redistribution in keeping with early cardiogenic failure or mild volume overload. Electronically Signed   By: Fidela Salisbury M.D.   On: 05/12/2022 01:29    Procedures Procedures    Medications Ordered in ED Medications  lidocaine (LIDODERM) 5 % 1 patch (1 patch Transdermal Patch Applied 05/12/22 2136)    ED Course/ Medical Decision Making/ A&P                            Medical Decision Making Amount and/or Complexity of Data Reviewed Labs: ordered. Decision-making details documented in ED Course. Radiology: ordered and independent interpretation performed. Decision-making details documented in ED Course. ECG/medicine tests: ordered. Decision-making details documented in ED Course.   Pt with multiple medical problems and comorbidities and presenting today with a complaint that caries a high risk for morbidity and mortality.  Here today with complaint of chest pain and missed dialysis.  Patient's last dialysis was 6 days ago and he has not been following up due to transportation issues.  Patient reports he does not have a phone at this time and he is homeless.  He recently had RSV and COVID last week he was admitted to [facility].  Persistent cough is most likely related to the known recent viral illnesses.  Patient has distal edema consistent with missed dialysis but breath sounds are clear without wheezing.  He denies any infectious symptoms or fever.  O2 sats are 100% on room air.  Patient has been seen multiple times this week and has not received any dialysis because labs have been stable.  I independently interpreted patient's labs and EKG today.  EKG with persistent left bundle branch block but no other acute findings.  Troponin mildly elevated from baseline at 231 but most likely related to not having dialysis, CBC with stable hemoglobin at 10.8 and CMP with findings typical of ESRD with normal potassium today, creatinine of 12.31, BUN of 68 and anion gap of 15.  I have independently visualized and interpreted pt's images today.  Chest x-ray shows cardiomegaly but no significant changes and vascular congestion is improved from prior x-ray.  Will discuss patient with Dr. Posey Pronto the nephrologist.  At this time patient does not meet any criteria for emergent dialysis.  He is in no acute distress at this time.  Findings discussed with the patient and he  reports that he wants dialysis however discussed  with him that he really should follow-up with his dialysis center on Tuesday to get a full course of dialysis.          Final Clinical Impression(s) / ED Diagnoses Final diagnoses:  Chest wall pain  Chronic cough    Rx / DC Orders ED Discharge Orders     None         Blanchie Dessert, MD 05/12/22 2159

## 2022-05-12 NOTE — ED Notes (Signed)
Patient being uncooperative with xray staff about getting xrays. Educated patient that xrays were part of his care; especially with the complaint of chest pain. Patient unwilling to sit back in wheelchair even though he was told he could fall out the way he is sitting. Patient taken to xray.

## 2022-05-12 NOTE — ED Triage Notes (Signed)
PT having rib pain and denies compliance with dialysis since Monday.

## 2022-05-12 NOTE — ED Notes (Signed)
Pt refused temperature check.

## 2022-05-12 NOTE — ED Notes (Signed)
Pt refused to have his blood drawn, MD Plunkett aware.

## 2022-05-12 NOTE — Discharge Instructions (Signed)
All your labs looked okay today.  They will not dialyze you.  You should go to dialysis on Tuesday to your center

## 2022-05-12 NOTE — ED Notes (Signed)
Patient called for treatment room 5 times-no answer

## 2022-05-12 NOTE — ED Notes (Signed)
Patient states he just wants to sleep. Refused vitals

## 2022-05-12 NOTE — ED Provider Triage Note (Signed)
Emergency Medicine Provider Triage Evaluation Note  [name] , a 51 y.o. male  was evaluated in triage.  Pt complains of right rib pain with coughing.  Diagnosed with covid-19 05/03/22.  Reports he hasn't had dialysis since Monday 05/06/22.  Review of Systems  Positive: Rib pain, needs dialysis Negative: fever  Physical Exam  BP (!) 167/84 (BP Location: Left Arm)   Pulse 91   Temp 98.1 F (36.7 C)   Resp 17   SpO2 97%  Gen:   Awake, no distress   Resp:  Normal effort  MSK:   Moves extremities without difficulty  Other:    Medical Decision Making  Medically screening exam initiated at 12:18 AM.  Appropriate orders placed.  [name] was informed that the remainder of the evaluation will be completed by another provider, this initial triage assessment does not replace that evaluation, and the importance of remaining in the ED until their evaluation is complete.  Rib pain w/coughing.  Also needs dialysis, no treatment in nearly 1 week.  EKG, labs, CXR ordered.  Tested + for covid-19 05/03/22.   Larene Pickett, PA-C 05/12/22 0020

## 2022-05-12 NOTE — ED Provider Triage Note (Signed)
Emergency Medicine Provider Triage Evaluation Note  [name] , a 51 y.o. male  was evaluated in triage.  Pt complains of right rib cage pain when coughing. Denies SOB. States he's missed dialysis for last week d/t transportation issues.  Review of Systems  Positive: Chest pain Negative: SOB  Physical Exam  BP (!) 170/92 (BP Location: Left Arm)   Pulse 91   Temp 98 F (36.7 C) (Oral)   Resp 16   SpO2 100%  Gen:   Awake, no distress   Resp:  Normal effort  MSK:   Moves extremities without difficulty  Other:  +right chest wall ttp  Medical Decision Making  Medically screening exam initiated at 7:19 PM.  Appropriate orders placed.  [name] was informed that the remainder of the evaluation will be completed by another provider, this initial triage assessment does not replace that evaluation, and the importance of remaining in the ED until their evaluation is complete.     Osvaldo Shipper, [place] 05/12/22 1921

## 2022-05-12 NOTE — ED Triage Notes (Signed)
Pt to ED via EMS from [facility] c/o cough and chest wall pain. Reports missed dialysis.  Last VS: 170/100, p 76., 94% No medications given by EMS. To ED yesterday for the same,

## 2022-05-13 ENCOUNTER — Other Ambulatory Visit: Payer: Self-pay

## 2022-05-13 ENCOUNTER — Emergency Department (HOSPITAL_COMMUNITY): Payer: Medicaid Other

## 2022-05-13 ENCOUNTER — Encounter (HOSPITAL_COMMUNITY): Payer: Self-pay | Admitting: Emergency Medicine

## 2022-05-13 ENCOUNTER — Emergency Department (HOSPITAL_COMMUNITY)
Admission: EM | Admit: 2022-05-13 | Discharge: 2022-05-13 | Disposition: A | Discharge status: Home / Self Care | Payer: Medicaid Other | Attending: Emergency Medicine | Admitting: Emergency Medicine

## 2022-05-13 DIAGNOSIS — I509 Heart failure, unspecified: Secondary | ICD-10-CM | POA: Diagnosis not present

## 2022-05-13 DIAGNOSIS — Z794 Long term (current) use of insulin: Secondary | ICD-10-CM | POA: Diagnosis not present

## 2022-05-13 DIAGNOSIS — E1122 Type 2 diabetes mellitus with diabetic chronic kidney disease: Secondary | ICD-10-CM | POA: Diagnosis not present

## 2022-05-13 DIAGNOSIS — R059 Cough, unspecified: Secondary | ICD-10-CM | POA: Diagnosis not present

## 2022-05-13 DIAGNOSIS — N186 End stage renal disease: Secondary | ICD-10-CM | POA: Diagnosis not present

## 2022-05-13 DIAGNOSIS — R109 Unspecified abdominal pain: Secondary | ICD-10-CM | POA: Diagnosis present

## 2022-05-13 DIAGNOSIS — Z79899 Other long term (current) drug therapy: Secondary | ICD-10-CM | POA: Insufficient documentation

## 2022-05-13 DIAGNOSIS — R0789 Other chest pain: Secondary | ICD-10-CM | POA: Diagnosis not present

## 2022-05-13 DIAGNOSIS — Z992 Dependence on renal dialysis: Secondary | ICD-10-CM | POA: Insufficient documentation

## 2022-05-13 DIAGNOSIS — R000 Tachycardia, unspecified: Secondary | ICD-10-CM | POA: Insufficient documentation

## 2022-05-13 DIAGNOSIS — Z7982 Long term (current) use of aspirin: Secondary | ICD-10-CM | POA: Insufficient documentation

## 2022-05-13 MED ORDER — INSULIN DETEMIR 100 UNIT/ML ~~LOC~~ SOLN
35.0000 [IU] | Freq: Once | SUBCUTANEOUS | Status: AC
  Administered 2022-05-13: 35 [IU] via SUBCUTANEOUS
  Filled 2022-05-13: qty 0.35

## 2022-05-13 MED ORDER — SODIUM BICARBONATE 650 MG PO TABS
650.0000 mg | ORAL_TABLET | Freq: Once | ORAL | Status: AC
  Administered 2022-05-13: 650 mg via ORAL
  Filled 2022-05-13: qty 1

## 2022-05-13 MED ORDER — CALCIUM ACETATE (PHOS BINDER) 667 MG PO CAPS
667.0000 mg | ORAL_CAPSULE | Freq: Once | ORAL | Status: AC
  Administered 2022-05-13: 667 mg via ORAL
  Filled 2022-05-13: qty 1

## 2022-05-13 MED ORDER — LINAGLIPTIN 5 MG PO TABS
5.0000 mg | ORAL_TABLET | Freq: Every day | ORAL | Status: DC
  Administered 2022-05-13: 5 mg via ORAL
  Filled 2022-05-13: qty 1

## 2022-05-13 MED ORDER — OXYCODONE-ACETAMINOPHEN 5-325 MG PO TABS
1.0000 | ORAL_TABLET | Freq: Once | ORAL | Status: AC
  Administered 2022-05-13: 1 via ORAL
  Filled 2022-05-13: qty 1

## 2022-05-13 MED ORDER — CARVEDILOL 3.125 MG PO TABS
6.2500 mg | ORAL_TABLET | Freq: Two times a day (BID) | ORAL | Status: DC
  Administered 2022-05-13: 6.25 mg via ORAL
  Filled 2022-05-13: qty 2

## 2022-05-13 NOTE — ED Provider Notes (Signed)
[place] DEPT Provider Note   CSN: [id number] Arrival date & time: 05/13/22  1518     History  Chief Complaint  Patient presents with   Abdominal Pain    [name] is a 51 y.o. male with a history of type 2 diabetes on insulin, congestive heart failure, end-stage renal disease on dialysis, last session on Monday, presenting to ED with multiple complaints.  Primarily [name] is complaining about 8 ongoing persistent cough and right-sided chest pain.  [name] was in the hospital at [facility] per review of external records and discharged 10 days ago, on 1215, where [name] was found to have COVID as well as RSV.  [name] required a small amount of oxygen when [name] was weaned off of it.  [name] was treated with Decadron and 1 dose of remdesivir.  [name] continues to have a cough, although his other symptoms have improved.  [name] reports to me that [name] is homeless, and is missing many of his medications because "people just feel it for me".  Subsequently [name] has been seen several times in the ED since then.  Most recently [name] was seen yesterday evening in the emergency department, rehad labs, showed a potassium that was normal at 4.1.  Calcium mildly low at 6.8.  Sodium within normal limits at 139.  Hemoglobin was stable for him at 10.8.  No leukocytosis.  Troponins were also elevated but flat and within normal limits for his end-stage renal disease.  [name] has had numerous x-rays for chest pain and shortness of breath in the past week.  His x-ray showed cardiomegaly with some vascular congestion but have not shown any infiltrates.  HPI     Home Medications Prior to Admission medications   Medication Sig Start Date End Date Taking? Authorizing Provider  amLODipine (NORVASC) 10 MG tablet take 1 tablet by mouth daily 02/27/22     ASPIRIN LOW DOSE 81 MG tablet Take 81 mg by mouth daily. 03/26/22   [provider]  calcium acetate (PHOSLO) 667 MG capsule Take 2,001 mg by mouth 3  (three) times daily. 03/26/22   [provider]  escitalopram (LEXAPRO) 5 MG tablet Take 1 tablet (5 mg total) by mouth daily. 01/21/22 01/21/23  Ajibola, Ene A, NP  glucose blood test strip Use to check fasting blood sugar once daily. 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 02/26/21   [name], [name], MD  insulin detemir (LEVEMIR FLEXTOUCH) 100 UNIT/ML FlexPen Inject 35 units into the skin once a day 02/27/22     insulin glargine (LANTUS) 100 UNIT/ML Solostar Pen Inject 10 Units into the skin daily. May substitute with any other long-acting formulary. 05/13/21   [name], MD  insulin glargine (LANTUS) 100 UNIT/ML Solostar Pen Inject 5 units into the skin twice a day with a meal 02/27/22     Insulin Pen Needle 32G X 4 MM MISC Use with insulin pens 02/26/21   [name], [name], MD  phenazopyridine (PYRIDIUM) 200 MG tablet Take 1 tablet (200 mg total) by mouth 3 (three) times daily. 03/02/22   Tacy Learn, PA-C  PROTONIX 40 MG tablet Take 40 mg by mouth daily. 03/19/22   [provider]  sevelamer carbonate (RENVELA) 800 MG tablet Take 2 tablet by mouth three times a day with meals and 1 tablet twice  daily as needed with snacks 11/20/21     sitaGLIPtin (JANUVIA) 25 MG tablet take 1 tablet by mouth daily 02/27/22     sodium bicarbonate 650 MG tablet Take 650 mg by mouth daily. 03/19/22   [provider]  triamcinolone cream (KENALOG) 0.1 % Apply 1 Application topically 2 (two) times daily. 01/06/22   Nyoka Lint, PA-C  gabapentin (NEURONTIN) 300 MG capsule Take 1 capsule (300 mg total) by mouth 3 (three) times daily. Patient not taking: Reported on 07/27/2019 07/29/18 08/30/19  [name], FNP  sildenafil (VIAGRA) 25 MG tablet Take 1 tablet (25 mg total) by mouth as needed for erectile dysfunction. for erectile  dysfunction. Patient not taking: Reported on 08/11/2020 10/13/19 11/27/20  [name], FNP      Allergies    Sulfa antibiotics, Keflex [cephalexin], Other, and Bactrim [sulfamethoxazole-trimethoprim]    Review of Systems   Review of Systems  Physical Exam Updated Vital Signs BP (!) 158/97   Pulse 95   Resp 15   Ht 5\' 7"  (1.702 m)   Wt 62.6 kg   SpO2 95%   BMI 21.61 kg/m  Physical Exam Constitutional:      General: [name] is not in acute distress. HENT:     Head: Normocephalic and atraumatic.  Eyes:     Conjunctiva/sclera: Conjunctivae normal.     Pupils: Pupils are equal, round, and reactive to light.  Cardiovascular:     Rate and Rhythm: Regular rhythm. Tachycardia present.  Pulmonary:     Effort: Pulmonary effort is normal. No respiratory distress.     Comments: 94% on room air, rhonchi lower lungs Abdominal:     General: There is no distension.     Tenderness: There is no abdominal tenderness.  Musculoskeletal:     Comments: Tenderness along the right lower lateral rib line  Skin:    General: Skin is warm and dry.  Neurological:     General: No focal deficit present.     Mental Status: [name] is alert. Mental status is at baseline.  Psychiatric:        Mood and Affect: Mood normal.        Behavior: Behavior normal.     ED Results / Procedures / Treatments   Labs (all labs ordered are listed, but only abnormal results are displayed) Labs Reviewed - No data to display  EKG EKG Interpretation  Date/Time:  Monday May 13 2022 15:35:09 EST Ventricular Rate:  92 PR Interval:  152 QRS Duration: 155 QT Interval:  444 QTC Calculation: 550 R Axis:   172 Text Interpretation: Sinus rhythm LAE, consider biatrial enlargement IVCD, consider atypical LBBB No significant change from prior tracing Confirmed by Octaviano Glow [phone]) on 05/13/2022 5:29:59 PM  Radiology CT Chest Wo Contrast  Result Date: 05/13/2022 CLINICAL DATA:  Chest pain, nonspecific right  lower chest pain persistent, productive cough - evaluation for PNA not visible on xray (several taken this week) vs rib fracture EXAM: CT CHEST WITHOUT CONTRAST TECHNIQUE: Multidetector CT imaging of the chest was performed following the standard protocol without IV contrast. RADIATION DOSE REDUCTION: This exam was performed according to the departmental dose-optimization program which includes automated exposure control, adjustment of the mA and/or kV according to patient size and/or use of iterative reconstruction technique. COMPARISON:  May 12, 2022; August 09, 2021 FINDINGS: Cardiovascular: Cardiomegaly. Small pericardial effusion, similar in comparison to prior. Main pulmonary artery appears mildly dilated in comparison to the ascending thoracic aorta which could reflect  underlying pulmonary arterial hypertension. Unremarkable course and caliber of the thoracic aorta. Mediastinum/Nodes: No axillary or mediastinal adenopathy. Visualized thyroid is unremarkable. Lungs/Pleura: No pleural effusion or pneumothorax. LEFT retrocardiac ground-glass opacities, similar comparison to prior and favored to reflect atelectasis. Scattered bandlike opacities most consistent with atelectasis. Improvement in RIGHT middle lobe aeration in comparison to prior CT with mild residual streaky linear opacities most consistent with scar/atelectasis. Upper Abdomen: Esophagus is mildly patulous.  No acute abnormality. Musculoskeletal: Gynecomastia. Anasarca. No acute displaced rib fracture. Remote LEFT-sided rib fractures. IMPRESSION: 1. Improvement in RIGHT middle lobe aeration in comparison to prior CT with mild residual streaky linear opacities most consistent with scar/atelectasis. 2. Cardiomegaly with small pericardial effusion. 3. Main pulmonary artery appears mildly dilated in comparison to the ascending thoracic aorta which could reflect underlying pulmonary arterial hypertension. 4. No acute displaced rib fracture.  Electronically Signed   By: Valentino Saxon M.D.   On: 05/13/2022 17:14   DG Chest 2 View  Result Date: 05/12/2022 CLINICAL DATA:  Right-sided chest pain worse with coughing EXAM: CHEST - 2 VIEW COMPARISON:  05/12/2022 at 12:53 a.m. FINDINGS: Stable marked cardiomegaly. Pulmonary vascular congestion. Bibasilar atelectasis. No pleural effusion. No pneumothorax. No displaced rib fractures. IMPRESSION: Stable cardiomegaly and pulmonary vascular congestion. Electronically Signed   By: Placido Sou M.D.   On: 05/12/2022 20:21   DG Chest 2 View  Result Date: 05/12/2022 CLINICAL DATA:  Chest pain, cough EXAM: CHEST - 2 VIEW COMPARISON:  05/10/2022 FINDINGS: Lungs are clear. No pneumothorax or pleural effusion. Cardiac size is moderately enlarged, unchanged. Pulmonary vascular redistribution of the lung apices again noted in keeping with early cardiogenic failure or mild volume overload. No superimposed overt pulmonary edema. No acute bone abnormality. IMPRESSION: 1. Stable cardiomegaly. 2. Pulmonary vascular redistribution in keeping with early cardiogenic failure or mild volume overload. Electronically Signed   By: Fidela Salisbury M.D.   On: 05/12/2022 01:29    Procedures Procedures    Medications Ordered in ED Medications  linagliptin (TRADJENTA) tablet 5 mg (5 mg Oral Given 05/13/22 1714)  carvedilol (COREG) tablet 6.25 mg (6.25 mg Oral Given 05/13/22 1623)  oxyCODONE-acetaminophen (PERCOCET/ROXICET) 5-325 MG per tablet 1 tablet (1 tablet Oral Given 05/13/22 1623)  insulin detemir (LEVEMIR) injection 35 Units (35 Units Subcutaneous Given 05/13/22 1715)  sodium bicarbonate tablet 650 mg (650 mg Oral Given 05/13/22 1714)  calcium acetate (PHOSLO) capsule 667 mg (667 mg Oral Given 05/13/22 1714)    ED Course/ Medical Decision Making/ A&P                           Medical Decision Making Amount and/or Complexity of Data Reviewed Radiology: ordered.  Risk OTC drugs. Prescription drug  management.   Patient is here complaining primarily of right chest wall pain, with persistent coughing.  Differential would include postviral syndrome versus rib fracture versus pneumonia versus other.  Clinically have a lower suspicion for acute pulmonary embolism.  Do not feel [name] needs a CT angiogram.  But I do think a CT scan noncontrast of the chest would be reasonable, given the numerous x-rays [name] has had, I do not see any utility in repeating an x-ray at this time.  Will order CT scan to evaluate for possible occult or underlying pneumonia not visible on x-ray, versus rib fracture.  CT report personally read interpreted, no clear underlying pneumonia, no broken rib fractures.  Suspect the patient is continued to have pleuritic chest  pain in setting of postviral cough.  [name] is no longer contagious with COVID RSV from his hospitalization over 10 days ago.  I have reordered his home medications report [name] is not taking any today because [name] does not currently have a supply of these medications.  This includes his long-acting insulin which [name] is supposed to take once daily, as well as blood pressure and congestive heart failure medication.  [name] was also given food and a dose of Percocet in the ED for pain.  I reviewed his external records as noted above including recent hospitalization stay at [facility].  At this time there is no emergent indication for dialysis.  His potassium is within normal limits yesterday.  [name] is not hypoxic or in acute respiratory failure.  [name] has some chronic pulmonary congestion from his heart failure.  I advised that [name] follow-up for dialysis tomorrow at his usual location.  Patient will be provided with additional close that [name] is requesting at the time of discharge.        Final Clinical Impression(s) / ED Diagnoses Final diagnoses:  Chest wall pain    Rx / DC Orders ED Discharge Orders     None         Wyvonnia Dusky, MD 05/13/22 1737

## 2022-05-13 NOTE — Discharge Instructions (Signed)
Please make sure that you go to dialysis tomorrow as normally scheduled.

## 2022-05-13 NOTE — ED Triage Notes (Signed)
Pt via [facility] picked up from [facility] c/o chronic right-sided abdominal pain, worse x 1 week. Seen earlier today at [facility] for same. Pt is a hemodialysis pt T/Th/Sat last dialyzed one week ago today at [facility]. EMS noted pt is homeless and is noncompliant with prescribed medications for multiple conditions.   BP 182/98 HR 104 CBG 197

## 2022-05-13 NOTE — ED Notes (Signed)
Patient transported to CT 

## 2022-05-15 DIAGNOSIS — Z5321 Procedure and treatment not carried out due to patient leaving prior to being seen by health care provider: Secondary | ICD-10-CM | POA: Insufficient documentation

## 2022-05-15 DIAGNOSIS — R1031 Right lower quadrant pain: Secondary | ICD-10-CM | POA: Insufficient documentation

## 2022-05-15 DIAGNOSIS — R079 Chest pain, unspecified: Secondary | ICD-10-CM | POA: Diagnosis not present

## 2022-05-16 ENCOUNTER — Other Ambulatory Visit: Payer: Self-pay

## 2022-05-16 ENCOUNTER — Emergency Department (HOSPITAL_COMMUNITY)
Admission: EM | Admit: 2022-05-16 | Discharge: 2022-05-16 | Discharge status: Against Medical Advice | Payer: Medicaid Other | Attending: Physician Assistant | Admitting: Physician Assistant

## 2022-05-16 ENCOUNTER — Encounter: Payer: Self-pay | Admitting: Nurse Practitioner

## 2022-05-16 LAB — CBC WITH DIFFERENTIAL/PLATELET
Abs Immature Granulocytes: 0.04 10*3/uL (ref 0.00–0.07)
Basophils Absolute: 0 10*3/uL (ref 0.0–0.1)
Basophils Relative: 0 %
Eosinophils Absolute: 0.1 10*3/uL (ref 0.0–0.5)
Eosinophils Relative: 2 %
HCT: 30.8 % — ABNORMAL LOW (ref 39.0–52.0)
Hemoglobin: 9.8 g/dL — ABNORMAL LOW (ref 13.0–17.0)
Immature Granulocytes: 1 %
Lymphocytes Relative: 12 %
Lymphs Abs: 1.1 10*3/uL (ref 0.7–4.0)
MCH: 29 pg (ref 26.0–34.0)
MCHC: 31.8 g/dL (ref 30.0–36.0)
MCV: 91.1 fL (ref 80.0–100.0)
Monocytes Absolute: 1 10*3/uL (ref 0.1–1.0)
Monocytes Relative: 12 %
Neutro Abs: 6.3 10*3/uL (ref 1.7–7.7)
Neutrophils Relative %: 73 %
Platelets: 456 10*3/uL — ABNORMAL HIGH (ref 150–400)
RBC: 3.38 MIL/uL — ABNORMAL LOW (ref 4.22–5.81)
RDW: 14.4 % (ref 11.5–15.5)
WBC: 8.6 10*3/uL (ref 4.0–10.5)
nRBC: 0 % (ref 0.0–0.2)

## 2022-05-16 LAB — COMPREHENSIVE METABOLIC PANEL
ALT: 14 U/L (ref 0–44)
AST: 17 U/L (ref 15–41)
Albumin: 2.8 g/dL — ABNORMAL LOW (ref 3.5–5.0)
Alkaline Phosphatase: 151 U/L — ABNORMAL HIGH (ref 38–126)
Anion gap: 15 (ref 5–15)
BUN: 101 mg/dL — ABNORMAL HIGH (ref 6–20)
CO2: 18 mmol/L — ABNORMAL LOW (ref 22–32)
Calcium: 7.2 mg/dL — ABNORMAL LOW (ref 8.9–10.3)
Chloride: 104 mmol/L (ref 98–111)
Creatinine, Ser: 14.77 mg/dL — ABNORMAL HIGH (ref 0.61–1.24)
GFR, Estimated: 4 mL/min — ABNORMAL LOW (ref >60)
Glucose, Bld: 285 mg/dL — ABNORMAL HIGH (ref 70–99)
Potassium: 4.9 mmol/L (ref 3.5–5.1)
Sodium: 137 mmol/L (ref 135–145)
Total Bilirubin: 0.4 mg/dL (ref 0.3–1.2)
Total Protein: 7.2 g/dL (ref 6.5–8.1)

## 2022-05-16 LAB — LIPASE, BLOOD: Lipase: 51 U/L (ref 11–51)

## 2022-05-16 LAB — BRAIN NATRIURETIC PEPTIDE: B Natriuretic Peptide: >4500 pg/mL — ABNORMAL HIGH (ref 0.0–100.0)

## 2022-05-16 LAB — TROPONIN I (HIGH SENSITIVITY): Troponin I (High Sensitivity): 173 ng/L (ref <18)

## 2022-05-16 NOTE — ED Provider Triage Note (Signed)
Emergency Medicine Provider Triage Evaluation Note  [name] , a 51 y.o. male  was evaluated in triage.  Pt complains of right-sided lower chest pain.  Seen here 2 days ago for similar.  Recently admitted at OSH for hypoxia due to COVID pneumonia.  Has been seen several times emergency department for similar complaints.  He is unsure if the pain is in his chest or his abdomen.  Had CT scan done 2 days ago which showed improvement in his PNA  Review of Systems  Positive: Right side pain, cough Negative:   Physical Exam  BP (!) 191/93 (BP Location: Right Arm)   Pulse 92   Temp 98.3 F (36.8 C) (Oral)   Resp 18   SpO2 97%  Gen:   Awake, no distress   Resp:  Normal effort  MSK:   Moves extremities without difficulty  Other:    Medical Decision Making  Medically screening exam initiated at 12:23 AM.  Appropriate orders placed.  IHAN PAT was informed that the remainder of the evaluation will be completed by another provider, this initial triage assessment does not replace that evaluation, and the importance of remaining in the ED until their evaluation is complete.  Right CP/abd pain   [name] A, PA-C 05/16/22 0024

## 2022-05-16 NOTE — ED Notes (Signed)
Pt called x 3 no answer 

## 2022-05-16 NOTE — ED Triage Notes (Signed)
Patient reports right lateral flank pain for 1 week , denies injury /no dysuria .

## 2022-05-16 NOTE — Telephone Encounter (Signed)
Error

## 2022-05-16 NOTE — ED Notes (Signed)
Trop=[id number]    Lauris Keepers A, PA-C 05/16/22 0209

## 2022-05-17 ENCOUNTER — Emergency Department (HOSPITAL_COMMUNITY): Payer: Medicaid Other

## 2022-05-17 ENCOUNTER — Other Ambulatory Visit: Payer: Self-pay

## 2022-05-17 ENCOUNTER — Encounter: Payer: Self-pay | Admitting: *Deleted

## 2022-05-17 ENCOUNTER — Emergency Department (HOSPITAL_COMMUNITY)
Admission: EM | Admit: 2022-05-17 | Discharge: 2022-05-17 | Discharge status: Against Medical Advice | Payer: Medicaid Other | Source: Home / Self Care

## 2022-05-17 DIAGNOSIS — R0781 Pleurodynia: Secondary | ICD-10-CM | POA: Insufficient documentation

## 2022-05-17 DIAGNOSIS — J189 Pneumonia, unspecified organism: Secondary | ICD-10-CM | POA: Diagnosis not present

## 2022-05-17 DIAGNOSIS — Z5321 Procedure and treatment not carried out due to patient leaving prior to being seen by health care provider: Secondary | ICD-10-CM | POA: Insufficient documentation

## 2022-05-17 DIAGNOSIS — A419 Sepsis, unspecified organism: Secondary | ICD-10-CM | POA: Diagnosis not present

## 2022-05-17 LAB — BASIC METABOLIC PANEL
Anion gap: 13 (ref 5–15)
BUN: 56 mg/dL — ABNORMAL HIGH (ref 6–20)
CO2: 25 mmol/L (ref 22–32)
Calcium: 7.4 mg/dL — ABNORMAL LOW (ref 8.9–10.3)
Chloride: 102 mmol/L (ref 98–111)
Creatinine, Ser: 9.1 mg/dL — ABNORMAL HIGH (ref 0.61–1.24)
GFR, Estimated: 7 mL/min — ABNORMAL LOW (ref >60)
Glucose, Bld: 150 mg/dL — ABNORMAL HIGH (ref 70–99)
Potassium: 4.4 mmol/L (ref 3.5–5.1)
Sodium: 140 mmol/L (ref 135–145)

## 2022-05-17 NOTE — Congregational Nurse Program (Signed)
  Dept: [phone]   Congregational Nurse Program Note  Date of Encounter: 05/17/2022  Past Medical History: Past Medical History:  Diagnosis Date   Anxiety 02/2019   Anxiety 10/2019   Diabetes mellitus type II, uncontrolled 07/17/2006   Elevated alkaline phosphatase level 01/2019   ESSENTIAL HYPERTENSION 07/17/2006   Homelessness 10/13/2021   HYPERLIPIDEMIA 11/24/2008   Insomnia 02/2019   Left bundle branch block 02/22/2019   Lesion of lip 10/2019   Pneumonia 10/22/2011   Suicidal ideations 02/2019   Vitamin D deficiency 01/2019    Encounter Details:  CNP Questionnaire - 05/17/22 1340       Questionnaire   Ask client: Do you give verbal consent for me to treat you today? Yes    Student Assistance N/A    Location Patient Served  [facility]    Visit Setting with Client Organization    Patient Status Research officer, political party or [place]    Insurance/Financial Assistance Referral N/A    Medication N/A    Medical Provider Yes    Screening Referrals Made N/A    Medical Referrals Made N/A    Medical Appointment Made N/A    Recently w/o PCP, now 1st time PCP visit completed due to CNs referral or appointment made N/A    Food N/A    Transportation N/A    Housing/Utilities No permanent housing    Interpersonal Safety N/A    Interventions Advocate/Support    Abnormal to Normal Screening Since Last CN Visit N/A    Screenings CN Performed N/A    Did client attend any of the following based off CNs referral or appointments made? N/A    ED Visit Averted N/A    Life-Saving Intervention Made N/A           Writer requested to see client as followup from ED visits. Client reports he is in a hurry to get to [facility] office as he was recently approved for disability. Client reports he has transportation to/from dialysis and his medications. He is currently unhoused staying at [facility] during [place]. Completed and sent referral for pallet home to [place] Arli Bree W RN CN

## 2022-05-17 NOTE — ED Triage Notes (Signed)
Pt arrived complaining of right sided rib pain, states it hurts when he coughs  Pt states he had a full dialysis run today, but thought they mentioned a abnormal lab.  States the he is also still swollen and has more fluid on him

## 2022-05-17 NOTE — ED Provider Triage Note (Signed)
Emergency Medicine Provider Triage Evaluation Note  [name] , a 51 y.o. male  was evaluated in triage.  Pt complains of right-sided rib pain been going on for weeks time has remained constant, states that he just want get this checked out, states it hurts mainly when he coughs, he also notes that he went to dialysis today and they told him about an abnormal imaging seen at his last visit and wants more information about this he has gotten his dialysis yesterday he is understanding shortness of breath chest pain worsening peripheral edema..  Review of Systems  Positive: Rib pain abnormal imaging Negative: Chest pain shortness of breath  Physical Exam  BP (!) 155/90 (BP Location: Left Arm)   Pulse 97   Temp 98.9 F (37.2 C) (Oral)   Resp 18   Ht 5\' 7"  (1.702 m)   Wt 81.6 kg   SpO2 90%   BMI 28.19 kg/m  Gen:   Awake, no distress   Resp:  Normal effort  MSK:   Moves extremities without difficulty  Other:    Medical Decision Making  Medically screening exam initiated at 1:18 AM.  Appropriate orders placed.  [name] was informed that the remainder of the evaluation will be completed by another provider, this initial triage assessment does not replace that evaluation, and the importance of remaining in the ED until their evaluation is complete.  Lab or imaging ordered will need further workup.   Marcello Fennel, PA-C 05/17/22 0119

## 2022-05-17 NOTE — ED Notes (Signed)
No answer for room 

## 2022-05-20 ENCOUNTER — Encounter (HOSPITAL_COMMUNITY): Payer: Self-pay | Admitting: Internal Medicine

## 2022-05-20 ENCOUNTER — Emergency Department (HOSPITAL_COMMUNITY): Payer: Medicaid Other

## 2022-05-20 ENCOUNTER — Other Ambulatory Visit: Payer: Self-pay

## 2022-05-20 ENCOUNTER — Inpatient Hospital Stay (HOSPITAL_COMMUNITY)
Admission: EM | Admit: 2022-05-20 | Discharge: 2022-05-28 | DRG: 871 | Disposition: A | Discharge status: Home / Self Care | Payer: Medicaid Other | Attending: Internal Medicine | Admitting: Internal Medicine

## 2022-05-20 DIAGNOSIS — A419 Sepsis, unspecified organism: Secondary | ICD-10-CM | POA: Diagnosis present

## 2022-05-20 DIAGNOSIS — Z8249 Family history of ischemic heart disease and other diseases of the circulatory system: Secondary | ICD-10-CM

## 2022-05-20 DIAGNOSIS — R7881 Bacteremia: Secondary | ICD-10-CM | POA: Diagnosis not present

## 2022-05-20 DIAGNOSIS — I132 Hypertensive heart and chronic kidney disease with heart failure and with stage 5 chronic kidney disease, or end stage renal disease: Secondary | ICD-10-CM | POA: Diagnosis present

## 2022-05-20 DIAGNOSIS — N189 Chronic kidney disease, unspecified: Secondary | ICD-10-CM | POA: Diagnosis present

## 2022-05-20 DIAGNOSIS — J189 Pneumonia, unspecified organism: Principal | ICD-10-CM | POA: Diagnosis present

## 2022-05-20 DIAGNOSIS — Z888 Allergy status to other drugs, medicaments and biological substances status: Secondary | ICD-10-CM | POA: Diagnosis not present

## 2022-05-20 DIAGNOSIS — I10 Essential (primary) hypertension: Secondary | ICD-10-CM | POA: Diagnosis present

## 2022-05-20 DIAGNOSIS — E119 Type 2 diabetes mellitus without complications: Secondary | ICD-10-CM

## 2022-05-20 DIAGNOSIS — I3139 Other pericardial effusion (noninflammatory): Secondary | ICD-10-CM | POA: Diagnosis present

## 2022-05-20 DIAGNOSIS — Z79899 Other long term (current) drug therapy: Secondary | ICD-10-CM

## 2022-05-20 DIAGNOSIS — E785 Hyperlipidemia, unspecified: Secondary | ICD-10-CM | POA: Diagnosis present

## 2022-05-20 DIAGNOSIS — Z992 Dependence on renal dialysis: Secondary | ICD-10-CM | POA: Diagnosis not present

## 2022-05-20 DIAGNOSIS — Z8616 Personal history of COVID-19: Secondary | ICD-10-CM

## 2022-05-20 DIAGNOSIS — J13 Pneumonia due to Streptococcus pneumoniae: Secondary | ICD-10-CM | POA: Diagnosis present

## 2022-05-20 DIAGNOSIS — Z881 Allergy status to other antibiotic agents status: Secondary | ICD-10-CM

## 2022-05-20 DIAGNOSIS — Z91199 Patient's noncompliance with other medical treatment and regimen due to unspecified reason: Secondary | ICD-10-CM

## 2022-05-20 DIAGNOSIS — N186 End stage renal disease: Secondary | ICD-10-CM | POA: Diagnosis present

## 2022-05-20 DIAGNOSIS — N2581 Secondary hyperparathyroidism of renal origin: Secondary | ICD-10-CM | POA: Diagnosis present

## 2022-05-20 DIAGNOSIS — Z91158 Patient's noncompliance with renal dialysis for other reason: Secondary | ICD-10-CM

## 2022-05-20 DIAGNOSIS — Z5900 Homelessness unspecified: Secondary | ICD-10-CM

## 2022-05-20 DIAGNOSIS — D631 Anemia in chronic kidney disease: Secondary | ICD-10-CM | POA: Diagnosis present

## 2022-05-20 DIAGNOSIS — Z91128 Patient's intentional underdosing of medication regimen for other reason: Secondary | ICD-10-CM

## 2022-05-20 DIAGNOSIS — Z882 Allergy status to sulfonamides status: Secondary | ICD-10-CM

## 2022-05-20 DIAGNOSIS — Z794 Long term (current) use of insulin: Secondary | ICD-10-CM

## 2022-05-20 DIAGNOSIS — Z596 Low income: Secondary | ICD-10-CM | POA: Diagnosis not present

## 2022-05-20 DIAGNOSIS — E1122 Type 2 diabetes mellitus with diabetic chronic kidney disease: Secondary | ICD-10-CM | POA: Diagnosis present

## 2022-05-20 DIAGNOSIS — Z811 Family history of alcohol abuse and dependence: Secondary | ICD-10-CM

## 2022-05-20 DIAGNOSIS — Z7982 Long term (current) use of aspirin: Secondary | ICD-10-CM | POA: Diagnosis not present

## 2022-05-20 DIAGNOSIS — I5042 Chronic combined systolic (congestive) and diastolic (congestive) heart failure: Secondary | ICD-10-CM | POA: Diagnosis not present

## 2022-05-20 DIAGNOSIS — E1159 Type 2 diabetes mellitus with other circulatory complications: Secondary | ICD-10-CM | POA: Diagnosis present

## 2022-05-20 DIAGNOSIS — Z5320 Procedure and treatment not carried out because of patient's decision for unspecified reasons: Secondary | ICD-10-CM | POA: Diagnosis present

## 2022-05-20 DIAGNOSIS — Z91148 Patient's other noncompliance with medication regimen for other reason: Secondary | ICD-10-CM | POA: Diagnosis not present

## 2022-05-20 DIAGNOSIS — Z862 Personal history of diseases of the blood and blood-forming organs and certain disorders involving the immune mechanism: Secondary | ICD-10-CM | POA: Diagnosis present

## 2022-05-20 DIAGNOSIS — I152 Hypertension secondary to endocrine disorders: Secondary | ICD-10-CM | POA: Diagnosis present

## 2022-05-20 DIAGNOSIS — I5022 Chronic systolic (congestive) heart failure: Secondary | ICD-10-CM | POA: Diagnosis present

## 2022-05-20 DIAGNOSIS — Z8052 Family history of malignant neoplasm of bladder: Secondary | ICD-10-CM

## 2022-05-20 LAB — CBC WITH DIFFERENTIAL/PLATELET
Abs Immature Granulocytes: 0.05 10*3/uL (ref 0.00–0.07)
Basophils Absolute: 0 10*3/uL (ref 0.0–0.1)
Basophils Relative: 0 %
Eosinophils Absolute: 0.1 10*3/uL (ref 0.0–0.5)
Eosinophils Relative: 1 %
HCT: 31.6 % — ABNORMAL LOW (ref 39.0–52.0)
Hemoglobin: 9.5 g/dL — ABNORMAL LOW (ref 13.0–17.0)
Immature Granulocytes: 1 %
Lymphocytes Relative: 5 %
Lymphs Abs: 0.5 10*3/uL — ABNORMAL LOW (ref 0.7–4.0)
MCH: 28.4 pg (ref 26.0–34.0)
MCHC: 30.1 g/dL (ref 30.0–36.0)
MCV: 94.3 fL (ref 80.0–100.0)
Monocytes Absolute: 0.7 10*3/uL (ref 0.1–1.0)
Monocytes Relative: 7 %
Neutro Abs: 9.4 10*3/uL — ABNORMAL HIGH (ref 1.7–7.7)
Neutrophils Relative %: 86 %
Platelets: 389 10*3/uL (ref 150–400)
RBC: 3.35 MIL/uL — ABNORMAL LOW (ref 4.22–5.81)
RDW: 14.5 % (ref 11.5–15.5)
WBC: 10.7 10*3/uL — ABNORMAL HIGH (ref 4.0–10.5)
nRBC: 0 % (ref 0.0–0.2)

## 2022-05-20 LAB — BLOOD GAS, VENOUS
Acid-Base Excess: 3 mmol/L — ABNORMAL HIGH (ref 0.0–2.0)
Bicarbonate: 29.9 mmol/L — ABNORMAL HIGH (ref 20.0–28.0)
O2 Saturation: 45.2 %
Patient temperature: 37
pCO2, Ven: 58 mmHg (ref 44–60)
pH, Ven: 7.32 (ref 7.25–7.43)
pO2, Ven: 31 mmHg — CL (ref 32–45)

## 2022-05-20 LAB — COMPREHENSIVE METABOLIC PANEL
ALT: 22 U/L (ref 0–44)
AST: 28 U/L (ref 15–41)
Albumin: 3.2 g/dL — ABNORMAL LOW (ref 3.5–5.0)
Alkaline Phosphatase: 192 U/L — ABNORMAL HIGH (ref 38–126)
Anion gap: 12 (ref 5–15)
BUN: 47 mg/dL — ABNORMAL HIGH (ref 6–20)
CO2: 26 mmol/L (ref 22–32)
Calcium: 7.6 mg/dL — ABNORMAL LOW (ref 8.9–10.3)
Chloride: 99 mmol/L (ref 98–111)
Creatinine, Ser: 8.47 mg/dL — ABNORMAL HIGH (ref 0.61–1.24)
GFR, Estimated: 7 mL/min — ABNORMAL LOW (ref >60)
Glucose, Bld: 158 mg/dL — ABNORMAL HIGH (ref 70–99)
Potassium: 4.3 mmol/L (ref 3.5–5.1)
Sodium: 137 mmol/L (ref 135–145)
Total Bilirubin: 0.5 mg/dL (ref 0.3–1.2)
Total Protein: 7.6 g/dL (ref 6.5–8.1)

## 2022-05-20 LAB — I-STAT CHEM 8, ED
BUN: 45 mg/dL — ABNORMAL HIGH (ref 6–20)
Calcium, Ion: 1 mmol/L — ABNORMAL LOW (ref 1.15–1.40)
Chloride: 102 mmol/L (ref 98–111)
Creatinine, Ser: 8.9 mg/dL — ABNORMAL HIGH (ref 0.61–1.24)
Glucose, Bld: 159 mg/dL — ABNORMAL HIGH (ref 70–99)
HCT: 33 % — ABNORMAL LOW (ref 39.0–52.0)
Hemoglobin: 11.2 g/dL — ABNORMAL LOW (ref 13.0–17.0)
Potassium: 4.4 mmol/L (ref 3.5–5.1)
Sodium: 140 mmol/L (ref 135–145)
TCO2: 27 mmol/L (ref 22–32)

## 2022-05-20 LAB — LACTIC ACID, PLASMA: Lactic Acid, Venous: 1.1 mmol/L (ref 0.5–1.9)

## 2022-05-20 LAB — LIPASE, BLOOD: Lipase: 83 U/L — ABNORMAL HIGH (ref 11–51)

## 2022-05-20 LAB — CBG MONITORING, ED
Glucose-Capillary: 164 mg/dL — ABNORMAL HIGH (ref 70–99)
Glucose-Capillary: 240 mg/dL — ABNORMAL HIGH (ref 70–99)
Glucose-Capillary: 131 mg/dL — ABNORMAL HIGH (ref 70–99)
Glucose-Capillary: 205 mg/dL — ABNORMAL HIGH (ref 70–99)

## 2022-05-20 LAB — BETA-HYDROXYBUTYRIC ACID: Beta-Hydroxybutyric Acid: <0.05 mmol/L — ABNORMAL LOW (ref 0.05–0.27)

## 2022-05-20 LAB — ETHANOL: Alcohol, Ethyl (B): <10 mg/dL (ref <10)

## 2022-05-20 MED ORDER — HEPARIN SODIUM (PORCINE) 5000 UNIT/ML IJ SOLN
5000.0000 [IU] | Freq: Three times a day (TID) | INTRAMUSCULAR | Status: DC
  Administered 2022-05-20 – 2022-05-27 (×17): 5000 [IU] via SUBCUTANEOUS
  Filled 2022-05-20 (×21): qty 1

## 2022-05-20 MED ORDER — LEVOFLOXACIN IN D5W 500 MG/100ML IV SOLN: 500.0000 mg | INTRAVENOUS | Status: DC

## 2022-05-20 MED ORDER — ONDANSETRON HCL 4 MG/2ML IJ SOLN
4.0000 mg | Freq: Once | INTRAMUSCULAR | Status: AC
  Administered 2022-05-20: 4 mg via INTRAVENOUS
  Filled 2022-05-20: qty 2

## 2022-05-20 MED ORDER — INSULIN ASPART 100 UNIT/ML IJ SOLN
0.0000 [IU] | Freq: Three times a day (TID) | INTRAMUSCULAR | Status: DC
  Administered 2022-05-20 (×2): 2 [IU] via SUBCUTANEOUS
  Administered 2022-05-22 – 2022-05-27 (×9): 1 [IU] via SUBCUTANEOUS
  Administered 2022-05-28: 2 [IU] via SUBCUTANEOUS
  Filled 2022-05-20: qty 0.06

## 2022-05-20 MED ORDER — CALCIUM ACETATE (PHOS BINDER) 667 MG PO CAPS
2001.0000 mg | ORAL_CAPSULE | Freq: Three times a day (TID) | ORAL | Status: DC
  Administered 2022-05-20 – 2022-05-27 (×19): 2001 mg via ORAL
  Filled 2022-05-20 (×21): qty 3

## 2022-05-20 MED ORDER — ACETAMINOPHEN 650 MG RE SUPP: 650.0000 mg | Freq: Four times a day (QID) | RECTAL | Status: DC | PRN

## 2022-05-20 MED ORDER — ACETAMINOPHEN 500 MG PO TABS
1000.0000 mg | ORAL_TABLET | Freq: Once | ORAL | Status: AC
  Administered 2022-05-20: 1000 mg via ORAL
  Filled 2022-05-20: qty 2

## 2022-05-20 MED ORDER — AMLODIPINE BESYLATE 10 MG PO TABS
10.0000 mg | ORAL_TABLET | Freq: Every day | ORAL | Status: DC
  Filled 2022-05-20: qty 2

## 2022-05-20 MED ORDER — LEVOFLOXACIN IN D5W 750 MG/150ML IV SOLN
750.0000 mg | Freq: Once | INTRAVENOUS | Status: AC
  Administered 2022-05-20: 750 mg via INTRAVENOUS
  Filled 2022-05-20: qty 150

## 2022-05-20 MED ORDER — SODIUM CHLORIDE 0.9 % IV SOLN: Freq: Once | INTRAVENOUS | Status: AC

## 2022-05-20 MED ORDER — ONDANSETRON HCL 4 MG PO TABS
4.0000 mg | ORAL_TABLET | Freq: Four times a day (QID) | ORAL | Status: DC | PRN
  Administered 2022-05-21: 4 mg via ORAL
  Filled 2022-05-20 (×2): qty 1

## 2022-05-20 MED ORDER — ONDANSETRON HCL 4 MG/2ML IJ SOLN
4.0000 mg | Freq: Four times a day (QID) | INTRAMUSCULAR | Status: DC | PRN
  Administered 2022-05-23: 4 mg via INTRAVENOUS
  Filled 2022-05-20: qty 2

## 2022-05-20 MED ORDER — PANTOPRAZOLE SODIUM 40 MG IV SOLR
40.0000 mg | Freq: Once | INTRAVENOUS | Status: AC
  Administered 2022-05-20: 40 mg via INTRAVENOUS
  Filled 2022-05-20: qty 10

## 2022-05-20 MED ORDER — ACETAMINOPHEN 325 MG PO TABS
650.0000 mg | ORAL_TABLET | Freq: Four times a day (QID) | ORAL | Status: DC | PRN
  Administered 2022-05-20: 650 mg via ORAL
  Filled 2022-05-20: qty 2

## 2022-05-20 MED ORDER — SEVELAMER CARBONATE 800 MG PO TABS
1600.0000 mg | ORAL_TABLET | Freq: Three times a day (TID) | ORAL | Status: DC
  Administered 2022-05-20 – 2022-05-27 (×17): 1600 mg via ORAL
  Filled 2022-05-20 (×17): qty 2

## 2022-05-20 MED ORDER — SODIUM BICARBONATE 650 MG PO TABS
650.0000 mg | ORAL_TABLET | Freq: Every day | ORAL | Status: DC
  Filled 2022-05-20: qty 1

## 2022-05-20 NOTE — ED Notes (Signed)
Patient refused to take Norvasc stated "I dont need that" RN replaced BD cuff again and instructed patient to please not remove, clear understanding voiced by patient.

## 2022-05-20 NOTE — ED Triage Notes (Signed)
Patient BIB EMS for evaluation of emesis, chest pain, and abdominal pain.  C/o RLQ abdominal pain with vomiting.  No reports of fever.  Temperature 103 in triage.  Pt is a hemodialysis patient.  States he had dialysis yesterday

## 2022-05-20 NOTE — ED Notes (Signed)
Pt removed all vital sign monitoring devices.

## 2022-05-20 NOTE — ED Notes (Signed)
ED TO INPATIENT HANDOFF REPORT  Name/Age/Gender [name] 52 y.o. male  Code Status    Code Status Orders  (From admission, onward)           Start     Ordered   05/20/22 0750  Full code  Continuous       Question:  By:  Answer:  Consent: discussion documented in EHR   05/20/22 0751           Code Status History     Date Active Date Inactive Code Status Order ID Comments User Context   11/15/2021 1044 11/15/2021 2037 Full Code [id number]  Lequita Halt, MD ED   11/05/2021 1722 11/08/2021 1624 Full Code [id number]  Lequita Halt, MD ED   10/13/2021 0654 10/14/2021 0409 Full Code [id number]  Kristopher Oppenheim, DO Inpatient   10/13/2021 0532 10/13/2021 0654 Full Code [id number]  Kristopher Oppenheim, DO ED   09/28/2021 1639 10/01/2021 1445 Full Code [id number]  Lady Deutscher, MD ED   05/11/2021 2123 05/13/2021 2124 Full Code [id number]  Shela Leff, MD ED   04/12/2021 0244 04/17/2021 0146 Full Code [id number]  Howerter, Ethelda Chick, DO ED   02/17/2021 2006 02/26/2021 2113 Full Code [id number]  Orma Flaming, MD ED   11/27/2020 1049 11/29/2020 2226 Full Code [id number]  Karmen Bongo, MD ED   04/08/2019 0710 04/08/2019 2014 Full Code [id number]  Cristescu, Linard Millers, MD ED   07/09/2015 1051 07/10/2015 2032 Full Code [id number]  Riccardo Dubin, MD Inpatient   09/08/2012 1914 09/11/2012 1801 Full Code [id number]  Hester Mates, MD Inpatient   10/22/2011 0305 10/25/2011 2003 Full Code [id number]  Trish Fountain, MD Inpatient       Home/SNF/Other Home  Chief Complaint CAP (community acquired pneumonia) [J18.9]  Level of Care/Admitting Diagnosis ED Disposition     ED Disposition  Admit   Condition  --   [place]: [place] [[id number]]  Level of Care: Telemetry Medical [104]  May admit patient to [facility] or [facility] if equivalent level of care is available:: No  Covid Evaluation: Symptomatic Person Under Investigation (PUI) or recent exposure (last 10  days) *Testing Required*  Diagnosis: CAP (community acquired pneumonia) [408144]  Admitting Physician: Reubin Milan [[id number]]  Attending Physician: Reubin Milan [[id number]]  Certification:: I certify this patient will need inpatient services for at least 2 midnights  Estimated Length of Stay: 2          Medical History Past Medical History:  Diagnosis Date   Anxiety 02/2019   Anxiety 10/2019   Diabetes mellitus type II, uncontrolled 07/17/2006   Elevated alkaline phosphatase level 01/2019   ESSENTIAL HYPERTENSION 07/17/2006   Homelessness 10/13/2021   HYPERLIPIDEMIA 11/24/2008   Insomnia 02/2019   Left bundle branch block 02/22/2019   Lesion of lip 10/2019   Pneumonia 10/22/2011   Suicidal ideations 02/2019   Vitamin D deficiency 01/2019    Allergies Allergies  Allergen Reactions   Sulfa Antibiotics Rash    Severe rash.    Keflex [Cephalexin] Other (See Comments)    Patient with severe drug reaction including exfoliating skin rash and hypotension after being prescribed both cephalexin and sulfamethoxazole-trimethoprim simultaneously. Favor SMX as much more likely culprit as patient had tolerated other cephalosporins in the past, but cannot say with complete certainty that this was not related to cephalexin.    Other Other (See Comments)    Patient states he is allergic to the TB  test - unknown reaction during childhood    Bactrim [Sulfamethoxazole-Trimethoprim] Rash    IV Location/Drains/Wounds Patient Lines/Drains/Airways Status     Active Line/Drains/Airways     Name Placement date Placement time Site Days   Peripheral IV 05/20/22 20 G 1.88" Left;Upper;Lateral Arm 05/20/22  0503  Arm  less than 1   Fistula / Graft Right Forearm Arteriovenous vein graft 02/20/21  1451  Forearm  454            Labs/Imaging Results for orders placed or performed during the hospital encounter of 05/20/22 (from the past 48 hour(s))  CBG monitoring, ED     Status: Abnormal    Collection Time: 05/20/22  5:04 AM  Result Value Ref Range   Glucose-Capillary 164 (H) 70 - 99 mg/dL    Comment: Glucose reference range applies only to samples taken after fasting for at least 8 hours.  Beta-hydroxybutyric acid     Status: Abnormal   Collection Time: 05/20/22  5:05 AM  Result Value Ref Range   Beta-Hydroxybutyric Acid <0.05 (L) 0.05 - 0.27 mmol/L    Comment: Performed at [facility], [place] [address], [place], [place] [address]  CBC with Differential (PNL)     Status: Abnormal   Collection Time: 05/20/22  5:05 AM  Result Value Ref Range   WBC 10.7 (H) 4.0 - 10.5 K/uL   RBC 3.35 (L) 4.22 - 5.81 MIL/uL   Hemoglobin 9.5 (L) 13.0 - 17.0 g/dL   HCT 31.6 (L) 39.0 - 52.0 %   MCV 94.3 80.0 - 100.0 fL   MCH 28.4 26.0 - 34.0 pg   MCHC 30.1 30.0 - 36.0 g/dL   RDW 14.5 11.5 - 15.5 %   Platelets 389 150 - 400 K/uL   nRBC 0.0 0.0 - 0.2 %   Neutrophils Relative % 86 %   Neutro Abs 9.4 (H) 1.7 - 7.7 K/uL   Lymphocytes Relative 5 %   Lymphs Abs 0.5 (L) 0.7 - 4.0 K/uL   Monocytes Relative 7 %   Monocytes Absolute 0.7 0.1 - 1.0 K/uL   Eosinophils Relative 1 %   Eosinophils Absolute 0.1 0.0 - 0.5 K/uL   Basophils Relative 0 %   Basophils Absolute 0.0 0.0 - 0.1 K/uL   Immature Granulocytes 1 %   Abs Immature Granulocytes 0.05 0.00 - 0.07 K/uL    Comment: Performed at [facility], [place] [address] [place], [place] [address]  Blood gas, venous     Status: Abnormal   Collection Time: 05/20/22  5:05 AM  Result Value Ref Range   pH, Ven 7.32 7.25 - 7.43   pCO2, Ven 58 44 - 60 mmHg   pO2, Ven 31 (LL) 32 - 45 mmHg    Comment: CRITICAL RESULT CALLED TO, READ BACK BY AND VERIFIED WITH: LATITA, H., RN @ 0530 ON 05/20/22 BY LJM    Bicarbonate 29.9 (H) 20.0 - 28.0 mmol/L   Acid-Base Excess 3.0 (H) 0.0 - 2.0 mmol/L   O2 Saturation 45.2 %   Patient temperature 37.0     Comment: Performed at [facility], [place] [address],  [place], [place] [address]  Ethanol     Status: None   Collection Time: 05/20/22  5:05 AM  Result Value Ref Range   Alcohol, Ethyl (B) <10 <10 mg/dL    Comment: (NOTE) Lowest detectable limit for serum alcohol is 10 mg/dL.  For medical purposes only. Performed at [facility], [address] [place], [place] [address]   Comprehensive metabolic panel     Status: Abnormal   Collection Time: 05/20/22  5:05 AM  Result Value Ref Range   Sodium 137 135 - 145 mmol/L   Potassium 4.3 3.5 - 5.1 mmol/L   Chloride 99 98 - 111 mmol/L   CO2 26 22 - 32 mmol/L   Glucose, Bld 158 (H) 70 - 99 mg/dL    Comment: Glucose reference range applies only to samples taken after fasting for at least 8 hours.   BUN 47 (H) 6 - 20 mg/dL   Creatinine, Ser 8.47 (H) 0.61 - 1.24 mg/dL   Calcium 7.6 (L) 8.9 - 10.3 mg/dL   Total Protein 7.6 6.5 - 8.1 g/dL   Albumin 3.2 (L) 3.5 - 5.0 g/dL   AST 28 15 - 41 U/L   ALT 22 0 - 44 U/L   Alkaline Phosphatase 192 (H) 38 - 126 U/L   Total Bilirubin 0.5 0.3 - 1.2 mg/dL   GFR, Estimated 7 (L) >60 mL/min    Comment: (NOTE) Calculated using the CKD-EPI Creatinine Equation (2021)    Anion gap 12 5 - 15    Comment: Performed at [facility], [place] [address], [place], [place] [address]  Lipase, blood     Status: Abnormal   Collection Time: 05/20/22  5:05 AM  Result Value Ref Range   Lipase 83 (H) 11 - 51 U/L    Comment: Performed at [facility], [place] [address] [place], [place] [address]  Lactic acid, plasma     Status: None   Collection Time: 05/20/22  5:05 AM  Result Value Ref Range   Lactic Acid, Venous 1.1 0.5 - 1.9 mmol/L    Comment: Performed at [facility], [place] [address], [place], [place] [address]  Ginger Carne 8, ED     Status: Abnormal   Collection Time: 05/20/22  5:16 AM  Result Value Ref Range   Sodium 140 135 - 145 mmol/L   Potassium 4.4 3.5 - 5.1 mmol/L   Chloride 102 98 - 111 mmol/L    BUN 45 (H) 6 - 20 mg/dL   Creatinine, Ser 8.90 (H) 0.61 - 1.24 mg/dL   Glucose, Bld 159 (H) 70 - 99 mg/dL    Comment: Glucose reference range applies only to samples taken after fasting for at least 8 hours.   Calcium, Ion 1.00 (L) 1.15 - 1.40 mmol/L   TCO2 27 22 - 32 mmol/L   Hemoglobin 11.2 (L) 13.0 - 17.0 g/dL   HCT 33.0 (L) 39.0 - 52.0 %  Blood culture (routine x 2)     Status: None (Preliminary result)   Collection Time: 05/20/22 11:38 AM   Specimen: BLOOD LEFT FOREARM  Result Value Ref Range   Specimen Description      BLOOD LEFT FOREARM Performed at [facility], [place] [address] [place], [place] [address]    Special Requests      BOTTLES DRAWN AEROBIC AND ANAEROBIC Blood Culture adequate volume Performed at [place] [address] [place], [place] [address]    Culture PENDING    Report Status PENDING   CBG monitoring, ED     Status: Abnormal   Collection Time: 05/20/22 12:08 PM  Result Value Ref Range   Glucose-Capillary 240 (H) 70 - 99 mg/dL    Comment: Glucose reference range applies only to samples taken after fasting for at least 8 hours.  CBG monitoring, ED     Status: Abnormal   Collection Time: 05/20/22  3:24 PM  Result Value Ref Range   Glucose-Capillary 131 (H) 70 - 99 mg/dL    Comment: Glucose reference range applies only to samples taken after fasting for at least 8 hours.  CBG monitoring, ED     Status: Abnormal   Collection Time: 05/20/22  4:47 PM  Result Value Ref Range   Glucose-Capillary 205 (H) 70 - 99 mg/dL    Comment: Glucose reference range applies only to samples taken after fasting for at least 8 hours.   DG Chest Port 1 View  Result Date: 05/20/2022 CLINICAL DATA:  Fever and vomiting. EXAM: PORTABLE CHEST 1 VIEW COMPARISON:  May 12, 2022 FINDINGS: The cardiac silhouette is markedly enlarged and unchanged in size. Marked severity infiltrate is seen within the right lung base. There is no evidence of a pleural  effusion or pneumothorax. The visualized skeletal structures are unremarkable. IMPRESSION: Stable cardiomegaly with marked severity right basilar infiltrate. Electronically Signed   By: Virgina Norfolk M.D.   On: 05/20/2022 04:14    Pending Labs Unresulted Labs (From admission, onward)     Start     Ordered   05/21/22 0500  CBC  Tomorrow morning,   R        05/20/22 0751   05/21/22 0500  Comprehensive metabolic panel  Tomorrow morning,   R        05/20/22 0751   05/20/22 1005  Hemoglobin A1c  Add-on,   AD       Comments: To assess prior glycemic control    05/20/22 1004   05/20/22 0657  Resp panel by RT-PCR (RSV, Flu A&B, Covid) Anterior Nasal Swab  (Tier 2 - SymptomaticResp panel by RT-PCR (RSV, Flu A&B, Covid))  Once,   URGENT        05/20/22 0656   05/20/22 0438  Blood culture (routine x 2)  BLOOD CULTURE X 2,   R      05/20/22 0438   05/20/22 0438  Culture, blood (single)  (Undifferentiated -> Now sepsis confirmed (treatment and sepsis specific nursing orders))  ONCE - STAT,   STAT        05/20/22 0438   05/20/22 0354  Rapid urine drug screen (hospital performed)  (Diabetes Ketoacidosis (DKA))  ONCE - STAT,   STAT        05/20/22 0354   05/20/22 0353  Urinalysis, Routine w reflex microscopic  (Diabetes Ketoacidosis (DKA))  ONCE - STAT,   URGENT        05/20/22 0354            Vitals/Pain Today's Vitals   05/20/22 1300 05/20/22 1345 05/20/22 1450 05/20/22 1736  BP: (!) 163/95   (!) 149/101  Pulse: 79   85  Resp: 11   18  Temp:   99.1 F (37.3 C) 98.4 F (36.9 C)  TempSrc:   Oral Oral  SpO2: 100%   100%  Weight:    82 kg  Height:    5\' 7"  (1.702 m)  PainSc:  0-No pain  0-No pain    Isolation Precautions Airborne and Contact precautions  Medications Medications  levofloxacin (LEVAQUIN) IVPB 500 mg (has no administration in time range)  heparin injection 5,000 Units (5,000 Units Subcutaneous Given 05/20/22 1401)  acetaminophen (TYLENOL) tablet 650 mg (has no  administration in time range)    Or  acetaminophen (TYLENOL) suppository 650 mg (has no administration in  time range)  ondansetron (ZOFRAN) tablet 4 mg (has no administration in time range)    Or  ondansetron (ZOFRAN) injection 4 mg (has no administration in time range)  amLODipine (NORVASC) tablet 10 mg (10 mg Oral Patient Refused/Not Given 05/20/22 1125)  calcium acetate (PHOSLO) capsule 2,001 mg (2,001 mg Oral Given 05/20/22 1621)  sevelamer carbonate (RENVELA) tablet 1,600 mg (1,600 mg Oral Given 05/20/22 1620)  sodium bicarbonate tablet 650 mg (650 mg Oral Not Given 05/20/22 1203)  insulin aspart (novoLOG) injection 0-6 Units (2 Units Subcutaneous Given 05/20/22 1648)  ondansetron (ZOFRAN) injection 4 mg (4 mg Intravenous Given 05/20/22 0506)  pantoprazole (PROTONIX) injection 40 mg (40 mg Intravenous Given 05/20/22 0506)  0.9 %  sodium chloride infusion (0 mLs Intravenous Stopped 05/20/22 1410)  levofloxacin (LEVAQUIN) IVPB 750 mg (0 mg Intravenous Stopped 05/20/22 0719)  acetaminophen (TYLENOL) tablet 1,000 mg (1,000 mg Oral Given 05/20/22 0539)    Mobility walks with staff assist

## 2022-05-20 NOTE — ED Notes (Signed)
[facility] arrival. 

## 2022-05-20 NOTE — ED Provider Notes (Signed)
[facility] DEPT Provider Note  CSN: [id number] Arrival date & time: 05/20/22 0306  Chief Complaint(s) Emesis  HPI [name] is a 52 y.o. male with a past medical history listed below including hypertension, hyperlipidemia, diabetes, ESRD on dialysis TTS here for 1 week of cough and right-sided chest pain is worsened over the past several days.  Chest pain is there with coughing and deep breathing.  Patient is also developed nausea and blood-tinged sputum.  He is endorsing epigastric abdominal discomfort related to vomiting.  No fevers or chills.  He has baseline peripheral edema in the lower extremities.  States last had dialysis on 12/31 due to missing it the day prior.  The history is provided by the patient.    Past Medical History Past Medical History:  Diagnosis Date   Anxiety 02/2019   Anxiety 10/2019   Diabetes mellitus type II, uncontrolled 07/17/2006   Elevated alkaline phosphatase level 01/2019   ESSENTIAL HYPERTENSION 07/17/2006   Homelessness 10/13/2021   HYPERLIPIDEMIA 11/24/2008   Insomnia 02/2019   Left bundle branch block 02/22/2019   Lesion of lip 10/2019   Pneumonia 10/22/2011   Suicidal ideations 02/2019   Vitamin D deficiency 01/2019   Patient Active Problem List   Diagnosis Date Noted   CHF (congestive heart failure) ([place]) 11/15/2021   Nausea vomiting and diarrhea 11/07/2021   Homelessness 10/13/2021   Acute respiratory failure with hypoxia ([place]) 10/13/2021   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 ([place]) 09/28/2021   Aspiration pneumonia ([place]) 09/28/2021   Volume overload 04/12/2021   Mild protein-calorie malnutrition ([place]) 03/06/2021   Aftercare including intermittent dialysis ([place]) 02/27/2021   Allergy, unspecified, initial encounter 02/27/2021   Anemia due to chronic kidney disease 02/27/2021   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   Secondary hyperparathyroidism of renal origin ([place]) 02/27/2021    Shortness of breath 02/27/2021   Type 2 diabetes mellitus with other diabetic kidney complication ([place]) 09/38/1829   Hypertensive urgency 02/18/2021   Type 2 diabetes mellitus ([place]) 02/17/2021   Acute on chronic combined systolic and diastolic CHF (congestive heart failure) ([place]) 11/29/2020   Hypertensive crisis 11/27/2020   Stage 4 chronic kidney disease ([place]) 02/22/2020   Left ventricular dysfunction 04/27/2019   Hemoglobin A1C greater than 9%, indicating poor diabetic control 07/29/2018   LBBB (left bundle branch block)    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 Authorizing Provider  amLODipine (NORVASC) 10 MG tablet take 1 tablet by mouth daily 02/27/22     ASPIRIN LOW DOSE 81 MG tablet Take 81 mg by mouth daily. 03/26/22   [provider]  calcium acetate (PHOSLO) 667 MG capsule Take 2,001 mg by mouth 3 (three) times daily. 03/26/22   [provider]  escitalopram (LEXAPRO) 5 MG tablet Take 1 tablet (5 mg total) by mouth daily. 01/21/22 01/21/23  Ajibola, Ene A, NP  glucose blood test strip Use to check fasting blood sugar once daily. 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 02/26/21  [name], [name], MD  insulin detemir (LEVEMIR FLEXTOUCH) 100 UNIT/ML FlexPen Inject 35 units into the skin once a day 02/27/22     insulin glargine (LANTUS) 100 UNIT/ML Solostar Pen Inject 10 Units into the skin daily. May substitute with any other long-acting formulary. 05/13/21   [name], MD  insulin  glargine (LANTUS) 100 UNIT/ML Solostar Pen Inject 5 units into the skin twice a day with a meal 02/27/22     Insulin Pen Needle 32G X 4 MM MISC Use with insulin pens 02/26/21   [name], [name], MD  phenazopyridine (PYRIDIUM) 200 MG tablet Take 1 tablet (200 mg total) by mouth 3 (three) times daily. 03/02/22   Tacy Learn, PA-C  PROTONIX 40 MG tablet Take 40 mg by mouth daily. 03/19/22   [provider]  sevelamer carbonate (RENVELA) 800 MG tablet Take 2 tablet by mouth three times a day with meals and 1 tablet twice daily as needed with snacks 11/20/21     sitaGLIPtin (JANUVIA) 25 MG tablet take 1 tablet by mouth daily 02/27/22     sodium bicarbonate 650 MG tablet Take 650 mg by mouth daily. 03/19/22   [provider]  triamcinolone cream (KENALOG) 0.1 % Apply 1 Application topically 2 (two) times daily. 01/06/22   Nyoka Lint, PA-C  gabapentin (NEURONTIN) 300 MG capsule Take 1 capsule (300 mg total) by mouth 3 (three) times daily. Patient not taking: Reported on 07/27/2019 07/29/18 08/30/19  [name], FNP  sildenafil (VIAGRA) 25 MG tablet Take 1 tablet (25 mg total) by mouth as needed for erectile dysfunction. for erectile dysfunction. Patient not taking: Reported on 08/11/2020 10/13/19 11/27/20  [name], FNP                                                                                                                                    Allergies Sulfa antibiotics, Keflex [cephalexin], Other, and Bactrim [sulfamethoxazole-trimethoprim]  Review of Systems Review of Systems As noted in HPI  Physical Exam Vital Signs  I have reviewed the triage vital signs BP (!) 195/87 (BP Location: Left Arm)   Pulse (!) 121   Temp (!) 103 F (39.4 C) (Oral)   Resp 18   SpO2 94%   Physical Exam Vitals reviewed.  Constitutional:      General: He is not in acute distress.    Appearance: He is well-developed. He is not diaphoretic.  HENT:     Head: Normocephalic  and atraumatic.     Nose: Nose normal.  Eyes:     General: No scleral icterus.       Right eye: No discharge.        Left eye: No discharge.     Conjunctiva/sclera: Conjunctivae normal.     Pupils: Pupils are equal, round, and reactive to light.  Cardiovascular:     Rate and Rhythm: Normal rate and regular rhythm.  Heart sounds: No murmur heard.    No friction rub. No gallop.     Arteriovenous access: Right arteriovenous access is present. Pulmonary:     Effort: Pulmonary effort is normal. No respiratory distress.     Breath sounds: No stridor. Examination of the right-middle field reveals rales. Examination of the right-lower field reveals rales. Rales present.  Abdominal:     General: There is no distension.     Palpations: Abdomen is soft.     Tenderness: There is abdominal tenderness in the epigastric area and left upper quadrant.  Musculoskeletal:        General: No tenderness.     Cervical back: Normal range of motion and neck supple.     Right lower leg: Edema present.     Left lower leg: Edema present.  Skin:    General: Skin is warm and dry.     Findings: No erythema or rash.  Neurological:     Mental Status: He is alert and oriented to person, place, and time.     ED Results and Treatments Labs (all labs ordered are listed, but only abnormal results are displayed) Labs Reviewed  BETA-HYDROXYBUTYRIC ACID - Abnormal; Notable for the following components:      Result Value   Beta-Hydroxybutyric Acid <0.05 (*)    All other components within normal limits  CBC WITH DIFFERENTIAL/PLATELET - Abnormal; Notable for the following components:   WBC 10.7 (*)    RBC 3.35 (*)    Hemoglobin 9.5 (*)    HCT 31.6 (*)    Neutro Abs 9.4 (*)    Lymphs Abs 0.5 (*)    All other components within normal limits  BLOOD GAS, VENOUS - Abnormal; Notable for the following components:   pO2, Ven 31 (*)    Bicarbonate 29.9 (*)    Acid-Base Excess 3.0 (*)    All other components  within normal limits  COMPREHENSIVE METABOLIC PANEL - Abnormal; Notable for the following components:   Glucose, Bld 158 (*)    BUN 47 (*)    Creatinine, Ser 8.47 (*)    Calcium 7.6 (*)    Albumin 3.2 (*)    Alkaline Phosphatase 192 (*)    GFR, Estimated 7 (*)    All other components within normal limits  LIPASE, BLOOD - Abnormal; Notable for the following components:   Lipase 83 (*)    All other components within normal limits  CBG MONITORING, ED - Abnormal; Notable for the following components:   Glucose-Capillary 164 (*)    All other components within normal limits  I-STAT CHEM 8, ED - Abnormal; Notable for the following components:   BUN 45 (*)    Creatinine, Ser 8.90 (*)    Glucose, Bld 159 (*)    Calcium, Ion 1.00 (*)    Hemoglobin 11.2 (*)    HCT 33.0 (*)    All other components within normal limits  CULTURE, BLOOD (ROUTINE X 2)  CULTURE, BLOOD (ROUTINE X 2)  CULTURE, BLOOD (SINGLE)  RESP PANEL BY RT-PCR (RSV, FLU A&B, COVID)  [id number]  ETHANOL  LACTIC ACID, PLASMA  BETA-HYDROXYBUTYRIC ACID  BETA-HYDROXYBUTYRIC ACID  URINALYSIS, ROUTINE W REFLEX MICROSCOPIC  RAPID URINE DRUG SCREEN, HOSP PERFORMED  EKG  EKG Interpretation  Date/Time:  Monday May 20 2022 04:14:07 EST Ventricular Rate:  115 PR Interval:  131 QRS Duration: 144 QT Interval:  377 QTC Calculation: 522 R Axis:   -73 Text Interpretation: Sinus tachycardia LAE, consider biatrial enlargement Left bundle branch block No significant change was found Confirmed by Addison Lank [phone]) on 05/20/2022 4:36:29 AM       Radiology DG Chest Port 1 View  Result Date: 05/20/2022 CLINICAL DATA:  Fever and vomiting. EXAM: PORTABLE CHEST 1 VIEW COMPARISON:  May 12, 2022 FINDINGS: The cardiac silhouette is markedly enlarged and unchanged in size. Marked severity infiltrate is seen within the  right lung base. There is no evidence of a pleural effusion or pneumothorax. The visualized skeletal structures are unremarkable. IMPRESSION: Stable cardiomegaly with marked severity right basilar infiltrate. Electronically Signed   By: Virgina Norfolk M.D.   On: 05/20/2022 04:14    Medications Ordered in ED Medications  levofloxacin (LEVAQUIN) IVPB 750 mg (750 mg Intravenous New Bag/Given 05/20/22 0538)  levofloxacin (LEVAQUIN) IVPB 500 mg (has no administration in time range)  ondansetron (ZOFRAN) injection 4 mg (4 mg Intravenous Given 05/20/22 0506)  pantoprazole (PROTONIX) injection 40 mg (40 mg Intravenous Given 05/20/22 0506)  0.9 %  sodium chloride infusion ( Intravenous New Bag/Given 05/20/22 0506)  acetaminophen (TYLENOL) tablet 1,000 mg (1,000 mg Oral Given 05/20/22 0539)                                                                                                                                     Procedures .1-3 Lead EKG Interpretation  Performed by: Fatima Blank, MD Authorized by: Fatima Blank, MD     Interpretation: abnormal     ECG rate:  123   ECG rate assessment: tachycardic     Rhythm: sinus tachycardia     Ectopy: none     Conduction: normal   .Critical Care  Performed by: Fatima Blank, MD Authorized by: Fatima Blank, MD   Critical care provider statement:    Critical care time (minutes):  45   Critical care time was exclusive of:  Separately billable procedures and treating other patients   Critical care was necessary to treat or prevent imminent or life-threatening deterioration of the following conditions:  Sepsis   Critical care was time spent personally by me on the following activities:  Development of treatment plan with patient or surrogate, discussions with consultants, evaluation of patient's response to treatment, examination of patient, obtaining history from patient or surrogate, review of old charts, re-evaluation of  patient's condition, pulse oximetry, ordering and review of radiographic studies, ordering and review of laboratory studies and ordering and performing treatments and interventions   Care discussed with: admitting provider     (including critical care time)  Medical Decision Making / ED Course   Medical Decision Making Amount and/or Complexity of Data Reviewed Labs: ordered.  Decision-making details documented in ED Course. Radiology: ordered and independent interpretation performed. Decision-making details documented in ED Course. ECG/medicine tests: ordered and independent interpretation performed. Decision-making details documented in ED Course.  Risk OTC drugs. Prescription drug management. Decision regarding hospitalization.    Cough and right chest pain. Also with N/V and upper abd pain Noted to be febrile, tachycardic, and hypoxic in triage. DDx: PNA, viral infection, bacteremia. Gastritis, pancreatitis, biliary disease. Colitits, or other intraabdominal inflammatory/infectious symptoms. Will also assess for electrolyte or metabolic derangements such as DKA.  EKG with sinus tachycardia.  No other dysrhythmias.  No evidence of pericarditis. Chest x-ray with evidence of right lower lobe pneumonia.  Code sepsis was initiated and patient started on empiric antibiotics while rest of the workup is being obtained.  CBC with leukocytosis.  Stable hemoglobin. Metabolic panel without significant electrolyte derangements.  Mild hyperglycemia without evidence of DKA.  Renal function consistent with ESRD. No evidence of bili obstruction.  Slightly elevated lipase but not consistent with pancreatitis. Viral panel pending.  Will admit to medicine for further work up and management      Final Clinical Impression(s) / ED Diagnoses Final diagnoses:  Community acquired pneumonia of right lower lobe of lung  Sepsis with acute hypoxic respiratory failure without septic shock, due to  unspecified organism [facility])           This chart was dictated using voice recognition software.  Despite best efforts to proofread,  errors can occur which can change the documentation meaning.    Fatima Blank, MD 05/20/22 [phone]

## 2022-05-20 NOTE — H&P (Signed)
History and Physical    Patient: [name]:[id number] DOB: [birth year] DOA: 05/20/2022 DOS: the patient was seen and examined on 05/20/2022 PCP: [facility]  Patient coming from: Home  Chief Complaint:  Chief Complaint  Patient presents with   Emesis   HPI: MALYK GIROUARD is a 52 y.o. male with medical history significant of anxiety, depression, suicidal ideations, insomnia, homelessness, type II DM, elevated alkaline phosphatase level, hypertension, hyperlipidemia, left bundle branch block, history of pneumonia, vitamin D deficiency, ESRD on hemodialysis who came into the emergency department due to nausea, vomiting, chest and abdominal pain.  He describes the pain on his left sided lower chest wall and RUQ.  He had a fever over 103 degrees in triage.  He has been having occasional productive cough and mild dyspnea. He denied fever, chills, rhinorrhea, sore throat, wheezing or hemoptysis.  No palpitations, diaphoresis, PND, orthopnea or pitting edema of the lower extremities.  No abdominal pain, nausea, emesis, diarrhea, constipation, melena or hematochezia.  He urinates very little, but no flank pain, dysuria, frequency or hematuria.  No polyuria, polydipsia, polyphagia or blurred vision.   ED course: Initial vital signs were temperature 103 F, pulse 121, respiration 18, BP 195/87 mmHg O2 sat 94% on 2 L nasal cannula.  The patient received Levaquin 750 mg IVP x 1, acetaminophen 1000 mg p.o. x 1, ondansetron 4 mg IVP and pantoprazole 40 mg IVP.  Lab work: Venous blood gas with normal pH and pCO2.  pO2 is 31 mmHg.  Bicarbonate 29.9 and acid-base excess 3.0 mmol/L.  CBC 0 white count 10.7, hemoglobin 9.5 g/dL platelets 389.  Normal lactic acid, alcohol and b cough hotdog eta hydroxybutyric acid.  Lipase was 83.  CMP showed a glucose of 158, BUN 47, creatinine 8.47 and calcium 7.6 mg/dL.  Albumin is 3.2 g/dL and alkaline phosphatase 192.  The rest of the LFTs and electrolytes were  normal.  Stop portable 1 view chest radiograph shows stable cardiomegaly with moderate severity right basilar infiltrate.   Review of Systems: As mentioned in the history of present illness. All other systems reviewed and are negative. Past Medical History:  Diagnosis Date   Anxiety 02/2019   Anxiety 10/2019   Diabetes mellitus type II, uncontrolled 07/17/2006   Elevated alkaline phosphatase level 01/2019   ESSENTIAL HYPERTENSION 07/17/2006   Homelessness 10/13/2021   HYPERLIPIDEMIA 11/24/2008   Insomnia 02/2019   Left bundle branch block 02/22/2019   Lesion of lip 10/2019   Pneumonia 10/22/2011   Suicidal ideations 02/2019   Vitamin D deficiency 01/2019   Past Surgical History:  Procedure Laterality Date   ARM SURGERY     AV FISTULA PLACEMENT Right 02/20/2021   Procedure: RIGHT ARTERIOVENOUS GRAFT CREATION;  Surgeon: Angelia Mould, MD;  Location: [place];  Service: Vascular;  Laterality: Right;   INSERTION OF DIALYSIS CATHETER Left 02/20/2021   Procedure: INSERTION OF DIALYSIS CATHETER USING PALINDROME CATHETER;  Surgeon: Angelia Mould, MD;  Location: [facility];  Service: Vascular;  Laterality: Left;   IR AV DIALY SHUNT INTRO NEEDLE/INTRACATH INITIAL W/PTA/IMG RIGHT Right 11/06/2021   IR REMOVAL TUN CV CATH W/O FL  07/27/2021   IR US GUIDE VASC ACCESS RIGHT  11/06/2021   ULTRASOUND GUIDANCE FOR VASCULAR ACCESS  02/20/2021   Procedure: ULTRASOUND GUIDANCE FOR VASCULAR ACCESS;  Surgeon: Angelia Mould, MD;  Location: [place];  Service: Vascular;;   Social History:  reports that he has never smoked. He has never used smokeless tobacco.  He reports that he does not drink alcohol and does not use drugs.  Allergies  Allergen Reactions   Sulfa Antibiotics Rash    Severe rash.    Keflex [Cephalexin] Other (See Comments)    Patient with severe drug reaction including exfoliating skin rash and hypotension after being prescribed both cephalexin and sulfamethoxazole-trimethoprim  simultaneously. Favor SMX as much more likely culprit as patient had tolerated other cephalosporins in the past, but cannot say with complete certainty that this was not related to cephalexin.    Other Other (See Comments)    Patient states he is allergic to the TB test - unknown reaction during childhood    Bactrim [Sulfamethoxazole-Trimethoprim] Rash    Family History  Problem Relation Age of Onset   Hypertension Mother 77   Cancer Mother        bladder cancer   Alcohol abuse Father    Hypertension Brother     Prior to Admission medications   Medication Sig Start Date End Date Taking? Authorizing Provider  amLODipine (NORVASC) 10 MG tablet take 1 tablet by mouth daily 02/27/22     ASPIRIN LOW DOSE 81 MG tablet Take 81 mg by mouth daily. 03/26/22   [provider]  calcium acetate (PHOSLO) 667 MG capsule Take 2,001 mg by mouth 3 (three) times daily. 03/26/22   [provider]  escitalopram (LEXAPRO) 5 MG tablet Take 1 tablet (5 mg total) by mouth daily. 01/21/22 01/21/23  Ajibola, Ene A, NP  glucose blood test strip Use to check fasting blood sugar once daily. 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 02/26/21   [name], [name], MD  insulin detemir (LEVEMIR FLEXTOUCH) 100 UNIT/ML FlexPen Inject 35 units into the skin once a day 02/27/22     insulin glargine (LANTUS) 100 UNIT/ML Solostar Pen Inject 10 Units into the skin daily. May substitute with any other long-acting formulary. 05/13/21   [name], MD  insulin glargine (LANTUS) 100 UNIT/ML Solostar Pen Inject 5 units into the skin twice a day with a meal 02/27/22     Insulin Pen Needle 32G X 4 MM MISC Use with insulin pens 02/26/21   [name], [name], MD  phenazopyridine (PYRIDIUM) 200 MG tablet Take 1 tablet (200 mg total) by mouth 3 (three) times daily. 03/02/22   Tacy Learn, PA-C  PROTONIX 40 MG tablet Take 40 mg by mouth daily. 03/19/22   [provider]  sevelamer carbonate (RENVELA) 800 MG tablet Take 2 tablet by mouth three times a day with meals and 1 tablet twice daily as needed with snacks 11/20/21     sitaGLIPtin (JANUVIA) 25 MG tablet take 1 tablet by mouth daily 02/27/22     sodium bicarbonate 650 MG tablet Take 650 mg by mouth daily. 03/19/22   [provider]  triamcinolone cream (KENALOG) 0.1 % Apply 1 Application topically 2 (two) times daily. 01/06/22   Nyoka Lint, PA-C  gabapentin (NEURONTIN) 300 MG capsule Take 1 capsule (300 mg total) by mouth 3 (three) times daily. Patient not taking: Reported on 07/27/2019 07/29/18 08/30/19  [name], FNP  sildenafil (VIAGRA) 25 MG tablet Take 1 tablet (25 mg total) by mouth as needed for erectile dysfunction. for erectile dysfunction. Patient not taking: Reported on 08/11/2020 10/13/19 11/27/20  [name], FNP    Physical Exam:  Vitals:   05/20/22 0545 05/20/22 0600 05/20/22 0630 05/20/22 0704  BP: (!) 182/80 (!) 189/93 (!) 178/90   Pulse: (!) 107 (!) 105 (!) 102   Resp: (!) 21 17 17    Temp:    100.2 F (37.9 C)  TempSrc:    Oral  SpO2: 99% 99% 97%    Physical Exam Vitals and nursing note reviewed.  Constitutional:      General: He is awake.     Appearance: Normal appearance. He is not ill-appearing.  HENT:     Head: Normocephalic.     Nose: No rhinorrhea.     Mouth/Throat:     Mouth: Mucous membranes are moist.  Eyes:     General: No scleral icterus.    Pupils: Pupils are equal, round, and reactive to light.  Neck:     Vascular: No JVD.  Cardiovascular:     Rate and Rhythm: Normal rate and regular rhythm.     Heart sounds: S1 normal and S2 normal.     Comments: Right AV fistula with good thrill. Pulmonary:     Effort: Pulmonary effort is normal. No accessory muscle usage.     Breath sounds: Examination of the right-lower field reveals decreased breath  sounds and rales. Decreased breath sounds, rhonchi and rales present. No wheezing.  Abdominal:     General: Bowel sounds are normal. There is no distension.     Palpations: Abdomen is soft.     Tenderness: There is no abdominal tenderness. There is no guarding.  Musculoskeletal:     Cervical back: Neck supple.     Right lower leg: No edema.     Left lower leg: No edema.  Skin:    General: Skin is warm and dry.  Neurological:     General: No focal deficit present.     Mental Status: He is alert and oriented to person, place, and time.  Psychiatric:        Mood and Affect: Mood normal.        Behavior: Behavior is cooperative.     Data Reviewed:  Results are pending, will review when available.  11/27/2020 IMPRESSIONS    1. Left ventricular ejection fraction, by estimation, is 30 to 35%. The  left ventricle has moderately decreased function. The left ventricle  demonstrates global hypokinesis. There is severe concentric left  ventricular hypertrophy. Left ventricular  diastolic parameters are consistent with Grade I diastolic dysfunction  (impaired relaxation).   2. Right ventricular systolic function is normal. The right ventricular  size is normal. Mildly increased right ventricular wall thickness. There  is normal pulmonary artery systolic pressure.   3. Left atrial size was moderately dilated.   4. A small pericardial effusion is present. The pericardial effusion is  circumferential.   5. The mitral valve is grossly normal. Mild mitral valve regurgitation.   6. The aortic valve is tricuspid. There is mild calcification of the  aortic valve. There is mild thickening of the aortic valve. Aortic valve  regurgitation is not visualized. Mild aortic valve sclerosis is present,  with no evidence of aortic valve  stenosis.   7. The inferior vena cava is normal in size with greater than 50%  respiratory variability, suggesting right atrial pressure of 3 mmHg.   8. Severe,  concerntric LVH most likely secondary to severe HTN, however,  amyloidosis is also on the differential. Could consider CMR as out-patient  to assess further pending clinical suspicion.   Comparison(s): Compared to prior  TTE on 06/02/20, the LVEF is now  moderately reduced 30-35%.    Assessment and Plan: Principal Problem:   CAP (community acquired pneumonia) Admit to telemetry/inpatient. Continue supplemental oxygen. Scheduled and as needed bronchodilators. Continue levofloxacin per pharmacy. Check strep pneumoniae urinary antigen. Check sputum Gram stain, culture and sensitivity. Follow-up blood culture and sensitivity. Follow-up CBC and chemistry in the morning.  Active Problems:   ESRD (end stage renal disease) ([place]) Will consult nephrology tomorrow for dialysis. Continue Renvela 1600 mg p.o. 3 times daily. Continue PhosLo 2001 mg p.o. 3 times daily. Continue sodium bicarbonate 650 mg p.o. daily.    Anemia due to chronic kidney disease Monitor hematocrit hemoglobin. Erythropoietin as needed per nephrology orders.    Essential hypertension Continue amlodipine 10 mg p.o. daily. Monitor blood pressure closely.    Hyperlipidemia LDL goal <70 Not on medical therapy. Declined statin for now due to lack of financial means.    Type 2 diabetes mellitus (HCC) Carb modified diet. CBG monitoring before meals and bedtime.    Chronic combined systolic and diastolic congestive heart failure (HCC) No signs of decompensation. Monitor for volume overload symptoms.     Advance Care Planning:   Code Status: Full Code   Consults:   Family Communication:   Severity of Illness: The appropriate patient status for this patient is INPATIENT. Inpatient status is judged to be reasonable and necessary in order to provide the required intensity of service to ensure the patient's safety. The patient's presenting symptoms, physical exam findings, and initial radiographic and laboratory  data in the context of their chronic comorbidities is felt to place them at high risk for further clinical deterioration. Furthermore, it is not anticipated that the patient will be medically stable for discharge from the hospital within 2 midnights of admission.   * I certify that at the point of admission it is my clinical judgment that the patient will require inpatient hospital care spanning beyond 2 midnights from the point of admission due to high intensity of service, high risk for further deterioration and high frequency of surveillance required.*  Author: Reubin Milan, MD 05/20/2022 7:48 AM  For on call review [email or web address].   This document was prepared using Dragon voice recognition software and may contain some unintended transcription errors.

## 2022-05-20 NOTE — ED Notes (Signed)
Date and time results received: 05/20/22 5:29 AM  (use smartphrase ".now" to insert current time)  Test: Venous PO2 Critical Value: 31  Name of Provider Notified: [facility]   Orders Received? Or Actions Taken?: Orders Received - See Orders for details

## 2022-05-20 NOTE — ED Notes (Signed)
Patient to be transported to [facility], current BP 149/101 he refused morning BP medications, MD made aware

## 2022-05-20 NOTE — Progress Notes (Signed)
Pharmacy Antibiotic Note  [name] is a 52 y.o. male admitted on 05/20/2022 with emesis, chest pain and abdominal pai.Marland Kitchen  Pharmacy has been consulted to dose levaquin for CAP.  Plan: Levaquin 750 mg IV x 1 then 500mg  q48h Follow renal function and clinical course     Temp (24hrs), Avg:103 F (39.4 C), Min:103 F (39.4 C), Max:103 F (39.4 C)  Recent Labs  Lab 05/16/22 0027 05/17/22 0129  WBC 8.6  --   CREATININE 14.77* 9.10*    Estimated Creatinine Clearance: 10 mL/min (A) (by C-G formula based on SCr of 9.1 mg/dL (H)).    Allergies  Allergen Reactions   Sulfa Antibiotics Rash    Severe rash.    Keflex [Cephalexin] Other (See Comments)    Patient with severe drug reaction including exfoliating skin rash and hypotension after being prescribed both cephalexin and sulfamethoxazole-trimethoprim simultaneously. Favor SMX as much more likely culprit as patient had tolerated other cephalosporins in the past, but cannot say with complete certainty that this was not related to cephalexin.    Other Other (See Comments)    Patient states he is allergic to the TB test - unknown reaction during childhood    Bactrim [Sulfamethoxazole-Trimethoprim] Rash     Thank you for allowing pharmacy to be a part of this patient's care.  Dolly Rias RPh 05/20/2022, 4:47 AM

## 2022-05-20 NOTE — Sepsis Progress Note (Signed)
Elink following code sepsis °

## 2022-05-20 NOTE — ED Notes (Signed)
Pt has been refusing to keep vital sign monitoring devices on.

## 2022-05-20 NOTE — ED Notes (Signed)
Patient BP was reading 30/11 RN went into room upon assessment RN noticed BP cuff was removed from patient. RN attached BP cuff and received a reading of 145/68.  Patient A&O, NAD and denies pain or discomfort a this time.

## 2022-05-20 NOTE — ED Notes (Signed)
Attempted straight stick for 2nd set of cultures but unable to obtain before 73m window to start antibiotics. First set collected prior to starting antibiotics.

## 2022-05-20 NOTE — ED Notes (Signed)
[facility] called for transport 

## 2022-05-21 ENCOUNTER — Inpatient Hospital Stay (HOSPITAL_COMMUNITY): Payer: Medicaid Other

## 2022-05-21 DIAGNOSIS — J189 Pneumonia, unspecified organism: Secondary | ICD-10-CM | POA: Diagnosis not present

## 2022-05-21 LAB — CBC
HCT: 25.2 % — ABNORMAL LOW (ref 39.0–52.0)
Hemoglobin: 8.1 g/dL — ABNORMAL LOW (ref 13.0–17.0)
MCH: 28.7 pg (ref 26.0–34.0)
MCHC: 32.1 g/dL (ref 30.0–36.0)
MCV: 89.4 fL (ref 80.0–100.0)
Platelets: 266 10*3/uL (ref 150–400)
RBC: 2.82 MIL/uL — ABNORMAL LOW (ref 4.22–5.81)
RDW: 14.2 % (ref 11.5–15.5)
WBC: 9.2 10*3/uL (ref 4.0–10.5)
nRBC: 0 % (ref 0.0–0.2)

## 2022-05-21 LAB — COMPREHENSIVE METABOLIC PANEL
ALT: 19 U/L (ref 0–44)
AST: 19 U/L (ref 15–41)
Albumin: 2.4 g/dL — ABNORMAL LOW (ref 3.5–5.0)
Alkaline Phosphatase: 131 U/L — ABNORMAL HIGH (ref 38–126)
Anion gap: 15 (ref 5–15)
BUN: 59 mg/dL — ABNORMAL HIGH (ref 6–20)
CO2: 24 mmol/L (ref 22–32)
Calcium: 8 mg/dL — ABNORMAL LOW (ref 8.9–10.3)
Chloride: 97 mmol/L — ABNORMAL LOW (ref 98–111)
Creatinine, Ser: 9.42 mg/dL — ABNORMAL HIGH (ref 0.61–1.24)
GFR, Estimated: 6 mL/min — ABNORMAL LOW (ref >60)
Glucose, Bld: 107 mg/dL — ABNORMAL HIGH (ref 70–99)
Potassium: 4.7 mmol/L (ref 3.5–5.1)
Sodium: 136 mmol/L (ref 135–145)
Total Bilirubin: 0.6 mg/dL (ref 0.3–1.2)
Total Protein: 6.1 g/dL — ABNORMAL LOW (ref 6.5–8.1)

## 2022-05-21 LAB — BLOOD CULTURE ID PANEL (REFLEXED) - BCID2

## 2022-05-21 LAB — HEPATITIS B SURFACE ANTIGEN: Hepatitis B Surface Ag: NONREACTIVE

## 2022-05-21 LAB — HEMOGLOBIN A1C
Hgb A1c MFr Bld: 8.2 % — ABNORMAL HIGH (ref 4.8–5.6)
Mean Plasma Glucose: 189 mg/dL

## 2022-05-21 LAB — C-REACTIVE PROTEIN: CRP: 12.6 mg/dL — ABNORMAL HIGH (ref <1.0)

## 2022-05-21 LAB — BRAIN NATRIURETIC PEPTIDE: B Natriuretic Peptide: >4500 pg/mL — ABNORMAL HIGH (ref 0.0–100.0)

## 2022-05-21 LAB — GLUCOSE, CAPILLARY
Glucose-Capillary: 131 mg/dL — ABNORMAL HIGH (ref 70–99)
Glucose-Capillary: 128 mg/dL — ABNORMAL HIGH (ref 70–99)
Glucose-Capillary: 108 mg/dL — ABNORMAL HIGH (ref 70–99)
Glucose-Capillary: 111 mg/dL — ABNORMAL HIGH (ref 70–99)

## 2022-05-21 MED ORDER — PENTAFLUOROPROP-TETRAFLUOROETH EX AERO: 1.0000 | INHALATION_SPRAY | CUTANEOUS | Status: DC | PRN

## 2022-05-21 MED ORDER — HEPARIN SODIUM (PORCINE) 1000 UNIT/ML DIALYSIS
1000.0000 [IU] | INTRAMUSCULAR | Status: DC | PRN
  Administered 2022-05-21: 2000 [IU] via INTRAVENOUS_CENTRAL
  Filled 2022-05-21: qty 1

## 2022-05-21 MED ORDER — ALTEPLASE 2 MG IJ SOLR: 2.0000 mg | Freq: Once | INTRAMUSCULAR | Status: DC | PRN

## 2022-05-21 MED ORDER — ACETAMINOPHEN 500 MG PO TABS
500.0000 mg | ORAL_TABLET | Freq: Four times a day (QID) | ORAL | Status: DC | PRN
  Administered 2022-05-26 (×2): 500 mg via ORAL
  Filled 2022-05-21 (×2): qty 1

## 2022-05-21 MED ORDER — LIDOCAINE-PRILOCAINE 2.5-2.5 % EX CREA
1.0000 | TOPICAL_CREAM | CUTANEOUS | Status: DC | PRN
  Filled 2022-05-21: qty 5

## 2022-05-21 MED ORDER — MORPHINE SULFATE (PF) 2 MG/ML IV SOLN
1.0000 mg | INTRAVENOUS | Status: DC | PRN
  Administered 2022-05-21 (×2): 1 mg via INTRAVENOUS
  Filled 2022-05-21 (×2): qty 1

## 2022-05-21 MED ORDER — FAMOTIDINE 20 MG PO TABS
20.0000 mg | ORAL_TABLET | Freq: Every day | ORAL | Status: DC
  Administered 2022-05-22 – 2022-05-27 (×5): 20 mg via ORAL
  Filled 2022-05-21 (×6): qty 1

## 2022-05-21 MED ORDER — CHLORHEXIDINE GLUCONATE CLOTH 2 % EX PADS
6.0000 | MEDICATED_PAD | Freq: Every day | CUTANEOUS | Status: DC
  Administered 2022-05-22 – 2022-05-26 (×4): 6 via TOPICAL

## 2022-05-21 MED ORDER — CARVEDILOL 3.125 MG PO TABS
3.1250 mg | ORAL_TABLET | Freq: Two times a day (BID) | ORAL | Status: DC
  Administered 2022-05-21 – 2022-05-27 (×11): 3.125 mg via ORAL
  Filled 2022-05-21 (×12): qty 1

## 2022-05-21 MED ORDER — SODIUM CHLORIDE 0.9 % IV SOLN
3.0000 g | INTRAVENOUS | Status: DC
  Administered 2022-05-21 – 2022-05-24 (×4): 3 g via INTRAVENOUS
  Filled 2022-05-21 (×4): qty 8

## 2022-05-21 MED ORDER — NEPRO/CARBSTEADY PO LIQD
237.0000 mL | Freq: Three times a day (TID) | ORAL | Status: DC
  Administered 2022-05-22 – 2022-05-27 (×10): 237 mL via ORAL

## 2022-05-21 MED ORDER — KETOROLAC TROMETHAMINE 30 MG/ML IJ SOLN
30.0000 mg | Freq: Once | INTRAMUSCULAR | Status: AC
  Administered 2022-05-21: 30 mg via INTRAVENOUS
  Filled 2022-05-21: qty 1

## 2022-05-21 MED ORDER — ISOSORB DINITRATE-HYDRALAZINE 20-37.5 MG PO TABS
1.0000 | ORAL_TABLET | Freq: Three times a day (TID) | ORAL | Status: DC
  Administered 2022-05-21 – 2022-05-27 (×18): 1 via ORAL
  Filled 2022-05-21 (×18): qty 1

## 2022-05-21 MED ORDER — HYDROCODONE-ACETAMINOPHEN 5-325 MG PO TABS
1.0000 | ORAL_TABLET | Freq: Four times a day (QID) | ORAL | Status: DC | PRN
  Administered 2022-05-22 – 2022-05-23 (×2): 2 via ORAL
  Administered 2022-05-25 – 2022-05-26 (×2): 1 via ORAL
  Administered 2022-05-28: 2 via ORAL
  Filled 2022-05-21 (×2): qty 2
  Filled 2022-05-21 (×2): qty 1
  Filled 2022-05-21: qty 2

## 2022-05-21 MED ORDER — DARBEPOETIN ALFA 100 MCG/0.5ML IJ SOSY
100.0000 ug | PREFILLED_SYRINGE | INTRAMUSCULAR | Status: DC
  Administered 2022-05-23: 100 ug via SUBCUTANEOUS
  Filled 2022-05-21 (×3): qty 0.5

## 2022-05-21 MED ORDER — LIDOCAINE HCL (PF) 1 % IJ SOLN
5.0000 mL | INTRAMUSCULAR | Status: DC | PRN
  Filled 2022-05-21: qty 5

## 2022-05-21 NOTE — Progress Notes (Signed)
PHARMACY - PHYSICIAN COMMUNICATION CRITICAL VALUE ALERT - BLOOD CULTURE IDENTIFICATION (BCID)  [name] is an 52 y.o. male who presented to [facility] on 05/20/2022 with a chief complaint of emesis  Assessment:  Patient on levofloxacin for CAP, found to have a positive blood culture for strep pneumo  Name of physician (or Provider) Contacted: Dr. Candiss Norse  Current antibiotics: Levofloxacin  Changes to prescribed antibiotics recommended: Patient has a cephalosporin allergy, not tolerating levofloxacin well due to emesis.  MD changing to Unasyn today for 10 days which should cover this organism as well.   Results for orders placed or performed during the hospital encounter of 05/20/22  Blood Culture ID Panel (Reflexed) (Collected: 05/20/2022  5:29 AM)  Result Value Ref Range   Enterococcus faecalis NOT DETECTED NOT DETECTED   Enterococcus Faecium NOT DETECTED NOT DETECTED   Listeria monocytogenes NOT DETECTED NOT DETECTED   Staphylococcus species NOT DETECTED NOT DETECTED   Staphylococcus aureus (BCID) NOT DETECTED NOT DETECTED   Staphylococcus epidermidis NOT DETECTED NOT DETECTED   Staphylococcus lugdunensis NOT DETECTED NOT DETECTED   Streptococcus species DETECTED (A) NOT DETECTED   Streptococcus agalactiae NOT DETECTED NOT DETECTED   Streptococcus pneumoniae DETECTED (A) NOT DETECTED   Streptococcus pyogenes NOT DETECTED NOT DETECTED   A.calcoaceticus-baumannii NOT DETECTED NOT DETECTED   Bacteroides fragilis NOT DETECTED NOT DETECTED   Enterobacterales NOT DETECTED NOT DETECTED   Enterobacter cloacae complex NOT DETECTED NOT DETECTED   Escherichia coli NOT DETECTED NOT DETECTED   Klebsiella aerogenes NOT DETECTED NOT DETECTED   Klebsiella oxytoca NOT DETECTED NOT DETECTED   Klebsiella pneumoniae NOT DETECTED NOT DETECTED   Proteus species NOT DETECTED NOT DETECTED   Salmonella species NOT DETECTED NOT DETECTED   Serratia marcescens NOT DETECTED NOT DETECTED   Haemophilus  influenzae NOT DETECTED NOT DETECTED   Neisseria meningitidis NOT DETECTED NOT DETECTED   Pseudomonas aeruginosa NOT DETECTED NOT DETECTED   Stenotrophomonas maltophilia NOT DETECTED NOT DETECTED   Candida albicans NOT DETECTED NOT DETECTED   Candida auris NOT DETECTED NOT DETECTED   Candida glabrata NOT DETECTED NOT DETECTED   Candida krusei NOT DETECTED NOT DETECTED   Candida parapsilosis NOT DETECTED NOT DETECTED   Candida tropicalis NOT DETECTED NOT DETECTED   Cryptococcus neoformans/gattii NOT DETECTED NOT DETECTED    [name] 05/21/2022  10:26 AM

## 2022-05-21 NOTE — Progress Notes (Signed)
Patient seen.  Complains of right-sided back pain that has been present for over a week.  Pain secondary to pneumonia.  No significant shortness of breath.  Last dialyzed on Sunday.  Morphine.  Patient ordered

## 2022-05-21 NOTE — Consult Note (Addendum)
[facility] Renal Consultation Note    Indication for Consultation:  Management of ESRD/hemodialysis; anemia, hypertension/volume and secondary hyperparathyroidism  RCB:[facility], [facility]  HPI: [name] is a 52 y.o. male with ESRD on HD TTS at [facility]. He has a past medical history significant for anxiety, depression, suicidal ideations, insomnia, homelessness, type II DM, elevated alkaline phosphatase level, hypertension, hyperlipidemia, left bundle branch block, history of pneumonia, vitamin D deficiency, and HD non-compliance.  Patient presented to the ED c/o CP, N/V, and ABD pain. Per medical records, he claims being diagnosed with COVID-19 10 days ago. Patient found to have PNA and currently admitted. Seen and examined patient at bedside. Currently sitting up eating breakfast. Denies SOB/CP and N/V. Currently on 2L O2 and Bps elevated. On ABXs. Reviewed outpatient HD records. Noted ongoing HD non-compliance and last HD on 05/19/22. At one point, patient moved to [facility] but reports recently relocating back to [place]. Notable labs include: SrCr 9.4, BUN 59, K+ 4.7, Na 136, Ca 8, Albumin 2.4, BNP > 4500, CRP 12.6, and Hgb 8.1. Plan to resume HD tomorrow 05/22/22 due to high patient census and low HD staffing. He tells me he is now homeless but is not planning on staying here past Thursday -  is not happy that we cannot do HD today   Past Medical History:  Diagnosis Date   Anxiety 02/2019   Anxiety 10/2019   Diabetes mellitus type II, uncontrolled 07/17/2006   Elevated alkaline phosphatase level 01/2019   ESSENTIAL HYPERTENSION 07/17/2006   Homelessness 10/13/2021   HYPERLIPIDEMIA 11/24/2008   Insomnia 02/2019   Left bundle branch block 02/22/2019   Lesion of lip 10/2019   Pneumonia 10/22/2011   Suicidal ideations 02/2019   Vitamin D deficiency 01/2019   Past Surgical History:  Procedure Laterality Date   ARM SURGERY     AV FISTULA PLACEMENT Right  02/20/2021   Procedure: RIGHT ARTERIOVENOUS GRAFT CREATION;  Surgeon: Angelia Mould, MD;  Location: [place];  Service: Vascular;  Laterality: Right;   INSERTION OF DIALYSIS CATHETER Left 02/20/2021   Procedure: INSERTION OF DIALYSIS CATHETER USING PALINDROME CATHETER;  Surgeon: Angelia Mould, MD;  Location: [facility] OR;  Service: Vascular;  Laterality: Left;   IR AV DIALY SHUNT INTRO NEEDLE/INTRACATH INITIAL W/PTA/IMG RIGHT Right 11/06/2021   IR REMOVAL TUN CV CATH W/O FL  07/27/2021   IR US GUIDE VASC ACCESS RIGHT  11/06/2021   ULTRASOUND GUIDANCE FOR VASCULAR ACCESS  02/20/2021   Procedure: ULTRASOUND GUIDANCE FOR VASCULAR ACCESS;  Surgeon: Angelia Mould, MD;  Location: [facility] OR;  Service: Vascular;;   Family History  Problem Relation Age of Onset   Hypertension Mother 11   Cancer Mother        bladder cancer   Alcohol abuse Father    Hypertension Brother    Social History:  reports that he has never smoked. He has never used smokeless tobacco. He reports that he does not drink alcohol and does not use drugs. Allergies  Allergen Reactions   Sulfa Antibiotics Rash    Severe rash.    Keflex [Cephalexin] Other (See Comments)    Patient with severe drug reaction including exfoliating skin rash and hypotension after being prescribed both cephalexin and sulfamethoxazole-trimethoprim simultaneously. Favor SMX as much more likely culprit as patient had tolerated other cephalosporins in the past, but cannot say with complete certainty that this was not related to cephalexin.    Other Other (See Comments)  Patient states he is allergic to the TB test - unknown reaction during childhood    Bactrim [Sulfamethoxazole-Trimethoprim] Rash   Prior to Admission medications   Medication Sig Start Date End Date Taking? Authorizing Provider  amLODipine (NORVASC) 10 MG tablet take 1 tablet by mouth daily Patient not taking: Reported on 05/20/2022 02/27/22     ASPIRIN LOW DOSE 81 MG tablet  Take 81 mg by mouth daily. Patient not taking: Reported on 05/20/2022 03/26/22   [provider]  calcium acetate (PHOSLO) 667 MG capsule Take 2,001 mg by mouth 3 (three) times daily. Patient not taking: Reported on 05/20/2022 03/26/22   [provider]  escitalopram (LEXAPRO) 5 MG tablet Take 1 tablet (5 mg total) by mouth daily. Patient not taking: Reported on 05/20/2022 01/21/22 01/21/23  Leandro Reasoner A, NP  glucose blood test strip Use to check fasting blood sugar once daily. 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 Patient not taking: Reported on 05/20/2022 02/26/21   [name], [name], MD  insulin detemir (LEVEMIR FLEXTOUCH) 100 UNIT/ML FlexPen Inject 35 units into the skin once a day Patient not taking: Reported on 05/20/2022 02/27/22     insulin glargine (LANTUS) 100 UNIT/ML Solostar Pen Inject 5 units into the skin twice a day with a meal Patient not taking: Reported on 05/20/2022 02/27/22     Insulin Pen Needle 32G X 4 MM MISC Use with insulin pens 02/26/21   [name], [name], MD  phenazopyridine (PYRIDIUM) 200 MG tablet Take 1 tablet (200 mg total) by mouth 3 (three) times daily. Patient not taking: Reported on 05/20/2022 03/02/22   Suella Broad A, PA-C  PROTONIX 40 MG tablet Take 40 mg by mouth daily. Patient not taking: Reported on 05/20/2022 03/19/22   [provider]  sevelamer carbonate (RENVELA) 800 MG tablet Take 2 tablet by mouth three times a day with meals and 1 tablet twice daily as needed with snacks Patient not taking: Reported on 05/20/2022 11/20/21     sitaGLIPtin (JANUVIA) 25 MG tablet take 1 tablet by mouth daily Patient not taking: Reported on 05/20/2022 02/27/22     sodium bicarbonate 650 MG tablet Take 650 mg by mouth daily. Patient not taking: Reported on 05/20/2022 03/19/22   [provider]  triamcinolone  cream (KENALOG) 0.1 % Apply 1 Application topically 2 (two) times daily. Patient not taking: Reported on 05/20/2022 01/06/22   Nyoka Lint, PA-C  gabapentin (NEURONTIN) 300 MG capsule Take 1 capsule (300 mg total) by mouth 3 (three) times daily. Patient not taking: Reported on 07/27/2019 07/29/18 08/30/19  [name], FNP  sildenafil (VIAGRA) 25 MG tablet Take 1 tablet (25 mg total) by mouth as needed for erectile dysfunction. for erectile dysfunction. Patient not taking: Reported on 08/11/2020 10/13/19 11/27/20  [name], FNP   Current Facility-Administered Medications  Medication Dose Route Frequency Provider Last Rate Last Admin   acetaminophen (TYLENOL) tablet 500 mg  500 mg Oral Q6H PRN Thurnell Lose, MD       alteplase (CATHFLO ACTIVASE) injection 2 mg  2 mg Intracatheter Once PRN Adelfa Koh, NP       Ampicillin-Sulbactam (UNASYN) 3 g in sodium chloride 0.9 % 100 mL IVPB  3 g Intravenous Q24 Hr x 5 Singh, Margaree Mackintosh, MD       calcium acetate (PHOSLO)  capsule 2,001 mg  2,001 mg Oral TID Reubin Milan, MD   2,001 mg at 05/21/22 1011   carvedilol (COREG) tablet 3.125 mg  3.125 mg Oral BID WC Thurnell Lose, MD       Chlorhexidine Gluconate Cloth 2 % PADS 6 each  6 each Topical Q0600 Adelfa Koh, NP       famotidine (PEPCID) tablet 20 mg  20 mg Oral Daily Thurnell Lose, MD       heparin injection 1,000 Units  1,000 Units Dialysis PRN Adelfa Koh, NP       heparin injection 5,000 Units  5,000 Units Subcutaneous Q8H Reubin Milan, MD   5,000 Units at 05/21/22 4627   HYDROcodone-acetaminophen (NORCO/VICODIN) 5-325 MG per tablet 1-2 tablet  1-2 tablet Oral Q6H PRN Thurnell Lose, MD       insulin aspart (novoLOG) injection 0-6 Units  0-6 Units Subcutaneous TID WC Reubin Milan, MD   2 Units at 05/20/22 1648   isosorbide-hydrALAZINE (BIDIL) 20-37.5 MG per tablet 1 tablet  1 tablet Oral TID Thurnell Lose, MD       lidocaine (PF)  (XYLOCAINE) 1 % injection 5 mL  5 mL Intradermal PRN Adelfa Koh, NP       lidocaine-prilocaine (EMLA) cream 1 Application  1 Application Topical PRN Adelfa Koh, NP       ondansetron [facility]) tablet 4 mg  4 mg Oral Q6H PRN Reubin Milan, MD       Or   ondansetron [facility]) injection 4 mg  4 mg Intravenous Q6H PRN Reubin Milan, MD       pentafluoroprop-tetrafluoroeth (GEBAUERS) aerosol 1 Application  1 Application Topical PRN Adelfa Koh, NP       sevelamer carbonate (RENVELA) tablet 1,600 mg  1,600 mg Oral TID WC Reubin Milan, MD   1,600 mg at 05/21/22 1142   sodium bicarbonate tablet 650 mg  650 mg Oral Daily Reubin Milan, MD       Labs: Basic Metabolic Panel: Recent Labs  Lab 05/17/22 0129 05/20/22 0505 05/20/22 0516 05/21/22 0457  NA 140 137 140 136  K 4.4 4.3 4.4 4.7  CL 102 99 102 97*  CO2 25 26  --  24  GLUCOSE 150* 158* 159* 107*  BUN 56* 47* 45* 59*  CREATININE 9.10* 8.47* 8.90* 9.42*  CALCIUM 7.4* 7.6*  --  8.0*   Liver Function Tests: Recent Labs  Lab 05/16/22 0027 05/20/22 0505 05/21/22 0457  AST 17 28 19   ALT 14 22 19   ALKPHOS 151* 192* 131*  BILITOT 0.4 0.5 0.6  PROT 7.2 7.6 6.1*  ALBUMIN 2.8* 3.2* 2.4*   Recent Labs  Lab 05/16/22 0027 05/20/22 0505  LIPASE 51 83*   No results for input(s): "AMMONIA" in the last 168 hours. CBC: Recent Labs  Lab 05/16/22 0027 05/20/22 0505 05/20/22 0516 05/21/22 0457  WBC 8.6 10.7*  --  9.2  NEUTROABS 6.3 9.4*  --   --   HGB 9.8* 9.5* 11.2* 8.1*  HCT 30.8* 31.6* 33.0* 25.2*  MCV 91.1 94.3  --  89.4  PLT 456* 389  --  266   Cardiac Enzymes: No results for input(s): "CKTOTAL", "CKMB", "CKMBINDEX", "TROPONINI" in the last 168 hours. CBG: Recent Labs  Lab 05/20/22 1208 05/20/22 1524 05/20/22 1647 05/21/22 0946 05/21/22 1214  GLUCAP 240* 131* 205* 131* 128*   Iron Studies: No results for input(s): "IRON", "TIBC", "TRANSFERRIN", "  FERRITIN" in the last  72 hours. Studies/Results: DG Chest Port 1 View  Result Date: 05/21/2022 CLINICAL DATA:  Shortness of breath. EXAM: PORTABLE CHEST 1 VIEW COMPARISON:  05/20/2022 FINDINGS: The cardio pericardial silhouette is enlarged. Persistent collapse/consolidation noted right lower lung with worsening atelectasis or infiltrate at the left base. Vascular congestion noted without overt airspace pulmonary edema. Bones are diffusely demineralized. Telemetry leads overlie the chest. IMPRESSION: Persistent collapse/consolidation right lower lung with worsening atelectasis or infiltrate at the left base. Electronically Signed   By: Misty Stanley M.D.   On: 05/21/2022 06:42   DG Chest Port 1 View  Result Date: 05/20/2022 CLINICAL DATA:  Fever and vomiting. EXAM: PORTABLE CHEST 1 VIEW COMPARISON:  May 12, 2022 FINDINGS: The cardiac silhouette is markedly enlarged and unchanged in size. Marked severity infiltrate is seen within the right lung base. There is no evidence of a pleural effusion or pneumothorax. The visualized skeletal structures are unremarkable. IMPRESSION: Stable cardiomegaly with marked severity right basilar infiltrate. Electronically Signed   By: Virgina Norfolk M.D.   On: 05/20/2022 04:14    ROS: All others negative except those listed in HPI.  Physical Exam: Vitals:   05/20/22 2059 05/20/22 2308 05/21/22 0607 05/21/22 1015  BP: (!) 156/91 (!) 171/89 (!) 162/97 (!) 158/87  Pulse: 87 85 76 80  Resp: 17 20 18 18   Temp: 97.9 F (36.6 C)  97.7 F (36.5 C)   TempSrc:   Oral   SpO2: 100% 100% 100% 100%  Weight:      Height:         General: Awake, alert, on 2L, NAD Head: NCAT sclera not icteric  Lungs: Rales/coarse R low-mid lobes, No wheeze or rhonchi. Breathing is unlabored. Heart: RRR. No murmur, rubs or gallops.  Abdomen: soft, non-tender Lower extremities: 1-2+ LE edema Neuro: AAOx3. Moves all extremities spontaneously. Dialysis Access: R AVG (+) B/T  Dialysis Orders:  TTS -  [facility] 4hrs, BFR 400, DFR 800,  EDW 61.5kg, 2K/ 2.5Ca Heparin 2000 units with HD No VDRA/ESA ordered  Assessment/Plan: CAP - managed by primary. On ABXs and repeating resp virus panel; CTA ordered since inflammatory markers elevated and r/o PE ESRD - on HD TTS but now off schedule d/t high census and low staffing. No emergent need for HD today. Plan for HD tomorrow 05/22/22. Will stop PO bicarb since patient is on dialysis. We likely will need to run him Wed and Thursday to get back on schedule given his volume overload and his tendency to miss OP HD  Hypertension/volume  - Bps elevated, more likely 2nd volume overload, goal to improve with HD Anemia of CKD - Hgb 8.1, will order iron studies and will order ESA here. No Fe while he is on ABXs. Secondary Hyperparathyroidism -  Corr Ca okay. Last PTH 382 from 05/16/22 in outpatient, checking PO4 in AM. Continue binders. Nutrition - Renal diet with fluid restriction, will add protein supplements  Tobie Poet, NP [facility] 05/21/2022, 1:26 PM    Patient seen and examined, agree with above note with above modifications. Well known to [place] due to his noncompliance of dialysis and frequent hospitalizations.  He tells me that he is now homeless -  was apparently on O2 as and OP previously when he was living with his Mom.  Last HD was on 12/31-  unfortunately we do not have the staff today to run him on schedule-  plan will be HD tomorrow-  then may also need on  Thursday.  Attempt to treat his anemia  Corliss Parish, MD 05/21/2022

## 2022-05-21 NOTE — Progress Notes (Signed)
On entering the room patient found to have emesis all over himself and the floor. Refusing clean up. Refusing respiratory panel testing and refusing morning medications. MD notified.

## 2022-05-21 NOTE — Progress Notes (Signed)
Pt receives out-pt HD at [facility]) on TTS. Pt arrives at 6:20 am for 6:40 am chair time. Will assist as needed.   Melven Sartorius Renal Navigator [phone]

## 2022-05-21 NOTE — Progress Notes (Addendum)
  Transition of Care (TOC) Screening Note   Patient Details  Name: [name] Date of Birth: [birth year]   Transition of Care [facility]) CM/SW Contact:    Benard Halsted, LCSW Phone Number: 05/21/2022, 9:58 AM    Transition of Care Department [facility]) has reviewed patient and upon further chart review, patient with history of being unstably housed and frequently misses dialysis treatments. The [facility] and HF SW have completed pallet home applications for patient. CSW emailed the [employer] program to get an update on the referral. Patient's social security disability has been approved. Added additional resources to patient's AVS if needed, though patient is familiar with resources. We will continue to monitor patient advancement through interdisciplinary progression rounds. If new patient transition needs arise, please place a TOC consult.

## 2022-05-21 NOTE — Progress Notes (Signed)
PROGRESS NOTE                                                                                                                                                                                                             Patient Demographics:    [name], is a 52 y.o. male, DOB - [birth year], MCN:[id number]  Outpatient Primary MD for the patient is [facility]    LOS - 1  Admit date - 05/20/2022    Chief Complaint  Patient presents with   Emesis       Brief Narrative (HPI from H&P)    52 y.o. male with medical history significant of anxiety, depression, suicidal ideations, insomnia, homelessness, type II DM, elevated alkaline phosphatase level, hypertension, hyperlipidemia, left bundle branch block, history of pneumonia, vitamin D deficiency, ESRD on hemodialysis who came into the emergency department due to nausea, vomiting, RLL chest and abdominal pain.  Having pain in the right lower chest which is pleuritic in nature worse with deep breaths and cough along with fever for the last 7 to 8 days, he claims he was diagnosed with COVID about 10 days ago, came to the hospital where he was diagnosed with pneumonia and admitted to the hospital.   Subjective:    [name] today has, No headache, No chest pain, No abdominal pain - No Nausea, No new weakness tingling or numbness, still having right lower chest wall pain, worse with deep breaths and coughing.   Assessment  & Plan :   Right sided chest wall pleuritic chest pain in the setting of community-acquired pneumonia.  Recent diagnosis of COVID ?,  Will repeat respiratory viral panel, continue Levaquin for now, inflammatory markers are moderately elevated.  He is at risk for developing PE hence will check CTA as well.  He is established ESRD patient for at least 2 years and will be okay for contrast, will also involve speech to rule out aspiration.  Continue supportive  care.  ESRD.  On TTS schedule nephrology called.  Anemia of chronic disease.  Stable.  Essential hypertension.  Switch Norvasc to Coreg and BiDil combination.  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  Currently compensated, HD for any fluid removal.  Will place on low-dose Coreg along with BiDil, avoiding ACE/ARB due to ESRD.  DM Type II.  ISS.  Lab Results  Component Value Date   HGBA1C 7.1 (H) 05/12/2021   CBG (last 3)  Recent Labs    05/20/22 1208 05/20/22 1524 05/20/22 1647  GLUCAP 240* 131* 205*         Condition - Fair  Family Communication  :  None present  Code Status :  FRull  Consults  :  Renal  PUD Prophylaxis : Pepcid   Procedures  :     CTA -       Disposition Plan  :    Status is: Inpatient  DVT Prophylaxis  :    heparin injection 5,000 Units Start: 05/20/22 1400   Lab Results  Component Value Date   PLT 266 05/21/2022    Diet :  Diet Order             Diet renal/carb modified with fluid restriction Diet-HS Snack? Nothing; Fluid restriction: 1200 mL Fluid; Room service appropriate? Yes; Fluid consistency: Thin  Diet effective now                    Inpatient Medications  Scheduled Meds:  amLODipine  10 mg Oral Daily   calcium acetate  2,001 mg Oral TID   heparin  5,000 Units Subcutaneous Q8H   insulin aspart  0-6 Units Subcutaneous TID WC   sevelamer carbonate  1,600 mg Oral TID WC   sodium bicarbonate  650 mg Oral Daily   Continuous Infusions:  [START ON 05/22/2022] levofloxacin (LEVAQUIN) IV     PRN Meds:.acetaminophen **OR** acetaminophen, morphine injection, ondansetron **OR** ondansetron (ZOFRAN) IV    Objective:   Vitals:   05/20/22 1845 05/20/22 2059 05/20/22 2308 05/21/22 0607  BP:  (!) 156/91 (!) 171/89 (!) 162/97  Pulse: 89 87 85 76  Resp:  17 20 18   Temp:  97.9 F (36.6 C)  97.7 F (36.5 C)  TempSrc:    Oral  SpO2: 100% 100% 100% 100%  Weight:      Height:        Wt  Readings from Last 3 Encounters:  05/20/22 82 kg  05/17/22 81.6 kg  05/13/22 62.6 kg     Intake/Output Summary (Last 24 hours) at 05/21/2022 0827 Last data filed at 05/20/2022 1410 Gross per 24 hour  Intake 999 ml  Output --  Net 999 ml     Physical Exam  Awake Alert, No new F.N deficits, Normal affect .AT,PERRAL Supple Neck, No JVD,   Symmetrical Chest wall movement, Good air movement bilaterally, CTAB RRR,No Gallops,Rubs or new Murmurs,  +ve B.Sounds, Abd Soft, No tenderness,   No Cyanosis, Clubbing or edema        Data Review:    Recent Labs  Lab 05/16/22 0027 05/20/22 0505 05/20/22 0516 05/21/22 0457  WBC 8.6 10.7*  --  9.2  HGB 9.8* 9.5* 11.2* 8.1*  HCT 30.8* 31.6* 33.0* 25.2*  PLT 456* 389  --  266  MCV 91.1 94.3  --  89.4  MCH 29.0 28.4  --  28.7  MCHC 31.8 30.1  --  32.1  RDW 14.4 14.5  --  14.2  LYMPHSABS 1.1 0.5*  --   --   MONOABS 1.0 0.7  --   --   EOSABS 0.1 0.1  --   --   BASOSABS 0.0 0.0  --   --     Recent Labs  Lab 05/16/22 0027 05/17/22 0129 05/20/22 0505 05/20/22 0516 05/21/22 0457 05/21/22 0657  NA 137  140 137 140 136  --   K 4.9 4.4 4.3 4.4 4.7  --   CL 104 102 99 102 97*  --   CO2 18* 25 26  --  24  --   ANIONGAP 15 13 12   --  15  --   GLUCOSE 285* 150* 158* 159* 107*  --   BUN 101* 56* 47* 45* 59*  --   CREATININE 14.77* 9.10* 8.47* 8.90* 9.42*  --   AST 17  --  28  --  19  --   ALT 14  --  22  --  19  --   ALKPHOS 151*  --  192*  --  131*  --   BILITOT 0.4  --  0.5  --  0.6  --   ALBUMIN 2.8*  --  3.2*  --  2.4*  --   CRP  --   --   --   --   --  12.6*  LATICACIDVEN  --   --  1.1  --   --   --   BNP >4,500.0*  --   --   --   --  >4,500.0*  CALCIUM 7.2* 7.4* 7.6*  --  8.0*  --     Radiology Reports DG Chest Port 1 View  Result Date: 05/21/2022 CLINICAL DATA:  Shortness of breath. EXAM: PORTABLE CHEST 1 VIEW COMPARISON:  05/20/2022 FINDINGS: The cardio pericardial silhouette is enlarged. Persistent  collapse/consolidation noted right lower lung with worsening atelectasis or infiltrate at the left base. Vascular congestion noted without overt airspace pulmonary edema. Bones are diffusely demineralized. Telemetry leads overlie the chest. IMPRESSION: Persistent collapse/consolidation right lower lung with worsening atelectasis or infiltrate at the left base. Electronically Signed   By: Misty Stanley M.D.   On: 05/21/2022 06:42   DG Chest Port 1 View  Result Date: 05/20/2022 CLINICAL DATA:  Fever and vomiting. EXAM: PORTABLE CHEST 1 VIEW COMPARISON:  May 12, 2022 FINDINGS: The cardiac silhouette is markedly enlarged and unchanged in size. Marked severity infiltrate is seen within the right lung base. There is no evidence of a pleural effusion or pneumothorax. The visualized skeletal structures are unremarkable. IMPRESSION: Stable cardiomegaly with marked severity right basilar infiltrate. Electronically Signed   By: Virgina Norfolk M.D.   On: 05/20/2022 04:14      Signature  -   Lala Lund M.D on 05/21/2022 at 8:27 AM   -  To page go to [email or web address]

## 2022-05-21 NOTE — Progress Notes (Signed)
Patient refusing to go to CT. MD notified.

## 2022-05-21 NOTE — Evaluation (Signed)
Physical Therapy Evaluation Patient Details Name: [name] MRN: [id number] DOB: 22-Oct-1970 Today's Date: 05/21/2022  History of Present Illness  Pt is 52 year old presented to [facility] on  05/20/22 with chest pain, abdominal pain, and vomiting. Pt with PNA and recent covid + (12/15). PMH - ESRD on HD, HTN; DM; chronic systolic CHF; anxiety, depression.  Clinical Impression  Pt presents to PT with decr mobility and functional activity tolerance. Requiring supplemental O2 at rest and with activity. Expect he will quickly progress with mobility and will not require PT at time of dc. Pt would like to find a place to stay off the streets.        Recommendations for follow up therapy are one component of a multi-disciplinary discharge planning process, led by the attending physician.  Recommendations may be updated based on patient status, additional functional criteria and insurance authorization.  Follow Up Recommendations No PT follow up      Assistance Recommended at Discharge None  Patient can return home with the following       Equipment Recommendations None recommended by PT  Recommendations for Other Services  Other (comment) (mobility team)    Functional Status Assessment Patient has had a recent decline in their functional status and demonstrates the ability to make significant improvements in function in a reasonable and predictable amount of time.     Precautions / Restrictions Precautions Precautions: Other (comment) Precaution Comments: watch O2 sats      Mobility  Bed Mobility Overal bed mobility: Independent                  Transfers Overall transfer level: Needs assistance Equipment used: None Transfers: Sit to/from Stand Sit to Stand: Supervision           General transfer comment: supervision for safety and lines    Ambulation/Gait Ambulation/Gait assistance: Min guard Gait Distance (Feet): 150 Feet Assistive device: None Gait  Pattern/deviations: Step-through pattern, Decreased stride length Gait velocity: decr Gait velocity interpretation: 1.31 - 2.62 ft/sec, indicative of limited community ambulator   General Gait Details: Assist for safety and lines  Stairs            Wheelchair Mobility    Modified Rankin (Stroke Patients Only)       Balance Overall balance assessment: Mild deficits observed, not formally tested                                           Pertinent Vitals/Pain Pain Assessment Pain Assessment: No/denies pain    Home Living Family/patient expects to be discharged to:: Shelter/Homeless                   Additional Comments: Has been living on the streets since July    Prior Function Prior Level of Function : Independent/Modified Independent             Mobility Comments: No assistive device       Hand Dominance   Dominant Hand: Left    Extremity/Trunk Assessment   Upper Extremity Assessment Upper Extremity Assessment: Overall WFL for tasks assessed    Lower Extremity Assessment Lower Extremity Assessment: Generalized weakness       Communication   Communication: No difficulties  Cognition Arousal/Alertness: Awake/alert Behavior During Therapy: WFL for tasks assessed/performed Overall Cognitive Status: Within Functional Limits for tasks assessed  General Comments General comments (skin integrity, edema, etc.): SpO2 98% on 2L. Removed O2 with SpO2 86-87% and then 85% with ambulation    Exercises     Assessment/Plan    PT Assessment Patient needs continued PT services  PT Problem List Decreased strength;Decreased activity tolerance;Decreased mobility;Cardiopulmonary status limiting activity       PT Treatment Interventions Gait training;Stair training;Functional mobility training;Therapeutic activities;Therapeutic exercise;Patient/family education    PT Goals  (Current goals can be found in the Care Plan section)  Acute Rehab PT Goals Patient Stated Goal: Find a place to stay off the streets PT Goal Formulation: With patient Time For Goal Achievement: 05/28/22 Potential to Achieve Goals: Good    Frequency Min 3X/week     Co-evaluation               AM-PAC PT "6 Clicks" Mobility  Outcome Measure Help needed turning from your back to your side while in a flat bed without using bedrails?: None Help needed moving from lying on your back to sitting on the side of a flat bed without using bedrails?: None Help needed moving to and from a bed to a chair (including a wheelchair)?: A Little Help needed standing up from a chair using your arms (e.g., wheelchair or bedside chair)?: A Little Help needed to walk in hospital room?: A Little Help needed climbing 3-5 steps with a railing? : A Little 6 Click Score: 20    End of Session Equipment Utilized During Treatment: Oxygen Activity Tolerance: Patient tolerated treatment well Patient left: in bed;with call bell/phone within reach Nurse Communication: Mobility status PT Visit Diagnosis: Other abnormalities of gait and mobility (R26.89);Muscle weakness (generalized) (M62.81)    Time: 4196-2229 PT Time Calculation (min) (ACUTE ONLY): 35 min   Charges:   PT Evaluation $PT Eval Low Complexity: 1 Low PT Treatments $Gait Training: 8-22 mins        [facility] [place] 05/21/2022, 4:55 PM

## 2022-05-21 NOTE — Discharge Instructions (Addendum)
[employer] assistance programs Crisis assistance program Find help for paying your rent, electric bills, free food, and even funds to pay your mortgage. The [employer] (726)219-5157) offers several services to local families, as funding allows. The Emergency Assistance Program (EAP), which they administer, provides household goods, free food, clothing, and financial aid to people in need in the [facility] area. The EAP program does have some qualification, and counselors will interview clients for financial assistance by written referral only. Referrals need to be made by the Department of Social Services or by other EAP approved human services agencies or charities in the area. [employer], which can be reached at ([phone], offers emergency assistance programs for those in need of help, such as food, rent assistance, a soup kitchen, shelter, and clothing, They are based in [facility] but provide a number of services to those that qualify for assistance. Continue with [employer] programs. [place] may be able to offer temporary financial assistance and cash grants for paying rent and utilities, Help may be provided for local county residents who may be experiencing persona! crisis when other resources, including government programs, are not available. Call [phone] St[employer], which is based in [place], provides financial assistance of up to $50.00 to help pay for rent, utilities, cooling bills, rent, and prescription medications. The program also provides secondhand furniture to those in need. [phone]  [place] is a [place], The organization can offer emergency assistance for paying rent, [employer], utilities, food, household products and furniture. They offer extensive emergency and transitional housing  for families, children and single women, and also run a Boyls and Girl ts Club. [place], [employer], and other aid offered too. [address] [place], [place], [phone] Additional locations of the [employer] are in [place] and other nearby communities. When you have an emergency, need free food, money for basic needs, or just need assistance around Christmas, then the [employer] may have the resources you need. Or they can refer you to nearby agencies. Learn more. [employer] Program -- This is offered for [facility] families. The  federal government created [employer] Program provides a one-time cash grant payment to help eligible low-income families pay their electric and heating bills. [address] [place], [place] [address], [phone] [employer] Emergency Assistance -- A program offers emergency utility and rent funds for greater [employer] area residents. The program can also provide counseling and referrals to charities and government programs. Also provides food and a free meal program that serves lunch Mondays - Saturdays and dinner seven days per week to individuals in the community. [address] [place], [place], [phone] [place] affordable apartment and housing communities across [place] and [place]. The low income and seniors can access public housing, rental assistance to qualified applicants, and apply for the section 8 rent subsidy program. Other programs include Visual merchandiser and Barrister's clerk. [address], [place], [place], dial [phone]. The [facility] provides transitional housing to veterans and the disabled. Clients will also access other services too, including life skills classes, case management, and assistance in finding  permanent housing. [address] [place], [place], call [phone] [place]  Housing in [place] is for people who were just evicted or that are formerly homeless. The non-profit will also help then gain self-sufficiency, find a home or apartment to live in, and also provides information on rent assistance when needed. Dial [phone]     [employer] Partnership to End Homelessness is available in [place]. Families that were evicted or that are homeless can gain shelter, food, clothing, furniture, and also emergency financial assistance. Other services include financial skills and life skills coaching, job training, and case management. [address] [place], [place] [address]. Telephone [phone].  [facility]) M-F [phone] [address], [place] [address] [phone] Services include: laundry, barbering, support groups, case management, phone & computer access, showers, AA/NA mtgs, mental health/substance abuse nurse, job skills class, disability information, VA assistance, spiritual classes, etc. [facility] [place], [place] Provides breakfast each weekday morning except Wednesdays, and an evening community meal every Friday. Access to showers is available during breakfast hours and telephones for seeking work are also provided. Also offers job referral and counsel ing for the homeless and unemployed. [facility]  [place] [address] Men's and Women  [place], [place] [address] [address], [place] [phone] ext. [place] [address]  [place] [address] [place], [place]  [address]  [phone]  [employer] (women only) Room at the [place]- operates housing and programs for single,  [place] pregnant women and single mothers with children  [place], [place] [address] [address] [place], [place] [address] [place]  [place] [facility]  [phone]. [place], [place] [address] [place],  [address] [facility] Mobile Crisis [phone]   Follow with Primary MD [name], Clinic in 7 days get a referral to cardiologist within a week of discharge.  Get CBC, CMP, 2 view Chest X ray -  checked next visit with your primary MD   Activity: As tolerated with Full fall precautions use walker/cane & assistance as needed  Disposition Home    Diet: Renal diet, low carbohydrate diet with strict 1.5 L fluid restriction per day.  Check your Weight same time everyday, if you gain over 2 pounds, or you develop in leg swelling, experience more shortness of breath or chest pain, call your Primary MD immediately. Follow Cardiac Low Salt Diet and 1.5 lit/day fluid restriction.  Accuchecks 4 times/day, Once in AM empty stomach and then before each meal. Log in all results and show them to your Prim.MD in 3 days. If any glucose reading is under 80 or above 300 call your Prim MD immidiately. Follow Low glucose instructions for glucose under 80 as instructed.   Special Instructions: If you have smoked or chewed Tobacco  in the last 2 yrs please stop smoking, stop any regular Alcohol  and or any Recreational drug use.  On your next visit with your primary care physician please Get Medicines reviewed and adjusted.  Please request your Prim.MD to go over all Hospital Tests and Procedure/Radiological results at the follow up, please get all Hospital records sent to your Prim MD by signing  hospital release  before you go home.  If you experience worsening of your admission symptoms, develop shortness of breath, life threatening emergency, suicidal or homicidal thoughts you must seek medical attention immediately by calling 911 or calling your MD immediately  if symptoms less severe.  You Must read complete instructions/literature along with all the possible adverse reactions/side effects for all the Medicines you take and that have been prescribed to you. Take any new Medicines after you have completely understood and accpet all the possible adverse reactions/side effects.

## 2022-05-21 NOTE — Evaluation (Signed)
Clinical/Bedside Swallow Evaluation Patient Details  Name: [name] MRN: [id number] Date of Birth: 1970/06/02  Today's Date: 05/21/2022 Time: SLP Start Time (ACUTE ONLY): 1156 SLP Stop Time (ACUTE ONLY): 1206 SLP Time Calculation (min) (ACUTE ONLY): 10 min  Past Medical History:  Past Medical History:  Diagnosis Date   Anxiety 02/2019   Anxiety 10/2019   Diabetes mellitus type II, uncontrolled 07/17/2006   Elevated alkaline phosphatase level 01/2019   ESSENTIAL HYPERTENSION 07/17/2006   Homelessness 10/13/2021   HYPERLIPIDEMIA 11/24/2008   Insomnia 02/2019   Left bundle branch block 02/22/2019   Lesion of lip 10/2019   Pneumonia 10/22/2011   Suicidal ideations 02/2019   Vitamin D deficiency 01/2019   Past Surgical History:  Past Surgical History:  Procedure Laterality Date   ARM SURGERY     AV FISTULA PLACEMENT Right 02/20/2021   Procedure: RIGHT ARTERIOVENOUS GRAFT CREATION;  Surgeon: Angelia Mould, MD;  Location: [facility];  Service: Vascular;  Laterality: Right;   INSERTION OF DIALYSIS CATHETER Left 02/20/2021   Procedure: INSERTION OF DIALYSIS CATHETER USING PALINDROME CATHETER;  Surgeon: Angelia Mould, MD;  Location: [facility];  Service: Vascular;  Laterality: Left;   IR AV DIALY SHUNT INTRO NEEDLE/INTRACATH INITIAL W/PTA/IMG RIGHT Right 11/06/2021   IR REMOVAL TUN CV CATH W/O FL  07/27/2021   IR US GUIDE VASC ACCESS RIGHT  11/06/2021   ULTRASOUND GUIDANCE FOR VASCULAR ACCESS  02/20/2021   Procedure: ULTRASOUND GUIDANCE FOR VASCULAR ACCESS;  Surgeon: Angelia Mould, MD;  [facility];  Service: Vascular;;   HPI:  52 y.o. male with medical history significant for anxiety, depression, suicidal ideations, insomnia, homelessness, type II DM, elevated alkaline phosphatase level, hypertension, hyperlipidemia, left bundle branch block, history of pneumonia, vitamin D deficiency, ESRD on hemodialysis who came into the emergency department due to nausea, vomiting,  RLL chest and abdominal pain.  Having pain in the right lower chest which is pleuritic in nature worse with deep breaths and cough along with fever for the last 7 to 8 days, he claims he was diagnosed with COVID about 10 days ago, came to the hospital where he was diagnosed with pneumonia and admitted to the hospital.    Assessment / Plan / Recommendation  Clinical Impression  Pt poorly participatory; has been refusing most treatments today. After multiple attempts to assess swallow, he was finally willing to drink water while supine in bed.  There were no overt s/s of aspiration. No focal deficits noted in oral mechansim. Voice strong/clear. He declined to try further POs. Recommend continuing current diet as written. Based on hx and limited observation, prandial aspiration is unlikely. Certainly re-consult SLP if   he is observed to have s/s of dysphagia. D/W RN.  SLP will sign off. SLP Visit Diagnosis: Dysphagia, unspecified (R13.10)    Aspiration Risk  No limitations    Diet Recommendation   Regular solids, thin liquids per MD  Medication Administration: Whole meds with liquid    Other  Recommendations Oral Care Recommendations: Oral care BID    Recommendations for follow up therapy are one component of a multi-disciplinary discharge planning process, led by the attending physician.  Recommendations may be updated based on patient status, additional functional criteria and insurance authorization.  Follow up Recommendations No SLP follow up       Swallow Study   General Date of Onset: 05/20/22 HPI: 52 y.o. male with medical history significant for anxiety, depression, suicidal ideations, insomnia, homelessness, type II DM,  elevated alkaline phosphatase level, hypertension, hyperlipidemia, left bundle branch block, history of pneumonia, vitamin D deficiency, ESRD on hemodialysis who came into the emergency department due to nausea, vomiting, RLL chest and abdominal pain.  Having pain in  the right lower chest which is pleuritic in nature worse with deep breaths and cough along with fever for the last 7 to 8 days, he claims he was diagnosed with COVID about 10 days ago, came to the hospital where he was diagnosed with pneumonia and admitted to the hospital. Type of Study: Bedside Swallow Evaluation Previous Swallow Assessment: no Diet Prior to this Study: Regular;Thin liquids Temperature Spikes Noted: No Respiratory Status: Room air History of Recent Intubation: No Behavior/Cognition: Alert;Uncooperative Oral Cavity Assessment:  (unable to assess) Oral Care Completed by SLP: No Self-Feeding Abilities: Able to feed self Patient Positioning:  (supine) Baseline Vocal Quality: Normal Volitional Cough: Strong    Oral/Motor/Sensory Function Overall Oral Motor/Sensory Function: Other (comment) (no obvious deficits)   Ice Chips Ice chips: Not tested   Thin Liquid Thin Liquid: Within functional limits    Nectar Thick Nectar Thick Liquid: Not tested   Honey Thick Honey Thick Liquid: Not tested   Puree Puree: Not tested   Solid     Solid: Not tested      [name] 05/21/2022,12:09 PM  Douglas Bamberg L. Tivis Ringer, MA CCC/SLP Clinical Specialist - [place] Office number [phone]

## 2022-05-22 DIAGNOSIS — J189 Pneumonia, unspecified organism: Secondary | ICD-10-CM | POA: Diagnosis not present

## 2022-05-22 LAB — GLUCOSE, CAPILLARY
Glucose-Capillary: 169 mg/dL — ABNORMAL HIGH (ref 70–99)
Glucose-Capillary: 124 mg/dL — ABNORMAL HIGH (ref 70–99)
Glucose-Capillary: 160 mg/dL — ABNORMAL HIGH (ref 70–99)
Glucose-Capillary: 114 mg/dL — ABNORMAL HIGH (ref 70–99)

## 2022-05-22 LAB — HEPATITIS B SURFACE ANTIBODY, QUANTITATIVE: Hep B S AB Quant (Post): <3.1 m[IU]/mL — ABNORMAL LOW (ref >9.9)

## 2022-05-22 MED ORDER — ALTEPLASE 2 MG IJ SOLR: 2.0000 mg | Freq: Once | INTRAMUSCULAR | Status: DC | PRN

## 2022-05-22 MED ORDER — LIDOCAINE-PRILOCAINE 2.5-2.5 % EX CREA
1.0000 | TOPICAL_CREAM | CUTANEOUS | Status: DC | PRN
  Filled 2022-05-22: qty 5

## 2022-05-22 MED ORDER — PENTAFLUOROPROP-TETRAFLUOROETH EX AERO: 1.0000 | INHALATION_SPRAY | CUTANEOUS | Status: DC | PRN

## 2022-05-22 MED ORDER — HEPARIN SODIUM (PORCINE) 1000 UNIT/ML DIALYSIS: 1000.0000 [IU] | INTRAMUSCULAR | Status: DC | PRN

## 2022-05-22 MED ORDER — LIDOCAINE HCL (PF) 1 % IJ SOLN
5.0000 mL | INTRAMUSCULAR | Status: DC | PRN
  Filled 2022-05-22: qty 5

## 2022-05-22 NOTE — Progress Notes (Signed)
Mobility Specialist Progress Note:    05/22/22 1100  Mobility  Activity Refused mobility  Mobility Referral Yes    Pt refused mobility session despite max encouragement. Kept saying "I'm good." Left pt in bed with all needs met.   Royetta Crochet Mobility Specialist Please contact via Solicitor or  Rehab office at [phone]

## 2022-05-22 NOTE — Plan of Care (Signed)

## 2022-05-22 NOTE — Progress Notes (Signed)
Patient advised will take 1600 meds at 1800.  Phoslo and Bidil.  Patient refused Heparin injection.

## 2022-05-22 NOTE — Progress Notes (Signed)
CSW received request to speak with patient regarding homelessness. CSW called patient on room phone; no answer. Resources placed on AVS. No response from [facility] program. Renal Navigator has spoken with OP HD SW to schedule transportation to appointments from the [employer]. [facility] also has warming shelter from [place] LCSW, MSW, [facility]

## 2022-05-22 NOTE — Progress Notes (Signed)
   05/22/22 0039  Vitals  Temp 98.1 F (36.7 C)  Temp Source Oral  BP (!) 142/68  MAP (mmHg) 89  BP Location Left Arm  BP Method Automatic  Patient Position (if appropriate) Lying  Pulse Rate 79  Pulse Rate Source Monitor  Resp 17  Oxygen Therapy  SpO2 100 %  O2 Device Nasal Cannula  O2 Flow Rate (L/min) 2 L/min  Post Treatment  Dialyzer Clearance Lightly streaked  Duration of HD Treatment -hour(s) 3.5 hour(s)  Liters Processed 73.5  Fluid Removed (mL) 4000 mL  Tolerated HD Treatment Yes  Post-Hemodialysis Comments tx complete, pt stable, no adverse events  AVG/AVF Arterial Site Held (minutes) 10 minutes  AVG/AVF Venous Site Held (minutes) 10 minutes

## 2022-05-22 NOTE — Progress Notes (Signed)
PROGRESS NOTE                                                                                                                                                                                                             Patient Demographics:    [name], is a 52 y.o. male, DOB - 03-Dec-1970, YQM:[id number]  Outpatient Primary MD for the patient is [place] date - 05/20/2022    Chief Complaint  Patient presents with   Emesis       Brief Narrative (HPI from H&P)    52 y.o. male with medical history significant of anxiety, depression, suicidal ideations, insomnia, homelessness, type II DM, elevated alkaline phosphatase level, hypertension, hyperlipidemia, left bundle branch block, history of pneumonia, vitamin D deficiency, ESRD on hemodialysis who came into the emergency department due to nausea, vomiting, RLL chest and abdominal pain.  Having pain in the right lower chest which is pleuritic in nature worse with deep breaths and cough along with fever for the last 7 to 8 days, he claims he was diagnosed with COVID about 10 days ago, came to the hospital where he was diagnosed with pneumonia and admitted to the hospital.   Subjective:   Patient in bed, appears comfortable, denies any headache, no fever, no chest pain or pressure, no shortness of breath , no abdominal pain. No new focal weakness.    Assessment  & Plan :   Right sided chest wall pleuritic chest pain in the setting of community-acquired pneumonia with Streptococcus pneumonia bacteremia.  Recent diagnosis of COVID ?,  Will repeat respiratory viral panel, so some nausea vomiting with questionable aspiration, on Unasyn, speech therapy following, clinically better sepsis pathophysiology not present.  Patient noncompliant with blood draws, refused blood draw on 05/22/2022, refused CT scan of chest on 05/21/2022 and again on 05/22/2022.  Counseled.  Will  monitor with IV antibiotics on board.  Clinically improving.  Advance activity.  Encouraged to sit in chair use I-S and flutter valve in daytime for pulmonary toiletry.    ESRD.  On TTS schedule nephrology called.  Anemia of chronic disease.  Stable.  Essential hypertension.  Switch Norvasc to Coreg and BiDil combination.  Chronic combined systolic and diastolic CHF EF 52% in 8413.  Currently compensated, HD for any fluid removal.  Will place on low-dose Coreg along with BiDil, avoiding ACE/ARB due to ESRD.  DM Type II.  ISS.  Lab Results  Component Value Date   HGBA1C 8.2 (H) 05/21/2022   CBG (last 3)  Recent Labs    05/21/22 1616 05/21/22 2003 05/22/22 0818  GLUCAP 108* 111* 169*         Condition - Fair  Family Communication  :  None present  Code Status :  FRull  Consults  :  Renal  PUD Prophylaxis : Pepcid   Procedures  :     CTA - pt refused      Disposition Plan  :    Status is: Inpatient  DVT Prophylaxis  :    heparin injection 5,000 Units Start: 05/20/22 1400   Lab Results  Component Value Date   PLT 266 05/21/2022    Diet :  Diet Order             Diet renal/carb modified with fluid restriction Diet-HS Snack? Nothing; Fluid restriction: 1200 mL Fluid; Room service appropriate? Yes; Fluid consistency: Thin  Diet effective now                    Inpatient Medications  Scheduled Meds:  calcium acetate  2,001 mg Oral TID   carvedilol  3.125 mg Oral BID WC   Chlorhexidine Gluconate Cloth  6 each Topical Q0600   darbepoetin (ARANESP) injection - DIALYSIS  100 mcg Subcutaneous Q Wed-1800   famotidine  20 mg Oral Daily   feeding supplement (NEPRO CARB STEADY)  237 mL Oral TID WC   heparin  5,000 Units Subcutaneous Q8H   insulin aspart  0-6 Units Subcutaneous TID WC   isosorbide-hydrALAZINE  1 tablet Oral TID   sevelamer carbonate  1,600 mg Oral TID WC   Continuous Infusions:  ampicillin-sulbactam (UNASYN) IV Stopped (05/21/22  1552)   PRN Meds:.acetaminophen **OR** [DISCONTINUED] acetaminophen, HYDROcodone-acetaminophen, ondansetron **OR** ondansetron (ZOFRAN) IV    Objective:   Vitals:   05/22/22 0946 05/22/22 0947 05/22/22 0948 05/22/22 0949  BP:      Pulse: 83 82 81 82  Resp: 17 16 15 16   Temp:      TempSrc:      SpO2: 93% 91% 95% 98%  Weight:      Height:        Wt Readings from Last 3 Encounters:  05/22/22 71.1 kg  05/17/22 81.6 kg  05/13/22 62.6 kg     Intake/Output Summary (Last 24 hours) at 05/22/2022 1000 Last data filed at 05/22/2022 0222 Gross per 24 hour  Intake 340 ml  Output 4000 ml  Net -3660 ml     Physical Exam  Awake Alert, No new F.N deficits, Normal affect [place].AT,PERRAL Supple Neck, No JVD,   Symmetrical Chest wall movement, Good air movement bilaterally, CTAB RRR,No Gallops,Rubs or new Murmurs,  +ve B.Sounds, Abd Soft, No tenderness,   No Cyanosis, Clubbing or edema        Data Review:    Recent Labs  Lab 05/16/22 0027 05/20/22 0505 05/20/22 0516 05/21/22 0457  WBC 8.6 10.7*  --  9.2  HGB 9.8* 9.5* 11.2* 8.1*  HCT 30.8* 31.6* 33.0* 25.2*  PLT 456* 389  --  266  MCV 91.1 94.3  --  89.4  MCH 29.0 28.4  --  28.7  MCHC 31.8 30.1  --  32.1  RDW 14.4 14.5  --  14.2  LYMPHSABS 1.1 0.5*  --   --  MONOABS 1.0 0.7  --   --   EOSABS 0.1 0.1  --   --   BASOSABS 0.0 0.0  --   --     Recent Labs  Lab 05/16/22 0027 05/17/22 0129 05/20/22 0505 05/20/22 0516 05/21/22 0457 05/21/22 0657  NA 137 140 137 140 136  --   K 4.9 4.4 4.3 4.4 4.7  --   CL 104 102 99 102 97*  --   CO2 18* 25 26  --  24  --   ANIONGAP 15 13 12   --  15  --   GLUCOSE 285* 150* 158* 159* 107*  --   BUN 101* 56* 47* 45* 59*  --   CREATININE 14.77* 9.10* 8.47* 8.90* 9.42*  --   AST 17  --  28  --  19  --   ALT 14  --  22  --  19  --   ALKPHOS 151*  --  192*  --  131*  --   BILITOT 0.4  --  0.5  --  0.6  --   ALBUMIN 2.8*  --  3.2*  --  2.4*  --   CRP  --   --   --   --   --  12.6*   LATICACIDVEN  --   --  1.1  --   --   --   HGBA1C  --   --   --   --  8.2*  --   BNP >4,500.0*  --   --   --   --  >4,500.0*  CALCIUM 7.2* 7.4* 7.6*  --  8.0*  --     Radiology Reports DG Chest Port 1 View  Result Date: 05/21/2022 CLINICAL DATA:  Shortness of breath. EXAM: PORTABLE CHEST 1 VIEW COMPARISON:  05/20/2022 FINDINGS: The cardio pericardial silhouette is enlarged. Persistent collapse/consolidation noted right lower lung with worsening atelectasis or infiltrate at the left base. Vascular congestion noted without overt airspace pulmonary edema. Bones are diffusely demineralized. Telemetry leads overlie the chest. IMPRESSION: Persistent collapse/consolidation right lower lung with worsening atelectasis or infiltrate at the left base. Electronically Signed   By: Misty Stanley M.D.   On: 05/21/2022 06:42   DG Chest Port 1 View  Result Date: 05/20/2022 CLINICAL DATA:  Fever and vomiting. EXAM: PORTABLE CHEST 1 VIEW COMPARISON:  May 12, 2022 FINDINGS: The cardiac silhouette is markedly enlarged and unchanged in size. Marked severity infiltrate is seen within the right lung base. There is no evidence of a pleural effusion or pneumothorax. The visualized skeletal structures are unremarkable. IMPRESSION: Stable cardiomegaly with marked severity right basilar infiltrate. Electronically Signed   By: Virgina Norfolk M.D.   On: 05/20/2022 04:14      Signature  -   Lala Lund M.D on 05/22/2022 at 10:00 AM   -  To page go to [email or web address]

## 2022-05-22 NOTE — Progress Notes (Signed)
[place] with CT called and advised pt still has a STAT CT ordered from yesterday when patient refused CT.  Patient is refusing CT again today.

## 2022-05-22 NOTE — Progress Notes (Signed)
Patient refused labs this morning states, he will let them stick after breakfast.

## 2022-05-22 NOTE — Progress Notes (Addendum)
[facility] Progress Note   Subjective:    Seen and examined patient at bedside. Tolerated HD overnight with net UF 4L. No acute complaints and volume is improving. Next HD 05/23/21.  Objective Vitals:   05/22/22 1004 05/22/22 1005 05/22/22 1006 05/22/22 1147  BP:    (!) 152/89  Pulse: 77 77 76 68  Resp: 12 13 12 16   Temp:    97.9 F (36.6 C)  TempSrc:    Oral  SpO2: (!) 55% (!) 62% (!) 76% 100%  Weight:      Height:       Physical Exam General: Awake, alert, 3L [place], NAD Heart: S1 and S2; No MGRs Lungs: Fine rales with rhonchi R mid lobe, clear upper lobes, no wheezing Abdomen: Soft and non-tender Extremities: 1+ LE edema (improving compared to yesterday) Dialysis Access: R AVG (+) B/T   Filed Weights   05/20/22 1736 05/21/22 2050 05/22/22 0044  Weight: 82 kg 79 kg 71.1 kg    Intake/Output Summary (Last 24 hours) at 05/22/2022 1149 Last data filed at 05/22/2022 0950 Gross per 24 hour  Intake 580 ml  Output 4000 ml  Net -3420 ml    Additional Objective Labs: Basic Metabolic Panel: Recent Labs  Lab 05/17/22 0129 05/20/22 0505 05/20/22 0516 05/21/22 0457  NA 140 137 140 136  K 4.4 4.3 4.4 4.7  CL 102 99 102 97*  CO2 25 26  --  24  GLUCOSE 150* 158* 159* 107*  BUN 56* 47* 45* 59*  CREATININE 9.10* 8.47* 8.90* 9.42*  CALCIUM 7.4* 7.6*  --  8.0*   Liver Function Tests: Recent Labs  Lab 05/16/22 0027 05/20/22 0505 05/21/22 0457  AST 17 28 19   ALT 14 22 19   ALKPHOS 151* 192* 131*  BILITOT 0.4 0.5 0.6  PROT 7.2 7.6 6.1*  ALBUMIN 2.8* 3.2* 2.4*   Recent Labs  Lab 05/16/22 0027 05/20/22 0505  LIPASE 51 83*   CBC: Recent Labs  Lab 05/16/22 0027 05/20/22 0505 05/20/22 0516 05/21/22 0457  WBC 8.6 10.7*  --  9.2  NEUTROABS 6.3 9.4*  --   --   HGB 9.8* 9.5* 11.2* 8.1*  HCT 30.8* 31.6* 33.0* 25.2*  MCV 91.1 94.3  --  89.4  PLT 456* 389  --  266   Blood Culture    Component Value Date/Time   SDES  05/20/2022 1138    BLOOD LEFT  FOREARM Performed at [facility], [address] [place], [place] [address]    SPECREQUEST  05/20/2022 1138    BOTTLES DRAWN AEROBIC AND ANAEROBIC Blood Culture adequate volume Performed at [facility] [address], [place], [place] [address]    CULT  05/20/2022 1138    NO GROWTH 2 DAYS Performed at [facility] [address], [place], [place] [address]    REPTSTATUS PENDING 05/20/2022 1138    Cardiac Enzymes: No results for input(s): "CKTOTAL", "CKMB", "CKMBINDEX", "TROPONINI" in the last 168 hours. CBG: Recent Labs  Lab 05/21/22 0946 05/21/22 1214 05/21/22 1616 05/21/22 2003 05/22/22 0818  GLUCAP 131* 128* 108* 111* 169*   Iron Studies: No results for input(s): "IRON", "TIBC", "TRANSFERRIN", "FERRITIN" in the last 72 hours. Lab Results  Component Value Date   INR 1.05 09/08/2012   Studies/Results: DG Chest Port 1 View  Result Date: 05/21/2022 CLINICAL DATA:  Shortness of breath. EXAM: PORTABLE CHEST 1 VIEW COMPARISON:  05/20/2022 FINDINGS: The cardio pericardial silhouette is enlarged. Persistent collapse/consolidation  noted right lower lung with worsening atelectasis or infiltrate at the left base. Vascular congestion noted without overt airspace pulmonary edema. Bones are diffusely demineralized. Telemetry leads overlie the chest. IMPRESSION: Persistent collapse/consolidation right lower lung with worsening atelectasis or infiltrate at the left base. Electronically Signed   By: Misty Stanley M.D.   On: 05/21/2022 06:42    Medications:  ampicillin-sulbactam (UNASYN) IV Stopped (05/21/22 1552)    calcium acetate  2,001 mg Oral TID   carvedilol  3.125 mg Oral BID WC   Chlorhexidine Gluconate Cloth  6 each Topical Q0600   darbepoetin (ARANESP) injection - DIALYSIS  100 mcg Subcutaneous Q Wed-1800   famotidine  20 mg Oral Daily   feeding supplement (NEPRO CARB STEADY)  237 mL Oral TID WC   heparin  5,000 Units Subcutaneous Q8H    insulin aspart  0-6 Units Subcutaneous TID WC   isosorbide-hydrALAZINE  1 tablet Oral TID   sevelamer carbonate  1,600 mg Oral TID WC    Dialysis Orders: TTS - [facility] 4hrs, BFR 400, DFR 800,  EDW 61.5kg, 2K/ 2.5Ca Heparin 2000 units with HD No VDRA/ESA ordered  Assessment/Plan: CAP - managed by primary. On ABXs and repeating resp virus panel; CTA ordered since inflammatory markers elevated and r/o PE ESRD - on HD TTS but now off schedule d/t high census and low staffing. No emergent need for HD today. Received HD overnight. Volume improving. Next HD 05/23/22 to be back on TTS schedule. PO bicarb stopped since patient is on dialysis.  Hypertension/volume  - Bps slowly improving with HD. Volume improving. HD tomorrow for ongoing fluid removal. Anemia of CKD - Hgb 8.1, iron studies ordered and will order ESA here. No Fe while he is on ABXs. Secondary Hyperparathyroidism -  Corr Ca okay. Last PTH 382 from 05/16/22 in outpatient, checking PO4 in AM. Continue binders. Nutrition - Renal diet with fluid restriction, will add protein supplements  Tobie Poet, NP [facility] 05/22/2022,11:49 AM  LOS: 2 days     Noted to have strep pneumo bacteremia.  Team has him on unasyn.  He asks about getting a transplant and about doing dialysis at home.  He has struggled with homelessness.  I let him know that with noncompliance he is not a candidate for either at this time   General adult male in bed in no acute distress HEENT normocephalic atraumatic extraocular movements intact sclera anicteric Neck supple trachea midline Lungs clear to auscultation bilaterally normal work of breathing at rest  Heart S1S2 no rub Abdomen soft nontender nondistended Extremities no edema  Psych normal mood and affect  CAP - abx per primary team   Streptococcus pneumoniae bacteremia - on abx per primary team. Sensitivities pending   ESRD - for HD per TTS schedule   Anemia of CKD - aranesp 100 mcg  ordered to start today  Disposition - continue inpatient monitoring. Awaiting blood culture sensitivities, new oxygen requirement and PNA  Claudia Desanctis, MD 12:54 PM 05/22/2022

## 2022-05-22 NOTE — Progress Notes (Signed)
Received a call from pt's out-pt HD clinic social worker. Clinic social worker has arranged transportation to out-pt HD at d/c. [employer] [phone]) will pick pt up at the [facility] on TTS. Pick-up time is 5:50 am and drop off time back at [facility] is 11:00 am. Clinic social worker also arranged transportation to CHF MD appt on Jan 5. Pt will be picked up at 1:25 at [facility] and dropped off at 2:50 at [facility]. Met with pt at bedside and provided pt this information in written form. Also discussed details with pt as well. Will add to AVS for reference as well. Clinic aware pt will not d/c today and d/c date is unknown at this time. Will assist as needed.   Melven Sartorius Renal Navigator [phone]

## 2022-05-23 DIAGNOSIS — J189 Pneumonia, unspecified organism: Secondary | ICD-10-CM | POA: Diagnosis not present

## 2022-05-23 LAB — CBC WITH DIFFERENTIAL/PLATELET
Abs Immature Granulocytes: 0.04 10*3/uL (ref 0.00–0.07)
Basophils Absolute: 0 10*3/uL (ref 0.0–0.1)
Basophils Relative: 0 %
Eosinophils Absolute: 0.2 10*3/uL (ref 0.0–0.5)
Eosinophils Relative: 3 %
HCT: 27.8 % — ABNORMAL LOW (ref 39.0–52.0)
Hemoglobin: 8.4 g/dL — ABNORMAL LOW (ref 13.0–17.0)
Immature Granulocytes: 1 %
Lymphocytes Relative: 12 %
Lymphs Abs: 0.9 10*3/uL (ref 0.7–4.0)
MCH: 28.3 pg (ref 26.0–34.0)
MCHC: 30.2 g/dL (ref 30.0–36.0)
MCV: 93.6 fL (ref 80.0–100.0)
Monocytes Absolute: 0.9 10*3/uL (ref 0.1–1.0)
Monocytes Relative: 12 %
Neutro Abs: 5.5 10*3/uL (ref 1.7–7.7)
Neutrophils Relative %: 72 %
Platelets: 315 10*3/uL (ref 150–400)
RBC: 2.97 MIL/uL — ABNORMAL LOW (ref 4.22–5.81)
RDW: 14.2 % (ref 11.5–15.5)
WBC: 7.7 10*3/uL (ref 4.0–10.5)
nRBC: 0 % (ref 0.0–0.2)

## 2022-05-23 LAB — BASIC METABOLIC PANEL
Anion gap: 11 (ref 5–15)
BUN: 42 mg/dL — ABNORMAL HIGH (ref 6–20)
CO2: 29 mmol/L (ref 22–32)
Calcium: 8.2 mg/dL — ABNORMAL LOW (ref 8.9–10.3)
Chloride: 96 mmol/L — ABNORMAL LOW (ref 98–111)
Creatinine, Ser: 7.44 mg/dL — ABNORMAL HIGH (ref 0.61–1.24)
GFR, Estimated: 8 mL/min — ABNORMAL LOW (ref >60)
Glucose, Bld: 97 mg/dL (ref 70–99)
Potassium: 4.2 mmol/L (ref 3.5–5.1)
Sodium: 136 mmol/L (ref 135–145)

## 2022-05-23 LAB — CULTURE, BLOOD (ROUTINE X 2): Special Requests: ADEQUATE

## 2022-05-23 LAB — GLUCOSE, CAPILLARY
Glucose-Capillary: 111 mg/dL — ABNORMAL HIGH (ref 70–99)
Glucose-Capillary: 118 mg/dL — ABNORMAL HIGH (ref 70–99)
Glucose-Capillary: 186 mg/dL — ABNORMAL HIGH (ref 70–99)

## 2022-05-23 LAB — PHOSPHORUS: Phosphorus: 4.2 mg/dL (ref 2.5–4.6)

## 2022-05-23 LAB — BRAIN NATRIURETIC PEPTIDE: B Natriuretic Peptide: >4500 pg/mL — ABNORMAL HIGH (ref 0.0–100.0)

## 2022-05-23 LAB — FERRITIN: Ferritin: 1031 ng/mL — ABNORMAL HIGH (ref 24–336)

## 2022-05-23 LAB — IRON AND TIBC
Iron: 45 ug/dL (ref 45–182)
Saturation Ratios: 18 % (ref 17.9–39.5)
TIBC: 253 ug/dL (ref 250–450)
UIBC: 208 ug/dL

## 2022-05-23 LAB — C-REACTIVE PROTEIN: CRP: 7.3 mg/dL — ABNORMAL HIGH (ref <1.0)

## 2022-05-23 MED ORDER — DARBEPOETIN ALFA 100 MCG/0.5ML IJ SOSY: 100.0000 ug | PREFILLED_SYRINGE | INTRAMUSCULAR | Status: DC

## 2022-05-23 MED ORDER — HEPARIN SODIUM (PORCINE) 1000 UNIT/ML IJ SOLN
INTRAMUSCULAR | Status: AC
  Administered 2022-05-23: 2000 [IU] via INTRAVENOUS
  Filled 2022-05-23: qty 2

## 2022-05-23 MED ORDER — HEPARIN SODIUM (PORCINE) 1000 UNIT/ML IJ SOLN: 2000.0000 [IU] | Freq: Once | INTRAMUSCULAR | Status: AC

## 2022-05-23 MED ORDER — GUAIFENESIN-DM 100-10 MG/5ML PO SYRP
5.0000 mL | ORAL_SOLUTION | ORAL | Status: DC | PRN
  Administered 2022-05-23: 5 mL via ORAL
  Filled 2022-05-23: qty 5

## 2022-05-23 NOTE — Progress Notes (Signed)
PROGRESS NOTE                                                                                                                                                                                                             Patient Demographics:    [name], is a 52 y.o. male, DOB - 02/22/1971, TDS:[id number]  Outpatient Primary MD for the patient is [facility], Clinic    LOS - 3  Admit date - 05/20/2022    Chief Complaint  Patient presents with   Emesis       Brief Narrative (HPI from H&P)    52 y.o. male with medical history significant of anxiety, depression, suicidal ideations, insomnia, homelessness, type II DM, elevated alkaline phosphatase level, hypertension, hyperlipidemia, left bundle branch block, history of pneumonia, vitamin D deficiency, ESRD on hemodialysis who came into the emergency department due to nausea, vomiting, RLL chest and abdominal pain.  Having pain in the right lower chest which is pleuritic in nature worse with deep breaths and cough along with fever for the last 7 to 8 days, he claims he was diagnosed with COVID about 10 days ago, came to the hospital where he was diagnosed with pneumonia and admitted to the hospital.   Subjective:   Patient in bed, appears comfortable, denies any headache, no fever, no chest pain or pressure, no shortness of breath , no abdominal pain. No new focal weakness.   Assessment  & Plan :   Right sided chest wall pleuritic chest pain in the setting of community-acquired pneumonia with Streptococcus pneumonia bacteremia.  Recent diagnosis of COVID ?,  Will repeat respiratory viral panel, so some nausea vomiting with questionable aspiration, on Unasyn, speech therapy following, clinically better sepsis pathophysiology not present.  Patient noncompliant with blood draws, refused blood draw on 05/22/2022, refused CT scan of chest on 05/21/2022 and again on 05/22/2022.  Counseled.  Will  monitor with IV antibiotics on board.  Clinically improving.  Advance activity.  Encouraged to sit in chair use I-S and flutter valve in daytime for pulmonary toiletry.  Ordered repeat blood cultures on 05/23/2022 along with echocardiogram.  ESRD.  On TTS schedule nephrology called.  Anemia of chronic disease.  Stable.  Essential hypertension.  Switch Norvasc to Coreg and BiDil combination.  Chronic combined systolic and diastolic CHF EF 26% in 2035.  Currently compensated, HD for any fluid removal.  Will place on low-dose Coreg along with BiDil, avoiding ACE/ARB due to ESRD.  DM Type II.  ISS.  Lab Results  Component Value Date   HGBA1C 8.2 (H) 05/21/2022   CBG (last 3)  Recent Labs    05/22/22 1627 05/22/22 2030 05/23/22 0831  GLUCAP 160* 114* 111*         Condition - Fair  Family Communication  :  None present  Code Status :  FRull  Consults  :  Renal  PUD Prophylaxis : Pepcid   Procedures  :     CTA - pt refused      Disposition Plan  :    Status is: Inpatient  DVT Prophylaxis  :    heparin injection 5,000 Units Start: 05/20/22 1400   Lab Results  Component Value Date   PLT 315 05/23/2022    Diet :  Diet Order             Diet renal/carb modified with fluid restriction Diet-HS Snack? Nothing; Fluid restriction: 1200 mL Fluid; Room service appropriate? Yes; Fluid consistency: Thin  Diet effective now                    Inpatient Medications  Scheduled Meds:  calcium acetate  2,001 mg Oral TID   carvedilol  3.125 mg Oral BID WC   Chlorhexidine Gluconate Cloth  6 each Topical Q0600   darbepoetin (ARANESP) injection - DIALYSIS  100 mcg Subcutaneous Q Wed-1800   famotidine  20 mg Oral Daily   feeding supplement (NEPRO CARB STEADY)  237 mL Oral TID WC   heparin  5,000 Units Subcutaneous Q8H   insulin aspart  0-6 Units Subcutaneous TID WC   isosorbide-hydrALAZINE  1 tablet Oral TID   sevelamer carbonate  1,600 mg Oral TID WC   Continuous  Infusions:  ampicillin-sulbactam (UNASYN) IV Stopped (05/22/22 1821)   PRN Meds:.acetaminophen **OR** [DISCONTINUED] acetaminophen, alteplase, heparin, HYDROcodone-acetaminophen, lidocaine (PF), lidocaine-prilocaine, ondansetron **OR** ondansetron (ZOFRAN) IV, pentafluoroprop-tetrafluoroeth    Objective:   Vitals:   05/23/22 0015 05/23/22 0340 05/23/22 0804 05/23/22 0822  BP:  (!) 155/90 134/84 122/73  Pulse: 79 74 70 66  Resp: (!) 24 18 20  (!) 21  Temp:  97.9 F (36.6 C) 98.3 F (36.8 C)   TempSrc:  Oral Oral   SpO2: 99% 100% 100% 100%  Weight:   78.2 kg   Height:        Wt Readings from Last 3 Encounters:  05/23/22 78.2 kg  05/17/22 81.6 kg  05/13/22 62.6 kg     Intake/Output Summary (Last 24 hours) at 05/23/2022 0909 Last data filed at 05/23/2022 0340 Gross per 24 hour  Intake 340 ml  Output --  Net 340 ml     Physical Exam  Awake Alert, No new F.N deficits,   [place].AT,PERRAL Supple Neck, No JVD,   Symmetrical Chest wall movement, Good air movement bilaterally, CTAB RRR,No Gallops, Rubs or new Murmurs,  +ve B.Sounds, Abd Soft, No tenderness,   No Cyanosis, Clubbing or edema      Data Review:    Recent Labs  Lab 05/20/22 0505 05/20/22 0516 05/21/22 0457 05/23/22 0407  WBC 10.7*  --  9.2 7.7  HGB 9.5* 11.2* 8.1* 8.4*  HCT 31.6* 33.0* 25.2* 27.8*  PLT 389  --  266 315  MCV 94.3  --  89.4 93.6  MCH 28.4  --  28.7 28.3  MCHC 30.1  --  32.1 30.2  RDW 14.5  --  14.2 14.2  LYMPHSABS 0.5*  --   --  0.9  MONOABS 0.7  --   --  0.9  EOSABS 0.1  --   --  0.2  BASOSABS 0.0  --   --  0.0    Recent Labs  Lab 05/17/22 0129 05/20/22 0505 05/20/22 0516 05/21/22 0457 05/21/22 0657 05/23/22 0407  NA 140 137 140 136  --  136  K 4.4 4.3 4.4 4.7  --  4.2  CL 102 99 102 97*  --  96*  CO2 25 26  --  24  --  29  ANIONGAP 13 12  --  15  --  11  GLUCOSE 150* 158* 159* 107*  --  97  BUN 56* 47* 45* 59*  --  42*  CREATININE 9.10* 8.47* 8.90* 9.42*  --  7.44*  AST   --  28  --  19  --   --   ALT  --  22  --  19  --   --   ALKPHOS  --  192*  --  131*  --   --   BILITOT  --  0.5  --  0.6  --   --   ALBUMIN  --  3.2*  --  2.4*  --   --   CRP  --   --   --   --  12.6* 7.3*  LATICACIDVEN  --  1.1  --   --   --   --   HGBA1C  --   --   --  8.2*  --   --   BNP  --   --   --   --  >4,500.0* >4,500.0*  CALCIUM 7.4* 7.6*  --  8.0*  --  8.2*    Radiology Reports DG Chest Port 1 View  Result Date: 05/21/2022 CLINICAL DATA:  Shortness of breath. EXAM: PORTABLE CHEST 1 VIEW COMPARISON:  05/20/2022 FINDINGS: The cardio pericardial silhouette is enlarged. Persistent collapse/consolidation noted right lower lung with worsening atelectasis or infiltrate at the left base. Vascular congestion noted without overt airspace pulmonary edema. Bones are diffusely demineralized. Telemetry leads overlie the chest. IMPRESSION: Persistent collapse/consolidation right lower lung with worsening atelectasis or infiltrate at the left base. Electronically Signed   By: Misty Stanley M.D.   On: 05/21/2022 06:42   DG Chest Port 1 View  Result Date: 05/20/2022 CLINICAL DATA:  Fever and vomiting. EXAM: PORTABLE CHEST 1 VIEW COMPARISON:  May 12, 2022 FINDINGS: The cardiac silhouette is markedly enlarged and unchanged in size. Marked severity infiltrate is seen within the right lung base. There is no evidence of a pleural effusion or pneumothorax. The visualized skeletal structures are unremarkable. IMPRESSION: Stable cardiomegaly with marked severity right basilar infiltrate. Electronically Signed   By: Virgina Norfolk M.D.   On: 05/20/2022 04:14      Signature  -   Lala Lund M.D on 05/23/2022 at 9:09 AM   -  To page go to [email or web address]

## 2022-05-23 NOTE — Procedures (Addendum)
Seen and examined on dialysis.  Blood pressure 129/80 and HR .  Tolerating goal.  He has been on airborne precautions.  HD clotted while I was here.  Rinsed back.  We are sending back to his room - he is also ordered airborne precautions but was brought to the HD unit  He is out of 10 day window for isolation for covid (positive on 05/03/22).  He had RSV on 05/03/22 as well.    Unit is reaching out to ID for clarification and we can dialyze in his room if needed   Claudia Desanctis, MD 05/23/2022 9:44 AM  Spoke with ID.  Should be off of airborne per ID.  Continue masking and they will clarify isolation order.  We will resume his treatment in the HD unit   Claudia Desanctis, MD 10:03 AM 05/23/2022

## 2022-05-23 NOTE — Procedures (Signed)
HD Note:  Some information was entered later than the data was gathered due to patient care needs. The stated time with the data is accurate.  Received patient in bed to unit.  Alert and oriented.  Informed consent signed and in chart.   Patient R AVG has a higher pitch bruit than would be within normal expectations.  Patient became nauseated within minutes of treatment starting.  He vomited soon after.  Zofran given, see MAR.  Patient had no further complaints.         Dialyzer clotted at 0930, machine was re set up to continue treatment.               Access used: R AVG Access issues: None  Report given to oncoming dialysis nurse   [facility]

## 2022-05-23 NOTE — Progress Notes (Addendum)
[facility] Progress Note   Subjective: Seen in HD unit. Tolerating UF. On 2L [place].Endorses some coughing, chest pain improved. When asked if any difficulty breathing says "I don't know"   Objective Vitals:   05/23/22 0902 05/23/22 0930 05/23/22 1036 05/23/22 1128  BP: (!) (P) 143/86 134/80 137/84 (!) 153/83  Pulse: (P) 71 71 70 75  Resp: (P) 17 10  12   Temp:      TempSrc:      SpO2: (P) 100% 98% 100% 97%  Weight:      Height:         Additional Objective Labs: Basic Metabolic Panel: Recent Labs  Lab 05/20/22 0505 05/20/22 0516 05/21/22 0457 05/23/22 0407  NA 137 140 136 136  K 4.3 4.4 4.7 4.2  CL 99 102 97* 96*  CO2 26  --  24 29  GLUCOSE 158* 159* 107* 97  BUN 47* 45* 59* 42*  CREATININE 8.47* 8.90* 9.42* 7.44*  CALCIUM 7.6*  --  8.0* 8.2*  PHOS  --   --   --  4.2   CBC: Recent Labs  Lab 05/20/22 0505 05/20/22 0516 05/21/22 0457 05/23/22 0407  WBC 10.7*  --  9.2 7.7  NEUTROABS 9.4*  --   --  5.5  HGB 9.5* 11.2* 8.1* 8.4*  HCT 31.6* 33.0* 25.2* 27.8*  MCV 94.3  --  89.4 93.6  PLT 389  --  266 315   Blood Culture    Component Value Date/Time   SDES  05/20/2022 1138    BLOOD LEFT FOREARM Performed at [facility], [place] [address], [place], [place] [address]    SPECREQUEST  05/20/2022 1138    BOTTLES DRAWN AEROBIC AND ANAEROBIC Blood Culture adequate volume Performed at [facility] [address] [place],  [address]    CULT  05/20/2022 1138    NO GROWTH 3 DAYS Performed at [facility] [address] [place],  [address]    REPTSTATUS PENDING 05/20/2022 1138     Physical Exam General: Alert, nad, on nasal oxygen  Heart: RRR Lungs: Clear bilaterally  Abdomen: soft non -tender Extremities: No LE edema  Dialysis Access: RUE AVG in use on HD  Medications:  ampicillin-sulbactam (UNASYN) IV Stopped (05/22/22 1821)    calcium acetate  2,001 mg Oral TID   carvedilol  3.125 mg Oral  BID WC   Chlorhexidine Gluconate Cloth  6 each Topical Q0600   darbepoetin (ARANESP) injection - DIALYSIS  100 mcg Subcutaneous Q Wed-1800   famotidine  20 mg Oral Daily   feeding supplement (NEPRO CARB STEADY)  237 mL Oral TID WC   heparin  5,000 Units Subcutaneous Q8H   insulin aspart  0-6 Units Subcutaneous TID WC   isosorbide-hydrALAZINE  1 tablet Oral TID   sevelamer carbonate  1,600 mg Oral TID WC    Dialysis Orders:  TTS - [facility] 4hrs, BFR 400, DFR 800,  EDW 61.5kg, 2K/ 2.5Ca Heparin 2000 units with HD No VDRA/ESA ordered  Assessment/Plan: Strep pneumoniae bacteremia - IV antibiotics per primary  CAP -  per primary team  ESRD - on HD TTS. Continue on schedule. Next HD 1/6.  Hypertension/volume  - BP acceptable. Continue volume removal with HD.  Anemia of CKD - Hgb 8.1. Received Aranesp 100 on 1/4.  Secondary Hyperparathyroidism -  Corr Ca/Phos ok.  Continue binders.  Nutrition - Renal diet with fluid restriction, will add protein supplements Isolation - Covid + 12/15 -  off airborne precaution. Continue droplet precautions for RSV. Appreciate ID input.   Lynnda Child PA-C [facility] 05/23/2022,11:43 AM     Seen and examined independently.  Agree with note and exam as documented above by physician extender and as noted here.  See also procedure note from today  Claudia Desanctis, MD 05/23/2022  6:22 PM

## 2022-05-23 NOTE — Progress Notes (Signed)
Covid positive on 12/15 and now no longer needs airborne/contact isolation. Do recommend to continue with droplet isolate for RSV.

## 2022-05-23 NOTE — Progress Notes (Signed)
PT Cancellation Note  Patient Details Name: JOEDY EICKHOFF MRN: [id number] DOB: 28-May-1970   Cancelled Treatment:    Reason Eval/Treat Not Completed: Patient at procedure or test/unavailable. Pt in HD. PT to re-attempt as time allows.   Lorriane Shire 05/23/2022, 9:14 AM

## 2022-05-23 NOTE — Progress Notes (Signed)
   05/23/22 1356  Vitals  Temp 98 F (36.7 C)  Temp Source Oral  BP (!) 160/91  MAP (mmHg) 110  BP Location Left Arm  BP Method Automatic  Patient Position (if appropriate) Lying  Pulse Rate 74  Pulse Rate Source Monitor  ECG Heart Rate 76  Resp (!) 23  Oxygen Therapy  SpO2 100 %  O2 Device Nasal Cannula  O2 Flow Rate (L/min) 2 L/min  Pulse Oximetry Type Continuous   Received patient in bed to unit.  Alert and oriented.  Informed consent signed and in chart.   Treatment initiated: 0822  Treatment completed: 1328  Patient tolerated well.  Transported back to the room  Alert, without acute distress.  Hand-off given to patient's nurse.   Access used: AVG Access issues: NA  Total UF removed: 3723ml Medication(s) given: NA Post HD VS: see above Post HD weight: 66.4kg   [name]

## 2022-05-23 NOTE — Plan of Care (Signed)

## 2022-05-24 ENCOUNTER — Encounter (HOSPITAL_COMMUNITY): Payer: Commercial Managed Care - HMO | Admitting: Cardiology

## 2022-05-24 ENCOUNTER — Inpatient Hospital Stay (HOSPITAL_COMMUNITY): Payer: Medicaid Other

## 2022-05-24 DIAGNOSIS — R7881 Bacteremia: Secondary | ICD-10-CM | POA: Diagnosis not present

## 2022-05-24 DIAGNOSIS — J189 Pneumonia, unspecified organism: Secondary | ICD-10-CM | POA: Diagnosis not present

## 2022-05-24 LAB — ECHOCARDIOGRAM COMPLETE
AR max vel: 2.56 cm2
AV Area VTI: 2.44 cm2
AV Area mean vel: 2.45 cm2
AV Mean grad: 5 mmHg
AV Peak grad: 9.1 mmHg
Ao pk vel: 1.51 m/s
Area-P 1/2: 3.65 cm2
Height: 67 in
S' Lateral: 3.6 cm
Weight: 2342.17 oz

## 2022-05-24 LAB — GLUCOSE, CAPILLARY
Glucose-Capillary: 160 mg/dL — ABNORMAL HIGH (ref 70–99)
Glucose-Capillary: 73 mg/dL (ref 70–99)
Glucose-Capillary: 153 mg/dL — ABNORMAL HIGH (ref 70–99)
Glucose-Capillary: 125 mg/dL — ABNORMAL HIGH (ref 70–99)

## 2022-05-24 MED ORDER — CHLORHEXIDINE GLUCONATE CLOTH 2 % EX PADS: 6.0000 | MEDICATED_PAD | Freq: Every day | CUTANEOUS | Status: DC

## 2022-05-24 NOTE — Progress Notes (Signed)
PT Cancellation Note  Patient Details Name: [name] MRN: [id number] DOB: 1971/01/04   Cancelled Treatment:    Reason Eval/Treat Not Completed: Patient at procedure or test/unavailable. Pt declined initial PT attempt this AM, requesting PT come back before lunch. Upon return, transport in room, taking pt for chest CT. PT to re-attempt as time allows.   Lorriane Shire 05/24/2022, 10:53 AM

## 2022-05-24 NOTE — Progress Notes (Signed)
PROGRESS NOTE                                                                                                                                                                                                             Patient Demographics:    [name], is a 52 y.o. male, DOB - [birth year], RSW:[id number]  Outpatient Primary MD for the patient is [facility]    LOS - 4  Admit date - 05/20/2022    Chief Complaint  Patient presents with   Emesis       Brief Narrative (HPI from H&P)    52 y.o. male with medical history significant of anxiety, depression, suicidal ideations, insomnia, homelessness, type II DM, elevated alkaline phosphatase level, hypertension, hyperlipidemia, left bundle branch block, history of pneumonia, vitamin D deficiency, ESRD on hemodialysis who came into the emergency department due to nausea, vomiting, RLL chest and abdominal pain.  Having pain in the right lower chest which is pleuritic in nature worse with deep breaths and cough along with fever for the last 7 to 8 days, he claims he was diagnosed with COVID about 10 days ago, came to the hospital where he was diagnosed with pneumonia and admitted to the hospital.   Subjective:   Patient in bed, appears comfortable, denies any headache, no fever, no chest pain or pressure, no shortness of breath , no abdominal pain. No new focal weakness.   Assessment  & Plan :   Right sided chest wall pleuritic chest pain in the setting of community-acquired pneumonia with Streptococcus pneumonia bacteremia.  Recent diagnosis of COVID ?,  Will repeat respiratory viral panel, so some nausea vomiting with questionable aspiration, on Unasyn, speech therapy following, clinically better sepsis pathophysiology not present.  Patient noncompliant with blood draws, refused blood draw on 05/22/2022, refused CT scan of chest on 05/21/2022 and again on 05/22/2022.  Counseled.  Will  monitor with IV antibiotics on board.  Encouraged to sit in chair use I-S and flutter valve in daytime for pulmonary toiletry.  Although he is clinically much improved and appears nontoxic continues to have persistent strep pneumo bacteremia, he refused CT scan of the chest to rule out empyema and echocardiogram.  Counseled and reordered again on 05/24/2022.  Continue to monitor.  ESRD.  On TTS schedule nephrology called.  Anemia of chronic disease.  Stable.  Essential hypertension.  Switch Norvasc to Coreg and BiDil combination.  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  Currently compensated, HD for any fluid removal.  Will place on low-dose Coreg along with BiDil, avoiding ACE/ARB due to ESRD.  DM Type II.  ISS.  Lab Results  Component Value Date   HGBA1C 8.2 (H) 05/21/2022   CBG (last 3)  Recent Labs    05/23/22 1531 05/23/22 2151 05/24/22 0813  GLUCAP 118* 186* 160*         Condition - Fair  Family Communication  :  None present  Code Status :  FRull  Consults  :  Renal  PUD Prophylaxis : Pepcid   Procedures  :     CTA - pt refused  CT chest noncontrast reordered patient counseled  Echocardiogram reordered patient refused the first 1.      Disposition Plan  :    Status is: Inpatient  DVT Prophylaxis  :    heparin injection 5,000 Units Start: 05/20/22 1400   Lab Results  Component Value Date   PLT 315 05/23/2022    Diet :  Diet Order             Diet renal/carb modified with fluid restriction Diet-HS Snack? Nothing; Fluid restriction: 1200 mL Fluid; Room service appropriate? Yes; Fluid consistency: Thin  Diet effective now                    Inpatient Medications  Scheduled Meds:  calcium acetate  2,001 mg Oral TID   carvedilol  3.125 mg Oral BID WC   Chlorhexidine Gluconate Cloth  6 each Topical Q0600   [START ON 05/30/2022] darbepoetin (ARANESP) injection - DIALYSIS  100 mcg Subcutaneous Q Thu-1800   famotidine   20 mg Oral Daily   feeding supplement (NEPRO CARB STEADY)  237 mL Oral TID WC   heparin  5,000 Units Subcutaneous Q8H   insulin aspart  0-6 Units Subcutaneous TID WC   isosorbide-hydrALAZINE  1 tablet Oral TID   sevelamer carbonate  1,600 mg Oral TID WC   Continuous Infusions:  ampicillin-sulbactam (UNASYN) IV 3 g (05/23/22 1740)   PRN Meds:.acetaminophen **OR** [DISCONTINUED] acetaminophen, guaiFENesin-dextromethorphan, HYDROcodone-acetaminophen, ondansetron **OR** ondansetron (ZOFRAN) IV    Objective:   Vitals:   05/23/22 2128 05/24/22 0332 05/24/22 0811 05/24/22 0915  BP: (!) 154/81 131/77 (!) 151/81   Pulse: 76 76 82 82  Resp: 17 18 17 14   Temp: 98.7 F (37.1 C) 98.7 F (37.1 C) 98.7 F (37.1 C)   TempSrc: Oral Oral Oral   SpO2: 98% 99%  96%  Weight:      Height:        Wt Readings from Last 3 Encounters:  05/23/22 66.4 kg  05/17/22 81.6 kg  05/13/22 62.6 kg     Intake/Output Summary (Last 24 hours) at 05/24/2022 1025 Last data filed at 05/23/2022 2000 Gross per 24 hour  Intake 240 ml  Output 3700 ml  Net -3460 ml     Physical Exam  Awake Alert, No new F.N deficits, Normal affect [place].AT,PERRAL Supple Neck, No JVD,   Symmetrical Chest wall movement, Good air movement bilaterally, CTAB RRR,No Gallops, Rubs or new Murmurs,  +ve B.Sounds, Abd Soft, No tenderness,   No Cyanosis, Clubbing or edema      Data Review:    Recent Labs  Lab 05/20/22 0505 05/20/22 0516 05/21/22 0457 05/23/22 0407  WBC 10.7*  --  9.2  7.7  HGB 9.5* 11.2* 8.1* 8.4*  HCT 31.6* 33.0* 25.2* 27.8*  PLT 389  --  266 315  MCV 94.3  --  89.4 93.6  MCH 28.4  --  28.7 28.3  MCHC 30.1  --  32.1 30.2  RDW 14.5  --  14.2 14.2  LYMPHSABS 0.5*  --   --  0.9  MONOABS 0.7  --   --  0.9  EOSABS 0.1  --   --  0.2  BASOSABS 0.0  --   --  0.0    Recent Labs  Lab 05/20/22 0505 05/20/22 0516 05/21/22 0457 05/21/22 0657 05/23/22 0407  NA 137 140 136  --  136  K 4.3 4.4 4.7  --  4.2   CL 99 102 97*  --  96*  CO2 26  --  24  --  29  ANIONGAP 12  --  15  --  11  GLUCOSE 158* 159* 107*  --  97  BUN 47* 45* 59*  --  42*  CREATININE 8.47* 8.90* 9.42*  --  7.44*  AST 28  --  19  --   --   ALT 22  --  19  --   --   ALKPHOS 192*  --  131*  --   --   BILITOT 0.5  --  0.6  --   --   ALBUMIN 3.2*  --  2.4*  --   --   CRP  --   --   --  12.6* 7.3*  LATICACIDVEN 1.1  --   --   --   --   HGBA1C  --   --  8.2*  --   --   BNP  --   --   --  >4,500.0* >4,500.0*  CALCIUM 7.6*  --  8.0*  --  8.2*    Radiology Reports DG Chest Port 1 View  Result Date: 05/21/2022 CLINICAL DATA:  Shortness of breath. EXAM: PORTABLE CHEST 1 VIEW COMPARISON:  05/20/2022 FINDINGS: The cardio pericardial silhouette is enlarged. Persistent collapse/consolidation noted right lower lung with worsening atelectasis or infiltrate at the left base. Vascular congestion noted without overt airspace pulmonary edema. Bones are diffusely demineralized. Telemetry leads overlie the chest. IMPRESSION: Persistent collapse/consolidation right lower lung with worsening atelectasis or infiltrate at the left base. Electronically Signed   By: Misty Stanley M.D.   On: 05/21/2022 06:42      Signature  -   Lala Lund M.D on 05/24/2022 at 10:25 AM   -  To page go to [email or web address]

## 2022-05-24 NOTE — Progress Notes (Signed)
Placing a progress note as requested by Dr Thurnell Lose, we brought this pt down for his ct and he did not want to give [place] his name and dob to verify his armband placement (he came down without an armband) and he refused to be placed on the table.  Pt was returned to his room until he was amendable to getting his scan.

## 2022-05-24 NOTE — Progress Notes (Addendum)
[facility] Progress Note   Subjective: Seen in HD unit. Completed dialysis yesterday. Net UF 3.7L Breathing ok, still feels like his feet are swollen. Says will have Echo today.    Objective Vitals:   05/23/22 2128 05/24/22 0332 05/24/22 0811 05/24/22 0915  BP: (!) 154/81 131/77 (!) 151/81   Pulse: 76 76 82 82  Resp: 17 18 17 14   Temp: 98.7 F (37.1 C) 98.7 F (37.1 C) 98.7 F (37.1 C)   TempSrc: Oral Oral Oral   SpO2: 98% 99%  96%  Weight:      Height:         Additional Objective Labs: Basic Metabolic Panel: Recent Labs  Lab 05/20/22 0505 05/20/22 0516 05/21/22 0457 05/23/22 0407  NA 137 140 136 136  K 4.3 4.4 4.7 4.2  CL 99 102 97* 96*  CO2 26  --  24 29  GLUCOSE 158* 159* 107* 97  BUN 47* 45* 59* 42*  CREATININE 8.47* 8.90* 9.42* 7.44*  CALCIUM 7.6*  --  8.0* 8.2*  PHOS  --   --   --  4.2    CBC: Recent Labs  Lab 05/20/22 0505 05/20/22 0516 05/21/22 0457 05/23/22 0407  WBC 10.7*  --  9.2 7.7  NEUTROABS 9.4*  --   --  5.5  HGB 9.5* 11.2* 8.1* 8.4*  HCT 31.6* 33.0* 25.2* 27.8*  MCV 94.3  --  89.4 93.6  PLT 389  --  266 315    Blood Culture    Component Value Date/Time   SDES  05/20/2022 1138    BLOOD LEFT FOREARM Performed at [facility], [place] [address] [place], [place] [address]    SPECREQUEST  05/20/2022 1138    BOTTLES DRAWN AEROBIC AND ANAEROBIC Blood Culture adequate volume Performed at [facility] [address], [place], [place] [address]    CULT  05/20/2022 1138    NO GROWTH 3 DAYS Performed at [facility] [address] [place], [place] [address]    REPTSTATUS PENDING 05/20/2022 1138     Physical Exam General: Alert, nad, on nasal oxygen  Heart: RRR Lungs: Clear bilaterally  Abdomen: soft non -tender Extremities: No LE edema  Dialysis Access: RUE AVG in use on HD  Medications:  ampicillin-sulbactam (UNASYN) IV 3 g (05/23/22 1740)    calcium acetate  2,001 mg Oral  TID   carvedilol  3.125 mg Oral BID WC   Chlorhexidine Gluconate Cloth  6 each Topical Q0600   [START ON 05/30/2022] darbepoetin (ARANESP) injection - DIALYSIS  100 mcg Subcutaneous Q Thu-1800   famotidine  20 mg Oral Daily   feeding supplement (NEPRO CARB STEADY)  237 mL Oral TID WC   heparin  5,000 Units Subcutaneous Q8H   insulin aspart  0-6 Units Subcutaneous TID WC   isosorbide-hydrALAZINE  1 tablet Oral TID   sevelamer carbonate  1,600 mg Oral TID WC    Dialysis Orders:  TTS - [facility] 4hrs, BFR 400, DFR 800,  EDW 61.5kg, 2K/ 2.5Ca Heparin 2000 units with HD No VDRA/ESA ordered  Assessment/Plan: Strep pneumoniae bacteremia - IV antibiotics per primary. CAP -  per primary team  ESRD - on HD TTS. Continue on schedule. Next HD 1/6.  Hypertension/volume  - BP acceptable. Continue volume removal with HD.  Anemia of CKD - Hgb 8.4. Received Aranesp 100 on 1/4. Tsat 18% Hold IV Fe d/t active infection.  Secondary Hyperparathyroidism -  Corr Ca/Phos ok.  Continue binders.  Nutrition - Renal diet with fluid restriction, will add protein supplements Isolation - Covid + 12/15 -off airborne precaution. Continue droplet precautions for RSV. Appreciate ID input.   Lynnda Child PA-C [facility] 05/24/2022,10:36 AM   Seen and examined independently.  Agree with note and exam as documented above by physician extender and as noted here.  per charting patient refused to give his name downstairs and he ultimately was sent back to his room and didn't get his CT scan.  We discussed rationale for the test and appropriate behavior.  He is willing to get the scan and I asked him to reach out to his providers  General adult male in bed in no acute distress HEENT normocephalic atraumatic extraocular movements intact sclera anicteric Neck supple trachea midline Lungs clear to auscultation bilaterally normal work of breathing at rest on 1 liter oxygen  Heart S1S2 no rub Abdomen soft  nontender nondistended Extremities no edema  Psych no anxiety or agitation  Access RUE AV graft with bruit   CAP - abx per primary team    Streptococcus pneumoniae bacteremia - on unasyn - antibiotics per primary team.  Would need at least two weeks of antibiotics for bacteremia.  One set of blood cultures from 1/4 is NGTD the other is inadequate volume of blood.  One set of cultures from 1/1 is negative and one is positive. Team has ordered TTE and CT.     ESRD - HD per TTS schedule. No labs yet today   Anemia of CKD - aranesp 100 mcg given 1/4 and ordered for weekly on thursdays    Disposition - would continue inpatient monitoring.  Reached out to primary re: the CT scan  Claudia Desanctis, MD 05/24/2022 12:30 PM

## 2022-05-24 NOTE — Progress Notes (Signed)
  Echocardiogram 2D Echocardiogram has been performed.  [name] 05/24/2022, 2:59 PM

## 2022-05-24 NOTE — Progress Notes (Signed)
Patient was taken down to CT for his scan, once in CT, patient refused to tell staff his name, therefore scan was not completed and patient was brought back to his room. I have notified Dr. Candiss Norse.

## 2022-05-25 DIAGNOSIS — J189 Pneumonia, unspecified organism: Secondary | ICD-10-CM | POA: Diagnosis not present

## 2022-05-25 LAB — CBC WITH DIFFERENTIAL/PLATELET
Abs Immature Granulocytes: 0.14 10*3/uL — ABNORMAL HIGH (ref 0.00–0.07)
Basophils Absolute: 0.1 10*3/uL (ref 0.0–0.1)
Basophils Relative: 1 %
Eosinophils Absolute: 0.3 10*3/uL (ref 0.0–0.5)
Eosinophils Relative: 4 %
HCT: 27.8 % — ABNORMAL LOW (ref 39.0–52.0)
Hemoglobin: 8.4 g/dL — ABNORMAL LOW (ref 13.0–17.0)
Immature Granulocytes: 2 %
Lymphocytes Relative: 12 %
Lymphs Abs: 1 10*3/uL (ref 0.7–4.0)
MCH: 28.4 pg (ref 26.0–34.0)
MCHC: 30.2 g/dL (ref 30.0–36.0)
MCV: 93.9 fL (ref 80.0–100.0)
Monocytes Absolute: 1.3 10*3/uL — ABNORMAL HIGH (ref 0.1–1.0)
Monocytes Relative: 15 %
Neutro Abs: 5.6 10*3/uL (ref 1.7–7.7)
Neutrophils Relative %: 66 %
Platelets: 336 10*3/uL (ref 150–400)
RBC: 2.96 MIL/uL — ABNORMAL LOW (ref 4.22–5.81)
RDW: 14.4 % (ref 11.5–15.5)
WBC: 8.3 10*3/uL (ref 4.0–10.5)
nRBC: 0 % (ref 0.0–0.2)

## 2022-05-25 LAB — BASIC METABOLIC PANEL
Anion gap: 14 (ref 5–15)
BUN: 38 mg/dL — ABNORMAL HIGH (ref 6–20)
CO2: 27 mmol/L (ref 22–32)
Calcium: 8.4 mg/dL — ABNORMAL LOW (ref 8.9–10.3)
Chloride: 96 mmol/L — ABNORMAL LOW (ref 98–111)
Creatinine, Ser: 7.65 mg/dL — ABNORMAL HIGH (ref 0.61–1.24)
GFR, Estimated: 8 mL/min — ABNORMAL LOW (ref >60)
Glucose, Bld: 113 mg/dL — ABNORMAL HIGH (ref 70–99)
Potassium: 4.6 mmol/L (ref 3.5–5.1)
Sodium: 137 mmol/L (ref 135–145)

## 2022-05-25 LAB — GLUCOSE, CAPILLARY
Glucose-Capillary: 116 mg/dL — ABNORMAL HIGH (ref 70–99)
Glucose-Capillary: 204 mg/dL — ABNORMAL HIGH (ref 70–99)
Glucose-Capillary: 137 mg/dL — ABNORMAL HIGH (ref 70–99)

## 2022-05-25 LAB — C-REACTIVE PROTEIN: CRP: 6.2 mg/dL — ABNORMAL HIGH (ref <1.0)

## 2022-05-25 LAB — BRAIN NATRIURETIC PEPTIDE: B Natriuretic Peptide: 3829.2 pg/mL — ABNORMAL HIGH (ref 0.0–100.0)

## 2022-05-25 MED ORDER — ACETAMINOPHEN 500 MG PO TABS: 500.0000 mg | ORAL_TABLET | Freq: Three times a day (TID) | ORAL | 0 refills | Status: DC | PRN

## 2022-05-25 MED ORDER — PENTAFLUOROPROP-TETRAFLUOROETH EX AERO
INHALATION_SPRAY | CUTANEOUS | Status: AC
  Filled 2022-05-25: qty 30

## 2022-05-25 MED ORDER — AMOXICILLIN-POT CLAVULANATE 250-125 MG PO TABS
2.0000 | ORAL_TABLET | Freq: Two times a day (BID) | ORAL | Status: DC
  Administered 2022-05-25 – 2022-05-27 (×5): 2 via ORAL
  Filled 2022-05-25 (×8): qty 2

## 2022-05-25 MED ORDER — ISOSORB DINITRATE-HYDRALAZINE 20-37.5 MG PO TABS: 1.0000 | ORAL_TABLET | Freq: Three times a day (TID) | ORAL | 0 refills | Status: DC

## 2022-05-25 MED ORDER — INSULIN ASPART 100 UNIT/ML FLEXPEN: PEN_INJECTOR | SUBCUTANEOUS | 0 refills | Status: DC

## 2022-05-25 MED ORDER — CARVEDILOL 3.125 MG PO TABS: 3.1250 mg | ORAL_TABLET | Freq: Two times a day (BID) | ORAL | 0 refills | Status: DC

## 2022-05-25 MED ORDER — ASPIRIN LOW DOSE 81 MG PO TBEC: 81.0000 mg | DELAYED_RELEASE_TABLET | Freq: Every day | ORAL | 0 refills | Status: DC

## 2022-05-25 MED ORDER — "INSULIN SYRINGE-NEEDLE U-100 25G X 1"" 1 ML MISC": 0 refills | Status: DC

## 2022-05-25 MED ORDER — BLOOD GLUCOSE MONITOR KIT: PACK | 0 refills | Status: DC

## 2022-05-25 MED ORDER — AMOXICILLIN-POT CLAVULANATE 250-125 MG PO TABS: 2.0000 | ORAL_TABLET | Freq: Two times a day (BID) | ORAL | 0 refills | Status: DC

## 2022-05-25 MED ORDER — PROTONIX 40 MG PO TBEC: 40.0000 mg | DELAYED_RELEASE_TABLET | Freq: Every day | ORAL | 0 refills | Status: DC

## 2022-05-25 NOTE — Progress Notes (Signed)
Pt transport called me to let me know the pt "got the wrong breakfast tray and wants to eat breakfast before coming". And wanted to be rescheduled. I explained that would be a lot of money wasted as the resources used to set up his tx would have to be thrown away. I further explained that d/t staffing issues we would not be able to offer him another spot today so he needed to come now. We have breakfast sandwiches on the unit IF his bp accomodates. We continued to wait on the pt and I eventually called his primary nurse when we saw he had been taken out of the tele tracking system and no one had let us know. His primary nurse made me aware that it wasn't the food situation that was keeping him from coming that it was just the fact that he didn't want to come. I asked to clarify was she saying he was refusing tx and she stated that he was. She also stated that she had let him know that if he refused tx now that we could not offer it to him later d/t staffing issues. She stated that he was aware and ok w/ that. Dr. Augustin Coupe was made aware of pt's refusal when he came and made rounds in the unit.

## 2022-05-25 NOTE — Plan of Care (Signed)

## 2022-05-25 NOTE — Progress Notes (Signed)
Discharge paperwork reviewed with patient at this time. Disposable clothing provided upon discharge for patient and Ivs have been removed. Patient states "Do I have to be discharged? I do not have anywhere to go and it is cold outside." Social Work notified. MD notified.

## 2022-05-25 NOTE — Discharge Summary (Addendum)
[name] UKG:[id number] DOB: 1971-02-03 DOA: 05/20/2022  PCP: [facility]  Admit date: 05/20/2022  Discharge date: 05/28/2022  Admitted From: Home   Disposition:  Home   Recommendations for Outpatient Follow-up:   Follow up with PCP in 1-2 weeks  PCP Please obtain BMP/CBC, 2 view CXR in 1week,  (see Discharge instructions)   PCP Please follow up on the following pending results: Please arrange for outpatient cardiology follow-up in 1 to 2 weeks.     Home Health: None   Equipment/Devices: None  Consultations: Renal Discharge Condition: Stable    CODE STATUS: Full    Diet Recommendation: Renal-low carbohydrate diet with 1.5 L fluid restriction    Chief Complaint  Patient presents with   Emesis     Brief history of present illness from the day of admission and additional interim summary    52 y.o. male with medical history significant of anxiety, depression, suicidal ideations, insomnia, homelessness, type II DM, elevated alkaline phosphatase level, hypertension, hyperlipidemia, left bundle branch block, history of pneumonia, vitamin D deficiency, ESRD on hemodialysis who came into the emergency department due to nausea, vomiting, RLL chest and abdominal pain.  Having pain in the right lower chest which is pleuritic in nature worse with deep breaths and cough along with fever for the last 7 to 8 days, he claims he was diagnosed with COVID about 10 days ago, came to the hospital where he was diagnosed with pneumonia and admitted to the hospital.                                                                  Hospital Course   Right sided chest wall pleuritic chest pain in the setting of community-acquired pneumonia with Streptococcus pneumonia bacteremia.  Was treated with IV Unasyn along with supportive  care with I-S and flutter valve for pulmonary toiletry, CT chest also noted, patient very noncompliant with medications, lab draws and tests.  CT chest was done on fifth attempt after he refused the first 4, he has refused multiple HD runs, does not take medications on time despite extensive counseling.  Today he is abusive to the staff and wants to be discharged home ASAP, repeat blood cultures negative will be placed on oral Augmentin for 7 more days and will be discharged home with outpatient PCP follow-up.  COVID-positive for the last at least 12 to 15 days.  No further treatment needed.  Now stable on room air upon rest and ambulation will be discharged to a shelter per social work.  ESRD.  On TTS schedule nephrology called.  He underwent routine dialysis treatments but today adamant that he does not feel like going to the dialysis unit, will follow-up with his nephrologist outpatient postdischarge.   Anemia of chronic disease.  Stable.  Essential hypertension.  Switched Norvasc to Coreg and BiDil combination.  He was noncompliant and was not taking any medications at home at all.  Will be given prescriptions for BiDil and Coreg for a month.   Chronic combined systolic and diastolic CHF EF 35% in 5732.  Currently compensated, HD for any fluid removal.  Will place on low-dose Coreg along with BiDil, avoiding ACE/ARB due to ESRD.  Counseled on compliance with medications he stopped taking all his medications several mths ago.  PCP to arrange outpatient cardiology follow-up.  Does not want to see cardiologist at the hospital.   DM Type II.  Not taking any medications whatsoever for diabetes for several months, counseled on compliance will be discharged on sliding scale along with testing supplies.  Received diabetic and insulin education.  Lab Results  Component Value Date   HGBA1C 8.2 (H) 05/21/2022     Discharge diagnosis     Principal Problem:   CAP (community acquired pneumonia) Active  Problems:   ESRD (end stage renal disease) ([place])   Anemia due to chronic kidney disease   Essential hypertension   Hyperlipidemia LDL goal <70   Type 2 diabetes mellitus ([place])   Chronic combined systolic and diastolic congestive heart failure [facility])    Discharge instructions    Discharge Instructions     MyChart COVID-19 home monitoring program   Complete by: May 25, 2022    Is the patient willing to use the [facility] for home monitoring?: Yes   Temperature monitoring   Complete by: May 25, 2022    After how many days would you like to receive a notification of this patient's flowsheet entries?: 1   Discharge instructions   Complete by: As directed    Follow with Primary MD [facility] Rosales, Clinic in 7 days get a referral to cardiologist within a week of discharge.  Get CBC, CMP, 2 view Chest X ray -  checked next visit with your primary MD   Activity: As tolerated with Full fall precautions use walker/cane & assistance as needed  Disposition Home    Diet: Renal diet, low carbohydrate diet with strict 1.5 L fluid restriction per day.  Check your Weight same time everyday, if you gain over 2 pounds, or you develop in leg swelling, experience more shortness of breath or chest pain, call your Primary MD immediately. Follow Cardiac Low Salt Diet and 1.5 lit/day fluid restriction.  Accuchecks 4 times/day, Once in AM empty stomach and then before each meal. Log in all results and show them to your Prim.MD in 3 days. If any glucose reading is under 80 or above 300 call your Prim MD immidiately. Follow Low glucose instructions for glucose under 80 as instructed.   Special Instructions: If you have smoked or chewed Tobacco  in the last 2 yrs please stop smoking, stop any regular Alcohol  and or any Recreational drug use.  On your next visit with your primary care physician please Get Medicines reviewed and adjusted.  Please request your Prim.MD to go over all Hospital Tests and  Procedure/Radiological results at the follow up, please get all Hospital records sent to your Prim MD by signing hospital release before you go home.  If you experience worsening of your admission symptoms, develop shortness of breath, life threatening emergency, suicidal or homicidal thoughts you must seek medical attention immediately by calling 911 or calling your MD immediately  if symptoms less severe.  You Must read complete instructions/literature along with all  the possible adverse reactions/side effects for all the Medicines you take and that have been prescribed to you. Take any new Medicines after you have completely understood and accpet all the possible adverse reactions/side effects.   Increase activity slowly   Complete by: As directed        Discharge Medications   Allergies as of 05/28/2022       Reactions   Sulfa Antibiotics Rash   Severe rash.    Keflex [cephalexin] Other (See Comments)   Patient with severe drug reaction including exfoliating skin rash and hypotension after being prescribed both cephalexin and sulfamethoxazole-trimethoprim simultaneously. Favor SMX as much more likely culprit as patient had tolerated other cephalosporins in the past, but cannot say with complete certainty that this was not related to cephalexin.    Other Other (See Comments)   Patient states he is allergic to the TB test - unknown reaction during childhood    Bactrim [sulfamethoxazole-trimethoprim] Rash        Medication List     STOP taking these medications    escitalopram 5 MG tablet Commonly known as: Lexapro   insulin glargine 100 UNIT/ML Solostar Pen Commonly known as: LANTUS   Levemir FlexPen 100 UNIT/ML FlexPen Generic drug: insulin detemir   phenazopyridine 200 MG tablet Commonly known as: PYRIDIUM       TAKE these medications    Accu-Chek Guide test strip Generic drug: glucose blood Use to check fasting blood sugar once daily. 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 Insulin  dependent   acetaminophen 500 MG tablet Commonly known as: TYLENOL Take 1 tablet (500 mg total) by mouth every 8 (eight) hours as needed for mild pain.   amLODipine 10 MG tablet Commonly known as: NORVASC Take 1 tablet (10 mg total) by mouth daily. What changed:  how much to take how to take this when to take this   amoxicillin-clavulanate 500-125 MG tablet Commonly known as: Augmentin Take 1 tablet by mouth 2 (two) times daily for 14 doses.   Aspirin Low Dose 81 MG tablet Generic drug: aspirin EC Take 1 tablet (81 mg total) by mouth daily.   blood glucose meter kit and supplies Kit Dispense based on patient and insurance preference. Use up to four times daily as directed. (FOR ICD-9 250.00, 250.01). For QAC - HS accuchecks.   blood glucose meter kit and supplies Kit Dispense based on patient and insurance preference. Use up to four times daily as directed. (FOR ICD-9 250.00, 250.01). For QAC - HS accuchecks.   calcium acetate 667 MG capsule Commonly known as: PHOSLO Take 3 capsules (2,001 mg total) by mouth 3 (three) times daily.   carvedilol 6.25 MG tablet Commonly known as: COREG Take 1 tablet (6.25 mg total) by mouth 2 (two) times daily with a meal.   insulin aspart 100 UNIT/ML FlexPen Commonly known as: NOVOLOG Before each meal 3 times a day, 140-199 - 2 units, 200-250 - 4 units, 251-299 - 6 units,  300-349 - 8 units,  350 or above 10 units. What changed:  how much to take how to take this when to take this additional instructions   Insulin Pen Needle 32G X 4 MM Misc Use with insulin pens   Insulin Syringe-Needle U-100 25G X 1" 1 ML Misc For 4 times a day insulin SQ, 1 month supply. Diagnosis E11.65   isosorbide-hydrALAZINE 20-37.5 MG tablet Commonly known as: BIDIL Take 1 tablet by mouth 3 (three) times daily.   Protonix 40 MG tablet Generic drug: pantoprazole  Take 1 tablet (40 mg total) by mouth daily.   sevelamer carbonate 800 MG tablet Commonly  known as: Renvela Take 2 tablets by mouth 3 times daily with meals and 1 tablet twice daily with snacks. (Take 2 tablets (1,600 mg total) by mouth 3 (three) times daily with meals. May also take 1 tablet (800 mg total) 2 (two) times daily as needed.) What changed: See the new instructions.   sitaGLIPtin 25 MG tablet Commonly known as: JANUVIA take 1 tablet by mouth daily   sodium bicarbonate 650 MG tablet Take 650 mg by mouth daily.   triamcinolone cream 0.1 % Commonly known as: KENALOG Apply 1 Application topically 2 (two) times daily.               Durable Medical Equipment  (From admission, onward)           Start     Ordered   05/26/22 0931  For home use only DME oxygen  Once       Question Answer Comment  Length of Need 6 Months   Mode or (Route) Nasal cannula   Liters per Minute 2   Frequency Continuous (stationary and portable oxygen unit needed)   Oxygen conserving device Yes   Oxygen delivery system Gas      05/26/22 0931             Follow-up No Name, [facility]. Go on 05/25/2022.   Why: Schedule is Tuesday/Thursday/Saturday. Arrive at 6:20 am for 6:40 am chair time.  Transportation with [place] [phone]) arranged by clinic social worker. Transportation will pick patient up at 5:50 am at [facility] and drop patient off at [facility] around 11:00 am on TTS. Contact information: [address] [place] [address] [facility]. Schedule an appointment as soon as possible for a visit in 1 week(s).   Why: Please get a referral to a cardiologist within 1 to 2 weeks of discharge.                Major procedures and Radiology Reports - PLEASE review detailed and final reports thoroughly  -       ECHOCARDIOGRAM COMPLETE  Result Date: 05/24/2022    ECHOCARDIOGRAM REPORT   Patient Name:   DEVONTRE SIEDSCHLAG Date of Exam: 05/24/2022 Medical Rec #:  [id number]       Height:       67.0 in Accession #:     [id number]      Weight:       146.4 lb Date of Birth:  [birth year]      BSA:          1.771 m Patient Age:    89 years        BP:           141/76 mmHg Patient Gender: M               HR:           73 bpm. Exam Location:  Inpatient Procedure: 2D Echo, Cardiac Doppler and Color Doppler Indications:    Bacteremia  History:        Patient has prior history of Echocardiogram examinations, most                 recent 11/27/2020. CHF, Arrythmias:LBBB; Risk                 Factors:Hypertension, Dyslipidemia and Diabetes.  ESRD.  Sonographer:    Clayton Lefort RDCS (AE) Referring Phys: [id number] [place]  1. Left ventricular ejection fraction, by estimation, is 25 to 30%. The left ventricle has severely decreased function. The left ventricle demonstrates global hypokinesis. There is severe concentric left ventricular hypertrophy. Left ventricular diastolic parameters are consistent with Grade II diastolic dysfunction (pseudonormalization).  2. Right ventricular systolic function is normal. The right ventricular size is normal. There is moderately elevated pulmonary artery systolic pressure.  3. Right atrial size was moderately dilated.  4. A small pericardial effusion is present. The pericardial effusion is circumferential.  5. No evidence of mitral valve regurgitation.  6. Tricuspid valve regurgitation is moderate.  7. The aortic valve is tricuspid. Aortic valve regurgitation is not visualized. Aortic valve sclerosis is present, with no evidence of aortic valve stenosis.  8. The inferior vena cava is dilated in size with >50% respiratory variability, suggesting right atrial pressure of 8 mmHg. Comparison(s): No significant change from prior study. FINDINGS  Left Ventricle: Left ventricular ejection fraction, by estimation, is 25 to 30%. The left ventricle has severely decreased function. The left ventricle demonstrates global hypokinesis. The left ventricular internal cavity size was normal in size. There is  severe concentric left ventricular hypertrophy. Abnormal (paradoxical) septal motion, consistent with left bundle branch block. Left ventricular diastolic parameters are consistent with Grade II diastolic dysfunction (pseudonormalization). Right Ventricle: The right ventricular size is normal. Right ventricular systolic function is normal. There is moderately elevated pulmonary artery systolic pressure. The tricuspid regurgitant velocity is 3.27 m/s, and with an assumed right atrial pressure of 8 mmHg, the estimated right ventricular systolic pressure is 24.5 mmHg. Left Atrium: Left atrial size was normal in size. Right Atrium: Right atrial size was moderately dilated. Pericardium: A small pericardial effusion is present. The pericardial effusion is circumferential. Mitral Valve: No evidence of mitral valve regurgitation. Tricuspid Valve: Tricuspid valve regurgitation is moderate. Aortic Valve: The aortic valve is tricuspid. Aortic valve regurgitation is not visualized. Aortic valve sclerosis is present, with no evidence of aortic valve stenosis. Aortic valve mean gradient measures 5.0 mmHg. Aortic valve peak gradient measures 9.1  mmHg. Aortic valve area, by VTI measures 2.44 cm. Pulmonic Valve: Pulmonic valve regurgitation is not visualized. Aorta: The aortic root and ascending aorta are structurally normal, with no evidence of dilitation. Venous: The inferior vena cava is dilated in size with greater than 50% respiratory variability, suggesting right atrial pressure of 8 mmHg. IAS/Shunts: No atrial level shunt detected by color flow Doppler.  LEFT VENTRICLE PLAX 2D LVIDd:         4.10 cm   Diastology LVIDs:         3.60 cm   LV e' medial:    4.35 cm/s LV PW:         2.00 cm   LV E/e' medial:  17.8 LV IVS:        1.90 cm   LV e' lateral:   6.42 cm/s LVOT diam:     2.00 cm   LV E/e' lateral: 12.1 LV SV:         63 LV SV Index:   36 LVOT Area:     3.14 cm  RIGHT VENTRICLE             IVC RV Basal diam:  3.30 cm      IVC diam: 2.00 cm RV S prime:     11.00 cm/s TAPSE (M-mode): 2.2 cm LEFT ATRIUM  Index        RIGHT ATRIUM           Index LA diam:        3.50 cm 1.98 cm/m   RA Area:     23.90 cm LA Vol (A2C):   31.9 ml 18.01 ml/m  RA Volume:   80.10 ml  45.23 ml/m LA Vol (A4C):   39.6 ml 22.36 ml/m LA Biplane Vol: 37.6 ml 21.23 ml/m  AORTIC VALVE AV Area (Vmax):    2.56 cm AV Area (Vmean):   2.45 cm AV Area (VTI):     2.44 cm AV Vmax:           151.00 cm/s AV Vmean:          109.000 cm/s AV VTI:            0.260 m AV Peak Grad:      9.1 mmHg AV Mean Grad:      5.0 mmHg LVOT Vmax:         123.00 cm/s LVOT Vmean:        85.100 cm/s LVOT VTI:          0.202 m LVOT/AV VTI ratio: 0.78  AORTA Ao Root diam: 3.00 cm Ao Asc diam:  2.90 cm MITRAL VALVE               TRICUSPID VALVE MV Area (PHT): 3.65 cm    TR Peak grad:   42.8 mmHg MV Decel Time: 208 msec    TR Vmax:        327.00 cm/s MV E velocity: 77.60 cm/s MV A velocity: 58.70 cm/s  SHUNTS MV E/A ratio:  1.32        Systemic VTI:  0.20 m                            Systemic Diam: 2.00 cm Phineas Inches Electronically signed by Phineas Inches Signature Date/Time: 05/24/2022/3:15:15 PM    Final    CT CHEST WO CONTRAST  Result Date: 05/24/2022 CLINICAL DATA:  Pneumonia EXAM: CT CHEST WITHOUT CONTRAST TECHNIQUE: Multidetector CT imaging of the chest was performed following the standard protocol without IV contrast. RADIATION DOSE REDUCTION: This exam was performed according to the departmental dose-optimization program which includes automated exposure control, adjustment of the mA and/or kV according to patient size and/or use of iterative reconstruction technique. COMPARISON:  Chest CT dated May 13, 2022 FINDINGS: Cardiovascular: Cardiomegaly. Unchanged small to moderate pericardial effusion. No coronary artery calcifications. Unchanged dilation of the main pulmonary artery Mediastinum/Nodes: Esophagus and thyroid are unremarkable. No pathologically enlarged lymph  nodes seen in the chest Lungs/Pleura: Central airways are patent. 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 Increased right middle lobe consolidation and bilateral lower lobe ground-glass opacities. New no evidence of pneumothorax. Small right-greater-than-left pleural effusions. Upper Abdomen: No acute abnormality. Musculoskeletal: New mildly displaced fracture of the lateral right sixth rib. No chest wall mass or suspicious bone lesions identified. Diffuse soft tissue anasarca. IMPRESSION: 1. New right middle lobe consolidation which is concerning for pneumonia. 2. Bilateral ground-glass opacities are seen in the lower lobes, which may be due to pulmonary edema or multifocal infection. 3. New small right-greater-than-left pleural effusions. 4. New mildly displaced fracture of the lateral right 6th rib. 5. Cardiomegaly and unchanged small to moderate pericardial effusion. Electronically Signed   By: Yetta Glassman M.D.   On: 05/24/2022  14:03   DG Chest Port 1 View  Result Date: 05/21/2022 CLINICAL DATA:  Shortness of breath. EXAM: PORTABLE CHEST 1 VIEW COMPARISON:  05/20/2022 FINDINGS: The cardio pericardial silhouette is enlarged. Persistent collapse/consolidation noted right lower lung with worsening atelectasis or infiltrate at the left base. Vascular congestion noted without overt airspace pulmonary edema. Bones are diffusely demineralized. Telemetry leads overlie the chest. IMPRESSION: Persistent collapse/consolidation right lower lung with worsening atelectasis or infiltrate at the left base. Electronically Signed   By: Misty Stanley M.D.   On: 05/21/2022 06:42   DG Chest Port 1 View  Result Date: 05/20/2022 CLINICAL DATA:  Fever and vomiting. EXAM: PORTABLE CHEST 1 VIEW COMPARISON:  May 12, 2022 FINDINGS: The cardiac silhouette is markedly enlarged and unchanged in size. Marked severity infiltrate is seen within the right lung base. There is no evidence of a  pleural effusion or pneumothorax. The visualized skeletal structures are unremarkable. IMPRESSION: Stable cardiomegaly with marked severity right basilar infiltrate. Electronically Signed   By: Virgina Norfolk M.D.   On: 05/20/2022 04:14   CT Chest Wo Contrast  Result Date: 05/13/2022 CLINICAL DATA:  Chest pain, nonspecific right lower chest pain persistent, productive cough - evaluation for PNA not visible on xray (several taken this week) vs rib fracture EXAM: CT CHEST WITHOUT CONTRAST TECHNIQUE: Multidetector CT imaging of the chest was performed following the standard protocol without IV contrast. RADIATION DOSE REDUCTION: This exam was performed according to the departmental dose-optimization program which includes automated exposure control, adjustment of the mA and/or kV according to patient size and/or use of iterative reconstruction technique. COMPARISON:  May 12, 2022; August 09, 2021 FINDINGS: Cardiovascular: Cardiomegaly. Small pericardial effusion, similar in comparison to prior. Main pulmonary artery appears mildly dilated in comparison to the ascending thoracic aorta which could reflect underlying pulmonary arterial hypertension. Unremarkable course and caliber of the thoracic aorta. Mediastinum/Nodes: No axillary or mediastinal adenopathy. Visualized thyroid is unremarkable. Lungs/Pleura: No pleural effusion or pneumothorax. LEFT retrocardiac ground-glass opacities, similar comparison to prior and favored to reflect atelectasis. Scattered bandlike opacities most consistent with atelectasis. Improvement in RIGHT middle lobe aeration in comparison to prior CT with mild residual streaky linear opacities most consistent with scar/atelectasis. Upper Abdomen: Esophagus is mildly patulous.  No acute abnormality. Musculoskeletal: Gynecomastia. Anasarca. No acute displaced rib fracture. Remote LEFT-sided rib fractures. IMPRESSION: 1. Improvement in RIGHT middle lobe aeration in comparison to prior  CT with mild residual streaky linear opacities most consistent with scar/atelectasis. 2. Cardiomegaly with small pericardial effusion. 3. Main pulmonary artery appears mildly dilated in comparison to the ascending thoracic aorta which could reflect underlying pulmonary arterial hypertension. 4. No acute displaced rib fracture. Electronically Signed   By: Valentino Saxon M.D.   On: 05/13/2022 17:14   DG Chest 2 View  Result Date: 05/12/2022 CLINICAL DATA:  Right-sided chest pain worse with coughing EXAM: CHEST - 2 VIEW COMPARISON:  05/12/2022 at 12:53 a.m. FINDINGS: Stable marked cardiomegaly. Pulmonary vascular congestion. Bibasilar atelectasis. No pleural effusion. No pneumothorax. No displaced rib fractures. IMPRESSION: Stable cardiomegaly and pulmonary vascular congestion. Electronically Signed   By: Placido Sou M.D.   On: 05/12/2022 20:21   DG Chest 2 View  Result Date: 05/12/2022 CLINICAL DATA:  Chest pain, cough EXAM: CHEST - 2 VIEW COMPARISON:  05/10/2022 FINDINGS: Lungs are clear. No pneumothorax or pleural effusion. Cardiac size is moderately enlarged, unchanged. Pulmonary vascular redistribution of the lung apices again noted in keeping with early cardiogenic failure or mild volume overload. No  superimposed overt pulmonary edema. No acute bone abnormality. IMPRESSION: 1. Stable cardiomegaly. 2. Pulmonary vascular redistribution in keeping with early cardiogenic failure or mild volume overload. Electronically Signed   By: Fidela Salisbury M.D.   On: 05/12/2022 01:29   DG Chest 2 View  Result Date: 05/10/2022 CLINICAL DATA:  Chest pain. EXAM: CHEST - 2 VIEW COMPARISON:  May 09, 2022. FINDINGS: Stable cardiomegaly. Both lungs are clear. The visualized skeletal structures are unremarkable. IMPRESSION: No active cardiopulmonary disease. Electronically Signed   By: Marijo Conception M.D.   On: 05/10/2022 16:24   DG Chest 2 View  Result Date: 05/09/2022 CLINICAL DATA:  Chest pain EXAM:  CHEST - 2 VIEW COMPARISON:  Chest x-ray dated May 16, 2022 FINDINGS: Unchanged cardiomegaly. Mild bilateral interstitial opacities, similar to prior exam. No large pleural effusion or pneumothorax. IMPRESSION: 1. Unchanged cardiomegaly 2. Mild bilateral interstitial opacities, likely due to mild pulmonary edema. Electronically Signed   By: Yetta Glassman M.D.   On: 05/09/2022 21:35   DG Chest 2 View  Result Date: 05/08/2022 CLINICAL DATA:  Chest pain shortness of breath x1 week. EXAM: CHEST - 2 VIEW COMPARISON:  Multiple priors including most recent chest radiograph May 03, 2022. FINDINGS: Similar marked cardiac enlargement central vascular congestion. Probable mild interstitial edema. Similar bibasilar atelectasis. Healing left-sided rib fractures. IMPRESSION: Cardiomegaly with central vascular congestion and probable mild interstitial edema. Electronically Signed   By: Dahlia Bailiff M.D.   On: 05/08/2022 18:35   DG Chest Port 1 View  Result Date: 05/03/2022 CLINICAL DATA:  Cough and shortness of breath EXAM: PORTABLE CHEST 1 VIEW COMPARISON:  04/25/2022 FINDINGS: Cardiac shadow is enlarged but stable. Lungs are well aerated bilaterally. No focal infiltrate is seen. No bony abnormality is noted. IMPRESSION: Stable cardiomegaly. Electronically Signed   By: Inez Catalina M.D.   On: 05/03/2022 03:31    Micro Results    Recent Results (from the past 240 hour(s))  Blood culture (routine x 2)     Status: Abnormal   Collection Time: 05/20/22  5:29 AM   Specimen: BLOOD  Result Value Ref Range Status   Specimen Description   Final    BLOOD SITE NOT SPECIFIED Performed at [facility] [address], [place], [place] [address]    Special Requests   Final    BOTTLES DRAWN AEROBIC AND ANAEROBIC Blood Culture adequate volume Performed at [facility] [address], [place], [place] [address]    Culture  Setup Time   Final    GRAM POSITIVE COCCI IN  PAIRS ANAEROBIC BOTTLE ONLY CRITICAL RESULT CALLED TO, READ BACK BY AND VERIFIED WITH: [facility] JEREMY [id number] FCP Performed at [facility], [place] [address] [place], [place] [address]    Culture STREPTOCOCCUS PNEUMONIAE (A)  Final   Report Status 05/23/2022 FINAL  Final   Organism ID, Bacteria STREPTOCOCCUS PNEUMONIAE  Final      Susceptibility   Streptococcus pneumoniae - MIC*    ERYTHROMYCIN <=0.12 SENSITIVE Sensitive     LEVOFLOXACIN 0.5 SENSITIVE Sensitive     VANCOMYCIN 0.25 SENSITIVE Sensitive     PENICILLIN (meningitis) <=0.06 SENSITIVE Sensitive     PENO - penicillin <=0.06      PENICILLIN (non-meningitis) <=0.06 SENSITIVE Sensitive     PENICILLIN (oral) <=0.06 SENSITIVE Sensitive     CEFTRIAXONE (non-meningitis) <=0.12 SENSITIVE Sensitive     CEFTRIAXONE (meningitis) <=0.12 SENSITIVE Sensitive     * STREPTOCOCCUS PNEUMONIAE  Blood Culture  ID Panel (Reflexed)     Status: Abnormal   Collection Time: 05/20/22  5:29 AM  Result Value Ref Range Status   Enterococcus faecalis NOT DETECTED NOT DETECTED Final   Enterococcus Faecium NOT DETECTED NOT DETECTED Final   Listeria monocytogenes NOT DETECTED NOT DETECTED Final   Staphylococcus species NOT DETECTED NOT DETECTED Final   Staphylococcus aureus (BCID) NOT DETECTED NOT DETECTED Final   Staphylococcus epidermidis NOT DETECTED NOT DETECTED Final   Staphylococcus lugdunensis NOT DETECTED NOT DETECTED Final   Streptococcus species DETECTED (A) NOT DETECTED Final    Comment: CRITICAL RESULT CALLED TO, READ BACK BY AND VERIFIED WITH: PHARMD JEREMY F [id number] FCP    Streptococcus agalactiae NOT DETECTED NOT DETECTED Final   Streptococcus pneumoniae DETECTED (A) NOT DETECTED Final    Comment: CRITICAL RESULT CALLED TO, READ BACK BY AND VERIFIED WITH: PHARMD JEREMY F [id number] FCP    Streptococcus pyogenes NOT DETECTED NOT DETECTED Final   A.calcoaceticus-baumannii NOT DETECTED NOT DETECTED Final   Bacteroides  fragilis NOT DETECTED NOT DETECTED Final   Enterobacterales NOT DETECTED NOT DETECTED Final   Enterobacter cloacae complex NOT DETECTED NOT DETECTED Final   Escherichia coli NOT DETECTED NOT DETECTED Final   Klebsiella aerogenes NOT DETECTED NOT DETECTED Final   Klebsiella oxytoca NOT DETECTED NOT DETECTED Final   Klebsiella pneumoniae NOT DETECTED NOT DETECTED Final   Proteus species NOT DETECTED NOT DETECTED Final   Salmonella species NOT DETECTED NOT DETECTED Final   Serratia marcescens NOT DETECTED NOT DETECTED Final   Haemophilus influenzae NOT DETECTED NOT DETECTED Final   Neisseria meningitidis NOT DETECTED NOT DETECTED Final   Pseudomonas aeruginosa NOT DETECTED NOT DETECTED Final   Stenotrophomonas maltophilia NOT DETECTED NOT DETECTED Final   Candida albicans NOT DETECTED NOT DETECTED Final   Candida auris NOT DETECTED NOT DETECTED Final   Candida glabrata NOT DETECTED NOT DETECTED Final   Candida krusei NOT DETECTED NOT DETECTED Final   Candida parapsilosis NOT DETECTED NOT DETECTED Final   Candida tropicalis NOT DETECTED NOT DETECTED Final   Cryptococcus neoformans/gattii NOT DETECTED NOT DETECTED Final    Comment: Performed at [facility], [address], [place], [place] [address]  Blood culture (routine x 2)     Status: None   Collection Time: 05/20/22 11:38 AM   Specimen: BLOOD LEFT FOREARM  Result Value Ref Range Status   Specimen Description   Final    BLOOD LEFT FOREARM Performed at [facility], [place] [address] [place], [place] [address]    Special Requests   Final    BOTTLES DRAWN AEROBIC AND ANAEROBIC Blood Culture adequate volume Performed at [facility] [address] [place], [place] [address]    Culture   Final    NO GROWTH 6 DAYS Performed at [facility], [place] [address], [place], [place] [address]    Report Status 05/26/2022 FINAL  Final  Culture, blood (Routine X 2) w Reflex to ID Panel     Status: None    Collection Time: 05/23/22  9:45 AM   Specimen: BLOOD LEFT HAND  Result Value Ref Range Status   Specimen Description BLOOD LEFT HAND  Final   Special Requests   Final    BOTTLES DRAWN AEROBIC AND ANAEROBIC Blood Culture adequate volume   Culture   Final    NO GROWTH 5 DAYS Performed at [facility], [place] [address], [place], [place] [address]  Report Status 05/28/2022 FINAL  Final  Culture, blood (Routine X 2) w Reflex to ID Panel     Status: Abnormal   Collection Time: 05/23/22  9:50 AM   Specimen: BLOOD LEFT HAND  Result Value Ref Range Status   Specimen Description BLOOD LEFT HAND  Final   Special Requests   Final    AEROBIC BOTTLE ONLY Blood Culture results may not be optimal due to an inadequate volume of blood received in culture bottles   Culture  Setup Time   Final    AEROBIC BOTTLE ONLY GRAM POSITIVE RODS CRITICAL RESULT CALLED TO, READ BACK BY AND VERIFIED WITH:  C/ PHARMD H. VON DOHLEN 05/25/22 1319 A. LAFRANCE    Culture (A)  Final    CORYNEBACTERIUM MINUTISSIMUM Standardized susceptibility testing for this organism is not available. Performed at [facility], [place] [address], [place], [place] [address]    Report Status 05/27/2022 FINAL  Final    Today   Subjective    Ata Pecha today has no headache,no chest abdominal pain,no new weakness tingling or numbness, feels much better wants to go home today.    Objective   Blood pressure 128/75, pulse 70, temperature 97.6 F (36.4 C), temperature source Oral, resp. rate 18, height 5\' 7"  (1.702 m), weight 62.1 kg, SpO2 100 %.  No intake or output data in the 24 hours ending 05/28/22 1135  Exam  Awake Alert, No new F.N deficits,    [place].AT,PERRAL Supple Neck,   Symmetrical Chest wall movement, Good air movement bilaterally, CTAB RRR,No Gallops,   +ve B.Sounds, Abd Soft, Non tender,  No Cyanosis, Clubbing or edema    Data Review   Recent Labs  Lab 05/23/22 0407 05/25/22 0820 05/26/22 0318   WBC 7.7 8.3 7.3  HGB 8.4* 8.4* 8.0*  HCT 27.8* 27.8* 26.8*  PLT 315 336 389  MCV 93.6 93.9 94.7  MCH 28.3 28.4 28.3  MCHC 30.2 30.2 29.9*  RDW 14.2 14.4 14.5  LYMPHSABS 0.9 1.0 1.0  MONOABS 0.9 1.3* 1.1*  EOSABS 0.2 0.3 0.3  BASOSABS 0.0 0.1 0.1    Recent Labs  Lab 05/23/22 0407 05/25/22 0820 05/26/22 0318  NA 136 137 135  K 4.2 4.6 3.6  CL 96* 96* 93*  CO2 29 27 31   ANIONGAP 11 14 11   GLUCOSE 97 113* 185*  BUN 42* 38* 24*  CREATININE 7.44* 7.65* 5.58*  CRP 7.3* 6.2*  --   BNP >4,500.0* 3,829.2*  --   CALCIUM 8.2* 8.4* 8.2*     Total Time in preparing paper work, data evaluation and todays exam - 35 minutes  Signature  -    Lala Lund M.D on 05/28/2022 at 11:35 AM   -  To page go to [email or web address]

## 2022-05-25 NOTE — Progress Notes (Addendum)
[facility] Progress Note   Dialysis Orders:  TTS - [facility] 4hrs, BFR 400, DFR 800,  EDW 61.5kg, 2K/ 2.5Ca Heparin 2000 units with HD No VDRA/ESA ordered  Assessment/Plan: Strep pneumoniae bacteremia - IV antibiotics per primary. CAP -  per primary team  ESRD - on HD TTS. Continue on schedule. Next HD today for 1st shift.  Hypertension/volume  - BP acceptable. Continue volume removal with HD.  Anemia of CKD - Hgb 8.4. Received Aranesp 100 on 1/4. Tsat 18% Hold IV Fe d/t active infection.  Secondary Hyperparathyroidism -  Corr Ca/Phos ok.  Continue binders.  Nutrition - Renal diet with fluid restriction, will add protein supplements Isolation - Covid + 12/15 -off airborne precaution. Continue droplet precautions for RSV. Appreciate ID input.   Subjective: Seen in room. Tolerated net UF 3.7L on Thur. Breathing ok but c/o right side pain worse with coughing or sneezing but not deep inspiration.    Objective Vitals:   05/24/22 0811 05/24/22 0915 05/24/22 1204 05/24/22 1623  BP: (!) 151/81  (!) 141/76 133/62  Pulse: 82 82 75 73  Resp: 17 14 17 18   Temp: 98.7 F (37.1 C)  98.9 F (37.2 C) 98 F (36.7 C)  TempSrc: Oral  Oral Oral  SpO2:  96%    Weight:      Height:         Additional Objective Labs: Basic Metabolic Panel: Recent Labs  Lab 05/20/22 0505 05/20/22 0516 05/21/22 0457 05/23/22 0407  NA 137 140 136 136  K 4.3 4.4 4.7 4.2  CL 99 102 97* 96*  CO2 26  --  24 29  GLUCOSE 158* 159* 107* 97  BUN 47* 45* 59* 42*  CREATININE 8.47* 8.90* 9.42* 7.44*  CALCIUM 7.6*  --  8.0* 8.2*  PHOS  --   --   --  4.2   CBC: Recent Labs  Lab 05/20/22 0505 05/20/22 0516 05/21/22 0457 05/23/22 0407  WBC 10.7*  --  9.2 7.7  NEUTROABS 9.4*  --   --  5.5  HGB 9.5* 11.2* 8.1* 8.4*  HCT 31.6* 33.0* 25.2* 27.8*  MCV 94.3  --  89.4 93.6  PLT 389  --  266 315   Blood Culture    Component Value Date/Time   SDES BLOOD LEFT HAND 05/23/2022 0950   SPECREQUEST   05/23/2022 0950    AEROBIC BOTTLE ONLY Blood Culture results may not be optimal due to an inadequate volume of blood received in culture bottles   CULT  05/23/2022 0950    NO GROWTH 1 DAY Performed at [facility], [place] [address], [place], [place] [address]    REPTSTATUS PENDING 05/23/2022 6073     Physical Exam General: Alert, nad, on nasal oxygen  Heart: RRR Lungs: Clear bilaterally  Abdomen: soft non -tender Extremities: No LE edema  Dialysis Access: Rt FAL loop AVG + thrill  Medications:  ampicillin-sulbactam (UNASYN) IV 3 g (05/24/22 1804)    calcium acetate  2,001 mg Oral TID   carvedilol  3.125 mg Oral BID WC   Chlorhexidine Gluconate Cloth  6 each Topical Q0600   [START ON 05/30/2022] darbepoetin (ARANESP) injection - DIALYSIS  100 mcg Subcutaneous Q Thu-1800   famotidine  20 mg Oral Daily   feeding supplement (NEPRO CARB STEADY)  237 mL Oral TID WC   heparin  5,000 Units Subcutaneous Q8H   insulin aspart  0-6 Units Subcutaneous TID WC   isosorbide-hydrALAZINE  1 tablet Oral  TID   sevelamer carbonate  1,600 mg Oral TID WC     General adult male in bed in no acute distress HEENT normocephalic atraumatic extraocular movements intact sclera anicteric Neck supple trachea midline Lungs clear to auscultation bilaterally normal work of breathing at rest on 1 liter oxygen  Heart S1S2 no rub Abdomen soft nontender nondistended Extremities no edema  Psych no anxiety or agitation  Access Lt FAL  AV graft with bruit     Dwana Melena, MD 05/25/2022 7:32 AM

## 2022-05-25 NOTE — Progress Notes (Signed)
[name] expresses to transport and to primary RN that he does not want to attend dialysis today. He does not feel like going. Patient was educated on the risks of refusing dialysis and the risk of losing his chair to go to dialysis for the day. Patient states "maybe next time."

## 2022-05-25 NOTE — Progress Notes (Signed)
PHARMACY - PHYSICIAN COMMUNICATION CRITICAL VALUE ALERT - BLOOD CULTURE IDENTIFICATION (BCID)  [name] is an 52 y.o. male who presented to [facility] on 05/20/2022  Assessment:  42 yom presenting with CAP and strep pneumo bacteremia. Patient discharging home on Augmentin x 7 days due to his request. Now repeat BCx with 1/3 bottles positive for gram positive rods.   Name of physician (or Provider) Contacted: Candiss Norse, P  Current antibiotics: discharging home on Augmentin x 7 days per [facility]  Changes to prescribed antibiotics recommended:  No changes  Results for orders placed or performed during the hospital encounter of 05/20/22  Blood Culture ID Panel (Reflexed) (Collected: 05/20/2022  5:29 AM)  Result Value Ref Range   Enterococcus faecalis NOT DETECTED NOT DETECTED   Enterococcus Faecium NOT DETECTED NOT DETECTED   Listeria monocytogenes NOT DETECTED NOT DETECTED   Staphylococcus species NOT DETECTED NOT DETECTED   Staphylococcus aureus (BCID) NOT DETECTED NOT DETECTED   Staphylococcus epidermidis NOT DETECTED NOT DETECTED   Staphylococcus lugdunensis NOT DETECTED NOT DETECTED   Streptococcus species DETECTED (A) NOT DETECTED   Streptococcus agalactiae NOT DETECTED NOT DETECTED   Streptococcus pneumoniae DETECTED (A) NOT DETECTED   Streptococcus pyogenes NOT DETECTED NOT DETECTED   A.calcoaceticus-baumannii NOT DETECTED NOT DETECTED   Bacteroides fragilis NOT DETECTED NOT DETECTED   Enterobacterales NOT DETECTED NOT DETECTED   Enterobacter cloacae complex NOT DETECTED NOT DETECTED   Escherichia coli NOT DETECTED NOT DETECTED   Klebsiella aerogenes NOT DETECTED NOT DETECTED   Klebsiella oxytoca NOT DETECTED NOT DETECTED   Klebsiella pneumoniae NOT DETECTED NOT DETECTED   Proteus species NOT DETECTED NOT DETECTED   Salmonella species NOT DETECTED NOT DETECTED   Serratia marcescens NOT DETECTED NOT DETECTED   Haemophilus influenzae NOT DETECTED NOT DETECTED   Neisseria  meningitidis NOT DETECTED NOT DETECTED   Pseudomonas aeruginosa NOT DETECTED NOT DETECTED   Stenotrophomonas maltophilia NOT DETECTED NOT DETECTED   Candida albicans NOT DETECTED NOT DETECTED   Candida auris NOT DETECTED NOT DETECTED   Candida glabrata NOT DETECTED NOT DETECTED   Candida krusei NOT DETECTED NOT DETECTED   Candida parapsilosis NOT DETECTED NOT DETECTED   Candida tropicalis NOT DETECTED NOT DETECTED   Cryptococcus neoformans/gattii NOT DETECTED NOT DETECTED    [name] 05/25/2022  1:39 PM

## 2022-05-26 DIAGNOSIS — I5042 Chronic combined systolic (congestive) and diastolic (congestive) heart failure: Secondary | ICD-10-CM | POA: Diagnosis not present

## 2022-05-26 LAB — BASIC METABOLIC PANEL
Anion gap: 11 (ref 5–15)
BUN: 24 mg/dL — ABNORMAL HIGH (ref 6–20)
CO2: 31 mmol/L (ref 22–32)
Calcium: 8.2 mg/dL — ABNORMAL LOW (ref 8.9–10.3)
Chloride: 93 mmol/L — ABNORMAL LOW (ref 98–111)
Creatinine, Ser: 5.58 mg/dL — ABNORMAL HIGH (ref 0.61–1.24)
GFR, Estimated: 12 mL/min — ABNORMAL LOW (ref >60)
Glucose, Bld: 185 mg/dL — ABNORMAL HIGH (ref 70–99)
Potassium: 3.6 mmol/L (ref 3.5–5.1)
Sodium: 135 mmol/L (ref 135–145)

## 2022-05-26 LAB — CBC WITH DIFFERENTIAL/PLATELET
Abs Immature Granulocytes: 0.29 10*3/uL — ABNORMAL HIGH (ref 0.00–0.07)
Basophils Absolute: 0.1 10*3/uL (ref 0.0–0.1)
Basophils Relative: 1 %
Eosinophils Absolute: 0.3 10*3/uL (ref 0.0–0.5)
Eosinophils Relative: 5 %
HCT: 26.8 % — ABNORMAL LOW (ref 39.0–52.0)
Hemoglobin: 8 g/dL — ABNORMAL LOW (ref 13.0–17.0)
Immature Granulocytes: 4 %
Lymphocytes Relative: 13 %
Lymphs Abs: 1 10*3/uL (ref 0.7–4.0)
MCH: 28.3 pg (ref 26.0–34.0)
MCHC: 29.9 g/dL — ABNORMAL LOW (ref 30.0–36.0)
MCV: 94.7 fL (ref 80.0–100.0)
Monocytes Absolute: 1.1 10*3/uL — ABNORMAL HIGH (ref 0.1–1.0)
Monocytes Relative: 15 %
Neutro Abs: 4.6 10*3/uL (ref 1.7–7.7)
Neutrophils Relative %: 62 %
Platelets: 389 10*3/uL (ref 150–400)
RBC: 2.83 MIL/uL — ABNORMAL LOW (ref 4.22–5.81)
RDW: 14.5 % (ref 11.5–15.5)
WBC: 7.3 10*3/uL (ref 4.0–10.5)
nRBC: 0 % (ref 0.0–0.2)

## 2022-05-26 LAB — GLUCOSE, CAPILLARY
Glucose-Capillary: 176 mg/dL — ABNORMAL HIGH (ref 70–99)
Glucose-Capillary: 158 mg/dL — ABNORMAL HIGH (ref 70–99)
Glucose-Capillary: 168 mg/dL — ABNORMAL HIGH (ref 70–99)

## 2022-05-26 LAB — CULTURE, BLOOD (ROUTINE X 2)
Culture: NO GROWTH
Special Requests: ADEQUATE

## 2022-05-26 MED ORDER — AMLODIPINE BESYLATE 10 MG PO TABS
10.0000 mg | ORAL_TABLET | Freq: Every day | ORAL | Status: DC
  Administered 2022-05-26 – 2022-05-27 (×2): 10 mg via ORAL
  Filled 2022-05-26 (×2): qty 1

## 2022-05-26 MED ORDER — AMLODIPINE BESYLATE 10 MG PO TABS: 10.0000 mg | ORAL_TABLET | Freq: Every day | ORAL | 0 refills | Status: DC

## 2022-05-26 NOTE — TOC Progression Note (Signed)
Transition of Care [facility]) - Progression Note    Patient Details  Name: [name] MRN: [id number] Date of Birth: 21-Jul-1970  Transition of Care [facility]) CM/SW Contact  Carles Collet, RN Phone Number: 05/26/2022, 12:26 PM  Clinical Narrative:     Discussed with patient that he does not have an address to DC to, that he is homeless. Suggested to him that he should try to contact a friend or aquaintance that he could stay with after DC, because he is currently need home oxygen and this cannot be set up without him having an address on file with the DME company to deliver, set up, service, and bill to.  Discussed with attending.   Patient will need to wean off oxygen to be able to discharge to a homeless shelter.        Expected Discharge Plan and Services         Expected Discharge Date: 05/25/22                                     Social Determinants of Health (SDOH) Interventions [place]: Food Insecurity Present (05/08/2022)  Housing: Medium Risk (05/08/2022)  Transportation Needs: Unmet Transportation Needs (05/08/2022)  Utilities: Not At Risk (05/08/2022)  Depression (PHQ2-9): Low Risk  (10/13/2019)  Financial Resource Strain: High Risk (05/08/2022)  Tobacco Use: Low Risk  (05/20/2022)    Readmission Risk Interventions    11/07/2021    4:21 PM 02/19/2021    3:52 PM  Readmission Risk Prevention Plan  Transportation Screening Complete Complete  Medication Review Press photographer) Complete Complete  PCP or Specialist appointment within 3-5 days of discharge Complete Complete  HRI or [place] Complete Complete  SW Recovery Care/Counseling Consult Complete Complete  Palliative Care Screening Not Applicable Not [place] Not Applicable Not Applicable

## 2022-05-26 NOTE — Progress Notes (Signed)
Pt refused Heparin sub q injection.

## 2022-05-26 NOTE — Progress Notes (Signed)
[facility]                                                                                                                                                                         Patient Demographics  [name], is a 52 y.o. male  XBD:[id number]  [id number]  DOB - [birth year]  Admit date - 05/20/2022  Admitting Physician Corliss Parish, MD  Outpatient Primary MD for the patient is Criss Rosales, Clinic  LOS - 6   Chief Complaint  Patient presents with   Emesis        Assessment & Plan    Patient seen briefly today due for discharge soon per Discharge done yesterday by me, no further issues, Norvasc added for better BP control, patient feels fine. He requested home oxygen, will do home oxygen eval and then discharge.    Medications  Scheduled Meds:  amLODipine  10 mg Oral Daily   amoxicillin-clavulanate  2 tablet Oral Q12H   calcium acetate  2,001 mg Oral TID   carvedilol  3.125 mg Oral BID WC   Chlorhexidine Gluconate Cloth  6 each Topical Q0600   [START ON 05/30/2022] darbepoetin (ARANESP) injection - DIALYSIS  100 mcg Subcutaneous Q Thu-1800   famotidine  20 mg Oral Daily   feeding supplement (NEPRO CARB STEADY)  237 mL Oral TID WC   heparin  5,000 Units Subcutaneous Q8H   insulin aspart  0-6 Units Subcutaneous TID WC   isosorbide-hydrALAZINE  1 tablet Oral TID   sevelamer carbonate  1,600 mg Oral TID WC   Continuous Infusions: PRN Meds:.acetaminophen **OR** [DISCONTINUED] acetaminophen, guaiFENesin-dextromethorphan, HYDROcodone-acetaminophen, ondansetron **OR** ondansetron (ZOFRAN) IV    Time Spent in minutes   10 minutes   Lala Lund M.D on 05/26/2022 at 8:57 AM  Between 7am to 7pm - Pager - [phone]  After 7pm go to [email or web address] - password [email or web address]  And look for the night coverage person covering for me after hours  [facility] Office  [phone]     Subjective:   [name] today has, No headache, No chest pain, No abdominal pain - No Nausea, No new weakness tingling or numbness, No Cough - SOB.    Objective:   Vitals:   05/25/22 1646 05/25/22 1654 05/25/22 1835 05/26/22 0743  BP: (!) 154/75 (!) 182/88 (!) 152/88 (!) 174/94  Pulse: 78 80 78 82  Resp: 17 17 18    Temp:  98.9 F (37.2 C) 98.2 F (36.8 C) (!) 97.5 F (36.4 C)  TempSrc:   Oral Axillary  SpO2:  100% 100% 100%  Weight:  62.1 kg    Height:        Wt Readings from Last 3 Encounters:  05/25/22 62.1 kg  05/17/22 81.6 kg  05/13/22 62.6 kg     Intake/Output Summary (Last 24 hours) at 05/26/2022 0857 Last data filed at 05/25/2022 1654 Gross per 24 hour  Intake --  Output 4000 ml  Net -4000 ml    Exam  Awake Alert, No new F.N deficits, Normal affect [employer].AT,PERRAL Supple Neck, No JVD,   Symmetrical Chest wall movement, Good air movement bilaterally, CTAB RRR,No Gallops, Rubs or new Murmurs,  +ve B.Sounds, Abd Soft, No tenderness,   No Cyanosis, Clubbing or edema   Data Review

## 2022-05-26 NOTE — Progress Notes (Addendum)
Instruct pt on using IS and Flutter acknowledged understanding with teach back/able to demonstrate how to use and understand the goal is to come off oxygen. SRP, RN

## 2022-05-26 NOTE — Plan of Care (Signed)
  Problem: Health Behavior/Discharge Planning: Goal: Ability to manage health-related needs will improve Outcome: Progressing   Problem: Clinical Measurements: Goal: Ability to maintain clinical measurements within normal limits will improve Outcome: Progressing Goal: Will remain free from infection Outcome: Progressing Goal: Diagnostic test results will improve Outcome: Progressing   

## 2022-05-26 NOTE — Plan of Care (Signed)

## 2022-05-26 NOTE — Progress Notes (Signed)
[facility] Progress Note   Dialysis Orders:  TTS - [facility] 4hrs, BFR 400, DFR 800,  EDW 61.5kg, 2K/ 2.5Ca Heparin 2000 units with HD No VDRA/ESA ordered  Assessment/Plan: Strep pneumoniae bacteremia - IV antibiotics per primary. CAP -  per primary team  ESRD - on HD TTS. Continue on schedule.  Tolerated HD 1/6 with 4L net UF with no cramping No LE edema.  From renal standpoint stable for outpt HD.  Hypertension/volume  - BP acceptable. Continue volume removal with HD.  Anemia of CKD - Hgb 8. Received Aranesp 100 on 1/4. Tsat 18% Hold IV Fe d/t active infection. Transfuse as needed. Secondary Hyperparathyroidism -  Corr Ca/Phos ok.  Continue binders.  Nutrition - Renal diet with fluid restriction, will add protein supplements Isolation - Covid + 12/15 -off airborne precaution. Continue droplet precautions for RSV. Appreciate ID input.   Subjective: Seen in room. Tolerated net UF 3.7L on Thur and then 4L Sat; initially refused HD Sat but then willing later in the day. Breathing ok but c/o right side pain worse with coughing or sneezing but not deep inspiration. States he has leg tightness also.   Objective Vitals:   05/25/22 1630 05/25/22 1646 05/25/22 1654 05/25/22 1835  BP: (!) 150/72 (!) 154/75 (!) 182/88 (!) 152/88  Pulse: 71 78 80 78  Resp: 15 17 17 18   Temp:   98.9 F (37.2 C) 98.2 F (36.8 C)  TempSrc:    Oral  SpO2:   100% 100%  Weight:   62.1 kg   Height:         Additional Objective Labs: Basic Metabolic Panel: Recent Labs  Lab 05/23/22 0407 05/25/22 0820 05/26/22 0318  NA 136 137 135  K 4.2 4.6 3.6  CL 96* 96* 93*  CO2 29 27 31   GLUCOSE 97 113* 185*  BUN 42* 38* 24*  CREATININE 7.44* 7.65* 5.58*  CALCIUM 8.2* 8.4* 8.2*  PHOS 4.2  --   --    CBC: Recent Labs  Lab 05/20/22 0505 05/20/22 0516 05/21/22 0457 05/23/22 0407 05/25/22 0820 05/26/22 0318  WBC 10.7*  --  9.2 7.7 8.3 7.3  NEUTROABS 9.4*  --   --  5.5 5.6 4.6  HGB 9.5*    < > 8.1* 8.4* 8.4* 8.0*  HCT 31.6*   < > 25.2* 27.8* 27.8* 26.8*  MCV 94.3  --  89.4 93.6 93.9 94.7  PLT 389  --  266 315 336 389   < > = values in this interval not displayed.   Blood Culture    Component Value Date/Time   SDES BLOOD LEFT HAND 05/23/2022 0950   SPECREQUEST  05/23/2022 0950    AEROBIC BOTTLE ONLY Blood Culture results may not be optimal due to an inadequate volume of blood received in culture bottles   CULT  05/23/2022 0950    NO GROWTH 1 DAY Performed at [facility], [place] [address], [place], [place] [address]    REPTSTATUS PENDING 05/23/2022 7619     Physical Exam General: Alert, nad, on nasal oxygen  Heart: RRR Lungs: Clear bilaterally  Abdomen: soft non -tender Extremities: No LE edema  Dialysis Access: Rt FAL loop AVG + thrill  Medications:    amoxicillin-clavulanate  2 tablet Oral Q12H   calcium acetate  2,001 mg Oral TID   carvedilol  3.125 mg Oral BID WC   Chlorhexidine Gluconate Cloth  6 each Topical Q0600   [START ON 05/30/2022] darbepoetin (ARANESP) injection -  DIALYSIS  100 mcg Subcutaneous Q Thu-1800   famotidine  20 mg Oral Daily   feeding supplement (NEPRO CARB STEADY)  237 mL Oral TID WC   heparin  5,000 Units Subcutaneous Q8H   insulin aspart  0-6 Units Subcutaneous TID WC   isosorbide-hydrALAZINE  1 tablet Oral TID   sevelamer carbonate  1,600 mg Oral TID WC     General adult male in bed in no acute distress HEENT NCAT Neck supple trachea midline Lungs clear to auscultation bilaterally normal work of breathing at rest on 1 liter oxygen  Heart S1S2 no rub Abdomen soft nontender nondistended Extremities no edema  Psych no anxiety or agitation  Access Lt FAL  AV graft with bruit     Dwana Melena, MD 05/26/2022 7:21 AM

## 2022-05-27 ENCOUNTER — Ambulatory Visit: Payer: Commercial Managed Care - HMO | Admitting: General Practice

## 2022-05-27 DIAGNOSIS — J189 Pneumonia, unspecified organism: Secondary | ICD-10-CM | POA: Diagnosis not present

## 2022-05-27 LAB — GLUCOSE, CAPILLARY
Glucose-Capillary: 170 mg/dL — ABNORMAL HIGH (ref 70–99)
Glucose-Capillary: 164 mg/dL — ABNORMAL HIGH (ref 70–99)
Glucose-Capillary: 106 mg/dL — ABNORMAL HIGH (ref 70–99)
Glucose-Capillary: 216 mg/dL — ABNORMAL HIGH (ref 70–99)

## 2022-05-27 LAB — CULTURE, BLOOD (ROUTINE X 2)

## 2022-05-27 MED ORDER — CARVEDILOL 6.25 MG PO TABS
6.2500 mg | ORAL_TABLET | Freq: Two times a day (BID) | ORAL | Status: DC
  Administered 2022-05-27: 6.25 mg via ORAL
  Filled 2022-05-27: qty 1

## 2022-05-27 NOTE — Progress Notes (Signed)
PROGRESS NOTE                                                                                                                                                                                                             Patient Demographics:    [name], is a 52 y.o. male, DOB - [birth year], HWT:[id number]  Outpatient Primary MD for the patient is [facility]    LOS - 7  Admit date - 05/20/2022    Chief Complaint  Patient presents with   Emesis       Brief Narrative (HPI from H&P)    52 y.o. male with medical history significant of anxiety, depression, suicidal ideations, insomnia, homelessness, type II DM, elevated alkaline phosphatase level, hypertension, hyperlipidemia, left bundle branch block, history of pneumonia, vitamin D deficiency, ESRD on hemodialysis who came into the emergency department due to nausea, vomiting, RLL chest and abdominal pain.  Having pain in the right lower chest which is pleuritic in nature worse with deep breaths and cough along with fever for the last 7 to 8 days, he claims he was diagnosed with COVID about 10 days ago, came to the hospital where he was diagnosed with pneumonia and admitted to the hospital.   Subjective:   Patient in bed, appears comfortable, denies any headache, no fever, no chest pain or pressure, no shortness of breath , no abdominal pain. No new focal weakness.   Assessment  & Plan :   Right sided chest wall pleuritic chest pain in the setting of community-acquired pneumonia with Streptococcus pneumonia bacteremia.  Diagnosis of COVID-19 infection which has resolved, currently has strep pneumonia bacteremia for which he was initially on Unasyn now transition to Augmentin to complete a total of 2-week treatment.  Clinically stable, does have some atelectasis as he refuses to get out of the bed, has some oxygen demand upon exertion due to atelectasis and pneumonia  combination.    Medically stable for discharge unfortunately he currently qualifies for home oxygen and this cannot be arranged due to his homelessness.  Will wait till his oxygen requirements improved once he is on room air can be discharged.  Have encouraged him to sit in chair use I-S and flutter valve for pulmonary toiletry.  ESRD.  On TTS schedule nephrology called.  Anemia of chronic disease.  Stable.  Essential  hypertension.  Switch Norvasc to Coreg and BiDil combination.  Coreg dose adjusted on 05/27/2022 for better control.  Chronic combined systolic and diastolic CHF EF 78% in 5885.  Currently compensated, HD for any fluid removal.  Will place on low-dose Coreg along with BiDil, avoiding ACE/ARB due to ESRD.  DM Type II.  ISS.  Lab Results  Component Value Date   HGBA1C 8.2 (H) 05/21/2022   CBG (last 3)  Recent Labs    05/26/22 1237 05/26/22 1616 05/27/22 0818  GLUCAP 158* 168* 170*         Condition - Fair  Family Communication  :  None present  Code Status :  FRull  Consults  :  Renal  PUD Prophylaxis : Pepcid   Procedures  :     CTA - pt refused  CT chest noncontrast reordered patient counseled  Echocardiogram reordered patient refused the first 1.      Disposition Plan  :    Status is: Inpatient  DVT Prophylaxis  :    heparin injection 5,000 Units Start: 05/20/22 1400   Lab Results  Component Value Date   PLT 389 05/26/2022    Diet :  Diet Order             Diet renal/carb modified with fluid restriction Diet-HS Snack? Nothing; Fluid restriction: 1200 mL Fluid; Room service appropriate? Yes; Fluid consistency: Thin  Diet effective now                    Inpatient Medications  Scheduled Meds:  amLODipine  10 mg Oral Daily   amoxicillin-clavulanate  2 tablet Oral Q12H   calcium acetate  2,001 mg Oral TID   carvedilol  6.25 mg Oral BID WC   Chlorhexidine Gluconate Cloth  6 each Topical Q0600   [START ON 05/30/2022]  darbepoetin (ARANESP) injection - DIALYSIS  100 mcg Subcutaneous Q Thu-1800   famotidine  20 mg Oral Daily   feeding supplement (NEPRO CARB STEADY)  237 mL Oral TID WC   heparin  5,000 Units Subcutaneous Q8H   insulin aspart  0-6 Units Subcutaneous TID WC   isosorbide-hydrALAZINE  1 tablet Oral TID   sevelamer carbonate  1,600 mg Oral TID WC   Continuous Infusions:   PRN Meds:.acetaminophen **OR** [DISCONTINUED] acetaminophen, guaiFENesin-dextromethorphan, HYDROcodone-acetaminophen, ondansetron **OR** ondansetron (ZOFRAN) IV    Objective:   Vitals:   05/26/22 1234 05/26/22 2210 05/27/22 0521 05/27/22 0829  BP: (!) 157/78 (!) 140/70 (!) 149/83 (!) 178/86  Pulse: 78 98 78 83  Resp:  16 16   Temp: 97.9 F (36.6 C) 98.5 F (36.9 C) (!) 97.3 F (36.3 C)   TempSrc: Oral Oral Axillary   SpO2: 100% 96% 98% 100%  Weight:      Height:        Wt Readings from Last 3 Encounters:  05/25/22 62.1 kg  05/17/22 81.6 kg  05/13/22 62.6 kg     Intake/Output Summary (Last 24 hours) at 05/27/2022 1103 Last data filed at 05/27/2022 0813 Gross per 24 hour  Intake --  Output 200 ml  Net -200 ml     Physical Exam  Awake Alert, No new F.N deficits, Normal affect [place].AT,PERRAL Supple Neck, No JVD,   Symmetrical Chest wall movement, Good air movement bilaterally, CTAB RRR,No Gallops, Rubs or new Murmurs,  +ve B.Sounds, Abd Soft, No tenderness,   No Cyanosis, Clubbing or edema       Data Review:    Recent Labs  Lab 05/21/22 0457 05/23/22 0407 05/25/22 0820 05/26/22 0318  WBC 9.2 7.7 8.3 7.3  HGB 8.1* 8.4* 8.4* 8.0*  HCT 25.2* 27.8* 27.8* 26.8*  PLT 266 315 336 389  MCV 89.4 93.6 93.9 94.7  MCH 28.7 28.3 28.4 28.3  MCHC 32.1 30.2 30.2 29.9*  RDW 14.2 14.2 14.4 14.5  LYMPHSABS  --  0.9 1.0 1.0  MONOABS  --  0.9 1.3* 1.1*  EOSABS  --  0.2 0.3 0.3  BASOSABS  --  0.0 0.1 0.1    Recent Labs  Lab 05/21/22 0457 05/21/22 0657 05/23/22 0407 05/25/22 0820 05/26/22 0318  NA  136  --  136 137 135  K 4.7  --  4.2 4.6 3.6  CL 97*  --  96* 96* 93*  CO2 24  --  29 27 31   ANIONGAP 15  --  11 14 11   GLUCOSE 107*  --  97 113* 185*  BUN 59*  --  42* 38* 24*  CREATININE 9.42*  --  7.44* 7.65* 5.58*  AST 19  --   --   --   --   ALT 19  --   --   --   --   ALKPHOS 131*  --   --   --   --   BILITOT 0.6  --   --   --   --   ALBUMIN 2.4*  --   --   --   --   CRP  --  12.6* 7.3* 6.2*  --   HGBA1C 8.2*  --   --   --   --   BNP  --  >4,500.0* >4,500.0* 3,829.2*  --   CALCIUM 8.0*  --  8.2* 8.4* 8.2*    Radiology Reports ECHOCARDIOGRAM COMPLETE  Result Date: 05/24/2022    ECHOCARDIOGRAM REPORT   Patient Name:   NEHEMYAH FOUSHEE Date of Exam: 05/24/2022 Medical Rec #:  [id number]       Height:       67.0 in Accession #:    [id number]      Weight:       146.4 lb Date of Birth:  [birth year]      BSA:          1.771 m Patient Age:    11 years        BP:           141/76 mmHg Patient Gender: M               HR:           73 bpm. Exam Location:  Inpatient Procedure: 2D Echo, Cardiac Doppler and Color Doppler Indications:    Bacteremia  History:        Patient has prior history of Echocardiogram examinations, most                 recent 11/27/2020. CHF, Arrythmias:LBBB; Risk                 Factors:Hypertension, Dyslipidemia and Diabetes. ESRD.  Sonographer:    Clayton Lefort RDCS (AE) Referring Phys: [id number] [place]  1. Left ventricular ejection fraction, by estimation, is 25 to 30%. The left ventricle has severely decreased function. The left ventricle demonstrates global hypokinesis. There is severe concentric left ventricular hypertrophy. Left ventricular diastolic parameters are consistent with Grade II diastolic dysfunction (pseudonormalization).  2. Right ventricular systolic function is normal. The right ventricular size is normal. There is  moderately elevated pulmonary artery systolic pressure.  3. Right atrial size was moderately dilated.  4. A small pericardial effusion  is present. The pericardial effusion is circumferential.  5. No evidence of mitral valve regurgitation.  6. Tricuspid valve regurgitation is moderate.  7. The aortic valve is tricuspid. Aortic valve regurgitation is not visualized. Aortic valve sclerosis is present, with no evidence of aortic valve stenosis.  8. The inferior vena cava is dilated in size with >50% respiratory variability, suggesting right atrial pressure of 8 mmHg. Comparison(s): No significant change from prior study. FINDINGS  Left Ventricle: Left ventricular ejection fraction, by estimation, is 25 to 30%. The left ventricle has severely decreased function. The left ventricle demonstrates global hypokinesis. The left ventricular internal cavity size was normal in size. There is severe concentric left ventricular hypertrophy. Abnormal (paradoxical) septal motion, consistent with left bundle branch block. Left ventricular diastolic parameters are consistent with Grade II diastolic dysfunction (pseudonormalization). Right Ventricle: The right ventricular size is normal. Right ventricular systolic function is normal. There is moderately elevated pulmonary artery systolic pressure. The tricuspid regurgitant velocity is 3.27 m/s, and with an assumed right atrial pressure of 8 mmHg, the estimated right ventricular systolic pressure is 40.8 mmHg. Left Atrium: Left atrial size was normal in size. Right Atrium: Right atrial size was moderately dilated. Pericardium: A small pericardial effusion is present. The pericardial effusion is circumferential. Mitral Valve: No evidence of mitral valve regurgitation. Tricuspid Valve: Tricuspid valve regurgitation is moderate. Aortic Valve: The aortic valve is tricuspid. Aortic valve regurgitation is not visualized. Aortic valve sclerosis is present, with no evidence of aortic valve stenosis. Aortic valve mean gradient measures 5.0 mmHg. Aortic valve peak gradient measures 9.1  mmHg. Aortic valve area, by VTI measures  2.44 cm. Pulmonic Valve: Pulmonic valve regurgitation is not visualized. Aorta: The aortic root and ascending aorta are structurally normal, with no evidence of dilitation. Venous: The inferior vena cava is dilated in size with greater than 50% respiratory variability, suggesting right atrial pressure of 8 mmHg. IAS/Shunts: No atrial level shunt detected by color flow Doppler.  LEFT VENTRICLE PLAX 2D LVIDd:         4.10 cm   Diastology LVIDs:         3.60 cm   LV e' medial:    4.35 cm/s LV PW:         2.00 cm   LV E/e' medial:  17.8 LV IVS:        1.90 cm   LV e' lateral:   6.42 cm/s LVOT diam:     2.00 cm   LV E/e' lateral: 12.1 LV SV:         63 LV SV Index:   36 LVOT Area:     3.14 cm  RIGHT VENTRICLE             IVC RV Basal diam:  3.30 cm     IVC diam: 2.00 cm RV S prime:     11.00 cm/s TAPSE (M-mode): 2.2 cm LEFT ATRIUM             Index        RIGHT ATRIUM           Index LA diam:        3.50 cm 1.98 cm/m   RA Area:     23.90 cm LA Vol (A2C):   31.9 ml 18.01 ml/m  RA Volume:   80.10 ml  45.23 ml/m LA Vol (A4C):  39.6 ml 22.36 ml/m LA Biplane Vol: 37.6 ml 21.23 ml/m  AORTIC VALVE AV Area (Vmax):    2.56 cm AV Area (Vmean):   2.45 cm AV Area (VTI):     2.44 cm AV Vmax:           151.00 cm/s AV Vmean:          109.000 cm/s AV VTI:            0.260 m AV Peak Grad:      9.1 mmHg AV Mean Grad:      5.0 mmHg LVOT Vmax:         123.00 cm/s LVOT Vmean:        85.100 cm/s LVOT VTI:          0.202 m LVOT/AV VTI ratio: 0.78  AORTA Ao Root diam: 3.00 cm Ao Asc diam:  2.90 cm MITRAL VALVE               TRICUSPID VALVE MV Area (PHT): 3.65 cm    TR Peak grad:   42.8 mmHg MV Decel Time: 208 msec    TR Vmax:        327.00 cm/s MV E velocity: 77.60 cm/s MV A velocity: 58.70 cm/s  SHUNTS MV E/A ratio:  1.32        Systemic VTI:  0.20 m                            Systemic Diam: 2.00 cm Phineas Inches Electronically signed by Phineas Inches Signature Date/Time: 05/24/2022/3:15:15 PM    Final    CT CHEST WO  CONTRAST  Result Date: 05/24/2022 CLINICAL DATA:  Pneumonia EXAM: CT CHEST WITHOUT CONTRAST TECHNIQUE: Multidetector CT imaging of the chest was performed following the standard protocol without IV contrast. RADIATION DOSE REDUCTION: This exam was performed according to the departmental dose-optimization program which includes automated exposure control, adjustment of the mA and/or kV according to patient size and/or use of iterative reconstruction technique. COMPARISON:  Chest CT dated May 13, 2022 FINDINGS: Cardiovascular: Cardiomegaly. Unchanged small to moderate pericardial effusion. No coronary artery calcifications. Unchanged dilation of the main pulmonary artery Mediastinum/Nodes: Esophagus and thyroid are unremarkable. No pathologically enlarged lymph nodes seen in the chest Lungs/Pleura: Central airways are patent. 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 Increased right middle lobe consolidation and bilateral lower lobe ground-glass opacities. New no evidence of pneumothorax. Small right-greater-than-left pleural effusions. Upper Abdomen: No acute abnormality. Musculoskeletal: New mildly displaced fracture of the lateral right sixth rib. No chest wall mass or suspicious bone lesions identified. Diffuse soft tissue anasarca. IMPRESSION: 1. New right middle lobe consolidation which is concerning for pneumonia. 2. Bilateral ground-glass opacities are seen in the lower lobes, which may be due to pulmonary edema or multifocal infection. 3. New small right-greater-than-left pleural effusions. 4. New mildly displaced fracture of the lateral right 6th rib. 5. Cardiomegaly and unchanged small to moderate pericardial effusion. Electronically Signed   By: Yetta Glassman M.D.   On: 05/24/2022 14:03      Signature  -   Lala Lund M.D on 05/27/2022 at 11:03 AM   -  To page go to [email or web address]

## 2022-05-27 NOTE — Progress Notes (Signed)
Subjective: No complaints,  Objective Vital signs in last 24 hours: Vitals:   05/26/22 1234 05/26/22 2210 05/27/22 0521 05/27/22 0829  BP: (!) 157/78 (!) 140/70 (!) 149/83 (!) 178/86  Pulse: 78 98 78 83  Resp:  16 16   Temp: 97.9 F (36.6 C) 98.5 F (36.9 C) (!) 97.3 F (36.3 C)   TempSrc: Oral Oral Axillary   SpO2: 100% 96% 98% 100%  Weight:      Height:       Weight change:   Physical Exam: General: Alert adult male currently appears chronically ill NAD Heart: RRR no MRG Lungs: CTA bilaterally Abdomen: BS, soft NTND Extremities: No pedal edema  Dialysis Access: As bruit right arm AVG  Op Dialysis Orders:  TTS - [facility] 4hrs, BFR 400, DFR 800,  EDW 61.5kg, 2K/ 2.5Ca Heparin 2000 units with HD No VDRA/ESA ordered  Problem/Plan: SOB toxemia with strep pneumonia bacteremia/COVID-19 infection = IV antibiotics resolved now on Augmentin, qualifies currently requires O2 at home, currently homeless therefore not discharged yet with need for home O2, off COVID precautions ESRD -on TTS schedule Anemia -Hgb 8.0 Aranesp 100 given on January 4, transfuse Hgb less than 7 Secondary hyperparathyroidism -corrected calcium okay, phosphorus 4.2, on calcium acetate binder, currently no vitamin D, check PTH next dialysis HTN/volume -BP stable on BP meds amlodipine 10 mg daily, Coreg 6.25 mg twice daily, Bidil  and volume stable controlled with UF  Ernest Haber, PA-C [facility] Beeper [phone] 05/27/2022,2:42 PM  LOS: 7 days   Labs: Basic Metabolic Panel: Recent Labs  Lab 05/23/22 0407 05/25/22 0820 05/26/22 0318  NA 136 137 135  K 4.2 4.6 3.6  CL 96* 96* 93*  CO2 29 27 31   GLUCOSE 97 113* 185*  BUN 42* 38* 24*  CREATININE 7.44* 7.65* 5.58*  CALCIUM 8.2* 8.4* 8.2*  PHOS 4.2  --   --    Liver Function Tests: Recent Labs  Lab 05/21/22 0457  AST 19  ALT 19  ALKPHOS 131*  BILITOT 0.6  PROT 6.1*  ALBUMIN 2.4*   No results for input(s): "LIPASE", "AMYLASE"  in the last 168 hours. No results for input(s): "AMMONIA" in the last 168 hours. CBC: Recent Labs  Lab 05/21/22 0457 05/23/22 0407 05/25/22 0820 05/26/22 0318  WBC 9.2 7.7 8.3 7.3  NEUTROABS  --  5.5 5.6 4.6  HGB 8.1* 8.4* 8.4* 8.0*  HCT 25.2* 27.8* 27.8* 26.8*  MCV 89.4 93.6 93.9 94.7  PLT 266 315 336 389   Cardiac Enzymes: No results for input(s): "CKTOTAL", "CKMB", "CKMBINDEX", "TROPONINI" in the last 168 hours. CBG: Recent Labs  Lab 05/26/22 0746 05/26/22 1237 05/26/22 1616 05/27/22 0818 05/27/22 1306  GLUCAP 176* 158* 168* 170* 164*    Studies/Results: No results found. Medications:   amLODipine  10 mg Oral Daily   amoxicillin-clavulanate  2 tablet Oral Q12H   calcium acetate  2,001 mg Oral TID   carvedilol  6.25 mg Oral BID WC   Chlorhexidine Gluconate Cloth  6 each Topical Q0600   [START ON 05/30/2022] darbepoetin (ARANESP) injection - DIALYSIS  100 mcg Subcutaneous Q Thu-1800   famotidine  20 mg Oral Daily   feeding supplement (NEPRO CARB STEADY)  237 mL Oral TID WC   heparin  5,000 Units Subcutaneous Q8H   insulin aspart  0-6 Units Subcutaneous TID WC   isosorbide-hydrALAZINE  1 tablet Oral TID   sevelamer carbonate  1,600 mg Oral TID WC

## 2022-05-27 NOTE — Progress Notes (Signed)
PT Cancellation Note  Patient Details Name: [name] MRN: [id number] DOB: [birth year]   Cancelled Treatment:    Reason Eval/Treat Not Completed: Fatigue/lethargy limiting ability to participate - pt declines OOB, states his doctor walked with him this morning and he is too tired to participate, even with mod encouragement.   Stacie Glaze, PT DPT Acute Rehabilitation Services Pager [phone]  Office [phone]    Louis Matte 05/27/2022, 2:33 PM

## 2022-05-27 NOTE — Progress Notes (Signed)
D/C order noted. Chart reviewed and case discussed with case management staff. Pt still requiring O2 and pt is homeless. Therefore, pt cannot d/c until pt is off O2. Contacted [facility] staff to provide update. Will assist as needed.   Melven Sartorius Renal Navigator [phone]

## 2022-05-28 ENCOUNTER — Other Ambulatory Visit (HOSPITAL_COMMUNITY): Payer: Self-pay

## 2022-05-28 DIAGNOSIS — J189 Pneumonia, unspecified organism: Secondary | ICD-10-CM | POA: Diagnosis not present

## 2022-05-28 LAB — CULTURE, BLOOD (ROUTINE X 2)
Culture: NO GROWTH
Special Requests: ADEQUATE

## 2022-05-28 LAB — GLUCOSE, CAPILLARY
Glucose-Capillary: 188 mg/dL — ABNORMAL HIGH (ref 70–99)
Glucose-Capillary: 230 mg/dL — ABNORMAL HIGH (ref 70–99)

## 2022-05-28 MED ORDER — ISOSORB DINITRATE-HYDRALAZINE 20-37.5 MG PO TABS
1.0000 | ORAL_TABLET | Freq: Three times a day (TID) | ORAL | 0 refills | Status: DC
  Filled 2022-05-28: qty 90, 30d supply, fill #0

## 2022-05-28 MED ORDER — ACETAMINOPHEN 500 MG PO TABS
500.0000 mg | ORAL_TABLET | Freq: Three times a day (TID) | ORAL | 0 refills | Status: DC | PRN
  Filled 2022-05-28: qty 20, 7d supply, fill #0

## 2022-05-28 MED ORDER — CARVEDILOL 6.25 MG PO TABS
6.2500 mg | ORAL_TABLET | Freq: Two times a day (BID) | ORAL | 0 refills | Status: DC
  Filled 2022-05-28: qty 60, 30d supply, fill #0

## 2022-05-28 MED ORDER — AMOXICILLIN-POT CLAVULANATE 500-125 MG PO TABS
1.0000 | ORAL_TABLET | Freq: Two times a day (BID) | ORAL | 0 refills | Status: AC
  Filled 2022-05-28: qty 14, 7d supply, fill #0

## 2022-05-28 MED ORDER — SEVELAMER CARBONATE 800 MG PO TABS
ORAL_TABLET | ORAL | 0 refills | Status: DC
  Filled 2022-05-28: qty 240, 30d supply, fill #0

## 2022-05-28 MED ORDER — "INSULIN SYRINGE-NEEDLE U-100 25G X 1"" 1 ML MISC"
0 refills | Status: DC
  Filled 2022-05-28: qty 30, fill #0

## 2022-05-28 MED ORDER — INSULIN PEN NEEDLE 32G X 4 MM MISC
1 refills | Status: DC
  Filled 2022-05-28: qty 100, 30d supply, fill #0

## 2022-05-28 MED ORDER — AMOXICILLIN-POT CLAVULANATE 250-125 MG PO TABS
2.0000 | ORAL_TABLET | Freq: Two times a day (BID) | ORAL | 0 refills | Status: DC
  Filled 2022-05-28: qty 14, 4d supply, fill #0

## 2022-05-28 MED ORDER — ASPIRIN LOW DOSE 81 MG PO TBEC: 81.0000 mg | DELAYED_RELEASE_TABLET | Freq: Every day | ORAL | 0 refills | Status: DC

## 2022-05-28 MED ORDER — INSULIN ASPART 100 UNIT/ML FLEXPEN
PEN_INJECTOR | SUBCUTANEOUS | 0 refills | Status: DC
  Filled 2022-05-28: qty 9, 30d supply, fill #0

## 2022-05-28 MED ORDER — CALCIUM ACETATE (PHOS BINDER) 667 MG PO CAPS
2001.0000 mg | ORAL_CAPSULE | Freq: Three times a day (TID) | ORAL | 0 refills | Status: DC
  Filled 2022-05-28: qty 270, 30d supply, fill #0

## 2022-05-28 MED ORDER — GLUCOSE BLOOD VI STRP
ORAL_STRIP | 0 refills | Status: DC
  Filled 2022-05-28: qty 100, 25d supply, fill #0

## 2022-05-28 MED ORDER — FINGERSTIX LANCETS MISC
0 refills | Status: DC
  Filled 2022-05-28: qty 100, 25d supply, fill #0

## 2022-05-28 MED ORDER — BLOOD GLUCOSE MONITOR KIT
PACK | 0 refills | Status: DC
  Filled 2022-05-28: qty 1, 30d supply, fill #0

## 2022-05-28 NOTE — Progress Notes (Signed)
Pt's discharge pending amb O2 sats. If pt qualifies for O2, pt may not be able to be discharged due to living status. Pt will discharge after HD tx.

## 2022-05-28 NOTE — Progress Notes (Signed)
Physical Therapy Treatment Patient Details Name: [name] MRN: [id number] DOB: 1971/04/02 Today's Date: 05/28/2022   History of Present Illness Pt is 52 year old presented [facility] on  [facility]/22 with chest pain, abdominal pain, and vomiting. Pt with PNA and recent covid + (12/15). PMH - ESRD on HD, HTN; DM; chronic systolic CHF; anxiety, depression.    PT Comments    Pt is independent with mobility at this time, and maintained SPO2 97% and greater on RA when breathing deeply during gait. MD and RN aware, pt with no further PT needs at this time.   SATURATION QUALIFICATIONS: (This note is used to comply with regulatory documentation for home oxygen)  Patient Saturations on Room Air at Rest = 99%  Patient Saturations on Room Air while Ambulating = 97%  Patient Saturations on -- Liters of oxygen while Ambulating = --%  Please briefly explain why patient needs home oxygen: n/a   Recommendations for follow up therapy are one component of a multi-disciplinary discharge planning process, led by the attending physician.  Recommendations may be updated based on patient status, additional functional criteria and insurance authorization.  Follow Up Recommendations  No PT follow up     Assistance Recommended at Discharge None  Patient can return home with the following     Equipment Recommendations  None recommended by PT    Recommendations for Other Services       Precautions / Restrictions Precautions Precautions: Other (comment) Precaution Comments: watch O2 sats Restrictions Weight Bearing Restrictions: No     Mobility  Bed Mobility Overal bed mobility: Modified Independent                  Transfers Overall transfer level: Independent Equipment used: None Transfers: Sit to/from Stand Sit to Stand: Independent                Ambulation/Gait Ambulation/Gait assistance: Independent Gait Distance (Feet): 220 Feet Assistive device: None Gait  Pattern/deviations: Step-through pattern, WFL(Within Functional Limits) Gait velocity: wfl     General Gait Details: wfl speed, step length, posture. cues for deep breaths and pursed lip breathing   Stairs             Wheelchair Mobility    Modified Rankin (Stroke Patients Only)       Balance Overall balance assessment: Modified Independent                                          Cognition Arousal/Alertness: Awake/alert Behavior During Therapy: WFL for tasks assessed/performed Overall Cognitive Status: Within Functional Limits for tasks assessed                                 General Comments: irritable with PT, states "I already walked", only agreeable when MD insisted        Exercises      General Comments        Pertinent Vitals/Pain Pain Assessment Pain Assessment: No/denies pain    Home Living                          Prior Function            PT Goals (current goals can now be found in the care plan section) Acute Rehab PT Goals  Patient Stated Goal: Find a place to stay off the streets PT Goal Formulation: With patient Time For Goal Achievement: 05/28/22 Potential to Achieve Goals: Good Progress towards PT goals: Progressing toward goals    Frequency    Min 3X/week      PT Plan Current plan remains appropriate    Co-evaluation              AM-PAC PT "6 Clicks" Mobility   Outcome Measure  Help needed turning from your back to your side while in a flat bed without using bedrails?: None Help needed moving from lying on your back to sitting on the side of a flat bed without using bedrails?: None Help needed moving to and from a bed to a chair (including a wheelchair)?: None Help needed standing up from a chair using your arms (e.g., wheelchair or bedside chair)?: None Help needed to walk in hospital room?: None Help needed climbing 3-5 steps with a railing? : None 6 Click Score:  24    End of Session   Activity Tolerance: Patient tolerated treatment well Patient left: in chair;with call bell/phone within reach Nurse Communication: Mobility status PT Visit Diagnosis: Other abnormalities of gait and mobility (R26.89)     Time: 1050-1100 PT Time Calculation (min) (ACUTE ONLY): 10 min  Charges:  $Therapeutic Activity: 8-22 mins                     Stacie Glaze, PT DPT Acute Rehabilitation Services Pager [phone]  Office [phone]    Wilhoit E Stroup 05/28/2022, 11:08 AM

## 2022-05-28 NOTE — TOC Transition Note (Addendum)
Transition of Care [facility]) - CM/SW Discharge Note   Patient Details  Name: [name] MRN: [id number] Date of Birth: 1970/10/17  Transition of Care [facility]) CM/SW Contact:  Benard Halsted, LCSW Phone Number: 05/28/2022, 12:40 PM   Clinical Narrative:    CSW met with patient. He stated he is hoping for someone to get him a hotel room. CSW explained that the hospital is unable to provide that for him but provided a [employer] with a shelter listing an info for the [facility] and their white flag info. He stated that the [facility] paid for him to have a hotel last year. CSW encouraged him to call them to ask and he stated he thought the CSW would call. CSW explained it would be more helpful for him to call and answer any questions they have for him. CSW inquired to if he had heard from the [facility] program and he stated they were full. Patient agreed to referral for [place] [facility] and provided cell # of [phone]. CSW inquired about family support and he stated he had one homeless brother in [place] and one here in jail. He reported his Disability was approved in October but he calls weekly to check and it has not reached social security yet for the money portion.  CSW attempted to call the [facility] with no answer. CSW notified patient that CSW can arrange taxi for transport if he has an address to provide.    Final next level of care: Homeless Shelter Barriers to Discharge: Barriers Resolved   Patient Goals and CMS Choice      Discharge Placement                      Patient and family notified of of transfer: 05/28/22  Discharge Plan and Services Additional resources added to the After Visit Summary for                                       Social Determinants of Health (SDOH) Interventions [place]: Food Insecurity Present (05/08/2022)  Housing: Medium Risk (05/08/2022)  Transportation Needs: Unmet Transportation Needs (05/08/2022)   Utilities: Not At Risk (05/08/2022)  Depression (PHQ2-9): Low Risk  (10/13/2019)  Financial Resource Strain: High Risk (05/08/2022)  Tobacco Use: Low Risk  (05/20/2022)     Readmission Risk Interventions    11/07/2021    4:21 PM 02/19/2021    3:52 PM  Readmission Risk Prevention Plan  Transportation Screening Complete Complete  Medication Review Press photographer) Complete Complete  PCP or Specialist appointment within 3-5 days of discharge Complete Complete  HRI or [place] Complete Complete  SW Recovery Care/Counseling Consult Complete Complete  Palliative Care Screening Not Applicable Not [place] Not Applicable Not Applicable

## 2022-05-28 NOTE — Procedures (Signed)
HD note:  The patient was offered a treatment time this morning and this afternoon and refused both times.  He has a pattern of refusal and was offered an afternoon time after the morning refusal to encourage him to have a treatment.

## 2022-05-28 NOTE — Progress Notes (Signed)
Patient discharged per md orders. Patient is stable at time of discharge, no indications of acute distress noted at time of discharge. SpO2 100% on RA, vitals stable. [employer] cab voucher obtained for patient via [facility] case management, called cab and ETA of approx. 10-15 minutes, voucher provided to patient and informed patient on ETA. Patient has TOC medications that have been reviewed with him and given to patient and in his possession at the time of discharge. Patient has his evening meal with him that he will take at time of discharge. Case management following and aware of patient's circumstances and discharge today. Resources for patient to contact given to patient by CM. Patient states he called [facility] and he plans to go there tonight and this is where cab voucher was called for this destination. Patient belongings with patient at discharge, patient confirms he has all of his belongings at discharge. No PIV at time of discharge. Reviewed discharge instructions, medications, follow up with patient, allowed time for questions, patient verbalized understanding. Hard copy of discharge instructions provided to patient. Provided patient with blankets, tooth brush, paste, socks, deodorant, soap. MD Candiss Norse) is aware that patient has been refusing his PO meds today and that he refused to have dialysis today on 2 separate occasions despite encouragement to take prescribed medications as ordered and dialysis.

## 2022-05-28 NOTE — Progress Notes (Signed)
Informed MD that patient is refusing his PO meds, asked patient x3. Patient is also refusing dialysis for today, he previously refused to go on prior shift also. And is refusing now as well. Informed MD Candiss Norse) and ok per MD to discharge.

## 2022-05-28 NOTE — Progress Notes (Signed)
Subjective: Seen up in bedside chair looking at window.  He refuses HD today, unable to give me a reason really.  He said he would do next dialysis on Thursday as an outpatient.  Denying shortness of breath has no O2 on  Objective Vital signs in last 24 hours: Vitals:   05/27/22 2354 05/28/22 0500 05/28/22 0817 05/28/22 1244  BP: 133/83 138/84 128/75 (!) 143/74  Pulse: 76  70 70  Resp: 17 18 18 18   Temp: 97.6 F (36.4 C) 98.1 F (36.7 C) 97.6 F (36.4 C) 98 F (36.7 C)  TempSrc: Axillary Axillary Oral Oral  SpO2: 99% 100%    Weight:      Height:       Weight change:   Physical Exam: General: Alert adult male currently appears chronically ill NAD Heart: RRR no MRG Lungs: CTA bilaterally Abdomen: BS, soft NTND Extremities: Trace bi pedal edema  Dialysis Access: + bruit right arm AVG   Op Dialysis Orders:  TTS - [facility] 4hrs, BFR 400, DFR 800,  EDW 61.5kg, 2K/ 2.5Ca Heparin 2000 units with HD No VDRA/ESA ordered   Problem/Plan: SOB toxemia with strep pneumonia bacteremia/COVID-19 infection = IV antibiotics resolved now on Augmentin. ESRD -on TTS schedule Anemia -Hgb 8.0 Aranesp 100 given on January 4, transfuse Hgb less than 7 Secondary hyperparathyroidism -corrected calcium okay, phosphorus 4.2, on calcium acetate binder, currently no vitamin D, check PTH next dialysis HTN/volume -BP stable on BP meds amlodipine 10 mg daily, Coreg 6.25 mg twice daily, Bidil  and volume stable controlled with UF  Ernest Haber, PA-C [facility] Beeper [phone] 05/28/2022,1:10 PM  LOS: 8 days   Labs: Basic Metabolic Panel: Recent Labs  Lab 05/23/22 0407 05/25/22 0820 05/26/22 0318  NA 136 137 135  K 4.2 4.6 3.6  CL 96* 96* 93*  CO2 29 27 31   GLUCOSE 97 113* 185*  BUN 42* 38* 24*  CREATININE 7.44* 7.65* 5.58*  CALCIUM 8.2* 8.4* 8.2*  PHOS 4.2  --   --    Liver Function Tests: No results for input(s): "AST", "ALT", "ALKPHOS", "BILITOT", "PROT", "ALBUMIN" in the  last 168 hours. No results for input(s): "LIPASE", "AMYLASE" in the last 168 hours. No results for input(s): "AMMONIA" in the last 168 hours. CBC: Recent Labs  Lab 05/23/22 0407 05/25/22 0820 05/26/22 0318  WBC 7.7 8.3 7.3  NEUTROABS 5.5 5.6 4.6  HGB 8.4* 8.4* 8.0*  HCT 27.8* 27.8* 26.8*  MCV 93.6 93.9 94.7  PLT 315 336 389   Cardiac Enzymes: No results for input(s): "CKTOTAL", "CKMB", "CKMBINDEX", "TROPONINI" in the last 168 hours. CBG: Recent Labs  Lab 05/27/22 1306 05/27/22 1640 05/27/22 2144 05/28/22 0822 05/28/22 1246  GLUCAP 164* 106* 216* 188* 230*    Studies/Results: No results found. Medications:   amLODipine  10 mg Oral Daily   amoxicillin-clavulanate  2 tablet Oral Q12H   calcium acetate  2,001 mg Oral TID   carvedilol  6.25 mg Oral BID WC   Chlorhexidine Gluconate Cloth  6 each Topical Q0600   [START ON 05/30/2022] darbepoetin (ARANESP) injection - DIALYSIS  100 mcg Subcutaneous Q Thu-1800   famotidine  20 mg Oral Daily   feeding supplement (NEPRO CARB STEADY)  237 mL Oral TID WC   heparin  5,000 Units Subcutaneous Q8H   insulin aspart  0-6 Units Subcutaneous TID WC   isosorbide-hydrALAZINE  1 tablet Oral TID   sevelamer carbonate  1,600 mg Oral TID WC

## 2022-05-29 NOTE — Progress Notes (Signed)
Late Note Entry  Contacted [facility] this am to advise clinic that pt was d/c late yesterday and pt should resume care tomorrow. Clinic advised pt refused HD yesterday. Pt was provided info last week where clinic social worker arranged transportation to HD from the [facility] on TTS. Info was placed on AVS as well.   Melven Sartorius Renal Navigator [phone]

## 2022-05-31 NOTE — Progress Notes (Signed)
05/31/22: CSW received email from [address] with the [facility] stating that patient is in a pallet home as of today.  Gilmore Laroche, MSW, [facility]

## 2022-06-09 ENCOUNTER — Emergency Department (HOSPITAL_COMMUNITY): Payer: Medicaid Other

## 2022-06-09 ENCOUNTER — Emergency Department (HOSPITAL_COMMUNITY)
Admission: EM | Admit: 2022-06-09 | Discharge: 2022-06-09 | Disposition: A | Discharge status: Home / Self Care | Payer: Medicaid Other | Attending: Emergency Medicine | Admitting: Emergency Medicine

## 2022-06-09 ENCOUNTER — Other Ambulatory Visit: Payer: Self-pay

## 2022-06-09 DIAGNOSIS — E875 Hyperkalemia: Secondary | ICD-10-CM | POA: Diagnosis not present

## 2022-06-09 DIAGNOSIS — Z794 Long term (current) use of insulin: Secondary | ICD-10-CM | POA: Insufficient documentation

## 2022-06-09 DIAGNOSIS — Z992 Dependence on renal dialysis: Secondary | ICD-10-CM | POA: Insufficient documentation

## 2022-06-09 DIAGNOSIS — Z79899 Other long term (current) drug therapy: Secondary | ICD-10-CM | POA: Insufficient documentation

## 2022-06-09 DIAGNOSIS — I509 Heart failure, unspecified: Secondary | ICD-10-CM | POA: Insufficient documentation

## 2022-06-09 DIAGNOSIS — Z91158 Patient's noncompliance with renal dialysis for other reason: Secondary | ICD-10-CM

## 2022-06-09 DIAGNOSIS — E1122 Type 2 diabetes mellitus with diabetic chronic kidney disease: Secondary | ICD-10-CM | POA: Diagnosis not present

## 2022-06-09 DIAGNOSIS — R109 Unspecified abdominal pain: Secondary | ICD-10-CM | POA: Diagnosis present

## 2022-06-09 DIAGNOSIS — I132 Hypertensive heart and chronic kidney disease with heart failure and with stage 5 chronic kidney disease, or end stage renal disease: Secondary | ICD-10-CM | POA: Insufficient documentation

## 2022-06-09 DIAGNOSIS — Z7984 Long term (current) use of oral hypoglycemic drugs: Secondary | ICD-10-CM | POA: Diagnosis not present

## 2022-06-09 DIAGNOSIS — Z7982 Long term (current) use of aspirin: Secondary | ICD-10-CM | POA: Diagnosis not present

## 2022-06-09 DIAGNOSIS — N186 End stage renal disease: Secondary | ICD-10-CM | POA: Diagnosis not present

## 2022-06-09 LAB — COMPREHENSIVE METABOLIC PANEL
ALT: 29 U/L (ref 0–44)
AST: 36 U/L (ref 15–41)
Albumin: 3 g/dL — ABNORMAL LOW (ref 3.5–5.0)
Alkaline Phosphatase: 241 U/L — ABNORMAL HIGH (ref 38–126)
Anion gap: 17 — ABNORMAL HIGH (ref 5–15)
BUN: 105 mg/dL — ABNORMAL HIGH (ref 6–20)
CO2: 16 mmol/L — ABNORMAL LOW (ref 22–32)
Calcium: 7.9 mg/dL — ABNORMAL LOW (ref 8.9–10.3)
Chloride: 104 mmol/L (ref 98–111)
Creatinine, Ser: 15.07 mg/dL — ABNORMAL HIGH (ref 0.61–1.24)
GFR, Estimated: 4 mL/min — ABNORMAL LOW (ref >60)
Glucose, Bld: 132 mg/dL — ABNORMAL HIGH (ref 70–99)
Potassium: 5.8 mmol/L — ABNORMAL HIGH (ref 3.5–5.1)
Sodium: 137 mmol/L (ref 135–145)
Total Bilirubin: 0.3 mg/dL (ref 0.3–1.2)
Total Protein: 7.2 g/dL (ref 6.5–8.1)

## 2022-06-09 LAB — CBC WITH DIFFERENTIAL/PLATELET
Abs Immature Granulocytes: 0.02 10*3/uL (ref 0.00–0.07)
Basophils Absolute: 0 10*3/uL (ref 0.0–0.1)
Basophils Relative: 0 %
Eosinophils Absolute: 0.3 10*3/uL (ref 0.0–0.5)
Eosinophils Relative: 5 %
HCT: 33.5 % — ABNORMAL LOW (ref 39.0–52.0)
Hemoglobin: 10.4 g/dL — ABNORMAL LOW (ref 13.0–17.0)
Immature Granulocytes: 0 %
Lymphocytes Relative: 14 %
Lymphs Abs: 1 10*3/uL (ref 0.7–4.0)
MCH: 28.8 pg (ref 26.0–34.0)
MCHC: 31 g/dL (ref 30.0–36.0)
MCV: 92.8 fL (ref 80.0–100.0)
Monocytes Absolute: 0.9 10*3/uL (ref 0.1–1.0)
Monocytes Relative: 12 %
Neutro Abs: 4.9 10*3/uL (ref 1.7–7.7)
Neutrophils Relative %: 69 %
Platelets: 320 10*3/uL (ref 150–400)
RBC: 3.61 MIL/uL — ABNORMAL LOW (ref 4.22–5.81)
RDW: 17 % — ABNORMAL HIGH (ref 11.5–15.5)
WBC: 7.1 10*3/uL (ref 4.0–10.5)
nRBC: 0.3 % — ABNORMAL HIGH (ref 0.0–0.2)

## 2022-06-09 LAB — LIPASE, BLOOD: Lipase: 36 U/L (ref 11–51)

## 2022-06-09 MED ORDER — ANTICOAGULANT SODIUM CITRATE 4% (200MG/5ML) IV SOLN
5.0000 mL | Status: DC | PRN
  Filled 2022-06-09: qty 5

## 2022-06-09 MED ORDER — HEPARIN SODIUM (PORCINE) 1000 UNIT/ML DIALYSIS
1000.0000 [IU] | INTRAMUSCULAR | Status: DC | PRN
  Filled 2022-06-09: qty 1

## 2022-06-09 MED ORDER — SODIUM ZIRCONIUM CYCLOSILICATE 10 G PO PACK
10.0000 g | PACK | Freq: Once | ORAL | Status: AC
  Administered 2022-06-09: 10 g via ORAL
  Filled 2022-06-09: qty 1

## 2022-06-09 MED ORDER — PENTAFLUOROPROP-TETRAFLUOROETH EX AERO
1.0000 | INHALATION_SPRAY | CUTANEOUS | Status: DC | PRN
  Filled 2022-06-09: qty 116

## 2022-06-09 MED ORDER — ALTEPLASE 2 MG IJ SOLR: 2.0000 mg | Freq: Once | INTRAMUSCULAR | Status: DC | PRN

## 2022-06-09 MED ORDER — LIDOCAINE-PRILOCAINE 2.5-2.5 % EX CREA
1.0000 | TOPICAL_CREAM | CUTANEOUS | Status: DC | PRN
  Filled 2022-06-09: qty 5

## 2022-06-09 MED ORDER — HEPARIN SODIUM (PORCINE) 1000 UNIT/ML DIALYSIS
2000.0000 [IU] | INTRAMUSCULAR | Status: DC | PRN
  Administered 2022-06-09: 2000 [IU] via INTRAVENOUS_CENTRAL
  Filled 2022-06-09 (×3): qty 2

## 2022-06-09 MED ORDER — CHLORHEXIDINE GLUCONATE CLOTH 2 % EX PADS: 6.0000 | MEDICATED_PAD | Freq: Every day | CUTANEOUS | Status: DC

## 2022-06-09 MED ORDER — LIDOCAINE HCL (PF) 1 % IJ SOLN: 5.0000 mL | INTRAMUSCULAR | Status: DC | PRN

## 2022-06-09 NOTE — Progress Notes (Signed)
Received patient in bed to unit.  Alert and oriented.  Informed consent signed and in chart.   Treatment initiated: 10:18 Treatment completed: 13:51  Patient tolerated well.  Transported back to the room  Alert, without acute distress.  Hand-off given to patient's nurse.   Access used: right AVG Access issues: none  Total UF removed: 4L Medication(s) given: none    06/09/22 1351  Vitals  Temp 98.9 F (37.2 C)  Temp Source Oral  BP (!) 173/92  MAP (mmHg) 115  BP Location Left Arm  BP Method Automatic  Patient Position (if appropriate) Lying  Pulse Rate 79  Pulse Rate Source Monitor  ECG Heart Rate 79  Resp 14  Oxygen Therapy  SpO2 100 %  O2 Device Room Air  Patient Activity (if Appropriate) In bed  During Treatment Monitoring  Blood Flow Rate (mL/min) 300 mL/min  Arterial Pressure (mmHg) -50 mmHg  Venous Pressure (mmHg) 260 mmHg  TMP (mmHg) 26 mmHg  Ultrafiltration Rate (mL/min) 1228 mL/min  Dialysate Flow Rate (mL/min) 300 ml/min  HD Safety Checks Performed Yes  Intra-Hemodialysis Comments Tx completed  Dialysis Fluid Bolus Normal Saline  Post Treatment  Dialyzer Clearance Clear  Duration of HD Treatment -hour(s) 3.5 hour(s)  Liters Processed 64.6  Tolerated HD Treatment Yes  AVG/AVF Arterial Site Held (minutes) 5 minutes  AVG/AVF Venous Site Held (minutes) 5 minutes  Fistula / Graft Right Forearm  No placement date or time found.   Placed prior to admission: Yes  Orientation: Right  Access Location: Forearm  Status Deaccessed    Josee Speece S [facility]

## 2022-06-09 NOTE — ED Provider Triage Note (Signed)
Emergency Medicine Provider Triage Evaluation Note  [name] , a 52 y.o. male  was evaluated in triage.  Pt complains of abdominal pain.  Denies nausea/vomiting/diarrhea.  States he missed 4 dialysis sessions recently-- states he overslept for one day and then it was too cold to go outside the other days.    Review of Systems  Positive: Abdominal pain, missed HD Negative: fever  Physical Exam  BP (!) 182/95   Pulse 77   Temp 98.8 F (37.1 C)   Resp 16   SpO2 93%   Gen:   Awake, no distress   Resp:  Normal effort  MSK:   Moves extremities without difficulty  Other:  Sleeping in triage, awoken for exam,  NAD noted  Medical Decision Making  Medically screening exam initiated at 12:52 AM.  Appropriate orders placed.  NIKHOLAS GEFFRE was informed that the remainder of the evaluation will be completed by another provider, this initial triage assessment does not replace that evaluation, and the importance of remaining in the ED until their evaluation is complete.  Abdominal pain, missed HD.  States he missed 4 dialysis sessions total.  Denies N/V/D.  No chest pain.  EKG, labs, CXR.   Larene Pickett, PA-C 06/09/22 [phone]

## 2022-06-09 NOTE — Discharge Instructions (Signed)
Heavy dialysis sessions as scheduled.  Return to the ED for new or concerning symptoms.

## 2022-06-09 NOTE — ED Provider Notes (Addendum)
[facility] Provider Note   CSN: [id number] Arrival date & time: 06/09/22  1275     History  Chief Complaint  Patient presents with   Abdominal Pain    [name] is a 52 y.o. male.   Abdominal Pain    Patient with medical history of CHF, ESRD on dialysis, HTN, HLD, DMT2 presents due to missed dialysis.  Last dialysis session was Thursday to 2 weeks ago.  He is not having generalized abdominal tenderness and feeling short of breath which typically happens when he misses dialysis.  Denies any chest pain or fevers.  Home Medications Prior to Admission medications   Medication Sig Start Date End Date Taking? Authorizing Provider  acetaminophen (TYLENOL) 500 MG tablet Take 1 tablet (500 mg total) by mouth every 8 (eight) hours as needed for mild pain. 05/28/22   Thurnell Lose, MD  amLODipine (NORVASC) 10 MG tablet Take 1 tablet (10 mg total) by mouth daily. 05/26/22   Thurnell Lose, MD  ASPIRIN LOW DOSE 81 MG tablet Take 1 tablet (81 mg total) by mouth daily. 05/28/22   Thurnell Lose, MD  blood glucose meter kit and supplies KIT Dispense based on patient and insurance preference. Use up to four times daily as directed. (FOR ICD-9 250.00, 250.01). For QAC - HS accuchecks. 05/25/22   Thurnell Lose, MD  blood glucose meter kit and supplies KIT Dispense based on patient and insurance preference. Use up to four times daily as directed. (FOR ICD-9 250.00, 250.01). For QAC - HS accuchecks. 05/28/22   Thurnell Lose, MD  calcium acetate (PHOSLO) 667 MG capsule Take 3 capsules (2,001 mg total) by mouth 3 (three) times daily. 05/28/22   Thurnell Lose, MD  carvedilol (COREG) 6.25 MG tablet Take 1 tablet (6.25 mg total) by mouth 2 (two) times daily with a meal. 05/28/22   Thurnell Lose, MD  Fingerstix Lancets MISC Use as directed up to 4 times daily. 05/28/22   Thurnell Lose, MD  glucose blood test strip Use to check fasting blood  sugar once daily. 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 05/28/22   Thurnell Lose, MD  Insulin Pen Needle 32G X 4 MM MISC Use with insulin pens 05/28/22   Thurnell Lose, MD  Insulin Syringe-Needle U-100 25G X 1" 1 ML MISC For 4 times a day insulin SQ, 1 month supply. Diagnosis E11.65 05/28/22   Thurnell Lose, MD  isosorbide-hydrALAZINE (BIDIL) 20-37.5 MG tablet Take 1 tablet by mouth 3 (three) times daily. 05/28/22   Thurnell Lose, MD  PROTONIX 40 MG tablet Take 1 tablet (40 mg total) by mouth daily. 05/25/22   Thurnell Lose, MD  sevelamer carbonate (RENVELA) 800 MG tablet Take 2 tablets (1,600 mg total) by mouth 3 (three) times daily with meals. May also take 1 tablet (800 mg total) 2 (two) times daily as needed with snacks. 05/28/22   Thurnell Lose, MD  sitaGLIPtin (JANUVIA) 25 MG tablet take 1 tablet by mouth daily Patient not taking: Reported on 05/20/2022 02/27/22  sodium bicarbonate 650 MG tablet Take 650 mg by mouth daily. Patient not taking: Reported on 05/20/2022 03/19/22   [provider]  triamcinolone cream (KENALOG) 0.1 % Apply 1 Application topically 2 (two) times daily. Patient not taking: Reported on 05/20/2022 01/06/22   Nyoka Lint, PA-C  gabapentin (NEURONTIN) 300 MG capsule Take 1 capsule (300 mg total) by mouth 3 (three) times daily. Patient not taking: Reported on 07/27/2019 07/29/18 08/30/19  [name], FNP  sildenafil (VIAGRA) 25 MG tablet Take 1 tablet (25 mg total) by mouth as needed for erectile dysfunction. for erectile dysfunction. Patient not taking: Reported on 08/11/2020 10/13/19 11/27/20  [name], FNP      Allergies    Sulfa antibiotics,  Keflex [cephalexin], Other, and Bactrim [sulfamethoxazole-trimethoprim]    Review of Systems   Review of Systems  Gastrointestinal:  Positive for abdominal pain.    Physical Exam Updated Vital Signs BP (!) 163/89   Pulse 72   Temp 97.8 F (36.6 C)   Resp 16   SpO2 100%  Physical Exam Vitals and nursing note reviewed. Exam conducted with a chaperone present.  Constitutional:      Appearance: Normal appearance.  HENT:     Head: Normocephalic and atraumatic.  Eyes:     General: No scleral icterus.       Right eye: No discharge.        Left eye: No discharge.     Extraocular Movements: Extraocular movements intact.     Pupils: Pupils are equal, round, and reactive to light.  Cardiovascular:     Rate and Rhythm: Normal rate and regular rhythm.     Pulses: Normal pulses.     Heart sounds: Normal heart sounds. No murmur heard.    No friction rub. No gallop.  Pulmonary:     Effort: Pulmonary effort is normal. No respiratory distress.     Breath sounds: Normal breath sounds.  Abdominal:     General: Abdomen is flat. Bowel sounds are normal. There is no distension.     Palpations: Abdomen is soft.     Tenderness: There is no abdominal tenderness.     Comments: Abdomen is soft, not TTP  Musculoskeletal:     Comments: Trace LE edema   Skin:    General: Skin is warm and dry.     Coloration: Skin is not jaundiced.     Comments: AV fistula RUE with palpable thrill  Neurological:     Mental Status: He is alert. Mental status is at baseline.     Coordination: Coordination normal.     ED Results / Procedures / Treatments   Labs (all labs ordered are listed, but only abnormal results are displayed) Labs Reviewed  CBC WITH DIFFERENTIAL/PLATELET - Abnormal; Notable for the following components:      Result Value   RBC 3.61 (*)    Hemoglobin 10.4 (*)    HCT 33.5 (*)    RDW 17.0 (*)    nRBC 0.3 (*)    All other components within normal limits  COMPREHENSIVE METABOLIC PANEL -  Abnormal; Notable for the following components:   Potassium 5.8 (*)    CO2 16 (*)    Glucose, Bld 132 (*)    BUN 105 (*)    Creatinine, Ser 15.07 (*)    Calcium 7.9 (*)    Albumin 3.0 (*)    Alkaline Phosphatase 241 (*)    GFR, Estimated 4 (*)    Anion gap 17 (*)  All other components within normal limits  LIPASE, BLOOD  HEPATITIS B SURFACE ANTIGEN  HEPATITIS B SURFACE ANTIBODY, QUANTITATIVE    EKG EKG Interpretation  Date/Time:  Sunday June 09 2022 01:04:25 EST Ventricular Rate:  76 PR Interval:  148 QRS Duration: 144 QT Interval:  456 QTC Calculation: 513 R Axis:   166 Text Interpretation: Normal sinus rhythm Non-specific intra-ventricular conduction block Possible Anterolateral infarct , age undetermined Abnormal ECG When compared with ECG of 20-May-2022 04:14, PREVIOUS ECG IS PRESENT Confirmed by Octaviano Glow [phone]) on 06/09/2022 7:30:21 AM  Radiology DG Chest 2 View  Result Date: 06/09/2022 CLINICAL DATA:  Shortness of breath.  ESRD. EXAM: CHEST - 2 VIEW COMPARISON:  Chest radiograph dated 05/21/2022. FINDINGS: Cardiomegaly with vascular congestion and probable mild edema. Significant interval improvement in the right middle and left lung base densities compared to prior radiograph. No pleural effusion or pneumothorax. No acute osseous pathology. IMPRESSION: 1. Cardiomegaly with vascular congestion and probable mild edema. 2. Improved densities in the right middle lobe and left lung base compared to prior radiograph. Continued follow-up recommended. 3. Electronically Signed   By: Anner Crete M.D.   On: 06/09/2022 02:51    Procedures Procedures    Medications Ordered in ED Medications  Chlorhexidine Gluconate Cloth 2 % PADS 6 each (has no administration in time range)  sodium zirconium cyclosilicate (LOKELMA) packet 10 g (10 g Oral Given 06/09/22 0813)    ED Course/ Medical Decision Making/ A&P                             Medical Decision  Making Risk Prescription drug management.   This is a 52 year old male presenting today due to missed dialysis for 2 weeks.  Differential includes but not limited to electrolyte derangement, hypovolemia, CHF exacerbation, uremia, metabolic derangement.  Patient is overall well-appearing on exam, his abdomen is soft and nontender with palpation.  Lung sounds are clear, he is not hypoxic.  Will order laboratory workup, EKG and chest x-ray then reevaluate.  EKG is without any significant arrhythmia or changes.  No peaked T waves.  Chest x-ray per my interpretation shows some vascular congestion with possible mild edema.  Agree with radiologist.  Ordered, viewed and interpreted laboratory workup.  Notable for hyperkalemia with a potassium of 5.8, anion gap of 17 with low bicarb at 16.  Creatinine is elevated at 15.5.  CBC without leukocytosis, chronic anemia secondary to end-stage renal disease.  Lipase within normal limits.  Given vascular congestion, trace edema and potassium of 5.8 I think patient meets indications for dialysis.  I consulted with nephrology and spoke with Dr. Marval Regal who agrees, requesting 10 g of Lokelma.  They will put patient on list for dialysis.  After dialysis suspect patient will be appropriate for discharge back into the community.  I reevaluated patient after dialysis session, complications.  Patient states he feels improved.  Stable for discharge at this time.     Final Clinical Impression(s) / ED Diagnoses Final diagnoses:  Dialysis patient, noncompliant  Hyperkalemia    Rx / DC Orders ED Discharge Orders     None         Sherrill Raring, Hershal Coria 06/09/22 [id number]    Wyvonnia Dusky, MD 06/09/22 1218    Sherrill Raring, PA-C 06/09/22 1442    Wyvonnia Dusky, MD 06/09/22 [phone]

## 2022-06-09 NOTE — ED Notes (Signed)
Pt placed on 5 lead cardiac monitor on box in hallway.  No new orders at this time.

## 2022-06-09 NOTE — Procedures (Signed)
I was present at this dialysis session. I have reviewed the session itself and made appropriate changes.   Vital signs in last 24 hours:  Temp:  [97.8 F (36.6 C)-98.8 F (37.1 C)] 98.2 F (36.8 C) (01/21 1007) Pulse Rate:  [71-84] 74 (01/21 1030) Resp:  [16-18] 18 (01/21 1030) BP: (154-182)/(70-97) 170/91 (01/21 1030) SpO2:  [91 %-100 %] 98 % (01/21 1030) Weight change:  Filed Weights    Recent Labs  Lab 06/09/22 0100  NA 137  K 5.8*  CL 104  CO2 16*  GLUCOSE 132*  BUN 105*  CREATININE 15.07*  CALCIUM 7.9*    Recent Labs  Lab 06/09/22 0100  WBC 7.1  NEUTROABS 4.9  HGB 10.4*  HCT 33.5*  MCV 92.8  PLT 320    Scheduled Meds:  Chlorhexidine Gluconate Cloth  6 each Topical Q0600   Continuous Infusions:  anticoagulant sodium citrate     PRN Meds:.alteplase, anticoagulant sodium citrate, heparin, heparin, lidocaine (PF), lidocaine-prilocaine, pentafluoroprop-tetrafluoroeth   Donetta Potts,  MD 06/09/2022, 10:58 AM

## 2022-06-09 NOTE — Progress Notes (Addendum)
[facility] Brief Note  [name] 04-20-1971 [id number]  ESRD patient on HD in the ED with abdominal pain after missing his last 4 dialysis.  Per patient he overslept and did not go.  Pertinent findings include hypertension, K 5.8, BUN 105, SCr 15 and CXR with mild pulmonary edema.    OP HD orders: TTS [facility] 4hrs 400/800 EDW 61.5kg 2K 2.5Ca RU AVG Hep 2000 Calcitriol 0.16mcg Mircera 116mcg q2wks last 05/30/22   Plan: Orders written for HD today with decreased time and flow rates due to missing over a week of dialysis.  Patient will return to ED post HD for re-evaluation and likely discharge.  Counseled patient on importance of compliance with prescribed dialysis regimen.   Jen Mow, PA-C [employer]

## 2022-06-09 NOTE — ED Notes (Signed)
Refusing CXR

## 2022-06-09 NOTE — ED Triage Notes (Signed)
Pt BIB [facility] for evaluation of generalized abdominal pain. States missing dialysis x4.

## 2022-06-12 ENCOUNTER — Emergency Department (HOSPITAL_COMMUNITY)
Admission: EM | Admit: 2022-06-12 | Discharge: 2022-06-13 | Discharge status: Against Medical Advice | Payer: Medicaid Other | Attending: Student | Admitting: Student

## 2022-06-12 ENCOUNTER — Encounter (HOSPITAL_COMMUNITY): Payer: Self-pay | Admitting: Emergency Medicine

## 2022-06-12 ENCOUNTER — Other Ambulatory Visit: Payer: Self-pay

## 2022-06-12 DIAGNOSIS — Z5321 Procedure and treatment not carried out due to patient leaving prior to being seen by health care provider: Secondary | ICD-10-CM | POA: Insufficient documentation

## 2022-06-12 DIAGNOSIS — R1084 Generalized abdominal pain: Secondary | ICD-10-CM | POA: Insufficient documentation

## 2022-06-12 NOTE — ED Triage Notes (Addendum)
Pt bib [facility] c/o 10/10 generalized abdominal pain starting yesterday. Pt also endorses diarrhea. T, Th, S dialysis - missed yesterdays session.   190/92, HR 84, RR 16

## 2022-06-12 NOTE — ED Provider Triage Note (Signed)
Emergency Medicine Provider Triage Evaluation Note  BRIGHTEN ORNDOFF , a 52 y.o. male  was evaluated in triage.  Pt complains of diffuse abdominal pain.  Missed dialysis yesterday, last dialysis was on the 21st when I saw him here at [facility].  No vomiting, no chest pain..  Review of Systems  Per HPI  Physical Exam  There were no vitals taken for this visit. Gen:   Awake, no distress   Resp:  Normal effort  MSK:   Moves extremities without difficulty  Other:  Abdomen soft  Medical Decision Making  Medically screening exam initiated at 10:41 PM.  Appropriate orders placed.  JAYCUB NOORANI was informed that the remainder of the evaluation will be completed by another provider, this initial triage assessment does not replace that evaluation, and the importance of remaining in the ED until their evaluation is complete.     Sherrill Raring, PA-C 06/12/22 2242

## 2022-06-13 LAB — COMPREHENSIVE METABOLIC PANEL
ALT: 40 U/L (ref 0–44)
AST: 57 U/L — ABNORMAL HIGH (ref 15–41)
Albumin: 3.1 g/dL — ABNORMAL LOW (ref 3.5–5.0)
Alkaline Phosphatase: 260 U/L — ABNORMAL HIGH (ref 38–126)
Anion gap: 16 — ABNORMAL HIGH (ref 5–15)
BUN: 83 mg/dL — ABNORMAL HIGH (ref 6–20)
CO2: 18 mmol/L — ABNORMAL LOW (ref 22–32)
Calcium: 7.7 mg/dL — ABNORMAL LOW (ref 8.9–10.3)
Chloride: 100 mmol/L (ref 98–111)
Creatinine, Ser: 12.52 mg/dL — ABNORMAL HIGH (ref 0.61–1.24)
GFR, Estimated: 4 mL/min — ABNORMAL LOW (ref >60)
Glucose, Bld: 144 mg/dL — ABNORMAL HIGH (ref 70–99)
Potassium: 5.9 mmol/L — ABNORMAL HIGH (ref 3.5–5.1)
Sodium: 134 mmol/L — ABNORMAL LOW (ref 135–145)
Total Bilirubin: 0.6 mg/dL (ref 0.3–1.2)
Total Protein: 7.1 g/dL (ref 6.5–8.1)

## 2022-06-13 LAB — CBC WITH DIFFERENTIAL/PLATELET
Abs Immature Granulocytes: 0.01 10*3/uL (ref 0.00–0.07)
Basophils Absolute: 0 10*3/uL (ref 0.0–0.1)
Basophils Relative: 0 %
Eosinophils Absolute: 0.4 10*3/uL (ref 0.0–0.5)
Eosinophils Relative: 6 %
HCT: 34.2 % — ABNORMAL LOW (ref 39.0–52.0)
Hemoglobin: 10.8 g/dL — ABNORMAL LOW (ref 13.0–17.0)
Immature Granulocytes: 0 %
Lymphocytes Relative: 16 %
Lymphs Abs: 1.1 10*3/uL (ref 0.7–4.0)
MCH: 29.1 pg (ref 26.0–34.0)
MCHC: 31.6 g/dL (ref 30.0–36.0)
MCV: 92.2 fL (ref 80.0–100.0)
Monocytes Absolute: 0.8 10*3/uL (ref 0.1–1.0)
Monocytes Relative: 12 %
Neutro Abs: 4.2 10*3/uL (ref 1.7–7.7)
Neutrophils Relative %: 66 %
Platelets: 308 10*3/uL (ref 150–400)
RBC: 3.71 MIL/uL — ABNORMAL LOW (ref 4.22–5.81)
RDW: 16.7 % — ABNORMAL HIGH (ref 11.5–15.5)
WBC: 6.4 10*3/uL (ref 4.0–10.5)
nRBC: 0 % (ref 0.0–0.2)

## 2022-06-13 NOTE — ED Notes (Signed)
Pt left AMA °

## 2022-06-17 ENCOUNTER — Emergency Department (HOSPITAL_COMMUNITY)
Admission: EM | Admit: 2022-06-17 | Discharge: 2022-06-18 | Discharge status: Against Medical Advice | Payer: Medicaid Other | Attending: Emergency Medicine | Admitting: Emergency Medicine

## 2022-06-17 ENCOUNTER — Emergency Department (HOSPITAL_COMMUNITY): Payer: Medicaid Other

## 2022-06-17 ENCOUNTER — Other Ambulatory Visit: Payer: Self-pay

## 2022-06-17 DIAGNOSIS — R197 Diarrhea, unspecified: Secondary | ICD-10-CM | POA: Diagnosis not present

## 2022-06-17 DIAGNOSIS — Z5321 Procedure and treatment not carried out due to patient leaving prior to being seen by health care provider: Secondary | ICD-10-CM | POA: Diagnosis not present

## 2022-06-17 DIAGNOSIS — R0602 Shortness of breath: Secondary | ICD-10-CM | POA: Diagnosis present

## 2022-06-17 DIAGNOSIS — Z992 Dependence on renal dialysis: Secondary | ICD-10-CM | POA: Diagnosis not present

## 2022-06-17 LAB — CBC WITH DIFFERENTIAL/PLATELET
Abs Immature Granulocytes: 0.02 10*3/uL (ref 0.00–0.07)
Basophils Absolute: 0 10*3/uL (ref 0.0–0.1)
Basophils Relative: 1 %
Eosinophils Absolute: 0.1 10*3/uL (ref 0.0–0.5)
Eosinophils Relative: 2 %
HCT: 35.6 % — ABNORMAL LOW (ref 39.0–52.0)
Hemoglobin: 10.9 g/dL — ABNORMAL LOW (ref 13.0–17.0)
Immature Granulocytes: 0 %
Lymphocytes Relative: 16 %
Lymphs Abs: 0.8 10*3/uL (ref 0.7–4.0)
MCH: 28.2 pg (ref 26.0–34.0)
MCHC: 30.6 g/dL (ref 30.0–36.0)
MCV: 92 fL (ref 80.0–100.0)
Monocytes Absolute: 0.8 10*3/uL (ref 0.1–1.0)
Monocytes Relative: 16 %
Neutro Abs: 3.1 10*3/uL (ref 1.7–7.7)
Neutrophils Relative %: 65 %
Platelets: 286 10*3/uL (ref 150–400)
RBC: 3.87 MIL/uL — ABNORMAL LOW (ref 4.22–5.81)
RDW: 16.6 % — ABNORMAL HIGH (ref 11.5–15.5)
WBC: 4.8 10*3/uL (ref 4.0–10.5)
nRBC: 0 % (ref 0.0–0.2)

## 2022-06-17 LAB — BASIC METABOLIC PANEL
Anion gap: 15 (ref 5–15)
BUN: 51 mg/dL — ABNORMAL HIGH (ref 6–20)
CO2: 22 mmol/L (ref 22–32)
Calcium: 7.9 mg/dL — ABNORMAL LOW (ref 8.9–10.3)
Chloride: 95 mmol/L — ABNORMAL LOW (ref 98–111)
Creatinine, Ser: 10.58 mg/dL — ABNORMAL HIGH (ref 0.61–1.24)
GFR, Estimated: 5 mL/min — ABNORMAL LOW (ref >60)
Glucose, Bld: 129 mg/dL — ABNORMAL HIGH (ref 70–99)
Potassium: 4.5 mmol/L (ref 3.5–5.1)
Sodium: 132 mmol/L — ABNORMAL LOW (ref 135–145)

## 2022-06-17 LAB — I-STAT CHEM 8, ED
BUN: 55 mg/dL — ABNORMAL HIGH (ref 6–20)
Calcium, Ion: 0.77 mmol/L — CL (ref 1.15–1.40)
Chloride: 102 mmol/L (ref 98–111)
Creatinine, Ser: 12.3 mg/dL — ABNORMAL HIGH (ref 0.61–1.24)
Glucose, Bld: 126 mg/dL — ABNORMAL HIGH (ref 70–99)
HCT: 38 % — ABNORMAL LOW (ref 39.0–52.0)
Hemoglobin: 12.9 g/dL — ABNORMAL LOW (ref 13.0–17.0)
Potassium: 4.5 mmol/L (ref 3.5–5.1)
Sodium: 130 mmol/L — ABNORMAL LOW (ref 135–145)
TCO2: 21 mmol/L — ABNORMAL LOW (ref 22–32)

## 2022-06-17 MED ORDER — CALCIUM GLUCONATE-NACL 1-0.675 GM/50ML-% IV SOLN: 1.0000 g | Freq: Once | INTRAVENOUS | Status: DC

## 2022-06-17 MED ORDER — INSULIN ASPART 100 UNIT/ML IV SOLN: 5.0000 [IU] | Freq: Once | INTRAVENOUS | Status: DC

## 2022-06-17 MED ORDER — CALCIUM GLUCONATE 10 % IV SOLN: 1.0000 g | Freq: Once | INTRAVENOUS | Status: DC

## 2022-06-17 MED ORDER — DEXTROSE 50 % IV SOLN: 1.0000 | Freq: Once | INTRAVENOUS | Status: DC

## 2022-06-17 MED ORDER — SODIUM ZIRCONIUM CYCLOSILICATE 10 G PO PACK: 10.0000 g | PACK | Freq: Once | ORAL | Status: DC

## 2022-06-17 NOTE — ED Triage Notes (Signed)
Patient arrived with EMS from home , he did not complete his hemodialysis treatment last Saturday , reports SOB with diarrhea onset yesterday . CBG= 176.

## 2022-06-17 NOTE — ED Provider Triage Note (Signed)
Emergency Medicine Provider Triage Evaluation Note  [name] , a 52 y.o. male  was evaluated in triage.  Pt complains of shortness of breath.  He states that he missed dialysis on Saturday due to the rain.  Only got 30 minutes of his 4-hour session. Refused to provide any further context.  He has had shortness of breath fatigue and dyspnea on exertion progressive.  Denies chest pain  Review of Systems  Positive: Shortness of breath Negative: Chest pain  Physical Exam  BP (!) 155/85 (BP Location: Left Arm)   Pulse 83   Temp 98.7 F (37.1 C) (Oral)   Resp 16   SpO2 92%  Gen:   Awake, no distress   Resp:  Normal effort  MSK:   Moves extremities without difficulty  Other:    Medical Decision Making  Medically screening exam initiated at 10:23 PM.  Appropriate orders placed.  DERIUS GHOSH was informed that the remainder of the evaluation will be completed by another provider, this initial triage assessment does not replace that evaluation, and the importance of remaining in the ED until their evaluation is complete.  I was brought patient's EKG which has peaked T waves.  Hyper-K reversal was ordered.  This is a potentially critically ill patient.  Nursing was made aware that patient will need next available bed as soon as possible.   Tretha Sciara, MD 06/17/22 2225

## 2022-06-18 ENCOUNTER — Other Ambulatory Visit: Payer: Self-pay

## 2022-06-18 MED ORDER — AMLODIPINE BESYLATE 10 MG PO TABS
10.0000 mg | ORAL_TABLET | Freq: Every day | ORAL | 3 refills | Status: DC
  Filled 2022-06-18 – 2022-07-15 (×2): qty 30, 30d supply, fill #0

## 2022-06-18 MED ORDER — SITAGLIPTIN PHOSPHATE 25 MG PO TABS
25.0000 mg | ORAL_TABLET | Freq: Every day | ORAL | 3 refills | Status: DC
  Filled 2022-06-18 – 2022-07-02 (×3): qty 30, 30d supply, fill #0

## 2022-06-18 MED ORDER — INSULIN DETEMIR 100 UNIT/ML ~~LOC~~ SOLN
35.0000 [IU] | Freq: Every day | SUBCUTANEOUS | 6 refills | Status: DC
  Filled 2022-06-18 – 2022-06-19 (×2): qty 10, 28d supply, fill #0

## 2022-06-18 MED ORDER — AMLODIPINE BESYLATE 10 MG PO TABS: 10.0000 mg | ORAL_TABLET | Freq: Every day | ORAL | 3 refills | Status: DC

## 2022-06-18 NOTE — ED Notes (Signed)
Pt not responding for vital call

## 2022-06-19 ENCOUNTER — Other Ambulatory Visit: Payer: Self-pay

## 2022-06-19 MED ORDER — LANTUS SOLOSTAR 100 UNIT/ML ~~LOC~~ SOPN
35.0000 [IU] | PEN_INJECTOR | Freq: Every day | SUBCUTANEOUS | 6 refills | Status: DC
  Filled 2022-06-19: qty 15, 15d supply, fill #0
  Filled 2022-06-19: qty 15, 30d supply, fill #0
  Filled 2022-06-19: qty 15, 42d supply, fill #0
  Filled 2022-06-19: qty 15, 30d supply, fill #0

## 2022-06-19 MED ORDER — BASAGLAR KWIKPEN 100 UNIT/ML ~~LOC~~ SOPN
35.0000 [IU] | PEN_INJECTOR | SUBCUTANEOUS | 6 refills | Status: DC
  Filled 2022-06-19: qty 15, 15d supply, fill #0

## 2022-06-25 ENCOUNTER — Other Ambulatory Visit: Payer: Self-pay

## 2022-06-26 ENCOUNTER — Other Ambulatory Visit: Payer: Self-pay

## 2022-06-28 ENCOUNTER — Other Ambulatory Visit: Payer: Self-pay

## 2022-06-29 ENCOUNTER — Encounter (HOSPITAL_COMMUNITY): Payer: Self-pay

## 2022-06-29 ENCOUNTER — Emergency Department (HOSPITAL_COMMUNITY): Payer: Commercial Managed Care - HMO

## 2022-06-29 ENCOUNTER — Emergency Department (HOSPITAL_COMMUNITY)
Admission: EM | Admit: 2022-06-29 | Discharge: 2022-06-29 | Disposition: A | Discharge status: Home / Self Care | Payer: Commercial Managed Care - HMO | Attending: Emergency Medicine | Admitting: Emergency Medicine

## 2022-06-29 ENCOUNTER — Other Ambulatory Visit: Payer: Self-pay

## 2022-06-29 DIAGNOSIS — W06XXXA Fall from bed, initial encounter: Secondary | ICD-10-CM | POA: Diagnosis not present

## 2022-06-29 DIAGNOSIS — Z794 Long term (current) use of insulin: Secondary | ICD-10-CM | POA: Diagnosis not present

## 2022-06-29 DIAGNOSIS — M25512 Pain in left shoulder: Secondary | ICD-10-CM | POA: Diagnosis present

## 2022-06-29 DIAGNOSIS — S42002A Fracture of unspecified part of left clavicle, initial encounter for closed fracture: Secondary | ICD-10-CM | POA: Diagnosis not present

## 2022-06-29 MED ORDER — OXYCODONE HCL 5 MG PO TABS
5.0000 mg | ORAL_TABLET | Freq: Once | ORAL | Status: AC
  Administered 2022-06-29: 5 mg via ORAL
  Filled 2022-06-29: qty 1

## 2022-06-29 NOTE — Discharge Instructions (Addendum)
You have a fracture of your left clavicle, this should heal on its own.  Wear the sling, expect to take 4 to 6 weeks to get stronger.  Follow-up with orthopedics.  [facility]) Monday - Friday 8am - 3pm          Sat & Sun 8am - 2pm [address] [place], [place] [address]   [phone]     [email or web address] [facility] offers among other critical resources: showers, laundry, barbershop, phone bank, mailroom, computer lab, medical clinic, gardens and a bike maintenance area.   [place]  (Men & women) [address] ([place] (Men/women/families) [address] [place] [phone]   [facility] (Families with children) [phone] [address] [place] ([place] ([place]) [place] [phone]   [facility] (Children ages 16-17) [address] [phone]   [facility]   (Women & children) [address] [place] [phone]   [facility] (Women/substance abuse) [place].  [place] [phone]   [facility] (Men) [address].  [place] [phone]   [facility] (Men) [address] [phone]  [employer] (Women) [place]  [place] [phone]   [facility] (Single women & women with children) [address]  [place]le[place] [phone]  [facility] (Men/women/families) [address] [phone]    [facility]   (Domestic Violence shelter) [place] [phone]   [place] (Men & women) [address]  [place] ([place] (Men) [address]  [employer] ([phone] [place] (Men) [address] [place]  [phone]   [facility] (Single women & families) [address] [phone]  [facility] (Men/women & families) [place].  [place] [phone]    If you are at risk of losing your housing (throughout [facility]) call the [place] at [phone]. You may also contact 2-1-1, a FREE service of the [place] [address] that provides information about many resources including housing. Dial 211, or visit online at CustodianSupply.fi. [place], contact Margretta Ditty [phone] (men only)                          Natale Lay in [place]:  28 day homeless program for women and men;                             contact Rev. Chambers [place]:  men/women/children [place] in [place], [place] [phone]                       [facility] in [place], [place]   [facility]:  [place] for Abused Women, [phone]; (women and children)  [facility]:  [place], men/women/children; [place] in [place], [place]; substance abuse halfway house for men  [facility]; 4 bedroom house in [facility] for homeless women, contact [place]               [facility] in [place], [phone] (women and children)               [facility], for abused women and children, 24 hour crisis line, [phone], [place], halfway house for women, [place], [place]:  [facility], [phone]; open Mon-Thurs from V7051580 - March 15 when temp is below 32 degrees                              [facility]; [place], [phone]; cell [phone]; open 24/7  [facility]:  [facility] for [place] - [phone]  [place]:  transitional housing for women and children; Jeanie Cooks [phone]

## 2022-06-29 NOTE — ED Provider Notes (Addendum)
[facility] Provider Note   CSN: [id number] Arrival date & time: 06/29/22  1304     History  Chief Complaint  Patient presents with   Arm Injury    [name] is a 52 y.o. male.   Arm Injury    This is a 52 year old male presenting to the emergency department due to left shoulder pain.  This happened after rolling out of bed last night and landing on his left side.  He denies hitting his head or losing consciousness, not having any pain elsewhere.  He is not on any blood thinners.  Home Medications Prior to Admission medications   Medication Sig Start Date End Date Taking? Authorizing Provider  acetaminophen (TYLENOL) 500 MG tablet Take 1 tablet (500 mg total) by mouth every 8 (eight) hours as needed for mild pain. 05/28/22   Thurnell Lose, MD  amLODipine (NORVASC) 10 MG tablet Take 1 tablet (10 mg total) by mouth daily. 05/26/22   Thurnell Lose, MD  amLODipine (NORVASC) 10 MG tablet Take 1 tablet (10 mg total) by mouth daily. 06/18/22     amLODipine (NORVASC) 10 MG tablet Take 1 tablet (10 mg total) by mouth daily. 06/18/22     ASPIRIN LOW DOSE 81 MG tablet Take 1 tablet (81 mg total) by mouth daily. 05/28/22   Thurnell Lose, MD  blood glucose meter kit and supplies KIT Dispense based on patient and insurance preference. Use up to four times daily as directed. (FOR ICD-9 250.00, 250.01). For QAC - HS accuchecks. 05/25/22   Thurnell Lose, MD  blood glucose meter kit and supplies KIT Dispense based on patient and insurance preference. Use up to four times daily as directed. (FOR ICD-9 250.00, 250.01). For QAC - HS accuchecks. 05/28/22   Thurnell Lose, MD  calcium acetate (PHOSLO) 667 MG capsule Take 3 capsules (2,001 mg total) by mouth 3 (three) times daily. 05/28/22   Thurnell Lose, MD  carvedilol (COREG) 6.25 MG tablet Take 1 tablet (6.25 mg total) by mouth 2 (two) times daily with a meal. 05/28/22   Thurnell Lose, MD   Fingerstix Lancets MISC Use as directed up to 4 times daily. 05/28/22   Thurnell Lose, MD  glucose blood test strip Use to check fasting blood sugar once daily. 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 05/28/22   Thurnell Lose, MD  insulin detemir (LEVEMIR) 100 UNIT/ML injection Inject 0.35 mLs (35 Units total) into the skin daily. max daily dose 100 units per day 06/18/22     Insulin Glargine (BASAGLAR KWIKPEN) 100 UNIT/ML Inject 35 Units into the skin daily. Do not exceed 100 units per day. 06/19/22     insulin glargine (LANTUS SOLOSTAR) 100 UNIT/ML Solostar Pen Inject 35 Units into the skin daily.Not to exceed 100 units per day. 06/19/22     Insulin Pen Needle 32G X 4 MM MISC Use with insulin pens 05/28/22   Thurnell Lose, MD  Insulin Syringe-Needle U-100 25G X 1" 1 ML MISC For 4 times a day insulin SQ, 1 month supply. Diagnosis  E11.65 05/28/22   Thurnell Lose, MD  isosorbide-hydrALAZINE (BIDIL) 20-37.5 MG tablet Take 1 tablet by mouth 3 (three) times daily. 05/28/22   Thurnell Lose, MD  PROTONIX 40 MG tablet Take 1 tablet (40 mg total) by mouth daily. 05/25/22   Thurnell Lose, MD  sevelamer carbonate (RENVELA) 800 MG tablet Take 2 tablets (1,600 mg total) by mouth 3 (three) times daily with meals. May also take 1 tablet (800 mg total) 2 (two) times daily as needed with snacks. 05/28/22   Thurnell Lose, MD  sitaGLIPtin (JANUVIA) 25 MG tablet take 1 tablet by mouth daily Patient not taking: Reported on 05/20/2022 02/27/22     sitaGLIPtin (JANUVIA) 25 MG tablet Take 1 tablet (25 mg total) by mouth. 06/18/22     sodium bicarbonate 650 MG tablet Take 650 mg by mouth daily. Patient not taking:  Reported on 05/20/2022 03/19/22   [provider]  triamcinolone cream (KENALOG) 0.1 % Apply 1 Application topically 2 (two) times daily. Patient not taking: Reported on 05/20/2022 01/06/22   Nyoka Lint, PA-C  gabapentin (NEURONTIN) 300 MG capsule Take 1 capsule (300 mg total) by mouth 3 (three) times daily. Patient not taking: Reported on 07/27/2019 07/29/18 08/30/19  [name], FNP  sildenafil (VIAGRA) 25 MG tablet Take 1 tablet (25 mg total) by mouth as needed for erectile dysfunction. for erectile dysfunction. Patient not taking: Reported on 08/11/2020 10/13/19 11/27/20  [name], FNP      Allergies    Sulfa antibiotics, Keflex [cephalexin], Other, and Bactrim [sulfamethoxazole-trimethoprim]    Review of Systems   Review of Systems  Physical Exam Updated Vital Signs BP (!) 206/103 (BP Location: Left Wrist)   Pulse 88   Temp 97.7 F (36.5 C) (Oral)   Resp 18   Ht 5' 7"$  (1.702 m)   Wt 61.7 kg   SpO2 93%   BMI 21.30 kg/m  Physical Exam Vitals and nursing note reviewed. Exam conducted with a chaperone present.  Constitutional:      Appearance: Normal appearance.  HENT:     Head: Normocephalic and atraumatic.  Eyes:     General: No scleral icterus.       Right eye: No discharge.        Left eye: No discharge.     Extraocular Movements: Extraocular movements intact.     Pupils: Pupils are equal, round, and reactive to light.  Cardiovascular:     Rate and Rhythm: Normal rate and regular rhythm.     Pulses: Normal pulses.     Heart sounds: Normal heart sounds.     No friction rub. No gallop.  Pulmonary:     Effort: Pulmonary effort is normal. No respiratory distress.     Breath sounds: Normal breath sounds.  Abdominal:     General: Abdomen is flat. Bowel sounds are normal. There is no distension.     Palpations: Abdomen is soft.     Tenderness: There is no abdominal tenderness.  Musculoskeletal:        General: Tenderness present.        Arms:  Skin:    General: Skin is warm and dry.     Capillary Refill: Capillary refill takes less than 2 seconds.     Coloration: Skin is not jaundiced.  Neurological:     Mental Status: He is alert. Mental status is at baseline.     Coordination: Coordination normal.     ED Results / Procedures /  Treatments   Labs (all labs ordered are listed, but only abnormal results are displayed) Labs Reviewed - No data to display  EKG None  Radiology DG Shoulder Left  Result Date: 06/29/2022 CLINICAL DATA:  [place] out of bed last night.  Left arm pain. EXAM: LEFT SHOULDER - 2+ VIEW COMPARISON:  None Available. FINDINGS: Nondisplaced, non comminuted oblique fracture of the distal left clavicle. No other fractures.  No bone lesions. Glenohumeral and AC joints are normally spaced and aligned. Small marginal spurs project from the inferior glenoid and humeral head. Soft tissue edema overlies the fracture distal left clavicle. IMPRESSION: 1. Nondisplaced, non comminuted oblique fracture of the distal left clavicle. No dislocation. Electronically Signed   By: Lajean Manes M.D.   On: 06/29/2022 13:34    Procedures Procedures    Medications Ordered in ED Medications  oxyCODONE (Oxy IR/ROXICODONE) immediate release tablet 5 mg (has no administration in time range)    ED Course/ Medical Decision Making/ A&P                             Medical Decision Making Amount and/or Complexity of Data Reviewed Radiology: ordered.  Risk Prescription drug management.   This is a 52 year old male presenting for fall.  He has tenderness over the left clavicle, otherwise physical exam is unremarkable.  He is neurovascular intact with brisk cap refill, radial pulse 2+.  Plain films ordered in triage reviewed by myself, agree with radiologist that he has a nondisplaced clavicular fracture.  Will put in sling for immobilization, have him follow-up with orthopedic surgery.  She has indications for emergent  orthopedic consult here in the ED, stable for outpatient follow-up.        Final Clinical Impression(s) / ED Diagnoses Final diagnoses:  None    Rx / DC Orders ED Discharge Orders     None         Sherrill Raring, PA-C 06/29/22 1403    Sherrill Raring, PA-C 06/29/22 1442    Wyvonnia Dusky, MD 06/29/22 [id number]

## 2022-06-29 NOTE — Care Management (Signed)
This is the patients 49th visit in 6 months. He has come in for a variety of issues, abdominal pain, frequently missing dialysis. He has missed dialysis x1 week but apparently fell out of bed and now has a fractured clavicle. Patient does have a PCP.  He lists his residence as the [facility]. Homeless resources placed on chart

## 2022-06-29 NOTE — ED Notes (Signed)
Left shoulder pain following fall from bed last night. Distal CMS intact.

## 2022-06-29 NOTE — ED Triage Notes (Signed)
Complains of left arm pain after falling out of bed last night.  Able to move it but it hurts

## 2022-07-02 ENCOUNTER — Other Ambulatory Visit: Payer: Self-pay

## 2022-07-03 ENCOUNTER — Other Ambulatory Visit: Payer: Self-pay

## 2022-07-04 ENCOUNTER — Encounter (HOSPITAL_COMMUNITY): Payer: Self-pay

## 2022-07-04 ENCOUNTER — Emergency Department (HOSPITAL_COMMUNITY): Payer: Commercial Managed Care - HMO

## 2022-07-04 ENCOUNTER — Other Ambulatory Visit: Payer: Self-pay

## 2022-07-04 ENCOUNTER — Emergency Department (HOSPITAL_COMMUNITY)
Admission: EM | Admit: 2022-07-04 | Discharge: 2022-07-05 | Disposition: A | Discharge status: Home / Self Care | Payer: Commercial Managed Care - HMO | Attending: Emergency Medicine | Admitting: Emergency Medicine

## 2022-07-04 DIAGNOSIS — Y92002 Bathroom of unspecified non-institutional (private) residence single-family (private) house as the place of occurrence of the external cause: Secondary | ICD-10-CM | POA: Diagnosis not present

## 2022-07-04 DIAGNOSIS — S80211A Abrasion, right knee, initial encounter: Secondary | ICD-10-CM | POA: Diagnosis not present

## 2022-07-04 DIAGNOSIS — E875 Hyperkalemia: Secondary | ICD-10-CM

## 2022-07-04 DIAGNOSIS — Z992 Dependence on renal dialysis: Secondary | ICD-10-CM | POA: Diagnosis not present

## 2022-07-04 DIAGNOSIS — Y9301 Activity, walking, marching and hiking: Secondary | ICD-10-CM | POA: Insufficient documentation

## 2022-07-04 DIAGNOSIS — Z7982 Long term (current) use of aspirin: Secondary | ICD-10-CM | POA: Insufficient documentation

## 2022-07-04 DIAGNOSIS — M25512 Pain in left shoulder: Secondary | ICD-10-CM | POA: Diagnosis present

## 2022-07-04 DIAGNOSIS — N186 End stage renal disease: Secondary | ICD-10-CM | POA: Diagnosis not present

## 2022-07-04 DIAGNOSIS — S42035A Nondisplaced fracture of lateral end of left clavicle, initial encounter for closed fracture: Secondary | ICD-10-CM | POA: Insufficient documentation

## 2022-07-04 DIAGNOSIS — W19XXXA Unspecified fall, initial encounter: Secondary | ICD-10-CM

## 2022-07-04 DIAGNOSIS — W010XXA Fall on same level from slipping, tripping and stumbling without subsequent striking against object, initial encounter: Secondary | ICD-10-CM | POA: Diagnosis not present

## 2022-07-04 DIAGNOSIS — J810 Acute pulmonary edema: Secondary | ICD-10-CM | POA: Diagnosis not present

## 2022-07-04 DIAGNOSIS — S42009A Fracture of unspecified part of unspecified clavicle, initial encounter for closed fracture: Secondary | ICD-10-CM

## 2022-07-04 DIAGNOSIS — Z794 Long term (current) use of insulin: Secondary | ICD-10-CM | POA: Insufficient documentation

## 2022-07-04 LAB — I-STAT CHEM 8, ED
BUN: 68 mg/dL — ABNORMAL HIGH (ref 6–20)
Calcium, Ion: 1.04 mmol/L — ABNORMAL LOW (ref 1.15–1.40)
Chloride: 105 mmol/L (ref 98–111)
Creatinine, Ser: 11.4 mg/dL — ABNORMAL HIGH (ref 0.61–1.24)
Glucose, Bld: 99 mg/dL (ref 70–99)
HCT: 45 % (ref 39.0–52.0)
Hemoglobin: 15.3 g/dL (ref 13.0–17.0)
Potassium: 6.3 mmol/L (ref 3.5–5.1)
Sodium: 138 mmol/L (ref 135–145)
TCO2: 25 mmol/L (ref 22–32)

## 2022-07-04 LAB — BASIC METABOLIC PANEL
Anion gap: 13 (ref 5–15)
BUN: 77 mg/dL — ABNORMAL HIGH (ref 6–20)
CO2: 22 mmol/L (ref 22–32)
Calcium: 7.9 mg/dL — ABNORMAL LOW (ref 8.9–10.3)
Chloride: 100 mmol/L (ref 98–111)
Creatinine, Ser: 10.11 mg/dL — ABNORMAL HIGH (ref 0.61–1.24)
GFR, Estimated: 6 mL/min — ABNORMAL LOW (ref >60)
Glucose, Bld: 101 mg/dL — ABNORMAL HIGH (ref 70–99)
Potassium: 6.8 mmol/L (ref 3.5–5.1)
Sodium: 135 mmol/L (ref 135–145)

## 2022-07-04 LAB — CBC
HCT: 40.6 % (ref 39.0–52.0)
Hemoglobin: 12.6 g/dL — ABNORMAL LOW (ref 13.0–17.0)
MCH: 28.1 pg (ref 26.0–34.0)
MCHC: 31 g/dL (ref 30.0–36.0)
MCV: 90.4 fL (ref 80.0–100.0)
Platelets: 359 10*3/uL (ref 150–400)
RBC: 4.49 MIL/uL (ref 4.22–5.81)
RDW: 17.4 % — ABNORMAL HIGH (ref 11.5–15.5)
WBC: 8.1 10*3/uL (ref 4.0–10.5)
nRBC: 0 % (ref 0.0–0.2)

## 2022-07-04 MED ORDER — OXYCODONE-ACETAMINOPHEN 5-325 MG PO TABS
1.0000 | ORAL_TABLET | Freq: Four times a day (QID) | ORAL | 0 refills | Status: DC | PRN
  Filled 2022-07-04: qty 9, 3d supply, fill #0

## 2022-07-04 MED ORDER — SODIUM BICARBONATE 8.4 % IV SOLN
50.0000 meq | Freq: Once | INTRAVENOUS | Status: AC
  Administered 2022-07-04: 50 meq via INTRAVENOUS
  Filled 2022-07-04: qty 50

## 2022-07-04 MED ORDER — DEXTROSE 50 % IV SOLN
1.0000 | Freq: Once | INTRAVENOUS | Status: AC
  Administered 2022-07-04: 50 mL via INTRAVENOUS
  Filled 2022-07-04: qty 50

## 2022-07-04 MED ORDER — HYDROCODONE-ACETAMINOPHEN 5-325 MG PO TABS
1.0000 | ORAL_TABLET | Freq: Once | ORAL | Status: AC
  Administered 2022-07-04: 1 via ORAL
  Filled 2022-07-04: qty 1

## 2022-07-04 MED ORDER — INSULIN ASPART 100 UNIT/ML IV SOLN
5.0000 [IU] | Freq: Once | INTRAVENOUS | Status: AC
  Administered 2022-07-04: 5 [IU] via INTRAVENOUS

## 2022-07-04 MED ORDER — CALCIUM GLUCONATE-NACL 1-0.675 GM/50ML-% IV SOLN
1.0000 g | Freq: Once | INTRAVENOUS | Status: AC
  Administered 2022-07-04: 1000 mg via INTRAVENOUS
  Filled 2022-07-04: qty 50

## 2022-07-04 MED ORDER — DEXTROSE 10 % IV SOLN: Freq: Once | INTRAVENOUS | Status: AC

## 2022-07-04 MED ORDER — OXYCODONE-ACETAMINOPHEN 5-325 MG PO TABS
1.0000 | ORAL_TABLET | Freq: Two times a day (BID) | ORAL | 0 refills | Status: DC | PRN
  Filled 2022-07-04: qty 9, 5d supply, fill #0

## 2022-07-04 MED ORDER — SODIUM ZIRCONIUM CYCLOSILICATE 10 G PO PACK: 10.0000 g | PACK | Freq: Every day | ORAL | Status: DC

## 2022-07-04 NOTE — Discharge Instructions (Addendum)
Make sure you keep the arm in the sling for it to heal properly.

## 2022-07-04 NOTE — ED Notes (Signed)
Patient is not cooperating with blood draw, unable to get sample

## 2022-07-04 NOTE — ED Provider Triage Note (Signed)
Emergency Medicine Provider Triage Evaluation Note  [name] , a 52 y.o. male  was evaluated in triage.  Pt complains of injury to right knee.  He reports this morning while he was walking to the bathroom he fell and scraped his right knee he reports some pain and swelling to the knee.  He reports that he missed dialysis this morning because he overslept..  Patient was seen in the ED on 2/10 for a fall and diagnosed with a left clavicle fracture.  Review of Systems  Positive: R knee pain Negative:   Physical Exam  BP (!) 189/112   Pulse 79   Temp 98.2 F (36.8 C) (Oral)   Resp 16   Ht 5' 7"$  (1.702 m)   Wt 61.7 kg   SpO2 100%   BMI 21.30 kg/m  Gen:   Awake, no distress   Resp:  Normal effort  MSK:   Right lower extremityAbrasion to the right knee with dressing in place, mild anterior swelling, able to flex and extend. Other:    Medical Decision Making  Medically screening exam initiated at 3:03 PM.  Appropriate orders placed.  [name] was informed that the remainder of the evaluation will be completed by another provider, this initial triage assessment does not replace that evaluation, and the importance of remaining in the ED until their evaluation is complete.     Jacqlyn Larsen, [place] 07/04/22 1506

## 2022-07-04 NOTE — ED Provider Notes (Signed)
  Provider Note MRN:  [id number]  Arrival date & time: 07/05/22    ED Course and Medical Decision Making  Assumed care from Dr. Kathrynn Humble at shift change.  Missed dialysis, hyperkalemia with EKG changes, also with some pulmonary edema on chest x-ray.  Providing hyperkalemia treatment and will reach out to nephrology for urgent dialysis.  Spoke with Dr. Marval Regal of nephrology, patient to receive dialysis this evening.  Will return back to the emergency department for repeat evaluation.  .Critical Care  Performed by: Maudie Flakes, MD Authorized by: Maudie Flakes, MD   Critical care provider statement:    Critical care time (minutes):  35   Critical care was necessary to treat or prevent imminent or life-threatening deterioration of the following conditions:  Metabolic crisis (Hyperkalemia)   Critical care was time spent personally by me on the following activities:  Development of treatment plan with patient or surrogate, discussions with consultants, evaluation of patient's response to treatment, examination of patient, ordering and review of laboratory studies, ordering and review of radiographic studies, ordering and performing treatments and interventions, pulse oximetry, re-evaluation of patient's condition and review of old charts   Final Clinical Impressions(s) / ED Diagnoses     ICD-10-CM   1. Fall, initial encounter  W19.XXXA     2. Closed nondisplaced fracture of clavicle, unspecified laterality, unspecified part of clavicle, initial encounter  S42.009A     3. Hyperkalemia  E87.5     4. Acute pulmonary edema ([place])  J81.0           Barth Kirks. [place] Small, [place] [email or web address]    Maudie Flakes, MD 07/05/22 Berniece Salines

## 2022-07-04 NOTE — ED Triage Notes (Signed)
Pt arrived via [facility] from a trip and fall and injured right knee. Pt has a scrape on right knee. Pt dialysis pt and goes Tues, Thurs, Sat. Missed dialysis today. Bp 184 palp Pt wears 4L 02 [place] at baseline

## 2022-07-04 NOTE — ED Notes (Signed)
Patient transported to X-ray 

## 2022-07-04 NOTE — ED Provider Notes (Addendum)
[facility] Provider Note   CSN: [id number] Arrival date & time: 07/04/22  1430     History  Chief Complaint  Patient presents with   [name] is a 52 y.o. male.  HPI     52 year old male comes in with chief complaint of mechanical fall.  Patient states that this morning he was walking to the bathroom, tripped and fell.  He is complaining of pain over his right knee.  Patient also missed his hemodialysis earlier today.  His main complaint however is pain to his right knee and also pain over his clavicle, which he had just broken few days ago.  Home Medications Prior to Admission medications   Medication Sig Start Date End Date Taking? Authorizing Provider  acetaminophen (TYLENOL) 500 MG tablet Take 1 tablet (500 mg total) by mouth every 8 (eight) hours as needed for mild pain. 05/28/22   Thurnell Lose, MD  amLODipine (NORVASC) 10 MG tablet Take 1 tablet (10 mg total) by mouth daily. 05/26/22   Thurnell Lose, MD  amLODipine (NORVASC) 10 MG tablet Take 1 tablet (10 mg total) by mouth daily. 06/18/22     amLODipine (NORVASC) 10 MG tablet Take 1 tablet (10 mg total) by mouth daily. 06/18/22     ASPIRIN LOW DOSE 81 MG tablet Take 1 tablet (81 mg total) by mouth daily. 05/28/22   Thurnell Lose, MD  blood glucose meter kit and supplies KIT Dispense based on patient and insurance preference. Use up to four times daily as directed. (FOR ICD-9 250.00, 250.01). For QAC - HS accuchecks. 05/25/22   Thurnell Lose, MD  blood glucose meter kit and supplies KIT Dispense based on patient and insurance preference. Use up to four times daily as directed. (FOR ICD-9 250.00, 250.01). For QAC - HS accuchecks. 05/28/22   Thurnell Lose, MD  calcium acetate (PHOSLO) 667 MG capsule Take 3 capsules (2,001 mg total) by mouth 3 (three) times daily. 05/28/22   Thurnell Lose, MD  carvedilol (COREG) 6.25 MG tablet Take 1 tablet (6.25 mg total) by  mouth 2 (two) times daily with a meal. 05/28/22   Thurnell Lose, MD  Fingerstix Lancets MISC Use as directed up to 4 times daily. 05/28/22   Thurnell Lose, MD  glucose blood test strip Use to check fasting blood sugar once daily. 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 05/28/22   Thurnell Lose, MD  insulin detemir (LEVEMIR) 100 UNIT/ML injection Inject 0.35 mLs (35 Units total) into the skin daily. max daily dose 100 units per day 06/18/22     Insulin Glargine (BASAGLAR KWIKPEN) 100 UNIT/ML Inject 35 Units into the skin daily. Do not exceed 100 units per day. 06/19/22     insulin glargine (LANTUS SOLOSTAR) 100 UNIT/ML Solostar Pen Inject 35 Units into the skin daily.Not to exceed 100 units per day. 06/19/22     Insulin Pen Needle 32G X 4 MM MISC Use with insulin pens 05/28/22   Thurnell Lose, MD  Insulin Syringe-Needle U-100 25G X 1" 1  ML MISC For 4 times a day insulin SQ, 1 month supply. Diagnosis E11.65 05/28/22   Thurnell Lose, MD  isosorbide-hydrALAZINE (BIDIL) 20-37.5 MG tablet Take 1 tablet by mouth 3 (three) times daily. 05/28/22   Thurnell Lose, MD  oxyCODONE-acetaminophen (PERCOCET/ROXICET) 5-325 MG tablet Take 1 tablet by mouth every 12 (twelve) hours as needed for severe pain. 07/04/22   Varney Biles, MD  PROTONIX 40 MG tablet Take 1 tablet (40 mg total) by mouth daily. 05/25/22   Thurnell Lose, MD  sevelamer carbonate (RENVELA) 800 MG tablet Take 2 tablets (1,600 mg total) by mouth 3 (three) times daily with meals. May also take 1 tablet (800 mg total) 2 (two) times daily as needed with snacks. 05/28/22   Thurnell Lose, MD  sitaGLIPtin (JANUVIA) 25 MG tablet take 1 tablet by mouth  daily Patient not taking: Reported on 05/20/2022 02/27/22     sitaGLIPtin (JANUVIA) 25 MG tablet Take 1 tablet (25 mg total) by mouth daily. 06/18/22     sodium bicarbonate 650 MG tablet Take 650 mg by mouth daily. Patient not taking: Reported on 05/20/2022 03/19/22   [provider]  triamcinolone cream (KENALOG) 0.1 % Apply 1 Application topically 2 (two) times daily. Patient not taking: Reported on 05/20/2022 01/06/22   Nyoka Lint, PA-C  gabapentin (NEURONTIN) 300 MG capsule Take 1 capsule (300 mg total) by mouth 3 (three) times daily. Patient not taking: Reported on 07/27/2019 07/29/18 08/30/19  [name], FNP  sildenafil (VIAGRA) 25 MG tablet Take 1 tablet (25 mg total) by mouth as needed for erectile dysfunction. for erectile dysfunction. Patient not taking: Reported on 08/11/2020 10/13/19 11/27/20  [name], FNP      Allergies    Sulfa antibiotics, Keflex [cephalexin], Other, and Bactrim [sulfamethoxazole-trimethoprim]    Review of Systems   Review of Systems  All other systems reviewed and are negative.   Physical Exam Updated Vital Signs BP (!) 175/98 (BP Location: Left Arm)   Pulse 75   Temp 98.5 F (36.9 C) (Oral)   Resp 18   Ht 5' 7"$  (1.702 m)   Wt 61.7 kg   SpO2 96%   BMI 21.30 kg/m  Physical Exam Vitals and nursing note reviewed.  Constitutional:      Appearance: He is well-developed.  HENT:     Head: Atraumatic.  Cardiovascular:     Rate and Rhythm: Normal rate.  Pulmonary:     Effort: Pulmonary effort is normal.  Musculoskeletal:        General: Tenderness present. No deformity.     Cervical back: Neck supple.     Comments: Patient has abrasion to the right knee, no gross deformity  Skin:    General: Skin is warm.  Neurological:     Mental Status: He is alert and oriented to person, place, and time.     ED Results / Procedures / Treatments   Labs (all labs ordered are listed, but only abnormal results are displayed) Labs Reviewed   BASIC METABOLIC PANEL - Abnormal; Notable for the following components:      Result Value   Potassium 6.8 (*)    Glucose, Bld 101 (*)    BUN 77 (*)    Creatinine, Ser 10.11 (*)    Calcium 7.9 (*)    GFR, Estimated 6 (*)    All other components within normal limits  CBC - Abnormal; Notable for the following components:   Hemoglobin 12.6 (*)  RDW 17.4 (*)    All other components within normal limits  I-STAT CHEM 8, ED - Abnormal; Notable for the following components:   Potassium 6.3 (*)    BUN 68 (*)    Creatinine, Ser 11.40 (*)    Calcium, Ion 1.04 (*)    All other components within normal limits    EKG EKG Interpretation  Date/Time:  Thursday July 04 2022 21:50:44 EST Ventricular Rate:  73 PR Interval:  146 QRS Duration: 148 QT Interval:  470 QTC Calculation: 517 R Axis:   162 Text Interpretation: Normal sinus rhythm Right axis deviation Non-specific intra-ventricular conduction block Abnormal ECG When compared with ECG of 17-Jun-2022 21:55, PREVIOUS ECG IS PRESENT peaked T wave Confirmed by Varney Biles [phone]) on 07/04/2022 9:57:05 PM ED ECG REPORT   Date: 07/04/2022  Rate: 73  Rhythm: normal sinus rhythm  QRS Axis: normal  Intervals: QT prolonged  ST/T Wave abnormalities: nonspecific ST/T changes  Conduction Disutrbances:nonspecific intraventricular conduction delay  Narrative Interpretation:   Old EKG Reviewed: changes noted Peaked T waves noted I have personally reviewed the EKG tracing and agree with the computerized printout as noted.  Radiology DG Chest 1 View  Result Date: 07/04/2022 CLINICAL DATA:  eval for volume overload EXAM: CHEST  1 VIEW COMPARISON:  06/17/2022 FINDINGS: Cardiac silhouette is prominent. There is pulmonary interstitial prominence with vascular congestion. Volume loss or consolidation at the left base. No pneumothorax or pleural effusion identified. IMPRESSION: Findings suggest CHF. Left base volume loss or consolidation.  Electronically Signed   By: Sammie Bench M.D.   On: 07/04/2022 22:24   DG Clavicle Left  Result Date: 07/04/2022 CLINICAL DATA:  Patient fell 6 days ago.  Clavicle pain. EXAM: LEFT CLAVICLE - 2+ VIEWS COMPARISON:  Shoulder study from 06/29/2022 FINDINGS: Oblique fracture of the distal left clavicle again noted without substantial distraction or displacement of fracture fragments in the interval since the prior study. Acromioclavicular and coracoclavicular distances are preserved. IMPRESSION: Oblique fracture of the distal left clavicle. Imaging features appear stable since 06/29/2022. Electronically Signed   By: Misty Stanley M.D.   On: 07/04/2022 19:44   DG Knee Complete 4 Views Right  Result Date: 07/04/2022 CLINICAL DATA:  Fall knee injury EXAM: RIGHT KNEE - COMPLETE 4+ VIEW COMPARISON:  None Available. FINDINGS: No acute fracture or malalignment. Osseous fusion of the fibular head and proximal tibia. Joint spaces are maintained. No significant knee effusion. IMPRESSION: No acute osseous abnormality Electronically Signed   By: Donavan Foil M.D.   On: 07/04/2022 17:48    Procedures .Critical Care  Performed by: Varney Biles, MD Authorized by: Varney Biles, MD   Critical care provider statement:    Critical care time (minutes):  48   Critical care was necessary to treat or prevent imminent or life-threatening deterioration of the following conditions:  Renal failure and metabolic crisis   Critical care was time spent personally by me on the following activities:  Development of treatment plan with patient or surrogate, discussions with consultants, evaluation of patient's response to treatment, examination of patient, ordering and review of laboratory studies, ordering and review of radiographic studies, ordering and performing treatments and interventions, pulse oximetry, re-evaluation of patient's condition and review of old charts     Medications Ordered in ED Medications   calcium gluconate 1 g/ 50 mL sodium chloride IVPB (has no administration in time range)  insulin aspart (novoLOG) injection 5 Units (has no administration in time range)    And  dextrose 50 % solution 50 mL (has no administration in time range)  dextrose 10 % infusion (has no administration in time range)  sodium bicarbonate injection 50 mEq (has no administration in time range)  sodium zirconium cyclosilicate (LOKELMA) packet 10 g (has no administration in time range)  HYDROcodone-acetaminophen (NORCO/VICODIN) 5-325 MG per tablet 1 tablet (1 tablet Oral Given 07/04/22 1951)    ED Course/ Medical Decision Making/ A&P Clinical Course as of 07/04/22 2330  Thu Jul 04, 2022  2159 ED EKG EKG is concerning for peaked T waves.  Patient counseled again on getting blood work, this time he agrees. [AN]  2329 X-ray of the chest independently interpreted.  There is evidence of pulmonary edema.  Potassium is over 6, hyperkalemia medications ordered.  Nephrology to be consulted. [AN]    Clinical Course User Index [AN] Varney Biles, MD                             Medical Decision Making Amount and/or Complexity of Data Reviewed Radiology: ordered. ECG/medicine tests:  Decision-making details documented in ED Course.  Risk OTC drugs. Prescription drug management.   This patient presents to the ED with chief complaint(s) of right knee pain with pertinent past medical history of ESRD on hemodialysis, recent clavicular fracture.The complaint involves an extensive differential diagnosis and also carries with it a high risk of complications and morbidity.    The differential diagnosis includes knee fracture, knee contusion. Patient is also complaining of worsening pain over his clavicle after the fall.  Patient is not wearing his immobilizer at this time.  The initial plan is to x-ray of the knee, x-ray of the clavicle.   Additional history obtained: Records reviewed  I reviewed patient's  previous x-rays.  Independent labs interpretation:  The following labs were independently interpreted: Patient has declined labs.  I have asked him on 2 separate occasions, he continues to decline.  He states that he does not need lab, he does not want to be poked.  He understands that he missed dialysis, we are unable to tell him what his potassium is.  Independent visualization and interpretation of imaging: - I independently visualized the following imaging with scope of interpretation limited to determining acute life threatening conditions related to emergency care: X-ray of the knee, which revealed no evidence of knee fracture.  Treatment and Reassessment: Results of the ER workup discussed with the patient.  Made aware that the x-ray of the knee is fine.  X-ray of the clavicle shows no concerning findings, however he needs to keep the arm in the sling.  I requested the nurse to get an EKG, the patient can be discharged thereafter.  Final Clinical Impression(s) / ED Diagnoses Final diagnoses:  Fall, initial encounter  Closed nondisplaced fracture of clavicle, unspecified laterality, unspecified part of clavicle, initial encounter  Hyperkalemia  Acute pulmonary edema ([place])    Rx / DC Orders ED Discharge Orders          Ordered    oxyCODONE-acetaminophen (PERCOCET/ROXICET) 5-325 MG tablet  Every 6 hours PRN,   Status:  Discontinued        07/04/22 2112    oxyCODONE-acetaminophen (PERCOCET/ROXICET) 5-325 MG tablet  Every 12 hours PRN        07/04/22 2159              Varney Biles, MD 07/04/22 2116    Varney Biles, MD 07/04/22 2330

## 2022-07-05 ENCOUNTER — Other Ambulatory Visit: Payer: Self-pay

## 2022-07-05 DIAGNOSIS — S42035A Nondisplaced fracture of lateral end of left clavicle, initial encounter for closed fracture: Secondary | ICD-10-CM | POA: Diagnosis not present

## 2022-07-05 LAB — CBC
HCT: 37.5 % — ABNORMAL LOW (ref 39.0–52.0)
Hemoglobin: 11.5 g/dL — ABNORMAL LOW (ref 13.0–17.0)
MCH: 27.8 pg (ref 26.0–34.0)
MCHC: 30.7 g/dL (ref 30.0–36.0)
MCV: 90.8 fL (ref 80.0–100.0)
Platelets: 341 10*3/uL (ref 150–400)
RBC: 4.13 MIL/uL — ABNORMAL LOW (ref 4.22–5.81)
RDW: 17.2 % — ABNORMAL HIGH (ref 11.5–15.5)
WBC: 7.5 10*3/uL (ref 4.0–10.5)
nRBC: 0 % (ref 0.0–0.2)

## 2022-07-05 LAB — HEPATITIS B SURFACE ANTIGEN: Hepatitis B Surface Ag: NONREACTIVE

## 2022-07-05 LAB — RENAL FUNCTION PANEL
Albumin: 3 g/dL — ABNORMAL LOW (ref 3.5–5.0)
Anion gap: 15 (ref 5–15)
BUN: 76 mg/dL — ABNORMAL HIGH (ref 6–20)
CO2: 22 mmol/L (ref 22–32)
Calcium: 8.2 mg/dL — ABNORMAL LOW (ref 8.9–10.3)
Chloride: 102 mmol/L (ref 98–111)
Creatinine, Ser: 10.34 mg/dL — ABNORMAL HIGH (ref 0.61–1.24)
GFR, Estimated: 6 mL/min — ABNORMAL LOW (ref >60)
Glucose, Bld: 92 mg/dL (ref 70–99)
Phosphorus: 4.8 mg/dL — ABNORMAL HIGH (ref 2.5–4.6)
Potassium: 5.6 mmol/L — ABNORMAL HIGH (ref 3.5–5.1)
Sodium: 139 mmol/L (ref 135–145)

## 2022-07-05 MED ORDER — ALTEPLASE 2 MG IJ SOLR: 2.0000 mg | Freq: Once | INTRAMUSCULAR | Status: DC | PRN

## 2022-07-05 MED ORDER — HEPARIN SODIUM (PORCINE) 1000 UNIT/ML DIALYSIS: 1000.0000 [IU] | INTRAMUSCULAR | Status: DC | PRN

## 2022-07-05 MED ORDER — CHLORHEXIDINE GLUCONATE CLOTH 2 % EX PADS: 6.0000 | MEDICATED_PAD | Freq: Every day | CUTANEOUS | Status: DC

## 2022-07-05 MED ORDER — LIDOCAINE HCL (PF) 1 % IJ SOLN: 5.0000 mL | INTRAMUSCULAR | Status: DC | PRN

## 2022-07-05 MED ORDER — HEPARIN SODIUM (PORCINE) 1000 UNIT/ML DIALYSIS: 20.0000 [IU]/kg | INTRAMUSCULAR | Status: DC | PRN

## 2022-07-05 MED ORDER — LIDOCAINE-PRILOCAINE 2.5-2.5 % EX CREA: 1.0000 | TOPICAL_CREAM | CUTANEOUS | Status: DC | PRN

## 2022-07-05 MED ORDER — ANTICOAGULANT SODIUM CITRATE 4% (200MG/5ML) IV SOLN: 5.0000 mL | Status: DC | PRN

## 2022-07-05 MED ORDER — PENTAFLUOROPROP-TETRAFLUOROETH EX AERO: 1.0000 | INHALATION_SPRAY | CUTANEOUS | Status: DC | PRN

## 2022-07-05 NOTE — ED Notes (Signed)
Discharge instructions reviewed with patient. Patient's transportation service set up. Patient refused VS reassessment. Patient out to lobby via wheelchair to wait for patient transportation.

## 2022-07-05 NOTE — ED Notes (Signed)
Patient reports needing this RN to call his [employer] ride [employer] to pick him up post discharge [phone]

## 2022-07-05 NOTE — ED Provider Notes (Signed)
  Physical Exam  BP (!) 168/96   Pulse 82   Temp 98.4 F (36.9 C) (Oral)   Resp 15   Ht 5' 7"$  (1.702 m)   Wt 61.7 kg   SpO2 94%   BMI 21.30 kg/m   Physical Exam  Procedures  Procedures  ED Course / MDM   Clinical Course as of 07/05/22 0740  Thu Jul 04, 2022  2159 ED EKG EKG is concerning for peaked T waves.  Patient counseled again on getting blood work, this time he agrees. [AN]  2329 X-ray of the chest independently interpreted.  There is evidence of pulmonary edema.  Potassium is over 6, hyperkalemia medications ordered.  Nephrology to be consulted. [AN]    Clinical Course User Index [AN] Varney Biles, MD   Medical Decision Making Amount and/or Complexity of Data Reviewed Radiology: ordered. ECG/medicine tests:  Decision-making details documented in ED Course.  Risk OTC drugs. Prescription drug management.   Returns from dialysis.  Feeling much better.  No respiratory distress.  Sling given.  Pain medicines have been prescribed.  Discharge home with outpatient follow-up       Davonna Belling, MD 07/05/22 [phone]

## 2022-07-05 NOTE — Progress Notes (Signed)
Late Entry Note  Contacted [facility] to advise clinic that pt was d/c from ED today and should resume care tomorrow.   Melven Sartorius Renal Navigator [phone]

## 2022-07-05 NOTE — Progress Notes (Signed)
Pt normally on TTS schedule at [facility] but missed HD today.  In the ED, K up to 6.8 with some  CHF on CXR.  Plan for urgent HD today and then to return to ED for possible discharge to home.

## 2022-07-05 NOTE — ED Notes (Signed)
Patient returned from dialysis, requesting IV catheter to be removed. IV remover per patient request.

## 2022-07-06 LAB — HEPATITIS B SURFACE ANTIBODY, QUANTITATIVE: Hep B S AB Quant (Post): <3.1 m[IU]/mL — ABNORMAL LOW (ref >9.9)

## 2022-07-10 ENCOUNTER — Emergency Department (HOSPITAL_COMMUNITY)
Admission: EM | Admit: 2022-07-10 | Discharge: 2022-07-11 | Disposition: A | Discharge status: Home / Self Care | Payer: Commercial Managed Care - HMO | Attending: Emergency Medicine | Admitting: Emergency Medicine

## 2022-07-10 ENCOUNTER — Other Ambulatory Visit: Payer: Self-pay

## 2022-07-10 ENCOUNTER — Encounter (HOSPITAL_COMMUNITY): Payer: Self-pay

## 2022-07-10 ENCOUNTER — Emergency Department (HOSPITAL_COMMUNITY): Payer: Commercial Managed Care - HMO

## 2022-07-10 DIAGNOSIS — R600 Localized edema: Secondary | ICD-10-CM | POA: Diagnosis present

## 2022-07-10 DIAGNOSIS — Z794 Long term (current) use of insulin: Secondary | ICD-10-CM | POA: Insufficient documentation

## 2022-07-10 DIAGNOSIS — Z7982 Long term (current) use of aspirin: Secondary | ICD-10-CM | POA: Insufficient documentation

## 2022-07-10 DIAGNOSIS — M7989 Other specified soft tissue disorders: Secondary | ICD-10-CM

## 2022-07-10 DIAGNOSIS — N186 End stage renal disease: Secondary | ICD-10-CM | POA: Diagnosis not present

## 2022-07-10 DIAGNOSIS — Z992 Dependence on renal dialysis: Secondary | ICD-10-CM | POA: Insufficient documentation

## 2022-07-10 DIAGNOSIS — I509 Heart failure, unspecified: Secondary | ICD-10-CM | POA: Diagnosis not present

## 2022-07-10 NOTE — ED Triage Notes (Signed)
Pt fell today d/t leg swelling and leg pain for approx 1 month but today fell  and hurt bilat knees.  Pt went to dialysis yesterday and has not missed any appt - hx of CHF.

## 2022-07-10 NOTE — ED Provider Triage Note (Signed)
Emergency Medicine Provider Triage Evaluation Note  [name] , a 52 y.o. male  was evaluated in triage.  Pt complains of bilateral leg swelling. He is worried about a blood clot. He fell today and hurt his bilateral knees. Has not missed any dialysis.  Review of Systems  Positive: Leg swelling Negative: CP, SOB  Physical Exam  BP (!) 171/98 (BP Location: Right Arm)   Pulse 88   Temp 98.4 F (36.9 C) (Oral)   Resp 16   Ht 5' 7"$  (1.702 m)   Wt 72.6 kg   SpO2 100%   BMI 25.06 kg/m  Gen:   Awake, no distress   Resp:  Normal effort  MSK:   Moves extremities without difficulty  Medical Decision Making  Medically screening exam initiated at 8:39 PM.  Appropriate orders placed.  COSTANTINO ROSSBERG was informed that the remainder of the evaluation will be completed by another provider, this initial triage assessment does not replace that evaluation, and the importance of remaining in the ED until their evaluation is complete.     Regan Lemming, MD 07/10/22 2041

## 2022-07-11 ENCOUNTER — Emergency Department (HOSPITAL_BASED_OUTPATIENT_CLINIC_OR_DEPARTMENT_OTHER): Payer: Commercial Managed Care - HMO

## 2022-07-11 ENCOUNTER — Emergency Department (HOSPITAL_COMMUNITY): Payer: Commercial Managed Care - HMO

## 2022-07-11 ENCOUNTER — Other Ambulatory Visit: Payer: Self-pay

## 2022-07-11 DIAGNOSIS — R600 Localized edema: Secondary | ICD-10-CM | POA: Diagnosis not present

## 2022-07-11 DIAGNOSIS — M7989 Other specified soft tissue disorders: Secondary | ICD-10-CM

## 2022-07-11 DIAGNOSIS — R52 Pain, unspecified: Secondary | ICD-10-CM | POA: Diagnosis not present

## 2022-07-11 LAB — CBC WITH DIFFERENTIAL/PLATELET
Abs Immature Granulocytes: 0.03 10*3/uL (ref 0.00–0.07)
Basophils Absolute: 0 10*3/uL (ref 0.0–0.1)
Basophils Relative: 0 %
Eosinophils Absolute: 0.2 10*3/uL (ref 0.0–0.5)
Eosinophils Relative: 4 %
HCT: 40 % (ref 39.0–52.0)
Hemoglobin: 12.4 g/dL — ABNORMAL LOW (ref 13.0–17.0)
Immature Granulocytes: 1 %
Lymphocytes Relative: 16 %
Lymphs Abs: 1 10*3/uL (ref 0.7–4.0)
MCH: 27.9 pg (ref 26.0–34.0)
MCHC: 31 g/dL (ref 30.0–36.0)
MCV: 89.9 fL (ref 80.0–100.0)
Monocytes Absolute: 0.7 10*3/uL (ref 0.1–1.0)
Monocytes Relative: 12 %
Neutro Abs: 4.1 10*3/uL (ref 1.7–7.7)
Neutrophils Relative %: 67 %
Platelets: 351 10*3/uL (ref 150–400)
RBC: 4.45 MIL/uL (ref 4.22–5.81)
RDW: 17 % — ABNORMAL HIGH (ref 11.5–15.5)
WBC: 6 10*3/uL (ref 4.0–10.5)
nRBC: 0 % (ref 0.0–0.2)

## 2022-07-11 LAB — BASIC METABOLIC PANEL
Anion gap: 14 (ref 5–15)
BUN: 66 mg/dL — ABNORMAL HIGH (ref 6–20)
CO2: 24 mmol/L (ref 22–32)
Calcium: 8.1 mg/dL — ABNORMAL LOW (ref 8.9–10.3)
Chloride: 96 mmol/L — ABNORMAL LOW (ref 98–111)
Creatinine, Ser: 8.26 mg/dL — ABNORMAL HIGH (ref 0.61–1.24)
GFR, Estimated: 7 mL/min — ABNORMAL LOW (ref >60)
Glucose, Bld: 163 mg/dL — ABNORMAL HIGH (ref 70–99)
Potassium: 4.8 mmol/L (ref 3.5–5.1)
Sodium: 134 mmol/L — ABNORMAL LOW (ref 135–145)

## 2022-07-11 LAB — D-DIMER, QUANTITATIVE: D-Dimer, Quant: 5.07 ug/mL-FEU — ABNORMAL HIGH (ref 0.00–0.50)

## 2022-07-11 MED ORDER — ACETAMINOPHEN 500 MG PO TABS
1000.0000 mg | ORAL_TABLET | Freq: Once | ORAL | Status: AC
  Administered 2022-07-11: 1000 mg via ORAL
  Filled 2022-07-11: qty 2

## 2022-07-11 NOTE — ED Provider Notes (Signed)
Clinical Course as of 07/11/22 1206  Thu Jul 11, 2238  [id number] 52 year old ESRD patient has not missed recent sessions.  Chest x-ray without overt edema.  Potassium 4.8.  Presented with lower extremity swelling and pain which seems to be chronic for patient.  Follow-up knee x-rays and bilateral lower extremity ultrasound. [VB]  1205 Reassessed patient resting comfortably in room he is requesting pain medicine will give Tylenol.  He has no evidence of DVT on bilateral ultrasounds.  Has chronic pitting edema.  There is no evidence of infection.  His knee x-rays are unremarkable.  He is requesting dialysis however I told him there is no emergent need for dialysis at this time.  He is not hypoxic, there is no overt pulmonary edema on x-ray, potassium 4.8.  Told him to go to his dialysis center tomorrow for dialysis. [VB]    Clinical Course User Index [VB] Elgie Congo, MD      Elgie Congo, MD 07/11/22 [phone]

## 2022-07-11 NOTE — Progress Notes (Signed)
Bilateral lower extremity venous study completed.   Preliminary results relayed to [place], [place].  Please see CV Procedures for preliminary results.  Muaaz Brau, RVT  10:47 AM 07/11/22

## 2022-07-11 NOTE — ED Provider Notes (Signed)
[name] Provider Note   CSN: [id number] Arrival date & time: 07/10/22  1958     History  Chief Complaint  Patient presents with   Leg Pain   Leg Swelling    [name] is a 52 y.o. male.  Patient presents to the emergency department with complaints of swelling of his legs.  Patient reports that he is a dialysis patient but he has not missed any dialysis sessions.  His legs have not been going down with dialysis.  He reports that this caused him to have a fall earlier, landed on his knees.       Home Medications Prior to Admission medications   Medication Sig Start Date End Date Taking? Authorizing Provider  acetaminophen (TYLENOL) 500 MG tablet Take 1 tablet (500 mg total) by mouth every 8 (eight) hours as needed for mild pain. 05/28/22   Thurnell Lose, MD  amLODipine (NORVASC) 10 MG tablet Take 1 tablet (10 mg total) by mouth daily. 05/26/22   Thurnell Lose, MD  amLODipine (NORVASC) 10 MG tablet Take 1 tablet (10 mg total) by mouth daily. 06/18/22     amLODipine (NORVASC) 10 MG tablet Take 1 tablet (10 mg total) by mouth daily. 06/18/22     ASPIRIN LOW DOSE 81 MG tablet Take 1 tablet (81 mg total) by mouth daily. 05/28/22   Thurnell Lose, MD  blood glucose meter kit and supplies KIT Dispense based on patient and insurance preference. Use up to four times daily as directed. (FOR ICD-9 250.00, 250.01). For QAC - HS accuchecks. 05/25/22   Thurnell Lose, MD  blood glucose meter kit and supplies KIT Dispense based on patient and insurance preference. Use up to four times daily as directed. (FOR ICD-9 250.00, 250.01). For QAC - HS accuchecks. 05/28/22   Thurnell Lose, MD  calcium acetate (PHOSLO) 667 MG capsule Take 3 capsules (2,001 mg total) by mouth 3 (three) times daily. 05/28/22   Thurnell Lose, MD  carvedilol (COREG) 6.25 MG tablet Take 1 tablet (6.25 mg total) by mouth 2 (two) times daily with a meal. 05/28/22   Thurnell Lose, MD  Fingerstix Lancets MISC Use as directed up to 4 times daily. 05/28/22   Thurnell Lose, MD  glucose blood test strip Use to check fasting blood sugar once daily. 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 05/28/22   Thurnell Lose, MD  insulin detemir (LEVEMIR) 100 UNIT/ML injection Inject 0.35 mLs (35 Units total) into the skin daily. max daily dose 100 units per day 06/18/22     Insulin Glargine (BASAGLAR KWIKPEN) 100 UNIT/ML Inject 35 Units into the skin daily. Do not exceed 100 units per day. 06/19/22     insulin glargine (LANTUS SOLOSTAR) 100 UNIT/ML Solostar Pen Inject 35 Units into the skin daily.Not to exceed 100 units per day. 06/19/22     Insulin Pen Needle 32G X 4 MM MISC Use with insulin pens 05/28/22   Thurnell Lose, MD  Insulin Syringe-Needle U-100 25G X 1" 1 ML MISC For 4 times a day  insulin SQ, 1 month supply. Diagnosis E11.65 05/28/22   Thurnell Lose, MD  isosorbide-hydrALAZINE (BIDIL) 20-37.5 MG tablet Take 1 tablet by mouth 3 (three) times daily. 05/28/22   Thurnell Lose, MD  oxyCODONE-acetaminophen (PERCOCET/ROXICET) 5-325 MG tablet Take 1 tablet by mouth every 12 (twelve) hours as needed for severe pain. 07/04/22   Varney Biles, MD  PROTONIX 40 MG tablet Take 1 tablet (40 mg total) by mouth daily. 05/25/22   Thurnell Lose, MD  sevelamer carbonate (RENVELA) 800 MG tablet Take 2 tablets (1,600 mg total) by mouth 3 (three) times daily with meals. May also take 1 tablet (800 mg total) 2 (two) times daily as needed with snacks. 05/28/22   Thurnell Lose, MD  sitaGLIPtin (JANUVIA) 25 MG tablet take 1 tablet by mouth daily Patient not taking: Reported on 05/20/2022  02/27/22     sitaGLIPtin (JANUVIA) 25 MG tablet Take 1 tablet (25 mg total) by mouth daily. 06/18/22     sodium bicarbonate 650 MG tablet Take 650 mg by mouth daily. Patient not taking: Reported on 05/20/2022 03/19/22   [provider]  triamcinolone cream (KENALOG) 0.1 % Apply 1 Application topically 2 (two) times daily. Patient not taking: Reported on 05/20/2022 01/06/22   Nyoka Lint, PA-C  gabapentin (NEURONTIN) 300 MG capsule Take 1 capsule (300 mg total) by mouth 3 (three) times daily. Patient not taking: Reported on 07/27/2019 07/29/18 08/30/19  [name], FNP  sildenafil (VIAGRA) 25 MG tablet Take 1 tablet (25 mg total) by mouth as needed for erectile dysfunction. for erectile dysfunction. Patient not taking: Reported on 08/11/2020 10/13/19 11/27/20  [name], FNP      Allergies    Sulfa antibiotics, Keflex [cephalexin], Other, and Bactrim [sulfamethoxazole-trimethoprim]    Review of Systems   Review of Systems  Physical Exam Updated Vital Signs BP (!) 179/113   Pulse 82   Temp 98.3 F (36.8 C) (Oral)   Resp 18   Ht 5' 7"$  (1.702 m)   Wt 72.6 kg   SpO2 100%   BMI 25.06 kg/m  Physical Exam Vitals and nursing note reviewed.  Constitutional:      General: He is not in acute distress.    Appearance: He is well-developed.  HENT:     Head: Normocephalic and atraumatic.     Mouth/Throat:     Mouth: Mucous membranes are moist.  Eyes:     General: Vision grossly intact. Gaze aligned appropriately.     Extraocular Movements: Extraocular movements intact.     Conjunctiva/sclera: Conjunctivae normal.  Cardiovascular:     Rate and Rhythm: Normal rate and regular rhythm.     Pulses: Normal pulses.     Heart sounds: Normal heart sounds, S1 normal and S2 normal. No murmur heard.    No friction rub. No gallop.  Pulmonary:     Effort: Pulmonary effort is normal. No respiratory distress.     Breath sounds: Normal breath sounds.  Abdominal:     Palpations: Abdomen  is soft.     Tenderness: There is no abdominal tenderness. There is no guarding or rebound.     Hernia: No hernia is present.  Musculoskeletal:        General: No swelling.     Cervical back: Full passive range of motion without pain, normal range of motion and neck supple. No pain with movement, spinous process tenderness or muscular tenderness. Normal range of motion.     Right knee: Tenderness present.  Left knee: Tenderness present.     Right lower leg: Edema present.     Left lower leg: Edema present.  Skin:    General: Skin is warm and dry.     Capillary Refill: Capillary refill takes less than 2 seconds.     Findings: No ecchymosis, erythema, lesion or wound.  Neurological:     Mental Status: He is alert and oriented to person, place, and time.     GCS: GCS eye subscore is 4. GCS verbal subscore is 5. GCS motor subscore is 6.     Cranial Nerves: Cranial nerves 2-12 are intact.     Sensory: Sensation is intact.     Motor: Motor function is intact. No weakness or abnormal muscle tone.     Coordination: Coordination is intact.  Psychiatric:        Mood and Affect: Mood normal.        Speech: Speech normal.        Behavior: Behavior normal.     ED Results / Procedures / Treatments   Labs (all labs ordered are listed, but only abnormal results are displayed) Labs Reviewed  CBC WITH DIFFERENTIAL/PLATELET - Abnormal; Notable for the following components:      Result Value   Hemoglobin 12.4 (*)    RDW 17.0 (*)    All other components within normal limits  BASIC METABOLIC PANEL - Abnormal; Notable for the following components:   Sodium 134 (*)    Chloride 96 (*)    Glucose, Bld 163 (*)    BUN 66 (*)    Creatinine, Ser 8.26 (*)    Calcium 8.1 (*)    GFR, Estimated 7 (*)    All other components within normal limits  D-DIMER, QUANTITATIVE - Abnormal; Notable for the following components:   D-Dimer, Quant 5.07 (*)    All other components within normal limits     EKG None  Radiology DG Chest 2 View  Result Date: 07/10/2022 CLINICAL DATA:  Lower extremity edema EXAM: CHEST - 2 VIEW COMPARISON:  07/04/2022 FINDINGS: Frontal and lateral views of the chest demonstrate marked enlargement of the cardiac silhouette, stable. There is chronic central vascular congestion, without acute airspace disease, effusion, or pneumothorax. No acute bony abnormalities. IMPRESSION: 1. Stable enlarged cardiac silhouette. 2. Chronic central vascular congestion without overt edema. Electronically Signed   By: Randa Ngo M.D.   On: 07/10/2022 20:46    Procedures Procedures    Medications Ordered in ED Medications - No data to display  ED Course/ Medical Decision Making/ A&P                             Medical Decision Making Amount and/or Complexity of Data Reviewed Labs: ordered. Radiology: ordered.   Patient presents with leg swelling and injury to both knees after a fall.  Differential diagnosis considered includes contusion, fracture, cellulitis, volume overload.  Patient reports that he has been going to dialysis.  Basic labs are appropriate, no electrolyte abnormality.  Chest x-ray does not show any pulmonary edema.  Patient concerned that he might have a clot.  Feel this is less likely but cannot rule it out with duplex which will be performed in the morning.  X-ray bilateral knees also.  Signout oncoming ER physician.        Final Clinical Impression(s) / ED Diagnoses Final diagnoses:  Leg swelling    Rx / DC Orders ED Discharge Orders  None         Orpah Greek, MD 07/11/22 [id number]

## 2022-07-11 NOTE — ED Notes (Signed)
Patient standing propped against cabinet while on phone. Have asked him to sit down on bed since he stated he has been falling. However, stated he did not want to sit down and was ok propping up against cabinet. Also denies dressing into gown.,

## 2022-07-11 NOTE — Discharge Instructions (Addendum)
There were no signs of infection of your legs, broken bones, dislocations, blood clots or any other concerning findings.  You need to go to your dialysis center for your dialysis.  You do not require dialysis emergently at this time.

## 2022-07-12 ENCOUNTER — Other Ambulatory Visit: Payer: Self-pay

## 2022-07-15 ENCOUNTER — Other Ambulatory Visit: Payer: Self-pay

## 2022-07-15 ENCOUNTER — Encounter (HOSPITAL_COMMUNITY): Payer: Self-pay | Admitting: Emergency Medicine

## 2022-07-15 ENCOUNTER — Emergency Department (HOSPITAL_COMMUNITY)
Admission: EM | Admit: 2022-07-15 | Discharge: 2022-07-15 | Disposition: A | Discharge status: Home / Self Care | Payer: Commercial Managed Care - HMO | Attending: Emergency Medicine | Admitting: Emergency Medicine

## 2022-07-15 DIAGNOSIS — Z79899 Other long term (current) drug therapy: Secondary | ICD-10-CM | POA: Insufficient documentation

## 2022-07-15 DIAGNOSIS — Z794 Long term (current) use of insulin: Secondary | ICD-10-CM | POA: Insufficient documentation

## 2022-07-15 DIAGNOSIS — I10 Essential (primary) hypertension: Secondary | ICD-10-CM | POA: Insufficient documentation

## 2022-07-15 DIAGNOSIS — J339 Nasal polyp, unspecified: Secondary | ICD-10-CM | POA: Insufficient documentation

## 2022-07-15 DIAGNOSIS — J330 Polyp of nasal cavity: Secondary | ICD-10-CM

## 2022-07-15 DIAGNOSIS — Z7982 Long term (current) use of aspirin: Secondary | ICD-10-CM | POA: Diagnosis not present

## 2022-07-15 MED ORDER — ISOSORB DINITRATE-HYDRALAZINE 20-37.5 MG PO TABS
1.0000 | ORAL_TABLET | Freq: Once | ORAL | Status: AC
  Administered 2022-07-15: 1 via ORAL
  Filled 2022-07-15: qty 1

## 2022-07-15 MED ORDER — FLUTICASONE PROPIONATE 50 MCG/ACT NA SUSP
1.0000 | Freq: Once | NASAL | Status: AC
  Administered 2022-07-15: 1 via NASAL
  Filled 2022-07-15: qty 16

## 2022-07-15 MED ORDER — TRAMADOL HCL 50 MG PO TABS
50.0000 mg | ORAL_TABLET | Freq: Two times a day (BID) | ORAL | 0 refills | Status: DC | PRN
  Filled 2022-07-15: qty 20, 10d supply, fill #0

## 2022-07-15 MED ORDER — CARVEDILOL 3.125 MG PO TABS
6.2500 mg | ORAL_TABLET | Freq: Once | ORAL | Status: AC
  Administered 2022-07-15: 6.25 mg via ORAL
  Filled 2022-07-15: qty 2

## 2022-07-15 MED ORDER — AMLODIPINE BESYLATE 5 MG PO TABS
10.0000 mg | ORAL_TABLET | Freq: Once | ORAL | Status: AC
  Administered 2022-07-15: 10 mg via ORAL
  Filled 2022-07-15: qty 2

## 2022-07-15 NOTE — ED Triage Notes (Signed)
Patient arrives in wheelchair c/o right nasal polyp x 3 days. Patient is dialysis patient with last treatment Saturday. States he only had 2 hours due to being late to his appt.

## 2022-07-15 NOTE — Discharge Instructions (Signed)
As we discussed, I have given you Flonase which is an inhaled steroid for you to use for management of your nasal polyps.  Follow-up with your doctor for any additional health needs.  Return if development of any new or worsening symptoms.

## 2022-07-15 NOTE — ED Provider Notes (Signed)
[facility] Provider Note   CSN: [id number] Arrival date & time: 07/15/22  1307     History  Chief Complaint  Patient presents with   Nasal Polyps    [name] is a 52 y.o. male.  Patient with history of ESRD on hemodialysis presents today with complaints of nasal polyp. He states that he first noticed a polyp in his nose a few days ago. States the polyp is keeping him from being able to breath out of his right nostril. He has not tried anything for this. He had a half session of dialysis on Saturday due to being late, is scheduled for dialysis in the morning. Denies chest pain or shortness of breath. No fevers, chills, cough, or congestion. Of note, patient has not had his blood pressure medications this morning and states he plans to pick them up at the pharmacy when he leaves here.  The history is provided by the patient. No language interpreter was used.       Home Medications Prior to Admission medications   Medication Sig Start Date End Date Taking? Authorizing Provider  acetaminophen (TYLENOL) 500 MG tablet Take 1 tablet (500 mg total) by mouth every 8 (eight) hours as needed for mild pain. 05/28/22   Thurnell Lose, MD  amLODipine (NORVASC) 10 MG tablet Take 1 tablet (10 mg total) by mouth daily. 05/26/22   Thurnell Lose, MD  amLODipine (NORVASC) 10 MG tablet Take 1 tablet (10 mg total) by mouth daily. 06/18/22     amLODipine (NORVASC) 10 MG tablet Take 1 tablet (10 mg total) by mouth daily. 06/18/22     ASPIRIN LOW DOSE 81 MG tablet Take 1 tablet (81 mg total) by mouth daily. 05/28/22   Thurnell Lose, MD  blood glucose meter kit and supplies KIT Dispense based on patient and insurance preference. Use up to four times daily as directed. (FOR ICD-9 250.00, 250.01). For QAC - HS accuchecks. 05/25/22   Thurnell Lose, MD  blood glucose meter kit and supplies KIT Dispense based on patient and insurance preference. Use up to  four times daily as directed. (FOR ICD-9 250.00, 250.01). For QAC - HS accuchecks. 05/28/22   Thurnell Lose, MD  calcium acetate (PHOSLO) 667 MG capsule Take 3 capsules (2,001 mg total) by mouth 3 (three) times daily. 05/28/22   Thurnell Lose, MD  carvedilol (COREG) 6.25 MG tablet Take 1 tablet (6.25 mg total) by mouth 2 (two) times daily with a meal. 05/28/22   Thurnell Lose, MD  Fingerstix Lancets MISC Use as directed up to 4 times daily. 05/28/22   Thurnell Lose, MD  glucose blood test strip Use to check fasting blood sugar once daily. 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 05/28/22   Thurnell Lose, MD  insulin detemir (LEVEMIR) 100 UNIT/ML injection Inject 0.35 mLs (35 Units total) into the skin daily. max daily dose 100 units per day 06/18/22     Insulin Glargine (BASAGLAR KWIKPEN) 100 UNIT/ML Inject 35 Units into the skin daily. Do not exceed 100 units per day.  06/19/22     insulin glargine (LANTUS SOLOSTAR) 100 UNIT/ML Solostar Pen Inject 35 Units into the skin daily.Not to exceed 100 units per day. 06/19/22     Insulin Pen Needle 32G X 4 MM MISC Use with insulin pens 05/28/22   Thurnell Lose, MD  Insulin Syringe-Needle U-100 25G X 1" 1 ML MISC For 4 times a day insulin SQ, 1 month supply. Diagnosis E11.65 05/28/22   Thurnell Lose, MD  isosorbide-hydrALAZINE (BIDIL) 20-37.5 MG tablet Take 1 tablet by mouth 3 (three) times daily. 05/28/22   Thurnell Lose, MD  oxyCODONE-acetaminophen (PERCOCET/ROXICET) 5-325 MG tablet Take 1 tablet by mouth every 12 (twelve) hours as needed for severe pain. 07/04/22   Varney Biles, MD  PROTONIX 40 MG tablet Take 1 tablet (40 mg total) by mouth daily.  05/25/22   Thurnell Lose, MD  sevelamer carbonate (RENVELA) 800 MG tablet Take 2 tablets (1,600 mg total) by mouth 3 (three) times daily with meals. May also take 1 tablet (800 mg total) 2 (two) times daily as needed with snacks. 05/28/22   Thurnell Lose, MD  sitaGLIPtin (JANUVIA) 25 MG tablet take 1 tablet by mouth daily Patient not taking: Reported on 05/20/2022 02/27/22     sitaGLIPtin (JANUVIA) 25 MG tablet Take 1 tablet (25 mg total) by mouth daily. 06/18/22     sodium bicarbonate 650 MG tablet Take 650 mg by mouth daily. Patient not taking: Reported on 05/20/2022 03/19/22   [provider]  traMADol (ULTRAM) 50 MG tablet Take 1 tablet by mouth twice a day as needed for pain 07/15/22     triamcinolone cream (KENALOG) 0.1 % Apply 1 Application topically 2 (two) times daily. Patient not taking: Reported on 05/20/2022 01/06/22   Nyoka Lint, PA-C  gabapentin (NEURONTIN) 300 MG capsule Take 1 capsule (300 mg total) by mouth 3 (three) times daily. Patient not taking: Reported on 07/27/2019 07/29/18 08/30/19  [name], FNP  sildenafil (VIAGRA) 25 MG tablet Take 1 tablet (25 mg total) by mouth as needed for erectile dysfunction. for erectile dysfunction. Patient not taking: Reported on 08/11/2020 10/13/19 11/27/20  [name], FNP      Allergies    Sulfa antibiotics, Keflex [cephalexin], Other, and Bactrim [sulfamethoxazole-trimethoprim]    Review of Systems   Review of Systems  All other systems reviewed and are negative.   Physical Exam Updated Vital Signs BP (!) 190/110   Pulse 86   Temp 98 F (36.7 C)   Resp 16   Ht '5\' 7"'$  (1.702 m)   Wt 72.6 kg   SpO2 98%   BMI 25.06 kg/m  Physical Exam Vitals and nursing note reviewed.  Constitutional:      General: He is not in acute distress.    Appearance: Normal appearance. He is normal weight. He is not ill-appearing, toxic-appearing or diaphoretic.  HENT:     Head: Normocephalic and atraumatic.     Nose:      Comments: Polyp in the right nare. No fluctuance, induration, erythema, warmth, or drainage. Left nare is patent Cardiovascular:     Rate and Rhythm: Normal rate.  Pulmonary:     Effort: Pulmonary effort is normal. No respiratory distress.  Musculoskeletal:        General: Normal range of motion.     Cervical back: Normal range of motion.  Skin:    General: Skin is warm and dry.  Neurological:     General: No focal deficit present.  Mental Status: He is alert.  Psychiatric:        Mood and Affect: Mood normal.        Behavior: Behavior normal.     ED Results / Procedures / Treatments   Labs (all labs ordered are listed, but only abnormal results are displayed) Labs Reviewed - No data to display  EKG None  Radiology No results found.  Procedures Procedures    Medications Ordered in ED Medications  amLODipine (NORVASC) tablet 10 mg (has no administration in time range)  carvedilol (COREG) tablet 6.25 mg (has no administration in time range)  isosorbide-hydrALAZINE (BIDIL) 20-37.5 MG per tablet 1 tablet (has no administration in time range)  fluticasone (FLONASE) 50 MCG/ACT nasal spray 1 spray (has no administration in time range)    ED Course/ Medical Decision Making/ A&P                             Medical Decision Making Risk Prescription drug management.   Patient presents today with complaints of a nasal polyp in his right nare.  He is afebrile, nontoxic-appearing, and in no acute distress with reassuring vital signs.  Physical exam reveals noninfectious appearing benign polyp in the right nare. Left nare is patent. Plan to give flonase for management with outpatient follow-up. Will also give patient's home blood pressure meds per his request as he is currently out and has not had them today. He does have plenty of refills and plans to pick them up after he leaves here. He does not currently need emergent dialysis and has scheduled session tomorrow. Patient is  reasonable for discharge, educated on red flag symptoms that would prompt immediate return. Patient discharged in stable condition.  Final Clinical Impression(s) / ED Diagnoses Final diagnoses:  Nasal polyp, benign  Hypertension, unspecified type    Rx / DC Orders ED Discharge Orders     None     An After Visit Summary was printed and given to the patient.     Nestor Lewandowsky 07/15/22 1400    Cristie Hem, MD 07/16/22 [phone]

## 2022-07-16 ENCOUNTER — Encounter (HOSPITAL_BASED_OUTPATIENT_CLINIC_OR_DEPARTMENT_OTHER): Payer: Self-pay

## 2022-07-16 ENCOUNTER — Other Ambulatory Visit: Payer: Self-pay

## 2022-07-16 DIAGNOSIS — Z794 Long term (current) use of insulin: Secondary | ICD-10-CM | POA: Insufficient documentation

## 2022-07-16 DIAGNOSIS — J339 Nasal polyp, unspecified: Secondary | ICD-10-CM | POA: Insufficient documentation

## 2022-07-16 DIAGNOSIS — Z7982 Long term (current) use of aspirin: Secondary | ICD-10-CM | POA: Diagnosis not present

## 2022-07-16 NOTE — ED Triage Notes (Signed)
Swelling of the nose for the past 3-4 days.

## 2022-07-17 ENCOUNTER — Other Ambulatory Visit: Payer: Self-pay

## 2022-07-17 ENCOUNTER — Emergency Department (HOSPITAL_BASED_OUTPATIENT_CLINIC_OR_DEPARTMENT_OTHER)
Admission: EM | Admit: 2022-07-17 | Discharge: 2022-07-17 | Disposition: A | Discharge status: Home / Self Care | Payer: Commercial Managed Care - HMO | Attending: Emergency Medicine | Admitting: Emergency Medicine

## 2022-07-17 DIAGNOSIS — J339 Nasal polyp, unspecified: Secondary | ICD-10-CM

## 2022-07-17 NOTE — ED Provider Notes (Signed)
[facility] Provider Note   CSN: [id number] Arrival date & time: 07/16/22  2341     History  Chief Complaint  Patient presents with   Facial Swelling    [name] is a 52 y.o. male.  The history is provided by the patient.  Patient presents with nasal polyp.  He reports he is getting worse over the past several days.  No other complaints     Home Medications Prior to Admission medications   Medication Sig Start Date End Date Taking? Authorizing Provider  acetaminophen (TYLENOL) 500 MG tablet Take 1 tablet (500 mg total) by mouth every 8 (eight) hours as needed for mild pain. 05/28/22   Thurnell Lose, MD  amLODipine (NORVASC) 10 MG tablet Take 1 tablet (10 mg total) by mouth daily. 05/26/22   Thurnell Lose, MD  amLODipine (NORVASC) 10 MG tablet Take 1 tablet (10 mg total) by mouth daily. 06/18/22     amLODipine (NORVASC) 10 MG tablet Take 1 tablet (10 mg total) by mouth daily. 06/18/22     ASPIRIN LOW DOSE 81 MG tablet Take 1 tablet (81 mg total) by mouth daily. 05/28/22   Thurnell Lose, MD  blood glucose meter kit and supplies KIT Dispense based on patient and insurance preference. Use up to four times daily as directed. (FOR ICD-9 250.00, 250.01). For QAC - HS accuchecks. 05/25/22   Thurnell Lose, MD  blood glucose meter kit and supplies KIT Dispense based on patient and insurance preference. Use up to four times daily as directed. (FOR ICD-9 250.00, 250.01). For QAC - HS accuchecks. 05/28/22   Thurnell Lose, MD  calcium acetate (PHOSLO) 667 MG capsule Take 3 capsules (2,001 mg total) by mouth 3 (three) times daily. 05/28/22   Thurnell Lose, MD  carvedilol (COREG) 6.25 MG tablet Take 1 tablet (6.25 mg total) by mouth 2 (two) times daily with a meal. 05/28/22   Thurnell Lose, MD  Fingerstix Lancets MISC Use as directed up to 4 times daily. 05/28/22   Thurnell Lose, MD  glucose blood test strip Use to check fasting  blood sugar once daily. 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 05/28/22   Thurnell Lose, MD  insulin detemir (LEVEMIR) 100 UNIT/ML injection Inject 0.35 mLs (35 Units total) into the skin daily. max daily dose 100 units per day 06/18/22     Insulin Glargine (BASAGLAR KWIKPEN) 100 UNIT/ML Inject 35 Units into the skin daily. Do not exceed 100 units per day. 06/19/22     insulin glargine (LANTUS SOLOSTAR) 100 UNIT/ML Solostar Pen Inject 35 Units into the skin daily.Not to exceed 100 units per day. 06/19/22     Insulin Pen Needle 32G X 4 MM MISC Use with insulin pens 05/28/22   Thurnell Lose, MD  Insulin Syringe-Needle U-100 25G X 1" 1 ML MISC For 4 times a day insulin SQ, 1 month supply. Diagnosis E11.65 05/28/22   Thurnell Lose, MD  isosorbide-hydrALAZINE (BIDIL) 20-37.5 MG tablet Take 1 tablet by mouth 3 (three) times daily. 05/28/22   Candiss Norse,  Margaree Mackintosh, MD  oxyCODONE-acetaminophen (PERCOCET/ROXICET) 5-325 MG tablet Take 1 tablet by mouth every 12 (twelve) hours as needed for severe pain. 07/04/22   Varney Biles, MD  PROTONIX 40 MG tablet Take 1 tablet (40 mg total) by mouth daily. 05/25/22   Thurnell Lose, MD  sevelamer carbonate (RENVELA) 800 MG tablet Take 2 tablets (1,600 mg total) by mouth 3 (three) times daily with meals. May also take 1 tablet (800 mg total) 2 (two) times daily as needed with snacks. 05/28/22   Thurnell Lose, MD  sitaGLIPtin (JANUVIA) 25 MG tablet take 1 tablet by mouth daily Patient not taking: Reported on 05/20/2022 02/27/22     sitaGLIPtin (JANUVIA) 25 MG tablet Take 1 tablet (25 mg total) by mouth daily. 06/18/22     sodium bicarbonate 650 MG tablet Take 650 mg by mouth  daily. Patient not taking: Reported on 05/20/2022 03/19/22   [provider]  traMADol (ULTRAM) 50 MG tablet Take 1 tablet by mouth twice a day as needed for pain 07/15/22     triamcinolone cream (KENALOG) 0.1 % Apply 1 Application topically 2 (two) times daily. Patient not taking: Reported on 05/20/2022 01/06/22   Nyoka Lint, PA-C  gabapentin (NEURONTIN) 300 MG capsule Take 1 capsule (300 mg total) by mouth 3 (three) times daily. Patient not taking: Reported on 07/27/2019 07/29/18 08/30/19  [name], FNP  sildenafil (VIAGRA) 25 MG tablet Take 1 tablet (25 mg total) by mouth as needed for erectile dysfunction. for erectile dysfunction. Patient not taking: Reported on 08/11/2020 10/13/19 11/27/20  [name], FNP      Allergies    Sulfa antibiotics, Keflex [cephalexin], Other, and Bactrim [sulfamethoxazole-trimethoprim]    Review of Systems   Review of Systems  Physical Exam Updated Vital Signs BP (!) 166/86 (BP Location: Left Arm)   Pulse 82   Temp 98.8 F (37.1 C)   Resp 18   Ht 1.702 m ('5\' 7"'$ )   Wt 63.5 kg   SpO2 96%   BMI 21.93 kg/m  Physical Exam CONSTITUTIONAL: Chronically ill-appearing, walking around the room, going through various drawers in the room looking for supplies HEAD: Normocephalic/atraumatic EYES: EOMI/PERRL ENMT: Mucous membranes moist, poor dentition, no angioedema, no intraoral swelling.  No stridor, no drooling. Small nasal polyp noted in the right nare.  No abscess noted. NEURO: Pt is awake/alert/appropriate, moves all extremitiesx4.  No facial droop.     ED Results / Procedures / Treatments   Labs (all labs ordered are listed, but only abnormal results are displayed) Labs Reviewed - No data to display  EKG None  Radiology No results found.  Procedures Procedures    Medications Ordered in ED Medications - No data to display  ED Course/ Medical Decision Making/ A&P                             Medical Decision  Making  Patient presents for his 48th ER visit in 6 months.  He is now presenting with nasal polyp, for which he was seen on February 26.  This appears to be unchanged.  He has been referred to otolaryngology        Final Clinical Impression(s) / ED Diagnoses Final diagnoses:  Nasal polyp    Rx / DC Orders ED Discharge Orders     None         Ripley Fraise, MD 07/17/22 0045

## 2022-07-19 ENCOUNTER — Other Ambulatory Visit (HOSPITAL_COMMUNITY): Payer: Self-pay

## 2022-07-19 ENCOUNTER — Other Ambulatory Visit: Payer: Self-pay

## 2022-07-21 ENCOUNTER — Ambulatory Visit (HOSPITAL_COMMUNITY)
Admission: EM | Admit: 2022-07-21 | Discharge: 2022-07-21 | Disposition: A | Discharge status: Home / Self Care | Payer: Commercial Managed Care - HMO | Attending: Internal Medicine | Admitting: Internal Medicine

## 2022-07-21 ENCOUNTER — Encounter (HOSPITAL_COMMUNITY): Payer: Self-pay

## 2022-07-21 ENCOUNTER — Other Ambulatory Visit (HOSPITAL_COMMUNITY): Payer: Self-pay

## 2022-07-21 DIAGNOSIS — J329 Chronic sinusitis, unspecified: Secondary | ICD-10-CM

## 2022-07-21 DIAGNOSIS — J339 Nasal polyp, unspecified: Secondary | ICD-10-CM | POA: Diagnosis not present

## 2022-07-21 DIAGNOSIS — N186 End stage renal disease: Secondary | ICD-10-CM

## 2022-07-21 DIAGNOSIS — Z992 Dependence on renal dialysis: Secondary | ICD-10-CM

## 2022-07-21 DIAGNOSIS — B9689 Other specified bacterial agents as the cause of diseases classified elsewhere: Secondary | ICD-10-CM

## 2022-07-21 DIAGNOSIS — I10 Essential (primary) hypertension: Secondary | ICD-10-CM

## 2022-07-21 DIAGNOSIS — S42002D Fracture of unspecified part of left clavicle, subsequent encounter for fracture with routine healing: Secondary | ICD-10-CM

## 2022-07-21 MED ORDER — DOXYCYCLINE HYCLATE 100 MG PO CAPS: 100.0000 mg | ORAL_CAPSULE | Freq: Two times a day (BID) | ORAL | 0 refills | Status: AC

## 2022-07-21 MED ORDER — DEXAMETHASONE SODIUM PHOSPHATE 10 MG/ML IJ SOLN
INTRAMUSCULAR | Status: AC
  Filled 2022-07-21: qty 1

## 2022-07-21 MED ORDER — DEXAMETHASONE SODIUM PHOSPHATE 10 MG/ML IJ SOLN
10.0000 mg | Freq: Once | INTRAMUSCULAR | Status: AC
  Administered 2022-07-21: 10 mg via INTRAMUSCULAR

## 2022-07-21 MED ORDER — ISOSORB DINITRATE-HYDRALAZINE 20-37.5 MG PO TABS
1.0000 | ORAL_TABLET | Freq: Three times a day (TID) | ORAL | 0 refills | Status: DC
  Filled 2022-07-21: qty 270, 90d supply, fill #0

## 2022-07-21 NOTE — Discharge Instructions (Addendum)
Doxycycline twice daily for the next 7 days to treat infected nasal polyp. I gave you a shot of steroid today to help with swelling to the nose. Follow-up with ear nose and throat on March 21 as directed. If you do have any new or worsening symptoms until then, please go to the nearest emergency department for further workup and evaluation.  Please pick up your amlodipine from the pharmacy and start taking this daily to help with your blood pressure because it is very high today in the clinic.   Return to urgent care as needed. No to dialysis as directed and scheduled.

## 2022-07-21 NOTE — ED Triage Notes (Signed)
Patient here today with c/o a nasal polyps in both nostrils X 1 week. Patient is having difficulty breathing. No fever. He tried a nasal spray but it did not help. Patient states that it has gotten bigger. Patient is coughing but he thinks that the infection in his nose is making him cough.

## 2022-07-21 NOTE — ED Provider Notes (Signed)
[place]    CSN: [id number] Arrival date & time: 07/21/22  1233      History   Chief Complaint Chief Complaint  Patient presents with   Nasal Polyps    HPI [name] is a 52 y.o. male.   Patient presents to urgent care for evaluation of nasal congestion, nasal polyp to bilateral turbinates, cough, and chills that started 1 week ago.  Patient has been seen in the emergency department for this where he was advised to start Flonase nasal spray.  Nasal polyp has grown in size over the last few days and is now obstructing the right nare.  He is unable to spray Flonase nasal spray into the right nare due to obstruction.  There is evidence of purulent congestion and drainage from the nasal polyp to the right turbinate.  Currently with low-grade fever at 100.1.  Reports chills without documented fever at home.  Denies leg swelling and new orthopnea.  History of end-stage renal disease on dialysis.  His dialysis days are Tuesday, Thursday, and Saturday but he missed his dialysis yesterday.  He was hospitalized on July 19, 2022 per chart review at [facility] for dialysis session.  States he has a follow-up appointment with ear nose and throat providers on August 08, 2022 for follow-up.  He is unsure if his shortness of breath is triggered by his inability to breathe through his nose or if he is having shortness of breath due to his lungs.  Cough is dry and nonproductive.  No recent significant changes in his weight to his knowledge.  He is a type II diabetic and states that his blood sugars have been well-controlled in the 100s.  Denies sore throat, headache, ear pain, chest pain, heart palpitations, generalized bodyaches, and known sick contacts with similar symptoms.  No vision changes or dizziness reported.  Patient has not had any over-the-counter medications to help with symptoms other than Flonase and states that it is not helping.  Of note, his blood pressure is very elevated in  the clinic at 203/94.  Amlodipine 10 mg was sent to the pharmacy by ER provider a few days ago with refills.  Advised patient to pick this up and start taking this daily with compliance to help with hypertension.  He is not experiencing any abdominal pain, headache, dizziness, vision changes, or extremity weakness as stated above.  History of noncompliance.     Past Medical History:  Diagnosis Date   Anxiety 02/2019   Anxiety 10/2019   Diabetes mellitus type II, uncontrolled 07/17/2006   Elevated alkaline phosphatase level 01/2019   ESSENTIAL HYPERTENSION 07/17/2006   Homelessness 10/13/2021   HYPERLIPIDEMIA 11/24/2008   Insomnia 02/2019   Left bundle branch block 02/22/2019   Lesion of lip 10/2019   Pneumonia 10/22/2011   Suicidal ideations 02/2019   Vitamin D deficiency 01/2019    Patient Active Problem List   Diagnosis Date Noted   CAP (community acquired pneumonia) 05/20/2022   Chronic combined systolic and diastolic congestive heart failure ([place]) 05/20/2022   Anxiety disorder, unspecified 03/26/2022   Hypocalcemia 03/25/2022   Iron deficiency anemia, unspecified 03/20/2022   CHF (congestive heart failure) ([place]) 11/15/2021   Nausea vomiting and diarrhea 11/07/2021   Homelessness 10/13/2021   Acute respiratory failure with hypoxia ([place]) 10/13/2021   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 ([place]) 09/28/2021   Aspiration pneumonia ([place]) 09/28/2021   Volume overload 04/12/2021   Mild protein-calorie malnutrition ([place]) 03/06/2021  Aftercare including intermittent dialysis ([place]) 02/27/2021   Allergy, unspecified, initial encounter 02/27/2021   Anemia due to chronic kidney disease 02/27/2021   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   Secondary hyperparathyroidism of renal origin ([place]) 02/27/2021   Shortness of breath 02/27/2021   Type 2 diabetes mellitus with other diabetic kidney complication ([place]) [id number]   Hypertensive urgency 02/18/2021   Type 2  diabetes mellitus ([place]) 02/17/2021   Acute on chronic combined systolic and diastolic CHF (congestive heart failure) ([place]) 11/29/2020   Hypertensive crisis 11/27/2020   Stage 4 chronic kidney disease ([place]) 02/22/2020   Left ventricular dysfunction 04/27/2019   Hemoglobin A1C greater than 9%, indicating poor diabetic control 07/29/2018   LBBB (left bundle branch block)    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   Hyperlipidemia LDL goal <70 11/24/2008   DM2 (diabetes mellitus, type 2) ([place]) 07/17/2006   T2DM (type 2 diabetes mellitus) ([place]) 07/17/2006   ESRD (end stage renal disease) ([place]) 07/17/2006   Essential hypertension 07/17/2006    Past Surgical History:  Procedure Laterality Date   ARM SURGERY     AV FISTULA PLACEMENT Right 02/20/2021   Procedure: RIGHT ARTERIOVENOUS GRAFT CREATION;  Surgeon: Angelia Mould, MD;  Location: [facility];  Service: Vascular;  Laterality: Right;   INSERTION OF DIALYSIS CATHETER Left 02/20/2021   Procedure: INSERTION OF DIALYSIS CATHETER USING PALINDROME CATHETER;  Surgeon: Angelia Mould, MD;  Location: [facility];  Service: Vascular;  Laterality: Left;   IR AV DIALY SHUNT INTRO NEEDLE/INTRACATH INITIAL W/PTA/IMG RIGHT Right 11/06/2021   IR REMOVAL TUN CV CATH W/O FL  07/27/2021   IR US GUIDE VASC ACCESS RIGHT  11/06/2021   ULTRASOUND GUIDANCE FOR VASCULAR ACCESS  02/20/2021   Procedure: ULTRASOUND GUIDANCE FOR VASCULAR ACCESS;  Surgeon: Angelia Mould, MD;  Location: [facility];  Service: Vascular;;       Home Medications    Prior to Admission medications   Medication Sig Start Date End Date Taking? Authorizing Provider  acetaminophen (TYLENOL) 500 MG tablet Take 1 tablet (500 mg total) by mouth every 8 (eight) hours as needed for mild pain. 05/28/22  Yes Thurnell Lose, MD  amLODipine (NORVASC) 10 MG tablet Take 1 tablet (10 mg total) by mouth daily. 05/26/22  Yes Thurnell Lose, MD  amLODipine (NORVASC) 10 MG tablet Take 1  tablet (10 mg total) by mouth daily. 06/18/22  Yes   amLODipine (NORVASC) 10 MG tablet Take 1 tablet (10 mg total) by mouth daily. 06/18/22  Yes   ASPIRIN LOW DOSE 81 MG tablet Take 1 tablet (81 mg total) by mouth daily. 05/28/22  Yes Thurnell Lose, MD  blood glucose meter kit and supplies KIT Dispense based on patient and insurance preference. Use up to four times daily as directed. (FOR ICD-9 250.00, 250.01). For QAC - HS accuchecks. 05/25/22  Yes Thurnell Lose, MD  blood glucose meter kit and supplies KIT Dispense based on patient and insurance preference. Use up to four times daily as directed. (FOR ICD-9 250.00, 250.01). For QAC - HS accuchecks. 05/28/22  Yes Thurnell Lose, MD  calcium acetate (PHOSLO) 667 MG capsule Take 3 capsules (2,001 mg total) by mouth 3 (three) times daily. 05/28/22  Yes Thurnell Lose, MD  carvedilol (COREG) 6.25 MG tablet Take 1 tablet (6.25 mg total) by mouth 2 (two) times daily with a meal. 05/28/22  Yes Thurnell Lose, MD  doxycycline (VIBRAMYCIN) 100 MG capsule Take 1 capsule (100  mg total) by mouth 2 (two) times daily for 7 days. 07/21/22 07/28/22 Yes StanhopeStasia Cavalier, FNP  Fingerstix Lancets MISC Use as directed up to 4 times daily. 05/28/22  Yes Thurnell Lose, MD  glucose blood test strip Use to check fasting blood sugar once daily. 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 Insulin dependent 11/29/20  Yes [name], MD  glucose blood test strip Use as directed up to 4 times daily 05/28/22  Yes Thurnell Lose, MD  insulin aspart (NOVOLOG) 100 UNIT/ML FlexPen Before each meal 3 times a day, 140-199 - 2 units, 200-250 - 4 units, 251-299 - 6 units,  300-349 - 8 units,  350 or above 10 units. 05/28/22  Yes Thurnell Lose, MD  insulin detemir (LEVEMIR) 100 UNIT/ML injection Inject 0.35 mLs (35 Units total) into the skin daily. max daily dose 100 units per day 06/18/22  Yes   Insulin Glargine (BASAGLAR KWIKPEN) 100 UNIT/ML Inject 35 Units into the skin daily. Do not exceed 100  units per day. 06/19/22  Yes   insulin glargine (LANTUS SOLOSTAR) 100 UNIT/ML Solostar Pen Inject 35 Units into the skin daily.Not to exceed 100 units per day. 06/19/22  Yes   Insulin Pen Needle 32G X 4 MM MISC Use with insulin pens 05/28/22  Yes Thurnell Lose, MD  Insulin Syringe-Needle U-100 25G X 1" 1 ML MISC For 4 times a day insulin SQ, 1 month supply. Diagnosis E11.65 05/28/22  Yes Thurnell Lose, MD  isosorbide-hydrALAZINE (BIDIL) 20-37.5 MG tablet Take 1 tablet by mouth 3 (three) times daily. 07/20/22  Yes   oxyCODONE-acetaminophen (PERCOCET/ROXICET) 5-325 MG tablet Take 1 tablet by mouth every 12 (twelve) hours as needed for severe pain. 07/04/22  Yes Nanavati, Ankit, MD  PROTONIX 40 MG tablet Take 1 tablet (40 mg total) by mouth daily. 05/25/22  Yes Thurnell Lose, MD  sevelamer carbonate (RENVELA) 800 MG tablet Take 2 tablets (1,600 mg total) by mouth 3 (three) times daily with meals. May also take 1 tablet (800 mg total) 2 (two) times daily as needed with snacks. 05/28/22  Yes Thurnell Lose, MD  sitaGLIPtin (JANUVIA) 25 MG tablet take 1 tablet by mouth daily 02/27/22  Yes   sitaGLIPtin (JANUVIA) 25 MG tablet Take 1 tablet (25 mg total) by mouth daily. 06/18/22  Yes   sodium bicarbonate 650 MG tablet Take 650 mg by mouth daily. 03/19/22  Yes [provider]  traMADol (ULTRAM) 50 MG tablet Take 1 tablet by mouth twice a day as needed for pain 07/15/22  Yes   triamcinolone cream (KENALOG) 0.1 % Apply 1 Application topically 2 (two) times daily. 01/06/22  Yes Nyoka Lint, PA-C  gabapentin (NEURONTIN) 300 MG capsule Take 1 capsule (300 mg total) by mouth 3 (three) times daily. Patient not taking: Reported on 07/27/2019 07/29/18 08/30/19  [name], FNP  sildenafil (VIAGRA) 25 MG tablet Take 1 tablet (25 mg total) by mouth as needed for erectile dysfunction. for erectile dysfunction. Patient not taking: Reported on 08/11/2020 10/13/19 11/27/20  [name], FNP    Family  History Family History  Problem Relation Age of Onset   Hypertension Mother 2   Cancer Mother        bladder cancer   Alcohol abuse Father    Hypertension Brother     Social History Social History   Tobacco Use   Smoking status: Never   Smokeless tobacco: Never  Vaping Use   Vaping Use: Never used  Substance Use Topics   Alcohol use: No    Alcohol/week: 0.0 standard drinks of alcohol   Drug use: No     Allergies   Sulfa antibiotics, Keflex [cephalexin], Other, and Bactrim [sulfamethoxazole-trimethoprim]   Review of Systems Review of Systems Per HPI  Physical Exam Triage Vital Signs ED Triage Vitals [07/21/22 1321]  Enc Vitals Group     BP (!) 203/94     Pulse Rate 95     Resp 18     Temp 100.1 F (37.8 C)     Temp Source Oral     SpO2 94 %     Weight 140 lb (63.5 kg)     Height '5\' 7"'$  (1.702 m)     Head Circumference      Peak Flow      Pain Score 10     Pain Loc      Pain Edu?      Excl. in [place]?    No data found.  Updated Vital Signs BP (!) 203/94 (BP Location: Left Arm)   Pulse 95   Temp 100.1 F (37.8 C) (Oral)   Resp 18   Ht '5\' 7"'$  (1.702 m)   Wt 140 lb (63.5 kg)   SpO2 94%   BMI 21.93 kg/m   Visual Acuity Right Eye Distance:   Left Eye Distance:   Bilateral Distance:    Right Eye Near:   Left Eye Near:    Bilateral Near:     Physical Exam Vitals and nursing note reviewed.  Constitutional:      Appearance: He is not ill-appearing or toxic-appearing.  HENT:     Head: Normocephalic and atraumatic.     Right Ear: Hearing, tympanic membrane, ear canal and external ear normal.     Left Ear: Hearing, tympanic membrane, ear canal and external ear normal.     Nose: Congestion present.     Right Nostril: Occlusion present.     Right Turbinates: Swollen.     Left Turbinates: Swollen.     Comments: Right turbinate is occluded due to nasal polyp and congestion with significant swelling. Left turbinate is patent, however very swollen and  erythematous. Bilateral turbinates with evidence of thick purulent nasal congestion.     Mouth/Throat:     Lips: Pink.     Mouth: Mucous membranes are moist. No injury.     Tongue: No lesions. Tongue does not deviate from midline.     Palate: No mass and lesions.     Pharynx: Oropharynx is clear. Uvula midline. Posterior oropharyngeal erythema present. No pharyngeal swelling, oropharyngeal exudate or uvula swelling.     Tonsils: No tonsillar exudate or tonsillar abscesses.     Comments: Small amount of thick clear/purulent postnasal drainage visualized to the posterior oropharynx.  Eyes:     General: Lids are normal. Vision grossly intact. Gaze aligned appropriately.     Extraocular Movements: Extraocular movements intact.     Conjunctiva/sclera: Conjunctivae normal.  Cardiovascular:     Rate and Rhythm: Normal rate and regular rhythm.     Heart sounds: Normal heart sounds, S1 normal and S2 normal.  Pulmonary:     Effort: Pulmonary effort is normal. No respiratory distress.     Breath sounds: Normal breath sounds and air entry. No wheezing, rhonchi or rales.  Musculoskeletal:     Cervical back: Neck supple.     Right lower leg: No edema.     Left lower leg: No edema.  Lymphadenopathy:  Cervical: Cervical adenopathy present.  Skin:    General: Skin is warm and dry.     Capillary Refill: Capillary refill takes less than 2 seconds.     Findings: No rash.  Neurological:     General: No focal deficit present.     Mental Status: He is alert and oriented to person, place, and time. Mental status is at baseline.     Cranial Nerves: No dysarthria or facial asymmetry.  Psychiatric:        Mood and Affect: Mood normal.        Speech: Speech normal.        Behavior: Behavior normal.        Thought Content: Thought content normal.        Judgment: Judgment normal.      UC Treatments / Results  Labs (all labs ordered are listed, but only abnormal results are displayed) Labs  Reviewed - No data to display  EKG   Radiology No results found.  Procedures Procedures (including critical care time)  Medications Ordered in UC Medications  dexamethasone (DECADRON) injection 10 mg (10 mg Intramuscular Given 07/21/22 1432)    Initial Impression / Assessment and Plan / UC Course  I have reviewed the triage vital signs and the nursing notes.  Pertinent labs & imaging results that were available during my care of the patient were reviewed by me and considered in my medical decision making (see chart for details).   1.  Nasal polyp, bacterial sinusitis Temperature 100.1 in clinic. Patient given tylenol to reduce low grade fever. Nasal polyp visible to the right turbinate appears to be obstructive in etiology, left turbinate is swollen, however non-obstructed. Symptoms have been present for 1 week and he has a low grade fever with significant tenderness to the nose, therefore will treat with doxycycline antibiotic since no renal dosing adjustment is necessary. Dexamethasone injection '10mg'$  given to reduce swelling and inflammation to the bilateral turbinates. He is not a candidate for prednisone Dosepak or burst/NSAID due to ESRD. Does not appear to be hypervolemic currently. Lungs are clear to auscultation without rales, rhonchi, or wheeze, therefore deferred imaging of the chest. Strict ER return precautions discussed, may follow-up with ear nose and throat as scheduled on August 08, 2022.   2. Essential hypertension Advised patient to pick up prescription for amlodipine '10mg'$  sent to pharmacy by ER provider 5 days ago. He has refills of this waiting at the pharmacy. Discussed risks of sustained elevated and untreated BP, he expresses understanding. BP elevated at 203/94, slightly higher than his baseline. No signs of end organ damage or indication for referral to ED at this time. Reinforced renal diet and salt restriction. He expresses understanding and agreement with plan.    Discussed physical exam and available lab work findings in clinic with patient.  Counseled patient regarding appropriate use of medications and potential side effects for all medications recommended or prescribed today. Discussed red flag signs and symptoms of worsening condition,when to call the PCP office, return to urgent care, and when to seek higher level of care in the emergency department. Patient verbalizes understanding and agreement with plan. All questions answered. Patient discharged in stable condition.      Final Clinical Impressions(s) / UC Diagnoses   Final diagnoses:  Nasal polyp  Closed nondisplaced fracture of left clavicle with routine healing, unspecified part of clavicle, subsequent encounter  Bacterial sinusitis  Essential hypertension  ESRD on dialysis [facility])     Discharge Instructions  Doxycycline twice daily for the next 7 days to treat infected nasal polyp. I gave you a shot of steroid today to help with swelling to the nose. Follow-up with ear nose and throat on March 21 as directed. If you do have any new or worsening symptoms until then, please go to the nearest emergency department for further workup and evaluation.  Please pick up your amlodipine from the pharmacy and start taking this daily to help with your blood pressure because it is very high today in the clinic.   Return to urgent care as needed. No to dialysis as directed and scheduled.    ED Prescriptions     Medication Sig Dispense Auth. Provider   doxycycline (VIBRAMYCIN) 100 MG capsule Take 1 capsule (100 mg total) by mouth 2 (two) times daily for 7 days. 14 capsule Talbot Grumbling, FNP      PDMP not reviewed this encounter.   Talbot Grumbling,  07/21/22 2143

## 2022-07-22 ENCOUNTER — Other Ambulatory Visit: Payer: Self-pay

## 2022-07-22 ENCOUNTER — Other Ambulatory Visit (HOSPITAL_COMMUNITY): Payer: Self-pay

## 2022-07-24 ENCOUNTER — Other Ambulatory Visit: Payer: Self-pay

## 2022-08-06 ENCOUNTER — Other Ambulatory Visit: Payer: Self-pay

## 2022-08-08 ENCOUNTER — Other Ambulatory Visit: Payer: Self-pay

## 2022-08-12 ENCOUNTER — Other Ambulatory Visit (HOSPITAL_COMMUNITY): Payer: Self-pay

## 2022-08-12 ENCOUNTER — Other Ambulatory Visit: Payer: Self-pay

## 2022-08-19 DIAGNOSIS — E1122 Type 2 diabetes mellitus with diabetic chronic kidney disease: Secondary | ICD-10-CM | POA: Diagnosis not present

## 2022-08-19 DIAGNOSIS — Z992 Dependence on renal dialysis: Secondary | ICD-10-CM | POA: Diagnosis not present

## 2022-08-19 DIAGNOSIS — N186 End stage renal disease: Secondary | ICD-10-CM | POA: Diagnosis not present

## 2022-08-22 ENCOUNTER — Other Ambulatory Visit: Payer: Self-pay

## 2022-08-24 ENCOUNTER — Emergency Department (HOSPITAL_COMMUNITY)
Admission: EM | Admit: 2022-08-24 | Discharge: 2022-08-25 | Disposition: A | Discharge status: Home / Self Care | Payer: Medicaid Other | Attending: Emergency Medicine | Admitting: Emergency Medicine

## 2022-08-24 ENCOUNTER — Emergency Department (HOSPITAL_COMMUNITY): Payer: Medicaid Other

## 2022-08-24 DIAGNOSIS — I509 Heart failure, unspecified: Secondary | ICD-10-CM | POA: Diagnosis not present

## 2022-08-24 DIAGNOSIS — I159 Secondary hypertension, unspecified: Secondary | ICD-10-CM | POA: Diagnosis not present

## 2022-08-24 DIAGNOSIS — E875 Hyperkalemia: Secondary | ICD-10-CM

## 2022-08-24 DIAGNOSIS — I132 Hypertensive heart and chronic kidney disease with heart failure and with stage 5 chronic kidney disease, or end stage renal disease: Secondary | ICD-10-CM | POA: Insufficient documentation

## 2022-08-24 DIAGNOSIS — Z79899 Other long term (current) drug therapy: Secondary | ICD-10-CM | POA: Insufficient documentation

## 2022-08-24 DIAGNOSIS — N186 End stage renal disease: Secondary | ICD-10-CM | POA: Insufficient documentation

## 2022-08-24 DIAGNOSIS — R0602 Shortness of breath: Secondary | ICD-10-CM | POA: Diagnosis present

## 2022-08-24 DIAGNOSIS — Z7984 Long term (current) use of oral hypoglycemic drugs: Secondary | ICD-10-CM | POA: Insufficient documentation

## 2022-08-24 DIAGNOSIS — R0789 Other chest pain: Secondary | ICD-10-CM | POA: Diagnosis not present

## 2022-08-24 DIAGNOSIS — I120 Hypertensive chronic kidney disease with stage 5 chronic kidney disease or end stage renal disease: Secondary | ICD-10-CM | POA: Diagnosis not present

## 2022-08-24 DIAGNOSIS — R059 Cough, unspecified: Secondary | ICD-10-CM | POA: Diagnosis not present

## 2022-08-24 DIAGNOSIS — Z992 Dependence on renal dialysis: Secondary | ICD-10-CM | POA: Insufficient documentation

## 2022-08-24 DIAGNOSIS — R079 Chest pain, unspecified: Secondary | ICD-10-CM | POA: Diagnosis not present

## 2022-08-24 DIAGNOSIS — Z7982 Long term (current) use of aspirin: Secondary | ICD-10-CM | POA: Insufficient documentation

## 2022-08-24 DIAGNOSIS — Z794 Long term (current) use of insulin: Secondary | ICD-10-CM | POA: Diagnosis not present

## 2022-08-24 DIAGNOSIS — E1122 Type 2 diabetes mellitus with diabetic chronic kidney disease: Secondary | ICD-10-CM | POA: Insufficient documentation

## 2022-08-24 LAB — CBC
HCT: 32.2 % — ABNORMAL LOW (ref 39.0–52.0)
Hemoglobin: 9.9 g/dL — ABNORMAL LOW (ref 13.0–17.0)
MCH: 27.3 pg (ref 26.0–34.0)
MCHC: 30.7 g/dL (ref 30.0–36.0)
MCV: 89 fL (ref 80.0–100.0)
Platelets: 357 10*3/uL (ref 150–400)
RBC: 3.62 MIL/uL — ABNORMAL LOW (ref 4.22–5.81)
RDW: 17.1 % — ABNORMAL HIGH (ref 11.5–15.5)
WBC: 6.6 10*3/uL (ref 4.0–10.5)
nRBC: 0 % (ref 0.0–0.2)

## 2022-08-24 LAB — BRAIN NATRIURETIC PEPTIDE: B Natriuretic Peptide: >4500 pg/mL — ABNORMAL HIGH (ref 0.0–100.0)

## 2022-08-24 LAB — ETHANOL: Alcohol, Ethyl (B): <10 mg/dL (ref <10)

## 2022-08-24 MED ORDER — PROCHLORPERAZINE EDISYLATE 10 MG/2ML IJ SOLN
5.0000 mg | INTRAMUSCULAR | Status: AC
  Administered 2022-08-24: 5 mg via INTRAVENOUS
  Filled 2022-08-24: qty 2

## 2022-08-24 MED ORDER — LIDOCAINE VISCOUS HCL 2 % MT SOLN
15.0000 mL | Freq: Once | OROMUCOSAL | Status: AC
  Administered 2022-08-24: 15 mL via ORAL
  Filled 2022-08-24: qty 15

## 2022-08-24 MED ORDER — HYDRALAZINE HCL 25 MG PO TABS: 25.0000 mg | ORAL_TABLET | Freq: Once | ORAL | Status: DC

## 2022-08-24 MED ORDER — FAMOTIDINE IN NACL 20-0.9 MG/50ML-% IV SOLN
20.0000 mg | Freq: Once | INTRAVENOUS | Status: AC
  Administered 2022-08-24: 20 mg via INTRAVENOUS
  Filled 2022-08-24: qty 50

## 2022-08-24 MED ORDER — ALUM & MAG HYDROXIDE-SIMETH 200-200-20 MG/5ML PO SUSP
30.0000 mL | Freq: Once | ORAL | Status: AC
  Administered 2022-08-24: 30 mL via ORAL
  Filled 2022-08-24: qty 30

## 2022-08-24 MED ORDER — LABETALOL HCL 5 MG/ML IV SOLN
10.0000 mg | Freq: Once | INTRAVENOUS | Status: AC
  Administered 2022-08-24: 10 mg via INTRAVENOUS
  Filled 2022-08-24: qty 4

## 2022-08-24 NOTE — ED Notes (Signed)
This RN went to assess pt, and he is now agreeable to EKG and labs, but wants to remain seated in his wheelchair for comfort. Pt also adds he is [facility] dialysis pt, and missed this past Thursday and today. States he is having central cp that began today, and reporting some sob.

## 2022-08-24 NOTE — ED Notes (Signed)
Pt is refusing to be verbal with triage staff. Pt did nod in agreement when asked to do a blood pressure and pulse ox.

## 2022-08-24 NOTE — ED Notes (Signed)
Pt refused nasal swab. Notified RN

## 2022-08-24 NOTE — ED Provider Notes (Signed)
[facility] AT [facility] Provider Note   CSN: [id number] Arrival date & time: 08/24/22  2004     History {Add pertinent medical, surgical, social history, OB history to HPI:1} Chief Complaint  Patient presents with   Chest Pain    [name] is a 52 y.o. male.  52 year old male with a history of ESRD on Tuesday, Thursday, Saturday dialysis, HTN, DM 2, and CHF who presents emergency department with chest pain.  Says that since yesterday has been having chest pain with eating.  Has difficulty characterizing the pain or describing the location.  Says it is not exertional.  Refuses to answer additional questions including if he has diaphoresis or vomiting associated with it.  Reports no fevers.  Mild shortness of breath and leg swelling bilaterally.  Last dialysis was on Tuesday.  Denies any recreational substance use including alcohol or cocaine.       Home Medications Prior to Admission medications   Medication Sig Start Date End Date Taking? Authorizing Provider  acetaminophen (TYLENOL) 500 MG tablet Take 1 tablet (500 mg total) by mouth every 8 (eight) hours as needed for mild pain. 05/28/22   Leroy Sea, MD  amLODipine (NORVASC) 10 MG tablet Take 1 tablet (10 mg total) by mouth daily. 05/26/22   Leroy Sea, MD  amLODipine (NORVASC) 10 MG tablet Take 1 tablet (10 mg total) by mouth daily. 06/18/22     amLODipine (NORVASC) 10 MG tablet Take 1 tablet (10 mg total) by mouth daily. 06/18/22     ASPIRIN LOW DOSE 81 MG tablet Take 1 tablet (81 mg total) by mouth daily. 05/28/22   Leroy Sea, MD  blood glucose meter kit and supplies KIT Dispense based on patient and insurance preference. Use up to four times daily as directed. (FOR ICD-9 250.00, 250.01). For QAC - HS accuchecks. 05/25/22   Leroy Sea, MD  blood glucose meter kit and supplies KIT Dispense based on patient and insurance preference. Use up to four times daily as directed.  (FOR ICD-9 250.00, 250.01). For QAC - HS accuchecks. 05/28/22   Leroy Sea, MD  calcium acetate (PHOSLO) 667 MG capsule Take 3 capsules (2,001 mg total) by mouth 3 (three) times daily. 05/28/22   Leroy Sea, MD  carvedilol (COREG) 6.25 MG tablet Take 1 tablet (6.25 mg total) by mouth 2 (two) times daily with a meal. 05/28/22   Leroy Sea, MD  Fingerstix Lancets MISC Use as directed up to 4 times daily. 05/28/22   Leroy Sea, MD  glucose blood test strip Use to check fasting blood sugar once daily. 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 Insulin dependent 11/29/20   Hollice Espy, MD  glucose blood test strip Use as directed up to 4 times daily 05/28/22   Leroy Sea, MD  insulin aspart (NOVOLOG) 100 UNIT/ML FlexPen Before each meal 3 times a day, 140-199 - 2 units, 200-250 - 4 units, 251-299 - 6 units,  300-349 - 8 units,  350 or above 10 units. 05/28/22   Leroy Sea, MD  insulin detemir (LEVEMIR) 100 UNIT/ML injection Inject 0.35 mLs (35 Units total) into the skin daily. max daily dose 100 units per day 06/18/22     Insulin Glargine (BASAGLAR KWIKPEN) 100 UNIT/ML Inject 35 Units into the skin daily. Do not exceed 100 units per day. 06/19/22     insulin glargine (LANTUS SOLOSTAR) 100 UNIT/ML Solostar Pen Inject 35 Units into the skin  daily.Not to exceed 100 units per day. 06/19/22     Insulin Pen Needle 32G X 4 MM MISC Use with insulin pens 05/28/22   Leroy Sea, MD  Insulin Syringe-Needle U-100 25G X 1" 1 ML MISC For 4 times a day insulin SQ, 1 month supply. Diagnosis E11.65 05/28/22   Leroy Sea, MD  isosorbide-hydrALAZINE (BIDIL) 20-37.5 MG tablet Take 1 tablet by mouth 3 (three) times daily. 07/20/22     oxyCODONE-acetaminophen (PERCOCET/ROXICET) 5-325 MG tablet Take 1 tablet by mouth every 12 (twelve) hours as needed for severe pain. 07/04/22   Derwood Kaplan, MD  PROTONIX 40 MG tablet Take 1 tablet (40 mg total) by mouth daily. 05/25/22   Leroy Sea, MD  sevelamer carbonate  (RENVELA) 800 MG tablet Take 2 tablets (1,600 mg total) by mouth 3 (three) times daily with meals. May also take 1 tablet (800 mg total) 2 (two) times daily as needed with snacks. 05/28/22   Leroy Sea, MD  sitaGLIPtin (JANUVIA) 25 MG tablet take 1 tablet by mouth daily 02/27/22     sitaGLIPtin (JANUVIA) 25 MG tablet Take 1 tablet (25 mg total) by mouth daily. 06/18/22     sodium bicarbonate 650 MG tablet Take 650 mg by mouth daily. 03/19/22   [provider]  traMADol (ULTRAM) 50 MG tablet Take 1 tablet by mouth twice a day as needed for pain 07/15/22     triamcinolone cream (KENALOG) 0.1 % Apply 1 Application topically 2 (two) times daily. 01/06/22   Ellsworth Lennox, PA-C  gabapentin (NEURONTIN) 300 MG capsule Take 1 capsule (300 mg total) by mouth 3 (three) times daily. Patient not taking: Reported on 07/27/2019 07/29/18 08/30/19  Kallie Locks, FNP  sildenafil (VIAGRA) 25 MG tablet Take 1 tablet (25 mg total) by mouth as needed for erectile dysfunction. for erectile dysfunction. Patient not taking: Reported on 08/11/2020 10/13/19 11/27/20  Kallie Locks, FNP      Allergies    Sulfa antibiotics, Keflex [cephalexin], Other, and Bactrim [sulfamethoxazole-trimethoprim]    Review of Systems   Review of Systems  Physical Exam Updated Vital Signs BP (!) 180/87 (BP Location: Left Arm)   Pulse 80   Resp 18   SpO2 93%  Physical Exam Vitals and nursing note reviewed.  Constitutional:      General: He is not in acute distress.    Appearance: He is well-developed. He is not ill-appearing.     Comments: Uncomfortable appearing.  But was eating during my initial exam.  HENT:     Head: Normocephalic and atraumatic.     Right Ear: External ear normal.     Left Ear: External ear normal.     Nose: Nose normal.  Eyes:     Extraocular Movements: Extraocular movements intact.     Conjunctiva/sclera: Conjunctivae normal.     Pupils: Pupils are equal, round, and reactive to light.   Cardiovascular:     Rate and Rhythm: Normal rate and regular rhythm.     Heart sounds: Normal heart sounds.     Comments: Fistula in right upper extremity with bruit and thrill. Pulmonary:     Effort: Pulmonary effort is normal. No respiratory distress.     Breath sounds: Normal breath sounds.  Abdominal:     General: There is no distension.     Palpations: Abdomen is soft. There is no mass.     Tenderness: There is no abdominal tenderness. There is no guarding.  Musculoskeletal:  Cervical back: Normal range of motion and neck supple.     Right lower leg: Edema present.     Left lower leg: Edema present.  Skin:    General: Skin is warm and dry.  Neurological:     Mental Status: He is alert. Mental status is at baseline.  Psychiatric:        Mood and Affect: Mood normal.        Behavior: Behavior normal.     ED Results / Procedures / Treatments   Labs (all labs ordered are listed, but only abnormal results are displayed) Labs Reviewed  SARS CORONAVIRUS 2 BY RT PCR  CBC  COMPREHENSIVE METABOLIC PANEL  LIPASE, BLOOD  ETHANOL  MAGNESIUM  TROPONIN I (HIGH SENSITIVITY)    EKG EKG Interpretation  Date/Time:  Saturday August 24 2022 20:25:47 EDT Ventricular Rate:  81 PR Interval:  148 QRS Duration: 152 QT Interval:  463 QTC Calculation: 538 R Axis:   111 Text Interpretation: Sinus rhythm LAE, consider biatrial enlargement Consider left ventricular hypertrophy Anterior Q waves, possibly due to LVH Prolonged QT interval Confirmed by Vonita MossPaterson, Nydia Ytuarte [phone]) on 08/24/2022 8:41:20 PM  Radiology No results found.  Procedures Procedures  {Document cardiac monitor, telemetry assessment procedure when appropriate:1}  Medications Ordered in ED Medications  famotidine (PEPCID) IVPB 20 mg premix (has no administration in time range)  alum & mag hydroxide-simeth (MAALOX/MYLANTA) 200-200-20 MG/5ML suspension 30 mL (has no administration in time range)    And  lidocaine  (XYLOCAINE) 2 % viscous mouth solution 15 mL (has no administration in time range)  prochlorperazine (COMPAZINE) injection 5 mg (has no administration in time range)  labetalol (NORMODYNE) injection 10 mg (has no administration in time range)    ED Course/ Medical Decision Making/ A&P   {   Click here for ABCD2, HEART and other calculatorsREFRESH Note before signing :1}                          Medical Decision Making Amount and/or Complexity of Data Reviewed Labs: ordered. Radiology: ordered.  Risk OTC drugs. Prescription drug management.   ***  {Document critical care time when appropriate:1} {Document review of labs and clinical decision tools ie heart score, Chads2Vasc2 etc:1}  {Document your independent review of radiology images, and any outside records:1} {Document your discussion with family members, caretakers, and with consultants:1} {Document social determinants of health affecting pt's care:1} {Document your decision making why or why not admission, treatments were needed:1} Final Clinical Impression(s) / ED Diagnoses Final diagnoses:  None    Rx / DC Orders ED Discharge Orders     None

## 2022-08-24 NOTE — ED Triage Notes (Addendum)
Patient reports complaint of chest pain to registration but will not speak with this RN or tech in triage. Will shake head yes but refusing to talk or answer any questions.

## 2022-08-24 NOTE — ED Notes (Signed)
Pt taken to xray 

## 2022-08-24 NOTE — ED Notes (Signed)
Pt ambulated to the restroom independently 

## 2022-08-25 LAB — COMPREHENSIVE METABOLIC PANEL
ALT: 27 U/L (ref 0–44)
AST: 32 U/L (ref 15–41)
Albumin: 3.4 g/dL — ABNORMAL LOW (ref 3.5–5.0)
Alkaline Phosphatase: 328 U/L — ABNORMAL HIGH (ref 38–126)
Anion gap: 17 — ABNORMAL HIGH (ref 5–15)
BUN: 81 mg/dL — ABNORMAL HIGH (ref 6–20)
CO2: 21 mmol/L — ABNORMAL LOW (ref 22–32)
Calcium: 8.3 mg/dL — ABNORMAL LOW (ref 8.9–10.3)
Chloride: 97 mmol/L — ABNORMAL LOW (ref 98–111)
Creatinine, Ser: 11.19 mg/dL — ABNORMAL HIGH (ref 0.61–1.24)
GFR, Estimated: 5 mL/min — ABNORMAL LOW (ref >60)
Glucose, Bld: 168 mg/dL — ABNORMAL HIGH (ref 70–99)
Potassium: 5.4 mmol/L — ABNORMAL HIGH (ref 3.5–5.1)
Sodium: 135 mmol/L (ref 135–145)
Total Bilirubin: 0.9 mg/dL (ref 0.3–1.2)
Total Protein: 7.5 g/dL (ref 6.5–8.1)

## 2022-08-25 LAB — TROPONIN I (HIGH SENSITIVITY)
Troponin I (High Sensitivity): 163 ng/L (ref <18)
Troponin I (High Sensitivity): 139 ng/L (ref <18)

## 2022-08-25 LAB — MAGNESIUM: Magnesium: 2.5 mg/dL — ABNORMAL HIGH (ref 1.7–2.4)

## 2022-08-25 LAB — LIPASE, BLOOD: Lipase: 34 U/L (ref 11–51)

## 2022-08-25 MED ORDER — SODIUM ZIRCONIUM CYCLOSILICATE 10 G PO PACK
10.0000 g | PACK | Freq: Once | ORAL | Status: DC
  Filled 2022-08-25: qty 1

## 2022-08-25 NOTE — Discharge Instructions (Signed)
We recommended lokelma today for your elevated potassium, which you refused.  This is very dangerous. Nephrology wants you to have a make-up dialysis session on Monday-- you need to call the center first thing in the morning to get chair reserved for you.

## 2022-08-25 NOTE — ED Notes (Signed)
Pt refused Lokelma

## 2022-08-25 NOTE — ED Notes (Signed)
Patient verbalizes understanding of discharge instructions. Opportunity for questioning and answers were provided. Armband removed by staff, pt discharged from ED. Wheeled out to lobby, pt given bus pass and reminded to call dialysis center in the morning for early treatment on Monday.

## 2022-08-25 NOTE — ED Provider Notes (Signed)
   Results for orders placed or performed during the hospital encounter of 08/24/22  CBC  Result Value Ref Range   WBC 6.6 4.0 - 10.5 K/uL   RBC 3.62 (L) 4.22 - 5.81 MIL/uL   Hemoglobin 9.9 (L) 13.0 - 17.0 g/dL   HCT 81.1 (L) 57.2 - 62.0 %   MCV 89.0 80.0 - 100.0 fL   MCH 27.3 26.0 - 34.0 pg   MCHC 30.7 30.0 - 36.0 g/dL   RDW 35.5 (H) 97.4 - 16.3 %   Platelets 357 150 - 400 K/uL   nRBC 0.0 0.0 - 0.2 %  Comprehensive metabolic panel  Result Value Ref Range   Sodium 135 135 - 145 mmol/L   Potassium 5.4 (H) 3.5 - 5.1 mmol/L   Chloride 97 (L) 98 - 111 mmol/L   CO2 21 (L) 22 - 32 mmol/L   Glucose, Bld 168 (H) 70 - 99 mg/dL   BUN 81 (H) 6 - 20 mg/dL   Creatinine, Ser 84.53 (H) 0.61 - 1.24 mg/dL   Calcium 8.3 (L) 8.9 - 10.3 mg/dL   Total Protein 7.5 6.5 - 8.1 g/dL   Albumin 3.4 (L) 3.5 - 5.0 g/dL   AST 32 15 - 41 U/L   ALT 27 0 - 44 U/L   Alkaline Phosphatase 328 (H) 38 - 126 U/L   Total Bilirubin 0.9 0.3 - 1.2 mg/dL   GFR, Estimated 5 (L) >60 mL/min   Anion gap 17 (H) 5 - 15  Lipase, blood  Result Value Ref Range   Lipase 34 11 - 51 U/L  Ethanol  Result Value Ref Range   Alcohol, Ethyl (B) <10 <10 mg/dL  Magnesium  Result Value Ref Range   Magnesium 2.5 (H) 1.7 - 2.4 mg/dL  Brain natriuretic peptide  Result Value Ref Range   B Natriuretic Peptide >4,500.0 (H) 0.0 - 100.0 pg/mL  Troponin I (High Sensitivity)  Result Value Ref Range   Troponin I (High Sensitivity) 163 (HH) <18 ng/L  Troponin I (High Sensitivity)  Result Value Ref Range   Troponin I (High Sensitivity) 139 (HH) <18 ng/L   DG Chest 2 View  Result Date: 08/24/2022 CLINICAL DATA:  Chest pain and cough. EXAM: CHEST - 2 VIEW COMPARISON:  July 19, 2022 FINDINGS: The cardiac silhouette is markedly enlarged and unchanged in size. Mild, stable perihilar pulmonary vascular prominence is seen. There is no evidence of focal consolidation, pleural effusion or pneumothorax. A subacute fracture deformity is seen involving  the distal left clavicle. IMPRESSION: 1. Stable cardiomegaly with chronic central vascular congestion. 2. Subacute fracture of the distal left clavicle. Electronically Signed   By: Aram Candela M.D.   On: 08/24/2022 21:22    1:44 AM Labs with K+ 5.4.  troponins 163, 139 respectively.  Appears similar to prior values.  No acute EKG changes.  As he is not due to HD for another 2 days, discussed with on call neprhology, Dr. Arlean Hopping-- will give dose of lokelma, can have make-up session at HD center Monday-- will need to call first thing in the morning to get chair reserved.  Patient has refused lokelma.  He understands reasoning for ordering this and understands risks of untreated hyperkalemia.  He will call dialysis center on Monday for make-up session.  Can return here for new concerns.   Garlon Hatchet, PA-C 08/25/22 Joan Flores    Tilden Fossa, MD 08/25/22 [phone]

## 2022-08-25 NOTE — ED Notes (Signed)
This RN called for process of labwork, and per lab, there was a delay in [place] results crossing over. Still awaiting CMP results

## 2022-09-05 ENCOUNTER — Emergency Department (HOSPITAL_COMMUNITY)
Admission: EM | Admit: 2022-09-05 | Discharge: 2022-09-06 | Disposition: A | Discharge status: Home / Self Care | Payer: Medicaid Other | Source: Home / Self Care | Attending: Emergency Medicine | Admitting: Emergency Medicine

## 2022-09-05 ENCOUNTER — Emergency Department (HOSPITAL_COMMUNITY): Payer: Medicaid Other

## 2022-09-05 DIAGNOSIS — N186 End stage renal disease: Secondary | ICD-10-CM | POA: Insufficient documentation

## 2022-09-05 DIAGNOSIS — J9601 Acute respiratory failure with hypoxia: Secondary | ICD-10-CM | POA: Diagnosis not present

## 2022-09-05 DIAGNOSIS — J189 Pneumonia, unspecified organism: Secondary | ICD-10-CM | POA: Diagnosis not present

## 2022-09-05 DIAGNOSIS — Z794 Long term (current) use of insulin: Secondary | ICD-10-CM | POA: Insufficient documentation

## 2022-09-05 DIAGNOSIS — R918 Other nonspecific abnormal finding of lung field: Secondary | ICD-10-CM | POA: Diagnosis not present

## 2022-09-05 DIAGNOSIS — R059 Cough, unspecified: Secondary | ICD-10-CM | POA: Diagnosis not present

## 2022-09-05 DIAGNOSIS — R0602 Shortness of breath: Secondary | ICD-10-CM | POA: Insufficient documentation

## 2022-09-05 DIAGNOSIS — E1122 Type 2 diabetes mellitus with diabetic chronic kidney disease: Secondary | ICD-10-CM | POA: Insufficient documentation

## 2022-09-05 DIAGNOSIS — J90 Pleural effusion, not elsewhere classified: Secondary | ICD-10-CM | POA: Diagnosis not present

## 2022-09-05 DIAGNOSIS — Z992 Dependence on renal dialysis: Secondary | ICD-10-CM | POA: Insufficient documentation

## 2022-09-05 DIAGNOSIS — Z79899 Other long term (current) drug therapy: Secondary | ICD-10-CM | POA: Insufficient documentation

## 2022-09-05 DIAGNOSIS — I120 Hypertensive chronic kidney disease with stage 5 chronic kidney disease or end stage renal disease: Secondary | ICD-10-CM | POA: Diagnosis not present

## 2022-09-05 DIAGNOSIS — Z7982 Long term (current) use of aspirin: Secondary | ICD-10-CM | POA: Insufficient documentation

## 2022-09-05 DIAGNOSIS — Z7984 Long term (current) use of oral hypoglycemic drugs: Secondary | ICD-10-CM | POA: Insufficient documentation

## 2022-09-05 DIAGNOSIS — J811 Chronic pulmonary edema: Secondary | ICD-10-CM | POA: Diagnosis not present

## 2022-09-05 DIAGNOSIS — R052 Subacute cough: Secondary | ICD-10-CM | POA: Diagnosis not present

## 2022-09-05 DIAGNOSIS — R0600 Dyspnea, unspecified: Secondary | ICD-10-CM | POA: Diagnosis not present

## 2022-09-05 DIAGNOSIS — J810 Acute pulmonary edema: Secondary | ICD-10-CM | POA: Diagnosis not present

## 2022-09-05 LAB — CBC WITH DIFFERENTIAL/PLATELET
Abs Immature Granulocytes: 0.03 10*3/uL (ref 0.00–0.07)
Basophils Absolute: 0 10*3/uL (ref 0.0–0.1)
Basophils Relative: 1 %
Eosinophils Absolute: 0.2 10*3/uL (ref 0.0–0.5)
Eosinophils Relative: 4 %
HCT: 34 % — ABNORMAL LOW (ref 39.0–52.0)
Hemoglobin: 10.3 g/dL — ABNORMAL LOW (ref 13.0–17.0)
Immature Granulocytes: 1 %
Lymphocytes Relative: 16 %
Lymphs Abs: 1 10*3/uL (ref 0.7–4.0)
MCH: 27.9 pg (ref 26.0–34.0)
MCHC: 30.3 g/dL (ref 30.0–36.0)
MCV: 92.1 fL (ref 80.0–100.0)
Monocytes Absolute: 0.9 10*3/uL (ref 0.1–1.0)
Monocytes Relative: 15 %
Neutro Abs: 3.8 10*3/uL (ref 1.7–7.7)
Neutrophils Relative %: 63 %
Platelets: 348 10*3/uL (ref 150–400)
RBC: 3.69 MIL/uL — ABNORMAL LOW (ref 4.22–5.81)
RDW: 18.3 % — ABNORMAL HIGH (ref 11.5–15.5)
WBC: 6 10*3/uL (ref 4.0–10.5)
nRBC: 0 % (ref 0.0–0.2)

## 2022-09-05 LAB — TROPONIN I (HIGH SENSITIVITY): Troponin I (High Sensitivity): 169 ng/L (ref <18)

## 2022-09-05 NOTE — ED Triage Notes (Addendum)
Pt called EMS today for chest pain and SOB. He has missed 2 full sessions of dialysis and completed 3/4 of dialysis on Saturday before deciding to leave. Lives on street and noncomplaint.  Belly distended and legs edematous.

## 2022-09-06 ENCOUNTER — Emergency Department (HOSPITAL_COMMUNITY): Payer: Medicaid Other

## 2022-09-06 ENCOUNTER — Observation Stay (HOSPITAL_COMMUNITY)
Admission: EM | Admit: 2022-09-06 | Discharge: 2022-09-08 | Disposition: A | Discharge status: Home / Self Care | Payer: Medicaid Other | Attending: Internal Medicine | Admitting: Internal Medicine

## 2022-09-06 ENCOUNTER — Encounter (HOSPITAL_COMMUNITY): Payer: Self-pay

## 2022-09-06 DIAGNOSIS — I152 Hypertension secondary to endocrine disorders: Secondary | ICD-10-CM | POA: Diagnosis present

## 2022-09-06 DIAGNOSIS — I10 Essential (primary) hypertension: Secondary | ICD-10-CM | POA: Diagnosis present

## 2022-09-06 DIAGNOSIS — E119 Type 2 diabetes mellitus without complications: Secondary | ICD-10-CM

## 2022-09-06 DIAGNOSIS — Z79899 Other long term (current) drug therapy: Secondary | ICD-10-CM | POA: Insufficient documentation

## 2022-09-06 DIAGNOSIS — J189 Pneumonia, unspecified organism: Principal | ICD-10-CM | POA: Insufficient documentation

## 2022-09-06 DIAGNOSIS — E1122 Type 2 diabetes mellitus with diabetic chronic kidney disease: Secondary | ICD-10-CM | POA: Insufficient documentation

## 2022-09-06 DIAGNOSIS — J810 Acute pulmonary edema: Secondary | ICD-10-CM

## 2022-09-06 DIAGNOSIS — R0902 Hypoxemia: Secondary | ICD-10-CM | POA: Diagnosis not present

## 2022-09-06 DIAGNOSIS — I120 Hypertensive chronic kidney disease with stage 5 chronic kidney disease or end stage renal disease: Secondary | ICD-10-CM | POA: Insufficient documentation

## 2022-09-06 DIAGNOSIS — N189 Chronic kidney disease, unspecified: Secondary | ICD-10-CM

## 2022-09-06 DIAGNOSIS — J9601 Acute respiratory failure with hypoxia: Secondary | ICD-10-CM | POA: Insufficient documentation

## 2022-09-06 DIAGNOSIS — Z794 Long term (current) use of insulin: Secondary | ICD-10-CM | POA: Insufficient documentation

## 2022-09-06 DIAGNOSIS — Z7982 Long term (current) use of aspirin: Secondary | ICD-10-CM | POA: Insufficient documentation

## 2022-09-06 DIAGNOSIS — N186 End stage renal disease: Secondary | ICD-10-CM | POA: Insufficient documentation

## 2022-09-06 DIAGNOSIS — Z992 Dependence on renal dialysis: Secondary | ICD-10-CM | POA: Insufficient documentation

## 2022-09-06 DIAGNOSIS — R0602 Shortness of breath: Secondary | ICD-10-CM | POA: Diagnosis not present

## 2022-09-06 LAB — I-STAT VENOUS BLOOD GAS, ED
Acid-base deficit: 5 mmol/L — ABNORMAL HIGH (ref 0.0–2.0)
Bicarbonate: 21.9 mmol/L (ref 20.0–28.0)
Calcium, Ion: 1 mmol/L — ABNORMAL LOW (ref 1.15–1.40)
HCT: 36 % — ABNORMAL LOW (ref 39.0–52.0)
Hemoglobin: 12.2 g/dL — ABNORMAL LOW (ref 13.0–17.0)
O2 Saturation: 92 %
Potassium: 4.8 mmol/L (ref 3.5–5.1)
Sodium: 137 mmol/L (ref 135–145)
TCO2: 23 mmol/L (ref 22–32)
pCO2, Ven: 49.9 mmHg (ref 44–60)
pH, Ven: 7.251 (ref 7.25–7.43)
pO2, Ven: 74 mmHg — ABNORMAL HIGH (ref 32–45)

## 2022-09-06 LAB — COMPREHENSIVE METABOLIC PANEL
ALT: 47 U/L — ABNORMAL HIGH (ref 0–44)
AST: 56 U/L — ABNORMAL HIGH (ref 15–41)
Albumin: 3.2 g/dL — ABNORMAL LOW (ref 3.5–5.0)
Alkaline Phosphatase: 475 U/L — ABNORMAL HIGH (ref 38–126)
Anion gap: 14 (ref 5–15)
BUN: 88 mg/dL — ABNORMAL HIGH (ref 6–20)
CO2: 19 mmol/L — ABNORMAL LOW (ref 22–32)
Calcium: 7.4 mg/dL — ABNORMAL LOW (ref 8.9–10.3)
Chloride: 101 mmol/L (ref 98–111)
Creatinine, Ser: 11.11 mg/dL — ABNORMAL HIGH (ref 0.61–1.24)
GFR, Estimated: 5 mL/min — ABNORMAL LOW (ref >60)
Glucose, Bld: 124 mg/dL — ABNORMAL HIGH (ref 70–99)
Potassium: 4.7 mmol/L (ref 3.5–5.1)
Sodium: 134 mmol/L — ABNORMAL LOW (ref 135–145)
Total Bilirubin: 0.7 mg/dL (ref 0.3–1.2)
Total Protein: 7.6 g/dL (ref 6.5–8.1)

## 2022-09-06 LAB — I-STAT CHEM 8, ED
BUN: 89 mg/dL — ABNORMAL HIGH (ref 6–20)
Calcium, Ion: 0.98 mmol/L — ABNORMAL LOW (ref 1.15–1.40)
Chloride: 104 mmol/L (ref 98–111)
Creatinine, Ser: 12 mg/dL — ABNORMAL HIGH (ref 0.61–1.24)
Glucose, Bld: 116 mg/dL — ABNORMAL HIGH (ref 70–99)
HCT: 37 % — ABNORMAL LOW (ref 39.0–52.0)
Hemoglobin: 12.6 g/dL — ABNORMAL LOW (ref 13.0–17.0)
Potassium: 4.8 mmol/L (ref 3.5–5.1)
Sodium: 138 mmol/L (ref 135–145)
TCO2: 23 mmol/L (ref 22–32)

## 2022-09-06 NOTE — Discharge Instructions (Signed)
Please go to your dialysis center today.  If you are unable to get dialysis and have any new symptoms, please return to the ER

## 2022-09-06 NOTE — ED Provider Notes (Incomplete)
[place] EMERGENCY DEPARTMENT AT [facility] Provider Note   CSN: [id number] Arrival date & time: 09/05/22  2237     History {Add pertinent medical, surgical, social history, OB history to HPI:1} Chief Complaint  Patient presents with  . Vascular Access Problem    [name] is a 52 y.o. male.  The history is provided by the patient.  Patient with extensive history including end-stage renal disease on dialysis, hypertension, diabetes, housing insecurity presents with chest pain or shortness of breath.  Patient reports she feels short of breath and there is fluid in his lungs.  He also reports some chest tightness.  He reports he has missed 2 sessions of his dialysis.     Past Medical History:  Diagnosis Date  . Anxiety 02/2019  . Anxiety 10/2019  . Diabetes mellitus type II, uncontrolled 07/17/2006  . Elevated alkaline phosphatase level 01/2019  . ESSENTIAL HYPERTENSION 07/17/2006  . Homelessness 10/13/2021  . HYPERLIPIDEMIA 11/24/2008  . Insomnia 02/2019  . Left bundle branch block 02/22/2019  . Lesion of lip 10/2019  . Pneumonia 10/22/2011  . Suicidal ideations 02/2019  . Vitamin D deficiency 01/2019    Home Medications Prior to Admission medications   Medication Sig Start Date End Date Taking? Authorizing Provider  acetaminophen (TYLENOL) 500 MG tablet Take 1 tablet (500 mg total) by mouth every 8 (eight) hours as needed for mild pain. 05/28/22   Leroy Sea, MD  amLODipine (NORVASC) 10 MG tablet Take 1 tablet (10 mg total) by mouth daily. 05/26/22   Leroy Sea, MD  amLODipine (NORVASC) 10 MG tablet Take 1 tablet (10 mg total) by mouth daily. 06/18/22     amLODipine (NORVASC) 10 MG tablet Take 1 tablet (10 mg total) by mouth daily. 06/18/22     ASPIRIN LOW DOSE 81 MG tablet Take 1 tablet (81 mg total) by mouth daily. 05/28/22   Leroy Sea, MD  blood glucose meter kit and supplies KIT Dispense based on patient and insurance preference. Use up to  four times daily as directed. (FOR ICD-9 250.00, 250.01). For QAC - HS accuchecks. 05/25/22   Leroy Sea, MD  blood glucose meter kit and supplies KIT Dispense based on patient and insurance preference. Use up to four times daily as directed. (FOR ICD-9 250.00, 250.01). For QAC - HS accuchecks. 05/28/22   Leroy Sea, MD  calcium acetate (PHOSLO) 667 MG capsule Take 3 capsules (2,001 mg total) by mouth 3 (three) times daily. 05/28/22   Leroy Sea, MD  carvedilol (COREG) 6.25 MG tablet Take 1 tablet (6.25 mg total) by mouth 2 (two) times daily with a meal. 05/28/22   Leroy Sea, MD  Fingerstix Lancets MISC Use as directed up to 4 times daily. 05/28/22   Leroy Sea, MD  glucose blood test strip Use to check fasting blood sugar once daily. 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 Insulin dependent 11/29/20   Hollice Espy, MD  glucose blood test strip Use as directed up to 4 times daily 05/28/22   Leroy Sea, MD  insulin aspart (NOVOLOG) 100 UNIT/ML FlexPen Before each meal 3 times a day, 140-199 - 2 units, 200-250 - 4 units, 251-299 - 6 units,  300-349 - 8 units,  350 or above 10 units. 05/28/22   Leroy Sea, MD  insulin detemir (LEVEMIR) 100 UNIT/ML injection Inject 0.35 mLs (35 Units total) into the skin daily. max daily dose 100 units per day 06/18/22  Insulin Glargine (BASAGLAR KWIKPEN) 100 UNIT/ML Inject 35 Units into the skin daily. Do not exceed 100 units per day. 06/19/22     insulin glargine (LANTUS SOLOSTAR) 100 UNIT/ML Solostar Pen Inject 35 Units into the skin daily.Not to exceed 100 units per day. 06/19/22     Insulin Pen Needle 32G X 4 MM MISC Use with insulin pens 05/28/22   Leroy Sea, MD  Insulin Syringe-Needle U-100 25G X 1" 1 ML MISC For 4 times a day insulin SQ, 1 month supply. Diagnosis E11.65 05/28/22   Leroy Sea, MD  isosorbide-hydrALAZINE (BIDIL) 20-37.5 MG tablet Take 1 tablet by mouth 3 (three) times daily. 07/20/22     oxyCODONE-acetaminophen  (PERCOCET/ROXICET) 5-325 MG tablet Take 1 tablet by mouth every 12 (twelve) hours as needed for severe pain. 07/04/22   Derwood Kaplan, MD  PROTONIX 40 MG tablet Take 1 tablet (40 mg total) by mouth daily. 05/25/22   Leroy Sea, MD  sevelamer carbonate (RENVELA) 800 MG tablet Take 2 tablets (1,600 mg total) by mouth 3 (three) times daily with meals. May also take 1 tablet (800 mg total) 2 (two) times daily as needed with snacks. 05/28/22   Leroy Sea, MD  sitaGLIPtin (JANUVIA) 25 MG tablet take 1 tablet by mouth daily 02/27/22     sitaGLIPtin (JANUVIA) 25 MG tablet Take 1 tablet (25 mg total) by mouth daily. 06/18/22     sodium bicarbonate 650 MG tablet Take 650 mg by mouth daily. 03/19/22   [provider]  traMADol (ULTRAM) 50 MG tablet Take 1 tablet by mouth twice a day as needed for pain 07/15/22     triamcinolone cream (KENALOG) 0.1 % Apply 1 Application topically 2 (two) times daily. 01/06/22   Ellsworth Lennox, PA-C  gabapentin (NEURONTIN) 300 MG capsule Take 1 capsule (300 mg total) by mouth 3 (three) times daily. Patient not taking: Reported on 07/27/2019 07/29/18 08/30/19  Kallie Locks, FNP  sildenafil (VIAGRA) 25 MG tablet Take 1 tablet (25 mg total) by mouth as needed for erectile dysfunction. for erectile dysfunction. Patient not taking: Reported on 08/11/2020 10/13/19 11/27/20  Kallie Locks, FNP      Allergies    Sulfa antibiotics, Keflex [cephalexin], Other, and Bactrim [sulfamethoxazole-trimethoprim]    Review of Systems   Review of Systems  Constitutional:  Negative for fever.  Respiratory:  Positive for shortness of breath.   Cardiovascular:  Positive for chest pain.    Physical Exam Updated Vital Signs BP (!) 175/96 (BP Location: Left Arm)   Pulse 82   Temp 98.6 F (37 C) (Oral)   Ht 1.702 m ( )   Wt 64 kg   SpO2 98%   BMI 22.10 kg/m  Physical Exam CONSTITUTIONAL: Chronically ill-appearing, sleeping HEAD: Normocephalic/atraumatic EYES:  EOMI/PERRL ENMT: Mucous membranes moist NECK: supple no meningeal signs SPINE/BACK:entire spine nontender CV: S1/S2 noted, no murmurs/rubs/gallops noted LUNGS: Lungs are clear to auscultation bilaterally, no apparent distress ABDOMEN: soft, protuberant but nontender NEURO: Pt is sleeping but easily arousable.  Moves all extremities x 4. EXTREMITIES: pulses normal/equal, full ROM Chronic lower extremity edema is noted Dialysis access to right arm with thrill noted SKIN: warm, color normal  ED Results / Procedures / Treatments   Labs (all labs ordered are listed, but only abnormal results are displayed) Labs Reviewed  CBC WITH DIFFERENTIAL/PLATELET - Abnormal; Notable for the following components:      Result Value   RBC 3.69 (*)    Hemoglobin  10.3 (*)    HCT 34.0 (*)    RDW 18.3 (*)    All other components within normal limits  TROPONIN I (HIGH SENSITIVITY) - Abnormal; Notable for the following components:   Troponin I (High Sensitivity) 169 (*)    All other components within normal limits  COMPREHENSIVE METABOLIC PANEL  I-STAT VENOUS BLOOD GAS, ED  I-STAT CHEM 8, ED    EKG EKG Interpretation  Date/Time:  Thursday September 05 2022 22:39:09 EDT Ventricular Rate:  81 PR Interval:  150 QRS Duration: 148 QT Interval:  452 QTC Calculation: 525 R Axis:   148 Text Interpretation: Normal sinus rhythm Possible Left atrial enlargement Left ventricular hypertrophy with QRS widening ( Cornell product ) Possible Lateral infarct , age undetermined Abnormal ECG Confirmed by Zadie Rhine ([id number]) on 09/05/2022 11:29:01 PM  Radiology DG Chest 2 View  Result Date: 09/05/2022 CLINICAL DATA:  Shortness of breath. EXAM: CHEST - 2 VIEW COMPARISON:  Multiple prior exams most recently 08/24/2022 FINDINGS: Chronic cardiomegaly. Stable mediastinal contours. Similar vascular congestion. Small left pleural effusion. No acute airspace disease. No pneumothorax. Unchanged distal left clavicle fracture.  IMPRESSION: Stable cardiomegaly, vascular congestion, and small left pleural effusion. Electronically Signed   By: Narda Rutherford M.D.   On: 09/05/2022 23:32    Procedures Procedures  {Document cardiac monitor, telemetry assessment procedure when appropriate:1}  Medications Ordered in ED Medications - No data to display  ED Course/ Medical Decision Making/ A&P   {   Click here for ABCD2, HEART and other calculatorsREFRESH Note before signing :1}                          Medical Decision Making Amount and/or Complexity of Data Reviewed Labs: ordered. Radiology: ordered.   This patient presents to the ED for concern of chest pain, this involves an extensive number of treatment options, and is a complaint that carries with it a high risk of complications and morbidity.  The differential diagnosis includes but is not limited to acute coronary syndrome, aortic dissection, pulmonary embolism, pericarditis, pneumothorax, pneumonia, myocarditis, pleurisy, esophageal rupture   Comorbidities that complicate the patient evaluation: Patient's presentation is complicated by their history of end-stage renal disease, hypertension  Social Determinants of Health: Patient's  housing insecurity   increases the complexity of managing their presentation  Additional history obtained: Records reviewed {records:26847}  Lab Tests: I Ordered, and personally interpreted labs.  The pertinent results include:  ***  Imaging Studies ordered: I ordered imaging studies including {imaging:26848}  I independently visualized and interpreted imaging which showed *** I agree with the radiologist interpretation  Cardiac Monitoring: The patient was maintained on a cardiac monitor.  I personally viewed and interpreted the cardiac monitor which showed an underlying rhythm of:  {cardiac monitor:26849}  Medicines ordered and prescription drug management: I ordered medication including ***  for ***  Reevaluation of  the patient after these medicines showed that the patient    {resolved/improved/worsened:23923::"improved"}  Test Considered: Patient is low risk / negative by ***, therefore do not feel that *** is indicated.  Critical Interventions:  .***  Consultations Obtained: I requested consultation with the {consultation:26851}, and discussed  findings as well as pertinent plan - they recommend: ***  Reevaluation: After the interventions noted above, I reevaluated the patient and found that they have :{resolved/improved/worsened:23923::"improved"}  Complexity of problems addressed: Patient's presentation is most consistent with  {UEAV:40981}  Disposition: After consideration of the diagnostic results and the  patient's response to treatment,  I feel that the patent would benefit from {disposition:26850}.     {Document critical care time when appropriate:1} {Document review of labs and clinical decision tools ie heart score, Chads2Vasc2 etc:1}  {Document your independent review of radiology images, and any outside records:1} {Document your discussion with family members, caretakers, and with consultants:1} {Document social determinants of health affecting pt's care:1} {Document your decision making why or why not admission, treatments were needed:1} Final Clinical Impression(s) / ED Diagnoses Final diagnoses:  None    Rx / DC Orders ED Discharge Orders     None

## 2022-09-06 NOTE — ED Notes (Signed)
Pts O2  85% on RA/ pt does not appear to be in any distress/ pulse ox has good pleth

## 2022-09-06 NOTE — ED Provider Triage Note (Signed)
  Emergency Medicine Provider Triage Evaluation Note  MRN:  [id number]  Arrival date & time: 09/06/22    Medically screening exam initiated at 11:44 PM.   CC:   Leg Swelling and Emesis   HPI:  [name] is a 52 y.o. year-old male presents to the ED with chief complaint of sob and hypoxia.  History provided by patient ROS:  -As included in HPI PE:   Vitals:   09/06/22 2236 09/06/22 2238  BP:    Pulse:    Resp:    Temp:    SpO2: (!) 89% (!) 85%    Slow to respond Low o2, starting supplemental   MDM:  Needs a room.  Notified charge and triage.  Patient was informed that the remainder of the evaluation will be completed by another provider, this initial triage assessment does not replace that evaluation, and the importance of remaining in the ED until their evaluation is complete.    Roxy Horseman, PA-C 09/06/22 2345

## 2022-09-06 NOTE — ED Provider Notes (Signed)
[name] EMERGENCY DEPARTMENT AT [facility] Provider Note   CSN: [id number] Arrival date & time: 09/05/22  2237     History  Chief Complaint  Patient presents with   Vascular Access Problem    [name] is a 52 y.o. male.  The history is provided by the patient.  Patient with extensive history including end-stage renal disease on dialysis, hypertension, diabetes, housing insecurity presents with chest pain or shortness of breath.  Patient reports she feels short of breath and there is fluid in his lungs.  He also reports some chest tightness.  He reports he has missed 2 sessions of his dialysis.     Past Medical History:  Diagnosis Date   Anxiety 02/2019   Anxiety 10/2019   Diabetes mellitus type II, uncontrolled 07/17/2006   Elevated alkaline phosphatase level 01/2019   ESSENTIAL HYPERTENSION 07/17/2006   Homelessness 10/13/2021   HYPERLIPIDEMIA 11/24/2008   Insomnia 02/2019   Left bundle branch block 02/22/2019   Lesion of lip 10/2019   Pneumonia 10/22/2011   Suicidal ideations 02/2019   Vitamin D deficiency 01/2019    Home Medications Prior to Admission medications   Medication Sig Start Date End Date Taking? Authorizing Provider  acetaminophen (TYLENOL) 500 MG tablet Take 1 tablet (500 mg total) by mouth every 8 (eight) hours as needed for mild pain. 05/28/22   Leroy Sea, MD  amLODipine (NORVASC) 10 MG tablet Take 1 tablet (10 mg total) by mouth daily. 05/26/22   Leroy Sea, MD  amLODipine (NORVASC) 10 MG tablet Take 1 tablet (10 mg total) by mouth daily. 06/18/22     amLODipine (NORVASC) 10 MG tablet Take 1 tablet (10 mg total) by mouth daily. 06/18/22     ASPIRIN LOW DOSE 81 MG tablet Take 1 tablet (81 mg total) by mouth daily. 05/28/22   Leroy Sea, MD  blood glucose meter kit and supplies KIT Dispense based on patient and insurance preference. Use up to four times daily as directed. (FOR ICD-9 250.00, 250.01). For QAC - HS accuchecks.  05/25/22   Leroy Sea, MD  blood glucose meter kit and supplies KIT Dispense based on patient and insurance preference. Use up to four times daily as directed. (FOR ICD-9 250.00, 250.01). For QAC - HS accuchecks. 05/28/22   Leroy Sea, MD  calcium acetate (PHOSLO) 667 MG capsule Take 3 capsules (2,001 mg total) by mouth 3 (three) times daily. 05/28/22   Leroy Sea, MD  carvedilol (COREG) 6.25 MG tablet Take 1 tablet (6.25 mg total) by mouth 2 (two) times daily with a meal. 05/28/22   Leroy Sea, MD  Fingerstix Lancets MISC Use as directed up to 4 times daily. 05/28/22   Leroy Sea, MD  glucose blood test strip Use to check fasting blood sugar once daily. 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 Insulin dependent 11/29/20   Hollice Espy, MD  glucose blood test strip Use as directed up to 4 times daily 05/28/22   Leroy Sea, MD  insulin aspart (NOVOLOG) 100 UNIT/ML FlexPen Before each meal 3 times a day, 140-199 - 2 units, 200-250 - 4 units, 251-299 - 6 units,  300-349 - 8 units,  350 or above 10 units. 05/28/22   Leroy Sea, MD  insulin detemir (LEVEMIR) 100 UNIT/ML injection Inject 0.35 mLs (35 Units total) into the skin daily. max daily dose 100 units per day 06/18/22     Insulin Glargine (BASAGLAR KWIKPEN) 100 UNIT/ML  Inject 35 Units into the skin daily. Do not exceed 100 units per day. 06/19/22     insulin glargine (LANTUS SOLOSTAR) 100 UNIT/ML Solostar Pen Inject 35 Units into the skin daily.Not to exceed 100 units per day. 06/19/22     Insulin Pen Needle 32G X 4 MM MISC Use with insulin pens 05/28/22   Leroy Sea, MD  Insulin Syringe-Needle U-100 25G X 1" 1 ML MISC For 4 times a day insulin SQ, 1 month supply. Diagnosis E11.65 05/28/22   Leroy Sea, MD  isosorbide-hydrALAZINE (BIDIL) 20-37.5 MG tablet Take 1 tablet by mouth 3 (three) times daily. 07/20/22     oxyCODONE-acetaminophen (PERCOCET/ROXICET) 5-325 MG tablet Take 1 tablet by mouth every 12 (twelve) hours as needed  for severe pain. 07/04/22   Derwood Kaplan, MD  PROTONIX 40 MG tablet Take 1 tablet (40 mg total) by mouth daily. 05/25/22   Leroy Sea, MD  sevelamer carbonate (RENVELA) 800 MG tablet Take 2 tablets (1,600 mg total) by mouth 3 (three) times daily with meals. May also take 1 tablet (800 mg total) 2 (two) times daily as needed with snacks. 05/28/22   Leroy Sea, MD  sitaGLIPtin (JANUVIA) 25 MG tablet take 1 tablet by mouth daily 02/27/22     sitaGLIPtin (JANUVIA) 25 MG tablet Take 1 tablet (25 mg total) by mouth daily. 06/18/22     sodium bicarbonate 650 MG tablet Take 650 mg by mouth daily. 03/19/22   [provider]  traMADol (ULTRAM) 50 MG tablet Take 1 tablet by mouth twice a day as needed for pain 07/15/22     triamcinolone cream (KENALOG) 0.1 % Apply 1 Application topically 2 (two) times daily. 01/06/22   Ellsworth Lennox, PA-C  gabapentin (NEURONTIN) 300 MG capsule Take 1 capsule (300 mg total) by mouth 3 (three) times daily. Patient not taking: Reported on 07/27/2019 07/29/18 08/30/19  Kallie Locks, FNP  sildenafil (VIAGRA) 25 MG tablet Take 1 tablet (25 mg total) by mouth as needed for erectile dysfunction. for erectile dysfunction. Patient not taking: Reported on 08/11/2020 10/13/19 11/27/20  Kallie Locks, FNP      Allergies    Sulfa antibiotics, Keflex [cephalexin], Other, and Bactrim [sulfamethoxazole-trimethoprim]    Review of Systems   Review of Systems  Constitutional:  Negative for fever.  Respiratory:  Positive for shortness of breath.   Cardiovascular:  Positive for chest pain.    Physical Exam Updated Vital Signs BP (!) 169/93   Pulse 79   Temp 98 F (36.7 C)   Resp 19   Ht 1.702 m ( )   Wt 64 kg   SpO2 96%   BMI 22.10 kg/m  Physical Exam CONSTITUTIONAL: Chronically ill-appearing, sleeping HEAD: Normocephalic/atraumatic EYES: EOMI/PERRL ENMT: Mucous membranes moist NECK: supple no meningeal signs SPINE/BACK:entire spine nontender CV:  S1/S2 noted, no murmurs/rubs/gallops noted LUNGS: Lungs are clear to auscultation bilaterally, no apparent distress ABDOMEN: soft, protuberant but nontender NEURO: Pt is sleeping but easily arousable.  Moves all extremities x 4. EXTREMITIES: pulses normal/equal, full ROM Chronic lower extremity edema is noted Dialysis access to right arm with thrill noted SKIN: warm, color normal  ED Results / Procedures / Treatments   Labs (all labs ordered are listed, but only abnormal results are displayed) Labs Reviewed  CBC WITH DIFFERENTIAL/PLATELET - Abnormal; Notable for the following components:      Result Value   RBC 3.69 (*)    Hemoglobin 10.3 (*)    HCT 34.0 (*)  RDW 18.3 (*)    All other components within normal limits  COMPREHENSIVE METABOLIC PANEL - Abnormal; Notable for the following components:   Sodium 134 (*)    CO2 19 (*)    Glucose, Bld 124 (*)    BUN 88 (*)    Creatinine, Ser 11.11 (*)    Calcium 7.4 (*)    Albumin 3.2 (*)    AST 56 (*)    ALT 47 (*)    Alkaline Phosphatase 475 (*)    GFR, Estimated 5 (*)    All other components within normal limits  I-STAT VENOUS BLOOD GAS, ED - Abnormal; Notable for the following components:   pO2, Ven 74 (*)    Acid-base deficit 5.0 (*)    Calcium, Ion 1.00 (*)    HCT 36.0 (*)    Hemoglobin 12.2 (*)    All other components within normal limits  I-STAT CHEM 8, ED - Abnormal; Notable for the following components:   BUN 89 (*)    Creatinine, Ser 12.00 (*)    Glucose, Bld 116 (*)    Calcium, Ion 0.98 (*)    Hemoglobin 12.6 (*)    HCT 37.0 (*)    All other components within normal limits  TROPONIN I (HIGH SENSITIVITY) - Abnormal; Notable for the following components:   Troponin I (High Sensitivity) 169 (*)    All other components within normal limits    EKG EKG Interpretation  Date/Time:  Thursday September 05 2022 22:39:09 EDT Ventricular Rate:  81 PR Interval:  150 QRS Duration: 148 QT Interval:  452 QTC  Calculation: 525 R Axis:   148 Text Interpretation: Normal sinus rhythm Possible Left atrial enlargement Left ventricular hypertrophy with QRS widening ( Cornell product ) Possible Lateral infarct , age undetermined Abnormal ECG Confirmed by Zadie Rhine ([id number]) on 09/05/2022 11:29:01 PM  Radiology DG Chest 2 View  Result Date: 09/05/2022 CLINICAL DATA:  Shortness of breath. EXAM: CHEST - 2 VIEW COMPARISON:  Multiple prior exams most recently 08/24/2022 FINDINGS: Chronic cardiomegaly. Stable mediastinal contours. Similar vascular congestion. Small left pleural effusion. No acute airspace disease. No pneumothorax. Unchanged distal left clavicle fracture. IMPRESSION: Stable cardiomegaly, vascular congestion, and small left pleural effusion. Electronically Signed   By: Narda Rutherford M.D.   On: 09/05/2022 23:32    Procedures Procedures    Medications Ordered in ED Medications - No data to display  ED Course/ Medical Decision Making/ A&P Clinical Course as of 09/06/22 0053  Fri Sep 06, 2022  0050 Creatinine(!): 11.11 Chronic renal failure [DW]  0051 Hemoglobin(!): 10.3 Anemia [DW]  0051 Patient presents for his 30th ER visit in 6 months.  He has missed multiple sessions of dialysis.  He reports having some chest tightness earlier but has been resting comfortably no acute distress.  He has a chronically elevated troponin.  Terms of his chest pain, I have low suspicion for ACS/PE/dissection at this time.  Patient is in no distress. [DW]  [phone] Patient is without hyperkalemia and no signs of volume overload or hypoxia.  He is safe for discharge and he will go to his dialysis center first thing in the morning [DW]    Clinical Course User Index [DW] Zadie Rhine, MD                             Medical Decision Making Amount and/or Complexity of Data Reviewed Labs: ordered. Decision-making details documented in ED Course.  Radiology: ordered.   This patient presents to the ED for  concern of chest pain, this involves an extensive number of treatment options, and is a complaint that carries with it a high risk of complications and morbidity.  The differential diagnosis includes but is not limited to acute coronary syndrome, aortic dissection, pulmonary embolism, pericarditis, pneumothorax, pneumonia, myocarditis, pleurisy, esophageal rupture   Comorbidities that complicate the patient evaluation: Patient's presentation is complicated by their history of end-stage renal disease, hypertension  Social Determinants of Health: Patient's  housing insecurity   increases the complexity of managing their presentation  Additional history obtained: Records reviewed Care Everywhere/External Records recent admission to hospital in [place]  Lab Tests: I Ordered, and personally interpreted labs.  The pertinent results include: Anemia, chronically elevated troponin  Imaging Studies ordered: I ordered imaging studies including X-ray chest   I independently visualized and interpreted imaging which showed cardiomegaly I agree with the radiologist interpretation  Cardiac Monitoring: The patient was maintained on a cardiac monitor.  I personally viewed and interpreted the cardiac monitor which showed an underlying rhythm of:  sinus rhythm   Reevaluation: After the interventions noted above, I reevaluated the patient and found that they have :stayed the same  Complexity of problems addressed: Patient's presentation is most consistent with  acute presentation with potential threat to life or bodily function  Disposition: After consideration of the diagnostic results and the patient's response to treatment,  I feel that the patent would benefit from discharge   .           Final Clinical Impression(s) / ED Diagnoses Final diagnoses:  ESRD (end stage renal disease)    Rx / DC Orders ED Discharge Orders     None         Zadie Rhine, MD 09/06/22 [phone]

## 2022-09-06 NOTE — ED Triage Notes (Signed)
POV/ came from dialysis- from self concern/ vomited around 1500 after dialysis/ leg swelling (chronic)/ pt was found sleeping  in lobby by this nurse and pt said " hold on a minute", so this nurse let pt sleep.

## 2022-09-07 ENCOUNTER — Other Ambulatory Visit: Payer: Self-pay

## 2022-09-07 ENCOUNTER — Emergency Department (HOSPITAL_COMMUNITY): Payer: Medicaid Other

## 2022-09-07 DIAGNOSIS — R918 Other nonspecific abnormal finding of lung field: Secondary | ICD-10-CM | POA: Diagnosis not present

## 2022-09-07 LAB — COMPREHENSIVE METABOLIC PANEL
ALT: 42 U/L (ref 0–44)
AST: 44 U/L — ABNORMAL HIGH (ref 15–41)
Albumin: 3.1 g/dL — ABNORMAL LOW (ref 3.5–5.0)
Alkaline Phosphatase: 382 U/L — ABNORMAL HIGH (ref 38–126)
Anion gap: 14 (ref 5–15)
BUN: 46 mg/dL — ABNORMAL HIGH (ref 6–20)
CO2: 23 mmol/L (ref 22–32)
Calcium: 7.9 mg/dL — ABNORMAL LOW (ref 8.9–10.3)
Chloride: 98 mmol/L (ref 98–111)
Creatinine, Ser: 7.3 mg/dL — ABNORMAL HIGH (ref 0.61–1.24)
GFR, Estimated: 8 mL/min — ABNORMAL LOW (ref >60)
Glucose, Bld: 188 mg/dL — ABNORMAL HIGH (ref 70–99)
Potassium: 3.9 mmol/L (ref 3.5–5.1)
Sodium: 135 mmol/L (ref 135–145)
Total Bilirubin: 0.9 mg/dL (ref 0.3–1.2)
Total Protein: 7.4 g/dL (ref 6.5–8.1)

## 2022-09-07 LAB — CBC WITH DIFFERENTIAL/PLATELET
Abs Immature Granulocytes: 0.03 10*3/uL (ref 0.00–0.07)
Basophils Absolute: 0 10*3/uL (ref 0.0–0.1)
Basophils Relative: 1 %
Eosinophils Absolute: 0.1 10*3/uL (ref 0.0–0.5)
Eosinophils Relative: 1 %
HCT: 29.5 % — ABNORMAL LOW (ref 39.0–52.0)
Hemoglobin: 9.5 g/dL — ABNORMAL LOW (ref 13.0–17.0)
Immature Granulocytes: 0 %
Lymphocytes Relative: 7 %
Lymphs Abs: 0.6 10*3/uL — ABNORMAL LOW (ref 0.7–4.0)
MCH: 27.9 pg (ref 26.0–34.0)
MCHC: 32.2 g/dL (ref 30.0–36.0)
MCV: 86.8 fL (ref 80.0–100.0)
Monocytes Absolute: 0.9 10*3/uL (ref 0.1–1.0)
Monocytes Relative: 11 %
Neutro Abs: 6.9 10*3/uL (ref 1.7–7.7)
Neutrophils Relative %: 80 %
Platelets: 345 10*3/uL (ref 150–400)
RBC: 3.4 MIL/uL — ABNORMAL LOW (ref 4.22–5.81)
RDW: 17.8 % — ABNORMAL HIGH (ref 11.5–15.5)
WBC: 8.6 10*3/uL (ref 4.0–10.5)
nRBC: 0 % (ref 0.0–0.2)

## 2022-09-07 LAB — CBG MONITORING, ED: Glucose-Capillary: 135 mg/dL — ABNORMAL HIGH (ref 70–99)

## 2022-09-07 LAB — LACTIC ACID, PLASMA
Lactic Acid, Venous: 1.1 mmol/L (ref 0.5–1.9)
Lactic Acid, Venous: 1.3 mmol/L (ref 0.5–1.9)

## 2022-09-07 LAB — PROTIME-INR
INR: 1.3 — ABNORMAL HIGH (ref 0.8–1.2)
Prothrombin Time: 15.9 seconds — ABNORMAL HIGH (ref 11.4–15.2)

## 2022-09-07 LAB — HEPATITIS B SURFACE ANTIGEN: Hepatitis B Surface Ag: NONREACTIVE

## 2022-09-07 LAB — APTT: aPTT: 32 seconds (ref 24–36)

## 2022-09-07 MED ORDER — CHLORHEXIDINE GLUCONATE CLOTH 2 % EX PADS: 6.0000 | MEDICATED_PAD | Freq: Every day | CUTANEOUS | Status: DC

## 2022-09-07 MED ORDER — PENTAFLUOROPROP-TETRAFLUOROETH EX AERO: 1.0000 | INHALATION_SPRAY | CUTANEOUS | Status: DC | PRN

## 2022-09-07 MED ORDER — LIDOCAINE-PRILOCAINE 2.5-2.5 % EX CREA: 1.0000 | TOPICAL_CREAM | CUTANEOUS | Status: DC | PRN

## 2022-09-07 MED ORDER — LIDOCAINE HCL (PF) 1 % IJ SOLN: 5.0000 mL | INTRAMUSCULAR | Status: DC | PRN

## 2022-09-07 MED ORDER — ONDANSETRON HCL 4 MG/2ML IJ SOLN
4.0000 mg | Freq: Once | INTRAMUSCULAR | Status: AC
  Administered 2022-09-07: 4 mg via INTRAVENOUS
  Filled 2022-09-07: qty 2

## 2022-09-07 MED ORDER — HEPARIN SODIUM (PORCINE) 1000 UNIT/ML DIALYSIS
2000.0000 [IU] | INTRAMUSCULAR | Status: DC | PRN
  Administered 2022-09-07: 2000 [IU] via INTRAVENOUS_CENTRAL
  Filled 2022-09-07 (×2): qty 2

## 2022-09-07 MED ORDER — LEVOFLOXACIN IN D5W 750 MG/150ML IV SOLN
750.0000 mg | Freq: Once | INTRAVENOUS | Status: AC
  Administered 2022-09-08: 750 mg via INTRAVENOUS
  Filled 2022-09-07: qty 150

## 2022-09-07 MED ORDER — ACETAMINOPHEN 500 MG PO TABS
1000.0000 mg | ORAL_TABLET | ORAL | Status: AC
  Filled 2022-09-07: qty 2

## 2022-09-07 NOTE — ED Provider Notes (Signed)
[place] EMERGENCY DEPARTMENT AT [facility] Provider Note   CSN: [id number] Arrival date & time: 09/06/22  2110     History  Chief Complaint  Patient presents with   Leg Swelling   Emesis    [name] is a 52 y.o. male.   Emesis Patient is a 52 year old male with past medical history significant for DM2, HTN, anxiety, homelessness, insomnia  He is present emergency room today with complaints of nausea and vomiting.  Was found to be hypoxic and was placed on oxygen and brought to room.  Patient denies any symptoms currently and is declining any nausea medicine.     Home Medications Prior to Admission medications   Medication Sig Start Date End Date Taking? Authorizing Provider  acetaminophen (TYLENOL) 500 MG tablet Take 1 tablet (500 mg total) by mouth every 8 (eight) hours as needed for mild pain. 05/28/22   Leroy Sea, MD  amLODipine (NORVASC) 10 MG tablet Take 1 tablet (10 mg total) by mouth daily. 05/26/22   Leroy Sea, MD  amLODipine (NORVASC) 10 MG tablet Take 1 tablet (10 mg total) by mouth daily. 06/18/22     amLODipine (NORVASC) 10 MG tablet Take 1 tablet (10 mg total) by mouth daily. 06/18/22     ASPIRIN LOW DOSE 81 MG tablet Take 1 tablet (81 mg total) by mouth daily. 05/28/22   Leroy Sea, MD  blood glucose meter kit and supplies KIT Dispense based on patient and insurance preference. Use up to four times daily as directed. (FOR ICD-9 250.00, 250.01). For QAC - HS accuchecks. 05/25/22   Leroy Sea, MD  blood glucose meter kit and supplies KIT Dispense based on patient and insurance preference. Use up to four times daily as directed. (FOR ICD-9 250.00, 250.01). For QAC - HS accuchecks. 05/28/22   Leroy Sea, MD  calcium acetate (PHOSLO) 667 MG capsule Take 3 capsules (2,001 mg total) by mouth 3 (three) times daily. 05/28/22   Leroy Sea, MD  carvedilol (COREG) 6.25 MG tablet Take 1 tablet (6.25 mg total) by mouth 2 (two)  times daily with a meal. 05/28/22   Leroy Sea, MD  Fingerstix Lancets MISC Use as directed up to 4 times daily. 05/28/22   Leroy Sea, MD  glucose blood test strip Use to check fasting blood sugar once daily. 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 Insulin dependent 11/29/20   Hollice Espy, MD  glucose blood test strip Use as directed up to 4 times daily 05/28/22   Leroy Sea, MD  insulin aspart (NOVOLOG) 100 UNIT/ML FlexPen Before each meal 3 times a day, 140-199 - 2 units, 200-250 - 4 units, 251-299 - 6 units,  300-349 - 8 units,  350 or above 10 units. 05/28/22   Leroy Sea, MD  insulin detemir (LEVEMIR) 100 UNIT/ML injection Inject 0.35 mLs (35 Units total) into the skin daily. max daily dose 100 units per day 06/18/22     Insulin Glargine (BASAGLAR KWIKPEN) 100 UNIT/ML Inject 35 Units into the skin daily. Do not exceed 100 units per day. 06/19/22     insulin glargine (LANTUS SOLOSTAR) 100 UNIT/ML Solostar Pen Inject 35 Units into the skin daily.Not to exceed 100 units per day. 06/19/22     Insulin Pen Needle 32G X 4 MM MISC Use with insulin pens 05/28/22   Leroy Sea, MD  Insulin Syringe-Needle U-100 25G X 1" 1 ML MISC For 4 times a day insulin  SQ, 1 month supply. Diagnosis E11.65 05/28/22   Leroy Sea, MD  isosorbide-hydrALAZINE (BIDIL) 20-37.5 MG tablet Take 1 tablet by mouth 3 (three) times daily. 07/20/22     oxyCODONE-acetaminophen (PERCOCET/ROXICET) 5-325 MG tablet Take 1 tablet by mouth every 12 (twelve) hours as needed for severe pain. 07/04/22   Derwood Kaplan, MD  PROTONIX 40 MG tablet Take 1 tablet (40 mg total) by mouth daily. 05/25/22   Leroy Sea, MD  sevelamer carbonate (RENVELA) 800 MG tablet Take 2 tablets (1,600 mg total) by mouth 3 (three) times daily with meals. May also take 1 tablet (800 mg total) 2 (two) times daily as needed with snacks. 05/28/22   Leroy Sea, MD  sitaGLIPtin (JANUVIA) 25 MG tablet take 1 tablet by mouth daily 02/27/22     sitaGLIPtin  (JANUVIA) 25 MG tablet Take 1 tablet (25 mg total) by mouth daily. 06/18/22     sodium bicarbonate 650 MG tablet Take 650 mg by mouth daily. 03/19/22   [provider]  traMADol (ULTRAM) 50 MG tablet Take 1 tablet by mouth twice a day as needed for pain 07/15/22     triamcinolone cream (KENALOG) 0.1 % Apply 1 Application topically 2 (two) times daily. 01/06/22   Ellsworth Lennox, PA-C  gabapentin (NEURONTIN) 300 MG capsule Take 1 capsule (300 mg total) by mouth 3 (three) times daily. Patient not taking: Reported on 07/27/2019 07/29/18 08/30/19  Kallie Locks, FNP  sildenafil (VIAGRA) 25 MG tablet Take 1 tablet (25 mg total) by mouth as needed for erectile dysfunction. for erectile dysfunction. Patient not taking: Reported on 08/11/2020 10/13/19 11/27/20  Kallie Locks, FNP      Allergies    Sulfa antibiotics, Keflex [cephalexin], Other, and Bactrim [sulfamethoxazole-trimethoprim]    Review of Systems   Review of Systems  Gastrointestinal:  Positive for vomiting.    Physical Exam Updated Vital Signs BP 137/76   Pulse 79   Temp 99.5 F (37.5 C)   Resp 11   Wt 73 kg   SpO2 94%   BMI 25.21 kg/m  Physical Exam Vitals and nursing note reviewed.  Constitutional:      General: He is not in acute distress.    Comments: 52 year old male in no acute distress sleeping in bed, awakens to verbal stimulus and is irritable  HENT:     Head: Normocephalic and atraumatic.     Nose: Nose normal.     Mouth/Throat:     Mouth: Mucous membranes are moist.  Eyes:     General: No scleral icterus. Cardiovascular:     Rate and Rhythm: Normal rate and regular rhythm.     Pulses: Normal pulses.     Heart sounds: Normal heart sounds.  Pulmonary:     Effort: Pulmonary effort is normal. No respiratory distress.     Breath sounds: Rales present. No wheezing.     Comments: Rales Abdominal:     Palpations: Abdomen is soft.     Tenderness: There is no abdominal tenderness. There is no guarding or  rebound.  Musculoskeletal:     Cervical back: Normal range of motion.     Right lower leg: No edema.     Left lower leg: No edema.  Skin:    General: Skin is warm and dry.     Capillary Refill: Capillary refill takes less than 2 seconds.  Neurological:     Mental Status: He is alert. Mental status is at baseline.  Psychiatric:  Mood and Affect: Mood normal.        Behavior: Behavior normal.     ED Results / Procedures / Treatments   Labs (all labs ordered are listed, but only abnormal results are displayed) Labs Reviewed  COMPREHENSIVE METABOLIC PANEL - Abnormal; Notable for the following components:      Result Value   Glucose, Bld 188 (*)    BUN 46 (*)    Creatinine, Ser 7.30 (*)    Calcium 7.9 (*)    Albumin 3.1 (*)    AST 44 (*)    Alkaline Phosphatase 382 (*)    GFR, Estimated 8 (*)    All other components within normal limits  CBC WITH DIFFERENTIAL/PLATELET - Abnormal; Notable for the following components:   RBC 3.40 (*)    Hemoglobin 9.5 (*)    HCT 29.5 (*)    RDW 17.8 (*)    Lymphs Abs 0.6 (*)    All other components within normal limits  PROTIME-INR - Abnormal; Notable for the following components:   Prothrombin Time 15.9 (*)    INR 1.3 (*)    All other components within normal limits  CULTURE, BLOOD (ROUTINE X 2)  CULTURE, BLOOD (ROUTINE X 2)  RESP PANEL BY RT-PCR (RSV, FLU A&B, COVID)  RVPGX2  LACTIC ACID, PLASMA  APTT  LACTIC ACID, PLASMA  URINALYSIS, W/ REFLEX TO CULTURE (INFECTION SUSPECTED)    EKG None  Radiology DG Chest Port 1 View  Result Date: 09/07/2022 CLINICAL DATA:  Questionable sepsis EXAM: PORTABLE CHEST 1 VIEW COMPARISON:  09/05/2022 FINDINGS: Cardiomegaly with vascular congestion. Bilateral perihilar and lower lobe opacities could reflect edema or pneumonia. No effusions. No acute bony abnormality. IMPRESSION: Cardiomegaly, vascular congestion. Perihilar and lower lobe opacities bilaterally could reflect edema or infection.  Electronically Signed   By: Charlett Nose M.D.   On: 09/07/2022 01:12   DG Chest 2 View  Result Date: 09/05/2022 CLINICAL DATA:  Shortness of breath. EXAM: CHEST - 2 VIEW COMPARISON:  Multiple prior exams most recently 08/24/2022 FINDINGS: Chronic cardiomegaly. Stable mediastinal contours. Similar vascular congestion. Small left pleural effusion. No acute airspace disease. No pneumothorax. Unchanged distal left clavicle fracture. IMPRESSION: Stable cardiomegaly, vascular congestion, and small left pleural effusion. Electronically Signed   By: Narda Rutherford M.D.   On: 09/05/2022 23:32    Procedures .Critical Care  Performed by: Gailen Shelter, PA Authorized by: Gailen Shelter, PA   Critical care provider statement:    Critical care time (minutes):  35   Critical care time was exclusive of:  Separately billable procedures and treating other patients and teaching time   Critical care was necessary to treat or prevent imminent or life-threatening deterioration of the following conditions:  Respiratory failure   Critical care was time spent personally by me on the following activities:  Development of treatment plan with patient or surrogate, review of old charts, re-evaluation of patient's condition, pulse oximetry, ordering and review of radiographic studies, ordering and review of laboratory studies, ordering and performing treatments and interventions, obtaining history from patient or surrogate, examination of patient and evaluation of patient's response to treatment   Care discussed with: admitting provider       Medications Ordered in ED Medications  ondansetron (ZOFRAN) injection 4 mg (has no administration in time range)  acetaminophen (TYLENOL) tablet 1,000 mg (1,000 mg Oral Not Given 09/07/22 0341)    ED Course/ Medical Decision Making/ A&P Clinical Course as of 09/07/22 0539  Sat Sep 07, 2022  0519 Discussed w Dr. Thedore Mins who will arrange dialysis.  Anticipate DC after [WF]     Clinical Course User Index [WF] Gailen Shelter, [place]                             Medical Decision Making Risk OTC drugs. Prescription drug management.   This patient presents to the ED for concern of shortness of breath, this involves a number of treatment options, and is a complaint that carries with it a moderate to high risk of complications and morbidity. A differential diagnosis was considered for the patient's symptoms which is discussed below:   The causes for shortness of breath include but are not limited to Cardiac (AHF, pericardial effusion and tamponade, arrhythmias, ischemia, etc) Respiratory (COPD, asthma, pneumonia, pneumothorax, primary pulmonary hypertension, PE/VQ mismatch) Hematological (anemia) Neuromuscular (ALS, Guillain-Barr, etc)    Co morbidities: Discussed in HPI   Brief History:  Patient is a 52 year old male with past medical history significant for DM2, HTN, anxiety, homelessness, insomnia  He is present emergency room today with complaints of nausea and vomiting.  Was found to be hypoxic and was placed on oxygen and brought to room.  Patient denies any symptoms currently and is declining any nausea medicine.    EMR reviewed including pt PMHx, past surgical history and past visits to ER.   See HPI for more details   Lab Tests:   I ordered and independently interpreted labs. Labs notable for Labs without remarkable changes.  Hemoglobin at 9.5 decreased from 3 days ago and last CBC was obtained however this is in setting of volume overload I suspect dilutional anemia in this particular case.  Lactate within normal limits, patient declines respiratory panel  Imaging Studies:  Abnormal findings. I personally reviewed all imaging studies. Imaging notable for  Patient is afebrile, nontachycardic and not tachypneic.  Chronically ill but not acutely toxic appearing.  No leukocytosis.  Overall suspect that presentation is fluid overload and not  related to a pneumonia I also have low suspicion for PE, tamponade, other acute emergent disease.  Cardiac Monitoring:  The patient was maintained on a cardiac monitor.  I personally viewed and interpreted the cardiac monitored which showed an underlying rhythm of:  NSR EKG non-ischemic    Medicines ordered:  I ordered medication including Tylenol and Zofran for nausea Reevaluation of the patient after these medicines showed that the patient stayed the same I have reviewed the patients home medicines and have made adjustments as needed Patient declined medications  Critical Interventions:   oxygenation and discussion with nephrology for dialysis   Consults/Attending Physician   I requested consultation with Dr. Thedore Mins,  and discussed lab and imaging findings as well as pertinent plan - they recommend: Dialysis   Reevaluation:  After the interventions noted above I re-evaluated patient and found that they have :stayed the same   Social Determinants of Health:  The patient's social determinants of health were a factor in the care of this patient    Problem List / ED Course:  Respiratory failure due to volume overload.  Discussed with nephrology who will dialyze today.  I do not suspect that the patient will require admission.  If hypoxia has resolved after hemodialysis anticipate that he will be a good candidate for discharge home.   Dispostion:  After consideration of the diagnostic results and the patients response to treatment, I feel that the patent would  benefit from dialysis -- reassess after -- I anticipate home after dialysis.   Final Clinical Impression(s) / ED Diagnoses Final diagnoses:  Hypoxia  Acute pulmonary edema    Rx / DC Orders ED Discharge Orders     None         Gailen Shelter, [place] 09/07/22 0544    Marily Memos, MD 09/07/22 [phone]

## 2022-09-07 NOTE — ED Notes (Signed)
Patient willing to tolerate blow-by oxygen, sats improved to 97%. Patient refusing to wear cardiac monitoring and inquired about lunch. RN informed patient that it should be arriving soon and dialysis should be taking him in the next hour or so.

## 2022-09-07 NOTE — ED Notes (Signed)
Pt resting comfortably, rise and fall of chest noted.  

## 2022-09-07 NOTE — ED Notes (Signed)
Patient removed O2 nasal canula from nose and refuses to put it back. 

## 2022-09-07 NOTE — ED Notes (Signed)
Patient removed O2 nasal canula from nose and refuses to put it back.

## 2022-09-07 NOTE — ED Notes (Signed)
Pt refusing to wear any monitoring devices at this time. MD made aware.

## 2022-09-07 NOTE — ED Notes (Signed)
Patient refused respiratory panel swab.

## 2022-09-07 NOTE — Discharge Instructions (Addendum)
Thank you for coming to [facility] Emergency Department. You were seen for shortness of breath. You received dialysis and your symptoms improved. We have provided shelter resources and social services resources.   Please follow up with your primary care provider within 1 week. Please attend your dialysis as originally scheduled.  Do not hesitate to return to the ED or call 911 if you experience: -Worsening symptoms -Lightheadedness, passing out -Fevers/chills -Anything else that concerns you

## 2022-09-07 NOTE — ED Notes (Signed)
RN went into patients room to attempt to place patient back on oxygen when oxygen saturation reading 70%. Patient refusing to place it back in nose. Swatted away RNs hand and said he is taking a break from it. EDP made aware.

## 2022-09-07 NOTE — Progress Notes (Signed)
POST HD TX NOTE  09/07/22 1802  Vitals  Temp 98.4 F (36.9 C)  Temp Source Oral  BP (!) 164/94  MAP (mmHg) 113  BP Location Left Arm  BP Method Automatic  Patient Position (if appropriate) Lying  Pulse Rate 87  Pulse Rate Source Monitor  ECG Heart Rate 87  Resp 11  Oxygen Therapy  SpO2 92 %  O2 Device Room Air  Pulse Oximetry Type Continuous  During Treatment Monitoring  Intra-Hemodialysis Comments (S)   (post HD tx VS check)  Post Treatment  Dialyzer Clearance Lightly streaked  Duration of HD Treatment -hour(s) 3.5 hour(s)  Hemodialysis Intake (mL) 0 mL  Liters Processed 84  Fluid Removed (mL) 4000 mL  Tolerated HD Treatment Yes  Post-Hemodialysis Comments (S)  tx completed w/o problem other than pt being totally uncooperative throughout tx. UF goal met, blood rinsed back, VSS. Medication Admin:  Heparin  2000 unit bolus  AVG/AVF Arterial Site Held (minutes) 8 minutes  Fistula / Graft Right Forearm  No placement date or time found.   Placed prior to admission: Yes  Orientation: Right  Access Location: Forearm  Site Condition No complications  Fistula / Graft Assessment Bruit;Thrill;Present  Drainage Description None

## 2022-09-07 NOTE — Procedures (Signed)
I was present at this dialysis session. I have reviewed the session itself and made appropriate changes.   Filed Weights   09/06/22 2235 09/07/22 0732  Weight: 73 kg 73 kg    Recent Labs  Lab 09/07/22 0010  NA 135  K 3.9  CL 98  CO2 23  GLUCOSE 188*  BUN 46*  CREATININE 7.30*  CALCIUM 7.9*    Recent Labs  Lab 09/05/22 2245 09/06/22 0010 09/06/22 0011 09/07/22 0010  WBC 6.0  --   --  8.6  NEUTROABS 3.8  --   --  6.9  HGB 10.3* 12.6* 12.2* 9.5*  HCT 34.0* 37.0* 36.0* 29.5*  MCV 92.1  --   --  86.8  PLT 348  --   --  345    Scheduled Meds:  acetaminophen  1,000 mg Oral STAT   Chlorhexidine Gluconate Cloth  6 each Topical Q0600   Continuous Infusions: PRN Meds:.heparin, lidocaine (PF), lidocaine-prilocaine, pentafluoroprop-tetrafluoroeth   Anthony Sar, MD [facility] 09/07/2022, 2:50 PM

## 2022-09-07 NOTE — ED Notes (Signed)
Patient has taken O2 nasal canula out of his nose and refuses to put it back.

## 2022-09-07 NOTE — Progress Notes (Signed)
Presented to the ER for n/v, hypoxia secondary to pulm edema/vasc congestion. ESRD at [facility] MWF. Had HD yesterday, previous HD before that was last Saturday. Spotty compliance with HD. Patient will have HD today and discharge from ER thereafter if deemed stable by ER.  Outpatient orders: [facility], TTS.  F1 80.  4 hrs. 2K/2.5 calcium.  Flow rates 400/800.  EDW 61.5 kg.  Post treatment weight on 08/1972.3 kg.  AVG RUE.  Meds: Mircera 100 mcg every 2 weeks (last dose 4/19), Venofer 100 mg (last dose 4/19), calcitriol 0.25 mcg every treatment, heparin 2000 units bolus.  Patient seen and examined in ER. Resting comfortably, laying flat. Some trace edema b/l LE's. Denies any recent issues with AVG  Will arrange for HD today. Would be okay with discharge post-HD from a nephro perspective after ER evaluates. Please call with any questions/concerns.  Anthony Sar, MD [facility]

## 2022-09-07 NOTE — ED Notes (Signed)
Pt refusing to cooperate to obtain vitals. Lying in bed with hand in pants will not remove to check pulse ox and will not speak to this RN. MD Jearld Fenton to bedside.

## 2022-09-07 NOTE — ED Notes (Addendum)
Pt continuing to refuse to allow this RN to check pulse ox, laying with hand in pants, eyes closed. MD Jearld Fenton made aware. Pt resting comfortably, rise and fall of chest noted.

## 2022-09-07 NOTE — ED Provider Notes (Signed)
[facility] AT [facility] Provider Note   CSN: [id number] Arrival date & time: 09/06/22  2110     History {Add pertinent medical, surgical, social history, OB history to HPI:1} Chief Complaint  Patient presents with  . Leg Swelling  . Emesis    [name] is a 52 y.o. male with ESRD on HD TuThSat, T2DM, HLD, HTN, IDA, combined diastolic/systolic HF, and homelessness who presents with leg swelling, emesis, shortness of breath, hypoxia.  Per chart review, patient presented for shortness of breath and hypoxia in the setting of missing dialysis.  He was taken to dialysis and return to the emergency department for reevaluation and likely discharge. He presents frequently to the ED.   On reevaluation after dialysis, patient states he is tired and does not want to talk to me.  He is extremely uncooperative.  Patient denies any pain anywhere, denies shortness of breath at this time, but states that his problem is that he is homeless and has nowhere to go.  He states he does not want to go to the [facility].    Emesis      Home Medications Prior to Admission medications   Medication Sig Start Date End Date Taking? Authorizing Provider  acetaminophen (TYLENOL) 500 MG tablet Take 1 tablet (500 mg total) by mouth every 8 (eight) hours as needed for mild pain. 05/28/22   Leroy Sea, MD  amLODipine (NORVASC) 10 MG tablet Take 1 tablet (10 mg total) by mouth daily. 05/26/22   Leroy Sea, MD  amLODipine (NORVASC) 10 MG tablet Take 1 tablet (10 mg total) by mouth daily. 06/18/22     amLODipine (NORVASC) 10 MG tablet Take 1 tablet (10 mg total) by mouth daily. 06/18/22     ASPIRIN LOW DOSE 81 MG tablet Take 1 tablet (81 mg total) by mouth daily. 05/28/22   Leroy Sea, MD  blood glucose meter kit and supplies KIT Dispense based on patient and insurance preference. Use up to four times daily as directed. (FOR ICD-9 250.00, 250.01). For QAC - HS accuchecks.  05/25/22   Leroy Sea, MD  blood glucose meter kit and supplies KIT Dispense based on patient and insurance preference. Use up to four times daily as directed. (FOR ICD-9 250.00, 250.01). For QAC - HS accuchecks. 05/28/22   Leroy Sea, MD  calcium acetate (PHOSLO) 667 MG capsule Take 3 capsules (2,001 mg total) by mouth 3 (three) times daily. 05/28/22   Leroy Sea, MD  carvedilol (COREG) 6.25 MG tablet Take 1 tablet (6.25 mg total) by mouth 2 (two) times daily with a meal. 05/28/22   Leroy Sea, MD  Fingerstix Lancets MISC Use as directed up to 4 times daily. 05/28/22   Leroy Sea, MD  glucose blood test strip Use to check fasting blood sugar once daily. 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 Insulin dependent 11/29/20   Hollice Espy, MD  glucose blood test strip Use as directed up to 4 times daily 05/28/22   Leroy Sea, MD  insulin aspart (NOVOLOG) 100 UNIT/ML FlexPen Before each meal 3 times a day, 140-199 - 2 units, 200-250 - 4 units, 251-299 - 6 units,  300-349 - 8 units,  350 or above 10 units. 05/28/22   Leroy Sea, MD  insulin detemir (LEVEMIR) 100 UNIT/ML injection Inject 0.35 mLs (35 Units total) into the skin daily. max daily dose 100 units per day 06/18/22     Insulin Glargine (  BASAGLAR KWIKPEN) 100 UNIT/ML Inject 35 Units into the skin daily. Do not exceed 100 units per day. 06/19/22     insulin glargine (LANTUS SOLOSTAR) 100 UNIT/ML Solostar Pen Inject 35 Units into the skin daily.Not to exceed 100 units per day. 06/19/22     Insulin Pen Needle 32G X 4 MM MISC Use with insulin pens 05/28/22   Leroy Sea, MD  Insulin Syringe-Needle U-100 25G X 1" 1 ML MISC For 4 times a day insulin SQ, 1 month supply. Diagnosis E11.65 05/28/22   Leroy Sea, MD  isosorbide-hydrALAZINE (BIDIL) 20-37.5 MG tablet Take 1 tablet by mouth 3 (three) times daily. 07/20/22     oxyCODONE-acetaminophen (PERCOCET/ROXICET) 5-325 MG tablet Take 1 tablet by mouth every 12 (twelve) hours as needed  for severe pain. 07/04/22   Derwood Kaplan, MD  PROTONIX 40 MG tablet Take 1 tablet (40 mg total) by mouth daily. 05/25/22   Leroy Sea, MD  sevelamer carbonate (RENVELA) 800 MG tablet Take 2 tablets (1,600 mg total) by mouth 3 (three) times daily with meals. May also take 1 tablet (800 mg total) 2 (two) times daily as needed with snacks. 05/28/22   Leroy Sea, MD  sitaGLIPtin (JANUVIA) 25 MG tablet take 1 tablet by mouth daily 02/27/22     sitaGLIPtin (JANUVIA) 25 MG tablet Take 1 tablet (25 mg total) by mouth daily. 06/18/22     sodium bicarbonate 650 MG tablet Take 650 mg by mouth daily. 03/19/22   [provider]  traMADol (ULTRAM) 50 MG tablet Take 1 tablet by mouth twice a day as needed for pain 07/15/22     triamcinolone cream (KENALOG) 0.1 % Apply 1 Application topically 2 (two) times daily. 01/06/22   Ellsworth Lennox, PA-C  gabapentin (NEURONTIN) 300 MG capsule Take 1 capsule (300 mg total) by mouth 3 (three) times daily. Patient not taking: Reported on 07/27/2019 07/29/18 08/30/19  Kallie Locks, FNP  sildenafil (VIAGRA) 25 MG tablet Take 1 tablet (25 mg total) by mouth as needed for erectile dysfunction. for erectile dysfunction. Patient not taking: Reported on 08/11/2020 10/13/19 11/27/20  Kallie Locks, FNP      Allergies    Sulfa antibiotics, Keflex [cephalexin], Other, and Bactrim [sulfamethoxazole-trimethoprim]    Review of Systems   Review of Systems  Gastrointestinal:  Positive for vomiting.   Review of systems Negative for CP, SOB.  A 10 point review of systems was performed and is negative unless otherwise reported in HPI.  Physical Exam Updated Vital Signs BP (!) 164/94 (BP Location: Left Arm)   Pulse 87   Temp 98.4 F (36.9 C) (Oral)   Resp 11   Ht 5\' 7"  (1.702 m)   Wt 73 kg   SpO2 92%   BMI 25.21 kg/m  Physical Exam General: Normal appearing male, lying in bed.  HEENT: PERRLA, Sclera anicteric, MMM, trachea midline.  Cardiology: RRR, no  murmurs/rubs/gallops. BL radial and DP pulses equal bilaterally.  Resp: Normal respiratory rate and effort. CTAB, no wheezes, rhonchi, crackles.  Abd: Soft, non-tender, non-distended. No rebound tenderness or guarding.  GU: Deferred. MSK: No peripheral edema or signs of trauma. Extremities without deformity or TTP. No cyanosis or clubbing. Skin: warm, dry. No rashes or lesions. Back: No CVA tenderness Neuro: A&Ox4, CNs II-XII grossly intact. MAEs. Sensation grossly intact.  Psych: Normal mood and affect.   ED Results / Procedures / Treatments   Labs (all labs ordered are listed, but only abnormal results are  displayed) Labs Reviewed  COMPREHENSIVE METABOLIC PANEL - Abnormal; Notable for the following components:      Result Value   Glucose, Bld 188 (*)    BUN 46 (*)    Creatinine, Ser 7.30 (*)    Calcium 7.9 (*)    Albumin 3.1 (*)    AST 44 (*)    Alkaline Phosphatase 382 (*)    GFR, Estimated 8 (*)    All other components within normal limits  CBC WITH DIFFERENTIAL/PLATELET - Abnormal; Notable for the following components:   RBC 3.40 (*)    Hemoglobin 9.5 (*)    HCT 29.5 (*)    RDW 17.8 (*)    Lymphs Abs 0.6 (*)    All other components within normal limits  PROTIME-INR - Abnormal; Notable for the following components:   Prothrombin Time 15.9 (*)    INR 1.3 (*)    All other components within normal limits  CBG MONITORING, ED - Abnormal; Notable for the following components:   Glucose-Capillary 135 (*)    All other components within normal limits  CULTURE, BLOOD (ROUTINE X 2)  CULTURE, BLOOD (ROUTINE X 2)  RESP PANEL BY RT-PCR (RSV, FLU A&B, COVID)  RVPGX2  LACTIC ACID, PLASMA  LACTIC ACID, PLASMA  APTT  HEPATITIS B SURFACE ANTIGEN  URINALYSIS, W/ REFLEX TO CULTURE (INFECTION SUSPECTED)  HEPATITIS B SURFACE ANTIBODY, QUANTITATIVE    EKG EKG Interpretation  Date/Time:  Saturday September 07 2022 00:31:49 EDT Ventricular Rate:  83 PR Interval:  149 QRS  Duration: 157 QT Interval:  471 QTC Calculation: 554 R Axis:   147 Text Interpretation: Sinus rhythm Biatrial enlargement Consider left ventricular hypertrophy Prolonged QT interval No significant change since last tracing Confirmed by Elayne Snare ([id number]) on 09/07/2022 7:27:54 AM  Radiology DG Chest Port 1 View  Result Date: 09/07/2022 CLINICAL DATA:  Questionable sepsis EXAM: PORTABLE CHEST 1 VIEW COMPARISON:  09/05/2022 FINDINGS: Cardiomegaly with vascular congestion. Bilateral perihilar and lower lobe opacities could reflect edema or pneumonia. No effusions. No acute bony abnormality. IMPRESSION: Cardiomegaly, vascular congestion. Perihilar and lower lobe opacities bilaterally could reflect edema or infection. Electronically Signed   By: Charlett Nose M.D.   On: 09/07/2022 01:12   DG Chest 2 View  Result Date: 09/05/2022 CLINICAL DATA:  Shortness of breath. EXAM: CHEST - 2 VIEW COMPARISON:  Multiple prior exams most recently 08/24/2022 FINDINGS: Chronic cardiomegaly. Stable mediastinal contours. Similar vascular congestion. Small left pleural effusion. No acute airspace disease. No pneumothorax. Unchanged distal left clavicle fracture. IMPRESSION: Stable cardiomegaly, vascular congestion, and small left pleural effusion. Electronically Signed   By: Narda Rutherford M.D.   On: 09/05/2022 23:32    Procedures Procedures  {Document cardiac monitor, telemetry assessment procedure when appropriate:1}  Medications Ordered in ED Medications  acetaminophen (TYLENOL) tablet 1,000 mg (1,000 mg Oral Not Given 09/07/22 0341)  Chlorhexidine Gluconate Cloth 2 % PADS 6 each (0 each Topical Hold 09/07/22 1238)  ondansetron (ZOFRAN) injection 4 mg (4 mg Intravenous Given 09/07/22 1610)    ED Course/ Medical Decision Making/ A&P                          Medical Decision Making Risk OTC drugs. Prescription drug management.    This patient presents to the ED for concern of ***, this involves an  extensive number of treatment options, and is a complaint that carries with it a high risk of complications and morbidity.  I considered  the following differential and admission for this acute, potentially life threatening condition.   MDM:    ***  Clinical Course as of 09/07/22 2106  Sat Sep 07, 2022  [id number] Discussed w Dr. Thedore Mins who will arrange dialysis.  Anticipate DC after [WF]  2035 Pt is reevaluated. He is not tachypneic, resting comfortably. CTAB on lung auscultation. Pt uncooperative with RN in allowing her to obtain full set of vitals. He states his only problem now is that he doesn' thave a place to go. I empathize with patient for his living situation but relay that we cannot keep him here just for that.  [HN]    Clinical Course User Index [HN] Loetta Rough, MD [WF] [facility], [place]    Labs: I Ordered, and personally interpreted labs.  The pertinent results include:  ***  Imaging Studies ordered: I ordered imaging studies including *** I independently visualized and interpreted imaging. I agree with the radiologist interpretation  Additional history obtained from chart review.    Reevaluation: After the interventions noted above, I reevaluated the patient and found that they have :{resolved/improved/worsened:23923::"improved"}  Social Determinants of Health: .***  Disposition:  ***  Co morbidities that complicate the patient evaluation . Past Medical History:  Diagnosis Date  . Anxiety 02/2019  . Anxiety 10/2019  . Diabetes mellitus type II, uncontrolled 07/17/2006  . Elevated alkaline phosphatase level 01/2019  . ESSENTIAL HYPERTENSION 07/17/2006  . Homelessness 10/13/2021  . HYPERLIPIDEMIA 11/24/2008  . Insomnia 02/2019  . Left bundle branch block 02/22/2019  . Lesion of lip 10/2019  . Pneumonia 10/22/2011  . Suicidal ideations 02/2019  . Vitamin D deficiency 01/2019     Medicines Meds ordered this encounter  Medications  . ondansetron (ZOFRAN)  injection 4 mg  . acetaminophen (TYLENOL) tablet 1,000 mg  . Chlorhexidine Gluconate Cloth 2 % PADS 6 each  . DISCONTD: pentafluoroprop-tetrafluoroeth (GEBAUERS) aerosol 1 Application  . DISCONTD: lidocaine (PF) (XYLOCAINE) 1 % injection 5 mL  . DISCONTD: lidocaine-prilocaine (EMLA) cream 1 Application  . DISCONTD: heparin injection 2,000 Units    I have reviewed the patients home medicines and have made adjustments as needed  Problem List / ED Course: Problem List Items Addressed This Visit   None Visit Diagnoses     Hypoxia    -  Primary   Acute pulmonary edema                {Document critical care time when appropriate:1} {Document review of labs and clinical decision tools ie heart score, Chads2Vasc2 etc:1}  {Document your independent review of radiology images, and any outside records:1} {Document your discussion with family members, caretakers, and with consultants:1} {Document social determinants of health affecting pt's care:1} {Document your decision making why or why not admission, treatments were needed:1}  This note was created using dictation software, which may contain spelling or grammatical errors.

## 2022-09-08 ENCOUNTER — Emergency Department (HOSPITAL_COMMUNITY)
Admission: EM | Admit: 2022-09-08 | Discharge: 2022-09-09 | Disposition: A | Discharge status: Home / Self Care | Payer: Medicaid Other | Attending: Emergency Medicine | Admitting: Emergency Medicine

## 2022-09-08 ENCOUNTER — Encounter (HOSPITAL_COMMUNITY): Payer: Self-pay | Admitting: *Deleted

## 2022-09-08 ENCOUNTER — Emergency Department (HOSPITAL_COMMUNITY): Payer: Medicaid Other

## 2022-09-08 ENCOUNTER — Other Ambulatory Visit: Payer: Self-pay

## 2022-09-08 ENCOUNTER — Encounter (HOSPITAL_COMMUNITY): Payer: Self-pay | Admitting: Internal Medicine

## 2022-09-08 DIAGNOSIS — I10 Essential (primary) hypertension: Secondary | ICD-10-CM | POA: Diagnosis not present

## 2022-09-08 DIAGNOSIS — Z5900 Homelessness unspecified: Secondary | ICD-10-CM | POA: Insufficient documentation

## 2022-09-08 DIAGNOSIS — J9601 Acute respiratory failure with hypoxia: Secondary | ICD-10-CM

## 2022-09-08 DIAGNOSIS — R052 Subacute cough: Secondary | ICD-10-CM | POA: Diagnosis not present

## 2022-09-08 DIAGNOSIS — E1122 Type 2 diabetes mellitus with diabetic chronic kidney disease: Secondary | ICD-10-CM | POA: Diagnosis not present

## 2022-09-08 DIAGNOSIS — J189 Pneumonia, unspecified organism: Secondary | ICD-10-CM

## 2022-09-08 DIAGNOSIS — N186 End stage renal disease: Secondary | ICD-10-CM | POA: Diagnosis not present

## 2022-09-08 DIAGNOSIS — I120 Hypertensive chronic kidney disease with stage 5 chronic kidney disease or end stage renal disease: Secondary | ICD-10-CM | POA: Insufficient documentation

## 2022-09-08 DIAGNOSIS — Z992 Dependence on renal dialysis: Secondary | ICD-10-CM | POA: Diagnosis not present

## 2022-09-08 DIAGNOSIS — Z794 Long term (current) use of insulin: Secondary | ICD-10-CM | POA: Insufficient documentation

## 2022-09-08 DIAGNOSIS — Z79899 Other long term (current) drug therapy: Secondary | ICD-10-CM | POA: Insufficient documentation

## 2022-09-08 DIAGNOSIS — Z862 Personal history of diseases of the blood and blood-forming organs and certain disorders involving the immune mechanism: Secondary | ICD-10-CM

## 2022-09-08 DIAGNOSIS — R600 Localized edema: Secondary | ICD-10-CM | POA: Diagnosis not present

## 2022-09-08 DIAGNOSIS — J811 Chronic pulmonary edema: Secondary | ICD-10-CM | POA: Diagnosis not present

## 2022-09-08 DIAGNOSIS — R059 Cough, unspecified: Secondary | ICD-10-CM | POA: Diagnosis not present

## 2022-09-08 DIAGNOSIS — Z7982 Long term (current) use of aspirin: Secondary | ICD-10-CM | POA: Diagnosis not present

## 2022-09-08 DIAGNOSIS — R051 Acute cough: Secondary | ICD-10-CM | POA: Insufficient documentation

## 2022-09-08 DIAGNOSIS — J810 Acute pulmonary edema: Secondary | ICD-10-CM

## 2022-09-08 DIAGNOSIS — R0600 Dyspnea, unspecified: Secondary | ICD-10-CM | POA: Diagnosis not present

## 2022-09-08 DIAGNOSIS — N189 Chronic kidney disease, unspecified: Secondary | ICD-10-CM

## 2022-09-08 DIAGNOSIS — E119 Type 2 diabetes mellitus without complications: Secondary | ICD-10-CM

## 2022-09-08 LAB — COMPREHENSIVE METABOLIC PANEL
ALT: 54 U/L — ABNORMAL HIGH (ref 0–44)
AST: 73 U/L — ABNORMAL HIGH (ref 15–41)
Albumin: 2.8 g/dL — ABNORMAL LOW (ref 3.5–5.0)
Alkaline Phosphatase: 341 U/L — ABNORMAL HIGH (ref 38–126)
Anion gap: 13 (ref 5–15)
BUN: 33 mg/dL — ABNORMAL HIGH (ref 6–20)
CO2: 24 mmol/L (ref 22–32)
Calcium: 7.9 mg/dL — ABNORMAL LOW (ref 8.9–10.3)
Chloride: 95 mmol/L — ABNORMAL LOW (ref 98–111)
Creatinine, Ser: 5.9 mg/dL — ABNORMAL HIGH (ref 0.61–1.24)
GFR, Estimated: 11 mL/min — ABNORMAL LOW (ref >60)
Glucose, Bld: 216 mg/dL — ABNORMAL HIGH (ref 70–99)
Potassium: 3.9 mmol/L (ref 3.5–5.1)
Sodium: 132 mmol/L — ABNORMAL LOW (ref 135–145)
Total Bilirubin: 0.8 mg/dL (ref 0.3–1.2)
Total Protein: 7 g/dL (ref 6.5–8.1)

## 2022-09-08 LAB — CBC WITH DIFFERENTIAL/PLATELET
Abs Immature Granulocytes: 0.03 10*3/uL (ref 0.00–0.07)
Basophils Absolute: 0 10*3/uL (ref 0.0–0.1)
Basophils Relative: 0 %
Eosinophils Absolute: 0 10*3/uL (ref 0.0–0.5)
Eosinophils Relative: 1 %
HCT: 29.3 % — ABNORMAL LOW (ref 39.0–52.0)
Hemoglobin: 9.3 g/dL — ABNORMAL LOW (ref 13.0–17.0)
Immature Granulocytes: 0 %
Lymphocytes Relative: 9 %
Lymphs Abs: 0.7 10*3/uL (ref 0.7–4.0)
MCH: 27.6 pg (ref 26.0–34.0)
MCHC: 31.7 g/dL (ref 30.0–36.0)
MCV: 86.9 fL (ref 80.0–100.0)
Monocytes Absolute: 0.8 10*3/uL (ref 0.1–1.0)
Monocytes Relative: 11 %
Neutro Abs: 5.9 10*3/uL (ref 1.7–7.7)
Neutrophils Relative %: 79 %
Platelets: 322 10*3/uL (ref 150–400)
RBC: 3.37 MIL/uL — ABNORMAL LOW (ref 4.22–5.81)
RDW: 17.5 % — ABNORMAL HIGH (ref 11.5–15.5)
WBC: 7.4 10*3/uL (ref 4.0–10.5)
nRBC: 0 % (ref 0.0–0.2)

## 2022-09-08 LAB — PHOSPHORUS: Phosphorus: 3.6 mg/dL (ref 2.5–4.6)

## 2022-09-08 LAB — CULTURE, BLOOD (ROUTINE X 2): Culture: NO GROWTH

## 2022-09-08 LAB — HEMOGLOBIN A1C
Hgb A1c MFr Bld: 7.7 % — ABNORMAL HIGH (ref 4.8–5.6)
Mean Plasma Glucose: 174.29 mg/dL

## 2022-09-08 LAB — PROTIME-INR
INR: 1.3 — ABNORMAL HIGH (ref 0.8–1.2)
Prothrombin Time: 15.8 seconds — ABNORMAL HIGH (ref 11.4–15.2)

## 2022-09-08 LAB — BRAIN NATRIURETIC PEPTIDE: B Natriuretic Peptide: >4500 pg/mL — ABNORMAL HIGH (ref 0.0–100.0)

## 2022-09-08 LAB — PROCALCITONIN: Procalcitonin: 1.55 ng/mL

## 2022-09-08 LAB — MAGNESIUM: Magnesium: 2 mg/dL (ref 1.7–2.4)

## 2022-09-08 MED ORDER — ACETAMINOPHEN 325 MG PO TABS: 650.0000 mg | ORAL_TABLET | Freq: Four times a day (QID) | ORAL | Status: DC | PRN

## 2022-09-08 MED ORDER — LEVOFLOXACIN 500 MG PO TABS: 500.0000 mg | ORAL_TABLET | Freq: Every day | ORAL | 0 refills | Status: DC

## 2022-09-08 MED ORDER — INSULIN ASPART 100 UNIT/ML IJ SOLN: 0.0000 [IU] | Freq: Three times a day (TID) | INTRAMUSCULAR | Status: DC

## 2022-09-08 MED ORDER — ONDANSETRON HCL 4 MG/2ML IJ SOLN: 4.0000 mg | Freq: Four times a day (QID) | INTRAMUSCULAR | Status: DC | PRN

## 2022-09-08 MED ORDER — INSULIN GLARGINE-YFGN 100 UNIT/ML ~~LOC~~ SOLN
15.0000 [IU] | Freq: Every day | SUBCUTANEOUS | Status: DC
  Filled 2022-09-08: qty 0.15

## 2022-09-08 MED ORDER — LEVOFLOXACIN IN D5W 500 MG/100ML IV SOLN: 500.0000 mg | INTRAVENOUS | Status: DC

## 2022-09-08 MED ORDER — MELATONIN 3 MG PO TABS: 3.0000 mg | ORAL_TABLET | Freq: Every evening | ORAL | Status: DC | PRN

## 2022-09-08 MED ORDER — ASPIRIN 81 MG PO TBEC: 81.0000 mg | DELAYED_RELEASE_TABLET | Freq: Every day | ORAL | Status: DC

## 2022-09-08 MED ORDER — LEVOFLOXACIN 500 MG PO TABS
500.0000 mg | ORAL_TABLET | Freq: Every day | ORAL | 0 refills | Status: DC
  Filled 2022-09-08: qty 5, 5d supply, fill #0

## 2022-09-08 MED ORDER — ACETAMINOPHEN 650 MG RE SUPP: 650.0000 mg | Freq: Four times a day (QID) | RECTAL | Status: DC | PRN

## 2022-09-08 MED ORDER — SEVELAMER CARBONATE 800 MG PO TABS: 1600.0000 mg | ORAL_TABLET | Freq: Three times a day (TID) | ORAL | Status: DC

## 2022-09-08 NOTE — ED Provider Notes (Signed)
  Provider Note MRN:  [id number]  Arrival date & time: 09/08/22    ED Course and Medical Decision Making  Assumed care from Dr. Gayleen Orem at shift change.  Patient presenting with shortness of breath, hypoxia, received dialysis.  After which still requiring oxygen, oral temperature 100.2, concern for possible pneumonia.  Plan is for admission.  Procedures  Final Clinical Impressions(s) / ED Diagnoses     ICD-10-CM   1. Hypoxia  R09.02     2. Acute pulmonary edema  J81.0       ED Discharge Orders     None         Discharge Instructions      Thank you for coming to [facility] Emergency Department. You were seen for shortness of breath. You received dialysis and your symptoms improved. We have provided shelter resources and social services resources.   Please follow up with your primary care provider within 1 week. Please attend your dialysis as originally scheduled.  Do not hesitate to return to the ED or call 911 if you experience: -Worsening symptoms -Lightheadedness, passing out -Fevers/chills -Anything else that concerns you     Elmer Sow. Pilar Plate, MD [facility] Emergency Medicine [facility] [email or web address]    Sabas Sous, MD 09/08/22 [phone]

## 2022-09-08 NOTE — ED Provider Triage Note (Signed)
Emergency Medicine Provider Triage Evaluation Note  [name] , a 52 y.o. male  was evaluated in triage.  Patient complains of cough and says that he was supposed to be admitted to the hospital for the same thing yesterday but left because he did not want to wait for a bed upstairs anymore.  Says that his symptoms have "gotten worser."  Per chart review patient left AMA at around 8 this morning.  Labs were done today.  Will repeat x-ray but no labs Review of Systems  Positive:  Negative:   Physical Exam  BP (!) 197/110 (BP Location: Left Arm)   Pulse 92   Temp 98.4 F (36.9 C) (Oral)   Resp 18   Ht  (1.702 m)   Wt 73 kg   SpO2 98%   BMI 25.21 kg/m  Gen:   Awake, no distress   Resp:  Normal effort  MSK:   Moves extremities without difficulty  Other:  Normal work of breathing, no tachypnea.  Regular rate and rhythm  Medical Decision Making  Medically screening exam initiated at 8:09 PM.  Appropriate orders placed.  KAHIAU SCHEWE was informed that the remainder of the evaluation will be completed by another provider, this initial triage assessment does not replace that evaluation, and the importance of remaining in the ED until their evaluation is complete.     Saddie Benders, PA-C 09/08/22 2009

## 2022-09-08 NOTE — ED Notes (Signed)
Pt on call light at this time. Pt states that he wants to go home. This RN reminded pt that he is being admitted to the hospital. Pt acknowledges that he is being admitted but insists that he is ready to go home and does not want to stay. Admitting MD informed of pt's wishes.

## 2022-09-08 NOTE — ED Notes (Signed)
Unsuccessful IV attempt by this RN. 

## 2022-09-08 NOTE — ED Triage Notes (Signed)
The pt reports that he    has had a non-productive cough for a long time  he was just here on the 19th

## 2022-09-08 NOTE — ED Notes (Addendum)
This RN had conversation with pt and insured that he wanted to leave AMA. Pt insistent upon leaving the hospital. Pt initially resistant to signing AMA form prior to leaving, but eventually AMA form was signed. Pt very resistant to receiving this RN's help to get him where he wants to go. Pt stated multiple times, "Y'all aren't treating me right." Pt provided with bus pass for transportation and taken to the lobby in a wheelchair and assisted to a phone in the lobby to make phone call. Pt refused vital signs prior to leaving. Pt fully alert and oriented and able to make decisions on his own.

## 2022-09-08 NOTE — H&P (Signed)
History and Physical      [name]:[id number] DOB: [birth year] DOA: 09/06/2022  PCP: [name]  Patient coming from: home   I have personally briefly reviewed patient's old medical records in [facility]  Chief Complaint: Shortness of breath  HPI: [name] is a 52 y.o. male with medical history significant for end-stage renal disease on hemodialysis, type 2 diabetes mellitus, anemia of chronic kidney disease associated baseline hemoglobin 8-12, who is admitted to [facility] on 09/06/2022 with suspected community-acquired pneumonia after presenting from home to [facility] ED complaining of shortness of breath.   The patient reports 3 to 4 days of shortness of breath associated new onset mildly productive cough in the absence of hemoptysis.  He notes associated subjective fever over that timeframe, in the absence of chills, full body rigors, or generalized myalgias.  No associated any orthopnea, PND.  No recent trauma.  Denies any associated chest pain, palpitations, diaphoresis, nausea, vomiting, dizziness, presyncope, or syncope.  Patient confirms that he is homeless.  He has a history of end-stage renal disease on hemodialysis, but is not routine with his pursuit of hemodialysis.  Denies any known baseline supplemental oxygen requirements at baseline.     ED Course:  Vital signs in the ED were notable for the following: Temperature max 100.2; heart rate 80 to 90s; systolic blood pressures in the low 100s to 170s; respiratory rate 14-20, initial oxygen saturation 83% on room air, [place] improving into the range 98 to 100% on 8 L nasal cannula leading up to hemodialysis, following which serial oxygen saturations were noted to be in the range of 90 to 93% on 3 L nasal cannula.  Labs were notable for the following: BMP notable for the following: 35, potassium 3.9, bicarbonate 23, anion gap 14, glucose 188.  Initial lactate 1.1, with repeat value trending up slightly to  1.3.  CBC notable for will with cell count 8600 with 80% neutrophils, hemoglobin 9.56 with normocytic/recurrent properties and relative dimensions prior, did 1 left hip with 3 on 09/05/2022.  INR 1.3.  Blood cultures x 2 were collected prior to initiation of IV antibiotics.  COVID, influenza, RSV PCR been ordered, result currently pending.  EDP discussed patient's case with the on-call nephrologist, Dr. Thedore Mins, Who agreed to consult and help facilitate pursuit of hemodialysis.  Imaging and additional notable ED work-up: Following today's hemodialysis, updated plain film of the chest, performed radiology read, shows bilateral lower lobe airspace opacities concerning for infection, will demonstrating mild vascular congestion in the absence of any evidence of pulmonary edema or any evidence of pleural effusion or pneumothorax.  While in the ED, the following were administered: Levaquin  Subsequently, the patient was admitted for further evaluation and management of suspected community-acquired pneumonia complicated by acute Evoxac respiratory distress.     Review of Systems: As per HPI otherwise 10 point review of systems negative.   Past Medical History:  Diagnosis Date   Anxiety 02/2019   Anxiety 10/2019   Diabetes mellitus type II, uncontrolled 07/17/2006   Elevated alkaline phosphatase level 01/2019   ESSENTIAL HYPERTENSION 07/17/2006   Homelessness 10/13/2021   HYPERLIPIDEMIA 11/24/2008   Insomnia 02/2019   Left bundle branch block 02/22/2019   Lesion of lip 10/2019   Pneumonia 10/22/2011   Suicidal ideations 02/2019   Vitamin D deficiency 01/2019    Past Surgical History:  Procedure Laterality Date   ARM SURGERY     AV FISTULA PLACEMENT Right 02/20/2021  Procedure: RIGHT ARTERIOVENOUS GRAFT CREATION;  Surgeon: Chuck Hint, MD;  Location: [facility] OR;  Service: Vascular;  Laterality: Right;   INSERTION OF DIALYSIS CATHETER Left 02/20/2021   Procedure: INSERTION OF DIALYSIS  CATHETER USING PALINDROME CATHETER;  Surgeon: Chuck Hint, MD;  Location: [facility] OR;  Service: Vascular;  Laterality: Left;   IR AV DIALY SHUNT INTRO NEEDLE/INTRACATH INITIAL W/PTA/IMG RIGHT Right 11/06/2021   IR REMOVAL TUN CV CATH W/O FL  07/27/2021   IR US GUIDE VASC ACCESS RIGHT  11/06/2021   ULTRASOUND GUIDANCE FOR VASCULAR ACCESS  02/20/2021   Procedure: ULTRASOUND GUIDANCE FOR VASCULAR ACCESS;  Surgeon: Chuck Hint, MD;  Location: [facility] OR;  Service: Vascular;;    Social History:  reports that he has never smoked. He has never used smokeless tobacco. He reports that he does not drink alcohol and does not use drugs.   Allergies  Allergen Reactions   Sulfa Antibiotics Rash    Severe rash.    Keflex [Cephalexin] Other (See Comments)    Patient with severe drug reaction including exfoliating skin rash and hypotension after being prescribed both cephalexin and sulfamethoxazole-trimethoprim simultaneously. Favor SMX as much more likely culprit as patient had tolerated other cephalosporins in the past, but cannot say with complete certainty that this was not related to cephalexin.    Other Other (See Comments)    Patient states he is allergic to the TB test - unknown reaction during childhood    Bactrim [Sulfamethoxazole-Trimethoprim] Rash    Family History  Problem Relation Age of Onset   Hypertension Mother 86   Cancer Mother        bladder cancer   Alcohol abuse Father    Hypertension Brother     Family history reviewed and not pertinent    Prior to Admission medications   Medication Sig Start Date End Date Taking? Authorizing Provider  acetaminophen (TYLENOL) 500 MG tablet Take 1 tablet (500 mg total) by mouth every 8 (eight) hours as needed for mild pain. 05/28/22   Leroy Sea, MD  amLODipine (NORVASC) 10 MG tablet Take 1 tablet (10 mg total) by mouth daily. 05/26/22   Leroy Sea, MD  amLODipine (NORVASC) 10 MG tablet Take 1 tablet (10 mg total) by  mouth daily. 06/18/22     amLODipine (NORVASC) 10 MG tablet Take 1 tablet (10 mg total) by mouth daily. 06/18/22     ASPIRIN LOW DOSE 81 MG tablet Take 1 tablet (81 mg total) by mouth daily. 05/28/22   Leroy Sea, MD  blood glucose meter kit and supplies KIT Dispense based on patient and insurance preference. Use up to four times daily as directed. (FOR ICD-9 250.00, 250.01). For QAC - HS accuchecks. 05/25/22   Leroy Sea, MD  blood glucose meter kit and supplies KIT Dispense based on patient and insurance preference. Use up to four times daily as directed. (FOR ICD-9 250.00, 250.01). For QAC - HS accuchecks. 05/28/22   Leroy Sea, MD  calcium acetate (PHOSLO) 667 MG capsule Take 3 capsules (2,001 mg total) by mouth 3 (three) times daily. 05/28/22   Leroy Sea, MD  carvedilol (COREG) 6.25 MG tablet Take 1 tablet (6.25 mg total) by mouth 2 (two) times daily with a meal. 05/28/22   Leroy Sea, MD  Fingerstix Lancets MISC Use as directed up to 4 times daily. 05/28/22   Leroy Sea, MD  glucose blood test strip Use to check fasting blood sugar once  daily. 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 Insulin dependent 11/29/20   Hollice Espy, MD  glucose blood test strip Use as directed up to 4 times daily 05/28/22   Leroy Sea, MD  insulin aspart (NOVOLOG) 100 UNIT/ML FlexPen Before each meal 3 times a day, 140-199 - 2 units, 200-250 - 4 units, 251-299 - 6 units,  300-349 - 8 units,  350 or above 10 units. 05/28/22   Leroy Sea, MD  insulin detemir (LEVEMIR) 100 UNIT/ML injection Inject 0.35 mLs (35 Units total) into the skin daily. max daily dose 100 units per day 06/18/22     Insulin Glargine (BASAGLAR KWIKPEN) 100 UNIT/ML Inject 35 Units into the skin daily. Do not exceed 100 units per day. 06/19/22     insulin glargine (LANTUS SOLOSTAR) 100 UNIT/ML Solostar Pen Inject 35 Units into the skin daily.Not to exceed 100 units per day. 06/19/22     Insulin Pen Needle 32G X 4 MM MISC Use with  insulin pens 05/28/22   Leroy Sea, MD  Insulin Syringe-Needle U-100 25G X 1" 1 ML MISC For 4 times a day insulin SQ, 1 month supply. Diagnosis E11.65 05/28/22   Leroy Sea, MD  isosorbide-hydrALAZINE (BIDIL) 20-37.5 MG tablet Take 1 tablet by mouth 3 (three) times daily. 07/20/22     oxyCODONE-acetaminophen (PERCOCET/ROXICET) 5-325 MG tablet Take 1 tablet by mouth every 12 (twelve) hours as needed for severe pain. 07/04/22   Derwood Kaplan, MD  PROTONIX 40 MG tablet Take 1 tablet (40 mg total) by mouth daily. 05/25/22   Leroy Sea, MD  sevelamer carbonate (RENVELA) 800 MG tablet Take 2 tablets (1,600 mg total) by mouth 3 (three) times daily with meals. May also take 1 tablet (800 mg total) 2 (two) times daily as needed with snacks. 05/28/22   Leroy Sea, MD  sitaGLIPtin (JANUVIA) 25 MG tablet take 1 tablet by mouth daily 02/27/22     sitaGLIPtin (JANUVIA) 25 MG tablet Take 1 tablet (25 mg total) by mouth daily. 06/18/22     sodium bicarbonate 650 MG tablet Take 650 mg by mouth daily. 03/19/22   [provider]  traMADol (ULTRAM) 50 MG tablet Take 1 tablet by mouth twice a day as needed for pain 07/15/22     triamcinolone cream (KENALOG) 0.1 % Apply 1 Application topically 2 (two) times daily. 01/06/22   Ellsworth Lennox, PA-C  gabapentin (NEURONTIN) 300 MG capsule Take 1 capsule (300 mg total) by mouth 3 (three) times daily. Patient not taking: Reported on 07/27/2019 07/29/18 08/30/19  Kallie Locks, FNP  sildenafil (VIAGRA) 25 MG tablet Take 1 tablet (25 mg total) by mouth as needed for erectile dysfunction. for erectile dysfunction. Patient not taking: Reported on 08/11/2020 10/13/19 11/27/20  Kallie Locks, FNP     Objective    Physical Exam: Vitals:   09/07/22 1737 09/07/22 1802 09/07/22 2030 09/07/22 2252  BP: (!) 171/91 (!) 164/94 121/60 107/63  Pulse: 92 87 90 87  Resp: Temp:  98.4 F (36.9 C)  100.2 F (37.9 C)  TempSrc:  Oral  Oral  SpO2: 99%  92%  90%  Weight:      Height:        General: appears to be stated age; alert, oriented; mildly increased work of breathing noted. Skin: warm, dry, no rash Head:  AT/[place] Mouth:  Oral mucosa membranes appear moist, normal dentition Neck: supple; trachea midline Heart:  RRR; did not appreciate any  M/R/G Lungs: CTAB, did not appreciate any wheezes, rales, or rhonchi Abdomen: + BS; soft, ND, NT Vascular: 2+ pedal pulses b/l; 2+ radial pulses b/l Extremities: no peripheral edema, no muscle wasting Neuro: strength and sensation intact in upper and lower extremities b/l    Labs on Admission: I have personally reviewed following labs and imaging studies  CBC: Recent Labs  Lab 09/05/22 2245 09/06/22 0010 09/06/22 0011 09/07/22 0010  WBC 6.0  --   --  8.6  NEUTROABS 3.8  --   --  6.9  HGB 10.3* 12.6* 12.2* 9.5*  HCT 34.0* 37.0* 36.0* 29.5*  MCV 92.1  --   --  86.8  PLT 348  --   --  345   Basic Metabolic Panel: Recent Labs  Lab 09/06/22 0010 09/06/22 0011 09/06/22 0016 09/07/22 0010  NA 138 137 134* 135  K 4.8 4.8 4.7 3.9  CL 104  --  101 98  CO2  --   --  19* 23  GLUCOSE 116*  --  124* 188*  BUN 89*  --  88* 46*  CREATININE 12.00*  --  11.11* 7.30*  CALCIUM  --   --  7.4* 7.9*   GFR: Estimated Creatinine Clearance: 11.4 mL/min (A) (by C-G formula based on SCr of 7.3 mg/dL (H)). Liver Function Tests: Recent Labs  Lab 09/06/22 0016 09/07/22 0010  AST 56* 44*  ALT 47* 42  ALKPHOS 475* 382*  BILITOT 0.7 0.9  PROT 7.6 7.4  ALBUMIN 3.2* 3.1*   No results for input(s): "LIPASE", "AMYLASE" in the last 168 hours. No results for input(s): "AMMONIA" in the last 168 hours. Coagulation Profile: Recent Labs  Lab 09/07/22 0010  INR 1.3*   Cardiac Enzymes: No results for input(s): "CKTOTAL", "CKMB", "CKMBINDEX", "TROPONINI" in the last 168 hours. BNP (last 3 results) No results for input(s): "PROBNP" in the last 8760 hours. HbA1C: No results for input(s): "HGBA1C"  in the last 72 hours. CBG: Recent Labs  Lab 09/07/22 1246  GLUCAP 135*   Lipid Profile: No results for input(s): "CHOL", "HDL", "LDLCALC", "TRIG", "CHOLHDL", "LDLDIRECT" in the last 72 hours. Thyroid Function Tests: No results for input(s): "TSH", "T4TOTAL", "FREET4", "T3FREE", "THYROIDAB" in the last 72 hours. Anemia Panel: No results for input(s): "VITAMINB12", "FOLATE", "FERRITIN", "TIBC", "IRON", "RETICCTPCT" in the last 72 hours. Urine analysis:    Component Value Date/Time   COLORURINE YELLOW 02/19/2021 0045   APPEARANCEUR HAZY (A) 02/19/2021 0045   APPEARANCEUR Clear 05/09/2020 1509   LABSPEC 1.013 02/19/2021 0045   PHURINE 5.0 02/19/2021 0045   GLUCOSEU >=500 (A) 02/19/2021 0045   HGBUR MODERATE (A) 02/19/2021 0045   BILIRUBINUR NEGATIVE 02/19/2021 0045   BILIRUBINUR Negative 05/09/2020 1509   KETONESUR NEGATIVE 02/19/2021 0045   PROTEINUR >=300 (A) 02/19/2021 0045   UROBILINOGEN 0.2 02/04/2020 1612   UROBILINOGEN 0.2 02/16/2014 0045   NITRITE NEGATIVE 02/19/2021 0045   LEUKOCYTESUR NEGATIVE 02/19/2021 0045    Radiological Exams on Admission: DG Chest Port 1 View  Result Date: 09/07/2022 CLINICAL DATA:  Questionable sepsis EXAM: PORTABLE CHEST 1 VIEW COMPARISON:  09/05/2022 FINDINGS: Cardiomegaly with vascular congestion. Bilateral perihilar and lower lobe opacities could reflect edema or pneumonia. No effusions. No acute bony abnormality. IMPRESSION: Cardiomegaly, vascular congestion. Perihilar and lower lobe opacities bilaterally could reflect edema or infection. Electronically Signed   By: Charlett Nose M.D.   On: 09/07/2022 01:12      Assessment/Plan   Principal Problem:   CAP (community acquired pneumonia) Active Problems:  Acute hypoxic respiratory failure   End-stage renal disease on hemodialysis   History of anemia due to chronic kidney disease   Essential hypertension   DM2 (diabetes mellitus, type 2)      #) Community-acquired pneumonia:  Diagnosis on the basis of 3 to 4 days of shortness of breath associate with new onset productive cough, subjective fever, with chest x-ray, obtained after hemodialysis session, demonstrating evidence of airspace opacities concerning for infiltrate consistent with infection.  Of note, temperature max noted to be elevated at 100.2.  However, in the absence of formal objective fever in the absence of leukocytosis, surgical treatment for sepsis at this time.  Appears hemodynamically stable.  Blood cultures x 2 were collected prior to initiation of Levaquin, which is selected in the context of a reported allergy to the cephalosporin class.  No other source of infectious process identified at this time, although COVID, RSV, and influenza PCR results are all pending at this time.  Plan: Follow-up results of blood cultures x 2.  Continue Levaquin.  Add on procalcitonin level, BNP, strep pneumo urine antigen.  Incentive [place].  Flutter valve.  Repeat CBC in the morning.  Follow-up result of COVID, [place], RSV PCR.             #) Acute hypoxic respiratory distress: in the context of acute respiratory symptoms and no known baseline supplemental O2 requirements, presenting oxygen saturation noted to be in the low 80s on room air, now in the low 90s on 3 L nasal cannula following aforementioned interval hemodialysis , thereby meeting criteria for acute hypoxic respiratory distress as opposed to acute hypoxic respiratory failure at this time. Appears to be on basis of community-acquired pneumonia, noting evidence of bibasilar infiltrates on chest x-ray, in the absence of any evidence of pulmonary edema, pleural effusion, or pneumothorax. No known chronic underlying pulmonary conditions.  No clinical or radiographic evidence to suggest acutely decompensated heart failure at this time. Clinically, presentation is less suggestive of acute PE at this time. COVID-19/Influenza PCR are pending.    Plan: further  evaluation/management of presenting suspected community-acquired pneumonia, as above. Monitor continuous pulse ox with prn supplemental O2 to maintain O2 sats greater than or equal to 92%. monitor on telemetry. CMP/CBC in the AM. Check serum Mg and Phos levels. Flutter valve, incentive spirometry.  Add on procalcitonin level, BNP.  As it has been greater than 24 hours since most recent EKG, will pursue updated EKG.  Follow result of COVID, influenza, RSV PCR.                #) ESRD: on HD, although the patient conveys that he has not on a set hemodialysis schedule as he has difficulty in routinely making it to the dialysis center in the context of his homeless status.  As described above, EDP discussed patient's case with on-call nephrology earlier today, Dr. Thedore Mins, Who arranged for hemodialysis session that occurred earlier in the day.  Medications include Renvela.  Postdialysis labs demonstrate no evidence of hyperkalemia or any evidence of anion gap metabolic acidosis.  He does not appear uremic at this time.  Plan: monitor strict I's/O's, daily weights. CMP in the AM. Check mag and phos levels.  Nephrology consulted, as above, with potential for additional routine hemodialysis during this hospitalization depending upon the duration of such.  Continue home Renvela.                  #) Type 2 Diabetes Mellitus: documented history  of such. Home insulin regimen: Lantus 35 units SQ daily in addition to sliding scale NovoLog 3 times daily with meals. Home oral hypoglycemic agents: Januvia. presenting blood sugar: 188. Most recent A1c noted to be 8.2% when checked in January 2024.  in terms of initial dose of basal insulin to be started during this hospitalization, will resume approximately half of outpatient dose in order to reduce risk for ensuing hypoglycemia   Plan: accuchecks QAC and HS with low dose SSI.  Lantus 15 units SQ nightly, as above. hold home oral hypoglycemic  agents during this hospitalization.                  #) Essential Hypertension: documented h/o such, with outpatient antihypertensive regimen including amlodipine, Coreg, hydralazine, isosorbide.  In the setting of his presenting infectious process, and most recent systolic blood pressures noted to be in the low 100s after HD session earlier in the day, will hold home importance of medications for now, closely monitor [place] blood pressure trend to assist with guidance of timing of resumption of outpatient hypertensive medications.  Plan: Close monitoring of subsequent BP via routine VS. hold home and hypertensive medications for now, as above.              #) Anemia of chronic kidney disease: Documented history of such, a/w with baseline hgb range 8-12, with presenting hgb consistent with this range, in the absence of any overt evidence of active bleed.     Plan: Repeat CBC in the morning.        DVT prophylaxis: SCD's   Code Status: Full code Family Communication: none Disposition Plan: Per Rounding Team Consults called: EDP discussed patient's case with the on-call nephrologist, Dr. Thedore Mins, Who will formally consult, as further detailed above.;  Admission status: obs     I SPENT GREATER THAN 75  MINUTES IN CLINICAL CARE TIME/MEDICAL DECISION-MAKING IN COMPLETING THIS ADMISSION.      Chaney Born Elysia Grand DO [facility]  From 7PM - 7AM   09/08/2022, 2:50 AM

## 2022-09-08 NOTE — Progress Notes (Signed)
Pharmacy Antibiotic Note  [name] is a 52 y.o. male admitted on 09/06/2022 with pneumonia.  Pharmacy has been consulted for Levaquin dosing. Patient is ESRD on HD MWF. Potentially missed two sessions. Received HD here on 4/20.  Plan: Levaquin 750 mg IV given x 1 Will start Levaquin 500 mg IV every 48 hours Monitor clinical progress, cultures/sensitivities, renal function, abx plan.  Monitor hemodialysis treatments/schedule/plans    Height:  (170.2 cm) Weight:  (UTA d/t pt being on ED stretcher that won't weight and pt refuses to stand to weigh) IBW/kg (Calculated) : 66.1  Temp (24hrs), Avg:99.2 F (37.3 C), Min:98.4 F (36.9 C), Max:100.2 F (37.9 C)  Recent Labs  Lab 09/05/22 2245 09/06/22 0010 09/06/22 0016 09/07/22 0010 09/07/22 0718  WBC 6.0  --   --  8.6  --   CREATININE  --  12.00* 11.11* 7.30*  --   LATICACIDVEN  --   --   --  1.1 1.3    Estimated Creatinine Clearance: 11.4 mL/min (A) (by C-G formula based on SCr of 7.3 mg/dL (H)).    Allergies  Allergen Reactions   Sulfa Antibiotics Rash    Severe rash.    Keflex [Cephalexin] Other (See Comments)    Patient with severe drug reaction including exfoliating skin rash and hypotension after being prescribed both cephalexin and sulfamethoxazole-trimethoprim simultaneously. Favor SMX as much more likely culprit as patient had tolerated other cephalosporins in the past, but cannot say with complete certainty that this was not related to cephalexin.    Other Other (See Comments)    Patient states he is allergic to the TB test - unknown reaction during childhood    Bactrim [Sulfamethoxazole-Trimethoprim] Rash    Antimicrobials this admission: 4/20 Levaquin >>    Thank you for allowing pharmacy to be a part of this patient's care.   Thank you for allowing [place] to participate in this patients care. Signe Colt, PharmD 09/08/2022 2:23 AM  **Pharmacist phone directory can be found on [email or web address] listed  under [facility]**

## 2022-09-08 NOTE — ED Triage Notes (Signed)
He is dailysis pt that gets his dialysis here  in this hospital

## 2022-09-08 NOTE — Discharge Summary (Signed)
Physician Discharge Summary   Patient: [name] MRN: [id number] DOB: 10/05/1970  Admit date:     09/06/2022  Discharge date: 09/08/22  Discharge Physician: Alba Cory   PCP: [name]   Recommendations at discharge:   Left AMA.  Prescription for Levaquin send to pharmacy   Discharge Diagnoses: Principal Problem:   CAP (community acquired pneumonia) Active Problems:   Acute hypoxic respiratory failure   End-stage renal disease on hemodialysis   History of anemia due to chronic kidney disease   Essential hypertension   DM2 (diabetes mellitus, type 2)  Resolved Problems:   * No resolved hospital problems. *  Hospital Course: 52 year old with past medical history significant for ESRD on hemodialysis, diabetes type 2, anemia of chronic disease, presents complaining of shortness of breath, associated with cough, subjective fever.  He presented hypoxic oxygen saturation 83 on room air initially placed on 8 L of oxygen.  Chest x-ray concerning for infection and vascular congestion. Nephrology was consulted, patient underwent hemodialysis.  He was started on Levaquin to cover for pneumonia. Patient decided to leave AMA.  Risk of leaving AMA was explained by nurse.    Assessment and Plan: PNA; Acute Hypoxic resp failure. He had HD.  Started on Antibiotics.  Left AMA        Consultants: Nephrology  Procedures performed: HD Disposition:  Left AMA Diet recommendation:  Carb modified diet DISCHARGE MEDICATION: Allergies as of 09/08/2022       Reactions   Sulfa Antibiotics Rash   Severe rash.    Keflex [cephalexin] Other (See Comments)   Patient with severe drug reaction including exfoliating skin rash and hypotension after being prescribed both cephalexin and sulfamethoxazole-trimethoprim simultaneously. Favor SMX as much more likely culprit as patient had tolerated other cephalosporins in the past, but cannot say with complete certainty that this was not  related to cephalexin.    Other Other (See Comments)   Patient states he is allergic to the TB test - unknown reaction during childhood    Bactrim [sulfamethoxazole-trimethoprim] Rash        Medication List     STOP taking these medications    blood glucose meter kit and supplies Kit   OneTouch Verio Flex System w/Device Kit   sitaGLIPtin 25 MG tablet Commonly known as: JANUVIA   sodium bicarbonate 650 MG tablet   traMADol 50 MG tablet Commonly known as: ULTRAM   triamcinolone cream 0.1 % Commonly known as: KENALOG       TAKE these medications    Accu-Chek Guide test strip Generic drug: glucose blood Use to check fasting blood sugar once daily. 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 Insulin dependent   OneTouch Verio test strip Generic drug: glucose blood Use as directed up to 4 times daily   Acetaminophen Extra Strength 500 MG Tabs Take 1 tablet (500 mg total) by mouth every 8 (eight) hours as needed for mild pain.   amLODipine 10 MG tablet Commonly known as: NORVASC Take 1 tablet (10 mg total) by mouth daily. What changed: Another medication with the same name was removed. Continue taking this medication, and follow the directions you see here.   Aspirin Low Dose 81 MG tablet Generic drug: aspirin EC Take 1 tablet (81 mg total) by mouth daily.   calcium acetate 667 MG capsule Commonly known as: PHOSLO Take 3 capsules (2,001 mg total) by mouth 3 (three) times daily.   carvedilol 6.25 MG tablet Commonly known as: COREG Take 1 tablet (6.25 mg total)  by mouth 2 (two) times daily with a meal.   insulin detemir 100 UNIT/ML injection Commonly known as: LEVEMIR Inject 0.35 mLs (35 Units total) into the skin daily. max daily dose 100 units per day   Insulin Syringe-Needle U-100 25G X 1" 1 ML Misc For 4 times a day insulin SQ, 1 month supply. Diagnosis E11.65   isosorbide-hydrALAZINE 20-37.5 MG tablet Commonly known as: BIDIL Take 1 tablet by mouth 3 (three) times daily.    Lantus SoloStar 100 UNIT/ML Solostar Pen Generic drug: insulin glargine Inject 35 Units into the skin daily.Not to exceed 100 units per day. What changed: Another medication with the same name was removed. Continue taking this medication, and follow the directions you see here.   levofloxacin 500 MG tablet Commonly known as: Levaquin Take 1 tablet (500 mg total) by mouth daily for 5 days.   NovoLOG FlexPen 100 UNIT/ML FlexPen Generic drug: insulin aspart Before each meal 3 times a day, 140-199 - 2 units, 200-250 - 4 units, 251-299 - 6 units,  300-349 - 8 units,  350 or above 10 units.   OneTouch Delica Plus Lancet33G Misc Use as directed up to 4 times daily.   oxyCODONE-acetaminophen 5-325 MG tablet Commonly known as: PERCOCET/ROXICET Take 1 tablet by mouth every 12 (twelve) hours as needed for severe pain.   Protonix 40 MG tablet Generic drug: pantoprazole Take 1 tablet (40 mg total) by mouth daily.   sevelamer carbonate 800 MG tablet Commonly known as: Renvela Take 2 tablets (1,600 mg total) by mouth 3 (three) times daily with meals. May also take 1 tablet (800 mg total) 2 (two) times daily as needed with snacks.   TechLite Pen Needles 32G X 4 MM Misc Generic drug: Insulin Pen Needle Use with insulin pens        Follow-up Information     [facility]. Schedule an appointment as soon as possible for a visit in 1 week.   Why: For follow-up               Discharge Exam: Filed Weights   09/06/22 2235 09/07/22 0732  Weight: 73 kg 73 kg   Patient didn't wait for my evaluation, he left AMA    The results of significant diagnostics from this hospitalization (including imaging, microbiology, ancillary and laboratory) are listed below for reference.   Imaging Studies: DG Chest Port 1 View  Result Date: 09/07/2022 CLINICAL DATA:  Questionable sepsis EXAM: PORTABLE CHEST 1 VIEW COMPARISON:  09/05/2022 FINDINGS: Cardiomegaly with vascular congestion. Bilateral  perihilar and lower lobe opacities could reflect edema or pneumonia. No effusions. No acute bony abnormality. IMPRESSION: Cardiomegaly, vascular congestion. Perihilar and lower lobe opacities bilaterally could reflect edema or infection. Electronically Signed   By: Charlett Nose M.D.   On: 09/07/2022 01:12   DG Chest 2 View  Result Date: 09/05/2022 CLINICAL DATA:  Shortness of breath. EXAM: CHEST - 2 VIEW COMPARISON:  Multiple prior exams most recently 08/24/2022 FINDINGS: Chronic cardiomegaly. Stable mediastinal contours. Similar vascular congestion. Small left pleural effusion. No acute airspace disease. No pneumothorax. Unchanged distal left clavicle fracture. IMPRESSION: Stable cardiomegaly, vascular congestion, and small left pleural effusion. Electronically Signed   By: Narda Rutherford M.D.   On: 09/05/2022 23:32   DG Chest 2 View  Result Date: 08/24/2022 CLINICAL DATA:  Chest pain and cough. EXAM: CHEST - 2 VIEW COMPARISON:  July 19, 2022 FINDINGS: The cardiac silhouette is markedly enlarged and unchanged in size. Mild, stable perihilar pulmonary vascular  prominence is seen. There is no evidence of focal consolidation, pleural effusion or pneumothorax. A subacute fracture deformity is seen involving the distal left clavicle. IMPRESSION: 1. Stable cardiomegaly with chronic central vascular congestion. 2. Subacute fracture of the distal left clavicle. Electronically Signed   By: Aram Candela M.D.   On: 08/24/2022 21:22    Microbiology: Results for orders placed or performed during the hospital encounter of 09/06/22  Blood Culture (routine x 2)     Status: None (Preliminary result)   Collection Time: 09/07/22 12:00 AM   Specimen: BLOOD LEFT ARM  Result Value Ref Range Status   Specimen Description BLOOD LEFT ARM  Final   Special Requests   Final    BOTTLES DRAWN AEROBIC AND ANAEROBIC Blood Culture adequate volume   Culture   Final    NO GROWTH 1 DAY Performed at [facility],  [address] [place], [place] [address]    Report Status PENDING  Incomplete  Blood Culture (routine x 2)     Status: None (Preliminary result)   Collection Time: 09/07/22 12:17 AM   Specimen: BLOOD LEFT HAND  Result Value Ref Range Status   Specimen Description BLOOD LEFT HAND  Final   Special Requests   Final    BOTTLES DRAWN AEROBIC ONLY Blood Culture adequate volume   Culture   Final    NO GROWTH 1 DAY Performed at [facility], [address] [place], [place] [address]    Report Status PENDING  Incomplete    Labs: CBC: Recent Labs  Lab 09/05/22 2245 09/06/22 0010 09/06/22 0011 09/07/22 0010 09/08/22 0340  WBC 6.0  --   --  8.6 7.4  NEUTROABS 3.8  --   --  6.9 5.9  HGB 10.3* 12.6* 12.2* 9.5* 9.3*  HCT 34.0* 37.0* 36.0* 29.5* 29.3*  MCV 92.1  --   --  86.8 86.9  PLT 348  --   --  345 322   Basic Metabolic Panel: Recent Labs  Lab 09/06/22 0010 09/06/22 0011 09/06/22 0016 09/07/22 0010 09/08/22 0340  NA 138 137 134* 135 132*  K 4.8 4.8 4.7 3.9 3.9  CL 104  --  101 98 95*  CO2  --   --  19* 23 24  GLUCOSE 116*  --  124* 188* 216*  BUN 89*  --  88* 46* 33*  CREATININE 12.00*  --  11.11* 7.30* 5.90*  CALCIUM  --   --  7.4* 7.9* 7.9*  MG  --   --   --   --  2.0  PHOS  --   --   --   --  3.6   Liver Function Tests: Recent Labs  Lab 09/06/22 0016 09/07/22 0010 09/08/22 0340  AST 56* 44* 73*  ALT 47* 42 54*  ALKPHOS 475* 382* 341*  BILITOT 0.7 0.9 0.8  PROT 7.6 7.4 7.0  ALBUMIN 3.2* 3.1* 2.8*   CBG: Recent Labs  Lab 09/07/22 1246  GLUCAP 135*    Discharge time spent: less than 30 minutes.  Signed: Alba Cory, MD [facility] 09/08/2022

## 2022-09-09 ENCOUNTER — Emergency Department (HOSPITAL_COMMUNITY): Payer: Medicaid Other

## 2022-09-09 ENCOUNTER — Other Ambulatory Visit: Payer: Self-pay

## 2022-09-09 ENCOUNTER — Encounter (HOSPITAL_COMMUNITY): Payer: Self-pay

## 2022-09-09 ENCOUNTER — Emergency Department (HOSPITAL_COMMUNITY)
Admission: EM | Admit: 2022-09-09 | Discharge: 2022-09-10 | Disposition: A | Discharge status: Home / Self Care | Payer: Medicaid Other | Source: Home / Self Care | Attending: Emergency Medicine | Admitting: Emergency Medicine

## 2022-09-09 DIAGNOSIS — Z992 Dependence on renal dialysis: Secondary | ICD-10-CM | POA: Insufficient documentation

## 2022-09-09 DIAGNOSIS — R600 Localized edema: Secondary | ICD-10-CM | POA: Insufficient documentation

## 2022-09-09 DIAGNOSIS — E1122 Type 2 diabetes mellitus with diabetic chronic kidney disease: Secondary | ICD-10-CM | POA: Insufficient documentation

## 2022-09-09 DIAGNOSIS — N186 End stage renal disease: Secondary | ICD-10-CM | POA: Insufficient documentation

## 2022-09-09 DIAGNOSIS — Z7982 Long term (current) use of aspirin: Secondary | ICD-10-CM | POA: Insufficient documentation

## 2022-09-09 DIAGNOSIS — Z5900 Homelessness unspecified: Secondary | ICD-10-CM | POA: Insufficient documentation

## 2022-09-09 DIAGNOSIS — R052 Subacute cough: Secondary | ICD-10-CM | POA: Insufficient documentation

## 2022-09-09 DIAGNOSIS — Z794 Long term (current) use of insulin: Secondary | ICD-10-CM | POA: Insufficient documentation

## 2022-09-09 DIAGNOSIS — I120 Hypertensive chronic kidney disease with stage 5 chronic kidney disease or end stage renal disease: Secondary | ICD-10-CM | POA: Insufficient documentation

## 2022-09-09 DIAGNOSIS — Z79899 Other long term (current) drug therapy: Secondary | ICD-10-CM | POA: Insufficient documentation

## 2022-09-09 DIAGNOSIS — R059 Cough, unspecified: Secondary | ICD-10-CM | POA: Diagnosis not present

## 2022-09-09 LAB — BASIC METABOLIC PANEL
Anion gap: 16 — ABNORMAL HIGH (ref 5–15)
BUN: 47 mg/dL — ABNORMAL HIGH (ref 6–20)
CO2: 22 mmol/L (ref 22–32)
Calcium: 8 mg/dL — ABNORMAL LOW (ref 8.9–10.3)
Chloride: 96 mmol/L — ABNORMAL LOW (ref 98–111)
Creatinine, Ser: 7.89 mg/dL — ABNORMAL HIGH (ref 0.61–1.24)
GFR, Estimated: 8 mL/min — ABNORMAL LOW (ref >60)
Glucose, Bld: 179 mg/dL — ABNORMAL HIGH (ref 70–99)
Potassium: 4.2 mmol/L (ref 3.5–5.1)
Sodium: 134 mmol/L — ABNORMAL LOW (ref 135–145)

## 2022-09-09 LAB — CBC
HCT: 33 % — ABNORMAL LOW (ref 39.0–52.0)
Hemoglobin: 10.3 g/dL — ABNORMAL LOW (ref 13.0–17.0)
MCH: 27.4 pg (ref 26.0–34.0)
MCHC: 31.2 g/dL (ref 30.0–36.0)
MCV: 87.8 fL (ref 80.0–100.0)
Platelets: 318 10*3/uL (ref 150–400)
RBC: 3.76 MIL/uL — ABNORMAL LOW (ref 4.22–5.81)
RDW: 17.2 % — ABNORMAL HIGH (ref 11.5–15.5)
WBC: 5.4 10*3/uL (ref 4.0–10.5)
nRBC: 0 % (ref 0.0–0.2)

## 2022-09-09 LAB — CULTURE, BLOOD (ROUTINE X 2): Culture: NO GROWTH

## 2022-09-09 MED ORDER — DOXYCYCLINE HYCLATE 100 MG PO TABS
100.0000 mg | ORAL_TABLET | Freq: Two times a day (BID) | ORAL | 0 refills | Status: DC
  Filled 2022-09-09: qty 14, 7d supply, fill #0

## 2022-09-09 MED ORDER — BENZONATATE 100 MG PO CAPS
100.0000 mg | ORAL_CAPSULE | Freq: Once | ORAL | Status: AC
  Administered 2022-09-09: 100 mg via ORAL
  Filled 2022-09-09: qty 1

## 2022-09-09 MED ORDER — BENZONATATE 100 MG PO CAPS
100.0000 mg | ORAL_CAPSULE | Freq: Three times a day (TID) | ORAL | 0 refills | Status: DC
  Filled 2022-09-09: qty 21, 7d supply, fill #0

## 2022-09-09 MED ORDER — DOXYCYCLINE HYCLATE 100 MG PO TABS
100.0000 mg | ORAL_TABLET | Freq: Once | ORAL | Status: AC
  Administered 2022-09-09: 100 mg via ORAL
  Filled 2022-09-09: qty 1

## 2022-09-09 NOTE — ED Provider Triage Note (Addendum)
Emergency Medicine Provider Triage Evaluation Note  [name] , a 52 y.o. male  was evaluated in triage.  Pt complains of cough.  States has had a nonproductive cough for about a year now and it is getting progressively worse.  Endorses associated shortness of breath has been going on for a year as well but no chest pain.  Patient is homeless.  Denies fever and chills.  Denies smoking.  Thinks he may have "fluid" in his lungs.  History of ESRD and CHF.  Review of Systems  Positive: See above Negative: See above  Physical Exam  BP (!) 208/119 (BP Location: Right Arm)   Pulse (!) 108   Temp 99.1 F (37.3 C)   Resp 17   SpO2 99%  Gen:   Awake, no distress   Resp:  Normal effort  MSK:   Moves extremities without difficulty  Other:    Medical Decision Making  Medically screening exam initiated at 6:58 PM.  Appropriate orders placed.  [name] was informed that the remainder of the evaluation will be completed by another provider, this initial triage assessment does not replace that evaluation, and the importance of remaining in the ED until their evaluation is complete.  Work up started   Gareth Eagle, PA-C 09/09/22 1901    Gareth Eagle, PA-C 09/09/22 1902

## 2022-09-09 NOTE — ED Provider Notes (Signed)
[facility] DEPT [facility] Emergency Department Provider Note MRN:  [id number]  Arrival date & time: 09/09/22     Chief Complaint   Cough   History of Present Illness   [name] is a 52 y.o. year-old male with a history of diabetes, ESRD presenting to the ED with chief complaint of cough.  Continued cough.  Was just in the hospital for pneumonia.  Denies shortness of breath, no chest pain.  Review of Systems  A thorough review of systems was obtained and all systems are negative except as noted in the HPI and PMH.   Patient's Health History    Past Medical History:  Diagnosis Date   Anxiety 02/2019   Anxiety 10/2019   Diabetes mellitus type II, uncontrolled 07/17/2006   Elevated alkaline phosphatase level 01/2019   ESSENTIAL HYPERTENSION 07/17/2006   Homelessness 10/13/2021   HYPERLIPIDEMIA 11/24/2008   Insomnia 02/2019   Left bundle branch block 02/22/2019   Lesion of lip 10/2019   Pneumonia 10/22/2011   Suicidal ideations 02/2019   Vitamin D deficiency 01/2019    Past Surgical History:  Procedure Laterality Date   ARM SURGERY     AV FISTULA PLACEMENT Right 02/20/2021   Procedure: RIGHT ARTERIOVENOUS GRAFT CREATION;  Surgeon: Chuck Hint, MD;  Location: [facility] OR;  Service: Vascular;  Laterality: Right;   INSERTION OF DIALYSIS CATHETER Left 02/20/2021   Procedure: INSERTION OF DIALYSIS CATHETER USING PALINDROME CATHETER;  Surgeon: Chuck Hint, MD;  Location: [name] OR;  Service: Vascular;  Laterality: Left;   IR AV DIALY SHUNT INTRO NEEDLE/INTRACATH INITIAL W/PTA/IMG RIGHT Right 11/06/2021   IR REMOVAL TUN CV CATH W/O FL  07/27/2021   IR US GUIDE VASC ACCESS RIGHT  11/06/2021   ULTRASOUND GUIDANCE FOR VASCULAR ACCESS  02/20/2021   Procedure: ULTRASOUND GUIDANCE FOR VASCULAR ACCESS;  Surgeon: Chuck Hint, MD;  Location: [facility] OR;  Service: Vascular;;    Family History  Problem Relation Age of Onset   Hypertension Mother 20   Cancer Mother         bladder cancer   Alcohol abuse Father    Hypertension Brother     Social History   Socioeconomic History   Marital status: Legally Separated    Spouse name: Not on file   Number of children: Not on file   Years of education: Not on file   Highest education level: Not on file  Occupational History   Occupation: cook    Comment: lost job   Occupation: Company secretary    Comment: Updated June 2013   Occupation: Data processing manager    Comment: Began Jan 2014  Tobacco Use   Smoking status: Never   Smokeless tobacco: Never  Vaping Use   Vaping Use: Never used  Substance and Sexual Activity   Alcohol use: No    Alcohol/week: 0.0 standard drinks of alcohol   Drug use: No   Sexual activity: Yes    Partners: Female  Other Topics Concern   Not on file  Social History Narrative   Lives with his mother and brother.   Wife has had a foot amputation; is a type 1 DM.   Social Determinants of Health   Financial Resource Strain: High Risk (05/08/2022)   Overall Financial Resource Strain (CARDIA)    Difficulty of Paying Living Expenses: Very hard  Food Insecurity: Food Insecurity Present (05/08/2022)   Hunger Vital Sign    Worried About Running Out of Food in the Last Year: Often  true    Ran Out of Food in the Last Year: Often true  Transportation Needs: Unmet Transportation Needs (05/08/2022)   PRAPARE - Administrator, Civil Service (Medical): Yes    Lack of Transportation (Non-Medical): Yes  Physical Activity: Not on file  Stress: Not on file  Social Connections: Not on file  Intimate Partner Violence: Not on file     Physical Exam   Vitals:   09/09/22 0252 09/09/22 0513  BP: (!) 177/101 (!) 161/91  Pulse: 86 90  Resp: 17 17  Temp: 98.8 F (37.1 C) 98.6 F (37 C)  SpO2: (!) 85% 91%    CONSTITUTIONAL: Chronically ill-appearing, NAD NEURO/PSYCH:  Alert and oriented x 3, no focal deficits EYES:  eyes equal and reactive ENT/NECK:  no LAD, no  JVD CARDIO: Regular rate, well-perfused, normal S1 and S2 PULM:  CTAB no wheezing or rhonchi GI/GU:  non-distended, non-tender MSK/SPINE:  No gross deformities, no edema SKIN:  no rash, atraumatic   *Additional and/or pertinent findings included in MDM below  Diagnostic and Interventional Summary    EKG Interpretation  Date/Time:    Ventricular Rate:    PR Interval:    QRS Duration:   QT Interval:    QTC Calculation:   R Axis:     Text Interpretation:         Labs Reviewed - No data to display  DG Chest 2 View  Final Result      Medications - No data to display   Procedures  /  Critical Care Procedures  ED Course and Medical Decision Making  Initial Impression and Ddx Patient sitting comfortably, resting in no acute distress, wakes easily.  Vital signs reassuring.  I took part in caring for him last night, he had continued cough and hypoxia despite dialysis and was admitted for possible pneumonia.  He left AGAINST MEDICAL ADVICE.  Says he was not discharged with any antibiotics.  Past medical/surgical history that increases complexity of ED encounter: ESRD  Interpretation of Diagnostics I personally reviewed the Chest Xray and my interpretation is as follows: Largely stable from prior    Patient Reassessment and Ultimate Disposition/Management     Possible the patient has lingering bacterial pneumonia, will provide course of antibiotics, no emergent process, appropriate for discharge.  Patient management required discussion with the following services or consulting groups:  None  Complexity of Problems Addressed Acute complicated illness or Injury  Additional Data Reviewed and Analyzed Further history obtained from: Recent discharge summary and Prior labs/imaging results  Additional Factors Impacting ED Encounter Risk Prescriptions  Elmer Sow. Pilar Plate, MD [facility] Emergency Medicine [facility] [email or web address]  Final Clinical  Impressions(s) / ED Diagnoses     ICD-10-CM   1. Acute cough  R05.1       ED Discharge Orders          Ordered    benzonatate (TESSALON) 100 MG capsule  Every 8 hours        09/09/22 0605    doxycycline (VIBRAMYCIN) 100 MG capsule  2 times daily        09/09/22 0605             Discharge Instructions Discussed with and Provided to Patient:    Discharge Instructions      You were evaluated in the Emergency Department and after careful evaluation, we did not find any emergent condition requiring admission or further testing in the hospital.  Your exam/testing today  was overall reassuring.  Symptoms may be due to pneumonia.  Take the antibiotics as directed, use the Tessalon as needed for cough.  Please return to the Emergency Department if you experience any worsening of your condition.  Thank you for allowing [place] to be a part of your care.       Sabas Sous, MD 09/09/22 [phone]

## 2022-09-09 NOTE — Discharge Instructions (Signed)
You were evaluated in the Emergency Department and after careful evaluation, we did not find any emergent condition requiring admission or further testing in the hospital.  Your exam/testing today was overall reassuring.  Symptoms may be due to pneumonia.  Take the antibiotics as directed, use the Tessalon as needed for cough.  Please return to the Emergency Department if you experience any worsening of your condition.  Thank you for allowing [place] to be a part of your care.

## 2022-09-09 NOTE — ED Triage Notes (Signed)
Pt here for eval of continued cough. Dialysis pt, last session Saturday. Seen last night/ night before for same, states he has no money to get prescription cough medicine & abx.

## 2022-09-09 NOTE — ED Notes (Signed)
Pt refused covid swab. °

## 2022-09-09 NOTE — ED Provider Notes (Signed)
[facility] AT [facility] Provider Note   CSN: [id number] Arrival date & time: 09/09/22  1839     History {Add pertinent medical, surgical, social history, OB history to HPI:1} Chief Complaint  Patient presents with   Cough    [name] is a 52 y.o. male.  HPI   Patient with medical history including diabetes, end-stage renal disease currently on dialysis Tuesday Thursday Saturday, homeless,Hypertension, presenting with complaints of cough, states has been going on for a while and he would like some help with it.  He states that he has been unable to obtain his medications due to financial issues.  He states that his symptoms have remained unchanged since he was admitted 2 days, he is not having worsening cough fevers chills general body aches.  He states he does have some leg swelling and he was concerned that he might be volume overloaded and may need dialysis.  States that he got his last dialysis on Saturday, he is scheduled to go tomorrow.  I reviewed patient's chart been seen multiple times, was initially seen on 04/20, and received dialysis treatment, on reassessment after dialysis patient was hypoxic and there was concern for possible pneumonia and he was admitted.  Patient unfortunately left  AMA, he was given dose of Levaquin.  Patient was then seen earlier today at 6 AM, for cough, benign physical exam, discharged home on doxycycline Tessalon.  Home Medications Prior to Admission medications   Medication Sig Start Date End Date Taking? Authorizing Provider  acetaminophen (TYLENOL) 500 MG tablet Take 1 tablet (500 mg total) by mouth every 8 (eight) hours as needed for mild pain. 05/28/22   Leroy Sea, MD  amLODipine (NORVASC) 10 MG tablet Take 1 tablet (10 mg total) by mouth daily. 05/26/22   Leroy Sea, MD  ASPIRIN LOW DOSE 81 MG tablet Take 1 tablet (81 mg total) by mouth daily. 05/28/22   Leroy Sea, MD  benzonatate  (TESSALON) 100 MG capsule Take 1 capsule (100 mg total) by mouth every 8 (eight) hours. 09/09/22   Sabas Sous, MD  calcium acetate (PHOSLO) 667 MG capsule Take 3 capsules (2,001 mg total) by mouth 3 (three) times daily. 05/28/22   Leroy Sea, MD  carvedilol (COREG) 6.25 MG tablet Take 1 tablet (6.25 mg total) by mouth 2 (two) times daily with a meal. 05/28/22   Leroy Sea, MD  doxycycline (VIBRA-TABS) 100 MG tablet Take 1 tablet (100 mg total) by mouth 2 (two) times daily for 7 days. 09/09/22 09/16/22  Sabas Sous, MD  Fingerstix Lancets MISC Use as directed up to 4 times daily. 05/28/22   Leroy Sea, MD  glucose blood test strip Use to check fasting blood sugar once daily. 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 Insulin dependent 11/29/20   Hollice Espy, MD  glucose blood test strip Use as directed up to 4 times daily 05/28/22   Leroy Sea, MD  insulin aspart (NOVOLOG) 100 UNIT/ML FlexPen Before each meal 3 times a day, 140-199 - 2 units, 200-250 - 4 units, 251-299 - 6 units,  300-349 - 8 units,  350 or above 10 units. 05/28/22   Leroy Sea, MD  insulin detemir (LEVEMIR) 100 UNIT/ML injection Inject 0.35 mLs (35 Units total) into the skin daily. max daily dose 100 units per day 06/18/22     insulin glargine (LANTUS SOLOSTAR) 100 UNIT/ML Solostar Pen Inject 35 Units into the skin daily.Not to  exceed 100 units per day. 06/19/22     Insulin Pen Needle 32G X 4 MM MISC Use with insulin pens 05/28/22   Leroy Sea, MD  Insulin Syringe-Needle U-100 25G X 1" 1 ML MISC For 4 times a day insulin SQ, 1 month supply. Diagnosis E11.65 05/28/22   Leroy Sea, MD  isosorbide-hydrALAZINE (BIDIL) 20-37.5 MG tablet Take 1 tablet by mouth 3 (three) times daily. 07/20/22     levofloxacin (LEVAQUIN) 500 MG tablet Take 1 tablet (500 mg total) by mouth daily for 5 days. 09/08/22 09/14/22  Regalado, Jon Billings A, MD  oxyCODONE-acetaminophen (PERCOCET/ROXICET) 5-325 MG tablet Take 1 tablet by mouth every 12  (twelve) hours as needed for severe pain. 07/04/22   Derwood Kaplan, MD  PROTONIX 40 MG tablet Take 1 tablet (40 mg total) by mouth daily. 05/25/22   Leroy Sea, MD  sevelamer carbonate (RENVELA) 800 MG tablet Take 2 tablets (1,600 mg total) by mouth 3 (three) times daily with meals. May also take 1 tablet (800 mg total) 2 (two) times daily as needed with snacks. 05/28/22   Leroy Sea, MD  gabapentin (NEURONTIN) 300 MG capsule Take 1 capsule (300 mg total) by mouth 3 (three) times daily. Patient not taking: Reported on 07/27/2019 07/29/18 08/30/19  Kallie Locks, FNP  sildenafil (VIAGRA) 25 MG tablet Take 1 tablet (25 mg total) by mouth as needed for erectile dysfunction. for erectile dysfunction. Patient not taking: Reported on 08/11/2020 10/13/19 11/27/20  Kallie Locks, FNP      Allergies    Sulfa antibiotics, Keflex [cephalexin], Other, and Bactrim [sulfamethoxazole-trimethoprim]    Review of Systems   Review of Systems  Constitutional:  Negative for chills and fever.  Respiratory:  Positive for cough. Negative for shortness of breath.   Cardiovascular:  Negative for chest pain.  Gastrointestinal:  Negative for abdominal pain.  Neurological:  Negative for headaches.    Physical Exam Updated Vital Signs BP (!) 208/119 (BP Location: Right Arm)   Pulse (!) 108   Temp 99.1 F (37.3 C)   Resp 17   SpO2 99%  Physical Exam Vitals and nursing note reviewed.  Constitutional:      General: He is not in acute distress.    Appearance: He is not ill-appearing.  HENT:     Head: Normocephalic and atraumatic.     Nose: No congestion.  Eyes:     Conjunctiva/sclera: Conjunctivae normal.  Cardiovascular:     Rate and Rhythm: Normal rate and regular rhythm.     Pulses: Normal pulses.     Heart sounds: No murmur heard.    No friction rub. No gallop.  Pulmonary:     Effort: No respiratory distress.     Breath sounds: No wheezing, rhonchi or rales.     Comments: Slight rales  the right lower lobe, no rhonchi stridor or wheezing present.  No evidence of respiratory distress nontachypneic nonhypoxic speaking full sentences during my evaluation. Musculoskeletal:     Comments: Patient had noted 1+ nonpitting edema bilaterally  Skin:    General: Skin is warm and dry.  Neurological:     Mental Status: He is alert.  Psychiatric:        Mood and Affect: Mood normal.     ED Results / Procedures / Treatments   Labs (all labs ordered are listed, but only abnormal results are displayed) Labs Reviewed  BASIC METABOLIC PANEL - Abnormal; Notable for the following components:  Result Value   Sodium 134 (*)    Chloride 96 (*)    Glucose, Bld 179 (*)    BUN 47 (*)    Creatinine, Ser 7.89 (*)    Calcium 8.0 (*)    GFR, Estimated 8 (*)    Anion gap 16 (*)    All other components within normal limits  CBC - Abnormal; Notable for the following components:   RBC 3.76 (*)    Hemoglobin 10.3 (*)    HCT 33.0 (*)    RDW 17.2 (*)    All other components within normal limits  SARS CORONAVIRUS 2 BY RT PCR    EKG None  Radiology DG Chest 2 View  Result Date: 09/09/2022 CLINICAL DATA:  Cough EXAM: CHEST - 2 VIEW COMPARISON:  Chest x-ray dated September 08, 2022; chest x-ray dated June 09, 2022; chest CT dated May 24, 2022 FINDINGS: Unchanged cardiomegaly. Unchanged right middle lobe consolidation. No new lung opacity. Redemonstrated fracture of the distal left clavicle. No evidence of pleural effusion or pneumothorax. IMPRESSION: 1. No evidence of acute airspace opacity. 2. Right middle lobe consolidation, similar when compared with multiple prior exams. Given chronicity, consider CT for better evaluation. 3. Redemonstrated fracture of the distal left clavicle. 4. Unchanged cardiomegaly. Electronically Signed   By: Allegra Lai M.D.   On: 09/09/2022 20:06   DG Chest 2 View  Result Date: 09/08/2022 CLINICAL DATA:  Cough, dyspnea EXAM: CHEST - 2 VIEW COMPARISON:   09/07/2022 FINDINGS: Stable pulmonary insufflation. No pneumothorax or pleural effusion. Stable asymmetric right basilar consolidation. Stable moderate cardiomegaly. Unchanged central pulmonary vascular congestion. No acute bone abnormality. IMPRESSION: 1. Stable pulmonary insufflation. 2. Stable asymmetric right basilar consolidation, asymmetric edema or infection. 3. Stable moderate cardiomegaly with central pulmonary vascular congestion. Electronically Signed   By: Helyn Numbers M.D.   On: 09/08/2022 20:34    Procedures Procedures  {Document cardiac monitor, telemetry assessment procedure when appropriate:1}  Medications Ordered in ED Medications - No data to display  ED Course/ Medical Decision Making/ A&P   {   Click here for ABCD2, HEART and other calculatorsREFRESH Note before signing :1}                          Medical Decision Making Risk Prescription drug management.   This patient presents to the ED for concern of cough, this involves an extensive number of treatment options, and is a complaint that carries with it a high risk of complications and morbidity.  The differential diagnosis includes pneumonia, CHF, emergent hemodialysis    Additional history obtained:  Additional history obtained from N/A External records from outside source obtained and reviewed including recent hospitalization   Co morbidities that complicate the patient evaluation  End-stage renal disease  Social Determinants of Health:  Homelessness    Lab Tests:  I Ordered, and personally interpreted labs.  The pertinent results include: CBC shows normocytic anemia hemoglobin 10.3, BMP reveals sodium 134, chloride 96, glucose 179, BUN 47, creatinine 7, calcium 8, GFR 8, anion gap 16   Imaging Studies ordered:  I ordered imaging studies including chest x-ray I independently visualized and interpreted imaging which showed negative acute significant findings I agree with the radiologist  interpretation   Cardiac Monitoring:  The patient was maintained on a cardiac monitor.  I personally viewed and interpreted the cardiac monitored which showed an underlying rhythm of: N/A   Medicines ordered and prescription drug management:  I ordered medication including doxycycline, Tessalon I have reviewed the patients home medicines and have made adjustments as needed  Critical Interventions:  N/A   Reevaluation:  Presents with cough, triage obtain basic lab workup and imaging which I personally reviewed, appear to be unremarkable, patient had benign physical exam, will provide him with doxycycline, Tessalon, discharged home.   Consultations Obtained:  N/A    Test Considered:  N/A    Rule out Suspicion for pneumonia is low at this time, nontoxic-appearing, vital signs reassuring, no evidence of pneumonia seen on chest x-ray.  I doubt CHF exacerbation requiring hospitalization as he has no new oxygen requirements, no noted pleural effusion seen on chest x-ray.  I doubt patient needs emergent dialysis as there is no electrolyte derailment, no new oxygen requirements.  Doubt DKA presentation is atypical, no decrease in CO2, anion gap is mildly elevated I suspect this is more secondary due to history of renal disease will likely clear after dialysis session tomorrow.  Dispostion and problem list  After consideration of the diagnostic results and the patients response to treatment, I feel that the patent would benefit from discharge.  Cough-suspect this is viral in nature but cannot exclude the possibly of early pneumonia, will provide with a dose of antibiotics here while he is in the ED, and send ambulatory referral to social work. Dialysis-patient will need to go to his dialysis treatment tomorrow, will encourage him to do so.      {Document critical care time when appropriate:1} {Document review of labs and clinical decision tools ie heart score, Chads2Vasc2  etc:1}  {Document your independent review of radiology images, and any outside records:1} {Document your discussion with family members, caretakers, and with consultants:1} {Document social determinants of health affecting pt's care:1} {Document your decision making why or why not admission, treatments were needed:1} Final Clinical Impression(s) / ED Diagnoses Final diagnoses:  None    Rx / DC Orders ED Discharge Orders     None

## 2022-09-10 ENCOUNTER — Inpatient Hospital Stay (HOSPITAL_COMMUNITY)
Admission: EM | Admit: 2022-09-10 | Discharge: 2022-09-15 | DRG: 314 | Disposition: A | Discharge status: Home / Self Care | Payer: Medicaid Other | Attending: Family Medicine | Admitting: Family Medicine

## 2022-09-10 DIAGNOSIS — D631 Anemia in chronic kidney disease: Secondary | ICD-10-CM | POA: Diagnosis present

## 2022-09-10 DIAGNOSIS — E785 Hyperlipidemia, unspecified: Secondary | ICD-10-CM | POA: Diagnosis present

## 2022-09-10 DIAGNOSIS — I10 Essential (primary) hypertension: Secondary | ICD-10-CM | POA: Diagnosis not present

## 2022-09-10 DIAGNOSIS — Z5900 Homelessness unspecified: Secondary | ICD-10-CM

## 2022-09-10 DIAGNOSIS — Z79899 Other long term (current) drug therapy: Secondary | ICD-10-CM

## 2022-09-10 DIAGNOSIS — Z992 Dependence on renal dialysis: Secondary | ICD-10-CM

## 2022-09-10 DIAGNOSIS — Z8052 Family history of malignant neoplasm of bladder: Secondary | ICD-10-CM

## 2022-09-10 DIAGNOSIS — Z888 Allergy status to other drugs, medicaments and biological substances status: Secondary | ICD-10-CM

## 2022-09-10 DIAGNOSIS — E1159 Type 2 diabetes mellitus with other circulatory complications: Secondary | ICD-10-CM | POA: Diagnosis present

## 2022-09-10 DIAGNOSIS — Z5941 Food insecurity: Secondary | ICD-10-CM

## 2022-09-10 DIAGNOSIS — N2581 Secondary hyperparathyroidism of renal origin: Secondary | ICD-10-CM | POA: Diagnosis present

## 2022-09-10 DIAGNOSIS — T82590A Other mechanical complication of surgically created arteriovenous fistula, initial encounter: Principal | ICD-10-CM

## 2022-09-10 DIAGNOSIS — I447 Left bundle-branch block, unspecified: Secondary | ICD-10-CM | POA: Diagnosis present

## 2022-09-10 DIAGNOSIS — Z881 Allergy status to other antibiotic agents status: Secondary | ICD-10-CM

## 2022-09-10 DIAGNOSIS — Z538 Procedure and treatment not carried out for other reasons: Secondary | ICD-10-CM | POA: Diagnosis present

## 2022-09-10 DIAGNOSIS — Z7982 Long term (current) use of aspirin: Secondary | ICD-10-CM

## 2022-09-10 DIAGNOSIS — N186 End stage renal disease: Secondary | ICD-10-CM

## 2022-09-10 DIAGNOSIS — Z5986 Financial insecurity: Secondary | ICD-10-CM

## 2022-09-10 DIAGNOSIS — F411 Generalized anxiety disorder: Secondary | ICD-10-CM | POA: Diagnosis present

## 2022-09-10 DIAGNOSIS — Z91199 Patient's noncompliance with other medical treatment and regimen due to unspecified reason: Secondary | ICD-10-CM

## 2022-09-10 DIAGNOSIS — E871 Hypo-osmolality and hyponatremia: Secondary | ICD-10-CM | POA: Diagnosis present

## 2022-09-10 DIAGNOSIS — I152 Hypertension secondary to endocrine disorders: Secondary | ICD-10-CM | POA: Diagnosis present

## 2022-09-10 DIAGNOSIS — F419 Anxiety disorder, unspecified: Secondary | ICD-10-CM | POA: Diagnosis present

## 2022-09-10 DIAGNOSIS — Z882 Allergy status to sulfonamides status: Secondary | ICD-10-CM

## 2022-09-10 DIAGNOSIS — Z794 Long term (current) use of insulin: Secondary | ICD-10-CM

## 2022-09-10 DIAGNOSIS — E11649 Type 2 diabetes mellitus with hypoglycemia without coma: Secondary | ICD-10-CM | POA: Diagnosis present

## 2022-09-10 DIAGNOSIS — E8770 Fluid overload, unspecified: Secondary | ICD-10-CM | POA: Diagnosis present

## 2022-09-10 DIAGNOSIS — Z811 Family history of alcohol abuse and dependence: Secondary | ICD-10-CM

## 2022-09-10 DIAGNOSIS — J9691 Respiratory failure, unspecified with hypoxia: Secondary | ICD-10-CM | POA: Diagnosis not present

## 2022-09-10 DIAGNOSIS — E119 Type 2 diabetes mellitus without complications: Secondary | ICD-10-CM

## 2022-09-10 DIAGNOSIS — I120 Hypertensive chronic kidney disease with stage 5 chronic kidney disease or end stage renal disease: Secondary | ICD-10-CM | POA: Diagnosis present

## 2022-09-10 DIAGNOSIS — T82898A Other specified complication of vascular prosthetic devices, implants and grafts, initial encounter: Secondary | ICD-10-CM | POA: Diagnosis present

## 2022-09-10 DIAGNOSIS — Z5982 Transportation insecurity: Secondary | ICD-10-CM

## 2022-09-10 DIAGNOSIS — Z8249 Family history of ischemic heart disease and other diseases of the circulatory system: Secondary | ICD-10-CM

## 2022-09-10 DIAGNOSIS — E1122 Type 2 diabetes mellitus with diabetic chronic kidney disease: Secondary | ICD-10-CM | POA: Diagnosis present

## 2022-09-10 DIAGNOSIS — Y832 Surgical operation with anastomosis, bypass or graft as the cause of abnormal reaction of the patient, or of later complication, without mention of misadventure at the time of the procedure: Secondary | ICD-10-CM | POA: Diagnosis present

## 2022-09-10 DIAGNOSIS — M898X9 Other specified disorders of bone, unspecified site: Secondary | ICD-10-CM | POA: Diagnosis present

## 2022-09-10 DIAGNOSIS — T82868A Thrombosis of vascular prosthetic devices, implants and grafts, initial encounter: Principal | ICD-10-CM

## 2022-09-10 LAB — HEPATITIS B SURFACE ANTIBODY, QUANTITATIVE: Hep B S AB Quant (Post): <3.5 m[IU]/mL — ABNORMAL LOW (ref >9.9)

## 2022-09-10 NOTE — ED Notes (Signed)
Cleaned right knee wound using wound cleanser. Dressed with non-adherent dressing, rolled gauze, and ace wrap.

## 2022-09-10 NOTE — Discharge Instructions (Signed)
I recommend picking up the prescriptions that were prescribed to you.  May take over-the-counter pain medications like Tylenol for fever and pain control, nasal decongestions like Flonase and Zyrtec, Mucinex for cough.   Follow-up PCP for further evaluation.  Please go to your dialysis treatment tomorrow   Come back to the emergency department if you develop chest pain, shortness of breath, severe abdominal pain, uncontrolled nausea, vomiting, diarrhea.

## 2022-09-11 ENCOUNTER — Inpatient Hospital Stay (HOSPITAL_COMMUNITY): Payer: Medicaid Other

## 2022-09-11 ENCOUNTER — Other Ambulatory Visit: Payer: Self-pay

## 2022-09-11 ENCOUNTER — Encounter (HOSPITAL_COMMUNITY): Payer: Self-pay | Admitting: Emergency Medicine

## 2022-09-11 ENCOUNTER — Emergency Department (HOSPITAL_COMMUNITY): Payer: Medicaid Other

## 2022-09-11 DIAGNOSIS — T82868A Thrombosis of vascular prosthetic devices, implants and grafts, initial encounter: Secondary | ICD-10-CM | POA: Diagnosis not present

## 2022-09-11 DIAGNOSIS — N186 End stage renal disease: Secondary | ICD-10-CM | POA: Diagnosis not present

## 2022-09-11 DIAGNOSIS — Z888 Allergy status to other drugs, medicaments and biological substances status: Secondary | ICD-10-CM | POA: Diagnosis not present

## 2022-09-11 DIAGNOSIS — Z811 Family history of alcohol abuse and dependence: Secondary | ICD-10-CM | POA: Diagnosis not present

## 2022-09-11 DIAGNOSIS — N2581 Secondary hyperparathyroidism of renal origin: Secondary | ICD-10-CM | POA: Diagnosis present

## 2022-09-11 DIAGNOSIS — Z8249 Family history of ischemic heart disease and other diseases of the circulatory system: Secondary | ICD-10-CM | POA: Diagnosis not present

## 2022-09-11 DIAGNOSIS — Z5900 Homelessness unspecified: Secondary | ICD-10-CM | POA: Diagnosis not present

## 2022-09-11 DIAGNOSIS — Z992 Dependence on renal dialysis: Secondary | ICD-10-CM

## 2022-09-11 DIAGNOSIS — F419 Anxiety disorder, unspecified: Secondary | ICD-10-CM | POA: Diagnosis present

## 2022-09-11 DIAGNOSIS — Z91199 Patient's noncompliance with other medical treatment and regimen due to unspecified reason: Secondary | ICD-10-CM | POA: Diagnosis not present

## 2022-09-11 DIAGNOSIS — R0602 Shortness of breath: Secondary | ICD-10-CM | POA: Diagnosis not present

## 2022-09-11 DIAGNOSIS — Z79899 Other long term (current) drug therapy: Secondary | ICD-10-CM | POA: Diagnosis not present

## 2022-09-11 DIAGNOSIS — D631 Anemia in chronic kidney disease: Secondary | ICD-10-CM | POA: Diagnosis present

## 2022-09-11 DIAGNOSIS — E871 Hypo-osmolality and hyponatremia: Secondary | ICD-10-CM | POA: Diagnosis present

## 2022-09-11 DIAGNOSIS — Z881 Allergy status to other antibiotic agents status: Secondary | ICD-10-CM | POA: Diagnosis not present

## 2022-09-11 DIAGNOSIS — E11649 Type 2 diabetes mellitus with hypoglycemia without coma: Secondary | ICD-10-CM | POA: Diagnosis present

## 2022-09-11 DIAGNOSIS — I120 Hypertensive chronic kidney disease with stage 5 chronic kidney disease or end stage renal disease: Secondary | ICD-10-CM | POA: Diagnosis not present

## 2022-09-11 DIAGNOSIS — Z794 Long term (current) use of insulin: Secondary | ICD-10-CM

## 2022-09-11 DIAGNOSIS — J9691 Respiratory failure, unspecified with hypoxia: Secondary | ICD-10-CM | POA: Diagnosis not present

## 2022-09-11 DIAGNOSIS — Y832 Surgical operation with anastomosis, bypass or graft as the cause of abnormal reaction of the patient, or of later complication, without mention of misadventure at the time of the procedure: Secondary | ICD-10-CM | POA: Diagnosis present

## 2022-09-11 DIAGNOSIS — E785 Hyperlipidemia, unspecified: Secondary | ICD-10-CM | POA: Diagnosis present

## 2022-09-11 DIAGNOSIS — I10 Essential (primary) hypertension: Secondary | ICD-10-CM

## 2022-09-11 DIAGNOSIS — Z882 Allergy status to sulfonamides status: Secondary | ICD-10-CM | POA: Diagnosis not present

## 2022-09-11 DIAGNOSIS — Z8052 Family history of malignant neoplasm of bladder: Secondary | ICD-10-CM | POA: Diagnosis not present

## 2022-09-11 DIAGNOSIS — E8779 Other fluid overload: Secondary | ICD-10-CM | POA: Diagnosis not present

## 2022-09-11 DIAGNOSIS — T82898A Other specified complication of vascular prosthetic devices, implants and grafts, initial encounter: Secondary | ICD-10-CM | POA: Diagnosis present

## 2022-09-11 DIAGNOSIS — E119 Type 2 diabetes mellitus without complications: Secondary | ICD-10-CM

## 2022-09-11 DIAGNOSIS — Z7982 Long term (current) use of aspirin: Secondary | ICD-10-CM | POA: Diagnosis not present

## 2022-09-11 DIAGNOSIS — E1122 Type 2 diabetes mellitus with diabetic chronic kidney disease: Secondary | ICD-10-CM | POA: Diagnosis present

## 2022-09-11 HISTORY — PX: IR US GUIDE VASC ACCESS RIGHT: IMG2390

## 2022-09-11 HISTORY — PX: IR THROMBECTOMY AV FISTULA W/THROMBOLYSIS INC/SHUNT/IMG RIGHT: IMG6118

## 2022-09-11 LAB — BASIC METABOLIC PANEL
Anion gap: 14 (ref 5–15)
BUN: 61 mg/dL — ABNORMAL HIGH (ref 6–20)
CO2: 22 mmol/L (ref 22–32)
Calcium: 7.3 mg/dL — ABNORMAL LOW (ref 8.9–10.3)
Chloride: 95 mmol/L — ABNORMAL LOW (ref 98–111)
Creatinine, Ser: 9.43 mg/dL — ABNORMAL HIGH (ref 0.61–1.24)
GFR, Estimated: 6 mL/min — ABNORMAL LOW (ref >60)
Glucose, Bld: 129 mg/dL — ABNORMAL HIGH (ref 70–99)
Potassium: 4.2 mmol/L (ref 3.5–5.1)
Sodium: 131 mmol/L — ABNORMAL LOW (ref 135–145)

## 2022-09-11 LAB — I-STAT CHEM 8, ED
BUN: 60 mg/dL — ABNORMAL HIGH (ref 6–20)
Calcium, Ion: 0.75 mmol/L — CL (ref 1.15–1.40)
Chloride: 100 mmol/L (ref 98–111)
Creatinine, Ser: 11.4 mg/dL — ABNORMAL HIGH (ref 0.61–1.24)
Glucose, Bld: 127 mg/dL — ABNORMAL HIGH (ref 70–99)
HCT: 33 % — ABNORMAL LOW (ref 39.0–52.0)
Hemoglobin: 11.2 g/dL — ABNORMAL LOW (ref 13.0–17.0)
Potassium: 4.3 mmol/L (ref 3.5–5.1)
Sodium: 129 mmol/L — ABNORMAL LOW (ref 135–145)
TCO2: 22 mmol/L (ref 22–32)

## 2022-09-11 LAB — CBC WITH DIFFERENTIAL/PLATELET
Abs Immature Granulocytes: 0.03 10*3/uL (ref 0.00–0.07)
Basophils Absolute: 0 10*3/uL (ref 0.0–0.1)
Basophils Relative: 1 %
Eosinophils Absolute: 0 10*3/uL (ref 0.0–0.5)
Eosinophils Relative: 0 %
HCT: 30.2 % — ABNORMAL LOW (ref 39.0–52.0)
Hemoglobin: 9.5 g/dL — ABNORMAL LOW (ref 13.0–17.0)
Immature Granulocytes: 1 %
Lymphocytes Relative: 19 %
Lymphs Abs: 1.1 10*3/uL (ref 0.7–4.0)
MCH: 27.7 pg (ref 26.0–34.0)
MCHC: 31.5 g/dL (ref 30.0–36.0)
MCV: 88 fL (ref 80.0–100.0)
Monocytes Absolute: 0.8 10*3/uL (ref 0.1–1.0)
Monocytes Relative: 14 %
Neutro Abs: 4 10*3/uL (ref 1.7–7.7)
Neutrophils Relative %: 65 %
Platelets: 303 10*3/uL (ref 150–400)
RBC: 3.43 MIL/uL — ABNORMAL LOW (ref 4.22–5.81)
RDW: 16.9 % — ABNORMAL HIGH (ref 11.5–15.5)
WBC: 6 10*3/uL (ref 4.0–10.5)
nRBC: 0 % (ref 0.0–0.2)

## 2022-09-11 LAB — MAGNESIUM: Magnesium: 2 mg/dL (ref 1.7–2.4)

## 2022-09-11 LAB — GLUCOSE, CAPILLARY
Glucose-Capillary: 83 mg/dL (ref 70–99)
Glucose-Capillary: 88 mg/dL (ref 70–99)
Glucose-Capillary: 96 mg/dL (ref 70–99)
Glucose-Capillary: 84 mg/dL (ref 70–99)

## 2022-09-11 LAB — CULTURE, BLOOD (ROUTINE X 2)

## 2022-09-11 LAB — CBG MONITORING, ED
Glucose-Capillary: 150 mg/dL — ABNORMAL HIGH (ref 70–99)
Glucose-Capillary: 37 mg/dL — CL (ref 70–99)

## 2022-09-11 MED ORDER — SEVELAMER CARBONATE 800 MG PO TABS
800.0000 mg | ORAL_TABLET | Freq: Three times a day (TID) | ORAL | Status: DC
  Administered 2022-09-12 – 2022-09-14 (×5): 800 mg via ORAL
  Filled 2022-09-11 (×7): qty 1

## 2022-09-11 MED ORDER — SENNOSIDES-DOCUSATE SODIUM 8.6-50 MG PO TABS: 1.0000 | ORAL_TABLET | Freq: Every evening | ORAL | Status: DC | PRN

## 2022-09-11 MED ORDER — AMLODIPINE BESYLATE 10 MG PO TABS
10.0000 mg | ORAL_TABLET | Freq: Every day | ORAL | Status: DC
  Administered 2022-09-11 – 2022-09-14 (×3): 10 mg via ORAL
  Filled 2022-09-11 (×4): qty 1

## 2022-09-11 MED ORDER — ASPIRIN 81 MG PO TBEC
81.0000 mg | DELAYED_RELEASE_TABLET | Freq: Every day | ORAL | Status: DC
  Administered 2022-09-11 – 2022-09-14 (×3): 81 mg via ORAL
  Filled 2022-09-11 (×4): qty 1

## 2022-09-11 MED ORDER — ACETAMINOPHEN 650 MG RE SUPP: 650.0000 mg | Freq: Four times a day (QID) | RECTAL | Status: DC | PRN

## 2022-09-11 MED ORDER — ISOSORB DINITRATE-HYDRALAZINE 20-37.5 MG PO TABS
1.0000 | ORAL_TABLET | Freq: Three times a day (TID) | ORAL | Status: DC
  Administered 2022-09-11 – 2022-09-14 (×5): 1 via ORAL
  Filled 2022-09-11 (×9): qty 1

## 2022-09-11 MED ORDER — MIDAZOLAM HCL 2 MG/2ML IJ SOLN
INTRAMUSCULAR | Status: AC | PRN
  Administered 2022-09-11: 1 mg via INTRAVENOUS

## 2022-09-11 MED ORDER — CARVEDILOL 6.25 MG PO TABS
6.2500 mg | ORAL_TABLET | Freq: Two times a day (BID) | ORAL | Status: DC
  Administered 2022-09-11 – 2022-09-14 (×4): 6.25 mg via ORAL
  Filled 2022-09-11 (×8): qty 1

## 2022-09-11 MED ORDER — FENTANYL CITRATE (PF) 100 MCG/2ML IJ SOLN
INTRAMUSCULAR | Status: AC
  Filled 2022-09-11: qty 2

## 2022-09-11 MED ORDER — PANTOPRAZOLE SODIUM 40 MG PO TBEC
40.0000 mg | DELAYED_RELEASE_TABLET | Freq: Every day | ORAL | Status: DC
  Administered 2022-09-11 – 2022-09-14 (×3): 40 mg via ORAL
  Filled 2022-09-11 (×4): qty 1

## 2022-09-11 MED ORDER — HEPARIN SODIUM (PORCINE) 1000 UNIT/ML IJ SOLN
INTRAMUSCULAR | Status: AC
  Filled 2022-09-11: qty 10

## 2022-09-11 MED ORDER — ALTEPLASE 2 MG IJ SOLR
INTRAMUSCULAR | Status: AC
  Filled 2022-09-11: qty 4

## 2022-09-11 MED ORDER — ACETAMINOPHEN 325 MG PO TABS: 650.0000 mg | ORAL_TABLET | Freq: Four times a day (QID) | ORAL | Status: DC | PRN

## 2022-09-11 MED ORDER — CALCIUM ACETATE (PHOS BINDER) 667 MG PO CAPS
2001.0000 mg | ORAL_CAPSULE | Freq: Three times a day (TID) | ORAL | Status: DC
  Administered 2022-09-12 – 2022-09-14 (×4): 2001 mg via ORAL
  Filled 2022-09-11 (×7): qty 3

## 2022-09-11 MED ORDER — GUAIFENESIN 100 MG/5ML PO LIQD
5.0000 mL | ORAL | Status: DC | PRN
  Administered 2022-09-11 – 2022-09-13 (×4): 5 mL via ORAL
  Filled 2022-09-11 (×3): qty 15
  Filled 2022-09-11: qty 5
  Filled 2022-09-11: qty 15

## 2022-09-11 MED ORDER — INSULIN ASPART 100 UNIT/ML IJ SOLN
0.0000 [IU] | INTRAMUSCULAR | Status: DC
  Administered 2022-09-11: 2 [IU] via SUBCUTANEOUS
  Administered 2022-09-12: 3 [IU] via SUBCUTANEOUS
  Administered 2022-09-12: 2 [IU] via SUBCUTANEOUS
  Administered 2022-09-13: 3 [IU] via SUBCUTANEOUS
  Administered 2022-09-14 (×2): 2 [IU] via SUBCUTANEOUS

## 2022-09-11 MED ORDER — NALOXONE HCL 0.4 MG/ML IJ SOLN
INTRAMUSCULAR | Status: AC
  Filled 2022-09-11: qty 1

## 2022-09-11 MED ORDER — FENTANYL CITRATE (PF) 100 MCG/2ML IJ SOLN
INTRAMUSCULAR | Status: AC | PRN
  Administered 2022-09-11: 50 ug via INTRAVENOUS

## 2022-09-11 MED ORDER — HEPARIN SODIUM (PORCINE) 5000 UNIT/ML IJ SOLN
5000.0000 [IU] | Freq: Three times a day (TID) | INTRAMUSCULAR | Status: DC
  Administered 2022-09-14: 5000 [IU] via SUBCUTANEOUS
  Filled 2022-09-11 (×4): qty 1

## 2022-09-11 MED ORDER — INSULIN GLARGINE-YFGN 100 UNIT/ML ~~LOC~~ SOLN
15.0000 [IU] | Freq: Every day | SUBCUTANEOUS | Status: DC
  Administered 2022-09-12: 15 [IU] via SUBCUTANEOUS
  Filled 2022-09-11 (×4): qty 0.15

## 2022-09-11 MED ORDER — IOHEXOL 300 MG/ML  SOLN: 100.0000 mL | Freq: Once | INTRAMUSCULAR | Status: DC | PRN

## 2022-09-11 MED ORDER — INSULIN GLARGINE-YFGN 100 UNIT/ML ~~LOC~~ SOLN
35.0000 [IU] | Freq: Every day | SUBCUTANEOUS | Status: DC
  Filled 2022-09-11: qty 0.35

## 2022-09-11 MED ORDER — CALCIUM GLUCONATE-NACL 2-0.675 GM/100ML-% IV SOLN
2.0000 g | Freq: Once | INTRAVENOUS | Status: AC
  Administered 2022-09-11: 2000 mg via INTRAVENOUS
  Filled 2022-09-11: qty 100

## 2022-09-11 MED ORDER — NALOXONE HCL 0.4 MG/ML IJ SOLN
INTRAMUSCULAR | Status: AC | PRN
  Administered 2022-09-11: .4 mg via INTRAVENOUS

## 2022-09-11 MED ORDER — LIDOCAINE HCL 1 % IJ SOLN
INTRAMUSCULAR | Status: AC
  Filled 2022-09-11: qty 20

## 2022-09-11 MED ORDER — MIDAZOLAM HCL 2 MG/2ML IJ SOLN
INTRAMUSCULAR | Status: AC
  Filled 2022-09-11: qty 2

## 2022-09-11 NOTE — ED Notes (Signed)
Offered pt cough medication he requested and pt refused

## 2022-09-11 NOTE — Sedation Documentation (Signed)
Rapid response entered room for assistance.  Patient being bagged on high flow o2 with o2 sats improved and noted to be at 98% at this time. Patient remains semi-responsive at this time.

## 2022-09-11 NOTE — Progress Notes (Signed)
PROGRESS NOTE    [name]  RUE:[id number] DOB: [birth year] DOA: 09/10/2022 PCP: [name]   Brief Narrative:  This is a 52 year old male with past medical history of ESRD, HTN, HLD, homelessness, and anxiety, presented with AV fistula malfunction.  He was at hemodialysis and was unable to be dialyzed secondary to his fistula malfunction.  They have been contacting nephrology who recommended admission.    Assessment & Plan:   Principal Problem:   Hemodialysis AV fistula thrombosis, initial encounter Active Problems:   End-stage renal disease on hemodialysis   Homelessness   Essential hypertension   DM2 (diabetes mellitus, type 2)   Hypocalcemia   Anxiety disorder, unspecified  AV fistula likely thrombosis/malfunction/ESRD on HD: IR and nephrology on board.  Patient is scheduled to have tunneled catheter today followed by dialysis.  Type 2 diabetes mellitus: Patient was hypoglycemic this morning.  He is refusing all care.  I reduced his glargine from 35 units to 15 units.  He is on SSI.  Essential hypertension: Blood pressure slightly elevated, home medications including amlodipine, Imdur, Coreg have been resumed but patient has been refusing all medications.  DVT prophylaxis: heparin injection 5,000 Units Start: 09/11/22 0600   Code Status: Full Code  Family Communication:  None present at bedside.    Status is: Observation The patient will require care spanning > 2 midnights and should be moved to inpatient because: Still waiting for dialysis catheter and then hemodialysis afterward.   Estimated body mass index is 25.21 kg/m as calculated from the following:   Height as of this encounter:  (1.702 m).   Weight as of this encounter: 73 kg.    Nutritional Assessment: Body mass index is 25.21 kg/m.Marland Kitchen Seen by dietician.  I agree with the assessment and plan as outlined below: Nutrition Status:        . Skin Assessment: I have examined the patient's skin  and I agree with the wound assessment as performed by the wound care RN as outlined below:    Consultants:  IR and nephrology  Procedures:  None  Antimicrobials:  Anti-infectives (From admission, onward)    None         Subjective: Patient seen and examined.  Patient is known to me from previous visits.  He tends to not talk or if he would ever talk after asking the question 3-4 times, he would never open his eyes.  Appears comfortable and wants to sleep, as usual.  Objective: Vitals:   09/10/22 2351 09/11/22 0010  BP: (!) 159/100   Pulse: 85   Resp: 18   Temp: 99 F (37.2 C)   SpO2: 92%   Weight:  73 kg  Height:   (1.702 m)   No intake or output data in the 24 hours ending 09/11/22 1325 Filed Weights   09/11/22 0010  Weight: 73 kg    Examination:  General exam: Appears calm and comfortable  Respiratory system: Clear to auscultation. Respiratory effort normal. Cardiovascular system: S1 & S2 heard, RRR. No JVD, murmurs, rubs, gallops or clicks. No pedal edema. Gastrointestinal system: Abdomen is nondistended, soft and nontender. No organomegaly or masses felt. Normal bowel sounds heard.  Data Reviewed: I have personally reviewed following labs and imaging studies  CBC: Recent Labs  Lab 09/05/22 2245 09/06/22 0010 09/07/22 0010 09/08/22 0340 09/09/22 1910 09/11/22 0048 09/11/22 0101  WBC 6.0  --  8.6 7.4 5.4 6.0  --   NEUTROABS 3.8  --  6.9 5.9  --  4.0  --   HGB 10.3*   < > 9.5* 9.3* 10.3* 9.5* 11.2*  HCT 34.0*   < > 29.5* 29.3* 33.0* 30.2* 33.0*  MCV 92.1  --  86.8 86.9 87.8 88.0  --   PLT 348  --  345 322 318 303  --    < > = values in this interval not displayed.   Basic Metabolic Panel: Recent Labs  Lab 09/06/22 0016 09/07/22 0010 09/08/22 0340 09/09/22 1910 09/11/22 0048 09/11/22 0101  NA 134* 135 132* 134* 131* 129*  K 4.7 3.9 3.9 4.2 4.2 4.3  CL 101 98 95* 96* 95* 100  CO2 19* --   GLUCOSE 124* 188* 216* 179* 129*  127*  BUN 88* 46* 33* 47* 61* 60*  CREATININE 11.11* 7.30* 5.90* 7.89* 9.43* 11.40*  CALCIUM 7.4* 7.9* 7.9* 8.0* 7.3*  --   MG  --   --  2.0  --  2.0  --   PHOS  --   --  3.6  --   --   --    GFR: Estimated Creatinine Clearance: 7.3 mL/min (A) (by C-G formula based on SCr of 11.4 mg/dL (H)). Liver Function Tests: Recent Labs  Lab 09/06/22 0016 09/07/22 0010 09/08/22 0340  AST 56* 44* 73*  ALT 47* 42 54*  ALKPHOS 475* 382* 341*  BILITOT 0.7 0.9 0.8  PROT 7.6 7.4 7.0  ALBUMIN 3.2* 3.1* 2.8*   No results for input(s): "LIPASE", "AMYLASE" in the last 168 hours. No results for input(s): "AMMONIA" in the last 168 hours. Coagulation Profile: Recent Labs  Lab 09/07/22 0010 09/08/22 0340  INR 1.3* 1.3*   Cardiac Enzymes: No results for input(s): "CKTOTAL", "CKMB", "CKMBINDEX", "TROPONINI" in the last 168 hours. BNP (last 3 results) No results for input(s): "PROBNP" in the last 8760 hours. HbA1C: No results for input(s): "HGBA1C" in the last 72 hours. CBG: Recent Labs  Lab 09/07/22 1246 09/11/22 0335 09/11/22 0608 09/11/22 0919  GLUCAP 135* 150* 37* 83   Lipid Profile: No results for input(s): "CHOL", "HDL", "LDLCALC", "TRIG", "CHOLHDL", "LDLDIRECT" in the last 72 hours. Thyroid Function Tests: No results for input(s): "TSH", "T4TOTAL", "FREET4", "T3FREE", "THYROIDAB" in the last 72 hours. Anemia Panel: No results for input(s): "VITAMINB12", "FOLATE", "FERRITIN", "TIBC", "IRON", "RETICCTPCT" in the last 72 hours. Sepsis Labs: Recent Labs  Lab 09/07/22 0010 09/07/22 0718 09/08/22 0340  PROCALCITON  --   --  1.55  LATICACIDVEN 1.1 1.3  --     Recent Results (from the past 240 hour(s))  Blood Culture (routine x 2)     Status: None (Preliminary result)   Collection Time: 09/07/22 12:00 AM   Specimen: BLOOD LEFT ARM  Result Value Ref Range Status   Specimen Description BLOOD LEFT ARM  Final   Special Requests   Final    BOTTLES DRAWN AEROBIC AND ANAEROBIC Blood  Culture adequate volume   Culture   Final    NO GROWTH 4 DAYS Performed at [facility], [address] [place], [place] [address]    Report Status PENDING  Incomplete  Blood Culture (routine x 2)     Status: None (Preliminary result)   Collection Time: 09/07/22 12:17 AM   Specimen: BLOOD LEFT HAND  Result Value Ref Range Status   Specimen Description BLOOD LEFT HAND  Final   Special Requests   Final    BOTTLES DRAWN AEROBIC ONLY Blood Culture adequate volume   Culture  Final    NO GROWTH 4 DAYS Performed at [facility], [address], [place], [place] [address]    Report Status PENDING  Incomplete     Radiology Studies: DG Chest Portable 1 View  Result Date: 09/11/2022 CLINICAL DATA:  Shortness of breath. EXAM: PORTABLE CHEST 1 VIEW COMPARISON:  September 09, 2022 FINDINGS: The cardiac silhouette is markedly enlarged and unchanged in size. Mildly increased perihilar interstitial lung markings are seen. This is increased in severity when compared to the prior study. The area of right middle lobe consolidation seen on the prior study is no longer visualized. There is no evidence of a pleural effusion or pneumothorax. The visualized skeletal structures are unremarkable. IMPRESSION: Stable cardiomegaly with findings consistent with mild interstitial edema. Electronically Signed   By: Aram Candela M.D.   On: 09/11/2022 01:05   DG Chest 2 View  Result Date: 09/09/2022 CLINICAL DATA:  Cough EXAM: CHEST - 2 VIEW COMPARISON:  Chest x-ray dated September 08, 2022; chest x-ray dated June 09, 2022; chest CT dated May 24, 2022 FINDINGS: Unchanged cardiomegaly. Unchanged right middle lobe consolidation. No new lung opacity. Redemonstrated fracture of the distal left clavicle. No evidence of pleural effusion or pneumothorax. IMPRESSION: 1. No evidence of acute airspace opacity. 2. Right middle lobe consolidation, similar when compared with multiple prior exams. Given chronicity,  consider CT for better evaluation. 3. Redemonstrated fracture of the distal left clavicle. 4. Unchanged cardiomegaly. Electronically Signed   By: Allegra Lai M.D.   On: 09/09/2022 20:06    Scheduled Meds:  amLODipine  10 mg Oral Daily   aspirin EC  81 mg Oral Daily   calcium acetate  2,001 mg Oral TID with meals   carvedilol  6.25 mg Oral BID WC   heparin  5,000 Units Subcutaneous Q8H   insulin aspart  0-15 Units Subcutaneous Q4H   [START ON 09/12/2022] insulin glargine-yfgn  15 Units Subcutaneous Daily   isosorbide-hydrALAZINE  1 tablet Oral TID   pantoprazole  40 mg Oral Daily   sevelamer carbonate  800 mg Oral TID WC   Continuous Infusions:   LOS: 0 days   Hughie Closs, MD [facility]  09/11/2022, 1:25 PM   *Please note that this is a verbal dictation therefore any spelling or grammatical errors are due to the "[employer]agon [employer] One" system interpretation.  Please page via Amion and do not message via secure chat for urgent patient care matters. Secure chat can be used for non urgent patient care matters.  How to contact the [facility] Attending or Consulting provider 7A - 7P or covering provider during after hours 7P -7A, for this patient?  Check the care team in [facility] and look for a) attending/consulting [facility] provider listed and b) the [facility] team listed. Page or secure chat 7A-7P. Log into [email or web address] and use [place]'s universal password to access. If you do not have the password, please contact the hospital operator. Locate the [facility] provider you are looking for under [facility] and page to a number that you can be directly reached. If you still have difficulty reaching the provider, please page the [facility] (Director on Call) for the Hospitalists listed on amion for assistance.

## 2022-09-11 NOTE — Consult Note (Signed)
Chief Complaint: Patient was seen in consultation today for malfunctioning right arm AV fistula  Referring Physician(s): Sabra Heck, MD  Supervising Physician: Gilmer Mor  Patient Status: [facility] - In-pt  History of Present Illness: [name] is a 52 y.o. male with PMH significant for anxiety, type 2 diabetes mellitus, hypertension, homelessness, and left bundle branch block being seen today in relation to malfunctioning RUE AV fistula. Patient normally receives hemodialysis 3x weekly on Tuesday, Thursday, Saturday. Patient reports that he was unable to have dialysis yesterday due to his fistula not working. Patient presented to [facility] ED where he was seen by Nephrology team. Nephrology recommended evaluation by IR for image-guided fistulagram with possible intervention with possible tunneled dialysis catheter placement.  Past Medical History:  Diagnosis Date   Anxiety 02/2019   Anxiety 10/2019   Diabetes mellitus type II, uncontrolled 07/17/2006   Elevated alkaline phosphatase level 01/2019   ESSENTIAL HYPERTENSION 07/17/2006   Homelessness 10/13/2021   HYPERLIPIDEMIA 11/24/2008   Insomnia 02/2019   Left bundle branch block 02/22/2019   Lesion of lip 10/2019   Pneumonia 10/22/2011   Suicidal ideations 02/2019   Vitamin D deficiency 01/2019    Past Surgical History:  Procedure Laterality Date   ARM SURGERY     AV FISTULA PLACEMENT Right 02/20/2021   Procedure: RIGHT ARTERIOVENOUS GRAFT CREATION;  Surgeon: Chuck Hint, MD;  Location: [facility] OR;  Service: Vascular;  Laterality: Right;   INSERTION OF DIALYSIS CATHETER Left 02/20/2021   Procedure: INSERTION OF DIALYSIS CATHETER USING PALINDROME CATHETER;  Surgeon: Chuck Hint, MD;  Location: [facility] OR;  Service: Vascular;  Laterality: Left;   IR AV DIALY SHUNT INTRO NEEDLE/INTRACATH INITIAL W/PTA/IMG RIGHT Right 11/06/2021   IR REMOVAL TUN CV CATH W/O FL  07/27/2021   IR US GUIDE VASC ACCESS RIGHT  11/06/2021    ULTRASOUND GUIDANCE FOR VASCULAR ACCESS  02/20/2021   Procedure: ULTRASOUND GUIDANCE FOR VASCULAR ACCESS;  Surgeon: Chuck Hint, MD;  Location: [facility] OR;  Service: Vascular;;    Allergies: Sulfa antibiotics, Keflex [cephalexin], Other, and Bactrim [sulfamethoxazole-trimethoprim]  Medications: Prior to Admission medications   Medication Sig Start Date End Date Taking? Authorizing Provider  acetaminophen (TYLENOL) 500 MG tablet Take 1 tablet (500 mg total) by mouth every 8 (eight) hours as needed for mild pain. Patient not taking: Reported on 09/11/2022 05/28/22   Leroy Sea, MD  amLODipine (NORVASC) 10 MG tablet Take 1 tablet (10 mg total) by mouth daily. Patient not taking: Reported on 09/11/2022 05/26/22   Leroy Sea, MD  ASPIRIN LOW DOSE 81 MG tablet Take 1 tablet (81 mg total) by mouth daily. Patient not taking: Reported on 09/11/2022 05/28/22   Leroy Sea, MD  benzonatate (TESSALON) 100 MG capsule Take 1 capsule (100 mg total) by mouth every 8 (eight) hours. Patient not taking: Reported on 09/11/2022 09/09/22   Sabas Sous, MD  calcium acetate (PHOSLO) 667 MG capsule Take 3 capsules (2,001 mg total) by mouth 3 (three) times daily. Patient not taking: Reported on 09/11/2022 05/28/22   Leroy Sea, MD  carvedilol (COREG) 6.25 MG tablet Take 1 tablet (6.25 mg total) by mouth 2 (two) times daily with a meal. Patient not taking: Reported on 09/11/2022 05/28/22   Leroy Sea, MD  doxycycline (VIBRA-TABS) 100 MG tablet Take 1 tablet (100 mg total) by mouth 2 (two) times daily for 7 days. Patient not taking: Reported on 09/11/2022 09/09/22 09/16/22  Sabas Sous, MD  Fingerstix Lancets MISC Use as directed up to 4 times daily. Patient not taking: Reported on 09/11/2022 05/28/22   Leroy Sea, MD  glucose blood test strip Use to check fasting blood sugar once daily. 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 Patient not taking: Reported on 09/11/2022 05/28/22   Leroy Sea, MD  insulin detemir (LEVEMIR) 100 UNIT/ML injection Inject 0.35 mLs (35 Units total) into the skin daily. max daily dose 100 units per day Patient not taking: Reported on 09/11/2022 06/18/22     insulin glargine (LANTUS SOLOSTAR) 100 UNIT/ML Solostar Pen Inject 35 Units into the skin daily.Not to exceed 100 units per day. Patient not taking: Reported on 09/11/2022 06/19/22     Insulin Pen Needle 32G X 4 MM MISC Use with insulin pens Patient not taking: Reported on 09/11/2022 05/28/22   Leroy Sea, MD  Insulin Syringe-Needle U-100 25G X 1" 1 ML MISC For 4 times a day insulin SQ, 1 month supply. Diagnosis E11.65 Patient not taking: Reported on 09/11/2022 05/28/22   Leroy Sea, MD  isosorbide-hydrALAZINE (BIDIL) 20-37.5 MG tablet Take 1 tablet by mouth 3 (three) times daily. Patient not taking: Reported on 09/11/2022 07/20/22     levofloxacin (LEVAQUIN) 500 MG tablet Take 1 tablet (500 mg total) by mouth daily for 5 days. Patient not taking: Reported on 09/11/2022 09/08/22 09/14/22  Regalado, Jon Billings A, MD  oxyCODONE-acetaminophen (PERCOCET/ROXICET) 5-325 MG tablet Take 1 tablet by mouth every 12 (twelve) hours as needed for severe pain. Patient not taking: Reported on 09/11/2022 07/04/22   Derwood Kaplan, MD  PROTONIX 40 MG tablet Take 1 tablet (40 mg total) by mouth daily. Patient not taking: Reported on 09/11/2022 05/25/22   Leroy Sea, MD  sevelamer carbonate (RENVELA) 800 MG tablet Take 2 tablets (1,600 mg total) by mouth 3 (three) times daily with meals. May also take 1 tablet (800 mg total) 2 (two)  times daily as needed with snacks. Patient not taking: Reported on 09/11/2022 05/28/22   Leroy Sea, MD  gabapentin (NEURONTIN) 300 MG capsule Take 1 capsule (300 mg total) by mouth 3 (three) times daily. Patient not taking: Reported on 07/27/2019 07/29/18 08/30/19  Kallie Locks, FNP  sildenafil (VIAGRA) 25 MG tablet Take 1 tablet (25 mg total) by mouth as needed for erectile dysfunction. for erectile dysfunction. Patient not taking: Reported on 08/11/2020 10/13/19 11/27/20  Kallie Locks, FNP     Family History  Problem Relation Age of Onset   Hypertension Mother 45   Cancer Mother        bladder cancer   Alcohol abuse Father    Hypertension Brother     Social History   Socioeconomic History   Marital status: Legally Separated    Spouse name: Not on file   Number of children: Not on file   Years of education: Not on file   Highest education level: Not on file  Occupational History   Occupation: cook    Comment: lost job   Occupation: Company secretary  Comment: Updated June 2013   Occupation: Data processing manager    Comment: Began Jan 2014  Tobacco Use   Smoking status: Never   Smokeless tobacco: Never  Vaping Use   Vaping Use: Never used  Substance and Sexual Activity   Alcohol use: No    Alcohol/week: 0.0 standard drinks of alcohol   Drug use: No   Sexual activity: Yes    Partners: Female  Other Topics Concern   Not on file  Social History Narrative   Lives with his mother and brother.   Wife has had a foot amputation; is a type 1 DM.   Social Determinants of Health   Financial Resource Strain: High Risk (05/08/2022)   Overall Financial Resource Strain (CARDIA)    Difficulty of Paying Living Expenses: Very hard  Food Insecurity: Food Insecurity Present (05/08/2022)   Hunger Vital Sign    Worried About Running Out of Food in the Last Year: Often true    Ran Out of Food in the Last Year: Often true  Transportation Needs: Unmet Transportation Needs  (05/08/2022)   PRAPARE - Administrator, Civil Service (Medical): Yes    Lack of Transportation (Non-Medical): Yes  Physical Activity: Not on file  Stress: Not on file  Social Connections: Not on file    Code Status: Full Code  Review of Systems: A 12 point ROS discussed and pertinent positives are indicated in the HPI above.  All other systems are negative.  Review of Systems  Reason unable to perform ROS: Patient refused to answer ROS questions.    Vital Signs: BP (!) 159/100 (BP Location: Left Arm)   Pulse 85   Temp 99 F (37.2 C)   Resp 18   Ht  (1.702 m)   Wt 160 lb 15 oz (73 kg)   SpO2 92%   BMI 25.21 kg/m     Physical Exam Vitals reviewed.  HENT:     Mouth/Throat:     Mouth: Mucous membranes are moist.  Cardiovascular:     Rate and Rhythm: Normal rate and regular rhythm.     Pulses: Normal pulses.     Heart sounds: Normal heart sounds.  Pulmonary:     Effort: Pulmonary effort is normal.     Breath sounds: Rales present.     Comments: Rales bilaterally in lower lung fields Abdominal:     General: There is distension.     Palpations: Abdomen is soft.  Musculoskeletal:     Right lower leg: Edema present.     Left lower leg: Edema present.     Comments: RUE AV fistula with no audible bruits. Has palpable pulse without thrill. 1+ pitting edema bilaterally  Skin:    General: Skin is warm and dry.  Neurological:     Mental Status: He is alert and oriented to person, place, and time.     Comments: Patient initially did not want to answer questions regarding self, DOB, location, or time, but eventually correctly answered all four when explained that procedure could not occur without ensuring he was oriented  Psychiatric:        Mood and Affect: Affect is angry.        Behavior: Behavior is uncooperative.        Thought Content: Thought content normal.        Judgment: Judgment normal.     Imaging: DG Chest Portable 1 View  Result Date:  09/11/2022 CLINICAL DATA:  Shortness of breath. EXAM:  PORTABLE CHEST 1 VIEW COMPARISON:  September 09, 2022 FINDINGS: The cardiac silhouette is markedly enlarged and unchanged in size. Mildly increased perihilar interstitial lung markings are seen. This is increased in severity when compared to the prior study. The area of right middle lobe consolidation seen on the prior study is no longer visualized. There is no evidence of a pleural effusion or pneumothorax. The visualized skeletal structures are unremarkable. IMPRESSION: Stable cardiomegaly with findings consistent with mild interstitial edema. Electronically Signed   By: Aram Candela M.D.   On: 09/11/2022 01:05   DG Chest 2 View  Result Date: 09/09/2022 CLINICAL DATA:  Cough EXAM: CHEST - 2 VIEW COMPARISON:  Chest x-ray dated September 08, 2022; chest x-ray dated June 09, 2022; chest CT dated May 24, 2022 FINDINGS: Unchanged cardiomegaly. Unchanged right middle lobe consolidation. No new lung opacity. Redemonstrated fracture of the distal left clavicle. No evidence of pleural effusion or pneumothorax. IMPRESSION: 1. No evidence of acute airspace opacity. 2. Right middle lobe consolidation, similar when compared with multiple prior exams. Given chronicity, consider CT for better evaluation. 3. Redemonstrated fracture of the distal left clavicle. 4. Unchanged cardiomegaly. Electronically Signed   By: Allegra Lai M.D.   On: 09/09/2022 20:06   DG Chest 2 View  Result Date: 09/08/2022 CLINICAL DATA:  Cough, dyspnea EXAM: CHEST - 2 VIEW COMPARISON:  09/07/2022 FINDINGS: Stable pulmonary insufflation. No pneumothorax or pleural effusion. Stable asymmetric right basilar consolidation. Stable moderate cardiomegaly. Unchanged central pulmonary vascular congestion. No acute bone abnormality. IMPRESSION: 1. Stable pulmonary insufflation. 2. Stable asymmetric right basilar consolidation, asymmetric edema or infection. 3. Stable moderate cardiomegaly with  central pulmonary vascular congestion. Electronically Signed   By: Helyn Numbers M.D.   On: 09/08/2022 20:34   DG Chest Port 1 View  Result Date: 09/07/2022 CLINICAL DATA:  Questionable sepsis EXAM: PORTABLE CHEST 1 VIEW COMPARISON:  09/05/2022 FINDINGS: Cardiomegaly with vascular congestion. Bilateral perihilar and lower lobe opacities could reflect edema or pneumonia. No effusions. No acute bony abnormality. IMPRESSION: Cardiomegaly, vascular congestion. Perihilar and lower lobe opacities bilaterally could reflect edema or infection. Electronically Signed   By: Charlett Nose M.D.   On: 09/07/2022 01:12   DG Chest 2 View  Result Date: 09/05/2022 CLINICAL DATA:  Shortness of breath. EXAM: CHEST - 2 VIEW COMPARISON:  Multiple prior exams most recently 08/24/2022 FINDINGS: Chronic cardiomegaly. Stable mediastinal contours. Similar vascular congestion. Small left pleural effusion. No acute airspace disease. No pneumothorax. Unchanged distal left clavicle fracture. IMPRESSION: Stable cardiomegaly, vascular congestion, and small left pleural effusion. Electronically Signed   By: Narda Rutherford M.D.   On: 09/05/2022 23:32   DG Chest 2 View  Result Date: 08/24/2022 CLINICAL DATA:  Chest pain and cough. EXAM: CHEST - 2 VIEW COMPARISON:  July 19, 2022 FINDINGS: The cardiac silhouette is markedly enlarged and unchanged in size. Mild, stable perihilar pulmonary vascular prominence is seen. There is no evidence of focal consolidation, pleural effusion or pneumothorax. A subacute fracture deformity is seen involving the distal left clavicle. IMPRESSION: 1. Stable cardiomegaly with chronic central vascular congestion. 2. Subacute fracture of the distal left clavicle. Electronically Signed   By: Aram Candela M.D.   On: 08/24/2022 21:22    Labs:  CBC: Recent Labs    09/07/22 0010 09/08/22 0340 09/09/22 1910 09/11/22 0048 09/11/22 0101  WBC 8.6 7.4 5.4 6.0  --   HGB 9.5* 9.3* 10.3* 9.5* 11.2*  HCT  29.5* 29.3* 33.0* 30.2* 33.0*  PLT 345 322  318 303  --     COAGS: Recent Labs    09/07/22 0010 09/08/22 0340  INR 1.3* 1.3*  APTT 32  --     BMP: Recent Labs    09/07/22 0010 09/08/22 0340 09/09/22 1910 09/11/22 0048 09/11/22 0101  NA 135 132* 134* 131* 129*  K 3.9 3.9 4.2 4.2 4.3  CL 98 95* 96* 95* 100  CO2 23 24 22 22   --   GLUCOSE 188* 216* 179* 129* 127*  BUN 46* 33* 47* 61* 60*  CALCIUM 7.9* 7.9* 8.0* 7.3*  --   CREATININE 7.30* 5.90* 7.89* 9.43* 11.40*  GFRNONAA 8* 11* 8* 6*  --     LIVER FUNCTION TESTS: Recent Labs    08/24/22 2151 09/06/22 0016 09/07/22 0010 09/08/22 0340  BILITOT 0.9 0.7 0.9 0.8  AST 32 56* 44* 73*  ALT 27 47* 42 54*  ALKPHOS 328* 475* 382* 341*  PROT 7.5 7.6 7.4 7.0  ALBUMIN 3.4* 3.2* 3.1* 2.8*    TUMOR MARKERS: No results for input(s): "AFPTM", "CEA", "CA199", "CHROMGRNA" in the last 8760 hours.  Assessment and Plan:  [name] is a 52 yo male being seen today for malfunctioning AV fistula. Nephrology has requested image-guided fistulagram with possible intervention. If this is unsuccessful, Nephrology is requesting tunneled dialysis catheter placement. Patient is agreeable to procedure, but is not willing to answer questions regarding his symptoms. Case has been reviewed and approved by Dr Loreta Ave, and is tentatively scheduled to proceed on 4/24. Patient's last oral intake was candy at ~7AM per the patient. Importance of remaining NPO was discussed with the patient and he agreed to comply.   Risks and benefits discussed with the patient including, but not limited to bleeding, infection, vascular injury, pulmonary embolism, need for tunneled HD catheter placement or even death.  All of the patient's questions were answered, patient is agreeable to proceed. Consent signed and in chart.   Thank you for this interesting consult.  I greatly enjoyed meeting [name] and look forward to participating in their care.  A copy of  this report was sent to the requesting provider on this date.  Electronically Signed: Kennieth Francois, PA-C 09/11/2022, 11:09 AM   I spent a total of 40 Minutes  in face to face in clinical consultation, greater than 50% of which was counseling/coordinating care for malfunctioning right arm AV fistula.

## 2022-09-11 NOTE — Progress Notes (Signed)
Unable to do admission as pt refused to talk and also refused skin check.

## 2022-09-11 NOTE — ED Notes (Signed)
Night nurse is reporting patient is refusing all medications and recent finger sticks. I just tried to recheck CBG patient decline. Patient is also refusing vital signs. MD made. Will continue to monitor

## 2022-09-11 NOTE — Inpatient Diabetes Management (Signed)
Inpatient Diabetes Program Recommendations  AACE/ADA: New Consensus Statement on Inpatient Glycemic Control (2015)  Target Ranges:  Prepandial:   less than 140 mg/dL      Peak postprandial:   less than 180 mg/dL (1-2 hours)      Critically ill patients:  140 - 180 mg/dL   Lab Results  Component Value Date   GLUCAP 83 09/11/2022   HGBA1C 7.7 (H) 09/08/2022    Review of Glycemic Control  Latest Reference Range & Units 09/11/22 03:35 09/11/22 06:08 09/11/22 09:19  Glucose-Capillary 70 - 99 mg/dL 409 (H) 37 (LL) 83   Diabetes history: DM 2 Outpatient Diabetes medications:  Lantus 35 units Daily, Novolog 2-10 units tid Current orders for Inpatient glycemic control:  Novolog 0-15 units Q4 hours Semglee 15 units Daily  A1c 7.7% on 4/21  Inpatient Diabetes Program Recommendations:    Note renal function elevated with ESRD pt with hypoglycemia of 37 after current dose of Novolog Correction scale   -  Reduce Novolog Correction to "very sensitive" 0-6 units Q4 -  Hold Semglee until glucose trends consistently are ablve 180 mg/dl.  Thanks,  Christena Deem RN, MSN, BC-ADM Inpatient Diabetes Coordinator Team Pager [phone] (8a-5p)

## 2022-09-11 NOTE — ED Notes (Signed)
Pt advised he is not to eat or drink per MD orders. Pt becomes angry and eats 2 bags of skittles candy

## 2022-09-11 NOTE — Significant Event (Signed)
Rapid Response Event Note   Reason for Call :  Code Blue activation  Initial Focused Assessment:  Staff supporting patient's breathing with O2 via ambubag. Per staff he had become somnolent and apneic after receiving  versed and 50 mcg fentanyl.  Staff describes that he had a blue swollen tongue at the time. Staff gave patient 0.4 Narcan and he started to wake up.  BP 197/120  HR 89  RR 14 O2 sat 100%  He had some bloody sputum after nasal trumpet placed, which resolved after a few minutes.   Breathing easily, VSS  Transported to 5M13 via bed RN at bedside to receive patient Per staff his interactions are very similar to prior to procedure.   BP 160/100  HR 74  RR 13 O2 sat 92% on RA Temp 98.1 CBG 109   Interventions:    Plan of Care:     Event Summary:   MD Notified:  Dr Ardelle Anton at bedside, Dr Jacqulyn Bath notified by Dr Ardelle Anton Call Time: 1649 Arrival Time: 1652 End Time: 1730  Marcellina Millin, RN

## 2022-09-11 NOTE — ED Notes (Signed)
Pt given oj grahm crackers is a/o x 4

## 2022-09-11 NOTE — Progress Notes (Signed)
Patient came back from IR, he is talking but after a lot of effort by the staff as he doesn't want to respond quickly. He refused to show the tongue as got in report that it was swollen.

## 2022-09-11 NOTE — ED Notes (Signed)
Pt will not let iv be placed or morning labs drawn states he is tired of being poked and they can draw blood and give meds in dialysis

## 2022-09-11 NOTE — ED Notes (Signed)
ED TO INPATIENT HANDOFF REPORT  ED Nurse Name and Phone #: [phone] [name] Name/Age/Gender [name] 52 y.o. male Room/Bed: H012C/H012C  Code Status   Code Status: Full Code  Home/SNF/Other Home Patient oriented to: self, place, time, and situation Is this baseline? Yes   Triage Complete: Triage complete  Chief Complaint Hemodialysis AV fistula thrombosis, initial encounter [T82.868A]  Triage Note Pt c/o bilateral leg swelling starting a couple hours ago. Endorses pain. T, Th, S dialysis - last session Tuesday.    Allergies Allergies  Allergen Reactions   Sulfa Antibiotics Rash    Severe rash.    Keflex [Cephalexin] Other (See Comments)    Patient with severe drug reaction including exfoliating skin rash and hypotension after being prescribed both cephalexin and sulfamethoxazole-trimethoprim simultaneously. Favor SMX as much more likely culprit as patient had tolerated other cephalosporins in the past, but cannot say with complete certainty that this was not related to cephalexin.    Other Other (See Comments)    Patient states he is allergic to the TB test - unknown reaction during childhood    Bactrim [Sulfamethoxazole-Trimethoprim] Rash    Level of Care/Admitting Diagnosis ED Disposition     ED Disposition  Admit   Condition  --   Comment  Hospital Area: [facility] [[id number]]  Level of Care: Telemetry Medical [104]  May place patient in observation at [facility] or [facility] if equivalent level of care is available:: No  Covid Evaluation: Confirmed COVID Negative  Diagnosis: Hemodialysis AV fistula thrombosis, initial encounter [324401]  Admitting Physician: Gery Pray [[id number]]  Attending Physician: Alvester Chou          B Medical/Surgery History Past Medical History:  Diagnosis Date   Anxiety 02/2019   Anxiety 10/2019   Diabetes mellitus type II, uncontrolled 07/17/2006   Elevated alkaline phosphatase level  01/2019   ESSENTIAL HYPERTENSION 07/17/2006   Homelessness 10/13/2021   HYPERLIPIDEMIA 11/24/2008   Insomnia 02/2019   Left bundle branch block 02/22/2019   Lesion of lip 10/2019   Pneumonia 10/22/2011   Suicidal ideations 02/2019   Vitamin D deficiency 01/2019   Past Surgical History:  Procedure Laterality Date   ARM SURGERY     AV FISTULA PLACEMENT Right 02/20/2021   Procedure: RIGHT ARTERIOVENOUS GRAFT CREATION;  Surgeon: Chuck Hint, MD;  Location: [facility] OR;  Service: Vascular;  Laterality: Right;   INSERTION OF DIALYSIS CATHETER Left 02/20/2021   Procedure: INSERTION OF DIALYSIS CATHETER USING PALINDROME CATHETER;  Surgeon: Chuck Hint, MD;  Location: [facility] OR;  Service: Vascular;  Laterality: Left;   IR AV DIALY SHUNT INTRO NEEDLE/INTRACATH INITIAL W/PTA/IMG RIGHT Right 11/06/2021   IR REMOVAL TUN CV CATH W/O FL  07/27/2021   IR US GUIDE VASC ACCESS RIGHT  11/06/2021   ULTRASOUND GUIDANCE FOR VASCULAR ACCESS  02/20/2021   Procedure: ULTRASOUND GUIDANCE FOR VASCULAR ACCESS;  Surgeon: Chuck Hint, MD;  Location: [facility] OR;  Service: Vascular;;     A IV Location/Drains/Wounds Patient Lines/Drains/Airways Status     Active Line/Drains/Airways     Name Placement date Placement time Site Days   Fistula / Graft Right Forearm --  --  Forearm  --            Intake/Output Last 24 hours No intake or output data in the 24 hours ending 09/11/22 0708  Labs/Imaging Results for orders placed or performed during the hospital encounter of 09/10/22 (from the past 48 hour(s))  Basic metabolic panel     Status: Abnormal   Collection Time: 09/11/22 12:48 AM  Result Value Ref Range   Sodium 131 (L) 135 - 145 mmol/L   Potassium 4.2 3.5 - 5.1 mmol/L   Chloride 95 (L) 98 - 111 mmol/L   CO2 22 22 - 32 mmol/L   Glucose, Bld 129 (H) 70 - 99 mg/dL    Comment: Glucose reference range applies only to samples taken after fasting for at least 8 hours.   BUN 61 (H) 6 - 20 mg/dL    Creatinine, Ser 7.82 (H) 0.61 - 1.24 mg/dL   Calcium 7.3 (L) 8.9 - 10.3 mg/dL   GFR, Estimated 6 (L) >60 mL/min    Comment: (NOTE) Calculated using the CKD-EPI Creatinine Equation (2021)    Anion gap 14 5 - 15    Comment: Performed at [facility], [address], [place], [place] [address]  CBC with Differential     Status: Abnormal   Collection Time: 09/11/22 12:48 AM  Result Value Ref Range   WBC 6.0 4.0 - 10.5 K/uL   RBC 3.43 (L) 4.22 - 5.81 MIL/uL   Hemoglobin 9.5 (L) 13.0 - 17.0 g/dL   HCT 30.8 (L) 65.7 - 84.6 %   MCV 88.0 80.0 - 100.0 fL   MCH 27.7 26.0 - 34.0 pg   MCHC 31.5 30.0 - 36.0 g/dL   RDW 96.2 (H) 95.2 - 84.1 %   Platelets 303 150 - 400 K/uL   nRBC 0.0 0.0 - 0.2 %   Neutrophils Relative % 65 %   Neutro Abs 4.0 1.7 - 7.7 K/uL   Lymphocytes Relative 19 %   Lymphs Abs 1.1 0.7 - 4.0 K/uL   Monocytes Relative 14 %   Monocytes Absolute 0.8 0.1 - 1.0 K/uL   Eosinophils Relative 0 %   Eosinophils Absolute 0.0 0.0 - 0.5 K/uL   Basophils Relative 1 %   Basophils Absolute 0.0 0.0 - 0.1 K/uL   Immature Granulocytes 1 %   Abs Immature Granulocytes 0.03 0.00 - 0.07 K/uL    Comment: Performed at [facility], [address], [place], [place] [address]  Magnesium     Status: None   Collection Time: 09/11/22 12:48 AM  Result Value Ref Range   Magnesium 2.0 1.7 - 2.4 mg/dL    Comment: Performed at [facility], [address] [place], [place] [address]  I-stat chem 8, ED     Status: Abnormal   Collection Time: 09/11/22  1:01 AM  Result Value Ref Range   Sodium 129 (L) 135 - 145 mmol/L   Potassium 4.3 3.5 - 5.1 mmol/L   Chloride 100 98 - 111 mmol/L   BUN 60 (H) 6 - 20 mg/dL   Creatinine, Ser 53.66 (H) 0.61 - 1.24 mg/dL   Glucose, Bld 440 (H) 70 - 99 mg/dL    Comment: Glucose reference range applies only to samples taken after fasting for at least 8 hours.   Calcium, Ion 0.75 (LL) 1.15 - 1.40 mmol/L   TCO2 22 22 - 32 mmol/L   Hemoglobin 11.2 (L)  13.0 - 17.0 g/dL   HCT 34.7 (L) 42.5 - 95.6 %   Comment NOTIFIED PHYSICIAN   CBG monitoring, ED     Status: Abnormal   Collection Time: 09/11/22  3:35 AM  Result Value Ref Range   Glucose-Capillary 150 (H) 70 - 99 mg/dL    Comment: Glucose reference range applies only  to samples taken after fasting for at least 8 hours.  CBG monitoring, ED     Status: Abnormal   Collection Time: 09/11/22  6:08 AM  Result Value Ref Range   Glucose-Capillary 37 (LL) 70 - 99 mg/dL    Comment: Glucose reference range applies only to samples taken after fasting for at least 8 hours.   DG Chest Portable 1 View  Result Date: 09/11/2022 CLINICAL DATA:  Shortness of breath. EXAM: PORTABLE CHEST 1 VIEW COMPARISON:  September 09, 2022 FINDINGS: The cardiac silhouette is markedly enlarged and unchanged in size. Mildly increased perihilar interstitial lung markings are seen. This is increased in severity when compared to the prior study. The area of right middle lobe consolidation seen on the prior study is no longer visualized. There is no evidence of a pleural effusion or pneumothorax. The visualized skeletal structures are unremarkable. IMPRESSION: Stable cardiomegaly with findings consistent with mild interstitial edema. Electronically Signed   By: Aram Candela M.D.   On: 09/11/2022 01:05   DG Chest 2 View  Result Date: 09/09/2022 CLINICAL DATA:  Cough EXAM: CHEST - 2 VIEW COMPARISON:  Chest x-ray dated September 08, 2022; chest x-ray dated June 09, 2022; chest CT dated May 24, 2022 FINDINGS: Unchanged cardiomegaly. Unchanged right middle lobe consolidation. No new lung opacity. Redemonstrated fracture of the distal left clavicle. No evidence of pleural effusion or pneumothorax. IMPRESSION: 1. No evidence of acute airspace opacity. 2. Right middle lobe consolidation, similar when compared with multiple prior exams. Given chronicity, consider CT for better evaluation. 3. Redemonstrated fracture of the distal left  clavicle. 4. Unchanged cardiomegaly. Electronically Signed   By: Allegra Lai M.D.   On: 09/09/2022 20:06    Pending Labs Unresulted Labs (From admission, onward)     Start     Ordered   09/11/22 0500  Basic metabolic panel  Tomorrow morning,   R        09/11/22 0309   09/11/22 0309  CBC  (heparin)  Once,   R       Comments: Baseline for heparin therapy IF NOT ALREADY DRAWN.  Notify MD if PLT < 100 K.    09/11/22 0309            Vitals/Pain Today's Vitals   09/10/22 2351 09/11/22 0010  BP: (!) 159/100   Pulse: 85   Resp: 18   Temp: 99 F (37.2 C)   SpO2: 92%   Weight:  73 kg  Height:   (1.702 m)  PainSc:  6     Isolation Precautions No active isolations  Medications Medications  amLODipine (NORVASC) tablet 10 mg (has no administration in time range)  aspirin EC tablet 81 mg (has no administration in time range)  calcium acetate (PHOSLO) capsule 2,001 mg (has no administration in time range)  carvedilol (COREG) tablet 6.25 mg (has no administration in time range)  insulin glargine-yfgn (SEMGLEE) injection 35 Units (has no administration in time range)  isosorbide-hydrALAZINE (BIDIL) 20-37.5 MG per tablet 1 tablet (has no administration in time range)  pantoprazole (PROTONIX) EC tablet 40 mg (has no administration in time range)  sevelamer carbonate (RENVELA) tablet 800 mg (has no administration in time range)  insulin aspart (novoLOG) injection 0-15 Units (2 Units Subcutaneous Given 09/11/22 0354)  heparin injection 5,000 Units (has no administration in time range)  acetaminophen (TYLENOL) tablet 650 mg (has no administration in time range)    Or  acetaminophen (TYLENOL) suppository 650 mg (has no administration  in time range)  senna-docusate (Senokot-S) tablet 1 tablet (has no administration in time range)  calcium gluconate 2 g/ 100 mL sodium chloride IVPB (has no administration in time range)  guaiFENesin (ROBITUSSIN) 100 MG/5ML liquid 5 mL (5 mLs Oral  Given 09/11/22 0608)    Mobility walks     Focused Assessments Pt needs dialysis has excess fluid build up   R Recommendations: See Admitting Provider Note  Report given to:   Additional Notes:  Pt a/o x 4 uncooperative needs IV placed calcium given labs drawn heparin and dialysis

## 2022-09-11 NOTE — Sedation Documentation (Signed)
Pt began to experience periods of snoring respirations and apnea.  Patient responsiveness becoming diminished.  Rapid response called for assistance and patient placed on high flow o2 with jaw thrust maneuver performed. Patient remains responsive with intermittent gasping respiration.

## 2022-09-11 NOTE — Progress Notes (Signed)
Patient refusing cardiac monitoring.  MD aware.

## 2022-09-11 NOTE — Procedures (Signed)
Interventional Radiology Procedure Note  Procedure:   Attempt at declot, possible tunneled catheter. Acute respiratory decompensation after sedation, reversed with narcan.   Nasal bleeding encountered after nasal trumpet placement.    Recommendations:  - Discussed with internal medicine, med-surge status - VIR to follow up for declot/line placement    Signed,  Yvone Neu. Loreta Ave, DO

## 2022-09-11 NOTE — ED Provider Notes (Signed)
EMERGENCY DEPARTMENT AT [facility] Provider Note   CSN: [id number] Arrival date & time: 09/10/22  2343     History  Chief Complaint  Patient presents with   Leg Swelling    [name] is a 52 y.o. male.  HPI   Patient with medical history including diabetes, end-stage renal disease currently on dialysis Tuesday Thursday Saturday, homelessness, hypertension, presenting with complaints of missed dialysis treatment.  Patient states that he was unable to get his dialysis treatment because his fistula was not working, states that he has not had his dialysis treatment since Saturday, states he has worsening leg swelling, as well as a cough and shortness of breath, no chest pain, no pleuritic chest pain, no fevers chills, no general body aches.  Reviewed patient's chart been seen multiple times, most recent seen yesterday, discharged home.   Home Medications Prior to Admission medications   Medication Sig Start Date End Date Taking? Authorizing Provider  acetaminophen (TYLENOL) 500 MG tablet Take 1 tablet (500 mg total) by mouth every 8 (eight) hours as needed for mild pain. 05/28/22   Leroy Sea, MD  amLODipine (NORVASC) 10 MG tablet Take 1 tablet (10 mg total) by mouth daily. 05/26/22   Leroy Sea, MD  ASPIRIN LOW DOSE 81 MG tablet Take 1 tablet (81 mg total) by mouth daily. 05/28/22   Leroy Sea, MD  benzonatate (TESSALON) 100 MG capsule Take 1 capsule (100 mg total) by mouth every 8 (eight) hours. 09/09/22   Sabas Sous, MD  calcium acetate (PHOSLO) 667 MG capsule Take 3 capsules (2,001 mg total) by mouth 3 (three) times daily. 05/28/22   Leroy Sea, MD  carvedilol (COREG) 6.25 MG tablet Take 1 tablet (6.25 mg total) by mouth 2 (two) times daily with a meal. 05/28/22   Leroy Sea, MD  doxycycline (VIBRA-TABS) 100 MG tablet Take 1 tablet (100 mg total) by mouth 2 (two) times daily for 7 days. 09/09/22 09/16/22  Sabas Sous, MD   Fingerstix Lancets MISC Use as directed up to 4 times daily. 05/28/22   Leroy Sea, MD  glucose blood test strip Use to check fasting blood sugar once daily. 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 Insulin dependent 11/29/20   Hollice Espy, MD  glucose blood test strip Use as directed up to 4 times daily 05/28/22   Leroy Sea, MD  insulin aspart (NOVOLOG) 100 UNIT/ML FlexPen Before each meal 3 times a day, 140-199 - 2 units, 200-250 - 4 units, 251-299 - 6 units,  300-349 - 8 units,  350 or above 10 units. 05/28/22   Leroy Sea, MD  insulin detemir (LEVEMIR) 100 UNIT/ML injection Inject 0.35 mLs (35 Units total) into the skin daily. max daily dose 100 units per day 06/18/22     insulin glargine (LANTUS SOLOSTAR) 100 UNIT/ML Solostar Pen Inject 35 Units into the skin daily.Not to exceed 100 units per day. 06/19/22     Insulin Pen Needle 32G X 4 MM MISC Use with insulin pens 05/28/22   Leroy Sea, MD  Insulin Syringe-Needle U-100 25G X 1" 1 ML MISC For 4 times a day insulin SQ, 1 month supply. Diagnosis E11.65 05/28/22   Leroy Sea, MD  isosorbide-hydrALAZINE (BIDIL) 20-37.5 MG tablet Take 1 tablet by mouth 3 (three) times daily. 07/20/22     levofloxacin (LEVAQUIN) 500 MG tablet Take 1 tablet (500 mg total) by mouth daily for 5 days. 09/08/22 09/14/22  Regalado, Belkys A, MD  oxyCODONE-acetaminophen (PERCOCET/ROXICET) 5-325 MG tablet Take 1 tablet by mouth every 12 (twelve) hours as needed for severe pain. 07/04/22   Derwood Kaplan, MD  PROTONIX 40 MG tablet Take 1 tablet (40 mg total) by mouth daily. 05/25/22   Leroy Sea, MD  sevelamer carbonate (RENVELA) 800 MG tablet Take 2 tablets (1,600 mg total) by mouth 3 (three) times daily with meals. May also take 1 tablet (800 mg total) 2 (two) times daily as needed with snacks. 05/28/22   Leroy Sea, MD  gabapentin (NEURONTIN) 300 MG capsule Take 1 capsule (300 mg total) by mouth 3 (three) times daily. Patient not taking: Reported on 07/27/2019  07/29/18 08/30/19  Kallie Locks, FNP  sildenafil (VIAGRA) 25 MG tablet Take 1 tablet (25 mg total) by mouth as needed for erectile dysfunction. for erectile dysfunction. Patient not taking: Reported on 08/11/2020 10/13/19 11/27/20  Kallie Locks, FNP      Allergies    Sulfa antibiotics, Keflex [cephalexin], Other, and Bactrim [sulfamethoxazole-trimethoprim]    Review of Systems   Review of Systems  Constitutional:  Negative for chills and fever.  Respiratory:  Positive for shortness of breath.   Cardiovascular:  Positive for leg swelling. Negative for chest pain.  Gastrointestinal:  Negative for abdominal pain.  Neurological:  Negative for headaches.    Physical Exam Updated Vital Signs BP (!) 159/100 (BP Location: Left Arm)   Pulse 85   Temp 99 F (37.2 C)   Resp 18   Ht  (1.702 m)   Wt 73 kg   SpO2 92%   BMI 25.21 kg/m  Physical Exam Vitals and nursing note reviewed.  Constitutional:      General: He is not in acute distress.    Appearance: He is not ill-appearing.  HENT:     Head: Normocephalic and atraumatic.     Nose: No congestion.  Eyes:     Conjunctiva/sclera: Conjunctivae normal.  Cardiovascular:     Rate and Rhythm: Normal rate and regular rhythm.     Pulses: Normal pulses.     Heart sounds: No murmur heard.    No friction rub. No gallop.  Pulmonary:     Effort: No respiratory distress.     Breath sounds: No wheezing, rhonchi or rales.     Comments: Slight lower lobe Rales heard bilaterally worse on the right versus the left Musculoskeletal:     Right lower leg: Edema present.     Left lower leg: Edema present.     Comments: AV fistula right arm, palpable pulse without thrill, cannot auscultate a pulse.  Patient has gross motor function in the right upper extremity, he has 2+ radial pulses, 2-second capillary refill sensation intact light touch.  Patient has as 1+ pitting edema bilaterally up to the shins.  Skin:    General: Skin is warm and  dry.  Neurological:     Mental Status: He is alert.  Psychiatric:        Mood and Affect: Mood normal.     ED Results / Procedures / Treatments   Labs (all labs ordered are listed, but only abnormal results are displayed) Labs Reviewed  BASIC METABOLIC PANEL - Abnormal; Notable for the following components:      Result Value   Sodium 131 (*)    Chloride 95 (*)    Glucose, Bld 129 (*)    BUN 61 (*)    Creatinine, Ser 9.43 (*)  Calcium 7.3 (*)    GFR, Estimated 6 (*)    All other components within normal limits  CBC WITH DIFFERENTIAL/PLATELET - Abnormal; Notable for the following components:   RBC 3.43 (*)    Hemoglobin 9.5 (*)    HCT 30.2 (*)    RDW 16.9 (*)    All other components within normal limits  I-STAT CHEM 8, ED - Abnormal; Notable for the following components:   Sodium 129 (*)    BUN 60 (*)    Creatinine, Ser 11.40 (*)    Glucose, Bld 127 (*)    Calcium, Ion 0.75 (*)    Hemoglobin 11.2 (*)    HCT 33.0 (*)    All other components within normal limits  MAGNESIUM  CBC  BASIC METABOLIC PANEL    EKG EKG Interpretation  Date/Time:  Wednesday September 11 2022 00:56:59 EDT Ventricular Rate:  82 PR Interval:  144 QRS Duration: 150 QT Interval:  450 QTC Calculation: 525 R Axis:   226 Text Interpretation: Normal sinus rhythm Biatrial enlargement Right superior axis deviation Left bundle branch block Abnormal ECG When compared with ECG of 07-Sep-2022 00:31, PREVIOUS ECG IS PRESENT Confirmed by Marily Memos [phone]) on 09/11/2022 1:08:34 AM  Radiology DG Chest Portable 1 View  Result Date: 09/11/2022 CLINICAL DATA:  Shortness of breath. EXAM: PORTABLE CHEST 1 VIEW COMPARISON:  September 09, 2022 FINDINGS: The cardiac silhouette is markedly enlarged and unchanged in size. Mildly increased perihilar interstitial lung markings are seen. This is increased in severity when compared to the prior study. The area of right middle lobe consolidation seen on the prior study is no  longer visualized. There is no evidence of a pleural effusion or pneumothorax. The visualized skeletal structures are unremarkable. IMPRESSION: Stable cardiomegaly with findings consistent with mild interstitial edema. Electronically Signed   By: Aram Candela M.D.   On: 09/11/2022 01:05   DG Chest 2 View  Result Date: 09/09/2022 CLINICAL DATA:  Cough EXAM: CHEST - 2 VIEW COMPARISON:  Chest x-ray dated September 08, 2022; chest x-ray dated June 09, 2022; chest CT dated May 24, 2022 FINDINGS: Unchanged cardiomegaly. Unchanged right middle lobe consolidation. No new lung opacity. Redemonstrated fracture of the distal left clavicle. No evidence of pleural effusion or pneumothorax. IMPRESSION: 1. No evidence of acute airspace opacity. 2. Right middle lobe consolidation, similar when compared with multiple prior exams. Given chronicity, consider CT for better evaluation. 3. Redemonstrated fracture of the distal left clavicle. 4. Unchanged cardiomegaly. Electronically Signed   By: Allegra Lai M.D.   On: 09/09/2022 20:06    Procedures Procedures    Medications Ordered in ED Medications  amLODipine (NORVASC) tablet 10 mg (has no administration in time range)  aspirin EC tablet 81 mg (has no administration in time range)  calcium acetate (PHOSLO) capsule 2,001 mg (has no administration in time range)  carvedilol (COREG) tablet 6.25 mg (has no administration in time range)  insulin glargine-yfgn (SEMGLEE) injection 35 Units (has no administration in time range)  isosorbide-hydrALAZINE (BIDIL) 20-37.5 MG per tablet 1 tablet (has no administration in time range)  pantoprazole (PROTONIX) EC tablet 40 mg (has no administration in time range)  sevelamer carbonate (RENVELA) tablet 800 mg (has no administration in time range)  insulin aspart (novoLOG) injection 0-15 Units (has no administration in time range)  heparin injection 5,000 Units (has no administration in time range)  acetaminophen  (TYLENOL) tablet 650 mg (has no administration in time range)    Or  acetaminophen (  TYLENOL) suppository 650 mg (has no administration in time range)  senna-docusate (Senokot-S) tablet 1 tablet (has no administration in time range)    ED Course/ Medical Decision Making/ A&P                             Medical Decision Making Amount and/or Complexity of Data Reviewed Labs: ordered. Radiology: ordered.   This patient presents to the ED for concern of malfunctioning fistula, this involves an extensive number of treatment options, and is a complaint that carries with it a high risk of complications and morbidity.  The differential diagnosis includes limb ischemia, thrombosed fistula, electrolyte derailment, CHF    Additional history obtained:  Additional history obtained from N/A External records from outside source obtained and reviewed including recent ED notes   Co morbidities that complicate the patient evaluation  End-stage renal disease on dialysis  Social Determinants of Health:  Homelessness    Lab Tests:  I Ordered, and personally interpreted labs.  The pertinent results include: CBC shows normocytic anemia hemoglobin 9.5 at baseline, BMP reveals sodium 131 chloride 95 glucose 129 BUN 61 creatinine 9.4 calcium 7.3,   Imaging Studies ordered:  I ordered imaging studies including chest x-ray I independently visualized and interpreted imaging which showed cardiiomegaly with vascular congestion I agree with the radiologist interpretation   Cardiac Monitoring:  The patient was maintained on a cardiac monitor.  I personally viewed and interpreted the cardiac monitored which showed an underlying rhythm of: EKG without signs of ischemia   Medicines ordered and prescription drug management:  I ordered medication including N/A I have reviewed the patients home medicines and have made adjustments as needed  Critical  Interventions:  N/A   Reevaluation:  Presenting with complaints of nonworking fistula, exam is concerning for fistula possibly thrombosed, we will consult with vascular surgery for further recommendations  Reassessed the patient patient agreement this plan will consult with hospitalist for admission  Consultations Obtained:  I requested consultation with the Dr. Myra Gianotti,  and discussed lab and imaging findings as well as pertinent plan - they recommend: Likely a thrombosed fistula, these can typically be addressed as an outpatient, does recommend checking lab work, ensuring that patient does not need emergent hemodialysis and then consulting with nephrology.  Patient needs dialysis catheter placement and will be available for consultation. Spoke with Dr. Joneen Roach who will admit the patient    Test Considered:  N/a    Rule out Suspicion for emergent hemodialysis is low, patient has no new oxygen requirements, no significant electrolyte derailments.  I doubt limb ischemia the right upper extremity as he has 2+ radial pulses, 2-second capillary refill, motor function grossly intact sensation intact to light touch.  I doubt DVT as he has no swelling of the arm, no palpable cords.    Dispostion and problem list  After consideration of the diagnostic results and the patients response to treatment, I feel that the patent would benefit from admission.  Malfunctioning fistula-likely patient has thrombosed right AC fistula, will need formal evaluation by IR, and likely need dialysis.            Final Clinical Impression(s) / ED Diagnoses Final diagnoses:  Malfunction of arteriovenous dialysis fistula, initial encounter    Rx / DC Orders ED Discharge Orders     None         Barnie Del 09/11/22 5784    Marily Memos, MD 09/11/22  0628  

## 2022-09-11 NOTE — ED Notes (Signed)
Patient refusing vital signs

## 2022-09-11 NOTE — Progress Notes (Signed)
52 year old male (homeless )with ESRD on HD TTS [facility] last HD was 09/06/22  Friday 2/2  missing 2 treatments to prior week.  Presented a.m. 4/23  [facility]  FOR hd  clotted RUE AVG with plans for this 4/24 a.m. [facility] vascular declot but he showed up in the ER last night.   Admitted to observation Cannot tell me why he showed up here.   noted in ER refusing meds.  IR to declot was placed PermCath today then HD here.    Currently not in distress chest x-ray able centimeters with findings of mild interstitial edema.  Today 129, K4.3, BUN 60, CR 11.4, Hgb 11.2. We will provide HD after Access work /full consult if admitted to inpatient  Lenny Pastel, PA-C [facility] Beeper [phone] 09/11/2022,9:09 AM  LOS: 0 days   Labs: Basic Metabolic Panel: Recent Labs  Lab 09/08/22 0340 09/09/22 1910 09/11/22 0048 09/11/22 0101  NA 132* 134* 131* 129*  K 3.9 4.2 4.2 4.3  CL 95* 96* 95* 100  CO2 --   GLUCOSE 216* 179* 129* 127*  BUN 33* 47* 61* 60*  CREATININE 5.90* 7.89* 9.43* 11.40*  CALCIUM 7.9* 8.0* 7.3*  --   PHOS 3.6  --   --   --    Liver Function Tests: Recent Labs  Lab 09/06/22 0016 09/07/22 0010 09/08/22 0340  AST 56* 44* 73*  ALT 47* 42 54*  ALKPHOS 475* 382* 341*  BILITOT 0.7 0.9 0.8  PROT 7.6 7.4 7.0  ALBUMIN 3.2* 3.1* 2.8*   No results for input(s): "LIPASE", "AMYLASE" in the last 168 hours. No results for input(s): "AMMONIA" in the last 168 hours. CBC: Recent Labs  Lab 09/05/22 2245 09/06/22 0010 09/07/22 0010 09/08/22 0340 09/09/22 1910 09/11/22 0048 09/11/22 0101  WBC 6.0  --  8.6 7.4 5.4 6.0  --   NEUTROABS 3.8  --  6.9 5.9  --  4.0  --   HGB 10.3*   < > 9.5* 9.3* 10.3* 9.5* 11.2*  HCT 34.0*   < > 29.5* 29.3* 33.0* 30.2* 33.0*  MCV 92.1  --  86.8 86.9 87.8 88.0  --   PLT 348  --  345 322 318 303  --    < > = values in this interval not displayed.   Cardiac Enzymes: No results for input(s): "CKTOTAL", "CKMB", "CKMBINDEX",  "TROPONINI" in the last 168 hours. CBG: Recent Labs  Lab 09/07/22 1246 09/11/22 0335 09/11/22 0608  GLUCAP 135* 150* 37*    Studies/Results: DG Chest Portable 1 View  Result Date: 09/11/2022 CLINICAL DATA:  Shortness of breath. EXAM: PORTABLE CHEST 1 VIEW COMPARISON:  September 09, 2022 FINDINGS: The cardiac silhouette is markedly enlarged and unchanged in size. Mildly increased perihilar interstitial lung markings are seen. This is increased in severity when compared to the prior study. The area of right middle lobe consolidation seen on the prior study is no longer visualized. There is no evidence of a pleural effusion or pneumothorax. The visualized skeletal structures are unremarkable. IMPRESSION: Stable cardiomegaly with findings consistent with mild interstitial edema. Electronically Signed   By: Aram Candela M.D.   On: 09/11/2022 01:05   DG Chest 2 View  Result Date: 09/09/2022 CLINICAL DATA:  Cough EXAM: CHEST - 2 VIEW COMPARISON:  Chest x-ray dated September 08, 2022; chest x-ray dated June 09, 2022; chest CT dated May 24, 2022 FINDINGS: Unchanged cardiomegaly. Unchanged right middle lobe consolidation. No new lung  opacity. Redemonstrated fracture of the distal left clavicle. No evidence of pleural effusion or pneumothorax. IMPRESSION: 1. No evidence of acute airspace opacity. 2. Right middle lobe consolidation, similar when compared with multiple prior exams. Given chronicity, consider CT for better evaluation. 3. Redemonstrated fracture of the distal left clavicle. 4. Unchanged cardiomegaly. Electronically Signed   By: Allegra Lai M.D.   On: 09/09/2022 20:06   Medications:  calcium gluconate      amLODipine  10 mg Oral Daily   aspirin EC  81 mg Oral Daily   calcium acetate  2,001 mg Oral TID with meals   carvedilol  6.25 mg Oral BID WC   heparin  5,000 Units Subcutaneous Q8H   insulin aspart  0-15 Units Subcutaneous Q4H   [START ON 09/12/2022] insulin glargine-yfgn  15  Units Subcutaneous Daily   isosorbide-hydrALAZINE  1 tablet Oral TID   pantoprazole  40 mg Oral Daily   sevelamer carbonate  800 mg Oral TID WC

## 2022-09-11 NOTE — ED Notes (Signed)
PT refusing to let me obtain his Temp to update vitals. Asked PT four times. Informed his nurse .

## 2022-09-11 NOTE — Progress Notes (Signed)
   09/11/22 1700  Spiritual Encounters  Type of Visit Initial  Care provided to: Patient  Referral source Code page  Reason for visit Code  OnCall Visit No   Ch responded to code blue at IR. There was no family at bedside. When arrived, doctor cancelled code blue. No follow-up needed at this time.

## 2022-09-11 NOTE — Progress Notes (Signed)
Brief Nephrology Note  Called by EDP.  ESRD [facility] last HD 4/20 presented with edema.  EDP noted no sig bruit / thrill in RUA AVG.    K 4.2.  On RA, BPs mildly up not unusual for him.   Plan for IR eval for declot and TDC if not successful, ordered.  HD thereafter.  Will see in AM

## 2022-09-11 NOTE — H&P (Signed)
PCP:   [name]   Chief Complaint:  Malfunctioning AV fistula  HPI: This is a 52 year old male with past medical history of ESRD, HTN, HLD, homelessness, and anxiety.  He presents with AV fistula malfunction.  He was hemodialysis and was unable to be dialyzed secondary to his fistula malfunction.  They have been contacting nephrology who recommended admission.  Dr. Marisue Humble recommend overnight observation. He will contact IR in a.m.  Patient likely thrombosed AV fistula.  He plans  to hemodialyze patient once Fistula fixed  All history provide by EDP. Patient sitting up and sleeping. Declined to answer questions  Review of Systems:  Unable to obtain  Past Medical History: Past Medical History:  Diagnosis Date   Anxiety 02/2019   Anxiety 10/2019   Diabetes mellitus type II, uncontrolled 07/17/2006   Elevated alkaline phosphatase level 01/2019   ESSENTIAL HYPERTENSION 07/17/2006   Homelessness 10/13/2021   HYPERLIPIDEMIA 11/24/2008   Insomnia 02/2019   Left bundle branch block 02/22/2019   Lesion of lip 10/2019   Pneumonia 10/22/2011   Suicidal ideations 02/2019   Vitamin D deficiency 01/2019   Past Surgical History:  Procedure Laterality Date   ARM SURGERY     AV FISTULA PLACEMENT Right 02/20/2021   Procedure: RIGHT ARTERIOVENOUS GRAFT CREATION;  Surgeon: Chuck Hint, MD;  Location: [facility] OR;  Service: Vascular;  Laterality: Right;   INSERTION OF DIALYSIS CATHETER Left 02/20/2021   Procedure: INSERTION OF DIALYSIS CATHETER USING PALINDROME CATHETER;  Surgeon: Chuck Hint, MD;  Location: [facility] OR;  Service: Vascular;  Laterality: Left;   IR AV DIALY SHUNT INTRO NEEDLE/INTRACATH INITIAL W/PTA/IMG RIGHT Right 11/06/2021   IR REMOVAL TUN CV CATH W/O FL  07/27/2021   IR US GUIDE VASC ACCESS RIGHT  11/06/2021   ULTRASOUND GUIDANCE FOR VASCULAR ACCESS  02/20/2021   Procedure: ULTRASOUND GUIDANCE FOR VASCULAR ACCESS;  Surgeon: Chuck Hint, MD;  Location: [facility] OR;   Service: Vascular;;    Medications: Prior to Admission medications   Medication Sig Start Date End Date Taking? Authorizing Provider  acetaminophen (TYLENOL) 500 MG tablet Take 1 tablet (500 mg total) by mouth every 8 (eight) hours as needed for mild pain. 05/28/22   Leroy Sea, MD  amLODipine (NORVASC) 10 MG tablet Take 1 tablet (10 mg total) by mouth daily. 05/26/22   Leroy Sea, MD  ASPIRIN LOW DOSE 81 MG tablet Take 1 tablet (81 mg total) by mouth daily. 05/28/22   Leroy Sea, MD  benzonatate (TESSALON) 100 MG capsule Take 1 capsule (100 mg total) by mouth every 8 (eight) hours. 09/09/22   Sabas Sous, MD  calcium acetate (PHOSLO) 667 MG capsule Take 3 capsules (2,001 mg total) by mouth 3 (three) times daily. 05/28/22   Leroy Sea, MD  carvedilol (COREG) 6.25 MG tablet Take 1 tablet (6.25 mg total) by mouth 2 (two) times daily with a meal. 05/28/22   Leroy Sea, MD  doxycycline (VIBRA-TABS) 100 MG tablet Take 1 tablet (100 mg total) by mouth 2 (two) times daily for 7 days. 09/09/22 09/16/22  Sabas Sous, MD  Fingerstix Lancets MISC Use as directed up to 4 times daily. 05/28/22   Leroy Sea, MD  glucose blood test strip Use to check fasting blood sugar once daily. 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 Insulin dependent 11/29/20   Hollice Espy, MD  glucose blood test strip Use as directed up to 4 times daily 05/28/22   Leroy Sea, MD  insulin aspart (NOVOLOG) 100 UNIT/ML FlexPen Before each meal 3 times a day, 140-199 - 2 units, 200-250 - 4 units, 251-299 - 6 units,  300-349 - 8 units,  350 or above 10 units. 05/28/22   Leroy Sea, MD  insulin detemir (LEVEMIR) 100 UNIT/ML injection Inject 0.35 mLs (35 Units total) into the skin daily. max daily dose 100 units per day 06/18/22     insulin glargine (LANTUS SOLOSTAR) 100 UNIT/ML Solostar Pen Inject 35 Units into the skin daily.Not to exceed 100 units per day. 06/19/22     Insulin Pen Needle 32G X 4 MM MISC Use with  insulin pens 05/28/22   Leroy Sea, MD  Insulin Syringe-Needle U-100 25G X 1" 1 ML MISC For 4 times a day insulin SQ, 1 month supply. Diagnosis E11.65 05/28/22   Leroy Sea, MD  isosorbide-hydrALAZINE (BIDIL) 20-37.5 MG tablet Take 1 tablet by mouth 3 (three) times daily. 07/20/22     levofloxacin (LEVAQUIN) 500 MG tablet Take 1 tablet (500 mg total) by mouth daily for 5 days. 09/08/22 09/14/22  Regalado, Jon Billings A, MD  oxyCODONE-acetaminophen (PERCOCET/ROXICET) 5-325 MG tablet Take 1 tablet by mouth every 12 (twelve) hours as needed for severe pain. 07/04/22   Derwood Kaplan, MD  PROTONIX 40 MG tablet Take 1 tablet (40 mg total) by mouth daily. 05/25/22   Leroy Sea, MD  sevelamer carbonate (RENVELA) 800 MG tablet Take 2 tablets (1,600 mg total) by mouth 3 (three) times daily with meals. May also take 1 tablet (800 mg total) 2 (two) times daily as needed with snacks. 05/28/22   Leroy Sea, MD  gabapentin (NEURONTIN) 300 MG capsule Take 1 capsule (300 mg total) by mouth 3 (three) times daily. Patient not taking: Reported on 07/27/2019 07/29/18 08/30/19  Kallie Locks, FNP  sildenafil (VIAGRA) 25 MG tablet Take 1 tablet (25 mg total) by mouth as needed for erectile dysfunction. for erectile dysfunction. Patient not taking: Reported on 08/11/2020 10/13/19 11/27/20  Kallie Locks, FNP    Allergies:   Allergies  Allergen Reactions   Sulfa Antibiotics Rash    Severe rash.    Keflex [Cephalexin] Other (See Comments)    Patient with severe drug reaction including exfoliating skin rash and hypotension after being prescribed both cephalexin and sulfamethoxazole-trimethoprim simultaneously. Favor SMX as much more likely culprit as patient had tolerated other cephalosporins in the past, but cannot say with complete certainty that this was not related to cephalexin.    Other Other (See Comments)    Patient states he is allergic to the TB test - unknown reaction during childhood    Bactrim  [Sulfamethoxazole-Trimethoprim] Rash    Social History:  reports that he has never smoked. He has never used smokeless tobacco. He reports that he does not drink alcohol and does not use drugs.  Family History: Family History  Problem Relation Age of Onset   Hypertension Mother 46   Cancer Mother        bladder cancer   Alcohol abuse Father    Hypertension Brother     Physical Exam: Vitals:   09/10/22 2351 09/11/22 0010  BP: (!) 159/100   Pulse: 85   Resp: 18   Temp: 99 F (37.2 C)   SpO2: 92%   Weight:  73 kg  Height:  5\' 7"  (1.702 m)    General:  Alert and oriented times three, no acute distress ENT: Moist oral mucosa, Lungs: clear to ascultation, no wheeze,  no crackles, no use of accessory muscles Cardiovascular: regular rate and rhythm Abdomen: soft, positive BS, non-tender, non-distended, no organomegaly, not an acute abdomen GU: not examined Neuro: appears CN II - XII grossly intact,  Musculoskeletal: appears to move all extremities equally. 3+ B/L LE edema, xerosis Skin: no rash, no subcutaneous crepitation, no decubitus   Labs on Admission:  Recent Labs    09/08/22 0340 09/09/22 1910 09/11/22 0048 09/11/22 0101  NA 132* 134* 131* 129*  K 3.9 4.2 4.2 4.3  CL 95* 96* 95* 100  CO2 --   GLUCOSE 216* 179* 129* 127*  BUN 33* 47* 61* 60*  CREATININE 5.90* 7.89* 9.43* 11.40*  CALCIUM 7.9* 8.0* 7.3*  --   MG 2.0  --  2.0  --   PHOS 3.6  --   --   --    Recent Labs    09/08/22 0340  AST 73*  ALT 54*  ALKPHOS 341*  BILITOT 0.8  PROT 7.0  ALBUMIN 2.8*    Recent Labs    09/08/22 0340 09/09/22 1910 09/11/22 0048 09/11/22 0101  WBC 7.4 5.4 6.0  --   NEUTROABS 5.9  --  4.0  --   HGB 9.3* 10.3* 9.5* 11.2*  HCT 29.3* 33.0* 30.2* 33.0*  MCV 86.9 87.8 88.0  --   PLT 322 318 303  --     Recent Labs    09/08/22 0340  HGBA1C 7.7*    Micro Results: Recent Results (from the past 240 hour(s))  Blood Culture (routine x 2)     Status:  None (Preliminary result)   Collection Time: 09/07/22 12:00 AM   Specimen: BLOOD LEFT ARM  Result Value Ref Range Status   Specimen Description BLOOD LEFT ARM  Final   Special Requests   Final    BOTTLES DRAWN AEROBIC AND ANAEROBIC Blood Culture adequate volume   Culture   Final    NO GROWTH 3 DAYS Performed at [facility], [address] [place], [place] [address]    Report Status PENDING  Incomplete  Blood Culture (routine x 2)     Status: None (Preliminary result)   Collection Time: 09/07/22 12:17 AM   Specimen: BLOOD LEFT HAND  Result Value Ref Range Status   Specimen Description BLOOD LEFT HAND  Final   Special Requests   Final    BOTTLES DRAWN AEROBIC ONLY Blood Culture adequate volume   Culture   Final    NO GROWTH 3 DAYS Performed at [facility], [address] [place], [place] [address]    Report Status PENDING  Incomplete     Radiological Exams on Admission: DG Chest Portable 1 View  Result Date: 09/11/2022 CLINICAL DATA:  Shortness of breath. EXAM: PORTABLE CHEST 1 VIEW COMPARISON:  September 09, 2022 FINDINGS: The cardiac silhouette is markedly enlarged and unchanged in size. Mildly increased perihilar interstitial lung markings are seen. This is increased in severity when compared to the prior study. The area of right middle lobe consolidation seen on the prior study is no longer visualized. There is no evidence of a pleural effusion or pneumothorax. The visualized skeletal structures are unremarkable. IMPRESSION: Stable cardiomegaly with findings consistent with mild interstitial edema. Electronically Signed   By: Aram Candela M.D.   On: 09/11/2022 01:05   DG Chest 2 View  Result Date: 09/09/2022 CLINICAL DATA:  Cough EXAM: CHEST - 2 VIEW COMPARISON:  Chest x-ray dated September 08, 2022; chest  x-ray dated June 09, 2022; chest CT dated May 24, 2022 FINDINGS: Unchanged cardiomegaly. Unchanged right middle lobe consolidation. No new lung opacity.  Redemonstrated fracture of the distal left clavicle. No evidence of pleural effusion or pneumothorax. IMPRESSION: 1. No evidence of acute airspace opacity. 2. Right middle lobe consolidation, similar when compared with multiple prior exams. Given chronicity, consider CT for better evaluation. 3. Redemonstrated fracture of the distal left clavicle. 4. Unchanged cardiomegaly. Electronically Signed   By: Allegra Lai M.D.   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

## 2022-09-11 NOTE — ED Notes (Signed)
Hospitalist at bedside 

## 2022-09-11 NOTE — ED Triage Notes (Signed)
Pt c/o bilateral leg swelling starting a couple hours ago. Endorses pain. T, Th, S dialysis - last session Tuesday.

## 2022-09-11 NOTE — Progress Notes (Signed)
INitially when came to our floor, he didn't had any IV access and also calcium gluconate not given.  He was refusing everything before, he agreed to have an IV now so placed an order for IV team.

## 2022-09-11 NOTE — ED Notes (Signed)
Dialysis pt to room via wc c/o bilateral feet swelling and pain. Gets dialysis Tues Thurs Sat states he has not missed a session. Pt is here for 4 th time this week same complaints and 6th time this month.Pt wearing blue paper scrubs.A/o x 4 resp even and non labored able to speak in full complete sentences. Fistula in right fa + thrill/bruite.Pt has 2+ pitting edema to both feet is able to stand and walk with steady gait. PT pending consult to admit nephrology and IR

## 2022-09-12 ENCOUNTER — Inpatient Hospital Stay (HOSPITAL_COMMUNITY): Payer: Medicaid Other

## 2022-09-12 DIAGNOSIS — T82868A Thrombosis of vascular prosthetic devices, implants and grafts, initial encounter: Secondary | ICD-10-CM | POA: Diagnosis not present

## 2022-09-12 HISTORY — PX: IR FLUORO GUIDE CV LINE RIGHT: IMG2283

## 2022-09-12 HISTORY — PX: IR US GUIDE VASC ACCESS RIGHT: IMG2390

## 2022-09-12 LAB — GLUCOSE, CAPILLARY
Glucose-Capillary: 103 mg/dL — ABNORMAL HIGH (ref 70–99)
Glucose-Capillary: 105 mg/dL — ABNORMAL HIGH (ref 70–99)
Glucose-Capillary: 108 mg/dL — ABNORMAL HIGH (ref 70–99)
Glucose-Capillary: 143 mg/dL — ABNORMAL HIGH (ref 70–99)
Glucose-Capillary: 153 mg/dL — ABNORMAL HIGH (ref 70–99)
Glucose-Capillary: 163 mg/dL — ABNORMAL HIGH (ref 70–99)

## 2022-09-12 LAB — CULTURE, BLOOD (ROUTINE X 2)
Special Requests: ADEQUATE
Special Requests: ADEQUATE

## 2022-09-12 MED ORDER — CHLORHEXIDINE GLUCONATE CLOTH 2 % EX PADS
6.0000 | MEDICATED_PAD | Freq: Every day | CUTANEOUS | Status: DC
  Administered 2022-09-12: 6 via TOPICAL

## 2022-09-12 MED ORDER — HEPARIN SODIUM (PORCINE) 1000 UNIT/ML IJ SOLN
INTRAMUSCULAR | Status: AC
  Filled 2022-09-12: qty 10

## 2022-09-12 MED ORDER — LIDOCAINE-EPINEPHRINE 1 %-1:100000 IJ SOLN
INTRAMUSCULAR | Status: AC
  Filled 2022-09-12: qty 1

## 2022-09-12 NOTE — Progress Notes (Signed)
Pt laying in bed stating he does not feel well. See vital signs for 1649. RN placed pt on oxygen and it returned to 100% Provider notified

## 2022-09-12 NOTE — Progress Notes (Signed)
Pt declines tele monitor. Pt declines long acting insulin, he states his blood sugar dropped yesterday and that is why he doesn't want it today. Pt agreeable to short acting insulin after RN explained reasoning for order.

## 2022-09-12 NOTE — Progress Notes (Signed)
RN walked into pt room to pt sitting on side of bed wet from showering. RN sternly emphasized that pt may not shower with dialysis cath in right IJ. Dressing damp and loose, sutures remain intact, RN applied more tape to secure cath. Provider made aware.

## 2022-09-12 NOTE — Progress Notes (Signed)
Educated pt on the importance of wearing telemetry. Despite verbal prompts and encouragement pt refuses to keep telemetry monitor on. Dr.Mansy notified.

## 2022-09-12 NOTE — Consult Note (Addendum)
[facility] Renal Consultation Note  Indication for Consultation:  Management of ESRD/hemodialysis; anemia, hypertension/volume and secondary hyperparathyroidism  Noted now admitted from observation to inpatient  Status after  Yesterday IR unable to perform HD  Access declot = fortunately acute respiratory decompensation after sedation was given in the  IR department.  Sedation was reversed with Narcan, patient was taken back to 61M  floor .noted "VIR to follow-up for declot line placement "  Noted  history as yesterday  HPI: [name] is a 52 y.o. male  (homeless )with ESRD on HD TTS [facility] last HD was 09/06/22  Friday 2/2  missing 2 treatments to prior week.  Presented a.m. 4/23  [facility]  FOR hd  clotted RUE AVG with plans for this 4/24 a.m. [facility] vascular declot but he showed up in the  [facility] 09/10/22.  He cannot really give me explanation yesterday why.  Has a history of noncompliance with outpatient HD and medications.  Further history as listed below  Currently in bed with covers over  head asleep.  Awoken from sleep.  Does pull over covers and asked me how "how long  is this going to take "and  no complaints currently, no shortness of breath or chest pain      Past Medical History:  Diagnosis Date   Anxiety 02/2019   Anxiety 10/2019   Diabetes mellitus type II, uncontrolled 07/17/2006   Elevated alkaline phosphatase level 01/2019   ESSENTIAL HYPERTENSION 07/17/2006   Homelessness 10/13/2021   HYPERLIPIDEMIA 11/24/2008   Insomnia 02/2019   Left bundle branch block 02/22/2019   Lesion of lip 10/2019   Pneumonia 10/22/2011   Suicidal ideations 02/2019   Vitamin D deficiency 01/2019    Past Surgical History:  Procedure Laterality Date   ARM SURGERY     AV FISTULA PLACEMENT Right 02/20/2021   Procedure: RIGHT ARTERIOVENOUS GRAFT CREATION;  Surgeon: Chuck Hint, MD;  Location: [facility] OR;  Service: Vascular;  Laterality: Right;   INSERTION OF DIALYSIS CATHETER Left  02/20/2021   Procedure: INSERTION OF DIALYSIS CATHETER USING PALINDROME CATHETER;  Surgeon: Chuck Hint, MD;  Location: [facility] OR;  Service: Vascular;  Laterality: Left;   IR AV DIALY SHUNT INTRO NEEDLE/INTRACATH INITIAL W/PTA/IMG RIGHT Right 11/06/2021   IR REMOVAL TUN CV CATH W/O FL  07/27/2021   IR THROMBECTOMY AV FISTULA W/THROMBOLYSIS INC/SHUNT/IMG RIGHT  09/11/2022   IR US GUIDE VASC ACCESS RIGHT  11/06/2021   IR US GUIDE VASC ACCESS RIGHT  09/11/2022   ULTRASOUND GUIDANCE FOR VASCULAR ACCESS  02/20/2021   Procedure: ULTRASOUND GUIDANCE FOR VASCULAR ACCESS;  Surgeon: Chuck Hint, MD;  Location: [facility] OR;  Service: Vascular;;      Family History  Problem Relation Age of Onset   Hypertension Mother 9   Cancer Mother        bladder cancer   Alcohol abuse Father    Hypertension Brother       reports that he has never smoked. He has never used smokeless tobacco. He reports that he does not drink alcohol and does not use drugs.   Allergies  Allergen Reactions   Sulfa Antibiotics Rash    Severe rash.    Keflex [Cephalexin] Other (See Comments)    Patient with severe drug reaction including exfoliating skin rash and hypotension after being prescribed both cephalexin and sulfamethoxazole-trimethoprim simultaneously. Favor SMX as much more likely culprit as patient had tolerated other cephalosporins in the past, but cannot say with  complete certainty that this was not related to cephalexin.    Other Other (See Comments)    Patient states he is allergic to the TB test - unknown reaction during childhood    Bactrim [Sulfamethoxazole-Trimethoprim] Rash    Prior to Admission medications   Medication Sig Start Date End Date Taking? Authorizing Provider  acetaminophen (TYLENOL) 500 MG tablet Take 1 tablet (500 mg total) by mouth every 8 (eight) hours as needed for mild pain. Patient not taking: Reported on 09/11/2022 05/28/22   Leroy Sea, MD  amLODipine (NORVASC) 10 MG  tablet Take 1 tablet (10 mg total) by mouth daily. Patient not taking: Reported on 09/11/2022 05/26/22   Leroy Sea, MD  ASPIRIN LOW DOSE 81 MG tablet Take 1 tablet (81 mg total) by mouth daily. Patient not taking: Reported on 09/11/2022 05/28/22   Leroy Sea, MD  benzonatate (TESSALON) 100 MG capsule Take 1 capsule (100 mg total) by mouth every 8 (eight) hours. Patient not taking: Reported on 09/11/2022 09/09/22   Sabas Sous, MD  calcium acetate (PHOSLO) 667 MG capsule Take 3 capsules (2,001 mg total) by mouth 3 (three) times daily. Patient not taking: Reported on 09/11/2022 05/28/22   Leroy Sea, MD  carvedilol (COREG) 6.25 MG tablet Take 1 tablet (6.25 mg total) by mouth 2 (two) times daily with a meal. Patient not taking: Reported on 09/11/2022 05/28/22   Leroy Sea, MD  doxycycline (VIBRA-TABS) 100 MG tablet Take 1 tablet (100 mg total) by mouth 2 (two) times daily for 7 days. Patient not taking: Reported on 09/11/2022 09/09/22 09/16/22  Sabas Sous, MD  Fingerstix Lancets MISC Use as directed up to 4 times daily. Patient not taking: Reported on 09/11/2022 05/28/22   Leroy Sea, MD  glucose blood test strip Use to check fasting blood sugar once daily. 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 Patient not taking: Reported on 09/11/2022 05/28/22   Leroy Sea, MD  insulin detemir (LEVEMIR) 100 UNIT/ML injection Inject 0.35 mLs (35 Units total) into the skin daily. max daily dose 100 units per day Patient not taking: Reported on 09/11/2022 06/18/22     insulin glargine (LANTUS SOLOSTAR) 100  UNIT/ML Solostar Pen Inject 35 Units into the skin daily.Not to exceed 100 units per day. Patient not taking: Reported on 09/11/2022 06/19/22     Insulin Pen Needle 32G X 4 MM MISC Use with insulin pens Patient not taking: Reported on 09/11/2022 05/28/22   Leroy Sea, MD  Insulin Syringe-Needle U-100 25G X 1" 1 ML MISC For 4 times a day insulin SQ, 1 month supply. Diagnosis E11.65 Patient not taking: Reported on 09/11/2022 05/28/22   Leroy Sea, MD  isosorbide-hydrALAZINE (BIDIL) 20-37.5 MG tablet Take 1 tablet by mouth 3 (three) times daily. Patient not taking: Reported on 09/11/2022 07/20/22     levofloxacin (LEVAQUIN) 500 MG tablet Take 1 tablet (500 mg total) by mouth daily for 5 days. Patient not taking: Reported on 09/11/2022 09/08/22 09/14/22  Alba Cory, MD  oxyCODONE-acetaminophen (  PERCOCET/ROXICET) 5-325 MG tablet Take 1 tablet by mouth every 12 (twelve) hours as needed for severe pain. Patient not taking: Reported on 09/11/2022 07/04/22   Derwood Kaplan, MD  PROTONIX 40 MG tablet Take 1 tablet (40 mg total) by mouth daily. Patient not taking: Reported on 09/11/2022 05/25/22   Leroy Sea, MD  sevelamer carbonate (RENVELA) 800 MG tablet Take 2 tablets (1,600 mg total) by mouth 3 (three) times daily with meals. May also take 1 tablet (800 mg total) 2 (two) times daily as needed with snacks. Patient not taking: Reported on 09/11/2022 05/28/22   Leroy Sea, MD  gabapentin (NEURONTIN) 300 MG capsule Take 1 capsule (300 mg total) by mouth 3 (three) times daily. Patient not taking: Reported on 07/27/2019 07/29/18 08/30/19  Kallie Locks, FNP  sildenafil (VIAGRA) 25 MG tablet Take 1 tablet (25 mg total) by mouth as needed for erectile dysfunction. for erectile dysfunction. Patient not taking: Reported on 08/11/2020 10/13/19 11/27/20  Kallie Locks, FNP      Results for orders placed or performed during the hospital encounter of 09/10/22 (from the past 48 hour(s))  Basic  metabolic panel     Status: Abnormal   Collection Time: 09/11/22 12:48 AM  Result Value Ref Range   Sodium 131 (L) 135 - 145 mmol/L   Potassium 4.2 3.5 - 5.1 mmol/L   Chloride 95 (L) 98 - 111 mmol/L   CO2 22 22 - 32 mmol/L   Glucose, Bld 129 (H) 70 - 99 mg/dL    Comment: Glucose reference range applies only to samples taken after fasting for at least 8 hours.   BUN 61 (H) 6 - 20 mg/dL   Creatinine, Ser 1.61 (H) 0.61 - 1.24 mg/dL   Calcium 7.3 (L) 8.9 - 10.3 mg/dL   GFR, Estimated 6 (L) >60 mL/min    Comment: (NOTE) Calculated using the CKD-EPI Creatinine Equation (2021)    Anion gap 14 5 - 15    Comment: Performed at [facility], [address] [place], [place] [address]  CBC with Differential     Status: Abnormal   Collection Time: 09/11/22 12:48 AM  Result Value Ref Range   WBC 6.0 4.0 - 10.5 K/uL   RBC 3.43 (L) 4.22 - 5.81 MIL/uL   Hemoglobin 9.5 (L) 13.0 - 17.0 g/dL   HCT 54.0 (L) 98.1 - 19.1 %   MCV 88.0 80.0 - 100.0 fL   MCH 27.7 26.0 - 34.0 pg   MCHC 31.5 30.0 - 36.0 g/dL   RDW 47.8 (H) 29.5 - 62.1 %   Platelets 303 150 - 400 K/uL   nRBC 0.0 0.0 - 0.2 %   Neutrophils Relative % 65 %   Neutro Abs 4.0 1.7 - 7.7 K/uL   Lymphocytes Relative 19 %   Lymphs Abs 1.1 0.7 - 4.0 K/uL   Monocytes Relative 14 %   Monocytes Absolute 0.8 0.1 - 1.0 K/uL   Eosinophils Relative 0 %   Eosinophils Absolute 0.0 0.0 - 0.5 K/uL   Basophils Relative 1 %   Basophils Absolute 0.0 0.0 - 0.1 K/uL   Immature Granulocytes 1 %   Abs Immature Granulocytes 0.03 0.00 - 0.07 K/uL    Comment: Performed at [facility], [address] [place], [place] [address]  Magnesium     Status: None   Collection Time: 09/11/22 12:48 AM  Result Value Ref Range   Magnesium 2.0 1.7 - 2.4 mg/dL  Comment: Performed at [facility], [address] [place], [place] [address]  I-stat chem 8, ED     Status: Abnormal   Collection Time: 09/11/22  1:01 AM  Result Value Ref Range   Sodium 129  (L) 135 - 145 mmol/L   Potassium 4.3 3.5 - 5.1 mmol/L   Chloride 100 98 - 111 mmol/L   BUN 60 (H) 6 - 20 mg/dL   Creatinine, Ser 82.95 (H) 0.61 - 1.24 mg/dL   Glucose, Bld 621 (H) 70 - 99 mg/dL    Comment: Glucose reference range applies only to samples taken after fasting for at least 8 hours.   Calcium, Ion 0.75 (LL) 1.15 - 1.40 mmol/L   TCO2 22 22 - 32 mmol/L   Hemoglobin 11.2 (L) 13.0 - 17.0 g/dL   HCT 30.8 (L) 65.7 - 84.6 %   Comment NOTIFIED PHYSICIAN   CBG monitoring, ED     Status: Abnormal   Collection Time: 09/11/22  3:35 AM  Result Value Ref Range   Glucose-Capillary 150 (H) 70 - 99 mg/dL    Comment: Glucose reference range applies only to samples taken after fasting for at least 8 hours.  CBG monitoring, ED     Status: Abnormal   Collection Time: 09/11/22  6:08 AM  Result Value Ref Range   Glucose-Capillary 37 (LL) 70 - 99 mg/dL    Comment: Glucose reference range applies only to samples taken after fasting for at least 8 hours.  Glucose, capillary     Status: None   Collection Time: 09/11/22  9:19 AM  Result Value Ref Range   Glucose-Capillary 83 70 - 99 mg/dL    Comment: Glucose reference range applies only to samples taken after fasting for at least 8 hours.  Glucose, capillary     Status: None   Collection Time: 09/11/22  1:55 PM  Result Value Ref Range   Glucose-Capillary 88 70 - 99 mg/dL    Comment: Glucose reference range applies only to samples taken after fasting for at least 8 hours.  Glucose, capillary     Status: Abnormal   Collection Time: 09/11/22  5:03 PM  Result Value Ref Range   Glucose-Capillary 108 (H) 70 - 99 mg/dL    Comment: Glucose reference range applies only to samples taken after fasting for at least 8 hours.  Glucose, capillary     Status: None   Collection Time: 09/11/22  5:22 PM  Result Value Ref Range   Glucose-Capillary 96 70 - 99 mg/dL    Comment: Glucose reference range applies only to samples taken after fasting for at least 8  hours.  Glucose, capillary     Status: None   Collection Time: 09/11/22  7:36 PM  Result Value Ref Range   Glucose-Capillary 84 70 - 99 mg/dL    Comment: Glucose reference range applies only to samples taken after fasting for at least 8 hours.  Glucose, capillary     Status: Abnormal   Collection Time: 09/12/22 12:11 AM  Result Value Ref Range   Glucose-Capillary 153 (H) 70 - 99 mg/dL    Comment: Glucose reference range applies only to samples taken after fasting for at least 8 hours.  Glucose, capillary     Status: Abnormal   Collection Time: 09/12/22  4:07 AM  Result Value Ref Range   Glucose-Capillary 103 (H) 70 - 99 mg/dL    Comment: Glucose reference range applies only to samples taken after fasting for at least  8 hours.  Glucose, capillary     Status: Abnormal   Collection Time: 09/12/22  8:20 AM  Result Value Ref Range   Glucose-Capillary 143 (H) 70 - 99 mg/dL    Comment: Glucose reference range applies only to samples taken after fasting for at least 8 hours.  .  ROS: See HPI   Physical Exam: Vitals:   09/12/22 0431 09/12/22 0800  BP: (!) 90/59 110/76  Pulse: 66 73  Resp: 17   Temp: 98.2 F (36.8 C)   SpO2: 95% 100%     General: Adult chronically ill appearing male NAD HEENT: [place], EOMI, nonicteric, MMM Neck: JVD, Heart: RRR no MRG Lungs: CTA bilaterally nonlabored breathing room air Abdomen: NABS, soft, NTND no ascites appreciated Extremities: 2+ bipedal edema Skin: No overt rash, lower extremities with excoriations from itching no overt ulcers appreciated Neuro: Local from sleep and appears oriented no asterixis no acute focal deficits appreciated Dialysis Access: Right forearm AVG no bruit  Dialysis Orders: Center: [facility], 4 hs, TTS, EDW 61.5 kg , 2K, 2.5 Ca, heparin 2000, Mircera 100 every 2 weeks last given 4/19, Venofer 100 last dose 4/25, cal. 0.25 mcg Access right forearm AV GG   Assessment/Plan Clotted  R FA AVGG-consulting IR for declot, unfortunately  yesterday decompensation after sedation, could be related to uremia missed dialysis and  volume, request temporary catheter today and needs dialysis today.,  Declot graft versus PermCath placement at another time after hemodialysis ESRD -HD TTS, last dialysis 4/19,(last week missed HD 4/16,18) BUN 60, CR 11.4, K4.3= on 09/11/2022, check labs today, needs dialysis today via temporary catheter and again tomorrow secondary to missed dialysis Hypertension/volume  -BP up on admission , down this  a.m. 90/59, 110/76 /, hold amlodipine, carvedilol and Imdur until BP up volume overload by exam Anemia  -Hgb 11.2, no ESA needed currently follow-up trend Metabolic bone disease -  corrected calcium okay phosphorus 3.4 continue Renvela binder vitamin D on dialysis  Lenny Pastel, PA-C [facility] Beeper [phone] 09/12/2022, 10:56 AM   Patient seen and examined, agree with above note with above modifications.  Terribly noncompliant dialysis patient due to social situation.  He now has a clotted graft.  Unfortunately, circumstances have been such that he has not received dialysis in quite some time.  Yesterday, declot of AV graft was attempted but patient had respiratory failure upon using sedation.  Remarkably, he looks pretty good today.  We are going to be in discussions with IR to get at least something done, possibly a temp cath so we can get some dialysis before he tries to undergo another procedure Annie Sable, MD 09/12/2022

## 2022-09-12 NOTE — Progress Notes (Signed)
PROGRESS NOTE    RIDER ERMIS  ZOX:[id number] DOB: 06-13-1970 DOA: 09/10/2022 PCP: [name]   Brief Narrative:  This is a 52 year old male with past medical history of ESRD, HTN, HLD, homelessness, and anxiety, presented with AV fistula malfunction.  He was at hemodialysis and was unable to be dialyzed secondary to his fistula malfunction.  They have been contacting nephrology who recommended admission.    Assessment & Plan:   Principal Problem:   Hemodialysis AV fistula thrombosis, initial encounter Active Problems:   End-stage renal disease on hemodialysis   Homelessness   Essential hypertension   DM2 (diabetes mellitus, type 2)   Hypocalcemia   Anxiety disorder, unspecified   AV fistula occlusion  AV fistula likely thrombosis/malfunction/ESRD on HD: IR on board.  They attempted to declot this yesterday but after receiving small dose of Versed, reportedly, patient became hypoxic to the point that he was almost coding, he was given Narcan and revived.  He was hypertensive and then became normalized.  The procedure was aborted and he was sent back to the floor.  Waiting for IR to clarify plans.  Nephrology on board, plan for dialysis today as well as tomorrow.  Type 2 diabetes mellitus: Patient on 15 units of Semglee and SSI, blood sugar controlled.  Essential hypertension:  home medications including amlodipine, Imdur, Coreg have been resumed, he refused them yesterday but he has taken them today so his blood pressure is improved.   DVT prophylaxis: heparin injection 5,000 Units Start: 09/11/22 0600   Code Status: Full Code  Family Communication:  None present at bedside.    Status is: Inpatient Remains inpatient appropriate because: Needs dialysis today and declotting of the fistula, waiting for IR to schedule that.     Estimated body mass index is 25.21 kg/m as calculated from the following:   Height as of this encounter:  (1.702 m).   Weight as of this  encounter: 73 kg.    Nutritional Assessment: Body mass index is 25.21 kg/m.Marland Kitchen Seen by dietician.  I agree with the assessment and plan as outlined below: Nutrition Status:        . Skin Assessment: I have examined the patient's skin and I agree with the wound assessment as performed by the wound care RN as outlined below:    Consultants:  IR and nephrology  Procedures:  None  Antimicrobials:  Anti-infectives (From admission, onward)    None         Subjective: Patient seen and examined.  Yet again, as usual, he wanted to sleep and did not want to talk much.  Appeared comfortable.  Objective: Vitals:   09/11/22 1725 09/11/22 2123 09/12/22 0431 09/12/22 0800  BP: (!) 160/100 (!) 146/89 (!) 90/59 110/76  Pulse: 74 67 66 73  Resp: Temp: 98.1 F (36.7 C) 98 F (36.7 C) 98.2 F (36.8 C)   TempSrc: Oral Oral Oral   SpO2: 92% 98% 95% 100%  Weight:      Height:        Intake/Output Summary (Last 24 hours) at 09/12/2022 1309 Last data filed at 09/11/2022 2100 Gross per 24 hour  Intake 480 ml  Output 0 ml  Net 480 ml   Filed Weights   09/11/22 0010  Weight: 73 kg    Examination:  General exam: Appears calm and comfortable  Respiratory system: Clear to auscultation. Respiratory effort normal. Cardiovascular system: S1 & S2 heard, RRR. No JVD, murmurs, rubs, gallops  or clicks. No pedal edema. Gastrointestinal system: Abdomen is nondistended, soft and nontender. No organomegaly or masses felt. Normal bowel sounds heard. Central nervous system: Sleepy, not want to answer any questions. Extremities: Symmetric 5 x 5 power. Skin: No rashes, lesions or ulcers.   Data Reviewed: I have personally reviewed following labs and imaging studies  CBC: Recent Labs  Lab 09/05/22 2245 09/06/22 0010 09/07/22 0010 09/08/22 0340 09/09/22 1910 09/11/22 0048 09/11/22 0101  WBC 6.0  --  8.6 7.4 5.4 6.0  --   NEUTROABS 3.8  --  6.9 5.9  --  4.0  --   HGB  10.3*   < > 9.5* 9.3* 10.3* 9.5* 11.2*  HCT 34.0*   < > 29.5* 29.3* 33.0* 30.2* 33.0*  MCV 92.1  --  86.8 86.9 87.8 88.0  --   PLT 348  --  345 322 318 303  --    < > = values in this interval not displayed.    Basic Metabolic Panel: Recent Labs  Lab 09/06/22 0016 09/07/22 0010 09/08/22 0340 09/09/22 1910 09/11/22 0048 09/11/22 0101  NA 134* 135 132* 134* 131* 129*  K 4.7 3.9 3.9 4.2 4.2 4.3  CL 101 98 95* 96* 95* 100  CO2 19* --   GLUCOSE 124* 188* 216* 179* 129* 127*  BUN 88* 46* 33* 47* 61* 60*  CREATININE 11.11* 7.30* 5.90* 7.89* 9.43* 11.40*  CALCIUM 7.4* 7.9* 7.9* 8.0* 7.3*  --   MG  --   --  2.0  --  2.0  --   PHOS  --   --  3.6  --   --   --     GFR: Estimated Creatinine Clearance: 7.3 mL/min (A) (by C-G formula based on SCr of 11.4 mg/dL (H)). Liver Function Tests: Recent Labs  Lab 09/06/22 0016 09/07/22 0010 09/08/22 0340  AST 56* 44* 73*  ALT 47* 42 54*  ALKPHOS 475* 382* 341*  BILITOT 0.7 0.9 0.8  PROT 7.6 7.4 7.0  ALBUMIN 3.2* 3.1* 2.8*    No results for input(s): "LIPASE", "AMYLASE" in the last 168 hours. No results for input(s): "AMMONIA" in the last 168 hours. Coagulation Profile: Recent Labs  Lab 09/07/22 0010 09/08/22 0340  INR 1.3* 1.3*    Cardiac Enzymes: No results for input(s): "CKTOTAL", "CKMB", "CKMBINDEX", "TROPONINI" in the last 168 hours. BNP (last 3 results) No results for input(s): "PROBNP" in the last 8760 hours. HbA1C: No results for input(s): "HGBA1C" in the last 72 hours. CBG: Recent Labs  Lab 09/11/22 1936 09/12/22 0011 09/12/22 0407 09/12/22 0820 09/12/22 1151  GLUCAP 84 153* 103* 143* 105*    Lipid Profile: No results for input(s): "CHOL", "HDL", "LDLCALC", "TRIG", "CHOLHDL", "LDLDIRECT" in the last 72 hours. Thyroid Function Tests: No results for input(s): "TSH", "T4TOTAL", "FREET4", "T3FREE", "THYROIDAB" in the last 72 hours. Anemia Panel: No results for input(s): "VITAMINB12", "FOLATE",  "FERRITIN", "TIBC", "IRON", "RETICCTPCT" in the last 72 hours. Sepsis Labs: Recent Labs  Lab 09/07/22 0010 09/07/22 0718 09/08/22 0340  PROCALCITON  --   --  1.55  LATICACIDVEN 1.1 1.3  --      Recent Results (from the past 240 hour(s))  Blood Culture (routine x 2)     Status: None   Collection Time: 09/07/22 12:00 AM   Specimen: BLOOD LEFT ARM  Result Value Ref Range Status   Specimen Description BLOOD LEFT ARM  Final   Special Requests   Final  BOTTLES DRAWN AEROBIC AND ANAEROBIC Blood Culture adequate volume   Culture   Final    NO GROWTH 5 DAYS Performed at [facility], [address], [place], [place] [address]    Report Status 09/12/2022 FINAL  Final  Blood Culture (routine x 2)     Status: None   Collection Time: 09/07/22 12:17 AM   Specimen: BLOOD LEFT HAND  Result Value Ref Range Status   Specimen Description BLOOD LEFT HAND  Final   Special Requests   Final    BOTTLES DRAWN AEROBIC ONLY Blood Culture adequate volume   Culture   Final    NO GROWTH 5 DAYS Performed at [facility], [address], [place], [place] [address]    Report Status 09/12/2022 FINAL  Final     Radiology Studies: IR THROMBECTOMY AV FISTULA W/THROMBOLYSIS INC/SHUNT/IMG RIGHT  Result Date: 09/12/2022 INDICATION: 52 year old male presents for thrombectomy of occluded right upper extremity dialysis circuit EXAM: ULTRASOUND-GUIDED ACCESS RIGHT UPPER EXTREMITY DIALYSIS CIRCUIT INTENTION FOR THROMBECTOMY OF DIALYSIS CIRCUIT DISCONTINUATION OF PROCEDURE MEDICATIONS: None. ANESTHESIA/SEDATION: Moderate (conscious) sedation was employed during this procedure. A total of Versed 1.0 mg and Fentanyl 50 mcg was administered intravenously by the radiology nurse. Total intra-service moderate Sedation Time: 10 minutes. The patient's level of consciousness and vital signs were monitored continuously by radiology nursing throughout the procedure under my direct supervision. FLUOROSCOPY: Radiation  Exposure Index (as provided by the fluoroscopic device): 0 mGy Kerma COMPLICATIONS: SIR category C PROCEDURE: The procedure, risks, benefits, and alternatives were explained to the patient and the patient's family, including bleeding, infection, arterial thrombus, venous thrombus, vessel injury, need for further procedure, need for stenting, contrast reaction, need for catheter placement, cardiopulmonary collapse, death. Questions regarding the procedure were encouraged and answered. The patient understands and consents to the procedure. Ultrasound survey was performed with images stored and sent to PACs. The right upper extremity was prepped and draped in the usual sterile fashion. Moderate sedation was initiated Ultrasound guidance was used to access the upper extremity graft with a micropuncture kit. At this point during the procedure, the patient became unresponsive and respiratory distress was treated with airway management and narcan reversal of the fentanyl. Once the patient was arousable and vital signs were stabilized, we withdrew from the case. No significant blood loss was encountered. IMPRESSION: Attempt at right upper extremity dialysis circuit thrombectomy was aborted secondary to respiratory distress. ACCESS: The team will need to make arrangements for treatment after interval care. Electronically Signed   By: Gilmer Mor D.O.   On: 09/12/2022 08:50   IR US Guide Vasc Access Right  Result Date: 09/12/2022 INDICATION: 52 year old male presents for thrombectomy of occluded right upper extremity dialysis circuit EXAM: ULTRASOUND-GUIDED ACCESS RIGHT UPPER EXTREMITY DIALYSIS CIRCUIT INTENTION FOR THROMBECTOMY OF DIALYSIS CIRCUIT DISCONTINUATION OF PROCEDURE MEDICATIONS: None. ANESTHESIA/SEDATION: Moderate (conscious) sedation was employed during this procedure. A total of Versed 1.0 mg and Fentanyl 50 mcg was administered intravenously by the radiology nurse. Total intra-service moderate Sedation  Time: 10 minutes. The patient's level of consciousness and vital signs were monitored continuously by radiology nursing throughout the procedure under my direct supervision. FLUOROSCOPY: Radiation Exposure Index (as provided by the fluoroscopic device): 0 mGy Kerma COMPLICATIONS: SIR category C PROCEDURE: The procedure, risks, benefits, and alternatives were explained to the patient and the patient's family, including bleeding, infection, arterial thrombus, venous thrombus, vessel injury, need for further procedure, need for stenting, contrast reaction, need for catheter placement, cardiopulmonary collapse,  death. Questions regarding the procedure were encouraged and answered. The patient understands and consents to the procedure. Ultrasound survey was performed with images stored and sent to PACs. The right upper extremity was prepped and draped in the usual sterile fashion. Moderate sedation was initiated Ultrasound guidance was used to access the upper extremity graft with a micropuncture kit. At this point during the procedure, the patient became unresponsive and respiratory distress was treated with airway management and narcan reversal of the fentanyl. Once the patient was arousable and vital signs were stabilized, we withdrew from the case. No significant blood loss was encountered. IMPRESSION: Attempt at right upper extremity dialysis circuit thrombectomy was aborted secondary to respiratory distress. ACCESS: The team will need to make arrangements for treatment after interval care. Electronically Signed   By: Gilmer Mor D.O.   On: 09/12/2022 08:50   DG Chest Portable 1 View  Result Date: 09/11/2022 CLINICAL DATA:  Shortness of breath. EXAM: PORTABLE CHEST 1 VIEW COMPARISON:  September 09, 2022 FINDINGS: The cardiac silhouette is markedly enlarged and unchanged in size. Mildly increased perihilar interstitial lung markings are seen. This is increased in severity when compared to the prior study. The area  of right middle lobe consolidation seen on the prior study is no longer visualized. There is no evidence of a pleural effusion or pneumothorax. The visualized skeletal structures are unremarkable. IMPRESSION: Stable cardiomegaly with findings consistent with mild interstitial edema. Electronically Signed   By: Aram Candela M.D.   On: 09/11/2022 01:05    Scheduled Meds:  amLODipine  10 mg Oral Daily   aspirin EC  81 mg Oral Daily   calcium acetate  2,001 mg Oral TID with meals   carvedilol  6.25 mg Oral BID WC   heparin  5,000 Units Subcutaneous Q8H   insulin aspart  0-15 Units Subcutaneous Q4H   insulin glargine-yfgn  15 Units Subcutaneous Daily   isosorbide-hydrALAZINE  1 tablet Oral TID   pantoprazole  40 mg Oral Daily   sevelamer carbonate  800 mg Oral TID WC   Continuous Infusions:   LOS: 1 day   Hughie Closs, MD [facility]  09/12/2022, 1:09 PM   *Please note that this is a verbal dictation therefore any spelling or grammatical errors are due to the "Dragon Medical One" system interpretation.  Please page via Amion and do not message via secure chat for urgent patient care matters. Secure chat can be used for non urgent patient care matters.  How to contact the [facility] Attending or Consulting provider 7A - 7P or covering provider during after hours 7P -7A, for this patient?  Check the care team in [facility] and look for a) attending/consulting TRH provider listed and b) the [facility] team listed. Page or secure chat 7A-7P. Log into [email or web address] and use [facility] universal password to access. If you do not have the password, please contact the hospital operator. Locate the [facility] provider you are looking for under [facility] and page to a number that you can be directly reached. If you still have difficulty reaching the provider, please page the [facility] (Director on Call) for the Hospitalists listed on amion for assistance.

## 2022-09-13 DIAGNOSIS — T82868A Thrombosis of vascular prosthetic devices, implants and grafts, initial encounter: Secondary | ICD-10-CM | POA: Diagnosis not present

## 2022-09-13 LAB — HEPATITIS B SURFACE ANTIGEN: Hepatitis B Surface Ag: NONREACTIVE

## 2022-09-13 LAB — GLUCOSE, CAPILLARY
Glucose-Capillary: 118 mg/dL — ABNORMAL HIGH (ref 70–99)
Glucose-Capillary: 169 mg/dL — ABNORMAL HIGH (ref 70–99)
Glucose-Capillary: 189 mg/dL — ABNORMAL HIGH (ref 70–99)

## 2022-09-13 MED ORDER — CHLORHEXIDINE GLUCONATE CLOTH 2 % EX PADS
6.0000 | MEDICATED_PAD | Freq: Every day | CUTANEOUS | Status: DC
  Administered 2022-09-14: 6 via TOPICAL

## 2022-09-13 NOTE — Progress Notes (Signed)
Pt noncompliant with vitals/CBG checks. Pt refused bed alarm, got up and went into bathroom and took a shower. Pt came out of bathroom, refusing to wear clothing or gown. Sat on the edge of bed, naked, and began eating old dinner tray again. HD catheter dressing wet but intact. Explained importance of not getting dressing wet and infection risk. Pt asked "Am I I gonna leave today?". Informed pt he would have to speak with MD in am regarding plan of care and discharge. Patted dressing with dry washcloth to remove excess moisture.

## 2022-09-13 NOTE — Progress Notes (Signed)
Patient refused all medications including insulin, heparin sq, and isosorbide-hydralazine. Patient educated on medications including benefits and risks of not taking them. On call provider notified.

## 2022-09-13 NOTE — TOC Initial Note (Signed)
Transition of Care [facility]) - Initial/Assessment Note    Patient Details  Name: [name]: [id number] Date of Birth: Jul 14, 1970  Transition of [facility] CM/SW Contact:    Ralene Bathe, LCSWA Phone Number: 09/13/2022, 9:16 AM  Clinical Narrative:                 LCSW met with patient at bedside after being notified that patient is homeless to provide housing resources and receive information about how the patient will be transported at discharge.  LCSW called patient's name several times and patient did not respond.  LCSW stated name and role and asked if patient could hear LCSW.  Patient nodded yes, acknowledging that he could hear this Clinical research associate and closed eyes.  LCSW attempted to engage with patient, but patient would not respond or answer any questions.  Expected Discharge Plan: Homeless Shelter Barriers to Discharge: Continued Medical Work up   Patient Goals and CMS Choice            Expected Discharge Plan and Services In-house Referral: Clinical Social Work                                            Prior Living Arrangements/Services     Patient language and need for interpreter reviewed:: No        Need for Family Participation in Patient Care: Yes (Comment) Care giver support system in place?: No (comment)   Criminal Activity/Legal Involvement Pertinent to Current Situation/Hospitalization: No - Comment as needed  Activities of Daily Living      Permission Sought/Granted                  Emotional Assessment Appearance:: Appears older than stated age Attitude/Demeanor/Rapport: Avoidant Affect (typically observed): Withdrawn        Admission diagnosis:  Hemodialysis AV fistula thrombosis, initial encounter (HCC) [W09.811B] Malfunction of arteriovenous dialysis fistula, initial encounter (HCC) [T82.590A] AV fistula occlusion (HCC) [T82.898A] Patient Active Problem List   Diagnosis Date Noted   Hemodialysis AV fistula  thrombosis, initial encounter (HCC) 09/11/2022   AV fistula occlusion (HCC) 09/11/2022   CAP (community acquired pneumonia) 05/20/2022   Chronic combined systolic and diastolic congestive heart failure (HCC) 05/20/2022   Anxiety disorder, unspecified 03/26/2022   Hypocalcemia 03/25/2022   Iron deficiency anemia, unspecified 03/20/2022   CHF (congestive heart failure) (HCC) 11/15/2021   Nausea vomiting and diarrhea 11/07/2021   Homelessness 10/13/2021   Acute hypoxic respiratory failure (HCC) 10/13/2021   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) 09/28/2021   Aspiration pneumonia (HCC) 09/28/2021   Volume overload 04/12/2021   Mild protein-calorie malnutrition (HCC) 03/06/2021   Aftercare including intermittent dialysis (HCC) 02/27/2021   Allergy, unspecified, initial encounter 02/27/2021   History of anemia due to chronic kidney disease 02/27/2021   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   Secondary hyperparathyroidism of renal origin (HCC) 02/27/2021   Shortness of breath 02/27/2021   Type 2 diabetes mellitus with other diabetic kidney complication (HCC) 02/27/2021   Hypertensive urgency 02/18/2021   Type 2 diabetes mellitus (HCC) 02/17/2021   Acute on chronic combined systolic and diastolic CHF (congestive heart failure) (HCC) 11/29/2020   Hypertensive crisis 11/27/2020   Stage 4 chronic kidney disease (HCC) 02/22/2020   Left ventricular dysfunction 04/27/2019   Hemoglobin A1C greater than 9%, indicating poor diabetic control 07/29/2018  LBBB (left bundle branch block)    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   Hyperlipidemia LDL goal <70 11/24/2008   DM2 (diabetes mellitus, type 2) (HCC) 07/17/2006   T2DM (type 2 diabetes mellitus) (HCC) 07/17/2006   End-stage renal disease on hemodialysis (HCC) 07/17/2006   Essential hypertension 07/17/2006   PCP:  [name], [facility] Pharmacy:   [facility] [address] [place] [address] Phone: [phone] Fax: [phone]  [facility] [address] [place]), [place] - [address] [place] [address] Phone: [phone] Fax: [phone]     Social Determinants of Health (SDOH) Social History: SDOH Screenings   Food Insecurity: Food Insecurity Present (05/08/2022)  Housing: Medium Risk (05/08/2022)  Transportation Needs: Unmet Transportation Needs (05/08/2022)  Utilities: Not At Risk (05/08/2022)  Depression (PHQ2-9): Low Risk  (10/13/2019)  Financial Resource Strain: High Risk (05/08/2022)  Tobacco Use: Low Risk  (09/12/2022)   SDOH Interventions:     Readmission Risk Interventions    11/07/2021    4:21 PM 02/19/2021    3:52 PM  Readmission Risk Prevention Plan  Transportation Screening Complete Complete  Medication Review (RN Care Manager) Complete Complete  PCP or Specialist appointment within 3-5 days of discharge Complete Complete  HRI or Home Care Consult Complete Complete  SW Recovery Care/Counseling Consult Complete Complete  Palliative Care Screening Not Applicable Not Applicable  Skilled Nursing Facility Not Applicable Not Applicable

## 2022-09-13 NOTE — Plan of Care (Signed)
  Problem: Coping: Goal: Ability to adjust to condition or change in health will improve Outcome: Progressing   Problem: Education: Goal: Knowledge of General Education information will improve Description: Including pain rating scale, medication(s)/side effects and non-pharmacologic comfort measures Outcome: Progressing   Problem: Health Behavior/Discharge Planning: Goal: Ability to manage health-related needs will improve Outcome: Progressing   Problem: Coping: Goal: Level of anxiety will decrease Outcome: Progressing

## 2022-09-13 NOTE — Progress Notes (Signed)
PROGRESS NOTE    [name]  ZOX:[id number] DOB: 1971-02-11 DOA: 09/10/2022 PCP: [name]   Brief Narrative:  This is a 52 year old male with past medical history of ESRD, HTN, HLD, homelessness, and anxiety, presented with AV fistula malfunction.  He was at hemodialysis and was unable to be dialyzed secondary to his fistula malfunction.  They have been contacting nephrology who recommended admission.    Assessment & Plan:   Principal Problem:   Hemodialysis AV fistula thrombosis, initial encounter (HCC) Active Problems:   End-stage renal disease on hemodialysis (HCC)   Homelessness   Essential hypertension   DM2 (diabetes mellitus, type 2) (HCC)   Hypocalcemia   Anxiety disorder, unspecified   AV fistula occlusion (HCC)  AV fistula likely thrombosis/malfunction/ESRD on HD: IR on board.  They attempted to declot this for 2424 but after receiving small dose of Versed, reportedly, patient became hypoxic to the point that he was almost coding, he was given Narcan and revived.  He was hypertensive and then became normalized.  The procedure was aborted and he was sent back to the floor.  Patient eventually had temporary dialysis catheter and dialysis on 09/12/2022.  Per nephrology, next dialysis is tomorrow.  Discussed with IR and nephrology, per nephrology, he will need permanent tunneled dialysis catheter or declot of fistula before discharge, discussed with IR and they are planning to declot Monday.  Patient has been very noncompliant historically.  He has been refusing care, sometimes medications and other stuff here.  He has been making statements that he wants to leave.  IR has been informed that patient is very unstable and noncompliant and is at high risk of leaving AMA and I am not sure if he will be here by Monday.  They have been requested to expedite the process.  Type 2 diabetes mellitus: Patient on 15 units of Semglee and SSI, blood sugar controlled.  Essential  hypertension:  home medications including amlodipine, Imdur, Coreg have been resumed, blood pressure controlled.  DVT prophylaxis: heparin injection 5,000 Units Start: 09/11/22 0600   Code Status: Full Code  Family Communication:  None present at bedside.    Status is: Inpatient Remains inpatient appropriate because: Needs declotting of the fistula or permanent tunneled dialysis catheter before discharge.   Estimated body mass index is 25.17 kg/m as calculated from the following:   Height as of this encounter: 5\' 7"  (1.702 m).   Weight as of this encounter: 72.9 kg.    Nutritional Assessment: Body mass index is 25.17 kg/m.Marland Kitchen Seen by dietician.  I agree with the assessment and plan as outlined below: Nutrition Status:        . Skin Assessment: I have examined the patient's skin and I agree with the wound assessment as performed by the wound care RN as outlined below:    Consultants:  IR and nephrology  Procedures:  None  Antimicrobials:  Anti-infectives (From admission, onward)    None         Subjective: Patient seen and examined.  He has no complaints.  He was asking when he can be discharged.  Today was the first time that he was fully alert and oriented and was little more responsive to the questions and conversations.  Objective: Vitals:   09/12/22 2227 09/13/22 0000 09/13/22 0027 09/13/22 0535  BP: 123/73 111/69  (!) 105/94  Pulse: 65 68 67 68  Resp: 14 17  18   Temp:    98 F (36.7 C)  TempSrc:  Axillary  SpO2: 100%  99% 92%  Weight:      Height:        Intake/Output Summary (Last 24 hours) at 09/13/2022 1031 Last data filed at 09/13/2022 0800 Gross per 24 hour  Intake 300 ml  Output 3200 ml  Net -2900 ml    Filed Weights   09/11/22 0010 09/12/22 2058  Weight: 73 kg 72.9 kg    Examination: General exam: Appears calm and comfortable  Respiratory system: Clear to auscultation. Respiratory effort normal. Cardiovascular system: S1 & S2  heard, RRR. No JVD, murmurs, rubs, gallops or clicks. No pedal edema. Gastrointestinal system: Abdomen is nondistended, soft and nontender. No organomegaly or masses felt. Normal bowel sounds heard. Central nervous system: Alert and oriented. No focal neurological deficits. Extremities: Symmetric 5 x 5 power. Skin: No rashes, lesions or ulcers.  Psychiatry: Judgement and insight appear poor.  Data Reviewed: I have personally reviewed following labs and imaging studies  CBC: Recent Labs  Lab 09/07/22 0010 09/08/22 0340 09/09/22 1910 09/11/22 0048 09/11/22 0101  WBC 8.6 7.4 5.4 6.0  --   NEUTROABS 6.9 5.9  --  4.0  --   HGB 9.5* 9.3* 10.3* 9.5* 11.2*  HCT 29.5* 29.3* 33.0* 30.2* 33.0*  MCV 86.8 86.9 87.8 88.0  --   PLT 345 322 318 303  --     Basic Metabolic Panel: Recent Labs  Lab 09/07/22 0010 09/08/22 0340 09/09/22 1910 09/11/22 0048 09/11/22 0101  NA 135 132* 134* 131* 129*  K 3.9 3.9 4.2 4.2 4.3  CL 98 95* 96* 95* 100  CO2 23 24 22 22   --   GLUCOSE 188* 216* 179* 129* 127*  BUN 46* 33* 47* 61* 60*  CREATININE 7.30* 5.90* 7.89* 9.43* 11.40*  CALCIUM 7.9* 7.9* 8.0* 7.3*  --   MG  --  2.0  --  2.0  --   PHOS  --  3.6  --   --   --     GFR: Estimated Creatinine Clearance: 7.3 mL/min (A) (by C-G formula based on SCr of 11.4 mg/dL (H)). Liver Function Tests: Recent Labs  Lab 09/07/22 0010 09/08/22 0340  AST 44* 73*  ALT 42 54*  ALKPHOS 382* 341*  BILITOT 0.9 0.8  PROT 7.4 7.0  ALBUMIN 3.1* 2.8*    No results for input(s): "LIPASE", "AMYLASE" in the last 168 hours. No results for input(s): "AMMONIA" in the last 168 hours. Coagulation Profile: Recent Labs  Lab 09/07/22 0010 09/08/22 0340  INR 1.3* 1.3*    Cardiac Enzymes: No results for input(s): "CKTOTAL", "CKMB", "CKMBINDEX", "TROPONINI" in the last 168 hours. BNP (last 3 results) No results for input(s): "PROBNP" in the last 8760 hours. HbA1C: No results for input(s): "HGBA1C" in the last 72  hours. CBG: Recent Labs  Lab 09/12/22 0011 09/12/22 0407 09/12/22 0820 09/12/22 1151 09/12/22 1620  GLUCAP 153* 103* 143* 105* 163*    Lipid Profile: No results for input(s): "CHOL", "HDL", "LDLCALC", "TRIG", "CHOLHDL", "LDLDIRECT" in the last 72 hours. Thyroid Function Tests: No results for input(s): "TSH", "T4TOTAL", "FREET4", "T3FREE", "THYROIDAB" in the last 72 hours. Anemia Panel: No results for input(s): "VITAMINB12", "FOLATE", "FERRITIN", "TIBC", "IRON", "RETICCTPCT" in the last 72 hours. Sepsis Labs: Recent Labs  Lab 09/07/22 0010 09/07/22 0718 09/08/22 0340  PROCALCITON  --   --  1.55  LATICACIDVEN 1.1 1.3  --      Recent Results (from the past 240 hour(s))  Blood Culture (routine x 2)  Status: None   Collection Time: 09/07/22 12:00 AM   Specimen: BLOOD LEFT ARM  Result Value Ref Range Status   Specimen Description BLOOD LEFT ARM  Final   Special Requests   Final    BOTTLES DRAWN AEROBIC AND ANAEROBIC Blood Culture adequate volume   Culture   Final    NO GROWTH 5 DAYS Performed at [facility], [address] [place], [place] [address]    Report Status 09/12/2022 FINAL  Final  Blood Culture (routine x 2)     Status: None   Collection Time: 09/07/22 12:17 AM   Specimen: BLOOD LEFT HAND  Result Value Ref Range Status   Specimen Description BLOOD LEFT HAND  Final   Special Requests   Final    BOTTLES DRAWN AEROBIC ONLY Blood Culture adequate volume   Culture   Final    NO GROWTH 5 DAYS Performed at [facility], [address], [place], [place] [address]    Report Status 09/12/2022 FINAL  Final     Radiology Studies: IR Fluoro Guide CV Line Right  Result Date: 09/12/2022 INDICATION: Need for dialysis access EXAM: Placement of temporary hemodialysis catheter using ultrasound and fluoroscopic guidance MEDICATIONS: None ANESTHESIA/SEDATION: Local analgesia FLUOROSCOPY TIME:  Fluoroscopy Time: 0.1 minutes (1 mGy) COMPLICATIONS: None  immediate. PROCEDURE: Informed written consent was obtained from the patient after a thorough discussion of the procedural risks, benefits and alternatives. All questions were addressed. Maximal Sterile Barrier Technique was utilized including caps, mask, sterile gowns, sterile gloves, sterile drape, hand hygiene and skin antiseptic. A timeout was performed prior to the initiation of the procedure. The patient was placed supine on the exam table. The right neck and chest was prepped and draped in the standard sterile fashion. Ultrasound was used to evaluate the right internal jugular vein, which was found to be widely patent. A permanent image was stored in the electronic medical record. Using ultrasound guidance, the right internal jugular vein was directly punctured using a 21 gauge micropuncture set. Access site was serially dilated to accommodate an 035 wire, which was advanced into the IVC. Subsequently, serial tract dilation was performed and a 13 French temporary triple-lumen hemodialysis catheter was advanced into the central veins, such that the tip overlies the superior cavoatrial junction. The catheter was found to aspirate and flush appropriately. It was locked with the appropriate volume of heparinized saline. It was secured to the skin using silk suture and a sterile dressing. The patient tolerated the procedure well without immediate complication. IMPRESSION: Successful placement of a temporary triple-lumen hemodialysis catheter via the right internal jugular vein. The line is ready for immediate use. Electronically Signed   By: Olive Bass M.D.   On: 09/12/2022 15:27   IR US Guide Vasc Access Right  Result Date: 09/12/2022 INDICATION: Need for dialysis access EXAM: Placement of temporary hemodialysis catheter using ultrasound and fluoroscopic guidance MEDICATIONS: None ANESTHESIA/SEDATION: Local analgesia FLUOROSCOPY TIME:  Fluoroscopy Time: 0.1 minutes (1 mGy) COMPLICATIONS: None immediate.  PROCEDURE: Informed written consent was obtained from the patient after a thorough discussion of the procedural risks, benefits and alternatives. All questions were addressed. Maximal Sterile Barrier Technique was utilized including caps, mask, sterile gowns, sterile gloves, sterile drape, hand hygiene and skin antiseptic. A timeout was performed prior to the initiation of the procedure. The patient was placed supine on the exam table. The right neck and chest was prepped and draped in the standard sterile fashion. Ultrasound was used to evaluate  the right internal jugular vein, which was found to be widely patent. A permanent image was stored in the electronic medical record. Using ultrasound guidance, the right internal jugular vein was directly punctured using a 21 gauge micropuncture set. Access site was serially dilated to accommodate an 035 wire, which was advanced into the IVC. Subsequently, serial tract dilation was performed and a 13 French temporary triple-lumen hemodialysis catheter was advanced into the central veins, such that the tip overlies the superior cavoatrial junction. The catheter was found to aspirate and flush appropriately. It was locked with the appropriate volume of heparinized saline. It was secured to the skin using silk suture and a sterile dressing. The patient tolerated the procedure well without immediate complication. IMPRESSION: Successful placement of a temporary triple-lumen hemodialysis catheter via the right internal jugular vein. The line is ready for immediate use. Electronically Signed   By: Olive Bass M.D.   On: 09/12/2022 15:27   IR THROMBECTOMY AV FISTULA W/THROMBOLYSIS INC/SHUNT/IMG RIGHT  Result Date: 09/12/2022 INDICATION: 52 year old male presents for thrombectomy of occluded right upper extremity dialysis circuit EXAM: ULTRASOUND-GUIDED ACCESS RIGHT UPPER EXTREMITY DIALYSIS CIRCUIT INTENTION FOR THROMBECTOMY OF DIALYSIS CIRCUIT DISCONTINUATION OF PROCEDURE  MEDICATIONS: None. ANESTHESIA/SEDATION: Moderate (conscious) sedation was employed during this procedure. A total of Versed 1.0 mg and Fentanyl 50 mcg was administered intravenously by the radiology nurse. Total intra-service moderate Sedation Time: 10 minutes. The patient's level of consciousness and vital signs were monitored continuously by radiology nursing throughout the procedure under my direct supervision. FLUOROSCOPY: Radiation Exposure Index (as provided by the fluoroscopic device): 0 mGy Kerma COMPLICATIONS: SIR category C PROCEDURE: The procedure, risks, benefits, and alternatives were explained to the patient and the patient's family, including bleeding, infection, arterial thrombus, venous thrombus, vessel injury, need for further procedure, need for stenting, contrast reaction, need for catheter placement, cardiopulmonary collapse, death. Questions regarding the procedure were encouraged and answered. The patient understands and consents to the procedure. Ultrasound survey was performed with images stored and sent to PACs. The right upper extremity was prepped and draped in the usual sterile fashion. Moderate sedation was initiated Ultrasound guidance was used to access the upper extremity graft with a micropuncture kit. At this point during the procedure, the patient became unresponsive and respiratory distress was treated with airway management and narcan reversal of the fentanyl. Once the patient was arousable and vital signs were stabilized, we withdrew from the case. No significant blood loss was encountered. IMPRESSION: Attempt at right upper extremity dialysis circuit thrombectomy was aborted secondary to respiratory distress. ACCESS: The team will need to make arrangements for treatment after interval care. Electronically Signed   By: Gilmer Mor D.O.   On: 09/12/2022 08:50   IR US Guide Vasc Access Right  Result Date: 09/12/2022 INDICATION: 52 year old male presents for thrombectomy of  occluded right upper extremity dialysis circuit EXAM: ULTRASOUND-GUIDED ACCESS RIGHT UPPER EXTREMITY DIALYSIS CIRCUIT INTENTION FOR THROMBECTOMY OF DIALYSIS CIRCUIT DISCONTINUATION OF PROCEDURE MEDICATIONS: None. ANESTHESIA/SEDATION: Moderate (conscious) sedation was employed during this procedure. A total of Versed 1.0 mg and Fentanyl 50 mcg was administered intravenously by the radiology nurse. Total intra-service moderate Sedation Time: 10 minutes. The patient's level of consciousness and vital signs were monitored continuously by radiology nursing throughout the procedure under my direct supervision. FLUOROSCOPY: Radiation Exposure Index (as provided by the fluoroscopic device): 0 mGy Kerma COMPLICATIONS: SIR category C PROCEDURE: The procedure, risks, benefits, and alternatives were explained to the patient and the patient's family, including bleeding, infection, arterial  thrombus, venous thrombus, vessel injury, need for further procedure, need for stenting, contrast reaction, need for catheter placement, cardiopulmonary collapse, death. Questions regarding the procedure were encouraged and answered. The patient understands and consents to the procedure. Ultrasound survey was performed with images stored and sent to PACs. The right upper extremity was prepped and draped in the usual sterile fashion. Moderate sedation was initiated Ultrasound guidance was used to access the upper extremity graft with a micropuncture kit. At this point during the procedure, the patient became unresponsive and respiratory distress was treated with airway management and narcan reversal of the fentanyl. Once the patient was arousable and vital signs were stabilized, we withdrew from the case. No significant blood loss was encountered. IMPRESSION: Attempt at right upper extremity dialysis circuit thrombectomy was aborted secondary to respiratory distress. ACCESS: The team will need to make arrangements for treatment after interval  care. Electronically Signed   By: Gilmer Mor D.O.   On: 09/12/2022 08:50    Scheduled Meds:  amLODipine  10 mg Oral Daily   aspirin EC  81 mg Oral Daily   calcium acetate  2,001 mg Oral TID with meals   carvedilol  6.25 mg Oral BID WC   Chlorhexidine Gluconate Cloth  6 each Topical Daily   heparin  5,000 Units Subcutaneous Q8H   insulin aspart  0-15 Units Subcutaneous Q4H   insulin glargine-yfgn  15 Units Subcutaneous Daily   isosorbide-hydrALAZINE  1 tablet Oral TID   pantoprazole  40 mg Oral Daily   sevelamer carbonate  800 mg Oral TID WC   Continuous Infusions:   LOS: 2 days   Hughie Closs, MD [facility]  09/13/2022, 10:31 AM   *Please note that this is a verbal dictation therefore any spelling or grammatical errors are due to the "[employer] One" system interpretation.  Please page via Amion and do not message via secure chat for urgent patient care matters. Secure chat can be used for non urgent patient care matters.  How to contact the [facility] Attending or Consulting provider 7A - 7P or covering provider during after hours 7P -7A, for this patient?  Check the care team in [facility] and look for a) attending/consulting [facility] provider listed and b) the [facility] team listed. Page or secure chat 7A-7P. Log into [email or web address] and use [place]'s universal password to access. If you do not have the password, please contact the hospital operator. Locate the [facility] provider you are looking for under [facility] and page to a number that you can be directly reached. If you still have difficulty reaching the provider, please page the [facility] (Director on Call) for the Hospitalists listed on amion for assistance.

## 2022-09-13 NOTE — Progress Notes (Addendum)
[facility] Progress Note   Subjective:   Patient seen and examined at bedside.  Sleeping.  Will respond to repeat verbal stimuli but refuses to open his eyes and fully engage.  States "Im tired, I didn't sleep good."  Objective Vitals:   09/12/22 2227 09/13/22 0000 09/13/22 0027 09/13/22 0535  BP: 123/73 111/69  (!) 105/94  Pulse: 65 68 67 68  Resp: 14 17  18   Temp:    98 F (36.7 C)  TempSrc:    Axillary  SpO2: 100%  99% 92%  Weight:      Height:       Physical Exam General:chronically ill appearing male in NAD Heart:RRR, no mrg Lungs:+crackles in bases, nml WOB on RA Abdomen:soft, mildly distended Extremities:no LE edema Dialysis Access: temp cath in R IJ   Filed Weights   09/11/22 0010 09/12/22 2058  Weight: 73 kg 72.9 kg    Intake/Output Summary (Last 24 hours) at 09/13/2022 1206 Last data filed at 09/13/2022 0800 Gross per 24 hour  Intake 300 ml  Output 3200 ml  Net -2900 ml    Additional Objective Labs: Basic Metabolic Panel: Recent Labs  Lab 09/08/22 0340 09/09/22 1910 09/11/22 0048 09/11/22 0101  NA 132* 134* 131* 129*  K 3.9 4.2 4.2 4.3  CL 95* 96* 95* 100  CO2 24 22 22   --   GLUCOSE 216* 179* 129* 127*  BUN 33* 47* 61* 60*  CREATININE 5.90* 7.89* 9.43* 11.40*  CALCIUM 7.9* 8.0* 7.3*  --   PHOS 3.6  --   --   --    Liver Function Tests: Recent Labs  Lab 09/07/22 0010 09/08/22 0340  AST 44* 73*  ALT 42 54*  ALKPHOS 382* 341*  BILITOT 0.9 0.8  PROT 7.4 7.0  ALBUMIN 3.1* 2.8*   CBC: Recent Labs  Lab 09/07/22 0010 09/08/22 0340 09/09/22 1910 09/11/22 0048 09/11/22 0101  WBC 8.6 7.4 5.4 6.0  --   NEUTROABS 6.9 5.9  --  4.0  --   HGB 9.5* 9.3* 10.3* 9.5* 11.2*  HCT 29.5* 29.3* 33.0* 30.2* 33.0*  MCV 86.8 86.9 87.8 88.0  --   PLT 345 322 318 303  --    Blood Culture    Component Value Date/Time   SDES BLOOD LEFT HAND 09/07/2022 0017   SPECREQUEST  09/07/2022 0017    BOTTLES DRAWN AEROBIC ONLY Blood Culture  adequate volume   CULT  09/07/2022 0017    NO GROWTH 5 DAYS Performed at [facility], [address], [place], [place] [address]    REPTSTATUS 09/12/2022 FINAL 09/07/2022 0017    CBG: Recent Labs  Lab 09/12/22 0011 09/12/22 0407 09/12/22 0820 09/12/22 1151 09/12/22 1620  GLUCAP 153* 103* 143* 105* 163*    Studies/Results: IR Fluoro Guide CV Line Right  Result Date: 09/12/2022 INDICATION: Need for dialysis access EXAM: Placement of temporary hemodialysis catheter using ultrasound and fluoroscopic guidance MEDICATIONS: None ANESTHESIA/SEDATION: Local analgesia FLUOROSCOPY TIME:  Fluoroscopy Time: 0.1 minutes (1 mGy) COMPLICATIONS: None immediate. PROCEDURE: Informed written consent was obtained from the patient after a thorough discussion of the procedural risks, benefits and alternatives. All questions were addressed. Maximal Sterile Barrier Technique was utilized including caps, mask, sterile gowns, sterile gloves, sterile drape, hand hygiene and skin antiseptic. A timeout was performed prior to the initiation of the procedure. The patient was placed supine on the exam table. The right neck and chest was prepped and draped in the standard sterile fashion.  Ultrasound was used to evaluate the right internal jugular vein, which was found to be widely patent. A permanent image was stored in the electronic medical record. Using ultrasound guidance, the right internal jugular vein was directly punctured using a 21 gauge micropuncture set. Access site was serially dilated to accommodate an 035 wire, which was advanced into the IVC. Subsequently, serial tract dilation was performed and a 13 French temporary triple-lumen hemodialysis catheter was advanced into the central veins, such that the tip overlies the superior cavoatrial junction. The catheter was found to aspirate and flush appropriately. It was locked with the appropriate volume of heparinized saline. It was secured to the skin using  silk suture and a sterile dressing. The patient tolerated the procedure well without immediate complication. IMPRESSION: Successful placement of a temporary triple-lumen hemodialysis catheter via the right internal jugular vein. The line is ready for immediate use. Electronically Signed   By: Olive Bass M.D.   On: 09/12/2022 15:27   IR US Guide Vasc Access Right  Result Date: 09/12/2022 INDICATION: Need for dialysis access EXAM: Placement of temporary hemodialysis catheter using ultrasound and fluoroscopic guidance MEDICATIONS: None ANESTHESIA/SEDATION: Local analgesia FLUOROSCOPY TIME:  Fluoroscopy Time: 0.1 minutes (1 mGy) COMPLICATIONS: None immediate. PROCEDURE: Informed written consent was obtained from the patient after a thorough discussion of the procedural risks, benefits and alternatives. All questions were addressed. Maximal Sterile Barrier Technique was utilized including caps, mask, sterile gowns, sterile gloves, sterile drape, hand hygiene and skin antiseptic. A timeout was performed prior to the initiation of the procedure. The patient was placed supine on the exam table. The right neck and chest was prepped and draped in the standard sterile fashion. Ultrasound was used to evaluate the right internal jugular vein, which was found to be widely patent. A permanent image was stored in the electronic medical record. Using ultrasound guidance, the right internal jugular vein was directly punctured using a 21 gauge micropuncture set. Access site was serially dilated to accommodate an 035 wire, which was advanced into the IVC. Subsequently, serial tract dilation was performed and a 13 French temporary triple-lumen hemodialysis catheter was advanced into the central veins, such that the tip overlies the superior cavoatrial junction. The catheter was found to aspirate and flush appropriately. It was locked with the appropriate volume of heparinized saline. It was secured to the skin using silk suture  and a sterile dressing. The patient tolerated the procedure well without immediate complication. IMPRESSION: Successful placement of a temporary triple-lumen hemodialysis catheter via the right internal jugular vein. The line is ready for immediate use. Electronically Signed   By: Olive Bass M.D.   On: 09/12/2022 15:27   IR THROMBECTOMY AV FISTULA W/THROMBOLYSIS INC/SHUNT/IMG RIGHT  Result Date: 09/12/2022 INDICATION: 52 year old male presents for thrombectomy of occluded right upper extremity dialysis circuit EXAM: ULTRASOUND-GUIDED ACCESS RIGHT UPPER EXTREMITY DIALYSIS CIRCUIT INTENTION FOR THROMBECTOMY OF DIALYSIS CIRCUIT DISCONTINUATION OF PROCEDURE MEDICATIONS: None. ANESTHESIA/SEDATION: Moderate (conscious) sedation was employed during this procedure. A total of Versed 1.0 mg and Fentanyl 50 mcg was administered intravenously by the radiology nurse. Total intra-service moderate Sedation Time: 10 minutes. The patient's level of consciousness and vital signs were monitored continuously by radiology nursing throughout the procedure under my direct supervision. FLUOROSCOPY: Radiation Exposure Index (as provided by the fluoroscopic device): 0 mGy Kerma COMPLICATIONS: SIR category C PROCEDURE: The procedure, risks, benefits, and alternatives were explained to the patient and the patient's family, including bleeding, infection, arterial thrombus, venous thrombus, vessel injury, need for  further procedure, need for stenting, contrast reaction, need for catheter placement, cardiopulmonary collapse, death. Questions regarding the procedure were encouraged and answered. The patient understands and consents to the procedure. Ultrasound survey was performed with images stored and sent to PACs. The right upper extremity was prepped and draped in the usual sterile fashion. Moderate sedation was initiated Ultrasound guidance was used to access the upper extremity graft with a micropuncture kit. At this point during  the procedure, the patient became unresponsive and respiratory distress was treated with airway management and narcan reversal of the fentanyl. Once the patient was arousable and vital signs were stabilized, we withdrew from the case. No significant blood loss was encountered. IMPRESSION: Attempt at right upper extremity dialysis circuit thrombectomy was aborted secondary to respiratory distress. ACCESS: The team will need to make arrangements for treatment after interval care. Electronically Signed   By: Gilmer Mor D.O.   On: 09/12/2022 08:50   IR US Guide Vasc Access Right  Result Date: 09/12/2022 INDICATION: 52 year old male presents for thrombectomy of occluded right upper extremity dialysis circuit EXAM: ULTRASOUND-GUIDED ACCESS RIGHT UPPER EXTREMITY DIALYSIS CIRCUIT INTENTION FOR THROMBECTOMY OF DIALYSIS CIRCUIT DISCONTINUATION OF PROCEDURE MEDICATIONS: None. ANESTHESIA/SEDATION: Moderate (conscious) sedation was employed during this procedure. A total of Versed 1.0 mg and Fentanyl 50 mcg was administered intravenously by the radiology nurse. Total intra-service moderate Sedation Time: 10 minutes. The patient's level of consciousness and vital signs were monitored continuously by radiology nursing throughout the procedure under my direct supervision. FLUOROSCOPY: Radiation Exposure Index (as provided by the fluoroscopic device): 0 mGy Kerma COMPLICATIONS: SIR category C PROCEDURE: The procedure, risks, benefits, and alternatives were explained to the patient and the patient's family, including bleeding, infection, arterial thrombus, venous thrombus, vessel injury, need for further procedure, need for stenting, contrast reaction, need for catheter placement, cardiopulmonary collapse, death. Questions regarding the procedure were encouraged and answered. The patient understands and consents to the procedure. Ultrasound survey was performed with images stored and sent to PACs. The right upper extremity was  prepped and draped in the usual sterile fashion. Moderate sedation was initiated Ultrasound guidance was used to access the upper extremity graft with a micropuncture kit. At this point during the procedure, the patient became unresponsive and respiratory distress was treated with airway management and narcan reversal of the fentanyl. Once the patient was arousable and vital signs were stabilized, we withdrew from the case. No significant blood loss was encountered. IMPRESSION: Attempt at right upper extremity dialysis circuit thrombectomy was aborted secondary to respiratory distress. ACCESS: The team will need to make arrangements for treatment after interval care. Electronically Signed   By: Gilmer Mor D.O.   On: 09/12/2022 08:50    Medications:   amLODipine  10 mg Oral Daily   aspirin EC  81 mg Oral Daily   calcium acetate  2,001 mg Oral TID with meals   carvedilol  6.25 mg Oral BID WC   Chlorhexidine Gluconate Cloth  6 each Topical Daily   heparin  5,000 Units Subcutaneous Q8H   insulin aspart  0-15 Units Subcutaneous Q4H   insulin glargine-yfgn  15 Units Subcutaneous Daily   isosorbide-hydrALAZINE  1 tablet Oral TID   pantoprazole  40 mg Oral Daily   sevelamer carbonate  800 mg Oral TID WC    Dialysis Orders: Center: [facility], 4 hs, TTS, EDW 61.5 kg , 2K, 2.5 Ca, heparin 2000, Mircera 100 every 2 weeks last given 4/19, Venofer 100 last dose 4/25, cal. 0.25  mcg Access right forearm AV GG    Assessment/Plan Clotted  R FA AVGG-consulting IR for declot, unfortunately yesterday decompensation after sedation, could be related to uremia missed dialysis and  volume.  Temporary catheter placed yesterday with dialysis overnight.  Declot graft versus PermCath placement at another time after hemodialysis - IR unable to get to patient until likely Monday.  Concerned patient may leave AMA with temp cath in place.  Will have it removed tomorrow post HD with plan for [facility] vs declot on Monday. Will reach out  to [facility] to see if they have an available spot in case patient is discharged/leaves AMA over the weekend.  Otherwise can be completed here by IR on Monday. ESRD -HD TTS, chronic non compliance now with clotted AVG.  Plan for HD tomorrow per regular schedule using temp cath that will be removed post.  Hypertension/volume  - Bp in goal. Meds on hold - amlodipine, carvedilol and Imdur.  Volume overloaded, continue UF as tolerated.  Anemia  -Hgb 11.2, no ESA needed currently follow-up trend Metabolic bone disease -  corrected calcium and phos ok.  Continue binders and VDRA.    Virgina Norfolk, PA-C [facility] 09/13/2022,12:06 PM  LOS: 2 days   Patient seen and examined, agree with above note with above modifications.  Such a complicated hosp for this poor guy.  Was able to get HD overnight via temp cath -  resting very comfortably.  Our plan is to do HD tomorrow at which time we can remove the temp cath-  we have him an appt at [facility] on Monday to get access established as OP as we fear he may not stay the weekend-  however, if he is OK to stay the weekend IR can do the procedure  Annie Sable, MD 09/13/2022

## 2022-09-13 NOTE — Progress Notes (Signed)
Patient ID: [name], male   DOB: [birth year], 52 y.o.   MRN: [id number]  Found patient in shower again. Has been educated multiple times about not getting his dialysis catheter wet.

## 2022-09-14 DIAGNOSIS — T82868A Thrombosis of vascular prosthetic devices, implants and grafts, initial encounter: Secondary | ICD-10-CM | POA: Diagnosis not present

## 2022-09-14 LAB — RENAL FUNCTION PANEL
Albumin: 2.7 g/dL — ABNORMAL LOW (ref 3.5–5.0)
Anion gap: 14 (ref 5–15)
BUN: 50 mg/dL — ABNORMAL HIGH (ref 6–20)
CO2: 22 mmol/L (ref 22–32)
Calcium: 7 mg/dL — ABNORMAL LOW (ref 8.9–10.3)
Chloride: 96 mmol/L — ABNORMAL LOW (ref 98–111)
Creatinine, Ser: 8.21 mg/dL — ABNORMAL HIGH (ref 0.61–1.24)
GFR, Estimated: 7 mL/min — ABNORMAL LOW (ref >60)
Glucose, Bld: 211 mg/dL — ABNORMAL HIGH (ref 70–99)
Phosphorus: 4.2 mg/dL (ref 2.5–4.6)
Potassium: 4.2 mmol/L (ref 3.5–5.1)
Sodium: 132 mmol/L — ABNORMAL LOW (ref 135–145)

## 2022-09-14 LAB — GLUCOSE, CAPILLARY
Glucose-Capillary: 136 mg/dL — ABNORMAL HIGH (ref 70–99)
Glucose-Capillary: 140 mg/dL — ABNORMAL HIGH (ref 70–99)
Glucose-Capillary: 144 mg/dL — ABNORMAL HIGH (ref 70–99)

## 2022-09-14 LAB — CBC
HCT: 30.2 % — ABNORMAL LOW (ref 39.0–52.0)
Hemoglobin: 9.1 g/dL — ABNORMAL LOW (ref 13.0–17.0)
MCH: 27.1 pg (ref 26.0–34.0)
MCHC: 30.1 g/dL (ref 30.0–36.0)
MCV: 89.9 fL (ref 80.0–100.0)
Platelets: 349 10*3/uL (ref 150–400)
RBC: 3.36 MIL/uL — ABNORMAL LOW (ref 4.22–5.81)
RDW: 16.6 % — ABNORMAL HIGH (ref 11.5–15.5)
WBC: 9.3 10*3/uL (ref 4.0–10.5)
nRBC: 0 % (ref 0.0–0.2)

## 2022-09-14 LAB — HEPATITIS B SURFACE ANTIBODY, QUANTITATIVE: Hep B S AB Quant (Post): <3.5 m[IU]/mL — ABNORMAL LOW (ref >9.9)

## 2022-09-14 MED ORDER — LIDOCAINE HCL (PF) 1 % IJ SOLN: 5.0000 mL | INTRAMUSCULAR | Status: DC | PRN

## 2022-09-14 MED ORDER — ONDANSETRON HCL 4 MG/2ML IJ SOLN: 4.0000 mg | Freq: Four times a day (QID) | INTRAMUSCULAR | Status: DC | PRN

## 2022-09-14 MED ORDER — ALTEPLASE 2 MG IJ SOLR: 2.0000 mg | Freq: Once | INTRAMUSCULAR | Status: DC | PRN

## 2022-09-14 MED ORDER — ANTICOAGULANT SODIUM CITRATE 4% (200MG/5ML) IV SOLN: 5.0000 mL | Status: DC | PRN

## 2022-09-14 MED ORDER — GUAIFENESIN ER 600 MG PO TB12
600.0000 mg | ORAL_TABLET | Freq: Two times a day (BID) | ORAL | Status: DC
  Administered 2022-09-14 (×2): 600 mg via ORAL
  Filled 2022-09-14 (×3): qty 1

## 2022-09-14 MED ORDER — PENTAFLUOROPROP-TETRAFLUOROETH EX AERO: 1.0000 | INHALATION_SPRAY | CUTANEOUS | Status: DC | PRN

## 2022-09-14 MED ORDER — LIDOCAINE-PRILOCAINE 2.5-2.5 % EX CREA: 1.0000 | TOPICAL_CREAM | CUTANEOUS | Status: DC | PRN

## 2022-09-14 MED ORDER — HEPARIN SODIUM (PORCINE) 1000 UNIT/ML DIALYSIS: 1000.0000 [IU] | INTRAMUSCULAR | Status: DC | PRN

## 2022-09-14 MED ORDER — HEPARIN SODIUM (PORCINE) 1000 UNIT/ML IJ SOLN
INTRAMUSCULAR | Status: AC
  Filled 2022-09-14: qty 3

## 2022-09-14 NOTE — Progress Notes (Signed)
PROGRESS NOTE    KAMERON BLETHEN  WUJ:[id number] DOB: 08-24-1970 DOA: 09/10/2022 PCP: [name]   Brief Narrative:  This is a 52 year old male with past medical history of ESRD, HTN, HLD, homelessness, and anxiety, presented with AV fistula malfunction.  He was at hemodialysis and was unable to be dialyzed secondary to his fistula malfunction.  They have been contacting nephrology who recommended admission.    Assessment & Plan:   Principal Problem:   Hemodialysis AV fistula thrombosis, initial encounter (HCC) Active Problems:   End-stage renal disease on hemodialysis (HCC)   Homelessness   Essential hypertension   DM2 (diabetes mellitus, type 2) (HCC)   Hypocalcemia   Anxiety disorder, unspecified   AV fistula occlusion (HCC)  AV fistula likely thrombosis/malfunction/ESRD on HD: IR on board.  They attempted to declot this for 2424 but after receiving small dose of Versed, reportedly, patient became hypoxic to the point that he was almost coding, he was given Narcan and revived.  He was hypertensive and then became normalized.  The procedure was aborted and he was sent back to the floor.  Patient eventually had temporary dialysis catheter and dialysis on 09/12/2022.  Per nephrology, next dialysis is tomorrow.  Discussed with IR and nephrology, per nephrology, he will need permanent tunneled dialysis catheter or declot of fistula before discharge, discussed with IR and they are planning to declot Monday.  Patient has been very noncompliant historically.  He has been refusing care, sometimes medications and other stuff here.  He has been making statements that he wants to leave.  IR has been informed that patient is very unstable and noncompliant and is at high risk of leaving AMA and I am not sure if he will be here by Monday.  They were requested to expedite the process however there was no opening for him.  So far he is scheduled for the procedure on Monday.  Type 2 diabetes mellitus:  Patient on 15 units of Semglee and SSI, blood sugar controlled.  Essential hypertension:  home medications including amlodipine, Imdur, Coreg have been resumed, blood pressure controlled.  DVT prophylaxis: heparin injection 5,000 Units Start: 09/11/22 0600   Code Status: Full Code  Family Communication:  None present at bedside.    Status is: Inpatient Remains inpatient appropriate because: Needs declotting of the fistula or permanent tunneled dialysis catheter before discharge.   Estimated body mass index is 23.41 kg/m as calculated from the following:   Height as of this encounter: 5\' 7"  (1.702 m).   Weight as of this encounter: 67.8 kg.    Nutritional Assessment: Body mass index is 23.41 kg/m.Marland Kitchen Seen by dietician.  I agree with the assessment and plan as outlined below: Nutrition Status:        . Skin Assessment: I have examined the patient's skin and I agree with the wound assessment as performed by the wound care RN as outlined below:    Consultants:  IR and nephrology  Procedures:  None  Antimicrobials:  Anti-infectives (From admission, onward)    None         Subjective: Seen and remained in dialysis unit.  Once again, as usual, he preferred to sleep and not answer questions and appeared comfortable.  Objective: Vitals:   09/14/22 1002 09/14/22 1030 09/14/22 1105 09/14/22 1144  BP: (!) 150/72 (!) 149/78 (!) 155/85 (!) 148/86  Pulse: 83 72 82 84  Resp: 16 16 (!) 23 20  Temp:   98.4 F (36.9 C) 98 F (  36.7 C)  TempSrc:    Axillary  SpO2: 97% 99% 96% 98%  Weight:      Height:        Intake/Output Summary (Last 24 hours) at 09/14/2022 1155 Last data filed at 09/14/2022 1105 Gross per 24 hour  Intake 850 ml  Output 300 ml  Net 550 ml    Filed Weights   09/12/22 2058 09/14/22 0840 09/14/22 0948  Weight: 72.9 kg 67.8 kg 67.8 kg    Examination:  General exam: Appears calm and comfortable  Respiratory system: Clear to auscultation.  Respiratory effort normal. Cardiovascular system: S1 & S2 heard, RRR. No JVD, murmurs, rubs, gallops or clicks. No pedal edema. Gastrointestinal system: Abdomen is nondistended, soft and nontender. No organomegaly or masses felt. Normal bowel sounds heard.   Data Reviewed: I have personally reviewed following labs and imaging studies  CBC: Recent Labs  Lab 09/08/22 0340 09/09/22 1910 09/11/22 0048 09/11/22 0101 09/14/22 0927  WBC 7.4 5.4 6.0  --  9.3  NEUTROABS 5.9  --  4.0  --   --   HGB 9.3* 10.3* 9.5* 11.2* 9.1*  HCT 29.3* 33.0* 30.2* 33.0* 30.2*  MCV 86.9 87.8 88.0  --  89.9  PLT 322 318 303  --  349    Basic Metabolic Panel: Recent Labs  Lab 09/08/22 0340 09/09/22 1910 09/11/22 0048 09/11/22 0101 09/14/22 0926  NA 132* 134* 131* 129* 132*  K 3.9 4.2 4.2 4.3 4.2  CL 95* 96* 95* 100 96*  CO2 24 22 22   --  22  GLUCOSE 216* 179* 129* 127* 211*  BUN 33* 47* 61* 60* 50*  CREATININE 5.90* 7.89* 9.43* 11.40* 8.21*  CALCIUM 7.9* 8.0* 7.3*  --  7.0*  MG 2.0  --  2.0  --   --   PHOS 3.6  --   --   --  4.2    GFR: Estimated Creatinine Clearance: 10.2 mL/min (A) (by C-G formula based on SCr of 8.21 mg/dL (H)). Liver Function Tests: Recent Labs  Lab 09/08/22 0340 09/14/22 0926  AST 73*  --   ALT 54*  --   ALKPHOS 341*  --   BILITOT 0.8  --   PROT 7.0  --   ALBUMIN 2.8* 2.7*    No results for input(s): "LIPASE", "AMYLASE" in the last 168 hours. No results for input(s): "AMMONIA" in the last 168 hours. Coagulation Profile: Recent Labs  Lab 09/08/22 0340  INR 1.3*    Cardiac Enzymes: No results for input(s): "CKTOTAL", "CKMB", "CKMBINDEX", "TROPONINI" in the last 168 hours. BNP (last 3 results) No results for input(s): "PROBNP" in the last 8760 hours. HbA1C: No results for input(s): "HGBA1C" in the last 72 hours. CBG: Recent Labs  Lab 09/13/22 1706 09/13/22 2102 09/13/22 2351 09/14/22 0422 09/14/22 1127  GLUCAP 118* 169* 189* 136* 140*    Lipid  Profile: No results for input(s): "CHOL", "HDL", "LDLCALC", "TRIG", "CHOLHDL", "LDLDIRECT" in the last 72 hours. Thyroid Function Tests: No results for input(s): "TSH", "T4TOTAL", "FREET4", "T3FREE", "THYROIDAB" in the last 72 hours. Anemia Panel: No results for input(s): "VITAMINB12", "FOLATE", "FERRITIN", "TIBC", "IRON", "RETICCTPCT" in the last 72 hours. Sepsis Labs: Recent Labs  Lab 09/08/22 0340  PROCALCITON 1.55     Recent Results (from the past 240 hour(s))  Blood Culture (routine x 2)     Status: None   Collection Time: 09/07/22 12:00 AM   Specimen: BLOOD LEFT ARM  Result Value Ref Range Status   Specimen  Description BLOOD LEFT ARM  Final   Special Requests   Final    BOTTLES DRAWN AEROBIC AND ANAEROBIC Blood Culture adequate volume   Culture   Final    NO GROWTH 5 DAYS Performed at [facility], [address] [place], [place] [address]    Report Status 09/12/2022 FINAL  Final  Blood Culture (routine x 2)     Status: None   Collection Time: 09/07/22 12:17 AM   Specimen: BLOOD LEFT HAND  Result Value Ref Range Status   Specimen Description BLOOD LEFT HAND  Final   Special Requests   Final    BOTTLES DRAWN AEROBIC ONLY Blood Culture adequate volume   Culture   Final    NO GROWTH 5 DAYS Performed at [facility], [address] [place], [place] [address]    Report Status 09/12/2022 FINAL  Final     Radiology Studies: IR Fluoro Guide CV Line Right  Result Date: 09/12/2022 INDICATION: Need for dialysis access EXAM: Placement of temporary hemodialysis catheter using ultrasound and fluoroscopic guidance MEDICATIONS: None ANESTHESIA/SEDATION: Local analgesia FLUOROSCOPY TIME:  Fluoroscopy Time: 0.1 minutes (1 mGy) COMPLICATIONS: None immediate. PROCEDURE: Informed written consent was obtained from the patient after a thorough discussion of the procedural risks, benefits and alternatives. All questions were addressed. Maximal Sterile Barrier Technique was  utilized including caps, mask, sterile gowns, sterile gloves, sterile drape, hand hygiene and skin antiseptic. A timeout was performed prior to the initiation of the procedure. The patient was placed supine on the exam table. The right neck and chest was prepped and draped in the standard sterile fashion. Ultrasound was used to evaluate the right internal jugular vein, which was found to be widely patent. A permanent image was stored in the electronic medical record. Using ultrasound guidance, the right internal jugular vein was directly punctured using a 21 gauge micropuncture set. Access site was serially dilated to accommodate an 035 wire, which was advanced into the IVC. Subsequently, serial tract dilation was performed and a 13 French temporary triple-lumen hemodialysis catheter was advanced into the central veins, such that the tip overlies the superior cavoatrial junction. The catheter was found to aspirate and flush appropriately. It was locked with the appropriate volume of heparinized saline. It was secured to the skin using silk suture and a sterile dressing. The patient tolerated the procedure well without immediate complication. IMPRESSION: Successful placement of a temporary triple-lumen hemodialysis catheter via the right internal jugular vein. The line is ready for immediate use. Electronically Signed   By: Olive Bass M.D.   On: 09/12/2022 15:27   IR US Guide Vasc Access Right  Result Date: 09/12/2022 INDICATION: Need for dialysis access EXAM: Placement of temporary hemodialysis catheter using ultrasound and fluoroscopic guidance MEDICATIONS: None ANESTHESIA/SEDATION: Local analgesia FLUOROSCOPY TIME:  Fluoroscopy Time: 0.1 minutes (1 mGy) COMPLICATIONS: None immediate. PROCEDURE: Informed written consent was obtained from the patient after a thorough discussion of the procedural risks, benefits and alternatives. All questions were addressed. Maximal Sterile Barrier Technique was utilized  including caps, mask, sterile gowns, sterile gloves, sterile drape, hand hygiene and skin antiseptic. A timeout was performed prior to the initiation of the procedure. The patient was placed supine on the exam table. The right neck and chest was prepped and draped in the standard sterile fashion. Ultrasound was used to evaluate the right internal jugular vein, which was found to be widely patent. A permanent image was stored in the electronic medical record. Using ultrasound  guidance, the right internal jugular vein was directly punctured using a 21 gauge micropuncture set. Access site was serially dilated to accommodate an 035 wire, which was advanced into the IVC. Subsequently, serial tract dilation was performed and a 13 French temporary triple-lumen hemodialysis catheter was advanced into the central veins, such that the tip overlies the superior cavoatrial junction. The catheter was found to aspirate and flush appropriately. It was locked with the appropriate volume of heparinized saline. It was secured to the skin using silk suture and a sterile dressing. The patient tolerated the procedure well without immediate complication. IMPRESSION: Successful placement of a temporary triple-lumen hemodialysis catheter via the right internal jugular vein. The line is ready for immediate use. Electronically Signed   By: Olive Bass M.D.   On: 09/12/2022 15:27    Scheduled Meds:  heparin sodium (porcine)       amLODipine  10 mg Oral Daily   aspirin EC  81 mg Oral Daily   calcium acetate  2,001 mg Oral TID with meals   carvedilol  6.25 mg Oral BID WC   Chlorhexidine Gluconate Cloth  6 each Topical Daily   Chlorhexidine Gluconate Cloth  6 each Topical Q0600   guaiFENesin  600 mg Oral BID   heparin  5,000 Units Subcutaneous Q8H   insulin aspart  0-15 Units Subcutaneous Q4H   insulin glargine-yfgn  15 Units Subcutaneous Daily   isosorbide-hydrALAZINE  1 tablet Oral TID   pantoprazole  40 mg Oral Daily    sevelamer carbonate  800 mg Oral TID WC   Continuous Infusions:   LOS: 3 days   Hughie Closs, MD [facility]  09/14/2022, 11:55 AM   *Please note that this is a verbal dictation therefore any spelling or grammatical errors are due to the "[employer] One" system interpretation.  Please page via Amion and do not message via secure chat for urgent patient care matters. Secure chat can be used for non urgent patient care matters.  How to contact the [facility] Attending or Consulting provider 7A - 7P or covering provider during after hours 7P -7A, for this patient?  Check the care team in [facility] and look for a) attending/consulting [facility] provider listed and b) the [facility] team listed. Page or secure chat 7A-7P. Log into [email or web address] and use [place] universal password to access. If you do not have the password, please contact the hospital operator. Locate the [facility] provider you are looking for under [facility] and page to a number that you can be directly reached. If you still have difficulty reaching the provider, please page the [facility] (Director on Call) for the Hospitalists listed on amion for assistance.

## 2022-09-14 NOTE — Plan of Care (Signed)
Pt noncompliant with treatment.

## 2022-09-14 NOTE — Progress Notes (Signed)
Pt taken via bed to dialysis at this time.

## 2022-09-14 NOTE — Procedures (Signed)
Patient was seen on dialysis and the procedure was supervised.  BFR 400  Via temp cath  BP is  143/72.   Patient appears to be tolerating treatment well  Cecille Aver 09/14/2022

## 2022-09-14 NOTE — Progress Notes (Signed)
HDC removed per order with no complications.  Pressure held to achieve hemostasis.  Vaseline/gauze dressing applied.  Aftercare instructions reviewed with patient who verbalized understanding.

## 2022-09-14 NOTE — Progress Notes (Signed)
Received patient in bed,awake alert and oriented x 4 but vet irritable and intentionally ignoring commands from HD staffs.Consent nowhere to be found,he gave verbal consent for his treatment as witnessed by two nurses.  Access used : Right IJ temp catheter that does not worked well,more on positional.  Medicine given: None.  Duration of treatment : 55  minutes out of 3 hrs prescribed.  Fluid removed : 300 cc.  Hemo issue. Once the patient arrived in the unit ,patient was very hard to direct with, he was non cooperative, on the instruction from the staffs. 40 minutes passed before HD was started ,after he moved with a large soft bowel movement. Needed 2 staffs  to cleaned him for the patient was non cooperative.Once the treatment started,HD machine beeps frequently due to its positional problem and patient non cooperation that made it worse.15 minutes after the treatment started,circuit clogged due to patient's non compliance on the instructions.In between setting up a new HD cartridge ,patient had  another bowel movement.Treatment resumed on the second set-up,patient was very annoyed of continous beeping of the HD machine.Explained to patient why the machine was beeping frequently ,after 40 minutes of starting the second HD set-up,patient quit and signed an AMA form.Renal MD. Made aware. While awaiting for transport,patient moved his bowel for the third time.Staffs cleaned the patient before he returned to his room.  Hand off to the patient's nurse.

## 2022-09-14 NOTE — Discharge Summary (Signed)
Physician Discharge Summary  [name] ZOX:[id number] DOB: 12/05/1970 DOA: 09/10/2022  PCP: [name]  Admit date: 09/10/2022 Discharge date: 09/14/2022 30 Day Unplanned Readmission Risk Score    Flowsheet Row ED to Hosp-Admission (Current) from 09/10/2022 in [facility] 16M KIDNEY UNIT  30 Day Unplanned Readmission Risk Score (%) 89.4 Filed at 09/14/2022 1200       This score is the patient's risk of an unplanned readmission within 30 days of being discharged (0 -100%). The score is based on dignosis, age, lab data, medications, orders, and past utilization.   Low:  0-14.9   Medium: 15-21.9   High: 22-29.9   Extreme: 30 and above          Admitted From: Home Disposition: Home  Recommendations for Outpatient Follow-up:  Follow up with PCP in 1-2 weeks Please obtain BMP/CBC in one week [address] [place], [place] [address] - Has an appointment time at 8:30 AM for declot vs TDC placement and needs to arrive by 7:45am  Please follow up with your PCP on the following pending results: Unresulted Labs (From admission, onward)    None         Home Health: None Equipment/Devices: None  Discharge Condition: Stable CODE STATUS: Full code Diet recommendation: Renal  Subjective: Seen and examined.  No complaints.  I was just informed by the nurse that patient now desires to go home.  Discussed with Dr. Kathrene Bongo nephrology, she informed me that patient is stable and cleared for discharge from nephrology standpoint as they have already scheduled for the patient to have his catheter declotted on Monday morning at CKD.  Address is listed above and on the discharge paperwork of the patient.  Brief/Interim Summary: This is a 52 year old male with past medical history of ESRD, HTN, HLD, homelessness, and anxiety, presented with AV fistula malfunction.  He was at hemodialysis and was unable to be dialyzed secondary to his fistula malfunction.  He was admitted under  hospital service.  AV fistula likely thrombosis/malfunction/ESRD on HD: IR on board.  They attempted to declot this for 2424 but after receiving small dose of Versed, reportedly, patient became hypoxic to the point that he was almost coding, he was given Narcan and revived.  He was hypertensive and then became normalized.  The procedure was aborted and he was sent back to the floor.  Patient eventually had temporary dialysis catheter and dialysis on 09/12/2022.  Per nephrology, next dialysis is tomorrow.  Discussed with IR and nephrology, per nephrology, he will need permanent tunneled dialysis catheter or declot of fistula before discharge, discussed with IR and they are planning to declot Monday.  As expected, patient eventually requested discharge.  Confirmed with the nephrology that patient is scheduled to go to vascular on Monday morning for the procedure.   Type 2 diabetes mellitus: Resume medications.  Essential hypertension:  home medications including amlodipine, Imdur, Coreg have been resumed, blood pressure controlled.  Please note that patient has a longstanding history of noncompliance.  Despite of multiple hospitalizations and despite of multiple counseling, patient continues to not take his medications.  According to medication reconciliation, he was not taking any of his medications.  I discussed with him and he once again did not want me to prescribe him any medications.  Discharge plan was discussed with patient and/or family member and they verbalized understanding and agreed with it.  Discharge Diagnoses:  Principal Problem:   Hemodialysis AV fistula thrombosis, initial encounter [facility]) Active Problems:  End-stage renal disease on hemodialysis (HCC)   Homelessness   Essential hypertension   DM2 (diabetes mellitus, type 2) (HCC)   Hypocalcemia   Anxiety disorder, unspecified   AV fistula occlusion [facility])    Discharge Instructions   Allergies as of 09/14/2022        Reactions   Sulfa Antibiotics Rash   Severe rash.    Keflex [cephalexin] Other (See Comments)   Patient with severe drug reaction including exfoliating skin rash and hypotension after being prescribed both cephalexin and sulfamethoxazole-trimethoprim simultaneously. Favor SMX as much more likely culprit as patient had tolerated other cephalosporins in the past, but cannot say with complete certainty that this was not related to cephalexin.    Other Other (See Comments)   Patient states he is allergic to the TB test - unknown reaction during childhood    Bactrim [sulfamethoxazole-trimethoprim] Rash        Medication List     TAKE these medications    Accu-Chek Guide test strip Generic drug: glucose blood Use to check fasting blood sugar once daily. 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 Insulin dependent   OneTouch Verio test strip Generic drug: glucose blood Use as directed up to 4 times daily   Acetaminophen Extra Strength 500 MG Tabs Take 1 tablet (500 mg total) by mouth every 8 (eight) hours as needed for mild pain.   amLODipine 10 MG tablet Commonly known as: NORVASC Take 1 tablet (10 mg total) by mouth daily.   Aspirin Low Dose 81 MG tablet Generic drug: aspirin EC Take 1 tablet (81 mg total) by mouth daily.   benzonatate 100 MG capsule Commonly known as: TESSALON Take 1 capsule (100 mg total) by mouth every 8 (eight) hours.   calcium acetate 667 MG capsule Commonly known as: PHOSLO Take 3 capsules (2,001 mg total) by mouth 3 (three) times daily.   carvedilol 6.25 MG tablet Commonly known as: COREG Take 1 tablet (6.25 mg total) by mouth 2 (two) times daily with a meal.   doxycycline 100 MG tablet Commonly known as: VIBRA-TABS Take 1 tablet (100 mg total) by mouth 2 (two) times daily for 7 days.   insulin detemir 100 UNIT/ML injection Commonly known as: LEVEMIR Inject 0.35 mLs (35 Units total) into the skin daily. max daily dose 100 units per day   Insulin Syringe-Needle U-100  25G X 1" 1 ML Misc For 4 times a day insulin SQ, 1 month supply. Diagnosis E11.65   isosorbide-hydrALAZINE 20-37.5 MG tablet Commonly known as: BIDIL Take 1 tablet by mouth 3 (three) times daily.   Lantus SoloStar 100 UNIT/ML Solostar Pen Generic drug: insulin glargine Inject 35 Units into the skin daily.Not to exceed 100 units per day.   levofloxacin 500 MG tablet Commonly known as: Levaquin Take 1 tablet (500 mg total) by mouth daily for 5 days.   NovoLOG FlexPen 100 UNIT/ML FlexPen Generic drug: insulin aspart Before each meal 3 times a day, 140-199 - 2 units, 200-250 - 4 units, 251-299 - 6 units,  300-349 - 8 units,  350 or above 10 units.   OneTouch Delica Plus Lancet33G Misc Use as directed up to 4 times daily.   oxyCODONE-acetaminophen 5-325 MG tablet Commonly known as: PERCOCET/ROXICET Take 1 tablet by mouth every 12 (twelve) hours as needed for severe pain.   Protonix 40 MG tablet Generic drug: pantoprazole Take 1 tablet (40 mg total) by mouth daily.   sevelamer carbonate 800 MG tablet Commonly known as: Renvela Take 2 tablets (1,600 mg  total) by mouth 3 (three) times daily with meals. May also take 1 tablet (800 mg total) 2 (two) times daily as needed with snacks.   TechLite Pen Needles 32G X 4 MM Misc Generic drug: Insulin Pen Needle Use with insulin pens        Follow-up Information     [facility] Follow up in 1 week(s).                 Allergies  Allergen Reactions   Sulfa Antibiotics Rash    Severe rash.    Keflex [Cephalexin] Other (See Comments)    Patient with severe drug reaction including exfoliating skin rash and hypotension after being prescribed both cephalexin and sulfamethoxazole-trimethoprim simultaneously. Favor SMX as much more likely culprit as patient had tolerated other cephalosporins in the past, but cannot say with complete certainty that this was not related to cephalexin.    Other Other (See Comments)    Patient states  he is allergic to the TB test - unknown reaction during childhood    Bactrim [Sulfamethoxazole-Trimethoprim] Rash    Consultations: Nephrology   Procedures/Studies: IR Fluoro Guide CV Line Right  Result Date: 09/12/2022 INDICATION: Need for dialysis access EXAM: Placement of temporary hemodialysis catheter using ultrasound and fluoroscopic guidance MEDICATIONS: None ANESTHESIA/SEDATION: Local analgesia FLUOROSCOPY TIME:  Fluoroscopy Time: 0.1 minutes (1 mGy) COMPLICATIONS: None immediate. PROCEDURE: Informed written consent was obtained from the patient after a thorough discussion of the procedural risks, benefits and alternatives. All questions were addressed. Maximal Sterile Barrier Technique was utilized including caps, mask, sterile gowns, sterile gloves, sterile drape, hand hygiene and skin antiseptic. A timeout was performed prior to the initiation of the procedure. The patient was placed supine on the exam table. The right neck and chest was prepped and draped in the standard sterile fashion. Ultrasound was used to evaluate the right internal jugular vein, which was found to be widely patent. A permanent image was stored in the electronic medical record. Using ultrasound guidance, the right internal jugular vein was directly punctured using a 21 gauge micropuncture set. Access site was serially dilated to accommodate an 035 wire, which was advanced into the IVC. Subsequently, serial tract dilation was performed and a 13 French temporary triple-lumen hemodialysis catheter was advanced into the central veins, such that the tip overlies the superior cavoatrial junction. The catheter was found to aspirate and flush appropriately. It was locked with the appropriate volume of heparinized saline. It was secured to the skin using silk suture and a sterile dressing. The patient tolerated the procedure well without immediate complication. IMPRESSION: Successful placement of a temporary triple-lumen  hemodialysis catheter via the right internal jugular vein. The line is ready for immediate use. Electronically Signed   By: Olive Bass M.D.   On: 09/12/2022 15:27   IR US Guide Vasc Access Right  Result Date: 09/12/2022 INDICATION: Need for dialysis access EXAM: Placement of temporary hemodialysis catheter using ultrasound and fluoroscopic guidance MEDICATIONS: None ANESTHESIA/SEDATION: Local analgesia FLUOROSCOPY TIME:  Fluoroscopy Time: 0.1 minutes (1 mGy) COMPLICATIONS: None immediate. PROCEDURE: Informed written consent was obtained from the patient after a thorough discussion of the procedural risks, benefits and alternatives. All questions were addressed. Maximal Sterile Barrier Technique was utilized including caps, mask, sterile gowns, sterile gloves, sterile drape, hand hygiene and skin antiseptic. A timeout was performed prior to the initiation of the procedure. The patient was placed supine on the exam table. The right neck and chest was prepped and draped in the  standard sterile fashion. Ultrasound was used to evaluate the right internal jugular vein, which was found to be widely patent. A permanent image was stored in the electronic medical record. Using ultrasound guidance, the right internal jugular vein was directly punctured using a 21 gauge micropuncture set. Access site was serially dilated to accommodate an 035 wire, which was advanced into the IVC. Subsequently, serial tract dilation was performed and a 13 French temporary triple-lumen hemodialysis catheter was advanced into the central veins, such that the tip overlies the superior cavoatrial junction. The catheter was found to aspirate and flush appropriately. It was locked with the appropriate volume of heparinized saline. It was secured to the skin using silk suture and a sterile dressing. The patient tolerated the procedure well without immediate complication. IMPRESSION: Successful placement of a temporary triple-lumen hemodialysis  catheter via the right internal jugular vein. The line is ready for immediate use. Electronically Signed   By: Olive Bass M.D.   On: 09/12/2022 15:27   IR THROMBECTOMY AV FISTULA W/THROMBOLYSIS INC/SHUNT/IMG RIGHT  Result Date: 09/12/2022 INDICATION: 51 year old male presents for thrombectomy of occluded right upper extremity dialysis circuit EXAM: ULTRASOUND-GUIDED ACCESS RIGHT UPPER EXTREMITY DIALYSIS CIRCUIT INTENTION FOR THROMBECTOMY OF DIALYSIS CIRCUIT DISCONTINUATION OF PROCEDURE MEDICATIONS: None. ANESTHESIA/SEDATION: Moderate (conscious) sedation was employed during this procedure. A total of Versed 1.0 mg and Fentanyl 50 mcg was administered intravenously by the radiology nurse. Total intra-service moderate Sedation Time: 10 minutes. The patient's level of consciousness and vital signs were monitored continuously by radiology nursing throughout the procedure under my direct supervision. FLUOROSCOPY: Radiation Exposure Index (as provided by the fluoroscopic device): 0 mGy Kerma COMPLICATIONS: SIR category C PROCEDURE: The procedure, risks, benefits, and alternatives were explained to the patient and the patient's family, including bleeding, infection, arterial thrombus, venous thrombus, vessel injury, need for further procedure, need for stenting, contrast reaction, need for catheter placement, cardiopulmonary collapse, death. Questions regarding the procedure were encouraged and answered. The patient understands and consents to the procedure. Ultrasound survey was performed with images stored and sent to PACs. The right upper extremity was prepped and draped in the usual sterile fashion. Moderate sedation was initiated Ultrasound guidance was used to access the upper extremity graft with a micropuncture kit. At this point during the procedure, the patient became unresponsive and respiratory distress was treated with airway management and narcan reversal of the fentanyl. Once the patient was  arousable and vital signs were stabilized, we withdrew from the case. No significant blood loss was encountered. IMPRESSION: Attempt at right upper extremity dialysis circuit thrombectomy was aborted secondary to respiratory distress. ACCESS: The team will need to make arrangements for treatment after interval care. Electronically Signed   By: Gilmer Mor D.O.   On: 09/12/2022 08:50   IR US Guide Vasc Access Right  Result Date: 09/12/2022 INDICATION: 52 year old male presents for thrombectomy of occluded right upper extremity dialysis circuit EXAM: ULTRASOUND-GUIDED ACCESS RIGHT UPPER EXTREMITY DIALYSIS CIRCUIT INTENTION FOR THROMBECTOMY OF DIALYSIS CIRCUIT DISCONTINUATION OF PROCEDURE MEDICATIONS: None. ANESTHESIA/SEDATION: Moderate (conscious) sedation was employed during this procedure. A total of Versed 1.0 mg and Fentanyl 50 mcg was administered intravenously by the radiology nurse. Total intra-service moderate Sedation Time: 10 minutes. The patient's level of consciousness and vital signs were monitored continuously by radiology nursing throughout the procedure under my direct supervision. FLUOROSCOPY: Radiation Exposure Index (as provided by the fluoroscopic device): 0 mGy Kerma COMPLICATIONS: SIR category C PROCEDURE: The procedure, risks, benefits, and alternatives were explained to the patient  and the patient's family, including bleeding, infection, arterial thrombus, venous thrombus, vessel injury, need for further procedure, need for stenting, contrast reaction, need for catheter placement, cardiopulmonary collapse, death. Questions regarding the procedure were encouraged and answered. The patient understands and consents to the procedure. Ultrasound survey was performed with images stored and sent to PACs. The right upper extremity was prepped and draped in the usual sterile fashion. Moderate sedation was initiated Ultrasound guidance was used to access the upper extremity graft with a  micropuncture kit. At this point during the procedure, the patient became unresponsive and respiratory distress was treated with airway management and narcan reversal of the fentanyl. Once the patient was arousable and vital signs were stabilized, we withdrew from the case. No significant blood loss was encountered. IMPRESSION: Attempt at right upper extremity dialysis circuit thrombectomy was aborted secondary to respiratory distress. ACCESS: The team will need to make arrangements for treatment after interval care. Electronically Signed   By: Gilmer Mor D.O.   On: 09/12/2022 08:50   DG Chest Portable 1 View  Result Date: 09/11/2022 CLINICAL DATA:  Shortness of breath. EXAM: PORTABLE CHEST 1 VIEW COMPARISON:  September 09, 2022 FINDINGS: The cardiac silhouette is markedly enlarged and unchanged in size. Mildly increased perihilar interstitial lung markings are seen. This is increased in severity when compared to the prior study. The area of right middle lobe consolidation seen on the prior study is no longer visualized. There is no evidence of a pleural effusion or pneumothorax. The visualized skeletal structures are unremarkable. IMPRESSION: Stable cardiomegaly with findings consistent with mild interstitial edema. Electronically Signed   By: Aram Candela M.D.   On: 09/11/2022 01:05   DG Chest 2 View  Result Date: 09/09/2022 CLINICAL DATA:  Cough EXAM: CHEST - 2 VIEW COMPARISON:  Chest x-ray dated September 08, 2022; chest x-ray dated June 09, 2022; chest CT dated May 24, 2022 FINDINGS: Unchanged cardiomegaly. Unchanged right middle lobe consolidation. No new lung opacity. Redemonstrated fracture of the distal left clavicle. No evidence of pleural effusion or pneumothorax. IMPRESSION: 1. No evidence of acute airspace opacity. 2. Right middle lobe consolidation, similar when compared with multiple prior exams. Given chronicity, consider CT for better evaluation. 3. Redemonstrated fracture of the  distal left clavicle. 4. Unchanged cardiomegaly. Electronically Signed   By: Allegra Lai M.D.   On: 09/09/2022 20:06   DG Chest 2 View  Result Date: 09/08/2022 CLINICAL DATA:  Cough, dyspnea EXAM: CHEST - 2 VIEW COMPARISON:  09/07/2022 FINDINGS: Stable pulmonary insufflation. No pneumothorax or pleural effusion. Stable asymmetric right basilar consolidation. Stable moderate cardiomegaly. Unchanged central pulmonary vascular congestion. No acute bone abnormality. IMPRESSION: 1. Stable pulmonary insufflation. 2. Stable asymmetric right basilar consolidation, asymmetric edema or infection. 3. Stable moderate cardiomegaly with central pulmonary vascular congestion. Electronically Signed   By: Helyn Numbers M.D.   On: 09/08/2022 20:34   DG Chest Port 1 View  Result Date: 09/07/2022 CLINICAL DATA:  Questionable sepsis EXAM: PORTABLE CHEST 1 VIEW COMPARISON:  09/05/2022 FINDINGS: Cardiomegaly with vascular congestion. Bilateral perihilar and lower lobe opacities could reflect edema or pneumonia. No effusions. No acute bony abnormality. IMPRESSION: Cardiomegaly, vascular congestion. Perihilar and lower lobe opacities bilaterally could reflect edema or infection. Electronically Signed   By: Charlett Nose M.D.   On: 09/07/2022 01:12   DG Chest 2 View  Result Date: 09/05/2022 CLINICAL DATA:  Shortness of breath. EXAM: CHEST - 2 VIEW COMPARISON:  Multiple prior exams most recently 08/24/2022 FINDINGS: Chronic cardiomegaly.  Stable mediastinal contours. Similar vascular congestion. Small left pleural effusion. No acute airspace disease. No pneumothorax. Unchanged distal left clavicle fracture. IMPRESSION: Stable cardiomegaly, vascular congestion, and small left pleural effusion. Electronically Signed   By: Narda Rutherford M.D.   On: 09/05/2022 23:32   DG Chest 2 View  Result Date: 08/24/2022 CLINICAL DATA:  Chest pain and cough. EXAM: CHEST - 2 VIEW COMPARISON:  July 19, 2022 FINDINGS: The cardiac silhouette  is markedly enlarged and unchanged in size. Mild, stable perihilar pulmonary vascular prominence is seen. There is no evidence of focal consolidation, pleural effusion or pneumothorax. A subacute fracture deformity is seen involving the distal left clavicle. IMPRESSION: 1. Stable cardiomegaly with chronic central vascular congestion. 2. Subacute fracture of the distal left clavicle. Electronically Signed   By: Aram Candela M.D.   On: 08/24/2022 21:22     Discharge Exam: Vitals:   09/14/22 1105 09/14/22 1144  BP: (!) 155/85 (!) 148/86  Pulse: 82 84  Resp: (!) 23 20  Temp: 98.4 F (36.9 C) 98 F (36.7 C)  SpO2: 96% 98%   Vitals:   09/14/22 1030 09/14/22 1105 09/14/22 1144 09/14/22 1200  BP: (!) 149/78 (!) 155/85 (!) 148/86   Pulse: 72 82 84   Resp: 16 (!) 23 20   Temp:  98.4 F (36.9 C) 98 F (36.7 C)   TempSrc:   Axillary   SpO2: 99% 96% 98%   Weight:    67.4 kg  Height:        General: Pt is alert, awake, not in acute distress Cardiovascular: RRR, S1/S2 +, no rubs, no gallops Respiratory: CTA bilaterally, no wheezing, no rhonchi Abdominal: Soft, NT, ND, bowel sounds + Extremities: no edema, no cyanosis    The results of significant diagnostics from this hospitalization (including imaging, microbiology, ancillary and laboratory) are listed below for reference.     Microbiology: Recent Results (from the past 240 hour(s))  Blood Culture (routine x 2)     Status: None   Collection Time: 09/07/22 12:00 AM   Specimen: BLOOD LEFT ARM  Result Value Ref Range Status   Specimen Description BLOOD LEFT ARM  Final   Special Requests   Final    BOTTLES DRAWN AEROBIC AND ANAEROBIC Blood Culture adequate volume   Culture   Final    NO GROWTH 5 DAYS Performed at [facility], [address], [place], [place] [address]    Report Status 09/12/2022 FINAL  Final  Blood Culture (routine x 2)     Status: None   Collection Time: 09/07/22 12:17 AM   Specimen: BLOOD LEFT HAND   Result Value Ref Range Status   Specimen Description BLOOD LEFT HAND  Final   Special Requests   Final    BOTTLES DRAWN AEROBIC ONLY Blood Culture adequate volume   Culture   Final    NO GROWTH 5 DAYS Performed at [facility], [address] [place], [place] [address]    Report Status 09/12/2022 FINAL  Final     Labs: BNP (last 3 results) Recent Labs    05/25/22 0820 08/24/22 2151 09/08/22 0340  BNP 3,829.2* >4,500.0* >4,500.0*   Basic Metabolic Panel: Recent Labs  Lab 09/08/22 0340 09/09/22 1910 09/11/22 0048 09/11/22 0101 09/14/22 0926  NA 132* 134* 131* 129* 132*  K 3.9 4.2 4.2 4.3 4.2  CL 95* 96* 95* 100 96*  CO2 24 22 22   --  22  GLUCOSE 216* 179* 129* 127*  211*  BUN 33* 47* 61* 60* 50*  CREATININE 5.90* 7.89* 9.43* 11.40* 8.21*  CALCIUM 7.9* 8.0* 7.3*  --  7.0*  MG 2.0  --  2.0  --   --   PHOS 3.6  --   --   --  4.2   Liver Function Tests: Recent Labs  Lab 09/08/22 0340 09/14/22 0926  AST 73*  --   ALT 54*  --   ALKPHOS 341*  --   BILITOT 0.8  --   PROT 7.0  --   ALBUMIN 2.8* 2.7*   No results for input(s): "LIPASE", "AMYLASE" in the last 168 hours. No results for input(s): "AMMONIA" in the last 168 hours. CBC: Recent Labs  Lab 09/08/22 0340 09/09/22 1910 09/11/22 0048 09/11/22 0101 09/14/22 0927  WBC 7.4 5.4 6.0  --  9.3  NEUTROABS 5.9  --  4.0  --   --   HGB 9.3* 10.3* 9.5* 11.2* 9.1*  HCT 29.3* 33.0* 30.2* 33.0* 30.2*  MCV 86.9 87.8 88.0  --  89.9  PLT 322 318 303  --  349   Cardiac Enzymes: No results for input(s): "CKTOTAL", "CKMB", "CKMBINDEX", "TROPONINI" in the last 168 hours. BNP: Invalid input(s): "POCBNP" CBG: Recent Labs  Lab 09/13/22 1706 09/13/22 2102 09/13/22 2351 09/14/22 0422 09/14/22 1127  GLUCAP 118* 169* 189* 136* 140*   D-Dimer No results for input(s): "DDIMER" in the last 72 hours. Hgb A1c No results for input(s): "HGBA1C" in the last 72 hours. Lipid Profile No results for input(s): "CHOL",  "HDL", "LDLCALC", "TRIG", "CHOLHDL", "LDLDIRECT" in the last 72 hours. Thyroid function studies No results for input(s): "TSH", "T4TOTAL", "T3FREE", "THYROIDAB" in the last 72 hours.  Invalid input(s): "FREET3" Anemia work up No results for input(s): "VITAMINB12", "FOLATE", "FERRITIN", "TIBC", "IRON", "RETICCTPCT" in the last 72 hours. Urinalysis    Component Value Date/Time   COLORURINE YELLOW 02/19/2021 0045   APPEARANCEUR HAZY (A) 02/19/2021 0045   APPEARANCEUR Clear 05/09/2020 1509   LABSPEC 1.013 02/19/2021 0045   PHURINE 5.0 02/19/2021 0045   GLUCOSEU >=500 (A) 02/19/2021 0045   HGBUR MODERATE (A) 02/19/2021 0045   BILIRUBINUR NEGATIVE 02/19/2021 0045   BILIRUBINUR Negative 05/09/2020 1509   KETONESUR NEGATIVE 02/19/2021 0045   PROTEINUR >=300 (A) 02/19/2021 0045   UROBILINOGEN 0.2 02/04/2020 1612   UROBILINOGEN 0.2 02/16/2014 0045   NITRITE NEGATIVE 02/19/2021 0045   LEUKOCYTESUR NEGATIVE 02/19/2021 0045   Sepsis Labs Recent Labs  Lab 09/08/22 0340 09/09/22 1910 09/11/22 0048 09/14/22 0927  WBC 7.4 5.4 6.0 9.3   Microbiology Recent Results (from the past 240 hour(s))  Blood Culture (routine x 2)     Status: None   Collection Time: 09/07/22 12:00 AM   Specimen: BLOOD LEFT ARM  Result Value Ref Range Status   Specimen Description BLOOD LEFT ARM  Final   Special Requests   Final    BOTTLES DRAWN AEROBIC AND ANAEROBIC Blood Culture adequate volume   Culture   Final    NO GROWTH 5 DAYS Performed at [facility], [address] [place], [place] [address]    Report Status 09/12/2022 FINAL  Final  Blood Culture (routine x 2)     Status: None   Collection Time: 09/07/22 12:17 AM   Specimen: BLOOD LEFT HAND  Result Value Ref Range Status   Specimen Description BLOOD LEFT HAND  Final   Special Requests   Final    BOTTLES DRAWN AEROBIC ONLY Blood Culture adequate volume  Culture   Final    NO GROWTH 5 DAYS Performed at [facility], [address], [place], [place] [address]    Report Status 09/12/2022 FINAL  Final     Time coordinating discharge: Over 30 minutes  SIGNED:   Hughie Closs, MD  [facility] 09/14/2022, 3:39 PM *Please note that this is a verbal dictation therefore any spelling or grammatical errors are due to the "Dragon Medical One" system interpretation. If 7PM-7AM, please contact night-coverage [email or web address]

## 2022-09-14 NOTE — Progress Notes (Addendum)
[facility] Progress Note   Subjective:   Patient seen and examined in room.  Refuses to go to HD until he eats and gets pants.  Encouraged compliance.  Complains of cough.  Denies SOB, CP, abdominal pain and n/v/d. I saw him in HD-  he is pretty lucid.  I gave him the choice of intervention Monday at [facility] or here -  he wants to leave and go to [facility] on Monday  Objective Vitals:   09/13/22 0027 09/13/22 0535 09/13/22 2102 09/14/22 0426  BP:  (!) 105/94 126/81 119/67  Pulse: 67 68 79 76  Resp:  18 18 16   Temp:  98 F (36.7 C) 98.8 F (37.1 C) 97.9 F (36.6 C)  TempSrc:  Axillary    SpO2: 99% 92% 93% 99%  Weight:      Height:       Physical Exam General:chronically ill appearing, naked male in NAD Heart:RRR, no mrg Lungs:+crackles b/l, nml WOB on RA Abdomen:soft, distended,  Extremities:+LE edema Dialysis Access: Temp cath in place   [facility] Weights   09/11/22 0010 09/12/22 2058  Weight: 73 kg 72.9 kg    Intake/Output Summary (Last 24 hours) at 09/14/2022 0820 Last data filed at 09/14/2022 0600 Gross per 24 hour  Intake 850 ml  Output --  Net 850 ml    Additional Objective Labs: Basic Metabolic Panel: Recent Labs  Lab 09/08/22 0340 09/09/22 1910 09/11/22 0048 09/11/22 0101  NA 132* 134* 131* 129*  K 3.9 4.2 4.2 4.3  CL 95* 96* 95* 100  CO2 24 22 22   --   GLUCOSE 216* 179* 129* 127*  BUN 33* 47* 61* 60*  CREATININE 5.90* 7.89* 9.43* 11.40*  CALCIUM 7.9* 8.0* 7.3*  --   PHOS 3.6  --   --   --    Liver Function Tests: Recent Labs  Lab 09/08/22 0340  AST 73*  ALT 54*  ALKPHOS 341*  BILITOT 0.8  PROT 7.0  ALBUMIN 2.8*   CBC: Recent Labs  Lab 09/08/22 0340 09/09/22 1910 09/11/22 0048 09/11/22 0101  WBC 7.4 5.4 6.0  --   NEUTROABS 5.9  --  4.0  --   HGB 9.3* 10.3* 9.5* 11.2*  HCT 29.3* 33.0* 30.2* 33.0*  MCV 86.9 87.8 88.0  --   PLT 322 318 303  --     CBG: Recent Labs  Lab 09/12/22 1620 09/13/22 1706 09/13/22 2102  09/13/22 2351 09/14/22 0422  GLUCAP 163* 118* 169* 189* 136*    Studies/Results: IR Fluoro Guide CV Line Right  Result Date: 09/12/2022 INDICATION: Need for dialysis access EXAM: Placement of temporary hemodialysis catheter using ultrasound and fluoroscopic guidance MEDICATIONS: None ANESTHESIA/SEDATION: Local analgesia FLUOROSCOPY TIME:  Fluoroscopy Time: 0.1 minutes (1 mGy) COMPLICATIONS: None immediate. PROCEDURE: Informed written consent was obtained from the patient after a thorough discussion of the procedural risks, benefits and alternatives. All questions were addressed. Maximal Sterile Barrier Technique was utilized including caps, mask, sterile gowns, sterile gloves, sterile drape, hand hygiene and skin antiseptic. A timeout was performed prior to the initiation of the procedure. The patient was placed supine on the exam table. The right neck and chest was prepped and draped in the standard sterile fashion. Ultrasound was used to evaluate the right internal jugular vein, which was found to be widely patent. A permanent image was stored in the electronic medical record. Using ultrasound guidance, the right internal jugular vein was directly punctured using a 21 gauge micropuncture set. Access site  was serially dilated to accommodate an 035 wire, which was advanced into the IVC. Subsequently, serial tract dilation was performed and a 13 French temporary triple-lumen hemodialysis catheter was advanced into the central veins, such that the tip overlies the superior cavoatrial junction. The catheter was found to aspirate and flush appropriately. It was locked with the appropriate volume of heparinized saline. It was secured to the skin using silk suture and a sterile dressing. The patient tolerated the procedure well without immediate complication. IMPRESSION: Successful placement of a temporary triple-lumen hemodialysis catheter via the right internal jugular vein. The line is ready for immediate use.  Electronically Signed   By: Olive Bass M.D.   On: 09/12/2022 15:27   IR US Guide Vasc Access Right  Result Date: 09/12/2022 INDICATION: Need for dialysis access EXAM: Placement of temporary hemodialysis catheter using ultrasound and fluoroscopic guidance MEDICATIONS: None ANESTHESIA/SEDATION: Local analgesia FLUOROSCOPY TIME:  Fluoroscopy Time: 0.1 minutes (1 mGy) COMPLICATIONS: None immediate. PROCEDURE: Informed written consent was obtained from the patient after a thorough discussion of the procedural risks, benefits and alternatives. All questions were addressed. Maximal Sterile Barrier Technique was utilized including caps, mask, sterile gowns, sterile gloves, sterile drape, hand hygiene and skin antiseptic. A timeout was performed prior to the initiation of the procedure. The patient was placed supine on the exam table. The right neck and chest was prepped and draped in the standard sterile fashion. Ultrasound was used to evaluate the right internal jugular vein, which was found to be widely patent. A permanent image was stored in the electronic medical record. Using ultrasound guidance, the right internal jugular vein was directly punctured using a 21 gauge micropuncture set. Access site was serially dilated to accommodate an 035 wire, which was advanced into the IVC. Subsequently, serial tract dilation was performed and a 13 French temporary triple-lumen hemodialysis catheter was advanced into the central veins, such that the tip overlies the superior cavoatrial junction. The catheter was found to aspirate and flush appropriately. It was locked with the appropriate volume of heparinized saline. It was secured to the skin using silk suture and a sterile dressing. The patient tolerated the procedure well without immediate complication. IMPRESSION: Successful placement of a temporary triple-lumen hemodialysis catheter via the right internal jugular vein. The line is ready for immediate use.  Electronically Signed   By: Olive Bass M.D.   On: 09/12/2022 15:27    Medications:  anticoagulant sodium citrate      amLODipine  10 mg Oral Daily   aspirin EC  81 mg Oral Daily   calcium acetate  2,001 mg Oral TID with meals   carvedilol  6.25 mg Oral BID WC   Chlorhexidine Gluconate Cloth  6 each Topical Daily   Chlorhexidine Gluconate Cloth  6 each Topical Q0600   heparin  5,000 Units Subcutaneous Q8H   insulin aspart  0-15 Units Subcutaneous Q4H   insulin glargine-yfgn  15 Units Subcutaneous Daily   isosorbide-hydrALAZINE  1 tablet Oral TID   pantoprazole  40 mg Oral Daily   sevelamer carbonate  800 mg Oral TID WC    Dialysis Orders: Center: [facility], 4 hs, TTS, EDW 61.5 kg , 2K, 2.5 Ca, heparin 2000, Mircera 100 every 2 weeks last given 4/19, Venofer 100 last dose 4/25, cal. 0.25 mcg Access right forearm AV GG    Assessment/Plan Clotted  R FA AVGG-consulting IR for declot, unfortunately decompensation after sedation, could be related to uremia missed dialysis and  volume.  Temporary  catheter placed 4/25 with dialysis overnight.  Declot graft versus PermCath placement possibly on Monday by IR. Concerned patient may leave AMA with temp cath in place.  Will have it removed tomorrow post HD with plan for [facility] vs declot on Monday. Has appointment at 8:30 AM at [facility] Monday if leaves AMA over the weekend.  Otherwise can be completed here by IR on Monday.  He says that he wants to leave and go to [facility] on Monday-  says it should not be a problem-  asks for the address  ESRD -HD TTS, chronic non compliance now with clotted AVG.  Plan for HD today per regular schedule using temp cath that will be removed post.  Hypertension/volume  - Bp in goal. Meds on hold - amlodipine, carvedilol and Imdur.  Volume overloaded, continue UF as tolerated.  Anemia  -last Hgb 11.2, no ESA needed currently follow-up trend Metabolic bone disease -  last corrected calcium and phos ok.  Continue binders and VDRA. Non  compliance - refusing medications, delaying HD - continue to educate on importance of compliance.  Virgina Norfolk, PA-C [facility] 09/14/2022,8:20 AM  LOS: 3 days   Patient seen and examined, agree with above note with above modifications. I talked to him-  he agrees to complete treatment today -  have temp cath removed and go to [facility] [address] on Monday AM-  PA willl contact primary and put information of that appt on discharge summary  Annie Sable, MD 09/14/2022

## 2022-09-14 NOTE — Discharge Instructions (Signed)
-  you have an appointment time at 8:30 AM for declot vs TDC placement and needs to arrive by 7:45am at [address] [place], [place] [address].

## 2022-09-15 LAB — GLUCOSE, CAPILLARY: Glucose-Capillary: 102 mg/dL — ABNORMAL HIGH (ref 70–99)

## 2022-09-15 NOTE — Progress Notes (Signed)
Patient refused all morning and noon medications. RN educated patient on the importance of taking medications. Patient continues to refuse. MD at bedside.

## 2022-09-15 NOTE — Progress Notes (Addendum)
[facility] Progress Note   Subjective:   Patient seen and examined at bedside.  Sleeping, will answer question but doesn't open eyes.  States he wants to leave today.  Nausea and vomiting better.  States he has the address and will be able to get to [facility] in the AM.   Objective Vitals:   09/14/22 1200 09/14/22 1708 09/14/22 1712 09/14/22 1802  BP:  (!) 104/59 (!) 104/59 111/68  Pulse:  75 75   Resp:  18 18   Temp:  98 F (36.7 C) 98 F (36.7 C)   TempSrc:   Axillary   SpO2:  (!) 77% 91%   Weight: 67.4 kg     Height:       Physical Exam General:chronically ill appearing male in NAD Heart:RRR, no mrg Lungs:+crackles b/l, nml WOB on RA Abdomen:soft, NTND Extremities: trace LE edema Dialysis Access: none, AVG clotted   Filed Weights   09/14/22 0840 09/14/22 0948 09/14/22 1200  Weight: 67.8 kg 67.8 kg 67.4 kg    Intake/Output Summary (Last 24 hours) at 09/15/2022 0930 Last data filed at 09/15/2022 0500 Gross per 24 hour  Intake 240 ml  Output 300 ml  Net -60 ml    Additional Objective Labs: Basic Metabolic Panel: Recent Labs  Lab 09/09/22 1910 09/11/22 0048 09/11/22 0101 09/14/22 0926  NA 134* 131* 129* 132*  K 4.2 4.2 4.3 4.2  CL 96* 95* 100 96*  CO2 22 22  --  22  GLUCOSE 179* 129* 127* 211*  BUN 47* 61* 60* 50*  CREATININE 7.89* 9.43* 11.40* 8.21*  CALCIUM 8.0* 7.3*  --  7.0*  PHOS  --   --   --  4.2   Liver Function Tests: Recent Labs  Lab 09/14/22 0926  ALBUMIN 2.7*   CBC: Recent Labs  Lab 09/09/22 1910 09/11/22 0048 09/11/22 0101 09/14/22 0927  WBC 5.4 6.0  --  9.3  NEUTROABS  --  4.0  --   --   HGB 10.3* 9.5* 11.2* 9.1*  HCT 33.0* 30.2* 33.0* 30.2*  MCV 87.8 88.0  --  89.9  PLT 318 303  --  349   CBG: Recent Labs  Lab 09/13/22 2351 09/14/22 0422 09/14/22 1127 09/14/22 1707 09/15/22 0731  GLUCAP 189* 136* 140* 144* 102*   Medications:   amLODipine  10 mg Oral Daily   aspirin EC  81 mg Oral Daily   calcium  acetate  2,001 mg Oral TID with meals   carvedilol  6.25 mg Oral BID WC   Chlorhexidine Gluconate Cloth  6 each Topical Daily   Chlorhexidine Gluconate Cloth  6 each Topical Q0600   guaiFENesin  600 mg Oral BID   heparin  5,000 Units Subcutaneous Q8H   insulin aspart  0-15 Units Subcutaneous Q4H   insulin glargine-yfgn  15 Units Subcutaneous Daily   isosorbide-hydrALAZINE  1 tablet Oral TID   pantoprazole  40 mg Oral Daily   sevelamer carbonate  800 mg Oral TID WC    Dialysis Orders: Center: [facility], 4 hs, TTS, EDW 61.5 kg , 2K, 2.5 Ca, heparin 2000, Mircera 100 every 2 weeks last given 4/19, Venofer 100 last dose 4/25, cal. 0.25 mcg Access right forearm AV GG    Assessment/Plan Clotted  R FA AVGG-consulting IR for declot, unfortunately decompensation after sedation, could be related to uremia missed dialysis and  volume.  Temporary catheter placed 4/25 with dialysis overnight.  Declot graft versus PermCath placement possibly on Monday  by IR. Concerned patient may leave AMA with temp cath in place.  Will have it removed tomorrow post HD with plan for [facility] vs declot on Monday. Has appointment at 8:30 AM at [facility] Monday if leaves AMA over the weekend.  Otherwise can be completed here by IR on Monday.  Back and forth on whether or not he is going home.  ESRD -HD TTS, chronic non compliance now with clotted AVG.  HD yesterday. Next HD 4/30. Will need declot vs AVG prior. Hypertension/volume  - Bp in goal. Meds on hold - amlodipine, carvedilol and Imdur.  Volume overloaded, continue UF as tolerated.  Anemia  -last Hgb 11.2, no ESA needed currently follow-up trend Metabolic bone disease -  last corrected calcium and phos ok.  Continue binders and VDRA. Non compliance - refusing medications, delaying HD - continue to educate on importance of compliance.  Virgina Norfolk, PA-C [facility] 09/15/2022,9:30 AM  LOS: 4 days   Patient seen and examined, agree with above note with above  modifications. Now decided that he wants to go and go to [facility] in the AM-  I hope that he does  Annie Sable, MD 09/15/2022

## 2022-09-15 NOTE — Plan of Care (Signed)
  Problem: Education: Goal: Ability to describe self-care measures that may prevent or decrease complications (Diabetes Survival Skills Education) will improve Outcome: Completed/Met Goal: Individualized Educational Video(s) Outcome: Not Applicable   

## 2022-09-15 NOTE — Discharge Summary (Addendum)
Physician Discharge Summary  [name] WJX:[id number] DOB: 01-Sep-1970 DOA: 09/10/2022  PCP: [name]  Admit date: 09/10/2022 Discharge date: 09/15/2022 30 Day Unplanned Readmission Risk Score    Flowsheet Row ED to Hosp-Admission (Current) from 09/10/2022 in [facility] 43M KIDNEY UNIT  30 Day Unplanned Readmission Risk Score (%) 89.4 Filed at 09/14/2022 1200       This score is the patient's risk of an unplanned readmission within 30 days of being discharged (0 -100%). The score is based on dignosis, age, lab data, medications, orders, and past utilization.   Low:  0-14.9   Medium: 15-21.9   High: 22-29.9   Extreme: 30 and above          Admitted From: Home Disposition: Home  Recommendations for Outpatient Follow-up:  Follow up with PCP in 1-2 weeks Please obtain BMP/CBC in one week [address] [place], [place] [address] - Has an appointment time at 8:30 AM for declot vs TDC placement and needs to arrive by 7:45am  Please follow up with your PCP on the following pending results: Unresulted Labs (From admission, onward)    None         Home Health: None Equipment/Devices: None  Discharge Condition: Stable CODE STATUS: Full code Diet recommendation: Renal  Subjective: Seen and examined.  No complaints.  I was just informed by the nurse that patient now desires to go home.  Discussed with Dr. Kathrene Bongo nephrology, she informed me that patient is stable and cleared for discharge from nephrology standpoint as they have already scheduled for the patient to have his catheter declotted on Monday morning at CKD.  Address is listed above and on the discharge paperwork of the patient.  Brief/Interim Summary: This is a 52 year old male with past medical history of ESRD, HTN, HLD, homelessness, and anxiety, presented with AV fistula malfunction.  He was at hemodialysis and was unable to be dialyzed secondary to his fistula malfunction.  He was admitted under  hospital service.  AV fistula likely thrombosis/malfunction/ESRD on HD: IR on board.  They attempted to declot this for 2424 but after receiving small dose of Versed, reportedly, patient became hypoxic to the point that he was almost coding, he was given Narcan and revived.  He was hypertensive and then became normalized.  The procedure was aborted and he was sent back to the floor.  Patient eventually had temporary dialysis catheter and dialysis on 09/12/2022.  Per nephrology, next dialysis is tomorrow.  Discussed with IR and nephrology, per nephrology, he will need permanent tunneled dialysis catheter or declot of fistula before discharge, discussed with IR and they are planning to declot Monday.  As expected, patient eventually requested discharge.  Confirmed with the nephrology that patient is scheduled to go to vascular on Monday morning for the procedure.   Type 2 diabetes mellitus: Resume medications.  Essential hypertension:  home medications including amlodipine, Imdur, Coreg have been resumed, blood pressure controlled.  Please note that patient has a longstanding history of noncompliance.  Despite of multiple hospitalizations and despite of multiple counseling, patient continues to not take his medications.  According to medication reconciliation, he was not taking any of his medications.  I discussed with him and he once again did not want me to prescribe him any medications.   Addendum 09/07/2022: Patient was discharged on 09/14/2022 per his own request but after the discharge, patient had 1 episode of vomiting and he did not feel comfortable going home so he ended up  staying overnight.  Seen and examined this morning.  He feels better.  He wants to go home.  He is going to discharge in stable condition.  He is aware that he has a scheduled appointment at vascular center tomorrow for procedure.  Discharge plan was discussed with patient and/or family member and they verbalized understanding  and agreed with it.  Discharge Diagnoses:  Principal Problem:   Hemodialysis AV fistula thrombosis, initial encounter [facility]) Active Problems:   End-stage renal disease on hemodialysis (HCC)   Homelessness   Essential hypertension   DM2 (diabetes mellitus, type 2) (HCC)   Hypocalcemia   Anxiety disorder, unspecified   AV fistula occlusion (HCC)    Discharge Instructions   Allergies as of 09/15/2022       Reactions   Sulfa Antibiotics Rash   Severe rash.    Keflex [cephalexin] Other (See Comments)   Patient with severe drug reaction including exfoliating skin rash and hypotension after being prescribed both cephalexin and sulfamethoxazole-trimethoprim simultaneously. Favor SMX as much more likely culprit as patient had tolerated other cephalosporins in the past, but cannot say with complete certainty that this was not related to cephalexin.    Other Other (See Comments)   Patient states he is allergic to the TB test - unknown reaction during childhood    Bactrim [sulfamethoxazole-trimethoprim] Rash        Medication List     STOP taking these medications    doxycycline 100 MG tablet Commonly known as: VIBRA-TABS   insulin detemir 100 UNIT/ML injection Commonly known as: LEVEMIR   levofloxacin 500 MG tablet Commonly known as: Levaquin       TAKE these medications    Accu-Chek Guide test strip Generic drug: glucose blood Use to check fasting blood sugar once daily. 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 Insulin dependent   OneTouch Verio test strip Generic drug: glucose blood Use as directed up to 4 times daily   Acetaminophen Extra Strength 500 MG Tabs Take 1 tablet (500 mg total) by mouth every 8 (eight) hours as needed for mild pain.   amLODipine 10 MG tablet Commonly known as: NORVASC Take 1 tablet (10 mg total) by mouth daily.   Aspirin Low Dose 81 MG tablet Generic drug: aspirin EC Take 1 tablet (81 mg total) by mouth daily.   benzonatate 100 MG capsule Commonly  known as: TESSALON Take 1 capsule (100 mg total) by mouth every 8 (eight) hours.   calcium acetate 667 MG capsule Commonly known as: PHOSLO Take 3 capsules (2,001 mg total) by mouth 3 (three) times daily.   carvedilol 6.25 MG tablet Commonly known as: COREG Take 1 tablet (6.25 mg total) by mouth 2 (two) times daily with a meal.   Insulin Syringe-Needle U-100 25G X 1" 1 ML Misc For 4 times a day insulin SQ, 1 month supply. Diagnosis E11.65   isosorbide-hydrALAZINE 20-37.5 MG tablet Commonly known as: BIDIL Take 1 tablet by mouth 3 (three) times daily.   Lantus SoloStar 100 UNIT/ML Solostar Pen Generic drug: insulin glargine Inject 35 Units into the skin daily.Not to exceed 100 units per day.   NovoLOG FlexPen 100 UNIT/ML FlexPen Generic drug: insulin aspart Before each meal 3 times a day, 140-199 - 2 units, 200-250 - 4 units, 251-299 - 6 units,  300-349 - 8 units,  350 or above 10 units.   OneTouch Delica Plus Lancet33G Misc Use as directed up to 4 times daily.   oxyCODONE-acetaminophen 5-325 MG tablet Commonly known as: PERCOCET/ROXICET Take  1 tablet by mouth every 12 (twelve) hours as needed for severe pain.   Protonix 40 MG tablet Generic drug: pantoprazole Take 1 tablet (40 mg total) by mouth daily.   sevelamer carbonate 800 MG tablet Commonly known as: Renvela Take 2 tablets (1,600 mg total) by mouth 3 (three) times daily with meals. May also take 1 tablet (800 mg total) 2 (two) times daily as needed with snacks.   TechLite Pen Needles 32G X 4 MM Misc Generic drug: Insulin Pen Needle Use with insulin pens        Follow-up Information     [facility] Follow up in 1 week(s).                 Allergies  Allergen Reactions   Sulfa Antibiotics Rash    Severe rash.    Keflex [Cephalexin] Other (See Comments)    Patient with severe drug reaction including exfoliating skin rash and hypotension after being prescribed both cephalexin and  sulfamethoxazole-trimethoprim simultaneously. Favor SMX as much more likely culprit as patient had tolerated other cephalosporins in the past, but cannot say with complete certainty that this was not related to cephalexin.    Other Other (See Comments)    Patient states he is allergic to the TB test - unknown reaction during childhood    Bactrim [Sulfamethoxazole-Trimethoprim] Rash    Consultations: Nephrology   Procedures/Studies: IR Fluoro Guide CV Line Right  Result Date: 09/12/2022 INDICATION: Need for dialysis access EXAM: Placement of temporary hemodialysis catheter using ultrasound and fluoroscopic guidance MEDICATIONS: None ANESTHESIA/SEDATION: Local analgesia FLUOROSCOPY TIME:  Fluoroscopy Time: 0.1 minutes (1 mGy) COMPLICATIONS: None immediate. PROCEDURE: Informed written consent was obtained from the patient after a thorough discussion of the procedural risks, benefits and alternatives. All questions were addressed. Maximal Sterile Barrier Technique was utilized including caps, mask, sterile gowns, sterile gloves, sterile drape, hand hygiene and skin antiseptic. A timeout was performed prior to the initiation of the procedure. The patient was placed supine on the exam table. The right neck and chest was prepped and draped in the standard sterile fashion. Ultrasound was used to evaluate the right internal jugular vein, which was found to be widely patent. A permanent image was stored in the electronic medical record. Using ultrasound guidance, the right internal jugular vein was directly punctured using a 21 gauge micropuncture set. Access site was serially dilated to accommodate an 035 wire, which was advanced into the IVC. Subsequently, serial tract dilation was performed and a 13 French temporary triple-lumen hemodialysis catheter was advanced into the central veins, such that the tip overlies the superior cavoatrial junction. The catheter was found to aspirate and flush appropriately. It  was locked with the appropriate volume of heparinized saline. It was secured to the skin using silk suture and a sterile dressing. The patient tolerated the procedure well without immediate complication. IMPRESSION: Successful placement of a temporary triple-lumen hemodialysis catheter via the right internal jugular vein. The line is ready for immediate use. Electronically Signed   By: Olive Bass M.D.   On: 09/12/2022 15:27   IR US Guide Vasc Access Right  Result Date: 09/12/2022 INDICATION: Need for dialysis access EXAM: Placement of temporary hemodialysis catheter using ultrasound and fluoroscopic guidance MEDICATIONS: None ANESTHESIA/SEDATION: Local analgesia FLUOROSCOPY TIME:  Fluoroscopy Time: 0.1 minutes (1 mGy) COMPLICATIONS: None immediate. PROCEDURE: Informed written consent was obtained from the patient after a thorough discussion of the procedural risks, benefits and alternatives. All questions were addressed. Maximal Sterile Barrier Technique  was utilized including caps, mask, sterile gowns, sterile gloves, sterile drape, hand hygiene and skin antiseptic. A timeout was performed prior to the initiation of the procedure. The patient was placed supine on the exam table. The right neck and chest was prepped and draped in the standard sterile fashion. Ultrasound was used to evaluate the right internal jugular vein, which was found to be widely patent. A permanent image was stored in the electronic medical record. Using ultrasound guidance, the right internal jugular vein was directly punctured using a 21 gauge micropuncture set. Access site was serially dilated to accommodate an 035 wire, which was advanced into the IVC. Subsequently, serial tract dilation was performed and a 13 French temporary triple-lumen hemodialysis catheter was advanced into the central veins, such that the tip overlies the superior cavoatrial junction. The catheter was found to aspirate and flush appropriately. It was locked  with the appropriate volume of heparinized saline. It was secured to the skin using silk suture and a sterile dressing. The patient tolerated the procedure well without immediate complication. IMPRESSION: Successful placement of a temporary triple-lumen hemodialysis catheter via the right internal jugular vein. The line is ready for immediate use. Electronically Signed   By: Olive Bass M.D.   On: 09/12/2022 15:27   IR THROMBECTOMY AV FISTULA W/THROMBOLYSIS INC/SHUNT/IMG RIGHT  Result Date: 09/12/2022 INDICATION: 51 year old male presents for thrombectomy of occluded right upper extremity dialysis circuit EXAM: ULTRASOUND-GUIDED ACCESS RIGHT UPPER EXTREMITY DIALYSIS CIRCUIT INTENTION FOR THROMBECTOMY OF DIALYSIS CIRCUIT DISCONTINUATION OF PROCEDURE MEDICATIONS: None. ANESTHESIA/SEDATION: Moderate (conscious) sedation was employed during this procedure. A total of Versed 1.0 mg and Fentanyl 50 mcg was administered intravenously by the radiology nurse. Total intra-service moderate Sedation Time: 10 minutes. The patient's level of consciousness and vital signs were monitored continuously by radiology nursing throughout the procedure under my direct supervision. FLUOROSCOPY: Radiation Exposure Index (as provided by the fluoroscopic device): 0 mGy Kerma COMPLICATIONS: SIR category C PROCEDURE: The procedure, risks, benefits, and alternatives were explained to the patient and the patient's family, including bleeding, infection, arterial thrombus, venous thrombus, vessel injury, need for further procedure, need for stenting, contrast reaction, need for catheter placement, cardiopulmonary collapse, death. Questions regarding the procedure were encouraged and answered. The patient understands and consents to the procedure. Ultrasound survey was performed with images stored and sent to PACs. The right upper extremity was prepped and draped in the usual sterile fashion. Moderate sedation was initiated Ultrasound  guidance was used to access the upper extremity graft with a micropuncture kit. At this point during the procedure, the patient became unresponsive and respiratory distress was treated with airway management and narcan reversal of the fentanyl. Once the patient was arousable and vital signs were stabilized, we withdrew from the case. No significant blood loss was encountered. IMPRESSION: Attempt at right upper extremity dialysis circuit thrombectomy was aborted secondary to respiratory distress. ACCESS: The team will need to make arrangements for treatment after interval care. Electronically Signed   By: Gilmer Mor D.O.   On: 09/12/2022 08:50   IR US Guide Vasc Access Right  Result Date: 09/12/2022 INDICATION: 52 year old male presents for thrombectomy of occluded right upper extremity dialysis circuit EXAM: ULTRASOUND-GUIDED ACCESS RIGHT UPPER EXTREMITY DIALYSIS CIRCUIT INTENTION FOR THROMBECTOMY OF DIALYSIS CIRCUIT DISCONTINUATION OF PROCEDURE MEDICATIONS: None. ANESTHESIA/SEDATION: Moderate (conscious) sedation was employed during this procedure. A total of Versed 1.0 mg and Fentanyl 50 mcg was administered intravenously by the radiology nurse. Total intra-service moderate Sedation Time: 10 minutes. The patient's level  of consciousness and vital signs were monitored continuously by radiology nursing throughout the procedure under my direct supervision. FLUOROSCOPY: Radiation Exposure Index (as provided by the fluoroscopic device): 0 mGy Kerma COMPLICATIONS: SIR category C PROCEDURE: The procedure, risks, benefits, and alternatives were explained to the patient and the patient's family, including bleeding, infection, arterial thrombus, venous thrombus, vessel injury, need for further procedure, need for stenting, contrast reaction, need for catheter placement, cardiopulmonary collapse, death. Questions regarding the procedure were encouraged and answered. The patient understands and consents to the  procedure. Ultrasound survey was performed with images stored and sent to PACs. The right upper extremity was prepped and draped in the usual sterile fashion. Moderate sedation was initiated Ultrasound guidance was used to access the upper extremity graft with a micropuncture kit. At this point during the procedure, the patient became unresponsive and respiratory distress was treated with airway management and narcan reversal of the fentanyl. Once the patient was arousable and vital signs were stabilized, we withdrew from the case. No significant blood loss was encountered. IMPRESSION: Attempt at right upper extremity dialysis circuit thrombectomy was aborted secondary to respiratory distress. ACCESS: The team will need to make arrangements for treatment after interval care. Electronically Signed   By: Gilmer Mor D.O.   On: 09/12/2022 08:50   DG Chest Portable 1 View  Result Date: 09/11/2022 CLINICAL DATA:  Shortness of breath. EXAM: PORTABLE CHEST 1 VIEW COMPARISON:  September 09, 2022 FINDINGS: The cardiac silhouette is markedly enlarged and unchanged in size. Mildly increased perihilar interstitial lung markings are seen. This is increased in severity when compared to the prior study. The area of right middle lobe consolidation seen on the prior study is no longer visualized. There is no evidence of a pleural effusion or pneumothorax. The visualized skeletal structures are unremarkable. IMPRESSION: Stable cardiomegaly with findings consistent with mild interstitial edema. Electronically Signed   By: Aram Candela M.D.   On: 09/11/2022 01:05   DG Chest 2 View  Result Date: 09/09/2022 CLINICAL DATA:  Cough EXAM: CHEST - 2 VIEW COMPARISON:  Chest x-ray dated September 08, 2022; chest x-ray dated June 09, 2022; chest CT dated May 24, 2022 FINDINGS: Unchanged cardiomegaly. Unchanged right middle lobe consolidation. No new lung opacity. Redemonstrated fracture of the distal left clavicle. No evidence of  pleural effusion or pneumothorax. IMPRESSION: 1. No evidence of acute airspace opacity. 2. Right middle lobe consolidation, similar when compared with multiple prior exams. Given chronicity, consider CT for better evaluation. 3. Redemonstrated fracture of the distal left clavicle. 4. Unchanged cardiomegaly. Electronically Signed   By: Allegra Lai M.D.   On: 09/09/2022 20:06   DG Chest 2 View  Result Date: 09/08/2022 CLINICAL DATA:  Cough, dyspnea EXAM: CHEST - 2 VIEW COMPARISON:  09/07/2022 FINDINGS: Stable pulmonary insufflation. No pneumothorax or pleural effusion. Stable asymmetric right basilar consolidation. Stable moderate cardiomegaly. Unchanged central pulmonary vascular congestion. No acute bone abnormality. IMPRESSION: 1. Stable pulmonary insufflation. 2. Stable asymmetric right basilar consolidation, asymmetric edema or infection. 3. Stable moderate cardiomegaly with central pulmonary vascular congestion. Electronically Signed   By: Helyn Numbers M.D.   On: 09/08/2022 20:34   DG Chest Port 1 View  Result Date: 09/07/2022 CLINICAL DATA:  Questionable sepsis EXAM: PORTABLE CHEST 1 VIEW COMPARISON:  09/05/2022 FINDINGS: Cardiomegaly with vascular congestion. Bilateral perihilar and lower lobe opacities could reflect edema or pneumonia. No effusions. No acute bony abnormality. IMPRESSION: Cardiomegaly, vascular congestion. Perihilar and lower lobe opacities bilaterally could reflect edema or  infection. Electronically Signed   By: Charlett Nose M.D.   On: 09/07/2022 01:12   DG Chest 2 View  Result Date: 09/05/2022 CLINICAL DATA:  Shortness of breath. EXAM: CHEST - 2 VIEW COMPARISON:  Multiple prior exams most recently 08/24/2022 FINDINGS: Chronic cardiomegaly. Stable mediastinal contours. Similar vascular congestion. Small left pleural effusion. No acute airspace disease. No pneumothorax. Unchanged distal left clavicle fracture. IMPRESSION: Stable cardiomegaly, vascular congestion, and small  left pleural effusion. Electronically Signed   By: Narda Rutherford M.D.   On: 09/05/2022 23:32   DG Chest 2 View  Result Date: 08/24/2022 CLINICAL DATA:  Chest pain and cough. EXAM: CHEST - 2 VIEW COMPARISON:  July 19, 2022 FINDINGS: The cardiac silhouette is markedly enlarged and unchanged in size. Mild, stable perihilar pulmonary vascular prominence is seen. There is no evidence of focal consolidation, pleural effusion or pneumothorax. A subacute fracture deformity is seen involving the distal left clavicle. IMPRESSION: 1. Stable cardiomegaly with chronic central vascular congestion. 2. Subacute fracture of the distal left clavicle. Electronically Signed   By: Aram Candela M.D.   On: 08/24/2022 21:22     Discharge Exam: Vitals:   09/14/22 1712 09/14/22 1802  BP: (!) 104/59 111/68  Pulse: 75   Resp: 18   Temp: 98 F (36.7 C)   SpO2: 91%    Vitals:   09/14/22 1200 09/14/22 1708 09/14/22 1712 09/14/22 1802  BP:  (!) 104/59 (!) 104/59 111/68  Pulse:  75 75   Resp:  18 18   Temp:  98 F (36.7 C) 98 F (36.7 C)   TempSrc:   Axillary   SpO2:  (!) 77% 91%   Weight: 67.4 kg     Height:        General: Pt is alert, awake, not in acute distress Cardiovascular: RRR, S1/S2 +, no rubs, no gallops Respiratory: CTA bilaterally, no wheezing, no rhonchi Abdominal: Soft, NT, ND, bowel sounds + Extremities: no edema, no cyanosis    The results of significant diagnostics from this hospitalization (including imaging, microbiology, ancillary and laboratory) are listed below for reference.     Microbiology: Recent Results (from the past 240 hour(s))  Blood Culture (routine x 2)     Status: None   Collection Time: 09/07/22 12:00 AM   Specimen: BLOOD LEFT ARM  Result Value Ref Range Status   Specimen Description BLOOD LEFT ARM  Final   Special Requests   Final    BOTTLES DRAWN AEROBIC AND ANAEROBIC Blood Culture adequate volume   Culture   Final    NO GROWTH 5 DAYS Performed at  [facility], [address], [place], [place] [address]    Report Status 09/12/2022 FINAL  Final  Blood Culture (routine x 2)     Status: None   Collection Time: 09/07/22 12:17 AM   Specimen: BLOOD LEFT HAND  Result Value Ref Range Status   Specimen Description BLOOD LEFT HAND  Final   Special Requests   Final    BOTTLES DRAWN AEROBIC ONLY Blood Culture adequate volume   Culture   Final    NO GROWTH 5 DAYS Performed at [facility], [address], [place], [place] [address]    Report Status 09/12/2022 FINAL  Final     Labs: BNP (last 3 results) Recent Labs    05/25/22 0820 08/24/22 2151 09/08/22 0340  BNP 3,829.2* >4,500.0* >4,500.0*   Basic Metabolic Panel: Recent Labs  Lab 09/09/22 1910 09/11/22 0048 09/11/22  0101 09/14/22 0926  NA 134* 131* 129* 132*  K 4.2 4.2 4.3 4.2  CL 96* 95* 100 96*  CO2 22 22  --  22  GLUCOSE 179* 129* 127* 211*  BUN 47* 61* 60* 50*  CREATININE 7.89* 9.43* 11.40* 8.21*  CALCIUM 8.0* 7.3*  --  7.0*  MG  --  2.0  --   --   PHOS  --   --   --  4.2   Liver Function Tests: Recent Labs  Lab 09/14/22 0926  ALBUMIN 2.7*   No results for input(s): "LIPASE", "AMYLASE" in the last 168 hours. No results for input(s): "AMMONIA" in the last 168 hours. CBC: Recent Labs  Lab 09/09/22 1910 09/11/22 0048 09/11/22 0101 09/14/22 0927  WBC 5.4 6.0  --  9.3  NEUTROABS  --  4.0  --   --   HGB 10.3* 9.5* 11.2* 9.1*  HCT 33.0* 30.2* 33.0* 30.2*  MCV 87.8 88.0  --  89.9  PLT 318 303  --  349   Cardiac Enzymes: No results for input(s): "CKTOTAL", "CKMB", "CKMBINDEX", "TROPONINI" in the last 168 hours. BNP: Invalid input(s): "POCBNP" CBG: Recent Labs  Lab 09/13/22 2351 09/14/22 0422 09/14/22 1127 09/14/22 1707 09/15/22 0731  GLUCAP 189* 136* 140* 144* 102*   D-Dimer No results for input(s): "DDIMER" in the last 72 hours. Hgb A1c No results for input(s): "HGBA1C" in the last 72 hours. Lipid Profile No results for  input(s): "CHOL", "HDL", "LDLCALC", "TRIG", "CHOLHDL", "LDLDIRECT" in the last 72 hours. Thyroid function studies No results for input(s): "TSH", "T4TOTAL", "T3FREE", "THYROIDAB" in the last 72 hours.  Invalid input(s): "FREET3" Anemia work up No results for input(s): "VITAMINB12", "FOLATE", "FERRITIN", "TIBC", "IRON", "RETICCTPCT" in the last 72 hours. Urinalysis    Component Value Date/Time   COLORURINE YELLOW 02/19/2021 0045   APPEARANCEUR HAZY (A) 02/19/2021 0045   APPEARANCEUR Clear 05/09/2020 1509   LABSPEC 1.013 02/19/2021 0045   PHURINE 5.0 02/19/2021 0045   GLUCOSEU >=500 (A) 02/19/2021 0045   HGBUR MODERATE (A) 02/19/2021 0045   BILIRUBINUR NEGATIVE 02/19/2021 0045   BILIRUBINUR Negative 05/09/2020 1509   KETONESUR NEGATIVE 02/19/2021 0045   PROTEINUR >=300 (A) 02/19/2021 0045   UROBILINOGEN 0.2 02/04/2020 1612   UROBILINOGEN 0.2 02/16/2014 0045   NITRITE NEGATIVE 02/19/2021 0045   LEUKOCYTESUR NEGATIVE 02/19/2021 0045   Sepsis Labs Recent Labs  Lab 09/09/22 1910 09/11/22 0048 09/14/22 0927  WBC 5.4 6.0 9.3   Microbiology Recent Results (from the past 240 hour(s))  Blood Culture (routine x 2)     Status: None   Collection Time: 09/07/22 12:00 AM   Specimen: BLOOD LEFT ARM  Result Value Ref Range Status   Specimen Description BLOOD LEFT ARM  Final   Special Requests   Final    BOTTLES DRAWN AEROBIC AND ANAEROBIC Blood Culture adequate volume   Culture   Final    NO GROWTH 5 DAYS Performed at [facility], [address], [place], [place] [address]    Report Status 09/12/2022 FINAL  Final  Blood Culture (routine x 2)     Status: None   Collection Time: 09/07/22 12:17 AM   Specimen: BLOOD LEFT HAND  Result Value Ref Range Status   Specimen Description BLOOD LEFT HAND  Final   Special Requests   Final    BOTTLES DRAWN AEROBIC ONLY Blood Culture adequate volume   Culture   Final    NO GROWTH 5 DAYS Performed at [facility]  Hospital Lab, [address] [place], [place] [address]    Report Status 09/12/2022 FINAL  Final     Time coordinating discharge: Over 30 minutes  SIGNED:   Hughie Closs, MD  [facility] 09/15/2022, 10:50 AM *Please note that this is a verbal dictation therefore any spelling or grammatical errors are due to the "Dragon Medical One" system interpretation. If 7PM-7AM, please contact night-coverage [email or web address]

## 2022-09-16 ENCOUNTER — Other Ambulatory Visit: Payer: Self-pay

## 2022-09-16 DIAGNOSIS — Z992 Dependence on renal dialysis: Secondary | ICD-10-CM | POA: Diagnosis not present

## 2022-09-16 DIAGNOSIS — N186 End stage renal disease: Secondary | ICD-10-CM | POA: Diagnosis not present

## 2022-09-16 DIAGNOSIS — E1122 Type 2 diabetes mellitus with diabetic chronic kidney disease: Secondary | ICD-10-CM | POA: Diagnosis not present

## 2022-09-16 NOTE — Progress Notes (Signed)
Late Note Entry  Pt was d/c yesterday. Contacted [facility] to advise clinic of pt's d/c date and that pt to f/u with [facility] today. Clinic advised that pt will hopefully come to appt tomorrow.   Olivia Canter Renal Navigator [phone]

## 2022-09-17 ENCOUNTER — Emergency Department (HOSPITAL_COMMUNITY): Payer: Medicaid Other

## 2022-09-17 ENCOUNTER — Other Ambulatory Visit: Payer: Self-pay

## 2022-09-17 ENCOUNTER — Emergency Department (HOSPITAL_COMMUNITY)
Admission: EM | Admit: 2022-09-17 | Discharge: 2022-09-18 | Discharge status: Against Medical Advice | Payer: Medicaid Other | Attending: Emergency Medicine | Admitting: Emergency Medicine

## 2022-09-17 ENCOUNTER — Telehealth: Payer: Self-pay | Admitting: Physician Assistant

## 2022-09-17 DIAGNOSIS — R059 Cough, unspecified: Secondary | ICD-10-CM | POA: Insufficient documentation

## 2022-09-17 DIAGNOSIS — N186 End stage renal disease: Secondary | ICD-10-CM | POA: Diagnosis not present

## 2022-09-17 DIAGNOSIS — E1122 Type 2 diabetes mellitus with diabetic chronic kidney disease: Secondary | ICD-10-CM | POA: Diagnosis not present

## 2022-09-17 DIAGNOSIS — R600 Localized edema: Secondary | ICD-10-CM | POA: Diagnosis not present

## 2022-09-17 DIAGNOSIS — Z992 Dependence on renal dialysis: Secondary | ICD-10-CM | POA: Insufficient documentation

## 2022-09-17 DIAGNOSIS — J811 Chronic pulmonary edema: Secondary | ICD-10-CM | POA: Diagnosis not present

## 2022-09-17 LAB — CBC WITH DIFFERENTIAL/PLATELET
Abs Immature Granulocytes: 0.08 10*3/uL — ABNORMAL HIGH (ref 0.00–0.07)
Basophils Absolute: 0 10*3/uL (ref 0.0–0.1)
Basophils Relative: 0 %
Eosinophils Absolute: 0.2 10*3/uL (ref 0.0–0.5)
Eosinophils Relative: 2 %
HCT: 31.4 % — ABNORMAL LOW (ref 39.0–52.0)
Hemoglobin: 9.8 g/dL — ABNORMAL LOW (ref 13.0–17.0)
Immature Granulocytes: 1 %
Lymphocytes Relative: 12 %
Lymphs Abs: 1.1 10*3/uL (ref 0.7–4.0)
MCH: 27.2 pg (ref 26.0–34.0)
MCHC: 31.2 g/dL (ref 30.0–36.0)
MCV: 87.2 fL (ref 80.0–100.0)
Monocytes Absolute: 1 10*3/uL (ref 0.1–1.0)
Monocytes Relative: 10 %
Neutro Abs: 7 10*3/uL (ref 1.7–7.7)
Neutrophils Relative %: 75 %
Platelets: 535 10*3/uL — ABNORMAL HIGH (ref 150–400)
RBC: 3.6 MIL/uL — ABNORMAL LOW (ref 4.22–5.81)
RDW: 16.5 % — ABNORMAL HIGH (ref 11.5–15.5)
WBC: 9.4 10*3/uL (ref 4.0–10.5)
nRBC: 0 % (ref 0.0–0.2)

## 2022-09-17 LAB — BASIC METABOLIC PANEL
Anion gap: 14 (ref 5–15)
BUN: 74 mg/dL — ABNORMAL HIGH (ref 6–20)
CO2: 22 mmol/L (ref 22–32)
Calcium: 6.9 mg/dL — ABNORMAL LOW (ref 8.9–10.3)
Chloride: 101 mmol/L (ref 98–111)
Creatinine, Ser: 10.68 mg/dL — ABNORMAL HIGH (ref 0.61–1.24)
GFR, Estimated: 5 mL/min — ABNORMAL LOW (ref >60)
Glucose, Bld: 185 mg/dL — ABNORMAL HIGH (ref 70–99)
Potassium: 4.3 mmol/L (ref 3.5–5.1)
Sodium: 137 mmol/L (ref 135–145)

## 2022-09-17 MED ORDER — IPRATROPIUM-ALBUTEROL 0.5-2.5 (3) MG/3ML IN SOLN
3.0000 mL | Freq: Once | RESPIRATORY_TRACT | Status: AC
  Administered 2022-09-18: 3 mL via RESPIRATORY_TRACT
  Filled 2022-09-17: qty 3

## 2022-09-17 NOTE — Telephone Encounter (Signed)
Transition of care contact from inpatient facility  Date of Discharge: 09/15/22 Date of Contact: 09/17/22 Method of contact: Phone  Attempted to contact patient to discuss transition of care from inpatient admission. Patient did not go to [facility] or hemodialysis after discharge. He says [facility] told him he needs to have procedure completed in the hospital. Called his dialysis center. We are trying to clarify if [facility] will reschedule his procedure or if we can get him and IR appointment for tomorrow.  12/56: Talked to Dr. Glenna Fellows at [facility]. Pt missed appointment yesterday, had acute decompensation when IR started sedation here on 09/11/22. He had a temp cath placed but only received 55 min of HD on 09/14/22 because he signed off early. At this point, he has missed another full day of dialysis putting him at increased risk of hyperkalemia and respiratory failure. Safest option is to return to ED in case further complications arise during vascular procedure. Called patient and left message with this information. Also called his dialysis center and told charge nurse.  Rogers Blocker, PA-C 09/17/2022, 12:55 PM  [facility] Pager: [phone]

## 2022-09-17 NOTE — ED Provider Triage Note (Signed)
Emergency Medicine Provider Triage Evaluation Note  [name] , a 52 y.o. male  was evaluated in triage.  Pt complains of requesting temporary hemodialysis catheter placement.  States his fistula has not been working for a few days.  Had a temporary catheter placed for his last hemodialysis session but was unable to finish the session due to his cough.  States he was told by someone in the hospital to come to the ED for placement of a temporary catheter.  Review of Systems  Positive: As above Negative: As above  Physical Exam  BP (!) 184/97 (BP Location: Left Arm)   Pulse 95   Temp 98.8 F (37.1 C)   Resp 16   Ht 5\' 7"  (1.702 m)   Wt 67.6 kg   SpO2 94%   BMI 23.34 kg/m  Gen:   Awake, no distress   Resp:  Normal effort  MSK:   Moves extremities without difficulty  Other:    Medical Decision Making  Medically screening exam initiated at 9:07 PM.  Appropriate orders placed.  [name] was informed that the remainder of the evaluation will be completed by another provider, this initial triage assessment does not replace that evaluation, and the importance of remaining in the ED until their evaluation is complete.  Basic labs ordered   Mora Bellman 09/17/22 2108
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
## 2022-09-17 NOTE — ED Triage Notes (Signed)
Pt via POV stating he needs a hemodialysis catheter because his R-sided fistula is not working. HD schedule T/Th/Sat, completed 30 mins here on Saturday via temp cath that malfunctioned every time he coughed, so pt requested termination of treatment at that time. He did not have HD today. Pt reports leg swelling and persistent cough.

## 2022-09-18 ENCOUNTER — Other Ambulatory Visit: Payer: Self-pay

## 2022-09-18 NOTE — ED Notes (Addendum)
Pt refuse to let this tech get updated set of vital signs

## 2022-09-18 NOTE — Discharge Instructions (Signed)
You are leaving against our advice.  You do not need dialysis.  Call [place] [facility] to help get access arranged.

## 2022-09-18 NOTE — ED Notes (Signed)
Pt non-compliant with nasal cannula at this time.  This RN has replaced [place] multiple times.

## 2022-09-18 NOTE — ED Notes (Signed)
Pt refuse to let Tech take a set of vitals.

## 2022-09-18 NOTE — ED Notes (Signed)
Pt noncompliant with vital sign re-assessment.  Pt has been able to ambulate to and from restroom multiple times.

## 2022-09-18 NOTE — ED Provider Notes (Signed)
  Physical Exam  BP (!) 178/90   Pulse (!) 104   Temp 98.8 F (37.1 C)   Resp 16   Ht 5\' 7"  (1.702 m)   Wt 67.6 kg   SpO2 98%   BMI 23.34 kg/m   Physical Exam  Procedures  Procedures  ED Course / MDM   Clinical Course as of 09/18/22 0932  Tue Sep 17, 2022  2349 Patient with over 30 ER visits in the past 6 months.  Presents today because he was told to come to the ER for dialysis.  There is no hyperkalemia.  However he is coughing throughout exam, will get x-ray.  He has a nonfunctioning access to right arm.  Will consult nephrology but will likely need to be admitted to have catheter placed [DW]  Wed Sep 18, 2022  0003 D/w dr Ronalee Belts Reports that patient will likely need to see IR in the morning to have a catheter placed.  He will endorse this to the morning nephrology team [DW]  0216 Patient resting comfortably, no acute distress.  He did have brief drop in his pulse ox while sleeping, but this is improved. [DW]  [phone] Patient stable throughout the night awaiting dialysis catheter placement [DW]  [phone] Signed out to dr Insiya Oshea at shift change to call IR  [DW]    Clinical Course User Index [DW] Zadie Rhine, MD   Medical Decision Making Amount and/or Complexity of Data Reviewed Radiology: ordered.  Risk Prescription drug management.   Received patient in signout.  Needs dialysis access since he has not and has not done full dialysis.  Pending access by IR.  Would need to be done in the hospital due to previous complicated placement.  Also would need dialysis after.  However patient now unwilling to stay.  Awake and appropriate.  Aware of risk including death..  States he has things to do and will come back.  Alerted interventional radiology about patient leaving and will discuss with nephrology.       Benjiman Core, MD 09/18/22 [phone]

## 2022-09-18 NOTE — ED Notes (Addendum)
This RN noticed pt's O2 saturation alarming for 72%.  This RN stepped in room to check on pt.  Pt sleeping on stomach.  Pt placed on 3L [place] to assist with O2 with improvement to 99-100%.  Pt removed all monitoring equipment except for pulse ox.

## 2022-10-18 ENCOUNTER — Other Ambulatory Visit (HOSPITAL_COMMUNITY): Payer: Self-pay

## 2022-10-20 ENCOUNTER — Other Ambulatory Visit: Payer: Self-pay

## 2022-10-20 ENCOUNTER — Emergency Department (HOSPITAL_COMMUNITY): Payer: Medicaid Other

## 2022-10-20 ENCOUNTER — Inpatient Hospital Stay (HOSPITAL_COMMUNITY)
Admission: EM | Admit: 2022-10-20 | Discharge: 2022-10-26 | DRG: 314 | Disposition: A | Discharge status: Skilled Nursing Facility | Payer: Medicaid Other | Attending: Internal Medicine | Admitting: Internal Medicine

## 2022-10-20 ENCOUNTER — Encounter (HOSPITAL_COMMUNITY): Payer: Self-pay | Admitting: Emergency Medicine

## 2022-10-20 DIAGNOSIS — I10 Essential (primary) hypertension: Secondary | ICD-10-CM

## 2022-10-20 DIAGNOSIS — I428 Other cardiomyopathies: Secondary | ICD-10-CM | POA: Diagnosis present

## 2022-10-20 DIAGNOSIS — E1122 Type 2 diabetes mellitus with diabetic chronic kidney disease: Secondary | ICD-10-CM | POA: Diagnosis present

## 2022-10-20 DIAGNOSIS — Z882 Allergy status to sulfonamides status: Secondary | ICD-10-CM

## 2022-10-20 DIAGNOSIS — Z8614 Personal history of Methicillin resistant Staphylococcus aureus infection: Secondary | ICD-10-CM

## 2022-10-20 DIAGNOSIS — Z635 Disruption of family by separation and divorce: Secondary | ICD-10-CM

## 2022-10-20 DIAGNOSIS — Z881 Allergy status to other antibiotic agents status: Secondary | ICD-10-CM

## 2022-10-20 DIAGNOSIS — I76 Septic arterial embolism: Secondary | ICD-10-CM | POA: Diagnosis present

## 2022-10-20 DIAGNOSIS — E8729 Other acidosis: Secondary | ICD-10-CM | POA: Diagnosis not present

## 2022-10-20 DIAGNOSIS — G4700 Insomnia, unspecified: Secondary | ICD-10-CM | POA: Diagnosis present

## 2022-10-20 DIAGNOSIS — Z91148 Patient's other noncompliance with medication regimen for other reason: Secondary | ICD-10-CM

## 2022-10-20 DIAGNOSIS — L02414 Cutaneous abscess of left upper limb: Secondary | ICD-10-CM | POA: Diagnosis present

## 2022-10-20 DIAGNOSIS — I120 Hypertensive chronic kidney disease with stage 5 chronic kidney disease or end stage renal disease: Secondary | ICD-10-CM | POA: Diagnosis not present

## 2022-10-20 DIAGNOSIS — M25561 Pain in right knee: Secondary | ICD-10-CM | POA: Diagnosis not present

## 2022-10-20 DIAGNOSIS — Z862 Personal history of diseases of the blood and blood-forming organs and certain disorders involving the immune mechanism: Secondary | ICD-10-CM

## 2022-10-20 DIAGNOSIS — N186 End stage renal disease: Secondary | ICD-10-CM

## 2022-10-20 DIAGNOSIS — D631 Anemia in chronic kidney disease: Secondary | ICD-10-CM | POA: Diagnosis present

## 2022-10-20 DIAGNOSIS — Z794 Long term (current) use of insulin: Secondary | ICD-10-CM

## 2022-10-20 DIAGNOSIS — E872 Acidosis, unspecified: Secondary | ICD-10-CM | POA: Diagnosis present

## 2022-10-20 DIAGNOSIS — R7881 Bacteremia: Secondary | ICD-10-CM | POA: Diagnosis not present

## 2022-10-20 DIAGNOSIS — I152 Hypertension secondary to endocrine disorders: Secondary | ICD-10-CM | POA: Diagnosis present

## 2022-10-20 DIAGNOSIS — I447 Left bundle-branch block, unspecified: Secondary | ICD-10-CM | POA: Diagnosis present

## 2022-10-20 DIAGNOSIS — R079 Chest pain, unspecified: Secondary | ICD-10-CM | POA: Diagnosis not present

## 2022-10-20 DIAGNOSIS — Z887 Allergy status to serum and vaccine status: Secondary | ICD-10-CM

## 2022-10-20 DIAGNOSIS — Z992 Dependence on renal dialysis: Secondary | ICD-10-CM | POA: Diagnosis not present

## 2022-10-20 DIAGNOSIS — B9562 Methicillin resistant Staphylococcus aureus infection as the cause of diseases classified elsewhere: Secondary | ICD-10-CM | POA: Diagnosis present

## 2022-10-20 DIAGNOSIS — E785 Hyperlipidemia, unspecified: Secondary | ICD-10-CM | POA: Diagnosis present

## 2022-10-20 DIAGNOSIS — S80919A Unspecified superficial injury of unspecified knee, initial encounter: Secondary | ICD-10-CM | POA: Diagnosis not present

## 2022-10-20 DIAGNOSIS — I5043 Acute on chronic combined systolic (congestive) and diastolic (congestive) heart failure: Secondary | ICD-10-CM | POA: Diagnosis present

## 2022-10-20 DIAGNOSIS — M25521 Pain in right elbow: Secondary | ICD-10-CM | POA: Diagnosis not present

## 2022-10-20 DIAGNOSIS — Z91158 Patient's noncompliance with renal dialysis for other reason: Secondary | ICD-10-CM

## 2022-10-20 DIAGNOSIS — W19XXXA Unspecified fall, initial encounter: Secondary | ICD-10-CM | POA: Diagnosis not present

## 2022-10-20 DIAGNOSIS — Z79899 Other long term (current) drug therapy: Secondary | ICD-10-CM

## 2022-10-20 DIAGNOSIS — W010XXA Fall on same level from slipping, tripping and stumbling without subsequent striking against object, initial encounter: Secondary | ICD-10-CM | POA: Diagnosis present

## 2022-10-20 DIAGNOSIS — Z8249 Family history of ischemic heart disease and other diseases of the circulatory system: Secondary | ICD-10-CM

## 2022-10-20 DIAGNOSIS — T827XXA Infection and inflammatory reaction due to other cardiac and vascular devices, implants and grafts, initial encounter: Principal | ICD-10-CM

## 2022-10-20 DIAGNOSIS — N189 Chronic kidney disease, unspecified: Secondary | ICD-10-CM

## 2022-10-20 DIAGNOSIS — Z5986 Financial insecurity: Secondary | ICD-10-CM

## 2022-10-20 DIAGNOSIS — I132 Hypertensive heart and chronic kidney disease with heart failure and with stage 5 chronic kidney disease, or end stage renal disease: Secondary | ICD-10-CM | POA: Diagnosis present

## 2022-10-20 DIAGNOSIS — I3139 Other pericardial effusion (noninflammatory): Secondary | ICD-10-CM | POA: Diagnosis present

## 2022-10-20 DIAGNOSIS — E119 Type 2 diabetes mellitus without complications: Secondary | ICD-10-CM

## 2022-10-20 DIAGNOSIS — Z5900 Homelessness unspecified: Secondary | ICD-10-CM

## 2022-10-20 DIAGNOSIS — Z23 Encounter for immunization: Secondary | ICD-10-CM

## 2022-10-20 DIAGNOSIS — S59909A Unspecified injury of unspecified elbow, initial encounter: Secondary | ICD-10-CM | POA: Diagnosis not present

## 2022-10-20 DIAGNOSIS — M898X9 Other specified disorders of bone, unspecified site: Secondary | ICD-10-CM | POA: Diagnosis present

## 2022-10-20 DIAGNOSIS — K219 Gastro-esophageal reflux disease without esophagitis: Secondary | ICD-10-CM | POA: Diagnosis present

## 2022-10-20 DIAGNOSIS — Y832 Surgical operation with anastomosis, bypass or graft as the cause of abnormal reaction of the patient, or of later complication, without mention of misadventure at the time of the procedure: Secondary | ICD-10-CM | POA: Diagnosis present

## 2022-10-20 DIAGNOSIS — Z5982 Transportation insecurity: Secondary | ICD-10-CM

## 2022-10-20 LAB — HEPATITIS B SURFACE ANTIGEN: Hepatitis B Surface Ag: NONREACTIVE

## 2022-10-20 LAB — BASIC METABOLIC PANEL
Anion gap: 18 — ABNORMAL HIGH (ref 5–15)
BUN: 60 mg/dL — ABNORMAL HIGH (ref 6–20)
CO2: 21 mmol/L — ABNORMAL LOW (ref 22–32)
Calcium: 8.1 mg/dL — ABNORMAL LOW (ref 8.9–10.3)
Chloride: 97 mmol/L — ABNORMAL LOW (ref 98–111)
Creatinine, Ser: 9.49 mg/dL — ABNORMAL HIGH (ref 0.61–1.24)
GFR, Estimated: 6 mL/min — ABNORMAL LOW (ref >60)
Glucose, Bld: 151 mg/dL — ABNORMAL HIGH (ref 70–99)
Potassium: 5 mmol/L (ref 3.5–5.1)
Sodium: 136 mmol/L (ref 135–145)

## 2022-10-20 LAB — CBC WITH DIFFERENTIAL/PLATELET
Abs Immature Granulocytes: 0.06 10*3/uL (ref 0.00–0.07)
Basophils Absolute: 0.1 10*3/uL (ref 0.0–0.1)
Basophils Relative: 1 %
Eosinophils Absolute: 0.3 10*3/uL (ref 0.0–0.5)
Eosinophils Relative: 4 %
HCT: 28.7 % — ABNORMAL LOW (ref 39.0–52.0)
Hemoglobin: 8.4 g/dL — ABNORMAL LOW (ref 13.0–17.0)
Immature Granulocytes: 1 %
Lymphocytes Relative: 13 %
Lymphs Abs: 1.1 10*3/uL (ref 0.7–4.0)
MCH: 26.6 pg (ref 26.0–34.0)
MCHC: 29.3 g/dL — ABNORMAL LOW (ref 30.0–36.0)
MCV: 90.8 fL (ref 80.0–100.0)
Monocytes Absolute: 0.9 10*3/uL (ref 0.1–1.0)
Monocytes Relative: 10 %
Neutro Abs: 6.1 10*3/uL (ref 1.7–7.7)
Neutrophils Relative %: 71 %
Platelets: 454 10*3/uL — ABNORMAL HIGH (ref 150–400)
RBC: 3.16 MIL/uL — ABNORMAL LOW (ref 4.22–5.81)
RDW: 16.4 % — ABNORMAL HIGH (ref 11.5–15.5)
WBC: 8.5 10*3/uL (ref 4.0–10.5)
nRBC: 0 % (ref 0.0–0.2)

## 2022-10-20 LAB — SALICYLATE LEVEL: Salicylate Lvl: <7 mg/dL — ABNORMAL LOW (ref 7.0–30.0)

## 2022-10-20 LAB — PROTIME-INR
INR: 1.3 — ABNORMAL HIGH (ref 0.8–1.2)
Prothrombin Time: 16.1 seconds — ABNORMAL HIGH (ref 11.4–15.2)

## 2022-10-20 LAB — LACTIC ACID, PLASMA: Lactic Acid, Venous: 1.4 mmol/L (ref 0.5–1.9)

## 2022-10-20 LAB — VANCOMYCIN, RANDOM: Vancomycin Rm: 14 ug/mL

## 2022-10-20 LAB — CBG MONITORING, ED: Glucose-Capillary: 170 mg/dL — ABNORMAL HIGH (ref 70–99)

## 2022-10-20 LAB — GLUCOSE, CAPILLARY: Glucose-Capillary: 118 mg/dL — ABNORMAL HIGH (ref 70–99)

## 2022-10-20 LAB — MAGNESIUM: Magnesium: 2.6 mg/dL — ABNORMAL HIGH (ref 1.7–2.4)

## 2022-10-20 MED ORDER — ACETAMINOPHEN 325 MG PO TABS: 650.0000 mg | ORAL_TABLET | Freq: Four times a day (QID) | ORAL | Status: DC | PRN

## 2022-10-20 MED ORDER — CALCITRIOL 0.25 MCG PO CAPS
0.2500 ug | ORAL_CAPSULE | ORAL | Status: DC
  Administered 2022-10-22: 0.25 ug via ORAL
  Filled 2022-10-20: qty 1

## 2022-10-20 MED ORDER — OXYCODONE-ACETAMINOPHEN 5-325 MG PO TABS: 1.0000 | ORAL_TABLET | Freq: Two times a day (BID) | ORAL | Status: DC | PRN

## 2022-10-20 MED ORDER — PANTOPRAZOLE SODIUM 40 MG PO TBEC
40.0000 mg | DELAYED_RELEASE_TABLET | Freq: Every day | ORAL | Status: DC
  Administered 2022-10-21 – 2022-10-22 (×2): 40 mg via ORAL
  Filled 2022-10-20 (×5): qty 1

## 2022-10-20 MED ORDER — ONDANSETRON HCL 4 MG/2ML IJ SOLN
4.0000 mg | Freq: Four times a day (QID) | INTRAMUSCULAR | Status: DC | PRN
  Administered 2022-10-21 – 2022-10-26 (×2): 4 mg via INTRAVENOUS
  Filled 2022-10-20: qty 2

## 2022-10-20 MED ORDER — TETANUS-DIPHTH-ACELL PERTUSSIS 5-2.5-18.5 LF-MCG/0.5 IM SUSY
0.5000 mL | PREFILLED_SYRINGE | Freq: Once | INTRAMUSCULAR | Status: AC
  Administered 2022-10-20: 0.5 mL via INTRAMUSCULAR
  Filled 2022-10-20: qty 0.5

## 2022-10-20 MED ORDER — INSULIN GLARGINE-YFGN 100 UNIT/ML ~~LOC~~ SOLN
7.0000 [IU] | Freq: Every day | SUBCUTANEOUS | Status: DC
  Administered 2022-10-20 – 2022-10-25 (×6): 7 [IU] via SUBCUTANEOUS
  Filled 2022-10-20 (×7): qty 0.07

## 2022-10-20 MED ORDER — CALCIUM ACETATE (PHOS BINDER) 667 MG PO CAPS
2001.0000 mg | ORAL_CAPSULE | Freq: Three times a day (TID) | ORAL | Status: DC
  Administered 2022-10-21 – 2022-10-25 (×6): 2001 mg via ORAL
  Filled 2022-10-20 (×8): qty 3

## 2022-10-20 MED ORDER — ACETAMINOPHEN 650 MG RE SUPP: 650.0000 mg | Freq: Four times a day (QID) | RECTAL | Status: DC | PRN

## 2022-10-20 MED ORDER — MELATONIN 3 MG PO TABS: 3.0000 mg | ORAL_TABLET | Freq: Every evening | ORAL | Status: DC | PRN

## 2022-10-20 MED ORDER — CARVEDILOL 6.25 MG PO TABS
6.2500 mg | ORAL_TABLET | Freq: Two times a day (BID) | ORAL | Status: DC
  Administered 2022-10-21 – 2022-10-22 (×3): 6.25 mg via ORAL
  Filled 2022-10-20 (×3): qty 1

## 2022-10-20 MED ORDER — CHLORHEXIDINE GLUCONATE CLOTH 2 % EX PADS
6.0000 | MEDICATED_PAD | Freq: Every day | CUTANEOUS | Status: DC
  Administered 2022-10-21 – 2022-10-26 (×5): 6 via TOPICAL

## 2022-10-20 MED ORDER — INSULIN ASPART 100 UNIT/ML IJ SOLN
0.0000 [IU] | Freq: Three times a day (TID) | INTRAMUSCULAR | Status: DC
  Administered 2022-10-21 – 2022-10-22 (×2): 1 [IU] via SUBCUTANEOUS

## 2022-10-20 MED ORDER — VANCOMYCIN HCL 750 MG/150ML IV SOLN: 750.0000 mg | INTRAVENOUS | Status: DC

## 2022-10-20 MED ORDER — VANCOMYCIN HCL 750 MG/150ML IV SOLN
750.0000 mg | INTRAVENOUS | Status: DC
  Filled 2022-10-20 (×4): qty 150
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
## 2022-10-20 NOTE — Consult Note (Signed)
Renal Service Consult Note [facility]  ALMEDIN GERLT 10/20/2022 [name], MD Requesting Physician: Dr. Wallace Cullens, [facility] ED  Reason for Consult: ESRD pt w/ recent blood infection at OSH HPI: The patient is a 52 y.o. year-old w/ PMH as below [place], EMS brought pt to ED given the story that follows. Pt has HD here in [place] at [facility], last dialyzed there in early April 2024.  Pt apparently went to [place] to visit friends. Per Care [facility]where he got some dialysis while down there in May. Also had a clotted R forearm AVG declotted by a Dr Daivd Council, possibly on 5/03. Then developed MRSA bacteremia and was admitted and treated w/ IV Vanc, plan 4-6 wks depending on whether AVG culture was positive or not. Pt eventually left AMA and came back to [place] today it appears. He will be admitted for IV abx. We are asked to see him for dialysis.   Pt seen in room. States his last HD was last Wed, 5/29, in a hospital in [place].  He says his last IV abx were last tues or wed, 5/28 or 5/29, not sure which. He denies SOB, endorsed swollen legs. His R arm AVG has been working well since the declot that was done down in [place] in May 2024. Timing of the details are difficult to pick out of CE.    ROS - denies CP, no joint pain, no HA, no blurry vision, no rash, no diarrhea, no nausea/ vomiting, no dysuria, no difficulty voiding   Past Medical History  Past Medical History:  Diagnosis Date   Anxiety 02/2019   Anxiety 10/2019   Diabetes mellitus type II, uncontrolled 07/17/2006   Elevated alkaline phosphatase level 01/2019   ESSENTIAL HYPERTENSION 07/17/2006   Homelessness 10/13/2021   HYPERLIPIDEMIA 11/24/2008   Insomnia 02/2019   Left bundle branch block 02/22/2019   Lesion of lip 10/2019   Pneumonia 10/22/2011   Suicidal ideations 02/2019   Vitamin D deficiency 01/2019   Past Surgical History  Past Surgical History:  Procedure Laterality Date   ARM SURGERY      AV FISTULA PLACEMENT Right 02/20/2021   Procedure: RIGHT ARTERIOVENOUS GRAFT CREATION;  Surgeon: Chuck Hint, MD;  Location: [facility] OR;  Service: Vascular;  Laterality: Right;   INSERTION OF DIALYSIS CATHETER Left 02/20/2021   Procedure: INSERTION OF DIALYSIS CATHETER USING PALINDROME CATHETER;  Surgeon: Chuck Hint, MD;  Location: [facility] OR;  Service: Vascular;  Laterality: Left;   IR AV DIALY SHUNT INTRO NEEDLE/INTRACATH INITIAL W/PTA/IMG RIGHT Right 11/06/2021   IR FLUORO GUIDE CV LINE RIGHT  09/12/2022   IR REMOVAL TUN CV CATH W/O FL  07/27/2021   IR THROMBECTOMY AV FISTULA W/THROMBOLYSIS INC/SHUNT/IMG RIGHT  09/11/2022   IR US GUIDE VASC ACCESS RIGHT  11/06/2021   IR US GUIDE VASC ACCESS RIGHT  09/11/2022   IR US GUIDE VASC ACCESS RIGHT  09/12/2022   ULTRASOUND GUIDANCE FOR VASCULAR ACCESS  02/20/2021   Procedure: ULTRASOUND GUIDANCE FOR VASCULAR ACCESS;  Surgeon: Chuck Hint, MD;  Location: [facility] OR;  Service: Vascular;;   Family History  Family History  Problem Relation Age of Onset   Hypertension Mother 1   Cancer Mother        bladder cancer   Alcohol abuse Father    Hypertension Brother    Social History  reports that he has never smoked. He has never used smokeless tobacco.  He reports that he does not drink alcohol and does not use drugs. Allergies  Allergies  Allergen Reactions   Sulfa Antibiotics Rash    Severe rash.    Keflex [Cephalexin] Other (See Comments)    Patient with severe drug reaction including exfoliating skin rash and hypotension after being prescribed both cephalexin and sulfamethoxazole-trimethoprim simultaneously. Favor SMX as much more likely culprit as patient had tolerated other cephalosporins in the past, but cannot say with complete certainty that this was not related to cephalexin.    Other Other (See Comments)    Patient states he is allergic to the TB test - unknown reaction during childhood    Bactrim  [Sulfamethoxazole-Trimethoprim] Rash   Home medications Prior to Admission medications   Medication Sig Start Date End Date Taking? Authorizing Provider  acetaminophen (TYLENOL) 500 MG tablet Take 1 tablet (500 mg total) by mouth every 8 (eight) hours as needed for mild pain. Patient not taking: Reported on 09/11/2022 05/28/22   Leroy Sea, MD  amLODipine (NORVASC) 10 MG tablet Take 1 tablet (10 mg total) by mouth daily. Patient not taking: Reported on 09/11/2022 05/26/22   Leroy Sea, MD  ASPIRIN LOW DOSE 81 MG tablet Take 1 tablet (81 mg total) by mouth daily. Patient not taking: Reported on 09/11/2022 05/28/22   Leroy Sea, MD  benzonatate (TESSALON) 100 MG capsule Take 1 capsule (100 mg total) by mouth every 8 (eight) hours. Patient not taking: Reported on 09/11/2022 09/09/22   Sabas Sous, MD  calcium acetate (PHOSLO) 667 MG capsule Take 3 capsules (2,001 mg total) by mouth 3 (three) times daily. Patient not taking: Reported on 09/11/2022 05/28/22   Leroy Sea, MD  carvedilol (COREG) 6.25 MG tablet Take 1 tablet (6.25 mg total) by mouth 2 (two) times daily with a meal. Patient not taking: Reported on 09/11/2022 05/28/22   Leroy Sea, MD  Fingerstix Lancets MISC Use as directed up to 4 times daily. Patient not taking: Reported on 09/11/2022 05/28/22   Leroy Sea, MD  glucose blood test strip Use to check fasting blood sugar once daily. 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 Patient not taking: Reported on 09/11/2022 05/28/22   Leroy Sea, MD  insulin glargine (LANTUS SOLOSTAR)  100 UNIT/ML Solostar Pen Inject 35 Units into the skin daily.Not to exceed 100 units per day. Patient not taking: Reported on 09/11/2022 06/19/22     Insulin Pen Needle 32G X 4 MM MISC Use with insulin pens Patient not taking: Reported on 09/11/2022 05/28/22   Leroy Sea, MD  Insulin Syringe-Needle U-100 25G X 1" 1 ML MISC For 4 times a day insulin SQ, 1 month supply. Diagnosis E11.65 Patient not taking: Reported on 09/11/2022 05/28/22   Leroy Sea, MD  isosorbide-hydrALAZINE (BIDIL) 20-37.5 MG tablet Take 1 tablet by mouth 3 (three) times daily. Patient not taking: Reported on 09/11/2022 07/20/22     oxyCODONE-acetaminophen (PERCOCET/ROXICET) 5-325 MG tablet Take 1 tablet by mouth every 12 (twelve) hours as needed for severe pain. Patient  not taking: Reported on 09/11/2022 07/04/22   Derwood Kaplan, MD  PROTONIX 40 MG tablet Take 1 tablet (40 mg total) by mouth daily. Patient not taking: Reported on 09/11/2022 05/25/22   Leroy Sea, MD  sevelamer carbonate (RENVELA) 800 MG tablet Take 2 tablets (1,600 mg total) by mouth 3 (three) times daily with meals. May also take 1 tablet (800 mg total) 2 (two) times daily as needed with snacks. Patient not taking: Reported on 09/11/2022 05/28/22   Leroy Sea, MD  gabapentin (NEURONTIN) 300 MG capsule Take 1 capsule (300 mg total) by mouth 3 (three) times daily. Patient not taking: Reported on 07/27/2019 07/29/18 08/30/19  Kallie Locks, FNP  sildenafil (VIAGRA) 25 MG tablet Take 1 tablet (25 mg total) by mouth as needed for erectile dysfunction. for erectile dysfunction. Patient not taking: Reported on 08/11/2020 10/13/19 11/27/20  Kallie Locks, FNP     Vitals:   10/20/22 1325 10/20/22 1327 10/20/22 1705 10/20/22 1716  BP: (!) 159/81  (!) 161/83   Pulse: 88  89   Resp: 10  10   Temp: (!) 97.4 F (36.3 C)   98 F (36.7 C)  TempSrc: Oral   Oral  SpO2: 97%  96%   Weight:  72.6 kg    Height:  5\' 7"  (1.702 m)     Exam Gen alert, no  distress No rash, cyanosis or gangrene Sclera anicteric, throat clear  No jvd or bruits Chest clear bilat to bases, no rales/ wheezing RRR no MRG Abd soft ntnd no mass or ascites +bs GU normal male MS no joint effusions or deformity Ext no LE or UE edema, no wounds or ulcers Neuro is alert, Ox 3 , nf    R forearm AVG +bruit, one small sore/ ulceration on the lateral limb 1x 1 cm.     Home meds include - norvasc 10, asa, phoslo 3 ac tid, coreg 6.25 bid, insulin aspart/ glargine, bidil tid, percocet prn, protonix, renvela 1600mg  ac tid, prns/ vits/ supps     OP HD: [facility] TTS  4h  400/ 800   61.5kg  2/2.5 bath  RFA AVG  Heparin 2000 - last HD in [place] recorded on 09/06/22, post wt 74.3kg (dry wt was 61.5 back then too) - rocaltrol 0.25 mcg po tiw - mircera 100 mcg IV q 2 wks, last 09/06/22    VS 161/83, HR 88, RR 12, temp 98   Na 136  K 5.0  CO2 21  BUN 60  creat 9.5  WBC 8    Hb 8.4     Assessment/ Plan: MRSA bacteremia - at OSH in [place] in May 2024, pt was rx'd w/ IV vanc but left after his course could be completed. Likely his last dose was about 4-5 days ago. Repeat blood cx's have already been sent here. Pharmacy has been consulted to resume IV vanc here. Per pmd.  SP declot R forearm AVG - possibly done no 5/03, done out of town also. Good bruit here today.  ESRD - on HD TTS. Plan short HD tomorrow then usual HD Tuesday. May need to see if he needs to CLIP'd again.  HTN/ volume - volume overload, which is chronic for him. He usually runs 6-10 kg over his dry wt. Max UF w/ HD.  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  MBD ckd - Ca in range. Phos and alb ordered for am tomorrow. Resume rocaltrol po tiw. Get pth.  Homelessness - social issues    Vinson Moselle  MD [facility] 10/20/2022, 7:09 PM  Recent Labs  Lab 10/20/22 1708  HGB 8.4*  CALCIUM 8.1*  CREATININE 9.49*  K 5.0   Inpatient medications:

## 2022-10-20 NOTE — ED Notes (Signed)
Pt wound wrapped with non-adhesive and Kerlix.

## 2022-10-20 NOTE — H&P (Signed)
History and Physical      FAMOUS SPELLER ZOX:[id number] DOB: [birth year] DOA: 10/20/2022; DOS: 10/20/2022  PCP: [name]  Patient coming from: home   I have personally briefly reviewed patient's old medical records in [facility]  Chief Complaint: Fall  HPI: [name] is a 52 y.o. male with medical history significant for end-stage renal disease on hemodialysis, type 2 diabetes mellitus, essential pretension, anemia of chronic kidney disease associated baseline hemoglobin range 8-12, recent hospitalization for MRSA bacteremia, who is admitted to [facility] on 10/20/2022 with anion gap metabolic acidosis after presenting from home to [facility] ED complaining of fall.   The following history is provided by the patient, in addition to my discussions with the EDP and via chart review.  The patient was recently hospitalized up until a few days ago in [place] for MRSA bacteremia, with hospital course reportedly complicated by the presence of septic emboli associated with his AV fistula.  He was treated with IV vancomycin during that hospitalization, but subsequently left the [place] hospital AMA a few days ago in order to attend a court date in the [place] area.  His medical history is notable for end-stage renal disease on hemodialysis, noting that he missed his most recent scheduled hemodialysis session that would have otherwise occurred on Friday, 10/18/2022, noting that his most recently completed hemodialysis session occurred while he was still hospitalized in [place], on Wednesday, 10/16/2022.  As he was receiving IV vancomycin at the time of his hemodialysis sessions during the hospitalization in [place], he notes that Wednesday, 10/16/2022 also represents his most recent dose of IV vancomycin for his MRSA bacteremia.  Upon arriving in the [place] area today via bus, the patient reports that he tripped while exiting the bus, reporting that the mechanism of his fall was on  the basis of tripping over an orange cone located at the base of the stairs of the bus.  As a consequence of this fall, he fell to his right side, hitting his right knee and right elbow on the pavement.  He conveys that he did not hit his head as a component of this fall, and there was no associated loss of consciousness.  He conveys that he incurred small abrasions to both the right elbow and the right knee as a consequence of this fall, and denies some residual sharp radiating discomfort associated with the anterior aspect of the right knee as well as the posterior aspect of the right elbow.  Otherwise, denies any acute arthralgias or myalgias as a consequence of this fall.  No headache or neck pain at this time.  While he is prescribed daily baby aspirin, he reports intermittent compliance with his antiplatelet medication.  Otherwise denies use of any blood thinners as an outpatient.  At this time, he denies any subjective fever, chills, rigors, or generalized myalgias.  No recent chest pain, shortness of breath palpitations, diaphoresis.  Denies any recent recreational drug use or any recent alcohol consumption.  No recent nausea, vomiting, diarrhea, abdominal pain, or rash.     ED Course:  Vital signs in the ED were notable for the following: Afebrile; heart rate in the range 89-96; stop blood pressures in the 150s to 160s; respiratory rate 14-16, oxygen saturation 96 to 97% on room air.  Labs were notable for the following: BMP notable for sodium 136, potassium 5.0, bicarbonate 21, anion gap 18, creatinine 9.49, glucose 151.  Lactic acid 1.4.  CBC notable for white cell  count 8500, hemoglobin 8.4 associated with normocytic finding.  Blood cultures x 2 were collected prior to reinitiation of IV vancomycin.  Imaging and additional notable ED work-up: Chest x-ray, 1 view, performed radiology read, shows no evidence of acute cardiopulmonary process, including no evidence of infiltrate, edema, effusion,  or pneumothorax.  Plain films of the right elbow, performed radiology read, shows no evidence of acute process, including evidence of acute fracture.  Please also the right knee, performed radiology read, shows no evidence of acute process, including no evidence of acute fracture.  EDP has d/w nephrology (Dr. Arta Silence) who will formally consult and is planning for HD in the morning. Additionally, EDP has d/w on-call ID (Dr. Earlene Plater) who will consult/follow for MRSA bacteremia.   While in the ED, the following were administered: Tdap booster.  Subsequently, the patient was admitted for further evaluation and management of presenting anion gap metabolic acidosis with plan to address via hemodialysis, after presenting with ground-level mechanical fall.       Review of Systems: As per HPI otherwise 10 point review of systems negative.   Past Medical History:  Diagnosis Date   Anxiety 02/2019   Anxiety 10/2019   Diabetes mellitus type II, uncontrolled 07/17/2006   Elevated alkaline phosphatase level 01/2019   ESSENTIAL HYPERTENSION 07/17/2006   Homelessness 10/13/2021   HYPERLIPIDEMIA 11/24/2008   Insomnia 02/2019   Left bundle branch block 02/22/2019   Lesion of lip 10/2019   Pneumonia 10/22/2011   Suicidal ideations 02/2019   Vitamin D deficiency 01/2019    Past Surgical History:  Procedure Laterality Date   ARM SURGERY     AV FISTULA PLACEMENT Right 02/20/2021   Procedure: RIGHT ARTERIOVENOUS GRAFT CREATION;  Surgeon: Chuck Hint, MD;  Location: [facility] OR;  Service: Vascular;  Laterality: Right;   INSERTION OF DIALYSIS CATHETER Left 02/20/2021   Procedure: INSERTION OF DIALYSIS CATHETER USING PALINDROME CATHETER;  Surgeon: Chuck Hint, MD;  Location: [facility] OR;  Service: Vascular;  Laterality: Left;   IR AV DIALY SHUNT INTRO NEEDLE/INTRACATH INITIAL W/PTA/IMG RIGHT Right 11/06/2021   IR FLUORO GUIDE CV LINE RIGHT  09/12/2022   IR REMOVAL TUN CV CATH W/O FL  07/27/2021   IR  THROMBECTOMY AV FISTULA W/THROMBOLYSIS INC/SHUNT/IMG RIGHT  09/11/2022   IR US GUIDE VASC ACCESS RIGHT  11/06/2021   IR US GUIDE VASC ACCESS RIGHT  09/11/2022   IR US GUIDE VASC ACCESS RIGHT  09/12/2022   ULTRASOUND GUIDANCE FOR VASCULAR ACCESS  02/20/2021   Procedure: ULTRASOUND GUIDANCE FOR VASCULAR ACCESS;  Surgeon: Chuck Hint, MD;  Location: [facility] OR;  Service: Vascular;;    Social History:  reports that he has never smoked. He has never used smokeless tobacco. He reports that he does not drink alcohol and does not use drugs.   Allergies  Allergen Reactions   Sulfa Antibiotics Rash    Severe rash.  Severe rash.     Severe rash.   Other Other (See Comments)    Patient states he is allergic to the TB test - unknown reaction during childhood    Tuberculin Ppd     Other Reaction(s): Other (See Comments)   Bactrim [Sulfamethoxazole-Trimethoprim] Rash   Cephalexin Other (See Comments) and Rash    Patient with severe drug reaction including exfoliating skin rash and hypotension after being prescribed both cephalexin and sulfamethoxazole-trimethoprim simultaneously. Favor SMX as much more likely culprit as patient had tolerated other cephalosporins in the past, but cannot say with complete certainty that  this was not related to cephalexin.  Other Reaction(s): Other, Other (See Comments)  Patient with severe drug reaction including exfoliating skin rash and hypotension after being prescribed both cephalexin and sulfamethoxazole-trimethoprim simultaneously. Favor SMX as much more likely culprit as patient had tolerated other cephalosporins in the past, but cannot say with complete certainty that this was not related to cephalexin.   "Patient with severe drug reaction including exfoliating skin rash and hypotension after being prescribed both cephalexin and sulfamethoxazole-trimethoprim simultaneously. Favor SMX as much more likely culprit as patient had tolerated other cephalosporins in  the past, but cannot say with complete certainty that this was not related to cephalexin." As noted in [facility]   "Patient with severe drug reaction including exfoliating skin rash and hypotension after being prescribed both cephalexin and sulfamethoxazole-trimethoprim simultaneously. Favor SMX as much more likely culprit as patient had tolerated other cephalosporins in the past, but cannot say with complete certainty that this was not related to cephalexin." As noted in [facility]   "Patient with severe drug reaction including exfoliating skin rash and hypotension after being prescribed both cephalexin and sulfamethoxazole-trimethoprim simultaneously. Favor SMX as much more likely culprit as patient had tolerated other cephalosporins in the past, but cannot say with complete certainty that this was not related to cephalexin." As noted in [facility]     Patient with severe drug reaction including exfoliating skin rash and hypotension after being prescribed both cephalexin and sulfamethoxazole-trimethoprim simultaneously. Favor SMX as much more likely culprit as patient had tolerated other cephalosporins in the past, but cannot say with complete certainty that this was not related to cephalexin.  "Patient with severe drug reaction including exfoliating skin rash and hypotension after being prescribed both cephalexin and sulfamethoxazole-trimethoprim simultaneously. Favor SMX as much more likely culprit as patient had tolerated other cephalosporins in the past, but cannot say with complete certainty that this was not related to cephalexin." As noted in [facility]  "Patient with severe drug reaction including exfoliating skin rash and hypotension after being prescribed both cephalexin and sulfamethoxazole-trimethoprim simultaneously. Favor SMX as much more likely culprit as patient had tolerated other cephalosporins in the past, but cannot say with complete certainty that this was not related to cephalexin."  As noted in [facility]     Patient with severe drug reaction inc... (TRUNCATED)    Family History  Problem Relation Age of Onset   Hypertension Mother 21   Cancer Mother        bladder cancer   Alcohol abuse Father    Hypertension Brother      Prior to Admission medications   Medication Sig Start Date End Date Taking? Authorizing Provider  acetaminophen (TYLENOL) 500 MG tablet Take 1 tablet (500 mg total) by mouth every 8 (eight) hours as needed for mild pain. 05/28/22   Leroy Sea, MD  amLODipine (NORVASC) 10 MG tablet Take 1 tablet (10 mg total) by mouth daily. 05/26/22   Leroy Sea, MD  ASPIRIN LOW DOSE 81 MG tablet Take 1 tablet (81 mg total) by mouth daily. 05/28/22   Leroy Sea, MD  benzonatate (TESSALON) 100 MG capsule Take 1 capsule (100 mg total) by mouth every 8 (eight) hours. 09/09/22   Sabas Sous, MD  calcium acetate (PHOSLO) 667 MG capsule Take 3 capsules (2,001 mg total) by mouth 3 (three) times daily. 05/28/22   Leroy Sea, MD  carvedilol (COREG) 6.25 MG tablet Take 1 tablet (6.25 mg total) by mouth 2 (  two) times daily with a meal. 05/28/22   Leroy Sea, MD  Fingerstix Lancets MISC Use as directed up to 4 times daily. 05/28/22   Leroy Sea, MD  glucose blood test strip Use to check fasting blood sugar once daily. 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 Insulin dependent 11/29/20   Hollice Espy, MD  glucose blood test strip Use as directed up to 4 times daily 05/28/22   Leroy Sea, MD  insulin aspart (NOVOLOG) 100 UNIT/ML FlexPen Before each meal 3 times a day, 140-199 - 2 units, 200-250 - 4 units, 251-299 - 6 units,  300-349 - 8 units,  350 or above 10 units. 05/28/22   Leroy Sea, MD  insulin glargine (LANTUS SOLOSTAR) 100 UNIT/ML Solostar Pen Inject 35 Units into the skin daily.Not to exceed 100 units per day. 06/19/22     Insulin Pen Needle 32G X 4 MM MISC Use with insulin pens 05/28/22   Leroy Sea, MD  Insulin Syringe-Needle U-100 25G X  1" 1 ML MISC For 4 times a day insulin SQ, 1 month supply. Diagnosis E11.65 05/28/22   Leroy Sea, MD  isosorbide-hydrALAZINE (BIDIL) 20-37.5 MG tablet Take 1 tablet by mouth 3 (three) times daily. 07/20/22     oxyCODONE-acetaminophen (PERCOCET/ROXICET) 5-325 MG tablet Take 1 tablet by mouth every 12 (twelve) hours as needed for severe pain. 07/04/22   Derwood Kaplan, MD  PROTONIX 40 MG tablet Take 1 tablet (40 mg total) by mouth daily. 05/25/22   Leroy Sea, MD  sevelamer carbonate (RENVELA) 800 MG tablet Take 2 tablets (1,600 mg total) by mouth 3 (three) times daily with meals. May also take 1 tablet (800 mg total) 2 (two) times daily as needed with snacks. 05/28/22   Leroy Sea, MD  gabapentin (NEURONTIN) 300 MG capsule Take 1 capsule (300 mg total) by mouth 3 (three) times daily. Patient not taking: Reported on 07/27/2019 07/29/18 08/30/19  Kallie Locks, FNP  sildenafil (VIAGRA) 25 MG tablet Take 1 tablet (25 mg total) by mouth as needed for erectile dysfunction. for erectile dysfunction. Patient not taking: Reported on 08/11/2020 10/13/19 11/27/20  Kallie Locks, FNP     Objective    Physical Exam: Vitals:   10/20/22 1325 10/20/22 1327 10/20/22 1705 10/20/22 1716  BP: (!) 159/81  (!) 161/83   Pulse: 88  89   Resp: 10  10   Temp: (!) 97.4 F (36.3 C)   98 F (36.7 C)  TempSrc: Oral   Oral  SpO2: 97%  96%   Weight:  72.6 kg    Height:  5\' 7"  (1.702 m)      General: appears to be stated age; alert, oriented Skin: warm, dry, abrasions to the anterior aspect of the right knee as well as the posterior aspect of the right elbow Head:  AT/[place] Mouth:  Oral mucosa membranes appear moist, normal dentition Neck: supple; trachea midline Heart:  RRR; did not appreciate any M/R/G Lungs: CTAB, did not appreciate any wheezes, rales, or rhonchi Abdomen: + BS; soft, ND, NT Vascular: 2+ pedal pulses b/l; 2+ radial pulses b/l Extremities: no peripheral edema, no muscle wasting;  abrasions to the anterior aspect of the right knee as well as the posterior aspect of the right elbow Neuro: strength and sensation intact in upper and lower extremities b/l     Labs on Admission: I have personally reviewed following labs and imaging studies  CBC: Recent Labs  Lab 10/20/22 1708  WBC 8.5  NEUTROABS 6.1  HGB 8.4*  HCT 28.7*  MCV 90.8  PLT 454*   Basic Metabolic Panel: Recent Labs  Lab 10/20/22 1708  NA 136  K 5.0  CL 97*  CO2 21*  GLUCOSE 151*  BUN 60*  CREATININE 9.49*  CALCIUM 8.1*   GFR: Estimated Creatinine Clearance: 8.8 mL/min (A) (by C-G formula based on SCr of 9.49 mg/dL (H)). Liver Function Tests: No results for input(s): "AST", "ALT", "ALKPHOS", "BILITOT", "PROT", "ALBUMIN" in the last 168 hours. No results for input(s): "LIPASE", "AMYLASE" in the last 168 hours. No results for input(s): "AMMONIA" in the last 168 hours. Coagulation Profile: No results for input(s): "INR", "PROTIME" in the last 168 hours. Cardiac Enzymes: No results for input(s): "CKTOTAL", "CKMB", "CKMBINDEX", "TROPONINI" in the last 168 hours. BNP (last 3 results) No results for input(s): "PROBNP" in the last 8760 hours. HbA1C: No results for input(s): "HGBA1C" in the last 72 hours. CBG: Recent Labs  Lab 10/20/22 1339  GLUCAP 170*   Lipid Profile: No results for input(s): "CHOL", "HDL", "LDLCALC", "TRIG", "CHOLHDL", "LDLDIRECT" in the last 72 hours. Thyroid Function Tests: No results for input(s): "TSH", "T4TOTAL", "FREET4", "T3FREE", "THYROIDAB" in the last 72 hours. Anemia Panel: No results for input(s): "VITAMINB12", "FOLATE", "FERRITIN", "TIBC", "IRON", "RETICCTPCT" in the last 72 hours. Urine analysis:    Component Value Date/Time   COLORURINE YELLOW 02/19/2021 0045   APPEARANCEUR HAZY (A) 02/19/2021 0045   APPEARANCEUR Clear 05/09/2020 1509   LABSPEC 1.013 02/19/2021 0045   PHURINE 5.0 02/19/2021 0045   GLUCOSEU >=500 (A) 02/19/2021 0045   HGBUR  MODERATE (A) 02/19/2021 0045   BILIRUBINUR NEGATIVE 02/19/2021 0045   BILIRUBINUR Negative 05/09/2020 1509   KETONESUR NEGATIVE 02/19/2021 0045   PROTEINUR >=300 (A) 02/19/2021 0045   UROBILINOGEN 0.2 02/04/2020 1612   UROBILINOGEN 0.2 02/16/2014 0045   NITRITE NEGATIVE 02/19/2021 0045   LEUKOCYTESUR NEGATIVE 02/19/2021 0045    Radiological Exams on Admission: DG Knee Complete 4 Views Right  Result Date: 10/20/2022 CLINICAL DATA:  Ground level fall.  Right knee pain. EXAM: RIGHT KNEE - COMPLETE 4+ VIEW COMPARISON:  07/11/2022. FINDINGS: No fracture.  No bone lesion. 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 Knee joint normally spaced and aligned. No degenerative/arthropathic changes. No joint effusion. Soft tissues are unremarkable. IMPRESSION: 1. No fracture or acute finding. Electronically Signed   By: Amie Portland M.D.   On: 10/20/2022 14:47   DG Elbow Complete Right  Result Date: 10/20/2022 CLINICAL DATA:  Ground level fall.  Elbow pain. EXAM: RIGHT ELBOW - COMPLETE 3+ VIEW COMPARISON:  03/06/2010. FINDINGS: No acute fracture. Prosthetic radial head appears well seated and aligned. Narrowing of the humeral olecranon joint space with mild associated subchondral sclerosis and marginal osteophytes. No convincing joint effusion. Forearm AV fistula. IMPRESSION: 1. No fracture or acute finding. 2. 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 Well seated and positioned radial head prosthesis. Electronically Signed   By: Amie Portland M.D.   On: 10/20/2022 14:45   DG Chest Portable 1 View  Result Date: 10/20/2022 CLINICAL DATA:  Pain.  Fall.  Dialysis patient. EXAM: PORTABLE CHEST 1 VIEW COMPARISON:  September 17, 2022 FINDINGS: Stable cardiomegaly. No pneumothorax. No overt edema, nodule, mass, or infiltrate. No acute abnormalities are noted. A nonacute fracture of the distal left clavicle is again identified. Degenerative changes identified in  the shoulders. IMPRESSION: 1. No active disease. 2. Nonacute fracture of the distal left  clavicle. Electronically Signed   By: Gerome Sam III M.D.   On: 10/20/2022 14:43      Assessment/Plan   Principal Problem:   High anion gap metabolic acidosis Active Problems:   End-stage renal disease on hemodialysis (HCC)   History of anemia due to chronic kidney disease   Essential hypertension   DM2 (diabetes mellitus, type 2) (HCC)   MRSA bacteremia   Fall at home, initial encounter   GERD (gastroesophageal reflux disease)        #) Anion gap metabolic acidosis: As evidenced on presenting BMP, which appears to be as consequence of having missed his most recent hemodialysis session in the context of a history of end-stage renal disease.  In the context of a recent history of MRSA bacteremia, he was evaluated for sepsis, however, in the absence of fever and in the absence of leukocytosis, SIRS criteria for sepsis not currently met.  Additionally lactic acid found to be nonelevated at 1.4.  He has a known history of diabetes, but with additional laboratory findings not consistent with DKA.  In terms of other factors potentially contributing to his anion gap metabolic acidosis, will add on INR to evaluate synthetic hepatic function.  Rhabdomyolysis appears less likely, given no report of any prolonged downtime after his ground-level mechanical fall earlier today.  EDP is discussed patient's case with on-call nephrology, Dr. Arta Silence, Who will formally consult and is planning to pursue hemodialysis in the morning, representing definitive management for the patient's anion gap metabolic acidosis in the setting of having missed recent hemodialysis session.  Plan: Nephrology to formally consult, plan for HD in the morning.  Monitor strict I's and O's and daily weights.  Check INR.  Add on salicylate level.  Check blood gas.  Repeat CMP, CBC in the morning.              #) ESRD: on HD  (schedule: It appears that he is on a Monday, Wednesday, Friday schedule).  Missed most recent scheduled hemodialysis session, with his most recent completed HD session reported of occurred on Wednesday, 10/16/2022.  In the context of this recent missed HD session, presentation is notable for the presence of anion gap metabolic acidosis, but otherwise, demonstrates potassium level within normal limits, no evidence of acute volume overload, and the patient does not appear overtly uremic.  Case has been discussed with on-call nephrology, who will consult, and are planning for hemodialysis in the morning (6/3), as further detailed above.  Plan: monitor strict I's/O's, daily weights. CMP in the AM. Check mag and phos levels.  Nephrology to consult, with plan for HD in the morning, as further detailed above.  Further evaluation management of presenting anion gap metabolic acidosis, as above.              #) History of MRSA bacteremia: Is reported to been recently hospitalized in [place] for MRSA bacteremia complicated by septic emboli involving his AV fistula, with functionality of AV fistula reportedly over the course of his hospitalization in [place].  However, it is noted in the left the hospital [place] AMA, and appears that he has not finished his course of IV vancomycin.  It is currently unclear to me where he currently stands in terms of the remainder of the recommended duration of IV vancomycin coverage.  Will request records from the [facility] to better determine the nature of his MRSA bacteremia as well as the status of his IV vancomycin course.  Of note, in  the absence of leukocytosis and in the absence of fever, SIRS criteria not currently met for sepsis.  He appears hemodynamically stable.  Blood cultures x 2 were collected in the ED today.  Plan: I have placed order requesting records from the patient's most recent hospitalization on exam, including associated discharge  summary to gain further insight into the nature of his MRSA bacteremia.  Continue IV vancomycin.  Follow-up results of blood cultures x 2 collected in the ED today.  Repeat CBC in the morning.                 #) Ground level mechanical fall: reported ground level fall that appears to be purely mechanical in nature, with patient conveying that they tripped over an orange cone while stepping off a local bus, without any preceding or resultant loss of consciousness. Not a/w any presyncope or chest pain. Did not hit head as a component of this fall. Blood thinners as outpatient: Intermittent compliance with prescribed daily baby aspirin. Principal point of contact right elbow and right knee, which appear to have incurred mild abrasions as a consequence, but with plain films of both the right knee and right elbow showing no evidence of acute process, including no evidence of acute fracture the right upper and lower extremities appear neurovascularly intact at this time.   While this fall appears to be purely mechanical in nature, risk factors include known underlying infection in the form of recent diagnosis of MRSA bacteremia.  Otherwise, no overt evidence of additional active infectious process at this time, including chest x-ray, which showed no evidence of acute cardiopulmonary process, including no evidence of infiltrate.    Plan: Fall precautions. Repeat CMP and CBC with differential in the morning.  PT consult ordered.  Prn acetaminophen.                 #) Type 2 Diabetes Mellitus: documented history of such. Home insulin regimen: Lantus 35 units SQ nightly as well as sliding scale NovoLog 3 times daily. Home oral hypoglycemic agents: None. presenting blood sugar: 151. Most recent A1c noted to be 7.7% when checked on 09/08/2022.  The patient notes intermittent compliance with his basal and short acting insulin.  Consequently, in terms of initial dose of basal insulin to be  started during this hospitalization, will resume a little less than 1/3 of outpatient dose in order to reduce risk for ensuing hypoglycemia.    Plan: accuchecks QAC and HS with low dose SSI.  Lantus 7 units SQ nightly.                #) Essential Hypertension: documented h/o such, with outpatient antihypertensive regimen including Coreg, Norvasc, isosorbide hydralazine.  SBP's in the ED today: 150s to 160s mmHg. in the context of underlying infectious process in the form of MRSA bacteremia as well as report of intermittent compliance with these and hypertensive medications, will be conservative with the resumption in order to prevent ensuing development of hypotension.  Plan: Close monitoring of subsequent BP via routine VS. resume home [place].  Hold Norvasc and isosorbide/hydralazine for now.                #) GERD: documented h/o such; on Protonix as outpatient.   Plan: continue home PPI.                #) Anemia of chronic kidney disease: Documented history of such, a/w with baseline hgb range 8-12, with presenting hgb consistent with this  range, in the absence of any overt evidence of active bleed.     Plan: Repeat CBC in the morning.  Check INR.       DVT prophylaxis: SCD's   Code Status: Full code Family Communication: none Disposition Plan: Per Rounding Team Consults called: EDP has d/w nephrology (Dr. Arta Silence) who will formally consult and is planning for HD in the morning. Additionally, EDP has d/w on-call ID (Dr. Earlene Plater) who will consult/follow for MRSA bacteremia.  Admission status: obs     I SPENT GREATER THAN 75  MINUTES IN CLINICAL CARE TIME/MEDICAL DECISION-MAKING IN COMPLETING THIS ADMISSION.      Chaney Born Gwenda Heiner DO [facility]  From 7PM - 7AM   10/20/2022, 8:05 PM

## 2022-10-20 NOTE — Progress Notes (Addendum)
Pharmacy Antibiotic Note  [name] is a 52 y.o. male admitted on 10/20/2022 with bacteremia.  Pharmacy has been consulted for vancomycin dosing. He has a past medical history for HTN, HF, T2DM, ESRD (HD TTS), history of dialysis noncompliance, and history of strep bacteremia (05/2022). On 5/19, He was previously being treated for staph bacteremia in [place] with the suspected source being cellulitis on left arm with abscess. He also had a concern for AVG infection or suspected thrombus of AVG.   Bcx speciated MRSA with oxacillin R detected. They planned for for 4-6 weeks of antibiotics and for the patient to be admitted to [facility], but was not approved due to out-of-state insurance. Patient currently moved from [place] back to [place] and last received hemodialysis Wednesday, 5/29, and vancomycin for current staph bacteremia. VR 14, WBC 8.5, afebrile, blood cultures pending. No emergent dialysis today. Patient will be receiving an extra session of HD on Monday to return to a TTS schedule. He was previously getting vancomycin 500 mg IV after HD TTS with Vanc trough at 13   Plan: Vancomycin 750 mg after hemodialysis on Monday and TTS (based on weight) Monitor blood cultures, WBC, fever curve, signs of clinical improvement  Determine length of therapy   Height: 5\' 7"  (170.2 cm) Weight: 72.6 kg (160 lb) IBW/kg (Calculated) : 66.1  Temp (24hrs), Avg:97.4 F (36.3 C), Min:97.4 F (36.3 C), Max:97.4 F (36.3 C)  No results for input(s): "WBC", "CREATININE", "LATICACIDVEN", "VANCOTROUGH", "VANCOPEAK", "VANCORANDOM", "GENTTROUGH", "GENTPEAK", "GENTRANDOM", "TOBRATROUGH", "TOBRAPEAK", "TOBRARND", "AMIKACINPEAK", "AMIKACINTROU", "AMIKACIN" in the last 168 hours.  CrCl cannot be calculated (Patient's most recent lab result is older than the maximum 21 days allowed.).    Allergies  Allergen Reactions   Sulfa Antibiotics Rash    Severe rash.    Keflex [Cephalexin] Other (See Comments)     Patient with severe drug reaction including exfoliating skin rash and hypotension after being prescribed both cephalexin and sulfamethoxazole-trimethoprim simultaneously. Favor SMX as much more likely culprit as patient had tolerated other cephalosporins in the past, but cannot say with complete certainty that this was not related to cephalexin.    Other Other (See Comments)    Patient states he is allergic to the TB test - unknown reaction during childhood    Bactrim [Sulfamethoxazole-Trimethoprim] Rash    Antimicrobials this admission: Vancomycin 5/23-5/24 >>  (based on note from care everywhere on 5/19)  Microbiology results: 6/2 BCx:   Thank you for allowing pharmacy to be a part of this patient's care.  [name] 10/20/2022 5:13 PM

## 2022-10-20 NOTE — ED Provider Notes (Signed)
[place] [facility]ERGENCY DEPARTMENT AT [facility] Provider Note  CSN: [id number] Arrival date & time: 10/20/22 1255  Chief Complaint(s) Fall  HPI [name] is a 52 y.o. male with past medical history as below, significant for ESRD on HD Tuesday Thursday Saturday, homeless, DM, noncompliance to medication regimen, poorly controlled hypertension, CHF who presents to the ED with complaint of fall.  Per chart review patient was seen in [place] and was admitted concern for MRSA bacteremia, left arm cellulitis with abscess, there was concern for AVG infection or septic thrombus from AVG.  Recommended for vancomycin for 4 to 6 weeks and for LTAC placement as he is homeless and has poor compliance with medication regimen however he was not approved for LTAC as he has out-of-state insurance (this was in [place]).  He had echocardiogram performed while he was hospitalized that did not show any vegetations did show LVEF 45 to 50% with small pericardial effusion.  Blood cultures at that time "speciated to MRSA and oxa=R CONS."  Had declotting procedure performed 5/28 w/o complication.  He is receiving IV vancomycin 3 times weekly with his dialysis while hospitalized.  Intermittently was refusing care per note from outside facility.  He left AMA on 5/29 because he had court date in [place] apparently and also did not want to stay in hospital for duration of abx treatment (4-6 wks) as does not have reliable HD established. He arrives today from [place], unfortunately missed court date. Tripped over a traffic cone when getting off the bus and fell onto his right elbow and right knee. No head injury or LOC. No fevers/chills/nausea or vomiting. Last HD was Wed 5/29. Was previously on HD TTS. At this time he is agreeable to continued care that was initiated in [place]. He reports that he is willing to complete IV abx for 4-6 wks and following that he will be moving to [facility] or [place] permanently.    status post right AV  graft thrombectomy done on 05/03 (Dr. Daivd Council)     Past Medical History Past Medical History:  Diagnosis Date   Anxiety 02/2019   Anxiety 10/2019   Diabetes mellitus type II, uncontrolled 07/17/2006   Elevated alkaline phosphatase level 01/2019   ESSENTIAL HYPERTENSION 07/17/2006   Homelessness 10/13/2021   HYPERLIPIDEMIA 11/24/2008   Insomnia 02/2019   Left bundle branch block 02/22/2019   Lesion of lip 10/2019   Pneumonia 10/22/2011   Suicidal ideations 02/2019   Vitamin D deficiency 01/2019   Patient Active Problem List   Diagnosis Date Noted   MRSA bacteremia 10/20/2022   High anion gap metabolic acidosis 10/20/2022   Hemodialysis AV fistula thrombosis, initial encounter (HCC) 09/11/2022   AV fistula occlusion (HCC) 09/11/2022   CAP (community acquired pneumonia) 05/20/2022   Chronic combined systolic and diastolic congestive heart failure (HCC) 05/20/2022   Anxiety disorder, unspecified 03/26/2022   Hypocalcemia 03/25/2022   Iron deficiency anemia, unspecified 03/20/2022   CHF (congestive heart failure) (HCC) 11/15/2021   Nausea vomiting and diarrhea 11/07/2021   Homelessness 10/13/2021   Acute hypoxic respiratory failure (HCC) 10/13/2021   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) 09/28/2021   Aspiration pneumonia (HCC) 09/28/2021   Volume overload 04/12/2021   Mild protein-calorie malnutrition (HCC) 03/06/2021   Aftercare including intermittent dialysis (HCC) 02/27/2021   Allergy, unspecified, initial encounter 02/27/2021   History of anemia due to chronic kidney disease 02/27/2021   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   Secondary hyperparathyroidism of  renal origin (HCC) 02/27/2021   Shortness of breath 02/27/2021   Type 2 diabetes mellitus with other diabetic kidney complication (HCC) 02/27/2021   Hypertensive urgency 02/18/2021   Type 2 diabetes mellitus (HCC) 02/17/2021   Acute on chronic combined systolic and diastolic CHF (congestive  heart failure) (HCC) 11/29/2020   Hypertensive crisis 11/27/2020   Stage 4 chronic kidney disease (HCC) 02/22/2020   Left ventricular dysfunction 04/27/2019   Hemoglobin A1C greater than 9%, indicating poor diabetic control 07/29/2018   LBBB (left bundle branch block)    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 Authorizing Provider  acetaminophen (TYLENOL) 500 MG tablet Take 1 tablet (500 mg total) by mouth every 8 (eight) hours as needed for mild pain. 05/28/22   Leroy Sea, MD  amLODipine (NORVASC) 10 MG tablet Take 1 tablet (10 mg total) by mouth daily. 05/26/22   Leroy Sea, MD  ASPIRIN LOW DOSE 81 MG tablet Take 1 tablet (81 mg total) by mouth daily. 05/28/22   Leroy Sea, MD  benzonatate (TESSALON) 100 MG capsule Take 1 capsule (100 mg total) by mouth every 8 (eight) hours. 09/09/22   Sabas Sous, MD  calcium acetate (PHOSLO) 667 MG capsule Take 3 capsules (2,001 mg total) by mouth 3 (three) times daily. 05/28/22   Leroy Sea, MD  carvedilol (COREG) 6.25 MG tablet Take 1 tablet (6.25 mg total) by mouth 2 (two) times daily with a meal. 05/28/22   Leroy Sea, MD  Fingerstix Lancets MISC Use as directed up to 4 times daily. 05/28/22   Leroy Sea, MD  glucose blood test strip Use to check fasting blood sugar once daily. 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 Insulin dependent 11/29/20   Hollice Espy, MD  glucose blood test strip Use as directed up to 4 times daily 05/28/22   Leroy Sea, MD  insulin aspart (NOVOLOG) 100 UNIT/ML FlexPen Before each meal 3 times a day, 140-199 - 2 units, 200-250 - 4 units, 251-299 - 6 units,  300-349  - 8 units,  350 or above 10 units. 05/28/22   Leroy Sea, MD  insulin glargine (LANTUS SOLOSTAR) 100 UNIT/ML Solostar Pen Inject 35 Units into the skin daily.Not to exceed 100 units per day. 06/19/22     Insulin Pen Needle 32G X 4 MM MISC Use with insulin pens 05/28/22   Leroy Sea, MD  Insulin Syringe-Needle U-100 25G X 1" 1 ML MISC For 4 times a day insulin SQ, 1 month supply. Diagnosis E11.65 05/28/22   Leroy Sea, MD  isosorbide-hydrALAZINE (BIDIL) 20-37.5 MG tablet Take 1 tablet by mouth 3 (three) times daily. 07/20/22     oxyCODONE-acetaminophen (PERCOCET/ROXICET) 5-325 MG tablet Take 1 tablet by mouth every 12 (twelve) hours as needed for severe pain. 07/04/22   Derwood Kaplan, MD  PROTONIX 40 MG tablet Take 1 tablet (40 mg total) by mouth daily. 05/25/22   Leroy Sea, MD  sevelamer carbonate (RENVELA) 800 MG tablet Take 2 tablets (1,600 mg total) by mouth 3 (three) times daily with meals. May also take 1 tablet (800 mg total) 2 (two) times daily as needed with snacks. 05/28/22  Leroy Sea, MD  gabapentin (NEURONTIN) 300 MG capsule Take 1 capsule (300 mg total) by mouth 3 (three) times daily. Patient not taking: Reported on 07/27/2019 07/29/18 08/30/19  Kallie Locks, FNP  sildenafil (VIAGRA) 25 MG tablet Take 1 tablet (25 mg total) by mouth as needed for erectile dysfunction. for erectile dysfunction. Patient not taking: Reported on 08/11/2020 10/13/19 11/27/20  Kallie Locks, FNP                                                                                                                                    Past Surgical History Past Surgical History:  Procedure Laterality Date   ARM SURGERY     AV FISTULA PLACEMENT Right 02/20/2021   Procedure: RIGHT ARTERIOVENOUS GRAFT CREATION;  Surgeon: Chuck Hint, MD;  Location: [facility] OR;  Service: Vascular;  Laterality: Right;   INSERTION OF DIALYSIS CATHETER Left 02/20/2021   Procedure: INSERTION OF DIALYSIS  CATHETER USING PALINDROME CATHETER;  Surgeon: Chuck Hint, MD;  Location: [facility] OR;  Service: Vascular;  Laterality: Left;   IR AV DIALY SHUNT INTRO NEEDLE/INTRACATH INITIAL W/PTA/IMG RIGHT Right 11/06/2021   IR FLUORO GUIDE CV LINE RIGHT  09/12/2022   IR REMOVAL TUN CV CATH W/O FL  07/27/2021   IR THROMBECTOMY AV FISTULA W/THROMBOLYSIS INC/SHUNT/IMG RIGHT  09/11/2022   IR US GUIDE VASC ACCESS RIGHT  11/06/2021   IR US GUIDE VASC ACCESS RIGHT  09/11/2022   IR US GUIDE VASC ACCESS RIGHT  09/12/2022   ULTRASOUND GUIDANCE FOR VASCULAR ACCESS  02/20/2021   Procedure: ULTRASOUND GUIDANCE FOR VASCULAR ACCESS;  Surgeon: Chuck Hint, MD;  Location: [facility] OR;  Service: Vascular;;   Family History Family History  Problem Relation Age of Onset   Hypertension Mother 55   Cancer Mother        bladder cancer   Alcohol abuse Father    Hypertension Brother     Social History Social History   Tobacco Use   Smoking status: Never   Smokeless tobacco: Never  Vaping Use   Vaping Use: Never used  Substance Use Topics   Alcohol use: No    Alcohol/week: 0.0 standard drinks of alcohol   Drug use: No   Allergies Sulfa antibiotics, Other, Tuberculin ppd, Bactrim [sulfamethoxazole-trimethoprim], and Cephalexin  Review of Systems Review of Systems  Constitutional:  Negative for chills and fever.  HENT:  Negative for facial swelling and trouble swallowing.   Eyes:  Negative for photophobia and visual disturbance.  Respiratory:  Negative for cough and shortness of breath.   Cardiovascular:  Negative for chest pain and palpitations.  Gastrointestinal:  Negative for abdominal pain, nausea and vomiting.  Endocrine: Negative for polydipsia and polyuria.  Genitourinary:  Negative for difficulty urinating and hematuria.  Musculoskeletal:  Positive for arthralgias. Negative for gait problem and joint swelling.  Skin:  Positive for wound. Negative for pallor and rash.  Neurological:  Negative for  syncope and headaches.  Psychiatric/Behavioral:  Negative for agitation and confusion.     Physical Exam Vital Signs  I have reviewed the triage vital signs BP (!) 161/83   Pulse 89   Temp 98 F (36.7 C) (Oral)   Resp 10   Ht 5\' 7"  (1.702 m)   Wt 72.6 kg   SpO2 96%   BMI 25.06 kg/m  Physical Exam Vitals and nursing note reviewed.  Constitutional:      General: He is not in acute distress.    Appearance: He is well-developed.  HENT:     Head: Normocephalic and atraumatic. No raccoon eyes, Battle's sign, right periorbital erythema or left periorbital erythema.     Jaw: There is normal jaw occlusion.     Comments: No external signs of head trauma     Right Ear: External ear normal.     Left Ear: External ear normal.     Mouth/Throat:     Mouth: Mucous membranes are moist.  Eyes:     General: No scleral icterus.    Extraocular Movements: Extraocular movements intact.     Pupils: Pupils are equal, round, and reactive to light.  Cardiovascular:     Rate and Rhythm: Normal rate and regular rhythm.     Pulses: Normal pulses.     Heart sounds: Normal heart sounds.  Pulmonary:     Effort: Pulmonary effort is normal. No respiratory distress.     Breath sounds: Normal breath sounds.  Abdominal:     General: Abdomen is flat.     Palpations: Abdomen is soft.     Tenderness: There is no abdominal tenderness.  Musculoskeletal:        General: Normal range of motion.     Cervical back: No rigidity.     Right lower leg: No edema.     Left lower leg: No edema.  Skin:    General: Skin is warm and dry.     Capillary Refill: Capillary refill takes less than 2 seconds.       Neurological:     Mental Status: He is alert and oriented to person, place, and time.     GCS: GCS eye subscore is 4. GCS verbal subscore is 5. GCS motor subscore is 6.     Cranial Nerves: No dysarthria or facial asymmetry.     Gait: Gait normal.  Psychiatric:        Mood and Affect: Mood normal.         Behavior: Behavior normal.     ED Results and Treatments Labs (all labs ordered are listed, but only abnormal results are displayed) Labs Reviewed  CBC WITH DIFFERENTIAL/PLATELET - Abnormal; Notable for the following components:      Result Value   RBC 3.16 (*)    Hemoglobin 8.4 (*)    HCT 28.7 (*)    MCHC 29.3 (*)    RDW 16.4 (*)    Platelets 454 (*)    All other components within normal limits  BASIC METABOLIC PANEL - Abnormal; Notable for the following components:   Chloride 97 (*)    CO2 21 (*)    Glucose, Bld 151 (*)    BUN 60 (*)    Creatinine, Ser 9.49 (*)    Calcium 8.1 (*)    GFR, Estimated 6 (*)    Anion gap 18 (*)    All other components within normal limits  CBG MONITORING, ED - Abnormal; Notable for the  following components:   Glucose-Capillary 170 (*)    All other components within normal limits  CULTURE, BLOOD (ROUTINE X 2)  CULTURE, BLOOD (ROUTINE X 2)  MRSA CULTURE  LACTIC ACID, PLASMA  VANCOMYCIN, RANDOM  CBC WITH DIFFERENTIAL/PLATELET  COMPREHENSIVE METABOLIC PANEL  MAGNESIUM  MAGNESIUM  PHOSPHORUS  HEPATITIS B SURFACE ANTIGEN  HEPATITIS B SURFACE ANTIBODY, QUANTITATIVE  PROTIME-INR  BLOOD GAS, VENOUS  SALICYLATE LEVEL  PARATHYROID HORMONE, INTACT (NO CA)  IRON AND TIBC  FERRITIN                                                                                                                          Radiology DG Knee Complete 4 Views Right  Result Date: 10/20/2022 CLINICAL DATA:  Ground level fall.  Right knee pain. EXAM: RIGHT KNEE - COMPLETE 4+ VIEW COMPARISON:  07/11/2022. FINDINGS: No fracture.  No bone lesion. 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 Knee joint normally spaced and aligned. No degenerative/arthropathic changes. No joint effusion. Soft tissues are unremarkable. IMPRESSION: 1. No fracture or acute finding. Electronically Signed   By: Amie Portland M.D.   On: 10/20/2022 14:47   DG Elbow  Complete Right  Result Date: 10/20/2022 CLINICAL DATA:  Ground level fall.  Elbow pain. EXAM: RIGHT ELBOW - COMPLETE 3+ VIEW COMPARISON:  03/06/2010. FINDINGS: No acute fracture. Prosthetic radial head appears well seated and aligned. Narrowing of the humeral olecranon joint space with mild associated subchondral sclerosis and marginal osteophytes. No convincing joint effusion. Forearm AV fistula. IMPRESSION: 1. No fracture or acute finding. 2. 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 Well seated and positioned radial head prosthesis. Electronically Signed   By: Amie Portland M.D.   On: 10/20/2022 14:45   DG Chest Portable 1 View  Result Date: 10/20/2022 CLINICAL DATA:  Pain.  Fall.  Dialysis patient. EXAM: PORTABLE CHEST 1 VIEW COMPARISON:  September 17, 2022 FINDINGS: Stable cardiomegaly. No pneumothorax. No overt edema, nodule, mass, or infiltrate. No acute abnormalities are noted. A nonacute fracture of the distal left clavicle is again identified. Degenerative changes identified in the shoulders. IMPRESSION: 1. No active disease. 2. Nonacute fracture of the distal left clavicle. Electronically Signed   By: Gerome Sam III M.D.   On: 10/20/2022 14:43    Pertinent labs & imaging results that were available during my care of the patient were reviewed by me and considered in my medical decision making (see MDM for details).  Medications Ordered in ED Medications  acetaminophen (TYLENOL) tablet 650 mg (has no administration in time range)    Or  acetaminophen (TYLENOL) suppository 650 mg (has no administration in time range)  melatonin tablet 3 mg (has no administration in time range)  ondansetron (ZOFRAN) injection 4 mg (has no administration in time range)  Chlorhexidine Gluconate Cloth 2 % PADS 6 each (has no administration in time range)  calcitRIOL (ROCALTROL) capsule 0.25 mcg (has no administration in  time range)  insulin aspart (novoLOG) injection 0-6 Units (has no  administration in time range)  insulin glargine-yfgn (SEMGLEE) injection 7 Units (has no administration in time range)  calcium acetate (PHOSLO) capsule 2,001 mg (has no administration in time range)  carvedilol (COREG) tablet 6.25 mg (has no administration in time range)  oxyCODONE-acetaminophen (PERCOCET/ROXICET) 5-325 MG per tablet 1 tablet (has no administration in time range)  pantoprazole (PROTONIX) EC tablet 40 mg (has no administration in time range)  Tdap (BOOSTRIX) injection 0.5 mL (0.5 mLs Intramuscular Given 10/20/22 1832)                                                                                                                                     Procedures Procedures  (including critical care time)  Medical Decision Making / ED Course    Medical Decision Making:    DIALLO PAWLUS is a 52 y.o. male with past medical history as below, significant for ESRD on HD Tuesday Thursday Saturday, homeless, DM, noncompliance to medication regimen, poorly controlled hypertension, CHF who presents to the ED with complaint of fall.. The complaint involves an extensive differential diagnosis and also carries with it a high risk of complications and morbidity.  Serious etiology was considered. Ddx includes but is not limited to: Sprain, strain, dislocation, soft tissue injury, laceration, etc.  Complete initial physical exam performed, notably the patient  was no acute distress, no external evidence of head trauma on exam, HDS.    Reviewed and confirmed nursing documentation for past medical history, family history, social history.  Vital signs reviewed.    Clinical Course as of 10/20/22 2003  Sun Oct 20, 2022  1858 Spoke with Dr Earlene Plater with ID, will follow along, recommend continue vanc per pharmacy dosing. Pending vanco level [SG]  1858 Spoke w/ Dr Arlean Hopping who will see in consult to help arrange for HD while inpatient  [SG]    Clinical Course User Index [SG] Sloan Leiter, DO    Patient with abrasion to right elbow and skin tear to right knee on exam.  X-rays reviewed and are stable.  No fracture. Patient was recently admitted at OSH with MRSA bacteremia and left AMA prior to completion of IV antibiotic treatment. Last dialysis was Wednesday of last week, no significant respiratory distress, does not appear acutely volume overloaded on exam Abrasion to right elbow and skin tear to right knee, tetanus updated, wound care performed.  Does not require sutures Discussed with Dr. Arlean Hopping nephrology will arrange for dialysis tomorrow Discussed with ID Dr Earlene Plater in regards to treatment of bacteremia continuation from outside hospital Pharmacy to dose vancomycin ordered Ideally will receive vancomycin during HD tomorrow Plan admission, D/w Dr Arlean Hopping who accepts pt for admit    Additional history obtained: -Additional history obtained from ems -External records from outside source obtained and reviewed including: Chart review including previous notes, labs, imaging, consultation notes including  OSH records, home medications, prior labs and imaging   Lab Tests: -I ordered, reviewed, and interpreted labs.   The pertinent results include:   Labs Reviewed  CBC WITH DIFFERENTIAL/PLATELET - Abnormal; Notable for the following components:      Result Value   RBC 3.16 (*)    Hemoglobin 8.4 (*)    HCT 28.7 (*)    MCHC 29.3 (*)    RDW 16.4 (*)    Platelets 454 (*)    All other components within normal limits  BASIC METABOLIC PANEL - Abnormal; Notable for the following components:   Chloride 97 (*)    CO2 21 (*)    Glucose, Bld 151 (*)    BUN 60 (*)    Creatinine, Ser 9.49 (*)    Calcium 8.1 (*)    GFR, Estimated 6 (*)    Anion gap 18 (*)    All other components within normal limits  CBG MONITORING, ED - Abnormal; Notable for the following components:   Glucose-Capillary 170 (*)    All other components within normal limits  CULTURE, BLOOD (ROUTINE X 2)   CULTURE, BLOOD (ROUTINE X 2)  MRSA CULTURE  LACTIC ACID, PLASMA  VANCOMYCIN, RANDOM  CBC WITH DIFFERENTIAL/PLATELET  COMPREHENSIVE METABOLIC PANEL  MAGNESIUM  MAGNESIUM  PHOSPHORUS  HEPATITIS B SURFACE ANTIGEN  HEPATITIS B SURFACE ANTIBODY, QUANTITATIVE  PROTIME-INR  BLOOD GAS, VENOUS  SALICYLATE LEVEL  PARATHYROID HORMONE, INTACT (NO CA)  IRON AND TIBC  FERRITIN    Notable for creatinine elevated this with ESRD, BUN is 60, bicarb is 21 potassium 5; no leukocytosis, hemoglobin similar to baseline, vancomycin 14  EKG   EKG Interpretation  Date/Time:    Ventricular Rate:    PR Interval:    QRS Duration:   QT Interval:    QTC Calculation:   R Axis:     Text Interpretation:           Imaging Studies ordered: I ordered imaging studies including x-ray, right elbow x-ray, right knee x-ray I independently visualized the following imaging with scope of interpretation limited to determining acute life threatening conditions related to emergency care; findings noted above, significant for no gross fracture I independently visualized and interpreted imaging. I agree with the radiologist interpretation   Medicines ordered and prescription drug management: Meds ordered this encounter  Medications   Tdap (BOOSTRIX) injection 0.5 mL   OR Linked Order Group    acetaminophen (TYLENOL) tablet 650 mg    acetaminophen (TYLENOL) suppository 650 mg   melatonin tablet 3 mg   ondansetron (ZOFRAN) injection 4 mg   Chlorhexidine Gluconate Cloth 2 % PADS 6 each   calcitRIOL (ROCALTROL) capsule 0.25 mcg   insulin aspart (novoLOG) injection 0-6 Units    Order Specific Question:   Correction coverage:    Answer:   Very Sensitive (ESRD/Dialysis)    Order Specific Question:   CBG < 70:    Answer:   Implement Hypoglycemia Standing Orders and refer to Hypoglycemia Standing Orders sidebar report    Order Specific Question:   CBG 70 - 120:    Answer:   0 units    Order Specific Question:    CBG 121 - 150:    Answer:   0 units    Order Specific Question:   CBG 151 - 200:    Answer:   1 unit    Order Specific Question:   CBG 201-250:    Answer:   2 units  Order Specific Question:   CBG 251-300:    Answer:   3 units    Order Specific Question:   CBG 301-350:    Answer:   4 units    Order Specific Question:   CBG 351-400:    Answer:   5 units    Order Specific Question:   CBG > 400    Answer:   Give 6 units and call MD   insulin glargine-yfgn (SEMGLEE) injection 7 Units   calcium acetate (PHOSLO) capsule 2,001 mg   carvedilol (COREG) tablet 6.25 mg   oxyCODONE-acetaminophen (PERCOCET/ROXICET) 5-325 MG per tablet 1 tablet   pantoprazole (PROTONIX) EC tablet 40 mg    -I have reviewed the patients home medicines and have made adjustments as needed   Consultations Obtained: I requested consultation with the Dr. Arlean Hopping and Dr. Earlene Plater,  and discussed lab and imaging findings as well as pertinent plan - they recommend: admit, will follow   Cardiac Monitoring: The patient was maintained on a cardiac monitor.  I personally viewed and interpreted the cardiac monitored which showed an underlying rhythm of: NSR  Social Determinants of Health:  Diagnosis or treatment significantly limited by social determinants of health: intermittently homeless, med non compliance   Reevaluation: After the interventions noted above, I reevaluated the patient and found that they have improved  Co morbidities that complicate the patient evaluation  Past Medical History:  Diagnosis Date   Anxiety 02/2019   Anxiety 10/2019   Diabetes mellitus type II, uncontrolled 07/17/2006   Elevated alkaline phosphatase level 01/2019   ESSENTIAL HYPERTENSION 07/17/2006   Homelessness 10/13/2021   HYPERLIPIDEMIA 11/24/2008   Insomnia 02/2019   Left bundle branch block 02/22/2019   Lesion of lip 10/2019   Pneumonia 10/22/2011   Suicidal ideations 02/2019   Vitamin D deficiency 01/2019       Dispostion: Disposition decision including need for hospitalization was considered, and patient admitted to the hospital.    Final Clinical Impression(s) / ED Diagnoses Final diagnoses:  ESRD on dialysis [facility])  MRSA bacteremia  Fall, initial encounter     This chart was dictated using voice recognition software.  Despite best efforts to proofread,  errors can occur which can change the documentation meaning.    Sloan Leiter, DO 10/20/22 2003
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
## 2022-10-21 ENCOUNTER — Inpatient Hospital Stay (HOSPITAL_COMMUNITY): Payer: Medicaid Other

## 2022-10-21 ENCOUNTER — Other Ambulatory Visit (HOSPITAL_COMMUNITY): Payer: Medicaid Other

## 2022-10-21 DIAGNOSIS — M25521 Pain in right elbow: Secondary | ICD-10-CM | POA: Diagnosis not present

## 2022-10-21 DIAGNOSIS — Z7401 Bed confinement status: Secondary | ICD-10-CM | POA: Diagnosis not present

## 2022-10-21 DIAGNOSIS — E1122 Type 2 diabetes mellitus with diabetic chronic kidney disease: Secondary | ICD-10-CM | POA: Diagnosis present

## 2022-10-21 DIAGNOSIS — I76 Septic arterial embolism: Secondary | ICD-10-CM | POA: Diagnosis present

## 2022-10-21 DIAGNOSIS — I3139 Other pericardial effusion (noninflammatory): Secondary | ICD-10-CM | POA: Diagnosis present

## 2022-10-21 DIAGNOSIS — N189 Chronic kidney disease, unspecified: Secondary | ICD-10-CM | POA: Diagnosis not present

## 2022-10-21 DIAGNOSIS — Z5900 Homelessness unspecified: Secondary | ICD-10-CM | POA: Diagnosis not present

## 2022-10-21 DIAGNOSIS — W010XXA Fall on same level from slipping, tripping and stumbling without subsequent striking against object, initial encounter: Secondary | ICD-10-CM | POA: Diagnosis present

## 2022-10-21 DIAGNOSIS — N186 End stage renal disease: Secondary | ICD-10-CM | POA: Diagnosis present

## 2022-10-21 DIAGNOSIS — R079 Chest pain, unspecified: Secondary | ICD-10-CM | POA: Diagnosis not present

## 2022-10-21 DIAGNOSIS — M25561 Pain in right knee: Secondary | ICD-10-CM | POA: Diagnosis not present

## 2022-10-21 DIAGNOSIS — Z91158 Patient's noncompliance with renal dialysis for other reason: Secondary | ICD-10-CM | POA: Diagnosis not present

## 2022-10-21 DIAGNOSIS — K219 Gastro-esophageal reflux disease without esophagitis: Secondary | ICD-10-CM | POA: Diagnosis present

## 2022-10-21 DIAGNOSIS — I5021 Acute systolic (congestive) heart failure: Secondary | ICD-10-CM | POA: Diagnosis not present

## 2022-10-21 DIAGNOSIS — J340 Abscess, furuncle and carbuncle of nose: Secondary | ICD-10-CM

## 2022-10-21 DIAGNOSIS — I110 Hypertensive heart disease with heart failure: Secondary | ICD-10-CM | POA: Diagnosis not present

## 2022-10-21 DIAGNOSIS — E8729 Other acidosis: Secondary | ICD-10-CM | POA: Diagnosis not present

## 2022-10-21 DIAGNOSIS — E785 Hyperlipidemia, unspecified: Secondary | ICD-10-CM | POA: Diagnosis present

## 2022-10-21 DIAGNOSIS — I5042 Chronic combined systolic (congestive) and diastolic (congestive) heart failure: Secondary | ICD-10-CM | POA: Diagnosis not present

## 2022-10-21 DIAGNOSIS — Z992 Dependence on renal dialysis: Secondary | ICD-10-CM | POA: Diagnosis not present

## 2022-10-21 DIAGNOSIS — I088 Other rheumatic multiple valve diseases: Secondary | ICD-10-CM | POA: Diagnosis not present

## 2022-10-21 DIAGNOSIS — W19XXXA Unspecified fall, initial encounter: Secondary | ICD-10-CM | POA: Diagnosis not present

## 2022-10-21 DIAGNOSIS — T827XXA Infection and inflammatory reaction due to other cardiac and vascular devices, implants and grafts, initial encounter: Secondary | ICD-10-CM | POA: Diagnosis present

## 2022-10-21 DIAGNOSIS — Y832 Surgical operation with anastomosis, bypass or graft as the cause of abnormal reaction of the patient, or of later complication, without mention of misadventure at the time of the procedure: Secondary | ICD-10-CM | POA: Diagnosis present

## 2022-10-21 DIAGNOSIS — Z23 Encounter for immunization: Secondary | ICD-10-CM | POA: Diagnosis not present

## 2022-10-21 DIAGNOSIS — Z8249 Family history of ischemic heart disease and other diseases of the circulatory system: Secondary | ICD-10-CM | POA: Diagnosis not present

## 2022-10-21 DIAGNOSIS — I10 Essential (primary) hypertension: Secondary | ICD-10-CM | POA: Diagnosis not present

## 2022-10-21 DIAGNOSIS — B9562 Methicillin resistant Staphylococcus aureus infection as the cause of diseases classified elsewhere: Secondary | ICD-10-CM | POA: Diagnosis present

## 2022-10-21 DIAGNOSIS — M898X9 Other specified disorders of bone, unspecified site: Secondary | ICD-10-CM | POA: Diagnosis present

## 2022-10-21 DIAGNOSIS — I132 Hypertensive heart and chronic kidney disease with heart failure and with stage 5 chronic kidney disease, or end stage renal disease: Secondary | ICD-10-CM | POA: Diagnosis present

## 2022-10-21 DIAGNOSIS — I447 Left bundle-branch block, unspecified: Secondary | ICD-10-CM | POA: Diagnosis present

## 2022-10-21 DIAGNOSIS — I5043 Acute on chronic combined systolic (congestive) and diastolic (congestive) heart failure: Secondary | ICD-10-CM | POA: Diagnosis present

## 2022-10-21 DIAGNOSIS — L02414 Cutaneous abscess of left upper limb: Secondary | ICD-10-CM | POA: Diagnosis present

## 2022-10-21 DIAGNOSIS — E119 Type 2 diabetes mellitus without complications: Secondary | ICD-10-CM | POA: Diagnosis not present

## 2022-10-21 DIAGNOSIS — G4700 Insomnia, unspecified: Secondary | ICD-10-CM | POA: Diagnosis present

## 2022-10-21 DIAGNOSIS — I509 Heart failure, unspecified: Secondary | ICD-10-CM | POA: Diagnosis not present

## 2022-10-21 DIAGNOSIS — D631 Anemia in chronic kidney disease: Secondary | ICD-10-CM | POA: Diagnosis present

## 2022-10-21 DIAGNOSIS — Z794 Long term (current) use of insulin: Secondary | ICD-10-CM | POA: Diagnosis not present

## 2022-10-21 DIAGNOSIS — E8779 Other fluid overload: Secondary | ICD-10-CM | POA: Diagnosis not present

## 2022-10-21 DIAGNOSIS — I361 Nonrheumatic tricuspid (valve) insufficiency: Secondary | ICD-10-CM | POA: Diagnosis not present

## 2022-10-21 DIAGNOSIS — I428 Other cardiomyopathies: Secondary | ICD-10-CM | POA: Diagnosis present

## 2022-10-21 DIAGNOSIS — T827XXD Infection and inflammatory reaction due to other cardiac and vascular devices, implants and grafts, subsequent encounter: Secondary | ICD-10-CM | POA: Diagnosis not present

## 2022-10-21 DIAGNOSIS — R531 Weakness: Secondary | ICD-10-CM | POA: Diagnosis not present

## 2022-10-21 DIAGNOSIS — E872 Acidosis, unspecified: Secondary | ICD-10-CM | POA: Diagnosis present

## 2022-10-21 DIAGNOSIS — R7881 Bacteremia: Secondary | ICD-10-CM | POA: Diagnosis present

## 2022-10-21 DIAGNOSIS — I120 Hypertensive chronic kidney disease with stage 5 chronic kidney disease or end stage renal disease: Secondary | ICD-10-CM | POA: Diagnosis not present

## 2022-10-21 LAB — IRON AND TIBC
Iron: 37 ug/dL — ABNORMAL LOW (ref 45–182)
Saturation Ratios: 13 % — ABNORMAL LOW (ref 17.9–39.5)
TIBC: 297 ug/dL (ref 250–450)
UIBC: 260 ug/dL

## 2022-10-21 LAB — COMPREHENSIVE METABOLIC PANEL
ALT: 27 U/L (ref 0–44)
AST: 22 U/L (ref 15–41)
Albumin: 2.7 g/dL — ABNORMAL LOW (ref 3.5–5.0)
Alkaline Phosphatase: 223 U/L — ABNORMAL HIGH (ref 38–126)
Anion gap: 14 (ref 5–15)
BUN: 65 mg/dL — ABNORMAL HIGH (ref 6–20)
CO2: 22 mmol/L (ref 22–32)
Calcium: 7.7 mg/dL — ABNORMAL LOW (ref 8.9–10.3)
Chloride: 102 mmol/L (ref 98–111)
Creatinine, Ser: 10.26 mg/dL — ABNORMAL HIGH (ref 0.61–1.24)
GFR, Estimated: 6 mL/min — ABNORMAL LOW (ref >60)
Glucose, Bld: 210 mg/dL — ABNORMAL HIGH (ref 70–99)
Potassium: 4.7 mmol/L (ref 3.5–5.1)
Sodium: 138 mmol/L (ref 135–145)
Total Bilirubin: 0.5 mg/dL (ref 0.3–1.2)
Total Protein: 7 g/dL (ref 6.5–8.1)

## 2022-10-21 LAB — GLUCOSE, CAPILLARY
Glucose-Capillary: 143 mg/dL — ABNORMAL HIGH (ref 70–99)
Glucose-Capillary: 162 mg/dL — ABNORMAL HIGH (ref 70–99)
Glucose-Capillary: 51 mg/dL — ABNORMAL LOW (ref 70–99)
Glucose-Capillary: 73 mg/dL (ref 70–99)
Glucose-Capillary: 106 mg/dL — ABNORMAL HIGH (ref 70–99)

## 2022-10-21 LAB — CBC WITH DIFFERENTIAL/PLATELET
Abs Immature Granulocytes: 0.03 10*3/uL (ref 0.00–0.07)
Basophils Absolute: 0.1 10*3/uL (ref 0.0–0.1)
Basophils Relative: 1 %
Eosinophils Absolute: 0.3 10*3/uL (ref 0.0–0.5)
Eosinophils Relative: 4 %
HCT: 25.6 % — ABNORMAL LOW (ref 39.0–52.0)
Hemoglobin: 7.9 g/dL — ABNORMAL LOW (ref 13.0–17.0)
Immature Granulocytes: 0 %
Lymphocytes Relative: 14 %
Lymphs Abs: 1.1 10*3/uL (ref 0.7–4.0)
MCH: 27.2 pg (ref 26.0–34.0)
MCHC: 30.9 g/dL (ref 30.0–36.0)
MCV: 88.3 fL (ref 80.0–100.0)
Monocytes Absolute: 0.9 10*3/uL (ref 0.1–1.0)
Monocytes Relative: 11 %
Neutro Abs: 5.6 10*3/uL (ref 1.7–7.7)
Neutrophils Relative %: 70 %
Platelets: 435 10*3/uL — ABNORMAL HIGH (ref 150–400)
RBC: 2.9 MIL/uL — ABNORMAL LOW (ref 4.22–5.81)
RDW: 16.3 % — ABNORMAL HIGH (ref 11.5–15.5)
WBC: 7.9 10*3/uL (ref 4.0–10.5)
nRBC: 0 % (ref 0.0–0.2)

## 2022-10-21 LAB — BLOOD GAS, VENOUS
Acid-Base Excess: 0.5 mmol/L (ref 0.0–2.0)
Bicarbonate: 26 mmol/L (ref 20.0–28.0)
Drawn by: 7104
O2 Saturation: 95.6 %
Patient temperature: 37
pCO2, Ven: 44 mmHg (ref 44–60)
pH, Ven: 7.38 (ref 7.25–7.43)
pO2, Ven: 73 mmHg — ABNORMAL HIGH (ref 32–45)

## 2022-10-21 LAB — CULTURE, BLOOD (ROUTINE X 2): Culture: NO GROWTH

## 2022-10-21 LAB — PHOSPHORUS: Phosphorus: 6.2 mg/dL — ABNORMAL HIGH (ref 2.5–4.6)

## 2022-10-21 LAB — MAGNESIUM: Magnesium: 2.5 mg/dL — ABNORMAL HIGH (ref 1.7–2.4)

## 2022-10-21 LAB — FERRITIN: Ferritin: 371 ng/mL — ABNORMAL HIGH (ref 24–336)

## 2022-10-21 MED ORDER — DARBEPOETIN ALFA 150 MCG/0.3ML IJ SOSY
150.0000 ug | PREFILLED_SYRINGE | Freq: Once | INTRAMUSCULAR | Status: AC
  Administered 2022-10-21: 150 ug via SUBCUTANEOUS
  Filled 2022-10-21: qty 0.3

## 2022-10-21 MED ORDER — VANCOMYCIN HCL 750 MG/150ML IV SOLN
750.0000 mg | Freq: Once | INTRAVENOUS | Status: AC
  Administered 2022-10-21: 750 mg via INTRAVENOUS

## 2022-10-21 MED ORDER — HEPARIN SODIUM (PORCINE) 5000 UNIT/ML IJ SOLN
5000.0000 [IU] | Freq: Three times a day (TID) | INTRAMUSCULAR | Status: DC
  Administered 2022-10-21 – 2022-10-26 (×12): 5000 [IU] via SUBCUTANEOUS
  Filled 2022-10-21 (×14): qty 1

## 2022-10-21 MED ORDER — GLUCOSE 40 % PO GEL
2.0000 | ORAL | Status: AC
  Administered 2022-10-21: 62 g via ORAL
  Filled 2022-10-21: qty 2.42

## 2022-10-21 NOTE — TOC Progression Note (Signed)
Transition of Care [facility]) - Progression Note    Patient Details  Name: [name] MRN: [id number] Date of Birth: [birth year]  Transition of Care [facility]) CM/SW Contact  Gordy Clement, RN Phone Number: 10/21/2022, 2:39 PM  Clinical Narrative:    TOC following along for DC needs  Patient currently on IVABX projected to need for 6 weeks. [facility] notified and will follow.   TOC will continue to follow patient for any additional discharge needs            Expected Discharge Plan and Services                                               Social Determinants of Health (SDOH) Interventions SDOH Screenings   Food Insecurity: Food Insecurity Present (05/08/2022)  Housing: High Risk (10/21/2022)  Transportation Needs: Unmet Transportation Needs (10/21/2022)  Utilities: Not At Risk (10/21/2022)  Depression (PHQ2-9): Low Risk  (10/13/2019)  Financial Resource Strain: High Risk (05/08/2022)  Tobacco Use: Low Risk  (10/20/2022)    Readmission Risk Interventions    11/07/2021    4:21 PM 02/19/2021    3:52 PM  Readmission Risk Prevention Plan  Transportation Screening Complete Complete  Medication Review [employer]) Complete Complete  PCP or Specialist appointment within 3-5 days of discharge Complete Complete  HRI or Home Care Consult Complete Complete  SW Recovery Care/Counseling Consult Complete Complete  Palliative Care Screening Not Applicable Not Applicable  Skilled Nursing Facility Not Applicable Not Applicable

## 2022-10-21 NOTE — Progress Notes (Signed)
PROGRESS NOTE    [name]  ZOX:[id number] DOB: [birth year] DOA: 10/20/2022 PCP: [name]   Chief Complaint  Patient presents with   Fall    Brief Narrative:  [name] is a 52 y.o. male with medical history significant for end-stage renal disease on hemodialysis, type 2 diabetes mellitus, essential pretension, anemia of chronic kidney disease associated baseline hemoglobin range 8-12, recent hospitalization for MRSA bacteremia, who is admitted to [facility] on 10/20/2022 with anion gap metabolic acidosis after presenting from home to [facility] ED complaining of fall.  Patient recently left AMA from hospital in [place] for which he was diagnosed with MRSA bacteremia, and right AVG function requiring declotting process, he left before finishing his treatment.  Assessment & Plan:   Principal Problem:   High anion gap metabolic acidosis Active Problems:   End-stage renal disease on hemodialysis (HCC)   History of anemia due to chronic kidney disease   Essential hypertension   DM2 (diabetes mellitus, type 2) (HCC)   MRSA bacteremia   Fall   GERD (gastroesophageal reflux disease)   MRSA bacteremia -Has been diagnosed on OSH in [place] in May 2024, apparently patient has been hospitalized there and 3 different hospital where he left AMA. -Continue with IV vancomycin for now. -Reviewing records at OSH, initial recommendation were for IV antibiotics 4 to 6 weeks. -Follow-up blood cultures. -With further ID recommendations.  ESRD -Renal consulted, report last HD was on Thursday, HD per renal -Status post declotting of right forearm AVG, has good bruit is currently  Hypertension -  outpatient antihypertensive regimen including Coreg, Norvasc, isosorbide hydralazine,Hold Norvasc and isosorbide/hydralazine for now, blood pressure mildly elevated, but this should improve after HPE.  Anemia of chronic kidney disease -IV iron and erythropoietin per renal  Diabetes  mellitus type 2 -Regimen include Lantus 35 units, most recent A1c 7 point 12/08/2022 -He is on low-dose Lantus here and insulin sliding scale.  GERD -Continue with PPI   DVT prophylaxis: [place] heaprin Code Status: Full Family Communication: none at bedside     Consultants:  Renal ID  Subjective:  Denies any fever or chills today.  Objective: Vitals:   10/20/22 1716 10/20/22 2052 10/21/22 0000 10/21/22 0822  BP:  (!) 183/94 (!) 172/89 (!) 176/85  Pulse:  90 86 87  Resp:  12 15 15   Temp: 98 F (36.7 C) 97.9 F (36.6 C)  98.3 F (36.8 C)  TempSrc: Oral Oral    SpO2:  100% 98%   Weight:      Height:       No intake or output data in the 24 hours ending 10/21/22 1121 Filed Weights   10/20/22 1327  Weight: 72.6 kg    Examination:  Awake Alert, Oriented X 3, No new F.N deficits, Normal affect Symmetrical Chest wall movement, Good air movement bilaterally, CTAB RRR,No Gallops,Rubs or new Murmurs, No Parasternal Heave +ve B.Sounds, Abd Soft, No tenderness, No rebound - guarding or rigidity. No Cyanosis, bilateral lower extremity edema present, with woody appearance.  Right AVG is bandaged   Data Reviewed: I have personally reviewed following labs and imaging studies  CBC: Recent Labs  Lab 10/20/22 1708 10/21/22 0549  WBC 8.5 7.9  NEUTROABS 6.1 5.6  HGB 8.4* 7.9*  HCT 28.7* 25.6*  MCV 90.8 88.3  PLT 454* 435*    Basic Metabolic Panel: Recent Labs  Lab 10/20/22 1708 10/21/22 0549  NA 136 138  K 5.0 4.7  CL 97* 102  CO2 21* 22  GLUCOSE 151* 210*  BUN 60* 65*  CREATININE 9.49* 10.26*  CALCIUM 8.1* 7.7*  MG 2.6* 2.5*  PHOS  --  6.2*    GFR: Estimated Creatinine Clearance: 8.1 mL/min (A) (by C-G formula based on SCr of 10.26 mg/dL (H)).  Liver Function Tests: Recent Labs  Lab 10/21/22 0549  AST 22  ALT 27  ALKPHOS 223*  BILITOT 0.5  PROT 7.0  ALBUMIN 2.7*    CBG: Recent Labs  Lab 10/20/22 1339 10/20/22 2132 10/21/22 0820  GLUCAP  170* 118* 143*     Recent Results (from the past 240 hour(s))  Blood culture (routine x 2)     Status: None (Preliminary result)   Collection Time: 10/20/22  6:43 PM   Specimen: BLOOD LEFT ARM  Result Value Ref Range Status   Specimen Description BLOOD LEFT ARM  Final   Special Requests   Final    BOTTLES DRAWN AEROBIC AND ANAEROBIC Blood Culture results may not be optimal due to an inadequate volume of blood received in culture bottles   Culture   Final    NO GROWTH < 24 HOURS Performed at [facility], [address], [place], [place] [address]    Report Status PENDING  Incomplete  Blood culture (routine x 2)     Status: None (Preliminary result)   Collection Time: 10/20/22  9:47 PM   Specimen: BLOOD LEFT HAND  Result Value Ref Range Status   Specimen Description BLOOD LEFT HAND  Final   Special Requests   Final    BOTTLES DRAWN AEROBIC AND ANAEROBIC Blood Culture adequate volume   Culture   Final    NO GROWTH < 12 HOURS Performed at [facility], [address], [place], [place] [address]    Report Status PENDING  Incomplete         Radiology Studies: DG Knee Complete 4 Views Right  Result Date: 10/20/2022 CLINICAL DATA:  Ground level fall.  Right knee pain. EXAM: RIGHT KNEE - COMPLETE 4+ VIEW COMPARISON:  07/11/2022. FINDINGS: No fracture.  No bone lesion. 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 Knee joint normally spaced and aligned. No degenerative/arthropathic changes. No joint effusion. Soft tissues are unremarkable. IMPRESSION: 1. No fracture or acute finding. Electronically Signed   By: Amie Portland M.D.   On: 10/20/2022 14:47   DG Elbow Complete Right  Result Date: 10/20/2022 CLINICAL DATA:  Ground level fall.  Elbow pain. EXAM: RIGHT ELBOW - COMPLETE 3+ VIEW COMPARISON:  03/06/2010. FINDINGS: No acute fracture. Prosthetic radial head appears well seated and aligned. Narrowing of the humeral olecranon joint space  with mild associated subchondral sclerosis and marginal osteophytes. No convincing joint effusion. Forearm AV fistula. IMPRESSION: 1. No fracture or acute finding. 2. 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 Well seated and positioned radial head prosthesis. Electronically Signed   By: Amie Portland M.D.   On: 10/20/2022 14:45   DG Chest Portable 1 View  Result Date: 10/20/2022 CLINICAL DATA:  Pain.  Fall.  Dialysis patient. EXAM: PORTABLE CHEST 1 VIEW COMPARISON:  September 17, 2022 FINDINGS: Stable cardiomegaly. No pneumothorax. No overt edema, nodule, mass, or infiltrate. No acute abnormalities are noted. A nonacute fracture of the distal left clavicle is again identified. Degenerative changes identified in the shoulders. IMPRESSION: 1. No active disease. 2. Nonacute fracture of the distal left clavicle. Electronically Signed   By: Gerome Sam III  M.D.   On: 10/20/2022 14:43        Scheduled Meds:  [START ON 10/22/2022] calcitRIOL  0.25 mcg Oral Q T,Th,Sat-1800   calcium acetate  2,001 mg Oral TID WC   carvedilol  6.25 mg Oral BID WC   Chlorhexidine Gluconate Cloth  6 each Topical Q0600   darbepoetin (ARANESP) injection - DIALYSIS  150 mcg Subcutaneous Once   insulin aspart  0-6 Units Subcutaneous TID WC   insulin glargine-yfgn  7 Units Subcutaneous QHS   pantoprazole  40 mg Oral Daily   Continuous Infusions:  [START ON 10/22/2022] vancomycin     vancomycin       LOS: 0 days       Huey Bienenstock, MD [facility]   To contact the attending provider between 7A-7P or the covering provider during after hours 7P-7A, please log into the web site [email or web address] and access using universal  password for that web site. If you do not have the password, please call the hospital operator.  10/21/2022, 11:21 AM

## 2022-10-21 NOTE — Consult Note (Signed)
Date of Admission:  10/20/2022          Reason for Consult: MRSA bacteremic patient on HD via AVG   Referring Provider: [facility] and Huey Bienenstock, MD   Assessment:  MRSA bacteremia ? Source nasal abscess +/- AVG was mentioned in some of the notes but I dont see why he would have had it declotted if it was thought to have been source of his infection Rule out endocarditis Rule out spread of infection to other metastatic sites   Plan:  Continue vancomycin Follow-up culture data from here TTE, TEE if he will stay and allow [place] to pursue it Review records shed further light re his AVG, does he need more definitive imaging?   Principal Problem:   High anion gap metabolic acidosis Active Problems:   History of anemia due to chronic kidney disease   DM2 (diabetes mellitus, type 2) (HCC)   End-stage renal disease on hemodialysis (HCC)   Essential hypertension   MRSA bacteremia   Fall   GERD (gastroesophageal reflux disease)   Scheduled Meds:  [START ON 10/22/2022] calcitRIOL  0.25 mcg Oral Q T,Th,Sat-1800   calcium acetate  2,001 mg Oral TID WC   carvedilol  6.25 mg Oral BID WC   Chlorhexidine Gluconate Cloth  6 each Topical Q0600   darbepoetin (ARANESP) injection - DIALYSIS  150 mcg Subcutaneous Once   heparin injection (subcutaneous)  5,000 Units Subcutaneous Q8H   insulin aspart  0-6 Units Subcutaneous TID WC   insulin glargine-yfgn  7 Units Subcutaneous QHS   pantoprazole  40 mg Oral Daily   Continuous Infusions:  [START ON 10/22/2022] vancomycin     vancomycin     PRN Meds:.acetaminophen **OR** acetaminophen, melatonin, ondansetron (ZOFRAN) IV, oxyCODONE-acetaminophen  HPI: [name] is a 52 y.o. male M  hx of  end-stage renal disease on dialysis, DM who hospitalized several times in [place] while he was visiting there.  During one of his admissions he was found to have a nasal abscess.  Ultimately he was found to be bacteremic with  MRSA.  This was apparently thought to have been associated with an AV fistula ? with complications of septic embolization? I can see that his AVG was declotted x 2.  I do not find any specific images that are mentioning septic embolization  I can see that the patient did have CT humerus that showed apparently widespread edema and reactive appearing axillary lymphadenopathy with a tubular subcutaneous BIKTARVY structure in the dorsal distal anteromedial arm.  There is also a nonacute fracture of the distal left clavicle seen.  I do not see full documentation of this area being infected though there is mention about it having been declotting.   He was on vancomycin but then left the hospital in [place] AGAINST MEDICAL ADVICE.  When he arrived in [place] he tripped and fell. He was brought to the ER and admitted for further workup and evaluation  He is currently on vancomycin.  Blood cultures taken on admission no growth so far.  He is not very good at providing me with much information about his recent illness.  We do need greater clarity re the AVG. If it were considered infected I would not have thought it wwould have been declotted.  I would treat him with 6 weeks of IV vancomycin without interruption and presume endocarditis.    I have personally spent 80 minutes involved in face-to-face and non-face-to-face  activities for this patient on the day of the visit. Professional time spent includes the following activities: Preparing to see the patient (review of tests), Obtaining and/or reviewing separately obtained history (admission/discharge record), Performing a medically appropriate examination and/or evaluation , Ordering medications/tests/procedures, referring and communicating with other health care professionals, Documenting clinical information in the EMR, Independently interpreting results (not separately reported), Communicating results to the  patient/family/caregiver, Counseling and educating the patient/family/caregiver and Care coordination (not separately reported).      Review of Systems: Review of Systems  Unable to perform ROS: Acuity of condition    Past Medical History:  Diagnosis Date   Anxiety 02/2019   Anxiety 10/2019   Diabetes mellitus type II, uncontrolled 07/17/2006   Elevated alkaline phosphatase level 01/2019   ESSENTIAL HYPERTENSION 07/17/2006   Homelessness 10/13/2021   HYPERLIPIDEMIA 11/24/2008   Insomnia 02/2019   Left bundle branch block 02/22/2019   Lesion of lip 10/2019   Pneumonia 10/22/2011   Suicidal ideations 02/2019   Vitamin D deficiency 01/2019    Social History   Tobacco Use   Smoking status: Never   Smokeless tobacco: Never  Vaping Use   Vaping Use: Never used  Substance Use Topics   Alcohol use: No    Alcohol/week: 0.0 standard drinks of alcohol   Drug use: No    Family History  Problem Relation Age of Onset   Hypertension Mother 65   Cancer Mother        bladder cancer   Alcohol abuse Father    Hypertension Brother    Allergies  Allergen Reactions   Sulfa Antibiotics Rash    Severe rash.  Severe rash.     Severe rash.   Other Other (See Comments)    Patient states he is allergic to the TB test - unknown reaction during childhood    Tuberculin Ppd     Other Reaction(s): Other (See Comments)   Bactrim [Sulfamethoxazole-Trimethoprim] Rash   Cephalexin Other (See Comments) and Rash    Patient with severe drug reaction including exfoliating skin rash and hypotension after being prescribed both cephalexin and sulfamethoxazole-trimethoprim simultaneously. Favor SMX as much more likely culprit as patient had tolerated other cephalosporins in the past, but cannot say with complete certainty that this was not related to cephalexin.  Other Reaction(s): Other, Other (See Comments)  Patient with severe drug reaction including exfoliating skin rash and hypotension after  being prescribed both cephalexin and sulfamethoxazole-trimethoprim simultaneously. Favor SMX as much more likely culprit as patient had tolerated other cephalosporins in the past, but cannot say with complete certainty that this was not related to cephalexin.   "Patient with severe drug reaction including exfoliating skin rash and hypotension after being prescribed both cephalexin and sulfamethoxazole-trimethoprim simultaneously. Favor SMX as much more likely culprit as patient had tolerated other cephalosporins in the past, but cannot say with complete certainty that this was not related to cephalexin." As noted in [facility]   "Patient with severe drug reaction including exfoliating skin rash and hypotension after being prescribed both cephalexin and sulfamethoxazole-trimethoprim simultaneously. Favor SMX as much more likely culprit as patient had tolerated other cephalosporins in the past, but cannot say with complete certainty that this was not related to cephalexin." As noted in [facility]   "Patient with severe drug reaction including exfoliating skin rash and hypotension after being prescribed both cephalexin and sulfamethoxazole-trimethoprim simultaneously. Favor SMX as much more likely culprit as patient had tolerated other cephalosporins in the past, but cannot say  with complete certainty that this was not related to cephalexin." As noted in [facility]     Patient with severe drug reaction including exfoliating skin rash and hypotension after being prescribed both cephalexin and sulfamethoxazole-trimethoprim simultaneously. Favor SMX as much more likely culprit as patient had tolerated other cephalosporins in the past, but cannot say with complete certainty that this was not related to cephalexin.  "Patient with severe drug reaction including exfoliating skin rash and hypotension after being prescribed both cephalexin and sulfamethoxazole-trimethoprim simultaneously. Favor SMX as much more likely  culprit as patient had tolerated other cephalosporins in the past, but cannot say with complete certainty that this was not related to cephalexin." As noted in [facility]  "Patient with severe drug reaction including exfoliating skin rash and hypotension after being prescribed both cephalexin and sulfamethoxazole-trimethoprim simultaneously. Favor SMX as much more likely culprit as patient had tolerated other cephalosporins in the past, but cannot say with complete certainty that this was not related to cephalexin." As noted in [facility]     Patient with severe drug reaction inc... (TRUNCATED)    OBJECTIVE: Blood pressure (!) 165/77, pulse 83, temperature 99.1 F (37.3 C), temperature source Oral, resp. rate 16, height 5\' 7"  (1.702 m), weight 72.6 kg, SpO2 98 %.  Physical Exam Constitutional:      General: He is not in acute distress.    Appearance: Normal appearance. He is well-developed. He is not ill-appearing or diaphoretic.  HENT:     Head: Normocephalic and atraumatic.     Right Ear: Hearing and external ear normal.     Left Ear: Hearing and external ear normal.     Nose: No nasal deformity or rhinorrhea.  Eyes:     General: No scleral icterus.    Conjunctiva/sclera: Conjunctivae normal.     Right eye: Right conjunctiva is not injected.     Left eye: Left conjunctiva is not injected.     Pupils: Pupils are equal, round, and reactive to light.  Neck:     Vascular: No JVD.  Cardiovascular:     Rate and Rhythm: Normal rate and regular rhythm.     Heart sounds: S1 normal and S2 normal.  Pulmonary:     Effort: Pulmonary effort is normal. No respiratory distress.     Breath sounds: No wheezing.  Abdominal:     General: Bowel sounds are normal. There is no distension.     Palpations: Abdomen is soft.     Tenderness: There is no abdominal tenderness.  Musculoskeletal:     Right shoulder: Normal.     Left shoulder: Normal.     Cervical back: Normal range of motion and neck  supple.     Right hip: Normal.     Left hip: Normal.     Right knee: Normal.     Left knee: Normal.  Lymphadenopathy:     Head:     Right side of head: No submandibular, preauricular or posterior auricular adenopathy.     Left side of head: No submandibular, preauricular or posterior auricular adenopathy.     Cervical: No cervical adenopathy.     Right cervical: No superficial or deep cervical adenopathy.    Left cervical: No superficial or deep cervical adenopathy.  Skin:    General: Skin is warm and dry.     Coloration: Skin is not pale.     Findings: No abrasion, bruising, ecchymosis, erythema, lesion or rash.     Nails: There is no clubbing.  Neurological:     General: No focal deficit present.     Mental Status: He is alert and oriented to person, place, and time.  Psychiatric:        Attention and Perception: He is attentive.        Mood and Affect: Mood normal.        Speech: Speech normal.        Behavior: Behavior normal. Behavior is cooperative.        Thought Content: Thought content normal.        Judgment: Judgment normal.    Right sided AVG bandaged  Lab Results Lab Results  Component Value Date   WBC 7.9 10/21/2022   HGB 7.9 (L) 10/21/2022   HCT 25.6 (L) 10/21/2022   MCV 88.3 10/21/2022   PLT 435 (H) 10/21/2022    Lab Results  Component Value Date   CREATININE 10.26 (H) 10/21/2022   BUN 65 (H) 10/21/2022   NA 138 10/21/2022   K 4.7 10/21/2022   CL 102 10/21/2022   CO2 22 10/21/2022    Lab Results  Component Value Date   ALT 27 10/21/2022   AST 22 10/21/2022   ALKPHOS 223 (H) 10/21/2022   BILITOT 0.5 10/21/2022     Microbiology: Recent Results (from the past 240 hour(s))  Blood culture (routine x 2)     Status: None (Preliminary result)   Collection Time: 10/20/22  6:43 PM   Specimen: BLOOD LEFT ARM  Result Value Ref Range Status   Specimen Description BLOOD LEFT ARM  Final   Special Requests   Final    BOTTLES DRAWN AEROBIC AND ANAEROBIC  Blood Culture results may not be optimal due to an inadequate volume of blood received in culture bottles   Culture   Final    NO GROWTH < 24 HOURS Performed at [facility], [address] [place], [place] [address]    Report Status PENDING  Incomplete  Blood culture (routine x 2)     Status: None (Preliminary result)   Collection Time: 10/20/22  9:47 PM   Specimen: BLOOD LEFT HAND  Result Value Ref Range Status   Specimen Description BLOOD LEFT HAND  Final   Special Requests   Final    BOTTLES DRAWN AEROBIC AND ANAEROBIC Blood Culture adequate volume   Culture   Final    NO GROWTH < 12 HOURS Performed at [facility], [address], [place], [place] [address]    Report Status PENDING  Incomplete    Acey Lav, MD [facility] for Infectious Disease [facility] [phone] pager  10/21/2022, 1:08 PM

## 2022-10-21 NOTE — Plan of Care (Signed)

## 2022-10-21 NOTE — Progress Notes (Signed)
Contacted by Renal NP this morning regarding pt. Contacted [facility] and spoke to Consulting civil engineer. Pt has not received out-pt HD at clinic since 4/19. Charge RN states that pt has not been d/c from clinic and should be able to resume at d/c. Pt's schedule is TTS 6:20 am arrival for 6:40 am chair time. Per renal NP, pt inquiring about snf at d/c. Contacted CSW and RN CM with this information. Will assist as needed.   Olivia Canter Renal Navigator [phone]

## 2022-10-21 NOTE — Progress Notes (Addendum)
[facility] Progress Note   Subjective: Seen in room. Asking about SNF placement. No C/Os at present.   Objective Vitals:   10/20/22 1716 10/20/22 2052 10/21/22 0000 10/21/22 0822  BP:  (!) 183/94 (!) 172/89 (!) 176/85  Pulse:  90 86 87  Resp:  12 15 15   Temp: 98 F (36.7 C) 97.9 F (36.6 C)  98.3 F (36.8 C)  TempSrc: Oral Oral    SpO2:  100% 98%   Weight:      Height:       Physical Exam General: Chronically ill appearing male in NAD Heart: S1,S2 RRR No M/R/G Lungs: CTAB Abdomen:NT, NABS Extremities: Woody appearance BLE trace edema BLE Dialysis Access: R AVG ulcerated area noted. +T/B   Additional Objective Labs: Basic Metabolic Panel: Recent Labs  Lab 10/20/22 1708 10/21/22 0549  NA 136 138  K 5.0 4.7  CL 97* 102  CO2 21* 22  GLUCOSE 151* 210*  BUN 60* 65*  CREATININE 9.49* 10.26*  CALCIUM 8.1* 7.7*  PHOS  --  6.2*   Liver Function Tests: Recent Labs  Lab 10/21/22 0549  AST 22  ALT 27  ALKPHOS 223*  BILITOT 0.5  PROT 7.0  ALBUMIN 2.7*   No results for input(s): "LIPASE", "AMYLASE" in the last 168 hours. CBC: Recent Labs  Lab 10/20/22 1708 10/21/22 0549  WBC 8.5 7.9  NEUTROABS 6.1 5.6  HGB 8.4* 7.9*  HCT 28.7* 25.6*  MCV 90.8 88.3  PLT 454* 435*   Blood Culture    Component Value Date/Time   SDES BLOOD LEFT HAND 10/20/2022 2147   SPECREQUEST  10/20/2022 2147    BOTTLES DRAWN AEROBIC AND ANAEROBIC Blood Culture adequate volume   CULT  10/20/2022 2147    NO GROWTH < 12 HOURS Performed at [facility], [address] [place], [place] [address]    REPTSTATUS PENDING 10/20/2022 2147    Cardiac Enzymes: No results for input(s): "CKTOTAL", "CKMB", "CKMBINDEX", "TROPONINI" in the last 168 hours. CBG: Recent Labs  Lab 10/20/22 1339 10/20/22 2132 10/21/22 0820  GLUCAP 170* 118* 143*   Iron Studies:  Recent Labs    10/21/22 0549  IRON 37*  TIBC 297  FERRITIN 371*   @lablastinr3 @ Studies/Results: DG  Knee Complete 4 Views Right  Result Date: 10/20/2022 CLINICAL DATA:  Ground level fall.  Right knee pain. EXAM: RIGHT KNEE - COMPLETE 4+ VIEW COMPARISON:  07/11/2022. FINDINGS: No fracture.  No bone lesion. 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 Knee joint normally spaced and aligned. No degenerative/arthropathic changes. No joint effusion. Soft tissues are unremarkable. IMPRESSION: 1. No fracture or acute finding. Electronically Signed   By: Amie Portland M.D.   On: 10/20/2022 14:47   DG Elbow Complete Right  Result Date: 10/20/2022 CLINICAL DATA:  Ground level fall.  Elbow pain. EXAM: RIGHT ELBOW - COMPLETE 3+ VIEW COMPARISON:  03/06/2010. FINDINGS: No acute fracture. Prosthetic radial head appears well seated and aligned. Narrowing of the humeral olecranon joint space with mild associated subchondral sclerosis and marginal osteophytes. No convincing joint effusion. Forearm AV fistula. IMPRESSION: 1. No fracture or acute finding. 2. 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 Well seated and positioned radial head prosthesis. Electronically Signed   By: Amie Portland M.D.   On: 10/20/2022 14:45   DG Chest Portable 1 View  Result Date: 10/20/2022 CLINICAL DATA:  Pain.  Fall.  Dialysis patient. EXAM: PORTABLE CHEST 1 VIEW COMPARISON:  September 17, 2022 FINDINGS: Stable cardiomegaly. No pneumothorax. No overt edema, nodule, mass, or infiltrate. No acute abnormalities are noted. A nonacute fracture of the distal left clavicle is again identified. Degenerative changes identified in the shoulders. IMPRESSION: 1. No active disease. 2. Nonacute fracture of the distal left clavicle. Electronically Signed   By: Gerome Sam III M.D.   On: 10/20/2022 14:43   Medications:  [START ON 10/22/2022] vancomycin     vancomycin      [START ON 10/22/2022] calcitRIOL  0.25 mcg Oral Q T,Th,Sat-1800   calcium acetate  2,001 mg Oral TID WC   carvedilol  6.25 mg Oral  BID WC   Chlorhexidine Gluconate Cloth  6 each Topical Q0600   insulin aspart  0-6 Units Subcutaneous TID WC   insulin glargine-yfgn  7 Units Subcutaneous QHS   pantoprazole  40 mg Oral Daily     OP HD: [facility] TTS  4h  400/ 800   61.5kg  2/2.5 bath  RFA AVG   - Heparin 2000 units IV TIW - last HD in [place] recorded on 09/06/22, post wt 74.3kg (dry wt was 61.5 back then too) - rocaltrol 0.25 mcg po tiw - mircera 100 mcg IV q 2 wks, last 09/06/22     VS 161/83, HR 88, RR 12, temp 98   Na 136  K 5.0  CO2 21  BUN 60  creat 9.5  WBC 8    Hb 8.4       Assessment/ Plan: MRSA bacteremia - at OSH in [place] in May 2024, pt was rx'd w/ IV vanc but left after his course could be completed. Likely his last dose was about 4-5 days ago. Repeat blood cx's have already been sent here. Pharmacy has been consulted to resume IV vanc here. Per pmd.  SP declot R forearm AVG - possibly done no 5/03, done out of town also. Good bruit here today.  ESRD - on HD TTS. Plan short HD 10/21/2022 then usual HD Tuesday 10/22/2022. Still has chair at [facility] T,Th,S.  HTN/ volume - volume overload, which is chronic for him. He usually runs 6-10 kg over his dry wt. Max UF w/ HD.  Anemia esrd - Hb 8.4, get tsat and ferritin, Very low Tsat but cannot load with Fe. Start ESA.  MBD ckd - Ca in range. Phos and alb ordered for am tomorrow. Resume rocaltrol po tiw. Get pth.  Homelessness - social issues. Interested in SNF placemetn  [place] H. Torey Reinard NP-C 10/21/2022, 10:20 AM  [employer] [phone]

## 2022-10-21 NOTE — Progress Notes (Addendum)
PT Cancellation Note  Patient Details Name: AASHIR PORTWOOD MRN: [id number] DOB: 01-03-1971  Addendum: re-attempted this pm, patient now says he needs dialysis before he can walk. Will follow up tomorrow.    Cancelled Treatment:    Reason Eval/Treat Not Completed: Patient declined, no reason specified Patient mostly concerned with where his food is that he ordered to be delivered downstairs. Says he is too mad to get up now.   Rakeya Glab 10/21/2022, 11:01 AM

## 2022-10-21 NOTE — Progress Notes (Signed)
Received patient in bed to unit.  Alert and oriented.  Informed consent signed and in chart.   TX duration:4 h  Patient tolerated well.  Transported back to the room  Alert, without acute distress.  Hand-off given to patient's nurse.   Access used: AVG Access issues: none  Total UF removed: 3.8 L Medication(s) given: Vancomycin 750 mg IV. Zofran 4 mg IV  Post HD VS: 160/90  p 80 R 18 Post HD weight: 73 kg   [facility]

## 2022-10-22 ENCOUNTER — Inpatient Hospital Stay (HOSPITAL_COMMUNITY): Payer: Medicaid Other

## 2022-10-22 DIAGNOSIS — I5021 Acute systolic (congestive) heart failure: Secondary | ICD-10-CM

## 2022-10-22 DIAGNOSIS — N189 Chronic kidney disease, unspecified: Secondary | ICD-10-CM | POA: Diagnosis not present

## 2022-10-22 DIAGNOSIS — R7881 Bacteremia: Secondary | ICD-10-CM | POA: Diagnosis not present

## 2022-10-22 DIAGNOSIS — E8729 Other acidosis: Secondary | ICD-10-CM | POA: Diagnosis not present

## 2022-10-22 DIAGNOSIS — E119 Type 2 diabetes mellitus without complications: Secondary | ICD-10-CM | POA: Diagnosis not present

## 2022-10-22 DIAGNOSIS — E1122 Type 2 diabetes mellitus with diabetic chronic kidney disease: Secondary | ICD-10-CM

## 2022-10-22 DIAGNOSIS — N186 End stage renal disease: Secondary | ICD-10-CM | POA: Diagnosis not present

## 2022-10-22 LAB — ECHOCARDIOGRAM COMPLETE
Height: 67 in
S' Lateral: 3.7 cm
Weight: 2589.08 oz

## 2022-10-22 LAB — PARATHYROID HORMONE, INTACT (NO CA): PTH: 167 pg/mL — ABNORMAL HIGH (ref 15–65)

## 2022-10-22 LAB — CULTURE, BLOOD (ROUTINE X 2): Special Requests: ADEQUATE

## 2022-10-22 LAB — GLUCOSE, CAPILLARY
Glucose-Capillary: 136 mg/dL — ABNORMAL HIGH (ref 70–99)
Glucose-Capillary: 133 mg/dL — ABNORMAL HIGH (ref 70–99)
Glucose-Capillary: 168 mg/dL — ABNORMAL HIGH (ref 70–99)
Glucose-Capillary: 140 mg/dL — ABNORMAL HIGH (ref 70–99)

## 2022-10-22 LAB — HEPATITIS B SURFACE ANTIBODY, QUANTITATIVE: Hep B S AB Quant (Post): <3.5 m[IU]/mL — ABNORMAL LOW (ref >9.9)

## 2022-10-22 MED ORDER — ISOSORBIDE MONONITRATE ER 60 MG PO TB24
60.0000 mg | ORAL_TABLET | Freq: Every day | ORAL | Status: DC
  Administered 2022-10-22: 60 mg via ORAL
  Filled 2022-10-22 (×4): qty 1

## 2022-10-22 MED ORDER — HYDRALAZINE HCL 20 MG/ML IJ SOLN: 5.0000 mg | INTRAMUSCULAR | Status: DC | PRN

## 2022-10-22 MED ORDER — HYDRALAZINE HCL 50 MG PO TABS
100.0000 mg | ORAL_TABLET | Freq: Three times a day (TID) | ORAL | Status: DC
  Administered 2022-10-22 – 2022-10-26 (×11): 100 mg via ORAL
  Filled 2022-10-22 (×13): qty 2

## 2022-10-22 MED ORDER — ISOSORB DINITRATE-HYDRALAZINE 20-37.5 MG PO TABS: 1.0000 | ORAL_TABLET | Freq: Three times a day (TID) | ORAL | Status: DC

## 2022-10-22 MED ORDER — ISOSORB DINITRATE-HYDRALAZINE 20-37.5 MG PO TABS
2.0000 | ORAL_TABLET | Freq: Three times a day (TID) | ORAL | Status: DC
  Administered 2022-10-22: 2 via ORAL
  Filled 2022-10-22: qty 2

## 2022-10-22 NOTE — TOC Initial Note (Signed)
Transition of Care [facility]) - Initial/Assessment Note    Patient Details  Name: [name] MRN: [id number] Date of Birth: Sep 30, 1970  Transition of Care [facility]) CM/SW Contact:    Mearl Latin, LCSW Phone Number: 10/22/2022, 2:18 PM  Clinical Narrative:                 Patient (familiar to CSW from prior hospitalization) [place]. Patient currently homeless and on outpatient dialysis requesting SNF. Placement barriers include the above factors, age, and insurance. Will continue to follow.     Update: CSW inquiring with [facility].   Expected Discharge Plan: Homeless Shelter Barriers to Discharge: Continued Medical Work up, English as a second language teacher, SNF Pending bed offer, Homeless with medical needs   Patient Goals and CMS Choice            Expected Discharge Plan and Services In-house Referral: Clinical Social Work     Living arrangements for the past 2 months: Homeless                                      Prior Living Arrangements/Services Living arrangements for the past 2 months: Homeless Lives with:: Self Patient language and need for interpreter reviewed:: Yes        Need for Family Participation in Patient Care: No (Comment) Care giver support system in place?: No (comment)   Criminal Activity/Legal Involvement Pertinent to Current Situation/Hospitalization: No - Comment as needed  Activities of Daily Living Home Assistive Devices/Equipment: None ADL Screening (condition at time of admission) Patient's cognitive ability adequate to safely complete daily activities?: Yes Is the patient deaf or have difficulty hearing?: No Does the patient have difficulty seeing, even when wearing glasses/contacts?: No Does the patient have difficulty concentrating, remembering, or making decisions?: No Patient able to express need for assistance with ADLs?: Yes Does the patient have difficulty dressing  or bathing?: No Independently performs ADLs?: Yes (appropriate for developmental age) Does the patient have difficulty walking or climbing stairs?: No Weakness of Legs: Both Weakness of Arms/Hands: None  Permission Sought/Granted                  Emotional Assessment       Orientation: : Oriented to Self, Oriented to Place, Oriented to  Time, Oriented to Situation Alcohol / Substance Use: Not Applicable Psych Involvement: No (comment)  Admission diagnosis:  ESRD on dialysis (HCC) [N18.6, Z99.2] MRSA bacteremia [R78.81, B95.62] Fall, initial encounter [W19.XXXA] Patient Active Problem List   Diagnosis Date Noted   MRSA bacteremia 10/20/2022   High anion gap metabolic acidosis 10/20/2022   Fall 10/20/2022   GERD (gastroesophageal reflux disease) 10/20/2022   Hemodialysis AV fistula thrombosis, initial encounter (HCC) 09/11/2022   AV fistula occlusion (HCC) 09/11/2022   CAP (community acquired pneumonia) 05/20/2022   Chronic combined systolic and diastolic congestive heart failure (HCC) 05/20/2022   Anxiety disorder, unspecified 03/26/2022   Hypocalcemia 03/25/2022   Iron deficiency anemia, unspecified 03/20/2022   CHF (congestive heart failure) (HCC) 11/15/2021   Nausea vomiting and diarrhea 11/07/2021   Homelessness 10/13/2021   Acute hypoxic respiratory failure (HCC) 10/13/2021   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) 09/28/2021   Aspiration pneumonia (HCC) 09/28/2021   Volume overload 04/12/2021   Mild protein-calorie malnutrition (HCC) 03/06/2021   Aftercare including intermittent dialysis (HCC) 02/27/2021   Allergy,  unspecified, initial encounter 02/27/2021   History of anemia due to chronic kidney disease 02/27/2021   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   Secondary hyperparathyroidism of renal origin (HCC) 02/27/2021   Shortness of breath 02/27/2021   Type 2 diabetes mellitus with other diabetic kidney complication (HCC)  02/27/2021   Hypertensive urgency 02/18/2021   Type 2 diabetes mellitus (HCC) 02/17/2021   Acute on chronic combined systolic and diastolic CHF (congestive heart failure) (HCC) 11/29/2020   Hypertensive crisis 11/27/2020   Stage 4 chronic kidney disease (HCC) 02/22/2020   Left ventricular dysfunction 04/27/2019   Hemoglobin A1C greater than 9%, indicating poor diabetic control 07/29/2018   LBBB (left bundle branch block)    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   Hyperlipidemia LDL goal <70 11/24/2008   DM2 (diabetes mellitus, type 2) (HCC) 07/17/2006   T2DM (type 2 diabetes mellitus) (HCC) 07/17/2006   End-stage renal disease on hemodialysis (HCC) 07/17/2006   Essential hypertension 07/17/2006   PCP:  [name], Clinic Pharmacy:   [facility] [address] [place] [address] Phone: [phone] Fax: [phone]  [facility] - [place], [place] - [address] [facility] [address] Floor [address] [place] [address] Phone: [phone] Fax: [phone]     Social Determinants of Health (SDOH) Social History: SDOH Screenings   Food Insecurity: Food Insecurity Present (05/08/2022)  Housing: High Risk (10/21/2022)  Transportation Needs: Unmet Transportation Needs (10/21/2022)  Utilities: Not At Risk (10/21/2022)  Depression (PHQ2-9): Low Risk  (10/13/2019)  Financial Resource Strain: High Risk (05/08/2022)  Tobacco Use: Low Risk  (10/20/2022)   SDOH Interventions:     Readmission Risk Interventions    10/22/2022    2:17 PM 11/07/2021    4:21 PM 02/19/2021    3:52 PM  Readmission Risk Prevention Plan  Transportation Screening Complete Complete Complete  Medication Review Oceanographer) Complete Complete Complete  PCP or Specialist appointment within 3-5 days of discharge Complete Complete Complete  HRI or Home Care Consult Complete Complete Complete  SW Recovery  Care/Counseling Consult Complete Complete Complete  Palliative Care Screening Not Applicable Not Applicable Not Applicable  Skilled Nursing Facility Complete Not Applicable Not Applicable

## 2022-10-22 NOTE — Progress Notes (Signed)
[facility] Progress Note   Subjective: Seen in room. HD later today on T,Th,S schedule. Net UF 3.8 liters 10/21/2022.   Objective Vitals:   10/21/22 2100 10/22/22 0043 10/22/22 0454 10/22/22 0809  BP: (!) 165/81 (!) 153/63  (!) 197/86  Pulse: 91 84  83  Resp: 13 11  20   Temp: 99.9 F (37.7 C) 99.5 F (37.5 C)  98.9 F (37.2 C)  TempSrc: Oral Oral  Oral  SpO2:      Weight:   73.4 kg   Height:       Physical Exam General: Chronically ill appearing male in NAD Heart: S1,S2 RRR No M/R/G Lungs: CTAB Abdomen:NT, NABS Extremities: Woody appearance BLE trace edema BLE Dialysis Access: R AVG ulcerated area noted. +T/B    Additional Objective Labs: Basic Metabolic Panel: Recent Labs  Lab 10/20/22 1708 10/21/22 0549  NA 136 138  K 5.0 4.7  CL 97* 102  CO2 21* 22  GLUCOSE 151* 210*  BUN 60* 65*  CREATININE 9.49* 10.26*  CALCIUM 8.1* 7.7*  PHOS  --  6.2*   Liver Function Tests: Recent Labs  Lab 10/21/22 0549  AST 22  ALT 27  ALKPHOS 223*  BILITOT 0.5  PROT 7.0  ALBUMIN 2.7*   No results for input(s): "LIPASE", "AMYLASE" in the last 168 hours. CBC: Recent Labs  Lab 10/20/22 1708 10/21/22 0549  WBC 8.5 7.9  NEUTROABS 6.1 5.6  HGB 8.4* 7.9*  HCT 28.7* 25.6*  MCV 90.8 88.3  PLT 454* 435*   Blood Culture    Component Value Date/Time   SDES BLOOD LEFT HAND 10/20/2022 2147   SPECREQUEST  10/20/2022 2147    BOTTLES DRAWN AEROBIC AND ANAEROBIC Blood Culture adequate volume   CULT  10/20/2022 2147    NO GROWTH < 12 HOURS Performed at [facility], [address] [place], [place] [address]    REPTSTATUS PENDING 10/20/2022 2147    Cardiac Enzymes: No results for input(s): "CKTOTAL", "CKMB", "CKMBINDEX", "TROPONINI" in the last 168 hours. CBG: Recent Labs  Lab 10/21/22 1214 10/21/22 1827 10/21/22 1906 10/21/22 2105 10/22/22 0803  GLUCAP 162* 51* 73 106* 136*   Iron Studies:  Recent Labs    10/21/22 0549  IRON 37*  TIBC  297  FERRITIN 371*   @lablastinr3 @ Studies/Results: ECHOCARDIOGRAM COMPLETE  Result Date: 10/22/2022    ECHOCARDIOGRAM REPORT   Patient Name:   [name] Date of Exam: 10/22/2022 Medical Rec #:  [id number]       Height:       67.0 in Accession #:    [id number]      Weight:       161.8 lb Date of Birth:  05/06/1971      BSA:          1.848 m Patient Age:    51 years        BP:           197/86 mmHg Patient Gender: M               HR:           86 bpm. Exam Location:  Inpatient Procedure: 2D Echo, Cardiac Doppler and Color Doppler Indications:    Bacteremia  History:        Patient has prior history of Echocardiogram examinations, most                 recent 05/24/2022. CHF, Arrythmias:LBBB, Signs/Symptoms:Shortness  of Breath; Risk Factors:Hypertension, Diabetes and Dyslipidemia.                 ESRD.  Sonographer:    Lucendia Herrlich Referring Phys: [id number] CORNELIUS N VAN DAM  Sonographer Comments: Image acquisition challenging due to uncooperative patient. IMPRESSIONS  1. There is severe concentric LVH and echogenic myocardium. Consider evalaution for infiltrative CM such as cardiac amyloid. . Left ventricular ejection fraction, by estimation, is 20 to 25%. The left ventricle has severely decreased function. The left ventricle demonstrates global hypokinesis. There is severe concentric left ventricular hypertrophy. Left ventricular diastolic function could not be evaluated.  2. Right ventricular systolic function is severely reduced. The right ventricular size is normal. There is severely elevated pulmonary artery systolic pressure. The estimated right ventricular systolic pressure is 66.6 mmHg.  3. Left atrial size was mildly dilated.  4. Right atrial size was mild to moderately dilated.  5. A small pericardial effusion is present. The pericardial effusion is posterior to the left ventricle. There is no evidence of cardiac tamponade.  6. The mitral valve is normal in structure. No evidence of  mitral valve regurgitation. No evidence of mitral stenosis.  7. Tricuspid valve regurgitation is moderate.  8. The aortic valve is normal in structure. Aortic valve regurgitation is trivial. No aortic stenosis is present.  9. Patient uncooperative. Only parasternal views available. No apical or subcostal windows. FINDINGS  Left Ventricle: There is severe concentric LVH and echogenic myocardium. Consider evalaution for infiltrative CM such as cardiac amyloid. Left ventricular ejection fraction, by estimation, is 20 to 25%. The left ventricle has severely decreased function. The left ventricle demonstrates global hypokinesis. The left ventricular internal cavity size was normal in size. There is severe concentric left ventricular hypertrophy. Left ventricular diastolic function could not be evaluated. Right Ventricle: The right ventricular size is normal. No increase in right ventricular wall thickness. Right ventricular systolic function is severely reduced. There is severely elevated pulmonary artery systolic pressure. The tricuspid regurgitant velocity is 3.59 m/s, and with an assumed right atrial pressure of 15 mmHg, the estimated right ventricular systolic pressure is 66.6 mmHg. Left Atrium: Left atrial size was mildly dilated. Right Atrium: Right atrial size was mild to moderately dilated. Pericardium: A small pericardial effusion is present. The pericardial effusion is posterior to the left ventricle. There is no evidence of cardiac tamponade. Mitral Valve: The mitral valve is normal in structure. No evidence of mitral valve regurgitation. No evidence of mitral valve stenosis. Tricuspid Valve: The tricuspid valve is normal in structure. Tricuspid valve regurgitation is moderate . No evidence of tricuspid stenosis. Aortic Valve: The aortic valve is normal in structure. Aortic valve regurgitation is trivial. No aortic stenosis is present. Pulmonic Valve: The pulmonic valve was normal in structure. Pulmonic valve  regurgitation is trivial. No evidence of pulmonic stenosis. Aorta: The aortic root is normal in size and structure. Venous: The inferior vena cava was not well visualized. IAS/Shunts: No atrial level shunt detected by color flow Doppler.  LEFT VENTRICLE PLAX 2D LVIDd:         4.10 cm LVIDs:         3.70 cm LV PW:         1.70 cm LV IVS:        1.40 cm LVOT diam:     1.90 cm LVOT Area:     2.84 cm  LEFT ATRIUM         Index LA diam:    3.90  cm 2.11 cm/m   AORTA Ao Asc diam: 3.10 cm TRICUSPID VALVE TR Peak grad:   51.6 mmHg TR Vmax:        359.00 cm/s  SHUNTS Systemic Diam: 1.90 cm Arvilla Meres MD Electronically signed by Arvilla Meres MD Signature Date/Time: 10/22/2022/10:25:20 AM    Final    DG Knee Complete 4 Views Right  Result Date: 10/20/2022 CLINICAL DATA:  Ground level fall.  Right knee pain. EXAM: RIGHT KNEE - COMPLETE 4+ VIEW COMPARISON:  07/11/2022. FINDINGS: No fracture.  No bone lesion. 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 Knee joint normally spaced and aligned. No degenerative/arthropathic changes. No joint effusion. Soft tissues are unremarkable. IMPRESSION: 1. No fracture or acute finding. Electronically Signed   By: Amie Portland M.D.   On: 10/20/2022 14:47   DG Elbow Complete Right  Result Date: 10/20/2022 CLINICAL DATA:  Ground level fall.  Elbow pain. EXAM: RIGHT ELBOW - COMPLETE 3+ VIEW COMPARISON:  03/06/2010. FINDINGS: No acute fracture. Prosthetic radial head appears well seated and aligned. Narrowing of the humeral olecranon joint space with mild associated subchondral sclerosis and marginal osteophytes. No convincing joint effusion. Forearm AV fistula. IMPRESSION: 1. No fracture or acute finding. 2. 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 Well seated and positioned radial head prosthesis. Electronically Signed   By: Amie Portland M.D.   On: 10/20/2022 14:45   DG Chest Portable 1 View  Result Date:  10/20/2022 CLINICAL DATA:  Pain.  Fall.  Dialysis patient. EXAM: PORTABLE CHEST 1 VIEW COMPARISON:  September 17, 2022 FINDINGS: Stable cardiomegaly. No pneumothorax. No overt edema, nodule, mass, or infiltrate. No acute abnormalities are noted. A nonacute fracture of the distal left clavicle is again identified. Degenerative changes identified in the shoulders. IMPRESSION: 1. No active disease. 2. Nonacute fracture of the distal left clavicle. Electronically Signed   By: Gerome Sam III M.D.   On: 10/20/2022 14:43   Medications:  vancomycin      calcitRIOL  0.25 mcg Oral Q T,Th,Sat-1800   calcium acetate  2,001 mg Oral TID WC   carvedilol  6.25 mg Oral BID WC   Chlorhexidine Gluconate Cloth  6 each Topical Q0600   heparin injection (subcutaneous)  5,000 Units Subcutaneous Q8H   insulin aspart  0-6 Units Subcutaneous TID WC   insulin glargine-yfgn  7 Units Subcutaneous QHS   isosorbide-hydrALAZINE  1 tablet Oral TID   pantoprazole  40 mg Oral Daily     OP HD: [facility] TTS  4h  400/ 800   61.5kg  2/2.5 bath  RFA AVG   - Heparin 2000 units IV TIW - last HD in [place] recorded on 09/06/22, post wt 74.3kg (dry wt was 61.5 back then too) - rocaltrol 0.25 mcg po tiw - mircera 100 mcg IV q 2 wks, last 09/06/22       Assessment/ Plan: MRSA bacteremia - at OSH in [place] in May 2024, pt was rx'd w/ IV vanc but left after his course could be completed. Likely his last dose was about 4-5 days ago. Repeat blood cx's have already been sent here. Pharmacy has been consulted to resume IV vanc here. Per pmd.  SP declot R forearm AVG - possibly done no 5/03, done out of town also. Good bruit here today.  ESRD - on HD TTS. Plan short HD 10/21/2022 then usual HD Tuesday 10/22/2022. Still has chair at [facility] T,Th,S.  HTN/ volume - volume overload, which  is chronic for him. He usually runs 6-10 kg over his dry wt. Max UF w/ HD 10/21/2022. Net UF 3.8 liters. Still very much over OP EDW. Continue to lower volume.  Anemia  esrd - HGB 7.9, get tsat and ferritin, Very low Tsat but cannot load with Fe. Rec'd Aranesp 150 MCG 10/21/2022.  MBD ckd - Ca in range. Phos and alb ordered for am tomorrow. Resume rocaltrol po tiw. Get pth.  Homelessness - social issues. Interested in SNF placemetn  [place] H. Nilson Tabora NP-C 10/22/2022, 12:17 PM  [employer] [phone]

## 2022-10-22 NOTE — Consult Note (Signed)
Cardiology Consultation   Patient ID: [name] MRN: [id number]; DOB: [birth year]  Admit date: 10/20/2022 Date of Consult: 10/22/2022  PCP:  [name]   [facility] Providers Cardiologist:  Nanetta Batty, MD      Patient Profile:   [name] is a 52 y.o. male with a hx of ESRD on HD, HTN, DM2, anemia of chronic disease with baseline hemoglobin 8-12, and recent hospitalization for MRSA bacteremia, and noncompliance who is being seen 10/22/2022 for the evaluation of CHF at the request of Dr. Randol Kern.  History of Present Illness:   [name] has frequent ER visits for HD. He was discharged from his HD center last year.  He often has ER visits and leaves without being seen.  He has also had admissions in [place] and [place].  He is not compliant with HD sessions.  He was last seen by Dr. Allyson Sabal in clinic 05/08/2022 followed for chronic LBBB, hypertension, hyperlipidemia.  He was homeless at the time coronary CTA in 2020 showed coronary calcium score of 0 with no evidence of CAD.  Echocardiogram in 2020 showed an LVEF 40-45% which is reduced from his prior echo in 2017.  Subsequent echocardiogram on 11/27/2020 showed an LVEF 30-35% grade 1 DD, moderately dilated left atria and small pericardial effusion.  Echo Jan 2024 with LVEF 25-30% with grade 2 DD, moderately elevated PASP, moderately dilated right atrium, and small pericardial effusion.   He was recently hospitalized in May 2024 in [place] for MRSA bacteremia.  Hospital course complicated by septic emboli associated with his AV fistula.  He was treated with IV vancomycin but subsequently left prior to completion of IV antibiotics and AMA in order to attend a court date in the [place] area.  He arrived in [place] on 10/20/2022 and tripped down the stairs of the bus sustaining injury on his right knee and right elbow.  He was admitted 10/20/2022 with anion gap metabolic acidosis suspected due to missed HD  sessions and recent MRSA bacteremia.  Lactic acid was 1.4.  He did not feel his presentation was consistent with DKA or septic shock.  Echocardiogram 10/22/2022 with an LVEF 20-25% with severe LVH concerning for infiltrative cardiomyopathy such as cardiac amyloid.  RV function is also severely reduced with elevated PASP of 66.6 millimeters mercury and biatrial enlargement.  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  He has been started on carvedilol and BiDil, but remains hypertensive. On exam, he does not know if he is having chest pain or shortness of breath. He is homeless and has transportation via medicaid but is routinely not at the shelter to be picked up causing him to miss HD. He reports compliance on home medications but can't tell me what he is taking.    Past Medical History:  Diagnosis Date   Anxiety 02/2019   Anxiety 10/2019   Diabetes mellitus type II, uncontrolled 07/17/2006   Elevated alkaline phosphatase level 01/2019   ESSENTIAL HYPERTENSION 07/17/2006   Homelessness 10/13/2021   HYPERLIPIDEMIA 11/24/2008   Insomnia 02/2019   Left bundle branch block 02/22/2019   Lesion of lip 10/2019   Pneumonia 10/22/2011   Suicidal ideations 02/2019   Vitamin D deficiency 01/2019    Past Surgical History:  Procedure Laterality Date   ARM SURGERY     AV FISTULA PLACEMENT Right 02/20/2021   Procedure: RIGHT ARTERIOVENOUS GRAFT CREATION;  Surgeon: Chuck Hint, MD;  Location: [facility] OR;  Service:  Vascular;  Laterality: Right;   INSERTION OF DIALYSIS CATHETER Left 02/20/2021   Procedure: INSERTION OF DIALYSIS CATHETER USING PALINDROME CATHETER;  Surgeon: Chuck Hint, MD;  Location: [facility] OR;  Service: Vascular;  Laterality: Left;   IR AV DIALY SHUNT INTRO NEEDLE/INTRACATH INITIAL W/PTA/IMG RIGHT Right 11/06/2021   IR FLUORO GUIDE CV LINE RIGHT  09/12/2022   IR REMOVAL TUN CV CATH W/O FL  07/27/2021   IR THROMBECTOMY AV FISTULA  W/THROMBOLYSIS INC/SHUNT/IMG RIGHT  09/11/2022   IR US GUIDE VASC ACCESS RIGHT  11/06/2021   IR US GUIDE VASC ACCESS RIGHT  09/11/2022   IR US GUIDE VASC ACCESS RIGHT  09/12/2022   ULTRASOUND GUIDANCE FOR VASCULAR ACCESS  02/20/2021   Procedure: ULTRASOUND GUIDANCE FOR VASCULAR ACCESS;  Surgeon: Chuck Hint, MD;  Location: [facility] OR;  Service: Vascular;;     Home Medications:  Prior to Admission medications   Medication Sig Start Date End Date Taking? Authorizing Provider  acetaminophen (TYLENOL) 500 MG tablet Take 1 tablet (500 mg total) by mouth every 8 (eight) hours as needed for mild pain. 05/28/22   Leroy Sea, MD  amLODipine (NORVASC) 10 MG tablet Take 1 tablet (10 mg total) by mouth daily. 05/26/22   Leroy Sea, MD  ASPIRIN LOW DOSE 81 MG tablet Take 1 tablet (81 mg total) by mouth daily. 05/28/22   Leroy Sea, MD  benzonatate (TESSALON) 100 MG capsule Take 1 capsule (100 mg total) by mouth every 8 (eight) hours. 09/09/22   Sabas Sous, MD  calcium acetate (PHOSLO) 667 MG capsule Take 3 capsules (2,001 mg total) by mouth 3 (three) times daily. 05/28/22   Leroy Sea, MD  carvedilol (COREG) 6.25 MG tablet Take 1 tablet (6.25 mg total) by mouth 2 (two) times daily with a meal. 05/28/22   Leroy Sea, MD  Fingerstix Lancets MISC Use as directed up to 4 times daily. 05/28/22   Leroy Sea, MD  glucose blood test strip Use to check fasting blood sugar once daily. 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 Insulin dependent 11/29/20   Hollice Espy, MD  glucose blood test strip Use as directed up to 4 times daily 05/28/22   Leroy Sea, MD  insulin aspart (NOVOLOG) 100 UNIT/ML FlexPen Before each meal 3 times a day, 140-199 - 2 units, 200-250 - 4 units, 251-299 - 6 units,  300-349 - 8 units,  350 or above 10 units. 05/28/22   Leroy Sea, MD  insulin glargine (LANTUS SOLOSTAR) 100 UNIT/ML Solostar Pen Inject 35 Units into the skin daily.Not to exceed 100 units per day.  06/19/22     Insulin Pen Needle 32G X 4 MM MISC Use with insulin pens 05/28/22   Leroy Sea, MD  Insulin Syringe-Needle U-100 25G X 1" 1 ML MISC For 4 times a day insulin SQ, 1 month supply. Diagnosis E11.65 05/28/22   Leroy Sea, MD  isosorbide-hydrALAZINE (BIDIL) 20-37.5 MG tablet Take 1 tablet by mouth 3 (three) times daily. 07/20/22     oxyCODONE-acetaminophen (PERCOCET/ROXICET) 5-325 MG tablet Take 1 tablet by mouth every 12 (twelve) hours as needed for severe pain. 07/04/22   Derwood Kaplan, MD  PROTONIX 40 MG tablet Take 1 tablet (40 mg total) by mouth daily. 05/25/22   Leroy Sea, MD  sevelamer carbonate (RENVELA) 800 MG tablet Take 2 tablets (1,600 mg total) by mouth 3 (three) times daily with meals. May also take 1 tablet (800 mg total) 2 (  two) times daily as needed with snacks. 05/28/22   Leroy Sea, MD  gabapentin (NEURONTIN) 300 MG capsule Take 1 capsule (300 mg total) by mouth 3 (three) times daily. Patient not taking: Reported on 07/27/2019 07/29/18 08/30/19  Kallie Locks, FNP  sildenafil (VIAGRA) 25 MG tablet Take 1 tablet (25 mg total) by mouth as needed for erectile dysfunction. for erectile dysfunction. Patient not taking: Reported on 08/11/2020 10/13/19 11/27/20  Kallie Locks, FNP    Inpatient Medications: Scheduled Meds:  calcitRIOL  0.25 mcg Oral Q T,Th,Sat-1800   calcium acetate  2,001 mg Oral TID WC   carvedilol  6.25 mg Oral BID WC   Chlorhexidine Gluconate Cloth  6 each Topical Q0600   heparin injection (subcutaneous)  5,000 Units Subcutaneous Q8H   insulin aspart  0-6 Units Subcutaneous TID WC   insulin glargine-yfgn  7 Units Subcutaneous QHS   isosorbide-hydrALAZINE  2 tablet Oral TID   pantoprazole  40 mg Oral Daily   Continuous Infusions:  vancomycin     PRN Meds: acetaminophen **OR** acetaminophen, hydrALAZINE, melatonin, ondansetron (ZOFRAN) IV, oxyCODONE-acetaminophen  Allergies:    Allergies  Allergen Reactions   Sulfa  Antibiotics Rash    Severe rash.  Severe rash.     Severe rash.   Other Other (See Comments)    Patient states he is allergic to the TB test - unknown reaction during childhood    Tuberculin Ppd     Other Reaction(s): Other (See Comments)   Bactrim [Sulfamethoxazole-Trimethoprim] Rash   Cephalexin Other (See Comments) and Rash    Patient with severe drug reaction including exfoliating skin rash and hypotension after being prescribed both cephalexin and sulfamethoxazole-trimethoprim simultaneously. Favor SMX as much more likely culprit as patient had tolerated other cephalosporins in the past, but cannot say with complete certainty that this was not related to cephalexin.  Other Reaction(s): Other, Other (See Comments)  Patient with severe drug reaction including exfoliating skin rash and hypotension after being prescribed both cephalexin and sulfamethoxazole-trimethoprim simultaneously. Favor SMX as much more likely culprit as patient had tolerated other cephalosporins in the past, but cannot say with complete certainty that this was not related to cephalexin.   "Patient with severe drug reaction including exfoliating skin rash and hypotension after being prescribed both cephalexin and sulfamethoxazole-trimethoprim simultaneously. Favor SMX as much more likely culprit as patient had tolerated other cephalosporins in the past, but cannot say with complete certainty that this was not related to cephalexin." As noted in [facility]   "Patient with severe drug reaction including exfoliating skin rash and hypotension after being prescribed both cephalexin and sulfamethoxazole-trimethoprim simultaneously. Favor SMX as much more likely culprit as patient had tolerated other cephalosporins in the past, but cannot say with complete certainty that this was not related to cephalexin." As noted in [facility]   "Patient with severe drug reaction including exfoliating skin rash and hypotension after being  prescribed both cephalexin and sulfamethoxazole-trimethoprim simultaneously. Favor SMX as much more likely culprit as patient had tolerated other cephalosporins in the past, but cannot say with complete certainty that this was not related to cephalexin." As noted in [facility]     Patient with severe drug reaction including exfoliating skin rash and hypotension after being prescribed both cephalexin and sulfamethoxazole-trimethoprim simultaneously. Favor SMX as much more likely culprit as patient had tolerated other cephalosporins in the past, but cannot say with complete certainty that this was not related to cephalexin.  "Patient with severe drug reaction  including exfoliating skin rash and hypotension after being prescribed both cephalexin and sulfamethoxazole-trimethoprim simultaneously. Favor SMX as much more likely culprit as patient had tolerated other cephalosporins in the past, but cannot say with complete certainty that this was not related to cephalexin." As noted in [facility]  "Patient with severe drug reaction including exfoliating skin rash and hypotension after being prescribed both cephalexin and sulfamethoxazole-trimethoprim simultaneously. Favor SMX as much more likely culprit as patient had tolerated other cephalosporins in the past, but cannot say with complete certainty that this was not related to cephalexin." As noted in [facility]     Patient with severe drug reaction inc... (TRUNCATED)    Social History:   Social History   Socioeconomic History   Marital status: Legally Separated    Spouse name: Not on file   Number of children: Not on file   Years of education: Not on file   Highest education level: Not on file  Occupational History   Occupation: cook    Comment: lost job   Occupation: Company secretary    Comment: Updated June 2013   Occupation: Data processing manager    Comment: Began Jan 2014  Tobacco Use   Smoking status: Never   Smokeless tobacco: Never  Vaping Use    Vaping Use: Never used  Substance and Sexual Activity   Alcohol use: No    Alcohol/week: 0.0 standard drinks of alcohol   Drug use: No   Sexual activity: Yes    Partners: Female  Other Topics Concern   Not on file  Social History Narrative   Lives with his mother and brother.   Wife has had a foot amputation; is a type 1 DM.   Social Determinants of Health   Financial Resource Strain: High Risk (05/08/2022)   Overall Financial Resource Strain (CARDIA)    Difficulty of Paying Living Expenses: Very hard  Food Insecurity: Food Insecurity Present (05/08/2022)   Hunger Vital Sign    Worried About Running Out of Food in the Last Year: Often true    Ran Out of Food in the Last Year: Often true  Transportation Needs: Unmet Transportation Needs (10/21/2022)   PRAPARE - Administrator, Civil Service (Medical): Yes    Lack of Transportation (Non-Medical): Yes  Physical Activity: Not on file  Stress: Not on file  Social Connections: Not on file  Intimate Partner Violence: Not At Risk (10/21/2022)   Humiliation, Afraid, Rape, and Kick questionnaire    Fear of Current or Ex-Partner: No    Emotionally Abused: No    Physically Abused: No    Sexually Abused: No    Family History:    Family History  Problem Relation Age of Onset   Hypertension Mother 50   Cancer Mother        bladder cancer   Alcohol abuse Father    Hypertension Brother      ROS:  Please see the history of present illness.   All other ROS reviewed and negative.     Physical Exam/Data:   Vitals:   10/22/22 0043 10/22/22 0454 10/22/22 0809 10/22/22 1239  BP: (!) 153/63  (!) 197/86 (!) 190/76  Pulse: 84  83 79  Resp: 11  20 18   Temp: 99.5 F (37.5 C)  98.9 F (37.2 C) 99.1 F (37.3 C)  TempSrc: Oral  Oral Oral  SpO2:      Weight:  73.4 kg    Height:  Intake/Output Summary (Last 24 hours) at 10/22/2022 1317 Last data filed at 10/21/2022 1730 Gross per 24 hour  Intake --  Output 4100 ml   Net -4100 ml      10/22/2022    4:54 AM 10/21/2022    5:44 PM 10/21/2022    2:42 PM  Last 3 Weights  Weight (lbs) 161 lb 13.1 oz 160 lb 15 oz 169 lb 12.1 oz  Weight (kg) 73.4 kg 73 kg 77 kg     Body mass index is 25.34 kg/m.  General:  Well nourished, well developed, in no acute distress HEENT: normal Neck: no JVD Vascular: No carotid bruits; Distal pulses 2+ bilaterally Cardiac:  normal S1, S2; RRR; no murmur  Lungs:  clear to auscultation bilaterally, no wheezing, rhonchi or rales  Abd: soft, nontender, no hepatomegaly  Ext: no edema Musculoskeletal:  No deformities, BUE and BLE strength normal and equal Skin: warm and dry  Neuro:  CNs 2-12 intact, no focal abnormalities noted Psych:  Normal affect   EKG:  The EKG was personally reviewed and demonstrates:  NA Telemetry:  Telemetry was personally reviewed and demonstrates:  sinus rhythm with HR 70s  Relevant CV Studies:  Echo 10/2022 1. There is severe concentric LVH and echogenic myocardium. Consider  evalaution for infiltrative CM such as cardiac amyloid. . Left ventricular  ejection fraction, by estimation, is 20 to 25%. The left ventricle has  severely decreased function. The left  ventricle demonstrates global hypokinesis. There is severe concentric left  ventricular hypertrophy. Left ventricular diastolic function could not be  evaluated.   2. Right ventricular systolic function is severely reduced. The right  ventricular size is normal. There is severely elevated pulmonary artery  systolic pressure. The estimated right ventricular systolic pressure is  66.6 mmHg.   3. Left atrial size was mildly dilated.   4. Right atrial size was mild to moderately dilated.   5. A small pericardial effusion is present. The pericardial effusion is  posterior to the left ventricle. There is no evidence of cardiac  tamponade.   6. The mitral valve is normal in structure. No evidence of mitral valve  regurgitation. No evidence of  mitral stenosis.   7. Tricuspid valve regurgitation is moderate.   8. The aortic valve is normal in structure. Aortic valve regurgitation is  trivial. No aortic stenosis is present.   9. Patient uncooperative. Only parasternal views available. No apical or  subcostal windows.     Echo 05/2022: 1. Left ventricular ejection fraction, by estimation, is 25 to 30%. The  left ventricle has severely decreased function. The left ventricle  demonstrates global hypokinesis. There is severe concentric left  ventricular hypertrophy. Left ventricular  diastolic parameters are consistent with Grade II diastolic dysfunction  (pseudonormalization).   2. Right ventricular systolic function is normal. The right ventricular  size is normal. There is moderately elevated pulmonary artery systolic  pressure.   3. Right atrial size was moderately dilated.   4. A small pericardial effusion is present. The pericardial effusion is  circumferential.   5. No evidence of mitral valve regurgitation.   6. Tricuspid valve regurgitation is moderate.   7. The aortic valve is tricuspid. Aortic valve regurgitation is not  visualized. Aortic valve sclerosis is present, with no evidence of aortic  valve stenosis.   8. The inferior vena cava is dilated in size with >50% respiratory  variability, suggesting right atrial pressure of 8 mmHg.    Echo 11/2020: 1. Left ventricular  ejection fraction, by estimation, is 30 to 35%. The  left ventricle has moderately decreased function. The left ventricle  demonstrates global hypokinesis. There is severe concentric left  ventricular hypertrophy. Left ventricular  diastolic parameters are consistent with Grade I diastolic dysfunction  (impaired relaxation).   2. Right ventricular systolic function is normal. The right ventricular  size is normal. Mildly increased right ventricular wall thickness. There  is normal pulmonary artery systolic pressure.   3. Left atrial size was  moderately dilated.   4. A small pericardial effusion is present. The pericardial effusion is  circumferential.   5. The mitral valve is grossly normal. Mild mitral valve regurgitation.   6. The aortic valve is tricuspid. There is mild calcification of the  aortic valve. There is mild thickening of the aortic valve. Aortic valve  regurgitation is not visualized. Mild aortic valve sclerosis is present,  with no evidence of aortic valve  stenosis.   7. The inferior vena cava is normal in size with greater than 50%  respiratory variability, suggesting right atrial pressure of 3 mmHg.   8. Severe, concerntric LVH most likely secondary to severe HTN, however,  amyloidosis is also on the differential. Could consider CMR as out-patient  to assess further pending clinical suspicion.    CT coronary 2020 IMPRESSION: 1. Coronary calcium score of 0. This was 0 percentile for age and sex matched control.   2. Normal coronary origin with right dominance.   3. CAD-RADS 0. No evidence of CAD (0%). Consider non-atherosclerotic causes of chest pain.     Laboratory Data:  High Sensitivity Troponin:  No results for input(s): "TROPONINIHS" in the last 720 hours.   Chemistry Recent Labs  Lab 10/20/22 1708 10/21/22 0549  NA 136 138  K 5.0 4.7  CL 97* 102  CO2 21* 22  GLUCOSE 151* 210*  BUN 60* 65*  CREATININE 9.49* 10.26*  CALCIUM 8.1* 7.7*  MG 2.6* 2.5*  GFRNONAA 6* 6*  ANIONGAP 18* 14    Recent Labs  Lab 10/21/22 0549  PROT 7.0  ALBUMIN 2.7*  AST 22  ALT 27  ALKPHOS 223*  BILITOT 0.5   Lipids No results for input(s): "CHOL", "TRIG", "HDL", "LABVLDL", "LDLCALC", "CHOLHDL" in the last 168 hours.  Hematology Recent Labs  Lab 10/20/22 1708 10/21/22 0549  WBC 8.5 7.9  RBC 3.16* 2.90*  HGB 8.4* 7.9*  HCT 28.7* 25.6*  MCV 90.8 88.3  MCH 26.6 27.2  MCHC 29.3* 30.9  RDW 16.4* 16.3*  PLT 454* 435*   Thyroid No results for input(s): "TSH", "FREET4" in the last 168 hours.   BNPNo results for input(s): "BNP", "PROBNP" in the last 168 hours.  DDimer No results for input(s): "DDIMER" in the last 168 hours.   Radiology/Studies:  ECHOCARDIOGRAM COMPLETE  Result Date: 10/22/2022    ECHOCARDIOGRAM REPORT   Patient Name:   [name] Date of Exam: 10/22/2022 Medical Rec #:  [id number]       Height:       67.0 in Accession #:    [id number]      Weight:       161.8 lb Date of Birth:  [birth year]      BSA:          1.848 m Patient Age:    51 years        BP:           197/86 mmHg Patient Gender: M  HR:           86 bpm. Exam Location:  Inpatient Procedure: 2D Echo, Cardiac Doppler and Color Doppler Indications:    Bacteremia  History:        Patient has prior history of Echocardiogram examinations, most                 recent 05/24/2022. CHF, Arrythmias:LBBB, Signs/Symptoms:Shortness                 of Breath; Risk Factors:Hypertension, Diabetes and Dyslipidemia.                 ESRD.  Sonographer:    Lucendia Herrlich Referring Phys: [id number] CORNELIUS N VAN DAM  Sonographer Comments: Image acquisition challenging due to uncooperative patient. IMPRESSIONS  1. There is severe concentric LVH and echogenic myocardium. Consider evalaution for infiltrative CM such as cardiac amyloid. . Left ventricular ejection fraction, by estimation, is 20 to 25%. The left ventricle has severely decreased function. The left ventricle demonstrates global hypokinesis. There is severe concentric left ventricular hypertrophy. Left ventricular diastolic function could not be evaluated.  2. Right ventricular systolic function is severely reduced. The right ventricular size is normal. There is severely elevated pulmonary artery systolic pressure. The estimated right ventricular systolic pressure is 66.6 mmHg.  3. Left atrial size was mildly dilated.  4. Right atrial size was mild to moderately dilated.  5. A small pericardial effusion is present. The pericardial effusion is posterior to the left ventricle.  There is no evidence of cardiac tamponade.  6. The mitral valve is normal in structure. No evidence of mitral valve regurgitation. No evidence of mitral stenosis.  7. Tricuspid valve regurgitation is moderate.  8. The aortic valve is normal in structure. Aortic valve regurgitation is trivial. No aortic stenosis is present.  9. Patient uncooperative. Only parasternal views available. No apical or subcostal windows. FINDINGS  Left Ventricle: There is severe concentric LVH and echogenic myocardium. Consider evalaution for infiltrative CM such as cardiac amyloid. Left ventricular ejection fraction, by estimation, is 20 to 25%. The left ventricle has severely decreased function. The left ventricle demonstrates global hypokinesis. The left ventricular internal cavity size was normal in size. There is severe concentric left ventricular hypertrophy. Left ventricular diastolic function could not be evaluated. Right Ventricle: The right ventricular size is normal. No increase in right ventricular wall thickness. Right ventricular systolic function is severely reduced. There is severely elevated pulmonary artery systolic pressure. The tricuspid regurgitant velocity is 3.59 m/s, and with an assumed right atrial pressure of 15 mmHg, the estimated right ventricular systolic pressure is 66.6 mmHg. Left Atrium: Left atrial size was mildly dilated. Right Atrium: Right atrial size was mild to moderately dilated. Pericardium: A small pericardial effusion is present. The pericardial effusion is posterior to the left ventricle. There is no evidence of cardiac tamponade. Mitral Valve: The mitral valve is normal in structure. No evidence of mitral valve regurgitation. No evidence of mitral valve stenosis. Tricuspid Valve: The tricuspid valve is normal in structure. Tricuspid valve regurgitation is moderate . No evidence of tricuspid stenosis. Aortic Valve: The aortic valve is normal in structure. Aortic valve regurgitation is trivial. No  aortic stenosis is present. Pulmonic Valve: The pulmonic valve was normal in structure. Pulmonic valve regurgitation is trivial. No evidence of pulmonic stenosis. Aorta: The aortic root is normal in size and structure. Venous: The inferior vena cava was not well visualized. IAS/Shunts: No atrial level shunt detected by color flow Doppler.  LEFT VENTRICLE PLAX 2D LVIDd:         4.10 cm LVIDs:         3.70 cm LV PW:         1.70 cm LV IVS:        1.40 cm LVOT diam:     1.90 cm LVOT Area:     2.84 cm  LEFT ATRIUM         Index LA diam:    3.90 cm 2.11 cm/m   AORTA Ao Asc diam: 3.10 cm TRICUSPID VALVE TR Peak grad:   51.6 mmHg TR Vmax:        359.00 cm/s  SHUNTS Systemic Diam: 1.90 cm Arvilla Meres MD Electronically signed by Arvilla Meres MD Signature Date/Time: 10/22/2022/10:25:20 AM    Final    DG Knee Complete 4 Views Right  Result Date: 10/20/2022 CLINICAL DATA:  Ground level fall.  Right knee pain. EXAM: RIGHT KNEE - COMPLETE 4+ VIEW COMPARISON:  07/11/2022. FINDINGS: No fracture.  No bone lesion. 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 Knee joint normally spaced and aligned. No degenerative/arthropathic changes. No joint effusion. Soft tissues are unremarkable. IMPRESSION: 1. No fracture or acute finding. Electronically Signed   By: Amie Portland M.D.   On: 10/20/2022 14:47   DG Elbow Complete Right  Result Date: 10/20/2022 CLINICAL DATA:  Ground level fall.  Elbow pain. EXAM: RIGHT ELBOW - COMPLETE 3+ VIEW COMPARISON:  03/06/2010. FINDINGS: No acute fracture. Prosthetic radial head appears well seated and aligned. Narrowing of the humeral olecranon joint space with mild associated subchondral sclerosis and marginal osteophytes. No convincing joint effusion. Forearm AV fistula. IMPRESSION: 1. No fracture or acute finding. 2. 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 Well seated and positioned radial head prosthesis. Electronically Signed    By: Amie Portland M.D.   On: 10/20/2022 14:45   DG Chest Portable 1 View  Result Date: 10/20/2022 CLINICAL DATA:  Pain.  Fall.  Dialysis patient. EXAM: PORTABLE CHEST 1 VIEW COMPARISON:  September 17, 2022 FINDINGS: Stable cardiomegaly. No pneumothorax. No overt edema, nodule, mass, or infiltrate. No acute abnormalities are noted. A nonacute fracture of the distal left clavicle is again identified. Degenerative changes identified in the shoulders. IMPRESSION: 1. No active disease. 2. Nonacute fracture of the distal left clavicle. Electronically Signed   By: Gerome Sam III M.D.   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 Severe LVH - volume management per HD - GDMT: coreg and bidil - echo images suspicious for infiltrative process - consider cardiac MRI vs PYP scan as an outpatient - suspect a component of hypertensive cardiomyopathy   Hypertension - appears to need better BP control - may need to split bidil for higher dose of hydralazine - BP continues to be in the 190s - stop bidil, start 75 mg hydralazine TID and 30 mg imdur with plans to up-titrate    ESRD on HD Anemia of chronic disease Per primary / nephrology   MRSA bacteremia  - per ID  Noncompliance - was dismissed from HD center last year, now at [facility] TTS  Homelessness Interested in SNF placement Appreciate CM/SW   Risk Assessment/Risk Scores:     New York Heart Association (NYHA) Functional Class NYHA Class II        For questions or updates, please contact [facility] Please  consult [email or web address] for contact info under    Signed, Marcelino Duster, PA  10/22/2022 1:17 PM

## 2022-10-22 NOTE — Progress Notes (Signed)
Echocardiogram 2D Echocardiogram has been performed.  [name] 10/22/2022, 10:12 AM

## 2022-10-22 NOTE — Evaluation (Signed)
Physical Therapy Evaluation Patient Details Name: [name] MRN: [id number] DOB: 1971/05/15 Today's Date: 10/22/2022  History of Present Illness  52 y/o M admitted to [facility] on 6/2 after a fall, admitted with anion gap metabolic acidosis. Fall resulted in R knee and R elbow abrasions but x-ray showing no acute fracture. PMHx: ESRD on HDU, DM, anemia of chronic kidney disease, recent hospitalization in [place] for MRSA bacteremia and right AVG requiring declotting process but leaving AMA  Clinical Impression  Pt presents today with impaired functional mobility, limited by balance, strength, and activity tolerance. Pt reports being independent at baseline, however admits to ~10-11 falls over the past 6 months and feeling deconditioned from his baseline. Pt ambulated with no AD today but noted 2 LOB requiring min-modA to correct, pt utilizing a wider BOS during ambulation with mild antalgic gait on RLE. Pt reports no support for care at this time and needs to be independent or mod I with all mobility for safety with mobility, recommend subacute PT to maximize independence with mobility. Acute PT will continue to follow as appropriate.        Recommendations for follow up therapy are one component of a multi-disciplinary discharge planning process, led by the attending physician.  Recommendations may be updated based on patient status, additional functional criteria and insurance authorization.  Follow Up Recommendations Can patient physically be transported by private vehicle: Yes     Assistance Recommended at Discharge Frequent or constant Supervision/Assistance  Patient can return home with the following  A little help with walking and/or transfers;Help with stairs or ramp for entrance;Assist for transportation    Equipment Recommendations Other (comment) (defer to next level)  Recommendations for Other Services  OT consult    Functional Status Assessment Patient has had a recent decline  in their functional status and demonstrates the ability to make significant improvements in function in a reasonable and predictable amount of time.     Precautions / Restrictions Precautions Precautions: Fall;Other (comment) Precaution Comments: watch O2 Restrictions Weight Bearing Restrictions: No      Mobility  Bed Mobility Overal bed mobility: Needs Assistance Bed Mobility: Supine to Sit, Sit to Supine     Supine to sit: Supervision, HOB elevated Sit to supine: Supervision, HOB elevated   General bed mobility comments: increaed time and use of bed rail    Transfers Overall transfer level: Needs assistance Equipment used: None Transfers: Sit to/from Stand Sit to Stand: Min assist           General transfer comment: minA for steady after rise, pt utilizing bed rail for support with initial stand    Ambulation/Gait Ambulation/Gait assistance: Min assist, Mod assist Gait Distance (Feet): 150 Feet (with one standing rest break) Assistive device: None Gait Pattern/deviations: Step-through pattern, Decreased stride length, Antalgic, Wide base of support, Drifts right/left Gait velocity: decreased     General Gait Details: wide BOS with moderate imbalance and ~2 LOB requiring min-modA to correct. Antalgic gait on RLE but mildly improving with continued ambulation  Stairs            Wheelchair Mobility    Modified Rankin (Stroke Patients Only)       Balance Overall balance assessment: Needs assistance Sitting-balance support: No upper extremity supported, Feet supported Sitting balance-Leahy Scale: Fair     Standing balance support: No upper extremity supported, During functional activity Standing balance-Leahy Scale: Fair Standing balance comment: moderate imbalance during ambulation, intermittent assist required  Pertinent Vitals/Pain Pain Assessment Pain Assessment: Faces Faces Pain Scale: No hurt     Home Living Family/patient expects to be discharged to:: Shelter/Homeless                   Additional Comments: living in the streets for ~1 year, says he is supposed to be on 2L O2 but doesn't have any tanks, no DME, no friends or family identified at this time    Prior Function Prior Level of Function : Independent/Modified Independent;History of Falls (last six months)             Mobility Comments: reports ~10-11 falls in the past 6 months, often tripping or poor obstacle negotiation. Reports L eye vision difficulty for ~1 year       Hand Dominance   Dominant Hand: Left    Extremity/Trunk Assessment   Upper Extremity Assessment Upper Extremity Assessment: Generalized weakness    Lower Extremity Assessment Lower Extremity Assessment: Generalized weakness (reports occasional numbness/tingling in B feet but intact to light touch today)    Cervical / Trunk Assessment Cervical / Trunk Assessment: Kyphotic  Communication   Communication: No difficulties  Cognition Arousal/Alertness: Awake/alert Behavior During Therapy: Flat affect Overall Cognitive Status: No family/caregiver present to determine baseline cognitive functioning                                 General Comments: follows commands and answers questions appropriately, requires encouragement initially but then willing to participate        General Comments General comments (skin integrity, edema, etc.): bandage to R elbow and R knee, pt on 2L O2 [place], SPO2 stable throughout, however pt reports feeling dizzy while seated EOB and O2 around his neck, placed in his nose and reports feeling better, RN reporting O2 is prn    Exercises     Assessment/Plan    PT Assessment Patient needs continued PT services  PT Problem List Decreased strength;Decreased activity tolerance;Decreased balance;Decreased mobility;Decreased knowledge of use of DME;Decreased safety awareness;Decreased knowledge  of precautions;Cardiopulmonary status limiting activity;Impaired sensation       PT Treatment Interventions DME instruction;Gait training;Stair training;Functional mobility training;Therapeutic activities;Therapeutic exercise;Balance training;Neuromuscular re-education;Patient/family education    PT Goals (Current goals can be found in the Care Plan section)  Acute Rehab PT Goals Patient Stated Goal: go to rehab to get better PT Goal Formulation: With patient Time For Goal Achievement: 11/05/22 Potential to Achieve Goals: Good    Frequency Min 3X/week     Co-evaluation               AM-PAC PT "6 Clicks" Mobility  Outcome Measure Help needed turning from your back to your side while in a flat bed without using bedrails?: A Little Help needed moving from lying on your back to sitting on the side of a flat bed without using bedrails?: A Little Help needed moving to and from a bed to a chair (including a wheelchair)?: A Little Help needed standing up from a chair using your arms (e.g., wheelchair or bedside chair)?: A Little Help needed to walk in hospital room?: A Lot Help needed climbing 3-5 steps with a railing? : Total 6 Click Score: 15    End of Session Equipment Utilized During Treatment: Gait belt;Oxygen Activity Tolerance: Patient tolerated treatment well Patient left: in bed;with call bell/phone within reach Nurse Communication: Mobility status PT Visit Diagnosis: Unsteadiness on feet (R26.81);Muscle weakness (generalized) (  M62.81);Repeated falls (R29.6);Difficulty in walking, not elsewhere classified (R26.2)    Time: 1610-9604 PT Time Calculation (min) (ACUTE ONLY): 23 min   Charges:   PT Evaluation $PT Eval Low Complexity: 1 Low          Lindalou Hose, PT DPT Acute Rehabilitation Services Office [phone]   Leonie Man 10/22/2022, 4:29 PM

## 2022-10-22 NOTE — Progress Notes (Signed)
PROGRESS NOTE    [name]  ZOX:[id number] DOB: 11-24-1970 DOA: 10/20/2022 PCP: [name]   Chief Complaint  Patient presents with   Fall    Brief Narrative:   [name] is a 52 y.o. male with medical history significant for end-stage renal disease on hemodialysis, type 2 diabetes mellitus, essential pretension, anemia of chronic kidney disease associated baseline hemoglobin range 8-12, recent hospitalization for MRSA bacteremia, who is admitted to [facility] on 10/20/2022 with anion gap metabolic acidosis after presenting from home to [facility] ED complaining of fall.  Patient recently left AMA from hospital in [place] for which he was diagnosed with MRSA bacteremia, and right AVG function requiring declotting process, he left before finishing his treatment.  Assessment & Plan:   Principal Problem:   MRSA bacteremia Active Problems:   End-stage renal disease on hemodialysis (HCC)   History of anemia due to chronic kidney disease   Essential hypertension   DM2 (diabetes mellitus, type 2) (HCC)   High anion gap metabolic acidosis   Fall   GERD (gastroesophageal reflux disease)   MRSA bacteremia -Has been diagnosed on OSH in [place] in May 2024, apparently patient has been hospitalized there and 3 different hospital where he left AMA. -Continue with IV vancomycin for now. -Reviewing records at OSH, initial recommendation were for IV antibiotics 4 to 6 weeks. -Follow-up blood cultures.,  So far remains negative -ID input greatly appreciated  ESRD -Renal consulted, report last HD was on Thursday, HD per renal -Status post declotting of right forearm AVG, has good bruit is currently  Hypertension -  outpatient antihypertensive regimen including Coreg, Norvasc, isosorbide hydralazine, question of poor compliance with home regimen. -When his cardiomyopathy we will continue with Coreg, and will resume BiDil, and if there is room for blood pressure will uptitrate  both medications for now. -Will continue to hold Norvasc to allow more room to uptitrate Coreg and BiDil.  Acute on chronic systolic/diastolic CHF -2D echo today showing EF 20 to 25%, last January was 25 to 30%, with concern of infiltrative cardiomyopathy, so we will consult cardiology regarding further input. -Volume management with HD. -Continue with Coreg, resume BiDil, will uptitrate dose as able.  Anemia of chronic kidney disease -IV iron and erythropoietin per renal  Diabetes mellitus type 2 -Regimen include Lantus 35 units, most recent A1c 7 point 12/08/2022 -He is on low-dose Lantus here and insulin sliding scale.  GERD -Continue with PPI   DVT prophylaxis: [place] heaprin Code Status: Full Family Communication: none at bedside     Consultants:  Renal ID Cardiology  Subjective:  No significant events overnight as discussed with staff, he refuses to answer any questions today  Objective: Vitals:   10/21/22 2100 10/22/22 0043 10/22/22 0454 10/22/22 0809  BP: (!) 165/81 (!) 153/63  (!) 197/86  Pulse: 91 84  83  Resp: 13 11  20   Temp: 99.9 F (37.7 C) 99.5 F (37.5 C)  98.9 F (37.2 C)  TempSrc: Oral Oral  Oral  SpO2:      Weight:   73.4 kg   Height:        Intake/Output Summary (Last 24 hours) at 10/22/2022 1201 Last data filed at 10/21/2022 1730 Gross per 24 hour  Intake --  Output 4100 ml  Net -4100 ml   Filed Weights   10/21/22 1442 10/21/22 1744 10/22/22 0454  Weight: 77 kg 73 kg 73.4 kg    Examination:  Sleeping, eye closed, refusing to  open eyes and answer questions Symmetrical Chest wall movement, Good air movement bilaterally, CTAB RRR,No Gallops,Rubs or new Murmurs, No Parasternal Heave +ve B.Sounds, Abd Soft, No tenderness, No rebound - guarding or rigidity. The lower extremity edema with woody appearance, right knee bruise secondary to fall    Data Reviewed: I have personally reviewed following labs and imaging studies  CBC: Recent Labs   Lab 10/20/22 1708 10/21/22 0549  WBC 8.5 7.9  NEUTROABS 6.1 5.6  HGB 8.4* 7.9*  HCT 28.7* 25.6*  MCV 90.8 88.3  PLT 454* 435*    Basic Metabolic Panel: Recent Labs  Lab 10/20/22 1708 10/21/22 0549  NA 136 138  K 5.0 4.7  CL 97* 102  CO2 21* 22  GLUCOSE 151* 210*  BUN 60* 65*  CREATININE 9.49* 10.26*  CALCIUM 8.1* 7.7*  MG 2.6* 2.5*  PHOS  --  6.2*    GFR: Estimated Creatinine Clearance: 8.1 mL/min (A) (by C-G formula based on SCr of 10.26 mg/dL (H)).  Liver Function Tests: Recent Labs  Lab 10/21/22 0549  AST 22  ALT 27  ALKPHOS 223*  BILITOT 0.5  PROT 7.0  ALBUMIN 2.7*    CBG: Recent Labs  Lab 10/21/22 1214 10/21/22 1827 10/21/22 1906 10/21/22 2105 10/22/22 0803  GLUCAP 162* 51* 73 106* 136*     Recent Results (from the past 240 hour(s))  Blood culture (routine x 2)     Status: None (Preliminary result)   Collection Time: 10/20/22  6:43 PM   Specimen: BLOOD LEFT ARM  Result Value Ref Range Status   Specimen Description BLOOD LEFT ARM  Final   Special Requests   Final    BOTTLES DRAWN AEROBIC AND ANAEROBIC Blood Culture results may not be optimal due to an inadequate volume of blood received in culture bottles   Culture   Final    NO GROWTH < 24 HOURS Performed at [facility], [address], [place], [place] [address]    Report Status PENDING  Incomplete  Blood culture (routine x 2)     Status: None (Preliminary result)   Collection Time: 10/20/22  9:47 PM   Specimen: BLOOD LEFT HAND  Result Value Ref Range Status   Specimen Description BLOOD LEFT HAND  Final   Special Requests   Final    BOTTLES DRAWN AEROBIC AND ANAEROBIC Blood Culture adequate volume   Culture   Final    NO GROWTH < 12 HOURS Performed at [facility], [address] [place], [place] [address]    Report Status PENDING  Incomplete         Radiology Studies: ECHOCARDIOGRAM COMPLETE  Result Date: 10/22/2022    ECHOCARDIOGRAM REPORT   Patient Name:    [name] Date of Exam: 10/22/2022 Medical Rec #:  [id number]       Height:       67.0 in Accession #:    [id number]      Weight:       161.8 lb Date of Birth:  24-Jan-1971      BSA:          1.848 m Patient Age:    51 years        BP:           197/86 mmHg Patient Gender: M               HR:           86 bpm. Exam Location:  Inpatient Procedure: 2D Echo, Cardiac Doppler and Color Doppler Indications:    Bacteremia  History:        Patient has prior history of Echocardiogram examinations, most                 recent 05/24/2022. CHF, Arrythmias:LBBB, Signs/Symptoms:Shortness                 of Breath; Risk Factors:Hypertension, Diabetes and Dyslipidemia.                 ESRD.  Sonographer:    Lucendia Herrlich Referring Phys: [id number] CORNELIUS N VAN DAM  Sonographer Comments: Image acquisition challenging due to uncooperative patient. IMPRESSIONS  1. There is severe concentric LVH and echogenic myocardium. Consider evalaution for infiltrative CM such as cardiac amyloid. . Left ventricular ejection fraction, by estimation, is 20 to 25%. The left ventricle has severely decreased function. The left ventricle demonstrates global hypokinesis. There is severe concentric left ventricular hypertrophy. Left ventricular diastolic function could not be evaluated.  2. Right ventricular systolic function is severely reduced. The right ventricular size is normal. There is severely elevated pulmonary artery systolic pressure. The estimated right ventricular systolic pressure is 66.6 mmHg.  3. Left atrial size was mildly dilated.  4. Right atrial size was mild to moderately dilated.  5. A small pericardial effusion is present. The pericardial effusion is posterior to the left ventricle. There is no evidence of cardiac tamponade.  6. The mitral valve is normal in structure. No evidence of mitral valve regurgitation. No evidence of mitral stenosis.  7. Tricuspid valve regurgitation is moderate.  8. The aortic valve is normal in  structure. Aortic valve regurgitation is trivial. No aortic stenosis is present.  9. Patient uncooperative. Only parasternal views available. No apical or subcostal windows. FINDINGS  Left Ventricle: There is severe concentric LVH and echogenic myocardium. Consider evalaution for infiltrative CM such as cardiac amyloid. Left ventricular ejection fraction, by estimation, is 20 to 25%. The left ventricle has severely decreased function. The left ventricle demonstrates global hypokinesis. The left ventricular internal cavity size was normal in size. There is severe concentric left ventricular hypertrophy. Left ventricular diastolic function could not be evaluated. Right Ventricle: The right ventricular size is normal. No increase in right ventricular wall thickness. Right ventricular systolic function is severely reduced. There is severely elevated pulmonary artery systolic pressure. The tricuspid regurgitant velocity is 3.59 m/s, and with an assumed right atrial pressure of 15 mmHg, the estimated right ventricular systolic pressure is 66.6 mmHg. Left Atrium: Left atrial size was mildly dilated. Right Atrium: Right atrial size was mild to moderately dilated. Pericardium: A small pericardial effusion is present. The pericardial effusion is posterior to the left ventricle. There is no evidence of cardiac tamponade. Mitral Valve: The mitral valve is normal in structure. No evidence of mitral valve regurgitation. No evidence of mitral valve stenosis. Tricuspid Valve: The tricuspid valve is normal in structure. Tricuspid valve regurgitation is moderate . No evidence of tricuspid stenosis. Aortic Valve: The aortic valve is normal in structure. Aortic valve regurgitation is trivial. No aortic stenosis is present. Pulmonic Valve: The pulmonic valve was normal in structure. Pulmonic valve regurgitation is trivial. No evidence of pulmonic stenosis. Aorta: The aortic root is normal in size and structure. Venous: The inferior vena  cava was not well visualized. IAS/Shunts: No atrial level shunt detected by color flow Doppler.  LEFT VENTRICLE PLAX 2D LVIDd:         4.10 cm  LVIDs:         3.70 cm LV PW:         1.70 cm LV IVS:        1.40 cm LVOT diam:     1.90 cm LVOT Area:     2.84 cm  LEFT ATRIUM         Index LA diam:    3.90 cm 2.11 cm/m   AORTA Ao Asc diam: 3.10 cm TRICUSPID VALVE TR Peak grad:   51.6 mmHg TR Vmax:        359.00 cm/s  SHUNTS Systemic Diam: 1.90 cm Arvilla Meres MD Electronically signed by Arvilla Meres MD Signature Date/Time: 10/22/2022/10:25:20 AM    Final    DG Knee Complete 4 Views Right  Result Date: 10/20/2022 CLINICAL DATA:  Ground level fall.  Right knee pain. EXAM: RIGHT KNEE - COMPLETE 4+ VIEW COMPARISON:  07/11/2022. FINDINGS: No fracture.  No bone lesion. 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 Knee joint normally spaced and aligned. No degenerative/arthropathic changes. No joint effusion. Soft tissues are unremarkable. IMPRESSION: 1. No fracture or acute finding. Electronically Signed   By: Amie Portland M.D.   On: 10/20/2022 14:47   DG Elbow Complete Right  Result Date: 10/20/2022 CLINICAL DATA:  Ground level fall.  Elbow pain. EXAM: RIGHT ELBOW - COMPLETE 3+ VIEW COMPARISON:  03/06/2010. FINDINGS: No acute fracture. Prosthetic radial head appears well seated and aligned. Narrowing of the humeral olecranon joint space with mild associated subchondral sclerosis and marginal osteophytes. No convincing joint effusion. Forearm AV fistula. IMPRESSION: 1. No fracture or acute finding. 2. 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 Well seated and positioned radial head prosthesis. Electronically Signed   By: Amie Portland M.D.   On: 10/20/2022 14:45   DG Chest Portable 1 View  Result Date: 10/20/2022 CLINICAL DATA:  Pain.  Fall.  Dialysis patient. EXAM: PORTABLE CHEST 1 VIEW COMPARISON:  September 17, 2022 FINDINGS: Stable cardiomegaly. No  pneumothorax. No overt edema, nodule, mass, or infiltrate. No acute abnormalities are noted. A nonacute fracture of the distal left clavicle is again identified. Degenerative changes identified in the shoulders. IMPRESSION: 1. No active disease. 2. Nonacute fracture of the distal left clavicle. Electronically Signed   By: Gerome Sam III M.D.   On: 10/20/2022 14:43        Scheduled Meds:  calcitRIOL  0.25 mcg Oral Q T,Th,Sat-1800   calcium acetate  2,001 mg Oral TID WC   carvedilol  6.25 mg Oral BID WC   Chlorhexidine Gluconate Cloth  6 each Topical Q0600   heparin injection (subcutaneous)  5,000 Units Subcutaneous Q8H   insulin aspart  0-6 Units Subcutaneous TID WC   insulin glargine-yfgn  7 Units Subcutaneous QHS   pantoprazole  40 mg Oral Daily   Continuous Infusions:  vancomycin       LOS: 1 day       Huey Bienenstock, MD [facility]   To contact the attending provider between 7A-7P or the covering provider during after hours 7P-7A, please log into the web site [email or web address] and access using universal [place] password for that web site. If you do not have the password, please call the hospital operator.  10/22/2022, 12:01 PM

## 2022-10-22 NOTE — [place] FL2 (Signed)
[place] MEDICAID FL2 LEVEL OF CARE FORM     IDENTIFICATION  Patient Name: [name] Birthdate: 03/23/1971 Sex: male Admission Date (Current Location): 10/20/2022  [facility] and [place] Number:  Producer, television/film/video and Address:  [facility] [place], [address] [place], [place] [address]      Provider Number: [id number]  Attending Physician Name and Address:  [name], Leana Roe, MD  Relative Name and Phone Number:       Current Level of Care: Hospital Recommended Level of Care: Skilled Nursing Facility Prior Approval Number:    Date Approved/Denied:   PASRR Number: [id number]  Discharge Plan: SNF    Current Diagnoses: Patient Active Problem List   Diagnosis Date Noted   MRSA bacteremia 10/20/2022   High anion gap metabolic acidosis 10/20/2022   Fall 10/20/2022   GERD (gastroesophageal reflux disease) 10/20/2022   Hemodialysis AV fistula thrombosis, initial encounter (HCC) 09/11/2022   AV fistula occlusion (HCC) 09/11/2022   CAP (community acquired pneumonia) 05/20/2022   Chronic combined systolic and diastolic congestive heart failure (HCC) 05/20/2022   Anxiety disorder, unspecified 03/26/2022   Hypocalcemia 03/25/2022   Iron deficiency anemia, unspecified 03/20/2022   CHF (congestive heart failure) (HCC) 11/15/2021   Nausea vomiting and diarrhea 11/07/2021   Homelessness 10/13/2021   Acute hypoxic respiratory failure (HCC) 10/13/2021   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) 09/28/2021   Aspiration pneumonia (HCC) 09/28/2021   Volume overload 04/12/2021   Mild protein-calorie malnutrition (HCC) 03/06/2021   Aftercare including intermittent dialysis (HCC) 02/27/2021   Allergy, unspecified, initial encounter 02/27/2021   History of anemia due to chronic kidney disease 02/27/2021   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   Secondary hyperparathyroidism of renal origin (HCC) 02/27/2021   Shortness of breath  02/27/2021   Type 2 diabetes mellitus with other diabetic kidney complication (HCC) 02/27/2021   Hypertensive urgency 02/18/2021   Type 2 diabetes mellitus (HCC) 02/17/2021   Acute on chronic combined systolic and diastolic CHF (congestive heart failure) (HCC) 11/29/2020   Hypertensive crisis 11/27/2020   Stage 4 chronic kidney disease (HCC) 02/22/2020   Left ventricular dysfunction 04/27/2019   Hemoglobin A1C greater than 9%, indicating poor diabetic control 07/29/2018   LBBB (left bundle branch block)    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   Hyperlipidemia LDL goal <70 11/24/2008   DM2 (diabetes mellitus, type 2) (HCC) 07/17/2006   T2DM (type 2 diabetes mellitus) (HCC) 07/17/2006   End-stage renal disease on hemodialysis (HCC) 07/17/2006   Essential hypertension 07/17/2006    Orientation RESPIRATION BLADDER Height & Weight     Self, Time, Situation, Place  Normal Continent Weight: 161 lb 13.1 oz (73.4 kg) Height:  5\' 7"  (170.2 cm)  BEHAVIORAL SYMPTOMS/MOOD NEUROLOGICAL BOWEL NUTRITION STATUS      Continent Diet (See dc summary)  AMBULATORY STATUS COMMUNICATION OF NEEDS Skin   Limited Assist Verbally Normal                       Personal Care Assistance Level of Assistance  Bathing, Feeding, Dressing Bathing Assistance: Independent Feeding assistance: Independent Dressing Assistance: Limited assistance     Functional Limitations Info             SPECIAL CARE FACTORS FREQUENCY  PT (By licensed PT), OT (By licensed OT)     PT Frequency: 5x/week OT Frequency: 5x/week            Contractures  Contractures Info: Not present    Additional Factors Info  Code Status, Allergies, Isolation Precautions, Insulin Sliding Scale Code Status Info: Full Allergies Info: Sulfa Antibiotics, Other, Tuberculin Ppd, Bactrim (Sulfamethoxazole-trimethoprim), Cephalexin   Insulin Sliding Scale Info: See dc summary Isolation Precautions Info: MRSA     Current  Medications (10/22/2022):  This is the current hospital active medication list Current Facility-Administered Medications  Medication Dose Route Frequency Provider Last Rate Last Admin   acetaminophen (TYLENOL) tablet 650 mg  650 mg Oral Q6H PRN Howerter, Justin B, DO       Or   acetaminophen (TYLENOL) suppository 650 mg  650 mg Rectal Q6H PRN Howerter, Justin B, DO       calcitRIOL (ROCALTROL) capsule 0.25 mcg  0.25 mcg Oral Q T,Th,Sat-1800 Delano Metz, MD       calcium acetate (PHOSLO) capsule 2,001 mg  2,001 mg Oral TID WC Howerter, Justin B, DO   2,001 mg at 10/22/22 1258   carvedilol (COREG) tablet 6.25 mg  6.25 mg Oral BID WC Howerter, Justin B, DO   6.25 mg at 10/22/22 9604   Chlorhexidine Gluconate Cloth 2 % PADS 6 each  6 each Topical Q0600 Delano Metz, MD   6 each at 10/22/22 0531   heparin injection 5,000 Units  5,000 Units Subcutaneous Q8H [name], Leana Roe, MD   5,000 Units at 10/22/22 1258   hydrALAZINE (APRESOLINE) injection 5 mg  5 mg Intravenous Q4H PRN [name], Leana Roe, MD       insulin aspart (novoLOG) injection 0-6 Units  0-6 Units Subcutaneous TID WC Howerter, Justin B, DO   1 Units at 10/21/22 1300   insulin glargine-yfgn (SEMGLEE) injection 7 Units  7 Units Subcutaneous QHS Howerter, Justin B, DO   7 Units at 10/21/22 2110   isosorbide-hydrALAZINE (BIDIL) 20-37.5 MG per tablet 2 tablet  2 tablet Oral TID [name], Leana Roe, MD   2 tablet at 10/22/22 1258   melatonin tablet 3 mg  3 mg Oral QHS PRN Howerter, Justin B, DO       ondansetron (ZOFRAN) injection 4 mg  4 mg Intravenous Q6H PRN Howerter, Justin B, DO   4 mg at 10/21/22 1433   oxyCODONE-acetaminophen (PERCOCET/ROXICET) 5-325 MG per tablet 1 tablet  1 tablet Oral Q12H PRN Howerter, Justin B, DO       pantoprazole (PROTONIX) EC tablet 40 mg  40 mg Oral Daily Howerter, Justin B, DO   40 mg at 10/22/22 5409   vancomycin (VANCOREADY) IVPB 750 mg/150 mL  750 mg Intravenous Q T,Th,Sa-HD [name], [facility]          Discharge Medications: Please see discharge summary for a list of discharge medications.  Relevant Imaging Results:  Relevant Lab Results:   Additional Information SSN: [id number]. Requires transport to dialysis at [facility] 6:20 am arrival for 6:40 am chair time (will receive IV abx during dialysis sessions)  Mearl Latin, LCSW

## 2022-10-22 NOTE — Progress Notes (Signed)
Subjective: No new complaints   Antibiotics:  Anti-infectives (From admission, onward)    Start     Dose/Rate Route Frequency Ordered Stop   10/22/22 1200  vancomycin (VANCOREADY) IVPB 750 mg/150 mL        750 mg 150 mL/hr over 60 Minutes Intravenous Every T-Th-Sa (Hemodialysis) 10/20/22 2111     10/21/22 1800  vancomycin (VANCOREADY) IVPB 750 mg/150 mL        750 mg 150 mL/hr over 60 Minutes Intravenous  Once 10/21/22 1327 10/21/22 1712   10/21/22 1200  vancomycin (VANCOREADY) IVPB 750 mg/150 mL  Status:  Discontinued        750 mg 150 mL/hr over 60 Minutes Intravenous To Hemodialysis 10/20/22 2111 10/21/22 1327       Medications: Scheduled Meds:  calcitRIOL  0.25 mcg Oral Q T,Th,Sat-1800   calcium acetate  2,001 mg Oral TID WC   carvedilol  6.25 mg Oral BID WC   Chlorhexidine Gluconate Cloth  6 each Topical Q0600   heparin injection (subcutaneous)  5,000 Units Subcutaneous Q8H   insulin aspart  0-6 Units Subcutaneous TID WC   insulin glargine-yfgn  7 Units Subcutaneous QHS   isosorbide-hydrALAZINE  2 tablet Oral TID   pantoprazole  40 mg Oral Daily   Continuous Infusions:  vancomycin     PRN Meds:.acetaminophen **OR** acetaminophen, hydrALAZINE, melatonin, ondansetron (ZOFRAN) IV, oxyCODONE-acetaminophen    Objective: Weight change: 4.425 kg  Intake/Output Summary (Last 24 hours) at 10/22/2022 1319 Last data filed at 10/21/2022 1730 Gross per 24 hour  Intake --  Output 4100 ml  Net -4100 ml   Blood pressure (!) 190/76, pulse 79, temperature 99.1 F (37.3 C), temperature source Oral, resp. rate 18, height 5\' 7"  (1.702 m), weight 73.4 kg, SpO2 100 %. Temp:  [98.1 F (36.7 C)-99.9 F (37.7 C)] 99.1 F (37.3 C) (06/04 1239) Pulse Rate:  [78-91] 79 (06/04 1239) Resp:  [11-20] 18 (06/04 1239) BP: (117-197)/(50-90) 190/76 (06/04 1239) SpO2:  [98 %-100 %] 100 % (06/03 1730) Weight:  [73 kg-77 kg] 73.4 kg (06/04 0454)  Physical Exam: Physical  Exam Constitutional:      Appearance: He is well-developed. He is obese.  HENT:     Head: Normocephalic and atraumatic.  Eyes:     Conjunctiva/sclera: Conjunctivae normal.  Cardiovascular:     Rate and Rhythm: Normal rate and regular rhythm.     Heart sounds: No murmur heard.    No friction rub. No gallop.  Pulmonary:     Effort: Pulmonary effort is normal. No respiratory distress.     Breath sounds: Normal breath sounds. No stridor. No wheezing or rhonchi.  Abdominal:     General: There is no distension.     Palpations: Abdomen is soft.  Musculoskeletal:        General: Normal range of motion.     Cervical back: Normal range of motion and neck supple.  Skin:    General: Skin is warm and dry.     Findings: No erythema or rash.  Neurological:     General: No focal deficit present.     Mental Status: He is alert and oriented to person, place, and time.  Psychiatric:        Mood and Affect: Mood normal.        Behavior: Behavior normal.        Thought Content: Thought content normal.        Judgment: Judgment normal.  AVG site bandaged  Nasal lesion    CBC:    BMET Recent Labs    10/20/22 1708 10/21/22 0549  NA 136 138  K 5.0 4.7  CL 97* 102  CO2 21* 22  GLUCOSE 151* 210*  BUN 60* 65*  CREATININE 9.49* 10.26*  CALCIUM 8.1* 7.7*     Liver Panel  Recent Labs    10/21/22 0549  PROT 7.0  ALBUMIN 2.7*  AST 22  ALT 27  ALKPHOS 223*  BILITOT 0.5       Sedimentation Rate No results for input(s): "ESRSEDRATE" in the last 72 hours. C-Reactive Protein No results for input(s): "CRP" in the last 72 hours.  Micro Results: Recent Results (from the past 720 hour(s))  Blood culture (routine x 2)     Status: None (Preliminary result)   Collection Time: 10/20/22  6:43 PM   Specimen: BLOOD LEFT ARM  Result Value Ref Range Status   Specimen Description BLOOD LEFT ARM  Final   Special Requests   Final    BOTTLES DRAWN AEROBIC AND ANAEROBIC Blood Culture  results may not be optimal due to an inadequate volume of blood received in culture bottles   Culture   Final    NO GROWTH < 24 HOURS Performed at [facility], [address], [place], [place] [address]    Report Status PENDING  Incomplete  Blood culture (routine x 2)     Status: None (Preliminary result)   Collection Time: 10/20/22  9:47 PM   Specimen: BLOOD LEFT HAND  Result Value Ref Range Status   Specimen Description BLOOD LEFT HAND  Final   Special Requests   Final    BOTTLES DRAWN AEROBIC AND ANAEROBIC Blood Culture adequate volume   Culture   Final    NO GROWTH < 12 HOURS Performed at [facility], [address] [place], [place] [address]    Report Status PENDING  Incomplete    Studies/Results: ECHOCARDIOGRAM COMPLETE  Result Date: 10/22/2022    ECHOCARDIOGRAM REPORT   Patient Name:   [name] Date of Exam: 10/22/2022 Medical Rec #:  [id number]       Height:       67.0 in Accession #:    [id number]      Weight:       161.8 lb Date of Birth:  07-08-1970      BSA:          1.848 m Patient Age:    51 years        BP:           197/86 mmHg Patient Gender: M               HR:           86 bpm. Exam Location:  Inpatient Procedure: 2D Echo, Cardiac Doppler and Color Doppler Indications:    Bacteremia  History:        Patient has prior history of Echocardiogram examinations, most                 recent 05/24/2022. CHF, Arrythmias:LBBB, Signs/Symptoms:Shortness                 of Breath; Risk Factors:Hypertension, Diabetes and Dyslipidemia.                 ESRD.  Sonographer:    Lucendia Herrlich Referring Phys: [id number] Naylene Foell N VAN DAM  Sonographer Comments: Image acquisition challenging due to  uncooperative patient. IMPRESSIONS  1. There is severe concentric LVH and echogenic myocardium. Consider evalaution for infiltrative CM such as cardiac amyloid. . Left ventricular ejection fraction, by estimation, is 20 to 25%. The left ventricle has severely decreased function. The left  ventricle demonstrates global hypokinesis. There is severe concentric left ventricular hypertrophy. Left ventricular diastolic function could not be evaluated.  2. Right ventricular systolic function is severely reduced. The right ventricular size is normal. There is severely elevated pulmonary artery systolic pressure. The estimated right ventricular systolic pressure is 66.6 mmHg.  3. Left atrial size was mildly dilated.  4. Right atrial size was mild to moderately dilated.  5. A small pericardial effusion is present. The pericardial effusion is posterior to the left ventricle. There is no evidence of cardiac tamponade.  6. The mitral valve is normal in structure. No evidence of mitral valve regurgitation. No evidence of mitral stenosis.  7. Tricuspid valve regurgitation is moderate.  8. The aortic valve is normal in structure. Aortic valve regurgitation is trivial. No aortic stenosis is present.  9. Patient uncooperative. Only parasternal views available. No apical or subcostal windows. FINDINGS  Left Ventricle: There is severe concentric LVH and echogenic myocardium. Consider evalaution for infiltrative CM such as cardiac amyloid. Left ventricular ejection fraction, by estimation, is 20 to 25%. The left ventricle has severely decreased function. The left ventricle demonstrates global hypokinesis. The left ventricular internal cavity size was normal in size. There is severe concentric left ventricular hypertrophy. Left ventricular diastolic function could not be evaluated. Right Ventricle: The right ventricular size is normal. No increase in right ventricular wall thickness. Right ventricular systolic function is severely reduced. There is severely elevated pulmonary artery systolic pressure. The tricuspid regurgitant velocity is 3.59 m/s, and with an assumed right atrial pressure of 15 mmHg, the estimated right ventricular systolic pressure is 66.6 mmHg. Left Atrium: Left atrial size was mildly dilated. Right  Atrium: Right atrial size was mild to moderately dilated. Pericardium: A small pericardial effusion is present. The pericardial effusion is posterior to the left ventricle. There is no evidence of cardiac tamponade. Mitral Valve: The mitral valve is normal in structure. No evidence of mitral valve regurgitation. No evidence of mitral valve stenosis. Tricuspid Valve: The tricuspid valve is normal in structure. Tricuspid valve regurgitation is moderate . No evidence of tricuspid stenosis. Aortic Valve: The aortic valve is normal in structure. Aortic valve regurgitation is trivial. No aortic stenosis is present. Pulmonic Valve: The pulmonic valve was normal in structure. Pulmonic valve regurgitation is trivial. No evidence of pulmonic stenosis. Aorta: The aortic root is normal in size and structure. Venous: The inferior vena cava was not well visualized. IAS/Shunts: No atrial level shunt detected by color flow Doppler.  LEFT VENTRICLE PLAX 2D LVIDd:         4.10 cm LVIDs:         3.70 cm LV PW:         1.70 cm LV IVS:        1.40 cm LVOT diam:     1.90 cm LVOT Area:     2.84 cm  LEFT ATRIUM         Index LA diam:    3.90 cm 2.11 cm/m   AORTA Ao Asc diam: 3.10 cm TRICUSPID VALVE TR Peak grad:   51.6 mmHg TR Vmax:        359.00 cm/s  SHUNTS Systemic Diam: 1.90 cm Arvilla Meres MD Electronically signed by Arvilla Meres MD  Signature Date/Time: 10/22/2022/10:25:20 AM    Final    DG Knee Complete 4 Views Right  Result Date: 10/20/2022 CLINICAL DATA:  Ground level fall.  Right knee pain. EXAM: RIGHT KNEE - COMPLETE 4+ VIEW COMPARISON:  07/11/2022. FINDINGS: No fracture.  No bone lesion. 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 Knee joint normally spaced and aligned. No degenerative/arthropathic changes. No joint effusion. Soft tissues are unremarkable. IMPRESSION: 1. No fracture or acute finding. Electronically Signed   By: Amie Portland M.D.   On: 10/20/2022 14:47   DG Elbow  Complete Right  Result Date: 10/20/2022 CLINICAL DATA:  Ground level fall.  Elbow pain. EXAM: RIGHT ELBOW - COMPLETE 3+ VIEW COMPARISON:  03/06/2010. FINDINGS: No acute fracture. Prosthetic radial head appears well seated and aligned. Narrowing of the humeral olecranon joint space with mild associated subchondral sclerosis and marginal osteophytes. No convincing joint effusion. Forearm AV fistula. IMPRESSION: 1. No fracture or acute finding. 2. 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 Well seated and positioned radial head prosthesis. Electronically Signed   By: Amie Portland M.D.   On: 10/20/2022 14:45   DG Chest Portable 1 View  Result Date: 10/20/2022 CLINICAL DATA:  Pain.  Fall.  Dialysis patient. EXAM: PORTABLE CHEST 1 VIEW COMPARISON:  September 17, 2022 FINDINGS: Stable cardiomegaly. No pneumothorax. No overt edema, nodule, mass, or infiltrate. No acute abnormalities are noted. A nonacute fracture of the distal left clavicle is again identified. Degenerative changes identified in the shoulders. IMPRESSION: 1. No active disease. 2. Nonacute fracture of the distal left clavicle. Electronically Signed   By: Gerome Sam III M.D.   On: 10/20/2022 14:43      Assessment/Plan:  INTERVAL HISTORY: sp HD yesterday   Principal Problem:   MRSA bacteremia Active Problems:   History of anemia due to chronic kidney disease   DM2 (diabetes mellitus, type 2) (HCC)   End-stage renal disease on hemodialysis (HCC)   Essential hypertension   High anion gap metabolic acidosis   Fall   GERD (gastroesophageal reflux disease)    [name] is a 52 y.o. male with tree of end-stage renal disease on hemodialysis through AVG  diabetes mellitus who was hospitalized several times in [place] where he also left AGAINST MEDICAL ADVICE several times with initially some concern with regards to his soft tissue infection in his arm later found to be bacteremic with MRSA.  He was seen by  infectious disease there who had some concern about whether the AV fistula might be infected. They made local VVS surgeons aware who planted declotting procedures on this site  #1 MRSA bacteremia  I would feel more comfortable if one of OUR VVS surgeons evaluated his AVG site since there suspicion of infection and it before and we do not have a clear-cut source other than him being on dialysis for his bacteremia.  The echocardiogram without vegetations but the small pericardial effusion  Will likely need a transesophageal echocardiogram  I would plan on giving him 6 weeks of treatment.  #2 Nasal lesion not clear that this is truly an abscess, can monitor  #3 Homelessness: agree that SNF would be good option for him.  I have personally spent 52 minutes involved in face-to-face and non-face-to-face activities for this patient on the day of the visit. Professional time spent includes the following activities: Preparing to see the patient (review of tests), Obtaining and/or reviewing separately obtained history (admission/discharge record), Performing a  medically appropriate examination and/or evaluation , Ordering medications/tests/procedures, referring and communicating with other health care professionals, Documenting clinical information in the EMR, Independently interpreting results (not separately reported), Communicating results to the patient/family/caregiver, Counseling and educating the patient/family/caregiver and Care coordination (not separately reported).     LOS: 1 day   Acey Lav 10/22/2022, 1:19 PM

## 2022-10-23 ENCOUNTER — Inpatient Hospital Stay (HOSPITAL_COMMUNITY): Payer: Medicaid Other

## 2022-10-23 DIAGNOSIS — E8729 Other acidosis: Secondary | ICD-10-CM | POA: Diagnosis not present

## 2022-10-23 DIAGNOSIS — N186 End stage renal disease: Secondary | ICD-10-CM | POA: Diagnosis not present

## 2022-10-23 DIAGNOSIS — R7881 Bacteremia: Secondary | ICD-10-CM | POA: Diagnosis not present

## 2022-10-23 DIAGNOSIS — W19XXXA Unspecified fall, initial encounter: Secondary | ICD-10-CM | POA: Diagnosis not present

## 2022-10-23 DIAGNOSIS — T827XXA Infection and inflammatory reaction due to other cardiac and vascular devices, implants and grafts, initial encounter: Secondary | ICD-10-CM

## 2022-10-23 DIAGNOSIS — I5021 Acute systolic (congestive) heart failure: Secondary | ICD-10-CM | POA: Diagnosis not present

## 2022-10-23 DIAGNOSIS — K219 Gastro-esophageal reflux disease without esophagitis: Secondary | ICD-10-CM | POA: Diagnosis not present

## 2022-10-23 DIAGNOSIS — E119 Type 2 diabetes mellitus without complications: Secondary | ICD-10-CM | POA: Diagnosis not present

## 2022-10-23 LAB — GLUCOSE, CAPILLARY
Glucose-Capillary: 104 mg/dL — ABNORMAL HIGH (ref 70–99)
Glucose-Capillary: 203 mg/dL — ABNORMAL HIGH (ref 70–99)
Glucose-Capillary: 157 mg/dL — ABNORMAL HIGH (ref 70–99)

## 2022-10-23 MED ORDER — CARVEDILOL 25 MG PO TABS
25.0000 mg | ORAL_TABLET | Freq: Two times a day (BID) | ORAL | Status: DC
  Filled 2022-10-23 (×2): qty 1

## 2022-10-23 NOTE — Progress Notes (Signed)
Subjective:  No new complaints  Antibiotics:  Anti-infectives (From admission, onward)    Start     Dose/Rate Route Frequency Ordered Stop   10/22/22 1200  vancomycin (VANCOREADY) IVPB 750 mg/150 mL        750 mg 150 mL/hr over 60 Minutes Intravenous Every T-Th-Sa (Hemodialysis) 10/20/22 2111     10/21/22 1800  vancomycin (VANCOREADY) IVPB 750 mg/150 mL        750 mg 150 mL/hr over 60 Minutes Intravenous  Once 10/21/22 1327 10/21/22 1712   10/21/22 1200  vancomycin (VANCOREADY) IVPB 750 mg/150 mL  Status:  Discontinued        750 mg 150 mL/hr over 60 Minutes Intravenous To Hemodialysis 10/20/22 2111 10/21/22 1327       Medications: Scheduled Meds:  calcitRIOL  0.25 mcg Oral Q T,Th,Sat-1800   calcium acetate  2,001 mg Oral TID WC   carvedilol  25 mg Oral BID WC   Chlorhexidine Gluconate Cloth  6 each Topical Q0600   heparin injection (subcutaneous)  5,000 Units Subcutaneous Q8H   hydrALAZINE  100 mg Oral Q8H   insulin aspart  0-6 Units Subcutaneous TID WC   insulin glargine-yfgn  7 Units Subcutaneous QHS   isosorbide mononitrate  60 mg Oral Daily   pantoprazole  40 mg Oral Daily   Continuous Infusions:  vancomycin     PRN Meds:.acetaminophen **OR** acetaminophen, hydrALAZINE, melatonin, ondansetron (ZOFRAN) IV, oxyCODONE-acetaminophen    Objective: Weight change: -2.9 kg  Intake/Output Summary (Last 24 hours) at 10/23/2022 1242 Last data filed at 10/23/2022 0900 Gross per 24 hour  Intake 240 ml  Output 0 ml  Net 240 ml    Blood pressure (!) 179/91, pulse 89, temperature 98.3 F (36.8 C), temperature source Oral, resp. rate 17, height 5\' 7"  (1.702 m), weight 74.1 kg, SpO2 95 %. Temp:  [98 F (36.7 C)-98.3 F (36.8 C)] 98.3 F (36.8 C) (06/05 0746) Pulse Rate:  [73-89] 89 (06/05 0746) Resp:  [12-17] 17 (06/05 0746) BP: (141-179)/(68-91) 179/91 (06/05 0746) SpO2:  [95 %-100 %] 95 % (06/05 0007) Weight:  [74.1 kg] 74.1 kg (06/05 0500)  Physical  Exam: Physical Exam Constitutional:      Appearance: He is well-developed.  HENT:     Head: Normocephalic and atraumatic.  Eyes:     Conjunctiva/sclera: Conjunctivae normal.  Cardiovascular:     Rate and Rhythm: Normal rate and regular rhythm.  Pulmonary:     Effort: Pulmonary effort is normal. No respiratory distress.     Breath sounds: No wheezing.  Abdominal:     General: There is no distension.     Palpations: Abdomen is soft.  Musculoskeletal:        General: Normal range of motion.     Cervical back: Normal range of motion and neck supple.  Skin:    General: Skin is warm and dry.     Findings: No erythema or rash.  Neurological:     General: No focal deficit present.     Mental Status: He is alert and oriented to person, place, and time.  Psychiatric:        Mood and Affect: Mood normal.        Behavior: Behavior normal.        Thought Content: Thought content normal.        Judgment: Judgment normal.    AVG site bandaged  Nasal lesion    Right AVG not overtly infected at  this time  CBC:    BMET Recent Labs    10/20/22 1708 10/21/22 0549  NA 136 138  K 5.0 4.7  CL 97* 102  CO2 21* 22  GLUCOSE 151* 210*  BUN 60* 65*  CREATININE 9.49* 10.26*  CALCIUM 8.1* 7.7*      Liver Panel  Recent Labs    10/21/22 0549  PROT 7.0  ALBUMIN 2.7*  AST 22  ALT 27  ALKPHOS 223*  BILITOT 0.5        Sedimentation Rate No results for input(s): "ESRSEDRATE" in the last 72 hours. C-Reactive Protein No results for input(s): "CRP" in the last 72 hours.  Micro Results: Recent Results (from the past 720 hour(s))  Blood culture (routine x 2)     Status: None (Preliminary result)   Collection Time: 10/20/22  6:43 PM   Specimen: BLOOD LEFT ARM  Result Value Ref Range Status   Specimen Description BLOOD LEFT ARM  Final   Special Requests   Final    BOTTLES DRAWN AEROBIC AND ANAEROBIC Blood Culture results may not be optimal due to an inadequate volume of  blood received in culture bottles   Culture   Final    NO GROWTH 3 DAYS Performed at [facility], [address], [place], [place] [address]    Report Status PENDING  Incomplete  Blood culture (routine x 2)     Status: None (Preliminary result)   Collection Time: 10/20/22  9:47 PM   Specimen: BLOOD LEFT HAND  Result Value Ref Range Status   Specimen Description BLOOD LEFT HAND  Final   Special Requests   Final    BOTTLES DRAWN AEROBIC AND ANAEROBIC Blood Culture adequate volume   Culture   Final    NO GROWTH 3 DAYS Performed at [facility], [address], [place], [place] [address]    Report Status PENDING  Incomplete    Studies/Results: ECHOCARDIOGRAM COMPLETE  Result Date: 10/22/2022    ECHOCARDIOGRAM REPORT   Patient Name:   [name] Date of Exam: 10/22/2022 Medical Rec #:  [id number]       Height:       67.0 in Accession #:    [id number]      Weight:       161.8 lb Date of Birth:  25-Nov-1970      BSA:          1.848 m Patient Age:    51 years        BP:           197/86 mmHg Patient Gender: M               HR:           86 bpm. Exam Location:  Inpatient Procedure: 2D Echo, Cardiac Doppler and Color Doppler Indications:    Bacteremia  History:        Patient has prior history of Echocardiogram examinations, most                 recent 05/24/2022. CHF, Arrythmias:LBBB, Signs/Symptoms:Shortness                 of Breath; Risk Factors:Hypertension, Diabetes and Dyslipidemia.                 ESRD.  Sonographer:    Lucendia Herrlich Referring Phys: [id number] Annitta Fifield N VAN DAM  Sonographer Comments: Image acquisition challenging due to uncooperative patient. IMPRESSIONS  1. There  is severe concentric LVH and echogenic myocardium. Consider evalaution for infiltrative CM such as cardiac amyloid. . Left ventricular ejection fraction, by estimation, is 20 to 25%. The left ventricle has severely decreased function. The left ventricle demonstrates global hypokinesis. There is severe  concentric left ventricular hypertrophy. Left ventricular diastolic function could not be evaluated.  2. Right ventricular systolic function is severely reduced. The right ventricular size is normal. There is severely elevated pulmonary artery systolic pressure. The estimated right ventricular systolic pressure is 66.6 mmHg.  3. Left atrial size was mildly dilated.  4. Right atrial size was mild to moderately dilated.  5. A small pericardial effusion is present. The pericardial effusion is posterior to the left ventricle. There is no evidence of cardiac tamponade.  6. The mitral valve is normal in structure. No evidence of mitral valve regurgitation. No evidence of mitral stenosis.  7. Tricuspid valve regurgitation is moderate.  8. The aortic valve is normal in structure. Aortic valve regurgitation is trivial. No aortic stenosis is present.  9. Patient uncooperative. Only parasternal views available. No apical or subcostal windows. FINDINGS  Left Ventricle: There is severe concentric LVH and echogenic myocardium. Consider evalaution for infiltrative CM such as cardiac amyloid. Left ventricular ejection fraction, by estimation, is 20 to 25%. The left ventricle has severely decreased function. The left ventricle demonstrates global hypokinesis. The left ventricular internal cavity size was normal in size. There is severe concentric left ventricular hypertrophy. Left ventricular diastolic function could not be evaluated. Right Ventricle: The right ventricular size is normal. No increase in right ventricular wall thickness. Right ventricular systolic function is severely reduced. There is severely elevated pulmonary artery systolic pressure. The tricuspid regurgitant velocity is 3.59 m/s, and with an assumed right atrial pressure of 15 mmHg, the estimated right ventricular systolic pressure is 66.6 mmHg. Left Atrium: Left atrial size was mildly dilated. Right Atrium: Right atrial size was mild to moderately dilated.  Pericardium: A small pericardial effusion is present. The pericardial effusion is posterior to the left ventricle. There is no evidence of cardiac tamponade. Mitral Valve: The mitral valve is normal in structure. No evidence of mitral valve regurgitation. No evidence of mitral valve stenosis. Tricuspid Valve: The tricuspid valve is normal in structure. Tricuspid valve regurgitation is moderate . No evidence of tricuspid stenosis. Aortic Valve: The aortic valve is normal in structure. Aortic valve regurgitation is trivial. No aortic stenosis is present. Pulmonic Valve: The pulmonic valve was normal in structure. Pulmonic valve regurgitation is trivial. No evidence of pulmonic stenosis. Aorta: The aortic root is normal in size and structure. Venous: The inferior vena cava was not well visualized. IAS/Shunts: No atrial level shunt detected by color flow Doppler.  LEFT VENTRICLE PLAX 2D LVIDd:         4.10 cm LVIDs:         3.70 cm LV PW:         1.70 cm LV IVS:        1.40 cm LVOT diam:     1.90 cm LVOT Area:     2.84 cm  LEFT ATRIUM         Index LA diam:    3.90 cm 2.11 cm/m   AORTA Ao Asc diam: 3.10 cm TRICUSPID VALVE TR Peak grad:   51.6 mmHg TR Vmax:        359.00 cm/s  SHUNTS Systemic Diam: 1.90 cm Arvilla Meres MD Electronically signed by Arvilla Meres MD Signature Date/Time: 10/22/2022/10:25:20 AM  Final       Assessment/Plan:  INTERVAL HISTORY: possible TEE tomorrow   Principal Problem:   MRSA bacteremia Active Problems:   History of anemia due to chronic kidney disease   DM2 (diabetes mellitus, type 2) (HCC)   End-stage renal disease on hemodialysis (HCC)   Essential hypertension   High anion gap metabolic acidosis   Fall   GERD (gastroesophageal reflux disease)    [name] is a 52 y.o. male with tree of end-stage renal disease on hemodialysis through AVG  diabetes mellitus who was hospitalized several times in [place] where he also left AGAINST MEDICAL ADVICE several  times with initially some concern with regards to his soft tissue infection in his arm later found to be bacteremic with MRSA.  He was seen by infectious disease there who had some concern about whether the AV fistula might be infected. They made local [facility]VS surgeons aware who planted declotting procedures on this site  #1 MRSA bacteremia  I would feel more comfortable if one of OUR [facility] surgeons evaluated his AVG site since there suspicion of infection and it before and we do not have a clear-cut source other than him being on dialysis for his bacteremia.  The echocardiogram without vegetations but the small pericardial effusion  transesophageal echocardiogram pending  I would plan on giving him 6 weeks of treatment.and certainly  if AVG not removed and it remains suspect would plan on chronic suppression  #2 Nasal lesion not clear that this is truly an abscess, can monitor  #3 Homelessness: agree that SNF would be good option for him.   I have personally spent 54 minutes involved in face-to-face and non-face-to-face activities for this patient on the day of the visit. Professional time spent includes the following activities: Preparing to see the patient (review of tests), Obtaining and/or reviewing separately obtained history (admission/discharge record), Performing a medically appropriate examination and/or evaluation , Ordering medications/tests/procedures, referring and communicating with other health care professionals, Documenting clinical information in the EMR, Independently interpreting results (not separately reported), Communicating results to the patient/family/caregiver, Counseling and educating the patient/family/caregiver and Care coordination (not separately reported).   Dr. Drue Second is covering tomorrow.    LOS: 2 days   Acey Lav 10/23/2022, 12:42 PM

## 2022-10-23 NOTE — Progress Notes (Addendum)
   [facility] has been requested to perform a transesophageal echocardiogram on Stephaun R Aquilino for evaluation of bacteremia.    After careful review of history and examination, the risks and benefits of transesophageal echocardiogram have been explained including risks of esophageal damage, perforation (1:10,000 risk), bleeding, pharyngeal hematoma as well as other potential complications associated with conscious sedation including aspiration, arrhythmia, respiratory failure and death. Alternatives to treatment were discussed, questions were answered. Patient is willing to proceed.   TEE is scheduled for 6/7 at 1315.   Jonita Albee, PA-C 10/23/2022 11:11 AM

## 2022-10-23 NOTE — Progress Notes (Signed)
Cardiology Progress Note  Patient ID: [name] MRN: [id number] DOB: 1970/10/14 Date of Encounter: 10/23/2022  Primary Cardiologist: Nanetta Batty, MD  Subjective   Chief Complaint: None.   HPI: Remains volume overloaded.  Denies chest pains.  Reports he does get short of breath with taking his oxygen off.  ROS:  All other ROS reviewed and negative. Pertinent positives noted in the HPI.     Inpatient Medications  Scheduled Meds:  calcitRIOL  0.25 mcg Oral Q T,Th,Sat-1800   calcium acetate  2,001 mg Oral TID WC   carvedilol  6.25 mg Oral BID WC   Chlorhexidine Gluconate Cloth  6 each Topical Q0600   heparin injection (subcutaneous)  5,000 Units Subcutaneous Q8H   hydrALAZINE  100 mg Oral Q8H   insulin aspart  0-6 Units Subcutaneous TID WC   insulin glargine-yfgn  7 Units Subcutaneous QHS   isosorbide mononitrate  60 mg Oral Daily   pantoprazole  40 mg Oral Daily   Continuous Infusions:  vancomycin     PRN Meds: acetaminophen **OR** acetaminophen, hydrALAZINE, melatonin, ondansetron (ZOFRAN) IV, oxyCODONE-acetaminophen   Vital Signs   Vitals:   10/23/22 0007 10/23/22 0500 10/23/22 0625 10/23/22 0746  BP: (!) 154/75  (!) 141/68 (!) 179/91  Pulse: 73   89  Resp: 17   17  Temp: 98.1 F (36.7 C)   98.3 F (36.8 C)  TempSrc: Oral   Oral  SpO2: 95%     Weight:  74.1 kg    Height:        Intake/Output Summary (Last 24 hours) at 10/23/2022 0900 Last data filed at 10/22/2022 1300 Gross per 24 hour  Intake 120 ml  Output 0 ml  Net 120 ml      10/23/2022    5:00 AM 10/22/2022    4:54 AM 10/21/2022    5:44 PM  Last 3 Weights  Weight (lbs) 163 lb 5.8 oz 161 lb 13.1 oz 160 lb 15 oz  Weight (kg) 74.1 kg 73.4 kg 73 kg      Telemetry  Overnight telemetry shows sinus rhythm 70s, which I personally reviewed.      Physical Exam   Vitals:   10/23/22 0007 10/23/22 0500 10/23/22 0625 10/23/22 0746  BP: (!) 154/75  (!) 141/68 (!) 179/91  Pulse: 73   89  Resp: 17   17   Temp: 98.1 F (36.7 C)   98.3 F (36.8 C)  TempSrc: Oral   Oral  SpO2: 95%     Weight:  74.1 kg    Height:        Intake/Output Summary (Last 24 hours) at 10/23/2022 0900 Last data filed at 10/22/2022 1300 Gross per 24 hour  Intake 120 ml  Output 0 ml  Net 120 ml       10/23/2022    5:00 AM 10/22/2022    4:54 AM 10/21/2022    5:44 PM  Last 3 Weights  Weight (lbs) 163 lb 5.8 oz 161 lb 13.1 oz 160 lb 15 oz  Weight (kg) 74.1 kg 73.4 kg 73 kg    Body mass index is 25.59 kg/m.  General: Well nourished, well developed, in no acute distress Head: Atraumatic, normal size  Eyes: PEERLA, EOMI  Neck: Supple, JVD 12-15 cmH2O Endocrine: No thryomegaly Cardiac: Normal S1, S2; RRR; 3 out of 6 holosystolic murmur Lungs: Clear to auscultation bilaterally, no wheezing, rhonchi or rales  Abd: Soft, nontender, no hepatomegaly  Ext: No edema, pulses 2+ Musculoskeletal: No  deformities, BUE and BLE strength normal and equal Skin: Warm and dry, no rashes   Neuro: Alert and oriented to person, place, time, and situation, CNII-XII grossly intact, no focal deficits  Psych: Normal mood and affect   Labs  High Sensitivity Troponin:  No results for input(s): "TROPONINIHS" in the last 720 hours.   Cardiac EnzymesNo results for input(s): "TROPONINI" in the last 168 hours. No results for input(s): "TROPIPOC" in the last 168 hours.  Chemistry Recent Labs  Lab 10/20/22 1708 10/21/22 0549  NA 136 138  K 5.0 4.7  CL 97* 102  CO2 21* 22  GLUCOSE 151* 210*  BUN 60* 65*  CREATININE 9.49* 10.26*  CALCIUM 8.1* 7.7*  PROT  --  7.0  ALBUMIN  --  2.7*  AST  --  22  ALT  --  27  ALKPHOS  --  223*  BILITOT  --  0.5  GFRNONAA 6* 6*  ANIONGAP 18* 14    Hematology Recent Labs  Lab 10/20/22 1708 10/21/22 0549  WBC 8.5 7.9  RBC 3.16* 2.90*  HGB 8.4* 7.9*  HCT 28.7* 25.6*  MCV 90.8 88.3  MCH 26.6 27.2  MCHC 29.3* 30.9  RDW 16.4* 16.3*  PLT 454* 435*   BNPNo results for input(s): "BNP", "PROBNP" in  the last 168 hours.  DDimer No results for input(s): "DDIMER" in the last 168 hours.   Radiology  ECHOCARDIOGRAM COMPLETE  Result Date: 10/22/2022    ECHOCARDIOGRAM REPORT   Patient Name:   [name] Date of Exam: 10/22/2022 Medical Rec #:  [id number]       Height:       67.0 in Accession #:    [id number]      Weight:       161.8 lb Date of Birth:  1970/10/04      BSA:          1.848 m Patient Age:    51 years        BP:           197/86 mmHg Patient Gender: M               HR:           86 bpm. Exam Location:  Inpatient Procedure: 2D Echo, Cardiac Doppler and Color Doppler Indications:    Bacteremia  History:        Patient has prior history of Echocardiogram examinations, most                 recent 05/24/2022. CHF, Arrythmias:LBBB, Signs/Symptoms:Shortness                 of Breath; Risk Factors:Hypertension, Diabetes and Dyslipidemia.                 ESRD.  Sonographer:    Lucendia Herrlich Referring Phys: [id number] CORNELIUS N VAN DAM  Sonographer Comments: Image acquisition challenging due to uncooperative patient. IMPRESSIONS  1. There is severe concentric LVH and echogenic myocardium. Consider evalaution for infiltrative CM such as cardiac amyloid. . Left ventricular ejection fraction, by estimation, is 20 to 25%. The left ventricle has severely decreased function. The left ventricle demonstrates global hypokinesis. There is severe concentric left ventricular hypertrophy. Left ventricular diastolic function could not be evaluated.  2. Right ventricular systolic function is severely reduced. The right ventricular size is normal. There is severely elevated pulmonary artery systolic pressure. The estimated right ventricular systolic pressure is 66.6 mmHg.  3. Left atrial  size was mildly dilated.  4. Right atrial size was mild to moderately dilated.  5. A small pericardial effusion is present. The pericardial effusion is posterior to the left ventricle. There is no evidence of cardiac tamponade.  6. The mitral  valve is normal in structure. No evidence of mitral valve regurgitation. No evidence of mitral stenosis.  7. Tricuspid valve regurgitation is moderate.  8. The aortic valve is normal in structure. Aortic valve regurgitation is trivial. No aortic stenosis is present.  9. Patient uncooperative. Only parasternal views available. No apical or subcostal windows. FINDINGS  Left Ventricle: There is severe concentric LVH and echogenic myocardium. Consider evalaution for infiltrative CM such as cardiac amyloid. Left ventricular ejection fraction, by estimation, is 20 to 25%. The left ventricle has severely decreased function. The left ventricle demonstrates global hypokinesis. The left ventricular internal cavity size was normal in size. There is severe concentric left ventricular hypertrophy. Left ventricular diastolic function could not be evaluated. Right Ventricle: The right ventricular size is normal. No increase in right ventricular wall thickness. Right ventricular systolic function is severely reduced. There is severely elevated pulmonary artery systolic pressure. The tricuspid regurgitant velocity is 3.59 m/s, and with an assumed right atrial pressure of 15 mmHg, the estimated right ventricular systolic pressure is 66.6 mmHg. Left Atrium: Left atrial size was mildly dilated. Right Atrium: Right atrial size was mild to moderately dilated. Pericardium: A small pericardial effusion is present. The pericardial effusion is posterior to the left ventricle. There is no evidence of cardiac tamponade. Mitral Valve: The mitral valve is normal in structure. No evidence of mitral valve regurgitation. No evidence of mitral valve stenosis. Tricuspid Valve: The tricuspid valve is normal in structure. Tricuspid valve regurgitation is moderate . No evidence of tricuspid stenosis. Aortic Valve: The aortic valve is normal in structure. Aortic valve regurgitation is trivial. No aortic stenosis is present. Pulmonic Valve: The pulmonic  valve was normal in structure. Pulmonic valve regurgitation is trivial. No evidence of pulmonic stenosis. Aorta: The aortic root is normal in size and structure. Venous: The inferior vena cava was not well visualized. IAS/Shunts: No atrial level shunt detected by color flow Doppler.  LEFT VENTRICLE PLAX 2D LVIDd:         4.10 cm LVIDs:         3.70 cm LV PW:         1.70 cm LV IVS:        1.40 cm LVOT diam:     1.90 cm LVOT Area:     2.84 cm  LEFT ATRIUM         Index LA diam:    3.90 cm 2.11 cm/m   AORTA Ao Asc diam: 3.10 cm TRICUSPID VALVE TR Peak grad:   51.6 mmHg TR Vmax:        359.00 cm/s  SHUNTS Systemic Diam: 1.90 cm Arvilla Meres MD Electronically signed by Arvilla Meres MD Signature Date/Time: 10/22/2022/10:25:20 AM    Final     Cardiac Studies  TTE 10/22/2022  1. There is severe concentric LVH and echogenic myocardium. Consider  evalaution for infiltrative CM such as cardiac amyloid. . Left ventricular  ejection fraction, by estimation, is 20 to 25%. The left ventricle has  severely decreased function. The left  ventricle demonstrates global hypokinesis. There is severe concentric left  ventricular hypertrophy. Left ventricular diastolic function could not be  evaluated.   2. Right ventricular systolic function is severely reduced. The right  ventricular size is  normal. There is severely elevated pulmonary artery  systolic pressure. The estimated right ventricular systolic pressure is  66.6 mmHg.   3. Left atrial size was mildly dilated.   4. Right atrial size was mild to moderately dilated.   5. A small pericardial effusion is present. The pericardial effusion is  posterior to the left ventricle. There is no evidence of cardiac  tamponade.   6. The mitral valve is normal in structure. No evidence of mitral valve  regurgitation. No evidence of mitral stenosis.   7. Tricuspid valve regurgitation is moderate.   8. The aortic valve is normal in structure. Aortic valve regurgitation  is  trivial. No aortic stenosis is present.   9. Patient uncooperative. Only parasternal views available. No apical or  subcostal windows.   Patient Profile  52 year old male with history of ESRD on hemodialysis, chronic systolic heart failure, anemia, diabetes, hypertension admitted on 10/20/2022 for fall. He was admitted as he has not completed antibiotics for MRSA bacteremia. Cardiology is consulted for worsening LV dysfunction.   Assessment & Plan   # Acute systolic heart failure, EF 20-25% # Severe LVH, suspect hypertensive heart disease -Longstanding history of a nonischemic cardiomyopathy.  Coronary CTA in 2020 with no obstructive disease and was quite normal. -On hemodialysis.  Also with hypertension.  Loosely compliant with medications. -EKG shows changes consistent with LVH. -EF is reduced with severe LVH. -Overall suspect this is hypertensive heart disease.  Would recommend medical management.  Could consider outpatient PYP scan to exclude amyloidosis but I think we have a readily available explanation. -Increase carvedilol to 25 mg twice daily.  Continue Imdur 60 mg daily.  Continue hydralazine 100 mg 3 times daily. -Not a candidate for ACE/ARB/ARNI/MRA given ESRD. -Still remains volume up.  Needs more aggressive volume removal.  Would request this from nephrology.  # MRSA bacteremia -Transesophageal echo requested by ID.  We will facilitate this.  [facility] will sign off.   Medication Recommendations: As above Other recommendations (labs, testing, etc): None.  Follow up as an outpatient: We will arrange outpatient follow-up in 6 to 8 weeks with Dr. Gery Pray  For questions or updates, please contact [facility] Please consult [email or web address] for contact info under        Signed, Gerri Spore T. Flora Lipps, MD, [facility] [place]  [facility]  10/23/2022 9:00 AM

## 2022-10-23 NOTE — Progress Notes (Signed)
Patient ID: ABIEL ODEH, male   DOB: 1970-10-12, 52 y.o.   MRN: [id number] S: No events overnight. O:BP (!) 179/91 (BP Location: Left Arm)   Pulse 89   Temp 98.3 F (36.8 C) (Oral)   Resp 17   Ht 5\' 7"  (1.702 m)   Wt 74.1 kg   SpO2 95%   BMI 25.59 kg/m   Intake/Output Summary (Last 24 hours) at 10/23/2022 1357 Last data filed at 10/23/2022 0900 Gross per 24 hour  Intake 120 ml  Output --  Net 120 ml   Intake/Output: I/O last 3 completed shifts: In: 240 [P.O.:240] Out: 0   Intake/Output this shift:  Total I/O In: 120 [P.O.:120] Out: -  Weight change: -2.9 kg Gen: somnolent, NAD CVS: RRR Resp: CTA Abd: +BS, soft, NT/ND Ext: no edema, right AVG +T/B  Recent Labs  Lab 10/20/22 1708 10/21/22 0549  NA 136 138  K 5.0 4.7  CL 97* 102  CO2 21* 22  GLUCOSE 151* 210*  BUN 60* 65*  CREATININE 9.49* 10.26*  ALBUMIN  --  2.7*  CALCIUM 8.1* 7.7*  PHOS  --  6.2*  AST  --  22  ALT  --  27   Liver Function Tests: Recent Labs  Lab 10/21/22 0549  AST 22  ALT 27  ALKPHOS 223*  BILITOT 0.5  PROT 7.0  ALBUMIN 2.7*   No results for input(s): "LIPASE", "AMYLASE" in the last 168 hours. No results for input(s): "AMMONIA" in the last 168 hours. CBC: Recent Labs  Lab 10/20/22 1708 10/21/22 0549  WBC 8.5 7.9  NEUTROABS 6.1 5.6  HGB 8.4* 7.9*  HCT 28.7* 25.6*  MCV 90.8 88.3  PLT 454* 435*   Cardiac Enzymes: No results for input(s): "CKTOTAL", "CKMB", "CKMBINDEX", "TROPONINI" in the last 168 hours. CBG: Recent Labs  Lab 10/22/22 0803 10/22/22 1236 10/22/22 1710 10/22/22 1939 10/23/22 0748  GLUCAP 136* 133* 168* 140* 104*    Iron Studies:  Recent Labs    10/21/22 0549  IRON 37*  TIBC 297  FERRITIN 371*   Studies/Results: ECHOCARDIOGRAM COMPLETE  Result Date: 10/22/2022    ECHOCARDIOGRAM REPORT   Patient Name:   DELFINO SEMO Date of Exam: 10/22/2022 Medical Rec #:  [id number]       Height:       67.0 in Accession #:    [id number]      Weight:        161.8 lb Date of Birth:  1970-10-19      BSA:          1.848 m Patient Age:    51 years        BP:           197/86 mmHg Patient Gender: M               HR:           86 bpm. Exam Location:  Inpatient Procedure: 2D Echo, Cardiac Doppler and Color Doppler Indications:    Bacteremia  History:        Patient has prior history of Echocardiogram examinations, most                 recent 05/24/2022. CHF, Arrythmias:LBBB, Signs/Symptoms:Shortness                 of Breath; Risk Factors:Hypertension, Diabetes and Dyslipidemia.                 ESRD.  Sonographer:  Lucendia Herrlich Referring Phys: [id number] CORNELIUS N VAN DAM  Sonographer Comments: Image acquisition challenging due to uncooperative patient. IMPRESSIONS  1. There is severe concentric LVH and echogenic myocardium. Consider evalaution for infiltrative CM such as cardiac amyloid. . Left ventricular ejection fraction, by estimation, is 20 to 25%. The left ventricle has severely decreased function. The left ventricle demonstrates global hypokinesis. There is severe concentric left ventricular hypertrophy. Left ventricular diastolic function could not be evaluated.  2. Right ventricular systolic function is severely reduced. The right ventricular size is normal. There is severely elevated pulmonary artery systolic pressure. The estimated right ventricular systolic pressure is 66.6 mmHg.  3. Left atrial size was mildly dilated.  4. Right atrial size was mild to moderately dilated.  5. A small pericardial effusion is present. The pericardial effusion is posterior to the left ventricle. There is no evidence of cardiac tamponade.  6. The mitral valve is normal in structure. No evidence of mitral valve regurgitation. No evidence of mitral stenosis.  7. Tricuspid valve regurgitation is moderate.  8. The aortic valve is normal in structure. Aortic valve regurgitation is trivial. No aortic stenosis is present.  9. Patient uncooperative. Only parasternal views available. No  apical or subcostal windows. FINDINGS  Left Ventricle: There is severe concentric LVH and echogenic myocardium. Consider evalaution for infiltrative CM such as cardiac amyloid. Left ventricular ejection fraction, by estimation, is 20 to 25%. The left ventricle has severely decreased function. The left ventricle demonstrates global hypokinesis. The left ventricular internal cavity size was normal in size. There is severe concentric left ventricular hypertrophy. Left ventricular diastolic function could not be evaluated. Right Ventricle: The right ventricular size is normal. No increase in right ventricular wall thickness. Right ventricular systolic function is severely reduced. There is severely elevated pulmonary artery systolic pressure. The tricuspid regurgitant velocity is 3.59 m/s, and with an assumed right atrial pressure of 15 mmHg, the estimated right ventricular systolic pressure is 66.6 mmHg. Left Atrium: Left atrial size was mildly dilated. Right Atrium: Right atrial size was mild to moderately dilated. Pericardium: A small pericardial effusion is present. The pericardial effusion is posterior to the left ventricle. There is no evidence of cardiac tamponade. Mitral Valve: The mitral valve is normal in structure. No evidence of mitral valve regurgitation. No evidence of mitral valve stenosis. Tricuspid Valve: The tricuspid valve is normal in structure. Tricuspid valve regurgitation is moderate . No evidence of tricuspid stenosis. Aortic Valve: The aortic valve is normal in structure. Aortic valve regurgitation is trivial. No aortic stenosis is present. Pulmonic Valve: The pulmonic valve was normal in structure. Pulmonic valve regurgitation is trivial. No evidence of pulmonic stenosis. Aorta: The aortic root is normal in size and structure. Venous: The inferior vena cava was not well visualized. IAS/Shunts: No atrial level shunt detected by color flow Doppler.  LEFT VENTRICLE PLAX 2D LVIDd:         4.10 cm  LVIDs:         3.70 cm LV PW:         1.70 cm LV IVS:        1.40 cm LVOT diam:     1.90 cm LVOT Area:     2.84 cm  LEFT ATRIUM         Index LA diam:    3.90 cm 2.11 cm/m   AORTA Ao Asc diam: 3.10 cm TRICUSPID VALVE TR Peak grad:   51.6 mmHg TR Vmax:  359.00 cm/s  SHUNTS Systemic Diam: 1.90 cm Arvilla Meres MD Electronically signed by Arvilla Meres MD Signature Date/Time: 10/22/2022/10:25:20 AM    Final     calcitRIOL  0.25 mcg Oral Q T,Th,Sat-1800   calcium acetate  2,001 mg Oral TID WC   carvedilol  25 mg Oral BID WC   Chlorhexidine Gluconate Cloth  6 each Topical Q0600   heparin injection (subcutaneous)  5,000 Units Subcutaneous Q8H   hydrALAZINE  100 mg Oral Q8H   insulin aspart  0-6 Units Subcutaneous TID WC   insulin glargine-yfgn  7 Units Subcutaneous QHS   isosorbide mononitrate  60 mg Oral Daily   pantoprazole  40 mg Oral Daily    BMET    Component Value Date/Time   NA 138 10/21/2022 0549   NA 138 05/09/2020 1455   K 4.7 10/21/2022 0549   CL 102 10/21/2022 0549   CO2 22 10/21/2022 0549   GLUCOSE 210 (H) 10/21/2022 0549   BUN 65 (H) 10/21/2022 0549   BUN 26 (H) 05/09/2020 1455   CREATININE 10.26 (H) 10/21/2022 0549   CREATININE 0.93 09/06/2014 1657   CALCIUM 7.7 (L) 10/21/2022 0549   CALCIUM 7.6 (L) 05/12/2021 0923   GFRNONAA 6 (L) 10/21/2022 0549   GFRNONAA >89 09/06/2014 1657   GFRAA 37 (L) 05/09/2020 1455   GFRAA >89 09/06/2014 1657   CBC    Component Value Date/Time   WBC 7.9 10/21/2022 0549   RBC 2.90 (L) 10/21/2022 0549   HGB 7.9 (L) 10/21/2022 0549   HGB 13.2 05/09/2020 1455   HCT 25.6 (L) 10/21/2022 0549   HCT 41.5 05/09/2020 1455   PLT 435 (H) 10/21/2022 0549   PLT 418 05/09/2020 1455   MCV 88.3 10/21/2022 0549   MCV 84 05/09/2020 1455   MCH 27.2 10/21/2022 0549   MCHC 30.9 10/21/2022 0549   RDW 16.3 (H) 10/21/2022 0549   RDW 13.1 05/09/2020 1455   LYMPHSABS 1.1 10/21/2022 0549   LYMPHSABS 1.6 05/09/2020 1455   MONOABS 0.9 10/21/2022  0549   EOSABS 0.3 10/21/2022 0549   EOSABS 0.1 05/09/2020 1455   BASOSABS 0.1 10/21/2022 0549   BASOSABS 0.0 05/09/2020 1455    OP HD: [facility] TTS  4h  400/ 800   61.5kg  2/2.5 bath  RFA AVG   - Heparin 2000 units IV TIW - last HD in [place] recorded on 09/06/22, post wt 74.3kg (dry wt was 61.5 back then too) - rocaltrol 0.25 mcg po tiw - mircera 100 mcg IV q 2 wks, last 09/06/22       Assessment/ Plan: MRSA bacteremia - at OSH in [place] in May 2024, pt was rx'd w/ IV vanc but left after his course could be completed. Likely his last dose was about 4-5 days ago. Repeat blood cx's have already been sent here. Pharmacy has been consulted to resume IV vanc here. Per pmd.  ID following.  SP declot R forearm AVG - possibly done no 5/03, done out of town also. Good bruit here today.  Complicated by bacteremia.  VVS did evaluate AVG and does not feel this is the source of infection as he has an abscess on his left arm and nasal septum.   ESRD - on HD TTS. Plan for HD tomorrow to continue on his normal schedule. Still has chair at [facility] T,Th,S.  HTN/ volume - volume overload, which is chronic for him. He usually runs 6-10 kg over his dry wt. Max UF w/ HD 10/21/2022. Net  UF 3.8 liters. Still very much over OP EDW. Continue to lower volume.  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 Rec'd Aranesp 150 MCG 10/21/2022.  MBD ckd - Ca in range. Phos and alb ordered for am tomorrow. Resume rocaltrol po tiw. Get pth.  Homelessness - social issues. Interested in SNF placemetn  Irena Cords, MD [facility]

## 2022-10-23 NOTE — Progress Notes (Signed)
Heart Failure Navigator Progress Note  Assessed for Heart & Vascular TOC clinic readiness.  Patient does not meet criteria due to ESRD on hemodialysis.   Navigator will sign off at this time.    Nayeli Calvert, BSN, RN Heart Failure Nurse Navigator Secure Chat Only   

## 2022-10-23 NOTE — Progress Notes (Signed)
OT Cancellation Note  Patient Details Name: [name] MRN: [id number] DOB: 06-20-1970   Cancelled Treatment:    Reason Eval/Treat Not Completed: Other (comment) (OT attempted to see pt at 10:49 am this day. At that time, pt reported he did not sleep well and requested OT return after lunch. This OT agreed and attempted to see pt again at 14:13. Pt initialy stated he was agreeable to participation but with pt quickly refusing to answer orientation questions, refusing bed mobility/sitting EOB, and refusing participation from bed level. OT educated pt in importance of participation in acute skilled OT with pt reporting understanding but continuing to refuse participation. Pt kept eyes closed throughout conversation. OT to attempt to see pt later this day as schedule allows.)  Azyriah Nevins "Ronaldo Miyamoto" M., OTR/L, MA Acute Rehab [phone]    Lendon Colonel 10/23/2022, 2:28 PM

## 2022-10-23 NOTE — Progress Notes (Signed)
Pharmacy Antibiotic Note  [name] is a 52 y.o. male admitted on 10/20/2022 with bacteremia.  Pharmacy has been consulted for vancomycin dosing. He has a past medical history for HTN, HF, T2DM, ESRD (HD TTS), history of dialysis noncompliance, and history of strep bacteremia (05/2022). On 5/19, He was previously being treated for staph bacteremia in [place] with the suspected source being cellulitis on left arm with abscess. He also had a concern for AVG infection or suspected thrombus of AVG.   Bcx speciated MRSA with oxacillin R detected. They planned for for 4-6 weeks of antibiotics and for the patient to be admitted to [facility], but was not approved due to out-of-state insurance. Patient currently moved from [place] back to [place] and last received hemodialysis Wednesday, 5/29, and vancomycin for current staph bacteremia. VR 14, WBC 8.5, afebrile, blood cultures pending. No emergent dialysis today. Patient will be receiving an extra session of HD on Monday to return to a TTS schedule. He was previously getting vancomycin 500 mg IV after HD TTS with Vanc trough at 13  Pt is currently on TTS HD schedule. Plan for TEE Frid or next week. Repeat culture neg to date.   Plan: Vancomycin 750 mg TTS Monitor blood cultures, WBC, fever curve, signs of clinical improvement  Determine length of therapy   Height: 5\' 7"  (170.2 cm) Weight: 74.1 kg (163 lb 5.8 oz) IBW/kg (Calculated) : 66.1  Temp (24hrs), Avg:98.2 F (36.8 C), Min:98 F (36.7 C), Max:98.3 F (36.8 C)  Recent Labs  Lab 10/20/22 1708 10/21/22 0549  WBC 8.5 7.9  CREATININE 9.49* 10.26*  LATICACIDVEN 1.4  --   VANCORANDOM 14  --     Estimated Creatinine Clearance: 8.1 mL/min (A) (by C-G formula based on SCr of 10.26 mg/dL (H)).    Allergies  Allergen Reactions   Sulfa Antibiotics Rash    Severe rash.  Severe rash.     Severe rash.   Other Other (See Comments)    Patient states he is allergic to the TB test -  unknown reaction during childhood    Tuberculin Ppd     Other Reaction(s): Other (See Comments)   Bactrim [Sulfamethoxazole-Trimethoprim] Rash   Cephalexin Other (See Comments) and Rash    Patient with severe drug reaction including exfoliating skin rash and hypotension after being prescribed both cephalexin and sulfamethoxazole-trimethoprim simultaneously. Favor SMX as much more likely culprit as patient had tolerated other cephalosporins in the past, but cannot say with complete certainty that this was not related to cephalexin.  Other Reaction(s): Other, Other (See Comments)  Patient with severe drug reaction including exfoliating skin rash and hypotension after being prescribed both cephalexin and sulfamethoxazole-trimethoprim simultaneously. Favor SMX as much more likely culprit as patient had tolerated other cephalosporins in the past, but cannot say with complete certainty that this was not related to cephalexin.   "Patient with severe drug reaction including exfoliating skin rash and hypotension after being prescribed both cephalexin and sulfamethoxazole-trimethoprim simultaneously. Favor SMX as much more likely culprit as patient had tolerated other cephalosporins in the past, but cannot say with complete certainty that this was not related to cephalexin." As noted in [facility]   "Patient with severe drug reaction including exfoliating skin rash and hypotension after being prescribed both cephalexin and sulfamethoxazole-trimethoprim simultaneously. Favor SMX as much more likely culprit as patient had tolerated other cephalosporins in the past, but cannot say with complete certainty that this was not related to cephalexin." As noted in [facility]   "Patient with severe drug reaction including exfoliating skin rash and hypotension after being prescribed both cephalexin and sulfamethoxazole-trimethoprim simultaneously. Favor SMX as much more likely culprit as patient had tolerated other  cephalosporins in the past, but cannot say with complete certainty that this was not related to cephalexin." As noted in [facility]     Patient with severe drug reaction including exfoliating skin rash and hypotension after being prescribed both cephalexin and sulfamethoxazole-trimethoprim simultaneously. Favor SMX as much more likely culprit as patient had tolerated other cephalosporins in the past, but cannot say with complete certainty that this was not related to cephalexin.  "Patient with severe drug reaction including exfoliating skin rash and hypotension after being prescribed both cephalexin and sulfamethoxazole-trimethoprim simultaneously. Favor SMX as much more likely culprit as patient had tolerated other cephalosporins in the past, but cannot say with complete certainty that this was not related to cephalexin." As noted in [facility]  "Patient with severe drug reaction including exfoliating skin rash and hypotension after being prescribed both cephalexin and sulfamethoxazole-trimethoprim simultaneously. Favor SMX as much more likely culprit as patient had tolerated other cephalosporins in the past, but cannot say with complete certainty that this was not related to cephalexin." As noted in [facility]     Patient with severe drug reaction inc... (TRUNCATED)    Antimicrobials this admission: Vancomycin 5/23-5/24 >>  (based on note from care everywhere on 5/19)  Microbiology results: 6/2 BCx: ngtd  [name], PharmD, BCIDP, AAHIVP, CPP Infectious Disease Pharmacist 10/23/2022 12:56 PM

## 2022-10-23 NOTE — Progress Notes (Signed)
TEE requested by ID. First available slot is Monday. However, may have cancellations tomorrow and Friday. Please make NPO at [place].   I will ask Dr. Flora Lipps to consent and I will place orders.

## 2022-10-23 NOTE — Progress Notes (Addendum)
PROGRESS NOTE        PATIENT DETAILS Name: [name] Age: 52 y.o. Sex: male Date of Birth: 1970-09-02 Admit Date: 10/20/2022 Admitting Physician Starleen Arms, MD [id number]  Brief Summary: Patient is a 52 y.o.  male with tree of ESRD on HD TTS, HTN, DM-2-who was recently admitted to numerous hospitals in [place] in the setting of MRSA bacteremia/AV graft infection-he unfortunately left AMA from the hospital [place] before he finished completing treatment-presented to the ED following a mechanical fall.    Significant events: 6/2>> admit to [facility]  Significant studies: 6/2>> x-ray right elbow: No fracture. 6/2>> x-ray right knee: No fracture 6/2>> CXR: No active disease. 6/4>> echo: EF 20-25%, consider evaluation for infiltrative cardiomyopathy.  Significant microbiology data: 6/2>> blood culture: No growth  Procedures: None  Consults: ID Nephrology  Subjective: Lying comfortably in bed-denies any chest pain or shortness of breath.  Objective: Vitals: Blood pressure (!) 179/91, pulse 89, temperature 98.3 F (36.8 C), temperature source Oral, resp. rate 17, height 5\' 7"  (1.702 m), weight 74.1 kg, SpO2 95 %.   Exam: Gen Exam:Alert awake-not in any distress HEENT:atraumatic, normocephalic Chest: B/L clear to auscultation anteriorly CVS:S1S2 regular Abdomen:soft non tender, non distended Extremities:no edema Neurology: Non focal Skin: no rash  Pertinent Labs/Radiology:    Latest Ref Rng & Units 10/21/2022    5:49 AM 10/20/2022    5:08 PM 09/17/2022    9:13 PM  CBC  WBC 4.0 - 10.5 K/uL 7.9  8.5  9.4   Hemoglobin 13.0 - 17.0 g/dL 7.9  8.4  9.8   Hematocrit 39.0 - 52.0 % 25.6  28.7  31.4   Platelets 150 - 400 K/uL 435  454  535     Lab Results  Component Value Date   NA 138 10/21/2022   K 4.7 10/21/2022   CL 102 10/21/2022   CO2 22 10/21/2022      Assessment/Plan: MRSA bacteremia Infected AV graft Afebrile-no  leukocytosis Continue IV vancomycin Cardiology consulted for TEE Discussed with ID-and then with nephrologist-Dr Coladonato-nephrology will discuss with vascular surgery regarding AV graft infected tissue.  ESRD on HD TTS Nephrology following and directing care See above regarding concern for infected AV graft.  Acute on chronic systolic/diastolic heart failure Remains volume overloaded Volume removal with HD Per cardiology-no concern for infiltrative cardiomyopathy Continue Coreg/hydralazine/Imdur Per cardiology-if compliant with medications-repeat echocardiogram in several months to recheck EF  HTN Previously noncompliant-now with significant cardiac issues-extensive counseling-he seems to be very motivated. BP fluctuating but overall better-continue Imdur/hydralazine/Coreg  DM-2 (A1c 7.7 on 4/21) CBG stable Continue Semglee 7 units daily and SSI.  Recent Labs    10/22/22 1710 10/22/22 1939 10/23/22 0748  GLUCAP 168* 140* 104*    Normocytic anemia Secondary to ESRD Aranesp/iron per nephrology service  GERD PPI  Mechanical fall  Recurrent issue PT/OT eval-SNF recommended  BMI: Estimated body mass index is 25.59 kg/m as calculated from the following:   Height as of this encounter: 5\' 7"  (1.702 m).   Weight as of this encounter: 74.1 kg.   Code status:   Code Status: Full Code   DVT Prophylaxis: heparin injection 5,000 Units Start: 10/21/22 1400 SCDs Start: 10/20/22 1927    Family Communication: None at bedside   Disposition Plan: Status is: Inpatient Remains inpatient appropriate because: Severity of illness   Planned  Discharge Destination:Skilled nursing facility   Diet: Diet Order             Diet NPO time specified  Diet effective midnight           Diet renal with fluid restriction Fluid restriction: 1200 mL Fluid; Room service appropriate? No; Fluid consistency: Thin  Diet effective now                     Antimicrobial  agents: Anti-infectives (From admission, onward)    Start     Dose/Rate Route Frequency Ordered Stop   10/22/22 1200  vancomycin (VANCOREADY) IVPB 750 mg/150 mL        750 mg 150 mL/hr over 60 Minutes Intravenous Every T-Th-Sa (Hemodialysis) 10/20/22 2111     10/21/22 1800  vancomycin (VANCOREADY) IVPB 750 mg/150 mL        750 mg 150 mL/hr over 60 Minutes Intravenous  Once 10/21/22 1327 10/21/22 1712   10/21/22 1200  vancomycin (VANCOREADY) IVPB 750 mg/150 mL  Status:  Discontinued        750 mg 150 mL/hr over 60 Minutes Intravenous To Hemodialysis 10/20/22 2111 10/21/22 1327        MEDICATIONS: Scheduled Meds:  calcitRIOL  0.25 mcg Oral Q T,Th,Sat-1800   calcium acetate  2,001 mg Oral TID WC   carvedilol  25 mg Oral BID WC   Chlorhexidine Gluconate Cloth  6 each Topical Q0600   heparin injection (subcutaneous)  5,000 Units Subcutaneous Q8H   hydrALAZINE  100 mg Oral Q8H   insulin aspart  0-6 Units Subcutaneous TID WC   insulin glargine-yfgn  7 Units Subcutaneous QHS   isosorbide mononitrate  60 mg Oral Daily   pantoprazole  40 mg Oral Daily   Continuous Infusions:  vancomycin     PRN Meds:.acetaminophen **OR** acetaminophen, hydrALAZINE, melatonin, ondansetron (ZOFRAN) IV, oxyCODONE-acetaminophen   I have personally reviewed following labs and imaging studies  LABORATORY DATA: CBC: Recent Labs  Lab 10/20/22 1708 10/21/22 0549  WBC 8.5 7.9  NEUTROABS 6.1 5.6  HGB 8.4* 7.9*  HCT 28.7* 25.6*  MCV 90.8 88.3  PLT 454* 435*    Basic Metabolic Panel: Recent Labs  Lab 10/20/22 1708 10/21/22 0549  NA 136 138  K 5.0 4.7  CL 97* 102  CO2 21* 22  GLUCOSE 151* 210*  BUN 60* 65*  CREATININE 9.49* 10.26*  CALCIUM 8.1* 7.7*  MG 2.6* 2.5*  PHOS  --  6.2*    GFR: Estimated Creatinine Clearance: 8.1 mL/min (A) (by C-G formula based on SCr of 10.26 mg/dL (H)).  Liver Function Tests: Recent Labs  Lab 10/21/22 0549  AST 22  ALT 27  ALKPHOS 223*  BILITOT 0.5   PROT 7.0  ALBUMIN 2.7*   No results for input(s): "LIPASE", "AMYLASE" in the last 168 hours. No results for input(s): "AMMONIA" in the last 168 hours.  Coagulation Profile: Recent Labs  Lab 10/20/22 2148  INR 1.3*    Cardiac Enzymes: No results for input(s): "CKTOTAL", "CKMB", "CKMBINDEX", "TROPONINI" in the last 168 hours.  BNP (last 3 results) No results for input(s): "PROBNP" in the last 8760 hours.  Lipid Profile: No results for input(s): "CHOL", "HDL", "LDLCALC", "TRIG", "CHOLHDL", "LDLDIRECT" in the last 72 hours.  Thyroid Function Tests: No results for input(s): "TSH", "T4TOTAL", "FREET4", "T3FREE", "THYROIDAB" in the last 72 hours.  Anemia Panel: Recent Labs    10/21/22 0549  FERRITIN 371*  TIBC 297  IRON 37*  Urine analysis:    Component Value Date/Time   COLORURINE YELLOW 02/19/2021 0045   APPEARANCEUR HAZY (A) 02/19/2021 0045   APPEARANCEUR Clear 05/09/2020 1509   LABSPEC 1.013 02/19/2021 0045   PHURINE 5.0 02/19/2021 0045   GLUCOSEU >=500 (A) 02/19/2021 0045   HGBUR MODERATE (A) 02/19/2021 0045   BILIRUBINUR NEGATIVE 02/19/2021 0045   BILIRUBINUR Negative 05/09/2020 1509   KETONESUR NEGATIVE 02/19/2021 0045   PROTEINUR >=300 (A) 02/19/2021 0045   UROBILINOGEN 0.2 02/04/2020 1612   UROBILINOGEN 0.2 02/16/2014 0045   NITRITE NEGATIVE 02/19/2021 0045   LEUKOCYTESUR NEGATIVE 02/19/2021 0045    Sepsis Labs: Lactic Acid, Venous    Component Value Date/Time   LATICACIDVEN 1.4 10/20/2022 1708    MICROBIOLOGY: Recent Results (from the past 240 hour(s))  Blood culture (routine x 2)     Status: None (Preliminary result)   Collection Time: 10/20/22  6:43 PM   Specimen: BLOOD LEFT ARM  Result Value Ref Range Status   Specimen Description BLOOD LEFT ARM  Final   Special Requests   Final    BOTTLES DRAWN AEROBIC AND ANAEROBIC Blood Culture results may not be optimal due to an inadequate volume of blood received in culture bottles   Culture    Final    NO GROWTH 2 DAYS Performed at [facility], [address] [place], [place] [address]    Report Status PENDING  Incomplete  Blood culture (routine x 2)     Status: None (Preliminary result)   Collection Time: 10/20/22  9:47 PM   Specimen: BLOOD LEFT HAND  Result Value Ref Range Status   Specimen Description BLOOD LEFT HAND  Final   Special Requests   Final    BOTTLES DRAWN AEROBIC AND ANAEROBIC Blood Culture adequate volume   Culture   Final    NO GROWTH 2 DAYS Performed at [facility], [address], [place], [place] [address]    Report Status PENDING  Incomplete    RADIOLOGY STUDIES/RESULTS: ECHOCARDIOGRAM COMPLETE  Result Date: 10/22/2022    ECHOCARDIOGRAM REPORT   Patient Name:   [name] Date of Exam: 10/22/2022 Medical Rec #:  [id number]       Height:       67.0 in Accession #:    [id number]      Weight:       161.8 lb Date of Birth:  [birth year]      BSA:          1.848 m Patient Age:    51 years        BP:           197/86 mmHg Patient Gender: M               HR:           86 bpm. Exam Location:  Inpatient Procedure: 2D Echo, Cardiac Doppler and Color Doppler Indications:    Bacteremia  History:        Patient has prior history of Echocardiogram examinations, most                 recent 05/24/2022. CHF, Arrythmias:LBBB, Signs/Symptoms:Shortness                 of Breath; Risk Factors:Hypertension, Diabetes and Dyslipidemia.                 ESRD.  Sonographer:    Lucendia Herrlich Referring Phys: [id number] CORNELIUS N VAN DAM  Sonographer Comments:  Image acquisition challenging due to uncooperative patient. IMPRESSIONS  1. There is severe concentric LVH and echogenic myocardium. Consider evalaution for infiltrative CM such as cardiac amyloid. . Left ventricular ejection fraction, by estimation, is 20 to 25%. The left ventricle has severely decreased function. The left ventricle demonstrates global hypokinesis. There is severe concentric left ventricular hypertrophy.  Left ventricular diastolic function could not be evaluated.  2. Right ventricular systolic function is severely reduced. The right ventricular size is normal. There is severely elevated pulmonary artery systolic pressure. The estimated right ventricular systolic pressure is 66.6 mmHg.  3. Left atrial size was mildly dilated.  4. Right atrial size was mild to moderately dilated.  5. A small pericardial effusion is present. The pericardial effusion is posterior to the left ventricle. There is no evidence of cardiac tamponade.  6. The mitral valve is normal in structure. No evidence of mitral valve regurgitation. No evidence of mitral stenosis.  7. Tricuspid valve regurgitation is moderate.  8. The aortic valve is normal in structure. Aortic valve regurgitation is trivial. No aortic stenosis is present.  9. Patient uncooperative. Only parasternal views available. No apical or subcostal windows. FINDINGS  Left Ventricle: There is severe concentric LVH and echogenic myocardium. Consider evalaution for infiltrative CM such as cardiac amyloid. Left ventricular ejection fraction, by estimation, is 20 to 25%. The left ventricle has severely decreased function. The left ventricle demonstrates global hypokinesis. The left ventricular internal cavity size was normal in size. There is severe concentric left ventricular hypertrophy. Left ventricular diastolic function could not be evaluated. Right Ventricle: The right ventricular size is normal. No increase in right ventricular wall thickness. Right ventricular systolic function is severely reduced. There is severely elevated pulmonary artery systolic pressure. The tricuspid regurgitant velocity is 3.59 m/s, and with an assumed right atrial pressure of 15 mmHg, the estimated right ventricular systolic pressure is 66.6 mmHg. Left Atrium: Left atrial size was mildly dilated. Right Atrium: Right atrial size was mild to moderately dilated. Pericardium: A small pericardial effusion is  present. The pericardial effusion is posterior to the left ventricle. There is no evidence of cardiac tamponade. Mitral Valve: The mitral valve is normal in structure. No evidence of mitral valve regurgitation. No evidence of mitral valve stenosis. Tricuspid Valve: The tricuspid valve is normal in structure. Tricuspid valve regurgitation is moderate . No evidence of tricuspid stenosis. Aortic Valve: The aortic valve is normal in structure. Aortic valve regurgitation is trivial. No aortic stenosis is present. Pulmonic Valve: The pulmonic valve was normal in structure. Pulmonic valve regurgitation is trivial. No evidence of pulmonic stenosis. Aorta: The aortic root is normal in size and structure. Venous: The inferior vena cava was not well visualized. IAS/Shunts: No atrial level shunt detected by color flow Doppler.  LEFT VENTRICLE PLAX 2D LVIDd:         4.10 cm LVIDs:         3.70 cm LV PW:         1.70 cm LV IVS:        1.40 cm LVOT diam:     1.90 cm LVOT Area:     2.84 cm  LEFT ATRIUM         Index LA diam:    3.90 cm 2.11 cm/m   AORTA Ao Asc diam: 3.10 cm TRICUSPID VALVE TR Peak grad:   51.6 mmHg TR Vmax:        359.00 cm/s  SHUNTS Systemic Diam: 1.90 cm Arvilla Meres MD Electronically  signed by Arvilla Meres MD Signature Date/Time: 10/22/2022/10:25:20 AM    Final      LOS: 2 days   Jeoffrey Massed, MD  [facility]    To contact the attending provider between 7A-7P or the covering provider during after hours 7P-7A, please log into the web site [email or web address] and access using universal [place] password for that web site. If you do not have the password, please call the hospital operator.  10/23/2022, 11:16 AM

## 2022-10-23 NOTE — TOC Progression Note (Signed)
Transition of Care [facility]) - Progression Note    Patient Details  Name: [name] MRN: [id number] Date of Birth: June 10, 1970  [facility] CM/SW Contact  Mearl Latin, LCSW Phone Number: 10/23/2022, 4:40 PM  Clinical Narrative:    3pm-CSW attempted to meet with patient regarding SNF but patient would not wake up.  4:30pm-CSW attempted to call patient with no answer. Will attempt again tomorrow.    Expected Discharge Plan: Skilled Nursing Facility Barriers to Discharge: Continued Medical Work up, English as a second language teacher, SNF Pending bed offer, Homeless with medical needs  Expected Discharge Plan and Services In-house Referral: Clinical Social Work     Living arrangements for the past 2 months: Homeless                                       Social Determinants of Health (SDOH) Interventions SDOH Screenings   Food Insecurity: Food Insecurity Present (05/08/2022)  Housing: High Risk (10/21/2022)  Transportation Needs: Unmet Transportation Needs (10/21/2022)  Utilities: Not At Risk (10/21/2022)  Depression (PHQ2-9): Low Risk  (10/13/2019)  Financial Resource Strain: High Risk (05/08/2022)  Tobacco Use: Low Risk  (10/20/2022)    Readmission Risk Interventions    10/22/2022    2:17 PM 11/07/2021    4:21 PM 02/19/2021    3:52 PM  Readmission Risk Prevention Plan  Transportation Screening Complete Complete Complete  Medication Review Oceanographer) Complete Complete Complete  PCP or Specialist appointment within 3-5 days of discharge Complete Complete Complete  HRI or Home Care Consult Complete Complete Complete  SW Recovery Care/Counseling Consult Complete Complete Complete  Palliative Care Screening Not Applicable Not Applicable Not Applicable  Skilled Nursing Facility Complete Not Applicable Not Applicable

## 2022-10-23 NOTE — Progress Notes (Signed)
Stat upper extremity duplex dialysis access study attempted. Patient eating per transport and does not wish to come to department at this time. Will attempt again as schedule and patient availability permits.   Jean Rosenthal, RDMS, RVT

## 2022-10-23 NOTE — Consult Note (Signed)
Hospital Consult    Reason for Consult:  infected AVG Requesting Physician:  Dr. Jerral Ralph MRN #:  [id number]  History of Present Illness: This is a 52 y.o. male with pertinent past medical history of Type II DM, HTN, and ESRD on HD via a right forearm loop AV graft. He had this graft placed in October of 2022 by Dr. Edilia Bo. Apparently he was recently hospitalized in [place] for MRSA bacteremia associated with his AV graft. He was suppose to be on 4-6 weeks of IV ABX but left AMA. He has been back and forth between [place] and [place] over past several months undergoing several procedures on his graft during this time but do not have all of these records. Most recently had AV graft thrombectomy on 5/3 per notes. Patient reports that graft is overall working well. He does report abscess in left upper arm.  He denies any fever or chills, n/v. Vascular surgery has been consulted to evaluate the AV graft for possible infection.   Past Medical History:  Diagnosis Date   Anxiety 02/2019   Anxiety 10/2019   Diabetes mellitus type II, uncontrolled 07/17/2006   Elevated alkaline phosphatase level 01/2019   ESSENTIAL HYPERTENSION 07/17/2006   Homelessness 10/13/2021   HYPERLIPIDEMIA 11/24/2008   Insomnia 02/2019   Left bundle branch block 02/22/2019   Lesion of lip 10/2019   Pneumonia 10/22/2011   Suicidal ideations 02/2019   Vitamin D deficiency 01/2019    Past Surgical History:  Procedure Laterality Date   ARM SURGERY     AV FISTULA PLACEMENT Right 02/20/2021   Procedure: RIGHT ARTERIOVENOUS GRAFT CREATION;  Surgeon: Chuck Hint, MD;  Location: [facility] OR;  Service: Vascular;  Laterality: Right;   INSERTION OF DIALYSIS CATHETER Left 02/20/2021   Procedure: INSERTION OF DIALYSIS CATHETER USING PALINDROME CATHETER;  Surgeon: Chuck Hint, MD;  Location: [facility] OR;  Service: Vascular;  Laterality: Left;   IR AV DIALY SHUNT INTRO NEEDLE/INTRACATH INITIAL W/PTA/IMG RIGHT Right 11/06/2021   IR  FLUORO GUIDE CV LINE RIGHT  09/12/2022   IR REMOVAL TUN CV CATH W/O FL  07/27/2021   IR THROMBECTOMY AV FISTULA W/THROMBOLYSIS INC/SHUNT/IMG RIGHT  09/11/2022   IR US GUIDE VASC ACCESS RIGHT  11/06/2021   IR US GUIDE VASC ACCESS RIGHT  09/11/2022   IR US GUIDE VASC ACCESS RIGHT  09/12/2022   ULTRASOUND GUIDANCE FOR VASCULAR ACCESS  02/20/2021   Procedure: ULTRASOUND GUIDANCE FOR VASCULAR ACCESS;  Surgeon: Chuck Hint, MD;  Location: [facility] OR;  Service: Vascular;;    Allergies  Allergen Reactions   Sulfa Antibiotics Rash    Severe rash.  Severe rash.     Severe rash.   Other Other (See Comments)    Patient states he is allergic to the TB test - unknown reaction during childhood    Tuberculin Ppd     Other Reaction(s): Other (See Comments)   Bactrim [Sulfamethoxazole-Trimethoprim] Rash   Cephalexin Other (See Comments) and Rash    Patient with severe drug reaction including exfoliating skin rash and hypotension after being prescribed both cephalexin and sulfamethoxazole-trimethoprim simultaneously. Favor SMX as much more likely culprit as patient had tolerated other cephalosporins in the past, but cannot say with complete certainty that this was not related to cephalexin.  Other Reaction(s): Other, Other (See Comments)  Patient with severe drug reaction including exfoliating skin rash and hypotension after being prescribed both cephalexin and sulfamethoxazole-trimethoprim simultaneously. Favor SMX as much more likely culprit as patient had tolerated other cephalosporins  in the past, but cannot say with complete certainty that this was not related to cephalexin.   "Patient with severe drug reaction including exfoliating skin rash and hypotension after being prescribed both cephalexin and sulfamethoxazole-trimethoprim simultaneously. Favor SMX as much more likely culprit as patient had tolerated other cephalosporins in the past, but cannot say with complete certainty that this was not  related to cephalexin." As noted in [facility]   "Patient with severe drug reaction including exfoliating skin rash and hypotension after being prescribed both cephalexin and sulfamethoxazole-trimethoprim simultaneously. Favor SMX as much more likely culprit as patient had tolerated other cephalosporins in the past, but cannot say with complete certainty that this was not related to cephalexin." As noted in [facility]   "Patient with severe drug reaction including exfoliating skin rash and hypotension after being prescribed both cephalexin and sulfamethoxazole-trimethoprim simultaneously. Favor SMX as much more likely culprit as patient had tolerated other cephalosporins in the past, but cannot say with complete certainty that this was not related to cephalexin." As noted in [facility]     Patient with severe drug reaction including exfoliating skin rash and hypotension after being prescribed both cephalexin and sulfamethoxazole-trimethoprim simultaneously. Favor SMX as much more likely culprit as patient had tolerated other cephalosporins in the past, but cannot say with complete certainty that this was not related to cephalexin.  "Patient with severe drug reaction including exfoliating skin rash and hypotension after being prescribed both cephalexin and sulfamethoxazole-trimethoprim simultaneously. Favor SMX as much more likely culprit as patient had tolerated other cephalosporins in the past, but cannot say with complete certainty that this was not related to cephalexin." As noted in [facility]  "Patient with severe drug reaction including exfoliating skin rash and hypotension after being prescribed both cephalexin and sulfamethoxazole-trimethoprim simultaneously. Favor SMX as much more likely culprit as patient had tolerated other cephalosporins in the past, but cannot say with complete certainty that this was not related to cephalexin." As noted in [facility]     Patient with severe drug reaction  inc... (TRUNCATED)    Prior to Admission medications   Medication Sig Start Date End Date Taking? Authorizing Provider  acetaminophen (TYLENOL) 500 MG tablet Take 1 tablet (500 mg total) by mouth every 8 (eight) hours as needed for mild pain. 05/28/22   Leroy Sea, MD  amLODipine (NORVASC) 10 MG tablet Take 1 tablet (10 mg total) by mouth daily. 05/26/22   Leroy Sea, MD  ASPIRIN LOW DOSE 81 MG tablet Take 1 tablet (81 mg total) by mouth daily. 05/28/22   Leroy Sea, MD  benzonatate (TESSALON) 100 MG capsule Take 1 capsule (100 mg total) by mouth every 8 (eight) hours. 09/09/22   Sabas Sous, MD  calcium acetate (PHOSLO) 667 MG capsule Take 3 capsules (2,001 mg total) by mouth 3 (three) times daily. 05/28/22   Leroy Sea, MD  carvedilol (COREG) 6.25 MG tablet Take 1 tablet (6.25 mg total) by mouth 2 (two) times daily with a meal. 05/28/22   Leroy Sea, MD  Fingerstix Lancets MISC Use as directed up to 4 times daily. 05/28/22   Leroy Sea, MD  glucose blood test strip Use to check fasting blood sugar once daily. 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 Insulin dependent 11/29/20   Hollice Espy, MD  glucose blood test strip Use as directed up to 4 times daily 05/28/22   Leroy Sea, MD  insulin aspart (NOVOLOG) 100 UNIT/ML FlexPen Before each meal  3 times a day, 140-199 - 2 units, 200-250 - 4 units, 251-299 - 6 units,  300-349 - 8 units,  350 or above 10 units. 05/28/22   Leroy Sea, MD  insulin glargine (LANTUS SOLOSTAR) 100 UNIT/ML Solostar Pen Inject 35 Units into the skin daily.Not to exceed 100 units per day. 06/19/22     Insulin Pen Needle 32G X 4 MM MISC Use with insulin pens 05/28/22   Leroy Sea, MD  Insulin Syringe-Needle U-100 25G X 1" 1 ML MISC For 4 times a day insulin SQ, 1 month supply. Diagnosis E11.65 05/28/22   Leroy Sea, MD  isosorbide-hydrALAZINE (BIDIL) 20-37.5 MG tablet Take 1 tablet by mouth 3 (three) times daily. 07/20/22      oxyCODONE-acetaminophen (PERCOCET/ROXICET) 5-325 MG tablet Take 1 tablet by mouth every 12 (twelve) hours as needed for severe pain. 07/04/22   Derwood Kaplan, MD  PROTONIX 40 MG tablet Take 1 tablet (40 mg total) by mouth daily. 05/25/22   Leroy Sea, MD  sevelamer carbonate (RENVELA) 800 MG tablet Take 2 tablets (1,600 mg total) by mouth 3 (three) times daily with meals. May also take 1 tablet (800 mg total) 2 (two) times daily as needed with snacks. 05/28/22   Leroy Sea, MD  gabapentin (NEURONTIN) 300 MG capsule Take 1 capsule (300 mg total) by mouth 3 (three) times daily. Patient not taking: Reported on 07/27/2019 07/29/18 08/30/19  Kallie Locks, FNP  sildenafil (VIAGRA) 25 MG tablet Take 1 tablet (25 mg total) by mouth as needed for erectile dysfunction. for erectile dysfunction. Patient not taking: Reported on 08/11/2020 10/13/19 11/27/20  Kallie Locks, FNP    Social History   Socioeconomic History   Marital status: Legally Separated    Spouse name: Not on file   Number of children: Not on file   Years of education: Not on file   Highest education level: Not on file  Occupational History   Occupation: cook    Comment: lost job   Occupation: Company secretary    Comment: Updated June 2013   Occupation: Data processing manager    Comment: Began Jan 2014  Tobacco Use   Smoking status: Never   Smokeless tobacco: Never  Vaping Use   Vaping Use: Never used  Substance and Sexual Activity   Alcohol use: No    Alcohol/week: 0.0 standard drinks of alcohol   Drug use: No   Sexual activity: Yes    Partners: Female  Other Topics Concern   Not on file  Social History Narrative   Lives with his mother and brother.   Wife has had a foot amputation; is a type 1 DM.   Social Determinants of Health   Financial Resource Strain: High Risk (05/08/2022)   Overall Financial Resource Strain (CARDIA)    Difficulty of Paying Living Expenses: Very hard  Food Insecurity: Food Insecurity  Present (05/08/2022)   Hunger Vital Sign    Worried About Running Out of Food in the Last Year: Often true    Ran Out of Food in the Last Year: Often true  Transportation Needs: Unmet Transportation Needs (10/21/2022)   PRAPARE - Administrator, Civil Service (Medical): Yes    Lack of Transportation (Non-Medical): Yes  Physical Activity: Not on file  Stress: Not on file  Social Connections: Not on file  Intimate Partner Violence: Not At Risk (10/21/2022)   Humiliation, Afraid, Rape, and Kick questionnaire    Fear of Current or  Ex-Partner: No    Emotionally Abused: No    Physically Abused: No    Sexually Abused: No     Family History  Problem Relation Age of Onset   Hypertension Mother 27   Cancer Mother        bladder cancer   Alcohol abuse Father    Hypertension Brother     ROS: Otherwise negative unless mentioned in HPI  Physical Examination  Vitals:   10/23/22 0625 10/23/22 0746  BP: (!) 141/68 (!) 179/91  Pulse:  89  Resp:  17  Temp:  98.3 F (36.8 C)  SpO2:     Body mass index is 25.59 kg/m.  General:  WDWN in NAD Gait: Not observed HENT: WNL, normocephalic Pulmonary: normal non-labored breathing, without wheezing Cardiac: regular rate and rhythm Vascular Exam/Pulses: 2+ right radial pulse, 5/5 grip strength. Right forearm loop graft with good thrill. No swelling, no warmth, no erythema, no overlying skin changes concerning for infection  Extremities: without ischemic changes, without Gangrene , without cellulitis; without open wounds;  Musculoskeletal: no muscle wasting or atrophy  Neurologic: A&O X 3;  No focal weakness or paresthesias are detected; speech is fluent/normal Psychiatric:  The pt has Normal affect. CBC    Component Value Date/Time   WBC 7.9 10/21/2022 0549   RBC 2.90 (L) 10/21/2022 0549   HGB 7.9 (L) 10/21/2022 0549   HGB 13.2 05/09/2020 1455   HCT 25.6 (L) 10/21/2022 0549   HCT 41.5 05/09/2020 1455   PLT 435 (H) 10/21/2022  0549   PLT 418 05/09/2020 1455   MCV 88.3 10/21/2022 0549   MCV 84 05/09/2020 1455   MCH 27.2 10/21/2022 0549   MCHC 30.9 10/21/2022 0549   RDW 16.3 (H) 10/21/2022 0549   RDW 13.1 05/09/2020 1455   LYMPHSABS 1.1 10/21/2022 0549   LYMPHSABS 1.6 05/09/2020 1455   MONOABS 0.9 10/21/2022 0549   EOSABS 0.3 10/21/2022 0549   EOSABS 0.1 05/09/2020 1455   BASOSABS 0.1 10/21/2022 0549   BASOSABS 0.0 05/09/2020 1455    BMET    Component Value Date/Time   NA 138 10/21/2022 0549   NA 138 05/09/2020 1455   K 4.7 10/21/2022 0549   CL 102 10/21/2022 0549   CO2 22 10/21/2022 0549   GLUCOSE 210 (H) 10/21/2022 0549   BUN 65 (H) 10/21/2022 0549   BUN 26 (H) 05/09/2020 1455   CREATININE 10.26 (H) 10/21/2022 0549   CREATININE 0.93 09/06/2014 1657   CALCIUM 7.7 (L) 10/21/2022 0549   CALCIUM 7.6 (L) 05/12/2021 0923   GFRNONAA 6 (L) 10/21/2022 0549   GFRNONAA >89 09/06/2014 1657   GFRAA 37 (L) 05/09/2020 1455   GFRAA >89 09/06/2014 1657    COAGS: Lab Results  Component Value Date   INR 1.3 (H) 10/20/2022   INR 1.3 (H) 09/08/2022   INR 1.3 (H) 09/07/2022     Non-Invasive Vascular Imaging:   None  Statin:  No. Beta Blocker:  Yes.   Aspirin:  Yes.   ACEI:  No. ARB:  No. CCB use:  Yes Other antiplatelets/anticoagulants:  No.    ASSESSMENT/PLAN: This is a 52 y.o. male with ESRD on HD via a right forearm AVG with concerns of infection. Has been on IV antibiotics at recent hospitalization, restarted on Vanc this admission.Admitted with MRSA bacteremia source unknown but suspected to be AV graft. Clinically the graft does not appear infected. He does have abscess to left arm and also nasal that could be contributing. Blood cultures  NGTD.  Discussed with patient that if graft is infected would require extensive excision of the graft, temporary dialysis catheter placement and planning for new access. At this time would not recommend AV graft excision. Would recommend IV antibiotic treatment  with suppressive therapy per ID. The on call vascular surgeon, Dr. Lenell Antu will see patient later today and provide further recommendations   Graceann Congress PA-C [facility] [phone] 10/23/2022  12:50 PM

## 2022-10-24 ENCOUNTER — Inpatient Hospital Stay (HOSPITAL_COMMUNITY): Payer: Medicaid Other

## 2022-10-24 DIAGNOSIS — W19XXXA Unspecified fall, initial encounter: Secondary | ICD-10-CM | POA: Diagnosis not present

## 2022-10-24 DIAGNOSIS — T827XXA Infection and inflammatory reaction due to other cardiac and vascular devices, implants and grafts, initial encounter: Secondary | ICD-10-CM | POA: Diagnosis not present

## 2022-10-24 DIAGNOSIS — N186 End stage renal disease: Secondary | ICD-10-CM | POA: Diagnosis not present

## 2022-10-24 DIAGNOSIS — R7881 Bacteremia: Secondary | ICD-10-CM | POA: Diagnosis not present

## 2022-10-24 DIAGNOSIS — E119 Type 2 diabetes mellitus without complications: Secondary | ICD-10-CM | POA: Diagnosis not present

## 2022-10-24 LAB — CBC
HCT: 25.4 % — ABNORMAL LOW (ref 39.0–52.0)
Hemoglobin: 7.7 g/dL — ABNORMAL LOW (ref 13.0–17.0)
MCH: 26.7 pg (ref 26.0–34.0)
MCHC: 30.3 g/dL (ref 30.0–36.0)
MCV: 88.2 fL (ref 80.0–100.0)
Platelets: 407 10*3/uL — ABNORMAL HIGH (ref 150–400)
RBC: 2.88 MIL/uL — ABNORMAL LOW (ref 4.22–5.81)
RDW: 16.5 % — ABNORMAL HIGH (ref 11.5–15.5)
WBC: 8.1 10*3/uL (ref 4.0–10.5)
nRBC: 0 % (ref 0.0–0.2)

## 2022-10-24 LAB — GLUCOSE, CAPILLARY
Glucose-Capillary: 186 mg/dL — ABNORMAL HIGH (ref 70–99)
Glucose-Capillary: 143 mg/dL — ABNORMAL HIGH (ref 70–99)

## 2022-10-24 LAB — RENAL FUNCTION PANEL
Albumin: 2.8 g/dL — ABNORMAL LOW (ref 3.5–5.0)
Anion gap: 16 — ABNORMAL HIGH (ref 5–15)
BUN: 55 mg/dL — ABNORMAL HIGH (ref 6–20)
CO2: 22 mmol/L (ref 22–32)
Calcium: 8.2 mg/dL — ABNORMAL LOW (ref 8.9–10.3)
Chloride: 96 mmol/L — ABNORMAL LOW (ref 98–111)
Creatinine, Ser: 9.47 mg/dL — ABNORMAL HIGH (ref 0.61–1.24)
GFR, Estimated: 6 mL/min — ABNORMAL LOW (ref >60)
Glucose, Bld: 135 mg/dL — ABNORMAL HIGH (ref 70–99)
Phosphorus: 5.1 mg/dL — ABNORMAL HIGH (ref 2.5–4.6)
Potassium: 5 mmol/L (ref 3.5–5.1)
Sodium: 134 mmol/L — ABNORMAL LOW (ref 135–145)

## 2022-10-24 LAB — CULTURE, BLOOD (ROUTINE X 2): Culture: NO GROWTH

## 2022-10-24 MED ORDER — HEPARIN SODIUM (PORCINE) 1000 UNIT/ML DIALYSIS
20.0000 [IU]/kg | INTRAMUSCULAR | Status: DC | PRN
  Administered 2022-10-24: 1500 [IU] via INTRAVENOUS_CENTRAL
  Filled 2022-10-24: qty 2

## 2022-10-24 MED ORDER — VANCOMYCIN HCL 750 MG/150ML IV SOLN
750.0000 mg | INTRAVENOUS | Status: DC
  Administered 2022-10-26: 750 mg via INTRAVENOUS
  Filled 2022-10-24: qty 150

## 2022-10-24 MED ORDER — VANCOMYCIN HCL 750 MG/150ML IV SOLN
750.0000 mg | Freq: Once | INTRAVENOUS | Status: AC
  Administered 2022-10-24: 750 mg via INTRAVENOUS
  Filled 2022-10-24: qty 150

## 2022-10-24 NOTE — Progress Notes (Signed)
[facility]  Date of Admission:  10/20/2022     Total days of antibiotics 5          ASSESSMENT:  Mr. Knuckles blood cultures from 10/20/22 have been without growth to date and has TEE scheduled for tomorrow to rule out endocarditis. Vascular Surgery evaluated graft with no clear evidence of infection. Discussed plan of care for continued dose of vancomycin with dialysis and importance of going to dialysis. Monitor cultures until finalized and await TEE results. Continue current dose of vancomycin. Remaining medical and supportive care per Internal Medicine and Nephrology.   PLAN:  Continue current dose of vancomycin. Await results of TEE  Monitor blood cultures for clearance of bacteremia.  Remaining medical and supportive care per Internal Medicine and Nephrology.   I have personally spent 24 minutes involved in face-to-face and non-face-to-face activities for this patient on the day of the visit. Professional time spent includes the following activities: Preparing to see the patient (review of tests), Obtaining and/or reviewing separately obtained history (admission/discharge record), Performing a medically appropriate examination and/or evaluation , Ordering medications/tests/procedures, referring and communicating with other health care professionals, Documenting clinical information in the EMR, Independently interpreting results (not separately reported), Communicating results to the patient/family/caregiver, Counseling and educating the patient/family/caregiver and Care coordination (not separately reported).   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 Has been refusing dialysis.   Allergies  Allergen Reactions   Sulfa Antibiotics Rash    Severe rash.  Severe rash.     Severe rash.   Other Other (See Comments)    Patient states he is allergic to the TB test - unknown reaction during childhood    Tuberculin Ppd     Other Reaction(s): Other (See Comments)   Bactrim [Sulfamethoxazole-Trimethoprim] Rash   Cephalexin Other (See Comments) and Rash    Patient with severe drug reaction including exfoliating skin rash and hypotension after being prescribed both cephalexin and sulfamethoxazole-trimethoprim simultaneously. Favor SMX as much more likely culprit as patient had tolerated other cephalosporins in the past, but cannot say with complete certainty that this was not related to cephalexin.  Other Reaction(s): Other, Other (See Comments)  Patient with severe drug reaction including exfoliating skin rash and hypotension after being prescribed both cephalexin and sulfamethoxazole-trimethoprim simultaneously. Favor SMX as much more likely culprit as patient had tolerated other cephalosporins in the past, but cannot say with complete certainty that this was not related to cephalexin.   "Patient with severe drug reaction including exfoliating skin rash and hypotension after being  prescribed both cephalexin and sulfamethoxazole-trimethoprim simultaneously. Favor SMX as much more likely culprit as patient had tolerated other cephalosporins in the past, but  cannot say with complete certainty that this was not related to cephalexin." As noted in [facility]   "Patient with severe drug reaction including exfoliating skin rash and hypotension after being prescribed both cephalexin and sulfamethoxazole-trimethoprim simultaneously. Favor SMX as much more likely culprit as patient had tolerated other cephalosporins in the past, but cannot say with complete certainty that this was not related to cephalexin." As noted in [facility]   "Patient with severe drug reaction including exfoliating skin rash and hypotension after being prescribed both cephalexin and sulfamethoxazole-trimethoprim simultaneously. Favor SMX as much more likely culprit as patient had tolerated other cephalosporins in the past, but cannot say with complete certainty that this was not related to cephalexin." As noted in [facility]     Patient with severe drug reaction including exfoliating skin rash and hypotension after being prescribed both cephalexin and sulfamethoxazole-trimethoprim simultaneously. Favor SMX as much more likely culprit as patient had tolerated other cephalosporins in the past, but cannot say with complete certainty that this was not related to cephalexin.  "Patient with severe drug reaction including exfoliating skin rash and hypotension after being prescribed both cephalexin and sulfamethoxazole-trimethoprim simultaneously. Favor SMX as much more likely culprit as patient had tolerated other cephalosporins in the past, but cannot say with complete certainty that this was not related to cephalexin." As noted in [facility]  "Patient with severe drug reaction including exfoliating skin rash and hypotension after being prescribed both cephalexin and sulfamethoxazole-trimethoprim simultaneously. Favor SMX as much more likely culprit as patient had tolerated other cephalosporins in the past, but cannot say with complete certainty that this was not related to cephalexin." As noted in  [facility]     Patient with severe drug reaction inc... (TRUNCATED)     Review of Systems: Review of Systems  Constitutional:  Negative for chills, fever and weight loss.  Respiratory:  Negative for cough, shortness of breath and wheezing.   Cardiovascular:  Negative for chest pain and leg swelling.  Gastrointestinal:  Negative for abdominal pain, constipation, diarrhea, nausea and vomiting.  Skin:  Negative for rash.      OBJECTIVE: Vitals:   10/23/22 1900 10/24/22 0300 10/24/22 0733 10/24/22 1147  BP: (!) 170/88  (!) 150/70 (!) 148/70  Pulse: 82   76  Resp: 15  16 18   Temp: 97.8 F (36.6 C)  97.6 F (36.4 C) 98.3 F (36.8 C)  TempSrc: Oral  Oral Oral  SpO2: 98%  98% 90%  Weight:  75.5 kg    Height:       Body mass index is 26.08 kg/m.  Physical Exam Constitutional:      General: He is sleeping. He is not in acute distress.    Appearance: He is well-developed.     Comments: Sleeping but arousable  Cardiovascular:     Rate and Rhythm: Normal rate and regular rhythm.     Heart sounds: Normal heart sounds.  Pulmonary:     Effort: Pulmonary effort is normal.     Breath sounds: Normal breath sounds.  Skin:    General: Skin is warm and dry.  Neurological:     Mental Status: He is oriented to person, place, and time.  Psychiatric:        Behavior: Behavior normal.        Thought Content: Thought content normal.  Judgment: Judgment normal.     Lab Results Lab Results  Component Value Date   WBC 8.1 10/24/2022   HGB 7.7 (L) 10/24/2022   HCT 25.4 (L) 10/24/2022   MCV 88.2 10/24/2022   PLT 407 (H) 10/24/2022    Lab Results  Component Value Date   CREATININE 9.47 (H) 10/24/2022   BUN 55 (H) 10/24/2022   NA 134 (L) 10/24/2022   K 5.0 10/24/2022   CL 96 (L) 10/24/2022   CO2 22 10/24/2022    Lab Results  Component Value Date   ALT 27 10/21/2022   AST 22 10/21/2022   ALKPHOS 223 (H) 10/21/2022   BILITOT 0.5 10/21/2022      Microbiology: Recent Results (from the past 240 hour(s))  Blood culture (routine x 2)     Status: None (Preliminary result)   Collection Time: 10/20/22  6:43 PM   Specimen: BLOOD LEFT ARM  Result Value Ref Range Status   Specimen Description BLOOD LEFT ARM  Final   Special Requests   Final    BOTTLES DRAWN AEROBIC AND ANAEROBIC Blood Culture results may not be optimal due to an inadequate volume of blood received in culture bottles   Culture   Final    NO GROWTH 4 DAYS Performed at [facility], [address], [place], [place] [address]    Report Status PENDING  Incomplete  Blood culture (routine x 2)     Status: None (Preliminary result)   Collection Time: 10/20/22  9:47 PM   Specimen: BLOOD LEFT HAND  Result Value Ref Range Status   Specimen Description BLOOD LEFT HAND  Final   Special Requests   Final    BOTTLES DRAWN AEROBIC AND ANAEROBIC Blood Culture adequate volume   Culture   Final    NO GROWTH 4 DAYS Performed at [facility], [address], [place], [place] [address]    Report Status PENDING  Incomplete     Marcos Eke, NP [facility]  10/24/2022  2:57 PM

## 2022-10-24 NOTE — Progress Notes (Signed)
Pt refusing to come to HD. Pt educated that machine set up for him, if he doesn't come now that all the supplies for his tx will have to be torn down and thrown away and wasted which is a lot of money wasted. He still refused HD.

## 2022-10-24 NOTE — Progress Notes (Signed)
Received patient in bed to unit.  Alert and oriented.  Informed consent signed and in chart.   TX duration:3hours50min  Patient tolerated well.  Transported back to the room  Alert, without acute distress.  Hand-off given to patient's nurse.   Access used: graft Access issues: none  Total UF removed: 2500 ml Medication(s) given: vanc 750mg /178ml Post HD VS: 183/70 Post HD weight: 72.5kg    10/24/22 2034  Vitals  Temp 98.4 F (36.9 C)  BP (!) 183/70  MAP (mmHg) 99  BP Location Left Arm  BP Method Automatic  Patient Position (if appropriate) Lying  Pulse Rate 86  Pulse Rate Source Monitor  ECG Heart Rate 86  Resp (!) 8  Oxygen Therapy  SpO2 100 %  O2 Device Nasal Cannula  O2 Flow Rate (L/min) 2 L/min  Patient Activity (if Appropriate) In bed  Pulse Oximetry Type Continuous  Post Treatment  Dialyzer Clearance Lightly streaked  Duration of HD Treatment -hour(s) 3.45 hour(s)  Hemodialysis Intake (mL) 0 mL  Liters Processed 78.7  Fluid Removed (mL) 2500 mL  Tolerated HD Treatment Yes  Post-Hemodialysis Comments HD tx completed as expected, tolerated vwell, no complaints, pt is stable.  AVG/AVF Arterial Site Held (minutes) 10 minutes  AVG/AVF Venous Site Held (minutes) 20 minutes  Note  Observations pt is irs alert, oriented, vernally responsive, no acute distress, and stable.  Fistula / Graft Right Forearm  No placement date or time found.   Placed prior to admission: Yes  Orientation: Right  Access Location: Forearm  Site Condition No complications  Fistula / Graft Assessment Present;Thrill;Bruit  Status Deaccessed  Drainage Description None

## 2022-10-24 NOTE — Progress Notes (Signed)
Rt dialysis access duplex has been completed.   Preliminary results in CV Proc.   [name] 10/24/2022 9:26 AM

## 2022-10-24 NOTE — Progress Notes (Signed)
Physical Therapy Treatment Patient Details Name: [name] MRN: [id number] DOB: 1971-05-15 Today's Date: 10/24/2022   History of Present Illness 52 y/o M admitted to [facility] on 6/2 after a fall, admitted with anion gap metabolic acidosis. Fall resulted in R knee and R elbow abrasions but x-ray showing no acute fracture. PMHx: ESRD on HDU, DM, anemia of chronic kidney disease, recent hospitalization in [place] for MRSA bacteremia and right AVG requiring declotting process but leaving AMA    PT Comments    Pt tolerated today's session well with encouragement to participate initially, but able to progress to hallway ambulation. Pt with RW trial today with improved balance but requiring cueing for proper use and management. Discharge recommendations remain appropriate to maximize independence with mobility prior to discharge as pt has little support, acute PT will continue to follow pt to address deficits.     Recommendations for follow up therapy are one component of a multi-disciplinary discharge planning process, led by the attending physician.  Recommendations may be updated based on patient status, additional functional criteria and insurance authorization.  Follow Up Recommendations  Can patient physically be transported by private vehicle: Yes    Assistance Recommended at Discharge Frequent or constant Supervision/Assistance  Patient can return home with the following A little help with walking and/or transfers;Help with stairs or ramp for entrance;Assist for transportation   Equipment Recommendations  Rolling walker (2 wheels)    Recommendations for Other Services       Precautions / Restrictions Precautions Precautions: Fall;Other (comment) Precaution Comments: watch O2 Restrictions Weight Bearing Restrictions: No     Mobility  Bed Mobility Overal bed mobility: Needs Assistance Bed Mobility: Supine to Sit, Sit to Supine     Supine to sit: Supervision, HOB  elevated Sit to supine: Supervision, HOB elevated   General bed mobility comments: with increased time and use of bed rail    Transfers Overall transfer level: Needs assistance Equipment used: Rolling walker (2 wheels) Transfers: Sit to/from Stand Sit to Stand: Min guard           General transfer comment: Min guard to steady and maintain balance    Ambulation/Gait Ambulation/Gait assistance: Min assist, Min guard Gait Distance (Feet): 75 Feet Assistive device: Rolling walker (2 wheels) Gait Pattern/deviations: Step-through pattern, Decreased stride length, Wide base of support, Drifts right/left, Trunk flexed Gait velocity: decreased     General Gait Details: balance improved with RW but requires education and cueing for proper use. Pt with downward gaze and increased trunk flexion, cued for forward gaze and upright posture, minG-minA provided for safety and balance, especially with 180 degree turns and obstacle negotiation in busy hallways   Stairs             Wheelchair Mobility    Modified Rankin (Stroke Patients Only)       Balance Overall balance assessment: Needs assistance Sitting-balance support: No upper extremity supported, Feet supported Sitting balance-Leahy Scale: Fair     Standing balance support: During functional activity, Bilateral upper extremity supported, Reliant on assistive device for balance Standing balance-Leahy Scale: Poor Standing balance comment: reliant on RW for balance                            Cognition Arousal/Alertness: Awake/alert, Lethargic Behavior During Therapy: Impulsive Overall Cognitive Status: No family/caregiver present to determine baseline cognitive functioning  General Comments: pt sleeping upon arrival, requiring increased time and encouragement to participate but able to follow commands        Exercises      General Comments General comments  (skin integrity, edema, etc.): pt satting in mid 80s on room air while sleeping, while seated EOB pt still in mid to upper 80s on room air, improving when 2L O2 Lacey placed up to ~96%. Pt attempted ambulation on room air, maintaining 90-92% with ambulation, RN notified      Pertinent Vitals/Pain Pain Assessment Pain Assessment: Faces Faces Pain Scale: No hurt    Home Living Family/patient expects to be discharged to:: Shelter/Homeless                   Additional Comments: living in the streets for ~1 year, says he is supposed to be on 2L O2 but doesn't have any tanks, no DME, no friends or family identified at this time    Prior Function            PT Goals (current goals can now be found in the care plan section) Acute Rehab PT Goals Patient Stated Goal: go to rehab to get better PT Goal Formulation: With patient Time For Goal Achievement: 11/05/22 Potential to Achieve Goals: Good Progress towards PT goals: Progressing toward goals    Frequency    Min 2X/week      PT Plan Current plan remains appropriate    Co-evaluation              AM-PAC PT "6 Clicks" Mobility   Outcome Measure  Help needed turning from your back to your side while in a flat bed without using bedrails?: A Little Help needed moving from lying on your back to sitting on the side of a flat bed without using bedrails?: A Little Help needed moving to and from a bed to a chair (including a wheelchair)?: A Little Help needed standing up from a chair using your arms (e.g., wheelchair or bedside chair)?: A Little Help needed to walk in hospital room?: A Little Help needed climbing 3-5 steps with a railing? : Total 6 Click Score: 16    End of Session Equipment Utilized During Treatment: Gait belt;Oxygen Activity Tolerance: Patient tolerated treatment well Patient left: in bed;with call bell/phone within reach Nurse Communication: Mobility status PT Visit Diagnosis: Unsteadiness on feet  (R26.81);Muscle weakness (generalized) (M62.81);Repeated falls (R29.6);Difficulty in walking, not elsewhere classified (R26.2)     Time: 1610-9604 PT Time Calculation (min) (ACUTE ONLY): 21 min  Charges:  $Gait Training: 8-22 mins                     Lindalou Hose, PT DPT Acute Rehabilitation Services Office [phone]    Leonie Man 10/24/2022, 4:24 PM

## 2022-10-24 NOTE — Progress Notes (Signed)
Duplex personally reviewed - no evidence of perigraft collection. No evidence of graft infection. Good flow noted in duplex. No role for vascular intervention. Please call for questions.  Rande Brunt. Lenell Antu, MD [facility] Phone Number: [phone] 10/24/2022 10:05 AM

## 2022-10-24 NOTE — Progress Notes (Signed)
Patient ID: [name], male   DOB: 1971/02/10, 52 y.o.   MRN: [id number] S: Pt refused HD this morning because he wanted to eat breakfast but states that he will go this afternoon. O:BP (!) 150/70 (BP Location: Left Wrist)   Pulse 82   Temp 97.6 F (36.4 C) (Oral)   Resp 16   Ht 5\' 7"  (1.702 m)   Wt 75.5 kg   SpO2 98%   BMI 26.08 kg/m  No intake or output data in the 24 hours ending 10/24/22 1039 Intake/Output: I/O last 3 completed shifts: In: 120 [P.O.:120] Out: -   Intake/Output this shift:  No intake/output data recorded. Weight change: 1.424 kg Gen: NAD CVS: RRR  Resp:CTA Abd: +BS, soft, NT/ND  Ext: no edema, RAVG +T/B  Recent Labs  Lab 10/20/22 1708 10/21/22 0549 10/24/22 0740  NA 136 138 134*  K 5.0 4.7 5.0  CL 97* 102 96*  CO2 21* 22 22  GLUCOSE 151* 210* 135*  BUN 60* 65* 55*  CREATININE 9.49* 10.26* 9.47*  ALBUMIN  --  2.7* 2.8*  CALCIUM 8.1* 7.7* 8.2*  PHOS  --  6.2* 5.1*  AST  --  22  --   ALT  --  27  --    Liver Function Tests: Recent Labs  Lab 10/21/22 0549 10/24/22 0740  AST 22  --   ALT 27  --   ALKPHOS 223*  --   BILITOT 0.5  --   PROT 7.0  --   ALBUMIN 2.7* 2.8*   No results for input(s): "LIPASE", "AMYLASE" in the last 168 hours. No results for input(s): "AMMONIA" in the last 168 hours. CBC: Recent Labs  Lab 10/20/22 1708 10/21/22 0549 10/24/22 0740  WBC 8.5 7.9 8.1  NEUTROABS 6.1 5.6  --   HGB 8.4* 7.9* 7.7*  HCT 28.7* 25.6* 25.4*  MCV 90.8 88.3 88.2  PLT 454* 435* 407*   Cardiac Enzymes: No results for input(s): "CKTOTAL", "CKMB", "CKMBINDEX", "TROPONINI" in the last 168 hours. CBG: Recent Labs  Lab 10/22/22 1939 10/23/22 0748 10/23/22 1604 10/23/22 2032 10/24/22 0909  GLUCAP 140* 104* 203* 157* 186*    Iron Studies: No results for input(s): "IRON", "TIBC", "TRANSFERRIN", "FERRITIN" in the last 72 hours. Studies/Results: VAS US DUPLEX DIALYSIS ACCESS (AVF, AVG)  Result Date: 10/24/2022 DIALYSIS ACCESS  Patient Name:  KONSTANTY SEIDNER  Date of Exam:   10/24/2022 Medical Rec #: [id number]        Accession #:    [id number] Date of Birth: February 05, 1971       Patient Gender: M Patient Age:   89 years Exam Location:  [facility] Procedure:      VAS US DUPLEX DIALYSIS ACCESS (AVF, AVG) Referring Phys: Heath Lark --------------------------------------------------------------------------------  Reason for Exam: Infection of AV graft for dialysis. Access Site: Right Upper Extremity. Access Type: Forearm loop AVG. History: 02/20/21 Rt AVG creation. Comparison Study: no prior Performing Technologist: Argentina Ponder RVS  Examination Guidelines: A complete evaluation includes B-mode imaging, spectral Doppler, color Doppler, and power Doppler as needed of all accessible portions of each vessel. Unilateral testing is considered an integral part of a complete examination. Limited examinations for reoccurring indications may be performed as noted.  Findings:   +--------------------+----------+-----------------+--------+ AVG                 PSV (cm/s)Flow Vol (mL/min)Describe +--------------------+----------+-----------------+--------+ Native artery inflow   420          1731                +--------------------+----------+-----------------+--------+  Arterial anastomosis   494                              +--------------------+----------+-----------------+--------+ Prox graft             160                              +--------------------+----------+-----------------+--------+ Mid graft              191                              +--------------------+----------+-----------------+--------+ Distal graft           140                              +--------------------+----------+-----------------+--------+ Venous anastomosis     349                              +--------------------+----------+-----------------+--------+ Venous outflow         270                               +--------------------+----------+-----------------+--------+  Summary: Patent arteriovenous graft. *See table(s) above for measurements and observations.  Diagnosing physician: Heath Lark Electronically signed by Heath Lark on 10/24/2022 at 10:05:07 AM.    --------------------------------------------------------------------------------   Final     calcitRIOL  0.25 mcg Oral Q T,Th,Sat-1800   calcium acetate  2,001 mg Oral TID WC   carvedilol  25 mg Oral BID WC   Chlorhexidine Gluconate Cloth  6 each Topical Q0600   heparin injection (subcutaneous)  5,000 Units Subcutaneous Q8H   hydrALAZINE  100 mg Oral Q8H   insulin aspart  0-6 Units Subcutaneous TID WC   insulin glargine-yfgn  7 Units Subcutaneous QHS   isosorbide mononitrate  60 mg Oral Daily   pantoprazole  40 mg Oral Daily    BMET    Component Value Date/Time   NA 134 (L) 10/24/2022 0740   NA 138 05/09/2020 1455   K 5.0 10/24/2022 0740   CL 96 (L) 10/24/2022 0740   CO2 22 10/24/2022 0740   GLUCOSE 135 (H) 10/24/2022 0740   BUN 55 (H) 10/24/2022 0740   BUN 26 (H) 05/09/2020 1455   CREATININE 9.47 (H) 10/24/2022 0740   CREATININE 0.93 09/06/2014 1657   CALCIUM 8.2 (L) 10/24/2022 0740   CALCIUM 7.6 (L) 05/12/2021 0923   GFRNONAA 6 (L) 10/24/2022 0740   GFRNONAA >89 09/06/2014 1657   GFRAA 37 (L) 05/09/2020 1455   GFRAA >89 09/06/2014 1657   CBC    Component Value Date/Time   WBC 8.1 10/24/2022 0740   RBC 2.88 (L) 10/24/2022 0740   HGB 7.7 (L) 10/24/2022 0740   HGB 13.2 05/09/2020 1455   HCT 25.4 (L) 10/24/2022 0740   HCT 41.5 05/09/2020 1455   PLT 407 (H) 10/24/2022 0740   PLT 418 05/09/2020 1455   MCV 88.2 10/24/2022 0740   MCV 84 05/09/2020 1455   MCH 26.7 10/24/2022 0740   MCHC 30.3 10/24/2022 0740   RDW 16.5 (H) 10/24/2022 0740   RDW 13.1 05/09/2020 1455   LYMPHSABS 1.1 10/21/2022 0549   LYMPHSABS 1.6  05/09/2020 1455   MONOABS 0.9 10/21/2022 0549   EOSABS 0.3 10/21/2022 0549   EOSABS 0.1 05/09/2020 1455    BASOSABS 0.1 10/21/2022 0549   BASOSABS 0.0 05/09/2020 1455    OP HD: [facility] TTS  4h  400/ 800   61.5kg  2/2.5 bath  RFA AVG   - Heparin 2000 units IV TIW - last HD in [place] recorded on 09/06/22, post wt 74.3kg (dry wt was 61.5 back then too) - rocaltrol 0.25 mcg po tiw - mircera 100 mcg IV q 2 wks, last 09/06/22       Assessment/ Plan: MRSA bacteremia - at OSH in [place] in May 2024, pt was rx'd w/ IV vanc but left after his course could be completed. Likely his last dose was about 4-5 days ago. Repeat blood cx's have already been sent here. Pharmacy has been consulted to resume IV vanc here. Per pmd.  ID following.  SP declot R forearm AVG - possibly done no 5/03, done out of town also. Good bruit here today.  Complicated by bacteremia.  VVS did evaluate AVG and does not feel this is the source of infection as he has an abscess on his left arm and nasal septum.   ESRD - on HD TTS. Plan for HD today to continue on his normal schedule. Still has chair at [facility].  HTN/ volume - volume overload, which is chronic for him. He usually runs 6-10 kg over his dry wt. Max UF w/ HD 10/21/2022. Net UF 3.8 liters. Still very much over OP EDW. Continue to lower volume.  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  Rec'd Aranesp 150 MCG 10/21/2022.  MBD ckd - Ca in range. Phos at goal at 5.1. Resumed rocaltrol po tiw. Pth 167.  Homelessness - social issues. Interested in SNF placement  Irena Cords, MD [facility]

## 2022-10-24 NOTE — Evaluation (Signed)
Occupational Therapy Evaluation Patient Details Name: [name] MRN: [id number] DOB: 1971/03/09 Today's Date: 10/24/2022   History of Present Illness 52 y/o M admitted to [facility] on 6/2 after a fall, admitted with anion gap metabolic acidosis. Fall resulted in R knee and R elbow abrasions but x-ray showing no acute fracture. PMHx: ESRD on HDU, DM, anemia of chronic kidney disease, recent hospitalization in [place] for MRSA bacteremia and right AVG requiring declotting process but leaving AMA   Clinical Impression   At baseline, pt is Independent to Mod I for ADLs and functional mobility without an AD. Pt presents with decreased activity tolerance, decreased general B UE strength, decreased balance during functional tasks, decreased safety awareness, and decreased safety and independence with ADLs and functional mobility. Pt presents with poor insight into deficits and low motivation to participate in skilled OT session with pt requiring max encouragement throughout session to sustain participation and refusing vision screen. Pt currently demonstrates ability to complete UB ADLs Independent to Supervision, LB ADLs with Supervision to Min guard assist, and functional transfers/mobility without an AD with Min guard assist and cues for safety. Pt will benefit from acute skilled OT services to address deficits outlined below and increase safety and independence with ADLs and functional transfers/mobility. Post acute discharge, pt will benefit from intensive inpatient skilled OT services < 3 hours per day.      Recommendations for follow up therapy are one component of a multi-disciplinary discharge planning process, led by the attending physician.  Recommendations may be updated based on patient status, additional functional criteria and insurance authorization.   Assistance Recommended at Discharge Intermittent Supervision/Assistance  Patient can return home with the following A little help with  walking and/or transfers;A little help with bathing/dressing/bathroom;Assistance with cooking/housework;Direct supervision/assist for medications management;Direct supervision/assist for financial management;Assist for transportation;Help with stairs or ramp for entrance    Functional Status Assessment  Patient has had a recent decline in their functional status and demonstrates the ability to make significant improvements in function in a reasonable and predictable amount of time.  Equipment Recommendations  Other (comment) (defer to next level of care)    Recommendations for Other Services       Precautions / Restrictions Precautions Precautions: Fall;Other (comment) Precaution Comments: watch O2 Restrictions Weight Bearing Restrictions: No      Mobility Bed Mobility Overal bed mobility: Needs Assistance Bed Mobility: Supine to Sit, Sit to Supine     Supine to sit: Supervision, HOB elevated Sit to supine: Supervision, HOB elevated   General bed mobility comments: with increased time and use of bed rail    Transfers Overall transfer level: Needs assistance Equipment used: None Transfers: Sit to/from Stand, Bed to chair/wheelchair/BSC Sit to Stand: Min guard     Step pivot transfers: Min guard     General transfer comment: Min guard to steady and maintain balance      Balance Overall balance assessment: Needs assistance Sitting-balance support: No upper extremity supported, Feet supported Sitting balance-Leahy Scale: Fair     Standing balance support: No upper extremity supported, During functional activity Standing balance-Leahy Scale: Fair                             ADL either performed or assessed with clinical judgement   ADL Overall ADL's : Needs assistance/impaired Eating/Feeding: Independent;Sitting   Grooming: Independent;Sitting   Upper Body Bathing: Supervision/ safety;Cueing for safety;Cueing for sequencing;Sitting   Lower Body  Bathing: Min guard;Cueing for sequencing;Cueing for safety;Sit to/from stand   Upper Body Dressing : Modified independent;Sitting   Lower Body Dressing: Supervision/safety;Sit to/from stand   Toilet Transfer: Min guard;Ambulation;Regular Toilet   Toileting- Architect and Hygiene: Min guard;Cueing for safety;Sit to/from stand       Functional mobility during ADLs: Min guard;Cueing for safety General ADL Comments: Pt with decreased activity tolerance and poor insight into deficits.     Vision Baseline Vision/History: 1 Wears glasses Ability to See in Adequate Light: 0 Adequate (Right eye adequate, Left eye impaired) Patient Visual Report: Other (comment) (Pt reports decreased vision in Left year for approx. 1 year. Pt reports he "doesn't see anything" on Left side with pt unable to provide additional details.) Additional Comments: Pt refused vision screen. OT educated pt in purpose of vision screen with pt verbalizing understanding and continuing to refuse participation in vision screen.     Perception     Praxis Praxis Praxis tested?: Deficits Deficits: Organization    Pertinent Vitals/Pain Pain Assessment Pain Assessment: No/denies pain     Hand Dominance Left   Extremity/Trunk Assessment Upper Extremity Assessment Upper Extremity Assessment: Generalized weakness   Lower Extremity Assessment Lower Extremity Assessment: Defer to PT evaluation   Cervical / Trunk Assessment Cervical / Trunk Assessment: Kyphotic   Communication Communication Communication: No difficulties   Cognition Arousal/Alertness: Lethargic Behavior During Therapy: Restless, Agitated, Impulsive Overall Cognitive Status: No family/caregiver present to determine baseline cognitive functioning                                 General Comments: Pt refuses answering orientation questions. Pt demonstrates ability to follow 1 step commands with increased time. Pt demonstrates  poor safety awareness and poor insight into deficits.     General Comments  VSS throughout session on 2L continuous O2 through nasal cannula.    Exercises     Shoulder Instructions      Home Living Family/patient expects to be discharged to:: Shelter/Homeless                                 Additional Comments: living in the streets for ~1 year, says he is supposed to be on 2L O2 but doesn't have any tanks, no DME, no friends or family identified at this time      Prior Functioning/Environment Prior Level of Function : Independent/Modified Independent;History of Falls (last six months)             Mobility Comments: reports ~10-11 falls in the past 6 months, often tripping or poor obstacle negotiation. Reports L eye vision difficulty for ~1 year ADLs Comments: Independent        OT Problem List: Decreased strength;Decreased activity tolerance;Impaired balance (sitting and/or standing);Decreased safety awareness      OT Treatment/Interventions: Self-care/ADL training;Therapeutic exercise;Energy conservation;Therapeutic activities;Patient/family education;Visual/perceptual remediation/compensation;Balance training    OT Goals(Current goals can be found in the care plan section) Acute Rehab OT Goals Patient Stated Goal: to go to SNF OT Goal Formulation: With patient Time For Goal Achievement: 11/07/22 Potential to Achieve Goals: Fair ADL Goals Pt Will Perform Grooming: standing;Independently Pt Will Perform Lower Body Bathing: with modified independence;sit to/from stand Pt Will Perform Lower Body Dressing: with modified independence;sit to/from stand Pt/caregiver will Perform Home Exercise Program: Increased strength;Both right and left upper extremity;With theraband;With written HEP provided;With Supervision  OT Frequency: Min 2X/week    Co-evaluation              AM-PAC OT "6 Clicks" Daily Activity     Outcome Measure Help from another person  eating meals?: None Help from another person taking care of personal grooming?: None (in sitting) Help from another person toileting, which includes using toliet, bedpan, or urinal?: A Little Help from another person bathing (including washing, rinsing, drying)?: A Little Help from another person to put on and taking off regular upper body clothing?: None Help from another person to put on and taking off regular lower body clothing?: A Little 6 Click Score: 21   End of Session Equipment Utilized During Treatment: Oxygen Nurse Communication: Mobility status;Other (comment) (Pt requiring max encouragement for participation and pt poor safety awareness.)  Activity Tolerance: Other (comment) (Pt limited by low level of motivation with pt requiring max encouragement to participate throughout session.) Patient left: in bed;with call bell/phone within reach  OT Visit Diagnosis: Unsteadiness on feet (R26.81);Other abnormalities of gait and mobility (R26.89);Repeated falls (R29.6);History of falling (Z91.81);Muscle weakness (generalized) (M62.81);Other (comment) (Decreased activity tolerance.)                Time: 0981-1914 OT Time Calculation (min): 16 min Charges:  OT General Charges $OT Visit: 1 Visit OT Evaluation $OT Eval Low Complexity: 1 Low  [address]" M., OTR/L, MA Acute Rehab [phone]   Lendon Colonel 10/24/2022, 3:40 PM

## 2022-10-24 NOTE — Progress Notes (Addendum)
PROGRESS NOTE        PATIENT DETAILS Name: [name] Age: 52 y.o. Sex: male Date of Birth: 1970/09/01 Admit Date: 10/20/2022 Admitting Physician Starleen Arms, MD [id number]  Brief Summary: Patient is a 52 y.o.  male with tree of ESRD on HD TTS, HTN, DM-2-who was recently admitted to numerous hospitals in [place] in the setting of MRSA bacteremia/AV graft infection-he unfortunately left AMA from the hospital [place] before he finished completing treatment-presented to the ED following a mechanical fall.    Significant events: 6/2>> admit to [facility]  Significant studies: 6/2>> x-ray right elbow: No fracture. 6/2>> x-ray right knee: No fracture 6/2>> CXR: No active disease. 6/4>> echo: EF 20-25%, consider evaluation for infiltrative cardiomyopathy 6 6>> Doppler of AV graft: Patent AV graft-no fluid collection seen.  Significant microbiology data: 6/2>> blood culture: No growth  Procedures: None  Consults: ID Nephrology  Subjective: No nausea or vomiting.  Refused to go to dialysis this morning because he had not had breakfast yet-now plans to go this afternoon.  Objective: Vitals: Blood pressure (!) 150/70, pulse 82, temperature 97.6 F (36.4 C), temperature source Oral, resp. rate 16, height 5\' 7"  (1.702 m), weight 75.5 kg, SpO2 98 %.   Exam: Gen Exam:Alert awake-not in any distress HEENT:atraumatic, normocephalic Chest: B/L clear to auscultation anteriorly CVS:S1S2 regular Abdomen:soft non tender, non distended Extremities:no edema Neurology: Non focal Skin: no rash  Pertinent Labs/Radiology:    Latest Ref Rng & Units 10/24/2022    7:40 AM 10/21/2022    5:49 AM 10/20/2022    5:08 PM  CBC  WBC 4.0 - 10.5 K/uL 8.1  7.9  8.5   Hemoglobin 13.0 - 17.0 g/dL 7.7  7.9  8.4   Hematocrit 39.0 - 52.0 % 25.4  25.6  28.7   Platelets 150 - 400 K/uL 407  435  454     Lab Results  Component Value Date   NA 134 (L) 10/24/2022   K  5.0 10/24/2022   CL 96 (L) 10/24/2022   CO2 22 10/24/2022      Assessment/Plan: MRSA bacteremia due to possibly infected AV graft Afebrile-no leukocytosis Continue IV vancomycin Cardiology consulted for TEE Per vascular surgery-no evidence of infection of AV graft-no fluid collection around AV graft on Doppler. ID following.   ESRD on HD TTS Nephrology following and directing care See above regarding concern for infected AV graft.  Acute on chronic systolic/diastolic heart failure Remains volume overloaded Volume removal with HD Per cardiology-no concern for infiltrative cardiomyopathy Continue Coreg/hydralazine/Imdur Per cardiology-if compliant with medications-repeat echocardiogram in several months to recheck EF  HTN Previously noncompliant-now with significant cardiac issues-extensive counseling-he seems to be very motivated. BP fluctuating but overall better-continue Imdur/hydralazine/Coreg  DM-2 (A1c 7.7 on 4/21) CBG stable Continue Semglee 7 units daily and SSI.  Recent Labs    10/23/22 1604 10/23/22 2032 10/24/22 0909  GLUCAP 203* 157* 186*     Normocytic anemia Secondary to ESRD Aranesp/iron per nephrology service  GERD PPI  Mechanical fall  Recurrent issue PT/OT eval-SNF recommended  BMI: Estimated body mass index is 26.08 kg/m as calculated from the following:   Height as of this encounter: 5\' 7"  (1.702 m).   Weight as of this encounter: 75.5 kg.   Code status:   Code Status: Full Code   DVT Prophylaxis: heparin injection 5,000  Units Start: 10/21/22 1400 SCDs Start: 10/20/22 1927    Family Communication: None at bedside   Disposition Plan: Status is: Inpatient Remains inpatient appropriate because: Severity of illness   Planned Discharge Destination:Skilled nursing facility   Diet: Diet Order             Diet renal with fluid restriction Fluid restriction: 1200 mL Fluid; Room service appropriate? No; Fluid consistency: Thin   Diet effective now                     Antimicrobial agents: Anti-infectives (From admission, onward)    Start     Dose/Rate Route Frequency Ordered Stop   10/26/22 2000  vancomycin (VANCOREADY) IVPB 750 mg/150 mL        750 mg 150 mL/hr over 60 Minutes Intravenous Every T-Th-Sa (Hemodialysis) 10/24/22 1016     10/22/22 1200  vancomycin (VANCOREADY) IVPB 750 mg/150 mL  Status:  Discontinued        750 mg 150 mL/hr over 60 Minutes Intravenous Every T-Th-Sa (Hemodialysis) 10/20/22 2111 10/24/22 1016   10/21/22 1800  vancomycin (VANCOREADY) IVPB 750 mg/150 mL        750 mg 150 mL/hr over 60 Minutes Intravenous  Once 10/21/22 1327 10/21/22 1712   10/21/22 1200  vancomycin (VANCOREADY) IVPB 750 mg/150 mL  Status:  Discontinued        750 mg 150 mL/hr over 60 Minutes Intravenous To Hemodialysis 10/20/22 2111 10/21/22 1327        MEDICATIONS: Scheduled Meds:  calcitRIOL  0.25 mcg Oral Q T,Th,Sat-1800   calcium acetate  2,001 mg Oral TID WC   carvedilol  25 mg Oral BID WC   Chlorhexidine Gluconate Cloth  6 each Topical Q0600   heparin injection (subcutaneous)  5,000 Units Subcutaneous Q8H   hydrALAZINE  100 mg Oral Q8H   insulin aspart  0-6 Units Subcutaneous TID WC   insulin glargine-yfgn  7 Units Subcutaneous QHS   isosorbide mononitrate  60 mg Oral Daily   pantoprazole  40 mg Oral Daily   Continuous Infusions:  [START ON 10/26/2022] vancomycin     PRN Meds:.acetaminophen **OR** acetaminophen, heparin, hydrALAZINE, melatonin, ondansetron (ZOFRAN) IV, oxyCODONE-acetaminophen   I have personally reviewed following labs and imaging studies  LABORATORY DATA: CBC: Recent Labs  Lab 10/20/22 1708 10/21/22 0549 10/24/22 0740  WBC 8.5 7.9 8.1  NEUTROABS 6.1 5.6  --   HGB 8.4* 7.9* 7.7*  HCT 28.7* 25.6* 25.4*  MCV 90.8 88.3 88.2  PLT 454* 435* 407*     Basic Metabolic Panel: Recent Labs  Lab 10/20/22 1708 10/21/22 0549 10/24/22 0740  NA 136 138 134*  K 5.0 4.7  5.0  CL 97* 102 96*  CO2 21* 22 22  GLUCOSE 151* 210* 135*  BUN 60* 65* 55*  CREATININE 9.49* 10.26* 9.47*  CALCIUM 8.1* 7.7* 8.2*  MG 2.6* 2.5*  --   PHOS  --  6.2* 5.1*     GFR: Estimated Creatinine Clearance: 8.8 mL/min (A) (by C-G formula based on SCr of 9.47 mg/dL (H)).  Liver Function Tests: Recent Labs  Lab 10/21/22 0549 10/24/22 0740  AST 22  --   ALT 27  --   ALKPHOS 223*  --   BILITOT 0.5  --   PROT 7.0  --   ALBUMIN 2.7* 2.8*    No results for input(s): "LIPASE", "AMYLASE" in the last 168 hours. No results for input(s): "AMMONIA" in the last 168 hours.  Coagulation  Profile: Recent Labs  Lab 10/20/22 2148  INR 1.3*     Cardiac Enzymes: No results for input(s): "CKTOTAL", "CKMB", "CKMBINDEX", "TROPONINI" in the last 168 hours.  BNP (last 3 results) No results for input(s): "PROBNP" in the last 8760 hours.  Lipid Profile: No results for input(s): "CHOL", "HDL", "LDLCALC", "TRIG", "CHOLHDL", "LDLDIRECT" in the last 72 hours.  Thyroid Function Tests: No results for input(s): "TSH", "T4TOTAL", "FREET4", "T3FREE", "THYROIDAB" in the last 72 hours.  Anemia Panel: No results for input(s): "VITAMINB12", "FOLATE", "FERRITIN", "TIBC", "IRON", "RETICCTPCT" in the last 72 hours.   Urine analysis:    Component Value Date/Time   COLORURINE YELLOW 02/19/2021 0045   APPEARANCEUR HAZY (A) 02/19/2021 0045   APPEARANCEUR Clear 05/09/2020 1509   LABSPEC 1.013 02/19/2021 0045   PHURINE 5.0 02/19/2021 0045   GLUCOSEU >=500 (A) 02/19/2021 0045   HGBUR MODERATE (A) 02/19/2021 0045   BILIRUBINUR NEGATIVE 02/19/2021 0045   BILIRUBINUR Negative 05/09/2020 1509   KETONESUR NEGATIVE 02/19/2021 0045   PROTEINUR >=300 (A) 02/19/2021 0045   UROBILINOGEN 0.2 02/04/2020 1612   UROBILINOGEN 0.2 02/16/2014 0045   NITRITE NEGATIVE 02/19/2021 0045   LEUKOCYTESUR NEGATIVE 02/19/2021 0045    Sepsis Labs: Lactic Acid, Venous    Component Value Date/Time   LATICACIDVEN  1.4 10/20/2022 1708    MICROBIOLOGY: Recent Results (from the past 240 hour(s))  Blood culture (routine x 2)     Status: None (Preliminary result)   Collection Time: 10/20/22  6:43 PM   Specimen: BLOOD LEFT ARM  Result Value Ref Range Status   Specimen Description BLOOD LEFT ARM  Final   Special Requests   Final    BOTTLES DRAWN AEROBIC AND ANAEROBIC Blood Culture results may not be optimal due to an inadequate volume of blood received in culture bottles   Culture   Final    NO GROWTH 4 DAYS Performed at [facility], [address] [place], [place] [address]    Report Status PENDING  Incomplete  Blood culture (routine x 2)     Status: None (Preliminary result)   Collection Time: 10/20/22  9:47 PM   Specimen: BLOOD LEFT HAND  Result Value Ref Range Status   Specimen Description BLOOD LEFT HAND  Final   Special Requests   Final    BOTTLES DRAWN AEROBIC AND ANAEROBIC Blood Culture adequate volume   Culture   Final    NO GROWTH 4 DAYS Performed at [facility], [address], [place], [place] [address]    Report Status PENDING  Incomplete    RADIOLOGY STUDIES/RESULTS: VAS US DUPLEX DIALYSIS ACCESS (AVF, AVG)  Result Date: 10/24/2022 DIALYSIS ACCESS Patient Name:  [name]  Date of Exam:   10/24/2022 Medical Rec #: [id number]        Accession #:    [id number] Date of Birth: Jul 18, 1970       Patient Gender: M Patient Age:   63 years Exam Location:  [facility] Procedure:      VAS US DUPLEX DIALYSIS ACCESS (AVF, AVG) Referring Phys: Heath Lark --------------------------------------------------------------------------------  Reason for Exam: Infection of AV graft for dialysis. Access Site: Right Upper Extremity. Access Type: Forearm loop AVG. History: 02/20/21 Rt AVG creation. Comparison Study: no prior Performing Technologist: Argentina Ponder RVS  Examination Guidelines: A complete evaluation includes B-mode imaging, spectral Doppler, color Doppler, and power  Doppler as needed of all accessible portions of each vessel. Unilateral testing is considered an integral part of a  complete examination. Limited examinations for reoccurring indications may be performed as noted.  Findings:   +--------------------+----------+-----------------+--------+ AVG                 PSV (cm/s)Flow Vol (mL/min)Describe +--------------------+----------+-----------------+--------+ Native artery inflow   420          1731                +--------------------+----------+-----------------+--------+ Arterial anastomosis   494                              +--------------------+----------+-----------------+--------+ Prox graft             160                              +--------------------+----------+-----------------+--------+ Mid graft              191                              +--------------------+----------+-----------------+--------+ Distal graft           140                              +--------------------+----------+-----------------+--------+ Venous anastomosis     349                              +--------------------+----------+-----------------+--------+ Venous outflow         270                              +--------------------+----------+-----------------+--------+  Summary: Patent arteriovenous graft. *See table(s) above for measurements and observations.  Diagnosing physician: Heath Lark Electronically signed by Heath Lark on 10/24/2022 at 10:05:07 AM.    --------------------------------------------------------------------------------   Final      LOS: 3 days   Jeoffrey Massed, MD  [facility]    To contact the attending provider between 7A-7P or the covering provider during after hours 7P-7A, please log into the web site [email or web address] and access using universal [place] password for that web site. If you do not have the password, please call the hospital operator.  10/24/2022, 11:19 AM

## 2022-10-25 ENCOUNTER — Inpatient Hospital Stay (HOSPITAL_COMMUNITY): Payer: Medicaid Other

## 2022-10-25 ENCOUNTER — Encounter (HOSPITAL_COMMUNITY): Payer: Self-pay | Admitting: Internal Medicine

## 2022-10-25 ENCOUNTER — Inpatient Hospital Stay (HOSPITAL_COMMUNITY): Payer: Medicaid Other | Admitting: Certified Registered"

## 2022-10-25 ENCOUNTER — Encounter (HOSPITAL_COMMUNITY)
Admission: EM | Disposition: A | Discharge status: Skilled Nursing Facility | Payer: Self-pay | Source: Home / Self Care | Attending: Internal Medicine

## 2022-10-25 DIAGNOSIS — I361 Nonrheumatic tricuspid (valve) insufficiency: Secondary | ICD-10-CM

## 2022-10-25 DIAGNOSIS — E119 Type 2 diabetes mellitus without complications: Secondary | ICD-10-CM

## 2022-10-25 DIAGNOSIS — R7881 Bacteremia: Secondary | ICD-10-CM

## 2022-10-25 DIAGNOSIS — N186 End stage renal disease: Secondary | ICD-10-CM | POA: Diagnosis not present

## 2022-10-25 DIAGNOSIS — W19XXXA Unspecified fall, initial encounter: Secondary | ICD-10-CM | POA: Diagnosis not present

## 2022-10-25 DIAGNOSIS — Z794 Long term (current) use of insulin: Secondary | ICD-10-CM

## 2022-10-25 DIAGNOSIS — I110 Hypertensive heart disease with heart failure: Secondary | ICD-10-CM

## 2022-10-25 DIAGNOSIS — I509 Heart failure, unspecified: Secondary | ICD-10-CM

## 2022-10-25 HISTORY — PX: TEE WITHOUT CARDIOVERSION: SHX5443

## 2022-10-25 LAB — ECHO TEE

## 2022-10-25 LAB — GLUCOSE, CAPILLARY
Glucose-Capillary: 129 mg/dL — ABNORMAL HIGH (ref 70–99)
Glucose-Capillary: 118 mg/dL — ABNORMAL HIGH (ref 70–99)
Glucose-Capillary: 176 mg/dL — ABNORMAL HIGH (ref 70–99)
Glucose-Capillary: 272 mg/dL — ABNORMAL HIGH (ref 70–99)

## 2022-10-25 LAB — CULTURE, BLOOD (ROUTINE X 2)

## 2022-10-25 SURGERY — ECHOCARDIOGRAM, TRANSESOPHAGEAL
Anesthesia: Monitor Anesthesia Care

## 2022-10-25 MED ORDER — LIDOCAINE-PRILOCAINE 2.5-2.5 % EX CREA: 1.0000 | TOPICAL_CREAM | CUTANEOUS | Status: DC | PRN

## 2022-10-25 MED ORDER — PROPOFOL 500 MG/50ML IV EMUL
INTRAVENOUS | Status: DC | PRN
  Administered 2022-10-25: 20 mg via INTRAVENOUS
  Administered 2022-10-25: 85 ug/kg/min via INTRAVENOUS
  Administered 2022-10-25: 20 mg via INTRAVENOUS

## 2022-10-25 MED ORDER — HEPARIN SODIUM (PORCINE) 1000 UNIT/ML DIALYSIS: 1000.0000 [IU] | INTRAMUSCULAR | Status: DC | PRN

## 2022-10-25 MED ORDER — PENTAFLUOROPROP-TETRAFLUOROETH EX AERO: 1.0000 | INHALATION_SPRAY | CUTANEOUS | Status: DC | PRN

## 2022-10-25 MED ORDER — SODIUM CHLORIDE 0.9 % IV SOLN: INTRAVENOUS | Status: DC

## 2022-10-25 MED ORDER — LIDOCAINE HCL (PF) 1 % IJ SOLN: 5.0000 mL | INTRAMUSCULAR | Status: DC | PRN

## 2022-10-25 MED ORDER — ALTEPLASE 2 MG IJ SOLR: 2.0000 mg | Freq: Once | INTRAMUSCULAR | Status: DC | PRN

## 2022-10-25 MED ORDER — VANCOMYCIN IV (FOR PTA / DISCHARGE USE ONLY)
750.0000 mg | INTRAVENOUS | 0 refills | Status: AC
Start: 2022-10-26 — End: 2022-11-27

## 2022-10-25 NOTE — Progress Notes (Signed)
PROGRESS NOTE        PATIENT DETAILS Name: [name] Age: 52 y.o. Sex: male Date of Birth: 1971/01/03 Admit Date: 10/20/2022 Admitting Physician Starleen Arms, MD GEX:[id number], [facility]  Brief Summary: Patient is a 52 y.o.  male with tree of ESRD on HD TTS, HTN, DM-2-who was recently admitted to numerous hospitals in [place] in the setting of MRSA bacteremia/AV graft infection-he unfortunately left AMA from the hospital [place] before he finished completing treatment-presented to the ED following a mechanical fall.    Significant events: 6/2>> admit to [facility]  Significant studies: 6/2>> x-ray right elbow: No fracture. 6/2>> x-ray right knee: No fracture 6/2>> CXR: No active disease. 6/4>> echo: EF 20-25%, consider evaluation for infiltrative cardiomyopathy 6 6>> Doppler of AV graft: Patent AV graft-no fluid collection seen.  Significant microbiology data: 6/2>> blood culture: No growth  Procedures: None  Consults: ID Nephrology  Subjective: Sleeping comfortably-no major issues overnight.  No chest pain or shortness of breath.  Understands that he is scheduled for TEE later today.  Objective: Vitals: Blood pressure 139/75, pulse 75, temperature 99.3 F (37.4 C), resp. rate (!) 8, height 5\' 7"  (1.702 m), weight 72.6 kg, SpO2 99 %.   Exam: Gen Exam:Alert awake-not in any distress HEENT:atraumatic, normocephalic Chest: B/L clear to auscultation anteriorly CVS:S1S2 regular Abdomen:soft non tender, non distended Extremities:no edema Neurology: Non focal Skin: no rash  Pertinent Labs/Radiology:    Latest Ref Rng & Units 10/24/2022    7:40 AM 10/21/2022    5:49 AM 10/20/2022    5:08 PM  CBC  WBC 4.0 - 10.5 K/uL 8.1  7.9  8.5   Hemoglobin 13.0 - 17.0 g/dL 7.7  7.9  8.4   Hematocrit 39.0 - 52.0 % 25.4  25.6  28.7   Platelets 150 - 400 K/uL 407  435  454     Lab Results  Component Value Date   NA 134 (L) 10/24/2022   K 5.0 10/24/2022    CL 96 (L) 10/24/2022   CO2 22 10/24/2022      Assessment/Plan: 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  ESRD on HD TTS Nephrology following and directing care See above regarding concern for infected AV graft.  Acute on chronic systolic/diastolic heart failure Volume removal with HD Per cardiology-no concern for infiltrative cardiomyopathy Continue Coreg/hydralazine/Imdur Per cardiology-if compliant with medications-repeat echocardiogram in several months to recheck EF  HTN Previously noncompliant-now with significant cardiac issues-extensive counseling-he seems to be very motivated. BP fluctuating but overall better-continue Imdur/hydralazine/Coreg  DM-2 (A1c 7.7 on 4/21) CBG stable Continue Semglee 7 units daily and SSI.  Recent Labs    10/24/22 0909 10/24/22 1146 10/25/22 0920  GLUCAP 186* 143* 129*     Normocytic anemia Secondary to ESRD Aranesp/iron per nephrology service  GERD PPI  Mechanical fall  Recurrent issue PT/OT eval-SNF recommended  BMI: Estimated body mass index is 25.06 kg/m as calculated from the following:   Height as of this encounter: 5\' 7"  (1.702 m).   Weight as of this encounter: 72.6 kg.   Code status:   Code Status: Full Code   DVT Prophylaxis: heparin injection 5,000 Units Start: 10/21/22 1400 SCDs Start: 10/20/22 1927    Family Communication:  None at bedside   Disposition Plan: Status is: Inpatient Remains inpatient appropriate because: Severity of illness   Planned Discharge Destination:Skilled nursing facility   Diet: Diet Order             Diet NPO time specified  Diet effective midnight                     Antimicrobial agents: Anti-infectives (From admission, onward)    Start     Dose/Rate Route Frequency Ordered  Stop   10/26/22 1200  [MAR Hold]  vancomycin (VANCOREADY) IVPB 750 mg/150 mL        (MAR Hold since Fri 10/25/2022 at 0900.Hold Reason: Transfer to a Procedural area)   750 mg 150 mL/hr over 60 Minutes Intravenous Every T-Th-Sa (Hemodialysis) 10/24/22 1016     10/24/22 1815  vancomycin (VANCOREADY) IVPB 750 mg/150 mL        750 mg 150 mL/hr over 60 Minutes Intravenous  Once 10/24/22 1813 10/25/22 0746   10/22/22 1200  vancomycin (VANCOREADY) IVPB 750 mg/150 mL  Status:  Discontinued        750 mg 150 mL/hr over 60 Minutes Intravenous Every T-Th-Sa (Hemodialysis) 10/20/22 2111 10/24/22 1016   10/21/22 1800  vancomycin (VANCOREADY) IVPB 750 mg/150 mL        750 mg 150 mL/hr over 60 Minutes Intravenous  Once 10/21/22 1327 10/21/22 1712   10/21/22 1200  vancomycin (VANCOREADY) IVPB 750 mg/150 mL  Status:  Discontinued        750 mg 150 mL/hr over 60 Minutes Intravenous To Hemodialysis 10/20/22 2111 10/21/22 1327        MEDICATIONS: Scheduled Meds:  [MAR Hold] calcitRIOL  0.25 mcg Oral Q T,Th,Sat-1800   [MAR Hold] calcium acetate  2,001 mg Oral TID WC   [MAR Hold] carvedilol  25 mg Oral BID WC   [MAR Hold] Chlorhexidine Gluconate Cloth  6 each Topical Q0600   [MAR Hold] heparin injection (subcutaneous)  5,000 Units Subcutaneous Q8H   [MAR Hold] hydrALAZINE  100 mg Oral Q8H   [MAR Hold] insulin aspart  0-6 Units Subcutaneous TID WC   [MAR Hold] insulin glargine-yfgn  7 Units Subcutaneous QHS   [MAR Hold] isosorbide mononitrate  60 mg Oral Daily   [MAR Hold] pantoprazole  40 mg Oral Daily   Continuous Infusions:  sodium chloride Stopped (10/25/22 1009)   [MAR Hold] vancomycin     PRN Meds:.[MAR Hold] acetaminophen **OR** [MAR Hold] acetaminophen, [MAR Hold] hydrALAZINE, [MAR Hold] melatonin, [MAR Hold] ondansetron (ZOFRAN) IV, [MAR Hold] oxyCODONE-acetaminophen   I have personally reviewed following labs and imaging studies  LABORATORY DATA: CBC: Recent Labs  Lab 10/20/22 1708  10/21/22 0549 10/24/22 0740  WBC 8.5 7.9 8.1  NEUTROABS 6.1 5.6  --   HGB 8.4* 7.9* 7.7*  HCT 28.7* 25.6* 25.4*  MCV 90.8 88.3 88.2  PLT 454* 435* 407*     Basic Metabolic Panel: Recent Labs  Lab 10/20/22 1708 10/21/22 0549 10/24/22 0740  NA 136 138 134*  K 5.0 4.7 5.0  CL 97* 102 96*  CO2 21* 22 22  GLUCOSE 151* 210* 135*  BUN 60* 65* 55*  CREATININE 9.49* 10.26* 9.47*  CALCIUM 8.1* 7.7* 8.2*  MG 2.6* 2.5*  --   PHOS  --  6.2* 5.1*     GFR: Estimated Creatinine Clearance: 8.8 mL/min (A) (by C-G formula based on SCr of 9.47 mg/dL (H)).  Liver Function Tests: Recent Labs  Lab 10/21/22 0549 10/24/22  0740  AST 22  --   ALT 27  --   ALKPHOS 223*  --   BILITOT 0.5  --   PROT 7.0  --   ALBUMIN 2.7* 2.8*    No results for input(s): "LIPASE", "AMYLASE" in the last 168 hours. No results for input(s): "AMMONIA" in the last 168 hours.  Coagulation Profile: Recent Labs  Lab 10/20/22 2148  INR 1.3*     Cardiac Enzymes: No results for input(s): "CKTOTAL", "CKMB", "CKMBINDEX", "TROPONINI" in the last 168 hours.  BNP (last 3 results) No results for input(s): "PROBNP" in the last 8760 hours.  Lipid Profile: No results for input(s): "CHOL", "HDL", "LDLCALC", "TRIG", "CHOLHDL", "LDLDIRECT" in the last 72 hours.  Thyroid Function Tests: No results for input(s): "TSH", "T4TOTAL", "FREET4", "T3FREE", "THYROIDAB" in the last 72 hours.  Anemia Panel: No results for input(s): "VITAMINB12", "FOLATE", "FERRITIN", "TIBC", "IRON", "RETICCTPCT" in the last 72 hours.   Urine analysis:    Component Value Date/Time   COLORURINE YELLOW 02/19/2021 0045   APPEARANCEUR HAZY (A) 02/19/2021 0045   APPEARANCEUR Clear 05/09/2020 1509   LABSPEC 1.013 02/19/2021 0045   PHURINE 5.0 02/19/2021 0045   GLUCOSEU >=500 (A) 02/19/2021 0045   HGBUR MODERATE (A) 02/19/2021 0045   BILIRUBINUR NEGATIVE 02/19/2021 0045   BILIRUBINUR Negative 05/09/2020 1509   KETONESUR NEGATIVE  02/19/2021 0045   PROTEINUR >=300 (A) 02/19/2021 0045   UROBILINOGEN 0.2 02/04/2020 1612   UROBILINOGEN 0.2 02/16/2014 0045   NITRITE NEGATIVE 02/19/2021 0045   LEUKOCYTESUR NEGATIVE 02/19/2021 0045    Sepsis Labs: Lactic Acid, Venous    Component Value Date/Time   LATICACIDVEN 1.4 10/20/2022 1708    MICROBIOLOGY: Recent Results (from the past 240 hour(s))  Blood culture (routine x 2)     Status: None (Preliminary result)   Collection Time: 10/20/22  6:43 PM   Specimen: BLOOD LEFT ARM  Result Value Ref Range Status   Specimen Description BLOOD LEFT ARM  Final   Special Requests   Final    BOTTLES DRAWN AEROBIC AND ANAEROBIC Blood Culture results may not be optimal due to an inadequate volume of blood received in culture bottles   Culture   Final    NO GROWTH 4 DAYS Performed at [facility], [address] [place], [place] [address]    Report Status PENDING  Incomplete  Blood culture (routine x 2)     Status: None (Preliminary result)   Collection Time: 10/20/22  9:47 PM   Specimen: BLOOD LEFT HAND  Result Value Ref Range Status   Specimen Description BLOOD LEFT HAND  Final   Special Requests   Final    BOTTLES DRAWN AEROBIC AND ANAEROBIC Blood Culture adequate volume   Culture   Final    NO GROWTH 4 DAYS Performed at [facility], [address], [place], [place] [address]    Report Status PENDING  Incomplete    RADIOLOGY STUDIES/RESULTS: EP STUDY  Result Date: 10/25/2022 See surgical note for result.  VAS US DUPLEX DIALYSIS ACCESS (AVF, AVG)  Result Date: 10/24/2022 DIALYSIS ACCESS Patient Name:  KACIN CESARE  Date of Exam:   10/24/2022 Medical Rec #: [id number]        Accession #:    [id number] Date of Birth: 02-Apr-1971       Patient Gender: M Patient Age:   67 years Exam Location:  [facility] Procedure:      VAS US DUPLEX DIALYSIS ACCESS (AVF, AVG) Referring  Phys: Heath Lark  --------------------------------------------------------------------------------  Reason for Exam: Infection of AV graft for dialysis. Access Site: Right Upper Extremity. Access Type: Forearm loop AVG. History: 02/20/21 Rt AVG creation. Comparison Study: no prior Performing Technologist: Argentina Ponder RVS  Examination Guidelines: A complete evaluation includes B-mode imaging, spectral Doppler, color Doppler, and power Doppler as needed of all accessible portions of each vessel. Unilateral testing is considered an integral part of a complete examination. Limited examinations for reoccurring indications may be performed as noted.  Findings:   +--------------------+----------+-----------------+--------+ AVG                 PSV (cm/s)Flow Vol (mL/min)Describe +--------------------+----------+-----------------+--------+ Native artery inflow   420          1731                +--------------------+----------+-----------------+--------+ Arterial anastomosis   494                              +--------------------+----------+-----------------+--------+ Prox graft             160                              +--------------------+----------+-----------------+--------+ Mid graft              191                              +--------------------+----------+-----------------+--------+ Distal graft           140                              +--------------------+----------+-----------------+--------+ Venous anastomosis     349                              +--------------------+----------+-----------------+--------+ Venous outflow         270                              +--------------------+----------+-----------------+--------+  Summary: Patent arteriovenous graft. *See table(s) above for measurements and observations.  Diagnosing physician: Heath Lark Electronically signed by Heath Lark on 10/24/2022 at 10:05:07 AM.     --------------------------------------------------------------------------------   Final      LOS: 4 days   Jeoffrey Massed, MD  [facility]    To contact the attending provider between 7A-7P or the covering provider during after hours 7P-7A, please log into the web site [email or web address] and access using universal [place] password for that web site. If you do not have the password, please call the hospital operator.  10/25/2022, 10:35 AM

## 2022-10-25 NOTE — H&P (Signed)
[name] is a 52 y.o. male who has presented today for surgery, with the diagnosis of bacteremia.  The various methods of treatment have been discussed with the patient and family. After consideration of risks, benefits and other options for treatment, the patient has consented to  Procedure(s): TRANSESOPHAGEAL ECHOCARDIOGRAM (TEE) (N/A) as a surgical intervention .  The patient's history has been reviewed, patient examined, no change in status, stable for surgery.  I have reviewed the patient's chart and labs.  Questions were answered to the patient's satisfaction.    Eliu Batch C. Duke Salvia, MD, [facility]  10/25/2022 10:11 AM

## 2022-10-25 NOTE — Progress Notes (Addendum)
[facility] Progress Note   Subjective:    Patient completed TEE this morning and now returned to his room. Seen and examined patient at bedside. No acute complaints. He received HD overnight and noted net UF 2.5L. Next HD 6/8.  Objective Vitals:   10/25/22 1000 10/25/22 1020 10/25/22 1029 10/25/22 1035  BP: (!) 158/72 138/72 139/75 (!) 153/77  Pulse: 76 76 75 76  Resp:   14 12  Temp: 99.3 F (37.4 C)     TempSrc:      SpO2: 99% 98% 99% 98%  Weight:      Height:       Physical Exam General: Awake, alert, 2L [place] (on and off), NAD Heart: S1 and S2; No murmurs, gallops, or rubs Lungs: Clear anteriorly Abdomen: Soft and non-tender Extremities:No LE edema Dialysis Access: R AVG-dressing CDI, (+) B/T   Filed Weights   10/24/22 0300 10/24/22 1544 10/24/22 2059  Weight: 75.5 kg 74.3 kg 72.6 kg    Intake/Output Summary (Last 24 hours) at 10/25/2022 1105 Last data filed at 10/25/2022 1009 Gross per 24 hour  Intake 300 ml  Output 2500 ml  Net -2200 ml    Additional Objective Labs: Basic Metabolic Panel: Recent Labs  Lab 10/20/22 1708 10/21/22 0549 10/24/22 0740  NA 136 138 134*  K 5.0 4.7 5.0  CL 97* 102 96*  CO2 21* 22 22  GLUCOSE 151* 210* 135*  BUN 60* 65* 55*  CREATININE 9.49* 10.26* 9.47*  CALCIUM 8.1* 7.7* 8.2*  PHOS  --  6.2* 5.1*   Liver Function Tests: Recent Labs  Lab 10/21/22 0549 10/24/22 0740  AST 22  --   ALT 27  --   ALKPHOS 223*  --   BILITOT 0.5  --   PROT 7.0  --   ALBUMIN 2.7* 2.8*   No results for input(s): "LIPASE", "AMYLASE" in the last 168 hours. CBC: Recent Labs  Lab 10/20/22 1708 10/21/22 0549 10/24/22 0740  WBC 8.5 7.9 8.1  NEUTROABS 6.1 5.6  --   HGB 8.4* 7.9* 7.7*  HCT 28.7* 25.6* 25.4*  MCV 90.8 88.3 88.2  PLT 454* 435* 407*   Blood Culture    Component Value Date/Time   SDES BLOOD LEFT HAND 10/20/2022 2147   SPECREQUEST  10/20/2022 2147    BOTTLES DRAWN AEROBIC AND ANAEROBIC Blood Culture adequate  volume   CULT  10/20/2022 2147    NO GROWTH 4 DAYS Performed at [facility], [address] [place], [place] [address]    REPTSTATUS PENDING 10/20/2022 2147    Cardiac Enzymes: No results for input(s): "CKTOTAL", "CKMB", "CKMBINDEX", "TROPONINI" in the last 168 hours. CBG: Recent Labs  Lab 10/23/22 2032 10/24/22 0909 10/24/22 1146 10/25/22 0920 10/25/22 1054  GLUCAP 157* 186* 143* 129* 118*   Iron Studies: No results for input(s): "IRON", "TIBC", "TRANSFERRIN", "FERRITIN" in the last 72 hours. Lab Results  Component Value Date   INR 1.3 (H) 10/20/2022   INR 1.3 (H) 09/08/2022   INR 1.3 (H) 09/07/2022   Studies/Results: EP STUDY  Result Date: 10/25/2022 See surgical note for result.  VAS US DUPLEX DIALYSIS ACCESS (AVF, AVG)  Result Date: 10/24/2022 DIALYSIS ACCESS Patient Name:  [name]  Date of Exam:   10/24/2022 Medical Rec #: [id number]        Accession #:    [id number] Date of Birth: 1970/07/28       Patient Gender: M Patient Age:   52 years Exam Location:  [facility] Procedure:      VAS US DUPLEX DIALYSIS ACCESS (AVF, AVG) Referring Phys: Heath Lark --------------------------------------------------------------------------------  Reason for Exam: Infection of AV graft for dialysis. Access Site: Right Upper Extremity. Access Type: Forearm loop AVG. History: 02/20/21 Rt AVG creation. Comparison Study: no prior Performing Technologist: Argentina Ponder RVS  Examination Guidelines: A complete evaluation includes B-mode imaging, spectral Doppler, color Doppler, and power Doppler as needed of all accessible portions of each vessel. Unilateral testing is considered an integral part of a complete examination. Limited examinations for reoccurring indications may be performed as noted.  Findings:   +--------------------+----------+-----------------+--------+ AVG                 PSV (cm/s)Flow Vol (mL/min)Describe  +--------------------+----------+-----------------+--------+ Native artery inflow   420          1731                +--------------------+----------+-----------------+--------+ Arterial anastomosis   494                              +--------------------+----------+-----------------+--------+ Prox graft             160                              +--------------------+----------+-----------------+--------+ Mid graft              191                              +--------------------+----------+-----------------+--------+ Distal graft           140                              +--------------------+----------+-----------------+--------+ Venous anastomosis     349                              +--------------------+----------+-----------------+--------+ Venous outflow         270                              +--------------------+----------+-----------------+--------+  Summary: Patent arteriovenous graft. *See table(s) above for measurements and observations.  Diagnosing physician: Heath Lark Electronically signed by Heath Lark on 10/24/2022 at 10:05:07 AM.    --------------------------------------------------------------------------------   Final     Medications:  [START ON 10/26/2022] vancomycin      calcitRIOL  0.25 mcg Oral Q T,Th,Sat-1800   calcium acetate  2,001 mg Oral TID WC   carvedilol  25 mg Oral BID WC   Chlorhexidine Gluconate Cloth  6 each Topical Q0600   heparin injection (subcutaneous)  5,000 Units Subcutaneous Q8H   hydrALAZINE  100 mg Oral Q8H   insulin aspart  0-6 Units Subcutaneous TID WC   insulin glargine-yfgn  7 Units Subcutaneous QHS   isosorbide mononitrate  60 mg Oral Daily   pantoprazole  40 mg Oral Daily    Dialysis Orders: [facility] TTS 4h  400/ 800   61.5kg  2/2.5 bath  RFA AVG   - Heparin 2000 units IV TIW - last HD in [place] recorded on 09/06/22, post wt 74.3kg (dry wt was 61.5 back then too) - rocaltrol 0.25 mcg po tiw -  mircera 100  mcg IV q 2 wks, last 09/06/22  Assessment/Plan: MRSA bacteremia - at OSH in [place] in May 2024, pt was rx'd w/ IV vanc but left after his course could be completed. Likely his last dose was about 4-5 days ago. Repeat blood cx's from 6/2 NGTD. Pharmacy on board. On IV vanc here. At discharge, plan to continue IV Vancomycin 750mg  with HD until 11/26/22. Per pmd.  ID following.  SP declot R forearm AVG - possibly done no 5/03, done out of town also. Good bruit here today.  Complicated by bacteremia.  VVS did evaluate AVG and does not feel this is the source of infection as he has an abscess on his left arm and nasal septum.   ESRD - on HD TTS. Next HD 6/8. Still has chair at [facility] T,Th,S.  HTN/ volume - volume overload, which is chronic for him. He usually runs 6-10 kg over his dry wt. Max UF w/ HD 10/21/2022. Net UF 3.8 liters. Still very much over OP EDW. Continue to lower volume.  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  Rec'd Aranesp 150 MCG 10/21/2022. No Fe while on ABXs. MBD ckd - Ca in range. Phos at goal at 5.1. Resumed rocaltrol po tiw. Pth 167.  Homelessness - social issues. Interested in SNF placement  Salome Holmes, NP [facility] 10/25/2022,11:05 AM  LOS: 4 days

## 2022-10-25 NOTE — TOC Progression Note (Signed)
Transition of Care [facility]) - Progression Note    Patient Details  Name: [name] MRN: [id number] Date of Birth: 06/16/1970  [facility] CM/SW Contact  Mearl Latin, LCSW Phone Number: 10/25/2022, 2:46 PM  Clinical Narrative:    12pm-CSW spoke with patient and he is in agreement with [facility] SNF requested patient sign release forms which patient signed and CSW emailed securely back to Tammy with [facility]. CSW explained that per SNF, patient will need to stay at SNF 30 days for insurance to pay. CSW also explained process of converting to long term care after the 30 days, which patient stated he will consider. He confirmed he does receive disability.      Expected Discharge Plan: Skilled Nursing Facility Barriers to Discharge: Continued Medical Work up  Expected Discharge Plan and Services In-house Referral: Clinical Social Work   Post Acute Care Choice: Skilled Nursing Facility Living arrangements for the past 2 months: Homeless                                       Social Determinants of Health (SDOH) Interventions SDOH Screenings   Food Insecurity: Food Insecurity Present (05/08/2022)  Housing: High Risk (10/21/2022)  Transportation Needs: Unmet Transportation Needs (10/21/2022)  Utilities: Not At Risk (10/21/2022)  Depression (PHQ2-9): Low Risk  (10/13/2019)  Financial Resource Strain: High Risk (05/08/2022)  Tobacco Use: Low Risk  (10/25/2022)    Readmission Risk Interventions    10/22/2022    2:17 PM 11/07/2021    4:21 PM 02/19/2021    3:52 PM  Readmission Risk Prevention Plan  Transportation Screening Complete Complete Complete  Medication Review Oceanographer) Complete Complete Complete  PCP or Specialist appointment within 3-5 days of discharge Complete Complete Complete  HRI or Home Care Consult Complete Complete Complete  SW Recovery Care/Counseling Consult Complete Complete Complete  Palliative Care Screening Not  Applicable Not Applicable Not Applicable  Skilled Nursing Facility Complete Not Applicable Not Applicable

## 2022-10-25 NOTE — Anesthesia Preprocedure Evaluation (Addendum)
Anesthesia Evaluation  Patient identified by MRN, date of birth, ID band Patient awake    Reviewed: Allergy & Precautions, H&P , NPO status , Patient's Chart, lab work & pertinent test results, reviewed documented beta blocker date and time   Airway Mallampati: III  TM Distance: >3 FB Neck ROM: Full    Dental no notable dental hx. (+) Teeth Intact, Dental Advisory Given   Pulmonary shortness of breath   Pulmonary exam normal breath sounds clear to auscultation       Cardiovascular hypertension, Pt. on medications and Pt. on home beta blockers +CHF  + dysrhythmias  Rhythm:Regular Rate:Normal     Neuro/Psych   Anxiety     negative neurological ROS     GI/Hepatic Neg liver ROS,GERD  Medicated,,  Endo/Other  diabetes, Insulin Dependent    Renal/GU Renal disease  negative genitourinary   Musculoskeletal   Abdominal   Peds  Hematology  (+) Blood dyscrasia, anemia   Anesthesia Other Findings   Reproductive/Obstetrics negative OB ROS                             Anesthesia Physical Anesthesia Plan  ASA: 4  Anesthesia Plan: MAC   Post-op Pain Management: Minimal or no pain anticipated   Induction: Intravenous  PONV Risk Score and Plan: 1 and Propofol infusion  Airway Management Planned: Natural Airway and Nasal Cannula  Additional Equipment:   Intra-op Plan:   Post-operative Plan:   Informed Consent: I have reviewed the patients History and Physical, chart, labs and discussed the procedure including the risks, benefits and alternatives for the proposed anesthesia with the patient or authorized representative who has indicated his/her understanding and acceptance.     Dental advisory given  Plan Discussed with: CRNA  Anesthesia Plan Comments:        Anesthesia Quick Evaluation

## 2022-10-25 NOTE — Anesthesia Postprocedure Evaluation (Signed)
Anesthesia Post Note  Patient: [name]  Procedure(s) Performed: TRANSESOPHAGEAL ECHOCARDIOGRAM     Patient location during evaluation: Cath Lab Anesthesia Type: MAC Level of consciousness: awake and alert Pain management: pain level controlled Vital Signs Assessment: post-procedure vital signs reviewed and stable Respiratory status: spontaneous breathing, nonlabored ventilation and respiratory function stable Cardiovascular status: stable and blood pressure returned to baseline Postop Assessment: no apparent nausea or vomiting Anesthetic complications: no  No notable events documented.  Last Vitals:  Vitals:   10/25/22 1029 10/25/22 1035  BP: 139/75 (!) 153/77  Pulse: 75 76  Resp: 14 12  Temp:  37.2 C  SpO2: 99% 98%    Last Pain:  Vitals:   10/25/22 1035  TempSrc: Oral  PainSc:                  [name],W. EDMOND

## 2022-10-25 NOTE — Progress Notes (Addendum)
PHARMACY CONSULT NOTE FOR:  OUTPATIENT  PARENTERAL ANTIBIOTIC THERAPY (OPAT)  Informational as the patient will receive antibiotics outpatient at his HD center. Plan communicated to the nephrology team.  Indication: MRSA bacteremia Regimen: Vancomycin 750 mg/HD-TTS End date: 11/26/22 (6 weeks from neg BCx 10/16/22 at OSH)  IV antibiotic discharge orders are pended. To discharging provider:  please sign these orders via discharge navigator,  Select New Orders & click on the button choice - Manage This Unsigned Work.     Thank you for allowing pharmacy to be a part of this patient's care.  Georgina Pillion, PharmD, BCPS Infectious Diseases Clinical Pharmacist 10/25/2022 11:09 AM   **Pharmacist phone directory can now be found on [email or web address] ([email or web address]).  Listed under [facility].

## 2022-10-25 NOTE — CV Procedure (Signed)
Brief TEE Note  LVEF 20-25%.  Global hypokinesis. Trivial AR, trivial PR, trivial MR Moderate TR Pericardial effusion with no evidence of tamponade No LA/LAA thrombus or masses.  No endocarditis.  For additional details see full report.  Kambria Grima C. Duke Salvia, MD, [facility] 10/25/2022 10:10 AM

## 2022-10-25 NOTE — Transfer of Care (Signed)
Immediate Anesthesia Transfer of Care Note  Patient: [name]  Procedure(s) Performed: TRANSESOPHAGEAL ECHOCARDIOGRAM  Patient Location: Cath Lab  Anesthesia Type:MAC  Level of Consciousness: drowsy and patient cooperative  Airway & Oxygen Therapy: Patient Spontanous Breathing and Patient connected to face mask oxygen  Post-op Assessment: Report given to RN and Post -op Vital signs reviewed and stable  Post vital signs: Reviewed and stable  Last Vitals:  Vitals Value Taken Time  BP    Temp    Pulse    Resp    SpO2      Last Pain:  Vitals:   10/25/22 0905  TempSrc:   PainSc: 0-No pain         Complications: No notable events documented.

## 2022-10-25 NOTE — Progress Notes (Signed)
      Date: 10/25/2022  Patient name: [name]  Medical record number: [id number]  Date of birth: 1970-11-12   This patient has been seen and discussed with the house staff. Please see the resident's note for complete details. I have reviewed the pertinent HPI, Past medical, surgical, family, social history, ROS, allergies, medications, pertinent laboratory and radiographic data and examined the patient independently.  I concur with their findings with the following additions/corrections:  Patients TEE is negative  We will plan on IV vancomycin with HD thru 7/10 with last post HD dose of vancomycin given on 7/9   [name] has an appointment on 11/18/2022 at [facility] with Dr. Elinor Parkinson at  [facility], which  is located in the [facility] at  [address] in [place].  [address], which is located to the left of the elevators.  Phone: [phone]  Fax: [phone]  [email or web address]  The patient should arrive 30 minutes prior to their appoitment.  I will sign off for now please call with further questions.   Acey Lav 10/25/2022, 11:29 AM

## 2022-10-25 NOTE — Progress Notes (Signed)
Contacted [facility] to advise clinic that pt may d/c to snf this weekend per CSW if insurance Berkley Harvey is received for snf. Pt will need iv abx with HD per pharmacist and renal NP. Will assist as needed.   Olivia Canter Renal Navigator [phone]

## 2022-10-26 DIAGNOSIS — Z7401 Bed confinement status: Secondary | ICD-10-CM | POA: Diagnosis not present

## 2022-10-26 DIAGNOSIS — E119 Type 2 diabetes mellitus without complications: Secondary | ICD-10-CM | POA: Diagnosis not present

## 2022-10-26 DIAGNOSIS — W19XXXA Unspecified fall, initial encounter: Secondary | ICD-10-CM | POA: Diagnosis not present

## 2022-10-26 DIAGNOSIS — I5042 Chronic combined systolic (congestive) and diastolic (congestive) heart failure: Secondary | ICD-10-CM | POA: Diagnosis not present

## 2022-10-26 DIAGNOSIS — R531 Weakness: Secondary | ICD-10-CM | POA: Diagnosis not present

## 2022-10-26 DIAGNOSIS — T827XXD Infection and inflammatory reaction due to other cardiac and vascular devices, implants and grafts, subsequent encounter: Secondary | ICD-10-CM | POA: Diagnosis not present

## 2022-10-26 DIAGNOSIS — N186 End stage renal disease: Secondary | ICD-10-CM | POA: Diagnosis not present

## 2022-10-26 DIAGNOSIS — I10 Essential (primary) hypertension: Secondary | ICD-10-CM | POA: Diagnosis not present

## 2022-10-26 DIAGNOSIS — R7881 Bacteremia: Secondary | ICD-10-CM | POA: Diagnosis not present

## 2022-10-26 MED ORDER — ONDANSETRON HCL 4 MG/2ML IJ SOLN
INTRAMUSCULAR | Status: AC
  Filled 2022-10-26: qty 2

## 2022-10-26 MED ORDER — CALCITRIOL 0.25 MCG PO CAPS: 0.2500 ug | ORAL_CAPSULE | ORAL | Status: DC

## 2022-10-26 MED ORDER — MELATONIN 3 MG PO TABS: 3.0000 mg | ORAL_TABLET | Freq: Every evening | ORAL | 0 refills | Status: DC | PRN

## 2022-10-26 MED ORDER — ISOSORBIDE MONONITRATE ER 60 MG PO TB24: 60.0000 mg | ORAL_TABLET | Freq: Every day | ORAL | Status: DC

## 2022-10-26 MED ORDER — NOVOLOG FLEXPEN 100 UNIT/ML ~~LOC~~ SOPN: PEN_INJECTOR | SUBCUTANEOUS | 0 refills | Status: DC

## 2022-10-26 MED ORDER — OXYCODONE-ACETAMINOPHEN 5-325 MG PO TABS: 1.0000 | ORAL_TABLET | Freq: Two times a day (BID) | ORAL | 0 refills | Status: DC | PRN

## 2022-10-26 MED ORDER — ONDANSETRON HCL 4 MG/2ML IJ SOLN: 4.0000 mg | Freq: Once | INTRAMUSCULAR | Status: DC

## 2022-10-26 MED ORDER — CARVEDILOL 25 MG PO TABS: 25.0000 mg | ORAL_TABLET | Freq: Two times a day (BID) | ORAL | Status: DC

## 2022-10-26 MED ORDER — INSULIN GLARGINE-YFGN 100 UNIT/ML ~~LOC~~ SOLN: 7.0000 [IU] | Freq: Every day | SUBCUTANEOUS | 11 refills | Status: DC

## 2022-10-26 NOTE — Progress Notes (Signed)
[facility] Progress Note   Subjective:    Seen and examined patient on HD. Tolerating UFG 4L. BP is 164/82. Plan for discharge to SNF after treatment today.  Objective Vitals:   10/26/22 0000 10/26/22 0400 10/26/22 0821 10/26/22 0902  BP: (!) 159/78 (!) 162/72 (!) 180/106 127/65  Pulse: 86 80    Resp: 18 18 17 10   Temp: 98.6 F (37 C) 98.2 F (36.8 C) 98 F (36.7 C)   TempSrc: Oral Oral Axillary   SpO2: 96% 95% 98% 100%  Weight:      Height:       Physical Exam General: Awake, alert, 2L [place], NAD Heart: S1 and S2; No murmurs, gallops, or rubs Lungs: Clear bilaterally; No wheezing, rales, or rhonchi Abdomen: Soft and non-tender Extremities:No LE edema Dialysis Access: R AVG-dressing CDI, (+) B/T   Filed Weights   10/24/22 0300 10/24/22 1544 10/24/22 2059  Weight: 75.5 kg 74.3 kg 72.6 kg    Intake/Output Summary (Last 24 hours) at 10/26/2022 0939 Last data filed at 10/25/2022 2000 Gross per 24 hour  Intake 540 ml  Output --  Net 540 ml    Additional Objective Labs: Basic Metabolic Panel: Recent Labs  Lab 10/20/22 1708 10/21/22 0549 10/24/22 0740  NA 136 138 134*  K 5.0 4.7 5.0  CL 97* 102 96*  CO2 21* 22 22  GLUCOSE 151* 210* 135*  BUN 60* 65* 55*  CREATININE 9.49* 10.26* 9.47*  CALCIUM 8.1* 7.7* 8.2*  PHOS  --  6.2* 5.1*   Liver Function Tests: Recent Labs  Lab 10/21/22 0549 10/24/22 0740  AST 22  --   ALT 27  --   ALKPHOS 223*  --   BILITOT 0.5  --   PROT 7.0  --   ALBUMIN 2.7* 2.8*   No results for input(s): "LIPASE", "AMYLASE" in the last 168 hours. CBC: Recent Labs  Lab 10/20/22 1708 10/21/22 0549 10/24/22 0740  WBC 8.5 7.9 8.1  NEUTROABS 6.1 5.6  --   HGB 8.4* 7.9* 7.7*  HCT 28.7* 25.6* 25.4*  MCV 90.8 88.3 88.2  PLT 454* 435* 407*   Blood Culture    Component Value Date/Time   SDES BLOOD LEFT HAND 10/20/2022 2147   SPECREQUEST  10/20/2022 2147    BOTTLES DRAWN AEROBIC AND ANAEROBIC Blood Culture adequate  volume   CULT  10/20/2022 2147    NO GROWTH 5 DAYS Performed at [facility], [address], [place], [place] [address]    REPTSTATUS 10/25/2022 FINAL 10/20/2022 2147    Cardiac Enzymes: No results for input(s): "CKTOTAL", "CKMB", "CKMBINDEX", "TROPONINI" in the last 168 hours. CBG: Recent Labs  Lab 10/24/22 1146 10/25/22 0920 10/25/22 1054 10/25/22 1512 10/25/22 2108  GLUCAP 143* 129* 118* 176* 272*   Iron Studies: No results for input(s): "IRON", "TIBC", "TRANSFERRIN", "FERRITIN" in the last 72 hours. Lab Results  Component Value Date   INR 1.3 (H) 10/20/2022   INR 1.3 (H) 09/08/2022   INR 1.3 (H) 09/07/2022   Studies/Results: ECHO TEE  Result Date: 10/25/2022    TRANSESOPHOGEAL ECHO REPORT   Patient Name:   [name] Date of Exam: 10/25/2022 Medical Rec #:  [id number]       Height:       67.0 in Accession #:    [id number]      Weight:       160.0 lb Date of Birth:  [birth year]      BSA:  1.839 m Patient Age:    52 years        BP:           163/80 mmHg Patient Gender: M               HR:           80 bpm. Exam Location:  Inpatient Procedure: Transesophageal Echo, Cardiac Doppler and Color Doppler Indications:     Bacteremia  History:         Patient has prior history of Echocardiogram examinations, most                  recent 10/22/2022. Arrythmias:LBBB; Risk Factors:Diabetes and                  Hypertension.  Sonographer:     Darlys Gales Referring Phys:  [id number] Jonita Albee Diagnosing Phys: Chilton Si MD PROCEDURE: After discussion of the risks and benefits of a TEE, an informed consent was obtained from the patient. The transesophogeal probe was passed without difficulty through the esophogus of the patient. Sedation performed by different physician. The patient was monitored while under deep sedation. Anesthestetic sedation was provided intravenously by Anesthesiology: 130mg  of Propofol, 0mg  of Lidocaine. The patient's vital signs; including heart  rate, blood pressure, and oxygen saturation; remained stable throughout the procedure. The patient developed no complications during the procedure.  IMPRESSIONS  1. Left ventricular ejection fraction, by estimation, is 20 to 25%. The left ventricle has severely decreased function. The left ventricle demonstrates global hypokinesis.  2. Right ventricular systolic function is moderately reduced. The right ventricular size is mildly enlarged.  3. No left atrial/left atrial appendage thrombus was detected.  4. A small pericardial effusion is present. There is no evidence of cardiac tamponade.  5. The mitral valve is normal in structure. Trivial mitral valve regurgitation. No evidence of mitral stenosis.  6. Tricuspid valve regurgitation is mild to moderate.  7. The aortic valve is tricuspid. Aortic valve regurgitation is not visualized. No aortic stenosis is present.  8. The inferior vena cava is normal in size with greater than 50% respiratory variability, suggesting right atrial pressure of 3 mmHg. Conclusion(s)/Recommendation(s): No evidence of vegetation/infective endocarditis on this transesophageael echocardiogram. FINDINGS  Left Ventricle: Left ventricular ejection fraction, by estimation, is 20 to 25%. The left ventricle has severely decreased function. The left ventricle demonstrates global hypokinesis. The left ventricular internal cavity size was normal in size. There is no left ventricular hypertrophy. Right Ventricle: The right ventricular size is mildly enlarged. No increase in right ventricular wall thickness. Right ventricular systolic function is moderately reduced. Left Atrium: Left atrial size was normal in size. No left atrial/left atrial appendage thrombus was detected. Right Atrium: Right atrial size was normal in size. Pericardium: A small pericardial effusion is present. There is no evidence of cardiac tamponade. Mitral Valve: The mitral valve is normal in structure. Trivial mitral valve  regurgitation. No evidence of mitral valve stenosis. Tricuspid Valve: The tricuspid valve is normal in structure. Tricuspid valve regurgitation is mild to moderate. No evidence of tricuspid stenosis. Aortic Valve: The aortic valve is tricuspid. Aortic valve regurgitation is not visualized. No aortic stenosis is present. Pulmonic Valve: The pulmonic valve was normal in structure. Pulmonic valve regurgitation is trivial. No evidence of pulmonic stenosis. Aorta: The aortic root is normal in size and structure. Venous: The inferior vena cava is normal in size with greater than 50% respiratory variability, suggesting right atrial pressure of  3 mmHg. IAS/Shunts: No atrial level shunt detected by color flow Doppler. Chilton Si MD Electronically signed by Chilton Si MD Signature Date/Time: 10/25/2022/1:37:12 PM    Final    EP STUDY  Result Date: 10/25/2022 See surgical note for result.   Medications:  vancomycin      calcitRIOL  0.25 mcg Oral Q T,Th,Sat-1800   calcium acetate  2,001 mg Oral TID WC   carvedilol  25 mg Oral BID WC   Chlorhexidine Gluconate Cloth  6 each Topical Q0600   heparin injection (subcutaneous)  5,000 Units Subcutaneous Q8H   hydrALAZINE  100 mg Oral Q8H   insulin aspart  0-6 Units Subcutaneous TID WC   insulin glargine-yfgn  7 Units Subcutaneous QHS   isosorbide mononitrate  60 mg Oral Daily   pantoprazole  40 mg Oral Daily    Dialysis Orders: [facility] TTS 4h  400/ 800   61.5kg  2/2.5 bath  RFA AVG   - Heparin 2000 units IV TIW - last HD in [place] recorded on 09/06/22, post wt 74.3kg (dry wt was 61.5 back then too) - rocaltrol 0.25 mcg po tiw - mircera 100 mcg IV q 2 wks, last 09/06/22  Assessment/Plan: MRSA bacteremia - at OSH in [place] in May 2024, pt was rx'd w/ IV vanc but left after his course could be completed. Likely his last dose was about 4-5 days ago. Repeat blood cx's from 6/2 NGTD. Pharmacy on board. On IV vanc here. At discharge, plan to continue IV  Vancomycin 750mg  with HD until 11/26/22. Per pmd.  ID following.  SP declot R forearm AVG - possibly done no 5/03, done out of town also. Good bruit here today.  Complicated by bacteremia.  VVS did evaluate AVG and does not feel this is the source of infection as he has an abscess on his left arm and nasal septum.   ESRD - on HD TTS. On HD. Still has chair at [facility].  HTN/ volume - volume overload, which is chronic for him. He usually runs 6-10 kg over his dry wt. Max UF w/ HD 10/21/2022. Net UF 3.8 liters. Still very much over OP EDW. Continue to lower volume. UFG set for 4L today. 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  Rec'd Aranesp 150 MCG 10/21/2022. No Fe while on ABXs. MBD ckd - Ca in range. Phos at goal at 5.1. Resumed rocaltrol po tiw. Pth 167.  Homelessness - plan for discharge to SNF after HD today Dispo- Okay for discharge from renal standpoint  Salome Holmes, NP [facility] 10/26/2022,9:39 AM  LOS: 5 days

## 2022-10-26 NOTE — Progress Notes (Signed)
AVS instructions were reviewed with patient, all personal belongings were returned.   All questions answered.  

## 2022-10-26 NOTE — Discharge Summary (Addendum)
PATIENT DETAILS Name: [name] Age: 52 y.o. Sex: male Date of Birth: Jun 26, 1970 MRN: [id number]. Admitting Physician: Starleen Arms, MD NWG:[facility]  Admit Date: 10/20/2022 Discharge date: 10/26/2022  Recommendations for Outpatient Follow-up:  Follow up with PCP in 1-2 weeks Please obtain CMP/CBC in one week IV vancomycin with HD through 7/10 Please ensure follow-up with infectious disease clinic and with cardiology  Admitted From:  Home  Disposition: Skilled nursing facility   Discharge Condition: good  CODE STATUS:   Code Status: Full Code   Diet recommendation:  Diet Order             Diet - low sodium heart healthy           Diet regular Room service appropriate? Yes; Fluid consistency: Thin  Diet effective now                    Brief Summary: Patient is a 52 y.o.  male with tree of ESRD on HD TTS, HTN, DM-2-who was recently admitted to numerous hospitals in [place] in the setting of MRSA bacteremia/AV graft infection-he unfortunately left AMA from the hospital [place] before he finished completing treatment-presented to the ED following a mechanical fall.     Significant events: 6/2>> admit to [facility]   Significant studies: 6/2>> x-ray right elbow: No fracture. 6/2>> x-ray right knee: No fracture 6/2>> CXR: No active disease. 6/4>> echo: EF 20-25%, consider evaluation for infiltrative cardiomyopathy 6 6>> Doppler of AV graft: Patent AV graft-no fluid collection seen. 6/7>> TEE no endocarditis   Significant microbiology data: 6/2>> blood culture: No growth   Procedures: None   Consults: ID Nephrology Cardiology    Brief Hospital Course: MRSA bacteremia due to possibly infected AV graft Clinically improved-afebrile-no leukocytosis On IV vancomycin-ID recommending vancomycin through 7/10 TEE with no endocarditis Per vascular surgery-no evidence of AV graft infection Please ensure follow-up with infectious disease  clinic.   ESRD on HD TTS Nephrology followed closely during this hospital course.   See above regarding concern for infected AV graft. Please continue outpatient hemodialysis as previous.   Acute on chronic systolic/diastolic heart failure Volume removal with HD-volume status has stabilized. Per cardiology-no concern for infiltrative cardiomyopathy Continue Coreg/hydralazine/Imdur Per cardiology-if compliant with medications-repeat echocardiogram in several months to recheck EF   HTN Previously noncompliant-now with significant cardiac issues-extensive counseling-he seems to be very motivated. BP fluctuating but overall better-continue Imdur/hydralazine/Coreg-continue outpatient optimization.   DM-2 (A1c 7.7 on 4/21) CBG stable Continue Semglee 7 units daily and SSI.  Normocytic anemia Secondary to ESRD Aranesp/iron per nephrology service   GERD PPI   Mechanical fall  Recurrent issue PT/OT eval-SNF recommended   BMI: Estimated body mass index is 25.06 kg/m as calculated from the following:   Height as of this encounter: 5\' 7"  (1.702 m).   Weight as of this encounter: 72.6 kg.    Discharge Diagnoses:  Principal Problem:   MRSA bacteremia Active Problems:   End-stage renal disease on hemodialysis (HCC)   History of anemia due to chronic kidney disease   Essential hypertension   DM2 (diabetes mellitus, type 2) (HCC)   High anion gap metabolic acidosis   Fall   GERD (gastroesophageal reflux disease)   Infection of AV graft for dialysis [facility])   Discharge Instructions: CBGs before meals and at bedtime  Activity:  As tolerated with Full fall precautions use walker/cane & assistance as needed  Discharge Instructions     Call MD for:  difficulty  breathing, headache or visual disturbances   Complete by: As directed    Call MD for:  persistant nausea and vomiting   Complete by: As directed    Diet - low sodium heart healthy   Complete by: As directed     Discharge instructions   Complete by: As directed    Follow with Primary MD  [name], Clinic in 1-2 weeks  Please get a complete blood count and chemistry panel checked by your Primary MD at your next visit, and again as instructed by your Primary MD.  Get Medicines reviewed and adjusted: Please take all your medications with you for your next visit with your Primary MD  Laboratory/radiological data: Please request your Primary MD to go over all hospital tests and procedure/radiological results at the follow up, please ask your Primary MD to get all Hospital records sent to his/her office.  In some cases, they will be blood work, cultures and biopsy results pending at the time of your discharge. Please request that your primary care M.D. follows up on these results.  Also Note the following: If you experience worsening of your admission symptoms, develop shortness of breath, life threatening emergency, suicidal or homicidal thoughts you must seek medical attention immediately by calling 911 or calling your MD immediately  if symptoms less severe.  You must read complete instructions/literature along with all the possible adverse reactions/side effects for all the Medicines you take and that have been prescribed to you. Take any new Medicines after you have completely understood and accpet all the possible adverse reactions/side effects.   Do not drive when taking Pain medications or sleeping medications (Benzodaizepines)  Do not take more than prescribed Pain, Sleep and Anxiety Medications. It is not advisable to combine anxiety,sleep and pain medications without talking with your primary care practitioner  Special Instructions: If you have smoked or chewed Tobacco  in the last 2 yrs please stop smoking, stop any regular Alcohol  and or any Recreational drug use.  Wear Seat belts while driving.  Please note: You were cared for by a hospitalist during your hospital stay. Once you are  discharged, your primary care physician will handle any further medical issues. Please note that NO REFILLS for any discharge medications will be authorized once you are discharged, as it is imperative that you return to your primary care physician (or establish a relationship with a primary care physician if you do not have one) for your post hospital discharge needs so that they can reassess your need for medications and monitor your lab values.   Home infusion instructions   Complete by: As directed    Instructions: Flushing of vascular access device: 0.9% NaCl pre/post medication administration and prn patency; Heparin 100 u/ml, 5ml for implanted ports and Heparin 10u/ml, 5ml for all other central venous catheters.   Increase activity slowly   Complete by: As directed    No wound care   Complete by: As directed       Allergies as of 10/26/2022       Reactions   Sulfa Antibiotics Rash   Severe rash. Severe rash.     Severe rash.   Other Other (See Comments)   Patient states he is allergic to the TB test - unknown reaction during childhood    Tuberculin Ppd    Other Reaction(s): Other (See Comments)   Bactrim [sulfamethoxazole-trimethoprim] Rash   Cephalexin Other (See Comments), Rash   Patient with severe drug reaction including exfoliating skin rash  and hypotension after being prescribed both cephalexin and sulfamethoxazole-trimethoprim simultaneously. Favor SMX as much more likely culprit as patient had tolerated other cephalosporins in the past, but cannot say with complete certainty that this was not related to cephalexin. Other Reaction(s): Other, Other (See Comments) Patient with severe drug reaction including exfoliating skin rash and hypotension after being prescribed both cephalexin and sulfamethoxazole-trimethoprim simultaneously. Favor SMX as much more likely culprit as patient had tolerated other cephalosporins in the past, but cannot say with complete certainty that this was  not related to cephalexin.   "Patient with severe drug reaction including exfoliating skin rash and hypotension after being prescribed both cephalexin and sulfamethoxazole-trimethoprim simultaneously. Favor SMX as much more likely culprit as patient had tolerated other cephalosporins in the past, but cannot say with complete certainty that this was not related to cephalexin." As noted in [facility]   "Patient with severe drug reaction including exfoliating skin rash and hypotension after being prescribed both cephalexin and sulfamethoxazole-trimethoprim simultaneously. Favor SMX as much more likely culprit as patient had tolerated other cephalosporins in the past, but cannot say with complete certainty that this was not related to cephalexin." As noted in [facility]  "Patient with severe drug reaction including exfoliating skin rash and hypotension after being prescribed both cephalexin and sulfamethoxazole-trimethoprim simultaneously. Favor SMX as much more likely culprit as patient had tolerated other cephalosporins in the past, but cannot say with complete certainty that this was not related to cephalexin." As noted in [facility]     Patient with severe drug reaction including exfoliating skin rash and hypotension after being prescribed both cephalexin and sulfamethoxazole-trimethoprim simultaneously. Favor SMX as much more likely culprit as patient had tolerated other cephalosporins in the past, but cannot say with complete certainty that this was not related to cephalexin.  "Patient with severe drug reaction including exfoliating skin rash and hypotension after being prescribed both cephalexin and sulfamethoxazole-trimethoprim simultaneously. Favor SMX as much more likely culprit as patient had tolerated other cephalosporins in the past, but cannot say with complete certainty that this was not related to cephalexin." As noted in [facility]  "Patient with severe drug reaction including exfoliating skin  rash and hypotension after being prescribed both cephalexin and sulfamethoxazole-trimethoprim simultaneously. Favor SMX as much more likely culprit as patient had tolerated other cephalosporins in the past, but cannot say with complete certainty that this was not related to cephalexin." As noted in [facility]     Patient with severe drug reaction inc... (TRUNCATED)        Medication List     STOP taking these medications    amLODipine 10 MG tablet Commonly known as: NORVASC   isosorbide-hydrALAZINE 20-37.5 MG tablet Commonly known as: BIDIL   Lantus SoloStar 100 UNIT/ML Solostar Pen Generic drug: insulin glargine   sevelamer carbonate 800 MG tablet Commonly known as: Renvela       TAKE these medications    Accu-Chek Guide test strip Generic drug: glucose blood Use to check fasting blood sugar once daily. 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 Insulin dependent   OneTouch Verio test strip Generic drug: glucose blood Use as directed up to 4 times daily   Acetaminophen Extra Strength 500 MG Tabs Take 1 tablet (500 mg total) by mouth every 8 (eight) hours as needed for mild pain.   Aspirin Low Dose 81 MG tablet Generic drug: aspirin EC Take 1 tablet (81 mg total) by mouth daily.   benzonatate 100 MG capsule Commonly known as: TESSALON Take 1  capsule (100 mg total) by mouth every 8 (eight) hours.   calcitRIOL 0.25 MCG capsule Commonly known as: ROCALTROL Take 1 capsule (0.25 mcg total) by mouth every Tuesday, Thursday, and Saturday at 6 PM.   calcium acetate 667 MG capsule Commonly known as: PHOSLO Take 3 capsules (2,001 mg total) by mouth 3 (three) times daily.   carvedilol 25 MG tablet Commonly known as: COREG Take 1 tablet (25 mg total) by mouth 2 (two) times daily with a meal. What changed:  medication strength how much to take   insulin glargine-yfgn 100 UNIT/ML injection Commonly known as: SEMGLEE Inject 0.07 mLs (7 Units total) into the skin at bedtime.   Insulin  Syringe-Needle U-100 25G X 1" 1 ML Misc For 4 times a day insulin SQ, 1 month supply. Diagnosis E11.65   isosorbide mononitrate 60 MG 24 hr tablet Commonly known as: IMDUR Take 1 tablet (60 mg total) by mouth daily.   melatonin 3 MG Tabs tablet Take 1 tablet (3 mg total) by mouth at bedtime as needed (insomnia).   NovoLOG FlexPen 100 UNIT/ML FlexPen Generic drug: insulin aspart 0-9 Units, Subcutaneous, 3 times daily with meals CBG < 70: Implement Hypoglycemia measures CBG 70 - 120: 0 units CBG 121 - 150: 1 unit CBG 151 - 200: 2 units CBG 201 - 250: 3 units CBG 251 - 300: 5 units CBG 301 - 350: 7 units CBG 351 - 400: 9 units CBG > 400: call MD What changed: additional instructions   OneTouch Delica Plus Lancet33G Misc Use as directed up to 4 times daily.   oxyCODONE-acetaminophen 5-325 MG tablet Commonly known as: PERCOCET/ROXICET Take 1 tablet by mouth every 12 (twelve) hours as needed for severe pain.   Protonix 40 MG tablet Generic drug: pantoprazole Take 1 tablet (40 mg total) by mouth daily.   TechLite Pen Needles 32G X 4 MM Misc Generic drug: Insulin Pen Needle Use with insulin pens   vancomycin  IVPB Inject 750 mg into the vein Every Tuesday,Thursday,and Saturday with dialysis. Indication:  MRSA bacteremia First Dose: Yes Last Day of Therapy:  11/27/22 Labs - Once weekly: ESR and CRP, CBC, BMP, vancomycin trough Method of administration: Per hemodialysis protocol at the HD center Method of administration may be changed at the discretion of the patient and/or caregiver's ability to self-administer the medication ordered.               Home Infusion Instuctions  (From admission, onward)           Start     Ordered   10/25/22 0000  Home infusion instructions       Question:  Instructions  Answer:  Flushing of vascular access device: 0.9% NaCl pre/post medication administration and prn patency; Heparin 100 u/ml, 5ml for implanted ports and Heparin 10u/ml, 5ml  for all other central venous catheters.   10/25/22 1119            Contact information for follow-up providers     [facility]. Schedule an appointment as soon as possible for a visit in 1 week(s).          Sande Rives, MD. Schedule an appointment as soon as possible for a visit in 2 week(s).   Specialties: Cardiology, Internal Medicine, Radiology Contact information: [address] [place] [address] [phone]         Odette Fraction, MD Follow up on 11/18/2022.   Specialty: Infectious Diseases Why: appt at 2 pm Contact information: [address] [employer] [address] [place] [address] [phone]              Contact information for after-discharge care     Destination     [facility] .   Service: Skilled Nursing Contact information: [address] [place] [address] [phone]                    Allergies  Allergen Reactions   Sulfa Antibiotics Rash    Severe rash.  Severe rash.     Severe rash.   Other Other (See Comments)    Patient states he is allergic to the TB test - unknown reaction during childhood    Tuberculin Ppd     Other Reaction(s): Other (See Comments)   Bactrim [Sulfamethoxazole-Trimethoprim] Rash   Cephalexin Other (See Comments) and Rash    Patient with severe drug reaction including exfoliating skin rash and hypotension after being prescribed both cephalexin and sulfamethoxazole-trimethoprim simultaneously. Favor SMX as much more likely culprit as patient had tolerated other cephalosporins in the past, but cannot say with complete certainty that this was not related to cephalexin.  Other Reaction(s): Other, Other (See Comments)  Patient with severe drug reaction including exfoliating skin rash and hypotension after being prescribed both cephalexin and sulfamethoxazole-trimethoprim simultaneously. Favor SMX as much more likely culprit as patient had tolerated other  cephalosporins in the past, but cannot say with complete certainty that this was not related to cephalexin.   "Patient with severe drug reaction including exfoliating skin rash and hypotension after being prescribed both cephalexin and sulfamethoxazole-trimethoprim simultaneously. Favor SMX as much more likely culprit as patient had tolerated other cephalosporins in the past, but cannot say with complete certainty that this was not related to cephalexin." As noted in [facility]   "Patient with severe drug reaction including exfoliating skin rash and hypotension after being prescribed both cephalexin and sulfamethoxazole-trimethoprim simultaneously. Favor SMX as much more likely culprit as patient had tolerated other cephalosporins in the past, but cannot say with complete certainty that this was not related to cephalexin." As noted in [facility]   "Patient with severe drug reaction including exfoliating skin rash and hypotension after being prescribed both cephalexin and sulfamethoxazole-trimethoprim simultaneously. Favor SMX as much more likely culprit as patient had tolerated other cephalosporins in the past, but cannot say with complete certainty that this was not related to cephalexin." As noted in [facility]     Patient with severe drug reaction including exfoliating skin rash and hypotension after being prescribed both cephalexin and sulfamethoxazole-trimethoprim simultaneously. Favor SMX as much more likely culprit as patient had tolerated other cephalosporins in the past, but cannot say with complete certainty that this was not related to cephalexin.  "Patient with severe drug reaction including exfoliating skin rash and hypotension after being prescribed both cephalexin and sulfamethoxazole-trimethoprim simultaneously. Favor SMX as much more likely culprit as patient had tolerated other cephalosporins in the past, but cannot say with complete certainty that this was not related to cephalexin." As  noted in [facility]  "Patient with severe drug reaction including exfoliating skin rash and hypotension after being prescribed both cephalexin and sulfamethoxazole-trimethoprim simultaneously. Favor SMX as much more likely culprit as patient had tolerated other cephalosporins in the past, but cannot say with complete certainty that this was not related to cephalexin." As noted in [facility]     Patient with severe drug reaction inc... (TRUNCATED)     Other Procedures/Studies: ECHO  TEE  Result Date: 10/25/2022    TRANSESOPHOGEAL ECHO REPORT   Patient Name:   [name] Date of Exam: 10/25/2022 Medical Rec #:  [id number]       Height:       67.0 in Accession #:    [id number]      Weight:       160.0 lb Date of Birth:  [birth year]      BSA:          1.839 m Patient Age:    51 years        BP:           163/80 mmHg Patient Gender: M               HR:           80 bpm. Exam Location:  Inpatient Procedure: Transesophageal Echo, Cardiac Doppler and Color Doppler Indications:     Bacteremia  History:         Patient has prior history of Echocardiogram examinations, most                  recent 10/22/2022. Arrythmias:LBBB; Risk Factors:Diabetes and                  Hypertension.  Sonographer:     Darlys Gales Referring Phys:  [id number] Jonita Albee Diagnosing Phys: Chilton Si MD PROCEDURE: After discussion of the risks and benefits of a TEE, an informed consent was obtained from the patient. The transesophogeal probe was passed without difficulty through the esophogus of the patient. Sedation performed by different physician. The patient was monitored while under deep sedation. Anesthestetic sedation was provided intravenously by Anesthesiology: 130mg  of Propofol, 0mg  of Lidocaine. The patient's vital signs; including heart rate, blood pressure, and oxygen saturation; remained stable throughout the procedure. The patient developed no complications during the procedure.  IMPRESSIONS  1. Left ventricular  ejection fraction, by estimation, is 20 to 25%. The left ventricle has severely decreased function. The left ventricle demonstrates global hypokinesis.  2. Right ventricular systolic function is moderately reduced. The right ventricular size is mildly enlarged.  3. No left atrial/left atrial appendage thrombus was detected.  4. A small pericardial effusion is present. There is no evidence of cardiac tamponade.  5. The mitral valve is normal in structure. Trivial mitral valve regurgitation. No evidence of mitral stenosis.  6. Tricuspid valve regurgitation is mild to moderate.  7. The aortic valve is tricuspid. Aortic valve regurgitation is not visualized. No aortic stenosis is present.  8. The inferior vena cava is normal in size with greater than 50% respiratory variability, suggesting right atrial pressure of 3 mmHg. Conclusion(s)/Recommendation(s): No evidence of vegetation/infective endocarditis on this transesophageael echocardiogram. FINDINGS  Left Ventricle: Left ventricular ejection fraction, by estimation, is 20 to 25%. The left ventricle has severely decreased function. The left ventricle demonstrates global hypokinesis. The left ventricular internal cavity size was normal in size. There is no left ventricular hypertrophy. Right Ventricle: The right ventricular size is mildly enlarged. No increase in right ventricular wall thickness. Right ventricular systolic function is moderately reduced. Left Atrium: Left atrial size was normal in size. No left atrial/left atrial appendage thrombus was detected. Right Atrium: Right atrial size was normal in size. Pericardium: A small pericardial effusion is present. There is no evidence of cardiac tamponade. Mitral Valve: The mitral valve is normal in structure. Trivial mitral valve regurgitation. No evidence of mitral valve stenosis. Tricuspid Valve: The tricuspid valve  is normal in structure. Tricuspid valve regurgitation is mild to moderate. No evidence of tricuspid  stenosis. Aortic Valve: The aortic valve is tricuspid. Aortic valve regurgitation is not visualized. No aortic stenosis is present. Pulmonic Valve: The pulmonic valve was normal in structure. Pulmonic valve regurgitation is trivial. No evidence of pulmonic stenosis. Aorta: The aortic root is normal in size and structure. Venous: The inferior vena cava is normal in size with greater than 50% respiratory variability, suggesting right atrial pressure of 3 mmHg. IAS/Shunts: No atrial level shunt detected by color flow Doppler. Chilton Si MD Electronically signed by Chilton Si MD Signature Date/Time: 10/25/2022/1:37:12 PM    Final    EP STUDY  Result Date: 10/25/2022 See surgical note for result.  VAS US DUPLEX DIALYSIS ACCESS (AVF, AVG)  Result Date: 10/24/2022 DIALYSIS ACCESS Patient Name:  [name]  Date of Exam:   10/24/2022 Medical Rec #: [id number]        Accession #:    [id number] Date of Birth: [birth year]       Patient Gender: M Patient Age:   40 years Exam Location:  [facility] Procedure:      VAS US DUPLEX DIALYSIS ACCESS (AVF, AVG) Referring Phys: Heath Lark --------------------------------------------------------------------------------  Reason for Exam: Infection of AV graft for dialysis. Access Site: Right Upper Extremity. Access Type: Forearm loop AVG. History: 02/20/21 Rt AVG creation. Comparison Study: no prior Performing Technologist: Argentina Ponder RVS  Examination Guidelines: A complete evaluation includes B-mode imaging, spectral Doppler, color Doppler, and power Doppler as needed of all accessible portions of each vessel. Unilateral testing is considered an integral part of a complete examination. Limited examinations for reoccurring indications may be performed as noted.  Findings:   +--------------------+----------+-----------------+--------+ AVG                 PSV (cm/s)Flow Vol (mL/min)Describe +--------------------+----------+-----------------+--------+  Native artery inflow   420          1731                +--------------------+----------+-----------------+--------+ Arterial anastomosis   494                              +--------------------+----------+-----------------+--------+ Prox graft             160                              +--------------------+----------+-----------------+--------+ Mid graft              191                              +--------------------+----------+-----------------+--------+ Distal graft           140                              +--------------------+----------+-----------------+--------+ Venous anastomosis     349                              +--------------------+----------+-----------------+--------+ Venous outflow         270                              +--------------------+----------+-----------------+--------+  Summary: Patent arteriovenous graft. *See table(s) above for measurements and observations.  Diagnosing physician: Heath Lark Electronically signed by Heath Lark on 10/24/2022 at 10:05:07 AM.    --------------------------------------------------------------------------------   Final    ECHOCARDIOGRAM COMPLETE  Result Date: 10/22/2022    ECHOCARDIOGRAM REPORT   Patient Name:   [name] Date of Exam: 10/22/2022 Medical Rec #:  [id number]       Height:       67.0 in Accession #:    [id number]      Weight:       161.8 lb Date of Birth:  04-28-1971      BSA:          1.848 m Patient Age:    51 years        BP:           197/86 mmHg Patient Gender: M               HR:           86 bpm. Exam Location:  Inpatient Procedure: 2D Echo, Cardiac Doppler and Color Doppler Indications:    Bacteremia  History:        Patient has prior history of Echocardiogram examinations, most                 recent 05/24/2022. CHF, Arrythmias:LBBB, Signs/Symptoms:Shortness                 of Breath; Risk Factors:Hypertension, Diabetes and Dyslipidemia.                 ESRD.  Sonographer:     Lucendia Herrlich Referring Phys: [id number] CORNELIUS N VAN DAM  Sonographer Comments: Image acquisition challenging due to uncooperative patient. IMPRESSIONS  1. There is severe concentric LVH and echogenic myocardium. Consider evalaution for infiltrative CM such as cardiac amyloid. . Left ventricular ejection fraction, by estimation, is 20 to 25%. The left ventricle has severely decreased function. The left ventricle demonstrates global hypokinesis. There is severe concentric left ventricular hypertrophy. Left ventricular diastolic function could not be evaluated.  2. Right ventricular systolic function is severely reduced. The right ventricular size is normal. There is severely elevated pulmonary artery systolic pressure. The estimated right ventricular systolic pressure is 66.6 mmHg.  3. Left atrial size was mildly dilated.  4. Right atrial size was mild to moderately dilated.  5. A small pericardial effusion is present. The pericardial effusion is posterior to the left ventricle. There is no evidence of cardiac tamponade.  6. The mitral valve is normal in structure. No evidence of mitral valve regurgitation. No evidence of mitral stenosis.  7. Tricuspid valve regurgitation is moderate.  8. The aortic valve is normal in structure. Aortic valve regurgitation is trivial. No aortic stenosis is present.  9. Patient uncooperative. Only parasternal views available. No apical or subcostal windows. FINDINGS  Left Ventricle: There is severe concentric LVH and echogenic myocardium. Consider evalaution for infiltrative CM such as cardiac amyloid. Left ventricular ejection fraction, by estimation, is 20 to 25%. The left ventricle has severely decreased function. The left ventricle demonstrates global hypokinesis. The left ventricular internal cavity size was normal in size. There is severe concentric left ventricular hypertrophy. Left ventricular diastolic function could not be evaluated. Right Ventricle: The right ventricular  size is normal. No increase in right ventricular wall thickness. Right ventricular systolic function is severely reduced. There is severely elevated pulmonary artery systolic pressure. The tricuspid regurgitant velocity is 3.59 m/s, and with  an assumed right atrial pressure of 15 mmHg, the estimated right ventricular systolic pressure is 66.6 mmHg. Left Atrium: Left atrial size was mildly dilated. Right Atrium: Right atrial size was mild to moderately dilated. Pericardium: A small pericardial effusion is present. The pericardial effusion is posterior to the left ventricle. There is no evidence of cardiac tamponade. Mitral Valve: The mitral valve is normal in structure. No evidence of mitral valve regurgitation. No evidence of mitral valve stenosis. Tricuspid Valve: The tricuspid valve is normal in structure. Tricuspid valve regurgitation is moderate . No evidence of tricuspid stenosis. Aortic Valve: The aortic valve is normal in structure. Aortic valve regurgitation is trivial. No aortic stenosis is present. Pulmonic Valve: The pulmonic valve was normal in structure. Pulmonic valve regurgitation is trivial. No evidence of pulmonic stenosis. Aorta: The aortic root is normal in size and structure. Venous: The inferior vena cava was not well visualized. IAS/Shunts: No atrial level shunt detected by color flow Doppler.  LEFT VENTRICLE PLAX 2D LVIDd:         4.10 cm LVIDs:         3.70 cm LV PW:         1.70 cm LV IVS:        1.40 cm LVOT diam:     1.90 cm LVOT Area:     2.84 cm  LEFT ATRIUM         Index LA diam:    3.90 cm 2.11 cm/m   AORTA Ao Asc diam: 3.10 cm TRICUSPID VALVE TR Peak grad:   51.6 mmHg TR Vmax:        359.00 cm/s  SHUNTS Systemic Diam: 1.90 cm Arvilla Meres MD Electronically signed by Arvilla Meres MD Signature Date/Time: 10/22/2022/10:25:20 AM    Final    DG Knee Complete 4 Views Right  Result Date: 10/20/2022 CLINICAL DATA:  Ground level fall.  Right knee pain. EXAM: RIGHT KNEE - COMPLETE 4+  VIEW COMPARISON:  07/11/2022. FINDINGS: No fracture.  No bone lesion. 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 Knee joint normally spaced and aligned. No degenerative/arthropathic changes. No joint effusion. Soft tissues are unremarkable. IMPRESSION: 1. No fracture or acute finding. Electronically Signed   By: Amie Portland M.D.   On: 10/20/2022 14:47   DG Elbow Complete Right  Result Date: 10/20/2022 CLINICAL DATA:  Ground level fall.  Elbow pain. EXAM: RIGHT ELBOW - COMPLETE 3+ VIEW COMPARISON:  03/06/2010. FINDINGS: No acute fracture. Prosthetic radial head appears well seated and aligned. Narrowing of the humeral olecranon joint space with mild associated subchondral sclerosis and marginal osteophytes. No convincing joint effusion. Forearm AV fistula. IMPRESSION: 1. No fracture or acute finding. 2. 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 Well seated and positioned radial head prosthesis. Electronically Signed   By: Amie Portland M.D.   On: 10/20/2022 14:45   DG Chest Portable 1 View  Result Date: 10/20/2022 CLINICAL DATA:  Pain.  Fall.  Dialysis patient. EXAM: PORTABLE CHEST 1 VIEW COMPARISON:  September 17, 2022 FINDINGS: Stable cardiomegaly. No pneumothorax. No overt edema, nodule, mass, or infiltrate. No acute abnormalities are noted. A nonacute fracture of the distal left clavicle is again identified. Degenerative changes identified in the shoulders. IMPRESSION: 1. No active disease. 2. Nonacute fracture of the distal left clavicle. Electronically Signed   By: Gerome Sam III M.D.   On: 10/20/2022 14:43     TODAY-DAY OF DISCHARGE:  Subjective:   [name]  today has no headache,no chest abdominal pain,no new weakness tingling or numbness, feels much better wants to go home today.   Objective:   Blood pressure (!) 158/77, pulse 76, temperature 98 F (36.7 C), temperature source Axillary, resp. rate 20, height 5\' 7"  (1.702 m),  weight 72.6 kg, SpO2 100 %.  Intake/Output Summary (Last 24 hours) at 10/26/2022 1042 Last data filed at 10/25/2022 2000 Gross per 24 hour  Intake 240 ml  Output --  Net 240 ml   Filed Weights   10/24/22 0300 10/24/22 1544 10/24/22 2059  Weight: 75.5 kg 74.3 kg 72.6 kg    Exam: Awake Alert, Oriented *3, No new F.N deficits, Normal affect [place].AT,PERRAL Supple Neck,No JVD, No cervical lymphadenopathy appriciated.  Symmetrical Chest wall movement, Good air movement bilaterally, CTAB RRR,No Gallops,Rubs or new Murmurs, No Parasternal Heave +ve B.Sounds, Abd Soft, Non tender, No organomegaly appriciated, No rebound -guarding or rigidity. No Cyanosis, Clubbing or edema, No new Rash or bruise   PERTINENT RADIOLOGIC STUDIES: ECHO TEE  Result Date: 10/25/2022    TRANSESOPHOGEAL ECHO REPORT   Patient Name:   [name] Date of Exam: 10/25/2022 Medical Rec #:  [id number]       Height:       67.0 in Accession #:    [id number]      Weight:       160.0 lb Date of Birth:  07-18-1970      BSA:          1.839 m Patient Age:    51 years        BP:           163/80 mmHg Patient Gender: M               HR:           80 bpm. Exam Location:  Inpatient Procedure: Transesophageal Echo, Cardiac Doppler and Color Doppler Indications:     Bacteremia  History:         Patient has prior history of Echocardiogram examinations, most                  recent 10/22/2022. Arrythmias:LBBB; Risk Factors:Diabetes and                  Hypertension.  Sonographer:     Darlys Gales Referring Phys:  [id number] Jonita Albee Diagnosing Phys: Chilton Si MD PROCEDURE: After discussion of the risks and benefits of a TEE, an informed consent was obtained from the patient. The transesophogeal probe was passed without difficulty through the esophogus of the patient. Sedation performed by different physician. The patient was monitored while under deep sedation. Anesthestetic sedation was provided intravenously by Anesthesiology: 130mg   of Propofol, 0mg  of Lidocaine. The patient's vital signs; including heart rate, blood pressure, and oxygen saturation; remained stable throughout the procedure. The patient developed no complications during the procedure.  IMPRESSIONS  1. Left ventricular ejection fraction, by estimation, is 20 to 25%. The left ventricle has severely decreased function. The left ventricle demonstrates global hypokinesis.  2. Right ventricular systolic function is moderately reduced. The right ventricular size is mildly enlarged.  3. No left atrial/left atrial appendage thrombus was detected.  4. A small pericardial effusion is present. There is no evidence of cardiac tamponade.  5. The mitral valve is normal in structure. Trivial mitral valve regurgitation. No evidence of mitral stenosis.  6. Tricuspid valve regurgitation is mild to moderate.  7. The aortic valve is tricuspid.  Aortic valve regurgitation is not visualized. No aortic stenosis is present.  8. The inferior vena cava is normal in size with greater than 50% respiratory variability, suggesting right atrial pressure of 3 mmHg. Conclusion(s)/Recommendation(s): No evidence of vegetation/infective endocarditis on this transesophageael echocardiogram. FINDINGS  Left Ventricle: Left ventricular ejection fraction, by estimation, is 20 to 25%. The left ventricle has severely decreased function. The left ventricle demonstrates global hypokinesis. The left ventricular internal cavity size was normal in size. There is no left ventricular hypertrophy. Right Ventricle: The right ventricular size is mildly enlarged. No increase in right ventricular wall thickness. Right ventricular systolic function is moderately reduced. Left Atrium: Left atrial size was normal in size. No left atrial/left atrial appendage thrombus was detected. Right Atrium: Right atrial size was normal in size. Pericardium: A small pericardial effusion is present. There is no evidence of cardiac tamponade. Mitral  Valve: The mitral valve is normal in structure. Trivial mitral valve regurgitation. No evidence of mitral valve stenosis. Tricuspid Valve: The tricuspid valve is normal in structure. Tricuspid valve regurgitation is mild to moderate. No evidence of tricuspid stenosis. Aortic Valve: The aortic valve is tricuspid. Aortic valve regurgitation is not visualized. No aortic stenosis is present. Pulmonic Valve: The pulmonic valve was normal in structure. Pulmonic valve regurgitation is trivial. No evidence of pulmonic stenosis. Aorta: The aortic root is normal in size and structure. Venous: The inferior vena cava is normal in size with greater than 50% respiratory variability, suggesting right atrial pressure of 3 mmHg. IAS/Shunts: No atrial level shunt detected by color flow Doppler. Chilton Si MD Electronically signed by Chilton Si MD Signature Date/Time: 10/25/2022/1:37:12 PM    Final    EP STUDY  Result Date: 10/25/2022 See surgical note for result.    PERTINENT LAB RESULTS: CBC: Recent Labs    10/24/22 0740  WBC 8.1  HGB 7.7*  HCT 25.4*  PLT 407*   CMET CMP     Component Value Date/Time   NA 134 (L) 10/24/2022 0740   NA 138 05/09/2020 1455   K 5.0 10/24/2022 0740   CL 96 (L) 10/24/2022 0740   CO2 22 10/24/2022 0740   GLUCOSE 135 (H) 10/24/2022 0740   BUN 55 (H) 10/24/2022 0740   BUN 26 (H) 05/09/2020 1455   CREATININE 9.47 (H) 10/24/2022 0740   CREATININE 0.93 09/06/2014 1657   CALCIUM 8.2 (L) 10/24/2022 0740   CALCIUM 7.6 (L) 05/12/2021 0923   PROT 7.0 10/21/2022 0549   PROT 5.8 (L) 05/09/2020 1455   ALBUMIN 2.8 (L) 10/24/2022 0740   ALBUMIN 2.9 (L) 05/09/2020 1455   AST 22 10/21/2022 0549   ALT 27 10/21/2022 0549   ALKPHOS 223 (H) 10/21/2022 0549   BILITOT 0.5 10/21/2022 0549   BILITOT <0.2 05/09/2020 1455   GFRNONAA 6 (L) 10/24/2022 0740   GFRNONAA >89 09/06/2014 1657    GFR Estimated Creatinine Clearance: 8.8 mL/min (A) (by C-G formula based on SCr of 9.47  mg/dL (H)). No results for input(s): "LIPASE", "AMYLASE" in the last 72 hours. No results for input(s): "CKTOTAL", "CKMB", "CKMBINDEX", "TROPONINI" in the last 72 hours. Invalid input(s): "POCBNP" No results for input(s): "DDIMER" in the last 72 hours. No results for input(s): "HGBA1C" in the last 72 hours. No results for input(s): "CHOL", "HDL", "LDLCALC", "TRIG", "CHOLHDL", "LDLDIRECT" in the last 72 hours. No results for input(s): "TSH", "T4TOTAL", "T3FREE", "THYROIDAB" in the last 72 hours.  Invalid input(s): "FREET3" No results for input(s): "VITAMINB12", "FOLATE", "FERRITIN", "TIBC", "IRON", "RETICCTPCT" in  the last 72 hours. Coags: No results for input(s): "INR" in the last 72 hours.  Invalid input(s): "PT" Microbiology: Recent Results (from the past 240 hour(s))  Blood culture (routine x 2)     Status: None   Collection Time: 10/20/22  6:43 PM   Specimen: BLOOD LEFT ARM  Result Value Ref Range Status   Specimen Description BLOOD LEFT ARM  Final   Special Requests   Final    BOTTLES DRAWN AEROBIC AND ANAEROBIC Blood Culture results may not be optimal due to an inadequate volume of blood received in culture bottles   Culture   Final    NO GROWTH 5 DAYS Performed at [facility], [address] [place], [place] [address]    Report Status 10/25/2022 FINAL  Final  Blood culture (routine x 2)     Status: None   Collection Time: 10/20/22  9:47 PM   Specimen: BLOOD LEFT HAND  Result Value Ref Range Status   Specimen Description BLOOD LEFT HAND  Final   Special Requests   Final    BOTTLES DRAWN AEROBIC AND ANAEROBIC Blood Culture adequate volume   Culture   Final    NO GROWTH 5 DAYS Performed at [facility], [address], [place], [place] [address]    Report Status 10/25/2022 FINAL  Final    FURTHER DISCHARGE INSTRUCTIONS:  Get Medicines reviewed and adjusted: Please take all your medications with you for your next visit with your Primary  MD  Laboratory/radiological data: Please request your Primary MD to go over all hospital tests and procedure/radiological results at the follow up, please ask your Primary MD to get all Hospital records sent to his/her office.  In some cases, they will be blood work, cultures and biopsy results pending at the time of your discharge. Please request that your primary care M.D. goes through all the records of your hospital data and follows up on these results.  Also Note the following: If you experience worsening of your admission symptoms, develop shortness of breath, life threatening emergency, suicidal or homicidal thoughts you must seek medical attention immediately by calling 911 or calling your MD immediately  if symptoms less severe.  You must read complete instructions/literature along with all the possible adverse reactions/side effects for all the Medicines you take and that have been prescribed to you. Take any new Medicines after you have completely understood and accpet all the possible adverse reactions/side effects.   Do not drive when taking Pain medications or sleeping medications (Benzodaizepines)  Do not take more than prescribed Pain, Sleep and Anxiety Medications. It is not advisable to combine anxiety,sleep and pain medications without talking with your primary care practitioner  Special Instructions: If you have smoked or chewed Tobacco  in the last 2 yrs please stop smoking, stop any regular Alcohol  and or any Recreational drug use.  Wear Seat belts while driving.  Please note: You were cared for by a hospitalist during your hospital stay. Once you are discharged, your primary care physician will handle any further medical issues. Please note that NO REFILLS for any discharge medications will be authorized once you are discharged, as it is imperative that you return to your primary care physician (or establish a relationship with a primary care physician if you do not have  one) for your post hospital discharge needs so that they can reassess your need for medications and monitor your lab values.  Total Time spent coordinating discharge including  counseling, education and face to face time equals greater than 30 minutes.  SignedJeoffrey Massed 10/26/2022 10:42 AM

## 2022-10-26 NOTE — Progress Notes (Signed)
Received patient in bed .Awake,alert and oriented x 4.  Medicines given : Vancomycin 750 mg.                              Zofran 4 mg.  Access used : Right upper arm AVF that worked well.  Duration of treatment: 4 hours.  Fluid removed: Achieved prescribed fluid goal of 4 liters.  Hemo comment: He vomited of partially digested food ,his breakfast?on his last hour of treatment.His vitals were stable and normal when he vomit.  Hand off to the patient's nurse.

## 2022-10-26 NOTE — TOC Transition Note (Signed)
Transition of Care [facility]) - CM/SW Discharge Note   Patient Details  Name: [name] MRN: [id number] Date of Birth: 03-Jul-1970  Transition of Care [facility]) CM/SW Contact:  Donnalee Curry, LCSWA Phone Number: 10/26/2022, 4:04 PM   Clinical Narrative:     SW spoke with [name] Lemmings [facility] [phone]) confirmed able to accept today.   Pt currently in HD, will call [facility] once complete  Update 1228pm SW spoke with Rubin Payor, RN reports pt has about 30 mins left in HD.   SW called [facility], notified pt will be ready around 1pm. [facility] currently has multiple rides ahead of pt.  Final next level of care: Skilled Nursing Facility Barriers to Discharge: Barriers Resolved   Patient Goals and CMS Choice CMS Medicare.gov Compare Post Acute Care list provided to:: Patient Choice offered to / list presented to : Patient  Discharge Placement                Patient chooses bed at:  [name]) Patient to be transferred to facility by: [facility]   Patient and family notified of of transfer: 10/26/22  Discharge Plan and Services Additional resources added to the After Visit Summary for   In-house Referral: Clinical Social Work   Post Acute Care Choice: Skilled Nursing Facility                               Social Determinants of Health (SDOH) Interventions SDOH Screenings   Food Insecurity: Food Insecurity Present (05/08/2022)  Housing: High Risk (10/21/2022)  Transportation Needs: Unmet Transportation Needs (10/21/2022)  Utilities: Not At Risk (10/21/2022)  Depression (PHQ2-9): Low Risk  (10/13/2019)  Financial Resource Strain: High Risk (05/08/2022)  Tobacco Use: Low Risk  (10/25/2022)     Readmission Risk Interventions    10/22/2022    2:17 PM 11/07/2021    4:21 PM 02/19/2021    3:52 PM  Readmission Risk Prevention Plan  Transportation Screening Complete Complete Complete  Medication Review Oceanographer) Complete Complete Complete  PCP or Specialist appointment within  3-5 days of discharge Complete Complete Complete  HRI or Home Care Consult Complete Complete Complete  SW Recovery Care/Counseling Consult Complete Complete Complete  Palliative Care Screening Not Applicable Not Applicable Not Applicable  Skilled Nursing Facility Complete Not Applicable Not Applicable

## 2022-10-26 NOTE — Progress Notes (Signed)
Called report, left voicemail with [facility].

## 2022-10-26 NOTE — Progress Notes (Signed)
Called report to [facility], left voicemail.   [phone]

## 2022-10-26 NOTE — Plan of Care (Signed)
[facility] Patient Discharge Orders- [facility] CLINIC: [facility]/DC to SNF  Patient's name: [name] Admit/DC Dates: 10/20/2022 - 10/26/22  Discharge Diagnoses: MRSA Bacteremia - TEE (-) for endocarditis, ECHO EF 20-25%, blood cultures from 6/2 no growth to date  Aranesp: Given: Yes    Date and amount of last dose: given on 6/3   Last Hgb: 7.7 PRBC's Given: No ESA dose for discharge: mircera 225 mcg IV q 2 weeks  IV Iron dose at discharge: No Fe while he is on ABXs!  Heparin change: No  EDW Change: No. Removed 4L 6/8. We need to push his UF!!!!  Bath Change: No  Access intervention/Change: No, however, patient apparently had a de-clot of AVG out of state possibly around 5/3. [facility] evaluated the AVG and did not feel this was the source of patient's infection since he was found to have an abscess to his L arm and nasal septum.  Calcitriol change: No  Discharge Labs: Calcium 8.2 Phosphorus 5.1 Albumin 2.8 K+ 5.0  IV Antibiotics: Yes Details: MRSA Bacteremia - IV Vancomycin 750mg  with HD, last dose to give on 11/26/22 per ID. See ID's note.  On Coumadin?: No   OTHER/APPTS/LAB ORDERS: He already has an appointment scheduled with ID on 11/18/22.    D/C Meds to be reconciled by nurse after every discharge.  Completed By: Salome Holmes, NP-C   Reviewed by: MD:______ RN_______

## 2022-10-26 NOTE — Progress Notes (Signed)
Patient handed scheduled medication, he took the medication and held it.  States, "you can leave, I will take these when I feel like it".   Primary RN provided education regarding the medication administration policy and the need for him to take the medication.  Patient refused multiple times, and left medication on bedside table. " I'm not taking these".

## 2022-10-26 NOTE — Progress Notes (Signed)
Patient refused wound care again.

## 2022-10-26 NOTE — Progress Notes (Signed)
Patient safely transported to HD.

## 2022-10-26 NOTE — Progress Notes (Signed)
Patient refusing medications, removed tele leads and is in the restroom.

## 2022-10-26 NOTE — Progress Notes (Signed)
Patient is refusing to confirm DOB, he removed arm band and threw it on the floor.   Primary RN attempted to replace armband, patient refusing until his take out order is delivered to bedside.

## 2022-10-28 ENCOUNTER — Encounter (HOSPITAL_COMMUNITY): Payer: Self-pay | Admitting: Cardiovascular Disease

## 2022-10-28 SURGERY — ECHOCARDIOGRAM, TRANSESOPHAGEAL
Anesthesia: Monitor Anesthesia Care

## 2022-10-28 NOTE — Progress Notes (Signed)
Late Note Entry   Pt was d/c to snf on Saturday. Contacted [facility] this morning to advise clinic of pt's d/c date and that pt should resume care tomorrow.   Olivia Canter Renal Navigator [phone]

## 2022-11-06 DIAGNOSIS — E1122 Type 2 diabetes mellitus with diabetic chronic kidney disease: Secondary | ICD-10-CM | POA: Diagnosis not present

## 2022-11-06 DIAGNOSIS — I5042 Chronic combined systolic (congestive) and diastolic (congestive) heart failure: Secondary | ICD-10-CM | POA: Diagnosis not present

## 2022-11-06 DIAGNOSIS — F419 Anxiety disorder, unspecified: Secondary | ICD-10-CM | POA: Diagnosis not present

## 2022-11-12 DIAGNOSIS — I120 Hypertensive chronic kidney disease with stage 5 chronic kidney disease or end stage renal disease: Secondary | ICD-10-CM | POA: Diagnosis not present

## 2022-11-12 DIAGNOSIS — Z992 Dependence on renal dialysis: Secondary | ICD-10-CM | POA: Diagnosis not present

## 2022-11-12 DIAGNOSIS — I517 Cardiomegaly: Secondary | ICD-10-CM | POA: Diagnosis not present

## 2022-11-12 DIAGNOSIS — Z9981 Dependence on supplemental oxygen: Secondary | ICD-10-CM | POA: Diagnosis not present

## 2022-11-12 DIAGNOSIS — R069 Unspecified abnormalities of breathing: Secondary | ICD-10-CM | POA: Diagnosis not present

## 2022-11-12 DIAGNOSIS — R0602 Shortness of breath: Secondary | ICD-10-CM | POA: Diagnosis not present

## 2022-11-12 DIAGNOSIS — R0902 Hypoxemia: Secondary | ICD-10-CM | POA: Diagnosis not present

## 2022-11-12 DIAGNOSIS — N186 End stage renal disease: Secondary | ICD-10-CM | POA: Diagnosis not present

## 2022-11-13 ENCOUNTER — Emergency Department (HOSPITAL_COMMUNITY)
Admission: EM | Admit: 2022-11-13 | Discharge: 2022-11-14 | Disposition: A | Discharge status: Home / Self Care | Payer: 59 | Attending: Emergency Medicine | Admitting: Emergency Medicine

## 2022-11-13 ENCOUNTER — Other Ambulatory Visit: Payer: Self-pay

## 2022-11-13 ENCOUNTER — Encounter (HOSPITAL_COMMUNITY): Payer: Self-pay

## 2022-11-13 DIAGNOSIS — E1122 Type 2 diabetes mellitus with diabetic chronic kidney disease: Secondary | ICD-10-CM | POA: Insufficient documentation

## 2022-11-13 DIAGNOSIS — Z992 Dependence on renal dialysis: Secondary | ICD-10-CM | POA: Diagnosis not present

## 2022-11-13 DIAGNOSIS — I120 Hypertensive chronic kidney disease with stage 5 chronic kidney disease or end stage renal disease: Secondary | ICD-10-CM | POA: Insufficient documentation

## 2022-11-13 DIAGNOSIS — N186 End stage renal disease: Secondary | ICD-10-CM | POA: Diagnosis not present

## 2022-11-13 DIAGNOSIS — I10 Essential (primary) hypertension: Secondary | ICD-10-CM | POA: Diagnosis not present

## 2022-11-13 DIAGNOSIS — R2240 Localized swelling, mass and lump, unspecified lower limb: Secondary | ICD-10-CM | POA: Diagnosis not present

## 2022-11-13 DIAGNOSIS — M7989 Other specified soft tissue disorders: Secondary | ICD-10-CM | POA: Diagnosis not present

## 2022-11-13 NOTE — ED Triage Notes (Addendum)
Patient reports fluid in his bilateral legs and feet. States he had it "for a long time and it is not going away". Patient receives dialysis on T,Th,S. Patient states he went to dialysis yesterday, no complications in his treatment. Patient denies pain.

## 2022-11-14 LAB — I-STAT CHEM 8, ED
BUN: 39 mg/dL — ABNORMAL HIGH (ref 6–20)
Calcium, Ion: 1.03 mmol/L — ABNORMAL LOW (ref 1.15–1.40)
Chloride: 100 mmol/L (ref 98–111)
Creatinine, Ser: 6.9 mg/dL — ABNORMAL HIGH (ref 0.61–1.24)
Glucose, Bld: 167 mg/dL — ABNORMAL HIGH (ref 70–99)
HCT: 38 % — ABNORMAL LOW (ref 39.0–52.0)
Hemoglobin: 12.9 g/dL — ABNORMAL LOW (ref 13.0–17.0)
Potassium: 4 mmol/L (ref 3.5–5.1)
Sodium: 138 mmol/L (ref 135–145)
TCO2: 27 mmol/L (ref 22–32)

## 2022-11-14 NOTE — ED Provider Notes (Signed)
[facility]-EMERGENCY DEPT [facility] Emergency Department Provider Note MRN:  [id number]  Arrival date & time: 11/14/22     Chief Complaint   Leg swelling History of Present Illness   NORMAN PIACENTINI is a 52 y.o. year-old male with a history of ESRD presenting to the ED with chief complaint of leg swelling.  Swelling and discomfort to the legs and feet.  Also endorsing dyspnea.  Denies cough or fever, no chest pain.  No abdominal pain.  Symptoms have been present "for a while".  Last dialysis was Tuesday.  Review of Systems  A thorough review of systems was obtained and all systems are negative except as noted in the HPI and PMH.   Patient's Health History    Past Medical History:  Diagnosis Date   Anxiety 02/2019   Anxiety 10/2019   Diabetes mellitus type II, uncontrolled 07/17/2006   Elevated alkaline phosphatase level 01/2019   ESSENTIAL HYPERTENSION 07/17/2006   Homelessness 10/13/2021   HYPERLIPIDEMIA 11/24/2008   Insomnia 02/2019   Left bundle branch block 02/22/2019   Lesion of lip 10/2019   Pneumonia 10/22/2011   Suicidal ideations 02/2019   Vitamin D deficiency 01/2019    Past Surgical History:  Procedure Laterality Date   ARM SURGERY     AV FISTULA PLACEMENT Right 02/20/2021   Procedure: RIGHT ARTERIOVENOUS GRAFT CREATION;  Surgeon: Chuck Hint, MD;  Location: [facility] OR;  Service: Vascular;  Laterality: Right;   INSERTION OF DIALYSIS CATHETER Left 02/20/2021   Procedure: INSERTION OF DIALYSIS CATHETER USING PALINDROME CATHETER;  Surgeon: Chuck Hint, MD;  Location: [facility];  Service: Vascular;  Laterality: Left;   IR AV DIALY SHUNT INTRO NEEDLE/INTRACATH INITIAL W/PTA/IMG RIGHT Right 11/06/2021   IR FLUORO GUIDE CV LINE RIGHT  09/12/2022   IR REMOVAL TUN CV CATH W/O FL  07/27/2021   IR THROMBECTOMY AV FISTULA W/THROMBOLYSIS INC/SHUNT/IMG RIGHT  09/11/2022   IR US GUIDE VASC ACCESS RIGHT  11/06/2021   IR US GUIDE VASC ACCESS RIGHT  09/11/2022   IR US GUIDE  VASC ACCESS RIGHT  09/12/2022   TEE WITHOUT CARDIOVERSION N/A 10/25/2022   Procedure: TRANSESOPHAGEAL ECHOCARDIOGRAM;  Surgeon: Chilton Si, MD;  Location: [facility] INVASIVE CV LAB;  Service: Cardiovascular;  Laterality: N/A;   ULTRASOUND GUIDANCE FOR VASCULAR ACCESS  02/20/2021   Procedure: ULTRASOUND GUIDANCE FOR VASCULAR ACCESS;  Surgeon: Chuck Hint, MD;  Location: [facility] OR;  Service: Vascular;;    Family History  Problem Relation Age of Onset   Hypertension Mother 13   Cancer Mother        bladder cancer   Alcohol abuse Father    Hypertension Brother     Social History   Socioeconomic History   Marital status: Legally Separated    Spouse name: Not on file   Number of children: Not on file   Years of education: Not on file   Highest education level: Not on file  Occupational History   Occupation: cook    Comment: lost job   Occupation: Company secretary    Comment: Updated June 2013   Occupation: Data processing manager    Comment: Began Jan 2014  Tobacco Use   Smoking status: Never   Smokeless tobacco: Never  Vaping Use   Vaping Use: Never used  Substance and Sexual Activity   Alcohol use: No    Alcohol/week: 0.0 standard drinks of alcohol   Drug use: No   Sexual activity: Yes    Partners: Female  Other Topics  Concern   Not on file  Social History Narrative   Lives with his mother and brother.   Wife has had a foot amputation; is a type 1 DM.   Social Determinants of Health   Financial Resource Strain: High Risk (05/08/2022)   Overall Financial Resource Strain (CARDIA)    Difficulty of Paying Living Expenses: Very hard  Food Insecurity: Food Insecurity Present (05/08/2022)   Hunger Vital Sign    Worried About Running Out of Food in the Last Year: Often true    Ran Out of Food in the Last Year: Often true  Transportation Needs: Unmet Transportation Needs (10/21/2022)   PRAPARE - Administrator, Civil Service (Medical): Yes    Lack of Transportation  (Non-Medical): Yes  Physical Activity: Not on file  Stress: Not on file  Social Connections: Not on file  Intimate Partner Violence: Not At Risk (10/21/2022)   Humiliation, Afraid, Rape, and Kick questionnaire    Fear of Current or Ex-Partner: No    Emotionally Abused: No    Physically Abused: No    Sexually Abused: No     Physical Exam   Vitals:   11/13/22 2210 11/14/22 0015  BP: (!) 171/84 (!) 168/88  Pulse:  86  Resp:  15  Temp:    SpO2:  95%    CONSTITUTIONAL: Well-appearing, NAD NEURO/PSYCH:  Alert and oriented x 3, no focal deficits EYES:  eyes equal and reactive ENT/NECK:  no LAD, no JVD CARDIO: Regular rate, well-perfused, normal S1 and S2 PULM:  CTAB no wheezing or rhonchi GI/GU:  non-distended, non-tender MSK/SPINE:  No gross deformities, no edema SKIN:  no rash, atraumatic   *Additional and/or pertinent findings included in MDM below  Diagnostic and Interventional Summary    EKG Interpretation  Date/Time:  Wednesday November 13 2022 22:21:17 EDT Ventricular Rate:  87 PR Interval:  154 QRS Duration: 158 QT Interval:  444 QTC Calculation: 534 R Axis:   -74 Text Interpretation: Normal sinus rhythm Possible Left atrial enlargement Left axis deviation Left bundle branch block Abnormal ECG When compared with ECG of 11-Sep-2022 00:56, PREVIOUS ECG IS PRESENT Confirmed by Kennis Carina [id number]) on 11/14/2022 12:47:01 AM       Labs Reviewed  I-STAT CHEM 8, ED - Abnormal; Notable for the following components:      Result Value   BUN 39 (*)    Creatinine, Ser 6.90 (*)    Glucose, Bld 167 (*)    Calcium, Ion 1.03 (*)    Hemoglobin 12.9 (*)    HCT 38.0 (*)    All other components within normal limits    No orders to display    Medications - No data to display   Procedures  /  Critical Care Procedures  ED Course and Medical Decision Making  Initial Impression and Ddx Chronic leg swelling, frequent ED visitor, he cannot really specifically say what has  changed in the past several days.  Endorsing dyspnea but in no acute distress on my evaluation normal oxygenation, no increased work of breathing, lungs largely clear.  Suspect he is mildly fluid overloaded in the setting of ESRD.  He is refusing chest x-ray, he agrees to some blood work to ensure his potassium is not too high.  He has dialysis scheduled for tomorrow, this seems appropriate as long as labs reassuring.  Past medical/surgical history that increases complexity of ED encounter: ESRD  Interpretation of Diagnostics I personally reviewed the EKG and my interpretation is  as follows: Bundle branch block, no significant change from prior  Labs reveal normal potassium.  Patient Reassessment and Ultimate Disposition/Management     No emergent process, patient appropriate for discharge.  Patient management required discussion with the following services or consulting groups:  None  Complexity of Problems Addressed Acute illness or injury that poses threat of life of bodily function  Additional Data Reviewed and Analyzed Further history obtained from: Prior labs/imaging results  Additional Factors Impacting ED Encounter Risk None  Elmer Sow. Pilar Plate, MD [facility] Emergency Medicine [facility] [email or web address]  Final Clinical Impressions(s) / ED Diagnoses     ICD-10-CM   1. Leg swelling  M79.89       ED Discharge Orders     None        Discharge Instructions Discussed with and Provided to Patient:     Discharge Instructions      You were evaluated in the Emergency Department and after careful evaluation, we did not find any emergent condition requiring admission or further testing in the hospital.  Your exam/testing today is overall reassuring.  Recommend going to your normal dialysis session later today.  Please return to the Emergency Department if you experience any worsening of your condition.   Thank you for allowing [place] to be a part of  your care.       Sabas Sous, MD 11/14/22 0111

## 2022-11-14 NOTE — Discharge Instructions (Signed)
You were evaluated in the Emergency Department and after careful evaluation, we did not find any emergent condition requiring admission or further testing in the hospital.  Your exam/testing today is overall reassuring.  Recommend going to your normal dialysis session later today.  Please return to the Emergency Department if you experience any worsening of your condition.   Thank you for allowing [place] to be a part of your care.

## 2022-11-16 ENCOUNTER — Encounter (HOSPITAL_COMMUNITY): Payer: Self-pay

## 2022-11-16 ENCOUNTER — Other Ambulatory Visit: Payer: Self-pay

## 2022-11-16 ENCOUNTER — Emergency Department (HOSPITAL_COMMUNITY): Payer: 59

## 2022-11-16 ENCOUNTER — Inpatient Hospital Stay (HOSPITAL_COMMUNITY)
Admission: EM | Admit: 2022-11-16 | Discharge: 2022-11-22 | DRG: 193 | Disposition: A | Discharge status: Home / Self Care | Payer: 59 | Attending: Internal Medicine | Admitting: Internal Medicine

## 2022-11-16 DIAGNOSIS — R0602 Shortness of breath: Secondary | ICD-10-CM

## 2022-11-16 DIAGNOSIS — Z5320 Procedure and treatment not carried out because of patient's decision for unspecified reasons: Secondary | ICD-10-CM | POA: Diagnosis not present

## 2022-11-16 DIAGNOSIS — Z882 Allergy status to sulfonamides status: Secondary | ICD-10-CM | POA: Diagnosis not present

## 2022-11-16 DIAGNOSIS — J810 Acute pulmonary edema: Secondary | ICD-10-CM | POA: Diagnosis not present

## 2022-11-16 DIAGNOSIS — N2581 Secondary hyperparathyroidism of renal origin: Secondary | ICD-10-CM | POA: Diagnosis present

## 2022-11-16 DIAGNOSIS — Z91158 Patient's noncompliance with renal dialysis for other reason: Secondary | ICD-10-CM | POA: Diagnosis not present

## 2022-11-16 DIAGNOSIS — Z881 Allergy status to other antibiotic agents status: Secondary | ICD-10-CM

## 2022-11-16 DIAGNOSIS — K219 Gastro-esophageal reflux disease without esophagitis: Secondary | ICD-10-CM | POA: Diagnosis not present

## 2022-11-16 DIAGNOSIS — Z79899 Other long term (current) drug therapy: Secondary | ICD-10-CM

## 2022-11-16 DIAGNOSIS — I517 Cardiomegaly: Secondary | ICD-10-CM | POA: Diagnosis not present

## 2022-11-16 DIAGNOSIS — Z8614 Personal history of Methicillin resistant Staphylococcus aureus infection: Secondary | ICD-10-CM

## 2022-11-16 DIAGNOSIS — E119 Type 2 diabetes mellitus without complications: Secondary | ICD-10-CM

## 2022-11-16 DIAGNOSIS — Z7982 Long term (current) use of aspirin: Secondary | ICD-10-CM

## 2022-11-16 DIAGNOSIS — I5042 Chronic combined systolic (congestive) and diastolic (congestive) heart failure: Secondary | ICD-10-CM | POA: Diagnosis present

## 2022-11-16 DIAGNOSIS — I120 Hypertensive chronic kidney disease with stage 5 chronic kidney disease or end stage renal disease: Secondary | ICD-10-CM | POA: Diagnosis not present

## 2022-11-16 DIAGNOSIS — Z8249 Family history of ischemic heart disease and other diseases of the circulatory system: Secondary | ICD-10-CM

## 2022-11-16 DIAGNOSIS — F419 Anxiety disorder, unspecified: Secondary | ICD-10-CM | POA: Diagnosis present

## 2022-11-16 DIAGNOSIS — Z888 Allergy status to other drugs, medicaments and biological substances status: Secondary | ICD-10-CM

## 2022-11-16 DIAGNOSIS — J9601 Acute respiratory failure with hypoxia: Secondary | ICD-10-CM | POA: Diagnosis not present

## 2022-11-16 DIAGNOSIS — J189 Pneumonia, unspecified organism: Principal | ICD-10-CM

## 2022-11-16 DIAGNOSIS — R9431 Abnormal electrocardiogram [ECG] [EKG]: Secondary | ICD-10-CM | POA: Diagnosis present

## 2022-11-16 DIAGNOSIS — E1165 Type 2 diabetes mellitus with hyperglycemia: Secondary | ICD-10-CM | POA: Diagnosis present

## 2022-11-16 DIAGNOSIS — Z794 Long term (current) use of insulin: Secondary | ICD-10-CM | POA: Diagnosis not present

## 2022-11-16 DIAGNOSIS — Z5900 Homelessness unspecified: Secondary | ICD-10-CM

## 2022-11-16 DIAGNOSIS — I509 Heart failure, unspecified: Secondary | ICD-10-CM | POA: Diagnosis not present

## 2022-11-16 DIAGNOSIS — Z1152 Encounter for screening for COVID-19: Secondary | ICD-10-CM

## 2022-11-16 DIAGNOSIS — R7881 Bacteremia: Secondary | ICD-10-CM | POA: Diagnosis present

## 2022-11-16 DIAGNOSIS — I132 Hypertensive heart and chronic kidney disease with heart failure and with stage 5 chronic kidney disease, or end stage renal disease: Secondary | ICD-10-CM | POA: Diagnosis not present

## 2022-11-16 DIAGNOSIS — Z8052 Family history of malignant neoplasm of bladder: Secondary | ICD-10-CM

## 2022-11-16 DIAGNOSIS — Z862 Personal history of diseases of the blood and blood-forming organs and certain disorders involving the immune mechanism: Secondary | ICD-10-CM | POA: Diagnosis not present

## 2022-11-16 DIAGNOSIS — I10 Essential (primary) hypertension: Secondary | ICD-10-CM | POA: Diagnosis not present

## 2022-11-16 DIAGNOSIS — N186 End stage renal disease: Secondary | ICD-10-CM

## 2022-11-16 DIAGNOSIS — B9562 Methicillin resistant Staphylococcus aureus infection as the cause of diseases classified elsewhere: Secondary | ICD-10-CM | POA: Diagnosis not present

## 2022-11-16 DIAGNOSIS — T50995A Adverse effect of other drugs, medicaments and biological substances, initial encounter: Secondary | ICD-10-CM | POA: Diagnosis not present

## 2022-11-16 DIAGNOSIS — E8779 Other fluid overload: Secondary | ICD-10-CM | POA: Diagnosis not present

## 2022-11-16 DIAGNOSIS — N189 Chronic kidney disease, unspecified: Secondary | ICD-10-CM | POA: Diagnosis not present

## 2022-11-16 DIAGNOSIS — E785 Hyperlipidemia, unspecified: Secondary | ICD-10-CM | POA: Diagnosis present

## 2022-11-16 DIAGNOSIS — E1122 Type 2 diabetes mellitus with diabetic chronic kidney disease: Secondary | ICD-10-CM | POA: Diagnosis not present

## 2022-11-16 DIAGNOSIS — N6342 Unspecified lump in left breast, subareolar: Secondary | ICD-10-CM

## 2022-11-16 DIAGNOSIS — R1110 Vomiting, unspecified: Secondary | ICD-10-CM | POA: Diagnosis present

## 2022-11-16 DIAGNOSIS — D631 Anemia in chronic kidney disease: Secondary | ICD-10-CM | POA: Diagnosis not present

## 2022-11-16 DIAGNOSIS — N6320 Unspecified lump in the left breast, unspecified quadrant: Secondary | ICD-10-CM | POA: Diagnosis present

## 2022-11-16 DIAGNOSIS — R0989 Other specified symptoms and signs involving the circulatory and respiratory systems: Secondary | ICD-10-CM | POA: Diagnosis not present

## 2022-11-16 DIAGNOSIS — R0902 Hypoxemia: Secondary | ICD-10-CM | POA: Diagnosis not present

## 2022-11-16 DIAGNOSIS — R21 Rash and other nonspecific skin eruption: Secondary | ICD-10-CM | POA: Diagnosis not present

## 2022-11-16 DIAGNOSIS — Z992 Dependence on renal dialysis: Secondary | ICD-10-CM

## 2022-11-16 DIAGNOSIS — I5022 Chronic systolic (congestive) heart failure: Secondary | ICD-10-CM | POA: Diagnosis present

## 2022-11-16 DIAGNOSIS — Z811 Family history of alcohol abuse and dependence: Secondary | ICD-10-CM

## 2022-11-16 DIAGNOSIS — T50996A Underdosing of other drugs, medicaments and biological substances, initial encounter: Secondary | ICD-10-CM | POA: Diagnosis present

## 2022-11-16 DIAGNOSIS — I952 Hypotension due to drugs: Secondary | ICD-10-CM | POA: Diagnosis not present

## 2022-11-16 LAB — CBC WITH DIFFERENTIAL/PLATELET
Abs Immature Granulocytes: 0.04 10*3/uL (ref 0.00–0.07)
Basophils Absolute: 0 10*3/uL (ref 0.0–0.1)
Basophils Relative: 0 %
Eosinophils Absolute: 0.1 10*3/uL (ref 0.0–0.5)
Eosinophils Relative: 1 %
HCT: 31.6 % — ABNORMAL LOW (ref 39.0–52.0)
Hemoglobin: 9.4 g/dL — ABNORMAL LOW (ref 13.0–17.0)
Immature Granulocytes: 0 %
Lymphocytes Relative: 11 %
Lymphs Abs: 1.3 10*3/uL (ref 0.7–4.0)
MCH: 26.4 pg (ref 26.0–34.0)
MCHC: 29.7 g/dL — ABNORMAL LOW (ref 30.0–36.0)
MCV: 88.8 fL (ref 80.0–100.0)
Monocytes Absolute: 1.2 10*3/uL — ABNORMAL HIGH (ref 0.1–1.0)
Monocytes Relative: 11 %
Neutro Abs: 9.2 10*3/uL — ABNORMAL HIGH (ref 1.7–7.7)
Neutrophils Relative %: 77 %
Platelets: 311 10*3/uL (ref 150–400)
RBC: 3.56 MIL/uL — ABNORMAL LOW (ref 4.22–5.81)
RDW: 16.6 % — ABNORMAL HIGH (ref 11.5–15.5)
WBC: 11.9 10*3/uL — ABNORMAL HIGH (ref 4.0–10.5)
nRBC: 0 % (ref 0.0–0.2)

## 2022-11-16 LAB — BASIC METABOLIC PANEL
Anion gap: 18 — ABNORMAL HIGH (ref 5–15)
BUN: 52 mg/dL — ABNORMAL HIGH (ref 6–20)
CO2: 24 mmol/L (ref 22–32)
Calcium: 8.2 mg/dL — ABNORMAL LOW (ref 8.9–10.3)
Chloride: 94 mmol/L — ABNORMAL LOW (ref 98–111)
Creatinine, Ser: 7.85 mg/dL — ABNORMAL HIGH (ref 0.61–1.24)
GFR, Estimated: 8 mL/min — ABNORMAL LOW (ref >60)
Glucose, Bld: 132 mg/dL — ABNORMAL HIGH (ref 70–99)
Potassium: 3.7 mmol/L (ref 3.5–5.1)
Sodium: 136 mmol/L (ref 135–145)

## 2022-11-16 LAB — CBG MONITORING, ED: Glucose-Capillary: 115 mg/dL — ABNORMAL HIGH (ref 70–99)

## 2022-11-16 MED ORDER — PROCHLORPERAZINE EDISYLATE 10 MG/2ML IJ SOLN: 5.0000 mg | Freq: Four times a day (QID) | INTRAMUSCULAR | Status: DC | PRN

## 2022-11-16 MED ORDER — IPRATROPIUM-ALBUTEROL 0.5-2.5 (3) MG/3ML IN SOLN
3.0000 mL | Freq: Four times a day (QID) | RESPIRATORY_TRACT | Status: DC
  Filled 2022-11-16: qty 3

## 2022-11-16 MED ORDER — HEPARIN SODIUM (PORCINE) 5000 UNIT/ML IJ SOLN
5000.0000 [IU] | Freq: Three times a day (TID) | INTRAMUSCULAR | Status: DC
  Administered 2022-11-16 – 2022-11-18 (×3): 5000 [IU] via SUBCUTANEOUS
  Filled 2022-11-16 (×6): qty 1

## 2022-11-16 MED ORDER — INSULIN ASPART 100 UNIT/ML IJ SOLN: 0.0000 [IU] | Freq: Every day | INTRAMUSCULAR | Status: DC

## 2022-11-16 MED ORDER — ACETAMINOPHEN 325 MG PO TABS: 650.0000 mg | ORAL_TABLET | Freq: Four times a day (QID) | ORAL | Status: DC | PRN

## 2022-11-16 MED ORDER — MELATONIN 5 MG PO TABS: 5.0000 mg | ORAL_TABLET | Freq: Every evening | ORAL | Status: DC | PRN

## 2022-11-16 MED ORDER — SODIUM CHLORIDE 0.9 % IV SOLN
2.0000 g | Freq: Once | INTRAVENOUS | Status: AC
  Administered 2022-11-17: 2 g via INTRAVENOUS
  Filled 2022-11-16: qty 12.5

## 2022-11-16 MED ORDER — GUAIFENESIN 100 MG/5ML PO LIQD
5.0000 mL | ORAL | Status: DC | PRN
  Administered 2022-11-21: 5 mL via ORAL
  Filled 2022-11-16: qty 15

## 2022-11-16 MED ORDER — POLYETHYLENE GLYCOL 3350 17 G PO PACK: 17.0000 g | PACK | Freq: Every day | ORAL | Status: DC | PRN

## 2022-11-16 MED ORDER — INSULIN ASPART 100 UNIT/ML IJ SOLN
0.0000 [IU] | Freq: Three times a day (TID) | INTRAMUSCULAR | Status: DC
  Administered 2022-11-21: 1 [IU] via SUBCUTANEOUS

## 2022-11-16 NOTE — H&P (Addendum)
History and Physical  [name] KGM:[id number] DOB: 23-Mar-1971 DOA: 11/16/2022  Referring physician: Dr. Charm Barges, EDP  PCP: [name]  Outpatient Specialists: Nephrology. Patient coming from: Homeless  Chief Complaint: Shortness of breath  HPI: [name] is a 52 y.o. 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  Admitted for MRSA bacteremia and discharged on 10/26/2022, who presented via EMS due to progressive shortness of breath throughout the course of the day.  EMS was activated.  Upon arrival the patient was hypoxic with O2 saturation of 85% on room air.  He was not wearing oxygen.  Was placed on 4 L nasal cannula by EMS with improvement of his O2 saturation up to 100%.  The patient missed hemodialysis today.  He was brought into the ED for further evaluation.  In the ED, somnolent, arouses to voices and not cooperative.  Not answering questions.  Vital signs notable for low-grade fever 99.5 with mild leukocytosis 11.9K.  O2 saturation improved on 4 L.  Chest x-ray revealed changes consistent with multifocal infiltrates on the right.  Also changes of mild CHF.  Due to concern for right-sided pneumonia, EDP requested admission for further management.  The patient was admitted by [facility], hospitalist service, to telemetry medical unit as inpatient status.  Started on IV vancomycin and cefepime.  Nephrology consulted by EDP to resume hemodialysis.  ED Course: Tmax 99.5.  BP 142/82, pulse 80, respiration rate 18, O2 saturation 100% on 4 L.  Lab studies remarkable for WBC 11.9, hemoglobin 9.4, platelet count 311.  Serum glucose 132, serum bicarb 24, potassium 3.7, anion gap 18, BUN 52, creatinine 7.85.  GFR  8.  Review of Systems: Review of systems as noted in the HPI. All other systems reviewed and are negative.   Past Medical History:  Diagnosis Date   Anxiety 02/2019   Anxiety 10/2019   Diabetes mellitus type II, uncontrolled 07/17/2006   Elevated alkaline phosphatase level 01/2019   ESSENTIAL HYPERTENSION 07/17/2006   Homelessness 10/13/2021   HYPERLIPIDEMIA 11/24/2008   Insomnia 02/2019   Left bundle branch block 02/22/2019   Lesion of lip 10/2019   Pneumonia 10/22/2011   Suicidal ideations 02/2019   Vitamin D deficiency 01/2019   Past Surgical History:  Procedure Laterality Date   ARM SURGERY     AV FISTULA PLACEMENT Right 02/20/2021   Procedure: RIGHT ARTERIOVENOUS GRAFT CREATION;  Surgeon: Chuck Hint, MD;  Location: [facility] OR;  Service: Vascular;  Laterality: Right;   INSERTION OF DIALYSIS CATHETER Left 02/20/2021   Procedure: INSERTION OF DIALYSIS CATHETER USING PALINDROME CATHETER;  Surgeon: Chuck Hint, MD;  Location: [facility] OR;  Service: Vascular;  Laterality: Left;   IR AV DIALY SHUNT INTRO NEEDLE/INTRACATH INITIAL W/PTA/IMG RIGHT Right 11/06/2021   IR FLUORO GUIDE CV LINE RIGHT  09/12/2022   IR REMOVAL TUN CV CATH W/O FL  07/27/2021   IR THROMBECTOMY AV FISTULA W/THROMBOLYSIS INC/SHUNT/IMG RIGHT  09/11/2022   IR US GUIDE VASC ACCESS RIGHT  11/06/2021   IR US GUIDE VASC ACCESS RIGHT  09/11/2022   IR US GUIDE VASC ACCESS RIGHT  09/12/2022   TEE WITHOUT CARDIOVERSION N/A 10/25/2022   Procedure: TRANSESOPHAGEAL ECHOCARDIOGRAM;  Surgeon: Chilton Si, MD;  Location: [facility] INVASIVE  CV LAB;  Service: Cardiovascular;  Laterality: N/A;   ULTRASOUND GUIDANCE FOR VASCULAR ACCESS  02/20/2021   Procedure: ULTRASOUND GUIDANCE FOR VASCULAR ACCESS;  Surgeon: Chuck Hint, MD;  Location: [facility] OR;  Service: Vascular;;    Social History:  reports that he has never smoked. He has never used smokeless tobacco. He reports that he does not drink alcohol and does not use  drugs.   Allergies  Allergen Reactions   Sulfa Antibiotics Rash    Severe rash.  Severe rash.     Severe rash.   Other Other (See Comments)    Patient states he is allergic to the TB test - unknown reaction during childhood    Tuberculin Ppd     Other Reaction(s): Other (See Comments)   Bactrim [Sulfamethoxazole-Trimethoprim] Rash   Cephalexin Other (See Comments) and Rash    Patient with severe drug reaction including exfoliating skin rash and hypotension after being prescribed both cephalexin and sulfamethoxazole-trimethoprim simultaneously. Favor SMX as much more likely culprit as patient had tolerated other cephalosporins in the past, but cannot say with complete certainty that this was not related to cephalexin.  Other Reaction(s): Other, Other (See Comments)  Patient with severe drug reaction including exfoliating skin rash and hypotension after being prescribed both cephalexin and sulfamethoxazole-trimethoprim simultaneously. Favor SMX as much more likely culprit as patient had tolerated other cephalosporins in the past, but cannot say with complete certainty that this was not related to cephalexin.   "Patient with severe drug reaction including exfoliating skin rash and hypotension after being prescribed both cephalexin and sulfamethoxazole-trimethoprim simultaneously. Favor SMX as much more likely culprit as patient had tolerated other cephalosporins in the past, but cannot say with complete certainty that this was not related to cephalexin." As noted in [facility]   "Patient with severe drug reaction including exfoliating skin rash and hypotension after being prescribed both cephalexin and sulfamethoxazole-trimethoprim simultaneously. Favor SMX as much more likely culprit as patient had tolerated other cephalosporins in the past, but cannot say with complete certainty that this was not related to cephalexin." As noted in [facility]   "Patient with severe drug reaction including  exfoliating skin rash and hypotension after being prescribed both cephalexin and sulfamethoxazole-trimethoprim simultaneously. Favor SMX as much more likely culprit as patient had tolerated other cephalosporins in the past, but cannot say with complete certainty that this was not related to cephalexin." As noted in [facility]     Patient with severe drug reaction including exfoliating skin rash and hypotension after being prescribed both cephalexin and sulfamethoxazole-trimethoprim simultaneously. Favor SMX as much more likely culprit as patient had tolerated other cephalosporins in the past, but cannot say with complete certainty that this was not related to cephalexin.  "Patient with severe drug reaction including exfoliating skin rash and hypotension after being prescribed both cephalexin and sulfamethoxazole-trimethoprim simultaneously. Favor SMX as much more likely culprit as patient had tolerated other cephalosporins in the past, but cannot say with complete certainty that this was not related to cephalexin." As noted in [facility]  "Patient with severe drug reaction including exfoliating skin rash and hypotension after being prescribed both cephalexin and sulfamethoxazole-trimethoprim simultaneously. Favor SMX as much more likely culprit as patient had tolerated other cephalosporins in the past, but cannot say with complete certainty that this was not related to cephalexin." As noted in [facility]     Patient with severe drug reaction inc... (TRUNCATED)    Family History  Problem Relation Age of Onset  Hypertension Mother 54   Cancer Mother        bladder cancer   Alcohol abuse Father    Hypertension Brother       Prior to Admission medications   Medication Sig Start Date End Date Taking? Authorizing Provider  acetaminophen (TYLENOL) 500 MG tablet Take 1 tablet (500 mg total) by mouth every 8 (eight) hours as needed for mild pain. 05/28/22   Leroy Sea, MD  ASPIRIN LOW DOSE 81 MG  tablet Take 1 tablet (81 mg total) by mouth daily. 05/28/22   Leroy Sea, MD  benzonatate (TESSALON) 100 MG capsule Take 1 capsule (100 mg total) by mouth every 8 (eight) hours. 09/09/22   Sabas Sous, MD  calcitRIOL (ROCALTROL) 0.25 MCG capsule Take 1 capsule (0.25 mcg total) by mouth every Tuesday, Thursday, and Saturday at 6 PM. 10/26/22   Ghimire, Werner Lean, MD  calcium acetate (PHOSLO) 667 MG capsule Take 3 capsules (2,001 mg total) by mouth 3 (three) times daily. 05/28/22   Leroy Sea, MD  carvedilol (COREG) 25 MG tablet Take 1 tablet (25 mg total) by mouth 2 (two) times daily with a meal. 10/26/22   Ghimire, Werner Lean, MD  Fingerstix Lancets MISC Use as directed up to 4 times daily. 05/28/22   Leroy Sea, MD  glucose blood test strip Use to check fasting blood sugar once daily. 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 Insulin dependent 11/29/20   Hollice Espy, MD  glucose blood test strip Use as directed up to 4 times daily 05/28/22   Leroy Sea, MD  insulin aspart (NOVOLOG FLEXPEN) 100 UNIT/ML FlexPen 0-9 Units, Subcutaneous, 3 times daily with meals CBG < 70: Implement Hypoglycemia measures CBG 70 - 120: 0 units CBG 121 - 150: 1 unit CBG 151 - 200: 2 units CBG 201 - 250: 3 units CBG 251 - 300: 5 units CBG 301 - 350: 7 units CBG 351 - 400: 9 units CBG > 400: call MD 10/26/22   Maretta Bees, MD  insulin glargine-yfgn (SEMGLEE) 100 UNIT/ML injection Inject 0.07 mLs (7 Units total) into the skin at bedtime. 10/26/22   Ghimire, Werner Lean, MD  Insulin Pen Needle 32G X 4 MM MISC Use with insulin pens 05/28/22   Leroy Sea, MD  Insulin Syringe-Needle U-100 25G X 1" 1 ML MISC For 4 times a day insulin SQ, 1 month supply. Diagnosis E11.65 05/28/22   Leroy Sea, MD  isosorbide mononitrate (IMDUR) 60 MG 24 hr tablet Take 1 tablet (60 mg total) by mouth daily. 10/26/22   Ghimire, Werner Lean, MD  melatonin 3 MG TABS tablet Take 1 tablet (3 mg total) by mouth at bedtime as needed (insomnia). 10/26/22    Ghimire, Werner Lean, MD  oxyCODONE-acetaminophen (PERCOCET/ROXICET) 5-325 MG tablet Take 1 tablet by mouth every 12 (twelve) hours as needed for severe pain. 10/26/22   Ghimire, Werner Lean, MD  PROTONIX 40 MG tablet Take 1 tablet (40 mg total) by mouth daily. 05/25/22   Leroy Sea, MD  vancomycin IVPB Inject 750 mg into the vein Every Tuesday,Thursday,and Saturday with dialysis. Indication:  MRSA bacteremia First Dose: Yes Last Day of Therapy:  11/27/22 Labs - Once weekly: ESR and CRP, CBC, BMP, vancomycin trough Method of administration: Per hemodialysis protocol at the HD center Method of administration may be changed at the discretion of the patient and/or caregiver's ability to self-administer the medication ordered. 10/26/22 11/27/22  Randall Hiss, MD  gabapentin (NEURONTIN) 300 MG capsule Take 1 capsule (300 mg total) by mouth 3 (three) times daily. Patient not taking: Reported on 07/27/2019 07/29/18 08/30/19  Kallie Locks, FNP  sildenafil (VIAGRA) 25 MG tablet Take 1 tablet (25 mg total) by mouth as needed for erectile dysfunction. for erectile dysfunction. Patient not taking: Reported on 08/11/2020 10/13/19 11/27/20  Kallie Locks, FNP    Physical Exam: BP (!) 142/82 (BP Location: Left Arm)   Pulse 80   Temp 99.5 F (37.5 C) (Oral)   Resp 18   Ht 5\' 7"  (1.702 m)   Wt 70.5 kg   SpO2 100%   BMI 24.34 kg/m   General: 52 y.o. year-old male well developed well nourished in no acute distress.  Somnolent and arouses to voices.  Non cooperative. Cardiovascular: Regular rate and rhythm with no rubs or gallops.  No thyromegaly or JVD noted.  + lower extremity edema bilaterally Respiratory: Mild rales at bases with no wheezing noted. Poor inspiratory effort. Abdomen: Soft nontender nondistended with normal bowel sounds x4 quadrants. Muskuloskeletal: No cyanosis or clubbing noted bilaterally Neuro: CN II-XII intact, strength, sensation, reflexes Skin: Very dry and scaly skin in  exposed areas, worse in his lower extremities. Psychiatry: Mood is irritable for condition and setting          Labs on Admission:  Basic Metabolic Panel: Recent Labs  Lab 11/14/22 0056 11/16/22 2129  NA 138 136  K 4.0 3.7  CL 100 94*  CO2  --  24  GLUCOSE 167* 132*  BUN 39* 52*  CREATININE 6.90* 7.85*  CALCIUM  --  8.2*   Liver Function Tests: No results for input(s): "AST", "ALT", "ALKPHOS", "BILITOT", "PROT", "ALBUMIN" in the last 168 hours. No results for input(s): "LIPASE", "AMYLASE" in the last 168 hours. No results for input(s): "AMMONIA" in the last 168 hours. CBC: Recent Labs  Lab 11/14/22 0056 11/16/22 2129  WBC  --  11.9*  NEUTROABS  --  9.2*  HGB 12.9* 9.4*  HCT 38.0* 31.6*  MCV  --  88.8  PLT  --  311   Cardiac Enzymes: No results for input(s): "CKTOTAL", "CKMB", "CKMBINDEX", "TROPONINI" in the last 168 hours.  BNP (last 3 results) Recent Labs    05/25/22 0820 08/24/22 2151 09/08/22 0340  BNP 3,829.2* >4,500.0* >4,500.0*    ProBNP (last 3 results) No results for input(s): "PROBNP" in the last 8760 hours.  CBG: No results for input(s): "GLUCAP" in the last 168 hours.  Radiological Exams on Admission: DG Chest Port 1 View  Result Date: 11/16/2022 CLINICAL DATA:  Shortness of breath EXAM: PORTABLE CHEST 1 VIEW COMPARISON:  11/12/2022 FINDINGS: Cardiac shadow remains enlarged. Central vascular congestion is again identified. Patchy airspace opacity is noted in the right upper lobe along the minor fissure as well as in the medial aspect of the right lower lobe. Left lung remains clear. IMPRESSION: Changes consistent with multifocal infiltrate on the right. Changes of mild CHF. Electronically Signed   By: Alcide Clever M.D.   On: 11/16/2022 20:43    EKG: I independently viewed the EKG done and my findings are as followed: Sinus rhythm rate of 80.  Nonspecific ST-T changes.  QTc 528.  Assessment/Plan Present on Admission:  CAP (community acquired  pneumonia)  Active Problems:   CAP (community acquired pneumonia)  Right-sided pneumonia, POA Personally reviewed chest x-ray done on admission which shows right middle and right lower lobe pulmonary infiltrates suggestive of pneumonia Mildly elevated WBC 11.9K,  low-grade fever 99.5 Started on IV vancomycin and cefepime DuoNebs every 6 hours Antitussives as needed Repeat CBC and obtain baseline procalcitonin level in the morning  Acute hypoxic respiratory failure secondary to the above Unclear baseline oxygen requirement. Was found to be hypoxic upon EMS arrival with O2 saturation of 85% Currently on 4 L nasal cannula with O2 saturation of 99 to 100% Continue to maintain O2 saturation above 92%  ESRD on HD TTS Missed his hemodialysis session on Saturday 11/16/2022. Last hemodialysis session was on Thursday 11/14/2022. Nephrology consulted by EDP.  Possible hemodialysis 11/17/2022. Volume status and electrolytes managed with hemodialysis.  QTc prolongation QTc on admission 12-lead EKG 528 Avoid QTc prolonging agents Optimize magnesium and potassium levels Electrolytes mainly managed with hemodialysis Monitor on telemetry.  Recent admission for MRSA bacteremia, TEE 10/25/2022 with no evidence of vegetation or infective endocarditis. Patient is currently on IV vancomycin and followed by infectious disease outpatient IV vancomycin at hemodialysis Tuesday Thursday and Saturday, last day of therapy 11/27/2022. ID recommended once weekly ESR, CRP, CBC, BMP, vancomycin trough Continue IV vancomycin as recommended by infectious disease.  Pharmacy consulted to assist with dosing.  Chronic HFrEF 20 to 25% Last 2D echo done on 10/22/2022 revealed LVEF 20 to 25% with left ventricle global hypokinesis Last BNP was greater than 4500 on 09/08/2022. Mild pulmonary edema after missing hemodialysis today, lower extremity edema. Resume home cardiac medications as blood pressure tolerates Start strict  I's and O's and daily weight Volume status managed with hemodialysis  Hypertension BP is currently stable Slowly resume home oral antihypertensives as blood pressure allows. Continue to monitor vital signs and avoid hypotension, maintain MAP greater than 65.  Type 2 diabetes with hyperglycemia Last hemoglobin A1c was 7.7 on 09/08/2022 Start renal carb modified diet Start insulin sliding scale Avoid overcorrection of hyperglycemia in the setting of ESRD. Avoid hypoglycemia.  Anemia of chronic disease No reported overt bleeding Currently hemoglobin remains at 9.4K Continue to monitor H&H.  Homelessness TOC consulted to assist with disposition   Time: 75 minutes   DVT prophylaxis: Subcu heparin 3 times daily  Code Status: Full code  Family Communication: None at bedside  Disposition Plan: Admitted to telemetry medical unit  Consults called: Nephrology consulted by EDP  Admission status: Inpatient status.   Status is: Inpatient The patient requires at least 2 midnights for further evaluation and treatment of present condition   Darlin Drop MD [facility] Pager [phone]  If 7PM-7AM, please contact night-coverage [email or web address] Password [facility]  11/16/2022, 11:03 PM

## 2022-11-16 NOTE — ED Triage Notes (Signed)
Pt c/o SOB, room air 85%, supposed to be on 4L [place] continuous.  EMS reports pt wasn't wearing his oxygen. EMS placed pt on 4L [place] & O2 saturation came up to 100%. Pt missed dialysis today as well.  EMS placed pt on 4 L

## 2022-11-16 NOTE — ED Provider Notes (Signed)
[place] EMERGENCY DEPARTMENT AT [facility] Provider Note   CSN: [id number] Arrival date & time: 11/16/22  2006     History {Add pertinent medical, surgical, social history, OB history to HPI:1} Chief Complaint  Patient presents with   Shortness of Breath    [name] is a 52 y.o. male.  He has a history of diabetes, end-stage renal disease.  Last dialysis was Thursday.  He is complaining of shortness of breath it has been going on all day today.  He is supposed to be on 4 L nasal cannula but is on the streets so does not have access to his oxygen.  No cough no fever.  Does also have a lump on his left breast that he wanted looked at.  He said that it has been there a while.  Vomited yesterday.  Says he usually gets his dialysis on [employer] of Breath Severity:  Moderate Onset quality:  Gradual Duration:  1 day Timing:  Constant Progression:  Improving Chronicity:  Recurrent Relieved by:  Nothing Worsened by:  Activity Ineffective treatments:  None tried Associated symptoms: cough and vomiting   Associated symptoms: no abdominal pain, no chest pain, no fever, no hemoptysis and no sputum production        Home Medications Prior to Admission medications   Medication Sig Start Date End Date Taking? Authorizing Provider  acetaminophen (TYLENOL) 500 MG tablet Take 1 tablet (500 mg total) by mouth every 8 (eight) hours as needed for mild pain. 05/28/22   Leroy Sea, MD  ASPIRIN LOW DOSE 81 MG tablet Take 1 tablet (81 mg total) by mouth daily. 05/28/22   Leroy Sea, MD  benzonatate (TESSALON) 100 MG capsule Take 1 capsule (100 mg total) by mouth every 8 (eight) hours. 09/09/22   Sabas Sous, MD  calcitRIOL (ROCALTROL) 0.25 MCG capsule Take 1 capsule (0.25 mcg total) by mouth every Tuesday, Thursday, and Saturday at 6 PM. 10/26/22   Ghimire, Werner Lean, MD  calcium acetate (PHOSLO) 667 MG capsule Take 3 capsules (2,001 mg total) by mouth 3  (three) times daily. 05/28/22   Leroy Sea, MD  carvedilol (COREG) 25 MG tablet Take 1 tablet (25 mg total) by mouth 2 (two) times daily with a meal. 10/26/22   Ghimire, Werner Lean, MD  Fingerstix Lancets MISC Use as directed up to 4 times daily. 05/28/22   Leroy Sea, MD  glucose blood test strip Use to check fasting blood sugar once daily. 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 Insulin dependent 11/29/20   Hollice Espy, MD  glucose blood test strip Use as directed up to 4 times daily 05/28/22   Leroy Sea, MD  insulin aspart (NOVOLOG FLEXPEN) 100 UNIT/ML FlexPen 0-9 Units, Subcutaneous, 3 times daily with meals CBG < 70: Implement Hypoglycemia measures CBG 70 - 120: 0 units CBG 121 - 150: 1 unit CBG 151 - 200: 2 units CBG 201 - 250: 3 units CBG 251 - 300: 5 units CBG 301 - 350: 7 units CBG 351 - 400: 9 units CBG > 400: call MD 10/26/22   Maretta Bees, MD  insulin glargine-yfgn (SEMGLEE) 100 UNIT/ML injection Inject 0.07 mLs (7 Units total) into the skin at bedtime. 10/26/22   Ghimire, Werner Lean, MD  Insulin Pen Needle 32G X 4 MM MISC Use with insulin pens 05/28/22   Leroy Sea, MD  Insulin Syringe-Needle U-100 25G X 1" 1 ML MISC For 4  times a day insulin SQ, 1 month supply. Diagnosis E11.65 05/28/22   Leroy Sea, MD  isosorbide mononitrate (IMDUR) 60 MG 24 hr tablet Take 1 tablet (60 mg total) by mouth daily. 10/26/22   Ghimire, Werner Lean, MD  melatonin 3 MG TABS tablet Take 1 tablet (3 mg total) by mouth at bedtime as needed (insomnia). 10/26/22   Ghimire, Werner Lean, MD  oxyCODONE-acetaminophen (PERCOCET/ROXICET) 5-325 MG tablet Take 1 tablet by mouth every 12 (twelve) hours as needed for severe pain. 10/26/22   Ghimire, Werner Lean, MD  PROTONIX 40 MG tablet Take 1 tablet (40 mg total) by mouth daily. 05/25/22   Leroy Sea, MD  vancomycin IVPB Inject 750 mg into the vein Every Tuesday,Thursday,and Saturday with dialysis. Indication:  MRSA bacteremia First Dose: Yes Last Day of Therapy:   11/27/22 Labs - Once weekly: ESR and CRP, CBC, BMP, vancomycin trough Method of administration: Per hemodialysis protocol at the HD center Method of administration may be changed at the discretion of the patient and/or caregiver's ability to self-administer the medication ordered. 10/26/22 11/27/22  Randall Hiss, MD  gabapentin (NEURONTIN) 300 MG capsule Take 1 capsule (300 mg total) by mouth 3 (three) times daily. Patient not taking: Reported on 07/27/2019 07/29/18 08/30/19  Kallie Locks, FNP  sildenafil (VIAGRA) 25 MG tablet Take 1 tablet (25 mg total) by mouth as needed for erectile dysfunction. for erectile dysfunction. Patient not taking: Reported on 08/11/2020 10/13/19 11/27/20  Kallie Locks, FNP      Allergies    Sulfa antibiotics, Other, Tuberculin ppd, Bactrim [sulfamethoxazole-trimethoprim], and Cephalexin    Review of Systems   Review of Systems  Constitutional:  Negative for fever.  Respiratory:  Positive for cough and shortness of breath. Negative for hemoptysis and sputum production.   Cardiovascular:  Negative for chest pain.  Gastrointestinal:  Positive for vomiting. Negative for abdominal pain.    Physical Exam Updated Vital Signs BP (!) 142/82 (BP Location: Left Arm)   Pulse 80   Temp 99.5 F (37.5 C) (Oral)   Resp 18   Ht 5\' 7"  (1.702 m)   Wt 70.5 kg   SpO2 100%   BMI 24.34 kg/m  Physical Exam Vitals and nursing note reviewed.  Constitutional:      General: He is not in acute distress.    Appearance: He is well-developed.  HENT:     Head: Normocephalic and atraumatic.  Eyes:     Conjunctiva/sclera: Conjunctivae normal.  Cardiovascular:     Rate and Rhythm: Normal rate and regular rhythm.     Heart sounds: Murmur (SEM) heard.  Pulmonary:     Effort: Pulmonary effort is normal. No respiratory distress.     Breath sounds: Normal breath sounds.  Chest:  Breasts:    Left: Mass present.     Comments: Approximately 3 cm mobile mass under left  areola.  No overlying skin changes. Abdominal:     Palpations: Abdomen is soft.     Tenderness: There is no abdominal tenderness.  Musculoskeletal:        General: No swelling.     Cervical back: Neck supple.     Right lower leg: Edema present.     Left lower leg: Edema present.     Comments: Fistula right forearm with positive thrill  Skin:    General: Skin is warm and dry.     Capillary Refill: Capillary refill takes less than 2 seconds.  Neurological:  General: No focal deficit present.     Mental Status: He is alert.     ED Results / Procedures / Treatments   Labs (all labs ordered are listed, but only abnormal results are displayed) Labs Reviewed  BASIC METABOLIC PANEL  CBC WITH DIFFERENTIAL/PLATELET    EKG None  Radiology No results found.  Procedures Procedures  {Document cardiac monitor, telemetry assessment procedure when appropriate:1}  Medications Ordered in ED Medications - No data to display  ED Course/ Medical Decision Making/ A&P   {   Click here for ABCD2, HEART and other calculatorsREFRESH Note before signing :1}                          Medical Decision Making Amount and/or Complexity of Data Reviewed Labs: ordered. Radiology: ordered.   ***  {Document critical care time when appropriate:1} {Document review of labs and clinical decision tools ie heart score, Chads2Vasc2 etc:1}  {Document your independent review of radiology images, and any outside records:1} {Document your discussion with family members, caretakers, and with consultants:1} {Document social determinants of health affecting pt's care:1} {Document your decision making why or why not admission, treatments were needed:1} Final Clinical Impression(s) / ED Diagnoses Final diagnoses:  None    Rx / DC Orders ED Discharge Orders     None

## 2022-11-16 NOTE — ED Notes (Signed)
ED TO INPATIENT HANDOFF REPORT  ED Nurse Name and Phone #: Morrie Sheldon [phone]  S Name/Age/Gender [name] Haughton 52 y.o. male Room/Bed: 028C/028C  Code Status   Code Status: Prior  Home/SNF/Other Homeless Patient oriented to: self, place, time, and situation Is this baseline? Yes   Triage Complete: Triage complete  Chief Complaint CAP (community acquired pneumonia) [J18.9]  Triage Note Pt c/o SOB, room air 85%, supposed to be on 4L [place] continuous.  EMS reports pt wasn't wearing his oxygen. EMS placed pt on 4L [place] & O2 saturation came up to 100%. Pt missed dialysis today as well.  EMS placed pt on 4 L      Allergies Allergies  Allergen Reactions   Sulfa Antibiotics Rash    Severe rash.  Severe rash.     Severe rash.   Other Other (See Comments)    Patient states he is allergic to the TB test - unknown reaction during childhood    Tuberculin Ppd     Other Reaction(s): Other (See Comments)   Bactrim [Sulfamethoxazole-Trimethoprim] Rash   Cephalexin Other (See Comments) and Rash    Patient with severe drug reaction including exfoliating skin rash and hypotension after being prescribed both cephalexin and sulfamethoxazole-trimethoprim simultaneously. Favor SMX as much more likely culprit as patient had tolerated other cephalosporins in the past, but cannot say with complete certainty that this was not related to cephalexin.  Other Reaction(s): Other, Other (See Comments)  Patient with severe drug reaction including exfoliating skin rash and hypotension after being prescribed both cephalexin and sulfamethoxazole-trimethoprim simultaneously. Favor SMX as much more likely culprit as patient had tolerated other cephalosporins in the past, but cannot say with complete certainty that this was not related to cephalexin.   "Patient with severe drug reaction including exfoliating skin rash and hypotension after being prescribed both cephalexin and sulfamethoxazole-trimethoprim  simultaneously. Favor SMX as much more likely culprit as patient had tolerated other cephalosporins in the past, but cannot say with complete certainty that this was not related to cephalexin." As noted in [facility]   "Patient with severe drug reaction including exfoliating skin rash and hypotension after being prescribed both cephalexin and sulfamethoxazole-trimethoprim simultaneously. Favor SMX as much more likely culprit as patient had tolerated other cephalosporins in the past, but cannot say with complete certainty that this was not related to cephalexin." As noted in [facility]   "Patient with severe drug reaction including exfoliating skin rash and hypotension after being prescribed both cephalexin and sulfamethoxazole-trimethoprim simultaneously. Favor SMX as much more likely culprit as patient had tolerated other cephalosporins in the past, but cannot say with complete certainty that this was not related to cephalexin." As noted in [facility]     Patient with severe drug reaction including exfoliating skin rash and hypotension after being prescribed both cephalexin and sulfamethoxazole-trimethoprim simultaneously. Favor SMX as much more likely culprit as patient had tolerated other cephalosporins in the past, but cannot say with complete certainty that this was not related to cephalexin.  "Patient with severe drug reaction including exfoliating skin rash and hypotension after being prescribed both cephalexin and sulfamethoxazole-trimethoprim simultaneously. Favor SMX as much more likely culprit as patient had tolerated other cephalosporins in the past, but cannot say with complete certainty that this was not related to cephalexin." As noted in [facility]  "Patient with severe drug reaction including exfoliating skin rash and hypotension after being prescribed both cephalexin and sulfamethoxazole-trimethoprim simultaneously. Favor SMX as much more likely culprit as patient had  tolerated other  cephalosporins in the past, but cannot say with complete certainty that this was not related to cephalexin." As noted in [facility]     Patient with severe drug reaction inc... (TRUNCATED)    Level of Care/Admitting Diagnosis ED Disposition     ED Disposition  Admit   Condition  --   Comment  Hospital Area: [facility] [[id number]]  Level of Care: Telemetry Medical [104]  May admit patient to [facility] or [facility] if equivalent level of care is available:: No  Covid Evaluation: Asymptomatic - no recent exposure (last 10 days) testing not required  Diagnosis: CAP (community acquired pneumonia) [161096]  Admitting Physician: Darlin Drop [[id number]]  Attending Physician: Darlin Drop [[id number]]  Certification:: I certify this patient will need inpatient services for at least 2 midnights  Estimated Length of Stay: 2          B Medical/Surgery History Past Medical History:  Diagnosis Date   Anxiety 02/2019   Anxiety 10/2019   Diabetes mellitus type II, uncontrolled 07/17/2006   Elevated alkaline phosphatase level 01/2019   ESSENTIAL HYPERTENSION 07/17/2006   Homelessness 10/13/2021   HYPERLIPIDEMIA 11/24/2008   Insomnia 02/2019   Left bundle branch block 02/22/2019   Lesion of lip 10/2019   Pneumonia 10/22/2011   Suicidal ideations 02/2019   Vitamin D deficiency 01/2019   Past Surgical History:  Procedure Laterality Date   ARM SURGERY     AV FISTULA PLACEMENT Right 02/20/2021   Procedure: RIGHT ARTERIOVENOUS GRAFT CREATION;  Surgeon: Chuck Hint, MD;  Location: [facility] OR;  Service: Vascular;  Laterality: Right;   INSERTION OF DIALYSIS CATHETER Left 02/20/2021   Procedure: INSERTION OF DIALYSIS CATHETER USING PALINDROME CATHETER;  Surgeon: Chuck Hint, MD;  Location: [facility] OR;  Service: Vascular;  Laterality: Left;   IR AV DIALY SHUNT INTRO NEEDLE/INTRACATH INITIAL W/PTA/IMG RIGHT Right 11/06/2021   IR FLUORO GUIDE CV LINE RIGHT  09/12/2022    IR REMOVAL TUN CV CATH W/O FL  07/27/2021   IR THROMBECTOMY AV FISTULA W/THROMBOLYSIS INC/SHUNT/IMG RIGHT  09/11/2022   IR US GUIDE VASC ACCESS RIGHT  11/06/2021   IR US GUIDE VASC ACCESS RIGHT  09/11/2022   IR US GUIDE VASC ACCESS RIGHT  09/12/2022   TEE WITHOUT CARDIOVERSION N/A 10/25/2022   Procedure: TRANSESOPHAGEAL ECHOCARDIOGRAM;  Surgeon: Chilton Si, MD;  Location: [facility] INVASIVE CV LAB;  Service: Cardiovascular;  Laterality: N/A;   ULTRASOUND GUIDANCE FOR VASCULAR ACCESS  02/20/2021   Procedure: ULTRASOUND GUIDANCE FOR VASCULAR ACCESS;  Surgeon: Chuck Hint, MD;  Location: [facility] OR;  Service: Vascular;;     A IV Location/Drains/Wounds Patient Lines/Drains/Airways Status     Active Line/Drains/Airways     Name Placement date Placement time Site Days   Peripheral IV 11/16/22 20 G Anterior;Left Forearm 11/16/22  2119  Forearm  less than 1   Fistula / Graft Right Forearm --  --  Forearm  --   Wound / Incision (Open or Dehisced) 09/12/22 Skin tear Elbow Left 09/12/22  --  Elbow  65            Intake/Output Last 24 hours No intake or output data in the 24 hours ending 11/16/22 2258  Labs/Imaging Results for orders placed or performed during the hospital encounter of 11/16/22 (from the past 48 hour(s))  Basic metabolic panel     Status: Abnormal   Collection Time: 11/16/22  9:29 PM  Result Value Ref Range  Sodium 136 135 - 145 mmol/L   Potassium 3.7 3.5 - 5.1 mmol/L   Chloride 94 (L) 98 - 111 mmol/L   CO2 24 22 - 32 mmol/L   Glucose, Bld 132 (H) 70 - 99 mg/dL    Comment: Glucose reference range applies only to samples taken after fasting for at least 8 hours.   BUN 52 (H) 6 - 20 mg/dL   Creatinine, Ser 1.61 (H) 0.61 - 1.24 mg/dL   Calcium 8.2 (L) 8.9 - 10.3 mg/dL   GFR, Estimated 8 (L) >60 mL/min    Comment: (NOTE) Calculated using the CKD-EPI Creatinine Equation (2021)    Anion gap 18 (H) 5 - 15    Comment: Performed at [facility], [address], [place], [place] [address]  CBC with Differential     Status: Abnormal   Collection Time: 11/16/22  9:29 PM  Result Value Ref Range   WBC 11.9 (H) 4.0 - 10.5 K/uL   RBC 3.56 (L) 4.22 - 5.81 MIL/uL   Hemoglobin 9.4 (L) 13.0 - 17.0 g/dL   HCT 54.0 (L) 98.1 - 19.1 %   MCV 88.8 80.0 - 100.0 fL   MCH 26.4 26.0 - 34.0 pg   MCHC 29.7 (L) 30.0 - 36.0 g/dL   RDW 47.8 (H) 29.5 - 62.1 %   Platelets 311 150 - 400 K/uL   nRBC 0.0 0.0 - 0.2 %   Neutrophils Relative % 77 %   Neutro Abs 9.2 (H) 1.7 - 7.7 K/uL   Lymphocytes Relative 11 %   Lymphs Abs 1.3 0.7 - 4.0 K/uL   Monocytes Relative 11 %   Monocytes Absolute 1.2 (H) 0.1 - 1.0 K/uL   Eosinophils Relative 1 %   Eosinophils Absolute 0.1 0.0 - 0.5 K/uL   Basophils Relative 0 %   Basophils Absolute 0.0 0.0 - 0.1 K/uL   Immature Granulocytes 0 %   Abs Immature Granulocytes 0.04 0.00 - 0.07 K/uL    Comment: Performed at [facility], [address], [place], [place] [address]   DG Chest Port 1 View  Result Date: 11/16/2022 CLINICAL DATA:  Shortness of breath EXAM: PORTABLE CHEST 1 VIEW COMPARISON:  11/12/2022 FINDINGS: Cardiac shadow remains enlarged. Central vascular congestion is again identified. Patchy airspace opacity is noted in the right upper lobe along the minor fissure as well as in the medial aspect of the right lower lobe. Left lung remains clear. IMPRESSION: Changes consistent with multifocal infiltrate on the right. Changes of mild CHF. Electronically Signed   By: Alcide Clever M.D.   On: 11/16/2022 20:43    Pending Labs Unresulted Labs (From admission, onward)     Start     Ordered   11/16/22 2050  Culture, blood (routine x 2)  BLOOD CULTURE X 2,   R (with STAT occurrences)      11/16/22 2050            Vitals/Pain Today's Vitals   11/16/22 2009 11/16/22 2011 11/16/22 2012  BP:   (!) 142/82  Pulse:   80  Resp:   18  Temp:   99.5 F (37.5 C)  TempSrc:   Oral  SpO2: 100%  100%  Weight:  70.5 kg   Height:  5\' 7"   (1.702 m)   PainSc:  0-No pain     Isolation Precautions No active isolations  Medications Medications - No data to display  Mobility walks with device     Focused  Assessments See chart   R Recommendations: See Admitting Provider Note  Report given to:   Additional Notes: see chart

## 2022-11-17 DIAGNOSIS — R0602 Shortness of breath: Secondary | ICD-10-CM | POA: Diagnosis not present

## 2022-11-17 DIAGNOSIS — Z992 Dependence on renal dialysis: Secondary | ICD-10-CM | POA: Diagnosis not present

## 2022-11-17 DIAGNOSIS — N186 End stage renal disease: Secondary | ICD-10-CM | POA: Diagnosis not present

## 2022-11-17 DIAGNOSIS — J189 Pneumonia, unspecified organism: Secondary | ICD-10-CM | POA: Diagnosis not present

## 2022-11-17 DIAGNOSIS — E1122 Type 2 diabetes mellitus with diabetic chronic kidney disease: Secondary | ICD-10-CM | POA: Diagnosis not present

## 2022-11-17 LAB — RENAL FUNCTION PANEL
Albumin: 3.1 g/dL — ABNORMAL LOW (ref 3.5–5.0)
Anion gap: 19 — ABNORMAL HIGH (ref 5–15)
BUN: 56 mg/dL — ABNORMAL HIGH (ref 6–20)
CO2: 23 mmol/L (ref 22–32)
Calcium: 8.4 mg/dL — ABNORMAL LOW (ref 8.9–10.3)
Chloride: 95 mmol/L — ABNORMAL LOW (ref 98–111)
Creatinine, Ser: 8.22 mg/dL — ABNORMAL HIGH (ref 0.61–1.24)
GFR, Estimated: 7 mL/min — ABNORMAL LOW (ref >60)
Glucose, Bld: 112 mg/dL — ABNORMAL HIGH (ref 70–99)
Phosphorus: 5 mg/dL — ABNORMAL HIGH (ref 2.5–4.6)
Potassium: 4.1 mmol/L (ref 3.5–5.1)
Sodium: 137 mmol/L (ref 135–145)

## 2022-11-17 LAB — HEPATITIS B SURFACE ANTIGEN: Hepatitis B Surface Ag: NONREACTIVE

## 2022-11-17 LAB — MAGNESIUM: Magnesium: 2.4 mg/dL (ref 1.7–2.4)

## 2022-11-17 LAB — GLUCOSE, CAPILLARY
Glucose-Capillary: 121 mg/dL — ABNORMAL HIGH (ref 70–99)
Glucose-Capillary: 138 mg/dL — ABNORMAL HIGH (ref 70–99)
Glucose-Capillary: 137 mg/dL — ABNORMAL HIGH (ref 70–99)

## 2022-11-17 LAB — VANCOMYCIN, RANDOM: Vancomycin Rm: 13 ug/mL

## 2022-11-17 LAB — C-REACTIVE PROTEIN: CRP: 7.6 mg/dL — ABNORMAL HIGH (ref <1.0)

## 2022-11-17 LAB — PROCALCITONIN: Procalcitonin: 19.5 ng/mL

## 2022-11-17 MED ORDER — ASPIRIN 81 MG PO TBEC
81.0000 mg | DELAYED_RELEASE_TABLET | Freq: Every day | ORAL | Status: DC
  Administered 2022-11-17 – 2022-11-22 (×3): 81 mg via ORAL
  Filled 2022-11-17 (×4): qty 1

## 2022-11-17 MED ORDER — CHLORHEXIDINE GLUCONATE CLOTH 2 % EX PADS
6.0000 | MEDICATED_PAD | Freq: Every day | CUTANEOUS | Status: DC
  Administered 2022-11-21: 6 via TOPICAL

## 2022-11-17 MED ORDER — VANCOMYCIN HCL 750 MG/150ML IV SOLN
750.0000 mg | INTRAVENOUS | Status: DC
  Filled 2022-11-17 (×3): qty 150

## 2022-11-17 MED ORDER — MELATONIN 3 MG PO TABS: 3.0000 mg | ORAL_TABLET | Freq: Every evening | ORAL | Status: DC | PRN

## 2022-11-17 MED ORDER — VANCOMYCIN HCL 500 MG/100ML IV SOLN
500.0000 mg | Freq: Once | INTRAVENOUS | Status: DC
  Filled 2022-11-17 (×2): qty 100

## 2022-11-17 MED ORDER — SODIUM CHLORIDE 0.9 % IV SOLN
1.0000 g | INTRAVENOUS | Status: DC
  Administered 2022-11-18: 1 g via INTRAVENOUS
  Filled 2022-11-17 (×5): qty 10

## 2022-11-17 MED ORDER — ORAL CARE MOUTH RINSE: 15.0000 mL | OROMUCOSAL | Status: DC | PRN

## 2022-11-17 MED ORDER — IPRATROPIUM-ALBUTEROL 0.5-2.5 (3) MG/3ML IN SOLN: 3.0000 mL | Freq: Four times a day (QID) | RESPIRATORY_TRACT | Status: DC | PRN

## 2022-11-17 MED ORDER — CARVEDILOL 3.125 MG PO TABS
3.1250 mg | ORAL_TABLET | Freq: Two times a day (BID) | ORAL | Status: DC
  Administered 2022-11-17 – 2022-11-22 (×6): 3.125 mg via ORAL
  Filled 2022-11-17 (×7): qty 1

## 2022-11-17 NOTE — Progress Notes (Signed)
Patient still refuses to allow the telemetry box back on. He states he has to take a shower and then he will put it his telemetry on. Tech made aware and Clinical research associate will follow up. Charge nurse aware.

## 2022-11-17 NOTE — Progress Notes (Signed)
Pharmacy Antibiotic Note  [name] is a 52 y.o. male admitted on 11/16/2022 with pneumonia.  Pharmacy has been consulted for Vancomycin/Cefepime dosing. WBC 11.9. ESRD on HD TTS.   Pt has been on Vancomycin for MRSA bacteremia with a planned last day of therapy of 7/9 (6 weeks from negative culture on 5/29)  Ideally wanted to check a random vancomycin level this morning. Per RN, patient is currently refusing all labs.   6/30 AM Addendum:  Pt was agreeable to AM labs Vancomycin random level a little below goal at 13  Plan: -Will give vancomycin 500 mg IV x 1 now, then continue vancomycin 750 mg IV qHD TTS -Cefepime 1g IV q24h  Height: 5\' 7"  (170.2 cm) Weight: 72.4 kg (159 lb 9.6 oz) IBW/kg (Calculated) : 66.1  Temp (24hrs), Avg:98.6 F (37 C), Min:98.1 F (36.7 C), Max:99.5 F (37.5 C)  Recent Labs  Lab 11/14/22 0056 11/16/22 2129 11/17/22 0510  WBC  --  11.9*  --   CREATININE 6.90* 7.85* 8.22*  VANCORANDOM  --   --  13     Estimated Creatinine Clearance: 10.2 mL/min (A) (by C-G formula based on SCr of 8.22 mg/dL (H)).    Allergies  Allergen Reactions   Sulfa Antibiotics Rash    Severe rash.  Severe rash.     Severe rash.   Other Other (See Comments)    Patient states he is allergic to the TB test - unknown reaction during childhood    Tuberculin Ppd     Other Reaction(s): Other (See Comments)   Bactrim [Sulfamethoxazole-Trimethoprim] Rash   Cephalexin Other (See Comments) and Rash    Patient with severe drug reaction including exfoliating skin rash and hypotension after being prescribed both cephalexin and sulfamethoxazole-trimethoprim simultaneously. Favor SMX as much more likely culprit as patient had tolerated other cephalosporins in the past, but cannot say with complete certainty that this was not related to cephalexin.  Other Reaction(s): Other, Other (See Comments)  Patient with severe drug reaction including exfoliating skin rash and hypotension  after being prescribed both cephalexin and sulfamethoxazole-trimethoprim simultaneously. Favor SMX as much more likely culprit as patient had tolerated other cephalosporins in the past, but cannot say with complete certainty that this was not related to cephalexin.   "Patient with severe drug reaction including exfoliating skin rash and hypotension after being prescribed both cephalexin and sulfamethoxazole-trimethoprim simultaneously. Favor SMX as much more likely culprit as patient had tolerated other cephalosporins in the past, but cannot say with complete certainty that this was not related to cephalexin." As noted in [facility]   "Patient with severe drug reaction including exfoliating skin rash and hypotension after being prescribed both cephalexin and sulfamethoxazole-trimethoprim simultaneously. Favor SMX as much more likely culprit as patient had tolerated other cephalosporins in the past, but cannot say with complete certainty that this was not related to cephalexin." As noted in [facility]   "Patient with severe drug reaction including exfoliating skin rash and hypotension after being prescribed both cephalexin and sulfamethoxazole-trimethoprim simultaneously. Favor SMX as much more likely culprit as patient had tolerated other cephalosporins in the past, but cannot say with complete certainty that this was not related to cephalexin." As noted in [facility]     Patient with severe drug reaction including exfoliating skin rash and hypotension after being prescribed both cephalexin and sulfamethoxazole-trimethoprim simultaneously. Favor SMX as much more likely culprit as patient had tolerated other cephalosporins in the past, but cannot say with complete certainty  that this was not related to cephalexin.  "Patient with severe drug reaction including exfoliating skin rash and hypotension after being prescribed both cephalexin and sulfamethoxazole-trimethoprim simultaneously. Favor SMX as much more  likely culprit as patient had tolerated other cephalosporins in the past, but cannot say with complete certainty that this was not related to cephalexin." As noted in [facility]  "Patient with severe drug reaction including exfoliating skin rash and hypotension after being prescribed both cephalexin and sulfamethoxazole-trimethoprim simultaneously. Favor SMX as much more likely culprit as patient had tolerated other cephalosporins in the past, but cannot say with complete certainty that this was not related to cephalexin." As noted in [facility]     Patient with severe drug reaction inc... (TRUNCATED)    Abran Duke, PharmD, BCPS Clinical Pharmacist Phone: [phone]

## 2022-11-17 NOTE — Progress Notes (Addendum)
Pharmacy Antibiotic Note  [name] is a 52 y.o. male admitted on 11/16/2022 with pneumonia.  Pharmacy has been consulted for Vancomycin/Cefepime dosing. WBC 11.9. ESRD on HD TTS.   Pt has been on Vancomycin for MRSA bacteremia with a planned last day of therapy of 7/9 (6 weeks from negative culture on 5/29)  Ideally wanted to check a random vancomycin level this morning. Per RN, patient is currently refusing all labs.   Plan: Cont vancomycin 750 mg IV qHD TTS Check vancomycin level when possible Cefepime 1g IV q24h  Height: 5\' 7"  (170.2 cm) Weight: 70.5 kg (155 lb 6.8 oz) IBW/kg (Calculated) : 66.1  Temp (24hrs), Avg:98.8 F (37.1 C), Min:98.1 F (36.7 C), Max:99.5 F (37.5 C)  Recent Labs  Lab 11/14/22 0056 11/16/22 2129  WBC  --  11.9*  CREATININE 6.90* 7.85*    Estimated Creatinine Clearance: 10.6 mL/min (A) (by C-G formula based on SCr of 7.85 mg/dL (H)).    Allergies  Allergen Reactions   Sulfa Antibiotics Rash    Severe rash.  Severe rash.     Severe rash.   Other Other (See Comments)    Patient states he is allergic to the TB test - unknown reaction during childhood    Tuberculin Ppd     Other Reaction(s): Other (See Comments)   Bactrim [Sulfamethoxazole-Trimethoprim] Rash   Cephalexin Other (See Comments) and Rash    Patient with severe drug reaction including exfoliating skin rash and hypotension after being prescribed both cephalexin and sulfamethoxazole-trimethoprim simultaneously. Favor SMX as much more likely culprit as patient had tolerated other cephalosporins in the past, but cannot say with complete certainty that this was not related to cephalexin.  Other Reaction(s): Other, Other (See Comments)  Patient with severe drug reaction including exfoliating skin rash and hypotension after being prescribed both cephalexin and sulfamethoxazole-trimethoprim simultaneously. Favor SMX as much more likely culprit as patient had tolerated other  cephalosporins in the past, but cannot say with complete certainty that this was not related to cephalexin.   "Patient with severe drug reaction including exfoliating skin rash and hypotension after being prescribed both cephalexin and sulfamethoxazole-trimethoprim simultaneously. Favor SMX as much more likely culprit as patient had tolerated other cephalosporins in the past, but cannot say with complete certainty that this was not related to cephalexin." As noted in [facility]   "Patient with severe drug reaction including exfoliating skin rash and hypotension after being prescribed both cephalexin and sulfamethoxazole-trimethoprim simultaneously. Favor SMX as much more likely culprit as patient had tolerated other cephalosporins in the past, but cannot say with complete certainty that this was not related to cephalexin." As noted in [facility]   "Patient with severe drug reaction including exfoliating skin rash and hypotension after being prescribed both cephalexin and sulfamethoxazole-trimethoprim simultaneously. Favor SMX as much more likely culprit as patient had tolerated other cephalosporins in the past, but cannot say with complete certainty that this was not related to cephalexin." As noted in [facility]     Patient with severe drug reaction including exfoliating skin rash and hypotension after being prescribed both cephalexin and sulfamethoxazole-trimethoprim simultaneously. Favor SMX as much more likely culprit as patient had tolerated other cephalosporins in the past, but cannot say with complete certainty that this was not related to cephalexin.  "Patient with severe drug reaction including exfoliating skin rash and hypotension after being prescribed both cephalexin and sulfamethoxazole-trimethoprim simultaneously. Favor SMX as much more likely culprit as patient had tolerated other cephalosporins in  the past, but cannot say with complete certainty that this was not related to cephalexin." As  noted in [facility]  "Patient with severe drug reaction including exfoliating skin rash and hypotension after being prescribed both cephalexin and sulfamethoxazole-trimethoprim simultaneously. Favor SMX as much more likely culprit as patient had tolerated other cephalosporins in the past, but cannot say with complete certainty that this was not related to cephalexin." As noted in [facility]     Patient with severe drug reaction inc... (TRUNCATED)    Abran Duke, PharmD, BCPS Clinical Pharmacist Phone: [phone]

## 2022-11-17 NOTE — Progress Notes (Signed)
New Admission Note:   Arrival Method: Via Stretcher from ED Mental Orientation:  A & O x4 - Impulsive and non-compliant Telemetry: Box 5M14 Assessment: Completed Skin:  Abrasion to Right Knee/BLE has discolored toughened skin IV:  Left Forearm NSL Pain: Denies Tubes:  N/A Safety Measures: Patient refused to sign Fall Safety Plan Admission: Completed 5 MW Orientation: Patient has been orientated to the room, unit and staff.  Family:  None at bedside  Patient is currently homeless.  Upon arrival to the floor, he would not let us obtain VS.  He took his oxygen off and immediately went to the bathroom.  He refused Telemetry and VS until he had taken a shower.  He is also a High Fall Risk but refuses the bed alarm.  He finally allowed [place] to put him on Telemetry and take his VS.  He refused the MRSA PCR Swab ordered.  Dr. Margo Aye was made aware.    He has his cell phone, charger, walker with tennis balls, and glasses in the room.  He also, has a book bag and several other bags.  He states he has no medications with him.  He did not want to do the Admission History at this time.  He initially refused his AM labs.  He again took his telemetry off without asking and took another shower.  When finished, we put his telemetry back on.  He finally agreed to AM Labs and the Heparin SQ injection.  Orders have been reviewed and implemented. Will continue to monitor the patient. Call light has been placed within reach and bed alarm has been activated.   Bernie Covey RN Phone number: [phone]

## 2022-11-17 NOTE — Progress Notes (Signed)
CCMD called about telemtery box. Patient refused it being placed on him at this time. MD notified at the bed side and patient is being educated about the risk . Writer will attempt again to place the telemetry box on and notifiy teley if he refuses.

## 2022-11-17 NOTE — Consult Note (Signed)
Renal Service Consult Note [facility]  [name] 11/17/2022 [name], MD Requesting Physician: Dr. Rhona Leavens  Reason for Consult: ESRD pt w/ SOB HPI: The patient is a 52 y.o. year-old w/ PMH as below who presented to ED w/ c/o SOB. He missed dialysis yesterday 6/29. In ED pt was somnolent, temp 99.5, wbfc 11K. SpO2 85% on RA, improved on [place] O2. CXR showed R > L infiltrates c/w pna and/or pulm edema. Pt was started on IV abx for possible pna. We are asked to see for dialysis.    Pt seen in room. Pt is somnolent but understands questions and nods head in response. He says his last HD was Thursday 6/27.  I have not way to verify as the software is down on our outpatient HD units information.   ROS - denies CP, no joint pain, no HA, no blurry vision, no rash, no diarrhea, no nausea/ vomiting, no dysuria, no difficulty voiding   Past Medical History  Past Medical History:  Diagnosis Date   Anxiety 02/2019   Anxiety 10/2019   Diabetes mellitus type II, uncontrolled 07/17/2006   Elevated alkaline phosphatase level 01/2019   ESSENTIAL HYPERTENSION 07/17/2006   Homelessness 10/13/2021   HYPERLIPIDEMIA 11/24/2008   Insomnia 02/2019   Left bundle branch block 02/22/2019   Lesion of lip 10/2019   Pneumonia 10/22/2011   Suicidal ideations 02/2019   Vitamin D deficiency 01/2019   Past Surgical History  Past Surgical History:  Procedure Laterality Date   ARM SURGERY     AV FISTULA PLACEMENT Right 02/20/2021   Procedure: RIGHT ARTERIOVENOUS GRAFT CREATION;  Surgeon: Chuck Hint, MD;  Location: [facility] OR;  Service: Vascular;  Laterality: Right;   INSERTION OF DIALYSIS CATHETER Left 02/20/2021   Procedure: INSERTION OF DIALYSIS CATHETER USING PALINDROME CATHETER;  Surgeon: Chuck Hint, MD;  Location: [facility] OR;  Service: Vascular;  Laterality: Left;   IR AV DIALY SHUNT INTRO NEEDLE/INTRACATH INITIAL W/PTA/IMG RIGHT Right 11/06/2021   IR FLUORO GUIDE CV LINE RIGHT   09/12/2022   IR REMOVAL TUN CV CATH W/O FL  07/27/2021   IR THROMBECTOMY AV FISTULA W/THROMBOLYSIS INC/SHUNT/IMG RIGHT  09/11/2022   IR US GUIDE VASC ACCESS RIGHT  11/06/2021   IR US GUIDE VASC ACCESS RIGHT  09/11/2022   IR US GUIDE VASC ACCESS RIGHT  09/12/2022   TEE WITHOUT CARDIOVERSION N/A 10/25/2022   Procedure: TRANSESOPHAGEAL ECHOCARDIOGRAM;  Surgeon: Chilton Si, MD;  Location: [facility] INVASIVE CV LAB;  Service: Cardiovascular;  Laterality: N/A;   ULTRASOUND GUIDANCE FOR VASCULAR ACCESS  02/20/2021   Procedure: ULTRASOUND GUIDANCE FOR VASCULAR ACCESS;  Surgeon: Chuck Hint, MD;  Location: [facility] OR;  Service: Vascular;;   Family History  Family History  Problem Relation Age of Onset   Hypertension Mother 46   Cancer Mother        bladder cancer   Alcohol abuse Father    Hypertension Brother    Social History  reports that he has never smoked. He has never used smokeless tobacco. He reports that he does not drink alcohol and does not use drugs. Allergies  Allergies  Allergen Reactions   Sulfa Antibiotics Rash    Severe rash.  Severe rash.     Severe rash.   Other Other (See Comments)    Patient states he is allergic to the TB test - unknown reaction during childhood    Tuberculin Ppd     Other Reaction(s): Other (See Comments)  Bactrim [Sulfamethoxazole-Trimethoprim] Rash   Cephalexin Other (See Comments) and Rash    Patient with severe drug reaction including exfoliating skin rash and hypotension after being prescribed both cephalexin and sulfamethoxazole-trimethoprim simultaneously. Favor SMX as much more likely culprit as patient had tolerated other cephalosporins in the past, but cannot say with complete certainty that this was not related to cephalexin.  Other Reaction(s): Other, Other (See Comments)  Patient with severe drug reaction including exfoliating skin rash and hypotension after being prescribed both cephalexin and sulfamethoxazole-trimethoprim  simultaneously. Favor SMX as much more likely culprit as patient had tolerated other cephalosporins in the past, but cannot say with complete certainty that this was not related to cephalexin.   "Patient with severe drug reaction including exfoliating skin rash and hypotension after being prescribed both cephalexin and sulfamethoxazole-trimethoprim simultaneously. Favor SMX as much more likely culprit as patient had tolerated other cephalosporins in the past, but cannot say with complete certainty that this was not related to cephalexin." As noted in [facility]   "Patient with severe drug reaction including exfoliating skin rash and hypotension after being prescribed both cephalexin and sulfamethoxazole-trimethoprim simultaneously. Favor SMX as much more likely culprit as patient had tolerated other cephalosporins in the past, but cannot say with complete certainty that this was not related to cephalexin." As noted in [facility]   "Patient with severe drug reaction including exfoliating skin rash and hypotension after being prescribed both cephalexin and sulfamethoxazole-trimethoprim simultaneously. Favor SMX as much more likely culprit as patient had tolerated other cephalosporins in the past, but cannot say with complete certainty that this was not related to cephalexin." As noted in [facility]     Patient with severe drug reaction including exfoliating skin rash and hypotension after being prescribed both cephalexin and sulfamethoxazole-trimethoprim simultaneously. Favor SMX as much more likely culprit as patient had tolerated other cephalosporins in the past, but cannot say with complete certainty that this was not related to cephalexin.  "Patient with severe drug reaction including exfoliating skin rash and hypotension after being prescribed both cephalexin and sulfamethoxazole-trimethoprim simultaneously. Favor SMX as much more likely culprit as patient had tolerated other cephalosporins in the past,  but cannot say with complete certainty that this was not related to cephalexin." As noted in [facility]  "Patient with severe drug reaction including exfoliating skin rash and hypotension after being prescribed both cephalexin and sulfamethoxazole-trimethoprim simultaneously. Favor SMX as much more likely culprit as patient had tolerated other cephalosporins in the past, but cannot say with complete certainty that this was not related to cephalexin." As noted in [facility]     Patient with severe drug reaction inc... (TRUNCATED)   Home medications Prior to Admission medications   Medication Sig Start Date End Date Taking? Authorizing Provider  acetaminophen (TYLENOL) 500 MG tablet Take 1 tablet (500 mg total) by mouth every 8 (eight) hours as needed for mild pain. 05/28/22   Leroy Sea, MD  ASPIRIN LOW DOSE 81 MG tablet Take 1 tablet (81 mg total) by mouth daily. 05/28/22   Leroy Sea, MD  benzonatate (TESSALON) 100 MG capsule Take 1 capsule (100 mg total) by mouth every 8 (eight) hours. 09/09/22   Sabas Sous, MD  calcitRIOL (ROCALTROL) 0.25 MCG capsule Take 1 capsule (0.25 mcg total) by mouth every Tuesday, Thursday, and Saturday at 6 PM. 10/26/22   Ghimire, Werner Lean, MD  calcium acetate (PHOSLO) 667 MG capsule Take 3 capsules (2,001 mg total) by mouth 3 (three) times  daily. 05/28/22   Leroy Sea, MD  carvedilol (COREG) 25 MG tablet Take 1 tablet (25 mg total) by mouth 2 (two) times daily with a meal. 10/26/22   Ghimire, Werner Lean, MD  Fingerstix Lancets MISC Use as directed up to 4 times daily. 05/28/22   Leroy Sea, MD  glucose blood test strip Use to check fasting blood sugar once daily. 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 Insulin dependent 11/29/20   Hollice Espy, MD  glucose blood test strip Use as directed up to 4 times daily 05/28/22   Leroy Sea, MD  insulin aspart (NOVOLOG FLEXPEN) 100 UNIT/ML FlexPen 0-9 Units, Subcutaneous, 3 times daily with meals CBG < 70: Implement  Hypoglycemia measures CBG 70 - 120: 0 units CBG 121 - 150: 1 unit CBG 151 - 200: 2 units CBG 201 - 250: 3 units CBG 251 - 300: 5 units CBG 301 - 350: 7 units CBG 351 - 400: 9 units CBG > 400: call MD 10/26/22   Maretta Bees, MD  insulin glargine-yfgn (SEMGLEE) 100 UNIT/ML injection Inject 0.07 mLs (7 Units total) into the skin at bedtime. 10/26/22   Ghimire, Werner Lean, MD  Insulin Pen Needle 32G X 4 MM MISC Use with insulin pens 05/28/22   Leroy Sea, MD  Insulin Syringe-Needle U-100 25G X 1" 1 ML MISC For 4 times a day insulin SQ, 1 month supply. Diagnosis E11.65 05/28/22   Leroy Sea, MD  isosorbide mononitrate (IMDUR) 60 MG 24 hr tablet Take 1 tablet (60 mg total) by mouth daily. 10/26/22   Ghimire, Werner Lean, MD  melatonin 3 MG TABS tablet Take 1 tablet (3 mg total) by mouth at bedtime as needed (insomnia). 10/26/22   Ghimire, Werner Lean, MD  oxyCODONE-acetaminophen (PERCOCET/ROXICET) 5-325 MG tablet Take 1 tablet by mouth every 12 (twelve) hours as needed for severe pain. 10/26/22   Ghimire, Werner Lean, MD  PROTONIX 40 MG tablet Take 1 tablet (40 mg total) by mouth daily. 05/25/22   Leroy Sea, MD  vancomycin IVPB Inject 750 mg into the vein Every Tuesday,Thursday,and Saturday with dialysis. Indication:  MRSA bacteremia First Dose: Yes Last Day of Therapy:  11/27/22 Labs - Once weekly: ESR and CRP, CBC, BMP, vancomycin trough Method of administration: Per hemodialysis protocol at the HD center Method of administration may be changed at the discretion of the patient and/or caregiver's ability to self-administer the medication ordered. 10/26/22 11/27/22  Randall Hiss, MD  gabapentin (NEURONTIN) 300 MG capsule Take 1 capsule (300 mg total) by mouth 3 (three) times daily. Patient not taking: Reported on 07/27/2019 07/29/18 08/30/19  Kallie Locks, FNP  sildenafil (VIAGRA) 25 MG tablet Take 1 tablet (25 mg total) by mouth as needed for erectile dysfunction. for erectile  dysfunction. Patient not taking: Reported on 08/11/2020 10/13/19 11/27/20  Kallie Locks, FNP     Vitals:   11/17/22 0046 11/17/22 0449 11/17/22 0458 11/17/22 0954  BP: (!) 159/85 (!) 167/81  138/74  Pulse: 80 77  72  Resp: 16 18  16   Temp: 98.1 F (36.7 C) 98.1 F (36.7 C)  97.7 F (36.5 C)  TempSrc: Oral Oral  Oral  SpO2: 100% 100%  100%  Weight:   72.4 kg   Height:   5\' 7"  (1.702 m)    Exam Gen alert, no distress, somnolent, chronically ill appearing No rash, cyanosis or gangrene Sclera anicteric, throat clear  No jvd or bruits Chest clear bilat to  bases, no rales/ wheezing RRR no MRG Abd is protuberant, nontender, bs GU normal male MS no joint effusions or deformity Ext diffuse 2+ bilat LE edema, chronic skin changes Neuro is alert, Ox 3 , nf    RUE AVG +bruit       Home meds include - rocaltrol 0.25 mcg po TTS, phoslo 3 ac tid, insulin aspart/ glargine, imdur 60, percocet, protonix, IV vanc 750mg  after each HD thru 7/10, asa, coreg 3.125 bid, prns     OP HD: [facility] TTS  4h  400/800   2/2.5 bath  RFA AVG   Heparin 2000  Provider hub/ ecube are down today   Assessment/ Plan: PNA - R sided by CXR, admitted and started on IV vanc/ cefepime Acute hypoxic resp failure - due to pulm edema and/or pna. Plan HD tonight for volume overload / missed HD.  ESRD - on HD TTS. Missed HD 6/29 yesterday. Hd as above.  Volume - typically has chronic volume overload issues, usually runs 5-10 kg over dry wt. Has chronic LE edema.  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  MBD ckd - CCa and phos in range. Cont binders.  Recent admit/ MRSA bacteremia - had TEE here 6/07 which showed no endocarditis. Pt to complete 750mg  IV vanc after each HD TTS through 11/27/22.  HFrEF - last echo showed LVEF 20-25% Homelessness - ongoing problem for him       Vinson Moselle  MD [facility] 11/17/2022, 2:28 PM  Recent Labs  Lab 11/14/22 0056 11/16/22 2129 11/17/22 0510  HGB 12.9* 9.4*  --   ALBUMIN  --    --  3.1*  CALCIUM  --  8.2* 8.4*  PHOS  --   --  5.0*  CREATININE 6.90* 7.85* 8.22*  K 4.0 3.7 4.1   Inpatient medications:  aspirin EC  81 mg Oral Daily   carvedilol  3.125 mg Oral BID WC   heparin  5,000 Units Subcutaneous Q8H   insulin aspart  0-5 Units Subcutaneous QHS   insulin aspart  0-6 Units Subcutaneous TID WC    ceFEPime (MAXIPIME) IV     vancomycin     [START ON 11/19/2022] vancomycin     acetaminophen, guaiFENesin, ipratropium-albuterol, melatonin, mouth rinse, polyethylene glycol, prochlorperazine

## 2022-11-17 NOTE — Progress Notes (Signed)
Patient Refused telemetry when initially asked. When approached 20 minutes later. Patient stated that he would put it back on later. Charge nurse notified and CCMD aware.

## 2022-11-18 ENCOUNTER — Ambulatory Visit: Payer: Medicaid Other | Admitting: Infectious Diseases

## 2022-11-18 DIAGNOSIS — R0602 Shortness of breath: Secondary | ICD-10-CM | POA: Diagnosis not present

## 2022-11-18 DIAGNOSIS — J189 Pneumonia, unspecified organism: Secondary | ICD-10-CM | POA: Diagnosis not present

## 2022-11-18 DIAGNOSIS — N186 End stage renal disease: Secondary | ICD-10-CM | POA: Diagnosis not present

## 2022-11-18 LAB — RENAL FUNCTION PANEL
Albumin: 2.9 g/dL — ABNORMAL LOW (ref 3.5–5.0)
Anion gap: 16 — ABNORMAL HIGH (ref 5–15)
BUN: 62 mg/dL — ABNORMAL HIGH (ref 6–20)
CO2: 22 mmol/L (ref 22–32)
Calcium: 8 mg/dL — ABNORMAL LOW (ref 8.9–10.3)
Chloride: 96 mmol/L — ABNORMAL LOW (ref 98–111)
Creatinine, Ser: 9.76 mg/dL — ABNORMAL HIGH (ref 0.61–1.24)
GFR, Estimated: 6 mL/min — ABNORMAL LOW (ref >60)
Glucose, Bld: 119 mg/dL — ABNORMAL HIGH (ref 70–99)
Phosphorus: 6.3 mg/dL — ABNORMAL HIGH (ref 2.5–4.6)
Potassium: 3.7 mmol/L (ref 3.5–5.1)
Sodium: 134 mmol/L — ABNORMAL LOW (ref 135–145)

## 2022-11-18 LAB — GLUCOSE, CAPILLARY: Glucose-Capillary: 94 mg/dL (ref 70–99)

## 2022-11-18 LAB — CBC
HCT: 33.4 % — ABNORMAL LOW (ref 39.0–52.0)
Hemoglobin: 10.1 g/dL — ABNORMAL LOW (ref 13.0–17.0)
MCH: 27 pg (ref 26.0–34.0)
MCHC: 30.2 g/dL (ref 30.0–36.0)
MCV: 89.3 fL (ref 80.0–100.0)
Platelets: 363 10*3/uL (ref 150–400)
RBC: 3.74 MIL/uL — ABNORMAL LOW (ref 4.22–5.81)
RDW: 16.4 % — ABNORMAL HIGH (ref 11.5–15.5)
WBC: 5.7 10*3/uL (ref 4.0–10.5)
nRBC: 0 % (ref 0.0–0.2)

## 2022-11-18 LAB — CULTURE, BLOOD (ROUTINE X 2)
Culture: NO GROWTH
Special Requests: ADEQUATE

## 2022-11-18 LAB — SARS CORONAVIRUS 2 BY RT PCR: SARS Coronavirus 2 by RT PCR: NEGATIVE

## 2022-11-18 MED ORDER — HEPARIN SODIUM (PORCINE) 1000 UNIT/ML IJ SOLN: 2000.0000 [IU] | Freq: Once | INTRAMUSCULAR | Status: AC

## 2022-11-18 MED ORDER — BISMUTH SUBSALICYLATE 262 MG/15ML PO SUSP
30.0000 mL | Freq: Four times a day (QID) | ORAL | Status: DC | PRN
  Administered 2022-11-19: 30 mL via ORAL
  Filled 2022-11-18: qty 236

## 2022-11-18 MED ORDER — HEPARIN SODIUM (PORCINE) 1000 UNIT/ML DIALYSIS: 2000.0000 [IU] | Freq: Once | INTRAMUSCULAR | Status: DC

## 2022-11-18 MED ORDER — VANCOMYCIN HCL IN DEXTROSE 1-5 GM/200ML-% IV SOLN
1000.0000 mg | Freq: Once | INTRAVENOUS | Status: AC
  Administered 2022-11-18: 1000 mg via INTRAVENOUS
  Filled 2022-11-18: qty 200

## 2022-11-18 MED ORDER — HEPARIN SODIUM (PORCINE) 1000 UNIT/ML IJ SOLN
INTRAMUSCULAR | Status: AC
  Administered 2022-11-18: 2000 [IU] via INTRAVENOUS
  Filled 2022-11-18: qty 4

## 2022-11-18 NOTE — Progress Notes (Signed)
OT Cancellation Note  Patient Details Name: ROANAN BODLEY MRN: [id number] DOB: 08-19-1970   Cancelled Treatment:    Reason Eval/Treat Not Completed: Patient at procedure or test/ unavailable.  Continues in room dialysis.  Belford Pascucci D Scotlynn Noyes 11/18/2022, 1:31 PM 11/18/2022  RP, OTR/L  Acute Rehabilitation Services  Office:  [phone]

## 2022-11-18 NOTE — Progress Notes (Signed)
During the night, the patient continues to intermittently refuse his telemetry.  He keeps taking it off.  He refused his 2200 Heparin and 0600 Heparin SQ.  He again refused his Maxipime. He continues to remove his oxygen and has again refused his VS.  He is still refusing his COVID Swab and his MRSA PCR swab.  Multiple attempts to educate patient but he ignores staff or says "No".  Hemodialysis came to the room this morning to do hemodialysis.  He refused and told them to come back after breakfast.  The HD RN will notify Renal.  I have made Dr. Antionette Char aware of the situation.  I also notified the night pharmacist of the antibiotic refusal.  Will continue to monitor patient.

## 2022-11-18 NOTE — Procedures (Signed)
HD Note:  Some information was entered later than the data was gathered due to patient care needs. The stated time with the data is accurate.  Patient being treated at bedside Alert and oriented.  Informed consent signed and in chart.   TX duration:3.5 hours  Patient not communicative most of the interaction.Would not cooperate with the process of setting up  to start HD unitl it was stated that these things (vitals, lying in a position to allow access to his fistula) were required for the treatment to be done.  Patient had 2 liquid stools.  Patient had a coughing episode that produced a large amount of bloody mucous.  Initial set up of the machine alerted for air in the venous line.  Machine re set.   Alert, without acute distress.  Hand-off given to patient's nurse.   Access used: right upper arm fistula Access issues: none  Total UF removed: 4000 ml      [facility]

## 2022-11-18 NOTE — Progress Notes (Signed)
PT Cancellation Note  Patient Details Name: [name] MRN: [id number] DOB: 09-11-1970   Cancelled Treatment:    Reason Eval/Treat Not Completed: Patient at procedure or test/unavailable. Patient receiving HD in his room. Will re-attempt when patient available.     Yolunda Kloos 11/18/2022, 12:10 PM

## 2022-11-18 NOTE — Progress Notes (Signed)
Very limited assessment as patient refused to participate.  Refused RN assessment, CBG, vitals and tele monitoring.  Patient education completed and patient would nod to express understanding.

## 2022-11-18 NOTE — Progress Notes (Signed)
Progress Note   Patient: [name] [id number] DOB: [birth year] DOA: 11/16/2022     2 DOS: the patient was seen and examined on 11/18/2022    Brief hospital course: 52 y.o. 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  Admitted for MRSA bacteremia and discharged on 10/26/2022, who presented via EMS due to progressive shortness of breath throughout the course of the day.  EMS was activated.  Upon arrival the patient was hypoxic with O2 saturation of 85% on room air.  He was not wearing oxygen.  Was placed on 4 L nasal cannula by EMS with improvement of his O2 saturation up to 100%.  The patient missed hemodialysis today.  He was brought into the ED for further evaluation.   Assessment and Plan: Right-sided pneumonia, POA Personally reviewed chest x-ray done on admission which shows right middle and right lower lobe pulmonary infiltrates suggestive of pneumonia WBC down to 5.7 Continued on IV vancomycin and cefepime DuoNebs every 6 hours Antitussives as needed Procal is elevated Pt has refused covid testing despite risk factors   Acute hypoxic respiratory failure secondary to the above Unclear baseline oxygen requirement. Initially requiring 4LNC Now weaned to RA   ESRD on HD TTS Missed his hemodialysis session on Saturday 11/16/2022. Last hemodialysis session was on Thursday 11/14/2022. Nephrology consulted by EDP.   -Continue hemodyalysis per HD   QTc prolongation QTc on admission 12-lead EKG 528 Avoid QTc prolonging agents Optimize magnesium and potassium levels Electrolytes mainly managed with hemodialysis Monitor on telemetry.   Recent admission for MRSA bacteremia, TEE 10/25/2022 with no  evidence of vegetation or infective endocarditis. Patient is currently on IV vancomycin and followed by infectious disease outpatient IV vancomycin at hemodialysis Tuesday Thursday and Saturday, last day of therapy 11/27/2022. Continue IV vancomycin as recommended by infectious disease, to continue on HD   Chronic HFrEF 20 to 25% Last 2D echo done on 10/22/2022 revealed LVEF 20 to 25% with left ventricle global hypokinesis Continue volume management via HD   Hypertension BP is currently stable Slowly resume home oral antihypertensives as blood pressure allows. Continue to monitor vital signs and avoid hypotension, maintain MAP greater than 65.   Type 2 diabetes with hyperglycemia Last hemoglobin A1c was 7.7 on 09/08/2022 Start renal carb modified diet On SSI coverage   Anemia of chronic disease No reported overt bleeding Continue to monitor H&H.   Homelessness TOC consulted to assist with disposition       Subjective: Intentionally non-verbal and minimally interactive  Physical Exam: Vitals:   11/18/22 1515 11/18/22 1530 11/18/22 1542 11/18/22 1600  BP: (!) 174/91 (!) 163/88 (!) 176/91 (!) 164/91  Pulse: 73 72 73 71  Resp: 18 18 16 15   Temp:    98.4 F (36.9 C)  TempSrc:    Oral  SpO2: 100% 100% 100% 98%  Weight:      Height:       General exam: Conversant, in no acute distress Respiratory system: normal chest rise, clear, no audible wheezing Cardiovascular system: regular rhythm, s1-s2 Gastrointestinal system: Nondistended, nontender, pos BS Central nervous system: No seizures, no tremors Extremities: No cyanosis, no joint deformities Skin: No rashes, no pallor Psychiatry: Affect normal // no auditory hallucinations  Data Reviewed:  Labs reviewed: Na 134, K 3.7, Cr 9.76, Hgb 10.1   Family Communication: Pt in room, family not at bedside  Disposition: Status is: Inpatient Remains inpatient appropriate because: severity of illness  Planned Discharge  Destination: Home    Author: Rickey Barbara, MD 11/18/2022 6:39 PM  For on call review [email or web address].

## 2022-11-18 NOTE — Progress Notes (Addendum)
[facility] Progress Note   Subjective:   Pt seen in room. He ignored and refused to speak to me. Allowed exam. We had called for him for HD overnight - he refused. Told him that we would call again this afternoon.  Objective Vitals:   11/17/22 0954 11/17/22 1851 11/17/22 2100 11/18/22 0000  BP: 138/74 (!) 143/79    Pulse: 72 72    Resp: 16 16 16 10   Temp: 97.7 F (36.5 C)     TempSrc: Oral     SpO2: 100% 100%    Weight:      Height:       Physical Exam General: Chronically ill appearing man, NAD. Room air - nasal cannula laying on ground Heart: RRR; no murmur Lungs: CTAB - poor resp effort Extremities: 1+ BLE edema Dialysis Access: AVG  Additional Objective Labs: Basic Metabolic Panel: Recent Labs  Lab 11/14/22 0056 11/16/22 2129 11/17/22 0510  NA 138 136 137  K 4.0 3.7 4.1  CL 100 94* 95*  CO2  --  24 23  GLUCOSE 167* 132* 112*  BUN 39* 52* 56*  CREATININE 6.90* 7.85* 8.22*  CALCIUM  --  8.2* 8.4*  PHOS  --   --  5.0*   Liver Function Tests: Recent Labs  Lab 11/17/22 0510  ALBUMIN 3.1*   CBC: Recent Labs  Lab 11/14/22 0056 11/16/22 2129  WBC  --  11.9*  NEUTROABS  --  9.2*  HGB 12.9* 9.4*  HCT 38.0* 31.6*  MCV  --  88.8  PLT  --  311   Blood Culture    Component Value Date/Time   SDES BLOOD SITE NOT SPECIFIED 11/16/2022 2055   SPECREQUEST  11/16/2022 2055    BOTTLES DRAWN AEROBIC AND ANAEROBIC Blood Culture adequate volume   CULT  11/16/2022 2055    NO GROWTH < 12 HOURS Performed at [facility], [address] [place], [place] [address]    REPTSTATUS PENDING 11/16/2022 2055   Studies/Results: DG Chest Port 1 View  Result Date: 11/16/2022 CLINICAL DATA:  Shortness of breath EXAM: PORTABLE CHEST 1 VIEW COMPARISON:  11/12/2022 FINDINGS: Cardiac shadow remains enlarged. Central vascular congestion is again identified. Patchy airspace opacity is noted in the right upper lobe along the minor fissure as well as in the  medial aspect of the right lower lobe. Left lung remains clear. IMPRESSION: Changes consistent with multifocal infiltrate on the right. Changes of mild CHF. Electronically Signed   By: Alcide Clever M.D.   On: 11/16/2022 20:43    Medications:  ceFEPime (MAXIPIME) IV     vancomycin     [START ON 11/19/2022] vancomycin      aspirin EC  81 mg Oral Daily   carvedilol  3.125 mg Oral BID WC   Chlorhexidine Gluconate Cloth  6 each Topical Q0600   heparin  5,000 Units Subcutaneous Q8H   heparin sodium (porcine)       insulin aspart  0-5 Units Subcutaneous QHS   insulin aspart  0-6 Units Subcutaneous TID WC    Dialysis Orders: TTS at [facility] 4hr, 400/800, EDW 61.5kg, 2K/2.5Ca, AVG, heparin 2000U bolus ~last HD 6/27, left at 70.4kg (9kg up) - Mircera IV q 2 weeks (last given 6/27) - Calcitriol 0.52mcg PO q HD  Assessment/Plan: PNA - R sided by CXR, admitted and started on IV vanc/ cefepime Acute hypoxic resp failure - due to pulm edema and/or pna. Ordered HD last night -> patient refused. Plan  is to circle back on HD for him this afternoon. ESRD: Usual TTS schedule - often missing HD and not getting anywhere close to his dry weight. BP/volume: BP high with edema, chronic overload issues. Anemia of ESRD: Hgb 12 -> 9.4, not due for ESA yet. Secondary HPTH: Ca/Phos ok - continue home meds. Recent admit/ MRSA bacteremia: S/p TEE 6/7 without endocarditis. Supposed to be getting Vancomycin 750mg  IV q HD until 11/27/22 (has missed some doses with missed HD).  HFrEF: Last echo with EF 20-25% Homelessness - ongoing problem for him   Ozzie Hoyle, Cordelia Poche 11/18/2022, 11:33 AM  [facility]  Nephrology attending: I have personally seen the patient.  Chart reviewed.  I agree with assessment plan as above.  Apparently patient refused dialysis last night.  He agreed to do dialysis this morning but did not like to talk much.  He has a history of nonadherence with outpatient HD.  We will try  to UF and continue dialysis per schedule.  Discussed with HD nurses.  Eddie North, [facility].

## 2022-11-18 NOTE — Hospital Course (Signed)
52 y.o. 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  Admitted for MRSA bacteremia and discharged on 10/26/2022, who presented via EMS due to progressive shortness of breath throughout the course of the day.  EMS was activated.  Upon arrival the patient was hypoxic with O2 saturation of 85% on room air.  He was not wearing oxygen.  Was placed on 4 L nasal cannula by EMS with improvement of his O2 saturation up to 100%.  The patient missed hemodialysis today.  He was brought into the ED for further evaluation.

## 2022-11-18 NOTE — Progress Notes (Addendum)
Progress Note   Patient: [name] [id number]:[id number] DOB: 1970-07-12 DOA: 11/16/2022     2 DOS: the patient was seen and examined on 6/302024  Late Entry. Pt was seen and examined 6/30   Brief hospital course: 52 y.o. 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  Admitted for MRSA bacteremia and discharged on 10/26/2022, who presented via EMS due to progressive shortness of breath throughout the course of the day.  EMS was activated.  Upon arrival the patient was hypoxic with O2 saturation of 85% on room air.  He was not wearing oxygen.  Was placed on 4 L nasal cannula by EMS with improvement of his O2 saturation up to 100%.  The patient missed hemodialysis today.  He was brought into the ED for further evaluation.   Assessment and Plan: Right-sided pneumonia, POA Personally reviewed chest x-ray done on admission which shows right middle and right lower lobe pulmonary infiltrates suggestive of pneumonia Mildly elevated WBC 11.9K, low-grade fever 99.5 Continued on IV vancomycin and cefepime DuoNebs every 6 hours Antitussives as needed Procal is elevated Ordered COVID test given risk factors and interstate travel recently. Pt has refused   Acute hypoxic respiratory failure secondary to the above Unclear baseline oxygen requirement. Was found to be hypoxic upon EMS arrival with O2 saturation of 85% Currently on 4 L nasal cannula with O2 saturation of 99 to 100% Continue to maintain O2 saturation above 92%   ESRD on HD TTS Missed his hemodialysis session on Saturday 11/16/2022. Last hemodialysis session was on Thursday 11/14/2022. Nephrology consulted by EDP.   -Continue hemodyalysis per HD   QTc  prolongation QTc on admission 12-lead EKG 528 Avoid QTc prolonging agents Optimize magnesium and potassium levels Electrolytes mainly managed with hemodialysis Monitor on telemetry.   Recent admission for MRSA bacteremia, TEE 10/25/2022 with no evidence of vegetation or infective endocarditis. Patient is currently on IV vancomycin and followed by infectious disease outpatient IV vancomycin at hemodialysis Tuesday Thursday and Saturday, last day of therapy 11/27/2022. ID recommended once weekly ESR, CRP, CBC, BMP, vancomycin trough Continue IV vancomycin as recommended by infectious disease, to continue on HD   Chronic HFrEF 20 to 25% Last 2D echo done on 10/22/2022 revealed LVEF 20 to 25% with left ventricle global hypokinesis Last BNP was greater than 4500 on 09/08/2022. Mild pulmonary edema after missing hemodialysis today, lower extremity edema. Resume home cardiac medications as blood pressure tolerates Start strict I's and O's and daily weight Volume status managed with hemodialysis   Hypertension BP is currently stable Slowly resume home oral antihypertensives as blood pressure allows. Continue to monitor vital signs and avoid hypotension, maintain MAP greater than 65.   Type 2 diabetes with hyperglycemia Last hemoglobin A1c was 7.7 on 09/08/2022 Start renal carb modified diet Start insulin sliding scale Avoid overcorrection of hyperglycemia in the setting of ESRD. Avoid hypoglycemia.   Anemia of chronic disease No reported overt bleeding Continue to monitor H&H.   Homelessness TOC consulted to assist with disposition       Subjective: Not wanting to talk, not very conversant  Physical Exam: Vitals:   11/18/22 1515 11/18/22 1530 11/18/22 1542 11/18/22 1600  BP: (!) 174/91 (!) 163/88 (!) 176/91 (!) 164/91  Pulse: 73 72 73 71  Resp: 18 18 16 15   Temp:    98.4 F (36.9 C)  TempSrc:    Oral  SpO2: 100% 100% 100% 98%  Weight:      Height:       General exam: Awake,  laying in bed, in nad Respiratory system: Normal respiratory effort, no wheezing Cardiovascular system: regular rate, s1, s2 Gastrointestinal system: Soft, nondistended, positive BS Central nervous system: CN2-12 grossly intact, strength intact Extremities: Perfused, no clubbing Skin: Normal skin turgor, no notable skin lesions seen Psychiatry: Mood normal // no visual hallucinations   Data Reviewed:  Labs reviewed: Na 134, K 3.7, Cr 9.76, Hgb 10.1   Family Communication: Pt in room, family not at bedside  Disposition: Status is: Inpatient Remains inpatient appropriate because: severity of illness  Planned Discharge Destination: Home     Author: Rickey Barbara, MD 11/18/2022 5:49 PM  For on call review [email or web address].

## 2022-11-19 DIAGNOSIS — R0602 Shortness of breath: Secondary | ICD-10-CM | POA: Diagnosis not present

## 2022-11-19 DIAGNOSIS — N186 End stage renal disease: Secondary | ICD-10-CM

## 2022-11-19 DIAGNOSIS — J189 Pneumonia, unspecified organism: Principal | ICD-10-CM

## 2022-11-19 LAB — CULTURE, BLOOD (ROUTINE X 2)

## 2022-11-19 LAB — HEPATITIS B SURFACE ANTIBODY, QUANTITATIVE: Hep B S AB Quant (Post): <3.5 m[IU]/mL — ABNORMAL LOW

## 2022-11-19 MED ORDER — HEPARIN SODIUM (PORCINE) 1000 UNIT/ML IJ SOLN
INTRAMUSCULAR | Status: AC
  Filled 2022-11-19: qty 2

## 2022-11-19 MED ORDER — CALCIUM ACETATE (PHOS BINDER) 667 MG PO CAPS
1334.0000 mg | ORAL_CAPSULE | Freq: Three times a day (TID) | ORAL | Status: DC
  Administered 2022-11-20 – 2022-11-22 (×6): 1334 mg via ORAL
  Filled 2022-11-19 (×6): qty 2

## 2022-11-19 MED ORDER — CALCITRIOL 0.25 MCG PO CAPS
0.2500 ug | ORAL_CAPSULE | ORAL | Status: DC
  Administered 2022-11-21: 0.25 ug via ORAL
  Filled 2022-11-19: qty 1

## 2022-11-19 MED ORDER — HEPARIN SODIUM (PORCINE) 1000 UNIT/ML DIALYSIS
2000.0000 [IU] | Freq: Once | INTRAMUSCULAR | Status: DC
  Filled 2022-11-19: qty 2

## 2022-11-19 NOTE — Progress Notes (Signed)
Pt agreed he would come to HD for tx today. Machine was set up w/ that understanding. When transport arrived to pick him up, pt refused to come to HD. He said he was told he could come to HD at 4 or 5. Confirmed w/ PA he was not told that as we can't give appointment times. Primary nurse states she told him the same thing and that is not how this works and that he needs to come when we have availability which is right now or right after change over, around after lunch time. Pt refused again. PA was made aware of pt's refusal to come to tx.

## 2022-11-19 NOTE — TOC Progression Note (Signed)
Transition of Care [facility]) - Initial/Assessment Note    Patient Details  Name: [name] MRN: [id number] Date of Birth: 05/20/1971  [facility] CM/SW Contact:    Ralene Bathe, LCSW Phone Number: 11/19/2022, 10:22 AM  Clinical Narrative:                 LCSW received consult for homelessness and went to meet with the patient at bedside to give resources.  LCSW called the patient's name numerous times and stated the reason for visit.  The patient ignored LCSW.  LCSW observed the patient to be lying in bed, with his eyes closed, and noticeably blinking.   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   MRSA bacteremia 10/20/2022   High anion gap metabolic acidosis 10/20/2022   Fall 10/20/2022   GERD (gastroesophageal reflux disease) 10/20/2022   Hemodialysis AV fistula thrombosis, initial encounter (HCC) 09/11/2022   AV fistula occlusion (HCC) 09/11/2022   CAP (community acquired pneumonia) 05/20/2022   Chronic combined systolic and diastolic congestive heart failure (HCC) 05/20/2022   Anxiety disorder, unspecified 03/26/2022   Hypocalcemia 03/25/2022   Iron deficiency anemia, unspecified 03/20/2022   CHF (congestive heart failure) (HCC) 11/15/2021   Nausea vomiting and diarrhea 11/07/2021   Homelessness 10/13/2021   Acute  hypoxic respiratory failure (HCC) 10/13/2021   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) 09/28/2021   Aspiration pneumonia (HCC) 09/28/2021   Volume overload 04/12/2021   Mild protein-calorie malnutrition (HCC) 03/06/2021   Aftercare including intermittent dialysis (HCC) 02/27/2021   Allergy, unspecified, initial encounter 02/27/2021   History of anemia due to chronic kidney disease 02/27/2021   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   Secondary hyperparathyroidism of renal origin (HCC) 02/27/2021   Shortness of breath 02/27/2021   Type 2 diabetes mellitus with other diabetic kidney complication (HCC) 02/27/2021   Hypertensive urgency 02/18/2021   Type 2 diabetes mellitus (HCC) 02/17/2021   Acute on chronic combined systolic and diastolic CHF (congestive heart failure) (HCC) 11/29/2020   Hypertensive crisis 11/27/2020   Stage 4 chronic kidney disease (HCC) 02/22/2020   Left ventricular dysfunction 04/27/2019   Hemoglobin A1C greater than 9%, indicating poor diabetic control 07/29/2018   LBBB (left bundle branch block)    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 [place] [address] Phone: [phone] Fax: [phone]  [facility] [place], [place] - [address] [facility] [address] [place] [address] Phone: [phone] Fax: [phone]     Social Determinants of Health (SDOH) Social History: SDOH Screenings   Food Insecurity: Food Insecurity Present (05/08/2022)  Housing: High Risk (10/21/2022)  Transportation Needs:  Unmet Transportation Needs (10/21/2022)  Utilities: Not At Risk (10/21/2022)  Depression (PHQ2-9): Low Risk  (10/13/2019)  Financial Resource Strain: High Risk (05/08/2022)  Tobacco Use: Low Risk  (11/16/2022)   SDOH Interventions:     Readmission Risk Interventions    10/22/2022    2:17 PM 11/07/2021    4:21 PM 02/19/2021    3:52 PM  Readmission Risk Prevention Plan  Transportation Screening Complete Complete Complete  Medication Review Oceanographer) Complete Complete Complete  PCP or Specialist appointment within 3-5 days of discharge Complete Complete Complete  HRI or Home Care Consult Complete Complete Complete  SW Recovery Care/Counseling Consult Complete Complete Complete  Palliative Care Screening Not Applicable Not Applicable Not Applicable  Skilled Nursing Facility Complete Not Applicable Not Applicable

## 2022-11-19 NOTE — Progress Notes (Signed)
[facility] Progress Note   Subjective:  Seen in room - he is sitting on edge of bed, largely ignoring me, did agree for HD today for additional UF and to get back on TTS schedule.  Objective Vitals:   11/18/22 1530 11/18/22 1542 11/18/22 1600 11/18/22 2351  BP: (!) 163/88 (!) 176/91 (!) 164/91 139/70  Pulse: 72 73 71 76  Resp: 18 16 15 18   Temp:   98.4 F (36.9 C) 98.8 F (37.1 C)  TempSrc:   Oral   SpO2: 100% 100% 98%   Weight:      Height:       Physical Exam General: Chronically ill appearing man, NAD. Room air. Heart: RRR; no murmur Lungs: CTAB - poor resp effort Extremities: 1+ BLE edema Dialysis Access: AVG  Additional Objective Labs: Basic Metabolic Panel: Recent Labs  Lab 11/16/22 2129 11/17/22 0510 11/18/22 1240  NA 136 137 134*  K 3.7 4.1 3.7  CL 94* 95* 96*  CO2 24 23 22   GLUCOSE 132* 112* 119*  BUN 52* 56* 62*  CREATININE 7.85* 8.22* 9.76*  CALCIUM 8.2* 8.4* 8.0*  PHOS  --  5.0* 6.3*   Liver Function Tests: Recent Labs  Lab 11/17/22 0510 11/18/22 1240  ALBUMIN 3.1* 2.9*   CBC: Recent Labs  Lab 11/14/22 0056 11/16/22 2129 11/18/22 1243  WBC  --  11.9* 5.7  NEUTROABS  --  9.2*  --   HGB 12.9* 9.4* 10.1*  HCT 38.0* 31.6* 33.4*  MCV  --  88.8 89.3  PLT  --  311 363   Blood Culture    Component Value Date/Time   SDES BLOOD SITE NOT SPECIFIED 11/16/2022 2055   SPECREQUEST  11/16/2022 2055    BOTTLES DRAWN AEROBIC AND ANAEROBIC Blood Culture adequate volume   CULT  11/16/2022 2055    NO GROWTH 2 DAYS Performed at [facility], [address] [place], [place] [address]    REPTSTATUS PENDING 11/16/2022 2055   Medications:  ceFEPime (MAXIPIME) IV 1 g (11/18/22 2347)   vancomycin      aspirin EC  81 mg Oral Daily   carvedilol  3.125 mg Oral BID WC   Chlorhexidine Gluconate Cloth  6 each Topical Q0600   heparin  2,000 Units Dialysis Once in dialysis   heparin  5,000 Units Subcutaneous Q8H   insulin aspart   0-5 Units Subcutaneous QHS   insulin aspart  0-6 Units Subcutaneous TID WC    Dialysis Orders: TTS at [facility] 4hr, 400/800, EDW 61.5kg, 2K/2.5Ca, AVG, heparin 2000U bolus ~last HD 6/27, left at 70.4kg (9kg up) - Mircera IV q 2 weeks (last given 6/27) - Calcitriol 0.73mcg PO q HD   Assessment/Plan: PNA - R sided by CXR, admitted and started on IV vanc/ cefepime Acute hypoxic resp failure - due to pulm edema and/or pna. Refused HD 6/30, done 7/1 with 4L off - remains well above dry weight.  ESRD: Usual TTS schedule - often missing HD and not getting anywhere close to his dry weight. HD today to get back on his usual OP schedule - likely ok for discharge after? BP/volume: BP high with edema, chronic overload issues. Anemia of ESRD: Hgb 12 -> 9.4 -> 10.1, not due for ESA yet. Secondary HPTH: Ca ok, Phos high - restart binders (Phoslo is on OP med list) + VDRA. Recent admit/ MRSA bacteremia: S/p TEE 6/7 without endocarditis. Supposed to be getting Vancomycin 750mg  IV q HD until 11/27/22 (has  missed some doses with missed HD).  HFrEF: Last echo with EF 20-25% Homelessness - ongoing problem for him   Ozzie Hoyle, Cordelia Poche 11/19/2022, 9:22 AM  [employer]

## 2022-11-19 NOTE — Progress Notes (Signed)
Patient has refused all medications, vitals,blood glucose checks, and dialysis throughout the shift. RN educated patient on the importance of all these actions and patient actively ignores Charity fundraiser. Patient stating "I don't want to do any of that today." Patient originally agreed to go to HD this am. Upon transport patient states "No,I am not going."

## 2022-11-19 NOTE — Progress Notes (Signed)
OT Cancellation Note  Patient Details Name: [name] MRN: [id number] DOB: 1971-04-03   Cancelled Treatment:    Reason Eval/Treat Not Completed: Patient declined, no reason specified (Pt initially ignoring this therapist despite maximal encouragement and education. Pt eventually verbalized, "I don't need therapy, don't come back." OT to sign off. Please re-order if he becomes agreeable to participate.)  Donia Pounds 11/19/2022, 10:38 AM

## 2022-11-19 NOTE — Progress Notes (Signed)
Pt refused all medications/treatments tonight despite education regarding the reasoning for treatment. Pt states "I just want to sleep, I dont want any medications at all" I reiterated this would only take about two minutes of his time and pt still refused. Pt lying on lt side with eyes closed with covers pulled over his head. Pt requested for nurse to leave room, close door and leave lights out.

## 2022-11-19 NOTE — Progress Notes (Signed)
PT Cancellation Note  Patient Details Name: [name] MRN: [id number] DOB: May 14, 1971   Cancelled Treatment:    Reason Eval/Treat Not Completed: Patient declined, no reason specified. Patient states he is getting around fine, per nursing he is ambulatory in his room, getting into shower etc. Patient refusing PT at this time. Will sign off.    Eder Macek 11/19/2022, 11:12 AM

## 2022-11-19 NOTE — Progress Notes (Signed)
Progress Note   Patient: [name]:[id number] DOB: October 19, 1970 DOA: 11/16/2022     3 DOS: the patient was seen and examined on 11/18/2022    Brief hospital course: 52 y.o. 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  Admitted for MRSA bacteremia and discharged on 10/26/2022, who presented via EMS due to progressive shortness of breath throughout the course of the day.  EMS was activated.  Upon arrival the patient was hypoxic with O2 saturation of 85% on room air.  He was not wearing oxygen.  Was placed on 4 L nasal cannula by EMS with improvement of his O2 saturation up to 100%.  The patient missed hemodialysis today.  He was brought into the ED for further evaluation.   Assessment and Plan: Right-sided pneumonia, POA Personally reviewed chest x-ray done on admission which shows right middle and right lower lobe pulmonary infiltrates suggestive of pneumonia WBC down to 5.7 Continued on IV vancomycin and cefepime DuoNebs every 6 hours Antitussives as needed Procal is elevated COVID test neg   Acute hypoxic respiratory failure secondary to the above Unclear baseline oxygen requirement. Initially requiring 4LNC Now weaned to RA   ESRD on HD TTS Missed his hemodialysis session on Saturday 11/16/2022. Last hemodialysis session was on Thursday 11/14/2022. Nephrology consulted by EDP.   -Continue hemodyalysis per HD   QTc prolongation QTc on admission 12-lead EKG 528 Avoid QTc prolonging agents Optimize magnesium and potassium levels Electrolytes mainly managed with hemodialysis Monitor on telemetry.   Recent admission for MRSA bacteremia, TEE 10/25/2022 with no evidence of vegetation or infective  endocarditis. Patient is currently on IV vancomycin and followed by infectious disease outpatient IV vancomycin at hemodialysis Tuesday Thursday and Saturday, last day of therapy 11/27/2022. Continue IV vancomycin as recommended by infectious disease, to continue on HD   Chronic HFrEF 20 to 25% Last 2D echo done on 10/22/2022 revealed LVEF 20 to 25% with left ventricle global hypokinesis Continue volume management via HD   Hypertension BP is currently stable Slowly resume home oral antihypertensives as blood pressure allows. Continue to monitor vital signs and avoid hypotension, maintain MAP greater than 65.   Type 2 diabetes with hyperglycemia Last hemoglobin A1c was 7.7 on 09/08/2022 Start renal carb modified diet Continue SSI coverage   Anemia of chronic disease No reported overt bleeding Continue to monitor H&H.   Homelessness TOC consulted to assist with disposition       Subjective: choosing to not interact with staff or be cooperative  Physical Exam: Vitals:   11/18/22 1530 11/18/22 1542 11/18/22 1600 11/18/22 2351  BP: (!) 163/88 (!) 176/91 (!) 164/91 139/70  Pulse: 72 73 71 76  Resp: 18 16 15 18   Temp:   98.4 F (36.9 C) 98.8 F (37.1 C)  TempSrc:   Oral   SpO2: 100% 100% 98%   Weight:      Height:       Douglas exam: Awake, laying in bed, in nad Respiratory system: Normal respiratory effort, no wheezing Cardiovascular system: regular rate, s1, s2 Gastrointestinal system: Soft, nondistended, positive BS Central nervous system: CN2-12 grossly intact, strength intact Extremities: Perfused, no clubbing Skin: Normal skin turgor, no notable skin lesions seen Psychiatry: Mood normal //  no visual hallucinations   Data Reviewed:  There are no new results to review at this time.  Family Communication: Pt in room, family not at bedside  Disposition: Status is: Inpatient Remains inpatient appropriate because: severity of illness  Planned Discharge Destination:  Home    Author: Rickey Barbara, MD 11/19/2022 7:51 PM  For on call review [email or web address].

## 2022-11-19 NOTE — Progress Notes (Signed)
Contacted [facility] and spoke to Tax adviser. Confirmed pt has a chair at clinic and can resume at d/c. Pt's schedule is TTS. Will assist as needed.   Olivia Canter Renal Navigator [phone]

## 2022-11-20 ENCOUNTER — Ambulatory Visit: Payer: 59 | Admitting: Nurse Practitioner

## 2022-11-20 ENCOUNTER — Ambulatory Visit: Payer: Self-pay | Admitting: Infectious Diseases

## 2022-11-20 DIAGNOSIS — E1122 Type 2 diabetes mellitus with diabetic chronic kidney disease: Secondary | ICD-10-CM

## 2022-11-20 DIAGNOSIS — Z992 Dependence on renal dialysis: Secondary | ICD-10-CM

## 2022-11-20 DIAGNOSIS — I5042 Chronic combined systolic (congestive) and diastolic (congestive) heart failure: Secondary | ICD-10-CM | POA: Diagnosis not present

## 2022-11-20 DIAGNOSIS — B9562 Methicillin resistant Staphylococcus aureus infection as the cause of diseases classified elsewhere: Secondary | ICD-10-CM

## 2022-11-20 DIAGNOSIS — K219 Gastro-esophageal reflux disease without esophagitis: Secondary | ICD-10-CM

## 2022-11-20 DIAGNOSIS — J189 Pneumonia, unspecified organism: Secondary | ICD-10-CM | POA: Diagnosis not present

## 2022-11-20 DIAGNOSIS — R7881 Bacteremia: Secondary | ICD-10-CM

## 2022-11-20 DIAGNOSIS — N186 End stage renal disease: Secondary | ICD-10-CM | POA: Diagnosis not present

## 2022-11-20 DIAGNOSIS — J9601 Acute respiratory failure with hypoxia: Secondary | ICD-10-CM

## 2022-11-20 LAB — RENAL FUNCTION PANEL
Albumin: 2.7 g/dL — ABNORMAL LOW (ref 3.5–5.0)
Anion gap: 14 (ref 5–15)
BUN: 44 mg/dL — ABNORMAL HIGH (ref 6–20)
CO2: 25 mmol/L (ref 22–32)
Calcium: 7.9 mg/dL — ABNORMAL LOW (ref 8.9–10.3)
Chloride: 95 mmol/L — ABNORMAL LOW (ref 98–111)
Creatinine, Ser: 8.66 mg/dL — ABNORMAL HIGH (ref 0.61–1.24)
GFR, Estimated: 7 mL/min — ABNORMAL LOW (ref >60)
Glucose, Bld: 114 mg/dL — ABNORMAL HIGH (ref 70–99)
Phosphorus: 5 mg/dL — ABNORMAL HIGH (ref 2.5–4.6)
Potassium: 3.8 mmol/L (ref 3.5–5.1)
Sodium: 134 mmol/L — ABNORMAL LOW (ref 135–145)

## 2022-11-20 LAB — CBC
HCT: 34.1 % — ABNORMAL LOW (ref 39.0–52.0)
Hemoglobin: 10.1 g/dL — ABNORMAL LOW (ref 13.0–17.0)
MCH: 26.2 pg (ref 26.0–34.0)
MCHC: 29.6 g/dL — ABNORMAL LOW (ref 30.0–36.0)
MCV: 88.6 fL (ref 80.0–100.0)
Platelets: 370 10*3/uL (ref 150–400)
RBC: 3.85 MIL/uL — ABNORMAL LOW (ref 4.22–5.81)
RDW: 16.6 % — ABNORMAL HIGH (ref 11.5–15.5)
WBC: 5.3 10*3/uL (ref 4.0–10.5)
nRBC: 0 % (ref 0.0–0.2)

## 2022-11-20 LAB — CULTURE, BLOOD (ROUTINE X 2)
Culture: NO GROWTH
Special Requests: ADEQUATE

## 2022-11-20 LAB — GLUCOSE, CAPILLARY: Glucose-Capillary: 108 mg/dL — ABNORMAL HIGH (ref 70–99)

## 2022-11-20 MED ORDER — BISACODYL 10 MG RE SUPP: 10.0000 mg | Freq: Once | RECTAL | Status: DC

## 2022-11-20 MED ORDER — VANCOMYCIN HCL 750 MG/150ML IV SOLN
750.0000 mg | Freq: Once | INTRAVENOUS | Status: AC
  Administered 2022-11-20: 750 mg via INTRAVENOUS
  Filled 2022-11-20: qty 150

## 2022-11-20 MED ORDER — PANTOPRAZOLE SODIUM 40 MG PO TBEC
40.0000 mg | DELAYED_RELEASE_TABLET | Freq: Every day | ORAL | Status: DC
  Administered 2022-11-21 – 2022-11-22 (×2): 40 mg via ORAL
  Filled 2022-11-20 (×3): qty 1

## 2022-11-20 MED ORDER — SIMETHICONE 80 MG PO CHEW
80.0000 mg | CHEWABLE_TABLET | Freq: Four times a day (QID) | ORAL | Status: DC
  Administered 2022-11-20 – 2022-11-22 (×5): 80 mg via ORAL
  Filled 2022-11-20 (×5): qty 1

## 2022-11-20 NOTE — Progress Notes (Signed)
[facility] Progress Note   Subjective:  Seen in room. Had refused HD yesterday, today says he is willing for HD.  Objective Vitals:   11/18/22 1600 11/18/22 2351 11/20/22 0610 11/20/22 0759  BP: (!) 164/91 139/70 (!) 168/86 (!) 164/86  Pulse: 71 76 76 74  Resp: 15 18 18 18   Temp: 98.4 F (36.9 C) 98.8 F (37.1 C) 97.9 F (36.6 C) (!) 97 F (36.1 C)  TempSrc: Oral  Oral Oral  SpO2: 98%  97% 94%  Weight:      Height:       Physical Exam General: Chronically ill appearing man, NAD. Room air. Heart: RRR; no murmur Lungs: CTAB - poor resp effort Extremities: 1+ BLE edema Dialysis Access: AVG  Additional Objective Labs: Basic Metabolic Panel: Recent Labs  Lab 11/16/22 2129 11/17/22 0510 11/18/22 1240  NA 136 137 134*  K 3.7 4.1 3.7  CL 94* 95* 96*  CO2 24 23 22   GLUCOSE 132* 112* 119*  BUN 52* 56* 62*  CREATININE 7.85* 8.22* 9.76*  CALCIUM 8.2* 8.4* 8.0*  PHOS  --  5.0* 6.3*   Liver Function Tests: Recent Labs  Lab 11/17/22 0510 11/18/22 1240  ALBUMIN 3.1* 2.9*   CBC: Recent Labs  Lab 11/14/22 0056 11/16/22 2129 11/18/22 1243  WBC  --  11.9* 5.7  NEUTROABS  --  9.2*  --   HGB 12.9* 9.4* 10.1*  HCT 38.0* 31.6* 33.4*  MCV  --  88.8 89.3  PLT  --  311 363   Medications:  ceFEPime (MAXIPIME) IV 1 g (11/18/22 2347)   vancomycin      aspirin EC  81 mg Oral Daily   calcitRIOL  0.25 mcg Oral Once per day on Tue Thu Sat   calcium acetate  1,334 mg Oral TID WC   carvedilol  3.125 mg Oral BID WC   Chlorhexidine Gluconate Cloth  6 each Topical Q0600   heparin  2,000 Units Dialysis Once in dialysis   heparin  2,000 Units Dialysis Once in dialysis   heparin  5,000 Units Subcutaneous Q8H   insulin aspart  0-5 Units Subcutaneous QHS   insulin aspart  0-6 Units Subcutaneous TID WC   pantoprazole  40 mg Oral Daily    Dialysis Orders: TTS at [facility] 4hr, 400/800, EDW 61.5kg, 2K/2.5Ca, AVG, heparin 2000U bolus ~last HD 6/27, left at 70.4kg  (9kg up) - Mircera IV q 2 weeks (last given 6/27) - Calcitriol 0.60mcg PO q HD   Assessment/Plan: PNA - R sided by CXR, admitted and started on IV vanc/ cefepime Acute hypoxic resp failure - due to pulm edema and/or pna. Refused HD 6/30, done 7/1 with 4L off, refused 7/2 - remains well above dry weight.  ESRD: Usual TTS schedule - often missing HD and not getting anywhere close to his dry weight. Had refused HD yesterday - now agreeable to run today. Will order HD. BP/volume: BP high with edema, chronic overload issues. Anemia of ESRD: Hgb 12 -> 9.4 -> 10.1, not due for ESA yet. Secondary HPTH: Ca ok, Phos high - restarted binders (Phoslo is on OP med list) + VDRA. Recent admit/ MRSA bacteremia: S/p TEE 6/7 without endocarditis. Supposed to be getting Vancomycin 750mg  IV q HD until 11/27/22 (has missed some doses with missed HD).  HFrEF: Last echo with EF 20-25% Homelessness - ongoing problem for him  Dispo: Ok from renal standpoint after HD today  Ozzie Hoyle, PA-C 11/20/2022, 11:59 AM  [facility]

## 2022-11-20 NOTE — Progress Notes (Signed)
Pt. Refused blood glucose monitoring  [name]

## 2022-11-20 NOTE — Progress Notes (Signed)
Pt refused lab draw this morning despite education provided and reasoning for draw. Attempted twice after initial draw and pt did not respond to myself or phlebotomy tech.

## 2022-11-20 NOTE — Progress Notes (Signed)
Patient refused all meds,VS check,CBG check tonight. States "I'm good".

## 2022-11-20 NOTE — Plan of Care (Signed)
  Problem: Education: Goal: Individualized Educational Video(s) Outcome: Progressing   Problem: Coping: Goal: Ability to adjust to condition or change in health will improve Outcome: Progressing   Problem: Fluid Volume: Goal: Ability to maintain a balanced intake and output will improve Outcome: Progressing   Problem: Health Behavior/Discharge Planning: Goal: Ability to identify and utilize available resources and services will improve Outcome: Progressing Goal: Ability to manage health-related needs will improve Outcome: Progressing   Problem: Metabolic: Goal: Ability to maintain appropriate glucose levels will improve Outcome: Progressing   Problem: Nutritional: Goal: Maintenance of adequate nutrition will improve Outcome: Progressing Goal: Progress toward achieving an optimal weight will improve Outcome: Progressing   Problem: Skin Integrity: Goal: Risk for impaired skin integrity will decrease Outcome: Progressing   Problem: Tissue Perfusion: Goal: Adequacy of tissue perfusion will improve Outcome: Progressing   Problem: Education: Goal: Knowledge of General Education information will improve Description: Including pain rating scale, medication(s)/side effects and non-pharmacologic comfort measures Outcome: Progressing   Problem: Clinical Measurements: Goal: Ability to maintain clinical measurements within normal limits will improve Outcome: Progressing Goal: Will remain free from infection Outcome: Progressing Goal: Diagnostic test results will improve Outcome: Progressing Goal: Respiratory complications will improve Outcome: Progressing Goal: Cardiovascular complication will be avoided Outcome: Progressing   Problem: Activity: Goal: Risk for activity intolerance will decrease Outcome: Progressing   Problem: Nutrition: Goal: Adequate nutrition will be maintained Outcome: Progressing   Problem: Coping: Goal: Level of anxiety will decrease Outcome:  Progressing   Problem: Elimination: Goal: Will not experience complications related to bowel motility Outcome: Progressing Goal: Will not experience complications related to urinary retention Outcome: Progressing   Problem: Pain Managment: Goal: General experience of comfort will improve Outcome: Progressing   Problem: Safety: Goal: Ability to remain free from injury will improve Outcome: Progressing   Problem: Skin Integrity: Goal: Risk for impaired skin integrity will decrease Outcome: Progressing   Problem: Education: Goal: Knowledge of disease and its progression will improve Outcome: Progressing Goal: Individualized Educational Video(s) Outcome: Progressing   Problem: Fluid Volume: Goal: Compliance with measures to maintain balanced fluid volume will improve Outcome: Progressing   Problem: Health Behavior/Discharge Planning: Goal: Ability to manage health-related needs will improve Outcome: Progressing   Problem: Nutritional: Goal: Ability to make healthy dietary choices will improve Outcome: Progressing   Problem: Clinical Measurements: Goal: Complications related to the disease process, condition or treatment will be avoided or minimized Outcome: Progressing

## 2022-11-20 NOTE — Progress Notes (Signed)
   11/20/22 1920  Vitals  Temp 98.1 F (36.7 C)  Temp Source Oral  BP (!) 160/80  BP Location Right Arm  BP Method Automatic  Patient Position (if appropriate) Lying  Resp 20  Oxygen Therapy  SpO2 100 %  O2 Device Room Air  During Treatment Monitoring  Intra-Hemodialysis Comments Tx completed  Post Treatment  Dialyzer Clearance Lightly streaked  Duration of HD Treatment -hour(s) 3.5 hour(s)  Hemodialysis Intake (mL) 0 mL  Liters Processed 84  Fluid Removed (mL) 3800 mL  Tolerated HD Treatment Yes  Post-Hemodialysis Comments goal met.  AVG/AVF Arterial Site Held (minutes) 15 minutes  AVG/AVF Venous Site Held (minutes) 10 minutes  Fistula / Graft Right Forearm  No placement date or time found.   Placed prior to admission: Yes  Orientation: Right  Access Location: Forearm  Site Condition No complications  Fistula / Graft Assessment Present;Thrill;Bruit  Status Deaccessed  Needle Size 15  Drainage Description None

## 2022-11-20 NOTE — Progress Notes (Signed)
Pt. Refusing to have labs drawn, Dr. Janee Morn and Ozzie Hoyle, PA made aware

## 2022-11-20 NOTE — Progress Notes (Signed)
PROGRESS NOTE    [name]  ZOX:[id number] DOB: 1970/06/01 DOA: 11/16/2022 PCP: [name]    Chief Complaint  Patient presents with   Shortness of Breath    Brief Narrative:  52 y.o. 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  Admitted for MRSA bacteremia and discharged on 10/26/2022, who presented via EMS due to progressive shortness of breath throughout the course of the day.  EMS was activated.  Upon arrival the patient was hypoxic with O2 saturation of 85% on room air.  He was not wearing oxygen.  Was placed on 4 L nasal cannula by EMS with improvement of his O2 saturation up to 100%.  The patient missed hemodialysis on day of admission.Marland Kitchen  He was brought into the ED for further evaluation.    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 -COVID-19 PCR negative. -Slowly improving clinically. -Leukocytosis trended down with a white count of 5.7 on 11/18/2022. -Patient noted to be refusing labs over the past couple of days as well as refused IV cefepime overnight. -O2 weaned off currently 100% on room air. -Continue  empiric IV vancomycin, IV cefepime, DuoNebs as needed. -Supportive care.  2.  Acute hypoxic respiratory failure -Secondary to community-acquired pneumonia mild volume overload/mild CHF from missing hemodialysis. -Initially required 4 L nasal cannula. -O2 subsequently weaned and currently 100% on room air. -Continue treatment for pneumonia and HD if patient agrees. -Patient noted to have refused HD yesterday but per nephrology in agreement for hemodialysis today.  3.  ESRD on HD TTS -Noted to have missed HD session on Saturday, 11/16/2022. -Last HD session noted on Thursday, 11/14/2022. -Patient being followed by nephrology. -Patient noted to have refused hemodialysis last night but is in agreement for hemodialysis today. -Per nephrology.  4.  QTc prolongation -QTc on admission noted on 528. -Replete electrolytes. -Being monitored on telemetry. -Repeat EKG in the AM.  5.  Recent admission for MRSA bacteremia, TEE 10/25/2022 negative for infective endocarditis. -Patient noted to be on IV vancomycin with hemodialysis with a EOT 11/27/2022. -Continue IV vancomycin as recommended by ID to continue on hemodialysis. -Outpatient follow-up with ID.  6.  Chronic HFrEF 20 to 25% -Last 2D echo 10/22/2022 with a EF of 20 to 25% with left ventricular global hypokinesis. -Volume management via hemodialysis. -Continue home regimen Coreg. -Continue to hold Imdur. -Outpatient follow-up with cardiology.  7.  HTN -Continue Coreg. -Continue to hold Imdur. -On HD.  8.  Type 2 diabetes mellitus with hyperglycemia -Hemoglobin A1c 7.7 (09/08/2022) -CBG noted at 138 this morning. -Continue to hold home regimen Semglee. -SSI.  9.  Anemia of chronic disease -H&H stable.  10.  Homelessness -TOC consulted.   DVT prophylaxis: Heparin--- patient noted to be refusing. Code Status: Full Family Communication: Updated patient.  No family at bedside. Disposition: TBD  Status is: Inpatient Remains  inpatient appropriate because: Severity of illness/unsafe disposition   Consultants:  Nephrology: Dr.Schertz 11/17/2022  Procedures:  Chest x-ray 11/16/2022   Antimicrobials:  Anti-infectives (From admission, onward)    Start     Dose/Rate Route Frequency Ordered Stop   11/20/22 1315  vancomycin (VANCOREADY) IVPB 750 mg/150 mL        750 mg 150 mL/hr over 60 Minutes Intravenous  Once 11/20/22 1216     11/19/22 1800  vancomycin (VANCOREADY) IVPB 750 mg/150 mL        750 mg 150 mL/hr over 60 Minutes Intravenous Every T-Th-Sa (Hemodialysis) 11/17/22 0441     11/18/22 1500  vancomycin (VANCOCIN) IVPB 1000 mg/200 mL premix        1,000 mg 200 mL/hr over 60 Minutes Intravenous  Once 11/18/22 1357 11/18/22 1529   11/17/22 2200  ceFEPIme (MAXIPIME) 1 g in sodium chloride 0.9 % 100 mL IVPB        1 g 200 mL/hr over 30 Minutes Intravenous Every 24 hours 11/17/22 0149     11/17/22 0630  vancomycin (VANCOREADY) IVPB 500 mg/100 mL  Status:  Discontinued        500 mg 100 mL/hr over 60 Minutes Intravenous  Once 11/17/22 0619 11/18/22 1357   11/17/22 0000  ceFEPIme (MAXIPIME) 2 g in sodium chloride 0.9 % 100 mL IVPB        2 g 200 mL/hr over 30 Minutes Intravenous  Once 11/16/22 2346 11/17/22 0741         Subjective: Patient sitting up at the side of the bed.  Denies any chest pain or shortness of breath.  Feels his abdomen is bloated.  Noted to have refused hemodialysis last night as well as IV antibiotics.  States he is scheduled for hemodialysis today.  States needs to speak to [facility] in terms of homelessness and disposition.  Objective: Vitals:   11/20/22 1630 11/20/22 1700 11/20/22 1730 11/20/22 1800  BP: (!) 160/86 (!) 200/96 (!) 168/86 (!) 200/90  Pulse: 82 84 82 80  Resp: 20 20 20 20   Temp:      TempSrc:      SpO2:      Weight:      Height:       No intake or output data in the 24 hours ending 11/20/22 1800  Filed Weights   11/16/22 2011 11/17/22 0458  Weight: 70.5 kg 72.4  kg    Examination:  General exam: Appears calm and comfortable  Respiratory system: Clear to auscultation.  No wheezes, no crackles, no rhonchi.  Decreased breath sounds in the bases.  Respiratory effort normal. Cardiovascular system: S1 & S2 heard, RRR. No JVD, murmurs, rubs, gallops or clicks. No pedal edema. Gastrointestinal system: Abdomen is soft, minimal to mildly distended, nontender to palpation, positive bowel sounds.  No rebound.  No guarding.  Central nervous system: Alert and oriented. No focal neurological deficits. Extremities: Symmetric 5 x 5 power. Skin: No rashes, lesions or ulcers Psychiatry: Judgement and insight appear normal. Mood & affect appropriate.     Data Reviewed: I have personally reviewed following labs and imaging studies  CBC: Recent Labs  Lab 11/14/22 0056 11/16/22 2129 11/18/22 1243 11/20/22 1358  WBC  --  11.9* 5.7 5.3  NEUTROABS  --  9.2*  --   --  HGB 12.9* 9.4* 10.1* 10.1*  HCT 38.0* 31.6* 33.4* 34.1*  MCV  --  88.8 89.3 88.6  PLT  --  311 363 370    Basic Metabolic Panel: Recent Labs  Lab 11/14/22 0056 11/16/22 2129 11/17/22 0510 11/18/22 1240  NA 138 136 137 134*  K 4.0 3.7 4.1 3.7  CL 100 94* 95* 96*  CO2  --  24 23 22   GLUCOSE 167* 132* 112* 119*  BUN 39* 52* 56* 62*  CREATININE 6.90* 7.85* 8.22* 9.76*  CALCIUM  --  8.2* 8.4* 8.0*  MG  --   --  2.4  --   PHOS  --   --  5.0* 6.3*    GFR: Estimated Creatinine Clearance: 8.6 mL/min (A) (by C-G formula based on SCr of 9.76 mg/dL (H)).  Liver Function Tests: Recent Labs  Lab 11/17/22 0510 11/18/22 1240  ALBUMIN 3.1* 2.9*    CBG: Recent Labs  Lab 11/17/22 0107 11/17/22 0733 11/17/22 1152 11/18/22 1713 11/20/22 0757  GLUCAP 121* 138* 137* 94 108*     Recent Results (from the past 240 hour(s))  Culture, blood (routine x 2)     Status: None (Preliminary result)   Collection Time: 11/16/22  8:50 PM   Specimen: BLOOD  Result Value Ref Range Status    Specimen Description BLOOD RIGHT ANTECUBITAL  Final   Special Requests   Final    BOTTLES DRAWN AEROBIC AND ANAEROBIC Blood Culture adequate volume   Culture   Final    NO GROWTH 4 DAYS Performed at [facility], [address], [place], [place] [address]    Report Status PENDING  Incomplete  Culture, blood (routine x 2)     Status: None (Preliminary result)   Collection Time: 11/16/22  8:55 PM   Specimen: BLOOD  Result Value Ref Range Status   Specimen Description BLOOD SITE NOT SPECIFIED  Final   Special Requests   Final    BOTTLES DRAWN AEROBIC AND ANAEROBIC Blood Culture adequate volume   Culture   Final    NO GROWTH 4 DAYS Performed at [facility], [address] [place], [place] [address]    Report Status PENDING  Incomplete  SARS Coronavirus 2 by RT PCR (hospital order, performed in [facility] hospital lab) *cepheid single result test* Anterior Nasal Swab     Status: None   Collection Time: 11/17/22 11:23 AM   Specimen: Anterior Nasal Swab  Result Value Ref Range Status   SARS Coronavirus 2 by RT PCR NEGATIVE NEGATIVE Final    Comment: Performed at [facility], [address] [place], [place] [address]         Radiology Studies: No results found.      Scheduled Meds:  aspirin EC  81 mg Oral Daily   bisacodyl  10 mg Rectal Once   calcitRIOL  0.25 mcg Oral Once per day on Tue Thu Sat   calcium acetate  1,334 mg Oral TID WC   carvedilol  3.125 mg Oral BID WC   Chlorhexidine Gluconate Cloth  6 each Topical Q0600   heparin  2,000 Units Dialysis Once in dialysis   heparin  2,000 Units Dialysis Once in dialysis   heparin  5,000 Units Subcutaneous Q8H   insulin aspart  0-5 Units Subcutaneous QHS   insulin aspart  0-6 Units Subcutaneous TID WC   pantoprazole  40 mg Oral Daily   simethicone  80 mg Oral QID  Continuous Infusions:  ceFEPime (MAXIPIME) IV 1 g (11/18/22 2347)   vancomycin     vancomycin 750 mg (11/20/22 1750)     LOS: 4  days    Time spent: 35 minutes    Ramiro Harvest, MD [facility]   To contact the attending provider between 7A-7P or the covering provider during after hours 7P-7A, please log into the web site [email or web address] and access using universal [place] password for that web site. If you do not have the password, please call the hospital operator.  11/20/2022, 6:00 PM

## 2022-11-21 DIAGNOSIS — N186 End stage renal disease: Secondary | ICD-10-CM | POA: Diagnosis not present

## 2022-11-21 DIAGNOSIS — I5042 Chronic combined systolic (congestive) and diastolic (congestive) heart failure: Secondary | ICD-10-CM | POA: Diagnosis not present

## 2022-11-21 DIAGNOSIS — E1122 Type 2 diabetes mellitus with diabetic chronic kidney disease: Secondary | ICD-10-CM | POA: Diagnosis not present

## 2022-11-21 DIAGNOSIS — J189 Pneumonia, unspecified organism: Secondary | ICD-10-CM | POA: Diagnosis not present

## 2022-11-21 LAB — GLUCOSE, CAPILLARY: Glucose-Capillary: 173 mg/dL — ABNORMAL HIGH (ref 70–99)

## 2022-11-21 LAB — CULTURE, BLOOD (ROUTINE X 2)

## 2022-11-21 MED ORDER — ISOSORBIDE MONONITRATE ER 30 MG PO TB24
30.0000 mg | ORAL_TABLET | Freq: Every day | ORAL | Status: DC
  Administered 2022-11-21 – 2022-11-22 (×2): 30 mg via ORAL
  Filled 2022-11-21 (×2): qty 1

## 2022-11-21 MED ORDER — LEVOFLOXACIN 750 MG PO TABS
750.0000 mg | ORAL_TABLET | Freq: Once | ORAL | Status: AC
  Administered 2022-11-21: 750 mg via ORAL
  Filled 2022-11-21: qty 1

## 2022-11-21 MED ORDER — HEPARIN SODIUM (PORCINE) 1000 UNIT/ML DIALYSIS: 20.0000 [IU]/kg | INTRAMUSCULAR | Status: DC | PRN

## 2022-11-21 MED ORDER — SORBITOL 70 % SOLN
30.0000 mL | Status: AC
  Administered 2022-11-21 (×2): 30 mL via ORAL
  Filled 2022-11-21 (×2): qty 30

## 2022-11-21 MED ORDER — LEVOFLOXACIN 500 MG PO TABS: 500.0000 mg | ORAL_TABLET | ORAL | Status: DC

## 2022-11-21 NOTE — Progress Notes (Signed)
PHARMACY NOTE:  ANTIMICROBIAL RENAL DOSAGE ADJUSTMENT  Current antimicrobial regimen includes a mismatch between antimicrobial dosage and estimated renal function.  As per policy approved by the Pharmacy & Therapeutics and Medical Executive Committees, the antimicrobial dosage will be adjusted accordingly.  Current antimicrobial dosage:  levofloxacin 750mg  PO x3 days   Indication: pneumonia  Renal Function:  Estimated Creatinine Clearance: 9.6 mL/min (A) (by C-G formula based on SCr of 8.66 mg/dL (H)). [x]      On intermittent HD, scheduled: []      On CRRT    Antimicrobial dosage has been changed to: levofloxacin 750mg  x1 followed by levofloxacin 500mg  q48h x 1 after HD on HD days  Additional comments:   Thank you for allowing pharmacy to be a part of this patient's care.  Rexford Maus, PharmD, BCPS 11/21/2022 9:06 AM

## 2022-11-21 NOTE — Progress Notes (Signed)
Patient refusing HD at this time. Patient has refused HD twice today due to various reasons after transport arrived at bedside. Nephrology and HD notified.

## 2022-11-21 NOTE — Progress Notes (Signed)
After multiple attempts to get the pt to come to HD for tx----spoke with this pts primary RN---pt is not coming tonight--pt refusing---Nephrology to be made aware

## 2022-11-21 NOTE — Progress Notes (Signed)
[facility] Progress Note   Subjective:  Seen in room - resting in bed, will not speak or wake up for me today - although I think he is just ignoring me today since he looks comfortable with normal breathing and his muscle tone is normal, no odd posturing.  Did get dialysis yesterday.   Objective Vitals:   11/20/22 1830 11/20/22 1900 11/20/22 1920 11/20/22 1954  BP: (!) 180/90 (!) 180/80 (!) 160/80   Pulse: 84     Resp: 20 20 20    Temp:   98.1 F (36.7 C)   TempSrc:   Oral   SpO2:   100%   Weight:    66 kg  Height:       Physical Exam General: Chronically ill appearing man, NAD. Room air. Ignoring me today. Heart: RRR; no murmur Lungs: CTA anteriorly Extremities:Trace BLE edema Dialysis Access: AVG  Additional Objective Labs: Basic Metabolic Panel: Recent Labs  Lab 11/17/22 0510 11/18/22 1240 11/20/22 1358  NA 137 134* 134*  K 4.1 3.7 3.8  CL 95* 96* 95*  CO2 23 22 25   GLUCOSE 112* 119* 114*  BUN 56* 62* 44*  CREATININE 8.22* 9.76* 8.66*  CALCIUM 8.4* 8.0* 7.9*  PHOS 5.0* 6.3* 5.0*   Liver Function Tests: Recent Labs  Lab 11/17/22 0510 11/18/22 1240 11/20/22 1358  ALBUMIN 3.1* 2.9* 2.7*   CBC: Recent Labs  Lab 11/16/22 2129 11/18/22 1243 11/20/22 1358  WBC 11.9* 5.7 5.3  NEUTROABS 9.2*  --   --   HGB 9.4* 10.1* 10.1*  HCT 31.6* 33.4* 34.1*  MCV 88.8 89.3 88.6  PLT 311 363 370   Medications:  vancomycin      aspirin EC  81 mg Oral Daily   bisacodyl  10 mg Rectal Once   calcitRIOL  0.25 mcg Oral Once per day on Tue Thu Sat   calcium acetate  1,334 mg Oral TID WC   carvedilol  3.125 mg Oral BID WC   Chlorhexidine Gluconate Cloth  6 each Topical Q0600   heparin  5,000 Units Subcutaneous Q8H   insulin aspart  0-5 Units Subcutaneous QHS   insulin aspart  0-6 Units Subcutaneous TID WC   isosorbide mononitrate  60 mg Oral Daily   levofloxacin  750 mg Oral Daily   pantoprazole  40 mg Oral Daily   simethicone  80 mg Oral QID    sorbitol  30 mL Oral Q3H    Dialysis Orders: TS at [facility] 4hr, 400/800, EDW 61.5kg, 2K/2.5Ca, AVG, heparin 2000U bolus ~last HD 6/27, left at 70.4kg (9kg up) - Mircera IV q 2 weeks (last given 6/27) - Calcitriol 0.32mcg PO q HD   Assessment/Plan: PNA - R sided by CXR, admitted and started on IV vanc/ cefepime Acute hypoxic resp failure - due to pulm edema and/or pna. Refused HD 6/30, done 7/1 with 4L off, refused 7/2, then dialyzed 7/3 with 3.8L off - remains above dry weight, but closer.  ESRD: Usual TTS schedule - often missing HD and not getting anywhere close to his dry weight. Refusing HD on several occasions this admit, so has been dialyzed off his usual schedule this week but has had 2 HD this week so far. He won't wake up for me to discuss whether he will consent for HD today. Labs are stable, on room air - at this point, I think we can just pick up his next regular scheduled HD on Saturday 7/6 - hopefully as outpatient.  BP/volume:  BP high with edema, chronic overload issues. Anemia of ESRD: Hgb 12 -> 9.4 -> 10.1, not due for ESA yet. Secondary HPTH: Ca ok, Phos high - restarted binders (Phoslo is on OP med list) + VDRA. Recent admit/ MRSA bacteremia: S/p TEE 6/7 without endocarditis. Supposed to be getting Vancomycin 750mg  IV q HD until 11/27/22 (has missed some doses with missed HD).  HFrEF: Last echo with EF 20-25% Homelessness - ongoing problem for him  Dispo: Ok from renal standpoint   Ozzie Hoyle, PA-C 11/21/2022, 9:06 AM  [employer]

## 2022-11-21 NOTE — TOC Initial Note (Signed)
Transition of Care [facility]) - Initial/Assessment Note    Patient Details  Name: [name] MRN: [id number] Date of Birth: 1970-08-25  Transition [facility] Care [facility]) CM/SW Contact:    Ralene Bathe, LCSW Phone Number: 11/21/2022, 1:43 PM  Clinical Narrative:                 LCSW informed that patient is now requesting to speak with LCSW.  LCSW met with patient at bedside.  The patient inquired about homeless shelters.  LCSW contacted shelters in the area and surrounding areas and none had bed availability at this time.  LCSW informed the patient of this and informed the patient about the [facility]) on [address] In [place] where he can go to have a place to sleep, ect.    The patient then inquired about going to a skilled nursing facility.  LCSW informed that patient that the patient would not qualify for SNF as he declined physical therapy when they attempted to work with him.  The patient was previously at a SNF. When LCSW inquired as to why the patient is no longer at the facility, the patient replied "I didn't like it".  The patient reported that he did not have transportation at discharge.  LCSW informed the patient that he could be provided with a bus pass.  TOC following.    Patient Goals and CMS Choice            Expected Discharge Plan and Services In-house Referral: Clinical Social Work     Living arrangements for the past 2 months: Homeless Shelter                                      Prior Living Arrangements/Services Living arrangements for the past 2 months: Homeless Shelter Lives with:: Self Patient language and need for interpreter reviewed:: Yes        Need for Family Participation in Patient Care: No (Comment) Care giver support system in place?: No (comment)   Criminal Activity/Legal Involvement Pertinent to Current Situation/Hospitalization: No - Comment as needed  Activities of Daily Living      Permission  Sought/Granted                  Emotional Assessment Appearance:: Appears older than stated age Attitude/Demeanor/Rapport: Engaged Affect (typically observed): Calm Orientation: : Oriented to Situation, Oriented to  Time, Oriented to Place, Oriented to Self Alcohol / Substance Use: Not Applicable Psych Involvement: No (comment)  Admission diagnosis:  SOB (shortness of breath) [R06.02] ESRD (end stage renal disease) (HCC) [N18.6] CAP (community acquired pneumonia) [J18.9] Subareolar mass of left breast [N63.42] Community acquired pneumonia, unspecified laterality [J18.9] Patient Active Problem List   Diagnosis Date Noted   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   MRSA bacteremia 10/20/2022   High anion gap metabolic acidosis 10/20/2022   Fall 10/20/2022   GERD (gastroesophageal reflux disease) 10/20/2022   Hemodialysis AV fistula thrombosis, initial encounter (HCC) 09/11/2022   AV fistula occlusion (HCC) 09/11/2022   CAP (community acquired pneumonia) 05/20/2022   Chronic combined systolic and diastolic congestive heart failure (HCC) 05/20/2022   Anxiety disorder, unspecified 03/26/2022   Hypocalcemia 03/25/2022   Iron deficiency anemia, unspecified 03/20/2022   CHF (congestive heart failure) (HCC) 11/15/2021   Nausea vomiting and diarrhea 11/07/2021   Homelessness 10/13/2021   Acute hypoxic respiratory failure (HCC) 10/13/2021  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) 09/28/2021   Aspiration pneumonia (HCC) 09/28/2021   Volume overload 04/12/2021   Mild protein-calorie malnutrition (HCC) 03/06/2021   Aftercare including intermittent dialysis (HCC) 02/27/2021   Allergy, unspecified, initial encounter 02/27/2021   History of anemia due to chronic kidney disease 02/27/2021   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   Secondary hyperparathyroidism of renal origin (HCC) 02/27/2021   Shortness of breath 02/27/2021   Type 2 diabetes  mellitus with other diabetic kidney complication (HCC) 02/27/2021   Hypertensive urgency 02/18/2021   Type 2 diabetes mellitus (HCC) 02/17/2021   Acute on chronic combined systolic and diastolic CHF (congestive heart failure) (HCC) 11/29/2020   Hypertensive crisis 11/27/2020   Stage 4 chronic kidney disease (HCC) 02/22/2020   Left ventricular dysfunction 04/27/2019   Hemoglobin A1C greater than 9%, indicating poor diabetic control 07/29/2018   LBBB (left bundle branch block)    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   Hyperlipidemia LDL goal <70 11/24/2008   DM2 (diabetes mellitus, type 2) (HCC) 07/17/2006   T2DM (type 2 diabetes mellitus) (HCC) 07/17/2006   ESRD (end stage renal disease) (HCC) 07/17/2006   Essential hypertension 07/17/2006   PCP:  [name], Clinic Pharmacy:   [facility] [address] [place] [address] Phone: [phone] Fax: [phone]  [facility] - [place], [place] - [address] [facility] Floor [address] [place] [address] Phone: [phone] Fax: [phone]     Social Determinants of Health (SDOH) Social History: SDOH Screenings   Food Insecurity: Food Insecurity Present (05/08/2022)  Housing: High Risk (10/21/2022)  Transportation Needs: Unmet Transportation Needs (10/21/2022)  Utilities: Not At Risk (10/21/2022)  Depression (PHQ2-9): Low Risk  (10/13/2019)  Financial Resource Strain: High Risk (05/08/2022)  Tobacco Use: Low Risk  (11/16/2022)   SDOH Interventions:     Readmission Risk Interventions    10/22/2022    2:17 PM 11/07/2021    4:21 PM 02/19/2021    3:52 PM  Readmission Risk Prevention Plan  Transportation Screening Complete Complete Complete  Medication Review Oceanographer) Complete Complete Complete  PCP or Specialist appointment within 3-5 days of discharge Complete Complete Complete  HRI or Home Care  Consult Complete Complete Complete  SW Recovery Care/Counseling Consult Complete Complete Complete  Palliative Care Screening Not Applicable Not Applicable Not Applicable  Skilled Nursing Facility Complete Not Applicable Not Applicable

## 2022-11-21 NOTE — Progress Notes (Signed)
PROGRESS NOTE    [name]  ZOX:[id number] DOB: Jul 02, 1970 DOA: 11/16/2022 PCP: [name], Clinic    Chief Complaint  Patient presents with   Shortness of Breath    Brief Narrative:  52 y.o. male with medical history significant for ESRD on HD TTS, chronic HFrEF 20 to 25%, recently on 2L [place] during his last hospitalization earlier this month, type 2 diabetes, essential hypertension, anemia of chronic disease, admission at outside facility in [place] for MRSA bacteremia, left AMA and presented at our facility on 10/20/2022.  Admitted for MRSA bacteremia and discharged on 10/26/2022, who presented via EMS due to progressive shortness of breath throughout the course of the day.  EMS was activated.  Upon arrival the patient was hypoxic with O2 saturation of 85% on room air.  He was not wearing oxygen.  Was placed on 4 L nasal cannula by EMS with improvement of his O2 saturation up to 100%.  The patient missed hemodialysis on day of admission.Marland Kitchen  He was brought into the ED for further evaluation.    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 -COVID-19 PCR negative. -Clinical improvement. -Leukocytosis trended down with a white count of 5.3 on 11/20/2022.   -Patient noted to be refusing labs over the past couple of days as well as refused IV cefepime. -O2 weaned off currently 93-100% on room air. -Continue empiric IV  vancomycin. -Transition from IV cefepime to oral Levaquin to complete course of antibiotic treatment. -DuoNebs as needed. -Supportive care.  2.  Acute hypoxic respiratory failure -Secondary to community-acquired pneumonia mild volume overload/mild CHF from missing hemodialysis. -Initially required 4 L nasal cannula. -O2 subsequently weaned and currently 93% on room air. -Continue treatment for pneumonia and HD if patient agrees. -Patient noted to have refused HD the evening of 11/19/2022 however underwent hemodialysis yesterday, 11/20/2022.   3.  ESRD on HD TTS -Noted to have missed HD session on Saturday, 11/16/2022. -Last HD session noted on Thursday, 11/14/2022. -Patient being followed by nephrology. -Patient noted to have refused hemodialysis the evening of 11/19/2022 however underwent hemodialysis yesterday 11/20/2022.  -Today's patient's regular dialysis day and patient hopefully for hemodialysis today per nephrology.  -Per nephrology.  4.  QTc prolongation -QTc on admission noted on 528. -Replete electrolytes. -Being monitored on telemetry. -Repeat EKG pending.   5.  Recent admission for MRSA bacteremia, TEE 10/25/2022 negative for infective endocarditis. -Patient noted to be on IV vancomycin with hemodialysis with a EOT 11/27/2022. -Continue IV vancomycin as recommended by ID to continue on hemodialysis. -Outpatient follow-up with ID.  6.  Chronic HFrEF 20 to 25% -Last 2D echo 10/22/2022 with a EF of 20 to 25% with left ventricular global hypokinesis. -Volume management via hemodialysis. -Continue home regimen Coreg, Imdur. -Outpatient follow-up with cardiology.  7.  HTN -Continue Coreg, Imdur. -On HD.  8.  Type 2 diabetes mellitus with hyperglycemia -Hemoglobin A1c 7.7 (09/08/2022) -CBG noted at 108 this  morning. -Continue to hold home regimen Semglee. -SSI.  9.  Anemia of chronic disease -H&H stable.    10.  Homelessness -TOC consulted.   DVT prophylaxis: Heparin---  patient noted to be refusing. Code Status: Full Family Communication: Updated patient.  No family at bedside. Disposition: TBD/??  Homelessness  Status is: Inpatient Remains inpatient appropriate because: Severity of illness/unsafe disposition   Consultants:  Nephrology: Dr.Schertz 11/17/2022  Procedures:  Chest x-ray 11/16/2022   Antimicrobials:  Anti-infectives (From admission, onward)    Start     Dose/Rate Route Frequency Ordered Stop   11/23/22 1800  levofloxacin (LEVAQUIN) tablet 500 mg        500 mg Oral Every 48 hours 11/21/22 0909 11/25/22 1759   11/21/22 1800  levofloxacin (LEVAQUIN) tablet 750 mg        750 mg Oral  Once 11/21/22 0851     11/20/22 1315  vancomycin (VANCOREADY) IVPB 750 mg/150 mL        750 mg 150 mL/hr over 60 Minutes Intravenous  Once 11/20/22 1216 11/20/22 2129   11/19/22 1800  vancomycin (VANCOREADY) IVPB 750 mg/150 mL        750 mg 150 mL/hr over 60 Minutes Intravenous Every T-Th-Sa (Hemodialysis) 11/17/22 0441 11/27/22 2359   11/18/22 1500  vancomycin (VANCOCIN) IVPB 1000 mg/200 mL premix        1,000 mg 200 mL/hr over 60 Minutes Intravenous  Once 11/18/22 1357 11/18/22 1529   11/17/22 2200  ceFEPIme (MAXIPIME) 1 g in sodium chloride 0.9 % 100 mL IVPB  Status:  Discontinued        1 g 200 mL/hr over 30 Minutes Intravenous Every 24 hours 11/17/22 0149 11/21/22 0851   11/17/22 0630  vancomycin (VANCOREADY) IVPB 500 mg/100 mL  Status:  Discontinued        500 mg 100 mL/hr over 60 Minutes Intravenous  Once 11/17/22 0619 11/18/22 1357   11/17/22 0000  ceFEPIme (MAXIPIME) 2 g in sodium chloride 0.9 % 100 mL IVPB        2 g 200 mL/hr over 30 Minutes Intravenous  Once 11/16/22 2346 11/17/22 0741         Subjective: Patient laying in bed.  Denies any chest pain or shortness of breath.  Feels abdominal bloating is improving.  States he did not refuse vitals this morning and they already got vitals.  Noted to have refused labs this morning likely  because patient feels it is too early.  In agreement to have labs done tomorrow morning.  Asking to speak to Child psychotherapist.  Objective: Vitals:   11/20/22 1900 11/20/22 1920 11/20/22 1954 11/21/22 0925  BP: (!) 180/80 (!) 160/80  (!) 147/78  Pulse:    76  Resp: 20 20  16   Temp:  98.1 F (36.7 C)    TempSrc:  Oral    SpO2:  100%  93%  Weight:   66 kg   Height:        Intake/Output Summary (Last 24 hours) at 11/21/2022 1209 Last data filed at 11/21/2022 0600 Gross per 24 hour  Intake 120 ml  Output 3800 ml  Net -3680 ml    Filed Weights   11/16/22 2011 11/17/22 0458 11/20/22 1954  Weight: 70.5 kg 72.4 kg 66 kg    Examination:  General exam: NAD.  Respiratory system: CTAB.  No wheezes, no crackles, no rhonchi.  Fair air movement.  Speaking in full sentences.  Cardiovascular system: RRR no murmurs rubs or gallops.  No JVD.  No lower extremity edema.  Gastrointestinal system: Abdomen is soft, obese, nontender to palpation.  Positive bowel sounds.  No rebound.  No guarding.  Central nervous system: Alert and oriented. No focal neurological deficits. Extremities: Symmetric 5 x 5 power. Skin: No rashes, lesions or ulcers Psychiatry: Judgement and insight appear normal. Mood & affect appropriate.     Data Reviewed: I have personally reviewed following labs and imaging studies  CBC: Recent Labs  Lab 11/16/22 2129 11/18/22 1243 11/20/22 1358  WBC 11.9* 5.7 5.3  NEUTROABS 9.2*  --   --   HGB 9.4* 10.1* 10.1*  HCT 31.6* 33.4* 34.1*  MCV 88.8 89.3 88.6  PLT 311 363 370     Basic Metabolic Panel: Recent Labs  Lab 11/16/22 2129 11/17/22 0510 11/18/22 1240 11/20/22 1358  NA 136 137 134* 134*  K 3.7 4.1 3.7 3.8  CL 94* 95* 96* 95*  CO2 24 23 22 25   GLUCOSE 132* 112* 119* 114*  BUN 52* 56* 62* 44*  CREATININE 7.85* 8.22* 9.76* 8.66*  CALCIUM 8.2* 8.4* 8.0* 7.9*  MG  --  2.4  --   --   PHOS  --  5.0* 6.3* 5.0*     GFR: Estimated Creatinine Clearance: 9.6 mL/min  (A) (by C-G formula based on SCr of 8.66 mg/dL (H)).  Liver Function Tests: Recent Labs  Lab 11/17/22 0510 11/18/22 1240 11/20/22 1358  ALBUMIN 3.1* 2.9* 2.7*     CBG: Recent Labs  Lab 11/17/22 0733 11/17/22 1152 11/18/22 1713 11/20/22 0757 11/21/22 1149  GLUCAP 138* 137* 94 108* 173*      Recent Results (from the past 240 hour(s))  Culture, blood (routine x 2)     Status: None   Collection Time: 11/16/22  8:50 PM   Specimen: BLOOD  Result Value Ref Range Status   Specimen Description BLOOD RIGHT ANTECUBITAL  Final   Special Requests   Final    BOTTLES DRAWN AEROBIC AND ANAEROBIC Blood Culture adequate volume   Culture   Final    NO GROWTH 5 DAYS Performed at [facility], [address] [place], [place] [address]    Report Status 11/21/2022 FINAL  Final  Culture, blood (routine x 2)     Status: None   Collection Time: 11/16/22  8:55 PM   Specimen: BLOOD  Result Value Ref Range Status   Specimen Description BLOOD SITE NOT SPECIFIED  Final   Special Requests   Final    BOTTLES DRAWN AEROBIC AND ANAEROBIC Blood Culture adequate volume   Culture   Final    NO GROWTH 5 DAYS Performed at [facility], [address], [place], [place] [address]    Report Status 11/21/2022 FINAL  Final  SARS Coronavirus 2 by RT PCR (hospital order, performed in [facility] hospital lab) *cepheid single result test* Anterior Nasal Swab     Status: None   Collection Time: 11/17/22 11:23 AM   Specimen: Anterior Nasal Swab  Result Value Ref Range Status   SARS Coronavirus 2 by RT PCR NEGATIVE NEGATIVE Final    Comment: Performed at [facility], [address], [place], [place] [address]         Radiology Studies: No results found.      Scheduled Meds:  aspirin EC  81 mg Oral Daily   bisacodyl  10 mg Rectal Once   calcitRIOL  0.25 mcg Oral Once per day on Tue Thu Sat  calcium acetate  1,334 mg Oral TID WC   carvedilol  3.125 mg Oral BID WC    Chlorhexidine Gluconate Cloth  6 each Topical Q0600   heparin  5,000 Units Subcutaneous Q8H   insulin aspart  0-5 Units Subcutaneous QHS   insulin aspart  0-6 Units Subcutaneous TID WC   isosorbide mononitrate  30 mg Oral Daily   [START ON 11/23/2022] levofloxacin  500 mg Oral Q48H   levofloxacin  750 mg Oral Once   pantoprazole  40 mg Oral Daily   simethicone  80 mg Oral QID   Continuous Infusions:  vancomycin       LOS: 5 days    Time spent: 35 minutes    Ramiro Harvest, MD [facility]   To contact the attending provider between 7A-7P or the covering provider during after hours 7P-7A, please log into the web site [email or web address] and access using universal [place] password for that web site. If you do not have the password, please call the hospital operator.  11/21/2022, 12:09 PM

## 2022-11-21 NOTE — Progress Notes (Signed)
Patient refused lab draw this am.

## 2022-11-22 ENCOUNTER — Other Ambulatory Visit (HOSPITAL_COMMUNITY): Payer: Self-pay

## 2022-11-22 ENCOUNTER — Encounter (HOSPITAL_COMMUNITY): Payer: Self-pay | Admitting: Pharmacist Clinician (PhC)/ Clinical Pharmacy Specialist

## 2022-11-22 DIAGNOSIS — J9601 Acute respiratory failure with hypoxia: Secondary | ICD-10-CM | POA: Diagnosis not present

## 2022-11-22 DIAGNOSIS — N189 Chronic kidney disease, unspecified: Secondary | ICD-10-CM

## 2022-11-22 DIAGNOSIS — E1122 Type 2 diabetes mellitus with diabetic chronic kidney disease: Secondary | ICD-10-CM | POA: Diagnosis not present

## 2022-11-22 DIAGNOSIS — R0602 Shortness of breath: Secondary | ICD-10-CM | POA: Diagnosis not present

## 2022-11-22 DIAGNOSIS — B9562 Methicillin resistant Staphylococcus aureus infection as the cause of diseases classified elsewhere: Secondary | ICD-10-CM | POA: Diagnosis not present

## 2022-11-22 DIAGNOSIS — N186 End stage renal disease: Secondary | ICD-10-CM | POA: Diagnosis not present

## 2022-11-22 DIAGNOSIS — D631 Anemia in chronic kidney disease: Secondary | ICD-10-CM | POA: Diagnosis not present

## 2022-11-22 DIAGNOSIS — J189 Pneumonia, unspecified organism: Secondary | ICD-10-CM | POA: Diagnosis not present

## 2022-11-22 DIAGNOSIS — J810 Acute pulmonary edema: Secondary | ICD-10-CM | POA: Diagnosis not present

## 2022-11-22 DIAGNOSIS — Z992 Dependence on renal dialysis: Secondary | ICD-10-CM | POA: Diagnosis not present

## 2022-11-22 DIAGNOSIS — K219 Gastro-esophageal reflux disease without esophagitis: Secondary | ICD-10-CM | POA: Diagnosis not present

## 2022-11-22 DIAGNOSIS — R7881 Bacteremia: Secondary | ICD-10-CM | POA: Diagnosis not present

## 2022-11-22 DIAGNOSIS — I5042 Chronic combined systolic (congestive) and diastolic (congestive) heart failure: Secondary | ICD-10-CM | POA: Diagnosis not present

## 2022-11-22 DIAGNOSIS — E8779 Other fluid overload: Secondary | ICD-10-CM | POA: Diagnosis not present

## 2022-11-22 DIAGNOSIS — I120 Hypertensive chronic kidney disease with stage 5 chronic kidney disease or end stage renal disease: Secondary | ICD-10-CM | POA: Diagnosis not present

## 2022-11-22 DIAGNOSIS — Z862 Personal history of diseases of the blood and blood-forming organs and certain disorders involving the immune mechanism: Secondary | ICD-10-CM

## 2022-11-22 LAB — RENAL FUNCTION PANEL
Albumin: 2.9 g/dL — ABNORMAL LOW (ref 3.5–5.0)
Anion gap: 16 — ABNORMAL HIGH (ref 5–15)
BUN: 38 mg/dL — ABNORMAL HIGH (ref 6–20)
CO2: 25 mmol/L (ref 22–32)
Calcium: 8.6 mg/dL — ABNORMAL LOW (ref 8.9–10.3)
Chloride: 97 mmol/L — ABNORMAL LOW (ref 98–111)
Creatinine, Ser: 7.75 mg/dL — ABNORMAL HIGH (ref 0.61–1.24)
GFR, Estimated: 8 mL/min — ABNORMAL LOW (ref >60)
Glucose, Bld: 143 mg/dL — ABNORMAL HIGH (ref 70–99)
Phosphorus: 4.4 mg/dL (ref 2.5–4.6)
Potassium: 4.2 mmol/L (ref 3.5–5.1)
Sodium: 138 mmol/L (ref 135–145)

## 2022-11-22 LAB — CBC
HCT: 34.9 % — ABNORMAL LOW (ref 39.0–52.0)
Hemoglobin: 10.2 g/dL — ABNORMAL LOW (ref 13.0–17.0)
MCH: 26.4 pg (ref 26.0–34.0)
MCHC: 29.2 g/dL — ABNORMAL LOW (ref 30.0–36.0)
MCV: 90.2 fL (ref 80.0–100.0)
Platelets: 316 10*3/uL (ref 150–400)
RBC: 3.87 MIL/uL — ABNORMAL LOW (ref 4.22–5.81)
RDW: 16.6 % — ABNORMAL HIGH (ref 11.5–15.5)
WBC: 6.2 10*3/uL (ref 4.0–10.5)
nRBC: 0 % (ref 0.0–0.2)

## 2022-11-22 LAB — GLUCOSE, CAPILLARY: Glucose-Capillary: 155 mg/dL — ABNORMAL HIGH (ref 70–99)

## 2022-11-22 MED ORDER — VANCOMYCIN HCL 750 MG/150ML IV SOLN
750.0000 mg | INTRAVENOUS | Status: DC
  Filled 2022-11-22 (×3): qty 150

## 2022-11-22 MED ORDER — HEPARIN SODIUM (PORCINE) 1000 UNIT/ML DIALYSIS: 2000.0000 [IU] | Freq: Once | INTRAMUSCULAR | Status: DC

## 2022-11-22 MED ORDER — SIMETHICONE 80 MG PO CHEW
80.0000 mg | CHEWABLE_TABLET | Freq: Four times a day (QID) | ORAL | 0 refills | Status: AC
  Filled 2022-11-22: qty 100, 25d supply, fill #0

## 2022-11-22 MED ORDER — LEVOFLOXACIN 500 MG PO TABS
500.0000 mg | ORAL_TABLET | ORAL | 0 refills | Status: AC
  Filled 2022-11-22: qty 1, 2d supply, fill #0

## 2022-11-22 NOTE — Progress Notes (Signed)
[facility] Progress Note    Dialysis Orders: TS at [facility] 4hr, 400/800, EDW 61.5kg, 2K/2.5Ca, AVG, heparin 2000U bolus ~last HD 6/27, left at 70.4kg (9kg up) - Mircera IV q 2 weeks (last given 6/27) - Calcitriol 0.73mcg PO q HD   Assessment/Plan: PNA - R sided by CXR, admitted and started on IV vanc/ cefepime Acute hypoxic resp failure - due to pulm edema and/or pna. Refused HD 6/30, done 7/1 with 4L off, refused 7/2, then dialyzed 7/3 with 3.8L off - remains above dry weight, but closer.  ESRD: Usual TTS schedule - often missing HD and not getting anywhere close to his dry weight. Refusing HD on several occasions this admit, so has been dialyzed off his usual schedule this week but has had 2 HD this week so far. Notified unit that he prefers to go to dialysis at noon so he won't miss breakfast. Will also write orders for tomorrow in case he's here but hopefully he can go to his regular scheduled HD on Saturday 7/6  BP/volume: BP high with edema, chronic overload issues. Anemia of ESRD: Hgb 12 -> 9.4 -> 10.1, not due for ESA yet. Secondary HPTH: Ca ok, Phos high - restarted binders (Phoslo is on OP med list) + VDRA. Recent admit/ MRSA bacteremia: S/p TEE 6/7 without endocarditis. Supposed to be getting Vancomycin 750mg  IV q HD until 11/27/22 (has missed some doses with missed HD).  HFrEF: Last echo with EF 20-25% Homelessness - ongoing problem for him  Dispo: Ok from renal standpoint   Subjective:  Seen in room - refused HD overnight and only interested in breakfast this AM, prefers to go at noon. He kept asking if he was going to get to eat breakfast before HD.   Objective Vitals:   11/20/22 1900 11/20/22 1920 11/20/22 1954 11/21/22 0925  BP: (!) 180/80 (!) 160/80  (!) 147/78  Pulse:    76  Resp: 20 20  16   Temp:  98.1 F (36.7 C)    TempSrc:  Oral    SpO2:  100%  93%  Weight:   66 kg   Height:       Physical Exam General: Chronically ill appearing man,  NAD. Room air Heart: RRR; no murmur Lungs: CTA anteriorly Extremities:Trace BLE edema Dialysis Access: AVG  Additional Objective Labs: Basic Metabolic Panel: Recent Labs  Lab 11/17/22 0510 11/18/22 1240 11/20/22 1358  NA 137 134* 134*  K 4.1 3.7 3.8  CL 95* 96* 95*  CO2 23 22 25   GLUCOSE 112* 119* 114*  BUN 56* 62* 44*  CREATININE 8.22* 9.76* 8.66*  CALCIUM 8.4* 8.0* 7.9*  PHOS 5.0* 6.3* 5.0*   Liver Function Tests: Recent Labs  Lab 11/17/22 0510 11/18/22 1240 11/20/22 1358  ALBUMIN 3.1* 2.9* 2.7*   CBC: Recent Labs  Lab 11/16/22 2129 11/18/22 1243 11/20/22 1358  WBC 11.9* 5.7 5.3  NEUTROABS 9.2*  --   --   HGB 9.4* 10.1* 10.1*  HCT 31.6* 33.4* 34.1*  MCV 88.8 89.3 88.6  PLT 311 363 370   Medications:  vancomycin      aspirin EC  81 mg Oral Daily   bisacodyl  10 mg Rectal Once   calcitRIOL  0.25 mcg Oral Once per day on Tue Thu Sat   calcium acetate  1,334 mg Oral TID WC   carvedilol  3.125 mg Oral BID WC   Chlorhexidine Gluconate Cloth  6 each Topical Q0600   heparin  5,000 Units  Subcutaneous Q8H   insulin aspart  0-5 Units Subcutaneous QHS   insulin aspart  0-6 Units Subcutaneous TID WC   isosorbide mononitrate  30 mg Oral Daily   [START ON 11/23/2022] levofloxacin  500 mg Oral Q48H   pantoprazole  40 mg Oral Daily   simethicone  80 mg Oral QID

## 2022-11-22 NOTE — Progress Notes (Signed)
Advised by CSW that pt will d/c after HD. Contacted [facility] and spoke to [employer]. Clinic advised pt is to d/c after HD today and should resume care tomorrow. 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   Olivia Canter Renal Navigator [phone]

## 2022-11-22 NOTE — Discharge Summary (Signed)
Physician Discharge Summary  [name] NWG:[id number] DOB: 03/04/1971 DOA: 11/16/2022  [facility]P: [name]  Admit date: 11/16/2022 Discharge date: 11/22/2022  Time spent: 60 minutes  Recommendations for Outpatient Follow-up:  Follow-up in hemodialysis as scheduled on 11/23/2022. Follow-up with [name] in 1 to 2 weeks.   Discharge Diagnoses:  Active Problems:   Acute hypoxic respiratory failure (HCC)   ESRD (end stage renal disease) (HCC)   T2DM (type 2 diabetes mellitus) (HCC)   History of anemia due to chronic kidney disease   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)   CAP (community acquired pneumonia)   Chronic combined systolic and diastolic congestive heart failure (HCC)   MRSA bacteremia   GERD (gastroesophageal reflux disease)   Discharge Condition: Stable and improved.  Diet recommendation: Heart healthy  Filed Weights   11/16/22 2011 11/17/22 0458 11/20/22 1954  Weight: 70.5 kg 72.4 kg 66 kg    History of present illness:  HPI per Dr. Tyson Alias is a 52 y.o. male with medical history significant for ESRD on HD TTS, chronic HFrEF 20 to 25%, recently on 2L [place] during his last hospitalization earlier this month, type 2 diabetes, essential hypertension, anemia of chronic disease, admission at outside facility in [place] for MRSA bacteremia, left AMA and presented at our facility on 10/20/2022.  Admitted for MRSA bacteremia and discharged on 10/26/2022, who presented via EMS due to progressive shortness of breath throughout the course of the day.  EMS was activated.  Upon arrival the patient was hypoxic with O2 saturation of 85% on room air.  He was not wearing oxygen.  Was placed on 4 L nasal cannula by EMS with improvement of his O2 saturation up to 100%.  The patient missed hemodialysis today.  He was brought into the ED for further evaluation.   In the ED, somnolent, arouses to voices and not cooperative.  Not answering questions.  Vital signs  notable for low-grade fever 99.5 with mild leukocytosis 11.9K.  O2 saturation improved on 4 L.  Chest x-ray revealed changes consistent with multifocal infiltrates on the right.  Also changes of mild CHF.  Due to concern for right-sided pneumonia, EDP requested admission for further management.  The patient was admitted by [facility], hospitalist service, to telemetry medical unit as inpatient status.  Started on IV vancomycin and cefepime.  Nephrology consulted by EDP to resume hemodialysis.   ED Course: Tmax 99.5.  BP 142/82, pulse 80, respiration rate 18, O2 saturation 100% on 4 L.  Lab studies remarkable for WBC 11.9, hemoglobin 9.4, platelet count 311.  Serum glucose 132, serum bicarb 24, potassium 3.7, anion gap 18, BUN 52, creatinine 7.85.  GFR 8.  Hospital Course:  #1 right-sided community-acquired pneumonia, POA - serial x-ray reviewed on admission with concerns for right middle and right lower lobe pulmonary infiltrate suggestive of pneumonia. -COVID-19 PCR negative. -Patient placed empirically on IV antibiotics of vancomycin which she was on prior to admission in addition to IV cefepime.   -Leukocytosis trended down with a white count of 5.3 on 11/20/2022.   -Patient noted to be refusing labs during the hospitalization and also some instances of refusal of IV antibiotics.   -IV cefepime subsequently transition to oral Levaquin and patient be discharged on 1 more day of oral Levaquin to complete a course of antibiotic treatment.   -Oxygenation improved O2 was weaned down and patient was 98% on room air by day of discharge.   -Patient  be discharged in stable improved condition.   -Outpatient follow-up with PCP.     2.  Acute hypoxic respiratory failure -Secondary to community-acquired pneumonia mild volume overload/mild CHF from missing hemodialysis. -Initially required 4 L nasal cannula. -O2 subsequently weaned and patient noted at 98% on room air by day of discharge.   -Patient was maintained  on treatment for pneumonia and HD if patient agrees. -Patient noted to have had several instances during the hospitalization where he refused hemodialysis.   -Patient was in agreement to go for hemodialysis on day of discharge.   -Patient be discharged on 1 more day of antibiotics to complete course of antibiotic treatment for pneumonia.    3.  ESRD on HD TTS -Noted to have missed HD session on Saturday, 11/16/2022. -Last HD session noted on Thursday, 11/14/2022. -Patient being followed by nephrology. -Patient noted to have refused hemodialysis the evening of 11/19/2022 however underwent hemodialysis 11/20/2022.  -Patient noted to have also refused hemodialysis on 11/21/2022 however was in agreement for hemodialysis on day of discharge 11/22/2022. -Patient had several instances during the hospitalization of refusal of care. -Patient was followed by nephrology during the hospitalization and cleared by nephrology for discharge after hemodialysis on day of discharge. -Outpatient follow-up irregular hemodialysis center on 11/23/2022.   4.  QTc prolongation -QTc on admission noted on 528. -Electrolytes repleted.   -Patient monitored on telemetry.   -Outpatient follow-up.    5.  Recent admission for MRSA bacteremia, TEE 10/25/2022 negative for infective endocarditis. -Patient noted to be on IV vancomycin with hemodialysis with a EOT 11/27/2022. -Patient was continued on IV vancomycin as recommended by ID to continue on hemodialysis. -Outpatient follow-up with ID.   6.  Chronic HFrEF 20 to 25% -Last 2D echo 10/22/2022 with a EF of 20 to 25% with left ventricular global hypokinesis. -Volume management via hemodialysis. -Patient maintained on home regimen Coreg, Imdur.   -Outpatient follow-up with primary cardiologist as previously scheduled.    7.  HTN -Patient maintained on home regimen Coreg, Imdur.   -Patient also noted to be on hemodialysis.   -Patient had several instances of refusal to take some of  his medications and some refusal of care during the hospitalization.  -Outpatient follow-up with PCP.     8.  Type 2 diabetes mellitus with hyperglycemia -Hemoglobin A1c 7.7 (09/08/2022) -Home regimen Semglee was held during the hospitalization and patient maintained on sliding scale insulin.   -Patient be discharged back on home regimen of Semglee and SSI.    9.  Anemia of chronic disease -H&H stable.     10.  Homelessness -TOC consulted and patient given resources.  Procedures: Chest x-ray 11/16/2022  Consultations: Nephrology: Dr.Schertz 11/17/2022   Discharge Exam: Vitals:   11/22/22 1345 11/22/22 1400  BP: (!) 157/91 (!) 157/81  Pulse: 79 79  Resp: 18 16  Temp: 97.6 F (36.4 C)   SpO2: 96% 98%    General: NAD Cardiovascular: RRR no murmurs rubs or gallops.  No JVD.  No lower extremity edema. Respiratory: Clear to auscultation bilaterally.  Some decreased breath sounds in the bases.  Discharge Instructions   Discharge Instructions     Diet - low sodium heart healthy   Complete by: As directed    Increase activity slowly   Complete by: As directed    No wound care   Complete by: As directed       Allergies as of 11/22/2022       Reactions   Sulfa  Antibiotics Rash   Severe rash. Severe rash.     Severe rash.   Other Other (See Comments)   Patient states he is allergic to the TB test - unknown reaction during childhood    Bactrim [sulfamethoxazole-trimethoprim] Rash   Cephalexin Other (See Comments), Rash   Patient with severe drug reaction including exfoliating skin rash and hypotension after being prescribed both cephalexin and sulfamethoxazole-trimethoprim simultaneously. Favor SMX as much more likely culprit as patient had tolerated other cephalosporins in the past, but cannot say with complete certainty that this was not related to cephalexin. Other Reaction(s): Other, Other (See Comments) Patient with severe drug reaction including exfoliating skin rash  and hypotension after being prescribed both cephalexin and sulfamethoxazole-trimethoprim simultaneously. Favor SMX as much more likely culprit as patient had tolerated other cephalosporins in the past, but cannot say with complete certainty that this was not related to cephalexin.   "Patient with severe drug reaction including exfoliating skin rash and hypotension after being prescribed both cephalexin and sulfamethoxazole-trimethoprim simultaneously. Favor SMX as much more likely culprit as patient had tolerated other cephalosporins in the past, but cannot say with complete certainty that this was not related to cephalexin." As noted in [facility]   "Patient with severe drug reaction including exfoliating skin rash and hypotension after being prescribed both cephalexin and sulfamethoxazole-trimethoprim simultaneously. Favor SMX as much more likely culprit as patient had tolerated other cephalosporins in the past, but cannot say with complete certainty that this was not related to cephalexin." As noted in [facility]  "Patient with severe drug reaction including exfoliating skin rash and hypotension after being prescribed both cephalexin and sulfamethoxazole-trimethoprim simultaneously. Favor SMX as much more likely culprit as patient had tolerated other cephalosporins in the past, but cannot say with complete certainty that this was not related to cephalexin." As noted in [facility]     Patient with severe drug reaction including exfoliating skin rash and hypotension after being prescribed both cephalexin and sulfamethoxazole-trimethoprim simultaneously. Favor SMX as much more likely culprit as patient had tolerated other cephalosporins in the past, but cannot say with complete certainty that this was not related to cephalexin.  "Patient with severe drug reaction including exfoliating skin rash and hypotension after being prescribed both cephalexin and sulfamethoxazole-trimethoprim simultaneously. Favor SMX  as much more likely culprit as patient had tolerated other cephalosporins in the past, but cannot say with complete certainty that this was not related to cephalexin." As noted in [facility]  "Patient with severe drug reaction including exfoliating skin rash and hypotension after being prescribed both cephalexin and sulfamethoxazole-trimethoprim simultaneously. Favor SMX as much more likely culprit as patient had tolerated other cephalosporins in the past, but cannot say with complete certainty that this was not related to cephalexin." As noted in [facility]     Patient with severe drug reaction inc... (TRUNCATED)        Medication List     STOP taking these medications    labetalol 100 MG tablet Commonly known as: NORMODYNE       TAKE these medications    Accu-Chek Guide test strip Generic drug: glucose blood Use to check fasting blood sugar once daily. 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   Aspirin Low Dose 81 MG tablet Generic drug: aspirin EC Take 1 tablet (81 mg total) by mouth daily.   benzonatate  100 MG capsule Commonly known as: TESSALON Take 1 capsule (100 mg total) by mouth every 8 (eight) hours.   calcitRIOL 0.25 MCG capsule Commonly known as: ROCALTROL Take 1 capsule (0.25 mcg total) by mouth every Tuesday, Thursday, and Saturday at 6 PM.   calcium acetate 667 MG capsule Commonly known as: PHOSLO Take 3 capsules (2,001 mg total) by mouth 3 (three) times daily.   carvedilol 25 MG tablet Commonly known as: COREG Take 1 tablet (25 mg total) by mouth 2 (two) times daily with a meal.   HumaLOG KwikPen 100 UNIT/ML KwikPen Generic drug: insulin lispro Inject into the skin.   insulin glargine-yfgn 100 UNIT/ML injection Commonly known as: SEMGLEE Inject 0.07 mLs (7 Units total) into the skin at bedtime.   isosorbide mononitrate 60  MG 24 hr tablet Commonly known as: IMDUR Take 1 tablet (60 mg total) by mouth daily.   levofloxacin 500 MG tablet Commonly known as: LEVAQUIN Take 1 tablet (500 mg total) by mouth every other day for 1 dose. (Take on July 6th). Start taking on: November 23, 2022   melatonin 3 MG Tabs tablet Take 1 tablet (3 mg total) by mouth at bedtime as needed (insomnia).   NovoLOG FlexPen 100 UNIT/ML FlexPen Generic drug: insulin aspart 0-9 Units, Subcutaneous, 3 times daily with meals CBG < 70: Implement Hypoglycemia measures CBG 70 - 120: 0 units CBG 121 - 150: 1 unit CBG 151 - 200: 2 units CBG 201 - 250: 3 units CBG 251 - 300: 5 units CBG 301 - 350: 7 units CBG 351 - 400: 9 units CBG > 400: call MD   oxyCODONE-acetaminophen 5-325 MG tablet Commonly known as: PERCOCET/ROXICET Take 1 tablet by mouth every 12 (twelve) hours as needed for severe pain.   Protonix 40 MG tablet Generic drug: pantoprazole Take 1 tablet (40 mg total) by mouth daily.   simethicone 80 MG chewable tablet Commonly known as: MYLICON Chew 1 tablet (80 mg total) by mouth 4 (four) times daily for 5 days.   TechLite Pen Needles 32G X 4 MM Misc Generic drug: Insulin Pen Needle Use with insulin pens   vancomycin  IVPB Inject 750 mg into the vein Every Tuesday,Thursday,and Saturday with dialysis. Indication:  MRSA bacteremia First Dose: Yes Last Day of Therapy:  11/27/22 Labs - Once weekly: ESR and CRP, CBC, BMP, vancomycin trough Method of administration: Per hemodialysis protocol at the HD center Method of administration may be changed at the discretion of the patient and/or caregiver's ability to self-administer the medication ordered.       Allergies  Allergen Reactions   Sulfa Antibiotics Rash    Severe rash.  Severe rash.     Severe rash.   Other Other (See Comments)    Patient states he is allergic to the TB test - unknown reaction during childhood    Bactrim [Sulfamethoxazole-Trimethoprim] Rash   Cephalexin  Other (See Comments) and Rash    Patient with severe drug reaction including exfoliating skin rash and hypotension after being prescribed both cephalexin and sulfamethoxazole-trimethoprim simultaneously. Favor SMX as much more likely culprit as patient had tolerated other cephalosporins in the past, but cannot say with complete certainty that this was not related to cephalexin.  Other Reaction(s): Other, Other (See Comments)  Patient with severe drug reaction including exfoliating skin rash and hypotension after being prescribed both cephalexin and sulfamethoxazole-trimethoprim simultaneously. Favor SMX as much more likely culprit as patient had tolerated other cephalosporins in the past, but cannot say  with complete certainty that this was not related to cephalexin.   "Patient with severe drug reaction including exfoliating skin rash and hypotension after being prescribed both cephalexin and sulfamethoxazole-trimethoprim simultaneously. Favor SMX as much more likely culprit as patient had tolerated other cephalosporins in the past, but cannot say with complete certainty that this was not related to cephalexin." As noted in [facility]   "Patient with severe drug reaction including exfoliating skin rash and hypotension after being prescribed both cephalexin and sulfamethoxazole-trimethoprim simultaneously. Favor SMX as much more likely culprit as patient had tolerated other cephalosporins in the past, but cannot say with complete certainty that this was not related to cephalexin." As noted in [facility]   "Patient with severe drug reaction including exfoliating skin rash and hypotension after being prescribed both cephalexin and sulfamethoxazole-trimethoprim simultaneously. Favor SMX as much more likely culprit as patient had tolerated other cephalosporins in the past, but cannot say with complete certainty that this was not related to cephalexin." As noted in [facility]     Patient with severe drug  reaction including exfoliating skin rash and hypotension after being prescribed both cephalexin and sulfamethoxazole-trimethoprim simultaneously. Favor SMX as much more likely culprit as patient had tolerated other cephalosporins in the past, but cannot say with complete certainty that this was not related to cephalexin.  "Patient with severe drug reaction including exfoliating skin rash and hypotension after being prescribed both cephalexin and sulfamethoxazole-trimethoprim simultaneously. Favor SMX as much more likely culprit as patient had tolerated other cephalosporins in the past, but cannot say with complete certainty that this was not related to cephalexin." As noted in [facility]  "Patient with severe drug reaction including exfoliating skin rash and hypotension after being prescribed both cephalexin and sulfamethoxazole-trimethoprim simultaneously. Favor SMX as much more likely culprit as patient had tolerated other cephalosporins in the past, but cannot say with complete certainty that this was not related to cephalexin." As noted in [facility]     Patient with severe drug reaction inc... (TRUNCATED)    Follow-up Information     [facility]. Schedule an appointment as soon as possible for a visit in 1 week(s).   Why: Follow-up in 1 to 2 weeks.        HD Follow up on 11/23/2022.   Why: Follow-up in hemodialysis on regular scheduled day on Saturday, 11/23/2022.                 The results of significant diagnostics from this hospitalization (including imaging, microbiology, ancillary and laboratory) are listed below for reference.    Significant Diagnostic Studies: DG Chest Port 1 View  Result Date: 11/16/2022 CLINICAL DATA:  Shortness of breath EXAM: PORTABLE CHEST 1 VIEW COMPARISON:  11/12/2022 FINDINGS: Cardiac shadow remains enlarged. Central vascular congestion is again identified. Patchy airspace opacity is noted in the right upper lobe along the minor fissure as well as  in the medial aspect of the right lower lobe. Left lung remains clear. IMPRESSION: Changes consistent with multifocal infiltrate on the right. Changes of mild CHF. Electronically Signed   By: Alcide Clever M.D.   On: 11/16/2022 20:43   ECHO TEE  Result Date: 10/25/2022    TRANSESOPHOGEAL ECHO REPORT   Patient Name:   BRONSON VODICKA Date of Exam: 10/25/2022 Medical Rec #:  [id number]       Height:       67.0 in Accession #:    [id number]      Weight:  160.0 lb Date of Birth:  1970/09/09      BSA:          1.839 m Patient Age:    51 years        BP:           163/80 mmHg Patient Gender: M               HR:           80 bpm. Exam Location:  Inpatient Procedure: Transesophageal Echo, Cardiac Doppler and Color Doppler Indications:     Bacteremia  History:         Patient has prior history of Echocardiogram examinations, most                  recent 10/22/2022. Arrythmias:LBBB; Risk Factors:Diabetes and                  Hypertension.  Sonographer:     Darlys Gales Referring Phys:  [id number] Jonita Albee Diagnosing Phys: Chilton Si MD PROCEDURE: After discussion of the risks and benefits of a TEE, an informed consent was obtained from the patient. The transesophogeal probe was passed without difficulty through the esophogus of the patient. Sedation performed by different physician. The patient was monitored while under deep sedation. Anesthestetic sedation was provided intravenously by Anesthesiology: 130mg  of Propofol, 0mg  of Lidocaine. The patient's vital signs; including heart rate, blood pressure, and oxygen saturation; remained stable throughout the procedure. The patient developed no complications during the procedure.  IMPRESSIONS  1. Left ventricular ejection fraction, by estimation, is 20 to 25%. The left ventricle has severely decreased function. The left ventricle demonstrates global hypokinesis.  2. Right ventricular systolic function is moderately reduced. The right ventricular size is mildly  enlarged.  3. No left atrial/left atrial appendage thrombus was detected.  4. A small pericardial effusion is present. There is no evidence of cardiac tamponade.  5. The mitral valve is normal in structure. Trivial mitral valve regurgitation. No evidence of mitral stenosis.  6. Tricuspid valve regurgitation is mild to moderate.  7. The aortic valve is tricuspid. Aortic valve regurgitation is not visualized. No aortic stenosis is present.  8. The inferior vena cava is normal in size with greater than 50% respiratory variability, suggesting right atrial pressure of 3 mmHg. Conclusion(s)/Recommendation(s): No evidence of vegetation/infective endocarditis on this transesophageael echocardiogram. FINDINGS  Left Ventricle: Left ventricular ejection fraction, by estimation, is 20 to 25%. The left ventricle has severely decreased function. The left ventricle demonstrates global hypokinesis. The left ventricular internal cavity size was normal in size. There is no left ventricular hypertrophy. Right Ventricle: The right ventricular size is mildly enlarged. No increase in right ventricular wall thickness. Right ventricular systolic function is moderately reduced. Left Atrium: Left atrial size was normal in size. No left atrial/left atrial appendage thrombus was detected. Right Atrium: Right atrial size was normal in size. Pericardium: A small pericardial effusion is present. There is no evidence of cardiac tamponade. Mitral Valve: The mitral valve is normal in structure. Trivial mitral valve regurgitation. No evidence of mitral valve stenosis. Tricuspid Valve: The tricuspid valve is normal in structure. Tricuspid valve regurgitation is mild to moderate. No evidence of tricuspid stenosis. Aortic Valve: The aortic valve is tricuspid. Aortic valve regurgitation is not visualized. No aortic stenosis is present. Pulmonic Valve: The pulmonic valve was normal in structure. Pulmonic valve regurgitation is trivial. No evidence of  pulmonic stenosis. Aorta: The aortic root is normal in size  and structure. Venous: The inferior vena cava is normal in size with greater than 50% respiratory variability, suggesting right atrial pressure of 3 mmHg. IAS/Shunts: No atrial level shunt detected by color flow Doppler. Chilton Si MD Electronically signed by Chilton Si MD Signature Date/Time: 10/25/2022/1:37:12 PM    Final    EP STUDY  Result Date: 10/25/2022 See surgical note for result.  VAS US DUPLEX DIALYSIS ACCESS (AVF, AVG)  Result Date: 10/24/2022 DIALYSIS ACCESS Patient Name:  DERRICK MOCKLER  Date of Exam:   10/24/2022 Medical Rec #: [id number]        Accession #:    [id number] Date of Birth: Feb 11, 1971       Patient Gender: M Patient Age:   71 years Exam Location:  [facility] Procedure:      VAS US DUPLEX DIALYSIS ACCESS (AVF, AVG) Referring Phys: Heath Lark --------------------------------------------------------------------------------  Reason for Exam: Infection of AV graft for dialysis. Access Site: Right Upper Extremity. Access Type: Forearm loop AVG. History: 02/20/21 Rt AVG creation. Comparison Study: no prior Performing Technologist: Argentina Ponder RVS  Examination Guidelines: A complete evaluation includes B-mode imaging, spectral Doppler, color Doppler, and power Doppler as needed of all accessible portions of each vessel. Unilateral testing is considered an integral part of a complete examination. Limited examinations for reoccurring indications may be performed as noted.  Findings:   +--------------------+----------+-----------------+--------+ AVG                 PSV (cm/s)Flow Vol (mL/min)Describe +--------------------+----------+-----------------+--------+ Native artery inflow   420          1731                +--------------------+----------+-----------------+--------+ Arterial anastomosis   494                              +--------------------+----------+-----------------+--------+ Prox  graft             160                              +--------------------+----------+-----------------+--------+ Mid graft              191                              +--------------------+----------+-----------------+--------+ Distal graft           140                              +--------------------+----------+-----------------+--------+ Venous anastomosis     349                              +--------------------+----------+-----------------+--------+ Venous outflow         270                              +--------------------+----------+-----------------+--------+  Summary: Patent arteriovenous graft. *See table(s) above for measurements and observations.  Diagnosing physician: Heath Lark Electronically signed by Heath Lark on 10/24/2022 at 10:05:07 AM.    --------------------------------------------------------------------------------   Final     Microbiology: Recent Results (from the past 240 hour(s))  Culture, blood (routine x 2)     Status: None   Collection  Time: 11/16/22  8:50 PM   Specimen: BLOOD  Result Value Ref Range Status   Specimen Description BLOOD RIGHT ANTECUBITAL  Final   Special Requests   Final    BOTTLES DRAWN AEROBIC AND ANAEROBIC Blood Culture adequate volume   Culture   Final    NO GROWTH 5 DAYS Performed at [facility], [address], [place], [place] [address]    Report Status 11/21/2022 FINAL  Final  Culture, blood (routine x 2)     Status: None   Collection Time: 11/16/22  8:55 PM   Specimen: BLOOD  Result Value Ref Range Status   Specimen Description BLOOD SITE NOT SPECIFIED  Final   Special Requests   Final    BOTTLES DRAWN AEROBIC AND ANAEROBIC Blood Culture adequate volume   Culture   Final    NO GROWTH 5 DAYS Performed at [facility], [address], [place], [place] [address]    Report Status 11/21/2022 FINAL  Final  SARS Coronavirus 2 by RT PCR (hospital order, performed in [facility] hospital lab)  *cepheid single result test* Anterior Nasal Swab     Status: None   Collection Time: 11/17/22 11:23 AM   Specimen: Anterior Nasal Swab  Result Value Ref Range Status   SARS Coronavirus 2 by RT PCR NEGATIVE NEGATIVE Final    Comment: Performed at [facility], [address] [place], [place] [address]     Labs: Basic Metabolic Panel: Recent Labs  Lab 11/16/22 2129 11/17/22 0510 11/18/22 1240 11/20/22 1358 11/22/22 1400  NA 136 137 134* 134* 138  K 3.7 4.1 3.7 3.8 4.2  CL 94* 95* 96* 95* 97*  CO2 24 23 22 25 25   GLUCOSE 132* 112* 119* 114* 143*  BUN 52* 56* 62* 44* 38*  CREATININE 7.85* 8.22* 9.76* 8.66* 7.75*  CALCIUM 8.2* 8.4* 8.0* 7.9* 8.6*  MG  --  2.4  --   --   --   PHOS  --  5.0* 6.3* 5.0* 4.4   Liver Function Tests: Recent Labs  Lab 11/17/22 0510 11/18/22 1240 11/20/22 1358 11/22/22 1400  ALBUMIN 3.1* 2.9* 2.7* 2.9*   No results for input(s): "LIPASE", "AMYLASE" in the last 168 hours. No results for input(s): "AMMONIA" in the last 168 hours. CBC: Recent Labs  Lab 11/16/22 2129 11/18/22 1243 11/20/22 1358 11/22/22 1400  WBC 11.9* 5.7 5.3 6.2  NEUTROABS 9.2*  --   --   --   HGB 9.4* 10.1* 10.1* 10.2*  HCT 31.6* 33.4* 34.1* 34.9*  MCV 88.8 89.3 88.6 90.2  PLT 311 363 370 316   Cardiac Enzymes: No results for input(s): "CKTOTAL", "CKMB", "CKMBINDEX", "TROPONINI" in the last 168 hours. BNP: BNP (last 3 results) Recent Labs    05/25/22 0820 08/24/22 2151 09/08/22 0340  BNP 3,829.2* >4,500.0* >4,500.0*    ProBNP (last 3 results) No results for input(s): "PROBNP" in the last 8760 hours.  CBG: Recent Labs  Lab 11/17/22 1152 11/18/22 1713 11/20/22 0757 11/21/22 1149 11/22/22 0746  GLUCAP 137* 94 108* 173* 155*       Signed:  Ramiro Harvest MD.  [facility] 11/22/2022, 2:48 PM

## 2022-11-22 NOTE — Plan of Care (Addendum)
[facility] Patient Discharge Orders- [facility] CLINIC: [facility]  Patient's name: [name] Admit/DC Dates: 11/16/2022 - 11/22/2022  Discharge Diagnoses: R sided pneumonia   Acute/chronic resp failure  2/2 PNA + volume overload   Aranesp: Given: --   Date and amount of last dose: --  Last Hgb: 10.2 PRBC's Given: -- Date/# of units: -- ESA dose for discharge: mircera 200 mcg IV q 2 weeks  IV Iron dose at discharge: --  Heparin change: --  EDW Change: -- New EDW:  No change   Bath Change: --  Access intervention/Change: -- Details:  Hectorol/Calcitriol change: --  Discharge Labs: Calcium  8.6 Phosphorus 4.4   Albumin 2.9    K+ 4.2  IV Antibiotics: Yes Details: Continue Vancomycin 750mg  IV q HD until 11/27/22   On Coumadin?: -- Last INR: Next INR: Managed By:   OTHER/APPTS/LAB ORDERS:    D/C Meds to be reconciled by nurse after every discharge.  Completed By: Tomasa Blase PA-C  Reviewed by: MD:______ RN_______

## 2022-11-22 NOTE — Progress Notes (Signed)
   11/22/22 1525  Vitals  Temp 97.7 F (36.5 C)  Pulse Rate 71  Resp 18  BP (!) 175/77  SpO2 99 %  O2 Device Room Air  Type of Weight Post-Dialysis  Oxygen Therapy  Patient Activity (if Appropriate) In bed  Pulse Oximetry Type Continuous  Oximetry Probe Site Changed No  Post Treatment  Dialyzer Clearance Lightly streaked  Duration of HD Treatment -hour(s) 1 hour(s)  Hemodialysis Intake (mL) 0 mL  Liters Processed 26.8  Fluid Removed (mL) 200 mL  Tolerated HD Treatment No (Comment)  Post-Hemodialysis Comments see progress notes  AVG/AVF Arterial Site Held (minutes) 10 minutes  AVG/AVF Venous Site Held (minutes) 10 minutes   Received patient in bed to unit.  Alert and oriented.  Informed consent signed and in chart.   TX duration:57 minutes   Transported back to the room  .  Hand-off given to patient's nurse.   Access used: R arm graft Access issues: no complications  Total UF removed: Medication(s) given: none Dr.Lin notified of pts refusal to cooperate with needle placement---arm location--pt refusing to wear an oxygen probe---pt refusing to not cover up his head access arm with a blanket---pt refuses to cooperate or even talk--  [facility]

## 2022-11-22 NOTE — Progress Notes (Signed)
Contacted nephrology PA, Lucky Cowboy in regard to vancomycin dose as patient tolerated less than one hour of dialysis today. Ejigiri states not to give vancomycin today since patient was dialyzed for such a short period of time. States to encourage patient to go to dialysis tomorrow so he can receive his antibiotic.

## 2022-11-22 NOTE — TOC Transition Note (Signed)
Transition of Care [facility]) - CM/SW Discharge Note   Patient Details  Name: [name] MRN: [id number] Date of Birth: 1970-11-11  Transition of Care [facility]) CM/SW Contact:  Ralene Bathe, LCSW Phone Number: 11/22/2022, 5:16 PM   Clinical Narrative:    Per MD patient ready for DC. The patient has been given shelter resources and informed of the [employer] where he can go to sleep and receive other assistance.  Patient was already aware of this resource.  LCSW contacted the renal navigator and was informed that the patient can resume at his dialysis clinic tomorrow.  Patient provided with bus pass.  CSW will sign off for now as social work intervention is no longer needed. Please consult [place] again if new needs arise.    Final next level of care: Homeless Shelter     Patient Goals and CMS Choice      Discharge Placement                  Patient to be transferred to facility by: Public Transportation      Discharge Plan and Services Additional resources added to the After Visit Summary for   In-house Referral: Clinical Social Work                                   Social Determinants of Health (SDOH) Interventions SDOH Screenings   Food Insecurity: Food Insecurity Present (05/08/2022)  Housing: High Risk (10/21/2022)  Transportation Needs: Unmet Transportation Needs (10/21/2022)  Utilities: Not At Risk (10/21/2022)  Depression (PHQ2-9): Low Risk  (10/13/2019)  Financial Resource Strain: High Risk (05/08/2022)  Tobacco Use: Low Risk  (11/16/2022)     Readmission Risk Interventions    10/22/2022    2:17 PM 11/07/2021    4:21 PM 02/19/2021    3:52 PM  Readmission Risk Prevention Plan  Transportation Screening Complete Complete Complete  Medication Review Oceanographer) Complete Complete Complete  PCP or Specialist appointment within 3-5 days of discharge Complete Complete Complete  HRI or Home Care Consult Complete Complete Complete  SW  Recovery Care/Counseling Consult Complete Complete Complete  Palliative Care Screening Not Applicable Not Applicable Not Applicable  Skilled Nursing Facility Complete Not Applicable Not Applicable

## 2022-11-23 ENCOUNTER — Other Ambulatory Visit: Payer: Self-pay

## 2022-11-23 ENCOUNTER — Emergency Department (HOSPITAL_COMMUNITY)
Admission: EM | Admit: 2022-11-23 | Discharge: 2022-11-23 | Disposition: A | Discharge status: Home / Self Care | Payer: 59 | Attending: Emergency Medicine | Admitting: Emergency Medicine

## 2022-11-23 ENCOUNTER — Emergency Department (HOSPITAL_COMMUNITY): Payer: 59

## 2022-11-23 ENCOUNTER — Telehealth: Payer: Self-pay | Admitting: Nephrology

## 2022-11-23 DIAGNOSIS — R0989 Other specified symptoms and signs involving the circulatory and respiratory systems: Secondary | ICD-10-CM | POA: Diagnosis not present

## 2022-11-23 DIAGNOSIS — Z794 Long term (current) use of insulin: Secondary | ICD-10-CM | POA: Insufficient documentation

## 2022-11-23 DIAGNOSIS — R0602 Shortness of breath: Secondary | ICD-10-CM | POA: Insufficient documentation

## 2022-11-23 DIAGNOSIS — I517 Cardiomegaly: Secondary | ICD-10-CM | POA: Diagnosis not present

## 2022-11-23 DIAGNOSIS — Z7982 Long term (current) use of aspirin: Secondary | ICD-10-CM | POA: Insufficient documentation

## 2022-11-23 LAB — CBC WITH DIFFERENTIAL/PLATELET
Abs Immature Granulocytes: 0.03 10*3/uL (ref 0.00–0.07)
Basophils Absolute: 0 10*3/uL (ref 0.0–0.1)
Basophils Relative: 1 %
Eosinophils Absolute: 0.5 10*3/uL (ref 0.0–0.5)
Eosinophils Relative: 8 %
HCT: 37.2 % — ABNORMAL LOW (ref 39.0–52.0)
Hemoglobin: 10.7 g/dL — ABNORMAL LOW (ref 13.0–17.0)
Immature Granulocytes: 1 %
Lymphocytes Relative: 16 %
Lymphs Abs: 1 10*3/uL (ref 0.7–4.0)
MCH: 25.9 pg — ABNORMAL LOW (ref 26.0–34.0)
MCHC: 28.8 g/dL — ABNORMAL LOW (ref 30.0–36.0)
MCV: 90.1 fL (ref 80.0–100.0)
Monocytes Absolute: 0.7 10*3/uL (ref 0.1–1.0)
Monocytes Relative: 11 %
Neutro Abs: 4.2 10*3/uL (ref 1.7–7.7)
Neutrophils Relative %: 63 %
Platelets: 340 10*3/uL (ref 150–400)
RBC: 4.13 MIL/uL — ABNORMAL LOW (ref 4.22–5.81)
RDW: 16.5 % — ABNORMAL HIGH (ref 11.5–15.5)
WBC: 6.5 10*3/uL (ref 4.0–10.5)
nRBC: 0 % (ref 0.0–0.2)

## 2022-11-23 LAB — COMPREHENSIVE METABOLIC PANEL
ALT: 15 U/L (ref 0–44)
AST: 21 U/L (ref 15–41)
Albumin: 3.2 g/dL — ABNORMAL LOW (ref 3.5–5.0)
Alkaline Phosphatase: 186 U/L — ABNORMAL HIGH (ref 38–126)
Anion gap: 15 (ref 5–15)
BUN: 33 mg/dL — ABNORMAL HIGH (ref 6–20)
CO2: 25 mmol/L (ref 22–32)
Calcium: 8.7 mg/dL — ABNORMAL LOW (ref 8.9–10.3)
Chloride: 99 mmol/L (ref 98–111)
Creatinine, Ser: 7.36 mg/dL — ABNORMAL HIGH (ref 0.61–1.24)
GFR, Estimated: 8 mL/min — ABNORMAL LOW (ref >60)
Glucose, Bld: 146 mg/dL — ABNORMAL HIGH (ref 70–99)
Potassium: 4.1 mmol/L (ref 3.5–5.1)
Sodium: 139 mmol/L (ref 135–145)
Total Bilirubin: 1 mg/dL (ref 0.3–1.2)
Total Protein: 7.7 g/dL (ref 6.5–8.1)

## 2022-11-23 NOTE — Discharge Instructions (Signed)
Please follow-up with your dialysis center.  I would call them tomorrow and let them know that you are having trouble breathing and see if they can fit you into their schedule.  Please return for worsening difficulty breathing.

## 2022-11-23 NOTE — ED Provider Notes (Signed)
[place] EMERGENCY DEPARTMENT AT [facility] Provider Note   CSN: [id number] Arrival date & time: 11/23/22  0032     History  Chief Complaint  Patient presents with   Shortness of Breath    [name] is a 52 y.o. male.  52 yo M with a chief complaints of shortness of breath.  He tells me this been going on for some time.  He has trouble quantifying how long it has been bothering him.  He was just in the hospital for pneumonia.  He is not sure if its gotten better or worse.  He thinks he still coughs a bit.  He had dialysis done yesterday but said he left after an hour.  He is a bit vague about why he only got an hour session.   Shortness of Breath      Home Medications Prior to Admission medications   Medication Sig Start Date End Date Taking? Authorizing Provider  acetaminophen (TYLENOL) 500 MG tablet Take 1 tablet (500 mg total) by mouth every 8 (eight) hours as needed for mild pain. 05/28/22   Leroy Sea, MD  ASPIRIN LOW DOSE 81 MG tablet Take 1 tablet (81 mg total) by mouth daily. 05/28/22   Leroy Sea, MD  benzonatate (TESSALON) 100 MG capsule Take 1 capsule (100 mg total) by mouth every 8 (eight) hours. 09/09/22   Sabas Sous, MD  calcitRIOL (ROCALTROL) 0.25 MCG capsule Take 1 capsule (0.25 mcg total) by mouth every Tuesday, Thursday, and Saturday at 6 PM. 10/26/22   Ghimire, Werner Lean, MD  calcium acetate (PHOSLO) 667 MG capsule Take 3 capsules (2,001 mg total) by mouth 3 (three) times daily. 05/28/22   Leroy Sea, MD  carvedilol (COREG) 25 MG tablet Take 1 tablet (25 mg total) by mouth 2 (two) times daily with a meal. 10/26/22   Ghimire, Werner Lean, MD  glucose blood test strip Use to check fasting blood sugar once daily. 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 10/26/22   [provider]  insulin aspart (NOVOLOG FLEXPEN) 100 UNIT/ML FlexPen 0-9  Units, Subcutaneous, 3 times daily with meals CBG < 70: Implement Hypoglycemia measures CBG 70 - 120: 0 units CBG 121 - 150: 1 unit CBG 151 - 200: 2 units CBG 201 - 250: 3 units CBG 251 - 300: 5 units CBG 301 - 350: 7 units CBG 351 - 400: 9 units CBG > 400: call MD 10/26/22   Maretta Bees, MD  insulin glargine-yfgn (SEMGLEE) 100 UNIT/ML injection Inject 0.07 mLs (7 Units total) into the skin at bedtime. 10/26/22   Ghimire, Werner Lean, MD  Insulin Pen Needle 32G X 4 MM MISC Use with insulin pens 05/28/22   Leroy Sea, MD  isosorbide mononitrate (IMDUR) 60 MG 24 hr tablet Take 1 tablet (60 mg total) by mouth daily. 10/26/22   Ghimire, Werner Lean, MD  levofloxacin (LEVAQUIN) 500 MG tablet Take 1 tablet (500 mg total) by mouth every other day for 1 dose. (Take on July 6th). 11/23/22 11/24/22  Rodolph Bong, MD  melatonin 3 MG TABS tablet Take 1 tablet (3 mg total) by mouth at bedtime as needed (insomnia). 10/26/22   Ghimire, Werner Lean, MD  oxyCODONE-acetaminophen (PERCOCET/ROXICET) 5-325 MG tablet Take 1 tablet by mouth every 12 (twelve) hours as needed for severe pain. 10/26/22   Ghimire, Werner Lean, MD  PROTONIX 40 MG tablet Take 1 tablet (40 mg total) by mouth daily. 05/25/22   Leroy Sea, MD  simethicone (MYLICON) 80 MG chewable tablet Chew 1 tablet (80 mg total) by mouth 4 (four) times daily for 5 days. 11/22/22 12/17/22  Rodolph Bong, MD  vancomycin IVPB Inject 750 mg into the vein Every Tuesday,Thursday,and Saturday with dialysis. Indication:  MRSA bacteremia First Dose: Yes Last Day of Therapy:  11/27/22 Labs - Once weekly: ESR and CRP, CBC, BMP, vancomycin trough Method of administration: Per hemodialysis protocol at the HD center Method of administration may be changed at the discretion of the patient and/or caregiver's ability to self-administer the medication ordered. 10/26/22 11/27/22  Randall Hiss, MD  gabapentin (NEURONTIN) 300 MG capsule Take 1 capsule (300 mg total) by mouth 3  (three) times daily. Patient not taking: Reported on 07/27/2019 07/29/18 08/30/19  Kallie Locks, FNP  sildenafil (VIAGRA) 25 MG tablet Take 1 tablet (25 mg total) by mouth as needed for erectile dysfunction. for erectile dysfunction. Patient not taking: Reported on 08/11/2020 10/13/19 11/27/20  Kallie Locks, FNP      Allergies    Sulfa antibiotics, Other, Bactrim [sulfamethoxazole-trimethoprim], and Cephalexin    Review of Systems   Review of Systems  Respiratory:  Positive for shortness of breath.     Physical Exam Updated Vital Signs BP (!) 167/85 (BP Location: Left Arm)   Pulse 80   Temp 98.2 F (36.8 C) (Oral)   Resp 18   SpO2 98%  Physical Exam Vitals and nursing note reviewed.  Constitutional:      Appearance: He is well-developed.  HENT:     Head: Normocephalic and atraumatic.  Eyes:     Pupils: Pupils are equal, round, and reactive to light.  Neck:     Vascular: JVD (to mid neck) present.  Cardiovascular:     Rate and Rhythm: Normal rate and regular rhythm.     Heart sounds: No murmur heard.    No friction rub. No gallop.  Pulmonary:     Effort: No respiratory distress.     Breath sounds: No wheezing.  Abdominal:     General: There is no distension.     Tenderness: There is no abdominal tenderness. There is no guarding or rebound.  Musculoskeletal:        General: Normal range of motion.     Cervical back: Normal range of motion and neck supple.  Skin:    Coloration: Skin is not pale.     Findings: No rash.  Neurological:     Mental Status: He is alert and oriented to person, place, and time.  Psychiatric:        Behavior: Behavior normal.     ED Results / Procedures / Treatments   Labs (all labs ordered are listed, but only abnormal results are displayed) Labs Reviewed  CBC WITH DIFFERENTIAL/PLATELET - Abnormal; Notable for the following components:      Result Value   RBC 4.13 (*)    Hemoglobin 10.7 (*)    HCT 37.2 (*)    MCH 25.9 (*)     MCHC 28.8 (*)    RDW 16.5 (*)    All other components within normal limits  COMPREHENSIVE METABOLIC PANEL - Abnormal; Notable for the following components:   Glucose, Bld 146 (*)    BUN 33 (*)    Creatinine, Ser 7.36 (*)    Calcium 8.7 (*)    Albumin 3.2 (*)  Alkaline Phosphatase 186 (*)    GFR, Estimated 8 (*)    All other components within normal limits    EKG None  Radiology DG Chest 2 View  Result Date: 11/23/2022 CLINICAL DATA:  Shortness of breath EXAM: CHEST - 2 VIEW COMPARISON:  Chest x-ray 11/16/2022 FINDINGS: The heart is enlarged. There central pulmonary vascular congestion. There is no focal lung infiltrate, pleural effusion or pneumothorax. No acute fractures are seen. IMPRESSION: Cardiomegaly with central pulmonary vascular congestion. Electronically Signed   By: [name] M.D.   On: 11/23/2022 01:21    Procedures Procedures    Medications Ordered in ED Medications - No data to display  ED Course/ Medical Decision Making/ A&P                             Medical Decision Making Amount and/or Complexity of Data Reviewed Labs: ordered. Radiology: ordered.   52 yo M with a chief complaints of difficulty breathing.  He has trouble giving me much more history than that.  He has no tachypnea has clear lung sounds bilaterally.  Appears to be mildly fluid overloaded.  Not requiring oxygen.  Chest x-ray independently interpreted by me without significant fluid overload.  No obvious focal infiltrate.  Lab work without hyperkalemia, no significant metabolic acidosis, no significant uremia.  Feel safe for discharge at this time.  I encouraged him to contact his dialysis center tomorrow perhaps schedule a more urgent dialysis session.  2:15 AM:  I have discussed the diagnosis/risks/treatment options with the patient.  Evaluation and diagnostic testing in the emergency department does not suggest an emergent condition requiring admission or immediate intervention beyond  what has been performed at this time.  They will follow up with PCP. We also discussed returning to the ED immediately if new or worsening sx occur. We discussed the sx which are most concerning (e.g., sudden worsening pain, fever, inability to tolerate by mouth) that necessitate immediate return. Medications administered to the patient during their visit and any new prescriptions provided to the patient are listed below.  Medications given during this visit Medications - No data to display   The patient appears reasonably screen and/or stabilized for discharge and I doubt any other medical condition or other [facility] requiring further screening, evaluation, or treatment in the ED at this time prior to discharge.          Final Clinical Impression(s) / ED Diagnoses Final diagnoses:  Shortness of breath    Rx / DC Orders ED Discharge Orders     None         Melene Plan, DO 11/23/22 0215

## 2022-11-23 NOTE — ED Triage Notes (Signed)
Patient reports SOB today with dry cough.

## 2022-11-23 NOTE — Telephone Encounter (Signed)
Transition of care contact from inpatient facility  Date of Discharge: 11/22/22 Date of Contact: 11/23/22 Method of contact: Phone  Attempted to contact patient to discuss transition of care from inpatient admission. Patient did not answer the phone. Noted that he was in the ED this am with SOB. He was due for routine dialysis today. I called his outpatient center --he did not show up for dialysis today.

## 2022-11-25 ENCOUNTER — Other Ambulatory Visit: Payer: Self-pay

## 2022-11-25 ENCOUNTER — Emergency Department (HOSPITAL_COMMUNITY): Payer: 59

## 2022-11-25 ENCOUNTER — Encounter (HOSPITAL_COMMUNITY): Payer: Self-pay

## 2022-11-25 ENCOUNTER — Emergency Department (HOSPITAL_COMMUNITY)
Admission: EM | Admit: 2022-11-25 | Discharge: 2022-11-26 | Disposition: A | Discharge status: Home / Self Care | Payer: 59 | Attending: Emergency Medicine | Admitting: Emergency Medicine

## 2022-11-25 DIAGNOSIS — Z992 Dependence on renal dialysis: Secondary | ICD-10-CM | POA: Insufficient documentation

## 2022-11-25 DIAGNOSIS — N186 End stage renal disease: Secondary | ICD-10-CM | POA: Insufficient documentation

## 2022-11-25 DIAGNOSIS — I517 Cardiomegaly: Secondary | ICD-10-CM | POA: Diagnosis not present

## 2022-11-25 DIAGNOSIS — R0602 Shortness of breath: Secondary | ICD-10-CM | POA: Diagnosis not present

## 2022-11-25 DIAGNOSIS — I10 Essential (primary) hypertension: Secondary | ICD-10-CM | POA: Diagnosis not present

## 2022-11-25 DIAGNOSIS — I120 Hypertensive chronic kidney disease with stage 5 chronic kidney disease or end stage renal disease: Secondary | ICD-10-CM | POA: Insufficient documentation

## 2022-11-25 DIAGNOSIS — Z7982 Long term (current) use of aspirin: Secondary | ICD-10-CM | POA: Diagnosis not present

## 2022-11-25 DIAGNOSIS — R0902 Hypoxemia: Secondary | ICD-10-CM | POA: Diagnosis not present

## 2022-11-25 DIAGNOSIS — R0989 Other specified symptoms and signs involving the circulatory and respiratory systems: Secondary | ICD-10-CM | POA: Diagnosis not present

## 2022-11-25 DIAGNOSIS — Z794 Long term (current) use of insulin: Secondary | ICD-10-CM | POA: Diagnosis not present

## 2022-11-25 LAB — CBC
HCT: 37.1 % — ABNORMAL LOW (ref 39.0–52.0)
Hemoglobin: 11.1 g/dL — ABNORMAL LOW (ref 13.0–17.0)
MCH: 26.7 pg (ref 26.0–34.0)
MCHC: 29.9 g/dL — ABNORMAL LOW (ref 30.0–36.0)
MCV: 89.4 fL (ref 80.0–100.0)
Platelets: 397 10*3/uL (ref 150–400)
RBC: 4.15 MIL/uL — ABNORMAL LOW (ref 4.22–5.81)
RDW: 16.5 % — ABNORMAL HIGH (ref 11.5–15.5)
WBC: 5.8 10*3/uL (ref 4.0–10.5)
nRBC: 0 % (ref 0.0–0.2)

## 2022-11-25 LAB — BASIC METABOLIC PANEL
Anion gap: 19 — ABNORMAL HIGH (ref 5–15)
BUN: 60 mg/dL — ABNORMAL HIGH (ref 6–20)
CO2: 24 mmol/L (ref 22–32)
Calcium: 8.7 mg/dL — ABNORMAL LOW (ref 8.9–10.3)
Chloride: 97 mmol/L — ABNORMAL LOW (ref 98–111)
Creatinine, Ser: 10.08 mg/dL — ABNORMAL HIGH (ref 0.61–1.24)
GFR, Estimated: 6 mL/min — ABNORMAL LOW (ref >60)
Glucose, Bld: 172 mg/dL — ABNORMAL HIGH (ref 70–99)
Potassium: 4.5 mmol/L (ref 3.5–5.1)
Sodium: 140 mmol/L (ref 135–145)

## 2022-11-25 LAB — TROPONIN I (HIGH SENSITIVITY): Troponin I (High Sensitivity): 129 ng/L (ref <18)

## 2022-11-25 NOTE — ED Triage Notes (Signed)
PT BIB [facility] for [facility] after not completing dialysis Friday and missing dialysis Saturday. EKG changes.  140/90 112 HR 157 CBG

## 2022-11-25 NOTE — ED Notes (Addendum)
Date and time results received: 11/25/22 2226   Test: Troponin Critical Value: 129  Name of Provider Notified: Dr. Rodena Medin

## 2022-11-25 NOTE — Discharge Instructions (Addendum)
Please go to the dialysis center tomorrow for your dialysis session as scheduled. Please do not hesitate to return to emergency department if worrisome signs symptoms we discussed become apparent.

## 2022-11-25 NOTE — ED Provider Notes (Signed)
[place] EMERGENCY DEPARTMENT AT [name] Provider Note   CSN: [id number] Arrival date & time: 11/25/22  2058     History  Chief Complaint  Patient presents with   Shortness of Breath    [name] is a 52 y.o. male history of ESRD on dialysis Tuesday Thursday Saturday, hypertension presented today for evaluation of shortness of breath.  States he has had shortness of breath over the last week.  Patient had a dialysis session on Wednesday and Fridays.  He states he missed his Saturday session.  Patient states since dialysis he was told to wear oxygen if has shortness of breath.  He endorses cough.  Denies fever, nausea, vomiting.  He denies any chest pain.  Patient is currently homeless.  He was last seen on 11/23/2022 for similar symptoms.   Shortness of Breath      Home Medications Prior to Admission medications   Medication Sig Start Date End Date Taking? Authorizing Provider  acetaminophen (TYLENOL) 500 MG tablet Take 1 tablet (500 mg total) by mouth every 8 (eight) hours as needed for mild pain. 05/28/22   Leroy Sea, MD  ASPIRIN LOW DOSE 81 MG tablet Take 1 tablet (81 mg total) by mouth daily. 05/28/22   Leroy Sea, MD  benzonatate (TESSALON) 100 MG capsule Take 1 capsule (100 mg total) by mouth every 8 (eight) hours. 09/09/22   Sabas Sous, MD  calcitRIOL (ROCALTROL) 0.25 MCG capsule Take 1 capsule (0.25 mcg total) by mouth every Tuesday, Thursday, and Saturday at 6 PM. 10/26/22   Ghimire, Werner Lean, MD  calcium acetate (PHOSLO) 667 MG capsule Take 3 capsules (2,001 mg total) by mouth 3 (three) times daily. 05/28/22   Leroy Sea, MD  carvedilol (COREG) 25 MG tablet Take 1 tablet (25 mg total) by mouth 2 (two) times daily with a meal. 10/26/22   Ghimire, Werner Lean, MD  glucose blood test strip Use to check fasting blood sugar once daily. 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 10/26/22   [provider]  insulin aspart (NOVOLOG FLEXPEN) 100 UNIT/ML FlexPen 0-9 Units, Subcutaneous, 3 times daily with meals CBG < 70: Implement Hypoglycemia measures CBG 70 - 120: 0 units CBG 121 - 150: 1 unit CBG 151 - 200: 2 units CBG 201 - 250: 3 units CBG 251 - 300: 5 units CBG 301 - 350: 7 units CBG 351 - 400: 9 units CBG > 400: call MD 10/26/22   Maretta Bees, MD  insulin glargine-yfgn (SEMGLEE) 100 UNIT/ML injection Inject 0.07 mLs (7 Units total) into the skin at bedtime. 10/26/22   Ghimire, Werner Lean, MD  Insulin Pen Needle 32G X 4 MM MISC Use with insulin pens 05/28/22   Leroy Sea, MD  isosorbide mononitrate (IMDUR) 60 MG 24 hr tablet Take 1 tablet (60 mg total) by mouth daily. 10/26/22   Ghimire, Werner Lean, MD  melatonin 3 MG TABS tablet Take 1 tablet (3 mg total) by mouth at bedtime as needed (insomnia). 10/26/22   Ghimire, Werner Lean, MD  oxyCODONE-acetaminophen (PERCOCET/ROXICET) 5-325 MG tablet Take 1 tablet by mouth every 12 (twelve) hours as needed for severe pain. 10/26/22   Ghimire, Werner Lean, MD  PROTONIX 40 MG tablet Take 1 tablet (40 mg total) by mouth daily. 05/25/22   Leroy Sea, MD  simethicone (MYLICON) 80 MG chewable tablet Dorna Bloom  1 tablet (80 mg total) by mouth 4 (four) times daily for 5 days. 11/22/22 12/17/22  Rodolph Bong, MD  vancomycin IVPB Inject 750 mg into the vein Every Tuesday,Thursday,and Saturday with dialysis. Indication:  MRSA bacteremia First Dose: Yes Last Day of Therapy:  11/27/22 Labs - Once weekly: ESR and CRP, CBC, BMP, vancomycin trough Method of administration: Per hemodialysis protocol at the HD center Method of administration may be changed at the discretion of the patient and/or caregiver's ability to self-administer the medication ordered. 10/26/22 11/27/22  Randall Hiss, MD  gabapentin (NEURONTIN) 300 MG capsule Take 1 capsule (300 mg total) by mouth 3 (three) times daily. Patient not taking:  Reported on 07/27/2019 07/29/18 08/30/19  Kallie Locks, FNP  sildenafil (VIAGRA) 25 MG tablet Take 1 tablet (25 mg total) by mouth as needed for erectile dysfunction. for erectile dysfunction. Patient not taking: Reported on 08/11/2020 10/13/19 11/27/20  Kallie Locks, FNP      Allergies    Sulfa antibiotics, Other, Bactrim [sulfamethoxazole-trimethoprim], and Cephalexin    Review of Systems   Review of Systems  Respiratory:  Positive for shortness of breath.     Physical Exam Updated Vital Signs BP (!) 157/74   Pulse 95   Temp 98.5 F (36.9 C) (Oral)   Resp 19   Ht 5\' 7"  (1.702 m)   Wt 65.8 kg   SpO2 95%   BMI 22.71 kg/m  Physical Exam Vitals and nursing note reviewed.  Constitutional:      Appearance: Normal appearance.  HENT:     Head: Normocephalic and atraumatic.     Mouth/Throat:     Mouth: Mucous membranes are moist.  Eyes:     General: No scleral icterus. Cardiovascular:     Rate and Rhythm: Normal rate and regular rhythm.     Pulses: Normal pulses.     Heart sounds: Normal heart sounds.  Pulmonary:     Effort: Pulmonary effort is normal.     Breath sounds: Normal breath sounds.     Comments: Patient is placed on 2 L oxygen through nasal cannula, O2Sat at 95%. Abdominal:     General: Abdomen is flat.     Palpations: Abdomen is soft.     Tenderness: There is no abdominal tenderness.  Musculoskeletal:        General: No deformity.  Skin:    General: Skin is warm.     Findings: No rash.  Neurological:     General: No focal deficit present.     Mental Status: He is alert.  Psychiatric:        Mood and Affect: Mood normal.     ED Results / Procedures / Treatments   Labs (all labs ordered are listed, but only abnormal results are displayed) Labs Reviewed  CBC - Abnormal; Notable for the following components:      Result Value   RBC 4.15 (*)    Hemoglobin 11.1 (*)    HCT 37.1 (*)    MCHC 29.9 (*)    RDW 16.5 (*)    All other components within  normal limits  BASIC METABOLIC PANEL  TROPONIN I (HIGH SENSITIVITY)    EKG None  Radiology DG Chest 2 View  Result Date: 11/25/2022 CLINICAL DATA:  Shortness of breath. EXAM: CHEST - 2 VIEW COMPARISON:  Chest radiograph dated 11/23/2022. FINDINGS: There is cardiomegaly with vascular congestion. No focal consolidation, pleural effusion or pneumothorax. No acute osseous pathology. IMPRESSION: Cardiomegaly with vascular  congestion. Electronically Signed   By: Elgie Collard M.D.   On: 11/25/2022 21:57    Procedures Procedures    Medications Ordered in ED Medications - No data to display  ED Course/ Medical Decision Making/ A&P                             Medical Decision Making Amount and/or Complexity of Data Reviewed Labs: ordered. Radiology: ordered.   This patient presents to the ED for shortness of breath, this involves an extensive number of treatment options, and is a complaint that carries with a high risk of complications and morbidity.  The differential diagnosis includes CHF/ACS, COPD asthma, pneumonia, anaphylaxis, PE, pneumothorax, anxiety, COVID-19.  This is not an exhaustive list.  Lab tests: I ordered and personally interpreted labs.  The pertinent results include: WBC unremarkable. Hbg unremarkable. Platelets unremarkable. Electrolytes unremarkable. BUN, creatinine unremarkable.  Troponin 129.  Baseline is around 140-160.  Imaging studies: I ordered imaging studies, personally reviewed, interpreted imaging and agree with the radiologist's interpretations. The results include: Chest x-ray showed cardiomegaly with vascular congestion.  Problem list/ ED course/ Critical interventions/ Medical management: HPI: See above Vital signs within normal range and stable throughout visit. Laboratory/imaging studies significant for: See above. On physical examination, patient is afebrile and appears in no acute distress.  O2 sat 95% on 2 L of nasal cannula.  Patient states  he intermittently uses oxygen at home.  BMP with BUN of 60 (baseline around 30), creatinine 10.08 (baseline around 8).  Chest x-ray showed cardiomegaly with vascular congestion, no signs of pneumonia, pneumothorax.  First troponin 129.  Baseline is around 140-160.  Patient denies any chest pain at this point. EKG without ischemic changes. Unlikely ACS/MI. Will consult nephrology. I have reviewed the patient home medicines and have made adjustments as needed.  Cardiac monitoring/EKG: The patient was maintained on a cardiac monitor.  I personally reviewed and interpreted the cardiac monitor which showed an underlying rhythm of: sinus rhythm.  Additional history obtained: External records from outside source obtained and reviewed including: Chart review including previous notes, labs, imaging.  Consultations obtained: I requested consultation with Dr. Thedore Mins nephrology, discussed lab, and pertinent plan.  He agreed with the plan  to discharge home and dialysis tomorrow as scheduled.  Disposition Patient's care signed out at shift change to Sharilyn Sites, PA-C with pending second troponin. Likely discharge if delta trop is negative. Patient has dialysis scheduled for tomorrow.  This chart was dictated using voice recognition software.  Despite best efforts to proofread,  errors can occur which can change the documentation meaning.          Final Clinical Impression(s) / ED Diagnoses Final diagnoses:  Shortness of breath    Rx / DC Orders ED Discharge Orders     None         Jeanelle Malling, [place] 11/25/22 2343    Wynetta Fines, MD 11/26/22 [phone]

## 2022-11-26 LAB — TROPONIN I (HIGH SENSITIVITY): Troponin I (High Sensitivity): 133 ng/L (ref <18)

## 2022-11-26 NOTE — ED Provider Notes (Signed)
  12:32 AM Delta trop 133 which is relatively flat compared to original at 129.  Chronically elevated trops, likely due to his ESRD.  He has appt for dialysis in the AM.  Feel he is stable for discharge.   Garlon Hatchet, PA-C 11/26/22 1610    Glynn Octave, MD 11/26/22 [phone]

## 2022-11-26 NOTE — ED Notes (Signed)
Discharge instructions reviewed. Opportunity provided for questions but denied.   Pt says "I can read it later if I want".   Refuses discharge vital signs. Ambulatory on departure.

## 2022-11-30 ENCOUNTER — Other Ambulatory Visit: Payer: Self-pay

## 2022-11-30 ENCOUNTER — Emergency Department (HOSPITAL_COMMUNITY): Payer: 59

## 2022-11-30 ENCOUNTER — Emergency Department (HOSPITAL_COMMUNITY)
Admission: EM | Admit: 2022-11-30 | Discharge: 2022-12-01 | Disposition: A | Discharge status: Home / Self Care | Payer: 59 | Attending: Emergency Medicine | Admitting: Emergency Medicine

## 2022-11-30 ENCOUNTER — Encounter (HOSPITAL_COMMUNITY): Payer: Self-pay

## 2022-11-30 DIAGNOSIS — E1122 Type 2 diabetes mellitus with diabetic chronic kidney disease: Secondary | ICD-10-CM | POA: Insufficient documentation

## 2022-11-30 DIAGNOSIS — Z992 Dependence on renal dialysis: Secondary | ICD-10-CM | POA: Insufficient documentation

## 2022-11-30 DIAGNOSIS — I5042 Chronic combined systolic (congestive) and diastolic (congestive) heart failure: Secondary | ICD-10-CM | POA: Diagnosis not present

## 2022-11-30 DIAGNOSIS — R609 Edema, unspecified: Secondary | ICD-10-CM | POA: Diagnosis not present

## 2022-11-30 DIAGNOSIS — R140 Abdominal distension (gaseous): Secondary | ICD-10-CM | POA: Diagnosis not present

## 2022-11-30 DIAGNOSIS — Z7982 Long term (current) use of aspirin: Secondary | ICD-10-CM | POA: Diagnosis not present

## 2022-11-30 DIAGNOSIS — R600 Localized edema: Secondary | ICD-10-CM | POA: Insufficient documentation

## 2022-11-30 DIAGNOSIS — R0989 Other specified symptoms and signs involving the circulatory and respiratory systems: Secondary | ICD-10-CM | POA: Diagnosis not present

## 2022-11-30 DIAGNOSIS — N186 End stage renal disease: Secondary | ICD-10-CM | POA: Diagnosis not present

## 2022-11-30 DIAGNOSIS — R0602 Shortness of breath: Secondary | ICD-10-CM | POA: Insufficient documentation

## 2022-11-30 DIAGNOSIS — I213 ST elevation (STEMI) myocardial infarction of unspecified site: Secondary | ICD-10-CM | POA: Diagnosis not present

## 2022-11-30 DIAGNOSIS — I10 Essential (primary) hypertension: Secondary | ICD-10-CM | POA: Diagnosis not present

## 2022-11-30 DIAGNOSIS — R0902 Hypoxemia: Secondary | ICD-10-CM | POA: Diagnosis not present

## 2022-11-30 DIAGNOSIS — I132 Hypertensive heart and chronic kidney disease with heart failure and with stage 5 chronic kidney disease, or end stage renal disease: Secondary | ICD-10-CM | POA: Insufficient documentation

## 2022-11-30 DIAGNOSIS — I517 Cardiomegaly: Secondary | ICD-10-CM | POA: Diagnosis not present

## 2022-11-30 LAB — CBC WITH DIFFERENTIAL/PLATELET
Abs Immature Granulocytes: 0.01 10*3/uL (ref 0.00–0.07)
Basophils Absolute: 0.1 10*3/uL (ref 0.0–0.1)
Basophils Relative: 1 %
Eosinophils Absolute: 0.3 10*3/uL (ref 0.0–0.5)
Eosinophils Relative: 5 %
HCT: 38.4 % — ABNORMAL LOW (ref 39.0–52.0)
Hemoglobin: 11 g/dL — ABNORMAL LOW (ref 13.0–17.0)
Immature Granulocytes: 0 %
Lymphocytes Relative: 18 %
Lymphs Abs: 1.2 10*3/uL (ref 0.7–4.0)
MCH: 26.8 pg (ref 26.0–34.0)
MCHC: 28.6 g/dL — ABNORMAL LOW (ref 30.0–36.0)
MCV: 93.4 fL (ref 80.0–100.0)
Monocytes Absolute: 1.1 10*3/uL — ABNORMAL HIGH (ref 0.1–1.0)
Monocytes Relative: 16 %
Neutro Abs: 4.2 10*3/uL (ref 1.7–7.7)
Neutrophils Relative %: 60 %
Platelets: 308 10*3/uL (ref 150–400)
RBC: 4.11 MIL/uL — ABNORMAL LOW (ref 4.22–5.81)
RDW: 17.2 % — ABNORMAL HIGH (ref 11.5–15.5)
WBC: 6.8 10*3/uL (ref 4.0–10.5)
nRBC: 0 % (ref 0.0–0.2)

## 2022-11-30 LAB — BASIC METABOLIC PANEL
Anion gap: 19 — ABNORMAL HIGH (ref 5–15)
BUN: 48 mg/dL — ABNORMAL HIGH (ref 6–20)
CO2: 23 mmol/L (ref 22–32)
Calcium: 8.3 mg/dL — ABNORMAL LOW (ref 8.9–10.3)
Chloride: 96 mmol/L — ABNORMAL LOW (ref 98–111)
Creatinine, Ser: 7.47 mg/dL — ABNORMAL HIGH (ref 0.61–1.24)
GFR, Estimated: 8 mL/min — ABNORMAL LOW (ref >60)
Glucose, Bld: 174 mg/dL — ABNORMAL HIGH (ref 70–99)
Potassium: 4.3 mmol/L (ref 3.5–5.1)
Sodium: 138 mmol/L (ref 135–145)

## 2022-11-30 LAB — TROPONIN I (HIGH SENSITIVITY): Troponin I (High Sensitivity): 129 ng/L (ref <18)

## 2022-11-30 LAB — BRAIN NATRIURETIC PEPTIDE: B Natriuretic Peptide: >4500 pg/mL — ABNORMAL HIGH (ref 0.0–100.0)

## 2022-11-30 NOTE — ED Triage Notes (Signed)
Arrives EMS with shortness of breath. As usual does not like answering any questions and says "same thing I'm here for all the time".   T,TH,S Dialysis., Says he arrived late and did not receive full treatment.

## 2022-11-30 NOTE — ED Notes (Signed)
Unsuccessful IV attempt x2.  

## 2022-11-30 NOTE — ED Provider Notes (Signed)
[place] EMERGENCY DEPARTMENT AT [facility] Provider Note   CSN: [id number] Arrival date & time: 11/30/22  2204     History {Add pertinent medical, surgical, social history, OB history to HPI:1} Chief Complaint  Patient presents with   Shortness of Breath    [name] is a 52 y.o. male.Patient presents to the emergency department complaining of shortness of breath.  Patient states that when he begins to feel heated being starts to experience increased work of breathing.  He has had multiple emergency department visits for the same.  He states he is supposed to use 2 L/min of oxygen at baseline but has been unable to due to his housing situation.  He is currently living at a ministry type shelter who supposedly will not allow him to have his oxygen tank on the premises.  The patient is a Tuesday, Thursday, Saturday diabetic who did go to treatment today but did not complete a full session due to being 1 hour late.  The patient does have some lower extremity edema but states this is baseline for him.  He is currently denying chest pain, abdominal pain, nausea, vomiting, headache, fever.  Past medical history significant for type II DM, ESRD on dialysis, hypertension, left bundle branch block, chronic combined systolic and diastolic CHF  HPI     Home Medications Prior to Admission medications   Medication Sig Start Date End Date Taking? Authorizing Provider  acetaminophen (TYLENOL) 500 MG tablet Take 1 tablet (500 mg total) by mouth every 8 (eight) hours as needed for mild pain. 05/28/22   Leroy Sea, MD  ASPIRIN LOW DOSE 81 MG tablet Take 1 tablet (81 mg total) by mouth daily. 05/28/22   Leroy Sea, MD  benzonatate (TESSALON) 100 MG capsule Take 1 capsule (100 mg total) by mouth every 8 (eight) hours. 09/09/22   Sabas Sous, MD  calcitRIOL (ROCALTROL) 0.25 MCG capsule Take 1 capsule (0.25 mcg total) by mouth every Tuesday, Thursday, and Saturday at 6 PM.  10/26/22   Ghimire, Werner Lean, MD  calcium acetate (PHOSLO) 667 MG capsule Take 3 capsules (2,001 mg total) by mouth 3 (three) times daily. 05/28/22   Leroy Sea, MD  carvedilol (COREG) 25 MG tablet Take 1 tablet (25 mg total) by mouth 2 (two) times daily with a meal. 10/26/22   Ghimire, Werner Lean, MD  glucose blood test strip Use to check fasting blood sugar once daily. 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 10/26/22   [provider]  insulin aspart (NOVOLOG FLEXPEN) 100 UNIT/ML FlexPen 0-9 Units, Subcutaneous, 3 times daily with meals CBG < 70: Implement Hypoglycemia measures CBG 70 - 120: 0 units CBG 121 - 150: 1 unit CBG 151 - 200: 2 units CBG 201 - 250: 3 units CBG 251 - 300: 5 units CBG 301 - 350: 7 units CBG 351 - 400: 9 units CBG > 400: call MD 10/26/22   Maretta Bees, MD  insulin glargine-yfgn (SEMGLEE) 100 UNIT/ML injection Inject 0.07 mLs (7 Units total) into the skin at bedtime. 10/26/22   Ghimire, Werner Lean, MD  Insulin Pen Needle 32G X 4 MM MISC Use with insulin pens 05/28/22   Leroy Sea, MD  isosorbide mononitrate (IMDUR) 60 MG 24 hr tablet Take 1 tablet (60 mg total) by mouth daily. 10/26/22   Ghimire, Werner Lean, MD  melatonin 3  MG TABS tablet Take 1 tablet (3 mg total) by mouth at bedtime as needed (insomnia). 10/26/22   Ghimire, Werner Lean, MD  oxyCODONE-acetaminophen (PERCOCET/ROXICET) 5-325 MG tablet Take 1 tablet by mouth every 12 (twelve) hours as needed for severe pain. 10/26/22   Ghimire, Werner Lean, MD  PROTONIX 40 MG tablet Take 1 tablet (40 mg total) by mouth daily. 05/25/22   Leroy Sea, MD  simethicone (MYLICON) 80 MG chewable tablet Chew 1 tablet (80 mg total) by mouth 4 (four) times daily for 5 days. 11/22/22 12/17/22  Rodolph Bong, MD  gabapentin (NEURONTIN) 300 MG capsule Take 1 capsule (300 mg total) by mouth 3 (three) times daily. Patient not taking: Reported on  07/27/2019 07/29/18 08/30/19  Kallie Locks, FNP  sildenafil (VIAGRA) 25 MG tablet Take 1 tablet (25 mg total) by mouth as needed for erectile dysfunction. for erectile dysfunction. Patient not taking: Reported on 08/11/2020 10/13/19 11/27/20  Kallie Locks, FNP      Allergies    Sulfa antibiotics, Other, Bactrim [sulfamethoxazole-trimethoprim], and Cephalexin    Review of Systems   Review of Systems  Physical Exam Updated Vital Signs BP (!) 171/97 (BP Location: Left Arm)   Pulse 81   Temp 98.4 F (36.9 C)   Resp 17   Ht 5\' 7"  (1.702 m)   Wt 65.8 kg   SpO2 100% Comment: Simultaneous filing. User may not have seen previous data.  BMI 22.71 kg/m  Physical Exam Vitals and nursing note reviewed.  Constitutional:      General: He is not in acute distress.    Appearance: He is well-developed.  HENT:     Head: Normocephalic and atraumatic.  Eyes:     Conjunctiva/sclera: Conjunctivae normal.  Cardiovascular:     Rate and Rhythm: Normal rate and regular rhythm.  Pulmonary:     Effort: Pulmonary effort is normal. No respiratory distress.     Breath sounds: Normal breath sounds.  Abdominal:     Palpations: Abdomen is soft.     Tenderness: There is no abdominal tenderness.  Musculoskeletal:        General: No swelling.     Cervical back: Neck supple.     Right lower leg: Edema present.     Left lower leg: Edema present.     Comments: +1 edema in bilateral lower extremities  Skin:    General: Skin is warm and dry.     Capillary Refill: Capillary refill takes less than 2 seconds.  Neurological:     Mental Status: He is alert.  Psychiatric:        Mood and Affect: Mood normal.     ED Results / Procedures / Treatments   Labs (all labs ordered are listed, but only abnormal results are displayed) Labs Reviewed  BASIC METABOLIC PANEL  CBC WITH DIFFERENTIAL/PLATELET  BRAIN NATRIURETIC PEPTIDE    EKG EKG Interpretation Date/Time:  Saturday November 30 2022 22:10:32  EDT Ventricular Rate:  83 PR Interval:  149 QRS Duration:  151 QT Interval:  463 QTC Calculation: 545 R Axis:   196  Text Interpretation: Sinus rhythm LAE, consider biatrial enlargement Nonspecific intraventricular conduction delay Anterior infarct, possibly acute Lateral leads are also involved Confirmed by Glyn Ade [phone]) on 11/30/2022 10:12:48 PM  Radiology No results found.  Procedures Procedures  {Document cardiac monitor, telemetry assessment procedure when appropriate:1}  Medications Ordered in ED Medications - No data to display  ED Course/ Medical Decision Making/ A&P   {  Click here for ABCD2, HEART and other calculatorsREFRESH Note before signing :1}                          Medical Decision Making Amount and/or Complexity of Data Reviewed Labs: ordered. Radiology: ordered.   ***  {Document critical care time when appropriate:1} {Document review of labs and clinical decision tools ie heart score, Chads2Vasc2 etc:1}  {Document your independent review of radiology images, and any outside records:1} {Document your discussion with family members, caretakers, and with consultants:1} {Document social determinants of health affecting pt's care:1} {Document your decision making why or why not admission, treatments were needed:1} Final Clinical Impression(s) / ED Diagnoses Final diagnoses:  None    Rx / DC Orders ED Discharge Orders     None

## 2022-12-01 NOTE — Discharge Instructions (Signed)
You were evaluated today for shortness of breath. Your lab work and chest x-ray was reassuring. Please follow up as needed with your primary team. If you develop any life threatening symptoms please return to the emergency department

## 2022-12-01 NOTE — ED Notes (Signed)
Pt removed cardiac monitor leads, 02 sat and BP cords, as well as nasal cannula by self. Refused sitting in pt bed, and declined to wear nasal cannula at this time.

## 2022-12-04 ENCOUNTER — Emergency Department (HOSPITAL_COMMUNITY)
Admission: EM | Admit: 2022-12-04 | Discharge: 2022-12-04 | Disposition: A | Discharge status: Home / Self Care | Payer: 59 | Source: Home / Self Care | Attending: Emergency Medicine | Admitting: Emergency Medicine

## 2022-12-04 ENCOUNTER — Encounter (HOSPITAL_COMMUNITY): Payer: Self-pay

## 2022-12-04 ENCOUNTER — Other Ambulatory Visit: Payer: Self-pay

## 2022-12-04 ENCOUNTER — Observation Stay (HOSPITAL_COMMUNITY)
Admission: EM | Admit: 2022-12-04 | Discharge: 2022-12-05 | Discharge status: Against Medical Advice | Payer: 59 | Attending: Internal Medicine | Admitting: Internal Medicine

## 2022-12-04 ENCOUNTER — Emergency Department (HOSPITAL_COMMUNITY): Payer: 59

## 2022-12-04 DIAGNOSIS — D631 Anemia in chronic kidney disease: Secondary | ICD-10-CM | POA: Diagnosis present

## 2022-12-04 DIAGNOSIS — I132 Hypertensive heart and chronic kidney disease with heart failure and with stage 5 chronic kidney disease, or end stage renal disease: Secondary | ICD-10-CM | POA: Insufficient documentation

## 2022-12-04 DIAGNOSIS — Z794 Long term (current) use of insulin: Secondary | ICD-10-CM | POA: Insufficient documentation

## 2022-12-04 DIAGNOSIS — J9601 Acute respiratory failure with hypoxia: Secondary | ICD-10-CM | POA: Diagnosis not present

## 2022-12-04 DIAGNOSIS — Z79899 Other long term (current) drug therapy: Secondary | ICD-10-CM | POA: Insufficient documentation

## 2022-12-04 DIAGNOSIS — Z8249 Family history of ischemic heart disease and other diseases of the circulatory system: Secondary | ICD-10-CM | POA: Diagnosis not present

## 2022-12-04 DIAGNOSIS — Z881 Allergy status to other antibiotic agents status: Secondary | ICD-10-CM

## 2022-12-04 DIAGNOSIS — Z7982 Long term (current) use of aspirin: Secondary | ICD-10-CM | POA: Insufficient documentation

## 2022-12-04 DIAGNOSIS — I10 Essential (primary) hypertension: Secondary | ICD-10-CM | POA: Diagnosis not present

## 2022-12-04 DIAGNOSIS — I5023 Acute on chronic systolic (congestive) heart failure: Secondary | ICD-10-CM | POA: Diagnosis present

## 2022-12-04 DIAGNOSIS — Z5941 Food insecurity: Secondary | ICD-10-CM

## 2022-12-04 DIAGNOSIS — E1122 Type 2 diabetes mellitus with diabetic chronic kidney disease: Secondary | ICD-10-CM | POA: Insufficient documentation

## 2022-12-04 DIAGNOSIS — Z888 Allergy status to other drugs, medicaments and biological substances status: Secondary | ICD-10-CM | POA: Diagnosis not present

## 2022-12-04 DIAGNOSIS — I160 Hypertensive urgency: Secondary | ICD-10-CM | POA: Diagnosis present

## 2022-12-04 DIAGNOSIS — R739 Hyperglycemia, unspecified: Secondary | ICD-10-CM | POA: Diagnosis not present

## 2022-12-04 DIAGNOSIS — Z992 Dependence on renal dialysis: Secondary | ICD-10-CM | POA: Insufficient documentation

## 2022-12-04 DIAGNOSIS — F419 Anxiety disorder, unspecified: Secondary | ICD-10-CM | POA: Diagnosis not present

## 2022-12-04 DIAGNOSIS — R42 Dizziness and giddiness: Secondary | ICD-10-CM | POA: Insufficient documentation

## 2022-12-04 DIAGNOSIS — R609 Edema, unspecified: Secondary | ICD-10-CM | POA: Diagnosis not present

## 2022-12-04 DIAGNOSIS — I5043 Acute on chronic combined systolic (congestive) and diastolic (congestive) heart failure: Secondary | ICD-10-CM | POA: Diagnosis not present

## 2022-12-04 DIAGNOSIS — Z5329 Procedure and treatment not carried out because of patient's decision for other reasons: Secondary | ICD-10-CM | POA: Diagnosis not present

## 2022-12-04 DIAGNOSIS — I120 Hypertensive chronic kidney disease with stage 5 chronic kidney disease or end stage renal disease: Secondary | ICD-10-CM | POA: Diagnosis not present

## 2022-12-04 DIAGNOSIS — Z91199 Patient's noncompliance with other medical treatment and regimen due to unspecified reason: Secondary | ICD-10-CM

## 2022-12-04 DIAGNOSIS — Z8052 Family history of malignant neoplasm of bladder: Secondary | ICD-10-CM

## 2022-12-04 DIAGNOSIS — N186 End stage renal disease: Secondary | ICD-10-CM | POA: Insufficient documentation

## 2022-12-04 DIAGNOSIS — R5381 Other malaise: Secondary | ICD-10-CM | POA: Diagnosis not present

## 2022-12-04 DIAGNOSIS — Z8614 Personal history of Methicillin resistant Staphylococcus aureus infection: Secondary | ICD-10-CM | POA: Diagnosis not present

## 2022-12-04 DIAGNOSIS — N2581 Secondary hyperparathyroidism of renal origin: Secondary | ICD-10-CM | POA: Diagnosis not present

## 2022-12-04 DIAGNOSIS — R0989 Other specified symptoms and signs involving the circulatory and respiratory systems: Secondary | ICD-10-CM | POA: Diagnosis not present

## 2022-12-04 DIAGNOSIS — Z91158 Patient's noncompliance with renal dialysis for other reason: Secondary | ICD-10-CM | POA: Diagnosis not present

## 2022-12-04 DIAGNOSIS — I509 Heart failure, unspecified: Secondary | ICD-10-CM | POA: Insufficient documentation

## 2022-12-04 DIAGNOSIS — I447 Left bundle-branch block, unspecified: Secondary | ICD-10-CM | POA: Diagnosis not present

## 2022-12-04 DIAGNOSIS — S42032A Displaced fracture of lateral end of left clavicle, initial encounter for closed fracture: Secondary | ICD-10-CM | POA: Diagnosis not present

## 2022-12-04 DIAGNOSIS — E785 Hyperlipidemia, unspecified: Secondary | ICD-10-CM | POA: Diagnosis not present

## 2022-12-04 DIAGNOSIS — Z5900 Homelessness unspecified: Secondary | ICD-10-CM

## 2022-12-04 DIAGNOSIS — Z5982 Transportation insecurity: Secondary | ICD-10-CM

## 2022-12-04 DIAGNOSIS — K219 Gastro-esophageal reflux disease without esophagitis: Secondary | ICD-10-CM | POA: Diagnosis not present

## 2022-12-04 DIAGNOSIS — J811 Chronic pulmonary edema: Secondary | ICD-10-CM | POA: Diagnosis not present

## 2022-12-04 DIAGNOSIS — Z882 Allergy status to sulfonamides status: Secondary | ICD-10-CM | POA: Diagnosis not present

## 2022-12-04 DIAGNOSIS — J810 Acute pulmonary edema: Secondary | ICD-10-CM | POA: Diagnosis not present

## 2022-12-04 DIAGNOSIS — Z5986 Financial insecurity: Secondary | ICD-10-CM

## 2022-12-04 DIAGNOSIS — Z811 Family history of alcohol abuse and dependence: Secondary | ICD-10-CM

## 2022-12-04 DIAGNOSIS — R0902 Hypoxemia: Secondary | ICD-10-CM | POA: Diagnosis not present

## 2022-12-04 DIAGNOSIS — R0602 Shortness of breath: Secondary | ICD-10-CM | POA: Diagnosis not present

## 2022-12-04 HISTORY — DX: Disorder of kidney and ureter, unspecified: N28.9

## 2022-12-04 LAB — CBC WITH DIFFERENTIAL/PLATELET
Abs Immature Granulocytes: 0.01 10*3/uL (ref 0.00–0.07)
Abs Immature Granulocytes: 0.01 10*3/uL (ref 0.00–0.07)
Basophils Absolute: 0 10*3/uL (ref 0.0–0.1)
Basophils Absolute: 0 10*3/uL (ref 0.0–0.1)
Basophils Relative: 0 %
Basophils Relative: 0 %
Eosinophils Absolute: 0.3 10*3/uL (ref 0.0–0.5)
Eosinophils Absolute: 0.3 10*3/uL (ref 0.0–0.5)
Eosinophils Relative: 6 %
Eosinophils Relative: 6 %
HCT: 38.9 % — ABNORMAL LOW (ref 39.0–52.0)
HCT: 39.3 % (ref 39.0–52.0)
Hemoglobin: 11.5 g/dL — ABNORMAL LOW (ref 13.0–17.0)
Hemoglobin: 11.8 g/dL — ABNORMAL LOW (ref 13.0–17.0)
Immature Granulocytes: 0 %
Immature Granulocytes: 0 %
Lymphocytes Relative: 15 %
Lymphocytes Relative: 16 %
Lymphs Abs: 0.9 10*3/uL (ref 0.7–4.0)
Lymphs Abs: 0.8 10*3/uL (ref 0.7–4.0)
MCH: 25.4 pg — ABNORMAL LOW (ref 26.0–34.0)
MCH: 25.4 pg — ABNORMAL LOW (ref 26.0–34.0)
MCHC: 29.6 g/dL — ABNORMAL LOW (ref 30.0–36.0)
MCHC: 30 g/dL (ref 30.0–36.0)
MCV: 85.9 fL (ref 80.0–100.0)
MCV: 84.7 fL (ref 80.0–100.0)
Monocytes Absolute: 0.7 10*3/uL (ref 0.1–1.0)
Monocytes Absolute: 0.6 10*3/uL (ref 0.1–1.0)
Monocytes Relative: 13 %
Monocytes Relative: 13 %
Neutro Abs: 3.7 10*3/uL (ref 1.7–7.7)
Neutro Abs: 3.3 10*3/uL (ref 1.7–7.7)
Neutrophils Relative %: 66 %
Neutrophils Relative %: 65 %
Platelets: 326 10*3/uL (ref 150–400)
Platelets: 315 10*3/uL (ref 150–400)
RBC: 4.53 MIL/uL (ref 4.22–5.81)
RBC: 4.64 MIL/uL (ref 4.22–5.81)
RDW: 17.2 % — ABNORMAL HIGH (ref 11.5–15.5)
RDW: 17.2 % — ABNORMAL HIGH (ref 11.5–15.5)
WBC: 5.6 10*3/uL (ref 4.0–10.5)
WBC: 5 10*3/uL (ref 4.0–10.5)
nRBC: 0.4 % — ABNORMAL HIGH (ref 0.0–0.2)
nRBC: 0 % (ref 0.0–0.2)

## 2022-12-04 LAB — BASIC METABOLIC PANEL
Anion gap: 18 — ABNORMAL HIGH (ref 5–15)
Anion gap: 16 — ABNORMAL HIGH (ref 5–15)
BUN: 52 mg/dL — ABNORMAL HIGH (ref 6–20)
BUN: 54 mg/dL — ABNORMAL HIGH (ref 6–20)
CO2: 24 mmol/L (ref 22–32)
CO2: 24 mmol/L (ref 22–32)
Calcium: 8 mg/dL — ABNORMAL LOW (ref 8.9–10.3)
Calcium: 7.8 mg/dL — ABNORMAL LOW (ref 8.9–10.3)
Chloride: 96 mmol/L — ABNORMAL LOW (ref 98–111)
Chloride: 97 mmol/L — ABNORMAL LOW (ref 98–111)
Creatinine, Ser: 8.21 mg/dL — ABNORMAL HIGH (ref 0.61–1.24)
Creatinine, Ser: 8.89 mg/dL — ABNORMAL HIGH (ref 0.61–1.24)
GFR, Estimated: 7 mL/min — ABNORMAL LOW (ref >60)
GFR, Estimated: 7 mL/min — ABNORMAL LOW (ref >60)
Glucose, Bld: 133 mg/dL — ABNORMAL HIGH (ref 70–99)
Glucose, Bld: 268 mg/dL — ABNORMAL HIGH (ref 70–99)
Potassium: 4.4 mmol/L (ref 3.5–5.1)
Potassium: 4.5 mmol/L (ref 3.5–5.1)
Sodium: 138 mmol/L (ref 135–145)
Sodium: 137 mmol/L (ref 135–145)

## 2022-12-04 NOTE — Discharge Instructions (Signed)
Continue your dialysis as scheduled. Follow-up with your doctor. Return here for new concerns.

## 2022-12-04 NOTE — ED Triage Notes (Signed)
Pt arrived from [facility] c/o dizziness x 2 days. Pt refuses to cooperate with staff both assessments and questions. Pt states that he had dialysis yesterday but arrived late and only got about half completed.

## 2022-12-04 NOTE — ED Provider Notes (Signed)
[place] EMERGENCY DEPARTMENT AT [facility] Provider Note   CSN: [id number] Arrival date & time: 12/04/22  0418     History  Chief Complaint  Patient presents with   Dizziness    [name] is a 52 y.o. male.  The history is provided by the patient and medical records.  Dizziness  52 y.o. M with hx of ESRD on HD, diabetes, anxiety, CHF, hypertension, presenting to the ED for reported dizziness.  I initially evaluated patient in triage.  He was sleeping in wheelchair and reported "I am here because I am dizzy".  He refused to answer any further questions for me but did ultimately tell RN that he had partial dialysis yesterday since he arrived late.  Upon movement to treatment room, he has been curled up on the ED stretcher and sleeping for the past hour.  He is well-known to ED with similar presentations in the past.  Home Medications Prior to Admission medications   Medication Sig Start Date End Date Taking? Authorizing Provider  acetaminophen (TYLENOL) 500 MG tablet Take 1 tablet (500 mg total) by mouth every 8 (eight) hours as needed for mild pain. 05/28/22   Leroy Sea, MD  ASPIRIN LOW DOSE 81 MG tablet Take 1 tablet (81 mg total) by mouth daily. 05/28/22   Leroy Sea, MD  benzonatate (TESSALON) 100 MG capsule Take 1 capsule (100 mg total) by mouth every 8 (eight) hours. 09/09/22   Sabas Sous, MD  calcitRIOL (ROCALTROL) 0.25 MCG capsule Take 1 capsule (0.25 mcg total) by mouth every Tuesday, Thursday, and Saturday at 6 PM. 10/26/22   Ghimire, Werner Lean, MD  calcium acetate (PHOSLO) 667 MG capsule Take 3 capsules (2,001 mg total) by mouth 3 (three) times daily. 05/28/22   Leroy Sea, MD  carvedilol (COREG) 25 MG tablet Take 1 tablet (25 mg total) by mouth 2 (two) times daily with a meal. 10/26/22   Ghimire, Werner Lean, MD  glucose blood test strip Use to check fasting blood sugar once daily. 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 10/26/22   [provider]  insulin aspart (NOVOLOG FLEXPEN) 100 UNIT/ML FlexPen 0-9 Units, Subcutaneous, 3 times daily with meals CBG < 70: Implement Hypoglycemia measures CBG 70 - 120: 0 units CBG 121 - 150: 1 unit CBG 151 - 200: 2 units CBG 201 - 250: 3 units CBG 251 - 300: 5 units CBG 301 - 350: 7 units CBG 351 - 400: 9 units CBG > 400: call MD 10/26/22   Maretta Bees, MD  insulin glargine-yfgn (SEMGLEE) 100 UNIT/ML injection Inject 0.07 mLs (7 Units total) into the skin at bedtime. 10/26/22   Ghimire, Werner Lean, MD  Insulin Pen Needle 32G X 4 MM MISC Use with insulin pens 05/28/22   Leroy Sea, MD  isosorbide mononitrate (IMDUR) 60 MG 24 hr tablet Take 1 tablet (60 mg total) by mouth daily. 10/26/22   Ghimire, Werner Lean, MD  melatonin 3 MG TABS tablet Take 1 tablet (3 mg total) by mouth at bedtime as needed (insomnia). 10/26/22   Ghimire, Werner Lean, MD  oxyCODONE-acetaminophen (PERCOCET/ROXICET) 5-325 MG tablet Take 1 tablet by mouth every 12 (twelve) hours as needed for severe pain. 10/26/22   Ghimire, Werner Lean, MD  PROTONIX 40 MG tablet Take 1 tablet (40 mg total) by mouth daily. 05/25/22   Susa Raring  K, MD  simethicone (MYLICON) 80 MG chewable tablet Chew 1 tablet (80 mg total) by mouth 4 (four) times daily for 5 days. 11/22/22 12/17/22  Rodolph Bong, MD  gabapentin (NEURONTIN) 300 MG capsule Take 1 capsule (300 mg total) by mouth 3 (three) times daily. Patient not taking: Reported on 07/27/2019 07/29/18 08/30/19  Kallie Locks, FNP  sildenafil (VIAGRA) 25 MG tablet Take 1 tablet (25 mg total) by mouth as needed for erectile dysfunction. for erectile dysfunction. Patient not taking: Reported on 08/11/2020 10/13/19 11/27/20  Kallie Locks, FNP      Allergies    Sulfa antibiotics, Other, Bactrim [sulfamethoxazole-trimethoprim], and Cephalexin    Review of Systems   Review of Systems  Neurological:   Positive for dizziness.  All other systems reviewed and are negative.   Physical Exam Updated Vital Signs BP (!) 154/94   Pulse 78   Resp 19   SpO2 94%   Physical Exam Vitals and nursing note reviewed.  Constitutional:      Appearance: He is well-developed.     Comments: Sleeping on ED stretcher, NAD, uncooperative and refusing to answer most questions  HENT:     Head: Normocephalic and atraumatic.  Eyes:     Conjunctiva/sclera: Conjunctivae normal.     Pupils: Pupils are equal, round, and reactive to light.  Cardiovascular:     Rate and Rhythm: Normal rate and regular rhythm.     Heart sounds: Normal heart sounds.  Pulmonary:     Effort: Pulmonary effort is normal.     Breath sounds: Normal breath sounds.  Abdominal:     General: Bowel sounds are normal.     Palpations: Abdomen is soft.  Musculoskeletal:        General: Normal range of motion.     Cervical back: Normal range of motion.     Comments: Fistula RUE  Skin:    General: Skin is warm and dry.  Neurological:     Mental Status: He is oriented to person, place, and time.     Comments: Sleepy but oriented x3, no focal deficits     ED Results / Procedures / Treatments   Labs (all labs ordered are listed, but only abnormal results are displayed) Labs Reviewed  CBC WITH DIFFERENTIAL/PLATELET - Abnormal; Notable for the following components:      Result Value   Hemoglobin 11.5 (*)    HCT 38.9 (*)    MCH 25.4 (*)    MCHC 29.6 (*)    RDW 17.2 (*)    nRBC 0.4 (*)    All other components within normal limits  BASIC METABOLIC PANEL - Abnormal; Notable for the following components:   Chloride 96 (*)    Glucose, Bld 133 (*)    BUN 52 (*)    Creatinine, Ser 8.21 (*)    Calcium 8.0 (*)    GFR, Estimated 7 (*)    Anion gap 18 (*)    All other components within normal limits    EKG None  Radiology No results found.  Procedures Procedures    Medications Ordered in ED Medications - No data to  display  ED Course/ Medical Decision Making/ A&P                             Medical Decision Making Amount and/or Complexity of Data Reviewed Labs: ordered. ECG/medicine tests: ordered and independent interpretation performed.   52 year old male presenting  [facility] with [facility].  He is well-known to this facility.  He is rather vague in his complaints and does not really give any descriptive answers, preferring to sleep/sit with eyes closed.  He has a history of similar behavior in the past.  Once awoken from sleep he is oriented x 3.  He does not have any focal deficits.  Does report he had partial dialysis treatment yesterday as he showed up late.  His EKG today is nonischemic.  His labs are overall reassuring, potassium is normal.  I do not suspect acute TIA or CVA.  Does not need emergent dialysis today.  I recommended that he follow-up closely with his primary care doctor and continue his usual dialysis schedule.  He can return here for any new or acute changes.  Final Clinical Impression(s) / ED Diagnoses Final diagnoses:  Dizziness    Rx / DC Orders ED Discharge Orders     None         Garlon Hatchet, PA-C 12/04/22 0620    Sloan Leiter, DO 12/05/22 [phone]

## 2022-12-04 NOTE — ED Notes (Signed)
Patient refusing to leave room at discharge. MD notified. Security called to bedside.

## 2022-12-04 NOTE — ED Provider Triage Note (Signed)
Emergency Medicine Provider Triage Evaluation Note  [name] , a 52 y.o. male  was evaluated in triage.  Pt complains of dizziness.  Will not give further details such as headache, nausea/vomiting, etc.  When asked if he attended recent dialysis he refuses to answer.  Review of Systems  Positive: dizziness Negative: fever  Physical Exam    Gen:   Awake, no distress   Resp:  Normal effort  MSK:   Moves extremities without difficulty  Other:  Fistula RUE  Medical Decision Making  Medically screening exam initiated at 4:26 AM.  Appropriate orders placed.  ZAHARI XIANG was informed that the remainder of the evaluation will be completed by another provider, this initial triage assessment does not replace that evaluation, and the importance of remaining in the ED until their evaluation is complete.  Dizziness.  Very vague in complaints, not giving much detail.  Known ESRD patient.  Will check EKG, labs.   Garlon Hatchet, PA-C 12/04/22 [phone]

## 2022-12-04 NOTE — ED Triage Notes (Addendum)
Pt bib [facility] from the [facility] where he [facility] today to try to finish his dialysis that he did not finish yesterday. Pt complains of dizziness and weakness. Pt difficult to triage due to not cooperating with questions

## 2022-12-04 NOTE — ED Provider Triage Note (Signed)
Emergency Medicine Provider Triage Evaluation Note  EBB CARELOCK , a 52 y.o. male  was evaluated in triage.  Pt complains of generalized weakness, swelling of abdomen and legs.  Review of Systems  Positive: Generalized weakness, bilateral lower extremity swelling, abdominal distention, exertional shortness of breath Negative: Fevers, chills, chest pain  Physical Exam  BP (!) 162/78 (BP Location: Left Arm)   Pulse 79   Temp 98.3 F (36.8 C) (Oral)   Resp 17   SpO2 96%  Gen:   Awake, no distress, watching video on his phone Resp:  Normal effort MSK:   Moves extremities without difficulty Other:  Pitting edema to lower extremities, mild abdominal distention without any significant tenderness  Medical Decision Making  Medically screening exam initiated at 3:58 PM.  Appropriate orders placed.  PACO CISLO was informed that the remainder of the evaluation will be completed by another provider, this initial triage assessment does not replace that evaluation, and the importance of remaining in the ED until their evaluation is complete.  Patient presenting for generalized weakness and swelling to his abdomen and lower extremities.  He undergoes dialysis on Tuesday, Thursday, Saturday.  He was late to his session on Saturday and only got 2 hours of HD.  He feels that he "needs to get some fluid off".   Gloris Manchester, MD 12/04/22 1600

## 2022-12-05 ENCOUNTER — Emergency Department (HOSPITAL_COMMUNITY): Payer: 59

## 2022-12-05 DIAGNOSIS — I132 Hypertensive heart and chronic kidney disease with heart failure and with stage 5 chronic kidney disease, or end stage renal disease: Secondary | ICD-10-CM | POA: Diagnosis not present

## 2022-12-05 DIAGNOSIS — Z5900 Homelessness unspecified: Secondary | ICD-10-CM | POA: Diagnosis not present

## 2022-12-05 DIAGNOSIS — N186 End stage renal disease: Secondary | ICD-10-CM | POA: Diagnosis not present

## 2022-12-05 DIAGNOSIS — Z881 Allergy status to other antibiotic agents status: Secondary | ICD-10-CM | POA: Diagnosis not present

## 2022-12-05 DIAGNOSIS — J810 Acute pulmonary edema: Secondary | ICD-10-CM | POA: Diagnosis not present

## 2022-12-05 DIAGNOSIS — R5381 Other malaise: Secondary | ICD-10-CM | POA: Diagnosis not present

## 2022-12-05 DIAGNOSIS — Z5329 Procedure and treatment not carried out because of patient's decision for other reasons: Secondary | ICD-10-CM | POA: Diagnosis present

## 2022-12-05 DIAGNOSIS — I160 Hypertensive urgency: Secondary | ICD-10-CM | POA: Diagnosis present

## 2022-12-05 DIAGNOSIS — Z91158 Patient's noncompliance with renal dialysis for other reason: Secondary | ICD-10-CM | POA: Diagnosis not present

## 2022-12-05 DIAGNOSIS — I447 Left bundle-branch block, unspecified: Secondary | ICD-10-CM | POA: Diagnosis present

## 2022-12-05 DIAGNOSIS — S42032A Displaced fracture of lateral end of left clavicle, initial encounter for closed fracture: Secondary | ICD-10-CM | POA: Diagnosis not present

## 2022-12-05 DIAGNOSIS — F419 Anxiety disorder, unspecified: Secondary | ICD-10-CM | POA: Diagnosis present

## 2022-12-05 DIAGNOSIS — K219 Gastro-esophageal reflux disease without esophagitis: Secondary | ICD-10-CM | POA: Diagnosis present

## 2022-12-05 DIAGNOSIS — I5043 Acute on chronic combined systolic (congestive) and diastolic (congestive) heart failure: Secondary | ICD-10-CM

## 2022-12-05 DIAGNOSIS — J811 Chronic pulmonary edema: Secondary | ICD-10-CM | POA: Diagnosis not present

## 2022-12-05 DIAGNOSIS — R0989 Other specified symptoms and signs involving the circulatory and respiratory systems: Secondary | ICD-10-CM | POA: Diagnosis not present

## 2022-12-05 DIAGNOSIS — R0602 Shortness of breath: Secondary | ICD-10-CM | POA: Diagnosis not present

## 2022-12-05 DIAGNOSIS — Z992 Dependence on renal dialysis: Secondary | ICD-10-CM | POA: Diagnosis not present

## 2022-12-05 DIAGNOSIS — E1122 Type 2 diabetes mellitus with diabetic chronic kidney disease: Secondary | ICD-10-CM | POA: Diagnosis present

## 2022-12-05 DIAGNOSIS — N2581 Secondary hyperparathyroidism of renal origin: Secondary | ICD-10-CM | POA: Diagnosis present

## 2022-12-05 DIAGNOSIS — D631 Anemia in chronic kidney disease: Secondary | ICD-10-CM | POA: Diagnosis not present

## 2022-12-05 DIAGNOSIS — Z8249 Family history of ischemic heart disease and other diseases of the circulatory system: Secondary | ICD-10-CM | POA: Diagnosis not present

## 2022-12-05 DIAGNOSIS — E785 Hyperlipidemia, unspecified: Secondary | ICD-10-CM | POA: Diagnosis present

## 2022-12-05 DIAGNOSIS — J9601 Acute respiratory failure with hypoxia: Secondary | ICD-10-CM | POA: Diagnosis present

## 2022-12-05 DIAGNOSIS — Z8614 Personal history of Methicillin resistant Staphylococcus aureus infection: Secondary | ICD-10-CM | POA: Diagnosis not present

## 2022-12-05 DIAGNOSIS — Z794 Long term (current) use of insulin: Secondary | ICD-10-CM | POA: Diagnosis not present

## 2022-12-05 DIAGNOSIS — Z882 Allergy status to sulfonamides status: Secondary | ICD-10-CM | POA: Diagnosis not present

## 2022-12-05 DIAGNOSIS — Z91199 Patient's noncompliance with other medical treatment and regimen due to unspecified reason: Secondary | ICD-10-CM | POA: Diagnosis not present

## 2022-12-05 DIAGNOSIS — Z888 Allergy status to other drugs, medicaments and biological substances status: Secondary | ICD-10-CM | POA: Diagnosis not present

## 2022-12-05 DIAGNOSIS — I509 Heart failure, unspecified: Secondary | ICD-10-CM

## 2022-12-05 DIAGNOSIS — I5023 Acute on chronic systolic (congestive) heart failure: Secondary | ICD-10-CM | POA: Diagnosis not present

## 2022-12-05 DIAGNOSIS — R42 Dizziness and giddiness: Secondary | ICD-10-CM | POA: Diagnosis not present

## 2022-12-05 LAB — HEPATIC FUNCTION PANEL
ALT: 31 U/L (ref 0–44)
AST: 51 U/L — ABNORMAL HIGH (ref 15–41)
Albumin: 3.5 g/dL (ref 3.5–5.0)
Alkaline Phosphatase: 263 U/L — ABNORMAL HIGH (ref 38–126)
Bilirubin, Direct: 0.4 mg/dL — ABNORMAL HIGH (ref 0.0–0.2)
Indirect Bilirubin: 0.5 mg/dL (ref 0.3–0.9)
Total Bilirubin: 0.9 mg/dL (ref 0.3–1.2)
Total Protein: 8.3 g/dL — ABNORMAL HIGH (ref 6.5–8.1)

## 2022-12-05 LAB — BRAIN NATRIURETIC PEPTIDE: B Natriuretic Peptide: >4500 pg/mL — ABNORMAL HIGH (ref 0.0–100.0)

## 2022-12-05 LAB — TROPONIN I (HIGH SENSITIVITY)
Troponin I (High Sensitivity): 108 ng/L (ref <18)
Troponin I (High Sensitivity): 114 ng/L (ref <18)

## 2022-12-05 MED ORDER — ASPIRIN 81 MG PO TBEC: 81.0000 mg | DELAYED_RELEASE_TABLET | Freq: Every day | ORAL | Status: DC

## 2022-12-05 MED ORDER — CHLORHEXIDINE GLUCONATE CLOTH 2 % EX PADS: 6.0000 | MEDICATED_PAD | Freq: Every day | CUTANEOUS | Status: DC

## 2022-12-05 MED ORDER — ALTEPLASE 2 MG IJ SOLR: 2.0000 mg | Freq: Once | INTRAMUSCULAR | Status: DC | PRN

## 2022-12-05 MED ORDER — CALCITRIOL 0.25 MCG PO CAPS
0.2500 ug | ORAL_CAPSULE | ORAL | Status: DC
  Filled 2022-12-05: qty 1

## 2022-12-05 MED ORDER — LIDOCAINE HCL (PF) 1 % IJ SOLN: 5.0000 mL | INTRAMUSCULAR | Status: DC | PRN

## 2022-12-05 MED ORDER — PENTAFLUOROPROP-TETRAFLUOROETH EX AERO: 1.0000 | INHALATION_SPRAY | CUTANEOUS | Status: DC | PRN

## 2022-12-05 MED ORDER — ACETAMINOPHEN 500 MG PO TABS: 500.0000 mg | ORAL_TABLET | Freq: Three times a day (TID) | ORAL | Status: DC | PRN

## 2022-12-05 MED ORDER — CARVEDILOL 12.5 MG PO TABS
25.0000 mg | ORAL_TABLET | Freq: Two times a day (BID) | ORAL | Status: DC
  Filled 2022-12-05: qty 1

## 2022-12-05 MED ORDER — INSULIN GLARGINE-YFGN 100 UNIT/ML ~~LOC~~ SOLN: 7.0000 [IU] | Freq: Every day | SUBCUTANEOUS | Status: DC

## 2022-12-05 MED ORDER — LIDOCAINE-PRILOCAINE 2.5-2.5 % EX CREA: 1.0000 | TOPICAL_CREAM | CUTANEOUS | Status: DC | PRN

## 2022-12-05 MED ORDER — ISOSORBIDE MONONITRATE ER 30 MG PO TB24
60.0000 mg | ORAL_TABLET | Freq: Every day | ORAL | Status: DC
  Filled 2022-12-05: qty 1

## 2022-12-05 MED ORDER — HEPARIN SODIUM (PORCINE) 1000 UNIT/ML DIALYSIS: 1000.0000 [IU] | INTRAMUSCULAR | Status: DC | PRN

## 2022-12-05 MED ORDER — CALCIUM ACETATE (PHOS BINDER) 667 MG PO CAPS: 2001.0000 mg | ORAL_CAPSULE | Freq: Three times a day (TID) | ORAL | Status: DC

## 2022-12-05 MED ORDER — PANTOPRAZOLE SODIUM 40 MG PO TBEC: 40.0000 mg | DELAYED_RELEASE_TABLET | Freq: Every day | ORAL | Status: DC

## 2022-12-05 MED ORDER — SIMETHICONE 80 MG PO CHEW: 80.0000 mg | CHEWABLE_TABLET | Freq: Four times a day (QID) | ORAL | Status: DC

## 2022-12-05 MED ORDER — HEPARIN SODIUM (PORCINE) 5000 UNIT/ML IJ SOLN
5000.0000 [IU] | Freq: Three times a day (TID) | INTRAMUSCULAR | Status: DC
  Filled 2022-12-05: qty 1

## 2022-12-05 MED ORDER — MELATONIN 3 MG PO TABS: 3.0000 mg | ORAL_TABLET | Freq: Every evening | ORAL | Status: DC | PRN

## 2022-12-05 MED ORDER — ALBUTEROL SULFATE (2.5 MG/3ML) 0.083% IN NEBU: 2.5000 mg | INHALATION_SOLUTION | RESPIRATORY_TRACT | Status: DC | PRN

## 2022-12-05 NOTE — ED Provider Notes (Signed)
[place] EMERGENCY DEPARTMENT AT [facility] Provider Note   CSN: [id number] Arrival date & time: 12/04/22  1448     History  Chief Complaint  Patient presents with   Weakness    [name] is a 53 y.o. male.  The history is provided by the patient.  Weakness Severity:  Severe Onset quality:  Gradual Timing:  Constant Progression:  Worsening Chronicity:  Recurrent Context: not recent infection and not stress   Relieved by:  Nothing Worsened by:  Nothing Ineffective treatments:  None tried Associated symptoms: shortness of breath   Associated symptoms: no fever, no syncope, no urgency, no vision change and no vomiting   Risk factors: no new medications   Patient with ESRD who has missed dialysis and is SOB and weakness.  No fevers.  Was unable to be accommodated at dialysis.      Home Medications Prior to Admission medications   Medication Sig Start Date End Date Taking? Authorizing Provider  acetaminophen (TYLENOL) 500 MG tablet Take 1 tablet (500 mg total) by mouth every 8 (eight) hours as needed for mild pain. 05/28/22   Leroy Sea, MD  ASPIRIN LOW DOSE 81 MG tablet Take 1 tablet (81 mg total) by mouth daily. 05/28/22   Leroy Sea, MD  benzonatate (TESSALON) 100 MG capsule Take 1 capsule (100 mg total) by mouth every 8 (eight) hours. 09/09/22   Sabas Sous, MD  calcitRIOL (ROCALTROL) 0.25 MCG capsule Take 1 capsule (0.25 mcg total) by mouth every Tuesday, Thursday, and Saturday at 6 PM. 10/26/22   Ghimire, Werner Lean, MD  calcium acetate (PHOSLO) 667 MG capsule Take 3 capsules (2,001 mg total) by mouth 3 (three) times daily. 05/28/22   Leroy Sea, MD  carvedilol (COREG) 25 MG tablet Take 1 tablet (25 mg total) by mouth 2 (two) times daily with a meal. 10/26/22   Ghimire, Werner Lean, MD  glucose blood test strip Use to check fasting blood sugar once daily. 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 10/26/22   [provider]  insulin aspart (NOVOLOG FLEXPEN) 100 UNIT/ML FlexPen 0-9 Units, Subcutaneous, 3 times daily with meals CBG < 70: Implement Hypoglycemia measures CBG 70 - 120: 0 units CBG 121 - 150: 1 unit CBG 151 - 200: 2 units CBG 201 - 250: 3 units CBG 251 - 300: 5 units CBG 301 - 350: 7 units CBG 351 - 400: 9 units CBG > 400: call MD 10/26/22   Maretta Bees, MD  insulin glargine-yfgn (SEMGLEE) 100 UNIT/ML injection Inject 0.07 mLs (7 Units total) into the skin at bedtime. 10/26/22   Ghimire, Werner Lean, MD  Insulin Pen Needle 32G X 4 MM MISC Use with insulin pens 05/28/22   Leroy Sea, MD  isosorbide mononitrate (IMDUR) 60 MG 24 hr tablet Take 1 tablet (60 mg total) by mouth daily. 10/26/22   Ghimire, Werner Lean, MD  melatonin 3 MG TABS tablet Take 1 tablet (3 mg total) by mouth at bedtime as needed (insomnia). 10/26/22   Ghimire, Werner Lean, MD  oxyCODONE-acetaminophen (PERCOCET/ROXICET) 5-325 MG tablet Take 1 tablet by mouth every 12 (twelve) hours as needed for severe pain. 10/26/22   Ghimire, Werner Lean, MD  PROTONIX 40 MG tablet Take 1 tablet (40 mg total) by mouth daily. 05/25/22   Leroy Sea, MD  simethicone (MYLICON) 80 MG chewable tablet  Chew 1 tablet (80 mg total) by mouth 4 (four) times daily for 5 days. 11/22/22 12/17/22  Rodolph Bong, MD  gabapentin (NEURONTIN) 300 MG capsule Take 1 capsule (300 mg total) by mouth 3 (three) times daily. Patient not taking: Reported on 07/27/2019 07/29/18 08/30/19  Kallie Locks, FNP  sildenafil (VIAGRA) 25 MG tablet Take 1 tablet (25 mg total) by mouth as needed for erectile dysfunction. for erectile dysfunction. Patient not taking: Reported on 08/11/2020 10/13/19 11/27/20  Kallie Locks, FNP      Allergies    Sulfa antibiotics, Other, Bactrim [sulfamethoxazole-trimethoprim], and Cephalexin    Review of Systems   Review of Systems  Constitutional:  Negative for fever.   Respiratory:  Positive for shortness of breath.   Cardiovascular:  Negative for syncope.  Gastrointestinal:  Negative for vomiting.  Genitourinary:  Negative for urgency.  Neurological:  Positive for weakness.    Physical Exam Updated Vital Signs BP (!) 167/98   Pulse 80   Temp 98.7 F (37.1 C)   Resp 14   SpO2 96%  Physical Exam Vitals and nursing note reviewed.  Constitutional:      General: He is not in acute distress.    Appearance: He is well-developed. He is not diaphoretic.  HENT:     Head: Normocephalic and atraumatic.     Nose: Nose normal.  Eyes:     Conjunctiva/sclera: Conjunctivae normal.     Pupils: Pupils are equal, round, and reactive to light.  Cardiovascular:     Rate and Rhythm: Normal rate and regular rhythm.  Pulmonary:     Effort: Pulmonary effort is normal.     Breath sounds: Rhonchi and rales present. No wheezing.  Abdominal:     General: Bowel sounds are normal.     Palpations: Abdomen is soft.     Tenderness: There is no abdominal tenderness. There is no guarding or rebound.  Musculoskeletal:        General: Normal range of motion.     Cervical back: Normal range of motion and neck supple.  Skin:    General: Skin is warm and dry.     Capillary Refill: Capillary refill takes less than 2 seconds.  Neurological:     General: No focal deficit present.     Mental Status: He is alert and oriented to person, place, and time.     Deep Tendon Reflexes: Reflexes normal.  Psychiatric:        Mood and Affect: Mood normal.        Behavior: Behavior normal.     ED Results / Procedures / Treatments   Labs (all labs ordered are listed, but only abnormal results are displayed) Results for orders placed or performed during the hospital encounter of 12/04/22  CBC with Differential  Result Value Ref Range   WBC 5.0 4.0 - 10.5 K/uL   RBC 4.64 4.22 - 5.81 MIL/uL   Hemoglobin 11.8 (L) 13.0 - 17.0 g/dL   HCT 16.1 09.6 - 04.5 %   MCV 84.7 80.0 - 100.0 fL    MCH 25.4 (L) 26.0 - 34.0 pg   MCHC 30.0 30.0 - 36.0 g/dL   RDW 40.9 (H) 81.1 - 91.4 %   Platelets 315 150 - 400 K/uL   nRBC 0.0 0.0 - 0.2 %   Neutrophils Relative % 65 %   Neutro Abs 3.3 1.7 - 7.7 K/uL   Lymphocytes Relative 16 %   Lymphs Abs 0.8 0.7 - 4.0 K/uL  Monocytes Relative 13 %   Monocytes Absolute 0.6 0.1 - 1.0 K/uL   Eosinophils Relative 6 %   Eosinophils Absolute 0.3 0.0 - 0.5 K/uL   Basophils Relative 0 %   Basophils Absolute 0.0 0.0 - 0.1 K/uL   Immature Granulocytes 0 %   Abs Immature Granulocytes 0.01 0.00 - 0.07 K/uL  Basic metabolic panel  Result Value Ref Range   Sodium 137 135 - 145 mmol/L   Potassium 4.5 3.5 - 5.1 mmol/L   Chloride 97 (L) 98 - 111 mmol/L   CO2 24 22 - 32 mmol/L   Glucose, Bld 268 (H) 70 - 99 mg/dL   BUN 54 (H) 6 - 20 mg/dL   Creatinine, Ser 5.78 (H) 0.61 - 1.24 mg/dL   Calcium 7.8 (L) 8.9 - 10.3 mg/dL   GFR, Estimated 7 (L) >60 mL/min   Anion gap 16 (H) 5 - 15  Hepatic function panel  Result Value Ref Range   Total Protein 8.3 (H) 6.5 - 8.1 g/dL   Albumin 3.5 3.5 - 5.0 g/dL   AST 51 (H) 15 - 41 U/L   ALT 31 0 - 44 U/L   Alkaline Phosphatase 263 (H) 38 - 126 U/L   Total Bilirubin 0.9 0.3 - 1.2 mg/dL   Bilirubin, Direct 0.4 (H) 0.0 - 0.2 mg/dL   Indirect Bilirubin 0.5 0.3 - 0.9 mg/dL  Brain natriuretic peptide  Result Value Ref Range   B Natriuretic Peptide >4,500.0 (H) 0.0 - 100.0 pg/mL  Troponin I (High Sensitivity)  Result Value Ref Range   Troponin I (High Sensitivity) 108 (HH) <18 ng/L  Troponin I (High Sensitivity)  Result Value Ref Range   Troponin I (High Sensitivity) 114 (HH) <18 ng/L   DG Chest Portable 1 View  Result Date: 12/05/2022 CLINICAL DATA:  Shortness of breath EXAM: PORTABLE CHEST 1 VIEW COMPARISON:  Chest radiograph 11/30/2022 FINDINGS: Stable cardiomegaly. Pulmonary vascular congestion. Hazy interstitial opacities in the mid and lower lungs bilaterally. No definite pleural effusion. No pneumothorax.  Chronic fracture distal left clavicle. IMPRESSION: Cardiomegaly and pulmonary edema, slightly worse from 11/30/2022. Electronically Signed   By: Minerva Fester M.D.   On: 12/05/2022 00:38   DG Chest 2 View  Result Date: 11/30/2022 CLINICAL DATA:  Shortness of breath. EXAM: CHEST - 2 VIEW COMPARISON:  Chest radiograph dated October 26, 2022 FINDINGS: Marked cardiomegaly. Pulmonary vascular congestion. No evidence of pulmonary edema or pleural effusion. IMPRESSION: Marked cardiomegaly and pulmonary vascular congestion. Electronically Signed   By: Larose Hires D.O.   On: 11/30/2022 22:49   DG Chest 2 View  Result Date: 11/25/2022 CLINICAL DATA:  Shortness of breath. EXAM: CHEST - 2 VIEW COMPARISON:  Chest radiograph dated 11/23/2022. FINDINGS: There is cardiomegaly with vascular congestion. No focal consolidation, pleural effusion or pneumothorax. No acute osseous pathology. IMPRESSION: Cardiomegaly with vascular congestion. Electronically Signed   By: Elgie Collard M.D.   On: 11/25/2022 21:57   DG Chest 2 View  Result Date: 11/23/2022 CLINICAL DATA:  Shortness of breath EXAM: CHEST - 2 VIEW COMPARISON:  Chest x-ray 11/16/2022 FINDINGS: The heart is enlarged. There central pulmonary vascular congestion. There is no focal lung infiltrate, pleural effusion or pneumothorax. No acute fractures are seen. IMPRESSION: Cardiomegaly with central pulmonary vascular congestion. Electronically Signed   By: [name] M.D.   On: 11/23/2022 01:21   DG Chest Port 1 View  Result Date: 11/16/2022 CLINICAL DATA:  Shortness of breath EXAM: PORTABLE CHEST 1 VIEW COMPARISON:  11/12/2022 FINDINGS: Cardiac shadow remains enlarged. Central vascular congestion is again identified. Patchy airspace opacity is noted in the right upper lobe along the minor fissure as well as in the medial aspect of the right lower lobe. Left lung remains clear. IMPRESSION: Changes consistent with multifocal infiltrate on the right. Changes of mild  CHF. Electronically Signed   By: Alcide Clever M.D.   On: 11/16/2022 20:43    EKG  EKG Interpretation Date/Time:  Wednesday December 04 2022 23:57:34 EDT Ventricular Rate:  83 PR Interval:  176 QRS Duration:  154 QT Interval:  459 QTC Calculation: 540 R Axis:   19  Text Interpretation: Sinus rhythm Right atrial enlargement Left bundle branch block Confirmed by Nicanor Alcon, Zailey Audia ([id number]) on 12/05/2022 4:14:46 AM         Radiology DG Chest Portable 1 View  Result Date: 12/05/2022 CLINICAL DATA:  Shortness of breath EXAM: PORTABLE CHEST 1 VIEW COMPARISON:  Chest radiograph 11/30/2022 FINDINGS: Stable cardiomegaly. Pulmonary vascular congestion. Hazy interstitial opacities in the mid and lower lungs bilaterally. No definite pleural effusion. No pneumothorax. Chronic fracture distal left clavicle. IMPRESSION: Cardiomegaly and pulmonary edema, slightly worse from 11/30/2022. Electronically Signed   By: Minerva Fester M.D.   On: 12/05/2022 00:38    Procedures Procedures    Medications Ordered in ED Medications  Chlorhexidine Gluconate Cloth 2 % PADS 6 each (has no administration in time range)    ED Course/ Medical Decision Making/ A&P                             Medical Decision Making Patient with ESRD with missed dialysis   Amount and/or Complexity of Data Reviewed External Data Reviewed: labs, radiology and notes.    Details: Previous ED and admission reviewed with labs and CXR Labs: ordered.    Details: BNP > 4500 markedly elevated elevated AST 51, elevated troponins 108 and 114. White count normal 5, low hemoglobin 11.8, normal platelet count. Normal sodium 137, normal potassium 3.5, elevated creatinine consistent with renal failure 8.89  Radiology: ordered and independent interpretation performed.    Details: Pulmonary edema by me  ECG/medicine tests: ordered and independent interpretation performed. Decision-making details documented in ED Course. Discussion of management or  test interpretation with external provider(s): Case d/w Dr. Melanee Spry of nephrology will be dialysed during day shift Dr. Janalyn Shy to admit   Risk Decision regarding hospitalization.    Final Clinical Impression(s) / ED Diagnoses Final diagnoses:  ESRD (end stage renal disease) (HCC)   The patient appears reasonably stabilized for admission considering the current resources, flow, and capabilities available in the ED at this time, and I doubt any other [facility] requiring further screening and/or treatment in the ED prior to admission.  Rx / DC Orders ED Discharge Orders     None         Diesel Lina, MD 12/05/22 [phone]

## 2022-12-05 NOTE — H&P (Addendum)
History and Physical    [name]:[id number] DOB: 03/24/1971 DOA: 12/04/2022  PCP: [name], [facility]  Patient coming from: home  I have personally briefly reviewed patient's old medical records in [facility]  Chief Complaint:  missed HD with weakness and sob   HPI: [name] is a 52 y.o. male with medical history significant of Anxiety, DMII, hypertension, ESRD on HD TTS, chronic HFrEF 20 to 25%, Anemia of chronic disease, GERD,hx of MRSA bacteremia, homelessness,interim history of hospitalization 6/29-7/25 for following diagnoses Acute hypoxic respiratory failure due to CAP for which patient was treated with vance/cefepime and then transitioned to Levaquin. Of note patient patient was able to be weaned of O2 and was discharged without home O2 with stat 98% on ra. Patient  now returns to ED sent in from Out patient dialysis as he presented to center attempting to finish is HD that he did not complete yesterday. Patient per chart is a difficult patient and has history of non-compliance with medical treatment and leaving AMA.   Patient taken to HD and on return to ED signed out AMA prior to full evaluation despite efforts to convince patient to stay.  ED Course:  On evaluation patient noted to have fluid overload. Patient was discussed with Dr Valentino Nose from nephrology and is undergoing HD currently.   Vitals: afeb, bp 159/110-238/116, hr 81, rr 15, H!81 sat 84%-91 on 2L   sinus  RAE, LBBB  Labs  Wbc : 5.6, hgb 11.5, plt326,  RDW 17.2 Na 138, K 4.4, CL 96, Glu 133, Cr 8.21 CA .8, AG 18 -16 AST-51, ALT 31, Alkphos 263 (chronic elevation) BNP:>4500 chronic\ CE 108,114 chronic insetting of ESRD EKG: Cxr: IMPRESSION: Cardiomegaly and pulmonary edema, slightly worse from 11/30/2022.  Review of Systems: unable to obtain  Past Medical History:  Diagnosis Date   Anxiety 02/2019   Anxiety 10/2019   Diabetes mellitus type II, uncontrolled 07/17/2006   Elevated alkaline  phosphatase level 01/2019   ESSENTIAL HYPERTENSION 07/17/2006   Homelessness 10/13/2021   HYPERLIPIDEMIA 11/24/2008   Insomnia 02/2019   Left bundle branch block 02/22/2019   Lesion of lip 10/2019   Pneumonia 10/22/2011   Renal disorder    Suicidal ideations 02/2019   Vitamin D deficiency 01/2019    Past Surgical History:  Procedure Laterality Date   ARM SURGERY     AV FISTULA PLACEMENT Right 02/20/2021   Procedure: RIGHT ARTERIOVENOUS GRAFT CREATION;  Surgeon: Chuck Hint, MD;  Location: [facility] OR;  Service: Vascular;  Laterality: Right;   INSERTION OF DIALYSIS CATHETER Left 02/20/2021   Procedure: INSERTION OF DIALYSIS CATHETER USING PALINDROME CATHETER;  Surgeon: Chuck Hint, MD;  Location: [facility] OR;  Service: Vascular;  Laterality: Left;   IR AV DIALY SHUNT INTRO NEEDLE/INTRACATH INITIAL W/PTA/IMG RIGHT Right 11/06/2021   IR FLUORO GUIDE CV LINE RIGHT  09/12/2022   IR REMOVAL TUN CV CATH W/O FL  07/27/2021   IR THROMBECTOMY AV FISTULA W/THROMBOLYSIS INC/SHUNT/IMG RIGHT  09/11/2022   IR US GUIDE VASC ACCESS RIGHT  11/06/2021   IR US GUIDE VASC ACCESS RIGHT  09/11/2022   IR US GUIDE VASC ACCESS RIGHT  09/12/2022   TEE WITHOUT CARDIOVERSION N/A 10/25/2022   Procedure: TRANSESOPHAGEAL ECHOCARDIOGRAM;  Surgeon: Chilton Si, MD;  Location: [facility] INVASIVE CV LAB;  Service: Cardiovascular;  Laterality: N/A;   ULTRASOUND GUIDANCE FOR VASCULAR ACCESS  02/20/2021   Procedure: ULTRASOUND GUIDANCE FOR VASCULAR ACCESS;  Surgeon: Chuck Hint, MD;  Location: [facility]  OR;  Service: Vascular;;     reports that he has never smoked. He has never used smokeless tobacco. He reports that he does not drink alcohol and does not use drugs.  Allergies  Allergen Reactions   Sulfa Antibiotics Rash    Severe rash.  Severe rash.     Severe rash.   Other Other (See Comments)    Patient states he is allergic to the TB test - unknown reaction during childhood    Bactrim  [Sulfamethoxazole-Trimethoprim] Rash   Cephalexin Other (See Comments) and Rash    Patient with severe drug reaction including exfoliating skin rash and hypotension after being prescribed both cephalexin and sulfamethoxazole-trimethoprim simultaneously. Favor SMX as much more likely culprit as patient had tolerated other cephalosporins in the past, but cannot say with complete certainty that this was not related to cephalexin.  Other Reaction(s): Other, Other (See Comments)  Patient with severe drug reaction including exfoliating skin rash and hypotension after being prescribed both cephalexin and sulfamethoxazole-trimethoprim simultaneously. Favor SMX as much more likely culprit as patient had tolerated other cephalosporins in the past, but cannot say with complete certainty that this was not related to cephalexin.   "Patient with severe drug reaction including exfoliating skin rash and hypotension after being prescribed both cephalexin and sulfamethoxazole-trimethoprim simultaneously. Favor SMX as much more likely culprit as patient had tolerated other cephalosporins in the past, but cannot say with complete certainty that this was not related to cephalexin." As noted in [facility]   "Patient with severe drug reaction including exfoliating skin rash and hypotension after being prescribed both cephalexin and sulfamethoxazole-trimethoprim simultaneously. Favor SMX as much more likely culprit as patient had tolerated other cephalosporins in the past, but cannot say with complete certainty that this was not related to cephalexin." As noted in [facility]   "Patient with severe drug reaction including exfoliating skin rash and hypotension after being prescribed both cephalexin and sulfamethoxazole-trimethoprim simultaneously. Favor SMX as much more likely culprit as patient had tolerated other cephalosporins in the past, but cannot say with complete certainty that this was not related to cephalexin." As  noted in [facility]     Patient with severe drug reaction including exfoliating skin rash and hypotension after being prescribed both cephalexin and sulfamethoxazole-trimethoprim simultaneously. Favor SMX as much more likely culprit as patient had tolerated other cephalosporins in the past, but cannot say with complete certainty that this was not related to cephalexin.  "Patient with severe drug reaction including exfoliating skin rash and hypotension after being prescribed both cephalexin and sulfamethoxazole-trimethoprim simultaneously. Favor SMX as much more likely culprit as patient had tolerated other cephalosporins in the past, but cannot say with complete certainty that this was not related to cephalexin." As noted in [facility]  "Patient with severe drug reaction including exfoliating skin rash and hypotension after being prescribed both cephalexin and sulfamethoxazole-trimethoprim simultaneously. Favor SMX as much more likely culprit as patient had tolerated other cephalosporins in the past, but cannot say with complete certainty that this was not related to cephalexin." As noted in [facility]     Patient with severe drug reaction inc... (TRUNCATED)    Family History  Problem Relation Age of Onset   Hypertension Mother 63   Cancer Mother        bladder cancer   Alcohol abuse Father    Hypertension Brother     Prior to Admission medications   Medication Sig Start Date End Date Taking? Authorizing Provider  acetaminophen (TYLENOL)  500 MG tablet Take 1 tablet (500 mg total) by mouth every 8 (eight) hours as needed for mild pain. 05/28/22   Leroy Sea, MD  ASPIRIN LOW DOSE 81 MG tablet Take 1 tablet (81 mg total) by mouth daily. 05/28/22   Leroy Sea, MD  benzonatate (TESSALON) 100 MG capsule Take 1 capsule (100 mg total) by mouth every 8 (eight) hours. 09/09/22   Sabas Sous, MD  calcitRIOL (ROCALTROL) 0.25 MCG capsule Take 1 capsule (0.25 mcg total) by mouth every Tuesday,  Thursday, and Saturday at 6 PM. 10/26/22   Ghimire, Werner Lean, MD  calcium acetate (PHOSLO) 667 MG capsule Take 3 capsules (2,001 mg total) by mouth 3 (three) times daily. 05/28/22   Leroy Sea, MD  carvedilol (COREG) 25 MG tablet Take 1 tablet (25 mg total) by mouth 2 (two) times daily with a meal. 10/26/22   Ghimire, Werner Lean, MD  glucose blood test strip Use to check fasting blood sugar once daily. 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 10/26/22   [provider]  insulin aspart (NOVOLOG FLEXPEN) 100 UNIT/ML FlexPen 0-9 Units, Subcutaneous, 3 times daily with meals CBG < 70: Implement Hypoglycemia measures CBG 70 - 120: 0 units CBG 121 - 150: 1 unit CBG 151 - 200: 2 units CBG 201 - 250: 3 units CBG 251 - 300: 5 units CBG 301 - 350: 7 units CBG 351 - 400: 9 units CBG > 400: call MD 10/26/22   Maretta Bees, MD  insulin glargine-yfgn (SEMGLEE) 100 UNIT/ML injection Inject 0.07 mLs (7 Units total) into the skin at bedtime. 10/26/22   Ghimire, Werner Lean, MD  Insulin Pen Needle 32G X 4 MM MISC Use with insulin pens 05/28/22   Leroy Sea, MD  isosorbide mononitrate (IMDUR) 60 MG 24 hr tablet Take 1 tablet (60 mg total) by mouth daily. 10/26/22   Ghimire, Werner Lean, MD  melatonin 3 MG TABS tablet Take 1 tablet (3 mg total) by mouth at bedtime as needed (insomnia). 10/26/22   Ghimire, Werner Lean, MD  oxyCODONE-acetaminophen (PERCOCET/ROXICET) 5-325 MG tablet Take 1 tablet by mouth every 12 (twelve) hours as needed for severe pain. 10/26/22   Ghimire, Werner Lean, MD  PROTONIX 40 MG tablet Take 1 tablet (40 mg total) by mouth daily. 05/25/22   Leroy Sea, MD  simethicone (MYLICON) 80 MG chewable tablet Chew 1 tablet (80 mg total) by mouth 4 (four) times daily for 5 days. 11/22/22 12/17/22  Rodolph Bong, MD  gabapentin (NEURONTIN) 300 MG capsule Take 1 capsule (300 mg total) by mouth 3 (three) times  daily. Patient not taking: Reported on 07/27/2019 07/29/18 08/30/19  Kallie Locks, FNP  sildenafil (VIAGRA) 25 MG tablet Take 1 tablet (25 mg total) by mouth as needed for erectile dysfunction. for erectile dysfunction. Patient not taking: Reported on 08/11/2020 10/13/19 11/27/20  Kallie Locks, FNP    Physical Exam: Vitals:   12/05/22 0645 12/05/22 0725 12/05/22 0748 12/05/22 0830  BP: (!) 176/104 (!) 217/116 (!) 211/112 (!) 191/103  Pulse: 82 75 73 74  Resp: 14 (!) 8 12 10   Temp:  (!) 97.4 F (36.3 C)    TempSrc:      SpO2: (!) 83% 94% 98% 97%  Weight:  71.4 kg       Vitals:   12/05/22 0645 12/05/22 0725 12/05/22 0748 12/05/22 0830  BP: (!) 176/104 (!) 217/116 (!) 211/112 (!) 191/103  Pulse: 82 75 73 74  Resp: 14 (!) 8 12 10   Temp:  (!) 97.4 F (36.3 C)    TempSrc:      SpO2: (!) 83% 94% 98% 97%  Weight:  71.4 kg     PE: patient signed out ama prior to being seen   Labs on Admission: I have personally reviewed following labs and imaging studies  CBC: Recent Labs  Lab 11/30/22 2215 12/04/22 0446 12/04/22 1518  WBC 6.8 5.6 5.0  NEUTROABS 4.2 3.7 3.3  HGB 11.0* 11.5* 11.8*  HCT 38.4* 38.9* 39.3  MCV 93.4 85.9 84.7  PLT 308 326 315   Basic Metabolic Panel: Recent Labs  Lab 11/30/22 2215 12/04/22 0446 12/04/22 1518  NA 138 138 137  K 4.3 4.4 4.5  CL 96* 96* 97*  CO2 23 24 24   GLUCOSE 174* 133* 268*  BUN 48* 52* 54*  CREATININE 7.47* 8.21* 8.89*  CALCIUM 8.3* 8.0* 7.8*   GFR: Estimated Creatinine Clearance: 9.4 mL/min (A) (by C-G formula based on SCr of 8.89 mg/dL (H)). Liver Function Tests: Recent Labs  Lab 12/05/22 0004  AST 51*  ALT 31  ALKPHOS 263*  BILITOT 0.9  PROT 8.3*  ALBUMIN 3.5   No results for input(s): "LIPASE", "AMYLASE" in the last 168 hours. No results for input(s): "AMMONIA" in the last 168 hours. Coagulation Profile: No results for input(s): "INR", "PROTIME" in the last 168 hours. Cardiac Enzymes: No results for  input(s): "CKTOTAL", "CKMB", "CKMBINDEX", "TROPONINI" in the last 168 hours. BNP (last 3 results) No results for input(s): "PROBNP" in the last 8760 hours. HbA1C: No results for input(s): "HGBA1C" in the last 72 hours. CBG: No results for input(s): "GLUCAP" in the last 168 hours. Lipid Profile: No results for input(s): "CHOL", "HDL", "LDLCALC", "TRIG", "CHOLHDL", "LDLDIRECT" in the last 72 hours. Thyroid Function Tests: No results for input(s): "TSH", "T4TOTAL", "FREET4", "T3FREE", "THYROIDAB" in the last 72 hours. Anemia Panel: No results for input(s): "VITAMINB12", "FOLATE", "FERRITIN", "TIBC", "IRON", "RETICCTPCT" in the last 72 hours. Urine analysis:    Component Value Date/Time   COLORURINE YELLOW 02/19/2021 0045   APPEARANCEUR HAZY (A) 02/19/2021 0045   APPEARANCEUR Clear 05/09/2020 1509   LABSPEC 1.013 02/19/2021 0045   PHURINE 5.0 02/19/2021 0045   GLUCOSEU >=500 (A) 02/19/2021 0045   HGBUR MODERATE (A) 02/19/2021 0045   BILIRUBINUR NEGATIVE 02/19/2021 0045   BILIRUBINUR Negative 05/09/2020 1509   KETONESUR NEGATIVE 02/19/2021 0045   PROTEINUR >=300 (A) 02/19/2021 0045   UROBILINOGEN 0.2 02/04/2020 1612   UROBILINOGEN 0.2 02/16/2014 0045   NITRITE NEGATIVE 02/19/2021 0045   LEUKOCYTESUR NEGATIVE 02/19/2021 0045    Radiological Exams on Admission: DG Chest Portable 1 View  Result Date: 12/05/2022 CLINICAL DATA:  Shortness of breath EXAM: PORTABLE CHEST 1 VIEW COMPARISON:  Chest radiograph 11/30/2022 FINDINGS: Stable cardiomegaly. Pulmonary vascular congestion. Hazy interstitial opacities in the mid and lower lungs bilaterally. No definite pleural effusion. No pneumothorax. Chronic fracture distal left clavicle. IMPRESSION: Cardiomegaly and pulmonary edema, slightly worse from 11/30/2022. Electronically Signed   By: Minerva Fester M.D.   On: 12/05/2022 00:38    EKG: Independently reviewed.   Assessment/Plan  Acute exacerbation of combined diastolic /systolic Heart  failure in setting of noncompliance with HD with acute hypoxic respiratory failure - admitted to progressive care  - fluid management completed with HD -appears to have been able to be weaned of O2  -per renal rec was  for f/u with potential repeat HD in am  -patient however signed out ama   Hypertensive Urgency  -in setting of fluid overload  -patient still with elevated blood pressure after HD  - unable to treat as patient refusing to stay for treatment  ESRD on HD TTS Secondary Hyperparathyroidism -would encourage patient to resume outpatient follow up with HD center   Recent Hx of MRSA bacteremia Recent Hx of CAP  -s/p treatment 7/6 -no concern for recurrence at this time   DMII Anxiety  Anemia of chronic disease GERD  Patient should f/u with pcp as soon as possible or return to ED to complete care      Lurline Del MD [facility]   If 7PM-7AM, please contact night-coverage [email or web address] Password [facility]  12/05/2022, 8:50 AM

## 2022-12-05 NOTE — Progress Notes (Signed)
Contacted [facility] to advise clinic that pt was in ED but then left AMA.   Olivia Canter Renal Navigator [phone]

## 2022-12-05 NOTE — ED Notes (Signed)
Pt adamant about leaving, says he needs to be out of here by 12;30. Pt signed AMA form, Maisie Fus, MD, notified.

## 2022-12-05 NOTE — Progress Notes (Signed)
Heart Failure Navigator Progress Note  Assessed for Heart & Vascular TOC clinic readiness.  Patient does not meet criteria due to ESRD on Hemodialysis.   Navigator will sign off at this time.   Rhae Hammock, BSN, Scientist, clinical (histocompatibility and immunogenetics) Only

## 2022-12-05 NOTE — ED Notes (Signed)
Patient refused x-ray, MD notified. Patient informed MD will have very limited treatment options without a current x-ray. Patient changed his mind and agreed to have x-ray done.

## 2022-12-05 NOTE — Discharge Summary (Signed)
Physician Discharge Summary   Patient: [name] MRN: [id number] DOB: Jun 11, 1970  Admit date:     12/04/2022  Discharge date: 12/05/22  Discharge Physician: Lurline Del   PCP: [name], [facility]   Recommendations at discharge:    Follow with pcp /HD center / return to ED to complete care   Discharge Diagnoses: Principal Problem:   Acute CHF (congestive heart failure) (HCC)  Resolved problems -patient signed out am   Hospital Course: HPI: [name] is a 52 y.o. male with medical history significant of Anxiety, DMII, hypertension, ESRD on HD TTS, chronic HFrEF 20 to 25%, Anemia of chronic disease, GERD,hx of MRSA bacteremia, homelessness,interim history of hospitalization 6/29-7/25 for following diagnoses Acute hypoxic respiratory failure due to CAP for which patient was treated with vance/cefepime and then transitioned to Levaquin. Of note patient patient was able to be weaned of O2 and was discharged without home O2 with stat 98% on ra. Patient  now returns to ED sent in from Out patient dialysis as he presented to center attempting to finish is HD that he did not complete yesterday. Patient per chart is a difficult patient and has history of non-compliance with medical treatment and leaving AMA.   Patient taken to HD and on return to ED signed out AMA prior to full evaluation despite efforts to convince patient to stay.   ED Course:  On evaluation patient noted to have fluid overload. Patient was discussed with Dr Valentino Nose from nephrology and is undergoing HD currently.    Vitals: afeb, bp 159/110-238/116, hr 81, rr 15, H!81 sat 84%-91 on 2L    sinus  RAE, LBBB   Labs  Wbc : 5.6, hgb 11.5, plt326,  RDW 17.2 Na 138, K 4.4, CL 96, Glu 133, Cr 8.21 CA .8, AG 18 -16 AST-51, ALT 31, Alkphos 263 (chronic elevation) BNP:>4500 chronic\ CE 108,114 chronic insetting of ESRD EKG: Cxr: IMPRESSION: Cardiomegaly and pulmonary edema, slightly worse from 11/30/2022.  Patient  however after HD note to still have uncontrolled blood pressure and not at goal weight. Plan were made for admission for possible further HD in the am and continued treatment of elevated blood pressure. However patient decided to sign out AMA    Consultants: Dr Valentino Nose Procedures performed: HD Disposition: AMA Discharge Diet recommendation:  Renal diet DISCHARGE MEDICATION:   Follow-up Information     [name], [facility].                 Discharge Exam: Filed Weights   12/05/22 0725  Weight: 71.4 kg     Condition at discharge:  unchanged still requires hospitalization but has signed out ama  The results of significant diagnostics from this hospitalization (including imaging, microbiology, ancillary and laboratory) are listed below for reference.   Imaging Studies: DG Chest Portable 1 View  Result Date: 12/05/2022 CLINICAL DATA:  Shortness of breath EXAM: PORTABLE CHEST 1 VIEW COMPARISON:  Chest radiograph 11/30/2022 FINDINGS: Stable cardiomegaly. Pulmonary vascular congestion. Hazy interstitial opacities in the mid and lower lungs bilaterally. No definite pleural effusion. No pneumothorax. Chronic fracture distal left clavicle. IMPRESSION: Cardiomegaly and pulmonary edema, slightly worse from 11/30/2022. Electronically Signed   By: Minerva Fester M.D.   On: 12/05/2022 00:38   DG Chest 2 View  Result Date: 11/30/2022 CLINICAL DATA:  Shortness of breath. EXAM: CHEST - 2 VIEW COMPARISON:  Chest radiograph dated October 26, 2022 FINDINGS: Marked cardiomegaly. Pulmonary vascular congestion. No evidence of pulmonary edema or pleural effusion. IMPRESSION: Marked  cardiomegaly and pulmonary vascular congestion. Electronically Signed   By: Larose Hires D.O.   On: 11/30/2022 22:49   DG Chest 2 View  Result Date: 11/25/2022 CLINICAL DATA:  Shortness of breath. EXAM: CHEST - 2 VIEW COMPARISON:  Chest radiograph dated 11/23/2022. FINDINGS: There is cardiomegaly with vascular congestion. No focal  consolidation, pleural effusion or pneumothorax. No acute osseous pathology. IMPRESSION: Cardiomegaly with vascular congestion. Electronically Signed   By: Elgie Collard M.D.   On: 11/25/2022 21:57   DG Chest 2 View  Result Date: 11/23/2022 CLINICAL DATA:  Shortness of breath EXAM: CHEST - 2 VIEW COMPARISON:  Chest x-ray 11/16/2022 FINDINGS: The heart is enlarged. There central pulmonary vascular congestion. There is no focal lung infiltrate, pleural effusion or pneumothorax. No acute fractures are seen. IMPRESSION: Cardiomegaly with central pulmonary vascular congestion. Electronically Signed   By: [name] M.D.   On: 11/23/2022 01:21   DG Chest Port 1 View  Result Date: 11/16/2022 CLINICAL DATA:  Shortness of breath EXAM: PORTABLE CHEST 1 VIEW COMPARISON:  11/12/2022 FINDINGS: Cardiac shadow remains enlarged. Central vascular congestion is again identified. Patchy airspace opacity is noted in the right upper lobe along the minor fissure as well as in the medial aspect of the right lower lobe. Left lung remains clear. IMPRESSION: Changes consistent with multifocal infiltrate on the right. Changes of mild CHF. Electronically Signed   By: Alcide Clever M.D.   On: 11/16/2022 20:43    Microbiology: Results for orders placed or performed during the hospital encounter of 11/16/22  Culture, blood (routine x 2)     Status: None   Collection Time: 11/16/22  8:50 PM   Specimen: BLOOD  Result Value Ref Range Status   Specimen Description BLOOD RIGHT ANTECUBITAL  Final   Special Requests   Final    BOTTLES DRAWN AEROBIC AND ANAEROBIC Blood Culture adequate volume   Culture   Final    NO GROWTH 5 DAYS Performed at [facility], [address], [place], [place] [address]    Report Status 11/21/2022 FINAL  Final  Culture, blood (routine x 2)     Status: None   Collection Time: 11/16/22  8:55 PM   Specimen: BLOOD  Result Value Ref Range Status   Specimen Description BLOOD SITE NOT SPECIFIED   Final   Special Requests   Final    BOTTLES DRAWN AEROBIC AND ANAEROBIC Blood Culture adequate volume   Culture   Final    NO GROWTH 5 DAYS Performed at [facility], [address] [place], [place] [address]    Report Status 11/21/2022 FINAL  Final  SARS Coronavirus 2 by RT PCR (hospital order, performed in [facility] hospital lab) *cepheid single result test* Anterior Nasal Swab     Status: None   Collection Time: 11/17/22 11:23 AM   Specimen: Anterior Nasal Swab  Result Value Ref Range Status   SARS Coronavirus 2 by RT PCR NEGATIVE NEGATIVE Final    Comment: Performed at [facility], [address] [place], [place] [address]    Labs: CBC: Recent Labs  Lab 11/30/22 2215 12/04/22 0446 12/04/22 1518  WBC 6.8 5.6 5.0  NEUTROABS 4.2 3.7 3.3  HGB 11.0* 11.5* 11.8*  HCT 38.4* 38.9* 39.3  MCV 93.4 85.9 84.7  PLT 308 326 315   Basic Metabolic Panel: Recent Labs  Lab 11/30/22 2215 12/04/22 0446 12/04/22 1518  NA 138 138 137  K 4.3 4.4 4.5  CL  96* 96* 97*  CO2 23 24 24   GLUCOSE 174* 133* 268*  BUN 48* 52* 54*  CREATININE 7.47* 8.21* 8.89*  CALCIUM 8.3* 8.0* 7.8*   Liver Function Tests: Recent Labs  Lab 12/05/22 0004  AST 51*  ALT 31  ALKPHOS 263*  BILITOT 0.9  PROT 8.3*  ALBUMIN 3.5   CBG: No results for input(s): "GLUCAP" in the last 168 hours.  Discharge time spent: less than 30 minutes.  Signed: Lurline Del, MD [facility] 12/05/2022

## 2022-12-05 NOTE — Progress Notes (Signed)
Received patient in bed to unit.  Alert and oriented.  Informed consent signed and in chart.   TX duration:1:32  Patient tolerated well.  Transported back to the room  Alert, without acute distress.  Hand-off given to patient's nurse.report given to charge nurse Meghan  Access used: rt LAG Access issues: none  Total UF removed: 1.1L Medication(s) given: n/a Post HD VS: 171/76, 18, 97.6,  Post HD weight: 72.4  Tx ended early per pt request, no s/s of distress to note, 1.5 hour treatment   [facility]

## 2022-12-05 NOTE — Consult Note (Signed)
ESRD Consult Note  Reason for consult: ESRD, provision of dialysis  Assessment/Recommendations:  ESRD -outpatient HD orders: [facility], TTS. EDW 61.5 kg.  4 hours.  2K/2.5 calcium.  Flow rates: 400/800.  RUE AVG.  15-gauge needles.  Heparin 2000 units bolus.  Meds: Mircera 200 mcg (last dose 7/11), Venofer 100 mg 3 times a week (has yet to be started), calcitriol 0.25 mcg 3 times weekly -noncompliant with HD (shortens treatment time, misses treatments) -HD today, reassess tomorrow if still here (may need serial HD for UF)  Acute on chronic HFrEF exacerbation, pulm edema -UF as tolerated today (provided he stays for his full treatment)  Volume/ hypertension  -has not been reaching EDW, UF as tolerated  Anemia of Chronic Kidney Disease -Hemoglobin hgb 11.8, not due for esa, monitor for now.  -Transfuse PRN for Hgb <7  Secondary Hyperparathyroidism/Hyperphosphatemia - renal diet, resume home binders if on any. Calcitriol resumed   Recommendations were discussed with the primary team. Outside records were reviewed in the care of this patient. Labs and imaging were independently interpreted to aid in clinical decision making. Findings and plan were conveyed to the primary team.  Anthony Sar, MD [facility]  History of Present Illness: [name] is a/an 52 y.o. male with a past medical history of ESRD, hypertension, DM, HFrEF who presents with shortness of breath and weakness. Has not had a full treatment since his last hospitalization here, for example has been running about an hour and 1/2 to 3 hours at his outpatient dialysis unit.  His last treatment was on Tuesday and he only ran for 2 hours.  Chest x-ray here showed pulm edema. Patient seen and examined on dialysis. Not really talking to me. Eating breakfast while on machine. Per staff, he reported that he's leaving at 10am.   Medications:  Current Facility-Administered Medications  Medication Dose Route Frequency  Provider Last Rate Last Admin   Chlorhexidine Gluconate Cloth 2 % PADS 6 each  6 each Topical Q0600 Darnell Level, MD       Current Outpatient Medications  Medication Sig Dispense Refill   acetaminophen (TYLENOL) 500 MG tablet Take 1 tablet (500 mg total) by mouth every 8 (eight) hours as needed for mild pain. 20 tablet 0   ASPIRIN LOW DOSE 81 MG tablet Take 1 tablet (81 mg total) by mouth daily. 30 tablet 0   benzonatate (TESSALON) 100 MG capsule Take 1 capsule (100 mg total) by mouth every 8 (eight) hours. 21 capsule 0   calcitRIOL (ROCALTROL) 0.25 MCG capsule Take 1 capsule (0.25 mcg total) by mouth every Tuesday, Thursday, and Saturday at 6 PM.     calcium acetate (PHOSLO) 667 MG capsule Take 3 capsules (2,001 mg total) by mouth 3 (three) times daily. 270 capsule 0   carvedilol (COREG) 25 MG tablet Take 1 tablet (25 mg total) by mouth 2 (two) times daily with a meal.     glucose blood test strip Use to check fasting blood sugar once daily. 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 Insulin dependent 100 each 12   HUMALOG KWIKPEN 100 UNIT/ML KwikPen Inject into the skin.     insulin aspart (NOVOLOG FLEXPEN) 100 UNIT/ML FlexPen 0-9 Units, Subcutaneous, 3 times daily with meals CBG < 70: Implement Hypoglycemia measures CBG 70 - 120: 0 units CBG 121 - 150: 1 unit CBG 151 - 200: 2 units CBG 201 - 250: 3 units CBG 251 - 300: 5 units CBG 301 - 350: 7 units CBG 351 -  400: 9 units CBG > 400: call MD 15 mL 0   insulin glargine-yfgn (SEMGLEE) 100 UNIT/ML injection Inject 0.07 mLs (7 Units total) into the skin at bedtime. 10 mL 11   Insulin Pen Needle 32G X 4 MM MISC Use with insulin pens 100 each 1   isosorbide mononitrate (IMDUR) 60 MG 24 hr tablet Take 1 tablet (60 mg total) by mouth daily.     melatonin 3 MG TABS tablet Take 1 tablet (3 mg total) by mouth at bedtime as needed (insomnia).  0   oxyCODONE-acetaminophen (PERCOCET/ROXICET) 5-325 MG tablet Take 1 tablet by mouth every 12 (twelve) hours as needed for severe pain.  15 tablet 0   PROTONIX 40 MG tablet Take 1 tablet (40 mg total) by mouth daily. 30 tablet 0   simethicone (MYLICON) 80 MG chewable tablet Chew 1 tablet (80 mg total) by mouth 4 (four) times daily for 5 days. 100 tablet 0     ALLERGIES Sulfa antibiotics, Other, Bactrim [sulfamethoxazole-trimethoprim], and Cephalexin  MEDICAL HISTORY Past Medical History:  Diagnosis Date   Anxiety 02/2019   Anxiety 10/2019   Diabetes mellitus type II, uncontrolled 07/17/2006   Elevated alkaline phosphatase level 01/2019   ESSENTIAL HYPERTENSION 07/17/2006   Homelessness 10/13/2021   HYPERLIPIDEMIA 11/24/2008   Insomnia 02/2019   Left bundle branch block 02/22/2019   Lesion of lip 10/2019   Pneumonia 10/22/2011   Renal disorder    Suicidal ideations 02/2019   Vitamin D deficiency 01/2019     SOCIAL HISTORY Social History   Socioeconomic History   Marital status: Legally Separated    Spouse name: Not on file   Number of children: Not on file   Years of education: Not on file   Highest education level: Not on file  Occupational History   Occupation: cook    Comment: lost job   Occupation: Company secretary    Comment: Updated June 2013   Occupation: Data processing manager    Comment: Began Jan 2014  Tobacco Use   Smoking status: Never   Smokeless tobacco: Never  Vaping Use   Vaping status: Never Used  Substance and Sexual Activity   Alcohol use: No    Alcohol/week: 0.0 standard drinks of alcohol   Drug use: No   Sexual activity: Yes    Partners: Female  Other Topics Concern   Not on file  Social History Narrative   Lives with his mother and brother.   Wife has had a foot amputation; is a type 1 DM.   Social Determinants of Health   Financial Resource Strain: High Risk (09/24/2022)   Received from [facility] and Its Subsidiaries and Affiliates, [facility] and Its Subsidiaries and Affiliates   Overall Financial Resource Strain (CARDIA)    Difficulty of Paying Living  Expenses: Very hard  Food Insecurity: Food Insecurity Present (09/24/2022)   Received from [facility] and Its Subsidiaries and Affiliates, [facility] and Its Subsidiaries and [employer] Vital Sign    Worried About Programme researcher, broadcasting/film/video in the Last Year: Sometimes true    [employer] of Food in the Last Year: Sometimes true  Transportation Needs: Unmet Transportation Needs (10/21/2022)   PRAPARE - Administrator, Civil Service (Medical): Yes    Lack of Transportation (Non-Medical): Yes  Physical Activity: Not on file  Stress: Stress Concern Present (09/24/2022)   Received from [facility] and Its Subsidiaries and [place], [facility]  and Its Subsidiaries and [employer] of Occupational Health - Occupational Stress Questionnaire    Feeling of Stress : Very much  Social Connections: Not on file  Intimate Partner Violence: Not At Risk (10/21/2022)   Humiliation, Afraid, Rape, and Kick questionnaire    Fear of Current or Ex-Partner: No    Emotionally Abused: No    Physically Abused: No    Sexually Abused: No     FAMILY HISTORY Family History  Problem Relation Age of Onset   Hypertension Mother 71   Cancer Mother        bladder cancer   Alcohol abuse Father    Hypertension Brother      Review of Systems: 12 systems were reviewed and negative except per HPI  Physical Exam: Vitals:   12/05/22 0449 12/05/22 0530  BP:  (!) 154/103  Pulse:  82  Resp:  13  Temp: 97.8 F (36.6 C)   SpO2:  98%   No intake/output data recorded. No intake or output data in the 24 hours ending 12/05/22 0658 General: well-appearing, no acute distress, sitting up in stretcher HEENT: anicteric sclera, MMM CV: normal rate, no murmurs Lungs: bibasilar crackles, bilateral chest rise, normal wob Abd: soft, non-tender, non-distended Skin: no visible lesions or rashes Msk: tense edema b/l LEs Neuro: normal speech, no gross focal  deficits  Dialysis access: RUE AVG in use  Test Results Reviewed Lab Results  Component Value Date   NA 137 12/04/2022   K 4.5 12/04/2022   CL 97 (L) 12/04/2022   CO2 24 12/04/2022   BUN 54 (H) 12/04/2022   CREATININE 8.89 (H) 12/04/2022   CALCIUM 7.8 (L) 12/04/2022   ALBUMIN 3.5 12/05/2022   PHOS 4.4 11/22/2022    I have reviewed relevant outside healthcare records

## 2022-12-06 ENCOUNTER — Emergency Department (HOSPITAL_COMMUNITY): Payer: 59

## 2022-12-06 ENCOUNTER — Encounter (HOSPITAL_COMMUNITY): Payer: Self-pay

## 2022-12-06 ENCOUNTER — Emergency Department (HOSPITAL_COMMUNITY)
Admission: EM | Admit: 2022-12-06 | Discharge: 2022-12-06 | Discharge status: Against Medical Advice | Payer: 59 | Attending: Emergency Medicine | Admitting: Emergency Medicine

## 2022-12-06 DIAGNOSIS — S50311A Abrasion of right elbow, initial encounter: Secondary | ICD-10-CM | POA: Diagnosis not present

## 2022-12-06 DIAGNOSIS — Z5321 Procedure and treatment not carried out due to patient leaving prior to being seen by health care provider: Secondary | ICD-10-CM | POA: Insufficient documentation

## 2022-12-06 DIAGNOSIS — W19XXXA Unspecified fall, initial encounter: Secondary | ICD-10-CM

## 2022-12-06 DIAGNOSIS — S80211A Abrasion, right knee, initial encounter: Secondary | ICD-10-CM | POA: Insufficient documentation

## 2022-12-06 DIAGNOSIS — S8991XA Unspecified injury of right lower leg, initial encounter: Secondary | ICD-10-CM | POA: Diagnosis not present

## 2022-12-06 DIAGNOSIS — Z992 Dependence on renal dialysis: Secondary | ICD-10-CM | POA: Diagnosis not present

## 2022-12-06 DIAGNOSIS — R0902 Hypoxemia: Secondary | ICD-10-CM | POA: Diagnosis not present

## 2022-12-06 DIAGNOSIS — W050XXA Fall from non-moving wheelchair, initial encounter: Secondary | ICD-10-CM | POA: Insufficient documentation

## 2022-12-06 DIAGNOSIS — R739 Hyperglycemia, unspecified: Secondary | ICD-10-CM | POA: Diagnosis not present

## 2022-12-06 DIAGNOSIS — S80212A Abrasion, left knee, initial encounter: Secondary | ICD-10-CM | POA: Insufficient documentation

## 2022-12-06 DIAGNOSIS — I10 Essential (primary) hypertension: Secondary | ICD-10-CM | POA: Diagnosis not present

## 2022-12-06 NOTE — ED Provider Notes (Incomplete)
[place] EMERGENCY DEPARTMENT AT [facility] Provider Note   CSN: [id number] Arrival date & time: 12/06/22  1149     History {Add pertinent medical, surgical, social history, OB history to HPI:1} Chief Complaint  Patient presents with   [name] is a 52 y.o. male.  HPI     52 y.o. male with medical history significant of Anxiety, DMII, hypertension, ESRD on HD TTS, chronic HFrEF 20 to 25%, Anemia of chronic disease, GERD,hx of MRSA bacteremia, homelessness,interim history of hospitalization 6/29-7/25 for following diagnoses Acute hypoxic respiratory failure due to CAP for which patient was treated with vance/cefepime an     Home Medications Prior to Admission medications   Medication Sig Start Date End Date Taking? Authorizing Provider  acetaminophen (TYLENOL) 500 MG tablet Take 1 tablet (500 mg total) by mouth every 8 (eight) hours as needed for mild pain. 05/28/22   Leroy Sea, MD  ASPIRIN LOW DOSE 81 MG tablet Take 1 tablet (81 mg total) by mouth daily. 05/28/22   Leroy Sea, MD  benzonatate (TESSALON) 100 MG capsule Take 1 capsule (100 mg total) by mouth every 8 (eight) hours. 09/09/22   Sabas Sous, MD  calcitRIOL (ROCALTROL) 0.25 MCG capsule Take 1 capsule (0.25 mcg total) by mouth every Tuesday, Thursday, and Saturday at 6 PM. 10/26/22   Ghimire, Werner Lean, MD  calcium acetate (PHOSLO) 667 MG capsule Take 3 capsules (2,001 mg total) by mouth 3 (three) times daily. 05/28/22   Leroy Sea, MD  carvedilol (COREG) 25 MG tablet Take 1 tablet (25 mg total) by mouth 2 (two) times daily with a meal. 10/26/22   Ghimire, Werner Lean, MD  glucose blood test strip Use to check fasting blood sugar once daily. 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 10/26/22   [provider]  insulin aspart (NOVOLOG FLEXPEN) 100 UNIT/ML FlexPen 0-9 Units, Subcutaneous, 3  times daily with meals CBG < 70: Implement Hypoglycemia measures CBG 70 - 120: 0 units CBG 121 - 150: 1 unit CBG 151 - 200: 2 units CBG 201 - 250: 3 units CBG 251 - 300: 5 units CBG 301 - 350: 7 units CBG 351 - 400: 9 units CBG > 400: call MD 10/26/22   Maretta Bees, MD  insulin glargine-yfgn (SEMGLEE) 100 UNIT/ML injection Inject 0.07 mLs (7 Units total) into the skin at bedtime. 10/26/22   Ghimire, Werner Lean, MD  Insulin Pen Needle 32G X 4 MM MISC Use with insulin pens 05/28/22   Leroy Sea, MD  isosorbide mononitrate (IMDUR) 60 MG 24 hr tablet Take 1 tablet (60 mg total) by mouth daily. 10/26/22   Ghimire, Werner Lean, MD  melatonin 3 MG TABS tablet Take 1 tablet (3 mg total) by mouth at bedtime as needed (insomnia). 10/26/22   Ghimire, Werner Lean, MD  oxyCODONE-acetaminophen (PERCOCET/ROXICET) 5-325 MG tablet Take 1 tablet by mouth every 12 (twelve) hours as needed for severe pain. 10/26/22   Ghimire, Werner Lean, MD  PROTONIX 40 MG tablet Take 1 tablet (40 mg total) by mouth daily. 05/25/22   Leroy Sea, MD  simethicone (MYLICON) 80 MG chewable tablet Chew 1 tablet (80 mg total) by mouth 4 (four) times daily for 5 days. 11/22/22 12/17/22  Rodolph Bong, MD  gabapentin (NEURONTIN) 300 MG capsule Take 1 capsule (300 mg total) by mouth  3 (three) times daily. Patient not taking: Reported on 07/27/2019 07/29/18 08/30/19  Kallie Locks, FNP  sildenafil (VIAGRA) 25 MG tablet Take 1 tablet (25 mg total) by mouth as needed for erectile dysfunction. for erectile dysfunction. Patient not taking: Reported on 08/11/2020 10/13/19 11/27/20  Kallie Locks, FNP      Allergies    Sulfa antibiotics, Other, Bactrim [sulfamethoxazole-trimethoprim], and Cephalexin    Review of Systems   Review of Systems  Physical Exam Updated Vital Signs BP (!) 176/94 (BP Location: Right Arm)   Pulse 92   Temp 98.1 F (36.7 C) (Oral)   Resp 17   SpO2 100%  Physical Exam  ED Results / Procedures / Treatments    Labs (all labs ordered are listed, but only abnormal results are displayed) Labs Reviewed - No data to display  EKG None  Radiology DG Chest Portable 1 View  Result Date: 12/05/2022 CLINICAL DATA:  Shortness of breath EXAM: PORTABLE CHEST 1 VIEW COMPARISON:  Chest radiograph 11/30/2022 FINDINGS: Stable cardiomegaly. Pulmonary vascular congestion. Hazy interstitial opacities in the mid and lower lungs bilaterally. No definite pleural effusion. No pneumothorax. Chronic fracture distal left clavicle. IMPRESSION: Cardiomegaly and pulmonary edema, slightly worse from 11/30/2022. Electronically Signed   By: Minerva Fester M.D.   On: 12/05/2022 00:38    Procedures Procedures  {Document cardiac monitor, telemetry assessment procedure when appropriate:1}  Medications Ordered in ED Medications - No data to display  ED Course/ Medical Decision Making/ A&P   {   Click here for ABCD2, HEART and other calculatorsREFRESH Note before signing :1}                          Medical Decision Making Amount and/or Complexity of Data Reviewed Radiology: ordered.   ***  {Document critical care time when appropriate:1} {Document review of labs and clinical decision tools ie heart score, Chads2Vasc2 etc:1}  {Document your independent review of radiology images, and any outside records:1} {Document your discussion with family members, caretakers, and with consultants:1} {Document social determinants of health affecting pt's care:1} {Document your decision making why or why not admission, treatments were needed:1} Final Clinical Impression(s) / ED Diagnoses Final diagnoses:  None    Rx / DC Orders ED Discharge Orders     None

## 2022-12-06 NOTE — ED Triage Notes (Signed)
Pt was sitting in a wheelchair and was going downhill a little to fast and came to an abrupt stop landing on his knees which have abrasions as well as his right elbow. No head pain, no head injury, no loc. Pt missed his dialysis today and yesterday.

## 2022-12-08 NOTE — ED Provider Notes (Signed)
    52 y.o. male with medical history significant of Anxiety, DMII, hypertension, ESRD on HD TTS, chronic HFrEF 20 to 25%, Anemia of chronic disease, GERD,hx of MRSA bacteremia, homelessness,interim history of hospitalization 6/29-7/25 for following diagnoses Acute hypoxic respiratory failure due to CAP for which patient was treated with vance/cefepime, who presented with concern for fall.  Left AMA yesterday after he had dialysis. Orders placed today, however he left prior to being evaluated.    Alvira Monday, MD 12/08/22 [phone]

## 2022-12-09 ENCOUNTER — Emergency Department (HOSPITAL_COMMUNITY)
Admission: EM | Admit: 2022-12-09 | Discharge: 2022-12-10 | Discharge status: Against Medical Advice | Payer: 59 | Attending: Emergency Medicine | Admitting: Emergency Medicine

## 2022-12-09 ENCOUNTER — Emergency Department (HOSPITAL_COMMUNITY): Payer: 59

## 2022-12-09 ENCOUNTER — Other Ambulatory Visit: Payer: Self-pay

## 2022-12-09 ENCOUNTER — Encounter (HOSPITAL_COMMUNITY): Payer: Self-pay

## 2022-12-09 DIAGNOSIS — D649 Anemia, unspecified: Secondary | ICD-10-CM | POA: Diagnosis not present

## 2022-12-09 DIAGNOSIS — N186 End stage renal disease: Secondary | ICD-10-CM | POA: Diagnosis not present

## 2022-12-09 DIAGNOSIS — R7989 Other specified abnormal findings of blood chemistry: Secondary | ICD-10-CM | POA: Diagnosis not present

## 2022-12-09 DIAGNOSIS — R001 Bradycardia, unspecified: Secondary | ICD-10-CM | POA: Diagnosis not present

## 2022-12-09 DIAGNOSIS — Z5900 Homelessness unspecified: Secondary | ICD-10-CM | POA: Diagnosis not present

## 2022-12-09 DIAGNOSIS — E1122 Type 2 diabetes mellitus with diabetic chronic kidney disease: Secondary | ICD-10-CM | POA: Insufficient documentation

## 2022-12-09 DIAGNOSIS — I509 Heart failure, unspecified: Secondary | ICD-10-CM | POA: Diagnosis not present

## 2022-12-09 DIAGNOSIS — Z7982 Long term (current) use of aspirin: Secondary | ICD-10-CM | POA: Insufficient documentation

## 2022-12-09 DIAGNOSIS — Z794 Long term (current) use of insulin: Secondary | ICD-10-CM | POA: Insufficient documentation

## 2022-12-09 DIAGNOSIS — Z992 Dependence on renal dialysis: Secondary | ICD-10-CM | POA: Diagnosis not present

## 2022-12-09 DIAGNOSIS — I132 Hypertensive heart and chronic kidney disease with heart failure and with stage 5 chronic kidney disease, or end stage renal disease: Secondary | ICD-10-CM | POA: Diagnosis not present

## 2022-12-09 DIAGNOSIS — Z79899 Other long term (current) drug therapy: Secondary | ICD-10-CM | POA: Insufficient documentation

## 2022-12-09 DIAGNOSIS — R0602 Shortness of breath: Secondary | ICD-10-CM | POA: Insufficient documentation

## 2022-12-09 DIAGNOSIS — R0689 Other abnormalities of breathing: Secondary | ICD-10-CM | POA: Diagnosis not present

## 2022-12-09 DIAGNOSIS — Z5329 Procedure and treatment not carried out because of patient's decision for other reasons: Secondary | ICD-10-CM | POA: Diagnosis not present

## 2022-12-09 DIAGNOSIS — I10 Essential (primary) hypertension: Secondary | ICD-10-CM | POA: Diagnosis not present

## 2022-12-09 DIAGNOSIS — I120 Hypertensive chronic kidney disease with stage 5 chronic kidney disease or end stage renal disease: Secondary | ICD-10-CM | POA: Diagnosis not present

## 2022-12-09 LAB — COMPREHENSIVE METABOLIC PANEL

## 2022-12-09 LAB — CBC WITH DIFFERENTIAL/PLATELET
Abs Immature Granulocytes: 0.02 10*3/uL (ref 0.00–0.07)
Basophils Absolute: 0 10*3/uL (ref 0.0–0.1)
Basophils Relative: 1 %
Eosinophils Absolute: 0.5 10*3/uL (ref 0.0–0.5)
Eosinophils Relative: 9 %
HCT: 39.7 % (ref 39.0–52.0)
Hemoglobin: 11.7 g/dL — ABNORMAL LOW (ref 13.0–17.0)
Immature Granulocytes: 0 %
Lymphocytes Relative: 16 %
Lymphs Abs: 0.9 10*3/uL (ref 0.7–4.0)
MCH: 25.3 pg — ABNORMAL LOW (ref 26.0–34.0)
MCHC: 29.5 g/dL — ABNORMAL LOW (ref 30.0–36.0)
MCV: 85.7 fL (ref 80.0–100.0)
Monocytes Absolute: 0.8 10*3/uL (ref 0.1–1.0)
Monocytes Relative: 15 %
Neutro Abs: 3.3 10*3/uL (ref 1.7–7.7)
Neutrophils Relative %: 59 %
Platelets: 325 10*3/uL (ref 150–400)
RBC: 4.63 MIL/uL (ref 4.22–5.81)
RDW: 17.6 % — ABNORMAL HIGH (ref 11.5–15.5)
WBC: 5.5 10*3/uL (ref 4.0–10.5)
nRBC: 0 % (ref 0.0–0.2)

## 2022-12-09 LAB — TROPONIN I (HIGH SENSITIVITY): Troponin I (High Sensitivity): 137 ng/L (ref <18)

## 2022-12-09 NOTE — ED Provider Notes (Signed)
[place] EMERGENCY DEPARTMENT AT [facility] Provider Note   CSN: [id number] Arrival date & time: 12/09/22  2136     History {Add pertinent medical, surgical, social history, OB history to HPI:1} Chief Complaint  Patient presents with   Shortness of Breath    [name] is a 52 y.o. male.  He has a history of end-stage renal disease and gets dialysis Tuesday Thursday Saturday, last 1 on Saturday.  It sounds like he only got 2 and half hours of dialysis.  He also has a history of hypertension and diabetes CHF.  Currently homeless, supposed to be on oxygen.  Complaining of increased shortness of breath today along with swelling on his leg swelling of his abdomen.  He denies any fever or chest pain.  Supposed to be on oxygen but cannot due to housing instability.   The history is provided by the patient.  Shortness of Breath Severity:  Moderate Onset quality:  Gradual Duration:  1 day Timing:  Constant Progression:  Unchanged Chronicity:  Recurrent Relieved by:  None tried Worsened by:  Activity Ineffective treatments:  Rest Associated symptoms: no abdominal pain, no chest pain, no fever, no hemoptysis, no sputum production and no vomiting        Home Medications Prior to Admission medications   Medication Sig Start Date End Date Taking? Authorizing Provider  acetaminophen (TYLENOL) 500 MG tablet Take 1 tablet (500 mg total) by mouth every 8 (eight) hours as needed for mild pain. 05/28/22   Leroy Sea, MD  ASPIRIN LOW DOSE 81 MG tablet Take 1 tablet (81 mg total) by mouth daily. 05/28/22   Leroy Sea, MD  benzonatate (TESSALON) 100 MG capsule Take 1 capsule (100 mg total) by mouth every 8 (eight) hours. 09/09/22   Sabas Sous, MD  calcitRIOL (ROCALTROL) 0.25 MCG capsule Take 1 capsule (0.25 mcg total) by mouth every Tuesday, Thursday, and Saturday at 6 PM. 10/26/22   Ghimire, Werner Lean, MD  calcium acetate (PHOSLO) 667 MG capsule Take 3 capsules (2,001  mg total) by mouth 3 (three) times daily. 05/28/22   Leroy Sea, MD  carvedilol (COREG) 25 MG tablet Take 1 tablet (25 mg total) by mouth 2 (two) times daily with a meal. 10/26/22   Ghimire, Werner Lean, MD  glucose blood test strip Use to check fasting blood sugar once daily. 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 10/26/22   [provider]  insulin aspart (NOVOLOG FLEXPEN) 100 UNIT/ML FlexPen 0-9 Units, Subcutaneous, 3 times daily with meals CBG < 70: Implement Hypoglycemia measures CBG 70 - 120: 0 units CBG 121 - 150: 1 unit CBG 151 - 200: 2 units CBG 201 - 250: 3 units CBG 251 - 300: 5 units CBG 301 - 350: 7 units CBG 351 - 400: 9 units CBG > 400: call MD 10/26/22   Maretta Bees, MD  insulin glargine-yfgn (SEMGLEE) 100 UNIT/ML injection Inject 0.07 mLs (7 Units total) into the skin at bedtime. 10/26/22   Ghimire, Werner Lean, MD  Insulin Pen Needle 32G X 4 MM MISC Use with insulin pens 05/28/22   Leroy Sea, MD  isosorbide mononitrate (IMDUR) 60 MG 24 hr tablet Take 1 tablet (60 mg total) by mouth daily. 10/26/22   Ghimire, Werner Lean, MD  melatonin 3 MG TABS tablet Take 1 tablet (3 mg total) by mouth at bedtime  as needed (insomnia). 10/26/22   Ghimire, Werner Lean, MD  oxyCODONE-acetaminophen (PERCOCET/ROXICET) 5-325 MG tablet Take 1 tablet by mouth every 12 (twelve) hours as needed for severe pain. 10/26/22   Ghimire, Werner Lean, MD  PROTONIX 40 MG tablet Take 1 tablet (40 mg total) by mouth daily. 05/25/22   Leroy Sea, MD  simethicone (MYLICON) 80 MG chewable tablet Chew 1 tablet (80 mg total) by mouth 4 (four) times daily for 5 days. 11/22/22 12/17/22  Rodolph Bong, MD  gabapentin (NEURONTIN) 300 MG capsule Take 1 capsule (300 mg total) by mouth 3 (three) times daily. Patient not taking: Reported on 07/27/2019 07/29/18 08/30/19  Kallie Locks, FNP  sildenafil (VIAGRA) 25 MG tablet Take 1 tablet  (25 mg total) by mouth as needed for erectile dysfunction. for erectile dysfunction. Patient not taking: Reported on 08/11/2020 10/13/19 11/27/20  Kallie Locks, FNP      Allergies    Sulfa antibiotics, Other, Bactrim [sulfamethoxazole-trimethoprim], and Cephalexin    Review of Systems   Review of Systems  Constitutional:  Negative for fever.  Respiratory:  Positive for shortness of breath. Negative for hemoptysis and sputum production.   Cardiovascular:  Positive for leg swelling. Negative for chest pain.  Gastrointestinal:  Positive for abdominal distention. Negative for abdominal pain and vomiting.    Physical Exam Updated Vital Signs BP (!) 165/98   Pulse 85   Temp 98.1 F (36.7 C) (Axillary)   Resp 17   SpO2 100%  Physical Exam Vitals and nursing note reviewed.  Constitutional:      General: He is not in acute distress.    Appearance: Normal appearance. He is well-developed.  HENT:     Head: Normocephalic and atraumatic.  Eyes:     Conjunctiva/sclera: Conjunctivae normal.  Cardiovascular:     Rate and Rhythm: Normal rate and regular rhythm.     Heart sounds: No murmur heard. Pulmonary:     Effort: Pulmonary effort is normal. No respiratory distress.     Breath sounds: Normal breath sounds.  Abdominal:     General: There is distension.     Tenderness: There is no abdominal tenderness.  Musculoskeletal:        General: No swelling.     Cervical back: Neck supple.     Right lower leg: Edema present.     Left lower leg: Edema present.     Comments: Fistula right forearm with positive pulse  Skin:    General: Skin is warm and dry.     Capillary Refill: Capillary refill takes less than 2 seconds.  Neurological:     General: No focal deficit present.     Mental Status: He is alert.     ED Results / Procedures / Treatments   Labs (all labs ordered are listed, but only abnormal results are displayed) Labs Reviewed  COMPREHENSIVE METABOLIC PANEL  CBC WITH  DIFFERENTIAL/PLATELET  TROPONIN I (HIGH SENSITIVITY)    EKG EKG Interpretation Date/Time:  Monday December 09 2022 21:45:12 EDT Ventricular Rate:  86 PR Interval:  147 QRS Duration:  143 QT Interval:  432 QTC Calculation: 517 R Axis:   129  Text Interpretation: Sinus rhythm Consider right atrial enlargement Left bundle branch block increased t waves from prior  7/24 Confirmed by Meridee Score [phone]) on 12/09/2022 9:55:30 PM  Radiology No results found.  Procedures Procedures  {Document cardiac monitor, telemetry assessment procedure when appropriate:1}  Medications Ordered in ED Medications - No data to display  ED Course/ Medical Decision Making/ A&P   {   Click here for ABCD2, HEART and other calculatorsREFRESH Note before signing :1}                          Medical Decision Making Amount and/or Complexity of Data Reviewed Labs: ordered. Radiology: ordered.   This patient complains of ***; this involves an extensive number of treatment Options and is a complaint that carries with it a high risk of complications and morbidity. The differential includes ***  I ordered, reviewed and interpreted labs, which included *** I ordered medication *** and reviewed PMP when indicated. I ordered imaging studies which included *** and I independently    visualized and interpreted imaging which showed *** Additional history obtained from *** Previous records obtained and reviewed *** I consulted *** and discussed lab and imaging findings and discussed disposition.  Cardiac monitoring reviewed, *** Social determinants considered, *** Critical Interventions: ***  After the interventions stated above, I reevaluated the patient and found *** Admission and further testing considered, ***   {Document critical care time when appropriate:1} {Document review of labs and clinical decision tools ie heart score, Chads2Vasc2 etc:1}  {Document your independent review of radiology  images, and any outside records:1} {Document your discussion with family members, caretakers, and with consultants:1} {Document social determinants of health affecting pt's care:1} {Document your decision making why or why not admission, treatments were needed:1} Final Clinical Impression(s) / ED Diagnoses Final diagnoses:  None    Rx / DC Orders ED Discharge Orders     None

## 2022-12-09 NOTE — ED Triage Notes (Signed)
SHOB since AM. Supposed to be on 4LNC, along w/ chronic home meds he is unable to access for financial reasons. 92% RA. Hx of LBB. Abdomen more distended than normal, hx of dialysis (Tue, Thurs, Sat schedule - no missed appointments)

## 2022-12-09 NOTE — ED Notes (Signed)
Patient in NAD. Unwilling to answer triage questions. Unwilling to switch into gown when asked. We advised we could only help him if would talk to [place]. Still no verbal answer from him.

## 2022-12-10 LAB — COMPREHENSIVE METABOLIC PANEL

## 2022-12-10 NOTE — ED Provider Notes (Signed)
Awaiting labs and CT and patient reportedly left   Raegan Sipp, MD 12/10/22 0500

## 2022-12-10 NOTE — ED Notes (Signed)
Phlebotomy tried 3 times to get blood work unsuccessful

## 2022-12-10 NOTE — ED Notes (Signed)
Called IV team who advised they would be down as soon as they could.

## 2022-12-10 NOTE — ED Notes (Signed)
Patient refusing any other tests stating he needs to get to dialysis. I explained he would get dialysis here, but he stated he still needs to go.

## 2022-12-10 NOTE — Progress Notes (Signed)
This IV Nurse is at bedside at this time for an IV start as consulted. Pt. Is sitting on side of bed, on room air. No s/s of respi distress noted as of this time.. Pt asked to lie down in bed  to for this IV Nurse  can start PIV. Pt. Is uncooperative. RN called at bedside and explained to pt. The need of PIV.Marland Kitchen  pt. Declined and wanted to see MD.

## 2022-12-10 NOTE — Progress Notes (Addendum)
Patient laying on left side on stretcher with no s/s of distress stating that he doesn't want an IV placed. This RN stated purpose of PIV. Patient agreed to PIV placement. Patient resistant to changing position. PIV x1 attempt to left lateral AC. Unable to establish PIV. Sondra Barges notified that IV Team 2nd assess placed.

## 2022-12-10 NOTE — ED Notes (Signed)
When speaking with patient he advised "I came here for dialysis, but you waited too long, so I'm going to my treatment clinic". Patient refused to sign AMA forms and was wheeled outside.

## 2022-12-12 ENCOUNTER — Other Ambulatory Visit: Payer: Self-pay

## 2022-12-12 MED ORDER — CALCIUM ACETATE (PHOS BINDER) 667 MG PO TABS
2001.0000 mg | ORAL_TABLET | Freq: Three times a day (TID) | ORAL | 3 refills | Status: DC
Start: 2022-12-12 — End: 2023-03-17
  Filled 2022-12-12: qty 200, 22d supply, fill #0

## 2022-12-12 MED ORDER — ISOSORBIDE MONONITRATE ER 60 MG PO TB24
60.0000 mg | ORAL_TABLET | Freq: Every evening | ORAL | 3 refills | Status: DC
  Filled 2022-12-12: qty 30, 30d supply, fill #0

## 2022-12-12 MED ORDER — CARVEDILOL 25 MG PO TABS
25.0000 mg | ORAL_TABLET | Freq: Two times a day (BID) | ORAL | 3 refills | Status: DC
  Filled 2022-12-12: qty 60, 30d supply, fill #0

## 2022-12-15 ENCOUNTER — Other Ambulatory Visit: Payer: Self-pay

## 2022-12-15 ENCOUNTER — Emergency Department (HOSPITAL_COMMUNITY)
Admission: EM | Admit: 2022-12-15 | Discharge: 2022-12-15 | Disposition: A | Discharge status: Home / Self Care | Payer: 59 | Attending: Emergency Medicine | Admitting: Emergency Medicine

## 2022-12-15 DIAGNOSIS — R0602 Shortness of breath: Secondary | ICD-10-CM | POA: Insufficient documentation

## 2022-12-15 DIAGNOSIS — Z992 Dependence on renal dialysis: Secondary | ICD-10-CM | POA: Insufficient documentation

## 2022-12-15 DIAGNOSIS — R600 Localized edema: Secondary | ICD-10-CM | POA: Insufficient documentation

## 2022-12-15 DIAGNOSIS — R001 Bradycardia, unspecified: Secondary | ICD-10-CM | POA: Diagnosis not present

## 2022-12-15 DIAGNOSIS — R0902 Hypoxemia: Secondary | ICD-10-CM | POA: Diagnosis not present

## 2022-12-15 DIAGNOSIS — I10 Essential (primary) hypertension: Secondary | ICD-10-CM | POA: Diagnosis not present

## 2022-12-15 LAB — CBC WITH DIFFERENTIAL/PLATELET
Abs Immature Granulocytes: 0.02 10*3/uL (ref 0.00–0.07)
Basophils Absolute: 0 10*3/uL (ref 0.0–0.1)
Basophils Relative: 1 %
Eosinophils Absolute: 0.4 10*3/uL (ref 0.0–0.5)
Eosinophils Relative: 6 %
HCT: 42.6 % (ref 39.0–52.0)
Hemoglobin: 12.5 g/dL — ABNORMAL LOW (ref 13.0–17.0)
Immature Granulocytes: 0 %
Lymphocytes Relative: 18 %
Lymphs Abs: 1.1 10*3/uL (ref 0.7–4.0)
MCH: 25.8 pg — ABNORMAL LOW (ref 26.0–34.0)
MCHC: 29.3 g/dL — ABNORMAL LOW (ref 30.0–36.0)
MCV: 88 fL (ref 80.0–100.0)
Monocytes Absolute: 1.1 10*3/uL — ABNORMAL HIGH (ref 0.1–1.0)
Monocytes Relative: 19 %
Neutro Abs: 3.4 10*3/uL (ref 1.7–7.7)
Neutrophils Relative %: 56 %
Platelets: 225 10*3/uL (ref 150–400)
RBC: 4.84 MIL/uL (ref 4.22–5.81)
RDW: 19.8 % — ABNORMAL HIGH (ref 11.5–15.5)
WBC: 6 10*3/uL (ref 4.0–10.5)
nRBC: 0 % (ref 0.0–0.2)

## 2022-12-15 LAB — BASIC METABOLIC PANEL
Anion gap: 17 — ABNORMAL HIGH (ref 5–15)
BUN: 54 mg/dL — ABNORMAL HIGH (ref 6–20)
CO2: 22 mmol/L (ref 22–32)
Calcium: 7.6 mg/dL — ABNORMAL LOW (ref 8.9–10.3)
Chloride: 96 mmol/L — ABNORMAL LOW (ref 98–111)
Creatinine, Ser: 8.51 mg/dL — ABNORMAL HIGH (ref 0.61–1.24)
GFR, Estimated: 7 mL/min — ABNORMAL LOW (ref >60)
Glucose, Bld: 128 mg/dL — ABNORMAL HIGH (ref 70–99)
Potassium: 4.9 mmol/L (ref 3.5–5.1)
Sodium: 135 mmol/L (ref 135–145)

## 2022-12-15 NOTE — ED Provider Notes (Signed)
[place] [facility] AT [facility] Provider Note   CSN: [id number] Arrival date & time: 12/15/22  1330     History  Chief Complaint  Patient presents with   Shortness of Breath    [name] is a 52 y.o. male, history of dialysis, on Tuesday/Thursday/Saturday.  He states that he is here for shortness of breath, secondary to missing his part of his GI dialysis session yesterday.  He states he came late, so did not get a completely dialyzed.  He states he is swelling of his abdomen, and his legs, and is little short of breath.  He states that his shortness of breath is chronic, and states it is not worse than usual.     Home Medications Prior to Admission medications   Medication Sig Start Date End Date Taking? Authorizing Provider  acetaminophen (TYLENOL) 500 MG tablet Take 1 tablet (500 mg total) by mouth every 8 (eight) hours as needed for mild pain. 05/28/22   Leroy Sea, MD  ASPIRIN LOW DOSE 81 MG tablet Take 1 tablet (81 mg total) by mouth daily. 05/28/22   Leroy Sea, MD  benzonatate (TESSALON) 100 MG capsule Take 1 capsule (100 mg total) by mouth every 8 (eight) hours. 09/09/22   Sabas Sous, MD  calcitRIOL (ROCALTROL) 0.25 MCG capsule Take 1 capsule (0.25 mcg total) by mouth every Tuesday, Thursday, and Saturday at 6 PM. 10/26/22   Ghimire, Werner Lean, MD  calcium acetate (PHOSLO) 667 MG capsule Take 3 capsules (2,001 mg total) by mouth 3 (three) times daily. 05/28/22   Leroy Sea, MD  calcium acetate (PHOSLO) 667 MG tablet Take 3 tablets (2,001 mg total) by mouth 3 (three) times daily. 12/12/22     carvedilol (COREG) 25 MG tablet Take 1 tablet (25 mg total) by mouth 2 (two) times daily with a meal. 10/26/22   Ghimire, Werner Lean, MD  carvedilol (COREG) 25 MG tablet Take 1 tablet by mouth twice a day 12/12/22     glucose blood test strip Use to check fasting blood sugar once daily. 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 10/26/22   [provider]  insulin aspart (NOVOLOG FLEXPEN) 100 UNIT/ML FlexPen 0-9 Units, Subcutaneous, 3 times daily with meals CBG < 70: Implement Hypoglycemia measures CBG 70 - 120: 0 units CBG 121 - 150: 1 unit CBG 151 - 200: 2 units CBG 201 - 250: 3 units CBG 251 - 300: 5 units CBG 301 - 350: 7 units CBG 351 - 400: 9 units CBG > 400: call MD 10/26/22   Maretta Bees, MD  insulin glargine-yfgn (SEMGLEE) 100 UNIT/ML injection Inject 0.07 mLs (7 Units total) into the skin at bedtime. 10/26/22   Ghimire, Werner Lean, MD  Insulin Pen Needle 32G X 4 MM MISC Use with insulin pens 05/28/22   Leroy Sea, MD  isosorbide mononitrate (IMDUR) 60 MG 24 hr tablet Take 1 tablet (60 mg total) by mouth daily. 10/26/22   Ghimire, Werner Lean, MD  isosorbide mononitrate (IMDUR) 60 MG 24 hr tablet Take 1 tablet (60 mg total) by mouth Nightly. 12/12/22     melatonin 3 MG TABS tablet Take 1 tablet (3 mg total) by mouth at bedtime as needed (insomnia). 10/26/22   Ghimire, Werner Lean, MD  oxyCODONE-acetaminophen (PERCOCET/ROXICET) 5-325 MG tablet Take 1 tablet by mouth every 12 (twelve) hours as  needed for severe pain. 10/26/22   Ghimire, Werner Lean, MD  PROTONIX 40 MG tablet Take 1 tablet (40 mg total) by mouth daily. 05/25/22   Leroy Sea, MD  simethicone (MYLICON) 80 MG chewable tablet Chew 1 tablet (80 mg total) by mouth 4 (four) times daily for 5 days. 11/22/22 12/17/22  Rodolph Bong, MD  gabapentin (NEURONTIN) 300 MG capsule Take 1 capsule (300 mg total) by mouth 3 (three) times daily. Patient not taking: Reported on 07/27/2019 07/29/18 08/30/19  Kallie Locks, FNP  sildenafil (VIAGRA) 25 MG tablet Take 1 tablet (25 mg total) by mouth as needed for erectile dysfunction. for erectile dysfunction. Patient not taking: Reported on 08/11/2020 10/13/19 11/27/20  Kallie Locks, FNP      Allergies    Sulfa antibiotics, Other, Bactrim  [sulfamethoxazole-trimethoprim], and Cephalexin    Review of Systems   Review of Systems  Respiratory:  Positive for shortness of breath.   Cardiovascular:  Negative for chest pain.    Physical Exam Updated Vital Signs BP (!) 171/90 (BP Location: Left Arm)   Pulse 80   Temp 97.8 F (36.6 C) (Oral)   Resp 16   SpO2 96%  Physical Exam Vitals and nursing note reviewed.  Constitutional:      General: He is not in acute distress.    Appearance: He is well-developed.  HENT:     Head: Normocephalic and atraumatic.  Eyes:     Conjunctiva/sclera: Conjunctivae normal.  Cardiovascular:     Rate and Rhythm: Normal rate and regular rhythm.     Heart sounds: No murmur heard. Pulmonary:     Effort: Pulmonary effort is normal. No respiratory distress.     Breath sounds: Normal breath sounds.  Abdominal:     Palpations: Abdomen is soft.     Tenderness: There is no abdominal tenderness.  Musculoskeletal:        General: No swelling.     Cervical back: Neck supple.     Right lower leg: Edema present.     Left lower leg: Edema present.  Skin:    General: Skin is warm and dry.     Capillary Refill: Capillary refill takes less than 2 seconds.  Neurological:     Mental Status: He is alert.  Psychiatric:        Mood and Affect: Mood normal.     ED Results / Procedures / Treatments   Labs (all labs ordered are listed, but only abnormal results are displayed) Labs Reviewed  BASIC METABOLIC PANEL - Abnormal; Notable for the following components:      Result Value   Chloride 96 (*)    Glucose, Bld 128 (*)    BUN 54 (*)    Creatinine, Ser 8.51 (*)    Calcium 7.6 (*)    GFR, Estimated 7 (*)    Anion gap 17 (*)    All other components within normal limits  CBC WITH DIFFERENTIAL/PLATELET - Abnormal; Notable for the following components:   Hemoglobin 12.5 (*)    MCH 25.8 (*)    MCHC 29.3 (*)    RDW 19.8 (*)    Monocytes Absolute 1.1 (*)    All other components within normal limits     EKG EKG Interpretation Date/Time:  Sunday December 15 2022 13:43:46 EDT Ventricular Rate:  82 PR Interval:  152 QRS Duration:  150 QT Interval:  450 QTC Calculation: 525 R Axis:   133  Text Interpretation: Normal sinus rhythm Left  bundle branch block Abnormal ECG When compared with ECG of 09-Dec-2022 21:45, PREVIOUS ECG IS PRESENT No significant change since last tracing Confirmed by Margarita Grizzle [phone]) on 12/15/2022 2:01:50 PM  Radiology No results found.  Procedures Procedures    Medications Ordered in ED Medications - No data to display  ED Course/ Medical Decision Making/ A&P                             Medical Decision Making Patient is a 52 year old male, here for shortness of breath, he has refused to have his vitals taken, and largely answer questions.  EKG, blood work ordered, hemoglobin is stable, no acute electrolyte abnormalities.  He is on room air, and only 96% on room air.  Well-appearing referral previously refuses chest x-ray, we discussed risk associated with this, and he has decided to leave AGAINST MEDICAL ADVICE.  Now he is refusing to leave, and requesting another blanket, security called.  Patient informed that he may return anytime for another evaluation.  Amount and/or Complexity of Data Reviewed Radiology: ordered.   Final Clinical Impression(s) / ED Diagnoses Final diagnoses:  SOB (shortness of breath)    Rx / DC Orders ED Discharge Orders     None         Dimitriy Carreras, Harley [place], PA 12/15/22 1850    Sloan Leiter, DO 12/16/22 1525

## 2022-12-15 NOTE — ED Notes (Signed)
Pt refusing care. Security at bedside to escort patient out per EDP

## 2022-12-15 NOTE — Discharge Instructions (Signed)
You have refused medical care, please return to the ER if you have worsening symptoms, or would like to be reevaluated.

## 2022-12-15 NOTE — ED Provider Triage Note (Signed)
Emergency Medicine Provider Triage Evaluation Note  TABORIS TEDROW , a 52 y.o. male  was evaluated in triage.  Pt complains of shortness of breath. [facility] Patient said that he had partial hemodialysis yesterday.  Today he was sitting, became suddenly short of breath.  He thinks that he is volume overloaded.  He is however refusing chest x-ray.  Review of Systems  Positive: Shortness of breath Negative: Chest pain  Physical Exam  BP (!) 171/90 (BP Location: Left Arm)   Pulse 80   Temp 97.8 F (36.6 C) (Oral)   Resp 16   SpO2 96%  Gen:   Awake, no distress   Resp:  Normal effort  MSK:   Moves extremities without difficulty  Medical Decision Making  Medically screening exam initiated at 3:39 PM.  Appropriate orders placed.  LOYS BLASS was informed that the remainder of the evaluation will be completed by another provider, this initial triage assessment does not replace that evaluation, and the importance of remaining in the ED until their evaluation is complete.  Patient has declined chest x-ray.  States that he is sure that he has fluid overload.  I indicated to him that I cannot summon nephrologist for emergent hemodialysis without evidence of actual volume overload and that he does not have evidence of fluid overload on physical exam.   Patient agrees to blood work only.   Derwood Kaplan, MD 12/15/22 [phone]

## 2022-12-15 NOTE — ED Notes (Signed)
Pt refused to have vitals taken. Pt refused to even answer questions. Does not seem to be in distress. Laying on his side, facing away from me and would not look or speak. Pt stated he needed another blanket, I advised I needed his vitals and he would not allow that.

## 2022-12-15 NOTE — ED Triage Notes (Signed)
[facility] for eval of sob. Had partial dialysis treatment yesterday. 88% room air with exertion but 96% at rest.

## 2022-12-15 NOTE — ED Notes (Signed)
Patient refusing xray. MD aware.

## 2022-12-17 ENCOUNTER — Other Ambulatory Visit: Payer: Self-pay

## 2022-12-17 ENCOUNTER — Encounter (HOSPITAL_COMMUNITY): Payer: Self-pay | Admitting: Emergency Medicine

## 2022-12-17 ENCOUNTER — Emergency Department (HOSPITAL_COMMUNITY): Payer: 59

## 2022-12-17 ENCOUNTER — Ambulatory Visit (HOSPITAL_COMMUNITY)
Admission: EM | Admit: 2022-12-17 | Discharge: 2022-12-17 | Disposition: A | Discharge status: Home / Self Care | Payer: 59 | Attending: Internal Medicine | Admitting: Internal Medicine

## 2022-12-17 ENCOUNTER — Emergency Department (HOSPITAL_COMMUNITY)
Admission: EM | Admit: 2022-12-17 | Discharge: 2022-12-18 | Disposition: A | Discharge status: Home / Self Care | Payer: 59 | Attending: Emergency Medicine | Admitting: Emergency Medicine

## 2022-12-17 DIAGNOSIS — R109 Unspecified abdominal pain: Secondary | ICD-10-CM | POA: Diagnosis not present

## 2022-12-17 DIAGNOSIS — E1122 Type 2 diabetes mellitus with diabetic chronic kidney disease: Secondary | ICD-10-CM | POA: Insufficient documentation

## 2022-12-17 DIAGNOSIS — N186 End stage renal disease: Secondary | ICD-10-CM | POA: Insufficient documentation

## 2022-12-17 DIAGNOSIS — R600 Localized edema: Secondary | ICD-10-CM | POA: Diagnosis not present

## 2022-12-17 DIAGNOSIS — R188 Other ascites: Secondary | ICD-10-CM | POA: Diagnosis not present

## 2022-12-17 DIAGNOSIS — I120 Hypertensive chronic kidney disease with stage 5 chronic kidney disease or end stage renal disease: Secondary | ICD-10-CM | POA: Insufficient documentation

## 2022-12-17 DIAGNOSIS — Z7982 Long term (current) use of aspirin: Secondary | ICD-10-CM | POA: Insufficient documentation

## 2022-12-17 DIAGNOSIS — R112 Nausea with vomiting, unspecified: Secondary | ICD-10-CM | POA: Diagnosis not present

## 2022-12-17 DIAGNOSIS — K760 Fatty (change of) liver, not elsewhere classified: Secondary | ICD-10-CM | POA: Diagnosis not present

## 2022-12-17 DIAGNOSIS — K828 Other specified diseases of gallbladder: Secondary | ICD-10-CM | POA: Diagnosis not present

## 2022-12-17 DIAGNOSIS — Z794 Long term (current) use of insulin: Secondary | ICD-10-CM | POA: Diagnosis not present

## 2022-12-17 DIAGNOSIS — K920 Hematemesis: Secondary | ICD-10-CM | POA: Diagnosis not present

## 2022-12-17 DIAGNOSIS — R918 Other nonspecific abnormal finding of lung field: Secondary | ICD-10-CM | POA: Diagnosis not present

## 2022-12-17 DIAGNOSIS — R140 Abdominal distension (gaseous): Secondary | ICD-10-CM | POA: Diagnosis not present

## 2022-12-17 DIAGNOSIS — R1084 Generalized abdominal pain: Secondary | ICD-10-CM | POA: Diagnosis not present

## 2022-12-17 DIAGNOSIS — R0602 Shortness of breath: Secondary | ICD-10-CM | POA: Diagnosis not present

## 2022-12-17 DIAGNOSIS — D649 Anemia, unspecified: Secondary | ICD-10-CM | POA: Diagnosis not present

## 2022-12-17 DIAGNOSIS — Z79899 Other long term (current) drug therapy: Secondary | ICD-10-CM | POA: Diagnosis not present

## 2022-12-17 DIAGNOSIS — Z992 Dependence on renal dialysis: Secondary | ICD-10-CM

## 2022-12-17 LAB — CBG MONITORING, ED: Glucose-Capillary: 130 mg/dL — ABNORMAL HIGH (ref 70–99)

## 2022-12-17 MED ORDER — ONDANSETRON 4 MG PO TBDP
8.0000 mg | ORAL_TABLET | Freq: Once | ORAL | Status: AC
  Administered 2022-12-18: 8 mg via ORAL
  Filled 2022-12-17: qty 2

## 2022-12-17 MED ORDER — ONDANSETRON HCL 4 MG/2ML IJ SOLN: 4.0000 mg | Freq: Once | INTRAMUSCULAR | Status: DC

## 2022-12-17 MED ORDER — ONDANSETRON HCL 4 MG/2ML IJ SOLN
INTRAMUSCULAR | Status: AC
  Filled 2022-12-17: qty 2

## 2022-12-17 MED ORDER — ONDANSETRON 4 MG PO TBDP: 8.0000 mg | ORAL_TABLET | Freq: Once | ORAL | Status: DC

## 2022-12-17 MED ORDER — ONDANSETRON HCL 4 MG/2ML IJ SOLN
4.0000 mg | Freq: Once | INTRAMUSCULAR | Status: AC
  Administered 2022-12-17: 4 mg via INTRAMUSCULAR

## 2022-12-17 NOTE — ED Notes (Signed)
Counselling psychologist made aware pt coming via [facility]

## 2022-12-17 NOTE — ED Notes (Signed)
Patient is being discharged from the Urgent Care and sent to the Emergency Department via [facility]. Per Rennis Chris, NP, patient is in need of higher level of care due to hypertension, abdominal distention, missed dialysis today, coffee ground emesis. Patient is aware and verbalizes understanding of plan of care.  Vitals:   12/17/22 1401  BP: (!) 200/110  Pulse: 83  Resp: 14  Temp: (!) 97.5 F (36.4 C)  SpO2: 95%

## 2022-12-17 NOTE — ED Triage Notes (Signed)
Pt c/o abd bloating for a week. Reports missed dialysis today.

## 2022-12-17 NOTE — ED Notes (Signed)
[facility] notified of transport needed to ED.

## 2022-12-17 NOTE — ED Triage Notes (Signed)
Per [facility] pt c/o emesis and abdominal bloating x 2 weeks. Missed dialysis today due to transportation issues and had half a treatment on Saturday. Given 4mg  zofran IM en route.

## 2022-12-17 NOTE — ED Notes (Signed)
Patient called for vital signs, w/ no answer x3. Dragging OTF.

## 2022-12-17 NOTE — Congregational Nurse Program (Signed)
Dept: [phone]   Congregational Nurse Program Note  Date of Encounter: 12/17/2022  Past Medical History: Past Medical History:  Diagnosis Date   Anxiety 02/2019   Anxiety 10/2019   Diabetes mellitus type II, uncontrolled 07/17/2006   Elevated alkaline phosphatase level 01/2019   ESSENTIAL HYPERTENSION 07/17/2006   Homelessness 10/13/2021   HYPERLIPIDEMIA 11/24/2008   Insomnia 02/2019   Left bundle branch block 02/22/2019   Lesion of lip 10/2019   Pneumonia 10/22/2011   Renal disorder    Suicidal ideations 02/2019   Vitamin D deficiency 01/2019    Encounter Details:  CNP Questionnaire - 12/17/22 1000       Questionnaire   Ask client: Do you give verbal consent for me to treat you today? 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    Insurance/Financial Assistance Referral N/A    Medication N/A    Medical Provider Yes    Screening Referrals Made N/A    Medical Referrals Made Non-[facility] PCP/Clinic    Medical Appointment Made Other;Non-[facility] PCP/clinic    Recently w/o PCP, now 1st time PCP visit completed due to CNs referral or appointment made N/A    Food Have Food Insecurities    Transportation Need transportation assistance    Housing/Utilities No permanent housing    Interpersonal Safety Do not feel safe at current residence    Interventions Advocate/Support;Navigate Healthcare System;Case Management;Counsel;Reviewed Medications    Abnormal to Normal Screening Since Last CN Visit N/A    Screenings CN Performed Blood Pressure;Blood Glucose;Pulse Ox    Sent Client to Lab for: N/A    Did client attend any of the following based off CNs referral or appointments made? N/A    ED Visit Averted Yes    Life-Saving Intervention Made N/A               Dept: [phone]   Congregational Nurse Program Note  Date of Encounter:  12/17/2022  Clinic visit for pain right knee and to obtain assistance with medications.  States that he fell twice, has open abrasion center of right knee seen after removing 2x2 gauze with dried blood on it.  Top layer of skin is off no bleeding or drainage noted, area cleaned with alcohol pads and clean 2x2 gauze applied secured with paper tape.  Missed dialysis appointment this AM and is unclear regarding when he last took medications for diabetes and hypertension.  BP elevated 170/90, blood glucose 189 AC lunch.  Called [facility] and scheduled make-up appointment for Wednesday at 7:40A, is to still go for dialysis on Thursday. Called PCP office regarding lack of medication, made appointment for Friday at 1015A in order to have medications reviewed to determine current medication needs.  Past Medical History: Past Medical History:  Diagnosis Date   Anxiety 02/2019   Anxiety 10/2019   Diabetes mellitus type II, uncontrolled 07/17/2006   Elevated alkaline phosphatase level 01/2019   ESSENTIAL HYPERTENSION 07/17/2006   Homelessness 10/13/2021   HYPERLIPIDEMIA 11/24/2008   Insomnia 02/2019   Left bundle branch block 02/22/2019   Lesion of lip 10/2019   Pneumonia 10/22/2011   Renal disorder    Suicidal ideations 02/2019   Vitamin D deficiency 01/2019    Encounter Details:  CNP Questionnaire - 12/17/22 1000       Questionnaire   Ask  client: Do you give verbal consent for me to treat you today? 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    Insurance/Financial Assistance Referral N/A    Medication N/A    Medical Provider Yes    Screening Referrals Made N/A    Medical Referrals Made Non-[facility] PCP/Clinic    Medical Appointment Made Other    Recently w/o PCP, now 1st time PCP visit completed due to CNs referral or appointment made N/A     Food Have Food Insecurities    Transportation Need transportation assistance    Housing/Utilities No permanent housing    Interpersonal Safety Do not feel safe at current residence    Interventions Advocate/Support;Navigate Healthcare System;Case Management;Counsel    Abnormal to Normal Screening Since Last CN Visit N/A    Screenings CN Performed Blood Pressure;Blood Glucose;Pulse Ox    Sent Client to Lab for: N/A    Did client attend any of the following based off CNs referral or appointments made? N/A    ED Visit Averted Yes    Life-Saving Intervention Made N/A

## 2022-12-17 NOTE — ED Notes (Signed)
Patient moved back into the lobby.

## 2022-12-17 NOTE — ED Provider Notes (Signed)
Patient presents to urgent care for evaluation of shortness of breath and abdominal bloating that started 2 days ago.  History of end-stage renal disease on dialysis, missed dialysis appointment today due to lack of transportation.  He only received one half of his most recent dialysis session on Saturday (3 days ago).  He has been seen multiple times in the emergency department over the last 1 to 2 weeks and has left AMA multiple times.  While sitting in urgent care, patient became nauseous and experienced multiple episodes of coffee-ground emesis.  4 mg Zofran given IM.  Recommend transport to the nearest emergency department via [facility] transport service for further evaluation and management of symptoms.   Abdomen is firm, protuberant, and distended. Vital signs are stable to patient's baseline. HR 83, O2 95% on room air, BP 200/110, RR 20, afebrile  Discussed plan of care with patient who expresses understanding and agreement with plan.  Attempted 2 times to obtain IV to the left hand unsuccessfully.   Patient discharged from urgent care with stable vital signs via [facility].   Carlisle Beers, [place] 12/17/22 1436

## 2022-12-18 ENCOUNTER — Other Ambulatory Visit: Payer: Self-pay

## 2022-12-18 ENCOUNTER — Encounter: Payer: Self-pay | Admitting: Physician Assistant

## 2022-12-18 ENCOUNTER — Emergency Department (HOSPITAL_COMMUNITY): Payer: 59

## 2022-12-18 ENCOUNTER — Emergency Department (HOSPITAL_COMMUNITY)
Admission: EM | Admit: 2022-12-18 | Discharge: 2022-12-18 | Disposition: A | Discharge status: Home / Self Care | Payer: 59 | Attending: Emergency Medicine | Admitting: Emergency Medicine

## 2022-12-18 ENCOUNTER — Ambulatory Visit: Payer: Self-pay | Admitting: Infectious Diseases

## 2022-12-18 DIAGNOSIS — Z992 Dependence on renal dialysis: Secondary | ICD-10-CM | POA: Insufficient documentation

## 2022-12-18 DIAGNOSIS — R1084 Generalized abdominal pain: Secondary | ICD-10-CM

## 2022-12-18 DIAGNOSIS — R140 Abdominal distension (gaseous): Secondary | ICD-10-CM | POA: Diagnosis not present

## 2022-12-18 DIAGNOSIS — K760 Fatty (change of) liver, not elsewhere classified: Secondary | ICD-10-CM | POA: Diagnosis not present

## 2022-12-18 DIAGNOSIS — R188 Other ascites: Secondary | ICD-10-CM

## 2022-12-18 DIAGNOSIS — Z794 Long term (current) use of insulin: Secondary | ICD-10-CM | POA: Diagnosis not present

## 2022-12-18 DIAGNOSIS — N186 End stage renal disease: Secondary | ICD-10-CM | POA: Diagnosis not present

## 2022-12-18 DIAGNOSIS — R112 Nausea with vomiting, unspecified: Secondary | ICD-10-CM | POA: Diagnosis not present

## 2022-12-18 DIAGNOSIS — R109 Unspecified abdominal pain: Secondary | ICD-10-CM | POA: Diagnosis not present

## 2022-12-18 DIAGNOSIS — Z7982 Long term (current) use of aspirin: Secondary | ICD-10-CM | POA: Diagnosis not present

## 2022-12-18 DIAGNOSIS — E1122 Type 2 diabetes mellitus with diabetic chronic kidney disease: Secondary | ICD-10-CM | POA: Diagnosis not present

## 2022-12-18 DIAGNOSIS — R918 Other nonspecific abnormal finding of lung field: Secondary | ICD-10-CM | POA: Diagnosis not present

## 2022-12-18 DIAGNOSIS — K828 Other specified diseases of gallbladder: Secondary | ICD-10-CM | POA: Diagnosis not present

## 2022-12-18 LAB — CBC
HCT: 40.7 % (ref 39.0–52.0)
Hemoglobin: 12.3 g/dL — ABNORMAL LOW (ref 13.0–17.0)
MCH: 25 pg — ABNORMAL LOW (ref 26.0–34.0)
MCHC: 30.2 g/dL (ref 30.0–36.0)
MCV: 82.7 fL (ref 80.0–100.0)
Platelets: 335 10*3/uL (ref 150–400)
RBC: 4.92 MIL/uL (ref 4.22–5.81)
RDW: 19.5 % — ABNORMAL HIGH (ref 11.5–15.5)
WBC: 7.9 10*3/uL (ref 4.0–10.5)
nRBC: 0 % (ref 0.0–0.2)

## 2022-12-18 LAB — RENAL FUNCTION PANEL
Albumin: 3.2 g/dL — ABNORMAL LOW (ref 3.5–5.0)
Anion gap: 21 — ABNORMAL HIGH (ref 5–15)
BUN: 78 mg/dL — ABNORMAL HIGH (ref 6–20)
CO2: 21 mmol/L — ABNORMAL LOW (ref 22–32)
Calcium: 7.6 mg/dL — ABNORMAL LOW (ref 8.9–10.3)
Chloride: 96 mmol/L — ABNORMAL LOW (ref 98–111)
Creatinine, Ser: 10.76 mg/dL — ABNORMAL HIGH (ref 0.61–1.24)
GFR, Estimated: 5 mL/min — ABNORMAL LOW (ref >60)
Glucose, Bld: 110 mg/dL — ABNORMAL HIGH (ref 70–99)
Phosphorus: 7.4 mg/dL — ABNORMAL HIGH (ref 2.5–4.6)
Potassium: 5.2 mmol/L — ABNORMAL HIGH (ref 3.5–5.1)
Sodium: 138 mmol/L (ref 135–145)

## 2022-12-18 LAB — HEPATITIS B SURFACE ANTIGEN: Hepatitis B Surface Ag: NONREACTIVE

## 2022-12-18 LAB — TROPONIN I (HIGH SENSITIVITY): Troponin I (High Sensitivity): 142 ng/L (ref <18)

## 2022-12-18 LAB — HEPATITIS PANEL, ACUTE
HCV Ab: NONREACTIVE
Hep A IgM: NONREACTIVE
Hep B C IgM: NONREACTIVE
Hepatitis B Surface Ag: NONREACTIVE

## 2022-12-18 MED ORDER — ANTICOAGULANT SODIUM CITRATE 4% (200MG/5ML) IV SOLN: 5.0000 mL | Status: DC | PRN

## 2022-12-18 MED ORDER — LIDOCAINE-PRILOCAINE 2.5-2.5 % EX CREA: 1.0000 | TOPICAL_CREAM | CUTANEOUS | Status: DC | PRN

## 2022-12-18 MED ORDER — HEPARIN SODIUM (PORCINE) 1000 UNIT/ML DIALYSIS: 1000.0000 [IU] | INTRAMUSCULAR | Status: DC | PRN

## 2022-12-18 MED ORDER — ALTEPLASE 2 MG IJ SOLR: 2.0000 mg | Freq: Once | INTRAMUSCULAR | Status: DC | PRN

## 2022-12-18 MED ORDER — CHLORHEXIDINE GLUCONATE CLOTH 2 % EX PADS: 6.0000 | MEDICATED_PAD | Freq: Every day | CUTANEOUS | Status: DC

## 2022-12-18 MED ORDER — LIDOCAINE HCL (PF) 1 % IJ SOLN: 5.0000 mL | INTRAMUSCULAR | Status: DC | PRN

## 2022-12-18 MED ORDER — HEPARIN SODIUM (PORCINE) 1000 UNIT/ML DIALYSIS
2000.0000 [IU] | INTRAMUSCULAR | Status: DC | PRN
  Filled 2022-12-18: qty 2

## 2022-12-18 MED ORDER — PENTAFLUOROPROP-TETRAFLUOROETH EX AERO: 1.0000 | INHALATION_SPRAY | CUTANEOUS | Status: DC | PRN

## 2022-12-18 MED ORDER — ONDANSETRON 4 MG PO TBDP
4.0000 mg | ORAL_TABLET | Freq: Once | ORAL | Status: DC
  Filled 2022-12-18: qty 1

## 2022-12-18 NOTE — ED Notes (Signed)
Patient refused any other treatment or test

## 2022-12-18 NOTE — ED Notes (Signed)
Pt would not respond when talked to and would not move his body. remained under blanket with his head covered when trying to get vitals.

## 2022-12-18 NOTE — ED Provider Notes (Signed)
[place] EMERGENCY DEPARTMENT AT [facility] Provider Note   CSN: [id number] Arrival date & time: 12/17/22  1444     History  Chief Complaint  Patient presents with   Abdominal Pain    [name] is a 52 y.o. male.  With past medical history of type 2 diabetes, hyperlipidemia, end-stage renal disease on hemodialysis Tuesday, Thursday, Saturday, hypertension, housing instability who presents back to the emergency department after hemodialysis.  He was seen yesterday afternoon, 12/17/2022 in the emergency department for abdominal pain.  At that time stated that he had only received a partial dialysis treatment on Saturday and was unable to get it yesterday.  Was having "slight chest pain that felt like his normal."  He had a workup performed which showed his renal failure.  He did have a troponin of 142 which is similar to his baseline.  Per the previous provider, I felt his presentation was likely related to fluid volume overload.  They coordinated care with nephrology and patient received his dialysis early this morning.  Per the previous provider's note, patient did have an episode of vomiting in the emergency department that was brown in color.  Noted no hematemesis.  His abdomen was soft and nontender.  They had recommended a CT to evaluate further but the patient had refused.      Abdominal Pain      Home Medications Prior to Admission medications   Medication Sig Start Date End Date Taking? Authorizing Provider  acetaminophen (TYLENOL) 500 MG tablet Take 1 tablet (500 mg total) by mouth every 8 (eight) hours as needed for mild pain. 05/28/22   Leroy Sea, MD  ASPIRIN LOW DOSE 81 MG tablet Take 1 tablet (81 mg total) by mouth daily. 05/28/22   Leroy Sea, MD  benzonatate (TESSALON) 100 MG capsule Take 1 capsule (100 mg total) by mouth every 8 (eight) hours. 09/09/22   Sabas Sous, MD  calcitRIOL (ROCALTROL) 0.25 MCG capsule Take 1 capsule (0.25 mcg  total) by mouth every Tuesday, Thursday, and Saturday at 6 PM. 10/26/22   Ghimire, Werner Lean, MD  calcium acetate (PHOSLO) 667 MG capsule Take 3 capsules (2,001 mg total) by mouth 3 (three) times daily. 05/28/22   Leroy Sea, MD  calcium acetate (PHOSLO) 667 MG tablet Take 3 tablets (2,001 mg total) by mouth 3 (three) times daily. 12/12/22     carvedilol (COREG) 25 MG tablet Take 1 tablet (25 mg total) by mouth 2 (two) times daily with a meal. 10/26/22   Ghimire, Werner Lean, MD  carvedilol (COREG) 25 MG tablet Take 1 tablet by mouth twice a day 12/12/22     glucose blood test strip Use to check fasting blood sugar once daily. 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 10/26/22   [provider]  insulin aspart (NOVOLOG FLEXPEN) 100 UNIT/ML FlexPen 0-9 Units, Subcutaneous, 3 times daily with meals CBG < 70: Implement Hypoglycemia measures CBG 70 - 120: 0 units CBG 121 - 150: 1 unit CBG 151 - 200: 2 units CBG 201 - 250: 3 units CBG 251 - 300: 5 units CBG 301 - 350: 7 units CBG 351 - 400: 9 units CBG > 400: call MD 10/26/22   Maretta Bees, MD  insulin glargine-yfgn (SEMGLEE) 100 UNIT/ML injection Inject 0.07 mLs (7 Units total) into the skin at bedtime. 10/26/22   Ghimire, [employer]  M, MD  Insulin Pen Needle 32G X 4 MM MISC Use with insulin pens 05/28/22   Leroy Sea, MD  isosorbide mononitrate (IMDUR) 60 MG 24 hr tablet Take 1 tablet (60 mg total) by mouth daily. 10/26/22   Ghimire, Werner Lean, MD  isosorbide mononitrate (IMDUR) 60 MG 24 hr tablet Take 1 tablet (60 mg total) by mouth Nightly. 12/12/22     melatonin 3 MG TABS tablet Take 1 tablet (3 mg total) by mouth at bedtime as needed (insomnia). 10/26/22   Ghimire, Werner Lean, MD  oxyCODONE-acetaminophen (PERCOCET/ROXICET) 5-325 MG tablet Take 1 tablet by mouth every 12 (twelve) hours as needed for severe pain. 10/26/22   Ghimire, Werner Lean, MD  PROTONIX 40 MG  tablet Take 1 tablet (40 mg total) by mouth daily. 05/25/22   Leroy Sea, MD  simethicone (MYLICON) 80 MG chewable tablet Chew 1 tablet (80 mg total) by mouth 4 (four) times daily for 5 days. 11/22/22 12/17/22  Rodolph Bong, MD  gabapentin (NEURONTIN) 300 MG capsule Take 1 capsule (300 mg total) by mouth 3 (three) times daily. Patient not taking: Reported on 07/27/2019 07/29/18 08/30/19  Kallie Locks, FNP  sildenafil (VIAGRA) 25 MG tablet Take 1 tablet (25 mg total) by mouth as needed for erectile dysfunction. for erectile dysfunction. Patient not taking: Reported on 08/11/2020 10/13/19 11/27/20  Kallie Locks, FNP      Allergies    Sulfa antibiotics, Other, Bactrim [sulfamethoxazole-trimethoprim], and Cephalexin    Review of Systems   Review of Systems  Gastrointestinal:  Positive for abdominal distention and abdominal pain.    Physical Exam Updated Vital Signs BP (!) 163/83   Pulse 87   Temp 98.2 F (36.8 C)   Resp 10   Ht 5\' 7"  (1.702 m)   Wt 71.2 kg   SpO2 100%   BMI 24.59 kg/m  Physical Exam Vitals and nursing note reviewed.  Constitutional:      General: He is not in acute distress.    Appearance: He is ill-appearing.     Comments: Chronically ill appearing   HENT:     Head: Normocephalic and atraumatic.  Eyes:     General: No scleral icterus. Pulmonary:     Effort: Pulmonary effort is normal. No respiratory distress.  Abdominal:     General: Abdomen is protuberant. Bowel sounds are normal. There is distension.     Tenderness: There is no abdominal tenderness.  Skin:    General: Skin is dry.     Findings: No rash.  Neurological:     General: No focal deficit present.     Mental Status: He is alert and oriented to person, place, and time.  Psychiatric:        Mood and Affect: Mood normal.        Behavior: Behavior normal.        Thought Content: Thought content normal.        Judgment: Judgment normal.     ED Results / Procedures / Treatments    Labs (all labs ordered are listed, but only abnormal results are displayed) Labs Reviewed  COMPREHENSIVE METABOLIC PANEL - Abnormal; Notable for the following components:      Result Value   Potassium 5.2 (*)    CO2 20 (*)    Glucose, Bld 118 (*)    BUN 77 (*)    Creatinine, Ser 10.88 (*)    Calcium 7.7 (*)    Albumin 3.4 (*)  Alkaline Phosphatase 244 (*)    GFR, Estimated 5 (*)    Anion gap 20 (*)    All other components within normal limits  CBC - Abnormal; Notable for the following components:   Hemoglobin 12.9 (*)    MCH 24.8 (*)    MCHC 29.0 (*)    RDW 19.7 (*)    nRBC 0.3 (*)    All other components within normal limits  RENAL FUNCTION PANEL - Abnormal; Notable for the following components:   Potassium 5.2 (*)    Chloride 96 (*)    CO2 21 (*)    Glucose, Bld 110 (*)    BUN 78 (*)    Creatinine, Ser 10.76 (*)    Calcium 7.6 (*)    Phosphorus 7.4 (*)    Albumin 3.2 (*)    GFR, Estimated 5 (*)    Anion gap 21 (*)    All other components within normal limits  CBC - Abnormal; Notable for the following components:   Hemoglobin 12.3 (*)    MCH 25.0 (*)    RDW 19.5 (*)    All other components within normal limits  CBG MONITORING, ED - Abnormal; Notable for the following components:   Glucose-Capillary 130 (*)    All other components within normal limits  TROPONIN I (HIGH SENSITIVITY) - Abnormal; Notable for the following components:   Troponin I (High Sensitivity) 105 (*)    All other components within normal limits  TROPONIN I (HIGH SENSITIVITY) - Abnormal; Notable for the following components:   Troponin I (High Sensitivity) 142 (*)    All other components within normal limits  LIPASE, BLOOD  HEPATITIS B SURFACE ANTIGEN  OCCULT BLOOD GASTRIC / DUODENUM (SPECIMEN CUP)  HEPATITIS B SURFACE ANTIBODY, QUANTITATIVE  HEPATITIS PANEL, ACUTE    EKG EKG Interpretation Date/Time:  Tuesday December 17 2022 17:11:52 EDT Ventricular Rate:  82 PR Interval:  144 QRS  Duration:  142 QT Interval:  462 QTC Calculation: 539 R Axis:   142  Text Interpretation: Normal sinus rhythm Biatrial enlargement Non-specific intra-ventricular conduction block Possible Lateral infarct , age undetermined T wave abnormality, consider inferior ischemia Abnormal ECG Confirmed by Tilden Fossa [phone]) on 12/18/2022 2:06:29 AM  Radiology DG Abdomen Acute W/Chest  Result Date: 12/18/2022 CLINICAL DATA:  Shortness of breath and abdominal pain. EXAM: DG ABDOMEN ACUTE WITH 1 VIEW CHEST COMPARISON:  12/05/2022. FINDINGS: There is no evidence of dilated bowel loops or free intraperitoneal air. Relative paucity of bowel gas is noted. No radiopaque calculi or other significant radiographic abnormality is seen. Heart is enlarged and mediastinal contours are stable. The pulmonary vasculature is prominent. A few scattered opacities are noted in the lungs bilaterally, most pronounced at the left lung base. No effusion or pneumothorax. Rib fracture is present at the T6 rib on the right. There is a fracture of the distal left clavicle. IMPRESSION: 1. Cardiomegaly with mildly distended pulmonary vasculature. 2. Scattered opacities in the lungs bilaterally, most pronounced at the left lung base, possible atelectasis, edema, or infiltrate. 3. Nonobstructive bowel-gas pattern.  No free air. Electronically Signed   By: Thornell Sartorius M.D.   On: 12/18/2022 01:56    Procedures Procedures   Medications Ordered in ED Medications  Chlorhexidine Gluconate Cloth 2 % PADS 6 each (has no administration in time range)  ondansetron (ZOFRAN-ODT) disintegrating tablet 8 mg (8 mg Oral Given 12/18/22 0030)    ED Course/ Medical Decision Making/ A&P    Medical Decision Making Amount  and/or Complexity of Data Reviewed Labs: ordered. Radiology: ordered.  Risk Prescription drug management.    Patient presents back from dialysis. He states he feels improved since his initial presentation in the emergency  department. He no longer feels nauseated. He does still feel like his abdomen is distended, but he is refusing further evaluation at this time. States that he will come back if his symptoms get worse. Will discharge him. Previous provider had recommended further evaluation with CT scan which I think is appropriate. I discussed desire for ongoing evaluation but patient would like to leave. Of note, there are previous ED notes where patient has discussed abdominal swelling. This may be more of a chronic issue, but I have given him strict return precautions for worsening symptoms. He verbalized understanding.   Final Clinical Impression(s) / ED Diagnoses Final diagnoses:  Nausea and vomiting, unspecified vomiting type  Shortness of breath    Rx / DC Orders ED Discharge Orders     None         Cristopher Peru, PA-C 12/18/22 [id number]    Coral Spikes, DO 12/18/22 1637

## 2022-12-18 NOTE — ED Provider Notes (Signed)
[facility] AT [facility] Provider Note   CSN: [id number] Arrival date & time: 12/17/22  1444     History  Chief Complaint  Patient presents with   Abdominal Pain    [name] is a 52 y.o. male.  HPI   Patient with medical history including type 2 diabetes, hyperlipidemia, hypertension, end-stage renal disease on dialysis Tuesday Thursday Saturday, housing instability, presenting with shortness of breath nausea vomiting, states it started today, states that he believes is from his dialysis.  He only got partial of his dialysis treatment on Saturday and then was unable to get it today.  States that he does have slight chest pain, feels like his normal.  Patient is a very difficult historian and refuses to fully cooperate during HPI.  I reviewed his charts patient has been seen multiple times for similar presentations, due to housing instability patient has difficulty obtaining dialysis treatments, frequently comes here for dialysis, most recently seen on the 28th, had a benign exam was later discharged home.  Home Medications Prior to Admission medications   Medication Sig Start Date End Date Taking? Authorizing Provider  acetaminophen (TYLENOL) 500 MG tablet Take 1 tablet (500 mg total) by mouth every 8 (eight) hours as needed for mild pain. 05/28/22   Leroy Sea, MD  ASPIRIN LOW DOSE 81 MG tablet Take 1 tablet (81 mg total) by mouth daily. 05/28/22   Leroy Sea, MD  benzonatate (TESSALON) 100 MG capsule Take 1 capsule (100 mg total) by mouth every 8 (eight) hours. 09/09/22   Sabas Sous, MD  calcitRIOL (ROCALTROL) 0.25 MCG capsule Take 1 capsule (0.25 mcg total) by mouth every Tuesday, Thursday, and Saturday at 6 PM. 10/26/22   Ghimire, Werner Lean, MD  calcium acetate (PHOSLO) 667 MG capsule Take 3 capsules (2,001 mg total) by mouth 3 (three) times daily. 05/28/22   Leroy Sea, MD  calcium acetate (PHOSLO) 667 MG tablet Take 3  tablets (2,001 mg total) by mouth 3 (three) times daily. 12/12/22     carvedilol (COREG) 25 MG tablet Take 1 tablet (25 mg total) by mouth 2 (two) times daily with a meal. 10/26/22   Ghimire, Werner Lean, MD  carvedilol (COREG) 25 MG tablet Take 1 tablet by mouth twice a day 12/12/22     glucose blood test strip Use to check fasting blood sugar once daily. 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 10/26/22   [provider]  insulin aspart (NOVOLOG FLEXPEN) 100 UNIT/ML FlexPen 0-9 Units, Subcutaneous, 3 times daily with meals CBG < 70: Implement Hypoglycemia measures CBG 70 - 120: 0 units CBG 121 - 150: 1 unit CBG 151 - 200: 2 units CBG 201 - 250: 3 units CBG 251 - 300: 5 units CBG 301 - 350: 7 units CBG 351 - 400: 9 units CBG > 400: call MD 10/26/22   Maretta Bees, MD  insulin glargine-yfgn (SEMGLEE) 100 UNIT/ML injection Inject 0.07 mLs (7 Units total) into the skin at bedtime. 10/26/22   Ghimire, Werner Lean, MD  Insulin Pen Needle 32G X 4 MM MISC Use with insulin pens 05/28/22   Leroy Sea, MD  isosorbide mononitrate (IMDUR) 60 MG 24 hr tablet Take 1 tablet (60 mg total) by mouth daily. 10/26/22   Ghimire, Werner Lean, MD  isosorbide mononitrate (IMDUR) 60 MG 24 hr tablet Take 1 tablet (  60 mg total) by mouth Nightly. 12/12/22     melatonin 3 MG TABS tablet Take 1 tablet (3 mg total) by mouth at bedtime as needed (insomnia). 10/26/22   Ghimire, Werner Lean, MD  oxyCODONE-acetaminophen (PERCOCET/ROXICET) 5-325 MG tablet Take 1 tablet by mouth every 12 (twelve) hours as needed for severe pain. 10/26/22   Ghimire, Werner Lean, MD  PROTONIX 40 MG tablet Take 1 tablet (40 mg total) by mouth daily. 05/25/22   Leroy Sea, MD  simethicone (MYLICON) 80 MG chewable tablet Chew 1 tablet (80 mg total) by mouth 4 (four) times daily for 5 days. 11/22/22 12/17/22  Rodolph Bong, MD  gabapentin (NEURONTIN) 300 MG capsule Take 1  capsule (300 mg total) by mouth 3 (three) times daily. Patient not taking: Reported on 07/27/2019 07/29/18 08/30/19  Kallie Locks, FNP  sildenafil (VIAGRA) 25 MG tablet Take 1 tablet (25 mg total) by mouth as needed for erectile dysfunction. for erectile dysfunction. Patient not taking: Reported on 08/11/2020 10/13/19 11/27/20  Kallie Locks, FNP      Allergies    Sulfa antibiotics, Other, Bactrim [sulfamethoxazole-trimethoprim], and Cephalexin    Review of Systems   Review of Systems  Constitutional:  Negative for chills and fever.  Respiratory:  Positive for shortness of breath.   Cardiovascular:  Negative for chest pain.  Gastrointestinal:  Positive for nausea and vomiting. Negative for abdominal pain.  Neurological:  Negative for headaches.    Physical Exam Updated Vital Signs BP (!) 207/118 (BP Location: Left Arm)   Pulse 92   Temp 97.7 F (36.5 C) (Oral)   Resp 18   Ht 5\' 7"  (1.702 m)   Wt 71.2 kg   SpO2 100%   BMI 24.59 kg/m  Physical Exam Vitals and nursing note reviewed.  Constitutional:      General: He is not in acute distress.    Appearance: He is not ill-appearing.  HENT:     Head: Normocephalic and atraumatic.     Nose: No congestion.  Eyes:     Conjunctiva/sclera: Conjunctivae normal.  Cardiovascular:     Rate and Rhythm: Normal rate and regular rhythm.     Pulses: Normal pulses.     Heart sounds: No murmur heard.    No friction rub. No gallop.  Pulmonary:     Effort: No respiratory distress.     Breath sounds: Rales present. No wheezing or rhonchi.     Comments: Bibasilar Rales heard in the lower lobes bilaterally.  No new oxygen requirements. Abdominal:     General: There is distension.     Tenderness: There is no abdominal tenderness. There is no right CVA tenderness or left CVA tenderness.     Comments: Abdomen is distended, dull to percussion, nontender on my examination.  Musculoskeletal:     Right lower leg: Edema present.     Left lower  leg: Edema present.     Comments: 2+ pitting edema to legs bilaterally.  Skin:    General: Skin is warm and dry.     Comments: Fistula noted on the right arm, no evidence of overlying skin infection, good palpable thrill.  Neurological:     Mental Status: He is alert.  Psychiatric:        Mood and Affect: Mood normal.     ED Results / Procedures / Treatments   Labs (all labs ordered are listed, but only abnormal results are displayed) Labs Reviewed  COMPREHENSIVE METABOLIC PANEL - Abnormal; Notable  for the following components:      Result Value   Potassium 5.2 (*)    CO2 20 (*)    Glucose, Bld 118 (*)    BUN 77 (*)    Creatinine, Ser 10.88 (*)    Calcium 7.7 (*)    Albumin 3.4 (*)    Alkaline Phosphatase 244 (*)    GFR, Estimated 5 (*)    Anion gap 20 (*)    All other components within normal limits  CBC - Abnormal; Notable for the following components:   Hemoglobin 12.9 (*)    MCH 24.8 (*)    MCHC 29.0 (*)    RDW 19.7 (*)    nRBC 0.3 (*)    All other components within normal limits  RENAL FUNCTION PANEL - Abnormal; Notable for the following components:   Potassium 5.2 (*)    Chloride 96 (*)    CO2 21 (*)    Glucose, Bld 110 (*)    BUN 78 (*)    Creatinine, Ser 10.76 (*)    Calcium 7.6 (*)    Phosphorus 7.4 (*)    Albumin 3.2 (*)    GFR, Estimated 5 (*)    Anion gap 21 (*)    All other components within normal limits  CBC - Abnormal; Notable for the following components:   Hemoglobin 12.3 (*)    MCH 25.0 (*)    RDW 19.5 (*)    All other components within normal limits  CBG MONITORING, ED - Abnormal; Notable for the following components:   Glucose-Capillary 130 (*)    All other components within normal limits  TROPONIN I (HIGH SENSITIVITY) - Abnormal; Notable for the following components:   Troponin I (High Sensitivity) 105 (*)    All other components within normal limits  TROPONIN I (HIGH SENSITIVITY) - Abnormal; Notable for the following components:    Troponin I (High Sensitivity) 142 (*)    All other components within normal limits  LIPASE, BLOOD  HEPATITIS B SURFACE ANTIGEN  OCCULT BLOOD GASTRIC / DUODENUM (SPECIMEN CUP)  HEPATITIS B SURFACE ANTIBODY, QUANTITATIVE  HEPATITIS PANEL, ACUTE    EKG EKG Interpretation Date/Time:  Tuesday December 17 2022 17:11:52 EDT Ventricular Rate:  82 PR Interval:  144 QRS Duration:  142 QT Interval:  462 QTC Calculation: 539 R Axis:   142  Text Interpretation: Normal sinus rhythm Biatrial enlargement Non-specific intra-ventricular conduction block Possible Lateral infarct , age undetermined T wave abnormality, consider inferior ischemia Abnormal ECG Confirmed by Tilden Fossa [id number]) on 12/18/2022 2:06:29 AM  Radiology DG Abdomen Acute W/Chest  Result Date: 12/18/2022 CLINICAL DATA:  Shortness of breath and abdominal pain. EXAM: DG ABDOMEN ACUTE WITH 1 VIEW CHEST COMPARISON:  12/05/2022. FINDINGS: There is no evidence of dilated bowel loops or free intraperitoneal air. Relative paucity of bowel gas is noted. No radiopaque calculi or other significant radiographic abnormality is seen. Heart is enlarged and mediastinal contours are stable. The pulmonary vasculature is prominent. A few scattered opacities are noted in the lungs bilaterally, most pronounced at the left lung base. No effusion or pneumothorax. Rib fracture is present at the T6 rib on the right. There is a fracture of the distal left clavicle. IMPRESSION: 1. Cardiomegaly with mildly distended pulmonary vasculature. 2. Scattered opacities in the lungs bilaterally, most pronounced at the left lung base, possible atelectasis, edema, or infiltrate. 3. Nonobstructive bowel-gas pattern.  No free air. Electronically Signed   By: Thornell Sartorius M.D.   On: 12/18/2022  01:56    Procedures Procedures    Medications Ordered in ED Medications  Chlorhexidine Gluconate Cloth 2 % PADS 6 each (has no administration in time range)   pentafluoroprop-tetrafluoroeth (GEBAUERS) aerosol 1 Application (has no administration in time range)  lidocaine (PF) (XYLOCAINE) 1 % injection 5 mL (has no administration in time range)  lidocaine-prilocaine (EMLA) cream 1 Application (has no administration in time range)  anticoagulant sodium citrate solution 5 mL (has no administration in time range)  alteplase (CATHFLO ACTIVASE) injection 2 mg (has no administration in time range)  heparin injection 2,000 Units (has no administration in time range)  ondansetron (ZOFRAN-ODT) disintegrating tablet 8 mg (8 mg Oral Given 12/18/22 0030)    ED Course/ Medical Decision Making/ A&P                                 Medical Decision Making Amount and/or Complexity of Data Reviewed Labs: ordered. Radiology: ordered.  Risk Prescription drug management.   This patient presents to the ED for concern of shortness of breath, nausea vomiting, this involves an extensive number of treatment options, and is a complaint that carries with it a high risk of complications and morbidity.  The differential diagnosis includes ACS, CHF, volume overload, metabolic abnormality    Additional history obtained:  Additional history obtained from N/A External records from outside source obtained and reviewed including recent ER notes   Co morbidities that complicate the patient evaluation  End-stage renal disease, diabetes,  Social Determinants of Health:  Housing instability    Lab Tests:  I Ordered, and personally interpreted labs.  The pertinent results include: CBC shows normocytic anemia hemoglobin of 12.9, CMP with a potassium of 5.2, CO2 of 20, glucose 118, BUN 77, creatinine 10.8, calcium 7.7, albumin 3.4, alk phos 244, GFR 5, and gap 20, second troponin is 142   Imaging Studies ordered:  I ordered imaging studies including acute chest abdomen I independently visualized and interpreted imaging which showed shows vascular congestion without  evidence of bowel perforation. I agree with the radiologist interpretation   Cardiac Monitoring:  The patient was maintained on a cardiac monitor.  I personally viewed and interpreted the cardiac monitored which showed an underlying rhythm of: Without signs of ischemia   Medicines ordered and prescription drug management:  I ordered medication including Zofran I have reviewed the patients home medicines and have made adjustments as needed  Critical Interventions:  N/A   Reevaluation:  Presents with nausea vomiting shortness of breath, my physical exam is extremely limited as patient refused to cooperate, he was found sitting in a chair, when I asked him to ambulate to bed he refused, when asked him to least lay back sucking fully examine his stomach and back he refused, stating that he just needs his dialysis.  Will obtain screening lab workup, chest x-ray, abdominal x-ray, suspect likely volume overload, will likely need dialysis treatment.  On repeat examination, patient had vomited, I personally evaluated patient's emesis was brown in color, no hematemesis, abdomen soft nontender, patient still will not fully cooperate during my exam only give me very limited information, patient has no history of GI bleeds, not on anticoag's, he did deny any bloody stools or dark tarry stools.  I recommend CT imaging for further evaluation but patient deferred as he does not want this.  I explained that I am concerned he might have a GI bleed which can be  life-threatening and he likely need further workup, still defers on any imaging but will allow him to check his emesis  for blood in it.  Will add on gastric Hemoccult, will also consult with nephrology for dialysis.  Consultations Obtained:  I requested consultation with the Dr. Ronalee Belts,  and discussed lab and imaging findings as well as pertinent plan - they recommend: He will set up dialysis for patient.    Test Considered:  CT abdomen  pelvis-further evaluation of stomach distention coffee-ground emesis, but patient refused.    Rule out Suspicion for ACS is low at this time presentation atypical, no active chest pain, EKG without evidence of ischemia, his troponin is slightly elevated but this is at his baseline, likely secondary to volume overload creating some demand ischemia.  Suspicion for PE is low at this time.  Nontachycardic, nontachypneic, no new oxygen requirements, does endorse shortness of breath but likely secondary due to volume overload as seen on chest x-ray.  I have low suspicion for liver or gallbladder abnormality as she has no right upper quadrant tenderness, liver enzymes,, T bili all within normal limits.  Patient does have a slightly elevated alk phos but this is at his baseline, without right upper quadrant tenderness my suspicion for biliary abnormality is low at this time. low suspicion for pancreatitis as lipase is within normal limits.  Low suspicion for ruptured stomach ulcer as she has no peritoneal sign present on exam.  Low suspicion for bowel obstruction as abdomen is nondistended normal bowel sounds,  passing gas and having normal bowel movements, x-rays negative for evidence of obstruction.  Low suspicion for complicated diverticulitis as she is nontoxic-appearing, vital signs reassuring no leukocytosis present.  Low suspicion for appendicitis as she has no right lower quadrant tenderness, vital signs reassuring.  Suspicion for GI bleed is lower at this time as his hemoglobin is actually above his baseline, he does have an elevated BUN but this could be secondary due to missed dialysis treatment, he is not endorsing bloody stools or dark tarry stools,, on anticoag, no history of stomach ulcers, will further assess with gastric Hemoccults.   Dispostion and problem list  Patient has been brought up to dialysis, anticipate his symptoms resolving after dialysis treatment, I would encourage patient to allow  CT imaging for further evaluation of abdominal distention and coffee-ground emesis, as well as gastric Hemoccult,if patient refused this I did have him sign out AMA.           Final Clinical Impression(s) / ED Diagnoses Final diagnoses:  Nausea and vomiting, unspecified vomiting type  Shortness of breath    Rx / DC Orders ED Discharge Orders     None         Carroll Sage, PA-C 12/18/22 0347    Tilden Fossa, MD 12/19/22 [phone]

## 2022-12-18 NOTE — Progress Notes (Addendum)
Pt seen by Dr Sunnie Nielsen  Pt c/o N&V, diarrhea, abd pain.   He is variable about how long it has been going on, as much as a week.  He denies seeing blood in the vomitus or in the stool  He has been in the ER several times recently for the same sx, has left AMA.  He says he has not been able to do the CT scan they wanted to do because his belly hurts more if he tries to lie flat and he gets more nauseated.  He says if he could get meds to treat those sx, he would be able to lie flat and get the scan.   No fevers or chills.  On Exam, his abd is distended, firm, decreased BS, not clearly tympanic.   He feels SOB, O2 sats are 88% on room air.   His BP is high, he says it runs that way.   After thorough evaluation, Dr Sunnie Nielsen feels the best option for him is EMS transport to the ER.  He was encouraged to express reasons he did not want a CT and allow staff to treat him.  Hopefully he will stay in the ER long enough to get a diagnosis and full treatment.  He seems to have a significant abdominal issue, definitive diagnosis and treatment is needed.   Theodore Demark, PA-C 12/18/2022 6:28 PM

## 2022-12-18 NOTE — ED Notes (Signed)
IV team present to start an US guided IV. Pt sitting on his walker in the hallway. Pt refusing to go back into room or get into bed. IV RN explained to pt that he is unable to place an IV with pt sitting in walker. Pt refused IV start. PA notified

## 2022-12-18 NOTE — ED Notes (Signed)
Pt minimally conversive with this RN. Laying on his side L side. 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

## 2022-12-18 NOTE — Progress Notes (Addendum)
Arrived to place PIV. Pt instructed to get onto the stretcher for IV assessment. Refused to get on stretcher. Unable to assess for IV in WC. Notified ED staff RN.

## 2022-12-18 NOTE — ED Notes (Signed)
Pt to dialysis.

## 2022-12-18 NOTE — Discharge Instructions (Addendum)
Follow up with your Physician for recheck  

## 2022-12-18 NOTE — Progress Notes (Signed)
   12/18/22 0745  Vitals  Temp 98.2 F (36.8 C)  Pulse Rate 87  Resp 10  BP (!) 163/83  SpO2 100 %  Post Treatment  Duration of HD Treatment -hour(s) 3.1 hour(s)  Hemodialysis Intake (mL) 0 mL  Liters Processed 54.9  Fluid Removed (mL) 3400 mL  Tolerated HD Treatment Yes  AVG/AVF Arterial Site Held (minutes) 10 minutes  AVG/AVF Venous Site Held (minutes) 10 minutes   Received patient in bed to unit.  Alert and oriented.  Informed consent signed and in chart.   TX duration:3.10 HRS  Patient tolerated well.  Transported back to the room  Alert, without acute distress.  Hand-off given to patient's nurse.   Access used: RAVG Access issues: NONE  Total UF removed: 3.4L Medication(s) given: NONE   Na'Shaminy T [facility]

## 2022-12-18 NOTE — ED Notes (Signed)
Pt taken to ultrasound

## 2022-12-18 NOTE — ED Triage Notes (Signed)
[facility] from [facility] with a CC of abd pain with n/v/d. Pt slow to follow direction, and will not answer many questions. Per ems pt was seen by homeless shelter MD that would like him to get a CT scan.Pt abd distended.

## 2022-12-18 NOTE — ED Notes (Signed)
Pt reports feeling weak. This Rn previously placed pt on monitoring devices which he promptly removed from himself. Pt requesting a BG check. PA at bedside. Pt declining to reposition so that pA can evaluate him. Pt minimally conversive to PA. This RN previously attempted to start an IV unsuccessfully. IV team to come try.

## 2022-12-18 NOTE — ED Notes (Signed)
Patient having dialysis done at this time.

## 2022-12-18 NOTE — Discharge Instructions (Signed)
You were seen in the emergency department for dialysis and abdominal pain. Please return for worsening symptoms.

## 2022-12-18 NOTE — ED Provider Notes (Signed)
[place] EMERGENCY DEPARTMENT AT [facility] Provider Note   CSN: [id number] Arrival date & time: 12/18/22  1701     History  Chief Complaint  Patient presents with   Abdominal Pain    [name] is a 52 y.o. male.  Patient presents to the emergency department requesting abdominal evaluation.  Patient reports he has been having abdominal pain and bloating.  Patient was discharged here earlier today after having dialysis.  The history is provided by the patient. No language interpreter was used.  Abdominal Pain Pain location:  Generalized Pain quality: aching   Pain radiates to:  Does not radiate Onset quality:  Gradual Timing:  Constant Progression:  Worsening Chronicity:  Recurrent Relieved by:  Nothing Worsened by:  Nothing Ineffective treatments:  None tried Associated symptoms: no anorexia        Home Medications Prior to Admission medications   Medication Sig Start Date End Date Taking? Authorizing Provider  acetaminophen (TYLENOL) 500 MG tablet Take 1 tablet (500 mg total) by mouth every 8 (eight) hours as needed for mild pain. 05/28/22   Leroy Sea, MD  ASPIRIN LOW DOSE 81 MG tablet Take 1 tablet (81 mg total) by mouth daily. 05/28/22   Leroy Sea, MD  benzonatate (TESSALON) 100 MG capsule Take 1 capsule (100 mg total) by mouth every 8 (eight) hours. 09/09/22   Sabas Sous, MD  calcitRIOL (ROCALTROL) 0.25 MCG capsule Take 1 capsule (0.25 mcg total) by mouth every Tuesday, Thursday, and Saturday at 6 PM. 10/26/22   Ghimire, Werner Lean, MD  calcium acetate (PHOSLO) 667 MG capsule Take 3 capsules (2,001 mg total) by mouth 3 (three) times daily. 05/28/22   Leroy Sea, MD  calcium acetate (PHOSLO) 667 MG tablet Take 3 tablets (2,001 mg total) by mouth 3 (three) times daily. 12/12/22     carvedilol (COREG) 25 MG tablet Take 1 tablet (25 mg total) by mouth 2 (two) times daily with a meal. 10/26/22   Ghimire, Werner Lean, MD  carvedilol (COREG)  25 MG tablet Take 1 tablet by mouth twice a day 12/12/22     glucose blood test strip Use to check fasting blood sugar once daily. 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 10/26/22   [provider]  insulin aspart (NOVOLOG FLEXPEN) 100 UNIT/ML FlexPen 0-9 Units, Subcutaneous, 3 times daily with meals CBG < 70: Implement Hypoglycemia measures CBG 70 - 120: 0 units CBG 121 - 150: 1 unit CBG 151 - 200: 2 units CBG 201 - 250: 3 units CBG 251 - 300: 5 units CBG 301 - 350: 7 units CBG 351 - 400: 9 units CBG > 400: call MD 10/26/22   Maretta Bees, MD  insulin glargine-yfgn (SEMGLEE) 100 UNIT/ML injection Inject 0.07 mLs (7 Units total) into the skin at bedtime. 10/26/22   Ghimire, Werner Lean, MD  Insulin Pen Needle 32G X 4 MM MISC Use with insulin pens 05/28/22   Leroy Sea, MD  isosorbide mononitrate (IMDUR) 60 MG 24 hr tablet Take 1 tablet (60 mg total) by mouth daily. 10/26/22   Ghimire, Werner Lean, MD  isosorbide mononitrate (IMDUR) 60 MG 24 hr tablet Take 1 tablet (60 mg total) by mouth Nightly. 12/12/22     melatonin 3 MG TABS tablet Take 1 tablet (3 mg total) by mouth at bedtime as needed (insomnia). 10/26/22   Ghimire,  Werner Lean, MD  oxyCODONE-acetaminophen (PERCOCET/ROXICET) 5-325 MG tablet Take 1 tablet by mouth every 12 (twelve) hours as needed for severe pain. 10/26/22   Ghimire, Werner Lean, MD  PROTONIX 40 MG tablet Take 1 tablet (40 mg total) by mouth daily. 05/25/22   Leroy Sea, MD  simethicone (MYLICON) 80 MG chewable tablet Chew 1 tablet (80 mg total) by mouth 4 (four) times daily for 5 days. 11/22/22 12/17/22  Rodolph Bong, MD  gabapentin (NEURONTIN) 300 MG capsule Take 1 capsule (300 mg total) by mouth 3 (three) times daily. Patient not taking: Reported on 07/27/2019 07/29/18 08/30/19  Kallie Locks, FNP  sildenafil (VIAGRA) 25 MG tablet Take 1 tablet (25 mg total) by mouth as needed for  erectile dysfunction. for erectile dysfunction. Patient not taking: Reported on 08/11/2020 10/13/19 11/27/20  Kallie Locks, FNP      Allergies    Sulfa antibiotics, Other, Bactrim [sulfamethoxazole-trimethoprim], and Cephalexin    Review of Systems   Review of Systems  Gastrointestinal:  Positive for abdominal pain. Negative for anorexia.  All other systems reviewed and are negative.   Physical Exam Updated Vital Signs BP (!) 182/88 (BP Location: Right Leg)   Pulse 88   Temp 98.9 F (37.2 C) (Oral)   Resp 18   Ht 5\' 7"  (1.702 m)   Wt 71 kg   SpO2 99%   BMI 24.52 kg/m  Physical Exam Vitals and nursing note reviewed.  Constitutional:      Appearance: He is well-developed.  HENT:     Head: Normocephalic.  Cardiovascular:     Rate and Rhythm: Normal rate.  Pulmonary:     Effort: Pulmonary effort is normal.  Abdominal:     General: There is no distension.     Tenderness: There is abdominal tenderness.  Musculoskeletal:        General: Normal range of motion.     Cervical back: Normal range of motion.  Neurological:     Mental Status: He is alert and oriented to person, place, and time.     ED Results / Procedures / Treatments   Labs (all labs ordered are listed, but only abnormal results are displayed) Labs Reviewed - No data to display  EKG None  Radiology US Abdomen Complete  Result Date: 12/18/2022 CLINICAL DATA:  Abdominal pain EXAM: ABDOMEN ULTRASOUND COMPLETE COMPARISON:  None Available. FINDINGS: Gallbladder: Gallbladder wall thickening up to 0.5 cm, nonspecific in the setting of ascites. No gallstones. No sonographic Murphy sign noted by sonographer. Common bile duct: Diameter: 0.4 cm Liver: No focal lesion identified. Increased parenchymal echogenicity. Portal vein is patent on color Doppler imaging with normal direction of blood flow towards the liver. IVC: No abnormality visualized. Pancreas: Visualized portion unremarkable. Spleen: Size and appearance  within normal limits. Right Kidney: Length: 10.3 cm. Increased cortical echogenicity. No mass or hydronephrosis visualized. Left Kidney: Length: 9.5 cm. Increased cortical echogenicity. No mass or hydronephrosis visualized. Abdominal aorta: No aneurysm visualized. Other findings: Moderate volume ascites throughout the abdomen and pelvis. IMPRESSION: 1. Moderate volume ascites throughout the abdomen and pelvis. 2. Hepatic steatosis. 3. Increased renal cortical echogenicity, as can be seen in medical renal disease. 4. Gallbladder wall thickening up to 0.5 cm, nonspecific in the setting of ascites. No gallstones. No sonographic Murphy sign noted by sonographer. Electronically Signed   By: Jearld Lesch M.D.   On: 12/18/2022 19:12   DG Abdomen Acute W/Chest  Result Date: 12/18/2022 CLINICAL DATA:  Shortness of  breath and abdominal pain. EXAM: DG ABDOMEN ACUTE WITH 1 VIEW CHEST COMPARISON:  12/05/2022. FINDINGS: There is no evidence of dilated bowel loops or free intraperitoneal air. Relative paucity of bowel gas is noted. No radiopaque calculi or other significant radiographic abnormality is seen. Heart is enlarged and mediastinal contours are stable. The pulmonary vasculature is prominent. A few scattered opacities are noted in the lungs bilaterally, most pronounced at the left lung base. No effusion or pneumothorax. Rib fracture is present at the T6 rib on the right. There is a fracture of the distal left clavicle. IMPRESSION: 1. Cardiomegaly with mildly distended pulmonary vasculature. 2. Scattered opacities in the lungs bilaterally, most pronounced at the left lung base, possible atelectasis, edema, or infiltrate. 3. Nonobstructive bowel-gas pattern.  No free air. Electronically Signed   By: Thornell Sartorius M.D.   On: 12/18/2022 01:56    Procedures Procedures    Medications Ordered in ED Medications - No data to display  ED Course/ Medical Decision Making/ A&P                                 Medical  Decision Making Patient complains of abdominal bloating and discomfort.  Patient has been seen multiple times and complained of abdominal pain he has been offered CT scan which he has refused.  Patient tells me that he has decided that he will have an ultrasound.  Amount and/or Complexity of Data Reviewed External Data Reviewed: notes.    Details: ED notes from earlier today reviewed Radiology: ordered and independent interpretation performed. Decision-making details documented in ED Course.    Details: ReSound shows ascites  Risk Risk Details: I asked patient to reconsider CT scan.  Patient refuses he states he does not want radiation.  Patient had an acute abdominal series done recently which showed no acute findings ultrasound today shows ascites.  Patient was just discharged after hemodialysis today.  Patient had negative hepatitis testing yesterday.  Patient at this time appears to be stable for discharge.           Final Clinical Impression(s) / ED Diagnoses Final diagnoses:  Generalized abdominal pain  Other ascites    Rx / DC Orders ED Discharge Orders     None      An After Visit Summary was printed and given to the patient.    Elson Areas, Cordelia Poche 12/18/22 2019    Glyn Ade, MD 12/19/22 [phone]

## 2022-12-18 NOTE — Care Plan (Signed)
Brief nephrology note: Labs and chart reviewed. Plan for HD early morning and go back to ER afterwards for re-evaluation. Discussed with ER physician and dialysis nurse.

## 2022-12-19 DIAGNOSIS — R0789 Other chest pain: Secondary | ICD-10-CM | POA: Diagnosis not present

## 2022-12-20 ENCOUNTER — Emergency Department (HOSPITAL_COMMUNITY): Payer: 59

## 2022-12-20 ENCOUNTER — Other Ambulatory Visit: Payer: Self-pay

## 2022-12-20 ENCOUNTER — Emergency Department (HOSPITAL_COMMUNITY)
Admission: EM | Admit: 2022-12-20 | Discharge: 2022-12-21 | Disposition: A | Discharge status: Home / Self Care | Payer: 59 | Attending: Emergency Medicine | Admitting: Emergency Medicine

## 2022-12-20 DIAGNOSIS — I10 Essential (primary) hypertension: Secondary | ICD-10-CM | POA: Diagnosis not present

## 2022-12-20 DIAGNOSIS — Z7984 Long term (current) use of oral hypoglycemic drugs: Secondary | ICD-10-CM | POA: Diagnosis not present

## 2022-12-20 DIAGNOSIS — Z992 Dependence on renal dialysis: Secondary | ICD-10-CM | POA: Diagnosis not present

## 2022-12-20 DIAGNOSIS — E1122 Type 2 diabetes mellitus with diabetic chronic kidney disease: Secondary | ICD-10-CM | POA: Insufficient documentation

## 2022-12-20 DIAGNOSIS — R0602 Shortness of breath: Secondary | ICD-10-CM | POA: Diagnosis not present

## 2022-12-20 DIAGNOSIS — Z7982 Long term (current) use of aspirin: Secondary | ICD-10-CM | POA: Insufficient documentation

## 2022-12-20 DIAGNOSIS — R072 Precordial pain: Secondary | ICD-10-CM | POA: Diagnosis not present

## 2022-12-20 DIAGNOSIS — Z794 Long term (current) use of insulin: Secondary | ICD-10-CM | POA: Diagnosis not present

## 2022-12-20 DIAGNOSIS — I213 ST elevation (STEMI) myocardial infarction of unspecified site: Secondary | ICD-10-CM | POA: Diagnosis not present

## 2022-12-20 DIAGNOSIS — R0789 Other chest pain: Secondary | ICD-10-CM | POA: Diagnosis not present

## 2022-12-20 DIAGNOSIS — R112 Nausea with vomiting, unspecified: Secondary | ICD-10-CM | POA: Diagnosis not present

## 2022-12-20 DIAGNOSIS — J90 Pleural effusion, not elsewhere classified: Secondary | ICD-10-CM | POA: Diagnosis not present

## 2022-12-20 DIAGNOSIS — R188 Other ascites: Secondary | ICD-10-CM | POA: Diagnosis not present

## 2022-12-20 DIAGNOSIS — N186 End stage renal disease: Secondary | ICD-10-CM | POA: Insufficient documentation

## 2022-12-20 DIAGNOSIS — I3139 Other pericardial effusion (noninflammatory): Secondary | ICD-10-CM | POA: Diagnosis not present

## 2022-12-20 DIAGNOSIS — I517 Cardiomegaly: Secondary | ICD-10-CM | POA: Diagnosis not present

## 2022-12-20 DIAGNOSIS — I499 Cardiac arrhythmia, unspecified: Secondary | ICD-10-CM | POA: Diagnosis not present

## 2022-12-20 DIAGNOSIS — R0902 Hypoxemia: Secondary | ICD-10-CM

## 2022-12-20 DIAGNOSIS — R079 Chest pain, unspecified: Secondary | ICD-10-CM | POA: Diagnosis not present

## 2022-12-20 LAB — CBC
HCT: 44.3 % (ref 39.0–52.0)
Hemoglobin: 12.9 g/dL — ABNORMAL LOW (ref 13.0–17.0)
MCH: 24.9 pg — ABNORMAL LOW (ref 26.0–34.0)
MCHC: 29.1 g/dL — ABNORMAL LOW (ref 30.0–36.0)
MCV: 85.5 fL (ref 80.0–100.0)
Platelets: 312 10*3/uL (ref 150–400)
RBC: 5.18 MIL/uL (ref 4.22–5.81)
RDW: 19.3 % — ABNORMAL HIGH (ref 11.5–15.5)
WBC: 7.2 10*3/uL (ref 4.0–10.5)
nRBC: 0.3 % — ABNORMAL HIGH (ref 0.0–0.2)

## 2022-12-20 LAB — BASIC METABOLIC PANEL
Anion gap: 19 — ABNORMAL HIGH (ref 5–15)
BUN: 44 mg/dL — ABNORMAL HIGH (ref 6–20)
CO2: 24 mmol/L (ref 22–32)
Calcium: 8.3 mg/dL — ABNORMAL LOW (ref 8.9–10.3)
Chloride: 95 mmol/L — ABNORMAL LOW (ref 98–111)
Creatinine, Ser: 8.44 mg/dL — ABNORMAL HIGH (ref 0.61–1.24)
GFR, Estimated: 7 mL/min — ABNORMAL LOW (ref >60)
Glucose, Bld: 141 mg/dL — ABNORMAL HIGH (ref 70–99)
Potassium: 4.6 mmol/L (ref 3.5–5.1)
Sodium: 138 mmol/L (ref 135–145)

## 2022-12-20 LAB — TROPONIN I (HIGH SENSITIVITY)
Troponin I (High Sensitivity): 195 ng/L (ref <18)
Troponin I (High Sensitivity): 207 ng/L (ref <18)

## 2022-12-20 LAB — PROTIME-INR
INR: 1.3 — ABNORMAL HIGH (ref 0.8–1.2)
Prothrombin Time: 16.4 seconds — ABNORMAL HIGH (ref 11.4–15.2)

## 2022-12-20 LAB — MAGNESIUM: Magnesium: 2.3 mg/dL (ref 1.7–2.4)

## 2022-12-20 LAB — LIPASE, BLOOD: Lipase: 27 U/L (ref 11–51)

## 2022-12-20 LAB — CBG MONITORING, ED: Glucose-Capillary: 151 mg/dL — ABNORMAL HIGH (ref 70–99)

## 2022-12-20 MED ORDER — IOHEXOL 350 MG/ML SOLN
75.0000 mL | Freq: Once | INTRAVENOUS | Status: AC | PRN
  Administered 2022-12-20: 75 mL via INTRAVENOUS

## 2022-12-20 NOTE — ED Triage Notes (Signed)
Patient arrived with EMS reports central chest pain today with mild SOB , no emesis or diaphoresis , hemodialysis treatment yesterday , received ASA 324 mg by EMS prior to arrival .

## 2022-12-20 NOTE — ED Provider Notes (Signed)
[place] EMERGENCY DEPARTMENT AT [facility] Provider Note   CSN: [id number] Arrival date & time: 12/20/22  2029     History {Add pertinent medical, surgical, social history, OB history to HPI:1} Chief Complaint  Patient presents with   Chest Pain    [name] is a 52 y.o. male.  52 year old male with history of ESRD on Tuesday, Thursday, Saturday IHD, hyperlipidemia, DM2, and nonischemic cardiomyopathy who presents emergency department with chest pain.  History limited due to patient not wanting to provide history.  Says that this morning he started having substernal chest pain.  Has difficulty characterizing it.  Says that it has resolved.  Says that he has been coughing and vomiting occasionally.  No fevers.  Has had leg swelling.  Went to dialysis yesterday and says that he has not missed any sessions.  Given 324 aspirin by EMS prior to arrival.        Home Medications Prior to Admission medications   Medication Sig Start Date End Date Taking? Authorizing Provider  acetaminophen (TYLENOL) 500 MG tablet Take 1 tablet (500 mg total) by mouth every 8 (eight) hours as needed for mild pain. 05/28/22   Leroy Sea, MD  ASPIRIN LOW DOSE 81 MG tablet Take 1 tablet (81 mg total) by mouth daily. 05/28/22   Leroy Sea, MD  benzonatate (TESSALON) 100 MG capsule Take 1 capsule (100 mg total) by mouth every 8 (eight) hours. 09/09/22   Sabas Sous, MD  calcitRIOL (ROCALTROL) 0.25 MCG capsule Take 1 capsule (0.25 mcg total) by mouth every Tuesday, Thursday, and Saturday at 6 PM. 10/26/22   Ghimire, Werner Lean, MD  calcium acetate (PHOSLO) 667 MG capsule Take 3 capsules (2,001 mg total) by mouth 3 (three) times daily. 05/28/22   Leroy Sea, MD  calcium acetate (PHOSLO) 667 MG tablet Take 3 tablets (2,001 mg total) by mouth 3 (three) times daily. 12/12/22     carvedilol (COREG) 25 MG tablet Take 1 tablet (25 mg total) by mouth 2 (two) times daily with a meal. 10/26/22    Ghimire, Werner Lean, MD  carvedilol (COREG) 25 MG tablet Take 1 tablet by mouth twice a day 12/12/22     glucose blood test strip Use to check fasting blood sugar once daily. 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 10/26/22   [provider]  insulin aspart (NOVOLOG FLEXPEN) 100 UNIT/ML FlexPen 0-9 Units, Subcutaneous, 3 times daily with meals CBG < 70: Implement Hypoglycemia measures CBG 70 - 120: 0 units CBG 121 - 150: 1 unit CBG 151 - 200: 2 units CBG 201 - 250: 3 units CBG 251 - 300: 5 units CBG 301 - 350: 7 units CBG 351 - 400: 9 units CBG > 400: call MD 10/26/22   Maretta Bees, MD  insulin glargine-yfgn (SEMGLEE) 100 UNIT/ML injection Inject 0.07 mLs (7 Units total) into the skin at bedtime. 10/26/22   Ghimire, Werner Lean, MD  Insulin Pen Needle 32G X 4 MM MISC Use with insulin pens 05/28/22   Leroy Sea, MD  isosorbide mononitrate (IMDUR) 60 MG 24 hr tablet Take 1 tablet (60 mg total) by mouth daily. 10/26/22   Ghimire, Werner Lean, MD  isosorbide mononitrate (IMDUR) 60 MG 24 hr tablet Take 1 tablet (60 mg total) by mouth Nightly. 12/12/22     melatonin 3 MG TABS tablet Take 1 tablet (  3 mg total) by mouth at bedtime as needed (insomnia). 10/26/22   Ghimire, Werner Lean, MD  oxyCODONE-acetaminophen (PERCOCET/ROXICET) 5-325 MG tablet Take 1 tablet by mouth every 12 (twelve) hours as needed for severe pain. 10/26/22   Ghimire, Werner Lean, MD  PROTONIX 40 MG tablet Take 1 tablet (40 mg total) by mouth daily. 05/25/22   Leroy Sea, MD  simethicone (MYLICON) 80 MG chewable tablet Chew 1 tablet (80 mg total) by mouth 4 (four) times daily for 5 days. 11/22/22 12/17/22  Rodolph Bong, MD  gabapentin (NEURONTIN) 300 MG capsule Take 1 capsule (300 mg total) by mouth 3 (three) times daily. Patient not taking: Reported on 07/27/2019 07/29/18 08/30/19  Kallie Locks, FNP  sildenafil (VIAGRA) 25 MG tablet Take 1  tablet (25 mg total) by mouth as needed for erectile dysfunction. for erectile dysfunction. Patient not taking: Reported on 08/11/2020 10/13/19 11/27/20  Kallie Locks, FNP      Allergies    Sulfa antibiotics, Other, Bactrim [sulfamethoxazole-trimethoprim], and Cephalexin    Review of Systems   Review of Systems  Physical Exam Updated Vital Signs There were no vitals taken for this visit. Physical Exam Vitals and nursing note reviewed.  Constitutional:      General: He is not in acute distress.    Appearance: He is well-developed.  HENT:     Head: Normocephalic and atraumatic.     Right Ear: External ear normal.     Left Ear: External ear normal.     Nose: Nose normal.  Eyes:     Extraocular Movements: Extraocular movements intact.     Conjunctiva/sclera: Conjunctivae normal.     Pupils: Pupils are equal, round, and reactive to light.  Cardiovascular:     Rate and Rhythm: Normal rate and regular rhythm.     Heart sounds: Normal heart sounds.  Pulmonary:     Effort: Pulmonary effort is normal. No respiratory distress.     Breath sounds: Normal breath sounds.  Abdominal:     General: There is distension.     Palpations: Abdomen is soft. There is no mass.     Tenderness: There is no abdominal tenderness. There is no guarding.  Musculoskeletal:     Cervical back: Normal range of motion and neck supple.     Right lower leg: Edema present.     Left lower leg: Edema present.     Comments: Fistula in right upper extremity with bruit and thrill  Skin:    General: Skin is warm and dry.  Neurological:     Mental Status: He is alert. Mental status is at baseline.  Psychiatric:        Mood and Affect: Mood normal.        Behavior: Behavior normal.     ED Results / Procedures / Treatments   Labs (all labs ordered are listed, but only abnormal results are displayed) Labs Reviewed  BASIC METABOLIC PANEL  CBC  PROTIME-INR  TROPONIN I (HIGH SENSITIVITY)     EKG None  Radiology No results found.  Procedures Procedures  {Document cardiac monitor, telemetry assessment procedure when appropriate:1}  Medications Ordered in ED Medications - No data to display  ED Course/ Medical Decision Making/ A&P   {   Click here for ABCD2, HEART and other calculatorsREFRESH Note before signing :1}  Medical Decision Making Amount and/or Complexity of Data Reviewed Labs: ordered. Radiology: ordered.   ***  {Document critical care time when appropriate:1} {Document review of labs and clinical decision tools ie heart score, Chads2Vasc2 etc:1}  {Document your independent review of radiology images, and any outside records:1} {Document your discussion with family members, caretakers, and with consultants:1} {Document social determinants of health affecting pt's care:1} {Document your decision making why or why not admission, treatments were needed:1} Final Clinical Impression(s) / ED Diagnoses Final diagnoses:  None    Rx / DC Orders ED Discharge Orders     None

## 2022-12-20 NOTE — ED Notes (Signed)
Pt refuses CT. States "Bed is too hard and hurts my back"

## 2022-12-21 NOTE — ED Provider Notes (Signed)
Blood pressure 118/71, pulse 80, temperature 98.1 F (36.7 C), temperature source Oral, resp. rate 16, SpO2 93%.  Assuming care from Dr. Eloise Harman.  In short, [name] is a 52 y.o. male with a chief complaint of Chest Pain .  Refer to the original H&P for additional details.  The current plan of care is to follow CTA and troponin.  01:47 AM  Patient feeling well on reassessment. No PE on CTA. Stable pericardial effusion. Suspect slightly elevated troponin is 2/2 ESRD. Advised obs admission for trending but patient not willing to stay in the hospital. Patient is not altered and has capacity to make this decision. No clear acute findings on CTA chest.     , [name], MD 12/21/22 [phone]

## 2022-12-21 NOTE — ED Notes (Signed)
Went to do an update on vitals, seen pt didn't have restrict extremity band, so I went to put one on, pt stated he didn't need one that he was leaving in 30 mins. Reported statement to RN

## 2022-12-23 ENCOUNTER — Other Ambulatory Visit: Payer: Self-pay

## 2022-12-23 ENCOUNTER — Ambulatory Visit (HOSPITAL_COMMUNITY)
Admission: EM | Admit: 2022-12-23 | Discharge: 2022-12-23 | Disposition: A | Discharge status: Home / Self Care | Payer: 59

## 2022-12-23 NOTE — ED Triage Notes (Signed)
Patient presents to [facility] for evaluation of abdominal pain every time he eats.  Has been seen in the ED previously for this.  Stomach is now tight, patient states he can't stand

## 2022-12-23 NOTE — ED Notes (Signed)
Patient is being discharged from the Urgent Care and sent to the Emergency Department via private vehicle . Per Dr Marlinda Mike, patient is in need of higher level of care due to firm belly, severe upper abdominal pain with a history of ulcers. Patient is aware and verbalizes understanding of plan of care.  Vitals:   12/23/22 1117  BP: (!) 155/82  Pulse: 82  Resp: 16  Temp: 98 F (36.7 C)  SpO2: 98%

## 2022-12-23 NOTE — ED Provider Notes (Signed)
Patient seen briefly in triage.  He comes in today stating that his abdomen and chest are hurting.  Then he says that it is been hurting and hurting has remained for some time. Then he states that it only hurts when he eats.  It has been difficult.  He mentions a cough, but apparently that is chronic  He was seen here in the emergency room 1 week ago for possible coffee-ground emesis.  He is alert.  No acute respiratory distress.  Heart rate is 90.  Blood pressure is 155/104.  His abdomen is distended and tight.  I have asked him to proceed to the emergency room.  He declines transport, States He Will Drive, but First he States He Would Really like like [place] Just to Give Him Some Medicine to Make Everything Better Here in This Clinic.  I Discussed with Him That He Needs Urgent Evaluation Due To His Complaint and Exam.     Zenia Resides, MD 12/23/22 1116

## 2022-12-30 ENCOUNTER — Encounter (HOSPITAL_COMMUNITY): Payer: Self-pay | Admitting: Emergency Medicine

## 2022-12-30 ENCOUNTER — Other Ambulatory Visit: Payer: Self-pay

## 2022-12-30 ENCOUNTER — Emergency Department (HOSPITAL_COMMUNITY)
Admission: EM | Admit: 2022-12-30 | Discharge: 2022-12-31 | Discharge status: Against Medical Advice | Payer: 59 | Attending: Emergency Medicine | Admitting: Emergency Medicine

## 2022-12-30 DIAGNOSIS — Z5321 Procedure and treatment not carried out due to patient leaving prior to being seen by health care provider: Secondary | ICD-10-CM | POA: Diagnosis not present

## 2022-12-30 DIAGNOSIS — R0602 Shortness of breath: Secondary | ICD-10-CM | POA: Insufficient documentation

## 2022-12-30 DIAGNOSIS — R0600 Dyspnea, unspecified: Secondary | ICD-10-CM

## 2022-12-30 DIAGNOSIS — I10 Essential (primary) hypertension: Secondary | ICD-10-CM | POA: Diagnosis not present

## 2022-12-30 NOTE — ED Provider Triage Note (Signed)
Emergency Medicine Provider Triage Evaluation Note  [name] , a 52 y.o. male  was evaluated in triage.  Pt complains of SHOB, onset just PTA, brought in by EMS.  Attends dialysis Tuesday, Thursday, Saturday, last full session on Saturday. No hx chronic lung disease. States dialysis told him on Saturday that he might need fluid drained out of his abdomen, he has never had this before, no diagnosis of cirrhosis. No fevers.   Review of Systems  Positive:  Negative:   Physical Exam  BP (!) 160/100 (BP Location: Right Arm)   Pulse 78   Temp 97.8 F (36.6 C) (Oral)   Resp 17   SpO2 98%  Gen:   Awake, no distress   Resp:  Normal effort  MSK:   Moves extremities without difficulty  Other:  Lungs CTA, no wheezing  Medical Decision Making  Medically screening exam initiated at 11:51 PM.  Appropriate orders placed.  [name] was informed that the remainder of the evaluation will be completed by another provider, this initial triage assessment does not replace that evaluation, and the importance of remaining in the ED until their evaluation is complete.     Douglas Fend, PA-C 12/30/22 2354

## 2022-12-30 NOTE — ED Triage Notes (Signed)
Pt BIB EMS.  uncooperative with questioning.  Pt told EMS he could not walk and was shob.

## 2022-12-31 NOTE — ED Notes (Signed)
Patient ambulated to bathroom.

## 2022-12-31 NOTE — ED Notes (Signed)
Upon assessment by this RN patient is staying quiet and refusing to talk/answer questions.

## 2022-12-31 NOTE — ED Notes (Signed)
Patient refused to be taken to xray.

## 2022-12-31 NOTE — ED Notes (Addendum)
Pt refuses to turn over to get EKG

## 2022-12-31 NOTE — ED Notes (Signed)
Patient refused to get d/c vital signs and sign AMA form.

## 2023-01-02 ENCOUNTER — Encounter (HOSPITAL_COMMUNITY): Payer: Self-pay | Admitting: Emergency Medicine

## 2023-01-02 ENCOUNTER — Other Ambulatory Visit: Payer: Self-pay

## 2023-01-02 ENCOUNTER — Emergency Department (HOSPITAL_COMMUNITY)
Admission: EM | Admit: 2023-01-02 | Discharge: 2023-01-02 | Discharge status: Against Medical Advice | Payer: 59 | Attending: Emergency Medicine | Admitting: Emergency Medicine

## 2023-01-02 DIAGNOSIS — R109 Unspecified abdominal pain: Secondary | ICD-10-CM | POA: Insufficient documentation

## 2023-01-02 DIAGNOSIS — Z992 Dependence on renal dialysis: Secondary | ICD-10-CM | POA: Insufficient documentation

## 2023-01-02 DIAGNOSIS — Z5321 Procedure and treatment not carried out due to patient leaving prior to being seen by health care provider: Secondary | ICD-10-CM | POA: Insufficient documentation

## 2023-01-02 DIAGNOSIS — R140 Abdominal distension (gaseous): Secondary | ICD-10-CM | POA: Diagnosis not present

## 2023-01-02 DIAGNOSIS — I10 Essential (primary) hypertension: Secondary | ICD-10-CM | POA: Diagnosis not present

## 2023-01-02 DIAGNOSIS — R609 Edema, unspecified: Secondary | ICD-10-CM | POA: Diagnosis not present

## 2023-01-02 DIAGNOSIS — R509 Fever, unspecified: Secondary | ICD-10-CM | POA: Diagnosis not present

## 2023-01-02 DIAGNOSIS — R1084 Generalized abdominal pain: Secondary | ICD-10-CM | POA: Diagnosis not present

## 2023-01-02 LAB — LIPASE, BLOOD: Lipase: 29 U/L (ref 11–51)

## 2023-01-02 LAB — COMPREHENSIVE METABOLIC PANEL
ALT: 19 U/L (ref 0–44)
AST: 35 U/L (ref 15–41)
Albumin: 3.2 g/dL — ABNORMAL LOW (ref 3.5–5.0)
Alkaline Phosphatase: 199 U/L — ABNORMAL HIGH (ref 38–126)
Anion gap: 18 — ABNORMAL HIGH (ref 5–15)
BUN: 80 mg/dL — ABNORMAL HIGH (ref 6–20)
CO2: 17 mmol/L — ABNORMAL LOW (ref 22–32)
Calcium: 7.7 mg/dL — ABNORMAL LOW (ref 8.9–10.3)
Chloride: 102 mmol/L (ref 98–111)
Creatinine, Ser: 11.47 mg/dL — ABNORMAL HIGH (ref 0.61–1.24)
GFR, Estimated: 5 mL/min — ABNORMAL LOW (ref >60)
Glucose, Bld: 138 mg/dL — ABNORMAL HIGH (ref 70–99)
Potassium: 4.6 mmol/L (ref 3.5–5.1)
Sodium: 137 mmol/L (ref 135–145)
Total Bilirubin: 0.3 mg/dL (ref 0.3–1.2)
Total Protein: 7.1 g/dL (ref 6.5–8.1)

## 2023-01-02 LAB — CBC WITH DIFFERENTIAL/PLATELET
Abs Immature Granulocytes: 0.01 10*3/uL (ref 0.00–0.07)
Basophils Absolute: 0 10*3/uL (ref 0.0–0.1)
Basophils Relative: 1 %
Eosinophils Absolute: 0.3 10*3/uL (ref 0.0–0.5)
Eosinophils Relative: 5 %
HCT: 41.5 % (ref 39.0–52.0)
Hemoglobin: 12 g/dL — ABNORMAL LOW (ref 13.0–17.0)
Immature Granulocytes: 0 %
Lymphocytes Relative: 19 %
Lymphs Abs: 1.1 10*3/uL (ref 0.7–4.0)
MCH: 24.9 pg — ABNORMAL LOW (ref 26.0–34.0)
MCHC: 28.9 g/dL — ABNORMAL LOW (ref 30.0–36.0)
MCV: 86.3 fL (ref 80.0–100.0)
Monocytes Absolute: 0.7 10*3/uL (ref 0.1–1.0)
Monocytes Relative: 13 %
Neutro Abs: 3.5 10*3/uL (ref 1.7–7.7)
Neutrophils Relative %: 62 %
Platelets: 294 10*3/uL (ref 150–400)
RBC: 4.81 MIL/uL (ref 4.22–5.81)
RDW: 17.7 % — ABNORMAL HIGH (ref 11.5–15.5)
WBC: 5.7 10*3/uL (ref 4.0–10.5)
nRBC: 0 % (ref 0.0–0.2)

## 2023-01-02 NOTE — ED Notes (Signed)
Pt not answering for vital recheck  

## 2023-01-02 NOTE — ED Triage Notes (Addendum)
Patient BIB [facility] c/o abdominal distention. Patient reports missing dialysis on Tuesday.  Patient reports he has court today at 0830. Patient unwilling to answer any questions about how long his stomach has been distended.  Patient just puts his hand in his head and does not answer any of this nurses questions. Patient does however state "y'all should know what's going on with me, I told y'all the other day."

## 2023-01-02 NOTE — ED Notes (Signed)
Awaiting patient from lobby 

## 2023-01-02 NOTE — ED Notes (Signed)
Pt not answering to be roomed .

## 2023-01-04 ENCOUNTER — Emergency Department (HOSPITAL_COMMUNITY)
Admission: EM | Admit: 2023-01-04 | Discharge: 2023-01-05 | Disposition: A | Discharge status: Home / Self Care | Payer: 59 | Attending: Emergency Medicine | Admitting: Emergency Medicine

## 2023-01-04 ENCOUNTER — Encounter (HOSPITAL_COMMUNITY): Payer: Self-pay

## 2023-01-04 ENCOUNTER — Other Ambulatory Visit: Payer: Self-pay

## 2023-01-04 ENCOUNTER — Emergency Department (HOSPITAL_COMMUNITY): Payer: 59

## 2023-01-04 DIAGNOSIS — N186 End stage renal disease: Secondary | ICD-10-CM | POA: Diagnosis not present

## 2023-01-04 DIAGNOSIS — Z91158 Patient's noncompliance with renal dialysis for other reason: Secondary | ICD-10-CM | POA: Insufficient documentation

## 2023-01-04 DIAGNOSIS — R0989 Other specified symptoms and signs involving the circulatory and respiratory systems: Secondary | ICD-10-CM | POA: Diagnosis not present

## 2023-01-04 DIAGNOSIS — Z794 Long term (current) use of insulin: Secondary | ICD-10-CM | POA: Diagnosis not present

## 2023-01-04 DIAGNOSIS — I517 Cardiomegaly: Secondary | ICD-10-CM | POA: Diagnosis not present

## 2023-01-04 DIAGNOSIS — I120 Hypertensive chronic kidney disease with stage 5 chronic kidney disease or end stage renal disease: Secondary | ICD-10-CM | POA: Insufficient documentation

## 2023-01-04 DIAGNOSIS — R0602 Shortness of breath: Secondary | ICD-10-CM | POA: Diagnosis not present

## 2023-01-04 DIAGNOSIS — R0902 Hypoxemia: Secondary | ICD-10-CM | POA: Diagnosis not present

## 2023-01-04 DIAGNOSIS — I132 Hypertensive heart and chronic kidney disease with heart failure and with stage 5 chronic kidney disease, or end stage renal disease: Secondary | ICD-10-CM | POA: Insufficient documentation

## 2023-01-04 DIAGNOSIS — I10 Essential (primary) hypertension: Secondary | ICD-10-CM | POA: Diagnosis not present

## 2023-01-04 DIAGNOSIS — Z5321 Procedure and treatment not carried out due to patient leaving prior to being seen by health care provider: Secondary | ICD-10-CM | POA: Insufficient documentation

## 2023-01-04 DIAGNOSIS — E1122 Type 2 diabetes mellitus with diabetic chronic kidney disease: Secondary | ICD-10-CM | POA: Diagnosis not present

## 2023-01-04 DIAGNOSIS — E8770 Fluid overload, unspecified: Secondary | ICD-10-CM | POA: Insufficient documentation

## 2023-01-04 DIAGNOSIS — I5020 Unspecified systolic (congestive) heart failure: Secondary | ICD-10-CM | POA: Diagnosis not present

## 2023-01-04 DIAGNOSIS — S2241XA Multiple fractures of ribs, right side, initial encounter for closed fracture: Secondary | ICD-10-CM | POA: Diagnosis not present

## 2023-01-04 DIAGNOSIS — R2243 Localized swelling, mass and lump, lower limb, bilateral: Secondary | ICD-10-CM | POA: Diagnosis not present

## 2023-01-04 DIAGNOSIS — Z992 Dependence on renal dialysis: Secondary | ICD-10-CM | POA: Insufficient documentation

## 2023-01-04 DIAGNOSIS — R197 Diarrhea, unspecified: Secondary | ICD-10-CM | POA: Diagnosis not present

## 2023-01-04 DIAGNOSIS — R109 Unspecified abdominal pain: Secondary | ICD-10-CM | POA: Insufficient documentation

## 2023-01-04 DIAGNOSIS — Z7982 Long term (current) use of aspirin: Secondary | ICD-10-CM | POA: Insufficient documentation

## 2023-01-04 DIAGNOSIS — R609 Edema, unspecified: Secondary | ICD-10-CM | POA: Diagnosis not present

## 2023-01-04 DIAGNOSIS — Z79899 Other long term (current) drug therapy: Secondary | ICD-10-CM | POA: Insufficient documentation

## 2023-01-04 LAB — I-STAT VENOUS BLOOD GAS, ED
Acid-base deficit: 8 mmol/L — ABNORMAL HIGH (ref 0.0–2.0)
Bicarbonate: 18.2 mmol/L — ABNORMAL LOW (ref 20.0–28.0)
Calcium, Ion: 0.92 mmol/L — ABNORMAL LOW (ref 1.15–1.40)
HCT: 44 % (ref 39.0–52.0)
Hemoglobin: 15 g/dL (ref 13.0–17.0)
O2 Saturation: 95 %
Potassium: 5 mmol/L (ref 3.5–5.1)
Sodium: 138 mmol/L (ref 135–145)
TCO2: 19 mmol/L — ABNORMAL LOW (ref 22–32)
pCO2, Ven: 40.3 mmHg — ABNORMAL LOW (ref 44–60)
pH, Ven: 7.264 (ref 7.25–7.43)
pO2, Ven: 87 mmHg — ABNORMAL HIGH (ref 32–45)

## 2023-01-04 LAB — HEPATIC FUNCTION PANEL
ALT: 23 U/L (ref 0–44)
AST: 38 U/L (ref 15–41)
Albumin: 3.4 g/dL — ABNORMAL LOW (ref 3.5–5.0)
Alkaline Phosphatase: 228 U/L — ABNORMAL HIGH (ref 38–126)
Bilirubin, Direct: 0.4 mg/dL — ABNORMAL HIGH (ref 0.0–0.2)
Indirect Bilirubin: 0.1 mg/dL — ABNORMAL LOW (ref 0.3–0.9)
Total Bilirubin: 0.5 mg/dL (ref 0.3–1.2)
Total Protein: 7.7 g/dL (ref 6.5–8.1)

## 2023-01-04 LAB — BASIC METABOLIC PANEL
Anion gap: 20 — ABNORMAL HIGH (ref 5–15)
BUN: 103 mg/dL — ABNORMAL HIGH (ref 6–20)
CO2: 16 mmol/L — ABNORMAL LOW (ref 22–32)
Calcium: 7.3 mg/dL — ABNORMAL LOW (ref 8.9–10.3)
Chloride: 102 mmol/L (ref 98–111)
Creatinine, Ser: 13.41 mg/dL — ABNORMAL HIGH (ref 0.61–1.24)
GFR, Estimated: 4 mL/min — ABNORMAL LOW (ref >60)
Glucose, Bld: 150 mg/dL — ABNORMAL HIGH (ref 70–99)
Potassium: 5.2 mmol/L — ABNORMAL HIGH (ref 3.5–5.1)
Sodium: 138 mmol/L (ref 135–145)

## 2023-01-04 LAB — I-STAT CHEM 8, ED
BUN: 110 mg/dL — ABNORMAL HIGH (ref 6–20)
Calcium, Ion: 0.92 mmol/L — ABNORMAL LOW (ref 1.15–1.40)
Chloride: 107 mmol/L (ref 98–111)
Creatinine, Ser: 15 mg/dL — ABNORMAL HIGH (ref 0.61–1.24)
Glucose, Bld: 147 mg/dL — ABNORMAL HIGH (ref 70–99)
HCT: 45 % (ref 39.0–52.0)
Hemoglobin: 15.3 g/dL (ref 13.0–17.0)
Potassium: 4.9 mmol/L (ref 3.5–5.1)
Sodium: 138 mmol/L (ref 135–145)
TCO2: 19 mmol/L — ABNORMAL LOW (ref 22–32)

## 2023-01-04 LAB — ETHANOL: Alcohol, Ethyl (B): <10 mg/dL (ref <10)

## 2023-01-04 LAB — BRAIN NATRIURETIC PEPTIDE: B Natriuretic Peptide: >4500 pg/mL — ABNORMAL HIGH (ref 0.0–100.0)

## 2023-01-04 MED ORDER — CHLORHEXIDINE GLUCONATE CLOTH 2 % EX PADS: 6.0000 | MEDICATED_PAD | Freq: Every day | CUTANEOUS | Status: DC

## 2023-01-04 NOTE — ED Provider Notes (Signed)
[place] EMERGENCY DEPARTMENT AT [facility] Provider Note   CSN: [id number] Arrival date & time: 01/04/23  1610     History  Chief Complaint  Patient presents with   Shortness of Breath    [name] is a 52 y.o. male.  52 yo M with a chief complaint of difficulty breathing.  He has not been to dialysis in a week.  He wont really tell me why.  Tells me he is here to get dialysis completed.   Shortness of Breath      Home Medications Prior to Admission medications   Medication Sig Start Date End Date Taking? Authorizing Provider  acetaminophen (TYLENOL) 500 MG tablet Take 1 tablet (500 mg total) by mouth every 8 (eight) hours as needed for mild pain. 05/28/22   Leroy Sea, MD  ASPIRIN LOW DOSE 81 MG tablet Take 1 tablet (81 mg total) by mouth daily. 05/28/22   Leroy Sea, MD  benzonatate (TESSALON) 100 MG capsule Take 1 capsule (100 mg total) by mouth every 8 (eight) hours. 09/09/22   Sabas Sous, MD  calcitRIOL (ROCALTROL) 0.25 MCG capsule Take 1 capsule (0.25 mcg total) by mouth every Tuesday, Thursday, and Saturday at 6 PM. 10/26/22   Ghimire, Werner Lean, MD  calcium acetate (PHOSLO) 667 MG capsule Take 3 capsules (2,001 mg total) by mouth 3 (three) times daily. 05/28/22   Leroy Sea, MD  calcium acetate (PHOSLO) 667 MG tablet Take 3 tablets (2,001 mg total) by mouth 3 (three) times daily. 12/12/22     carvedilol (COREG) 25 MG tablet Take 1 tablet (25 mg total) by mouth 2 (two) times daily with a meal. 10/26/22   Ghimire, Werner Lean, MD  carvedilol (COREG) 25 MG tablet Take 1 tablet by mouth twice a day 12/12/22     glucose blood test strip Use to check fasting blood sugar once daily. 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 10/26/22   [provider]  insulin aspart (NOVOLOG FLEXPEN) 100 UNIT/ML FlexPen 0-9 Units, Subcutaneous, 3 times daily with meals CBG  < 70: Implement Hypoglycemia measures CBG 70 - 120: 0 units CBG 121 - 150: 1 unit CBG 151 - 200: 2 units CBG 201 - 250: 3 units CBG 251 - 300: 5 units CBG 301 - 350: 7 units CBG 351 - 400: 9 units CBG > 400: call MD 10/26/22   Maretta Bees, MD  insulin glargine-yfgn (SEMGLEE) 100 UNIT/ML injection Inject 0.07 mLs (7 Units total) into the skin at bedtime. 10/26/22   Ghimire, Werner Lean, MD  Insulin Pen Needle 32G X 4 MM MISC Use with insulin pens 05/28/22   Leroy Sea, MD  isosorbide mononitrate (IMDUR) 60 MG 24 hr tablet Take 1 tablet (60 mg total) by mouth daily. 10/26/22   Ghimire, Werner Lean, MD  isosorbide mononitrate (IMDUR) 60 MG 24 hr tablet Take 1 tablet (60 mg total) by mouth Nightly. 12/12/22     melatonin 3 MG TABS tablet Take 1 tablet (3 mg total) by mouth at bedtime as needed (insomnia). 10/26/22   Ghimire, Werner Lean, MD  oxyCODONE-acetaminophen (PERCOCET/ROXICET) 5-325 MG tablet Take 1 tablet by mouth every 12 (twelve) hours as needed for severe pain. 10/26/22   Ghimire, Werner Lean, MD  PROTONIX 40 MG tablet Take 1 tablet (40 mg total) by mouth daily. 05/25/22   Leroy Sea, MD  simethicone (MYLICON) 80 MG chewable tablet Chew 1 tablet (80 mg total) by mouth 4 (four) times daily for 5 days. 11/22/22 12/17/22  Rodolph Bong, MD  gabapentin (NEURONTIN) 300 MG capsule Take 1 capsule (300 mg total) by mouth 3 (three) times daily. Patient not taking: Reported on 07/27/2019 07/29/18 08/30/19  Kallie Locks, FNP  sildenafil (VIAGRA) 25 MG tablet Take 1 tablet (25 mg total) by mouth as needed for erectile dysfunction. for erectile dysfunction. Patient not taking: Reported on 08/11/2020 10/13/19 11/27/20  Kallie Locks, FNP      Allergies    Sulfa antibiotics, Other, Bactrim [sulfamethoxazole-trimethoprim], and Cephalexin    Review of Systems   Review of Systems  Respiratory:  Positive for shortness of breath.     Physical Exam Updated Vital Signs BP (!) 159/86 (BP Location: Left  Arm)   Pulse 81   Temp 97.8 F (36.6 C) (Oral)   Resp 16   Ht 5\' 7"  (1.702 m)   Wt 73.9 kg   SpO2 98%   BMI 25.53 kg/m  Physical Exam Vitals and nursing note reviewed.  Constitutional:      Appearance: He is well-developed.  HENT:     Head: Normocephalic and atraumatic.     Comments: Some edema to the face Eyes:     Pupils: Pupils are equal, round, and reactive to light.  Neck:     Vascular: No JVD.  Cardiovascular:     Rate and Rhythm: Normal rate and regular rhythm.     Heart sounds: No murmur heard.    No friction rub. No gallop.  Pulmonary:     Effort: No respiratory distress.     Breath sounds: No wheezing.     Comments: No obvious difficulty breathing on exam. Abdominal:     General: There is no distension.     Tenderness: There is no abdominal tenderness. There is no guarding or rebound.  Musculoskeletal:        General: Normal range of motion.     Cervical back: Normal range of motion and neck supple.     Right lower leg: Edema present.     Left lower leg: Edema present.     Comments: 4+ edema to the legs up to the thighs.  Skin:    Coloration: Skin is not pale.     Findings: No rash.  Neurological:     Mental Status: He is alert and oriented to person, place, and time.  Psychiatric:        Behavior: Behavior normal.     ED Results / Procedures / Treatments   Labs (all labs ordered are listed, but only abnormal results are displayed) Labs Reviewed  BASIC METABOLIC PANEL - Abnormal; Notable for the following components:      Result Value   Potassium 5.2 (*)    CO2 16 (*)    Glucose, Bld 150 (*)    BUN 103 (*)    Creatinine, Ser 13.41 (*)    Calcium 7.3 (*)    GFR, Estimated 4 (*)    Anion gap 20 (*)    All other components within normal limits  HEPATIC FUNCTION PANEL - Abnormal; Notable for the following components:   Albumin 3.4 (*)    Alkaline Phosphatase 228 (*)    Bilirubin, Direct 0.4 (*)    Indirect Bilirubin 0.1 (*)    All other  components within normal limits  BRAIN NATRIURETIC PEPTIDE - Abnormal; Notable for the following components:  B Natriuretic Peptide >4,500.0 (*)    All other components within normal limits  I-STAT CHEM 8, ED - Abnormal; Notable for the following components:   BUN 110 (*)    Creatinine, Ser 15.00 (*)    Glucose, Bld 147 (*)    Calcium, Ion 0.92 (*)    TCO2 19 (*)    All other components within normal limits  I-STAT VENOUS BLOOD GAS, ED - Abnormal; Notable for the following components:   pCO2, Ven 40.3 (*)    pO2, Ven 87 (*)    Bicarbonate 18.2 (*)    TCO2 19 (*)    Acid-base deficit 8.0 (*)    Calcium, Ion 0.92 (*)    All other components within normal limits  ETHANOL  RAPID URINE DRUG SCREEN, HOSP PERFORMED  URINALYSIS, ROUTINE W REFLEX MICROSCOPIC    EKG None  Radiology DG Chest 2 View  Result Date: 01/04/2023 CLINICAL DATA:  Shortness of breath. EXAM: CHEST - 2 VIEW COMPARISON:  December 20, 2022 FINDINGS: The cardiac silhouette is markedly enlarged and unchanged in size. There is mild prominence of the perihilar pulmonary vasculature. Mild linear atelectasis is seen within the mid left lung. There is no evidence of acute infiltrate, pleural effusion or pneumothorax. Multiple chronic right-sided rib fractures are seen. IMPRESSION: Stable cardiomegaly with mild pulmonary vascular congestion. Electronically Signed   By: Aram Candela M.D.   On: 01/04/2023 17:18    Procedures Procedures    Medications Ordered in ED Medications  Chlorhexidine Gluconate Cloth 2 % PADS 6 each (has no administration in time range)    ED Course/ Medical Decision Making/ A&P                                 Medical Decision Making  52 yo M with a chief complaints of wanting dialysis completed.  Patient says he has not had dialysis in about a week.  His BUN is greater than 100.  He is a bit sleepy for me on exam.  VBG without hypercarbia.  Potassium levels normal.  Discussed with Dr.  Arlean Hopping, will try and dialyze then back to the ED for repeat eval.  \   The patients results and plan were reviewed and discussed.   Any x-rays performed were independently reviewed by myself.   Differential diagnosis were considered with the presenting HPI.  Medications  Chlorhexidine Gluconate Cloth 2 % PADS 6 each (has no administration in time range)    Vitals:   01/04/23 1619 01/04/23 1624 01/04/23 1810  BP:  (!) 159/86   Pulse:  81   Resp:  16   Temp:  98.8 F (37.1 C) 97.8 F (36.6 C)  TempSrc:  Oral Oral  SpO2:  98%   Weight: 73.9 kg    Height: 5\' 7"  (1.702 m)      Final diagnoses:  Hypervolemia, unspecified hypervolemia type    Admission/ observation were discussed with the admitting physician, patient and/or family and they are comfortable with the plan.         Final Clinical Impression(s) / ED Diagnoses Final diagnoses:  Hypervolemia, unspecified hypervolemia type    Rx / DC Orders ED Discharge Orders     None         Melene Plan, DO 01/04/23 2230

## 2023-01-04 NOTE — ED Triage Notes (Signed)
Pt BIB EMS with c/o SOB due to missing 3 of his HD sessions. Per pt, he has not had HD since last Saturday. Pt did not go to HD session today due to feeling sick. Pt denies CP.

## 2023-01-04 NOTE — Progress Notes (Signed)
Asked to see for ED HD.  Pt here w/ c/o SOB. He is chronic vol overload. Last HD was 1 week ago. Per ED MD pt is not in distress but clearly vol overload on exam. CXR is negative for edema. Pt seen in ED hallway bed. No distress, calm, mild basilar rales on chest exam and 2+ diffuse LE edema. The plan will be for "ED HD". Patient is not being admitted. Pt will go to the dialysis unit upstairs when they are ready for the pt. Then the pt will get dialysis. When dialysis is completed pt will be sent back to ED for reassessment.     OP HD: TTS  [facility]  4h   400/800    61.5kg    2/2.5 bath  AVG   Heparin 2000 - last OP HD 8/10, post wt 65.9kg (up 4.8kg) - rocaltrol 0.25 mcg po three times per week - venofer 100mg  IV q hd thru 8/20

## 2023-01-05 ENCOUNTER — Emergency Department (HOSPITAL_COMMUNITY)
Admission: EM | Admit: 2023-01-05 | Discharge: 2023-01-06 | Discharge status: Against Medical Advice | Payer: 59 | Source: Home / Self Care

## 2023-01-05 ENCOUNTER — Other Ambulatory Visit: Payer: Self-pay

## 2023-01-05 ENCOUNTER — Encounter (HOSPITAL_COMMUNITY): Payer: Self-pay | Admitting: Emergency Medicine

## 2023-01-05 DIAGNOSIS — R109 Unspecified abdominal pain: Secondary | ICD-10-CM | POA: Insufficient documentation

## 2023-01-05 DIAGNOSIS — Z5321 Procedure and treatment not carried out due to patient leaving prior to being seen by health care provider: Secondary | ICD-10-CM | POA: Insufficient documentation

## 2023-01-05 LAB — COMPREHENSIVE METABOLIC PANEL
ALT: 17 U/L (ref 0–44)
AST: 26 U/L (ref 15–41)
Albumin: 3.2 g/dL — ABNORMAL LOW (ref 3.5–5.0)
Alkaline Phosphatase: 199 U/L — ABNORMAL HIGH (ref 38–126)
Anion gap: 18 — ABNORMAL HIGH (ref 5–15)
BUN: 73 mg/dL — ABNORMAL HIGH (ref 6–20)
CO2: 19 mmol/L — ABNORMAL LOW (ref 22–32)
Calcium: 7.6 mg/dL — ABNORMAL LOW (ref 8.9–10.3)
Chloride: 100 mmol/L (ref 98–111)
Creatinine, Ser: 11 mg/dL — ABNORMAL HIGH (ref 0.61–1.24)
GFR, Estimated: 5 mL/min — ABNORMAL LOW (ref >60)
Glucose, Bld: 140 mg/dL — ABNORMAL HIGH (ref 70–99)
Potassium: 4 mmol/L (ref 3.5–5.1)
Sodium: 137 mmol/L (ref 135–145)
Total Bilirubin: 0.6 mg/dL (ref 0.3–1.2)
Total Protein: 7.5 g/dL (ref 6.5–8.1)

## 2023-01-05 LAB — CBC
HCT: 42.1 % (ref 39.0–52.0)
Hemoglobin: 12.6 g/dL — ABNORMAL LOW (ref 13.0–17.0)
MCH: 24.5 pg — ABNORMAL LOW (ref 26.0–34.0)
MCHC: 29.9 g/dL — ABNORMAL LOW (ref 30.0–36.0)
MCV: 81.7 fL (ref 80.0–100.0)
Platelets: 295 10*3/uL (ref 150–400)
RBC: 5.15 MIL/uL (ref 4.22–5.81)
RDW: 17.6 % — ABNORMAL HIGH (ref 11.5–15.5)
WBC: 4.6 10*3/uL (ref 4.0–10.5)
nRBC: 0.4 % — ABNORMAL HIGH (ref 0.0–0.2)

## 2023-01-05 LAB — LIPASE, BLOOD: Lipase: 28 U/L (ref 11–51)

## 2023-01-05 MED ORDER — HEPARIN SODIUM (PORCINE) 1000 UNIT/ML DIALYSIS
2000.0000 [IU] | Freq: Once | INTRAMUSCULAR | Status: AC
  Administered 2023-01-05: 2000 [IU] via INTRAVENOUS_CENTRAL

## 2023-01-05 NOTE — ED Triage Notes (Signed)
Pt c/o stomach "tightness for awhile" and a "boil" in his nose

## 2023-01-05 NOTE — ED Provider Notes (Signed)
[place] EMERGENCY DEPARTMENT AT [facility] Provider Note  MDM   HPI/ROS:  [name] is a 52 y.o. male with ESRD, diabetes, hypertension, HFrEF presenting back to the emergency department after undergoing dialysis session.  Per dialysis note, patient was scheduled to receive 3-1/2 hours but chose to end session after 2 hours and 12 minutes.  AMA was signed.  Upon returning to the emergency department, patient is denying chest pain, shortness of breath.  Denies recent fevers or infectious symptoms.  Review of systems negative aside from bilateral lower extremity swelling.  Physical exam is notable for: - Chronically ill-appearing - Bilateral breath sounds present with no accessory or diminished lung sounds - Significant bilateral lower extremity pitting edema  On my initial evaluation, patient is:  -Vital signs stable. Patient afebrile, hemodynamically stable, and non-toxic appearing. -Additional history obtained from chart review  Considering patient is asymptomatic after returning from dialysis, deemed safe and stable for discharge.  Instructed to return to the emergency department should he experience any additional symptoms and to follow-up with dialysis providers outpatient.  Disposition:  I discussed the plan for discharge with the patient and/or their surrogate at bedside prior to discharge and they were in agreement with the plan and verbalized understanding of the return precautions provided. All questions answered to the best of my ability. Ultimately, the patient was discharged in stable condition with stable vital signs. I am reassured that they are capable of close follow up and good social support at home.   Clinical Impression:  1. Hypervolemia, unspecified hypervolemia type     Rx / DC Orders ED Discharge Orders     None       The plan for this patient was discussed with Dr. Dalene Seltzer, who voiced agreement and who oversaw evaluation and treatment  of this patient.   Clinical Complexity A medically appropriate history, review of systems, and physical exam was performed.  My independent interpretations of EKG, labs, and radiology are documented in the ED course above.   Click here for ABCD2, HEART and other calculatorsREFRESH Note before signing   Patient's presentation is most consistent with acute complicated illness / injury requiring diagnostic workup.  Medical Decision Making   HPI/ROS      See MDM section for pertinent HPI and ROS. A complete ROS was performed with pertinent positives/negatives noted above.   Past Medical History:  Diagnosis Date   Anxiety 02/2019   Anxiety 10/2019   Diabetes mellitus type II, uncontrolled 07/17/2006   Elevated alkaline phosphatase level 01/2019   ESSENTIAL HYPERTENSION 07/17/2006   Homelessness 10/13/2021   HYPERLIPIDEMIA 11/24/2008   Insomnia 02/2019   Left bundle branch block 02/22/2019   Lesion of lip 10/2019   Pneumonia 10/22/2011   Renal disorder    Suicidal ideations 02/2019   Vitamin D deficiency 01/2019    Past Surgical History:  Procedure Laterality Date   ARM SURGERY     AV FISTULA PLACEMENT Right 02/20/2021   Procedure: RIGHT ARTERIOVENOUS GRAFT CREATION;  Surgeon: Chuck Hint, MD;  Location: [facility] OR;  Service: Vascular;  Laterality: Right;   INSERTION OF DIALYSIS CATHETER Left 02/20/2021   Procedure: INSERTION OF DIALYSIS CATHETER USING PALINDROME CATHETER;  Surgeon: Chuck Hint, MD;  Location: [facility] OR;  Service: Vascular;  Laterality: Left;   IR AV DIALY SHUNT INTRO NEEDLE/INTRACATH INITIAL W/PTA/IMG RIGHT Right 11/06/2021   IR FLUORO GUIDE CV LINE RIGHT  09/12/2022   IR REMOVAL TUN CV CATH W/O FL  07/27/2021  IR THROMBECTOMY AV FISTULA W/THROMBOLYSIS INC/SHUNT/IMG RIGHT  09/11/2022   IR US GUIDE VASC ACCESS RIGHT  11/06/2021   IR US GUIDE VASC ACCESS RIGHT  09/11/2022   IR US GUIDE VASC ACCESS RIGHT  09/12/2022   TEE WITHOUT CARDIOVERSION N/A  10/25/2022   Procedure: TRANSESOPHAGEAL ECHOCARDIOGRAM;  Surgeon: Chilton Si, MD;  Location: [facility] INVASIVE CV LAB;  Service: Cardiovascular;  Laterality: N/A;   ULTRASOUND GUIDANCE FOR VASCULAR ACCESS  02/20/2021   Procedure: ULTRASOUND GUIDANCE FOR VASCULAR ACCESS;  Surgeon: Chuck Hint, MD;  Location: [facility] OR;  Service: Vascular;;      Physical Exam   Vitals:   01/05/23 1330 01/05/23 1432 01/05/23 1620 01/05/23 1645  BP: (!) 176/93 (!) 153/85 (!) 157/87   Pulse: 73 74 77   Resp: 15 14 18    Temp:    98 F (36.7 C)  TempSrc:    Oral  SpO2: 100% 99% 100%   Weight:      Height:        Physical Exam Vitals and nursing note reviewed.  Constitutional:      General: He is not in acute distress.    Appearance: He is well-developed.  HENT:     Head: Normocephalic and atraumatic.  Eyes:     Conjunctiva/sclera: Conjunctivae normal.  Cardiovascular:     Rate and Rhythm: Normal rate and regular rhythm.     Heart sounds: No murmur heard. Pulmonary:     Effort: Pulmonary effort is normal. No respiratory distress.     Breath sounds: Normal breath sounds.  Abdominal:     Palpations: Abdomen is soft.     Tenderness: There is no abdominal tenderness.  Musculoskeletal:        General: No swelling.     Cervical back: Neck supple.     Right lower leg: Edema present.     Left lower leg: Edema present.  Skin:    General: Skin is warm and dry.     Capillary Refill: Capillary refill takes less than 2 seconds.  Neurological:     Mental Status: He is alert.  Psychiatric:        Mood and Affect: Mood normal.     Starleen Arms, MD Department of Emergency Medicine   Please note that this documentation was produced with the assistance of voice-to-text technology and may contain errors.    Dyanne Iha, MD 01/05/23 1710    Alvira Monday, MD 01/07/23 [id number]

## 2023-01-05 NOTE — Procedures (Signed)
HD Note:  Some information was entered later than the data was gathered due to patient care needs. The stated time with the data is accurate.  Received patient on stretcher from ED. Patient refused to open his eyes or answer any questions.  He would not roll over to allow assessment.  Patient did extend his arm to allow treatment.  Patient allowed the BP cuff to be placed.  TX duration: Patient ordered 3.5 hours of treatment.  Patient chose to stop treatment after 2 hours and 12 min.  AMA signed  Access used: right lower arm graft Access issues: None  Total UF removed: 2100 ml  Hand-off given to patient's nurse.   Laury Huizar L. Dareen Piano, RN [facility].

## 2023-01-05 NOTE — ED Notes (Signed)
RN called dialysis for update, Dialysis states pt is still in transport. Pt currently in [facility] 22

## 2023-01-05 NOTE — ED Notes (Signed)
RN called dialysis for update when pt can go, Dialysis states they put pt in for transport.

## 2023-01-05 NOTE — ED Notes (Signed)
Patient is resting comfortably. 

## 2023-01-05 NOTE — ED Notes (Signed)
RN reported off to [facility] [employer]

## 2023-01-05 NOTE — ED Notes (Signed)
RN called dialysis for update on when pt can go, Dialysis states in an hour

## 2023-01-05 NOTE — Discharge Instructions (Signed)
Continue dialysis, return for new or worsening symptoms.

## 2023-01-05 NOTE — ED Notes (Signed)
Pt reports he does not make urine, unable to obtain sample

## 2023-01-05 NOTE — Procedures (Signed)
Asked to see for ED HD.  Pt here w/ c/o SOB. He is chronic vol overload. Last HD was 1 week ago. Per ED MD pt is not in distress but clearly vol overload on exam. CXR is negative for edema. Pt seen in ED hallway bed. No distress, calm, negative chest exam and 2+ diffuse LE edema. The plan will be for "ED HD". Patient is not being admitted. Pt will go to the dialysis unit upstairs when they are ready for the pt. Then the pt will get dialysis. When dialysis is completed pt will be sent back to ED for reassessment.     OP HD: TTS  [facility]  4h   400/800    61.5kg    2/2.5 bath  AVG   Heparin 2000 - last OP HD 8/10, post wt 65.9kg (up 4.8kg) - rocaltrol 0.25 mcg po three times per week - venofer 100mg  IV q hd thru 8/20  Vinson Moselle  MD  [facility] 01/05/2023, 11:46 AM  Recent Labs  Lab 01/02/23 0451 01/04/23 1626 01/04/23 1659  HGB 12.0*  --  15.0  15.3  ALBUMIN 3.2* 3.4*  --   CALCIUM 7.7* 7.3*  --   CREATININE 11.47* 13.41* 15.00*  K 4.6 5.2* 5.0  4.9    Inpatient medications:  Chlorhexidine Gluconate Cloth  6 each Topical Q0600

## 2023-01-06 ENCOUNTER — Ambulatory Visit: Payer: 59 | Admitting: Podiatry

## 2023-01-06 NOTE — ED Notes (Signed)
Pt outside on bench and refusing vitals.

## 2023-01-06 NOTE — ED Notes (Signed)
Pt refusing to come to bed, stating the hospital took too long. Security notified

## 2023-01-07 ENCOUNTER — Ambulatory Visit (HOSPITAL_COMMUNITY)
Admission: EM | Admit: 2023-01-07 | Discharge: 2023-01-07 | Disposition: A | Discharge status: Home / Self Care | Payer: 59 | Attending: Family Medicine | Admitting: Family Medicine

## 2023-01-07 ENCOUNTER — Encounter (HOSPITAL_COMMUNITY): Payer: Self-pay | Admitting: Emergency Medicine

## 2023-01-07 ENCOUNTER — Other Ambulatory Visit: Payer: Self-pay

## 2023-01-07 DIAGNOSIS — I10 Essential (primary) hypertension: Secondary | ICD-10-CM | POA: Diagnosis not present

## 2023-01-07 DIAGNOSIS — J340 Abscess, furuncle and carbuncle of nose: Secondary | ICD-10-CM

## 2023-01-07 MED ORDER — MUPIROCIN 2 % EX OINT
1.0000 | TOPICAL_OINTMENT | Freq: Three times a day (TID) | CUTANEOUS | 0 refills | Status: DC
  Filled 2023-01-07: qty 22, 8d supply, fill #0

## 2023-01-07 MED ORDER — DOXYCYCLINE HYCLATE 100 MG PO CAPS
100.0000 mg | ORAL_CAPSULE | Freq: Two times a day (BID) | ORAL | 0 refills | Status: AC
Start: 2023-01-07 — End: ?
  Filled 2023-01-07: qty 14, 7d supply, fill #0

## 2023-01-07 NOTE — ED Triage Notes (Signed)
Pt has boil in nose for a couple days that is painful.  Pt didn't go to dialysis today as over slept. Reports going tomorrow.

## 2023-01-07 NOTE — Discharge Instructions (Signed)
Your blood pressure was noted to be elevated during your visit today. If you are currently taking medication for high blood pressure, please ensure you are taking this as directed. If you do not have a history of high blood pressure and your blood pressure remains persistently elevated, you may need to begin taking a medication at some point. You may return here within the next few days to recheck if unable to see your primary care provider or if you do not have a one.  BP (!) 169/89 (BP Location: Left Arm)   Pulse 77   Temp 97.6 F (36.4 C) (Oral)   Resp 14   SpO2 95%   BP Readings from Last 3 Encounters:  01/07/23 (!) 169/89  01/05/23 (!) 174/98  01/05/23 (!) 157/87

## 2023-01-07 NOTE — ED Provider Notes (Signed)
[facility]   [id number] 01/07/23 Arrival Time: 0943  ASSESSMENT & PLAN:  1. Nasal abscess   2. Elevated blood pressure reading in office with diagnosis of hypertension    Area is draining purulent debris. Too small now to necessitate I&D.  Begin: Meds ordered this encounter  Medications   mupirocin ointment (BACTROBAN) 2 %    Sig: Apply 1 Application topically 3 (three) times daily.    Dispense:  22 g    Refill:  0   doxycycline (VIBRAMYCIN) 100 MG capsule    Sig: Take 1 capsule (100 mg total) by mouth 2 (two) times daily.    Dispense:  14 capsule    Refill:  0   Finish all antibiotics. OTC analgesics as needed. May return here as needed/not improving. ED if rapid worsening.  Reviewed expectations re: course of current medical issues. Questions answered. Outlined signs and symptoms indicating need for more acute intervention. Patient verbalized understanding. After Visit Summary given.   SUBJECTIVE:  [name] is a 52 y.o. male who presents with a possible infection of his R naris; few days; very poor historian. Is draining; denies fever.  OBJECTIVE:  Vitals:   01/07/23 1032  BP: (!) 169/89  Pulse: 77  Resp: 14  Temp: 97.6 F (36.4 C)  TempSrc: Oral  SpO2: 95%    General appearance: alert; no distress Nose: Less than 0.5 cm slight induration of inner R naris with dried purulent debris around area which was removed with forceps; firm and thickened skin revealed without any bleeding; area is TTP Psychological: alert and cooperative; normal mood and affect  Allergies  Allergen Reactions   Sulfa Antibiotics Rash    Severe rash.  Severe rash.     Severe rash.   Other Other (See Comments)    Patient states he is allergic to the TB test - unknown reaction during childhood    Bactrim [Sulfamethoxazole-Trimethoprim] Rash   Cephalexin Other (See Comments) and Rash    Patient with severe drug reaction including exfoliating skin rash and  hypotension after being prescribed both cephalexin and sulfamethoxazole-trimethoprim simultaneously. Favor SMX as much more likely culprit as patient had tolerated other cephalosporins in the past, but cannot say with complete certainty that this was not related to cephalexin.  Other Reaction(s): Other, Other (See Comments)  Patient with severe drug reaction including exfoliating skin rash and hypotension after being prescribed both cephalexin and sulfamethoxazole-trimethoprim simultaneously. Favor SMX as much more likely culprit as patient had tolerated other cephalosporins in the past, but cannot say with complete certainty that this was not related to cephalexin.   "Patient with severe drug reaction including exfoliating skin rash and hypotension after being prescribed both cephalexin and sulfamethoxazole-trimethoprim simultaneously. Favor SMX as much more likely culprit as patient had tolerated other cephalosporins in the past, but cannot say with complete certainty that this was not related to cephalexin." As noted in [facility]   "Patient with severe drug reaction including exfoliating skin rash and hypotension after being prescribed both cephalexin and sulfamethoxazole-trimethoprim simultaneously. Favor SMX as much more likely culprit as patient had tolerated other cephalosporins in the past, but cannot say with complete certainty that this was not related to cephalexin." As noted in [facility]   "Patient with severe drug reaction including exfoliating skin rash and hypotension after being prescribed both cephalexin and sulfamethoxazole-trimethoprim simultaneously. Favor SMX as much more likely culprit as patient had tolerated other cephalosporins in the past, but cannot say with complete certainty  that this was not related to cephalexin." As noted in [facility]     Patient with severe drug reaction including exfoliating skin rash and hypotension after being prescribed both cephalexin and  sulfamethoxazole-trimethoprim simultaneously. Favor SMX as much more likely culprit as patient had tolerated other cephalosporins in the past, but cannot say with complete certainty that this was not related to cephalexin.  "Patient with severe drug reaction including exfoliating skin rash and hypotension after being prescribed both cephalexin and sulfamethoxazole-trimethoprim simultaneously. Favor SMX as much more likely culprit as patient had tolerated other cephalosporins in the past, but cannot say with complete certainty that this was not related to cephalexin." As noted in [facility]  "Patient with severe drug reaction including exfoliating skin rash and hypotension after being prescribed both cephalexin and sulfamethoxazole-trimethoprim simultaneously. Favor SMX as much more likely culprit as patient had tolerated other cephalosporins in the past, but cannot say with complete certainty that this was not related to cephalexin." As noted in [facility]     Patient with severe drug reaction inc... (TRUNCATED)    Past Medical History:  Diagnosis Date   Anxiety 02/2019   Anxiety 10/2019   Diabetes mellitus type II, uncontrolled 07/17/2006   Elevated alkaline phosphatase level 01/2019   ESSENTIAL HYPERTENSION 07/17/2006   Homelessness 10/13/2021   HYPERLIPIDEMIA 11/24/2008   Insomnia 02/2019   Left bundle branch block 02/22/2019   Lesion of lip 10/2019   Pneumonia 10/22/2011   Renal disorder    Suicidal ideations 02/2019   Vitamin D deficiency 01/2019   Social History   Socioeconomic History   Marital status: Widowed    Spouse name: Not on file   Number of children: Not on file   Years of education: Not on file   Highest education level: Not on file  Occupational History   Occupation: cook    Comment: lost job   Occupation: Company secretary    Comment: Updated June 2013   Occupation: Data processing manager    Comment: Began Jan 2014  Tobacco Use   Smoking status: Never   Smokeless  tobacco: Never  Vaping Use   Vaping status: Never Used  Substance and Sexual Activity   Alcohol use: No    Alcohol/week: 0.0 standard drinks of alcohol   Drug use: No   Sexual activity: Yes    Partners: Female  Other Topics Concern   Not on file  Social History Narrative   Lives with his mother and brother.   Wife has had a foot amputation; is a type 1 DM.   Social Determinants of Health   Financial Resource Strain: High Risk (09/24/2022)   Received from [facility] and Its Subsidiaries and Affiliates, [facility] and Its Subsidiaries and Affiliates   Overall Financial Resource Strain (CARDIA)    Difficulty of Paying Living Expenses: Very hard  Food Insecurity: Food Insecurity Present (09/24/2022)   Received from [facility] and Its Subsidiaries and Affiliates, [facility] and Its Subsidiaries and [employer] Vital Sign    Worried About Programme researcher, broadcasting/film/video in the Last Year: Sometimes true    [employer] of Food in the Last Year: Sometimes true  Transportation Needs: Unmet Transportation Needs (10/21/2022)   PRAPARE - Administrator, Civil Service (Medical): Yes    Lack of Transportation (Non-Medical): Yes  Physical Activity: Not on file  Stress: Stress Concern Present (09/24/2022)   Received from [facility] and Its Subsidiaries  and Affiliates, [facility] and Its Subsidiaries and Affiliates   [employer] of Occupational Health - Occupational Stress Questionnaire    Feeling of Stress : Very much  Social Connections: Not on file   Family History  Problem Relation Age of Onset   Hypertension Mother 7   Cancer Mother        bladder cancer   Alcohol abuse Father    Hypertension Brother    Past Surgical History:  Procedure Laterality Date   ARM SURGERY     AV FISTULA PLACEMENT Right 02/20/2021   Procedure: RIGHT ARTERIOVENOUS GRAFT CREATION;  Surgeon: Chuck Hint, MD;  Location: [facility] OR;   Service: Vascular;  Laterality: Right;   INSERTION OF DIALYSIS CATHETER Left 02/20/2021   Procedure: INSERTION OF DIALYSIS CATHETER USING PALINDROME CATHETER;  Surgeon: Chuck Hint, MD;  Location: [facility] OR;  Service: Vascular;  Laterality: Left;   IR AV DIALY SHUNT INTRO NEEDLE/INTRACATH INITIAL W/PTA/IMG RIGHT Right 11/06/2021   IR FLUORO GUIDE CV LINE RIGHT  09/12/2022   IR REMOVAL TUN CV CATH W/O FL  07/27/2021   IR THROMBECTOMY AV FISTULA W/THROMBOLYSIS INC/SHUNT/IMG RIGHT  09/11/2022   IR US GUIDE VASC ACCESS RIGHT  11/06/2021   IR US GUIDE VASC ACCESS RIGHT  09/11/2022   IR US GUIDE VASC ACCESS RIGHT  09/12/2022   TEE WITHOUT CARDIOVERSION N/A 10/25/2022   Procedure: TRANSESOPHAGEAL ECHOCARDIOGRAM;  Surgeon: Chilton Si, MD;  Location: [facility] INVASIVE CV LAB;  Service: Cardiovascular;  Laterality: N/A;   ULTRASOUND GUIDANCE FOR VASCULAR ACCESS  02/20/2021   Procedure: ULTRASOUND GUIDANCE FOR VASCULAR ACCESS;  Surgeon: Chuck Hint, MD;  Location: [facility] OR;  Service: Vascular;;            Mardella Layman, MD 01/07/23 1400

## 2023-01-09 ENCOUNTER — Other Ambulatory Visit (HOSPITAL_COMMUNITY): Payer: Self-pay

## 2023-01-13 DIAGNOSIS — E1122 Type 2 diabetes mellitus with diabetic chronic kidney disease: Secondary | ICD-10-CM | POA: Diagnosis not present

## 2023-01-13 DIAGNOSIS — N186 End stage renal disease: Secondary | ICD-10-CM | POA: Diagnosis not present

## 2023-01-13 DIAGNOSIS — Z992 Dependence on renal dialysis: Secondary | ICD-10-CM | POA: Diagnosis not present

## 2023-01-13 DIAGNOSIS — R0602 Shortness of breath: Secondary | ICD-10-CM | POA: Diagnosis not present

## 2023-01-18 DIAGNOSIS — Z992 Dependence on renal dialysis: Secondary | ICD-10-CM | POA: Diagnosis not present

## 2023-01-18 DIAGNOSIS — N186 End stage renal disease: Secondary | ICD-10-CM | POA: Diagnosis not present

## 2023-01-18 DIAGNOSIS — E1122 Type 2 diabetes mellitus with diabetic chronic kidney disease: Secondary | ICD-10-CM | POA: Diagnosis not present

## 2023-01-30 ENCOUNTER — Inpatient Hospital Stay (HOSPITAL_COMMUNITY)
Admission: EM | Admit: 2023-01-30 | Discharge: 2023-02-01 | DRG: 291 | Discharge status: Against Medical Advice | Payer: 59 | Attending: Internal Medicine | Admitting: Internal Medicine

## 2023-01-30 ENCOUNTER — Emergency Department (HOSPITAL_COMMUNITY): Payer: 59

## 2023-01-30 ENCOUNTER — Encounter (HOSPITAL_COMMUNITY): Payer: Self-pay

## 2023-01-30 DIAGNOSIS — K219 Gastro-esophageal reflux disease without esophagitis: Secondary | ICD-10-CM | POA: Diagnosis present

## 2023-01-30 DIAGNOSIS — R0989 Other specified symptoms and signs involving the circulatory and respiratory systems: Secondary | ICD-10-CM | POA: Diagnosis not present

## 2023-01-30 DIAGNOSIS — Z992 Dependence on renal dialysis: Secondary | ICD-10-CM

## 2023-01-30 DIAGNOSIS — I120 Hypertensive chronic kidney disease with stage 5 chronic kidney disease or end stage renal disease: Secondary | ICD-10-CM | POA: Diagnosis not present

## 2023-01-30 DIAGNOSIS — N186 End stage renal disease: Secondary | ICD-10-CM | POA: Diagnosis not present

## 2023-01-30 DIAGNOSIS — I132 Hypertensive heart and chronic kidney disease with heart failure and with stage 5 chronic kidney disease, or end stage renal disease: Principal | ICD-10-CM | POA: Diagnosis present

## 2023-01-30 DIAGNOSIS — Z5900 Homelessness unspecified: Secondary | ICD-10-CM

## 2023-01-30 DIAGNOSIS — Z91148 Patient's other noncompliance with medication regimen for other reason: Secondary | ICD-10-CM

## 2023-01-30 DIAGNOSIS — I152 Hypertension secondary to endocrine disorders: Secondary | ICD-10-CM | POA: Diagnosis present

## 2023-01-30 DIAGNOSIS — D638 Anemia in other chronic diseases classified elsewhere: Secondary | ICD-10-CM | POA: Insufficient documentation

## 2023-01-30 DIAGNOSIS — Z794 Long term (current) use of insulin: Secondary | ICD-10-CM

## 2023-01-30 DIAGNOSIS — Z8052 Family history of malignant neoplasm of bladder: Secondary | ICD-10-CM

## 2023-01-30 DIAGNOSIS — E1122 Type 2 diabetes mellitus with diabetic chronic kidney disease: Secondary | ICD-10-CM | POA: Diagnosis present

## 2023-01-30 DIAGNOSIS — Z5982 Transportation insecurity: Secondary | ICD-10-CM

## 2023-01-30 DIAGNOSIS — F411 Generalized anxiety disorder: Secondary | ICD-10-CM | POA: Diagnosis present

## 2023-01-30 DIAGNOSIS — Z91119 Patient's noncompliance with dietary regimen due to unspecified reason: Secondary | ICD-10-CM

## 2023-01-30 DIAGNOSIS — Z91158 Patient's noncompliance with renal dialysis for other reason: Secondary | ICD-10-CM

## 2023-01-30 DIAGNOSIS — E785 Hyperlipidemia, unspecified: Secondary | ICD-10-CM | POA: Diagnosis present

## 2023-01-30 DIAGNOSIS — D509 Iron deficiency anemia, unspecified: Secondary | ICD-10-CM | POA: Diagnosis present

## 2023-01-30 DIAGNOSIS — I5022 Chronic systolic (congestive) heart failure: Secondary | ICD-10-CM | POA: Diagnosis present

## 2023-01-30 DIAGNOSIS — Z91199 Patient's noncompliance with other medical treatment and regimen due to unspecified reason: Secondary | ICD-10-CM

## 2023-01-30 DIAGNOSIS — I160 Hypertensive urgency: Secondary | ICD-10-CM | POA: Diagnosis present

## 2023-01-30 DIAGNOSIS — E872 Acidosis, unspecified: Secondary | ICD-10-CM | POA: Diagnosis present

## 2023-01-30 DIAGNOSIS — I5043 Acute on chronic combined systolic (congestive) and diastolic (congestive) heart failure: Secondary | ICD-10-CM | POA: Diagnosis present

## 2023-01-30 DIAGNOSIS — E8770 Fluid overload, unspecified: Secondary | ICD-10-CM | POA: Diagnosis not present

## 2023-01-30 DIAGNOSIS — Z5329 Procedure and treatment not carried out because of patient's decision for other reasons: Secondary | ICD-10-CM | POA: Diagnosis present

## 2023-01-30 DIAGNOSIS — I10 Essential (primary) hypertension: Secondary | ICD-10-CM | POA: Diagnosis present

## 2023-01-30 DIAGNOSIS — D631 Anemia in chronic kidney disease: Secondary | ICD-10-CM | POA: Diagnosis present

## 2023-01-30 DIAGNOSIS — Z881 Allergy status to other antibiotic agents status: Secondary | ICD-10-CM

## 2023-01-30 DIAGNOSIS — S42032A Displaced fracture of lateral end of left clavicle, initial encounter for closed fracture: Secondary | ICD-10-CM | POA: Diagnosis not present

## 2023-01-30 DIAGNOSIS — Z79899 Other long term (current) drug therapy: Secondary | ICD-10-CM

## 2023-01-30 DIAGNOSIS — Z809 Family history of malignant neoplasm, unspecified: Secondary | ICD-10-CM

## 2023-01-30 DIAGNOSIS — Z8249 Family history of ischemic heart disease and other diseases of the circulatory system: Secondary | ICD-10-CM

## 2023-01-30 DIAGNOSIS — R0602 Shortness of breath: Secondary | ICD-10-CM | POA: Diagnosis not present

## 2023-01-30 DIAGNOSIS — Z882 Allergy status to sulfonamides status: Secondary | ICD-10-CM

## 2023-01-30 DIAGNOSIS — E162 Hypoglycemia, unspecified: Secondary | ICD-10-CM | POA: Diagnosis present

## 2023-01-30 DIAGNOSIS — I517 Cardiomegaly: Secondary | ICD-10-CM | POA: Diagnosis not present

## 2023-01-30 DIAGNOSIS — Z7982 Long term (current) use of aspirin: Secondary | ICD-10-CM

## 2023-01-30 DIAGNOSIS — Z811 Family history of alcohol abuse and dependence: Secondary | ICD-10-CM

## 2023-01-30 DIAGNOSIS — E119 Type 2 diabetes mellitus without complications: Secondary | ICD-10-CM

## 2023-01-30 LAB — CBC
HCT: 38.3 % — ABNORMAL LOW (ref 39.0–52.0)
Hemoglobin: 11.8 g/dL — ABNORMAL LOW (ref 13.0–17.0)
MCH: 24.3 pg — ABNORMAL LOW (ref 26.0–34.0)
MCHC: 30.8 g/dL (ref 30.0–36.0)
MCV: 78.8 fL — ABNORMAL LOW (ref 80.0–100.0)
Platelets: 265 10*3/uL (ref 150–400)
RBC: 4.86 MIL/uL (ref 4.22–5.81)
RDW: 19.4 % — ABNORMAL HIGH (ref 11.5–15.5)
WBC: 6.6 10*3/uL (ref 4.0–10.5)
nRBC: 0 % (ref 0.0–0.2)

## 2023-01-30 LAB — BASIC METABOLIC PANEL
Anion gap: 24 — ABNORMAL HIGH (ref 5–15)
BUN: 122 mg/dL — ABNORMAL HIGH (ref 6–20)
CO2: 10 mmol/L — ABNORMAL LOW (ref 22–32)
Calcium: 6.2 mg/dL — CL (ref 8.9–10.3)
Chloride: 105 mmol/L (ref 98–111)
Creatinine, Ser: 18.04 mg/dL — ABNORMAL HIGH (ref 0.61–1.24)
GFR, Estimated: 3 mL/min — ABNORMAL LOW (ref >60)
Glucose, Bld: 110 mg/dL — ABNORMAL HIGH (ref 70–99)
Potassium: 4.6 mmol/L (ref 3.5–5.1)
Sodium: 139 mmol/L (ref 135–145)

## 2023-01-30 NOTE — ED Triage Notes (Signed)
Pt to ED for dialysis. Last session 3 weeks ago - unsure why he has missed treatments. States he feels "bad" but will not elaborate. Denies CP. Exertional SHOB.

## 2023-01-31 ENCOUNTER — Other Ambulatory Visit: Payer: Self-pay

## 2023-01-31 ENCOUNTER — Encounter (HOSPITAL_COMMUNITY): Payer: Self-pay | Admitting: Internal Medicine

## 2023-01-31 DIAGNOSIS — Z91199 Patient's noncompliance with other medical treatment and regimen due to unspecified reason: Secondary | ICD-10-CM | POA: Diagnosis not present

## 2023-01-31 DIAGNOSIS — N186 End stage renal disease: Secondary | ICD-10-CM

## 2023-01-31 DIAGNOSIS — E1122 Type 2 diabetes mellitus with diabetic chronic kidney disease: Secondary | ICD-10-CM | POA: Diagnosis not present

## 2023-01-31 DIAGNOSIS — E8779 Other fluid overload: Secondary | ICD-10-CM

## 2023-01-31 DIAGNOSIS — E872 Acidosis, unspecified: Secondary | ICD-10-CM | POA: Diagnosis not present

## 2023-01-31 DIAGNOSIS — E119 Type 2 diabetes mellitus without complications: Secondary | ICD-10-CM

## 2023-01-31 DIAGNOSIS — E8770 Fluid overload, unspecified: Secondary | ICD-10-CM

## 2023-01-31 DIAGNOSIS — D638 Anemia in other chronic diseases classified elsewhere: Secondary | ICD-10-CM | POA: Insufficient documentation

## 2023-01-31 DIAGNOSIS — Z881 Allergy status to other antibiotic agents status: Secondary | ICD-10-CM | POA: Diagnosis not present

## 2023-01-31 DIAGNOSIS — I10 Essential (primary) hypertension: Secondary | ICD-10-CM

## 2023-01-31 DIAGNOSIS — Z91119 Patient's noncompliance with dietary regimen due to unspecified reason: Secondary | ICD-10-CM | POA: Diagnosis not present

## 2023-01-31 DIAGNOSIS — I5022 Chronic systolic (congestive) heart failure: Secondary | ICD-10-CM | POA: Diagnosis not present

## 2023-01-31 DIAGNOSIS — Z91158 Patient's noncompliance with renal dialysis for other reason: Secondary | ICD-10-CM

## 2023-01-31 DIAGNOSIS — Z809 Family history of malignant neoplasm, unspecified: Secondary | ICD-10-CM | POA: Diagnosis not present

## 2023-01-31 DIAGNOSIS — E162 Hypoglycemia, unspecified: Secondary | ICD-10-CM | POA: Diagnosis not present

## 2023-01-31 DIAGNOSIS — N250 Renal osteodystrophy: Secondary | ICD-10-CM | POA: Diagnosis not present

## 2023-01-31 DIAGNOSIS — E785 Hyperlipidemia, unspecified: Secondary | ICD-10-CM | POA: Diagnosis not present

## 2023-01-31 DIAGNOSIS — Z5900 Homelessness unspecified: Secondary | ICD-10-CM | POA: Diagnosis not present

## 2023-01-31 DIAGNOSIS — D631 Anemia in chronic kidney disease: Secondary | ICD-10-CM | POA: Diagnosis not present

## 2023-01-31 DIAGNOSIS — Z992 Dependence on renal dialysis: Secondary | ICD-10-CM

## 2023-01-31 DIAGNOSIS — F411 Generalized anxiety disorder: Secondary | ICD-10-CM | POA: Diagnosis not present

## 2023-01-31 DIAGNOSIS — I160 Hypertensive urgency: Secondary | ICD-10-CM | POA: Diagnosis not present

## 2023-01-31 DIAGNOSIS — K219 Gastro-esophageal reflux disease without esophagitis: Secondary | ICD-10-CM

## 2023-01-31 DIAGNOSIS — I5023 Acute on chronic systolic (congestive) heart failure: Secondary | ICD-10-CM | POA: Diagnosis not present

## 2023-01-31 DIAGNOSIS — Z79899 Other long term (current) drug therapy: Secondary | ICD-10-CM | POA: Diagnosis not present

## 2023-01-31 DIAGNOSIS — D509 Iron deficiency anemia, unspecified: Secondary | ICD-10-CM | POA: Diagnosis not present

## 2023-01-31 DIAGNOSIS — I5043 Acute on chronic combined systolic (congestive) and diastolic (congestive) heart failure: Secondary | ICD-10-CM | POA: Diagnosis not present

## 2023-01-31 DIAGNOSIS — Z794 Long term (current) use of insulin: Secondary | ICD-10-CM | POA: Diagnosis not present

## 2023-01-31 DIAGNOSIS — Z8249 Family history of ischemic heart disease and other diseases of the circulatory system: Secondary | ICD-10-CM | POA: Diagnosis not present

## 2023-01-31 DIAGNOSIS — I132 Hypertensive heart and chronic kidney disease with heart failure and with stage 5 chronic kidney disease, or end stage renal disease: Secondary | ICD-10-CM | POA: Diagnosis not present

## 2023-01-31 DIAGNOSIS — Z5329 Procedure and treatment not carried out because of patient's decision for other reasons: Secondary | ICD-10-CM | POA: Diagnosis not present

## 2023-01-31 LAB — COMPREHENSIVE METABOLIC PANEL
ALT: 15 U/L (ref 0–44)
AST: 17 U/L (ref 15–41)
Albumin: 3.1 g/dL — ABNORMAL LOW (ref 3.5–5.0)
Alkaline Phosphatase: 170 U/L — ABNORMAL HIGH (ref 38–126)
Anion gap: 22 — ABNORMAL HIGH (ref 5–15)
BUN: 124 mg/dL — ABNORMAL HIGH (ref 6–20)
CO2: 12 mmol/L — ABNORMAL LOW (ref 22–32)
Calcium: 6.2 mg/dL — CL (ref 8.9–10.3)
Chloride: 105 mmol/L (ref 98–111)
Creatinine, Ser: 18.59 mg/dL — ABNORMAL HIGH (ref 0.61–1.24)
GFR, Estimated: 3 mL/min — ABNORMAL LOW (ref >60)
Glucose, Bld: 89 mg/dL (ref 70–99)
Potassium: 4.6 mmol/L (ref 3.5–5.1)
Sodium: 139 mmol/L (ref 135–145)
Total Bilirubin: 0.8 mg/dL (ref 0.3–1.2)
Total Protein: 7.3 g/dL (ref 6.5–8.1)

## 2023-01-31 LAB — GLUCOSE, CAPILLARY
Glucose-Capillary: 79 mg/dL (ref 70–99)
Glucose-Capillary: 130 mg/dL — ABNORMAL HIGH (ref 70–99)
Glucose-Capillary: 137 mg/dL — ABNORMAL HIGH (ref 70–99)

## 2023-01-31 LAB — CBG MONITORING, ED
Glucose-Capillary: 104 mg/dL — ABNORMAL HIGH (ref 70–99)
Glucose-Capillary: 76 mg/dL (ref 70–99)

## 2023-01-31 LAB — RETICULOCYTES
Immature Retic Fract: 15.9 % (ref 2.3–15.9)
RBC.: 4.62 MIL/uL (ref 4.22–5.81)
Retic Count, Absolute: 56.4 10*3/uL (ref 19.0–186.0)
Retic Ct Pct: 1.2 % (ref 0.4–3.1)

## 2023-01-31 LAB — CBC
HCT: 35.9 % — ABNORMAL LOW (ref 39.0–52.0)
Hemoglobin: 11.2 g/dL — ABNORMAL LOW (ref 13.0–17.0)
MCH: 24.2 pg — ABNORMAL LOW (ref 26.0–34.0)
MCHC: 31.2 g/dL (ref 30.0–36.0)
MCV: 77.5 fL — ABNORMAL LOW (ref 80.0–100.0)
Platelets: 245 10*3/uL (ref 150–400)
RBC: 4.63 MIL/uL (ref 4.22–5.81)
RDW: 19.2 % — ABNORMAL HIGH (ref 11.5–15.5)
WBC: 6.8 10*3/uL (ref 4.0–10.5)
nRBC: 0 % (ref 0.0–0.2)

## 2023-01-31 LAB — HEMOGLOBIN A1C
Hgb A1c MFr Bld: 7.3 % — ABNORMAL HIGH (ref 4.8–5.6)
Mean Plasma Glucose: 163 mg/dL

## 2023-01-31 LAB — IRON AND TIBC
Iron: 96 ug/dL (ref 45–182)
Saturation Ratios: 33 % (ref 17.9–39.5)
TIBC: 287 ug/dL (ref 250–450)
UIBC: 191 ug/dL

## 2023-01-31 LAB — HEPATITIS B SURFACE ANTIGEN: Hepatitis B Surface Ag: NONREACTIVE

## 2023-01-31 LAB — FERRITIN: Ferritin: 241 ng/mL (ref 24–336)

## 2023-01-31 LAB — HIV ANTIBODY (ROUTINE TESTING W REFLEX): HIV Screen 4th Generation wRfx: NONREACTIVE

## 2023-01-31 LAB — FOLATE: Folate: 10.5 ng/mL (ref >5.9)

## 2023-01-31 LAB — VITAMIN D 25 HYDROXY (VIT D DEFICIENCY, FRACTURES): Vit D, 25-Hydroxy: 22.02 ng/mL — ABNORMAL LOW (ref 30–100)

## 2023-01-31 LAB — VITAMIN B12: Vitamin B-12: 461 pg/mL (ref 180–914)

## 2023-01-31 LAB — PHOSPHORUS: Phosphorus: 8.1 mg/dL — ABNORMAL HIGH (ref 2.5–4.6)

## 2023-01-31 LAB — BRAIN NATRIURETIC PEPTIDE: B Natriuretic Peptide: >4500 pg/mL — ABNORMAL HIGH (ref 0.0–100.0)

## 2023-01-31 LAB — ALBUMIN: Albumin: 3.2 g/dL — ABNORMAL LOW (ref 3.5–5.0)

## 2023-01-31 MED ORDER — INFLUENZA VIRUS VACC SPLIT PF (FLUZONE) 0.5 ML IM SUSY: 0.5000 mL | PREFILLED_SYRINGE | INTRAMUSCULAR | Status: DC | PRN

## 2023-01-31 MED ORDER — CHLORHEXIDINE GLUCONATE CLOTH 2 % EX PADS: 6.0000 | MEDICATED_PAD | Freq: Every day | CUTANEOUS | Status: DC

## 2023-01-31 MED ORDER — PENTAFLUOROPROP-TETRAFLUOROETH EX AERO: 1.0000 | INHALATION_SPRAY | CUTANEOUS | Status: DC | PRN

## 2023-01-31 MED ORDER — HYDRALAZINE HCL 20 MG/ML IJ SOLN: 10.0000 mg | Freq: Four times a day (QID) | INTRAMUSCULAR | Status: DC | PRN

## 2023-01-31 MED ORDER — ACETAMINOPHEN 325 MG PO TABS: 650.0000 mg | ORAL_TABLET | Freq: Four times a day (QID) | ORAL | Status: DC | PRN

## 2023-01-31 MED ORDER — SENNOSIDES-DOCUSATE SODIUM 8.6-50 MG PO TABS: 1.0000 | ORAL_TABLET | Freq: Every evening | ORAL | Status: DC | PRN

## 2023-01-31 MED ORDER — SODIUM CHLORIDE 0.9% FLUSH: 3.0000 mL | INTRAVENOUS | Status: DC | PRN

## 2023-01-31 MED ORDER — ONDANSETRON HCL 4 MG PO TABS: 4.0000 mg | ORAL_TABLET | Freq: Four times a day (QID) | ORAL | Status: DC | PRN

## 2023-01-31 MED ORDER — ASPIRIN 81 MG PO TBEC
81.0000 mg | DELAYED_RELEASE_TABLET | Freq: Every day | ORAL | Status: DC
  Filled 2023-01-31: qty 1

## 2023-01-31 MED ORDER — INSULIN GLARGINE-YFGN 100 UNIT/ML ~~LOC~~ SOLN
7.0000 [IU] | Freq: Every day | SUBCUTANEOUS | Status: DC
  Administered 2023-01-31: 7 [IU] via SUBCUTANEOUS
  Filled 2023-01-31 (×2): qty 0.07

## 2023-01-31 MED ORDER — SODIUM CHLORIDE 0.9 % IV SOLN: 250.0000 mL | INTRAVENOUS | Status: DC | PRN

## 2023-01-31 MED ORDER — SODIUM CHLORIDE 0.9% FLUSH
3.0000 mL | Freq: Two times a day (BID) | INTRAVENOUS | Status: DC
  Administered 2023-01-31: 3 mL via INTRAVENOUS

## 2023-01-31 MED ORDER — CALCITRIOL 0.25 MCG PO CAPS
0.2500 ug | ORAL_CAPSULE | ORAL | Status: DC
  Filled 2023-01-31: qty 1

## 2023-01-31 MED ORDER — HEPARIN SODIUM (PORCINE) 1000 UNIT/ML DIALYSIS: 1000.0000 [IU] | INTRAMUSCULAR | Status: DC | PRN

## 2023-01-31 MED ORDER — FERROUS SULFATE 325 (65 FE) MG PO TABS
325.0000 mg | ORAL_TABLET | Freq: Every day | ORAL | Status: DC
  Administered 2023-01-31: 325 mg via ORAL
  Filled 2023-01-31: qty 1

## 2023-01-31 MED ORDER — INSULIN ASPART 100 UNIT/ML IJ SOLN: 0.0000 [IU] | Freq: Three times a day (TID) | INTRAMUSCULAR | Status: DC

## 2023-01-31 MED ORDER — ONDANSETRON HCL 4 MG/2ML IJ SOLN: 4.0000 mg | Freq: Four times a day (QID) | INTRAMUSCULAR | Status: DC | PRN

## 2023-01-31 MED ORDER — CALCIUM ACETATE (PHOS BINDER) 667 MG PO CAPS
2001.0000 mg | ORAL_CAPSULE | Freq: Three times a day (TID) | ORAL | Status: DC
  Filled 2023-01-31 (×2): qty 3

## 2023-01-31 MED ORDER — ENOXAPARIN SODIUM 30 MG/0.3ML IJ SOSY
30.0000 mg | PREFILLED_SYRINGE | INTRAMUSCULAR | Status: DC
  Filled 2023-01-31: qty 0.3

## 2023-01-31 MED ORDER — ALTEPLASE 2 MG IJ SOLR: 2.0000 mg | Freq: Once | INTRAMUSCULAR | Status: DC | PRN

## 2023-01-31 MED ORDER — PANTOPRAZOLE SODIUM 40 MG PO TBEC
40.0000 mg | DELAYED_RELEASE_TABLET | Freq: Every day | ORAL | Status: DC
  Filled 2023-01-31: qty 1

## 2023-01-31 MED ORDER — ISOSORBIDE MONONITRATE ER 60 MG PO TB24
60.0000 mg | ORAL_TABLET | Freq: Every day | ORAL | Status: DC
  Filled 2023-01-31: qty 1

## 2023-01-31 MED ORDER — ACETAMINOPHEN 650 MG RE SUPP: 650.0000 mg | Freq: Four times a day (QID) | RECTAL | Status: DC | PRN

## 2023-01-31 MED ORDER — INSULIN ASPART 100 UNIT/ML IJ SOLN: 0.0000 [IU] | Freq: Every day | INTRAMUSCULAR | Status: DC

## 2023-01-31 MED ORDER — CARVEDILOL 25 MG PO TABS
25.0000 mg | ORAL_TABLET | Freq: Two times a day (BID) | ORAL | Status: DC
  Filled 2023-01-31: qty 2

## 2023-01-31 MED ORDER — LIDOCAINE HCL (PF) 1 % IJ SOLN: 5.0000 mL | INTRAMUSCULAR | Status: DC | PRN

## 2023-01-31 MED ORDER — LIDOCAINE-PRILOCAINE 2.5-2.5 % EX CREA: 1.0000 | TOPICAL_CREAM | CUTANEOUS | Status: DC | PRN

## 2023-01-31 MED ORDER — SODIUM CHLORIDE 0.9% FLUSH: 3.0000 mL | Freq: Two times a day (BID) | INTRAVENOUS | Status: DC

## 2023-01-31 NOTE — Progress Notes (Signed)
MEWS Progress Note  Patient Details Name: [name] MRN: [id number] DOB: 09-11-1970 Today's Date: 01/31/2023   MEWS Flowsheet Documentation:  Assess: MEWS Score Temp: (!) 97.5 F (36.4 C) BP: (!) 183/95 MAP (mmHg): 120 Pulse Rate: 80 ECG Heart Rate: 81 Resp: 16 Level of Consciousness: Alert SpO2: 100 % O2 Device: Room Air Patient Activity (if Appropriate): In bed O2 Flow Rate (L/min): 0 L/min Assess: MEWS Score MEWS Temp: 0 MEWS Systolic: 0 MEWS Pulse: 0 MEWS RR: 0 MEWS LOC: 0 MEWS Score: 0 MEWS Score Color: Green Assess: SIRS CRITERIA SIRS Temperature : 0 SIRS Respirations : 0 SIRS Pulse: 0 SIRS WBC: 0 SIRS Score Sum : 0          [name] 01/31/2023, 2:24 PM

## 2023-01-31 NOTE — ED Provider Notes (Signed)
[name] Provider Note   CSN: [id number] Arrival date & time: 01/30/23  1817     History  Chief Complaint  Patient presents with   Shortness of Breath    [name] is a 52 y.o. male.  Patient presents to the emergency department requesting dialysis.  He states he believes he has not had his dialysis session since August of this year.  He states he has a hard time waking up in the morning to go to dialysis sessions.  He reports that his physician told him he needed to come to the emergency department for dialysis and that dialysis would not take him back until he was evaluated at the hospital.  He denies shortness of breath or chest pain at this time.  He does complain of abdominal distention.  He is not forthcoming with history when asked.  Past medical history significant for type II DM with hypertension and end-stage renal disease on dialysis, homelessness, chronic combined systolic and diastolic CHF   Shortness of Breath      Home Medications Prior to Admission medications   Medication Sig Start Date End Date Taking? Authorizing Provider  acetaminophen (TYLENOL) 500 MG tablet Take 1 tablet (500 mg total) by mouth every 8 (eight) hours as needed for mild pain. 05/28/22   Leroy Sea, MD  ASPIRIN LOW DOSE 81 MG tablet Take 1 tablet (81 mg total) by mouth daily. 05/28/22   Leroy Sea, MD  benzonatate (TESSALON) 100 MG capsule Take 1 capsule (100 mg total) by mouth every 8 (eight) hours. 09/09/22   Sabas Sous, MD  calcitRIOL (ROCALTROL) 0.25 MCG capsule Take 1 capsule (0.25 mcg total) by mouth every Tuesday, Thursday, and Saturday at 6 PM. 10/26/22   Ghimire, Werner Lean, MD  calcium acetate (PHOSLO) 667 MG capsule Take 3 capsules (2,001 mg total) by mouth 3 (three) times daily. 05/28/22   Leroy Sea, MD  calcium acetate (PHOSLO) 667 MG tablet Take 3 tablets (2,001 mg total) by mouth 3 (three) times daily. 12/12/22      carvedilol (COREG) 25 MG tablet Take 1 tablet (25 mg total) by mouth 2 (two) times daily with a meal. 10/26/22   Ghimire, Werner Lean, MD  carvedilol (COREG) 25 MG tablet Take 1 tablet by mouth twice a day 12/12/22     doxycycline (VIBRAMYCIN) 100 MG capsule Take 1 capsule (100 mg total) by mouth 2 (two) times daily. 01/07/23   Mardella Layman, MD  glucose blood test strip Use to check fasting blood sugar once daily. 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 10/26/22   [provider]  insulin aspart (NOVOLOG FLEXPEN) 100 UNIT/ML FlexPen 0-9 Units, Subcutaneous, 3 times daily with meals CBG < 70: Implement Hypoglycemia measures CBG 70 - 120: 0 units CBG 121 - 150: 1 unit CBG 151 - 200: 2 units CBG 201 - 250: 3 units CBG 251 - 300: 5 units CBG 301 - 350: 7 units CBG 351 - 400: 9 units CBG > 400: call MD 10/26/22   Maretta Bees, MD  insulin glargine-yfgn (SEMGLEE) 100 UNIT/ML injection Inject 0.07 mLs (7 Units total) into the skin at bedtime. 10/26/22   Ghimire, Werner Lean, MD  Insulin Pen Needle 32G X 4 MM MISC Use with insulin pens 05/28/22   Leroy Sea, MD  isosorbide mononitrate (IMDUR) 60 MG  24 hr tablet Take 1 tablet (60 mg total) by mouth daily. 10/26/22   Ghimire, Werner Lean, MD  isosorbide mononitrate (IMDUR) 60 MG 24 hr tablet Take 1 tablet (60 mg total) by mouth Nightly. 12/12/22     melatonin 3 MG TABS tablet Take 1 tablet (3 mg total) by mouth at bedtime as needed (insomnia). 10/26/22   Ghimire, Werner Lean, MD  mupirocin ointment (BACTROBAN) 2 % Apply 1 Application topically 3 (three) times daily. 01/07/23   Mardella Layman, MD  oxyCODONE-acetaminophen (PERCOCET/ROXICET) 5-325 MG tablet Take 1 tablet by mouth every 12 (twelve) hours as needed for severe pain. 10/26/22   Ghimire, Werner Lean, MD  PROTONIX 40 MG tablet Take 1 tablet (40 mg total) by mouth daily. 05/25/22   Leroy Sea, MD  simethicone (MYLICON)  80 MG chewable tablet Chew 1 tablet (80 mg total) by mouth 4 (four) times daily for 5 days. 11/22/22 12/17/22  Rodolph Bong, MD  gabapentin (NEURONTIN) 300 MG capsule Take 1 capsule (300 mg total) by mouth 3 (three) times daily. Patient not taking: Reported on 07/27/2019 07/29/18 08/30/19  Kallie Locks, FNP  sildenafil (VIAGRA) 25 MG tablet Take 1 tablet (25 mg total) by mouth as needed for erectile dysfunction. for erectile dysfunction. Patient not taking: Reported on 08/11/2020 10/13/19 11/27/20  Kallie Locks, FNP      Allergies    Sulfa antibiotics, Other, Bactrim [sulfamethoxazole-trimethoprim], and Cephalexin    Review of Systems   Review of Systems  Respiratory:  Positive for shortness of breath.     Physical Exam Updated Vital Signs BP (!) 171/101   Pulse 81   Temp 98 F (36.7 C)   Resp 18   Ht 5\' 7"  (1.702 m)   Wt 82.9 kg   SpO2 94%   BMI 28.62 kg/m  Physical Exam Vitals and nursing note reviewed.  Constitutional:      General: He is not in acute distress.    Appearance: He is well-developed.  HENT:     Head: Normocephalic and atraumatic.  Eyes:     Conjunctiva/sclera: Conjunctivae normal.  Cardiovascular:     Rate and Rhythm: Normal rate and regular rhythm.  Pulmonary:     Effort: Pulmonary effort is normal. No respiratory distress.     Breath sounds: Normal breath sounds. No rales.  Abdominal:     Palpations: Abdomen is soft.     Tenderness: There is no abdominal tenderness.     Comments: Abdomen is distended  Musculoskeletal:        General: No swelling.     Cervical back: Neck supple.     Right lower leg: No edema.     Left lower leg: No edema.  Skin:    General: Skin is warm and dry.     Capillary Refill: Capillary refill takes less than 2 seconds.  Neurological:     Mental Status: He is alert.  Psychiatric:        Mood and Affect: Mood normal.     ED Results / Procedures / Treatments   Labs (all labs ordered are listed, but only  abnormal results are displayed) Labs Reviewed  BASIC METABOLIC PANEL - Abnormal; Notable for the following components:      Result Value   CO2 10 (*)    Glucose, Bld 110 (*)    BUN 122 (*)    Creatinine, Ser 18.04 (*)    Calcium 6.2 (*)    GFR, Estimated 3 (*)  Anion gap 24 (*)    All other components within normal limits  CBC - Abnormal; Notable for the following components:   Hemoglobin 11.8 (*)    HCT 38.3 (*)    MCV 78.8 (*)    MCH 24.3 (*)    RDW 19.4 (*)    All other components within normal limits  CBG MONITORING, ED - Abnormal; Notable for the following components:   Glucose-Capillary 104 (*)    All other components within normal limits  HEPATITIS B SURFACE ANTIGEN  HEPATITIS B SURFACE ANTIBODY, QUANTITATIVE  HIV ANTIBODY (ROUTINE TESTING W REFLEX)  COMPREHENSIVE METABOLIC PANEL  CBC    EKG None  Radiology DG Chest 2 View  Result Date: 01/30/2023 CLINICAL DATA:  Exertional shortness of breath. EXAM: CHEST - 2 VIEW COMPARISON:  January 13, 2023 FINDINGS: There is stable moderate severity cardiac silhouette enlargement. Mild to moderate severity prominence of the central pulmonary vasculature is seen. Mild, chronic appearing increased lung markings are noted. Mild atelectasis is seen within the mid left lung. No pleural effusion or pneumothorax is identified. A chronic fracture deformity is seen involving the distal left clavicle. IMPRESSION: 1. Stable cardiomegaly with mild to moderate severity central pulmonary vascular congestion. Electronically Signed   By: Aram Candela M.D.   On: 01/30/2023 20:42    Procedures Procedures    Medications Ordered in ED Medications  Chlorhexidine Gluconate Cloth 2 % PADS 6 each (has no administration in time range)  aspirin EC tablet 81 mg (has no administration in time range)  carvedilol (COREG) tablet 25 mg (has no administration in time range)  isosorbide mononitrate (IMDUR) 24 hr tablet 60 mg (has no administration in  time range)  calcitRIOL (ROCALTROL) capsule 0.25 mcg (has no administration in time range)  insulin glargine-yfgn (SEMGLEE) injection 7 Units (has no administration in time range)  calcium acetate (PHOSLO) capsule 2,001 mg (has no administration in time range)  pantoprazole (PROTONIX) EC tablet 40 mg (has no administration in time range)  enoxaparin (LOVENOX) injection 40 mg (has no administration in time range)  sodium chloride flush (NS) 0.9 % injection 3 mL (has no administration in time range)  sodium chloride flush (NS) 0.9 % injection 3 mL (has no administration in time range)  sodium chloride flush (NS) 0.9 % injection 3 mL (has no administration in time range)  0.9 %  sodium chloride infusion (has no administration in time range)  acetaminophen (TYLENOL) tablet 650 mg (has no administration in time range)    Or  acetaminophen (TYLENOL) suppository 650 mg (has no administration in time range)  senna-docusate (Senokot-S) tablet 1 tablet (has no administration in time range)  ondansetron (ZOFRAN) tablet 4 mg (has no administration in time range)    Or  ondansetron (ZOFRAN) injection 4 mg (has no administration in time range)  hydrALAZINE (APRESOLINE) injection 10 mg (has no administration in time range)    ED Course/ Medical Decision Making/ A&P                                 Medical Decision Making Amount and/or Complexity of Data Reviewed Labs: ordered. Radiology: ordered.  Risk Decision regarding hospitalization.   This patient presents to the ED for concern of requesting dialysis, this involves an extensive number of treatment options, and is a complaint that carries with it a high risk of complications and morbidity.    Co morbidities that complicate the patient  evaluation  History of noncompliance with dialysis, end-stage renal disease, type II DM (poorly controlled)   Additional history obtained:   External records from outside source obtained and reviewed  including notes from nephrology, appears that patient's last dialysis treatment may have been on August 31   Lab Tests:  I Ordered, and personally interpreted labs.  The pertinent results include: Calcium 6.2, anion gap 24, BUN 122, potassium 4.6   Imaging Studies ordered:  I ordered imaging studies including chest x-ray I independently visualized and interpreted imaging which showed  1. Stable cardiomegaly with mild to moderate severity central  pulmonary vascular congestion.   I agree with the radiologist interpretation   Cardiac Monitoring: / EKG:  The patient was maintained on a cardiac monitor.  I personally viewed and interpreted the cardiac monitored which showed an underlying rhythm of: Sinus rhythm   Consultations Obtained:  I requested consultation with the nephrologist, Dr.Peeples, and discussed lab and imaging findings as well as pertinent plan - they recommend: Patient will likely need multiple dialysis sessions  Requested consultation with the hospitalist.  Dr.Sundil agreed to see the patient for admission   Social Determinants of Health:  Patient with history of housing insecurity   Test / Admission - Considered:  Patient has been noncompliant with dialysis for approximately 2 weeks and has a history of the same.  Will likely need multiple sessions in order to remove excess fluid.  Patient needs admission to the hospital for further evaluation and management.         Final Clinical Impression(s) / ED Diagnoses Final diagnoses:  Generalized edema due to fluid overload  Admission for acute hemodialysis [facility])    Rx / DC Orders ED Discharge Orders     None         Pamala Duffel 01/31/23 0417    Marily Memos, MD 01/31/23 [id number]

## 2023-01-31 NOTE — H&P (Addendum)
History and Physical    SEBASTIN DABDOUB BPZ:[id number] DOB: Jul 28, 1970 DOA: 01/30/2023  PCP: [name]   Patient coming from: Unfortunately patient is homeless   Chief Complaint:  Chief Complaint  Patient presents with   Shortness of Breath    HPI:  [name] is a 52 y.o. male with medical history significant of ESRD on HD TTS schedule, congestive heart failure reduced EF 20 to 25%, diabetes type 2, essential hypertension, generalized anxiety disorder, anemia of chronic disease and GERD presented to emergency department requesting for dialysis.  Patient reported that he did not attend any dialysis sessions in August 2024.  Patient reported his physician told him to come emergency department for dialysis and the dialysis unit will not take him back until he is evaluated in the ED.  At this time patient denies any shortness of breath and chest pain.  He is complaining about abdominal distention. Patient is not a good historian.  He is not very cooperative with the history as well.  History mostly obtained from chart review and from the ED physician.  ED Course:  At presentation to ED patient's respiratory rate 18, heart rate 85, blood pressure elevated 182/89 and O2 sat 99% room air.  BMP showed sodium 139, potassium 4.6, chloride 102, bicarb 10, blood glucose 110, elevated BUN 122, low calcium 6.2, elevated anion gap 24.  CBC grossly unremarkable except low hemoglobin 11.8 and hematocrit 38.  Chest x-ray showed stable cardiomegaly mild to moderate severity central pulmonary vascular congestion.  ED physician Dr. Marcelline Deist consulted nephrology Dr. Valentino Nose who recommended patient need serial dialysis for next 1 to 3 days.  Hospitalist has been contacted for further evaluation and management.  Review of Systems:  Review of Systems  Unable to perform ROS: Other (Patient is a poor historian and is not willing to talk with me)  Respiratory:  Negative for shortness of breath.    Cardiovascular:  Negative for chest pain, orthopnea and leg swelling.  Neurological:  Negative for dizziness and headaches.  Psychiatric/Behavioral:  The patient is not nervous/anxious.     Past Medical History:  Diagnosis Date   Anxiety 02/2019   Anxiety 10/2019   Diabetes mellitus type II, uncontrolled 07/17/2006   Elevated alkaline phosphatase level 01/2019   ESSENTIAL HYPERTENSION 07/17/2006   Homelessness 10/13/2021   HYPERLIPIDEMIA 11/24/2008   Insomnia 02/2019   Left bundle branch block 02/22/2019   Lesion of lip 10/2019   Pneumonia 10/22/2011   Renal disorder    Suicidal ideations 02/2019   Vitamin D deficiency 01/2019    Past Surgical History:  Procedure Laterality Date   ARM SURGERY     AV FISTULA PLACEMENT Right 02/20/2021   Procedure: RIGHT ARTERIOVENOUS GRAFT CREATION;  Surgeon: Chuck Hint, MD;  Location: [facility] OR;  Service: Vascular;  Laterality: Right;   INSERTION OF DIALYSIS CATHETER Left 02/20/2021   Procedure: INSERTION OF DIALYSIS CATHETER USING PALINDROME CATHETER;  Surgeon: Chuck Hint, MD;  Location: [facility] OR;  Service: Vascular;  Laterality: Left;   IR AV DIALY SHUNT INTRO NEEDLE/INTRACATH INITIAL W/PTA/IMG RIGHT Right 11/06/2021   IR FLUORO GUIDE CV LINE RIGHT  09/12/2022   IR REMOVAL TUN CV CATH W/O FL  07/27/2021   IR THROMBECTOMY AV FISTULA W/THROMBOLYSIS INC/SHUNT/IMG RIGHT  09/11/2022   IR US GUIDE VASC ACCESS RIGHT  11/06/2021   IR US GUIDE VASC ACCESS RIGHT  09/11/2022   IR US GUIDE VASC ACCESS RIGHT  09/12/2022   TEE WITHOUT CARDIOVERSION N/A  10/25/2022   Procedure: TRANSESOPHAGEAL ECHOCARDIOGRAM;  Surgeon: Chilton Si, MD;  Location: [facility] INVASIVE CV LAB;  Service: Cardiovascular;  Laterality: N/A;   ULTRASOUND GUIDANCE FOR VASCULAR ACCESS  02/20/2021   Procedure: ULTRASOUND GUIDANCE FOR VASCULAR ACCESS;  Surgeon: Chuck Hint, MD;  Location: [facility] OR;  Service: Vascular;;     reports that he has never smoked. He has never  used smokeless tobacco. He reports that he does not drink alcohol and does not use drugs.  Allergies  Allergen Reactions   Sulfa Antibiotics Rash    Severe rash.  Severe rash.     Severe rash.   Other Other (See Comments)    Patient states he is allergic to the TB test - unknown reaction during childhood    Bactrim [Sulfamethoxazole-Trimethoprim] Rash   Cephalexin Other (See Comments) and Rash    Patient with severe drug reaction including exfoliating skin rash and hypotension after being prescribed both cephalexin and sulfamethoxazole-trimethoprim simultaneously. Favor SMX as much more likely culprit as patient had tolerated other cephalosporins in the past, but cannot say with complete certainty that this was not related to cephalexin.  Other Reaction(s): Other, Other (See Comments)  Patient with severe drug reaction including exfoliating skin rash and hypotension after being prescribed both cephalexin and sulfamethoxazole-trimethoprim simultaneously. Favor SMX as much more likely culprit as patient had tolerated other cephalosporins in the past, but cannot say with complete certainty that this was not related to cephalexin.   "Patient with severe drug reaction including exfoliating skin rash and hypotension after being prescribed both cephalexin and sulfamethoxazole-trimethoprim simultaneously. Favor SMX as much more likely culprit as patient had tolerated other cephalosporins in the past, but cannot say with complete certainty that this was not related to cephalexin." As noted in [facility]   "Patient with severe drug reaction including exfoliating skin rash and hypotension after being prescribed both cephalexin and sulfamethoxazole-trimethoprim simultaneously. Favor SMX as much more likely culprit as patient had tolerated other cephalosporins in the past, but cannot say with complete certainty that this was not related to cephalexin." As noted in [facility]   "Patient with severe drug  reaction including exfoliating skin rash and hypotension after being prescribed both cephalexin and sulfamethoxazole-trimethoprim simultaneously. Favor SMX as much more likely culprit as patient had tolerated other cephalosporins in the past, but cannot say with complete certainty that this was not related to cephalexin." As noted in [facility]     Patient with severe drug reaction including exfoliating skin rash and hypotension after being prescribed both cephalexin and sulfamethoxazole-trimethoprim simultaneously. Favor SMX as much more likely culprit as patient had tolerated other cephalosporins in the past, but cannot say with complete certainty that this was not related to cephalexin.  "Patient with severe drug reaction including exfoliating skin rash and hypotension after being prescribed both cephalexin and sulfamethoxazole-trimethoprim simultaneously. Favor SMX as much more likely culprit as patient had tolerated other cephalosporins in the past, but cannot say with complete certainty that this was not related to cephalexin." As noted in [facility]  "Patient with severe drug reaction including exfoliating skin rash and hypotension after being prescribed both cephalexin and sulfamethoxazole-trimethoprim simultaneously. Favor SMX as much more likely culprit as patient had tolerated other cephalosporins in the past, but cannot say with complete certainty that this was not related to cephalexin." As noted in [facility]     Patient with severe drug reaction inc... (TRUNCATED)    Family History  Problem Relation Age of Onset  Hypertension Mother 38   Cancer Mother        bladder cancer   Alcohol abuse Father    Hypertension Brother     Prior to Admission medications   Medication Sig Start Date End Date Taking? Authorizing Provider  acetaminophen (TYLENOL) 500 MG tablet Take 1 tablet (500 mg total) by mouth every 8 (eight) hours as needed for mild pain. 05/28/22   Leroy Sea, MD  ASPIRIN  LOW DOSE 81 MG tablet Take 1 tablet (81 mg total) by mouth daily. 05/28/22   Leroy Sea, MD  benzonatate (TESSALON) 100 MG capsule Take 1 capsule (100 mg total) by mouth every 8 (eight) hours. 09/09/22   Sabas Sous, MD  calcitRIOL (ROCALTROL) 0.25 MCG capsule Take 1 capsule (0.25 mcg total) by mouth every Tuesday, Thursday, and Saturday at 6 PM. 10/26/22   Ghimire, Werner Lean, MD  calcium acetate (PHOSLO) 667 MG capsule Take 3 capsules (2,001 mg total) by mouth 3 (three) times daily. 05/28/22   Leroy Sea, MD  calcium acetate (PHOSLO) 667 MG tablet Take 3 tablets (2,001 mg total) by mouth 3 (three) times daily. 12/12/22     carvedilol (COREG) 25 MG tablet Take 1 tablet (25 mg total) by mouth 2 (two) times daily with a meal. 10/26/22   Ghimire, Werner Lean, MD  carvedilol (COREG) 25 MG tablet Take 1 tablet by mouth twice a day 12/12/22     doxycycline (VIBRAMYCIN) 100 MG capsule Take 1 capsule (100 mg total) by mouth 2 (two) times daily. 01/07/23   Mardella Layman, MD  glucose blood test strip Use to check fasting blood sugar once daily. 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 10/26/22   [provider]  insulin aspart (NOVOLOG FLEXPEN) 100 UNIT/ML FlexPen 0-9 Units, Subcutaneous, 3 times daily with meals CBG < 70: Implement Hypoglycemia measures CBG 70 - 120: 0 units CBG 121 - 150: 1 unit CBG 151 - 200: 2 units CBG 201 - 250: 3 units CBG 251 - 300: 5 units CBG 301 - 350: 7 units CBG 351 - 400: 9 units CBG > 400: call MD 10/26/22   Maretta Bees, MD  insulin glargine-yfgn (SEMGLEE) 100 UNIT/ML injection Inject 0.07 mLs (7 Units total) into the skin at bedtime. 10/26/22   Ghimire, Werner Lean, MD  Insulin Pen Needle 32G X 4 MM MISC Use with insulin pens 05/28/22   Leroy Sea, MD  isosorbide mononitrate (IMDUR) 60 MG 24 hr tablet Take 1 tablet (60 mg total) by mouth daily. 10/26/22   Ghimire, Werner Lean, MD   isosorbide mononitrate (IMDUR) 60 MG 24 hr tablet Take 1 tablet (60 mg total) by mouth Nightly. 12/12/22     melatonin 3 MG TABS tablet Take 1 tablet (3 mg total) by mouth at bedtime as needed (insomnia). 10/26/22   Ghimire, Werner Lean, MD  mupirocin ointment (BACTROBAN) 2 % Apply 1 Application topically 3 (three) times daily. 01/07/23   Mardella Layman, MD  oxyCODONE-acetaminophen (PERCOCET/ROXICET) 5-325 MG tablet Take 1 tablet by mouth every 12 (twelve) hours as needed for severe pain. 10/26/22   Ghimire, Werner Lean, MD  PROTONIX 40 MG tablet Take 1 tablet (40 mg total) by mouth daily. 05/25/22   Leroy Sea, MD  simethicone (MYLICON) 80 MG chewable tablet Chew 1 tablet (80 mg total) by mouth 4 (four) times daily for 5 days. 11/22/22 12/17/22  Rodolph Bong, MD  gabapentin (NEURONTIN) 300 MG capsule Take 1 capsule (300 mg total) by mouth 3 (three) times daily. Patient not taking: Reported on 07/27/2019 07/29/18 08/30/19  Kallie Locks, FNP  sildenafil (VIAGRA) 25 MG tablet Take 1 tablet (25 mg total) by mouth as needed for erectile dysfunction. for erectile dysfunction. Patient not taking: Reported on 08/11/2020 10/13/19 11/27/20  Kallie Locks, FNP     Physical Exam: Vitals:   01/31/23 0145 01/31/23 0200 01/31/23 0215 01/31/23 0313  BP: (!) 152/87 (!) 164/85  (!) 171/101  Pulse: 84 83 83 81  Resp: 14 18 13 18   Temp:    98 F (36.7 C)  TempSrc:      SpO2: 100% (!) 87% 100% 94%  Weight:      Height:        Physical Exam Constitutional:      General: He is not in acute distress.    Appearance: He is not ill-appearing.  HENT:     Mouth/Throat:     Mouth: Mucous membranes are moist.  Cardiovascular:     Rate and Rhythm: Normal rate and regular rhythm.  Musculoskeletal:     Cervical back: Neck supple.      Labs on Admission: I have personally reviewed following labs and imaging studies  CBC: Recent Labs  Lab 01/30/23 1953  WBC 6.6  HGB 11.8*  HCT 38.3*  MCV 78.8*  PLT  265   Basic Metabolic Panel: Recent Labs  Lab 01/30/23 1953  NA 139  K 4.6  CL 105  CO2 10*  GLUCOSE 110*  BUN 122*  CREATININE 18.04*  CALCIUM 6.2*   GFR: Estimated Creatinine Clearance: 5.1 mL/min (A) (by C-G formula based on SCr of 18.04 mg/dL (H)). Liver Function Tests: No results for input(s): "AST", "ALT", "ALKPHOS", "BILITOT", "PROT", "ALBUMIN" in the last 168 hours. No results for input(s): "LIPASE", "AMYLASE" in the last 168 hours. No results for input(s): "AMMONIA" in the last 168 hours. Coagulation Profile: No results for input(s): "INR", "PROTIME" in the last 168 hours. Cardiac Enzymes: No results for input(s): "CKTOTAL", "CKMB", "CKMBINDEX", "TROPONINI", "TROPONINIHS" in the last 168 hours. BNP (last 3 results) Recent Labs    12/05/22 0004 12/31/22 0121 01/04/23 1628  BNP >4,500.0* >4,500.0* >4,500.0*   HbA1C: No results for input(s): "HGBA1C" in the last 72 hours. CBG: Recent Labs  Lab 01/31/23 0046  GLUCAP 104*   Lipid Profile: No results for input(s): "CHOL", "HDL", "LDLCALC", "TRIG", "CHOLHDL", "LDLDIRECT" in the last 72 hours. Thyroid Function Tests: No results for input(s): "TSH", "T4TOTAL", "FREET4", "T3FREE", "THYROIDAB" in the last 72 hours. Anemia Panel: No results for input(s): "VITAMINB12", "FOLATE", "FERRITIN", "TIBC", "IRON", "RETICCTPCT" in the last 72 hours. Urine analysis:    Component Value Date/Time   COLORURINE YELLOW 02/19/2021 0045   APPEARANCEUR HAZY (A) 02/19/2021 0045   APPEARANCEUR Clear 05/09/2020 1509   LABSPEC 1.013 02/19/2021 0045   PHURINE 5.0 02/19/2021 0045   GLUCOSEU >=500 (A) 02/19/2021 0045   HGBUR MODERATE (A) 02/19/2021 0045   BILIRUBINUR NEGATIVE 02/19/2021 0045   BILIRUBINUR Negative 05/09/2020 1509   KETONESUR NEGATIVE 02/19/2021 0045   PROTEINUR >=300 (A) 02/19/2021 0045   UROBILINOGEN 0.2 02/04/2020 1612   UROBILINOGEN 0.2 02/16/2014 0045   NITRITE NEGATIVE 02/19/2021 0045   LEUKOCYTESUR NEGATIVE  02/19/2021 0045    Radiological Exams on Admission: I have personally reviewed images DG Chest 2 View  Result Date: 01/30/2023 CLINICAL DATA:  Exertional shortness of breath. EXAM: CHEST -  2 VIEW COMPARISON:  January 13, 2023 FINDINGS: There is stable moderate severity cardiac silhouette enlargement. Mild to moderate severity prominence of the central pulmonary vasculature is seen. Mild, chronic appearing increased lung markings are noted. Mild atelectasis is seen within the mid left lung. No pleural effusion or pneumothorax is identified. A chronic fracture deformity is seen involving the distal left clavicle. IMPRESSION: 1. Stable cardiomegaly with mild to moderate severity central pulmonary vascular congestion. Electronically Signed   By: Aram Candela M.D.   On: 01/30/2023 20:42    EKG: My personal interpretation of EKG shows: Sinus rhythm heart rate 82.  Left atrial enlargement.  There is no ST and T of abnormality.    Assessment/Plan: Principal Problem:   Volume overload Active Problems:   Noncompliance with renal dialysis   ESRD on dialysis (HCC) noncompliant with dialysis   Hypocalcemia   Chronic systolic CHF reduced EF 20 to 65% (congestive heart failure) (HCC)   Insulin dependent type 2 diabetes mellitus (HCC)   Essential hypertension   Hypertensive urgency   GAD (generalized anxiety disorder)   GERD (gastroesophageal reflux disease)   Anemia of chronic disease    Assessment and Plan: Volume overload secondary to missing dialysis ESRD on HD TTS schedule Noncompliant with dialysis (secondary to homelessness and transportation issues) Anion gap metabolic acidosis-due to missing dialysis -Patient is presenting with complaining of gaining weight and unable to get access to dialysis unit for last 2 weeks. - On presentation to ED patient found elevated blood pressure 182/89. -BMP showed elevated BUN 122, low calciums 6.2, elevated anion gap 24 and low bicarb 10.  Potassium  4.6 WNL. -Chest x-ray showed stable cardiomegaly with mild to moderate severity central pulmonary vascular congestion. -Checking BNP level -ED physician Dr. Marcelline Deist consulted nephrology Dr. Valentino Nose, per nephrology Dr. Valentino Nose plan to continue serial dialysis for next few days. -Appreciate nephrology input. -Continue cardiac monitoring. -Continue monitor CBC and CMP.  Hypertensive urgency Essential hypertension Chronic systolic heart failure reduced EF 20 to 25% -Patient's blood pressure found elevated 182/89 and heart rate 83.  EKG showed sinus rhythm and evidence of left atrial enlargement. -Chest x-ray showed pulmonary vascular congestion. - Hypertensive urgency in the setting of medication noncompliance. - Resumed previous medication includes Coreg 25 mg twice daily and Imdur 60 mg daily.  Will give blood pressure regimen after the dialysis session. - Starting hydralazine 10 mg every 6 hour as needed for systolic blood pressure more than 180 or diastolic blood pressure more than 110.  Hypocalcemia secondary to ESRD -BMP showed low calcium 6.2.   -Checking ionized calcium and albumin level.  Checking 25-hydroxy vitamin D 2 and PTH level. - Resuming calcitriol 0.25 mcg 3 times weekly and PhosLo 2001 milligram 3 times daily.  Insulin-dependent DM type II -High suspicion that patient is noncompliant with insulin regimen. - Starting Semglee 7 units at bedtime, continue POC blood glucose checks 3 times daily with meal and bedtime with sliding scale insulin and at bedtime insulin as needed coverage. - Checking A1c level.  Anemia of chronic disease Microcytic anemia -CBC showed stable H&H 11.9 at 38.3.  Low MCV 78. - Checking anemia panel - Starting oral iron supplement.  GERD -Continue Protonix 40 mg daily   DVT prophylaxis:  Lovenox Code Status:  Full Code Diet: Heart healthy, carb modified diet with fluid restriction 1.5 later in the day Family Communication:  None Disposition  Plan: Pending dialysis session. Consults: Nephrology and transition care team Admission status:   Inpatient,  Telemetry bed  Severity of Illness: The appropriate patient status for this patient is INPATIENT. Inpatient status is judged to be reasonable and necessary in order to provide the required intensity of service to ensure the patient's safety. The patient's presenting symptoms, physical exam findings, and initial radiographic and laboratory data in the context of their chronic comorbidities is felt to place them at high risk for further clinical deterioration. Furthermore, it is not anticipated that the patient will be medically stable for discharge from the hospital within 2 midnights of admission.   * I certify that at the point of admission it is my clinical judgment that the patient will require inpatient hospital care spanning beyond 2 midnights from the point of admission due to high intensity of service, high risk for further deterioration and high frequency of surveillance required.Marland Kitchen    Tereasa Coop, MD [facility]  How to contact the [facility] Attending or Consulting provider 7A - 7P or covering provider during after hours 7P -7A, for this patient.  Check the care team in [facility] and look for a) attending/consulting TRH provider listed and b) the [facility] team listed Log into [email or web address] and use [facility] universal password to access. If you do not have the password, please contact the hospital operator. Locate the [facility] provider you are looking for under [facility] and page to a number that you can be directly reached. If you still have difficulty reaching the provider, please page the [facility] (Director on Call) for the Hospitalists listed on amion for assistance.  01/31/2023, 4:34 AM

## 2023-01-31 NOTE — Progress Notes (Signed)
Transition of Care [name] - Inpatient Brief Assessment   Patient Details  Name: [name] MRN: [id number] Date of Birth: 23-Jun-1970  [facility] CM/SW Contact:    Janae Bridgeman, RN Phone Number: 01/31/2023, 2:36 PM   Clinical Narrative: CM attempted to meet with the patient at the bedside but I was unable to awaken the patient from sleep to discuss TOC needs at this time.  I asked that patient where he was living and his only response was "nowhere " and I was unable to obtain information to discuss needs at this time.  TOC Team will continue to follow the patient and further discuss needs when patient is able to communicate needs. Patient has history of homelessness and missed appointments with dialysis center per [facility], Renal Navigator.   Transition of Care Asessment: Insurance and Status: (P) Insurance coverage has been reviewed Patient has primary care physician: (P) Yes Home environment has been reviewed: (P) Patient is homeless and living outside Prior level of function:: (P) Independent Prior/Current Home Services: (P) No current home services Social Determinants of Health Reivew: (P) SDOH reviewed needs interventions Readmission risk has been reviewed: (P) Yes Transition of care needs: (P) transition of care needs identified, TOC will continue to follow

## 2023-01-31 NOTE — Progress Notes (Signed)
Pt is assigned to [facility]) on TTS for out-pt HD. Will assist as needed.   Olivia Canter Renal Navigator [phone]

## 2023-01-31 NOTE — ED Notes (Signed)
ED TO INPATIENT HANDOFF REPORT  ED Nurse Name and Phone #: Marchelle Folks [phone]  S Name/Age/Gender [name] 52 y.o. male Room/Bed: 010C/010C  Code Status   Code Status: Full Code  Home/SNF/Other Home Patient oriented to: self, place, time, and situation Is this baseline? Yes   Triage Complete: Triage complete  Chief Complaint Volume overload [E87.70]  Triage Note Pt to ED for dialysis. Last session 3 weeks ago - unsure why he has missed treatments. States he feels "bad" but will not elaborate. Denies CP. Exertional SHOB.    Allergies Allergies  Allergen Reactions   Sulfa Antibiotics Rash    Severe rash.  Severe rash.     Severe rash.   Other Other (See Comments)    Patient states he is allergic to the TB test - unknown reaction during childhood    Bactrim [Sulfamethoxazole-Trimethoprim] Rash   Cephalexin Other (See Comments) and Rash    Patient with severe drug reaction including exfoliating skin rash and hypotension after being prescribed both cephalexin and sulfamethoxazole-trimethoprim simultaneously. Favor SMX as much more likely culprit as patient had tolerated other cephalosporins in the past, but cannot say with complete certainty that this was not related to cephalexin.  Other Reaction(s): Other, Other (See Comments)  Patient with severe drug reaction including exfoliating skin rash and hypotension after being prescribed both cephalexin and sulfamethoxazole-trimethoprim simultaneously. Favor SMX as much more likely culprit as patient had tolerated other cephalosporins in the past, but cannot say with complete certainty that this was not related to cephalexin.   "Patient with severe drug reaction including exfoliating skin rash and hypotension after being prescribed both cephalexin and sulfamethoxazole-trimethoprim simultaneously. Favor SMX as much more likely culprit as patient had tolerated other cephalosporins in the past, but cannot say with complete certainty  that this was not related to cephalexin." As noted in [facility]   "Patient with severe drug reaction including exfoliating skin rash and hypotension after being prescribed both cephalexin and sulfamethoxazole-trimethoprim simultaneously. Favor SMX as much more likely culprit as patient had tolerated other cephalosporins in the past, but cannot say with complete certainty that this was not related to cephalexin." As noted in [facility]   "Patient with severe drug reaction including exfoliating skin rash and hypotension after being prescribed both cephalexin and sulfamethoxazole-trimethoprim simultaneously. Favor SMX as much more likely culprit as patient had tolerated other cephalosporins in the past, but cannot say with complete certainty that this was not related to cephalexin." As noted in [facility]     Patient with severe drug reaction including exfoliating skin rash and hypotension after being prescribed both cephalexin and sulfamethoxazole-trimethoprim simultaneously. Favor SMX as much more likely culprit as patient had tolerated other cephalosporins in the past, but cannot say with complete certainty that this was not related to cephalexin.  "Patient with severe drug reaction including exfoliating skin rash and hypotension after being prescribed both cephalexin and sulfamethoxazole-trimethoprim simultaneously. Favor SMX as much more likely culprit as patient had tolerated other cephalosporins in the past, but cannot say with complete certainty that this was not related to cephalexin." As noted in [facility]  "Patient with severe drug reaction including exfoliating skin rash and hypotension after being prescribed both cephalexin and sulfamethoxazole-trimethoprim simultaneously. Favor SMX as much more likely culprit as patient had tolerated other cephalosporins in the past, but cannot say with complete certainty that this was not related to cephalexin." As noted in [facility]     Patient with  severe drug reaction  inc... (TRUNCATED)    Level of Care/Admitting Diagnosis ED Disposition     ED Disposition  Admit   Condition  --   Comment  Hospital Area: [facility] [[id number]]  Level of Care: Telemetry Medical [104]  May admit patient to [facility] or [facility] if equivalent level of care is available:: No  Covid Evaluation: Asymptomatic - no recent exposure (last 10 days) testing not required  Diagnosis: Volume overload [161096]  Admitting Physician: Tereasa Coop [[id number]]  Attending Physician: Tereasa Coop [[id number]]  Certification:: I certify this patient will need inpatient services for at least 2 midnights  Expected Medical Readiness: 02/04/2023          B Medical/Surgery History Past Medical History:  Diagnosis Date   Anxiety 02/2019   Anxiety 10/2019   Diabetes mellitus type II, uncontrolled 07/17/2006   Elevated alkaline phosphatase level 01/2019   ESSENTIAL HYPERTENSION 07/17/2006   Homelessness 10/13/2021   HYPERLIPIDEMIA 11/24/2008   Insomnia 02/2019   Left bundle branch block 02/22/2019   Lesion of lip 10/2019   Pneumonia 10/22/2011   Renal disorder    Suicidal ideations 02/2019   Vitamin D deficiency 01/2019   Past Surgical History:  Procedure Laterality Date   ARM SURGERY     AV FISTULA PLACEMENT Right 02/20/2021   Procedure: RIGHT ARTERIOVENOUS GRAFT CREATION;  Surgeon: Chuck Hint, MD;  Location: [facility] OR;  Service: Vascular;  Laterality: Right;   INSERTION OF DIALYSIS CATHETER Left 02/20/2021   Procedure: INSERTION OF DIALYSIS CATHETER USING PALINDROME CATHETER;  Surgeon: Chuck Hint, MD;  Location: [facility] OR;  Service: Vascular;  Laterality: Left;   IR AV DIALY SHUNT INTRO NEEDLE/INTRACATH INITIAL W/PTA/IMG RIGHT Right 11/06/2021   IR FLUORO GUIDE CV LINE RIGHT  09/12/2022   IR REMOVAL TUN CV CATH W/O FL  07/27/2021   IR THROMBECTOMY AV FISTULA W/THROMBOLYSIS INC/SHUNT/IMG RIGHT  09/11/2022   IR US GUIDE  VASC ACCESS RIGHT  11/06/2021   IR US GUIDE VASC ACCESS RIGHT  09/11/2022   IR US GUIDE VASC ACCESS RIGHT  09/12/2022   TEE WITHOUT CARDIOVERSION N/A 10/25/2022   Procedure: TRANSESOPHAGEAL ECHOCARDIOGRAM;  Surgeon: Chilton Si, MD;  Location: [facility] INVASIVE CV LAB;  Service: Cardiovascular;  Laterality: N/A;   ULTRASOUND GUIDANCE FOR VASCULAR ACCESS  02/20/2021   Procedure: ULTRASOUND GUIDANCE FOR VASCULAR ACCESS;  Surgeon: Chuck Hint, MD;  Location: [facility] OR;  Service: Vascular;;     A IV Location/Drains/Wounds Patient Lines/Drains/Airways Status     Active Line/Drains/Airways     Name Placement date Placement time Site Days   Fistula / Graft Right Forearm --  --  Forearm  --   Wound / Incision (Open or Dehisced) 09/12/22 Skin tear Elbow Left 09/12/22  --  Elbow  141            Intake/Output Last 24 hours No intake or output data in the 24 hours ending 01/31/23 0738  Labs/Imaging Results for orders placed or performed during the hospital encounter of 01/30/23 (from the past 48 hour(s))  Basic metabolic panel     Status: Abnormal   Collection Time: 01/30/23  7:53 PM  Result Value Ref Range   Sodium 139 135 - 145 mmol/L   Potassium 4.6 3.5 - 5.1 mmol/L   Chloride 105 98 - 111 mmol/L   CO2 10 (L) 22 - 32 mmol/L   Glucose, Bld 110 (H) 70 - 99 mg/dL    Comment: Glucose reference range applies only to samples  taken after fasting for at least 8 hours.   BUN 122 (H) 6 - 20 mg/dL   Creatinine, Ser 95.62 (H) 0.61 - 1.24 mg/dL   Calcium 6.2 (LL) 8.9 - 10.3 mg/dL    Comment: CRITICAL RESULT CALLED TO, READ BACK BY AND VERIFIED WITH TERRY, R. RN @ 2115 01/30/23 JBUTLER   GFR, Estimated 3 (L) >60 mL/min    Comment: (NOTE) Calculated using the CKD-EPI Creatinine Equation (2021)    Anion gap 24 (H) 5 - 15    Comment: ELECTROLYTES REPEATED TO VERIFY Performed at [facility], [address] [place], [place] [address]   CBC     Status: Abnormal   Collection Time:  01/30/23  7:53 PM  Result Value Ref Range   WBC 6.6 4.0 - 10.5 K/uL   RBC 4.86 4.22 - 5.81 MIL/uL   Hemoglobin 11.8 (L) 13.0 - 17.0 g/dL   HCT 57.8 (L) 46.9 - 62.9 %   MCV 78.8 (L) 80.0 - 100.0 fL   MCH 24.3 (L) 26.0 - 34.0 pg   MCHC 30.8 30.0 - 36.0 g/dL   RDW 52.8 (H) 41.3 - 24.4 %   Platelets 265 150 - 400 K/uL   nRBC 0.0 0.0 - 0.2 %    Comment: Performed at [name], [address], [place], [place] [address]  CBG monitoring, ED     Status: Abnormal   Collection Time: 01/31/23 12:46 AM  Result Value Ref Range   Glucose-Capillary 104 (H) 70 - 99 mg/dL    Comment: Glucose reference range applies only to samples taken after fasting for at least 8 hours.   DG Chest 2 View  Result Date: 01/30/2023 CLINICAL DATA:  Exertional shortness of breath. EXAM: CHEST - 2 VIEW COMPARISON:  January 13, 2023 FINDINGS: There is stable moderate severity cardiac silhouette enlargement. Mild to moderate severity prominence of the central pulmonary vasculature is seen. Mild, chronic appearing increased lung markings are noted. Mild atelectasis is seen within the mid left lung. No pleural effusion or pneumothorax is identified. A chronic fracture deformity is seen involving the distal left clavicle. IMPRESSION: 1. Stable cardiomegaly with mild to moderate severity central pulmonary vascular congestion. Electronically Signed   By: Aram Candela M.D.   On: 01/30/2023 20:42    Pending Labs Unresulted Labs (From admission, onward)     Start     Ordered   01/31/23 0500  Comprehensive metabolic panel  Tomorrow morning,   R        01/31/23 0417   01/31/23 0500  CBC  Tomorrow morning,   R        01/31/23 0417   01/31/23 0500  Hemoglobin A1c  Tomorrow morning,   R        01/31/23 0428   01/31/23 0436  Brain natriuretic peptide  Add-on,   AD        01/31/23 0435   01/31/23 0431  Vitamin B12  (Anemia Panel (PNL))  Add-on,   AD        01/31/23 0430   01/31/23 0431  Folate  (Anemia Panel (PNL))   Add-on,   AD        01/31/23 0430   01/31/23 0431  Iron and TIBC  (Anemia Panel (PNL))  Add-on,   AD        01/31/23 0430   01/31/23 0431  Ferritin  (Anemia Panel (PNL))  Add-on,   AD        01/31/23  0430   01/31/23 0431  Reticulocytes  (Anemia Panel (PNL))  Add-on,   AD        01/31/23 0430   01/31/23 0426  Calcium, ionized  Add-on,   AD        01/31/23 0425   01/31/23 0419  PTH, intact and calcium  Add-on,   AD        01/31/23 0418   01/31/23 0419  VITAMIN D 25 Hydroxy (Vit-D Deficiency, Fractures)  Add-on,   AD        01/31/23 0418   01/31/23 0417  HIV Antibody (routine testing w rflx)  (HIV Antibody (Routine testing w reflex) panel)  Once,   R        01/31/23 0417   01/31/23 0217  Hepatitis B surface antigen  (New Admission Hemo Labs (Hepatitis B))  Add-on,   AD        01/31/23 0217   01/31/23 0217  Hepatitis B surface antibody,quantitative  (New Admission Hemo Labs (Hepatitis B))  Add-on,   AD        01/31/23 0217            Vitals/Pain Today's Vitals   01/31/23 0145 01/31/23 0200 01/31/23 0215 01/31/23 0313  BP: (!) 152/87 (!) 164/85  (!) 171/101  Pulse: 84 83 83 81  Resp: 14 18 13 18   Temp:    98 F (36.7 C)  TempSrc:      SpO2: 100% (!) 87% 100% 94%  Weight:      Height:      PainSc:    Asleep    Isolation Precautions No active isolations  Medications Medications  Chlorhexidine Gluconate Cloth 2 % PADS 6 each (has no administration in time range)  pentafluoroprop-tetrafluoroeth (GEBAUERS) aerosol 1 Application (has no administration in time range)  lidocaine (PF) (XYLOCAINE) 1 % injection 5 mL (has no administration in time range)  lidocaine-prilocaine (EMLA) cream 1 Application (has no administration in time range)  heparin injection 1,000 Units (has no administration in time range)  alteplase (CATHFLO ACTIVASE) injection 2 mg (has no administration in time range)  aspirin EC tablet 81 mg (has no administration in time range)  carvedilol (COREG) tablet  25 mg (has no administration in time range)  isosorbide mononitrate (IMDUR) 24 hr tablet 60 mg (has no administration in time range)  calcitRIOL (ROCALTROL) capsule 0.25 mcg (has no administration in time range)  insulin glargine-yfgn (SEMGLEE) injection 7 Units (has no administration in time range)  calcium acetate (PHOSLO) capsule 2,001 mg (has no administration in time range)  pantoprazole (PROTONIX) EC tablet 40 mg (has no administration in time range)  enoxaparin (LOVENOX) injection 30 mg (has no administration in time range)  sodium chloride flush (NS) 0.9 % injection 3 mL (has no administration in time range)  sodium chloride flush (NS) 0.9 % injection 3 mL (has no administration in time range)  sodium chloride flush (NS) 0.9 % injection 3 mL (has no administration in time range)  0.9 %  sodium chloride infusion (has no administration in time range)  acetaminophen (TYLENOL) tablet 650 mg (has no administration in time range)    Or  acetaminophen (TYLENOL) suppository 650 mg (has no administration in time range)  senna-docusate (Senokot-S) tablet 1 tablet (has no administration in time range)  ondansetron (ZOFRAN) tablet 4 mg (has no administration in time range)    Or  ondansetron (ZOFRAN) injection 4 mg (has no administration in time range)  insulin aspart (novoLOG) injection 0-6  Units (has no administration in time range)  insulin aspart (novoLOG) injection 0-5 Units (has no administration in time range)  hydrALAZINE (APRESOLINE) injection 10 mg (has no administration in time range)  ferrous sulfate tablet 325 mg (has no administration in time range)    Mobility walks     Focused Assessments Renal Assessment Handoff:  Hemodialysis Schedule: Hemodialysis Schedule: Monday/Wednesday/Friday Last Hemodialysis date and time: needs dialysis today    Restricted appendage: right arm   R Recommendations: See Admitting Provider Note  Report given to:   Additional Notes: Pt  ambulates to the bathroom

## 2023-01-31 NOTE — Progress Notes (Signed)
Received patient in bed to unit.  Alert and oriented.  Informed consent signed and in chart.   TX duration:2 hours  Patient tolerated well.  Transported back to the room  Alert, without acute distress.  Hand-off given to patient's nurse.   Access used: Right forearm graft Access issues: none  Total UF removed: Medication(s) given: none   01/31/23 1117  Vitals  Temp 97.7 F (36.5 C)  Temp Source Oral  BP (!) 199/116  MAP (mmHg) 137  BP Location Left Wrist  BP Method Automatic  Patient Position (if appropriate) Lying  Pulse Rate 88  ECG Heart Rate 87  Resp (!) 8  Level of Consciousness  Level of Consciousness Alert  MEWS COLOR  MEWS Score Color Green  Oxygen Therapy  SpO2 100 %  O2 Device Room Air  Pain Assessment  Pain Scale 0-10  Pain Score 0  PCA/Epidural/Spinal Assessment  Respiratory Pattern Regular;Unlabored  ECG Monitoring  Cardiac Rhythm NSR  MEWS Score  MEWS Temp 0  MEWS Systolic 0  MEWS Pulse 0  MEWS RR 1  MEWS LOC 0  MEWS Score 1     Stacie Glaze LPN [facility]

## 2023-01-31 NOTE — ED Notes (Signed)
Pt up and ambulatory with steady gait to restroom, then back to room, pt placed back on cardiac monitor.

## 2023-01-31 NOTE — Evaluation (Signed)
Pt has been withdrawn and refusing care since admitted to the floor. He ignores staff and will not answer questions while he plays on his phone. He will not follow commands and gets irritated at staff when persistent. He is alert and oriented and has capacity to follow commands and answer staff he just chooses not to. He has refused to take any meds form nurse, even after a later time was offered. Nurse spent at least 20 min in his room educating him on importance of blood pressure medications to help lower his elevated blood pressure and the risks involved with refusal pt states " my pressure goes up and down no matter what I do, I don't need to take anything" nurse educated him that the dr had placed orders for IV medication and that he didn't even have to take a pill we could administer it through his IV but pt still refused meds. Nurse asked him if he clearly understood teaching of medication and risk of refusal he stated "yes" Will continue to monitor.

## 2023-01-31 NOTE — Progress Notes (Addendum)
Date and time results received: 01/31/23 ,1057AM (use smartphrase ".now" to insert current time)  Test: Calcium Critical Value: 6.2  Name of Provider Notified: Dr. Marisue Humble  Orders Received? Or Actions Taken Informed Dr. Marisue Humble of critical lab of Calcium 6.2.  Dr. Marisue Humble said no changes.  Stacie Glaze LPN

## 2023-01-31 NOTE — Progress Notes (Signed)
PROGRESS NOTE    [name]  MVH:[id number] DOB: [birth year] DOA: 01/30/2023 PCP: [name]    Brief Narrative:   [name] is a 52 y.o. male with past medical history significant for ESRD on HD TTS, chronic systolic congestive heart failure (LVEF 20-25%), type 2 diabetes mellitus, HTN, generalized anxiety disorder, anemia of chronic medical/renal disease, GERD who presented to [facility] ED on 9/12 requesting for dialysis.  Patient reported did not attend any dialysis sessions in August 2024.  Patient reported his physician told him to come to the ED for dialysis and the dialysis unit would not take him back until he is evaluated by the emergency department.  Patient denies any shortness of breath, no chest pain.  Complaining about some mild abdominal distention.  Poor historian, very uncooperative with HPI.  Patient with history of medical noncompliance, 36 ED visits and 7 admissions over the last 6 months.  In the ED, temperature 97.7 F, HR 85, RR 18, BP 182/89, SpO2 99% on room air.  WBC 6.6, hemoglobin 11.8, platelet count 265.  Sodium 139, potassium 4.6, chloride 105, CO2 10, glucose 110, BUN 122, creatinine 18.04, calcium 6.2, anion gap 24.  Chest x-ray with stable cardiomegaly with mild to moderate severity central pulmonary vascular congestion.  EDP consulted nephrology who recommended serial dialysis over the next 1-3 days.  TRH consulted for admission for further evaluation and management of volume overload secondary to pulmonary edema in the setting of noncompliance with outpatient hemodialysis.  Assessment & Plan:   Acute on chronic systolic congestive heart failure with volume overload 2/2 to medical noncompliance  ESRD on HD TTS Anion gap metabolic acidosis Patient presenting to the ED with abdominal distention, overall ill feeling and directed by his physician given continued noncompliance of his outpatient HD.  Has apparently not gone to HD for the entire month of August  for unclear reasons.  Home HD facility would not allow him back to the facility unless he was sought evaluation and care in the emergency department.  On admission, BNP with BUN 122, calcium 6.2, anion gap 24, bicarb 10, potassium 4.6 with creatinine 18.  Chest x-ray with cardiomegaly with mild/moderate severity central pulmonary vascular congestion.  BNP elevated greater than 4500. -- Nephrology following, appreciate assistance -- HD today -- Strict I's and O's and daily weights -- Renal panel daily  Hypertensive urgency Hx essential hypertension -- Coreg 25 mg p.o. twice daily -- Isosorbide mononitrate 60 mg p.o. daily -- Hydralazine 10 mg IV every 6 hours as needed SBP greater than 180 or DBP greater than 110 -- Volume management with HD, likely significant contributing factor  Hypocalcemia secondary to ESRD -- Resuming HD today -- Continue calcitriol, PhosLo -- Renal panel daily  Type 2 diabetes mellitus Hemoglobin A1c --Semglee 70 units subcutaneous nightly -- SSI for coverage -- CBGs qAC/HS  Anemia of chronic medical disease Microcytic anemia Hemoglobin 11.9, MCV 78 on admission.  Anemia panel with iron 96, TIBC 287, ferritin 241, -- Ferrous sulfate 3 5 5  mg p.o. daily  GERD --Protonix 40 mg p.o. daily   DVT prophylaxis: enoxaparin (LOVENOX) injection 30 mg Start: 01/31/23 0800 SCDs Start: 01/31/23 0417    Code Status: Full Code Family Communication: No family present at bedside this morning  Disposition Plan:  Level of care: Telemetry Medical Status is: Inpatient Remains inpatient appropriate because: Needs serial HD sessions due to severity of volume overload    Consultants:  Nephrology  Procedures:  None  Antimicrobials:  None   Subjective: Patient seen examined bedside, resting calmly.  Currently in hemodialysis.  Patient requesting "blanket".  Will not engage in further discussions this morning.  Discussed with the ED RN, difficult obtaining labs  this morning and deferring to HD nurse to obtain.  No other acute concerns overnight per nursing staff.  Objective: Vitals:   01/31/23 1000 01/31/23 1030 01/31/23 1100 01/31/23 1117  BP: (!) 191/124 (!) 194/113 (!) 191/111 (!) 199/116  Pulse: 83 86 84 88  Resp: 12 (!) 9 12 (!) 8  Temp:    97.7 F (36.5 C)  TempSrc:    Oral  SpO2: 98% 100% 99% 100%  Weight:      Height:       No intake or output data in the 24 hours ending 01/31/23 1121 Filed Weights   01/31/23 0039 01/31/23 0835  Weight: 82.9 kg 84 kg    Examination:  Physical Exam: GEN: NAD, alert, chronically ill in appearance, appears older than stated age HEENT: NCAT, PERRL, EOMI, sclera clear, MMM PULM: CTAB w/o wheezes/crackles, normal respiratory effort, on room air CV: RRR w/o M/G/R GI: abd soft, + distention, NTTP, + BS MSK: no peripheral edema, moves all extremity independently NEURO: No focal neurological deficits PSYCH: Depressed mood, flat affect Integumentary: No concerning rashes/lesions/wounds on exposed skin surfaces    Data Reviewed: I have personally reviewed following labs and imaging studies  CBC: Recent Labs  Lab 01/30/23 1953 01/31/23 0430  WBC 6.6 6.8  HGB 11.8* 11.2*  HCT 38.3* 35.9*  MCV 78.8* 77.5*  PLT 265 245   Basic Metabolic Panel: Recent Labs  Lab 01/30/23 1953 01/31/23 0430  NA 139 139  K 4.6 4.6  CL 105 105  CO2 10* 12*  GLUCOSE 110* 89  BUN 122* 124*  CREATININE 18.04* 18.59*  CALCIUM 6.2* 6.2*   GFR: Estimated Creatinine Clearance: 5 mL/min (A) (by C-G formula based on SCr of 18.59 mg/dL (H)). Liver Function Tests: Recent Labs  Lab 01/31/23 0430  AST 17  ALT 15  ALKPHOS 170*  BILITOT 0.8  PROT 7.3  ALBUMIN 3.1*   No results for input(s): "LIPASE", "AMYLASE" in the last 168 hours. No results for input(s): "AMMONIA" in the last 168 hours. Coagulation Profile: No results for input(s): "INR", "PROTIME" in the last 168 hours. Cardiac Enzymes: No results  for input(s): "CKTOTAL", "CKMB", "CKMBINDEX", "TROPONINI" in the last 168 hours. BNP (last 3 results) No results for input(s): "PROBNP" in the last 8760 hours. HbA1C: No results for input(s): "HGBA1C" in the last 72 hours. CBG: Recent Labs  Lab 01/31/23 0046 01/31/23 0756  GLUCAP 104* 76   Lipid Profile: No results for input(s): "CHOL", "HDL", "LDLCALC", "TRIG", "CHOLHDL", "LDLDIRECT" in the last 72 hours. Thyroid Function Tests: No results for input(s): "TSH", "T4TOTAL", "FREET4", "T3FREE", "THYROIDAB" in the last 72 hours. Anemia Panel: Recent Labs    01/31/23 0430 01/31/23 0835  FOLATE  --  10.5  FERRITIN 241  --   TIBC 287  --   IRON 96  --   RETICCTPCT  --  1.2   Sepsis Labs: No results for input(s): "PROCALCITON", "LATICACIDVEN" in the last 168 hours.  No results found for this or any previous visit (from the past 240 hour(s)).       Radiology Studies: DG Chest 2 View  Result Date: 01/30/2023 CLINICAL DATA:  Exertional shortness of breath. EXAM: CHEST - 2 VIEW COMPARISON:  January 13, 2023 FINDINGS: There is stable moderate severity cardiac silhouette  enlargement. Mild to moderate severity prominence of the central pulmonary vasculature is seen. Mild, chronic appearing increased lung markings are noted. Mild atelectasis is seen within the mid left lung. No pleural effusion or pneumothorax is identified. A chronic fracture deformity is seen involving the distal left clavicle. IMPRESSION: 1. Stable cardiomegaly with mild to moderate severity central pulmonary vascular congestion. Electronically Signed   By: Aram Candela M.D.   On: 01/30/2023 20:42        Scheduled Meds:  aspirin EC  81 mg Oral Daily   [START ON 02/01/2023] calcitRIOL  0.25 mcg Oral Q T,Th,Sat-1800   calcium acetate  2,001 mg Oral TID WC   carvedilol  25 mg Oral BID WC   Chlorhexidine Gluconate Cloth  6 each Topical Q0600   enoxaparin (LOVENOX) injection  30 mg Subcutaneous Q24H   ferrous  sulfate  325 mg Oral Q breakfast   insulin aspart  0-5 Units Subcutaneous QHS   insulin aspart  0-6 Units Subcutaneous TID WC   insulin glargine-yfgn  7 Units Subcutaneous QHS   isosorbide mononitrate  60 mg Oral Daily   pantoprazole  40 mg Oral Daily   sodium chloride flush  3 mL Intravenous Q12H   sodium chloride flush  3 mL Intravenous Q12H   Continuous Infusions:  sodium chloride       LOS: 0 days    Time spent: 50 minutes spent on chart review, discussion with nursing staff, consultants, updating family and interview/physical exam; more than 50% of that time was spent in counseling and/or coordination of care.    Alvira Philips [place], DO [facility] Available via Epic secure chat 7am-7pm After these hours, please refer to coverage provider listed on [email or web address] 01/31/2023, 11:21 AM

## 2023-01-31 NOTE — Consult Note (Signed)
[name] Admit Date: 01/30/2023 01/31/2023 [name] Requesting Physician:  [place] MD  Reason for Consult:  ESRD, Uremia, HTN, Vol Overload  HPI:  53M ESRD THS at [facility], chronic nonadherence, chronic uncontrolled hypertension, chronic HFrEF, DM2, GERD who presented to the ED to reinitiate dialysis after last treatment on 01/18/2023.  He is homeless.  In the ED patient with potassium 4.6, BUN 122, hemoglobin 11.8, calcium 6.2.  Blood pressure is elevated similar to historical values.  Seen on HD.  His only complaint is swelling in the lower extremities he is eating breakfast.  He denies dyspnea or chest pain.  Outpt HD Orders Unit: [facility] Days: THS Time: 4h Dialyzer: F180 EDW: 61.5kg K/Ca: 2 / 2.5 Access: LUE AVG  Needle Size: 15g x2 BFR/DFR: 400 / 800 VDRA: C3 0.31mcg qTx EPO: None since 12/19/22 IV Fe: none Heparin: IVB heparin 2000 units qTx Treatment Adherence: poor    ROS Balance of 12 systems is negative w/ exceptions as above  PMH  Past Medical History:  Diagnosis Date   Anxiety 02/2019   Anxiety 10/2019   Diabetes mellitus type II, uncontrolled 07/17/2006   Elevated alkaline phosphatase level 01/2019   ESSENTIAL HYPERTENSION 07/17/2006   Homelessness 10/13/2021   HYPERLIPIDEMIA 11/24/2008   Insomnia 02/2019   Left bundle branch block 02/22/2019   Lesion of lip 10/2019   Pneumonia 10/22/2011   Renal disorder    Suicidal ideations 02/2019   Vitamin D deficiency 01/2019   PSH  Past Surgical History:  Procedure Laterality Date   ARM SURGERY     AV FISTULA PLACEMENT Right 02/20/2021   Procedure: RIGHT ARTERIOVENOUS GRAFT CREATION;  Surgeon: Chuck Hint, MD;  Location: [facility] OR;  Service: Vascular;  Laterality: Right;   INSERTION OF DIALYSIS CATHETER Left 02/20/2021   Procedure: INSERTION OF DIALYSIS CATHETER USING PALINDROME CATHETER;  Surgeon: Chuck Hint, MD;  Location: [facility] OR;  Service: Vascular;  Laterality: Left;   IR AV DIALY  SHUNT INTRO NEEDLE/INTRACATH INITIAL W/PTA/IMG RIGHT Right 11/06/2021   IR FLUORO GUIDE CV LINE RIGHT  09/12/2022   IR REMOVAL TUN CV CATH W/O FL  07/27/2021   IR THROMBECTOMY AV FISTULA W/THROMBOLYSIS INC/SHUNT/IMG RIGHT  09/11/2022   IR US GUIDE VASC ACCESS RIGHT  11/06/2021   IR US GUIDE VASC ACCESS RIGHT  09/11/2022   IR US GUIDE VASC ACCESS RIGHT  09/12/2022   TEE WITHOUT CARDIOVERSION N/A 10/25/2022   Procedure: TRANSESOPHAGEAL ECHOCARDIOGRAM;  Surgeon: Chilton Si, MD;  Location: [facility] INVASIVE CV LAB;  Service: Cardiovascular;  Laterality: N/A;   ULTRASOUND GUIDANCE FOR VASCULAR ACCESS  02/20/2021   Procedure: ULTRASOUND GUIDANCE FOR VASCULAR ACCESS;  Surgeon: Chuck Hint, MD;  Location: [facility] OR;  Service: Vascular;;   FH  Family History  Problem Relation Age of Onset   Hypertension Mother 9   Cancer Mother        bladder cancer   Alcohol abuse Father    Hypertension Brother    SH  reports that he has never smoked. He has never used smokeless tobacco. He reports that he does not drink alcohol and does not use drugs. Allergies  Allergies  Allergen Reactions   Sulfa Antibiotics Rash    Severe rash.  Severe rash.     Severe rash.   Other Other (See Comments)    Patient states he is allergic to the TB test - unknown reaction during childhood    Bactrim [Sulfamethoxazole-Trimethoprim] Rash   Cephalexin Other (See Comments) and  Rash    Patient with severe drug reaction including exfoliating skin rash and hypotension after being prescribed both cephalexin and sulfamethoxazole-trimethoprim simultaneously. Favor SMX as much more likely culprit as patient had tolerated other cephalosporins in the past, but cannot say with complete certainty that this was not related to cephalexin.  Other Reaction(s): Other, Other (See Comments)  Patient with severe drug reaction including exfoliating skin rash and hypotension after being prescribed both cephalexin and  sulfamethoxazole-trimethoprim simultaneously. Favor SMX as much more likely culprit as patient had tolerated other cephalosporins in the past, but cannot say with complete certainty that this was not related to cephalexin.   "Patient with severe drug reaction including exfoliating skin rash and hypotension after being prescribed both cephalexin and sulfamethoxazole-trimethoprim simultaneously. Favor SMX as much more likely culprit as patient had tolerated other cephalosporins in the past, but cannot say with complete certainty that this was not related to cephalexin." As noted in [facility]   "Patient with severe drug reaction including exfoliating skin rash and hypotension after being prescribed both cephalexin and sulfamethoxazole-trimethoprim simultaneously. Favor SMX as much more likely culprit as patient had tolerated other cephalosporins in the past, but cannot say with complete certainty that this was not related to cephalexin." As noted in [facility]   "Patient with severe drug reaction including exfoliating skin rash and hypotension after being prescribed both cephalexin and sulfamethoxazole-trimethoprim simultaneously. Favor SMX as much more likely culprit as patient had tolerated other cephalosporins in the past, but cannot say with complete certainty that this was not related to cephalexin." As noted in [facility]     Patient with severe drug reaction including exfoliating skin rash and hypotension after being prescribed both cephalexin and sulfamethoxazole-trimethoprim simultaneously. Favor SMX as much more likely culprit as patient had tolerated other cephalosporins in the past, but cannot say with complete certainty that this was not related to cephalexin.  "Patient with severe drug reaction including exfoliating skin rash and hypotension after being prescribed both cephalexin and sulfamethoxazole-trimethoprim simultaneously. Favor SMX as much more likely culprit as patient had tolerated  other cephalosporins in the past, but cannot say with complete certainty that this was not related to cephalexin." As noted in [facility]  "Patient with severe drug reaction including exfoliating skin rash and hypotension after being prescribed both cephalexin and sulfamethoxazole-trimethoprim simultaneously. Favor SMX as much more likely culprit as patient had tolerated other cephalosporins in the past, but cannot say with complete certainty that this was not related to cephalexin." As noted in [facility]     Patient with severe drug reaction inc... (TRUNCATED)   Home medications Prior to Admission medications   Medication Sig Start Date End Date Taking? Authorizing Provider  acetaminophen (TYLENOL) 500 MG tablet Take 1 tablet (500 mg total) by mouth every 8 (eight) hours as needed for mild pain. 05/28/22   Leroy Sea, MD  ASPIRIN LOW DOSE 81 MG tablet Take 1 tablet (81 mg total) by mouth daily. 05/28/22   Leroy Sea, MD  benzonatate (TESSALON) 100 MG capsule Take 1 capsule (100 mg total) by mouth every 8 (eight) hours. 09/09/22   Sabas Sous, MD  calcitRIOL (ROCALTROL) 0.25 MCG capsule Take 1 capsule (0.25 mcg total) by mouth every Tuesday, Thursday, and Saturday at 6 PM. 10/26/22   Ghimire, Werner Lean, MD  calcium acetate (PHOSLO) 667 MG capsule Take 3 capsules (2,001 mg total) by mouth 3 (three) times daily. 05/28/22   Leroy Sea, MD  calcium  acetate (PHOSLO) 667 MG tablet Take 3 tablets (2,001 mg total) by mouth 3 (three) times daily. 12/12/22     carvedilol (COREG) 25 MG tablet Take 1 tablet (25 mg total) by mouth 2 (two) times daily with a meal. 10/26/22   Ghimire, Werner Lean, MD  carvedilol (COREG) 25 MG tablet Take 1 tablet by mouth twice a day 12/12/22     doxycycline (VIBRAMYCIN) 100 MG capsule Take 1 capsule (100 mg total) by mouth 2 (two) times daily. 01/07/23   Mardella Layman, MD  glucose blood test strip Use to check fasting blood sugar once daily. 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 Insulin  dependent 11/29/20   Hollice Espy, MD  HUMALOG KWIKPEN 100 UNIT/ML KwikPen Inject into the skin. 10/26/22   [provider]  insulin aspart (NOVOLOG FLEXPEN) 100 UNIT/ML FlexPen 0-9 Units, Subcutaneous, 3 times daily with meals CBG < 70: Implement Hypoglycemia measures CBG 70 - 120: 0 units CBG 121 - 150: 1 unit CBG 151 - 200: 2 units CBG 201 - 250: 3 units CBG 251 - 300: 5 units CBG 301 - 350: 7 units CBG 351 - 400: 9 units CBG > 400: call MD 10/26/22   Maretta Bees, MD  insulin glargine-yfgn (SEMGLEE) 100 UNIT/ML injection Inject 0.07 mLs (7 Units total) into the skin at bedtime. 10/26/22   Ghimire, Werner Lean, MD  Insulin Pen Needle 32G X 4 MM MISC Use with insulin pens 05/28/22   Leroy Sea, MD  isosorbide mononitrate (IMDUR) 60 MG 24 hr tablet Take 1 tablet (60 mg total) by mouth daily. 10/26/22   Ghimire, Werner Lean, MD  isosorbide mononitrate (IMDUR) 60 MG 24 hr tablet Take 1 tablet (60 mg total) by mouth Nightly. 12/12/22     melatonin 3 MG TABS tablet Take 1 tablet (3 mg total) by mouth at bedtime as needed (insomnia). 10/26/22   Ghimire, Werner Lean, MD  mupirocin ointment (BACTROBAN) 2 % Apply 1 Application topically 3 (three) times daily. 01/07/23   Mardella Layman, MD  oxyCODONE-acetaminophen (PERCOCET/ROXICET) 5-325 MG tablet Take 1 tablet by mouth every 12 (twelve) hours as needed for severe pain. 10/26/22   Ghimire, Werner Lean, MD  PROTONIX 40 MG tablet Take 1 tablet (40 mg total) by mouth daily. 05/25/22   Leroy Sea, MD  simethicone (MYLICON) 80 MG chewable tablet Chew 1 tablet (80 mg total) by mouth 4 (four) times daily for 5 days. 11/22/22 12/17/22  Rodolph Bong, MD  gabapentin (NEURONTIN) 300 MG capsule Take 1 capsule (300 mg total) by mouth 3 (three) times daily. Patient not taking: Reported on 07/27/2019 07/29/18 08/30/19  Kallie Locks, FNP  sildenafil (VIAGRA) 25 MG tablet Take 1 tablet (25 mg total) by mouth as needed for erectile dysfunction. for erectile  dysfunction. Patient not taking: Reported on 08/11/2020 10/13/19 11/27/20  Kallie Locks, FNP    Current Medications Scheduled Meds:  aspirin EC  81 mg Oral Daily   [START ON 02/01/2023] calcitRIOL  0.25 mcg Oral Q T,Th,Sat-1800   calcium acetate  2,001 mg Oral TID WC   carvedilol  25 mg Oral BID WC   Chlorhexidine Gluconate Cloth  6 each Topical Q0600   enoxaparin (LOVENOX) injection  30 mg Subcutaneous Q24H   ferrous sulfate  325 mg Oral Q breakfast   insulin aspart  0-5 Units Subcutaneous QHS   insulin aspart  0-6 Units Subcutaneous TID WC   insulin glargine-yfgn  7 Units Subcutaneous QHS   isosorbide mononitrate  60 mg Oral Daily   pantoprazole  40 mg Oral Daily   sodium chloride flush  3 mL Intravenous Q12H   sodium chloride flush  3 mL Intravenous Q12H   Continuous Infusions:  sodium chloride     PRN Meds:.sodium chloride, acetaminophen **OR** acetaminophen, alteplase, heparin, hydrALAZINE, lidocaine (PF), lidocaine-prilocaine, ondansetron **OR** ondansetron (ZOFRAN) IV, pentafluoroprop-tetrafluoroeth, senna-docusate, sodium chloride flush  CBC Recent Labs  Lab 01/30/23 1953  WBC 6.6  HGB 11.8*  HCT 38.3*  MCV 78.8*  PLT 265   Basic Metabolic Panel Recent Labs  Lab 01/30/23 1953  NA 139  K 4.6  CL 105  CO2 10*  GLUCOSE 110*  BUN 122*  CREATININE 18.04*  CALCIUM 6.2*    Physical Exam  Blood pressure (!) 174/94, pulse 75, temperature 97.6 F (36.4 C), temperature source Oral, resp. rate 14, height 5\' 7"  (1.702 m), weight 84 kg, SpO2 100%. GEN: NAD but chronically ill-appearing ENT: NCAT EYES: EOMI CV: Regular, no rub PULM: Clear bilaterally, normal work of breathing ABD: Soft, nontender, nondistended SKIN: Right upper arm AV graft cannulated EXT: 2+ lower extremity edema at the current time   Assessment 89M ESRD with chronic nonadherence last HD 8/31, homelessness, hypertension, chronic HFrEF.  ESRD with nonadherence and uremia: Resuming HD with  gradual up titration of dose.  Next HD tomorrow 9/14 on schedule.  And likely can be discharged to outpatient dialysis next week Hypertension: Follow-up with [facility] and restarting home medication Anemia: Relatively stable, trend BMD with hypocalcemia, resume VDRA.  Continue calcium acetate as binder. check albumin and phosphorus Homelessness: Case management/social work to help HFrEF: Largely as above  Plan HD now, inc goal to 2L [facility] HD tomorrow: 300/500, 3h, 3L [facility] Daily weights, Daily Renal Panel, Strict I/Os, Avoid nephrotoxins (NSAIDs, judicious IV Contrast)    [name]  01/31/2023, 9:29 AM

## 2023-01-31 NOTE — ED Notes (Signed)
Assumed care of patient, NAD noted at this time, pt resting in bed, respirations even and unlabored, skin warm and dry, bed in low position, call light within reach. Comfort measures offered. Will continue to monitor. Pt denies any needs. Waiting for IP bed placement at this time.

## 2023-01-31 NOTE — ED Notes (Signed)
Rounded on patient, patients BP cuff was off, asked patient to lift arm so that BP cuff could be put back on patient for continued monitoring. Patient refused to lift arm, RN had to lift arm and hold arm up to put BP cuff back on patient. Also placed patient on 2L [place].

## 2023-01-31 NOTE — ED Notes (Signed)
Patient refusing to comply with staff simple requests or to answer question. When asked patient why he has missed dialysis patient stated "I couldn't get up that early" Per patietn he has not been to dialysis since Aug.

## 2023-02-01 DIAGNOSIS — Z5329 Procedure and treatment not carried out because of patient's decision for other reasons: Secondary | ICD-10-CM

## 2023-02-01 DIAGNOSIS — E8770 Fluid overload, unspecified: Secondary | ICD-10-CM | POA: Diagnosis not present

## 2023-02-01 DIAGNOSIS — Z91199 Patient's noncompliance with other medical treatment and regimen due to unspecified reason: Secondary | ICD-10-CM | POA: Diagnosis not present

## 2023-02-01 LAB — GLUCOSE, CAPILLARY: Glucose-Capillary: 124 mg/dL — ABNORMAL HIGH (ref 70–99)

## 2023-02-01 MED ORDER — HEPARIN SODIUM (PORCINE) 1000 UNIT/ML DIALYSIS: 20.0000 [IU]/kg | INTRAMUSCULAR | Status: DC | PRN

## 2023-02-01 NOTE — Progress Notes (Signed)
Pt called nurse into room and told her that he wanted to leave AMA. Nurse educated pt that palliative care would be seeing him and they could offer him some good services and options. He stated " I don't care, they are taking too long and I have to go now. Take this out of my arm, I don't care what they have to say" nurse made MD aware of pt decision and removed IV. He walked himself off unit with belongings.

## 2023-02-01 NOTE — Progress Notes (Signed)
Admit: 01/30/2023 LOS: 1  83M ESRD with chronic nonadherence last HD 8/31, homelessness, hypertension, chronic HFrEF.   Subjective:  HD yesterday with 2 L removed, receiving gradual treatments because of long gap from last dialysis No complaints this morning  He has been refusing vital signs and other care.  09/13 0701 - 09/14 0700 In: -  Out: 2000   Filed Weights   01/31/23 0835 01/31/23 1225 02/01/23 0545  Weight: 84 kg 80.6 kg 79.7 kg    Scheduled Meds:  aspirin EC  81 mg Oral Daily   calcitRIOL  0.25 mcg Oral Q T,Th,Sat-1800   calcium acetate  2,001 mg Oral TID WC   carvedilol  25 mg Oral BID WC   Chlorhexidine Gluconate Cloth  6 each Topical Q0600   enoxaparin (LOVENOX) injection  30 mg Subcutaneous Q24H   ferrous sulfate  325 mg Oral Q breakfast   insulin aspart  0-5 Units Subcutaneous QHS   insulin aspart  0-6 Units Subcutaneous TID WC   insulin glargine-yfgn  7 Units Subcutaneous QHS   isosorbide mononitrate  60 mg Oral Daily   pantoprazole  40 mg Oral Daily   sodium chloride flush  3 mL Intravenous Q12H   sodium chloride flush  3 mL Intravenous Q12H   Continuous Infusions:  sodium chloride     PRN Meds:.sodium chloride, acetaminophen **OR** acetaminophen, hydrALAZINE, influenza vac split trivalent PF, ondansetron **OR** ondansetron (ZOFRAN) IV, senna-docusate, sodium chloride flush  Current Labs: reviewed    Physical Exam:  Blood pressure (!) 154/86, pulse 84, temperature 99.2 F (37.3 C), temperature source Oral, resp. rate 15, height 5\' 7"  (1.702 m), weight 79.7 kg, SpO2 94%. GEN: NAD but chronically ill-appearing ENT: NCAT EYES: EOMI CV: Regular, no rub PULM: Clear bilaterally, normal work of breathing ABD: Soft, nontender, nondistended SKIN: Right upper arm AV graft cannulated EXT: 2+ lower extremity edema at the current time  Outpt HD Orders Unit: [facility] Days: [facility] Time: 4h Dialyzer: F180 EDW: 61.5kg K/Ca: 2 / 2.5 Access: LUE AVG  Needle Size:  15g x2 BFR/DFR: 400 / 800 VDRA: C3 0.54mcg qTx EPO: None since 12/19/22 IV Fe: none Heparin: IVB heparin 2000 units qTx Treatment Adherence: poor  A ESRD with nonadherence and uremia: Resuming HD with gradual up titration of dose.  Next HD today back on schedule.  And likely can be discharged to outpatient dialysis next week Hypertension: Follow-up with UF and restarting home medication Anemia: Relatively stable, trend BMD with hypocalcemia and hyperphosphatemia connected to dietary and medication noncompliance, resume VDRA.  Continue calcium acetate as binder.  Homelessness: Case management/social work to help HFrEF: Largely as above  P Dialysis today, serial, with gradual up titration of dialysis dose.  Goal UF 3 L Tentative next treatment on 9/17 on regular schedule. Medication Issues; Preferred narcotic agents for pain control are hydromorphone, fentanyl, and methadone. Morphine should not be used.  Baclofen should be avoided Avoid oral sodium phosphate and magnesium citrate based laxatives / bowel preps    Sabra Heck MD 02/01/2023, 9:48 AM  Recent Labs  Lab 01/30/23 1953 01/31/23 0430 01/31/23 1604  NA 139 139  --   K 4.6 4.6  --   CL 105 105  --   CO2 10* 12*  --   GLUCOSE 110* 89  --   BUN 122* 124*  --   CREATININE 18.04* 18.59*  --   CALCIUM 6.2* 6.2*  --   PHOS  --   --  8.1*  Recent Labs  Lab 01/30/23 1953 01/31/23 0430  WBC 6.6 6.8  HGB 11.8* 11.2*  HCT 38.3* 35.9*  MCV 78.8* 77.5*  PLT 265 245

## 2023-02-01 NOTE — Progress Notes (Signed)
Pt is refusing morning vital sign and finger stick blood sugar checks.

## 2023-02-01 NOTE — Progress Notes (Signed)
PROGRESS NOTE    [name]  ZOX:[id number] DOB: 1970/09/26 DOA: 01/30/2023 PCP: [name]    Brief Narrative:   [name] is a 52 y.o. male with past medical history significant for ESRD on HD TTS, chronic systolic congestive heart failure (LVEF 20-25%), type 2 diabetes mellitus, HTN, generalized anxiety disorder, anemia of chronic medical/renal disease, GERD who presented to [facility] ED on 9/12 requesting for dialysis.  Patient reported did not attend any dialysis sessions in August 2024.  Patient reported his physician told him to come to the ED for dialysis and the dialysis unit would not take him back until he is evaluated by the emergency department.  Patient denies any shortness of breath, no chest pain.  Complaining about some mild abdominal distention.  Poor historian, very uncooperative with HPI.  Patient with history of medical noncompliance, 36 ED visits and 7 admissions over the last 6 months.  In the ED, temperature 97.7 F, HR 85, RR 18, BP 182/89, SpO2 99% on room air.  WBC 6.6, hemoglobin 11.8, platelet count 265.  Sodium 139, potassium 4.6, chloride 105, CO2 10, glucose 110, BUN 122, creatinine 18.04, calcium 6.2, anion gap 24.  Chest x-ray with stable cardiomegaly with mild to moderate severity central pulmonary vascular congestion.  EDP consulted nephrology who recommended serial dialysis over the next 1-3 days.  TRH consulted for admission for further evaluation and management of volume overload secondary to pulmonary edema in the setting of noncompliance with outpatient hemodialysis.  Assessment & Plan:   Acute on chronic systolic congestive heart failure with volume overload 2/2 to medical noncompliance  ESRD on HD TTS Anion gap metabolic acidosis Patient presenting to the ED with abdominal distention, overall ill feeling and directed by his physician given continued noncompliance of his outpatient HD.  Has apparently not gone to HD for the entire month of August  for unclear reasons.  Home HD facility would not allow him back to the facility unless he was sought evaluation and care in the emergency department.  On admission, BNP with BUN 122, calcium 6.2, anion gap 24, bicarb 10, potassium 4.6 with creatinine 18.  Chest x-ray with cardiomegaly with mild/moderate severity central pulmonary vascular congestion.  BNP elevated greater than 4500. -- Nephrology following, appreciate assistance -- HD planned once again today -- Strict I's and O's and daily weights -- Renal panel daily  Hypertensive urgency Hx essential hypertension -- Coreg 25 mg p.o. twice daily -- Isosorbide mononitrate 60 mg p.o. daily -- Hydralazine 10 mg IV every 6 hours as needed SBP greater than 180 or DBP greater than 110 -- Volume management with HD, likely significant contributing factor  Hypocalcemia secondary to ESRD -- Resuming HD today -- Continue calcitriol, PhosLo -- Renal panel daily  Type 2 diabetes mellitus Hemoglobin A1c 7.3 -- Semglee 7 units subcutaneous nightly -- SSI for coverage -- CBGs qAC/HS  Anemia of chronic medical disease Microcytic anemia Hemoglobin 11.9, MCV 78 on admission.  Anemia panel with iron 96, TIBC 287, ferritin 241, -- Ferrous sulfate 3 5 5  mg p.o. daily  GERD --Protonix 40 mg p.o. daily   DVT prophylaxis: enoxaparin (LOVENOX) injection 30 mg Start: 01/31/23 0800 SCDs Start: 01/31/23 0417    Code Status: Full Code Family Communication: No family present at bedside this morning  Disposition Plan:  Level of care: Telemetry Medical Status is: Inpatient Remains inpatient appropriate because: Needs serial HD sessions due to severity of volume overload    Consultants:  Nephrology  Procedures:  None  Antimicrobials:  None   Subjective: Patient seen examined bedside, lying in bed.  Asking for menu to order breakfast.  Complaining of shortness of breath, replaced on oxygen this morning.  Upset about carbohydrate restriction on  his diet, discussed will remove given that his diabetes is fairly well-controlled with hemoglobin A1c 7.3.  Patient states nephrology plans further HD today.  No other questions or concerns at this time.  Denies headache, no chest pain, no abdominal pain, no fever, no focal weakness.  No new concerns overnight per nursing staff or other concerns this morning per daytime RN.   Objective: Vitals:   01/31/23 2056 01/31/23 2255 01/31/23 2258 02/01/23 0545  BP: (!) 154/86     Pulse: 84     Resp: 15     Temp: 99.2 F (37.3 C)     TempSrc: Oral     SpO2: 92% (!) 87% 94%   Weight:    79.7 kg  Height:        Intake/Output Summary (Last 24 hours) at 02/01/2023 1016 Last data filed at 01/31/2023 1127 Gross per 24 hour  Intake --  Output 2000 ml  Net -2000 ml   Filed Weights   01/31/23 0835 01/31/23 1225 02/01/23 0545  Weight: 84 kg 80.6 kg 79.7 kg    Examination:  Physical Exam: GEN: NAD, alert, chronically ill in appearance, appears older than stated age HEENT: NCAT, PERRL, EOMI, sclera clear, MMM PULM: Breath sounds slightly diminished bilateral bases with crackles, no wheezing, normal respiratory effort, on 2 L nasal cannula CV: RRR w/o M/G/R GI: abd soft, + distention, NTTP, + BS MSK: no peripheral edema, moves all extremity independently NEURO: No focal neurological deficits PSYCH: Depressed mood, flat affect Integumentary: No concerning rashes/lesions/wounds on exposed skin surfaces    Data Reviewed: I have personally reviewed following labs and imaging studies  CBC: Recent Labs  Lab 01/30/23 1953 01/31/23 0430  WBC 6.6 6.8  HGB 11.8* 11.2*  HCT 38.3* 35.9*  MCV 78.8* 77.5*  PLT 265 245   Basic Metabolic Panel: Recent Labs  Lab 01/30/23 1953 01/31/23 0430 01/31/23 1604  NA 139 139  --   K 4.6 4.6  --   CL 105 105  --   CO2 10* 12*  --   GLUCOSE 110* 89  --   BUN 122* 124*  --   CREATININE 18.04* 18.59*  --   CALCIUM 6.2* 6.2*  --   PHOS  --   --  8.1*    GFR: Estimated Creatinine Clearance: 4.9 mL/min (A) (by C-G formula based on SCr of 18.59 mg/dL (H)). Liver Function Tests: Recent Labs  Lab 01/31/23 0430 01/31/23 1604  AST 17  --   ALT 15  --   ALKPHOS 170*  --   BILITOT 0.8  --   PROT 7.3  --   ALBUMIN 3.1* 3.2*   No results for input(s): "LIPASE", "AMYLASE" in the last 168 hours. No results for input(s): "AMMONIA" in the last 168 hours. Coagulation Profile: No results for input(s): "INR", "PROTIME" in the last 168 hours. Cardiac Enzymes: No results for input(s): "CKTOTAL", "CKMB", "CKMBINDEX", "TROPONINI" in the last 168 hours. BNP (last 3 results) No results for input(s): "PROBNP" in the last 8760 hours. HbA1C: Recent Labs    01/31/23 0500  HGBA1C 7.3*   CBG: Recent Labs  Lab 01/31/23 0046 01/31/23 0756 01/31/23 1312 01/31/23 1716 01/31/23 2200  GLUCAP 104* 76 79 130* 137*   Lipid  Profile: No results for input(s): "CHOL", "HDL", "LDLCALC", "TRIG", "CHOLHDL", "LDLDIRECT" in the last 72 hours. Thyroid Function Tests: No results for input(s): "TSH", "T4TOTAL", "FREET4", "T3FREE", "THYROIDAB" in the last 72 hours. Anemia Panel: Recent Labs    01/31/23 0430 01/31/23 0500 01/31/23 0835  VITAMINB12  --  461  --   FOLATE  --   --  10.5  FERRITIN 241  --   --   TIBC 287  --   --   IRON 96  --   --   RETICCTPCT  --   --  1.2   Sepsis Labs: No results for input(s): "PROCALCITON", "LATICACIDVEN" in the last 168 hours.  No results found for this or any previous visit (from the past 240 hour(s)).       Radiology Studies: DG Chest 2 View  Result Date: 01/30/2023 CLINICAL DATA:  Exertional shortness of breath. EXAM: CHEST - 2 VIEW COMPARISON:  January 13, 2023 FINDINGS: There is stable moderate severity cardiac silhouette enlargement. Mild to moderate severity prominence of the central pulmonary vasculature is seen. Mild, chronic appearing increased lung markings are noted. Mild atelectasis is seen within  the mid left lung. No pleural effusion or pneumothorax is identified. A chronic fracture deformity is seen involving the distal left clavicle. IMPRESSION: 1. Stable cardiomegaly with mild to moderate severity central pulmonary vascular congestion. Electronically Signed   By: Aram Candela M.D.   On: 01/30/2023 20:42        Scheduled Meds:  aspirin EC  81 mg Oral Daily   calcitRIOL  0.25 mcg Oral Q T,Th,Sat-1800   calcium acetate  2,001 mg Oral TID WC   carvedilol  25 mg Oral BID WC   Chlorhexidine Gluconate Cloth  6 each Topical Q0600   enoxaparin (LOVENOX) injection  30 mg Subcutaneous Q24H   ferrous sulfate  325 mg Oral Q breakfast   insulin aspart  0-5 Units Subcutaneous QHS   insulin aspart  0-6 Units Subcutaneous TID WC   insulin glargine-yfgn  7 Units Subcutaneous QHS   isosorbide mononitrate  60 mg Oral Daily   pantoprazole  40 mg Oral Daily   sodium chloride flush  3 mL Intravenous Q12H   sodium chloride flush  3 mL Intravenous Q12H   Continuous Infusions:  sodium chloride       LOS: 1 day    Time spent: 50 minutes spent on chart review, discussion with nursing staff, consultants, updating family and interview/physical exam; more than 50% of that time was spent in counseling and/or coordination of care.    Alvira Philips [place], DO [facility] Available via Epic secure chat 7am-7pm After these hours, please refer to coverage provider listed on [email or web address] 02/01/2023, 10:16 AM

## 2023-02-01 NOTE — Plan of Care (Signed)
  Problem: Coping: Goal: Ability to adjust to condition or change in health will improve Outcome: Progressing   

## 2023-02-01 NOTE — Progress Notes (Signed)
Patient refused VS.    [name]

## 2023-02-01 NOTE — Discharge Summary (Signed)
[facility] Physician Discharge Summary  [name] ZOX:[id number] DOB: Jan 24, 1971 DOA: 01/30/2023  PCP: [name], Clinic  Admit date: 01/30/2023 Discharge date: 02/01/2023  Admitted From: Home Disposition: Left AGAINST MEDICAL ADVICE  Recommendations for Outpatient Follow-up:  Patient with 41 ED presentations and 7 hospitalizations over the last 6 months.  Patient continues to disregard medical treatment plans including not adherence to hemodialysis.  Given his noncompliance/nonadherence to therapy while inpatient or outpatient, if he further presents to the ED; ultimately no indication for admission unless patient desires to adhere to medical care.   History of present illness:  [name] is a 52 y.o. male with past medical history significant for ESRD on HD TTS, chronic systolic congestive heart failure (LVEF 20-25%), type 2 diabetes mellitus, HTN, generalized anxiety disorder, anemia of chronic medical/renal disease, GERD who presented to [facility] ED on 9/12 requesting for dialysis.  Patient reported did not attend any dialysis sessions in August 2024.  Patient reported his physician told him to come to the ED for dialysis and the dialysis unit would not take him back until he is evaluated by the emergency department.  Patient denies any shortness of breath, no chest pain.  Complaining about some mild abdominal distention.  Poor historian, very uncooperative with HPI.  Patient with history of medical noncompliance, 36 ED visits and 7 admissions over the last 6 months.   In the ED, temperature 97.7 F, HR 85, RR 18, BP 182/89, SpO2 99% on room air.  WBC 6.6, hemoglobin 11.8, platelet count 265.  Sodium 139, potassium 4.6, chloride 105, CO2 10, glucose 110, BUN 122, creatinine 18.04, calcium 6.2, anion gap 24.  Chest x-ray with stable cardiomegaly with mild to moderate severity central pulmonary vascular congestion.  EDP consulted nephrology who recommended serial dialysis over the next 1-3 days.  TRH  consulted for admission for further evaluation and management of volume overload secondary to pulmonary edema in the setting of noncompliance with outpatient hemodialysis.  Hospital course:  Acute on chronic systolic congestive heart failure with volume overload 2/2 to medical noncompliance  ESRD on HD TTS Anion gap metabolic acidosis Patient presenting to the ED with abdominal distention, overall ill feeling and directed by his physician given continued noncompliance of his outpatient HD.  Has apparently not gone to HD for the entire month of August for unclear reasons.  Home HD facility would not allow him back to the facility unless he was sought evaluation and care in the emergency department.  On admission, BNP with BUN 122, calcium 6.2, anion gap 24, bicarb 10, potassium 4.6 with creatinine 18.  Chest x-ray with cardiomegaly with mild/moderate severity central pulmonary vascular congestion.  BNP elevated greater than 4500.  Nephrology was consulted and patient underwent hemodialysis.  Patient refused further care including hemodialysis and palliative care was consulted.  Patient decided to leave AGAINST MEDICAL ADVICE.   Hypertensive urgency Hx essential hypertension Continue Coreg 25 mg p.o. twice daily and Isosorbide mononitrate 60 mg p.o. daily   Hypocalcemia secondary to ESRD Continue with HD.   Type 2 diabetes mellitus Hemoglobin A1c 7.3, continue home insulin regimen.   Anemia of chronic medical disease Microcytic anemia Hemoglobin 11.9, MCV 78 on admission.  Anemia panel with iron 96, TIBC 287, ferritin 241. Ferrous sulfate 3 5 5  mg p.o. daily   GERD PPI.  Discharge Diagnoses:  Principal Problem:   Volume overload Active Problems:   Noncompliance with renal dialysis   ESRD on dialysis (HCC) noncompliant with dialysis  Hypocalcemia   Chronic systolic CHF reduced EF 20 to 16% (congestive heart failure) (HCC)   Insulin dependent type 2 diabetes mellitus (HCC)   Essential  hypertension   Hypertensive urgency   GAD (generalized anxiety disorder)   GERD (gastroesophageal reflux disease)   Anemia of chronic disease    Discharge Instructions     Allergies  Allergen Reactions   Sulfa Antibiotics Rash    Severe rash.  Severe rash.     Severe rash.   Other Other (See Comments)    Patient states he is allergic to the TB test - unknown reaction during childhood    Bactrim [Sulfamethoxazole-Trimethoprim] Rash   Cephalexin Other (See Comments) and Rash    Patient with severe drug reaction including exfoliating skin rash and hypotension after being prescribed both cephalexin and sulfamethoxazole-trimethoprim simultaneously. Favor SMX as much more likely culprit as patient had tolerated other cephalosporins in the past, but cannot say with complete certainty that this was not related to cephalexin.  Other Reaction(s): Other, Other (See Comments)  Patient with severe drug reaction including exfoliating skin rash and hypotension after being prescribed both cephalexin and sulfamethoxazole-trimethoprim simultaneously. Favor SMX as much more likely culprit as patient had tolerated other cephalosporins in the past, but cannot say with complete certainty that this was not related to cephalexin.   "Patient with severe drug reaction including exfoliating skin rash and hypotension after being prescribed both cephalexin and sulfamethoxazole-trimethoprim simultaneously. Favor SMX as much more likely culprit as patient had tolerated other cephalosporins in the past, but cannot say with complete certainty that this was not related to cephalexin." As noted in [facility]   "Patient with severe drug reaction including exfoliating skin rash and hypotension after being prescribed both cephalexin and sulfamethoxazole-trimethoprim simultaneously. Favor SMX as much more likely culprit as patient had tolerated other cephalosporins in the past, but cannot say with complete certainty that  this was not related to cephalexin." As noted in [facility]   "Patient with severe drug reaction including exfoliating skin rash and hypotension after being prescribed both cephalexin and sulfamethoxazole-trimethoprim simultaneously. Favor SMX as much more likely culprit as patient had tolerated other cephalosporins in the past, but cannot say with complete certainty that this was not related to cephalexin." As noted in [facility]     Patient with severe drug reaction including exfoliating skin rash and hypotension after being prescribed both cephalexin and sulfamethoxazole-trimethoprim simultaneously. Favor SMX as much more likely culprit as patient had tolerated other cephalosporins in the past, but cannot say with complete certainty that this was not related to cephalexin.  "Patient with severe drug reaction including exfoliating skin rash and hypotension after being prescribed both cephalexin and sulfamethoxazole-trimethoprim simultaneously. Favor SMX as much more likely culprit as patient had tolerated other cephalosporins in the past, but cannot say with complete certainty that this was not related to cephalexin." As noted in [facility]  "Patient with severe drug reaction including exfoliating skin rash and hypotension after being prescribed both cephalexin and sulfamethoxazole-trimethoprim simultaneously. Favor SMX as much more likely culprit as patient had tolerated other cephalosporins in the past, but cannot say with complete certainty that this was not related to cephalexin." As noted in [facility]     Patient with severe drug reaction inc... (TRUNCATED)    Consultations: Nephrology   Procedures/Studies: DG Chest 2 View  Result Date: 01/30/2023 CLINICAL DATA:  Exertional shortness of breath. EXAM: CHEST - 2 VIEW COMPARISON:  January 13, 2023 FINDINGS: There is  stable moderate severity cardiac silhouette enlargement. Mild to moderate severity prominence of the central pulmonary  vasculature is seen. Mild, chronic appearing increased lung markings are noted. Mild atelectasis is seen within the mid left lung. No pleural effusion or pneumothorax is identified. A chronic fracture deformity is seen involving the distal left clavicle. IMPRESSION: 1. Stable cardiomegaly with mild to moderate severity central pulmonary vascular congestion. Electronically Signed   By: Aram Candela M.D.   On: 01/30/2023 20:42   DG Chest 2 View  Result Date: 01/04/2023 CLINICAL DATA:  Shortness of breath. EXAM: CHEST - 2 VIEW COMPARISON:  December 20, 2022 FINDINGS: The cardiac silhouette is markedly enlarged and unchanged in size. There is mild prominence of the perihilar pulmonary vasculature. Mild linear atelectasis is seen within the mid left lung. There is no evidence of acute infiltrate, pleural effusion or pneumothorax. Multiple chronic right-sided rib fractures are seen. IMPRESSION: Stable cardiomegaly with mild pulmonary vascular congestion. Electronically Signed   By: Aram Candela M.D.   On: 01/04/2023 17:18     Subjective: Patient leaving AMA  Discharge Exam: Vitals:   01/31/23 2255 01/31/23 2258  BP:    Pulse:    Resp:    SpO2: (!) 87% 94%   Vitals:   01/31/23 2056 01/31/23 2255 01/31/23 2258 02/01/23 0545  BP: (!) 154/86     Pulse: 84     Resp: 15     Temp: 99.2 F (37.3 C)     TempSrc: Oral     SpO2: 92% (!) 87% 94%   Weight:    79.7 kg  Height:           The results of significant diagnostics from this hospitalization (including imaging, microbiology, ancillary and laboratory) are listed below for reference.     Microbiology: No results found for this or any previous visit (from the past 240 hour(s)).   Labs: BNP (last 3 results) Recent Labs    12/31/22 0121 01/04/23 1628 01/31/23 0835  BNP >4,500.0* >4,500.0* >4,500.0*   Basic Metabolic Panel: Recent Labs  Lab 01/30/23 1953 01/31/23 0430 01/31/23 1604  NA 139 139  --   K 4.6 4.6  --   CL  105 105  --   CO2 10* 12*  --   GLUCOSE 110* 89  --   BUN 122* 124*  --   CREATININE 18.04* 18.59*  --   CALCIUM 6.2* 6.2*  --   PHOS  --   --  8.1*   Liver Function Tests: Recent Labs  Lab 01/31/23 0430 01/31/23 1604  AST 17  --   ALT 15  --   ALKPHOS 170*  --   BILITOT 0.8  --   PROT 7.3  --   ALBUMIN 3.1* 3.2*   No results for input(s): "LIPASE", "AMYLASE" in the last 168 hours. No results for input(s): "AMMONIA" in the last 168 hours. CBC: Recent Labs  Lab 01/30/23 1953 01/31/23 0430  WBC 6.6 6.8  HGB 11.8* 11.2*  HCT 38.3* 35.9*  MCV 78.8* 77.5*  PLT 265 245   Cardiac Enzymes: No results for input(s): "CKTOTAL", "CKMB", "CKMBINDEX", "TROPONINI" in the last 168 hours. BNP: Invalid input(s): "POCBNP" CBG: Recent Labs  Lab 01/31/23 0756 01/31/23 1312 01/31/23 1716 01/31/23 2200 02/01/23 1652  GLUCAP 76 79 130* 137* 124*   D-Dimer No results for input(s): "DDIMER" in the last 72 hours. Hgb A1c Recent Labs    01/31/23 0500  HGBA1C 7.3*   Lipid Profile No results  for input(s): "CHOL", "HDL", "LDLCALC", "TRIG", "CHOLHDL", "LDLDIRECT" in the last 72 hours. Thyroid function studies No results for input(s): "TSH", "T4TOTAL", "T3FREE", "THYROIDAB" in the last 72 hours.  Invalid input(s): "FREET3" Anemia work up Recent Labs    01/31/23 0430 01/31/23 0500 01/31/23 0835  VITAMINB12  --  461  --   FOLATE  --   --  10.5  FERRITIN 241  --   --   TIBC 287  --   --   IRON 96  --   --   RETICCTPCT  --   --  1.2   Urinalysis    Component Value Date/Time   COLORURINE YELLOW 02/19/2021 0045   APPEARANCEUR HAZY (A) 02/19/2021 0045   APPEARANCEUR Clear 05/09/2020 1509   LABSPEC 1.013 02/19/2021 0045   PHURINE 5.0 02/19/2021 0045   GLUCOSEU >=500 (A) 02/19/2021 0045   HGBUR MODERATE (A) 02/19/2021 0045   BILIRUBINUR NEGATIVE 02/19/2021 0045   BILIRUBINUR Negative 05/09/2020 1509   KETONESUR NEGATIVE 02/19/2021 0045   PROTEINUR >=300 (A) 02/19/2021  0045   UROBILINOGEN 0.2 02/04/2020 1612   UROBILINOGEN 0.2 02/16/2014 0045   NITRITE NEGATIVE 02/19/2021 0045   LEUKOCYTESUR NEGATIVE 02/19/2021 0045   Sepsis Labs Recent Labs  Lab 01/30/23 1953 01/31/23 0430  WBC 6.6 6.8   Microbiology No results found for this or any previous visit (from the past 240 hour(s)).   Time coordinating discharge: Over 30 minutes  SIGNED:   Alvira Philips [place], DO  [facility] 02/01/2023, 5:56 PM

## 2023-02-01 NOTE — Progress Notes (Signed)
Pt refused dialysis when transport arrived Ozzie Hoyle, [place] aware

## 2023-02-01 NOTE — Progress Notes (Signed)
Patient refused VS check and CHG bath. Patient was educated on the importance of each and their indications. Nurse asked patient multiple times if he understands the teaching but this nurse did not receive any reply. Patient continues to ignore staffs.  [name]

## 2023-02-02 NOTE — Discharge Planning (Signed)
[facility] Patient Discharge Orders - [facility] CLINIC: [facility]  Patient's name: [name] Admit/DC Dates: 01/30/2023 - 02/01/2023 **LEFT AMA**  DISCHARGE DIAGNOSES: Uremia, hypocalcemia, metabolic acidosis - non-adherence to outpatient HD  HTN  HD ORDER CHANGES: Heparin change: no EDW Change: no Remains way up on fluids - refused extra HD and left AMA Bath Change: no  ANEMIA MANAGEMENT: Aranesp: Given: no    ESA dose for discharge: none to start, then per protocol IV Iron dose at discharge: per protocol Transfusion: Given: no  BONE/MINERAL MEDICATIONS: Hectorol/Calcitriol change: no Sensipar/Parsabiv change: no  ACCESS INTERVENTION/CHANGE: no Details:   RECENT LABS: Recent Labs  Lab 01/31/23 0430 01/31/23 1604  HGB 11.2*  --   NA 139  --   K 4.6  --   CALCIUM 6.2*  6.1*  --   PHOS  --  8.1*  ALBUMIN 3.1* 3.2*   IV ANTIBIOTICS: no Details:  OTHER ANTICOAGULATION: On Coumadin?: no  OTHER/APPTS/LAB ORDERS:  - S/p 1 short 2hr HD 9/13 at [facility], then refused further HD here  - Refused all meds at [facility], refused to have repeat labs after dialysis  - Palliative care consulted - he left prior to being seen by their team  - Pls ask SW to re-engage if possible  D/C Meds to be reconciled by nurse after every discharge.  Completed By: Ozzie Hoyle, PA-C [facility] Pager [phone]   Reviewed by: MD:______ RN_______

## 2023-02-03 LAB — PTH, INTACT AND CALCIUM
Calcium, Total (PTH): 6.1 mg/dL — CL (ref 8.7–10.2)
PTH: 208 pg/mL — ABNORMAL HIGH (ref 15–65)

## 2023-02-03 LAB — CALCIUM, IONIZED: Calcium, Ionized, Serum: 3.1 mg/dL — ABNORMAL LOW (ref 4.5–5.6)

## 2023-02-03 LAB — HEPATITIS B SURFACE ANTIBODY, QUANTITATIVE: Hep B S AB Quant (Post): <3.5 m[IU]/mL — ABNORMAL LOW

## 2023-02-03 NOTE — Progress Notes (Signed)
Late Note Entry- February 03, 2023  Contacted [facility] this morning to provide update regarding pt leaving AMA on Saturday. Pt can resume out-pt HD tomorrow but it is unknown if pt will show up for treatment.   Olivia Canter Renal Navigator [phone]

## 2023-02-04 ENCOUNTER — Other Ambulatory Visit: Payer: Self-pay

## 2023-02-04 ENCOUNTER — Inpatient Hospital Stay (HOSPITAL_COMMUNITY)
Admission: EM | Admit: 2023-02-04 | Discharge: 2023-02-15 | DRG: 280 | Disposition: A | Discharge status: Home / Self Care | Payer: 59 | Attending: Internal Medicine | Admitting: Internal Medicine

## 2023-02-04 ENCOUNTER — Emergency Department (HOSPITAL_COMMUNITY): Payer: 59

## 2023-02-04 DIAGNOSIS — M898X9 Other specified disorders of bone, unspecified site: Secondary | ICD-10-CM | POA: Diagnosis present

## 2023-02-04 DIAGNOSIS — D509 Iron deficiency anemia, unspecified: Secondary | ICD-10-CM | POA: Diagnosis present

## 2023-02-04 DIAGNOSIS — Z794 Long term (current) use of insulin: Secondary | ICD-10-CM

## 2023-02-04 DIAGNOSIS — D631 Anemia in chronic kidney disease: Secondary | ICD-10-CM | POA: Diagnosis present

## 2023-02-04 DIAGNOSIS — Z882 Allergy status to sulfonamides status: Secondary | ICD-10-CM

## 2023-02-04 DIAGNOSIS — N186 End stage renal disease: Secondary | ICD-10-CM

## 2023-02-04 DIAGNOSIS — R0602 Shortness of breath: Secondary | ICD-10-CM | POA: Diagnosis not present

## 2023-02-04 DIAGNOSIS — J9611 Chronic respiratory failure with hypoxia: Secondary | ICD-10-CM | POA: Diagnosis present

## 2023-02-04 DIAGNOSIS — I5022 Chronic systolic (congestive) heart failure: Secondary | ICD-10-CM | POA: Diagnosis present

## 2023-02-04 DIAGNOSIS — Z881 Allergy status to other antibiotic agents status: Secondary | ICD-10-CM

## 2023-02-04 DIAGNOSIS — R601 Generalized edema: Principal | ICD-10-CM

## 2023-02-04 DIAGNOSIS — R600 Localized edema: Secondary | ICD-10-CM | POA: Diagnosis not present

## 2023-02-04 DIAGNOSIS — E8770 Fluid overload, unspecified: Principal | ICD-10-CM

## 2023-02-04 DIAGNOSIS — I10 Essential (primary) hypertension: Secondary | ICD-10-CM | POA: Diagnosis not present

## 2023-02-04 DIAGNOSIS — Z5982 Transportation insecurity: Secondary | ICD-10-CM

## 2023-02-04 DIAGNOSIS — Z8052 Family history of malignant neoplasm of bladder: Secondary | ICD-10-CM

## 2023-02-04 DIAGNOSIS — Z992 Dependence on renal dialysis: Secondary | ICD-10-CM

## 2023-02-04 DIAGNOSIS — R609 Edema, unspecified: Secondary | ICD-10-CM | POA: Diagnosis not present

## 2023-02-04 DIAGNOSIS — I132 Hypertensive heart and chronic kidney disease with heart failure and with stage 5 chronic kidney disease, or end stage renal disease: Secondary | ICD-10-CM | POA: Diagnosis not present

## 2023-02-04 DIAGNOSIS — E872 Acidosis, unspecified: Secondary | ICD-10-CM | POA: Diagnosis present

## 2023-02-04 DIAGNOSIS — Z7982 Long term (current) use of aspirin: Secondary | ICD-10-CM

## 2023-02-04 DIAGNOSIS — E1122 Type 2 diabetes mellitus with diabetic chronic kidney disease: Secondary | ICD-10-CM | POA: Diagnosis present

## 2023-02-04 DIAGNOSIS — Z5900 Homelessness unspecified: Secondary | ICD-10-CM

## 2023-02-04 DIAGNOSIS — E119 Type 2 diabetes mellitus without complications: Secondary | ICD-10-CM

## 2023-02-04 DIAGNOSIS — E785 Hyperlipidemia, unspecified: Secondary | ICD-10-CM | POA: Diagnosis present

## 2023-02-04 DIAGNOSIS — Z79899 Other long term (current) drug therapy: Secondary | ICD-10-CM

## 2023-02-04 DIAGNOSIS — Z811 Family history of alcohol abuse and dependence: Secondary | ICD-10-CM

## 2023-02-04 DIAGNOSIS — Z91158 Patient's noncompliance with renal dialysis for other reason: Secondary | ICD-10-CM

## 2023-02-04 DIAGNOSIS — I21A1 Myocardial infarction type 2: Secondary | ICD-10-CM | POA: Diagnosis present

## 2023-02-04 DIAGNOSIS — I5023 Acute on chronic systolic (congestive) heart failure: Secondary | ICD-10-CM | POA: Diagnosis present

## 2023-02-04 DIAGNOSIS — K219 Gastro-esophageal reflux disease without esophagitis: Secondary | ICD-10-CM | POA: Diagnosis present

## 2023-02-04 DIAGNOSIS — R0989 Other specified symptoms and signs involving the circulatory and respiratory systems: Secondary | ICD-10-CM | POA: Diagnosis not present

## 2023-02-04 DIAGNOSIS — Z8249 Family history of ischemic heart disease and other diseases of the circulatory system: Secondary | ICD-10-CM

## 2023-02-04 DIAGNOSIS — I152 Hypertension secondary to endocrine disorders: Secondary | ICD-10-CM | POA: Diagnosis present

## 2023-02-04 HISTORY — DX: End stage renal disease: N18.6

## 2023-02-04 HISTORY — DX: Dependence on renal dialysis: Z99.2

## 2023-02-04 LAB — CBC
HCT: 36.5 % — ABNORMAL LOW (ref 39.0–52.0)
Hemoglobin: 11.2 g/dL — ABNORMAL LOW (ref 13.0–17.0)
MCH: 24.1 pg — ABNORMAL LOW (ref 26.0–34.0)
MCHC: 30.7 g/dL (ref 30.0–36.0)
MCV: 78.5 fL — ABNORMAL LOW (ref 80.0–100.0)
Platelets: 237 10*3/uL (ref 150–400)
RBC: 4.65 MIL/uL (ref 4.22–5.81)
RDW: 19.9 % — ABNORMAL HIGH (ref 11.5–15.5)
WBC: 8.6 10*3/uL (ref 4.0–10.5)
nRBC: 0 % (ref 0.0–0.2)

## 2023-02-04 LAB — BRAIN NATRIURETIC PEPTIDE: B Natriuretic Peptide: >4500 pg/mL — ABNORMAL HIGH (ref 0.0–100.0)

## 2023-02-04 LAB — BASIC METABOLIC PANEL
Anion gap: 19 — ABNORMAL HIGH (ref 5–15)
BUN: 115 mg/dL — ABNORMAL HIGH (ref 6–20)
CO2: 15 mmol/L — ABNORMAL LOW (ref 22–32)
Calcium: 5.7 mg/dL — CL (ref 8.9–10.3)
Chloride: 102 mmol/L (ref 98–111)
Creatinine, Ser: 16.63 mg/dL — ABNORMAL HIGH (ref 0.61–1.24)
GFR, Estimated: 3 mL/min — ABNORMAL LOW (ref >60)
Glucose, Bld: 127 mg/dL — ABNORMAL HIGH (ref 70–99)
Potassium: 4.4 mmol/L (ref 3.5–5.1)
Sodium: 136 mmol/L (ref 135–145)

## 2023-02-04 LAB — TROPONIN I (HIGH SENSITIVITY): Troponin I (High Sensitivity): 234 ng/L (ref <18)

## 2023-02-04 NOTE — ED Triage Notes (Signed)
Pt arrives to ED c/o bilateral leg edema x several days. Pt reports missing dialysis twice since Friday. Pt endorses SOB and no CP

## 2023-02-04 NOTE — ED Provider Notes (Signed)
[place] EMERGENCY DEPARTMENT AT [facility] Provider Note   CSN: [id number] Arrival date & time: 02/04/23  2105     History {Add pertinent medical, surgical, social history, OB history to HPI:1} Chief Complaint  Patient presents with   Leg Swelling    [name] is a 52 y.o. male.  52 yo M with a chief complaints of significant lower extremity edema.  This been going on for a few days.  He has missed at least 2 dialysis sessions.  Typically gets dialysis Monday Wednesday and Friday.  Feels little bit short of breath with this.  Denies any chest pain or pressure.        Home Medications Prior to Admission medications   Medication Sig Start Date End Date Taking? Authorizing Provider  acetaminophen (TYLENOL) 500 MG tablet Take 1 tablet (500 mg total) by mouth every 8 (eight) hours as needed for mild pain. 05/28/22   Leroy Sea, MD  ASPIRIN LOW DOSE 81 MG tablet Take 1 tablet (81 mg total) by mouth daily. 05/28/22   Leroy Sea, MD  benzonatate (TESSALON) 100 MG capsule Take 1 capsule (100 mg total) by mouth every 8 (eight) hours. 09/09/22   Sabas Sous, MD  calcitRIOL (ROCALTROL) 0.25 MCG capsule Take 1 capsule (0.25 mcg total) by mouth every Tuesday, Thursday, and Saturday at 6 PM. 10/26/22   Ghimire, Werner Lean, MD  calcium acetate (PHOSLO) 667 MG capsule Take 3 capsules (2,001 mg total) by mouth 3 (three) times daily. 05/28/22   Leroy Sea, MD  calcium acetate (PHOSLO) 667 MG tablet Take 3 tablets (2,001 mg total) by mouth 3 (three) times daily. 12/12/22     carvedilol (COREG) 25 MG tablet Take 1 tablet (25 mg total) by mouth 2 (two) times daily with a meal. 10/26/22   Ghimire, Werner Lean, MD  carvedilol (COREG) 25 MG tablet Take 1 tablet by mouth twice a day 12/12/22     doxycycline (VIBRAMYCIN) 100 MG capsule Take 1 capsule (100 mg total) by mouth 2 (two) times daily. 01/07/23   Mardella Layman, MD  glucose blood test strip Use to check fasting blood  sugar once daily. 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 10/26/22   [provider]  insulin aspart (NOVOLOG FLEXPEN) 100 UNIT/ML FlexPen 0-9 Units, Subcutaneous, 3 times daily with meals CBG < 70: Implement Hypoglycemia measures CBG 70 - 120: 0 units CBG 121 - 150: 1 unit CBG 151 - 200: 2 units CBG 201 - 250: 3 units CBG 251 - 300: 5 units CBG 301 - 350: 7 units CBG 351 - 400: 9 units CBG > 400: call MD 10/26/22   Maretta Bees, MD  insulin glargine-yfgn (SEMGLEE) 100 UNIT/ML injection Inject 0.07 mLs (7 Units total) into the skin at bedtime. 10/26/22   Ghimire, Werner Lean, MD  Insulin Pen Needle 32G X 4 MM MISC Use with insulin pens 05/28/22   Leroy Sea, MD  isosorbide mononitrate (IMDUR) 60 MG 24 hr tablet Take 1 tablet (60 mg total) by mouth daily. 10/26/22   Ghimire, Werner Lean, MD  isosorbide mononitrate (IMDUR) 60 MG 24 hr tablet Take 1 tablet (60 mg total) by mouth Nightly. 12/12/22     melatonin 3 MG TABS tablet Take 1 tablet (3 mg total) by mouth at bedtime as needed (insomnia). 10/26/22   Ghimire, Werner Lean, MD  mupirocin  ointment (BACTROBAN) 2 % Apply 1 Application topically 3 (three) times daily. 01/07/23   Mardella Layman, MD  oxyCODONE-acetaminophen (PERCOCET/ROXICET) 5-325 MG tablet Take 1 tablet by mouth every 12 (twelve) hours as needed for severe pain. 10/26/22   Ghimire, Werner Lean, MD  PROTONIX 40 MG tablet Take 1 tablet (40 mg total) by mouth daily. 05/25/22   Leroy Sea, MD  simethicone (MYLICON) 80 MG chewable tablet Chew 1 tablet (80 mg total) by mouth 4 (four) times daily for 5 days. 11/22/22 12/17/22  Rodolph Bong, MD  gabapentin (NEURONTIN) 300 MG capsule Take 1 capsule (300 mg total) by mouth 3 (three) times daily. Patient not taking: Reported on 07/27/2019 07/29/18 08/30/19  Kallie Locks, FNP  sildenafil (VIAGRA) 25 MG tablet Take 1 tablet (25 mg total) by mouth as needed for  erectile dysfunction. for erectile dysfunction. Patient not taking: Reported on 08/11/2020 10/13/19 11/27/20  Kallie Locks, FNP      Allergies    Sulfa antibiotics, Other, Bactrim [sulfamethoxazole-trimethoprim], and Cephalexin    Review of Systems   Review of Systems  Physical Exam Updated Vital Signs BP (!) 172/95 (BP Location: Left Arm)   Pulse 84   Temp 98.3 F (36.8 C) (Oral)   Resp 18   Ht 5\' 7"  (1.702 m)   Wt 79 kg   SpO2 98%   BMI 27.28 kg/m  Physical Exam Vitals and nursing note reviewed.  Constitutional:      Appearance: He is well-developed.  HENT:     Head: Normocephalic and atraumatic.     Mouth/Throat:     Comments: JVD to the jaw  Eyes:     Pupils: Pupils are equal, round, and reactive to light.  Neck:     Vascular: No JVD.  Cardiovascular:     Rate and Rhythm: Normal rate and regular rhythm.     Heart sounds: No murmur heard.    No friction rub. No gallop.  Pulmonary:     Effort: No respiratory distress.     Breath sounds: No wheezing.  Abdominal:     General: There is no distension.     Tenderness: There is no abdominal tenderness. There is no guarding or rebound.  Musculoskeletal:        General: Normal range of motion.     Cervical back: Normal range of motion and neck supple.     Right lower leg: Edema present.     Left lower leg: Edema present.  Skin:    Coloration: Skin is not pale.     Findings: No rash.  Neurological:     Mental Status: He is alert and oriented to person, place, and time.  Psychiatric:        Behavior: Behavior normal.     ED Results / Procedures / Treatments   Labs (all labs ordered are listed, but only abnormal results are displayed) Labs Reviewed  CBC - Abnormal; Notable for the following components:      Result Value   Hemoglobin 11.2 (*)    HCT 36.5 (*)    MCV 78.5 (*)    MCH 24.1 (*)    RDW 19.9 (*)    All other components within normal limits  BRAIN NATRIURETIC PEPTIDE - Abnormal; Notable for the  following components:   B Natriuretic Peptide >4,500.0 (*)    All other components within normal limits  BASIC METABOLIC PANEL - Abnormal; Notable for the following components:   CO2 15 (*)  Glucose, Bld 127 (*)    BUN 115 (*)    Creatinine, Ser 16.63 (*)    Calcium 5.7 (*)    GFR, Estimated 3 (*)    Anion gap 19 (*)    All other components within normal limits  TROPONIN I (HIGH SENSITIVITY) - Abnormal; Notable for the following components:   Troponin I (High Sensitivity) 234 (*)    All other components within normal limits    EKG EKG Interpretation Date/Time:  Tuesday February 04 2023 21:19:20 EDT Ventricular Rate:  82 PR Interval:  152 QRS Duration:  158 QT Interval:  484 QTC Calculation: 565 R Axis:   133  Text Interpretation: Normal sinus rhythm Non-specific intra-ventricular conduction block Possible Lateral infarct , age undetermined Abnormal ECG No significant change since last tracing Confirmed by Melene Plan [id number]) on 02/04/2023 10:18:24 PM  Radiology DG Chest 2 View  Result Date: 02/04/2023 CLINICAL DATA:  Short of breath EXAM: CHEST - 2 VIEW COMPARISON:  01/30/2023 FINDINGS: Frontal and lateral views of the chest demonstrates stable enlargement of the cardiac silhouette. Persistent central vascular congestion without airspace disease, effusion, or pneumothorax. No acute bony abnormality. IMPRESSION: 1. Stable enlarged cardiac silhouette and pulmonary vascular congestion. No overt edema. Electronically Signed   By: Sharlet Salina M.D.   On: 02/04/2023 22:08    Procedures .Critical Care  Performed by: Melene Plan, DO Authorized by: Melene Plan, DO   Critical care provider statement:    Critical care time (minutes):  35   Critical care time was exclusive of:  Separately billable procedures and treating other patients   Critical care was time spent personally by me on the following activities:  Development of treatment plan with patient or surrogate, discussions with  consultants, evaluation of patient's response to treatment, examination of patient, ordering and review of laboratory studies, ordering and review of radiographic studies, ordering and performing treatments and interventions, pulse oximetry, re-evaluation of patient's condition and review of old charts   Care discussed with: admitting provider     {Document cardiac monitor, telemetry assessment procedure when appropriate:1}  Medications Ordered in ED Medications - No data to display  ED Course/ Medical Decision Making/ A&P   {   Click here for ABCD2, HEART and other calculatorsREFRESH Note before signing :1}                              Medical Decision Making Amount and/or Complexity of Data Reviewed Labs: ordered. Radiology: ordered.   52 yo M with a chief complaints of feeling he needs dialysis.  Patient has missed at least 2 dialysis sessions.  Clinically is fluid overloaded.  Not requiring oxygen no significant tachypnea.  He had labs done through the [facility] process, his troponin is slowly trended upwards over some weeks.  His BUN is 115.  He has a metabolic acidosis with anion gap.  Chest x-ray independently interpreted by me with pulmonary vascular congestion.  Unchanged from prior.  Will discuss with nephrology.  I discussed case with Allena Katz, nephrology agrees with the patient needed urgent dialysis.  Unfortunately will not be able to occur this evening and will happen in the morning.  Will discuss with medicine.  The patients results and plan were reviewed and discussed.   Any x-rays performed were independently reviewed by myself.   Differential diagnosis were considered with the presenting HPI.  Medications - No data to display  Vitals:   02/04/23 2107  02/04/23 2109  BP:  (!) 172/95  Pulse:  84  Resp:  18  Temp:  98.3 F (36.8 C)  TempSrc:  Oral  SpO2:  98%  Weight: 79 kg   Height: 5\' 7"  (1.702 m)     Final diagnoses:  None    Admission/ observation were  discussed with the admitting physician, patient and/or family and they are comfortable with the plan.    {Document critical care time when appropriate:1} {Document review of labs and clinical decision tools ie heart score, Chads2Vasc2 etc:1}  {Document your independent review of radiology images, and any outside records:1} {Document your discussion with family members, caretakers, and with consultants:1} {Document social determinants of health affecting pt's care:1} {Document your decision making why or why not admission, treatments were needed:1} Final Clinical Impression(s) / ED Diagnoses Final diagnoses:  None    Rx / DC Orders ED Discharge Orders     None

## 2023-02-05 ENCOUNTER — Encounter (HOSPITAL_COMMUNITY): Payer: Self-pay | Admitting: Internal Medicine

## 2023-02-05 DIAGNOSIS — E8770 Fluid overload, unspecified: Secondary | ICD-10-CM | POA: Diagnosis present

## 2023-02-05 DIAGNOSIS — Z91158 Patient's noncompliance with renal dialysis for other reason: Secondary | ICD-10-CM

## 2023-02-05 DIAGNOSIS — Z8052 Family history of malignant neoplasm of bladder: Secondary | ICD-10-CM | POA: Diagnosis not present

## 2023-02-05 DIAGNOSIS — N186 End stage renal disease: Secondary | ICD-10-CM

## 2023-02-05 DIAGNOSIS — Z7982 Long term (current) use of aspirin: Secondary | ICD-10-CM | POA: Diagnosis not present

## 2023-02-05 DIAGNOSIS — Z882 Allergy status to sulfonamides status: Secondary | ICD-10-CM | POA: Diagnosis not present

## 2023-02-05 DIAGNOSIS — Z8249 Family history of ischemic heart disease and other diseases of the circulatory system: Secondary | ICD-10-CM | POA: Diagnosis not present

## 2023-02-05 DIAGNOSIS — I21A1 Myocardial infarction type 2: Secondary | ICD-10-CM | POA: Diagnosis not present

## 2023-02-05 DIAGNOSIS — E119 Type 2 diabetes mellitus without complications: Secondary | ICD-10-CM | POA: Diagnosis not present

## 2023-02-05 DIAGNOSIS — D631 Anemia in chronic kidney disease: Secondary | ICD-10-CM | POA: Diagnosis not present

## 2023-02-05 DIAGNOSIS — E8729 Other acidosis: Secondary | ICD-10-CM | POA: Diagnosis not present

## 2023-02-05 DIAGNOSIS — Z79899 Other long term (current) drug therapy: Secondary | ICD-10-CM | POA: Diagnosis not present

## 2023-02-05 DIAGNOSIS — E785 Hyperlipidemia, unspecified: Secondary | ICD-10-CM | POA: Diagnosis not present

## 2023-02-05 DIAGNOSIS — Z5982 Transportation insecurity: Secondary | ICD-10-CM | POA: Diagnosis not present

## 2023-02-05 DIAGNOSIS — Z811 Family history of alcohol abuse and dependence: Secondary | ICD-10-CM | POA: Diagnosis not present

## 2023-02-05 DIAGNOSIS — R7989 Other specified abnormal findings of blood chemistry: Secondary | ICD-10-CM | POA: Diagnosis not present

## 2023-02-05 DIAGNOSIS — Z5321 Procedure and treatment not carried out due to patient leaving prior to being seen by health care provider: Secondary | ICD-10-CM | POA: Diagnosis not present

## 2023-02-05 DIAGNOSIS — N250 Renal osteodystrophy: Secondary | ICD-10-CM | POA: Diagnosis not present

## 2023-02-05 DIAGNOSIS — I5022 Chronic systolic (congestive) heart failure: Secondary | ICD-10-CM

## 2023-02-05 DIAGNOSIS — E8779 Other fluid overload: Secondary | ICD-10-CM | POA: Diagnosis not present

## 2023-02-05 DIAGNOSIS — J9611 Chronic respiratory failure with hypoxia: Secondary | ICD-10-CM | POA: Diagnosis not present

## 2023-02-05 DIAGNOSIS — D509 Iron deficiency anemia, unspecified: Secondary | ICD-10-CM | POA: Diagnosis not present

## 2023-02-05 DIAGNOSIS — Z992 Dependence on renal dialysis: Secondary | ICD-10-CM | POA: Diagnosis not present

## 2023-02-05 DIAGNOSIS — Z5900 Homelessness unspecified: Secondary | ICD-10-CM | POA: Diagnosis not present

## 2023-02-05 DIAGNOSIS — E872 Acidosis, unspecified: Secondary | ICD-10-CM | POA: Diagnosis not present

## 2023-02-05 DIAGNOSIS — K219 Gastro-esophageal reflux disease without esophagitis: Secondary | ICD-10-CM

## 2023-02-05 DIAGNOSIS — Z794 Long term (current) use of insulin: Secondary | ICD-10-CM

## 2023-02-05 DIAGNOSIS — E1122 Type 2 diabetes mellitus with diabetic chronic kidney disease: Secondary | ICD-10-CM | POA: Diagnosis not present

## 2023-02-05 DIAGNOSIS — I10 Essential (primary) hypertension: Secondary | ICD-10-CM | POA: Diagnosis not present

## 2023-02-05 DIAGNOSIS — I5023 Acute on chronic systolic (congestive) heart failure: Secondary | ICD-10-CM | POA: Diagnosis not present

## 2023-02-05 DIAGNOSIS — I132 Hypertensive heart and chronic kidney disease with heart failure and with stage 5 chronic kidney disease, or end stage renal disease: Secondary | ICD-10-CM | POA: Diagnosis not present

## 2023-02-05 DIAGNOSIS — Z4931 Encounter for adequacy testing for hemodialysis: Secondary | ICD-10-CM | POA: Diagnosis present

## 2023-02-05 DIAGNOSIS — M898X9 Other specified disorders of bone, unspecified site: Secondary | ICD-10-CM | POA: Diagnosis not present

## 2023-02-05 DIAGNOSIS — R0602 Shortness of breath: Secondary | ICD-10-CM | POA: Diagnosis not present

## 2023-02-05 DIAGNOSIS — Z881 Allergy status to other antibiotic agents status: Secondary | ICD-10-CM | POA: Diagnosis not present

## 2023-02-05 LAB — COMPREHENSIVE METABOLIC PANEL WITH GFR
ALT: 17 U/L (ref 0–44)
AST: 19 U/L (ref 15–41)
Albumin: 2.9 g/dL — ABNORMAL LOW (ref 3.5–5.0)
Alkaline Phosphatase: 170 U/L — ABNORMAL HIGH (ref 38–126)
Anion gap: 21 — ABNORMAL HIGH (ref 5–15)
BUN: 118 mg/dL — ABNORMAL HIGH (ref 6–20)
CO2: 15 mmol/L — ABNORMAL LOW (ref 22–32)
Calcium: 5.7 mg/dL — CL (ref 8.9–10.3)
Chloride: 102 mmol/L (ref 98–111)
Creatinine, Ser: 17.12 mg/dL — ABNORMAL HIGH (ref 0.61–1.24)
GFR, Estimated: 3 mL/min — ABNORMAL LOW (ref >60)
Glucose, Bld: 99 mg/dL (ref 70–99)
Potassium: 4.5 mmol/L (ref 3.5–5.1)
Sodium: 138 mmol/L (ref 135–145)
Total Bilirubin: 0.6 mg/dL (ref 0.3–1.2)
Total Protein: 6.9 g/dL (ref 6.5–8.1)

## 2023-02-05 LAB — CBC
HCT: 32.7 % — ABNORMAL LOW (ref 39.0–52.0)
Hemoglobin: 10.3 g/dL — ABNORMAL LOW (ref 13.0–17.0)
MCH: 24.7 pg — ABNORMAL LOW (ref 26.0–34.0)
MCHC: 31.5 g/dL (ref 30.0–36.0)
MCV: 78.4 fL — ABNORMAL LOW (ref 80.0–100.0)
Platelets: 241 10*3/uL (ref 150–400)
RBC: 4.17 MIL/uL — ABNORMAL LOW (ref 4.22–5.81)
RDW: 19.4 % — ABNORMAL HIGH (ref 11.5–15.5)
WBC: 6.9 10*3/uL (ref 4.0–10.5)
nRBC: 0 % (ref 0.0–0.2)

## 2023-02-05 LAB — CBG MONITORING, ED: Glucose-Capillary: 171 mg/dL — ABNORMAL HIGH (ref 70–99)

## 2023-02-05 LAB — TROPONIN I (HIGH SENSITIVITY): Troponin I (High Sensitivity): 259 ng/L (ref <18)

## 2023-02-05 MED ORDER — CALCITRIOL 0.25 MCG PO CAPS
0.2500 ug | ORAL_CAPSULE | ORAL | Status: DC
  Administered 2023-02-11 – 2023-02-13 (×2): 0.25 ug via ORAL
  Filled 2023-02-05 (×5): qty 1

## 2023-02-05 MED ORDER — SODIUM CHLORIDE 0.9% FLUSH
3.0000 mL | Freq: Two times a day (BID) | INTRAVENOUS | Status: DC
  Administered 2023-02-05 – 2023-02-11 (×5): 3 mL via INTRAVENOUS

## 2023-02-05 MED ORDER — ASPIRIN 81 MG PO TBEC
81.0000 mg | DELAYED_RELEASE_TABLET | Freq: Every day | ORAL | Status: DC
  Administered 2023-02-06 – 2023-02-13 (×5): 81 mg via ORAL
  Filled 2023-02-05 (×7): qty 1

## 2023-02-05 MED ORDER — CALCIUM CARBONATE ANTACID 500 MG PO CHEW
400.0000 mg | CHEWABLE_TABLET | Freq: Three times a day (TID) | ORAL | Status: DC
  Administered 2023-02-06 – 2023-02-07 (×4): 400 mg via ORAL
  Filled 2023-02-05 (×5): qty 2

## 2023-02-05 MED ORDER — SODIUM CHLORIDE 0.9 % IV SOLN: 250.0000 mL | INTRAVENOUS | Status: DC | PRN

## 2023-02-05 MED ORDER — CHLORHEXIDINE GLUCONATE CLOTH 2 % EX PADS: 6.0000 | MEDICATED_PAD | Freq: Every day | CUTANEOUS | Status: DC

## 2023-02-05 MED ORDER — INSULIN ASPART 100 UNIT/ML IJ SOLN
0.0000 [IU] | Freq: Three times a day (TID) | INTRAMUSCULAR | Status: DC
  Administered 2023-02-07 – 2023-02-10 (×4): 1 [IU] via SUBCUTANEOUS
  Administered 2023-02-11: 2 [IU] via SUBCUTANEOUS
  Administered 2023-02-14 – 2023-02-15 (×2): 1 [IU] via SUBCUTANEOUS

## 2023-02-05 MED ORDER — HEPARIN SODIUM (PORCINE) 1000 UNIT/ML DIALYSIS
2000.0000 [IU] | INTRAMUSCULAR | Status: DC | PRN
  Administered 2023-02-05: 2000 [IU] via INTRAVENOUS_CENTRAL
  Filled 2023-02-05 (×2): qty 2

## 2023-02-05 MED ORDER — CALCIUM ACETATE (PHOS BINDER) 667 MG PO CAPS
2001.0000 mg | ORAL_CAPSULE | Freq: Three times a day (TID) | ORAL | Status: DC
  Administered 2023-02-07 – 2023-02-15 (×14): 2001 mg via ORAL
  Filled 2023-02-05 (×20): qty 3

## 2023-02-05 MED ORDER — ISOSORBIDE MONONITRATE ER 60 MG PO TB24
60.0000 mg | ORAL_TABLET | Freq: Every day | ORAL | Status: DC
  Administered 2023-02-07: 60 mg via ORAL
  Filled 2023-02-05: qty 1

## 2023-02-05 MED ORDER — INSULIN GLARGINE-YFGN 100 UNIT/ML ~~LOC~~ SOLN
7.0000 [IU] | Freq: Every day | SUBCUTANEOUS | Status: DC
  Administered 2023-02-07 – 2023-02-13 (×2): 7 [IU] via SUBCUTANEOUS
  Filled 2023-02-05 (×11): qty 0.07

## 2023-02-05 MED ORDER — ACETAMINOPHEN 325 MG PO TABS: 650.0000 mg | ORAL_TABLET | Freq: Four times a day (QID) | ORAL | Status: DC | PRN

## 2023-02-05 MED ORDER — HEPARIN SODIUM (PORCINE) 5000 UNIT/ML IJ SOLN
5000.0000 [IU] | Freq: Three times a day (TID) | INTRAMUSCULAR | Status: DC
  Administered 2023-02-07 – 2023-02-12 (×4): 5000 [IU] via SUBCUTANEOUS
  Filled 2023-02-05 (×16): qty 1

## 2023-02-05 MED ORDER — ACETAMINOPHEN 650 MG RE SUPP: 650.0000 mg | Freq: Four times a day (QID) | RECTAL | Status: DC | PRN

## 2023-02-05 MED ORDER — PANTOPRAZOLE SODIUM 40 MG PO TBEC
40.0000 mg | DELAYED_RELEASE_TABLET | Freq: Every day | ORAL | Status: DC
  Administered 2023-02-06 – 2023-02-13 (×5): 40 mg via ORAL
  Filled 2023-02-05 (×7): qty 1

## 2023-02-05 MED ORDER — CALCIUM CARBONATE ANTACID 500 MG PO CHEW: 400.0000 mg | CHEWABLE_TABLET | Freq: Three times a day (TID) | ORAL | Status: DC

## 2023-02-05 MED ORDER — SODIUM CHLORIDE 0.9% FLUSH: 3.0000 mL | INTRAVENOUS | Status: DC | PRN

## 2023-02-05 MED ORDER — SENNOSIDES-DOCUSATE SODIUM 8.6-50 MG PO TABS: 1.0000 | ORAL_TABLET | Freq: Every evening | ORAL | Status: DC | PRN

## 2023-02-05 MED ORDER — HYDRALAZINE HCL 20 MG/ML IJ SOLN
10.0000 mg | Freq: Three times a day (TID) | INTRAMUSCULAR | Status: DC | PRN
  Administered 2023-02-05: 10 mg via INTRAVENOUS
  Filled 2023-02-05: qty 1

## 2023-02-05 MED ORDER — HYDRALAZINE HCL 20 MG/ML IJ SOLN
10.0000 mg | INTRAMUSCULAR | Status: DC | PRN
  Administered 2023-02-05: 10 mg via INTRAVENOUS
  Filled 2023-02-05: qty 1

## 2023-02-05 MED ORDER — CARVEDILOL 25 MG PO TABS
25.0000 mg | ORAL_TABLET | Freq: Two times a day (BID) | ORAL | Status: DC
  Administered 2023-02-06 – 2023-02-07 (×2): 25 mg via ORAL
  Filled 2023-02-05: qty 2
  Filled 2023-02-05: qty 1
  Filled 2023-02-05: qty 2

## 2023-02-05 MED ORDER — CALCIUM GLUCONATE-NACL 2-0.675 GM/100ML-% IV SOLN
2.0000 g | Freq: Once | INTRAVENOUS | Status: DC
  Filled 2023-02-05: qty 100

## 2023-02-05 NOTE — ED Notes (Signed)
Pt covered in feces, putting his hand between his buttocks & spreading the feces on bilateral legs. Pt will not answer questions other than to state he wants something to eat.

## 2023-02-05 NOTE — Assessment & Plan Note (Addendum)
Continue insulin sliding scale for glucose cover and monitoring. Basal insulin 7 units serum glucose today 116 mg/dl.

## 2023-02-05 NOTE — H&P (Signed)
History and Physical    NUR KRALIK AOZ:[id number] DOB: Jul 15, 1970 DOA: 02/04/2023  PCP: [name]   Patient coming from: Homelessness   Chief Complaint:  Chief Complaint  Patient presents with   Leg Swelling    HPI:  [name] is a 52 y.o. male with medical history significant of ESRD on HD TTS schedule, congestive heart failure reduced EF 20 to 25%, diabetes type 2, essential hypertension, generalized anxiety disorder, anemia of chronic disease and GERD sent to emergency department complaining of bilateral leg swelling for several days.  Patient reported missing dialysis since 02/01/2023. He was admitted on 9/13 for volume overload in the setting of missing dialysis.  He left AMA on 9/14.  Of note patient has not gone for dialysis entire month of August 2024. Patient reported that since he has been out of the hospital from 9/14 he did not went for dialysis as to maintain the dialysis unit timing with his transportation is difficult.  Patient reported that at the HD unit he got timing to reach at 6:40 AM but he does not has transportation arranged for the morning rather than in the afternoon.  He is requesting for to arrange dialysis in the afternoon rather than in the early morning. During my evaluation, patient is complaining about worsening swelling of the bilateral lower extremities shortness of breath on exertion.  Denies any chest pain, chest pressure.  Patient denies any headache, dizziness, nausea, vomiting and diarrhea.  Reported that he has been taking his blood pressure regimen on daily basis.    ED Course:  At presentation to ED heart rate 84, respiratory 18, blood pressure 172 slighty 5 O2 sat 98% room air. CBC grossly unremarkable except evidence of chronic anemia hemoglobin 11.2 MCV 78.  BMP showed sodium 136, potassium 4.4, chloride 102, low bicarb 15, blood glucose 127, creatinine 16, elevated BUN 115, low calcium 5.7, low GFR 3, elevated anion gap  13.  Elevated BNP above 4500 which has been chronically elevated for last 2 months.  Troponin elevated 234 (patient baseline is around 137- 207).  EKG showed normal sinus rhythm left ventricular hypertrophy pattern, nonspecific intraventricular conduction block.  No ST and T wave abnormality.  Chest x-ray showed pulmonary vascular congestion, stable enlarged cardiac silhouette no overt edema.  ER physician and spoken with nephrology Dr. Allena Katz plan to do dialysis in the morning.  Hospitalist has been contacted for further evaluation and management of volume overload.  Review of Systems:  Review of Systems  Constitutional:  Negative for chills, fever, malaise/fatigue and weight loss.  Respiratory:  Positive for shortness of breath. Negative for cough, sputum production and wheezing.   Cardiovascular:  Positive for leg swelling. Negative for chest pain, orthopnea and claudication.  Gastrointestinal:  Negative for abdominal pain, heartburn, nausea and vomiting.  Musculoskeletal:  Negative for back pain, joint pain, myalgias and neck pain.  Neurological:  Negative for dizziness and headaches.  Psychiatric/Behavioral:  The patient is not nervous/anxious.     Past Medical History:  Diagnosis Date   Anxiety 02/2019   Anxiety 10/2019   Diabetes mellitus type II, uncontrolled 07/17/2006   Elevated alkaline phosphatase level 01/2019   ESSENTIAL HYPERTENSION 07/17/2006   Homelessness 10/13/2021   HYPERLIPIDEMIA 11/24/2008   Insomnia 02/2019   Left bundle branch block 02/22/2019   Lesion of lip 10/2019   Pneumonia 10/22/2011   Renal disorder    Suicidal ideations 02/2019   Vitamin D deficiency 01/2019    Past Surgical History:  Procedure Laterality Date   ARM SURGERY     AV FISTULA PLACEMENT Right 02/20/2021   Procedure: RIGHT ARTERIOVENOUS GRAFT CREATION;  Surgeon: Chuck Hint, MD;  Location: [facility] OR;  Service: Vascular;  Laterality: Right;   INSERTION OF DIALYSIS CATHETER  Left 02/20/2021   Procedure: INSERTION OF DIALYSIS CATHETER USING PALINDROME CATHETER;  Surgeon: Chuck Hint, MD;  Location: [facility] OR;  Service: Vascular;  Laterality: Left;   IR AV DIALY SHUNT INTRO NEEDLE/INTRACATH INITIAL W/PTA/IMG RIGHT Right 11/06/2021   IR FLUORO GUIDE CV LINE RIGHT  09/12/2022   IR REMOVAL TUN CV CATH W/O FL  07/27/2021   IR THROMBECTOMY AV FISTULA W/THROMBOLYSIS INC/SHUNT/IMG RIGHT  09/11/2022   IR US GUIDE VASC ACCESS RIGHT  11/06/2021   IR US GUIDE VASC ACCESS RIGHT  09/11/2022   IR US GUIDE VASC ACCESS RIGHT  09/12/2022   TEE WITHOUT CARDIOVERSION N/A 10/25/2022   Procedure: TRANSESOPHAGEAL ECHOCARDIOGRAM;  Surgeon: Chilton Si, MD;  Location: [facility] INVASIVE CV LAB;  Service: Cardiovascular;  Laterality: N/A;   ULTRASOUND GUIDANCE FOR VASCULAR ACCESS  02/20/2021   Procedure: ULTRASOUND GUIDANCE FOR VASCULAR ACCESS;  Surgeon: Chuck Hint, MD;  Location: [facility] OR;  Service: Vascular;;     reports that he has never smoked. He has never used smokeless tobacco. He reports that he does not drink alcohol and does not use drugs.  Allergies  Allergen Reactions   Sulfa Antibiotics Rash    Severe rash.  Severe rash.     Severe rash.   Other Other (See Comments)    Patient states he is allergic to the TB test - unknown reaction during childhood    Bactrim [Sulfamethoxazole-Trimethoprim] Rash   Cephalexin Other (See Comments) and Rash    Patient with severe drug reaction including exfoliating skin rash and hypotension after being prescribed both cephalexin and sulfamethoxazole-trimethoprim simultaneously. Favor SMX as much more likely culprit as patient had tolerated other cephalosporins in the past, but cannot say with complete certainty that this was not related to cephalexin.  Other Reaction(s): Other, Other (See Comments)  Patient with severe drug reaction including exfoliating skin rash and hypotension after being prescribed both cephalexin and  sulfamethoxazole-trimethoprim simultaneously. Favor SMX as much more likely culprit as patient had tolerated other cephalosporins in the past, but cannot say with complete certainty that this was not related to cephalexin.   "Patient with severe drug reaction including exfoliating skin rash and hypotension after being prescribed both cephalexin and sulfamethoxazole-trimethoprim simultaneously. Favor SMX as much more likely culprit as patient had tolerated other cephalosporins in the past, but cannot say with complete certainty that this was not related to cephalexin." As noted in [facility]   "Patient with severe drug reaction including exfoliating skin rash and hypotension after being prescribed both cephalexin and sulfamethoxazole-trimethoprim simultaneously. Favor SMX as much more likely culprit as patient had tolerated other cephalosporins in the past, but cannot say with complete certainty that this was not related to cephalexin." As noted in [facility]   "Patient with severe drug reaction including exfoliating skin rash and hypotension after being prescribed both cephalexin and sulfamethoxazole-trimethoprim simultaneously. Favor SMX as much more likely culprit as patient had tolerated other cephalosporins in the past, but cannot say with complete certainty that this was not related to cephalexin." As noted in [facility]     Patient with severe drug reaction including exfoliating skin rash and hypotension after being prescribed both cephalexin and sulfamethoxazole-trimethoprim simultaneously. Favor SMX as much more likely  culprit as patient had tolerated other cephalosporins in the past, but cannot say with complete certainty that this was not related to cephalexin.  "Patient with severe drug reaction including exfoliating skin rash and hypotension after being prescribed both cephalexin and sulfamethoxazole-trimethoprim simultaneously. Favor SMX as much more likely culprit as patient had tolerated  other cephalosporins in the past, but cannot say with complete certainty that this was not related to cephalexin." As noted in [facility]  "Patient with severe drug reaction including exfoliating skin rash and hypotension after being prescribed both cephalexin and sulfamethoxazole-trimethoprim simultaneously. Favor SMX as much more likely culprit as patient had tolerated other cephalosporins in the past, but cannot say with complete certainty that this was not related to cephalexin." As noted in [facility]     Patient with severe drug reaction inc... (TRUNCATED)    Family History  Problem Relation Age of Onset   Hypertension Mother 18   Cancer Mother        bladder cancer   Alcohol abuse Father    Hypertension Brother     Prior to Admission medications   Medication Sig Start Date End Date Taking? Authorizing Provider  acetaminophen (TYLENOL) 500 MG tablet Take 1 tablet (500 mg total) by mouth every 8 (eight) hours as needed for mild pain. 05/28/22   Leroy Sea, MD  ASPIRIN LOW DOSE 81 MG tablet Take 1 tablet (81 mg total) by mouth daily. 05/28/22   Leroy Sea, MD  benzonatate (TESSALON) 100 MG capsule Take 1 capsule (100 mg total) by mouth every 8 (eight) hours. 09/09/22   Sabas Sous, MD  calcitRIOL (ROCALTROL) 0.25 MCG capsule Take 1 capsule (0.25 mcg total) by mouth every Tuesday, Thursday, and Saturday at 6 PM. 10/26/22   Ghimire, Werner Lean, MD  calcium acetate (PHOSLO) 667 MG capsule Take 3 capsules (2,001 mg total) by mouth 3 (three) times daily. 05/28/22   Leroy Sea, MD  calcium acetate (PHOSLO) 667 MG tablet Take 3 tablets (2,001 mg total) by mouth 3 (three) times daily. 12/12/22     carvedilol (COREG) 25 MG tablet Take 1 tablet (25 mg total) by mouth 2 (two) times daily with a meal. 10/26/22   Ghimire, Werner Lean, MD  carvedilol (COREG) 25 MG tablet Take 1 tablet by mouth twice a day 12/12/22     doxycycline (VIBRAMYCIN) 100 MG capsule Take 1 capsule (100 mg total) by  mouth 2 (two) times daily. 01/07/23   Mardella Layman, MD  glucose blood test strip Use to check fasting blood sugar once daily. 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 10/26/22   [provider]  insulin aspart (NOVOLOG FLEXPEN) 100 UNIT/ML FlexPen 0-9 Units, Subcutaneous, 3 times daily with meals CBG < 70: Implement Hypoglycemia measures CBG 70 - 120: 0 units CBG 121 - 150: 1 unit CBG 151 - 200: 2 units CBG 201 - 250: 3 units CBG 251 - 300: 5 units CBG 301 - 350: 7 units CBG 351 - 400: 9 units CBG > 400: call MD 10/26/22   Maretta Bees, MD  insulin glargine-yfgn (SEMGLEE) 100 UNIT/ML injection Inject 0.07 mLs (7 Units total) into the skin at bedtime. 10/26/22   Ghimire, Werner Lean, MD  Insulin Pen Needle 32G X 4 MM MISC Use with insulin pens 05/28/22   Leroy Sea, MD  isosorbide mononitrate (IMDUR) 60 MG 24 hr tablet Take  1 tablet (60 mg total) by mouth daily. 10/26/22   Ghimire, Werner Lean, MD  isosorbide mononitrate (IMDUR) 60 MG 24 hr tablet Take 1 tablet (60 mg total) by mouth Nightly. 12/12/22     melatonin 3 MG TABS tablet Take 1 tablet (3 mg total) by mouth at bedtime as needed (insomnia). 10/26/22   Ghimire, Werner Lean, MD  mupirocin ointment (BACTROBAN) 2 % Apply 1 Application topically 3 (three) times daily. 01/07/23   Mardella Layman, MD  oxyCODONE-acetaminophen (PERCOCET/ROXICET) 5-325 MG tablet Take 1 tablet by mouth every 12 (twelve) hours as needed for severe pain. 10/26/22   Ghimire, Werner Lean, MD  PROTONIX 40 MG tablet Take 1 tablet (40 mg total) by mouth daily. 05/25/22   Leroy Sea, MD  simethicone (MYLICON) 80 MG chewable tablet Chew 1 tablet (80 mg total) by mouth 4 (four) times daily for 5 days. 11/22/22 12/17/22  Rodolph Bong, MD  gabapentin (NEURONTIN) 300 MG capsule Take 1 capsule (300 mg total) by mouth 3 (three) times daily. Patient not taking: Reported on 07/27/2019 07/29/18 08/30/19   Kallie Locks, FNP  sildenafil (VIAGRA) 25 MG tablet Take 1 tablet (25 mg total) by mouth as needed for erectile dysfunction. for erectile dysfunction. Patient not taking: Reported on 08/11/2020 10/13/19 11/27/20  Kallie Locks, FNP     Physical Exam: Vitals:   02/04/23 2107 02/04/23 2109 02/05/23 0604  BP:  (!) 172/95   Pulse:  84   Resp:  18   Temp:  98.3 F (36.8 C) 97.7 F (36.5 C)  TempSrc:  Oral Oral  SpO2:  98%   Weight: 79 kg    Height: 5\' 7"  (1.702 m)      Physical Exam Constitutional:      General: He is not in acute distress.    Appearance: He is not ill-appearing.  HENT:     Head: Normocephalic.     Nose: Nose normal.     Mouth/Throat:     Mouth: Mucous membranes are moist.  Eyes:     Pupils: Pupils are equal, round, and reactive to light.  Cardiovascular:     Rate and Rhythm: Normal rate and regular rhythm.     Pulses: Normal pulses.     Heart sounds: Normal heart sounds. No murmur heard. Pulmonary:     Effort: Pulmonary effort is normal. No respiratory distress.     Breath sounds: Normal breath sounds. No wheezing.  Abdominal:     General: Bowel sounds are normal. There is no distension.     Palpations: There is no mass.  Musculoskeletal:        General: No tenderness.     Cervical back: Neck supple.     Right lower leg: Edema present.     Left lower leg: Edema present.  Skin:    Capillary Refill: Capillary refill takes less than 2 seconds.     Findings: No bruising, erythema, lesion or rash.  Neurological:     Mental Status: He is oriented to person, place, and time.  Psychiatric:        Mood and Affect: Mood normal.        Thought Content: Thought content normal.        Judgment: Judgment normal.      Labs on Admission: I have personally reviewed following labs and imaging studies  CBC: Recent Labs  Lab 01/30/23 1953 01/31/23 0430 02/04/23 2125  WBC 6.6 6.8 8.6  HGB 11.8* 11.2* 11.2*  HCT  38.3* 35.9* 36.5*  MCV 78.8* 77.5*  78.5*  PLT 265 245 237   Basic Metabolic Panel: Recent Labs  Lab 01/30/23 1953 01/31/23 0430 01/31/23 1604 02/04/23 2125  NA 139 139  --  136  K 4.6 4.6  --  4.4  CL 105 105  --  102  CO2 10* 12*  --  15*  GLUCOSE 110* 89  --  127*  BUN 122* 124*  --  115*  CREATININE 18.04* 18.59*  --  16.63*  CALCIUM 6.2* 6.2*  6.1*  --  5.7*  PHOS  --   --  8.1*  --    GFR: Estimated Creatinine Clearance: 5 mL/min (A) (by C-G formula based on SCr of 16.63 mg/dL (H)). Liver Function Tests: Recent Labs  Lab 01/31/23 0430 01/31/23 1604  AST 17  --   ALT 15  --   ALKPHOS 170*  --   BILITOT 0.8  --   PROT 7.3  --   ALBUMIN 3.1* 3.2*   No results for input(s): "LIPASE", "AMYLASE" in the last 168 hours. No results for input(s): "AMMONIA" in the last 168 hours. Coagulation Profile: No results for input(s): "INR", "PROTIME" in the last 168 hours. Cardiac Enzymes: Recent Labs  Lab 02/04/23 2125 02/04/23 2358  TROPONINIHS 234* 234*   BNP (last 3 results) Recent Labs    01/04/23 1628 01/31/23 0835 02/04/23 2125  BNP >4,500.0* >4,500.0* >4,500.0*   HbA1C: No results for input(s): "HGBA1C" in the last 72 hours. CBG: Recent Labs  Lab 01/31/23 0756 01/31/23 1312 01/31/23 1716 01/31/23 2200 02/01/23 1652  GLUCAP 76 79 130* 137* 124*   Lipid Profile: No results for input(s): "CHOL", "HDL", "LDLCALC", "TRIG", "CHOLHDL", "LDLDIRECT" in the last 72 hours. Thyroid Function Tests: No results for input(s): "TSH", "T4TOTAL", "FREET4", "T3FREE", "THYROIDAB" in the last 72 hours. Anemia Panel: No results for input(s): "VITAMINB12", "FOLATE", "FERRITIN", "TIBC", "IRON", "RETICCTPCT" in the last 72 hours. Urine analysis:    Component Value Date/Time   COLORURINE YELLOW 02/19/2021 0045   APPEARANCEUR HAZY (A) 02/19/2021 0045   APPEARANCEUR Clear 05/09/2020 1509   LABSPEC 1.013 02/19/2021 0045   PHURINE 5.0 02/19/2021 0045   GLUCOSEU >=500 (A) 02/19/2021 0045   HGBUR MODERATE (A)  02/19/2021 0045   BILIRUBINUR NEGATIVE 02/19/2021 0045   BILIRUBINUR Negative 05/09/2020 1509   KETONESUR NEGATIVE 02/19/2021 0045   PROTEINUR >=300 (A) 02/19/2021 0045   UROBILINOGEN 0.2 02/04/2020 1612   UROBILINOGEN 0.2 02/16/2014 0045   NITRITE NEGATIVE 02/19/2021 0045   LEUKOCYTESUR NEGATIVE 02/19/2021 0045    Radiological Exams on Admission: I have personally reviewed images DG Chest 2 View  Result Date: 02/04/2023 CLINICAL DATA:  Short of breath EXAM: CHEST - 2 VIEW COMPARISON:  01/30/2023 FINDINGS: Frontal and lateral views of the chest demonstrates stable enlargement of the cardiac silhouette. Persistent central vascular congestion without airspace disease, effusion, or pneumothorax. No acute bony abnormality. IMPRESSION: 1. Stable enlarged cardiac silhouette and pulmonary vascular congestion. No overt edema. Electronically Signed   By: Sharlet Salina M.D.   On: 02/04/2023 22:08    EKG: My personal interpretation of EKG shows: EKG showed normal sinus rhythm left ventricular hypertrophy pattern, nonspecific intraventricular conduction block.  No ST and T wave abnormality.     Assessment/Plan: Principal Problem:   Volume overload Active Problems:   Elevated brain natriuretic peptide (BNP) level   Noncompliance with renal dialysis   ESRD (end stage renal disease) (HCC)   Hypocalcemia   Elevated troponin  Diabetes mellitus type 2, insulin dependent (HCC)   Microcytic anemia   Metabolic acidosis, increased anion gap   GERD (gastroesophageal reflux disease)    Assessment and Plan: Volume overload secondary to missing dialysis ESRD on HD TTS schedule Noncompliant with dialysis (secondary to homelessness and transportation issues) Anion gap metabolic acidosis-due to missing dialysis -Patient is presenting with complaining complaining of worsening swelling of the bilateral lower extremities since he has been out of the hospital from 9/14.  Of note patient has been recently  admitted on 9/13.  Nephrologist recommended to continue serial dialysis for few days however patient left AMA on 9/14. -At presentation to ED blood pressure is 172/95, pulse 84 and O2 sat 90% room air. - Chest x-ray showed stable cardiomegaly, pulmonary vascular congestion.  No overt edema. -BMP showed low bicarb 15, anion gap 19. - Anion gap metabolic acidosis secondary to ESRD and missing dialysis - Volume overload secondary to missing dialysis - ER physician has been consulted nephrology Dr. Allena Katz plan to dialysis in the morning. - Current to monitor CMP and blood pressure trend. -Counseled patient at the bedside to compliant with dialysis in outpatient basis.  NSTMI-type II Elevated troponin - Troponin trending 234 x 2.  Patient baseline troponin around 200. -EKG showed normal sinus rhythm left ventricular hypertrophy pattern, nonspecific intraventricular conduction block.  No ST and T wave abnormality.  Patient denies any chest pain.  ACS ruled out. -Non-STEMI type II due to demand mismatch in the setting of volume overload and borderline elevated blood pressure.   -Continue to trend troponin until it started trending down. - Continue monitor for development of chest pain  Essential hypertension Chronic systolic heart failure reduced EF 20 to 25% Chronically elevated BNP Patient blood pressure is 172/95 on presentation. -Elevated BNP above 4500, per chart review patient BNP is chronically elevated. -Chest x-ray showed pulmonary vascular congestion. - Resumed previous medication includes Coreg 25 mg twice daily and Imdur 60 mg daily.  Will give blood pressure regimen after the dialysis session. - Starting hydralazine 10 mg every 6 hour as needed for systolic blood pressure more than 180 or diastolic blood pressure more than 110.   Hypocalcemia secondary to ESRD -BMP showed low calcium 5.8 -Low vitamin D 25-hydroxy level 22 and elevated PTH 208. - Giving calcium gluconate 2 g IV  once.    Insulin-dependent DM type II -A1c 7.3 checked on 01/31/2023. - Starting Semglee 7 units at bedtime, continue POC blood glucose checks 3 times daily with meal and bedtime with sliding scale insulin and at bedtime insulin as needed coverage.    Anemia of chronic disease Microcytic anemia to  -CBC showed stable H&H 11.1 and 36.  Low MCV 78. -Continue oral iron supplement.   GERD -Continue Protonix 40 mg daily    DVT prophylaxis:  SQ Heparin Code Status:  Full Code Diet: Renal diet Family Communication: No family member at the bedside Disposition Plan: Pending dialysis.  Pending improvement of volume overload Consults: Nephrology Admission status:   Inpatient, Telemetry bed  Severity of Illness: The appropriate patient status for this patient is INPATIENT. Inpatient status is judged to be reasonable and necessary in order to provide the required intensity of service to ensure the patient's safety. The patient's presenting symptoms, physical exam findings, and initial radiographic and laboratory data in the context of their chronic comorbidities is felt to place them at high risk for further clinical deterioration. Furthermore, it is not anticipated that the patient will be medically  stable for discharge from the hospital within 2 midnights of admission.   * I certify that at the point of admission it is my clinical judgment that the patient will require inpatient hospital care spanning beyond 2 midnights from the point of admission due to high intensity of service, high risk for further deterioration and high frequency of surveillance required.Marland Kitchen    Tereasa Coop, MD [facility]  How to contact the [facility] Attending or Consulting provider 7A - 7P or covering provider during after hours 7P -7A, for this patient.  Check the care team in [facility] and look for a) attending/consulting [facility] provider listed and b) the [facility] team listed Log into [email or web address] and use [place]'s  universal password to access. If you do not have the password, please contact the hospital operator. Locate the [facility] provider you are looking for under [facility] and page to a number that you can be directly reached. If you still have difficulty reaching the provider, please page the [facility] (Director on Call) for the Hospitalists listed on amion for assistance.  02/05/2023, 6:26 AM

## 2023-02-05 NOTE — ED Notes (Signed)
Was given during report patient is refusing all meds/blood draws.

## 2023-02-05 NOTE — Consult Note (Addendum)
Renal Service Consult Note [facility]  [name] 02/05/2023 Douglas Krabbe, MD Requesting Physician: Dr. Ella Jubilee  Reason for Consult: ESRD pt w/ homelessness and missed HD HPI: The patient is a 52 y.o. year-old w/ PMH as below who presented to ED last night for worsening LE edema for several days. Had missed HD x 2 since Friday as well. Pt was SOB but no CP in ED. Usual HD was MWF.  EF was 20-25%, also hx of DM2, HTN, GAD, anemia. Swelling of the legs and SOB are the main issues. Pt was admitted, we are asked to see for dialysis.   Pt seen in HD unit. Pt is not responding to my efforts to get information, won't open his eyes. History taking aborted.   ROS - n/a    Past Medical History  Past Medical History:  Diagnosis Date   Anxiety 02/2019   Anxiety 10/2019   Diabetes mellitus type II, uncontrolled 07/17/2006   Elevated alkaline phosphatase level 01/2019   ESSENTIAL HYPERTENSION 07/17/2006   Homelessness 10/13/2021   HYPERLIPIDEMIA 11/24/2008   Insomnia 02/2019   Left bundle branch block 02/22/2019   Lesion of lip 10/2019   Pneumonia 10/22/2011   Renal disorder    Suicidal ideations 02/2019   Vitamin D deficiency 01/2019   Past Surgical History  Past Surgical History:  Procedure Laterality Date   ARM SURGERY     AV FISTULA PLACEMENT Right 02/20/2021   Procedure: RIGHT ARTERIOVENOUS GRAFT CREATION;  Surgeon: Chuck Hint, MD;  Location: [facility] OR;  Service: Vascular;  Laterality: Right;   INSERTION OF DIALYSIS CATHETER Left 02/20/2021   Procedure: INSERTION OF DIALYSIS CATHETER USING PALINDROME CATHETER;  Surgeon: Chuck Hint, MD;  Location: [facility] OR;  Service: Vascular;  Laterality: Left;   IR AV DIALY SHUNT INTRO NEEDLE/INTRACATH INITIAL W/PTA/IMG RIGHT Right 11/06/2021   IR FLUORO GUIDE CV LINE RIGHT  09/12/2022   IR REMOVAL TUN CV CATH W/O FL  07/27/2021   IR THROMBECTOMY AV FISTULA W/THROMBOLYSIS INC/SHUNT/IMG RIGHT  09/11/2022   IR US  GUIDE VASC ACCESS RIGHT  11/06/2021   IR US GUIDE VASC ACCESS RIGHT  09/11/2022   IR US GUIDE VASC ACCESS RIGHT  09/12/2022   TEE WITHOUT CARDIOVERSION N/A 10/25/2022   Procedure: TRANSESOPHAGEAL ECHOCARDIOGRAM;  Surgeon: Chilton Si, MD;  Location: [facility] INVASIVE CV LAB;  Service: Cardiovascular;  Laterality: N/A;   ULTRASOUND GUIDANCE FOR VASCULAR ACCESS  02/20/2021   Procedure: ULTRASOUND GUIDANCE FOR VASCULAR ACCESS;  Surgeon: Chuck Hint, MD;  Location: [facility] OR;  Service: Vascular;;   Family History  Family History  Problem Relation Age of Onset   Hypertension Mother 44   Cancer Mother        bladder cancer   Alcohol abuse Father    Hypertension Brother    Social History  reports that he has never smoked. He has never used smokeless tobacco. He reports that he does not drink alcohol and does not use drugs. Allergies  Allergies  Allergen Reactions   Sulfa Antibiotics Rash    Severe rash.  Severe rash.     Severe rash.   Other Other (See Comments)    Patient states he is allergic to the TB test - unknown reaction during childhood    Bactrim [Sulfamethoxazole-Trimethoprim] Rash   Cephalexin Other (See Comments) and Rash    Patient with severe drug reaction including exfoliating skin rash and hypotension after being prescribed both cephalexin and sulfamethoxazole-trimethoprim simultaneously. Favor SMX as  much more likely culprit as patient had tolerated other cephalosporins in the past, but cannot say with complete certainty that this was not related to cephalexin.  Other Reaction(s): Other, Other (See Comments)  Patient with severe drug reaction including exfoliating skin rash and hypotension after being prescribed both cephalexin and sulfamethoxazole-trimethoprim simultaneously. Favor SMX as much more likely culprit as patient had tolerated other cephalosporins in the past, but cannot say with complete certainty that this was not related to cephalexin.   "Patient with  severe drug reaction including exfoliating skin rash and hypotension after being prescribed both cephalexin and sulfamethoxazole-trimethoprim simultaneously. Favor SMX as much more likely culprit as patient had tolerated other cephalosporins in the past, but cannot say with complete certainty that this was not related to cephalexin." As noted in [facility]   "Patient with severe drug reaction including exfoliating skin rash and hypotension after being prescribed both cephalexin and sulfamethoxazole-trimethoprim simultaneously. Favor SMX as much more likely culprit as patient had tolerated other cephalosporins in the past, but cannot say with complete certainty that this was not related to cephalexin." As noted in [facility]   "Patient with severe drug reaction including exfoliating skin rash and hypotension after being prescribed both cephalexin and sulfamethoxazole-trimethoprim simultaneously. Favor SMX as much more likely culprit as patient had tolerated other cephalosporins in the past, but cannot say with complete certainty that this was not related to cephalexin." As noted in [facility]     Patient with severe drug reaction including exfoliating skin rash and hypotension after being prescribed both cephalexin and sulfamethoxazole-trimethoprim simultaneously. Favor SMX as much more likely culprit as patient had tolerated other cephalosporins in the past, but cannot say with complete certainty that this was not related to cephalexin.  "Patient with severe drug reaction including exfoliating skin rash and hypotension after being prescribed both cephalexin and sulfamethoxazole-trimethoprim simultaneously. Favor SMX as much more likely culprit as patient had tolerated other cephalosporins in the past, but cannot say with complete certainty that this was not related to cephalexin." As noted in [facility]  "Patient with severe drug reaction including exfoliating skin rash and hypotension after being  prescribed both cephalexin and sulfamethoxazole-trimethoprim simultaneously. Favor SMX as much more likely culprit as patient had tolerated other cephalosporins in the past, but cannot say with complete certainty that this was not related to cephalexin." As noted in [facility]     Patient with severe drug reaction inc... (TRUNCATED)   Home medications Prior to Admission medications   Medication Sig Start Date End Date Taking? Authorizing Provider  acetaminophen (TYLENOL) 500 MG tablet Take 1 tablet (500 mg total) by mouth every 8 (eight) hours as needed for mild pain. 05/28/22   Leroy Sea, MD  ASPIRIN LOW DOSE 81 MG tablet Take 1 tablet (81 mg total) by mouth daily. 05/28/22   Leroy Sea, MD  benzonatate (TESSALON) 100 MG capsule Take 1 capsule (100 mg total) by mouth every 8 (eight) hours. 09/09/22   Sabas Sous, MD  calcitRIOL (ROCALTROL) 0.25 MCG capsule Take 1 capsule (0.25 mcg total) by mouth every Tuesday, Thursday, and Saturday at 6 PM. 10/26/22   Ghimire, Werner Lean, MD  calcium acetate (PHOSLO) 667 MG tablet Take 3 tablets (2,001 mg total) by mouth 3 (three) times daily. 12/12/22     carvedilol (COREG) 25 MG tablet Take 1 tablet (25 mg total) by mouth 2 (two) times daily with a meal. 10/26/22   Ghimire, Werner Lean, MD  carvedilol (COREG)  25 MG tablet Take 1 tablet by mouth twice a day 12/12/22     doxycycline (VIBRAMYCIN) 100 MG capsule Take 1 capsule (100 mg total) by mouth 2 (two) times daily. 01/07/23   Mardella Layman, MD  glucose blood test strip Use to check fasting blood sugar once daily. 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 10/26/22   [provider]  insulin aspart (NOVOLOG FLEXPEN) 100 UNIT/ML FlexPen 0-9 Units, Subcutaneous, 3 times daily with meals CBG < 70: Implement Hypoglycemia measures CBG 70 - 120: 0 units CBG 121 - 150: 1 unit CBG 151 - 200: 2 units CBG 201 - 250: 3 units  CBG 251 - 300: 5 units CBG 301 - 350: 7 units CBG 351 - 400: 9 units CBG > 400: call MD 10/26/22   Maretta Bees, MD  insulin glargine-yfgn (SEMGLEE) 100 UNIT/ML injection Inject 0.07 mLs (7 Units total) into the skin at bedtime. 10/26/22   Ghimire, Werner Lean, MD  Insulin Pen Needle 32G X 4 MM MISC Use with insulin pens 05/28/22   Leroy Sea, MD  isosorbide mononitrate (IMDUR) 60 MG 24 hr tablet Take 1 tablet (60 mg total) by mouth daily. 10/26/22   Ghimire, Werner Lean, MD  isosorbide mononitrate (IMDUR) 60 MG 24 hr tablet Take 1 tablet (60 mg total) by mouth Nightly. 12/12/22     melatonin 3 MG TABS tablet Take 1 tablet (3 mg total) by mouth at bedtime as needed (insomnia). 10/26/22   Ghimire, Werner Lean, MD  mupirocin ointment (BACTROBAN) 2 % Apply 1 Application topically 3 (three) times daily. 01/07/23   Mardella Layman, MD  oxyCODONE-acetaminophen (PERCOCET/ROXICET) 5-325 MG tablet Take 1 tablet by mouth every 12 (twelve) hours as needed for severe pain. 10/26/22   Ghimire, Werner Lean, MD  PROTONIX 40 MG tablet Take 1 tablet (40 mg total) by mouth daily. 05/25/22   Leroy Sea, MD  simethicone (MYLICON) 80 MG chewable tablet Chew 1 tablet (80 mg total) by mouth 4 (four) times daily for 5 days. 11/22/22 12/17/22  Rodolph Bong, MD  gabapentin (NEURONTIN) 300 MG capsule Take 1 capsule (300 mg total) by mouth 3 (three) times daily. Patient not taking: Reported on 07/27/2019 07/29/18 08/30/19  Kallie Locks, FNP  sildenafil (VIAGRA) 25 MG tablet Take 1 tablet (25 mg total) by mouth as needed for erectile dysfunction. for erectile dysfunction. Patient not taking: Reported on 08/11/2020 10/13/19 11/27/20  Kallie Locks, FNP     Vitals:   02/05/23 0845 02/05/23 0900 02/05/23 0930 02/05/23 1000  BP: (!) 207/99 (!) 194/97 (!) 205/92 (!) 197/87  Pulse: 78 77 81 83  Resp: 12 (!) 9 12 10   Temp:      TempSrc:      SpO2: 100% 100% 99% 96%  Weight:      Height:       Exam Gen sleeping, will not  open eyes to voice On room air No rash, cyanosis or gangrene Sclera anicteric, throat clear  No jvd or bruits Chest clear bilat to bases, no rales/ wheezing RRR no \\RG  Abd protuberant, +ascites most likely, no mass, +bs GU defer MS no joint effusions or deformity Ext 2-3+ bilat chronic pitting pretib edema, 1-2+ bilat hip edema Neuro - as above   RFA AVG+bruit     Home meds include - asa, rocaltrol 0.25 mcg po tts, phoslo 3 ac tid, coreg  25 bid, insulin humalog/ aspart/ glargine, imdur 60 hs, percocet, protonix, prns     OP HD: TTS [facility]  4h   400/800   61.5kg  2/2.5 bath  RFA AVG   Heparin 2000 - last OP HD 8/31, post wt 75kg - rocaltrol 0.25 mcg po three times per week - no esa, last Hb 12 on 8/31   CXR 9/17 - vasc congestion, mild. No edema.   Na 138  K 4.5 CO2 15  bun 118 creat 17  alb 2.9  Ca 5.7, CCa 6.5  Phos 8.1  Assessment/ Plan: Volume overload - due to missing HD. 10- 20kg up. On RA. Sig bilat LE edema. CXR w/o edema, +congestion. Plan is for HD today and again tomorrow.  ESRD - on HD TTS. Last OP HD was 8/31. Getting HD this am. Plan HD again tomorrow.  HTN - BP's high, getting home meds coreg/ imdur here. Get vol down w/ HD.  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 Not on esa at OP unit. Follow.   MBD ckd - CCa is very low, phos is a bit high. Resuming his po vdra and Ca++ containing binder (phoslo). Getting IV Ca. Will start po CaCO3 400mg  elemental tid as well.  DM2 - on insulin      Rob Kerrin Markman  MD [facility] 02/05/2023, 10:26 AM  Recent Labs  Lab 01/31/23 1604 02/04/23 2125 02/05/23 0809  HGB  --  11.2* 10.3*  ALBUMIN 3.2*  --  2.9*  CALCIUM  --  5.7* 5.7*  PHOS 8.1*  --   --   CREATININE  --  16.63* 17.12*  K  --  4.4 4.5   Inpatient medications:  aspirin EC  81 mg Oral Daily   [START ON 02/06/2023] calcitRIOL  0.25 mcg Oral Q T,Th,Sat-1800   calcium acetate  2,001 mg Oral TID WC   carvedilol  25 mg Oral BID WC   Chlorhexidine Gluconate Cloth  6 each Topical  Q0600   heparin  5,000 Units Subcutaneous Q8H   insulin glargine-yfgn  7 Units Subcutaneous QHS   isosorbide mononitrate  60 mg Oral QHS   pantoprazole  40 mg Oral Daily   sodium chloride flush  3 mL Intravenous Q12H    sodium chloride     calcium gluconate     sodium chloride, acetaminophen **OR** acetaminophen, heparin, hydrALAZINE, senna-docusate, sodium chloride flush

## 2023-02-05 NOTE — Assessment & Plan Note (Addendum)
Reduced LV systolic function.  Systolic blood pressure 123 to 115 mmHg.  To allow better ultrafiltration, patient will need a higher blood pressure. Plan to reduce isosorbide dose and discontinue amlodipine.  Continue carvedilol for now, to avoid rebound tachycardia.   Fluid management per ultrafiltration.

## 2023-02-05 NOTE — Progress Notes (Signed)
Date and time results received: 02/05/23 0909 (use smartphrase ".now" to insert current time)  Test: Ca and Troponin Critical Value: 5.7 and 259  Name of Provider Notified: Dr. Delano Metz (noted at bedside rounds), earlier before Lab called.  Orders Received? Or Actions Taken?: No new order yet.

## 2023-02-05 NOTE — ED Notes (Signed)
Patient refusing medication at this time. Patient also refusing to be hooked up to the monitor at this time.

## 2023-02-05 NOTE — Progress Notes (Signed)
At bedside to start PIV, pt. Uncooperative, went back to sleep and ignored this IV Nurse. RN notified.

## 2023-02-05 NOTE — Progress Notes (Signed)
Progress Note   Patient: [name] NFA:[id number] DOB: 22-Feb-1971 DOA: 02/04/2023     0 DOS: the patient was seen and examined on 02/05/2023   Brief hospital course: Mr. Krech was admitted to the hospital with the working diagnosis of heart failure exacerbation.   52 yo male with the past medical history of ESRD, heart failure, T2DM, hypertension and GERd who presented with worsening lower extremity edema. Last HD 02/01/23, four days prior to his hospitalization. Recent hospitalization 09/13 for volume overload, due to missing HD, he left on 09/14 against medical advice. Patient was note able to have HD as outpatient due to transportation difficulties.   Assessment and Plan: * Volume overload Patient had HD with improvement in his symptoms Today he continue to have edema.  He is homeless, need assistance in outpatient coordination for hemodialysis.   Metabolic bone disease, continue with calcitriol and phoslo.  Chronic systolic CHF reduced EF 20 to 46% (congestive heart failure) (HCC) Reduced LV systolic function. Continue with hypervolemic.  Plan to continue ultrafiltration for fluid management.   Diabetes mellitus type 2, insulin dependent (HCC) Continue insulin sliding scale for glucose cover and monitoring. Patient is tolerating po well.  Essential hypertension Continue blood pressure control with carvedilol and isosorbide.  Microcytic anemia Stable cell count.  Anemia of chronic renal disease.   GERD (gastroesophageal reflux disease) Continue PPI        Subjective: Patient is feeling better, but not yet back to baseline, continue to have lower extremity edema and some dyspnea.    Physical Exam: Vitals:   02/05/23 1100 02/05/23 1130 02/05/23 1145 02/05/23 1358  BP: (!) 212/89 (!) 190/82 (!) 177/70   Pulse: 90 91 90   Resp: 14 15 15    Temp:   98 F (36.7 C)   TempSrc:   Oral   SpO2: 98% 96% 98% 98%  Weight:      Height:        Neurology awake and  alert ENT with mild pallor Cardiovascular with S1 and S2 present and regular with no gallops Respiratory with rales at bases with no wheezing or rhonchi Abdomen with no distention  Positive lower extremity edema ++  Data Reviewed:    Family Communication: no family at the bedside   Disposition: Status is: Inpatient Remains inpatient appropriate because: renal replacement therapy   Planned Discharge Destination: Home  Author: Coralie Keens, MD 02/05/2023 5:42 PM  For on call review [email or web address].

## 2023-02-05 NOTE — ED Notes (Signed)
Pt back in treatment room 46, meal tray at bedside.  Stretcher locked in lowest position, call bell in reach.

## 2023-02-05 NOTE — Progress Notes (Signed)
Pt assigned to [facility]) on TTS schedule for out-pt HD. Will assist as needed.   Olivia Canter Renal Navigator [phone]

## 2023-02-05 NOTE — ED Notes (Signed)
Pt in restroom with door locked. This nurse knocked on the door asking if pt was okay or needed help.  Pt verbalized he was okay & didn't need anything at this time.

## 2023-02-05 NOTE — Assessment & Plan Note (Addendum)
Plan to continue carvedilol, decrease dose of isosorbide and discontinue amlodipine.

## 2023-02-05 NOTE — Progress Notes (Signed)
HD Progress Note:   02/05/23 1145  Vitals  Temp 98 F (36.7 C)  Temp Source Oral  BP (!) 177/70  MAP (mmHg) 99  BP Location Left Leg  BP Method Automatic  Patient Position (if appropriate) Lying  Pulse Rate 90  Pulse Rate Source Monitor  ECG Heart Rate 90  Resp 15  Oxygen Therapy  SpO2 98 %  O2 Device Room Air  During Treatment Monitoring  HD Safety Checks Performed Yes  Intra-Hemodialysis Comments Tx completed  Dialysis Fluid Bolus Normal Saline  Bolus Amount (mL) 300 mL  Post Treatment  Dialyzer Clearance Clear  Hemodialysis Intake (mL) 0 mL  Liters Processed 46  Fluid Removed (mL) 3000 mL  Tolerated HD Treatment No (Comment) (BP in the high side with 1 episode of nausea vominting x small amount undigested food, given 2 doses of PRN Hydralazine, denies headache, chest pain.)  AVG/AVF Arterial Site Held (minutes) 10 minutes  AVG/AVF Venous Site Held (minutes) 10 minutes  Fistula / Graft Right Forearm  No placement date or time found.   Placed prior to admission: Yes  Orientation: Right  Access Location: Forearm  Site Condition No complications  Fistula / Graft Assessment Present;Thrill;Bruit  Status Deaccessed  Drainage Description None

## 2023-02-05 NOTE — ED Notes (Signed)
IV team attempted to get IV pt refused.

## 2023-02-05 NOTE — Assessment & Plan Note (Addendum)
Hypervolemia. Hypocalcemia.   Today BUN is 67, Na 135 and K at 3,7, serum bicarbonate at 22.  Plan for HD today with further UF.   Metabolic bone disease, continue with calcitriol and phoslo.

## 2023-02-05 NOTE — Progress Notes (Signed)
Consult received for PIV placement. No infusions, tests or procedures ordered at this time. Secure chat sent to nurses notify that there are no orders to substantiate PIV placement at this time. D/t limited vasculature with R arm restriction. Notified to place orders to substantiate PIV placement as needed. Tomasita Morrow, RN VAST

## 2023-02-05 NOTE — Assessment & Plan Note (Signed)
Stable cell count.  Anemia of chronic renal disease.

## 2023-02-05 NOTE — ED Notes (Addendum)
Pt refused medications & vital signs.  Pt requested his gown be tied, another warm blanket & dinner tray.  Pt stated "if it is going to be a long time before I get a room upstairs I am just going to leave." Discussed risks of leaving AMA.

## 2023-02-05 NOTE — ED Notes (Signed)
Provided pt with washcloths, towels & new gown in order to clean himself. Pt stated understanding & was in agreement.

## 2023-02-05 NOTE — ED Notes (Signed)
Meal tray provided.

## 2023-02-05 NOTE — ED Notes (Signed)
Rn attempted to talk to pt about getting an IV pt refused.

## 2023-02-05 NOTE — Assessment & Plan Note (Signed)
Continue PPI ?

## 2023-02-05 NOTE — Hospital Course (Addendum)
[name] was admitted to the hospital with the working diagnosis of heart failure exacerbation.   52 yo male with the past medical history of ESRD, heart failure, T2DM, hypertension and GERd who presented with worsening lower extremity edema. Last HD 02/01/23, four days prior to his hospitalization. Recent hospitalization 09/13 for volume overload, due to missing HD, he left on 09/14 against medical advice. Patient was note able to have HD as outpatient due to transportation difficulties.   Patient has been treated with intense consecutive days of hemodialysis with ultrafiltration.   09/18, 09/19 HD/ UF with good toleration, 09/21 HD/UF.   09/22 patient has declined HD today, has been refusing care. He is homeless, plan for next HD on 09.24.  Plan for discharge home tomorrow, will give information for homeless shelters and will get bus passes for transportation to HD.

## 2023-02-05 NOTE — Procedures (Signed)
I was present at this dialysis session, have reviewed the session and made  appropriate changes Vinson Moselle MD  [facility] 02/05/2023, 9:22 AM

## 2023-02-05 NOTE — ED Notes (Signed)
Pt requested bedpan. Pt has been on bedpan for ten minutes and refuses to let staff remove bedpan. Pt educated on risks of sitting on bedpan for extended periods of time but still refuses.

## 2023-02-06 ENCOUNTER — Encounter (HOSPITAL_COMMUNITY): Payer: Self-pay | Admitting: Internal Medicine

## 2023-02-06 DIAGNOSIS — Z992 Dependence on renal dialysis: Secondary | ICD-10-CM | POA: Diagnosis not present

## 2023-02-06 DIAGNOSIS — E8779 Other fluid overload: Secondary | ICD-10-CM | POA: Diagnosis not present

## 2023-02-06 DIAGNOSIS — Z794 Long term (current) use of insulin: Secondary | ICD-10-CM | POA: Diagnosis not present

## 2023-02-06 DIAGNOSIS — E119 Type 2 diabetes mellitus without complications: Secondary | ICD-10-CM | POA: Diagnosis not present

## 2023-02-06 DIAGNOSIS — I10 Essential (primary) hypertension: Secondary | ICD-10-CM | POA: Diagnosis not present

## 2023-02-06 DIAGNOSIS — N186 End stage renal disease: Secondary | ICD-10-CM | POA: Diagnosis not present

## 2023-02-06 DIAGNOSIS — I5022 Chronic systolic (congestive) heart failure: Secondary | ICD-10-CM | POA: Diagnosis not present

## 2023-02-06 DIAGNOSIS — D509 Iron deficiency anemia, unspecified: Secondary | ICD-10-CM | POA: Diagnosis not present

## 2023-02-06 DIAGNOSIS — K219 Gastro-esophageal reflux disease without esophagitis: Secondary | ICD-10-CM | POA: Diagnosis not present

## 2023-02-06 DIAGNOSIS — E8770 Fluid overload, unspecified: Secondary | ICD-10-CM | POA: Diagnosis not present

## 2023-02-06 LAB — CBC
HCT: 32.3 % — ABNORMAL LOW (ref 39.0–52.0)
Hemoglobin: 10.3 g/dL — ABNORMAL LOW (ref 13.0–17.0)
MCH: 24.9 pg — ABNORMAL LOW (ref 26.0–34.0)
MCHC: 31.9 g/dL (ref 30.0–36.0)
MCV: 78 fL — ABNORMAL LOW (ref 80.0–100.0)
Platelets: 250 10*3/uL (ref 150–400)
RBC: 4.14 MIL/uL — ABNORMAL LOW (ref 4.22–5.81)
RDW: 19.7 % — ABNORMAL HIGH (ref 11.5–15.5)
WBC: 5.9 10*3/uL (ref 4.0–10.5)
nRBC: 0 % (ref 0.0–0.2)

## 2023-02-06 LAB — COMPREHENSIVE METABOLIC PANEL
ALT: 15 U/L (ref 0–44)
AST: 17 U/L (ref 15–41)
Albumin: 2.8 g/dL — ABNORMAL LOW (ref 3.5–5.0)
Alkaline Phosphatase: 147 U/L — ABNORMAL HIGH (ref 38–126)
Anion gap: 15 (ref 5–15)
BUN: 84 mg/dL — ABNORMAL HIGH (ref 6–20)
CO2: 18 mmol/L — ABNORMAL LOW (ref 22–32)
Calcium: 6.1 mg/dL — CL (ref 8.9–10.3)
Chloride: 102 mmol/L (ref 98–111)
Creatinine, Ser: 13.28 mg/dL — ABNORMAL HIGH (ref 0.61–1.24)
GFR, Estimated: 4 mL/min — ABNORMAL LOW (ref >60)
Glucose, Bld: 196 mg/dL — ABNORMAL HIGH (ref 70–99)
Potassium: 3.8 mmol/L (ref 3.5–5.1)
Sodium: 135 mmol/L (ref 135–145)
Total Bilirubin: 0.6 mg/dL (ref 0.3–1.2)
Total Protein: 6.8 g/dL (ref 6.5–8.1)

## 2023-02-06 LAB — GLUCOSE, CAPILLARY: Glucose-Capillary: 167 mg/dL — ABNORMAL HIGH (ref 70–99)

## 2023-02-06 MED ORDER — HEPARIN SODIUM (PORCINE) 1000 UNIT/ML DIALYSIS: 1000.0000 [IU] | INTRAMUSCULAR | Status: DC | PRN

## 2023-02-06 MED ORDER — HEPARIN SODIUM (PORCINE) 1000 UNIT/ML DIALYSIS: 2000.0000 [IU] | Freq: Once | INTRAMUSCULAR | Status: DC

## 2023-02-06 MED ORDER — LIDOCAINE HCL (PF) 1 % IJ SOLN: 5.0000 mL | INTRAMUSCULAR | Status: DC | PRN

## 2023-02-06 MED ORDER — ALTEPLASE 2 MG IJ SOLR: 2.0000 mg | Freq: Once | INTRAMUSCULAR | Status: DC | PRN

## 2023-02-06 MED ORDER — CHLORHEXIDINE GLUCONATE CLOTH 2 % EX PADS: 6.0000 | MEDICATED_PAD | Freq: Every day | CUTANEOUS | Status: DC

## 2023-02-06 MED ORDER — HEPARIN SODIUM (PORCINE) 1000 UNIT/ML DIALYSIS: 2000.0000 [IU] | INTRAMUSCULAR | Status: DC | PRN

## 2023-02-06 MED ORDER — LIDOCAINE-PRILOCAINE 2.5-2.5 % EX CREA: 1.0000 | TOPICAL_CREAM | CUTANEOUS | Status: DC | PRN

## 2023-02-06 MED ORDER — NEPRO/CARBSTEADY PO LIQD: 237.0000 mL | ORAL | Status: DC | PRN

## 2023-02-06 MED ORDER — ANTICOAGULANT SODIUM CITRATE 4% (200MG/5ML) IV SOLN: 5.0000 mL | Status: DC | PRN

## 2023-02-06 MED ORDER — AMLODIPINE BESYLATE 10 MG PO TABS
10.0000 mg | ORAL_TABLET | Freq: Every day | ORAL | Status: DC
  Administered 2023-02-07: 10 mg via ORAL
  Filled 2023-02-06: qty 1

## 2023-02-06 MED ORDER — PENTAFLUOROPROP-TETRAFLUOROETH EX AERO: 1.0000 | INHALATION_SPRAY | CUTANEOUS | Status: DC | PRN

## 2023-02-06 MED ORDER — HEPARIN SODIUM (PORCINE) 1000 UNIT/ML DIALYSIS
2000.0000 [IU] | Freq: Once | INTRAMUSCULAR | Status: DC
  Filled 2023-02-06 (×2): qty 2

## 2023-02-06 NOTE — ED Notes (Signed)
Pt refuse to let NT take CBG.

## 2023-02-06 NOTE — Progress Notes (Addendum)
Progress Note   Patient: [name]:[id number] DOB: [birth year] DOA: 02/04/2023     1 DOS: the patient was seen and examined on 02/06/2023   Brief hospital course: Mr. Whitsett was admitted to the hospital with the working diagnosis of heart failure exacerbation.   52 yo male with the past medical history of ESRD, heart failure, T2DM, hypertension and GERd who presented with worsening lower extremity edema. Last HD 02/01/23, four days prior to his hospitalization. Recent hospitalization 09/13 for volume overload, due to missing HD, he left on 09/14 against medical advice. Patient was note able to have HD as outpatient due to transportation difficulties.   09/19 Patient had HD yesterday and today for volume overload.   Assessment and Plan: * Volume overload ESRD. Hypocalcemia.   Renal function with serum K 3,8 and serum bicarbonate at 18, Na 135 and BUN 85.  Calcium 6.1   Patient had HD yesterday and today with improvement of his symptoms. Not yet back to baseline, continue renal replacement therapy inpatient until patient more euvolemic per nephrology recommendations.   Metabolic bone disease, continue with calcitriol and phoslo.  Chronic systolic CHF reduced EF 20 to 81% (congestive heart failure) (HCC) Reduced LV systolic function.   Plan to continue ultrafiltration for fluid management Systolic blood pressure 160 to 170 mmHg.   Diabetes mellitus type 2, insulin dependent (HCC) Continue insulin sliding scale for glucose cover and monitoring. Patient is tolerating po well.  Essential hypertension Continue blood pressure control with carvedilol and isosorbide. Uncontrolled hypertension will add amlodipine 10 mg daily for better blood pressure control.  Microcytic anemia Stable cell count.  Anemia of chronic renal disease.   GERD (gastroesophageal reflux disease) Continue PPI    Subjective: Patient with no chest pain, dyspnea has improved after HD but not back to  baseline   Physical Exam: Vitals:   02/06/23 1200 02/06/23 1230 02/06/23 1245 02/06/23 1558  BP: (S) (!) 233/128 (!) 235/199 (!) 164/78 (!) 177/88  Pulse: 89 90 89 91  Resp: 16 16 16 17   Temp:   (!) 97.3 F (36.3 C)   TempSrc:      SpO2: 100% 100% 100% 98%  Height:       Neurology awake and alert ENT with mild pallor Cardiovascular with S1 and S2 present and regular with no gallops, rubs or murmurs Positive lower extremity edema  Respiratory with basilar rales but with no wheezing, no rhonchi Abdomen with no distention  Data Reviewed:    Family Communication: no family at the bedside   Disposition: Status is: Inpatient Remains inpatient appropriate because: HD inpatient   Planned Discharge Destination: Home     Author: Coralie Keens, MD 02/06/2023 4:57 PM  For on call review [email or web address].

## 2023-02-06 NOTE — ED Notes (Addendum)
This RN attempted to pull blood off patient's IV for morning labs and was unsuccessful. Patient refusing to be stuck for morning labs at this time. MD notified.

## 2023-02-06 NOTE — Progress Notes (Signed)
Pt has refused all meds/care from this nurse and staff. He has refused to have labs drawn and refused care/vital signs,CHG bath, Blood glucose testing and more He has been educated and advised of the need to care and medications, as well as need for blood work, but he still refused. 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   MD Dow Adolph was notified  Pt has callbell/belongings within reach He has taken a shower twice since arriving to the unit, doesn't notify staff of getting in the shower in order to have IV site wrapped up to protect the site Pt is belligerent, condescending, and noncompliant with care.  I will continue to monitor for changes in status and will note changes accordingly

## 2023-02-06 NOTE — Progress Notes (Signed)
Date and time results received: 02/06/23 09:31 (use smartphrase ".now" to insert current time)  Test: calcium Critical Value: 6.1  Name of Provider Notified: Dr. Delano Metz  Orders Received? Or Actions Taken?: Dr. Reviewing pt chart  pt is stable with no issues or complaints

## 2023-02-06 NOTE — Progress Notes (Signed)
   02/06/23 1245  Vitals  Temp (!) 97.3 F (36.3 C)  Pulse Rate 89  Resp 16  BP (!) 164/78  SpO2 100 %  Post Treatment  Dialyzer Clearance Lightly streaked  Hemodialysis Intake (mL) 0 mL  Liters Processed 62.1  Fluid Removed (mL) 3000 mL  Tolerated HD Treatment Yes  AVG/AVF Arterial Site Held (minutes) 10 minutes  AVG/AVF Venous Site Held (minutes) 10 minutes   Received patient in bed to unit.  Alert and oriented.  Informed consent signed and in chart.   TX duration:3.5hrs  Patient tolerated well.  Transported back to the room  Alert, without acute distress.  Hand-off given to patient's nurse.   Access used: RAVG Access issues: none  Total UF removed: 3L Medication(s) given: none    [name]

## 2023-02-06 NOTE — ED Notes (Signed)
Pt refusing all meds, vitals, and blood sugar draws. Pt is A&O x4.

## 2023-02-06 NOTE — ED Notes (Signed)
Report given to dialysis nurse. This RN asked patient if he is okay being dialyzed and patient said yes.

## 2023-02-06 NOTE — ED Notes (Signed)
Tried to obtain v/s, pt refused.

## 2023-02-06 NOTE — ED Notes (Signed)
Pt refused to let NT take vitals

## 2023-02-06 NOTE — Progress Notes (Addendum)
[facility] Progress Note  Subjective: seen in HD unit. Looks like a different person. Awake and alert.   Vitals:   02/06/23 1130 02/06/23 1200 02/06/23 1230 02/06/23 1245  BP: (!) 212/91 (S) (!) 233/128 (!) 235/199 (!) 164/78  Pulse: 84 89 90 89  Resp: 14 16 16 16   Temp:    (!) 97.3 F (36.3 C)  TempSrc:      SpO2: 100% 100% 100% 100%  Height:        Exam: Gen alert today No jvd or bruits Chest clear bilat to bases RRR no RG Abd soft, nontender, +bs Ext 2+ bilat pretib edema, 1+ hip edema Neuro - alert today   RFA AVG+bruit      Renal-related home meds: - rocaltrol 0.25 mcg tts - phoslo 3 ac - coreg 25 bid - imdur 60 hs      OP HD: TTS [facility]  4h   400/800   61.5kg  2/2.5 bath  RFA AVG   Heparin 2000 - hep B labs done 9/13 - last OP HD 8/31, post wt 75kg - rocaltrol 0.25 mcg po three times per week - no esa, last Hb 12 on 8/31    CXR 9/17 - mild vasc congestion, no edema.      Assessment/ Plan: SOB/ volume overload - due to missing HD. 10- 20kg up by wts on admission. On RA. Sig bilat LE edema. Admit CXR w/o edema. Had 3 L UF w/ HD yest and another 3 L UF w/ HD today. See below.  HTN - BP's remain high pre HD today, better after HD.  ESRD - on HD TTS. Last OP HD was 8/31. HD yest and HD today. Will plan next HD for Saturday (was going to do daily HD but staffing won't allow).  HTN - BP's high, getting home meds coreg/ imdur here. Cont to lower volume down w/ HD.  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 Not on esa at OP unit. Follow.   MBD ckd - CCa very low, phos is a bit high. Resuming his po vdra and Ca++ containing binder (phoslo).  Started CaCO3 400mg  elemental tid yest --> will ^ to 800mg  elemental tid (four 500 mg tabs tid). Not symptomatic.  DM2 - on insulin         Rob Skylur Fuston MD  [facility] 02/06/2023, 1:18 PM  Recent Labs  Lab 01/31/23 1604 02/04/23 2125 02/05/23 0809 02/06/23 0824  HGB  --    < > 10.3* 10.3*  ALBUMIN 3.2*  --  2.9* 2.8*   CALCIUM  --    < > 5.7* 6.1*  PHOS 8.1*  --   --   --   CREATININE  --    < > 17.12* 13.28*  K  --    < > 4.5 3.8   < > = values in this interval not displayed.   Recent Labs  Lab 01/31/23 0430  IRON 96  TIBC 287  FERRITIN 241   Inpatient medications:  aspirin EC  81 mg Oral Daily   calcitRIOL  0.25 mcg Oral Q T,Th,Sat-1800   calcium acetate  2,001 mg Oral TID WC   calcium carbonate  400 mg of elemental calcium Oral TID   carvedilol  25 mg Oral BID WC   Chlorhexidine Gluconate Cloth  6 each Topical Q0600   Chlorhexidine Gluconate Cloth  6 each Topical Q0600   Chlorhexidine Gluconate Cloth  6 each Topical Q0600   heparin  5,000 Units  Subcutaneous Q8H   insulin aspart  0-6 Units Subcutaneous TID WC   insulin glargine-yfgn  7 Units Subcutaneous QHS   isosorbide mononitrate  60 mg Oral QHS   pantoprazole  40 mg Oral Daily   sodium chloride flush  3 mL Intravenous Q12H    sodium chloride     calcium gluconate     sodium chloride, acetaminophen **OR** acetaminophen, hydrALAZINE, senna-docusate, sodium chloride flush

## 2023-02-06 NOTE — Progress Notes (Signed)
Pt refused tele and diet orders MD notified.  He also refused to answer all his admission questions.  He stated all he wants is his food.  Patient had a shower.

## 2023-02-06 NOTE — ED Notes (Signed)
Going to dialysis

## 2023-02-06 NOTE — ED Notes (Signed)
ED TO INPATIENT HANDOFF REPORT  ED Nurse Name and Phone #: [phone]   S Name/Age/Gender [name] 52 y.o. male Room/Bed: 037C/037C  Code Status   Code Status: Full Code  Home/SNF/Other Home Patient oriented to: self, place, time, and situation Is this baseline? Yes   Triage Complete: Triage complete  Chief Complaint Volume overload [E87.70] Generalized edema due to fluid overload [E87.70, R60.1]  Triage Note Pt arrives to ED c/o bilateral leg edema x several days. Pt reports missing dialysis twice since Friday. Pt endorses SOB and no CP   Allergies Allergies  Allergen Reactions   Sulfa Antibiotics Rash    Severe rash.  Severe rash.     Severe rash.   Other Other (See Comments)    Patient states he is allergic to the TB test - unknown reaction during childhood    Bactrim [Sulfamethoxazole-Trimethoprim] Rash   Cephalexin Other (See Comments) and Rash    Patient with severe drug reaction including exfoliating skin rash and hypotension after being prescribed both cephalexin and sulfamethoxazole-trimethoprim simultaneously. Favor SMX as much more likely culprit as patient had tolerated other cephalosporins in the past, but cannot say with complete certainty that this was not related to cephalexin.  Other Reaction(s): Other, Other (See Comments)  Patient with severe drug reaction including exfoliating skin rash and hypotension after being prescribed both cephalexin and sulfamethoxazole-trimethoprim simultaneously. Favor SMX as much more likely culprit as patient had tolerated other cephalosporins in the past, but cannot say with complete certainty that this was not related to cephalexin.   "Patient with severe drug reaction including exfoliating skin rash and hypotension after being prescribed both cephalexin and sulfamethoxazole-trimethoprim simultaneously. Favor SMX as much more likely culprit as patient had tolerated other cephalosporins in the past, but cannot  say with complete certainty that this was not related to cephalexin." As noted in [facility]   "Patient with severe drug reaction including exfoliating skin rash and hypotension after being prescribed both cephalexin and sulfamethoxazole-trimethoprim simultaneously. Favor SMX as much more likely culprit as patient had tolerated other cephalosporins in the past, but cannot say with complete certainty that this was not related to cephalexin." As noted in [facility]   "Patient with severe drug reaction including exfoliating skin rash and hypotension after being prescribed both cephalexin and sulfamethoxazole-trimethoprim simultaneously. Favor SMX as much more likely culprit as patient had tolerated other cephalosporins in the past, but cannot say with complete certainty that this was not related to cephalexin." As noted in [facility]     Patient with severe drug reaction including exfoliating skin rash and hypotension after being prescribed both cephalexin and sulfamethoxazole-trimethoprim simultaneously. Favor SMX as much more likely culprit as patient had tolerated other cephalosporins in the past, but cannot say with complete certainty that this was not related to cephalexin.  "Patient with severe drug reaction including exfoliating skin rash and hypotension after being prescribed both cephalexin and sulfamethoxazole-trimethoprim simultaneously. Favor SMX as much more likely culprit as patient had tolerated other cephalosporins in the past, but cannot say with complete certainty that this was not related to cephalexin." As noted in [facility]  "Patient with severe drug reaction including exfoliating skin rash and hypotension after being prescribed both cephalexin and sulfamethoxazole-trimethoprim simultaneously. Favor SMX as much more likely culprit as patient had tolerated other cephalosporins in the past, but cannot say with complete certainty that this was not related to cephalexin." As noted in [facility]     Patient with severe  drug reaction inc... (TRUNCATED)    Level of Care/Admitting Diagnosis ED Disposition     ED Disposition  Admit   Condition  --   Comment  Hospital Area: [facility] [[id number]]  Level of Care: Telemetry Cardiac [[id number]]  May admit patient to [facility] or [facility] if equivalent level of care is available:: No  Covid Evaluation: Asymptomatic - no recent exposure (last 10 days) testing not required  Diagnosis: Volume overload [098119]  Admitting Physician: Tereasa Coop [[id number]]  Attending Physician: Tereasa Coop [[id number]]  Certification:: I certify this patient will need inpatient services for at least 2 midnights  Expected Medical Readiness: 02/08/2023          B Medical/Surgery History Past Medical History:  Diagnosis Date   Anxiety 02/2019   Anxiety 10/2019   Diabetes mellitus type II, uncontrolled 07/17/2006   Elevated alkaline phosphatase level 01/2019   ESRD on hemodialysis (HCC)    tts HD   ESSENTIAL HYPERTENSION 07/17/2006   Homelessness 10/13/2021   HYPERLIPIDEMIA 11/24/2008   Insomnia 02/2019   Left bundle branch block 02/22/2019   Lesion of lip 10/2019   Pneumonia 10/22/2011   Suicidal ideations 02/2019   Vitamin D deficiency 01/2019   Past Surgical History:  Procedure Laterality Date   ARM SURGERY     AV FISTULA PLACEMENT Right 02/20/2021   Procedure: RIGHT ARTERIOVENOUS GRAFT CREATION;  Surgeon: Chuck Hint, MD;  Location: [facility] OR;  Service: Vascular;  Laterality: Right;   INSERTION OF DIALYSIS CATHETER Left 02/20/2021   Procedure: INSERTION OF DIALYSIS CATHETER USING PALINDROME CATHETER;  Surgeon: Chuck Hint, MD;  Location: [facility] OR;  Service: Vascular;  Laterality: Left;   IR AV DIALY SHUNT INTRO NEEDLE/INTRACATH INITIAL W/PTA/IMG RIGHT Right 11/06/2021   IR FLUORO GUIDE CV LINE RIGHT  09/12/2022   IR REMOVAL TUN CV CATH W/O FL  07/27/2021   IR THROMBECTOMY AV FISTULA W/THROMBOLYSIS  INC/SHUNT/IMG RIGHT  09/11/2022   IR US GUIDE VASC ACCESS RIGHT  11/06/2021   IR US GUIDE VASC ACCESS RIGHT  09/11/2022   IR US GUIDE VASC ACCESS RIGHT  09/12/2022   TEE WITHOUT CARDIOVERSION N/A 10/25/2022   Procedure: TRANSESOPHAGEAL ECHOCARDIOGRAM;  Surgeon: Chilton Si, MD;  Location: [facility] INVASIVE CV LAB;  Service: Cardiovascular;  Laterality: N/A;   ULTRASOUND GUIDANCE FOR VASCULAR ACCESS  02/20/2021   Procedure: ULTRASOUND GUIDANCE FOR VASCULAR ACCESS;  Surgeon: Chuck Hint, MD;  Location: [facility] OR;  Service: Vascular;;     A IV Location/Drains/Wounds Patient Lines/Drains/Airways Status     Active Line/Drains/Airways     Name Placement date Placement time Site Days   Peripheral IV 02/05/23 22 G 1.75" Anterior;Left Forearm 02/05/23  1346  Forearm  1   Fistula / Graft Right Forearm --  --  Forearm  --   Wound / Incision (Open or Dehisced) 09/12/22 Skin tear Elbow Left 09/12/22  --  Elbow  147   Wound / Incision (Open or Dehisced) 01/31/23 Other (Comment) Knee Anterior;Right round open area 01/31/23  1300  Knee  6   Wound / Incision (Open or Dehisced) 01/31/23 Other (Comment) Knee Anterior;Left round open area 01/31/23  1300  Knee  6            Intake/Output Last 24 hours  Intake/Output Summary (Last 24 hours) at 02/06/2023 1603 Last data filed at 02/06/2023 1245 Gross per 24 hour  Intake --  Output 3000 ml  Net -3000 ml    Labs/Imaging Results for orders  placed or performed during the hospital encounter of 02/04/23 (from the past 48 hour(s))  CBC     Status: Abnormal   Collection Time: 02/04/23  9:25 PM  Result Value Ref Range   WBC 8.6 4.0 - 10.5 K/uL   RBC 4.65 4.22 - 5.81 MIL/uL   Hemoglobin 11.2 (L) 13.0 - 17.0 g/dL   HCT 02.7 (L) 25.3 - 66.4 %   MCV 78.5 (L) 80.0 - 100.0 fL   MCH 24.1 (L) 26.0 - 34.0 pg   MCHC 30.7 30.0 - 36.0 g/dL   RDW 40.3 (H) 47.4 - 25.9 %   Platelets 237 150 - 400 K/uL   nRBC 0.0 0.0 - 0.2 %    Comment: Performed at [facility], [address] [place], [place] [address]  Troponin I (High Sensitivity)     Status: Abnormal   Collection Time: 02/04/23  9:25 PM  Result Value Ref Range   Troponin I (High Sensitivity) 234 (HH) <18 ng/L    Comment: CRITICAL RESULT CALLED TO, READ BACK BY AND VERIFIED WITH E. SHASSER, RN AT 22:15 09.17.24 JLASIGAN (NOTE) Elevated high sensitivity troponin I (hsTnI) values and significant  changes across serial measurements may suggest ACS but many other  chronic and acute conditions are known to elevate hsTnI results.  Refer to the "Links" section for chest pain algorithms and additional  guidance. Performed at [facility], [address], [place], [place] [address]   Brain natriuretic peptide     Status: Abnormal   Collection Time: 02/04/23  9:25 PM  Result Value Ref Range   B Natriuretic Peptide >4,500.0 (H) 0.0 - 100.0 pg/mL    Comment: Performed at [facility], [address] [place], [place] [address]  Basic metabolic panel     Status: Abnormal   Collection Time: 02/04/23  9:25 PM  Result Value Ref Range   Sodium 136 135 - 145 mmol/L   Potassium 4.4 3.5 - 5.1 mmol/L   Chloride 102 98 - 111 mmol/L   CO2 15 (L) 22 - 32 mmol/L   Glucose, Bld 127 (H) 70 - 99 mg/dL    Comment: Glucose reference range applies only to samples taken after fasting for at least 8 hours.   BUN 115 (H) 6 - 20 mg/dL   Creatinine, Ser 84.16 (H) 0.61 - 1.24 mg/dL   Calcium 5.7 (LL) 8.9 - 10.3 mg/dL    Comment: CRITICAL RESULT CALLED TO, READ BACK BY AND VERIFIED WITH E. SHASSER, RN AT 22:15 09.17.24 JLASIGAN   GFR, Estimated 3 (L) >60 mL/min    Comment: (NOTE) Calculated using the CKD-EPI Creatinine Equation (2021)    Anion gap 19 (H) 5 - 15    Comment: Performed at [facility], [address], [place], [place] [address]  Troponin I (High Sensitivity)     Status: Abnormal   Collection Time: 02/04/23 11:58 PM  Result Value Ref Range   Troponin I (High Sensitivity) 234  (HH) <18 ng/L    Comment: CRITICAL VALUE NOTED. VALUE IS CONSISTENT WITH PREVIOUSLY REPORTED/CALLED VALUE (NOTE) Elevated high sensitivity troponin I (hsTnI) values and significant  changes across serial measurements may suggest ACS but many other  chronic and acute conditions are known to elevate hsTnI results.  Refer to the "Links" section for chest pain algorithms and additional  guidance. Performed at [facility], [address], [place], [place] [address]   Comprehensive metabolic panel  Status: Abnormal   Collection Time: 02/05/23  8:09 AM  Result Value Ref Range   Sodium 138 135 - 145 mmol/L   Potassium 4.5 3.5 - 5.1 mmol/L   Chloride 102 98 - 111 mmol/L   CO2 15 (L) 22 - 32 mmol/L   Glucose, Bld 99 70 - 99 mg/dL    Comment: Glucose reference range applies only to samples taken after fasting for at least 8 hours.   BUN 118 (H) 6 - 20 mg/dL   Creatinine, Ser 16.10 (H) 0.61 - 1.24 mg/dL   Calcium 5.7 (LL) 8.9 - 10.3 mg/dL    Comment: CRITICAL RESULT CALLED TO, READ BACK BY AND VERIFIED WITH ACENA,R RN @ [phone] 02/05/23 LEONARD,A   Total Protein 6.9 6.5 - 8.1 g/dL   Albumin 2.9 (L) 3.5 - 5.0 g/dL   AST 19 15 - 41 U/L   ALT 17 0 - 44 U/L   Alkaline Phosphatase 170 (H) 38 - 126 U/L   Total Bilirubin 0.6 0.3 - 1.2 mg/dL   GFR, Estimated 3 (L) >60 mL/min    Comment: (NOTE) Calculated using the CKD-EPI Creatinine Equation (2021)    Anion gap 21 (H) 5 - 15    Comment: ELECTROLYTES REPEATED TO VERIFY Performed at [facility], [address], [place], [place] [address]   CBC     Status: Abnormal   Collection Time: 02/05/23  8:09 AM  Result Value Ref Range   WBC 6.9 4.0 - 10.5 K/uL   RBC 4.17 (L) 4.22 - 5.81 MIL/uL   Hemoglobin 10.3 (L) 13.0 - 17.0 g/dL   HCT 11.9 (L) 14.7 - 82.9 %   MCV 78.4 (L) 80.0 - 100.0 fL   MCH 24.7 (L) 26.0 - 34.0 pg   MCHC 31.5 30.0 - 36.0 g/dL   RDW 56.2 (H) 13.0 - 86.5 %   Platelets 241 150 - 400 K/uL   nRBC 0.0 0.0 - 0.2 %     Comment: Performed at [facility], [address], [place], [place] [address]  Troponin I (High Sensitivity)     Status: Abnormal   Collection Time: 02/05/23  8:09 AM  Result Value Ref Range   Troponin I (High Sensitivity) 259 (HH) <18 ng/L    Comment: CRITICAL RESULT CALLED TO, READ BACK BY AND VERIFIED WITH ACENA,R RN @ [phone] 02/05/23 LEONARD,A (NOTE) Elevated high sensitivity troponin I (hsTnI) values and significant  changes across serial measurements may suggest ACS but many other  chronic and acute conditions are known to elevate hsTnI results.  Refer to the "Links" section for chest pain algorithms and additional  guidance. Performed at [facility], [address] [place], [place] [address]   CBG monitoring, ED     Status: Abnormal   Collection Time: 02/05/23  9:40 PM  Result Value Ref Range   Glucose-Capillary 171 (H) 70 - 99 mg/dL    Comment: Glucose reference range applies only to samples taken after fasting for at least 8 hours.   Comment 1 Notify RN    Comment 2 Document in Chart   Comprehensive metabolic panel     Status: Abnormal   Collection Time: 02/06/23  8:24 AM  Result Value Ref Range   Sodium 135 135 - 145 mmol/L   Potassium 3.8 3.5 - 5.1 mmol/L   Chloride 102 98 - 111 mmol/L   CO2 18 (L) 22 - 32 mmol/L   Glucose, Bld 196 (H) 70 - 99 mg/dL  Comment: Glucose reference range applies only to samples taken after fasting for at least 8 hours.   BUN 84 (H) 6 - 20 mg/dL   Creatinine, Ser 60.45 (H) 0.61 - 1.24 mg/dL   Calcium 6.1 (LL) 8.9 - 10.3 mg/dL    Comment: CRITICAL RESULT CALLED TO, READ BACK BY AND VERIFIED WITH [place],C RN @ 0932 02/06/23 LEONARD,A   Total Protein 6.8 6.5 - 8.1 g/dL   Albumin 2.8 (L) 3.5 - 5.0 g/dL   AST 17 15 - 41 U/L   ALT 15 0 - 44 U/L   Alkaline Phosphatase 147 (H) 38 - 126 U/L   Total Bilirubin 0.6 0.3 - 1.2 mg/dL   GFR, Estimated 4 (L) >60 mL/min    Comment: (NOTE) Calculated using the CKD-EPI Creatinine Equation  (2021)    Anion gap 15 5 - 15    Comment: Performed at [facility], [address] [place], [place] [address]  CBC     Status: Abnormal   Collection Time: 02/06/23  8:24 AM  Result Value Ref Range   WBC 5.9 4.0 - 10.5 K/uL   RBC 4.14 (L) 4.22 - 5.81 MIL/uL   Hemoglobin 10.3 (L) 13.0 - 17.0 g/dL   HCT 19.1 (L) 47.8 - 29.5 %   MCV 78.0 (L) 80.0 - 100.0 fL   MCH 24.9 (L) 26.0 - 34.0 pg   MCHC 31.9 30.0 - 36.0 g/dL   RDW 62.1 (H) 30.8 - 65.7 %   Platelets 250 150 - 400 K/uL   nRBC 0.0 0.0 - 0.2 %    Comment: Performed at [facility], [address] [place], [place] [address]   DG Chest 2 View  Result Date: 02/04/2023 CLINICAL DATA:  Short of breath EXAM: CHEST - 2 VIEW COMPARISON:  01/30/2023 FINDINGS: Frontal and lateral views of the chest demonstrates stable enlargement of the cardiac silhouette. Persistent central vascular congestion without airspace disease, effusion, or pneumothorax. No acute bony abnormality. IMPRESSION: 1. Stable enlarged cardiac silhouette and pulmonary vascular congestion. No overt edema. Electronically Signed   By: Sharlet Salina M.D.   On: 02/04/2023 22:08    Pending Labs Unresulted Labs (From admission, onward)     Start     Ordered   02/06/23 1329  Hepatitis B surface antigen  (New Admission Hemo Labs (Hepatitis B))  Once,   R        02/06/23 1329   02/06/23 1329  Hepatitis B surface antibody,quantitative  (New Admission Hemo Labs (Hepatitis B))  Once,   R        02/06/23 1329   02/05/23 0500  Comprehensive metabolic panel  Daily,   R      02/05/23 0415   02/05/23 0500  CBC  Daily,   R      02/05/23 0415            Vitals/Pain Today's Vitals   02/06/23 1200 02/06/23 1230 02/06/23 1245 02/06/23 1558  BP: (S) (!) 233/128 (!) 235/199 (!) 164/78 (!) 177/88  Pulse: 89 90 89 91  Resp: 16 16 16 17   Temp:   (!) 97.3 F (36.3 C)   TempSrc:      SpO2: 100% 100% 100% 98%  Height:      PainSc:        Isolation Precautions No  active isolations  Medications Medications  aspirin EC tablet 81 mg (81 mg Oral Not Given 02/05/23 1532)  carvedilol (COREG) tablet 25 mg (25 mg  Oral Patient Refused/Not Given 02/05/23 1716)  isosorbide mononitrate (IMDUR) 24 hr tablet 60 mg (60 mg Oral Not Given 02/05/23 2204)  calcitRIOL (ROCALTROL) capsule 0.25 mcg (has no administration in time range)  insulin glargine-yfgn (SEMGLEE) injection 7 Units (7 Units Subcutaneous Not Given 02/05/23 2205)  calcium acetate (PHOSLO) capsule 2,001 mg (2,001 mg Oral Patient Refused/Not Given 02/05/23 1715)  pantoprazole (PROTONIX) EC tablet 40 mg (40 mg Oral Patient Refused/Not Given 02/05/23 1533)  heparin injection 5,000 Units (5,000 Units Subcutaneous Not Given 02/06/23 6213)  sodium chloride flush (NS) 0.9 % injection 3 mL (3 mLs Intravenous Given 02/05/23 2205)  sodium chloride flush (NS) 0.9 % injection 3 mL (has no administration in time range)  0.9 %  sodium chloride infusion (has no administration in time range)  acetaminophen (TYLENOL) tablet 650 mg (has no administration in time range)    Or  acetaminophen (TYLENOL) suppository 650 mg (has no administration in time range)  senna-docusate (Senokot-S) tablet 1 tablet (has no administration in time range)  calcium gluconate 2 g/ 100 mL sodium chloride IVPB (2,000 mg Intravenous Not Given 02/05/23 1531)  hydrALAZINE (APRESOLINE) injection 10 mg (10 mg Intravenous Given 02/05/23 1124)  calcium carbonate (TUMS - dosed in mg elemental calcium) chewable tablet 400 mg of elemental calcium (400 mg of elemental calcium Oral Not Given 02/05/23 2205)  insulin aspart (novoLOG) injection 0-6 Units ( Subcutaneous Not Given 02/06/23 0746)  Chlorhexidine Gluconate Cloth 2 % PADS 6 each (has no administration in time range)    Mobility walks     Focused Assessments    R Recommendations: See Admitting Provider Note  Report given to:   Additional Notes:

## 2023-02-06 NOTE — ED Notes (Signed)
Pt refused CBG testing at 1600

## 2023-02-07 DIAGNOSIS — Z992 Dependence on renal dialysis: Secondary | ICD-10-CM | POA: Diagnosis not present

## 2023-02-07 DIAGNOSIS — N186 End stage renal disease: Secondary | ICD-10-CM | POA: Diagnosis not present

## 2023-02-07 DIAGNOSIS — I5022 Chronic systolic (congestive) heart failure: Secondary | ICD-10-CM | POA: Diagnosis not present

## 2023-02-07 DIAGNOSIS — E8779 Other fluid overload: Secondary | ICD-10-CM | POA: Diagnosis not present

## 2023-02-07 DIAGNOSIS — Z794 Long term (current) use of insulin: Secondary | ICD-10-CM | POA: Diagnosis not present

## 2023-02-07 DIAGNOSIS — E119 Type 2 diabetes mellitus without complications: Secondary | ICD-10-CM | POA: Diagnosis not present

## 2023-02-07 DIAGNOSIS — K219 Gastro-esophageal reflux disease without esophagitis: Secondary | ICD-10-CM | POA: Diagnosis not present

## 2023-02-07 DIAGNOSIS — E8770 Fluid overload, unspecified: Secondary | ICD-10-CM | POA: Diagnosis not present

## 2023-02-07 DIAGNOSIS — D509 Iron deficiency anemia, unspecified: Secondary | ICD-10-CM | POA: Diagnosis not present

## 2023-02-07 DIAGNOSIS — I10 Essential (primary) hypertension: Secondary | ICD-10-CM | POA: Diagnosis not present

## 2023-02-07 LAB — GLUCOSE, CAPILLARY
Glucose-Capillary: 155 mg/dL — ABNORMAL HIGH (ref 70–99)
Glucose-Capillary: 159 mg/dL — ABNORMAL HIGH (ref 70–99)
Glucose-Capillary: 128 mg/dL — ABNORMAL HIGH (ref 70–99)
Glucose-Capillary: 160 mg/dL — ABNORMAL HIGH (ref 70–99)

## 2023-02-07 MED ORDER — CARVEDILOL 12.5 MG PO TABS
12.5000 mg | ORAL_TABLET | Freq: Two times a day (BID) | ORAL | Status: DC
  Administered 2023-02-09 – 2023-02-13 (×6): 12.5 mg via ORAL
  Filled 2023-02-07 (×10): qty 1

## 2023-02-07 MED ORDER — CHLORHEXIDINE GLUCONATE CLOTH 2 % EX PADS: 6.0000 | MEDICATED_PAD | Freq: Every day | CUTANEOUS | Status: DC

## 2023-02-07 NOTE — Plan of Care (Signed)

## 2023-02-07 NOTE — Progress Notes (Signed)
Pt. Refused to have standing weight taken, pt. Encouraged numerous times and educated why daily weight measurements are necessary, pt. Verbalized understanding but still refuses, Dr. Arlean Hopping made aware

## 2023-02-07 NOTE — Progress Notes (Signed)
[facility] Progress Note  Subjective: seen in room, no c/o's.   Vitals:   02/06/23 1558 02/06/23 1808 02/07/23 0051 02/07/23 1003  BP: (!) 177/88 (!) 143/77 122/77 138/68  Pulse: 91   80  Resp: 17 18 17 16   Temp:    97.8 F (36.6 C)  TempSrc:  Oral  Oral  SpO2: 98% 99%  96%  Height:        Exam: Gen alert today No jvd or bruits Chest clear bilat to bases RRR no RG Abd soft, nontender, +bs Ext 2+ bilat pretib edema, 1+ hip edema Neuro - alert today   RFA AVG+bruit      Renal-related home meds: - rocaltrol 0.25 mcg tts - phoslo 3 ac - coreg 25 bid - imdur 60 hs      OP HD: TTS [facility]  4h   400/800   61.5kg  2/2.5 bath  RFA AVG   Heparin 2000 - hep B labs done 9/13 - last OP HD 8/31, post wt 75kg - rocaltrol 0.25 mcg po three times per week - no esa, last Hb 12 on 8/31    CXR 9/17 - mild vasc congestion, no edema.      Assessment/ Plan: SOB/ volume overload - due to missing HD. 10- 20kg up by wts on admission. On RA. Sig bilat LE edema. Admit CXR w/o edema. Has had HD here x 2 w/ 6 L UF total. Next HD tomorrow.  ESRD - on HD TTS. Last OP HD was 8/31. HD here Wed and Thursday. Next HD Sat.  HTN - BP's high, getting home meds coreg/ imdur here. Cont to lower volume down w/ HD.  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 Not on esa at OP unit. Follow.   MBD ckd - CCa very low, phos is a bit high. Resuming his po vdra and Ca++ containing binder (phoslo).  Started CaCO3 400mg  elemental tid yest --> will ^ to 800mg  elemental tid (four 500 mg tabs tid). Not symptomatic.  DM2 - on insulin         Rob Azion Centrella MD  [facility] 02/07/2023, 10:52 AM  Recent Labs  Lab 01/31/23 1604 02/04/23 2125 02/05/23 0809 02/06/23 0824  HGB  --    < > 10.3* 10.3*  ALBUMIN 3.2*  --  2.9* 2.8*  CALCIUM  --    < > 5.7* 6.1*  PHOS 8.1*  --   --   --   CREATININE  --    < > 17.12* 13.28*  K  --    < > 4.5 3.8   < > = values in this interval not displayed.   No results for input(s):  "IRON", "TIBC", "FERRITIN" in the last 168 hours.  Inpatient medications:  amLODipine  10 mg Oral Daily   aspirin EC  81 mg Oral Daily   calcitRIOL  0.25 mcg Oral Q T,Th,Sat-1800   calcium acetate  2,001 mg Oral TID WC   calcium carbonate  400 mg of elemental calcium Oral TID   carvedilol  25 mg Oral BID WC   Chlorhexidine Gluconate Cloth  6 each Topical Q0600   heparin  2,000 Units Dialysis Once in dialysis   heparin  5,000 Units Subcutaneous Q8H   insulin aspart  0-6 Units Subcutaneous TID WC   insulin glargine-yfgn  7 Units Subcutaneous QHS   isosorbide mononitrate  60 mg Oral QHS   pantoprazole  40 mg Oral Daily   sodium chloride flush  3  mL Intravenous Q12H    sodium chloride     anticoagulant sodium citrate     calcium gluconate     sodium chloride, acetaminophen **OR** acetaminophen, alteplase, anticoagulant sodium citrate, feeding supplement (NEPRO CARB STEADY), heparin, heparin, hydrALAZINE, lidocaine (PF), lidocaine-prilocaine, pentafluoroprop-tetrafluoroeth, senna-docusate, sodium chloride flush

## 2023-02-07 NOTE — Progress Notes (Signed)
Progress Note   Patient: [name]:[id number] DOB: November 05, 1970 DOA: 02/04/2023     2 DOS: the patient was seen and examined on 02/07/2023   Brief hospital course: Mr. Simonetti was admitted to the hospital with the working diagnosis of heart failure exacerbation.   52 yo male with the past medical history of ESRD, heart failure, T2DM, hypertension and GERd who presented with worsening lower extremity edema. Last HD 02/01/23, four days prior to his hospitalization. Recent hospitalization 09/13 for volume overload, due to missing HD, he left on 09/14 against medical advice. Patient was note able to have HD as outpatient due to transportation difficulties.   09/19 Patient had HD yesterday and today for volume overload.   Assessment and Plan: * Volume overload ESRD. Hypocalcemia.   Declined blood work this morning.   Had HD 09/18, 09/19 and 09/20    Not yet back to baseline, continue renal replacement therapy inpatient until patient more euvolemic per nephrology recommendations.   Metabolic bone disease, continue with calcitriol and phoslo.  Chronic systolic CHF reduced EF 20 to 81% (congestive heart failure) (HCC) Reduced LV systolic function.  Systolic blood pressure 122 to 138 mmHg.  Will place holding parameters to carvedilol to prevent hypotension.   Fluid management per ultrafiltration.   Diabetes mellitus type 2, insulin dependent (HCC) Continue insulin sliding scale for glucose cover and monitoring. Basal insulin 7 units Capillary glucose has been 155 and 159 mg/dl.  Patient is tolerating po well.  Essential hypertension Blood pressure better controlled after fluid removal.  Plan to continue carvedilol (reduce dose to 12,5 mg bid) with holding parameters and will continue with amlodipine.   Microcytic anemia Stable cell count.  Anemia of chronic renal disease.   GERD (gastroesophageal reflux disease) Continue PPI        Subjective: Patient had HD today,  he has been refusing to be weight today. On direct questioning no dyspnea or chest pain   Physical Exam: Vitals:   02/06/23 1808 02/07/23 0051 02/07/23 1003 02/07/23 1634  BP: (!) 143/77 122/77 138/68   Pulse:   80 80  Resp: 18 17 16 18   Temp:   97.8 F (36.6 C) 98 F (36.7 C)  TempSrc: Oral  Oral Oral  SpO2: 99%  96% 95%  Height:       Neurology, eyes closed at the time of my examination  ENT with no pallor Cardiovascular with S1 and S2 present and regular with no gallops Respiratory with no rales or rhonchi Abdomen with no distention  Positive lower extremity edema ++  Data Reviewed:    Family Communication: no family at the bedside   Disposition: Status is: Inpatient Remains inpatient appropriate because: inpatient renal replacement therapy   Planned Discharge Destination: Home    Author: Coralie Keens, MD 02/07/2023 4:38 PM  For on call review [email or web address].
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
## 2023-02-08 DIAGNOSIS — I5022 Chronic systolic (congestive) heart failure: Secondary | ICD-10-CM | POA: Diagnosis not present

## 2023-02-08 DIAGNOSIS — E8779 Other fluid overload: Secondary | ICD-10-CM | POA: Diagnosis not present

## 2023-02-08 DIAGNOSIS — E8770 Fluid overload, unspecified: Secondary | ICD-10-CM | POA: Diagnosis not present

## 2023-02-08 DIAGNOSIS — Z794 Long term (current) use of insulin: Secondary | ICD-10-CM | POA: Diagnosis not present

## 2023-02-08 DIAGNOSIS — I10 Essential (primary) hypertension: Secondary | ICD-10-CM | POA: Diagnosis not present

## 2023-02-08 DIAGNOSIS — Z992 Dependence on renal dialysis: Secondary | ICD-10-CM | POA: Diagnosis not present

## 2023-02-08 DIAGNOSIS — N186 End stage renal disease: Secondary | ICD-10-CM | POA: Diagnosis not present

## 2023-02-08 DIAGNOSIS — D509 Iron deficiency anemia, unspecified: Secondary | ICD-10-CM | POA: Diagnosis not present

## 2023-02-08 DIAGNOSIS — E119 Type 2 diabetes mellitus without complications: Secondary | ICD-10-CM | POA: Diagnosis not present

## 2023-02-08 DIAGNOSIS — K219 Gastro-esophageal reflux disease without esophagitis: Secondary | ICD-10-CM | POA: Diagnosis not present

## 2023-02-08 LAB — RENAL FUNCTION PANEL
Albumin: 2.9 g/dL — ABNORMAL LOW (ref 3.5–5.0)
Anion gap: 12 (ref 5–15)
BUN: 67 mg/dL — ABNORMAL HIGH (ref 6–20)
CO2: 22 mmol/L (ref 22–32)
Calcium: 6.5 mg/dL — ABNORMAL LOW (ref 8.9–10.3)
Chloride: 101 mmol/L (ref 98–111)
Creatinine, Ser: 11.35 mg/dL — ABNORMAL HIGH (ref 0.61–1.24)
GFR, Estimated: 5 mL/min — ABNORMAL LOW (ref >60)
Glucose, Bld: 116 mg/dL — ABNORMAL HIGH (ref 70–99)
Phosphorus: 4.7 mg/dL — ABNORMAL HIGH (ref 2.5–4.6)
Potassium: 3.7 mmol/L (ref 3.5–5.1)
Sodium: 135 mmol/L (ref 135–145)

## 2023-02-08 LAB — GLUCOSE, CAPILLARY
Glucose-Capillary: 89 mg/dL (ref 70–99)
Glucose-Capillary: 148 mg/dL — ABNORMAL HIGH (ref 70–99)

## 2023-02-08 MED ORDER — HEPARIN SODIUM (PORCINE) 1000 UNIT/ML DIALYSIS
2000.0000 [IU] | Freq: Once | INTRAMUSCULAR | Status: AC
  Administered 2023-02-12: 2000 [IU] via INTRAVENOUS_CENTRAL
  Filled 2023-02-08: qty 2

## 2023-02-08 MED ORDER — CALCIUM CARBONATE ANTACID 500 MG PO CHEW
800.0000 mg | CHEWABLE_TABLET | Freq: Three times a day (TID) | ORAL | Status: DC
  Administered 2023-02-09 – 2023-02-13 (×8): 800 mg via ORAL
  Filled 2023-02-08 (×13): qty 4

## 2023-02-08 MED ORDER — ISOSORBIDE MONONITRATE ER 30 MG PO TB24
30.0000 mg | ORAL_TABLET | Freq: Every day | ORAL | Status: DC
  Filled 2023-02-08: qty 1

## 2023-02-08 MED ORDER — PENTAFLUOROPROP-TETRAFLUOROETH EX AERO: 1.0000 | INHALATION_SPRAY | CUTANEOUS | Status: DC | PRN

## 2023-02-08 NOTE — Progress Notes (Signed)
Pre-hand off Morning RN informed staff pt had refuse HD this AM. RN asked PT again for HD and still Refused.

## 2023-02-08 NOTE — Progress Notes (Signed)
[facility] Progress Note  Subjective: no c/o's today   Vitals:   02/07/23 1003 02/07/23 1634 02/07/23 2247 02/08/23 0950  BP: 138/68 96/62 123/72 115/64  Pulse: 80 80 76 74  Resp: 16 18 16 18   Temp: 97.8 F (36.6 C) 98 F (36.7 C) 98.8 F (37.1 C) 98.2 F (36.8 C)  TempSrc: Oral Oral  Oral  SpO2: 96% 95% (!) 89% 92%  Weight:  78.4 kg    Height:        Exam: Gen alert today No jvd or bruits Chest clear bilat to bases RRR no RG Abd soft, nontender, +bs Ext 1-2+ bilat pretib edema, 1+ hip edema Neuro - alert today   RFA AVG+bruit      Renal-related home meds: - rocaltrol 0.25 mcg tts - phoslo 3 ac - coreg 25 bid - imdur 60 hs      OP HD: TTS [facility]  4h   400/800   61.5kg  2/2.5 bath  RFA AVG   Heparin 2000 - hep B labs done 9/13 - last OP HD 8/31, post wt 75kg - rocaltrol 0.25 mcg po three times per week - no esa, last Hb 12 on 8/31    CXR 9/17 - mild vasc congestion, no edema.      Assessment/ Plan: SOB/ chronic volume overload - due to missing HD. Sig bilat LE edema on admission. Admit CXR w/o edema. Has had HD here x 2 w/ 6 L UF total. Next HD today, cont to lower volume.  ESRD - on HD TTS. Last OP HD was 8/31. HD here Wed and Thursday. Next HD today.  HTN - BP's normal to low now, would consider holding or lowering coreg/ imdur. Will d/w pmd.  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 Not on esa at OP unit. Follow.   MBD ckd - CCa was very low (CCa 6.5 at lowest here) on admission, phos was a bit high. Resumed his po vdra and Ca++ containing binder (phoslo).  Started CaCO3 400mg  elemental tid (taking two 500mg  tums tid)--> Ca++ levels up a bit to 6.5 today (CCa 7.3). Not symptomatic. Continue po vdra and Tums 500mg  2 tid.  DM2 - on insulin         Rob Mj Willis MD  [facility] 02/08/2023, 11:59 AM  Recent Labs  Lab 02/05/23 0809 02/06/23 0824 02/08/23 0958  HGB 10.3* 10.3*  --   ALBUMIN 2.9* 2.8* 2.9*  CALCIUM 5.7* 6.1* 6.5*  PHOS  --   --  4.7*   CREATININE 17.12* 13.28* 11.35*  K 4.5 3.8 3.7   No results for input(s): "IRON", "TIBC", "FERRITIN" in the last 168 hours.  Inpatient medications:  amLODipine  10 mg Oral Daily   aspirin EC  81 mg Oral Daily   calcitRIOL  0.25 mcg Oral Q T,Th,Sat-1800   calcium acetate  2,001 mg Oral TID WC   calcium carbonate  400 mg of elemental calcium Oral TID   carvedilol  12.5 mg Oral BID WC   Chlorhexidine Gluconate Cloth  6 each Topical Q0600   heparin  2,000 Units Dialysis Once in dialysis   [START ON 02/09/2023] heparin  2,000 Units Dialysis Once in dialysis   heparin  5,000 Units Subcutaneous Q8H   insulin aspart  0-6 Units Subcutaneous TID WC   insulin glargine-yfgn  7 Units Subcutaneous QHS   isosorbide mononitrate  60 mg Oral QHS   pantoprazole  40 mg Oral Daily   sodium chloride flush  3 mL Intravenous Q12H    sodium chloride     anticoagulant sodium citrate     sodium chloride, acetaminophen **OR** acetaminophen, alteplase, anticoagulant sodium citrate, feeding supplement (NEPRO CARB STEADY), heparin, heparin, lidocaine (PF), lidocaine-prilocaine, pentafluoroprop-tetrafluoroeth, senna-docusate, sodium chloride flush

## 2023-02-08 NOTE — Progress Notes (Addendum)
Progress Note   Patient: [name] YQM:[id number] DOB: March 03, 1971 DOA: 02/04/2023     3 DOS: the patient was seen and examined on 02/08/2023   Brief hospital course: [name] was admitted to the hospital with the working diagnosis of heart failure exacerbation.   52 yo male with the past medical history of ESRD, heart failure, T2DM, hypertension and GERd who presented with worsening lower extremity edema. Last HD 02/01/23, four days prior to his hospitalization. Recent hospitalization 09/13 for volume overload, due to missing HD, he left on 09/14 against medical advice. Patient was note able to have HD as outpatient due to transportation difficulties.   Patient has been treated with intense consecutive days of hemodialysis with ultrafiltration.   09/18, 09/19 HD/ UF with good toleration, 09/21 HD/UF.    Assessment and Plan: * ESRD on dialysis (HCC) Hypervolemia. Hypocalcemia.   Today BUN is 67, Na 135 and K at 3,7, serum bicarbonate at 22.  Plan for HD today with further UF.   Metabolic bone disease, continue with calcitriol and phoslo.  Chronic systolic CHF reduced EF 20 to 52% (congestive heart failure) (HCC) Reduced LV systolic function.  Systolic blood pressure 123 to 115 mmHg.  To allow better ultrafiltration, patient will need a higher blood pressure. Plan to reduce isosorbide dose and discontinue amlodipine.  Continue carvedilol for now, to avoid rebound tachycardia.   Fluid management per ultrafiltration.   Diabetes mellitus type 2, insulin dependent (HCC) Continue insulin sliding scale for glucose cover and monitoring. Basal insulin 7 units serum glucose today 116 mg/dl.   Essential hypertension Plan to continue carvedilol, decrease dose of isosorbide and discontinue amlodipine.   Microcytic anemia Stable cell count.  Anemia of chronic renal disease.   GERD (gastroesophageal reflux disease) Continue PPI        Subjective: Patient sleeping at the  time of my examination, no chest pain or dyspnea   Physical Exam: Vitals:   02/07/23 1003 02/07/23 1634 02/07/23 2247 02/08/23 0950  BP: 138/68 96/62 123/72 115/64  Pulse: 80 80 76 74  Resp: 16 18 16 18   Temp: 97.8 F (36.6 C) 98 F (36.7 C) 98.8 F (37.1 C) 98.2 F (36.8 C)  TempSrc: Oral Oral  Oral  SpO2: 96% 95% (!) 89% 92%  Weight:  78.4 kg    Height:       Neurology sleeping ENT with mild pallor Cardiovascular and pulmonary deferred, patient sleeping Mild lower extremity edema  Data Reviewed:    Family Communication: no family at the bedside   Disposition: Status is: Inpatient Remains inpatient appropriate because: inpatient renal replacement therapy   Planned Discharge Destination: Home     Author: Coralie Keens, MD 02/08/2023 1:09 PM  For on call review [email or web address].

## 2023-02-08 NOTE — Procedures (Signed)
During pre handoff, the nurse went to the patient to ask if he was willing to come to HD.  Patient stated he wanted to eat his breakfast first, so put patient in for transport a few min after other patients. Patient refused HD when transportation staff member arrived to bring him up.

## 2023-02-09 DIAGNOSIS — Z794 Long term (current) use of insulin: Secondary | ICD-10-CM | POA: Diagnosis not present

## 2023-02-09 DIAGNOSIS — Z992 Dependence on renal dialysis: Secondary | ICD-10-CM | POA: Diagnosis not present

## 2023-02-09 DIAGNOSIS — E8779 Other fluid overload: Secondary | ICD-10-CM | POA: Diagnosis not present

## 2023-02-09 DIAGNOSIS — D509 Iron deficiency anemia, unspecified: Secondary | ICD-10-CM | POA: Diagnosis not present

## 2023-02-09 DIAGNOSIS — I10 Essential (primary) hypertension: Secondary | ICD-10-CM | POA: Diagnosis not present

## 2023-02-09 DIAGNOSIS — E8770 Fluid overload, unspecified: Secondary | ICD-10-CM | POA: Diagnosis not present

## 2023-02-09 DIAGNOSIS — N186 End stage renal disease: Secondary | ICD-10-CM | POA: Diagnosis not present

## 2023-02-09 DIAGNOSIS — E119 Type 2 diabetes mellitus without complications: Secondary | ICD-10-CM | POA: Diagnosis not present

## 2023-02-09 DIAGNOSIS — K219 Gastro-esophageal reflux disease without esophagitis: Secondary | ICD-10-CM | POA: Diagnosis not present

## 2023-02-09 DIAGNOSIS — I5022 Chronic systolic (congestive) heart failure: Secondary | ICD-10-CM | POA: Diagnosis not present

## 2023-02-09 LAB — GLUCOSE, CAPILLARY: Glucose-Capillary: 197 mg/dL — ABNORMAL HIGH (ref 70–99)

## 2023-02-09 MED ORDER — ORAL CARE MOUTH RINSE: 15.0000 mL | OROMUCOSAL | Status: DC | PRN

## 2023-02-09 NOTE — Progress Notes (Signed)
   02/08/23 2225  Provider Notification  Provider Name/Title Dr. Margo Aye  Date Provider Notified 02/08/23  Time Provider Notified 2225  Method of Notification Page  Notification Reason Red med refusal (Heparin, BP Med, and Semglee)  Provider response No new orders   Patient refused shift assessment including skin assessment.  He also refused all his HS medications including insulin, blood pressure medication, and heparin.  He refused VS and CBGs.  He kept insisting on a peanut butter and jelly sandwich.  I advised that cafeteria was closed.  He wanted milk with sugar.  I made Dr. Margo Aye aware of the situation.  No additional orders at this time.  Bernie Covey RN

## 2023-02-09 NOTE — Progress Notes (Signed)
Hemodialysis called unit to see if patient is agreeable to go to hemodialysis now.  Per HD, if patient does not go now, they will not be able to take him to hemodialysis until Tuesday.  When I asked patient again, he stated "Tuesday" and "I will be gone by then."  I asked him again and he proceeded to keep his eyes closed and ignored me.  I told him that I would tell hemodialysis that he did not want to come at this time.  He still ignored me.  Bernie Covey RN

## 2023-02-09 NOTE — Progress Notes (Signed)
Progress Note   Patient: [name] [id number]:[id number] DOB: May 07, 1971 DOA: 02/04/2023     4 DOS: the patient was seen and examined on 02/09/2023   Brief hospital course: [name] was admitted to the hospital with the working diagnosis of heart failure exacerbation.   52 yo male with the past medical history of ESRD, heart failure, T2DM, hypertension and GERd who presented with worsening lower extremity edema. Last HD 02/01/23, four days prior to his hospitalization. Recent hospitalization 09/13 for volume overload, due to missing HD, he left on 09/14 against medical advice. Patient was note able to have HD as outpatient due to transportation difficulties.   Patient has been treated with intense consecutive days of hemodialysis with ultrafiltration.   09/18, 09/19 HD/ UF with good toleration, 09/21 HD/UF.   09/22 patient has declined HD today, has been refusing care. He is homeless, plan for next HD on 09.24.  Plan for discharge home tomorrow, will give information for homeless shelters and will get bus passes for transportation to HD.   Assessment and Plan: * ESRD on dialysis (HCC) Hypervolemia. Hypocalcemia.   He declined blood work and declined having HD today.  Plan for next HD on 09/24.   Metabolic bone disease, continue with calcitriol and phoslo.  Chronic systolic CHF reduced EF 20 to 57% (congestive heart failure) (HCC) Echocardiogram with reduced LV systolic function with EF 20 to 25%, global hypokinesis, severe LVH, RV with severe reduction in systolic function, RVSP 66.6 mmHg, LA with mild dilatation, RA with mild to moderate dilatation, small pericardial effusion, (posterior to the left ventricle), moderate TR,   Systolic blood pressure 123 to 115 mmHg  Plan to continue with carvedilol and low dose isosorbide. Fluid management per ultrafiltration in HD.  Plan to reduce isosorbide dose and discontinue amlodipine.  Continue carvedilol for now, to avoid rebound  tachycardia.   Fluid management per ultrafiltration.   Diabetes mellitus type 2, insulin dependent (HCC) Continue insulin sliding scale for glucose cover and monitoring. Basal insulin 7 units Capillary glucose 160, 89, 148   Essential hypertension Continue with carvedilol and isosorbide.  Ultrafiltration for volume management.   Microcytic anemia Stable cell count.  Anemia of chronic renal disease.   GERD (gastroesophageal reflux disease) Continue PPI        Subjective: Patient with no chest pain or dyspnea, no PND, orthopnea,  Physical Exam: Vitals:   02/07/23 1003 02/07/23 1634 02/07/23 2247 02/08/23 0950  BP: 138/68 96/62 123/72 115/64  Pulse: 80 80 76 74  Resp: 16 18 16 18   Temp: 97.8 F (36.6 C) 98 F (36.7 C) 98.8 F (37.1 C) 98.2 F (36.8 C)  TempSrc: Oral Oral  Oral  SpO2: 96% 95% (!) 89% 92%  Weight:  78.4 kg    Height:       Neurology awake and alert ENT with mild pallor Cardiovascular with S1 and S2 present and regular with no gallops, rubs or murmurs Respiratory with no wheezing or rhonchi Abdomen with no distention  Positive lower extremity edema ++  Data Reviewed:    Family Communication: no family at the bedside   Disposition: Status is: Inpatient Remains inpatient appropriate because: plan for discharge home tomorrow morning   Planned Discharge Destination: Home    Author: Coralie Keens, MD 02/09/2023 3:59 PM  For on call review [email or web address].

## 2023-02-09 NOTE — Progress Notes (Signed)
[facility] Progress Note  Subjective: pt refused HD last night.   Vitals:   02/07/23 1003 02/07/23 1634 02/07/23 2247 02/08/23 0950  BP: 138/68 96/62 123/72 115/64  Pulse: 80 80 76 74  Resp: 16 18 16 18   Temp: 97.8 F (36.6 C) 98 F (36.7 C) 98.8 F (37.1 C) 98.2 F (36.8 C)  TempSrc: Oral Oral  Oral  SpO2: 96% 95% (!) 89% 92%  Weight:  78.4 kg    Height:        Exam: Gen alert today No jvd or bruits Chest clear bilat to bases RRR no RG Abd soft, nontender, +bs Ext 1-2+ bilat pretib edema, 1+ hip edema Neuro - alert today   RFA AVG+bruit      Renal-related home meds: - rocaltrol 0.25 mcg tts - phoslo 3 ac - coreg 25 bid - imdur 60 hs      OP HD: TTS [facility]  4h   400/800   61.5kg  2/2.5 bath  RFA AVG   Heparin 2000 - hep B labs done 9/13 - last OP HD 8/31, post wt 75kg - rocaltrol 0.25 mcg po three times per week - no esa, last Hb 12 on 8/31    CXR 9/17 - mild vasc congestion, no edema.      Assessment/ Plan: SOB/ acute on chronic volume overload - due to missing HD. Sig bilat LE edema on admission. Admit CXR w/o edema, no resp issues, on RA. Has had HD here x 2 w/ 6 L UF total. Pt refused dialysis yesterday evening.  ESRD - on HD TTS. Last OP HD was 8/31. HD here Wed and Thursday. Refused HD yest as above. Next HD Tuesday (9/24) if still here.  HTN - BP's were down yesterday, and coreg was reduced. On imdur as well. BP's okay today.  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 Not on esa at OP unit. Follow.   MBD ckd - CCa was very low (CCa 6.5 at lowest here) on admission, phos was a bit high. Resumed his po vdra and binder (phoslo).  Started CaCO3 400mg  elemental tid (taking two 500mg  tums tid)--> Ca++ levels up a bit to 6.5 (CCa 7.3). Not symptomatic. Continue po vdra and Tums 500mg  2 tid.  DM2 - on insulin    Vinson Moselle MD  [facility] 02/09/2023, 12:33 PM  Recent Labs  Lab 02/05/23 0809 02/06/23 0824 02/08/23 0958  HGB 10.3* 10.3*  --   ALBUMIN 2.9* 2.8*  2.9*  CALCIUM 5.7* 6.1* 6.5*  PHOS  --   --  4.7*  CREATININE 17.12* 13.28* 11.35*  K 4.5 3.8 3.7   No results for input(s): "IRON", "TIBC", "FERRITIN" in the last 168 hours.  Inpatient medications:  aspirin EC  81 mg Oral Daily   calcitRIOL  0.25 mcg Oral Q T,Th,Sat-1800   calcium acetate  2,001 mg Oral TID WC   calcium carbonate  800 mg of elemental calcium Oral TID   carvedilol  12.5 mg Oral BID WC   Chlorhexidine Gluconate Cloth  6 each Topical Q0600   heparin  2,000 Units Dialysis Once in dialysis   heparin  2,000 Units Dialysis Once in dialysis   heparin  5,000 Units Subcutaneous Q8H   insulin aspart  0-6 Units Subcutaneous TID WC   insulin glargine-yfgn  7 Units Subcutaneous QHS   isosorbide mononitrate  30 mg Oral QHS   pantoprazole  40 mg Oral Daily   sodium chloride flush  3 mL Intravenous  Q12H    sodium chloride     anticoagulant sodium citrate     sodium chloride, acetaminophen **OR** acetaminophen, alteplase, anticoagulant sodium citrate, feeding supplement (NEPRO CARB STEADY), heparin, heparin, lidocaine (PF), lidocaine-prilocaine, mouth rinse, pentafluoroprop-tetrafluoroeth, senna-docusate, sodium chloride flush

## 2023-02-09 NOTE — Plan of Care (Signed)

## 2023-02-09 NOTE — Progress Notes (Signed)
HD RN followed up with the primary RN if patient wants to be dialyze pt still refuse at this time

## 2023-02-09 NOTE — Progress Notes (Signed)
Reported to MD patient refusing medications, assessments, vital signs, CBGs. Attempted and educated patient benefit and risks of not taking medications.

## 2023-02-10 DIAGNOSIS — I5022 Chronic systolic (congestive) heart failure: Secondary | ICD-10-CM | POA: Diagnosis not present

## 2023-02-10 DIAGNOSIS — E119 Type 2 diabetes mellitus without complications: Secondary | ICD-10-CM | POA: Diagnosis not present

## 2023-02-10 DIAGNOSIS — N186 End stage renal disease: Secondary | ICD-10-CM | POA: Diagnosis not present

## 2023-02-10 DIAGNOSIS — Z794 Long term (current) use of insulin: Secondary | ICD-10-CM | POA: Diagnosis not present

## 2023-02-10 DIAGNOSIS — Z992 Dependence on renal dialysis: Secondary | ICD-10-CM | POA: Diagnosis not present

## 2023-02-10 DIAGNOSIS — I10 Essential (primary) hypertension: Secondary | ICD-10-CM | POA: Diagnosis not present

## 2023-02-10 LAB — GLUCOSE, CAPILLARY
Glucose-Capillary: 108 mg/dL — ABNORMAL HIGH (ref 70–99)
Glucose-Capillary: 175 mg/dL — ABNORMAL HIGH (ref 70–99)
Glucose-Capillary: 119 mg/dL — ABNORMAL HIGH (ref 70–99)

## 2023-02-10 NOTE — Progress Notes (Signed)
During the shift, the patient continues to refuse all medications, weights, CBGs, and VS.  Attempted to educate patient on the importance of compliance.  He continued to ignore me.  Will continue to monitor patient.  Bernie Covey RN

## 2023-02-10 NOTE — Progress Notes (Addendum)
Progress Note   Patient: [name] ZOX:[id number] DOB: 07-08-1970 DOA: 02/04/2023     5 DOS: the patient was seen and examined on 02/10/2023   Brief hospital course: Mr. Norell was admitted to the hospital with the working diagnosis of heart failure exacerbation, volume overload in the setting of non compliance with hemodialysis.   52 yo male with the past medical history of ESRD, heart failure, T2DM, hypertension and GERd who presented with worsening lower extremity edema. Last HD 02/01/23, four days prior to his hospitalization. Recent hospitalization 09/13 for volume overload, due to missing HD, he left on 09/14 against medical advice. Patient was note able to have HD as outpatient due to transportation difficulties.   Patient has been treated with intense consecutive days of hemodialysis with ultrafiltration.   09/18, 09/19 HD/ UF with good toleration, 09/21 HD/UF.   09/22 patient has declined HD today, has been refusing care. He is homeless, plan for next HD on 09.24.  Plan for discharge tomorrow after HD.  Will give information for homeless shelters and will get bus passes for transportation to HD.   Assessment and Plan: * ESRD on dialysis (HCC) Hypervolemia. Hypocalcemia.   Continue to declined blood work . Clinically with increased volume, he has abdominal wall and lower extremity edema. Clinically no significant ascites.   Plan is for hemodialysis with ultrafiltration tomorrow, per his regular scheduled T, TH, Sat.   Metabolic bone disease, continue with calcitriol and phoslo.  Chronic systolic CHF reduced EF 20 to 81% (congestive heart failure) (HCC) Echocardiogram with reduced LV systolic function with EF 20 to 25%, global hypokinesis, severe LVH, RV with severe reduction in systolic function, RVSP 66.6 mmHg, LA with mild dilatation, RA with mild to moderate dilatation, small pericardial effusion, (posterior to the left ventricle), moderate TR,   Systolic blood pressure  162 mmHg  Plan to continue with carvedilol and isosorbide. Fluid management per ultrafiltration in HD.   Diabetes mellitus type 2, insulin dependent (HCC) Continue insulin sliding scale for glucose cover and monitoring. Basal insulin 7 units Capillary glucose 197, 108, 175 mg/dl.   Essential hypertension Continue with carvedilol and isosorbide.  Ultrafiltration for volume management.   Microcytic anemia Stable cell count.  Anemia of chronic renal disease.   GERD (gastroesophageal reflux disease) Continue PPI        Subjective: Patient with abdominal distention and lower extremity edema, no dyspnea or chest pain   Physical Exam: Vitals:   02/07/23 1634 02/07/23 2247 02/08/23 0950 02/10/23 0912  BP: 96/62 123/72 115/64 (!) 162/82  Pulse: 80 76 74 71  Resp: 18 16 18 18   Temp:   98.2 F (36.8 C) 98.4 F (36.9 C)  TempSrc: Oral  Oral   SpO2: 95% (!) 89% 92% 97%  Weight: 78.4 kg     Height:       Neurology awake and alert ENT with mild pallor Cardiovascular with S1 and S2 present and regular with no gallops, rubs or murmurs Respiratory with mild rales at bases with no wheezing or rhonchi Abdomen with distended and tympanic, positive pitting abdominal wall edema Positive lower extremity edema +++ bilaterally  Data Reviewed:    Family Communication: no family at the bedside   Disposition: Status is: Inpatient Remains inpatient appropriate because: plan for dc after HD tomorrow   Planned Discharge Destination: Home     Author: Coralie Keens, MD 02/10/2023 2:47 PM  For on call review [email or web address].

## 2023-02-10 NOTE — Plan of Care (Signed)

## 2023-02-10 NOTE — Progress Notes (Signed)
[facility] Progress Note   Subjective: Seen in room. Eating breakfast. Denies chest pain, sob. Abdomen swollen, asking if he can get fluid drained from abdomen  Objective Vitals:   02/07/23 1003 02/07/23 1634 02/07/23 2247 02/08/23 0950  BP: 138/68 96/62 123/72 115/64  Pulse: 80 80 76 74  Resp: 16 18 16 18   Temp: 97.8 F (36.6 C) 98 F (36.7 C) 98.8 F (37.1 C) 98.2 F (36.8 C)  TempSrc: Oral Oral  Oral  SpO2: 96% 95% (!) 89% 92%  Weight:  78.4 kg    Height:          Additional Objective Labs: Basic Metabolic Panel: Recent Labs  Lab 02/05/23 0809 02/06/23 0824 02/08/23 0958  NA 138 135 135  K 4.5 3.8 3.7  CL 102 102 101  CO2 15* 18* 22  GLUCOSE 99 196* 116*  BUN 118* 84* 67*  CREATININE 17.12* 13.28* 11.35*  CALCIUM 5.7* 6.1* 6.5*  PHOS  --   --  4.7*   CBC: Recent Labs  Lab 02/04/23 2125 02/05/23 0809 02/06/23 0824  WBC 8.6 6.9 5.9  HGB 11.2* 10.3* 10.3*  HCT 36.5* 32.7* 32.3*  MCV 78.5* 78.4* 78.0*  PLT 237 241 250   Blood Culture    Component Value Date/Time   SDES BLOOD SITE NOT SPECIFIED 11/16/2022 2055   SPECREQUEST  11/16/2022 2055    BOTTLES DRAWN AEROBIC AND ANAEROBIC Blood Culture adequate volume   CULT  11/16/2022 2055    NO GROWTH 5 DAYS Performed at [facility], [address] [place], [place] [address]    REPTSTATUS 11/21/2022 FINAL 11/16/2022 2055     Physical Exam General: Chronically ill appearing, nad Heart: Regular rate Lungs: Diminished Abdomen: tight, distended Extremities: 2+ bilateral le edema Dialysis Access: RUE AVG +t/b  Medications:  sodium chloride     anticoagulant sodium citrate      aspirin EC  81 mg Oral Daily   calcitRIOL  0.25 mcg Oral Q T,Th,Sat-1800   calcium acetate  2,001 mg Oral TID WC   calcium carbonate  800 mg of elemental calcium Oral TID   carvedilol  12.5 mg Oral BID WC   Chlorhexidine Gluconate Cloth  6 each Topical Q0600   heparin  2,000 Units Dialysis Once in  dialysis   heparin  2,000 Units Dialysis Once in dialysis   heparin  5,000 Units Subcutaneous Q8H   insulin aspart  0-6 Units Subcutaneous TID WC   insulin glargine-yfgn  7 Units Subcutaneous QHS   isosorbide mononitrate  30 mg Oral QHS   pantoprazole  40 mg Oral Daily   sodium chloride flush  3 mL Intravenous Q12H     OP HD: TTS [facility]  4h   400/800   61.5kg  2/2.5 bath  RFA AVG   Heparin 2000 - hep B labs done 9/13 - last OP HD 8/31, post wt 75kg - rocaltrol 0.25 mcg po three times per week - no esa, last Hb 12 on 8/31    CXR 9/17 - mild vasc congestion, no edema.      Assessment/ Plan: SOB/ acute on chronic volume overload - due to missing HD. Sig bilat LE edema on admission. Admit CXR w/o edema, no resp issues, on RA. Has had HD here x 2 w/ 6 L UF total. Pt refused dialysis Saturday.  ESRD - on HD TTS. Last OP HD was 8/31. HD here Wed and Thursday. Refused HD Sat as above. Next HD Tuesday (  9/24) if still here.  HTN - BP's have come down and coreg was reduced. On imdur as well.  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 Not on esa at OP unit. Follow.   MBD ckd - CCa was very low (CCa 6.5 at lowest here) on admission, phos was a bit high. Resumed his po vdra and binder (phoslo).  Started CaCO3 400mg  elemental tid (taking two 500mg  tums tid)--> Ca++ levels up a bit to 6.5 (CCa 7.3). Not symptomatic. Continue po vdra and Tums 500mg  2 tid.  DM2 - on insulin  Douglas Blase PA-C [facility] 02/10/2023,9:09 AM

## 2023-02-10 NOTE — Progress Notes (Signed)
Case discussed with attending. Plan is for pt to likely d/c tomorrow after HD. Attending feels it is in the pt's best interest to receive HD tomorrow and then d/c if stable due to pt declining HD on Saturday. Pt is homeless and transportation to clinic on day of d/c is a barrier. Will provide update to clinic upon pt's d/c. Will assist as needed.   Olivia Canter Renal Navigator [phone]

## 2023-02-11 DIAGNOSIS — Z992 Dependence on renal dialysis: Secondary | ICD-10-CM | POA: Diagnosis not present

## 2023-02-11 DIAGNOSIS — I10 Essential (primary) hypertension: Secondary | ICD-10-CM | POA: Diagnosis not present

## 2023-02-11 DIAGNOSIS — K219 Gastro-esophageal reflux disease without esophagitis: Secondary | ICD-10-CM | POA: Diagnosis not present

## 2023-02-11 DIAGNOSIS — N186 End stage renal disease: Secondary | ICD-10-CM | POA: Diagnosis not present

## 2023-02-11 DIAGNOSIS — D509 Iron deficiency anemia, unspecified: Secondary | ICD-10-CM | POA: Diagnosis not present

## 2023-02-11 DIAGNOSIS — Z794 Long term (current) use of insulin: Secondary | ICD-10-CM | POA: Diagnosis not present

## 2023-02-11 DIAGNOSIS — I5022 Chronic systolic (congestive) heart failure: Secondary | ICD-10-CM | POA: Diagnosis not present

## 2023-02-11 DIAGNOSIS — E119 Type 2 diabetes mellitus without complications: Secondary | ICD-10-CM | POA: Diagnosis not present

## 2023-02-11 LAB — GLUCOSE, CAPILLARY
Glucose-Capillary: 130 mg/dL — ABNORMAL HIGH (ref 70–99)
Glucose-Capillary: 205 mg/dL — ABNORMAL HIGH (ref 70–99)
Glucose-Capillary: 105 mg/dL — ABNORMAL HIGH (ref 70–99)

## 2023-02-11 NOTE — Progress Notes (Signed)
Progress Note   Patient: [name]:[id number] DOB: [birth year] DOA: 02/04/2023     6 DOS: the patient was seen and examined on 02/11/2023   Brief hospital course: Mr. Prajapati was admitted to the hospital with the working diagnosis of heart failure exacerbation, volume overload in the setting of non compliance with hemodialysis.   52 yo male with the past medical history of ESRD, heart failure, T2DM, hypertension and GERd who presented with worsening lower extremity edema. Last HD 02/01/23, four days prior to his hospitalization. Recent hospitalization 09/13 for volume overload, due to missing HD, he left on 09/14 against medical advice. Patient was note able to have HD as outpatient due to transportation difficulties.   Patient has been treated with intense consecutive days of hemodialysis with ultrafiltration.   09/18, 09/19 HD/ UF with good toleration, 09/21 HD/UF.   09/22 patient has declined HD today, has been refusing care. He is homeless, plan for next HD on 09.24.  Plan for discharge tomorrow after HD.  Will give information for homeless shelters and will get bus passes for transportation to HD.   Assessment and Plan: * ESRD on dialysis (HCC) Hypervolemia. Hypocalcemia.   Continue to declined blood work . Clinically with increased volume, he has abdominal wall and lower extremity edema. Clinically no significant ascites.   Plan is for inpatient hemodialysis with ultrafiltration, his regular scheduled is T, TH, Sat.  I have explained patient that with no dialysis his symptoms are going to worsen, and has risk for more severe disease.   Metabolic bone disease, continue with calcitriol and phoslo.  Chronic systolic CHF reduced EF 20 to 81% (congestive heart failure) (HCC) Echocardiogram with reduced LV systolic function with EF 20 to 25%, global hypokinesis, severe LVH, RV with severe reduction in systolic function, RVSP 66.6 mmHg, LA with mild dilatation, RA with mild to  moderate dilatation, small pericardial effusion, (posterior to the left ventricle), moderate TR,   Systolic blood pressure 162 mmHg, allowing permissive hypertension to allow better ultrafiltration on HD.   Plan to continue with carvedilol and isosorbide. Fluid management per ultrafiltration in HD.   Diabetes mellitus type 2, insulin dependent (HCC) Continue insulin sliding scale for glucose cover and monitoring. Basal insulin 7 units Capillary glucose 119, 130, 205 mg/dl.   Essential hypertension Continue with carvedilol and isosorbide.  Ultrafiltration for volume management.   Microcytic anemia Stable cell count.  Anemia of chronic renal disease.   GERD (gastroesophageal reflux disease) Continue PPI       Subjective: Patient declined HD this am, he continue to have abdominal distention with no nausea or vomiting, denies dyspnea or chest pain   Physical Exam: Vitals:   02/10/23 0912 02/11/23 0708 02/11/23 1400 02/11/23 1458  BP: (!) 162/82 (!) 142/90 (!) 163/87 (!) 163/87  Pulse: 71 70 75 75  Resp: 18 18 18 18   Temp: 98.4 F (36.9 C) 98 F (36.7 C) 98.6 F (37 C) 98.6 F (37 C)  TempSrc:  Oral Oral Oral  SpO2: 97% 92% 93% 93%  Weight:      Height:       Neurology awake and alert. Poorly interactive ENT with mild pallor Cardiovascular with S1 and S2 present and regular Respiratory with mild rales with no wheezing Abdomen with positive abdominal wall edema, pitting No signs of ascites.  Positive lower extremity edema +++ pitting  Data Reviewed:    Family Communication: no family at the bedside   Disposition: Status is: Inpatient Remains inpatient  appropriate because: volume overload, needs renal replacement therapy   Planned Discharge Destination: Home    Author: Coralie Keens, MD 02/11/2023 4:41 PM  For on call review [email or web address].

## 2023-02-11 NOTE — Plan of Care (Signed)

## 2023-02-11 NOTE — Progress Notes (Signed)
Dee  ( Consulting civil engineer for [employer] located in room [phone]) called and confirmed that this pt is refusing HD today--HD charge nurse made aware

## 2023-02-11 NOTE — Progress Notes (Signed)
[facility] Progress Note   Subjective: Seen in room with bed sheet over his head. Did not remove sheet when asked. Refused dialysis this am says he wanted to eat and we won't leave him alone. Will not say if he will go later today.   Objective Vitals:   02/07/23 2247 02/08/23 0950 02/10/23 0912 02/11/23 0708  BP:  115/64 (!) 162/82 (!) 142/90  Pulse: 76 74 71 70  Resp: 16 18 18 18   Temp:   98.4 F (36.9 C) 98 F (36.7 C)  TempSrc:  Oral  Oral  SpO2: (!) 89% 92% 97% 92%  Weight:      Height:          Additional Objective Labs: Basic Metabolic Panel: Recent Labs  Lab 02/05/23 0809 02/06/23 0824 02/08/23 0958  NA 138 135 135  K 4.5 3.8 3.7  CL 102 102 101  CO2 15* 18* 22  GLUCOSE 99 196* 116*  BUN 118* 84* 67*  CREATININE 17.12* 13.28* 11.35*  CALCIUM 5.7* 6.1* 6.5*  PHOS  --   --  4.7*   CBC: Recent Labs  Lab 02/04/23 2125 02/05/23 0809 02/06/23 0824  WBC 8.6 6.9 5.9  HGB 11.2* 10.3* 10.3*  HCT 36.5* 32.7* 32.3*  MCV 78.5* 78.4* 78.0*  PLT 237 241 250   Blood Culture    Component Value Date/Time   SDES BLOOD SITE NOT SPECIFIED 11/16/2022 2055   SPECREQUEST  11/16/2022 2055    BOTTLES DRAWN AEROBIC AND ANAEROBIC Blood Culture adequate volume   CULT  11/16/2022 2055    NO GROWTH 5 DAYS Performed at [facility], [address] [place], [place] [address]    REPTSTATUS 11/21/2022 FINAL 11/16/2022 2055     Physical Exam General: Could not complete exam today, would not remove bedsheets covering his head.  Extremities: 2+ bilateral le edema Dialysis Access: RUE AVG  Medications:  sodium chloride     anticoagulant sodium citrate      aspirin EC  81 mg Oral Daily   calcitRIOL  0.25 mcg Oral Q T,Th,Sat-1800   calcium acetate  2,001 mg Oral TID WC   calcium carbonate  800 mg of elemental calcium Oral TID   carvedilol  12.5 mg Oral BID WC   Chlorhexidine Gluconate Cloth  6 each Topical Q0600   heparin  2,000 Units Dialysis  Once in dialysis   heparin  2,000 Units Dialysis Once in dialysis   heparin  5,000 Units Subcutaneous Q8H   insulin aspart  0-6 Units Subcutaneous TID WC   insulin glargine-yfgn  7 Units Subcutaneous QHS   isosorbide mononitrate  30 mg Oral QHS   pantoprazole  40 mg Oral Daily   sodium chloride flush  3 mL Intravenous Q12H     OP HD: TTS [facility]  4h   400/800   61.5kg  2/2.5 bath  RFA AVG   Heparin 2000 - hep B labs done 9/13 - last OP HD 8/31, post wt 75kg - rocaltrol 0.25 mcg po three times per week - no esa, last Hb 12 on 8/31    CXR 9/17 - mild vasc congestion, no edema.      Assessment/ Plan: SOB/ acute on chronic volume overload - due to missing HD. Sig bilat LE edema on admission. Admit CXR w/o edema, no resp issues, on RA. Has had HD here x 2 w/ 6 L UF total.  ESRD - on HD TTS. Last OP HD was 8/31. HD here  Wed and Thursday. Refused HD Sat as above. Refused HD this am, unclear if he will agree to HD later today   HTN - BP's have come down and coreg was reduced. On imdur as well.  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 Not on esa at OP unit. Follow.   MBD ckd - CCa was very low (CCa 6.5 at lowest here) on admission, phos was a bit high. Resumed his po vdra and binder (phoslo).  Started CaCO3 400mg  elemental tid (taking two 500mg  tums tid)--> Ca++ levels up a bit to 6.5 (CCa 7.3). Not symptomatic. Continue po vdra and Tums 500mg  2 tid.  DM2 - on insulin  Tomasa Blase PA-C [employer] [employer] 02/11/2023,9:45 AM

## 2023-02-11 NOTE — Plan of Care (Signed)

## 2023-02-12 DIAGNOSIS — Z992 Dependence on renal dialysis: Secondary | ICD-10-CM | POA: Diagnosis not present

## 2023-02-12 DIAGNOSIS — N186 End stage renal disease: Secondary | ICD-10-CM | POA: Diagnosis not present

## 2023-02-12 LAB — GLUCOSE, CAPILLARY
Glucose-Capillary: 82 mg/dL (ref 70–99)
Glucose-Capillary: 150 mg/dL — ABNORMAL HIGH (ref 70–99)
Glucose-Capillary: 115 mg/dL — ABNORMAL HIGH (ref 70–99)
Glucose-Capillary: 192 mg/dL — ABNORMAL HIGH (ref 70–99)

## 2023-02-12 LAB — RENAL FUNCTION PANEL
Albumin: 3.1 g/dL — ABNORMAL LOW (ref 3.5–5.0)
Anion gap: 20 — ABNORMAL HIGH (ref 5–15)
BUN: 52 mg/dL — ABNORMAL HIGH (ref 6–20)
CO2: 22 mmol/L (ref 22–32)
Calcium: 8.2 mg/dL — ABNORMAL LOW (ref 8.9–10.3)
Chloride: 94 mmol/L — ABNORMAL LOW (ref 98–111)
Creatinine, Ser: 8.79 mg/dL — ABNORMAL HIGH (ref 0.61–1.24)
GFR, Estimated: 7 mL/min — ABNORMAL LOW (ref >60)
Glucose, Bld: 87 mg/dL (ref 70–99)
Phosphorus: 2.8 mg/dL (ref 2.5–4.6)
Potassium: 4.1 mmol/L (ref 3.5–5.1)
Sodium: 136 mmol/L (ref 135–145)

## 2023-02-12 MED ORDER — LIDOCAINE HCL (PF) 1 % IJ SOLN: 5.0000 mL | INTRAMUSCULAR | Status: DC | PRN

## 2023-02-12 MED ORDER — ALTEPLASE 2 MG IJ SOLR: 2.0000 mg | Freq: Once | INTRAMUSCULAR | Status: DC | PRN

## 2023-02-12 MED ORDER — CAMPHOR-MENTHOL 0.5-0.5 % EX LOTN
TOPICAL_LOTION | CUTANEOUS | Status: DC | PRN
  Filled 2023-02-12: qty 222

## 2023-02-12 MED ORDER — HYDROCORTISONE 0.5 % EX CREA
TOPICAL_CREAM | Freq: Two times a day (BID) | CUTANEOUS | Status: DC
  Filled 2023-02-12: qty 28.35

## 2023-02-12 MED ORDER — HYDRALAZINE HCL 25 MG PO TABS
25.0000 mg | ORAL_TABLET | Freq: Three times a day (TID) | ORAL | Status: DC
  Administered 2023-02-12 – 2023-02-14 (×3): 25 mg via ORAL
  Filled 2023-02-12 (×8): qty 1

## 2023-02-12 MED ORDER — HEPARIN SODIUM (PORCINE) 1000 UNIT/ML DIALYSIS: 2000.0000 [IU] | INTRAMUSCULAR | Status: DC | PRN

## 2023-02-12 MED ORDER — HYDRALAZINE HCL 20 MG/ML IJ SOLN: 10.0000 mg | Freq: Four times a day (QID) | INTRAMUSCULAR | Status: DC | PRN

## 2023-02-12 MED ORDER — ANTICOAGULANT SODIUM CITRATE 4% (200MG/5ML) IV SOLN: 5.0000 mL | Status: DC | PRN

## 2023-02-12 MED ORDER — HEPARIN SODIUM (PORCINE) 1000 UNIT/ML DIALYSIS: 1000.0000 [IU] | INTRAMUSCULAR | Status: DC | PRN

## 2023-02-12 MED ORDER — PENTAFLUOROPROP-TETRAFLUOROETH EX AERO: 1.0000 | INHALATION_SPRAY | CUTANEOUS | Status: DC | PRN

## 2023-02-12 MED ORDER — LIDOCAINE-PRILOCAINE 2.5-2.5 % EX CREA: 1.0000 | TOPICAL_CREAM | CUTANEOUS | Status: DC | PRN

## 2023-02-12 MED ORDER — ISOSORBIDE MONONITRATE ER 60 MG PO TB24
60.0000 mg | ORAL_TABLET | Freq: Every day | ORAL | Status: DC
  Administered 2023-02-12 – 2023-02-13 (×2): 60 mg via ORAL
  Filled 2023-02-12 (×2): qty 1

## 2023-02-12 NOTE — Progress Notes (Signed)
Received patient in bed to unit.  Alert and oriented.  Informed consent signed and in chart.   TX duration: 3:26  Patient tolerated well.  Transported back to the room  Alert, without acute distress.  Hand-off given to patient's nurse.   Access used: RFA AVG Access issues: None  Total UF removed: 4400 mL Medication(s) given: Heparin bolus 2000 units Post HD VS: please see data insert    02/12/23 0521  Vitals  Temp 98.4 F (36.9 C)  Temp Source Oral  BP (!) 224/96  MAP (mmHg) 133  BP Location Left Leg  BP Method Automatic  Patient Position (if appropriate) Lying  Pulse Rate 75  Pulse Rate Source Monitor  ECG Heart Rate 76  Resp (!) 9  Oxygen Therapy  SpO2 94 %  O2 Device Room Air  Patient Activity (if Appropriate) In bed  Pulse Oximetry Type Continuous  Post Treatment  Dialyzer Clearance Lightly streaked  Hemodialysis Intake (mL) 0 mL  Liters Processed 81.7  Fluid Removed (mL) 4400 mL  Tolerated HD Treatment Yes  Post-Hemodialysis Comments Treatment terminated with 4 minutes remaining d/t BLE cramping; symptoms subsided with rinseback. Blood returned without issue.  AVG/AVF Arterial Site Held (minutes) 10 minutes (Gauze applied and secured with paper tape; hemostasis achieved.)  AVG/AVF Venous Site Held (minutes) 10 minutes (Gauze applied and secured with paper tape; hemostasis achieved.)  Note  Patient Observations Patient awakened from sleep, no c/o voiced, no acute distress noted; condition stable for return transport.  Fistula / Graft Right Forearm  No placement date or time found.   Placed prior to admission: Yes  Orientation: Right  Access Location: Forearm  Site Condition No complications  Fistula / Graft Assessment Present;Thrill;Bruit  Status Flushed;Patent;Deaccessed  Drainage Description None      [name]

## 2023-02-12 NOTE — Progress Notes (Signed)
[facility] Progress Note   Subjective: Seen in room. Completed dialysis overnight net UF 4.4L No new complaints this am.    Objective Vitals:   02/12/23 0436 02/12/23 0506 02/12/23 0521 02/12/23 0820  BP: (!) 208/99 (!) 220/109 (!) 224/96 (!) 148/88  Pulse: 74 74 75 73  Resp: 15 12 (!) 9 18  Temp:   98.4 F (36.9 C) 98 F (36.7 C)  TempSrc:   Oral Oral  SpO2: 95% 97% 94% 98%  Weight:      Height:          Additional Objective Labs: Basic Metabolic Panel: Recent Labs  Lab 02/06/23 0824 02/08/23 0958 02/12/23 0709  NA 135 135 136  K 3.8 3.7 4.1  CL 102 101 94*  CO2 18* 22 22  GLUCOSE 196* 116* 87  BUN 84* 67* 52*  CREATININE 13.28* 11.35* 8.79*  CALCIUM 6.1* 6.5* 8.2*  PHOS  --  4.7* 2.8   CBC: Recent Labs  Lab 02/06/23 0824  WBC 5.9  HGB 10.3*  HCT 32.3*  MCV 78.0*  PLT 250   Blood Culture    Component Value Date/Time   SDES BLOOD SITE NOT SPECIFIED 11/16/2022 2055   SPECREQUEST  11/16/2022 2055    BOTTLES DRAWN AEROBIC AND ANAEROBIC Blood Culture adequate volume   CULT  11/16/2022 2055    NO GROWTH 5 DAYS Performed at [facility], [address], [place], [place] [address]    REPTSTATUS 11/21/2022 FINAL 11/16/2022 2055     Physical Exam General: Alert, nad Heart: Regular rate Lungs: Clear normal wob Abd: tight distended  Extremities: 2+ bilateral le edema Dialysis Access: RUE AVG  Medications:  sodium chloride      aspirin EC  81 mg Oral Daily   calcitRIOL  0.25 mcg Oral Q T,Th,Sat-1800   calcium acetate  2,001 mg Oral TID WC   calcium carbonate  800 mg of elemental calcium Oral TID   carvedilol  12.5 mg Oral BID WC   Chlorhexidine Gluconate Cloth  6 each Topical Q0600   heparin  5,000 Units Subcutaneous Q8H   hydrALAZINE  25 mg Oral Q8H   insulin aspart  0-6 Units Subcutaneous TID WC   insulin glargine-yfgn  7 Units Subcutaneous QHS   isosorbide mononitrate  60 mg Oral QHS   pantoprazole  40 mg Oral Daily    sodium chloride flush  3 mL Intravenous Q12H     OP HD: TTS [facility]  4h   400/800   61.5kg  2/2.5 bath  RFA AVG   Heparin 2000 - hep B labs done 9/13 - last OP HD 8/31, post wt 75kg - rocaltrol 0.25 mcg po three times per week - no esa, last Hb 12 on 8/31    CXR 9/17 - mild vasc congestion, no edema.      Assessment/ Plan: SOB/ acute on chronic volume overload - due to missing HD. Sig bilat LE edema on admission. Admit CXR w/o edema, no resp issues, on RA. Has had HD x 3 here, needs serial HD  ESRD - on HD TTS. Last OP HD was 8/31. Intermittently refusing HD in the hospital. Dialyzed overnight. Next HD 9/26 HTN - BP's have come down and coreg was reduced. On imdur as well.  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 Not on esa at OP unit. Follow.   MBD ckd - CCa was very low (CCa 6.5 at lowest here) on admission, phos was a bit high.  Resumed his po vdra and binder (phoslo).. Continue po vdra and Tums 500mg  2 tid. Calcium improving.  DM2 - on insulin  Tomasa Blase PA-C [facility] 02/12/2023,10:39 AM

## 2023-02-12 NOTE — Progress Notes (Signed)
PROGRESS NOTE    [name]  [id number]:[id number] DOB: 03-10-1971 DOA: 02/04/2023 PCP: [name]  [name] with history of chronic systolic CHF, ESRD, type 2 diabetes mellitus, hypertension, GERD presented to the ED with worsening lower extremity edema frequent hospitalizations for volume overload from missed HD, reports missing HD on account of transportation issues, he is homeless  Subjective: -Sleeping, blood pressures in the 220s this morning  Assessment and Plan:   ESRD on dialysis (HCC) Hypervolemia. Hypocalcemia.  -sp 3 dialysis treatments inpatient, last overnight -Frequent hospitalizations for same, complicated by homelessness and transportation issues, compliance -Social work consult  Chronic systolic CHF reduced EF 20 to 57% (congestive heart failure) (HCC) -Last echo 6/24 with a EF of 20-25%, severe LVH, severely reduced RV -continue Imdur and Coreg Volume managed with HD  Diabetes mellitus type 2, insulin dependent (HCC) -Stable, continue glargine and sliding scale  Uncontrolled hypertension -Add IV and p.o. hydralazine this morning, systolic blood pressure in the 220s Continue with carvedilol and isosorbide.  Ultrafiltration for volume management.   Microcytic anemia Stable cell count.  Anemia of chronic renal disease.   GERD (gastroesophageal reflux disease) Continue PPI  Homelessness Poor compliance -TOC, social work consult  DVT prophylaxis: hep SQ Code Status: Full code Family Communication: None  Disposition Plan: Unknown  Consultants:    Procedures:   Antimicrobials:    Objective: Vitals:   02/12/23 0436 02/12/23 0506 02/12/23 0521 02/12/23 0820  BP: (!) 208/99 (!) 220/109 (!) 224/96 (!) 148/88  Pulse: 74 74 75 73  Resp: 15 12 (!) 9 18  Temp:   98.4 F (36.9 C) 98 F (36.7 C)  TempSrc:   Oral Oral  SpO2: 95% 97% 94% 98%  Weight:      Height:        Intake/Output Summary (Last 24 hours) at 02/12/2023 1137 Last data filed at  02/12/2023 0915 Gross per 24 hour  Intake 240 ml  Output 4400 ml  Net -4160 ml   Filed Weights   02/07/23 1634  Weight: 78.4 kg    Examination:  Chronically ill male laying in bed, somnolent but easily arousable, oriented x 3 HEENT: Positive JVD CVS: S1-S2, regular rhythm Lungs: Clear bilaterally Abdomen: Mildly distended, nontender, bowel sounds present Extremities: 1-2+ edema, right arm AV graft    Data Reviewed:   CBC: Recent Labs  Lab 02/06/23 0824  WBC 5.9  HGB 10.3*  HCT 32.3*  MCV 78.0*  PLT 250   Basic Metabolic Panel: Recent Labs  Lab 02/06/23 0824 02/08/23 0958 02/12/23 0709  NA 135 135 136  K 3.8 3.7 4.1  CL 102 101 94*  CO2 18* 22 22  GLUCOSE 196* 116* 87  BUN 84* 67* 52*  CREATININE 13.28* 11.35* 8.79*  CALCIUM 6.1* 6.5* 8.2*  PHOS  --  4.7* 2.8   GFR: Estimated Creatinine Clearance: 9.5 mL/min (A) (by C-G formula based on SCr of 8.79 mg/dL (H)). Liver Function Tests: Recent Labs  Lab 02/06/23 0824 02/08/23 0958 02/12/23 0709  AST 17  --   --   ALT 15  --   --   ALKPHOS 147*  --   --   BILITOT 0.6  --   --   PROT 6.8  --   --   ALBUMIN 2.8* 2.9* 3.1*   No results for input(s): "LIPASE", "AMYLASE" in the last 168 hours. No results for input(s): "AMMONIA" in the last 168 hours. Coagulation Profile: No results for input(s): "INR", "PROTIME" in the  last 168 hours. Cardiac Enzymes: No results for input(s): "CKTOTAL", "CKMB", "CKMBINDEX", "TROPONINI" in the last 168 hours. BNP (last 3 results) No results for input(s): "PROBNP" in the last 8760 hours. HbA1C: No results for input(s): "HGBA1C" in the last 72 hours. CBG: Recent Labs  Lab 02/11/23 0703 02/11/23 1221 02/11/23 1728 02/12/23 0733 02/12/23 1126  GLUCAP 130* 205* 105* 82 150*   Lipid Profile: No results for input(s): "CHOL", "HDL", "LDLCALC", "TRIG", "CHOLHDL", "LDLDIRECT" in the last 72 hours. Thyroid Function Tests: No results for input(s): "TSH", "T4TOTAL",  "FREET4", "T3FREE", "THYROIDAB" in the last 72 hours. Anemia Panel: No results for input(s): "VITAMINB12", "FOLATE", "FERRITIN", "TIBC", "IRON", "RETICCTPCT" in the last 72 hours. Urine analysis:    Component Value Date/Time   COLORURINE YELLOW 02/19/2021 0045   APPEARANCEUR HAZY (A) 02/19/2021 0045   APPEARANCEUR Clear 05/09/2020 1509   LABSPEC 1.013 02/19/2021 0045   PHURINE 5.0 02/19/2021 0045   GLUCOSEU >=500 (A) 02/19/2021 0045   HGBUR MODERATE (A) 02/19/2021 0045   BILIRUBINUR NEGATIVE 02/19/2021 0045   BILIRUBINUR Negative 05/09/2020 1509   KETONESUR NEGATIVE 02/19/2021 0045   PROTEINUR >=300 (A) 02/19/2021 0045   UROBILINOGEN 0.2 02/04/2020 1612   UROBILINOGEN 0.2 02/16/2014 0045   NITRITE NEGATIVE 02/19/2021 0045   LEUKOCYTESUR NEGATIVE 02/19/2021 0045   Sepsis Labs: @LABRCNTIP (procalcitonin:4,lacticidven:4)  )No results found for this or any previous visit (from the past 240 hour(s)).   Radiology Studies: No results found.   Scheduled Meds:  aspirin EC  81 mg Oral Daily   calcitRIOL  0.25 mcg Oral Q T,Th,Sat-1800   calcium acetate  2,001 mg Oral TID WC   calcium carbonate  800 mg of elemental calcium Oral TID   carvedilol  12.5 mg Oral BID WC   Chlorhexidine Gluconate Cloth  6 each Topical Q0600   heparin  5,000 Units Subcutaneous Q8H   hydrALAZINE  25 mg Oral Q8H   insulin aspart  0-6 Units Subcutaneous TID WC   insulin glargine-yfgn  7 Units Subcutaneous QHS   isosorbide mononitrate  60 mg Oral QHS   pantoprazole  40 mg Oral Daily   sodium chloride flush  3 mL Intravenous Q12H   Continuous Infusions:  sodium chloride       LOS: 7 days    Time spent:    Zannie Cove, MD [facility]   02/12/2023, 11:37 AM

## 2023-02-12 NOTE — Progress Notes (Signed)
Pt is declining vital signs and blood sugar and all meds including semglee insulin. supper blood sugar was 105-no coverage. He declined HD this am. Hemodialysis may be able to work him in around midnight. Pt in agreement at this time. He also no longer has PIV access but nothing ordered IV.

## 2023-02-12 NOTE — Consult Note (Signed)
[facility] Customer service manager [facility]) Accountable Care Organization (ACO) [facility] Liaison Note  02/12/2023  [name] [birth year] [id number]  Location: [facility] Liaison met patient at bedside at [facility].  Insurance: [employer]   [name] is a 52 y.o. male who is a Primary Care Patient of [name]. The patient was screened for 7 and 30 day readmission hospitalization with noted extreme risk score for unplanned readmission risk with 5 IP/20 ED in 6 months.  The patient was assessed for potential [facility]) Care Management service needs for post hospital transition for care coordination. Review of patient's electronic medical record reveals patient was admitted for ESRD on dialysis. EPIC notes indicates pt receives HD on TTS missing several days resulting in edema. Liaison met with pt at bedside to introduce liaison and inquire on support post discharge. Pt states he is homeless making it difficult to get to his HD days. Pt states the TOC SW is working on on finding him a place to stay. Pt states two shelters has "kick him out" and he does not want to return. Pt also complains of HOH and request this to be addressed. Liaison has collaborated with a covering nurse  concerning the pt's request and she is aware of this issues. No other inquires or request from the pt at this time. No updates noted via TOC on pt's discharge disposition.   [facility] Care Management/Population Health does not replace or interfere with any arrangements made by the Inpatient Transition of Care team.   For questions contact:   Elliot Cousin, RN, [facility] Liaison [place]   Population Health Office Hours MTWF  8:00 am-6:00 pm [phone] mobile [phone] [Office toll free line] Office Hours are M-F 8:30 - 5 pm [email or web address]

## 2023-02-12 NOTE — Progress Notes (Signed)
[name] has refused to take his evening medications. VS: 151/76, T 98.39F, P 76 and R 18. No complaint of pain. This has been a pattern throughout the day (please see previous notes) Despite education of his meds especially Hydralazine, he declined. he also decline his hydrocortisone that he had requested earlier. Md and Press photographer informed.

## 2023-02-12 NOTE — Progress Notes (Signed)
Mr Moga refused to take his medication. (Hydralazine, Phoslo, and Heparin) Educated pt on the importance of his medication in reference to his care. Still refused to take. MD informed.

## 2023-02-12 NOTE — Plan of Care (Signed)

## 2023-02-13 DIAGNOSIS — N186 End stage renal disease: Secondary | ICD-10-CM | POA: Diagnosis not present

## 2023-02-13 DIAGNOSIS — Z992 Dependence on renal dialysis: Secondary | ICD-10-CM | POA: Diagnosis not present

## 2023-02-13 LAB — BASIC METABOLIC PANEL
Anion gap: 12 (ref 5–15)
BUN: 69 mg/dL — ABNORMAL HIGH (ref 6–20)
CO2: 24 mmol/L (ref 22–32)
Calcium: 7.6 mg/dL — ABNORMAL LOW (ref 8.9–10.3)
Chloride: 98 mmol/L (ref 98–111)
Creatinine, Ser: 10.77 mg/dL — ABNORMAL HIGH (ref 0.61–1.24)
GFR, Estimated: 5 mL/min — ABNORMAL LOW (ref >60)
Glucose, Bld: 149 mg/dL — ABNORMAL HIGH (ref 70–99)
Potassium: 4.4 mmol/L (ref 3.5–5.1)
Sodium: 134 mmol/L — ABNORMAL LOW (ref 135–145)

## 2023-02-13 LAB — CBC
HCT: 30.9 % — ABNORMAL LOW (ref 39.0–52.0)
Hemoglobin: 9.3 g/dL — ABNORMAL LOW (ref 13.0–17.0)
MCH: 25.1 pg — ABNORMAL LOW (ref 26.0–34.0)
MCHC: 30.1 g/dL (ref 30.0–36.0)
MCV: 83.3 fL (ref 80.0–100.0)
Platelets: 266 10*3/uL (ref 150–400)
RBC: 3.71 MIL/uL — ABNORMAL LOW (ref 4.22–5.81)
RDW: 20 % — ABNORMAL HIGH (ref 11.5–15.5)
WBC: 7.4 10*3/uL (ref 4.0–10.5)
nRBC: 0 % (ref 0.0–0.2)

## 2023-02-13 LAB — GLUCOSE, CAPILLARY: Glucose-Capillary: 190 mg/dL — ABNORMAL HIGH (ref 70–99)

## 2023-02-13 MED ORDER — HYDROMORPHONE HCL 1 MG/ML IJ SOLN: 0.5000 mg | Freq: Once | INTRAMUSCULAR | Status: DC

## 2023-02-13 MED ORDER — NALOXONE HCL 0.4 MG/ML IJ SOLN: 0.4000 mg | INTRAMUSCULAR | Status: DC | PRN

## 2023-02-13 MED ORDER — OXYCODONE HCL 5 MG PO TABS: 5.0000 mg | ORAL_TABLET | Freq: Once | ORAL | Status: DC

## 2023-02-13 NOTE — Progress Notes (Signed)
   02/13/23 1405  Vitals  Temp 98.2 F (36.8 C)  Temp Source Oral  BP Method Automatic  Patient Position (if appropriate) Lying  Pulse Rate 71  Oxygen Therapy  SpO2 100 %  O2 Device Nasal Cannula  O2 Flow Rate (L/min) 3 L/min  Patient Activity (if Appropriate) In bed  During Treatment Monitoring  HD Safety Checks Performed Yes  Intra-Hemodialysis Comments Tx completed  Post Treatment  Dialyzer Clearance Clotted  Hemodialysis Intake (mL) 0 mL  Liters Processed 84  Fluid Removed (mL) 5000 mL  Tolerated HD Treatment Yes  Post-Hemodialysis Comments Pt goal met.  AVG/AVF Arterial Site Held (minutes) 10 minutes  AVG/AVF Venous Site Held (minutes) 10 minutes  Fistula / Graft Right Forearm  No placement date or time found.   Placed prior to admission: Yes  Orientation: Right  Access Location: Forearm  Site Condition No complications  Fistula / Graft Assessment Present;Thrill;Bruit  Status Deaccessed  Needle Size 15  Drainage Description None

## 2023-02-13 NOTE — Progress Notes (Signed)
Patient was found in shower when I came to give his medications. He refused his morning labs and medications

## 2023-02-13 NOTE — Procedures (Signed)
Patient was seen on dialysis and the procedure was supervised.  BFR 400  Via AVG BP is  151/76.   Patient appears to be tolerating treatment well.  [name] 02/13/2023

## 2023-02-13 NOTE — Progress Notes (Signed)
Navigator was contacted by pt's out-pt HD clinic social worker and advised that pt is set-up for transportation to out-pt HD with [employer] [phone]). Transportation can provide transportation to/from HD. This info was provided to pt's RN CM. Will assist as needed.   Kathlen Brunswick Renal Navigator [phone]

## 2023-02-13 NOTE — Progress Notes (Signed)
PROGRESS NOTE    [name]  WUJ:[id number] DOB: [birth year] DOA: 02/04/2023 P[facility]: [name]  [name] with history of chronic systolic CHF, ESRD, chronic respiratory failure on 2 to 3 L home O2 type 2 diabetes mellitus, hypertension, GERD presented to the ED with worsening lower extremity edema frequent hospitalizations for volume overload from missed HD, reports missing HD on account of transportation issues, he is homeless  Subjective: -Seen on dialysis, feels fair, ate breakfast this morning, reports that he uses oxygen at baseline  Assessment and Plan:   ESRD on dialysis (HCC) Hypervolemia. Hypocalcemia.  -Improving, repeat HD today -Frequent hospitalizations for same, complicated by homelessness and transportation issues, compliance -Social work consult  Chronic systolic CHF reduced EF 20 to 95% (congestive heart failure) (HCC) -Last echo 6/24 with a EF of 20-25%, severe LVH, severely reduced RV -continue Imdur and Coreg Volume managed with HD  Diabetes mellitus type 2, insulin dependent (HCC) -Stable, continue glargine and sliding scale  Uncontrolled hypertension -Improving, continue Imdur, hydralazine and carvedilol Ultrafiltration for volume management.   Microcytic anemia Stable cell count.  Anemia of chronic renal disease.   GERD (gastroesophageal reflux disease) Continue PPI  Homelessness Poor compliance -TOC, social work consult  DVT prophylaxis: hep SQ Code Status: Full code Family Communication: None  Disposition Plan: Unknown  Consultants:    Procedures:   Antimicrobials:    Objective: Vitals:   02/13/23 1100 02/13/23 1130 02/13/23 1200 02/13/23 1230  BP: (!) 140/80 (!) 154/83 (!) 142/77 (!) 140/72  Pulse: 69 69 68 67  Resp: 11 12 15 10   Temp:      TempSrc:      SpO2: 100% 100% 98%   Weight:      Height:        Intake/Output Summary (Last 24 hours) at 02/13/2023 1237 Last data filed at 02/13/2023 0900 Gross per 24 hour  Intake 240  ml  Output --  Net 240 ml   Filed Weights   02/13/23 0924  Weight: 80.1 kg    Examination:  Patient seen on dialysis, chronically ill male laying in bed, minimally interactive, keeps his eyes closed, oriented x 3 HEENT: Positive JVD CVS: S1-S2, regular rhythm Lungs: Clear bilaterally Abdomen: Soft, nontender, bowel sounds present Extremities: 1+ chronic edema, right arm AV graft  Data Reviewed:   CBC: Recent Labs  Lab 02/13/23 0940  WBC 7.4  HGB 9.3*  HCT 30.9*  MCV 83.3  PLT 266   Basic Metabolic Panel: Recent Labs  Lab 02/08/23 0958 02/12/23 0709 02/13/23 0940  NA 135 136 134*  K 3.7 4.1 4.4  CL 101 94* 98  CO2 22 22 24   GLUCOSE 116* 87 149*  BUN 67* 52* 69*  CREATININE 11.35* 8.79* 10.77*  CALCIUM 6.5* 8.2* 7.6*  PHOS 4.7* 2.8  --    GFR: Estimated Creatinine Clearance: 8.4 mL/min (A) (by C-G formula based on SCr of 10.77 mg/dL (H)). Liver Function Tests: Recent Labs  Lab 02/08/23 0958 02/12/23 0709  ALBUMIN 2.9* 3.1*   No results for input(s): "LIPASE", "AMYLASE" in the last 168 hours. No results for input(s): "AMMONIA" in the last 168 hours. Coagulation Profile: No results for input(s): "INR", "PROTIME" in the last 168 hours. Cardiac Enzymes: No results for input(s): "CKTOTAL", "CKMB", "CKMBINDEX", "TROPONINI" in the last 168 hours. BNP (last 3 results) No results for input(s): "PROBNP" in the last 8760 hours. HbA1C: No results for input(s): "HGBA1C" in the last 72 hours. CBG: Recent Labs  Lab 02/11/23 1728  02/12/23 0733 02/12/23 1126 02/12/23 1636 02/12/23 2119  GLUCAP 105* 82 150* 115* 192*   Lipid Profile: No results for input(s): "CHOL", "HDL", "LDLCALC", "TRIG", "CHOLHDL", "LDLDIRECT" in the last 72 hours. Thyroid Function Tests: No results for input(s): "TSH", "T4TOTAL", "FREET4", "T3FREE", "THYROIDAB" in the last 72 hours. Anemia Panel: No results for input(s): "VITAMINB12", "FOLATE", "FERRITIN", "TIBC", "IRON", "RETICCTPCT"  in the last 72 hours. Urine analysis:    Component Value Date/Time   COLORURINE YELLOW 02/19/2021 0045   APPEARANCEUR HAZY (A) 02/19/2021 0045   APPEARANCEUR Clear 05/09/2020 1509   LABSPEC 1.013 02/19/2021 0045   PHURINE 5.0 02/19/2021 0045   GLUCOSEU >=500 (A) 02/19/2021 0045   HGBUR MODERATE (A) 02/19/2021 0045   BILIRUBINUR NEGATIVE 02/19/2021 0045   BILIRUBINUR Negative 05/09/2020 1509   KETONESUR NEGATIVE 02/19/2021 0045   PROTEINUR >=300 (A) 02/19/2021 0045   UROBILINOGEN 0.2 02/04/2020 1612   UROBILINOGEN 0.2 02/16/2014 0045   NITRITE NEGATIVE 02/19/2021 0045   LEUKOCYTESUR NEGATIVE 02/19/2021 0045   Sepsis Labs: @LABRCNTIP (procalcitonin:4,lacticidven:4)  )No results found for this or any previous visit (from the past 240 hour(s)).   Radiology Studies: No results found.   Scheduled Meds:  aspirin EC  81 mg Oral Daily   calcitRIOL  0.25 mcg Oral Q T,Th,Sat-1800   calcium acetate  2,001 mg Oral TID WC   calcium carbonate  800 mg of elemental calcium Oral TID   carvedilol  12.5 mg Oral BID WC   Chlorhexidine Gluconate Cloth  6 each Topical Q0600   heparin  5,000 Units Subcutaneous Q8H   hydrALAZINE  25 mg Oral Q8H   hydrocortisone cream   Topical BID   insulin aspart  0-6 Units Subcutaneous TID WC   insulin glargine-yfgn  7 Units Subcutaneous QHS   isosorbide mononitrate  60 mg Oral QHS   pantoprazole  40 mg Oral Daily   sodium chloride flush  3 mL Intravenous Q12H   Continuous Infusions:  sodium chloride     anticoagulant sodium citrate       LOS: 8 days    Time spent:    Zannie Cove, MD [facility]   02/13/2023, 12:37 PM

## 2023-02-14 DIAGNOSIS — Z992 Dependence on renal dialysis: Secondary | ICD-10-CM | POA: Diagnosis not present

## 2023-02-14 DIAGNOSIS — N186 End stage renal disease: Secondary | ICD-10-CM | POA: Diagnosis not present

## 2023-02-14 LAB — GLUCOSE, CAPILLARY
Glucose-Capillary: 88 mg/dL (ref 70–99)
Glucose-Capillary: 178 mg/dL — ABNORMAL HIGH (ref 70–99)

## 2023-02-14 MED ORDER — SIMETHICONE 80 MG PO CHEW
80.0000 mg | CHEWABLE_TABLET | Freq: Once | ORAL | Status: DC
  Filled 2023-02-14: qty 1

## 2023-02-14 NOTE — Plan of Care (Signed)

## 2023-02-14 NOTE — Progress Notes (Signed)
Subjective: Seen in room, no complaints, said tolerated dialysis yesterday, noted refused vitals this a.m.  Objective Vital signs in last 24 hours: Vitals:   02/13/23 1414 02/13/23 1900 02/14/23 0500 02/14/23 0637  BP:  (!) 140/66  130/64  Pulse:  76  72  Resp:    (!) 22  Temp:    99.2 F (37.3 C)  TempSrc:      SpO2:    (!) 68%  Weight: 75 kg  75.1 kg   Height:       Weight change:   Physical Exam: General: Alert adult male NAD Heart: RRR no MRG Lungs: CTA bilaterally nonlabored breathing Abdomen: NABS, slightly distended abdominal wall fluid ,nontender Extremities: 2+ bilateral pedal edema  Dialysis Access: RUE AV AVG positive bruit  OP HD: TTS [facility]  4h   400/800   61.5kg  2/2.5 bath  RFA AVG   Heparin 2000 - hep B labs done 9/13 - last OP HD 8/31, post wt 75kg - rocaltrol 0.25 mcg po three times per week - no esa, last Hb 12 on 8/31    CXR 9/17 - mild vasc congestion, no edema.      Assessment/ Plan: SOB/ acute on chronic volume overload - due to missing HD. Sig bilat LE edema on admission. Admit CXR w/o edema, no resp issues, on RA. Has had HD x 3  serial HD here.  Next dialysis tomorrow 9/28 ESRD - on HD TTS. Last OP HD was 8/31. Intermittently refusing HD in the hospital.Next HD 9/28 HTN - BP's have come down and coreg was reduced. On imdur as well.  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 Not on esa at OP unit. Follow.   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  Now at goal 2.8.  Resumed his po vdra and binder (phoslo).. Continue po vdra and Tums 500mg  2 tid DM2 - on insulin plan per admit  Douglas Pastel, PA-C [facility] Beeper [phone] 02/14/2023,10:29 AM  LOS: 9 days   Labs: Basic Metabolic Panel: Recent Labs  Lab 02/08/23 0958 02/12/23 0709 02/13/23 0940  NA 135 136 134*  K 3.7 4.1 4.4  CL 101 94* 98  CO2 22 22 24   GLUCOSE 116* 87 149*  BUN 67* 52* 69*  CREATININE 11.35* 8.79* 10.77*  CALCIUM 6.5* 8.2* 7.6*  PHOS 4.7* 2.8  --     Liver Function Tests: Recent Labs  Lab 02/08/23 0958 02/12/23 0709  ALBUMIN 2.9* 3.1*   No results for input(s): "LIPASE", "AMYLASE" in the last 168 hours. No results for input(s): "AMMONIA" in the last 168 hours. CBC: Recent Labs  Lab 02/13/23 0940  WBC 7.4  HGB 9.3*  HCT 30.9*  MCV 83.3  PLT 266   Cardiac Enzymes: No results for input(s): "CKTOTAL", "CKMB", "CKMBINDEX", "TROPONINI" in the last 168 hours. CBG: Recent Labs  Lab 02/12/23 1126 02/12/23 1636 02/12/23 2119 02/13/23 2003 02/14/23 0743  GLUCAP 150* 115* 192* 190* 88    Studies/Results: No results found. Medications:  sodium chloride      aspirin EC  81 mg Oral Daily   calcitRIOL  0.25 mcg Oral Q T,Th,Sat-1800   calcium acetate  2,001 mg Oral TID WC   calcium carbonate  800 mg of elemental calcium Oral TID   carvedilol  12.5 mg Oral BID WC   Chlorhexidine Gluconate Cloth  6 each Topical Q0600   heparin  5,000 Units Subcutaneous Q8H   hydrALAZINE  25 mg Oral  Q8H   hydrocortisone cream   Topical BID   insulin aspart  0-6 Units Subcutaneous TID WC   insulin glargine-yfgn  7 Units Subcutaneous QHS   isosorbide mononitrate  60 mg Oral QHS   pantoprazole  40 mg Oral Daily   simethicone  80 mg Oral Once   sodium chloride flush  3 mL Intravenous Q12H

## 2023-02-14 NOTE — Progress Notes (Signed)
Pt refused vitals this morning

## 2023-02-14 NOTE — Progress Notes (Signed)
PROGRESS NOTE    MORAN SIFUENTEZ  WUJ:[id number] DOB: 07-13-1970 DOA: 02/04/2023 PCP: [name]  [name] with history of chronic systolic CHF, ESRD, chronic respiratory failure on 2 to 3 L home O2 type 2 diabetes mellitus, hypertension, GERD presented to the ED with worsening lower extremity edema frequent hospitalizations for volume overload from missed HD, reports missing HD on account of transportation issues, he is homeless  Subjective: -Had some abdominal discomfort earlier today, having BMs, ate breakfast  Assessment and Plan:   ESRD on dialysis (HCC) Hypervolemia. Hypocalcemia.  -Improving, repeat HD tomorrow -Frequent hospitalizations for same, complicated by homelessness and transportation issues, compliance -TOC consult pending -Social issues with homelessness and transportation limit dialysis compliance  Chronic systolic CHF reduced EF 20 to 95% (congestive heart failure) (HCC) -Last echo 6/24 with a EF of 20-25%, severe LVH, severely reduced RV -continue Imdur and Coreg Volume managed with HD  Diabetes mellitus type 2, insulin dependent (HCC) -Stable, continue glargine and sliding scale  Uncontrolled hypertension -Improving, continue Imdur, hydralazine and carvedilol Ultrafiltration for volume management.   Anemia of chronic disease -Stable  GERD (gastroesophageal reflux disease) Continue PPI  Homelessness Poor compliance -TOC, social work consult  DVT prophylaxis: hep SQ Code Status: Full code Family Communication: None  Disposition Plan: Unknown  Consultants:    Procedures:   Antimicrobials:    Objective: Vitals:   02/13/23 1900 02/14/23 0500 02/14/23 0637 02/14/23 0800  BP: (!) 140/66  130/64   Pulse: 76  72   Resp:   (!) 22   Temp:   99.2 F (37.3 C)   TempSrc:      SpO2:   (!) 68% 98%  Weight:  75.1 kg    Height:        Intake/Output Summary (Last 24 hours) at 02/14/2023 1241 Last data filed at 02/14/2023 0845 Gross per 24 hour   Intake 600 ml  Output 5000 ml  Net -4400 ml   Filed Weights   02/13/23 0924 02/13/23 1414 02/14/23 0500  Weight: 80.1 kg 75 kg 75.1 kg    Examination:  Chronically ill male laying in bed, eyes closed, does not want to interact, irritable when I woke him up, AAOx3 HEENT: Positive JVD CVS: S1-S2, regular rhythm Lungs: Clear bilaterally Abdomen: Soft, nontender, bowel sounds present Extremities: 1+ chronic edema, right arm AV graft  Data Reviewed:   CBC: Recent Labs  Lab 02/13/23 0940  WBC 7.4  HGB 9.3*  HCT 30.9*  MCV 83.3  PLT 266   Basic Metabolic Panel: Recent Labs  Lab 02/08/23 0958 02/12/23 0709 02/13/23 0940  NA 135 136 134*  K 3.7 4.1 4.4  CL 101 94* 98  CO2 22 22 24   GLUCOSE 116* 87 149*  BUN 67* 52* 69*  CREATININE 11.35* 8.79* 10.77*  CALCIUM 6.5* 8.2* 7.6*  PHOS 4.7* 2.8  --    GFR: Estimated Creatinine Clearance: 7.8 mL/min (A) (by C-G formula based on SCr of 10.77 mg/dL (H)). Liver Function Tests: Recent Labs  Lab 02/08/23 0958 02/12/23 0709  ALBUMIN 2.9* 3.1*   No results for input(s): "LIPASE", "AMYLASE" in the last 168 hours. No results for input(s): "AMMONIA" in the last 168 hours. Coagulation Profile: No results for input(s): "INR", "PROTIME" in the last 168 hours. Cardiac Enzymes: No results for input(s): "CKTOTAL", "CKMB", "CKMBINDEX", "TROPONINI" in the last 168 hours. BNP (last 3 results) No results for input(s): "PROBNP" in the last 8760 hours. HbA1C: No results for input(s): "HGBA1C" in the last  72 hours. CBG: Recent Labs  Lab 02/12/23 1636 02/12/23 2119 02/13/23 2003 02/14/23 0743 02/14/23 1139  GLUCAP 115* 192* 190* 88 178*   Lipid Profile: No results for input(s): "CHOL", "HDL", "LDLCALC", "TRIG", "CHOLHDL", "LDLDIRECT" in the last 72 hours. Thyroid Function Tests: No results for input(s): "TSH", "T4TOTAL", "FREET4", "T3FREE", "THYROIDAB" in the last 72 hours. Anemia Panel: No results for input(s):  "VITAMINB12", "FOLATE", "FERRITIN", "TIBC", "IRON", "RETICCTPCT" in the last 72 hours. Urine analysis:    Component Value Date/Time   COLORURINE YELLOW 02/19/2021 0045   APPEARANCEUR HAZY (A) 02/19/2021 0045   APPEARANCEUR Clear 05/09/2020 1509   LABSPEC 1.013 02/19/2021 0045   PHURINE 5.0 02/19/2021 0045   GLUCOSEU >=500 (A) 02/19/2021 0045   HGBUR MODERATE (A) 02/19/2021 0045   BILIRUBINUR NEGATIVE 02/19/2021 0045   BILIRUBINUR Negative 05/09/2020 1509   KETONESUR NEGATIVE 02/19/2021 0045   PROTEINUR >=300 (A) 02/19/2021 0045   UROBILINOGEN 0.2 02/04/2020 1612   UROBILINOGEN 0.2 02/16/2014 0045   NITRITE NEGATIVE 02/19/2021 0045   LEUKOCYTESUR NEGATIVE 02/19/2021 0045   Sepsis Labs: @LABRCNTIP (procalcitonin:4,lacticidven:4)  )No results found for this or any previous visit (from the past 240 hour(s)).   Radiology Studies: No results found.   Scheduled Meds:  aspirin EC  81 mg Oral Daily   calcitRIOL  0.25 mcg Oral Q T,Th,Sat-1800   calcium acetate  2,001 mg Oral TID WC   calcium carbonate  800 mg of elemental calcium Oral TID   carvedilol  12.5 mg Oral BID WC   Chlorhexidine Gluconate Cloth  6 each Topical Q0600   heparin  5,000 Units Subcutaneous Q8H   hydrALAZINE  25 mg Oral Q8H   hydrocortisone cream   Topical BID   insulin aspart  0-6 Units Subcutaneous TID WC   insulin glargine-yfgn  7 Units Subcutaneous QHS   isosorbide mononitrate  60 mg Oral QHS   pantoprazole  40 mg Oral Daily   simethicone  80 mg Oral Once   sodium chloride flush  3 mL Intravenous Q12H   Continuous Infusions:  sodium chloride       LOS: 9 days    Time spent:    Zannie Cove, MD [facility]   02/14/2023, 12:41 PM

## 2023-02-15 ENCOUNTER — Emergency Department (HOSPITAL_COMMUNITY)
Admission: EM | Admit: 2023-02-15 | Discharge: 2023-02-16 | Discharge status: Against Medical Advice | Payer: 59 | Attending: Emergency Medicine | Admitting: Emergency Medicine

## 2023-02-15 ENCOUNTER — Other Ambulatory Visit (HOSPITAL_COMMUNITY): Payer: Self-pay

## 2023-02-15 DIAGNOSIS — E8779 Other fluid overload: Secondary | ICD-10-CM | POA: Diagnosis not present

## 2023-02-15 DIAGNOSIS — E8770 Fluid overload, unspecified: Secondary | ICD-10-CM | POA: Diagnosis not present

## 2023-02-15 DIAGNOSIS — Z5321 Procedure and treatment not carried out due to patient leaving prior to being seen by health care provider: Secondary | ICD-10-CM | POA: Diagnosis not present

## 2023-02-15 DIAGNOSIS — I5022 Chronic systolic (congestive) heart failure: Secondary | ICD-10-CM | POA: Diagnosis not present

## 2023-02-15 DIAGNOSIS — N250 Renal osteodystrophy: Secondary | ICD-10-CM | POA: Diagnosis not present

## 2023-02-15 DIAGNOSIS — Z992 Dependence on renal dialysis: Secondary | ICD-10-CM | POA: Diagnosis not present

## 2023-02-15 DIAGNOSIS — Z4931 Encounter for adequacy testing for hemodialysis: Secondary | ICD-10-CM | POA: Diagnosis present

## 2023-02-15 DIAGNOSIS — I120 Hypertensive chronic kidney disease with stage 5 chronic kidney disease or end stage renal disease: Secondary | ICD-10-CM | POA: Diagnosis not present

## 2023-02-15 DIAGNOSIS — N186 End stage renal disease: Secondary | ICD-10-CM | POA: Diagnosis not present

## 2023-02-15 DIAGNOSIS — D631 Anemia in chronic kidney disease: Secondary | ICD-10-CM | POA: Diagnosis not present

## 2023-02-15 MED ORDER — HYDRALAZINE HCL 25 MG PO TABS
25.0000 mg | ORAL_TABLET | Freq: Three times a day (TID) | ORAL | 0 refills | Status: DC
  Filled 2023-02-15: qty 90, 30d supply, fill #0

## 2023-02-15 MED ORDER — CARVEDILOL 12.5 MG PO TABS
12.5000 mg | ORAL_TABLET | Freq: Two times a day (BID) | ORAL | 0 refills | Status: DC
  Filled 2023-02-15: qty 60, 30d supply, fill #0

## 2023-02-15 MED ORDER — HEPARIN SODIUM (PORCINE) 1000 UNIT/ML IJ SOLN: 2000.0000 [IU] | Freq: Once | INTRAMUSCULAR | Status: DC

## 2023-02-15 MED ORDER — HEPARIN SODIUM (PORCINE) 1000 UNIT/ML DIALYSIS
2000.0000 [IU] | Freq: Once | INTRAMUSCULAR | Status: DC
  Filled 2023-02-15 (×2): qty 2

## 2023-02-15 NOTE — Progress Notes (Signed)
Subjective: Patient seen in room, no complaints, noted refused dialysis today a.m.  I had discussion with him about need for dialysis if he wants to stop dialysis we can consult hospice.  He states he will continue  HD "do dialysis today"  Objective Vital signs in last 24 hours: Vitals:   02/14/23 0637 02/14/23 0800 02/14/23 1829 02/15/23 0531  BP: 130/64  135/63 (!) 153/83  Pulse: 72  82 72  Resp: (!) 22  16 16   Temp: 99.2 F (37.3 C)  99.1 F (37.3 C) 98.4 F (36.9 C)  TempSrc:    Oral  SpO2: (!) 68% 98% (!) 85% 100%  Weight:      Height:       Weight change:    Physical Exam: General: Alert adult male NAD Heart: RRR no MRG Lungs: CTA bilaterally nonlabored breathing Abdomen: NABS, slightly distended abdominal wall fluid ,nontender Extremities: 2+ bilateral pedal edema  Dialysis Access: RUE AV AVG positive bruit   OP HD: TTS [facility]  4h   400/800   61.5kg  2/2.5 bath  RFA AVG   Heparin 2000 - hep B labs done 9/13 - last OP HD 8/31, post wt 75kg - rocaltrol 0.25 mcg po three times per week - no esa, last Hb 12 on 8/31    CXR 9/17 - mild vasc congestion, no edema.      Assessment/ Plan: SOB/ acute on chronic volume overload - due to missing HD. Sig bilat LE edema on admission. Admit CXR w/o edema, no resp issues, on RA. Has had HD x 3  serial HD here.  Next dialysis today 9/28 ESRD - on HD TTS. Last OP HD was 8/31. Intermittently refusing HD in the hospital.Next HD 9/28 HTN - BP's have come down and coreg was reduced. On imdur as well.  Anemia esrd - Hb 9/26= 9.3 not on esa at OP unit. Follow if continues low start ESA MBD ckd - CCa 8.1,admit phos was a bit high.  Now at goal 2.8.  Resumed his po vdra and binder (phoslo).. Continue po vdra and Tums 500mg  2 tid DM2 - on insulin plan per admit Disposition= stable for dialysis when outpatient stable living condition found SW managing  Lenny Pastel, PA-C [facility] Beeper [phone] 02/15/2023,10:46 AM  LOS:  10 days   Labs: Basic Metabolic Panel: Recent Labs  Lab 02/12/23 0709 02/13/23 0940  NA 136 134*  K 4.1 4.4  CL 94* 98  CO2 22 24  GLUCOSE 87 149*  BUN 52* 69*  CREATININE 8.79* 10.77*  CALCIUM 8.2* 7.6*  PHOS 2.8  --    Liver Function Tests: Recent Labs  Lab 02/12/23 0709  ALBUMIN 3.1*   No results for input(s): "LIPASE", "AMYLASE" in the last 168 hours. No results for input(s): "AMMONIA" in the last 168 hours. CBC: Recent Labs  Lab 02/13/23 0940  WBC 7.4  HGB 9.3*  HCT 30.9*  MCV 83.3  PLT 266   Cardiac Enzymes: No results for input(s): "CKTOTAL", "CKMB", "CKMBINDEX", "TROPONINI" in the last 168 hours. CBG: Recent Labs  Lab 02/12/23 1636 02/12/23 2119 02/13/23 2003 02/14/23 0743 02/14/23 1139  GLUCAP 115* 192* 190* 88 178*    Studies/Results: No results found. Medications:  sodium chloride      aspirin EC  81 mg Oral Daily   calcitRIOL  0.25 mcg Oral Q T,Th,Sat-1800   calcium acetate  2,001 mg Oral TID WC   calcium carbonate  800 mg of elemental calcium Oral  TID   carvedilol  12.5 mg Oral BID WC   Chlorhexidine Gluconate Cloth  6 each Topical Q0600   heparin  2,000 Units Dialysis Once in dialysis   heparin  5,000 Units Subcutaneous Q8H   heparin sodium (porcine)  2,000 Units Intracatheter Once   hydrALAZINE  25 mg Oral Q8H   hydrocortisone cream   Topical BID   insulin aspart  0-6 Units Subcutaneous TID WC   insulin glargine-yfgn  7 Units Subcutaneous QHS   isosorbide mononitrate  60 mg Oral QHS   pantoprazole  40 mg Oral Daily   simethicone  80 mg Oral Once   sodium chloride flush  3 mL Intravenous Q12H

## 2023-02-15 NOTE — Progress Notes (Signed)
Call to patient's room.  A scabbed area on his RT Arm AVG was off.  The area had a small oozing of blood.  Several 2 x 2 gauze was applied to area and paper tape applied.  Advised patient not to take off and to call me if the gauze becomes saturated.  Good Bruit and Thrill.  Will continue to monitor.  Bernie Covey RN

## 2023-02-15 NOTE — Discharge Summary (Signed)
Physician Discharge Summary  [name] UXN:[id number] DOB: 1971-04-06 DOA: 02/04/2023  PCP: [name]  Admit date: 02/04/2023 Discharge date: 02/15/2023  Time spent: 45 minutes  Recommendations for Outpatient Follow-up:  Encouraged compliance with outpatient dialysis High risk of quick readmission   Discharge Diagnoses:  Volume overload   ESRD on dialysis [facility]) Noncompliance Homelessness   Chronic systolic CHF reduced EF 20 to 25% (congestive heart failure) (HCC)   Diabetes mellitus type 2, insulin dependent (HCC)   Essential hypertension   Microcytic anemia   GERD (gastroesophageal reflux disease)   Discharge Condition: Improved  Diet recommendation: Renal, diabetic  Filed Weights   02/13/23 0924 02/13/23 1414 02/14/23 0500  Weight: 80.1 kg 75 kg 75.1 kg    History of present illness:  [name] with history of chronic systolic CHF, ESRD, chronic respiratory failure on 2 to 3 L home O2 type 2 diabetes mellitus, hypertension, GERD presented to the ED with worsening lower extremity edema frequent hospitalizations for volume overload from missed HD, reports missing HD on account of transportation issues, he is homeless   Hospital Course:   ESRD on dialysis (HCC) Hypervolemia. Hypocalcemia.  -Volume status improved with extra hemodialysis inpatient -Frequent hospitalizations for same, complicated by homelessness and transportation issues, compliance. -Has exhibited poor compliance with dialysis in the hospital as well -Seen by social worker in consultation, longstanding homelessness, recently kicked out of [place] house, has disability income but declines the possibility of paying for a motel as it takes all his money.  Social work has arranged transportation for dialysis and medical appts.  Current options for discharge or IRC or a motel, he will discharge after dialysis today   Chronic systolic CHF reduced EF 20 to 42% (congestive heart failure) (HCC) -Last echo 6/24  with a EF of 20-25%, severe LVH, severely reduced RV -continue Imdur and Coreg Volume managed with HD   Diabetes mellitus type 2, insulin dependent (HCC) -Stable, continue glargine and sliding scale   Uncontrolled hypertension -Improving, continue Imdur, hydralazine and carvedilol Ultrafiltration for volume management.  -Meds sent to [facility]   Anemia of chronic disease -Stable   GERD (gastroesophageal reflux disease) Continue PPI   Homelessness Poor compliance -See discussion above    Discharge Exam: Vitals:   02/14/23 1829 02/15/23 0531  BP:  (!) 153/83  Pulse: 82 72  Resp: 16 16  Temp: 99.1 F (37.3 C) 98.4 F (36.9 C)  SpO2: (!) 85% 100%   Chronically ill male laying in bed, eyes closed, , AAOx3, limited interaction HEENT: Positive JVD CVS: S1-S2, regular rhythm Lungs: Clear bilaterally Abdomen: Soft, nontender, bowel sounds present Extremities: 1+ chronic edema, right arm AV graft   Discharge Instructions   Discharge Instructions     Discharge instructions   Complete by: As directed    Renal, diabetic diet   Increase activity slowly   Complete by: As directed    No wound care   Complete by: As directed       Allergies as of 02/15/2023       Reactions   Sulfa Antibiotics Rash   Severe rash. Severe rash.     Severe rash.   Other Other (See Comments)   Patient states he is allergic to the TB test - unknown reaction during childhood    Bactrim [sulfamethoxazole-trimethoprim] Rash   Cephalexin Other (See Comments), Rash   Patient with severe drug reaction including exfoliating skin rash and hypotension after being prescribed both cephalexin and sulfamethoxazole-trimethoprim simultaneously. Favor SMX as much  more likely culprit as patient had tolerated other cephalosporins in the past, but cannot say with complete certainty that this was not related to cephalexin. Other Reaction(s): Other, Other (See Comments) Patient with severe drug reaction including  exfoliating skin rash and hypotension after being prescribed both cephalexin and sulfamethoxazole-trimethoprim simultaneously. Favor SMX as much more likely culprit as patient had tolerated other cephalosporins in the past, but cannot say with complete certainty that this was not related to cephalexin.   "Patient with severe drug reaction including exfoliating skin rash and hypotension after being prescribed both cephalexin and sulfamethoxazole-trimethoprim simultaneously. Favor SMX as much more likely culprit as patient had tolerated other cephalosporins in the past, but cannot say with complete certainty that this was not related to cephalexin." As noted in [facility]   "Patient with severe drug reaction including exfoliating skin rash and hypotension after being prescribed both cephalexin and sulfamethoxazole-trimethoprim simultaneously. Favor SMX as much more likely culprit as patient had tolerated other cephalosporins in the past, but cannot say with complete certainty that this was not related to cephalexin." As noted in [facility]  "Patient with severe drug reaction including exfoliating skin rash and hypotension after being prescribed both cephalexin and sulfamethoxazole-trimethoprim simultaneously. Favor SMX as much more likely culprit as patient had tolerated other cephalosporins in the past, but cannot say with complete certainty that this was not related to cephalexin." As noted in [facility]     Patient with severe drug reaction including exfoliating skin rash and hypotension after being prescribed both cephalexin and sulfamethoxazole-trimethoprim simultaneously. Favor SMX as much more likely culprit as patient had tolerated other cephalosporins in the past, but cannot say with complete certainty that this was not related to cephalexin.  "Patient with severe drug reaction including exfoliating skin rash and hypotension after being prescribed both cephalexin and sulfamethoxazole-trimethoprim  simultaneously. Favor SMX as much more likely culprit as patient had tolerated other cephalosporins in the past, but cannot say with complete certainty that this was not related to cephalexin." As noted in [facility]  "Patient with severe drug reaction including exfoliating skin rash and hypotension after being prescribed both cephalexin and sulfamethoxazole-trimethoprim simultaneously. Favor SMX as much more likely culprit as patient had tolerated other cephalosporins in the past, but cannot say with complete certainty that this was not related to cephalexin." As noted in [facility]     Patient with severe drug reaction inc... (TRUNCATED)        Medication List     STOP taking these medications    benzonatate 100 MG capsule Commonly known as: TESSALON   doxycycline 100 MG capsule Commonly known as: VIBRAMYCIN   oxyCODONE-acetaminophen 5-325 MG tablet Commonly known as: PERCOCET/ROXICET       TAKE these medications    Accu-Chek Guide test strip Generic drug: glucose blood Use to check fasting blood sugar once daily. 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   Aspirin Low Dose 81 MG tablet Generic drug: aspirin EC Take 1 tablet (81 mg total) by mouth daily.   calcitRIOL 0.25 MCG capsule Commonly known as: ROCALTROL Take 1 capsule (0.25 mcg total) by mouth every Tuesday, Thursday, and Saturday at 6 PM.   calcium acetate 667 MG tablet Commonly known as: PHOSLO Take 3 tablets (2,001 mg total) by mouth 3 (three) times daily.   carvedilol 12.5 MG tablet Commonly known  as: COREG Take 1 tablet (12.5 mg total) by mouth 2 (two) times daily with a meal. What changed:  medication strength how much to take Another medication with the same name was removed. Continue taking this medication, and follow the directions you see here.   HumaLOG KwikPen 100  UNIT/ML KwikPen Generic drug: insulin lispro Inject into the skin.   hydrALAZINE 25 MG tablet Commonly known as: APRESOLINE Take 1 tablet (25 mg total) by mouth 3 (three) times daily.   insulin glargine-yfgn 100 UNIT/ML injection Commonly known as: SEMGLEE Inject 0.07 mLs (7 Units total) into the skin at bedtime.   isosorbide mononitrate 60 MG 24 hr tablet Commonly known as: IMDUR Take 1 tablet (60 mg total) by mouth daily. What changed: Another medication with the same name was removed. Continue taking this medication, and follow the directions you see here.   melatonin 3 MG Tabs tablet Take 1 tablet (3 mg total) by mouth at bedtime as needed (insomnia).   mupirocin ointment 2 % Commonly known as: BACTROBAN Apply 1 Application topically 3 (three) times daily.   NovoLOG FlexPen 100 UNIT/ML FlexPen Generic drug: insulin aspart 0-9 Units, Subcutaneous, 3 times daily with meals CBG < 70: Implement Hypoglycemia measures CBG 70 - 120: 0 units CBG 121 - 150: 1 unit CBG 151 - 200: 2 units CBG 201 - 250: 3 units CBG 251 - 300: 5 units CBG 301 - 350: 7 units CBG 351 - 400: 9 units CBG > 400: call MD   Protonix 40 MG tablet Generic drug: pantoprazole Take 1 tablet (40 mg total) by mouth daily.   simethicone 80 MG chewable tablet Commonly known as: MYLICON Chew 1 tablet (80 mg total) by mouth 4 (four) times daily for 5 days.   TechLite Pen Needles 32G X 4 MM Misc Generic drug: Insulin Pen Needle Use with insulin pens       Allergies  Allergen Reactions   Sulfa Antibiotics Rash    Severe rash.  Severe rash.     Severe rash.   Other Other (See Comments)    Patient states he is allergic to the TB test - unknown reaction during childhood    Bactrim [Sulfamethoxazole-Trimethoprim] Rash   Cephalexin Other (See Comments) and Rash    Patient with severe drug reaction including exfoliating skin rash and hypotension after being prescribed both cephalexin and  sulfamethoxazole-trimethoprim simultaneously. Favor SMX as much more likely culprit as patient had tolerated other cephalosporins in the past, but cannot say with complete certainty that this was not related to cephalexin.  Other Reaction(s): Other, Other (See Comments)  Patient with severe drug reaction including exfoliating skin rash and hypotension after being prescribed both cephalexin and sulfamethoxazole-trimethoprim simultaneously. Favor SMX as much more likely culprit as patient had tolerated other cephalosporins in the past, but cannot say with complete certainty that this was not related to cephalexin.   "Patient with severe drug reaction including exfoliating skin rash and hypotension after being prescribed both cephalexin and sulfamethoxazole-trimethoprim simultaneously. Favor SMX as much more likely culprit as patient had tolerated other cephalosporins in the past, but cannot say with complete certainty that this was not related to cephalexin." As noted in [facility]   "Patient with severe drug reaction including exfoliating skin rash and hypotension after being prescribed both cephalexin and sulfamethoxazole-trimethoprim simultaneously. Favor SMX as much more likely culprit as patient had tolerated other cephalosporins in the past, but cannot say with complete certainty that this was not related to cephalexin."  As noted in [facility]   "Patient with severe drug reaction including exfoliating skin rash and hypotension after being prescribed both cephalexin and sulfamethoxazole-trimethoprim simultaneously. Favor SMX as much more likely culprit as patient had tolerated other cephalosporins in the past, but cannot say with complete certainty that this was not related to cephalexin." As noted in [facility]     Patient with severe drug reaction including exfoliating skin rash and hypotension after being prescribed both cephalexin and sulfamethoxazole-trimethoprim simultaneously. Favor SMX as  much more likely culprit as patient had tolerated other cephalosporins in the past, but cannot say with complete certainty that this was not related to cephalexin.  "Patient with severe drug reaction including exfoliating skin rash and hypotension after being prescribed both cephalexin and sulfamethoxazole-trimethoprim simultaneously. Favor SMX as much more likely culprit as patient had tolerated other cephalosporins in the past, but cannot say with complete certainty that this was not related to cephalexin." As noted in [facility]  "Patient with severe drug reaction including exfoliating skin rash and hypotension after being prescribed both cephalexin and sulfamethoxazole-trimethoprim simultaneously. Favor SMX as much more likely culprit as patient had tolerated other cephalosporins in the past, but cannot say with complete certainty that this was not related to cephalexin." As noted in [facility]     Patient with severe drug reaction inc... (TRUNCATED)      The results of significant diagnostics from this hospitalization (including imaging, microbiology, ancillary and laboratory) are listed below for reference.    Significant Diagnostic Studies: DG Chest 2 View  Result Date: 02/04/2023 CLINICAL DATA:  Short of breath EXAM: CHEST - 2 VIEW COMPARISON:  01/30/2023 FINDINGS: Frontal and lateral views of the chest demonstrates stable enlargement of the cardiac silhouette. Persistent central vascular congestion without airspace disease, effusion, or pneumothorax. No acute bony abnormality. IMPRESSION: 1. Stable enlarged cardiac silhouette and pulmonary vascular congestion. No overt edema. Electronically Signed   By: Sharlet Salina M.D.   On: 02/04/2023 22:08   DG Chest 2 View  Result Date: 01/30/2023 CLINICAL DATA:  Exertional shortness of breath. EXAM: CHEST - 2 VIEW COMPARISON:  January 13, 2023 FINDINGS: There is stable moderate severity cardiac silhouette enlargement. Mild to moderate severity  prominence of the central pulmonary vasculature is seen. Mild, chronic appearing increased lung markings are noted. Mild atelectasis is seen within the mid left lung. No pleural effusion or pneumothorax is identified. A chronic fracture deformity is seen involving the distal left clavicle. IMPRESSION: 1. Stable cardiomegaly with mild to moderate severity central pulmonary vascular congestion. Electronically Signed   By: Aram Candela M.D.   On: 01/30/2023 20:42    Microbiology: No results found for this or any previous visit (from the past 240 hour(s)).   Labs: Basic Metabolic Panel: Recent Labs  Lab 02/12/23 0709 02/13/23 0940  NA 136 134*  K 4.1 4.4  CL 94* 98  CO2 22 24  GLUCOSE 87 149*  BUN 52* 69*  CREATININE 8.79* 10.77*  CALCIUM 8.2* 7.6*  PHOS 2.8  --    Liver Function Tests: Recent Labs  Lab 02/12/23 0709  ALBUMIN 3.1*   No results for input(s): "LIPASE", "AMYLASE" in the last 168 hours. No results for input(s): "AMMONIA" in the last 168 hours. CBC: Recent Labs  Lab 02/13/23 0940  WBC 7.4  HGB 9.3*  HCT 30.9*  MCV 83.3  PLT 266   Cardiac Enzymes: No results for input(s): "CKTOTAL", "CKMB", "CKMBINDEX", "TROPONINI" in the last 168 hours. BNP: BNP (last  3 results) Recent Labs    01/04/23 1628 01/31/23 0835 02/04/23 2125  BNP >4,500.0* >4,500.0* >4,500.0*    ProBNP (last 3 results) No results for input(s): "PROBNP" in the last 8760 hours.  CBG: Recent Labs  Lab 02/12/23 1636 02/12/23 2119 02/13/23 2003 02/14/23 0743 02/14/23 1139  GLUCAP 115* 192* 190* 88 178*       Signed:  Zannie Cove MD.  [facility] 02/15/2023, 11:53 AM

## 2023-02-15 NOTE — Progress Notes (Signed)
   02/14/23 2356  Provider Notification  Provider Name/Title Dr. Loney Loh  Date Provider Notified 02/14/23  Time Provider Notified 2356  Method of Notification Page  Notification Reason Red med refusal  Provider response No new orders   Patient continues to refuse all medications, VS, CBG, labs, and insulin.  He only wants something to drink and something to eat.  Otherwise, he will ignore you and refuse to answer you questions.  Dr. Loney Loh made aware.  Will continue to monitor patient.  Bernie Covey RN

## 2023-02-15 NOTE — Progress Notes (Signed)
Patient refused AM blood sugar.

## 2023-02-15 NOTE — Progress Notes (Signed)
Patient refused AM dialysis session. MD aware. Spoke to dialysis nurse who will attempt to take him later in the day.

## 2023-02-15 NOTE — Progress Notes (Signed)
Patient agreeable with this RN to receive dialysis this afternoon and then voiced his understanding that the plan is to discharge him afterwards. Per the dialysis nurse, when the patient arrived he refused treatment but wouldn't elaborate further on why. Patient is known to be non-compliant with dialysis. MD aware. Patient will be discharged this afternoon.

## 2023-02-15 NOTE — ED Triage Notes (Signed)
Patient is here for his routine hemodialysis treatment , denies pain/respirations unlabored .

## 2023-02-15 NOTE — Progress Notes (Signed)
Pt arrived to the HD unit for his scheduled HD tx---pt refuses to follow any commands or allow [place] to put electrodes or bp cuff on him----refuses to open his eyes or position so that we could even try to get to his access arm--"send me back to my room---stop touching me" When attempting to discuss with this pt about how they may want to discharge him after his HD tx--pt states " oh no they cannot discharge me --I have to get treatments"---explained to him again that he is in the HD unit right now to get that tx---refused to do anything again--Dr. Valentino Nose notified--order given to send pt back to floor--primary RN notified---pt returned to floor via transportation--

## 2023-02-15 NOTE — TOC Progression Note (Addendum)
Transition of Care [facility]) - Progression Note    Patient Details  Name: [name] MRN: [id number] Date of Birth: [birth year]  Transition of Care [facility]) CM/SW Contact  Ronny Bacon, RN Phone Number: 02/15/2023, 9:10 AM  Clinical Narrative:   Message received from floor nurse about patient needing a discharge plan. Per note from [facility] nurse, patient told them that [facility] SW was assisting with finding patient a place to go. Included weekend covering SW to get an update. No SW is working on finding patient a place to go. Patient was given 6 bus passes last admission to assist with getting to dialysis. Note in chart from Renal navigator that patient has transportation to and from dialysis through [employer] [phone]).          Expected Discharge Plan and Services                                               Social Determinants of Health (SDOH) Interventions SDOH Screenings   Food Insecurity: Food Insecurity Present (01/31/2023)  Housing: Medium Risk (01/31/2023)  Transportation Needs: No Transportation Needs (01/31/2023)  Utilities: Not At Risk (01/31/2023)  Depression (PHQ2-9): Low Risk  (10/13/2019)  Financial Resource Strain: High Risk (09/24/2022)   Received from [facility] Subsidiaries and Affiliates, [facility] and Its Subsidiaries and Affiliates  Stress: Stress Concern Present (09/24/2022)   Received from [facility] Its Subsidiaries and Affiliates, [facility] and Its Subsidiaries and Affiliates  Tobacco Use: Low Risk  (02/05/2023)    Readmission Risk Interventions    01/31/2023    2:35 PM 10/22/2022    2:17 PM 11/07/2021    4:21 PM  Readmission Risk Prevention Plan  Transportation Screening Complete Complete Complete  Medication Review Oceanographer) Complete Complete Complete  PCP or Specialist appointment within 3-5 days of discharge Complete Complete Complete  HRI or Home Care Consult  Complete Complete Complete  SW Recovery Care/Counseling Consult Complete Complete Complete  Palliative Care Screening Not Applicable Not Applicable Not Applicable  Skilled Nursing Facility Not Applicable Complete Not Applicable

## 2023-02-15 NOTE — Plan of Care (Signed)
  Problem: Skin Integrity: Goal: Risk for impaired skin integrity will decrease Outcome: Adequate for Discharge   Problem: Tissue Perfusion: Goal: Adequacy of tissue perfusion will improve Outcome: Adequate for Discharge   Problem: Clinical Measurements: Goal: Ability to maintain clinical measurements within normal limits will improve Outcome: Adequate for Discharge Goal: Will remain free from infection Outcome: Adequate for Discharge Goal: Respiratory complications will improve Outcome: Adequate for Discharge Goal: Cardiovascular complication will be avoided Outcome: Adequate for Discharge   Problem: Activity: Goal: Risk for activity intolerance will decrease Outcome: Adequate for Discharge   Problem: Nutrition: Goal: Adequate nutrition will be maintained Outcome: Adequate for Discharge   Problem: Coping: Goal: Level of anxiety will decrease Outcome: Adequate for Discharge   Problem: Elimination: Goal: Will not experience complications related to bowel motility Outcome: Adequate for Discharge Goal: Will not experience complications related to urinary retention Outcome: Adequate for Discharge   Problem: Pain Managment: Goal: General experience of comfort will improve Outcome: Adequate for Discharge   Problem: Safety: Goal: Ability to remain free from injury will improve Outcome: Adequate for Discharge   Problem: Skin Integrity: Goal: Risk for impaired skin integrity will decrease Outcome: Adequate for Discharge

## 2023-02-15 NOTE — TOC Progression Note (Signed)
Transition of Care [facility]) - Progression Note    Patient Details  Name: [name] MRN: [id number] Date of Birth: Feb 28, 1971  Transition of Care [facility]) CM/SW Contact  Dellie Burns [place], [place] Phone Number: 02/15/2023, 10:01 AM  Clinical Narrative:   Met with pt re dc planning. Pt is well known to TOC, has a history of noncompliance with HD, homelessness. Pt states he was "kicked out" of [employer], declined to give the reason. Discussed option of IRC, pt is aware of this resource and declines. Pt confirms he receives disability income. Discussed pt paying for a motel, he states this is a possibility but paying for a motel "takes all my money." Pt refuses SNF for LTC. Pt confirms he left the last SNF he was placed at [facility] as he does not want to give up his disability income. Pt ambulating independently in room. Pt has Nurse, mental health for dialysis and medical appointments. Current dc options are the [facility] or a motel, pt verbalized understanding. MD and RN updated.   Dellie Burns, MSW, LCSW [phone] (coverage)           Expected Discharge Plan and Services                                               Social Determinants of Health (SDOH) Interventions SDOH Screenings   Food Insecurity: Food Insecurity Present (01/31/2023)  Housing: Medium Risk (01/31/2023)  Transportation Needs: No Transportation Needs (01/31/2023)  Utilities: Not At Risk (01/31/2023)  Depression (PHQ2-9): Low Risk  (10/13/2019)  Financial Resource Strain: High Risk (09/24/2022)   Received from [facility] Subsidiaries [facility] Affiliates, [facility] Subsidiaries and Affiliates  Stress: Stress Concern Present (09/24/2022)   Received from [facility] Subsidiaries and Affiliates, [facility] and Its Subsidiaries and Affiliates  Tobacco Use: Low Risk  (02/05/2023)    Readmission Risk Interventions    01/31/2023    2:35 PM 10/22/2022     2:17 PM 11/07/2021    4:21 PM  Readmission Risk Prevention Plan  Transportation Screening Complete Complete Complete  Medication Review (RN Care Manager) Complete Complete Complete  PCP or Specialist appointment within 3-5 days of discharge Complete Complete Complete  HRI or Home Care Consult Complete Complete Complete  SW Recovery Care/Counseling Consult Complete Complete Complete  Palliative Care Screening Not Applicable Not Applicable Not Applicable  Skilled Nursing Facility Not Applicable Complete Not Applicable

## 2023-02-15 NOTE — Progress Notes (Signed)
Discharge orders received. AVS printed and given to patient. Medications returned to patient from pharmacy. Patient provided bus pass for transportation from the facility.

## 2023-02-15 NOTE — Progress Notes (Signed)
HD progress note: - noted from primary RN that patient refused HD today.

## 2023-02-16 ENCOUNTER — Emergency Department (HOSPITAL_COMMUNITY): Payer: 59

## 2023-02-16 ENCOUNTER — Encounter (HOSPITAL_COMMUNITY): Payer: Self-pay | Admitting: Emergency Medicine

## 2023-02-16 ENCOUNTER — Emergency Department (HOSPITAL_COMMUNITY)
Admission: EM | Admit: 2023-02-16 | Discharge: 2023-02-18 | Disposition: A | Discharge status: Home / Self Care | Payer: 59 | Source: Home / Self Care | Attending: Emergency Medicine | Admitting: Emergency Medicine

## 2023-02-16 ENCOUNTER — Other Ambulatory Visit: Payer: Self-pay

## 2023-02-16 DIAGNOSIS — I120 Hypertensive chronic kidney disease with stage 5 chronic kidney disease or end stage renal disease: Secondary | ICD-10-CM | POA: Diagnosis not present

## 2023-02-16 DIAGNOSIS — Z992 Dependence on renal dialysis: Secondary | ICD-10-CM | POA: Insufficient documentation

## 2023-02-16 DIAGNOSIS — R140 Abdominal distension (gaseous): Secondary | ICD-10-CM | POA: Insufficient documentation

## 2023-02-16 DIAGNOSIS — N186 End stage renal disease: Secondary | ICD-10-CM | POA: Insufficient documentation

## 2023-02-16 DIAGNOSIS — E8770 Fluid overload, unspecified: Secondary | ICD-10-CM | POA: Diagnosis not present

## 2023-02-16 DIAGNOSIS — Z794 Long term (current) use of insulin: Secondary | ICD-10-CM | POA: Insufficient documentation

## 2023-02-16 DIAGNOSIS — Z7982 Long term (current) use of aspirin: Secondary | ICD-10-CM | POA: Insufficient documentation

## 2023-02-16 DIAGNOSIS — E861 Hypovolemia: Secondary | ICD-10-CM | POA: Insufficient documentation

## 2023-02-16 LAB — CBC WITH DIFFERENTIAL/PLATELET
Abs Immature Granulocytes: 0.01 10*3/uL (ref 0.00–0.07)
Basophils Absolute: 0 10*3/uL (ref 0.0–0.1)
Basophils Relative: 1 %
Eosinophils Absolute: 0.2 10*3/uL (ref 0.0–0.5)
Eosinophils Relative: 3 %
HCT: 39.4 % (ref 39.0–52.0)
Hemoglobin: 11.5 g/dL — ABNORMAL LOW (ref 13.0–17.0)
Immature Granulocytes: 0 %
Lymphocytes Relative: 12 %
Lymphs Abs: 0.8 10*3/uL (ref 0.7–4.0)
MCH: 24.2 pg — ABNORMAL LOW (ref 26.0–34.0)
MCHC: 29.2 g/dL — ABNORMAL LOW (ref 30.0–36.0)
MCV: 82.9 fL (ref 80.0–100.0)
Monocytes Absolute: 0.8 10*3/uL (ref 0.1–1.0)
Monocytes Relative: 13 %
Neutro Abs: 4.7 10*3/uL (ref 1.7–7.7)
Neutrophils Relative %: 71 %
Platelets: 299 10*3/uL (ref 150–400)
RBC: 4.75 MIL/uL (ref 4.22–5.81)
RDW: 20.6 % — ABNORMAL HIGH (ref 11.5–15.5)
WBC: 6.5 10*3/uL (ref 4.0–10.5)
nRBC: 0 % (ref 0.0–0.2)

## 2023-02-16 LAB — COMPREHENSIVE METABOLIC PANEL
ALT: 23 U/L (ref 0–44)
AST: 28 U/L (ref 15–41)
Albumin: 3.4 g/dL — ABNORMAL LOW (ref 3.5–5.0)
Alkaline Phosphatase: 200 U/L — ABNORMAL HIGH (ref 38–126)
Anion gap: 15 (ref 5–15)
BUN: 87 mg/dL — ABNORMAL HIGH (ref 6–20)
CO2: 21 mmol/L — ABNORMAL LOW (ref 22–32)
Calcium: 8 mg/dL — ABNORMAL LOW (ref 8.9–10.3)
Chloride: 97 mmol/L — ABNORMAL LOW (ref 98–111)
Creatinine, Ser: 10.27 mg/dL — ABNORMAL HIGH (ref 0.61–1.24)
GFR, Estimated: 6 mL/min — ABNORMAL LOW (ref >60)
Glucose, Bld: 174 mg/dL — ABNORMAL HIGH (ref 70–99)
Potassium: 4.8 mmol/L (ref 3.5–5.1)
Sodium: 133 mmol/L — ABNORMAL LOW (ref 135–145)
Total Bilirubin: 0.4 mg/dL (ref 0.3–1.2)
Total Protein: 8.3 g/dL — ABNORMAL HIGH (ref 6.5–8.1)

## 2023-02-16 NOTE — ED Provider Notes (Signed)
[place] EMERGENCY DEPARTMENT AT [facility] Provider Note   CSN: [id number] Arrival date & time: 02/16/23  1604     History  Chief Complaint  Patient presents with   fluid     [name] is a 52 y.o. male.  Patient here complaining of being fluid overloaded.  Patient reports that he needs dialysis.  Patient has a history of noncompliance with dialysis outpatient.  Patient receives dialysis at [facility] frequently patient was last dialyzed on Thursday.  Patient states he does not feel short of breath but he feels like his abdomen is swollen.  Patient complains of swelling in his legs.   The history is provided by the patient.       Home Medications Prior to Admission medications   Medication Sig Start Date End Date Taking? Authorizing Provider  acetaminophen (TYLENOL) 500 MG tablet Take 1 tablet (500 mg total) by mouth every 8 (eight) hours as needed for mild pain. 05/28/22   Leroy Sea, MD  ASPIRIN LOW DOSE 81 MG tablet Take 1 tablet (81 mg total) by mouth daily. 05/28/22   Leroy Sea, MD  calcitRIOL (ROCALTROL) 0.25 MCG capsule Take 1 capsule (0.25 mcg total) by mouth every Tuesday, Thursday, and Saturday at 6 PM. 10/26/22   Ghimire, Werner Lean, MD  calcium acetate (PHOSLO) 667 MG tablet Take 3 tablets (2,001 mg total) by mouth 3 (three) times daily. 12/12/22     carvedilol (COREG) 12.5 MG tablet Take 1 tablet (12.5 mg total) by mouth 2 (two) times daily with a meal. 02/15/23   Zannie Cove, MD  glucose blood test strip Use to check fasting blood sugar once daily. 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 10/26/22   [provider]  hydrALAZINE (APRESOLINE) 25 MG tablet Take 1 tablet (25 mg total) by mouth 3 (three) times daily. 02/15/23   Zannie Cove, MD  insulin aspart (NOVOLOG FLEXPEN) 100 UNIT/ML FlexPen 0-9 Units, Subcutaneous, 3 times daily with meals  CBG < 70: Implement Hypoglycemia measures CBG 70 - 120: 0 units CBG 121 - 150: 1 unit CBG 151 - 200: 2 units CBG 201 - 250: 3 units CBG 251 - 300: 5 units CBG 301 - 350: 7 units CBG 351 - 400: 9 units CBG > 400: call MD 10/26/22   Maretta Bees, MD  insulin glargine-yfgn (SEMGLEE) 100 UNIT/ML injection Inject 0.07 mLs (7 Units total) into the skin at bedtime. 10/26/22   Ghimire, Werner Lean, MD  Insulin Pen Needle 32G X 4 MM MISC Use with insulin pens 05/28/22   Leroy Sea, MD  isosorbide mononitrate (IMDUR) 60 MG 24 hr tablet Take 1 tablet (60 mg total) by mouth daily. 10/26/22   Ghimire, Werner Lean, MD  melatonin 3 MG TABS tablet Take 1 tablet (3 mg total) by mouth at bedtime as needed (insomnia). 10/26/22   Ghimire, Werner Lean, MD  mupirocin ointment (BACTROBAN) 2 % Apply 1 Application topically 3 (three) times daily. 01/07/23   Mardella Layman, MD  PROTONIX 40 MG tablet Take 1 tablet (40 mg total) by mouth daily. 05/25/22   Leroy Sea, MD  simethicone (MYLICON) 80 MG chewable tablet Chew 1 tablet (80 mg total) by mouth 4 (four) times daily for 5 days. 11/22/22 12/17/22  Rodolph Bong, MD  gabapentin (NEURONTIN) 300 MG capsule Take 1 capsule (300 mg total) by mouth 3 (  three) times daily. Patient not taking: Reported on 07/27/2019 07/29/18 08/30/19  Kallie Locks, FNP  sildenafil (VIAGRA) 25 MG tablet Take 1 tablet (25 mg total) by mouth as needed for erectile dysfunction. for erectile dysfunction. Patient not taking: Reported on 08/11/2020 10/13/19 11/27/20  Kallie Locks, FNP      Allergies    Sulfa antibiotics, Other, Bactrim [sulfamethoxazole-trimethoprim], and Cephalexin    Review of Systems   Review of Systems  Gastrointestinal:  Positive for abdominal distention.  All other systems reviewed and are negative.   Physical Exam Updated Vital Signs BP (!) 158/79   Pulse 85   Temp 98.8 F (37.1 C) (Oral)   Resp 16   SpO2 93%  Physical Exam Vitals and nursing note reviewed.   Constitutional:      Appearance: He is well-developed.  HENT:     Head: Normocephalic.  Cardiovascular:     Rate and Rhythm: Normal rate.  Pulmonary:     Effort: Pulmonary effort is normal.  Abdominal:     General: There is distension.  Musculoskeletal:        General: Normal range of motion.  Skin:    General: Skin is warm.  Neurological:     General: No focal deficit present.     Mental Status: He is alert and oriented to person, place, and time.     ED Results / Procedures / Treatments   Labs (all labs ordered are listed, but only abnormal results are displayed) Labs Reviewed  COMPREHENSIVE METABOLIC PANEL - Abnormal; Notable for the following components:      Result Value   Sodium 133 (*)    Chloride 97 (*)    CO2 21 (*)    Glucose, Bld 174 (*)    BUN 87 (*)    Creatinine, Ser 10.27 (*)    Calcium 8.0 (*)    Total Protein 8.3 (*)    Albumin 3.4 (*)    Alkaline Phosphatase 200 (*)    GFR, Estimated 6 (*)    All other components within normal limits  CBC WITH DIFFERENTIAL/PLATELET - Abnormal; Notable for the following components:   Hemoglobin 11.5 (*)    MCH 24.2 (*)    MCHC 29.2 (*)    RDW 20.6 (*)    All other components within normal limits    EKG None  Radiology No results found.  Procedures Procedures    Medications Ordered in ED Medications - No data to display  ED Course/ Medical Decision Making/ A&P                                 Medical Decision Making Patient here requesting dialysis.  Patient complains of his abdomen being swollen.  He states he last had dialysis on Thursday.  Amount and/or Complexity of Data Reviewed Labs: ordered. Decision-making details documented in ED Course.    Details: Labs ordered reviewed and interpreted patient's potassium is 4.8 Radiology: ordered.    Details: Patient refused chest x-ray Discussion of management or test interpretation with external provider(s): I spoke with Dr. Juel Burrow nephrologist on-call.   He advised patient can have dialysis here tomorrow.           Final Clinical Impression(s) / ED Diagnoses Final diagnoses:  Hypovolemia  ESRD (end stage renal disease) on dialysis [facility])    Rx / DC Orders ED Discharge Orders     None  Elson Areas, Cordelia Poche 02/16/23 2301    Vanetta Mulders, MD 02/17/23 1610    Vanetta Mulders, MD 02/17/23 [phone]

## 2023-02-16 NOTE — Progress Notes (Signed)
[facility] Patient Discharge Orders- [name] CLINIC: gkc  Patient's name: [name] Admit/DC Dates: 01/30/2023 - 02/01/2023  Discharge Diagnoses: Hypervolemia / missed HD  Hypocalcemia. Chronic systolic CHF reduced EF 20 to 16% (congestive h eart failure  Uncontrolled hypertension  Homelessness Poor compliance  Aranesp: Given: 00   Date and amount of last dose: 0  Last Hgb: 9.3 PRBC's Given: 0 Date/# of units: 0 ESA dose for discharge: mircera 0 mcg IV q 2 weeks  IV Iron dose at discharge: 0  Heparin change: no  EDW Change: no New EDW:   Bath Change: no  Access intervention/Change: no Details:  Hectorol/Calcitriol change: no  Discharge Labs: Calcium7.6 Phosphorus 2.8 Albumin 3.1 K+ 4.4  IV Antibiotics: no Details:  On Coumadin?: no Last INR: Next INR: Managed By:   OTHER/APPTS/LAB ORDERS:    D/C Meds to be reconciled by nurse after every discharge.  Completed By:   Reviewed by: MD:______ RN_______

## 2023-02-16 NOTE — Progress Notes (Signed)
[facility] Patient Discharge Orders- [facility] CLINIC: gkc  Patient's name: ACEN CRAUN Admit/DC Dates: 02/04/2023 - 02/15/2023  Discharge Diagnoses: Volume overload / missed hd   Chronic systolic CHF reduced EF 20 to 40% (congestive heart failure  Noncompliance Homelessness  Aranesp: Given: no   Date and amount of last dose: 0  Last Hgb: 9.3 PRBC's Given: 0 Date/# of units: 0 ESA dose for discharge: mircera 0 mcg IV q 2 weeks  per protocol IV Iron dose at discharge: 0  Heparin change: 0  EDW Change: 0 New EDW:   Bath Change: 0  Access intervention/Change: 0 Details:  Hectorol/Calcitriol change: 0  Discharge Labs: Calcium7.5 Phosphorus 2.8 Albumin 3.1 K+ 4.4  IV Antibiotics: no Details:  On Coumadin?: no Last INR: Next INR: Managed By:   OTHER/APPTS/LAB ORDERS:    D/C Meds to be reconciled by nurse after every discharge.  Completed By:   Reviewed by: MD:______ RN_______

## 2023-02-16 NOTE — ED Notes (Signed)
Pt called multiple times for vitals and pt is not in the lobby or bathroom.

## 2023-02-16 NOTE — ED Triage Notes (Addendum)
Pt states he needs dialysis. States he has too much fluid on him. Last dialysis was Thursday. Did not have it yesterday. Pt a/o. Would not take coat off for bp. Pt irritated. States he is homeless and can't get to apt's sometimes. States he was dc from [employer] yesterday. Nad. Abd distended and firm. Pt has large bag of food and large drink. Pt advised not to eat/drink until seen by edp.  Ble swelling noted as well [place] to right lower arm and thrills felt.

## 2023-02-16 NOTE — Progress Notes (Signed)
[facility] Patient Discharge Orders- [facility] CLINIC: gkc  Patient's name: [name] Admit/DC Dates: 01/30/2023 - 02/01/2023  Discharge Diagnoses: Volume overload / missed HD   Chronic systolic CHF reduced EF 20 to 40% (congestive heart failure)  Essential hypertension  Noncompliance   Aranesp: Given: no   Date and amount of last dose: 0  Last Hgb: 9.3 PRBC's Given: 0 Date/# of units: 0 ESA dose for discharge: mircera 0 mcg IV q 2 weeks  IV Iron dose at discharge: 0  Heparin change: no  EDW Change: no New EDW:   Bath Change: no  Access intervention/Change: no Details:  Hectorol/Calcitriol change: no  Discharge Labs: Calcium7.6 Phosphorus 2.8 Albumin 3.1 K+ 4.4  IV Antibiotics: no Details:  On Coumadin?: no Last INR: Next INR: Managed By:   OTHER/APPTS/LAB ORDERS:    D/C Meds to be reconciled by nurse after every discharge.  Completed By:   Reviewed by: MD:______ RN_______

## 2023-02-16 NOTE — ED Notes (Signed)
Pt called 2x no answer 

## 2023-02-16 NOTE — ED Notes (Signed)
Pt was statting at 88% in triage, he stated he is supposed to be on oxygen. Pt was put on 2 lpm via nasal cannula and monitored, saturation increased to 98%.

## 2023-02-16 NOTE — ED Notes (Signed)
Patient disconnected himself from the monitor and walked to the bathroom, took a shower. Patient was assisted back to his room. Patient complaining of short of breathe. Patient placed on 2 liters via nasal cannula for comfort.

## 2023-02-16 NOTE — ED Notes (Signed)
X ray in room.

## 2023-02-17 DIAGNOSIS — E8779 Other fluid overload: Secondary | ICD-10-CM | POA: Diagnosis not present

## 2023-02-17 DIAGNOSIS — N250 Renal osteodystrophy: Secondary | ICD-10-CM | POA: Diagnosis not present

## 2023-02-17 DIAGNOSIS — Z992 Dependence on renal dialysis: Secondary | ICD-10-CM | POA: Diagnosis not present

## 2023-02-17 DIAGNOSIS — D631 Anemia in chronic kidney disease: Secondary | ICD-10-CM | POA: Diagnosis not present

## 2023-02-17 DIAGNOSIS — N186 End stage renal disease: Secondary | ICD-10-CM | POA: Diagnosis not present

## 2023-02-17 DIAGNOSIS — I5022 Chronic systolic (congestive) heart failure: Secondary | ICD-10-CM | POA: Diagnosis not present

## 2023-02-17 MED ORDER — HEPARIN SODIUM (PORCINE) 1000 UNIT/ML DIALYSIS: 1000.0000 [IU] | INTRAMUSCULAR | Status: DC | PRN

## 2023-02-17 MED ORDER — LIDOCAINE-PRILOCAINE 2.5-2.5 % EX CREA: 1.0000 | TOPICAL_CREAM | CUTANEOUS | Status: DC | PRN

## 2023-02-17 MED ORDER — ALTEPLASE 2 MG IJ SOLR: 2.0000 mg | Freq: Once | INTRAMUSCULAR | Status: DC | PRN

## 2023-02-17 MED ORDER — CHLORHEXIDINE GLUCONATE CLOTH 2 % EX PADS: 6.0000 | MEDICATED_PAD | Freq: Every day | CUTANEOUS | Status: DC

## 2023-02-17 MED ORDER — HEPARIN SODIUM (PORCINE) 1000 UNIT/ML DIALYSIS: 2000.0000 [IU] | Freq: Once | INTRAMUSCULAR | Status: DC

## 2023-02-17 MED ORDER — LIDOCAINE HCL (PF) 1 % IJ SOLN: 5.0000 mL | INTRAMUSCULAR | Status: DC | PRN

## 2023-02-17 MED ORDER — PENTAFLUOROPROP-TETRAFLUOROETH EX AERO: 1.0000 | INHALATION_SPRAY | CUTANEOUS | Status: DC | PRN

## 2023-02-17 MED ORDER — ANTICOAGULANT SODIUM CITRATE 4% (200MG/5ML) IV SOLN: 5.0000 mL | Status: DC | PRN

## 2023-02-17 NOTE — Progress Notes (Signed)
Late Note Entry- February 17, 2023  Advised by hospital staff that pt was d/c on Saturday. Noted pt currently at [facility]. Update provided to pt's out-pt clinic [facility]) this morning regarding this info.   Olivia Canter Renal Navigator [phone]

## 2023-02-17 NOTE — Progress Notes (Signed)
Subjective:   Patient just discharged from [facility] 9/28 for noncompliance with dialysis and hypervolemia with chronic volume overload He presented to the ED the next day and was admitted here at [facility] He is seen in the room, he does not provide any history, he voices no complaints K4.8, BUN 87 Blood pressures are stable, especially for him, he is on room air He has 2+ edema in the lower extremities, AVG with bruit and thrill  Objective Vital signs in last 24 hours: Vitals:   02/16/23 1830 02/16/23 2203 02/17/23 0100 02/17/23 0457  BP: (!) 151/95 (!) 158/79 (!) 160/92 (!) 154/85  Pulse: 83 85 80 75  Resp: 19 16 16 18   Temp:    98.5 F (36.9 C)  TempSrc:    Oral  SpO2: 95% 93% 94% 96%   Weight change:    Physical Exam: General: Alert adult male NAD, not engaging Heart: RRR no MRG Lungs: CTA bilaterally nonlabored breathing Abdomen: NABS, slightly distended abdominal wall fluid ,nontender Extremities: 2+ bilateral pedal edema  Dialysis Access: RUE AV AVG positive bruit   OP HD: TTS [facility]  4h   400/800   61.5kg  2/2.5 bath  RFA AVG   Heparin 2000 - hep B labs done 9/13 - last OP HD 8/31, post wt 75kg - rocaltrol 0.25 mcg po three times per week - no esa, last Hb 12 on 8/31    CXR 9/17 - mild vasc congestion, no edema.      Assessment/ Plan: Chronic volume overload - due to chronic nonadherence with dialysis.  Dialysis as an inpatient/ED border today then that he needs to return to his outpatient unit for ongoing treatment.Marland Kitchen  ESRD - on HD TTS. Last OP HD was 8/31.  He fails to provide a reason why he cannot go to outpatient dialysis HTN -volume responsive.   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  Homelessness  Arita Miss, MD  [facility] 02/17/2023,9:57 AM  LOS: 0 days   Labs: Basic Metabolic Panel: Recent Labs  Lab 02/12/23 0709 02/13/23 0940 02/16/23 1716  NA 136 134* 133*  K 4.1 4.4 4.8   CL 94* 98 97*  CO2 22 24 21*  GLUCOSE 87 149* 174*  BUN 52* 69* 87*  CREATININE 8.79* 10.77* 10.27*  CALCIUM 8.2* 7.6* 8.0*  PHOS 2.8  --   --    Liver Function Tests: Recent Labs  Lab 02/12/23 0709 02/16/23 1716  AST  --  28  ALT  --  23  ALKPHOS  --  200*  BILITOT  --  0.4  PROT  --  8.3*  ALBUMIN 3.1* 3.4*   No results for input(s): "LIPASE", "AMYLASE" in the last 168 hours. No results for input(s): "AMMONIA" in the last 168 hours. CBC: Recent Labs  Lab 02/13/23 0940 02/16/23 1716  WBC 7.4 6.5  NEUTROABS  --  4.7  HGB 9.3* 11.5*  HCT 30.9* 39.4  MCV 83.3 82.9  PLT 266 299   Cardiac Enzymes: No results for input(s): "CKTOTAL", "CKMB", "CKMBINDEX", "TROPONINI" in the last 168 hours. CBG: Recent Labs  Lab 02/12/23 1636 02/12/23 2119 02/13/23 2003 02/14/23 0743 02/14/23 1139  GLUCAP 115* 192* 190* 88 178*    Studies/Results: No results found. Medications:  anticoagulant sodium citrate      Chlorhexidine Gluconate Cloth  6 each Topical Q0600   heparin  2,000 Units Dialysis Once in dialysis

## 2023-02-17 NOTE — ED Notes (Signed)
Pt to go to dialysis around 5:30

## 2023-02-17 NOTE — Discharge Instructions (Signed)
Follow-up for dialysis as per [facility].

## 2023-02-17 NOTE — ED Notes (Signed)
Lab came to draw blood work and patient refused. Patient refused to answer any questions stating that "he is still sleeping".

## 2023-02-17 NOTE — Progress Notes (Signed)
   HEMODIALYSIS TREATMENT NOTE:  HD was delayed due pt insisting to shower prior to treatment.  3 hour heparin-free treatment completed using right forearm loop AVG.  All blood was returned. Goal met: 2.7 liters removed without interruption in UF.  Pt elected to end treatment 1 hour early / AMA.  Post-HD:  02/17/23 2230  Vitals  Temp 98 F (36.7 C)  Temp Source Oral  BP (!) 155/92  MAP (mmHg) 89  BP Location Left Arm  BP Method Automatic  Patient Position (if appropriate) Sitting  Pulse Rate 77  Pulse Rate Source Monitor  Resp 18  Oxygen Therapy  SpO2 100 %  O2 Device Room Air  Post Treatment  Dialyzer Clearance Heavily streaked  Hemodialysis Intake (mL) 0 mL  Liters Processed 56.6  Fluid Removed (mL) 2700 mL  Tolerated HD Treatment No (Comment)  Post-Hemodialysis Comments Signed off early/AMA with 1 hour remaining  AVG/AVF Arterial Site Held (minutes) 10 minutes  AVG/AVF Venous Site Held (minutes) 10 minutes  Fistula / Graft Right Forearm  No placement date or time found.   Placed prior to admission: Yes  Orientation: Right  Access Location: Forearm  Fistula / Graft Assessment Thrill;Bruit  Status Patent    Pt was transported by stretcher back to ED hallway 6 to await disposition.  Report given to charge nurse Ginger.   Arman Filter, RN AP [facility]

## 2023-02-18 LAB — HEPATITIS B SURFACE ANTIGEN: Hepatitis B Surface Ag: NONREACTIVE

## 2023-02-19 LAB — HEPATITIS B SURFACE ANTIBODY, QUANTITATIVE: Hep B S AB Quant (Post): <3.5 m[IU]/mL — ABNORMAL LOW

## 2023-02-20 ENCOUNTER — Ambulatory Visit (HOSPITAL_COMMUNITY)
Admission: EM | Admit: 2023-02-20 | Discharge: 2023-02-20 | Disposition: A | Discharge status: Home / Self Care | Payer: 59 | Attending: Internal Medicine | Admitting: Internal Medicine

## 2023-02-20 ENCOUNTER — Encounter (HOSPITAL_COMMUNITY): Payer: Self-pay | Admitting: Emergency Medicine

## 2023-02-20 DIAGNOSIS — L739 Follicular disorder, unspecified: Secondary | ICD-10-CM

## 2023-02-20 DIAGNOSIS — L989 Disorder of the skin and subcutaneous tissue, unspecified: Secondary | ICD-10-CM | POA: Diagnosis not present

## 2023-02-20 MED ORDER — DOXYCYCLINE HYCLATE 100 MG PO CAPS: 100.0000 mg | ORAL_CAPSULE | Freq: Two times a day (BID) | ORAL | 0 refills | Status: AC

## 2023-02-20 NOTE — ED Triage Notes (Signed)
Pt c/o painful bumps on both knees, head, and nose. That he first notice a week ago.

## 2023-02-20 NOTE — Discharge Instructions (Addendum)
Please take medications as prescribed Please apply skin moisturizer to help with dry skin Continue hemodialysis per your Tuesday Thursday Saturday schedule Please feel free to return to urgent care if you have any other concerns.

## 2023-02-23 ENCOUNTER — Emergency Department (HOSPITAL_COMMUNITY): Payer: 59

## 2023-02-23 ENCOUNTER — Other Ambulatory Visit: Payer: Self-pay

## 2023-02-23 ENCOUNTER — Emergency Department (HOSPITAL_COMMUNITY)
Admission: EM | Admit: 2023-02-23 | Discharge: 2023-02-24 | Disposition: A | Discharge status: Home / Self Care | Payer: 59 | Attending: Emergency Medicine | Admitting: Emergency Medicine

## 2023-02-23 DIAGNOSIS — Z7982 Long term (current) use of aspirin: Secondary | ICD-10-CM | POA: Insufficient documentation

## 2023-02-23 DIAGNOSIS — E8770 Fluid overload, unspecified: Secondary | ICD-10-CM | POA: Diagnosis not present

## 2023-02-23 DIAGNOSIS — N186 End stage renal disease: Secondary | ICD-10-CM | POA: Diagnosis not present

## 2023-02-23 DIAGNOSIS — Z992 Dependence on renal dialysis: Secondary | ICD-10-CM | POA: Insufficient documentation

## 2023-02-23 DIAGNOSIS — R0602 Shortness of breath: Secondary | ICD-10-CM | POA: Diagnosis not present

## 2023-02-23 DIAGNOSIS — R0989 Other specified symptoms and signs involving the circulatory and respiratory systems: Secondary | ICD-10-CM | POA: Diagnosis not present

## 2023-02-23 DIAGNOSIS — I120 Hypertensive chronic kidney disease with stage 5 chronic kidney disease or end stage renal disease: Secondary | ICD-10-CM | POA: Diagnosis not present

## 2023-02-23 DIAGNOSIS — Z794 Long term (current) use of insulin: Secondary | ICD-10-CM | POA: Diagnosis not present

## 2023-02-23 LAB — CBC WITH DIFFERENTIAL/PLATELET
Abs Immature Granulocytes: 0.02 10*3/uL (ref 0.00–0.07)
Basophils Absolute: 0.1 10*3/uL (ref 0.0–0.1)
Basophils Relative: 1 %
Eosinophils Absolute: 0.3 10*3/uL (ref 0.0–0.5)
Eosinophils Relative: 5 %
HCT: 39.1 % (ref 39.0–52.0)
Hemoglobin: 11.7 g/dL — ABNORMAL LOW (ref 13.0–17.0)
Immature Granulocytes: 0 %
Lymphocytes Relative: 14 %
Lymphs Abs: 0.8 10*3/uL (ref 0.7–4.0)
MCH: 24 pg — ABNORMAL LOW (ref 26.0–34.0)
MCHC: 29.9 g/dL — ABNORMAL LOW (ref 30.0–36.0)
MCV: 80.3 fL (ref 80.0–100.0)
Monocytes Absolute: 0.5 10*3/uL (ref 0.1–1.0)
Monocytes Relative: 8 %
Neutro Abs: 4.1 10*3/uL (ref 1.7–7.7)
Neutrophils Relative %: 72 %
Platelets: 380 10*3/uL (ref 150–400)
RBC: 4.87 MIL/uL (ref 4.22–5.81)
RDW: 20.7 % — ABNORMAL HIGH (ref 11.5–15.5)
WBC: 5.8 10*3/uL (ref 4.0–10.5)
nRBC: 0 % (ref 0.0–0.2)

## 2023-02-23 LAB — BASIC METABOLIC PANEL
Anion gap: 16 — ABNORMAL HIGH (ref 5–15)
BUN: 103 mg/dL — ABNORMAL HIGH (ref 6–20)
CO2: 21 mmol/L — ABNORMAL LOW (ref 22–32)
Calcium: 7.4 mg/dL — ABNORMAL LOW (ref 8.9–10.3)
Chloride: 98 mmol/L (ref 98–111)
Creatinine, Ser: 13.3 mg/dL — ABNORMAL HIGH (ref 0.61–1.24)
GFR, Estimated: 4 mL/min — ABNORMAL LOW (ref >60)
Glucose, Bld: 121 mg/dL — ABNORMAL HIGH (ref 70–99)
Potassium: 5 mmol/L (ref 3.5–5.1)
Sodium: 135 mmol/L (ref 135–145)

## 2023-02-23 MED ORDER — ONDANSETRON HCL 4 MG/2ML IJ SOLN
4.0000 mg | Freq: Once | INTRAMUSCULAR | Status: AC
  Administered 2023-02-23: 4 mg via INTRAVENOUS
  Filled 2023-02-23: qty 2

## 2023-02-23 MED ORDER — ANTICOAGULANT SODIUM CITRATE 4% (200MG/5ML) IV SOLN: 5.0000 mL | Status: DC | PRN

## 2023-02-23 MED ORDER — ALTEPLASE 2 MG IJ SOLR: 2.0000 mg | Freq: Once | INTRAMUSCULAR | Status: DC | PRN

## 2023-02-23 MED ORDER — LIDOCAINE-PRILOCAINE 2.5-2.5 % EX CREA: 1.0000 | TOPICAL_CREAM | CUTANEOUS | Status: DC | PRN

## 2023-02-23 MED ORDER — CHLORHEXIDINE GLUCONATE CLOTH 2 % EX PADS: 6.0000 | MEDICATED_PAD | Freq: Every day | CUTANEOUS | Status: DC

## 2023-02-23 MED ORDER — HEPARIN SODIUM (PORCINE) 1000 UNIT/ML DIALYSIS: 2000.0000 [IU] | INTRAMUSCULAR | Status: DC | PRN

## 2023-02-23 MED ORDER — HEPARIN SODIUM (PORCINE) 1000 UNIT/ML DIALYSIS
2000.0000 [IU] | Freq: Once | INTRAMUSCULAR | Status: DC
  Filled 2023-02-23: qty 2

## 2023-02-23 MED ORDER — PENTAFLUOROPROP-TETRAFLUOROETH EX AERO: 1.0000 | INHALATION_SPRAY | CUTANEOUS | Status: DC | PRN

## 2023-02-23 MED ORDER — HEPARIN SODIUM (PORCINE) 1000 UNIT/ML DIALYSIS: 1000.0000 [IU] | INTRAMUSCULAR | Status: DC | PRN

## 2023-02-23 MED ORDER — LIDOCAINE HCL (PF) 1 % IJ SOLN: 5.0000 mL | INTRAMUSCULAR | Status: DC | PRN

## 2023-02-23 MED ORDER — NEPRO/CARBSTEADY PO LIQD: 237.0000 mL | ORAL | Status: DC | PRN

## 2023-02-23 NOTE — Progress Notes (Signed)
We are asked to see for ED HD. The plan will be for ED hemodialysis. Patient is not being admitted. Pt will go to the dialysis unit upstairs when they are ready for the pt. Then the pt will get dialysis. When dialysis is completed pt will be sent back to ED for reassessment.    Pt has not been to his outpatient center since last week of August. He has been here for intermittently for dialysis. Last admit was 9/24- 9/30 during which he had HD x 3.    His is a vague historian but I am told he might be homeless.   Labs today - K 5.0, BUN 103, creat 13, Hb 11.7, WBC 5K.   Brief exam - lungs mostly clear, 2-3+ bilat LE pitting edema, on RA, alert and responsive.   OP HD: TTS [facility] 4h  400/800   61.5kg  2/2.5 bath   AVG   Heparin 2000 - last OP HD 8/31 - last mircera was 100 mcg last week of August - rocaltrol 0.25 mcg po three times per week   Vinson Moselle  MD  [facility] 02/23/2023, 5:16 PM  Recent Labs  Lab 02/23/23 1038  HGB 11.7*  CALCIUM 7.4*  CREATININE 13.30*  K 5.0    Inpatient medications:

## 2023-02-23 NOTE — ED Triage Notes (Signed)
Pt.stated, Ive not had dialysis in a week , Im feeling way over loaded, tired, SOB , I really need 2 treatments in one.

## 2023-02-23 NOTE — ED Notes (Signed)
Lunch tray ordered 

## 2023-02-23 NOTE — ED Provider Notes (Signed)
[place] EMERGENCY DEPARTMENT AT [facility] Provider Note   CSN: [id number] Arrival date & time: 02/23/23  1027     History  Chief Complaint  Patient presents with   Vascular Access Problem   Shortness of Breath   Leg Pain   Abdominal Pain    [name] is a 52 y.o. male, history of ESRD, on dialysis, noncompliant, who presents to the ED secondary to feeling short of breath, having swollen legs, and swelling of his abdomen, this been going for the last week.  He states he has missed 3 of his dialysis sessions, and wants another dialysis session.  He denies any chest pain, nausea, vomiting.  Patient states " need to be dialyzed bad."    Home Medications Prior to Admission medications   Medication Sig Start Date End Date Taking? Authorizing Provider  acetaminophen (TYLENOL) 500 MG tablet Take 1 tablet (500 mg total) by mouth every 8 (eight) hours as needed for mild pain. 05/28/22   Leroy Sea, MD  ASPIRIN LOW DOSE 81 MG tablet Take 1 tablet (81 mg total) by mouth daily. 05/28/22   Leroy Sea, MD  calcitRIOL (ROCALTROL) 0.25 MCG capsule Take 1 capsule (0.25 mcg total) by mouth every Tuesday, Thursday, and Saturday at 6 PM. 10/26/22   Ghimire, Werner Lean, MD  calcium acetate (PHOSLO) 667 MG tablet Take 3 tablets (2,001 mg total) by mouth 3 (three) times daily. 12/12/22     carvedilol (COREG) 12.5 MG tablet Take 1 tablet (12.5 mg total) by mouth 2 (two) times daily with a meal. 02/15/23   Zannie Cove, MD  doxycycline (VIBRAMYCIN) 100 MG capsule Take 1 capsule (100 mg total) by mouth 2 (two) times daily for 7 days. 02/20/23 02/27/23  Merrilee Jansky, MD  glucose blood test strip Use to check fasting blood sugar once daily. 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 10/26/22   [provider]  hydrALAZINE (APRESOLINE) 25 MG tablet Take 1 tablet (25 mg total) by mouth 3  (three) times daily. 02/15/23   Zannie Cove, MD  insulin aspart (NOVOLOG FLEXPEN) 100 UNIT/ML FlexPen 0-9 Units, Subcutaneous, 3 times daily with meals CBG < 70: Implement Hypoglycemia measures CBG 70 - 120: 0 units CBG 121 - 150: 1 unit CBG 151 - 200: 2 units CBG 201 - 250: 3 units CBG 251 - 300: 5 units CBG 301 - 350: 7 units CBG 351 - 400: 9 units CBG > 400: call MD 10/26/22   Maretta Bees, MD  insulin glargine-yfgn (SEMGLEE) 100 UNIT/ML injection Inject 0.07 mLs (7 Units total) into the skin at bedtime. 10/26/22   Ghimire, Werner Lean, MD  Insulin Pen Needle 32G X 4 MM MISC Use with insulin pens 05/28/22   Leroy Sea, MD  isosorbide mononitrate (IMDUR) 60 MG 24 hr tablet Take 1 tablet (60 mg total) by mouth daily. 10/26/22   Ghimire, Werner Lean, MD  melatonin 3 MG TABS tablet Take 1 tablet (3 mg total) by mouth at bedtime as needed (insomnia). 10/26/22   Ghimire, Werner Lean, MD  mupirocin ointment (BACTROBAN) 2 % Apply 1 Application topically 3 (three) times daily. 01/07/23   Mardella Layman, MD  PROTONIX 40 MG tablet Take 1 tablet (40 mg total) by mouth daily. 05/25/22   Leroy Sea, MD  simethicone (MYLICON) 80 MG chewable tablet Chew 1 tablet (80 mg total) by  mouth 4 (four) times daily for 5 days. 11/22/22 12/17/22  Rodolph Bong, MD  gabapentin (NEURONTIN) 300 MG capsule Take 1 capsule (300 mg total) by mouth 3 (three) times daily. Patient not taking: Reported on 07/27/2019 07/29/18 08/30/19  Kallie Locks, FNP  sildenafil (VIAGRA) 25 MG tablet Take 1 tablet (25 mg total) by mouth as needed for erectile dysfunction. for erectile dysfunction. Patient not taking: Reported on 08/11/2020 10/13/19 11/27/20  Kallie Locks, FNP      Allergies    Sulfa antibiotics, Other, Bactrim [sulfamethoxazole-trimethoprim], and Cephalexin    Review of Systems   Review of Systems  Respiratory:  Positive for shortness of breath.   Cardiovascular:  Negative for chest pain.  Gastrointestinal:  Positive  for abdominal pain.    Physical Exam Updated Vital Signs BP (!) 173/88   Pulse 80   Temp 97.8 F (36.6 C)   Resp 17   SpO2 95%  Physical Exam Vitals and nursing note reviewed.  Constitutional:      General: He is not in acute distress.    Appearance: He is well-developed.  HENT:     Head: Normocephalic and atraumatic.  Eyes:     Conjunctiva/sclera: Conjunctivae normal.  Cardiovascular:     Rate and Rhythm: Normal rate and regular rhythm.     Heart sounds: No murmur heard. Pulmonary:     Effort: Pulmonary effort is normal. No respiratory distress.     Breath sounds: Normal breath sounds.  Abdominal:     Palpations: Abdomen is soft.     Tenderness: There is no abdominal tenderness.     Comments: +ascites  Musculoskeletal:        General: No swelling.     Cervical back: Neck supple.     Right lower leg: 2+ Pitting Edema present.     Left lower leg: 2+ Pitting Edema present.  Skin:    General: Skin is warm and dry.     Capillary Refill: Capillary refill takes less than 2 seconds.  Neurological:     Mental Status: He is alert.  Psychiatric:        Mood and Affect: Mood normal.     ED Results / Procedures / Treatments   Labs (all labs ordered are listed, but only abnormal results are displayed) Labs Reviewed  CBC WITH DIFFERENTIAL/PLATELET - Abnormal; Notable for the following components:      Result Value   Hemoglobin 11.7 (*)    MCH 24.0 (*)    MCHC 29.9 (*)    RDW 20.7 (*)    All other components within normal limits  BASIC METABOLIC PANEL - Abnormal; Notable for the following components:   CO2 21 (*)    Glucose, Bld 121 (*)    BUN 103 (*)    Creatinine, Ser 13.30 (*)    Calcium 7.4 (*)    GFR, Estimated 4 (*)    Anion gap 16 (*)    All other components within normal limits    EKG None  Radiology DG Chest 1 View  Result Date: 02/23/2023 CLINICAL DATA:  Shortness of breath EXAM: CHEST  1 VIEW COMPARISON:  02/04/2023 and older FINDINGS: Enlarged  cardiopericardial silhouette with some prominence of the central vasculature. No consolidation, pneumothorax or effusion. No edema. IMPRESSION: Enlarged cardiac silhouette with central vascular congestion. Similar to previous Electronically Signed   By: Karen Kays M.D.   On: 02/23/2023 11:12    Procedures Procedures    Medications Ordered in ED  Medications - No data to display  ED Course/ Medical Decision Making/ A&P                                 Medical Decision Making Patient is a 52 year old male, history of ESRD, on dialysis, here for shortness of breath swelling in the legs, after missing 3 dialysis sessions this past week.  Last week he got dialyzed, but this week he has not gone.  He is overall well.  Denies ascites, 2+ pitting edema bilateral lower extremities, and some shortness of breath however.  He is not tachypneic, no labored respirations, overall comfortable appearing.  We will obtain a chest x-ray, blood work, for further evaluations and discussed with nephrology for need for dialysis  Amount and/or Complexity of Data Reviewed Labs: ordered.    Details: Creatinine at 13.3 Radiology: ordered.    Details: Chest x-ray shows enlarged heart, with central vascular congestion similar to previous Discussion of management or test interpretation with external provider(s): Dr. Revonda Humphrey discussed with nephrology on-call Dr. Arlean Hopping, they state that unavailable to do dialysis, due to nursing restraints, until 6 PM tonight.  Patient well-appearing, not requiring oxygen, I discussed this with the patient, he is willing to wait until 6 PM tonight, for his dialysis.  Signed out pending dialysis    Final Clinical Impression(s) / ED Diagnoses Final diagnoses:  ESRD on dialysis [facility])  Shortness of breath    Rx / DC Orders ED Discharge Orders     None         Evamaria Detore, Harley Alto, PA 02/23/23 1341    Gwyneth Sprout, MD 02/27/23 [phone]

## 2023-02-23 NOTE — ED Notes (Signed)
Transported to hemodialysis unit.

## 2023-02-23 NOTE — ED Notes (Signed)
Pt. Refused to get upon his stretcher , wants to sit on the side.

## 2023-02-24 NOTE — ED Provider Notes (Signed)
Patient returned from dialysis, vital signs stable.  Requesting a sandwich.  No acute complaints.  Plan to d/c with outpatient follow up.     Tilden Fossa, MD 02/24/23 [phone]

## 2023-02-24 NOTE — ED Provider Notes (Signed)
[facility]    CSN: [id number] Arrival date & time: 02/20/23  1815      History   Chief Complaint No chief complaint on file.   HPI [name] is a 52 y.o. male with a history of chronic kidney disease stage V on Tuesday Thursday Saturday hemodialysis schedule comes to urgent care with painful lesions on the left knee, upper back, nose and scalp region.  Patient's symptoms started a week ago and has been persistent.  No fever or chills.  No discharge.  Patient has lower extremity swelling which is unchanged from previously. HPI  Past Medical History:  Diagnosis Date   Anxiety 02/2019   Anxiety 10/2019   Diabetes mellitus type II, uncontrolled 07/17/2006   Elevated alkaline phosphatase level 01/2019   ESRD on hemodialysis (HCC)    tts HD   ESSENTIAL HYPERTENSION 07/17/2006   Homelessness 10/13/2021   HYPERLIPIDEMIA 11/24/2008   Insomnia 02/2019   Left bundle branch block 02/22/2019   Lesion of lip 10/2019   Pneumonia 10/22/2011   Suicidal ideations 02/2019   Vitamin D deficiency 01/2019    Patient Active Problem List   Diagnosis Date Noted   Elevated troponin 02/05/2023   Anemia of chronic disease 01/31/2023   Acute CHF (congestive heart failure) (HCC) 12/05/2022   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   MRSA bacteremia 10/20/2022   Metabolic acidosis, increased anion gap 10/20/2022   Fall 10/20/2022   GERD (gastroesophageal reflux disease) 10/20/2022   Hemodialysis AV fistula thrombosis, initial encounter (HCC) 09/11/2022   AV fistula occlusion (HCC) 09/11/2022   CAP (community acquired pneumonia) 05/20/2022   Chronic systolic CHF reduced EF 20 to 16% (congestive heart failure) (HCC) 05/20/2022   GAD (generalized anxiety disorder) 03/26/2022   Hypocalcemia 03/25/2022   Microcytic anemia 03/20/2022   CHF (congestive heart failure) (HCC) 11/15/2021   Nausea vomiting and diarrhea 11/07/2021   Homelessness 10/13/2021   Acute hypoxic  respiratory failure (HCC) 10/13/2021   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) 09/28/2021   Aspiration pneumonia (HCC) 09/28/2021   ESRD on dialysis (HCC) 04/12/2021   Mild protein-calorie malnutrition (HCC) 03/06/2021   Noncompliance with renal dialysis (HCC) 02/27/2021   Allergy, unspecified, initial encounter 02/27/2021   History of anemia due to chronic kidney disease 02/27/2021   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   Secondary hyperparathyroidism of renal origin (HCC) 02/27/2021   Shortness of breath 02/27/2021   Type 2 diabetes mellitus with other diabetic kidney complication (HCC) 02/27/2021   Hypertensive urgency 02/18/2021   Type 2 diabetes mellitus (HCC) 02/17/2021   Acute on chronic combined systolic and diastolic CHF (congestive heart failure) (HCC) 11/29/2020   Hypertensive crisis 11/27/2020   Stage 4 chronic kidney disease (HCC) 02/22/2020   Elevated brain natriuretic peptide (BNP) level 10/07/2019   Left ventricular dysfunction 04/27/2019   Hemoglobin A1C greater than 9%, indicating poor diabetic control 07/29/2018   LBBB (left bundle branch block)    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   Hyperlipidemia LDL goal <70 11/24/2008   DM2 (diabetes mellitus, type 2) (HCC) 07/17/2006   Diabetes mellitus type 2, insulin dependent (HCC) 07/17/2006   ESRD (end stage renal disease) (HCC) 07/17/2006   Essential hypertension 07/17/2006    Past Surgical History:  Procedure Laterality Date   ARM SURGERY     AV FISTULA PLACEMENT Right 02/20/2021   Procedure: RIGHT ARTERIOVENOUS GRAFT CREATION;  Surgeon: Chuck Hint, MD;  Location: [facility] OR;  Service: Vascular;  Laterality: Right;   INSERTION OF DIALYSIS CATHETER Left 02/20/2021   Procedure: INSERTION OF DIALYSIS CATHETER USING PALINDROME CATHETER;  Surgeon: Chuck Hint, MD;  Location: [facility] OR;  Service: Vascular;  Laterality: Left;   IR AV DIALY SHUNT INTRO  NEEDLE/INTRACATH INITIAL W/PTA/IMG RIGHT Right 11/06/2021   IR FLUORO GUIDE CV LINE RIGHT  09/12/2022   IR REMOVAL TUN CV CATH W/O FL  07/27/2021   IR THROMBECTOMY AV FISTULA W/THROMBOLYSIS INC/SHUNT/IMG RIGHT  09/11/2022   IR US GUIDE VASC ACCESS RIGHT  11/06/2021   IR US GUIDE VASC ACCESS RIGHT  09/11/2022   IR US GUIDE VASC ACCESS RIGHT  09/12/2022   TEE WITHOUT CARDIOVERSION N/A 10/25/2022   Procedure: TRANSESOPHAGEAL ECHOCARDIOGRAM;  Surgeon: Chilton Si, MD;  Location: [facility] INVASIVE CV LAB;  Service: Cardiovascular;  Laterality: N/A;   ULTRASOUND GUIDANCE FOR VASCULAR ACCESS  02/20/2021   Procedure: ULTRASOUND GUIDANCE FOR VASCULAR ACCESS;  Surgeon: Chuck Hint, MD;  Location[facility] OR;  Service: Vascular;;       Home Medications    Prior to Admission medications   Medication Sig Start Date End Date Taking? Authorizing Provider  doxycycline (VIBRAMYCIN) 100 MG capsule Take 1 capsule (100 mg total) by mouth 2 (two) times daily for 7 days. 02/20/23 02/27/23 Yes Channon Brougher, Britta Mccreedy, MD  acetaminophen (TYLENOL) 500 MG tablet Take 1 tablet (500 mg total) by mouth every 8 (eight) hours as needed for mild pain. 05/28/22   Leroy Sea, MD  ASPIRIN LOW DOSE 81 MG tablet Take 1 tablet (81 mg total) by mouth daily. 05/28/22   Leroy Sea, MD  calcitRIOL (ROCALTROL) 0.25 MCG capsule Take 1 capsule (0.25 mcg total) by mouth every Tuesday, Thursday, and Saturday at 6 PM. 10/26/22   Ghimire, Werner Lean, MD  calcium acetate (PHOSLO) 667 MG tablet Take 3 tablets (2,001 mg total) by mouth 3 (three) times daily. 12/12/22     carvedilol (COREG) 12.5 MG tablet Take 1 tablet (12.5 mg total) by mouth 2 (two) times daily with a meal. 02/15/23   Zannie Cove, MD  glucose blood test strip Use to check fasting blood sugar once daily. 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 10/26/22   [provider]   hydrALAZINE (APRESOLINE) 25 MG tablet Take 1 tablet (25 mg total) by mouth 3 (three) times daily. 02/15/23   Zannie Cove, MD  insulin aspart (NOVOLOG FLEXPEN) 100 UNIT/ML FlexPen 0-9 Units, Subcutaneous, 3 times daily with meals CBG < 70: Implement Hypoglycemia measures CBG 70 - 120: 0 units CBG 121 - 150: 1 unit CBG 151 - 200: 2 units CBG 201 - 250: 3 units CBG 251 - 300: 5 units CBG 301 - 350: 7 units CBG 351 - 400: 9 units CBG > 400: call MD 10/26/22   Maretta Bees, MD  insulin glargine-yfgn (SEMGLEE) 100 UNIT/ML injection Inject 0.07 mLs (7 Units total) into the skin at bedtime. 10/26/22   Ghimire, Werner Lean, MD  Insulin Pen Needle 32G X 4 MM MISC Use with insulin pens 05/28/22   Leroy Sea, MD  isosorbide mononitrate (IMDUR) 60 MG 24 hr tablet Take 1 tablet (60 mg total) by mouth daily. 10/26/22   Ghimire, Werner Lean, MD  melatonin 3 MG TABS tablet Take 1 tablet (3 mg total) by mouth at bedtime as needed (insomnia). 10/26/22   Ghimire, Werner Lean, MD  mupirocin  ointment (BACTROBAN) 2 % Apply 1 Application topically 3 (three) times daily. 01/07/23   Mardella Layman, MD  PROTONIX 40 MG tablet Take 1 tablet (40 mg total) by mouth daily. 05/25/22   Leroy Sea, MD  simethicone (MYLICON) 80 MG chewable tablet Chew 1 tablet (80 mg total) by mouth 4 (four) times daily for 5 days. 11/22/22 12/17/22  Rodolph Bong, MD  gabapentin (NEURONTIN) 300 MG capsule Take 1 capsule (300 mg total) by mouth 3 (three) times daily. Patient not taking: Reported on 07/27/2019 07/29/18 08/30/19  Kallie Locks, FNP  sildenafil (VIAGRA) 25 MG tablet Take 1 tablet (25 mg total) by mouth as needed for erectile dysfunction. for erectile dysfunction. Patient not taking: Reported on 08/11/2020 10/13/19 11/27/20  Kallie Locks, FNP    Family History Family History  Problem Relation Age of Onset   Hypertension Mother 53   Cancer Mother        bladder cancer   Alcohol abuse Father    Hypertension Brother      Social History Social History   Tobacco Use   Smoking status: Never   Smokeless tobacco: Never  Vaping Use   Vaping status: Never Used  Substance Use Topics   Alcohol use: No    Alcohol/week: 0.0 standard drinks of alcohol   Drug use: No     Allergies   Sulfa antibiotics, Other, Bactrim [sulfamethoxazole-trimethoprim], and Cephalexin   Review of Systems Review of Systems As per HPI  Physical Exam Triage Vital Signs ED Triage Vitals  Encounter Vitals Group     BP 02/20/23 1903 (!) 176/92     Systolic BP Percentile --      Diastolic BP Percentile --      Pulse Rate 02/20/23 1903 85     Resp 02/20/23 1903 16     Temp 02/20/23 1903 98.4 F (36.9 C)     Temp Source 02/20/23 1903 Oral     SpO2 02/20/23 1903 92 %     Weight --      Height --      Head Circumference --      Peak Flow --      Pain Score 02/20/23 1901 10     Pain Loc --      Pain Education --      Exclude from Growth Chart --    No data found.  Updated Vital Signs BP (!) 176/92 (BP Location: Left Arm)   Pulse 85   Temp 98.4 F (36.9 C) (Oral)   Resp 16   SpO2 92%   Visual Acuity Right Eye Distance:   Left Eye Distance:   Bilateral Distance:    Right Eye Near:   Left Eye Near:    Bilateral Near:     Physical Exam Constitutional:      Appearance: He is ill-appearing. He is not toxic-appearing or diaphoretic.  Cardiovascular:     Rate and Rhythm: Normal rate and regular rhythm.     Pulses: Normal pulses.     Heart sounds: Normal heart sounds.  Skin:    Comments: Multiple tender lesions over the left knee, nose, occipital region of the neck as well as upper back.  No surrounding erythema.  Lesions are tender to touch.  No fluctuance noted.  Neurological:     Mental Status: He is alert.      UC Treatments / Results  Labs (all labs ordered are listed, but only abnormal results are displayed) Labs Reviewed -  No data to display  EKG   Radiology DG Chest 1 View  Result  Date: 02/23/2023 CLINICAL DATA:  Shortness of breath EXAM: CHEST  1 VIEW COMPARISON:  02/04/2023 and older FINDINGS: Enlarged cardiopericardial silhouette with some prominence of the central vasculature. No consolidation, pneumothorax or effusion. No edema. IMPRESSION: Enlarged cardiac silhouette with central vascular congestion. Similar to previous Electronically Signed   By: Karen Kays M.D.   On: 02/23/2023 11:12    Procedures Procedures (including critical care time)  Medications Ordered in UC Medications - No data to display  Initial Impression / Assessment and Plan / UC Course  I have reviewed the triage vital signs and the nursing notes.  Pertinent labs & imaging results that were available during my care of the patient were reviewed by me and considered in my medical decision making (see chart for details).     1.  Skin lesions, concern for skin infection versus calcified skin lesions: Patient to be prescribed doxycycline Tylenol as needed for pain Patient is advised to follow-up with the neurologist Patient is encouraged to keep the hemodialysis schedule Return precautions given. Final Clinical Impressions(s) / UC Diagnoses   Final diagnoses:  Skin lesions  Folliculitis     Discharge Instructions      Please take medications as prescribed Please apply skin moisturizer to help with dry skin Continue hemodialysis per your Tuesday Thursday Saturday schedule Please feel free to return to urgent care if you have any other concerns.   ED Prescriptions     Medication Sig Dispense Auth. Provider   doxycycline (VIBRAMYCIN) 100 MG capsule Take 1 capsule (100 mg total) by mouth 2 (two) times daily for 7 days. 14 capsule Danney Bungert, Britta Mccreedy, MD      PDMP not reviewed this encounter.   Merrilee Jansky, MD 02/24/23 [phone]

## 2023-03-01 ENCOUNTER — Other Ambulatory Visit: Payer: Self-pay

## 2023-03-01 ENCOUNTER — Emergency Department (HOSPITAL_COMMUNITY)
Admission: EM | Admit: 2023-03-01 | Discharge: 2023-03-02 | Disposition: A | Discharge status: Home / Self Care | Payer: 59 | Attending: Emergency Medicine | Admitting: Emergency Medicine

## 2023-03-01 DIAGNOSIS — R1084 Generalized abdominal pain: Secondary | ICD-10-CM | POA: Insufficient documentation

## 2023-03-01 DIAGNOSIS — N186 End stage renal disease: Secondary | ICD-10-CM | POA: Insufficient documentation

## 2023-03-01 DIAGNOSIS — Z7982 Long term (current) use of aspirin: Secondary | ICD-10-CM | POA: Insufficient documentation

## 2023-03-01 DIAGNOSIS — R197 Diarrhea, unspecified: Secondary | ICD-10-CM | POA: Diagnosis not present

## 2023-03-01 DIAGNOSIS — Z794 Long term (current) use of insulin: Secondary | ICD-10-CM | POA: Insufficient documentation

## 2023-03-01 DIAGNOSIS — I120 Hypertensive chronic kidney disease with stage 5 chronic kidney disease or end stage renal disease: Secondary | ICD-10-CM | POA: Diagnosis not present

## 2023-03-01 DIAGNOSIS — Z992 Dependence on renal dialysis: Secondary | ICD-10-CM | POA: Insufficient documentation

## 2023-03-01 DIAGNOSIS — R109 Unspecified abdominal pain: Secondary | ICD-10-CM | POA: Insufficient documentation

## 2023-03-01 LAB — CBC
HCT: 35.5 % — ABNORMAL LOW (ref 39.0–52.0)
Hemoglobin: 10.8 g/dL — ABNORMAL LOW (ref 13.0–17.0)
MCH: 25.1 pg — ABNORMAL LOW (ref 26.0–34.0)
MCHC: 30.4 g/dL (ref 30.0–36.0)
MCV: 82.4 fL (ref 80.0–100.0)
Platelets: 307 10*3/uL (ref 150–400)
RBC: 4.31 MIL/uL (ref 4.22–5.81)
RDW: 20.5 % — ABNORMAL HIGH (ref 11.5–15.5)
WBC: 8.1 10*3/uL (ref 4.0–10.5)
nRBC: 0 % (ref 0.0–0.2)

## 2023-03-01 LAB — COMPREHENSIVE METABOLIC PANEL
ALT: 19 U/L (ref 0–44)
AST: 23 U/L (ref 15–41)
Albumin: 2.9 g/dL — ABNORMAL LOW (ref 3.5–5.0)
Alkaline Phosphatase: 195 U/L — ABNORMAL HIGH (ref 38–126)
Anion gap: 17 — ABNORMAL HIGH (ref 5–15)
BUN: 50 mg/dL — ABNORMAL HIGH (ref 6–20)
CO2: 24 mmol/L (ref 22–32)
Calcium: 8.3 mg/dL — ABNORMAL LOW (ref 8.9–10.3)
Chloride: 96 mmol/L — ABNORMAL LOW (ref 98–111)
Creatinine, Ser: 9.52 mg/dL — ABNORMAL HIGH (ref 0.61–1.24)
GFR, Estimated: 6 mL/min — ABNORMAL LOW (ref >60)
Glucose, Bld: 207 mg/dL — ABNORMAL HIGH (ref 70–99)
Potassium: 4.2 mmol/L (ref 3.5–5.1)
Sodium: 137 mmol/L (ref 135–145)
Total Bilirubin: 0.7 mg/dL (ref 0.3–1.2)
Total Protein: 7.3 g/dL (ref 6.5–8.1)

## 2023-03-01 LAB — LIPASE, BLOOD: Lipase: 26 U/L (ref 11–51)

## 2023-03-01 NOTE — ED Triage Notes (Addendum)
[place] [facility] [place].  Pt c/o abdominal pain for 1 week.  Denies abdominal pain.  Complains only of diarrhea.  Pt only received 2.5 hours of his dialysis today because he "was late".  VS stable per [facility].

## 2023-03-01 NOTE — ED Notes (Signed)
Per Dr Madilyn Hook, pt stable enough to go to waiting room without being visualized by a provider.

## 2023-03-02 ENCOUNTER — Encounter (HOSPITAL_COMMUNITY): Payer: Self-pay

## 2023-03-02 ENCOUNTER — Emergency Department (HOSPITAL_COMMUNITY): Payer: 59

## 2023-03-02 ENCOUNTER — Other Ambulatory Visit: Payer: Self-pay

## 2023-03-02 ENCOUNTER — Emergency Department (HOSPITAL_COMMUNITY)
Admission: EM | Admit: 2023-03-02 | Discharge: 2023-03-03 | Disposition: A | Discharge status: Home / Self Care | Payer: 59 | Source: Home / Self Care | Attending: Emergency Medicine | Admitting: Emergency Medicine

## 2023-03-02 DIAGNOSIS — R1084 Generalized abdominal pain: Secondary | ICD-10-CM | POA: Diagnosis not present

## 2023-03-02 DIAGNOSIS — N186 End stage renal disease: Secondary | ICD-10-CM | POA: Diagnosis not present

## 2023-03-02 DIAGNOSIS — R0902 Hypoxemia: Secondary | ICD-10-CM | POA: Diagnosis not present

## 2023-03-02 DIAGNOSIS — I120 Hypertensive chronic kidney disease with stage 5 chronic kidney disease or end stage renal disease: Secondary | ICD-10-CM | POA: Diagnosis not present

## 2023-03-02 DIAGNOSIS — Z992 Dependence on renal dialysis: Secondary | ICD-10-CM | POA: Insufficient documentation

## 2023-03-02 DIAGNOSIS — R739 Hyperglycemia, unspecified: Secondary | ICD-10-CM | POA: Diagnosis not present

## 2023-03-02 LAB — CBG MONITORING, ED: Glucose-Capillary: 185 mg/dL — ABNORMAL HIGH (ref 70–99)

## 2023-03-02 NOTE — ED Notes (Signed)
Pt given beverage and crackers. States he is taking it to go. Pt not wanting to leave the department. When informed him he was being d/c he states he was checking back in. Security called for assistance to escort pt and son out of  the department. Pt given bus pass x1 per request. States he needs another one for his son. Advised him only one bus pass can be given.

## 2023-03-02 NOTE — ED Provider Notes (Signed)
[place] EMERGENCY DEPARTMENT AT [facility] Provider Note   CSN: [id number] Arrival date & time: 03/02/23  2001     History Chief Complaint  Patient presents with   Abdominal Pain    HPI [name] is a 52 y.o. male presenting for chief complaint of chronic abdominal pain. However states that he left dialysis after only 2 hours yesterday and that he needs dialysis tonight and "feels the fluid" Patient's recorded medical, surgical, social, medication list and allergies were reviewed in the Snapshot window as part of the initial history.   Review of Systems   Review of Systems  Constitutional:  Negative for chills and fever.  HENT:  Negative for ear pain and sore throat.   Eyes:  Negative for pain and visual disturbance.  Respiratory:  Negative for cough and shortness of breath.   Cardiovascular:  Negative for chest pain and palpitations.  Gastrointestinal:  Positive for abdominal distention, abdominal pain, diarrhea and nausea. Negative for vomiting.  Genitourinary:  Negative for dysuria and hematuria.  Musculoskeletal:  Negative for arthralgias and back pain.  Skin:  Negative for color change and rash.  Neurological:  Negative for seizures and syncope.  All other systems reviewed and are negative.   Physical Exam Updated Vital Signs BP (!) 163/88   Pulse 81   Temp 98.8 F (37.1 C)   Resp 17   Ht 5\' 7"  (1.702 m)   Wt 80.1 kg   SpO2 96%   BMI 27.66 kg/m  Physical Exam Vitals and nursing note reviewed.  Constitutional:      General: He is not in acute distress.    Appearance: He is well-developed.  HENT:     Head: Normocephalic and atraumatic.  Eyes:     Conjunctiva/sclera: Conjunctivae normal.  Cardiovascular:     Rate and Rhythm: Normal rate and regular rhythm.     Heart sounds: No murmur heard. Pulmonary:     Effort: Pulmonary effort is normal. No respiratory distress.     Breath sounds: Normal breath sounds.  Abdominal:     Palpations:  Abdomen is soft.     Tenderness: There is no abdominal tenderness.  Musculoskeletal:        General: No swelling.     Cervical back: Neck supple.  Skin:    General: Skin is warm and dry.     Capillary Refill: Capillary refill takes less than 2 seconds.  Neurological:     Mental Status: He is alert.  Psychiatric:        Mood and Affect: Mood normal.      ED Course/ Medical Decision Making/ A&P    Procedures Procedures   Medications Ordered in ED Medications - No data to display  Medical Decision Making:   Sevastian is a chronically ill 52 year old male with ESRD limited dialysis access coming in with a chief complaint of shortness of breath which she attributes to volume overload and abdominal pain which has been evaluated extensively in the past and is chronic in nature. Notably when I walk in the room he is sitting upright in the bed watching the TV in no acute distress.  When I finished my evaluation yes may turn the lights off so he could go to sleep.  He does not appear to be any acute distress this time.  On room air during my evaluation. Will evaluate his renal function/need for dialysis with BMP and chest x-ray to evaluate for evidence of pulmonary edema he remains on  room air and x-ray is clear given recent dialysis 36 hours ago he would be stable for discharge with outpatient follow-up with nephrology for definitive care and management.  Labs and x-ray pending at this time.  Care patient handed off to overnight provider pending assessment of the studies.  If negative for any acute pathology likely can follow-up in the outpatient setting similar to prior eval's.   Emergency Department Medication Summary:   Medications - No data to display      Clinical Impression:  1. ESRD (end stage renal disease) (HCC)      Data Unavailable   Final Clinical Impression(s) / ED Diagnoses Final diagnoses:  ESRD (end stage renal disease) (HCC)    Rx / DC Orders ED Discharge  Orders     None         Glyn Ade, MD 03/02/23 2316

## 2023-03-02 NOTE — ED Notes (Signed)
ED Provider at bedside. 

## 2023-03-02 NOTE — ED Provider Notes (Signed)
[place] EMERGENCY DEPARTMENT AT [facility] Provider Note   CSN: [id number] Arrival date & time: 03/01/23  2256     History  Chief Complaint  Patient presents with   Abdominal Pain    [name] is a 52 y.o. male.  52 year old male [place] EMS [place] dep[place] he [place]. Reports abdominal pain, diarrhea. Attends dialysis Tues/Thurs/Saturday, last had 2.5 hrs dialysis yesterday because he was late, did not have dialysis on Thursday. Denies fevers, nausea, vomiting. Denies alcohol use.        Home Medications Prior to Admission medications   Medication Sig Start Date End Date Taking? Authorizing Provider  acetaminophen (TYLENOL) 500 MG tablet Take 1 tablet (500 mg total) by mouth every 8 (eight) hours as needed for mild pain. 05/28/22   Leroy Sea, MD  ASPIRIN LOW DOSE 81 MG tablet Take 1 tablet (81 mg total) by mouth daily. 05/28/22   Leroy Sea, MD  calcitRIOL (ROCALTROL) 0.25 MCG capsule Take 1 capsule (0.25 mcg total) by mouth every Tuesday, Thursday, and Saturday at 6 PM. 10/26/22   Ghimire, Werner Lean, MD  calcium acetate (PHOSLO) 667 MG tablet Take 3 tablets (2,001 mg total) by mouth 3 (three) times daily. 12/12/22     carvedilol (COREG) 12.5 MG tablet Take 1 tablet (12.5 mg total) by mouth 2 (two) times daily with a meal. 02/15/23   Zannie Cove, MD  glucose blood test strip Use to check fasting blood sugar once daily. 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 10/26/22   [provider]  hydrALAZINE (APRESOLINE) 25 MG tablet Take 1 tablet (25 mg total) by mouth 3 (three) times daily. 02/15/23   Zannie Cove, MD  insulin aspart (NOVOLOG FLEXPEN) 100 UNIT/ML FlexPen 0-9 Units, Subcutaneous, 3 times daily with meals CBG < 70: Implement Hypoglycemia measures CBG 70 - 120: 0 units CBG 121 - 150: 1 unit CBG 151 - 200: 2 units CBG 201 - 250: 3  units CBG 251 - 300: 5 units CBG 301 - 350: 7 units CBG 351 - 400: 9 units CBG > 400: call MD 10/26/22   Maretta Bees, MD  insulin glargine-yfgn (SEMGLEE) 100 UNIT/ML injection Inject 0.07 mLs (7 Units total) into the skin at bedtime. 10/26/22   Ghimire, Werner Lean, MD  Insulin Pen Needle 32G X 4 MM MISC Use with insulin pens 05/28/22   Leroy Sea, MD  isosorbide mononitrate (IMDUR) 60 MG 24 hr tablet Take 1 tablet (60 mg total) by mouth daily. 10/26/22   Ghimire, Werner Lean, MD  melatonin 3 MG TABS tablet Take 1 tablet (3 mg total) by mouth at bedtime as needed (insomnia). 10/26/22   Ghimire, Werner Lean, MD  mupirocin ointment (BACTROBAN) 2 % Apply 1 Application topically 3 (three) times daily. 01/07/23   Mardella Layman, MD  PROTONIX 40 MG tablet Take 1 tablet (40 mg total) by mouth daily. 05/25/22   Leroy Sea, MD  simethicone (MYLICON) 80 MG chewable tablet Chew 1 tablet (80 mg total) by mouth 4 (four) times daily for 5 days. 11/22/22 12/17/22  Rodolph Bong, MD  gabapentin (NEURONTIN) 300 MG capsule Take 1 capsule (300 mg total) by mouth 3 (three) times daily. Patient not taking: Reported on 07/27/2019 07/29/18 08/30/19  Kallie Locks, FNP  sildenafil (VIAGRA) 25 MG tablet Take 1 tablet (25 mg  total) by mouth as needed for erectile dysfunction. for erectile dysfunction. Patient not taking: Reported on 08/11/2020 10/13/19 11/27/20  Kallie Locks, FNP      Allergies    Sulfa antibiotics, Other, Bactrim [sulfamethoxazole-trimethoprim], and Cephalexin    Review of Systems   Review of Systems Negative except as per HPI Physical Exam Updated Vital Signs BP (S) (!) 182/82 (BP Location: Right Leg) Comment (BP Location): per pt request  Pulse 80   Temp 98.3 F (36.8 C) (Oral)   Resp 16   Ht 5\' 7"  (1.702 m)   Wt 79.2 kg   SpO2 92%   BMI 27.35 kg/m  Physical Exam Vitals and nursing note reviewed.  Constitutional:      General: He is not in acute distress.    Appearance: He is  well-developed. He is not diaphoretic.  HENT:     Head: Normocephalic and atraumatic.  Cardiovascular:     Rate and Rhythm: Normal rate and regular rhythm.     Pulses: Normal pulses.     Heart sounds: Normal heart sounds.  Pulmonary:     Effort: Pulmonary effort is normal.     Breath sounds: Normal breath sounds.  Abdominal:     General: There is distension.     Palpations: Abdomen is soft.     Tenderness: There is no abdominal tenderness.  Musculoskeletal:     Comments: Trace bilateral lower ext edema  Skin:    General: Skin is warm and dry.  Neurological:     Mental Status: He is alert and oriented to person, place, and time.  Psychiatric:        Behavior: Behavior normal.     ED Results / Procedures / Treatments   Labs (all labs ordered are listed, but only abnormal results are displayed) Labs Reviewed  COMPREHENSIVE METABOLIC PANEL - Abnormal; Notable for the following components:      Result Value   Chloride 96 (*)    Glucose, Bld 207 (*)    BUN 50 (*)    Creatinine, Ser 9.52 (*)    Calcium 8.3 (*)    Albumin 2.9 (*)    Alkaline Phosphatase 195 (*)    GFR, Estimated 6 (*)    Anion gap 17 (*)    All other components within normal limits  CBC - Abnormal; Notable for the following components:   Hemoglobin 10.8 (*)    HCT 35.5 (*)    MCH 25.1 (*)    RDW 20.5 (*)    All other components within normal limits  LIPASE, BLOOD    EKG None  Radiology No results found.  Procedures Procedures    Medications Ordered in ED Medications - No data to display  ED Course/ Medical Decision Making/ A&P                                 Medical Decision Making Amount and/or Complexity of Data Reviewed Labs: ordered.   52 year old male was brought to the emergency room by EMS [place].  Patient states that he is been having abdominal pain with diarrhea.  Patient lays on his left side facing the wall to provide history, would prefer  to sleep rather than participate in his care today.  States that he only had 2 and half hours of dialysis yesterday because he arrived late to the appointment.  Patient with history of  noncompliance with his dialysis.  He has clear lung sounds today with minimal lower extremity edema.  His abdomen is distended, review of records, did have ascites on CT in July 2024, no history of paracentesis.  Labs without significant findings compared to prior on file, potassium is normal.  Abdomen is soft and nontender, doubt SBP.  Patient can be discharged to follow-up with PCP, follow-up with dialysis clinic as scheduled.  Return as needed.        Final Clinical Impression(s) / ED Diagnoses Final diagnoses:  Generalized abdominal pain  Diarrhea, unspecified type    Rx / DC Orders ED Discharge Orders     None         Jeannie Fend, PA-C 03/02/23 1610    Glynn Octave, MD 03/02/23 [phone]

## 2023-03-02 NOTE — ED Notes (Signed)
Pt noted to occasionally desat to 78-80%.  During these episodes, pt's nasal cannula found removed from his nose.  Pt's saturations increase quickly back to 90s when he is woken up.

## 2023-03-02 NOTE — ED Triage Notes (Signed)
C/o abd Pain w/ diarrhea x1 week. Denis any Nausea /vomiting. Last Dialysis was on Saturday.

## 2023-03-03 LAB — BASIC METABOLIC PANEL
Anion gap: 17 — ABNORMAL HIGH (ref 5–15)
BUN: 56 mg/dL — ABNORMAL HIGH (ref 6–20)
CO2: 18 mmol/L — ABNORMAL LOW (ref 22–32)
Calcium: 8 mg/dL — ABNORMAL LOW (ref 8.9–10.3)
Chloride: 101 mmol/L (ref 98–111)
Creatinine, Ser: 10.29 mg/dL — ABNORMAL HIGH (ref 0.61–1.24)
GFR, Estimated: 6 mL/min — ABNORMAL LOW (ref >60)
Glucose, Bld: 121 mg/dL — ABNORMAL HIGH (ref 70–99)
Potassium: 4.5 mmol/L (ref 3.5–5.1)
Sodium: 136 mmol/L (ref 135–145)

## 2023-03-03 LAB — LIPASE, BLOOD: Lipase: 31 U/L (ref 11–51)

## 2023-03-03 NOTE — ED Notes (Signed)
This RN attempted to obtain labs from pt; unable to obtain at this time.

## 2023-03-03 NOTE — ED Provider Notes (Signed)
I assumed care at signout to follow-up on labs and imaging.  Patient refused chest x-ray.  Potassium is normal.  This is patient's 35th ER visit in 6 months. He is now off oxygen pulse ox 93% He has been sleeping without difficulties.  He has been asking to have his food warmed up in the microwave Patient could be discharged   Zadie Rhine, MD 03/03/23 0222

## 2023-03-03 NOTE — ED Notes (Signed)
Pt refusing to acknowledge this RN for discharge.  This RN notified security to assist with discharge.  Pt provided with AVS and bus pass.

## 2023-03-03 NOTE — ED Notes (Signed)
Pt asking staff to heat up his noodle cup.

## 2023-03-04 ENCOUNTER — Encounter (HOSPITAL_COMMUNITY): Payer: Self-pay

## 2023-03-04 ENCOUNTER — Other Ambulatory Visit: Payer: Self-pay

## 2023-03-04 ENCOUNTER — Emergency Department (HOSPITAL_COMMUNITY)
Admission: EM | Admit: 2023-03-04 | Discharge: 2023-03-05 | Disposition: A | Discharge status: Home / Self Care | Payer: 59 | Attending: Emergency Medicine | Admitting: Emergency Medicine

## 2023-03-04 DIAGNOSIS — Z794 Long term (current) use of insulin: Secondary | ICD-10-CM | POA: Diagnosis not present

## 2023-03-04 DIAGNOSIS — Z5321 Procedure and treatment not carried out due to patient leaving prior to being seen by health care provider: Secondary | ICD-10-CM | POA: Insufficient documentation

## 2023-03-04 DIAGNOSIS — R112 Nausea with vomiting, unspecified: Secondary | ICD-10-CM | POA: Diagnosis not present

## 2023-03-04 DIAGNOSIS — R197 Diarrhea, unspecified: Secondary | ICD-10-CM | POA: Insufficient documentation

## 2023-03-04 DIAGNOSIS — R1084 Generalized abdominal pain: Secondary | ICD-10-CM | POA: Insufficient documentation

## 2023-03-04 DIAGNOSIS — Z7982 Long term (current) use of aspirin: Secondary | ICD-10-CM | POA: Diagnosis not present

## 2023-03-04 DIAGNOSIS — E8770 Fluid overload, unspecified: Secondary | ICD-10-CM | POA: Diagnosis not present

## 2023-03-04 DIAGNOSIS — R109 Unspecified abdominal pain: Secondary | ICD-10-CM | POA: Diagnosis not present

## 2023-03-04 NOTE — ED Triage Notes (Signed)
Complaining of abdominal pain, some nausea, vomits sometimes and he feels like his stomach is getting bigger. Said he feels like it is bloated. Going on for about a month.

## 2023-03-05 ENCOUNTER — Emergency Department (HOSPITAL_COMMUNITY)
Admission: EM | Admit: 2023-03-05 | Discharge: 2023-03-06 | Discharge status: Against Medical Advice | Payer: 59 | Source: Home / Self Care

## 2023-03-05 ENCOUNTER — Other Ambulatory Visit: Payer: Self-pay

## 2023-03-05 ENCOUNTER — Emergency Department (HOSPITAL_COMMUNITY)
Admission: EM | Admit: 2023-03-05 | Discharge: 2023-03-05 | Disposition: A | Discharge status: Home / Self Care | Payer: 59 | Source: Home / Self Care | Attending: Emergency Medicine | Admitting: Emergency Medicine

## 2023-03-05 ENCOUNTER — Encounter (HOSPITAL_COMMUNITY): Payer: Self-pay

## 2023-03-05 DIAGNOSIS — Z7982 Long term (current) use of aspirin: Secondary | ICD-10-CM | POA: Insufficient documentation

## 2023-03-05 DIAGNOSIS — Z5321 Procedure and treatment not carried out due to patient leaving prior to being seen by health care provider: Secondary | ICD-10-CM | POA: Insufficient documentation

## 2023-03-05 DIAGNOSIS — R109 Unspecified abdominal pain: Secondary | ICD-10-CM | POA: Insufficient documentation

## 2023-03-05 DIAGNOSIS — E8770 Fluid overload, unspecified: Secondary | ICD-10-CM | POA: Diagnosis not present

## 2023-03-05 DIAGNOSIS — R112 Nausea with vomiting, unspecified: Secondary | ICD-10-CM | POA: Insufficient documentation

## 2023-03-05 DIAGNOSIS — R197 Diarrhea, unspecified: Secondary | ICD-10-CM | POA: Insufficient documentation

## 2023-03-05 DIAGNOSIS — R1111 Vomiting without nausea: Secondary | ICD-10-CM | POA: Diagnosis not present

## 2023-03-05 DIAGNOSIS — R110 Nausea: Secondary | ICD-10-CM | POA: Diagnosis not present

## 2023-03-05 DIAGNOSIS — R1084 Generalized abdominal pain: Secondary | ICD-10-CM | POA: Insufficient documentation

## 2023-03-05 LAB — COMPREHENSIVE METABOLIC PANEL
ALT: 15 U/L (ref 0–44)
AST: 21 U/L (ref 15–41)
Albumin: 3.2 g/dL — ABNORMAL LOW (ref 3.5–5.0)
Alkaline Phosphatase: 181 U/L — ABNORMAL HIGH (ref 38–126)
Anion gap: 16 — ABNORMAL HIGH (ref 5–15)
BUN: 39 mg/dL — ABNORMAL HIGH (ref 6–20)
CO2: 24 mmol/L (ref 22–32)
Calcium: 8.7 mg/dL — ABNORMAL LOW (ref 8.9–10.3)
Chloride: 99 mmol/L (ref 98–111)
Creatinine, Ser: 9.09 mg/dL — ABNORMAL HIGH (ref 0.61–1.24)
GFR, Estimated: 7 mL/min — ABNORMAL LOW (ref >60)
Glucose, Bld: 142 mg/dL — ABNORMAL HIGH (ref 70–99)
Potassium: 3.8 mmol/L (ref 3.5–5.1)
Sodium: 139 mmol/L (ref 135–145)
Total Bilirubin: 0.5 mg/dL (ref 0.3–1.2)
Total Protein: 8.2 g/dL — ABNORMAL HIGH (ref 6.5–8.1)

## 2023-03-05 LAB — CBC
HCT: 41.2 % (ref 39.0–52.0)
Hemoglobin: 12.1 g/dL — ABNORMAL LOW (ref 13.0–17.0)
MCH: 24.5 pg — ABNORMAL LOW (ref 26.0–34.0)
MCHC: 29.4 g/dL — ABNORMAL LOW (ref 30.0–36.0)
MCV: 83.4 fL (ref 80.0–100.0)
Platelets: 335 10*3/uL (ref 150–400)
RBC: 4.94 MIL/uL (ref 4.22–5.81)
RDW: 21.6 % — ABNORMAL HIGH (ref 11.5–15.5)
WBC: 6.7 10*3/uL (ref 4.0–10.5)
nRBC: 0 % (ref 0.0–0.2)

## 2023-03-05 LAB — LIPASE, BLOOD: Lipase: 23 U/L (ref 11–51)

## 2023-03-05 MED ORDER — ONDANSETRON 4 MG PO TBDP: 4.0000 mg | ORAL_TABLET | Freq: Once | ORAL | Status: DC

## 2023-03-05 MED ORDER — ONDANSETRON 4 MG PO TBDP
4.0000 mg | ORAL_TABLET | ORAL | 0 refills | Status: DC | PRN
Start: 2023-03-05 — End: 2023-05-09
  Filled 2023-03-05: qty 18, 3d supply, fill #0

## 2023-03-05 NOTE — ED Provider Notes (Signed)
[place] EMERGENCY DEPARTMENT AT [facility] Provider Note   CSN: [id number] Arrival date & time: 03/04/23  1913     History  Chief Complaint  Patient presents with   Abdominal Pain    [name] is a 52 y.o. male.  Patient states he has belly pain.  Will answer any other questions unless I warm up his microwavable meal.  Does deny fevers nausea, vomiting and diarrhea.No known sick contacts.  Seen here a couple days ago for the same.   Abdominal Pain      Home Medications Prior to Admission medications   Medication Sig Start Date End Date Taking? Authorizing Provider  acetaminophen (TYLENOL) 500 MG tablet Take 1 tablet (500 mg total) by mouth every 8 (eight) hours as needed for mild pain. 05/28/22   Leroy Sea, MD  ASPIRIN LOW DOSE 81 MG tablet Take 1 tablet (81 mg total) by mouth daily. 05/28/22   Leroy Sea, MD  calcitRIOL (ROCALTROL) 0.25 MCG capsule Take 1 capsule (0.25 mcg total) by mouth every Tuesday, Thursday, and Saturday at 6 PM. 10/26/22   Ghimire, Werner Lean, MD  calcium acetate (PHOSLO) 667 MG tablet Take 3 tablets (2,001 mg total) by mouth 3 (three) times daily. 12/12/22     carvedilol (COREG) 12.5 MG tablet Take 1 tablet (12.5 mg total) by mouth 2 (two) times daily with a meal. 02/15/23   Zannie Cove, MD  glucose blood test strip Use to check fasting blood sugar once daily. 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 10/26/22   [provider]  hydrALAZINE (APRESOLINE) 25 MG tablet Take 1 tablet (25 mg total) by mouth 3 (three) times daily. 02/15/23   Zannie Cove, MD  insulin aspart (NOVOLOG FLEXPEN) 100 UNIT/ML FlexPen 0-9 Units, Subcutaneous, 3 times daily with meals CBG < 70: Implement Hypoglycemia measures CBG 70 - 120: 0 units CBG 121 - 150: 1 unit CBG 151 - 200: 2 units CBG 201 - 250: 3 units CBG 251 - 300: 5 units CBG 301 - 350: 7 units CBG  351 - 400: 9 units CBG > 400: call MD 10/26/22   Maretta Bees, MD  insulin glargine-yfgn (SEMGLEE) 100 UNIT/ML injection Inject 0.07 mLs (7 Units total) into the skin at bedtime. 10/26/22   Ghimire, Werner Lean, MD  Insulin Pen Needle 32G X 4 MM MISC Use with insulin pens 05/28/22   Leroy Sea, MD  isosorbide mononitrate (IMDUR) 60 MG 24 hr tablet Take 1 tablet (60 mg total) by mouth daily. 10/26/22   Ghimire, Werner Lean, MD  melatonin 3 MG TABS tablet Take 1 tablet (3 mg total) by mouth at bedtime as needed (insomnia). 10/26/22   Ghimire, Werner Lean, MD  mupirocin ointment (BACTROBAN) 2 % Apply 1 Application topically 3 (three) times daily. 01/07/23   Mardella Layman, MD  PROTONIX 40 MG tablet Take 1 tablet (40 mg total) by mouth daily. 05/25/22   Leroy Sea, MD  simethicone (MYLICON) 80 MG chewable tablet Chew 1 tablet (80 mg total) by mouth 4 (four) times daily for 5 days. 11/22/22 12/17/22  Rodolph Bong, MD  gabapentin (NEURONTIN) 300 MG capsule Take 1 capsule (300 mg total) by mouth 3 (three) times daily. Patient not taking: Reported on 07/27/2019 07/29/18 08/30/19  Kallie Locks, FNP  sildenafil (VIAGRA) 25 MG tablet Take 1 tablet (25 mg total) by mouth  as needed for erectile dysfunction. for erectile dysfunction. Patient not taking: Reported on 08/11/2020 10/13/19 11/27/20  Kallie Locks, FNP      Allergies    Sulfa antibiotics, Other, Bactrim [sulfamethoxazole-trimethoprim], and Cephalexin    Review of Systems   Review of Systems  Gastrointestinal:  Positive for abdominal pain.    Physical Exam Updated Vital Signs BP (!) 149/78 (BP Location: Left Arm)   Pulse 86   Temp 98.2 F (36.8 C) (Oral)   Resp (!) 22   Ht 5\' 7"  (1.702 m)   Wt 81.6 kg   SpO2 90%   BMI 28.19 kg/m  Physical Exam Vitals and nursing note reviewed.  Constitutional:      Appearance: He is well-developed.  HENT:     Head: Normocephalic and atraumatic.  Cardiovascular:     Rate and Rhythm: Normal  rate.  Pulmonary:     Effort: Pulmonary effort is normal. No respiratory distress.  Abdominal:     General: There is distension.     Tenderness: There is generalized abdominal tenderness.  Musculoskeletal:        General: Normal range of motion.     Cervical back: Normal range of motion.  Neurological:     Mental Status: He is alert.     ED Results / Procedures / Treatments   Labs (all labs ordered are listed, but only abnormal results are displayed) Labs Reviewed - No data to display  EKG None  Radiology No results found.  Procedures Procedures    Medications Ordered in ED Medications - No data to display  ED Course/ Medical Decision Making/ A&P                                 Medical Decision Making  Benign abdomen.  Vital signs stable relative to his baseline.  Will give more information and thus we warmed up his meal.  I do not feel this is indicated and is stable for discharge.  Final Clinical Impression(s) / ED Diagnoses Final diagnoses:  Generalized abdominal pain    Rx / DC Orders ED Discharge Orders     None         Robi Dewolfe, Barbara Cower, MD 03/05/23 [phone]

## 2023-03-05 NOTE — ED Notes (Signed)
Patient refusing to leave, security called to escort patient out. Discharge papers given to patient and place in belongings bag. Patient refused discharge vital signs.

## 2023-03-05 NOTE — ED Notes (Addendum)
Patient sitting in wheelchair in room. Patient refused to get in the bed after being asked multiple times.

## 2023-03-05 NOTE — ED Triage Notes (Signed)
Patient reports chronic generalized abdominal pain for for several months with n/v/d.

## 2023-03-05 NOTE — ED Provider Triage Note (Signed)
Emergency Medicine Provider Triage Evaluation Note  [name] , a 52 y.o. male  was evaluated in triage.  Pt complains of abdominal pain, nausea and diarrhea.  Past history significant for diabetes.  Reports has been having ongoing abdominal pain for the last week or so and having difficulty tolerating oral intake due to nausea and lack of appetite.  Denies any recent fevers but does endorse some chills.  No urinary symptoms, chest pain or shortness of breath.  States that he is a dialysis patient currently receiving dialysis Tuesday, Thursday, Saturday.  Has not missed any dialysis sessions recently.  Review of Systems  Positive: As above Negative: As above  Physical Exam  BP (!) 160/88   Pulse 83   Temp 98.3 F (36.8 C)   Resp 16   SpO2 92%  Gen:   Awake, no distress   Resp:  Normal effort  MSK:   Moves extremities without difficulty  Other:  Generalized abdominal tenderness, some distention noted  Medical Decision Making  Medically screening exam initiated at 2:10 PM.  Appropriate orders placed.  [name] was informed that the remainder of the evaluation will be completed by another provider, this initial triage assessment does not replace that evaluation, and the importance of remaining in the ED until their evaluation is complete.     Douglas Knudsen, PA-C 03/05/23 1410

## 2023-03-05 NOTE — ED Notes (Signed)
Pt reports he does not make urine during triage when asked for a sample.

## 2023-03-05 NOTE — ED Provider Notes (Signed)
[place] EMERGENCY DEPARTMENT AT [facility] Provider Note   CSN: [id number] Arrival date & time: 03/05/23  1341     History  Chief Complaint  Patient presents with   Abdominal Pain   Emesis   Nausea   Diarrhea    [name] is a 52 y.o. male.  52 yo M with a cc of abdominal pain.  He tells me this been going on for at least a year.  When prompted why he came back today told me that he had some nausea and vomiting.  No fevers.  No change to his abdominal discomfort.   Abdominal Pain Associated symptoms: diarrhea and vomiting   Emesis Associated symptoms: abdominal pain and diarrhea   Diarrhea Associated symptoms: abdominal pain and vomiting        Home Medications Prior to Admission medications   Medication Sig Start Date End Date Taking? Authorizing Provider  ondansetron (ZOFRAN-ODT) 4 MG disintegrating tablet Take 1 tablet (4 mg total) by mouth every 4 (four) hours as needed for nausea/vomiting. 03/05/23  Yes Melene Plan, DO  acetaminophen (TYLENOL) 500 MG tablet Take 1 tablet (500 mg total) by mouth every 8 (eight) hours as needed for mild pain. 05/28/22   Leroy Sea, MD  ASPIRIN LOW DOSE 81 MG tablet Take 1 tablet (81 mg total) by mouth daily. 05/28/22   Leroy Sea, MD  calcitRIOL (ROCALTROL) 0.25 MCG capsule Take 1 capsule (0.25 mcg total) by mouth every Tuesday, Thursday, and Saturday at 6 PM. 10/26/22   Ghimire, Werner Lean, MD  calcium acetate (PHOSLO) 667 MG tablet Take 3 tablets (2,001 mg total) by mouth 3 (three) times daily. 12/12/22     carvedilol (COREG) 12.5 MG tablet Take 1 tablet (12.5 mg total) by mouth 2 (two) times daily with a meal. 02/15/23   Zannie Cove, MD  glucose blood test strip Use to check fasting blood sugar once daily. 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 10/26/22   [provider]  hydrALAZINE (APRESOLINE) 25 MG tablet  Take 1 tablet (25 mg total) by mouth 3 (three) times daily. 02/15/23   Zannie Cove, MD  insulin aspart (NOVOLOG FLEXPEN) 100 UNIT/ML FlexPen 0-9 Units, Subcutaneous, 3 times daily with meals CBG < 70: Implement Hypoglycemia measures CBG 70 - 120: 0 units CBG 121 - 150: 1 unit CBG 151 - 200: 2 units CBG 201 - 250: 3 units CBG 251 - 300: 5 units CBG 301 - 350: 7 units CBG 351 - 400: 9 units CBG > 400: call MD 10/26/22   Maretta Bees, MD  insulin glargine-yfgn (SEMGLEE) 100 UNIT/ML injection Inject 0.07 mLs (7 Units total) into the skin at bedtime. 10/26/22   Ghimire, Werner Lean, MD  Insulin Pen Needle 32G X 4 MM MISC Use with insulin pens 05/28/22   Leroy Sea, MD  isosorbide mononitrate (IMDUR) 60 MG 24 hr tablet Take 1 tablet (60 mg total) by mouth daily. 10/26/22   Ghimire, Werner Lean, MD  melatonin 3 MG TABS tablet Take 1 tablet (3 mg total) by mouth at bedtime as needed (insomnia). 10/26/22   Ghimire, Werner Lean, MD  mupirocin ointment (BACTROBAN) 2 % Apply 1 Application topically 3 (three) times daily. 01/07/23   Mardella Layman, MD  PROTONIX 40 MG tablet Take 1 tablet (40 mg total) by mouth daily. 05/25/22   Leroy Sea, MD  simethicone (  MYLICON) 80 MG chewable tablet Chew 1 tablet (80 mg total) by mouth 4 (four) times daily for 5 days. 11/22/22 12/17/22  Rodolph Bong, MD  gabapentin (NEURONTIN) 300 MG capsule Take 1 capsule (300 mg total) by mouth 3 (three) times daily. Patient not taking: Reported on 07/27/2019 07/29/18 08/30/19  Kallie Locks, FNP  sildenafil (VIAGRA) 25 MG tablet Take 1 tablet (25 mg total) by mouth as needed for erectile dysfunction. for erectile dysfunction. Patient not taking: Reported on 08/11/2020 10/13/19 11/27/20  Kallie Locks, FNP      Allergies    Sulfa antibiotics, Other, Bactrim [sulfamethoxazole-trimethoprim], and Cephalexin    Review of Systems   Review of Systems  Gastrointestinal:  Positive for abdominal pain, diarrhea and vomiting.    Physical  Exam Updated Vital Signs BP (!) 160/88   Pulse 83   Temp 98.3 F (36.8 C)   Resp 16   SpO2 92%  Physical Exam Vitals and nursing note reviewed.  Constitutional:      Appearance: He is well-developed.  HENT:     Head: Normocephalic and atraumatic.  Eyes:     Pupils: Pupils are equal, round, and reactive to light.  Neck:     Vascular: No JVD.  Cardiovascular:     Rate and Rhythm: Normal rate and regular rhythm.     Heart sounds: No murmur heard.    No friction rub. No gallop.  Pulmonary:     Effort: No respiratory distress.     Breath sounds: No wheezing.  Abdominal:     General: There is no distension.     Tenderness: There is no abdominal tenderness. There is no guarding or rebound.  Musculoskeletal:        General: Normal range of motion.     Cervical back: Normal range of motion and neck supple.  Skin:    Coloration: Skin is not pale.     Findings: No rash.  Neurological:     Mental Status: He is alert and oriented to person, place, and time.  Psychiatric:        Behavior: Behavior normal.     ED Results / Procedures / Treatments   Labs (all labs ordered are listed, but only abnormal results are displayed) Labs Reviewed  COMPREHENSIVE METABOLIC PANEL - Abnormal; Notable for the following components:      Result Value   Glucose, Bld 142 (*)    BUN 39 (*)    Creatinine, Ser 9.09 (*)    Calcium 8.7 (*)    Total Protein 8.2 (*)    Albumin 3.2 (*)    Alkaline Phosphatase 181 (*)    GFR, Estimated 7 (*)    Anion gap 16 (*)    All other components within normal limits  CBC - Abnormal; Notable for the following components:   Hemoglobin 12.1 (*)    MCH 24.5 (*)    MCHC 29.4 (*)    RDW 21.6 (*)    All other components within normal limits  LIPASE, BLOOD  URINALYSIS, ROUTINE W REFLEX MICROSCOPIC    EKG None  Radiology No results found.  Procedures Procedures    Medications Ordered in ED Medications - No data to display  ED Course/ Medical  Decision Making/ A&P                                 Medical Decision Making Amount and/or Complexity of Data  Reviewed Labs: ordered.  Risk Prescription drug management.   52 yo M well-known to this emergency department with 37 visits in the past 6 months comes in with a chief complaints of abdominal discomfort.  He was actually seen last night and had requested that staff microwave a meal for him which he was reportedly able to eat and then he left.  He has not vomited since he has been here.  I was notified by nursing staff that he was escorted off campus twice earlier today by security.  He returned via EMS.  I have a strong suspicion the patient is here for secondary gain.  His abdomen is soft and nontender.  His LFTs and lipase are unremarkable.  He does have a very mild anion gap elevation.  Could be due to his BUN.  At this point I feel he would not benefit from further medical therapy in the ED setting.  Will discharge home.  PCP follow-up.  3:53 PM:  I have discussed the diagnosis/risks/treatment options with the patient.  Evaluation and diagnostic testing in the emergency department does not suggest an emergent condition requiring admission or immediate intervention beyond what has been performed at this time.  They will follow up with PCP. We also discussed returning to the ED immediately if new or worsening sx occur. We discussed the sx which are most concerning (e.g., sudden worsening pain, fever, inability to tolerate by mouth) that necessitate immediate return. Medications administered to the patient during their visit and any new prescriptions provided to the patient are listed below.  Medications given during this visit Medications - No data to display   The patient appears reasonably screen and/or stabilized for discharge and I doubt any other medical condition or other [facility] requiring further screening, evaluation, or treatment in the ED at this time prior to discharge.           Final Clinical Impression(s) / ED Diagnoses Final diagnoses:  Nausea and vomiting in adult    Rx / DC Orders ED Discharge Orders          Ordered    ondansetron (ZOFRAN-ODT) 4 MG disintegrating tablet  Every 4 hours PRN        03/05/23 1545              Melene Plan, DO 03/05/23 1554

## 2023-03-05 NOTE — Discharge Instructions (Signed)
Follow up with your doctor.  Return for worsening symptoms, inability to eat or drink.

## 2023-03-05 NOTE — ED Notes (Signed)
Patient does not want to be discharged, is requesting a Child psychotherapist, advised he can speak to her out front. Security at bedside, Aundra Millet, charge RN aware.

## 2023-03-05 NOTE — ED Notes (Signed)
Patient sitting on edge of bed. Patient refusing to let this RN take vital signs.

## 2023-03-05 NOTE — ED Triage Notes (Signed)
Pt BIB [facility] from [place] d/t one episode of emesis. Pt reports several months of generalized abd pain with n/v/d & today the nausea was worse & he vomited once before calling EMS. A/Ox4, is a dialysis pt, last Tx yesterday, does report difficulty with transportation to his past Tx. No rebound tenderness on palpation to abd. 140/98, 98 bpm, 99% on RA, CBG 187.

## 2023-03-05 NOTE — ED Notes (Signed)
Patient refused temperature

## 2023-03-06 ENCOUNTER — Other Ambulatory Visit: Payer: Self-pay

## 2023-03-06 ENCOUNTER — Encounter (HOSPITAL_COMMUNITY): Payer: Self-pay

## 2023-03-06 ENCOUNTER — Emergency Department (HOSPITAL_COMMUNITY)
Admission: EM | Admit: 2023-03-06 | Discharge: 2023-03-07 | Disposition: A | Discharge status: Home / Self Care | Payer: 59 | Attending: Emergency Medicine | Admitting: Emergency Medicine

## 2023-03-06 DIAGNOSIS — N186 End stage renal disease: Secondary | ICD-10-CM | POA: Diagnosis not present

## 2023-03-06 DIAGNOSIS — R0902 Hypoxemia: Secondary | ICD-10-CM | POA: Diagnosis not present

## 2023-03-06 DIAGNOSIS — Z5900 Homelessness unspecified: Secondary | ICD-10-CM | POA: Diagnosis not present

## 2023-03-06 DIAGNOSIS — R1084 Generalized abdominal pain: Secondary | ICD-10-CM | POA: Diagnosis not present

## 2023-03-06 DIAGNOSIS — Z794 Long term (current) use of insulin: Secondary | ICD-10-CM | POA: Insufficient documentation

## 2023-03-06 DIAGNOSIS — I120 Hypertensive chronic kidney disease with stage 5 chronic kidney disease or end stage renal disease: Secondary | ICD-10-CM | POA: Diagnosis not present

## 2023-03-06 DIAGNOSIS — Z992 Dependence on renal dialysis: Secondary | ICD-10-CM | POA: Diagnosis not present

## 2023-03-06 DIAGNOSIS — Z79899 Other long term (current) drug therapy: Secondary | ICD-10-CM | POA: Insufficient documentation

## 2023-03-06 DIAGNOSIS — E1122 Type 2 diabetes mellitus with diabetic chronic kidney disease: Secondary | ICD-10-CM | POA: Insufficient documentation

## 2023-03-06 DIAGNOSIS — Z7982 Long term (current) use of aspirin: Secondary | ICD-10-CM | POA: Diagnosis not present

## 2023-03-06 MED ORDER — CALCIUM ACETATE (PHOS BINDER) 667 MG PO TABS
2001.0000 mg | ORAL_TABLET | Freq: Three times a day (TID) | ORAL | 3 refills | Status: DC
Start: 2023-03-06 — End: 2023-05-09
  Filled 2023-03-06 – 2023-04-04 (×2): qty 270, 30d supply, fill #0

## 2023-03-06 MED ORDER — CARVEDILOL 12.5 MG PO TABS: 12.5000 mg | ORAL_TABLET | Freq: Two times a day (BID) | ORAL | 3 refills | Status: DC

## 2023-03-06 NOTE — ED Triage Notes (Signed)
[place] [facility] [place] with no accompanying symptoms. VSS with EMS, no distress noted.

## 2023-03-06 NOTE — ED Notes (Signed)
Called pt no response and not seen anywhere in lobby.

## 2023-03-07 ENCOUNTER — Other Ambulatory Visit: Payer: Self-pay

## 2023-03-07 LAB — CBC
HCT: 38.3 % — ABNORMAL LOW (ref 39.0–52.0)
Hemoglobin: 11.3 g/dL — ABNORMAL LOW (ref 13.0–17.0)
MCH: 25.1 pg — ABNORMAL LOW (ref 26.0–34.0)
MCHC: 29.5 g/dL — ABNORMAL LOW (ref 30.0–36.0)
MCV: 84.9 fL (ref 80.0–100.0)
Platelets: 310 10*3/uL (ref 150–400)
RBC: 4.51 MIL/uL (ref 4.22–5.81)
RDW: 21.6 % — ABNORMAL HIGH (ref 11.5–15.5)
WBC: 7.2 10*3/uL (ref 4.0–10.5)
nRBC: 0 % (ref 0.0–0.2)

## 2023-03-07 LAB — COMPREHENSIVE METABOLIC PANEL
ALT: 13 U/L (ref 0–44)
AST: 23 U/L (ref 15–41)
Albumin: 3.6 g/dL (ref 3.5–5.0)
Alkaline Phosphatase: 204 U/L — ABNORMAL HIGH (ref 38–126)
Anion gap: 15 (ref 5–15)
BUN: 25 mg/dL — ABNORMAL HIGH (ref 6–20)
CO2: 25 mmol/L (ref 22–32)
Calcium: 8.8 mg/dL — ABNORMAL LOW (ref 8.9–10.3)
Chloride: 94 mmol/L — ABNORMAL LOW (ref 98–111)
Creatinine, Ser: 7.05 mg/dL — ABNORMAL HIGH (ref 0.61–1.24)
GFR, Estimated: 9 mL/min — ABNORMAL LOW (ref >60)
Glucose, Bld: 115 mg/dL — ABNORMAL HIGH (ref 70–99)
Potassium: 3.5 mmol/L (ref 3.5–5.1)
Sodium: 134 mmol/L — ABNORMAL LOW (ref 135–145)
Total Bilirubin: 0.7 mg/dL (ref 0.3–1.2)
Total Protein: 9 g/dL — ABNORMAL HIGH (ref 6.5–8.1)

## 2023-03-07 LAB — LIPASE, BLOOD: Lipase: 27 U/L (ref 11–51)

## 2023-03-07 NOTE — ED Provider Notes (Signed)
[place] EMERGENCY DEPARTMENT AT [facility] Provider Note   CSN: [id number] Arrival date & time: 03/06/23  2229     History  Chief Complaint  Patient presents with   Abdominal Pain    [name] is a 52 y.o. male.  52 year old male with history of hypertension, diabetes, hyperlipidemia, and ESRD presents to the emergency department for evaluation of abdominal pain.  [place]  Abdominal pain is generalized.  It is chronic and has been ongoing for couple months.  He reports vomiting earlier today, but denies any other symptoms.  No fever.  States he was last dialyzed yesterday (03/06/2023) for 2 hours because he was late to his session.  The history is provided by the patient. No language interpreter was used.  Abdominal Pain      Home Medications Prior to Admission medications   Medication Sig Start Date End Date Taking? Authorizing Provider  acetaminophen (TYLENOL) 500 MG tablet Take 1 tablet (500 mg total) by mouth every 8 (eight) hours as needed for mild pain. 05/28/22   Leroy Sea, MD  ASPIRIN LOW DOSE 81 MG tablet Take 1 tablet (81 mg total) by mouth daily. 05/28/22   Leroy Sea, MD  calcitRIOL (ROCALTROL) 0.25 MCG capsule Take 1 capsule (0.25 mcg total) by mouth every Tuesday, Thursday, and Saturday at 6 PM. 10/26/22   Ghimire, Werner Lean, MD  calcium acetate (PHOSLO) 667 MG tablet Take 3 tablets (2,001 mg total) by mouth 3 (three) times daily. 12/12/22     calcium acetate (PHOSLO) 667 MG tablet Take 3 tablets (2,001 mg total) by mouth 3 (three) times daily. 03/06/23     carvedilol (COREG) 12.5 MG tablet Take 1 tablet by mouth twice a day with meals 03/06/23     glucose blood test strip Use to check fasting blood sugar once daily. 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 10/26/22   [provider]  hydrALAZINE (APRESOLINE)  25 MG tablet Take 1 tablet (25 mg total) by mouth 3 (three) times daily. 02/15/23   Zannie Cove, MD  insulin aspart (NOVOLOG FLEXPEN) 100 UNIT/ML FlexPen 0-9 Units, Subcutaneous, 3 times daily with meals CBG < 70: Implement Hypoglycemia measures CBG 70 - 120: 0 units CBG 121 - 150: 1 unit CBG 151 - 200: 2 units CBG 201 - 250: 3 units CBG 251 - 300: 5 units CBG 301 - 350: 7 units CBG 351 - 400: 9 units CBG > 400: call MD 10/26/22   Maretta Bees, MD  insulin glargine-yfgn (SEMGLEE) 100 UNIT/ML injection Inject 0.07 mLs (7 Units total) into the skin at bedtime. 10/26/22   Ghimire, Werner Lean, MD  Insulin Pen Needle 32G X 4 MM MISC Use with insulin pens 05/28/22   Leroy Sea, MD  isosorbide mononitrate (IMDUR) 60 MG 24 hr tablet Take 1 tablet (60 mg total) by mouth daily. 10/26/22   Ghimire, Werner Lean, MD  melatonin 3 MG TABS tablet Take 1 tablet (3 mg total) by mouth at bedtime as needed (insomnia). 10/26/22   Ghimire, Werner Lean, MD  mupirocin ointment (BACTROBAN) 2 % Apply 1 Application topically 3 (three) times daily. 01/07/23   Mardella Layman, MD  ondansetron (ZOFRAN-ODT) 4 MG disintegrating tablet Take 1 tablet (4 mg total) by mouth every 4 (four) hours as needed for nausea/vomiting. 03/05/23   Melene Plan, DO  PROTONIX 40 MG tablet Take 1 tablet (40 mg total) by mouth daily. 05/25/22   Leroy Sea, MD  simethicone (MYLICON) 80 MG chewable tablet Chew 1 tablet (80 mg total) by mouth 4 (four) times daily for 5 days. 11/22/22 12/17/22  Rodolph Bong, MD  gabapentin (NEURONTIN) 300 MG capsule Take 1 capsule (300 mg total) by mouth 3 (three) times daily. Patient not taking: Reported on 07/27/2019 07/29/18 08/30/19  Kallie Locks, FNP  sildenafil (VIAGRA) 25 MG tablet Take 1 tablet (25 mg total) by mouth as needed for erectile dysfunction. for erectile dysfunction. Patient not taking: Reported on 08/11/2020 10/13/19 11/27/20  Kallie Locks, FNP      Allergies    Sulfa antibiotics, Other,  Bactrim [sulfamethoxazole-trimethoprim], and Cephalexin    Review of Systems   Review of Systems  Gastrointestinal:  Positive for abdominal pain.  Ten systems reviewed and are negative for acute change, except as noted in the HPI.    Physical Exam Updated Vital Signs BP (!) 159/90 (BP Location: Left Arm)   Pulse 82   Temp 98.7 F (37.1 C) (Oral)   Resp 20   Ht 5\' 7"  (1.702 m)   Wt 81.6 kg   SpO2 94%   BMI 28.19 kg/m   Physical Exam Vitals and nursing note reviewed.  Constitutional:      General: He is not in acute distress.    Appearance: He is well-developed. He is not diaphoretic.     Comments: Disheveled.  Wearing old hospital scrubs.  Foul-smelling.  HENT:     Head: Normocephalic and atraumatic.  Eyes:     General: No scleral icterus.    Conjunctiva/sclera: Conjunctivae normal.  Pulmonary:     Effort: Pulmonary effort is normal. No respiratory distress.  Abdominal:     Palpations: Abdomen is soft.     Tenderness: There is no abdominal tenderness. There is no guarding.     Comments: Abdomen soft, distended.  There is no tenderness to palpation on exam.  No palpable masses.  Musculoskeletal:        General: Normal range of motion.     Cervical back: Normal range of motion.  Skin:    General: Skin is warm and dry.     Coloration: Skin is not pale.     Findings: No erythema or rash.  Neurological:     Mental Status: He is alert and oriented to person, place, and time.     Coordination: Coordination normal.  Psychiatric:        Behavior: Behavior normal.     ED Results / Procedures / Treatments   Labs (all labs ordered are listed, but only abnormal results are displayed) Labs Reviewed  COMPREHENSIVE METABOLIC PANEL - Abnormal; Notable for the following components:      Result Value   Sodium 134 (*)    Chloride 94 (*)    Glucose, Bld 115 (*)    BUN 25 (*)    Creatinine, Ser 7.05 (*)    Calcium 8.8 (*)    Total Protein 9.0 (*)    Alkaline Phosphatase 204  (*)    GFR, Estimated 9 (*)    All other components within normal limits  CBC - Abnormal; Notable for the following components:   Hemoglobin 11.3 (*)    HCT 38.3 (*)    MCH 25.1 (*)    MCHC 29.5 (*)    RDW 21.6 (*)    All other components within normal limits  LIPASE, BLOOD    EKG None  Radiology No results found.  Procedures Procedures    Medications Ordered in ED Medications - No data to display  ED Course/ Medical Decision Making/ A&P Clinical Course as of 03/07/23 0625  Fri Mar 07, 2023  0550 Labs are reviewed and are consistent with baseline.  He has noted to have a chronic anemia of 11.3 which is stable.  No significant electrolyte derangements.  Creatinine consistent with history of ESRD on dialysis.  No hyper-K. [KH]    Clinical Course User Index [KH] Antony Madura, PA-C                                 Medical Decision Making  This patient presents to the ED for concern of abdominal pain, this involves an extensive number of treatment options, and is a complaint that carries with it a high risk of complications and morbidity.  The differential diagnosis includes pSBO/SBO vs peritonitis vs ileus vs viral illness vs ruptured viscous   Co morbidities that complicate the patient evaluation  HTN DM ESRD   Additional history obtained:  External records from outside source obtained and reviewed including CBC, CMP from 03/05/23   Lab Tests:  I Ordered, and personally interpreted labs.  The pertinent results include:  stable CBC, CMP compared to prior. No leukocytosis.   Cardiac Monitoring:  The patient was maintained on a cardiac monitor.  I personally viewed and interpreted the cardiac monitored which showed an underlying rhythm of: NSR   Medicines ordered and prescription drug management:  I have reviewed the patients home medicines and have made adjustments as needed   Test Considered:  CT abdomen/pelvis - felt low yield given reassuring labs,  symptom chronicity.    Problem List / ED Course:  52 year old male presenting to the emergency department for abdominal pain.  Has been seen multiple times in the past for similar complaints.  Reports vomiting earlier today, but has been in the emergency department for over 7 hours without any repeat emesis.  He has a soft, nontender abdomen.  No focal tenderness noted.  Also no fever, leukocytosis.  Low suspicion for infectious etiology.  Labs are in keeping with baseline; no acute change.  Given chronicity of this complaint with stable evaluation, I do not believe further emergent workup is presently indicated.  He is stable for discharge to follow-up with his primary doctor.   Reevaluation:  After the interventions noted above, I reevaluated the patient and found that they have :stayed the same   Social Determinants of Health:  Homeless    Dispostion:  After consideration of the diagnostic results and the patients response to treatment, I feel that the patent would benefit from PCP f/u for recheck of this ongoing, chronic issue.          Final Clinical Impression(s) / ED Diagnoses Final diagnoses:  Generalized abdominal pain    Rx / DC Orders ED Discharge Orders     None         Antony Madura, PA-C 03/07/23 1610    Gilda Crease, MD 03/07/23 [id number]

## 2023-03-07 NOTE — Discharge Instructions (Signed)
Your evaluation in the emergency department was reassuring.  We recommend ongoing evaluation by your primary doctor.  You may return for new or concerning symptoms.

## 2023-03-08 ENCOUNTER — Emergency Department (HOSPITAL_COMMUNITY)
Admission: EM | Admit: 2023-03-08 | Discharge: 2023-03-09 | Disposition: A | Discharge status: Home / Self Care | Payer: 59 | Attending: Emergency Medicine | Admitting: Emergency Medicine

## 2023-03-08 ENCOUNTER — Other Ambulatory Visit: Payer: Self-pay

## 2023-03-08 DIAGNOSIS — R1084 Generalized abdominal pain: Secondary | ICD-10-CM | POA: Diagnosis not present

## 2023-03-08 DIAGNOSIS — R109 Unspecified abdominal pain: Secondary | ICD-10-CM | POA: Insufficient documentation

## 2023-03-08 DIAGNOSIS — I517 Cardiomegaly: Secondary | ICD-10-CM | POA: Diagnosis not present

## 2023-03-08 DIAGNOSIS — R140 Abdominal distension (gaseous): Secondary | ICD-10-CM | POA: Diagnosis not present

## 2023-03-08 DIAGNOSIS — G8929 Other chronic pain: Secondary | ICD-10-CM | POA: Diagnosis not present

## 2023-03-08 DIAGNOSIS — R0989 Other specified symptoms and signs involving the circulatory and respiratory systems: Secondary | ICD-10-CM | POA: Diagnosis not present

## 2023-03-08 DIAGNOSIS — Z7982 Long term (current) use of aspirin: Secondary | ICD-10-CM | POA: Diagnosis not present

## 2023-03-08 DIAGNOSIS — Z992 Dependence on renal dialysis: Secondary | ICD-10-CM | POA: Diagnosis not present

## 2023-03-08 DIAGNOSIS — Z794 Long term (current) use of insulin: Secondary | ICD-10-CM | POA: Diagnosis not present

## 2023-03-08 DIAGNOSIS — R110 Nausea: Secondary | ICD-10-CM | POA: Diagnosis not present

## 2023-03-08 DIAGNOSIS — R600 Localized edema: Secondary | ICD-10-CM | POA: Diagnosis not present

## 2023-03-08 DIAGNOSIS — Z79899 Other long term (current) drug therapy: Secondary | ICD-10-CM | POA: Diagnosis not present

## 2023-03-08 DIAGNOSIS — J189 Pneumonia, unspecified organism: Secondary | ICD-10-CM

## 2023-03-08 DIAGNOSIS — N186 End stage renal disease: Secondary | ICD-10-CM | POA: Diagnosis not present

## 2023-03-08 DIAGNOSIS — E8770 Fluid overload, unspecified: Secondary | ICD-10-CM | POA: Diagnosis not present

## 2023-03-08 DIAGNOSIS — R918 Other nonspecific abnormal finding of lung field: Secondary | ICD-10-CM | POA: Diagnosis not present

## 2023-03-08 NOTE — ED Notes (Signed)
Pt refused blood pressure d/t not wanting to remove fluffy coat.

## 2023-03-08 NOTE — ED Triage Notes (Signed)
Patient reports generalized abdominal pain  and mild headache .

## 2023-03-09 ENCOUNTER — Emergency Department (HOSPITAL_COMMUNITY): Payer: 59

## 2023-03-09 ENCOUNTER — Emergency Department (HOSPITAL_COMMUNITY)
Admission: EM | Admit: 2023-03-09 | Discharge: 2023-03-09 | Disposition: A | Discharge status: Home / Self Care | Payer: 59 | Source: Home / Self Care | Attending: Emergency Medicine | Admitting: Emergency Medicine

## 2023-03-09 ENCOUNTER — Other Ambulatory Visit: Payer: Self-pay

## 2023-03-09 ENCOUNTER — Encounter (HOSPITAL_COMMUNITY): Payer: Self-pay | Admitting: *Deleted

## 2023-03-09 ENCOUNTER — Encounter (HOSPITAL_COMMUNITY): Payer: Self-pay

## 2023-03-09 DIAGNOSIS — N186 End stage renal disease: Secondary | ICD-10-CM | POA: Insufficient documentation

## 2023-03-09 DIAGNOSIS — Z992 Dependence on renal dialysis: Secondary | ICD-10-CM | POA: Insufficient documentation

## 2023-03-09 DIAGNOSIS — R918 Other nonspecific abnormal finding of lung field: Secondary | ICD-10-CM | POA: Diagnosis not present

## 2023-03-09 DIAGNOSIS — G8929 Other chronic pain: Secondary | ICD-10-CM | POA: Insufficient documentation

## 2023-03-09 DIAGNOSIS — R1084 Generalized abdominal pain: Secondary | ICD-10-CM | POA: Insufficient documentation

## 2023-03-09 DIAGNOSIS — R140 Abdominal distension (gaseous): Secondary | ICD-10-CM | POA: Insufficient documentation

## 2023-03-09 DIAGNOSIS — R600 Localized edema: Secondary | ICD-10-CM | POA: Insufficient documentation

## 2023-03-09 DIAGNOSIS — R0989 Other specified symptoms and signs involving the circulatory and respiratory systems: Secondary | ICD-10-CM | POA: Diagnosis not present

## 2023-03-09 DIAGNOSIS — R109 Unspecified abdominal pain: Secondary | ICD-10-CM | POA: Diagnosis not present

## 2023-03-09 DIAGNOSIS — R110 Nausea: Secondary | ICD-10-CM | POA: Insufficient documentation

## 2023-03-09 DIAGNOSIS — I517 Cardiomegaly: Secondary | ICD-10-CM | POA: Diagnosis not present

## 2023-03-09 LAB — CBC
HCT: 33.5 % — ABNORMAL LOW (ref 39.0–52.0)
Hemoglobin: 10.1 g/dL — ABNORMAL LOW (ref 13.0–17.0)
MCH: 25.2 pg — ABNORMAL LOW (ref 26.0–34.0)
MCHC: 30.1 g/dL (ref 30.0–36.0)
MCV: 83.5 fL (ref 80.0–100.0)
Platelets: 320 10*3/uL (ref 150–400)
RBC: 4.01 MIL/uL — ABNORMAL LOW (ref 4.22–5.81)
RDW: 21.4 % — ABNORMAL HIGH (ref 11.5–15.5)
WBC: 8.9 10*3/uL (ref 4.0–10.5)
nRBC: 0 % (ref 0.0–0.2)

## 2023-03-09 LAB — BASIC METABOLIC PANEL
Anion gap: 14 (ref 5–15)
BUN: 51 mg/dL — ABNORMAL HIGH (ref 6–20)
CO2: 23 mmol/L (ref 22–32)
Calcium: 7.5 mg/dL — ABNORMAL LOW (ref 8.9–10.3)
Chloride: 98 mmol/L (ref 98–111)
Creatinine, Ser: 9.6 mg/dL — ABNORMAL HIGH (ref 0.61–1.24)
GFR, Estimated: 6 mL/min — ABNORMAL LOW (ref >60)
Glucose, Bld: 231 mg/dL — ABNORMAL HIGH (ref 70–99)
Potassium: 3.5 mmol/L (ref 3.5–5.1)
Sodium: 135 mmol/L (ref 135–145)

## 2023-03-09 LAB — COMPREHENSIVE METABOLIC PANEL
ALT: 13 U/L (ref 0–44)
AST: 21 U/L (ref 15–41)
Albumin: 2.8 g/dL — ABNORMAL LOW (ref 3.5–5.0)
Alkaline Phosphatase: 176 U/L — ABNORMAL HIGH (ref 38–126)
Anion gap: 17 — ABNORMAL HIGH (ref 5–15)
BUN: 43 mg/dL — ABNORMAL HIGH (ref 6–20)
CO2: 22 mmol/L (ref 22–32)
Calcium: 7.9 mg/dL — ABNORMAL LOW (ref 8.9–10.3)
Chloride: 100 mmol/L (ref 98–111)
Creatinine, Ser: 9.43 mg/dL — ABNORMAL HIGH (ref 0.61–1.24)
GFR, Estimated: 6 mL/min — ABNORMAL LOW (ref >60)
Glucose, Bld: 131 mg/dL — ABNORMAL HIGH (ref 70–99)
Potassium: 3.9 mmol/L (ref 3.5–5.1)
Sodium: 139 mmol/L (ref 135–145)
Total Bilirubin: 0.7 mg/dL (ref 0.3–1.2)
Total Protein: 7.1 g/dL (ref 6.5–8.1)

## 2023-03-09 LAB — CBC WITH DIFFERENTIAL/PLATELET
Abs Immature Granulocytes: 0 10*3/uL (ref 0.00–0.07)
Basophils Absolute: 0.2 10*3/uL — ABNORMAL HIGH (ref 0.0–0.1)
Basophils Relative: 2 %
Eosinophils Absolute: 0.1 10*3/uL (ref 0.0–0.5)
Eosinophils Relative: 1 %
HCT: 34.7 % — ABNORMAL LOW (ref 39.0–52.0)
Hemoglobin: 10.1 g/dL — ABNORMAL LOW (ref 13.0–17.0)
Lymphocytes Relative: 11 %
Lymphs Abs: 0.9 10*3/uL (ref 0.7–4.0)
MCH: 24.4 pg — ABNORMAL LOW (ref 26.0–34.0)
MCHC: 29.1 g/dL — ABNORMAL LOW (ref 30.0–36.0)
MCV: 83.8 fL (ref 80.0–100.0)
Monocytes Absolute: 0.6 10*3/uL (ref 0.1–1.0)
Monocytes Relative: 7 %
Neutro Abs: 6.7 10*3/uL (ref 1.7–7.7)
Neutrophils Relative %: 79 %
Platelets: 300 10*3/uL (ref 150–400)
RBC: 4.14 MIL/uL — ABNORMAL LOW (ref 4.22–5.81)
RDW: 21.2 % — ABNORMAL HIGH (ref 11.5–15.5)
WBC: 8.5 10*3/uL (ref 4.0–10.5)
nRBC: 0 % (ref 0.0–0.2)
nRBC: 0 /100{WBCs}

## 2023-03-09 MED ORDER — DOXYCYCLINE HYCLATE 100 MG PO CAPS
100.0000 mg | ORAL_CAPSULE | Freq: Two times a day (BID) | ORAL | 0 refills | Status: DC
  Filled 2023-03-09: qty 14, 7d supply, fill #0

## 2023-03-09 MED ORDER — DOXYCYCLINE HYCLATE 100 MG PO TABS
100.0000 mg | ORAL_TABLET | Freq: Once | ORAL | Status: AC
  Administered 2023-03-09: 100 mg via ORAL
  Filled 2023-03-09: qty 1

## 2023-03-09 NOTE — ED Triage Notes (Signed)
Hx Dialysis with last treatment on Thursday. Patient missed treatment on Saturday.  Pt reports abd pain and abd tightness with n/v/d after eating.  Also c/o bumps to back of head.

## 2023-03-09 NOTE — Discharge Instructions (Signed)
You need to go to dialysis on Tuesday Your testing is unremarkable and reassuring ER for worsening symptoms

## 2023-03-09 NOTE — ED Provider Notes (Signed)
[place] EMERGENCY DEPARTMENT AT [facility] Provider Note   CSN: [id number] Arrival date & time: 03/09/23  2148     History  Chief Complaint  Patient presents with   Abdominal Pain    [name] is a 52 y.o. male.   Abdominal Pain  This patient is a 52 y/o male - who has chronic medical conditions including end-stage renal disease on dialysis Tuesdays Thursdays and Saturdays.  The patient reports that he missed dialysis on Saturday, yesterday because he did not have a ride, he is actually been at the hospital most days this month, in reverse he was here yesterday, the 17th, the 16th twice, the 15th, the 13th, the 12th, the sixth, the third.  Most of these are for generalized abdominal pain, nausea, this is not infrequent and is a very good generalization of most of his visits to the hospital.  The patient has frequent visits asking if he can be placed in a nursing home because he is homeless, he also tells me tonight that he would like to be placed in a nursing home because he is homeless.    Home Medications Prior to Admission medications   Medication Sig Start Date End Date Taking? Authorizing Provider  acetaminophen (TYLENOL) 500 MG tablet Take 1 tablet (500 mg total) by mouth every 8 (eight) hours as needed for mild pain. 05/28/22   Leroy Sea, MD  ASPIRIN LOW DOSE 81 MG tablet Take 1 tablet (81 mg total) by mouth daily. 05/28/22   Leroy Sea, MD  calcitRIOL (ROCALTROL) 0.25 MCG capsule Take 1 capsule (0.25 mcg total) by mouth every Tuesday, Thursday, and Saturday at 6 PM. 10/26/22   Ghimire, Werner Lean, MD  calcium acetate (PHOSLO) 667 MG tablet Take 3 tablets (2,001 mg total) by mouth 3 (three) times daily. 12/12/22     calcium acetate (PHOSLO) 667 MG tablet Take 3 tablets (2,001 mg total) by mouth 3 (three) times daily. 03/06/23     carvedilol (COREG) 12.5 MG tablet Take 1 tablet by mouth twice a day with meals 03/06/23     doxycycline (VIBRAMYCIN) 100  MG capsule Take 1 capsule (100 mg total) by mouth 2 (two) times daily. One po bid x 7 days 03/09/23   Mesner, Barbara Cower, MD  glucose blood test strip Use to check fasting blood sugar once daily. 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 10/26/22   [provider]  hydrALAZINE (APRESOLINE) 25 MG tablet Take 1 tablet (25 mg total) by mouth 3 (three) times daily. 02/15/23   Zannie Cove, MD  insulin aspart (NOVOLOG FLEXPEN) 100 UNIT/ML FlexPen 0-9 Units, Subcutaneous, 3 times daily with meals CBG < 70: Implement Hypoglycemia measures CBG 70 - 120: 0 units CBG 121 - 150: 1 unit CBG 151 - 200: 2 units CBG 201 - 250: 3 units CBG 251 - 300: 5 units CBG 301 - 350: 7 units CBG 351 - 400: 9 units CBG > 400: call MD 10/26/22   Maretta Bees, MD  insulin glargine-yfgn (SEMGLEE) 100 UNIT/ML injection Inject 0.07 mLs (7 Units total) into the skin at bedtime. 10/26/22   Ghimire, Werner Lean, MD  Insulin Pen Needle 32G X 4 MM MISC Use with insulin pens 05/28/22   Leroy Sea, MD  isosorbide mononitrate (IMDUR) 60 MG 24 hr tablet Take 1 tablet (60 mg total) by mouth daily. 10/26/22  Ghimire, Werner Lean, MD  melatonin 3 MG TABS tablet Take 1 tablet (3 mg total) by mouth at bedtime as needed (insomnia). 10/26/22   Ghimire, Werner Lean, MD  mupirocin ointment (BACTROBAN) 2 % Apply 1 Application topically 3 (three) times daily. 01/07/23   Mardella Layman, MD  ondansetron (ZOFRAN-ODT) 4 MG disintegrating tablet Take 1 tablet (4 mg total) by mouth every 4 (four) hours as needed for nausea/vomiting. 03/05/23   Melene Plan, DO  PROTONIX 40 MG tablet Take 1 tablet (40 mg total) by mouth daily. 05/25/22   Leroy Sea, MD  simethicone (MYLICON) 80 MG chewable tablet Chew 1 tablet (80 mg total) by mouth 4 (four) times daily for 5 days. 11/22/22 12/17/22  Rodolph Bong, MD  gabapentin (NEURONTIN) 300 MG capsule Take 1 capsule (300 mg total) by  mouth 3 (three) times daily. Patient not taking: Reported on 07/27/2019 07/29/18 08/30/19  Kallie Locks, FNP  sildenafil (VIAGRA) 25 MG tablet Take 1 tablet (25 mg total) by mouth as needed for erectile dysfunction. for erectile dysfunction. Patient not taking: Reported on 08/11/2020 10/13/19 11/27/20  Kallie Locks, FNP      Allergies    Sulfa antibiotics, Other, Bactrim [sulfamethoxazole-trimethoprim], and Cephalexin    Review of Systems   Review of Systems  Gastrointestinal:  Positive for abdominal pain.    Physical Exam Updated Vital Signs BP (!) 177/90   Pulse 86   Temp 98 F (36.7 C)   Resp 18   Wt 81 kg   SpO2 98%   BMI 27.97 kg/m  Physical Exam Vitals and nursing note reviewed.  Constitutional:      General: He is not in acute distress.    Appearance: He is well-developed.  HENT:     Head: Normocephalic and atraumatic.     Mouth/Throat:     Pharynx: No oropharyngeal exudate.  Eyes:     General: No scleral icterus.       Right eye: No discharge.        Left eye: No discharge.     Conjunctiva/sclera: Conjunctivae normal.     Pupils: Pupils are equal, round, and reactive to light.  Neck:     Thyroid: No thyromegaly.     Vascular: No JVD.  Cardiovascular:     Rate and Rhythm: Normal rate and regular rhythm.     Heart sounds: Normal heart sounds. No murmur heard.    No friction rub. No gallop.  Pulmonary:     Effort: Pulmonary effort is normal. No respiratory distress.     Breath sounds: Normal breath sounds. No wheezing or rales.  Abdominal:     General: Bowel sounds are normal. There is distension.     Palpations: Abdomen is soft. There is no mass.     Tenderness: There is no abdominal tenderness.     Comments: This patient is a very rotund abdomen, there is no tenderness, he has normal bowel sounds, slight fluid wave  Musculoskeletal:        General: No tenderness. Normal range of motion.     Cervical back: Normal range of motion and neck supple.      Right lower leg: Edema present.     Left lower leg: Edema present.  Lymphadenopathy:     Cervical: No cervical adenopathy.  Skin:    General: Skin is warm and dry.     Findings: No erythema or rash.  Neurological:     Mental Status: He is alert.  Coordination: Coordination normal.  Psychiatric:        Behavior: Behavior normal.     ED Results / Procedures / Treatments   Labs (all labs ordered are listed, but only abnormal results are displayed) Labs Reviewed  BASIC METABOLIC PANEL - Abnormal; Notable for the following components:      Result Value   Glucose, Bld 231 (*)    BUN 51 (*)    Creatinine, Ser 9.60 (*)    Calcium 7.5 (*)    GFR, Estimated 6 (*)    All other components within normal limits  CBC    EKG None  Radiology DG Chest Portable 1 View  Result Date: 03/09/2023 CLINICAL DATA:  52 year old male under evaluation for fluid. EXAM: PORTABLE CHEST 1 VIEW COMPARISON:  Chest x-ray 02/23/2023. FINDINGS: Patchy areas of interstitial prominence, peribronchial cuffing and ill-defined nodular densities are noted in the lungs bilaterally, most evident in the left mid lung and right lung base. No pleural effusions. Cephalization of the pulmonary vasculature, without frank pulmonary edema. Cardiopericardial silhouette is moderately enlarged, similar to the prior study. Upper mediastinal contours are within normal limits. Old healed fracture of the distal left clavicle with probable pseudoarthrosis. IMPRESSION: 1. The appearance of the chest suggests bronchitis with developing multilobar bilateral bronchopneumonia, most evident in the mid to lower lungs, as above. 2. Cardiomegaly with pulmonary venous congestion, but no frank pulmonary edema. Electronically Signed   By: Trudie Reed M.D.   On: 03/09/2023 07:02    Procedures Procedures    Medications Ordered in ED Medications - No data to display  ED Course/ Medical Decision Making/ A&P                                  Medical Decision Making Amount and/or Complexity of Data Reviewed Labs: ordered.   This patient once again has missed his dialysis yesterday, he has been seen in the emergency department within the last 24 hours during which time he comes back stating that he still feels like his abdomen is in pain.  Interestingly the patient is continually asking me if he can be placed into a nursing home so that he can have a place to live.  There is some significant secondary gain, we will check a metabolic panel to make sure he is not hyperkalemic otherwise he is not hypoxic tachypneic or tachycardic, he is mildly hypertensive but nothing of any significant concern given his underlying dialysis.  Metabolic panel shows potassium 3.5, glucose of 230 No hypoxia, no tachycardia Patient advised to go to dialysis on Tuesday  I suspect significant secondary gain to the patient's visit        Final Clinical Impression(s) / ED Diagnoses Final diagnoses:  Chronic abdominal pain    Rx / DC Orders ED Discharge Orders     None         Eber Hong, MD 03/09/23 2306

## 2023-03-09 NOTE — ED Notes (Signed)
Security was called to help discharge the Pt from [facility]. Pts visitor began cussing at ED Staff, as well as the Pt. Pt attempting to steal hospital blankets and take them home as well. Pt uncooperative at this time.
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
## 2023-03-10 ENCOUNTER — Emergency Department (HOSPITAL_COMMUNITY)
Admission: EM | Admit: 2023-03-10 | Discharge: 2023-03-11 | Disposition: A | Discharge status: Home / Self Care | Payer: 59 | Attending: Emergency Medicine | Admitting: Emergency Medicine

## 2023-03-10 ENCOUNTER — Other Ambulatory Visit: Payer: Self-pay

## 2023-03-10 DIAGNOSIS — R109 Unspecified abdominal pain: Secondary | ICD-10-CM | POA: Diagnosis not present

## 2023-03-10 DIAGNOSIS — I120 Hypertensive chronic kidney disease with stage 5 chronic kidney disease or end stage renal disease: Secondary | ICD-10-CM | POA: Diagnosis not present

## 2023-03-10 DIAGNOSIS — Z992 Dependence on renal dialysis: Secondary | ICD-10-CM | POA: Diagnosis not present

## 2023-03-10 DIAGNOSIS — R110 Nausea: Secondary | ICD-10-CM | POA: Diagnosis not present

## 2023-03-10 DIAGNOSIS — N186 End stage renal disease: Secondary | ICD-10-CM | POA: Insufficient documentation

## 2023-03-10 DIAGNOSIS — Z5900 Homelessness unspecified: Secondary | ICD-10-CM | POA: Diagnosis not present

## 2023-03-10 DIAGNOSIS — Z79899 Other long term (current) drug therapy: Secondary | ICD-10-CM | POA: Insufficient documentation

## 2023-03-10 DIAGNOSIS — Z794 Long term (current) use of insulin: Secondary | ICD-10-CM | POA: Diagnosis not present

## 2023-03-10 DIAGNOSIS — R1084 Generalized abdominal pain: Secondary | ICD-10-CM | POA: Insufficient documentation

## 2023-03-10 DIAGNOSIS — R600 Localized edema: Secondary | ICD-10-CM | POA: Insufficient documentation

## 2023-03-10 DIAGNOSIS — Z7982 Long term (current) use of aspirin: Secondary | ICD-10-CM | POA: Diagnosis not present

## 2023-03-10 NOTE — Plan of Care (Signed)
[facility] Tonsillectomy/Adenoidectomy, Postoperative PEDS care plan entered in error.

## 2023-03-11 ENCOUNTER — Encounter (HOSPITAL_COMMUNITY): Payer: Self-pay | Admitting: Pharmacy Technician

## 2023-03-11 ENCOUNTER — Other Ambulatory Visit: Payer: Self-pay

## 2023-03-11 ENCOUNTER — Encounter (HOSPITAL_COMMUNITY): Payer: Self-pay

## 2023-03-11 ENCOUNTER — Emergency Department (HOSPITAL_COMMUNITY)
Admission: EM | Admit: 2023-03-11 | Discharge: 2023-03-11 | Disposition: A | Discharge status: Home / Self Care | Payer: 59 | Source: Home / Self Care | Attending: Emergency Medicine | Admitting: Emergency Medicine

## 2023-03-11 DIAGNOSIS — E876 Hypokalemia: Secondary | ICD-10-CM | POA: Insufficient documentation

## 2023-03-11 DIAGNOSIS — R000 Tachycardia, unspecified: Secondary | ICD-10-CM | POA: Diagnosis not present

## 2023-03-11 DIAGNOSIS — Z992 Dependence on renal dialysis: Secondary | ICD-10-CM | POA: Insufficient documentation

## 2023-03-11 DIAGNOSIS — N186 End stage renal disease: Secondary | ICD-10-CM | POA: Insufficient documentation

## 2023-03-11 DIAGNOSIS — I10 Essential (primary) hypertension: Secondary | ICD-10-CM | POA: Diagnosis not present

## 2023-03-11 DIAGNOSIS — R531 Weakness: Secondary | ICD-10-CM | POA: Diagnosis not present

## 2023-03-11 DIAGNOSIS — I120 Hypertensive chronic kidney disease with stage 5 chronic kidney disease or end stage renal disease: Secondary | ICD-10-CM | POA: Diagnosis not present

## 2023-03-11 DIAGNOSIS — R140 Abdominal distension (gaseous): Secondary | ICD-10-CM | POA: Insufficient documentation

## 2023-03-11 DIAGNOSIS — R112 Nausea with vomiting, unspecified: Secondary | ICD-10-CM | POA: Diagnosis not present

## 2023-03-11 DIAGNOSIS — R001 Bradycardia, unspecified: Secondary | ICD-10-CM | POA: Diagnosis not present

## 2023-03-11 LAB — CBC WITH DIFFERENTIAL/PLATELET
Abs Immature Granulocytes: 0.04 10*3/uL (ref 0.00–0.07)
Basophils Absolute: 0 10*3/uL (ref 0.0–0.1)
Basophils Relative: 0 %
Eosinophils Absolute: 0.5 10*3/uL (ref 0.0–0.5)
Eosinophils Relative: 6 %
HCT: 33.8 % — ABNORMAL LOW (ref 39.0–52.0)
Hemoglobin: 9.9 g/dL — ABNORMAL LOW (ref 13.0–17.0)
Immature Granulocytes: 1 %
Lymphocytes Relative: 12 %
Lymphs Abs: 0.9 10*3/uL (ref 0.7–4.0)
MCH: 24.8 pg — ABNORMAL LOW (ref 26.0–34.0)
MCHC: 29.3 g/dL — ABNORMAL LOW (ref 30.0–36.0)
MCV: 84.7 fL (ref 80.0–100.0)
Monocytes Absolute: 1.2 10*3/uL — ABNORMAL HIGH (ref 0.1–1.0)
Monocytes Relative: 15 %
Neutro Abs: 5.2 10*3/uL (ref 1.7–7.7)
Neutrophils Relative %: 66 %
Platelets: 312 10*3/uL (ref 150–400)
RBC: 3.99 MIL/uL — ABNORMAL LOW (ref 4.22–5.81)
RDW: 21.2 % — ABNORMAL HIGH (ref 11.5–15.5)
Smear Review: ADEQUATE
WBC: 7.9 10*3/uL (ref 4.0–10.5)
nRBC: 0 % (ref 0.0–0.2)

## 2023-03-11 LAB — CBC
HCT: 33.8 % — ABNORMAL LOW (ref 39.0–52.0)
Hemoglobin: 10.3 g/dL — ABNORMAL LOW (ref 13.0–17.0)
MCH: 25.1 pg — ABNORMAL LOW (ref 26.0–34.0)
MCHC: 30.5 g/dL (ref 30.0–36.0)
MCV: 82.2 fL (ref 80.0–100.0)
Platelets: 324 10*3/uL (ref 150–400)
RBC: 4.11 MIL/uL — ABNORMAL LOW (ref 4.22–5.81)
RDW: 21.1 % — ABNORMAL HIGH (ref 11.5–15.5)
WBC: 9.2 10*3/uL (ref 4.0–10.5)
nRBC: 0 % (ref 0.0–0.2)

## 2023-03-11 LAB — COMPREHENSIVE METABOLIC PANEL
ALT: 12 U/L (ref 0–44)
ALT: 13 U/L (ref 0–44)
AST: 18 U/L (ref 15–41)
AST: 22 U/L (ref 15–41)
Albumin: 3 g/dL — ABNORMAL LOW (ref 3.5–5.0)
Albumin: 2.8 g/dL — ABNORMAL LOW (ref 3.5–5.0)
Alkaline Phosphatase: 180 U/L — ABNORMAL HIGH (ref 38–126)
Alkaline Phosphatase: 166 U/L — ABNORMAL HIGH (ref 38–126)
Anion gap: 19 — ABNORMAL HIGH (ref 5–15)
Anion gap: 14 (ref 5–15)
BUN: 59 mg/dL — ABNORMAL HIGH (ref 6–20)
BUN: 59 mg/dL — ABNORMAL HIGH (ref 6–20)
CO2: 22 mmol/L (ref 22–32)
CO2: 20 mmol/L — ABNORMAL LOW (ref 22–32)
Calcium: 8 mg/dL — ABNORMAL LOW (ref 8.9–10.3)
Calcium: 7 mg/dL — ABNORMAL LOW (ref 8.9–10.3)
Chloride: 100 mmol/L (ref 98–111)
Chloride: 101 mmol/L (ref 98–111)
Creatinine, Ser: 10.98 mg/dL — ABNORMAL HIGH (ref 0.61–1.24)
Creatinine, Ser: 10.94 mg/dL — ABNORMAL HIGH (ref 0.61–1.24)
GFR, Estimated: 5 mL/min — ABNORMAL LOW (ref >60)
GFR, Estimated: 5 mL/min — ABNORMAL LOW (ref >60)
Glucose, Bld: 133 mg/dL — ABNORMAL HIGH (ref 70–99)
Glucose, Bld: 136 mg/dL — ABNORMAL HIGH (ref 70–99)
Potassium: 3.8 mmol/L (ref 3.5–5.1)
Potassium: 4.1 mmol/L (ref 3.5–5.1)
Sodium: 141 mmol/L (ref 135–145)
Sodium: 135 mmol/L (ref 135–145)
Total Bilirubin: 0.5 mg/dL (ref 0.3–1.2)
Total Bilirubin: 0.5 mg/dL (ref 0.3–1.2)
Total Protein: 7.5 g/dL (ref 6.5–8.1)
Total Protein: 7 g/dL (ref 6.5–8.1)

## 2023-03-11 LAB — LIPASE, BLOOD: Lipase: 31 U/L (ref 11–51)

## 2023-03-11 NOTE — ED Provider Notes (Signed)
[place] EMERGENCY DEPARTMENT AT [facility] Provider Note   CSN: [id number] Arrival date & time: 03/10/23  2303     History  Chief Complaint  Patient presents with   Abdominal Pain    [name] is a 52 y.o. male.   Abdominal Pain  This patient is a 52 y/o male - who has chronic medical conditions including end-stage renal disease on dialysis Tuesdays Thursdays and Saturdays.  The patient reports that he missed dialysis on Saturday due to lack of transportation.  He is frequently in the emergency department and was here the 20th, the 19th, the 17th, the 16th twice, the 15th, the 13th, the 12th, the sixth, the third.  Most of these are for generalized abdominal pain, nausea.  This is typical for the majority of his visits.  He frequently misses dialysis.  Patient is also homeless which complicates his situation there is    Home Medications Prior to Admission medications   Medication Sig Start Date End Date Taking? Authorizing Provider  acetaminophen (TYLENOL) 500 MG tablet Take 1 tablet (500 mg total) by mouth every 8 (eight) hours as needed for mild pain. 05/28/22   Leroy Sea, MD  ASPIRIN LOW DOSE 81 MG tablet Take 1 tablet (81 mg total) by mouth daily. 05/28/22   Leroy Sea, MD  calcitRIOL (ROCALTROL) 0.25 MCG capsule Take 1 capsule (0.25 mcg total) by mouth every Tuesday, Thursday, and Saturday at 6 PM. 10/26/22   Ghimire, Werner Lean, MD  calcium acetate (PHOSLO) 667 MG tablet Take 3 tablets (2,001 mg total) by mouth 3 (three) times daily. 12/12/22     calcium acetate (PHOSLO) 667 MG tablet Take 3 tablets (2,001 mg total) by mouth 3 (three) times daily. 03/06/23     carvedilol (COREG) 12.5 MG tablet Take 1 tablet by mouth twice a day with meals 03/06/23     doxycycline (VIBRAMYCIN) 100 MG capsule Take 1 capsule (100 mg total) by mouth 2 (two) times daily. 03/09/23   Mesner, Barbara Cower, MD  glucose blood test strip Use to check fasting blood sugar once daily.  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 10/26/22   [provider]  hydrALAZINE (APRESOLINE) 25 MG tablet Take 1 tablet (25 mg total) by mouth 3 (three) times daily. 02/15/23   Zannie Cove, MD  insulin aspart (NOVOLOG FLEXPEN) 100 UNIT/ML FlexPen 0-9 Units, Subcutaneous, 3 times daily with meals CBG < 70: Implement Hypoglycemia measures CBG 70 - 120: 0 units CBG 121 - 150: 1 unit CBG 151 - 200: 2 units CBG 201 - 250: 3 units CBG 251 - 300: 5 units CBG 301 - 350: 7 units CBG 351 - 400: 9 units CBG > 400: call MD 10/26/22   Maretta Bees, MD  insulin glargine-yfgn (SEMGLEE) 100 UNIT/ML injection Inject 0.07 mLs (7 Units total) into the skin at bedtime. 10/26/22   Ghimire, Werner Lean, MD  Insulin Pen Needle 32G X 4 MM MISC Use with insulin pens 05/28/22   Leroy Sea, MD  isosorbide mononitrate (IMDUR) 60 MG 24 hr tablet Take 1 tablet (60 mg total) by mouth daily. 10/26/22   Ghimire, Werner Lean, MD  melatonin 3 MG TABS tablet Take 1 tablet (3 mg total) by mouth at bedtime as needed (insomnia). 10/26/22   Ghimire, Werner Lean, MD  mupirocin ointment (BACTROBAN) 2 % Apply 1 Application topically 3 (three) times  daily. 01/07/23   Mardella Layman, MD  ondansetron (ZOFRAN-ODT) 4 MG disintegrating tablet Take 1 tablet (4 mg total) by mouth every 4 (four) hours as needed for nausea/vomiting. 03/05/23   Melene Plan, DO  PROTONIX 40 MG tablet Take 1 tablet (40 mg total) by mouth daily. 05/25/22   Leroy Sea, MD  simethicone (MYLICON) 80 MG chewable tablet Chew 1 tablet (80 mg total) by mouth 4 (four) times daily for 5 days. 11/22/22 12/17/22  Rodolph Bong, MD  gabapentin (NEURONTIN) 300 MG capsule Take 1 capsule (300 mg total) by mouth 3 (three) times daily. Patient not taking: Reported on 07/27/2019 07/29/18 08/30/19  Kallie Locks, FNP  sildenafil (VIAGRA) 25 MG tablet Take 1 tablet (25 mg total) by mouth as needed  for erectile dysfunction. for erectile dysfunction. Patient not taking: Reported on 08/11/2020 10/13/19 11/27/20  Kallie Locks, FNP      Allergies    Sulfa antibiotics, Other, Bactrim [sulfamethoxazole-trimethoprim], and Cephalexin    Review of Systems   Review of Systems  Gastrointestinal:  Positive for abdominal pain.    Physical Exam Updated Vital Signs BP (!) 154/85 (BP Location: Left Arm)   Pulse 79   Temp 97.8 F (36.6 C)   Resp 19   Ht 5\' 7"  (1.702 m)   Wt 81 kg   SpO2 95%   BMI 27.97 kg/m  Physical Exam Vitals and nursing note reviewed.  Constitutional:      General: He is not in acute distress.    Appearance: He is well-developed.  HENT:     Head: Normocephalic and atraumatic.     Mouth/Throat:     Pharynx: No oropharyngeal exudate.  Eyes:     General: No scleral icterus.       Right eye: No discharge.        Left eye: No discharge.     Conjunctiva/sclera: Conjunctivae normal.     Pupils: Pupils are equal, round, and reactive to light.  Neck:     Thyroid: No thyromegaly.     Vascular: No JVD.  Cardiovascular:     Rate and Rhythm: Normal rate and regular rhythm.     Heart sounds: Normal heart sounds. No murmur heard.    No friction rub. No gallop.  Pulmonary:     Effort: Pulmonary effort is normal. No respiratory distress.     Breath sounds: Normal breath sounds. No wheezing or rales.  Abdominal:     General: Bowel sounds are normal. There is distension.     Palpations: Abdomen is soft. There is no mass.     Tenderness: There is no abdominal tenderness.     Comments: This patient is a very rotund abdomen, there is no tenderness, he has normal bowel sounds, slight fluid wave  Musculoskeletal:        General: No tenderness. Normal range of motion.     Cervical back: Normal range of motion and neck supple.     Right lower leg: Edema present.     Left lower leg: Edema present.  Lymphadenopathy:     Cervical: No cervical adenopathy.  Skin:     General: Skin is warm and dry.     Findings: No erythema or rash.  Neurological:     Mental Status: He is alert.     Coordination: Coordination normal.  Psychiatric:        Behavior: Behavior normal.     ED Results / Procedures / Treatments   Labs (all  labs ordered are listed, but only abnormal results are displayed) Labs Reviewed  COMPREHENSIVE METABOLIC PANEL - Abnormal; Notable for the following components:      Result Value   Glucose, Bld 133 (*)    BUN 59 (*)    Creatinine, Ser 10.98 (*)    Calcium 8.0 (*)    Albumin 3.0 (*)    Alkaline Phosphatase 180 (*)    GFR, Estimated 5 (*)    Anion gap 19 (*)    All other components within normal limits  CBC - Abnormal; Notable for the following components:   RBC 4.11 (*)    Hemoglobin 10.3 (*)    HCT 33.8 (*)    MCH 25.1 (*)    RDW 21.1 (*)    All other components within normal limits  LIPASE, BLOOD    EKG None  Radiology DG Chest Portable 1 View  Result Date: 03/09/2023 CLINICAL DATA:  52 year old male under evaluation for fluid. EXAM: PORTABLE CHEST 1 VIEW COMPARISON:  Chest x-ray 02/23/2023. FINDINGS: Patchy areas of interstitial prominence, peribronchial cuffing and ill-defined nodular densities are noted in the lungs bilaterally, most evident in the left mid lung and right lung base. No pleural effusions. Cephalization of the pulmonary vasculature, without frank pulmonary edema. Cardiopericardial silhouette is moderately enlarged, similar to the prior study. Upper mediastinal contours are within normal limits. Old healed fracture of the distal left clavicle with probable pseudoarthrosis. IMPRESSION: 1. The appearance of the chest suggests bronchitis with developing multilobar bilateral bronchopneumonia, most evident in the mid to lower lungs, as above. 2. Cardiomegaly with pulmonary venous congestion, but no frank pulmonary edema. Electronically Signed   By: Trudie Reed M.D.   On: 03/09/2023 07:02     Procedures Procedures    Medications Ordered in ED Medications - No data to display  ED Course/ Medical Decision Making/ A&P                                 Medical Decision Making Amount and/or Complexity of Data Reviewed Labs: ordered.   Patient well-known to this department presents to the emergency room complaining of chronic abdominal pain.  He states he has some mild diarrhea but has been different to his pain.  He is resting comfortably at the time of my assessment.  Differential includes fluid overload, intra-abdominal abnormality, secondary gain.  I reviewed previous notes showing recent visits with missed dialysis, request for nursing home placement due to homelessness, chronic abdominal pain.  Abdomen is soft, nontender, with slight fluid wave.  Labs significant for potassium 3.8, elevated creatinine consistent with need for dialysis session, lipase 31  Patient appears to need dialysis but has scheduled dialysis this morning. At this time it seems that the patient would best be served by discharge for outpatient dialysis. There does not appear to be an indication for emergent dialysis in the hospital, and this would likely be completed later than his outpatient session.  Patient is not hypoxic, tachycardic, tachypneic.  Labs grossly at baseline.  Discharge home.    Final Clinical Impression(s) / ED Diagnoses Final diagnoses:  Abdominal pain, unspecified abdominal location    Rx / DC Orders ED Discharge Orders     None           Pamala Duffel 03/11/23 [id number]    Tilden Fossa, MD 03/11/23 [phone]

## 2023-03-11 NOTE — ED Notes (Signed)
Patient denies chest x-ray. He states he just doesn't feel like doing it. Doesn't want it. Then went back to sleep.

## 2023-03-11 NOTE — Discharge Instructions (Signed)
Make sure to follow-up with your dialysis clinic outpatient

## 2023-03-11 NOTE — ED Triage Notes (Addendum)
Pt arrived POV for recurrent chronic abd pain, was seen 10/20 for same as well, advised to have dialysis, pt reports dialysis is T, TH, Sat. Did not have dialysis Sat 10/19, last treatment was Thursday 10/17. Pt reports intermittent n/v and bloating. Pt denies taking BP med, pt appears irritable in triage, head down with eyes closed, turn to the side, when answering questions, pt is harsh with his words. A&O x4.   Pt reports does not make urine

## 2023-03-11 NOTE — Discharge Instructions (Signed)
It is important that you go to your dialysis session today.  Your lab work was reassuring.  Please go immediately to dialysis from the emergency department.  If you develop any life-threatening symptoms return to the emergency department.

## 2023-03-11 NOTE — ED Provider Notes (Signed)
[place] EMERGENCY DEPARTMENT AT [facility] Provider Note   CSN: [id number] Arrival date & time: 03/11/23  1844    History  Chief Complaint  Patient presents with   Missed Dialysis    [name] is a 52 y.o. male multiple medical comorbidities including end-stage renal disease on Tuesday, Thursday, Saturday here for evaluation of missed dialysis.  He missed Saturday as well as today.  He is actually seen the emergency department earlier today and was told to go to dialysis today.  He states he could not find transportation and therefore did not go.  He presents here requesting dialysis.  He also states he is homeless and he would like a place to live.  He frequently request resources for a nursing home so he has "someplace warm to sleep."  He was seen in the emergency department on the 22nd, 20th, 19th, 17th, 16th twice, 15th, 13, 12, 6, third. Typically comes for abdominal pain and nausea.  He tells me today he does not have any pain or nausea and would actually like something to eat.  Denies any fever, chest pain, shortness of breath, increase in LE swelling, abdominal pain, diarrhea or dysuria.  HPI     Home Medications Prior to Admission medications   Medication Sig Start Date End Date Taking? Authorizing Provider  acetaminophen (TYLENOL) 500 MG tablet Take 1 tablet (500 mg total) by mouth every 8 (eight) hours as needed for mild pain. 05/28/22   Leroy Sea, MD  ASPIRIN LOW DOSE 81 MG tablet Take 1 tablet (81 mg total) by mouth daily. 05/28/22   Leroy Sea, MD  calcitRIOL (ROCALTROL) 0.25 MCG capsule Take 1 capsule (0.25 mcg total) by mouth every Tuesday, Thursday, and Saturday at 6 PM. 10/26/22   Ghimire, Werner Lean, MD  calcium acetate (PHOSLO) 667 MG tablet Take 3 tablets (2,001 mg total) by mouth 3 (three) times daily. 12/12/22     calcium acetate (PHOSLO) 667 MG tablet Take 3 tablets (2,001 mg total) by mouth 3 (three) times daily. 03/06/23     carvedilol  (COREG) 12.5 MG tablet Take 1 tablet by mouth twice a day with meals 03/06/23     doxycycline (VIBRAMYCIN) 100 MG capsule Take 1 capsule (100 mg total) by mouth 2 (two) times daily. 03/09/23   Mesner, Barbara Cower, MD  glucose blood test strip Use to check fasting blood sugar once daily. 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 10/26/22   [provider]  hydrALAZINE (APRESOLINE) 25 MG tablet Take 1 tablet (25 mg total) by mouth 3 (three) times daily. 02/15/23   Zannie Cove, MD  insulin aspart (NOVOLOG FLEXPEN) 100 UNIT/ML FlexPen 0-9 Units, Subcutaneous, 3 times daily with meals CBG < 70: Implement Hypoglycemia measures CBG 70 - 120: 0 units CBG 121 - 150: 1 unit CBG 151 - 200: 2 units CBG 201 - 250: 3 units CBG 251 - 300: 5 units CBG 301 - 350: 7 units CBG 351 - 400: 9 units CBG > 400: call MD 10/26/22   Maretta Bees, MD  insulin glargine-yfgn (SEMGLEE) 100 UNIT/ML injection Inject 0.07 mLs (7 Units total) into the skin at bedtime. 10/26/22   Ghimire, Werner Lean, MD  Insulin Pen Needle 32G X 4 MM MISC Use with insulin pens 05/28/22   Leroy Sea, MD  isosorbide mononitrate (IMDUR) 60 MG 24 hr tablet Take 1 tablet (  60 mg total) by mouth daily. 10/26/22   Ghimire, Werner Lean, MD  melatonin 3 MG TABS tablet Take 1 tablet (3 mg total) by mouth at bedtime as needed (insomnia). 10/26/22   Ghimire, Werner Lean, MD  mupirocin ointment (BACTROBAN) 2 % Apply 1 Application topically 3 (three) times daily. 01/07/23   Mardella Layman, MD  ondansetron (ZOFRAN-ODT) 4 MG disintegrating tablet Take 1 tablet (4 mg total) by mouth every 4 (four) hours as needed for nausea/vomiting. 03/05/23   Melene Plan, DO  PROTONIX 40 MG tablet Take 1 tablet (40 mg total) by mouth daily. 05/25/22   Leroy Sea, MD  simethicone (MYLICON) 80 MG chewable tablet Chew 1 tablet (80 mg total) by mouth 4 (four) times daily for 5 days. 11/22/22 12/17/22   Rodolph Bong, MD  gabapentin (NEURONTIN) 300 MG capsule Take 1 capsule (300 mg total) by mouth 3 (three) times daily. Patient not taking: Reported on 07/27/2019 07/29/18 08/30/19  Kallie Locks, FNP  sildenafil (VIAGRA) 25 MG tablet Take 1 tablet (25 mg total) by mouth as needed for erectile dysfunction. for erectile dysfunction. Patient not taking: Reported on 08/11/2020 10/13/19 11/27/20  Kallie Locks, FNP      Allergies    Sulfa antibiotics, Other, Bactrim [sulfamethoxazole-trimethoprim], and Cephalexin    Review of Systems   Review of Systems  Constitutional: Negative.   HENT: Negative.    Respiratory: Negative.    Cardiovascular: Negative.   Gastrointestinal: Negative.   Genitourinary: Negative.   Musculoskeletal: Negative.   Skin: Negative.   Neurological: Negative.   All other systems reviewed and are negative.  Physical Exam Updated Vital Signs BP (!) 179/99   Pulse 86   Temp 98.8 F (37.1 C) (Oral)   SpO2 98%  Physical Exam Vitals and nursing note reviewed.  Constitutional:      General: He is not in acute distress.    Appearance: He is well-developed. He is not ill-appearing, toxic-appearing or diaphoretic.  HENT:     Head: Normocephalic and atraumatic.     Nose: Nose normal.     Mouth/Throat:     Mouth: Mucous membranes are moist.  Eyes:     Pupils: Pupils are equal, round, and reactive to light.  Cardiovascular:     Rate and Rhythm: Normal rate and regular rhythm.     Pulses: Normal pulses.     Heart sounds: Normal heart sounds.  Pulmonary:     Effort: Pulmonary effort is normal. No respiratory distress.     Breath sounds: Normal breath sounds.  Abdominal:     General: Bowel sounds are normal. There is distension.     Palpations: Abdomen is soft.     Comments: Distended, nontender, normal active bowel sounds  Musculoskeletal:        General: No swelling, tenderness, deformity or signs of injury. Normal range of motion.     Cervical back:  Normal range of motion and neck supple.     Right lower leg: No edema.     Left lower leg: No edema.     Comments: Right upper extremity dialysis graft with thrill  Skin:    General: Skin is warm and dry.     Capillary Refill: Capillary refill takes less than 2 seconds.  Neurological:     General: No focal deficit present.     Mental Status: He is alert and oriented to person, place, and time.    ED Results / Procedures / Treatments   Labs (  all labs ordered are listed, but only abnormal results are displayed) Labs Reviewed  CBC WITH DIFFERENTIAL/PLATELET - Abnormal; Notable for the following components:      Result Value   RBC 3.99 (*)    Hemoglobin 9.9 (*)    HCT 33.8 (*)    MCH 24.8 (*)    MCHC 29.3 (*)    RDW 21.2 (*)    Monocytes Absolute 1.2 (*)    All other components within normal limits  COMPREHENSIVE METABOLIC PANEL - Abnormal; Notable for the following components:   CO2 20 (*)    Glucose, Bld 136 (*)    BUN 59 (*)    Creatinine, Ser 10.94 (*)    Calcium 7.0 (*)    Albumin 2.8 (*)    Alkaline Phosphatase 166 (*)    GFR, Estimated 5 (*)    All other components within normal limits    EKG None  Radiology No results found.  Procedures Procedures    Medications Ordered in ED Medications - No data to display  ED Course/ Medical Decision Making/ A&P   52 year old multiple medical problems frequent ED visits including most recently seen 12 hours PTA here requesting dialysis.  He is currently asymptomatic.  Typically has ED visits for abdominal pain and nausea however he declines such currently was actually requesting food and drink.  He missed dialysis on Saturday as well as Tuesday.  He denies any chest pain, shortness of breath.  Will plan on checking basic labs.  Labs personally viewed and interpreted:  CBC without leukocytosis, Hgb 9.9 CMP creatinine 10.94, Potassium 4.1, calcium 7.0  Patient reassessed, sleeping in bed.  Given multiple visits  requesting dialysis will touch base with nephrology.  Discussed with Dr. Marisue Humble with nephrology.  States patient can follow-up outpatient does not need emergent dialysis here in the ED today.  Discussed low calcium here in ED today. Patient refusing treatment for this. Discussed risk vs benefits. Voiced understanding and declines at this time.  On dc patient requesting nursing home placement. He typically requests this during his ED visits  The patient has been appropriately medically screened and/or stabilized in the ED. I have low suspicion for any other emergent medical condition which would require further screening, evaluation or treatment in the ED or require inpatient management.  Patient is hemodynamically stable and in no acute distress.  Patient able to ambulate in department prior to ED.  Evaluation does not show acute pathology that would require ongoing or additional emergent interventions while in the emergency department or further inpatient treatment.  I have discussed the diagnosis with the patient and answered all questions.  Pain is been managed while in the emergency department and patient has no further complaints prior to discharge.  Patient is comfortable with plan discussed in room and is stable for discharge at this time.  I have discussed strict return precautions for returning to the emergency department.  Patient was encouraged to follow-up with PCP/specialist refer to at discharge.                                Medical Decision Making Amount and/or Complexity of Data Reviewed External Data Reviewed: labs, radiology and notes. Labs: ordered. Decision-making details documented in ED Course. Radiology: ordered.  Risk OTC drugs. Prescription drug management. Parenteral controlled substances. Decision regarding hospitalization. Diagnosis or treatment significantly limited by social determinants of health.  Final Clinical Impression(s) / ED  Diagnoses Final diagnoses:  ESRD (end stage renal disease) (HCC)  Hypocalcemia    Rx / DC Orders ED Discharge Orders     None         Joye Wesenberg A, PA-C 03/11/23 2045    Jacalyn Lefevre, MD 03/12/23 0017

## 2023-03-11 NOTE — ED Triage Notes (Signed)
Pt bib ems with reports of needing dialysis. Last treatment 10/17.  BP 182/101 HR 85 12 lead LBBB

## 2023-03-12 DIAGNOSIS — M791 Myalgia, unspecified site: Secondary | ICD-10-CM | POA: Diagnosis not present

## 2023-03-12 DIAGNOSIS — R110 Nausea: Secondary | ICD-10-CM | POA: Diagnosis not present

## 2023-03-12 DIAGNOSIS — Z992 Dependence on renal dialysis: Secondary | ICD-10-CM | POA: Diagnosis not present

## 2023-03-12 DIAGNOSIS — I447 Left bundle-branch block, unspecified: Secondary | ICD-10-CM | POA: Diagnosis not present

## 2023-03-12 DIAGNOSIS — Z91158 Patient's noncompliance with renal dialysis for other reason: Secondary | ICD-10-CM | POA: Diagnosis not present

## 2023-03-12 DIAGNOSIS — Z20822 Contact with and (suspected) exposure to covid-19: Secondary | ICD-10-CM | POA: Diagnosis not present

## 2023-03-12 DIAGNOSIS — R42 Dizziness and giddiness: Secondary | ICD-10-CM | POA: Diagnosis not present

## 2023-03-12 DIAGNOSIS — R0789 Other chest pain: Secondary | ICD-10-CM | POA: Diagnosis not present

## 2023-03-12 DIAGNOSIS — G4489 Other headache syndrome: Secondary | ICD-10-CM | POA: Diagnosis not present

## 2023-03-12 DIAGNOSIS — R000 Tachycardia, unspecified: Secondary | ICD-10-CM | POA: Diagnosis not present

## 2023-03-13 ENCOUNTER — Emergency Department (HOSPITAL_COMMUNITY)
Admission: EM | Admit: 2023-03-13 | Discharge: 2023-03-14 | Disposition: A | Discharge status: Home / Self Care | Payer: 59 | Attending: Emergency Medicine | Admitting: Emergency Medicine

## 2023-03-13 ENCOUNTER — Emergency Department (HOSPITAL_COMMUNITY): Payer: 59

## 2023-03-13 ENCOUNTER — Other Ambulatory Visit: Payer: Self-pay

## 2023-03-13 DIAGNOSIS — Z794 Long term (current) use of insulin: Secondary | ICD-10-CM | POA: Diagnosis not present

## 2023-03-13 DIAGNOSIS — R0602 Shortness of breath: Secondary | ICD-10-CM | POA: Diagnosis not present

## 2023-03-13 DIAGNOSIS — J189 Pneumonia, unspecified organism: Secondary | ICD-10-CM | POA: Diagnosis not present

## 2023-03-13 DIAGNOSIS — R7989 Other specified abnormal findings of blood chemistry: Secondary | ICD-10-CM | POA: Diagnosis not present

## 2023-03-13 DIAGNOSIS — N186 End stage renal disease: Secondary | ICD-10-CM

## 2023-03-13 DIAGNOSIS — I517 Cardiomegaly: Secondary | ICD-10-CM | POA: Diagnosis not present

## 2023-03-13 DIAGNOSIS — Z79899 Other long term (current) drug therapy: Secondary | ICD-10-CM | POA: Insufficient documentation

## 2023-03-13 DIAGNOSIS — Z7982 Long term (current) use of aspirin: Secondary | ICD-10-CM | POA: Insufficient documentation

## 2023-03-13 DIAGNOSIS — R079 Chest pain, unspecified: Secondary | ICD-10-CM | POA: Diagnosis not present

## 2023-03-13 DIAGNOSIS — I120 Hypertensive chronic kidney disease with stage 5 chronic kidney disease or end stage renal disease: Secondary | ICD-10-CM | POA: Diagnosis not present

## 2023-03-13 DIAGNOSIS — R918 Other nonspecific abnormal finding of lung field: Secondary | ICD-10-CM | POA: Diagnosis not present

## 2023-03-13 DIAGNOSIS — Z992 Dependence on renal dialysis: Secondary | ICD-10-CM | POA: Insufficient documentation

## 2023-03-13 LAB — CBC
HCT: 42.4 % (ref 39.0–52.0)
Hemoglobin: 12.7 g/dL — ABNORMAL LOW (ref 13.0–17.0)
MCH: 24.9 pg — ABNORMAL LOW (ref 26.0–34.0)
MCHC: 30 g/dL (ref 30.0–36.0)
MCV: 83 fL (ref 80.0–100.0)
Platelets: 400 10*3/uL (ref 150–400)
RBC: 5.11 MIL/uL (ref 4.22–5.81)
RDW: 21.3 % — ABNORMAL HIGH (ref 11.5–15.5)
WBC: 8.2 10*3/uL (ref 4.0–10.5)
nRBC: 0 % (ref 0.0–0.2)

## 2023-03-13 LAB — BASIC METABOLIC PANEL
Anion gap: 18 — ABNORMAL HIGH (ref 5–15)
BUN: 40 mg/dL — ABNORMAL HIGH (ref 6–20)
CO2: 21 mmol/L — ABNORMAL LOW (ref 22–32)
Calcium: 8 mg/dL — ABNORMAL LOW (ref 8.9–10.3)
Chloride: 96 mmol/L — ABNORMAL LOW (ref 98–111)
Creatinine, Ser: 8.42 mg/dL — ABNORMAL HIGH (ref 0.61–1.24)
GFR, Estimated: 7 mL/min — ABNORMAL LOW (ref >60)
Glucose, Bld: 125 mg/dL — ABNORMAL HIGH (ref 70–99)
Potassium: 3.8 mmol/L (ref 3.5–5.1)
Sodium: 135 mmol/L (ref 135–145)

## 2023-03-13 LAB — TROPONIN I (HIGH SENSITIVITY): Troponin I (High Sensitivity): 191 ng/L (ref <18)

## 2023-03-13 NOTE — ED Notes (Signed)
Pt called back for triage. Pt told this tech to wait, he is on a phone call. Triage nurse notified.

## 2023-03-13 NOTE — ED Triage Notes (Signed)
Patient reports mid chest pain with SOB today , no emesis or diaphoresis .

## 2023-03-13 NOTE — ED Notes (Signed)
Dr. Posey Rea ( EDP ) and charge notified on patient's elevated Troponin result .

## 2023-03-13 NOTE — ED Notes (Signed)
Patient continues to remain on phone call and declining to be triaged.

## 2023-03-14 ENCOUNTER — Other Ambulatory Visit: Payer: Self-pay

## 2023-03-14 LAB — TROPONIN I (HIGH SENSITIVITY): Troponin I (High Sensitivity): 180 ng/L (ref <18)

## 2023-03-14 MED ORDER — DOXYCYCLINE HYCLATE 100 MG PO TABS
100.0000 mg | ORAL_TABLET | Freq: Once | ORAL | Status: AC
  Administered 2023-03-14: 100 mg via ORAL
  Filled 2023-03-14: qty 1

## 2023-03-14 MED ORDER — AMOXICILLIN-POT CLAVULANATE 250-125 MG PO TABS: 1.0000 | ORAL_TABLET | Freq: Every day | ORAL | 0 refills | Status: DC

## 2023-03-14 MED ORDER — DOXYCYCLINE HYCLATE 100 MG PO CAPS: 100.0000 mg | ORAL_CAPSULE | Freq: Two times a day (BID) | ORAL | 0 refills | Status: DC

## 2023-03-14 MED ORDER — AMOXICILLIN-POT CLAVULANATE 250-125 MG PO TABS
1.0000 | ORAL_TABLET | ORAL | Status: DC
  Administered 2023-03-14: 1 via ORAL
  Filled 2023-03-14: qty 1

## 2023-03-14 NOTE — ED Provider Notes (Signed)
Nurse tells me that patient had a small amount of vomit just prior to discharge.  He had not had any vomiting while sleeping for several hours in the ER.  No signs of respiratory distress.  Since this is an isolated issue, patient will still be discharged   Douglas Rhine, MD 03/14/23 [phone]

## 2023-03-14 NOTE — ED Provider Notes (Signed)
[facility] AT [facility] Provider Note   CSN: [id number] Arrival date & time: 03/13/23  2213     History  Chief Complaint  Patient presents with   Chest Pain    [name] is a 52 y.o. male.  The history is provided by the patient.  Patient with end-stage renal disease presents for evaluation.  He is reporting chest pain and shortness of breath.  He just had dialysis.  No vomiting, no fevers.  No other acute complaints     Home Medications Prior to Admission medications   Medication Sig Start Date End Date Taking? Authorizing Provider  amoxicillin-clavulanate (AUGMENTIN) 250-125 MG tablet Take 1 tablet by mouth daily. 03/14/23  Yes Zadie Rhine, MD  doxycycline (VIBRAMYCIN) 100 MG capsule Take 1 capsule (100 mg total) by mouth 2 (two) times daily. One po bid x 7 days 03/14/23  Yes Zadie Rhine, MD  acetaminophen (TYLENOL) 500 MG tablet Take 1 tablet (500 mg total) by mouth every 8 (eight) hours as needed for mild pain. 05/28/22   Leroy Sea, MD  ASPIRIN LOW DOSE 81 MG tablet Take 1 tablet (81 mg total) by mouth daily. 05/28/22   Leroy Sea, MD  calcitRIOL (ROCALTROL) 0.25 MCG capsule Take 1 capsule (0.25 mcg total) by mouth every Tuesday, Thursday, and Saturday at 6 PM. 10/26/22   Ghimire, Werner Lean, MD  calcium acetate (PHOSLO) 667 MG tablet Take 3 tablets (2,001 mg total) by mouth 3 (three) times daily. 12/12/22     calcium acetate (PHOSLO) 667 MG tablet Take 3 tablets (2,001 mg total) by mouth 3 (three) times daily. 03/06/23     carvedilol (COREG) 12.5 MG tablet Take 1 tablet by mouth twice a day with meals 03/06/23     glucose blood test strip Use to check fasting blood sugar once daily. 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 10/26/22   [provider]  hydrALAZINE (APRESOLINE) 25 MG tablet Take 1 tablet (25 mg total) by mouth 3  (three) times daily. 02/15/23   Zannie Cove, MD  insulin aspart (NOVOLOG FLEXPEN) 100 UNIT/ML FlexPen 0-9 Units, Subcutaneous, 3 times daily with meals CBG < 70: Implement Hypoglycemia measures CBG 70 - 120: 0 units CBG 121 - 150: 1 unit CBG 151 - 200: 2 units CBG 201 - 250: 3 units CBG 251 - 300: 5 units CBG 301 - 350: 7 units CBG 351 - 400: 9 units CBG > 400: call MD 10/26/22   Maretta Bees, MD  insulin glargine-yfgn (SEMGLEE) 100 UNIT/ML injection Inject 0.07 mLs (7 Units total) into the skin at bedtime. 10/26/22   Ghimire, Werner Lean, MD  Insulin Pen Needle 32G X 4 MM MISC Use with insulin pens 05/28/22   Leroy Sea, MD  isosorbide mononitrate (IMDUR) 60 MG 24 hr tablet Take 1 tablet (60 mg total) by mouth daily. 10/26/22   Ghimire, Werner Lean, MD  melatonin 3 MG TABS tablet Take 1 tablet (3 mg total) by mouth at bedtime as needed (insomnia). 10/26/22   Ghimire, Werner Lean, MD  mupirocin ointment (BACTROBAN) 2 % Apply 1 Application topically 3 (three) times daily. 01/07/23   Mardella Layman, MD  ondansetron (ZOFRAN-ODT) 4 MG disintegrating tablet Take 1 tablet (4 mg total) by mouth every 4 (four) hours as needed for nausea/vomiting. 03/05/23   Melene Plan, DO  PROTONIX 40 MG tablet Take  1 tablet (40 mg total) by mouth daily. 05/25/22   Leroy Sea, MD  simethicone (MYLICON) 80 MG chewable tablet Chew 1 tablet (80 mg total) by mouth 4 (four) times daily for 5 days. 11/22/22 12/17/22  Rodolph Bong, MD  gabapentin (NEURONTIN) 300 MG capsule Take 1 capsule (300 mg total) by mouth 3 (three) times daily. Patient not taking: Reported on 07/27/2019 07/29/18 08/30/19  Kallie Locks, FNP  sildenafil (VIAGRA) 25 MG tablet Take 1 tablet (25 mg total) by mouth as needed for erectile dysfunction. for erectile dysfunction. Patient not taking: Reported on 08/11/2020 10/13/19 11/27/20  Kallie Locks, FNP      Allergies    Sulfa antibiotics, Other, Bactrim [sulfamethoxazole-trimethoprim], and Cephalexin     Review of Systems   Review of Systems  Constitutional:  Negative for fever.  Cardiovascular:  Positive for chest pain.    Physical Exam Updated Vital Signs BP (!) 155/89   Pulse 82   Temp 97.8 F (36.6 C) (Oral)   Resp 17   SpO2 100%  Physical Exam CONSTITUTIONAL: Chronically ill-appearing, no acute distress HEAD: Normocephalic/atraumatic ENMT: Mucous membranes moist NECK: supple no meningeal signs CV: S1/S2 noted LUNGS: Coarse breath sounds bilaterally, no acute distress ABDOMEN: soft, nontender NEURO: Pt is awake/alert/appropriate, moves all extremitiesx4.  No facial droop.   SKIN: warm, color normal PSYCH: no abnormalities of mood noted, alert and oriented to situation  ED Results / Procedures / Treatments   Labs (all labs ordered are listed, but only abnormal results are displayed) Labs Reviewed  BASIC METABOLIC PANEL - Abnormal; Notable for the following components:      Result Value   Chloride 96 (*)    CO2 21 (*)    Glucose, Bld 125 (*)    BUN 40 (*)    Creatinine, Ser 8.42 (*)    Calcium 8.0 (*)    GFR, Estimated 7 (*)    Anion gap 18 (*)    All other components within normal limits  CBC - Abnormal; Notable for the following components:   Hemoglobin 12.7 (*)    MCH 24.9 (*)    RDW 21.3 (*)    All other components within normal limits  TROPONIN I (HIGH SENSITIVITY) - Abnormal; Notable for the following components:   Troponin I (High Sensitivity) 191 (*)    All other components within normal limits  TROPONIN I (HIGH SENSITIVITY) - Abnormal; Notable for the following components:   Troponin I (High Sensitivity) 180 (*)    All other components within normal limits    EKG EKG Interpretation Date/Time:  Thursday March 13 2023 22:39:54 EDT Ventricular Rate:  82 PR Interval:  148 QRS Duration:  154 QT Interval:  464 QTC Calculation: 542 R Axis:   160  Text Interpretation: Normal sinus rhythm Biatrial enlargement Non-specific intra-ventricular  conduction block Right ventricular hypertrophy Possible Lateral infarct , age undetermined Abnormal ECG Confirmed by Zadie Rhine ([id number]) on 03/14/2023 12:14:34 AM  Radiology DG Chest 2 View  Result Date: 03/13/2023 CLINICAL DATA:  Chest pain EXAM: CHEST - 2 VIEW COMPARISON:  03/09/2023 FINDINGS: Diffuse cardiac enlargement. No vascular congestion. Nodular opacities in the left mid lung and right lower lung, similar to prior study. No significant pulmonary nodules were demonstrated on chest CT from 12/20/2022 suggesting that this is likely infectious. Consider multifocal pneumonia. Follow-up to resolution is recommended to exclude underlying focal lesions. No pleural effusions. No pneumothorax. Old right rib fractures. IMPRESSION: 1. Cardiac enlargement. 2. Nodular opacities  in the left mid lung and right lower lung similar to prior study, most likely multifocal pneumonia. Follow-up to resolution is recommended to exclude underlying focal lesions. Electronically Signed   By: Burman Nieves M.D.   On: 03/13/2023 22:48    Procedures Procedures    Medications Ordered in ED Medications  amoxicillin-clavulanate (AUGMENTIN) 250-125 MG per tablet 1 tablet (1 tablet Oral Given 03/14/23 0218)  doxycycline (VIBRA-TABS) tablet 100 mg (100 mg Oral Given 03/14/23 1610)    ED Course/ Medical Decision Making/ A&P Clinical Course as of 03/14/23 0330  Fri Mar 14, 2023  0126 Patient presents for his 40th ER visit in 6 months.  He just had dialysis and has no emergent indications for dialysis at this time.  X-ray consistent with pneumonia, no obvious recent antibiotics.  Will start antibiotics here in the ER.  Patient is in no acute distress [DW]  0203 Discussed with pharmacy about medication choices.  They recommend Augmentin 250 daily as well as doxycycline 100 twice daily.  This should not have any issues with previous allergies [DW]  0329 Patient stay in the ER and sleeping.  He is stable on his home  oxygen. Patient had an abnormal x-ray on multiple recent ED visits but it does not appear that he has been on antibiotics.  At this point he will be placed on oral antibiotics but does not meet criteria for admission and he can continue his dialysis as an outpatient.  Patient is not septic appearing & there is no signs of respiratory distress.  He has slept throughout the ED stay [DW]    Clinical Course User Index [DW] Zadie Rhine, MD                                 Medical Decision Making Amount and/or Complexity of Data Reviewed Labs: ordered. Radiology: ordered.  Risk Prescription drug management.   This patient presents to the ED for concern of chest pain and shortness of breath, this involves an extensive number of treatment options, and is a complaint that carries with it a high risk of complications and morbidity.  The differential diagnosis includes but is not limited to acute coronary syndrome, aortic dissection, pulmonary embolism, pericarditis, pneumothorax, pneumonia, myocarditis, pleurisy, esophageal rupture    Comorbidities that complicate the patient evaluation: Patient's presentation is complicated by their history of end-stage renal disease  Social Determinants of Health: Patient's  multiple ER visits   increases the complexity of managing their presentation  Additional history obtained: Records reviewed previous admission documents  Lab Tests: I Ordered, and personally interpreted labs.  The pertinent results include: Chronic renal failure, chronically elevated troponin  Imaging Studies ordered: I ordered imaging studies including X-ray chest   I independently visualized and interpreted imaging which showed pneumonia I agree with the radiologist interpretation   Medicines ordered and prescription drug management: I ordered medication including Augmentin and doxycycline for pneumonia Reevaluation of the patient after these medicines showed that the  patient    stayed the same   Critical Interventions:   antibiotics  Consultations Obtained: I requested consultation with the consultant pharmacy , and discussed  findings as well as pertinent plan - they recommend: Augmentin and Doxycycline  Reevaluation: After the interventions noted above, I reevaluated the patient and found that they have :stayed the same  Complexity of problems addressed: Patient's presentation is most consistent with  acute presentation with potential threat to  life or bodily function  Disposition: After consideration of the diagnostic results and the patient's response to treatment,  I feel that the patent would benefit from discharge   .           Final Clinical Impression(s) / ED Diagnoses Final diagnoses:  ESRD (end stage renal disease) (HCC)  Community acquired pneumonia, unspecified laterality    Rx / DC Orders ED Discharge Orders          Ordered    doxycycline (VIBRAMYCIN) 100 MG capsule  2 times daily        03/14/23 0326    amoxicillin-clavulanate (AUGMENTIN) 250-125 MG tablet  Daily        03/14/23 0326              Zadie Rhine, MD 03/14/23 [phone]

## 2023-03-14 NOTE — ED Notes (Signed)
Patient refusing to be discharged. Patient vomited small amount. MD notified. Patient agrees to be discharged.

## 2023-03-15 ENCOUNTER — Encounter (HOSPITAL_COMMUNITY): Payer: Self-pay | Admitting: Emergency Medicine

## 2023-03-15 ENCOUNTER — Other Ambulatory Visit: Payer: Self-pay

## 2023-03-15 ENCOUNTER — Emergency Department (HOSPITAL_COMMUNITY)
Admission: EM | Admit: 2023-03-15 | Discharge: 2023-03-15 | Disposition: A | Discharge status: Home / Self Care | Payer: 59 | Source: Home / Self Care

## 2023-03-15 ENCOUNTER — Emergency Department (HOSPITAL_COMMUNITY): Payer: 59

## 2023-03-15 ENCOUNTER — Observation Stay (HOSPITAL_COMMUNITY)
Admission: EM | Admit: 2023-03-15 | Discharge: 2023-03-17 | Disposition: A | Discharge status: Home / Self Care | Payer: 59 | Attending: Internal Medicine | Admitting: Internal Medicine

## 2023-03-15 ENCOUNTER — Encounter (HOSPITAL_COMMUNITY): Payer: Self-pay | Admitting: *Deleted

## 2023-03-15 DIAGNOSIS — D631 Anemia in chronic kidney disease: Secondary | ICD-10-CM | POA: Insufficient documentation

## 2023-03-15 DIAGNOSIS — Z5986 Financial insecurity: Secondary | ICD-10-CM | POA: Insufficient documentation

## 2023-03-15 DIAGNOSIS — R9431 Abnormal electrocardiogram [ECG] [EKG]: Secondary | ICD-10-CM | POA: Diagnosis not present

## 2023-03-15 DIAGNOSIS — J9621 Acute and chronic respiratory failure with hypoxia: Secondary | ICD-10-CM | POA: Diagnosis not present

## 2023-03-15 DIAGNOSIS — Z992 Dependence on renal dialysis: Secondary | ICD-10-CM | POA: Diagnosis not present

## 2023-03-15 DIAGNOSIS — I152 Hypertension secondary to endocrine disorders: Secondary | ICD-10-CM | POA: Diagnosis present

## 2023-03-15 DIAGNOSIS — I509 Heart failure, unspecified: Secondary | ICD-10-CM

## 2023-03-15 DIAGNOSIS — E8770 Fluid overload, unspecified: Secondary | ICD-10-CM | POA: Diagnosis not present

## 2023-03-15 DIAGNOSIS — E1165 Type 2 diabetes mellitus with hyperglycemia: Secondary | ICD-10-CM | POA: Insufficient documentation

## 2023-03-15 DIAGNOSIS — D638 Anemia in other chronic diseases classified elsewhere: Secondary | ICD-10-CM | POA: Diagnosis present

## 2023-03-15 DIAGNOSIS — Z794 Long term (current) use of insulin: Secondary | ICD-10-CM | POA: Diagnosis not present

## 2023-03-15 DIAGNOSIS — N186 End stage renal disease: Secondary | ICD-10-CM

## 2023-03-15 DIAGNOSIS — Z5900 Homelessness unspecified: Secondary | ICD-10-CM | POA: Insufficient documentation

## 2023-03-15 DIAGNOSIS — Z79899 Other long term (current) drug therapy: Secondary | ICD-10-CM | POA: Diagnosis not present

## 2023-03-15 DIAGNOSIS — I5043 Acute on chronic combined systolic (congestive) and diastolic (congestive) heart failure: Secondary | ICD-10-CM | POA: Diagnosis not present

## 2023-03-15 DIAGNOSIS — S2241XA Multiple fractures of ribs, right side, initial encounter for closed fracture: Secondary | ICD-10-CM | POA: Diagnosis not present

## 2023-03-15 DIAGNOSIS — I132 Hypertensive heart and chronic kidney disease with heart failure and with stage 5 chronic kidney disease, or end stage renal disease: Secondary | ICD-10-CM | POA: Insufficient documentation

## 2023-03-15 DIAGNOSIS — E119 Type 2 diabetes mellitus without complications: Secondary | ICD-10-CM

## 2023-03-15 DIAGNOSIS — Z7982 Long term (current) use of aspirin: Secondary | ICD-10-CM | POA: Insufficient documentation

## 2023-03-15 DIAGNOSIS — Z1152 Encounter for screening for COVID-19: Secondary | ICD-10-CM | POA: Insufficient documentation

## 2023-03-15 DIAGNOSIS — Z5321 Procedure and treatment not carried out due to patient leaving prior to being seen by health care provider: Secondary | ICD-10-CM | POA: Insufficient documentation

## 2023-03-15 DIAGNOSIS — R0602 Shortness of breath: Secondary | ICD-10-CM | POA: Diagnosis not present

## 2023-03-15 DIAGNOSIS — R059 Cough, unspecified: Secondary | ICD-10-CM | POA: Insufficient documentation

## 2023-03-15 DIAGNOSIS — E1122 Type 2 diabetes mellitus with diabetic chronic kidney disease: Secondary | ICD-10-CM | POA: Diagnosis not present

## 2023-03-15 DIAGNOSIS — J189 Pneumonia, unspecified organism: Secondary | ICD-10-CM | POA: Diagnosis not present

## 2023-03-15 DIAGNOSIS — N2581 Secondary hyperparathyroidism of renal origin: Secondary | ICD-10-CM | POA: Diagnosis not present

## 2023-03-15 DIAGNOSIS — J9601 Acute respiratory failure with hypoxia: Secondary | ICD-10-CM | POA: Diagnosis present

## 2023-03-15 DIAGNOSIS — I10 Essential (primary) hypertension: Secondary | ICD-10-CM | POA: Diagnosis present

## 2023-03-15 DIAGNOSIS — R918 Other nonspecific abnormal finding of lung field: Secondary | ICD-10-CM | POA: Diagnosis not present

## 2023-03-15 LAB — COMPREHENSIVE METABOLIC PANEL
ALT: 9 U/L (ref 0–44)
AST: 20 U/L (ref 15–41)
Albumin: 3 g/dL — ABNORMAL LOW (ref 3.5–5.0)
Alkaline Phosphatase: 189 U/L — ABNORMAL HIGH (ref 38–126)
Anion gap: 17 — ABNORMAL HIGH (ref 5–15)
BUN: 53 mg/dL — ABNORMAL HIGH (ref 6–20)
CO2: 24 mmol/L (ref 22–32)
Calcium: 7.7 mg/dL — ABNORMAL LOW (ref 8.9–10.3)
Chloride: 97 mmol/L — ABNORMAL LOW (ref 98–111)
Creatinine, Ser: 10.57 mg/dL — ABNORMAL HIGH (ref 0.61–1.24)
GFR, Estimated: 5 mL/min — ABNORMAL LOW (ref >60)
Glucose, Bld: 181 mg/dL — ABNORMAL HIGH (ref 70–99)
Potassium: 4.2 mmol/L (ref 3.5–5.1)
Sodium: 138 mmol/L (ref 135–145)
Total Bilirubin: 0.5 mg/dL (ref 0.3–1.2)
Total Protein: 8.1 g/dL (ref 6.5–8.1)

## 2023-03-15 LAB — BASIC METABOLIC PANEL
Anion gap: 15 (ref 5–15)
BUN: 52 mg/dL — ABNORMAL HIGH (ref 6–20)
CO2: 18 mmol/L — ABNORMAL LOW (ref 22–32)
Calcium: 7.5 mg/dL — ABNORMAL LOW (ref 8.9–10.3)
Chloride: 101 mmol/L (ref 98–111)
Creatinine, Ser: 9.79 mg/dL — ABNORMAL HIGH (ref 0.61–1.24)
GFR, Estimated: 6 mL/min — ABNORMAL LOW (ref >60)
Glucose, Bld: 152 mg/dL — ABNORMAL HIGH (ref 70–99)
Potassium: 4 mmol/L (ref 3.5–5.1)
Sodium: 134 mmol/L — ABNORMAL LOW (ref 135–145)

## 2023-03-15 LAB — CBC WITH DIFFERENTIAL/PLATELET
Abs Immature Granulocytes: 0.02 10*3/uL (ref 0.00–0.07)
Basophils Absolute: 0 10*3/uL (ref 0.0–0.1)
Basophils Relative: 0 %
Eosinophils Absolute: 0.3 10*3/uL (ref 0.0–0.5)
Eosinophils Relative: 4 %
HCT: 36.9 % — ABNORMAL LOW (ref 39.0–52.0)
Hemoglobin: 10.6 g/dL — ABNORMAL LOW (ref 13.0–17.0)
Immature Granulocytes: 0 %
Lymphocytes Relative: 14 %
Lymphs Abs: 1 10*3/uL (ref 0.7–4.0)
MCH: 25.2 pg — ABNORMAL LOW (ref 26.0–34.0)
MCHC: 28.7 g/dL — ABNORMAL LOW (ref 30.0–36.0)
MCV: 87.9 fL (ref 80.0–100.0)
Monocytes Absolute: 0.9 10*3/uL (ref 0.1–1.0)
Monocytes Relative: 13 %
Neutro Abs: 4.9 10*3/uL (ref 1.7–7.7)
Neutrophils Relative %: 69 %
Platelets: 166 10*3/uL (ref 150–400)
RBC: 4.2 MIL/uL — ABNORMAL LOW (ref 4.22–5.81)
RDW: 21.1 % — ABNORMAL HIGH (ref 11.5–15.5)
WBC: 7.1 10*3/uL (ref 4.0–10.5)
nRBC: 0 % (ref 0.0–0.2)

## 2023-03-15 LAB — CBC
HCT: 33.3 % — ABNORMAL LOW (ref 39.0–52.0)
Hemoglobin: 10 g/dL — ABNORMAL LOW (ref 13.0–17.0)
MCH: 24.9 pg — ABNORMAL LOW (ref 26.0–34.0)
MCHC: 30 g/dL (ref 30.0–36.0)
MCV: 83 fL (ref 80.0–100.0)
Platelets: 388 10*3/uL (ref 150–400)
RBC: 4.01 MIL/uL — ABNORMAL LOW (ref 4.22–5.81)
RDW: 20.5 % — ABNORMAL HIGH (ref 11.5–15.5)
WBC: 8.1 10*3/uL (ref 4.0–10.5)
nRBC: 0 % (ref 0.0–0.2)

## 2023-03-15 LAB — CBG MONITORING, ED: Glucose-Capillary: 187 mg/dL — ABNORMAL HIGH (ref 70–99)

## 2023-03-15 MED ORDER — SODIUM CHLORIDE 0.9 % IV SOLN
2.0000 g | Freq: Once | INTRAVENOUS | Status: AC
  Administered 2023-03-16: 2 g via INTRAVENOUS
  Filled 2023-03-15: qty 10

## 2023-03-15 MED ORDER — VANCOMYCIN HCL 1750 MG/350ML IV SOLN
1750.0000 mg | Freq: Once | INTRAVENOUS | Status: AC
  Administered 2023-03-16: 1750 mg via INTRAVENOUS
  Filled 2023-03-15: qty 350

## 2023-03-15 NOTE — ED Provider Notes (Signed)
[facility]PT [facility] Emergency Department Provider Note MRN:  [id number]  Arrival date & time: 03/16/23     Chief Complaint   Pneumonia   History of Present Illness   [name] is a 52 y.o. year-old male presents to the ED with chief complaint of cough and SOB.  Recent diagnosis of pneumonia.  He states that he hasn't been able to get the antibiotic filled because of finances.  States that he missed his dialysis this morning.  States that he is feeling more SOB and fatigued.  History provided by patient.   Review of Systems  Pertinent positive and negative review of systems noted in HPI.    Physical Exam   Vitals:   03/15/23 2018 03/15/23 2254  BP: (!) 171/79   Pulse: 88   Resp: 18   Temp: 99.3 F (37.4 C)   SpO2: 91% 92%    CONSTITUTIONAL:  chronically ill-appearing, NAD NEURO:  Alert and oriented x 3, CN 3-12 grossly intact EYES:  eyes equal and reactive ENT/NECK:  Supple, no stridor  CARDIO:  normal rate, regular rhythm, appears well-perfused  PULM:  No respiratory distress, diminished GI/GU:  non-distended,  MSK/SPINE:  No gross deformities, no edema, moves all extremities  SKIN:  no rash, atraumatic   *Additional and/or pertinent findings included in MDM below  Diagnostic and Interventional Summary    EKG Interpretation Date/Time:    Ventricular Rate:    PR Interval:    QRS Duration:    QT Interval:    QTC Calculation:   R Axis:      Text Interpretation:         Labs Reviewed  COMPREHENSIVE METABOLIC PANEL - Abnormal; Notable for the following components:      Result Value   Chloride 97 (*)    Glucose, Bld 181 (*)    BUN 53 (*)    Creatinine, Ser 10.57 (*)    Calcium 7.7 (*)    Albumin 3.0 (*)    Alkaline Phosphatase 189 (*)    GFR, Estimated 5 (*)    Anion gap 17 (*)    All other components within normal limits  CBC - Abnormal; Notable for the following components:   RBC 4.01 (*)    Hemoglobin 10.0 (*)    HCT 33.3  (*)    MCH 24.9 (*)    RDW 20.5 (*)    All other components within normal limits  CULTURE, BLOOD (ROUTINE X 2)  CULTURE, BLOOD (ROUTINE X 2)    DG Chest 2 View  Final Result      Medications  aztreonam (AZACTAM) 2 g in sodium chloride 0.9 % 100 mL IVPB (has no administration in time range)  vancomycin (VANCOREADY) IVPB 1750 mg/350 mL (has no administration in time range)     Procedures  /  Critical Care Procedures  ED Course and Medical Decision Making  I have reviewed the triage vital signs, the nursing notes, and pertinent available records from the EMR.  Social Determinants Affecting Complexity of Care: Patient has no clinically significant social determinants affecting this chief complaint..   ED Course: Clinical Course as of 03/16/23 0003  Sun Mar 16, 2023  0003 DG Chest 2 View Bilateral airspace opacities [RB]  0003 Comprehensive metabolic panel(!) Potassium 4.2, missed dialysis today, but does not seem to need emergent dialysis tonight [RB]    Clinical Course User Index [RB] Roxy Horseman, PA-C    Medical Decision Making Patient here with cough and  shortness of breath.  Seen recently for the same.  Recent diagnosis of pneumonia.  He is also on dialysis and missed his dialysis today.  Last dialysis was on Thursday.  O2 saturation has been in the low 90s with the lowest of 90% at rest.  Looks like he does have worsening infiltrate on chest x-ray.  Given his worsening symptoms, the fact that he cannot get his outpatient antibiotics, and his comorbidities, feel that he would be best served by inpatient treatment.  Started IV antibiotics.  Amount and/or Complexity of Data Reviewed Labs: ordered. Radiology: ordered.  Risk Prescription drug management. Decision regarding hospitalization.         Consultants: I consulted with Hospitalist, Dr. Loney Loh, who is appreciated for admitting.   Treatment and Plan: Patient's exam and diagnostic results are  concerning for HCAP.  Feel that patient will need admission to the hospital for further treatment and evaluation.  Patient discussed with attending physician, Dr. Silverio Lay, who agrees with plan..  Final Clinical Impressions(s) / ED Diagnoses     ICD-10-CM   1. HCAP (healthcare-associated pneumonia)  J18.9       ED Discharge Orders     None         Discharge Instructions Discussed with and Provided to Patient:   Discharge Instructions   None      Roxy Horseman, PA-C 03/16/23 0003    Charlynne Pander, MD 03/16/23 [phone]

## 2023-03-15 NOTE — ED Notes (Signed)
Patient transported to X-ray 

## 2023-03-15 NOTE — ED Provider Triage Note (Signed)
Emergency Medicine Provider Triage Evaluation Note  [name] , a 52 y.o. male  was evaluated in triage.  Pt complains of cough.  Pt with ESRD here with cough.  Review of Systems  Positive: cough Negative: Unable to obtain  Physical Exam  BP (!) 170/85 (BP Location: Left Arm)   Pulse 84   Temp 99.2 F (37.3 C) (Oral)   Resp 18   SpO2 90%  Gen:   Awake, no distress   Resp:  Normal effort  MSK:   Moves extremities without difficulty  Other:    Medical Decision Making  Medically screening exam initiated at 4:04 AM.  Appropriate orders placed.  [name] was informed that the remainder of the evaluation will be completed by another provider, this initial triage assessment does not replace that evaluation, and the importance of remaining in the ED until their evaluation is complete.     Tilden Fossa, MD 03/15/23 [phone]

## 2023-03-15 NOTE — ED Triage Notes (Signed)
The pt reports that he has pneumonia he was daignosed with it  a couple of nights ago.  He frequents this ed

## 2023-03-15 NOTE — ED Triage Notes (Signed)
Pt was diagnosed w/ pneumonia @ [facility] yesterday & reports he has no money to pick his script up. Pt reports cough

## 2023-03-15 NOTE — ED Notes (Signed)
Patient refused xray, MD notified.

## 2023-03-15 NOTE — ED Notes (Signed)
Attempted to take patient's vitals but was shrugged off and patient said "no you cannot take it."

## 2023-03-15 NOTE — ED Notes (Signed)
Pt declined taking vitals.

## 2023-03-15 NOTE — ED Notes (Signed)
Called patient for room. Patient stated he is "good and will be leaving."

## 2023-03-15 NOTE — H&P (Incomplete)
History and Physical    [name] BJY:[id number] DOB: 06/11/1970 DOA: 03/15/2023  PCP: [name]  Chief Complaint: Cough, shortness of breath  HPI: [name] is a 52 y.o. male with medical history significant of ESRD on HD, chronic HFrEF, chronic respiratory failure on 2-3 L home oxygen, insulin-dependent type 2 diabetes, hypertension, GERD, anemia of chronic disease, noncompliance, homelessness presents to the ED with complaints of cough and shortness of breath.  He was seen in the ED yesterday and chest x-ray was concerning for pneumonia so he was discharged with prescriptions for Augmentin and doxycycline.  Patient has not been able to pick up antibiotics from the pharmacy due to financial problems so he returns to the ED tonight.  Also he missed his dialysis this morning.  Vital signs on arrival to ED: Temperature 99.3 F, pulse 88, respiratory rate 18, blood pressure 171/79, and SpO2 91% on room air.  Labs showing no leukocytosis, hemoglobin 10.0 (at baseline), MCV 83.0, potassium 4.2, bicarb 24, glucose 181, calcium 7.7, albumin 3.0  patient was given vancomycin and aztreonam.  TRH called to admit.    ED Course: ***  Review of Systems:  ROS  Past Medical History:  Diagnosis Date  . Anxiety 02/2019  . Anxiety 10/2019  . Diabetes mellitus type II, uncontrolled 07/17/2006  . Elevated alkaline phosphatase level 01/2019  . ESRD on hemodialysis (HCC)    tts HD  . ESSENTIAL HYPERTENSION 07/17/2006  . Homelessness 10/13/2021  . HYPERLIPIDEMIA 11/24/2008  . Insomnia 02/2019  . Left bundle branch block 02/22/2019  . Lesion of lip 10/2019  . Pneumonia 10/22/2011  . Suicidal ideations 02/2019  . Vitamin D deficiency 01/2019    Past Surgical History:  Procedure Laterality Date  . ARM SURGERY    . AV FISTULA PLACEMENT Right 02/20/2021   Procedure: RIGHT ARTERIOVENOUS GRAFT CREATION;  Surgeon: Chuck Hint, MD;  Location: [facility] OR;  Service: Vascular;   Laterality: Right;  . INSERTION OF DIALYSIS CATHETER Left 02/20/2021   Procedure: INSERTION OF DIALYSIS CATHETER USING PALINDROME CATHETER;  Surgeon: Chuck Hint, MD;  Location: [facility] OR;  Service: Vascular;  Laterality: Left;  . IR AV DIALY SHUNT INTRO NEEDLE/INTRACATH INITIAL W/PTA/IMG RIGHT Right 11/06/2021  . IR FLUORO GUIDE CV LINE RIGHT  09/12/2022  . IR REMOVAL TUN CV CATH W/O FL  07/27/2021  . IR THROMBECTOMY AV FISTULA W/THROMBOLYSIS INC/SHUNT/IMG RIGHT  09/11/2022  . IR US GUIDE VASC ACCESS RIGHT  11/06/2021  . IR US GUIDE VASC ACCESS RIGHT  09/11/2022  . IR US GUIDE VASC ACCESS RIGHT  09/12/2022  . TEE WITHOUT CARDIOVERSION N/A 10/25/2022   Procedure: TRANSESOPHAGEAL ECHOCARDIOGRAM;  Surgeon: Chilton Si, MD;  Location: [facility] INVASIVE CV LAB;  Service: Cardiovascular;  Laterality: N/A;  . ULTRASOUND GUIDANCE FOR VASCULAR ACCESS  02/20/2021   Procedure: ULTRASOUND GUIDANCE FOR VASCULAR ACCESS;  Surgeon: Chuck Hint, MD;  Location: [facility] OR;  Service: Vascular;;     reports that he has never smoked. He has never used smokeless tobacco. He reports that he does not drink alcohol and does not use drugs.  Allergies  Allergen Reactions  . Sulfa Antibiotics Rash    Severe rash.  Severe rash.     Severe rash.  . Other Other (See Comments)    Patient states he is allergic to the TB test - unknown reaction during childhood   . Bactrim [Sulfamethoxazole-Trimethoprim] Rash  . Cephalexin Other (See Comments) and Rash    Patient with severe  drug reaction including exfoliating skin rash and hypotension after being prescribed both cephalexin and sulfamethoxazole-trimethoprim simultaneously. Favor SMX as much more likely culprit as patient had tolerated other cephalosporins in the past, but cannot say with complete certainty that this was not related to cephalexin.  Other Reaction(s): Other, Other (See Comments)  Patient with severe drug reaction including exfoliating skin rash and  hypotension after being prescribed both cephalexin and sulfamethoxazole-trimethoprim simultaneously. Favor SMX as much more likely culprit as patient had tolerated other cephalosporins in the past, but cannot say with complete certainty that this was not related to cephalexin.   "Patient with severe drug reaction including exfoliating skin rash and hypotension after being prescribed both cephalexin and sulfamethoxazole-trimethoprim simultaneously. Favor SMX as much more likely culprit as patient had tolerated other cephalosporins in the past, but cannot say with complete certainty that this was not related to cephalexin." As noted in [facility]   "Patient with severe drug reaction including exfoliating skin rash and hypotension after being prescribed both cephalexin and sulfamethoxazole-trimethoprim simultaneously. Favor SMX as much more likely culprit as patient had tolerated other cephalosporins in the past, but cannot say with complete certainty that this was not related to cephalexin." As noted in [facility]   "Patient with severe drug reaction including exfoliating skin rash and hypotension after being prescribed both cephalexin and sulfamethoxazole-trimethoprim simultaneously. Favor SMX as much more likely culprit as patient had tolerated other cephalosporins in the past, but cannot say with complete certainty that this was not related to cephalexin." As noted in [facility]     Patient with severe drug reaction including exfoliating skin rash and hypotension after being prescribed both cephalexin and sulfamethoxazole-trimethoprim simultaneously. Favor SMX as much more likely culprit as patient had tolerated other cephalosporins in the past, but cannot say with complete certainty that this was not related to cephalexin.  "Patient with severe drug reaction including exfoliating skin rash and hypotension after being prescribed both cephalexin and sulfamethoxazole-trimethoprim simultaneously. Favor SMX as  much more likely culprit as patient had tolerated other cephalosporins in the past, but cannot say with complete certainty that this was not related to cephalexin." As noted in [facility]  "Patient with severe drug reaction including exfoliating skin rash and hypotension after being prescribed both cephalexin and sulfamethoxazole-trimethoprim simultaneously. Favor SMX as much more likely culprit as patient had tolerated other cephalosporins in the past, but cannot say with complete certainty that this was not related to cephalexin." As noted in [facility]     Patient with severe drug reaction inc... (TRUNCATED)    Family History  Problem Relation Age of Onset  . Hypertension Mother 88  . Cancer Mother        bladder cancer  . Alcohol abuse Father   . Hypertension Brother     Prior to Admission medications   Medication Sig Start Date End Date Taking? Authorizing Provider  acetaminophen (TYLENOL) 500 MG tablet Take 1 tablet (500 mg total) by mouth every 8 (eight) hours as needed for mild pain. 05/28/22   Leroy Sea, MD  amoxicillin-clavulanate (AUGMENTIN) 250-125 MG tablet Take 1 tablet by mouth daily. 03/14/23   Zadie Rhine, MD  ASPIRIN LOW DOSE 81 MG tablet Take 1 tablet (81 mg total) by mouth daily. 05/28/22   Leroy Sea, MD  calcitRIOL (ROCALTROL) 0.25 MCG capsule Take 1 capsule (0.25 mcg total) by mouth every Tuesday, Thursday, and Saturday at 6 PM. 10/26/22   Ghimire, Werner Lean, MD  calcium acetate (  PHOSLO) 667 MG tablet Take 3 tablets (2,001 mg total) by mouth 3 (three) times daily. 12/12/22     calcium acetate (PHOSLO) 667 MG tablet Take 3 tablets (2,001 mg total) by mouth 3 (three) times daily. 03/06/23     carvedilol (COREG) 12.5 MG tablet Take 1 tablet by mouth twice a day with meals 03/06/23     doxycycline (VIBRAMYCIN) 100 MG capsule Take 1 capsule (100 mg total) by mouth 2 (two) times daily. One po bid x 7 days 03/14/23   Zadie Rhine, MD  glucose blood test  strip Use to check fasting blood sugar once daily. 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 10/26/22   [provider]  hydrALAZINE (APRESOLINE) 25 MG tablet Take 1 tablet (25 mg total) by mouth 3 (three) times daily. 02/15/23   Zannie Cove, MD  insulin aspart (NOVOLOG FLEXPEN) 100 UNIT/ML FlexPen 0-9 Units, Subcutaneous, 3 times daily with meals CBG < 70: Implement Hypoglycemia measures CBG 70 - 120: 0 units CBG 121 - 150: 1 unit CBG 151 - 200: 2 units CBG 201 - 250: 3 units CBG 251 - 300: 5 units CBG 301 - 350: 7 units CBG 351 - 400: 9 units CBG > 400: call MD 10/26/22   Maretta Bees, MD  insulin glargine-yfgn (SEMGLEE) 100 UNIT/ML injection Inject 0.07 mLs (7 Units total) into the skin at bedtime. 10/26/22   Ghimire, Werner Lean, MD  Insulin Pen Needle 32G X 4 MM MISC Use with insulin pens 05/28/22   Leroy Sea, MD  isosorbide mononitrate (IMDUR) 60 MG 24 hr tablet Take 1 tablet (60 mg total) by mouth daily. 10/26/22   Ghimire, Werner Lean, MD  melatonin 3 MG TABS tablet Take 1 tablet (3 mg total) by mouth at bedtime as needed (insomnia). 10/26/22   Ghimire, Werner Lean, MD  mupirocin ointment (BACTROBAN) 2 % Apply 1 Application topically 3 (three) times daily. 01/07/23   Mardella Layman, MD  ondansetron (ZOFRAN-ODT) 4 MG disintegrating tablet Take 1 tablet (4 mg total) by mouth every 4 (four) hours as needed for nausea/vomiting. 03/05/23   Melene Plan, DO  PROTONIX 40 MG tablet Take 1 tablet (40 mg total) by mouth daily. 05/25/22   Leroy Sea, MD  simethicone (MYLICON) 80 MG chewable tablet Chew 1 tablet (80 mg total) by mouth 4 (four) times daily for 5 days. 11/22/22 12/17/22  Rodolph Bong, MD  gabapentin (NEURONTIN) 300 MG capsule Take 1 capsule (300 mg total) by mouth 3 (three) times daily. Patient not taking: Reported on 07/27/2019 07/29/18 08/30/19  Kallie Locks, FNP  sildenafil (VIAGRA) 25 MG  tablet Take 1 tablet (25 mg total) by mouth as needed for erectile dysfunction. for erectile dysfunction. Patient not taking: Reported on 08/11/2020 10/13/19 11/27/20  Kallie Locks, FNP    Physical Exam: Vitals:   03/15/23 2018 03/15/23 2018 03/15/23 2254  BP:  (!) 171/79   Pulse:  88   Resp:  18   Temp:  99.3 F (37.4 C)   TempSrc:  Oral   SpO2:  91% 92%  Weight: 81 kg    Height: 5\' 7"  (1.702 m)      Physical Exam  Labs on Admission: I have personally reviewed following labs and imaging studies  CBC: Recent Labs  Lab 03/09/23 0700 03/09/23 2240 03/11/23 0036 03/11/23 1900 03/13/23 2236 03/15/23 0430 03/15/23 2030  WBC 8.5   < >  9.2 7.9 8.2 7.1 8.1  NEUTROABS 6.7  --   --  5.2  --  4.9  --   HGB 10.1*   < > 10.3* 9.9* 12.7* 10.6* 10.0*  HCT 34.7*   < > 33.8* 33.8* 42.4 36.9* 33.3*  MCV 83.8   < > 82.2 84.7 83.0 87.9 83.0  PLT 300   < > 324 312 400 166 388   < > = values in this interval not displayed.   Basic Metabolic Panel: Recent Labs  Lab 03/11/23 0036 03/11/23 1900 03/13/23 2236 03/15/23 0430 03/15/23 2030  NA 141 135 135 134* 138  K 3.8 4.1 3.8 4.0 4.2  CL 100 101 96* 101 97*  CO2 22 20* 21* 18* 24  GLUCOSE 133* 136* 125* 152* 181*  BUN 59* 59* 40* 52* 53*  CREATININE 10.98* 10.94* 8.42* 9.79* 10.57*  CALCIUM 8.0* 7.0* 8.0* 7.5* 7.7*   GFR: Estimated Creatinine Clearance: 8.6 mL/min (A) (by C-G formula based on SCr of 10.57 mg/dL (H)). Liver Function Tests: Recent Labs  Lab 03/09/23 0700 03/11/23 0036 03/11/23 1900 03/15/23 2030  AST 21 18 22 20   ALT 13 12 13 9   ALKPHOS 176* 180* 166* 189*  BILITOT 0.7 0.5 0.5 0.5  PROT 7.1 7.5 7.0 8.1  ALBUMIN 2.8* 3.0* 2.8* 3.0*   Recent Labs  Lab 03/11/23 0036  LIPASE 31   No results for input(s): "AMMONIA" in the last 168 hours. Coagulation Profile: No results for input(s): "INR", "PROTIME" in the last 168 hours. Cardiac Enzymes: No results for input(s): "CKTOTAL", "CKMB", "CKMBINDEX",  "TROPONINI" in the last 168 hours. BNP (last 3 results) No results for input(s): "PROBNP" in the last 8760 hours. HbA1C: No results for input(s): "HGBA1C" in the last 72 hours. CBG: Recent Labs  Lab 03/15/23 0443  GLUCAP 187*   Lipid Profile: No results for input(s): "CHOL", "HDL", "LDLCALC", "TRIG", "CHOLHDL", "LDLDIRECT" in the last 72 hours. Thyroid Function Tests: No results for input(s): "TSH", "T4TOTAL", "FREET4", "T3FREE", "THYROIDAB" in the last 72 hours. Anemia Panel: No results for input(s): "VITAMINB12", "FOLATE", "FERRITIN", "TIBC", "IRON", "RETICCTPCT" in the last 72 hours. Urine analysis:    Component Value Date/Time   COLORURINE YELLOW 02/19/2021 0045   APPEARANCEUR HAZY (A) 02/19/2021 0045   APPEARANCEUR Clear 05/09/2020 1509   LABSPEC 1.013 02/19/2021 0045   PHURINE 5.0 02/19/2021 0045   GLUCOSEU >=500 (A) 02/19/2021 0045   HGBUR MODERATE (A) 02/19/2021 0045   BILIRUBINUR NEGATIVE 02/19/2021 0045   BILIRUBINUR Negative 05/09/2020 1509   KETONESUR NEGATIVE 02/19/2021 0045   PROTEINUR >=300 (A) 02/19/2021 0045   UROBILINOGEN 0.2 02/04/2020 1612   UROBILINOGEN 0.2 02/16/2014 0045   NITRITE NEGATIVE 02/19/2021 0045   LEUKOCYTESUR NEGATIVE 02/19/2021 0045    Radiological Exams on Admission: DG Chest 2 View  Result Date: 03/15/2023 CLINICAL DATA:  Shortness of breath EXAM: CHEST - 2 VIEW COMPARISON:  03/13/2023 FINDINGS: Similar cardiomegaly. Increased patchy bilateral airspace and interstitial opacities greatest in the left mid lung. No pleural effusion or pneumothorax. Elevated right hemidiaphragm. Old right rib fractures. Chronic Nonunited left clavicle fracture. IMPRESSION: Bilateral airspace and interstitial opacities greatest in the left mid lung have increased from 03/13/2023. Findings may be due to worsening pneumonia or pulmonary edema. Follow-up to resolution is recommended. Electronically Signed   By: Minerva Fester M.D.   On: 03/15/2023 22:50    EKG:  Independently reviewed. ***  Assessment and Plan  Last echo done in June 2024 showing EF 20 to 25%, severe  LVH, severely reduced RV  DVT prophylaxis: {Blank single:19197::"Lovenox","SQ Heparin","IV heparin gtt","Xarelto","Eliquis","Coumadin","SCDs","***"} Code Status: {Blank single:19197::"Full Code","DNR","DNR/DNI","Comfort Care","***"} Family Communication: ***  Consults called: ***  Level of care: {Blank single:19197::"Med-Surg","Telemetry bed","Progressive Care Unit","Step Down Unit"} Admission status: *** Time Spent: 75+ minutes***  John Giovanni MD [facility]  If 7PM-7AM, please contact night-coverage [email or web address]  03/15/2023, 11:51 PM

## 2023-03-15 NOTE — Progress Notes (Signed)
ED Pharmacy Antibiotic Sign Off An antibiotic consult was received from an ED provider for vancomycin per pharmacy dosing for PNA. A chart review was completed to assess appropriateness.   The following one time order(s) were placed:  Vancomycin 1750mg  IV x1  Further antibiotic and/or antibiotic pharmacy consults should be ordered by the admitting provider if indicated.   Thank you for allowing pharmacy to be a part of this patient's care.   Vernard Gambles, PharmD, BCPS   Clinical Pharmacist 03/15/23 11:17 PM

## 2023-03-15 NOTE — H&P (Signed)
History and Physical    [name] ZHY:[id number] DOB: 12/15/1970 DOA: 03/15/2023  PCP: [name]  Chief Complaint: Cough, shortness of breath  HPI: [name] is a 52 y.o. male with medical history significant of ESRD on HD TTS, chronic HFrEF, chronic respiratory failure on 2-3 L home oxygen, insulin-dependent type 2 diabetes, hypertension, hyperlipidemia, GERD, anemia of chronic disease, noncompliance, homelessness presents to the ED with complaints of cough and shortness of breath.  Recently seen in the ED and chest x-ray was concerning for pneumonia and he was discharged with prescriptions for Augmentin and doxycycline.  Patient has not been able to pick up antibiotics from the pharmacy due to financial problems so he returns to the ED tonight.  Also he missed his dialysis this morning.  Vital signs on arrival to ED: Temperature 99.3 F, pulse 88, respiratory rate 18, blood pressure 171/79, and SpO2 91% on room air.  Labs showing no leukocytosis, hemoglobin 10.0 (at baseline), MCV 83.0, potassium 4.2, bicarb 24, glucose 181, calcium 7.7, albumin 3.0, blood cultures pending.  Chest x-ray showing bilateral airspace and interstitial opacities, greatest in the left midlung, increased from 03/13/2023.  Findings concerning for worsening pneumonia or pulmonary edema. Patient was given vancomycin and aztreonam.  TRH called to admit.  History very limited as patient did not seem interested in engaging in conversation.  Tells me he was diagnosed with pneumonia 2 days ago and did not answer any other questions.  Review of Systems:  Review of Systems  Reason unable to perform ROS: Patient refuses to answer questions.    Past Medical History:  Diagnosis Date   Anxiety 02/2019   Anxiety 10/2019   Diabetes mellitus type II, uncontrolled 07/17/2006   Elevated alkaline phosphatase level 01/2019   ESRD on hemodialysis (HCC)    tts HD   ESSENTIAL HYPERTENSION 07/17/2006   Homelessness  10/13/2021   HYPERLIPIDEMIA 11/24/2008   Insomnia 02/2019   Left bundle branch block 02/22/2019   Lesion of lip 10/2019   Pneumonia 10/22/2011   Suicidal ideations 02/2019   Vitamin D deficiency 01/2019    Past Surgical History:  Procedure Laterality Date   ARM SURGERY     AV FISTULA PLACEMENT Right 02/20/2021   Procedure: RIGHT ARTERIOVENOUS GRAFT CREATION;  Surgeon: Chuck Hint, MD;  Location: [facility] OR;  Service: Vascular;  Laterality: Right;   INSERTION OF DIALYSIS CATHETER Left 02/20/2021   Procedure: INSERTION OF DIALYSIS CATHETER USING PALINDROME CATHETER;  Surgeon: Chuck Hint, MD;  Location: [facility] OR;  Service: Vascular;  Laterality: Left;   IR AV DIALY SHUNT INTRO NEEDLE/INTRACATH INITIAL W/PTA/IMG RIGHT Right 11/06/2021   IR FLUORO GUIDE CV LINE RIGHT  09/12/2022   IR REMOVAL TUN CV CATH W/O FL  07/27/2021   IR THROMBECTOMY AV FISTULA W/THROMBOLYSIS INC/SHUNT/IMG RIGHT  09/11/2022   IR US GUIDE VASC ACCESS RIGHT  11/06/2021   IR US GUIDE VASC ACCESS RIGHT  09/11/2022   IR US GUIDE VASC ACCESS RIGHT  09/12/2022   TEE WITHOUT CARDIOVERSION N/A 10/25/2022   Procedure: TRANSESOPHAGEAL ECHOCARDIOGRAM;  Surgeon: Chilton Si, MD;  Location: [facility] INVASIVE CV LAB;  Service: Cardiovascular;  Laterality: N/A;   ULTRASOUND GUIDANCE FOR VASCULAR ACCESS  02/20/2021   Procedure: ULTRASOUND GUIDANCE FOR VASCULAR ACCESS;  Surgeon: Chuck Hint, MD;  Location: [facility] OR;  Service: Vascular;;     reports that he has never smoked. He has never used smokeless tobacco. He reports that he does not drink alcohol and does not  use drugs.  Allergies  Allergen Reactions   Sulfa Antibiotics Rash    Severe rash.  Severe rash.     Severe rash.   Other Other (See Comments)    Patient states he is allergic to the TB test - unknown reaction during childhood    Bactrim [Sulfamethoxazole-Trimethoprim] Rash   Cephalexin Other (See Comments) and Rash    Patient with severe drug reaction  including exfoliating skin rash and hypotension after being prescribed both cephalexin and sulfamethoxazole-trimethoprim simultaneously. Favor SMX as much more likely culprit as patient had tolerated other cephalosporins in the past, but cannot say with complete certainty that this was not related to cephalexin.  Other Reaction(s): Other, Other (See Comments)  Patient with severe drug reaction including exfoliating skin rash and hypotension after being prescribed both cephalexin and sulfamethoxazole-trimethoprim simultaneously. Favor SMX as much more likely culprit as patient had tolerated other cephalosporins in the past, but cannot say with complete certainty that this was not related to cephalexin.   "Patient with severe drug reaction including exfoliating skin rash and hypotension after being prescribed both cephalexin and sulfamethoxazole-trimethoprim simultaneously. Favor SMX as much more likely culprit as patient had tolerated other cephalosporins in the past, but cannot say with complete certainty that this was not related to cephalexin." As noted in [facility]   "Patient with severe drug reaction including exfoliating skin rash and hypotension after being prescribed both cephalexin and sulfamethoxazole-trimethoprim simultaneously. Favor SMX as much more likely culprit as patient had tolerated other cephalosporins in the past, but cannot say with complete certainty that this was not related to cephalexin." As noted in [facility]   "Patient with severe drug reaction including exfoliating skin rash and hypotension after being prescribed both cephalexin and sulfamethoxazole-trimethoprim simultaneously. Favor SMX as much more likely culprit as patient had tolerated other cephalosporins in the past, but cannot say with complete certainty that this was not related to cephalexin." As noted in [facility]     Patient with severe drug reaction including exfoliating skin rash and hypotension after being  prescribed both cephalexin and sulfamethoxazole-trimethoprim simultaneously. Favor SMX as much more likely culprit as patient had tolerated other cephalosporins in the past, but cannot say with complete certainty that this was not related to cephalexin.  "Patient with severe drug reaction including exfoliating skin rash and hypotension after being prescribed both cephalexin and sulfamethoxazole-trimethoprim simultaneously. Favor SMX as much more likely culprit as patient had tolerated other cephalosporins in the past, but cannot say with complete certainty that this was not related to cephalexin." As noted in [facility]  "Patient with severe drug reaction including exfoliating skin rash and hypotension after being prescribed both cephalexin and sulfamethoxazole-trimethoprim simultaneously. Favor SMX as much more likely culprit as patient had tolerated other cephalosporins in the past, but cannot say with complete certainty that this was not related to cephalexin." As noted in [facility]     Patient with severe drug reaction inc... (TRUNCATED)    Family History  Problem Relation Age of Onset   Hypertension Mother 48   Cancer Mother        bladder cancer   Alcohol abuse Father    Hypertension Brother     Prior to Admission medications   Medication Sig Start Date End Date Taking? Authorizing Provider  acetaminophen (TYLENOL) 500 MG tablet Take 1 tablet (500 mg total) by mouth every 8 (eight) hours as needed for mild pain. 05/28/22   Leroy Sea, MD  amoxicillin-clavulanate (AUGMENTIN) 250-125  MG tablet Take 1 tablet by mouth daily. 03/14/23   Zadie Rhine, MD  ASPIRIN LOW DOSE 81 MG tablet Take 1 tablet (81 mg total) by mouth daily. 05/28/22   Leroy Sea, MD  calcitRIOL (ROCALTROL) 0.25 MCG capsule Take 1 capsule (0.25 mcg total) by mouth every Tuesday, Thursday, and Saturday at 6 PM. 10/26/22   Ghimire, Werner Lean, MD  calcium acetate (PHOSLO) 667 MG tablet Take 3 tablets (2,001 mg  total) by mouth 3 (three) times daily. 12/12/22     calcium acetate (PHOSLO) 667 MG tablet Take 3 tablets (2,001 mg total) by mouth 3 (three) times daily. 03/06/23     carvedilol (COREG) 12.5 MG tablet Take 1 tablet by mouth twice a day with meals 03/06/23     doxycycline (VIBRAMYCIN) 100 MG capsule Take 1 capsule (100 mg total) by mouth 2 (two) times daily. One po bid x 7 days 03/14/23   Zadie Rhine, MD  glucose blood test strip Use to check fasting blood sugar once daily. 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 10/26/22   [provider]  hydrALAZINE (APRESOLINE) 25 MG tablet Take 1 tablet (25 mg total) by mouth 3 (three) times daily. 02/15/23   Zannie Cove, MD  insulin aspart (NOVOLOG FLEXPEN) 100 UNIT/ML FlexPen 0-9 Units, Subcutaneous, 3 times daily with meals CBG < 70: Implement Hypoglycemia measures CBG 70 - 120: 0 units CBG 121 - 150: 1 unit CBG 151 - 200: 2 units CBG 201 - 250: 3 units CBG 251 - 300: 5 units CBG 301 - 350: 7 units CBG 351 - 400: 9 units CBG > 400: call MD 10/26/22   Maretta Bees, MD  insulin glargine-yfgn (SEMGLEE) 100 UNIT/ML injection Inject 0.07 mLs (7 Units total) into the skin at bedtime. 10/26/22   Ghimire, Werner Lean, MD  Insulin Pen Needle 32G X 4 MM MISC Use with insulin pens 05/28/22   Leroy Sea, MD  isosorbide mononitrate (IMDUR) 60 MG 24 hr tablet Take 1 tablet (60 mg total) by mouth daily. 10/26/22   Ghimire, Werner Lean, MD  melatonin 3 MG TABS tablet Take 1 tablet (3 mg total) by mouth at bedtime as needed (insomnia). 10/26/22   Ghimire, Werner Lean, MD  mupirocin ointment (BACTROBAN) 2 % Apply 1 Application topically 3 (three) times daily. 01/07/23   Mardella Layman, MD  ondansetron (ZOFRAN-ODT) 4 MG disintegrating tablet Take 1 tablet (4 mg total) by mouth every 4 (four) hours as needed for nausea/vomiting. 03/05/23   Melene Plan, DO  PROTONIX 40 MG tablet Take 1  tablet (40 mg total) by mouth daily. 05/25/22   Leroy Sea, MD  simethicone (MYLICON) 80 MG chewable tablet Chew 1 tablet (80 mg total) by mouth 4 (four) times daily for 5 days. 11/22/22 12/17/22  Rodolph Bong, MD  gabapentin (NEURONTIN) 300 MG capsule Take 1 capsule (300 mg total) by mouth 3 (three) times daily. Patient not taking: Reported on 07/27/2019 07/29/18 08/30/19  Kallie Locks, FNP  sildenafil (VIAGRA) 25 MG tablet Take 1 tablet (25 mg total) by mouth as needed for erectile dysfunction. for erectile dysfunction. Patient not taking: Reported on 08/11/2020 10/13/19 11/27/20  Kallie Locks, FNP    Physical Exam: Vitals:   03/15/23 2018 03/15/23 2018 03/15/23 2254  BP:  (!) 171/79   Pulse:  88   Resp:  18   Temp:  99.3  F (37.4 C)   TempSrc:  Oral   SpO2:  91% 92%  Weight: 81 kg    Height: 5\' 7"  (1.702 m)      Physical Exam Vitals reviewed.  Constitutional:      General: He is not in acute distress. HENT:     Head: Normocephalic and atraumatic.  Eyes:     Extraocular Movements: Extraocular movements intact.  Cardiovascular:     Rate and Rhythm: Normal rate and regular rhythm.     Pulses: Normal pulses.  Pulmonary:     Effort: Pulmonary effort is normal. No respiratory distress.     Breath sounds: Rales present. No wheezing.  Abdominal:     General: Bowel sounds are normal.     Palpations: Abdomen is soft.     Tenderness: There is no abdominal tenderness. There is no guarding.  Musculoskeletal:     Cervical back: Normal range of motion.     Right lower leg: Edema present.     Left lower leg: Edema present.     Comments: 2+ bilateral pedal edema  Skin:    General: Skin is warm and dry.  Neurological:     General: No focal deficit present.     Mental Status: He is alert and oriented to person, place, and time.     Cranial Nerves: No cranial nerve deficit.     Sensory: No sensory deficit.     Motor: No weakness.     Labs on Admission: I have  personally reviewed following labs and imaging studies  CBC: Recent Labs  Lab 03/09/23 0700 03/09/23 2240 03/11/23 0036 03/11/23 1900 03/13/23 2236 03/15/23 0430 03/15/23 2030  WBC 8.5   < > 9.2 7.9 8.2 7.1 8.1  NEUTROABS 6.7  --   --  5.2  --  4.9  --   HGB 10.1*   < > 10.3* 9.9* 12.7* 10.6* 10.0*  HCT 34.7*   < > 33.8* 33.8* 42.4 36.9* 33.3*  MCV 83.8   < > 82.2 84.7 83.0 87.9 83.0  PLT 300   < > 324 312 400 166 388   < > = values in this interval not displayed.   Basic Metabolic Panel: Recent Labs  Lab 03/11/23 0036 03/11/23 1900 03/13/23 2236 03/15/23 0430 03/15/23 2030  NA 141 135 135 134* 138  K 3.8 4.1 3.8 4.0 4.2  CL 100 101 96* 101 97*  CO2 22 20* 21* 18* 24  GLUCOSE 133* 136* 125* 152* 181*  BUN 59* 59* 40* 52* 53*  CREATININE 10.98* 10.94* 8.42* 9.79* 10.57*  CALCIUM 8.0* 7.0* 8.0* 7.5* 7.7*   GFR: Estimated Creatinine Clearance: 8.6 mL/min (A) (by C-G formula based on SCr of 10.57 mg/dL (H)). Liver Function Tests: Recent Labs  Lab 03/09/23 0700 03/11/23 0036 03/11/23 1900 03/15/23 2030  AST 21 18 22 20   ALT 13 12 13 9   ALKPHOS 176* 180* 166* 189*  BILITOT 0.7 0.5 0.5 0.5  PROT 7.1 7.5 7.0 8.1  ALBUMIN 2.8* 3.0* 2.8* 3.0*   Recent Labs  Lab 03/11/23 0036  LIPASE 31   No results for input(s): "AMMONIA" in the last 168 hours. Coagulation Profile: No results for input(s): "INR", "PROTIME" in the last 168 hours. Cardiac Enzymes: No results for input(s): "CKTOTAL", "CKMB", "CKMBINDEX", "TROPONINI" in the last 168 hours. BNP (last 3 results) No results for input(s): "PROBNP" in the last 8760 hours. HbA1C: No results for input(s): "HGBA1C" in the last 72 hours. CBG: Recent Labs  Lab  03/15/23 0443  GLUCAP 187*   Lipid Profile: No results for input(s): "CHOL", "HDL", "LDLCALC", "TRIG", "CHOLHDL", "LDLDIRECT" in the last 72 hours. Thyroid Function Tests: No results for input(s): "TSH", "T4TOTAL", "FREET4", "T3FREE", "THYROIDAB" in the last  72 hours. Anemia Panel: No results for input(s): "VITAMINB12", "FOLATE", "FERRITIN", "TIBC", "IRON", "RETICCTPCT" in the last 72 hours. Urine analysis:    Component Value Date/Time   COLORURINE YELLOW 02/19/2021 0045   APPEARANCEUR HAZY (A) 02/19/2021 0045   APPEARANCEUR Clear 05/09/2020 1509   LABSPEC 1.013 02/19/2021 0045   PHURINE 5.0 02/19/2021 0045   GLUCOSEU >=500 (A) 02/19/2021 0045   HGBUR MODERATE (A) 02/19/2021 0045   BILIRUBINUR NEGATIVE 02/19/2021 0045   BILIRUBINUR Negative 05/09/2020 1509   KETONESUR NEGATIVE 02/19/2021 0045   PROTEINUR >=300 (A) 02/19/2021 0045   UROBILINOGEN 0.2 02/04/2020 1612   UROBILINOGEN 0.2 02/16/2014 0045   NITRITE NEGATIVE 02/19/2021 0045   LEUKOCYTESUR NEGATIVE 02/19/2021 0045    Radiological Exams on Admission: DG Chest 2 View  Result Date: 03/15/2023 CLINICAL DATA:  Shortness of breath EXAM: CHEST - 2 VIEW COMPARISON:  03/13/2023 FINDINGS: Similar cardiomegaly. Increased patchy bilateral airspace and interstitial opacities greatest in the left mid lung. No pleural effusion or pneumothorax. Elevated right hemidiaphragm. Old right rib fractures. Chronic Nonunited left clavicle fracture. IMPRESSION: Bilateral airspace and interstitial opacities greatest in the left mid lung have increased from 03/13/2023. Findings may be due to worsening pneumonia or pulmonary edema. Follow-up to resolution is recommended. Electronically Signed   By: Minerva Fester M.D.   On: 03/15/2023 22:50    EKG: Independently reviewed.  Sinus rhythm, LBBB, QTc 552.  No significant change compared to previous EKGs.  Assessment and Plan  Bilateral pneumonia versus pulmonary edema Acute hypoxemic respiratory failure Patient presenting with complaints of cough and shortness of breath. Chest x-ray showing bilateral airspace and interstitial opacities, greatest in the left midlung, increased from 03/13/2023.  Findings concerning for worsening pneumonia or pulmonary edema.   Reportedly patient missed his last dialysis treatment.  No fever or leukocytosis.  No signs of sepsis.  Informed by ED RN that patient's oxygen saturation was in the low 90s on room air when awake and did go down into the 80s when asleep.  He is currently on 2 L [place] and stable.  Consult nephrology in the morning for dialysis.  Patient was given vancomycin and aztreonam in the ED.  Check procalcitonin level, continue antibiotics if elevated.  MRSA PCR screen.  SARS-CoV-2 PCR and respiratory viral panel ordered.  Blood cultures pending.  Continue supplemental oxygen.  ESRD on HD TTS Patient reportedly missed his last dialysis treatment and chest x-ray concerning for possible pulmonary edema.  Does appear volume overloaded with bilateral pedal edema.  Currently stable on 2 L [place], no respiratory distress.  Potassium and bicarb within normal range.  Consult nephrology in the morning for dialysis.  Acute on chronic HFrEF Last echo done in June 2024 showing EF 20 to 25%, severe LVH, severely reduced RV systolic function.  Volume management with dialysis, consult nephrology in the morning.  QT prolongation Monitor potassium and magnesium levels.  Avoid QT prolonging drugs.  Insulin-dependent type 2 diabetes Glucose 181.  Dose of home basal insulin unknown at this time and patient refuses to speak.  Pharmacy medication reconciliation pending.  Ordered very sensitive sliding scale insulin ACHS.  Anemia of chronic disease Hemoglobin stable and no signs of bleeding.  Hypertension: SBP currently in the 150s. Hyperlipidemia GERD Unable to safely order  home medications at this time as pharmacy medication reconciliation is pending.  DVT prophylaxis: SQ Heparin Code Status: Full code by default.  Unable to discuss CODE STATUS with the patient at this time as he refuses to answer any questions.  Per chart review, he has been listed as full code during previous hospitalizations. Level of care: Telemetry  bed Admission status: It is my clinical opinion that referral for OBSERVATION is reasonable and necessary in this patient based on the above information provided. The aforementioned taken together are felt to place the patient at high risk for further clinical deterioration. However, it is anticipated that the patient may be medically stable for discharge from the hospital within 24 to 48 hours.  John Giovanni MD [facility]  If 7PM-7AM, please contact night-coverage [email or web address]  03/15/2023, 11:51 PM

## 2023-03-16 DIAGNOSIS — J189 Pneumonia, unspecified organism: Secondary | ICD-10-CM | POA: Diagnosis not present

## 2023-03-16 LAB — GLUCOSE, CAPILLARY
Glucose-Capillary: 121 mg/dL — ABNORMAL HIGH (ref 70–99)
Glucose-Capillary: 157 mg/dL — ABNORMAL HIGH (ref 70–99)
Glucose-Capillary: 114 mg/dL — ABNORMAL HIGH (ref 70–99)

## 2023-03-16 LAB — PROCALCITONIN: Procalcitonin: 0.31 ng/mL

## 2023-03-16 LAB — MAGNESIUM: Magnesium: 2.3 mg/dL (ref 1.7–2.4)

## 2023-03-16 LAB — RESPIRATORY PANEL BY PCR

## 2023-03-16 LAB — SARS CORONAVIRUS 2 BY RT PCR: SARS Coronavirus 2 by RT PCR: NEGATIVE

## 2023-03-16 MED ORDER — ACETAMINOPHEN 325 MG PO TABS
650.0000 mg | ORAL_TABLET | Freq: Four times a day (QID) | ORAL | Status: DC | PRN
Start: 2023-03-16 — End: 2023-03-17

## 2023-03-16 MED ORDER — CHLORHEXIDINE GLUCONATE CLOTH 2 % EX PADS: 6.0000 | MEDICATED_PAD | Freq: Every day | CUTANEOUS | Status: DC

## 2023-03-16 MED ORDER — INSULIN ASPART 100 UNIT/ML IJ SOLN
0.0000 [IU] | Freq: Every day | INTRAMUSCULAR | Status: DC
Start: 2023-03-16 — End: 2023-03-17

## 2023-03-16 MED ORDER — HEPARIN SODIUM (PORCINE) 5000 UNIT/ML IJ SOLN
5000.0000 [IU] | Freq: Three times a day (TID) | INTRAMUSCULAR | Status: DC
  Filled 2023-03-16: qty 1

## 2023-03-16 MED ORDER — GUAIFENESIN-DM 100-10 MG/5ML PO SYRP
5.0000 mL | ORAL_SOLUTION | ORAL | Status: DC | PRN
  Administered 2023-03-16: 5 mL via ORAL
  Filled 2023-03-16: qty 5

## 2023-03-16 MED ORDER — PROSOURCE PLUS PO LIQD: 30.0000 mL | Freq: Two times a day (BID) | ORAL | Status: DC

## 2023-03-16 MED ORDER — INSULIN ASPART 100 UNIT/ML IJ SOLN
0.0000 [IU] | Freq: Three times a day (TID) | INTRAMUSCULAR | Status: DC
Start: 2023-03-16 — End: 2023-03-17

## 2023-03-16 MED ORDER — ACETAMINOPHEN 650 MG RE SUPP: 650.0000 mg | Freq: Four times a day (QID) | RECTAL | Status: DC | PRN

## 2023-03-16 MED ORDER — DARBEPOETIN ALFA 100 MCG/0.5ML IJ SOSY: 100.0000 ug | PREFILLED_SYRINGE | INTRAMUSCULAR | Status: DC

## 2023-03-16 NOTE — ED Notes (Signed)
Admitting provider at bedside.

## 2023-03-16 NOTE — Progress Notes (Signed)
Patient refused lab draw.

## 2023-03-16 NOTE — Consult Note (Signed)
[facility] Renal Consultation Note    Indication for Consultation:  Management of ESRD/hemodialysis, anemia, hypertension/volume, and secondary hyperparathyroidism. PCP:  HPI: [name] is a 52 y.o. male with ESRD, T2DM, HTN, HL who was admitted OBS with dyspnea, possible pneumonia.  Pt not interested in having full discussion today, wants to eat his breakfast. Per note, presented to ED last night with c/o cough and pneumonia. Had been in the ED on 10/24 - CXR was concerning for possible pneumonia. He was prescribed PO Augmentin and doxycycline - reports that was unable to pick up and start those meds. In ED, he was afebrile, BP was high and he was dyspneic requiring O2. Labs with Na 138, K 4.2, CO2 24, Pro-calcitonin 0.31, WBC 8.1, Hgb 10, Plts 388. COVID/viral panel negative. CXR with worsened appearance of pulm edema.  Seen in room - sitting on side of bed and arranging his cereal. On room air at this time, appears very comfortable.   Dialyzes on TTS schedule at [facility] - very non-compliant. Last HD was 10/24 which he completed in full, 6L removed, but still left 8.1kg above dry weight. Uses R AVF - denies recent issues.  Past Medical History:  Diagnosis Date   Anxiety 02/2019   Anxiety 10/2019   Diabetes mellitus type II, uncontrolled 07/17/2006   Elevated alkaline phosphatase level 01/2019   ESRD on hemodialysis (HCC)    tts HD   ESSENTIAL HYPERTENSION 07/17/2006   Homelessness 10/13/2021   HYPERLIPIDEMIA 11/24/2008   Insomnia 02/2019   Left bundle branch block 02/22/2019   Lesion of lip 10/2019   Pneumonia 10/22/2011   Suicidal ideations 02/2019   Vitamin D deficiency 01/2019   Past Surgical History:  Procedure Laterality Date   ARM SURGERY     AV FISTULA PLACEMENT Right 02/20/2021   Procedure: RIGHT ARTERIOVENOUS GRAFT CREATION;  Surgeon: Chuck Hint, MD;  Location: [facility] OR;  Service: Vascular;  Laterality: Right;   INSERTION OF DIALYSIS CATHETER  Left 02/20/2021   Procedure: INSERTION OF DIALYSIS CATHETER USING PALINDROME CATHETER;  Surgeon: Chuck Hint, MD;  Location: [facility] OR;  Service: Vascular;  Laterality: Left;   IR AV DIALY SHUNT INTRO NEEDLE/INTRACATH INITIAL W/PTA/IMG RIGHT Right 11/06/2021   IR FLUORO GUIDE CV LINE RIGHT  09/12/2022   IR REMOVAL TUN CV CATH W/O FL  07/27/2021   IR THROMBECTOMY AV FISTULA W/THROMBOLYSIS INC/SHUNT/IMG RIGHT  09/11/2022   IR US GUIDE VASC ACCESS RIGHT  11/06/2021   IR US GUIDE VASC ACCESS RIGHT  09/11/2022   IR US GUIDE VASC ACCESS RIGHT  09/12/2022   TEE WITHOUT CARDIOVERSION N/A 10/25/2022   Procedure: TRANSESOPHAGEAL ECHOCARDIOGRAM;  Surgeon: Chilton Si, MD;  Location: [facility] INVASIVE CV LAB;  Service: Cardiovascular;  Laterality: N/A;   ULTRASOUND GUIDANCE FOR VASCULAR ACCESS  02/20/2021   Procedure: ULTRASOUND GUIDANCE FOR VASCULAR ACCESS;  Surgeon: Chuck Hint, MD;  Location: [facility] OR;  Service: Vascular;;   Family History  Problem Relation Age of Onset   Hypertension Mother 65   Cancer Mother        bladder cancer   Alcohol abuse Father    Hypertension Brother    Social History:  reports that he has never smoked. He has never used smokeless tobacco. He reports that he does not drink alcohol and does not use drugs.  ROS: As per HPI otherwise negative. Unable to get full ROS.  Physical Exam: Vitals:   03/16/23 0036 03/16/23 0037 03/16/23 0110 03/16/23 0900  BP:  Marland Kitchen)  151/79 (!) 162/86 (!) 153/82  Pulse:  84 92 80  Resp:   16 18  Temp: 99 F (37.2 C)  98.6 F (37 C) 98.1 F (36.7 C)  TempSrc: Oral  Oral Oral  SpO2:  99% 92% 100%  Weight:      Height:         General: Chronically ill appearing man, NAD. Room air. Head: Normocephalic, atraumatic. Neck: Supple without lymphadenopathy/masses.  Lungs: Clear bilaterally to auscultation without wheezes, rales, or rhonchi. Breathing is unlabored. Heart: RRR with normal S1, S2. No murmurs, rubs, or gallops  appreciated. Abdomen: Soft, non-tender, non-distended with normoactive bowel sounds.  Musculoskeletal:  Strength and tone appear normal for age. Lower extremities: 1-2+ BLE edema Neuro: Alert and oriented X 3. Moves all extremities spontaneously. Dialysis Access: R AVG + bruit  Allergies  Allergen Reactions   Sulfa Antibiotics Rash    Severe rash.  Severe rash.     Severe rash.   Other Other (See Comments)    Patient states he is allergic to the TB test - unknown reaction during childhood    Bactrim [Sulfamethoxazole-Trimethoprim] Rash   Cephalexin Other (See Comments) and Rash    Patient with severe drug reaction including exfoliating skin rash and hypotension after being prescribed both cephalexin and sulfamethoxazole-trimethoprim simultaneously. Favor SMX as much more likely culprit as patient had tolerated other cephalosporins in the past, but cannot say with complete certainty that this was not related to cephalexin.  Other Reaction(s): Other, Other (See Comments)  Patient with severe drug reaction including exfoliating skin rash and hypotension after being prescribed both cephalexin and sulfamethoxazole-trimethoprim simultaneously. Favor SMX as much more likely culprit as patient had tolerated other cephalosporins in the past, but cannot say with complete certainty that this was not related to cephalexin.   "Patient with severe drug reaction including exfoliating skin rash and hypotension after being prescribed both cephalexin and sulfamethoxazole-trimethoprim simultaneously. Favor SMX as much more likely culprit as patient had tolerated other cephalosporins in the past, but cannot say with complete certainty that this was not related to cephalexin." As noted in [facility]   "Patient with severe drug reaction including exfoliating skin rash and hypotension after being prescribed both cephalexin and sulfamethoxazole-trimethoprim simultaneously. Favor SMX as much more likely culprit as  patient had tolerated other cephalosporins in the past, but cannot say with complete certainty that this was not related to cephalexin." As noted in [facility]   "Patient with severe drug reaction including exfoliating skin rash and hypotension after being prescribed both cephalexin and sulfamethoxazole-trimethoprim simultaneously. Favor SMX as much more likely culprit as patient had tolerated other cephalosporins in the past, but cannot say with complete certainty that this was not related to cephalexin." As noted in [facility]     Patient with severe drug reaction including exfoliating skin rash and hypotension after being prescribed both cephalexin and sulfamethoxazole-trimethoprim simultaneously. Favor SMX as much more likely culprit as patient had tolerated other cephalosporins in the past, but cannot say with complete certainty that this was not related to cephalexin.  "Patient with severe drug reaction including exfoliating skin rash and hypotension after being prescribed both cephalexin and sulfamethoxazole-trimethoprim simultaneously. Favor SMX as much more likely culprit as patient had tolerated other cephalosporins in the past, but cannot say with complete certainty that this was not related to cephalexin." As noted in [facility]  "Patient with severe drug reaction including exfoliating skin rash and hypotension after being prescribed both cephalexin and sulfamethoxazole-trimethoprim  simultaneously. Favor SMX as much more likely culprit as patient had tolerated other cephalosporins in the past, but cannot say with complete certainty that this was not related to cephalexin." As noted in [facility]     Patient with severe drug reaction inc... (TRUNCATED)   Prior to Admission medications   Medication Sig Start Date End Date Taking? Authorizing Provider  acetaminophen (TYLENOL) 500 MG tablet Take 1 tablet (500 mg total) by mouth every 8 (eight) hours as needed for mild pain. 05/28/22   Leroy Sea, MD  amoxicillin-clavulanate (AUGMENTIN) 250-125 MG tablet Take 1 tablet by mouth daily. 03/14/23   Zadie Rhine, MD  ASPIRIN LOW DOSE 81 MG tablet Take 1 tablet (81 mg total) by mouth daily. 05/28/22   Leroy Sea, MD  calcitRIOL (ROCALTROL) 0.25 MCG capsule Take 1 capsule (0.25 mcg total) by mouth every Tuesday, Thursday, and Saturday at 6 PM. 10/26/22   Ghimire, Werner Lean, MD  calcium acetate (PHOSLO) 667 MG tablet Take 3 tablets (2,001 mg total) by mouth 3 (three) times daily. 12/12/22     calcium acetate (PHOSLO) 667 MG tablet Take 3 tablets (2,001 mg total) by mouth 3 (three) times daily. 03/06/23     carvedilol (COREG) 12.5 MG tablet Take 1 tablet by mouth twice a day with meals 03/06/23     doxycycline (VIBRAMYCIN) 100 MG capsule Take 1 capsule (100 mg total) by mouth 2 (two) times daily. One po bid x 7 days 03/14/23   Zadie Rhine, MD  glucose blood test strip Use to check fasting blood sugar once daily. 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 10/26/22   [provider]  hydrALAZINE (APRESOLINE) 25 MG tablet Take 1 tablet (25 mg total) by mouth 3 (three) times daily. 02/15/23   Zannie Cove, MD  insulin aspart (NOVOLOG FLEXPEN) 100 UNIT/ML FlexPen 0-9 Units, Subcutaneous, 3 times daily with meals CBG < 70: Implement Hypoglycemia measures CBG 70 - 120: 0 units CBG 121 - 150: 1 unit CBG 151 - 200: 2 units CBG 201 - 250: 3 units CBG 251 - 300: 5 units CBG 301 - 350: 7 units CBG 351 - 400: 9 units CBG > 400: call MD 10/26/22   Maretta Bees, MD  insulin glargine-yfgn (SEMGLEE) 100 UNIT/ML injection Inject 0.07 mLs (7 Units total) into the skin at bedtime. 10/26/22   Ghimire, Werner Lean, MD  Insulin Pen Needle 32G X 4 MM MISC Use with insulin pens 05/28/22   Leroy Sea, MD  isosorbide mononitrate (IMDUR) 60 MG 24 hr tablet Take 1 tablet (60 mg total) by mouth daily. 10/26/22    Ghimire, Werner Lean, MD  melatonin 3 MG TABS tablet Take 1 tablet (3 mg total) by mouth at bedtime as needed (insomnia). 10/26/22   Ghimire, Werner Lean, MD  mupirocin ointment (BACTROBAN) 2 % Apply 1 Application topically 3 (three) times daily. 01/07/23   Mardella Layman, MD  ondansetron (ZOFRAN-ODT) 4 MG disintegrating tablet Take 1 tablet (4 mg total) by mouth every 4 (four) hours as needed for nausea/vomiting. 03/05/23   Melene Plan, DO  PROTONIX 40 MG tablet Take 1 tablet (40 mg total) by mouth daily. 05/25/22   Leroy Sea, MD  simethicone (MYLICON) 80 MG chewable tablet Chew 1 tablet (80 mg total) by mouth 4 (four) times daily for 5 days. 11/22/22 12/17/22  Rodolph Bong, MD  gabapentin (NEURONTIN)  300 MG capsule Take 1 capsule (300 mg total) by mouth 3 (three) times daily. Patient not taking: Reported on 07/27/2019 07/29/18 08/30/19  Kallie Locks, FNP  sildenafil (VIAGRA) 25 MG tablet Take 1 tablet (25 mg total) by mouth as needed for erectile dysfunction. for erectile dysfunction. Patient not taking: Reported on 08/11/2020 10/13/19 11/27/20  Kallie Locks, FNP   Current Facility-Administered Medications  Medication Dose Route Frequency Provider Last Rate Last Admin   acetaminophen (TYLENOL) tablet 650 mg  650 mg Oral Q6H PRN John Giovanni, MD       Or   acetaminophen (TYLENOL) suppository 650 mg  650 mg Rectal Q6H PRN John Giovanni, MD       guaiFENesin-dextromethorphan (ROBITUSSIN DM) 100-10 MG/5ML syrup 5 mL  5 mL Oral Q4H PRN John Giovanni, MD   5 mL at 03/16/23 0200   heparin injection 5,000 Units  5,000 Units Subcutaneous Q8H John Giovanni, MD       insulin aspart (novoLOG) injection 0-5 Units  0-5 Units Subcutaneous QHS John Giovanni, MD       insulin aspart (novoLOG) injection 0-6 Units  0-6 Units Subcutaneous TID WC John Giovanni, MD       Labs: Basic Metabolic Panel: Recent Labs  Lab 03/13/23 2236 03/15/23 0430 03/15/23 2030  NA 135 134* 138   K 3.8 4.0 4.2  CL 96* 101 97*  CO2 21* 18* 24  GLUCOSE 125* 152* 181*  BUN 40* 52* 53*  CREATININE 8.42* 9.79* 10.57*  CALCIUM 8.0* 7.5* 7.7*   Liver Function Tests: Recent Labs  Lab 03/11/23 0036 03/11/23 1900 03/15/23 2030  AST 18 22 20   ALT 12 13 9   ALKPHOS 180* 166* 189*  BILITOT 0.5 0.5 0.5  PROT 7.5 7.0 8.1  ALBUMIN 3.0* 2.8* 3.0*   Recent Labs  Lab 03/11/23 0036  LIPASE 31   CBC: Recent Labs  Lab 03/11/23 0036 03/11/23 1900 03/13/23 2236 03/15/23 0430 03/15/23 2030  WBC 9.2 7.9 8.2 7.1 8.1  NEUTROABS  --  5.2  --  4.9  --   HGB 10.3* 9.9* 12.7* 10.6* 10.0*  HCT 33.8* 33.8* 42.4 36.9* 33.3*  MCV 82.2 84.7 83.0 87.9 83.0  PLT 324 312 400 166 388   Studies/Results: DG Chest 2 View  Result Date: 03/15/2023 CLINICAL DATA:  Shortness of breath EXAM: CHEST - 2 VIEW COMPARISON:  03/13/2023 FINDINGS: Similar cardiomegaly. Increased patchy bilateral airspace and interstitial opacities greatest in the left mid lung. No pleural effusion or pneumothorax. Elevated right hemidiaphragm. Old right rib fractures. Chronic Nonunited left clavicle fracture. IMPRESSION: Bilateral airspace and interstitial opacities greatest in the left mid lung have increased from 03/13/2023. Findings may be due to worsening pneumonia or pulmonary edema. Follow-up to resolution is recommended. Electronically Signed   By: Minerva Fester M.D.   On: 03/15/2023 22:50    Dialysis Orders:  TTS [facility] 4hr, 400/800, EDW 61.5kg, 2K/2.5Ca bath, AVG, heparin 2000 unit bolus - Mircera IV q 2 weeks (last given 10/12) - Venofer 100mg  IV weekly - last 10/24 - Calcitriol 0.26mcg PO q HD - binders: Phoslo 3/meals   Assessment/Plan:  AHRF/pulm edema/possible pneumonia: Chronically overload in setting of poor HD compliance. CXR looks rough, but he looks fine on exam - not using O2. Inpatient HD not typically done on Sunday unless emergent, will plan on HD tomorrow AM unless anything changes today. Given  Vanc + Aztreonam yesterday.  ESRD:  Usual TTS schedule - for HD tomorrow AM. Large UFG  as tolerated.  Hypertension/volume: Chronically overloaded, pulm edema on CXR. UF as tolerated.  Anemia: Hgb 10 - due for ESA, will order to be given today.  Metabolic bone disease: CorrCa ok, Phos pending. Continue home binders/VDRA.  Nutrition:  Alb low, adding supps.  T2DM: Per primary.  Non-compliance/homelessness: Many confounding social issues.  Ozzie Hoyle, PA-C 03/16/2023, 10:51 AM  [employer]

## 2023-03-17 ENCOUNTER — Other Ambulatory Visit: Payer: Self-pay

## 2023-03-17 ENCOUNTER — Other Ambulatory Visit (HOSPITAL_COMMUNITY): Payer: Self-pay

## 2023-03-17 DIAGNOSIS — J189 Pneumonia, unspecified organism: Secondary | ICD-10-CM | POA: Diagnosis not present

## 2023-03-17 LAB — GLUCOSE, CAPILLARY: Glucose-Capillary: 184 mg/dL — ABNORMAL HIGH (ref 70–99)

## 2023-03-17 MED ORDER — DOXYCYCLINE HYCLATE 100 MG PO TABS
100.0000 mg | ORAL_TABLET | Freq: Two times a day (BID) | ORAL | Status: DC
  Administered 2023-03-17: 100 mg via ORAL
  Filled 2023-03-17: qty 1

## 2023-03-17 MED ORDER — SODIUM CHLORIDE 0.9 % IV SOLN
500.0000 mg | INTRAVENOUS | Status: DC
  Filled 2023-03-17: qty 5

## 2023-03-17 MED ORDER — DOXYCYCLINE HYCLATE 100 MG PO TABS
100.0000 mg | ORAL_TABLET | Freq: Two times a day (BID) | ORAL | 0 refills | Status: DC
  Filled 2023-03-17: qty 14, 7d supply, fill #0

## 2023-03-17 MED ORDER — SODIUM CHLORIDE 0.9 % IV SOLN: 2.0000 g | Freq: Once | INTRAVENOUS | Status: DC

## 2023-03-17 NOTE — Assessment & Plan Note (Signed)
Patient's glucoses will be managed by SSI and FSBS

## 2023-03-17 NOTE — Progress Notes (Signed)
Progress Note   Patient: [name] MWU:[id number] DOB: [birth year] DOA: 03/15/2023     0 DOS: the patient was seen and examined on 03/17/2023   Brief hospital course: The patient is a 52 yr old man who presented to the ED complaining of cough and shortness of breath. He had been seen in the ED recently and had been discharged with a prescription for antibiotics (augentin and azithromycin) out of concern for pneumonia. which he did not pick up for financial reasons. The patient also missed dialysis this am. CXR demonstrated bilateral airspace disease concerning for worsening pneumonia or pulmonary edema.   The patient has not had a fever. He does not have a white cound, and his procalcinonin is negative. He was admitted to a telemetry bed. He has received azactam 2 gm IV. Nephrology is aware of his admission and has been consulted. They do no feel that dialysis is urgent, so they plan to take him for dialysis on 03/17/2023.  He has been continued n azactam and azithromycin.  Assessment and Plan: CAP (community acquired pneumonia) The patient presents with cough, dyspnea, and evidence of bilaeral air space disease on CXR. However, he has no fever, no WBC count, and procalcitonin of 0.3. Azactam has been continued out of caution while we await dialysis on 03/17/2023.   Acute hypoxemic respiratory failure (HCC) Likely to be due to pulmonary edema, although CXR does show bilateral airspace disease. Nephrology plans to take the patient to HD on 10/28. Azactam has been continued in the meantime. Monitor for WBC, fevers.  ESRD on dialysis [facility]) Nephrology consulted. HD planned for 03/17/2023.  Acute on chronic combined systolic and diastolic CHF (congestive heart failure) (HCC) Patient will go to dialysis on 03/17/2023.  HTN (hypertension) Due to hypervolemia. Pt to go to dialysis on 03/17/2023.  Anemia of chronic disease Due to dialysis. Monitor hemoglobin  DM2 (diabetes mellitus, type  2) (HCC) Patient's glucoses will be managed by SSI and FSBS        Subjective: The patient is lying in bed quietly. He has no new complaints, but is refusing labs.  Physical Exam: Vitals:   03/16/23 0037 03/16/23 0110 03/16/23 0900 03/16/23 1732  BP: (!) 151/79 (!) 162/86 (!) 153/82 (!) 154/84  Pulse: 84 92 80 79  Resp:  16 18 20   Temp:  98.6 F (37 C) 98.1 F (36.7 C) 97.7 F (36.5 C)  TempSrc:  Oral Oral   SpO2: 99% 92% 100% 97%  Weight:      Height:       Exam:  Constitutional:  The patient is awake, alert, and oriented x 3. No acute distress. Respiratory:  No increased work of breathing. No wheezes, rales, or rhonchi No tactile fremitus Cardiovascular:  Regular rate and rhythm No murmurs, ectopy, or gallups. No lateral PMI. No thrills. Abdomen:  Abdomen is soft, non-tender, non-distended No hernias, masses, or organomegaly Normoactive bowel sounds.  Musculoskeletal:  No cyanosis, clubbing, or edema Skin:  No rashes, lesions, ulcers palpation of skin: no induration or nodules Neurologic:  CN 2-12 intact Sensation all 4 extremities intact Psychiatric:  Mental status Mood, affect appropriate Orientation to person, place, time  judgment and insight appear intact  Data Reviewed:  Patient is refusing lab draws.  Family Communication: None available  Disposition Status is: Observation The patient remains OBS appropriate and will d/c before 2 midnights.  Planned Discharge Destination: Home    Time spent: 32 minutes  Author: Ruddy Swire, DO 03/16/2023 6:20 PM  For on call review [email or web address].

## 2023-03-17 NOTE — Discharge Summary (Signed)
Physician Discharge Summary  [name]:[id number] DOB: 1970/08/27 DOA: 03/15/2023  PCP: [name]  Admit date: 03/15/2023 Discharge date: 03/17/2023  Admitted From: home Disposition:  home  Recommendations for Outpatient Follow-up:  Follow up with PCP in 1-2 weeks Follow up with outpatient dialysis tomorrow  Home Health: none Equipment/Devices: none  Discharge Condition: stable CODE STATUS: Full code Diet Orders (From admission, onward)     Start     Ordered   03/16/23 1634  Diet regular Room service appropriate? Yes; Fluid consistency: Thin  Diet effective now       Question Answer Comment  Room service appropriate? Yes   Fluid consistency: Thin      03/16/23 1633           Brief Narrative / Interim history: 52 yr old man who presented to the ED complaining of cough and shortness of breath. He had been seen in the ED recently and had been discharged with a prescription for antibiotics (augentin and azithromycin) out of concern for pneumonia. which he did not pick up for financial reasons. The patient also missed dialysis this am. CXR demonstrated bilateral airspace disease concerning for worsening pneumonia or pulmonary edema.   Hospital Course / Discharge diagnoses: Principal Problem:   CAP (community acquired pneumonia) Active Problems:   ESRD on dialysis (HCC)   Acute hypoxemic respiratory failure (HCC)   Acute on chronic combined systolic and diastolic CHF (congestive heart failure) (HCC)   HTN (hypertension)   DM2 (diabetes mellitus, type 2) (HCC)   Anemia of chronic disease   Principal problem CAP (community acquired pneumonia) - The patient presents with cough, dyspnea, and evidence of bilaeral air space disease on CXR. However, he has no fever, no WBC count, and procalcitonin of 0.3.  He has been placed on IV antibiotics, however he has refused further IV antibiotics and self removed his IV and no longer wants IV access.  He is on room air,  clinically appears stable, will be placed on doxycycline and discharged home in stable condition   Active problems Acute hypoxemic respiratory failure (HCC) -on my evaluation he is on room air, this is resolved.   ESRD on dialysis [facility]) - Nephrology consulted, he has refused to undergo dialysis this morning.  Given the fact that he is on room air, afebrile, with a normal white count, he is stable to go home and have his outpatient HD as scheduled tomorrow.  Discussed with nephrology and they are in agreement  Acute on chronic combined systolic and diastolic CHF (congestive heart failure) (HCC) -clinically appears euvolemic on my evaluation HTN (hypertension) -resume home medications on discharge Anemia of chronic disease -no bleeding.  Refused labs DM2, poorly controlled, with hyperglycemia -continue home medications    Sepsis ruled out   Discharge Instructions   Allergies as of 03/17/2023       Reactions   Sulfa Antibiotics Rash   Severe rash. Severe rash.     Severe rash.   Other Other (See Comments)   Patient states he is allergic to the TB test - unknown reaction during childhood    Bactrim [sulfamethoxazole-trimethoprim] Rash   Cephalexin Other (See Comments), Rash   Patient with severe drug reaction including exfoliating skin rash and hypotension after being prescribed both cephalexin and sulfamethoxazole-trimethoprim simultaneously. Favor SMX as much more likely culprit as patient had tolerated other cephalosporins in the past, but cannot say with complete certainty that this was not related to cephalexin. Other Reaction(s): Other, Other (  See Comments) Patient with severe drug reaction including exfoliating skin rash and hypotension after being prescribed both cephalexin and sulfamethoxazole-trimethoprim simultaneously. Favor SMX as much more likely culprit as patient had tolerated other cephalosporins in the past, but cannot say with complete certainty that this was not related  to cephalexin.   "Patient with severe drug reaction including exfoliating skin rash and hypotension after being prescribed both cephalexin and sulfamethoxazole-trimethoprim simultaneously. Favor SMX as much more likely culprit as patient had tolerated other cephalosporins in the past, but cannot say with complete certainty that this was not related to cephalexin." As noted in [facility]   "Patient with severe drug reaction including exfoliating skin rash and hypotension after being prescribed both cephalexin and sulfamethoxazole-trimethoprim simultaneously. Favor SMX as much more likely culprit as patient had tolerated other cephalosporins in the past, but cannot say with complete certainty that this was not related to cephalexin." As noted in [facility]  "Patient with severe drug reaction including exfoliating skin rash and hypotension after being prescribed both cephalexin and sulfamethoxazole-trimethoprim simultaneously. Favor SMX as much more likely culprit as patient had tolerated other cephalosporins in the past, but cannot say with complete certainty that this was not related to cephalexin." As noted in [facility]     Patient with severe drug reaction including exfoliating skin rash and hypotension after being prescribed both cephalexin and sulfamethoxazole-trimethoprim simultaneously. Favor SMX as much more likely culprit as patient had tolerated other cephalosporins in the past, but cannot say with complete certainty that this was not related to cephalexin.  "Patient with severe drug reaction including exfoliating skin rash and hypotension after being prescribed both cephalexin and sulfamethoxazole-trimethoprim simultaneously. Favor SMX as much more likely culprit as patient had tolerated other cephalosporins in the past, but cannot say with complete certainty that this was not related to cephalexin." As noted in [facility]  "Patient with severe drug reaction including exfoliating skin rash and  hypotension after being prescribed both cephalexin and sulfamethoxazole-trimethoprim simultaneously. Favor SMX as much more likely culprit as patient had tolerated other cephalosporins in the past, but cannot say with complete certainty that this was not related to cephalexin." As noted in [facility]     Patient with severe drug reaction inc... (TRUNCATED)        Medication List     STOP taking these medications    doxycycline 100 MG capsule Commonly known as: VIBRAMYCIN Replaced by: doxycycline 100 MG tablet       TAKE these medications    Accu-Chek Guide test strip Generic drug: glucose blood Use to check fasting blood sugar once daily. 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   amoxicillin-clavulanate 250-125 MG tablet Commonly known as: AUGMENTIN Take 1 tablet by mouth daily.   Aspirin Low Dose 81 MG tablet Generic drug: aspirin EC Take 1 tablet (81 mg total) by mouth daily.   calcitRIOL 0.25 MCG capsule Commonly known as: ROCALTROL Take 1 capsule (0.25 mcg total) by mouth every Tuesday, Thursday, and Saturday at 6 PM.   calcium acetate 667 MG tablet Commonly known as: PHOSLO Take 3 tablets (2,001 mg total) by mouth 3 (three) times daily. What changed: Another medication with the same name was removed. Continue taking this medication, and follow the directions you see here.   carvedilol 12.5 MG tablet Commonly known as: COREG Take 1 tablet by  mouth twice a day with meals   doxycycline 100 MG tablet Commonly known as: VIBRA-TABS Take 1 tablet (100 mg total) by mouth 2 (two) times daily. Replaces: doxycycline 100 MG capsule   HumaLOG KwikPen 100 UNIT/ML KwikPen Generic drug: insulin lispro Inject into the skin.   hydrALAZINE 25 MG tablet Commonly known as: APRESOLINE Take 1 tablet (25 mg total) by mouth 3 (three) times daily.    insulin glargine-yfgn 100 UNIT/ML injection Commonly known as: SEMGLEE Inject 0.07 mLs (7 Units total) into the skin at bedtime.   isosorbide mononitrate 60 MG 24 hr tablet Commonly known as: IMDUR Take 1 tablet (60 mg total) by mouth daily.   melatonin 3 MG Tabs tablet Take 1 tablet (3 mg total) by mouth at bedtime as needed (insomnia).   mupirocin ointment 2 % Commonly known as: BACTROBAN Apply 1 Application topically 3 (three) times daily.   NovoLOG FlexPen 100 UNIT/ML FlexPen Generic drug: insulin aspart 0-9 Units, Subcutaneous, 3 times daily with meals CBG < 70: Implement Hypoglycemia measures CBG 70 - 120: 0 units CBG 121 - 150: 1 unit CBG 151 - 200: 2 units CBG 201 - 250: 3 units CBG 251 - 300: 5 units CBG 301 - 350: 7 units CBG 351 - 400: 9 units CBG > 400: call MD   ondansetron 4 MG disintegrating tablet Commonly known as: ZOFRAN-ODT Take 1 tablet (4 mg total) by mouth every 4 (four) hours as needed for nausea/vomiting.   Protonix 40 MG tablet Generic drug: pantoprazole Take 1 tablet (40 mg total) by mouth daily.   simethicone 80 MG chewable tablet Commonly known as: MYLICON Chew 1 tablet (80 mg total) by mouth 4 (four) times daily for 5 days.   TechLite Pen Needles 32G X 4 MM Misc Generic drug: Insulin Pen Needle Use with insulin pens        Consultations: Nephrology   Procedures/Studies:  DG Chest 2 View  Result Date: 03/15/2023 CLINICAL DATA:  Shortness of breath EXAM: CHEST - 2 VIEW COMPARISON:  03/13/2023 FINDINGS: Similar cardiomegaly. Increased patchy bilateral airspace and interstitial opacities greatest in the left mid lung. No pleural effusion or pneumothorax. Elevated right hemidiaphragm. Old right rib fractures. Chronic Nonunited left clavicle fracture. IMPRESSION: Bilateral airspace and interstitial opacities greatest in the left mid lung have increased from 03/13/2023. Findings may be due to worsening pneumonia or pulmonary edema. Follow-up to  resolution is recommended. Electronically Signed   By: Minerva Fester M.D.   On: 03/15/2023 22:50   DG Chest 2 View  Result Date: 03/13/2023 CLINICAL DATA:  Chest pain EXAM: CHEST - 2 VIEW COMPARISON:  03/09/2023 FINDINGS: Diffuse cardiac enlargement. No vascular congestion. Nodular opacities in the left mid lung and right lower lung, similar to prior study. No significant pulmonary nodules were demonstrated on chest CT from 12/20/2022 suggesting that this is likely infectious. Consider multifocal pneumonia. Follow-up to resolution is recommended to exclude underlying focal lesions. No pleural effusions. No pneumothorax. Old right rib fractures. IMPRESSION: 1. Cardiac enlargement. 2. Nodular opacities in the left mid lung and right lower lung similar to prior study, most likely multifocal pneumonia. Follow-up to resolution is recommended to exclude underlying focal lesions. Electronically Signed   By: Burman Nieves M.D.   On: 03/13/2023 22:48   DG Chest Portable 1 View  Result Date: 03/09/2023 CLINICAL DATA:  53 year old male under evaluation for fluid. EXAM: PORTABLE CHEST 1 VIEW COMPARISON:  Chest x-ray 02/23/2023. FINDINGS: Patchy areas of interstitial prominence, peribronchial cuffing and  ill-defined nodular densities are noted in the lungs bilaterally, most evident in the left mid lung and right lung base. No pleural effusions. Cephalization of the pulmonary vasculature, without frank pulmonary edema. Cardiopericardial silhouette is moderately enlarged, similar to the prior study. Upper mediastinal contours are within normal limits. Old healed fracture of the distal left clavicle with probable pseudoarthrosis. IMPRESSION: 1. The appearance of the chest suggests bronchitis with developing multilobar bilateral bronchopneumonia, most evident in the mid to lower lungs, as above. 2. Cardiomegaly with pulmonary venous congestion, but no frank pulmonary edema. Electronically Signed   By: Trudie Reed  M.D.   On: 03/09/2023 07:02   DG Chest 1 View  Result Date: 02/23/2023 CLINICAL DATA:  Shortness of breath EXAM: CHEST  1 VIEW COMPARISON:  02/04/2023 and older FINDINGS: Enlarged cardiopericardial silhouette with some prominence of the central vasculature. No consolidation, pneumothorax or effusion. No edema. IMPRESSION: Enlarged cardiac silhouette with central vascular congestion. Similar to previous Electronically Signed   By: Karen Kays M.D.   On: 02/23/2023 11:12     Subjective: - no chest pain, shortness of breath, no abdominal pain, nausea or vomiting.   Discharge Exam: BP (!) 149/74 (BP Location: Left Arm)   Pulse 76   Temp 98.6 F (37 C)   Resp 20   Ht 5\' 7"  (1.702 m)   Wt 81 kg   SpO2 93%   BMI 27.97 kg/m   General: Pt is alert, awake, not in acute distress Cardiovascular: RRR, S1/S2 +, no rubs, no gallops Respiratory: CTA bilaterally, no wheezing, no rhonchi Abdominal: Soft, NT, ND, bowel sounds + Extremities: no edema, no cyanosis    The results of significant diagnostics from this hospitalization (including imaging, microbiology, ancillary and laboratory) are listed below for reference.     Microbiology: Recent Results (from the past 240 hour(s))  Blood culture (routine x 2)     Status: None (Preliminary result)   Collection Time: 03/15/23 11:55 PM   Specimen: BLOOD LEFT HAND  Result Value Ref Range Status   Specimen Description BLOOD LEFT HAND  Final   Special Requests   Final    BOTTLES DRAWN AEROBIC AND ANAEROBIC Blood Culture adequate volume   Culture   Final    NO GROWTH 1 DAY Performed at [facility], [address], [place], [place] [address]    Report Status PENDING  Incomplete  Blood culture (routine x 2)     Status: None (Preliminary result)   Collection Time: 03/15/23 11:55 PM   Specimen: BLOOD LEFT FOREARM  Result Value Ref Range Status   Specimen Description BLOOD LEFT FOREARM  Final   Special Requests   Final    BOTTLES DRAWN  AEROBIC AND ANAEROBIC Blood Culture adequate volume   Culture   Final    NO GROWTH 1 DAY Performed at [facility], [address], [place], [place] [address]    Report Status PENDING  Incomplete  SARS Coronavirus 2 by RT PCR (hospital order, performed in [facility] hospital lab) *cepheid single result test* Nasopharyngeal Swab     Status: None   Collection Time: 03/16/23 12:47 AM   Specimen: Nasopharyngeal Swab; Nasal Swab  Result Value Ref Range Status   SARS Coronavirus 2 by RT PCR NEGATIVE NEGATIVE Final    Comment: Performed at [facility], [address] [place], [place] [address]  Respiratory (~20 pathogens) panel by PCR     Status: None   Collection Time: 03/16/23  12:48 AM   Specimen: Nasopharyngeal Swab; Respiratory  Result Value Ref Range Status   Adenovirus NOT DETECTED NOT DETECTED Final   Coronavirus 229E NOT DETECTED NOT DETECTED Final    Comment: (NOTE) The Coronavirus on the Respiratory Panel, DOES NOT test for the novel  Coronavirus (2019 nCoV)    Coronavirus HKU1 NOT DETECTED NOT DETECTED Final   Coronavirus NL63 NOT DETECTED NOT DETECTED Final   Coronavirus OC43 NOT DETECTED NOT DETECTED Final   Metapneumovirus NOT DETECTED NOT DETECTED Final   Rhinovirus / Enterovirus NOT DETECTED NOT DETECTED Final   Influenza A NOT DETECTED NOT DETECTED Final   Influenza B NOT DETECTED NOT DETECTED Final   Parainfluenza Virus 1 NOT DETECTED NOT DETECTED Final   Parainfluenza Virus 2 NOT DETECTED NOT DETECTED Final   Parainfluenza Virus 3 NOT DETECTED NOT DETECTED Final   Parainfluenza Virus 4 NOT DETECTED NOT DETECTED Final   Respiratory Syncytial Virus NOT DETECTED NOT DETECTED Final   Bordetella pertussis NOT DETECTED NOT DETECTED Final   Bordetella Parapertussis NOT DETECTED NOT DETECTED Final   Chlamydophila pneumoniae NOT DETECTED NOT DETECTED Final   Mycoplasma pneumoniae NOT DETECTED NOT DETECTED Final    Comment: Performed at [facility],  [address] [place], [place] [address]     Labs: Basic Metabolic Panel: Recent Labs  Lab 03/11/23 0036 03/11/23 1900 03/13/23 2236 03/15/23 0430 03/15/23 2030 03/16/23 0507  NA 141 135 135 134* 138  --   K 3.8 4.1 3.8 4.0 4.2  --   CL 100 101 96* 101 97*  --   CO2 22 20* 21* 18* 24  --   GLUCOSE 133* 136* 125* 152* 181*  --   BUN 59* 59* 40* 52* 53*  --   CREATININE 10.98* 10.94* 8.42* 9.79* 10.57*  --   CALCIUM 8.0* 7.0* 8.0* 7.5* 7.7*  --   MG  --   --   --   --   --  2.3   Liver Function Tests: Recent Labs  Lab 03/11/23 0036 03/11/23 1900 03/15/23 2030  AST 18 22 20   ALT 12 13 9   ALKPHOS 180* 166* 189*  BILITOT 0.5 0.5 0.5  PROT 7.5 7.0 8.1  ALBUMIN 3.0* 2.8* 3.0*   CBC: Recent Labs  Lab 03/11/23 0036 03/11/23 1900 03/13/23 2236 03/15/23 0430 03/15/23 2030  WBC 9.2 7.9 8.2 7.1 8.1  NEUTROABS  --  5.2  --  4.9  --   HGB 10.3* 9.9* 12.7* 10.6* 10.0*  HCT 33.8* 33.8* 42.4 36.9* 33.3*  MCV 82.2 84.7 83.0 87.9 83.0  PLT 324 312 400 166 388   CBG: Recent Labs  Lab 03/15/23 0443 03/16/23 0721 03/16/23 1126 03/16/23 1647 03/17/23 1109  GLUCAP 187* 121* 157* 114* 184*   Hgb A1c No results for input(s): "HGBA1C" in the last 72 hours. Lipid Profile No results for input(s): "CHOL", "HDL", "LDLCALC", "TRIG", "CHOLHDL", "LDLDIRECT" in the last 72 hours. Thyroid function studies No results for input(s): "TSH", "T4TOTAL", "T3FREE", "THYROIDAB" in the last 72 hours.  Invalid input(s): "FREET3" Urinalysis    Component Value Date/Time   COLORURINE YELLOW 02/19/2021 0045   APPEARANCEUR HAZY (A) 02/19/2021 0045   APPEARANCEUR Clear 05/09/2020 1509   LABSPEC 1.013 02/19/2021 0045   PHURINE 5.0 02/19/2021 0045   GLUCOSEU >=500 (A) 02/19/2021 0045   HGBUR MODERATE (A) 02/19/2021 0045   BILIRUBINUR NEGATIVE 02/19/2021 0045   BILIRUBINUR Negative 05/09/2020 1509   KETONESUR NEGATIVE 02/19/2021 0045   PROTEINUR >=  300 (A) 02/19/2021 0045   UROBILINOGEN 0.2  02/04/2020 1612   UROBILINOGEN 0.2 02/16/2014 0045   NITRITE NEGATIVE 02/19/2021 0045   LEUKOCYTESUR NEGATIVE 02/19/2021 0045    FURTHER DISCHARGE INSTRUCTIONS:   Get Medicines reviewed and adjusted: Please take all your medications with you for your next visit with your Primary MD   Laboratory/radiological data: Please request your Primary MD to go over all hospital tests and procedure/radiological results at the follow up, please ask your Primary MD to get all Hospital records sent to his/her office.   In some cases, they will be blood work, cultures and biopsy results pending at the time of your discharge. Please request that your primary care M.D. goes through all the records of your hospital data and follows up on these results.   Also Note the following: If you experience worsening of your admission symptoms, develop shortness of breath, life threatening emergency, suicidal or homicidal thoughts you must seek medical attention immediately by calling 911 or calling your MD immediately  if symptoms less severe.   You must read complete instructions/literature along with all the possible adverse reactions/side effects for all the Medicines you take and that have been prescribed to you. Take any new Medicines after you have completely understood and accpet all the possible adverse reactions/side effects.    Do not drive when taking Pain medications or sleeping medications (Benzodaizepines)   Do not take more than prescribed Pain, Sleep and Anxiety Medications. It is not advisable to combine anxiety,sleep and pain medications without talking with your primary care practitioner   Special Instructions: If you have smoked or chewed Tobacco  in the last 2 yrs please stop smoking, stop any regular Alcohol  and or any Recreational drug use.   Wear Seat belts while driving.   Please note: You were cared for by a hospitalist during your hospital stay. Once you are discharged, your primary care  physician will handle any further medical issues. Please note that NO REFILLS for any discharge medications will be authorized once you are discharged, as it is imperative that you return to your primary care physician (or establish a relationship with a primary care physician if you do not have one) for your post hospital discharge needs so that they can reassess your need for medications and monitor your lab values.  Time coordinating discharge: 40 minutes  SIGNED:  Pamella Pert, MD, PhD 03/17/2023, 11:40 AM

## 2023-03-17 NOTE — Assessment & Plan Note (Signed)
Likely to be due to pulmonary edema, although CXR does show bilateral airspace disease. Nephrology plans to take the patient to HD on 10/28. Azactam has been continued in the meantime. Monitor for WBC, fevers.

## 2023-03-17 NOTE — Progress Notes (Addendum)
[facility] Progress Note   Subjective:    Seen and examined at bedside. Not engaging with me. Plan for HD today.  Objective Vitals:   03/16/23 0110 03/16/23 0900 03/16/23 1732 03/17/23 0941  BP: (!) 162/86 (!) 153/82 (!) 154/84 (!) 149/74  Pulse: 92 80 79 76  Resp: 16 18 20 20   Temp: 98.6 F (37 C) 98.1 F (36.7 C) 97.7 F (36.5 C) 98.6 F (37 C)  TempSrc: Oral Oral    SpO2: 92% 100% 97% 93%  Weight:      Height:       Physical Exam General: Alert; Chronically ill-appearing; NAD Heart: S1 and S2; No MRGs Lungs: Breathing unlabored; clear posteriorly Abdomen:Soft and non-tender Extremities:No LE edema bilaterally Dialysis Access: R AVG (+) B/T   Filed Weights   03/15/23 2018  Weight: 81 kg    Intake/Output Summary (Last 24 hours) at 03/17/2023 0959 Last data filed at 03/17/2023 0700 Gross per 24 hour  Intake 600 ml  Output --  Net 600 ml    Additional Objective Labs: Basic Metabolic Panel: Recent Labs  Lab 03/13/23 2236 03/15/23 0430 03/15/23 2030  NA 135 134* 138  K 3.8 4.0 4.2  CL 96* 101 97*  CO2 21* 18* 24  GLUCOSE 125* 152* 181*  BUN 40* 52* 53*  CREATININE 8.42* 9.79* 10.57*  CALCIUM 8.0* 7.5* 7.7*   Liver Function Tests: Recent Labs  Lab 03/11/23 0036 03/11/23 1900 03/15/23 2030  AST 18 22 20   ALT 12 13 9   ALKPHOS 180* 166* 189*  BILITOT 0.5 0.5 0.5  PROT 7.5 7.0 8.1  ALBUMIN 3.0* 2.8* 3.0*   Recent Labs  Lab 03/11/23 0036  LIPASE 31   CBC: Recent Labs  Lab 03/11/23 0036 03/11/23 1900 03/13/23 2236 03/15/23 0430 03/15/23 2030  WBC 9.2 7.9 8.2 7.1 8.1  NEUTROABS  --  5.2  --  4.9  --   HGB 10.3* 9.9* 12.7* 10.6* 10.0*  HCT 33.8* 33.8* 42.4 36.9* 33.3*  MCV 82.2 84.7 83.0 87.9 83.0  PLT 324 312 400 166 388   Blood Culture    Component Value Date/Time   SDES BLOOD LEFT HAND 03/15/2023 2355   SDES BLOOD LEFT FOREARM 03/15/2023 2355   SPECREQUEST  03/15/2023 2355    BOTTLES DRAWN AEROBIC AND  ANAEROBIC Blood Culture adequate volume   SPECREQUEST  03/15/2023 2355    BOTTLES DRAWN AEROBIC AND ANAEROBIC Blood Culture adequate volume   CULT  03/15/2023 2355    NO GROWTH 1 DAY Performed at [facility], [address] [place], [place] [address]    CULT  03/15/2023 2355    NO GROWTH 1 DAY Performed at [facility], [address] [place], [place] [address]    REPTSTATUS PENDING 03/15/2023 2355   REPTSTATUS PENDING 03/15/2023 2355    Cardiac Enzymes: No results for input(s): "CKTOTAL", "CKMB", "CKMBINDEX", "TROPONINI" in the last 168 hours. CBG: Recent Labs  Lab 03/15/23 0443 03/16/23 0721 03/16/23 1126 03/16/23 1647  GLUCAP 187* 121* 157* 114*   Iron Studies: No results for input(s): "IRON", "TIBC", "TRANSFERRIN", "FERRITIN" in the last 72 hours. Lab Results  Component Value Date   INR 1.3 (H) 12/20/2022   INR 1.3 (H) 10/20/2022   INR 1.3 (H) 09/08/2022   Studies/Results: DG Chest 2 View  Result Date: 03/15/2023 CLINICAL DATA:  Shortness of breath EXAM: CHEST - 2 VIEW COMPARISON:  03/13/2023 FINDINGS: Similar cardiomegaly. Increased patchy bilateral airspace and interstitial opacities  greatest in the left mid lung. No pleural effusion or pneumothorax. Elevated right hemidiaphragm. Old right rib fractures. Chronic Nonunited left clavicle fracture. IMPRESSION: Bilateral airspace and interstitial opacities greatest in the left mid lung have increased from 03/13/2023. Findings may be due to worsening pneumonia or pulmonary edema. Follow-up to resolution is recommended. Electronically Signed   By: Minerva Fester M.D.   On: 03/15/2023 22:50    Medications:  azithromycin     [START ON 03/18/2023] aztreonam      (feeding supplement) PROSource Plus  30 mL Oral BID BM   Chlorhexidine Gluconate Cloth  6 each Topical Q0600   darbepoetin (ARANESP) injection - DIALYSIS  100 mcg Subcutaneous Q Sun-1800   heparin  5,000 Units Subcutaneous Q8H   insulin aspart  0-5  Units Subcutaneous QHS   insulin aspart  0-6 Units Subcutaneous TID WC    Dialysis Orders: TTS [facility] 4hr, 400/800, EDW 61.5kg, 2K/2.5Ca bath, AVG, heparin 2000 unit bolus - Mircera IV q 2 weeks (last given 10/12) - Venofer 100mg  IV weekly - last 10/24 - Calcitriol 0.72mcg PO q HD - binders: Phoslo 3/meals   Assessment/Plan: AHRF/pulm edema/possible pneumonia: Chronically overload in setting of poor HD compliance. CXR looks rough, but he looks fine on exam - not using O2. Inpatient HD not typically done on Sunday unless emergent so plan for HD today. Given Vanc + Aztreonam yesterday.  ESRD:  Usual TTS schedule - for HD today. Large UFG as tolerated.  Hypertension/volume: Chronically overloaded, pulm edema on CXR. UF as tolerated.  Anemia: Hgb 10 - due for ESA, will order to be given today.  Metabolic bone disease: CorrCa ok, Phos pending. Continue home binders/VDRA.  Nutrition:  Alb low, adding supps.  T2DM: Per primary.  Non-compliance/homelessness: Many confounding social issues.  ADDENDUM 03/17/23 at 12: 18pm: I was informed Mr. Stauffer refused his scheduled hemodialysis this morning. Unfortunately, we cannot dialyze him later today d/t HD staffing. Patient is not in respiratory distress and remains on RA, thus, plan to discharge him today and he can resume his HD tomorrow as an outpatient. Discussed this with the primary care team.   Salome Holmes, NP [facility] 03/17/2023,9:59 AM  LOS: 0 days

## 2023-03-17 NOTE — TOC Transition Note (Signed)
Transition of Care [facility]) - CM/SW Discharge Note   Patient Details  Name: [name] MRN: [id number] Date of Birth: 12/15/1970  Transition of Care [facility]) CM/SW Contact:  Tom-Johnson, Hershal Coria, RN Phone Number: 03/17/2023, 12:13 PM   Clinical Narrative:     Patient is scheduled for discharge today.  Hospital f/u and discharge instructions on AVS. Prescriptions sent to [facility] pharmacy and meds will be delivered to patient at bedside prior discharge. Patient requested for a cab, Therapist, nutritional and Release of Liability form explained to patient with understanding verbalized, form signed and placed in patient's chart. Cab voucher given to RN. No further TOC needs noted.        Final next level of care: Homeless Shelter Barriers to Discharge: Barriers Resolved   Patient Goals and CMS Choice CMS Medicare.gov Compare Post Acute Care list provided to:: Patient Choice offered to / list presented to : Patient  Discharge Placement                  Patient to be transferred to facility by: [facility]      Discharge Plan and Services Additional resources added to the After Visit Summary for     Discharge Planning Services: CM Consult, Other - See comment Teaching laboratory technician)            DME Arranged: N/A DME Agency: NA       HH Arranged: NA HH Agency: NA        Social Determinants of Health (SDOH) Interventions SDOH Screenings   Food Insecurity: Food Insecurity Present (03/16/2023)  Housing: Medium Risk (03/16/2023)  Transportation Needs: No Transportation Needs (03/16/2023)  Utilities: Not At Risk (03/16/2023)  Depression (PHQ2-9): Low Risk  (10/13/2019)  Financial Resource Strain: High Risk (09/24/2022)   Received from [facility] and Its Subsidiaries and Affiliates, [facility] and Its Subsidiaries and Affiliates  Stress: Stress Concern Present (09/24/2022)   Received from [facility] and Its Subsidiaries and Affiliates, [facility] and Its Subsidiaries and Affiliates  Tobacco Use: Low Risk  (03/15/2023)     Readmission Risk Interventions    01/31/2023    2:35 PM 10/22/2022    2:17 PM 11/07/2021    4:21 PM  Readmission Risk Prevention Plan  Transportation Screening Complete Complete Complete  Medication Review Oceanographer) Complete Complete Complete  PCP or Specialist appointment within 3-5 days of discharge Complete Complete Complete  HRI or Home Care Consult Complete Complete Complete  SW Recovery Care/Counseling Consult Complete Complete Complete  Palliative Care Screening Not Applicable Not Applicable Not Applicable  Skilled Nursing Facility Not Applicable Complete Not Applicable

## 2023-03-17 NOTE — Assessment & Plan Note (Signed)
The patient presents with cough, dyspnea, and evidence of bilaeral air space disease on CXR. However, he has no fever, no WBC count, and procalcitonin of 0.3. Azactam has been continued out of caution while we await dialysis on 03/17/2023.

## 2023-03-17 NOTE — Assessment & Plan Note (Signed)
Patient will go to dialysis on 03/17/2023.

## 2023-03-17 NOTE — Progress Notes (Signed)
Pt for d/c today. Pt receives out-pt HD at [facility] on TTS. Contacted clinic to make them aware of pt's d/c today and that hopefully pt will come to treatment tomorrow as scheduled.   Olivia Canter Renal Navigator [phone]

## 2023-03-17 NOTE — Assessment & Plan Note (Signed)
Nephrology consulted. HD planned for 03/17/2023.

## 2023-03-17 NOTE — Discharge Planning (Signed)
[facility] Patient Discharge Orders- [facility] CLINIC: [facility]  Patient's name: BRONSEN TAJIMA Admit/DC Dates: 03/15/2023 - 03/17/2023  Discharge Diagnoses: Community Acquired Pneumonia - see below for ABX details  Acute Hypoxic Respiratory Failure - now on RA and not in distress  Aranesp: Given: No     Last Hgb: 10 PRBC's Given: No ESA dose for discharge: mircera 100 mcg IV q 2 weeks  IV Iron dose at discharge: Hold iron load while he is on PO ABXs. Can resume when PO ABX course is complete.  Heparin change: No  EDW Change: No   Bath Change: No  Access intervention/Change: No  Calcitriol change: No  Discharge Labs: Calcium 7.7 Albumin 3.0 K+ 4.2  IV Antibiotics: Patient discharged on PO Augmentin and Doxycycline Details: For community acquired pneumonia  On Coumadin?: No   OTHER/APPTS/LAB ORDERS:    D/C Meds to be reconciled by nurse after every discharge.  Completed By: Salome Holmes, NP   Reviewed by: MD:______ RN_______

## 2023-03-17 NOTE — Assessment & Plan Note (Signed)
Due to hypervolemia. Pt to go to dialysis on 03/17/2023.

## 2023-03-17 NOTE — Assessment & Plan Note (Signed)
Due to dialysis. Monitor hemoglobin

## 2023-03-17 NOTE — Plan of Care (Signed)

## 2023-03-17 NOTE — Progress Notes (Signed)
Tried to get him into Hemodialysis treatment,but patient refused at this time.

## 2023-03-18 ENCOUNTER — Emergency Department (HOSPITAL_COMMUNITY)
Admission: EM | Admit: 2023-03-18 | Discharge: 2023-03-19 | Discharge status: Against Medical Advice | Payer: 59 | Attending: Emergency Medicine | Admitting: Emergency Medicine

## 2023-03-18 ENCOUNTER — Encounter (HOSPITAL_COMMUNITY): Payer: Self-pay

## 2023-03-18 ENCOUNTER — Other Ambulatory Visit: Payer: Self-pay

## 2023-03-18 DIAGNOSIS — Z91158 Patient's noncompliance with renal dialysis for other reason: Secondary | ICD-10-CM | POA: Diagnosis not present

## 2023-03-18 DIAGNOSIS — R0602 Shortness of breath: Secondary | ICD-10-CM | POA: Insufficient documentation

## 2023-03-18 DIAGNOSIS — N186 End stage renal disease: Secondary | ICD-10-CM | POA: Insufficient documentation

## 2023-03-18 DIAGNOSIS — Z5321 Procedure and treatment not carried out due to patient leaving prior to being seen by health care provider: Secondary | ICD-10-CM | POA: Diagnosis not present

## 2023-03-18 DIAGNOSIS — J189 Pneumonia, unspecified organism: Secondary | ICD-10-CM | POA: Diagnosis not present

## 2023-03-18 DIAGNOSIS — Z992 Dependence on renal dialysis: Secondary | ICD-10-CM | POA: Diagnosis not present

## 2023-03-18 DIAGNOSIS — K310 Acute dilatation of stomach: Secondary | ICD-10-CM | POA: Insufficient documentation

## 2023-03-18 DIAGNOSIS — I517 Cardiomegaly: Secondary | ICD-10-CM | POA: Diagnosis not present

## 2023-03-18 DIAGNOSIS — I10 Essential (primary) hypertension: Secondary | ICD-10-CM | POA: Diagnosis not present

## 2023-03-18 DIAGNOSIS — R5383 Other fatigue: Secondary | ICD-10-CM | POA: Diagnosis not present

## 2023-03-18 DIAGNOSIS — R0989 Other specified symptoms and signs involving the circulatory and respiratory systems: Secondary | ICD-10-CM | POA: Diagnosis not present

## 2023-03-18 DIAGNOSIS — I120 Hypertensive chronic kidney disease with stage 5 chronic kidney disease or end stage renal disease: Secondary | ICD-10-CM | POA: Diagnosis not present

## 2023-03-18 LAB — COMPREHENSIVE METABOLIC PANEL
ALT: 14 U/L (ref 0–44)
AST: 22 U/L (ref 15–41)
Albumin: 3.2 g/dL — ABNORMAL LOW (ref 3.5–5.0)
Alkaline Phosphatase: 198 U/L — ABNORMAL HIGH (ref 38–126)
Anion gap: 20 — ABNORMAL HIGH (ref 5–15)
BUN: 84 mg/dL — ABNORMAL HIGH (ref 6–20)
CO2: 18 mmol/L — ABNORMAL LOW (ref 22–32)
Calcium: 7.9 mg/dL — ABNORMAL LOW (ref 8.9–10.3)
Chloride: 98 mmol/L (ref 98–111)
Creatinine, Ser: 12.52 mg/dL — ABNORMAL HIGH (ref 0.61–1.24)
GFR, Estimated: 4 mL/min — ABNORMAL LOW (ref >60)
Glucose, Bld: 106 mg/dL — ABNORMAL HIGH (ref 70–99)
Potassium: 5.2 mmol/L — ABNORMAL HIGH (ref 3.5–5.1)
Sodium: 136 mmol/L (ref 135–145)
Total Bilirubin: 0.7 mg/dL (ref 0.3–1.2)
Total Protein: 8.1 g/dL (ref 6.5–8.1)

## 2023-03-18 NOTE — ED Triage Notes (Addendum)
Patient BIB [facility] from [facility]; after being told to put his shoes on and refusing, the patient told them to call 911. Patient uncooperative with EMS, minimally cooperative with care at this time. VS with EMS were BP 168/90, HR 84, RR 20, 94% RA

## 2023-03-18 NOTE — ED Provider Triage Note (Signed)
Emergency Medicine Provider Triage Evaluation Note  TAN CHRETIEN , a 52 y.o. male  was evaluated in triage.  Pt complains of weakness and SOB. Has missed 2 dialysis sessions and feels fluid overloaded. Also has PNA and states that antibiotics have been stolen. Refused to put shoes on at [facility] and called 911 when threatened to be kicked out.   Review of Systems  Positive: sob Negative: Chest pain  Physical Exam  BP (!) 176/84 (BP Location: Left Arm)   Pulse 83   Temp 99.3 F (37.4 C) (Oral)   Resp 20   SpO2 94%  Gen:   Awake, no distress   Resp:  Normal effort MSK:   Moves extremities without difficulty  Other:    Medical Decision Making  Medically screening exam initiated at 9:50 PM.  Appropriate orders placed.  GEOFFREY DICROCE was informed that the remainder of the evaluation will be completed by another provider, this initial triage assessment does not replace that evaluation, and the importance of remaining in the ED until their evaluation is complete.    Pete Pelt, [place] 03/18/23 2151

## 2023-03-18 NOTE — TOC Transition Note (Signed)
Transition of Care - Initial Contact from Inpatient Facility  Date of discharge: 03/17/23 Date of contact: 03/18/23  Method: Attempted Phone Call Spoke to: No Answer  Contacted patient and his significant other to discuss transition of care from recent inpatient hospitalization but both numbers were not in service.   Patient should of returned his outpatient HD unit today at [facility].  Salome Holmes, NP

## 2023-03-19 ENCOUNTER — Emergency Department (HOSPITAL_COMMUNITY)
Admission: EM | Admit: 2023-03-19 | Discharge: 2023-03-20 | Discharge status: Against Medical Advice | Payer: 59 | Source: Home / Self Care

## 2023-03-19 ENCOUNTER — Encounter (HOSPITAL_COMMUNITY): Payer: Self-pay

## 2023-03-19 ENCOUNTER — Emergency Department (HOSPITAL_COMMUNITY): Payer: 59

## 2023-03-19 ENCOUNTER — Other Ambulatory Visit: Payer: Self-pay

## 2023-03-19 DIAGNOSIS — R0602 Shortness of breath: Secondary | ICD-10-CM | POA: Diagnosis not present

## 2023-03-19 DIAGNOSIS — Z5321 Procedure and treatment not carried out due to patient leaving prior to being seen by health care provider: Secondary | ICD-10-CM | POA: Insufficient documentation

## 2023-03-19 DIAGNOSIS — R0989 Other specified symptoms and signs involving the circulatory and respiratory systems: Secondary | ICD-10-CM | POA: Diagnosis not present

## 2023-03-19 DIAGNOSIS — J189 Pneumonia, unspecified organism: Secondary | ICD-10-CM | POA: Insufficient documentation

## 2023-03-19 DIAGNOSIS — I517 Cardiomegaly: Secondary | ICD-10-CM | POA: Diagnosis not present

## 2023-03-19 LAB — CBC WITH DIFFERENTIAL/PLATELET
Abs Immature Granulocytes: 0.06 10*3/uL (ref 0.00–0.07)
Basophils Absolute: 0 10*3/uL (ref 0.0–0.1)
Basophils Relative: 0 %
Eosinophils Absolute: 0.3 10*3/uL (ref 0.0–0.5)
Eosinophils Relative: 3 %
HCT: 36.9 % — ABNORMAL LOW (ref 39.0–52.0)
Hemoglobin: 11.4 g/dL — ABNORMAL LOW (ref 13.0–17.0)
Immature Granulocytes: 1 %
Lymphocytes Relative: 11 %
Lymphs Abs: 1.2 10*3/uL (ref 0.7–4.0)
MCH: 24.8 pg — ABNORMAL LOW (ref 26.0–34.0)
MCHC: 30.9 g/dL (ref 30.0–36.0)
MCV: 80.4 fL (ref 80.0–100.0)
Monocytes Absolute: 1.4 10*3/uL — ABNORMAL HIGH (ref 0.1–1.0)
Monocytes Relative: 13 %
Neutro Abs: 7.5 10*3/uL (ref 1.7–7.7)
Neutrophils Relative %: 72 %
Platelets: 374 10*3/uL (ref 150–400)
RBC: 4.59 MIL/uL (ref 4.22–5.81)
RDW: 20.3 % — ABNORMAL HIGH (ref 11.5–15.5)
WBC: 10.5 10*3/uL (ref 4.0–10.5)
nRBC: 0 % (ref 0.0–0.2)

## 2023-03-19 LAB — TROPONIN I (HIGH SENSITIVITY): Troponin I (High Sensitivity): 188 ng/L (ref <18)

## 2023-03-19 LAB — BRAIN NATRIURETIC PEPTIDE: B Natriuretic Peptide: >4500 pg/mL — ABNORMAL HIGH (ref 0.0–100.0)

## 2023-03-19 MED ORDER — CHLORHEXIDINE GLUCONATE CLOTH 2 % EX PADS: 6.0000 | MEDICATED_PAD | Freq: Every day | CUTANEOUS | Status: DC

## 2023-03-19 MED ORDER — DOXYCYCLINE HYCLATE 100 MG PO TABS
100.0000 mg | ORAL_TABLET | Freq: Two times a day (BID) | ORAL | 0 refills | Status: DC
  Filled 2023-03-19: qty 14, 7d supply, fill #0

## 2023-03-19 NOTE — ED Notes (Signed)
Pt refused chest x-ray

## 2023-03-19 NOTE — ED Notes (Signed)
Patient removed all leads and cords, BP cuff, he stated he wanted to leave and refused v/s. The Nurse was informed.

## 2023-03-19 NOTE — ED Notes (Signed)
Will need Vascular access to get his labs

## 2023-03-19 NOTE — ED Triage Notes (Signed)
Pt is coming in for dialysis and pneumonia per his words in triage. He is no cooperative with the triage process at this time. He was recently admitted for the pneumonia but has not taken any of his outpatient Abx due to " they were stolen ". Pt is otherwise stable at this time.

## 2023-03-19 NOTE — ED Notes (Signed)
Pt refused to ambulate with pulse ox.

## 2023-03-19 NOTE — ED Notes (Signed)
Patient left AMA and refused to sign the AMA paper work. DR Jeannett Senior is aware patient is leaving AMA.

## 2023-03-19 NOTE — ED Provider Notes (Signed)
[facility] EMERGENCY DEPARTMENT AT [facility] Provider Note   CSN: [id number] Arrival date & time: 03/18/23  2126     History  Chief Complaint  Patient presents with   Fatigue    [name] is a 52 y.o. male.  Patient with a history of ESRD on dialysis presenting with shortness of breath.  States he has not had dialysis for the past 5 days and missed last 2 sessions.  He is due for dialysis the morning of October 31. his last dialysis session was October 24. He was diagnosed with pneumonia 4 days ago and hospitalized from the 26 to the 28th.  He was sent home with antibiotics which he states he lost or stolen.  He had 1 dose of antibiotics since he left the hospital.  Denies cough or fever.  Denies chest pain.  Has noticed increased abdominal distention and shortness of breath. He feels generally weak and feels like his breathing is getting worse.  No fever.  The history is provided by the patient.       Home Medications Prior to Admission medications   Medication Sig Start Date End Date Taking? Authorizing Provider  acetaminophen (TYLENOL) 500 MG tablet Take 1 tablet (500 mg total) by mouth every 8 (eight) hours as needed for mild pain. 05/28/22   Leroy Sea, MD  amoxicillin-clavulanate (AUGMENTIN) 250-125 MG tablet Take 1 tablet by mouth daily. 03/14/23   Zadie Rhine, MD  ASPIRIN LOW DOSE 81 MG tablet Take 1 tablet (81 mg total) by mouth daily. 05/28/22   Leroy Sea, MD  calcitRIOL (ROCALTROL) 0.25 MCG capsule Take 1 capsule (0.25 mcg total) by mouth every Tuesday, Thursday, and Saturday at 6 PM. 10/26/22   Ghimire, Werner Lean, MD  calcium acetate (PHOSLO) 667 MG tablet Take 3 tablets (2,001 mg total) by mouth 3 (three) times daily. 03/06/23     carvedilol (COREG) 12.5 MG tablet Take 1 tablet by mouth twice a day with meals 03/06/23     doxycycline (VIBRA-TABS) 100 MG tablet Take 1 tablet (100 mg total) by mouth 2 (two) times daily. 03/17/23    Leatha Gilding, MD  glucose blood test strip Use to check fasting blood sugar once daily. 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 10/26/22   [provider]  hydrALAZINE (APRESOLINE) 25 MG tablet Take 1 tablet (25 mg total) by mouth 3 (three) times daily. 02/15/23   Zannie Cove, MD  insulin aspart (NOVOLOG FLEXPEN) 100 UNIT/ML FlexPen 0-9 Units, Subcutaneous, 3 times daily with meals CBG < 70: Implement Hypoglycemia measures CBG 70 - 120: 0 units CBG 121 - 150: 1 unit CBG 151 - 200: 2 units CBG 201 - 250: 3 units CBG 251 - 300: 5 units CBG 301 - 350: 7 units CBG 351 - 400: 9 units CBG > 400: call MD 10/26/22   Maretta Bees, MD  insulin glargine-yfgn (SEMGLEE) 100 UNIT/ML injection Inject 0.07 mLs (7 Units total) into the skin at bedtime. 10/26/22   Ghimire, Werner Lean, MD  Insulin Pen Needle 32G X 4 MM MISC Use with insulin pens 05/28/22   Leroy Sea, MD  isosorbide mononitrate (IMDUR) 60 MG 24 hr tablet Take 1 tablet (60 mg total) by mouth daily. 10/26/22   Ghimire, Werner Lean, MD  melatonin 3 MG TABS tablet Take 1 tablet (3 mg total) by mouth at bedtime as  needed (insomnia). 10/26/22   Ghimire, Werner Lean, MD  mupirocin ointment (BACTROBAN) 2 % Apply 1 Application topically 3 (three) times daily. 01/07/23   Mardella Layman, MD  ondansetron (ZOFRAN-ODT) 4 MG disintegrating tablet Take 1 tablet (4 mg total) by mouth every 4 (four) hours as needed for nausea/vomiting. 03/05/23   Melene Plan, DO  PROTONIX 40 MG tablet Take 1 tablet (40 mg total) by mouth daily. 05/25/22   Leroy Sea, MD  simethicone (MYLICON) 80 MG chewable tablet Chew 1 tablet (80 mg total) by mouth 4 (four) times daily for 5 days. 11/22/22 12/17/22  Rodolph Bong, MD  gabapentin (NEURONTIN) 300 MG capsule Take 1 capsule (300 mg total) by mouth 3 (three) times daily. Patient not taking: Reported on 07/27/2019 07/29/18 08/30/19  Kallie Locks, FNP  sildenafil (VIAGRA) 25 MG tablet Take 1 tablet (25 mg total) by mouth as needed for erectile dysfunction. for erectile dysfunction. Patient not taking: Reported on 08/11/2020 10/13/19 11/27/20  Kallie Locks, FNP      Allergies    Sulfa antibiotics, Other, Bactrim [sulfamethoxazole-trimethoprim], and Cephalexin    Review of Systems   Review of Systems  Constitutional:  Positive for fatigue. Negative for activity change, appetite change and fever.  HENT:  Negative for congestion and rhinorrhea.   Respiratory:  Positive for cough and shortness of breath.   Cardiovascular:  Negative for chest pain and leg swelling.  Gastrointestinal:  Negative for abdominal pain, nausea and vomiting.  Genitourinary:  Negative for dysuria and hematuria.  Musculoskeletal:  Negative for arthralgias and myalgias.  Skin:  Negative for rash.  Neurological:  Negative for dizziness, weakness and headaches.   all other systems are negative except as noted in the HPI and PMH.    Physical Exam Updated Vital Signs BP (!) 160/95 (BP Location: Left Arm)   Pulse 73   Temp 98.4 F (36.9 C) (Oral)   Resp 16   SpO2 95%  Physical Exam Vitals and nursing note reviewed.  Constitutional:      General: He is not in acute distress.    Appearance: He is well-developed.  HENT:     Head: Normocephalic and atraumatic.     Mouth/Throat:     Pharynx: No oropharyngeal exudate.  Eyes:     Conjunctiva/sclera: Conjunctivae normal.     Pupils: Pupils are equal, round, and reactive to light.  Neck:     Comments: No meningismus. Cardiovascular:     Rate and Rhythm: Normal rate and regular rhythm.     Heart sounds: Normal heart sounds. No murmur heard. Pulmonary:     Effort: Pulmonary effort is normal. No respiratory distress.     Breath sounds: Rhonchi present.     Comments: Bibasilar crackles, scattered rhonchi Abdominal:     General: There is distension.     Palpations: Abdomen is soft.      Tenderness: There is no abdominal tenderness. There is no guarding or rebound.  Musculoskeletal:        General: No tenderness. Normal range of motion.     Cervical back: Normal range of motion and neck supple.     Right lower leg: No edema.     Left lower leg: No edema.  Skin:    General: Skin is warm.  Neurological:     Mental Status: He is alert and oriented to person, place, and time.     Cranial Nerves: No cranial nerve deficit.     Motor: No abnormal  muscle tone.     Coordination: Coordination normal.     Comments:  5/5 strength throughout. CN 2-12 intact.Equal grip strength.   Psychiatric:        Behavior: Behavior normal.     ED Results / Procedures / Treatments   Labs (all labs ordered are listed, but only abnormal results are displayed) Labs Reviewed  CBC WITH DIFFERENTIAL/PLATELET - Abnormal; Notable for the following components:      Result Value   Hemoglobin 11.4 (*)    HCT 36.9 (*)    MCH 24.8 (*)    RDW 20.3 (*)    Monocytes Absolute 1.4 (*)    All other components within normal limits  COMPREHENSIVE METABOLIC PANEL - Abnormal; Notable for the following components:   Potassium 5.2 (*)    CO2 18 (*)    Glucose, Bld 106 (*)    BUN 84 (*)    Creatinine, Ser 12.52 (*)    Calcium 7.9 (*)    Albumin 3.2 (*)    Alkaline Phosphatase 198 (*)    GFR, Estimated 4 (*)    Anion gap 20 (*)    All other components within normal limits  BRAIN NATRIURETIC PEPTIDE - Abnormal; Notable for the following components:   B Natriuretic Peptide >4,500.0 (*)    All other components within normal limits  TROPONIN I (HIGH SENSITIVITY) - Abnormal; Notable for the following components:   Troponin I (High Sensitivity) 188 (*)    All other components within normal limits  TROPONIN I (HIGH SENSITIVITY)    EKG EKG Interpretation Date/Time:  Wednesday March 19 2023 04:22:14 EDT Ventricular Rate:  76 PR Interval:  148 QRS Duration:  158 QT Interval:  486 QTC Calculation: 547 R  Axis:   35  Text Interpretation: Sinus rhythm Probable left atrial enlargement Left ventricular hypertrophy Anterior Q waves, possibly due to LVH Abnormal T, consider ischemia, lateral leads Prolonged QT interval No significant change was found Confirmed by Glynn Octave [phone]) on 03/19/2023 4:30:58 AM  Radiology DG Chest Portable 1 View  Result Date: 03/19/2023 CLINICAL DATA:  Shortness of breath EXAM: PORTABLE CHEST 1 VIEW COMPARISON:  Four days ago FINDINGS: Cardiopericardial enlargement and vascular pedicle widening with congestion of vessels. Pleural fluid was seen on as CT from August 2024. There is no edema, consolidation, effusion, or pneumothorax. Atelectasis is likely at the lower lobes. No visible effusion or pneumothorax. IMPRESSION: Chronic cardiomegaly and vascular congestion. No acute finding when compared to prior Electronically Signed   By: Tiburcio Pea M.D.   On: 03/19/2023 05:45    Procedures Procedures    Medications Ordered in ED Medications - No data to display  ED Course/ Medical Decision Making/ A&P                                 Medical Decision Making Amount and/or Complexity of Data Reviewed Labs: ordered. Decision-making details documented in ED Course. Radiology: ordered and independent interpretation performed. Decision-making details documented in ED Course. ECG/medicine tests: ordered and independent interpretation performed. Decision-making details documented in ED Course.  Risk Prescription drug management.  ESRD patient here with shortness of breath and missed dialysis.  Recent diagnosis of pneumonia with noncompliance with antibiotics.  Room air oxygen saturation in the low 90s.  No increased work of breathing.  Labs show minimal hyperkalemia 5.2. No EKG changes.  Chest x-ray shows pulmonary congestion similar to previous.  Results reviewed  interpreted by me.  O2 saturation dropped in the mid 80s when patient is sleeping.  When he  arouses he is maintained in the low 90s but is refused to keep on his oxygen. BNP greater than 4500.  Troponin 188.  Will trend.  Low suspicion for ACS.  Suspect volume overload more so than pneumonia.  Patient states he did lose his antibiotics.  Will represcribe.  Discussed with Dr. Allena Katz of nephrology.  Will attempt to rearrange dialysis today and likely discharge afterwards.       Final Clinical Impression(s) / ED Diagnoses Final diagnoses:  None    Rx / DC Orders ED Discharge Orders     None         Ravenna Legore, Jeannett Senior, MD 03/19/23 [phone]

## 2023-03-19 NOTE — ED Notes (Signed)
Lab called a critical Troponin 188 Dr Glynn Octave was made aware.

## 2023-03-20 ENCOUNTER — Encounter (HOSPITAL_COMMUNITY): Payer: Self-pay | Admitting: Emergency Medicine

## 2023-03-20 ENCOUNTER — Emergency Department (HOSPITAL_COMMUNITY)
Admission: EM | Admit: 2023-03-20 | Discharge: 2023-03-21 | Disposition: A | Discharge status: Home / Self Care | Payer: 59

## 2023-03-20 ENCOUNTER — Emergency Department (HOSPITAL_COMMUNITY): Payer: 59

## 2023-03-20 ENCOUNTER — Other Ambulatory Visit: Payer: Self-pay

## 2023-03-20 DIAGNOSIS — R0989 Other specified symptoms and signs involving the circulatory and respiratory systems: Secondary | ICD-10-CM | POA: Diagnosis not present

## 2023-03-20 DIAGNOSIS — Z79899 Other long term (current) drug therapy: Secondary | ICD-10-CM | POA: Insufficient documentation

## 2023-03-20 DIAGNOSIS — Z7982 Long term (current) use of aspirin: Secondary | ICD-10-CM | POA: Diagnosis not present

## 2023-03-20 DIAGNOSIS — Z992 Dependence on renal dialysis: Secondary | ICD-10-CM | POA: Insufficient documentation

## 2023-03-20 DIAGNOSIS — N186 End stage renal disease: Secondary | ICD-10-CM | POA: Diagnosis not present

## 2023-03-20 DIAGNOSIS — R600 Localized edema: Secondary | ICD-10-CM | POA: Diagnosis not present

## 2023-03-20 DIAGNOSIS — I10 Essential (primary) hypertension: Secondary | ICD-10-CM | POA: Diagnosis not present

## 2023-03-20 DIAGNOSIS — R0602 Shortness of breath: Secondary | ICD-10-CM | POA: Diagnosis not present

## 2023-03-20 DIAGNOSIS — E1122 Type 2 diabetes mellitus with diabetic chronic kidney disease: Secondary | ICD-10-CM | POA: Diagnosis not present

## 2023-03-20 DIAGNOSIS — I120 Hypertensive chronic kidney disease with stage 5 chronic kidney disease or end stage renal disease: Secondary | ICD-10-CM | POA: Insufficient documentation

## 2023-03-20 DIAGNOSIS — I517 Cardiomegaly: Secondary | ICD-10-CM | POA: Diagnosis not present

## 2023-03-20 DIAGNOSIS — R069 Unspecified abnormalities of breathing: Secondary | ICD-10-CM | POA: Diagnosis not present

## 2023-03-20 LAB — BASIC METABOLIC PANEL
Anion gap: 19 — ABNORMAL HIGH (ref 5–15)
BUN: 92 mg/dL — ABNORMAL HIGH (ref 6–20)
CO2: 17 mmol/L — ABNORMAL LOW (ref 22–32)
Calcium: 7.5 mg/dL — ABNORMAL LOW (ref 8.9–10.3)
Chloride: 103 mmol/L (ref 98–111)
Creatinine, Ser: 13.63 mg/dL — ABNORMAL HIGH (ref 0.61–1.24)
GFR, Estimated: 4 mL/min — ABNORMAL LOW (ref >60)
Glucose, Bld: 140 mg/dL — ABNORMAL HIGH (ref 70–99)
Potassium: 5 mmol/L (ref 3.5–5.1)
Sodium: 139 mmol/L (ref 135–145)

## 2023-03-20 LAB — CBC
HCT: 35.7 % — ABNORMAL LOW (ref 39.0–52.0)
Hemoglobin: 10.7 g/dL — ABNORMAL LOW (ref 13.0–17.0)
MCH: 24.6 pg — ABNORMAL LOW (ref 26.0–34.0)
MCHC: 30 g/dL (ref 30.0–36.0)
MCV: 82.1 fL (ref 80.0–100.0)
Platelets: 391 10*3/uL (ref 150–400)
RBC: 4.35 MIL/uL (ref 4.22–5.81)
RDW: 20.6 % — ABNORMAL HIGH (ref 11.5–15.5)
WBC: 9 10*3/uL (ref 4.0–10.5)
nRBC: 0 % (ref 0.0–0.2)

## 2023-03-20 MED ORDER — CHLORHEXIDINE GLUCONATE CLOTH 2 % EX PADS: 6.0000 | MEDICATED_PAD | Freq: Every day | CUTANEOUS | Status: DC

## 2023-03-20 NOTE — ED Notes (Addendum)
Pt was stuck. Wasn't successful. - disregard. Labs was successful.

## 2023-03-20 NOTE — ED Notes (Signed)
Pt currently very irritable and not wanting to answer questions.  States "everything should be in the chart."  Pt in no acute distress at this time.  SpO2 94% RA.  Awaiting further orders from EDP.

## 2023-03-20 NOTE — ED Provider Notes (Signed)
[facility] KIDNEY DIALYSIS UNIT Provider Note   CSN: [id number] Arrival date & time: 03/20/23  1705     History  Chief Complaint  Patient presents with   Circulatory Problem    [name] is a 52 y.o. male.  52 year old male presenting emergency department for shortness of breath.  Patient is poor historian and difficult to get details from.  He does note that he has not gone to dialysis since this past Thursday.  Has worsening shortness of breath since that time.  Told he had pneumonia at some point, but denies cough.  No chest pain.        Home Medications Prior to Admission medications   Medication Sig Start Date End Date Taking? Authorizing Provider  acetaminophen (TYLENOL) 500 MG tablet Take 1 tablet (500 mg total) by mouth every 8 (eight) hours as needed for mild pain. 05/28/22   Leroy Sea, MD  amoxicillin-clavulanate (AUGMENTIN) 250-125 MG tablet Take 1 tablet by mouth daily. 03/14/23   Zadie Rhine, MD  ASPIRIN LOW DOSE 81 MG tablet Take 1 tablet (81 mg total) by mouth daily. 05/28/22   Leroy Sea, MD  calcitRIOL (ROCALTROL) 0.25 MCG capsule Take 1 capsule (0.25 mcg total) by mouth every Tuesday, Thursday, and Saturday at 6 PM. 10/26/22   Ghimire, Werner Lean, MD  calcium acetate (PHOSLO) 667 MG tablet Take 3 tablets (2,001 mg total) by mouth 3 (three) times daily. 03/06/23     carvedilol (COREG) 12.5 MG tablet Take 1 tablet by mouth twice a day with meals 03/06/23     doxycycline (VIBRA-TABS) 100 MG tablet Take 1 tablet (100 mg total) by mouth 2 (two) times daily. 03/19/23   Rancour, Jeannett Senior, MD  doxycycline (VIBRA-TABS) 100 MG tablet Take 1 tablet (100 mg total) by mouth 2 (two) times daily. 03/17/23   Leatha Gilding, MD  glucose blood test strip Use to check fasting blood sugar once daily. 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 10/26/22    [provider]  hydrALAZINE (APRESOLINE) 25 MG tablet Take 1 tablet (25 mg total) by mouth 3 (three) times daily. 02/15/23   Zannie Cove, MD  insulin aspart (NOVOLOG FLEXPEN) 100 UNIT/ML FlexPen 0-9 Units, Subcutaneous, 3 times daily with meals CBG < 70: Implement Hypoglycemia measures CBG 70 - 120: 0 units CBG 121 - 150: 1 unit CBG 151 - 200: 2 units CBG 201 - 250: 3 units CBG 251 - 300: 5 units CBG 301 - 350: 7 units CBG 351 - 400: 9 units CBG > 400: call MD 10/26/22   Maretta Bees, MD  insulin glargine-yfgn (SEMGLEE) 100 UNIT/ML injection Inject 0.07 mLs (7 Units total) into the skin at bedtime. 10/26/22   Ghimire, Werner Lean, MD  Insulin Pen Needle 32G X 4 MM MISC Use with insulin pens 05/28/22   Leroy Sea, MD  isosorbide mononitrate (IMDUR) 60 MG 24 hr tablet Take 1 tablet (60 mg total) by mouth daily. 10/26/22   Ghimire, Werner Lean, MD  melatonin 3 MG TABS tablet Take 1 tablet (3 mg total) by mouth at bedtime as needed (insomnia). 10/26/22   Ghimire, Werner Lean, MD  mupirocin ointment (BACTROBAN) 2 % Apply 1 Application topically 3 (three) times daily. 01/07/23   Mardella Layman, MD  ondansetron (ZOFRAN-ODT) 4 MG disintegrating tablet Take 1 tablet (4 mg total) by mouth every 4 (four) hours  as needed for nausea/vomiting. 03/05/23   Melene Plan, DO  PROTONIX 40 MG tablet Take 1 tablet (40 mg total) by mouth daily. 05/25/22   Leroy Sea, MD  simethicone (MYLICON) 80 MG chewable tablet Chew 1 tablet (80 mg total) by mouth 4 (four) times daily for 5 days. 11/22/22 12/17/22  Rodolph Bong, MD  gabapentin (NEURONTIN) 300 MG capsule Take 1 capsule (300 mg total) by mouth 3 (three) times daily. Patient not taking: Reported on 07/27/2019 07/29/18 08/30/19  Kallie Locks, FNP  sildenafil (VIAGRA) 25 MG tablet Take 1 tablet (25 mg total) by mouth as needed for erectile dysfunction. for erectile dysfunction. Patient not taking: Reported on 08/11/2020 10/13/19 11/27/20  Kallie Locks, FNP       Allergies    Sulfa antibiotics, Other, Bactrim [sulfamethoxazole-trimethoprim], and Cephalexin    Review of Systems   Review of Systems  Physical Exam Updated Vital Signs BP (!) 171/88   Pulse 94   Temp (P) 98.8 F (37.1 C)   Resp 17   Ht 5\' 7"  (1.702 m)   Wt 82 kg   SpO2 95%   BMI 28.31 kg/m  Physical Exam Vitals and nursing note reviewed.  Constitutional:      General: He is not in acute distress.    Appearance: He is not toxic-appearing.  HENT:     Head: Normocephalic.  Eyes:     Conjunctiva/sclera: Conjunctivae normal.  Cardiovascular:     Rate and Rhythm: Normal rate and regular rhythm.  Pulmonary:     Effort: Pulmonary effort is normal.     Breath sounds: Normal breath sounds.  Abdominal:     General: Abdomen is flat. There is no distension.     Tenderness: There is no abdominal tenderness. There is no guarding or rebound.  Musculoskeletal:     Right lower leg: Edema present.     Left lower leg: Edema present.  Skin:    General: Skin is warm and dry.     Capillary Refill: Capillary refill takes less than 2 seconds.  Neurological:     Mental Status: He is alert and oriented to person, place, and time.  Psychiatric:        Mood and Affect: Mood normal.        Behavior: Behavior normal.     ED Results / Procedures / Treatments   Labs (all labs ordered are listed, but only abnormal results are displayed) Labs Reviewed  CBC - Abnormal; Notable for the following components:      Result Value   Hemoglobin 10.7 (*)    HCT 35.7 (*)    MCH 24.6 (*)    RDW 20.6 (*)    All other components within normal limits  BASIC METABOLIC PANEL - Abnormal; Notable for the following components:   CO2 17 (*)    Glucose, Bld 140 (*)    BUN 92 (*)    Creatinine, Ser 13.63 (*)    Calcium 7.5 (*)    GFR, Estimated 4 (*)    Anion gap 19 (*)    All other components within normal limits  HEPATITIS B SURFACE ANTIGEN  HEPATITIS B SURFACE ANTIBODY, QUANTITATIVE     EKG EKG Interpretation Date/Time:  Thursday March 20 2023 18:27:30 EDT Ventricular Rate:  91 PR Interval:  142 QRS Duration:  151 QT Interval:  434 QTC Calculation: 534 R Axis:   138  Text Interpretation: Sinus rhythm Probable left atrial enlargement Nonspecific intraventricular conduction delay Anterior  infarct, old Minimal ST depression Confirmed by Estanislado Pandy ([id number]) on 03/20/2023 6:44:17 PM  Radiology DG Chest Port 1 View  Result Date: 03/20/2023 CLINICAL DATA:  Shortness of breath. EXAM: PORTABLE CHEST 1 VIEW COMPARISON:  March 19, 2023 FINDINGS: The cardiac silhouette is markedly enlarged and unchanged in size. There is moderate severity, predominately central prominence of the pulmonary vasculature. There is no evidence of focal consolidation, pleural effusion or pneumothorax. The visualized skeletal structures are unremarkable. IMPRESSION: Marked cardiomegaly with moderate severity pulmonary vascular congestion. Electronically Signed   By: Aram Candela M.D.   On: 03/20/2023 19:49   DG Chest Portable 1 View  Result Date: 03/19/2023 CLINICAL DATA:  Shortness of breath EXAM: PORTABLE CHEST 1 VIEW COMPARISON:  Four days ago FINDINGS: Cardiopericardial enlargement and vascular pedicle widening with congestion of vessels. Pleural fluid was seen on as CT from August 2024. There is no edema, consolidation, effusion, or pneumothorax. Atelectasis is likely at the lower lobes. No visible effusion or pneumothorax. IMPRESSION: Chronic cardiomegaly and vascular congestion. No acute finding when compared to prior Electronically Signed   By: Tiburcio Pea M.D.   On: 03/19/2023 05:45    Procedures Procedures    Medications Ordered in ED Medications  Chlorhexidine Gluconate Cloth 2 % PADS 6 each (has no administration in time range)    ED Course/ Medical Decision Making/ A&P Clinical Course as of 03/20/23 2357  Thu Mar 20, 2023  1927 Consulted nephrology.  Plan for  dialysis this evening and reevaluation in the emergency department of symptoms. [TY]    Clinical Course User Index [TY] Coral Spikes, DO                                 Medical Decision Making This is a 52 year old male with ESRD has not gone to dialysis in several days.  He is documented noncompliant with several medications per chart review and often leaves AMA.  Patient is hypertensive 189/103 on arrival, slightly tachypneic, but maintaining oxygen saturation of 92 to 93% on room air.  Also per chart review appears that he should be wearing oxygen at baseline.  He has an elevated BUN and creatinine, but no elevated potassium.  No leukocytosis to suggest infectious etiology.  Stable anemia.  Chest x-ray reviewed; not consistent with pneumonia.  Appears to have vascular congestion/edema.  EKG with no evidence of hyperkalemia.  Discussed with nephrology, plan to dialyze this evening and will reevaluate here in the emergency department. Care signed out to overnight doctor.   Amount and/or Complexity of Data Reviewed Labs: ordered. Radiology: ordered. ECG/medicine tests: ordered.          Final Clinical Impression(s) / ED Diagnoses Final diagnoses:  None    Rx / DC Orders ED Discharge Orders     None         Coral Spikes, DO 03/20/23 2357

## 2023-03-20 NOTE — ED Triage Notes (Signed)
Pt BIB [facility] from [facility] center due to missed dialysis for the past three treatments and complaining of SHOB.  Pt non-compliant with treatment and non-compliant with oxygen.  Pt should be on 2L nasal cannula at baseline.  VS BP 171/98, HR 87, SpO2 94% CBG 150

## 2023-03-20 NOTE — ED Notes (Addendum)
Patient refused x ray

## 2023-03-20 NOTE — ED Notes (Signed)
Patient was non compliance would not lay on his back to put leads on so that EKG could be done and also primary leads to hooked up to monitor.

## 2023-03-20 NOTE — ED Notes (Signed)
Pt refused x 3 to go back to a room, pt stated he waited to long and needs to go to dialysis now

## 2023-03-20 NOTE — Progress Notes (Signed)
Pt left AMA 03/17/23 and didn't go to outpatient dialysis since 03/13/23.  Here with SOB and volume overload.  Will plan for HD session today and then go back to the ED for discharge.

## 2023-03-20 NOTE — ED Notes (Signed)
Pt refused vitals 

## 2023-03-20 NOTE — ED Notes (Signed)
Pt is refusing to go to the room at this time as he wants to go to dialysis, I explained that he has to go to room/bed first. Pt went to bathroom instead and stated he just needed dialysis.

## 2023-03-21 ENCOUNTER — Other Ambulatory Visit: Payer: Self-pay

## 2023-03-21 LAB — CULTURE, BLOOD (ROUTINE X 2)
Culture: NO GROWTH
Culture: NO GROWTH
Special Requests: ADEQUATE
Special Requests: ADEQUATE

## 2023-03-21 LAB — HEPATITIS B SURFACE ANTIGEN: Hepatitis B Surface Ag: NONREACTIVE

## 2023-03-21 MED ORDER — DOXYCYCLINE HYCLATE 100 MG PO TABS
100.0000 mg | ORAL_TABLET | Freq: Two times a day (BID) | ORAL | 0 refills | Status: DC
  Filled 2023-03-21 – 2023-04-04 (×3): qty 14, 7d supply, fill #0

## 2023-03-21 NOTE — ED Notes (Signed)
Security required at the time of patient's D/C because he is refusing to leave.

## 2023-03-21 NOTE — Discharge Instructions (Signed)
Go to dialysis as scheduled.

## 2023-03-21 NOTE — ED Notes (Signed)
Paper scrubs and socks provided for the patient

## 2023-03-21 NOTE — ED Provider Notes (Signed)
Patient with urine from dialysis, is resting comfortably in no distress and maintaining adequate oxygen saturation on room air.  He is safe for discharge, instructed to make sure that he does not miss dialysis sessions in the future.   Dione Booze, MD 03/21/23 [phone]

## 2023-03-22 LAB — HEPATITIS B SURFACE ANTIBODY, QUANTITATIVE: Hep B S AB Quant (Post): <3.5 m[IU]/mL — ABNORMAL LOW

## 2023-03-24 ENCOUNTER — Other Ambulatory Visit: Payer: Self-pay

## 2023-03-29 ENCOUNTER — Other Ambulatory Visit: Payer: Self-pay

## 2023-03-29 ENCOUNTER — Emergency Department (HOSPITAL_COMMUNITY)
Admission: EM | Admit: 2023-03-29 | Discharge: 2023-03-30 | Disposition: A | Discharge status: Home / Self Care | Payer: 59 | Attending: Emergency Medicine | Admitting: Emergency Medicine

## 2023-03-29 ENCOUNTER — Encounter (HOSPITAL_COMMUNITY): Payer: Self-pay | Admitting: *Deleted

## 2023-03-29 DIAGNOSIS — Z7982 Long term (current) use of aspirin: Secondary | ICD-10-CM | POA: Insufficient documentation

## 2023-03-29 DIAGNOSIS — Z992 Dependence on renal dialysis: Secondary | ICD-10-CM | POA: Diagnosis not present

## 2023-03-29 DIAGNOSIS — Z794 Long term (current) use of insulin: Secondary | ICD-10-CM | POA: Diagnosis not present

## 2023-03-29 DIAGNOSIS — N186 End stage renal disease: Secondary | ICD-10-CM | POA: Diagnosis not present

## 2023-03-29 DIAGNOSIS — R918 Other nonspecific abnormal finding of lung field: Secondary | ICD-10-CM | POA: Diagnosis not present

## 2023-03-29 DIAGNOSIS — Z79899 Other long term (current) drug therapy: Secondary | ICD-10-CM | POA: Diagnosis not present

## 2023-03-29 DIAGNOSIS — R0989 Other specified symptoms and signs involving the circulatory and respiratory systems: Secondary | ICD-10-CM | POA: Diagnosis not present

## 2023-03-29 DIAGNOSIS — I120 Hypertensive chronic kidney disease with stage 5 chronic kidney disease or end stage renal disease: Secondary | ICD-10-CM | POA: Diagnosis not present

## 2023-03-29 DIAGNOSIS — E1122 Type 2 diabetes mellitus with diabetic chronic kidney disease: Secondary | ICD-10-CM | POA: Diagnosis not present

## 2023-03-29 DIAGNOSIS — R0602 Shortness of breath: Secondary | ICD-10-CM | POA: Diagnosis not present

## 2023-03-29 DIAGNOSIS — R1110 Vomiting, unspecified: Secondary | ICD-10-CM | POA: Diagnosis not present

## 2023-03-29 NOTE — ED Triage Notes (Signed)
Patient is rude and wont verify his birthday so I can ensure I got the right patient. I don't feel comfortable ticking him for labs. Triage Rn brenda aware.

## 2023-03-29 NOTE — ED Notes (Signed)
Pt not answering to call for triage

## 2023-03-29 NOTE — ED Triage Notes (Signed)
Pt states he needs dialysis.  Last dialyzed on Thursday here at the hospital.  States he is being tx for pneumonia, but has not started taking antibiotics.

## 2023-03-30 ENCOUNTER — Encounter (HOSPITAL_COMMUNITY): Payer: Self-pay | Admitting: Emergency Medicine

## 2023-03-30 ENCOUNTER — Other Ambulatory Visit: Payer: Self-pay

## 2023-03-30 ENCOUNTER — Emergency Department (HOSPITAL_COMMUNITY)
Admission: EM | Admit: 2023-03-30 | Discharge: 2023-03-30 | Disposition: A | Discharge status: Home / Self Care | Payer: 59 | Source: Home / Self Care | Attending: Emergency Medicine | Admitting: Emergency Medicine

## 2023-03-30 ENCOUNTER — Emergency Department (HOSPITAL_COMMUNITY): Payer: 59

## 2023-03-30 DIAGNOSIS — Z794 Long term (current) use of insulin: Secondary | ICD-10-CM | POA: Insufficient documentation

## 2023-03-30 DIAGNOSIS — Z992 Dependence on renal dialysis: Secondary | ICD-10-CM | POA: Insufficient documentation

## 2023-03-30 DIAGNOSIS — R918 Other nonspecific abnormal finding of lung field: Secondary | ICD-10-CM | POA: Diagnosis not present

## 2023-03-30 DIAGNOSIS — Z79899 Other long term (current) drug therapy: Secondary | ICD-10-CM | POA: Insufficient documentation

## 2023-03-30 DIAGNOSIS — R0602 Shortness of breath: Secondary | ICD-10-CM | POA: Diagnosis not present

## 2023-03-30 DIAGNOSIS — R1110 Vomiting, unspecified: Secondary | ICD-10-CM | POA: Diagnosis not present

## 2023-03-30 DIAGNOSIS — I120 Hypertensive chronic kidney disease with stage 5 chronic kidney disease or end stage renal disease: Secondary | ICD-10-CM | POA: Diagnosis not present

## 2023-03-30 DIAGNOSIS — N186 End stage renal disease: Secondary | ICD-10-CM | POA: Insufficient documentation

## 2023-03-30 DIAGNOSIS — R0989 Other specified symptoms and signs involving the circulatory and respiratory systems: Secondary | ICD-10-CM | POA: Diagnosis not present

## 2023-03-30 LAB — CBC WITH DIFFERENTIAL/PLATELET
Abs Immature Granulocytes: 0.09 10*3/uL — ABNORMAL HIGH (ref 0.00–0.07)
Basophils Absolute: 0 10*3/uL (ref 0.0–0.1)
Basophils Relative: 0 %
Eosinophils Absolute: 0.3 10*3/uL (ref 0.0–0.5)
Eosinophils Relative: 3 %
HCT: 32.2 % — ABNORMAL LOW (ref 39.0–52.0)
Hemoglobin: 9.7 g/dL — ABNORMAL LOW (ref 13.0–17.0)
Immature Granulocytes: 1 %
Lymphocytes Relative: 9 %
Lymphs Abs: 0.8 10*3/uL (ref 0.7–4.0)
MCH: 24.6 pg — ABNORMAL LOW (ref 26.0–34.0)
MCHC: 30.1 g/dL (ref 30.0–36.0)
MCV: 81.7 fL (ref 80.0–100.0)
Monocytes Absolute: 1.2 10*3/uL — ABNORMAL HIGH (ref 0.1–1.0)
Monocytes Relative: 12 %
Neutro Abs: 7.4 10*3/uL (ref 1.7–7.7)
Neutrophils Relative %: 75 %
Platelets: 415 10*3/uL — ABNORMAL HIGH (ref 150–400)
RBC: 3.94 MIL/uL — ABNORMAL LOW (ref 4.22–5.81)
RDW: 19.9 % — ABNORMAL HIGH (ref 11.5–15.5)
WBC: 9.8 10*3/uL (ref 4.0–10.5)
nRBC: 0 % (ref 0.0–0.2)

## 2023-03-30 LAB — BASIC METABOLIC PANEL
Anion gap: 19 — ABNORMAL HIGH (ref 5–15)
BUN: 92 mg/dL — ABNORMAL HIGH (ref 6–20)
CO2: 17 mmol/L — ABNORMAL LOW (ref 22–32)
Calcium: 7.6 mg/dL — ABNORMAL LOW (ref 8.9–10.3)
Chloride: 97 mmol/L — ABNORMAL LOW (ref 98–111)
Creatinine, Ser: 15.23 mg/dL — ABNORMAL HIGH (ref 0.61–1.24)
GFR, Estimated: 3 mL/min — ABNORMAL LOW (ref >60)
Glucose, Bld: 154 mg/dL — ABNORMAL HIGH (ref 70–99)
Potassium: 4.3 mmol/L (ref 3.5–5.1)
Sodium: 133 mmol/L — ABNORMAL LOW (ref 135–145)

## 2023-03-30 LAB — I-STAT CHEM 8, ED
BUN: 99 mg/dL — ABNORMAL HIGH (ref 6–20)
Calcium, Ion: 0.94 mmol/L — ABNORMAL LOW (ref 1.15–1.40)
Chloride: 101 mmol/L (ref 98–111)
Creatinine, Ser: 17.3 mg/dL — ABNORMAL HIGH (ref 0.61–1.24)
Glucose, Bld: 131 mg/dL — ABNORMAL HIGH (ref 70–99)
HCT: 33 % — ABNORMAL LOW (ref 39.0–52.0)
Hemoglobin: 11.2 g/dL — ABNORMAL LOW (ref 13.0–17.0)
Potassium: 4.5 mmol/L (ref 3.5–5.1)
Sodium: 137 mmol/L (ref 135–145)
TCO2: 21 mmol/L — ABNORMAL LOW (ref 22–32)

## 2023-03-30 NOTE — Discharge Instructions (Signed)
It is very important that you follow-up with your dialysis center for regularly scheduled dialysis.  At this time you do not have an emergent reason for dialysis.  Return for any new or worsening symptoms.

## 2023-03-30 NOTE — ED Triage Notes (Signed)
Patient dropped off by friend outside. Patient states "cone told me I'm not wanted there and to come to [facility] for care." Patient states he has not had dialysis since Oct 31st. Patient reports emesis en route here. Patient states he is homeless and has not had transportation to dialysis.

## 2023-03-30 NOTE — ED Notes (Signed)
Pt refusing VS for discharge

## 2023-03-30 NOTE — Discharge Instructions (Signed)
Call your dialysis center at [facility] tomorrow in the morning so you can get dialyzed tomorrow.  If they will not do your dialysis tomorrow you can return to [facility].  They do dialysis at that hospital but not at [facility] long

## 2023-03-30 NOTE — ED Provider Notes (Addendum)
[name] EMERGENCY DEPARTMENT AT [facility] Provider Note   CSN: [id number] Arrival date & time: 03/29/23  1605     History  Chief Complaint  Patient presents with   Vascular Access Problem    [name] is a 52 y.o. male.  HPI     This is a 52 year old male well-known to our emergency department who presents requesting dialysis.  Patient is not very cooperative with history taking.  When asked when his last dialysis session was he states "it was here."  It appears that he last had dialysis on 11/1.  He does endorse some shortness of breath with laying flat.  He does not have a dialysis home.  Denies any chest pain, abdominal pain, nausea, vomiting.  Home Medications Prior to Admission medications   Medication Sig Start Date End Date Taking? Authorizing Provider  acetaminophen (TYLENOL) 500 MG tablet Take 1 tablet (500 mg total) by mouth every 8 (eight) hours as needed for mild pain. 05/28/22   Leroy Sea, MD  amoxicillin-clavulanate (AUGMENTIN) 250-125 MG tablet Take 1 tablet by mouth daily. 03/14/23   Zadie Rhine, MD  ASPIRIN LOW DOSE 81 MG tablet Take 1 tablet (81 mg total) by mouth daily. 05/28/22   Leroy Sea, MD  calcitRIOL (ROCALTROL) 0.25 MCG capsule Take 1 capsule (0.25 mcg total) by mouth every Tuesday, Thursday, and Saturday at 6 PM. 10/26/22   Ghimire, Werner Lean, MD  calcium acetate (PHOSLO) 667 MG tablet Take 3 tablets (2,001 mg total) by mouth 3 (three) times daily. 03/06/23     carvedilol (COREG) 12.5 MG tablet Take 1 tablet by mouth twice a day with meals 03/06/23     doxycycline (VIBRA-TABS) 100 MG tablet Take 1 tablet (100 mg total) by mouth 2 (two) times daily. 03/21/23   Dione Booze, MD  glucose blood test strip Use to check fasting blood sugar once daily. 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 10/26/22   [provider]  hydrALAZINE  (APRESOLINE) 25 MG tablet Take 1 tablet (25 mg total) by mouth 3 (three) times daily. 02/15/23   Zannie Cove, MD  insulin aspart (NOVOLOG FLEXPEN) 100 UNIT/ML FlexPen 0-9 Units, Subcutaneous, 3 times daily with meals CBG < 70: Implement Hypoglycemia measures CBG 70 - 120: 0 units CBG 121 - 150: 1 unit CBG 151 - 200: 2 units CBG 201 - 250: 3 units CBG 251 - 300: 5 units CBG 301 - 350: 7 units CBG 351 - 400: 9 units CBG > 400: call MD 10/26/22   Maretta Bees, MD  insulin glargine-yfgn (SEMGLEE) 100 UNIT/ML injection Inject 0.07 mLs (7 Units total) into the skin at bedtime. 10/26/22   Ghimire, Werner Lean, MD  Insulin Pen Needle 32G X 4 MM MISC Use with insulin pens 05/28/22   Leroy Sea, MD  isosorbide mononitrate (IMDUR) 60 MG 24 hr tablet Take 1 tablet (60 mg total) by mouth daily. 10/26/22   Ghimire, Werner Lean, MD  melatonin 3 MG TABS tablet Take 1 tablet (3 mg total) by mouth at bedtime as needed (insomnia). 10/26/22   Ghimire, Werner Lean, MD  mupirocin ointment (BACTROBAN) 2 % Apply 1 Application topically 3 (three) times daily. 01/07/23   Mardella Layman, MD  ondansetron (ZOFRAN-ODT) 4 MG disintegrating tablet Take 1 tablet (4 mg total) by mouth every 4 (four) hours as needed for nausea/vomiting. 03/05/23   Adela Lank,  Dan, DO  PROTONIX 40 MG tablet Take 1 tablet (40 mg total) by mouth daily. 05/25/22   Leroy Sea, MD  simethicone (MYLICON) 80 MG chewable tablet Chew 1 tablet (80 mg total) by mouth 4 (four) times daily for 5 days. 11/22/22 12/17/22  Rodolph Bong, MD  gabapentin (NEURONTIN) 300 MG capsule Take 1 capsule (300 mg total) by mouth 3 (three) times daily. Patient not taking: Reported on 07/27/2019 07/29/18 08/30/19  Kallie Locks, FNP  sildenafil (VIAGRA) 25 MG tablet Take 1 tablet (25 mg total) by mouth as needed for erectile dysfunction. for erectile dysfunction. Patient not taking: Reported on 08/11/2020 10/13/19 11/27/20  Kallie Locks, FNP      Allergies    Sulfa antibiotics,  Other, Bactrim [sulfamethoxazole-trimethoprim], and Cephalexin    Review of Systems   Review of Systems  Constitutional:  Negative for fever.  Respiratory:  Positive for shortness of breath.   Cardiovascular:  Negative for chest pain.  Gastrointestinal:  Negative for abdominal pain.  All other systems reviewed and are negative.   Physical Exam Updated Vital Signs BP (!) 163/87 (BP Location: Left Arm)   Pulse 76   Temp 99.1 F (37.3 C) (Oral)   Resp 16   Ht 1.702 m (5\' 7" )   Wt 82 kg   SpO2 94%   BMI 28.31 kg/m  Physical Exam Vitals and nursing note reviewed.  Constitutional:      Appearance: He is well-developed. He is not ill-appearing.  HENT:     Head: Normocephalic and atraumatic.  Eyes:     Pupils: Pupils are equal, round, and reactive to light.  Cardiovascular:     Rate and Rhythm: Normal rate and regular rhythm.     Heart sounds: Normal heart sounds.  Pulmonary:     Effort: Pulmonary effort is normal. No respiratory distress.     Breath sounds: No wheezing.  Abdominal:     Palpations: Abdomen is soft.     Tenderness: There is no abdominal tenderness.  Musculoskeletal:     Cervical back: Neck supple.     Right lower leg: Edema present.     Left lower leg: Edema present.  Lymphadenopathy:     Cervical: No cervical adenopathy.  Skin:    General: Skin is warm and dry.  Neurological:     Mental Status: He is alert and oriented to person, place, and time.     ED Results / Procedures / Treatments   Labs (all labs ordered are listed, but only abnormal results are displayed) Labs Reviewed  CBC WITH DIFFERENTIAL/PLATELET - Abnormal; Notable for the following components:      Result Value   RBC 3.94 (*)    Hemoglobin 9.7 (*)    HCT 32.2 (*)    MCH 24.6 (*)    RDW 19.9 (*)    Platelets 415 (*)    Monocytes Absolute 1.2 (*)    Abs Immature Granulocytes 0.09 (*)    All other components within normal limits  BASIC METABOLIC PANEL - Abnormal; Notable for the  following components:   Sodium 133 (*)    Chloride 97 (*)    CO2 17 (*)    Glucose, Bld 154 (*)    BUN 92 (*)    Creatinine, Ser 15.23 (*)    Calcium 7.6 (*)    GFR, Estimated 3 (*)    Anion gap 19 (*)    All other components within normal limits    EKG None  Radiology No results found.  Procedures Procedures    Medications Ordered in ED Medications - No data to display  ED Course/ Medical Decision Making/ A&P                                 Medical Decision Making Amount and/or Complexity of Data Reviewed Labs: ordered.   This patient presents to the ED for concern of shortness of breath needs dialysis, this involves an extensive number of treatment options, and is a complaint that carries with it a high risk of complications and morbidity.  I considered the following differential and admission for this acute, potentially life threatening condition.  The differential diagnosis includes overload, metabolic derangement  MDM:    This is a 52 year old male with end-stage renal disease well-known to our emergency department who is noncompliant with dialysis and does not have a dialysis home who presents requesting dialysis.  He does report some shortness of breath but he is not hypoxic he is in no respiratory distress.  Vital signs notable for blood pressure of 163/87.  Last dialyzed November 1.  Labs obtained and reviewed.  Normal potassium but rising creatinine and a BUN of 92.  Will hold and discuss with nephrology to dialyze during the day.  Spoke with Dr. Valentino Nose.  Given that it is Sunday and he does not have an emergent indication for dialysis, he can be discharged.  He tells me that he is supposed to dialyze on [employer].  I encouraged him to follow-up and call Monday to see if he can get an earlier given how long it has been since his last dialysis session.  (Labs, imaging, consults)  Labs: I Ordered, and personally interpreted labs.  The pertinent results include:  CBC, BMP  Imaging Studies ordered: I ordered imaging studies including none I independently visualized and interpreted imaging. I agree with the radiologist interpretation  Additional history obtained from chart review.  External records from outside source obtained and reviewed including prior evaluations  Cardiac Monitoring: The patient was not maintained on a cardiac monitor.  If on the cardiac monitor, I personally viewed and interpreted the cardiac monitored which showed an underlying rhythm of: N/A  Reevaluation: After the interventions noted above, I reevaluated the patient and found that they have :stayed the same  Social Determinants of Health:  lives independently  Disposition: Discharge  Co morbidities that complicate the patient evaluation  Past Medical History:  Diagnosis Date   Anxiety 02/2019   Anxiety 10/2019   Diabetes mellitus type II, uncontrolled 07/17/2006   Elevated alkaline phosphatase level 01/2019   ESRD on hemodialysis (HCC)    tts HD   ESSENTIAL HYPERTENSION 07/17/2006   Homelessness 10/13/2021   HYPERLIPIDEMIA 11/24/2008   Insomnia 02/2019   Left bundle branch block 02/22/2019   Lesion of lip 10/2019   Pneumonia 10/22/2011   Suicidal ideations 02/2019   Vitamin D deficiency 01/2019     Medicines No orders of the defined types were placed in this encounter.   I have reviewed the patients home medicines and have made adjustments as needed  Problem List / ED Course: Problem List Items Addressed This Visit       Genitourinary   ESRD (end stage renal disease) (HCC) - Primary                Final Clinical Impression(s) / ED Diagnoses Final diagnoses:  ESRD (end stage renal  disease) [facility])    Rx / DC Orders ED Discharge Orders     None         Elmyra Banwart, Mayer Masker, MD 03/30/23 4540    Shon Baton, MD 03/30/23 [phone]

## 2023-03-30 NOTE — ED Provider Notes (Signed)
[place] EMERGENCY DEPARTMENT AT [facility] Provider Note   CSN: [id number] Arrival date & time: 03/30/23  1725     History {Add pertinent medical, surgical, social history, OB history to HPI:1} Chief Complaint  Patient presents with   Emesis   Vascular Access Problem    Needs Dialysis     [name] is a 52 y.o. male.  Patient has a history of end-stage renal disease.  He was seen earlier today at [facility] and was cleared to go home.  He was told to go to contact his dialysis center tomorrow for dialysis.   Shortness of Breath      Home Medications Prior to Admission medications   Medication Sig Start Date End Date Taking? Authorizing Provider  acetaminophen (TYLENOL) 500 MG tablet Take 1 tablet (500 mg total) by mouth every 8 (eight) hours as needed for mild pain. 05/28/22   Leroy Sea, MD  amoxicillin-clavulanate (AUGMENTIN) 250-125 MG tablet Take 1 tablet by mouth daily. 03/14/23   Zadie Rhine, MD  ASPIRIN LOW DOSE 81 MG tablet Take 1 tablet (81 mg total) by mouth daily. 05/28/22   Leroy Sea, MD  calcitRIOL (ROCALTROL) 0.25 MCG capsule Take 1 capsule (0.25 mcg total) by mouth every Tuesday, Thursday, and Saturday at 6 PM. 10/26/22   Ghimire, Werner Lean, MD  calcium acetate (PHOSLO) 667 MG tablet Take 3 tablets (2,001 mg total) by mouth 3 (three) times daily. 03/06/23     carvedilol (COREG) 12.5 MG tablet Take 1 tablet by mouth twice a day with meals 03/06/23     doxycycline (VIBRA-TABS) 100 MG tablet Take 1 tablet (100 mg total) by mouth 2 (two) times daily. 03/21/23   Dione Booze, MD  glucose blood test strip Use to check fasting blood sugar once daily. 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 10/26/22   [provider]  hydrALAZINE (APRESOLINE) 25 MG tablet Take 1 tablet (25 mg total) by mouth 3 (three) times daily. 02/15/23   Zannie Cove, MD  insulin aspart (NOVOLOG FLEXPEN) 100 UNIT/ML FlexPen 0-9 Units, Subcutaneous, 3 times daily with meals CBG < 70: Implement Hypoglycemia measures CBG 70 - 120: 0 units CBG 121 - 150: 1 unit CBG 151 - 200: 2 units CBG 201 - 250: 3 units CBG 251 - 300: 5 units CBG 301 - 350: 7 units CBG 351 - 400: 9 units CBG > 400: call MD 10/26/22   Maretta Bees, MD  insulin glargine-yfgn (SEMGLEE) 100 UNIT/ML injection Inject 0.07 mLs (7 Units total) into the skin at bedtime. 10/26/22   Ghimire, Werner Lean, MD  Insulin Pen Needle 32G X 4 MM MISC Use with insulin pens 05/28/22   Leroy Sea, MD  isosorbide mononitrate (IMDUR) 60 MG 24 hr tablet Take 1 tablet (60 mg total) by mouth daily. 10/26/22   Ghimire, Werner Lean, MD  melatonin 3 MG TABS tablet Take 1 tablet (3 mg total) by mouth at bedtime as needed (insomnia). 10/26/22   Ghimire, Werner Lean, MD  mupirocin ointment (BACTROBAN) 2 % Apply 1 Application topically 3 (three) times daily. 01/07/23   Mardella Layman, MD  ondansetron (ZOFRAN-ODT) 4 MG disintegrating tablet Take 1 tablet (4 mg total) by mouth every 4 (four) hours as needed for nausea/vomiting. 03/05/23   Melene Plan, DO  PROTONIX 40 MG tablet Take 1 tablet (40 mg total) by mouth  daily. 05/25/22   Leroy Sea, MD  simethicone (MYLICON) 80 MG chewable tablet Chew 1 tablet (80 mg total) by mouth 4 (four) times daily for 5 days. 11/22/22 12/17/22  Rodolph Bong, MD  gabapentin (NEURONTIN) 300 MG capsule Take 1 capsule (300 mg total) by mouth 3 (three) times daily. Patient not taking: Reported on 07/27/2019 07/29/18 08/30/19  Kallie Locks, FNP  sildenafil (VIAGRA) 25 MG tablet Take 1 tablet (25 mg total) by mouth as needed for erectile dysfunction. for erectile dysfunction. Patient not taking: Reported on 08/11/2020 10/13/19 11/27/20  Kallie Locks, FNP      Allergies    Sulfa antibiotics, Other, Bactrim [sulfamethoxazole-trimethoprim], and Cephalexin    Review of Systems   Review of  Systems  Respiratory:  Positive for shortness of breath.     Physical Exam Updated Vital Signs BP (!) 167/90   Pulse 86   Temp 98.6 F (37 C) (Oral)   Resp 18   SpO2 93%  Physical Exam  ED Results / Procedures / Treatments   Labs (all labs ordered are listed, but only abnormal results are displayed) Labs Reviewed  I-STAT CHEM 8, ED - Abnormal; Notable for the following components:      Result Value   BUN 99 (*)    Creatinine, Ser 17.30 (*)    Glucose, Bld 131 (*)    Calcium, Ion 0.94 (*)    TCO2 21 (*)    Hemoglobin 11.2 (*)    HCT 33.0 (*)    All other components within normal limits    EKG None  Radiology DG Chest Port 1 View  Result Date: 03/30/2023 CLINICAL DATA:  Vomiting and shortness of breath. EXAM: PORTABLE CHEST 1 VIEW COMPARISON:  March 20, 2023 FINDINGS: There is stable moderate to marked severity cardiac silhouette enlargement. Mild prominence of the central pulmonary vasculature is seen. Mild airspace disease is noted within the mid left lung. No pleural effusion or pneumothorax is identified. The visualized skeletal structures are unremarkable. IMPRESSION: 1. Stable cardiomegaly with mild central pulmonary vascular congestion. 2. Mild mid left lung atelectasis and/or infiltrate. Electronically Signed   By: Aram Candela M.D.   On: 03/30/2023 19:48    Procedures Procedures  {Document cardiac monitor, telemetry assessment procedure when appropriate:1}  Medications Ordered in ED Medications - No data to display  ED Course/ Medical Decision Making/ A&P   {   Click here for ABCD2, HEART and other calculatorsREFRESH Note before signing :1}                              Medical Decision Making Amount and/or Complexity of Data Reviewed Radiology: ordered.  Patient with end-stage renal disease.  His potassium is normal and he is not having any significant respiratory problems.  Patient was told to make arrangements to go to his dialysis center  tomorrow.  If he cannot get dialysis tomorrow and is getting worse he was told to go to [facility] emergency department  {Document critical care time when appropriate:1} {Document review of labs and clinical decision tools ie heart score, Chads2Vasc2 etc:1}  {Document your independent review of radiology images, and any outside records:1} {Document your discussion with family members, caretakers, and with consultants:1} {Document social determinants of health affecting pt's care:1} {Document your decision making why or why not admission, treatments were needed:1} Final Clinical Impression(s) / ED Diagnoses Final diagnoses:  ESRD (end stage renal disease) (HCC)  Rx / DC Orders ED Discharge Orders     None

## 2023-03-31 ENCOUNTER — Emergency Department (HOSPITAL_COMMUNITY)
Admission: EM | Admit: 2023-03-31 | Discharge: 2023-04-01 | Disposition: A | Discharge status: Home / Self Care | Payer: 59 | Attending: Emergency Medicine | Admitting: Emergency Medicine

## 2023-03-31 ENCOUNTER — Other Ambulatory Visit: Payer: Self-pay

## 2023-03-31 DIAGNOSIS — R9431 Abnormal electrocardiogram [ECG] [EKG]: Secondary | ICD-10-CM | POA: Diagnosis not present

## 2023-03-31 DIAGNOSIS — N186 End stage renal disease: Secondary | ICD-10-CM | POA: Insufficient documentation

## 2023-03-31 DIAGNOSIS — I10 Essential (primary) hypertension: Secondary | ICD-10-CM | POA: Diagnosis not present

## 2023-03-31 DIAGNOSIS — Z794 Long term (current) use of insulin: Secondary | ICD-10-CM | POA: Insufficient documentation

## 2023-03-31 DIAGNOSIS — E1122 Type 2 diabetes mellitus with diabetic chronic kidney disease: Secondary | ICD-10-CM | POA: Insufficient documentation

## 2023-03-31 DIAGNOSIS — E8770 Fluid overload, unspecified: Secondary | ICD-10-CM | POA: Insufficient documentation

## 2023-03-31 DIAGNOSIS — R0989 Other specified symptoms and signs involving the circulatory and respiratory systems: Secondary | ICD-10-CM | POA: Diagnosis not present

## 2023-03-31 DIAGNOSIS — Z992 Dependence on renal dialysis: Secondary | ICD-10-CM | POA: Insufficient documentation

## 2023-03-31 DIAGNOSIS — J189 Pneumonia, unspecified organism: Secondary | ICD-10-CM

## 2023-03-31 DIAGNOSIS — Z7982 Long term (current) use of aspirin: Secondary | ICD-10-CM | POA: Diagnosis not present

## 2023-03-31 DIAGNOSIS — J168 Pneumonia due to other specified infectious organisms: Secondary | ICD-10-CM | POA: Diagnosis not present

## 2023-03-31 DIAGNOSIS — I509 Heart failure, unspecified: Secondary | ICD-10-CM | POA: Diagnosis not present

## 2023-03-31 DIAGNOSIS — R918 Other nonspecific abnormal finding of lung field: Secondary | ICD-10-CM | POA: Diagnosis not present

## 2023-03-31 DIAGNOSIS — R059 Cough, unspecified: Secondary | ICD-10-CM | POA: Diagnosis not present

## 2023-03-31 DIAGNOSIS — J181 Lobar pneumonia, unspecified organism: Secondary | ICD-10-CM | POA: Insufficient documentation

## 2023-03-31 DIAGNOSIS — J811 Chronic pulmonary edema: Secondary | ICD-10-CM | POA: Diagnosis not present

## 2023-03-31 LAB — CBC
HCT: 33.1 % — ABNORMAL LOW (ref 39.0–52.0)
Hemoglobin: 10 g/dL — ABNORMAL LOW (ref 13.0–17.0)
MCH: 24.9 pg — ABNORMAL LOW (ref 26.0–34.0)
MCHC: 30.2 g/dL (ref 30.0–36.0)
MCV: 82.3 fL (ref 80.0–100.0)
Platelets: 462 10*3/uL — ABNORMAL HIGH (ref 150–400)
RBC: 4.02 MIL/uL — ABNORMAL LOW (ref 4.22–5.81)
RDW: 20 % — ABNORMAL HIGH (ref 11.5–15.5)
WBC: 12 10*3/uL — ABNORMAL HIGH (ref 4.0–10.5)
nRBC: 0 % (ref 0.0–0.2)

## 2023-03-31 LAB — BASIC METABOLIC PANEL
Anion gap: 20 — ABNORMAL HIGH (ref 5–15)
BUN: 99 mg/dL — ABNORMAL HIGH (ref 6–20)
CO2: 13 mmol/L — ABNORMAL LOW (ref 22–32)
Calcium: 7.4 mg/dL — ABNORMAL LOW (ref 8.9–10.3)
Chloride: 98 mmol/L (ref 98–111)
Creatinine, Ser: 15.74 mg/dL — ABNORMAL HIGH (ref 0.61–1.24)
GFR, Estimated: 3 mL/min — ABNORMAL LOW (ref >60)
Glucose, Bld: 119 mg/dL — ABNORMAL HIGH (ref 70–99)
Potassium: 4.6 mmol/L (ref 3.5–5.1)
Sodium: 131 mmol/L — ABNORMAL LOW (ref 135–145)

## 2023-03-31 NOTE — ED Triage Notes (Signed)
Patient complains of cough and also needs dialysis. Has not had dialysis since 10/31 d/t transportation issues.

## 2023-04-01 ENCOUNTER — Other Ambulatory Visit (HOSPITAL_COMMUNITY): Payer: Self-pay

## 2023-04-01 ENCOUNTER — Emergency Department (HOSPITAL_COMMUNITY): Payer: 59

## 2023-04-01 DIAGNOSIS — R0989 Other specified symptoms and signs involving the circulatory and respiratory systems: Secondary | ICD-10-CM | POA: Diagnosis not present

## 2023-04-01 DIAGNOSIS — J811 Chronic pulmonary edema: Secondary | ICD-10-CM | POA: Diagnosis not present

## 2023-04-01 DIAGNOSIS — I509 Heart failure, unspecified: Secondary | ICD-10-CM | POA: Diagnosis not present

## 2023-04-01 DIAGNOSIS — R918 Other nonspecific abnormal finding of lung field: Secondary | ICD-10-CM | POA: Diagnosis not present

## 2023-04-01 LAB — CBC
HCT: 32.1 % — ABNORMAL LOW (ref 39.0–52.0)
Hemoglobin: 10 g/dL — ABNORMAL LOW (ref 13.0–17.0)
MCH: 24.9 pg — ABNORMAL LOW (ref 26.0–34.0)
MCHC: 31.2 g/dL (ref 30.0–36.0)
MCV: 80 fL (ref 80.0–100.0)
Platelets: 457 10*3/uL — ABNORMAL HIGH (ref 150–400)
RBC: 4.01 MIL/uL — ABNORMAL LOW (ref 4.22–5.81)
RDW: 19.9 % — ABNORMAL HIGH (ref 11.5–15.5)
WBC: 9.6 10*3/uL (ref 4.0–10.5)
nRBC: 0 % (ref 0.0–0.2)

## 2023-04-01 LAB — RENAL FUNCTION PANEL
Albumin: 2.9 g/dL — ABNORMAL LOW (ref 3.5–5.0)
Anion gap: 18 — ABNORMAL HIGH (ref 5–15)
BUN: 68 mg/dL — ABNORMAL HIGH (ref 6–20)
CO2: 19 mmol/L — ABNORMAL LOW (ref 22–32)
Calcium: 7.5 mg/dL — ABNORMAL LOW (ref 8.9–10.3)
Chloride: 95 mmol/L — ABNORMAL LOW (ref 98–111)
Creatinine, Ser: 10.74 mg/dL — ABNORMAL HIGH (ref 0.61–1.24)
GFR, Estimated: 5 mL/min — ABNORMAL LOW (ref >60)
Glucose, Bld: 126 mg/dL — ABNORMAL HIGH (ref 70–99)
Phosphorus: 4.9 mg/dL — ABNORMAL HIGH (ref 2.5–4.6)
Potassium: 3.5 mmol/L (ref 3.5–5.1)
Sodium: 132 mmol/L — ABNORMAL LOW (ref 135–145)

## 2023-04-01 MED ORDER — AZITHROMYCIN 250 MG PO TABS
250.0000 mg | ORAL_TABLET | Freq: Every day | ORAL | 0 refills | Status: DC
  Filled 2023-04-01: qty 4, 4d supply, fill #0

## 2023-04-01 MED ORDER — AMOXICILLIN-POT CLAVULANATE 250-125 MG PO TABS
1.0000 | ORAL_TABLET | Freq: Once | ORAL | Status: DC
  Filled 2023-04-01: qty 1

## 2023-04-01 MED ORDER — AZITHROMYCIN 250 MG PO TABS
500.0000 mg | ORAL_TABLET | Freq: Once | ORAL | Status: DC
  Filled 2023-04-01: qty 2

## 2023-04-01 MED ORDER — AMOXICILLIN 500 MG PO CAPS
500.0000 mg | ORAL_CAPSULE | Freq: Two times a day (BID) | ORAL | 0 refills | Status: DC
  Filled 2023-04-01: qty 14, 7d supply, fill #0

## 2023-04-01 MED ORDER — CHLORHEXIDINE GLUCONATE CLOTH 2 % EX PADS: 6.0000 | MEDICATED_PAD | Freq: Every day | CUTANEOUS | Status: DC

## 2023-04-01 MED ORDER — ALTEPLASE 2 MG IJ SOLR
2.0000 mg | Freq: Once | INTRAMUSCULAR | Status: DC | PRN
Start: 2023-04-01 — End: 2023-04-01

## 2023-04-01 MED ORDER — NEPRO/CARBSTEADY PO LIQD: 237.0000 mL | ORAL | Status: DC | PRN

## 2023-04-01 MED ORDER — LIDOCAINE-PRILOCAINE 2.5-2.5 % EX CREA: 1.0000 | TOPICAL_CREAM | CUTANEOUS | Status: DC | PRN

## 2023-04-01 MED ORDER — HEPARIN SODIUM (PORCINE) 1000 UNIT/ML DIALYSIS: 1000.0000 [IU] | INTRAMUSCULAR | Status: DC | PRN

## 2023-04-01 MED ORDER — ANTICOAGULANT SODIUM CITRATE 4% (200MG/5ML) IV SOLN: 5.0000 mL | Status: DC | PRN

## 2023-04-01 MED ORDER — PENTAFLUOROPROP-TETRAFLUOROETH EX AERO: 1.0000 | INHALATION_SPRAY | CUTANEOUS | Status: DC | PRN

## 2023-04-01 MED ORDER — AMOXICILLIN-POT CLAVULANATE 250-125 MG PO TABS
1.0000 | ORAL_TABLET | Freq: Two times a day (BID) | ORAL | 0 refills | Status: DC
  Filled 2023-04-01: qty 14, 7d supply, fill #0

## 2023-04-01 MED ORDER — LIDOCAINE HCL (PF) 1 % IJ SOLN: 5.0000 mL | INTRAMUSCULAR | Status: DC | PRN

## 2023-04-01 MED ORDER — AZITHROMYCIN 250 MG PO TABS
250.0000 mg | ORAL_TABLET | Freq: Every day | ORAL | 0 refills | Status: DC
  Filled 2023-04-01: qty 6, 6d supply, fill #0

## 2023-04-01 MED ORDER — HEPARIN SODIUM (PORCINE) 1000 UNIT/ML DIALYSIS
2000.0000 [IU] | Freq: Once | INTRAMUSCULAR | Status: AC
Start: 2023-04-01 — End: 2023-04-01
  Administered 2023-04-01: 2000 [IU] via INTRAVENOUS_CENTRAL
  Filled 2023-04-01: qty 2

## 2023-04-01 NOTE — ED Provider Notes (Signed)
[place] EMERGENCY DEPARTMENT AT [facility] Provider Note   CSN: [id number] Arrival date & time: 03/31/23  1825     History  No chief complaint on file.   [name] is a 52 y.o. male, history of ESRD, diabetes type 2, who presents to the ED secondary to a cough for an unknown known amount of time, and need for dialysis.  He states he has not had dialysis for it since Halloween, and is requesting it now.  Reports the cough has been going on for some time, and was worse after eating.  Denies any fever, chills, shortness of breath, or urinary symptoms.    Home Medications Prior to Admission medications   Medication Sig Start Date End Date Taking? Authorizing Provider  amoxicillin (AMOXIL) 500 MG capsule Take 1 capsule (500 mg total) by mouth 2 (two) times daily. 04/01/23  Yes Kameisha Malicki L, PA  azithromycin (ZITHROMAX) 250 MG tablet Take first 2 tablets by mouth together, then take 1 tablet every day until finished. 04/01/23  Yes Braylon Grenda L, PA  acetaminophen (TYLENOL) 500 MG tablet Take 1 tablet (500 mg total) by mouth every 8 (eight) hours as needed for mild pain. 05/28/22   Leroy Sea, MD  ASPIRIN LOW DOSE 81 MG tablet Take 1 tablet (81 mg total) by mouth daily. 05/28/22   Leroy Sea, MD  calcitRIOL (ROCALTROL) 0.25 MCG capsule Take 1 capsule (0.25 mcg total) by mouth every Tuesday, Thursday, and Saturday at 6 PM. 10/26/22   Ghimire, Werner Lean, MD  calcium acetate (PHOSLO) 667 MG tablet Take 3 tablets (2,001 mg total) by mouth 3 (three) times daily. 03/06/23     carvedilol (COREG) 12.5 MG tablet Take 1 tablet by mouth twice a day with meals 03/06/23     doxycycline (VIBRA-TABS) 100 MG tablet Take 1 tablet (100 mg total) by mouth 2 (two) times daily. 03/21/23   Dione Booze, MD  glucose blood test strip Use to check fasting blood sugar once daily. 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 10/26/22   [provider]  hydrALAZINE (APRESOLINE) 25 MG tablet Take 1 tablet (25 mg total) by mouth 3 (three) times daily. 02/15/23   Zannie Cove, MD  insulin aspart (NOVOLOG FLEXPEN) 100 UNIT/ML FlexPen 0-9 Units, Subcutaneous, 3 times daily with meals CBG < 70: Implement Hypoglycemia measures CBG 70 - 120: 0 units CBG 121 - 150: 1 unit CBG 151 - 200: 2 units CBG 201 - 250: 3 units CBG 251 - 300: 5 units CBG 301 - 350: 7 units CBG 351 - 400: 9 units CBG > 400: call MD 10/26/22   Maretta Bees, MD  insulin glargine-yfgn (SEMGLEE) 100 UNIT/ML injection Inject 0.07 mLs (7 Units total) into the skin at bedtime. 10/26/22   Ghimire, Werner Lean, MD  Insulin Pen Needle 32G X 4 MM MISC Use with insulin pens 05/28/22   Leroy Sea, MD  isosorbide mononitrate (IMDUR) 60 MG 24 hr tablet Take 1 tablet (60 mg total) by mouth daily. 10/26/22   Ghimire, Werner Lean, MD  melatonin 3 MG TABS tablet Take 1 tablet (3 mg total) by mouth at bedtime as needed (insomnia). 10/26/22   Ghimire, Werner Lean, MD  mupirocin ointment (BACTROBAN) 2 % Apply 1 Application topically 3 (three) times daily. 01/07/23   Mardella Layman, MD  ondansetron (ZOFRAN-ODT) 4 MG disintegrating tablet Take  1 tablet (4 mg total) by mouth every 4 (four) hours as needed for nausea/vomiting. 03/05/23   Melene Plan, DO  PROTONIX 40 MG tablet Take 1 tablet (40 mg total) by mouth daily. 05/25/22   Leroy Sea, MD  simethicone (MYLICON) 80 MG chewable tablet Chew 1 tablet (80 mg total) by mouth 4 (four) times daily for 5 days. 11/22/22 12/17/22  Rodolph Bong, MD  gabapentin (NEURONTIN) 300 MG capsule Take 1 capsule (300 mg total) by mouth 3 (three) times daily. Patient not taking: Reported on 07/27/2019 07/29/18 08/30/19  Kallie Locks, FNP  sildenafil (VIAGRA) 25 MG tablet Take 1 tablet (25 mg total) by mouth as needed for erectile dysfunction. for erectile dysfunction. Patient not taking: Reported on 08/11/2020 10/13/19  11/27/20  Kallie Locks, FNP      Allergies    Sulfa antibiotics, Other, Bactrim [sulfamethoxazole-trimethoprim], and Cephalexin    Review of Systems   Review of Systems  Respiratory:  Positive for cough. Negative for shortness of breath.     Physical Exam Updated Vital Signs BP (!) 150/87 (BP Location: Left Arm)   Pulse 98   Temp 98 F (36.7 C) (Oral)   Resp 18   SpO2 98%  Physical Exam Vitals and nursing note reviewed.  Constitutional:      General: He is not in acute distress.    Appearance: He is well-developed.  HENT:     Head: Normocephalic and atraumatic.  Eyes:     Conjunctiva/sclera: Conjunctivae normal.  Cardiovascular:     Rate and Rhythm: Normal rate and regular rhythm.     Heart sounds: No murmur heard. Pulmonary:     Effort: Pulmonary effort is normal. No respiratory distress.     Breath sounds: Normal breath sounds.  Abdominal:     Palpations: Abdomen is soft.     Tenderness: There is no abdominal tenderness.  Musculoskeletal:        General: No swelling.     Cervical back: Neck supple.  Skin:    General: Skin is warm and dry.     Capillary Refill: Capillary refill takes less than 2 seconds.  Neurological:     Mental Status: He is alert.  Psychiatric:        Mood and Affect: Mood normal.     ED Results / Procedures / Treatments   Labs (all labs ordered are listed, but only abnormal results are displayed) Labs Reviewed  BASIC METABOLIC PANEL - Abnormal; Notable for the following components:      Result Value   Sodium 131 (*)    CO2 13 (*)    Glucose, Bld 119 (*)    BUN 99 (*)    Creatinine, Ser 15.74 (*)    Calcium 7.4 (*)    GFR, Estimated 3 (*)    Anion gap 20 (*)    All other components within normal limits  CBC - Abnormal; Notable for the following components:   WBC 12.0 (*)    RBC 4.02 (*)    Hemoglobin 10.0 (*)    HCT 33.1 (*)    MCH 24.9 (*)    RDW 20.0 (*)    Platelets 462 (*)    All other components within normal  limits    EKG None  Radiology DG Chest 2 View  Result Date: 04/01/2023 CLINICAL DATA:  Cough.  Leukocytosis. EXAM: CHEST - 2 VIEW COMPARISON:  03/30/2023. FINDINGS: Redemonstration of diffuse pulmonary vascular congestion without significant interval change. There are  also persistent nodular opacities overlying the left mid lower lung zones, which are new since the prior CT scan from 12/20/2022 and concerning for atelectasis and/or pneumonia. Bilateral lung fields are otherwise clear of dense consolidation or lung collapse. Note is again made of elevated right hemidiaphragm. Bilateral costophrenic angles are clear. Stable markedly enlarged cardio-mediastinal silhouette. No acute osseous abnormalities. The soft tissues are within normal limits. IMPRESSION: *Marked cardiomegaly with congestive heart failure/pulmonary edema. *There are also persistent nodular opacities overlying the left mid lower lung zones, which are new since the prior CT scan from 12/20/2022 and concerning for atelectasis and/or pneumonia. Follow-up to clearing is recommended. Electronically Signed   By: Jules Schick M.D.   On: 04/01/2023 07:51   DG Chest Port 1 View  Result Date: 03/30/2023 CLINICAL DATA:  Vomiting and shortness of breath. EXAM: PORTABLE CHEST 1 VIEW COMPARISON:  March 20, 2023 FINDINGS: There is stable moderate to marked severity cardiac silhouette enlargement. Mild prominence of the central pulmonary vasculature is seen. Mild airspace disease is noted within the mid left lung. No pleural effusion or pneumothorax is identified. The visualized skeletal structures are unremarkable. IMPRESSION: 1. Stable cardiomegaly with mild central pulmonary vascular congestion. 2. Mild mid left lung atelectasis and/or infiltrate. Electronically Signed   By: Aram Candela M.D.   On: 03/30/2023 19:48    Procedures Procedures    Medications Ordered in ED Medications  azithromycin (ZITHROMAX) tablet 500 mg (500 mg Oral  Not Given 04/01/23 0859)  amoxicillin-clavulanate (AUGMENTIN) 250-125 MG per tablet 1 tablet (1 tablet Oral Not Given 04/01/23 0900)  Chlorhexidine Gluconate Cloth 2 % PADS 6 each (0 each Topical Hold 04/01/23 1121)  pentafluoroprop-tetrafluoroeth (GEBAUERS) aerosol 1 Application (has no administration in time range)  lidocaine (PF) (XYLOCAINE) 1 % injection 5 mL (has no administration in time range)  lidocaine-prilocaine (EMLA) cream 1 Application (has no administration in time range)  feeding supplement (NEPRO CARB STEADY) liquid 237 mL (has no administration in time range)  heparin injection 1,000 Units (has no administration in time range)  anticoagulant sodium citrate solution 5 mL (has no administration in time range)  alteplase (CATHFLO ACTIVASE) injection 2 mg (has no administration in time range)  heparin injection 2,000 Units (has no administration in time range)    ED Course/ Medical Decision Making/ A&P                                 Medical Decision Making Patient is a 52 year old male, history of ESRD on dialysis, here for cough, it has been going on for several weeks, as well as feeling fluid overloaded.  He has not been to dialysis for the last 12 days.  We will obtain a chest x-ray given cough, and obtain blood work for further evaluation.  It appears that he has had a history of pneumonia, he has not been taking his medications, I am suspicious that he may have pneumonia again.    Amount and/or Complexity of Data Reviewed Labs:     Details: Elevated creatinine Radiology: ordered.    Details: Chest x-ray shows left lung atelectasis/infiltrate Discussion of management or test interpretation with external provider(s): Discussed with patient, he has not been taking his antibiotics for pneumonia, the site think it is persistent, we will send him home with some antibiotics, including amoxicillin, and azithromycin, as the [name] pharmacy does not have any Augmentin.  I placed it for  the [facility] pharmacy  so the patient can get them today, as he is homeless and has not failed picking them up.He is not tachycardic, hypoxic, or ill-appearing, thus I do not think that he is still enough to go into the hospital for this.  If you eat I did speak to Dr. Arlean Hopping who evaluated the patient, and he would like to have the patient go to dialysis, and then subsequently be discharged from the ER when he returns.  Patient is currently boarding, and to be discharged, by ER provider when he returns  Risk Prescription drug management.    Final Clinical Impression(s) / ED Diagnoses Final diagnoses:  Pneumonia of left lower lobe due to infectious organism  Hypervolemia, unspecified hypervolemia type    Rx / DC Orders ED Discharge Orders          Ordered    amoxicillin-clavulanate (AUGMENTIN) 250-125 MG tablet  2 times daily,   Status:  Discontinued        04/01/23 0851    azithromycin (ZITHROMAX) 250 MG tablet  Daily,   Status:  Discontinued        04/01/23 0851    azithromycin (ZITHROMAX) 250 MG tablet  Daily        04/01/23 0918    amoxicillin-clavulanate (AUGMENTIN) 250-125 MG tablet  2 times daily,   Status:  Discontinued        04/01/23 0918    amoxicillin (AMOXIL) 500 MG capsule  2 times daily        04/01/23 0921              Kamsiyochukwu Spickler, Harley Alto, PA 04/01/23 1355    Virgina Norfolk, DO 04/01/23 1455

## 2023-04-01 NOTE — ED Notes (Signed)
Pt continues to sit in the w/c beside the stretcher in the hallway with a blanket over his head refusing to get into the bed.

## 2023-04-01 NOTE — ED Notes (Addendum)
Pt refused vitals 

## 2023-04-01 NOTE — ED Notes (Signed)
Pt still refusing vs

## 2023-04-01 NOTE — Progress Notes (Signed)
Asked to see patient for dialysis. Patient is not being admitted. Plan will be for "ED HD". Pt will go to the dialysis unit upstairs when they are ready for the pt. When dialysis is completed pt will be sent back to ED for reassessment.   Pt seen briefly in ED.  Chest mild crackles L base, R clear, bilat 2-3+ pretib edema of LE's. On RA sitting up not in distress. CXR shows vasc congestion, possibly early edema  OP HD: TTS [facility]  4h   400/800    AVG   Heparin 2000   Rob Arlean Hopping  MD  [facility] 04/01/2023, 11:04 AM  Recent Labs  Lab 03/30/23 0049 03/30/23 2059 03/31/23 1909  HGB 9.7* 11.2* 10.0*  CALCIUM 7.6*  --  7.4*  CREATININE 15.23* 17.30* 15.74*  K 4.3 4.5 4.6    Inpatient medications:  amoxicillin-clavulanate  1 tablet Oral Once   azithromycin  500 mg Oral Once

## 2023-04-01 NOTE — ED Notes (Signed)
Pt transported to Dialysis ?

## 2023-04-01 NOTE — Discharge Instructions (Signed)
You have findings of pneumonia, please take antibiotics as prescribed.  Is very important that you take these antibiotics, or you may become very sick, including become short of breath, have a fever, or develop an infection of your blood.  Today you are well-appearing, but if you do not take the medication, your condition will likely worsen.

## 2023-04-01 NOTE — Procedures (Signed)
I have reviewed the HD regimen and made appropriate changes.  Vinson Moselle MD  [facility] 04/01/2023, 2:22 PM

## 2023-04-01 NOTE — ED Notes (Signed)
Patient transported to X-ray 

## 2023-04-01 NOTE — Progress Notes (Signed)
Was called over to help with bleeding access. Pt was argumentive when trying to get him to cooperate with his arm positioning to stop bleeding. Pt arterial bled for 40 minutes post tx. Venous bled for 30 minutes post tx. Pt started cramping 30 post being off the machine. Pt educated on not missing dialysis stated transportation issue for outpatient HD. Advised pt to speak case worker at center to assist with outpatient transportation

## 2023-04-01 NOTE — ED Provider Notes (Signed)
Patient seen earlier today.  He underwent dialysis.  He is feeling greatly improved. Physical Exam  BP (!) 169/88 (BP Location: Left Arm)   Pulse (!) 101   Temp 99.1 F (37.3 C)   Resp 18   SpO2 100%   Physical Exam  Procedures  Procedures  ED Course / MDM    Medical Decision Making  Patient has returned.  No hypoxia.  Given his antibiotic medication. Safe for discharge        Arthor Captain, Cordelia Poche 04/01/23 2101    Royanne Foots, DO 04/06/23 1819

## 2023-04-01 NOTE — ED Notes (Signed)
Patient is in wheel chair. He refused to get out to the stretcher. He does not appear to be in any distress. Not cooperative with staff.

## 2023-04-01 NOTE — ED Notes (Signed)
Pt continuing to refuse vital signs. Pt AAOx4. Pt in no acute distress.

## 2023-04-01 NOTE — ED Notes (Signed)
Pt refused to leave the bed upon discharge. This RN and Jesse Sans explained to pt that he has been medically cleared by a provider and is cleared to be discharged. Pt refused. Security was called and needed to assist pt to the lobby. Pt refused vital signs. Pt was in no acute distress upon time of discharge. A&Ox4.

## 2023-04-01 NOTE — ED Notes (Signed)
Pt fixed & ate oatmeal with his out bowl/supplies in the hallway.

## 2023-04-01 NOTE — ED Notes (Signed)
RN attempted to obtain pt's vital signs. Pt refused. RN attempted to educate pt on importance of vital signs. Pt continued to refuse and became verbally aggressive.

## 2023-04-01 NOTE — Progress Notes (Signed)
Received patient in bed to unit from ED.  Alert and oriented.  Informed consent signed and in chart.   TX duration: 3:15  Patient tolerated well. Had some leg cramps, which resolved with paused UF, then lower goal.  Transported back to the ED.  Alert, without acute distress.  Hand-off given to patient's nurse.   Access used: R AVG Access issues: prolonged bleeding when deaccessed.   Total UF removed: 2.9L  04/01/23 1808  Vitals  Temp 99.1 F (37.3 C)  BP (!) 165/90  BP Location Left Arm  BP Method Automatic  Patient Position (if appropriate) Lying  Pulse Rate (!) 101  Pulse Rate Source Monitor  Resp (!) 27  Oxygen Therapy  SpO2 100 %  O2 Device Room Air  Patient Activity (if Appropriate) In bed  Pulse Oximetry Type Continuous  During Treatment Monitoring  Intra-Hemodialysis Comments Tx completed  Dialysis Fluid Bolus Normal Saline  Bolus Amount (mL) 300 mL  Post Treatment  Dialyzer Clearance Lightly streaked  Liters Processed 73.9  Fluid Removed (mL) 2.8 mL  Tolerated HD Treatment Yes (reduced UF due to cramping)    Medication(s) given: none

## 2023-04-01 NOTE — ED Notes (Signed)
Pt refused to take the blanket off his head & take the PO meds he was offered. Each time this RN removed the blanket he would say "Stop I'm cold."

## 2023-04-02 ENCOUNTER — Other Ambulatory Visit: Payer: Self-pay

## 2023-04-04 ENCOUNTER — Other Ambulatory Visit: Payer: Self-pay

## 2023-04-11 ENCOUNTER — Emergency Department (HOSPITAL_BASED_OUTPATIENT_CLINIC_OR_DEPARTMENT_OTHER): Payer: 59

## 2023-04-11 ENCOUNTER — Other Ambulatory Visit: Payer: Self-pay

## 2023-04-11 ENCOUNTER — Inpatient Hospital Stay (HOSPITAL_COMMUNITY)
Admission: EM | Admit: 2023-04-11 | Discharge: 2023-04-17 | DRG: 628 | Disposition: A | Discharge status: Home / Self Care | Payer: 59 | Attending: Internal Medicine | Admitting: Internal Medicine

## 2023-04-11 ENCOUNTER — Emergency Department (HOSPITAL_COMMUNITY): Payer: 59

## 2023-04-11 DIAGNOSIS — R188 Other ascites: Secondary | ICD-10-CM | POA: Diagnosis present

## 2023-04-11 DIAGNOSIS — T82868A Thrombosis of vascular prosthetic devices, implants and grafts, initial encounter: Secondary | ICD-10-CM | POA: Diagnosis not present

## 2023-04-11 DIAGNOSIS — E1169 Type 2 diabetes mellitus with other specified complication: Secondary | ICD-10-CM | POA: Diagnosis present

## 2023-04-11 DIAGNOSIS — I120 Hypertensive chronic kidney disease with stage 5 chronic kidney disease or end stage renal disease: Secondary | ICD-10-CM | POA: Diagnosis not present

## 2023-04-11 DIAGNOSIS — E785 Hyperlipidemia, unspecified: Secondary | ICD-10-CM | POA: Diagnosis present

## 2023-04-11 DIAGNOSIS — I132 Hypertensive heart and chronic kidney disease with heart failure and with stage 5 chronic kidney disease, or end stage renal disease: Secondary | ICD-10-CM | POA: Diagnosis present

## 2023-04-11 DIAGNOSIS — T82818A Embolism of vascular prosthetic devices, implants and grafts, initial encounter: Secondary | ICD-10-CM | POA: Diagnosis present

## 2023-04-11 DIAGNOSIS — R0989 Other specified symptoms and signs involving the circulatory and respiratory systems: Secondary | ICD-10-CM | POA: Diagnosis not present

## 2023-04-11 DIAGNOSIS — Z794 Long term (current) use of insulin: Secondary | ICD-10-CM

## 2023-04-11 DIAGNOSIS — Z882 Allergy status to sulfonamides status: Secondary | ICD-10-CM

## 2023-04-11 DIAGNOSIS — E8779 Other fluid overload: Principal | ICD-10-CM | POA: Diagnosis present

## 2023-04-11 DIAGNOSIS — Z811 Family history of alcohol abuse and dependence: Secondary | ICD-10-CM

## 2023-04-11 DIAGNOSIS — I10 Essential (primary) hypertension: Secondary | ICD-10-CM | POA: Diagnosis not present

## 2023-04-11 DIAGNOSIS — R1110 Vomiting, unspecified: Secondary | ICD-10-CM | POA: Diagnosis not present

## 2023-04-11 DIAGNOSIS — M898X9 Other specified disorders of bone, unspecified site: Secondary | ICD-10-CM | POA: Diagnosis present

## 2023-04-11 DIAGNOSIS — R1900 Intra-abdominal and pelvic swelling, mass and lump, unspecified site: Secondary | ICD-10-CM | POA: Diagnosis not present

## 2023-04-11 DIAGNOSIS — R1111 Vomiting without nausea: Secondary | ICD-10-CM | POA: Diagnosis not present

## 2023-04-11 DIAGNOSIS — M96841 Postprocedural hematoma of a musculoskeletal structure following other procedure: Secondary | ICD-10-CM | POA: Diagnosis not present

## 2023-04-11 DIAGNOSIS — Z634 Disappearance and death of family member: Secondary | ICD-10-CM

## 2023-04-11 DIAGNOSIS — D638 Anemia in other chronic diseases classified elsewhere: Secondary | ICD-10-CM | POA: Diagnosis present

## 2023-04-11 DIAGNOSIS — E875 Hyperkalemia: Secondary | ICD-10-CM | POA: Diagnosis not present

## 2023-04-11 DIAGNOSIS — Z91199 Patient's noncompliance with other medical treatment and regimen due to unspecified reason: Secondary | ICD-10-CM

## 2023-04-11 DIAGNOSIS — R918 Other nonspecific abnormal finding of lung field: Secondary | ICD-10-CM | POA: Diagnosis present

## 2023-04-11 DIAGNOSIS — E119 Type 2 diabetes mellitus without complications: Secondary | ICD-10-CM

## 2023-04-11 DIAGNOSIS — Z8249 Family history of ischemic heart disease and other diseases of the circulatory system: Secondary | ICD-10-CM

## 2023-04-11 DIAGNOSIS — Z5900 Homelessness unspecified: Secondary | ICD-10-CM

## 2023-04-11 DIAGNOSIS — Z992 Dependence on renal dialysis: Secondary | ICD-10-CM

## 2023-04-11 DIAGNOSIS — K130 Diseases of lips: Secondary | ICD-10-CM | POA: Diagnosis present

## 2023-04-11 DIAGNOSIS — Z8052 Family history of malignant neoplasm of bladder: Secondary | ICD-10-CM

## 2023-04-11 DIAGNOSIS — T82511A Breakdown (mechanical) of surgically created arteriovenous shunt, initial encounter: Secondary | ICD-10-CM

## 2023-04-11 DIAGNOSIS — I447 Left bundle-branch block, unspecified: Secondary | ICD-10-CM | POA: Diagnosis present

## 2023-04-11 DIAGNOSIS — Z91158 Patient's noncompliance with renal dialysis for other reason: Secondary | ICD-10-CM

## 2023-04-11 DIAGNOSIS — G4700 Insomnia, unspecified: Secondary | ICD-10-CM | POA: Diagnosis present

## 2023-04-11 DIAGNOSIS — F419 Anxiety disorder, unspecified: Secondary | ICD-10-CM | POA: Diagnosis present

## 2023-04-11 DIAGNOSIS — Z79899 Other long term (current) drug therapy: Secondary | ICD-10-CM

## 2023-04-11 DIAGNOSIS — T82898A Other specified complication of vascular prosthetic devices, implants and grafts, initial encounter: Secondary | ICD-10-CM

## 2023-04-11 DIAGNOSIS — Z5982 Transportation insecurity: Secondary | ICD-10-CM

## 2023-04-11 DIAGNOSIS — D631 Anemia in chronic kidney disease: Secondary | ICD-10-CM | POA: Diagnosis present

## 2023-04-11 DIAGNOSIS — I152 Hypertension secondary to endocrine disorders: Secondary | ICD-10-CM | POA: Diagnosis present

## 2023-04-11 DIAGNOSIS — N186 End stage renal disease: Principal | ICD-10-CM

## 2023-04-11 DIAGNOSIS — R079 Chest pain, unspecified: Secondary | ICD-10-CM | POA: Diagnosis present

## 2023-04-11 DIAGNOSIS — R609 Edema, unspecified: Secondary | ICD-10-CM | POA: Diagnosis not present

## 2023-04-11 DIAGNOSIS — Z7982 Long term (current) use of aspirin: Secondary | ICD-10-CM

## 2023-04-11 DIAGNOSIS — R45851 Suicidal ideations: Secondary | ICD-10-CM | POA: Diagnosis present

## 2023-04-11 DIAGNOSIS — R5381 Other malaise: Secondary | ICD-10-CM | POA: Diagnosis present

## 2023-04-11 DIAGNOSIS — E1122 Type 2 diabetes mellitus with diabetic chronic kidney disease: Secondary | ICD-10-CM | POA: Diagnosis present

## 2023-04-11 DIAGNOSIS — Y832 Surgical operation with anastomosis, bypass or graft as the cause of abnormal reaction of the patient, or of later complication, without mention of misadventure at the time of the procedure: Secondary | ICD-10-CM | POA: Diagnosis present

## 2023-04-11 DIAGNOSIS — I517 Cardiomegaly: Secondary | ICD-10-CM | POA: Diagnosis not present

## 2023-04-11 DIAGNOSIS — I5022 Chronic systolic (congestive) heart failure: Secondary | ICD-10-CM | POA: Diagnosis present

## 2023-04-11 LAB — CBC
HCT: 35.3 % — ABNORMAL LOW (ref 39.0–52.0)
Hemoglobin: 10.9 g/dL — ABNORMAL LOW (ref 13.0–17.0)
MCH: 25.6 pg — ABNORMAL LOW (ref 26.0–34.0)
MCHC: 30.9 g/dL (ref 30.0–36.0)
MCV: 82.9 fL (ref 80.0–100.0)
Platelets: 454 10*3/uL — ABNORMAL HIGH (ref 150–400)
RBC: 4.26 MIL/uL (ref 4.22–5.81)
RDW: 19.6 % — ABNORMAL HIGH (ref 11.5–15.5)
WBC: 6.9 10*3/uL (ref 4.0–10.5)
nRBC: 0 % (ref 0.0–0.2)

## 2023-04-11 LAB — CBG MONITORING, ED: Glucose-Capillary: 136 mg/dL — ABNORMAL HIGH (ref 70–99)

## 2023-04-11 LAB — BASIC METABOLIC PANEL
Anion gap: 23 — ABNORMAL HIGH (ref 5–15)
BUN: 108 mg/dL — ABNORMAL HIGH (ref 6–20)
CO2: 18 mmol/L — ABNORMAL LOW (ref 22–32)
Calcium: 7.8 mg/dL — ABNORMAL LOW (ref 8.9–10.3)
Chloride: 97 mmol/L — ABNORMAL LOW (ref 98–111)
Creatinine, Ser: 18.37 mg/dL — ABNORMAL HIGH (ref 0.61–1.24)
GFR, Estimated: 3 mL/min — ABNORMAL LOW (ref >60)
Glucose, Bld: 113 mg/dL — ABNORMAL HIGH (ref 70–99)
Potassium: 4.3 mmol/L (ref 3.5–5.1)
Sodium: 138 mmol/L (ref 135–145)

## 2023-04-11 LAB — TROPONIN I (HIGH SENSITIVITY)
Troponin I (High Sensitivity): 301 ng/L (ref <18)
Troponin I (High Sensitivity): 250 ng/L (ref <18)

## 2023-04-11 LAB — PROTIME-INR
INR: 1.3 — ABNORMAL HIGH (ref 0.8–1.2)
Prothrombin Time: 16.1 s — ABNORMAL HIGH (ref 11.4–15.2)

## 2023-04-11 NOTE — ED Notes (Signed)
Attempted to call report and RN refused to take report. Asked me to Give handoff to next nurse and call back later.

## 2023-04-11 NOTE — ED Provider Triage Note (Signed)
Emergency Medicine Provider Triage Evaluation Note  SELIM TORUNO , a 52 y.o. male with a history of ESRD was evaluated in triage.  Pt complains of generalized weakness.  Also feels like his fistula may not be working.  Last had dialysis last Tuesday.  Also complaining of chest pain for 2 weeks.  Review of Systems  Positive: See above Negative: See above  Physical Exam  BP (!) 178/104 (BP Location: Left Arm)   Pulse 79   Resp 16   SpO2 98%  Gen:   Awake, no distress   Resp:  Normal effort  MSK:   Moves extremities without difficulty  Other:  No bruit or thrill from fistula in right upper extremity  Medical Decision Making  Medically screening exam initiated at 1:26 PM.  Appropriate orders placed.  NAPOLEAN SIDMAN was informed that the remainder of the evaluation will be completed by another provider, this initial triage assessment does not replace that evaluation, and the importance of remaining in the ED until their evaluation is complete.   Rondel Baton, MD 04/11/23 [phone]

## 2023-04-11 NOTE — ED Provider Notes (Signed)
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   Rondel Baton, MD 04/11/23 [phone]

## 2023-04-11 NOTE — ED Notes (Signed)
ED TO INPATIENT HANDOFF REPORT  ED Nurse Name and Phone #: [phone] [name] Name/Age/Gender [name] 52 y.o. male Room/Bed: H018C/H018C  Code Status   Code Status: Prior  Home/SNF/Other Home Patient oriented to: self, place, time, and situation Is this baseline? Yes   Triage Complete: Triage complete  Chief Complaint ESRD on hemodialysis (HCC) [N18.6, Z99.2]  Triage Note Pt BIB [facility] for [facility]argy.  Pt endorses missing 4 dialysis appts d/t transportation.  CP x 2 weeks.  Pt refused 12-lead for EMS.  Ascites and peripheral edema noted to lower extremeties per EMS.. PT endorses that fistula is not working.  EMS did not note bruit or thrill. Pt is not in any obvious distress at this time  176/96  94% 80 16  CBG 150   Allergies Allergies  Allergen Reactions   Sulfa Antibiotics Rash    Severe rash.  Severe rash.     Severe rash.   Other Other (See Comments)    Patient states he is allergic to the TB test - unknown reaction during childhood    Bactrim [Sulfamethoxazole-Trimethoprim] Rash   Cephalexin Other (See Comments) and Rash    Patient with severe drug reaction including exfoliating skin rash and hypotension after being prescribed both cephalexin and sulfamethoxazole-trimethoprim simultaneously. Favor SMX as much more likely culprit as patient had tolerated other cephalosporins in the past, but cannot say with complete certainty that this was not related to cephalexin.  Other Reaction(s): Other, Other (See Comments)  Patient with severe drug reaction including exfoliating skin rash and hypotension after being prescribed both cephalexin and sulfamethoxazole-trimethoprim simultaneously. Favor SMX as much more likely culprit as patient had tolerated other cephalosporins in the past, but cannot say with complete certainty that this was not related to cephalexin.   "Patient with severe drug reaction including exfoliating skin rash and hypotension  after being prescribed both cephalexin and sulfamethoxazole-trimethoprim simultaneously. Favor SMX as much more likely culprit as patient had tolerated other cephalosporins in the past, but cannot say with complete certainty that this was not related to cephalexin." As noted in [facility]   "Patient with severe drug reaction including exfoliating skin rash and hypotension after being prescribed both cephalexin and sulfamethoxazole-trimethoprim simultaneously. Favor SMX as much more likely culprit as patient had tolerated other cephalosporins in the past, but cannot say with complete certainty that this was not related to cephalexin." As noted in [facility]   "Patient with severe drug reaction including exfoliating skin rash and hypotension after being prescribed both cephalexin and sulfamethoxazole-trimethoprim simultaneously. Favor SMX as much more likely culprit as patient had tolerated other cephalosporins in the past, but cannot say with complete certainty that this was not related to cephalexin." As noted in [facility]     Patient with severe drug reaction including exfoliating skin rash and hypotension after being prescribed both cephalexin and sulfamethoxazole-trimethoprim simultaneously. Favor SMX as much more likely culprit as patient had tolerated other cephalosporins in the past, but cannot say with complete certainty that this was not related to cephalexin.  "Patient with severe drug reaction including exfoliating skin rash and hypotension after being prescribed both cephalexin and sulfamethoxazole-trimethoprim simultaneously. Favor SMX as much more likely culprit as patient had tolerated other cephalosporins in the past, but cannot say with complete certainty that this was not related to cephalexin." As noted in [facility]  "Patient with severe drug reaction including exfoliating skin rash and hypotension after being prescribed both cephalexin and sulfamethoxazole-trimethoprim  simultaneously.  Favor SMX as much more likely culprit as patient had tolerated other cephalosporins in the past, but cannot say with complete certainty that this was not related to cephalexin." As noted in [facility]     Patient with severe drug reaction inc... (TRUNCATED)    Level of Care/Admitting Diagnosis ED Disposition     ED Disposition  Admit   Condition  --   Comment  Hospital Area: [facility] [[id number]]  Level of Care: Med-Surg [16]  May place patient in observation at [facility] or [facility] if equivalent level of care is available:: No  Covid Evaluation: Asymptomatic - no recent exposure (last 10 days) testing not required  Diagnosis: ESRD on hemodialysis [facility]) [[id number]]  Admitting Physician: Charlsie Quest [[id number]]  Attending Physician: Charlsie Quest [[id number]]          B Medical/Surgery History Past Medical History:  Diagnosis Date   Anxiety 02/2019   Anxiety 10/2019   Diabetes mellitus type II, uncontrolled 07/17/2006   Elevated alkaline phosphatase level 01/2019   ESRD on hemodialysis (HCC)    tts HD   ESSENTIAL HYPERTENSION 07/17/2006   Homelessness 10/13/2021   HYPERLIPIDEMIA 11/24/2008   Insomnia 02/2019   Left bundle branch block 02/22/2019   Lesion of lip 10/2019   Pneumonia 10/22/2011   Suicidal ideations 02/2019   Vitamin D deficiency 01/2019   Past Surgical History:  Procedure Laterality Date   ARM SURGERY     AV FISTULA PLACEMENT Right 02/20/2021   Procedure: RIGHT ARTERIOVENOUS GRAFT CREATION;  Surgeon: Chuck Hint, MD;  Location: [facility] OR;  Service: Vascular;  Laterality: Right;   INSERTION OF DIALYSIS CATHETER Left 02/20/2021   Procedure: INSERTION OF DIALYSIS CATHETER USING PALINDROME CATHETER;  Surgeon: Chuck Hint, MD;  Location: [facility] OR;  Service: Vascular;  Laterality: Left;   IR AV DIALY SHUNT INTRO NEEDLE/INTRACATH INITIAL W/PTA/IMG RIGHT Right 11/06/2021   IR FLUORO GUIDE CV LINE RIGHT  09/12/2022   IR REMOVAL  TUN CV CATH W/O FL  07/27/2021   IR THROMBECTOMY AV FISTULA W/THROMBOLYSIS INC/SHUNT/IMG RIGHT  09/11/2022   IR US GUIDE VASC ACCESS RIGHT  11/06/2021   IR US GUIDE VASC ACCESS RIGHT  09/11/2022   IR US GUIDE VASC ACCESS RIGHT  09/12/2022   TEE WITHOUT CARDIOVERSION N/A 10/25/2022   Procedure: TRANSESOPHAGEAL ECHOCARDIOGRAM;  Surgeon: Chilton Si, MD;  Location: [facility] INVASIVE CV LAB;  Service: Cardiovascular;  Laterality: N/A;   ULTRASOUND GUIDANCE FOR VASCULAR ACCESS  02/20/2021   Procedure: ULTRASOUND GUIDANCE FOR VASCULAR ACCESS;  Surgeon: Chuck Hint, MD;  Location: [facility] OR;  Service: Vascular;;     A IV Location/Drains/Wounds Patient Lines/Drains/Airways Status     Active Line/Drains/Airways     Name Placement date Placement time Site Days   Peripheral IV 04/11/23 22 G 1.75" Anterior;Left Forearm 04/11/23  1849  Forearm  less than 1   Fistula / Graft Right Forearm --  --  Forearm  --   Wound / Incision (Open or Dehisced) 09/12/22 Skin tear Elbow Left 09/12/22  --  Elbow  211   Wound / Incision (Open or Dehisced) 01/31/23 Other (Comment) Knee Anterior;Right round open area 01/31/23  1300  Knee  70   Wound / Incision (Open or Dehisced) 01/31/23 Other (Comment) Knee Anterior;Left round open area 01/31/23  1300  Knee  70            Intake/Output Last 24 hours No intake or output data in the  24 hours ending 04/11/23 2304  Labs/Imaging Results for orders placed or performed during the hospital encounter of 04/11/23 (from the past 48 hour(s))  CBG monitoring, ED     Status: Abnormal   Collection Time: 04/11/23  1:14 PM  Result Value Ref Range   Glucose-Capillary 136 (H) 70 - 99 mg/dL    Comment: Glucose reference range applies only to samples taken after fasting for at least 8 hours.  Basic metabolic panel     Status: Abnormal   Collection Time: 04/11/23  1:29 PM  Result Value Ref Range   Sodium 138 135 - 145 mmol/L   Potassium 4.3 3.5 - 5.1 mmol/L   Chloride 97 (L) 98  - 111 mmol/L   CO2 18 (L) 22 - 32 mmol/L   Glucose, Bld 113 (H) 70 - 99 mg/dL    Comment: Glucose reference range applies only to samples taken after fasting for at least 8 hours.   BUN 108 (H) 6 - 20 mg/dL   Creatinine, Ser 11.91 (H) 0.61 - 1.24 mg/dL   Calcium 7.8 (L) 8.9 - 10.3 mg/dL   GFR, Estimated 3 (L) >60 mL/min    Comment: (NOTE) Calculated using the CKD-EPI Creatinine Equation (2021)    Anion gap 23 (H) 5 - 15    Comment: Performed at [facility], [address] [place], [place] [address]  CBC     Status: Abnormal   Collection Time: 04/11/23  1:29 PM  Result Value Ref Range   WBC 6.9 4.0 - 10.5 K/uL   RBC 4.26 4.22 - 5.81 MIL/uL   Hemoglobin 10.9 (L) 13.0 - 17.0 g/dL   HCT 56.2 (L) 13.0 - 86.5 %   MCV 82.9 80.0 - 100.0 fL   MCH 25.6 (L) 26.0 - 34.0 pg   MCHC 30.9 30.0 - 36.0 g/dL   RDW 78.4 (H) 69.6 - 29.5 %   Platelets 454 (H) 150 - 400 K/uL   nRBC 0.0 0.0 - 0.2 %    Comment: Performed at [facility], [address] [place], [place] [address]  Troponin I (High Sensitivity)     Status: Abnormal   Collection Time: 04/11/23  1:29 PM  Result Value Ref Range   Troponin I (High Sensitivity) 301 (HH) <18 ng/L    Comment: CRITICAL RESULT CALLED TO, READ BACK BY AND VERIFIED WITH Aurther Loft RN 04/11/2023 1841 BNUNNERY (NOTE) Elevated high sensitivity troponin I (hsTnI) values and significant  changes across serial measurements may suggest ACS but many other  chronic and acute conditions are known to elevate hsTnI results.  Refer to the "Links" section for chest pain algorithms and additional  guidance. Performed at [facility], [address] [place], [place] [address]   Protime-INR (order if Patient is taking Coumadin / Warfarin)     Status: Abnormal   Collection Time: 04/11/23  1:29 PM  Result Value Ref Range   Prothrombin Time 16.1 (H) 11.4 - 15.2 seconds   INR 1.3 (H) 0.8 - 1.2    Comment: (NOTE) INR goal varies based on device and disease  states. Performed at [facility], [address], [place], [place] [address]   Troponin I (High Sensitivity)     Status: Abnormal   Collection Time: 04/11/23  6:50 PM  Result Value Ref Range   Troponin I (High Sensitivity) 250 (HH) <18 ng/L    Comment: CRITICAL VALUE NOTED. VALUE IS CONSISTENT WITH PREVIOUSLY REPORTED/CALLED VALUE (NOTE) Elevated  high sensitivity troponin I (hsTnI) values and significant  changes across serial measurements may suggest ACS but many other  chronic and acute conditions are known to elevate hsTnI results.  Refer to the "Links" section for chest pain algorithms and additional  guidance. Performed at [facility], [address] [place], [place] [address]    VAS US DUPLEX DIALYSIS ACCESS (AVF, AVG)  Result Date: 04/11/2023 DIALYSIS ACCESS Patient Name:  [name]  Date of Exam:   04/11/2023 Medical Rec #: [id number]        Accession #:    [id number] Date of Birth: [birth year]       Patient Gender: M Patient Age:   69 years Exam Location:  [facility] Procedure:      VAS US DUPLEX DIALYSIS ACCESS (AVF, AVG) Referring Phys: Molly Maduro PATERSON --------------------------------------------------------------------------------  Reason for Exam: No palpable thrill for AVF/AVG. Access Site: Right Upper Extremity. Access Type: Forearm loop AVG. History: 02/20/2021 RT AVG creation. Comparison Study: Previous exam on 10/24/2022 showed patent graft Performing Technologist: Ernestene Mention RVT, RDMS  Examination Guidelines: A complete evaluation includes B-mode imaging, spectral Doppler, color Doppler, and power Doppler as needed of all accessible portions of each vessel. Unilateral testing is considered an integral part of a complete examination. Limited examinations for reoccurring indications may be performed as noted.  Findings:   +--------------------+----------+-----------------+--------+ AVG                 PSV (cm/s)Flow Vol (mL/min)Describe  +--------------------+----------+-----------------+--------+ Native artery inflow    75                              +--------------------+----------+-----------------+--------+ Arterial anastomosis                           occluded +--------------------+----------+-----------------+--------+ Prox graft                                     occluded +--------------------+----------+-----------------+--------+ Mid graft                                      occluded +--------------------+----------+-----------------+--------+ Distal graft                                   occluded +--------------------+----------+-----------------+--------+  Summary: RT AVG appears occluded.  *See table(s) above for measurements and observations.  Suggest Peripheral Vascular Consult. Diagnosing physician: Gerarda Fraction Electronically signed by Gerarda Fraction on 04/11/2023 at 3:42:01 PM.    --------------------------------------------------------------------------------   Final    DG Chest 2 View  Result Date: 04/11/2023 CLINICAL DATA:  Chest pain. EXAM: CHEST - 2 VIEW COMPARISON:  April 01, 2023. FINDINGS: Stable cardiomegaly. Mild central pulmonary vascular congestion is noted. Stable bilateral pulmonary nodules are noted in the left perihilar and right basilar regions. Old distal left clavicular fracture is noted. IMPRESSION: Stable cardiomegaly with central pulmonary vascular congestion. Stable bilateral pulmonary nodules are noted. These are concerning for possible neoplasm, and CT scan of the chest is recommended for further evaluation. Electronically Signed   By: Lupita Raider M.D.   On: 04/11/2023 15:08    Pending Labs Unresulted Labs (From admission, onward)  None       Vitals/Pain Today's Vitals   04/11/23 1314 04/11/23 1800  BP: (!) 178/104 (!) 177/109  Pulse: 79 80  Resp: 16 20  Temp:  97.6 F (36.4 C)  TempSrc:  Oral  SpO2: 98% 100%  PainSc:  8     Isolation  Precautions No active isolations  Medications Medications - No data to display  Mobility walks     Focused Assessments   R Recommendations: See Admitting Provider Note  Report given to:   Additional Notes: refusing vitals with current RN. Refusing care. Non compliant. Uncooperative.

## 2023-04-11 NOTE — Progress Notes (Signed)
RUE dialysis access duplex has been completed.  Preliminary results given to Dr. Eloise Harman.    Results can be found under chart review under CV PROC. 04/11/2023 3:18 PM Waniya Hoglund RVT, RDMS

## 2023-04-11 NOTE — ED Notes (Signed)
Pt. Refusing VS.

## 2023-04-11 NOTE — ED Notes (Addendum)
This RN and Arline Asp, RN attempted PIV access without success (3 total attempts); unable to obtain troponin. Stat IV team consult placed.

## 2023-04-11 NOTE — H&P (Signed)
History and Physical    [name] UXL:[id number] DOB: March 04, 1971 DOA: 04/11/2023  PCP: [name]  Patient coming from: Home  I have personally briefly reviewed patient's old medical records in [facility]  Chief Complaint: Missed dialysis  HPI: [name] is a 52 y.o. male with medical history significant for ESRD on TTS HD, chronic HFrEF (EF 20-25%), insulin-dependent T2DM, HTN, anemia of chronic disease who presented to the ED for fatigue and missed dialysis.  ***  ED Course  Labs/Imaging on admission: I have personally reviewed following labs and imaging studies.  Initial vitals showed BP 177/109, pulse 80, RR 20, temp 97.6 F, SpO2 100% on room air.  Labs showed sodium 138, potassium 4.3, bicarb 18, BUN 108, creatinine 18.37, serum glucose 113, WBC 6.9, hemoglobin 10.9, platelets 454,000, troponin 301 > 250.  2 view chest x-ray showed stable cardiomegaly with central pulmonary vascular congestion.  Stable bilateral pulmonary nodules noted.  These are concerning for possible neoplasm per radiology read.  RUE dialysis access duplex showed patent right AV graft appears occluded.  EDP spoke with nephrology, Dr. Ronalee Belts, who recommended admission and consult to IR for intervention.  EDP spoke with IR Dr. Archer Asa who stated that IR can place tunneled catheter if emergent dialysis needed.  EDP also spoke with vascular surgery Dr. Sherral Hammers who advised their team could not perform any procedure over the weekend due to limited OR time.  The hospitalist service was consulted to admit.  Review of Systems: All systems reviewed and are negative except as documented in history of present illness above.   Past Medical History:  Diagnosis Date   Anxiety 02/2019   Anxiety 10/2019   Diabetes mellitus type II, uncontrolled 07/17/2006   Elevated alkaline phosphatase level 01/2019   ESRD on hemodialysis (HCC)    tts HD   ESSENTIAL HYPERTENSION 07/17/2006   Homelessness  10/13/2021   HYPERLIPIDEMIA 11/24/2008   Insomnia 02/2019   Left bundle branch block 02/22/2019   Lesion of lip 10/2019   Pneumonia 10/22/2011   Suicidal ideations 02/2019   Vitamin D deficiency 01/2019    Past Surgical History:  Procedure Laterality Date   ARM SURGERY     AV FISTULA PLACEMENT Right 02/20/2021   Procedure: RIGHT ARTERIOVENOUS GRAFT CREATION;  Surgeon: Chuck Hint, MD;  Location: [facility] OR;  Service: Vascular;  Laterality: Right;   INSERTION OF DIALYSIS CATHETER Left 02/20/2021   Procedure: INSERTION OF DIALYSIS CATHETER USING PALINDROME CATHETER;  Surgeon: Chuck Hint, MD;  Location: [facility] OR;  Service: Vascular;  Laterality: Left;   IR AV DIALY SHUNT INTRO NEEDLE/INTRACATH INITIAL W/PTA/IMG RIGHT Right 11/06/2021   IR FLUORO GUIDE CV LINE RIGHT  09/12/2022   IR REMOVAL TUN CV CATH W/O FL  07/27/2021   IR THROMBECTOMY AV FISTULA W/THROMBOLYSIS INC/SHUNT/IMG RIGHT  09/11/2022   IR US GUIDE VASC ACCESS RIGHT  11/06/2021   IR US GUIDE VASC ACCESS RIGHT  09/11/2022   IR US GUIDE VASC ACCESS RIGHT  09/12/2022   TEE WITHOUT CARDIOVERSION N/A 10/25/2022   Procedure: TRANSESOPHAGEAL ECHOCARDIOGRAM;  Surgeon: Chilton Si, MD;  Location: [facility] INVASIVE CV LAB;  Service: Cardiovascular;  Laterality: N/A;   ULTRASOUND GUIDANCE FOR VASCULAR ACCESS  02/20/2021   Procedure: ULTRASOUND GUIDANCE FOR VASCULAR ACCESS;  Surgeon: Chuck Hint, MD;  Location: [facility] OR;  Service: Vascular;;    Social History:  reports that he has never smoked. He has never used smokeless tobacco. He reports that he does not drink  alcohol and does not use drugs.  Allergies  Allergen Reactions   Sulfa Antibiotics Rash    Severe rash.  Severe rash.     Severe rash.   Other Other (See Comments)    Patient states he is allergic to the TB test - unknown reaction during childhood    Bactrim [Sulfamethoxazole-Trimethoprim] Rash   Cephalexin Other (See Comments) and Rash    Patient with  severe drug reaction including exfoliating skin rash and hypotension after being prescribed both cephalexin and sulfamethoxazole-trimethoprim simultaneously. Favor SMX as much more likely culprit as patient had tolerated other cephalosporins in the past, but cannot say with complete certainty that this was not related to cephalexin.  Other Reaction(s): Other, Other (See Comments)  Patient with severe drug reaction including exfoliating skin rash and hypotension after being prescribed both cephalexin and sulfamethoxazole-trimethoprim simultaneously. Favor SMX as much more likely culprit as patient had tolerated other cephalosporins in the past, but cannot say with complete certainty that this was not related to cephalexin.   "Patient with severe drug reaction including exfoliating skin rash and hypotension after being prescribed both cephalexin and sulfamethoxazole-trimethoprim simultaneously. Favor SMX as much more likely culprit as patient had tolerated other cephalosporins in the past, but cannot say with complete certainty that this was not related to cephalexin." As noted in [facility]   "Patient with severe drug reaction including exfoliating skin rash and hypotension after being prescribed both cephalexin and sulfamethoxazole-trimethoprim simultaneously. Favor SMX as much more likely culprit as patient had tolerated other cephalosporins in the past, but cannot say with complete certainty that this was not related to cephalexin." As noted in [facility]   "Patient with severe drug reaction including exfoliating skin rash and hypotension after being prescribed both cephalexin and sulfamethoxazole-trimethoprim simultaneously. Favor SMX as much more likely culprit as patient had tolerated other cephalosporins in the past, but cannot say with complete certainty that this was not related to cephalexin." As noted in [facility]     Patient with severe drug reaction including exfoliating skin rash and  hypotension after being prescribed both cephalexin and sulfamethoxazole-trimethoprim simultaneously. Favor SMX as much more likely culprit as patient had tolerated other cephalosporins in the past, but cannot say with complete certainty that this was not related to cephalexin.  "Patient with severe drug reaction including exfoliating skin rash and hypotension after being prescribed both cephalexin and sulfamethoxazole-trimethoprim simultaneously. Favor SMX as much more likely culprit as patient had tolerated other cephalosporins in the past, but cannot say with complete certainty that this was not related to cephalexin." As noted in [facility]  "Patient with severe drug reaction including exfoliating skin rash and hypotension after being prescribed both cephalexin and sulfamethoxazole-trimethoprim simultaneously. Favor SMX as much more likely culprit as patient had tolerated other cephalosporins in the past, but cannot say with complete certainty that this was not related to cephalexin." As noted in [facility]     Patient with severe drug reaction inc... (TRUNCATED)    Family History  Problem Relation Age of Onset   Hypertension Mother 57   Cancer Mother        bladder cancer   Alcohol abuse Father    Hypertension Brother      Prior to Admission medications   Medication Sig Start Date End Date Taking? Authorizing Provider  acetaminophen (TYLENOL) 500 MG tablet Take 1 tablet (500 mg total) by mouth every 8 (eight) hours as needed for mild pain. 05/28/22   Leroy Sea,  MD  amoxicillin (AMOXIL) 500 MG capsule Take 1 capsule (500 mg total) by mouth 2 (two) times daily. 04/01/23   Small, Brooke L, PA  ASPIRIN LOW DOSE 81 MG tablet Take 1 tablet (81 mg total) by mouth daily. 05/28/22   Leroy Sea, MD  azithromycin (ZITHROMAX) 250 MG tablet Take first 2 tablets by mouth together, then take 1 tablet every day until finished. 04/01/23   Small, Brooke L, PA  calcitRIOL (ROCALTROL) 0.25 MCG  capsule Take 1 capsule (0.25 mcg total) by mouth every Tuesday, Thursday, and Saturday at 6 PM. 10/26/22   Ghimire, Werner Lean, MD  calcium acetate (PHOSLO) 667 MG tablet Take 3 tablets (2,001 mg total) by mouth 3 (three) times daily. 03/06/23     carvedilol (COREG) 12.5 MG tablet Take 1 tablet by mouth twice a day with meals 03/06/23     doxycycline (VIBRA-TABS) 100 MG tablet Take 1 tablet (100 mg total) by mouth 2 (two) times daily. 03/21/23   Dione Booze, MD  glucose blood test strip Use to check fasting blood sugar once daily. 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 10/26/22   [provider]  hydrALAZINE (APRESOLINE) 25 MG tablet Take 1 tablet (25 mg total) by mouth 3 (three) times daily. 02/15/23   Zannie Cove, MD  insulin aspart (NOVOLOG FLEXPEN) 100 UNIT/ML FlexPen 0-9 Units, Subcutaneous, 3 times daily with meals CBG < 70: Implement Hypoglycemia measures CBG 70 - 120: 0 units CBG 121 - 150: 1 unit CBG 151 - 200: 2 units CBG 201 - 250: 3 units CBG 251 - 300: 5 units CBG 301 - 350: 7 units CBG 351 - 400: 9 units CBG > 400: call MD 10/26/22   Maretta Bees, MD  insulin glargine-yfgn (SEMGLEE) 100 UNIT/ML injection Inject 0.07 mLs (7 Units total) into the skin at bedtime. 10/26/22   Ghimire, Werner Lean, MD  Insulin Pen Needle 32G X 4 MM MISC Use with insulin pens 05/28/22   Leroy Sea, MD  isosorbide mononitrate (IMDUR) 60 MG 24 hr tablet Take 1 tablet (60 mg total) by mouth daily. 10/26/22   Ghimire, Werner Lean, MD  melatonin 3 MG TABS tablet Take 1 tablet (3 mg total) by mouth at bedtime as needed (insomnia). 10/26/22   Ghimire, Werner Lean, MD  mupirocin ointment (BACTROBAN) 2 % Apply 1 Application topically 3 (three) times daily. 01/07/23   Mardella Layman, MD  ondansetron (ZOFRAN-ODT) 4 MG disintegrating tablet Take 1 tablet (4 mg total) by mouth every 4 (four) hours as needed for nausea/vomiting.  03/05/23   Melene Plan, DO  PROTONIX 40 MG tablet Take 1 tablet (40 mg total) by mouth daily. 05/25/22   Leroy Sea, MD  simethicone (MYLICON) 80 MG chewable tablet Chew 1 tablet (80 mg total) by mouth 4 (four) times daily for 5 days. 11/22/22 04/11/23  Rodolph Bong, MD  gabapentin (NEURONTIN) 300 MG capsule Take 1 capsule (300 mg total) by mouth 3 (three) times daily. Patient not taking: Reported on 07/27/2019 07/29/18 08/30/19  Kallie Locks, FNP  sildenafil (VIAGRA) 25 MG tablet Take 1 tablet (25 mg total) by mouth as needed for erectile dysfunction. for erectile dysfunction. Patient not taking: Reported on 08/11/2020 10/13/19 11/27/20  Kallie Locks, FNP    Physical Exam: Vitals:   04/11/23 1314 04/11/23 1800  BP: (!) 178/104 (!) 177/109  Pulse: 79 80  Resp: 16 20  Temp:  97.6 F (36.4 C)  TempSrc:  Oral  SpO2: 98% 100%   *** Constitutional: NAD, calm, comfortable Eyes: PERRL, lids and conjunctivae normal ENMT: Mucous membranes are moist. Posterior pharynx clear of any exudate or lesions.Normal dentition.  Neck: normal, supple, no masses. Respiratory: clear to auscultation bilaterally, no wheezing, no crackles. Normal respiratory effort. No accessory muscle use.  Cardiovascular: Regular rate and rhythm, no murmurs / rubs / gallops. No extremity edema. 2+ pedal pulses. Abdomen: no tenderness, no masses palpated. No hepatosplenomegaly. Bowel sounds positive.  Musculoskeletal: no clubbing / cyanosis. No joint deformity upper and lower extremities. Good ROM, no contractures. Normal muscle tone.  Skin: no rashes, lesions, ulcers. No induration Neurologic: CN 2-12 grossly intact. Sensation intact. Strength 5/5 in all 4.  Psychiatric: Normal judgment and insight. Alert and oriented x 3. Normal mood.   EKG: Personally reviewed. Sinus rhythm, rate 78, LBBB.  Similar to prior.  Assessment/Plan Principal Problem:   ESRD on hemodialysis (HCC) Active Problems:   Clotted renal  dialysis AV graft, initial encounter (HCC)   Chronic heart failure with reduced ejection fraction (HFrEF, <= 40%) (HCC)   Insulin dependent type 2 diabetes mellitus (HCC)   Hypertension associated with diabetes (HCC)   Anemia of chronic disease   Pulmonary nodules   ONTARIO WIEBER is a 52 y.o. male with medical history significant for ESRD on TTS HD, chronic HFrEF (EF 20-25%), insulin-dependent T2DM, HTN, anemia of chronic disease who is admitted for dialysis needs and RUE AV graft occlusion. *** Assessment and Plan: ESRD on TTS HD RUE AVG occlusion: ***  Chronic HFrEF: ***  Insulin-dependent type 2 diabetes: ***  Hypertension: ***  Anemia of chronic disease: ***  Pulmonary nodules: Stable bilateral pulmonary nodules noted on CXR, concerning for possible neoplasm per radiology read.  Follow-up CT scan of the chest recommended.    DVT prophylaxis: ***  Code Status: ***  Family Communication: ***  Disposition Plan: ***  Consults called: EDP discussed with nephrology, IR, vascular surgery Severity of Illness: The appropriate patient status for this patient is OBSERVATION. Observation status is judged to be reasonable and necessary in order to provide the required intensity of service to ensure the patient's safety. The patient's presenting symptoms, physical exam findings, and initial radiographic and laboratory data in the context of their medical condition is felt to place them at decreased risk for further clinical deterioration. Furthermore, it is anticipated that the patient will be medically stable for discharge from the hospital within 2 midnights of admission.   Darreld Mclean MD [facility]  If 7PM-7AM, please contact night-coverage [email or web address]  04/11/2023, 11:12 PM

## 2023-04-11 NOTE — ED Notes (Signed)
Ambulated to restroom with steady even gait

## 2023-04-11 NOTE — ED Triage Notes (Addendum)
Pt BIB [facility] from [facility] for lethargy.  Pt endorses missing 4 dialysis appts d/t transportation.  CP x 2 weeks.  Pt refused 12-lead for EMS.  Ascites and peripheral edema noted to lower extremeties per EMS.. PT endorses that fistula is not working.  EMS did not note bruit or thrill. Pt is not in any obvious distress at this time  176/96  94% 80 16  CBG 150

## 2023-04-11 NOTE — ED Notes (Signed)
Pt. Refusing VS prior to leaving ED

## 2023-04-11 NOTE — Hospital Course (Signed)
[name] is a 52 y.o. male with medical history significant for ESRD on TTS HD, chronic HFrEF (EF 20-25%), insulin-dependent T2DM, HTN, anemia of chronic disease who is admitted for dialysis needs and RUE AV graft occlusion.

## 2023-04-11 NOTE — ED Provider Notes (Signed)
[place] EMERGENCY DEPARTMENT AT [facility] Provider Note   CSN: [id number] Arrival date & time: 04/11/23  1310     History No chief complaint on file.   [name] is a 52 y.o. male.  Patient with past medical history significant for ESRD, type 2 diabetes, hypertension, AV fistula occlusion presents to the emergency department with concerns of fatigue.  Patient reports that he has missed approximately 4 dialysis sessions in the last 10 days or so.  Endorsing chest pain for the last 2 weeks although patient refused EKG by EMS.  Does note some worsening fluid overload with lower extremity edema as well as some abdominal ascites.  States that his fistula is not working although he states that he has not been to dialysis due to transportation concerns.  EMS did not initially note any bruit or thrill on auscultation of AV fistula site.  Patient is not in any distress although is somewhat agitated.  HPI     Home Medications Prior to Admission medications   Medication Sig Start Date End Date Taking? Authorizing Provider  acetaminophen (TYLENOL) 500 MG tablet Take 1 tablet (500 mg total) by mouth every 8 (eight) hours as needed for mild pain. 05/28/22   Leroy Sea, MD  amoxicillin (AMOXIL) 500 MG capsule Take 1 capsule (500 mg total) by mouth 2 (two) times daily. 04/01/23   Small, Brooke L, PA  ASPIRIN LOW DOSE 81 MG tablet Take 1 tablet (81 mg total) by mouth daily. 05/28/22   Leroy Sea, MD  azithromycin (ZITHROMAX) 250 MG tablet Take first 2 tablets by mouth together, then take 1 tablet every day until finished. 04/01/23   Small, Brooke L, PA  calcitRIOL (ROCALTROL) 0.25 MCG capsule Take 1 capsule (0.25 mcg total) by mouth every Tuesday, Thursday, and Saturday at 6 PM. 10/26/22   Ghimire, Werner Lean, MD  calcium acetate (PHOSLO) 667 MG tablet Take 3 tablets (2,001 mg total) by mouth 3 (three) times daily. 03/06/23     carvedilol (COREG) 12.5 MG tablet Take 1 tablet by  mouth twice a day with meals 03/06/23     doxycycline (VIBRA-TABS) 100 MG tablet Take 1 tablet (100 mg total) by mouth 2 (two) times daily. 03/21/23   Dione Booze, MD  glucose blood test strip Use to check fasting blood sugar once daily. 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 10/26/22   [provider]  hydrALAZINE (APRESOLINE) 25 MG tablet Take 1 tablet (25 mg total) by mouth 3 (three) times daily. 02/15/23   Zannie Cove, MD  insulin aspart (NOVOLOG FLEXPEN) 100 UNIT/ML FlexPen 0-9 Units, Subcutaneous, 3 times daily with meals CBG < 70: Implement Hypoglycemia measures CBG 70 - 120: 0 units CBG 121 - 150: 1 unit CBG 151 - 200: 2 units CBG 201 - 250: 3 units CBG 251 - 300: 5 units CBG 301 - 350: 7 units CBG 351 - 400: 9 units CBG > 400: call MD 10/26/22   Maretta Bees, MD  insulin glargine-yfgn (SEMGLEE) 100 UNIT/ML injection Inject 0.07 mLs (7 Units total) into the skin at bedtime. 10/26/22   Ghimire, Werner Lean, MD  Insulin Pen Needle 32G X 4 MM MISC Use with insulin pens 05/28/22   Leroy Sea, MD  isosorbide mononitrate (IMDUR) 60 MG 24 hr tablet Take 1 tablet (60 mg total) by mouth daily. 10/26/22   Ghimire, [employer]  M, MD  melatonin 3 MG TABS tablet Take 1 tablet (3 mg total) by mouth at bedtime as needed (insomnia). 10/26/22   Ghimire, Werner Lean, MD  mupirocin ointment (BACTROBAN) 2 % Apply 1 Application topically 3 (three) times daily. 01/07/23   Mardella Layman, MD  ondansetron (ZOFRAN-ODT) 4 MG disintegrating tablet Take 1 tablet (4 mg total) by mouth every 4 (four) hours as needed for nausea/vomiting. 03/05/23   Melene Plan, DO  PROTONIX 40 MG tablet Take 1 tablet (40 mg total) by mouth daily. 05/25/22   Leroy Sea, MD  simethicone (MYLICON) 80 MG chewable tablet Chew 1 tablet (80 mg total) by mouth 4 (four) times daily for 5 days. 11/22/22 04/11/23  Rodolph Bong, MD  gabapentin (NEURONTIN)  300 MG capsule Take 1 capsule (300 mg total) by mouth 3 (three) times daily. Patient not taking: Reported on 07/27/2019 07/29/18 08/30/19  Kallie Locks, FNP  sildenafil (VIAGRA) 25 MG tablet Take 1 tablet (25 mg total) by mouth as needed for erectile dysfunction. for erectile dysfunction. Patient not taking: Reported on 08/11/2020 10/13/19 11/27/20  Kallie Locks, FNP      Allergies    Sulfa antibiotics, Other, Bactrim [sulfamethoxazole-trimethoprim], and Cephalexin    Review of Systems   Review of Systems  Neurological:  Positive for light-headedness.  All other systems reviewed and are negative.   Physical Exam Updated Vital Signs BP (!) 177/109 (BP Location: Left Arm)   Pulse 80   Temp 97.6 F (36.4 C) (Oral)   Resp 20   SpO2 100%  Physical Exam Vitals and nursing note reviewed.  Constitutional:      General: He is not in acute distress.    Appearance: Normal appearance. He is well-developed.  HENT:     Head: Normocephalic and atraumatic.  Eyes:     Conjunctiva/sclera: Conjunctivae normal.  Cardiovascular:     Rate and Rhythm: Normal rate and regular rhythm.     Heart sounds: No murmur heard.    Comments: No audible bruit or thrill in the right AV hemodialysis site. Pulmonary:     Effort: Pulmonary effort is normal. No respiratory distress.     Breath sounds: Normal breath sounds.  Abdominal:     Palpations: Abdomen is soft.     Tenderness: There is no abdominal tenderness.  Musculoskeletal:        General: No swelling.     Cervical back: Neck supple.     Right lower leg: 3+ Pitting Edema present.     Left lower leg: 3+ Pitting Edema present.  Skin:    General: Skin is warm and dry.     Capillary Refill: Capillary refill takes less than 2 seconds.  Neurological:     Mental Status: He is alert.  Psychiatric:        Mood and Affect: Mood normal.     ED Results / Procedures / Treatments   Labs (all labs ordered are listed, but only abnormal results are  displayed) Labs Reviewed  BASIC METABOLIC PANEL - Abnormal; Notable for the following components:      Result Value   Chloride 97 (*)    CO2 18 (*)    Glucose, Bld 113 (*)    BUN 108 (*)    Creatinine, Ser 18.37 (*)    Calcium 7.8 (*)    GFR, Estimated 3 (*)    Anion gap 23 (*)    All other components within normal limits  CBC - Abnormal; Notable  for the following components:   Hemoglobin 10.9 (*)    HCT 35.3 (*)    MCH 25.6 (*)    RDW 19.6 (*)    Platelets 454 (*)    All other components within normal limits  PROTIME-INR - Abnormal; Notable for the following components:   Prothrombin Time 16.1 (*)    INR 1.3 (*)    All other components within normal limits  CBG MONITORING, ED - Abnormal; Notable for the following components:   Glucose-Capillary 136 (*)    All other components within normal limits  TROPONIN I (HIGH SENSITIVITY) - Abnormal; Notable for the following components:   Troponin I (High Sensitivity) 301 (*)    All other components within normal limits  TROPONIN I (HIGH SENSITIVITY) - Abnormal; Notable for the following components:   Troponin I (High Sensitivity) 250 (*)    All other components within normal limits    EKG EKG Interpretation Date/Time:  Friday April 11 2023 13:03:44 EST Ventricular Rate:  78 PR Interval:  154 QRS Duration:  156 QT Interval:  496 QTC Calculation: 565 R Axis:   159  Text Interpretation: Normal sinus rhythm Biatrial enlargement Right axis deviation Non-specific intra-ventricular conduction block Abnormal ECG When compared with ECG of 31-Mar-2023 18:42, Abnormal right axis deviation NOW PRESENT Confirmed by Vonita Moss [phone]) on 04/11/2023 2:03:32 PM  Radiology VAS US DUPLEX DIALYSIS ACCESS (AVF, AVG)  Result Date: 04/11/2023 DIALYSIS ACCESS Patient Name:  GRANTLEY STAN  Date of Exam:   04/11/2023 Medical Rec #: [id number]        Accession #:    [id number] Date of Birth: [birth year]       Patient Gender: M Patient Age:    32 years Exam Location:  [facility] Procedure:      VAS US DUPLEX DIALYSIS ACCESS (AVF, AVG) Referring Phys: Molly Maduro PATERSON --------------------------------------------------------------------------------  Reason for Exam: No palpable thrill for AVF/AVG. Access Site: Right Upper Extremity. Access Type: Forearm loop AVG. History: 02/20/2021 RT AVG creation. Comparison Study: Previous exam on 10/24/2022 showed patent graft Performing Technologist: Ernestene Mention RVT, RDMS  Examination Guidelines: A complete evaluation includes B-mode imaging, spectral Doppler, color Doppler, and power Doppler as needed of all accessible portions of each vessel. Unilateral testing is considered an integral part of a complete examination. Limited examinations for reoccurring indications may be performed as noted.  Findings:   +--------------------+----------+-----------------+--------+ AVG                 PSV (cm/s)Flow Vol (mL/min)Describe +--------------------+----------+-----------------+--------+ Native artery inflow    75                              +--------------------+----------+-----------------+--------+ Arterial anastomosis                           occluded +--------------------+----------+-----------------+--------+ Prox graft                                     occluded +--------------------+----------+-----------------+--------+ Mid graft                                      occluded +--------------------+----------+-----------------+--------+ Distal graft  occluded +--------------------+----------+-----------------+--------+  Summary: RT AVG appears occluded.  *See table(s) above for measurements and observations.  Suggest Peripheral Vascular Consult. Diagnosing physician: Gerarda Fraction Electronically signed by Gerarda Fraction on 04/11/2023 at 3:42:01 PM.    --------------------------------------------------------------------------------   Final    DG  Chest 2 View  Result Date: 04/11/2023 CLINICAL DATA:  Chest pain. EXAM: CHEST - 2 VIEW COMPARISON:  April 01, 2023. FINDINGS: Stable cardiomegaly. Mild central pulmonary vascular congestion is noted. Stable bilateral pulmonary nodules are noted in the left perihilar and right basilar regions. Old distal left clavicular fracture is noted. IMPRESSION: Stable cardiomegaly with central pulmonary vascular congestion. Stable bilateral pulmonary nodules are noted. These are concerning for possible neoplasm, and CT scan of the chest is recommended for further evaluation. Electronically Signed   By: Lupita Raider M.D.   On: 04/11/2023 15:08    Procedures .Critical Care  Performed by: Smitty Knudsen, PA-C Authorized by: Smitty Knudsen, PA-C   Critical care provider statement:    Critical care time (minutes):  43   Critical care start time:  04/11/2023 10:00 PM   Critical care end time:  04/11/2023 10:43 PM   Critical care was necessary to treat or prevent imminent or life-threatening deterioration of the following conditions:  Renal failure and cardiac failure   Critical care was time spent personally by me on the following activities:  Discussions with consultants, ordering and review of laboratory studies, obtaining history from patient or surrogate and examination of patient   I assumed direction of critical care for this patient from another provider in my specialty: no     Care discussed with: admitting provider      Medications Ordered in ED Medications - No data to display  ED Course/ Medical Decision Making/ A&P                               Medical Decision Making Amount and/or Complexity of Data Reviewed Labs: ordered. Radiology: ordered.  Risk Decision regarding hospitalization.   This patient presents to the ED for concern of weakness.  Differential diagnosis includes hyperkalemia, ACS, syncope, pneumonia, sepsis   Lab Tests:  I Ordered, and personally interpreted  labs.  The pertinent results include: CBC with baseline anemia slightly improved compared to baseline, BMP with significant renal impairment with BUN elevated at 108, creatinine at 18.37, and GFR less than 3.  Troponin levels elevated at 301 likely due to poor renal function.   Imaging Studies ordered:  I ordered imaging studies including chest x-ray, vascular ultrasound of right AV fistula site I independently visualized and interpreted imaging which showed stable cardiomegaly with central pulmonary vascular congestion I agree with the radiologist interpretation   Problem List / ED Course:  Patient with past history significant for ESRD on HD, type 2 DM, HTN, CHF, AV fistula occlusion presents to the ED with concerns of weakness. Reportedly had some weakness while at adult center and was brought in by EMS for evaluation. Has reportedly missed 4 dialysis sessions due to transportation issues and not experiencing some chest pain over the last 2 weeks. Notes some abdominal swelling as well as bilateral lower extremity swelling. States his fistula is not working but has not elaborated on why that is. Had labs and imaging ordered from triage. Labs suprisingly normal for missing multiple dialysis session. Potassium stable. Renal function notable taking a hit but baseline. Troponin is elevated at 301 but improved  to 250 on repeat. Will discuss case with nephrology as patient likely requires emergent dialysis given numerous sessions missed and elevated troponin. Doubt ACS.  Spoke with Dr. Ronalee Belts, nephrology, who advised reaching out to IR for intervention for AV access given occlusion seen on ultrasound. Does not feel that patient requires emergent dialysis tonight at this time but will require dialysis tomorrow morning and will ultimately need admission. Spoke with Dr. Archer Asa, IR, who advised that emergent IR tunneled catheter placement or other intervention would only be needed if nephrology believes  he requires emergent dialysis. Otherwise, advised that patient can follow up on Monday for outpatient management of occluded RT AVG. Spoke with Dr. Sherral Hammers, vascular surgery, who advised that they would not be able to perform any procedure over this weekend on this patient given to limited OR time. Can get to him on Monday unless there is an emergent need for dialysis sooner. Will consult with Dr. Ronalee Belts and inform him. Spoke with Dr. Ronalee Belts again and informed him of discussions with IR and vascular. Advised need for admission regardless and likely need for IR to place tunneled catheter for dialysis tomorrow for patient. Will admit to hospitalist at this time. Spoke with Dr. Allena Katz, hospitalist, who will be admitting patient.    Social Determinants of Health:  ESRD on hemodialysis  Final Clinical Impression(s) / ED Diagnoses Final diagnoses:  ESRD (end stage renal disease) on dialysis [name])  Arteriovenous fistula occlusion, initial encounter [facility])    Rx / DC Orders ED Discharge Orders     None         Smitty Knudsen, PA-C 04/11/23 2245    Pricilla Loveless, MD 04/13/23 1523

## 2023-04-11 NOTE — ED Notes (Signed)
Pt is refusing to speak to answer triage question.

## 2023-04-12 DIAGNOSIS — I132 Hypertensive heart and chronic kidney disease with heart failure and with stage 5 chronic kidney disease, or end stage renal disease: Secondary | ICD-10-CM | POA: Diagnosis not present

## 2023-04-12 DIAGNOSIS — E8779 Other fluid overload: Secondary | ICD-10-CM | POA: Diagnosis not present

## 2023-04-12 DIAGNOSIS — Z515 Encounter for palliative care: Secondary | ICD-10-CM | POA: Diagnosis not present

## 2023-04-12 DIAGNOSIS — T82858A Stenosis of vascular prosthetic devices, implants and grafts, initial encounter: Secondary | ICD-10-CM | POA: Diagnosis not present

## 2023-04-12 DIAGNOSIS — E1169 Type 2 diabetes mellitus with other specified complication: Secondary | ICD-10-CM | POA: Diagnosis not present

## 2023-04-12 DIAGNOSIS — Z91158 Patient's noncompliance with renal dialysis for other reason: Secondary | ICD-10-CM | POA: Diagnosis not present

## 2023-04-12 DIAGNOSIS — T85868A Thrombosis due to other internal prosthetic devices, implants and grafts, initial encounter: Secondary | ICD-10-CM | POA: Diagnosis not present

## 2023-04-12 DIAGNOSIS — Z5982 Transportation insecurity: Secondary | ICD-10-CM | POA: Diagnosis not present

## 2023-04-12 DIAGNOSIS — I152 Hypertension secondary to endocrine disorders: Secondary | ICD-10-CM | POA: Diagnosis not present

## 2023-04-12 DIAGNOSIS — E785 Hyperlipidemia, unspecified: Secondary | ICD-10-CM | POA: Diagnosis not present

## 2023-04-12 DIAGNOSIS — E1122 Type 2 diabetes mellitus with diabetic chronic kidney disease: Secondary | ICD-10-CM | POA: Diagnosis not present

## 2023-04-12 DIAGNOSIS — M898X9 Other specified disorders of bone, unspecified site: Secondary | ICD-10-CM | POA: Diagnosis not present

## 2023-04-12 DIAGNOSIS — Z811 Family history of alcohol abuse and dependence: Secondary | ICD-10-CM | POA: Diagnosis not present

## 2023-04-12 DIAGNOSIS — N186 End stage renal disease: Secondary | ICD-10-CM | POA: Diagnosis not present

## 2023-04-12 DIAGNOSIS — T82818A Embolism of vascular prosthetic devices, implants and grafts, initial encounter: Secondary | ICD-10-CM | POA: Diagnosis not present

## 2023-04-12 DIAGNOSIS — M79603 Pain in arm, unspecified: Secondary | ICD-10-CM | POA: Diagnosis not present

## 2023-04-12 DIAGNOSIS — E875 Hyperkalemia: Secondary | ICD-10-CM | POA: Diagnosis not present

## 2023-04-12 DIAGNOSIS — D631 Anemia in chronic kidney disease: Secondary | ICD-10-CM | POA: Diagnosis not present

## 2023-04-12 DIAGNOSIS — Z7982 Long term (current) use of aspirin: Secondary | ICD-10-CM | POA: Diagnosis not present

## 2023-04-12 DIAGNOSIS — Z5321 Procedure and treatment not carried out due to patient leaving prior to being seen by health care provider: Secondary | ICD-10-CM | POA: Diagnosis not present

## 2023-04-12 DIAGNOSIS — Z7189 Other specified counseling: Secondary | ICD-10-CM | POA: Diagnosis not present

## 2023-04-12 DIAGNOSIS — Z992 Dependence on renal dialysis: Secondary | ICD-10-CM | POA: Diagnosis not present

## 2023-04-12 DIAGNOSIS — R188 Other ascites: Secondary | ICD-10-CM | POA: Diagnosis not present

## 2023-04-12 DIAGNOSIS — Z5900 Homelessness unspecified: Secondary | ICD-10-CM | POA: Diagnosis not present

## 2023-04-12 DIAGNOSIS — R45851 Suicidal ideations: Secondary | ICD-10-CM | POA: Diagnosis not present

## 2023-04-12 DIAGNOSIS — Z794 Long term (current) use of insulin: Secondary | ICD-10-CM | POA: Diagnosis not present

## 2023-04-12 DIAGNOSIS — Z8249 Family history of ischemic heart disease and other diseases of the circulatory system: Secondary | ICD-10-CM | POA: Diagnosis not present

## 2023-04-12 DIAGNOSIS — I5022 Chronic systolic (congestive) heart failure: Secondary | ICD-10-CM | POA: Diagnosis not present

## 2023-04-12 DIAGNOSIS — Z8052 Family history of malignant neoplasm of bladder: Secondary | ICD-10-CM | POA: Diagnosis not present

## 2023-04-12 DIAGNOSIS — M96841 Postprocedural hematoma of a musculoskeletal structure following other procedure: Secondary | ICD-10-CM | POA: Diagnosis not present

## 2023-04-12 DIAGNOSIS — Y832 Surgical operation with anastomosis, bypass or graft as the cause of abnormal reaction of the patient, or of later complication, without mention of misadventure at the time of the procedure: Secondary | ICD-10-CM | POA: Diagnosis not present

## 2023-04-12 MED ORDER — CARVEDILOL 12.5 MG PO TABS
12.5000 mg | ORAL_TABLET | Freq: Two times a day (BID) | ORAL | Status: DC
  Administered 2023-04-14 (×2): 12.5 mg via ORAL
  Filled 2023-04-12 (×2): qty 1

## 2023-04-12 MED ORDER — HYDRALAZINE HCL 20 MG/ML IJ SOLN: 10.0000 mg | INTRAMUSCULAR | Status: DC | PRN

## 2023-04-12 MED ORDER — ISOSORBIDE MONONITRATE ER 60 MG PO TB24
60.0000 mg | ORAL_TABLET | Freq: Every day | ORAL | Status: DC
  Administered 2023-04-14: 60 mg via ORAL
  Filled 2023-04-12: qty 1

## 2023-04-12 MED ORDER — MELATONIN 3 MG PO TABS: 3.0000 mg | ORAL_TABLET | Freq: Every evening | ORAL | Status: DC | PRN

## 2023-04-12 MED ORDER — HYDRALAZINE HCL 25 MG PO TABS
25.0000 mg | ORAL_TABLET | Freq: Three times a day (TID) | ORAL | Status: DC
  Administered 2023-04-14 (×2): 25 mg via ORAL
  Filled 2023-04-12 (×4): qty 1

## 2023-04-12 MED ORDER — CALCIUM ACETATE (PHOS BINDER) 667 MG PO CAPS
2001.0000 mg | ORAL_CAPSULE | Freq: Three times a day (TID) | ORAL | Status: DC
  Administered 2023-04-14 (×2): 2001 mg via ORAL
  Filled 2023-04-12 (×2): qty 3

## 2023-04-12 MED ORDER — ONDANSETRON HCL 4 MG/2ML IJ SOLN: 4.0000 mg | Freq: Four times a day (QID) | INTRAMUSCULAR | Status: DC | PRN

## 2023-04-12 MED ORDER — SENNOSIDES-DOCUSATE SODIUM 8.6-50 MG PO TABS: 1.0000 | ORAL_TABLET | Freq: Every evening | ORAL | Status: DC | PRN

## 2023-04-12 MED ORDER — PANTOPRAZOLE SODIUM 40 MG PO TBEC
40.0000 mg | DELAYED_RELEASE_TABLET | Freq: Every day | ORAL | Status: DC
  Administered 2023-04-14: 40 mg via ORAL
  Filled 2023-04-12: qty 1

## 2023-04-12 MED ORDER — ACETAMINOPHEN 325 MG PO TABS
650.0000 mg | ORAL_TABLET | Freq: Four times a day (QID) | ORAL | Status: DC | PRN
  Administered 2023-04-16: 650 mg via ORAL
  Filled 2023-04-12 (×2): qty 2

## 2023-04-12 MED ORDER — INSULIN ASPART 100 UNIT/ML IJ SOLN
0.0000 [IU] | Freq: Three times a day (TID) | INTRAMUSCULAR | Status: DC
  Administered 2023-04-14: 1 [IU] via SUBCUTANEOUS

## 2023-04-12 MED ORDER — ACETAMINOPHEN 650 MG RE SUPP: 650.0000 mg | Freq: Four times a day (QID) | RECTAL | Status: DC | PRN

## 2023-04-12 MED ORDER — ONDANSETRON HCL 4 MG PO TABS: 4.0000 mg | ORAL_TABLET | Freq: Four times a day (QID) | ORAL | Status: DC | PRN

## 2023-04-12 NOTE — Progress Notes (Addendum)
Pt refuses to answer any assessment questions. Refused SCD's. MD notified.

## 2023-04-12 NOTE — Plan of Care (Signed)
Care plan  

## 2023-04-12 NOTE — Progress Notes (Signed)
PROGRESS NOTE    [name]  MWN:[id number] DOB: 1970-07-10 DOA: 04/11/2023 PCP: [name]   Brief Narrative:  [name] is a 52 y.o. male with medical history significant for ESRD on TTS HD, chronic HFrEF (EF 20-25%), insulin-dependent T2DM, HTN, LBBB, anemia of chronic disease, medical non-adherence who presents to the ED with fatigue and malaise after missing 4 appointments with dialysis per report.  Assessment & Plan:   Principal Problem:   ESRD on hemodialysis (HCC) Active Problems:   Clotted renal dialysis AV graft, initial encounter (HCC)   Chronic heart failure with reduced ejection fraction (HFrEF, <= 40%) (HCC)   Insulin dependent type 2 diabetes mellitus (HCC)   Hypertension associated with diabetes (HCC)   Anemia of chronic disease   Pulmonary nodules   ESRD on TTS HD RUE AVG occlusion: Noncompliance with treatment complicating patient's clinical course having missed 4 HD treatment sessions Nephrology, IR, vascular surgery consulted and following Defer to fistula evaluation/repair vs temp cath to team as above   Chronic HFrEF: Continue Coreg.  Volume managed with HD.   Insulin-dependent type 2 diabetes: Placed on SSI.   Hypertension: Continue Coreg, hydralazine, Imdur.   Anemia of chronic disease: Hemoglobin stable at 10.9.   Pulmonary nodules: Stable bilateral pulmonary nodules noted on CXR, follow up CT with PCP  DVT prophylaxis: SCDs Start: 04/12/23 0027 Code Status:   Code Status: Full Code Family Communication: None present  Status is: Inpt  Dispo: The patient is from: Home              Anticipated d/c is to: Home              Anticipated d/c date is: 72+h              Patient currently NOT medically stable for discharge  Consultants:  Nephro, IR, Vascular Sx  Procedures:  Pending above  Antimicrobials:  None   Subjective: No acute issues/events overnight  Objective: Vitals:   04/11/23 1314 04/11/23 1800  BP: (!)  178/104 (!) 177/109  Pulse: 79 80  Resp: 16 20  Temp:  97.6 F (36.4 C)  TempSrc:  Oral  SpO2: 98% 100%   No intake or output data in the 24 hours ending 04/12/23 0759 There were no vitals filed for this visit.  Examination:  General exam: Appears calm and comfortable  Respiratory system: Respiratory effort normal. Gastrointestinal system: Abdomen is nondistended Central nervous system: Alert and oriented. No focal neurological deficits. Extremities: Symmetric 5 x 5 power.  Data Reviewed: I have personally reviewed following labs and imaging studies  CBC: Recent Labs  Lab 04/11/23 1329  WBC 6.9  HGB 10.9*  HCT 35.3*  MCV 82.9  PLT 454*   Basic Metabolic Panel: Recent Labs  Lab 04/11/23 1329  NA 138  K 4.3  CL 97*  CO2 18*  GLUCOSE 113*  BUN 108*  CREATININE 18.37*  CALCIUM 7.8*   GFR: Estimated Creatinine Clearance: 4.9 mL/min (A) (by C-G formula based on SCr of 18.37 mg/dL (H)). Liver Function Tests: No results for input(s): "AST", "ALT", "ALKPHOS", "BILITOT", "PROT", "ALBUMIN" in the last 168 hours. No results for input(s): "LIPASE", "AMYLASE" in the last 168 hours. No results for input(s): "AMMONIA" in the last 168 hours. Coagulation Profile: Recent Labs  Lab 04/11/23 1329  INR 1.3*   Cardiac Enzymes: No results for input(s): "CKTOTAL", "CKMB", "CKMBINDEX", "TROPONINI" in the last 168 hours. BNP (last 3 results) No results for input(s): "PROBNP" in  the last 8760 hours. HbA1C: No results for input(s): "HGBA1C" in the last 72 hours. CBG: Recent Labs  Lab 04/11/23 1314  GLUCAP 136*   Lipid Profile: No results for input(s): "CHOL", "HDL", "LDLCALC", "TRIG", "CHOLHDL", "LDLDIRECT" in the last 72 hours. Thyroid Function Tests: No results for input(s): "TSH", "T4TOTAL", "FREET4", "T3FREE", "THYROIDAB" in the last 72 hours. Anemia Panel: No results for input(s): "VITAMINB12", "FOLATE", "FERRITIN", "TIBC", "IRON", "RETICCTPCT" in the last 72  hours. Sepsis Labs: No results for input(s): "PROCALCITON", "LATICACIDVEN" in the last 168 hours.  No results found for this or any previous visit (from the past 240 hour(s)).       Radiology Studies: VAS US DUPLEX DIALYSIS ACCESS (AVF, AVG)  Result Date: 04/11/2023 DIALYSIS ACCESS Patient Name:  [name]  Date of Exam:   04/11/2023 Medical Rec #: [id number]        Accession #:    [id number] Date of Birth: [birth year]       Patient Gender: M Patient Age:   12 years Exam Location:  [facility] Procedure:      VAS US DUPLEX DIALYSIS ACCESS (AVF, AVG) Referring Phys: Molly Maduro PATERSON --------------------------------------------------------------------------------  Reason for Exam: No palpable thrill for AVF/AVG. Access Site: Right Upper Extremity. Access Type: Forearm loop AVG. History: 02/20/2021 RT AVG creation. Comparison Study: Previous exam on 10/24/2022 showed patent graft Performing Technologist: Ernestene Mention RVT, RDMS  Examination Guidelines: A complete evaluation includes B-mode imaging, spectral Doppler, color Doppler, and power Doppler as needed of all accessible portions of each vessel. Unilateral testing is considered an integral part of a complete examination. Limited examinations for reoccurring indications may be performed as noted.  Findings:   +--------------------+----------+-----------------+--------+ AVG                 PSV (cm/s)Flow Vol (mL/min)Describe +--------------------+----------+-----------------+--------+ Native artery inflow    75                              +--------------------+----------+-----------------+--------+ Arterial anastomosis                           occluded +--------------------+----------+-----------------+--------+ Prox graft                                     occluded +--------------------+----------+-----------------+--------+ Mid graft                                      occluded  +--------------------+----------+-----------------+--------+ Distal graft                                   occluded +--------------------+----------+-----------------+--------+  Summary: RT AVG appears occluded.  *See table(s) above for measurements and observations.  Suggest Peripheral Vascular Consult. Diagnosing physician: Gerarda Fraction Electronically signed by Gerarda Fraction on 04/11/2023 at 3:42:01 PM.    --------------------------------------------------------------------------------   Final    DG Chest 2 View  Result Date: 04/11/2023 CLINICAL DATA:  Chest pain. EXAM: CHEST - 2 VIEW COMPARISON:  April 01, 2023. FINDINGS: Stable cardiomegaly. Mild central pulmonary vascular congestion is noted. Stable bilateral pulmonary nodules are noted in the left perihilar and right basilar regions. Old distal left clavicular fracture is noted. IMPRESSION: Stable cardiomegaly  with central pulmonary vascular congestion. Stable bilateral pulmonary nodules are noted. These are concerning for possible neoplasm, and CT scan of the chest is recommended for further evaluation. Electronically Signed   By: Lupita Raider M.D.   On: 04/11/2023 15:08        Scheduled Meds:  carvedilol  12.5 mg Oral BID WC   hydrALAZINE  25 mg Oral TID   insulin aspart  0-9 Units Subcutaneous TID WC   isosorbide mononitrate  60 mg Oral Daily   pantoprazole  40 mg Oral Daily   Continuous Infusions:   LOS: 0 days   Time spent:  Azucena Fallen, DO [facility]  If 7PM-7AM, please contact night-coverage [email or web address]  04/12/2023, 7:59 AM

## 2023-04-12 NOTE — Progress Notes (Signed)
Pt is refusing telemetry. MD aware

## 2023-04-12 NOTE — Consult Note (Signed)
Renal Service Consult Note [facility]  [name] 04/12/2023 Douglas Krabbe, MD Requesting Physician: Dr. Natale Anderson  Reason for Consult: ESRD pt w/ clotted AVG HPI: The patient is a 52 y.o. year-old w/ PMH as below who presented to ED c/o no dialysis for 1.5 weeks, and his fistula is not working. He is TTS at [facility]. Last inpatient HD here was on 11/12. Pt was admitted. We are asked to see pt for esrd.   Pt seen in room. No c/o's at this time. Has chronic LE edema / pretib edema. No sig SOB. On room air. Last HD here was 11/12.  Duplex US of his AV graft showed that the AVG "appears occluded". Pt is alert and denies any seizures or confusion. No sig SOB.   ROS - denies CP, no joint pain, no HA, no blurry vision, no rash, no diarrhea, no nausea/ vomiting   Past Medical History  Past Medical History:  Diagnosis Date   Anxiety 02/2019   Anxiety 10/2019   Diabetes mellitus type II, uncontrolled 07/17/2006   Elevated alkaline phosphatase level 01/2019   ESRD on hemodialysis (HCC)    tts HD   ESSENTIAL HYPERTENSION 07/17/2006   Homelessness 10/13/2021   HYPERLIPIDEMIA 11/24/2008   Insomnia 02/2019   Left bundle branch block 02/22/2019   Lesion of lip 10/2019   Pneumonia 10/22/2011   Suicidal ideations 02/2019   Vitamin D deficiency 01/2019   Past Surgical History  Past Surgical History:  Procedure Laterality Date   ARM SURGERY     AV FISTULA PLACEMENT Right 02/20/2021   Procedure: RIGHT ARTERIOVENOUS GRAFT CREATION;  Surgeon: Douglas Hint, MD;  Location: [facility] OR;  Service: Vascular;  Laterality: Right;   INSERTION OF DIALYSIS CATHETER Left 02/20/2021   Procedure: INSERTION OF DIALYSIS CATHETER USING PALINDROME CATHETER;  Surgeon: Douglas Hint, MD;  Location: [facility] OR;  Service: Vascular;  Laterality: Left;   IR AV DIALY SHUNT INTRO NEEDLE/INTRACATH INITIAL W/PTA/IMG RIGHT Right 11/06/2021   IR FLUORO GUIDE CV LINE RIGHT  09/12/2022   IR REMOVAL  TUN CV CATH W/O FL  07/27/2021   IR THROMBECTOMY AV FISTULA W/THROMBOLYSIS INC/SHUNT/IMG RIGHT  09/11/2022   IR US GUIDE VASC ACCESS RIGHT  11/06/2021   IR US GUIDE VASC ACCESS RIGHT  09/11/2022   IR US GUIDE VASC ACCESS RIGHT  09/12/2022   TEE WITHOUT CARDIOVERSION N/A 10/25/2022   Procedure: TRANSESOPHAGEAL ECHOCARDIOGRAM;  Surgeon: Chilton Si, MD;  Location: [facility] INVASIVE CV LAB;  Service: Cardiovascular;  Laterality: N/A;   ULTRASOUND GUIDANCE FOR VASCULAR ACCESS  02/20/2021   Procedure: ULTRASOUND GUIDANCE FOR VASCULAR ACCESS;  Surgeon: Douglas Hint, MD;  Location: [facility] OR;  Service: Vascular;;   Family History  Family History  Problem Relation Age of Onset   Hypertension Mother 48   Cancer Mother        bladder cancer   Alcohol abuse Father    Hypertension Brother    Social History  reports that he has never smoked. He has never used smokeless tobacco. He reports that he does not drink alcohol and does not use drugs. Allergies  Allergies  Allergen Reactions   Sulfa Antibiotics Rash    Severe rash.  Severe rash.     Severe rash.   Other Other (See Comments)    Patient states he is allergic to the TB test - unknown reaction during childhood    Bactrim [Sulfamethoxazole-Trimethoprim] Rash   Cephalexin Other (See Comments) and Rash  Patient with severe drug reaction including exfoliating skin rash and hypotension after being prescribed both cephalexin and sulfamethoxazole-trimethoprim simultaneously. Favor SMX as much more likely culprit as patient had tolerated other cephalosporins in the past, but cannot say with complete certainty that this was not related to cephalexin.  Other Reaction(s): Other, Other (See Comments)  Patient with severe drug reaction including exfoliating skin rash and hypotension after being prescribed both cephalexin and sulfamethoxazole-trimethoprim simultaneously. Favor SMX as much more likely culprit as patient had tolerated other  cephalosporins in the past, but cannot say with complete certainty that this was not related to cephalexin.   "Patient with severe drug reaction including exfoliating skin rash and hypotension after being prescribed both cephalexin and sulfamethoxazole-trimethoprim simultaneously. Favor SMX as much more likely culprit as patient had tolerated other cephalosporins in the past, but cannot say with complete certainty that this was not related to cephalexin." As noted in [facility]   "Patient with severe drug reaction including exfoliating skin rash and hypotension after being prescribed both cephalexin and sulfamethoxazole-trimethoprim simultaneously. Favor SMX as much more likely culprit as patient had tolerated other cephalosporins in the past, but cannot say with complete certainty that this was not related to cephalexin." As noted in [facility]   "Patient with severe drug reaction including exfoliating skin rash and hypotension after being prescribed both cephalexin and sulfamethoxazole-trimethoprim simultaneously. Favor SMX as much more likely culprit as patient had tolerated other cephalosporins in the past, but cannot say with complete certainty that this was not related to cephalexin." As noted in [facility]     Patient with severe drug reaction including exfoliating skin rash and hypotension after being prescribed both cephalexin and sulfamethoxazole-trimethoprim simultaneously. Favor SMX as much more likely culprit as patient had tolerated other cephalosporins in the past, but cannot say with complete certainty that this was not related to cephalexin.  "Patient with severe drug reaction including exfoliating skin rash and hypotension after being prescribed both cephalexin and sulfamethoxazole-trimethoprim simultaneously. Favor SMX as much more likely culprit as patient had tolerated other cephalosporins in the past, but cannot say with complete certainty that this was not related to cephalexin." As  noted in [facility]  "Patient with severe drug reaction including exfoliating skin rash and hypotension after being prescribed both cephalexin and sulfamethoxazole-trimethoprim simultaneously. Favor SMX as much more likely culprit as patient had tolerated other cephalosporins in the past, but cannot say with complete certainty that this was not related to cephalexin." As noted in [facility]     Patient with severe drug reaction inc... (TRUNCATED)   Home medications Prior to Admission medications   Medication Sig Start Date End Date Taking? Authorizing Provider  acetaminophen (TYLENOL) 500 MG tablet Take 1 tablet (500 mg total) by mouth every 8 (eight) hours as needed for mild pain. 05/28/22   Leroy Sea, MD  amoxicillin (AMOXIL) 500 MG capsule Take 1 capsule (500 mg total) by mouth 2 (two) times daily. 04/01/23   Small, Brooke L, PA  ASPIRIN LOW DOSE 81 MG tablet Take 1 tablet (81 mg total) by mouth daily. 05/28/22   Leroy Sea, MD  azithromycin (ZITHROMAX) 250 MG tablet Take first 2 tablets by mouth together, then take 1 tablet every day until finished. 04/01/23   Small, Brooke L, PA  calcitRIOL (ROCALTROL) 0.25 MCG capsule Take 1 capsule (0.25 mcg total) by mouth every Tuesday, Thursday, and Saturday at 6 PM. 10/26/22   Ghimire, Werner Lean, MD  calcium acetate (PHOSLO)  667 MG tablet Take 3 tablets (2,001 mg total) by mouth 3 (three) times daily. 03/06/23     carvedilol (COREG) 12.5 MG tablet Take 1 tablet by mouth twice a day with meals 03/06/23     doxycycline (VIBRA-TABS) 100 MG tablet Take 1 tablet (100 mg total) by mouth 2 (two) times daily. 03/21/23   Dione Booze, MD  glucose blood test strip Use to check fasting blood sugar once daily. 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 10/26/22   [provider]  hydrALAZINE (APRESOLINE) 25 MG tablet Take 1 tablet (25 mg total) by mouth 3 (three)  times daily. 02/15/23   Zannie Cove, MD  insulin aspart (NOVOLOG FLEXPEN) 100 UNIT/ML FlexPen 0-9 Units, Subcutaneous, 3 times daily with meals CBG < 70: Implement Hypoglycemia measures CBG 70 - 120: 0 units CBG 121 - 150: 1 unit CBG 151 - 200: 2 units CBG 201 - 250: 3 units CBG 251 - 300: 5 units CBG 301 - 350: 7 units CBG 351 - 400: 9 units CBG > 400: call MD 10/26/22   Maretta Bees, MD  insulin glargine-yfgn (SEMGLEE) 100 UNIT/ML injection Inject 0.07 mLs (7 Units total) into the skin at bedtime. 10/26/22   Ghimire, Werner Lean, MD  Insulin Pen Needle 32G X 4 MM MISC Use with insulin pens 05/28/22   Leroy Sea, MD  isosorbide mononitrate (IMDUR) 60 MG 24 hr tablet Take 1 tablet (60 mg total) by mouth daily. 10/26/22   Ghimire, Werner Lean, MD  melatonin 3 MG TABS tablet Take 1 tablet (3 mg total) by mouth at bedtime as needed (insomnia). 10/26/22   Ghimire, Werner Lean, MD  mupirocin ointment (BACTROBAN) 2 % Apply 1 Application topically 3 (three) times daily. 01/07/23   Mardella Layman, MD  ondansetron (ZOFRAN-ODT) 4 MG disintegrating tablet Take 1 tablet (4 mg total) by mouth every 4 (four) hours as needed for nausea/vomiting. 03/05/23   Melene Plan, DO  PROTONIX 40 MG tablet Take 1 tablet (40 mg total) by mouth daily. 05/25/22   Leroy Sea, MD  simethicone (MYLICON) 80 MG chewable tablet Chew 1 tablet (80 mg total) by mouth 4 (four) times daily for 5 days. 11/22/22 04/11/23  Rodolph Bong, MD  gabapentin (NEURONTIN) 300 MG capsule Take 1 capsule (300 mg total) by mouth 3 (three) times daily. Patient not taking: Reported on 07/27/2019 07/29/18 08/30/19  Kallie Locks, FNP  sildenafil (VIAGRA) 25 MG tablet Take 1 tablet (25 mg total) by mouth as needed for erectile dysfunction. for erectile dysfunction. Patient not taking: Reported on 08/11/2020 10/13/19 11/27/20  Kallie Locks, FNP     Vitals:   04/11/23 1314 04/11/23 1800  BP: (!) 178/104 (!) 177/109  Pulse: 79 80  Resp: 16 20  Temp:   97.6 F (36.4 C)  TempSrc:  Oral  SpO2: 98% 100%   Exam Gen alert, no distress No rash, cyanosis or gangrene Sclera anicteric, throat clear  No jvd or bruits Chest clear bilat to bases, no rales/ wheezing RRR no RG Abd soft ntnd no mass or ascites +bs GU nl male MS no joint effusions or deformity Ext 1-2+ bilat pretib edema, not as bad as usual Neuro is alert, Ox 3 , nf, no asterixis    RUA AVG no bruit     Renal-related home meds: - phoslo 3 ac - coreg 12.5 bid - imdur 60  every day - hydralazine 25 tid    OP HD:TTS [facility]  4h  400/800  2/2.5 bath  RUA AVG   Heparin 2000 - get updated records   Assessment/ Plan: Clotted R AVG - declotting not available on the weekends here. At the moment pt does not have an urgent indication for dialysis. Will consult IR for declot Monday. If needs urgent HD over the weekend will consult CCM for temp cath.  ESKD - on HD TTS. Plan HD Monday after access is re-established.  HTN - BP's a bit high, cont home meds.  Volume - moderate LE edema which is typical Anemia of eskd - Hb 10-11, no esa needs.  MBD ckd - CCa in range. Cont phoslo as binder w/ meals.     Vinson Moselle  MD [facility] 04/12/2023, 1:52 PM  Recent Labs  Lab 04/11/23 1329  HGB 10.9*  CALCIUM 7.8*  CREATININE 18.37*  K 4.3   Inpatient medications:  carvedilol  12.5 mg Oral BID WC   hydrALAZINE  25 mg Oral TID   insulin aspart  0-9 Units Subcutaneous TID WC   isosorbide mononitrate  60 mg Oral Daily   pantoprazole  40 mg Oral Daily    acetaminophen **OR** acetaminophen, hydrALAZINE, melatonin, ondansetron **OR** ondansetron (ZOFRAN) IV, senna-docusate

## 2023-04-12 NOTE — Plan of Care (Signed)

## 2023-04-13 DIAGNOSIS — N186 End stage renal disease: Secondary | ICD-10-CM | POA: Diagnosis not present

## 2023-04-13 DIAGNOSIS — Z992 Dependence on renal dialysis: Secondary | ICD-10-CM | POA: Diagnosis not present

## 2023-04-13 LAB — GLUCOSE, CAPILLARY: Glucose-Capillary: 118 mg/dL — ABNORMAL HIGH (ref 70–99)

## 2023-04-13 LAB — BASIC METABOLIC PANEL WITH GFR
Anion gap: 24 — ABNORMAL HIGH (ref 5–15)
BUN: 117 mg/dL — ABNORMAL HIGH (ref 6–20)
CO2: 15 mmol/L — ABNORMAL LOW (ref 22–32)
Calcium: 7.1 mg/dL — ABNORMAL LOW (ref 8.9–10.3)
Chloride: 97 mmol/L — ABNORMAL LOW (ref 98–111)
Creatinine, Ser: 18.99 mg/dL — ABNORMAL HIGH (ref 0.61–1.24)
GFR, Estimated: 3 mL/min — ABNORMAL LOW
Glucose, Bld: 179 mg/dL — ABNORMAL HIGH (ref 70–99)
Potassium: 4.9 mmol/L (ref 3.5–5.1)
Sodium: 136 mmol/L (ref 135–145)

## 2023-04-13 MED ORDER — CHLORHEXIDINE GLUCONATE CLOTH 2 % EX PADS
6.0000 | MEDICATED_PAD | Freq: Every day | CUTANEOUS | Status: DC
  Administered 2023-04-14: 6 via TOPICAL

## 2023-04-13 NOTE — Progress Notes (Signed)
[facility] Progress Note  Subjective: seen in room. Didn't sleep well, asked it we could draw the labs later in the day so he could get some rest.  They did labs at 2 pm, mostly just need reassurance that the K+ is not out of control, which it was not at 4.9.    Vitals:   04/11/23 1314 04/11/23 1800  Pulse: 79 80  Resp: 16 20  Temp:  97.6 F (36.4 C)  TempSrc:  Oral  SpO2: 98% 100%    Exam: Gen alert, no distress Sclera anicteric, throat clear  No jvd or bruits Chest clear bilat to bases RRR no RG Abd soft ntnd no mass or ascites +bs Ext 1-2+ bilat pretib edema, not as bad as usual Neuro is alert, Ox 3 , nf, no asterixis    RUE AVG no bruit       Renal-related home meds: - phoslo 3 ac - coreg 12.5 bid - imdur 60 every day - hydralazine 25 tid      OP HD:TTS [facility]  4h  400/800  2/2.5 bath  RUA AVG   Heparin 2000 - get updated records     Assessment/ Plan: Clotted R AVG - IR consulted for declot Monday, they have seen him today in preparation. No urgent HD need. K+ 4.9.  Access per IR.  ESKD - on HD TTS. Plan HD Monday after access is re-established. HD orders in.  HTN - BP's a bit high, cont home meds.  Volume - moderate LE edema which is typical, no resp issues which is typical Anemia of eskd - Hb 10-11, no esa needs.  MBD ckd - CCa in range. Cont phoslo as binder w/ meals.      Vinson Moselle MD  [facility] 04/13/2023, 7:22 PM  Recent Labs  Lab 04/11/23 1329 04/13/23 1404  HGB 10.9*  --   CALCIUM 7.8* 7.1*  CREATININE 18.37* 18.99*  K 4.3 4.9   No results for input(s): "IRON", "TIBC", "FERRITIN" in the last 168 hours. Inpatient medications:  calcium acetate  2,001 mg Oral TID WC   carvedilol  12.5 mg Oral BID WC   hydrALAZINE  25 mg Oral TID   insulin aspart  0-9 Units Subcutaneous TID WC   isosorbide mononitrate  60 mg Oral Daily   pantoprazole  40 mg Oral Daily    acetaminophen **OR** acetaminophen, hydrALAZINE, melatonin, ondansetron  **OR** ondansetron (ZOFRAN) IV, senna-docusate

## 2023-04-13 NOTE — Progress Notes (Signed)
PROGRESS NOTE    ANJAN ETCHEVERRY  QMV:[id number] DOB: April 19, 1971 DOA: 04/11/2023 PCP: [name]   Brief Narrative:  [name] is a 52 y.o. male with medical history significant for ESRD on TTS HD, chronic HFrEF (EF 20-25%), insulin-dependent T2DM, HTN, LBBB, anemia of chronic disease, medical non-adherence who presents to the ED with fatigue and malaise after missing 4 appointments with dialysis per report.  Assessment & Plan:   Principal Problem:   ESRD on hemodialysis (HCC) Active Problems:   Clotted renal dialysis AV graft, initial encounter (HCC)   Chronic heart failure with reduced ejection fraction (HFrEF, <= 40%) (HCC)   Insulin dependent type 2 diabetes mellitus (HCC)   Hypertension associated with diabetes (HCC)   Anemia of chronic disease   Pulmonary nodules   Noncompliance of patient with renal dialysis (HCC)   ESRD on TTS HD RUE AVG occlusion: Noncompliance with treatment complicating patient's clinical course having missed 4(or possibly more) HD treatment sessions Nephrology, IR, vascular surgery consulted and following Defer to fistula evaluation/repair vs temp cath to team as above -tentative plan for procedure 04/14/2023   Chronic HFrEF: Continue Coreg.  Volume managed with HD.   Insulin-dependent type 2 diabetes: Placed on SSI.   Hypertension: Continue Coreg, hydralazine, Imdur.   Anemia of chronic disease: Hemoglobin stable at 10.9.   Pulmonary nodules: Stable bilateral pulmonary nodules noted on CXR, follow up CT with PCP  DVT prophylaxis: SCDs Start: 04/12/23 0027 Code Status:   Code Status: Full Code Family Communication: None present  Status is: Inpt  Dispo: The patient is from: Home              Anticipated d/c is to: Home              Anticipated d/c date is: 48-72h              Patient currently NOT medically stable for discharge  Consultants:  Nephro, IR, Vascular Sx  Procedures:  Pending above  Antimicrobials:   None   Subjective: No acute issues/events overnight  Objective: Vitals:   04/11/23 1314 04/11/23 1800  BP: (!) 178/104 (!) 177/109  Pulse: 79 80  Resp: 16 20  Temp:  97.6 F (36.4 C)  TempSrc:  Oral  SpO2: 98% 100%    Intake/Output Summary (Last 24 hours) at 04/13/2023 2841 Last data filed at 04/12/2023 0900 Gross per 24 hour  Intake 0 ml  Output --  Net 0 ml   There were no vitals filed for this visit.  Examination:  General exam: Resting comfortably in bed Respiratory system: Respiratory effort normal. Gastrointestinal system: Abdomen is nondistended Central nervous system: Alert and oriented. No focal neurological deficits. Extremities: Moving all 4 limbs without overt deficits  Data Reviewed: I have personally reviewed following labs and imaging studies  CBC: Recent Labs  Lab 04/11/23 1329  WBC 6.9  HGB 10.9*  HCT 35.3*  MCV 82.9  PLT 454*   Basic Metabolic Panel: Recent Labs  Lab 04/11/23 1329  NA 138  K 4.3  CL 97*  CO2 18*  GLUCOSE 113*  BUN 108*  CREATININE 18.37*  CALCIUM 7.8*   GFR: CrCl cannot be calculated (Unknown ideal weight.).  Coagulation Profile: Recent Labs  Lab 04/11/23 1329  INR 1.3*   CBG: Recent Labs  Lab 04/11/23 1314  GLUCAP 136*   No results found for this or any previous visit (from the past 240 hour(s)).   Radiology Studies: VAS US DUPLEX DIALYSIS ACCESS (  AVF, AVG)  Result Date: 04/11/2023 DIALYSIS ACCESS Patient Name:  [name]  Date of Exam:   04/11/2023 Medical Rec #: [id number]        Accession #:    [id number] Date of Birth: [birth year]       Patient Gender: M Patient Age:   89 years Exam Location:  [facility] Procedure:      VAS US DUPLEX DIALYSIS ACCESS (AVF, AVG) Referring Phys: Molly Maduro PATERSON --------------------------------------------------------------------------------  Reason for Exam: No palpable thrill for AVF/AVG. Access Site: Right Upper Extremity. Access Type: Forearm loop  AVG. History: 02/20/2021 RT AVG creation. Comparison Study: Previous exam on 10/24/2022 showed patent graft Performing Technologist: Ernestene Mention RVT, RDMS  Examination Guidelines: A complete evaluation includes B-mode imaging, spectral Doppler, color Doppler, and power Doppler as needed of all accessible portions of each vessel. Unilateral testing is considered an integral part of a complete examination. Limited examinations for reoccurring indications may be performed as noted.  Findings:   +--------------------+----------+-----------------+--------+ AVG                 PSV (cm/s)Flow Vol (mL/min)Describe +--------------------+----------+-----------------+--------+ Native artery inflow    75                              +--------------------+----------+-----------------+--------+ Arterial anastomosis                           occluded +--------------------+----------+-----------------+--------+ Prox graft                                     occluded +--------------------+----------+-----------------+--------+ Mid graft                                      occluded +--------------------+----------+-----------------+--------+ Distal graft                                   occluded +--------------------+----------+-----------------+--------+  Summary: RT AVG appears occluded.  *See table(s) above for measurements and observations.  Suggest Peripheral Vascular Consult. Diagnosing physician: Gerarda Fraction Electronically signed by Gerarda Fraction on 04/11/2023 at 3:42:01 PM.    --------------------------------------------------------------------------------   Final    DG Chest 2 View  Result Date: 04/11/2023 CLINICAL DATA:  Chest pain. EXAM: CHEST - 2 VIEW COMPARISON:  April 01, 2023. FINDINGS: Stable cardiomegaly. Mild central pulmonary vascular congestion is noted. Stable bilateral pulmonary nodules are noted in the left perihilar and right basilar regions. Old distal left clavicular  fracture is noted. IMPRESSION: Stable cardiomegaly with central pulmonary vascular congestion. Stable bilateral pulmonary nodules are noted. These are concerning for possible neoplasm, and CT scan of the chest is recommended for further evaluation. Electronically Signed   By: Lupita Raider M.D.   On: 04/11/2023 15:08        Scheduled Meds:  calcium acetate  2,001 mg Oral TID WC   carvedilol  12.5 mg Oral BID WC   hydrALAZINE  25 mg Oral TID   insulin aspart  0-9 Units Subcutaneous TID WC   isosorbide mononitrate  60 mg Oral Daily   pantoprazole  40 mg Oral Daily   Continuous Infusions:   LOS: 1 day   Time spent:  Azucena Fallen, DO [facility]  If 7PM-7AM, please contact night-coverage [email or web address]  04/13/2023, 8:07 AM

## 2023-04-13 NOTE — Plan of Care (Signed)
IR was requested for AVF fistulogram and possible interventions.   Case was reviewed and approved by Dr. Elby Showers, plan for Monday afternoon.   Patient seen in room. States that it is not a good time to discuss about the procedure.  Informed that IR is planning for fistulogram and possible interventions and TDC placement, he asks to go ahead and place the [facility]. Informed the patient that if IR can fix his fistula, he will not need TDC which increases chances of infection. He states that his fistula never worked and he is seeking for different options. He asks about PD. Informed the patient that I am not sure if he will be a candidate for PD, but will ask nephrology to discuss with the patient regarding dialysis access options.   Patient was informed that IR will plan to proceed with fistulogram with possible intervention tomorrow afternoon, but IR team will follow up tomorrow after his discussion with nephrology.  He was informed that he can have early breakfast but will be nothing per mouth after that for possible procedure. Patient verbalized understanding.   Nephrology notified that patient wishes to discuss dialysis options.   PLAN - NPO except meds at 8 am tomorrow - RN notified. - IR team will follow up with 34M team/nephrology tomorrow and will get consent if patient is in agreement to proceed with fistulogram with possible interventions.    The procedure is tentatively scheduled for tomorrow PM pending IR schedule.  Please call IR for questions and concerns.   Lynann Bologna Bryleigh Ottaway PA-C 04/13/2023 11:35 AM

## 2023-04-13 NOTE — Progress Notes (Signed)
Patient refused at care, vitals, PM med, blood sugar check, MD notified. Patient educated. We continue to monitor.

## 2023-04-13 NOTE — Plan of Care (Signed)
Problem: Education: Goal: Ability to describe self-care measures that may prevent or decrease complications (Diabetes Survival Skills Education) will improve Outcome: Not Progressing Goal: Individualized Educational Video(s) Outcome: Not Progressing   Problem: Coping: Goal: Ability to adjust to condition or change in health will improve Outcome: Not Progressing   Problem: Fluid Volume: Goal: Ability to maintain a balanced intake and output will improve Outcome: Not Progressing   Problem: Health Behavior/Discharge Planning: Goal: Ability to identify and utilize available resources and services will improve Outcome: Not Progressing Goal: Ability to manage health-related needs will improve Outcome: Not Progressing   Problem: Metabolic: Goal: Ability to maintain appropriate glucose levels will improve Outcome: Not Progressing   Problem: Nutritional: Goal: Maintenance of adequate nutrition will improve Outcome: Not Progressing Goal: Progress toward achieving an optimal weight will improve Outcome: Not Progressing   Problem: Skin Integrity: Goal: Risk for impaired skin integrity will decrease Outcome: Not Progressing   Problem: Tissue Perfusion: Goal: Adequacy of tissue perfusion will improve Outcome: Not Progressing

## 2023-04-14 DIAGNOSIS — Z515 Encounter for palliative care: Secondary | ICD-10-CM | POA: Diagnosis not present

## 2023-04-14 DIAGNOSIS — Z7189 Other specified counseling: Secondary | ICD-10-CM | POA: Diagnosis not present

## 2023-04-14 DIAGNOSIS — Z992 Dependence on renal dialysis: Secondary | ICD-10-CM | POA: Diagnosis not present

## 2023-04-14 DIAGNOSIS — N186 End stage renal disease: Secondary | ICD-10-CM | POA: Diagnosis not present

## 2023-04-14 LAB — BASIC METABOLIC PANEL
Anion gap: 20 — ABNORMAL HIGH (ref 5–15)
BUN: 120 mg/dL — ABNORMAL HIGH (ref 6–20)
CO2: 18 mmol/L — ABNORMAL LOW (ref 22–32)
Calcium: 6.9 mg/dL — ABNORMAL LOW (ref 8.9–10.3)
Chloride: 96 mmol/L — ABNORMAL LOW (ref 98–111)
Creatinine, Ser: 19.44 mg/dL — ABNORMAL HIGH (ref 0.61–1.24)
GFR, Estimated: 3 mL/min — ABNORMAL LOW (ref >60)
Glucose, Bld: 160 mg/dL — ABNORMAL HIGH (ref 70–99)
Potassium: 4.5 mmol/L (ref 3.5–5.1)
Sodium: 134 mmol/L — ABNORMAL LOW (ref 135–145)

## 2023-04-14 LAB — GLUCOSE, CAPILLARY
Glucose-Capillary: 141 mg/dL — ABNORMAL HIGH (ref 70–99)
Glucose-Capillary: 78 mg/dL (ref 70–99)

## 2023-04-14 LAB — HEPATITIS B SURFACE ANTIGEN: Hepatitis B Surface Ag: NONREACTIVE

## 2023-04-14 NOTE — Progress Notes (Signed)
Pt refused Hydralazine medication despite education on importance. MD notified.

## 2023-04-14 NOTE — Progress Notes (Signed)
   IR Note  Tried to talk with pt and consent him for dialysis fistula declot evaluation and intervention. He will not acknowledge me.   Says he is asleep--- but is not. Asked him to just let me get a signature for the procedure and he refuses. I made him aware if we do not have a consent--- we cannot move forward. He said "fine"   Per MD note in chart and per RN Pt does consent for self

## 2023-04-14 NOTE — Progress Notes (Signed)
Patient refuses all care. Multiple attempts were made to get his vitals or take his PM med but he refused. His son tried talking to him to allow [place] get his vitals but he refused and asked his son not to ever visit him again. He also refused for lab to draw his blood. We continue to monitor.

## 2023-04-14 NOTE — Consult Note (Cosign Needed Addendum)
Palliative Medicine Inpatient Consult Note  Consulting Provider:  Azucena Fallen, MD   Reason for consult:   Palliative Care Consult Services Palliative Medicine Consult  Reason for Consult? Patient refusing care (ESRD/HD) - will need to discuss hospice at home w/ him and son   04/14/2023  HPI:  Per intake H&P --> [name] is a 52 y.o. male with medical history significant for ESRD on TTS HD, chronic HFrEF (EF 20-25%), insulin-dependent T2DM, HTN, LBBB, anemia of chronic disease. Palliative care has been asked to get involved to further assist with goals of care conversations.   Patient was followed by OP Palliative services through [facility] in 2022 though is appears he rarely interacted with the providers.  Clinical Assessment/Goals of Care:  *Please note that this is a verbal dictation therefore any spelling or grammatical errors are due to the "Dragon Medical One" system interpretation.  I have reviewed medical records including EPIC notes, labs and imaging, received report from bedside RN, assessed the patient.    I met with [name] and his son, [name] to further discuss diagnosis prognosis, GOC, EOL wishes, disposition and options.   I introduced Palliative Medicine as specialized medical care for people living with serious illness. It focuses on providing relief from the symptoms and stress of a serious illness. The goal is to improve quality of life for both the patient and the family.  Medical History Review and Understanding:  A review of patients past medical history inclusive of T2DM, HTN, LBB, ESRD, and HFrEF was completed.  Per chart review patient has an extensive - long history of noncompliance.   Social History:  [name] He is a widow. He has a son and two brothers who are actively involved in his life. He is not presently employed  Functional and Nutritional State:  Preceding hospitalization patient was  able to mobilize and provide basic care activities in the realm of bADLs. He had a good appetite.   Advance Directives:  A detailed discussion was had today regarding advanced directives.  I spoke to [name] about the importance of advance care planning to verify his wishes moving into the future regarding who he would desire to make decisions for him and what his wishes would be if he declined further.   At this point patient shares that he is considering who he wants to be his decision maker though he states clearly that he does not want it to be his son. I shared with him that if he becomes mentally incapacitated legally at this point in time his son will be his decision maker.   Code Status:  Concepts specific to code status, artifical feeding and hydration, continued IV antibiotics and rehospitalization was had.  The difference between a aggressive medical intervention path  and a palliative comfort care path for this patient at this time was had.   Patient not willing to participate in code status discussions during my time with he and his son.   Discussion:  We reviewed the reason(s) for patients acute hospitalization inclusive of his non-adherence to his hemodialysis treatments. We discussed the possible outcomes if patient continues to decline treatment and care inclusive of him dying. I shared he can refuse care though this would be with the understanding that his time will be limited. I shared that if this is what he desires then hospice is the next best option for him.   Donell became agitated during my time with him and he  requested to speak to his nurse. I was able to request patients RN, Dwana Curd who came to bedside. He asked her when his procedure will take place. She kindly informed him that he had eaten today inhibiting his procedure from happening.   I shared with [name] that from what I am witnessing he does desire pursuing with fistulogram with possible interventions. He  states that he does want to do this and is not ready to die. He continues to get agitated during my time at his bedside and declines additional conversations. He states that he agrees to the procedure. He also shares that he agrees to dialysis at this point.   I provided patients son information for the PMT in the event that additional information should be needed in the future.  _____________________ Addendum:  I met with patients son, [name] this late afternoon on 31M. We talked about patients struggles throughout his life with loss. We discussed that his wife dies, his two sons were sent to foster care, and his daughter died at 6 years old.    [name] lives on his own and frequently sees his father. His other son is 8 and remains in foster cares 18 to 79 program.   I shared openly and honestly my concerns about [name] not receiving dialysis at on a regular schedule. I asked what [name] feels might be inhibiting this. We reviewed patients actions and I asked if there was a mental health history. He shares to his understanding his father does not have a psychiatric history. [name] states that there is an issue with transportation though also that his father is lackadaisical about his self care. [name] is aware that [name] has a high likelihood of a clinical decline should be not accept care.  We reviewed that [name] lives in homeless housing off [address].  This is a relatively recent development. Prior to this he would often use the hospital as a place to stay. [name] lost his housing when his mother passed away.   Patients son shares that he and his father have a fragmented relationship though he wants the best for him. [name] enjoys wrestling, eating, and spending time with his sons.   [name] agrees with his father getting his fistula repaired. He vocalizes the desire for his father to continue dialysis regularly though realizes it is up to his father.   Add time: 66  Decision  Maker: Patient wants to make decisions for himself at this time. He is considering who he would want to make decisions if he were unable to do so. He is aware that it would legally be his next of kin in the absence of advanced directives.  SUMMARY OF RECOMMENDATIONS   Full Code/ Full scope of care  Conversations held in the setting of noncompliance with dialysis and possible long term ramifications inclusive of death  Patient shares willingness to get the fistulogram and continue HD treatments  Appreciate dialysis MSW insights on patients case  PMT will continue to chart check and will not re-engage unless specifically asked to  Code Status/Advance Care Planning: FULL CODE  Palliative Prophylaxis:  Aspiration, Bowel Regimen, Delirium Protocol, Frequent Pain Assessment, Oral Care, Palliative Wound Care, and Turn Reposition  Additional Recommendations (Limitations, Scope, Preferences): Continue current care  Psycho-social/Spiritual:  Desire for further Chaplaincy support: Not presently. Additional Recommendations: Education on non-adherence outcomes.    Prognosis: Limited overall in the setting of patient co-morbid conditions, recurrent re-hospitalizations, and ER visits.   Discharge Planning: Unclear presently.  Vitals:   04/14/23 0445 04/14/23 1022  BP: (!) 176/95 (!) 172/90  Pulse: 78 74  Resp:  18  Temp: 98.2 F (36.8 C) 97.6 F (36.4 C)  SpO2: 96% 100%    Intake/Output Summary (Last 24 hours) at 04/14/2023 1158 Last data filed at 04/14/2023 0900 Gross per 24 hour  Intake 580 ml  Output 1 ml  Net 579 ml   Gen:  Frail middle aged chronically ill appearing AA M who is agitated HEENT: moist mucous membranes CV: Regular rate and rhythm  PULM:  On RA, breathing is even and nonlabored ABD: soft/nontender  EXT: BLE edema  Neuro: Alert and oriented x3   PPS: 40%   This conversation/these recommendations were discussed with patient primary care team, Dr.  Natale Milch  Billing based on MDM: High  Problems Addressed: One acute or chronic illness or injury that poses a threat to life or bodily function  Amount and/or Complexity of Data: Category 3:Discussion of management or test interpretation with external physician/other qualified health care professional/appropriate source (not separately reported)  Risks: Decision not to resuscitate or to de-escalate care because of poor prognosis ______________________________________________________ Lamarr Lulas [place] Palliative Medicine Team Team Cell Phone: [phone] Please utilize secure chat with additional questions, if there is no response within 30 minutes please call the above phone number  Palliative Medicine Team providers are available by phone from 7am to 7pm daily and can be reached through the team cell phone.  Should this patient require assistance outside of these hours, please call the patient's attending physician.

## 2023-04-14 NOTE — Progress Notes (Signed)
PROGRESS NOTE    Douglas LOWY  Anderson:[id number] DOB: 05-29-1970 DOA: 04/11/2023 PCP: [name]   Brief Narrative:  [name] is a 52 y.o. male with medical history significant for ESRD on TTS HD, chronic HFrEF (EF 20-25%), insulin-dependent T2DM, HTN, LBBB, anemia of chronic disease, medical non-adherence who presents to the ED with fatigue and malaise after missing 4 appointments with dialysis per report.  Assessment & Plan:   Principal Problem:   ESRD on hemodialysis (HCC) Active Problems:   Clotted renal dialysis AV graft, initial encounter (HCC)   Chronic heart failure with reduced ejection fraction (HFrEF, <= 40%) (HCC)   Insulin dependent type 2 diabetes mellitus (HCC)   Hypertension associated with diabetes (HCC)   Anemia of chronic disease   Pulmonary nodules   Noncompliance of patient with renal dialysis (HCC)   Profound noncompliance, goals of care  -Lengthy discussion this morning with patient, son at bedside.  Multiple team members attempted to discuss patient's case, there was an attempt to draw blood as well as indication for procedure today to repair his fistula so he can continue with dialysis which he desperately needs. -Patient refused to consent to procedure, refused procedure and refused dialysis -as such he is likely only going to decompensate and we discussed ultimately would die if he continues to be noncompliant with his care.  Given our conversation (essentially between the son and myself as the patient was not participating despite being awake and in the room) palliative care was consulted to further discuss patient's wishes -At this time we are unable to appropriately treat the patient due to his noncompliance and nonadherence to medical care.  He continues to be n.p.o. in hopes that he will agree for fistula repair so that we can urgently perform dialysis. **Palliative care consulted and discussed care with patient which he again refused to speak  with**  ESRD on TTS HD RUE AVG occlusion: Noncompliance with treatment complicating patient's clinical course having missed 4(or possibly more) HD treatment sessions -see above Nephrology, IR, vascular surgery consulted and following Defer to fistula evaluation/repair vs temp cath to team as above -tentative plan for procedure 04/14/2023   Chronic HFrEF: Continue Coreg.  Volume managed with HD.   Insulin-dependent type 2 diabetes: Placed on SSI.   Hypertension: Continue Coreg, hydralazine, Imdur.   Anemia of chronic disease: Hemoglobin stable per previous labs   Pulmonary nodules: Stable bilateral pulmonary nodules noted on CXR, follow up CT with PCP  DVT prophylaxis: SCDs Start: 04/12/23 0027 Code Status:   Code Status: Full Code Family Communication: None present  Status is: Inpt  Dispo: The patient is from: Home              Anticipated d/c is to: Home              Anticipated d/c date is: 48-72h              Patient currently NOT medically stable for discharge  Consultants:  Nephro, IR, Vascular Sx  Procedures:  Pending above  Antimicrobials:  None   Subjective: No acute issues/events overnight -patient continues to be noncompliant, refusing labs or consenting to procedure this morning, see above  Objective: Vitals:   04/11/23 1314 04/11/23 1800 04/14/23 0445  BP:   (!) 176/95  Pulse: 79 80 78  Resp: 16 20   Temp:  97.6 F (36.4 C) 98.2 F (36.8 C)  TempSrc:  Oral Oral  SpO2: 98% 100% 96%  Intake/Output Summary (Last 24 hours) at 04/14/2023 0753 Last data filed at 04/13/2023 1300 Gross per 24 hour  Intake 840 ml  Output 1 ml  Net 839 ml   There were no vitals filed for this visit.  Examination:  General exam: Resting comfortably in bed, essentially nonverbal Respiratory system: Respiratory effort normal. Gastrointestinal system: Abdomen is nondistended Central nervous system: Alert and oriented. No focal neurological  deficits. Extremities: Moving all 4 limbs without overt deficits -notable edema in lower extremities, difficult to evaluate as patient remains covered  Data Reviewed: I have personally reviewed following labs and imaging studies  CBC: Recent Labs  Lab 04/11/23 1329  WBC 6.9  HGB 10.9*  HCT 35.3*  MCV 82.9  PLT 454*   Basic Metabolic Panel: Recent Labs  Lab 04/11/23 1329 04/13/23 1404  NA 138 136  K 4.3 4.9  CL 97* 97*  CO2 18* 15*  GLUCOSE 113* 179*  BUN 108* 117*  CREATININE 18.37* 18.99*  CALCIUM 7.8* 7.1*   GFR: CrCl cannot be calculated (Unknown ideal weight.).  Coagulation Profile: Recent Labs  Lab 04/11/23 1329  INR 1.3*   CBG: Recent Labs  Lab 04/11/23 1314 04/13/23 1212  GLUCAP 136* 118*   No results found for this or any previous visit (from the past 240 hour(s)).   Radiology Studies: No results found.      Scheduled Meds:  calcium acetate  2,001 mg Oral TID WC   carvedilol  12.5 mg Oral BID WC   Chlorhexidine Gluconate Cloth  6 each Topical Q0600   hydrALAZINE  25 mg Oral TID   insulin aspart  0-9 Units Subcutaneous TID WC   isosorbide mononitrate  60 mg Oral Daily   pantoprazole  40 mg Oral Daily   Continuous Infusions:   LOS: 2 days   Time spent:  Azucena Fallen, DO [facility]  If 7PM-7AM, please contact night-coverage [email or web address]  04/14/2023, 7:53 AM

## 2023-04-14 NOTE — Progress Notes (Addendum)
[facility] Progress Note   Subjective: Refused to sign permit for IR earlier, would not talk to IR PA. Now eating sandwich. Son at bedside telling him that he isn't supposed to be eating.     Objective Vitals:   04/11/23 1314 04/11/23 1800 04/14/23 0445 04/14/23 1022  BP:   (!) 176/95 (!) 172/90  Pulse: 79 80 78 74  Resp: 16 20  18   Temp:  97.6 F (36.4 C) 98.2 F (36.8 C) 97.6 F (36.4 C)  TempSrc:  Oral Oral Oral  SpO2: 98% 100% 96% 100%   Physical Exam General: Chronically ill disheveled male inNAD Heart: RRR Lungs: CTAB Abdomen: protuberant, ? Ascites. NABS Extremities:2+ BLE edema Dialysis Access: R AVG No T/B   Additional Objective Labs: Basic Metabolic Panel: Recent Labs  Lab 04/11/23 1329 04/13/23 1404  NA 138 136  K 4.3 4.9  CL 97* 97*  CO2 18* 15*  GLUCOSE 113* 179*  BUN 108* 117*  CREATININE 18.37* 18.99*  CALCIUM 7.8* 7.1*   Liver Function Tests: No results for input(s): "AST", "ALT", "ALKPHOS", "BILITOT", "PROT", "ALBUMIN" in the last 168 hours. No results for input(s): "LIPASE", "AMYLASE" in the last 168 hours. CBC: Recent Labs  Lab 04/11/23 1329  WBC 6.9  HGB 10.9*  HCT 35.3*  MCV 82.9  PLT 454*   Blood Culture    Component Value Date/Time   SDES BLOOD LEFT HAND 03/15/2023 2355   SDES BLOOD LEFT FOREARM 03/15/2023 2355   SPECREQUEST  03/15/2023 2355    BOTTLES DRAWN AEROBIC AND ANAEROBIC Blood Culture adequate volume   SPECREQUEST  03/15/2023 2355    BOTTLES DRAWN AEROBIC AND ANAEROBIC Blood Culture adequate volume   CULT  03/15/2023 2355    NO GROWTH 5 DAYS Performed at [facility], [address], [place], [place] [address]    CULT  03/15/2023 2355    NO GROWTH 5 DAYS Performed at [facility], [address] [place], [place] [address]    REPTSTATUS 03/21/2023 FINAL 03/15/2023 2355   REPTSTATUS 03/21/2023 FINAL 03/15/2023 2355    Cardiac Enzymes: No results for input(s): "CKTOTAL", "CKMB",  "CKMBINDEX", "TROPONINI" in the last 168 hours. CBG: Recent Labs  Lab 04/11/23 1314 04/13/23 1212  GLUCAP 136* 118*   Iron Studies: No results for input(s): "IRON", "TIBC", "TRANSFERRIN", "FERRITIN" in the last 72 hours. @lablastinr3 @ Studies/Results: No results found. Medications:   calcium acetate  2,001 mg Oral TID WC   carvedilol  12.5 mg Oral BID WC   Chlorhexidine Gluconate Cloth  6 each Topical Q0600   hydrALAZINE  25 mg Oral TID   insulin aspart  0-9 Units Subcutaneous TID WC   isosorbide mononitrate  60 mg Oral Daily   pantoprazole  40 mg Oral Daily     OP HD:TTS [facility]  4h  400/800  2/2.5 bath  RUA AVG   Heparin 2000 - get updated records     Assessment/ Plan: Clotted R AVG - IR consulted for declot Monday, they have seen him today in preparation. No urgent HD need. K+ 4.9.  Access per IR however pt refusing to cooperate. Attending aware. Will consult Palliative Care.  ESKD - on HD TTS. Plan HD Monday after access is re-established. HD orders in-See above. Plan on hold for now. Marland Kitchen  HTN - BP's a bit high, cont home meds.  Volume - moderate LE edema which is typical, no resp issues which is typical Anemia of eskd - Hb 10-11, no  esa needs.  MBD ckd - CCa in range. Cont phoslo as binder w/ meals.  GOC-Son at bedside. Updated on events above. He says he will try to talk to his father.   Prabhav Faulkenberry H. Lindalou Soltis NP-C 04/14/2023, 10:52 AM  [employer] [phone]

## 2023-04-15 ENCOUNTER — Inpatient Hospital Stay (HOSPITAL_COMMUNITY): Payer: 59

## 2023-04-15 DIAGNOSIS — Z992 Dependence on renal dialysis: Secondary | ICD-10-CM | POA: Diagnosis not present

## 2023-04-15 DIAGNOSIS — N186 End stage renal disease: Secondary | ICD-10-CM | POA: Diagnosis not present

## 2023-04-15 DIAGNOSIS — F4325 Adjustment disorder with mixed disturbance of emotions and conduct: Secondary | ICD-10-CM

## 2023-04-15 DIAGNOSIS — Z91158 Patient's noncompliance with renal dialysis for other reason: Secondary | ICD-10-CM

## 2023-04-15 HISTORY — PX: IR US GUIDE VASC ACCESS RIGHT: IMG2390

## 2023-04-15 HISTORY — PX: IR THROMBECTOMY AV FISTULA W/THROMBOLYSIS/PTA/STENT INC/SHUNT/IMG RT: IMG6120

## 2023-04-15 LAB — GLUCOSE, CAPILLARY: Glucose-Capillary: 74 mg/dL (ref 70–99)

## 2023-04-15 LAB — HEPATITIS B SURFACE ANTIBODY, QUANTITATIVE: Hep B S AB Quant (Post): <3.5 m[IU]/mL — ABNORMAL LOW

## 2023-04-15 MED ORDER — FENTANYL CITRATE (PF) 100 MCG/2ML IJ SOLN
INTRAMUSCULAR | Status: AC
  Filled 2023-04-15: qty 2

## 2023-04-15 MED ORDER — NALOXONE HCL 0.4 MG/ML IJ SOLN: 0.4000 mg | INTRAMUSCULAR | Status: DC | PRN

## 2023-04-15 MED ORDER — LIDOCAINE-EPINEPHRINE 1 %-1:100000 IJ SOLN
20.0000 mL | Freq: Once | INTRAMUSCULAR | Status: DC
  Filled 2023-04-15: qty 20

## 2023-04-15 MED ORDER — FENTANYL CITRATE (PF) 100 MCG/2ML IJ SOLN
INTRAMUSCULAR | Status: AC | PRN
  Administered 2023-04-15 (×2): 25 ug via INTRAVENOUS
  Administered 2023-04-15: 50 ug via INTRAVENOUS

## 2023-04-15 MED ORDER — IOHEXOL 300 MG/ML  SOLN
100.0000 mL | Freq: Once | INTRAMUSCULAR | Status: AC | PRN
  Administered 2023-04-15: 30 mL

## 2023-04-15 MED ORDER — OXYCODONE HCL 5 MG PO TABS
5.0000 mg | ORAL_TABLET | Freq: Once | ORAL | Status: AC | PRN
  Administered 2023-04-15: 5 mg via ORAL
  Filled 2023-04-15: qty 1

## 2023-04-15 MED ORDER — ALTEPLASE 2 MG IJ SOLR
INTRAMUSCULAR | Status: AC
Start: 2023-04-15 — End: ?
  Filled 2023-04-15: qty 2

## 2023-04-15 MED ORDER — IOHEXOL 300 MG/ML  SOLN
100.0000 mL | Freq: Once | INTRAMUSCULAR | Status: AC | PRN
  Administered 2023-04-15: 30 mL via INTRAVENOUS

## 2023-04-15 MED ORDER — ALTEPLASE 2 MG IJ SOLR
INTRAMUSCULAR | Status: AC
  Filled 2023-04-15: qty 2

## 2023-04-15 MED ORDER — MIDAZOLAM HCL 2 MG/2ML IJ SOLN
INTRAMUSCULAR | Status: AC
Start: 2023-04-15 — End: ?
  Filled 2023-04-15: qty 2

## 2023-04-15 MED ORDER — HEPARIN SODIUM (PORCINE) 1000 UNIT/ML IJ SOLN
INTRAMUSCULAR | Status: AC | PRN
  Administered 2023-04-15: 5000 [IU] via INTRAVENOUS

## 2023-04-15 MED ORDER — LIDOCAINE-EPINEPHRINE 1 %-1:100000 IJ SOLN
INTRAMUSCULAR | Status: AC
Start: 2023-04-15 — End: ?
  Filled 2023-04-15: qty 1

## 2023-04-15 MED ORDER — HEPARIN SODIUM (PORCINE) 1000 UNIT/ML IJ SOLN
INTRAMUSCULAR | Status: AC
  Filled 2023-04-15: qty 10

## 2023-04-15 MED ORDER — MIDAZOLAM HCL 2 MG/2ML IJ SOLN
INTRAMUSCULAR | Status: AC
  Filled 2023-04-15: qty 2

## 2023-04-15 MED ORDER — MIDAZOLAM HCL 2 MG/2ML IJ SOLN
INTRAMUSCULAR | Status: AC | PRN
  Administered 2023-04-15 (×2): .5 mg via INTRAVENOUS
  Administered 2023-04-15: 1 mg via INTRAVENOUS

## 2023-04-15 NOTE — Progress Notes (Signed)
Pt receives out-pt HD at [facility] on TTS 6:40 am chair time. Will assist as needed.   Olivia Canter Renal Navigator [phone]

## 2023-04-15 NOTE — Progress Notes (Signed)
Patient refused to swab his nose for MRSA.

## 2023-04-15 NOTE — Discharge Instructions (Signed)
[facility] [address] [place], [place], [address] [phone] phone OUTPATIENT (located on the second floor) Walk-in information: Please note, all walk-ins are first come & first serve, with limited number of available spots. Therapist for therapy:  Monday (male) & Wednesdays-Thursdays (male): Please ARRIVE at 7:00 AM for registration Will START at 8:00 AM Every 1st & 2nd Friday of the month: Please ARRIVE at 7:00 AM for registration Will START at 1 PM - 5 PM (virtual) Psychiatrist for medication management: Monday - Friday:  Please ARRIVE at 7:00 AM for registration Will START at 8:00 AM   Regretfully, due to limited availability, please be aware that you may not been seen on the same day as walk-in. Please consider making an appointment or trying again. Thank you for your patience and understanding.

## 2023-04-15 NOTE — TOC CM/SW Note (Signed)
Transition of Care [facility]) - Inpatient Brief Assessment   Patient Details  Name: [name] MRN: [id number] Date of Birth: 09/03/1970  Transition of Care [facility]) CM/SW Contact:    [name], Douglas Coria, RN Phone Number: 04/15/2023, 4:30 PM   Clinical Narrative:  Patient presented to the ED with Malaise, Lethargy, Ascites and Peripheral Edema noted to Lower Extremities. Missed 4 HD appts d/t transportation, Patient has access to [employer]. Patient also states his Fistula is not working. Plan to declot Fistula, patient declined. Nephrology following. Palliative Care and Psych consulted.  Patient is currently homeless, states he will return to a shelter at discharge.   TOC will continue to follow as patient progresses towards discharge.               Transition of Care Asessment:

## 2023-04-15 NOTE — Procedures (Signed)
Interventional Radiology Procedure Note  Procedure:  1) Right brachiobasilic arteriovenous graft declot 2) Venous anastomotic covered stent placement  Findings: Please refer to procedural dictation for full description.   Clotted AVG to venous anastomosis where there was severe focal narrowing.  S/p balloon angioplasty demonstrated venous hemorrhage into biceps muscle.  Rupture excluded by 7 mm x 7.5 cm covered stent (Viatorr).  Completion fistulagram demonstrates wide patency of graft.  Complications: Right biceps muscle hematoma.  Estimated Blood Loss: 10 mL  Recommendations: Supportive care for right biceps hematoma, consider compression wrap and pain control. Monitor for compartment syndrome. AVG ready for use, as needed.    Marliss Coots, MD

## 2023-04-15 NOTE — Progress Notes (Signed)
Patient has refused to go to dialysis tonight, refusing vitals, CBG checks, and scheduled medications.  Notified by RN that he is complaining of severe pain at his right upper extremity AV graft site.  Patient is requesting pain medications but not allowing nursing staff to examine his arm.  Refusing Tylenol.  Chart reviewed, patient had undergone repair of right upper extremity clotted AV graft today by IR which was complicated by right biceps muscle hematoma.   I have seen the patient, resting comfortably and had his face covered with a blanket.  I have spoken to him and he continues to refuse care, refuses to go to dialysis.  Appeared agitated and not interested in speaking.  He did allow me to examine his right upper extremity.  Denies any numbness or tingling.  On exam, his right upper extremity is neurovascularly intact.  Radial pulse strong.  AV graft with strong palpable thrill and bruit heard on auscultation.  Arm and forearm muscle compartments soft to touch.  No signs of compartment syndrome at this time.  Continue to monitor closely.  Monitor H&H, avoid anticoagulation and antiplatelet agents.  Oxy IR ordered for pain.

## 2023-04-15 NOTE — Consult Note (Signed)
[facility] Consult Evaluation  Service Date: April 15, 2023 LOS:  LOS: 3 days    Primary Psychiatric Diagnoses  Adjustment d/o   Assessment  [name] is a 52 y.o. male admitted medically for 04/11/2023  1:10 PM for dialysis complications; has had dozens of similar hospitalizations this year. He carries the psychiatric diagnoses of no diagnoses and has a past medical history of  anxiety, diabetes, LBBB, lip lesions, insomnia. Psychiatry was consulted for Need to make sure patient is not severely depressed/similar interfering with care decision making/processing  by Dr. Natale Milch.  This patient declined consult, and refused to answer any questions, rebuffing all efforts with "I told you I was good". During brief interview he revealed very poor healthcare literacy (ie didn't understand why refusing blood draws while he was asleep impacted his care. There is good collateral from pt's son in palliative care note that pt has not suffered from significant psychiatric sx burden, although appears to have endorsed suicidal ideation once in 2020. Ultimately, there is very little that can be done in the short-term for a pt who refuses interview and all interventions - cannot offer therapy to a pt who refuses to interact; cannot offer meds to pt who refuses outside of life threatening situations. He does not appear to be an acute danger to himself (beyond intermittently refusing dialysis). If repeat psychiatric evaluation is desired, please reconsult if pt expresses either a danger to self or desire to speak to psychiatry. Should formal capacity evaluation be desired, please re-consult psychiatry AFTER  a) determining if treatment over objection will be offered (even on time limited basis) b) determining surrogate decision maker and we will arrange a time to see pt in tandem with primary team.  Diagnoses:  Active Hospital problems: Principal Problem:   ESRD on hemodialysis (HCC) Active  Problems:   Insulin dependent type 2 diabetes mellitus (HCC)   Hypertension associated with diabetes (HCC)   Chronic heart failure with reduced ejection fraction (HFrEF, <= 40%) (HCC)   Clotted renal dialysis AV graft, initial encounter (HCC)   Anemia of chronic disease   Pulmonary nodules   Noncompliance of patient with renal dialysis [facility])     Plan   ## Psychiatric Medication Recommendations:  -- None  ## Medical Decision Making Capacity:  Not formally assessed for any specific decision. Has generally low healthcare literacy   ## Further Work-up:  -- none recommended    -- most recent EKG on 11/22 had QtC of 565 in setting of BBB; not prescribing anything that would prolong qtc -- Pertinent labwork reviewed earlier this admission includes: multiple lab abnormalities c/w  CKD  ## Disposition:  -- There are no psychiatric contraindications to discharge at this time  ## Behavioral / Environmental:  --  Utilize compassion and acknowledge the patient's experiences while setting clear and realistic expectations for care.    ## Safety and Observation Level:  - Based on my clinical evaluation, I estimate the patient to be at low risk of self harm in the current setting - At this time, we recommend a routine level of observation. This decision is based on my review of the chart including patient's history and current presentation, interview of the patient, mental status examination, and consideration of suicide risk including evaluating suicidal ideation, plan, intent, suicidal or self-harm behaviors, risk factors, and protective factors. This judgment is based on our ability to directly address suicide risk, implement suicide prevention strategies and develop a safety plan while  the patient is in the clinical setting. Please contact our team if there is a concern that risk level has changed.  Suicide risk assessment  Patient has following modifiable risk factors for suicide: lack  of access to outpatient mental health resources, which we are addressing by putting resources in chart should he choose to engage.   Patient has following non-modifiable or demographic risk factors for suicide: male gender  Patient has the following protective factors against suicide: Supportive family and no known history of suicide attempts   Thank you for this consult request. Recommendations have been communicated to the primary team.  We will sign off at this time.   Quanda Pavlicek A Osias Resnick  Psychiatric and Social History   Relevant Aspects of Hospital Course:  Admitted on 04/11/2023 for complications of refusing dialysis. They have generally refused treatment inpt.   Patient Report:  Pt seen. States "I don't need a psychiatrist. I'm good". Responds to all attempts at f/u questions with a variant of "I'm good".   Psych ROS:  Responded to all questions with "I'm good"  Collateral information:  None today   Psychiatric History:  Information collected from pall care note  Prev Dx/Sx: no formal, 1 episode SI in 2020 Current Psych Provider: none Home Meds (current): none Previous Med Trials: unknown Therapy: none  Prior ECT: none Prior Psych Hospitalization: presumably none  Prior Self Harm: no attempts known Prior Violence: none  Family Psych History: unknown Family Hx suicide: unknown   Social History:  Living Situation: homeless  Substance History Tobacco use: no per EMR Alcohol use: no per EMR Drug use: no UDS available   Exam Findings   Psychiatric Specialty Exam:  Presentation  General Appearance: -- (Completely hidden by covers)  Eye Contact:None  Speech:Clear and Coherent  Speech Volume:Normal  Handedness:No data recorded  Mood and Affect  Mood:-- (did not state)  Affect:-- (could not judge affect through opaque blanket)   Thought Process  Thought Processes:Coherent; Goal Directed (goal is termination of interview)  Descriptions of  Associations:Intact  Orientation:-- (at least grossly oriented to self and situation)  Thought Content:-- (no delusions, paranoia evident in brief interview)  Hallucinations:Hallucinations: None  Ideas of Reference:None  Suicidal Thoughts:Suicidal Thoughts: No  Homicidal Thoughts:Homicidal Thoughts: No   Sensorium  Memory:-- (could not assess)  Judgment:Poor  Insight:Poor   Executive Functions  Concentration:Fair  Attention Span:Fair  Recall:Fair  Fund of Knowledge:Fair  Language:Fair   Psychomotor Activity  Psychomotor Activity:Psychomotor Activity: -- (under blanket)   Assets  Assets:Communication Skills   Sleep  Sleep:Sleep: -- (unclear)    Physical Exam: Vital signs:  Temp:  [98.3 F (36.8 C)-98.5 F (36.9 C)] 98.5 F (36.9 C) (11/26 0518) Pulse Rate:  [63-66] 63 (11/26 0518) Resp:  [18-20] 18 (11/26 0518) BP: (140-153)/(77-91) 144/86 (11/26 0518) SpO2:  [95 %-100 %] 95 % (11/26 0518) Weight:  [82.6 kg] 82.6 kg (11/26 0500) Physical Exam Vitals and nursing note reviewed.   Pt appears thin. Almost totally covered by blanket.   Blood pressure (!) 144/86, pulse 63, temperature 98.5 F (36.9 C), resp. rate 18, weight 82.6 kg, SpO2 95%. Body mass index is 28.52 kg/m.   Other History   These have been pulled in through the EMR, reviewed, and updated if appropriate.   Family History:  The patient's family history includes Alcohol abuse in his father; Cancer in his mother; Hypertension in his brother; Hypertension (age of onset: 60) in his mother.  Medical History: Past Medical History:  Diagnosis  Date   Anxiety 02/2019   Anxiety 10/2019   Diabetes mellitus type II, uncontrolled 07/17/2006   Elevated alkaline phosphatase level 01/2019   ESRD on hemodialysis (HCC)    tts HD   ESSENTIAL HYPERTENSION 07/17/2006   Homelessness 10/13/2021   HYPERLIPIDEMIA 11/24/2008   Insomnia 02/2019   Left bundle branch block 02/22/2019   Lesion of lip  10/2019   Pneumonia 10/22/2011   Suicidal ideations 02/2019   Vitamin D deficiency 01/2019    Surgical History: Past Surgical History:  Procedure Laterality Date   ARM SURGERY     AV FISTULA PLACEMENT Right 02/20/2021   Procedure: RIGHT ARTERIOVENOUS GRAFT CREATION;  Surgeon: Chuck Hint, MD;  Location: [facility] OR;  Service: Vascular;  Laterality: Right;   INSERTION OF DIALYSIS CATHETER Left 02/20/2021   Procedure: INSERTION OF DIALYSIS CATHETER USING PALINDROME CATHETER;  Surgeon: Chuck Hint, MD;  Location: [facility] OR;  Service: Vascular;  Laterality: Left;   IR AV DIALY SHUNT INTRO NEEDLE/INTRACATH INITIAL W/PTA/IMG RIGHT Right 11/06/2021   IR FLUORO GUIDE CV LINE RIGHT  09/12/2022   IR REMOVAL TUN CV CATH W/O FL  07/27/2021   IR THROMBECTOMY AV FISTULA W/THROMBOLYSIS INC/SHUNT/IMG RIGHT  09/11/2022   IR US GUIDE VASC ACCESS RIGHT  11/06/2021   IR US GUIDE VASC ACCESS RIGHT  09/11/2022   IR US GUIDE VASC ACCESS RIGHT  09/12/2022   TEE WITHOUT CARDIOVERSION N/A 10/25/2022   Procedure: TRANSESOPHAGEAL ECHOCARDIOGRAM;  Surgeon: Chilton Si, MD;  Location: [facility] INVASIVE CV LAB;  Service: Cardiovascular;  Laterality: N/A;   ULTRASOUND GUIDANCE FOR VASCULAR ACCESS  02/20/2021   Procedure: ULTRASOUND GUIDANCE FOR VASCULAR ACCESS;  Surgeon: Chuck Hint, MD;  Location: [facility] OR;  Service: Vascular;;    Medications:   Current Facility-Administered Medications:    acetaminophen (TYLENOL) tablet 650 mg, 650 mg, Oral, Q6H PRN **OR** acetaminophen (TYLENOL) suppository 650 mg, 650 mg, Rectal, Q6H PRN, Charlsie Quest, MD   calcium acetate (PHOSLO) capsule 2,001 mg, 2,001 mg, Oral, TID WC, Delano Metz, MD, 2,001 mg at 04/14/23 1834   carvedilol (COREG) tablet 12.5 mg, 12.5 mg, Oral, BID WC, Darreld Mclean R, MD, 12.5 mg at 04/14/23 1834   Chlorhexidine Gluconate Cloth 2 % PADS 6 each, 6 each, Topical, Q0600, Delano Metz, MD, 6 each at 04/14/23 1610   hydrALAZINE (APRESOLINE)  injection 10 mg, 10 mg, Intravenous, Q4H PRN, Charlsie Quest, MD   hydrALAZINE (APRESOLINE) tablet 25 mg, 25 mg, Oral, TID, Darreld Mclean R, MD, 25 mg at 04/14/23 1835   insulin aspart (novoLOG) injection 0-9 Units, 0-9 Units, Subcutaneous, TID WC, Charlsie Quest, MD, 1 Units at 04/14/23 1836   isosorbide mononitrate (IMDUR) 24 hr tablet 60 mg, 60 mg, Oral, Daily, Darreld Mclean R, MD, 60 mg at 04/14/23 1024   melatonin tablet 3 mg, 3 mg, Oral, QHS PRN, Allena Katz, Vishal R, MD   ondansetron (ZOFRAN) tablet 4 mg, 4 mg, Oral, Q6H PRN **OR** ondansetron (ZOFRAN) injection 4 mg, 4 mg, Intravenous, Q6H PRN, Allena Katz, Vishal R, MD   pantoprazole (PROTONIX) EC tablet 40 mg, 40 mg, Oral, Daily, Allena Katz, Vishal R, MD, 40 mg at 04/14/23 1024   senna-docusate (Senokot-S) tablet 1 tablet, 1 tablet, Oral, QHS PRN, Charlsie Quest, MD  Allergies: Allergies  Allergen Reactions   Sulfa Antibiotics Rash    Severe rash.  Severe rash.     Severe rash.   Other Other (See Comments)    Patient states he is allergic to the  TB test - unknown reaction during childhood    Bactrim [Sulfamethoxazole-Trimethoprim] Rash   Cephalexin Other (See Comments) and Rash    Patient with severe drug reaction including exfoliating skin rash and hypotension after being prescribed both cephalexin and sulfamethoxazole-trimethoprim simultaneously. Favor SMX as much more likely culprit as patient had tolerated other cephalosporins in the past, but cannot say with complete certainty that this was not related to cephalexin.  Other Reaction(s): Other, Other (See Comments)  Patient with severe drug reaction including exfoliating skin rash and hypotension after being prescribed both cephalexin and sulfamethoxazole-trimethoprim simultaneously. Favor SMX as much more likely culprit as patient had tolerated other cephalosporins in the past, but cannot say with complete certainty that this was not related to cephalexin.   "Patient with severe drug  reaction including exfoliating skin rash and hypotension after being prescribed both cephalexin and sulfamethoxazole-trimethoprim simultaneously. Favor SMX as much more likely culprit as patient had tolerated other cephalosporins in the past, but cannot say with complete certainty that this was not related to cephalexin." As noted in [facility]   "Patient with severe drug reaction including exfoliating skin rash and hypotension after being prescribed both cephalexin and sulfamethoxazole-trimethoprim simultaneously. Favor SMX as much more likely culprit as patient had tolerated other cephalosporins in the past, but cannot say with complete certainty that this was not related to cephalexin." As noted in [facility]   "Patient with severe drug reaction including exfoliating skin rash and hypotension after being prescribed both cephalexin and sulfamethoxazole-trimethoprim simultaneously. Favor SMX as much more likely culprit as patient had tolerated other cephalosporins in the past, but cannot say with complete certainty that this was not related to cephalexin." As noted in [facility]     Patient with severe drug reaction including exfoliating skin rash and hypotension after being prescribed both cephalexin and sulfamethoxazole-trimethoprim simultaneously. Favor SMX as much more likely culprit as patient had tolerated other cephalosporins in the past, but cannot say with complete certainty that this was not related to cephalexin.  "Patient with severe drug reaction including exfoliating skin rash and hypotension after being prescribed both cephalexin and sulfamethoxazole-trimethoprim simultaneously. Favor SMX as much more likely culprit as patient had tolerated other cephalosporins in the past, but cannot say with complete certainty that this was not related to cephalexin." As noted in [facility]  "Patient with severe drug reaction including exfoliating skin rash and hypotension after being prescribed both  cephalexin and sulfamethoxazole-trimethoprim simultaneously. Favor SMX as much more likely culprit as patient had tolerated other cephalosporins in the past, but cannot say with complete certainty that this was not related to cephalexin." As noted in [facility]     Patient with severe drug reaction inc... (TRUNCATED)

## 2023-04-15 NOTE — Progress Notes (Signed)
The patient had vomit all over the floor. I cleaned up the vomit and checked the patients vitals everything was stable.

## 2023-04-15 NOTE — Progress Notes (Signed)
Patient refused to go to hemodialysis tonight, says he wants to go tomorrow morning. He also refused vitals, cbg, and medication for tonight, MD notified.

## 2023-04-15 NOTE — Progress Notes (Signed)
PROGRESS NOTE    [name]  GNF:[id number] DOB: December 21, 1970 DOA: 04/11/2023 PCP: [name]   Brief Narrative:  [name] is a 52 y.o. male with medical history significant for ESRD on TTS HD, chronic HFrEF (EF 20-25%), insulin-dependent T2DM, HTN, LBBB, anemia of chronic disease, medical non-adherence who presents to the ED with fatigue and malaise after missing 4 appointments with dialysis per report.  Assessment & Plan:   Principal Problem:   ESRD on hemodialysis (HCC) Active Problems:   Clotted renal dialysis AV graft, initial encounter (HCC)   Chronic heart failure with reduced ejection fraction (HFrEF, <= 40%) (HCC)   Insulin dependent type 2 diabetes mellitus (HCC)   Hypertension associated with diabetes (HCC)   Anemia of chronic disease   Pulmonary nodules   Noncompliance of patient with renal dialysis (HCC)   Profound noncompliance, goals of care  -Lengthy discussion this morning with patient, son at bedside.  Multiple team members attempted to discuss patient's case, there was an attempt to draw blood as well as indication for procedure today to repair his fistula so he can continue with dialysis which he desperately needs. -Patient previously refused to discuss/consent for procedure 11/25 -patient allowed son to give verbal consent this morning over the phone for procedure and continue dialysis.  -Pending right AV declot will likely resume dialysis per nephrology, subsequent discharge likely in the next 24 to 48 hours if this can be completed. -Hospice consulted 11/25 given patient's refusal for care concerning for transitioning towards a palliative or hospice pathway, patient refused to discuss case with this team -Psych has been consulted 11/26 to evaluate patient's mental status/competency given his abnormal behavior, refusal of care despite apparent foresight and knowledge that he will continue to deteriorate and eventually die if he continues to decline  care and dialysis.  ESRD on TTS HD RUE AVG occlusion: Noncompliance with treatment complicating patient's clinical course having missed 4(or possibly more) HD treatment sessions -see above Nephrology, IR, vascular surgery consulted and following Patient refused fistula repair versus temp cath placement 04/14/2023 Patient allowed son to give consent over the phone this morning, patient remains n.p.o.   Chronic HFrEF: Continue Coreg.  Volume managed with HD.   Insulin-dependent type 2 diabetes: Placed on SSI.   Hypertension: Continue Coreg, hydralazine, Imdur.   Anemia of chronic disease: Hemoglobin stable per previous labs   Pulmonary nodules: Stable bilateral pulmonary nodules noted on CXR, follow up CT with PCP  DVT prophylaxis: SCDs Start: 04/12/23 0027 Code Status:   Code Status: Full Code Family Communication: None present  Status is: Inpt  Dispo: The patient is from: Home              Anticipated d/c is to: Home              Anticipated d/c date is: 48-72h              Patient currently NOT medically stable for discharge  Consultants:  Nephro, IR, Vascular Sx  Procedures:  Pending above  Antimicrobials:  None   Subjective: No acute issues/events overnight -patient continues to be essentially nonverbal today, refused to answer even rudimentary questions about his care or medical condition.  Objective: Vitals:   04/14/23 1822 04/14/23 2048 04/15/23 0500 04/15/23 0518  BP: (!) 153/91 (!) 140/77  (!) 144/86  Pulse: 66 63  63  Resp: 20 18  18   Temp: 98.3 F (36.8 C) 98.4 F (36.9 C)  98.5 F (36.9 C)  TempSrc: Oral     SpO2: 100% 99%  95%  Weight:   82.6 kg     Intake/Output Summary (Last 24 hours) at 04/15/2023 0757 Last data filed at 04/15/2023 0500 Gross per 24 hour  Intake 480 ml  Output 0 ml  Net 480 ml   Filed Weights   04/15/23 0500  Weight: 82.6 kg    Examination:  General exam: Resting comfortably in bed, essentially nonverbal  responding with occasional one-word answers Respiratory system: Respiratory effort normal. Gastrointestinal system: Abdomen is nondistended Central nervous system: Alert without obvious focal neurological deficits. Extremities: Moving all 4 limbs without overt deficits -notable edema in lower extremities, difficult to evaluate as patient remains covered  Data Reviewed: I have personally reviewed following labs and imaging studies  CBC: Recent Labs  Lab 04/11/23 1329  WBC 6.9  HGB 10.9*  HCT 35.3*  MCV 82.9  PLT 454*   Basic Metabolic Panel: Recent Labs  Lab 04/11/23 1329 04/13/23 1404 04/14/23 1004  NA 138 136 134*  K 4.3 4.9 4.5  CL 97* 97* 96*  CO2 18* 15* 18*  GLUCOSE 113* 179* 160*  BUN 108* 117* 120*  CREATININE 18.37* 18.99* 19.44*  CALCIUM 7.8* 7.1* 6.9*   GFR: Estimated Creatinine Clearance: 4.7 mL/min (A) (by C-G formula based on SCr of 19.44 mg/dL (H)).  Coagulation Profile: Recent Labs  Lab 04/11/23 1329  INR 1.3*   CBG: Recent Labs  Lab 04/11/23 1314 04/13/23 1212 04/14/23 1820 04/14/23 2042  GLUCAP 136* 118* 141* 78   No results found for this or any previous visit (from the past 240 hour(s)).   Radiology Studies: No results found.      Scheduled Meds:  calcium acetate  2,001 mg Oral TID WC   carvedilol  12.5 mg Oral BID WC   Chlorhexidine Gluconate Cloth  6 each Topical Q0600   hydrALAZINE  25 mg Oral TID   insulin aspart  0-9 Units Subcutaneous TID WC   isosorbide mononitrate  60 mg Oral Daily   pantoprazole  40 mg Oral Daily   Continuous Infusions:   LOS: 3 days   Time spent:  Azucena Fallen, DO [facility]  If 7PM-7AM, please contact night-coverage [email or web address]  04/15/2023, 7:57 AM

## 2023-04-15 NOTE — Plan of Care (Signed)
Care plan implemented

## 2023-04-15 NOTE — Progress Notes (Signed)
[facility] Progress Note   Subjective: Seen in room, so far hasn't eaten anything. Dried vomit reported on floor but denies nausea. HD after declot. Refusing labs and CBG.     Objective Vitals:   04/14/23 1822 04/14/23 2048 04/15/23 0500 04/15/23 0518  BP: (!) 153/91 (!) 140/77  (!) 144/86  Pulse: 66 63  63  Resp: 20 18  18   Temp: 98.3 F (36.8 C) 98.4 F (36.9 C)  98.5 F (36.9 C)  TempSrc: Oral     SpO2: 100% 99%  95%  Weight:   82.6 kg    Physical Exam General: Chronically ill disheveled male inNAD Heart: RRR Lungs: CTAB Abdomen: protuberant, ? Ascites. NABS Extremities:2+ BLE edema Dialysis Access: R AVG No T/B  Additional Objective Labs: Basic Metabolic Panel: Recent Labs  Lab 04/11/23 1329 04/13/23 1404 04/14/23 1004  NA 138 136 134*  K 4.3 4.9 4.5  CL 97* 97* 96*  CO2 18* 15* 18*  GLUCOSE 113* 179* 160*  BUN 108* 117* 120*  CREATININE 18.37* 18.99* 19.44*  CALCIUM 7.8* 7.1* 6.9*   Liver Function Tests: No results for input(s): "AST", "ALT", "ALKPHOS", "BILITOT", "PROT", "ALBUMIN" in the last 168 hours. No results for input(s): "LIPASE", "AMYLASE" in the last 168 hours. CBC: Recent Labs  Lab 04/11/23 1329  WBC 6.9  HGB 10.9*  HCT 35.3*  MCV 82.9  PLT 454*   Blood Culture    Component Value Date/Time   SDES BLOOD LEFT HAND 03/15/2023 2355   SDES BLOOD LEFT FOREARM 03/15/2023 2355   SPECREQUEST  03/15/2023 2355    BOTTLES DRAWN AEROBIC AND ANAEROBIC Blood Culture adequate volume   SPECREQUEST  03/15/2023 2355    BOTTLES DRAWN AEROBIC AND ANAEROBIC Blood Culture adequate volume   CULT  03/15/2023 2355    NO GROWTH 5 DAYS Performed at [facility], [address] [place], [place] [address]    CULT  03/15/2023 2355    NO GROWTH 5 DAYS Performed at [facility], [address], [place], [place] [address]    REPTSTATUS 03/21/2023 FINAL 03/15/2023 2355   REPTSTATUS 03/21/2023 FINAL 03/15/2023 2355    Cardiac  Enzymes: No results for input(s): "CKTOTAL", "CKMB", "CKMBINDEX", "TROPONINI" in the last 168 hours. CBG: Recent Labs  Lab 04/11/23 1314 04/13/23 1212 04/14/23 1820 04/14/23 2042 04/15/23 0930  GLUCAP 136* 118* 141* 78 74   Iron Studies: No results for input(s): "IRON", "TIBC", "TRANSFERRIN", "FERRITIN" in the last 72 hours. @lablastinr3 @ Studies/Results: No results found. Medications:   calcium acetate  2,001 mg Oral TID WC   carvedilol  12.5 mg Oral BID WC   Chlorhexidine Gluconate Cloth  6 each Topical Q0600   hydrALAZINE  25 mg Oral TID   insulin aspart  0-9 Units Subcutaneous TID WC   isosorbide mononitrate  60 mg Oral Daily   pantoprazole  40 mg Oral Daily     OP HD:TTS [facility]  4h  400/800  2/2.5 bath  RUA AVG   Heparin 2000 - get updated records     Assessment/ Plan: Clotted R AVG - IR consulted for declot Monday, they have seen him today in preparation. No urgent HD need. K+ 4.5 11/25. Refused labs today. Refused to cooperate with declot procedure 04/14/2023.  consulted Palliative Care and psych. Scheduled for declot today. Hopefully procedure hasn't been delayed too long and access can be salvaged.   ESKD - on HD TTS. Plan HD 04/15/2023 after access is re-established. HD orders  in-See above.Re HTN - BP's a bit high, cont home meds.  Volume - moderate LE edema which is typical, no resp issues which is typical Anemia of eskd - Hb 10-11, no esa needs. Refusing labs.  MBD ckd - CCa in range. Cont phoslo as binder w/ meals.  GOC-Son at bedside. Updated on events above. He says he will try to talk to his father.     Nissim Fleischer H. Nuri Larmer NP-C 04/15/2023, 10:28 AM  [employer] [phone]

## 2023-04-15 NOTE — Progress Notes (Signed)
Patient refused CBG and labs, MD aware.

## 2023-04-16 DIAGNOSIS — Z992 Dependence on renal dialysis: Secondary | ICD-10-CM | POA: Diagnosis not present

## 2023-04-16 DIAGNOSIS — N186 End stage renal disease: Secondary | ICD-10-CM | POA: Diagnosis not present

## 2023-04-16 LAB — BASIC METABOLIC PANEL
Anion gap: 21 — ABNORMAL HIGH (ref 5–15)
BUN: 132 mg/dL — ABNORMAL HIGH (ref 6–20)
CO2: 14 mmol/L — ABNORMAL LOW (ref 22–32)
Calcium: 7 mg/dL — ABNORMAL LOW (ref 8.9–10.3)
Chloride: 99 mmol/L (ref 98–111)
Creatinine, Ser: 20.69 mg/dL — ABNORMAL HIGH (ref 0.61–1.24)
GFR, Estimated: 2 mL/min — ABNORMAL LOW (ref >60)
Glucose, Bld: 107 mg/dL — ABNORMAL HIGH (ref 70–99)
Potassium: 5.6 mmol/L — ABNORMAL HIGH (ref 3.5–5.1)
Sodium: 134 mmol/L — ABNORMAL LOW (ref 135–145)

## 2023-04-16 LAB — CBC
HCT: 23.7 % — ABNORMAL LOW (ref 39.0–52.0)
Hemoglobin: 7.6 g/dL — ABNORMAL LOW (ref 13.0–17.0)
MCH: 25.5 pg — ABNORMAL LOW (ref 26.0–34.0)
MCHC: 32.1 g/dL (ref 30.0–36.0)
MCV: 79.5 fL — ABNORMAL LOW (ref 80.0–100.0)
Platelets: 324 10*3/uL (ref 150–400)
RBC: 2.98 MIL/uL — ABNORMAL LOW (ref 4.22–5.81)
RDW: 19 % — ABNORMAL HIGH (ref 11.5–15.5)
WBC: 7.2 10*3/uL (ref 4.0–10.5)
nRBC: 0 % (ref 0.0–0.2)

## 2023-04-16 MED ORDER — SODIUM ZIRCONIUM CYCLOSILICATE 10 G PO PACK
10.0000 g | PACK | Freq: Once | ORAL | Status: DC
  Filled 2023-04-16: qty 1

## 2023-04-16 MED ORDER — DARBEPOETIN ALFA 200 MCG/0.4ML IJ SOSY
200.0000 ug | PREFILLED_SYRINGE | Freq: Once | INTRAMUSCULAR | Status: DC
  Filled 2023-04-16: qty 0.4

## 2023-04-16 NOTE — Progress Notes (Signed)
Patient refused night time medication, vitals and cbg. He also refuses dialysis once again patient educated and MD notified.

## 2023-04-16 NOTE — Progress Notes (Signed)
0920:  Pt seen at the bedside with covering MD.  Pt c/o pain at right AVF site, medicated wity PRN tylenol.  Pt refused a physical assessment, refused all AM meds besides the tylenol.  Pt aware POC is for HD today.  0945: Report given to Amy, RN at HD

## 2023-04-16 NOTE — Progress Notes (Signed)
TRH night cross cover note:   I was notified by RN that the patient is refusing evening medications, evening vital sign checks, as well as this evening's dialysis session.    Newton Pigg, DO Hospitalist

## 2023-04-16 NOTE — Progress Notes (Signed)
[facility] Progress Note   Subjective: Seen in room. Dressing off AVG sutures visible but T/B. Refusing to come to HD. He has been educated that this is not OP clinic and  if he refuses HD now he will have to wait until later when spot in available in HD unit. He says he wants to eat lunch first.  K+5.6. Give Lokelma 10 grams PO now. HGB down, give ESA today. Discharge post HD today.   Objective Vitals:   04/15/23 1650 04/15/23 1655 04/15/23 1700 04/15/23 1710  BP: 133/81 137/86 137/76 124/64  Pulse: (!) 59 60 60 62  Resp: 12 15 18 12   Temp:      TempSrc:      SpO2: 100% 100% 100% 97%  Weight:       Physical Exam General: Chronically ill disheveled male with malodorous smell in NAD Heart: RRR Lungs: CTAB Abdomen: protuberant, ? Ascites. NABS Extremities:2+ BLE edema Dialysis Access: R AVG sutures intact +T/B  Additional Objective Labs: Basic Metabolic Panel: Recent Labs  Lab 04/13/23 1404 04/14/23 1004 04/16/23 0531  NA 136 134* 134*  K 4.9 4.5 5.6*  CL 97* 96* 99  CO2 15* 18* 14*  GLUCOSE 179* 160* 107*  BUN 117* 120* 132*  CREATININE 18.99* 19.44* 20.69*  CALCIUM 7.1* 6.9* 7.0*   Liver Function Tests: No results for input(s): "AST", "ALT", "ALKPHOS", "BILITOT", "PROT", "ALBUMIN" in the last 168 hours. No results for input(s): "LIPASE", "AMYLASE" in the last 168 hours. CBC: Recent Labs  Lab 04/11/23 1329 04/16/23 0531  WBC 6.9 7.2  HGB 10.9* 7.6*  HCT 35.3* 23.7*  MCV 82.9 79.5*  PLT 454* 324   Blood Culture    Component Value Date/Time   SDES BLOOD LEFT HAND 03/15/2023 2355   SDES BLOOD LEFT FOREARM 03/15/2023 2355   SPECREQUEST  03/15/2023 2355    BOTTLES DRAWN AEROBIC AND ANAEROBIC Blood Culture adequate volume   SPECREQUEST  03/15/2023 2355    BOTTLES DRAWN AEROBIC AND ANAEROBIC Blood Culture adequate volume   CULT  03/15/2023 2355    NO GROWTH 5 DAYS Performed at [facility], [address] [place], [place] [address]     CULT  03/15/2023 2355    NO GROWTH 5 DAYS Performed at [facility], [address] [place], [place] [address]    REPTSTATUS 03/21/2023 FINAL 03/15/2023 2355   REPTSTATUS 03/21/2023 FINAL 03/15/2023 2355    Cardiac Enzymes: No results for input(s): "CKTOTAL", "CKMB", "CKMBINDEX", "TROPONINI" in the last 168 hours. CBG: Recent Labs  Lab 04/11/23 1314 04/13/23 1212 04/14/23 1820 04/14/23 2042 04/15/23 0930  GLUCAP 136* 118* 141* 78 74   Iron Studies: No results for input(s): "IRON", "TIBC", "TRANSFERRIN", "FERRITIN" in the last 72 hours. @lablastinr3 @ Studies/Results: IR US Guide Vasc Access Right  Result Date: 04/15/2023 INDICATION: 52 year old male with history of end-stage renal disease and right upper extremity arteriovenous graft presenting with graft occlusion. EXAM: 1. Ultrasound-guided vascular access of the right upper extremity graft x2. 2. Right upper extremity arteriovenous graft fistulagram. 3. Pharmacomechanical thrombectomy of right upper extremity arteriovenous graft. 4. Covered self expanding stent placement. MEDICATIONS: 8 mg tPA, intravenous ANESTHESIA/SEDATION: Moderate (conscious) sedation was employed during this procedure. A total of Versed 2 mg and Fentanyl 100 mcg was administered intravenously. Moderate Sedation Time: 74 minutes. The patient's level of consciousness and vital signs were monitored continuously by radiology nursing throughout the procedure under my direct supervision. FLUOROSCOPY TIME:  Twenty-six mGy COMPLICATIONS: None immediate.  PROCEDURE: Informed written consent was obtained from the patient after a thorough discussion of the procedural risks, benefits and alternatives. All questions were addressed. Maximal Sterile Barrier Technique was utilized including caps, mask, sterile gowns, sterile gloves, sterile drape, hand hygiene and skin antiseptic. A timeout was performed prior to the initiation of the procedure. Preprocedure ultrasound  evaluation of the right upper extremity graft demonstrated brachial arterial anastomosis in the distal upper arm with occluded graft throughout originating at the anastomosis. The procedure was planned. First, to obtain antegrade access, a target several cm from the arterial anastomosis was targeted. Subdermal Local anesthesia was provided with 1% lidocaine. A small skin nick was made. In the antegrade direction, a 21 gauge micropuncture needle was introduced into the graft under direct ultrasound visualization. A permanent ultrasound image was captured and stored in the record. Micropuncture sheath introduced followed by placement of a Wholey wire which was directed to the central veins under fluoroscopic guidance. The sheath was exchanged for a 7 [place], short vascular sheath. Over the wire, an angled 5 French catheter was inserted and directed to the level of the subclavian vein. The wire was removed. Contrast was injected watts retracting the catheter which demonstrated patency of the right subclavian and axillary veins with luminal irregularity in thrombus originating in the mid upper arm at the site of venous graft anastomosis. Next, retrograde access was then obtained. Subdermal Local anesthesia was provided with 1% lidocaine at the planned needle entry site. A small skin nick was made. In the retrograde direction, a 21 gauge micropuncture needle was then introduced into the graft under direct ultrasound visualization. A permanent ultrasound image was captured and stored in the record. A micropuncture sheath was introduced followed by placement of a Wholey wire which was directed into the brachial artery. The micropuncture sheath was exchanged for a short 6 [place] vascular sheath. A Kumpe the catheter was placed over the wire which was removed and limited right upper extremity angiogram was performed which demonstrated occlusion of the indwelling graft and patency of the visualized brachial, radial, and ulnar  arteries. From the antegrade access, 8 mg tPA was administered while retracting the 5 French catheter through the occluded graft. Next, from the retrograde access, a Fogarty balloon was placed in 3 sequential arterial anastomotic balloon sweeps were performed. There was restoration of pulsatility within the graft at this point. Next, from the antegrade direction, a 7 [place] cleaner device was inserted and 2 passes were made throughout the length of the occluded graft. Initial fistulagram was then performed from the antegrade access which demonstrated brisk antegrade flow through the looped portion of the graft with sluggish outflow near the venous anastomosis where there is associated severe stenosis. Therefore, balloon maceration and angioplasty was performed near the venous outflow aspect of the graft in into the native basilic vein. Repeat fistulagram was performed which demonstrated wide patency of the graft with evidence of active extravasation near the venous anastomosis into the biceps musculature. Therefore, wire access was obtained to a stenotic collateral into the basilic vein and under fluoroscopic guidance, a 7 mm x 7.5 cm Viatorr stent was placed. Completion venogram demonstrated patency with brisk antegrade flow through the brachial basilic graft with resolution of previously visualized muscular extravasation. The central veins were widely patent. Z stitches were placed at each access site in the sheath were removed successfully achieving near immediate hemostasis. A sterile bandage was applied at each access site. The patient tolerated the procedure well was transferred back to  the floor in good condition. IMPRESSION: 1. Occluded brachial basilic arteriovenous loop graft in the right upper extremity. 2. Technically successful pharmacomechanical thrombectomy of right upper extremity arteriovenous graft. 3. Due to severe stenosis with vascular extravasation after angioplasty, the covered stent was  placed at the region of the venous anastomosis. ACCESS: This access remains amenable to future percutaneous interventions as clinically indicated. Marliss Coots, MD [facility] Radiology Electronically Signed   By: Marliss Coots M.D.   On: 04/15/2023 21:51   IR THROMBECTOMY AV FISTULA W/THROMBOLYSIS/PTA/STENT INC SHUNT/IMG RT  Result Date: 04/15/2023 INDICATION: 52 year old male with history of end-stage renal disease and right upper extremity arteriovenous graft presenting with graft occlusion. EXAM: 1. Ultrasound-guided vascular access of the right upper extremity graft x2. 2. Right upper extremity arteriovenous graft fistulagram. 3. Pharmacomechanical thrombectomy of right upper extremity arteriovenous graft. 4. Covered self expanding stent placement. MEDICATIONS: 8 mg tPA, intravenous ANESTHESIA/SEDATION: Moderate (conscious) sedation was employed during this procedure. A total of Versed 2 mg and Fentanyl 100 mcg was administered intravenously. Moderate Sedation Time: 74 minutes. The patient's level of consciousness and vital signs were monitored continuously by radiology nursing throughout the procedure under my direct supervision. FLUOROSCOPY TIME:  Twenty-six mGy COMPLICATIONS: None immediate. PROCEDURE: Informed written consent was obtained from the patient after a thorough discussion of the procedural risks, benefits and alternatives. All questions were addressed. Maximal Sterile Barrier Technique was utilized including caps, mask, sterile gowns, sterile gloves, sterile drape, hand hygiene and skin antiseptic. A timeout was performed prior to the initiation of the procedure. Preprocedure ultrasound evaluation of the right upper extremity graft demonstrated brachial arterial anastomosis in the distal upper arm with occluded graft throughout originating at the anastomosis. The procedure was planned. First, to obtain antegrade access, a target several cm  from the arterial anastomosis was targeted. Subdermal Local anesthesia was provided with 1% lidocaine. A small skin nick was made. In the antegrade direction, a 21 gauge micropuncture needle was introduced into the graft under direct ultrasound visualization. A permanent ultrasound image was captured and stored in the record. Micropuncture sheath introduced followed by placement of a Wholey wire which was directed to the central veins under fluoroscopic guidance. The sheath was exchanged for a 7 [place], short vascular sheath. Over the wire, an angled 5 French catheter was inserted and directed to the level of the subclavian vein. The wire was removed. Contrast was injected watts retracting the catheter which demonstrated patency of the right subclavian and axillary veins with luminal irregularity in thrombus originating in the mid upper arm at the site of venous graft anastomosis. Next, retrograde access was then obtained. Subdermal Local anesthesia was provided with 1% lidocaine at the planned needle entry site. A small skin nick was made. In the retrograde direction, a 21 gauge micropuncture needle was then introduced into the graft under direct ultrasound visualization. A permanent ultrasound image was captured and stored in the record. A micropuncture sheath was introduced followed by placement of a Wholey wire which was directed into the brachial artery. The micropuncture sheath was exchanged for a short 6 [place] vascular sheath. A Kumpe the catheter was placed over the wire which was removed and limited right upper extremity angiogram was performed which demonstrated occlusion of the indwelling graft and patency of the visualized brachial, radial, and ulnar arteries. From the antegrade access, 8 mg tPA was administered while retracting the 5 French catheter through the occluded graft. Next, from the retrograde access, a Fogarty balloon  was placed in 3 sequential arterial anastomotic balloon sweeps were  performed. There was restoration of pulsatility within the graft at this point. Next, from the antegrade direction, a 7 [place] cleaner device was inserted and 2 passes were made throughout the length of the occluded graft. Initial fistulagram was then performed from the antegrade access which demonstrated brisk antegrade flow through the looped portion of the graft with sluggish outflow near the venous anastomosis where there is associated severe stenosis. Therefore, balloon maceration and angioplasty was performed near the venous outflow aspect of the graft in into the native basilic vein. Repeat fistulagram was performed which demonstrated wide patency of the graft with evidence of active extravasation near the venous anastomosis into the biceps musculature. Therefore, wire access was obtained to a stenotic collateral into the basilic vein and under fluoroscopic guidance, a 7 mm x 7.5 cm Viatorr stent was placed. Completion venogram demonstrated patency with brisk antegrade flow through the brachial basilic graft with resolution of previously visualized muscular extravasation. The central veins were widely patent. Z stitches were placed at each access site in the sheath were removed successfully achieving near immediate hemostasis. A sterile bandage was applied at each access site. The patient tolerated the procedure well was transferred back to the floor in good condition. IMPRESSION: 1. Occluded brachial basilic arteriovenous loop graft in the right upper extremity. 2. Technically successful pharmacomechanical thrombectomy of right upper extremity arteriovenous graft. 3. Due to severe stenosis with vascular extravasation after angioplasty, the covered stent was placed at the region of the venous anastomosis. ACCESS: This access remains amenable to future percutaneous interventions as clinically indicated. Marliss Coots, MD [facility] Radiology Electronically  Signed   By: Marliss Coots M.D.   On: 04/15/2023 21:51   Medications:   calcium acetate  2,001 mg Oral TID WC   carvedilol  12.5 mg Oral BID WC   Chlorhexidine Gluconate Cloth  6 each Topical Q0600   hydrALAZINE  25 mg Oral TID   insulin aspart  0-9 Units Subcutaneous TID WC   isosorbide mononitrate  60 mg Oral Daily   lidocaine-EPINEPHrine  20 mL Infiltration Once   pantoprazole  40 mg Oral Daily     OP HD:TTS [facility]  4h  400/800  2/2.5 bath  RUA AVG    - Heparin 2000 units    Assessment/ Plan: Clotted R AVG - IR consulted for declot Monday, they have seen him today in preparation. No urgent HD need. K+ 4.5 11/25. Refused labs today. Refused to cooperate with declot procedure 04/14/2023.  consulted Palliative Care and psych. Declot of AVG 04/15/2023 refused HD last PM, refusing HD this AM. Be sure AVG is functional prior to DC Mild Hyperkalemia: K+ 5.6 but unsure when and if pt will agree to go to HD. Give Lokelma 10 grams PO this AM.  ESKD - on HD TTS. See above. Orders updated for 04/16/2023 HTN - BP's a bit high, cont home meds.  Volume - moderate LE edema which is typical, no resp issues which is typical Anemia of eskd - HGB 7.6 Aranesp 200 mcg SQ today. Draw iron panel with HD.  MBD ckd - CCa in range. Cont phoslo as binder w/ meals. PO4 added to today's labs.  GOC-Refused to talk to [facility] Remains full code, full scope of care.     Kaely Hollan H. Kayton Ripp NP-C 04/16/2023, 9:25 AM  [employer] [phone]

## 2023-04-16 NOTE — Progress Notes (Signed)
PROGRESS NOTE  JOCSAN RAUP WUJ:[id number] DOB: 1970/07/26 DOA: 04/11/2023 PCP: [name]   LOS: 4 days   Brief Narrative / Interim history: KENTO SATTAR is a 52 y.o. male with medical history significant for ESRD on TTS HD, chronic HFrEF (EF 20-25%), insulin-dependent T2DM, HTN, LBBB, anemia of chronic disease, medical non-adherence who presents to the ED with fatigue and malaise after missing 4 appointments with dialysis per report.   Subjective / 24h Interval events: Complains of pain in his arm  Assesement and Plan: Principal Problem:   ESRD on hemodialysis (HCC) Active Problems:   Clotted renal dialysis AV graft, initial encounter (HCC)   Chronic heart failure with reduced ejection fraction (HFrEF, <= 40%) (HCC)   Insulin dependent type 2 diabetes mellitus (HCC)   Hypertension associated with diabetes (HCC)   Anemia of chronic disease   Pulmonary nodules   Noncompliance of patient with renal dialysis (HCC)   Principal problem Profound noncompliance, goals of care -patient has persistent nonadherence to HD, medications, interventions complicates things and puts him at high risk of having a poor outcome.  Palliative care as well as psychiatry consulted and evaluated patient, but he refuses to participate in discussing with the consultants.   Active problems ESRD on TTS HD, RUE AVG occlusion - Noncompliance with treatment complicating patient's clinical course having missed 4 (or possibly more) HD treatment sessions.  Nephrology, vascular and IR consulted, he repeatedly refused fistula repair but eventually agreed then he is status post declotting of the right brachiobasilic AV graft on 12/6.  He also had a venous anastomotic covered stent.  Procedure resulted in right biceps muscle hematoma -Patient can be discharged home if he tolerates dialysis but has been persistently refusing to get dialysis.  Discussed with nephrology, now appears agreeable to have dialysis this  evening  Chronic HFrEF - Continue Coreg. Volume managed with HD.   Insulin-dependent type 2 diabetes - Placed on SSI.   Hypertension - Continue Coreg, hydralazine, Imdur.   Anemia of chronic disease - Hemoglobin stable per previous labs   Pulmonary nodules - Stable bilateral pulmonary nodules noted on CXR, follow up CT with PCP  Scheduled Meds:  calcium acetate  2,001 mg Oral TID WC   carvedilol  12.5 mg Oral BID WC   Chlorhexidine Gluconate Cloth  6 each Topical Q0600   darbepoetin (ARANESP) injection - DIALYSIS  200 mcg Subcutaneous Once   hydrALAZINE  25 mg Oral TID   insulin aspart  0-9 Units Subcutaneous TID WC   isosorbide mononitrate  60 mg Oral Daily   lidocaine-EPINEPHrine  20 mL Infiltration Once   pantoprazole  40 mg Oral Daily   sodium zirconium cyclosilicate  10 g Oral Once   Continuous Infusions: PRN Meds:.acetaminophen **OR** acetaminophen, hydrALAZINE, melatonin, naLOXone (NARCAN)  injection, ondansetron **OR** ondansetron (ZOFRAN) IV, senna-docusate  Current Outpatient Medications  Medication Instructions   Acetaminophen Extra Strength 500 mg, Oral, Every 8 hours PRN   amoxicillin (AMOXIL) 500 mg, Oral, 2 times daily   Aspirin Low Dose 81 mg, Oral, Daily   azithromycin (ZITHROMAX) 250 MG tablet Take first 2 tablets by mouth together, then take 1 tablet every day until finished.   calcitRIOL (ROCALTROL) 0.25 mcg, Oral, Every T-Th-Sa (1800)   calcium acetate (PHOSLO) 2,001 mg, Oral, 3 times daily   carvedilol (COREG) 12.5 MG tablet Take 1 tablet by mouth twice a day with meals   doxycycline (VIBRA-TABS) 100 mg, Oral, 2 times daily   glucose blood test strip  Use to check fasting blood sugar once daily. diag code E11.65. Insulin dependent   HUMALOG KWIKPEN 100 UNIT/ML KwikPen Subcutaneous   hydrALAZINE (APRESOLINE) 25 mg, Oral, 3 times daily   insulin aspart (NOVOLOG FLEXPEN) 100 UNIT/ML FlexPen 0-9 Units, Subcutaneous, 3 times daily with meals CBG < 70: Implement  Hypoglycemia measures CBG 70 - 120: 0 units CBG 121 - 150: 1 unit CBG 151 - 200: 2 units CBG 201 - 250: 3 units CBG 251 - 300: 5 units CBG 301 - 350: 7 units CBG 351 - 400: 9 units CBG > 400: call MD   insulin glargine-yfgn (SEMGLEE) 7 Units, Subcutaneous, Daily at bedtime   Insulin Pen Needle 32G X 4 MM MISC Use with insulin pens   isosorbide mononitrate (IMDUR) 60 mg, Oral, Daily   melatonin 3 mg, Oral, At bedtime PRN   mupirocin ointment (BACTROBAN) 2 % 1 Application, Topical, 3 times daily   ondansetron (ZOFRAN-ODT) 4 MG disintegrating tablet Take 1 tablet (4 mg total) by mouth every 4 (four) hours as needed for nausea/vomiting.   Protonix 40 mg, Oral, Daily   simethicone (MYLICON) 80 mg, Oral, 4 times daily    Diet Orders (From admission, onward)     Start     Ordered   04/15/23 1745  Diet renal with fluid restriction Fluid restriction: 1200 mL Fluid; Room service appropriate? Yes; Fluid consistency: Thin  Diet effective now       Question Answer Comment  Fluid restriction: 1200 mL Fluid   Room service appropriate? Yes   Fluid consistency: Thin      04/15/23 1744            DVT prophylaxis: SCDs Start: 04/12/23 0027   Lab Results  Component Value Date   PLT 324 04/16/2023      Code Status: Full Code  Family Communication: no family at bedside   Status is: Inpatient Remains inpatient appropriate because: severity of illness  Level of care: Telemetry Cardiac  Consultants:  Nephrology Vascular IR  Objective: Vitals:   04/15/23 1650 04/15/23 1655 04/15/23 1700 04/15/23 1710  BP: 133/81 137/86 137/76 124/64  Pulse: (!) 59 60 60 62  Resp: 12 15 18 12   Temp:      TempSrc:      SpO2: 100% 100% 100% 97%  Weight:        Intake/Output Summary (Last 24 hours) at 04/16/2023 1225 Last data filed at 04/15/2023 1700 Gross per 24 hour  Intake 220 ml  Output --  Net 220 ml   Wt Readings from Last 3 Encounters:  04/15/23 82.6 kg  03/29/23 82 kg  03/20/23 82  kg    Examination:  Constitutional: NAD Eyes: no scleral icterus ENMT: Mucous membranes are moist.  Neck: normal, supple Respiratory: clear to auscultation bilaterally, no wheezing, no crackles.  Cardiovascular: Regular rate and rhythm, no murmurs / rubs / gallops.  Abdomen: non distended, no tenderness. Bowel sounds positive.  Musculoskeletal: no clubbing / cyanosis.    Data Reviewed: I have independently reviewed following labs and imaging studies   CBC Recent Labs  Lab 04/11/23 1329 04/16/23 0531  WBC 6.9 7.2  HGB 10.9* 7.6*  HCT 35.3* 23.7*  PLT 454* 324  MCV 82.9 79.5*  MCH 25.6* 25.5*  MCHC 30.9 32.1  RDW 19.6* 19.0*    Recent Labs  Lab 04/11/23 1329 04/13/23 1404 04/14/23 1004 04/16/23 0531  NA 138 136 134* 134*  K 4.3 4.9 4.5 5.6*  CL 97* 97* 96*  99  CO2 18* 15* 18* 14*  GLUCOSE 113* 179* 160* 107*  BUN 108* 117* 120* 132*  CREATININE 18.37* 18.99* 19.44* 20.69*  CALCIUM 7.8* 7.1* 6.9* 7.0*  INR 1.3*  --   --   --     ------------------------------------------------------------------------------------------------------------------ No results for input(s): "CHOL", "HDL", "LDLCALC", "TRIG", "CHOLHDL", "LDLDIRECT" in the last 72 hours.  Lab Results  Component Value Date   HGBA1C 7.3 (H) 01/31/2023   ------------------------------------------------------------------------------------------------------------------ No results for input(s): "TSH", "T4TOTAL", "T3FREE", "THYROIDAB" in the last 72 hours.  Invalid input(s): "FREET3"  Cardiac Enzymes No results for input(s): "CKMB", "TROPONINI", "MYOGLOBIN" in the last 168 hours.  Invalid input(s): "CK" ------------------------------------------------------------------------------------------------------------------    Component Value Date/Time   BNP >4,500.0 (H) 03/19/2023 0619    CBG: Recent Labs  Lab 04/11/23 1314 04/13/23 1212 04/14/23 1820 04/14/23 2042 04/15/23 0930  GLUCAP 136* 118*  141* 78 74    No results found for this or any previous visit (from the past 240 hour(s)).   Radiology Studies: IR US Guide Vasc Access Right  Result Date: 04/15/2023 INDICATION: 52 year old male with history of end-stage renal disease and right upper extremity arteriovenous graft presenting with graft occlusion. EXAM: 1. Ultrasound-guided vascular access of the right upper extremity graft x2. 2. Right upper extremity arteriovenous graft fistulagram. 3. Pharmacomechanical thrombectomy of right upper extremity arteriovenous graft. 4. Covered self expanding stent placement. MEDICATIONS: 8 mg tPA, intravenous ANESTHESIA/SEDATION: Moderate (conscious) sedation was employed during this procedure. A total of Versed 2 mg and Fentanyl 100 mcg was administered intravenously. Moderate Sedation Time: 74 minutes. The patient's level of consciousness and vital signs were monitored continuously by radiology nursing throughout the procedure under my direct supervision. FLUOROSCOPY TIME:  Twenty-six mGy COMPLICATIONS: None immediate. PROCEDURE: Informed written consent was obtained from the patient after a thorough discussion of the procedural risks, benefits and alternatives. All questions were addressed. Maximal Sterile Barrier Technique was utilized including caps, mask, sterile gowns, sterile gloves, sterile drape, hand hygiene and skin antiseptic. A timeout was performed prior to the initiation of the procedure. Preprocedure ultrasound evaluation of the right upper extremity graft demonstrated brachial arterial anastomosis in the distal upper arm with occluded graft throughout originating at the anastomosis. The procedure was planned. First, to obtain antegrade access, a target several cm from the arterial anastomosis was targeted. Subdermal Local anesthesia was provided with 1% lidocaine. A small skin nick was made. In the antegrade direction, a 21 gauge micropuncture needle was introduced into the graft under direct  ultrasound visualization. A permanent ultrasound image was captured and stored in the record. Micropuncture sheath introduced followed by placement of a Wholey wire which was directed to the central veins under fluoroscopic guidance. The sheath was exchanged for a 7 [place], short vascular sheath. Over the wire, an angled 5 French catheter was inserted and directed to the level of the subclavian vein. The wire was removed. Contrast was injected watts retracting the catheter which demonstrated patency of the right subclavian and axillary veins with luminal irregularity in thrombus originating in the mid upper arm at the site of venous graft anastomosis. Next, retrograde access was then obtained. Subdermal Local anesthesia was provided with 1% lidocaine at the planned needle entry site. A small skin nick was made. In the retrograde direction, a 21 gauge micropuncture needle was then introduced into the graft under direct ultrasound visualization. A permanent ultrasound image was captured and stored in the record. A micropuncture sheath was introduced followed by placement of a Wholey wire  which was directed into the brachial artery. The micropuncture sheath was exchanged for a short 6 [place] vascular sheath. A Kumpe the catheter was placed over the wire which was removed and limited right upper extremity angiogram was performed which demonstrated occlusion of the indwelling graft and patency of the visualized brachial, radial, and ulnar arteries. From the antegrade access, 8 mg tPA was administered while retracting the 5 French catheter through the occluded graft. Next, from the retrograde access, a Fogarty balloon was placed in 3 sequential arterial anastomotic balloon sweeps were performed. There was restoration of pulsatility within the graft at this point. Next, from the antegrade direction, a 7 [place] cleaner device was inserted and 2 passes were made throughout the length of the occluded graft. Initial  fistulagram was then performed from the antegrade access which demonstrated brisk antegrade flow through the looped portion of the graft with sluggish outflow near the venous anastomosis where there is associated severe stenosis. Therefore, balloon maceration and angioplasty was performed near the venous outflow aspect of the graft in into the native basilic vein. Repeat fistulagram was performed which demonstrated wide patency of the graft with evidence of active extravasation near the venous anastomosis into the biceps musculature. Therefore, wire access was obtained to a stenotic collateral into the basilic vein and under fluoroscopic guidance, a 7 mm x 7.5 cm Viatorr stent was placed. Completion venogram demonstrated patency with brisk antegrade flow through the brachial basilic graft with resolution of previously visualized muscular extravasation. The central veins were widely patent. Z stitches were placed at each access site in the sheath were removed successfully achieving near immediate hemostasis. A sterile bandage was applied at each access site. The patient tolerated the procedure well was transferred back to the floor in good condition. IMPRESSION: 1. Occluded brachial basilic arteriovenous loop graft in the right upper extremity. 2. Technically successful pharmacomechanical thrombectomy of right upper extremity arteriovenous graft. 3. Due to severe stenosis with vascular extravasation after angioplasty, the covered stent was placed at the region of the venous anastomosis. ACCESS: This access remains amenable to future percutaneous interventions as clinically indicated. Marliss Coots, MD [facility] Radiology Electronically Signed   By: Marliss Coots M.D.   On: 04/15/2023 21:51   IR THROMBECTOMY AV FISTULA W/THROMBOLYSIS/PTA/STENT INC SHUNT/IMG RT  Result Date: 04/15/2023 INDICATION: 52 year old male with history of end-stage renal disease and right  upper extremity arteriovenous graft presenting with graft occlusion. EXAM: 1. Ultrasound-guided vascular access of the right upper extremity graft x2. 2. Right upper extremity arteriovenous graft fistulagram. 3. Pharmacomechanical thrombectomy of right upper extremity arteriovenous graft. 4. Covered self expanding stent placement. MEDICATIONS: 8 mg tPA, intravenous ANESTHESIA/SEDATION: Moderate (conscious) sedation was employed during this procedure. A total of Versed 2 mg and Fentanyl 100 mcg was administered intravenously. Moderate Sedation Time: 74 minutes. The patient's level of consciousness and vital signs were monitored continuously by radiology nursing throughout the procedure under my direct supervision. FLUOROSCOPY TIME:  Twenty-six mGy COMPLICATIONS: None immediate. PROCEDURE: Informed written consent was obtained from the patient after a thorough discussion of the procedural risks, benefits and alternatives. All questions were addressed. Maximal Sterile Barrier Technique was utilized including caps, mask, sterile gowns, sterile gloves, sterile drape, hand hygiene and skin antiseptic. A timeout was performed prior to the initiation of the procedure. Preprocedure ultrasound evaluation of the right upper extremity graft demonstrated brachial arterial anastomosis in the distal upper arm with occluded graft throughout originating at the anastomosis. The procedure was planned.  First, to obtain antegrade access, a target several cm from the arterial anastomosis was targeted. Subdermal Local anesthesia was provided with 1% lidocaine. A small skin nick was made. In the antegrade direction, a 21 gauge micropuncture needle was introduced into the graft under direct ultrasound visualization. A permanent ultrasound image was captured and stored in the record. Micropuncture sheath introduced followed by placement of a Wholey wire which was directed to the central veins under fluoroscopic guidance. The sheath was  exchanged for a 7 [place], short vascular sheath. Over the wire, an angled 5 French catheter was inserted and directed to the level of the subclavian vein. The wire was removed. Contrast was injected watts retracting the catheter which demonstrated patency of the right subclavian and axillary veins with luminal irregularity in thrombus originating in the mid upper arm at the site of venous graft anastomosis. Next, retrograde access was then obtained. Subdermal Local anesthesia was provided with 1% lidocaine at the planned needle entry site. A small skin nick was made. In the retrograde direction, a 21 gauge micropuncture needle was then introduced into the graft under direct ultrasound visualization. A permanent ultrasound image was captured and stored in the record. A micropuncture sheath was introduced followed by placement of a Wholey wire which was directed into the brachial artery. The micropuncture sheath was exchanged for a short 6 [place] vascular sheath. A Kumpe the catheter was placed over the wire which was removed and limited right upper extremity angiogram was performed which demonstrated occlusion of the indwelling graft and patency of the visualized brachial, radial, and ulnar arteries. From the antegrade access, 8 mg tPA was administered while retracting the 5 French catheter through the occluded graft. Next, from the retrograde access, a Fogarty balloon was placed in 3 sequential arterial anastomotic balloon sweeps were performed. There was restoration of pulsatility within the graft at this point. Next, from the antegrade direction, a 7 [place] cleaner device was inserted and 2 passes were made throughout the length of the occluded graft. Initial fistulagram was then performed from the antegrade access which demonstrated brisk antegrade flow through the looped portion of the graft with sluggish outflow near the venous anastomosis where there is associated severe stenosis. Therefore, balloon maceration  and angioplasty was performed near the venous outflow aspect of the graft in into the native basilic vein. Repeat fistulagram was performed which demonstrated wide patency of the graft with evidence of active extravasation near the venous anastomosis into the biceps musculature. Therefore, wire access was obtained to a stenotic collateral into the basilic vein and under fluoroscopic guidance, a 7 mm x 7.5 cm Viatorr stent was placed. Completion venogram demonstrated patency with brisk antegrade flow through the brachial basilic graft with resolution of previously visualized muscular extravasation. The central veins were widely patent. Z stitches were placed at each access site in the sheath were removed successfully achieving near immediate hemostasis. A sterile bandage was applied at each access site. The patient tolerated the procedure well was transferred back to the floor in good condition. IMPRESSION: 1. Occluded brachial basilic arteriovenous loop graft in the right upper extremity. 2. Technically successful pharmacomechanical thrombectomy of right upper extremity arteriovenous graft. 3. Due to severe stenosis with vascular extravasation after angioplasty, the covered stent was placed at the region of the venous anastomosis. ACCESS: This access remains amenable to future percutaneous interventions as clinically indicated. Marliss Coots, MD [facility] Radiology Electronically Signed   By: Marliss Coots M.D.   On:  04/15/2023 21:51     Pamella Pert, MD, PhD [facility]  Between 7 am - 7 pm I am available, please contact me via Amion (for emergencies) or Securechat (non urgent messages)  Between 7 pm - 7 am I am not available, please contact night coverage MD/APP via Amion

## 2023-04-17 ENCOUNTER — Emergency Department (HOSPITAL_COMMUNITY)
Admission: EM | Admit: 2023-04-17 | Discharge: 2023-04-17 | Discharge status: Against Medical Advice | Payer: 59 | Attending: Emergency Medicine | Admitting: Emergency Medicine

## 2023-04-17 ENCOUNTER — Other Ambulatory Visit: Payer: Self-pay

## 2023-04-17 DIAGNOSIS — M79603 Pain in arm, unspecified: Secondary | ICD-10-CM | POA: Insufficient documentation

## 2023-04-17 DIAGNOSIS — Z5321 Procedure and treatment not carried out due to patient leaving prior to being seen by health care provider: Secondary | ICD-10-CM | POA: Diagnosis not present

## 2023-04-17 DIAGNOSIS — N186 End stage renal disease: Secondary | ICD-10-CM | POA: Diagnosis not present

## 2023-04-17 DIAGNOSIS — Z992 Dependence on renal dialysis: Secondary | ICD-10-CM | POA: Diagnosis not present

## 2023-04-17 NOTE — ED Notes (Signed)
Pt stated "did not want to be seen. Pt also stated only wanted to be in here to stay from being outside while ride gets here. Pt seen getting into car by security".

## 2023-04-17 NOTE — Progress Notes (Signed)
[facility] Progress Note   Subjective: patient seen and examined bedside this am. He again refused dialysis last night/overnight despite my conversation with him. He reports that arm feels better, just hurts when he is trying to get out of bed. He is being discharged today.  Objective Vitals:   04/15/23 1650 04/15/23 1655 04/15/23 1700 04/15/23 1710  Pulse: (!) 59 60 60 62  Resp: 12 15 18 12   Temp:      TempSrc:      SpO2: 100% 100% 100% 97%  Weight:       Physical Exam General: Chronically ill disheveled male, NAD Heart: RRR Lungs: CTA BL, normal wob Abdomen: protuberant Extremities:2+ BLE edema Dialysis Access: R AVG +b/t, 1+ radial pulse right, bicep site softer today  Additional Objective Labs: Basic Metabolic Panel: Recent Labs  Lab 04/13/23 1404 04/14/23 1004 04/16/23 0531  NA 136 134* 134*  K 4.9 4.5 5.6*  CL 97* 96* 99  CO2 15* 18* 14*  GLUCOSE 179* 160* 107*  BUN 117* 120* 132*  CREATININE 18.99* 19.44* 20.69*  CALCIUM 7.1* 6.9* 7.0*   Liver Function Tests: No results for input(s): "AST", "ALT", "ALKPHOS", "BILITOT", "PROT", "ALBUMIN" in the last 168 hours. No results for input(s): "LIPASE", "AMYLASE" in the last 168 hours. CBC: Recent Labs  Lab 04/11/23 1329 04/16/23 0531  WBC 6.9 7.2  HGB 10.9* 7.6*  HCT 35.3* 23.7*  MCV 82.9 79.5*  PLT 454* 324   Blood Culture    Component Value Date/Time   SDES BLOOD LEFT HAND 03/15/2023 2355   SDES BLOOD LEFT FOREARM 03/15/2023 2355   SPECREQUEST  03/15/2023 2355    BOTTLES DRAWN AEROBIC AND ANAEROBIC Blood Culture adequate volume   SPECREQUEST  03/15/2023 2355    BOTTLES DRAWN AEROBIC AND ANAEROBIC Blood Culture adequate volume   CULT  03/15/2023 2355    NO GROWTH 5 DAYS Performed at [facility], [address] [place], [place] [address]    CULT  03/15/2023 2355    NO GROWTH 5 DAYS Performed at [facility], [address], [place], [place] [address]    REPTSTATUS  03/21/2023 FINAL 03/15/2023 2355   REPTSTATUS 03/21/2023 FINAL 03/15/2023 2355    Cardiac Enzymes: No results for input(s): "CKTOTAL", "CKMB", "CKMBINDEX", "TROPONINI" in the last 168 hours. CBG: Recent Labs  Lab 04/11/23 1314 04/13/23 1212 04/14/23 1820 04/14/23 2042 04/15/23 0930  GLUCAP 136* 118* 141* 78 74   Iron Studies: No results for input(s): "IRON", "TIBC", "TRANSFERRIN", "FERRITIN" in the last 72 hours. @lablastinr3 @ Studies/Results: IR US Guide Vasc Access Right  Result Date: 04/15/2023 INDICATION: 52 year old male with history of end-stage renal disease and right upper extremity arteriovenous graft presenting with graft occlusion. EXAM: 1. Ultrasound-guided vascular access of the right upper extremity graft x2. 2. Right upper extremity arteriovenous graft fistulagram. 3. Pharmacomechanical thrombectomy of right upper extremity arteriovenous graft. 4. Covered self expanding stent placement. MEDICATIONS: 8 mg tPA, intravenous ANESTHESIA/SEDATION: Moderate (conscious) sedation was employed during this procedure. A total of Versed 2 mg and Fentanyl 100 mcg was administered intravenously. Moderate Sedation Time: 74 minutes. The patient's level of consciousness and vital signs were monitored continuously by radiology nursing throughout the procedure under my direct supervision. FLUOROSCOPY TIME:  Twenty-six mGy COMPLICATIONS: None immediate. PROCEDURE: Informed written consent was obtained from the patient after a thorough discussion of the procedural risks, benefits and alternatives. All questions were addressed. Maximal Sterile Barrier Technique was utilized including caps, mask, sterile gowns, sterile  gloves, sterile drape, hand hygiene and skin antiseptic. A timeout was performed prior to the initiation of the procedure. Preprocedure ultrasound evaluation of the right upper extremity graft demonstrated brachial arterial anastomosis in the distal upper arm with occluded graft  throughout originating at the anastomosis. The procedure was planned. First, to obtain antegrade access, a target several cm from the arterial anastomosis was targeted. Subdermal Local anesthesia was provided with 1% lidocaine. A small skin nick was made. In the antegrade direction, a 21 gauge micropuncture needle was introduced into the graft under direct ultrasound visualization. A permanent ultrasound image was captured and stored in the record. Micropuncture sheath introduced followed by placement of a Wholey wire which was directed to the central veins under fluoroscopic guidance. The sheath was exchanged for a 7 [place], short vascular sheath. Over the wire, an angled 5 French catheter was inserted and directed to the level of the subclavian vein. The wire was removed. Contrast was injected watts retracting the catheter which demonstrated patency of the right subclavian and axillary veins with luminal irregularity in thrombus originating in the mid upper arm at the site of venous graft anastomosis. Next, retrograde access was then obtained. Subdermal Local anesthesia was provided with 1% lidocaine at the planned needle entry site. A small skin nick was made. In the retrograde direction, a 21 gauge micropuncture needle was then introduced into the graft under direct ultrasound visualization. A permanent ultrasound image was captured and stored in the record. A micropuncture sheath was introduced followed by placement of a Wholey wire which was directed into the brachial artery. The micropuncture sheath was exchanged for a short 6 [place] vascular sheath. A Kumpe the catheter was placed over the wire which was removed and limited right upper extremity angiogram was performed which demonstrated occlusion of the indwelling graft and patency of the visualized brachial, radial, and ulnar arteries. From the antegrade access, 8 mg tPA was administered while retracting the 5 French catheter through the occluded graft.  Next, from the retrograde access, a Fogarty balloon was placed in 3 sequential arterial anastomotic balloon sweeps were performed. There was restoration of pulsatility within the graft at this point. Next, from the antegrade direction, a 7 [place] cleaner device was inserted and 2 passes were made throughout the length of the occluded graft. Initial fistulagram was then performed from the antegrade access which demonstrated brisk antegrade flow through the looped portion of the graft with sluggish outflow near the venous anastomosis where there is associated severe stenosis. Therefore, balloon maceration and angioplasty was performed near the venous outflow aspect of the graft in into the native basilic vein. Repeat fistulagram was performed which demonstrated wide patency of the graft with evidence of active extravasation near the venous anastomosis into the biceps musculature. Therefore, wire access was obtained to a stenotic collateral into the basilic vein and under fluoroscopic guidance, a 7 mm x 7.5 cm Viatorr stent was placed. Completion venogram demonstrated patency with brisk antegrade flow through the brachial basilic graft with resolution of previously visualized muscular extravasation. The central veins were widely patent. Z stitches were placed at each access site in the sheath were removed successfully achieving near immediate hemostasis. A sterile bandage was applied at each access site. The patient tolerated the procedure well was transferred back to the floor in good condition. IMPRESSION: 1. Occluded brachial basilic arteriovenous loop graft in the right upper extremity. 2. Technically successful pharmacomechanical thrombectomy of right upper extremity arteriovenous graft. 3. Due to severe stenosis with vascular  extravasation after angioplasty, the covered stent was placed at the region of the venous anastomosis. ACCESS: This access remains amenable to future percutaneous interventions as clinically  indicated. Marliss Coots, MD [facility] Radiology Electronically Signed   By: Marliss Coots M.D.   On: 04/15/2023 21:51   IR THROMBECTOMY AV FISTULA W/THROMBOLYSIS/PTA/STENT INC SHUNT/IMG RT  Result Date: 04/15/2023 INDICATION: 52 year old male with history of end-stage renal disease and right upper extremity arteriovenous graft presenting with graft occlusion. EXAM: 1. Ultrasound-guided vascular access of the right upper extremity graft x2. 2. Right upper extremity arteriovenous graft fistulagram. 3. Pharmacomechanical thrombectomy of right upper extremity arteriovenous graft. 4. Covered self expanding stent placement. MEDICATIONS: 8 mg tPA, intravenous ANESTHESIA/SEDATION: Moderate (conscious) sedation was employed during this procedure. A total of Versed 2 mg and Fentanyl 100 mcg was administered intravenously. Moderate Sedation Time: 74 minutes. The patient's level of consciousness and vital signs were monitored continuously by radiology nursing throughout the procedure under my direct supervision. FLUOROSCOPY TIME:  Twenty-six mGy COMPLICATIONS: None immediate. PROCEDURE: Informed written consent was obtained from the patient after a thorough discussion of the procedural risks, benefits and alternatives. All questions were addressed. Maximal Sterile Barrier Technique was utilized including caps, mask, sterile gowns, sterile gloves, sterile drape, hand hygiene and skin antiseptic. A timeout was performed prior to the initiation of the procedure. Preprocedure ultrasound evaluation of the right upper extremity graft demonstrated brachial arterial anastomosis in the distal upper arm with occluded graft throughout originating at the anastomosis. The procedure was planned. First, to obtain antegrade access, a target several cm from the arterial anastomosis was targeted. Subdermal Local anesthesia was provided with 1% lidocaine. A small skin nick was made. In the  antegrade direction, a 21 gauge micropuncture needle was introduced into the graft under direct ultrasound visualization. A permanent ultrasound image was captured and stored in the record. Micropuncture sheath introduced followed by placement of a Wholey wire which was directed to the central veins under fluoroscopic guidance. The sheath was exchanged for a 7 [place], short vascular sheath. Over the wire, an angled 5 French catheter was inserted and directed to the level of the subclavian vein. The wire was removed. Contrast was injected watts retracting the catheter which demonstrated patency of the right subclavian and axillary veins with luminal irregularity in thrombus originating in the mid upper arm at the site of venous graft anastomosis. Next, retrograde access was then obtained. Subdermal Local anesthesia was provided with 1% lidocaine at the planned needle entry site. A small skin nick was made. In the retrograde direction, a 21 gauge micropuncture needle was then introduced into the graft under direct ultrasound visualization. A permanent ultrasound image was captured and stored in the record. A micropuncture sheath was introduced followed by placement of a Wholey wire which was directed into the brachial artery. The micropuncture sheath was exchanged for a short 6 [place] vascular sheath. A Kumpe the catheter was placed over the wire which was removed and limited right upper extremity angiogram was performed which demonstrated occlusion of the indwelling graft and patency of the visualized brachial, radial, and ulnar arteries. From the antegrade access, 8 mg tPA was administered while retracting the 5 French catheter through the occluded graft. Next, from the retrograde access, a Fogarty balloon was placed in 3 sequential arterial anastomotic balloon sweeps were performed. There was restoration of pulsatility within the graft at this point. Next, from the antegrade direction, a 7 [place] cleaner device was  inserted and  2 passes were made throughout the length of the occluded graft. Initial fistulagram was then performed from the antegrade access which demonstrated brisk antegrade flow through the looped portion of the graft with sluggish outflow near the venous anastomosis where there is associated severe stenosis. Therefore, balloon maceration and angioplasty was performed near the venous outflow aspect of the graft in into the native basilic vein. Repeat fistulagram was performed which demonstrated wide patency of the graft with evidence of active extravasation near the venous anastomosis into the biceps musculature. Therefore, wire access was obtained to a stenotic collateral into the basilic vein and under fluoroscopic guidance, a 7 mm x 7.5 cm Viatorr stent was placed. Completion venogram demonstrated patency with brisk antegrade flow through the brachial basilic graft with resolution of previously visualized muscular extravasation. The central veins were widely patent. Z stitches were placed at each access site in the sheath were removed successfully achieving near immediate hemostasis. A sterile bandage was applied at each access site. The patient tolerated the procedure well was transferred back to the floor in good condition. IMPRESSION: 1. Occluded brachial basilic arteriovenous loop graft in the right upper extremity. 2. Technically successful pharmacomechanical thrombectomy of right upper extremity arteriovenous graft. 3. Due to severe stenosis with vascular extravasation after angioplasty, the covered stent was placed at the region of the venous anastomosis. ACCESS: This access remains amenable to future percutaneous interventions as clinically indicated. Marliss Coots, MD [facility] Radiology Electronically Signed   By: Marliss Coots M.D.   On: 04/15/2023 21:51   Medications:   calcium acetate  2,001 mg Oral TID WC   carvedilol  12.5 mg Oral BID WC    Chlorhexidine Gluconate Cloth  6 each Topical Q0600   darbepoetin (ARANESP) injection - DIALYSIS  200 mcg Subcutaneous Once   hydrALAZINE  25 mg Oral TID   insulin aspart  0-9 Units Subcutaneous TID WC   isosorbide mononitrate  60 mg Oral Daily   lidocaine-EPINEPHrine  20 mL Infiltration Once   pantoprazole  40 mg Oral Daily   sodium zirconium cyclosilicate  10 g Oral Once     OP HD:TTS [facility]  4h  400/800  2/2.5 bath  RUA AVG    - Heparin 2000 units    Assessment/ Plan: Clotted R AVG - had been noncompliant with IR. Eventually had declot and venous anastomotic covered stent placement on 11/26. Complications: right biceps hematoma. Low suspicion for compartment syndrome based on brief exam this AM Mild Hyperkalemia: K+ 5.6 but unsure when and if pt will agree to go to HD. S/p lokelma ESKD - on HD TTS. See above. Orders updated for 04/18/2023 if he is still here HTN - BP's a bit high, cont home meds.  Volume - moderate LE edema which is typical, no resp issues which is typical Anemia of eskd - HGB 7.6, s/p Aranesp 200 mcg SQ. Draw iron panel with HD.  MBD ckd - CCa in range. Cont phoslo as binder w/ meals. Check PO4 if still here GOC-Refused to talk to [facility]. Remains full code, full scope of care.   Given issues with noncompliance, there is not much I have to offer him other than dialysis which he is refusing. Okay for discharge from a nephrology perspective, advised him to proceed to his dialysis unit tomorrow as scheduled. Discussed with primary service.    Anthony Sar, MD [facility]

## 2023-04-17 NOTE — Discharge Summary (Signed)
Physician Discharge Summary  [name] FAO:[id number] DOB: Oct 13, 1970 DOA: 04/11/2023  PCP: [name]  Admit date: 04/11/2023 Discharge date: 04/17/2023  Admitted From: home Disposition:  home  Recommendations for Outpatient Follow-up:  Follow up with PCP in 1-2 weeks Follow-up with outpatient dialysis 1 day following discharge as scheduled  Home Health: none Equipment/Devices: none  Discharge Condition: stable CODE STATUS: Full code Diet Orders (From admission, onward)     Start     Ordered   04/15/23 1745  Diet renal with fluid restriction Fluid restriction: 1200 mL Fluid; Room service appropriate? Yes; Fluid consistency: Thin  Diet effective now       Question Answer Comment  Fluid restriction: 1200 mL Fluid   Room service appropriate? Yes   Fluid consistency: Thin      04/15/23 1744            HPI: Per admitting MD, [name] is a 52 y.o. male with medical history significant for ESRD on TTS HD, chronic HFrEF (EF 20-25%), insulin-dependent T2DM, HTN, LBBB, anemia of chronic disease, medical nonadherence who presented to the ED for fatigue and missed dialysis.  History and exam limited due to patient cooperation. Patient presented to the ED for evaluation of fatigue.  He reported missing at least the last 4 dialysis sessions but does not state exactly when he last had a full HD session.  He says he has fluid building up in his legs.  He reported that his fistula was not working and that he has not been going to dialysis due to transportation issues.  He denies chest pain and dyspnea.  He says he no longer makes urine.  Patient has been refusing nursing care including vital checks and lab draws.  Hospital Course / Discharge diagnoses: Principal Problem:   ESRD on hemodialysis [facility]) Active Problems:   Clotted renal dialysis AV graft, initial encounter (HCC)   Chronic heart failure with reduced ejection fraction (HFrEF, <= 40%) (HCC)   Insulin  dependent type 2 diabetes mellitus (HCC)   Hypertension associated with diabetes (HCC)   Anemia of chronic disease   Pulmonary nodules   Noncompliance of patient with renal dialysis (HCC)  Principal problem Profound noncompliance, goals of care -patient has persistent nonadherence to HD, medications, interventions complicates things and puts him at high risk of having a poor outcome.  Palliative care as well as psychiatry consulted and evaluated patient, but he refuses to participate in discussing with the consultants.   Active problems ESRD on TTS HD, RUE AVG occlusion - Noncompliance with treatment complicating patient's clinical course having missed 4 (or possibly more) HD treatment sessions.  Nephrology, vascular and IR consulted, he repeatedly refused fistula repair but eventually agreed then he is status post declotting of the right brachiobasilic AV graft on 12/6.  He also had a venous anastomotic covered stent.  Procedure was complicated by a right biceps muscle hematoma.  Following the declotting, he has been persistently refusing all medical interventions including vital signs, refused to go to HD at least 3 times in the last 24 hours and is not considering, given persistent refusal of treatment he will be discharged home.  He is stable, appears at baseline, on room air, and has scheduled outpatient HD tomorrow Chronic HFrEF - Continue Coreg. Volume managed with HD. Insulin-dependent type 2 diabetes -continue home medications  Hypertension - Continue Coreg, hydralazine, Imdur. Anemia of chronic disease - Hemoglobin stable per previous labs Pulmonary nodules - Stable bilateral pulmonary nodules  noted on CXR, follow up CT with PCP  Sepsis ruled out   Discharge Instructions   Allergies as of 04/17/2023       Reactions   Sulfa Antibiotics Rash   Severe rash. Severe rash.     Severe rash.   Other Other (See Comments)   Patient states he is allergic to the TB test - unknown  reaction during childhood    Bactrim [sulfamethoxazole-trimethoprim] Rash   Cephalexin Other (See Comments), Rash   Patient with severe drug reaction including exfoliating skin rash and hypotension after being prescribed both cephalexin and sulfamethoxazole-trimethoprim simultaneously. Favor SMX as much more likely culprit as patient had tolerated other cephalosporins in the past, but cannot say with complete certainty that this was not related to cephalexin. Other Reaction(s): Other, Other (See Comments) Patient with severe drug reaction including exfoliating skin rash and hypotension after being prescribed both cephalexin and sulfamethoxazole-trimethoprim simultaneously. Favor SMX as much more likely culprit as patient had tolerated other cephalosporins in the past, but cannot say with complete certainty that this was not related to cephalexin.   "Patient with severe drug reaction including exfoliating skin rash and hypotension after being prescribed both cephalexin and sulfamethoxazole-trimethoprim simultaneously. Favor SMX as much more likely culprit as patient had tolerated other cephalosporins in the past, but cannot say with complete certainty that this was not related to cephalexin." As noted in [facility]   "Patient with severe drug reaction including exfoliating skin rash and hypotension after being prescribed both cephalexin and sulfamethoxazole-trimethoprim simultaneously. Favor SMX as much more likely culprit as patient had tolerated other cephalosporins in the past, but cannot say with complete certainty that this was not related to cephalexin." As noted in [facility]  "Patient with severe drug reaction including exfoliating skin rash and hypotension after being prescribed both cephalexin and sulfamethoxazole-trimethoprim simultaneously. Favor SMX as much more likely culprit as patient had tolerated other cephalosporins in the past, but cannot say with complete certainty that this was not  related to cephalexin." As noted in [facility]     Patient with severe drug reaction including exfoliating skin rash and hypotension after being prescribed both cephalexin and sulfamethoxazole-trimethoprim simultaneously. Favor SMX as much more likely culprit as patient had tolerated other cephalosporins in the past, but cannot say with complete certainty that this was not related to cephalexin.  "Patient with severe drug reaction including exfoliating skin rash and hypotension after being prescribed both cephalexin and sulfamethoxazole-trimethoprim simultaneously. Favor SMX as much more likely culprit as patient had tolerated other cephalosporins in the past, but cannot say with complete certainty that this was not related to cephalexin." As noted in [facility]  "Patient with severe drug reaction including exfoliating skin rash and hypotension after being prescribed both cephalexin and sulfamethoxazole-trimethoprim simultaneously. Favor SMX as much more likely culprit as patient had tolerated other cephalosporins in the past, but cannot say with complete certainty that this was not related to cephalexin." As noted in [facility]     Patient with severe drug reaction inc... (TRUNCATED)        Medication List     STOP taking these medications    amoxicillin 500 MG capsule Commonly known as: AMOXIL   azithromycin 250 MG tablet Commonly known as: ZITHROMAX   doxycycline 100 MG tablet Commonly known as: VIBRA-TABS       TAKE these medications    Accu-Chek Guide test strip Generic drug: glucose blood Use to check fasting blood sugar once daily. diag code  E11.65. Insulin dependent   Acetaminophen Extra Strength 500 MG Tabs Take 1 tablet (500 mg total) by mouth every 8 (eight) hours as needed for mild pain.   Aspirin Low Dose 81 MG tablet Generic drug: aspirin EC Take 1 tablet (81 mg total) by mouth daily.   calcitRIOL 0.25 MCG capsule Commonly known as: ROCALTROL Take 1 capsule  (0.25 mcg total) by mouth every Tuesday, Thursday, and Saturday at 6 PM.   calcium acetate 667 MG tablet Commonly known as: PHOSLO Take 3 tablets (2,001 mg total) by mouth 3 (three) times daily.   carvedilol 12.5 MG tablet Commonly known as: COREG Take 1 tablet by mouth twice a day with meals   HumaLOG KwikPen 100 UNIT/ML KwikPen Generic drug: insulin lispro Inject into the skin.   hydrALAZINE 25 MG tablet Commonly known as: APRESOLINE Take 1 tablet (25 mg total) by mouth 3 (three) times daily.   insulin glargine-yfgn 100 UNIT/ML injection Commonly known as: SEMGLEE Inject 0.07 mLs (7 Units total) into the skin at bedtime.   isosorbide mononitrate 60 MG 24 hr tablet Commonly known as: IMDUR Take 1 tablet (60 mg total) by mouth daily.   melatonin 3 MG Tabs tablet Take 1 tablet (3 mg total) by mouth at bedtime as needed (insomnia).   mupirocin ointment 2 % Commonly known as: BACTROBAN Apply 1 Application topically 3 (three) times daily.   NovoLOG FlexPen 100 UNIT/ML FlexPen Generic drug: insulin aspart 0-9 Units, Subcutaneous, 3 times daily with meals CBG < 70: Implement Hypoglycemia measures CBG 70 - 120: 0 units CBG 121 - 150: 1 unit CBG 151 - 200: 2 units CBG 201 - 250: 3 units CBG 251 - 300: 5 units CBG 301 - 350: 7 units CBG 351 - 400: 9 units CBG > 400: call MD   ondansetron 4 MG disintegrating tablet Commonly known as: ZOFRAN-ODT Take 1 tablet (4 mg total) by mouth every 4 (four) hours as needed for nausea/vomiting.   Protonix 40 MG tablet Generic drug: pantoprazole Take 1 tablet (40 mg total) by mouth daily.   simethicone 80 MG chewable tablet Commonly known as: MYLICON Chew 1 tablet (80 mg total) by mouth 4 (four) times daily for 5 days.   TechLite Pen Needles 32G X 4 MM Misc Generic drug: Insulin Pen Needle Use with insulin pens       Consultations: Nephrology   Procedures/Studies:  IR US Guide Vasc Access Right  Result Date:  04/15/2023 INDICATION: 52 year old male with history of end-stage renal disease and right upper extremity arteriovenous graft presenting with graft occlusion. EXAM: 1. Ultrasound-guided vascular access of the right upper extremity graft x2. 2. Right upper extremity arteriovenous graft fistulagram. 3. Pharmacomechanical thrombectomy of right upper extremity arteriovenous graft. 4. Covered self expanding stent placement. MEDICATIONS: 8 mg tPA, intravenous ANESTHESIA/SEDATION: Moderate (conscious) sedation was employed during this procedure. A total of Versed 2 mg and Fentanyl 100 mcg was administered intravenously. Moderate Sedation Time: 74 minutes. The patient's level of consciousness and vital signs were monitored continuously by radiology nursing throughout the procedure under my direct supervision. FLUOROSCOPY TIME:  Twenty-six mGy COMPLICATIONS: None immediate. PROCEDURE: Informed written consent was obtained from the patient after a thorough discussion of the procedural risks, benefits and alternatives. All questions were addressed. Maximal Sterile Barrier Technique was utilized including caps, mask, sterile gowns, sterile gloves, sterile drape, hand hygiene and skin antiseptic. A timeout was performed prior to the initiation of the procedure. Preprocedure ultrasound evaluation of the right upper extremity  graft demonstrated brachial arterial anastomosis in the distal upper arm with occluded graft throughout originating at the anastomosis. The procedure was planned. First, to obtain antegrade access, a target several cm from the arterial anastomosis was targeted. Subdermal Local anesthesia was provided with 1% lidocaine. A small skin nick was made. In the antegrade direction, a 21 gauge micropuncture needle was introduced into the graft under direct ultrasound visualization. A permanent ultrasound image was captured and stored in the record. Micropuncture sheath introduced followed by placement of a Wholey  wire which was directed to the central veins under fluoroscopic guidance. The sheath was exchanged for a 7 [place], short vascular sheath. Over the wire, an angled 5 French catheter was inserted and directed to the level of the subclavian vein. The wire was removed. Contrast was injected watts retracting the catheter which demonstrated patency of the right subclavian and axillary veins with luminal irregularity in thrombus originating in the mid upper arm at the site of venous graft anastomosis. Next, retrograde access was then obtained. Subdermal Local anesthesia was provided with 1% lidocaine at the planned needle entry site. A small skin nick was made. In the retrograde direction, a 21 gauge micropuncture needle was then introduced into the graft under direct ultrasound visualization. A permanent ultrasound image was captured and stored in the record. A micropuncture sheath was introduced followed by placement of a Wholey wire which was directed into the brachial artery. The micropuncture sheath was exchanged for a short 6 [place] vascular sheath. A Kumpe the catheter was placed over the wire which was removed and limited right upper extremity angiogram was performed which demonstrated occlusion of the indwelling graft and patency of the visualized brachial, radial, and ulnar arteries. From the antegrade access, 8 mg tPA was administered while retracting the 5 French catheter through the occluded graft. Next, from the retrograde access, a Fogarty balloon was placed in 3 sequential arterial anastomotic balloon sweeps were performed. There was restoration of pulsatility within the graft at this point. Next, from the antegrade direction, a 7 [place] cleaner device was inserted and 2 passes were made throughout the length of the occluded graft. Initial fistulagram was then performed from the antegrade access which demonstrated brisk antegrade flow through the looped portion of the graft with sluggish outflow near the  venous anastomosis where there is associated severe stenosis. Therefore, balloon maceration and angioplasty was performed near the venous outflow aspect of the graft in into the native basilic vein. Repeat fistulagram was performed which demonstrated wide patency of the graft with evidence of active extravasation near the venous anastomosis into the biceps musculature. Therefore, wire access was obtained to a stenotic collateral into the basilic vein and under fluoroscopic guidance, a 7 mm x 7.5 cm Viatorr stent was placed. Completion venogram demonstrated patency with brisk antegrade flow through the brachial basilic graft with resolution of previously visualized muscular extravasation. The central veins were widely patent. Z stitches were placed at each access site in the sheath were removed successfully achieving near immediate hemostasis. A sterile bandage was applied at each access site. The patient tolerated the procedure well was transferred back to the floor in good condition. IMPRESSION: 1. Occluded brachial basilic arteriovenous loop graft in the right upper extremity. 2. Technically successful pharmacomechanical thrombectomy of right upper extremity arteriovenous graft. 3. Due to severe stenosis with vascular extravasation after angioplasty, the covered stent was placed at the region of the venous anastomosis. ACCESS: This access remains amenable to future percutaneous interventions as clinically indicated.  Marliss Coots, MD [facility] Radiology Electronically Signed   By: Marliss Coots M.D.   On: 04/15/2023 21:51   IR THROMBECTOMY AV FISTULA W/THROMBOLYSIS/PTA/STENT INC SHUNT/IMG RT  Result Date: 04/15/2023 INDICATION: 52 year old male with history of end-stage renal disease and right upper extremity arteriovenous graft presenting with graft occlusion. EXAM: 1. Ultrasound-guided vascular access of the right upper extremity graft x2. 2. Right upper  extremity arteriovenous graft fistulagram. 3. Pharmacomechanical thrombectomy of right upper extremity arteriovenous graft. 4. Covered self expanding stent placement. MEDICATIONS: 8 mg tPA, intravenous ANESTHESIA/SEDATION: Moderate (conscious) sedation was employed during this procedure. A total of Versed 2 mg and Fentanyl 100 mcg was administered intravenously. Moderate Sedation Time: 74 minutes. The patient's level of consciousness and vital signs were monitored continuously by radiology nursing throughout the procedure under my direct supervision. FLUOROSCOPY TIME:  Twenty-six mGy COMPLICATIONS: None immediate. PROCEDURE: Informed written consent was obtained from the patient after a thorough discussion of the procedural risks, benefits and alternatives. All questions were addressed. Maximal Sterile Barrier Technique was utilized including caps, mask, sterile gowns, sterile gloves, sterile drape, hand hygiene and skin antiseptic. A timeout was performed prior to the initiation of the procedure. Preprocedure ultrasound evaluation of the right upper extremity graft demonstrated brachial arterial anastomosis in the distal upper arm with occluded graft throughout originating at the anastomosis. The procedure was planned. First, to obtain antegrade access, a target several cm from the arterial anastomosis was targeted. Subdermal Local anesthesia was provided with 1% lidocaine. A small skin nick was made. In the antegrade direction, a 21 gauge micropuncture needle was introduced into the graft under direct ultrasound visualization. A permanent ultrasound image was captured and stored in the record. Micropuncture sheath introduced followed by placement of a Wholey wire which was directed to the central veins under fluoroscopic guidance. The sheath was exchanged for a 7 [place], short vascular sheath. Over the wire, an angled 5 French catheter was inserted and directed to the level of the subclavian vein. The wire was  removed. Contrast was injected watts retracting the catheter which demonstrated patency of the right subclavian and axillary veins with luminal irregularity in thrombus originating in the mid upper arm at the site of venous graft anastomosis. Next, retrograde access was then obtained. Subdermal Local anesthesia was provided with 1% lidocaine at the planned needle entry site. A small skin nick was made. In the retrograde direction, a 21 gauge micropuncture needle was then introduced into the graft under direct ultrasound visualization. A permanent ultrasound image was captured and stored in the record. A micropuncture sheath was introduced followed by placement of a Wholey wire which was directed into the brachial artery. The micropuncture sheath was exchanged for a short 6 [place] vascular sheath. A Kumpe the catheter was placed over the wire which was removed and limited right upper extremity angiogram was performed which demonstrated occlusion of the indwelling graft and patency of the visualized brachial, radial, and ulnar arteries. From the antegrade access, 8 mg tPA was administered while retracting the 5 French catheter through the occluded graft. Next, from the retrograde access, a Fogarty balloon was placed in 3 sequential arterial anastomotic balloon sweeps were performed. There was restoration of pulsatility within the graft at this point. Next, from the antegrade direction, a 7 [place] cleaner device was inserted and 2 passes were made throughout the length of the occluded graft. Initial fistulagram was then performed from the antegrade access which demonstrated brisk antegrade flow through the looped  portion of the graft with sluggish outflow near the venous anastomosis where there is associated severe stenosis. Therefore, balloon maceration and angioplasty was performed near the venous outflow aspect of the graft in into the native basilic vein. Repeat fistulagram was performed which demonstrated wide  patency of the graft with evidence of active extravasation near the venous anastomosis into the biceps musculature. Therefore, wire access was obtained to a stenotic collateral into the basilic vein and under fluoroscopic guidance, a 7 mm x 7.5 cm Viatorr stent was placed. Completion venogram demonstrated patency with brisk antegrade flow through the brachial basilic graft with resolution of previously visualized muscular extravasation. The central veins were widely patent. Z stitches were placed at each access site in the sheath were removed successfully achieving near immediate hemostasis. A sterile bandage was applied at each access site. The patient tolerated the procedure well was transferred back to the floor in good condition. IMPRESSION: 1. Occluded brachial basilic arteriovenous loop graft in the right upper extremity. 2. Technically successful pharmacomechanical thrombectomy of right upper extremity arteriovenous graft. 3. Due to severe stenosis with vascular extravasation after angioplasty, the covered stent was placed at the region of the venous anastomosis. ACCESS: This access remains amenable to future percutaneous interventions as clinically indicated. Marliss Coots, MD [facility] Radiology Electronically Signed   By: Marliss Coots M.D.   On: 04/15/2023 21:51   VAS US DUPLEX DIALYSIS ACCESS (AVF, AVG)  Result Date: 04/11/2023 DIALYSIS ACCESS Patient Name:  [name]  Date of Exam:   04/11/2023 Medical Rec #: [id number]        Accession #:    [id number] Date of Birth: 02/02/1971       Patient Gender: M Patient Age:   20 years Exam Location:  [facility] Procedure:      VAS US DUPLEX DIALYSIS ACCESS (AVF, AVG) Referring Phys: Molly Maduro PATERSON --------------------------------------------------------------------------------  Reason for Exam: No palpable thrill for AVF/AVG. Access Site: Right Upper Extremity. Access Type: Forearm loop AVG.  History: 02/20/2021 RT AVG creation. Comparison Study: Previous exam on 10/24/2022 showed patent graft Performing Technologist: Ernestene Mention RVT, RDMS  Examination Guidelines: A complete evaluation includes B-mode imaging, spectral Doppler, color Doppler, and power Doppler as needed of all accessible portions of each vessel. Unilateral testing is considered an integral part of a complete examination. Limited examinations for reoccurring indications may be performed as noted.  Findings:   +--------------------+----------+-----------------+--------+ AVG                 PSV (cm/s)Flow Vol (mL/min)Describe +--------------------+----------+-----------------+--------+ Native artery inflow    75                              +--------------------+----------+-----------------+--------+ Arterial anastomosis                           occluded +--------------------+----------+-----------------+--------+ Prox graft                                     occluded +--------------------+----------+-----------------+--------+ Mid graft                                      occluded +--------------------+----------+-----------------+--------+ Distal graft  occluded +--------------------+----------+-----------------+--------+  Summary: RT AVG appears occluded.  *See table(s) above for measurements and observations.  Suggest Peripheral Vascular Consult. Diagnosing physician: Gerarda Fraction Electronically signed by Gerarda Fraction on 04/11/2023 at 3:42:01 PM.    --------------------------------------------------------------------------------   Final    DG Chest 2 View  Result Date: 04/11/2023 CLINICAL DATA:  Chest pain. EXAM: CHEST - 2 VIEW COMPARISON:  April 01, 2023. FINDINGS: Stable cardiomegaly. Mild central pulmonary vascular congestion is noted. Stable bilateral pulmonary nodules are noted in the left perihilar and right basilar regions. Old distal left clavicular fracture  is noted. IMPRESSION: Stable cardiomegaly with central pulmonary vascular congestion. Stable bilateral pulmonary nodules are noted. These are concerning for possible neoplasm, and CT scan of the chest is recommended for further evaluation. Electronically Signed   By: Lupita Raider M.D.   On: 04/11/2023 15:08   DG Chest 2 View  Result Date: 04/01/2023 CLINICAL DATA:  Cough.  Leukocytosis. EXAM: CHEST - 2 VIEW COMPARISON:  03/30/2023. FINDINGS: Redemonstration of diffuse pulmonary vascular congestion without significant interval change. There are also persistent nodular opacities overlying the left mid lower lung zones, which are new since the prior CT scan from 12/20/2022 and concerning for atelectasis and/or pneumonia. Bilateral lung fields are otherwise clear of dense consolidation or lung collapse. Note is again made of elevated right hemidiaphragm. Bilateral costophrenic angles are clear. Stable markedly enlarged cardio-mediastinal silhouette. No acute osseous abnormalities. The soft tissues are within normal limits. IMPRESSION: *Marked cardiomegaly with congestive heart failure/pulmonary edema. *There are also persistent nodular opacities overlying the left mid lower lung zones, which are new since the prior CT scan from 12/20/2022 and concerning for atelectasis and/or pneumonia. Follow-up to clearing is recommended. Electronically Signed   By: Jules Schick M.D.   On: 04/01/2023 07:51   DG Chest Port 1 View  Result Date: 03/30/2023 CLINICAL DATA:  Vomiting and shortness of breath. EXAM: PORTABLE CHEST 1 VIEW COMPARISON:  March 20, 2023 FINDINGS: There is stable moderate to marked severity cardiac silhouette enlargement. Mild prominence of the central pulmonary vasculature is seen. Mild airspace disease is noted within the mid left lung. No pleural effusion or pneumothorax is identified. The visualized skeletal structures are unremarkable. IMPRESSION: 1. Stable cardiomegaly with mild central  pulmonary vascular congestion. 2. Mild mid left lung atelectasis and/or infiltrate. Electronically Signed   By: Aram Candela M.D.   On: 03/30/2023 19:48   DG Chest Port 1 View  Result Date: 03/20/2023 CLINICAL DATA:  Shortness of breath. EXAM: PORTABLE CHEST 1 VIEW COMPARISON:  March 19, 2023 FINDINGS: The cardiac silhouette is markedly enlarged and unchanged in size. There is moderate severity, predominately central prominence of the pulmonary vasculature. There is no evidence of focal consolidation, pleural effusion or pneumothorax. The visualized skeletal structures are unremarkable. IMPRESSION: Marked cardiomegaly with moderate severity pulmonary vascular congestion. Electronically Signed   By: Aram Candela M.D.   On: 03/20/2023 19:49   DG Chest Portable 1 View  Result Date: 03/19/2023 CLINICAL DATA:  Shortness of breath EXAM: PORTABLE CHEST 1 VIEW COMPARISON:  Four days ago FINDINGS: Cardiopericardial enlargement and vascular pedicle widening with congestion of vessels. Pleural fluid was seen on as CT from August 2024. There is no edema, consolidation, effusion, or pneumothorax. Atelectasis is likely at the lower lobes. No visible effusion or pneumothorax. IMPRESSION: Chronic cardiomegaly and vascular congestion. No acute finding when compared to prior Electronically Signed   By: Tiburcio Pea M.D.   On: 03/19/2023 05:45  Subjective: - no chest pain, shortness of breath, no abdominal pain, nausea or vomiting.   Discharge Exam: BP 124/64   Pulse 62   Temp 98.5 F (36.9 C)   Resp 12   Wt 82.6 kg   SpO2 97%   BMI 28.52 kg/m   General: Pt is alert, awake, not in acute distress Cardiovascular: RRR, S1/S2 +, no rubs, no gallops Respiratory: CTA bilaterally, no wheezing, no rhonchi Abdominal: Soft, NT, ND, bowel sounds + Extremities: no edema, no cyanosis    The results of significant diagnostics from this hospitalization (including imaging, microbiology, ancillary  and laboratory) are listed below for reference.     Microbiology: No results found for this or any previous visit (from the past 240 hour(s)).   Labs: Basic Metabolic Panel: Recent Labs  Lab 04/11/23 1329 04/13/23 1404 04/14/23 1004 04/16/23 0531  NA 138 136 134* 134*  K 4.3 4.9 4.5 5.6*  CL 97* 97* 96* 99  CO2 18* 15* 18* 14*  GLUCOSE 113* 179* 160* 107*  BUN 108* 117* 120* 132*  CREATININE 18.37* 18.99* 19.44* 20.69*  CALCIUM 7.8* 7.1* 6.9* 7.0*   Liver Function Tests: No results for input(s): "AST", "ALT", "ALKPHOS", "BILITOT", "PROT", "ALBUMIN" in the last 168 hours. CBC: Recent Labs  Lab 04/11/23 1329 04/16/23 0531  WBC 6.9 7.2  HGB 10.9* 7.6*  HCT 35.3* 23.7*  MCV 82.9 79.5*  PLT 454* 324   CBG: Recent Labs  Lab 04/11/23 1314 04/13/23 1212 04/14/23 1820 04/14/23 2042 04/15/23 0930  GLUCAP 136* 118* 141* 78 74   Hgb A1c No results for input(s): "HGBA1C" in the last 72 hours. Lipid Profile No results for input(s): "CHOL", "HDL", "LDLCALC", "TRIG", "CHOLHDL", "LDLDIRECT" in the last 72 hours. Thyroid function studies No results for input(s): "TSH", "T4TOTAL", "T3FREE", "THYROIDAB" in the last 72 hours.  Invalid input(s): "FREET3" Urinalysis    Component Value Date/Time   COLORURINE YELLOW 02/19/2021 0045   APPEARANCEUR HAZY (A) 02/19/2021 0045   APPEARANCEUR Clear 05/09/2020 1509   LABSPEC 1.013 02/19/2021 0045   PHURINE 5.0 02/19/2021 0045   GLUCOSEU >=500 (A) 02/19/2021 0045   HGBUR MODERATE (A) 02/19/2021 0045   BILIRUBINUR NEGATIVE 02/19/2021 0045   BILIRUBINUR Negative 05/09/2020 1509   KETONESUR NEGATIVE 02/19/2021 0045   PROTEINUR >=300 (A) 02/19/2021 0045   UROBILINOGEN 0.2 02/04/2020 1612   UROBILINOGEN 0.2 02/16/2014 0045   NITRITE NEGATIVE 02/19/2021 0045   LEUKOCYTESUR NEGATIVE 02/19/2021 0045    FURTHER DISCHARGE INSTRUCTIONS:   Get Medicines reviewed and adjusted: Please take all your medications with you for your next  visit with your Primary MD   Laboratory/radiological data: Please request your Primary MD to go over all hospital tests and procedure/radiological results at the follow up, please ask your Primary MD to get all Hospital records sent to his/her office.   In some cases, they will be blood work, cultures and biopsy results pending at the time of your discharge. Please request that your primary care M.D. goes through all the records of your hospital data and follows up on these results.   Also Note the following: If you experience worsening of your admission symptoms, develop shortness of breath, life threatening emergency, suicidal or homicidal thoughts you must seek medical attention immediately by calling 911 or calling your MD immediately  if symptoms less severe.   You must read complete instructions/literature along with all the possible adverse reactions/side effects for all the Medicines you take and that have been prescribed to you.  Take any new Medicines after you have completely understood and accpet all the possible adverse reactions/side effects.    Do not drive when taking Pain medications or sleeping medications (Benzodaizepines)   Do not take more than prescribed Pain, Sleep and Anxiety Medications. It is not advisable to combine anxiety,sleep and pain medications without talking with your primary care practitioner   Special Instructions: If you have smoked or chewed Tobacco  in the last 2 yrs please stop smoking, stop any regular Alcohol  and or any Recreational drug use.   Wear Seat belts while driving.   Please note: You were cared for by a hospitalist during your hospital stay. Once you are discharged, your primary care physician will handle any further medical issues. Please note that NO REFILLS for any discharge medications will be authorized once you are discharged, as it is imperative that you return to your primary care physician (or establish a relationship with a primary  care physician if you do not have one) for your post hospital discharge needs so that they can reassess your need for medications and monitor your lab values.  Time coordinating discharge: 40 minutes  SIGNED:  Pamella Pert, MD, PhD 04/17/2023, 8:01 AM

## 2023-04-17 NOTE — ED Triage Notes (Signed)
Pt c.o arm pain, states he just had his dialysis fistula declotted.

## 2023-04-17 NOTE — Progress Notes (Signed)
Pt refuse Dialysis treatment at 1845 hrs and 2240 hrs on 04/16/2023. Pt refuse dialysis on 04/17/2023 at 0400 hrs. Nephrologist informed.

## 2023-04-17 NOTE — TOC Transition Note (Signed)
Transition of Care [facility]) - Progression Note    Patient Details  Name: [name] MRN: [id number] Date of Birth: [birth year]  Transition of Care [facility]) CM/SW Contact  Inis Sizer, LCSW Phone Number: 04/17/2023, 8:55 AM  Clinical Narrative:    CSW spoke with RN regarding patient's discharge to the shelter - bus pass given to assist with transportation.        Expected Discharge Plan and Services         Expected Discharge Date: 04/17/23                                     Social Determinants of Health (SDOH) Interventions SDOH Screenings   Food Insecurity: Patient Declined (04/12/2023)  Recent Concern: Food Insecurity - Food Insecurity Present (03/16/2023)  Housing: Patient Declined (04/12/2023)  Recent Concern: Housing - Medium Risk (03/16/2023)  Transportation Needs: Patient Declined (04/12/2023)  Utilities: Patient Declined (04/12/2023)  Depression (PHQ2-9): Low Risk  (10/13/2019)  Financial Resource Strain: High Risk (09/24/2022)   Received from [facility], [facility] Subsidiaries and Affiliates  Stress: Stress Concern Present (09/24/2022)   Received from [facility] Subsidiaries and Affiliates, [facility] and Its Subsidiaries and Affiliates  Tobacco Use: Low Risk  (03/30/2023)    Readmission Risk Interventions    01/31/2023    2:35 PM 10/22/2022    2:17 PM 11/07/2021    4:21 PM  Readmission Risk Prevention Plan  Transportation Screening Complete Complete Complete  Medication Review (RN Care Manager) Complete Complete Complete  PCP or Specialist appointment within 3-5 days of discharge Complete Complete Complete  HRI or Home Care Consult Complete Complete Complete  SW Recovery Care/Counseling Consult Complete Complete Complete  Palliative Care Screening Not Applicable Not Applicable Not Applicable  Skilled Nursing Facility Not Applicable Complete Not  Applicable

## 2023-04-18 ENCOUNTER — Telehealth: Payer: Self-pay

## 2023-04-18 NOTE — Transitions of Care (Post Inpatient/ED Visit) (Signed)
   04/18/2023  Name: JHAN WALBECK MRN: [id number] DOB: 1971/01/12  Today's TOC FU Call Status: Today's TOC FU Call Status:: Unsuccessful Call (1st Attempt) Unsuccessful Call (1st Attempt) Date: 04/18/23  Attempted to reach the patient regarding the most recent Inpatient/ED visit.  Follow Up Plan: Additional outreach attempts will be made to reach the patient to complete the Transitions of Care (Post Inpatient/ED visit) call.   Alyse Low, RN, BA, [facility], CRRN [facility] Coordinator, Transition of Care Ph # [phone]

## 2023-04-18 NOTE — Discharge Planning (Signed)
[facility] Patient Discharge Orders- [facility] CLINIC: [facility]  Patient's name: CARLISS KATAOKA Admit/DC Dates: 04/11/2023 - 04/17/2023  Discharge Diagnoses: Clotted AV graft  s/p IR thrombectomy  Medical noncompliance - psych, palliative care consulted    Recent Labs  Lab 04/16/23 0531  HGB 7.6*  K 5.6*  CALCIUM 7.0*    Aranesp: Given:   No. Pt refused   Date of last dose/amount: --   PRBC's Given: -- Date/# of units: -- ESA dose for discharge: Mircera 200 mcg IV q 2 weeks    Outpatient Dialysis Orders:  -Heparin: no change -EDW no change  -[place]: no change  Access intervention/Change:  AVG declot IR 04/15/23   IV Antibiotics: None   OTHER/APPTS/LABS:  Refused to participate in psych or palliative care evaluation   CODE STATUS:  Full    Completed by: Tomasa Blase PA-C   D/C Meds to be reconciled by nurse after every discharge.    Reviewed by: MD:______ RN_______

## 2023-04-21 ENCOUNTER — Telehealth: Payer: Self-pay

## 2023-04-21 NOTE — Progress Notes (Signed)
Late Note Entry- Apr 21, 2023  Pt was d/c on Thursday. Contacted [facility] this morning to advise clinic of pt's d/c date.   Olivia Canter Renal Navigator [phone]

## 2023-04-21 NOTE — Transitions of Care (Post Inpatient/ED Visit) (Signed)
   04/21/2023  Name: [name] MRN: [id number] DOB: 28-Jan-1971  Today's TOC FU Call Status: Today's TOC FU Call Status:: Unsuccessful Call (2nd Attempt) Unsuccessful Call (2nd Attempt) Date: 04/21/23  Attempted to reach the patient regarding the most recent Inpatient/ED visit.  Follow Up Plan: Additional outreach attempts will be made to reach the patient to complete the Transitions of Care (Post Inpatient/ED visit) call.   Alyse Low, RN, BA, [facility], CRRN [facility] Coordinator, Transition of Care Ph # [phone]

## 2023-04-22 ENCOUNTER — Telehealth: Payer: Self-pay

## 2023-04-22 NOTE — Transitions of Care (Post Inpatient/ED Visit) (Signed)
   04/22/2023  Name: [name] MRN: [id number] DOB: 1971-05-01  Today's TOC FU Call Status: Today's TOC FU Call Status:: Unsuccessful Call (3rd Attempt) Unsuccessful Call (3rd Attempt) Date: 04/22/23  Attempted to reach the patient regarding the most recent Inpatient/ED visit.  Follow Up Plan: No further outreach attempts will be made at this time. We have been unable to contact the patient.  Alyse Low, RN, BA, [facility], CRRN [facility] Coordinator, Transition of Care Ph # [phone]

## 2023-05-02 ENCOUNTER — Emergency Department (HOSPITAL_COMMUNITY)
Admission: EM | Admit: 2023-05-02 | Discharge: 2023-05-03 | Disposition: A | Discharge status: Home / Self Care | Payer: 59 | Attending: Emergency Medicine | Admitting: Emergency Medicine

## 2023-05-02 ENCOUNTER — Emergency Department (HOSPITAL_COMMUNITY): Payer: 59

## 2023-05-02 DIAGNOSIS — N186 End stage renal disease: Secondary | ICD-10-CM | POA: Diagnosis not present

## 2023-05-02 DIAGNOSIS — Z992 Dependence on renal dialysis: Secondary | ICD-10-CM | POA: Diagnosis not present

## 2023-05-02 DIAGNOSIS — R140 Abdominal distension (gaseous): Secondary | ICD-10-CM | POA: Diagnosis not present

## 2023-05-02 DIAGNOSIS — R531 Weakness: Secondary | ICD-10-CM | POA: Insufficient documentation

## 2023-05-02 DIAGNOSIS — I517 Cardiomegaly: Secondary | ICD-10-CM | POA: Diagnosis not present

## 2023-05-02 DIAGNOSIS — R918 Other nonspecific abnormal finding of lung field: Secondary | ICD-10-CM | POA: Diagnosis not present

## 2023-05-02 DIAGNOSIS — R0989 Other specified symptoms and signs involving the circulatory and respiratory systems: Secondary | ICD-10-CM | POA: Diagnosis not present

## 2023-05-02 DIAGNOSIS — I120 Hypertensive chronic kidney disease with stage 5 chronic kidney disease or end stage renal disease: Secondary | ICD-10-CM | POA: Diagnosis not present

## 2023-05-02 LAB — CBC WITH DIFFERENTIAL/PLATELET
Abs Immature Granulocytes: 0.08 10*3/uL — ABNORMAL HIGH (ref 0.00–0.07)
Basophils Absolute: 0 10*3/uL (ref 0.0–0.1)
Basophils Relative: 0 %
Eosinophils Absolute: 0.4 10*3/uL (ref 0.0–0.5)
Eosinophils Relative: 3 %
HCT: 24.6 % — ABNORMAL LOW (ref 39.0–52.0)
Hemoglobin: 8.2 g/dL — ABNORMAL LOW (ref 13.0–17.0)
Immature Granulocytes: 1 %
Lymphocytes Relative: 8 %
Lymphs Abs: 0.9 10*3/uL (ref 0.7–4.0)
MCH: 27.2 pg (ref 26.0–34.0)
MCHC: 33.3 g/dL (ref 30.0–36.0)
MCV: 81.5 fL (ref 80.0–100.0)
Monocytes Absolute: 0.9 10*3/uL (ref 0.1–1.0)
Monocytes Relative: 9 %
Neutro Abs: 8.6 10*3/uL — ABNORMAL HIGH (ref 1.7–7.7)
Neutrophils Relative %: 79 %
Platelets: 346 10*3/uL (ref 150–400)
RBC: 3.02 MIL/uL — ABNORMAL LOW (ref 4.22–5.81)
RDW: 19.9 % — ABNORMAL HIGH (ref 11.5–15.5)
WBC: 10.9 10*3/uL — ABNORMAL HIGH (ref 4.0–10.5)
nRBC: 0 % (ref 0.0–0.2)

## 2023-05-02 LAB — COMPREHENSIVE METABOLIC PANEL
ALT: 13 U/L (ref 0–44)
AST: 25 U/L (ref 15–41)
Albumin: 3 g/dL — ABNORMAL LOW (ref 3.5–5.0)
Alkaline Phosphatase: 128 U/L — ABNORMAL HIGH (ref 38–126)
Anion gap: 29 — ABNORMAL HIGH (ref 5–15)
BUN: 161 mg/dL — ABNORMAL HIGH (ref 6–20)
CO2: 8 mmol/L — ABNORMAL LOW (ref 22–32)
Calcium: 6 mg/dL — CL (ref 8.9–10.3)
Chloride: 102 mmol/L (ref 98–111)
Creatinine, Ser: 24.85 mg/dL — ABNORMAL HIGH (ref 0.61–1.24)
GFR, Estimated: 2 mL/min — ABNORMAL LOW (ref >60)
Glucose, Bld: 103 mg/dL — ABNORMAL HIGH (ref 70–99)
Potassium: 4.3 mmol/L (ref 3.5–5.1)
Sodium: 139 mmol/L (ref 135–145)
Total Bilirubin: 0.8 mg/dL (ref <1.2)
Total Protein: 8.2 g/dL — ABNORMAL HIGH (ref 6.5–8.1)

## 2023-05-02 NOTE — ED Notes (Signed)
Date and time results received: 05/02/23 2002   Test: Ca+ Critical Value: 6  Name of Provider Notified: Vanetta Mulders MD

## 2023-05-02 NOTE — ED Notes (Signed)
Patient refuses to answer triage questions. Also refuses to allow staff to obtain temperature.

## 2023-05-02 NOTE — ED Notes (Signed)
Pt refused to put gown on and refused to put back on any monitoring devices. Pt educated on importance of having EKG monitor on. Pt still declined.

## 2023-05-02 NOTE — ED Notes (Signed)
Pt refusing to reach anyone for a ride home following discharge. RN made multiple attempts to reach family contacts listed in Pts chart, yet, unable to successfully reach anyone at this time.  This RN also notified APS regarding Pts inability to care for himself at home and non-compliance with appointments and treatment.

## 2023-05-02 NOTE — ED Provider Notes (Signed)
[place] EMERGENCY DEPARTMENT AT [facility] Provider Note   CSN: [id number] Arrival date & time: 05/02/23  1746     History  Chief Complaint  Patient presents with   Weakness    [name] is a 52 y.o. male.  Patient well-known to most of his.  Patient comes in tonight stating that he has not had dialysis since November 22.  He was admitted that time for the dialysis.  Patient without any shortness of breath oxygen saturations room air is 100%.  Patient was a little bit resistant to have x-ray and labs.  Saying he wanted to get a shower.  Patient's AV fistula is in the right forearm area.  Review shows that patient is very noncompliant with getting dialysis.  Does not currently have a permanent dialysis center because he is noncompliant.       Home Medications Prior to Admission medications   Medication Sig Start Date End Date Taking? Authorizing Provider  acetaminophen (TYLENOL) 500 MG tablet Take 1 tablet (500 mg total) by mouth every 8 (eight) hours as needed for mild pain. 05/28/22   Leroy Sea, MD  ASPIRIN LOW DOSE 81 MG tablet Take 1 tablet (81 mg total) by mouth daily. 05/28/22   Leroy Sea, MD  calcitRIOL (ROCALTROL) 0.25 MCG capsule Take 1 capsule (0.25 mcg total) by mouth every Tuesday, Thursday, and Saturday at 6 PM. 10/26/22   Ghimire, Werner Lean, MD  calcium acetate (PHOSLO) 667 MG tablet Take 3 tablets (2,001 mg total) by mouth 3 (three) times daily. 03/06/23     carvedilol (COREG) 12.5 MG tablet Take 1 tablet by mouth twice a day with meals 03/06/23     glucose blood test strip Use to check fasting blood sugar once daily. 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 10/26/22   [provider]  hydrALAZINE (APRESOLINE) 25 MG tablet Take 1 tablet (25 mg total) by mouth 3 (three) times daily. 02/15/23   Zannie Cove, MD  insulin aspart (NOVOLOG  FLEXPEN) 100 UNIT/ML FlexPen 0-9 Units, Subcutaneous, 3 times daily with meals CBG < 70: Implement Hypoglycemia measures CBG 70 - 120: 0 units CBG 121 - 150: 1 unit CBG 151 - 200: 2 units CBG 201 - 250: 3 units CBG 251 - 300: 5 units CBG 301 - 350: 7 units CBG 351 - 400: 9 units CBG > 400: call MD 10/26/22   Maretta Bees, MD  insulin glargine-yfgn (SEMGLEE) 100 UNIT/ML injection Inject 0.07 mLs (7 Units total) into the skin at bedtime. 10/26/22   Ghimire, Werner Lean, MD  Insulin Pen Needle 32G X 4 MM MISC Use with insulin pens 05/28/22   Leroy Sea, MD  isosorbide mononitrate (IMDUR) 60 MG 24 hr tablet Take 1 tablet (60 mg total) by mouth daily. 10/26/22   Ghimire, Werner Lean, MD  melatonin 3 MG TABS tablet Take 1 tablet (3 mg total) by mouth at bedtime as needed (insomnia). 10/26/22   Ghimire, Werner Lean, MD  mupirocin ointment (BACTROBAN) 2 % Apply 1 Application topically 3 (three) times daily. 01/07/23   Mardella Layman, MD  ondansetron (ZOFRAN-ODT) 4 MG disintegrating tablet Take 1 tablet (4 mg total) by mouth every 4 (four) hours as needed for nausea/vomiting. 03/05/23   Melene Plan, DO  PROTONIX 40 MG tablet Take 1 tablet (40 mg total) by mouth daily. 05/25/22   Leroy Sea,  MD  simethicone (MYLICON) 80 MG chewable tablet Chew 1 tablet (80 mg total) by mouth 4 (four) times daily for 5 days. 11/22/22 04/11/23  Rodolph Bong, MD  gabapentin (NEURONTIN) 300 MG capsule Take 1 capsule (300 mg total) by mouth 3 (three) times daily. Patient not taking: Reported on 07/27/2019 07/29/18 08/30/19  Kallie Locks, FNP  sildenafil (VIAGRA) 25 MG tablet Take 1 tablet (25 mg total) by mouth as needed for erectile dysfunction. for erectile dysfunction. Patient not taking: Reported on 08/11/2020 10/13/19 11/27/20  Kallie Locks, FNP      Allergies    Sulfa antibiotics, Other, Bactrim [sulfamethoxazole-trimethoprim], and Cephalexin    Review of Systems   Review of Systems  Constitutional:  Negative for  chills and fever.  HENT:  Negative for ear pain and sore throat.   Eyes:  Negative for pain and visual disturbance.  Respiratory:  Negative for cough and shortness of breath.   Cardiovascular:  Positive for leg swelling. Negative for chest pain and palpitations.  Gastrointestinal:  Negative for abdominal pain and vomiting.  Genitourinary:  Negative for dysuria and hematuria.  Musculoskeletal:  Negative for arthralgias and back pain.  Skin:  Negative for color change and rash.  Neurological:  Negative for seizures and syncope.  All other systems reviewed and are negative.   Physical Exam Updated Vital Signs BP (!) 146/67   Pulse 73   Resp 19   Ht 1.702 m (5\' 7" )   Wt 83.9 kg   SpO2 97%   BMI 28.98 kg/m  Physical Exam Vitals and nursing note reviewed.  Constitutional:      General: He is not in acute distress.    Appearance: Normal appearance. He is well-developed. He is ill-appearing.  HENT:     Head: Normocephalic and atraumatic.  Eyes:     Conjunctiva/sclera: Conjunctivae normal.  Cardiovascular:     Rate and Rhythm: Normal rate and regular rhythm.     Heart sounds: No murmur heard. Pulmonary:     Effort: Pulmonary effort is normal. No respiratory distress.     Breath sounds: Normal breath sounds. No wheezing or rhonchi.  Abdominal:     General: There is distension.     Palpations: Abdomen is soft.     Tenderness: There is no abdominal tenderness.  Musculoskeletal:        General: No swelling.     Cervical back: Neck supple.     Comments: AV fistula right forearm with thrill  Skin:    General: Skin is warm and dry.     Capillary Refill: Capillary refill takes less than 2 seconds.  Neurological:     General: No focal deficit present.     Mental Status: He is alert and oriented to person, place, and time.  Psychiatric:        Mood and Affect: Mood normal.     ED Results / Procedures / Treatments   Labs (all labs ordered are listed, but only abnormal results  are displayed) Labs Reviewed  COMPREHENSIVE METABOLIC PANEL - Abnormal; Notable for the following components:      Result Value   CO2 8 (*)    Glucose, Bld 103 (*)    BUN 161 (*)    Creatinine, Ser 24.85 (*)    Calcium 6.0 (*)    Total Protein 8.2 (*)    Albumin 3.0 (*)    Alkaline Phosphatase 128 (*)    GFR, Estimated 2 (*)    Anion gap  29 (*)    All other components within normal limits  CBC WITH DIFFERENTIAL/PLATELET - Abnormal; Notable for the following components:   WBC 10.9 (*)    RBC 3.02 (*)    Hemoglobin 8.2 (*)    HCT 24.6 (*)    RDW 19.9 (*)    Neutro Abs 8.6 (*)    Abs Immature Granulocytes 0.08 (*)    All other components within normal limits    EKG EKG Interpretation Date/Time:  Friday May 02 2023 17:54:09 EST Ventricular Rate:  75 PR Interval:  171 QRS Duration:  173 QT Interval:  524 QTC Calculation: 586 R Axis:   139  Text Interpretation: Sinus rhythm Probable left atrial enlargement Nonspecific intraventricular conduction delay Consider anterior infarct No significant change since last tracing Confirmed by Vanetta Mulders [phone]) on 05/02/2023 6:14:10 PM  Radiology No results found.  Procedures Procedures    Medications Ordered in ED Medications - No data to display  ED Course/ Medical Decision Making/ A&P                                 Medical Decision Making Amount and/or Complexity of Data Reviewed Labs: ordered. Radiology: ordered.   Patient's labs here better than expected.  Potassium was 4.3 calcium is 6.  Discussed with Dr. Melanee Spry on for nephrology.  Did not necessarily want [place] to do anything about the calcium.  Patient's creatinine 24.85 not surprising.  CBC white count 10 hemoglobin 8.2 platelets are 346.  Chest x-ray still in process.  To me it looks as if there could be some mild pulmonary edema or CHF.  But not florid.  Patient without any respiratory problems.  Oxygen sats are good.  EKG without any significant changes  compared to previous.  Discussed with Dr. Melanee Spry.  Recommend that patient get dialyzed at [facility] on Monday or Tuesday.  Does not feel he needs transfer for acute dialysis tonight.  He wants him to arrive at about 6:45 in the morning and they will dialyze him.  He has to arrive at the [facility] ED.  For dialysis.   Final Clinical Impression(s) / ED Diagnoses Final diagnoses:  ESRD on dialysis [facility])    Rx / DC Orders ED Discharge Orders     None         Vanetta Mulders, MD 05/02/23 2128

## 2023-05-02 NOTE — ED Notes (Signed)
Pt has defecated on himself and is  refusing temp and peri care.

## 2023-05-02 NOTE — Discharge Instructions (Signed)
Cussed with on-call nephrology Dr. Melanee Spry.  He recommends that she go to [facility] ED at 645 first part of next week recommend Monday they will get you in for dialysis.  No acute indications for dialysis here tonight.

## 2023-05-02 NOTE — ED Notes (Signed)
Family friend  [place]   Hogg[place]  [phone]

## 2023-05-02 NOTE — ED Notes (Signed)
Attempted IV x 2 with no success. 

## 2023-05-02 NOTE — ED Triage Notes (Signed)
Pt brought in by [facility] for c/o generalized weakness due to not having hemodialysis x 3 weeks. EMS reports O2 sats were in the 70's when they arrived, pt was not wearing oxygen and normally wears 2L.  EMS: 169/76, 75, 100% on 2L, CBG 109

## 2023-05-02 NOTE — ED Notes (Signed)
RN was able to reach [place] [phone] , who reports she is the only one who lives in [place] and she will be working on finding a ride home for the Pt.

## 2023-05-03 NOTE — ED Notes (Signed)
Pt finally decided to get out of soiled paper scrubs and get into clean paper scrubs.  Pt wheeled back out to lobby. Patient refused to help.

## 2023-05-06 NOTE — Progress Notes (Unsigned)
New Patient Office Visit   Subjective   Patient ID: [name], male    DOB: 06/28/1970  Age: 52 y.o. MRN: [id number]  CC: No chief complaint on file.   HPI [name] 52 year old male, presents to establish care. He  has a past medical history of Anxiety (02/2019), Anxiety (10/2019), Diabetes mellitus type II, uncontrolled (07/17/2006), Elevated alkaline phosphatase level (01/2019), ESRD on hemodialysis [facility]), ESSENTIAL HYPERTENSION (07/17/2006), Homelessness (10/13/2021), HYPERLIPIDEMIA (11/24/2008), Insomnia (02/2019), Left bundle branch block (02/22/2019), Lesion of lip (10/2019), Pneumonia (10/22/2011), Suicidal ideations (02/2019), and Vitamin D deficiency (01/2019).  HPI   Outpatient Encounter Medications as of 05/07/2023  Medication Sig   acetaminophen (TYLENOL) 500 MG tablet Take 1 tablet (500 mg total) by mouth every 8 (eight) hours as needed for mild pain.   ASPIRIN LOW DOSE 81 MG tablet Take 1 tablet (81 mg total) by mouth daily.   calcitRIOL (ROCALTROL) 0.25 MCG capsule Take 1 capsule (0.25 mcg total) by mouth every Tuesday, Thursday, and Saturday at 6 PM.   calcium acetate (PHOSLO) 667 MG tablet Take 3 tablets (2,001 mg total) by mouth 3 (three) times daily.   carvedilol (COREG) 12.5 MG tablet Take 1 tablet by mouth twice a day with meals   glucose blood test strip Use to check fasting blood sugar once daily. diag code E11.65. Insulin dependent   HUMALOG KWIKPEN 100 UNIT/ML KwikPen Inject into the skin.   hydrALAZINE (APRESOLINE) 25 MG tablet Take 1 tablet (25 mg total) by mouth 3 (three) times daily.   insulin aspart (NOVOLOG FLEXPEN) 100 UNIT/ML FlexPen 0-9 Units, Subcutaneous, 3 times daily with meals CBG < 70: Implement Hypoglycemia measures CBG 70 - 120: 0 units CBG 121 - 150: 1 unit CBG 151 - 200: 2 units CBG 201 - 250: 3 units CBG 251 - 300: 5 units CBG 301 - 350: 7 units CBG 351 - 400: 9 units CBG > 400: call MD   insulin glargine-yfgn (SEMGLEE) 100  UNIT/ML injection Inject 0.07 mLs (7 Units total) into the skin at bedtime.   Insulin Pen Needle 32G X 4 MM MISC Use with insulin pens   isosorbide mononitrate (IMDUR) 60 MG 24 hr tablet Take 1 tablet (60 mg total) by mouth daily.   melatonin 3 MG TABS tablet Take 1 tablet (3 mg total) by mouth at bedtime as needed (insomnia).   mupirocin ointment (BACTROBAN) 2 % Apply 1 Application topically 3 (three) times daily.   ondansetron (ZOFRAN-ODT) 4 MG disintegrating tablet Take 1 tablet (4 mg total) by mouth every 4 (four) hours as needed for nausea/vomiting.   PROTONIX 40 MG tablet Take 1 tablet (40 mg total) by mouth daily.   simethicone (MYLICON) 80 MG chewable tablet Chew 1 tablet (80 mg total) by mouth 4 (four) times daily for 5 days.   [DISCONTINUED] gabapentin (NEURONTIN) 300 MG capsule Take 1 capsule (300 mg total) by mouth 3 (three) times daily. (Patient not taking: Reported on 07/27/2019)   [DISCONTINUED] sildenafil (VIAGRA) 25 MG tablet Take 1 tablet (25 mg total) by mouth as needed for erectile dysfunction. for erectile dysfunction. (Patient not taking: Reported on 08/11/2020)   No facility-administered encounter medications on file as of 05/07/2023.    Past Surgical History:  Procedure Laterality Date   ARM SURGERY     AV FISTULA PLACEMENT Right 02/20/2021   Procedure: RIGHT ARTERIOVENOUS GRAFT CREATION;  Surgeon: Chuck Hint, MD;  Location: [facility] OR;  Service: Vascular;  Laterality: Right;  INSERTION OF DIALYSIS CATHETER Left 02/20/2021   Procedure: INSERTION OF DIALYSIS CATHETER USING PALINDROME CATHETER;  Surgeon: Chuck Hint, MD;  Location: [facility] OR;  Service: Vascular;  Laterality: Left;   IR AV DIALY SHUNT INTRO NEEDLE/INTRACATH INITIAL W/PTA/IMG RIGHT Right 11/06/2021   IR FLUORO GUIDE CV LINE RIGHT  09/12/2022   IR REMOVAL TUN CV CATH W/O FL  07/27/2021   IR THROMBECTOMY AV FISTULA W/THROMBOLYSIS INC/SHUNT/IMG RIGHT  09/11/2022   IR THROMBECTOMY AV FISTULA  W/THROMBOLYSIS/PTA/STENT INC/SHUNT/IMG RT Right 04/15/2023   IR US GUIDE VASC ACCESS RIGHT  11/06/2021   IR US GUIDE VASC ACCESS RIGHT  09/11/2022   IR US GUIDE VASC ACCESS RIGHT  09/12/2022   IR US GUIDE VASC ACCESS RIGHT  04/15/2023   TEE WITHOUT CARDIOVERSION N/A 10/25/2022   Procedure: TRANSESOPHAGEAL ECHOCARDIOGRAM;  Surgeon: Chilton Si, MD;  Location: [facility] INVASIVE CV LAB;  Service: Cardiovascular;  Laterality: N/A;   ULTRASOUND GUIDANCE FOR VASCULAR ACCESS  02/20/2021   Procedure: ULTRASOUND GUIDANCE FOR VASCULAR ACCESS;  Surgeon: Chuck Hint, MD;  Location: [facility] OR;  Service: Vascular;;    ROS    Objective    There were no vitals taken for this visit.  Physical Exam    Assessment & Plan:  There are no diagnoses linked to this encounter.  No follow-ups on file.   Cruzita Lederer Newman Nip, FNP

## 2023-05-06 NOTE — Patient Instructions (Signed)

## 2023-05-07 ENCOUNTER — Ambulatory Visit (INDEPENDENT_AMBULATORY_CARE_PROVIDER_SITE_OTHER): Payer: Self-pay | Admitting: Family Medicine

## 2023-05-07 DIAGNOSIS — E559 Vitamin D deficiency, unspecified: Secondary | ICD-10-CM

## 2023-05-07 DIAGNOSIS — E038 Other specified hypothyroidism: Secondary | ICD-10-CM

## 2023-05-07 DIAGNOSIS — Z1211 Encounter for screening for malignant neoplasm of colon: Secondary | ICD-10-CM

## 2023-05-07 DIAGNOSIS — Z136 Encounter for screening for cardiovascular disorders: Secondary | ICD-10-CM

## 2023-05-07 DIAGNOSIS — E1122 Type 2 diabetes mellitus with diabetic chronic kidney disease: Secondary | ICD-10-CM

## 2023-05-07 NOTE — Progress Notes (Signed)
NO SHOW

## 2023-05-08 ENCOUNTER — Other Ambulatory Visit: Payer: Self-pay | Admitting: Family Medicine

## 2023-05-08 ENCOUNTER — Telehealth: Payer: Self-pay | Admitting: Family Medicine

## 2023-05-08 ENCOUNTER — Emergency Department (HOSPITAL_COMMUNITY)
Admission: EM | Admit: 2023-05-08 | Discharge: 2023-05-21 | Disposition: E | Payer: 59 | Attending: Emergency Medicine | Admitting: Emergency Medicine

## 2023-05-08 DIAGNOSIS — I469 Cardiac arrest, cause unspecified: Secondary | ICD-10-CM | POA: Diagnosis not present

## 2023-05-08 DIAGNOSIS — Z794 Long term (current) use of insulin: Secondary | ICD-10-CM | POA: Diagnosis not present

## 2023-05-08 DIAGNOSIS — R0789 Other chest pain: Secondary | ICD-10-CM | POA: Diagnosis not present

## 2023-05-08 DIAGNOSIS — Z79899 Other long term (current) drug therapy: Secondary | ICD-10-CM | POA: Insufficient documentation

## 2023-05-08 DIAGNOSIS — Z7982 Long term (current) use of aspirin: Secondary | ICD-10-CM | POA: Diagnosis not present

## 2023-05-08 DIAGNOSIS — E119 Type 2 diabetes mellitus without complications: Secondary | ICD-10-CM | POA: Diagnosis not present

## 2023-05-08 DIAGNOSIS — I10 Essential (primary) hypertension: Secondary | ICD-10-CM | POA: Diagnosis not present

## 2023-05-08 LAB — CBG MONITORING, ED: Glucose-Capillary: 74 mg/dL (ref 70–99)

## 2023-05-08 MED ORDER — EPINEPHRINE 1 MG/10ML IJ SOSY
PREFILLED_SYRINGE | INTRAMUSCULAR | Status: AC
  Filled 2023-05-08: qty 10

## 2023-05-08 MED ORDER — SODIUM BICARBONATE 8.4 % IV SOLN
INTRAVENOUS | Status: DC | PRN
  Administered 2023-05-08: 50 meq via INTRAVENOUS

## 2023-05-08 MED ORDER — EPINEPHRINE 1 MG/10ML IJ SOSY
PREFILLED_SYRINGE | INTRAMUSCULAR | Status: DC | PRN
  Administered 2023-05-08 (×5): 1 mg via INTRAVENOUS

## 2023-05-21 NOTE — Telephone Encounter (Signed)
Urgent! I had E2C2 call center called needs for Tenna Child or MD over Tenna Child to return Dr Bethann Berkshire at [facility] ER about patient MRN [id number] ASAP, asked to call the ER and ask for Dr Bethann Berkshire, he will be waiting on a return call.

## 2023-05-21 NOTE — Progress Notes (Signed)
   04/24/2023 1250  Spiritual Encounters  Type of Visit Initial  Conversation partners present during encounter Nurse  Reason for visit Patient death  OnCall Visit No   Reason for Visit: Chaplain responding to call for ED secretary that there was a Pt death and chaplain would be needed to comfort family.  Time of Visit: 30 minutes  Description: Chaplain arrived in ED to comfort family but no family was present.  RN states that family has been contacted by no one has showed up yet.  Chaplain remained in Ed for 30 minutes waiting, but no family arrived.  Chaplain returned at 1500 and there was still no family present.  Plan of Care: At this time there is no family and the Pt is deceased.  No further spritual care needs at this time.

## 2023-05-21 NOTE — Telephone Encounter (Signed)
Dr Lodema Hong has spoken to ER.

## 2023-05-21 NOTE — Telephone Encounter (Signed)
Patient was never seen in office to establish care.

## 2023-05-21 NOTE — ED Notes (Signed)
Zammit, MD at bedside checking pulses, per MD the pt time of death remains as previously called d/t inability to palpate pulses at various sites

## 2023-05-21 NOTE — Code Documentation (Signed)
Pt lost pulses upon stopping the use of the Samuel Bouche, MD at beside, pt noted to be in PEA per MD, no pulses palpated per MD, no further intervention to be completed per MD

## 2023-05-21 NOTE — Progress Notes (Signed)
RT called to ED per CPR in progress coming in. Upon arrival to patients room patient was being bagged. No airway in place at that time. MD intubated patient with 7.5 ETT and a MAC 3 blade. After intubation patient was bagged until MD called code.

## 2023-05-21 NOTE — ED Notes (Addendum)
Voicemail left for Jacksyn Trainer at [phone] to return call to Estell Harpin, MD at [phone]

## 2023-05-21 NOTE — ED Provider Notes (Signed)
EMERGENCY DEPARTMENT AT [facility] Provider Note   CSN: [id number] Arrival date & time: 05/10/2023  1123     History  Chief Complaint  Patient presents with   Cardiac Arrest    [name] is a 53 y.o. male.  Patient has a history of hypertension and diabetes.  He is a dialysis patient but has not had dialysis for over a month.  He called EMS for shortness of breath and tightness in his chest.  They arrived there and while he was talking to them he had a cardiopulmonary arrest.  EMS started chest compressions and bagging the patient.  He did not regain a pulse with them.  They treated him with epinephrine.  When the patient arrived the emergency department he did not have a pulse or respiratory rate.  Also his pupils were fixed and moderately dilated  The history is provided by the EMS personnel. The history is limited by the condition of the patient. A language interpreter was used.  Cardiac Arrest Witnessed by:  Healthcare provider Incident location:  En route to the ED Time before BLS initiated:  Immediate Time before ALS initiated:  Immediate Condition upon EMS arrival:  Apneic Pulse:  Absent Treatments prior to arrival:  ACLS protocol Medications given prior to ED:  Epinephrine Airway:  Bag valve mask Rhythm on admission to ED:  Unchanged Risk factors: diabetes mellitus   Risk factors comment:  Hypertension and dialysis patient      Home Medications Prior to Admission medications   Medication Sig Start Date End Date Taking? Authorizing Provider  acetaminophen (TYLENOL) 500 MG tablet Take 1 tablet (500 mg total) by mouth every 8 (eight) hours as needed for mild pain. 05/28/22   Leroy Sea, MD  ASPIRIN LOW DOSE 81 MG tablet Take 1 tablet (81 mg total) by mouth daily. 05/28/22   Leroy Sea, MD  calcitRIOL (ROCALTROL) 0.25 MCG capsule Take 1 capsule (0.25 mcg total) by mouth every Tuesday, Thursday, and Saturday at 6 PM. 10/26/22   Ghimire,  Werner Lean, MD  calcium acetate (PHOSLO) 667 MG tablet Take 3 tablets (2,001 mg total) by mouth 3 (three) times daily. 03/06/23     carvedilol (COREG) 12.5 MG tablet Take 1 tablet by mouth twice a day with meals 03/06/23     glucose blood test strip Use to check fasting blood sugar once daily. 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 10/26/22   [provider]  hydrALAZINE (APRESOLINE) 25 MG tablet Take 1 tablet (25 mg total) by mouth 3 (three) times daily. 02/15/23   Zannie Cove, MD  insulin aspart (NOVOLOG FLEXPEN) 100 UNIT/ML FlexPen 0-9 Units, Subcutaneous, 3 times daily with meals CBG < 70: Implement Hypoglycemia measures CBG 70 - 120: 0 units CBG 121 - 150: 1 unit CBG 151 - 200: 2 units CBG 201 - 250: 3 units CBG 251 - 300: 5 units CBG 301 - 350: 7 units CBG 351 - 400: 9 units CBG > 400: call MD 10/26/22   Maretta Bees, MD  insulin glargine-yfgn (SEMGLEE) 100 UNIT/ML injection Inject 0.07 mLs (7 Units total) into the skin at bedtime. 10/26/22   Ghimire, Werner Lean, MD  Insulin Pen Needle 32G X 4 MM MISC Use with insulin pens 05/28/22   Leroy Sea, MD  isosorbide mononitrate (IMDUR) 60 MG 24 hr tablet Take 1 tablet (60 mg  total) by mouth daily. 10/26/22   Ghimire, Werner Lean, MD  melatonin 3 MG TABS tablet Take 1 tablet (3 mg total) by mouth at bedtime as needed (insomnia). 10/26/22   Ghimire, Werner Lean, MD  mupirocin ointment (BACTROBAN) 2 % Apply 1 Application topically 3 (three) times daily. 01/07/23   Mardella Layman, MD  ondansetron (ZOFRAN-ODT) 4 MG disintegrating tablet Take 1 tablet (4 mg total) by mouth every 4 (four) hours as needed for nausea/vomiting. 03/05/23   Melene Plan, DO  PROTONIX 40 MG tablet Take 1 tablet (40 mg total) by mouth daily. 05/25/22   Leroy Sea, MD  simethicone (MYLICON) 80 MG chewable tablet Chew 1 tablet (80 mg total) by mouth 4 (four) times daily for 5 days. 11/22/22  04/11/23  Rodolph Bong, MD  gabapentin (NEURONTIN) 300 MG capsule Take 1 capsule (300 mg total) by mouth 3 (three) times daily. Patient not taking: Reported on 07/27/2019 07/29/18 08/30/19  Kallie Locks, FNP  sildenafil (VIAGRA) 25 MG tablet Take 1 tablet (25 mg total) by mouth as needed for erectile dysfunction. for erectile dysfunction. Patient not taking: Reported on 08/11/2020 10/13/19 11/27/20  Kallie Locks, FNP      Allergies    Sulfa antibiotics, Other, Bactrim [sulfamethoxazole-trimethoprim], and Cephalexin    Review of Systems   Review of Systems  Unable to perform ROS: Acuity of condition    Physical Exam Updated Vital Signs There were no vitals taken for this visit. Physical Exam Vitals reviewed.  Constitutional:      General: He is in acute distress.     Appearance: He is well-developed.  HENT:     Head: Normocephalic.     Mouth/Throat:     Mouth: Mucous membranes are moist.  Eyes:     Comments: Both pupils were dilated and unresponsive to light  Neck:     Thyroid: No thyromegaly.  Cardiovascular:     Heart sounds: No murmur heard.    No friction rub. No gallop.     Comments: Patient did not have a pulse Pulmonary:     Breath sounds: No wheezing or rales.     Comments: Patient with bag.  Decreased lung sounds Abdominal:     General: There is no distension.     Tenderness: There is no abdominal tenderness. There is no rebound.  Musculoskeletal:        General: Swelling present.     Cervical back: Neck supple.  Lymphadenopathy:     Cervical: No cervical adenopathy.  Skin:    Findings: No erythema or rash.  Neurological:     Motor: No abnormal muscle tone.     Coordination: Coordination normal.     Comments: CPR with bagging the patient.  Patient having no neurological response     ED Results / Procedures / Treatments   Labs (all labs ordered are listed, but only abnormal results are displayed) Labs Reviewed  CBG MONITORING, ED     EKG None  Radiology No results found.  Procedures Procedure Name: Intubation Date/Time: 05/15/2023 3:25 PM  Performed by: Bethann Berkshire, MDPre-anesthesia Checklist: Patient identified Oxygen Delivery Method: Ambu bag Ventilation: Mask ventilation with difficulty Laryngoscope Size: 1 Grade View: Grade II Tube size: 7.5 mm Number of attempts: 2 Airway Equipment and Method: Stylet Placement Confirmation: ETT inserted through vocal cords under direct vision and Positive ETCO2 Difficulty Due To: Difficulty was anticipated Comments: Patient was having chest compressions and was being bagged.  He was intubated after a  second try with a 7-1/2 tube.        Medications Ordered in ED Medications  EPINEPHrine (ADRENALIN) 1 MG/10ML injection (1 mg Intravenous Given 05/19/2023 1133)  sodium bicarbonate injection (50 mEq Intravenous Given 04/25/2023 1131)  EPINEPHrine (ADRENALIN) 1 MG/10ML injection (has no administration in time range)    ED Course/ Medical Decision Making/ A&P    Appropriate ACLS was performed on the patient he was given 5 epinephrines in the emergency department.  He regained a pulse briefly and went into PEA.  Patient was intubated.  Patient was pronounced dead at 11:36 AM.  I rechecked his pulse again after that time and he did not have a femoral pulse on either side or carotid pulse on either side.  Patient did not have a primary care doctor.  The medical examiner was contacted and he called Dr. Marisue Humble and Dr. Marisue Humble will take care of death certificate.     CRITICAL CARE Performed by: Bethann Berkshire Total critical care time: 40 minutes Critical care time was exclusive of separately billable procedures and treating other patients. Critical care was necessary to treat or prevent imminent or life-threatening deterioration. Critical care was time spent personally by me on the following activities: development of treatment plan with patient and/or surrogate as well  as nursing, discussions with consultants, evaluation of patient's response to treatment, examination of patient, obtaining history from patient or surrogate, ordering and performing treatments and interventions, ordering and review of laboratory studies, ordering and review of radiographic studies, pulse oximetry and re-evaluation of patient's condition.  Click here for ABCD2, HEART and other calculatorsREFRESH Note before signing :1}                              Medical Decision Making Risk Prescription drug management.   Cardiopulmonary arrest.  Patient expired at 11:36 AM        Final Clinical Impression(s) / ED Diagnoses Final diagnoses:  Cardiopulmonary arrest [facility])    Rx / DC Orders ED Discharge Orders     None         Bethann Berkshire, MD 05/06/2023 1528

## 2023-05-21 NOTE — ED Triage Notes (Signed)
Pt in from private residence via [facility] EMS, per report the pt was last seen normal at 1am, pt found unresponsive by family, EMS reports the pt has not had HD x 1 mth, pt has access in R arm, pt reported to have agonal breathing upon EMS arrival, pt initially had pulse then was lost on the scene, compressions were initiated by EMS, pt received 1 mg Epi pta

## 2023-05-21 NOTE — ED Notes (Signed)
This RN attempted to call the pts next of kin x 2 [name] [phone], someone picked up and all that could be heard was that the person did not have in their headphones, This RN attempted to call the number back and the phone went to voicemail

## 2023-05-21 NOTE — ED Notes (Signed)
CSW received a call from [facility] with [facility] who stated that she was reaching out because she is assisting [employer] with patient case. CSW updated her, per chart, that patient passed away.

## 2023-05-21 NOTE — ED Notes (Signed)
Per Zammitt, MD he spoke with a male who identified herself as [name] [phone] the pts niece she is updated on the pt status, MD reports that she can come see the pt in the ED, charge RN requests the pt to be transported to the morgue, per [facility] Lamar Benes is not listed on the pts chart and will be unable to see the pt in the morgue, MD notified re: the plan and about contacting the niece about viewing the pt, Zammit, MD reports that the pt can stay in the ED if that is the rule per the [facility] for the morgue, he states, "People view bodies in the ER all the time that are not on the chart." Pascal Lux RN notified who updated the [facility], per charge RN the pt is to be transported to the morgue

## 2023-05-21 DEATH — deceased

## 2023-09-10 NOTE — Congregational Nurse Program (Signed)
 Assisted pt by way of his family member [place] [facility].) to attempt to help the patient with coordinating A PCP  as it relates so he can seek help with getting dialysis treatment
